# Patient Record
Sex: Female | Born: 1955
Health system: Southern US, Community
[De-identification: ages and names within clinical notes are randomized; demographics above are authoritative.]

## PROBLEM LIST (undated history)

## (undated) DIAGNOSIS — J3089 Other allergic rhinitis: Secondary | ICD-10-CM

## (undated) DIAGNOSIS — I2510 Atherosclerotic heart disease of native coronary artery without angina pectoris: Secondary | ICD-10-CM

## (undated) DIAGNOSIS — D151 Benign neoplasm of heart: Secondary | ICD-10-CM

## (undated) DIAGNOSIS — Z952 Presence of prosthetic heart valve: Secondary | ICD-10-CM

## (undated) DIAGNOSIS — E785 Hyperlipidemia, unspecified: Secondary | ICD-10-CM

## (undated) DIAGNOSIS — K219 Gastro-esophageal reflux disease without esophagitis: Secondary | ICD-10-CM

## (undated) DIAGNOSIS — I10 Essential (primary) hypertension: Secondary | ICD-10-CM

## (undated) DIAGNOSIS — F419 Anxiety disorder, unspecified: Secondary | ICD-10-CM

## (undated) DIAGNOSIS — M1990 Unspecified osteoarthritis, unspecified site: Secondary | ICD-10-CM

## (undated) DIAGNOSIS — M2550 Pain in unspecified joint: Secondary | ICD-10-CM

## (undated) DIAGNOSIS — F32A Depression, unspecified: Secondary | ICD-10-CM

## (undated) DIAGNOSIS — E119 Type 2 diabetes mellitus without complications: Secondary | ICD-10-CM

## (undated) DIAGNOSIS — F329 Major depressive disorder, single episode, unspecified: Secondary | ICD-10-CM

## (undated) DIAGNOSIS — H269 Unspecified cataract: Secondary | ICD-10-CM

## (undated) DIAGNOSIS — R32 Unspecified urinary incontinence: Secondary | ICD-10-CM

## (undated) DIAGNOSIS — Z951 Presence of aortocoronary bypass graft: Secondary | ICD-10-CM

## (undated) HISTORY — DX: Benign neoplasm of heart: D15.1

## (undated) HISTORY — DX: Depression, unspecified: F32.A

## (undated) HISTORY — DX: Atherosclerotic heart disease of native coronary artery without angina pectoris: I25.10

## (undated) HISTORY — PX: EYE SURGERY: SHX253

## (undated) HISTORY — DX: Major depressive disorder, single episode, unspecified: F32.9

## (undated) HISTORY — DX: Unspecified urinary incontinence: R32

## (undated) HISTORY — DX: Anxiety disorder, unspecified: F41.9

## (undated) HISTORY — DX: Pain in unspecified joint: M25.50

## (undated) HISTORY — DX: Presence of prosthetic heart valve: Z95.2

## (undated) HISTORY — DX: Hyperlipidemia, unspecified: E78.5

## (undated) HISTORY — DX: Presence of aortocoronary bypass graft: Z95.1

## (undated) HISTORY — DX: Unspecified cataract: H26.9

## (undated) HISTORY — PX: DIAGNOSTIC LAPAROSCOPY: SUR761

## (undated) HISTORY — DX: Essential (primary) hypertension: I10

---

## 1997-12-14 HISTORY — PX: BREAST EXCISIONAL BIOPSY: SUR124

## 1998-12-11 ENCOUNTER — Encounter: Payer: Self-pay | Admitting: *Deleted

## 1998-12-11 ENCOUNTER — Ambulatory Visit (HOSPITAL_BASED_OUTPATIENT_CLINIC_OR_DEPARTMENT_OTHER): Admission: RE | Admit: 1998-12-11 | Discharge: 1998-12-11 | Payer: Self-pay | Admitting: *Deleted

## 2003-12-15 HISTORY — PX: BREAST LUMPECTOMY: SHX2

## 2007-09-15 ENCOUNTER — Ambulatory Visit: Payer: Self-pay | Admitting: Gastroenterology

## 2010-11-05 ENCOUNTER — Ambulatory Visit: Payer: Self-pay | Admitting: Vascular Surgery

## 2010-11-19 ENCOUNTER — Ambulatory Visit: Payer: Self-pay

## 2011-01-06 ENCOUNTER — Ambulatory Visit: Payer: Self-pay | Admitting: Ophthalmology

## 2011-02-02 ENCOUNTER — Ambulatory Visit: Payer: Self-pay | Admitting: Ophthalmology

## 2011-11-16 ENCOUNTER — Ambulatory Visit: Payer: Self-pay

## 2011-12-15 ENCOUNTER — Ambulatory Visit: Payer: Self-pay

## 2011-12-15 HISTORY — PX: CATARACT EXTRACTION: SUR2

## 2011-12-23 ENCOUNTER — Ambulatory Visit: Payer: Self-pay | Admitting: Family Medicine

## 2012-12-12 ENCOUNTER — Encounter: Payer: Self-pay | Admitting: Physician Assistant

## 2012-12-12 LAB — HM COLONOSCOPY

## 2013-02-16 ENCOUNTER — Ambulatory Visit: Payer: Self-pay | Admitting: Family Medicine

## 2013-07-12 ENCOUNTER — Other Ambulatory Visit: Payer: Self-pay | Admitting: Physician Assistant

## 2013-07-14 LAB — URINE CULTURE

## 2013-09-06 ENCOUNTER — Ambulatory Visit: Payer: Self-pay | Admitting: Family Medicine

## 2013-09-06 LAB — COMPREHENSIVE METABOLIC PANEL
Albumin: 4.1 g/dL (ref 3.4–5.0)
Alkaline Phosphatase: 67 U/L (ref 50–136)
Anion Gap: 6 — ABNORMAL LOW (ref 7–16)
BUN: 20 mg/dL — ABNORMAL HIGH (ref 7–18)
Bilirubin,Total: 0.5 mg/dL (ref 0.2–1.0)
Calcium, Total: 9.2 mg/dL (ref 8.5–10.1)
Chloride: 102 mmol/L (ref 98–107)
Co2: 27 mmol/L (ref 21–32)
Creatinine: 0.8 mg/dL (ref 0.60–1.30)
EGFR (African American): >60
EGFR (Non-African Amer.): >60
Glucose: 123 mg/dL — ABNORMAL HIGH (ref 65–99)
Osmolality: 274 (ref 275–301)
Potassium: 4.4 mmol/L (ref 3.5–5.1)
SGOT(AST): 25 U/L (ref 15–37)
SGPT (ALT): 22 U/L (ref 12–78)
Sodium: 135 mmol/L — ABNORMAL LOW (ref 136–145)
Total Protein: 8.1 g/dL (ref 6.4–8.2)

## 2013-09-06 LAB — LIPID PANEL
Cholesterol: 205 mg/dL — ABNORMAL HIGH (ref 0–200)
HDL Cholesterol: 53 mg/dL (ref 40–60)
Ldl Cholesterol, Calc: 117 mg/dL — ABNORMAL HIGH (ref 0–100)
Triglycerides: 174 mg/dL (ref 0–200)
VLDL Cholesterol, Calc: 35 mg/dL (ref 5–40)

## 2013-09-06 LAB — CK: CK, Total: 120 U/L (ref 21–215)

## 2013-09-06 LAB — HEMOGLOBIN A1C: Hemoglobin A1C: 6.7 % — ABNORMAL HIGH (ref 4.2–6.3)

## 2014-01-18 ENCOUNTER — Ambulatory Visit: Payer: Self-pay | Admitting: Family Medicine

## 2014-02-11 ENCOUNTER — Ambulatory Visit: Payer: Self-pay | Admitting: Family Medicine

## 2014-03-15 ENCOUNTER — Ambulatory Visit: Payer: Self-pay | Admitting: Family Medicine

## 2014-04-13 ENCOUNTER — Ambulatory Visit: Payer: Self-pay | Admitting: Family Medicine

## 2015-03-11 ENCOUNTER — Other Ambulatory Visit: Payer: Self-pay

## 2015-03-11 NOTE — Patient Outreach (Signed)
[facility]) Care Management  [place]  03/11/2015   [name] February 28, 1956 [id number]  Subjective: States blood sugars are generally 118-125mg /dl at 3am when she wakes up.    Objective: Patient did not bring meter.   Current Medications:  Current Outpatient Prescriptions  Medication Sig Dispense Refill  . aspirin 81 MG tablet Take 81 mg by mouth daily.    Marland Kitchen losartan (COZAAR) 25 MG tablet Take 25 mg by mouth daily.    . metFORMIN (GLUCOPHAGE) 1000 MG tablet Take 1,000 mg by mouth 2 (two) times daily with a meal.    . montelukast (SINGULAIR) 10 MG tablet Take 10 mg by mouth daily.    . pravastatin (PRAVACHOL) 40 MG tablet Take 40 mg by mouth daily.    . sertraline (ZOLOFT) 100 MG tablet Take 100 mg by mouth daily.     No current facility-administered medications for this visit.     Fall/Depression Screening: fell when she slipped on a glass of spilled liquid- she was running to get into the bathroom. No injury. Complained of initial pain in right hip when she fell- now it hurts in the right buttock cheek when she sits a certain way.   Assessment: BP 170/90 today and BP 162/80 on 01/23/15 and 178/86 on 11/15/14.  I have asked to to reschedule with [facility] the last time I saw her but she didn't .  Jay is trying to loose weight by eating smaller portions; she's dropped from 215.8 to 210.3lbs since my last visit with her on 01/23/15.   Plan: Instructed patient to follow up with [facility] or the hospital employee PA by March 29, 2015.   Gentry Fitz, RN, BA, [place] Direct Dial:  [phone]  Fax:  [phone] E-mail: [email or web address] [address] [place], [place]  [address]

## 2015-03-11 NOTE — Patient Instructions (Signed)
Hypertension Hypertension, commonly called high blood pressure, is when the force of blood pumping through your arteries is too strong. Your arteries are the blood vessels that carry blood from your heart throughout your body. A blood pressure reading consists of a higher number over a lower number, such as 110/72. The higher number (systolic) is the pressure inside your arteries when your heart pumps. The lower number (diastolic) is the pressure inside your arteries when your heart relaxes. Ideally you want your blood pressure below 120/80. Hypertension forces your heart to work harder to pump blood. Your arteries may become narrow or stiff. Having hypertension puts you at risk for heart disease, stroke, and other problems.  RISK FACTORS Some risk factors for high blood pressure are controllable. Others are not.  Risk factors you cannot control include:   Race. You may be at higher risk if you are African American.  Age. Risk increases with age.  Gender. Men are at higher risk than women before age 45 years. After age 65, women are at higher risk than men. Risk factors you can control include:  Not getting enough exercise or physical activity.  Being overweight.  Getting too much fat, sugar, calories, or salt in your diet.  Drinking too much alcohol. SIGNS AND SYMPTOMS Hypertension does not usually cause signs or symptoms. Extremely high blood pressure (hypertensive crisis) may cause headache, anxiety, shortness of breath, and nosebleed. DIAGNOSIS  To check if you have hypertension, your health care provider will measure your blood pressure while you are seated, with your arm held at the level of your heart. It should be measured at least twice using the same arm. Certain conditions can cause a difference in blood pressure between your right and left arms. A blood pressure reading that is higher than normal on one occasion does not mean that you need treatment. If one blood pressure reading  is high, ask your health care provider about having it checked again. TREATMENT  Treating high blood pressure includes making lifestyle changes and possibly taking medicine. Living a healthy lifestyle can help lower high blood pressure. You may need to change some of your habits. Lifestyle changes may include:  Following the DASH diet. This diet is high in fruits, vegetables, and whole grains. It is low in salt, red meat, and added sugars.  Getting at least 2 hours of brisk physical activity every week.  Losing weight if necessary.  Not smoking.  Limiting alcoholic beverages.  Learning ways to reduce stress. If lifestyle changes are not enough to get your blood pressure under control, your health care provider may prescribe medicine. You may need to take more than one. Work closely with your health care provider to understand the risks and benefits. HOME CARE INSTRUCTIONS  Have your blood pressure rechecked as directed by your health care provider.   Take medicines only as directed by your health care provider. Follow the directions carefully. Blood pressure medicines must be taken as prescribed. The medicine does not work as well when you skip doses. Skipping doses also puts you at risk for problems.   Do not smoke.   Monitor your blood pressure at home as directed by your health care provider. SEEK MEDICAL CARE IF:   You think you are having a reaction to medicines taken.  You have recurrent headaches or feel dizzy.  You have swelling in your ankles.  You have trouble with your vision. SEEK IMMEDIATE MEDICAL CARE IF:  You develop a severe headache or confusion.    You have unusual weakness, numbness, or feel faint.  You have severe chest or abdominal pain.  You vomit repeatedly.  You have trouble breathing. MAKE SURE YOU:   Understand these instructions.  Will watch your condition.  Will get help right away if you are not doing well or get worse. Document  Released: 11/30/2005 Document Revised: 04/16/2014 Document Reviewed: 09/22/2013 [facility] Patient Information 2015 [place], [place]. This information is not intended to replace advice given to you by your health care provider. Make sure you discuss any questions you have with your health care provider. Hypertension Hypertension is another name for high blood pressure. High blood pressure forces your heart to work harder to pump blood. A blood pressure reading has two numbers, which includes a higher number over a lower number (example: 110/72). HOME CARE   Have your blood pressure rechecked by your doctor.  Only take medicine as told by your doctor. Follow the directions carefully. The medicine does not work as well if you skip doses. Skipping doses also puts you at risk for problems.  Do not smoke.  Monitor your blood pressure at home as told by your doctor. GET HELP IF:  You think you are having a reaction to the medicine you are taking.  You have repeat headaches or feel dizzy.  You have puffiness (swelling) in your ankles.  You have trouble with your vision. GET HELP RIGHT AWAY IF:   You get a very bad headache and are confused.  You feel weak, numb, or faint.  You get chest or belly (abdominal) pain.  You throw up (vomit).  You cannot breathe very well. MAKE SURE YOU:   Understand these instructions.  Will watch your condition.  Will get help right away if you are not doing well or get worse. Document Released: 05/18/2008 Document Revised: 12/05/2013 Document Reviewed: 09/22/2013 [facility] Patient Information 2015 [place], [place]. This information is not intended to replace advice given to you by your health care provider. Make sure you discuss any questions you have with your health care provider.

## 2015-04-10 ENCOUNTER — Ambulatory Visit: Payer: Self-pay

## 2015-04-15 ENCOUNTER — Other Ambulatory Visit: Payer: Self-pay

## 2015-04-17 ENCOUNTER — Other Ambulatory Visit: Payer: Self-pay

## 2015-04-17 ENCOUNTER — Emergency Department
Admission: EM | Admit: 2015-04-17 | Discharge: 2015-04-17 | Disposition: A | Discharge status: Home / Self Care | Payer: 59 | Attending: Emergency Medicine | Admitting: Emergency Medicine

## 2015-04-17 DIAGNOSIS — R42 Dizziness and giddiness: Secondary | ICD-10-CM | POA: Diagnosis present

## 2015-04-17 DIAGNOSIS — Z880 Allergy status to penicillin: Secondary | ICD-10-CM | POA: Diagnosis not present

## 2015-04-17 DIAGNOSIS — Z79899 Other long term (current) drug therapy: Secondary | ICD-10-CM | POA: Diagnosis not present

## 2015-04-17 DIAGNOSIS — Z7982 Long term (current) use of aspirin: Secondary | ICD-10-CM | POA: Diagnosis not present

## 2015-04-17 DIAGNOSIS — I10 Essential (primary) hypertension: Secondary | ICD-10-CM | POA: Insufficient documentation

## 2015-04-17 LAB — BASIC METABOLIC PANEL
Anion gap: 7 (ref 5–15)
BUN: 16 mg/dL (ref 6–20)
CO2: 28 mmol/L (ref 22–32)
Calcium: 9.4 mg/dL (ref 8.9–10.3)
Chloride: 102 mmol/L (ref 101–111)
Creatinine, Ser: 0.63 mg/dL (ref 0.44–1.00)
GFR calc Af Amer: >60 mL/min (ref >60)
GFR calc non Af Amer: >60 mL/min (ref >60)
Glucose, Bld: 209 mg/dL — ABNORMAL HIGH (ref 65–99)
Potassium: 4.1 mmol/L (ref 3.5–5.1)
Sodium: 137 mmol/L (ref 135–145)

## 2015-04-17 LAB — GLUCOSE, CAPILLARY: Glucose-Capillary: 187 mg/dL — ABNORMAL HIGH (ref 70–99)

## 2015-04-17 LAB — TROPONIN I: Troponin I: <0.03 ng/mL (ref <0.031)

## 2015-04-17 LAB — CBC
HCT: 41.6 % (ref 35.0–47.0)
Hemoglobin: 14 g/dL (ref 12.0–16.0)
MCH: 28.2 pg (ref 26.0–34.0)
MCHC: 33.7 g/dL (ref 32.0–36.0)
MCV: 83.6 fL (ref 80.0–100.0)
Platelets: 231 10*3/uL (ref 150–440)
RBC: 4.98 MIL/uL (ref 3.80–5.20)
RDW: 12.8 % (ref 11.5–14.5)
WBC: 7.8 10*3/uL (ref 3.6–11.0)

## 2015-04-17 MED ORDER — HYDROCHLOROTHIAZIDE 25 MG PO TABS
25.0000 mg | ORAL_TABLET | Freq: Every day | ORAL | Status: DC
  Administered 2015-04-17: 25 mg via ORAL

## 2015-04-17 MED ORDER — HYDROCHLOROTHIAZIDE 25 MG PO TABS: 25.0000 mg | ORAL_TABLET | Freq: Every day | ORAL | Status: DC

## 2015-04-17 MED ORDER — HYDROCHLOROTHIAZIDE 25 MG PO TABS
ORAL_TABLET | ORAL | Status: AC
  Filled 2015-04-17: qty 1

## 2015-04-17 NOTE — ED Notes (Signed)
Pt states she has had some dizziness with slight headache "for a little while", states today she was at work and the dizziness became worse, states she went to employee health and was advised her BP was elevated, pt denies any cp or sob

## 2015-04-17 NOTE — ED Notes (Signed)
Says she felt dizzy.  Feels like it's from her blood pressure.  Says they changed her bp meds from losartin 25 mg increased to 50 mg.

## 2015-04-17 NOTE — ED Provider Notes (Signed)
[facility] Emergency Department Provider Note    Time seen: 1629  I have reviewed the triage vital signs and the nursing notes.   HISTORY  Chief Complaint Dizziness and Hypertension    HPI [name] is a 59 y.o. female who presents with dizziness on and off for the past week symptoms are mild. Patient states she feels like is from her elevated blood pressure. She recently increased her dose of Lorcet R 10 from 25-50 mg. She has had some dizziness and by dizziness she means a little off balance denies vertigo or lightheadedness. Activity seems to worsen her symptoms, nothing makes it better. Blood pressure has been gradually increasing.     Past Medical History  Diagnosis Date  . Hyperlipidemia   . Hypertension   . Depression   . Joint pain     as reported by patient  . Urinary incontinence     as stated by patient    There are no active problems to display for this patient.   Past Surgical History  Procedure Laterality Date  . Breast lumpectomy Right 2005    as reported by patient  . Cataract extraction  January 2013    Current Outpatient Rx  Name  Route  Sig  Dispense  Refill  . aspirin 81 MG tablet   Oral   Take 81 mg by mouth daily.         Marland Kitchen losartan (COZAAR) 25 MG tablet   Oral   Take 25 mg by mouth daily.         . metFORMIN (GLUCOPHAGE) 1000 MG tablet   Oral   Take 1,000 mg by mouth 2 (two) times daily with a meal.         . montelukast (SINGULAIR) 10 MG tablet   Oral   Take 10 mg by mouth daily.         . pravastatin (PRAVACHOL) 40 MG tablet   Oral   Take 40 mg by mouth daily.         . sertraline (ZOLOFT) 100 MG tablet   Oral   Take 100 mg by mouth daily.           Allergies Penicillins  No family history on file.  Social History History  Substance Use Topics  . Smoking status: Not on file  . Smokeless tobacco: Not on file  . Alcohol Use: Yes    Review of Systems Constitutional:  Negative for fever. Eyes: Negative for visual changes. ENT: Negative for sore throat. Cardiovascular: Negative for chest pain. Respiratory: Negative for shortness of breath. Gastrointestinal: Negative for abdominal pain, vomiting and diarrhea. Genitourinary: Negative for dysuria. Musculoskeletal: Negative for back pain. Skin: Negative for rash. Neurological: Negative for headaches, focal weakness or numbness. Positive for dizziness  10-point ROS otherwise negative.  ____________________________________________   PHYSICAL EXAM:  VITAL SIGNS: ED Triage Vitals  Enc Vitals Group     BP 04/17/15 1206 221/95 mmHg     Pulse Rate 04/17/15 1206 68     Resp 04/17/15 1621 20     Temp 04/17/15 1206 98 F (36.7 C)     Temp Source 04/17/15 1206 Oral     SpO2 04/17/15 1206 100 %     Weight --      Height 04/17/15 1206 5\' 6"  (1.676 m)     Head Cir --      Peak Flow --      Pain Score 04/17/15 1215 0  Pain Loc --      Pain Edu? --      Excl. in [place]? --     Constitutional: Alert and oriented. Well appearing and in no distress. Eyes: Conjunctivae are normal. PERRL. Normal extraocular movements. ENT   Head: Normocephalic and atraumatic.   Nose: No congestion/rhinnorhea.   Mouth/Throat: Mucous membranes are moist.   Neck: No stridor. Hematological/Lymphatic/Immunilogical: No cervical lymphadenopathy. Cardiovascular: Normal rate, regular rhythm. Normal and symmetric distal pulses are present in all extremities. No murmurs, rubs, or gallops. Respiratory: Normal respiratory effort without tachypnea nor retractions. Breath sounds are clear and equal bilaterally. No wheezes/rales/rhonchi. Gastrointestinal: Soft and nontender. No distention. No abdominal bruits. There is no CVA tenderness. Musculoskeletal: Nontender with normal range of motion in all extremities. No joint effusions.  No lower extremity tenderness nor edema. Neurologic:  Normal speech and language. No gross focal  neurologic deficits are appreciated. Speech is normal. No gait instability. Skin:  Skin is warm, dry and intact. No rash noted. Psychiatric: Mood and affect are normal. Speech and behavior are normal. Patient exhibits appropriate insight and judgment.  ____________________________________________    LABS (pertinent positives/negatives)  Blood glucose 187 otherwise labs are normal  ____________________________________________  EKG normal EKG  RADIOLOGY  None  ____________________________________________    ED COURSE  Pertinent labs & imaging results that were available during my care of the patient were reviewed by me and considered in my medical decision making (see chart for details).  Patient hypertensive here, will add HCTZ  FINAL ASSESSMENT AND PLAN  Hypertension and dizziness  Plan: I'll add HCTZ 25 mg to her antihypertensive regimen. She'll continue on losartan 50 mg and follow up with her doctor in the next week for reevaluation.     Earleen Newport, MD   Earleen Newport, MD 04/17/15 [id number]

## 2015-04-17 NOTE — ED Notes (Signed)
Dr Jimmye Norman aware of blood pressure.  Pt can go.   Pt agrees to call pcp in am to inform of situation.

## 2015-04-17 NOTE — Discharge Instructions (Signed)
Dizziness  Dizziness means you feel unsteady or lightheaded. You might feel like you are going to pass out (faint). HOME CARE   Drink enough fluids to keep your pee (urine) clear or pale yellow.  Take your medicines exactly as told by your doctor. If you take blood pressure medicine, always stand up slowly from the lying or sitting position. Hold on to something to steady yourself.  If you need to stand in one place for a long time, move your legs often. Tighten and relax your leg muscles.  Have someone stay with you until you feel okay.  Do not drive or use heavy machinery if you feel dizzy.  Do not drink alcohol. GET HELP RIGHT AWAY IF:   You feel dizzy or lightheaded and it gets worse.  You feel sick to your stomach (nauseous), or you throw up (vomit).  You have trouble talking or walking.  You feel weak or have trouble using your arms, hands, or legs.  You cannot think clearly or have trouble forming sentences.  You have chest pain, belly (abdominal) pain, sweating, or you are short of breath.  Your vision changes.  You are bleeding.  You have problems from your medicine that seem to be getting worse. MAKE SURE YOU:   Understand these instructions.  Will watch your condition.  Will get help right away if you are not doing well or get worse. Document Released: 11/19/2011 Document Revised: 02/22/2012 Document Reviewed: 11/19/2011 [facility] Patient Information 2015 [place], [place]. This information is not intended to replace advice given to you by your health care provider. Make sure you discuss any questions you have with your health care provider.  Hypertension Hypertension is another name for high blood pressure. High blood pressure forces your heart to work harder to pump blood. A blood pressure reading has two numbers, which includes a higher number over a lower number (example: 110/72). HOME CARE   Have your blood pressure rechecked by your doctor.  Only take  medicine as told by your doctor. Follow the directions carefully. The medicine does not work as well if you skip doses. Skipping doses also puts you at risk for problems.  Do not smoke.  Monitor your blood pressure at home as told by your doctor. GET HELP IF:  You think you are having a reaction to the medicine you are taking.  You have repeat headaches or feel dizzy.  You have puffiness (swelling) in your ankles.  You have trouble with your vision. GET HELP RIGHT AWAY IF:   You get a very bad headache and are confused.  You feel weak, numb, or faint.  You get chest or belly (abdominal) pain.  You throw up (vomit).  You cannot breathe very well. MAKE SURE YOU:   Understand these instructions.  Will watch your condition.  Will get help right away if you are not doing well or get worse. Document Released: 05/18/2008 Document Revised: 12/05/2013 Document Reviewed: 09/22/2013 [facility] Patient Information 2015 [place], [place]. This information is not intended to replace advice given to you by your health care provider. Make sure you discuss any questions you have with your health care provider.

## 2015-04-18 ENCOUNTER — Other Ambulatory Visit: Payer: Self-pay

## 2015-04-18 NOTE — Patient Outreach (Signed)
[facility]) Care Management  04/18/2015  [name] 06/25/1956 [id number]   [name] stopped by to see me after going to the pharmacy.  She was put out of work yesterday after she complained of being dizzy and her BP was 200 something over 100+.  She saw her MD and will see her again tomorrow.  I will schedule to follow up with Helene Kelp next month.    Gentry Fitz, RN, BA, [place], [place] Direct Dial:  [phone]  Fax:  [phone] E-mail: [email or web address] [address], [place], [place]  [address]

## 2015-05-15 ENCOUNTER — Other Ambulatory Visit: Payer: Self-pay | Admitting: Family Medicine

## 2015-05-15 ENCOUNTER — Telehealth: Payer: Self-pay

## 2015-05-15 NOTE — Telephone Encounter (Signed)
LMTCB Emily Drozdowski, CMA  

## 2015-05-15 NOTE — Telephone Encounter (Signed)
Pt is returning your call.  ZV#[phone]/SW

## 2015-05-15 NOTE — Telephone Encounter (Signed)
Pt called because [facility] employee pharmacy would "not fill medication because it is too early". Called pharmacy to verify, states that insurance will not pay until 05/27/2015. Pharmacy states this medication will cost $6.21 out of pocket. LMTCB to inform pt. Renaldo Fiddler, CMA

## 2015-05-16 NOTE — Telephone Encounter (Signed)
Informed pt that Metformin is around $6.00 out of pocket. Pt agrees to pay the price. Renaldo Fiddler, CMA

## 2015-05-27 ENCOUNTER — Other Ambulatory Visit: Payer: Self-pay

## 2015-05-27 NOTE — Patient Outreach (Signed)
[facility]) Care Management  [place]  05/27/2015   [name] November 07, 1956 [id number]  Subjective:  Patient tells me of her recent visit to the ED for c/o dizziness.  She was started on HCTZ and Losartan increased to 50mg /day and was told to see her primary care MD- unsure whether this has been done.  She stopped her aspirin for an unknown reason.  Also needs a full physical. Patient continues to complain of still feeling dizzy and states BP are 190/90 but improve through the day, saying they are sometimes 140/60 in the afternoon.  She has a BP cuff at home and has compared it to employee health and it appears to be accurate.  Patient did not bring her meter and reports only checking blood sugars every other day.     Objective:  Filed Vitals:   05/27/15 1143  BP: 140/90   Patient is not keeping up with preventative care appointments. Needs a physical, dilated eye, dental and mammogram. She has broken her partial plate and needs it fixed.  She tells me she has a cataract in her left eye that needs to be removed. She tells me she thinks she can schedule these over the next few months.    Current Medications:  Current Outpatient Prescriptions  Medication Sig Dispense Refill  . ALPRAZolam (XANAX) 0.5 MG tablet Take 0.5 mg by mouth every 8 (eight) hours. 1/2 to 1 tab before work and q8h as needed    . hydrochlorothiazide (HYDRODIURIL) 25 MG tablet Take 1 tablet (25 mg total) by mouth daily. 30 tablet 1  . losartan (COZAAR) 25 MG tablet Take 50 mg by mouth daily.     . metFORMIN (GLUCOPHAGE) 1000 MG tablet Take 1,000 mg by mouth 2 (two) times daily with a meal.    . montelukast (SINGULAIR) 10 MG tablet Take 10 mg by mouth daily.    . naproxen sodium (ANAPROX) 220 MG tablet Take 220 mg by mouth as needed.    . pravastatin (PRAVACHOL) 40 MG tablet Take 40 mg by mouth daily.    . sertraline (ZOLOFT) 100 MG tablet Take 100 mg by mouth daily.    Marland Kitchen aspirin 81 MG  tablet Take 81 mg by mouth daily.     No current facility-administered medications for this visit.    Functional Status:  In your present state of health, do you have any difficulty performing the following activities: 05/27/2015  Hearing? N  Vision? N  Difficulty concentrating or making decisions? N  Walking or climbing stairs? N  Dressing or bathing? N  Doing errands, shopping? N    Fall/Depression Screening: PHQ 2/9 Scores 05/27/2015  PHQ - 2 Score 0    Plan: Schedule preventative care over the next few months.  Schedule visit with MD as soon as possible- needs BP meds assessed.  Some recent labs done in the ED but needs an A1C with her physical.  Recommended she sign up for My Chart to review her labs.   Education - hypertension   -diabetes eye exams   - why get your A1C checked  Follow up scheduled for August, 2016  Gentry Fitz, RN, [place], [place], [place]:  [phone]  Fax:  [phone] E-mail: [email or web address] [address] [place], [place]  [address]

## 2015-05-27 NOTE — Patient Instructions (Addendum)

## 2015-06-18 ENCOUNTER — Other Ambulatory Visit: Payer: Self-pay | Admitting: Psychiatry

## 2015-06-26 ENCOUNTER — Other Ambulatory Visit: Payer: Self-pay | Admitting: Physician Assistant

## 2015-06-26 DIAGNOSIS — I10 Essential (primary) hypertension: Secondary | ICD-10-CM

## 2015-06-26 MED ORDER — HYDROCHLOROTHIAZIDE 25 MG PO TABS: 25.0000 mg | ORAL_TABLET | Freq: Every day | ORAL | Status: DC

## 2015-06-26 NOTE — Telephone Encounter (Signed)
Rx refilled but needs appt before any more refills.

## 2015-06-26 NOTE — Telephone Encounter (Signed)
Pt called.  She was given a RX in the ER  hydrochlorothiazide  25 mg .  She is out and needs a refill.  She uses the pharmacy at [name] .  Her call back is  [phone]  Thanks, TP.

## 2015-06-27 NOTE — Telephone Encounter (Signed)
Patient advised as directed below. Patient verbalized understanding. Patient states she will call back for a follow up appointment.

## 2015-08-01 ENCOUNTER — Encounter: Payer: Self-pay | Admitting: Physician Assistant

## 2015-08-01 ENCOUNTER — Ambulatory Visit (INDEPENDENT_AMBULATORY_CARE_PROVIDER_SITE_OTHER): Payer: 59 | Admitting: Physician Assistant

## 2015-08-01 VITALS — BP 178/78 | HR 80 | Temp 98.2°F | Resp 16 | Wt 217.0 lb

## 2015-08-01 DIAGNOSIS — K5730 Diverticulosis of large intestine without perforation or abscess without bleeding: Secondary | ICD-10-CM | POA: Insufficient documentation

## 2015-08-01 DIAGNOSIS — F32A Depression, unspecified: Secondary | ICD-10-CM | POA: Insufficient documentation

## 2015-08-01 DIAGNOSIS — F329 Major depressive disorder, single episode, unspecified: Secondary | ICD-10-CM | POA: Insufficient documentation

## 2015-08-01 DIAGNOSIS — F430 Acute stress reaction: Secondary | ICD-10-CM | POA: Insufficient documentation

## 2015-08-01 DIAGNOSIS — E119 Type 2 diabetes mellitus without complications: Secondary | ICD-10-CM | POA: Insufficient documentation

## 2015-08-01 DIAGNOSIS — I10 Essential (primary) hypertension: Secondary | ICD-10-CM | POA: Insufficient documentation

## 2015-08-01 DIAGNOSIS — F419 Anxiety disorder, unspecified: Secondary | ICD-10-CM | POA: Diagnosis not present

## 2015-08-01 DIAGNOSIS — E7800 Pure hypercholesterolemia, unspecified: Secondary | ICD-10-CM

## 2015-08-01 DIAGNOSIS — E785 Hyperlipidemia, unspecified: Secondary | ICD-10-CM

## 2015-08-01 DIAGNOSIS — E559 Vitamin D deficiency, unspecified: Secondary | ICD-10-CM | POA: Insufficient documentation

## 2015-08-01 DIAGNOSIS — N393 Stress incontinence (female) (male): Secondary | ICD-10-CM | POA: Insufficient documentation

## 2015-08-01 DIAGNOSIS — E669 Obesity, unspecified: Secondary | ICD-10-CM | POA: Insufficient documentation

## 2015-08-01 DIAGNOSIS — N3281 Overactive bladder: Secondary | ICD-10-CM | POA: Insufficient documentation

## 2015-08-01 DIAGNOSIS — L299 Pruritus, unspecified: Secondary | ICD-10-CM | POA: Insufficient documentation

## 2015-08-01 DIAGNOSIS — G4700 Insomnia, unspecified: Secondary | ICD-10-CM | POA: Insufficient documentation

## 2015-08-01 MED ORDER — LOSARTAN POTASSIUM 25 MG PO TABS: 25.0000 mg | ORAL_TABLET | Freq: Two times a day (BID) | ORAL | Status: DC

## 2015-08-01 MED ORDER — HYDROCHLOROTHIAZIDE 25 MG PO TABS: 25.0000 mg | ORAL_TABLET | Freq: Every day | ORAL | Status: DC

## 2015-08-01 MED ORDER — METFORMIN HCL 1000 MG PO TABS: 1000.0000 mg | ORAL_TABLET | Freq: Two times a day (BID) | ORAL | Status: DC

## 2015-08-01 MED ORDER — ALPRAZOLAM 0.5 MG PO TABS: 0.5000 mg | ORAL_TABLET | Freq: Three times a day (TID) | ORAL | Status: DC

## 2015-08-01 NOTE — Progress Notes (Signed)
Subjective:     Patient ID: [name], female   DOB: [birth year], 59 y.o.   MRN: [id number]  HPI  Hypertension, follow-up:  BP Readings from Last 3 Encounters:  08/01/15 178/78  04/19/15 150/76  05/27/15 140/90    She was last seen for hypertension 2 months ago.  BP at that visit was as above. Management changes since that visit include increased losartan and then HCTZ 25mg  was added at ER visit. She reports good compliance with treatment. She is not having side effects.  She is not exercising. She is not adherent to low salt diet.   Outside blood pressures are not being recorded, but she states it will be high when she wakes up but comes down to normal after she takes her medications. She is experiencing none.  Patient denies chest pain, chest pressure/discomfort, claudication, dyspnea, exertional chest pressure/discomfort, irregular heart beat, lower extremity edema, near-syncope, orthopnea, palpitations, paroxysmal nocturnal dyspnea, syncope and tachypnea.   Cardiovascular risk factors include diabetes mellitus, dyslipidemia, hypertension and sedentary lifestyle.  Use of agents associated with hypertension: none.     Weight trend: stable Wt Readings from Last 3 Encounters:  08/01/15 217 lb (98.431 kg)  05/27/15 208 lb (94.348 kg)    Current diet: in general, an "unhealthy" diet  ------------------------------------------------------------------------     Review of Systems  Constitutional: Positive for fatigue.  HENT: Negative.   Eyes: Negative.   Respiratory: Negative.   Cardiovascular: Negative.   Gastrointestinal: Negative.   Endocrine: Negative.   Genitourinary: Negative.   Musculoskeletal: Negative.   Skin: Negative.   Allergic/Immunologic: Negative.   Neurological: Negative.   Hematological: Negative.   Psychiatric/Behavioral: The patient is nervous/anxious.        Objective:   Physical Exam  Constitutional: She appears well-developed and  well-nourished. No distress.  Cardiovascular: Normal rate, regular rhythm and normal heart sounds.  Exam reveals no gallop and no friction rub.   No murmur heard. Pulmonary/Chest: Effort normal and breath sounds normal. No respiratory distress. She has no wheezes. She has no rales.  Skin: She is not diaphoretic.  Vitals reviewed.      Assessment:     1. Essential hypertension   2. Type 2 diabetes mellitus without complication   3. Hypercholesterolemia without hypertriglyceridemia   4. Anxiety        Plan:     1. Essential hypertension Patient's blood pressure is still elevated today in the office. She does state that she does get good control at home when she takes her medication but does sometimes forget to take it every day. She states that when she wakes up in the morning her blood pressure will be elevated then she will take her medication when she rechecks her blood pressure it will come back to normal ranges. She has not kept a record of any of these numbers and did not bring any with her today. I did advise her to try to keep a log so that I could determine if her blood pressures were stable at home. Due to her blood pressure being elevated when she awakes I will change her losartan to 25 mg twice daily instead of 50 mg once. She is to continue the hydrochlorothiazide as prescribed 25 mg. I will also get labs today. I will see her back in approximately 4 weeks for her physical and we'll see how her blood pressures have been done. If they're still elevated will add another agent. - CBC with Differential - Comprehensive Metabolic Panel (  CMET) - hydrochlorothiazide (HYDRODIURIL) 25 MG tablet; Take 1 tablet (25 mg total) by mouth daily.  Dispense: 30 tablet; Refill: 6 - losartan (COZAAR) 25 MG tablet; Take 1 tablet (25 mg total) by mouth 2 (two) times daily.  Dispense: 60 tablet; Refill: 6  2. Type 2 diabetes mellitus without complication She is [facility]  for her diabetes. I will check her hemoglobin A1c today as a has not been checked in quite a while. She is to return in 4 weeks for her physical. At that time we will check her microalbumin and her diabetic foot exam. - HgB A1c - Comprehensive Metabolic Panel (CMET) - metFORMIN (GLUCOPHAGE) 1000 MG tablet; Take 1 tablet (1,000 mg total) by mouth 2 (two) times daily with a meal.  Dispense: 60 tablet; Refill: 6  3. Hypercholesterolemia without hypertriglyceridemia Will check labs. She will follow up in 4 weeks for her annual physical. Will add cholesterol-lowering medication if necessary. - Lipid panel  4. Anxiety Most related to work stress. She states she only takes half a pill when she needs it prior to work. Only on occasion will she take 1 whole pill. She also has some increased anxiety over her grandson starting kindergarten this year. Overall it is fairly stable. - TSH - ALPRAZolam (XANAX) 0.5 MG tablet; Take 1 tablet (0.5 mg total) by mouth every 8 (eight) hours. 1/2 to 1 tab before work and q8h as needed  Dispense: 30 tablet; Refill: 1

## 2015-08-01 NOTE — Patient Instructions (Signed)
Hypertension °Hypertension, commonly called high blood pressure, is when the force of blood pumping through your arteries is too strong. Your arteries are the blood vessels that carry blood from your heart throughout your body. A blood pressure reading consists of a higher number over a lower number, such as 110/72. The higher number (systolic) is the pressure inside your arteries when your heart pumps. The lower number (diastolic) is the pressure inside your arteries when your heart relaxes. Ideally you want your blood pressure below 120/80. °Hypertension forces your heart to work harder to pump blood. Your arteries may become narrow or stiff. Having hypertension puts you at risk for heart disease, stroke, and other problems.  °RISK FACTORS °Some risk factors for high blood pressure are controllable. Others are not.  °Risk factors you cannot control include:  °· Race. You may be at higher risk if you are African American. °· Age. Risk increases with age. °· Gender. Men are at higher risk than women before age 45 years. After age 65, women are at higher risk than men. °Risk factors you can control include: °· Not getting enough exercise or physical activity. °· Being overweight. °· Getting too much fat, sugar, calories, or salt in your diet. °· Drinking too much alcohol. °SIGNS AND SYMPTOMS °Hypertension does not usually cause signs or symptoms. Extremely high blood pressure (hypertensive crisis) may cause headache, anxiety, shortness of breath, and nosebleed. °DIAGNOSIS  °To check if you have hypertension, your health care provider will measure your blood pressure while you are seated, with your arm held at the level of your heart. It should be measured at least twice using the same arm. Certain conditions can cause a difference in blood pressure between your right and left arms. A blood pressure reading that is higher than normal on one occasion does not mean that you need treatment. If one blood pressure reading  is high, ask your health care provider about having it checked again. °TREATMENT  °Treating high blood pressure includes making lifestyle changes and possibly taking medicine. Living a healthy lifestyle can help lower high blood pressure. You may need to change some of your habits. °Lifestyle changes may include: °· Following the DASH diet. This diet is high in fruits, vegetables, and whole grains. It is low in salt, red meat, and added sugars. °· Getting at least 2½ hours of brisk physical activity every week. °· Losing weight if necessary. °· Not smoking. °· Limiting alcoholic beverages. °· Learning ways to reduce stress. ° If lifestyle changes are not enough to get your blood pressure under control, your health care provider may prescribe medicine. You may need to take more than one. Work closely with your health care provider to understand the risks and benefits. °HOME CARE INSTRUCTIONS °· Have your blood pressure rechecked as directed by your health care provider.   °· Take medicines only as directed by your health care provider. Follow the directions carefully. Blood pressure medicines must be taken as prescribed. The medicine does not work as well when you skip doses. Skipping doses also puts you at risk for problems.   °· Do not smoke.   °· Monitor your blood pressure at home as directed by your health care provider.  °SEEK MEDICAL CARE IF:  °· You think you are having a reaction to medicines taken. °· You have recurrent headaches or feel dizzy. °· You have swelling in your ankles. °· You have trouble with your vision. °SEEK IMMEDIATE MEDICAL CARE IF: °· You develop a severe headache or confusion. °·   You have unusual weakness, numbness, or feel faint. °· You have severe chest or abdominal pain. °· You vomit repeatedly. °· You have trouble breathing. °MAKE SURE YOU:  °· Understand these instructions. °· Will watch your condition. °· Will get help right away if you are not doing well or get worse. °Document  Released: 11/30/2005 Document Revised: 04/16/2014 Document Reviewed: 09/22/2013 °ExitCare® Patient Information ©2015 [employer]. This information is not intended to replace advice given to you by your health care provider. Make sure you discuss any questions you have with your health care provider. ° °Managing Your High Blood Pressure °Blood pressure is a measurement of how forceful your blood is pressing against the walls of the arteries. Arteries are muscular tubes within the circulatory system. Blood pressure does not stay the same. Blood pressure rises when you are active, excited, or nervous; and it lowers during sleep and relaxation. If the numbers measuring your blood pressure stay above normal most of the time, you are at risk for health problems. High blood pressure (hypertension) is a long-term (chronic) condition in which blood pressure is elevated. °A blood pressure reading is recorded as two numbers, such as 120 over 80 (or 120/80). The first, higher number is called the systolic pressure. It is a measure of the pressure in your arteries as the heart beats. The second, lower number is called the diastolic pressure. It is a measure of the pressure in your arteries as the heart relaxes between beats.  °Keeping your blood pressure in a normal range is important to your overall health and prevention of health problems, such as heart disease and stroke. When your blood pressure is uncontrolled, your heart has to work harder than normal. High blood pressure is a very common condition in adults because blood pressure tends to rise with age. Men and women are equally likely to have hypertension but at different times in life. Before age 45, men are more likely to have hypertension. After 61 years of age, women are more likely to have it. Hypertension is especially common in African Americans. This condition often has no signs or symptoms. The cause of the condition is usually not known. Your caregiver can  help you come up with a plan to keep your blood pressure in a normal, healthy range. °BLOOD PRESSURE STAGES °Blood pressure is classified into four stages: normal, prehypertension, stage 1, and stage 2. Your blood pressure reading will be used to determine what type of treatment, if any, is necessary. Appropriate treatment options are tied to these four stages:  °Normal °· Systolic pressure (mm Hg): below 120. °· Diastolic pressure (mm Hg): below 80. °Prehypertension °· Systolic pressure (mm Hg): 120 to 139. °· Diastolic pressure (mm Hg): 80 to 89. °Stage 1 °· Systolic pressure (mm Hg): 140 to 159. °· Diastolic pressure (mm Hg): 90 to 99. °Stage 2 °· Systolic pressure (mm Hg): 160 or above. °· Diastolic pressure (mm Hg): 100 or above. °RISKS RELATED TO HIGH BLOOD PRESSURE °Managing your blood pressure is an important responsibility. Uncontrolled high blood pressure can lead to: °· A heart attack. °· A stroke. °· A weakened blood vessel (aneurysm). °· Heart failure. °· Kidney damage. °· Eye damage. °· Metabolic syndrome. °· Memory and concentration problems. °HOW TO MANAGE YOUR BLOOD PRESSURE °Blood pressure can be managed effectively with lifestyle changes and medicines (if needed). Your caregiver will help you come up with a plan to bring your blood pressure within a normal range. Your plan should include the following: °Education °·   Read all information provided by your caregivers about how to control blood pressure. °· Educate yourself on the latest guidelines and treatment recommendations. New research is always being done to further define the risks and treatments for high blood pressure. °Lifestyle changes °· Control your weight. °· Avoid smoking. °· Stay physically active. °· Reduce the amount of salt in your diet. °· Reduce stress. °· Control any chronic conditions, such as high cholesterol or diabetes. °· Reduce your alcohol intake. °Medicines °· Several medicines (antihypertensive medicines) are available,  if needed, to bring blood pressure within a normal range. °Communication °· Review all the medicines you take with your caregiver because there may be side effects or interactions. °· Talk with your caregiver about your diet, exercise habits, and other lifestyle factors that may be contributing to high blood pressure. °· See your caregiver regularly. Your caregiver can help you create and adjust your plan for managing high blood pressure. °RECOMMENDATIONS FOR TREATMENT AND FOLLOW-UP  °The following recommendations are based on current guidelines for managing high blood pressure in nonpregnant adults. Use these recommendations to identify the proper follow-up period or treatment option based on your blood pressure reading. You can discuss these options with your caregiver. °· Systolic pressure of 120 to 139 or diastolic pressure of 80 to 89: Follow up with your caregiver as directed. °· Systolic pressure of 140 to 160 or diastolic pressure of 90 to 100: Follow up with your caregiver within 2 months. °· Systolic pressure above 160 or diastolic pressure above 100: Follow up with your caregiver within 1 month. °· Systolic pressure above 180 or diastolic pressure above 110: Consider antihypertensive therapy; follow up with your caregiver within 1 week. °· Systolic pressure above 200 or diastolic pressure above 120: Begin antihypertensive therapy; follow up with your caregiver within 1 week. °Document Released: 08/24/2012 Document Reviewed: 08/24/2012 °ExitCare® Patient Information ©2015 [employer]. This information is not intended to replace advice given to you by your health care provider. Make sure you discuss any questions you have with your health care provider. ° °

## 2015-08-05 ENCOUNTER — Ambulatory Visit: Payer: 59

## 2015-08-05 ENCOUNTER — Other Ambulatory Visit: Payer: Self-pay

## 2015-08-05 NOTE — Patient Outreach (Signed)
[facility] [address]) Care Management  08/05/2015  MALALA TRENKAMP 07-07-1956 [id number]     Left message on the patient's phone to call me and schedule her missed appointment.   Gentry Fitz, RN, BA, [place], [place] Direct Dial:  [phone]  Fax:  [phone] E-mail: [email or web address] [address], [place], [place]  [address]

## 2015-08-05 NOTE — Patient Outreach (Signed)
[facility]) Care Management  08/05/2015  [name] 06-06-1956 [id number]   Spoke to Helene Kelp- she went to see her MD last week and has a lab slip for an A1C which she will get tomorrow.  She has a scheduled appointment with her MD on September 1 for a full physical- she will get a referral for a mammogram at that time.   I have scheduled her to see me on 09/11/15 and have notified the patient.  Gentry Fitz, RN, BA, [place], [place] Direct Dial:  [phone]  Fax:  [phone] E-mail: [email or web address] [address], [place], [place]  [address]

## 2015-08-05 NOTE — Patient Outreach (Signed)
[facility]) Care Management  08/05/2015  [name] Dec 29, 1955 [id number]   Patient did not show for her scheduled visit.   Gentry Fitz, RN, BA, [place], [place] Direct Dial:  [phone]  Fax:  [phone] E-mail: [name] [address] [place], [place]  [address]

## 2015-08-06 ENCOUNTER — Other Ambulatory Visit
Admission: RE | Admit: 2015-08-06 | Discharge: 2015-08-06 | Disposition: A | Discharge status: Home / Self Care | Payer: 59 | Source: Ambulatory Visit | Attending: Physician Assistant | Admitting: Physician Assistant

## 2015-08-06 DIAGNOSIS — I10 Essential (primary) hypertension: Secondary | ICD-10-CM | POA: Insufficient documentation

## 2015-08-06 DIAGNOSIS — E119 Type 2 diabetes mellitus without complications: Secondary | ICD-10-CM | POA: Diagnosis present

## 2015-08-06 DIAGNOSIS — E780 Pure hypercholesterolemia: Secondary | ICD-10-CM | POA: Diagnosis not present

## 2015-08-06 DIAGNOSIS — F419 Anxiety disorder, unspecified: Secondary | ICD-10-CM | POA: Diagnosis present

## 2015-08-06 LAB — COMPREHENSIVE METABOLIC PANEL
ALT: 17 U/L (ref 14–54)
AST: 26 U/L (ref 15–41)
Albumin: 4.4 g/dL (ref 3.5–5.0)
Alkaline Phosphatase: 55 U/L (ref 38–126)
Anion gap: 8 (ref 5–15)
BUN: 16 mg/dL (ref 6–20)
CO2: 29 mmol/L (ref 22–32)
Calcium: 9.4 mg/dL (ref 8.9–10.3)
Chloride: 99 mmol/L — ABNORMAL LOW (ref 101–111)
Creatinine, Ser: 0.78 mg/dL (ref 0.44–1.00)
GFR calc Af Amer: >60 mL/min (ref >60)
GFR calc non Af Amer: >60 mL/min (ref >60)
Glucose, Bld: 188 mg/dL — ABNORMAL HIGH (ref 65–99)
Potassium: 4.5 mmol/L (ref 3.5–5.1)
Sodium: 136 mmol/L (ref 135–145)
Total Bilirubin: 0.4 mg/dL (ref 0.3–1.2)
Total Protein: 8.1 g/dL (ref 6.5–8.1)

## 2015-08-06 LAB — LIPID PANEL
Cholesterol: 243 mg/dL — ABNORMAL HIGH (ref 0–200)
HDL: 48 mg/dL (ref >40)
LDL Cholesterol: 152 mg/dL — ABNORMAL HIGH (ref 0–99)
Total CHOL/HDL Ratio: 5.1 RATIO
Triglycerides: 214 mg/dL — ABNORMAL HIGH (ref <150)
VLDL: 43 mg/dL — ABNORMAL HIGH (ref 0–40)

## 2015-08-06 LAB — CBC WITH DIFFERENTIAL/PLATELET
Basophils Absolute: 0 10*3/uL (ref 0–0.1)
Basophils Relative: 1 %
Eosinophils Absolute: 0.2 10*3/uL (ref 0–0.7)
Eosinophils Relative: 4 %
HCT: 41.7 % (ref 35.0–47.0)
Hemoglobin: 14 g/dL (ref 12.0–16.0)
Lymphocytes Relative: 22 %
Lymphs Abs: 1.3 10*3/uL (ref 1.0–3.6)
MCH: 28.1 pg (ref 26.0–34.0)
MCHC: 33.7 g/dL (ref 32.0–36.0)
MCV: 83.4 fL (ref 80.0–100.0)
Monocytes Absolute: 0.5 10*3/uL (ref 0.2–0.9)
Monocytes Relative: 8 %
Neutro Abs: 4.1 10*3/uL (ref 1.4–6.5)
Neutrophils Relative %: 65 %
Platelets: 219 10*3/uL (ref 150–440)
RBC: 5 MIL/uL (ref 3.80–5.20)
RDW: 13.1 % (ref 11.5–14.5)
WBC: 6.2 10*3/uL (ref 3.6–11.0)

## 2015-08-06 LAB — TSH: TSH: 4.229 u[IU]/mL (ref 0.350–4.500)

## 2015-08-06 LAB — HEMOGLOBIN A1C: Hgb A1c MFr Bld: 8.6 % — ABNORMAL HIGH (ref 4.0–6.0)

## 2015-08-07 ENCOUNTER — Telehealth: Payer: Self-pay

## 2015-08-07 NOTE — Telephone Encounter (Signed)
LMTCB

## 2015-08-07 NOTE — Telephone Encounter (Signed)
-----   Message from Mar Daring, [place] sent at 08/07/2015  8:28 AM EDT ----- Can we schedule her for a f/u to discuss her labs.  Her HgBA1c is now increased to 8.6 and I need to add medication to get this level down.  We also need to discuss her cholesterol and possibly adjusting that as well.  Thanks.

## 2015-08-07 NOTE — Telephone Encounter (Signed)
Ok that works.  Thank you.

## 2015-08-07 NOTE — Telephone Encounter (Signed)
Patient advised as directed below. Patient states she has a follow up appointment scheduled for 08/15/15

## 2015-08-15 ENCOUNTER — Encounter: Payer: 59 | Admitting: Physician Assistant

## 2015-08-29 ENCOUNTER — Encounter: Payer: Self-pay | Admitting: Physician Assistant

## 2015-08-29 ENCOUNTER — Ambulatory Visit (INDEPENDENT_AMBULATORY_CARE_PROVIDER_SITE_OTHER): Payer: 59 | Admitting: Physician Assistant

## 2015-08-29 VITALS — BP 120/70 | HR 70 | Temp 98.1°F | Resp 16 | Ht 66.0 in | Wt 214.8 lb

## 2015-08-29 DIAGNOSIS — Z23 Encounter for immunization: Secondary | ICD-10-CM | POA: Diagnosis not present

## 2015-08-29 DIAGNOSIS — F329 Major depressive disorder, single episode, unspecified: Secondary | ICD-10-CM | POA: Diagnosis not present

## 2015-08-29 DIAGNOSIS — Z0000 Encounter for general adult medical examination without abnormal findings: Secondary | ICD-10-CM | POA: Diagnosis not present

## 2015-08-29 DIAGNOSIS — Z1239 Encounter for other screening for malignant neoplasm of breast: Secondary | ICD-10-CM

## 2015-08-29 DIAGNOSIS — F32A Depression, unspecified: Secondary | ICD-10-CM

## 2015-08-29 DIAGNOSIS — E7800 Pure hypercholesterolemia, unspecified: Secondary | ICD-10-CM

## 2015-08-29 MED ORDER — SERTRALINE HCL 100 MG PO TABS: 100.0000 mg | ORAL_TABLET | Freq: Every day | ORAL | Status: DC

## 2015-08-29 MED ORDER — PRAVASTATIN SODIUM 40 MG PO TABS: 40.0000 mg | ORAL_TABLET | Freq: Every day | ORAL | Status: DC

## 2015-08-29 NOTE — Progress Notes (Signed)
Patient: [name], Female    DOB: [birth year], 59 y.o.   MRN: [id number] Visit Date: 08/29/2015  Today's Provider: Mar Daring, PA-C   Chief Complaint  Patient presents with  . Annual Exam   Subjective:    Annual physical exam [name] is a 58 y.o. female who presents today for health maintenance and complete physical. She feels well. She reports exercising, sometimes. She reports she is sleeping poorly. She had a Pap smear last year which was normal. There is no family history of cervical or ovarian cancer. Mammogram last year was also normal there is no family history of breast cancer. Last colonoscopy was in 2008 and was to be repeated in 10 years. There is a positive family history of colon cancer in her father. She denies any bowel changes, melena or hematochezia.   Last PCP: 03/15/2014 Pap Smear: Normal, HPV Negative 03/15/14 Colonoscopy: 09/15/2007 Diverticulosis; Repeat 10 years Mammogram: 03/2014 Normal -----------------------------------------------------------------   Review of Systems  Constitutional: Negative.   HENT: Negative.   Eyes: Negative.   Respiratory: Negative.   Cardiovascular: Negative.   Gastrointestinal: Negative.   Endocrine: Negative.   Musculoskeletal: Positive for back pain and arthralgias.  Skin: Negative.   Allergic/Immunologic: Negative.   Neurological: Positive for dizziness.  Hematological: Negative.   Psychiatric/Behavioral: Positive for sleep disturbance and agitation.    Social History She  reports that she has quit smoking. Her smoking use included Cigarettes. She has a 37.5 pack-year smoking history. She has never used smokeless tobacco. She reports that she drinks alcohol. She reports that she does not use illicit drugs. Social History   Social History  . Marital Status: Widowed    Spouse Name: N/A  . Number of Children: N/A  . Years of Education: N/A   Social History Main Topics  . Smoking  status: Former Smoker -- 1.50 packs/day for 25 years    Types: Cigarettes  . Smokeless tobacco: Never Used  . Alcohol Use: 0.0 oz/week    0 Standard drinks or equivalent per week     Comment: 1- every 3-4 months  . Drug Use: No  . Sexual Activity: Not Asked   Other Topics Concern  . None   Social History Narrative    Patient Active Problem List   Diagnosis Date Noted  . Acute stress disorder 08/01/2015  . Anxiety 08/01/2015  . Clinical depression 08/01/2015  . Diabetes 08/01/2015  . Diverticulosis of colon 08/01/2015  . Generalized pruritus 08/01/2015  . BP (high blood pressure) 08/01/2015  . Cannot sleep 08/01/2015  . Adiposity 08/01/2015  . Detrusor muscle hypertonia 08/01/2015  . Hypercholesterolemia without hypertriglyceridemia 08/01/2015  . Female stress incontinence 08/01/2015  . Avitaminosis D 08/01/2015    Past Surgical History  Procedure Laterality Date  . Breast lumpectomy Right 2005    as reported by patient  . Cataract extraction  January 2013    Family History  Family Status  Relation Status Death Age  . Brother Alive   . Mother Deceased   . Father Deceased   . Maternal Grandmother Deceased   . Maternal Grandfather Deceased   . Paternal Grandmother Deceased   . Paternal Grandfather Deceased    Her family history includes Alzheimer's disease in her paternal grandmother; Aneurysm in her mother; CAD in her father; Colon cancer in her father; Dementia in her paternal grandmother; Diabetes in her father; Healthy in her brother; Heart failure in her father; Mental illness in her  paternal grandfather.    Allergies  Allergen Reactions  . Penicillins   . Tetanus-Diphtheria Toxoids Td     Previous Medications   ALPRAZOLAM (XANAX) 0.5 MG TABLET    Take 1 tablet (0.5 mg total) by mouth every 8 (eight) hours. 1/2 to 1 tab before work and q8h as needed   ASPIRIN 81 MG TABLET    Take 81 mg by mouth daily.   CALCIUM CARBONATE-VIT D-MIN PO    Take 1 tablet by  mouth daily.   CHOLECALCIFEROL (VITAMIN D) 2000 UNITS CAPS    Take 4 capsules by mouth daily.   HYDROCHLOROTHIAZIDE (HYDRODIURIL) 25 MG TABLET    Take 1 tablet (25 mg total) by mouth daily.   HYDROXYZINE (ATARAX/VISTARIL) 25 MG TABLET    Take 1 tablet by mouth 3 (three) times daily as needed.   LORATADINE (CLARITIN) 10 MG TABLET    Take 1 tablet by mouth daily.   LOSARTAN (COZAAR) 25 MG TABLET    Take 1 tablet (25 mg total) by mouth 2 (two) times daily.   METFORMIN (GLUCOPHAGE) 1000 MG TABLET    Take 1 tablet (1,000 mg total) by mouth 2 (two) times daily with a meal.   MONTELUKAST (SINGULAIR) 10 MG TABLET    Take 10 mg by mouth daily.   NAPROXEN SODIUM (ANAPROX) 220 MG TABLET    Take 220 mg by mouth as needed.   PRAVASTATIN (PRAVACHOL) 40 MG TABLET    Take 40 mg by mouth daily.   PSYLLIUM (REGULOID) 0.52 G CAPSULE    Take 1 capsule by mouth as directed.   SERTRALINE (ZOLOFT) 100 MG TABLET    Take 100 mg by mouth daily.   TOLTERODINE (DETROL LA) 2 MG 24 HR CAPSULE    Take 1 capsule by mouth daily.    Patient Care Team: Mar Daring, PA-C as PCP - General (Physician Assistant) Pollyann Glen, RN as Registered Nurse     Objective:   Vitals: BP 120/70 mmHg  Pulse 70  Temp(Src) 98.1 F (36.7 C) (Oral)  Resp 16  Ht 5\' 6"  (1.676 m)  Wt 214 lb 12.8 oz (97.433 kg)  BMI 34.69 kg/m2  LMP  (Approximate)   Physical Exam  Constitutional: She is oriented to person, place, and time. She appears well-developed and well-nourished. No distress.  HENT:  Head: Normocephalic and atraumatic.  Right Ear: External ear normal.  Left Ear: External ear normal.  Nose: Nose normal.  Mouth/Throat: Oropharynx is clear and moist. No oropharyngeal exudate.  Eyes: Conjunctivae and EOM are normal. Pupils are equal, round, and reactive to light. Right eye exhibits no discharge. Left eye exhibits no discharge. No scleral icterus.  Neck: Normal range of motion. Neck supple. No JVD present. No tracheal  deviation present. No thyromegaly present.  Cardiovascular: Normal rate, regular rhythm, normal heart sounds and intact distal pulses.  Exam reveals no gallop and no friction rub.   No murmur heard. Pulmonary/Chest: Effort normal and breath sounds normal. No respiratory distress. She has no wheezes. She has no rales. She exhibits no tenderness. Right breast exhibits no inverted nipple, no mass, no nipple discharge, no skin change and no tenderness. Left breast exhibits no inverted nipple, no mass, no nipple discharge, no skin change and no tenderness. Breasts are symmetrical.  Abdominal: Soft. Bowel sounds are normal. She exhibits no distension and no mass. There is no tenderness. There is no rebound and no guarding.  Genitourinary:  Pt deferred.  Musculoskeletal: Normal range of  motion. She exhibits no edema or tenderness.  Lymphadenopathy:    She has no cervical adenopathy.  Neurological: She is alert and oriented to person, place, and time.  Skin: Skin is warm and dry. No rash noted. She is not diaphoretic.  Psychiatric: She has a normal mood and affect. Her behavior is normal. Judgment and thought content normal.  Vitals reviewed.    Depression Screen PHQ 2/9 Scores 05/27/2015  PHQ - 2 Score 0      Assessment & Plan:     Routine Health Maintenance and Physical Exam  Exercise Activities and Dietary recommendations Goals    None      Immunization History  Administered Date(s) Administered  . Pneumococcal Polysaccharide-23 11/22/2012    Health Maintenance  Topic Date Due  . Hepatitis C Screening  07-Aug-1956  . FOOT EXAM  11/30/1964  . OPHTHALMOLOGY EXAM  11/30/1964  . URINE MICROALBUMIN  11/30/1964  . HIV Screening  11/30/1969  . TETANUS/TDAP  11/30/1973  . PAP SMEAR  08/28/2017  . MAMMOGRAM  08/28/2016  . COLONOSCOPY  09/13/2017  . ZOSTAVAX  11/30/2014  . INFLUENZA VACCINE  07/15/2015  . HEMOGLOBIN A1C  02/06/2016  . PNEUMOCOCCAL POLYSACCHARIDE VACCINE (2)  11/22/2017      Discussed health benefits of physical activity, and encouraged her to engage in regular exercise appropriate for her age and condition.   1. Annual physical exam Flu vaccine given today. Advised to call [facility] breast clinic for mammogram. She declines pelvic and rectal exam today due to no issues and not needing a Pap smear. I will follow-up with her in 3 months to recheck her hypertension and diabetes. - Flu Vaccine QUAD 36+ mos IM - Mammogram Digital Screening; Future  2. Need for influenza vaccination Flu vaccine given.  3. Depression Diagnoses pulled just for refill of Zoloft as below. She is reported stable and doing well on the 100 mg Zoloft. - sertraline (ZOLOFT) 100 MG tablet; Take 1 tablet (100 mg total) by mouth daily.  Dispense: 30 tablet; Refill: 11  4. Hypercholesterolemia Last lab results indicated an increase in her cholesterol levels throughout. She did state that she has been out of her pravastatin for a few months now and forgot to get it refilled at last office visit. I will refill her pravastatin as below. I directed her to make sure to take this once daily. She is also to start trying her best to follow a low-fat, low-sodium, low-cholesterol diet. We will recheck her cholesterol in 6 months.  - pravastatin (PRAVACHOL) 40 MG tablet; Take 1 tablet (40 mg total) by mouth daily.  Dispense: 30 tablet; Refill: 11  5. Breast cancer screening Order for mammogram placed. Previous mammogram was normal. I advised her to call [facility] clinic for scheduling of her mammogram. - Mammogram Digital Screening; Future  --------------------------------------------------------------------

## 2015-08-29 NOTE — Patient Instructions (Signed)
Health Maintenance Adopting a healthy lifestyle and getting preventive care can go a long way to promote health and wellness. Talk with your health care provider about what schedule of regular examinations is right for you. This is a good chance for you to check in with your provider about disease prevention and staying healthy. In between checkups, there are plenty of things you can do on your own. Experts have done a lot of research about which lifestyle changes and preventive measures are most likely to keep you healthy. Ask your health care provider for more information. WEIGHT AND DIET  Eat a healthy diet 1. Be sure to include plenty of vegetables, fruits, low-fat dairy products, and lean protein. 2. Do not eat a lot of foods high in solid fats, added sugars, or salt. 3. Get regular exercise. This is one of the most important things you can do for your health. 1. Most adults should exercise for at least 150 minutes each week. The exercise should increase your heart rate and make you sweat (moderate-intensity exercise). 2. Most adults should also do strengthening exercises at least twice a week. This is in addition to the moderate-intensity exercise.  Maintain a healthy weight 1. Body mass index (BMI) is a measurement that can be used to identify possible weight problems. It estimates body fat based on height and weight. Your health care provider can help determine your BMI and help you achieve or maintain a healthy weight. 2. For females 6 years of age and older:  1. A BMI below 18.5 is considered underweight. 2. A BMI of 18.5 to 24.9 is normal. 3. A BMI of 25 to 29.9 is considered overweight. 4. A BMI of 30 and above is considered obese.  Watch levels of cholesterol and blood lipids 1. You should start having your blood tested for lipids and cholesterol at 61 years of age, then have this test every 5 years. 2. You may need to have your cholesterol levels checked more often if: 1. Your  lipid or cholesterol levels are high. 2. You are older than 61 years of age. 3. You are at high risk for heart disease.  CANCER SCREENING   Lung Cancer 1. Lung cancer screening is recommended for adults 7-87 years old who are at high risk for lung cancer because of a history of smoking. 2. A yearly low-dose CT scan of the lungs is recommended for people who: 1. Currently smoke. 2. Have quit within the past 15 years. 3. Have at least a 30-pack-year history of smoking. A pack year is smoking an average of one pack of cigarettes a day for 1 year. 3. Yearly screening should continue until it has been 15 years since you quit. 4. Yearly screening should stop if you develop a health problem that would prevent you from having lung cancer treatment.  Breast Cancer  Practice breast self-awareness. This means understanding how your breasts normally appear and feel.  It also means doing regular breast self-exams. Let your health care provider know about any changes, no matter how small.  If you are in your 20s or 30s, you should have a clinical breast exam (CBE) by a health care provider every 1-3 years as part of a regular health exam.  If you are 30 or older, have a CBE every year. Also consider having a breast X-Madlock (mammogram) every year.  If you have a family history of breast cancer, talk to your health care provider about genetic screening.  If you are  at high risk for breast cancer, talk to your health care provider about having an MRI and a mammogram every year.  Breast cancer gene (BRCA) assessment is recommended for women who have family members with BRCA-related cancers. BRCA-related cancers include:  Breast.  Ovarian.  Tubal.  Peritoneal cancers.  Results of the assessment will determine the need for genetic counseling and BRCA1 and BRCA2 testing. Cervical Cancer Routine pelvic examinations to screen for cervical cancer are no longer recommended for nonpregnant women who  are considered low risk for cancer of the pelvic organs (ovaries, uterus, and vagina) and who do not have symptoms. A pelvic examination may be necessary if you have symptoms including those associated with pelvic infections. Ask your health care provider if a screening pelvic exam is right for you.   The Pap test is the screening test for cervical cancer for women who are considered at risk.  If you had a hysterectomy for a problem that was not cancer or a condition that could lead to cancer, then you no longer need Pap tests.  If you are older than 65 years, and you have had normal Pap tests for the past 10 years, you no longer need to have Pap tests.  If you have had past treatment for cervical cancer or a condition that could lead to cancer, you need Pap tests and screening for cancer for at least 20 years after your treatment.  If you no longer get a Pap test, assess your risk factors if they change (such as having a new sexual partner). This can affect whether you should start being screened again.  Some women have medical problems that increase their chance of getting cervical cancer. If this is the case for you, your health care provider may recommend more frequent screening and Pap tests.  The human papillomavirus (HPV) test is another test that may be used for cervical cancer screening. The HPV test looks for the virus that can cause cell changes in the cervix. The cells collected during the Pap test can be tested for HPV.  The HPV test can be used to screen women 2 years of age and older. Getting tested for HPV can extend the interval between normal Pap tests from three to five years.  An HPV test also should be used to screen women of any age who have unclear Pap test results.  After 61 years of age, women should have HPV testing as often as Pap tests.  Colorectal Cancer  This type of cancer can be detected and often prevented.  Routine colorectal cancer screening usually  begins at 61 years of age and continues through 61 years of age.  Your health care provider may recommend screening at an earlier age if you have risk factors for colon cancer.  Your health care provider may also recommend using home test kits to check for hidden blood in the stool.  A small camera at the end of a tube can be used to examine your colon directly (sigmoidoscopy or colonoscopy). This is done to check for the earliest forms of colorectal cancer.  Routine screening usually begins at age 57.  Direct examination of the colon should be repeated every 5-10 years through 61 years of age. However, you may need to be screened more often if early forms of precancerous polyps or small growths are found. Skin Cancer  Check your skin from head to toe regularly.  Tell your health care provider about any new moles or changes in  moles, especially if there is a change in a mole's shape or color.  Also tell your health care provider if you have a mole that is larger than the size of a pencil eraser.  Always use sunscreen. Apply sunscreen liberally and repeatedly throughout the day.  Protect yourself by wearing long sleeves, pants, a wide-brimmed hat, and sunglasses whenever you are outside. HEART DISEASE, DIABETES, AND HIGH BLOOD PRESSURE   Have your blood pressure checked at least every 1-2 years. High blood pressure causes heart disease and increases the risk of stroke.  If you are between 32 years and 30 years old, ask your health care provider if you should take aspirin to prevent strokes.  Have regular diabetes screenings. This involves taking a blood sample to check your fasting blood sugar level.  If you are at a normal weight and have a low risk for diabetes, have this test once every three years after 61 years of age.  If you are overweight and have a high risk for diabetes, consider being tested at a younger age or more often. PREVENTING INFECTION  Hepatitis B  If you have a  higher risk for hepatitis B, you should be screened for this virus. You are considered at high risk for hepatitis B if:  You were born in a country where hepatitis B is common. Ask your health care provider which countries are considered high risk.  Your parents were born in a high-risk country, and you have not been immunized against hepatitis B (hepatitis B vaccine).  You have HIV or AIDS.  You use needles to inject street drugs.  You live with someone who has hepatitis B.  You have had sex with someone who has hepatitis B.  You get hemodialysis treatment.  You take certain medicines for conditions, including cancer, organ transplantation, and autoimmune conditions. Hepatitis C  Blood testing is recommended for:  Everyone born from [birth year] through 1967.  Anyone with known risk factors for hepatitis C. Sexually transmitted infections (STIs)  You should be screened for sexually transmitted infections (STIs) including gonorrhea and chlamydia if:  You are sexually active and are younger than 61 years of age.  You are older than 61 years of age and your health care provider tells you that you are at risk for this type of infection.  Your sexual activity has changed since you were last screened and you are at an increased risk for chlamydia or gonorrhea. Ask your health care provider if you are at risk.  If you do not have HIV, but are at risk, it may be recommended that you take a prescription medicine daily to prevent HIV infection. This is called pre-exposure prophylaxis (PrEP). You are considered at risk if:  You are sexually active and do not regularly use condoms or know the HIV status of your partner(s).  You take drugs by injection.  You are sexually active with a partner who has HIV. Talk with your health care provider about whether you are at high risk of being infected with HIV. If you choose to begin PrEP, you should first be tested for HIV. You should then be tested  every 3 months for as long as you are taking PrEP.  PREGNANCY   If you are premenopausal and you may become pregnant, ask your health care provider about preconception counseling.  If you may become pregnant, take 400 to 800 micrograms (mcg) of folic acid every day.  If you want to prevent pregnancy, talk to your  health care provider about birth control (contraception). OSTEOPOROSIS AND MENOPAUSE   Osteoporosis is a disease in which the bones lose minerals and strength with aging. This can result in serious bone fractures. Your risk for osteoporosis can be identified using a bone density scan.  If you are 34 years of age or older, or if you are at risk for osteoporosis and fractures, ask your health care provider if you should be screened.  Ask your health care provider whether you should take a calcium or vitamin D supplement to lower your risk for osteoporosis.  Menopause may have certain physical symptoms and risks.  Hormone replacement therapy may reduce some of these symptoms and risks. Talk to your health care provider about whether hormone replacement therapy is right for you.  HOME CARE INSTRUCTIONS   Schedule regular health, dental, and eye exams.  Stay current with your immunizations.   Do not use any tobacco products including cigarettes, chewing tobacco, or electronic cigarettes.  If you are pregnant, do not drink alcohol.  If you are breastfeeding, limit how much and how often you drink alcohol.  Limit alcohol intake to no more than 1 drink per day for nonpregnant women. One drink equals 12 ounces of beer, 5 ounces of wine, or 1 ounces of hard liquor.  Do not use street drugs.  Do not share needles.  Ask your health care provider for help if you need support or information about quitting drugs.  Tell your health care provider if you often feel depressed.  Tell your health care provider if you have ever been abused or do not feel safe at home. Document  Released: 06/15/2011 Document Revised: 04/16/2014 Document Reviewed: 11/01/2013 [facility] Patient Information 2015 [place], [place]. This information is not intended to replace advice given to you by your health care provider. Make sure you discuss any questions you have with your health care provider.     Why follow it? Research shows. . Those who follow the Mediterranean diet have a reduced risk of heart disease  . The diet is associated with a reduced incidence of Parkinson's and Alzheimer's diseases . People following the diet may have longer life expectancies and lower rates of chronic diseases  . The Dietary Guidelines for Americans recommends the Mediterranean diet as an eating plan to promote health and prevent disease  What Is the Mediterranean Diet?  . Healthy eating plan based on typical foods and recipes of Mediterranean-style cooking . The diet is primarily a plant based diet; these foods should make up a majority of meals   Starches - Plant based foods should make up a majority of meals - They are an important sources of vitamins, minerals, energy, antioxidants, and fiber - Choose whole grains, foods high in fiber and minimally processed items  - Typical grain sources include wheat, oats, barley, corn, brown rice, bulgar, farro, millet, polenta, couscous  - Various types of beans include chickpeas, lentils, fava beans, black beans, white beans   Fruits  Veggies - Large quantities of antioxidant rich fruits & veggies; 6 or more servings  - Vegetables can be eaten raw or lightly drizzled with oil and cooked  - Vegetables common to the traditional Mediterranean Diet include: artichokes, arugula, beets, broccoli, brussel sprouts, cabbage, carrots, celery, collard greens, cucumbers, eggplant, kale, leeks, lemons, lettuce, mushrooms, okra, onions, peas, peppers, potatoes, pumpkin, radishes, rutabaga, shallots, spinach, sweet potatoes, turnips, zucchini - Fruits common to the Mediterranean  Diet include: apples, apricots, avocados, cherries, clementines, dates, figs, grapefruits,  grapes, melons, nectarines, oranges, peaches, pears, pomegranates, strawberries, tangerines  Fats - Replace butter and margarine with healthy oils, such as olive oil, canola oil, and tahini  - Limit nuts to no more than a handful a day  - Nuts include walnuts, almonds, pecans, pistachios, pine nuts  - Limit or avoid candied, honey roasted or heavily salted nuts - Olives are central to the Mediterranean diet - can be eaten whole or used in a variety of dishes   Meats Protein - Limiting red meat: no more than a few times a month - When eating red meat: choose lean cuts and keep the portion to the size of deck of cards - Eggs: approx. 0 to 4 times a week  - Fish and lean poultry: at least 2 a week  - Healthy protein sources include, chicken, [place], lean beef, lamb - Increase intake of seafood such as tuna, salmon, trout, mackerel, shrimp, scallops - Avoid or limit high fat processed meats such as sausage and bacon  Dairy - Include moderate amounts of low fat dairy products  - Focus on healthy dairy such as fat free yogurt, skim milk, low or reduced fat cheese - Limit dairy products higher in fat such as whole or 2% milk, cheese, ice cream  Alcohol - Moderate amounts of red wine is ok  - No more than 5 oz daily for women (all ages) and men older than age 74  - No more than 10 oz of wine daily for men younger than 44  Other - Limit sweets and other desserts  - Use herbs and spices instead of salt to flavor foods  - Herbs and spices common to the traditional Mediterranean Diet include: basil, bay leaves, chives, cloves, cumin, fennel, garlic, lavender, marjoram, mint, oregano, parsley, pepper, rosemary, sage, savory, sumac, tarragon, thyme   It's not just a diet, it's a lifestyle:  . The Mediterranean diet includes lifestyle factors typical of those in the region  . Foods, drinks and meals are best eaten  with others and savored . Daily physical activity is important for overall good health . This could be strenuous exercise like running and aerobics . This could also be more leisurely activities such as walking, housework, yard-work, or taking the stairs . Moderation is the key; a balanced and healthy diet accommodates most foods and drinks . Consider portion sizes and frequency of consumption of certain foods   Meal Ideas & Options:  . Breakfast:  o Whole wheat toast or whole wheat English muffins with peanut butter & hard boiled egg o Steel cut oats topped with apples & cinnamon and skim milk  o Fresh fruit: banana, strawberries, melon, berries, peaches  o Smoothies: strawberries, bananas, greek yogurt, peanut butter o Low fat greek yogurt with blueberries and granola  o Egg white omelet with spinach and mushrooms o Breakfast couscous: whole wheat couscous, apricots, skim milk, cranberries  . Sandwiches:  o Hummus and grilled vegetables (peppers, zucchini, squash) on whole wheat bread   o Grilled chicken on whole wheat pita with lettuce, tomatoes, cucumbers or tzatziki  o Tuna salad on whole wheat bread: tuna salad made with greek yogurt, olives, red peppers, capers, green onions o Garlic rosemary lamb pita: lamb sauted with garlic, rosemary, salt & pepper; add lettuce, cucumber, greek yogurt to pita - flavor with lemon juice and black pepper  . Seafood:  o Mediterranean grilled salmon, seasoned with garlic, basil, parsley, lemon juice and black pepper o Shrimp, lemon, and spinach  whole-grain pasta salad made with low fat greek yogurt  o Seared scallops with lemon orzo  o Seared tuna steaks seasoned salt, pepper, coriander topped with tomato mixture of olives, tomatoes, olive oil, minced garlic, parsley, green onions and cappers  . Meats:  o Herbed greek chicken salad with kalamata olives, cucumber, feta  o Red bell peppers stuffed with spinach, bulgur, lean ground beef (or lentils) &  topped with feta   o Kebabs: skewers of chicken, tomatoes, onions, zucchini, squash  o [place] burgers: made with red onions, mint, dill, lemon juice, feta cheese topped with roasted red peppers . Vegetarian o Cucumber salad: cucumbers, artichoke hearts, celery, red onion, feta cheese, tossed in olive oil & lemon juice  o Hummus and whole grain pita points with a greek salad (lettuce, tomato, feta, olives, cucumbers, red onion) o Lentil soup with celery, carrots made with vegetable broth, garlic, salt and pepper  o Tabouli salad: parsley, bulgur, mint, scallions, cucumbers, tomato, radishes, lemon juice, olive oil, salt and pepper.      American Heart Association (AHA) Exercise Recommendation  Being physically active is important to prevent heart disease and stroke, the nation's No. 1and No. 5killers. To improve overall cardiovascular health, we suggest at least 150 minutes per week of moderate exercise or 75 minutes per week of vigorous exercise (or a combination of moderate and vigorous activity). Thirty minutes a day, five times a week is an easy goal to remember. You will also experience benefits even if you divide your time into two or three segments of 10 to 15 minutes per day.  For people who would benefit from lowering their blood pressure or cholesterol, we recommend 40 minutes of aerobic exercise of moderate to vigorous intensity three to four times a week to lower the risk for heart attack and stroke.  Physical activity is anything that makes you move your body and burn calories.  This includes things like climbing stairs or playing sports. Aerobic exercises benefit your heart, and include walking, jogging, swimming or biking. Strength and stretching exercises are best for overall stamina and flexibility.  The simplest, positive change you can make to effectively improve your heart health is to start walking. It's enjoyable, free, easy, social and great exercise. A walking program is  flexible and boasts high success rates because people can stick with it. It's easy for walking to become a regular and satisfying part of life.   For Overall Cardiovascular Health:  At least 30 minutes of moderate-intensity aerobic activity at least 5 days per week for a total of 150  OR   At least 25 minutes of vigorous aerobic activity at least 3 days per week for a total of 75 minutes; or a combination of moderate- and vigorous-intensity aerobic activity  AND   Moderate- to high-intensity muscle-strengthening activity at least 2 days per week for additional health benefits.  For Lowering Blood Pressure and Cholesterol  An average 40 minutes of moderate- to vigorous-intensity aerobic activity 3 or 4 times per week  What if I can't make it to the time goal? Something is always better than nothing! And everyone has to start somewhere. Even if you've been sedentary for years, today is the day you can begin to make healthy changes in your life. If you don't think you'll make it for 30 or 40 minutes, set a reachable goal for today. You can work up toward your overall goal by increasing your time as you get stronger. Don't let all-or-nothing thinking  rob you of doing what you can every day.  Source:http://www.heart.org

## 2015-09-02 ENCOUNTER — Encounter (INDEPENDENT_AMBULATORY_CARE_PROVIDER_SITE_OTHER): Payer: Self-pay

## 2015-09-02 ENCOUNTER — Ambulatory Visit
Admission: RE | Admit: 2015-09-02 | Discharge: 2015-09-02 | Disposition: A | Discharge status: Home / Self Care | Payer: 59 | Source: Ambulatory Visit | Attending: Physician Assistant | Admitting: Physician Assistant

## 2015-09-02 DIAGNOSIS — Z0000 Encounter for general adult medical examination without abnormal findings: Secondary | ICD-10-CM

## 2015-09-02 DIAGNOSIS — Z1231 Encounter for screening mammogram for malignant neoplasm of breast: Secondary | ICD-10-CM | POA: Diagnosis not present

## 2015-09-02 DIAGNOSIS — Z1239 Encounter for other screening for malignant neoplasm of breast: Secondary | ICD-10-CM

## 2015-09-03 ENCOUNTER — Telehealth: Payer: Self-pay

## 2015-09-03 NOTE — Telephone Encounter (Signed)
-----   Message from Mar Daring, PA-C sent at 09/03/2015  8:57 AM EDT ----- Normal mammogram.

## 2015-09-03 NOTE — Telephone Encounter (Signed)
Patient advised as directed below.  Thanks,  -Heriberto Stmartin 

## 2015-09-09 ENCOUNTER — Encounter: Payer: Self-pay | Admitting: Physician Assistant

## 2015-09-09 ENCOUNTER — Ambulatory Visit (INDEPENDENT_AMBULATORY_CARE_PROVIDER_SITE_OTHER): Payer: 59 | Admitting: Physician Assistant

## 2015-09-09 VITALS — BP 160/82 | HR 82 | Temp 98.8°F | Resp 19 | Wt 211.8 lb

## 2015-09-09 DIAGNOSIS — J0100 Acute maxillary sinusitis, unspecified: Secondary | ICD-10-CM | POA: Diagnosis not present

## 2015-09-09 DIAGNOSIS — R05 Cough: Secondary | ICD-10-CM | POA: Diagnosis not present

## 2015-09-09 DIAGNOSIS — R059 Cough, unspecified: Secondary | ICD-10-CM

## 2015-09-09 MED ORDER — DOXYCYCLINE HYCLATE 100 MG PO TABS: 100.0000 mg | ORAL_TABLET | Freq: Two times a day (BID) | ORAL | Status: DC

## 2015-09-09 MED ORDER — HYDROCODONE-HOMATROPINE 5-1.5 MG/5ML PO SYRP: 5.0000 mL | ORAL_SOLUTION | Freq: Three times a day (TID) | ORAL | Status: DC | PRN

## 2015-09-09 NOTE — Patient Instructions (Signed)
Sinusitis Sinusitis is redness, soreness, and inflammation of the paranasal sinuses. Paranasal sinuses are air pockets within the bones of your face (beneath the eyes, the middle of the forehead, or above the eyes). In healthy paranasal sinuses, mucus is able to drain out, and air is able to circulate through them by way of your nose. However, when your paranasal sinuses are inflamed, mucus and air can become trapped. This can allow bacteria and other germs to grow and cause infection. Sinusitis can develop quickly and last only a short time (acute) or continue over a long period (chronic). Sinusitis that lasts for more than 12 weeks is considered chronic.  CAUSES  Causes of sinusitis include:  Allergies.  Structural abnormalities, such as displacement of the cartilage that separates your nostrils (deviated septum), which can decrease the air flow through your nose and sinuses and affect sinus drainage.  Functional abnormalities, such as when the small hairs (cilia) that line your sinuses and help remove mucus do not work properly or are not present. SIGNS AND SYMPTOMS  Symptoms of acute and chronic sinusitis are the same. The primary symptoms are pain and pressure around the affected sinuses. Other symptoms include:  Upper toothache.  Earache.  Headache.  Bad breath.  Decreased sense of smell and taste.  A cough, which worsens when you are lying flat.  Fatigue.  Fever.  Thick drainage from your nose, which often is green and may contain pus (purulent).  Swelling and warmth over the affected sinuses. DIAGNOSIS  Your health care provider will perform a physical exam. During the exam, your health care provider may:  Look in your nose for signs of abnormal growths in your nostrils (nasal polyps).  Tap over the affected sinus to check for signs of infection.  View the inside of your sinuses (endoscopy) using an imaging device that has a light attached (endoscope). If your health  care provider suspects that you have chronic sinusitis, one or more of the following tests may be recommended:  Allergy tests.  Nasal culture. A sample of mucus is taken from your nose, sent to a lab, and screened for bacteria.  Nasal cytology. A sample of mucus is taken from your nose and examined by your health care provider to determine if your sinusitis is related to an allergy. TREATMENT  Most cases of acute sinusitis are related to a viral infection and will resolve on their own within 10 days. Sometimes medicines are prescribed to help relieve symptoms (pain medicine, decongestants, nasal steroid sprays, or saline sprays).  However, for sinusitis related to a bacterial infection, your health care provider will prescribe antibiotic medicines. These are medicines that will help kill the bacteria causing the infection.  Rarely, sinusitis is caused by a fungal infection. In theses cases, your health care provider will prescribe antifungal medicine. For some cases of chronic sinusitis, surgery is needed. Generally, these are cases in which sinusitis recurs more than 3 times per year, despite other treatments. HOME CARE INSTRUCTIONS   Drink plenty of water. Water helps thin the mucus so your sinuses can drain more easily.  Use a humidifier.  Inhale steam 3 to 4 times a day (for example, sit in the bathroom with the shower running).  Apply a warm, moist washcloth to your face 3 to 4 times a day, or as directed by your health care provider.  Use saline nasal sprays to help moisten and clean your sinuses.  Take medicines only as directed by your health care provider.    If you were prescribed either an antibiotic or antifungal medicine, finish it all even if you start to feel better. SEEK IMMEDIATE MEDICAL CARE IF:  You have increasing pain or severe headaches.  You have nausea, vomiting, or drowsiness.  You have swelling around your face.  You have vision problems.  You have a stiff  neck.  You have difficulty breathing. MAKE SURE YOU:   Understand these instructions.  Will watch your condition.  Will get help right away if you are not doing well or get worse. Document Released: 11/30/2005 Document Revised: 04/16/2014 Document Reviewed: 12/15/2011 [facility] Patient Information 2015 [place], [place]. This information is not intended to replace advice given to you by your health care provider. Make sure you discuss any questions you have with your health care provider.  Homatropine; Hydrocodone oral syrup What is this medicine? HYDROCODONE (hye droe KOE done) is used to help relieve cough. This medicine may be used for other purposes; ask your health care provider or pharmacist if you have questions. COMMON BRAND NAME(S): Hycodan, Hydromet, Hydropane, Mycodone What should I tell my health care provider before I take this medicine? They need to know if you have any of these conditions: -brain tumor -drug abuse or addiction -head injury -heart disease -if you frequently drink alcohol-containing drinks -kidney disease -liver disease -lung disease, asthma, or breathing problems -mental problems -an allergic reaction to hydrocodone, other opioid analgesics, other medicines, foods, dyes, or preservatives -pregnant or trying to get pregnant -breast-feeding How should I use this medicine? Take this medicine by mouth with a full glass of water. Use a specially marked spoon or dropper to measure your dose. Ask your pharmacist if you do not have one. Do not use a household spoon. Follow the directions on the prescription label. Take your medicine at regular intervals. Do not take your medicine more often than directed. Talk to your pediatrician regarding the use of this medicine in children. This medicine is not approved for use in children less than 60 years old. Patients over 47 years old may have a stronger reaction and need a smaller dose. Overdosage: If you think you  have taken too much of this medicine contact a poison control center or emergency room at once. NOTE: This medicine is only for you. Do not share this medicine with others. What if I miss a dose? If you miss a dose, take it as soon as you can. If it is almost time for your next dose, take only that dose. Do not take double or extra doses. What may interact with this medicine? -alcohol -antihistamines -MAOIs like Carbex, Eldepryl, Marplan, Nardil, and Parnate -medicines for depression, anxiety, or psychotic disturbances -medicines for sleep -muscle relaxants -naltrexone -narcotic medicines (opiates) for pain -tramadol -tricyclic antidepressants like amitriptyline, clomipramine, desipramine, doxepin, imipramine, nortriptyline, and protriptyline This list may not describe all possible interactions. Give your health care provider a list of all the medicines, herbs, non-prescription drugs, or dietary supplements you use. Also tell them if you smoke, drink alcohol, or use illegal drugs. Some items may interact with your medicine. What should I watch for while using this medicine? You may develop tolerance to this medicine if you take it for a long time. Tolerance means that you will get less cough relief with time. Tell your doctor or health care professional if you symptoms do not improve or if they get worse. Do not suddenly stop taking your medicine because you may develop a severe reaction. Your body becomes used to  the medicine. This does NOT mean you are addicted. Addiction is a behavior related to getting and using a drug for a non-medical reason. If your doctor wants you to stop the medicine, the dose will be slowly lowered over time to avoid any side effects. You may get drowsy or dizzy. Do not drive, use machinery, or do anything that needs mental alertness until you know how this medicine affects you. Do not stand or sit up quickly, especially if you are an older patient. This reduces the  risk of dizzy or fainting spells. Alcohol may interfere with the effect of this medicine. Avoid alcoholic drinks. This medicine will cause constipation. Try to have a bowel movement at least every 2 to 3 days. If you do not have a bowel movement for 3 days, call your doctor or health care professional. What side effects may I notice from receiving this medicine? Side effects that you should report to your doctor or health care professional as soon as possible: -allergic reactions like skin rash, itching or hives, swelling of the face, lips, or tongue -breathing difficulties, wheezing -confusion -light headedness or fainting spells Side effects that usually do not require medical attention (report to your doctor or health care professional if they continue or are bothersome): -dizziness -drowsiness -itching -nausea -vomiting This list may not describe all possible side effects. Call your doctor for medical advice about side effects. You may report side effects to FDA at 1-800-FDA-1088. Where should I keep my medicine? Keep out of the reach of children. This medicine can be abused. Keep your medicine in a safe place to protect it from theft. Do not share this medicine with anyone. Selling or giving away this medicine is dangerous and against the law. Store at room temperature between 15 and 30 degrees C (59 and 86 degrees F). Protect from light. Throw away any unused medicine after the expiration date. Discard unused medicine and used packaging carefully. Pets and children can be harmed if they find used or lost packages. NOTE: This sheet is a summary. It may not cover all possible information. If you have questions about this medicine, talk to your doctor, pharmacist, or health care provider.  2015, Elsevier/[employer] Standard. (2011-08-10 10:04:07)

## 2015-09-09 NOTE — Progress Notes (Signed)
Patient: [name] Female    DOB: 19-Nov-1956   59 y.o.   MRN: [id number] Visit Date: 09/09/2015  Today's Provider: Mar Daring, PA-C   Chief Complaint  Patient presents with  . Sinusitis  . Sore Throat   Subjective:    Sinusitis This is a new problem. The current episode started in the past 7 days. The problem has been gradually worsening since onset. There has been no fever. She is experiencing no pain. Associated symptoms include congestion, coughing, ear pain, headaches, shortness of breath, sinus pressure, sneezing and a sore throat. Treatments tried: Drinking plenty of fluids.  Sore Throat  This is a new problem. The current episode started in the past 7 days. The problem has been gradually worsening. Neither (Just sore) side of throat is experiencing more pain than the other. There has been no fever. The pain is at a severity of 5/10 (Only when swallowing or coughing). Associated symptoms include congestion, coughing, ear pain, headaches, shortness of breath and vomiting. Associated symptoms comments: Vomit Friday night. She has had no exposure to strep. She has tried nothing for the symptoms.       Allergies  Allergen Reactions  . Penicillins   . Tetanus-Diphtheria Toxoids Td    Previous Medications   ALPRAZOLAM (XANAX) 0.5 MG TABLET    Take 1 tablet (0.5 mg total) by mouth every 8 (eight) hours. 1/2 to 1 tab before work and q8h as needed   ASPIRIN 81 MG TABLET    Take 81 mg by mouth daily.   CALCIUM CARBONATE-VIT D-MIN PO    Take 1 tablet by mouth daily.   CHOLECALCIFEROL (VITAMIN D) 2000 UNITS CAPS    Take 4 capsules by mouth daily.   HYDROCHLOROTHIAZIDE (HYDRODIURIL) 25 MG TABLET    Take 1 tablet (25 mg total) by mouth daily.   HYDROXYZINE (ATARAX/VISTARIL) 25 MG TABLET    Take 1 tablet by mouth 3 (three) times daily as needed.   LORATADINE (CLARITIN) 10 MG TABLET    Take 1 tablet by mouth daily.   LOSARTAN (COZAAR) 25 MG TABLET    Take 1 tablet (25  mg total) by mouth 2 (two) times daily.   METFORMIN (GLUCOPHAGE) 1000 MG TABLET    Take 1 tablet (1,000 mg total) by mouth 2 (two) times daily with a meal.   MONTELUKAST (SINGULAIR) 10 MG TABLET    Take 10 mg by mouth daily.   NAPROXEN SODIUM (ANAPROX) 220 MG TABLET    Take 220 mg by mouth as needed.   PRAVASTATIN (PRAVACHOL) 40 MG TABLET    Take 1 tablet (40 mg total) by mouth daily.   PSYLLIUM (REGULOID) 0.52 G CAPSULE    Take 1 capsule by mouth as directed.   SERTRALINE (ZOLOFT) 100 MG TABLET    Take 1 tablet (100 mg total) by mouth daily.   TOLTERODINE (DETROL LA) 2 MG 24 HR CAPSULE    Take 1 capsule by mouth daily.    Review of Systems  Constitutional: Negative.   HENT: Positive for congestion, ear pain, postnasal drip, rhinorrhea, sinus pressure, sneezing and sore throat.   Eyes: Negative.        Watery eye  Respiratory: Positive for cough, chest tightness, shortness of breath and wheezing (feels like not enough air).   Cardiovascular: Negative.   Gastrointestinal: Positive for vomiting.  Endocrine: Negative.   Genitourinary: Negative.   Musculoskeletal: Negative.   Skin: Negative.   Allergic/Immunologic: Negative.  Neurological: Positive for headaches.  Hematological: Negative.   Psychiatric/Behavioral: Negative.     Social History  Substance Use Topics  . Smoking status: Former Smoker -- 1.50 packs/day for 25 years    Types: Cigarettes  . Smokeless tobacco: Never Used  . Alcohol Use: 0.0 oz/week    0 Standard drinks or equivalent per week     Comment: 1- every 3-4 months   Objective:   BP 160/82 mmHg  Pulse 82  Temp(Src) 98.8 F (37.1 C) (Oral)  Resp 19  Wt 211 lb 12.8 oz (96.072 kg)  SpO2 97%  LMP  (Approximate)  Physical Exam  Constitutional: She appears well-developed and well-nourished. No distress.  HENT:  Head: Normocephalic and atraumatic.  Right Ear: Hearing, external ear and ear canal normal. Tympanic membrane is not erythematous, not retracted and  not bulging. A middle ear effusion is present.  Left Ear: Hearing, external ear and ear canal normal. Tympanic membrane is not erythematous, not retracted and not bulging. A middle ear effusion is present.  Nose: Mucosal edema and rhinorrhea present. Right sinus exhibits maxillary sinus tenderness. Right sinus exhibits no frontal sinus tenderness. Left sinus exhibits maxillary sinus tenderness. Left sinus exhibits no frontal sinus tenderness.  Mouth/Throat: Uvula is midline, oropharynx is clear and moist and mucous membranes are normal. No oropharyngeal exudate.  Eyes: Conjunctivae and EOM are normal. Pupils are equal, round, and reactive to light. Right eye exhibits no discharge. Left eye exhibits no discharge.  Neck: Normal range of motion. Neck supple. No JVD present. No tracheal deviation present. No Brudzinski's sign and no Kernig's sign noted. No thyromegaly present.  Cardiovascular: Normal rate, regular rhythm and normal heart sounds.  Exam reveals no gallop and no friction rub.   No murmur heard. Pulmonary/Chest: Effort normal and breath sounds normal. No stridor. No respiratory distress. She has no wheezes. She has no rales. She exhibits no tenderness.  Lymphadenopathy:    She has no cervical adenopathy.  Skin: Skin is warm and dry.        Assessment & Plan:     1. Acute maxillary sinusitis, recurrence not specified Worsening.  Will treat with antibiotic as below as she quickly decompensates to bronchitis due to smoking history.  She is to call if symptoms fail to improve or worsen. - doxycycline (VIBRA-TABS) 100 MG tablet; Take 1 tablet (100 mg total) by mouth 2 (two) times daily.  Dispense: 20 tablet; Refill: 0  2. Cough Cough syrup given as below for nighttime cough.  Drowsiness precautions advised and told not to drive after she takes it.  She does deliver newspapers as a second job, so I wanted to make sure she did not drive for 6-8 hours after taking the medication.  She is to  call if the cough worsens or if she develops wheezing, chest tightness or chest pain. - HYDROcodone-homatropine (HYCODAN) 5-1.5 MG/5ML syrup; Take 5 mLs by mouth every 8 (eight) hours as needed for cough.  Dispense: 120 mL; Refill: 0       Mar Daring, PA-C  [facility]

## 2015-09-11 ENCOUNTER — Other Ambulatory Visit: Payer: Self-pay

## 2015-09-11 VITALS — BP 130/72 | Ht 67.0 in | Wt 208.8 lb

## 2015-09-11 DIAGNOSIS — E1165 Type 2 diabetes mellitus with hyperglycemia: Secondary | ICD-10-CM

## 2015-09-11 DIAGNOSIS — IMO0002 Reserved for concepts with insufficient information to code with codable children: Secondary | ICD-10-CM

## 2015-09-11 NOTE — Patient Outreach (Addendum)
[facility]) Care Management  [place]  09/11/2015   [name] 1956/10/25 [id number]  Subjective: Patient in for her regularly scheduled [facility] visits.  A1C 8.6% at full physical completed on 9/15- MD did not order additional medications.  Patient believes blood sugars are high because of the amount of milk that she drinks but she's only drinking 2-3 glasses per day.  Checking blood sugars about every other day (did not bring her meter) and states that her blood sugars are 120mg /dl in the am but >200mg /dl after eating.   Patient denies dizziness, chest pain or shortness of breath.  She has completed her mammogram and her full physical and purchased dental floss for her teeth.  She expresses some stress related to her daughter's finances and her boyfriend. [name] has had sinus problems for the last few days and is on antibiotics. Reports that she got the flu shot at her MD visit.   Objective:  Filed Vitals:   09/11/15 1147  BP: 130/72  Height: 1.702 m (5\' 7" )  Weight: 208 lb 12.8 oz (94.711 kg)     Current Medications:  Current Outpatient Prescriptions  Medication Sig Dispense Refill  . ALPRAZolam (XANAX) 0.5 MG tablet Take 1 tablet (0.5 mg total) by mouth every 8 (eight) hours. 1/2 to 1 tab before work and q8h as needed 30 tablet 1  . aspirin 81 MG tablet Take 81 mg by mouth daily.    Marland Kitchen doxycycline (VIBRA-TABS) 100 MG tablet Take 1 tablet (100 mg total) by mouth 2 (two) times daily. 20 tablet 0  . hydrochlorothiazide (HYDRODIURIL) 25 MG tablet Take 1 tablet (25 mg total) by mouth daily. 30 tablet 6  . HYDROcodone-homatropine (HYCODAN) 5-1.5 MG/5ML syrup Take 5 mLs by mouth every 8 (eight) hours as needed for cough. 120 mL 0  . losartan (COZAAR) 25 MG tablet Take 1 tablet (25 mg total) by mouth 2 (two) times daily. 60 tablet 6  . metFORMIN (GLUCOPHAGE) 1000 MG tablet Take 1 tablet (1,000 mg total) by mouth 2 (two) times daily with a meal. 60  tablet 6  . naproxen sodium (ANAPROX) 220 MG tablet Take 220 mg by mouth as needed.    . pravastatin (PRAVACHOL) 40 MG tablet Take 1 tablet (40 mg total) by mouth daily. 30 tablet 11  . sertraline (ZOLOFT) 100 MG tablet Take 1 tablet (100 mg total) by mouth daily. 30 tablet 11  . CALCIUM CARBONATE-VIT D-MIN PO Take 1 tablet by mouth daily.    . Cholecalciferol (VITAMIN D) 2000 UNITS CAPS Take 4 capsules by mouth daily.    . hydrOXYzine (ATARAX/VISTARIL) 25 MG tablet Take 1 tablet by mouth 3 (three) times daily as needed.    . loratadine (CLARITIN) 10 MG tablet Take 1 tablet by mouth daily.    . montelukast (SINGULAIR) 10 MG tablet Take 10 mg by mouth daily.    . psyllium (REGULOID) 0.52 G capsule Take 1 capsule by mouth as directed.    . tolterodine (DETROL LA) 2 MG 24 hr capsule Take 1 capsule by mouth daily.     No current facility-administered medications for this visit.    Functional Status:  In your present state of health, do you have any difficulty performing the following activities: 09/11/2015 05/27/2015  Hearing? N N  Vision? N N  Difficulty concentrating or making decisions? N N  Walking or climbing stairs? N N  Dressing or bathing? N N  Doing errands, shopping? N N  Fall/Depression Screening: PHQ 2/9 Scores 09/11/2015 05/27/2015  PHQ - 2 Score 2 0  PHQ- 9 Score 8 -    Assessment: Poorly controlled diabetes.  She is not checking her blood sugars often and although she has a job that requires her to walk, she is not getting enough physical activity. She has little time to fit it in since she works 2 jobs.   Plan: I have asked [name] to write her blood sugars down so we can evaluate when she is having elevated sugars.  She reports wanting to get her dental and dilated eye before the end of the year.  Limit milk and drink it with a meal to lessen the glycemic rise.  [facility] CM Care Plan Problem One        Most Recent Value   Care Plan Problem One  needs preventative care-  dental, eye eams- will complete by the end of the year   Role Documenting the Problem One  Care Management [place] for Problem One  Active [1. schedule dilated eye exam  2. schedule cataract surgery  3. schedule dental cleaning]   [facility] Long Term Goal (31-90 days)  Schedule your dilated ey    The goal is to complete her preventative tests by the end of 2016.   Patient likely needs more diabetes medications with this A1C of 8.6%- numerous medications are available onTeresa's healthcare plan for 0 cost- consider Invokana or Wilder Glade, both have a coupon which will decrease the cost to 0, it will not cause weight gain and will not likely result in hypoglycemia.   Follow up in one month.    Gentry Fitz, RN, BA, [place], [place] Direct Dial:  [phone]  Fax:  [phone] E-mail: [email or web address] [address], [place], [place]  [address]

## 2015-09-30 ENCOUNTER — Encounter: Payer: Self-pay | Admitting: Physician Assistant

## 2015-09-30 ENCOUNTER — Other Ambulatory Visit: Payer: Self-pay | Admitting: Physician Assistant

## 2015-09-30 ENCOUNTER — Ambulatory Visit: Payer: Self-pay | Admitting: Physician Assistant

## 2015-09-30 VITALS — BP 161/89 | Temp 98.9°F | Wt 215.0 lb

## 2015-09-30 DIAGNOSIS — J209 Acute bronchitis, unspecified: Secondary | ICD-10-CM

## 2015-09-30 MED ORDER — PSEUDOEPH-BROMPHEN-DM 30-2-10 MG/5ML PO SYRP: 5.0000 mL | ORAL_SOLUTION | Freq: Four times a day (QID) | ORAL | Status: DC | PRN

## 2015-09-30 MED ORDER — IBUPROFEN 800 MG PO TABS: 800.0000 mg | ORAL_TABLET | Freq: Three times a day (TID) | ORAL | Status: DC | PRN

## 2015-09-30 MED ORDER — SULFAMETHOXAZOLE-TRIMETHOPRIM 800-160 MG PO TABS: 1.0000 | ORAL_TABLET | Freq: Two times a day (BID) | ORAL | Status: DC

## 2015-09-30 NOTE — Progress Notes (Signed)
  Cough Subjective:    Patient ID: [name], female    DOB: 1956-11-25, 60 y.o.   MRN: [id number]  HPI Patient c/o one week of thick productive cough. States sinus congestion increasing at night.Denies fever, or N/V/D. No relief with OTC medications.    Review of Systems Hypertension, Diabetes, and anxiety.     Objective:   Physical Exam HEENT for maxillary guarding, post nasal drainage, productive greenish sputum.  Neck supple, Lung with Rales. Heart RRR.       Assessment & Plan:  URI. Prescription for Bomfed DM, Motrin, and Bactrim  DS.  Follow up PRN.

## 2015-10-28 ENCOUNTER — Encounter: Payer: Self-pay | Admitting: Family Medicine

## 2015-10-28 ENCOUNTER — Ambulatory Visit
Admission: RE | Admit: 2015-10-28 | Discharge: 2015-10-28 | Disposition: A | Discharge status: Home / Self Care | Payer: 59 | Source: Ambulatory Visit | Attending: Family Medicine | Admitting: Family Medicine

## 2015-10-28 ENCOUNTER — Other Ambulatory Visit
Admission: RE | Admit: 2015-10-28 | Discharge: 2015-10-28 | Disposition: A | Discharge status: Home / Self Care | Payer: 59 | Source: Ambulatory Visit | Attending: Family Medicine | Admitting: Family Medicine

## 2015-10-28 ENCOUNTER — Ambulatory Visit (INDEPENDENT_AMBULATORY_CARE_PROVIDER_SITE_OTHER): Payer: 59 | Admitting: Family Medicine

## 2015-10-28 VITALS — BP 160/90 | HR 87 | Temp 98.2°F | Resp 18 | Wt 212.8 lb

## 2015-10-28 DIAGNOSIS — R05 Cough: Secondary | ICD-10-CM | POA: Insufficient documentation

## 2015-10-28 DIAGNOSIS — J309 Allergic rhinitis, unspecified: Secondary | ICD-10-CM | POA: Diagnosis not present

## 2015-10-28 DIAGNOSIS — R059 Cough, unspecified: Secondary | ICD-10-CM

## 2015-10-28 LAB — CBC WITH DIFFERENTIAL/PLATELET
Basophils Absolute: 0 10*3/uL (ref 0–0.1)
Basophils Relative: 1 %
Eosinophils Absolute: 0.4 10*3/uL (ref 0–0.7)
Eosinophils Relative: 5 %
HCT: 40.2 % (ref 35.0–47.0)
Hemoglobin: 13.4 g/dL (ref 12.0–16.0)
Lymphocytes Relative: 22 %
Lymphs Abs: 2 10*3/uL (ref 1.0–3.6)
MCH: 28.2 pg (ref 26.0–34.0)
MCHC: 33.4 g/dL (ref 32.0–36.0)
MCV: 84.3 fL (ref 80.0–100.0)
Monocytes Absolute: 0.4 10*3/uL (ref 0.2–0.9)
Monocytes Relative: 5 %
Neutro Abs: 6.2 10*3/uL (ref 1.4–6.5)
Neutrophils Relative %: 67 %
Platelets: 253 10*3/uL (ref 150–440)
RBC: 4.77 MIL/uL (ref 3.80–5.20)
RDW: 13.3 % (ref 11.5–14.5)
WBC: 9.1 10*3/uL (ref 3.6–11.0)

## 2015-10-28 MED ORDER — MONTELUKAST SODIUM 10 MG PO TABS: 10.0000 mg | ORAL_TABLET | Freq: Every day | ORAL | Status: DC

## 2015-10-28 MED ORDER — BENZONATATE 200 MG PO CAPS: 200.0000 mg | ORAL_CAPSULE | Freq: Three times a day (TID) | ORAL | Status: DC | PRN

## 2015-10-28 MED ORDER — LORATADINE 10 MG PO TABS: 10.0000 mg | ORAL_TABLET | Freq: Every day | ORAL | Status: DC

## 2015-10-28 NOTE — Progress Notes (Signed)
Patient ID: [name], female   DOB: 1956-11-08, 59 y.o.   MRN: [id number] Name: [name]   MRN: [id number]    DOB: 10-Dec-1956   Date:10/28/2015       Progress Note  Subjective  Chief Complaint  Chief Complaint  Patient presents with  . Cough  . Nasal Congestion   Cough This is a recurrent problem. The current episode started more than 1 month ago (Recent episode started over the past week.). The problem has been waxing and waning (treated with Doxycycline on 09-09-15 for bronchitis andBromfed-DM with Bactrim on 09-30-15 in the ER.). The cough is productive of sputum. Associated symptoms include nasal congestion, postnasal drip and rhinorrhea. Pertinent negatives include no chest pain, chills, fever, shortness of breath or wheezing. She has tried prescription cough suppressant (and antibiotics) for the symptoms. The treatment provided mild relief.   Past Surgical History  Procedure Laterality Date  . Breast lumpectomy Right 2005    as reported by patient  . Cataract extraction  January 2013  . Breast excisional biopsy Right 1999   Past Medical History  Diagnosis Date  . Hyperlipidemia   . Hypertension   . Depression   . Joint pain     as reported by patient  . Urinary incontinence     as stated by patient  . Cataract     left  . Anxiety    Social History  Substance Use Topics  . Smoking status: Former Smoker -- 1.50 packs/day for 25 years    Types: Cigarettes  . Smokeless tobacco: Never Used  . Alcohol Use: 0.0 oz/week    0 Standard drinks or equivalent per week     Comment: 1- every 3-4 months    Current outpatient prescriptions:  .  ALPRAZolam (XANAX) 0.5 MG tablet, Take 1 tablet (0.5 mg total) by mouth every 8 (eight) hours. 1/2 to 1 tab before work and q8h as needed, Disp: 30 tablet, Rfl: 1 .  aspirin 81 MG tablet, Take 81 mg by mouth daily., Disp: , Rfl:  .  CALCIUM CARBONATE-VIT D-MIN PO, Take 1 tablet by mouth daily., Disp: , Rfl:  .   Cholecalciferol (VITAMIN D) 2000 UNITS CAPS, Take 4 capsules by mouth daily., Disp: , Rfl:  .  hydrochlorothiazide (HYDRODIURIL) 25 MG tablet, Take 1 tablet (25 mg total) by mouth daily., Disp: 30 tablet, Rfl: 6 .  hydrOXYzine (ATARAX/VISTARIL) 25 MG tablet, Take 1 tablet by mouth 3 (three) times daily as needed., Disp: , Rfl:  .  ibuprofen (ADVIL,MOTRIN) 800 MG tablet, Take 1 tablet (800 mg total) by mouth every 8 (eight) hours as needed for moderate pain., Disp: 15 tablet, Rfl: 0 .  loratadine (CLARITIN) 10 MG tablet, Take 1 tablet by mouth daily., Disp: , Rfl:  .  losartan (COZAAR) 25 MG tablet, Take 1 tablet (25 mg total) by mouth 2 (two) times daily., Disp: 60 tablet, Rfl: 6 .  metFORMIN (GLUCOPHAGE) 1000 MG tablet, Take 1 tablet (1,000 mg total) by mouth 2 (two) times daily with a meal., Disp: 60 tablet, Rfl: 6 .  montelukast (SINGULAIR) 10 MG tablet, Take 10 mg by mouth daily., Disp: , Rfl:  .  naproxen sodium (ANAPROX) 220 MG tablet, Take 220 mg by mouth as needed., Disp: , Rfl:  .  pravastatin (PRAVACHOL) 40 MG tablet, Take 1 tablet (40 mg total) by mouth daily., Disp: 30 tablet, Rfl: 11 .  psyllium (REGULOID) 0.52 G capsule, Take 1 capsule by mouth as directed.,  Disp: , Rfl:  .  sertraline (ZOLOFT) 100 MG tablet, Take 1 tablet (100 mg total) by mouth daily., Disp: 30 tablet, Rfl: 11 .  tolterodine (DETROL LA) 2 MG 24 hr capsule, Take 1 capsule by mouth daily., Disp: , Rfl:   Allergies  Allergen Reactions  . Penicillins    Review of Systems  Constitutional: Negative.  Negative for fever and chills.  HENT: Positive for congestion, postnasal drip and rhinorrhea.   Eyes: Negative.   Respiratory: Positive for cough and sputum production. Negative for shortness of breath and wheezing.   Cardiovascular: Negative.  Negative for chest pain.  Gastrointestinal: Positive for nausea.  Genitourinary: Negative.   Musculoskeletal: Negative.   Skin: Negative.   Neurological: Negative.    Endo/Heme/Allergies: Negative.   Psychiatric/Behavioral: Negative.    Objective  Filed Vitals:   10/28/15 1334  BP: 160/90  Pulse: 87  Temp: 98.2 F (36.8 C)  TempSrc: Oral  Resp: 18  Weight: 212 lb 12.8 oz (96.525 kg)  SpO2: 98%   Physical Exam  Constitutional: She is oriented to person, place, and time and well-developed, well-nourished, and in no distress. No distress.  HENT:  Head: Normocephalic.  Right Ear: External ear normal.  Left Ear: External ear normal.  Slight boggy turbinates and cobblestone appearance to posterior pharynx. No exudates.  Eyes: Conjunctivae and EOM are normal.  Neck: Normal range of motion. Neck supple.  Cardiovascular: Normal rate and regular rhythm.   Pulmonary/Chest: Effort normal and breath sounds normal. She has no wheezes. She has no rales. She exhibits no tenderness.  Abdominal: Soft. Bowel sounds are normal.  Lymphadenopathy:    She has no cervical adenopathy.  Neurological: She is alert and oriented to person, place, and time.  Skin: No rash noted.  Psychiatric: Memory, affect and judgment normal.    Recent Results (from the past 2160 hour(s))  TSH     Status: None   Collection Time: 08/06/15 12:38 PM  Result Value Ref Range   TSH 4.229 0.350 - 4.500 uIU/mL  Hemoglobin A1c     Status: Abnormal   Collection Time: 08/06/15 12:38 PM  Result Value Ref Range   Hgb A1c MFr Bld 8.6 (H) 4.0 - 6.0 %  CBC with Differential/Platelet     Status: None   Collection Time: 08/06/15 12:38 PM  Result Value Ref Range   WBC 6.2 3.6 - 11.0 K/uL   RBC 5.00 3.80 - 5.20 MIL/uL   Hemoglobin 14.0 12.0 - 16.0 g/dL   HCT 41.7 35.0 - 47.0 %   MCV 83.4 80.0 - 100.0 fL   MCH 28.1 26.0 - 34.0 pg   MCHC 33.7 32.0 - 36.0 g/dL   RDW 13.1 11.5 - 14.5 %   Platelets 219 150 - 440 K/uL   Neutrophils Relative % 65 %   Neutro Abs 4.1 1.4 - 6.5 K/uL   Lymphocytes Relative 22 %   Lymphs Abs 1.3 1.0 - 3.6 K/uL   Monocytes Relative 8 %   Monocytes Absolute 0.5  0.2 - 0.9 K/uL   Eosinophils Relative 4 %   Eosinophils Absolute 0.2 0 - 0.7 K/uL   Basophils Relative 1 %   Basophils Absolute 0.0 0 - 0.1 K/uL  Comprehensive metabolic panel     Status: Abnormal   Collection Time: 08/06/15 12:38 PM  Result Value Ref Range   Sodium 136 135 - 145 mmol/L   Potassium 4.5 3.5 - 5.1 mmol/L   Chloride 99 (L) 101 - 111 mmol/L  CO2 29 22 - 32 mmol/L   Glucose, Bld 188 (H) 65 - 99 mg/dL   BUN 16 6 - 20 mg/dL   Creatinine, Ser 0.78 0.44 - 1.00 mg/dL   Calcium 9.4 8.9 - 10.3 mg/dL   Total Protein 8.1 6.5 - 8.1 g/dL   Albumin 4.4 3.5 - 5.0 g/dL   AST 26 15 - 41 U/L   ALT 17 14 - 54 U/L   Alkaline Phosphatase 55 38 - 126 U/L   Total Bilirubin 0.4 0.3 - 1.2 mg/dL   GFR calc non Af Amer >60 >60 mL/min   GFR calc Af Amer >60 >60 mL/min    Comment: (NOTE) The eGFR has been calculated using the CKD EPI equation. This calculation has not been validated in all clinical situations. eGFR's persistently <60 mL/min signify possible Chronic Kidney Disease.    Anion gap 8 5 - 15  Lipid panel     Status: Abnormal   Collection Time: 08/06/15 12:38 PM  Result Value Ref Range   Cholesterol 243 (H) 0 - 200 mg/dL   Triglycerides 214 (H) <150 mg/dL   HDL 48 >40 mg/dL   Total CHOL/HDL Ratio 5.1 RATIO   VLDL 43 (H) 0 - 40 mg/dL   LDL Cholesterol 152 (H) 0 - 99 mg/dL    Comment:        Total Cholesterol/HDL:CHD Risk Coronary Heart Disease Risk Table                     Men   Women  1/2 Average Risk   3.4   3.3  Average Risk       5.0   4.4  2 X Average Risk   9.6   7.1  3 X Average Risk  23.4   11.0        Use the calculated Patient Ratio above and the CHD Risk Table to determine the patient's CHD Risk.        ATP III CLASSIFICATION (LDL):  <100     mg/dL   Optimal  100-129  mg/dL   Near or Above                    Optimal  130-159  mg/dL   Borderline  160-189  mg/dL   High  >190     mg/dL   Very High    Assessment & Plan  1. Cough Recurrent ticklish  cough and occasional white sputum production. Denies dyspepsia/GERD symptoms. Will give Tessalon Perles and check CBC with CXR to rule out infiltrates or other cardiopulmonary disease. Increase fluid intake and recheck pending reports. - benzonatate (TESSALON) 200 MG capsule; Take 1 capsule (200 mg total) by mouth 3 (three) times daily as needed for cough.  Dispense: 30 capsule; Refill: 1 - CBC with Differential/Platelet - DG Chest 2 View; Future  2. Allergic rhinitis, unspecified allergic rhinitis type Boggy turbinates and history of allergic rhinitis. Will refill Loratadine and add Singulair. Recheck pending lab and x-ray report. May need referral to allergist or pulmonologist if symptoms persist. - loratadine (CLARITIN) 10 MG tablet; Take 1 tablet (10 mg total) by mouth daily.  Dispense: 30 tablet; Refill: 3 - montelukast (SINGULAIR) 10 MG tablet; Take 1 tablet (10 mg total) by mouth daily.  Dispense: 30 tablet; Refill: 3

## 2015-10-29 ENCOUNTER — Telehealth: Payer: Self-pay | Admitting: Physician Assistant

## 2015-10-29 NOTE — Telephone Encounter (Signed)
Patient advised as directed below. Patient verbalized understanding and agrees with plan of care.  

## 2015-10-29 NOTE — Telephone Encounter (Signed)
Pt called wanting test results.  cxr and labs  Call back [phone]  Thanks Con Memos

## 2015-10-29 NOTE — Telephone Encounter (Signed)
-----   Message from Margo Common, [place] sent at 10/28/2015  5:50 PM EST ----- Normal chest x-ray. No sign of pneumonia. CBC blood test was normal, also. Suspect she is having a viral illness since having bronchitis. Use medications as prescribed and recheck if no better in 10 days.

## 2015-11-01 ENCOUNTER — Ambulatory Visit: Payer: 59 | Admitting: Physician Assistant

## 2015-11-08 ENCOUNTER — Ambulatory Visit
Admission: EM | Admit: 2015-11-08 | Discharge: 2015-11-08 | Disposition: A | Discharge status: Home / Self Care | Payer: 59 | Attending: Family Medicine | Admitting: Family Medicine

## 2015-11-08 ENCOUNTER — Telehealth: Payer: Self-pay

## 2015-11-08 ENCOUNTER — Encounter: Payer: Self-pay | Admitting: Emergency Medicine

## 2015-11-08 DIAGNOSIS — N390 Urinary tract infection, site not specified: Secondary | ICD-10-CM

## 2015-11-08 LAB — URINALYSIS COMPLETE WITH MICROSCOPIC (ARMC ONLY)
Bilirubin Urine: NEGATIVE
Glucose, UA: 100 mg/dL — AB
Ketones, ur: NEGATIVE mg/dL
Nitrite: NEGATIVE
Protein, ur: 30 mg/dL — AB
Specific Gravity, Urine: 1.025 (ref 1.005–1.030)
pH: 5.5 (ref 5.0–8.0)

## 2015-11-08 MED ORDER — CEFUROXIME AXETIL 500 MG PO TABS: 500.0000 mg | ORAL_TABLET | Freq: Two times a day (BID) | ORAL | Status: AC

## 2015-11-08 MED ORDER — PHENAZOPYRIDINE HCL 200 MG PO TABS: 200.0000 mg | ORAL_TABLET | Freq: Three times a day (TID) | ORAL | Status: DC

## 2015-11-08 NOTE — Telephone Encounter (Signed)
Patient advised as directed below. Per patient thank you she at the urgent care. But is not happy that we couldn't see her. Thinking of switching clinic, very disappointed. I apologize to patient.  Thanks,  -Joseline

## 2015-11-08 NOTE — ED Notes (Signed)
Patient c/o back pain and burning when urinating and increase in urinary urgency since this morning.

## 2015-11-08 NOTE — Telephone Encounter (Signed)
Patient is calling and states that today she started to have bad symptoms of urgency and frequency to urinate, pain and burning. NO fever, no blood visible, no nausea or stomach pain, she states she has been on antibiotics recently too much and that is why she has this. Can we send something in? She is hurting bad to wait till Monday. Please review-aa

## 2015-11-08 NOTE — ED Provider Notes (Addendum)
CSN: [id number]     Arrival date & time 11/08/15  1201 History   First MD Initiated Contact with Patient 11/08/15 1236     Chief Complaint  Patient presents with  . Dysuria   (Consider location/radiation/quality/duration/timing/severity/associated sxs/prior Treatment) HPI Comments: Widowed caucasian female here for evaluation of left low back pain, urinary frequency/burning, cloudy/smell started this am.  PMHx microscopic hematuria  "I think this has been coming on for the last couple of days.  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 female presenting with dysuria. The history is provided by the patient.  Dysuria Pain quality:  Burning Pain severity:  Moderate Onset quality:  Sudden Duration:  6 hours Timing:  Intermittent Progression:  Waxing and waning Chronicity:  New Recent urinary tract infections: no   Relieved by:  Nothing Ineffective treatments:  None tried Urinary symptoms: discolored urine, foul-smelling urine and frequent urination   Urinary symptoms: no hematuria, no hesitancy and no bladder incontinence   Associated symptoms: flank pain   Associated symptoms: no abdominal pain, no fever, no genital lesions, no nausea, no vaginal discharge and no vomiting   Risk factors: no hx of pyelonephritis, no hx of urolithiasis, no kidney transplant, not pregnant, no recurrent urinary tract infections, no renal cysts, no renal disease, not single kidney, no sexually transmitted infections and no urinary catheter     Past Medical History  Diagnosis Date  . Hyperlipidemia   . Hypertension   . Depression   . Joint pain     as reported by patient    . Urinary incontinence     as stated by patient  . Cataract     left  . Anxiety    Past Surgical History  Procedure Laterality Date  . Breast lumpectomy Right 2005    as reported by patient  . Cataract extraction  January 2013  . Breast excisional biopsy Right 1999   Family History  Problem Relation Age of Onset  . Healthy Brother   . Aneurysm Mother     brain  . Diabetes Father   . CAD Father   . Heart failure Father   . Colon cancer Father   . Alzheimer's disease Paternal Grandmother   . Dementia Paternal Grandmother   . Mental illness Paternal Grandfather    Social History  Substance Use Topics  . Smoking status: Former Smoker -- 1.50 packs/day for 25 years    Types: Cigarettes  . Smokeless tobacco: Never Used  . Alcohol Use: 0.0 oz/week    0 Standard drinks or equivalent per week     Comment: 1- every 3-4 months   OB History    No data available     Review of Systems  Constitutional: Negative for fever, chills, diaphoresis, activity change, appetite change and fatigue.  HENT: Negative for congestion, dental problem, drooling, ear discharge, ear pain, facial swelling, hearing loss, mouth sores, nosebleeds, sore throat, trouble swallowing and voice change.   Eyes: Negative for photophobia, pain, discharge, redness, itching and visual disturbance.  Respiratory: Negative for cough, chest tightness, shortness of breath and wheezing.   Cardiovascular: Negative for chest pain and leg swelling.  Gastrointestinal:  Negative for nausea, vomiting, abdominal pain, diarrhea, constipation and blood in stool.  Endocrine: Negative for cold intolerance and heat intolerance.  Genitourinary: Positive for dysuria, urgency, frequency and flank pain. Negative for hematuria, decreased urine volume, vaginal bleeding, vaginal discharge, difficulty urinating, vaginal pain, menstrual problem and pelvic pain.  Musculoskeletal: Positive for back pain. Negative for myalgias, arthralgias and  gait problem.  Skin: Negative for color change, pallor, rash and wound.  Allergic/Immunologic: Positive for environmental allergies. Negative for food allergies.  Neurological: Negative for dizziness, tremors, seizures, syncope, facial asymmetry, speech difficulty, weakness, light-headedness, numbness and headaches.  Hematological: Negative for adenopathy. Does not bruise/bleed easily.  Psychiatric/Behavioral: Negative for confusion, sleep disturbance and agitation. The patient is not nervous/anxious.     Allergies  Penicillins  Home Medications   Prior to Admission medications   Medication Sig Start Date End Date Taking? Authorizing Provider  ALPRAZolam Duanne Moron) 0.5 MG tablet Take 1 tablet (0.5 mg total) by mouth every 8 (eight) hours. 1/2 to 1 tab before work and q8h as needed 08/01/15   Mar Daring, PA-C  aspirin 81 MG tablet Take 81 mg by mouth daily.    Historical Provider, MD  benzonatate (TESSALON) 200 MG capsule Take 1 capsule (200 mg total) by mouth 3 (three) times daily as needed for cough. 10/28/15   Vickki Muff Chrismon, PA  CALCIUM CARBONATE-VIT D-MIN PO Take 1 tablet by mouth daily.    Historical Provider, MD  cefUROXime (CEFTIN) 500 MG tablet Take 1 tablet (500 mg total) by mouth 2 (two) times daily with a meal. 11/08/15 11/14/15  Olen Cordial, NP  Cholecalciferol (VITAMIN D) 2000 UNITS CAPS Take 4 capsules by mouth daily.    Historical Provider, MD  hydrochlorothiazide (HYDRODIURIL) 25 MG tablet Take 1 tablet (25 mg total) by mouth daily. 08/01/15   Mar Daring, PA-C  hydrOXYzine (ATARAX/VISTARIL) 25 MG tablet Take 1 tablet by mouth 3 (three) times daily as needed. 02/11/12   Historical Provider, MD  ibuprofen (ADVIL,MOTRIN) 800 MG tablet Take 1 tablet (800 mg total) by mouth every 8 (eight) hours as needed for moderate pain. 09/30/15   Sable Feil, PA-C  loratadine (CLARITIN) 10 MG tablet Take 1 tablet (10 mg total) by mouth daily. 10/28/15   Vickki Muff  Chrismon, PA  losartan (COZAAR) 25 MG tablet Take 1 tablet (25 mg total) by mouth 2 (two) times daily. 08/01/15   Mar Daring, PA-C  metFORMIN (GLUCOPHAGE) 1000 MG tablet Take 1 tablet (1,000 mg total) by mouth 2 (two) times daily with a meal. 08/01/15   Mar Daring, PA-C  montelukast (SINGULAIR) 10 MG tablet Take 1 tablet (10 mg total) by mouth daily. 10/28/15   Vickki Muff Chrismon, PA  naproxen sodium (ANAPROX) 220 MG tablet Take 220 mg by mouth as needed.    Historical Provider, MD  phenazopyridine (PYRIDIUM) 200 MG tablet Take 1 tablet (200 mg total) by mouth 3 (three) times daily. 11/08/15   Olen Cordial, NP  pravastatin (PRAVACHOL) 40 MG tablet Take 1 tablet (40 mg total) by mouth daily. 08/29/15   Mar Daring, PA-C  psyllium (REGULOID) 0.52 G capsule Take 1 capsule by mouth as directed. 11/10/11   Historical Provider, MD  sertraline (ZOLOFT) 100 MG tablet Take 1 tablet (100 mg total) by mouth daily. 08/29/15   Mar Daring, PA-C  tolterodine (DETROL LA) 2 MG 24 hr capsule Take 1 capsule by mouth daily. 03/28/15   Historical Provider, MD  Meds Ordered and Administered this Visit  Medications - No data to display  BP 180/94 mmHg  Pulse 70  Temp(Src) 96.9 F (36.1 C) (Tympanic)  Resp 16  Ht 5\' 6"  (1.676 m)  Wt 212 lb (96.163 kg)  BMI 34.23 kg/m2  LMP  (Approximate) No data found.   Physical Exam  Constitutional: She is oriented to person, place, and time. She appears well-developed and well-nourished. She is active and cooperative.  Non-toxic appearance. She does not have a sickly appearance. She does not appear ill. No distress.  HENT:  Head: Normocephalic and atraumatic.  Right Ear: Hearing and external ear normal.  Left Ear: Hearing and external ear normal.  Nose: No mucosal edema, rhinorrhea, nose lacerations, sinus tenderness, nasal deformity, septal deviation or nasal septal hematoma. No epistaxis.  No foreign bodies. Right sinus exhibits no  maxillary sinus tenderness and no frontal sinus tenderness. Left sinus exhibits no maxillary sinus tenderness and no frontal sinus tenderness.  Mouth/Throat: Uvula is midline and mucous membranes are normal. Mucous membranes are not pale, not dry and not cyanotic. She does not have dentures. No oral lesions. No trismus in the jaw. Normal dentition. No dental abscesses, uvula swelling, lacerations or dental caries. No oropharyngeal exudate, posterior oropharyngeal edema, posterior oropharyngeal erythema or tonsillar abscesses.  Eyes: Conjunctivae, EOM and lids are normal. Pupils are equal, round, and reactive to light. Right eye exhibits no chemosis, no discharge, no exudate and no hordeolum. No foreign body present in the right eye. Left eye exhibits no chemosis, no discharge, no exudate and no hordeolum. No foreign body present in the left eye. Right conjunctiva is not injected. Right conjunctiva has no hemorrhage. Left conjunctiva is not injected. Left conjunctiva has no hemorrhage. No scleral icterus. Right eye exhibits normal extraocular motion and no nystagmus. Left eye exhibits normal extraocular motion and no nystagmus. Right pupil is round and reactive. Left pupil is round and reactive. Pupils are equal.  Neck: Trachea normal and normal range of motion. Neck supple. No tracheal tenderness, no spinous process tenderness and no muscular tenderness present. No rigidity. No tracheal deviation, no edema, no erythema and normal range of motion present. No thyroid mass and no thyromegaly present.  Cardiovascular: Normal rate, regular rhythm, S1 normal, S2 normal, normal heart sounds and intact distal pulses.  PMI is not displaced.  Exam reveals no gallop and no friction rub.   No murmur heard. Pulmonary/Chest: Effort normal and breath sounds normal. No accessory muscle usage or stridor. No respiratory distress. She has no decreased breath sounds. She has no wheezes. She has no rhonchi. She has no rales. She  exhibits no tenderness.  Abdominal: Soft. Normal appearance and bowel sounds are normal. She exhibits no shifting dullness, no distension, no pulsatile liver, no fluid wave, no abdominal bruit, no ascites, no pulsatile midline mass and no mass. There is no hepatosplenomegaly. There is no tenderness. There is no rigidity, no rebound, no guarding, no CVA tenderness, no tenderness at McBurney's point and negative Murphy's sign. Hernia confirmed negative in the ventral area.  Dull to percussion x 4 quads  Musculoskeletal: Normal range of motion. She exhibits no edema or tenderness.       Right shoulder: Normal.       Left shoulder: Normal.       Right hip: Normal.       Left hip: Normal.       Right knee: Normal.       Left knee: Normal.  Cervical back: Normal.       Lumbar back: She exhibits pain.       Back:       Right hand: Normal.       Left hand: Normal.  Lymphadenopathy:       Head (right side): No submental, no submandibular, no tonsillar, no preauricular, no posterior auricular and no occipital adenopathy present.       Head (left side): No submental, no submandibular, no tonsillar, no preauricular, no posterior auricular and no occipital adenopathy present.    She has no cervical adenopathy.       Right cervical: No superficial cervical, no deep cervical and no posterior cervical adenopathy present.      Left cervical: No superficial cervical, no deep cervical and no posterior cervical adenopathy present.  Neurological: She is alert and oriented to person, place, and time. She has normal strength. She is not disoriented. She displays no atrophy and no tremor. No cranial nerve deficit or sensory deficit. She exhibits normal muscle tone. She displays no seizure activity. Coordination and gait normal. GCS eye subscore is 4. GCS verbal subscore is 5. GCS motor subscore is 6.  Skin: Skin is warm, dry and intact. No abrasion, no bruising, no burn, no ecchymosis, no laceration, no lesion,  no petechiae and no rash noted. She is not diaphoretic. No cyanosis or erythema. No pallor. Nails show no clubbing.  Psychiatric: She has a normal mood and affect. Her speech is normal and behavior is normal. Judgment and thought content normal. Cognition and memory are normal.  Nursing note and vitals reviewed.   ED Course  Procedures (including critical care time)  Labs Review Labs Reviewed  URINALYSIS COMPLETEWITH MICROSCOPIC ([facility] ONLY) - Abnormal; Notable for the following:    APPearance CLOUDY (*)    Glucose, UA 100 (*)    Hgb urine dipstick 2+ (*)    Protein, ur 30 (*)    Leukocytes, UA 2+ (*)    Bacteria, UA MANY (*)    Squamous Epithelial / LPF 0-5 (*)    All other components within normal limits  URINE CULTURE   Previous urine culture results reviewed mixed bacteria no resistance history Imaging Review No results found.  1310 Discussed urinalysis results with patient UTI urine culture pending typically 48 hours will call with results once available.  Follow up with PCM in 2 weeks for repeat urinalysis.  Patient given copy of urinalysis report.  Hydrate Hydrate hydrate.  Patient verbalized understanding of information/instructions, agreed with plan of care and had no further questions at this time.  Filed Vitals:   11/08/15 1213 11/08/15 1314  BP: 180/94 184/93  Pulse: 70 70  Temp: 96.9 F (36.1 C) 98 F (36.7 C)  Resp: 16 16  patient to go home and take her regular medications for hypertension and eat lunch, hydrate and start ceftin 500mg  po BID and pyridium 200mg  po TID prn as prescribed  Patient verbalized understanding of information/instructions and agreed with plan of care and had no further questions at this time  1325 MDM   1. UTI (lower urinary tract infection)    Penicillin allergy rash 30 years ago.  Bactrim and cipro interact with chronic medications patient does not want to take them.  Ceftin 500mg  po BID x 7 days.  Urine culture pending will call with  results once available.  Discussed with patient 5% chance cross reactivity/allergy penicillin and cephalosporins will monitor for symptoms and stop medications if notes rash, itching,  dyspnea and contact clinic.  Medications as directed.  Patient is also to push fluids and may use Pyridium 200mg  po TID as needed.  Hydrate, avoid dehydration.  Avoid holding urine void on frequent basis every 4 to 6 hours.  If unable to void every 8 hours, tea color urine or blood clots, worsening back pain follow up for re-evaluation with PCM, urgent care or ER.   Call or return to clinic as needed if these symptoms worsen or fail to improve as anticipated.  Exitcare handout on cystitis given to patient Patient verbalized agreement and understanding of treatment plan and had no further questions at this time. P2:  Hydrate and cranberry juice   Olen Cordial, NP 11/08/15 1325  26 Nov 2015 Telephone message left for patient urine culture e.coli sensitive to ceftin.  Wanted to ensure her symptoms resolved this is message #2 for patient.  Will send letter to address on file.  Olen Cordial, NP 11/26/15 1501

## 2015-11-08 NOTE — Telephone Encounter (Signed)
Please review-aa 

## 2015-11-08 NOTE — Discharge Instructions (Signed)

## 2015-11-08 NOTE — Telephone Encounter (Signed)
She prob needs to go to urgent care to make sure it is uti not a yeast infection.

## 2015-11-10 LAB — URINE CULTURE
Culture: >=100000
Special Requests: NORMAL

## 2015-11-11 ENCOUNTER — Telehealth: Payer: Self-pay | Admitting: Family Medicine

## 2015-11-11 NOTE — Telephone Encounter (Signed)
Telephone message left for patient urine culture results e.coli sensitive to ceftin which was prescribed.  Instructed patient to contact clinic and speak to nurse if symptoms not improving/resolved by completion of antibiotic course or if further questions regarding test results.

## 2015-12-03 ENCOUNTER — Other Ambulatory Visit: Payer: Self-pay

## 2015-12-03 NOTE — Patient Outreach (Signed)
[place] [facility]) Care Management  12/03/2015  KATHARINE GALM 02-20-1956 [id number]   Spoke to patient via phone.  Scheduled next visit with me on 12/25/15 @ 12:30pm.   Gentry Fitz, RN, BA, [place], [place]:  [phone]  Fax:  [phone] E-mail: [email or web address] [address] [place], [place]  [address]

## 2015-12-05 NOTE — Patient Outreach (Signed)
[place] [facility]) Care Management  12/05/2015  SHAINDY STAVIG [birth year] [id number]   Scheduled patient for 12/25/15.   Gentry Fitz, RN, BA, [place], [place] Direct Dial:  [phone]  Fax:  [phone] E-mail: [email or web address] [address] [place], [place]  [address]

## 2015-12-18 ENCOUNTER — Other Ambulatory Visit: Payer: Self-pay | Admitting: Physician Assistant

## 2015-12-18 DIAGNOSIS — E1142 Type 2 diabetes mellitus with diabetic polyneuropathy: Secondary | ICD-10-CM

## 2015-12-18 MED ORDER — TRUEPLUS LANCETS 26G MISC: 1.0000 | Freq: Three times a day (TID) | Status: DC

## 2015-12-18 MED ORDER — GLUCOSE BLOOD VI STRP: ORAL_STRIP | Status: DC

## 2015-12-25 ENCOUNTER — Ambulatory Visit: Payer: Self-pay

## 2015-12-30 ENCOUNTER — Other Ambulatory Visit: Payer: Self-pay

## 2015-12-30 VITALS — BP 134/70 | Ht 67.0 in | Wt 207.4 lb

## 2015-12-30 DIAGNOSIS — E1165 Type 2 diabetes mellitus with hyperglycemia: Secondary | ICD-10-CM

## 2015-12-30 LAB — POCT GLYCOSYLATED HEMOGLOBIN (HGB A1C): Hemoglobin A1C: 8.5

## 2015-12-30 NOTE — Patient Outreach (Signed)
[place] [facility]) Care Management  [place]  12/30/2015   [name] 1956/10/28 [id number]  Subjective: Patient comes to me for a follow up visit in the Link to Wellness program.  Her meter has no blood sugar readings for the last 7 days; she tells me she's been getting an error message. She is not exercising.   Objective:  Filed Vitals:   12/30/15 1202  BP: 134/70  Height: 1.702 m (_0 )  Weight: 207 lb 6.4 oz (94.076 kg)   POCT A1C 8.5%  Current Medications:  Current Outpatient Prescriptions  Medication Sig Dispense Refill  . ALPRAZolam (XANAX) 0.5 MG tablet Take 1 tablet (0.5 mg total) by mouth every 8 (eight) hours. 1/2 to 1 tab before work and q8h as needed 30 tablet 1  . aspirin 81 MG tablet Take 81 mg by mouth daily.    Marland Kitchen CALCIUM CARBONATE-VIT D-MIN PO Take 1 tablet by mouth daily. Reported on 12/30/2015    . glucose blood test strip Check blood sugar 4 times daily. 100 each 12  . hydrochlorothiazide (HYDRODIURIL) 25 MG tablet Take 1 tablet (25 mg total) by mouth daily. 30 tablet 6  . ibuprofen (ADVIL,MOTRIN) 800 MG tablet Take 1 tablet (800 mg total) by mouth every 8 (eight) hours as needed for moderate pain. 15 tablet 0  . losartan (COZAAR) 25 MG tablet Take 1 tablet (25 mg total) by mouth 2 (two) times daily. 60 tablet 6  . metFORMIN (GLUCOPHAGE) 1000 MG tablet Take 1 tablet (1,000 mg total) by mouth 2 (two) times daily with a meal. 60 tablet 6  . montelukast (SINGULAIR) 10 MG tablet Take 1 tablet (10 mg total) by mouth daily. 30 tablet 3  . naproxen sodium (ANAPROX) 220 MG tablet Take 220 mg by mouth as needed.    . pravastatin (PRAVACHOL) 40 MG tablet Take 1 tablet (40 mg total) by mouth daily. 30 tablet 11  . sertraline (ZOLOFT) 100 MG tablet Take 1 tablet (100 mg total) by mouth daily. 30 tablet 11  . TRUEPLUS LANCETS 26G MISC 1 Device by Does not apply route 4 (four) times daily -  with meals and at bedtime. 100 each 12  . benzonatate  (TESSALON) 200 MG capsule Take 1 capsule (200 mg total) by mouth 3 (three) times daily as needed for cough. (Patient not taking: Reported on 12/30/2015) 30 capsule 1  . Cholecalciferol (VITAMIN D) 2000 UNITS CAPS Take 4 capsules by mouth daily. Reported on 12/30/2015    . hydrOXYzine (ATARAX/VISTARIL) 25 MG tablet Take 1 tablet by mouth 3 (three) times daily as needed. Reported on 12/30/2015    . loratadine (CLARITIN) 10 MG tablet Take 1 tablet (10 mg total) by mouth daily. (Patient not taking: Reported on 12/30/2015) 30 tablet 3  . phenazopyridine (PYRIDIUM) 200 MG tablet Take 1 tablet (200 mg total) by mouth 3 (three) times daily. (Patient not taking: Reported on 12/30/2015) 6 tablet 0  . psyllium (REGULOID) 0.52 G capsule Take 1 capsule by mouth as directed. Reported on 12/30/2015    . tolterodine (DETROL LA) 2 MG 24 hr capsule Take 1 capsule by mouth daily. Reported on 12/30/2015     No current facility-administered medications for this visit.    Functional Status:  In your present state of health, do you have any difficulty performing the following activities: 12/30/2015 09/11/2015  Hearing? N N  Vision? N N  Difficulty concentrating or making decisions? N N  Walking or climbing stairs? N  N  Dressing or bathing? N N  Doing errands, shopping? N N    Fall/Depression Screening: PHQ 2/9 Scores 12/30/2015 09/11/2015 05/27/2015  PHQ - 2 Score 0 2 0  PHQ- 9 Score - 8 -    Assessment: Observed Deamber checking a blood sugar - she has forgotten how to do and is trying to get her meter to 0 before being able to use it- reviewed technique- blood sugar 158m/dl. TAislinand I discussed the need for her to get preventative tests to stay healthy and she committed to having them done before the end of the year- she has been successful in getting her mammogram and MD visit but still needs her dilated eye and dental cleaning.  We reviewed meal planning- she has a very good understanding of carbs/protein/"free  vegetables".  She drinks diet drinks or water.  Although she knows the information, she doesn't always follow the guidelines.  Often she'll just eat cereal or oatmeal for breakfast; would do better by adding protein or try an egg, cheese of peanut butter sandwich.   Plan:  [facility] Problem One        Most Recent Value   Care Plan Problem One  needs preventative care- dental, eye eams- will complete by the end of the year   Role Documenting the Problem One  Care Management CGreat Meadowsfor Problem One  Not Active [1. schedule dilated eye exam  2. schedule cataract surgery  3. schedule dental cleaning]   THN Long Term Goal (31-90 days)  Complete preventative exams by the end of 2016   [facility] Start Date  05/27/15   [facility] Goal Met Date  12/30/15 [not completed- see notes]   Interventions for Problem One Long Term Goal  Schedule each visit (dental, dilated eye, full physical, mammogram, cataract, partial plate) 1 per week on your days off .  Buy dental picks and  begin flossing    THN CM Care Plan Problem Two        Most Recent Value   Care Plan Problem Two  Elevated A1C   Role Documenting the Problem Two  Care Management Coordinator   Care Plan for Problem Two  Active   Interventions for Problem Two Long Term Goal   1. check sugar 2-3 times per week (aim for everyday) when she wakes or before she goes to work   TCHS IncLong Term Goal (31-90) days  Patient will lower her A1C to less than 8.5% by next visit   TCairoGoal Start Date  12/30/15     Patient has also committed to scheduling her MD visit by April 2017 and getting her dental cleaning by April 25th.  I will follow up with her in 1 month.     JGentry Fitz RN, BA, [place]Direct Dial:  [phone] Fax:  [phone]-mail: [email or web address] [address] [place]  [address]

## 2016-01-23 ENCOUNTER — Other Ambulatory Visit: Payer: Self-pay | Admitting: Physician Assistant

## 2016-01-23 DIAGNOSIS — F419 Anxiety disorder, unspecified: Secondary | ICD-10-CM

## 2016-01-24 NOTE — Telephone Encounter (Signed)
Prescription for Alprazolam was called in to [facility] pharmacy.  Thanks,  -Joseline

## 2016-01-27 ENCOUNTER — Other Ambulatory Visit: Payer: Self-pay

## 2016-01-27 NOTE — Patient Outreach (Signed)
[place] [facility]) Care Management  01/27/2016  JAKYRA OFFNER 27-Aug-1956 [id number]   Called patient to update her Link to Wellness information. She was unavailable- I will call her again later this week.   Gentry Fitz, RN, BA, [place], [place] Direct Dial:  [phone]  Fax:  [phone] E-mail: [email or web address] [address], [place], [place]  [address]

## 2016-01-28 ENCOUNTER — Other Ambulatory Visit: Payer: Self-pay

## 2016-01-28 NOTE — Patient Outreach (Signed)
[place] [facility]) Care Management  01/28/2016  [name] [birth year] [id number]   De Burrs, left a message to update me on her blood sugar readings.    Gentry Fitz, RN, BA, [place], [place] Direct Dial:  [phone]  Fax:  [phone] E-mail: [email or web address] [address] [place], [place]  [address]

## 2016-01-29 ENCOUNTER — Other Ambulatory Visit: Payer: Self-pay

## 2016-01-29 NOTE — Patient Outreach (Signed)
[place] [facility]) Care Management  01/29/2016  [name] [birth year] [id number]   [name] has left me a message; she was sorry she had missed my calls.     Gentry Fitz, RN, BA, [place], [place] Direct Dial:  [phone]  Fax:  [phone] E-mail: [email or web address] [address] [place], [place]  [address]

## 2016-02-03 NOTE — Patient Outreach (Signed)
[place] [facility]) Care Management  02/03/2016  [name] 1955-12-24 [id number]   I spoke with [name] on the phone- we have scheduled her to see me next week.  She has made a doctor's appointment for March 16, 2016.   Gentry Fitz, RN, BA, [place], [place] Direct Dial:  [phone]  Fax:  [phone] E-mail: [name] [address] [place], [place]  [address]

## 2016-02-04 ENCOUNTER — Telehealth: Payer: Self-pay | Admitting: Physician Assistant

## 2016-02-04 ENCOUNTER — Encounter: Payer: Self-pay | Admitting: Physician Assistant

## 2016-02-04 ENCOUNTER — Ambulatory Visit: Payer: Self-pay | Admitting: Physician Assistant

## 2016-02-04 VITALS — BP 150/80 | HR 64 | Temp 97.8°F

## 2016-02-04 DIAGNOSIS — R319 Hematuria, unspecified: Secondary | ICD-10-CM

## 2016-02-04 DIAGNOSIS — R300 Dysuria: Secondary | ICD-10-CM

## 2016-02-04 DIAGNOSIS — N390 Urinary tract infection, site not specified: Secondary | ICD-10-CM

## 2016-02-04 LAB — POCT URINALYSIS DIPSTICK
Bilirubin, UA: NEGATIVE
Glucose, UA: NEGATIVE
Ketones, UA: NEGATIVE
Nitrite, UA: NEGATIVE
Protein, UA: NEGATIVE
Spec Grav, UA: 1.015
Urobilinogen, UA: 0.2
pH, UA: 5.5

## 2016-02-04 MED ORDER — CIPROFLOXACIN HCL 250 MG PO TABS: 250.0000 mg | ORAL_TABLET | Freq: Two times a day (BID) | ORAL | Status: DC

## 2016-02-04 NOTE — Telephone Encounter (Signed)
Advised patient as directed below. Patient wanted to schedule an appointment tomorrow with Tawanna Sat put her on the schedule at 11:30.  Thanks,  -Kaylene Dawn

## 2016-02-04 NOTE — Progress Notes (Signed)
S:  C/o uti sx for 6-7 days, burning, urgency, frequency, denies vaginal discharge, abdominal pain or flank pain:  Remainder ros neg  O:  Vitals wnl, nad, no cva tenderness, back nontender, lungs c t a,cv rrr, abd soft nontender, bs normal, n/v intact  A: uti  P: cipro 250mg  bid x 7d, increase water intake, add cranberry juice, return if not improving in 2 -3 days, return earlier if worsening, discussed pyelonephritis sx

## 2016-02-04 NOTE — Telephone Encounter (Signed)
Pink eye is caused by a virus and eye drops do not help. Using eye drops do not make it any less infectious. If she is having eye pain or sensitivity to light, or if it does not clear up within three days she should be seen in the office.

## 2016-02-04 NOTE — Telephone Encounter (Signed)
Dr. Caryn Section can you please review.This is [place] patient. Patient wants something to be call in to the [facility] employee pharmacy for her pink eye so that she can go to work tomorrow.  Thanks,  -Joseline

## 2016-02-04 NOTE — Telephone Encounter (Signed)
Pt called saying she has pink eye that she has gotten from her grandson.  Her call back is [phone]  She uses [place].  Thanks Con Memos

## 2016-02-04 NOTE — Telephone Encounter (Signed)
Please advise.  Thanks,  -Jovaun Levene 

## 2016-02-05 ENCOUNTER — Encounter: Payer: Self-pay | Admitting: Physician Assistant

## 2016-02-05 ENCOUNTER — Ambulatory Visit (INDEPENDENT_AMBULATORY_CARE_PROVIDER_SITE_OTHER): Payer: 59 | Admitting: Physician Assistant

## 2016-02-05 VITALS — BP 140/82 | HR 62 | Temp 98.1°F | Resp 16 | Wt 209.2 lb

## 2016-02-05 DIAGNOSIS — H109 Unspecified conjunctivitis: Secondary | ICD-10-CM | POA: Diagnosis not present

## 2016-02-05 MED ORDER — CIPROFLOXACIN HCL 0.3 % OP SOLN: 1.0000 [drp] | OPHTHALMIC | Status: DC

## 2016-02-05 NOTE — Progress Notes (Signed)
Patient: [name] Female    DOB: 27-Dec-1955   60 y.o.   MRN: [id number] Visit Date: 02/05/2016  Today's Provider: Mar Daring, PA-C   Chief Complaint  Patient presents with  . Conjunctivitis   Subjective:    Conjunctivitis  The current episode started yesterday. The onset was sudden. The problem occurs rarely. The problem has been unchanged (Patient's grandchildren were diagnosed with conjuctivitis.). Nothing relieves the symptoms. Nothing aggravates the symptoms. Associated symptoms include eye itching, congestion, cough, eye discharge and eye redness. Pertinent negatives include no fever, no decreased vision, no double vision, no abdominal pain, no constipation, no diarrhea, no nausea, no vomiting, no headaches, no muscle aches, no URI, no wheezing and no eye pain. The eye pain is mild. Both eyes are affected.The eye pain is not associated with movement. The rhinorrhea has been occurring rarely. The nasal discharge has a clear appearance.   Patient needs a note to go back to work. [facility] the [facility] was seen by Ashok Cordia yesterday for a UTI and was prescribed Cipro 250 mg.     Allergies  Allergen Reactions  . Penicillins    Previous Medications   ALPRAZOLAM (XANAX) 0.5 MG TABLET    TAKE ONE TABLET BY MOUTH EVERY 8 HOURS (1/2 TO 1 TABLET BEFORE WORK AND EVERY 8 HOURS AS NEEDED)   ASPIRIN 81 MG TABLET    Take 81 mg by mouth daily.   BENZONATATE (TESSALON) 200 MG CAPSULE    Take 1 capsule (200 mg total) by mouth 3 (three) times daily as needed for cough.   CALCIUM CARBONATE-VIT D-MIN PO    Take 1 tablet by mouth daily. Reported on 12/30/2015   CHOLECALCIFEROL (VITAMIN D) 2000 UNITS CAPS    Take 4 capsules by mouth daily. Reported on 02/05/2016   CIPROFLOXACIN (CIPRO) 250 MG TABLET    Take 1 tablet (250 mg total) by mouth 2 (two) times daily.   GLUCOSE BLOOD TEST STRIP    Check blood sugar 4 times daily.   HYDROCHLOROTHIAZIDE (HYDRODIURIL)  25 MG TABLET    Take 1 tablet (25 mg total) by mouth daily.   HYDROXYZINE (ATARAX/VISTARIL) 25 MG TABLET    Take 1 tablet by mouth 3 (three) times daily as needed. Reported on 02/04/2016   IBUPROFEN (ADVIL,MOTRIN) 800 MG TABLET    Take 1 tablet (800 mg total) by mouth every 8 (eight) hours as needed for moderate pain.   LORATADINE (CLARITIN) 10 MG TABLET    Take 1 tablet (10 mg total) by mouth daily.   LOSARTAN (COZAAR) 25 MG TABLET    Take 1 tablet (25 mg total) by mouth 2 (two) times daily.   METFORMIN (GLUCOPHAGE) 1000 MG TABLET    Take 1 tablet (1,000 mg total) by mouth 2 (two) times daily with a meal.   MONTELUKAST (SINGULAIR) 10 MG TABLET    Take 1 tablet (10 mg total) by mouth daily.   NAPROXEN SODIUM (ANAPROX) 220 MG TABLET    Take 220 mg by mouth as needed.   PHENAZOPYRIDINE (PYRIDIUM) 200 MG TABLET    Take 1 tablet (200 mg total) by mouth 3 (three) times daily.   PRAVASTATIN (PRAVACHOL) 40 MG TABLET    Take 1 tablet (40 mg total) by mouth daily.   PSYLLIUM (REGULOID) 0.52 G CAPSULE    Take 1 capsule by mouth as directed. Reported on 12/30/2015   SERTRALINE (ZOLOFT) 100 MG TABLET  Take 1 tablet (100 mg total) by mouth daily.   TOLTERODINE (DETROL LA) 2 MG 24 HR CAPSULE    Take 1 capsule by mouth daily. Reported on 02/05/2016   TRUEPLUS LANCETS 26G MISC    1 Device by Does not apply route 4 (four) times daily -  with meals and at bedtime.    Review of Systems  Constitutional: Negative for fever.  HENT: Positive for congestion. Negative for postnasal drip, sinus pressure and sneezing.   Eyes: Positive for discharge, redness and itching. Negative for double vision and pain.  Respiratory: Positive for cough. Negative for chest tightness, shortness of breath and wheezing.   Cardiovascular: Negative for chest pain.  Gastrointestinal: Negative for nausea, vomiting, abdominal pain, diarrhea and constipation.  Neurological: Negative for dizziness and headaches.    Social History  Substance  Use Topics  . Smoking status: Former Smoker -- 1.50 packs/day for 25 years    Types: Cigarettes  . Smokeless tobacco: Never Used  . Alcohol Use: 0.0 oz/week    0 Standard drinks or equivalent per week     Comment: 1- every 3-4 months   Objective:   BP 140/82 mmHg  Pulse 62  Temp(Src) 98.1 F (36.7 C) (Oral)  Resp 16  Wt 209 lb 3.2 oz (94.892 kg)  LMP  (Approximate)  Physical Exam  Constitutional: She appears well-developed and well-nourished. No distress.  Eyes: EOM are normal. Pupils are equal, round, and reactive to light. Right eye exhibits discharge and exudate. Left eye exhibits no exudate. Right conjunctiva is injected. Left conjunctiva is not injected. No scleral icterus.  Cardiovascular: Normal rate, regular rhythm and normal heart sounds.  Exam reveals no gallop and no friction rub.   No murmur heard. Pulmonary/Chest: Effort normal and breath sounds normal. No respiratory distress. She has no wheezes. She has no rales.  Skin: She is not diaphoretic.  Vitals reviewed.       Assessment & Plan:     1. Conjunctivitis of right eye Worsening symptoms. Will treat with Cipro ophthalmic solution as below. Advised her to use warm compresses. Advised her to use a disposable clean and to white boy drainage and through the Kleenex away following use. Also advised to make sure she is continuing with good hand hygiene and washing her hands after any time she comes in contact with her eye. The office is symptoms failed to improve or worsen. - ciprofloxacin (CILOXAN) 0.3 % ophthalmic solution; Place 1 drop into the right eye every 4 (four) hours while awake. X 7 days  Dispense: 5 mL; Refill: 0       Mar Daring, PA-C  [facility]

## 2016-02-05 NOTE — Patient Instructions (Signed)

## 2016-02-11 ENCOUNTER — Other Ambulatory Visit: Payer: Self-pay

## 2016-02-11 VITALS — BP 156/66 | Ht 67.0 in | Wt 207.5 lb

## 2016-02-11 DIAGNOSIS — E1165 Type 2 diabetes mellitus with hyperglycemia: Secondary | ICD-10-CM

## 2016-02-11 NOTE — Patient Outreach (Signed)
[facility]) Care Management  [place]  02/11/2016   KELVIN BUSTILLOS 1956/08/28 [id number]  Subjective: Alandria in to see me for Link to Wellness visit- she had her meter but has not checked in the last 7 days because she said she did not have strips. 14 day average 176mg /dl and 30 day average was 223mg /dl. Moorea reports that she has been going to the gym every day for 10 minutes of walking.  She sets a goal of wanting to lose 5 lbs by our next visit in 1 month.  Objective:  Filed Vitals:   02/11/16 1323  BP: 156/66  Height: 1.702 m (5\' 7" )  Weight: 207 lb 8 oz (94.121 kg)     Current Medications:  Current Outpatient Prescriptions  Medication Sig Dispense Refill  . ALPRAZolam (XANAX) 0.5 MG tablet TAKE ONE TABLET BY MOUTH EVERY 8 HOURS (1/2 TO 1 TABLET BEFORE WORK AND EVERY 8 HOURS AS NEEDED) 30 tablet 1  . aspirin 81 MG tablet Take 81 mg by mouth daily.    . ciprofloxacin (CILOXAN) 0.3 % ophthalmic solution Place 1 drop into the right eye every 4 (four) hours while awake. X 7 days 5 mL 0  . glucose blood test strip Check blood sugar 4 times daily. 100 each 12  . hydrochlorothiazide (HYDRODIURIL) 25 MG tablet Take 1 tablet (25 mg total) by mouth daily. 30 tablet 6  . hydrOXYzine (ATARAX/VISTARIL) 25 MG tablet Take 1 tablet by mouth 3 (three) times daily as needed. Reported on 02/04/2016    . ibuprofen (ADVIL,MOTRIN) 800 MG tablet Take 1 tablet (800 mg total) by mouth every 8 (eight) hours as needed for moderate pain. 15 tablet 0  . losartan (COZAAR) 25 MG tablet Take 1 tablet (25 mg total) by mouth 2 (two) times daily. 60 tablet 6  . metFORMIN (GLUCOPHAGE) 1000 MG tablet Take 1 tablet (1,000 mg total) by mouth 2 (two) times daily with a meal. 60 tablet 6  . montelukast (SINGULAIR) 10 MG tablet Take 1 tablet (10 mg total) by mouth daily. 30 tablet 3  . naproxen sodium (ANAPROX) 220 MG tablet Take 220 mg by mouth as needed.    . pravastatin (PRAVACHOL) 40 MG  tablet Take 1 tablet (40 mg total) by mouth daily. 30 tablet 11  . sertraline (ZOLOFT) 100 MG tablet Take 1 tablet (100 mg total) by mouth daily. 30 tablet 11  . TRUEPLUS LANCETS 26G MISC 1 Device by Does not apply route 4 (four) times daily -  with meals and at bedtime. 100 each 12  . benzonatate (TESSALON) 200 MG capsule Take 1 capsule (200 mg total) by mouth 3 (three) times daily as needed for cough. (Patient not taking: Reported on 12/30/2015) 30 capsule 1  . CALCIUM CARBONATE-VIT D-MIN PO Take 1 tablet by mouth daily. Reported on 02/11/2016    . Cholecalciferol (VITAMIN D) 2000 UNITS CAPS Take 4 capsules by mouth daily. Reported on 02/11/2016    . ciprofloxacin (CIPRO) 250 MG tablet Take 1 tablet (250 mg total) by mouth 2 (two) times daily. (Patient not taking: Reported on 02/11/2016) 14 tablet 0  . loratadine (CLARITIN) 10 MG tablet Take 1 tablet (10 mg total) by mouth daily. (Patient not taking: Reported on 12/30/2015) 30 tablet 3  . phenazopyridine (PYRIDIUM) 200 MG tablet Take 1 tablet (200 mg total) by mouth 3 (three) times daily. (Patient not taking: Reported on 12/30/2015) 6 tablet 0  . psyllium (REGULOID) 0.52 G capsule Take 1  capsule by mouth as directed. Reported on 02/11/2016    . tolterodine (DETROL LA) 2 MG 24 hr capsule Take 1 capsule by mouth daily. Reported on 02/11/2016     No current facility-administered medications for this visit.    Functional Status:  In your present state of health, do you have any difficulty performing the following activities: 12/30/2015 09/11/2015  Hearing? N N  Vision? N N  Difficulty concentrating or making decisions? N N  Walking or climbing stairs? N N  Dressing or bathing? N N  Doing errands, shopping? N N    Fall/Depression Screening: PHQ 2/9 Scores 02/11/2016 12/30/2015 09/11/2015 05/27/2015  PHQ - 2 Score 0 0 2 0  PHQ- 9 Score - - 8 -    Assessment: Phelicia is not checking blood sugars and has not had her dental visit or dilated eye exam  (although she said going to these appointments were her priority in 2016).  She reports taking her meds but A1C is still 8.5% (taken on 12/30/15).  She is not eating three meals a day (she eats 2 meals and a snack) She asked for support with planning meals- I have shared with her, a 14 day diabetes meal plan with 1500 calorie meal plan- the goal is to get her to eat a varied meal with healthy fats, protein and carbs  Plan: Danyele is going to try to follow the meal plan I have given her, eat three meals per day and stay away from any beverages with carbohydrates. Continue exercise most days of the week, increasing to 150 minutes per week.   I will follow up with her in 1 month. Check sugars 2 x/day.   Gentry Fitz, RN, BA, [place], [place] Direct Dial:  [phone]  Fax:  [phone] E-mail: [email or web address] [address] [place], [place]  [address]

## 2016-02-13 ENCOUNTER — Ambulatory Visit: Payer: Self-pay | Admitting: Registered Nurse

## 2016-02-13 ENCOUNTER — Encounter: Payer: Self-pay | Admitting: Registered Nurse

## 2016-02-13 VITALS — BP 126/80 | Temp 98.4°F

## 2016-02-13 DIAGNOSIS — J309 Allergic rhinitis, unspecified: Secondary | ICD-10-CM

## 2016-02-13 DIAGNOSIS — H109 Unspecified conjunctivitis: Secondary | ICD-10-CM

## 2016-02-13 DIAGNOSIS — J301 Allergic rhinitis due to pollen: Secondary | ICD-10-CM

## 2016-02-13 MED ORDER — MONTELUKAST SODIUM 10 MG PO TABS: 10.0000 mg | ORAL_TABLET | Freq: Every day | ORAL | Status: DC

## 2016-02-13 MED ORDER — CIPROFLOXACIN HCL 0.3 % OP SOLN: 2.0000 [drp] | OPHTHALMIC | Status: AC

## 2016-02-13 MED ORDER — LORATADINE 10 MG PO TABS: 10.0000 mg | ORAL_TABLET | Freq: Every day | ORAL | Status: DC

## 2016-02-13 NOTE — Progress Notes (Signed)
Subjective:    Patient ID: [name], female    DOB: Oct 19, 1956, 60 y.o.   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  Grandchildren with pink eye.  Right eye symptoms worse than left itching, matted in am yellow, discomfort and redness bilaterally.  Patient reported she is unable to put ointment in her eye able to instill eye drops without difficulty.  Changing pillow cases daily and washing in hot water.  Hx seasonal spring allergies currently not taking claritin or singulair as ran out of medication and appt pending with PCM later this month requesting refills as post nasal drip causing cough and sore throat.  Denied fever, headache, photophobia, blurred vision, dizzyness, visual field loss, nausea, vomiting, hemoptysis, sob, dyspnea, wheezing.  Conjunctivitis  The current episode started more than 1 week ago. The onset was sudden. The problem occurs frequently. The problem has been gradually worsening. The problem is mild. Nothing relieves the symptoms. Associated symptoms include eye itching, congestion, rhinorrhea, cough, URI, eye discharge, eye pain and eye redness. Pertinent negatives include no orthopnea, no fever, no decreased vision, no double vision, no photophobia, no abdominal pain, no constipation, no diarrhea, no nausea, no vomiting, no ear discharge, no ear pain, no headaches, no hearing loss, no mouth sores, no sore throat, no stridor, no swollen glands, no muscle aches, no neck pain, no neck stiffness, no wheezing, no rash and no diaper rash. The eye pain is mild. Both eyes are affected.The eye pain is not associated with movement. The eyelid exhibits redness. She has been behaving normally. She has been eating and drinking  normally. There were sick contacts at home. Recently, medical care has been given by the PCP. Services received include medications given.      Review of Systems  Constitutional: Negative for fever, chills, diaphoresis, activity change, appetite change and fatigue.  HENT: Positive for congestion, postnasal drip, rhinorrhea and sneezing. Negative for dental problem, drooling, ear discharge, ear pain, hearing loss, mouth sores, nosebleeds, sinus pressure, sore throat, tinnitus, trouble swallowing and voice change.   Eyes: Positive for pain, discharge, redness and itching. Negative for double vision, photophobia and visual disturbance.  Respiratory: Positive for cough. Negative for wheezing and stridor.   Cardiovascular: Negative for chest pain, palpitations, orthopnea and leg swelling.  Gastrointestinal: Negative for nausea, vomiting, abdominal pain, diarrhea and constipation.  Endocrine: Negative for cold intolerance and heat intolerance.  Genitourinary: Negative for dysuria.  Musculoskeletal: Negative for neck pain.  Skin: Negative for rash.  Allergic/Immunologic: Positive for environmental allergies. Negative for food allergies and immunocompromised state.  Neurological: Negative for dizziness, tremors, seizures, syncope, facial asymmetry, speech difficulty, weakness, light-headedness, numbness and headaches.  Hematological: Negative for adenopathy. Does not bruise/bleed easily.  Psychiatric/Behavioral: Negative for sleep disturbance.       Objective:   Physical Exam  Constitutional: She is oriented to person, place, and time. She appears well-developed and well-nourished. She is active and cooperative.  Non-toxic appearance. She does not have a sickly appearance. She does not appear ill. No distress.  HENT:  Head: Normocephalic and atraumatic.  Right Ear: Hearing, external ear and ear canal normal. A middle ear effusion is present.  Left Ear: Hearing, external ear and ear canal normal.  A middle ear effusion  is present.  Nose: Mucosal edema and rhinorrhea present. No nose lacerations, sinus tenderness, nasal deformity, septal deviation or nasal septal hematoma. No epistaxis.  No foreign bodies. Right sinus exhibits no maxillary sinus tenderness and no frontal sinus tenderness. Left sinus exhibits no maxillary sinus tenderness and no frontal sinus tenderness.  Mouth/Throat: Uvula is midline and mucous membranes are normal. Mucous membranes are not pale, not dry and not cyanotic. She does not have dentures. No oral lesions. No trismus in the jaw. Normal dentition. No dental abscesses, uvula swelling, lacerations or dental caries. Posterior oropharyngeal edema and posterior oropharyngeal erythema present. No oropharyngeal exudate or tonsillar abscesses.  Cobblestoning posterior pharynx; bilateral TMs with air fluid level; bilateral nasal turbinates edema/erythema/excoriation/yellow discharge  Eyes: EOM and lids are normal. Pupils are equal, round, and reactive to light. Right eye exhibits no chemosis, no discharge, no exudate and no hordeolum. No foreign body present in the right eye. Left eye exhibits no chemosis, no discharge, no exudate and no hordeolum. No foreign body present in the left eye. Right conjunctiva is injected. Right conjunctiva has no hemorrhage. Left conjunctiva is injected. Left conjunctiva has no hemorrhage. No scleral icterus. Right eye exhibits normal extraocular motion and no nystagmus. Left eye exhibits normal extraocular motion and no nystagmus. Right pupil is round and reactive. Left pupil is round and reactive. Pupils are equal.    Bilateral eyelid conjunctiva at edges injected 2+; 1+ bulbar and other portions of eyelids upper and lower no discharge noted in clinic  Neck: Trachea normal and normal range of motion. Neck supple. No tracheal tenderness, no spinous process tenderness and no muscular tenderness present. No rigidity. No tracheal deviation, no edema, no  erythema and normal range of motion present. No thyroid mass and no thyromegaly present.  Cardiovascular: Normal rate, regular rhythm, S1 normal, S2 normal, normal heart sounds and intact distal pulses.  PMI is not displaced.  Exam reveals no gallop and no friction rub.   No murmur heard. Pulmonary/Chest: Effort normal and breath sounds normal. No accessory muscle usage or stridor. No respiratory distress. She has no decreased breath sounds. She has no wheezes. She has no rhonchi. She has no rales. She exhibits no tenderness.  Abdominal: Soft. She exhibits no distension.  Musculoskeletal: Normal range of motion. She exhibits no edema or tenderness.       Right shoulder: Normal.       Left shoulder: Normal.       Right hip: Normal.       Left hip: Normal.       Right knee: Normal.       Left knee: Normal.       Cervical back: Normal.       Right hand: Normal.       Left hand: Normal.  Lymphadenopathy:       Head (right side): No submental, no submandibular, no tonsillar, no preauricular, no posterior auricular and no occipital adenopathy present.       Head (left side): No submental, no submandibular, no tonsillar, no preauricular, no posterior auricular and no occipital adenopathy present.    She has no cervical adenopathy.       Right cervical: No superficial cervical, no deep cervical and no posterior cervical adenopathy present.      Left cervical: No superficial cervical, no deep cervical and no posterior cervical adenopathy present.  Neurological: She is alert and oriented to person, place, and time. She has normal strength. She is not disoriented. She displays  no atrophy and no tremor. No cranial nerve deficit or sensory deficit. She exhibits normal muscle tone. She displays no seizure activity. Coordination and gait normal. GCS eye subscore is 4. GCS verbal subscore is 5. GCS motor subscore is 6.  Skin: Skin is warm, dry and intact. No abrasion, no bruising, no burn, no ecchymosis, no  laceration, no lesion, no petechiae and no rash noted. She is not diaphoretic. No cyanosis or erythema. No pallor. Nails show no clubbing.  Psychiatric: She has a normal mood and affect. Her speech is normal and behavior is normal. Judgment and thought content normal. Cognition and memory are normal.  Nursing note and vitals reviewed.         Assessment & Plan:  A-bilateral conjunctivitis, otitis media with effusion, seasonal allergic rhinitis P- restart ciprofloxacin otic 0.3% 2 gtts ou q4h x 7 days Rx 12ml.  Refresh drops 2 ou TID. Work excuse given to patient and follow up appt scheduled 17 Feb 2016 at 1300 for re-evaluation and clearance to return to work.  Reiterated do not touch face.  Clean surfaces in home as grandchildren and she probably touched and can reinfect self--lysol/bleach.  Patient to apply warm packs prn as directed.  Instructed patient to not rub eyes.  May need to wash pillowcases more frequently until infection resolves.  May use over the counter eye drops/tears for pain/symptom relief.  Return to clinic if headache, fever greater than 100.64F, nausea/vomiting, purulent discharge/matting unable to open eye without using fingers after 24 hours of medication use, foreign body sensation, ciliary flush, worsening photophobia or vision.  Call or return to clinic as needed if these symptoms worsen or fail to improve as anticipated. Patient verbalized agreement and understanding of treatment plan.   P2:  Hand washing  Supportive treatment.   No evidence of invasive bacterial infection, non toxic and well hydrated.  This is most likely self limiting viral infection.  I do not see where any further testing or imaging is necessary at this time.   I will suggest supportive care, rest, good hygiene and encourage the patient to take adequate fluids.  The patient is to return to clinic or EMERGENCY ROOM if symptoms worsen or change significantly e.g. ear pain, fever, purulent discharge from ears  or bleeding.  Patient verbalized agreement and understanding of treatment plan.   Refilled singulair 10mg  po daily and claritin 10mg  po daily bridge until seen by PCM later this month.  Patient may use normal saline nasal spray as needed.  Consider nasal steroid use if no relief with singulair and claritin.  Avoid triggers if possible.  Shower prior to bedtime if exposed to triggers.  If allergic dust/dust mites recommend mattress/pillow covers/encasements; washing linens, vacuuming, sweeping, dusting weekly.  Call or return to clinic as needed if these symptoms worsen or fail to improve as anticipated. Patient verbalized understanding of instructions, agreed with plan of care and had no further questions at this time.  P2:  Avoidance and hand washing.

## 2016-02-17 ENCOUNTER — Encounter: Payer: Self-pay | Admitting: Physician Assistant

## 2016-02-17 ENCOUNTER — Ambulatory Visit: Payer: Self-pay | Admitting: Physician Assistant

## 2016-02-17 DIAGNOSIS — J209 Acute bronchitis, unspecified: Secondary | ICD-10-CM

## 2016-02-17 MED ORDER — IPRATROPIUM-ALBUTEROL 0.5-2.5 (3) MG/3ML IN SOLN
3.0000 mL | Freq: Once | RESPIRATORY_TRACT | Status: AC
  Administered 2016-02-17: 3 mL via RESPIRATORY_TRACT

## 2016-02-18 NOTE — Progress Notes (Signed)
See note in paper chart by Heather Ratcliffe, PAC  

## 2016-02-19 ENCOUNTER — Encounter: Payer: Self-pay | Admitting: Physician Assistant

## 2016-02-19 ENCOUNTER — Ambulatory Visit: Payer: Self-pay | Admitting: Physician Assistant

## 2016-02-19 VITALS — BP 140/90 | HR 76 | Temp 97.6°F

## 2016-02-19 DIAGNOSIS — J069 Acute upper respiratory infection, unspecified: Secondary | ICD-10-CM

## 2016-02-20 NOTE — Progress Notes (Signed)
Seen by Stacy Gardner, see scanned notes

## 2016-02-24 ENCOUNTER — Encounter: Payer: Self-pay | Admitting: Physician Assistant

## 2016-02-24 ENCOUNTER — Ambulatory Visit: Payer: Self-pay | Admitting: Physician Assistant

## 2016-02-24 VITALS — BP 179/90 | HR 80 | Temp 98.4°F

## 2016-02-24 DIAGNOSIS — J209 Acute bronchitis, unspecified: Secondary | ICD-10-CM

## 2016-02-24 NOTE — Progress Notes (Signed)
S: here for recheck, states she is feeling much better, is still taking levaquin, cough is still there but better, denies cp/sob  O: vitals wnl, nad, lungs c t a, cv rrr, cough is harsh and congested  A: acute bronchitis  P: since patient is afebrile and improved she may return to work

## 2016-02-25 ENCOUNTER — Emergency Department: Payer: 59

## 2016-02-25 ENCOUNTER — Encounter: Payer: Self-pay | Admitting: Emergency Medicine

## 2016-02-25 ENCOUNTER — Emergency Department
Admission: EM | Admit: 2016-02-25 | Discharge: 2016-02-25 | Disposition: A | Discharge status: Home / Self Care | Payer: 59 | Attending: Emergency Medicine | Admitting: Emergency Medicine

## 2016-02-25 DIAGNOSIS — E860 Dehydration: Secondary | ICD-10-CM | POA: Diagnosis not present

## 2016-02-25 DIAGNOSIS — Z7982 Long term (current) use of aspirin: Secondary | ICD-10-CM | POA: Insufficient documentation

## 2016-02-25 DIAGNOSIS — Z792 Long term (current) use of antibiotics: Secondary | ICD-10-CM | POA: Diagnosis not present

## 2016-02-25 DIAGNOSIS — Z7984 Long term (current) use of oral hypoglycemic drugs: Secondary | ICD-10-CM | POA: Insufficient documentation

## 2016-02-25 DIAGNOSIS — Z79899 Other long term (current) drug therapy: Secondary | ICD-10-CM | POA: Insufficient documentation

## 2016-02-25 DIAGNOSIS — N3090 Cystitis, unspecified without hematuria: Secondary | ICD-10-CM | POA: Insufficient documentation

## 2016-02-25 DIAGNOSIS — Z880 Allergy status to penicillin: Secondary | ICD-10-CM | POA: Insufficient documentation

## 2016-02-25 DIAGNOSIS — R42 Dizziness and giddiness: Secondary | ICD-10-CM | POA: Diagnosis present

## 2016-02-25 DIAGNOSIS — I10 Essential (primary) hypertension: Secondary | ICD-10-CM | POA: Insufficient documentation

## 2016-02-25 DIAGNOSIS — E119 Type 2 diabetes mellitus without complications: Secondary | ICD-10-CM | POA: Insufficient documentation

## 2016-02-25 DIAGNOSIS — Z87891 Personal history of nicotine dependence: Secondary | ICD-10-CM | POA: Diagnosis not present

## 2016-02-25 DIAGNOSIS — R05 Cough: Secondary | ICD-10-CM | POA: Insufficient documentation

## 2016-02-25 DIAGNOSIS — R0602 Shortness of breath: Secondary | ICD-10-CM | POA: Diagnosis not present

## 2016-02-25 LAB — URINALYSIS COMPLETE WITH MICROSCOPIC (ARMC ONLY)
Bacteria, UA: NONE SEEN
Bilirubin Urine: NEGATIVE
Glucose, UA: >500 mg/dL — AB
Ketones, ur: NEGATIVE mg/dL
Nitrite: NEGATIVE
Protein, ur: 100 mg/dL — AB
Specific Gravity, Urine: 1.02 (ref 1.005–1.030)
pH: 6 (ref 5.0–8.0)

## 2016-02-25 LAB — BASIC METABOLIC PANEL
Anion gap: 10 (ref 5–15)
BUN: 19 mg/dL (ref 6–20)
CO2: 29 mmol/L (ref 22–32)
Calcium: 9.7 mg/dL (ref 8.9–10.3)
Chloride: 91 mmol/L — ABNORMAL LOW (ref 101–111)
Creatinine, Ser: 1.12 mg/dL — ABNORMAL HIGH (ref 0.44–1.00)
GFR calc Af Amer: >60 mL/min (ref >60)
GFR calc non Af Amer: 52 mL/min — ABNORMAL LOW (ref >60)
Glucose, Bld: 317 mg/dL — ABNORMAL HIGH (ref 65–99)
Potassium: 4.2 mmol/L (ref 3.5–5.1)
Sodium: 130 mmol/L — ABNORMAL LOW (ref 135–145)

## 2016-02-25 LAB — CBC
HCT: 44.2 % (ref 35.0–47.0)
Hemoglobin: 15 g/dL (ref 12.0–16.0)
MCH: 27.9 pg (ref 26.0–34.0)
MCHC: 34 g/dL (ref 32.0–36.0)
MCV: 82 fL (ref 80.0–100.0)
Platelets: 298 10*3/uL (ref 150–440)
RBC: 5.39 MIL/uL — ABNORMAL HIGH (ref 3.80–5.20)
RDW: 12.9 % (ref 11.5–14.5)
WBC: 11.1 10*3/uL — ABNORMAL HIGH (ref 3.6–11.0)

## 2016-02-25 LAB — TROPONIN I: Troponin I: <0.03 ng/mL (ref <0.031)

## 2016-02-25 MED ORDER — CEFPODOXIME PROXETIL 100 MG PO TABS: 100.0000 mg | ORAL_TABLET | Freq: Two times a day (BID) | ORAL | Status: DC

## 2016-02-25 MED ORDER — SODIUM CHLORIDE 0.9 % IV BOLUS (SEPSIS)
1000.0000 mL | Freq: Once | INTRAVENOUS | Status: AC
  Administered 2016-02-25: 1000 mL via INTRAVENOUS

## 2016-02-25 MED ORDER — ONDANSETRON 4 MG PO TBDP: 4.0000 mg | ORAL_TABLET | Freq: Three times a day (TID) | ORAL | Status: DC | PRN

## 2016-02-25 MED ORDER — DEXTROSE 5 % IV SOLN
1.0000 g | Freq: Once | INTRAVENOUS | Status: AC
  Administered 2016-02-25: 1 g via INTRAVENOUS
  Filled 2016-02-25 (×2): qty 10

## 2016-02-25 NOTE — Discharge Instructions (Signed)
Dehydration, Adult Dehydration is a condition in which you do not have enough fluid or water in your body. It happens when you take in less fluid than you lose. Vital organs such as the kidneys, brain, and heart cannot function without a proper amount of fluids. Any loss of fluids from the body can cause dehydration.  Dehydration can range from mild to severe. This condition should be treated right away to help prevent it from becoming severe. CAUSES  This condition may be caused by:  Vomiting.  Diarrhea.  Excessive sweating, such as when exercising in hot or humid weather.  Not drinking enough fluid during strenuous exercise or during an illness.  Excessive urine output.  Fever.  Certain medicines. RISK FACTORS This condition is more likely to develop in:  People who are taking certain medicines that cause the body to lose excess fluid (diuretics).   People who have a chronic illness, such as diabetes, that may increase urination.  Older adults.   People who live at high altitudes.   People who participate in endurance sports.  SYMPTOMS  Mild Dehydration  Thirst.  Dry lips.  Slightly dry mouth.  Dry, warm skin. Moderate Dehydration  Very dry mouth.   Muscle cramps.   Dark urine and decreased urine production.   Decreased tear production.   Headache.   Light-headedness, especially when you stand up from a sitting position.  Severe Dehydration  Changes in skin.   Cold and clammy skin.   Skin does not spring back quickly when lightly pinched and released.   Changes in body fluids.   Extreme thirst.   No tears.   Not able to sweat when body temperature is high, such as in hot weather.   Minimal urine production.   Changes in vital signs.   Rapid, weak pulse (more than 100 beats per minute when you are sitting still).   Rapid breathing.   Low blood pressure.   Other changes.   Sunken eyes.   Cold hands and feet.    Confusion.  Lethargy and difficulty being awakened.  Fainting (syncope).   Short-term weight loss.   Unconsciousness. DIAGNOSIS  This condition may be diagnosed based on your symptoms. You may also have tests to determine how severe your dehydration is. These tests may include:   Urine tests.   Blood tests.  TREATMENT  Treatment for this condition depends on the severity. Mild or moderate dehydration can often be treated at home. Treatment should be started right away. Do not wait until dehydration becomes severe. Severe dehydration needs to be treated at the hospital. Treatment for Mild Dehydration  Drinking plenty of water to replace the fluid you have lost.   Replacing minerals in your blood (electrolytes) that you may have lost.  Treatment for Moderate Dehydration  Consuming oral rehydration solution (ORS). Treatment for Severe Dehydration  Receiving fluid through an IV tube.   Receiving electrolyte solution through a feeding tube that is passed through your nose and into your stomach (nasogastric tube or NG tube).  Correcting any abnormalities in electrolytes. HOME CARE INSTRUCTIONS   Drink enough fluid to keep your urine clear or pale yellow.   Drink water or fluid slowly by taking small sips. You can also try sucking on ice cubes.  Have food or beverages that contain electrolytes. Examples include bananas and sports drinks.  Take over-the-counter and prescription medicines only as told by your health care provider.   Prepare ORS according to the manufacturer's instructions. Take sips  of ORS every 5 minutes until your urine returns to normal.  If you have vomiting or diarrhea, continue to try to drink water, ORS, or both.   If you have diarrhea, avoid:   Beverages that contain caffeine.   Fruit juice.   Milk.   Carbonated soft drinks.  Do not take salt tablets. This can lead to the condition of having too much sodium in your body  (hypernatremia).  SEEK MEDICAL CARE IF:  You cannot eat or drink without vomiting.  You have had moderate diarrhea during a period of more than 24 hours.  You have a fever. SEEK IMMEDIATE MEDICAL CARE IF:   You have extreme thirst.  You have severe diarrhea.  You have not urinated in 6-8 hours, or you have urinated only a small amount of very dark urine.  You have shriveled skin.  You are dizzy, confused, or both.   This information is not intended to replace advice given to you by your health care provider. Make sure you discuss any questions you have with your health care provider.   Document Released: 11/30/2005 Document Revised: 08/21/2015 Document Reviewed: 04/17/2015 Elsevier Interactive Patient Education 2016 [place].  Hyponatremia Hyponatremia is when the amount of salt (sodium) in your blood is too low. When sodium levels are low, your cells absorb extra water and they swell. The swelling happens throughout the body, but it mostly affects the brain. CAUSES This condition may be caused by:  Heart, kidney, or liver problems.  Thyroid problems.  Adrenal gland problems.  Metabolic conditions, such as syndrome of inappropriate antidiuretic hormone (SIADH).  Severe vomiting and diarrhea.  Certain medicines or illegal drugs.  Dehydration.  Drinking too much water.  Eating a diet that is low in sodium.  Large burns on your body.  Sweating. RISK FACTORS This condition is more likely to develop in people who:  Have long-term (chronic) kidney disease.  Have heart failure.  Have a medical condition that causes frequent or excessive diarrhea.  Have metabolic conditions, such as Addison disease or SIADH.  Take certain medicines that affect the sodium and fluid balance in the blood. Some of these medicine types include:  Diuretics.  NSAIDs.  Some opioid pain medicines.  Some antidepressants.  Some seizure prevention medicines. SYMPTOMS    Symptoms of this condition include:  Nausea and vomiting.  Confusion.  Lethargy.  Agitation.  Headache.  Seizures.  Unconsciousness.  Appetite loss.  Muscle weakness and cramping.  Feeling weak or light-headed.  Having a rapid heart rate.  Fainting, in severe cases. DIAGNOSIS This condition is diagnosed with a medical history and physical exam. You will also have other tests, including:  Blood tests.  Urine tests. TREATMENT Treatment for this condition depends on the cause. Treatment may include:  Fluids given through an IV tube that is inserted into one of your veins.  Medicines to correct the sodium imbalance. If medicines are causing the condition, the medicines will need to be adjusted.  Limiting water or fluid intake to get the correct sodium balance. HOME CARE INSTRUCTIONS  Take medicines only as directed by your health care provider. Many medicines can make this condition worse. Talk with your health care provider about any medicines that you are currently taking.  Carefully follow a recommended diet as directed by your health care provider.  Carefully follow instructions from your health care provider about fluid restrictions.  Keep all follow-up visits as directed by your health care provider. This is important.  Do  not drink alcohol. SEEK MEDICAL CARE IF:  You develop worsening nausea, fatigue, headache, confusion, or weakness.  Your symptoms go away and then return.  You have problems following the recommended diet. SEEK IMMEDIATE MEDICAL CARE IF:  You have a seizure.  You faint.  You have ongoing diarrhea or vomiting.   This information is not intended to replace advice given to you by your health care provider. Make sure you discuss any questions you have with your health care provider.   Document Released: 11/20/2002 Document Revised: 04/16/2015 Document Reviewed: 12/20/2014 Elsevier Interactive Patient Education 2016 [employer]  Urinary Tract Infection Urinary tract infections (UTIs) can develop anywhere along your urinary tract. Your urinary tract is your body's drainage system for removing wastes and extra water. Your urinary tract includes two kidneys, two ureters, a bladder, and a urethra. Your kidneys are a pair of bean-shaped organs. Each kidney is about the size of your fist. They are located below your ribs, one on each side of your spine. CAUSES Infections are caused by microbes, which are microscopic organisms, including fungi, viruses, and bacteria. These organisms are so small that they can only be seen through a microscope. Bacteria are the microbes that most commonly cause UTIs. SYMPTOMS  Symptoms of UTIs may vary by age and gender of the patient and by the location of the infection. Symptoms in young women typically include a frequent and intense urge to urinate and a painful, burning feeling in the bladder or urethra during urination. Older women and men are more likely to be tired, shaky, and weak and have muscle aches and abdominal pain. A fever may mean the infection is in your kidneys. Other symptoms of a kidney infection include pain in your back or sides below the ribs, nausea, and vomiting. DIAGNOSIS To diagnose a UTI, your caregiver will ask you about your symptoms. Your caregiver will also ask you to provide a urine sample. The urine sample will be tested for bacteria and white blood cells. White blood cells are made by your body to help fight infection. TREATMENT  Typically, UTIs can be treated with medication. Because most UTIs are caused by a bacterial infection, they usually can be treated with the use of antibiotics. The choice of antibiotic and length of treatment depend on your symptoms and the type of bacteria causing your infection. HOME CARE INSTRUCTIONS  If you were prescribed antibiotics, take them exactly as your caregiver instructs you. Finish the medication even if you feel better  after you have only taken some of the medication.  Drink enough water and fluids to keep your urine clear or pale yellow.  Avoid caffeine, tea, and carbonated beverages. They tend to irritate your bladder.  Empty your bladder often. Avoid holding urine for long periods of time.  Empty your bladder before and after sexual intercourse.  After a bowel movement, women should cleanse from front to back. Use each tissue only once. SEEK MEDICAL CARE IF:   You have back pain.  You develop a fever.  Your symptoms do not begin to resolve within 3 days. SEEK IMMEDIATE MEDICAL CARE IF:   You have severe back pain or lower abdominal pain.  You develop chills.  You have nausea or vomiting.  You have continued burning or discomfort with urination. MAKE SURE YOU:   Understand these instructions.  Will watch your condition.  Will get help right away if you are not doing well or get worse.   This information is not  intended to replace advice given to you by your health care provider. Make sure you discuss any questions you have with your health care provider.   Document Released: 09/09/2005 Document Revised: 08/21/2015 Document Reviewed: 01/08/2012 Elsevier Interactive Patient Education [employer].

## 2016-02-25 NOTE — ED Provider Notes (Signed)
[facility] Emergency Department Provider Note  ____________________________________________  Time seen: 7:30 PM  I have reviewed the triage vital signs and the nursing notes.   HISTORY  Chief Complaint Dizziness    HPI [name] is a 60 y.o. female who complains of dizziness and lightheadedness today. She's also had generalized weakness and fatigue. She's had multiple illnesses recently including urinary tract infection and bronchitis. Treated with multiple antibiotics including azithromycin and Cipro and levofloxacin. No vomiting or diarrhea. No chest pain. Has some coughing still but not overtly short of breath. Has been feeling better and went to work today. Has had poor oral intake secondary to her ongoing acute illness.     Past Medical History  Diagnosis Date  . Hyperlipidemia   . Hypertension   . Depression   . Joint pain     as reported by patient  . Urinary incontinence     as stated by patient  . Cataract     left  . Anxiety      Patient Active Problem List   Diagnosis Date Noted  . Allergic rhinitis 10/28/2015  . Acute stress disorder 08/01/2015  . Anxiety 08/01/2015  . Clinical depression 08/01/2015  . Diabetes ([place]) 08/01/2015  . Diverticulosis of colon 08/01/2015  . Generalized pruritus 08/01/2015  . BP (high blood pressure) 08/01/2015  . Cannot sleep 08/01/2015  . Adiposity 08/01/2015  . Detrusor muscle hypertonia 08/01/2015  . Hypercholesterolemia without hypertriglyceridemia 08/01/2015  . Female stress incontinence 08/01/2015  . Avitaminosis D 08/01/2015     Past Surgical History  Procedure Laterality Date  . Breast lumpectomy Right 2005    as reported by patient  . Cataract extraction  January 2013  . Breast excisional biopsy Right 1999     Current Outpatient Rx  Name  Route  Sig  Dispense  Refill  . ALPRAZolam (XANAX) 0.5 MG tablet      TAKE ONE TABLET BY MOUTH EVERY 8 HOURS (1/2 TO 1 TABLET BEFORE  WORK AND EVERY 8 HOURS AS NEEDED)   30 tablet   1   . aspirin 81 MG tablet   Oral   Take 81 mg by mouth daily.         . benzonatate (TESSALON) 200 MG capsule   Oral   Take 1 capsule (200 mg total) by mouth 3 (three) times daily as needed for cough.   30 capsule   1   . CALCIUM CARBONATE-VIT D-MIN PO   Oral   Take 1 tablet by mouth daily. Reported on 02/11/2016         . cefpodoxime (VANTIN) 100 MG tablet   Oral   Take 1 tablet (100 mg total) by mouth 2 (two) times daily.   14 tablet   0   . Cholecalciferol (VITAMIN D) 2000 UNITS CAPS   Oral   Take 4 capsules by mouth daily. Reported on 02/11/2016         . ciprofloxacin (CIPRO) 250 MG tablet   Oral   Take 1 tablet (250 mg total) by mouth 2 (two) times daily.   14 tablet   0   . glucose blood test strip      Check blood sugar 4 times daily.   100 each   12   . hydrochlorothiazide (HYDRODIURIL) 25 MG tablet   Oral   Take 1 tablet (25 mg total) by mouth daily.   30 tablet   6     Patient needs to  schedule an appointment for any f ...   . hydrOXYzine (ATARAX/VISTARIL) 25 MG tablet   Oral   Take 1 tablet by mouth 3 (three) times daily as needed. Reported on 02/04/2016         . ibuprofen (ADVIL,MOTRIN) 800 MG tablet   Oral   Take 1 tablet (800 mg total) by mouth every 8 (eight) hours as needed for moderate pain.   15 tablet   0   . loratadine (CLARITIN) 10 MG tablet   Oral   Take 1 tablet (10 mg total) by mouth daily.   30 tablet   0   . losartan (COZAAR) 25 MG tablet   Oral   Take 1 tablet (25 mg total) by mouth 2 (two) times daily.   60 tablet   6   . metFORMIN (GLUCOPHAGE) 1000 MG tablet   Oral   Take 1 tablet (1,000 mg total) by mouth 2 (two) times daily with a meal.   60 tablet   6   . montelukast (SINGULAIR) 10 MG tablet   Oral   Take 1 tablet (10 mg total) by mouth daily.   30 tablet   0   . naproxen sodium (ANAPROX) 220 MG tablet   Oral   Take 220 mg by mouth as needed.          . ondansetron (ZOFRAN ODT) 4 MG disintegrating tablet   Oral   Take 1 tablet (4 mg total) by mouth every 8 (eight) hours as needed for nausea or vomiting.   20 tablet   0   . phenazopyridine (PYRIDIUM) 200 MG tablet   Oral   Take 1 tablet (200 mg total) by mouth 3 (three) times daily.   6 tablet   0   . pravastatin (PRAVACHOL) 40 MG tablet   Oral   Take 1 tablet (40 mg total) by mouth daily.   30 tablet   11   . psyllium (REGULOID) 0.52 G capsule   Oral   Take 1 capsule by mouth as directed. Reported on 02/11/2016         . sertraline (ZOLOFT) 100 MG tablet   Oral   Take 1 tablet (100 mg total) by mouth daily.   30 tablet   11   . tolterodine (DETROL LA) 2 MG 24 hr capsule   Oral   Take 1 capsule by mouth daily. Reported on 02/11/2016         . TRUEPLUS LANCETS 26G MISC   Does not apply   1 Device by Does not apply route 4 (four) times daily -  with meals and at bedtime.   100 each   12      Allergies Penicillins   Family History  Problem Relation Age of Onset  . Healthy Brother   . Aneurysm Mother     brain  . Diabetes Father   . CAD Father   . Heart failure Father   . Colon cancer Father   . Cancer Father   . Alzheimer's disease Paternal Grandmother   . Dementia Paternal Grandmother   . Mental illness Paternal Grandfather     Social History Social History  Substance Use Topics  . Smoking status: Former Smoker -- 1.50 packs/day for 25 years    Types: Cigarettes  . Smokeless tobacco: Never Used  . Alcohol Use: 0.0 oz/week    0 Standard drinks or equivalent per week     Comment: 1- every 3-4 months    Review  of Systems  Constitutional:   No fever or chills. No weight changes Eyes:   No blurry vision or double vision.  ENT:   No sore throat.  Cardiovascular:   No chest pain. Respiratory:   No dyspnea positive cough Gastrointestinal:   Negative for abdominal pain, vomiting and diarrhea.  No BRBPR or melena. Genitourinary:    Negative for dysuria or difficulty urinating. Musculoskeletal:   Negative for back pain. No joint swelling or pain. Skin:   Negative for rash. Neurological:   Negative for headaches, focal weakness or numbness. Psychiatric:  No anxiety or depression.   Endocrine:  Generalized weakness and fatigue.  10-point ROS otherwise negative.  ____________________________________________   PHYSICAL EXAM:  VITAL SIGNS: ED Triage Vitals  Enc Vitals Group     BP 02/25/16 1525 146/90 mmHg     Pulse Rate 02/25/16 1525 89     Resp 02/25/16 1525 18     Temp 02/25/16 1525 98.2 F (36.8 C)     Temp Source 02/25/16 1525 Oral     SpO2 02/25/16 1525 95 %     Weight 02/25/16 1525 202 lb (91.627 kg)     Height 02/25/16 1525 5\' 7"  (1.702 m)     Head Cir --      Peak Flow --      Pain Score 02/25/16 1526 2     Pain Loc --      Pain Edu? --      Excl. in [place]? --     Vital signs reviewed, nursing assessments reviewed.   Constitutional:   Alert and oriented. Well appearing and in no distress. Eyes:   No scleral icterus. No conjunctival pallor. PERRL. EOMI ENT   Head:   Normocephalic and atraumatic.   Nose:   No congestion/rhinnorhea. No septal hematoma   Mouth/Throat:   Dry mucous membranes, no pharyngeal erythema. No peritonsillar mass.    Neck:   No stridor. No SubQ emphysema. No meningismus. Hematological/Lymphatic/Immunilogical:   No cervical lymphadenopathy. Cardiovascular:   RRR. Symmetric bilateral radial and DP pulses.  No murmurs.  Respiratory:   Normal respiratory effort without tachypnea nor retractions. Breath sounds are clear and equal bilaterally. No wheezes/rales/rhonchi. Gastrointestinal:   Soft with suprapubic tenderness. Non distended. There is no CVA tenderness.  No rebound, rigidity, or guarding. Genitourinary:   deferred Musculoskeletal:   Nontender with normal range of motion in all extremities. No joint effusions.  No lower extremity tenderness.  No  edema. Neurologic:   Normal speech and language.  CN 2-10 normal. Motor grossly intact. No gross focal neurologic deficits are appreciated.  Skin:    Skin is warm, dry and intact. No rash noted.  No petechiae, purpura, or bullae. Poor skin turgor Psychiatric:   Mood and affect are normal. ____________________________________________    LABS (pertinent positives/negatives) (all labs ordered are listed, but only abnormal results are displayed) Labs Reviewed  BASIC METABOLIC PANEL - Abnormal; Notable for the following:    Sodium 130 (*)    Chloride 91 (*)    Glucose, Bld 317 (*)    Creatinine, Ser 1.12 (*)    GFR calc non Af Amer 52 (*)    All other components within normal limits  CBC - Abnormal; Notable for the following:    WBC 11.1 (*)    RBC 5.39 (*)    All other components within normal limits  URINALYSIS COMPLETEWITH MICROSCOPIC ([facility] ONLY) - Abnormal; Notable for the following:    Color, Urine AMBER (*)  APPearance CLOUDY (*)    Glucose, UA >500 (*)    Hgb urine dipstick 1+ (*)    Protein, ur 100 (*)    Leukocytes, UA 2+ (*)    Squamous Epithelial / LPF 6-30 (*)    All other components within normal limits  URINE CULTURE  TROPONIN I   ____________________________________________   EKG  Interpreted by me Normal sinus rhythm rate 95, left axis, normal intervals. Poor R-wave progression in anterior precordial leads. Normal ST segments and T waves.  ____________________________________________    RADIOLOGY  Chest x-ray unremarkable  ____________________________________________   PROCEDURES   ____________________________________________   INITIAL IMPRESSION / ASSESSMENT AND PLAN / ED COURSE  Pertinent labs & imaging results that were available during my care of the patient were reviewed by me and considered in my medical decision making (see chart for details).  Patient presents with general weakness and fatigue in the setting of ongoing recovery  from bronchitis and mucoid pneumonia treatment. This may in part be related to her fluoroquinolone use. However, she is found to be hyponatremic with a slightly elevated creatinine of 1.1. She has no CVA tenderness and is afebrile with otherwise unremarkable vital signs I don't think this is pyelonephritis. She does have a urinary tract infection. This appears to be cystitis likely arising from her diarrhea and contamination of her urinary tract and dehydration and mild hyponatremia due to her poor oral intake.  ----------------------------------------- 8:14 PM on 02/25/2016 -----------------------------------------  Patient received IV cephalexin in the ED. Her IV infiltrated after that after only about 500 cc of normal saline infused. Patient does not want another IV and instead wants to be go home immediately. She is eating and drinking well in the emergency department. I did counsel her extensively on the need to eat regularly and drink lots of fluids to maintain adequate rehydration and an electrolyte. We'll give her antiemetics, and continue antibiotics with Cefpodoxime. Follow-up closely with primary care. Highly doubt pyelonephritis or sepsis at this time. Patient is well-appearing and stable.     ____________________________________________   FINAL CLINICAL IMPRESSION(S) / ED DIAGNOSES  Final diagnoses:  Dehydration  Cystitis      Carrie Mew, MD 02/25/16 2015

## 2016-02-25 NOTE — ED Notes (Signed)
Patient to ER for c/o dizziness. Patient states she has seen MD this week d/t bronchitis. Has received three antibiotics in last three weeks d/t bronchitis, pink eye, and UTI. States she has mild shortness of breath, but is better now since diagnosis of bronchitis. Reports +cough. States MD gave her pills and liquid medication for cough. Denies any chest pain other than when coughing.

## 2016-02-25 NOTE — ED Notes (Signed)
Pt c/o dizziness and lightheadedness for today only - dx bronchitis for 2 weeks still on atb's - denies shortness of breath - pt reports productive cough (green and thick) - denies chest pain unless she coughs hard

## 2016-02-27 ENCOUNTER — Encounter: Payer: Self-pay | Admitting: Physician Assistant

## 2016-02-27 ENCOUNTER — Ambulatory Visit (INDEPENDENT_AMBULATORY_CARE_PROVIDER_SITE_OTHER): Payer: 59 | Admitting: Physician Assistant

## 2016-02-27 VITALS — BP 148/82 | HR 80 | Temp 98.1°F | Resp 16 | Wt 209.0 lb

## 2016-02-27 DIAGNOSIS — T3695XA Adverse effect of unspecified systemic antibiotic, initial encounter: Principal | ICD-10-CM

## 2016-02-27 DIAGNOSIS — B379 Candidiasis, unspecified: Secondary | ICD-10-CM

## 2016-02-27 LAB — URINE CULTURE

## 2016-02-27 MED ORDER — FLUCONAZOLE 150 MG PO TABS: ORAL_TABLET | ORAL | Status: DC

## 2016-02-27 NOTE — Progress Notes (Deleted)
Patient ID: [name], female   DOB: October 08, 1956, 60 y.o.   MRN: [id number]       Patient: [name] Female    DOB: 08/22/1956   60 y.o.   MRN: [id number] Visit Date: 02/27/2016  Today's Provider: Mar Daring, PA-C   Chief Complaint  Patient presents with  . Urinary Tract Infection   Subjective:    Urinary Tract Infection  This is a new problem. The quality of the pain is described as aching. There has been no fever. Associated symptoms include a discharge. Pertinent negatives include no hematuria or urgency.       Allergies  Allergen Reactions  . Penicillins    Previous Medications   ALPRAZOLAM (XANAX) 0.5 MG TABLET    TAKE ONE TABLET BY MOUTH EVERY 8 HOURS (1/2 TO 1 TABLET BEFORE WORK AND EVERY 8 HOURS AS NEEDED)   ASPIRIN 81 MG TABLET    Take 81 mg by mouth daily.   BENZONATATE (TESSALON) 200 MG CAPSULE    Take 1 capsule (200 mg total) by mouth 3 (three) times daily as needed for cough.   CALCIUM CARBONATE-VIT D-MIN PO    Take 1 tablet by mouth daily. Reported on 02/11/2016   CEFPODOXIME (VANTIN) 100 MG TABLET    Take 1 tablet (100 mg total) by mouth 2 (two) times daily.   CHOLECALCIFEROL (VITAMIN D) 2000 UNITS CAPS    Take 4 capsules by mouth daily. Reported on 02/11/2016   CIPROFLOXACIN (CIPRO) 250 MG TABLET    Take 1 tablet (250 mg total) by mouth 2 (two) times daily.   GLUCOSE BLOOD TEST STRIP    Check blood sugar 4 times daily.   HYDROCHLOROTHIAZIDE (HYDRODIURIL) 25 MG TABLET    Take 1 tablet (25 mg total) by mouth daily.   HYDROXYZINE (ATARAX/VISTARIL) 25 MG TABLET    Take 1 tablet by mouth 3 (three) times daily as needed. Reported on 02/04/2016   IBUPROFEN (ADVIL,MOTRIN) 800 MG TABLET    Take 1 tablet (800 mg total) by mouth every 8 (eight) hours as needed for moderate pain.   LORATADINE (CLARITIN) 10 MG TABLET    Take 1 tablet (10 mg total) by mouth daily.   LOSARTAN (COZAAR) 25 MG TABLET    Take 1 tablet (25 mg total) by mouth 2 (two) times daily.   METFORMIN (GLUCOPHAGE) 1000 MG TABLET    Take 1 tablet (1,000 mg total) by mouth 2 (two) times daily with a meal.   MONTELUKAST (SINGULAIR) 10 MG TABLET    Take 1 tablet (10 mg total) by mouth daily.   NAPROXEN SODIUM (ANAPROX) 220 MG TABLET    Take 220 mg by mouth as needed.   ONDANSETRON (ZOFRAN ODT) 4 MG DISINTEGRATING TABLET    Take 1 tablet (4 mg total) by mouth every 8 (eight) hours as needed for nausea or vomiting.   PHENAZOPYRIDINE (PYRIDIUM) 200 MG TABLET    Take 1 tablet (200 mg total) by mouth 3 (three) times daily.   PRAVASTATIN (PRAVACHOL) 40 MG TABLET    Take 1 tablet (40 mg total) by mouth daily.   PSYLLIUM (REGULOID) 0.52 G CAPSULE    Take 1 capsule by mouth as directed. Reported on 02/11/2016   SERTRALINE (ZOLOFT) 100 MG TABLET    Take 1 tablet (100 mg total) by mouth daily.   TOLTERODINE (DETROL LA) 2 MG 24 HR CAPSULE    Take 1 capsule by mouth daily. Reported on 02/11/2016   TRUEPLUS LANCETS 26G  MISC    1 Device by Does not apply route 4 (four) times daily -  with meals and at bedtime.    Review of Systems  Genitourinary: Negative for urgency and hematuria.    Social History  Substance Use Topics  . Smoking status: Former Smoker -- 1.50 packs/day for 25 years    Types: Cigarettes  . Smokeless tobacco: Never Used  . Alcohol Use: 0.0 oz/week    0 Standard drinks or equivalent per week     Comment: 1- every 3-4 months   Objective:   Wt 209 lb (94.802 kg)  LMP  (Approximate)  Physical Exam      Assessment & Plan:           Mar Daring, PA-C  [facility]

## 2016-02-27 NOTE — Progress Notes (Signed)
Patient ID: [name], female   DOB: 1956/05/28, 60 y.o.   MRN: [id number]       Patient: [name] Female    DOB: June 29, 1956   59 y.o.   MRN: [id number] Visit Date: 02/27/2016  Today's Provider: Mar Daring, PA-C   Chief Complaint  Patient presents with  . Vaginitis  . Dehydration   Subjective:    Vaginal Itching The patient's primary symptoms include genital itching, a genital rash and vaginal discharge. This is a new problem. The current episode started in the past 7 days. The problem occurs constantly. The problem has been gradually worsening. The pain is severe. The problem affects both sides. She is not pregnant. Pertinent negatives include no abdominal pain, nausea or vomiting. The vaginal discharge was white. There has been no bleeding. The symptoms are aggravated by urinating. She has tried nothing for the symptoms.  She has been on multiple antibiotics recently for UTI and URI symptoms.     Allergies  Allergen Reactions  . Penicillins    Previous Medications   ALPRAZOLAM (XANAX) 0.5 MG TABLET    TAKE ONE TABLET BY MOUTH EVERY 8 HOURS (1/2 TO 1 TABLET BEFORE WORK AND EVERY 8 HOURS AS NEEDED)   ASPIRIN 81 MG TABLET    Take 81 mg by mouth daily.   BENZONATATE (TESSALON) 200 MG CAPSULE    Take 1 capsule (200 mg total) by mouth 3 (three) times daily as needed for cough.   CALCIUM CARBONATE-VIT D-MIN PO    Take 1 tablet by mouth daily. Reported on 02/11/2016   CEFPODOXIME (VANTIN) 100 MG TABLET    Take 1 tablet (100 mg total) by mouth 2 (two) times daily.   CHOLECALCIFEROL (VITAMIN D) 2000 UNITS CAPS    Take 4 capsules by mouth daily. Reported on 02/11/2016   CIPROFLOXACIN (CIPRO) 250 MG TABLET    Take 1 tablet (250 mg total) by mouth 2 (two) times daily.   GLUCOSE BLOOD TEST STRIP    Check blood sugar 4 times daily.   HYDROCHLOROTHIAZIDE (HYDRODIURIL) 25 MG TABLET    Take 1 tablet (25 mg total) by mouth daily.   HYDROXYZINE (ATARAX/VISTARIL) 25 MG TABLET     Take 1 tablet by mouth 3 (three) times daily as needed. Reported on 02/04/2016   IBUPROFEN (ADVIL,MOTRIN) 800 MG TABLET    Take 1 tablet (800 mg total) by mouth every 8 (eight) hours as needed for moderate pain.   LORATADINE (CLARITIN) 10 MG TABLET    Take 1 tablet (10 mg total) by mouth daily.   LOSARTAN (COZAAR) 25 MG TABLET    Take 1 tablet (25 mg total) by mouth 2 (two) times daily.   METFORMIN (GLUCOPHAGE) 1000 MG TABLET    Take 1 tablet (1,000 mg total) by mouth 2 (two) times daily with a meal.   MONTELUKAST (SINGULAIR) 10 MG TABLET    Take 1 tablet (10 mg total) by mouth daily.   NAPROXEN SODIUM (ANAPROX) 220 MG TABLET    Take 220 mg by mouth as needed.   ONDANSETRON (ZOFRAN ODT) 4 MG DISINTEGRATING TABLET    Take 1 tablet (4 mg total) by mouth every 8 (eight) hours as needed for nausea or vomiting.   PHENAZOPYRIDINE (PYRIDIUM) 200 MG TABLET    Take 1 tablet (200 mg total) by mouth 3 (three) times daily.   PRAVASTATIN (PRAVACHOL) 40 MG TABLET    Take 1 tablet (40 mg total) by mouth daily.   PSYLLIUM (REGULOID)  0.52 G CAPSULE    Take 1 capsule by mouth as directed. Reported on 02/11/2016   SERTRALINE (ZOLOFT) 100 MG TABLET    Take 1 tablet (100 mg total) by mouth daily.   TOLTERODINE (DETROL LA) 2 MG 24 HR CAPSULE    Take 1 capsule by mouth daily. Reported on 02/11/2016   TRUEPLUS LANCETS 26G MISC    1 Device by Does not apply route 4 (four) times daily -  with meals and at bedtime.    Review of Systems  Constitutional: Negative.   Respiratory: Negative.   Cardiovascular: Negative.   Gastrointestinal: Negative for nausea, vomiting and abdominal pain.  Genitourinary: Positive for vaginal discharge and vaginal pain (burning and itching not pain).    Social History  Substance Use Topics  . Smoking status: Former Smoker -- 1.50 packs/day for 25 years    Types: Cigarettes  . Smokeless tobacco: Never Used  . Alcohol Use: 0.0 oz/week    0 Standard drinks or equivalent per week     Comment:  1- every 3-4 months   Objective:   BP 148/82 mmHg  Pulse 80  Temp(Src) 98.1 F (36.7 C)  Resp 16  Wt 209 lb (94.802 kg)  LMP  (Approximate)  Physical Exam  Constitutional: She appears well-developed and well-nourished. No distress.  Neck: Normal range of motion. Neck supple. No JVD present. No tracheal deviation present. No thyromegaly present.  Cardiovascular: Normal rate, regular rhythm and normal heart sounds.  Exam reveals no gallop and no friction rub.   No murmur heard. Pulmonary/Chest: Effort normal and breath sounds normal. No respiratory distress. She has no wheezes. She has no rales.  Abdominal: Soft. Normal appearance and bowel sounds are normal. There is no tenderness. There is no rigidity, no rebound, no guarding and no CVA tenderness.  Lymphadenopathy:    She has no cervical adenopathy.  Skin: She is not diaphoretic.  Vitals reviewed.       Assessment & Plan:     1. Antibiotic-induced yeast infection Will treat with diflucan as below. Will discontinue current antibiotic. She is to call the office if symptoms worsen or do not improve. She already has scheduled f/u on 03/16/16 for her DM.  - fluconazole (DIFLUCAN) 150 MG tablet; Take 1 tab PO, then take 2nd tab PO 72 hrs after 1st, then take 3rd 72 hrs after 2nd pill  Dispense: 3 tablet; Refill: 0       Mar Daring, PA-C  [facility]

## 2016-02-27 NOTE — Patient Instructions (Signed)

## 2016-03-04 ENCOUNTER — Other Ambulatory Visit: Payer: Self-pay

## 2016-03-04 ENCOUNTER — Ambulatory Visit: Payer: Self-pay

## 2016-03-04 NOTE — Patient Outreach (Signed)
[facility]) Care Management  03/04/2016  [name] 10-11-1956 [id number]   I called this patient last evening to remind her of our appointment today.  Patient stated she would not be attending because she has been out of work for a few days and cannot miss any more time from work.  She reports that she went to the emergency room last week for complaints of dizziness and then followed up with her MD a few days later.  She has not been checking blood sugars- I have asked her to check blood sugars 2x/day every day until she sees her MD 03/16/16.  She did tell me that her doctor has asked her to check her blood sugars as well.  Patient is to call me and update me after she sees her MD on 03/16/16.   I reminded [name] that her infection will not clear if her blood sugars remain elevated.  She acknowledged she was aware of this.    Gentry Fitz, RN, BA, [place], [place] Direct Dial:  [phone]  Fax:  [phone] E-mail: [name] [address] [place], [place]  [address]

## 2016-03-11 ENCOUNTER — Ambulatory Visit: Payer: Self-pay

## 2016-03-16 ENCOUNTER — Ambulatory Visit: Payer: 59 | Admitting: Physician Assistant

## 2016-03-26 ENCOUNTER — Ambulatory Visit: Payer: 59 | Admitting: Physician Assistant

## 2016-03-31 ENCOUNTER — Ambulatory Visit: Payer: 59 | Admitting: Physician Assistant

## 2016-03-31 ENCOUNTER — Encounter: Payer: Self-pay | Admitting: Physician Assistant

## 2016-03-31 ENCOUNTER — Ambulatory Visit (INDEPENDENT_AMBULATORY_CARE_PROVIDER_SITE_OTHER): Payer: 59 | Admitting: Physician Assistant

## 2016-03-31 VITALS — BP 140/80 | HR 72 | Temp 98.0°F | Resp 16 | Wt 210.0 lb

## 2016-03-31 DIAGNOSIS — N182 Chronic kidney disease, stage 2 (mild): Secondary | ICD-10-CM

## 2016-03-31 DIAGNOSIS — E1122 Type 2 diabetes mellitus with diabetic chronic kidney disease: Secondary | ICD-10-CM

## 2016-03-31 DIAGNOSIS — R739 Hyperglycemia, unspecified: Secondary | ICD-10-CM

## 2016-03-31 DIAGNOSIS — R7309 Other abnormal glucose: Secondary | ICD-10-CM

## 2016-03-31 LAB — POCT GLYCOSYLATED HEMOGLOBIN (HGB A1C)
Est. average glucose Bld gHb Est-mCnc: 240
Hemoglobin A1C: 10.3

## 2016-03-31 MED ORDER — EMPAGLIFLOZIN 10 MG PO TABS: 10.0000 mg | ORAL_TABLET | Freq: Every day | ORAL | Status: DC

## 2016-03-31 MED ORDER — EMPAGLIFLOZIN 25 MG PO TABS: 25.0000 mg | ORAL_TABLET | Freq: Every day | ORAL | Status: DC

## 2016-03-31 NOTE — Patient Instructions (Signed)
Empagliflozin oral tablets What is this medicine? EMPAGLIGLOZIN (EM pa gli FLOE zin) helps to treat type 2 diabetes. It helps to control blood sugar. Treatment is combined with diet and exercise. This medicine may be used for other purposes; ask your health care provider or pharmacist if you have questions. What should I tell my health care provider before I take this medicine? They need to know if you have any of these conditions: -dehydration -diabetic ketoacidosis -diet low in salt -eating less due to illness, surgery, dieting, or any other reason -having surgery -high cholesterol -high levels of potassium in the blood -history of pancreatitis or pancreas problems -history of yeast infection of the penis or vagina -if you often drink alcohol -infections in the bladder, kidneys, or urinary tract -kidney disease -liver disease -low blood pressure -on hemodialysis -problems urinating -type 1 diabetes -uncircumcised female -an unusual or allergic reaction to empagliflozin, other medicines, foods, dyes, or preservatives -pregnant or trying to get pregnant -breast-feeding How should I use this medicine? Take this medicine by mouth with a glass of water. Follow the directions on the prescription label. Take it in the morning, with or without food. Take your dose at the same time each day. Do not take more often than directed. Do not stop taking except on your doctor's advice. Talk to your pediatrician regarding the use of this medicine in children. Special care may be needed. Overdosage: If you think you have taken too much of this medicine contact a poison control center or emergency room at once. NOTE: This medicine is only for you. Do not share this medicine with others. What if I miss a dose? If you miss a dose, take it as soon as you can. If it is almost time for your next dose, take only that dose. Do not take double or extra doses. What may interact with this medicine? Do not take  this medicine with any of the following medications: -gatifloxacin This medicine may also interact with the following medications: -alcohol -certain medicines for blood pressure, heart disease -diuretics This list may not describe all possible interactions. Give your health care provider a list of all the medicines, herbs, non-prescription drugs, or dietary supplements you use. Also tell them if you smoke, drink alcohol, or use illegal drugs. Some items may interact with your medicine. What should I watch for while using this medicine? Visit your doctor or health care professional for regular checks on your progress. This medicine can cause a serious condition in which there is too much acid in the blood. If you develop nausea, vomiting, stomach pain, unusual tiredness, or breathing problems, stop taking this medicine and call your doctor right away. If possible, use a ketone dipstick to check for ketones in your urine. A test called the HbA1C (A1C) will be monitored. This is a simple blood test. It measures your blood sugar control over the last 2 to 3 months. You will receive this test every 3 to 6 months. Learn how to check your blood sugar. Learn the symptoms of low and high blood sugar and how to manage them. Always carry a quick-source of sugar with you in case you have symptoms of low blood sugar. Examples include hard sugar candy or glucose tablets. Make sure others know that you can choke if you eat or drink when you develop serious symptoms of low blood sugar, such as seizures or unconsciousness. They must get medical help at once. Tell your doctor or health care professional if you  have high blood sugar. You might need to change the dose of your medicine. If you are sick or exercising more than usual, you might need to change the dose of your medicine. Do not skip meals. Ask your doctor or health care professional if you should avoid alcohol. Many nonprescription cough and cold products  contain sugar or alcohol. These can affect blood sugar. Wear a medical ID bracelet or chain, and carry a card that describes your disease and details of your medicine and dosage times. What side effects may I notice from receiving this medicine? Side effects that you should report to your doctor or health care professional as soon as possible: -allergic reactions like skin rash, itching or hives, swelling of the face, lips, or tongue -breathing problems -dizziness -fast or irregular heartbeat -feeling faint or lightheaded, falls -muscle weakness -nausea, vomiting, unusual stomach upset or pain -signs and symptoms of low blood sugar such as feeling anxious, confusion, dizziness, increased hunger, unusually weak or tired, sweating, shakiness, cold, irritable, headache, blurred vision, fast heartbeat, loss of consciousness -signs and symptoms of a urinary tract infection, such as fever, chills, a burning feeling when urinating, blood in the urine, back pain -trouble passing urine or change in the amount of urine, including an urgent need to urinate more often, in larger amounts, or at night -penile discharge, itching, or pain in men -unusual tiredness -vaginal discharge, itching, or odor in women Side effects that usually do not require medical attention (Report these to your doctor or health care professional if they continue or are bothersome.): -joint pain -mild increase in urination -thirsty This list may not describe all possible side effects. Call your doctor for medical advice about side effects. You may report side effects to FDA at 1-800-FDA-1088. Where should I keep my medicine? Keep out of the reach of children. Store at room temperature between 20 and 25 degrees C (68 and 77 degrees F). Throw away any unused medicine after the expiration date. NOTE: This sheet is a summary. It may not cover all possible information. If you have questions about this medicine, talk to your doctor,  pharmacist, or health care provider.    2016, Elsevier/Gold Standard. (2014-11-20 11:37:10)

## 2016-03-31 NOTE — Progress Notes (Signed)
Patient: [name] Female    DOB: 1956/02/08   60 y.o.   MRN: [id number] Visit Date: 03/31/2016  Today's Provider: Mar Daring, PA-C   Chief Complaint  Patient presents with  . Follow-up    Elevated blood sugars.   Subjective:    HPI [name] is here for follow-up her T2DM. She reports that her blood sugar levels are better now at home with fasting levels in the low 100's. No frequent urination, chest pain, no leg swelling. She reports feeling dizzy occasionally (improving) and thirsty.  She was previously seen by the diabetic education center at [facility] but of recent has not been seen or would cancel her appointments. She states today that is because she had missed so many days of work from other illnesses, she had either been sick and couldn't make it or she could not miss any more work. She states she does not have a very healthy diet and snacks often. She is trying to cut back on sodas. She states she is going to drink Sprite zero while at work now instead of the orange soda or diet Mt. Dews she had been drinking. Most recent HgBA1c was 8.5 on 12/30/15. She is currently only on Metformin 1000mg  BID. She reports good compliance with medication.     Allergies  Allergen Reactions  . Penicillins    Previous Medications   ALPRAZOLAM (XANAX) 0.5 MG TABLET    TAKE ONE TABLET BY MOUTH EVERY 8 HOURS (1/2 TO 1 TABLET BEFORE WORK AND EVERY 8 HOURS AS NEEDED)   ASPIRIN 81 MG TABLET    Take 81 mg by mouth daily.   BENZONATATE (TESSALON) 200 MG CAPSULE    Take 1 capsule (200 mg total) by mouth 3 (three) times daily as needed for cough.   CEFPODOXIME (VANTIN) 100 MG TABLET    Take 1 tablet (100 mg total) by mouth 2 (two) times daily.   FLUCONAZOLE (DIFLUCAN) 150 MG TABLET    Take 1 tab PO, then take 2nd tab PO 72 hrs after 1st, then take 3rd 72 hrs after 2nd pill   GLUCOSE BLOOD TEST STRIP    Check blood sugar 4 times daily.   HYDROCHLOROTHIAZIDE (HYDRODIURIL) 25 MG TABLET     Take 1 tablet (25 mg total) by mouth daily.   HYDROXYZINE (ATARAX/VISTARIL) 25 MG TABLET    Take 1 tablet by mouth 3 (three) times daily as needed. Reported on 03/31/2016   IBUPROFEN (ADVIL,MOTRIN) 800 MG TABLET    Take 1 tablet (800 mg total) by mouth every 8 (eight) hours as needed for moderate pain.   LORATADINE (CLARITIN) 10 MG TABLET    Take 1 tablet (10 mg total) by mouth daily.   LOSARTAN (COZAAR) 25 MG TABLET    Take 1 tablet (25 mg total) by mouth 2 (two) times daily.   METFORMIN (GLUCOPHAGE) 1000 MG TABLET    Take 1 tablet (1,000 mg total) by mouth 2 (two) times daily with a meal.   MONTELUKAST (SINGULAIR) 10 MG TABLET    Take 1 tablet (10 mg total) by mouth daily.   MULTIPLE VITAMIN (MULTIVITAMIN) TABLET    Take 1 tablet by mouth daily.   NAPROXEN SODIUM (ANAPROX) 220 MG TABLET    Take 220 mg by mouth as needed.   ONDANSETRON (ZOFRAN ODT) 4 MG DISINTEGRATING TABLET    Take 1 tablet (4 mg total) by mouth every 8 (eight) hours as needed for nausea or vomiting.  PHENAZOPYRIDINE (PYRIDIUM) 200 MG TABLET    Take 1 tablet (200 mg total) by mouth 3 (three) times daily.   PRAVASTATIN (PRAVACHOL) 40 MG TABLET    Take 1 tablet (40 mg total) by mouth daily.   PSYLLIUM (REGULOID) 0.52 G CAPSULE    Take 1 capsule by mouth as directed. Reported on 03/31/2016   SERTRALINE (ZOLOFT) 100 MG TABLET    Take 1 tablet (100 mg total) by mouth daily.   TRUEPLUS LANCETS 26G MISC    1 Device by Does not apply route 4 (four) times daily -  with meals and at bedtime.    Review of Systems  Constitutional: Negative.   Respiratory: Negative.   Cardiovascular: Negative for chest pain, palpitations and leg swelling.  Gastrointestinal: Negative.   Endocrine: Positive for polydipsia.  Genitourinary: Negative.   Neurological: Positive for dizziness. Negative for headaches.    Social History  Substance Use Topics  . Smoking status: Former Smoker -- 1.50 packs/day for 25 years    Types: Cigarettes  . Smokeless  tobacco: Never Used  . Alcohol Use: 0.0 oz/week    0 Standard drinks or equivalent per week     Comment: 1- every 3-4 months   Objective:   BP 140/80 mmHg  Pulse 72  Temp(Src) 98 F (36.7 C) (Oral)  Resp 16  Wt 210 lb (95.255 kg)  LMP  (Approximate)  Physical Exam  Constitutional: She appears well-developed and well-nourished. No distress.  Cardiovascular: Normal rate, regular rhythm and normal heart sounds.  Exam reveals no gallop and no friction rub.   No murmur heard. Pulmonary/Chest: Effort normal and breath sounds normal. No respiratory distress. She has no wheezes. She has no rales.  Skin: She is not diaphoretic.  Vitals reviewed.       Assessment & Plan:     1. Type 2 diabetes mellitus with stage 2 chronic kidney disease, without long-term current use of insulin ([place]) HgBA1c today in the office was 10.3, a significant increase from January. I will add Jardiance 10mg  as below x 2 weeks (samples given) and then titrate up to Jardiance 25mg  (2 weeks of smaples given and Rx sent to pharmacy). She is to continue taking her metformin BID. She needs to keep a log of blood sugars at home and check at least once daily, if not twice daily. She needs to continue working on diet changes and trying to add physical activity. She is to call the office if she has any adverse reaction to the medication, acute issue, question or concern. If not I will see her back in 3 months for T2DM recheck.  She will need urine microalbumin done at that visit also. - empagliflozin (JARDIANCE) 10 MG TABS tablet; Take 10 mg by mouth daily.  Dispense: 7 tablet; Refill: 1 - empagliflozin (JARDIANCE) 25 MG TABS tablet; Take 25 mg by mouth daily.  Dispense: 90 tablet; Refill: 3  2. Elevated blood sugar See above medical treatment plan. - POCT glycosylated hemoglobin (Hb A1C)       Mar Daring, PA-C  [facility]

## 2016-04-08 ENCOUNTER — Other Ambulatory Visit: Payer: Self-pay

## 2016-04-08 NOTE — Patient Outreach (Signed)
[facility]) Care Management  04/08/2016  [name] September 22, 1956 [id number]  Spoke to [name] regarding her recent A1C. She reports that she has been taking her Jardience as ordered.  I have scheduled her to see me on May 31st and have asked her to bring her meter.   Gentry Fitz, RN, BA, [place], [place] Direct Dial:  [phone]  Fax:  [phone] E-mail: [email or web address] [address] [place], [place]  [address]

## 2016-04-23 ENCOUNTER — Telehealth: Payer: Self-pay | Admitting: Physician Assistant

## 2016-04-23 ENCOUNTER — Ambulatory Visit (INDEPENDENT_AMBULATORY_CARE_PROVIDER_SITE_OTHER): Payer: 59 | Admitting: Physician Assistant

## 2016-04-23 ENCOUNTER — Encounter: Payer: Self-pay | Admitting: Physician Assistant

## 2016-04-23 VITALS — BP 128/78 | HR 68 | Temp 98.2°F | Resp 14 | Wt 207.8 lb

## 2016-04-23 DIAGNOSIS — L298 Other pruritus: Secondary | ICD-10-CM | POA: Diagnosis not present

## 2016-04-23 DIAGNOSIS — B379 Candidiasis, unspecified: Secondary | ICD-10-CM

## 2016-04-23 DIAGNOSIS — N898 Other specified noninflammatory disorders of vagina: Secondary | ICD-10-CM

## 2016-04-23 LAB — POCT WET PREP (WET MOUNT)

## 2016-04-23 MED ORDER — FLUCONAZOLE 150 MG PO TABS: 150.0000 mg | ORAL_TABLET | Freq: Once | ORAL | Status: DC

## 2016-04-23 NOTE — Telephone Encounter (Signed)
Yes she needs to be seen to make sure not a yeast infection.

## 2016-04-23 NOTE — Telephone Encounter (Signed)
Patient will call back to scheduled appointment.  Thanks,  -Elcie Pelster

## 2016-04-23 NOTE — Progress Notes (Signed)
Patient ID: [name], female   DOB: 1955-12-26, 60 y.o.   MRN: [id number]   Patient: [name] Female    DOB: 1956-01-03   60 y.o.   MRN: [id number] Visit Date: 04/23/2016  Today's Provider: Mar Daring, PA-C   Chief Complaint  Patient presents with  . Vaginal Itching   Subjective:    Vaginal Itching The patient's primary symptoms include genital itching and vaginal discharge. The patient's pertinent negatives include no pelvic pain. This is a new problem. The current episode started 1 to 4 weeks ago. The problem occurs constantly. The problem has been gradually worsening. The patient is experiencing no pain. The problem affects both sides. She is not pregnant. Pertinent negatives include no dysuria, flank pain, frequency or urgency. She has tried nothing for the symptoms.      Previous Medications   ALPRAZOLAM (XANAX) 0.5 MG TABLET    TAKE ONE TABLET BY MOUTH EVERY 8 HOURS (1/2 TO 1 TABLET BEFORE WORK AND EVERY 8 HOURS AS NEEDED)   ASPIRIN 81 MG TABLET    Take 81 mg by mouth daily.   CEFPODOXIME (VANTIN) 100 MG TABLET    Take 1 tablet (100 mg total) by mouth 2 (two) times daily.   EMPAGLIFLOZIN (JARDIANCE) 10 MG TABS TABLET    Take 10 mg by mouth daily.   EMPAGLIFLOZIN (JARDIANCE) 25 MG TABS TABLET    Take 25 mg by mouth daily.   GLUCOSE BLOOD TEST STRIP    Check blood sugar 4 times daily.   HYDROCHLOROTHIAZIDE (HYDRODIURIL) 25 MG TABLET    Take 1 tablet (25 mg total) by mouth daily.   HYDROXYZINE (ATARAX/VISTARIL) 25 MG TABLET    Take 1 tablet by mouth 3 (three) times daily as needed. Reported on 03/31/2016   IBUPROFEN (ADVIL,MOTRIN) 800 MG TABLET    Take 1 tablet (800 mg total) by mouth every 8 (eight) hours as needed for moderate pain.   LORATADINE (CLARITIN) 10 MG TABLET    Take 1 tablet (10 mg total) by mouth daily.   LOSARTAN (COZAAR) 25 MG TABLET    Take 1 tablet (25 mg total) by mouth 2 (two) times daily.   METFORMIN (GLUCOPHAGE) 1000 MG TABLET    Take 1  tablet (1,000 mg total) by mouth 2 (two) times daily with a meal.   MONTELUKAST (SINGULAIR) 10 MG TABLET    Take 1 tablet (10 mg total) by mouth daily.   MULTIPLE VITAMIN (MULTIVITAMIN) TABLET    Take 1 tablet by mouth daily.   NAPROXEN SODIUM (ANAPROX) 220 MG TABLET    Take 220 mg by mouth as needed.   ONDANSETRON (ZOFRAN ODT) 4 MG DISINTEGRATING TABLET    Take 1 tablet (4 mg total) by mouth every 8 (eight) hours as needed for nausea or vomiting.   PHENAZOPYRIDINE (PYRIDIUM) 200 MG TABLET    Take 1 tablet (200 mg total) by mouth 3 (three) times daily.   PRAVASTATIN (PRAVACHOL) 40 MG TABLET    Take 1 tablet (40 mg total) by mouth daily.   PSYLLIUM (REGULOID) 0.52 G CAPSULE    Take 1 capsule by mouth as directed. Reported on 03/31/2016   SERTRALINE (ZOLOFT) 100 MG TABLET    Take 1 tablet (100 mg total) by mouth daily.   TRUEPLUS LANCETS 26G MISC    1 Device by Does not apply route 4 (four) times daily -  with meals and at bedtime.   Allergies  Allergen Reactions  . Penicillins  Review of Systems  Constitutional: Negative.   HENT: Negative.   Eyes: Negative.   Respiratory: Negative.   Cardiovascular: Negative.   Endocrine: Negative.   Genitourinary: Positive for vaginal discharge and vaginal pain (raw feeling and itchy). Negative for dysuria, urgency, frequency, flank pain, vaginal bleeding, genital sores, menstrual problem and pelvic pain.       Vaginal itching   Musculoskeletal: Negative.   Allergic/Immunologic: Negative.   Neurological: Negative.   Hematological: Negative.   Psychiatric/Behavioral: Negative.     Social History  Substance Use Topics  . Smoking status: Former Smoker -- 1.50 packs/day for 25 years    Types: Cigarettes  . Smokeless tobacco: Never Used  . Alcohol Use: 0.0 oz/week    0 Standard drinks or equivalent per week     Comment: 1- every 3-4 months   Objective:   BP 128/78 mmHg  Pulse 68  Temp(Src) 98.2 F (36.8 C) (Oral)  Resp 14  Wt 207 lb 12.8  oz (94.257 kg)  LMP  (Approximate)  Physical Exam  Constitutional: She appears well-developed and well-nourished. No distress.  Cardiovascular: Normal rate, regular rhythm and normal heart sounds.  Exam reveals no gallop and no friction rub.   No murmur heard. Pulmonary/Chest: Effort normal and breath sounds normal. No respiratory distress. She has no wheezes. She has no rales.  Genitourinary:    There is tenderness and lesion on the right labia. There is no rash or injury on the right labia. There is tenderness and lesion on the left labia. There is no rash or injury on the left labia. Cervix exhibits no motion tenderness, no discharge and no friability. There is erythema in the vagina. No tenderness or bleeding in the vagina. No foreign body around the vagina. No signs of injury around the vagina. Vaginal discharge found.  Skin: She is not diaphoretic.  Vitals reviewed.       Assessment & Plan:     1. Yeast infection Will treat with diflucan for yeast infection and advised to use desitin cream in the raw skin. If no improvement she is to call the office to be re-evaluated.  - fluconazole (DIFLUCAN) 150 MG tablet; Take 1 tablet (150 mg total) by mouth once. Take 2nd tab PO 48-72 hrs after 1st if needed.  Dispense: 2 tablet; Refill: 0  2. Vaginal itching Positive for yeast. - POCT [employer])   Follow up: No Follow-up on file.

## 2016-04-23 NOTE — Patient Instructions (Signed)

## 2016-04-23 NOTE — Telephone Encounter (Signed)
Pt was started on empagliflozin (JARDIANCE) 10 MG TABS tablet  She has been itching for a bout a week and thinks it's because of this new medication  Please advise [phone]  Lavonne Chick

## 2016-04-23 NOTE — Telephone Encounter (Signed)
Spoke with [name]. She reports that after starting the Jardiance she started having itching in vaginal area. No other symptom per patient. No discharge, no bad odor. She report that she started the Jardiance 2 weeks ago. Told patient that most like she will need to be seen. She wants to see what you think.  Please advise.  Thanks,  -Joseline

## 2016-05-06 ENCOUNTER — Ambulatory Visit (INDEPENDENT_AMBULATORY_CARE_PROVIDER_SITE_OTHER): Payer: 59 | Admitting: Physician Assistant

## 2016-05-06 ENCOUNTER — Encounter: Payer: Self-pay | Admitting: Physician Assistant

## 2016-05-06 VITALS — BP 120/70 | HR 70 | Temp 98.0°F | Resp 16 | Wt 208.4 lb

## 2016-05-06 DIAGNOSIS — E7800 Pure hypercholesterolemia, unspecified: Secondary | ICD-10-CM | POA: Diagnosis not present

## 2016-05-06 DIAGNOSIS — I10 Essential (primary) hypertension: Secondary | ICD-10-CM | POA: Diagnosis not present

## 2016-05-06 DIAGNOSIS — H6982 Other specified disorders of Eustachian tube, left ear: Secondary | ICD-10-CM | POA: Diagnosis not present

## 2016-05-06 DIAGNOSIS — J309 Allergic rhinitis, unspecified: Secondary | ICD-10-CM | POA: Diagnosis not present

## 2016-05-06 DIAGNOSIS — H6992 Unspecified Eustachian tube disorder, left ear: Secondary | ICD-10-CM

## 2016-05-06 DIAGNOSIS — H8110 Benign paroxysmal vertigo, unspecified ear: Secondary | ICD-10-CM

## 2016-05-06 DIAGNOSIS — B373 Candidiasis of vulva and vagina: Secondary | ICD-10-CM

## 2016-05-06 DIAGNOSIS — B3731 Acute candidiasis of vulva and vagina: Secondary | ICD-10-CM

## 2016-05-06 MED ORDER — PRAVASTATIN SODIUM 40 MG PO TABS: 40.0000 mg | ORAL_TABLET | Freq: Every day | ORAL | Status: DC

## 2016-05-06 MED ORDER — NYSTATIN 100000 UNIT/GM EX CREA: 1.0000 "application " | TOPICAL_CREAM | Freq: Two times a day (BID) | CUTANEOUS | Status: DC | PRN

## 2016-05-06 MED ORDER — FLUTICASONE PROPIONATE 50 MCG/ACT NA SUSP: 2.0000 | Freq: Every day | NASAL | Status: DC

## 2016-05-06 MED ORDER — HYDROCHLOROTHIAZIDE 25 MG PO TABS: 25.0000 mg | ORAL_TABLET | Freq: Every day | ORAL | Status: DC

## 2016-05-06 MED ORDER — LORATADINE 10 MG PO TABS: 10.0000 mg | ORAL_TABLET | Freq: Every day | ORAL | Status: DC

## 2016-05-06 NOTE — Patient Instructions (Signed)
Barotitis Media Barotitis media is inflammation of your middle ear. This occurs when the auditory tube (eustachian tube) leading from the back of your nose (nasopharynx) to your eardrum is blocked. This blockage may result from a cold, environmental allergies, or an upper respiratory infection. Unresolved barotitis media may lead to damage or hearing loss (barotrauma), which may become permanent. HOME CARE INSTRUCTIONS   Use medicines as recommended by your health care provider. Over-the-counter medicines will help unblock the canal and can help during times of air travel.  Do not put anything into your ears to clean or unplug them. Eardrops will not be helpful.  Do not swim, dive, or fly until your health care provider says it is all right to do so. If these activities are necessary, chewing gum with frequent, forceful swallowing may help. It is also helpful to hold your nose and gently blow to pop your ears for equalizing pressure changes. This forces air into the eustachian tube.  Only take over-the-counter or prescription medicines for pain, discomfort, or fever as directed by your health care provider.  A decongestant may be helpful in decongesting the middle ear and make pressure equalization easier. SEEK MEDICAL CARE IF:  You experience a serious form of dizziness in which you feel as if the room is spinning and you feel nauseated (vertigo).  Your symptoms only involve one ear. SEEK IMMEDIATE MEDICAL CARE IF:   You develop a severe headache, dizziness, or severe ear pain.  You have bloody or pus-like drainage from your ears.  You develop a fever.  Your problems do not improve or become worse. MAKE SURE YOU:   Understand these instructions.  Will watch your condition.  Will get help right away if you are not doing well or get worse.   This information is not intended to replace advice given to you by your health care provider. Make sure you discuss any questions you have with  your health care provider.   Document Released: 11/27/2000 Document Revised: 09/20/2013 Document Reviewed: 06/27/2013 Elsevier Interactive Patient Education 2016 Else[employer]. Benign Positional Vertigo Vertigo is the feeling that you or your surroundings are moving when they are not. Benign positional vertigo is the most common form of vertigo. The cause of this condition is not serious (is benign). This condition is triggered by certain movements and positions (is positional). This condition can be dangerous if it occurs while you are doing something that could endanger you or others, such as driving.  CAUSES In many cases, the cause of this condition is not known. It may be caused by a disturbance in an area of the inner ear that helps your brain to sense movement and balance. This disturbance can be caused by a viral infection (labyrinthitis), head injury, or repetitive motion. RISK FACTORS This condition is more likely to develop in:  Women.  People who are 99 years of age or older. SYMPTOMS Symptoms of this condition usually happen when you move your head or your eyes in different directions. Symptoms may start suddenly, and they usually last for less than a minute. Symptoms may include:  Loss of balance and falling.  Feeling like you are spinning or moving.  Feeling like your surroundings are spinning or moving.  Nausea and vomiting.  Blurred vision.  Dizziness.  Involuntary eye movement (nystagmus). Symptoms can be mild and cause only slight annoyance, or they can be severe and interfere with daily life. Episodes of benign positional vertigo may return (recur) over time, and they  may be triggered by certain movements. Symptoms may improve over time. DIAGNOSIS This condition is usually diagnosed by medical history and a physical exam of the head, neck, and ears. You may be referred to a health care provider who specializes in ear, nose, and throat (ENT) problems  (otolaryngologist) or a provider who specializes in disorders of the nervous system (neurologist). You may have additional testing, including:  MRI.  A CT scan.  Eye movement tests. Your health care provider may ask you to change positions quickly while he or she watches you for symptoms of benign positional vertigo, such as nystagmus. Eye movement may be tested with an electronystagmogram (ENG), caloric stimulation, the Dix-Hallpike test, or the roll test.  An electroencephalogram (EEG). This records electrical activity in your brain.  Hearing tests. TREATMENT Usually, your health care provider will treat this by moving your head in specific positions to adjust your inner ear back to normal. Surgery may be needed in severe cases, but this is rare. In some cases, benign positional vertigo may resolve on its own in 2-4 weeks. HOME CARE INSTRUCTIONS Safety  Move slowly.Avoid sudden body or head movements.  Avoid driving.  Avoid operating heavy machinery.  Avoid doing any tasks that would be dangerous to you or others if a vertigo episode would occur.  If you have trouble walking or keeping your balance, try using a cane for stability. If you feel dizzy or unstable, sit down right away.  Return to your normal activities as told by your health care provider. Ask your health care provider what activities are safe for you. General Instructions  Take over-the-counter and prescription medicines only as told by your health care provider.  Avoid certain positions or movements as told by your health care provider.  Drink enough fluid to keep your urine clear or pale yellow.  Keep all follow-up visits as told by your health care provider. This is important. SEEK MEDICAL CARE IF:  You have a fever.  Your condition gets worse or you develop new symptoms.  Your family or friends notice any behavioral changes.  Your nausea or vomiting gets worse.  You have numbness or a "pins and  needles" sensation. SEEK IMMEDIATE MEDICAL CARE IF:  You have difficulty speaking or moving.  You are always dizzy.  You faint.  You develop severe headaches.  You have weakness in your legs or arms.  You have changes in your hearing or vision.  You develop a stiff neck.  You develop sensitivity to light.   This information is not intended to replace advice given to you by your health care provider. Make sure you discuss any questions you have with your health care provider.   Document Released: 09/07/2006 Document Revised: 08/21/2015 Document Reviewed: 03/25/2015 Elsevier Interactive Patient Education [employer].

## 2016-05-06 NOTE — Progress Notes (Signed)
Patient: [name] Female    DOB: 12-Aug-1956   60 y.o.   MRN: [id number] Visit Date: 05/06/2016  Today's Provider: Mar Daring, PA-C   Chief Complaint  Patient presents with  . Dizziness   Subjective:    Dizziness This is a recurrent problem. The current episode started in the past 7 days (She has been having this episode off and on but since Saturday she being feeling dizzy.). The problem has been waxing and waning (It was very bad Saturday 5-6 hours, but not as bad as Jan/Feb). Associated symptoms include neck pain (hurts all the time) and vertigo. Pertinent negatives include no abdominal pain, arthralgias, change in bowel habit, chest pain, chills, congestion, coughing, diaphoresis, fatigue, fever, headaches, joint swelling, myalgias, nausea, numbness, rash, sore throat, swollen glands, urinary symptoms, visual change, vomiting or weakness. Associated symptoms comments: Patient reports in Jan/Feb 2017 she was feeling dizzy, unable to walk with face numbness on the left side and it only lasted for 10 minutes. Went to [facility] employee [facility] and diagnosed with vertigo. The symptoms are aggravated by bending and twisting. She has tried rest and sleep for the symptoms. The treatment provided no relief.      Allergies  Allergen Reactions  . Penicillins    Previous Medications   ALPRAZOLAM (XANAX) 0.5 MG TABLET    TAKE ONE TABLET BY MOUTH EVERY 8 HOURS (1/2 TO 1 TABLET BEFORE WORK AND EVERY 8 HOURS AS NEEDED)   ASPIRIN 81 MG TABLET    Take 81 mg by mouth daily.   CEFPODOXIME (VANTIN) 100 MG TABLET    Take 1 tablet (100 mg total) by mouth 2 (two) times daily.   EMPAGLIFLOZIN (JARDIANCE) 25 MG TABS TABLET    Take 25 mg by mouth daily.   FLUCONAZOLE (DIFLUCAN) 150 MG TABLET    Take 1 tablet (150 mg total) by mouth once. Take 2nd tab PO 48-72 hrs after 1st if needed.   GLUCOSE BLOOD TEST STRIP    Check blood sugar 4 times daily.   HYDROCHLOROTHIAZIDE (HYDRODIURIL) 25 MG  TABLET    Take 1 tablet (25 mg total) by mouth daily.   HYDROXYZINE (ATARAX/VISTARIL) 25 MG TABLET    Take 1 tablet by mouth 3 (three) times daily as needed. Reported on 03/31/2016   IBUPROFEN (ADVIL,MOTRIN) 800 MG TABLET    Take 1 tablet (800 mg total) by mouth every 8 (eight) hours as needed for moderate pain.   LORATADINE (CLARITIN) 10 MG TABLET    Take 1 tablet (10 mg total) by mouth daily.   LOSARTAN (COZAAR) 25 MG TABLET    Take 1 tablet (25 mg total) by mouth 2 (two) times daily.   METFORMIN (GLUCOPHAGE) 1000 MG TABLET    Take 1 tablet (1,000 mg total) by mouth 2 (two) times daily with a meal.   MONTELUKAST (SINGULAIR) 10 MG TABLET    Take 1 tablet (10 mg total) by mouth daily.   MULTIPLE VITAMIN (MULTIVITAMIN) TABLET    Take 1 tablet by mouth daily.   NAPROXEN SODIUM (ANAPROX) 220 MG TABLET    Take 220 mg by mouth as needed. Reported on 05/06/2016   PHENAZOPYRIDINE (PYRIDIUM) 200 MG TABLET    Take 1 tablet (200 mg total) by mouth 3 (three) times daily.   PSYLLIUM (REGULOID) 0.52 G CAPSULE    Take 1 capsule by mouth as directed. Reported on 05/06/2016   SERTRALINE (ZOLOFT) 100 MG TABLET    Take 1  tablet (100 mg total) by mouth daily.   TRUEPLUS LANCETS 26G MISC    1 Device by Does not apply route 4 (four) times daily -  with meals and at bedtime.    Review of Systems  Constitutional: Negative for fever, chills, diaphoresis and fatigue.  HENT: Negative for congestion, ear discharge, ear pain, hearing loss, postnasal drip, sinus pressure, sore throat and tinnitus.   Eyes: Negative for visual disturbance.  Respiratory: Negative for cough.   Cardiovascular: Negative for chest pain.  Gastrointestinal: Negative for nausea, vomiting, abdominal pain and change in bowel habit.  Musculoskeletal: Positive for neck pain (hurts all the time). Negative for myalgias, joint swelling and arthralgias.  Skin: Negative for rash.  Neurological: Positive for dizziness and vertigo. Negative for syncope,  weakness, light-headedness, numbness and headaches.    Social History  Substance Use Topics  . Smoking status: Former Smoker -- 1.50 packs/day for 25 years    Types: Cigarettes  . Smokeless tobacco: Never Used  . Alcohol Use: 0.0 oz/week    0 Standard drinks or equivalent per week     Comment: 1- every 3-4 months   Objective:   BP 120/70 mmHg  Pulse 70  Temp(Src) 98 F (36.7 C) (Oral)  Resp 16  Wt 208 lb 6.4 oz (94.53 kg)  LMP  (Approximate)  Physical Exam  Constitutional: She is oriented to person, place, and time. She appears well-developed and well-nourished. No distress.  HENT:  Head: Normocephalic and atraumatic.  Right Ear: Hearing, tympanic membrane, external ear and ear canal normal.  Left Ear: Hearing, external ear and ear canal normal. Tympanic membrane is bulging. Tympanic membrane is not erythematous. A middle ear effusion is present.  Nose: Nose normal. Right sinus exhibits no maxillary sinus tenderness and no frontal sinus tenderness. Left sinus exhibits no maxillary sinus tenderness and no frontal sinus tenderness.  Mouth/Throat: Uvula is midline, oropharynx is clear and moist and mucous membranes are normal. No oropharyngeal exudate, posterior oropharyngeal edema or posterior oropharyngeal erythema.  Eyes: Conjunctivae and EOM are normal. Pupils are equal, round, and reactive to light. Right eye exhibits no discharge. Left eye exhibits no discharge. No scleral icterus. Right eye exhibits no nystagmus. Left eye exhibits no nystagmus.  Neck: Normal range of motion. Neck supple. No tracheal deviation present. No thyromegaly present.  Cardiovascular: Normal rate, regular rhythm and normal heart sounds.  Exam reveals no gallop and no friction rub.   No murmur heard. Pulmonary/Chest: Effort normal and breath sounds normal. No stridor. No respiratory distress. She has no wheezes. She has no rales.  Lymphadenopathy:    She has no cervical adenopathy.  Neurological: She is  alert and oriented to person, place, and time. She has normal strength. No cranial nerve deficit or sensory deficit. She displays a negative Romberg sign. Coordination and gait normal.  Skin: Skin is warm and dry. She is not diaphoretic.  Vitals reviewed.       Assessment & Plan:     1. BPPV (benign paroxysmal positional vertigo), unspecified laterality Feel most likely secondary to ETD. Will treat ETD and follow up if symptoms fail to improve or worsen. Also instructed patient on Brandt-Daroff exercses for her to do twice daily 3-5 times each side pre day. She is to call the office if no improvement.   2. ETD (eustachian tube dysfunction), left Will treat with claritin and flonase as below. She is to call if symptoms worsen. - fluticasone (FLONASE) 50 MCG/ACT nasal spray; Place 2 sprays  into both nostrils daily.  Dispense: 16 g; Refill: 1  3. Essential hypertension Stable. Diagnosis pulled for medication refill. Continue current medical treatment plan. Will follow up in July for HTN, HLD, and T2DM f/u with labs. - hydrochlorothiazide (HYDRODIURIL) 25 MG tablet; Take 1 tablet (25 mg total) by mouth daily.  Dispense: 90 tablet; Refill: 1  4. Hypercholesterolemia Stable. Diagnosis pulled for medication refill. Continue current medical treatment plan. - pravastatin (PRAVACHOL) 40 MG tablet; Take 1 tablet (40 mg total) by mouth daily.  Dispense: 90 tablet; Refill: 1  5. Allergic rhinitis, unspecified allergic rhinitis type See above medical treatment plan for #2. - loratadine (CLARITIN) 10 MG tablet; Take 1 tablet (10 mg total) by mouth daily.  Dispense: 90 tablet; Refill: 1  6. Vaginal yeast infection Improved but occasional itching. Will give nystatin cream as below for itching prn. - nystatin cream (MYCOSTATIN); Apply 1 application topically 2 (two) times daily as needed for dry skin.  Dispense: 30 g; Refill: 0       Mar Daring, PA-C  [facility]

## 2016-05-13 ENCOUNTER — Other Ambulatory Visit: Payer: Self-pay

## 2016-05-13 VITALS — BP 124/72 | Ht 67.0 in | Wt 206.9 lb

## 2016-05-13 DIAGNOSIS — E1165 Type 2 diabetes mellitus with hyperglycemia: Secondary | ICD-10-CM

## 2016-05-13 NOTE — Patient Outreach (Signed)
[facility]) Care Management  [place]  05/13/2016   LENNIS RADER [birth year] [id number]  Subjective: Patient comes to me with an A1C of 10.3% that she obtained at her MD visit on 03/31/16- she tells me she has started on Jardience 25 mg/day.  She admits to forgetting her medications resulting in the elevated blood sugars but Dr. Barbette Hair note states she was drink orange soda and she told me she drank diet drinks and water. She tells me she has devised a plan to remember to take her medications. She tells me she exercising 5 times per week for 15-20 minutes.  She reports feeling frustrated with work- not really down and reports she thought she didn't need Zoloft and had stopped it for a while but realizes she needs it and is taking it. Alisha tells me she has not had a lancing device to check sugars- I have given her one today.   Objective: Patient has a few blood sugars in her meter, one from today (she reports it was 175m/dl) but the meter said 1931mdl.  The blood sugar before today's was 10 days ago- it was 14146ml.    Encounter Medications:  Outpatient Encounter Prescriptions as of 05/13/2016  Medication Sig Note  . ALPRAZolam (XANAX) 0.5 MG tablet TAKE ONE TABLET BY MOUTH EVERY 8 HOURS (1/2 TO 1 TABLET BEFORE WORK AND EVERY 8 HOURS AS NEEDED)   . aspirin 81 MG tablet Take 81 mg by mouth daily.   . empagliflozin (JARDIANCE) 25 MG TABS tablet Take 25 mg by mouth daily.   . hydrochlorothiazide (HYDRODIURIL) 25 MG tablet Take 1 tablet (25 mg total) by mouth daily.   . lMarland Kitchenratadine (CLARITIN) 10 MG tablet Take 1 tablet (10 mg total) by mouth daily.   . lMarland Kitchensartan (COZAAR) 25 MG tablet Take 1 tablet (25 mg total) by mouth 2 (two) times daily.   . metFORMIN (GLUCOPHAGE) 1000 MG tablet Take 1 tablet (1,000 mg total) by mouth 2 (two) times daily with a meal.   . Multiple Vitamin (MULTIVITAMIN) tablet Take 1 tablet by mouth daily.   . naproxen sodium (ANAPROX) 220 MG  tablet Take 220 mg by mouth as needed. Reported on 05/06/2016   . nystatin cream (MYCOSTATIN) Apply 1 application topically 2 (two) times daily as needed for dry skin.   . pravastatin (PRAVACHOL) 40 MG tablet Take 1 tablet (40 mg total) by mouth daily.   . sertraline (ZOLOFT) 100 MG tablet Take 1 tablet (100 mg total) by mouth daily.   . fluticasone (FLONASE) 50 MCG/ACT nasal spray Place 2 sprays into both nostrils daily. (Patient taking differently: Place 2 sprays into both nostrils daily. When she remembers to take it)   . glucose blood test strip Check blood sugar 4 times daily.   . hydrOXYzine (ATARAX/VISTARIL) 25 MG tablet Take 1 tablet by mouth 3 (three) times daily as needed. Reported on 03/31/2016 08/01/2015: Medication taken as needed.  Received from: [place] by mouth.  . iMarland Kitchenuprofen (ADVIL,MOTRIN) 800 MG tablet Take 1 tablet (800 mg total) by mouth every 8 (eight) hours as needed for moderate pain. (Patient not taking: Reported on 05/06/2016)   . montelukast (SINGULAIR) 10 MG tablet Take 1 tablet (10 mg total) by mouth daily. (Patient not taking: Reported on 05/13/2016)   . psyllium (REGULOID) 0.52 G capsule Take 1 capsule by mouth as directed. Reported on 05/13/2016 08/01/2015: Received from: [place] by mouth.  .Marland Kitchen  TRUEPLUS LANCETS 26G MISC 1 Device by Does not apply route 4 (four) times daily -  with meals and at bedtime.    No facility-administered encounter medications on file as of 05/13/2016.    Functional Status:  In your present state of health, do you have any difficulty performing the following activities: 12/30/2015 09/11/2015  Hearing? N N  Vision? N N  Difficulty concentrating or making decisions? N N  Walking or climbing stairs? N N  Dressing or bathing? N N  Doing errands, shopping? N N    Fall/Depression Screening: PHQ 2/9 Scores 05/13/2016 02/11/2016 12/30/2015 09/11/2015 05/27/2015  PHQ - 2 Score 2 0  0 2 0  PHQ- 9 Score 3 - - 8 -    Assessment:   Plan:  [facility] CM Care Plan Problem Two        Most Recent Value   Care Plan Problem Two  Elevated A1C   Role Documenting the Problem Two  Care Management [place] for Problem Two  Active   Interventions for Problem Two Long Term Goal   1. check sugar 2x/day 2. Schedule dental and dilated eye by December 2017, Walk 20 minutes 3 x/week .  4. make an apppointment with MD for July 2017   [facility] Long Term Goal (31-90) days  Patient will lower her A1C - below 10.3% when it is checked again by MD in 3 months.    [facility] Long Term Goal Start Date  05/13/16   [facility] Long Term Goal Met Date  -- [Did not meet goals- A1C 10.3% - restart goal today]      I will see her in 1 month.  Gentry Fitz, RN, BA, MHA, CDE Diabetes Coordinator Inpatient Diabetes Program  [phone] (Team Pager) [phone] ([place]) 05/13/2016 3:26 PM

## 2016-06-10 ENCOUNTER — Ambulatory Visit: Payer: Self-pay

## 2016-06-15 ENCOUNTER — Encounter: Payer: Self-pay | Admitting: Physician Assistant

## 2016-06-15 ENCOUNTER — Ambulatory Visit: Payer: Self-pay | Admitting: Physician Assistant

## 2016-06-15 VITALS — BP 140/80 | HR 64 | Temp 97.8°F

## 2016-06-15 DIAGNOSIS — M25511 Pain in right shoulder: Secondary | ICD-10-CM

## 2016-06-15 NOTE — Progress Notes (Signed)
S: c/o r shoulder pain, started this morning, hasn't taken any meds for it;  says she had the same sx a few years ago, thinks its from throwing newspapers or picking them up, no numbness or tingling, pain is located at scapula, increased with overhead reach, with cough or sneezing  O: vitals wnl, nad, lungs c t a, cv rrr, cspine nontender, r shoulder neg for bony tenderness, bursa of scapula a little tender, full rom, n/v intact  A: acute shoulder pain  P: take otc naproxen, use ice, will refer to ortho if not better in 1 week, go to ER if worsening

## 2016-06-29 ENCOUNTER — Other Ambulatory Visit: Payer: Self-pay

## 2016-06-29 VITALS — BP 132/74 | Ht 67.0 in | Wt 205.9 lb

## 2016-06-29 DIAGNOSIS — E1165 Type 2 diabetes mellitus with hyperglycemia: Secondary | ICD-10-CM

## 2016-06-29 NOTE — Patient Outreach (Signed)
[facility]) Care Management  [place]  06/29/2016   [name] August 07, 1956 [id number]  Subjective: [name] came to see me for her Link to Wellness visit- she did NOT bring her meter. She tells me am blood sugars are 130-140mg /dl and after supper are 120-160mg /dl.  Her last A1C was 10.3% when it was checked in April 2017 and she had her Jardience 25mg  started at that time.   Objective: No blood sugar records to view Filed Vitals:   06/29/16 1336  BP: 132/74  Height: 1.702 m (5\' 7" )  Weight: 205 lb 14.4 oz (93.396 kg)     Encounter Medications:  Outpatient Encounter Prescriptions as of 06/29/2016  Medication Sig Note  . ALPRAZolam (XANAX) 0.5 MG tablet TAKE ONE TABLET BY MOUTH EVERY 8 HOURS (1/2 TO 1 TABLET BEFORE WORK AND EVERY 8 HOURS AS NEEDED)   . aspirin 81 MG tablet Take 81 mg by mouth daily.   . empagliflozin (JARDIANCE) 25 MG TABS tablet Take 25 mg by mouth daily.   . fluticasone (FLONASE) 50 MCG/ACT nasal spray Place 2 sprays into both nostrils daily. (Patient taking differently: Place 2 sprays into both nostrils daily. When she remembers to take it)   . glucose blood test strip Check blood sugar 4 times daily.   . hydrochlorothiazide (HYDRODIURIL) 25 MG tablet Take 1 tablet (25 mg total) by mouth daily.   Marland Kitchen ibuprofen (ADVIL,MOTRIN) 800 MG tablet Take 1 tablet (800 mg total) by mouth every 8 (eight) hours as needed for moderate pain.   Marland Kitchen loratadine (CLARITIN) 10 MG tablet Take 1 tablet (10 mg total) by mouth daily.   Marland Kitchen losartan (COZAAR) 25 MG tablet Take 1 tablet (25 mg total) by mouth 2 (two) times daily.   . metFORMIN (GLUCOPHAGE) 1000 MG tablet Take 1 tablet (1,000 mg total) by mouth 2 (two) times daily with a meal.   . montelukast (SINGULAIR) 10 MG tablet Take 1 tablet (10 mg total) by mouth daily.   . Multiple Vitamin (MULTIVITAMIN) tablet Take 1 tablet by mouth daily.   . naproxen sodium (ANAPROX) 220 MG tablet Take 220 mg by mouth as  needed. Reported on 05/06/2016   . nystatin cream (MYCOSTATIN) Apply 1 application topically 2 (two) times daily as needed for dry skin.   . pravastatin (PRAVACHOL) 40 MG tablet Take 1 tablet (40 mg total) by mouth daily.   . sertraline (ZOLOFT) 100 MG tablet Take 1 tablet (100 mg total) by mouth daily.   . TRUEPLUS LANCETS 26G MISC 1 Device by Does not apply route 4 (four) times daily -  with meals and at bedtime.   . hydrOXYzine (ATARAX/VISTARIL) 25 MG tablet Take 1 tablet by mouth 3 (three) times daily as needed. Reported on 06/29/2016 08/01/2015: Medication taken as needed.  Received from: [place]: Take by mouth.  . psyllium (REGULOID) 0.52 G capsule Take 1 capsule by mouth as directed. Reported on 06/29/2016 08/01/2015: Received from: [place]: Take by mouth.   No facility-administered encounter medications on file as of 06/29/2016.    Functional Status:  In your present state of health, do you have any difficulty performing the following activities: 06/29/2016 12/30/2015  Hearing? N N  Vision? N N  Difficulty concentrating or making decisions? Y N  Walking or climbing stairs? N N  Dressing or bathing? N N  Doing errands, shopping? N N    Fall/Depression Screening: [facility] 2/9 Scores 06/29/2016 05/13/2016 02/11/2016 12/30/2015  09/11/2015 05/27/2015  PHQ - 2 Score 0 2 0 0 2 0  PHQ- 9 Score - 3 - - 8 -    Assessment: [name] complains of excess itching over her entire body.- she is unsure of the cause. I am unable to see any rash or reddened areas. We reviewed the need for a dilated eye exam and a dental visit- she has scheduled to see her doctor next week.   Plan: Follow up with MD in 1 week- labs will be taken at that visit. She needs to continue to exercise 4 days per week for 15 minutes (she tells me she walks when she gets home from work).  I have asked her to bring her meter to her MD appointment for review.  I will follow up  with her 1 month.   Gentry Fitz, RN, BA, [place], [place] Direct Dial:  [phone]  Fax:  [phone] E-mail: [email or web address] [address] [place], [place]  [address]

## 2016-07-07 ENCOUNTER — Ambulatory Visit (INDEPENDENT_AMBULATORY_CARE_PROVIDER_SITE_OTHER): Payer: 59 | Admitting: Physician Assistant

## 2016-07-07 ENCOUNTER — Encounter: Payer: Self-pay | Admitting: Physician Assistant

## 2016-07-07 VITALS — BP 132/78 | HR 72 | Temp 98.4°F | Resp 16 | Wt 207.0 lb

## 2016-07-07 DIAGNOSIS — F329 Major depressive disorder, single episode, unspecified: Secondary | ICD-10-CM

## 2016-07-07 DIAGNOSIS — J309 Allergic rhinitis, unspecified: Secondary | ICD-10-CM | POA: Diagnosis not present

## 2016-07-07 DIAGNOSIS — E1122 Type 2 diabetes mellitus with diabetic chronic kidney disease: Secondary | ICD-10-CM

## 2016-07-07 DIAGNOSIS — F419 Anxiety disorder, unspecified: Secondary | ICD-10-CM

## 2016-07-07 DIAGNOSIS — E7800 Pure hypercholesterolemia, unspecified: Secondary | ICD-10-CM | POA: Diagnosis not present

## 2016-07-07 DIAGNOSIS — N182 Chronic kidney disease, stage 2 (mild): Secondary | ICD-10-CM | POA: Diagnosis not present

## 2016-07-07 DIAGNOSIS — I10 Essential (primary) hypertension: Secondary | ICD-10-CM

## 2016-07-07 DIAGNOSIS — F32A Depression, unspecified: Secondary | ICD-10-CM

## 2016-07-07 LAB — POCT GLYCOSYLATED HEMOGLOBIN (HGB A1C)
Est. average glucose Bld gHb Est-mCnc: 206
Hemoglobin A1C: 8.8

## 2016-07-07 MED ORDER — METFORMIN HCL 1000 MG PO TABS: 1000.0000 mg | ORAL_TABLET | Freq: Two times a day (BID) | ORAL | 1 refills | Status: DC

## 2016-07-07 MED ORDER — MONTELUKAST SODIUM 10 MG PO TABS: 10.0000 mg | ORAL_TABLET | Freq: Every day | ORAL | 1 refills | Status: DC

## 2016-07-07 MED ORDER — LINAGLIPTIN 5 MG PO TABS: 5.0000 mg | ORAL_TABLET | Freq: Every day | ORAL | 4 refills | Status: DC

## 2016-07-07 MED ORDER — SERTRALINE HCL 100 MG PO TABS: 100.0000 mg | ORAL_TABLET | Freq: Every day | ORAL | 3 refills | Status: DC

## 2016-07-07 MED ORDER — LOSARTAN POTASSIUM 25 MG PO TABS: 25.0000 mg | ORAL_TABLET | Freq: Two times a day (BID) | ORAL | 1 refills | Status: DC

## 2016-07-07 MED ORDER — ALPRAZOLAM 0.5 MG PO TABS: ORAL_TABLET | ORAL | 1 refills | Status: DC

## 2016-07-07 MED ORDER — ALPRAZOLAM 0.5 MG PO TABS
ORAL_TABLET | ORAL | 1 refills | Status: DC
Start: 2016-07-07 — End: 2016-11-10

## 2016-07-07 NOTE — Patient Instructions (Signed)
Linagliptin oral tablets What is this medicine? Linagliptin (lin a GLIP tin) helps to treat type 2 diabetes. It helps to control blood sugar. Treatment is combined with diet and exercise. This medicine may be used for other purposes; ask your health care provider or pharmacist if you have questions. What should I tell my health care provider before I take this medicine? They need to know if you have any of these conditions: -diabetic ketoacidosis -type 1 diabetes -an unusual or allergic reaction to linagliptin, other medicines, foods, dyes, or preservatives -pregnant or trying to get pregnant -breast-feeding How should I use this medicine? Take this medicine by mouth with a glass of water. Follow the directions on the prescription label. You can take it with or without food. Take your dose at the same time each day. Do not take more often than directed. Do not stop taking except on your doctor's advice. A special MedGuide will be given to you by the pharmacist with each prescription and refill. Be sure to read this information carefully each time. Talk to your pediatrician regarding the use of this medicine in children. Special care may be needed. Overdosage: If you think you have taken too much of this medicine contact a poison control center or emergency room at once. NOTE: This medicine is only for you. Do not share this medicine with others. What if I miss a dose? If you miss a dose, take it as soon as you can. If it is almost time for your next dose, take only that dose. Do not take double or extra doses. What may interact with this medicine? Do not take this medicine with any of the following medications: -gatifloxacin This medicine may also interact with the following medications: -alcohol -bosentan -certain medicines for seizures like carbamazepine, phenobarbital, phenytoin -rifabutin -rifampin -St. Johns Wort -sulfonylureas like glimepiride, glipizide, glyburide This list may  not describe all possible interactions. Give your health care provider a list of all the medicines, herbs, non-prescription drugs, or dietary supplements you use. Also tell them if you smoke, drink alcohol, or use illegal drugs. Some items may interact with your medicine. What should I watch for while using this medicine? Visit your doctor or health care professional for regular checks on your progress. A test called the HbA1C (A1C) will be monitored. This is a simple blood test. It measures your blood sugar control over the last 2 to 3 months. You will receive this test every 3 to 6 months. Learn how to check your blood sugar. Learn the symptoms of low and high blood sugar and how to manage them. Always carry a quick-source of sugar with you in case you have symptoms of low blood sugar. Examples include hard sugar candy or glucose tablets. Make sure others know that you can choke if you eat or drink when you develop serious symptoms of low blood sugar, such as seizures or unconsciousness. They must get medical help at once. Tell your doctor or health care professional if you have high blood sugar. You might need to change the dose of your medicine. If you are sick or exercising more than usual, you might need to change the dose of your medicine. Do not skip meals. Ask your doctor or health care professional if you should avoid alcohol. Many nonprescription cough and cold products contain sugar or alcohol. These can affect blood sugar. Wear a medical ID bracelet or chain, and carry a card that describes your disease and details of your medicine and dosage  times. What side effects may I notice from receiving this medicine? Side effects that you should report to your doctor or health care professional as soon as possible: -allergic reactions like skin rash, itching or hives, swelling of the face, lips, or tongue -breathing problems -fever, chills -joint pain -nausea, vomiting -signs and symptoms of low  blood sugar such as feeling anxious, confusion, dizziness, increased hunger, unusually weak or tired, sweating, shakiness, cold, irritable, headache, blurred vision, fast heartbeat, loss of consciousness -unusual stomach pain or discomfort -vomiting Side effects that usually do not require medical attention (Report these to your doctor or health care professional if they continue or are bothersome.): -headache -sore throat -stuffy or runny nose This list may not describe all possible side effects. Call your doctor for medical advice about side effects. You may report side effects to FDA at 1-800-FDA-1088. Where should I keep my medicine? Keep out of the reach of children. Store at room temperature between 15 and 30 degrees C (59 and 86 degrees F). Throw away any unused medicine after the expiration date. NOTE: This sheet is a summary. It may not cover all possible information. If you have questions about this medicine, talk to your doctor, pharmacist, or health care provider.    2016, Elsevier/Gold Standard. (2014-08-10 17:55:49)

## 2016-07-07 NOTE — Progress Notes (Signed)
Patient: [name] Female    DOB: 12-Mar-1956   60 y.o.   MRN: [id number] Visit Date: 07/07/2016  Today's Provider: Mar Daring, PA-C   Chief Complaint  Patient presents with  . Diabetes  . Hypertension  . Hyperlipidemia   Subjective:    HPI  Diabetes Mellitus Type II, Follow-up:   Lab Results  Component Value Date   HGBA1C 8.8 07/07/2016   HGBA1C 10.3 03/31/2016   HGBA1C 8.5 12/30/2015   Last seen for diabetes 3 months ago.  Management since then includes starting Jardiance. Patient reports that she had to stop taking due to severe itching. She has not taken medication since Saturday (3 days).  She reports fair compliance with treatment. She is having side effects of itching all over now.   Episodes of hypoglycemia? no   Current Insulin Regimen: none Most Recent Eye Exam:  Weight trend: stable Prior visit with dietician: no Current diet: well balanced, trying to avoid soda Current exercise: none  ------------------------------------------------------------------------   Hypertension, follow-up:  BP Readings from Last 3 Encounters:  07/07/16 132/78  06/29/16 132/74  06/15/16 140/80    She was last seen for hypertension 3 months ago.  BP at that visit was 140/80. Management since that visit includes no changes.She reports good compliance with treatment. She is not having side effects.  She is not exercising. She is not adherent to low salt diet.   Outside blood pressures are checked occasionally. She is experiencing none.   ------------------------------------------------------------------------    Lipid/Cholesterol, Follow-up:   Last seen for this 3 months ago.  Management since that visit includes no change.  Last Lipid Panel:    Component Value Date/Time   CHOL 243 (H) 08/06/2015 1238   CHOL 205 (H) 09/06/2013 1518   TRIG 214 (H) 08/06/2015 1238   TRIG 174 09/06/2013 1518   HDL 48 08/06/2015 1238   HDL 53 09/06/2013  1518   CHOLHDL 5.1 08/06/2015 1238   VLDL 43 (H) 08/06/2015 1238   VLDL 35 09/06/2013 1518   LDLCALC 152 (H) 08/06/2015 1238   LDLCALC 117 (H) 09/06/2013 1518    She reports good compliance with treatment. ------------------------------------------------------------------------     Allergies  Allergen Reactions  . Penicillins    Current Meds  Medication Sig  . ALPRAZolam (XANAX) 0.5 MG tablet TAKE ONE TABLET BY MOUTH EVERY 8 HOURS (1/2 TO 1 TABLET BEFORE WORK AND EVERY 8 HOURS AS NEEDED)  . aspirin 81 MG tablet Take 81 mg by mouth daily.  . fluticasone (FLONASE) 50 MCG/ACT nasal spray Place 2 sprays into both nostrils daily. (Patient taking differently: Place 2 sprays into both nostrils daily. When she remembers to take it)  . glucose blood test strip Check blood sugar 4 times daily.  . hydrochlorothiazide (HYDRODIURIL) 25 MG tablet Take 1 tablet (25 mg total) by mouth daily.  . hydrOXYzine (ATARAX/VISTARIL) 25 MG tablet Take 1 tablet by mouth 3 (three) times daily as needed. Reported on 06/29/2016  . ibuprofen (ADVIL,MOTRIN) 800 MG tablet Take 1 tablet (800 mg total) by mouth every 8 (eight) hours as needed for moderate pain.  Marland Kitchen loratadine (CLARITIN) 10 MG tablet Take 1 tablet (10 mg total) by mouth daily.  Marland Kitchen losartan (COZAAR) 25 MG tablet Take 1 tablet (25 mg total) by mouth 2 (two) times daily.  . metFORMIN (GLUCOPHAGE) 1000 MG tablet Take 1 tablet (1,000 mg total) by mouth 2 (two) times daily with a meal.  . montelukast (  SINGULAIR) 10 MG tablet Take 1 tablet (10 mg total) by mouth daily.  . Multiple Vitamin (MULTIVITAMIN) tablet Take 1 tablet by mouth daily.  . naproxen sodium (ANAPROX) 220 MG tablet Take 220 mg by mouth as needed. Reported on 05/06/2016  . nystatin cream (MYCOSTATIN) Apply 1 application topically 2 (two) times daily as needed for dry skin.  . pravastatin (PRAVACHOL) 40 MG tablet Take 1 tablet (40 mg total) by mouth daily.  . psyllium (REGULOID) 0.52 G capsule  Take 1 capsule by mouth as directed. Reported on 06/29/2016  . sertraline (ZOLOFT) 100 MG tablet Take 1 tablet (100 mg total) by mouth daily.  . TRUEPLUS LANCETS 26G MISC 1 Device by Does not apply route 4 (four) times daily -  with meals and at bedtime.    Review of Systems  Constitutional: Negative.   Respiratory: Negative.   Cardiovascular: Negative.   Endocrine: Negative.   Musculoskeletal: Negative.   Skin: Negative.     Social History  Substance Use Topics  . Smoking status: Former Smoker    Packs/day: 1.50    Years: 25.00    Types: Cigarettes  . Smokeless tobacco: Never Used  . Alcohol use 0.0 oz/week     Comment: 1- every 3-4 months   Objective:   BP 132/78 (BP Location: Right Arm, Patient Position: Sitting, Cuff Size: Normal)   Pulse 72   Temp 98.4 F (36.9 C)   Resp 16   Wt 207 lb (93.9 kg)   LMP  (Approximate) Comment: over 10 years ago  BMI 32.42 kg/m   Physical Exam  Constitutional: She appears well-developed and well-nourished. No distress.  Neck: Normal range of motion. Neck supple. No tracheal deviation present. No thyromegaly present.  Cardiovascular: Normal rate, regular rhythm and normal heart sounds.  Exam reveals no gallop and no friction rub.   No murmur heard. Pulmonary/Chest: Effort normal and breath sounds normal. No respiratory distress. She has no wheezes. She has no rales.  Musculoskeletal: She exhibits no edema.  Lymphadenopathy:    She has no cervical adenopathy.  Skin: She is not diaphoretic.  Vitals reviewed.     Assessment & Plan:     1. Type 2 diabetes mellitus with stage 2 chronic kidney disease, without long-term current use of insulin (HCC) HgBA1c improved from 10.3 to 8.8. She has been having itching with Jardiance. Will change to Tradjenta as below to see if she tolerates this better. Continue metformin. I will see her back in one month to see how she is tolerating the medication. We will recheck all labs at that time.  - POCT  glycosylated hemoglobin (Hb A1C) - linagliptin (TRADJENTA) 5 MG TABS tablet; Take 1 tablet (5 mg total) by mouth daily.  Dispense: 7 tablet; Refill: 4 - metFORMIN (GLUCOPHAGE) 1000 MG tablet; Take 1 tablet (1,000 mg total) by mouth 2 (two) times daily with a meal.  Dispense: 180 tablet; Refill: 1  2. Essential hypertension Stable. Diagnosis pulled for medication refill. Continue current medical treatment plan.  - losartan (COZAAR) 25 MG tablet; Take 1 tablet (25 mg total) by mouth 2 (two) times daily.  Dispense: 180 tablet; Refill: 1  3. Hypercholesterolemia without hypertriglyceridemia Stable. Continue current medical treatment plan. I will check labs next month at f/u.  4. Allergic rhinitis, unspecified allergic rhinitis type Stable. Diagnosis pulled for medication refill. Continue current medical treatment plan. - montelukast (SINGULAIR) 10 MG tablet; Take 1 tablet (10 mg total) by mouth daily.  Dispense: 90 tablet; Refill:  1  5. Depression Stable. Diagnosis pulled for medication refill. Continue current medical treatment plan. - sertraline (ZOLOFT) 100 MG tablet; Take 1 tablet (100 mg total) by mouth daily.  Dispense: 90 tablet; Refill: 3  6. Acute anxiety Stable. Diagnosis pulled for medication refill. Continue current medical treatment plan. - ALPRAZolam (XANAX) 0.5 MG tablet; TAKE ONE TABLET BY MOUTH EVERY 8 HOURS (1/2 TO 1 TABLET BEFORE WORK AND EVERY 8 HOURS AS NEEDED)  Dispense: 30 tablet; Refill: 1 The entirety of the information documented in the History of Present Illness, Review of Systems and Physical Exam were personally obtained by me. Portions of this information were initially documented by Lyndel Pleasure, CMA and reviewed by me for thoroughness and accuracy.      Mar Daring, PA-C  [facility]

## 2016-08-06 ENCOUNTER — Ambulatory Visit: Payer: 59 | Admitting: Physician Assistant

## 2016-08-11 ENCOUNTER — Ambulatory Visit: Payer: Self-pay

## 2016-08-11 ENCOUNTER — Other Ambulatory Visit: Payer: Self-pay

## 2016-08-11 NOTE — Patient Outreach (Signed)
[place]) Care Management  08/11/2016  [name] [birth year] [id number]   Patient did not show for her scheduled visit today.  I have called her to reschedule the visit.   Gentry Fitz, RN, BA, [place], [place] Direct Dial:  [phone]  Fax:  [phone] E-mail: [email or web address] [address] [place], [place]  [address]

## 2016-08-31 NOTE — Patient Outreach (Signed)
[place] [facility]) Care Management  08/31/2016  [name] [birth year] [id number]   Patient did not show to 08/11/16 despite a phone call reminder and a voice message left on her phone.  Letter sent today to reschedule or be d/c in program by 09/11/16.   Gentry Fitz, RN, BA, [place], [place] Direct Dial:  [phone]  Fax:  [phone] E-mail: [email or web address] [address] [place], [place]  [address]

## 2016-09-17 DIAGNOSIS — H2512 Age-related nuclear cataract, left eye: Secondary | ICD-10-CM | POA: Diagnosis not present

## 2016-09-21 ENCOUNTER — Other Ambulatory Visit: Payer: Self-pay

## 2016-09-21 VITALS — BP 172/88 | Ht 67.0 in | Wt 207.8 lb

## 2016-09-21 NOTE — Patient Outreach (Signed)
[facility]) Care Management  [place]  09/21/2016   [name] 15-Aug-1956 [id number]  Subjective: Patient in for follow up diabetes Link to Wellness visit. She again did not bring her meter with her when she came to see me. She reports that her fasting blood sugars are around 140 mg/dl and 2 hour post prandial blood sugars are 140-160 mg/dl. She has had a dilated eye exam and is scheduled to have a left cataract removed on December 02, 2016.  She ran out of [place] 2 weeks ago (she only had samples)- she has scheduled a follow up appointment with her MD on September 24, 2016.   Objective:  Vitals:   09/21/16 1335  BP: (!) 172/88  Weight: 207 lb 12.8 oz (94.3 kg)  Height: 1.702 m (_0 )     Encounter Medications:  Outpatient Encounter Prescriptions as of 09/21/2016  Medication Sig Note  . ALPRAZolam (XANAX) 0.5 MG tablet TAKE ONE TABLET BY MOUTH EVERY 8 HOURS (1/2 TO 1 TABLET BEFORE WORK AND EVERY 8 HOURS AS NEEDED)   . aspirin 81 MG tablet Take 81 mg by mouth daily.   Marland Kitchen glucose blood test strip Check blood sugar 4 times daily.   . hydrochlorothiazide (HYDRODIURIL) 25 MG tablet Take 1 tablet (25 mg total) by mouth daily.   Marland Kitchen linagliptin (TRADJENTA) 5 MG TABS tablet Take 1 tablet (5 mg total) by mouth daily.   Marland Kitchen losartan (COZAAR) 25 MG tablet Take 1 tablet (25 mg total) by mouth 2 (two) times daily.   . metFORMIN (GLUCOPHAGE) 1000 MG tablet Take 1 tablet (1,000 mg total) by mouth 2 (two) times daily with a meal.   . montelukast (SINGULAIR) 10 MG tablet Take 1 tablet (10 mg total) by mouth daily.   . naproxen sodium (ANAPROX) 220 MG tablet Take 220 mg by mouth as needed. Reported on 05/06/2016   . pravastatin (PRAVACHOL) 40 MG tablet Take 1 tablet (40 mg total) by mouth daily.   . sertraline (ZOLOFT) 100 MG tablet Take 1 tablet (100 mg total) by mouth daily.   . TRUEPLUS LANCETS 26G MISC 1 Device by Does not apply route 4 (four) times daily -  with meals  and at bedtime.   . fluticasone (FLONASE) 50 MCG/ACT nasal spray Place 2 sprays into both nostrils daily. (Patient not taking: Reported on 09/21/2016)   . hydrOXYzine (ATARAX/VISTARIL) 25 MG tablet Take 1 tablet by mouth 3 (three) times daily as needed. Reported on 06/29/2016 08/01/2015: Medication taken as needed.  Received from: [place]: Take by mouth.  . loratadine (CLARITIN) 10 MG tablet Take 1 tablet (10 mg total) by mouth daily. (Patient not taking: Reported on 09/21/2016)   . Multiple Vitamin (MULTIVITAMIN) tablet Take 1 tablet by mouth daily.   Marland Kitchen nystatin cream (MYCOSTATIN) Apply 1 application topically 2 (two) times daily as needed for dry skin. (Patient not taking: Reported on 09/21/2016)   . psyllium (REGULOID) 0.52 G capsule Take 1 capsule by mouth as directed. Reported on 06/29/2016 08/01/2015: Received from: [place]: Take by mouth.   No facility-administered encounter medications on file as of 09/21/2016.     Functional Status:  In your present state of health, do you have any difficulty performing the following activities: 09/21/2016 06/29/2016  Hearing? N N  Vision? N N  Difficulty concentrating or making decisions? N Y  Walking or climbing stairs? N N  Dressing or bathing? N N  Doing  errands, shopping? N N  Some recent data might be hidden    Fall/Depression Screening: PHQ 2/9 Scores 09/21/2016 06/29/2016 05/13/2016 02/11/2016 12/30/2015 09/11/2015 05/27/2015  PHQ - 2 Score 0 0 2 0 0 2 0  PHQ- 9 Score - - 3 - - 8 -    Assessment: I reviewed with Breane the importance of taking her medications as ordered.  She did not take her BP medication today yet (BP 172/88).  She needs to be more proactive in her care and should have seen the doctor before she ran out of medications.  She also missed an appointment with me in August and had to be prompted to schedule today's visit. She has an excuse for forgetting her meter every  time she visits with me that I wonder if she's really checking her sugars.   Plan:  [facility] CM Care Plan Problem One   Flowsheet Row Most Recent Value  Care Plan Problem One  needs preventative care- dental, eye eams- will complete by the end of the year  Role Documenting the Problem One  Care Management [place] for Problem One  Not Active [1. schedule dilated eye exam  2. schedule cataract surgery  3. schedule dental cleaning]  THN Long Term Goal (31-90 days)  Complete preventative exams by the end of 2016  [facility] Long Term Goal Start Date  05/27/15  [facility] Long Term Goal Met Date  12/30/15 [not completed- see notes]  Interventions for Problem One Long Term Goal  Schedule each visit (dental, dilated eye, full physical, mammogram, cataract, partial plate) 1 per week on your days off .  Buy dental picks and  begin flossing    [facility] CM Care Plan Problem Two   Flowsheet Row Most Recent Value  Care Plan Problem Two  Elevated A1C  Role Documenting the Problem Two  Care Management Coordinator  Care Plan for Problem Two  Active  Interventions for Problem Two Long Term Goal   reviewed the need to check BG on a regular basis 2. discussed the importance of exercise  3. discussed the importance of follow up MD, dilated eye and dental cleaning  THN Long Term Goal (31-90) days  Patient will lower her A1C - below 10.3% when it is checked again by MD in 3 months.   THN Long Term Goal Start Date  05/13/16  THN Long Term Goal Met Date  -- [Did not meet goals- A1C 10.3% - restart goal today]     Follow up in 1 month.   Gentry Fitz, RN, BA, [place], [place] Direct Dial:  [phone]  Fax:  [phone] E-mail: [email or web address] [address] [place],   [address]

## 2016-09-24 ENCOUNTER — Encounter: Payer: Self-pay | Admitting: Physician Assistant

## 2016-09-24 ENCOUNTER — Ambulatory Visit (INDEPENDENT_AMBULATORY_CARE_PROVIDER_SITE_OTHER): Payer: 59 | Admitting: Physician Assistant

## 2016-09-24 VITALS — BP 148/90 | HR 60 | Temp 98.1°F | Resp 16 | Wt 207.6 lb

## 2016-09-24 DIAGNOSIS — E1122 Type 2 diabetes mellitus with diabetic chronic kidney disease: Secondary | ICD-10-CM

## 2016-09-24 DIAGNOSIS — N182 Chronic kidney disease, stage 2 (mild): Secondary | ICD-10-CM

## 2016-09-24 DIAGNOSIS — I10 Essential (primary) hypertension: Secondary | ICD-10-CM

## 2016-09-24 DIAGNOSIS — E7800 Pure hypercholesterolemia, unspecified: Secondary | ICD-10-CM | POA: Diagnosis not present

## 2016-09-24 LAB — POCT GLYCOSYLATED HEMOGLOBIN (HGB A1C)
Est. average glucose Bld gHb Est-mCnc: 200
Hemoglobin A1C: 8.6

## 2016-09-24 MED ORDER — EMPAGLIFLOZIN 25 MG PO TABS: 25.0000 mg | ORAL_TABLET | Freq: Every day | ORAL | 1 refills | Status: DC

## 2016-09-24 NOTE — Progress Notes (Signed)
Patient: [name] Female    DOB: 1956-10-31   60 y.o.   MRN: [id number] Visit Date: 09/24/2016  Today's Provider: Mar Daring, PA-C   Chief Complaint  Patient presents with  . Follow-up    Diabetes   Subjective:    HPI  Diabetes Mellitus Type II, Follow-up:   Lab Results  Component Value Date   HGBA1C 8.6 09/24/2016   HGBA1C 8.8 07/07/2016   HGBA1C 10.3 03/31/2016    Last seen for diabetes 3 months ago.  Management since then includes patient was changed to [place] and continue Metformin. She reports excellent compliance with treatment. She is not having side effects.  Current symptoms include none and have been stable.   Current Insulin Regimen:  Most Recent Eye Exam: Per patient she is having eye Surgery in December Weight trend: stable Prior visit with dietician: yes  Current diet: in general, an "unhealthy" diet Current exercise: walking  Pertinent Labs:    Component Value Date/Time   CHOL 243 (H) 08/06/2015 1238   CHOL 205 (H) 09/06/2013 1518   TRIG 214 (H) 08/06/2015 1238   TRIG 174 09/06/2013 1518   HDL 48 08/06/2015 1238   HDL 53 09/06/2013 1518   LDLCALC 152 (H) 08/06/2015 1238   LDLCALC 117 (H) 09/06/2013 1518   CREATININE 1.12 (H) 02/25/2016 1533   CREATININE 0.80 09/06/2013 1518    Wt Readings from Last 3 Encounters:  09/24/16 207 lb 9.6 oz (94.2 kg)  09/21/16 207 lb 12.8 oz (94.3 kg)  07/07/16 207 lb (93.9 kg)    ------------------------------------------------------------------------     Allergies  Allergen Reactions  . Penicillins      Current Outpatient Prescriptions:  .  aspirin 81 MG tablet, Take 81 mg by mouth daily., Disp: , Rfl:  .  glucose blood test strip, Check blood sugar 4 times daily., Disp: 100 each, Rfl: 12 .  hydrochlorothiazide (HYDRODIURIL) 25 MG tablet, Take 1 tablet (25 mg total) by mouth daily., Disp: 90 tablet, Rfl: 1 .  losartan (COZAAR) 25 MG tablet, Take 1 tablet (25 mg total) by  mouth 2 (two) times daily., Disp: 180 tablet, Rfl: 1 .  metFORMIN (GLUCOPHAGE) 1000 MG tablet, Take 1 tablet (1,000 mg total) by mouth 2 (two) times daily with a meal., Disp: 180 tablet, Rfl: 1 .  montelukast (SINGULAIR) 10 MG tablet, Take 1 tablet (10 mg total) by mouth daily., Disp: 90 tablet, Rfl: 1 .  Multiple Vitamin (MULTIVITAMIN) tablet, Take 1 tablet by mouth daily., Disp: , Rfl:  .  pravastatin (PRAVACHOL) 40 MG tablet, Take 1 tablet (40 mg total) by mouth daily., Disp: 90 tablet, Rfl: 1 .  sertraline (ZOLOFT) 100 MG tablet, Take 1 tablet (100 mg total) by mouth daily., Disp: 90 tablet, Rfl: 3 .  TRUEPLUS LANCETS 26G MISC, 1 Device by Does not apply route 4 (four) times daily -  with meals and at bedtime., Disp: 100 each, Rfl: 12 .  ALPRAZolam (XANAX) 0.5 MG tablet, TAKE ONE TABLET BY MOUTH EVERY 8 HOURS (1/2 TO 1 TABLET BEFORE WORK AND EVERY 8 HOURS AS NEEDED) (Patient not taking: Reported on 09/24/2016), Disp: 30 tablet, Rfl: 1 .  empagliflozin (JARDIANCE) 25 MG TABS tablet, Take 25 mg by mouth daily., Disp: 90 tablet, Rfl: 1 .  fluticasone (FLONASE) 50 MCG/ACT nasal spray, Place 2 sprays into both nostrils daily. (Patient not taking: Reported on 09/24/2016), Disp: 16 g, Rfl: 1 .  hydrOXYzine (ATARAX/VISTARIL) 25 MG tablet,  Take 1 tablet by mouth 3 (three) times daily as needed. Reported on 06/29/2016, Disp: , Rfl:  .  loratadine (CLARITIN) 10 MG tablet, Take 1 tablet (10 mg total) by mouth daily. (Patient not taking: Reported on 09/24/2016), Disp: 90 tablet, Rfl: 1 .  naproxen sodium (ANAPROX) 220 MG tablet, Take 220 mg by mouth as needed. Reported on 05/06/2016, Disp: , Rfl:  .  nystatin cream (MYCOSTATIN), Apply 1 application topically 2 (two) times daily as needed for dry skin. (Patient not taking: Reported on 09/24/2016), Disp: 30 g, Rfl: 0 .  psyllium (REGULOID) 0.52 G capsule, Take 1 capsule by mouth as directed. Reported on 06/29/2016, Disp: , Rfl:   Review of Systems    Constitutional: Negative.   Respiratory: Negative.   Cardiovascular: Negative.   Gastrointestinal: Negative.   Neurological: Negative.     Social History  Substance Use Topics  . Smoking status: Former Smoker    Packs/day: 1.50    Years: 25.00    Types: Cigarettes  . Smokeless tobacco: Never Used  . Alcohol use 0.0 oz/week     Comment: 1- every 3-4 months   Objective:   BP (!) 148/90 (BP Location: Right Arm, Patient Position: Sitting, Cuff Size: Normal)   Pulse 60   Temp 98.1 F (36.7 C) (Oral)   Resp 16   Wt 207 lb 9.6 oz (94.2 kg)   LMP  (Approximate) Comment: over 10 years ago  BMI 32.51 kg/m   Physical Exam  Constitutional: She appears well-developed and well-nourished. No distress.  Neck: Normal range of motion. Neck supple. No JVD present. No tracheal deviation present. No thyromegaly present.  Cardiovascular: Normal rate, regular rhythm and normal heart sounds.  Exam reveals no gallop and no friction rub.   No murmur heard. Pulmonary/Chest: Effort normal and breath sounds normal. No respiratory distress. She has no wheezes. She has no rales.  Musculoskeletal: She exhibits no edema.  Lymphadenopathy:    She has no cervical adenopathy.  Skin: She is not diaphoretic.  Vitals reviewed.     Assessment & Plan:     1. Type 2 diabetes mellitus with stage 2 chronic kidney disease, without long-term current use of insulin (HCC) A1c improved to 8.6. Patient would like to switch back to jardiance due to better control. Will prescribe as below. She is to continue metformin as well. Will check other labs as below and will f/u pending results. I will see her back in one month for CPE w pap. - POCT glycosylated hemoglobin (Hb A1C) - empagliflozin (JARDIANCE) 25 MG TABS tablet; Take 25 mg by mouth daily.  Dispense: 90 tablet; Refill: 1 - Comprehensive Metabolic Panel (CMET)  2. Essential hypertension Stable. Continue HCTZ 25mg . Will check labs as below and f/u pending  results. - Comprehensive Metabolic Panel (CMET) - CBC w/Diff/Platelet - TSH  3. Hypercholesterolemia without hypertriglyceridemia Stable. Continue pravastatin 40mg . Will check labs as below and f/u pending results. - Lipid Profile       Mar Daring, PA-C  [facility]

## 2016-09-24 NOTE — Patient Instructions (Signed)
Type 2 Diabetes Mellitus, Adult  Type 2 diabetes mellitus is a long-term (chronic) disease. In type 2 diabetes:  · The pancreas does not make enough of a hormone called insulin.  · The cells in the body do not respond as well to the insulin that is made.  · Both of the above can happen.  Normally, insulin moves sugars from food into tissue cells. This gives you energy. If you have type 2 diabetes, sugars cannot be moved into tissue cells. This causes high blood sugar (hyperglycemia).   Your doctors will set personal treatment goals for you based on your age, your medicines, how long you have had diabetes, and any other medical conditions you have. Generally, the goal of treatment is to maintain the following blood glucose levels:  · Before meals (preprandial): 80-130 mg/dL.  · After meals (postprandial): below 180 mg/dL.  · A1c: less than 6.5-7%.  HOME CARE  · Have your hemoglobin A1c level checked twice a year. The level shows if your diabetes is under control or out of control.  · Test your blood sugar level every day as told by your doctor.  · Check your ketone levels by testing your pee (urine) when you are sick and as told.  · Take your diabetes or insulin medicine as told by your doctor.    Never run out of insulin.    Adjust how much insulin you give yourself based on how many carbs (carbohydrates) you eat. Carbs are in many foods, such as fruits, vegetables, whole grains, and dairy products.  · Have a healthy snack between every healthy meal. Have 3 meals and 3 snacks a day.  · Lose weight if you are overweight.  · Carry a medical alert card or wear your medical alert jewelry.  · Carry a 15-gram carb snack with you at all times. Examples include:    Glucose pills, 3 or 4.    Glucose gel, 15-gram tube.    Raisins, 2 tablespoons (24 grams).    Jelly beans, 6.    Animal crackers, 8.    Regular (not diet) pop, 4 ounces (120 milliliters).    Gummy treats, 9.  · Notice low blood sugar (hypoglycemia) symptoms, such  as:    Shaking (tremors).    Trouble thinking clearly.    Sweating.    Faster heart rate.    Headache.    Dry mouth.    Hunger.    Crabbiness (irritability).    Being worried or tense (anxious).    Restless sleep.    A change in speech or coordination.    Confusion.  · Treat low blood sugar right away. If you are alert and can swallow, follow the 15:15 rule:    Take 15-20 grams of a rapid-acting glucose or carb. This includes glucose gel, glucose pills, or 4 ounces (120 milliliters) of fruit juice, regular pop, or low-fat milk.    Check your blood sugar level 15 minutes after taking the glucose.    Take 15-20 grams more of glucose if the repeat blood sugar level is still 70 mg/dL (milligrams/deciliter) or below.    Eat a meal or snack within 1 hour of the blood sugar levels going back to normal.  · Notice early symptoms of high blood sugar, such as:    Being really thirsty or drinking a lot (polydipsia).    Peeing a lot (polyuria).  · Do at least 150 minutes of physical activity a week or as told.      Split the 150 minutes of activity up during the week. Do not do 150 minutes of activity in one day.    Perform exercises, such as weight lifting, at least 2 times a week or as told.    Spend no more than 90 minutes at one time inactive.  · Adjust your insulin or food intake as needed if you start a new exercise or sport.  · Follow your sick-day plan when you are not able to eat or drink as usual.  · Do not smoke, chew tobacco, or use electronic cigarettes.  · Women who are not pregnant should drink no more than 1 drink a day. Men should drink no more than 2 drinks a day.    Only drink alcohol with food.    Ask your doctor if alcohol is safe for you.    Tell your doctor if you drink alcohol several times during the week.  · See your doctor regularly.  · Schedule an eye exam soon after you are told you have diabetes. Schedule exams once every year.  · Check your skin and feet every day. Check for cuts, bruises, redness,  nail problems, bleeding, blisters, or sores. A doctor should do a foot exam once a year.  · Brush your teeth and gums twice a day. Floss once a day. Visit your dentist regularly.  · Share your diabetes plan with your workplace or school.  · Keep your shots that fight diseases (vaccines) up to date.    Get a flu (influenza) shot every year.    Get a pneumonia shot. If you are 65 years of age or older and you have never gotten a pneumonia shot, you might need to get two shots.    Ask your doctor which other shots you should get.  · Learn how to deal with stress.  · Get diabetes education and support as needed.  · Ask your doctor for special help if:    You need help to maintain or improve how you do things on your own.    You need help to maintain or improve the quality of your life.    You have foot or hand problems.    You have trouble cleaning yourself, dressing, eating, or doing physical activity.  GET HELP IF:  · You are unable to eat or drink for more than 6 hours.  · You feel sick to your stomach (nauseous) or throw up (vomit) for more than 6 hours.  · Your blood sugar level is over 240 mg/dL.  · There is a change in mental status.  · You get another serious illness.  · You have watery poop (diarrhea) for more than 6 hours.  · You have been sick or have had a fever for 2 or more days and are not getting better.  · You have pain when you are active.  GET HELP RIGHT AWAY IF:  · You have trouble breathing.  · Your ketone levels are higher than your doctor says they should be.  MAKE SURE YOU:  · Understand these instructions.  · Will watch your condition.  · Will get help right away if you are not doing well or get worse.     This information is not intended to replace advice given to you by your health care provider. Make sure you discuss any questions you have with your health care provider.     Document Released: 09/08/2008 Document Revised: 04/16/2015 Document Reviewed: 07/01/2012  Elsevier Interactive Patient

## 2016-09-29 ENCOUNTER — Encounter: Payer: Self-pay | Admitting: Physician Assistant

## 2016-10-13 ENCOUNTER — Ambulatory Visit: Payer: Self-pay | Admitting: Physician Assistant

## 2016-10-13 ENCOUNTER — Encounter: Payer: Self-pay | Admitting: Physician Assistant

## 2016-10-13 VITALS — BP 176/100 | HR 76 | Temp 98.1°F

## 2016-10-13 DIAGNOSIS — J069 Acute upper respiratory infection, unspecified: Secondary | ICD-10-CM

## 2016-10-13 MED ORDER — CEFDINIR 300 MG PO CAPS: 300.0000 mg | ORAL_CAPSULE | Freq: Two times a day (BID) | ORAL | 0 refills | Status: DC

## 2016-10-13 MED ORDER — FLUCONAZOLE 150 MG PO TABS: ORAL_TABLET | ORAL | 0 refills | Status: DC

## 2016-10-13 NOTE — Progress Notes (Signed)
S: C/o runny nose and congestion for 3 days, no fever, chills, cp/sob, v/d; mucus was green this am but clear throughout the day, cough is sporadic, forgot to take her bp meds today  Using otc meds: robitussin  O: PE: vitals w elevated bp at 176/100, otherwise wnl; perrl eomi, normocephalic, tms dull, nasal mucosa red and swollen, throat injected, neck supple no lymph, lungs c t a, cv rrr, neuro intact  A:  Acute uri   P: drink fluids, continue regular meds , use otc meds of choice, return if not improving in 5 days, return earlier if worsening , omnicef, diflucan

## 2016-10-26 ENCOUNTER — Other Ambulatory Visit: Payer: Self-pay | Admitting: Physician Assistant

## 2016-10-26 ENCOUNTER — Other Ambulatory Visit: Payer: Self-pay

## 2016-10-26 VITALS — BP 160/80 | Ht 67.0 in | Wt 208.1 lb

## 2016-10-26 DIAGNOSIS — E1165 Type 2 diabetes mellitus with hyperglycemia: Secondary | ICD-10-CM

## 2016-10-26 DIAGNOSIS — Z1231 Encounter for screening mammogram for malignant neoplasm of breast: Secondary | ICD-10-CM

## 2016-10-26 NOTE — Patient Outreach (Signed)
[facility]) Care Management  [place]  10/26/2016   [name] Jul 02, 1956 [id number]  Subjective: Met with patient for the Link to Wellness diabetes programs.  She brought her meter this visit but only checked her blood sugar once since I last saw her- it was 167m/dl.  She denies hypoglycemia, SOB or chest pain.  She does complain of ongoing yeast infection since she started on Jardiance.  She [place] gave her Diflucan but the yeast did not resolve. She is very stressed about her daughter's living condition.   Objective:  Vitals:   10/26/16 1320  BP: (!) 160/80  Weight: 208 lb 1.6 oz (94.4 kg)  Height: 1.702 m (_0 )     Encounter Medications:  Outpatient Encounter Prescriptions as of 10/26/2016  Medication Sig Note  . ALPRAZolam (XANAX) 0.5 MG tablet TAKE ONE TABLET BY MOUTH EVERY 8 HOURS (1/2 TO 1 TABLET BEFORE WORK AND EVERY 8 HOURS AS NEEDED)   . aspirin 81 MG tablet Take 81 mg by mouth daily.   .Marland Kitchenglucose blood test strip Check blood sugar 4 times daily.   . hydrochlorothiazide (HYDRODIURIL) 25 MG tablet Take 1 tablet (25 mg total) by mouth daily.   . hydrOXYzine (ATARAX/VISTARIL) 25 MG tablet Take 1 tablet by mouth 3 (three) times daily as needed. Reported on 06/29/2016 08/01/2015: Medication taken as needed.  Received from: [place] Take by mouth.  . losartan (COZAAR) 25 MG tablet Take 1 tablet (25 mg total) by mouth 2 (two) times daily.   . metFORMIN (GLUCOPHAGE) 1000 MG tablet Take 1 tablet (1,000 mg total) by mouth 2 (two) times daily with a meal.   . montelukast (SINGULAIR) 10 MG tablet Take 1 tablet (10 mg total) by mouth daily.   . Multiple Vitamin (MULTIVITAMIN) tablet Take 1 tablet by mouth daily.   . naproxen sodium (ANAPROX) 220 MG tablet Take 220 mg by mouth as needed. Reported on 05/06/2016   . pravastatin (PRAVACHOL) 40 MG tablet Take 1 tablet (40 mg total) by  mouth daily.   . sertraline (ZOLOFT) 100 MG tablet Take 1 tablet (100 mg total) by mouth daily.   . TRUEPLUS LANCETS 26G MISC 1 Device by Does not apply route 4 (four) times daily -  with meals and at bedtime.   . cefdinir (OMNICEF) 300 MG capsule Take 1 capsule (300 mg total) by mouth 2 (two) times daily. (Patient not taking: Reported on 10/26/2016)   . empagliflozin (JARDIANCE) 25 MG TABS tablet Take 25 mg by mouth daily. (Patient not taking: Reported on 10/26/2016)   . fluconazole (DIFLUCAN) 150 MG tablet Take one now and one in a week (Patient not taking: Reported on 10/26/2016)   . fluticasone (FLONASE) 50 MCG/ACT nasal spray Place 2 sprays into both nostrils daily. (Patient not taking: Reported on 10/26/2016)   . loratadine (CLARITIN) 10 MG tablet Take 1 tablet (10 mg total) by mouth daily. (Patient not taking: Reported on 10/26/2016)   . nystatin cream (MYCOSTATIN) Apply 1 application topically 2 (two) times daily as needed for dry skin. (Patient not taking: Reported on 10/26/2016)   . psyllium (REGULOID) 0.52 G capsule Take 1 capsule by mouth as directed. Reported on 06/29/2016 08/01/2015: Received from: [place] Take by mouth.   No facility-administered encounter medications on file as of 10/26/2016.     Functional Status:  In your present state of health, do you have any  difficulty performing the following activities: 09/21/2016 06/29/2016  Hearing? N N  Vision? N N  Difficulty concentrating or making decisions? N Y  Walking or climbing stairs? N N  Dressing or bathing? N N  Doing errands, shopping? N N  Some recent data might be hidden    Fall/Depression Screening: PHQ 2/9 Scores 10/26/2016 09/21/2016 06/29/2016 05/13/2016 02/11/2016 12/30/2015 09/11/2015  PHQ - 2 Score 6 0 0 2 0 0 2  PHQ- 9 Score 7 - - 3 - - 8    Assessment:   We again discussed the importance of checking blood sugars and a low carb diet.  Eating a lower carb diet will aid in  keeping the yeast from growing. She has completed her dilated eye exam, has her mammogram this week and scheduled cataract surgery at the end of December.  She had her flu vaccine.  She still needs a dental exam.   Plan:  [facility] CM Care Plan Problem Two   Flowsheet Row Most Recent Value  Care Plan Problem Two  Elevated A1C  Role Documenting the Problem Two  Care Management [place] for Problem Two  Active  Interventions for Problem Two Long Term Goal   Discussed the importance of checking BG- 2. reviewed meal planning 3. discussed potential for long term complications  THN Long Term Goal (31-90) days  Patient will lower her A1C - below 8.6% when it is checked again by MD in 3 months.   THN Long Term Goal Start Date  10/26/16     I have taken [name] to the Employee Assistance program where she has scheduled an appointment to speak to a Counsellor.  I have asked her to schedule an appointment with the MD or the PA at [facility] to have her yeast taken care of.   She will register for the [employer] phone app for Link to Wellness in 2018.  Gentry Fitz, RN, BA, MHA, CDE Diabetes Coordinator Inpatient Diabetes Program  [phone] (Team Pager) [phone] ([place]) 10/26/2016 4:38 PM

## 2016-10-29 ENCOUNTER — Ambulatory Visit
Admission: RE | Admit: 2016-10-29 | Discharge: 2016-10-29 | Disposition: A | Discharge status: Home / Self Care | Payer: 59 | Source: Ambulatory Visit | Attending: Physician Assistant | Admitting: Physician Assistant

## 2016-10-29 ENCOUNTER — Telehealth: Payer: Self-pay

## 2016-10-29 DIAGNOSIS — Z1231 Encounter for screening mammogram for malignant neoplasm of breast: Secondary | ICD-10-CM | POA: Diagnosis not present

## 2016-10-29 NOTE — Telephone Encounter (Signed)
Patient was advised. KW 

## 2016-10-29 NOTE — Telephone Encounter (Signed)
-----   Message from Mar Daring, [place] sent at 10/29/2016  2:13 PM EST ----- Normal mammogram. Repeat screening in one year.

## 2016-11-10 ENCOUNTER — Ambulatory Visit (INDEPENDENT_AMBULATORY_CARE_PROVIDER_SITE_OTHER): Payer: 59 | Admitting: Physician Assistant

## 2016-11-10 ENCOUNTER — Encounter: Payer: Self-pay | Admitting: Physician Assistant

## 2016-11-10 VITALS — BP 160/90 | HR 68 | Temp 98.0°F | Resp 16 | Ht 66.0 in | Wt 208.0 lb

## 2016-11-10 DIAGNOSIS — F419 Anxiety disorder, unspecified: Secondary | ICD-10-CM | POA: Diagnosis not present

## 2016-11-10 DIAGNOSIS — Z124 Encounter for screening for malignant neoplasm of cervix: Secondary | ICD-10-CM | POA: Diagnosis not present

## 2016-11-10 DIAGNOSIS — Z1211 Encounter for screening for malignant neoplasm of colon: Secondary | ICD-10-CM | POA: Diagnosis not present

## 2016-11-10 DIAGNOSIS — B373 Candidiasis of vulva and vagina: Secondary | ICD-10-CM | POA: Diagnosis not present

## 2016-11-10 DIAGNOSIS — N182 Chronic kidney disease, stage 2 (mild): Secondary | ICD-10-CM | POA: Diagnosis not present

## 2016-11-10 DIAGNOSIS — E1122 Type 2 diabetes mellitus with diabetic chronic kidney disease: Secondary | ICD-10-CM | POA: Diagnosis not present

## 2016-11-10 DIAGNOSIS — I10 Essential (primary) hypertension: Secondary | ICD-10-CM | POA: Diagnosis not present

## 2016-11-10 DIAGNOSIS — Z0000 Encounter for general adult medical examination without abnormal findings: Secondary | ICD-10-CM

## 2016-11-10 DIAGNOSIS — E7800 Pure hypercholesterolemia, unspecified: Secondary | ICD-10-CM

## 2016-11-10 DIAGNOSIS — B3731 Acute candidiasis of vulva and vagina: Secondary | ICD-10-CM

## 2016-11-10 LAB — IFOBT (OCCULT BLOOD): IFOBT: NEGATIVE

## 2016-11-10 MED ORDER — NYSTATIN-TRIAMCINOLONE 100000-0.1 UNIT/GM-% EX OINT: 1.0000 "application " | TOPICAL_OINTMENT | Freq: Two times a day (BID) | CUTANEOUS | 0 refills | Status: DC

## 2016-11-10 MED ORDER — ALPRAZOLAM 0.5 MG PO TABS: ORAL_TABLET | ORAL | 3 refills | Status: DC

## 2016-11-10 MED ORDER — HYDROCHLOROTHIAZIDE 25 MG PO TABS: 25.0000 mg | ORAL_TABLET | Freq: Every day | ORAL | 1 refills | Status: DC

## 2016-11-10 MED ORDER — GLIPIZIDE ER 10 MG PO TB24: 10.0000 mg | ORAL_TABLET | Freq: Every day | ORAL | 1 refills | Status: DC

## 2016-11-10 NOTE — Progress Notes (Signed)
Patient: [name], Female    DOB: [birth year], 60 y.o.   MRN: [id number] Visit Date: 11/10/2016  Today's Provider: Mar Daring, PA-C   Chief Complaint  Patient presents with  . Annual Exam  . Diabetes   Subjective:    Annual physical exam [name] is a 60 y.o. female who presents today for health maintenance and complete physical. She feels well. She reports exercising active with daily activites. She reports she is sleeping well. 08/29/15 CPE 10/29/16 BI-RADS 1 03/15/14 Pap-neg; HPV-neg -----------------------------------------------------------------   Diabetes Mellitus Type II, Follow-up:   Lab Results  Component Value Date   HGBA1C 8.6 09/24/2016   HGBA1C 8.8 07/07/2016   HGBA1C 10.3 03/31/2016    Last seen for diabetes 1 months ago.  Management since then includes started Jardiance. She reports poor compliance with treatment. Patient reports she D/C Jardiance about a week ago due to possible side effects. She is having side effects. Yeast infection Current symptoms include knee, hip pain and calve pain and have been worsening. Home blood sugar records: fasting range: 130's  Episodes of hypoglycemia? no   Current Insulin Regimen:  Most Recent Eye Exam: UTD Weight trend: stable Prior visit with dietician: no Current diet: in general, a "healthy" diet   Current exercise: housecleaning  Pertinent Labs:    Component Value Date/Time   CHOL 243 (H) 08/06/2015 1238   CHOL 205 (H) 09/06/2013 1518   TRIG 214 (H) 08/06/2015 1238   TRIG 174 09/06/2013 1518   HDL 48 08/06/2015 1238   HDL 53 09/06/2013 1518   LDLCALC 152 (H) 08/06/2015 1238   LDLCALC 117 (H) 09/06/2013 1518   CREATININE 1.12 (H) 02/25/2016 1533   CREATININE 0.80 09/06/2013 1518    Wt Readings from Last 3 Encounters:  11/10/16 208 lb (94.3 kg)  10/26/16 208 lb 1.6 oz (94.4 kg)  09/24/16 207 lb 9.6 oz (94.2 kg)    ------------------------------------------------------------------------    Review of Systems  Constitutional: Positive for fatigue.  HENT: Negative.   Eyes: Positive for pain.  Respiratory: Negative.   Cardiovascular: Negative.   Gastrointestinal: Negative.   Endocrine: Negative.   Genitourinary: Negative.   Musculoskeletal: Positive for arthralgias and myalgias.  Skin: Negative.   Allergic/Immunologic: Negative.   Neurological: Negative.   Hematological: Negative.   Psychiatric/Behavioral: Negative.     Social History      She  reports that she has quit smoking. Her smoking use included Cigarettes. She has a 37.50 pack-year smoking history. She has never used smokeless tobacco. She reports that she drinks alcohol. She reports that she does not use drugs.       Social History   Social History  . Marital status: Widowed    Spouse name: N/A  . Number of children: N/A  . Years of education: N/A   Social History Main Topics  . Smoking status: Former Smoker    Packs/day: 1.50    Years: 25.00    Types: Cigarettes  . Smokeless tobacco: Never Used  . Alcohol use 0.0 oz/week     Comment: 1- every 3-4 months  . Drug use: No  . Sexual activity: Not Asked   Other Topics Concern  . None   Social History Narrative  . None    Past Medical History:  Diagnosis Date  . Anxiety   . Cataract    left  . Depression   . Hyperlipidemia   . Hypertension   . Joint pain  as reported by patient  . Urinary incontinence    as stated by patient     Patient Active Problem List   Diagnosis Date Noted  . Allergic rhinitis 10/28/2015  . Acute stress disorder 08/01/2015  . Anxiety 08/01/2015  . Clinical depression 08/01/2015  . Diabetes ([place]) 08/01/2015  . Diverticulosis of colon 08/01/2015  . Generalized pruritus 08/01/2015  . BP (high blood pressure) 08/01/2015  . Cannot sleep 08/01/2015  . Adiposity 08/01/2015  . Detrusor muscle hypertonia 08/01/2015  .  Hypercholesterolemia without hypertriglyceridemia 08/01/2015  . Female stress incontinence 08/01/2015  . Avitaminosis D 08/01/2015    Past Surgical History:  Procedure Laterality Date  . BREAST EXCISIONAL BIOPSY Right 1999  . BREAST LUMPECTOMY Right 2005   as reported by patient  . CATARACT EXTRACTION  January 2013    Family History        Family Status  Relation Status  . Brother Alive  . Mother Deceased  . Father Deceased  . Paternal Grandmother Deceased  . Paternal Grandfather Deceased  . Maternal Grandmother Deceased  . Maternal Grandfather Deceased  . Daughter Alive  . Son Alive        Her family history includes Alzheimer's disease in her paternal grandmother; Aneurysm in her mother; CAD in her father; Cancer in her father; Colon cancer in her father; Dementia in her paternal grandmother; Diabetes in her father; Healthy in her brother, daughter, and son; Heart failure in her father; Mental illness in her paternal grandfather.     Allergies  Allergen Reactions  . Penicillins      Current Outpatient Prescriptions:  .  ALPRAZolam (XANAX) 0.5 MG tablet, TAKE ONE TABLET BY MOUTH EVERY 8 HOURS (1/2 TO 1 TABLET BEFORE WORK AND EVERY 8 HOURS AS NEEDED), Disp: 30 tablet, Rfl: 1 .  aspirin 81 MG tablet, Take 81 mg by mouth daily., Disp: , Rfl:  .  glucose blood test strip, Check blood sugar 4 times daily., Disp: 100 each, Rfl: 12 .  hydrochlorothiazide (HYDRODIURIL) 25 MG tablet, Take 1 tablet (25 mg total) by mouth daily., Disp: 90 tablet, Rfl: 1 .  loratadine (CLARITIN) 10 MG tablet, Take 1 tablet (10 mg total) by mouth daily., Disp: 90 tablet, Rfl: 1 .  losartan (COZAAR) 25 MG tablet, Take 1 tablet (25 mg total) by mouth 2 (two) times daily., Disp: 180 tablet, Rfl: 1 .  metFORMIN (GLUCOPHAGE) 1000 MG tablet, Take 1 tablet (1,000 mg total) by mouth 2 (two) times daily with a meal., Disp: 180 tablet, Rfl: 1 .  montelukast (SINGULAIR) 10 MG tablet, Take 1 tablet (10 mg total)  by mouth daily., Disp: 90 tablet, Rfl: 1 .  Multiple Vitamin (MULTIVITAMIN) tablet, Take 1 tablet by mouth daily., Disp: , Rfl:  .  naproxen sodium (ANAPROX) 220 MG tablet, Take 220 mg by mouth as needed. Reported on 05/06/2016, Disp: , Rfl:  .  pravastatin (PRAVACHOL) 40 MG tablet, Take 1 tablet (40 mg total) by mouth daily., Disp: 90 tablet, Rfl: 1 .  sertraline (ZOLOFT) 100 MG tablet, Take 1 tablet (100 mg total) by mouth daily., Disp: 90 tablet, Rfl: 3 .  TRUEPLUS LANCETS 26G MISC, 1 Device by Does not apply route 4 (four) times daily -  with meals and at bedtime., Disp: 100 each, Rfl: 12   Patient Care Team: Mar Daring, PA-C as PCP - General (Physician Assistant) Pollyann Glen, RN as [place] Management      Objective:  Vitals: BP (!) 160/90 (BP Location: Left Arm, Patient Position: Sitting, Cuff Size: Large)   Pulse 68   Temp 98 F (36.7 C) (Oral)   Resp 16   Ht 5\' 6"  (1.676 m)   Wt 208 lb (94.3 kg)   LMP  (Approximate) Comment: over 10 years ago  BMI 33.57 kg/m    Physical Exam  Constitutional: She is oriented to person, place, and time. She appears well-developed and well-nourished. No distress.  HENT:  Head: Normocephalic and atraumatic.  Right Ear: Hearing, tympanic membrane, external ear and ear canal normal.  Left Ear: Hearing, tympanic membrane, external ear and ear canal normal.  Nose: Nose normal.  Mouth/Throat: Uvula is midline, oropharynx is clear and moist and mucous membranes are normal. No oropharyngeal exudate.  Eyes: Conjunctivae and EOM are normal. Pupils are equal, round, and reactive to light. Right eye exhibits no discharge. Left eye exhibits no discharge. No scleral icterus.  Neck: Normal range of motion. Neck supple. No JVD present. Carotid bruit is not present. No tracheal deviation present. No thyromegaly present.  Cardiovascular: Normal rate, regular rhythm, normal heart sounds and intact distal pulses.  Exam  reveals no gallop and no friction rub.   No murmur heard. Pulmonary/Chest: Effort normal and breath sounds normal. No respiratory distress. She has no wheezes. She has no rales. She exhibits no tenderness. Right breast exhibits no inverted nipple, no mass, no nipple discharge, no skin change and no tenderness. Left breast exhibits no inverted nipple, no mass, no nipple discharge, no skin change and no tenderness. Breasts are symmetrical.  Abdominal: Soft. Bowel sounds are normal. She exhibits no distension and no mass. There is no tenderness. There is no rebound and no guarding. Hernia confirmed negative in the right inguinal area and confirmed negative in the left inguinal area.  Genitourinary: Rectum normal, vagina normal and uterus normal.    No breast swelling, tenderness, discharge or bleeding. Pelvic exam was performed with patient supine. There is rash on the right labia. There is no tenderness, lesion or injury on the right labia. There is rash on the left labia. There is no tenderness, lesion or injury on the left labia. Cervix exhibits no motion tenderness, no discharge and no friability. Right adnexum displays no mass, no tenderness and no fullness. Left adnexum displays no mass, no tenderness and no fullness. No erythema, tenderness or bleeding in the vagina. No signs of injury around the vagina. No vaginal discharge found.  Musculoskeletal: Normal range of motion. She exhibits no edema or tenderness.  Lymphadenopathy:    She has no cervical adenopathy.       Right: No inguinal adenopathy present.       Left: No inguinal adenopathy present.  Neurological: She is alert and oriented to person, place, and time. She has normal reflexes. No cranial nerve deficit. Coordination normal.  Skin: Skin is warm and dry. No rash noted. She is not diaphoretic.  Psychiatric: She has a normal mood and affect. Her behavior is normal. Judgment and thought content normal.  Vitals reviewed.    Depression  Screen PHQ 2/9 Scores 10/26/2016 09/21/2016 06/29/2016 05/13/2016  PHQ - 2 Score 6 0 0 2  PHQ- 9 Score 7 - - 3      Assessment & Plan:     Routine Health Maintenance and Physical Exam  Exercise Activities and Dietary recommendations Goals    None      Immunization History  Administered Date(s) Administered  . Influenza,inj,Quad PF,36+ Mos 08/29/2015  .  Pneumococcal Polysaccharide-23 11/22/2012    Health Maintenance  Topic Date Due  . FOOT EXAM  11/30/1964  . HIV Screening  11/30/1969  . TETANUS/TDAP  11/30/1973  . COLONOSCOPY  11/30/2004  . ZOSTAVAX  11/30/2014  . INFLUENZA VACCINE  09/13/2017 (Originally 07/14/2016)  . PAP SMEAR  03/15/2017  . HEMOGLOBIN A1C  03/25/2017  . OPHTHALMOLOGY EXAM  09/17/2017  . PNEUMOCOCCAL POLYSACCHARIDE VACCINE (2) 11/22/2017  . MAMMOGRAM  10/29/2018  . Hepatitis C Screening  Completed     Discussed health benefits of physical activity, and encouraged her to engage in regular exercise appropriate for her age and condition.    1. Annual physical exam Physical exam with the exception of the vaginal irritation and lichenification from scratching. I will check labs as below and follow-up pending lab results. - TSH  2. Type 2 diabetes mellitus with stage 2 chronic kidney disease, without long-term current use of insulin ([place]) Patient has had frequent yeast infections secondary to Giardia. I will discontinue chart against and we will switch her to glipizide as below. I will see her back in 3 months to recheck her hemoglobin A1c at that time. - glipiZIDE (GLUCOTROL XL) 10 MG 24 hr tablet; Take 1 tablet (10 mg total) by mouth daily with breakfast.  Dispense: 90 tablet; Refill: 1 - HgB A1c  3. Vaginal yeast infection Internal tissues did not look inflamed and there was no discharge. External tissues however were severely irritated. I will give Mycolog cream as below. Patient is to call if itching does not improve with treatment. -  nystatin-triamcinolone ointment (MYCOLOG); Apply 1 application topically 2 (two) times daily.  Dispense: 60 g; Refill: 0  4. Essential hypertension Stable. Continue hydrochlorothiazide 25 mg daily, Cozaar 25 mg twice daily. I will check labs as below and follow-up pending lab results. - CBC w/Diff/Platelet - Comprehensive Metabolic Panel (CMET) - TSH - hydrochlorothiazide (HYDRODIURIL) 25 MG tablet; Take 1 tablet (25 mg total) by mouth daily.  Dispense: 90 tablet; Refill: 1  5. Hypercholesterolemia without hypertriglyceridemia Stable. Continue pravastatin 40 mg daily. I will check labs as below and follow-up pending lab results. - Comprehensive Metabolic Panel (CMET) - Lipid Profile  6. Cervical cancer screening Pap collected today. Will send as below and f/u pending results. - Pap IG and HPV (high risk) DNA detection  7. Colon cancer screening OC light was negative today in the office. - IFOBT POC (occult bld, rslt in office)  8. Acute anxiety Stable. Diagnosis pulled for medication refill. Continue current medical treatment plan. - ALPRAZolam (XANAX) 0.5 MG tablet; TAKE ONE TABLET BY MOUTH EVERY 8 HOURS (1/2 TO 1 TABLET BEFORE WORK AND EVERY 8 HOURS AS NEEDED)  Dispense: 30 tablet; Refill: 3  --------------------------------------------------------------------    Mar Daring, PA-C  [facility]

## 2016-11-10 NOTE — Patient Instructions (Signed)
Health Maintenance for Postmenopausal Women Introduction Menopause is a normal process in which your reproductive ability comes to an end. This process happens gradually over a span of months to years, usually between the ages of 20 and 50. Menopause is complete when you have missed 12 consecutive menstrual periods. It is important to talk with your health care provider about some of the most common conditions that affect postmenopausal women, such as heart disease, cancer, and bone loss (osteoporosis). Adopting a healthy lifestyle and getting preventive care can help to promote your health and wellness. Those actions can also lower your chances of developing some of these common conditions. What should I know about menopause? During menopause, you may experience a number of symptoms, such as:  Moderate-to-severe hot flashes.  Night sweats.  Decrease in sex drive.  Mood swings.  Headaches.  Tiredness.  Irritability.  Memory problems.  Insomnia. Choosing to treat or not to treat menopausal changes is an individual decision that you make with your health care provider. What should I know about hormone replacement therapy and supplements? Hormone therapy products are effective for treating symptoms that are associated with menopause, such as hot flashes and night sweats. Hormone replacement carries certain risks, especially as you become older. If you are thinking about using estrogen or estrogen with progestin treatments, discuss the benefits and risks with your health care provider. What should I know about heart disease and stroke? Heart disease, heart attack, and stroke become more likely as you age. This may be due, in part, to the hormonal changes that your body experiences during menopause. These can affect how your body processes dietary fats, triglycerides, and cholesterol. Heart attack and stroke are both medical emergencies. There are many things that you can do to help prevent  heart disease and stroke:  Have your blood pressure checked at least every 1-2 years. High blood pressure causes heart disease and increases the risk of stroke.  If you are 49-18 years old, ask your health care provider if you should take aspirin to prevent a heart attack or a stroke.  Do not use any tobacco products, including cigarettes, chewing tobacco, or electronic cigarettes. If you need help quitting, ask your health care provider.  It is important to eat a healthy diet and maintain a healthy weight.  Be sure to include plenty of vegetables, fruits, low-fat dairy products, and lean protein.  Avoid eating foods that are high in solid fats, added sugars, or salt (sodium).  Get regular exercise. This is one of the most important things that you can do for your health.  Try to exercise for at least 150 minutes each week. The type of exercise that you do should increase your heart rate and make you sweat. This is known as moderate-intensity exercise.  Try to do strengthening exercises at least twice each week. Do these in addition to the moderate-intensity exercise.  Know your numbers.Ask your health care provider to check your cholesterol and your blood glucose. Continue to have your blood tested as directed by your health care provider. What should I know about cancer screening? There are several types of cancer. Take the following steps to reduce your risk and to catch any cancer development as early as possible. Breast Cancer  Practice breast self-awareness.  This means understanding how your breasts normally appear and feel.  It also means doing regular breast self-exams. Let your health care provider know about any changes, no matter how small.  If you are 40 or  older, have a clinician do a breast exam (clinical breast exam or CBE) every year. Depending on your age, family history, and medical history, it may be recommended that you also have a yearly breast X-ray  (mammogram).  If you have a family history of breast cancer, talk with your health care provider about genetic screening.  If you are at high risk for breast cancer, talk with your health care provider about having an MRI and a mammogram every year.  Breast cancer (BRCA) gene test is recommended for women who have family members with BRCA-related cancers. Results of the assessment will determine the need for genetic counseling and BRCA1 and for BRCA2 testing. BRCA-related cancers include these types:  Breast. This occurs in males or females.  Ovarian.  Tubal. This may also be called fallopian tube cancer.  Cancer of the abdominal or pelvic lining (peritoneal cancer).  Prostate.  Pancreatic. Cervical, Uterine, and Ovarian Cancer  Your health care provider may recommend that you be screened regularly for cancer of the pelvic organs. These include your ovaries, uterus, and vagina. This screening involves a pelvic exam, which includes checking for microscopic changes to the surface of your cervix (Pap test).  For women ages 21-65, health care providers may recommend a pelvic exam and a Pap test every three years. For women ages 50-65, they may recommend the Pap test and pelvic exam, combined with testing for human papilloma virus (HPV), every five years. Some types of HPV increase your risk of cervical cancer. Testing for HPV may also be done on women of any age who have unclear Pap test results.  Other health care providers may not recommend any screening for nonpregnant women who are considered low risk for pelvic cancer and have no symptoms. Ask your health care provider if a screening pelvic exam is right for you.  If you have had past treatment for cervical cancer or a condition that could lead to cancer, you need Pap tests and screening for cancer for at least 20 years after your treatment. If Pap tests have been discontinued for you, your risk factors (such as having a new sexual  partner) need to be reassessed to determine if you should start having screenings again. Some women have medical problems that increase the chance of getting cervical cancer. In these cases, your health care provider may recommend that you have screening and Pap tests more often.  If you have a family history of uterine cancer or ovarian cancer, talk with your health care provider about genetic screening.  If you have vaginal bleeding after reaching menopause, tell your health care provider.  There are currently no reliable tests available to screen for ovarian cancer. Lung Cancer  Lung cancer screening is recommended for adults 59-37 years old who are at high risk for lung cancer because of a history of smoking. A yearly low-dose CT scan of the lungs is recommended if you:  Currently smoke.  Have a history of at least 30 pack-years of smoking and you currently smoke or have quit within the past 15 years. A pack-year is smoking an average of one pack of cigarettes per day for one year. Yearly screening should:  Continue until it has been 15 years since you quit.  Stop if you develop a health problem that would prevent you from having lung cancer treatment. Colorectal Cancer  This type of cancer can be detected and can often be prevented.  Routine colorectal cancer screening usually begins at age 9 and  continues through age 73.  If you have risk factors for colon cancer, your health care provider may recommend that you be screened at an earlier age.  If you have a family history of colorectal cancer, talk with your health care provider about genetic screening.  Your health care provider may also recommend using home test kits to check for hidden blood in your stool.  A small camera at the end of a tube can be used to examine your colon directly (sigmoidoscopy or colonoscopy). This is done to check for the earliest forms of colorectal cancer.  Direct examination of the colon should be  repeated every 5-10 years until age 39. However, if early forms of precancerous polyps or small growths are found or if you have a family history or genetic risk for colorectal cancer, you may need to be screened more often. Skin Cancer  Check your skin from head to toe regularly.  Monitor any moles. Be sure to tell your health care provider:  About any new moles or changes in moles, especially if there is a change in a mole's shape or color.  If you have a mole that is larger than the size of a pencil eraser.  If any of your family members has a history of skin cancer, especially at a young age, talk with your health care provider about genetic screening.  Always use sunscreen. Apply sunscreen liberally and repeatedly throughout the day.  Whenever you are outside, protect yourself by wearing long sleeves, pants, a wide-brimmed hat, and sunglasses. What should I know about osteoporosis? Osteoporosis is a condition in which bone destruction happens more quickly than new bone creation. After menopause, you may be at an increased risk for osteoporosis. To help prevent osteoporosis or the bone fractures that can happen because of osteoporosis, the following is recommended:  If you are 59-26 years old, get at least 1,000 mg of calcium and at least 600 mg of vitamin D per day.  If you are older than age 1 but younger than age 67, get at least 1,200 mg of calcium and at least 600 mg of vitamin D per day.  If you are older than age 38, get at least 1,200 mg of calcium and at least 800 mg of vitamin D per day. Smoking and excessive alcohol intake increase the risk of osteoporosis. Eat foods that are rich in calcium and vitamin D, and do weight-bearing exercises several times each week as directed by your health care provider. What should I know about how menopause affects my mental health? Depression may occur at any age, but it is more common as you become older. Common symptoms of depression  include:  Low or sad mood.  Changes in sleep patterns.  Changes in appetite or eating patterns.  Feeling an overall lack of motivation or enjoyment of activities that you previously enjoyed.  Frequent crying spells. Talk with your health care provider if you think that you are experiencing depression. What should I know about immunizations? It is important that you get and maintain your immunizations. These include:  Tetanus, diphtheria, and pertussis (Tdap) booster vaccine.  Influenza every year before the flu season begins.  Pneumonia vaccine.  Shingles vaccine. Your health care provider may also recommend other immunizations. This information is not intended to replace advice given to you by your health care provider. Make sure you discuss any questions you have with your health care provider. Document Released: 01/22/2006 Document Revised: 06/19/2016 Document Reviewed: 09/03/2015  2017  Elsevier

## 2016-11-12 LAB — PAP IG AND HPV HIGH-RISK
HPV, high-risk: NEGATIVE
PAP Smear Comment: 0

## 2016-11-13 ENCOUNTER — Other Ambulatory Visit
Admission: RE | Admit: 2016-11-13 | Discharge: 2016-11-13 | Disposition: A | Discharge status: Home / Self Care | Payer: 59 | Source: Ambulatory Visit | Attending: Physician Assistant | Admitting: Physician Assistant

## 2016-11-13 ENCOUNTER — Telehealth: Payer: Self-pay

## 2016-11-13 DIAGNOSIS — N182 Chronic kidney disease, stage 2 (mild): Secondary | ICD-10-CM | POA: Insufficient documentation

## 2016-11-13 DIAGNOSIS — I129 Hypertensive chronic kidney disease with stage 1 through stage 4 chronic kidney disease, or unspecified chronic kidney disease: Secondary | ICD-10-CM | POA: Diagnosis not present

## 2016-11-13 DIAGNOSIS — E1122 Type 2 diabetes mellitus with diabetic chronic kidney disease: Secondary | ICD-10-CM | POA: Insufficient documentation

## 2016-11-13 DIAGNOSIS — E7800 Pure hypercholesterolemia, unspecified: Secondary | ICD-10-CM | POA: Insufficient documentation

## 2016-11-13 DIAGNOSIS — Z0000 Encounter for general adult medical examination without abnormal findings: Secondary | ICD-10-CM | POA: Insufficient documentation

## 2016-11-13 LAB — CBC WITH DIFFERENTIAL/PLATELET
Basophils Absolute: 0.1 10*3/uL (ref 0–0.1)
Basophils Relative: 1 %
Eosinophils Absolute: 0.3 10*3/uL (ref 0–0.7)
Eosinophils Relative: 4 %
HCT: 44.9 % (ref 35.0–47.0)
Hemoglobin: 15.7 g/dL (ref 12.0–16.0)
Lymphocytes Relative: 28 %
Lymphs Abs: 2 10*3/uL (ref 1.0–3.6)
MCH: 28.5 pg (ref 26.0–34.0)
MCHC: 35 g/dL (ref 32.0–36.0)
MCV: 81.4 fL (ref 80.0–100.0)
Monocytes Absolute: 0.6 10*3/uL (ref 0.2–0.9)
Monocytes Relative: 9 %
Neutro Abs: 4.3 10*3/uL (ref 1.4–6.5)
Neutrophils Relative %: 58 %
Platelets: 242 10*3/uL (ref 150–440)
RBC: 5.52 MIL/uL — ABNORMAL HIGH (ref 3.80–5.20)
RDW: 13.7 % (ref 11.5–14.5)
WBC: 7.3 10*3/uL (ref 3.6–11.0)

## 2016-11-13 LAB — COMPREHENSIVE METABOLIC PANEL
ALT: 16 U/L (ref 14–54)
AST: 20 U/L (ref 15–41)
Albumin: 4.7 g/dL (ref 3.5–5.0)
Alkaline Phosphatase: 58 U/L (ref 38–126)
Anion gap: 9 (ref 5–15)
BUN: 16 mg/dL (ref 6–20)
CO2: 31 mmol/L (ref 22–32)
Calcium: 9.4 mg/dL (ref 8.9–10.3)
Chloride: 97 mmol/L — ABNORMAL LOW (ref 101–111)
Creatinine, Ser: 0.81 mg/dL (ref 0.44–1.00)
GFR calc Af Amer: >60 mL/min (ref >60)
GFR calc non Af Amer: >60 mL/min (ref >60)
Glucose, Bld: 151 mg/dL — ABNORMAL HIGH (ref 65–99)
Potassium: 4 mmol/L (ref 3.5–5.1)
Sodium: 137 mmol/L (ref 135–145)
Total Bilirubin: 0.9 mg/dL (ref 0.3–1.2)
Total Protein: 9.1 g/dL — ABNORMAL HIGH (ref 6.5–8.1)

## 2016-11-13 LAB — LIPID PANEL
Cholesterol: 180 mg/dL (ref 0–200)
HDL: 55 mg/dL (ref >40)
LDL Cholesterol: 90 mg/dL (ref 0–99)
Total CHOL/HDL Ratio: 3.3 RATIO
Triglycerides: 173 mg/dL — ABNORMAL HIGH (ref <150)
VLDL: 35 mg/dL (ref 0–40)

## 2016-11-13 LAB — TSH: TSH: 5.825 u[IU]/mL — ABNORMAL HIGH (ref 0.350–4.500)

## 2016-11-13 NOTE — Telephone Encounter (Signed)
Pt advised-aa 

## 2016-11-13 NOTE — Telephone Encounter (Signed)
Left message to call back  

## 2016-11-13 NOTE — Telephone Encounter (Signed)
-----   Message from Mar Daring, [place] sent at 11/12/2016  4:50 PM EST ----- Pap is normal, HPV negative.  Will repeat in 3-5 years.

## 2016-11-14 LAB — HEMOGLOBIN A1C
Hgb A1c MFr Bld: 9.1 % — ABNORMAL HIGH (ref 4.8–5.6)
Mean Plasma Glucose: 214 mg/dL

## 2016-11-16 ENCOUNTER — Telehealth: Payer: Self-pay

## 2016-11-16 NOTE — Telephone Encounter (Signed)
LMTCB  Thanks,  -Joseline 

## 2016-11-16 NOTE — Telephone Encounter (Signed)
Patient advised as directed below.  Thanks,  -Joseline 

## 2016-11-16 NOTE — Telephone Encounter (Signed)
-----   Message from Mar Daring, [place] sent at 11/16/2016  9:09 AM EST ----- Cholesterol levels are stable but triglycerides are still elevated, but improved from last year. Current triglyceride levels are 173. Last year it was 214. This is most closely related with diet. Try to continue to limit fatty foods and carbohydrates. Hemoglobin A1c was up from previous check to 9.1 from 8.6. This is most likely due to use of not taking the chart against due to the yeast infection. Again we will recheck in 3 months as previously discussed. Thyroid level is also borderline elevated. We will recheck this in 3 months when we recheck her A1c. All other labs are stable.

## 2016-11-17 DIAGNOSIS — H2512 Age-related nuclear cataract, left eye: Secondary | ICD-10-CM | POA: Diagnosis not present

## 2016-11-24 ENCOUNTER — Other Ambulatory Visit: Payer: Self-pay

## 2016-11-24 NOTE — Patient Outreach (Signed)
[facility]) Care Management  11/24/2016  [name] 08/17/1956 [id number]   I was reviewing [name]'s chart regarding her recent visit to her MD.  I noted that her A1C has increased to 9.1%.  I have left a message on [name]'s phone to check her blood sugar 2x/day and to come for a follow up appointment with me on Monday, December 18th.  She is to call me with a time that works for her.   Gentry Fitz, RN, BA, [place], [place] Direct Dial:  [phone]  Fax:  [phone] E-mail: [email or web address] [address], [place], [place]  [address]

## 2016-11-25 ENCOUNTER — Encounter: Payer: Self-pay | Admitting: *Deleted

## 2016-12-01 NOTE — H&P (Signed)
See scanned note.

## 2016-12-02 ENCOUNTER — Encounter
Admission: RE | Disposition: A | Discharge status: Home / Self Care | Payer: Self-pay | Source: Ambulatory Visit | Attending: Ophthalmology

## 2016-12-02 ENCOUNTER — Ambulatory Visit: Payer: 59 | Admitting: Certified Registered"

## 2016-12-02 ENCOUNTER — Ambulatory Visit
Admission: RE | Admit: 2016-12-02 | Discharge: 2016-12-02 | Disposition: A | Discharge status: Home / Self Care | Payer: 59 | Source: Ambulatory Visit | Attending: Ophthalmology | Admitting: Ophthalmology

## 2016-12-02 ENCOUNTER — Encounter: Payer: Self-pay | Admitting: *Deleted

## 2016-12-02 DIAGNOSIS — M1990 Unspecified osteoarthritis, unspecified site: Secondary | ICD-10-CM | POA: Insufficient documentation

## 2016-12-02 DIAGNOSIS — F419 Anxiety disorder, unspecified: Secondary | ICD-10-CM | POA: Insufficient documentation

## 2016-12-02 DIAGNOSIS — Z955 Presence of coronary angioplasty implant and graft: Secondary | ICD-10-CM | POA: Insufficient documentation

## 2016-12-02 DIAGNOSIS — E785 Hyperlipidemia, unspecified: Secondary | ICD-10-CM | POA: Diagnosis not present

## 2016-12-02 DIAGNOSIS — F329 Major depressive disorder, single episode, unspecified: Secondary | ICD-10-CM | POA: Insufficient documentation

## 2016-12-02 DIAGNOSIS — I10 Essential (primary) hypertension: Secondary | ICD-10-CM | POA: Diagnosis not present

## 2016-12-02 DIAGNOSIS — H2512 Age-related nuclear cataract, left eye: Secondary | ICD-10-CM | POA: Diagnosis not present

## 2016-12-02 DIAGNOSIS — Z87891 Personal history of nicotine dependence: Secondary | ICD-10-CM | POA: Diagnosis not present

## 2016-12-02 DIAGNOSIS — E1136 Type 2 diabetes mellitus with diabetic cataract: Secondary | ICD-10-CM | POA: Diagnosis not present

## 2016-12-02 DIAGNOSIS — Z7984 Long term (current) use of oral hypoglycemic drugs: Secondary | ICD-10-CM | POA: Diagnosis not present

## 2016-12-02 HISTORY — PX: CATARACT EXTRACTION W/PHACO: SHX586

## 2016-12-02 HISTORY — DX: Type 2 diabetes mellitus without complications: E11.9

## 2016-12-02 HISTORY — DX: Unspecified osteoarthritis, unspecified site: M19.90

## 2016-12-02 LAB — GLUCOSE, CAPILLARY: Glucose-Capillary: 211 mg/dL — ABNORMAL HIGH (ref 65–99)

## 2016-12-02 SURGERY — PHACOEMULSIFICATION, CATARACT, WITH IOL INSERTION
Anesthesia: Monitor Anesthesia Care | Site: Eye | Laterality: Left | Wound class: Clean

## 2016-12-02 MED ORDER — POVIDONE-IODINE 5 % OP SOLN
OPHTHALMIC | Status: DC | PRN
  Administered 2016-12-02: 1 via OPHTHALMIC

## 2016-12-02 MED ORDER — NA CHONDROIT SULF-NA HYALURON 40-17 MG/ML IO SOLN
INTRAOCULAR | Status: DC | PRN
  Administered 2016-12-02: 1 mL via INTRAOCULAR

## 2016-12-02 MED ORDER — CARBACHOL 0.01 % IO SOLN
INTRAOCULAR | Status: DC | PRN
  Administered 2016-12-02: 0.5 mL via INTRAOCULAR

## 2016-12-02 MED ORDER — SODIUM CHLORIDE 0.9 % IV SOLN
INTRAVENOUS | Status: DC
  Administered 2016-12-02: 06:00:00 via INTRAVENOUS

## 2016-12-02 MED ORDER — ALFENTANIL 500 MCG/ML IJ INJ
INJECTION | INTRAMUSCULAR | Status: DC | PRN
  Administered 2016-12-02: 250 ug via INTRAVENOUS
  Administered 2016-12-02 (×2): 500 ug via INTRAVENOUS

## 2016-12-02 MED ORDER — MOXIFLOXACIN HCL 0.5 % OP SOLN
OPHTHALMIC | Status: AC
  Administered 2016-12-02: 06:00:00
  Filled 2016-12-02: qty 3

## 2016-12-02 MED ORDER — LIDOCAINE HCL (PF) 4 % IJ SOLN
INTRAMUSCULAR | Status: AC
  Filled 2016-12-02: qty 5

## 2016-12-02 MED ORDER — ACETYLCHOLINE CHLORIDE 20 MG IO SOLR
INTRAOCULAR | Status: AC
  Filled 2016-12-02: qty 1

## 2016-12-02 MED ORDER — TRYPAN BLUE 0.06 % OP SOLN
OPHTHALMIC | Status: AC
  Filled 2016-12-02: qty 0.5

## 2016-12-02 MED ORDER — MOXIFLOXACIN HCL 0.5 % OP SOLN
OPHTHALMIC | Status: AC
  Filled 2016-12-02: qty 3

## 2016-12-02 MED ORDER — NA CHONDROIT SULF-NA HYALURON 40-17 MG/ML IO SOLN
INTRAOCULAR | Status: AC
  Filled 2016-12-02: qty 1

## 2016-12-02 MED ORDER — HYALURONIDASE HUMAN 150 UNIT/ML IJ SOLN
INTRAMUSCULAR | Status: AC
  Filled 2016-12-02: qty 1

## 2016-12-02 MED ORDER — TETRACAINE HCL 0.5 % OP SOLN
OPHTHALMIC | Status: AC
  Filled 2016-12-02: qty 2

## 2016-12-02 MED ORDER — MOXIFLOXACIN HCL 0.5 % OP SOLN
OPHTHALMIC | Status: DC | PRN
  Administered 2016-12-02 (×2): 1 [drp] via OPHTHALMIC

## 2016-12-02 MED ORDER — TETRACAINE HCL 0.5 % OP SOLN
OPHTHALMIC | Status: DC | PRN
  Administered 2016-12-02: 1 [drp] via OPHTHALMIC

## 2016-12-02 MED ORDER — EPINEPHRINE PF 1 MG/ML IJ SOLN
INTRAMUSCULAR | Status: AC
  Filled 2016-12-02: qty 1

## 2016-12-02 MED ORDER — CEFUROXIME OPHTHALMIC INJECTION 1 MG/0.1 ML
INJECTION | OPHTHALMIC | Status: AC
  Filled 2016-12-02: qty 0.1

## 2016-12-02 MED ORDER — LIDOCAINE HCL (PF) 4 % IJ SOLN
INTRAOCULAR | Status: DC | PRN
  Administered 2016-12-02: 4 mL via OPHTHALMIC

## 2016-12-02 MED ORDER — ACETYLCHOLINE CHLORIDE 20 MG IO SOLR
INTRAOCULAR | Status: DC | PRN
  Administered 2016-12-02: 10 mg via INTRAOCULAR

## 2016-12-02 MED ORDER — EPINEPHRINE PF 1 MG/ML IJ SOLN
INTRAOCULAR | Status: DC | PRN
  Administered 2016-12-02: 08:00:00 via OPHTHALMIC

## 2016-12-02 MED ORDER — BUPIVACAINE HCL (PF) 0.75 % IJ SOLN
INTRAMUSCULAR | Status: AC
  Filled 2016-12-02: qty 10

## 2016-12-02 MED ORDER — CYCLOPENTOLATE HCL 2 % OP SOLN
OPHTHALMIC | Status: AC
  Administered 2016-12-02: 06:00:00
  Filled 2016-12-02: qty 2

## 2016-12-02 MED ORDER — MIDAZOLAM HCL 2 MG/2ML IJ SOLN
INTRAMUSCULAR | Status: AC
  Filled 2016-12-02: qty 2

## 2016-12-02 MED ORDER — PHENYLEPHRINE HCL 10 % OP SOLN
OPHTHALMIC | Status: AC
  Administered 2016-12-02: 06:00:00
  Filled 2016-12-02: qty 5

## 2016-12-02 MED ORDER — MOXIFLOXACIN HCL 0.5 % OP SOLN
1.0000 [drp] | OPHTHALMIC | Status: AC
  Administered 2016-12-02: 1 [drp] via OPHTHALMIC

## 2016-12-02 MED ORDER — LIDOCAINE HCL (PF) 4 % IJ SOLN
INTRAMUSCULAR | Status: DC | PRN
  Administered 2016-12-02: 9 mL via OPHTHALMIC

## 2016-12-02 MED ORDER — MIDAZOLAM HCL 5 MG/5ML IJ SOLN
INTRAMUSCULAR | Status: DC | PRN
  Administered 2016-12-02 (×4): 1 mg via INTRAVENOUS

## 2016-12-02 MED ORDER — POVIDONE-IODINE 5 % OP SOLN
OPHTHALMIC | Status: AC
  Filled 2016-12-02: qty 30

## 2016-12-02 MED ORDER — CYCLOPENTOLATE HCL 2 % OP SOLN
1.0000 [drp] | OPHTHALMIC | Status: AC
Start: 2016-12-02 — End: 2016-12-02
  Administered 2016-12-02: 1 [drp] via OPHTHALMIC

## 2016-12-02 MED ORDER — MIDAZOLAM HCL 2 MG/2ML IJ SOLN
INTRAMUSCULAR | Status: AC
Start: 2016-12-02 — End: 2016-12-02
  Filled 2016-12-02: qty 2

## 2016-12-02 MED ORDER — PHENYLEPHRINE HCL 10 % OP SOLN
1.0000 [drp] | OPHTHALMIC | Status: AC
Start: 2016-12-02 — End: 2016-12-02
  Administered 2016-12-02: 1 [drp] via OPHTHALMIC

## 2016-12-02 SURGICAL SUPPLY — 39 items
CANNULA ANT/CHMB 27G (MISCELLANEOUS) ×1 IMPLANT
CANNULA ANT/CHMB 27GA (MISCELLANEOUS) ×2 IMPLANT
CORD BIP STRL DISP 12FT (MISCELLANEOUS) ×2 IMPLANT
CUP MEDICINE 2OZ PLAST GRAD ST (MISCELLANEOUS) ×2 IMPLANT
DRAPE XRAY CASSETTE 23X24 (DRAPES) ×2 IMPLANT
ERASER HMR WETFIELD 18G (MISCELLANEOUS) ×2 IMPLANT
GLIDE IOL SHEET 35MLX5ML (MISCELLANEOUS) ×1 IMPLANT
GLOVE BIO SURGEON STRL SZ8 (GLOVE) ×2 IMPLANT
GLOVE SURG LX 6.5 MICRO (GLOVE) ×1
GLOVE SURG LX 8.0 MICRO (GLOVE) ×1
GLOVE SURG LX STRL 6.5 MICRO (GLOVE) ×1 IMPLANT
GLOVE SURG LX STRL 8.0 MICRO (GLOVE) ×1 IMPLANT
GOWN STRL REUS W/ TWL LRG LVL3 (GOWN DISPOSABLE) ×1 IMPLANT
GOWN STRL REUS W/ TWL XL LVL3 (GOWN DISPOSABLE) ×1 IMPLANT
GOWN STRL REUS W/TWL LRG LVL3 (GOWN DISPOSABLE) ×2
GOWN STRL REUS W/TWL XL LVL3 (GOWN DISPOSABLE) ×2
LENS IOL +16.5 DIOP 13X5.5X (Intraocular Lens) ×1 IMPLANT
LENS IOL ACRSF IQ ULTRA 19.0 (Intraocular Lens) IMPLANT
LENS IOL ACRYSOF IQ 19.0 (Intraocular Lens) IMPLANT
LENS IOL KELMAN MF 16.5 (Intraocular Lens) IMPLANT
LENS IOL KELMAN MULTIFLEX 16.5 (Intraocular Lens) ×2 IMPLANT
NDL SAFETY ECLIPSE 18X1.5 (NEEDLE) IMPLANT
NEEDLE HYPO 18GX1.5 SHARP (NEEDLE) ×2
PACK CATARACT (MISCELLANEOUS) ×2 IMPLANT
PACK CATARACT DINGLEDEIN LX (MISCELLANEOUS) ×2 IMPLANT
PACK EYE AFTER SURG (MISCELLANEOUS) ×2 IMPLANT
PACK VIT ANT 23G (MISCELLANEOUS) ×1 IMPLANT
SHLD EYE VISITEC  UNIV (MISCELLANEOUS) ×2 IMPLANT
SOL BSS BAG (MISCELLANEOUS) ×2
SOL PREP PVP 2OZ (MISCELLANEOUS) ×2
SOLUTION BSS BAG (MISCELLANEOUS) ×1 IMPLANT
SOLUTION PREP PVP 2OZ (MISCELLANEOUS) ×1 IMPLANT
SUT ETHILON 10 0 CS140 6 (SUTURE) ×3 IMPLANT
SUT SILK 5-0 (SUTURE) ×2 IMPLANT
SYR 3ML LL SCALE MARK (SYRINGE) ×2 IMPLANT
SYR 5ML LL (SYRINGE) ×2 IMPLANT
SYR TB 1ML 27GX1/2 LL (SYRINGE) ×2 IMPLANT
WATER STERILE IRR 250ML POUR (IV SOLUTION) ×2 IMPLANT
WIPE NON LINTING 3.25X3.25 (MISCELLANEOUS) ×2 IMPLANT

## 2016-12-02 NOTE — Transfer of Care (Signed)
Immediate Anesthesia Transfer of Care Note  Patient: [name]  Procedure(s) Performed: Procedure(s) with comments: CATARACT EXTRACTION PHACO AND INTRAOCULAR LENS PLACEMENT (IOC) (Left) - [place] 2:14 AP% 28.4 CDE 59.73 Fluid pack lot # [id number]  Patient Location: PACU  Anesthesia Type:MAC  Level of Consciousness: awake, alert , oriented and patient cooperative  Airway & Oxygen Therapy: Patient Spontanous Breathing  Post-op Assessment: Report given to RN, Post -op Vital signs reviewed and stable and Patient moving all extremities X 4  Post vital signs: Reviewed and stable  Last Vitals:  Vitals:   12/02/16 0854 12/02/16 0856  BP: (!) 177/75 (!) 177/75  Pulse: (!) 56 (!) 59  Resp: 16   Temp: 36.2 C (!) 36 C    Last Pain:  Vitals:   12/02/16 0609  TempSrc: Oral         Complications: No apparent anesthesia complications

## 2016-12-02 NOTE — Anesthesia Postprocedure Evaluation (Signed)
Anesthesia Post Note  Patient: [name]  Procedure(s) Performed: Procedure(s) (LRB): CATARACT EXTRACTION PHACO AND INTRAOCULAR LENS PLACEMENT (IOC) (Left)  Patient location during evaluation: PACU Anesthesia Type: MAC Level of consciousness: awake and alert Pain management: pain level controlled Vital Signs Assessment: post-procedure vital signs reviewed and stable Respiratory status: spontaneous breathing, nonlabored ventilation and respiratory function stable Cardiovascular status: stable and blood pressure returned to baseline Anesthetic complications: no     Last Vitals:  Vitals:   12/02/16 0854 12/02/16 0856  BP: (!) 177/75 (!) 177/75  Pulse: (!) 56 (!) 59  Resp: 16   Temp: 36.2 C (!) 36 C    Last Pain:  Vitals:   12/02/16 0609  TempSrc: Shane Crutch A

## 2016-12-02 NOTE — Op Note (Signed)
Date of Surgery: 12/02/2016 Date of Dictation: 12/02/2016 8:50 AM Pre-operative Diagnosis:  Nuclear Sclerotic Cataract and Posterior Subcapsular Cataract left Eye Post-operative Diagnosis: same Procedure performed: Extra-capsular Cataract Extraction (ECCE) with placement of an anterior chamber intraocular lens (IOL) left Eye IOL:  Implant Name Type Inv. Item Serial No. Manufacturer Lot No. LRB No. Used  LENS IOL ACRYSOF IQ 19.0 - [id number] Intraocular Lens LENS IOL ACRYSOF IQ 19.0 [id number] ALCON  Left 1  LENS IOL DIOP 16.5 - [id number] Intraocular Lens LENS IOL DIOP 16.5 [id number] ALCON   Left 1   Anesthesia: 2% Lidocaine and 4% Marcaine in a 50/50 mixture with 10 unites/ml of Hylenex given as a peribulbar Anesthesiologist: Anesthesiologist: Martha Clan, MD CRNA: Silvana Newness, CRNA; Doreen Salvage, CRNA Complications: none Estimated Blood Loss: less than 1 ml  Description of procedure:  The patient was given anesthesia and sedation via intravenous access. The patient was then prepped and draped in the usual fashion. A 25-gauge needle was bent for initiating the capsulorhexis. A 5-0 silk suture was placed through the conjunctiva superior and inferiorly to serve as bridle sutures. Hemostasis was obtained at the superior limbus using an eraser cautery. A partial thickness groove was made at the anterior surgical limbus with a 64 Beaver blade and this was dissected anteriorly with an Avaya. The anterior chamber was entered at 10 o'clock with a 1.0 mm paracentesis knife and through the lamellar dissection with a 2.6 mm Alcon keratome. Epi-Shugarcaine 0.5 CC [9 cc BSS Plus (Alcon), 3 cc 4% preservative-free lidocaine (Hospira) and 4 cc 1:1000 preservative-free, bisulfite-free epinephrine] was injected into the anterior chamber via the paracentesis tract. Epi-Shugarcaine 0.5 CC [9 cc BSS Plus (Alcon), 3 cc 4% preservative-free lidocaine (Hospira) and 4 cc 1:1000  preservative-free, bisulfite-free epinephrine] was injected into the anterior chamber via the paracentesis tract. DiscoVisc was injected to replace the aqueous and a continuous tear curvilinear capsulorhexis was performed using a bent 25-gauge needle.  Balance salt on a syringe was used to perform hydro-dissection and phacoemulsification was carried out using a divide and conquer technique. Procedure(s) with comments: CATARACT EXTRACTION PHACO AND INTRAOCULAR LENS PLACEMENT (IOC) (Left) - [place] 2:14 AP% 28.4 CDE 59.73 Fluid pack lot # [id number].  During phacoemulsification the zonules broke inferiorly and the lens edge dipped down. Phacoemulsification was completed but a vitrectomy was required. The vitrector was brought to the table and the paracentesis was enlarged with the paracentesis knife to allow introduction of the irrigation cannula. The vitrector was introduced through the 2.6 mm incision and a vitrectromy was carried out using the cut-I/A mode. A Weck Cel was placed at the wound to determine that no vitreous was at the wound. DiscoVisc was then placed in the anterior chamber to fill it.   The 2.6 mm incision was enlarged using the Keratome. A Sheets glide was placed across the chamber after Miostat was placed in the eye. The anterior chamber lens was inserted over the Sheets glide and rotated to make sure that the haptics were in the angle and the iris was not trapped. Miochol was placed in the chamber to bring the pupil down more. The I/A handpiece was used to remove DiscoVisc and the vitrector was used in PI mode to make a peripheral iridectomy at 10 o'clock. Two 10-0 nylon sutures were placed across the small incision and 4 across the larger incision. The knots were rotated and buried. Balance salt was injected into the anterior chamber and the wound was checked for  leaks. None were found. 0.1 CC of Vigamox was injected into the anterior chamber.   The bridal sutures were removed and two drops  of Vigamox were placed on the eye. An eye shield was placed to protect the eye and the patient was discharged to the recovery area in good condition.   Fransheska Willingham MD

## 2016-12-02 NOTE — Discharge Instructions (Signed)
Eye Surgery Discharge Instructions  Expect mild scratchy sensation or mild soreness. DO NOT RUB YOUR EYE!  The day of surgery:  Minimal physical activity, but bed rest is not required  No reading, computer work, or close hand work  No bending, lifting, or straining.  May watch TV  For 24 hours:  No driving, legal decisions, or alcoholic beverages  Safety precautions  Eat anything you prefer: It is better to start with liquids, then soup then solid foods.  _____ Eye patch should be worn until postoperative exam tomorrow.  ____ Solar shield eyeglasses should be worn for comfort in the sunlight/patch while sleeping  Resume all regular medications including aspirin or Coumadin if these were discontinued prior to surgery. You may shower, bathe, shave, or wash your hair. Tylenol may be taken for mild discomfort.  Call your doctor if you experience significant pain, nausea, or vomiting, fever > 101 or other signs of infection. [phone] or [phone] Specific instructions:  Follow-up Information    Khyrin Trevathan, MD Follow up.   Specialty:  Ophthalmology Why:  December 21 at 9:45am Contact information: [address]   [place] [address] [phone]

## 2016-12-02 NOTE — Anesthesia Preprocedure Evaluation (Signed)
Anesthesia Evaluation  Patient identified by MRN, date of birth, ID band Patient awake    Reviewed: Allergy & Precautions, H&P , NPO status , Patient's Chart, lab work & pertinent test results, reviewed documented beta blocker date and time   History of Anesthesia Complications Negative for: history of anesthetic complications  Airway Mallampati: II  TM Distance: >3 FB Neck ROM: full    Dental  (+) Partial Lower, Chipped Permanent bridge on the top:   Pulmonary neg pulmonary ROS, former smoker,    Pulmonary exam normal breath sounds clear to auscultation       Cardiovascular Exercise Tolerance: Good hypertension, On Medications (-) angina(-) CAD, (-) Past MI, (-) Cardiac Stents and (-) CABG Normal cardiovascular exam(-) dysrhythmias (-) Valvular Problems/Murmurs Rhythm:regular Rate:Normal     Neuro/Psych PSYCHIATRIC DISORDERS (Depression) negative neurological ROS     GI/Hepatic negative GI ROS, Neg liver ROS,   Endo/Other  diabetes, Type 2, Oral Hypoglycemic Agents  Renal/GU negative Renal ROS  negative genitourinary   Musculoskeletal   Abdominal   Peds  Hematology negative hematology ROS (+)   Anesthesia Other Findings Past Medical History: No date: Anxiety No date: Arthritis No date: Cataract     Comment: left No date: Depression No date: Diabetes mellitus without complication (HCC) No date: Hyperlipidemia No date: Hypertension No date: Joint pain     Comment: as reported by patient No date: Urinary incontinence     Comment: as stated by patient   Reproductive/Obstetrics negative OB ROS                             Anesthesia Physical Anesthesia Plan  ASA: II  Anesthesia Plan: MAC   Post-op Pain Management:    Induction:   Airway Management Planned:   Additional Equipment:   Intra-op Plan:   Post-operative Plan:   Informed Consent: I have reviewed the patients  History and Physical, chart, labs and discussed the procedure including the risks, benefits and alternatives for the proposed anesthesia with the patient or authorized representative who has indicated his/her understanding and acceptance.   Dental Advisory Given  Plan Discussed with: Anesthesiologist, CRNA and Surgeon  Anesthesia Plan Comments:         Anesthesia Quick Evaluation

## 2016-12-02 NOTE — Interval H&P Note (Signed)
History and Physical Interval Note:  12/02/2016 7:31 AM  [name]  has presented today for surgery, with the diagnosis of NUCLEAR SCLEROTIC CATARACT LEFT EYE  The various methods of treatment have been discussed with the patient and family. After consideration of risks, benefits and other options for treatment, the patient has consented to  Procedure(s) with comments: CATARACT EXTRACTION PHACO AND INTRAOCULAR LENS PLACEMENT (IOC) (Left) - [place] AP% CDE Fluid pack lot # [id number] as a surgical intervention .  The patient's history has been reviewed, patient examined, no change in status, stable for surgery.  I have reviewed the patient's chart and labs.  Questions were answered to the patient's satisfaction.     Jocie Meroney

## 2017-01-12 ENCOUNTER — Ambulatory Visit (INDEPENDENT_AMBULATORY_CARE_PROVIDER_SITE_OTHER): Payer: 59 | Admitting: Physician Assistant

## 2017-01-12 ENCOUNTER — Encounter: Payer: Self-pay | Admitting: Physician Assistant

## 2017-01-12 VITALS — BP 162/80 | HR 76 | Temp 98.2°F | Resp 16 | Wt 213.0 lb

## 2017-01-12 DIAGNOSIS — R059 Cough, unspecified: Secondary | ICD-10-CM

## 2017-01-12 DIAGNOSIS — J0100 Acute maxillary sinusitis, unspecified: Secondary | ICD-10-CM | POA: Diagnosis not present

## 2017-01-12 DIAGNOSIS — R05 Cough: Secondary | ICD-10-CM | POA: Diagnosis not present

## 2017-01-12 MED ORDER — HYDROCODONE-HOMATROPINE 5-1.5 MG/5ML PO SYRP: 5.0000 mL | ORAL_SOLUTION | Freq: Every evening | ORAL | 0 refills | Status: DC | PRN

## 2017-01-12 MED ORDER — DOXYCYCLINE HYCLATE 100 MG PO TABS: 100.0000 mg | ORAL_TABLET | Freq: Two times a day (BID) | ORAL | 0 refills | Status: AC

## 2017-01-12 NOTE — Progress Notes (Signed)
Patient: [name] Female    DOB: 06/29/1956   61 y.o.   MRN: [id number] Visit Date: 01/12/2017  Today's Provider: Trinna Post, PA-C   Chief Complaint  Patient presents with  . URI   Subjective:    URI   This is a new problem. The problem has been gradually worsening. There has been no fever. Associated symptoms include congestion, coughing, rhinorrhea, sinus pain, sneezing, vomiting (Only when coughing so much) and wheezing. Pertinent negatives include no abdominal pain, diarrhea, ear pain, headaches, nausea, neck pain, plugged ear sensation or sore throat. She has tried NSAIDs for the symptoms. The treatment provided mild relief.   Patient is 61 y/o female with 37.5 year smoking history. Gradually feeling ill with above pertinent positives and negatives.    Allergies  Allergen Reactions  . Penicillins Rash    Has patient had a PCN reaction causing immediate rash, facial/tongue/throat swelling, SOB or lightheadedness with hypotension: No Has patient had a PCN reaction causing severe rash involving mucus membranes or skin necrosis: No Has patient had a PCN reaction that required hospitalization No Has patient had a PCN reaction occurring within the last 10 years: No If all of the above answers are "NO", then may proceed with Cephalosporin use.      Current Outpatient Prescriptions:  .  ALPRAZolam (XANAX) 0.5 MG tablet, TAKE ONE TABLET BY MOUTH EVERY 8 HOURS (1/2 TO 1 TABLET BEFORE WORK AND EVERY 8 HOURS AS NEEDED), Disp: 30 tablet, Rfl: 3 .  aspirin 81 MG tablet, Take 81 mg by mouth daily., Disp: , Rfl:  .  glipiZIDE (GLUCOTROL XL) 10 MG 24 hr tablet, Take 1 tablet (10 mg total) by mouth daily with breakfast., Disp: 90 tablet, Rfl: 1 .  hydrochlorothiazide (HYDRODIURIL) 25 MG tablet, Take 1 tablet (25 mg total) by mouth daily., Disp: 90 tablet, Rfl: 1 .  loratadine (CLARITIN) 10 MG tablet, Take 1 tablet (10 mg total) by mouth daily., Disp: 90 tablet, Rfl: 1 .   losartan (COZAAR) 25 MG tablet, Take 1 tablet (25 mg total) by mouth 2 (two) times daily., Disp: 180 tablet, Rfl: 1 .  metFORMIN (GLUCOPHAGE) 1000 MG tablet, Take 1 tablet (1,000 mg total) by mouth 2 (two) times daily with a meal., Disp: 180 tablet, Rfl: 1 .  montelukast (SINGULAIR) 10 MG tablet, Take 1 tablet (10 mg total) by mouth daily. (Patient taking differently: Take 10 mg by mouth every evening. ), Disp: 90 tablet, Rfl: 1 .  Multiple Vitamin (MULTIVITAMIN) tablet, Take 1 tablet by mouth daily., Disp: , Rfl:  .  naproxen sodium (ANAPROX) 220 MG tablet, Take 220 mg by mouth as needed (pain). Reported on 05/06/2016, Disp: , Rfl:  .  nystatin-triamcinolone ointment (MYCOLOG), Apply 1 application topically 2 (two) times daily., Disp: 60 g, Rfl: 0 .  pravastatin (PRAVACHOL) 40 MG tablet, Take 1 tablet (40 mg total) by mouth daily., Disp: 90 tablet, Rfl: 1 .  sertraline (ZOLOFT) 100 MG tablet, Take 1 tablet (100 mg total) by mouth daily., Disp: 90 tablet, Rfl: 3  Review of Systems  Constitutional: Positive for fatigue. Negative for activity change, appetite change, chills, diaphoresis, fever and unexpected weight change.  HENT: Positive for congestion, postnasal drip, rhinorrhea, sinus pain, sinus pressure and sneezing. Negative for ear discharge, ear pain, facial swelling, nosebleeds, sore throat and trouble swallowing.   Eyes: Negative.   Respiratory: Positive for cough, chest tightness, shortness of breath and wheezing. Negative for apnea,  choking and stridor.   Gastrointestinal: Positive for vomiting (Only when coughing so much). Negative for abdominal distention, abdominal pain, anal bleeding, blood in stool, constipation, diarrhea, nausea and rectal pain.  Musculoskeletal: Negative for neck pain and neck stiffness.  Neurological: Positive for light-headedness. Negative for dizziness and headaches.    Social History  Substance Use Topics  . Smoking status: Former Smoker    Packs/day: 1.50      Years: 25.00    Types: Cigarettes  . Smokeless tobacco: Never Used  . Alcohol use 0.0 oz/week     Comment: 1- every 3-4 months   Objective:   BP (!) 162/80 (BP Location: Left Arm, Patient Position: Sitting, Cuff Size: Large)   Pulse 76   Temp 98.2 F (36.8 C) (Oral)   Resp 16   Wt 213 lb (96.6 kg)   SpO2 97%   BMI 34.38 kg/m   Physical Exam  Constitutional: She appears well-developed and well-nourished.  HENT:  Right Ear: External ear normal.  Left Ear: External ear normal.  Mouth/Throat: Oropharynx is clear and moist. No oropharyngeal exudate.  Eyes: Right eye exhibits discharge. Left eye exhibits discharge.  Neck: Neck supple.  Cardiovascular: Normal rate and regular rhythm.   Pulmonary/Chest: Effort normal and breath sounds normal. No respiratory distress. She has no rales.  Lymphadenopathy:    She has no cervical adenopathy.  Skin: Skin is warm and dry.  Psychiatric: She has a normal mood and affect. Her behavior is normal.        Assessment & Plan:     1. Acute non-recurrent maxillary sinusitis  Treat as below.  - doxycycline (VIBRA-TABS) 100 MG tablet; Take 1 tablet (100 mg total) by mouth 2 (two) times daily.  Dispense: 20 tablet; Refill: 0  2. Cough  Treat as below. - HYDROcodone-homatropine (HYCODAN) 5-1.5 MG/5ML syrup; Take 5 mLs by mouth at bedtime as needed for cough.  Dispense: 120 mL; Refill: 0  Return if symptoms worsen or fail to improve.  The entirety of the information documented in the History of Present Illness, Review of Systems and Physical Exam were personally obtained by me. Portions of this information were initially documented by Ashley Royalty, CMA and reviewed by me for thoroughness and accuracy.    Patient Instructions  Sinusitis, Adult Sinusitis is soreness and inflammation of your sinuses. Sinuses are hollow spaces in the bones around your face. They are located:  Around your eyes.  In the middle of your forehead.  Behind  your nose.  In your cheekbones. Your sinuses and nasal passages are lined with a stringy fluid (mucus). Mucus normally drains out of your sinuses. When your nasal tissues get inflamed or swollen, the mucus can get trapped or blocked so air cannot flow through your sinuses. This lets bacteria, viruses, and funguses grow, and that leads to infection. Follow these instructions at home: Medicines  Take, use, or apply over-the-counter and prescription medicines only as told by your doctor. These may include nasal sprays.  If you were prescribed an antibiotic medicine, take it as told by your doctor. Do not stop taking the antibiotic even if you start to feel better. Hydrate and Humidify  Drink enough water to keep your pee (urine) clear or pale yellow.  Use a cool mist humidifier to keep the humidity level in your home above 50%.  Breathe in steam for 10-15 minutes, 3-4 times a day or as told by your doctor. You can do this in the bathroom while a hot  shower is running.  Try not to spend time in cool or dry air. Rest  Rest as much as possible.  Sleep with your head raised (elevated).  Make sure to get enough sleep each night. General instructions  Put a warm, moist washcloth on your face 3-4 times a day or as told by your doctor. This will help with discomfort.  Wash your hands often with soap and water. If there is no soap and water, use hand sanitizer.  Do not smoke. Avoid being around people who are smoking (secondhand smoke).  Keep all follow-up visits as told by your doctor. This is important. Contact a doctor if:  You have a fever.  Your symptoms get worse.  Your symptoms do not get better within 10 days. Get help right away if:  You have a very bad headache.  You cannot stop throwing up (vomiting).  You have pain or swelling around your face or eyes.  You have trouble seeing.  You feel confused.  Your neck is stiff.  You have trouble breathing. This  information is not intended to replace advice given to you by your health care provider. Make sure you discuss any questions you have with your health care provider. Document Released: 05/18/2008 Document Revised: 07/26/2016 Document Reviewed: 09/25/2015 Elsevier Interactive Patient Education  2017 [place]

## 2017-01-12 NOTE — Patient Instructions (Signed)

## 2017-01-28 ENCOUNTER — Ambulatory Visit: Payer: Self-pay | Admitting: Physician Assistant

## 2017-01-28 ENCOUNTER — Encounter: Payer: Self-pay | Admitting: Physician Assistant

## 2017-01-28 VITALS — BP 140/90 | HR 75 | Temp 98.6°F

## 2017-01-28 DIAGNOSIS — J209 Acute bronchitis, unspecified: Secondary | ICD-10-CM

## 2017-01-28 MED ORDER — LEVOFLOXACIN 500 MG PO TABS: 500.0000 mg | ORAL_TABLET | Freq: Every day | ORAL | 0 refills | Status: DC

## 2017-01-28 MED ORDER — BENZONATATE 200 MG PO CAPS: 200.0000 mg | ORAL_CAPSULE | Freq: Three times a day (TID) | ORAL | 0 refills | Status: DC | PRN

## 2017-01-28 MED ORDER — ALBUTEROL SULFATE HFA 108 (90 BASE) MCG/ACT IN AERS: 2.0000 | INHALATION_SPRAY | Freq: Four times a day (QID) | RESPIRATORY_TRACT | 0 refills | Status: DC | PRN

## 2017-01-28 NOTE — Progress Notes (Signed)
S: C/o cough and congestion with wheezing and chest pain, chest is sore from coughing, enies fever, chills, or body aches,  cough is dry and hacking; but has a lot of head congestion that is still green and yellow, took doxy for 10 days without relief; keeping pt awake at night;  denies cardiac type chest pain or sob, v/d, abd pain, has strong cough med but its making her very drowsy Remainder ros neg  O: vitals wnl, nad, tms clear, throat injected, neck supple no lymph, lungs c t a, cv rrr, neuro intact, cough is harsh and deep  A:  Acute bronchitis   P:  rx medication:  levaquin 500mg  qd, albuterol inhaler, tessalon perls,  use otc meds, tylenol or motrin as needed for fever/chills, return if not better in 3 -5 days, return earlier if worsening

## 2017-01-29 ENCOUNTER — Telehealth: Payer: Self-pay

## 2017-01-29 NOTE — Patient Outreach (Signed)
Called [name], There was no answer, left message for her to call back.

## 2017-02-05 ENCOUNTER — Telehealth: Payer: Self-pay

## 2017-02-05 DIAGNOSIS — E1165 Type 2 diabetes mellitus with hyperglycemia: Principal | ICD-10-CM

## 2017-02-05 DIAGNOSIS — IMO0002 Reserved for concepts with insufficient information to code with codable children: Secondary | ICD-10-CM

## 2017-02-05 DIAGNOSIS — E1142 Type 2 diabetes mellitus with diabetic polyneuropathy: Secondary | ICD-10-CM

## 2017-02-05 MED ORDER — GLUCOSE BLOOD VI STRP: ORAL_STRIP | 12 refills | Status: DC

## 2017-02-05 MED ORDER — ACCU-CHEK SOFT TOUCH LANCETS MISC: 12 refills | Status: DC

## 2017-02-05 NOTE — Telephone Encounter (Signed)
Patient called and states through [facility] well program she received a new meter Accu-check Guide and she needs strips and lancets sent in. She has been checking it 2 to 3 times and her sugar has been running high and if it continues she will come in and follow up next week. Please review and cb to patient and let her know when this is done and can leave a message on her voicemail-aa

## 2017-02-05 NOTE — Telephone Encounter (Signed)
Advised patient as below.  

## 2017-02-05 NOTE — Telephone Encounter (Signed)
Sent in strips and lancets to [facility].

## 2017-02-11 ENCOUNTER — Ambulatory Visit: Payer: 59 | Admitting: Physician Assistant

## 2017-02-11 ENCOUNTER — Encounter: Payer: Self-pay | Admitting: Physician Assistant

## 2017-02-11 NOTE — Progress Notes (Deleted)
Patient: [name] Female    DOB: 04/29/1956   61 y.o.   MRN: [id number] Visit Date: 02/11/2017  Today's Provider: Mar Daring, PA-C   No chief complaint on file.  Subjective:    HPI  Diabetes Mellitus Type II, Follow-up:   Lab Results  Component Value Date   HGBA1C 9.1 (H) 11/13/2016   HGBA1C 8.6 09/24/2016   HGBA1C 8.8 07/07/2016    Last seen for diabetes 3 months ago on 11/10/2016. Management since then includes checking A1c and continue current medications. She reports {excellent/good/fair/poor:19665} compliance with treatment. She {ACTION; IS/IS VG:[id number] having side effects. *** Current symptoms include {Symptoms; diabetes:14075} and have been {Desc; course:15616}. Home blood sugar records: {diabetes glucometry results:16657}  Episodes of hypoglycemia? {yes***/no:17258}   Current Insulin Regimen: *** Most Recent Eye Exam: *** Weight trend: {trend:16658} Prior visit with dietician: {yes/no:17258} Current diet: {diet habits:16563} Current exercise: {exercise types:16438}  Pertinent Labs:    Component Value Date/Time   CHOL 180 11/13/2016 1343   CHOL 205 (H) 09/06/2013 1518   TRIG 173 (H) 11/13/2016 1343   TRIG 174 09/06/2013 1518   HDL 55 11/13/2016 1343   HDL 53 09/06/2013 1518   LDLCALC 90 11/13/2016 1343   LDLCALC 117 (H) 09/06/2013 1518   CREATININE 0.81 11/13/2016 1343   CREATININE 0.80 09/06/2013 1518    Wt Readings from Last 3 Encounters:  01/12/17 213 lb (96.6 kg)  11/25/16 206 lb (93.4 kg)  11/10/16 208 lb (94.3 kg)    ------------------------------------------------------------------------   Hypertension, follow-up:  BP Readings from Last 3 Encounters:  01/28/17 140/90  01/12/17 (!) 162/80  12/02/16 (!) 180/71    She was last seen for hypertension 3 months ago on 11/10/2016.  BP at that visit was 180/71. Management changes since that visit include no changes, continue current medications. She reports  {excellent/good/fair/poor:19665} compliance with treatment. She {ACTION; IS/IS VG:[id number] having side effects. *** She {is/is not:9024} exercising. She {is/is not:9024} adherent to low salt diet.   Outside blood pressures are ***. She is experiencing {Symptoms; cardiac:12860}.  Patient denies {Symptoms; cardiac:12860}.   Cardiovascular risk factors include {cv risk factors:510}.  Use of agents associated with hypertension: {bp agents assoc with hypertension:511::"none"}.     Weight trend: {trend:16658} Wt Readings from Last 3 Encounters:  01/12/17 213 lb (96.6 kg)  11/25/16 206 lb (93.4 kg)  11/10/16 208 lb (94.3 kg)    Current diet: {diet habits:16563}  ------------------------------------------------------------------------   Anxiety, Follow-up  She  was last seen for this 3 months ago on 11/10/2016. Changes made at last visit include no changes continue current medications.   She reports {excellent/good/fair/poor:19665} compliance with treatment. She {ACTION; IS/IS VG:[id number] having side effects. ***  She reports {DESC; GOOD/FAIR/POOR:18685} tolerance of treatment. Current symptoms include: {Symptoms; depression:1002} She feels she is {improved/worse/unchanged:3041574} since last visit.  ------------------------------------------------------------------------     Allergies  Allergen Reactions  . Penicillins Rash    Has patient had a PCN reaction causing immediate rash, facial/tongue/throat swelling, SOB or lightheadedness with hypotension: No Has patient had a PCN reaction causing severe rash involving mucus membranes or skin necrosis: No Has patient had a PCN reaction that required hospitalization No Has patient had a PCN reaction occurring within the last 10 years: No If all of the above answers are "NO", then may proceed with Cephalosporin use.      Current Outpatient Prescriptions:  .  albuterol (PROVENTIL HFA;VENTOLIN HFA) 108 (90 Base) MCG/ACT  inhaler, Inhale 2 puffs into  the lungs every 6 (six) hours as needed for wheezing or shortness of breath., Disp: 1 Inhaler, Rfl: 0 .  ALPRAZolam (XANAX) 0.5 MG tablet, TAKE ONE TABLET BY MOUTH EVERY 8 HOURS (1/2 TO 1 TABLET BEFORE WORK AND EVERY 8 HOURS AS NEEDED), Disp: 30 tablet, Rfl: 3 .  aspirin 81 MG tablet, Take 81 mg by mouth daily., Disp: , Rfl:  .  benzonatate (TESSALON) 200 MG capsule, Take 1 capsule (200 mg total) by mouth 3 (three) times daily as needed for cough., Disp: 30 capsule, Rfl: 0 .  glipiZIDE (GLUCOTROL XL) 10 MG 24 hr tablet, Take 1 tablet (10 mg total) by mouth daily with breakfast., Disp: 90 tablet, Rfl: 1 .  glucose blood (ACCU-CHEK GUIDE) test strip, Check blood sugar 2-3 times daily, Disp: 300 each, Rfl: 12 .  hydrochlorothiazide (HYDRODIURIL) 25 MG tablet, Take 1 tablet (25 mg total) by mouth daily., Disp: 90 tablet, Rfl: 1 .  HYDROcodone-homatropine (HYCODAN) 5-1.5 MG/5ML syrup, Take 5 mLs by mouth at bedtime as needed for cough., Disp: 120 mL, Rfl: 0 .  Lancets (ACCU-CHEK SOFT TOUCH) lancets, Check blood sugar 2-3 times daily, Disp: 300 each, Rfl: 12 .  levofloxacin (LEVAQUIN) 500 MG tablet, Take 1 tablet (500 mg total) by mouth daily., Disp: 7 tablet, Rfl: 0 .  loratadine (CLARITIN) 10 MG tablet, Take 1 tablet (10 mg total) by mouth daily., Disp: 90 tablet, Rfl: 1 .  losartan (COZAAR) 25 MG tablet, Take 1 tablet (25 mg total) by mouth 2 (two) times daily., Disp: 180 tablet, Rfl: 1 .  metFORMIN (GLUCOPHAGE) 1000 MG tablet, Take 1 tablet (1,000 mg total) by mouth 2 (two) times daily with a meal., Disp: 180 tablet, Rfl: 1 .  montelukast (SINGULAIR) 10 MG tablet, Take 1 tablet (10 mg total) by mouth daily. (Patient not taking: Reported on 01/28/2017), Disp: 90 tablet, Rfl: 1 .  Multiple Vitamin (MULTIVITAMIN) tablet, Take 1 tablet by mouth daily., Disp: , Rfl:  .  naproxen sodium (ANAPROX) 220 MG tablet, Take 220 mg by mouth as needed (pain). Reported on 05/06/2016, Disp: ,  Rfl:  .  nystatin-triamcinolone ointment (MYCOLOG), Apply 1 application topically 2 (two) times daily. (Patient not taking: Reported on 01/28/2017), Disp: 60 g, Rfl: 0 .  pravastatin (PRAVACHOL) 40 MG tablet, Take 1 tablet (40 mg total) by mouth daily., Disp: 90 tablet, Rfl: 1 .  sertraline (ZOLOFT) 100 MG tablet, Take 1 tablet (100 mg total) by mouth daily., Disp: 90 tablet, Rfl: 3  Review of Systems  Social History  Substance Use Topics  . Smoking status: Former Smoker    Packs/day: 1.50    Years: 25.00    Types: Cigarettes  . Smokeless tobacco: Never Used  . Alcohol use 0.0 oz/week     Comment: 1- every 3-4 months   Objective:   There were no vitals taken for this visit. There were no vitals filed for this visit.   Physical Exam      Assessment & Plan:           Mar Daring, PA-C  [facility]

## 2017-02-24 ENCOUNTER — Encounter: Payer: Self-pay | Admitting: Physician Assistant

## 2017-02-24 ENCOUNTER — Ambulatory Visit: Payer: Self-pay | Admitting: Physician Assistant

## 2017-02-24 VITALS — BP 170/90 | HR 85 | Temp 98.4°F

## 2017-02-24 DIAGNOSIS — W57XXXA Bitten or stung by nonvenomous insect and other nonvenomous arthropods, initial encounter: Secondary | ICD-10-CM

## 2017-02-24 DIAGNOSIS — M1990 Unspecified osteoarthritis, unspecified site: Secondary | ICD-10-CM

## 2017-02-24 MED ORDER — DOXYCYCLINE HYCLATE 100 MG PO TABS: 100.0000 mg | ORAL_TABLET | Freq: Two times a day (BID) | ORAL | 0 refills | Status: DC

## 2017-02-24 MED ORDER — MELOXICAM 15 MG PO TABS: 15.0000 mg | ORAL_TABLET | Freq: Every day | ORAL | 1 refills | Status: DC

## 2017-02-24 NOTE — Progress Notes (Signed)
S: c/o shoulder, hip, and knee pain, chronic on and off for long time, hasn't seen her pcp for sx, ?if could take something other than naproxen for pain, no known injury at this time, just thinks its arthritis, also pulled a tick off the back of her neck, no fever/chills, ?if she got the head of the tick out, no rash  O: vitals wnl, nad, full rom of arms and legs, tender at l shoulder, back of neck has small scab in center, no tick noted, no rash, n/v intact  A: tick bite, multiple joint pain  P: stop naproxen and try mobic, doxy 100mg  bid x 10d

## 2017-02-25 ENCOUNTER — Ambulatory Visit (INDEPENDENT_AMBULATORY_CARE_PROVIDER_SITE_OTHER): Payer: 59 | Admitting: Physician Assistant

## 2017-02-25 ENCOUNTER — Encounter: Payer: Self-pay | Admitting: Physician Assistant

## 2017-02-25 ENCOUNTER — Ambulatory Visit
Admission: RE | Admit: 2017-02-25 | Discharge: 2017-02-25 | Disposition: A | Discharge status: Home / Self Care | Payer: 59 | Source: Ambulatory Visit | Attending: Physician Assistant | Admitting: Physician Assistant

## 2017-02-25 VITALS — BP 150/70 | HR 84 | Temp 98.2°F | Resp 16 | Wt 210.8 lb

## 2017-02-25 DIAGNOSIS — E7800 Pure hypercholesterolemia, unspecified: Secondary | ICD-10-CM

## 2017-02-25 DIAGNOSIS — M25561 Pain in right knee: Secondary | ICD-10-CM | POA: Insufficient documentation

## 2017-02-25 DIAGNOSIS — M25551 Pain in right hip: Secondary | ICD-10-CM | POA: Insufficient documentation

## 2017-02-25 DIAGNOSIS — I739 Peripheral vascular disease, unspecified: Secondary | ICD-10-CM | POA: Insufficient documentation

## 2017-02-25 DIAGNOSIS — J301 Allergic rhinitis due to pollen: Secondary | ICD-10-CM

## 2017-02-25 DIAGNOSIS — G8929 Other chronic pain: Secondary | ICD-10-CM

## 2017-02-25 DIAGNOSIS — F3341 Major depressive disorder, recurrent, in partial remission: Secondary | ICD-10-CM | POA: Diagnosis not present

## 2017-02-25 DIAGNOSIS — M1611 Unilateral primary osteoarthritis, right hip: Secondary | ICD-10-CM | POA: Diagnosis not present

## 2017-02-25 MED ORDER — DICLOFENAC SODIUM 1 % TD GEL: 2.0000 g | Freq: Four times a day (QID) | TRANSDERMAL | 3 refills | Status: DC

## 2017-02-25 MED ORDER — SERTRALINE HCL 100 MG PO TABS: 100.0000 mg | ORAL_TABLET | Freq: Every day | ORAL | 1 refills | Status: DC

## 2017-02-25 MED ORDER — PRAVASTATIN SODIUM 40 MG PO TABS: 40.0000 mg | ORAL_TABLET | Freq: Every day | ORAL | 1 refills | Status: DC

## 2017-02-25 MED ORDER — MONTELUKAST SODIUM 10 MG PO TABS: 10.0000 mg | ORAL_TABLET | Freq: Every day | ORAL | 1 refills | Status: DC
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
## 2017-02-25 NOTE — Patient Instructions (Signed)
Arthritis Arthritis means joint pain. It can also mean joint disease. A joint is a place where bones come together. People who have arthritis may have:  Red joints.  Swollen joints.  Stiff joints.  Warm joints.  A fever.  A feeling of being sick. Follow these instructions at home: Pay attention to any changes in your symptoms. Take these actions to help with your pain and swelling. Medicines   Take over-the-counter and prescription medicines only as told by your doctor.  Do not take aspirin for pain if your doctor says that you may have gout. Activity   Rest your joint if your doctor tells you to.  Avoid activities that make the pain worse.  Exercise your joint regularly as told by your doctor. Try doing exercises like:  Swimming.  Water aerobics.  Biking.  Walking. Joint Care    If your joint is swollen, keep it raised (elevated) if told by your doctor.  If your joint feels stiff in the morning, try taking a warm shower.  If you have diabetes, do not apply heat without asking your doctor.  If told, apply heat to the joint:  Put a towel between the joint and the hot pack or heating pad.  Leave the heat on the area for 20-30 minutes.  If told, apply ice to the joint:  Put ice in a plastic bag.  Place a towel between your skin and the bag.  Leave the ice on for 20 minutes, 2-3 times per day.  Keep all follow-up visits as told by your doctor. Contact a doctor if:  The pain gets worse.  You have a fever. Get help right away if:  You have very bad pain in your joint.  You have swelling in your joint.  Your joint is red.  Many joints become painful and swollen.  You have very bad back pain.  Your leg is very weak.  You cannot control your pee (urine) or poop (stool). This information is not intended to replace advice given to you by your health care provider. Make sure you discuss any questions you have with your health care  provider. Document Released: 02/24/2010 Document Revised: 05/07/2016 Document Reviewed: 02/25/2015 Elsevier Interactive Patient Education  2017 [employer]  

## 2017-03-04 ENCOUNTER — Ambulatory Visit (INDEPENDENT_AMBULATORY_CARE_PROVIDER_SITE_OTHER): Payer: 59 | Admitting: Physician Assistant

## 2017-03-04 ENCOUNTER — Encounter: Payer: Self-pay | Admitting: Physician Assistant

## 2017-03-04 VITALS — BP 132/84 | HR 84 | Temp 98.7°F | Resp 16 | Wt 207.0 lb

## 2017-03-04 DIAGNOSIS — R05 Cough: Secondary | ICD-10-CM | POA: Diagnosis not present

## 2017-03-04 DIAGNOSIS — J069 Acute upper respiratory infection, unspecified: Secondary | ICD-10-CM | POA: Diagnosis not present

## 2017-03-04 DIAGNOSIS — R059 Cough, unspecified: Secondary | ICD-10-CM

## 2017-03-04 DIAGNOSIS — B9789 Other viral agents as the cause of diseases classified elsewhere: Secondary | ICD-10-CM | POA: Diagnosis not present

## 2017-03-04 MED ORDER — HYDROCODONE-HOMATROPINE 5-1.5 MG/5ML PO SYRP: 5.0000 mL | ORAL_SOLUTION | Freq: Every evening | ORAL | 0 refills | Status: DC | PRN

## 2017-03-04 NOTE — Progress Notes (Signed)
[place]  Chief Complaint  Patient presents with  . URI    Started yesterday    Subjective:    Patient ID: [name], female    DOB: February 16, 1956, 61 y.o.   MRN: [id number]  Upper Respiratory Infection: [name] is a 61 y.o. female with a past medical history significant for 37.5 pack year smoking, complaining of symptoms of a URI, possible sinusitis. Symptoms include congestion, cough and swollen glands. Onset of symptoms was 1 days ago, gradually worsening since that time. She also c/o congestion, lightheadedness, low grade fever and nasal congestion for the past 1 day .  She is drinking plenty of fluids. Evaluation to date: none. Treatment to date: antihistamines. The treatment has provided no relief. She is currently on 100 mg doxycycline BID, day 5 of 10 for a tick bite, she was seen at [facility].    Review of Systems  Constitutional: Positive for chills and fatigue. Negative for activity change, appetite change, diaphoresis, fever and unexpected weight change.  HENT: Positive for congestion, postnasal drip, rhinorrhea, sinus pain and sinus pressure. Negative for ear discharge, ear pain, nosebleeds, sore throat, tinnitus and trouble swallowing.   Eyes: Positive for itching.  Respiratory: Positive for cough and wheezing. Negative for apnea, choking, shortness of breath and stridor.   Gastrointestinal: Negative.   Musculoskeletal: Positive for myalgias and neck pain.  Neurological: Positive for light-headedness and headaches. Negative for dizziness.       Objective:   BP 132/84 (BP Location: Left Arm, Patient Position: Sitting, Cuff Size: Large)   Pulse 84   Temp 98.7 F (37.1 C) (Oral)   Resp 16   Wt 207 lb (93.9 kg)   BMI 33.41 kg/m   Patient Active Problem List   Diagnosis Date Noted  . Allergic rhinitis 10/28/2015  . Acute stress disorder 08/01/2015  . Anxiety 08/01/2015  . Clinical depression 08/01/2015  .  Diabetes ([place]) 08/01/2015  . Diverticulosis of colon 08/01/2015  . Generalized pruritus 08/01/2015  . BP (high blood pressure) 08/01/2015  . Cannot sleep 08/01/2015  . Adiposity 08/01/2015  . Detrusor muscle hypertonia 08/01/2015  . Hypercholesterolemia without hypertriglyceridemia 08/01/2015  . Female stress incontinence 08/01/2015  . Avitaminosis D 08/01/2015    Outpatient Encounter Prescriptions as of 03/04/2017  Medication Sig  . ALPRAZolam (XANAX) 0.5 MG tablet TAKE ONE TABLET BY MOUTH EVERY 8 HOURS (1/2 TO 1 TABLET BEFORE WORK AND EVERY 8 HOURS AS NEEDED)  . aspirin 81 MG tablet Take 81 mg by mouth daily.  . diclofenac sodium (VOLTAREN) 1 % GEL Apply 2 g topically 4 (four) times daily.  Marland Kitchen doxycycline (VIBRA-TABS) 100 MG tablet Take 1 tablet (100 mg total) by mouth 2 (two) times daily.  Marland Kitchen glipiZIDE (GLUCOTROL XL) 10 MG 24 hr tablet Take 1 tablet (10 mg total) by mouth daily with breakfast.  . glucose blood (ACCU-CHEK GUIDE) test strip Check blood sugar 2-3 times daily  . hydrochlorothiazide (HYDRODIURIL) 25 MG tablet Take 1 tablet (25 mg total) by mouth daily.  . Lancets (ACCU-CHEK SOFT TOUCH) lancets Check blood sugar 2-3 times daily  . losartan (COZAAR) 25 MG tablet Take 1 tablet (25 mg total) by mouth 2 (two) times daily.  . meloxicam (MOBIC) 15 MG tablet Take 1 tablet (15 mg total) by mouth daily.  . metFORMIN (GLUCOPHAGE) 1000 MG tablet Take 1 tablet (1,000 mg total) by mouth 2 (two) times daily with a meal.  . montelukast (SINGULAIR) 10 MG  tablet Take 1 tablet (10 mg total) by mouth daily.  . Multiple Vitamin (MULTIVITAMIN) tablet Take 1 tablet by mouth daily.  . naproxen sodium (ANAPROX) 220 MG tablet Take 220 mg by mouth as needed (pain). Reported on 05/06/2016  . nystatin-triamcinolone ointment (MYCOLOG) Apply 1 application topically 2 (two) times daily.  . pravastatin (PRAVACHOL) 40 MG tablet Take 1 tablet (40 mg total) by mouth daily.  . sertraline (ZOLOFT) 100 MG tablet  Take 1 tablet (100 mg total) by mouth daily.  Marland Kitchen albuterol (PROVENTIL HFA;VENTOLIN HFA) 108 (90 Base) MCG/ACT inhaler Inhale 2 puffs into the lungs every 6 (six) hours as needed for wheezing or shortness of breath. (Patient not taking: Reported on 02/25/2017)  . benzonatate (TESSALON) 200 MG capsule Take 1 capsule (200 mg total) by mouth 3 (three) times daily as needed for cough. (Patient not taking: Reported on 02/25/2017)  . HYDROcodone-homatropine (HYCODAN) 5-1.5 MG/5ML syrup Take 5 mLs by mouth at bedtime as needed for cough.  . loratadine (CLARITIN) 10 MG tablet Take 1 tablet (10 mg total) by mouth daily. (Patient not taking: Reported on 03/04/2017)  . [DISCONTINUED] HYDROcodone-homatropine (HYCODAN) 5-1.5 MG/5ML syrup Take 5 mLs by mouth at bedtime as needed for cough. (Patient not taking: Reported on 03/04/2017)  . [DISCONTINUED] levofloxacin (LEVAQUIN) 500 MG tablet Take 1 tablet (500 mg total) by mouth daily. (Patient not taking: Reported on 02/24/2017)   No facility-administered encounter medications on file as of 03/04/2017.     Allergies  Allergen Reactions  . Penicillins Rash    Has patient had a PCN reaction causing immediate rash, facial/tongue/throat swelling, SOB or lightheadedness with hypotension: No Has patient had a PCN reaction causing severe rash involving mucus membranes or skin necrosis: No Has patient had a PCN reaction that required hospitalization No Has patient had a PCN reaction occurring within the last 10 years: No If all of the above answers are "NO", then may proceed with Cephalosporin use.        Physical Exam  Constitutional: She is oriented to person, place, and time. She appears well-developed and well-nourished. No distress.  HENT:  Mouth/Throat: Oropharynx is clear and moist. No oropharyngeal exudate.  Eyes: Conjunctivae are normal.  Neck: Neck supple.  Cardiovascular: Normal rate and regular rhythm.   Pulmonary/Chest: Effort normal. No respiratory  distress. She has wheezes. She has no rales.  Scant expiratory wheezing.   Lymphadenopathy:    She has no cervical adenopathy.  Neurological: She is alert and oriented to person, place, and time.  Skin: Skin is warm and dry.  Psychiatric: She has a normal mood and affect. Her behavior is normal.       Assessment & Plan:   1. Viral URI with cough  With length of time, sounds viral. Since patient is already on doxycycline for tick bite, which also covers for respiratory pathogens, will defer further antibiotics and treat symptomatically.   2. Cough  Counseled on sedation.   - HYDROcodone-homatropine (HYCODAN) 5-1.5 MG/5ML syrup; Take 5 mLs by mouth at bedtime as needed for cough.  Dispense: 120 mL; Refill: 0   Recommend rest, fluids, frequent hand washing. Work note provided  Patient Instructions  Upper Respiratory Infection, Adult Most upper respiratory infections (URIs) are caused by a virus. A URI affects the nose, throat, and upper air passages. The most common type of URI is often called "the common cold." Follow these instructions at home:  Take medicines only as told by your doctor.  Gargle warm saltwater or  take cough drops to comfort your throat as told by your doctor.  Use a warm mist humidifier or inhale steam from a shower to increase air moisture. This may make it easier to breathe.  Drink enough fluid to keep your pee (urine) clear or pale yellow.  Eat soups and other clear broths.  Have a healthy diet.  Rest as needed.  Go back to work when your fever is gone or your doctor says it is okay.  You may need to stay home longer to avoid giving your URI to others.  You can also wear a face mask and wash your hands often to prevent spread of the virus.  Use your inhaler more if you have asthma.  Do not use any tobacco products, including cigarettes, chewing tobacco, or electronic cigarettes. If you need help quitting, ask your doctor. Contact a doctor  if:  You are getting worse, not better.  Your symptoms are not helped by medicine.  You have chills.  You are getting more short of breath.  You have brown or red mucus.  You have yellow or brown discharge from your nose.  You have pain in your face, especially when you bend forward.  You have a fever.  You have puffy (swollen) neck glands.  You have pain while swallowing.  You have white areas in the back of your throat. Get help right away if:  You have very bad or constant:  Headache.  Ear pain.  Pain in your forehead, behind your eyes, and over your cheekbones (sinus pain).  Chest pain.  You have long-lasting (chronic) lung disease and any of the following:  Wheezing.  Long-lasting cough.  Coughing up blood.  A change in your usual mucus.  You have a stiff neck.  You have changes in your:  Vision.  Hearing.  Thinking.  Mood. This information is not intended to replace advice given to you by your health care provider. Make sure you discuss any questions you have with your health care provider. Document Released: 05/18/2008 Document Revised: 08/02/2016 Document Reviewed: 03/07/2014 Elsevier Interactive Patient Education  2017 [employer].    Return if symptoms worsen or fail to improve.  The entirety of the information documented in the History of Present Illness, Review of Systems and Physical Exam were personally obtained by me. Portions of this information were initially documented by Ashley Royalty, CMA and reviewed by me for thoroughness and accuracy.

## 2017-03-04 NOTE — Patient Instructions (Signed)
Upper Respiratory Infection, Adult Most upper respiratory infections (URIs) are caused by a virus. A URI affects the nose, throat, and upper air passages. The most common type of URI is often called "the common cold." Follow these instructions at home:  Take medicines only as told by your doctor.  Gargle warm saltwater or take cough drops to comfort your throat as told by your doctor.  Use a warm mist humidifier or inhale steam from a shower to increase air moisture. This may make it easier to breathe.  Drink enough fluid to keep your pee (urine) clear or pale yellow.  Eat soups and other clear broths.  Have a healthy diet.  Rest as needed.  Go back to work when your fever is gone or your doctor says it is okay.  You may need to stay home longer to avoid giving your URI to others.  You can also wear a face mask and wash your hands often to prevent spread of the virus.  Use your inhaler more if you have asthma.  Do not use any tobacco products, including cigarettes, chewing tobacco, or electronic cigarettes. If you need help quitting, ask your doctor. Contact a doctor if:  You are getting worse, not better.  Your symptoms are not helped by medicine.  You have chills.  You are getting more short of breath.  You have brown or red mucus.  You have yellow or brown discharge from your nose.  You have pain in your face, especially when you bend forward.  You have a fever.  You have puffy (swollen) neck glands.  You have pain while swallowing.  You have white areas in the back of your throat. Get help right away if:  You have very bad or constant:  Headache.  Ear pain.  Pain in your forehead, behind your eyes, and over your cheekbones (sinus pain).  Chest pain.  You have long-lasting (chronic) lung disease and any of the following:  Wheezing.  Long-lasting cough.  Coughing up blood.  A change in your usual mucus.  You have a stiff neck.  You have  changes in your:  Vision.  Hearing.  Thinking.  Mood. This information is not intended to replace advice given to you by your health care provider. Make sure you discuss any questions you have with your health care provider. Document Released: 05/18/2008 Document Revised: 08/02/2016 Document Reviewed: 03/07/2014 Elsevier Interactive Patient Education  2017 Elsevier Inc.  

## 2017-03-25 ENCOUNTER — Ambulatory Visit (INDEPENDENT_AMBULATORY_CARE_PROVIDER_SITE_OTHER): Payer: 59 | Admitting: Physician Assistant

## 2017-03-25 ENCOUNTER — Encounter: Payer: Self-pay | Admitting: Physician Assistant

## 2017-03-25 VITALS — BP 130/80 | HR 80 | Temp 97.8°F | Resp 16 | Ht 66.0 in | Wt 203.2 lb

## 2017-03-25 DIAGNOSIS — IMO0002 Reserved for concepts with insufficient information to code with codable children: Secondary | ICD-10-CM

## 2017-03-25 DIAGNOSIS — M2550 Pain in unspecified joint: Secondary | ICD-10-CM | POA: Diagnosis not present

## 2017-03-25 DIAGNOSIS — E1165 Type 2 diabetes mellitus with hyperglycemia: Secondary | ICD-10-CM | POA: Diagnosis not present

## 2017-03-25 DIAGNOSIS — E1142 Type 2 diabetes mellitus with diabetic polyneuropathy: Secondary | ICD-10-CM | POA: Diagnosis not present

## 2017-03-25 LAB — POCT GLYCOSYLATED HEMOGLOBIN (HGB A1C)
Est. average glucose Bld gHb Est-mCnc: 229
Hemoglobin A1C: 9.6

## 2017-03-25 LAB — POCT UA - MICROALBUMIN: Microalbumin Ur, POC: 50 mg/L

## 2017-03-25 MED ORDER — SITAGLIPTIN PHOSPHATE 100 MG PO TABS: 100.0000 mg | ORAL_TABLET | Freq: Every day | ORAL | 0 refills | Status: DC

## 2017-03-25 MED ORDER — MELOXICAM 15 MG PO TABS: 15.0000 mg | ORAL_TABLET | Freq: Every day | ORAL | 1 refills | Status: DC

## 2017-03-25 NOTE — Patient Instructions (Signed)
Sitagliptin oral tablet What is this medicine? SITAGLIPTIN (sit a GLIP tin) helps to treat type 2 diabetes. It helps to control blood sugar. Treatment is combined with diet and exercise. This medicine may be used for other purposes; ask your health care provider or pharmacist if you have questions. COMMON BRAND NAME(S): Januvia What should I tell my health care provider before I take this medicine? They need to know if you have any of these conditions: -diabetic ketoacidosis -kidney disease -pancreatitis -previous swelling of the tongue, face, or lips with difficulty breathing, difficulty swallowing, hoarseness, or tightening of the throat -type 1 diabetes -an unusual or allergic reaction to sitagliptin, other medicines, foods, dyes, or preservatives -pregnant or trying to get pregnant -breast-feeding How should I use this medicine? Take this medicine by mouth with a glass of water. Follow the directions on the prescription label. You can take it with or without food. Do not cut, crush or chew this medicine. Take your dose at the same time each day. Do not take more often than directed. Do not stop taking except on your doctor's advice. A special MedGuide will be given to you by the pharmacist with each prescription and refill. Be sure to read this information carefully each time. Talk to your pediatrician regarding the use of this medicine in children. Special care may be needed. Overdosage: If you think you have taken too much of this medicine contact a poison control center or emergency room at once. NOTE: This medicine is only for you. Do not share this medicine with others. What if I miss a dose? If you miss a dose, take it as soon as you can. If it is almost time for your next dose, take only that dose. Do not take double or extra doses. What may interact with this medicine? Do not take this medicine with any of the following medications: -gatifloxacin This medicine may also interact  with the following medications: -alcohol -digoxin -insulin -sulfonylureas like glimepiride, glipizide, glyburide This list may not describe all possible interactions. Give your health care provider a list of all the medicines, herbs, non-prescription drugs, or dietary supplements you use. Also tell them if you smoke, drink alcohol, or use illegal drugs. Some items may interact with your medicine. What should I watch for while using this medicine? Visit your doctor or health care professional for regular checks on your progress. A test called the HbA1C (A1C) will be monitored. This is a simple blood test. It measures your blood sugar control over the last 2 to 3 months. You will receive this test every 3 to 6 months. Learn how to check your blood sugar. Learn the symptoms of low and high blood sugar and how to manage them. Always carry a quick-source of sugar with you in case you have symptoms of low blood sugar. Examples include hard sugar candy or glucose tablets. Make sure others know that you can choke if you eat or drink when you develop serious symptoms of low blood sugar, such as seizures or unconsciousness. They must get medical help at once. Tell your doctor or health care professional if you have high blood sugar. You might need to change the dose of your medicine. If you are sick or exercising more than usual, you might need to change the dose of your medicine. Do not skip meals. Ask your doctor or health care professional if you should avoid alcohol. Many nonprescription cough and cold products contain sugar or alcohol. These can affect blood sugar.   Wear a medical ID bracelet or chain, and carry a card that describes your disease and details of your medicine and dosage times. What side effects may I notice from receiving this medicine? Side effects that you should report to your doctor or health care professional as soon as possible: -allergic reactions like skin rash, itching or hives,  swelling of the face, lips, or tongue -breathing problems -general ill feeling or flu-like symptoms -joint pain -loss of appetite -redness, blistering, peeling or loosening of the skin, including inside the mouth -signs and symptoms of low blood sugar such as feeling anxious, confusion, dizziness, increased hunger, unusually weak or tired, sweating, shakiness, cold, irritable, headache, blurred vision, fast heartbeat, loss of consciousness -unusual stomach pain or discomfort -vomiting Side effects that usually do not require medical attention (report to your doctor or health care professional if they continue or are bothersome): -diarrhea -headache -sore throat -stomach upset -stuffy or runny nose This list may not describe all possible side effects. Call your doctor for medical advice about side effects. You may report side effects to FDA at 1-800-FDA-1088. Where should I keep my medicine? Keep out of the reach of children. Store at room temperature between 15 and 30 degrees C (59 and 86 degrees F). Throw away any unused medicine after the expiration date. NOTE: This sheet is a summary. It may not cover all possible information. If you have questions about this medicine, talk to your doctor, pharmacist, or health care provider.  2018 Elsevier/Gold Standard (2016-05-29 14:23:55)  

## 2017-03-25 NOTE — Progress Notes (Signed)
Patient: [name] Female    DOB: Jan 30, 1956   61 y.o.   MRN: [id number] Visit Date: 03/25/2017  Today's Provider: Mar Daring, PA-C   Chief Complaint  Patient presents with  . Diabetes  . Follow-up   Subjective:    HPI  Diabetes Mellitus Type II, Follow-up:   Lab Results  Component Value Date   HGBA1C 9.6 03/25/2017   HGBA1C 9.1 (H) 11/13/2016   HGBA1C 8.6 09/24/2016    Last seen for diabetes 4 months ago.  Management since then includes no changes. She reports excellent compliance with treatment. She is not having side effects.  Current symptoms include none and have been stable. Home blood sugar records: fasting range: 160  Episodes of hypoglycemia? yes - 70's   Current Insulin Regimen: none Most Recent Eye Exam: UTD Weight trend: decreasing steadily Prior visit with dietician: no Current diet: in general, a "healthy" diet   Current exercise: housecleaning and walking  Pertinent Labs:    Component Value Date/Time   CHOL 180 11/13/2016 1343   CHOL 205 (H) 09/06/2013 1518   TRIG 173 (H) 11/13/2016 1343   TRIG 174 09/06/2013 1518   HDL 55 11/13/2016 1343   HDL 53 09/06/2013 1518   LDLCALC 90 11/13/2016 1343   LDLCALC 117 (H) 09/06/2013 1518   CREATININE 0.81 11/13/2016 1343   CREATININE 0.80 09/06/2013 1518    Wt Readings from Last 3 Encounters:  03/25/17 203 lb 3.2 oz (92.2 kg)  03/04/17 207 lb (93.9 kg)  02/25/17 210 lb 12.8 oz (95.6 kg)  ------------------------------------------------------------------------   Follow up for   The patient was last seen for this 4 weeks ago. Changes made at last visit include starting Meloxicam and Diclofenac.  She reports excellent compliance with treatment. She feels that condition is Unchanged. She is not having side effects.  ------------------------------------------------------------------------------------     Allergies  Allergen Reactions  . Penicillins Rash    Has patient  had a PCN reaction causing immediate rash, facial/tongue/throat swelling, SOB or lightheadedness with hypotension: No Has patient had a PCN reaction causing severe rash involving mucus membranes or skin necrosis: No Has patient had a PCN reaction that required hospitalization No Has patient had a PCN reaction occurring within the last 10 years: No If all of the above answers are "NO", then may proceed with Cephalosporin use.      Current Outpatient Prescriptions:  .  ALPRAZolam (XANAX) 0.5 MG tablet, TAKE ONE TABLET BY MOUTH EVERY 8 HOURS (1/2 TO 1 TABLET BEFORE WORK AND EVERY 8 HOURS AS NEEDED), Disp: 30 tablet, Rfl: 3 .  aspirin 81 MG tablet, Take 81 mg by mouth daily., Disp: , Rfl:  .  Cholecalciferol (VITAMIN D) 2000 units CAPS, Take 1 capsule by mouth daily., Disp: , Rfl:  .  diclofenac sodium (VOLTAREN) 1 % GEL, Apply 2 g topically 4 (four) times daily., Disp: 100 g, Rfl: 3 .  glipiZIDE (GLUCOTROL XL) 10 MG 24 hr tablet, Take 1 tablet (10 mg total) by mouth daily with breakfast., Disp: 90 tablet, Rfl: 1 .  glucose blood (ACCU-CHEK GUIDE) test strip, Check blood sugar 2-3 times daily, Disp: 300 each, Rfl: 12 .  hydrochlorothiazide (HYDRODIURIL) 25 MG tablet, Take 1 tablet (25 mg total) by mouth daily., Disp: 90 tablet, Rfl: 1 .  Lancets (ACCU-CHEK SOFT TOUCH) lancets, Check blood sugar 2-3 times daily, Disp: 300 each, Rfl: 12 .  losartan (COZAAR) 25 MG tablet, Take 1 tablet (25  mg total) by mouth 2 (two) times daily., Disp: 180 tablet, Rfl: 1 .  meloxicam (MOBIC) 15 MG tablet, Take 1 tablet (15 mg total) by mouth daily., Disp: 30 tablet, Rfl: 1 .  metFORMIN (GLUCOPHAGE) 1000 MG tablet, Take 1 tablet (1,000 mg total) by mouth 2 (two) times daily with a meal., Disp: 180 tablet, Rfl: 1 .  Multiple Vitamin (MULTIVITAMIN) tablet, Take 1 tablet by mouth daily., Disp: , Rfl:  .  naproxen sodium (ANAPROX) 220 MG tablet, Take 220 mg by mouth as needed (pain). Reported on 05/06/2016, Disp: , Rfl:  .   pravastatin (PRAVACHOL) 40 MG tablet, Take 1 tablet (40 mg total) by mouth daily., Disp: 90 tablet, Rfl: 1 .  sertraline (ZOLOFT) 100 MG tablet, Take 1 tablet (100 mg total) by mouth daily., Disp: 90 tablet, Rfl: 1 .  loratadine (CLARITIN) 10 MG tablet, Take 1 tablet (10 mg total) by mouth daily. (Patient not taking: Reported on 03/25/2017), Disp: 90 tablet, Rfl: 1  Review of Systems  Constitutional: Negative.   Cardiovascular: Negative.   Endocrine: Negative.   Musculoskeletal: Positive for arthralgias.    Social History  Substance Use Topics  . Smoking status: Former Smoker    Packs/day: 1.50    Years: 25.00    Types: Cigarettes  . Smokeless tobacco: Never Used  . Alcohol use 0.0 oz/week     Comment: 1- every 3-4 months   Objective:   BP 130/80 (BP Location: Left Arm, Patient Position: Sitting, Cuff Size: Large)   Pulse 80   Temp 97.8 F (36.6 C) (Oral)   Resp 16   Ht 5\' 6"  (1.676 m)   Wt 203 lb 3.2 oz (92.2 kg)   BMI 32.80 kg/m  Vitals:   03/25/17 1618  BP: 130/80  Pulse: 80  Resp: 16  Temp: 97.8 F (36.6 C)  TempSrc: Oral  Weight: 203 lb 3.2 oz (92.2 kg)  Height: 5\' 6"  (1.676 m)     Physical Exam  Constitutional: She appears well-developed and well-nourished. No distress.  Neck: Normal range of motion. Neck supple. No tracheal deviation present. No thyromegaly present.  Cardiovascular: Normal rate, regular rhythm and normal heart sounds.  Exam reveals no gallop and no friction rub.   No murmur heard. Pulmonary/Chest: Effort normal and breath sounds normal. No respiratory distress. She has no wheezes. She has no rales.  Lymphadenopathy:    She has no cervical adenopathy.  Skin: She is not diaphoretic.  Vitals reviewed.     Assessment & Plan:     1. Uncontrolled type 2 diabetes mellitus with diabetic polyneuropathy, without long-term current use of insulin (HCC) A1c increased to 9.6 with stopping Jardiance due to mycotic infections. Will add Januvia as  below. I have given her 1 month supply. She is to call if she tolerates and I will send in Rx. I will see her back in 3 months to recehck A1c.   - POCT glycosylated hemoglobin (Hb A1C) - POCT UA - Microalbumin - sitaGLIPtin (JANUVIA) 100 MG tablet; Take 1 tablet (100 mg total) by mouth daily.  Dispense: 28 tablet; Refill: 0  2. Arthralgia, unspecified joint Unchanged. Right knee continues to bother her the most. Will continue meloxicam for now as I do not want her to have steroids until her sugar is better controlled and she does not want surgery at this time. If knee worsens may consider ortho referral if she agrees.  - meloxicam (MOBIC) 15 MG tablet; Take 1 tablet (15 mg total)  by mouth daily.  Dispense: 30 tablet; Refill: [place], PA-C  [facility]

## 2017-04-21 ENCOUNTER — Ambulatory Visit: Payer: Self-pay | Admitting: Family

## 2017-04-21 VITALS — BP 150/90 | HR 82 | Temp 98.8°F

## 2017-04-21 DIAGNOSIS — J019 Acute sinusitis, unspecified: Secondary | ICD-10-CM

## 2017-04-21 MED ORDER — FLUTICASONE PROPIONATE 50 MCG/ACT NA SUSP: 2.0000 | Freq: Every day | NASAL | 11 refills | Status: DC

## 2017-04-21 MED ORDER — MONTELUKAST SODIUM 10 MG PO TABS: 10.0000 mg | ORAL_TABLET | Freq: Every day | ORAL | 11 refills | Status: DC

## 2017-04-21 MED ORDER — AZITHROMYCIN 250 MG PO TABS: ORAL_TABLET | ORAL | 0 refills | Status: DC

## 2017-04-21 NOTE — Progress Notes (Signed)
S perenial allergies , ran out of meds, , now with acute sxs , chilled , malaise blowing clear and yellow  O/ ENT tms dull, with fluid, nasal mucosa red, turbinated boggy , non patent nare, pharynx injected neck supple heart rsr lungs clear   A/ acute rhinosinusitis  P Zpack, flonase, singulair refilled.  allergy tips reviewed and encouraged  Nasal saline products bid and prn

## 2017-06-23 ENCOUNTER — Other Ambulatory Visit: Payer: Self-pay | Admitting: Physician Assistant

## 2017-06-23 DIAGNOSIS — F419 Anxiety disorder, unspecified: Secondary | ICD-10-CM

## 2017-06-24 ENCOUNTER — Ambulatory Visit: Payer: 59 | Admitting: Physician Assistant

## 2017-07-06 DIAGNOSIS — H35352 Cystoid macular degeneration, left eye: Secondary | ICD-10-CM | POA: Diagnosis not present

## 2017-07-15 ENCOUNTER — Ambulatory Visit (INDEPENDENT_AMBULATORY_CARE_PROVIDER_SITE_OTHER): Payer: 59 | Admitting: Physician Assistant

## 2017-07-15 ENCOUNTER — Encounter: Payer: Self-pay | Admitting: Physician Assistant

## 2017-07-15 VITALS — BP 150/92 | HR 68 | Temp 98.2°F | Resp 16 | Ht 66.0 in | Wt 203.0 lb

## 2017-07-15 DIAGNOSIS — E1165 Type 2 diabetes mellitus with hyperglycemia: Secondary | ICD-10-CM

## 2017-07-15 DIAGNOSIS — IMO0002 Reserved for concepts with insufficient information to code with codable children: Secondary | ICD-10-CM

## 2017-07-15 DIAGNOSIS — H35352 Cystoid macular degeneration, left eye: Secondary | ICD-10-CM | POA: Diagnosis not present

## 2017-07-15 DIAGNOSIS — E1142 Type 2 diabetes mellitus with diabetic polyneuropathy: Secondary | ICD-10-CM

## 2017-07-15 LAB — POCT GLYCOSYLATED HEMOGLOBIN (HGB A1C): Hemoglobin A1C: 8.5

## 2017-07-15 NOTE — Progress Notes (Signed)
Patient: [name] Female    DOB: 1956-02-17   61 y.o.   MRN: [id number] Visit Date: 07/15/2017  Today's Provider: Mar Daring, PA-C   Chief Complaint  Patient presents with  . Diabetes   Subjective:    HPI  Diabetes Mellitus Type II, Follow-up:   Lab Results  Component Value Date   HGBA1C 8.5 07/15/2017   HGBA1C 9.6 03/25/2017   HGBA1C 9.1 (H) 11/13/2016    Last seen for diabetes 3 months ago.  Management since then includes start Januvia. Patient reports that she has not started Januvia due to not being able to check her blood sugars.  She reports fair compliance with treatment. She is not having side effects.  Current symptoms include none and have been stable. Home blood sugar records: not being checked  Episodes of hypoglycemia? no   Current Insulin Regimen: Weight trend: stable Prior visit with dietician: no Current diet: in general, a "healthy" diet   Current exercise: walking  Pertinent Labs:    Component Value Date/Time   CHOL 180 11/13/2016 1343   CHOL 205 (H) 09/06/2013 1518   TRIG 173 (H) 11/13/2016 1343   TRIG 174 09/06/2013 1518   HDL 55 11/13/2016 1343   HDL 53 09/06/2013 1518   LDLCALC 90 11/13/2016 1343   LDLCALC 117 (H) 09/06/2013 1518   CREATININE 0.81 11/13/2016 1343   CREATININE 0.80 09/06/2013 1518    Wt Readings from Last 3 Encounters:  07/15/17 203 lb (92.1 kg)  03/25/17 203 lb 3.2 oz (92.2 kg)  03/04/17 207 lb (93.9 kg)   ------------------------------------------------------------------------    Allergies  Allergen Reactions  . Jardiance [Empagliflozin] Itching    Recurrent mycotic infections  . Penicillins Rash    Has patient had a PCN reaction causing immediate rash, facial/tongue/throat swelling, SOB or lightheadedness with hypotension: No Has patient had a PCN reaction causing severe rash involving mucus membranes or skin necrosis: No Has patient had a PCN reaction that required hospitalization  No Has patient had a PCN reaction occurring within the last 10 years: No If all of the above answers are "NO", then may proceed with Cephalosporin use.      Current Outpatient Prescriptions:  .  ALPRAZolam (XANAX) 0.5 MG tablet, TAKE 1/2 TO 1 TABLET BY MOUTH BEFORE WORK AND 1 TABLET EVERY 8 HOURS AS NEEDED, Disp: 30 tablet, Rfl: 5 .  aspirin 81 MG tablet, Take 81 mg by mouth daily., Disp: , Rfl:  .  azithromycin (ZITHROMAX Z-PAK) 250 MG tablet, As directed, Disp: 6 each, Rfl: 0 .  Cholecalciferol (VITAMIN D) 2000 units CAPS, Take 1 capsule by mouth daily., Disp: , Rfl:  .  diclofenac sodium (VOLTAREN) 1 % GEL, Apply 2 g topically 4 (four) times daily., Disp: 100 g, Rfl: 3 .  fluticasone (FLONASE) 50 MCG/ACT nasal spray, Place 2 sprays into both nostrils daily. For allergies, Disp: 16 g, Rfl: 11 .  glipiZIDE (GLUCOTROL XL) 10 MG 24 hr tablet, Take 1 tablet (10 mg total) by mouth daily with breakfast., Disp: 90 tablet, Rfl: 1 .  glucose blood (ACCU-CHEK GUIDE) test strip, Check blood sugar 2-3 times daily, Disp: 300 each, Rfl: 12 .  hydrochlorothiazide (HYDRODIURIL) 25 MG tablet, Take 1 tablet (25 mg total) by mouth daily., Disp: 90 tablet, Rfl: 1 .  Lancets (ACCU-CHEK SOFT TOUCH) lancets, Check blood sugar 2-3 times daily, Disp: 300 each, Rfl: 12 .  loratadine (CLARITIN) 10 MG tablet, Take 1 tablet (10  mg total) by mouth daily., Disp: 90 tablet, Rfl: 1 .  losartan (COZAAR) 25 MG tablet, Take 1 tablet (25 mg total) by mouth 2 (two) times daily., Disp: 180 tablet, Rfl: 1 .  meloxicam (MOBIC) 15 MG tablet, Take 1 tablet (15 mg total) by mouth daily., Disp: 30 tablet, Rfl: 1 .  metFORMIN (GLUCOPHAGE) 1000 MG tablet, Take 1 tablet (1,000 mg total) by mouth 2 (two) times daily with a meal., Disp: 180 tablet, Rfl: 1 .  montelukast (SINGULAIR) 10 MG tablet, Take 1 tablet (10 mg total) by mouth at bedtime. For allergies, Disp: 30 tablet, Rfl: 11 .  Multiple Vitamin (MULTIVITAMIN) tablet, Take 1 tablet  by mouth daily., Disp: , Rfl:  .  naproxen sodium (ANAPROX) 220 MG tablet, Take 220 mg by mouth as needed (pain). Reported on 05/06/2016, Disp: , Rfl:  .  pravastatin (PRAVACHOL) 40 MG tablet, Take 1 tablet (40 mg total) by mouth daily., Disp: 90 tablet, Rfl: 1 .  sertraline (ZOLOFT) 100 MG tablet, Take 1 tablet (100 mg total) by mouth daily., Disp: 90 tablet, Rfl: 1 .  sitaGLIPtin (JANUVIA) 100 MG tablet, Take 1 tablet (100 mg total) by mouth daily. (Patient not taking: Reported on 07/15/2017), Disp: 28 tablet, Rfl: 0  Review of Systems  Constitutional: Negative.   HENT: Negative.   Respiratory: Negative.   Cardiovascular: Negative.   Endocrine: Negative.     Social History  Substance Use Topics  . Smoking status: Former Smoker    Packs/day: 1.50    Years: 25.00    Types: Cigarettes  . Smokeless tobacco: Never Used  . Alcohol use 0.0 oz/week     Comment: 1- every 3-4 months   Objective:   BP (!) 150/92 (BP Location: Left Arm, Patient Position: Sitting, Cuff Size: Large)   Pulse 68   Temp 98.2 F (36.8 C) (Oral)   Resp 16   Ht 5\' 6"  (1.676 m)   Wt 203 lb (92.1 kg)   SpO2 97%   BMI 32.77 kg/m  Vitals:   07/15/17 1144  BP: (!) 150/92  Pulse: 68  Resp: 16  Temp: 98.2 F (36.8 C)  TempSrc: Oral  SpO2: 97%  Weight: 203 lb (92.1 kg)  Height: 5\' 6"  (1.676 m)     Physical Exam  Constitutional: She appears well-developed and well-nourished. No distress.  Neck: Normal range of motion. Neck supple. No JVD present. No tracheal deviation present. No thyromegaly present.  Cardiovascular: Normal rate, regular rhythm and normal heart sounds.  Exam reveals no gallop and no friction rub.   No murmur heard. Pulmonary/Chest: Effort normal and breath sounds normal. No respiratory distress. She has no wheezes. She has no rales.  Musculoskeletal: She exhibits no edema.  Lymphadenopathy:    She has no cervical adenopathy.  Skin: She is not diaphoretic.  Vitals reviewed.       Assessment & Plan:     1. Uncontrolled type 2 diabetes mellitus with diabetic polyneuropathy, without long-term current use of insulin (HCC) A1c improved to 8.5 with glipizide 10mg  and metformin 1000mg  BID. She had not been taking Januvia. This is only because she had trouble getting a glucose meter. She now has it and is willing to start Januvia for better control. Samples were given to patient today in the office. I will see her back in 3 months to recheck her A1c.  - POCT glycosylated hemoglobin (Hb A1C)       Mar Daring, PA-C  [facility]

## 2017-07-15 NOTE — Patient Instructions (Signed)
Sitagliptin oral tablet What is this medicine? SITAGLIPTIN (sit a GLIP tin) helps to treat type 2 diabetes. It helps to control blood sugar. Treatment is combined with diet and exercise. This medicine may be used for other purposes; ask your health care provider or pharmacist if you have questions. COMMON BRAND NAME(S): Januvia What should I tell my health care provider before I take this medicine? They need to know if you have any of these conditions: -diabetic ketoacidosis -kidney disease -pancreatitis -previous swelling of the tongue, face, or lips with difficulty breathing, difficulty swallowing, hoarseness, or tightening of the throat -type 1 diabetes -an unusual or allergic reaction to sitagliptin, other medicines, foods, dyes, or preservatives -pregnant or trying to get pregnant -breast-feeding How should I use this medicine? Take this medicine by mouth with a glass of water. Follow the directions on the prescription label. You can take it with or without food. Do not cut, crush or chew this medicine. Take your dose at the same time each day. Do not take more often than directed. Do not stop taking except on your doctor's advice. A special MedGuide will be given to you by the pharmacist with each prescription and refill. Be sure to read this information carefully each time. Talk to your pediatrician regarding the use of this medicine in children. Special care may be needed. Overdosage: If you think you have taken too much of this medicine contact a poison control center or emergency room at once. NOTE: This medicine is only for you. Do not share this medicine with others. What if I miss a dose? If you miss a dose, take it as soon as you can. If it is almost time for your next dose, take only that dose. Do not take double or extra doses. What may interact with this medicine? Do not take this medicine with any of the following medications: -gatifloxacin This medicine may also interact  with the following medications: -alcohol -digoxin -insulin -sulfonylureas like glimepiride, glipizide, glyburide This list may not describe all possible interactions. Give your health care provider a list of all the medicines, herbs, non-prescription drugs, or dietary supplements you use. Also tell them if you smoke, drink alcohol, or use illegal drugs. Some items may interact with your medicine. What should I watch for while using this medicine? Visit your doctor or health care professional for regular checks on your progress. A test called the HbA1C (A1C) will be monitored. This is a simple blood test. It measures your blood sugar control over the last 2 to 3 months. You will receive this test every 3 to 6 months. Learn how to check your blood sugar. Learn the symptoms of low and high blood sugar and how to manage them. Always carry a quick-source of sugar with you in case you have symptoms of low blood sugar. Examples include hard sugar candy or glucose tablets. Make sure others know that you can choke if you eat or drink when you develop serious symptoms of low blood sugar, such as seizures or unconsciousness. They must get medical help at once. Tell your doctor or health care professional if you have high blood sugar. You might need to change the dose of your medicine. If you are sick or exercising more than usual, you might need to change the dose of your medicine. Do not skip meals. Ask your doctor or health care professional if you should avoid alcohol. Many nonprescription cough and cold products contain sugar or alcohol. These can affect blood sugar.   Wear a medical ID bracelet or chain, and carry a card that describes your disease and details of your medicine and dosage times. What side effects may I notice from receiving this medicine? Side effects that you should report to your doctor or health care professional as soon as possible: -allergic reactions like skin rash, itching or hives,  swelling of the face, lips, or tongue -breathing problems -general ill feeling or flu-like symptoms -joint pain -loss of appetite -redness, blistering, peeling or loosening of the skin, including inside the mouth -signs and symptoms of low blood sugar such as feeling anxious, confusion, dizziness, increased hunger, unusually weak or tired, sweating, shakiness, cold, irritable, headache, blurred vision, fast heartbeat, loss of consciousness -unusual stomach pain or discomfort -vomiting Side effects that usually do not require medical attention (report to your doctor or health care professional if they continue or are bothersome): -diarrhea -headache -sore throat -stomach upset -stuffy or runny nose This list may not describe all possible side effects. Call your doctor for medical advice about side effects. You may report side effects to FDA at 1-800-FDA-1088. Where should I keep my medicine? Keep out of the reach of children. Store at room temperature between 15 and 30 degrees C (59 and 86 degrees F). Throw away any unused medicine after the expiration date. NOTE: This sheet is a summary. It may not cover all possible information. If you have questions about this medicine, talk to your doctor, pharmacist, or health care provider.  2018 Elsevier/Gold Standard (2016-05-29 14:23:55)  

## 2017-07-29 DIAGNOSIS — Z961 Presence of intraocular lens: Secondary | ICD-10-CM | POA: Diagnosis not present

## 2017-08-06 DIAGNOSIS — H35362 Drusen (degenerative) of macula, left eye: Secondary | ICD-10-CM | POA: Diagnosis not present

## 2017-08-19 ENCOUNTER — Other Ambulatory Visit: Payer: Self-pay | Admitting: Physician Assistant

## 2017-08-19 DIAGNOSIS — E1122 Type 2 diabetes mellitus with diabetic chronic kidney disease: Secondary | ICD-10-CM

## 2017-08-19 DIAGNOSIS — N182 Chronic kidney disease, stage 2 (mild): Secondary | ICD-10-CM

## 2017-08-23 DIAGNOSIS — H35352 Cystoid macular degeneration, left eye: Secondary | ICD-10-CM | POA: Diagnosis not present

## 2017-08-24 ENCOUNTER — Ambulatory Visit: Payer: Self-pay | Admitting: Physician Assistant

## 2017-08-24 ENCOUNTER — Encounter: Payer: Self-pay | Admitting: Physician Assistant

## 2017-08-24 VITALS — BP 158/102 | HR 74 | Temp 98.2°F

## 2017-08-24 DIAGNOSIS — T63421A Toxic effect of venom of ants, accidental (unintentional), initial encounter: Secondary | ICD-10-CM

## 2017-08-24 MED ORDER — METHYLPREDNISOLONE 4 MG PO TBPK: ORAL_TABLET | ORAL | 0 refills | Status: DC

## 2017-08-24 NOTE — Progress Notes (Signed)
S: states she was mowing the lawn and stepped in fire ants, has several bites on her feet and lower legs, feet have been itching so badly she can't wear shoes, no fever/chills, no other injury  O: vitals wnl, nad, skin with several red bumps on lower legs and feet, some scratch marks, no drainage , n/v intact  A: fire ant bites  P: medrol dose pack

## 2017-08-30 DIAGNOSIS — H35352 Cystoid macular degeneration, left eye: Secondary | ICD-10-CM | POA: Diagnosis not present

## 2017-09-14 DIAGNOSIS — H35352 Cystoid macular degeneration, left eye: Secondary | ICD-10-CM | POA: Diagnosis not present

## 2017-09-28 DIAGNOSIS — H35352 Cystoid macular degeneration, left eye: Secondary | ICD-10-CM | POA: Diagnosis not present

## 2017-10-01 ENCOUNTER — Ambulatory Visit
Admission: RE | Admit: 2017-10-01 | Discharge: 2017-10-01 | Disposition: A | Discharge status: Home / Self Care | Payer: PRIVATE HEALTH INSURANCE | Source: Ambulatory Visit | Attending: Family | Admitting: Family

## 2017-10-01 ENCOUNTER — Other Ambulatory Visit: Payer: Self-pay | Admitting: Family

## 2017-10-01 DIAGNOSIS — M4696 Unspecified inflammatory spondylopathy, lumbar region: Secondary | ICD-10-CM | POA: Insufficient documentation

## 2017-10-01 DIAGNOSIS — R52 Pain, unspecified: Secondary | ICD-10-CM

## 2017-10-01 DIAGNOSIS — M1611 Unilateral primary osteoarthritis, right hip: Secondary | ICD-10-CM | POA: Diagnosis not present

## 2017-10-01 DIAGNOSIS — M8588 Other specified disorders of bone density and structure, other site: Secondary | ICD-10-CM | POA: Insufficient documentation

## 2017-10-01 DIAGNOSIS — M25551 Pain in right hip: Secondary | ICD-10-CM | POA: Diagnosis present

## 2017-10-13 ENCOUNTER — Ambulatory Visit: Payer: Self-pay | Admitting: Family

## 2017-10-13 VITALS — BP 150/80 | HR 86 | Temp 98.2°F

## 2017-10-13 DIAGNOSIS — R05 Cough: Secondary | ICD-10-CM

## 2017-10-13 DIAGNOSIS — R059 Cough, unspecified: Secondary | ICD-10-CM

## 2017-10-13 MED ORDER — LEVOFLOXACIN 500 MG PO TABS: 500.0000 mg | ORAL_TABLET | Freq: Every day | ORAL | 0 refills | Status: DC

## 2017-10-13 MED ORDER — FLUCONAZOLE 150 MG PO TABS: ORAL_TABLET | ORAL | 0 refills | Status: DC

## 2017-10-13 NOTE — Progress Notes (Signed)
S/: 3 -4 days of malaise ,fatigue ,ear pressure ,facial pressure, deep  Cough, green mucus; denies chest pain shortness of breath or GI symptoms. Former smoker  O/: Alert pleasant with harsh cough noted at intervals NAD ENT TMs dull retracted, injected, nasal mucosa erythematous with purulent rhinorrhea, pharynx injected, without exudate ,neck is supple nontender, heart RSR, lungs are clear  A/cough  P/: Discussed supportive measures. Rx Levaquin. Rx for Diflucan for when necessary use. Follow-up when necessary not improving

## 2017-11-03 ENCOUNTER — Other Ambulatory Visit: Payer: Self-pay | Admitting: Physician Assistant

## 2017-11-03 DIAGNOSIS — I10 Essential (primary) hypertension: Secondary | ICD-10-CM

## 2017-11-03 DIAGNOSIS — E7800 Pure hypercholesterolemia, unspecified: Secondary | ICD-10-CM

## 2017-11-03 DIAGNOSIS — N182 Chronic kidney disease, stage 2 (mild): Secondary | ICD-10-CM

## 2017-11-03 DIAGNOSIS — E1122 Type 2 diabetes mellitus with diabetic chronic kidney disease: Secondary | ICD-10-CM

## 2017-11-03 NOTE — Telephone Encounter (Signed)
spreadsheet

## 2017-11-24 NOTE — Patient Outreach (Signed)
[facility]) Care Management  11/24/2017  [name] [birth year] [id number]  [name] is involved in the [facility] program, therefore I will discharge her from [facility].  I will continue to guide her in managing her diabetes through the [facility] app.    Gentry Fitz, RN, BA, [place], [place] Direct Dial:  [phone]  Fax:  [phone] E-mail: [email or web address] [address], [place], [place]  [address]

## 2017-12-15 ENCOUNTER — Ambulatory Visit (INDEPENDENT_AMBULATORY_CARE_PROVIDER_SITE_OTHER): Payer: Self-pay | Admitting: Nurse Practitioner

## 2017-12-15 VITALS — BP 164/96 | HR 65 | Wt 210.0 lb

## 2017-12-15 DIAGNOSIS — J069 Acute upper respiratory infection, unspecified: Secondary | ICD-10-CM

## 2017-12-15 MED ORDER — AZITHROMYCIN 250 MG PO TABS: ORAL_TABLET | ORAL | 0 refills | Status: AC

## 2017-12-15 MED ORDER — DEXTROMETHORPHAN-GUAIFENESIN 10-100 MG/5ML PO LIQD: 5.0000 mL | Freq: Four times a day (QID) | ORAL | 0 refills | Status: AC | PRN

## 2017-12-15 NOTE — Progress Notes (Signed)
Subjective:  [name] is a 62 y.o. female who presents for evaluation of URI like symptoms.  Symptoms include achiness, congestion, fever: suspected fevers but not measured at home, headache described as dull and aching, post nasal drip and productive cough with yellow colored sputum.  Onset of symptoms was 10 days ago, and has been gradually worsening since that time.  Treatment to date:  decongestants.  High risk factors for influenza complications:  none.  The following portions of the patient's history were reviewed and updated as appropriate:  allergies, current medications and past medical history.  Constitutional: positive for chills, fatigue and fevers, negative for anorexia and sweats Eyes: negative Ears, nose, mouth, throat, and face: positive for nasal congestion and sore throat, negative for ear drainage and earaches Respiratory: positive for cough Cardiovascular: negative Neurological: positive for dizziness and headaches Allergic/Immunologic: positive for hay fever Objective:  BP (!) 164/96   Pulse 65   Wt 210 lb (95.3 kg)   SpO2 98%   BMI 33.89 kg/m  General appearance: alert, cooperative and fatigued Head: Normocephalic, without obvious abnormality, atraumatic Eyes: conjunctivae/corneas clear. PERRL, EOM's intact. Fundi benign. Ears: normal TM's and external ear canals both ears Nose: yellow discharge, turbinates swollen, inflamed, no sinus tenderness Throat: lips, mucosa, and tongue normal; teeth and gums normal Lungs: clear to auscultation bilaterally Heart: regular rate and rhythm, S1, S2 normal, no murmur, click, rub or gallop Pulses: 2+ and symmetric Skin: Skin color, texture, turgor normal. No rashes or lesions Lymph nodes: cervical nodes normal Neurologic: Grossly normal    Assessment:  Acute Upper Respiratory Infection    Plan:  Discussed diagnosis and treatment of URI. Suggested symptomatic OTC remedies. Supportive care with appropriate  antipyretics and fluids. Nasal saline spray for congestion. Zithromax per orders. Follow up as needed. Call in 5 days if symptoms aren't resolving. Tussin prescribed for cough.

## 2017-12-17 ENCOUNTER — Telehealth: Payer: Self-pay

## 2017-12-20 ENCOUNTER — Other Ambulatory Visit: Payer: Self-pay | Admitting: Physician Assistant

## 2017-12-20 DIAGNOSIS — Z1231 Encounter for screening mammogram for malignant neoplasm of breast: Secondary | ICD-10-CM

## 2017-12-21 DIAGNOSIS — M1611 Unilateral primary osteoarthritis, right hip: Secondary | ICD-10-CM | POA: Diagnosis not present

## 2017-12-29 ENCOUNTER — Encounter: Payer: 59 | Admitting: Physician Assistant

## 2017-12-30 DIAGNOSIS — M1611 Unilateral primary osteoarthritis, right hip: Secondary | ICD-10-CM | POA: Diagnosis not present

## 2018-01-05 ENCOUNTER — Ambulatory Visit
Admission: RE | Admit: 2018-01-05 | Discharge: 2018-01-05 | Disposition: A | Discharge status: Home / Self Care | Payer: 59 | Source: Ambulatory Visit | Attending: Physician Assistant | Admitting: Physician Assistant

## 2018-01-05 DIAGNOSIS — Z1231 Encounter for screening mammogram for malignant neoplasm of breast: Secondary | ICD-10-CM | POA: Insufficient documentation

## 2018-01-06 ENCOUNTER — Telehealth: Payer: Self-pay

## 2018-01-06 NOTE — Telephone Encounter (Signed)
Left message saying that results are available to view on mychart. Advised to call if any questions.

## 2018-01-06 NOTE — Telephone Encounter (Signed)
-----   Message from Mar Daring, PA-C sent at 01/06/2018  9:20 AM EST ----- Normal mammogram. Repeat screening in one year.

## 2018-01-10 ENCOUNTER — Other Ambulatory Visit: Payer: Self-pay

## 2018-01-10 ENCOUNTER — Other Ambulatory Visit: Payer: Self-pay | Admitting: Physician Assistant

## 2018-01-10 DIAGNOSIS — F419 Anxiety disorder, unspecified: Secondary | ICD-10-CM

## 2018-01-10 MED ORDER — MELOXICAM 15 MG PO TABS: 15.0000 mg | ORAL_TABLET | Freq: Every day | ORAL | 5 refills | Status: DC

## 2018-01-10 NOTE — Telephone Encounter (Signed)
Called into [facility] pharmacy.

## 2018-01-10 NOTE — Telephone Encounter (Signed)
Patient is requesting a refill on Meloxicam 15 mg. She states she was getting refills from Ashok Cordia at the [facility].Since she is no longer there patient is unable to get refills. [facility] refills from PCP.  [place]  CB#[phone]

## 2018-02-01 DIAGNOSIS — M1611 Unilateral primary osteoarthritis, right hip: Secondary | ICD-10-CM | POA: Diagnosis not present

## 2018-02-01 DIAGNOSIS — M25551 Pain in right hip: Secondary | ICD-10-CM | POA: Diagnosis not present

## 2018-02-11 DIAGNOSIS — M1611 Unilateral primary osteoarthritis, right hip: Secondary | ICD-10-CM | POA: Diagnosis not present

## 2018-02-20 DIAGNOSIS — M1611 Unilateral primary osteoarthritis, right hip: Secondary | ICD-10-CM | POA: Insufficient documentation

## 2018-02-24 ENCOUNTER — Encounter: Payer: Self-pay | Admitting: Physician Assistant

## 2018-02-24 ENCOUNTER — Ambulatory Visit (INDEPENDENT_AMBULATORY_CARE_PROVIDER_SITE_OTHER): Payer: 59 | Admitting: Physician Assistant

## 2018-02-24 VITALS — BP 160/100 | HR 84 | Temp 98.1°F | Resp 16 | Wt 211.0 lb

## 2018-02-24 DIAGNOSIS — E1142 Type 2 diabetes mellitus with diabetic polyneuropathy: Secondary | ICD-10-CM

## 2018-02-24 DIAGNOSIS — N182 Chronic kidney disease, stage 2 (mild): Secondary | ICD-10-CM

## 2018-02-24 DIAGNOSIS — E1165 Type 2 diabetes mellitus with hyperglycemia: Secondary | ICD-10-CM

## 2018-02-24 DIAGNOSIS — E1122 Type 2 diabetes mellitus with diabetic chronic kidney disease: Secondary | ICD-10-CM | POA: Diagnosis not present

## 2018-02-24 DIAGNOSIS — IMO0002 Reserved for concepts with insufficient information to code with codable children: Secondary | ICD-10-CM

## 2018-02-24 DIAGNOSIS — E7800 Pure hypercholesterolemia, unspecified: Secondary | ICD-10-CM

## 2018-02-24 LAB — POCT GLYCOSYLATED HEMOGLOBIN (HGB A1C)
Est. average glucose Bld gHb Est-mCnc: 206
Hemoglobin A1C: 8.8

## 2018-02-24 MED ORDER — PRAVASTATIN SODIUM 40 MG PO TABS: 40.0000 mg | ORAL_TABLET | Freq: Every day | ORAL | 1 refills | Status: DC

## 2018-02-24 MED ORDER — SITAGLIPTIN PHOSPHATE 100 MG PO TABS: 100.0000 mg | ORAL_TABLET | Freq: Every day | ORAL | 1 refills | Status: DC

## 2018-02-24 NOTE — Progress Notes (Signed)
Patient: [name] Female    DOB: 12/06/1956   62 y.o.   MRN: [id number] Visit Date: 02/24/2018  Today's Provider: Mar Daring, PA-C   Chief Complaint  Patient presents with  . Follow-up    T2DM   Subjective:    HPI  Diabetes Mellitus Type II, Follow-up:   Lab Results  Component Value Date   HGBA1C 8.8 02/24/2018   HGBA1C 8.5 07/15/2017   HGBA1C 9.6 03/25/2017    Last seen for diabetes 6 months ago.  Management since then includes continue current medication. Start Januvia for better control. She reports good compliance with treatment. She is not having side effects.  Current symptoms include none and have been stable. Home blood sugar records: not checking per patient her meter broke.  Episodes of hypoglycemia? no   Current Insulin Regimen:  Most Recent Eye Exam: She report she had surgery 11/2017 and is due to go back. Weight trend: stable Prior visit with dietician: yes. Wellness program Current diet: in general, a "healthy" diet   Current exercise: not able to walk as she was. She is having a hip replacement 04/09/18. She is followed by Ortho at [place].  Pertinent Labs:    Component Value Date/Time   CHOL 180 11/13/2016 1343   CHOL 205 (H) 09/06/2013 1518   TRIG 173 (H) 11/13/2016 1343   TRIG 174 09/06/2013 1518   HDL 55 11/13/2016 1343   HDL 53 09/06/2013 1518   LDLCALC 90 11/13/2016 1343   LDLCALC 117 (H) 09/06/2013 1518   CREATININE 0.81 11/13/2016 1343   CREATININE 0.80 09/06/2013 1518    Wt Readings from Last 3 Encounters:  02/24/18 211 lb (95.7 kg)  12/15/17 210 lb (95.3 kg)  07/15/17 203 lb (92.1 kg)    ------------------------------------------------------------------------ Patient is also requesting refills on the following medication Pravastatin    Allergies  Allergen Reactions  . Jardiance [Empagliflozin] Itching    Recurrent mycotic infections  . Penicillins Rash    Has patient had a PCN reaction causing  immediate rash, facial/tongue/throat swelling, SOB or lightheadedness with hypotension: No Has patient had a PCN reaction causing severe rash involving mucus membranes or skin necrosis: No Has patient had a PCN reaction that required hospitalization No Has patient had a PCN reaction occurring within the last 10 years: No If all of the above answers are "NO", then may proceed with Cephalosporin use.      Current Outpatient Medications:  .  ALPRAZolam (XANAX) 0.5 MG tablet, TAKE 1/2 TO 1 TABLET BY MOUTH BEFORE WORK AND TAKE 1 TABLET EVERY 8 HOURS AS NEEDED, Disp: 30 tablet, Rfl: 5 .  aspirin 81 MG tablet, Take 81 mg by mouth daily., Disp: , Rfl:  .  Cholecalciferol (VITAMIN D) 2000 units CAPS, Take 1 capsule by mouth daily., Disp: , Rfl:  .  glipiZIDE (GLUCOTROL XL) 10 MG 24 hr tablet, TAKE 1 TABLET BY MOUTH DAILY WITH BREAKFAST., Disp: 90 tablet, Rfl: 1 .  hydrochlorothiazide (HYDRODIURIL) 25 MG tablet, Take 1 tablet (25 mg total) by mouth daily., Disp: 90 tablet, Rfl: 1 .  losartan (COZAAR) 25 MG tablet, TAKE 1 TABLET BY MOUTH TWICE DAILY, Disp: 180 tablet, Rfl: 1 .  meloxicam (MOBIC) 15 MG tablet, Take 1 tablet (15 mg total) by mouth daily., Disp: 30 tablet, Rfl: 5 .  metFORMIN (GLUCOPHAGE) 1000 MG tablet, TAKE 1 TABLET BY MOUTH TWICE A DAY WITH A MEAL., Disp: 180 tablet, Rfl: 1 .  Multiple  Vitamin (MULTIVITAMIN) tablet, Take 1 tablet by mouth daily., Disp: , Rfl:  .  naproxen sodium (ANAPROX) 220 MG tablet, Take 220 mg by mouth as needed (pain). Reported on 05/06/2016, Disp: , Rfl:  .  pravastatin (PRAVACHOL) 40 MG tablet, Take 1 tablet (40 mg total) by mouth daily., Disp: 90 tablet, Rfl: 1 .  sertraline (ZOLOFT) 100 MG tablet, Take 1 tablet (100 mg total) by mouth daily., Disp: 90 tablet, Rfl: 1 .  sitaGLIPtin (JANUVIA) 100 MG tablet, Take 1 tablet (100 mg total) by mouth daily., Disp: 28 tablet, Rfl: 0 .  diclofenac sodium (VOLTAREN) 1 % GEL, Apply 2 g topically 4 (four) times daily.  (Patient not taking: Reported on 08/24/2017), Disp: 100 g, Rfl: 3 .  fluconazole (DIFLUCAN) 150 MG tablet, Take one at onset of symptoms. May repeat in 2-3 days prn. (Patient not taking: Reported on 12/15/2017), Disp: 2 tablet, Rfl: 0 .  fluticasone (FLONASE) 50 MCG/ACT nasal spray, Place 2 sprays into both nostrils daily. For allergies (Patient not taking: Reported on 10/13/2017), Disp: 16 g, Rfl: 11 .  glucose blood (ACCU-CHEK GUIDE) test strip, Check blood sugar 2-3 times daily (Patient not taking: Reported on 02/24/2018), Disp: 300 each, Rfl: 12 .  Lancets (ACCU-CHEK SOFT TOUCH) lancets, Check blood sugar 2-3 times daily (Patient not taking: Reported on 02/24/2018), Disp: 300 each, Rfl: 12 .  levofloxacin (LEVAQUIN) 500 MG tablet, Take 1 tablet (500 mg total) by mouth daily. (Patient not taking: Reported on 12/15/2017), Disp: 7 tablet, Rfl: 0 .  loratadine (CLARITIN) 10 MG tablet, Take 1 tablet (10 mg total) by mouth daily. (Patient not taking: Reported on 10/13/2017), Disp: 90 tablet, Rfl: 1 .  methylPREDNISolone (MEDROL DOSEPAK) 4 MG TBPK tablet, Take 6 pills on day one then decrease by 1 pill each day (Patient not taking: Reported on 10/13/2017), Disp: 21 tablet, Rfl: 0 .  montelukast (SINGULAIR) 10 MG tablet, Take 1 tablet (10 mg total) by mouth at bedtime. For allergies (Patient not taking: Reported on 12/15/2017), Disp: 30 tablet, Rfl: 11  Review of Systems  Constitutional: Negative.   Respiratory: Negative.   Cardiovascular: Negative for chest pain, palpitations and leg swelling.  Gastrointestinal: Negative.   Musculoskeletal: Positive for arthralgias and myalgias.  Neurological: Negative.     Social History   Tobacco Use  . Smoking status: Former Smoker    Packs/day: 1.50    Years: 25.00    Pack years: 37.50    Types: Cigarettes  . Smokeless tobacco: Never Used  Substance Use Topics  . Alcohol use: Yes    Alcohol/week: 0.0 oz    Comment: 1- every 3-4 months   Objective:   BP (!)  160/100 (BP Location: Left Arm, Patient Position: Sitting, Cuff Size: Large)   Pulse 84   Temp 98.1 F (36.7 C) (Oral)   Resp 16   Wt 211 lb (95.7 kg)   SpO2 97%   BMI 34.06 kg/m    Physical Exam  Constitutional: She appears well-developed and well-nourished. No distress.  Neck: Normal range of motion. Neck supple.  Cardiovascular: Normal rate, regular rhythm and normal heart sounds. Exam reveals no gallop and no friction rub.  No murmur heard. Pulmonary/Chest: Effort normal and breath sounds normal. No respiratory distress. She has no wheezes. She has no rales.  Skin: She is not diaphoretic.  Vitals reviewed.      Assessment & Plan:     1. Type 2 diabetes mellitus with stage 2 chronic kidney disease, without long-term  current use of insulin (HCC) A1c up to 8.8 today. Patient admits to not taking Januvia. States some days she didn't take any of her medications because her hip hurt too much to get out of bed. She also admits to not being compliant with diet, "I been eating a lot of chinese buffets." Advised patient to continue metformin 1000 mg BID, Glipizide 10mg  XR daily, Januvia 100mg  daily. Added Ozempic today in the office to optimize A1c prior to surgery scheduled in 5 weeks. She is to give SQ injection once weekly. She agrees. First injection started today in the office. I will see her back in 4 weeks, week prior to surgery to recheck A1c to see if optimized.  - POCT glycosylated hemoglobin (Hb A1C)  2. Hypercholesterolemia Stable. Diagnosis pulled for medication refill. Continue current medical treatment plan. - pravastatin (PRAVACHOL) 40 MG tablet; Take 1 tablet (40 mg total) by mouth daily.  Dispense: 90 tablet; Refill: 1  3. Uncontrolled type 2 diabetes mellitus with diabetic polyneuropathy, without long-term current use of insulin (HCC) Stable. Diagnosis pulled for medication refill. Continue current medical treatment plan. - sitaGLIPtin (JANUVIA) 100 MG tablet; Take 1  tablet (100 mg total) by mouth daily.  Dispense: 90 tablet; Refill: [place], PA-C  [facility]

## 2018-02-24 NOTE — Patient Instructions (Signed)
Semaglutide injection solution What is this medicine? SEMAGLUTIDE (Sem a GLOO tide) is used to improve blood sugar control in adults with type 2 diabetes. This medicine may be used with other diabetes medicines. This medicine may be used for other purposes; ask your health care provider or pharmacist if you have questions. COMMON BRAND NAME(S): OZEMPIC What should I tell my health care provider before I take this medicine? They need to know if you have any of these conditions: -endocrine tumors (MEN 2) or if someone in your family had these tumors -eye disease, vision problems -history of pancreatitis -kidney disease -stomach problems -thyroid cancer or if someone in your family had thyroid cancer -an unusual or allergic reaction to semaglutide, other medicines, foods, dyes, or preservatives -pregnant or trying to get pregnant -breast-feeding How should I use this medicine? This medicine is for injection under the skin of your upper leg (thigh), stomach area, or upper arm. It is given once every week (every 7 days). You will be taught how to prepare and give this medicine. Use exactly as directed. Take your medicine at regular intervals. Do not take it more often than directed. If you use this medicine with insulin, you should inject this medicine and the insulin separately. Do not mix them together. Do not give the injections right next to each other. Change (rotate) injection sites with each injection. It is important that you put your used needles and syringes in a special sharps container. Do not put them in a trash can. If you do not have a sharps container, call your pharmacist or healthcare provider to get one. A special MedGuide will be given to you by the pharmacist with each prescription and refill. Be sure to read this information carefully each time. Talk to your pediatrician regarding the use of this medicine in children. Special care may be needed. Overdosage: If you think you  have taken too much of this medicine contact a poison control center or emergency room at once. NOTE: This medicine is only for you. Do not share this medicine with others. What if I miss a dose? If you miss a dose, take it as soon as you can within 5 days after the missed dose. Then take your next dose at your regular weekly time. If it has been longer than 5 days after the missed dose, do not take the missed dose. Take the next dose at your regular time. Do not take double or extra doses. If you have questions about a missed dose, contact your health care provider for advice. What may interact with this medicine? -other medicines for diabetes Many medications may cause changes in blood sugar, these include: -alcohol containing beverages -antiviral medicines for HIV or AIDS -aspirin and aspirin-like drugs -certain medicines for blood pressure, heart disease, irregular heart beat -chromium -diuretics -female hormones, such as estrogens or progestins, birth control pills -fenofibrate -gemfibrozil -isoniazid -lanreotide -female hormones or anabolic steroids -MAOIs like Carbex, Eldepryl, Marplan, Nardil, and Parnate -medicines for weight loss -medicines for allergies, asthma, cold, or cough -medicines for depression, anxiety, or psychotic disturbances -niacin -nicotine -NSAIDs, medicines for pain and inflammation, like ibuprofen or naproxen -octreotide -pasireotide -pentamidine -phenytoin -probenecid -quinolone antibiotics such as ciprofloxacin, levofloxacin, ofloxacin -some herbal dietary supplements -steroid medicines such as prednisone or cortisone -sulfamethoxazole; trimethoprim -thyroid hormones Some medications can hide the warning symptoms of low blood sugar (hypoglycemia). You may need to monitor your blood sugar more closely if you are taking one of these medications. These include: -  beta-blockers, often used for high blood pressure or heart problems (examples include  atenolol, metoprolol, propranolol) -clonidine -guanethidine -reserpine This list may not describe all possible interactions. Give your health care provider a list of all the medicines, herbs, non-prescription drugs, or dietary supplements you use. Also tell them if you smoke, drink alcohol, or use illegal drugs. Some items may interact with your medicine. What should I watch for while using this medicine? Visit your doctor or health care professional for regular checks on your progress. Drink plenty of fluids while taking this medicine. Check with your doctor or health care professional if you get an attack of severe diarrhea, nausea, and vomiting. The loss of too much body fluid can make it dangerous for you to take this medicine. A test called the HbA1C (A1C) will be monitored. This is a simple blood test. It measures your blood sugar control over the last 2 to 3 months. You will receive this test every 3 to 6 months. Learn how to check your blood sugar. Learn the symptoms of low and high blood sugar and how to manage them. Always carry a quick-source of sugar with you in case you have symptoms of low blood sugar. Examples include hard sugar candy or glucose tablets. Make sure others know that you can choke if you eat or drink when you develop serious symptoms of low blood sugar, such as seizures or unconsciousness. They must get medical help at once. Tell your doctor or health care professional if you have high blood sugar. You might need to change the dose of your medicine. If you are sick or exercising more than usual, you might need to change the dose of your medicine. Do not skip meals. Ask your doctor or health care professional if you should avoid alcohol. Many nonprescription cough and cold products contain sugar or alcohol. These can affect blood sugar. Pens should never be shared. Even if the needle is changed, sharing may result in passing of viruses like hepatitis or HIV. Wear a medical  ID bracelet or chain, and carry a card that describes your disease and details of your medicine and dosage times. What side effects may I notice from receiving this medicine? Side effects that you should report to your doctor or health care professional as soon as possible: -allergic reactions like skin rash, itching or hives, swelling of the face, lips, or tongue -breathing problems -changes in vision -diarrhea that continues or is severe -lump or swelling on the neck -severe nausea -signs and symptoms of infection like fever or chills; cough; sore throat; pain or trouble passing urine -signs and symptoms of low blood sugar such as feeling anxious, confusion, dizziness, increased hunger, unusually weak or tired, sweating, shakiness, cold, irritable, headache, blurred vision, fast heartbeat, loss of consciousness -signs and symptoms of kidney injury like trouble passing urine or change in the amount of urine -trouble swallowing -unusual stomach upset or pain -vomiting Side effects that usually do not require medical attention (report to your doctor or health care professional if they continue or are bothersome): -constipation -diarrhea -nausea -pain, redness, or irritation at site where injected -stomach upset This list may not describe all possible side effects. Call your doctor for medical advice about side effects. You may report side effects to FDA at 1-800-FDA-1088. Where should I keep my medicine? Keep out of the reach of children. Store unopened pens in a refrigerator between 2 and 8 degrees C (36 and 46 degrees F). Do not freeze. Protect from  to your doctor or health care professional if they continue or are bothersome):  -constipation  -diarrhea  -nausea  -pain, redness, or irritation at site where injected  -stomach upset  This list may not describe all possible side effects. Call your doctor for medical advice about side effects. You may report side effects to FDA at 1-800-FDA-1088.  Where should I keep my medicine?  Keep out of the reach of children.  Store unopened pens in a refrigerator between 2 and 8 degrees C (36 and 46 degrees F). Do not freeze. Protect from light and heat. After you first use the pen, it can be stored for 56 days at room temperature between 15 and 30 degrees C (59 and 86 degrees F) or in a refrigerator. Throw away  your used pen after 56 days or after the expiration date, whichever comes first.  Do not store your pen with the needle attached. If the needle is left on, medicine may leak from the pen.  NOTE: This sheet is a summary. It may not cover all possible information. If you have questions about this medicine, talk to your doctor, pharmacist, or health care provider.  © 2018 Elsevier/Gold Standard (2016-12-17 14:43:35)

## 2018-03-02 ENCOUNTER — Other Ambulatory Visit: Payer: Self-pay | Admitting: Physician Assistant

## 2018-03-02 DIAGNOSIS — I10 Essential (primary) hypertension: Secondary | ICD-10-CM

## 2018-03-03 ENCOUNTER — Other Ambulatory Visit: Payer: Self-pay | Admitting: Physician Assistant

## 2018-03-03 DIAGNOSIS — E1142 Type 2 diabetes mellitus with diabetic polyneuropathy: Secondary | ICD-10-CM

## 2018-03-03 DIAGNOSIS — IMO0002 Reserved for concepts with insufficient information to code with codable children: Secondary | ICD-10-CM

## 2018-03-03 DIAGNOSIS — E1165 Type 2 diabetes mellitus with hyperglycemia: Principal | ICD-10-CM

## 2018-03-08 ENCOUNTER — Ambulatory Visit (INDEPENDENT_AMBULATORY_CARE_PROVIDER_SITE_OTHER): Payer: 59 | Admitting: Physician Assistant

## 2018-03-08 ENCOUNTER — Encounter: Payer: Self-pay | Admitting: Physician Assistant

## 2018-03-08 VITALS — BP 138/86 | HR 83 | Temp 98.2°F | Resp 16 | Wt 206.0 lb

## 2018-03-08 DIAGNOSIS — R112 Nausea with vomiting, unspecified: Secondary | ICD-10-CM

## 2018-03-08 DIAGNOSIS — N182 Chronic kidney disease, stage 2 (mild): Secondary | ICD-10-CM | POA: Diagnosis not present

## 2018-03-08 DIAGNOSIS — E1122 Type 2 diabetes mellitus with diabetic chronic kidney disease: Secondary | ICD-10-CM

## 2018-03-08 DIAGNOSIS — R197 Diarrhea, unspecified: Secondary | ICD-10-CM | POA: Diagnosis not present

## 2018-03-08 MED ORDER — ONDANSETRON 4 MG PO TBDP: 4.0000 mg | ORAL_TABLET | Freq: Three times a day (TID) | ORAL | 0 refills | Status: DC | PRN

## 2018-03-08 NOTE — Progress Notes (Signed)
Patient: [name] Female    DOB: March 17, 1956   62 y.o.   MRN: [id number] Visit Date: 03/08/2018  Today's Provider: Trinna Post, PA-C   Chief Complaint  Patient presents with  . Emesis  . URI   Subjective:    [name] is a 62 y/o woman with uncontrolled Type II DM presenting today with nausea, vomiting and diarrhea x 1 week. She denies travel outside of the country, recent hospitalizations or antibiotics. She denies fevers or blood in her stool. She denies abdominal pain. She has recently been started on ozempic on 02/24/2018 and had her second injection 03/04/2018. She said she could only eat five bites of a gravy biscuit before experiencing diarrhea. Then she could only eat five bites of chicken pie before the same. She tolerated applesauce and water 30 minutes ago.  Emesis   This is a new problem. The current episode started in the past 7 days. The problem has been unchanged. There has been no fever. Associated symptoms include coughing, diarrhea and URI. Pertinent negatives include no abdominal pain, chills, dizziness, fever or headaches.  URI   This is a new problem. There has been no fever. Associated symptoms include congestion, coughing, diarrhea, ear pain, rhinorrhea, sinus pain and vomiting. Pertinent negatives include no abdominal pain, headaches, nausea, plugged ear sensation, sneezing, sore throat or wheezing.       Allergies  Allergen Reactions  . Jardiance [Empagliflozin] Itching    Recurrent mycotic infections  . Penicillins Rash    Has patient had a PCN reaction causing immediate rash, facial/tongue/throat swelling, SOB or lightheadedness with hypotension: No Has patient had a PCN reaction causing severe rash involving mucus membranes or skin necrosis: No Has patient had a PCN reaction that required hospitalization No Has patient had a PCN reaction occurring within the last 10 years: No If all of the above answers are "NO", then may proceed with  Cephalosporin use.      Current Outpatient Medications:  .  ALPRAZolam (XANAX) 0.5 MG tablet, TAKE 1/2 TO 1 TABLET BY MOUTH BEFORE WORK AND TAKE 1 TABLET EVERY 8 HOURS AS NEEDED, Disp: 30 tablet, Rfl: 5 .  aspirin 81 MG tablet, Take 81 mg by mouth daily., Disp: , Rfl:  .  Cholecalciferol (VITAMIN D) 2000 units CAPS, Take 1 capsule by mouth daily., Disp: , Rfl:  .  diclofenac sodium (VOLTAREN) 1 % GEL, Apply 2 g topically 4 (four) times daily., Disp: 100 g, Rfl: 3 .  fluconazole (DIFLUCAN) 150 MG tablet, Take one at onset of symptoms. May repeat in 2-3 days prn., Disp: 2 tablet, Rfl: 0 .  fluticasone (FLONASE) 50 MCG/ACT nasal spray, Place 2 sprays into both nostrils daily. For allergies, Disp: 16 g, Rfl: 11 .  glipiZIDE (GLUCOTROL XL) 10 MG 24 hr tablet, TAKE 1 TABLET BY MOUTH DAILY WITH BREAKFAST., Disp: 90 tablet, Rfl: 1 .  hydrochlorothiazide (HYDRODIURIL) 25 MG tablet, TAKE 1 TABLET (25 MG TOTAL) BY MOUTH DAILY., Disp: 90 tablet, Rfl: 1 .  loratadine (CLARITIN) 10 MG tablet, Take 1 tablet (10 mg total) by mouth daily., Disp: 90 tablet, Rfl: 1 .  losartan (COZAAR) 25 MG tablet, TAKE 1 TABLET BY MOUTH TWICE DAILY, Disp: 180 tablet, Rfl: 1 .  meloxicam (MOBIC) 15 MG tablet, Take 1 tablet (15 mg total) by mouth daily., Disp: 30 tablet, Rfl: 5 .  metFORMIN (GLUCOPHAGE) 1000 MG tablet, TAKE 1 TABLET BY MOUTH TWICE A DAY WITH A  MEAL., Disp: 180 tablet, Rfl: 1 .  methylPREDNISolone (MEDROL DOSEPAK) 4 MG TBPK tablet, Take 6 pills on day one then decrease by 1 pill each day, Disp: 21 tablet, Rfl: 0 .  montelukast (SINGULAIR) 10 MG tablet, Take 1 tablet (10 mg total) by mouth at bedtime. For allergies, Disp: 30 tablet, Rfl: 11 .  Multiple Vitamin (MULTIVITAMIN) tablet, Take 1 tablet by mouth daily., Disp: , Rfl:  .  naproxen sodium (ANAPROX) 220 MG tablet, Take 220 mg by mouth as needed (pain). Reported on 05/06/2016, Disp: , Rfl:  .  pravastatin (PRAVACHOL) 40 MG tablet, Take 1 tablet (40 mg total) by  mouth daily., Disp: 90 tablet, Rfl: 1 .  sertraline (ZOLOFT) 100 MG tablet, Take 1 tablet (100 mg total) by mouth daily., Disp: 90 tablet, Rfl: 1 .  sitaGLIPtin (JANUVIA) 100 MG tablet, Take 1 tablet (100 mg total) by mouth daily., Disp: 90 tablet, Rfl: 1 .  ACCU-CHEK FASTCLIX LANCETS MISC, CHECK BLOOD SUGAR 2 TO 3 TIMES DAILY, Disp: 300 each, Rfl: 12 .  glucose blood (ACCU-CHEK GUIDE) test strip, Check blood sugar 2-3 times daily (Patient not taking: Reported on 02/24/2018), Disp: 300 each, Rfl: 12 .  levofloxacin (LEVAQUIN) 500 MG tablet, Take 1 tablet (500 mg total) by mouth daily. (Patient not taking: Reported on 12/15/2017), Disp: 7 tablet, Rfl: 0 .  ondansetron (ZOFRAN ODT) 4 MG disintegrating tablet, Take 1 tablet (4 mg total) by mouth every 8 (eight) hours as needed for nausea or vomiting., Disp: 20 tablet, Rfl: 0  Review of Systems  Constitutional: Positive for fatigue. Negative for activity change, appetite change, chills, diaphoresis, fever and unexpected weight change.  HENT: Positive for congestion, ear pain, postnasal drip, rhinorrhea, sinus pressure and sinus pain. Negative for ear discharge, sneezing, sore throat, tinnitus, trouble swallowing and voice change.   Eyes: Negative.   Respiratory: Positive for cough. Negative for apnea, choking, chest tightness, shortness of breath, wheezing and stridor.   Gastrointestinal: Positive for diarrhea and vomiting. Negative for abdominal distention, abdominal pain, anal bleeding, blood in stool, constipation, nausea and rectal pain.  Neurological: Negative for dizziness, light-headedness and headaches.    Social History   Tobacco Use  . Smoking status: Former Smoker    Packs/day: 1.50    Years: 25.00    Pack years: 37.50    Types: Cigarettes  . Smokeless tobacco: Never Used  Substance Use Topics  . Alcohol use: Yes    Alcohol/week: 0.0 oz    Comment: 1- every 3-4 months   Objective:   BP 138/86 (BP Location: Right Arm, Patient  Position: Sitting, Cuff Size: Large)   Pulse 83   Temp 98.2 F (36.8 C) (Oral)   Resp 16   Wt 206 lb (93.4 kg)   SpO2 97%   BMI 33.25 kg/m  Vitals:   03/08/18 1410  BP: 138/86  Pulse: 83  Resp: 16  Temp: 98.2 F (36.8 C)  TempSrc: Oral  SpO2: 97%  Weight: 206 lb (93.4 kg)     Physical Exam  Constitutional: She is oriented to person, place, and time. She appears well-developed and well-nourished.  HENT:  Right Ear: External ear normal.  Left Ear: External ear normal.  Cardiovascular: Normal rate and regular rhythm.  Pulmonary/Chest: Effort normal and breath sounds normal.  Abdominal: Soft. Bowel sounds are normal. She exhibits no distension. There is no tenderness. There is no rebound and no guarding.  Neurological: She is alert and oriented to person, place, and time.  Skin: Skin is warm and dry.  Psychiatric: She has a normal mood and affect. Her behavior is normal.        Assessment & Plan:     1. Nausea vomiting and diarrhea  Do think this is a side effect from Ozempic, could be GI illness. Counseled patient she may do trial of skipping next dose to see if side effects subside. She does not want to do this because she has hip surgery scheduled in April and her A1c needs to go down. Counseled on maintaining appropriate diabetic diet which does not include gravy biscuits or chicken pie. Could be viral GI infection as well. Zofran given for nausea and vomiting.   - ondansetron (ZOFRAN ODT) 4 MG disintegrating tablet; Take 1 tablet (4 mg total) by mouth every 8 (eight) hours as needed for nausea or vomiting.  Dispense: 20 tablet; Refill: 0  2. Type 2 diabetes mellitus with stage 2 chronic kidney disease, without long-term current use of insulin (HCC)  Uncontrolled, just started Ozempic which may be contributing to her symptoms. See #1.  Return if symptoms worsen or fail to improve.  The entirety of the information documented in the History of Present Illness, Review  of Systems and Physical Exam were personally obtained by me. Portions of this information were initially documented by Ashley Royalty, CMA and reviewed by me for thoroughness and accuracy.          Trinna Post, PA-C  [facility]

## 2018-03-08 NOTE — Patient Instructions (Signed)
Vomiting, Adult  Vomiting occurs when stomach contents are thrown up and out of the mouth. Many people notice nausea before vomiting. Vomiting can make you feel weak and dehydrated. Dehydration can make you tired and thirsty, cause you to have a dry mouth, and decrease how often you urinate. Older adults and people who have other diseases or a weak immune system are at higher risk for dehydration.It is important to treat vomiting as told by your health care provider.  Follow these instructions at home:  Follow your health care provider's instructions about how to care for yourself at home.  Eating and drinking  Follow these recommendations as told by your health care provider:   Take an oral rehydration solution (ORS). This is a drink that is sold at pharmacies and retail stores.   Eat bland, easy-to-digest foods in small amounts as you are able. These foods include bananas, applesauce, rice, lean meats, toast, and crackers.   Drink clear fluids in small amounts as you are able. Clear fluids include water, ice chips, low-calorie sports drinks, and fruit juice that has water added (diluted fruit juice).   Avoid fluids that contain a lot of sugar or caffeine.   Avoid alcohol and foods that are spicy or fatty.    General instructions     Wash your hands frequently with soap and water. If soap and water are not available, use hand sanitizer. Make sure that everyone in your household washes their hands frequently.   Take over-the-counter and prescription medicines only as told by your health care provider.   Watch your condition for any changes.   Keep all follow-up visits as told by your health care provider. This is important.  Contact a health care provider if:   You have a fever.   You are not able to keep fluids down.   Your vomiting gets worse.   You have new symptoms.   You feel light-headed or dizzy.   You have a headache.   You have muscle cramps.  Get help right away if:   You have pain in  your chest, neck, arm, or jaw.   You feel extremely weak or you faint.   You have persistent vomiting.   You have vomit that is bright red or looks like black coffee grounds.   You have stools that are bloody or black, or stools that look like tar.   You have severe pain, cramping, or bloating in your abdomen.   You have a severe headache, a stiff neck, or both.   You have a rash.   You have trouble breathing or you are breathing very quickly.   Your heart is beating very quickly.   Your skin feels cold and clammy.   You feel confused.   You have pain while urinating.   You have signs of dehydration, such as:  ? Dark urine, or very little or no urine.  ? Cracked lips.  ? Dry mouth.  ? Sunken eyes.  ? Sleepiness.  ? Weakness.  These symptoms may represent a serious problem that is an emergency. Do not wait to see if the symptoms will go away. Get medical help right away. Call your local emergency services (911 in the [place] Do not drive yourself to the hospital.  This information is not intended to replace advice given to you by your health care provider. Make sure you discuss any questions you have with your health care provider.  Document Released: 12/27/2015 Document Revised: 05/07/2016 Document   Reviewed: 08/06/2015  Elsevier Interactive Patient Education  2018 [employer]

## 2018-03-11 ENCOUNTER — Encounter: Payer: 59 | Admitting: Physician Assistant

## 2018-03-23 ENCOUNTER — Other Ambulatory Visit: Payer: Self-pay

## 2018-03-23 ENCOUNTER — Encounter
Admission: RE | Admit: 2018-03-23 | Discharge: 2018-03-23 | Disposition: A | Discharge status: Home / Self Care | Payer: 59 | Source: Ambulatory Visit | Attending: Orthopedic Surgery | Admitting: Orthopedic Surgery

## 2018-03-23 DIAGNOSIS — R9431 Abnormal electrocardiogram [ECG] [EKG]: Secondary | ICD-10-CM | POA: Diagnosis not present

## 2018-03-23 DIAGNOSIS — Z01818 Encounter for other preprocedural examination: Secondary | ICD-10-CM | POA: Insufficient documentation

## 2018-03-23 DIAGNOSIS — I10 Essential (primary) hypertension: Secondary | ICD-10-CM | POA: Diagnosis not present

## 2018-03-23 DIAGNOSIS — Z01810 Encounter for preprocedural cardiovascular examination: Secondary | ICD-10-CM | POA: Diagnosis not present

## 2018-03-23 DIAGNOSIS — M1611 Unilateral primary osteoarthritis, right hip: Secondary | ICD-10-CM | POA: Insufficient documentation

## 2018-03-23 HISTORY — DX: Other allergic rhinitis: J30.89

## 2018-03-23 HISTORY — DX: Gastro-esophageal reflux disease without esophagitis: K21.9

## 2018-03-23 LAB — COMPREHENSIVE METABOLIC PANEL
ALT: 12 U/L — ABNORMAL LOW (ref 14–54)
AST: 17 U/L (ref 15–41)
Albumin: 4.3 g/dL (ref 3.5–5.0)
Alkaline Phosphatase: 61 U/L (ref 38–126)
Anion gap: 12 (ref 5–15)
BUN: 19 mg/dL (ref 6–20)
CO2: 25 mmol/L (ref 22–32)
Calcium: 9.4 mg/dL (ref 8.9–10.3)
Chloride: 98 mmol/L — ABNORMAL LOW (ref 101–111)
Creatinine, Ser: 0.73 mg/dL (ref 0.44–1.00)
GFR calc Af Amer: >60 mL/min (ref >60)
GFR calc non Af Amer: >60 mL/min (ref >60)
Glucose, Bld: 113 mg/dL — ABNORMAL HIGH (ref 65–99)
Potassium: 3.6 mmol/L (ref 3.5–5.1)
Sodium: 135 mmol/L (ref 135–145)
Total Bilirubin: 0.7 mg/dL (ref 0.3–1.2)
Total Protein: 8.5 g/dL — ABNORMAL HIGH (ref 6.5–8.1)

## 2018-03-23 LAB — TYPE AND SCREEN
ABO/RH(D): O POS
Antibody Screen: NEGATIVE

## 2018-03-23 LAB — URINALYSIS, ROUTINE W REFLEX MICROSCOPIC
Bilirubin Urine: NEGATIVE
Glucose, UA: NEGATIVE mg/dL
Ketones, ur: NEGATIVE mg/dL
Nitrite: NEGATIVE
Protein, ur: NEGATIVE mg/dL
Specific Gravity, Urine: 1.013 (ref 1.005–1.030)
pH: 5 (ref 5.0–8.0)

## 2018-03-23 LAB — CBC
HCT: 41.4 % (ref 35.0–47.0)
Hemoglobin: 14.3 g/dL (ref 12.0–16.0)
MCH: 28.8 pg (ref 26.0–34.0)
MCHC: 34.6 g/dL (ref 32.0–36.0)
MCV: 83.2 fL (ref 80.0–100.0)
Platelets: 277 10*3/uL (ref 150–440)
RBC: 4.97 MIL/uL (ref 3.80–5.20)
RDW: 12.8 % (ref 11.5–14.5)
WBC: 10.9 10*3/uL (ref 3.6–11.0)

## 2018-03-23 LAB — PROTIME-INR
INR: 0.94
Prothrombin Time: 12.5 seconds (ref 11.4–15.2)

## 2018-03-23 LAB — SEDIMENTATION RATE: Sed Rate: 24 mm/hr (ref 0–30)

## 2018-03-23 LAB — C-REACTIVE PROTEIN: CRP: <0.8 mg/dL (ref <1.0)

## 2018-03-23 LAB — APTT: aPTT: 32 seconds (ref 24–36)

## 2018-03-23 NOTE — Pre-Procedure Instructions (Signed)
Instructed regarding incentive spirometry. 

## 2018-03-23 NOTE — Patient Instructions (Signed)
Your procedure is scheduled on: 04/04/18 Mon Report to Same Day Surgery 2nd floor [facility] Entrance-take elevator on left to 2nd floor.  Check in with surgery information desk.) To find out your arrival time please call [phone] between 1PM - 3PM on 4/19/`9 Fri  Remember: Instructions that are not followed completely may result in serious medical risk, up to and including death, or upon the discretion of your surgeon and anesthesiologist your surgery may need to be rescheduled.    _x___ 1. Do not eat food after midnight the night before your procedure. You may drink clear liquids up to 2 hours before you are scheduled to arrive at the hospital for your procedure.  Do not drink clear liquids within 2 hours of your scheduled arrival to the hospital.  Clear liquids include  --Water or Apple juice without pulp  --Clear carbohydrate beverage such as ClearFast or Gatorade  --Black Coffee or Clear Tea (No milk, no creamers, do not add anything to                  the coffee or Tea Type 1 and type 2 diabetics should only drink water.  No gum chewing or hard candies.     __x__ 2. No Alcohol for 24 hours before or after surgery.   __x__3. No Smoking or e-cigarettes for 24 prior to surgery.  Do not use any chewable tobacco products for at least 6 hour prior to surgery   ____  4. Bring all medications with you on the day of surgery if instructed.    __x__ 5. Notify your doctor if there is any change in your medical condition     (cold, fever, infections).    x___6. On the morning of surgery brush your teeth with toothpaste and water.  You may rinse your mouth with mouth wash if you wish.  Do not swallow any toothpaste or mouthwash.   Do not wear jewelry, make-up, hairpins, clips or nail polish.  Do not wear lotions, powders, or perfumes. You may wear deodorant.  Do not shave 48 hours prior to surgery. Men may shave face and neck.  Do not bring valuables to the hospital.     [facility] is not responsible for any belongings or valuables.               Contacts, dentures or bridgework may not be worn into surgery.  Leave your suitcase in the car. After surgery it may be brought to your room.  For patients admitted to the hospital, discharge time is determined by your                       treatment team.  _  Patients discharged the day of surgery will not be allowed to drive home.  You will need someone to drive you home and stay with you the night of your procedure.    Please read over the following fact sheets that you were given:   [facility] Preparing for Surgery and or MRSA Information   _x___ Take anti-hypertensive listed below, cardiac, seizure, asthma,     anti-reflux and psychiatric medicines. These include:  1. pravastatin (PRAVACHOL) 40 MG tablet  2.ALPRAZolam (XANAX) 0.5 MG tablet  3.  4.  5.  6.  ____Fleets enema or Magnesium Citrate as directed.   _x___ Use CHG Soap or sage wipes as directed on instruction sheet   ____ Use inhalers on the day of surgery  and bring to hospital day of surgery  ____ Stop Metformin and Janumet 2 days prior to surgery.    ____ Take 1/2 of usual insulin dose the night before surgery and none on the morning     surgery.   _x___ Follow recommendations from Cardiologist, Pulmonologist or PCP regarding          stopping Aspirin, Coumadin, Plavix ,Eliquis, Effient, or Pradaxa, and Pletal.  Stop Aspirin 1 week before surgery if OK with physician  X____Stop Anti-inflammatories such as Advil, Aleve, Ibuprofen, Motrin, Naproxen, Naprosyn, Goodies powders or aspirin products. OK to take Tylenol and                          Celebrex.   _x___ Stop supplements until after surgery.  But may continue Vitamin D, Vitamin B,       and multivitamin.   ____ Bring C-Pap to the hospital.

## 2018-03-24 ENCOUNTER — Encounter: Payer: Self-pay | Admitting: Physician Assistant

## 2018-03-24 ENCOUNTER — Ambulatory Visit (INDEPENDENT_AMBULATORY_CARE_PROVIDER_SITE_OTHER): Payer: 59 | Admitting: Physician Assistant

## 2018-03-24 VITALS — BP 136/86 | HR 84 | Temp 97.8°F | Resp 16 | Ht 66.0 in | Wt 206.8 lb

## 2018-03-24 DIAGNOSIS — Z0000 Encounter for general adult medical examination without abnormal findings: Secondary | ICD-10-CM

## 2018-03-24 DIAGNOSIS — E7800 Pure hypercholesterolemia, unspecified: Secondary | ICD-10-CM

## 2018-03-24 DIAGNOSIS — E1122 Type 2 diabetes mellitus with diabetic chronic kidney disease: Secondary | ICD-10-CM

## 2018-03-24 DIAGNOSIS — Z114 Encounter for screening for human immunodeficiency virus [HIV]: Secondary | ICD-10-CM | POA: Diagnosis not present

## 2018-03-24 DIAGNOSIS — N182 Chronic kidney disease, stage 2 (mild): Secondary | ICD-10-CM | POA: Diagnosis not present

## 2018-03-24 DIAGNOSIS — I10 Essential (primary) hypertension: Secondary | ICD-10-CM | POA: Diagnosis not present

## 2018-03-24 LAB — SURGICAL PCR SCREEN
MRSA, PCR: POSITIVE — AB
Staphylococcus aureus: POSITIVE — AB

## 2018-03-24 NOTE — Patient Instructions (Signed)
Health Maintenance for Postmenopausal Women Menopause is a normal process in which your reproductive ability comes to an end. This process happens gradually over a span of months to years, usually between the ages of 22 and 9. Menopause is complete when you have missed 12 consecutive menstrual periods. It is important to talk with your health care provider about some of the most common conditions that affect postmenopausal women, such as heart disease, cancer, and bone loss (osteoporosis). Adopting a healthy lifestyle and getting preventive care can help to promote your health and wellness. Those actions can also lower your chances of developing some of these common conditions. What should I know about menopause? During menopause, you may experience a number of symptoms, such as:  Moderate-to-severe hot flashes.  Night sweats.  Decrease in sex drive.  Mood swings.  Headaches.  Tiredness.  Irritability.  Memory problems.  Insomnia.  Choosing to treat or not to treat menopausal changes is an individual decision that you make with your health care provider. What should I know about hormone replacement therapy and supplements? Hormone therapy products are effective for treating symptoms that are associated with menopause, such as hot flashes and night sweats. Hormone replacement carries certain risks, especially as you become older. If you are thinking about using estrogen or estrogen with progestin treatments, discuss the benefits and risks with your health care provider. What should I know about heart disease and stroke? Heart disease, heart attack, and stroke become more likely as you age. This may be due, in part, to the hormonal changes that your body experiences during menopause. These can affect how your body processes dietary fats, triglycerides, and cholesterol. Heart attack and stroke are both medical emergencies. There are many things that you can do to help prevent heart disease  and stroke:  Have your blood pressure checked at least every 1-2 years. High blood pressure causes heart disease and increases the risk of stroke.  If you are 53-22 years old, ask your health care provider if you should take aspirin to prevent a heart attack or a stroke.  Do not use any tobacco products, including cigarettes, chewing tobacco, or electronic cigarettes. If you need help quitting, ask your health care provider.  It is important to eat a healthy diet and maintain a healthy weight. ? Be sure to include plenty of vegetables, fruits, low-fat dairy products, and lean protein. ? Avoid eating foods that are high in solid fats, added sugars, or salt (sodium).  Get regular exercise. This is one of the most important things that you can do for your health. ? Try to exercise for at least 150 minutes each week. The type of exercise that you do should increase your heart rate and make you sweat. This is known as moderate-intensity exercise. ? Try to do strengthening exercises at least twice each week. Do these in addition to the moderate-intensity exercise.  Know your numbers.Ask your health care provider to check your cholesterol and your blood glucose. Continue to have your blood tested as directed by your health care provider.  What should I know about cancer screening? There are several types of cancer. Take the following steps to reduce your risk and to catch any cancer development as early as possible. Breast Cancer  Practice breast self-awareness. ? This means understanding how your breasts normally appear and feel. ? It also means doing regular breast self-exams. Let your health care provider know about any changes, no matter how small.  If you are 40  or older, have a clinician do a breast exam (clinical breast exam or CBE) every year. Depending on your age, family history, and medical history, it may be recommended that you also have a yearly breast X-ray (mammogram).  If you  have a family history of breast cancer, talk with your health care provider about genetic screening.  If you are at high risk for breast cancer, talk with your health care provider about having an MRI and a mammogram every year.  Breast cancer (BRCA) gene test is recommended for women who have family members with BRCA-related cancers. Results of the assessment will determine the need for genetic counseling and BRCA1 and for BRCA2 testing. BRCA-related cancers include these types: ? Breast. This occurs in males or females. ? Ovarian. ? Tubal. This may also be called fallopian tube cancer. ? Cancer of the abdominal or pelvic lining (peritoneal cancer). ? Prostate. ? Pancreatic.  Cervical, Uterine, and Ovarian Cancer Your health care provider may recommend that you be screened regularly for cancer of the pelvic organs. These include your ovaries, uterus, and vagina. This screening involves a pelvic exam, which includes checking for microscopic changes to the surface of your cervix (Pap test).  For women ages 21-65, health care providers may recommend a pelvic exam and a Pap test every three years. For women ages 79-65, they may recommend the Pap test and pelvic exam, combined with testing for human papilloma virus (HPV), every five years. Some types of HPV increase your risk of cervical cancer. Testing for HPV may also be done on women of any age who have unclear Pap test results.  Other health care providers may not recommend any screening for nonpregnant women who are considered low risk for pelvic cancer and have no symptoms. Ask your health care provider if a screening pelvic exam is right for you.  If you have had past treatment for cervical cancer or a condition that could lead to cancer, you need Pap tests and screening for cancer for at least 20 years after your treatment. If Pap tests have been discontinued for you, your risk factors (such as having a new sexual partner) need to be  reassessed to determine if you should start having screenings again. Some women have medical problems that increase the chance of getting cervical cancer. In these cases, your health care provider may recommend that you have screening and Pap tests more often.  If you have a family history of uterine cancer or ovarian cancer, talk with your health care provider about genetic screening.  If you have vaginal bleeding after reaching menopause, tell your health care provider.  There are currently no reliable tests available to screen for ovarian cancer.  Lung Cancer Lung cancer screening is recommended for adults 69-62 years old who are at high risk for lung cancer because of a history of smoking. A yearly low-dose CT scan of the lungs is recommended if you:  Currently smoke.  Have a history of at least 30 pack-years of smoking and you currently smoke or have quit within the past 15 years. A pack-year is smoking an average of one pack of cigarettes per day for one year.  Yearly screening should:  Continue until it has been 15 years since you quit.  Stop if you develop a health problem that would prevent you from having lung cancer treatment.  Colorectal Cancer  This type of cancer can be detected and can often be prevented.  Routine colorectal cancer screening usually begins at  age 42 and continues through age 45.  If you have risk factors for colon cancer, your health care provider may recommend that you be screened at an earlier age.  If you have a family history of colorectal cancer, talk with your health care provider about genetic screening.  Your health care provider may also recommend using home test kits to check for hidden blood in your stool.  A small camera at the end of a tube can be used to examine your colon directly (sigmoidoscopy or colonoscopy). This is done to check for the earliest forms of colorectal cancer.  Direct examination of the colon should be repeated every  5-10 years until age 71. However, if early forms of precancerous polyps or small growths are found or if you have a family history or genetic risk for colorectal cancer, you may need to be screened more often.  Skin Cancer  Check your skin from head to toe regularly.  Monitor any moles. Be sure to tell your health care provider: ? About any new moles or changes in moles, especially if there is a change in a mole's shape or color. ? If you have a mole that is larger than the size of a pencil eraser.  If any of your family members has a history of skin cancer, especially at a young age, talk with your health care provider about genetic screening.  Always use sunscreen. Apply sunscreen liberally and repeatedly throughout the day.  Whenever you are outside, protect yourself by wearing long sleeves, pants, a wide-brimmed hat, and sunglasses.  What should I know about osteoporosis? Osteoporosis is a condition in which bone destruction happens more quickly than new bone creation. After menopause, you may be at an increased risk for osteoporosis. To help prevent osteoporosis or the bone fractures that can happen because of osteoporosis, the following is recommended:  If you are 46-71 years old, get at least 1,000 mg of calcium and at least 600 mg of vitamin D per day.  If you are older than age 55 but younger than age 65, get at least 1,200 mg of calcium and at least 600 mg of vitamin D per day.  If you are older than age 54, get at least 1,200 mg of calcium and at least 800 mg of vitamin D per day.  Smoking and excessive alcohol intake increase the risk of osteoporosis. Eat foods that are rich in calcium and vitamin D, and do weight-bearing exercises several times each week as directed by your health care provider. What should I know about how menopause affects my mental health? Depression may occur at any age, but it is more common as you become older. Common symptoms of depression  include:  Low or sad mood.  Changes in sleep patterns.  Changes in appetite or eating patterns.  Feeling an overall lack of motivation or enjoyment of activities that you previously enjoyed.  Frequent crying spells.  Talk with your health care provider if you think that you are experiencing depression. What should I know about immunizations? It is important that you get and maintain your immunizations. These include:  Tetanus, diphtheria, and pertussis (Tdap) booster vaccine.  Influenza every year before the flu season begins.  Pneumonia vaccine.  Shingles vaccine.  Your health care provider may also recommend other immunizations. This information is not intended to replace advice given to you by your health care provider. Make sure you discuss any questions you have with your health care provider. Document Released: 01/22/2006  Document Revised: 06/19/2016 Document Reviewed: 09/03/2015 Elsevier Interactive Patient Education  2018 Elsevier Inc.  

## 2018-03-24 NOTE — Pre-Procedure Instructions (Addendum)
ekg compared with 2017 UA FAXED TO OFFICE

## 2018-03-24 NOTE — Progress Notes (Signed)
Patient: [name], Female    DOB: 1956-06-10, 62 y.o.   MRN: [id number] Visit Date: 03/24/2018  Today's Provider: Mar Daring, PA-C   Chief Complaint  Patient presents with  . Annual Exam   Subjective:    Annual physical exam [name] is a 62 y.o. female who presents today for health maintenance and complete physical. She feels fairly well. She reports exercising none. She reports she is sleeping fairly well.  11/10/16 CPE 11/10/16 Pap-negative 01/05/18 Mammogram-BI-RADS 1 -----------------------------------------------------------------   Review of Systems  Constitutional: Negative.   HENT: Negative.   Eyes: Negative.   Respiratory: Negative.   Cardiovascular: Negative.   Gastrointestinal: Negative.   Endocrine: Negative.   Genitourinary: Negative.   Musculoskeletal: Positive for arthralgias (right hip pain).  Skin: Negative.   Allergic/Immunologic: Negative.   Neurological: Negative.   Hematological: Negative.   Psychiatric/Behavioral: Negative.     Social History      She  reports that she quit smoking about 11 years ago. Her smoking use included cigarettes. She has a 37.50 pack-year smoking history. She has never used smokeless tobacco. She reports that she drinks alcohol. She reports that she does not use drugs.       Social History   Socioeconomic History  . Marital status: Widowed    Spouse name: Not on file  . Number of children: Not on file  . Years of education: Not on file  . Highest education level: Not on file  Occupational History    Employer: [employer]  . Financial resource strain: Not on file  . Food insecurity:    Worry: Not on file    Inability: Not on file  . Transportation needs:    Medical: Not on file    Non-medical: Not on file  Tobacco Use  . Smoking status: Former Smoker    Packs/day: 1.50    Years: 25.00    Pack years: 37.50    Types: Cigarettes    Last attempt to quit: 03/24/2007     Years since quitting: 11.0  . Smokeless tobacco: Never Used  Substance and Sexual Activity  . Alcohol use: Yes    Alcohol/week: 0.0 oz    Comment: 1- every 3-4 months  . Drug use: No  . Sexual activity: Not on file  Lifestyle  . Physical activity:    Days per week: Not on file    Minutes per session: Not on file  . Stress: Not on file  Relationships  . Social connections:    Talks on phone: Not on file    Gets together: Not on file    Attends religious service: Not on file    Active member of club or organization: Not on file    Attends meetings of clubs or organizations: Not on file    Relationship status: Not on file  Other Topics Concern  . Not on file  Social History Narrative  . Not on file    Past Medical History:  Diagnosis Date  . Anxiety   . Arthritis   . Cataract    left  . Depression   . Diabetes mellitus without complication ([place])   . Environmental and seasonal allergies   . GERD (gastroesophageal reflux disease)   . Hyperlipidemia   . Hypertension   . Joint pain    as reported by patient  . Urinary incontinence    as stated by patient  Patient Active Problem List   Diagnosis Date Noted  . Allergic rhinitis 10/28/2015  . Acute stress disorder 08/01/2015  . Anxiety 08/01/2015  . Clinical depression 08/01/2015  . Diabetes ([place]) 08/01/2015  . Diverticulosis of colon 08/01/2015  . Generalized pruritus 08/01/2015  . BP (high blood pressure) 08/01/2015  . Cannot sleep 08/01/2015  . Adiposity 08/01/2015  . Detrusor muscle hypertonia 08/01/2015  . Hypercholesterolemia without hypertriglyceridemia 08/01/2015  . Female stress incontinence 08/01/2015  . Avitaminosis D 08/01/2015    Past Surgical History:  Procedure Laterality Date  . BREAST EXCISIONAL BIOPSY Right 1999  . BREAST LUMPECTOMY Right 2005   as reported by patient  . CATARACT EXTRACTION  January 2013  . CATARACT EXTRACTION W/PHACO Left 12/02/2016   Procedure: CATARACT  EXTRACTION PHACO AND INTRAOCULAR LENS PLACEMENT (IOC);  Surgeon: Estill Cotta, MD;  Location: [facility] ORS;  Service: Ophthalmology;  Laterality: Left;  [place] 2:14 AP% 28.4 CDE 59.73 Fluid pack lot # [id number]  . DIAGNOSTIC LAPAROSCOPY      Family History        Family Status  Relation Name Status  . Brother  Alive  . Mother  Deceased  . Father  Deceased  . PGM  Deceased  . PGF  Deceased  . MGM  Deceased  . MGF  Deceased  . Daughter  Alive  . Son  Alive        Her family history includes Alzheimer's disease in her paternal grandmother; Aneurysm in her mother; CAD in her father; Cancer in her father; Colon cancer in her father; Dementia in her paternal grandmother; Diabetes in her father; Healthy in her brother, daughter, and son; Heart failure in her father; Mental illness in her paternal grandfather.      Allergies  Allergen Reactions  . Jardiance [Empagliflozin] Itching    Recurrent mycotic infections  . Penicillins Rash and Other (See Comments)    Has patient had a PCN reaction causing immediate rash, facial/tongue/throat swelling, SOB or lightheadedness with hypotension: No Has patient had a PCN reaction causing severe rash involving mucus membranes or skin necrosis: No Has patient had a PCN reaction that required hospitalization No Has patient had a PCN reaction occurring within the last 10 years: No If all of the above answers are "NO", then may proceed with Cephalosporin use.      Current Outpatient Medications:  .  ALPRAZolam (XANAX) 0.5 MG tablet, TAKE 1/2 TO 1 TABLET BY MOUTH BEFORE WORK AND TAKE 1 TABLET EVERY 8 HOURS AS NEEDED (Patient taking differently: TAKE 0.5 MG EVERY 8 HOURS AS NEEDED FOR ANXIETY), Disp: 30 tablet, Rfl: 5 .  aspirin 81 MG tablet, Take 81 mg by mouth daily., Disp: , Rfl:  .  glipiZIDE (GLUCOTROL XL) 10 MG 24 hr tablet, TAKE 1 TABLET BY MOUTH DAILY WITH BREAKFAST. (Patient taking differently: TAKE 10 MG BY MOUTH DAILY WITH BREAKFAST.), Disp: 90  tablet, Rfl: 1 .  hydrochlorothiazide (HYDRODIURIL) 25 MG tablet, TAKE 1 TABLET (25 MG TOTAL) BY MOUTH DAILY., Disp: 90 tablet, Rfl: 1 .  losartan (COZAAR) 25 MG tablet, TAKE 1 TABLET BY MOUTH TWICE DAILY (Patient taking differently: TAKE 25 MG BY MOUTH TWICE DAILY), Disp: 180 tablet, Rfl: 1 .  metFORMIN (GLUCOPHAGE) 1000 MG tablet, TAKE 1 TABLET BY MOUTH TWICE A DAY WITH A MEAL. (Patient taking differently: TAKE 1000 MG BY MOUTH TWICE A DAY WITH A MEAL.), Disp: 180 tablet, Rfl: 1 .  Multiple Vitamin (MULTIVITAMIN) tablet, Take 1 tablet by mouth daily., Disp: ,  Rfl:  .  naproxen sodium (ANAPROX) 220 MG tablet, Take 440 mg by mouth 2 (two) times daily as needed (for pain or headache). , Disp: , Rfl:  .  pravastatin (PRAVACHOL) 40 MG tablet, Take 1 tablet (40 mg total) by mouth daily., Disp: 90 tablet, Rfl: 1 .  sitaGLIPtin (JANUVIA) 100 MG tablet, Take 1 tablet (100 mg total) by mouth daily., Disp: 90 tablet, Rfl: 1   Patient Care Team: Mar Daring, PA-C as PCP - General (Physician Assistant)      Objective:   Vitals: BP 136/86 (BP Location: Left Arm, Patient Position: Sitting, Cuff Size: Normal)   Pulse 84   Temp 97.8 F (36.6 C) (Oral)   Resp 16   Ht 5\' 6"  (1.676 m)   Wt 206 lb 12.8 oz (93.8 kg)   SpO2 96%   BMI 33.38 kg/m    Vitals:   03/24/18 1508  BP: 136/86  Pulse: 84  Resp: 16  Temp: 97.8 F (36.6 C)  TempSrc: Oral  SpO2: 96%  Weight: 206 lb 12.8 oz (93.8 kg)  Height: 5\' 6"  (1.676 m)     Physical Exam  Constitutional: She is oriented to person, place, and time. She appears well-developed and well-nourished. No distress.  HENT:  Head: Normocephalic and atraumatic.  Right Ear: Hearing, tympanic membrane, external ear and ear canal normal.  Left Ear: Hearing, tympanic membrane, external ear and ear canal normal.  Nose: Nose normal.  Mouth/Throat: Uvula is midline, oropharynx is clear and moist and mucous membranes are normal. Abnormal dentition. No  oropharyngeal exudate.  Eyes: Pupils are equal, round, and reactive to light. Conjunctivae and EOM are normal. Right eye exhibits no discharge. Left eye exhibits no discharge. No scleral icterus.  Neck: Normal range of motion. Neck supple. No JVD present. Carotid bruit is not present. No tracheal deviation present. No thyromegaly present.  Cardiovascular: Normal rate, regular rhythm, normal heart sounds and intact distal pulses. Exam reveals no gallop and no friction rub.  No murmur heard. Pulses:      Dorsalis pedis pulses are 2+ on the right side, and 2+ on the left side.       Posterior tibial pulses are 2+ on the right side, and 2+ on the left side.  Pulmonary/Chest: Effort normal and breath sounds normal. No respiratory distress. She has no wheezes. She has no rales. She exhibits no tenderness.  Abdominal: Soft. Bowel sounds are normal. She exhibits no distension and no mass. There is no tenderness. There is no rebound and no guarding.  Musculoskeletal: Normal range of motion. She exhibits no edema or tenderness.       Right foot: There is normal range of motion and no deformity.       Left foot: There is normal range of motion and no deformity.  Feet:  Right Foot:  Protective Sensation: 10 sites tested. 10 sites sensed.  Skin Integrity: Negative for ulcer, blister, skin breakdown, erythema, warmth, callus or dry skin.  Left Foot:  Protective Sensation: 10 sites tested. 10 sites sensed.  Skin Integrity: Negative for ulcer, blister, skin breakdown, erythema, warmth, callus or dry skin.  Lymphadenopathy:    She has no cervical adenopathy.  Neurological: She is alert and oriented to person, place, and time.  Skin: Skin is warm and dry. No rash noted. She is not diaphoretic.  Psychiatric: She has a normal mood and affect. Her behavior is normal. Judgment and thought content normal.  Vitals reviewed.    Depression  Screen PHQ 2/9 Scores 03/24/2018 07/15/2017 10/26/2016 09/21/2016  PHQ - 2  Score 2 2 6  0  PHQ- 9 Score 4 4 7  -      Assessment & Plan:     Routine Health Maintenance and Physical Exam  Exercise Activities and Dietary recommendations Goals    None      Immunization History  Administered Date(s) Administered  . Influenza,inj,Quad PF,6+ Mos 08/29/2015  . Pneumococcal Polysaccharide-23 11/22/2012    Health Maintenance  Topic Date Due  . FOOT EXAM  11/30/1964  . HIV Screening  11/30/1969  . TETANUS/TDAP  11/30/1973  . COLONOSCOPY  11/30/2004  . OPHTHALMOLOGY EXAM  09/17/2017  . PNEUMOCOCCAL POLYSACCHARIDE VACCINE (2) 11/22/2017  . INFLUENZA VACCINE  07/14/2018  . HEMOGLOBIN A1C  08/27/2018  . PAP SMEAR  11/11/2019  . MAMMOGRAM  01/06/2020  . Hepatitis C Screening  Completed     Discussed health benefits of physical activity, and encouraged her to engage in regular exercise appropriate for her age and condition.    1. Annual physical exam Normal physical exam today. Will check labs as below and f/u pending lab results. If labs are stable and WNL she will not need to have these rechecked for one year at her next annual physical exam. She is to call the office in the meantime if she has any acute issue, questions or concerns.  2. Type 2 diabetes mellitus with stage 2 chronic kidney disease, without long-term current use of insulin ([place]) Stop Ozempic due to nausea. Will check labs as below and f/u pending results. I will se her back in 3 months for A1c recheck.  - Hemoglobin A1c - TSH  3. Essential hypertension Stable. Will check labs as below and f/u pending results. - TSH  4. Hypercholesterolemia Stable. Continue pravastatin 40mg . Will check labs as below and f/u pending results. - Lipid Panel With LDL/HDL Ratio - TSH  5. Encounter for screening for HIV  - HIV antibody (with reflex) --------------------------------------------------------------------    Mar Daring, PA-C  [place]

## 2018-03-25 ENCOUNTER — Telehealth: Payer: Self-pay

## 2018-03-25 LAB — LIPID PANEL WITH LDL/HDL RATIO
Cholesterol, Total: 165 mg/dL (ref 100–199)
HDL: 48 mg/dL (ref >39)
LDL Calculated: 79 mg/dL (ref 0–99)
LDl/HDL Ratio: 1.6 ratio (ref 0.0–3.2)
Triglycerides: 190 mg/dL — ABNORMAL HIGH (ref 0–149)
VLDL Cholesterol Cal: 38 mg/dL (ref 5–40)

## 2018-03-25 LAB — HEMOGLOBIN A1C
Est. average glucose Bld gHb Est-mCnc: 180 mg/dL
Hgb A1c MFr Bld: 7.9 % — ABNORMAL HIGH (ref 4.8–5.6)

## 2018-03-25 LAB — URINE CULTURE: Special Requests: NORMAL

## 2018-03-25 LAB — TSH: TSH: 5.81 u[IU]/mL — ABNORMAL HIGH (ref 0.450–4.500)

## 2018-03-25 LAB — HIV ANTIBODY (ROUTINE TESTING W REFLEX): HIV Screen 4th Generation wRfx: NONREACTIVE

## 2018-03-25 NOTE — Telephone Encounter (Signed)
lmtcb

## 2018-03-25 NOTE — Telephone Encounter (Signed)
Patient advised as below.  

## 2018-03-25 NOTE — Telephone Encounter (Signed)
-----   Message from Mar Daring, PA-C sent at 03/25/2018  1:16 PM EDT ----- A1c is down to 7.9 from 8.8. Cholesterol doing well. Thyroid stable. HIV negative.

## 2018-03-28 LAB — IGE: IgE (Immunoglobulin E), Serum: 42 IU/mL (ref 6–495)

## 2018-03-28 NOTE — Pre-Procedure Instructions (Signed)
LABS FAXED TO DR Marry Guan. LAB TRACKING IGE RESULTS

## 2018-04-03 MED ORDER — CLINDAMYCIN PHOSPHATE 900 MG/50ML IV SOLN: 900.0000 mg | INTRAVENOUS | Status: AC

## 2018-04-03 MED ORDER — TRANEXAMIC ACID 1000 MG/10ML IV SOLN
1000.0000 mg | INTRAVENOUS | Status: DC
  Filled 2018-04-03: qty 10

## 2018-04-04 ENCOUNTER — Inpatient Hospital Stay: Payer: 59

## 2018-04-04 ENCOUNTER — Encounter: Payer: Self-pay | Admitting: Orthopedic Surgery

## 2018-04-04 ENCOUNTER — Inpatient Hospital Stay: Payer: 59 | Admitting: Anesthesiology

## 2018-04-04 ENCOUNTER — Inpatient Hospital Stay
Admission: RE | Admit: 2018-04-04 | Discharge: 2018-04-06 | DRG: 470 | Disposition: A | Discharge status: Home Health | Payer: 59 | Source: Ambulatory Visit | Attending: Orthopedic Surgery | Admitting: Orthopedic Surgery

## 2018-04-04 ENCOUNTER — Other Ambulatory Visit: Payer: Self-pay

## 2018-04-04 ENCOUNTER — Encounter
Admission: RE | Disposition: A | Discharge status: Home Health | Payer: Self-pay | Source: Ambulatory Visit | Attending: Orthopedic Surgery

## 2018-04-04 DIAGNOSIS — E785 Hyperlipidemia, unspecified: Secondary | ICD-10-CM | POA: Diagnosis present

## 2018-04-04 DIAGNOSIS — M1611 Unilateral primary osteoarthritis, right hip: Principal | ICD-10-CM | POA: Diagnosis present

## 2018-04-04 DIAGNOSIS — E7800 Pure hypercholesterolemia, unspecified: Secondary | ICD-10-CM | POA: Diagnosis present

## 2018-04-04 DIAGNOSIS — Z888 Allergy status to other drugs, medicaments and biological substances status: Secondary | ICD-10-CM

## 2018-04-04 DIAGNOSIS — F329 Major depressive disorder, single episode, unspecified: Secondary | ICD-10-CM | POA: Diagnosis present

## 2018-04-04 DIAGNOSIS — Z471 Aftercare following joint replacement surgery: Secondary | ICD-10-CM | POA: Diagnosis not present

## 2018-04-04 DIAGNOSIS — I10 Essential (primary) hypertension: Secondary | ICD-10-CM | POA: Diagnosis present

## 2018-04-04 DIAGNOSIS — Z96649 Presence of unspecified artificial hip joint: Secondary | ICD-10-CM | POA: Diagnosis not present

## 2018-04-04 DIAGNOSIS — K219 Gastro-esophageal reflux disease without esophagitis: Secondary | ICD-10-CM | POA: Diagnosis present

## 2018-04-04 DIAGNOSIS — F419 Anxiety disorder, unspecified: Secondary | ICD-10-CM | POA: Diagnosis present

## 2018-04-04 DIAGNOSIS — E119 Type 2 diabetes mellitus without complications: Secondary | ICD-10-CM | POA: Diagnosis not present

## 2018-04-04 DIAGNOSIS — Z880 Allergy status to penicillin: Secondary | ICD-10-CM | POA: Diagnosis not present

## 2018-04-04 DIAGNOSIS — Z96641 Presence of right artificial hip joint: Secondary | ICD-10-CM | POA: Diagnosis not present

## 2018-04-04 HISTORY — PX: TOTAL HIP ARTHROPLASTY: SHX124

## 2018-04-04 LAB — GLUCOSE, CAPILLARY
Glucose-Capillary: 146 mg/dL — ABNORMAL HIGH (ref 65–99)
Glucose-Capillary: 217 mg/dL — ABNORMAL HIGH (ref 65–99)
Glucose-Capillary: 285 mg/dL — ABNORMAL HIGH (ref 65–99)
Glucose-Capillary: 268 mg/dL — ABNORMAL HIGH (ref 65–99)

## 2018-04-04 LAB — ABO/RH: ABO/RH(D): O POS

## 2018-04-04 SURGERY — ARTHROPLASTY, HIP, TOTAL,POSTERIOR APPROACH
Anesthesia: Spinal | Site: Hip | Laterality: Right | Wound class: Clean

## 2018-04-04 MED ORDER — FENTANYL CITRATE (PF) 100 MCG/2ML IJ SOLN
INTRAMUSCULAR | Status: DC | PRN
  Administered 2018-04-04: 50 ug via INTRAVENOUS

## 2018-04-04 MED ORDER — CELECOXIB 200 MG PO CAPS
200.0000 mg | ORAL_CAPSULE | Freq: Two times a day (BID) | ORAL | Status: DC
  Administered 2018-04-04 – 2018-04-06 (×4): 200 mg via ORAL
  Filled 2018-04-04 (×5): qty 1

## 2018-04-04 MED ORDER — FLEET ENEMA 7-19 GM/118ML RE ENEM: 1.0000 | ENEMA | Freq: Once | RECTAL | Status: DC | PRN

## 2018-04-04 MED ORDER — HYDROMORPHONE HCL 1 MG/ML IJ SOLN: 0.5000 mg | INTRAMUSCULAR | Status: DC | PRN

## 2018-04-04 MED ORDER — CELECOXIB 200 MG PO CAPS
ORAL_CAPSULE | ORAL | Status: AC
  Administered 2018-04-04: 400 mg
  Filled 2018-04-04: qty 2

## 2018-04-04 MED ORDER — ONDANSETRON HCL 4 MG/2ML IJ SOLN
INTRAMUSCULAR | Status: AC
  Filled 2018-04-04: qty 2

## 2018-04-04 MED ORDER — INSULIN ASPART 100 UNIT/ML ~~LOC~~ SOLN
0.0000 [IU] | Freq: Three times a day (TID) | SUBCUTANEOUS | Status: DC
  Administered 2018-04-05: 8 [IU] via SUBCUTANEOUS
  Administered 2018-04-05: 3 [IU] via SUBCUTANEOUS
  Administered 2018-04-05: 8 [IU] via SUBCUTANEOUS
  Administered 2018-04-06 (×2): 3 [IU] via SUBCUTANEOUS
  Filled 2018-04-04 (×5): qty 1

## 2018-04-04 MED ORDER — PHENOL 1.4 % MT LIQD
1.0000 | OROMUCOSAL | Status: DC | PRN
  Filled 2018-04-04: qty 177

## 2018-04-04 MED ORDER — VANCOMYCIN HCL 1000 MG IV SOLR
INTRAVENOUS | Status: DC | PRN
  Administered 2018-04-04: 1000 mg via INTRAVENOUS

## 2018-04-04 MED ORDER — MIDAZOLAM HCL 5 MG/5ML IJ SOLN
INTRAMUSCULAR | Status: DC | PRN
  Administered 2018-04-04 (×2): 1 mg via INTRAVENOUS

## 2018-04-04 MED ORDER — ALUM & MAG HYDROXIDE-SIMETH 200-200-20 MG/5ML PO SUSP: 30.0000 mL | ORAL | Status: DC | PRN

## 2018-04-04 MED ORDER — FERROUS SULFATE 325 (65 FE) MG PO TABS
325.0000 mg | ORAL_TABLET | Freq: Two times a day (BID) | ORAL | Status: DC
  Administered 2018-04-05 – 2018-04-06 (×3): 325 mg via ORAL
  Filled 2018-04-04 (×3): qty 1

## 2018-04-04 MED ORDER — DEXAMETHASONE SODIUM PHOSPHATE 10 MG/ML IJ SOLN
8.0000 mg | Freq: Once | INTRAMUSCULAR | Status: AC
  Administered 2018-04-04: 8 mg via INTRAVENOUS

## 2018-04-04 MED ORDER — KETAMINE HCL 50 MG/ML IJ SOLN
INTRAMUSCULAR | Status: AC
  Filled 2018-04-04: qty 10

## 2018-04-04 MED ORDER — DEXAMETHASONE SODIUM PHOSPHATE 10 MG/ML IJ SOLN
INTRAMUSCULAR | Status: AC
  Administered 2018-04-04: 8 mg via INTRAVENOUS
  Filled 2018-04-04: qty 1

## 2018-04-04 MED ORDER — ONDANSETRON HCL 4 MG/2ML IJ SOLN
INTRAMUSCULAR | Status: DC | PRN
  Administered 2018-04-04: 4 mg via INTRAVENOUS

## 2018-04-04 MED ORDER — VANCOMYCIN HCL IN DEXTROSE 1-5 GM/200ML-% IV SOLN
INTRAVENOUS | Status: AC
  Administered 2018-04-04: 1 g
  Filled 2018-04-04: qty 200

## 2018-04-04 MED ORDER — INSULIN ASPART 100 UNIT/ML ~~LOC~~ SOLN
4.0000 [IU] | Freq: Once | SUBCUTANEOUS | Status: AC
  Administered 2018-04-04: 4 [IU] via SUBCUTANEOUS

## 2018-04-04 MED ORDER — ENOXAPARIN SODIUM 30 MG/0.3ML ~~LOC~~ SOLN
30.0000 mg | Freq: Two times a day (BID) | SUBCUTANEOUS | Status: DC
  Administered 2018-04-05 – 2018-04-06 (×3): 30 mg via SUBCUTANEOUS
  Filled 2018-04-04 (×3): qty 0.3

## 2018-04-04 MED ORDER — BUPIVACAINE HCL (PF) 0.5 % IJ SOLN
INTRAMUSCULAR | Status: DC | PRN
  Administered 2018-04-04: 3 mL

## 2018-04-04 MED ORDER — ONDANSETRON HCL 4 MG PO TABS: 4.0000 mg | ORAL_TABLET | Freq: Four times a day (QID) | ORAL | Status: DC | PRN

## 2018-04-04 MED ORDER — METFORMIN HCL 500 MG PO TABS
1000.0000 mg | ORAL_TABLET | Freq: Two times a day (BID) | ORAL | Status: DC
  Administered 2018-04-05 – 2018-04-06 (×3): 1000 mg via ORAL
  Filled 2018-04-04 (×4): qty 2

## 2018-04-04 MED ORDER — LOSARTAN POTASSIUM 25 MG PO TABS
25.0000 mg | ORAL_TABLET | Freq: Two times a day (BID) | ORAL | Status: DC
  Administered 2018-04-04 – 2018-04-06 (×4): 25 mg via ORAL
  Filled 2018-04-04 (×4): qty 1

## 2018-04-04 MED ORDER — SODIUM CHLORIDE 0.9 % IV SOLN
INTRAVENOUS | Status: DC
  Administered 2018-04-04 (×2): via INTRAVENOUS

## 2018-04-04 MED ORDER — PROPOFOL 500 MG/50ML IV EMUL
INTRAVENOUS | Status: AC
  Filled 2018-04-04: qty 50

## 2018-04-04 MED ORDER — TRANEXAMIC ACID 1000 MG/10ML IV SOLN
1000.0000 mg | Freq: Once | INTRAVENOUS | Status: AC
  Administered 2018-04-04: 1000 mg via INTRAVENOUS
  Filled 2018-04-04: qty 10

## 2018-04-04 MED ORDER — ACETAMINOPHEN 325 MG PO TABS: 325.0000 mg | ORAL_TABLET | Freq: Four times a day (QID) | ORAL | Status: DC | PRN

## 2018-04-04 MED ORDER — PRAVASTATIN SODIUM 20 MG PO TABS
40.0000 mg | ORAL_TABLET | Freq: Every day | ORAL | Status: DC
  Administered 2018-04-04 – 2018-04-06 (×3): 40 mg via ORAL
  Filled 2018-04-04 (×3): qty 2

## 2018-04-04 MED ORDER — PANTOPRAZOLE SODIUM 40 MG PO TBEC
40.0000 mg | DELAYED_RELEASE_TABLET | Freq: Two times a day (BID) | ORAL | Status: DC
  Administered 2018-04-04 – 2018-04-06 (×4): 40 mg via ORAL
  Filled 2018-04-04 (×4): qty 1

## 2018-04-04 MED ORDER — OXYCODONE HCL 5 MG PO TABS: 5.0000 mg | ORAL_TABLET | ORAL | Status: DC | PRN

## 2018-04-04 MED ORDER — NEOMYCIN-POLYMYXIN B GU 40-200000 IR SOLN
Status: DC | PRN
  Administered 2018-04-04: 16 mL

## 2018-04-04 MED ORDER — ADULT MULTIVITAMIN W/MINERALS CH
1.0000 | ORAL_TABLET | Freq: Every day | ORAL | Status: DC
  Administered 2018-04-04 – 2018-04-06 (×3): 1 via ORAL
  Filled 2018-04-04 (×3): qty 1

## 2018-04-04 MED ORDER — ALPRAZOLAM 0.5 MG PO TABS
0.5000 mg | ORAL_TABLET | Freq: Three times a day (TID) | ORAL | Status: DC | PRN
  Administered 2018-04-04: 0.5 mg via ORAL
  Filled 2018-04-04: qty 1

## 2018-04-04 MED ORDER — CLINDAMYCIN PHOSPHATE 900 MG/50ML IV SOLN
INTRAVENOUS | Status: DC | PRN
  Administered 2018-04-04: 900 mg via INTRAVENOUS

## 2018-04-04 MED ORDER — CHLORHEXIDINE GLUCONATE 4 % EX LIQD: 60.0000 mL | Freq: Once | CUTANEOUS | Status: DC

## 2018-04-04 MED ORDER — TRANEXAMIC ACID 1000 MG/10ML IV SOLN
INTRAVENOUS | Status: DC | PRN
  Administered 2018-04-04: 1000 mg via INTRAVENOUS

## 2018-04-04 MED ORDER — ACETAMINOPHEN 10 MG/ML IV SOLN
1000.0000 mg | Freq: Four times a day (QID) | INTRAVENOUS | Status: AC
  Administered 2018-04-04 – 2018-04-05 (×3): 1000 mg via INTRAVENOUS
  Filled 2018-04-04 (×4): qty 100

## 2018-04-04 MED ORDER — FENTANYL CITRATE (PF) 100 MCG/2ML IJ SOLN
INTRAMUSCULAR | Status: AC
  Administered 2018-04-04: 25 ug via INTRAVENOUS
  Filled 2018-04-04: qty 2

## 2018-04-04 MED ORDER — PROPOFOL 500 MG/50ML IV EMUL
INTRAVENOUS | Status: DC | PRN
  Administered 2018-04-04: 50 ug/kg/min via INTRAVENOUS

## 2018-04-04 MED ORDER — INSULIN ASPART 100 UNIT/ML ~~LOC~~ SOLN
SUBCUTANEOUS | Status: AC
  Administered 2018-04-04: 4 [IU] via SUBCUTANEOUS
  Filled 2018-04-04: qty 1

## 2018-04-04 MED ORDER — METOCLOPRAMIDE HCL 10 MG PO TABS
5.0000 mg | ORAL_TABLET | Freq: Three times a day (TID) | ORAL | Status: DC | PRN
  Administered 2018-04-04: 10 mg via ORAL

## 2018-04-04 MED ORDER — PHENYLEPHRINE HCL 10 MG/ML IJ SOLN
INTRAMUSCULAR | Status: DC | PRN
  Administered 2018-04-04: 30 ug/min via INTRAVENOUS

## 2018-04-04 MED ORDER — CLINDAMYCIN PHOSPHATE 900 MG/50ML IV SOLN
INTRAVENOUS | Status: AC
  Filled 2018-04-04: qty 50

## 2018-04-04 MED ORDER — METOCLOPRAMIDE HCL 5 MG/ML IJ SOLN: 5.0000 mg | Freq: Three times a day (TID) | INTRAMUSCULAR | Status: DC | PRN

## 2018-04-04 MED ORDER — GABAPENTIN 300 MG PO CAPS
300.0000 mg | ORAL_CAPSULE | Freq: Once | ORAL | Status: AC
  Administered 2018-04-04: 300 mg via ORAL

## 2018-04-04 MED ORDER — TRAMADOL HCL 50 MG PO TABS
50.0000 mg | ORAL_TABLET | ORAL | Status: DC | PRN
  Administered 2018-04-06: 100 mg via ORAL
  Filled 2018-04-04: qty 2

## 2018-04-04 MED ORDER — ONDANSETRON HCL 4 MG/2ML IJ SOLN: 4.0000 mg | Freq: Four times a day (QID) | INTRAMUSCULAR | Status: DC | PRN

## 2018-04-04 MED ORDER — GLIPIZIDE ER 10 MG PO TB24
10.0000 mg | ORAL_TABLET | Freq: Every day | ORAL | Status: DC
  Administered 2018-04-05 – 2018-04-06 (×2): 10 mg via ORAL
  Filled 2018-04-04 (×2): qty 1

## 2018-04-04 MED ORDER — DIPHENHYDRAMINE HCL 12.5 MG/5ML PO ELIX: 12.5000 mg | ORAL_SOLUTION | ORAL | Status: DC | PRN

## 2018-04-04 MED ORDER — OXYCODONE HCL 5 MG PO TABS
10.0000 mg | ORAL_TABLET | ORAL | Status: DC | PRN
  Administered 2018-04-04 – 2018-04-06 (×5): 10 mg via ORAL
  Filled 2018-04-04 (×5): qty 2

## 2018-04-04 MED ORDER — LINAGLIPTIN 5 MG PO TABS
5.0000 mg | ORAL_TABLET | Freq: Every day | ORAL | Status: DC
  Administered 2018-04-04 – 2018-04-06 (×3): 5 mg via ORAL
  Filled 2018-04-04 (×3): qty 1

## 2018-04-04 MED ORDER — MIDAZOLAM HCL 2 MG/2ML IJ SOLN
INTRAMUSCULAR | Status: AC
  Filled 2018-04-04: qty 2

## 2018-04-04 MED ORDER — SENNOSIDES-DOCUSATE SODIUM 8.6-50 MG PO TABS
1.0000 | ORAL_TABLET | Freq: Two times a day (BID) | ORAL | Status: DC
  Administered 2018-04-04 – 2018-04-06 (×4): 1 via ORAL
  Filled 2018-04-04 (×4): qty 1

## 2018-04-04 MED ORDER — FENTANYL CITRATE (PF) 100 MCG/2ML IJ SOLN
25.0000 ug | INTRAMUSCULAR | Status: DC | PRN
  Administered 2018-04-04 (×4): 25 ug via INTRAVENOUS

## 2018-04-04 MED ORDER — ONDANSETRON HCL 4 MG/2ML IJ SOLN
4.0000 mg | Freq: Once | INTRAMUSCULAR | Status: AC | PRN
  Administered 2018-04-04: 4 mg via INTRAVENOUS

## 2018-04-04 MED ORDER — FAMOTIDINE 20 MG PO TABS
ORAL_TABLET | ORAL | Status: AC
  Administered 2018-04-04: 20 mg via ORAL
  Filled 2018-04-04: qty 1

## 2018-04-04 MED ORDER — MAGNESIUM HYDROXIDE 400 MG/5ML PO SUSP
30.0000 mL | Freq: Every day | ORAL | Status: DC | PRN
  Administered 2018-04-05 – 2018-04-06 (×2): 30 mL via ORAL
  Filled 2018-04-04 (×2): qty 30

## 2018-04-04 MED ORDER — ACETAMINOPHEN 10 MG/ML IV SOLN
INTRAVENOUS | Status: DC | PRN
  Administered 2018-04-04: 1000 mg via INTRAVENOUS

## 2018-04-04 MED ORDER — CLINDAMYCIN PHOSPHATE 600 MG/50ML IV SOLN
600.0000 mg | Freq: Four times a day (QID) | INTRAVENOUS | Status: AC
  Administered 2018-04-04 – 2018-04-05 (×4): 600 mg via INTRAVENOUS
  Filled 2018-04-04 (×4): qty 50

## 2018-04-04 MED ORDER — SODIUM CHLORIDE 0.9 % IV SOLN
INTRAVENOUS | Status: DC
  Administered 2018-04-04 – 2018-04-05 (×2): via INTRAVENOUS

## 2018-04-04 MED ORDER — NEOMYCIN-POLYMYXIN B GU 40-200000 IR SOLN
Status: AC
  Filled 2018-04-04: qty 20

## 2018-04-04 MED ORDER — METOCLOPRAMIDE HCL 10 MG PO TABS
10.0000 mg | ORAL_TABLET | Freq: Three times a day (TID) | ORAL | Status: DC
Start: 2018-04-04 — End: 2018-04-06
  Administered 2018-04-04 – 2018-04-06 (×7): 10 mg via ORAL
  Filled 2018-04-04 (×8): qty 1

## 2018-04-04 MED ORDER — PROPOFOL 10 MG/ML IV BOLUS
INTRAVENOUS | Status: AC
  Filled 2018-04-04: qty 20

## 2018-04-04 MED ORDER — BISACODYL 10 MG RE SUPP
10.0000 mg | Freq: Every day | RECTAL | Status: DC | PRN
  Filled 2018-04-04: qty 1

## 2018-04-04 MED ORDER — GABAPENTIN 300 MG PO CAPS
ORAL_CAPSULE | ORAL | Status: AC
  Administered 2018-04-04: 300 mg via ORAL
  Filled 2018-04-04: qty 1

## 2018-04-04 MED ORDER — FAMOTIDINE 20 MG PO TABS
20.0000 mg | ORAL_TABLET | Freq: Once | ORAL | Status: AC
  Administered 2018-04-04: 20 mg via ORAL

## 2018-04-04 MED ORDER — PHENYLEPHRINE HCL 10 MG/ML IJ SOLN
INTRAMUSCULAR | Status: DC | PRN
  Administered 2018-04-04: 100 ug via INTRAVENOUS

## 2018-04-04 MED ORDER — HYDROCHLOROTHIAZIDE 25 MG PO TABS
25.0000 mg | ORAL_TABLET | Freq: Every day | ORAL | Status: DC
  Administered 2018-04-04 – 2018-04-06 (×3): 25 mg via ORAL
  Filled 2018-04-04 (×3): qty 1

## 2018-04-04 MED ORDER — MENTHOL 3 MG MT LOZG
1.0000 | LOZENGE | OROMUCOSAL | Status: DC | PRN
  Filled 2018-04-04: qty 9

## 2018-04-04 MED ORDER — GABAPENTIN 300 MG PO CAPS
300.0000 mg | ORAL_CAPSULE | Freq: Every day | ORAL | Status: DC
Start: 2018-04-04 — End: 2018-04-06
  Administered 2018-04-04 – 2018-04-05 (×2): 300 mg via ORAL
  Filled 2018-04-04 (×2): qty 1

## 2018-04-04 MED ORDER — FENTANYL CITRATE (PF) 100 MCG/2ML IJ SOLN
INTRAMUSCULAR | Status: AC
  Filled 2018-04-04: qty 2

## 2018-04-04 MED ORDER — ACETAMINOPHEN 10 MG/ML IV SOLN
INTRAVENOUS | Status: AC
  Filled 2018-04-04: qty 100

## 2018-04-04 SURGICAL SUPPLY — 56 items
BLADE DRUM FLTD (BLADE) ×2 IMPLANT
BLADE SAW 1 (BLADE) ×2 IMPLANT
CANISTER SUCT 1200ML W/VALVE (MISCELLANEOUS) ×2 IMPLANT
CANISTER SUCT 3000ML PPV (MISCELLANEOUS) ×4 IMPLANT
CAPT HIP TOTAL 2 ×1 IMPLANT
CARTRIDGE OIL MAESTRO DRILL (MISCELLANEOUS) ×1 IMPLANT
DIFFUSER DRILL AIR PNEUMATIC (MISCELLANEOUS) ×2 IMPLANT
DRAPE INCISE IOBAN 66X60 STRL (DRAPES) ×2 IMPLANT
DRAPE SHEET LG 3/4 BI-LAMINATE (DRAPES) ×2 IMPLANT
DRSG DERMACEA 8X12 NADH (GAUZE/BANDAGES/DRESSINGS) ×2 IMPLANT
DRSG OPSITE POSTOP 4X12 (GAUZE/BANDAGES/DRESSINGS) ×1 IMPLANT
DRSG OPSITE POSTOP 4X14 (GAUZE/BANDAGES/DRESSINGS) ×1 IMPLANT
DRSG TEGADERM 4X4.75 (GAUZE/BANDAGES/DRESSINGS) ×2 IMPLANT
DURAPREP 26ML APPLICATOR (WOUND CARE) ×2 IMPLANT
ELECT BLADE 6.5 EXT (BLADE) ×2 IMPLANT
ELECT CAUTERY BLADE 6.4 (BLADE) ×3 IMPLANT
GLOVE BIOGEL M STRL SZ7.5 (GLOVE) ×4 IMPLANT
GLOVE BIOGEL PI IND STRL 7.0 (GLOVE) IMPLANT
GLOVE BIOGEL PI IND STRL 9 (GLOVE) ×1 IMPLANT
GLOVE BIOGEL PI INDICATOR 7.0 (GLOVE) ×5
GLOVE BIOGEL PI INDICATOR 9 (GLOVE) ×1
GLOVE INDICATOR 8.0 STRL GRN (GLOVE) ×2 IMPLANT
GLOVE SURG SYN 9.0  PF PI (GLOVE) ×2
GLOVE SURG SYN 9.0 PF PI (GLOVE) ×1 IMPLANT
GOWN STRL REUS W/ TWL LRG LVL3 (GOWN DISPOSABLE) ×2 IMPLANT
GOWN STRL REUS W/TWL 2XL LVL3 (GOWN DISPOSABLE) ×2 IMPLANT
GOWN STRL REUS W/TWL LRG LVL3 (GOWN DISPOSABLE) ×4
HEMOVAC 400CC 10FR (MISCELLANEOUS) ×2 IMPLANT
HOLDER FOLEY CATH W/STRAP (MISCELLANEOUS) ×2 IMPLANT
HOOD PEEL AWAY FLYTE STAYCOOL (MISCELLANEOUS) ×4 IMPLANT
KIT TURNOVER KIT A (KITS) ×2 IMPLANT
NDL SAFETY ECLIPSE 18X1.5 (NEEDLE) ×1 IMPLANT
NEEDLE HYPO 18GX1.5 SHARP (NEEDLE) ×2
NS IRRIG 500ML POUR BTL (IV SOLUTION) ×2 IMPLANT
OIL CARTRIDGE MAESTRO DRILL (MISCELLANEOUS) ×2
PACK HIP PROSTHESIS (MISCELLANEOUS) ×2 IMPLANT
PIN STEIN THRED 5/32 (Pin) ×2 IMPLANT
PULSAVAC PLUS IRRIG FAN TIP (DISPOSABLE) ×2
SOL .9 NS 3000ML IRR  AL (IV SOLUTION) ×1
SOL .9 NS 3000ML IRR AL (IV SOLUTION) ×1
SOL .9 NS 3000ML IRR UROMATIC (IV SOLUTION) ×1 IMPLANT
SOL PREP PVP 2OZ (MISCELLANEOUS) ×2
SOLUTION PREP PVP 2OZ (MISCELLANEOUS) ×1 IMPLANT
SPONGE DRAIN TRACH 4X4 STRL 2S (GAUZE/BANDAGES/DRESSINGS) ×2 IMPLANT
STAPLER SKIN PROX 35W (STAPLE) ×2 IMPLANT
SUT ETHIBOND #5 BRAIDED 30INL (SUTURE) ×3 IMPLANT
SUT VIC AB 0 CT1 36 (SUTURE) ×2 IMPLANT
SUT VIC AB 1 CT1 36 (SUTURE) ×4 IMPLANT
SUT VIC AB 2-0 CT1 27 (SUTURE) ×2
SUT VIC AB 2-0 CT1 TAPERPNT 27 (SUTURE) ×1 IMPLANT
SYR 20CC LL (SYRINGE) ×2 IMPLANT
TAPE ADH 3 LX (MISCELLANEOUS) ×2 IMPLANT
TAPE TRANSPORE STRL 2 31045 (GAUZE/BANDAGES/DRESSINGS) ×2 IMPLANT
TIP FAN IRRIG PULSAVAC PLUS (DISPOSABLE) ×1 IMPLANT
TOWEL OR 17X26 4PK STRL BLUE (TOWEL DISPOSABLE) ×2 IMPLANT
TRAY FOLEY W/METER SILVER 16FR (SET/KITS/TRAYS/PACK) ×2 IMPLANT

## 2018-04-04 NOTE — H&P (Signed)
The patient has been re-examined, and the chart reviewed, and there have been no interval changes to the documented history and physical.    The risks, benefits, and alternatives have been discussed at length. The patient expressed understanding of the risks benefits and agreed with plans for surgical intervention.  Hitesh Fouche P. Georgene Kopper, Jr. M.D.    

## 2018-04-04 NOTE — Discharge Instructions (Signed)
Instructions after Total Hip Replacement ° ° °  Sama Arauz P. Zamantha Strebel, Jr., M.D.    ° Dept. of Orthopaedics & Sports Medicine ° [facility] ° [address] ° [place], [place]  [address] ° Phone: [phone]   Fax: [phone] ° °  °DIET: °• Drink plenty of non-alcoholic fluids. °• Resume your normal diet. Include foods high in fiber. ° °ACTIVITY:  °• You may use crutches or a walker with weight-bearing as tolerated, unless instructed otherwise. °• You may be weaned off of the walker or crutches by your Physical Therapist.  °• Do NOT reach below the level of your knees or cross your legs until allowed.    °• Continue doing gentle exercises. Exercising will reduce the pain and swelling, increase motion, and prevent muscle weakness.   °• Please continue to use the TED compression stockings for 6 weeks. You may remove the stockings at night, but should reapply them in the morning. °• Do not drive or operate any equipment until instructed. ° °WOUND CARE:  °• Continue to use ice packs periodically to reduce pain and swelling. °• Keep the incision clean and dry. °• You may bathe or shower after the staples are removed at the first office visit following surgery. ° °MEDICATIONS: °• You may resume your regular medications. °• Please take the pain medication as prescribed on the medication. °• Do not take pain medication on an empty stomach. °• You have been given a prescription for a blood thinner to prevent blood clots. Please take the medication as instructed. (NOTE: After completing a 2 week course of Lovenox, take one Enteric-coated aspirin once a day.) °• Pain medications and iron supplements can cause constipation. Use a stool softener (Senokot or Colace) on a daily basis and a laxative (dulcolax or miralax) as needed. °• Do not drive or drink alcoholic beverages when taking pain medications. ° °CALL THE OFFICE FOR: °• Temperature above 101 degrees °• Excessive bleeding or drainage on the dressing. °• Excessive  swelling, coldness, or paleness of the toes. °• Persistent nausea and vomiting. ° °FOLLOW-UP:  °• You should have an appointment to return to the office in 6 weeks after surgery. °• Arrangements have been made for continuation of Physical Therapy (either home therapy or outpatient therapy). °  °

## 2018-04-04 NOTE — Anesthesia Post-op Follow-up Note (Signed)
Anesthesia QCDR form completed.        

## 2018-04-04 NOTE — Op Note (Signed)
OPERATIVE NOTE  DATE OF SURGERY:  04/04/2018  PATIENT NAME:  [name]   DOB: 21-Jul-1956  MRN: [id number]  PRE-OPERATIVE DIAGNOSIS: Degenerative arthrosis of the right hip, primary  POST-OPERATIVE DIAGNOSIS:  Same  PROCEDURE:  Right total hip arthroplasty  SURGEON:  Marciano Sequin. M.D.  ASSISTANT:  Vance Peper, PA (present and scrubbed throughout the case, critical for assistance with exposure, retraction, instrumentation, and closure)  ANESTHESIA: spinal  ESTIMATED BLOOD LOSS: 250 mL  FLUIDS REPLACED: 1500 mL of crystalloid  DRAINS: 2 medium drains to a Hemovac reservoir  IMPLANTS UTILIZED: DePuy 12 mm small stature AML femoral stem, 50 mm OD Pinnacle 100 acetabular component, +4 mm 10 degree Pinnacle Marathon polyethylene insert, and a 32 mm CoCr +9 mm hip ball  INDICATIONS FOR SURGERY: [name] is a 62 y.o. year old female with a long history of progressive hip and groin  pain. X-rays demonstrated severe degenerative changes. The patient had not seen any significant improvement despite conservative nonsurgical intervention. After discussion of the risks and benefits of surgical intervention, the patient expressed understanding of the risks benefits and agree with plans for total hip arthroplasty.   The risks, benefits, and alternatives were discussed at length including but not limited to the risks of infection, bleeding, nerve injury, stiffness, blood clots, the need for revision surgery, limb length inequality, dislocation, cardiopulmonary complications, among others, and they were willing to proceed.  PROCEDURE IN DETAIL: The patient was brought into the operating room and, after adequate spinal anesthesia was achieved, the patient was placed in a left lateral decubitus position. Axillary roll was placed and all bony prominences were well-padded. The patient's right hip was cleaned and prepped with alcohol and DuraPrep and draped in the usual sterile fashion. A  "timeout" was performed as per usual protocol. A lateral curvilinear incision was made gently curving towards the posterior superior iliac spine. The IT band was incised in line with the skin incision and the fibers of the gluteus maximus were split in line. The piriformis tendon was identified, skeletonized, and incised at its insertion to the proximal femur and reflected posteriorly. A T type posterior capsulotomy was performed. Prior to dislocation of the femoral head, a threaded Steinmann pin was inserted through a separate stab incision into the pelvis superior to the acetabulum and bent in the form of a stylus so as to assess limb length and hip offset throughout the procedure. The femoral head was then dislocated posteriorly. Inspection of the femoral head demonstrated severe degenerative changes with full-thickness loss of articular cartilage. The femoral neck cut was performed using an oscillating saw. The anterior capsule was elevated off of the femoral neck using a periosteal elevator. Attention was then directed to the acetabulum. The remnant of the labrum was excised using electrocautery. Inspection of the acetabulum also demonstrated significant degenerative changes. The acetabulum was reamed in sequential fashion up to a 49 mm diameter. Good punctate bleeding bone was encountered. A 50 mm Pinnacle 100 acetabular component was positioned and impacted into place. Good scratch fit was appreciated. A neutral polyethylene trial was inserted.  Attention was then directed to the proximal femur. A hole for reaming of the proximal femoral canal was created using a high-speed burr. The femoral canal was reamed in sequential fashion up to a 11.5 mm diameter. This allowed for approximately 7 cm of scratch fit.  It was thus elected to ream up to a 12 mm diameter to allow for a line to line  fit.  Serial broaches were inserted up to a 12 mm small stature femoral broach. Calcar region was planed and a trial  reduction was performed using a 32 mm hip ball with a +5 mm neck length.  Fair stability was noted in the limb was felt to be somewhat short.  The neutral polyethylene liner was replaced with a +4 mm 10 degree polyethylene trial with the high side at the 8 o'clock position.  The +5 mm neck segment was replaced with a +9 mm neck segment.  Good equalization of limb lengths and hip offset was appreciated and excellent stability was noted both anteriorly and posteriorly. Trial components were removed. The acetabular shell was irrigated with copious amounts of normal saline with antibiotic solution and suctioned dry. A +4 mm 10 degree Pinnacle Marathon polyethylene insert was positioned and impacted into place with the high side at the 8 o'clock position. Next, a 12 mm small stature AML femoral stem was positioned and impacted into place. Excellent scratch fit was appreciated. A trial reduction was again performed with a 32 mm hip ball with a +9 mm neck length. Again, good equalization of limb lengths was appreciated and excellent stability appreciated both anteriorly and posteriorly. The hip was then dislocated and the trial hip ball was removed. The Morse taper was cleaned and dried. A 32 mm cobalt chrome hip ball with a +9 mm neck length was placed on the trunnion and impacted into place. The hip was then reduced and placed through range of motion. Excellent stability was appreciated both anteriorly and posteriorly.  The wound was irrigated with copious amounts of normal saline with antibiotic solution and suctioned dry. Good hemostasis was appreciated. The posterior capsulotomy was repaired using #5 Ethibond. Piriformis tendon was reapproximated to the undersurface of the gluteus medius tendon using #5 Ethibond. Two medium drains were placed in the wound bed and brought out through separate stab incisions to be attached to a Hemovac reservoir. The IT band was reapproximated using interrupted sutures of #1 Vicryl.  Subcutaneous tissue was approximated using first #0 Vicryl followed by #2-0 Vicryl. The skin was closed with skin staples.  The patient tolerated the procedure well and was transported to the recovery room in stable condition.   Marciano Sequin., M.D.

## 2018-04-04 NOTE — Anesthesia Preprocedure Evaluation (Signed)
Anesthesia Evaluation  Patient identified by MRN, date of birth, ID band Patient awake    Reviewed: Allergy & Precautions, NPO status , Patient's Chart, lab work & pertinent test results  History of Anesthesia Complications Negative for: history of anesthetic complications  Airway Mallampati: III       Dental  (+) Partial Lower   Pulmonary neg sleep apnea, neg COPD, former smoker,           Cardiovascular hypertension, Pt. on medications (-) Past MI and (-) CHF (-) dysrhythmias (-) Valvular Problems/Murmurs     Neuro/Psych neg Seizures Anxiety Depression    GI/Hepatic Neg liver ROS, GERD  ,  Endo/Other  diabetes, Type 2, Oral Hypoglycemic Agents  Renal/GU negative Renal ROS     Musculoskeletal   Abdominal   Peds  Hematology   Anesthesia Other Findings   Reproductive/Obstetrics                             Anesthesia Physical Anesthesia Plan  ASA: III  Anesthesia Plan: Spinal   Post-op Pain Management:    Induction:   PONV Risk Score and Plan:   Airway Management Planned: Nasal Cannula  Additional Equipment:   Intra-op Plan:   Post-operative Plan:   Informed Consent: I have reviewed the patients History and Physical, chart, labs and discussed the procedure including the risks, benefits and alternatives for the proposed anesthesia with the patient or authorized representative who has indicated his/her understanding and acceptance.     Plan Discussed with:   Anesthesia Plan Comments:         Anesthesia Quick Evaluation

## 2018-04-04 NOTE — Transfer of Care (Signed)
Immediate Anesthesia Transfer of Care Note  Patient: [name]  Procedure(s) Performed: TOTAL HIP ARTHROPLASTY (Right Hip)  Patient Location: PACU  Anesthesia Type:Spinal  Level of Consciousness: awake  Airway & Oxygen Therapy: Patient Spontanous Breathing and Patient connected to nasal cannula oxygen  Post-op Assessment: Report given to RN and Post -op Vital signs reviewed and stable  Post vital signs: Reviewed and stable  Last Vitals:  Vitals Value Taken Time  BP 149/90 04/04/2018  6:18 PM  Temp 36.3 C 04/04/2018  6:18 PM  Pulse 73 04/04/2018  6:21 PM  Resp 17 04/04/2018  6:21 PM  SpO2 98 % 04/04/2018  6:21 PM  Vitals shown include unvalidated device data.  Last Pain:  Vitals:   04/04/18 1818  TempSrc:   PainSc: (P) Asleep         Complications: No apparent anesthesia complications

## 2018-04-04 NOTE — Anesthesia Procedure Notes (Signed)
Spinal  Start time: 04/04/2018 2:51 PM End time: 04/04/2018 2:59 PM Staffing Anesthesiologist: Gunnar Fusi, MD Resident/CRNA: Allean Found, CRNA Performed: resident/CRNA  Preanesthetic Checklist Completed: patient identified, site marked, surgical consent, pre-op evaluation, timeout performed, IV checked, risks and benefits discussed and monitors and equipment checked Spinal Block Patient position: sitting Prep: ChloraPrep Patient monitoring: heart rate, cardiac monitor, continuous pulse ox and blood pressure Approach: midline Location: L4-5 Injection technique: single-shot Needle Needle type: Pencan and Introducer  Needle gauge: 24 G Needle length: 10 cm Needle insertion depth: 9 cm Catheter type: closed end flexible Assessment Sensory level: T4 Additional Notes 2-3 attempts after back prepped with chloraprep,  All level L4-5.  No parathesias.  Patient tolerated well.

## 2018-04-04 NOTE — Anesthesia Procedure Notes (Signed)
Date/Time: 04/04/2018 2:45 PM Performed by: Allean Found, CRNA Pre-anesthesia Checklist: Patient identified, Emergency Drugs available, Suction available, Patient being monitored and Timeout performed Oxygen Delivery Method: Nasal cannula Placement Confirmation: positive ETCO2

## 2018-04-05 ENCOUNTER — Encounter: Payer: Self-pay | Admitting: Orthopedic Surgery

## 2018-04-05 LAB — GLUCOSE, CAPILLARY
Glucose-Capillary: 263 mg/dL — ABNORMAL HIGH (ref 65–99)
Glucose-Capillary: 281 mg/dL — ABNORMAL HIGH (ref 65–99)
Glucose-Capillary: 166 mg/dL — ABNORMAL HIGH (ref 65–99)
Glucose-Capillary: 100 mg/dL — ABNORMAL HIGH (ref 65–99)

## 2018-04-05 MED ORDER — ENOXAPARIN SODIUM 40 MG/0.4ML ~~LOC~~ SOLN: 40.0000 mg | SUBCUTANEOUS | 0 refills | Status: DC

## 2018-04-05 MED ORDER — TRAMADOL HCL 50 MG PO TABS: 50.0000 mg | ORAL_TABLET | ORAL | 0 refills | Status: DC | PRN

## 2018-04-05 MED ORDER — OXYCODONE HCL 5 MG PO TABS: 5.0000 mg | ORAL_TABLET | ORAL | 0 refills | Status: DC | PRN

## 2018-04-05 NOTE — Anesthesia Postprocedure Evaluation (Signed)
Anesthesia Post Note  Patient: [name]  Procedure(s) Performed: TOTAL HIP ARTHROPLASTY (Right Hip)  Patient location during evaluation: Nursing Unit Anesthesia Type: Spinal Level of consciousness: awake Pain management: pain level controlled Respiratory status: spontaneous breathing Cardiovascular status: blood pressure returned to baseline and stable Postop Assessment: no headache Anesthetic complications: no     Last Vitals:  Vitals:   04/05/18 0020 04/05/18 0355  BP: 124/74 120/80  Pulse: 70 75  Resp: 18 18  Temp: 36.7 C 37 C  SpO2: 95% 100%    Last Pain:  Vitals:   04/05/18 0355  TempSrc: Oral  PainSc:                  [name]

## 2018-04-05 NOTE — Discharge Summary (Signed)
Physician Discharge Summary  Patient ID: [name] MRN: [id number] DOB/AGE: September 27, 1956 62 y.o.  Admit date: 04/04/2018 Discharge date: 04/06/2018  Admission Diagnoses:  primary osteoarthritis of right hip   Discharge Diagnoses: Patient Active Problem List   Diagnosis Date Noted  . Status post total replacement of hip 04/04/2018  . Primary osteoarthritis of right hip 02/20/2018  . Allergic rhinitis 10/28/2015  . Acute stress disorder 08/01/2015  . Anxiety 08/01/2015  . Clinical depression 08/01/2015  . Diabetes ([place]) 08/01/2015  . Diverticulosis of colon 08/01/2015  . Generalized pruritus 08/01/2015  . BP (high blood pressure) 08/01/2015  . Cannot sleep 08/01/2015  . Adiposity 08/01/2015  . Detrusor muscle hypertonia 08/01/2015  . Hypercholesterolemia without hypertriglyceridemia 08/01/2015  . Female stress incontinence 08/01/2015  . Avitaminosis D 08/01/2015    Past Medical History:  Diagnosis Date  . Anxiety   . Arthritis   . Cataract    left  . Depression   . Diabetes mellitus without complication ([place])   . Environmental and seasonal allergies   . GERD (gastroesophageal reflux disease)   . Hyperlipidemia   . Hypertension   . Joint pain    as reported by patient  . Urinary incontinence    as stated by patient     Transfusion: No transfusions on this admission   Consultants (if any):   Discharged Condition: Improved  Hospital Course: [name] is an 62 y.o. female who was admitted 04/04/2018 with a diagnosis of degenerative arthrosis right hip and went to the operating room on 04/04/2018 and underwent the above named procedures.    Surgeries:Procedure(s): TOTAL HIP ARTHROPLASTY on 04/04/2018  PRE-OPERATIVE DIAGNOSIS: Degenerative arthrosis of the right hip, primary  POST-OPERATIVE DIAGNOSIS:  Same  PROCEDURE:  Right total hip arthroplasty  SURGEON:  Marciano Sequin. M.D.  ASSISTANT:  Vance Peper, PA (present and scrubbed throughout the  case, critical for assistance with exposure, retraction, instrumentation, and closure)  ANESTHESIA: spinal  ESTIMATED BLOOD LOSS: 250 mL  FLUIDS REPLACED: 1500 mL of crystalloid  DRAINS: 2 medium drains to a Hemovac reservoir  IMPLANTS UTILIZED: DePuy 12 mm small stature AML femoral stem, 50 mm OD Pinnacle 100 acetabular component, +4 mm 10 degree Pinnacle Marathon polyethylene insert, and a 32 mm CoCr +9 mm hip ball  INDICATIONS FOR SURGERY: [name] is a 62 y.o. year old female with a long history of progressive hip and groin  pain. X-rays demonstrated severe degenerative changes. The patient had not seen any significant improvement despite conservative nonsurgical intervention. After discussion of the risks and benefits of surgical intervention, the patient expressed understanding of the risks benefits and agree with plans for total hip arthroplasty.   The risks, benefits, and alternatives were discussed at length including but not limited to the risks of infection, bleeding, nerve injury, stiffness, blood clots, the need for revision surgery, limb length inequality, dislocation, cardiopulmonary complications, among others, and they were willing to proceed.   Patient tolerated the surgery well. No complications .Patient was taken to PACU where she was stabilized and then transferred to the orthopedic floor.  Patient started on Lovenox 30 mg q 12 hrs. Foot pumps applied bilaterally at 80 mm hgb. Heels elevated off bed with rolled towels. No evidence of DVT. Calves non tender. Negative Homan. Physical therapy started on day #1 for gait training and transfer with OT starting on  day #1 for ADL and assisted devices. Patient has done well with therapy. Ambulated greater than 200 feet  upon being discharged.  Was able to ascend and descend 4 steps safely and independently  Patient's IV And Foley were discontinued on day #1 with Hemovac being discontinued on day #2. Dressing was  changed on day 2 prior to patient being discharged   She was given perioperative antibiotics:  Anti-infectives (From admission, onward)   Start     Dose/Rate Route Frequency Ordered Stop   04/04/18 2100  clindamycin (CLEOCIN) IVPB 600 mg     600 mg 100 mL/hr over 30 Minutes Intravenous Every 6 hours 04/04/18 2004 04/05/18 2059   04/04/18 1301  vancomycin (VANCOCIN) 1-5 GM/200ML-% IVPB    Note to Pharmacy:  Myles Lipps   : cabinet override      04/04/18 1301 04/04/18 1403   04/04/18 1300  clindamycin (CLEOCIN) 900 MG/50ML IVPB    Note to Pharmacy:  Myles Lipps   : cabinet override      04/04/18 1300 04/04/18 1520   04/03/18 0511  clindamycin (CLEOCIN) IVPB 900 mg     900 mg 100 mL/hr over 30 Minutes Intravenous On call to O.R. 04/03/18 0932 04/04/18 0559    .  She was fitted with AV 1 compression foot pump devices, instructed on heel pumps, early ambulation, and fitted with TED stockings bilaterally for DVT prophylaxis.  She benefited maximally from the hospital stay and there were no complications.    Recent vital signs:  Vitals:   04/05/18 0020 04/05/18 0355  BP: 124/74 120/80  Pulse: 70 75  Resp: 18 18  Temp: 98 F (36.7 C) 98.6 F (37 C)  SpO2: 95% 100%    Recent laboratory studies:  Lab Results  Component Value Date   HGB 14.3 03/23/2018   HGB 15.7 11/13/2016   HGB 15.0 02/25/2016   Lab Results  Component Value Date   WBC 10.9 03/23/2018   PLT 277 03/23/2018   Lab Results  Component Value Date   INR 0.94 03/23/2018   Lab Results  Component Value Date   NA 135 03/23/2018   K 3.6 03/23/2018   CL 98 (L) 03/23/2018   CO2 25 03/23/2018   BUN 19 03/23/2018   CREATININE 0.73 03/23/2018   GLUCOSE 113 (H) 03/23/2018    Discharge Medications:   Allergies as of 04/05/2018      Reactions   Jardiance [empagliflozin] Itching   Recurrent mycotic infections   Penicillins Rash, Other (See Comments)   Has patient had a PCN reaction causing immediate  rash, facial/tongue/throat swelling, SOB or lightheadedness with hypotension: No Has patient had a PCN reaction causing severe rash involving mucus membranes or skin necrosis: No Has patient had a PCN reaction that required hospitalization No Has patient had a PCN reaction occurring within the last 10 years: No If all of the above answers are "NO", then may proceed with Cephalosporin use.      Medication List    STOP taking these medications   aspirin 81 MG tablet   naproxen sodium 220 MG tablet Commonly known as:  ALEVE     TAKE these medications   ALPRAZolam 0.5 MG tablet Commonly known as:  XANAX TAKE 1/2 TO 1 TABLET BY MOUTH BEFORE WORK AND TAKE 1 TABLET EVERY 8 HOURS AS NEEDED What changed:  See the new instructions.   enoxaparin 40 MG/0.4ML injection Commonly known as:  LOVENOX Inject 0.4 mLs (40 mg total) into the skin daily for 14 days. Start taking on:  04/07/2018   glipiZIDE 10 MG 24 hr tablet Commonly  known as:  GLUCOTROL XL TAKE 1 TABLET BY MOUTH DAILY WITH BREAKFAST. What changed:    how much to take  how to take this  when to take this   hydrochlorothiazide 25 MG tablet Commonly known as:  HYDRODIURIL TAKE 1 TABLET (25 MG TOTAL) BY MOUTH DAILY.   losartan 25 MG tablet Commonly known as:  COZAAR TAKE 1 TABLET BY MOUTH TWICE DAILY What changed:    how much to take  how to take this  when to take this   metFORMIN 1000 MG tablet Commonly known as:  GLUCOPHAGE TAKE 1 TABLET BY MOUTH TWICE A DAY WITH A MEAL. What changed:  See the new instructions.   multivitamin tablet Take 1 tablet by mouth daily.   oxyCODONE 5 MG immediate release tablet Commonly known as:  Oxy IR/ROXICODONE Take 1 tablet (5 mg total) by mouth every 4 (four) hours as needed for moderate pain (pain score 4-6).   pravastatin 40 MG tablet Commonly known as:  PRAVACHOL Take 1 tablet (40 mg total) by mouth daily.   sitaGLIPtin 100 MG tablet Commonly known as:  JANUVIA Take 1  tablet (100 mg total) by mouth daily.   traMADol 50 MG tablet Commonly known as:  ULTRAM Take 1-2 tablets (50-100 mg total) by mouth every 4 (four) hours as needed for moderate pain.            Durable Medical Equipment  (From admission, onward)        Start     Ordered   04/04/18 2004  DME Walker rolling  Once    Question:  Patient needs a walker to treat with the following condition  Answer:  S/P total hip arthroplasty   04/04/18 2004   04/04/18 2004  DME Bedside commode  Once    Question:  Patient needs a bedside commode to treat with the following condition  Answer:  S/P total hip arthroplasty   04/04/18 2004      Diagnostic Studies: Dg Hip Port Unilat With Pelvis 1v Right  Result Date: 04/04/2018 CLINICAL DATA:  Right hip arthroplasty EXAM: DG HIP (WITH OR WITHOUT PELVIS) 1V PORT RIGHT COMPARISON:  10/01/2017 FINDINGS: New uncemented right total press fit arthroplasty with overlying single surgical drain is identified. No immediate postoperative complications nor evidence of hardware failure. Stable degenerative joint space narrowing of the left hip. No acute fracture. Stable heterotopic ossifications adjacent to the right ischial spine. IMPRESSION: No immediate postoperative complications status post right total hip arthroplasty with uncemented femoral component. Single surgical drain is in place. Electronically Signed   By: Ashley Royalty M.D.   On: 04/04/2018 20:50    Disposition:     Follow-up Information    Hooten, Laurice Record, MD On 05/17/2018.   Specialty:  Orthopedic Surgery Why:  at 10:00am Contact information: [place] [address] [phone]            Signed: Watt Climes 04/05/2018, 7:55 AM

## 2018-04-05 NOTE — Care Management Note (Signed)
Case Management Note  Patient Details  Name: [name] MRN: [id number] Date of Birth: [birth year]  Subjective/Objective:  POD # 1 right total hip arthroplasty. Met with patient at bedside. She lives at home alone but will be going to her daughters house at discharge:  [place], [place] [id number] Offered choice of home health agencies. Referral to [facility] for HHPT.  She will need a walker and bsc. Ordered from [place] with [facility]. Pharmacy: EQFD:[phone] will check price of Lovenox prior to discharge.                  Action/Plan: [facility] for HHPT, Walker and BSC from [facility]. Check price of Lovenox prior to discharge.    Expected Discharge Date:                  Expected Discharge Plan:  [place]  In-House Referral:     Discharge planning Services  CM Consult  Post Acute Care Choice:  Durable Medical Equipment, Home Health Choice offered to:  Patient  DME Arranged:  Bedside commode, Walker rolling DME Agency:  [place]:  [place]:  [facility] ([facility])  Status of Service:  In process, will continue to follow  If discussed at Long Length of Stay Meetings, dates discussed:    Additional Comments:  Jolly Mango, RN 04/05/2018, 4:18 PM

## 2018-04-05 NOTE — Progress Notes (Addendum)
Rested quietly during this shift. Tolerated sitting on side of bed. Dsg dry and intact to right hip. Instructed to use incentive spirometer.  Posterior hip precautions followed.Vital signs stable Staff will continue to follow . Minimal pain.

## 2018-04-05 NOTE — NC FL2 (Signed)
[place] LEVEL OF CARE SCREENING TOOL     IDENTIFICATION  Patient Name: [name] Birthdate: Sep 02, 1956 Sex: female Admission Date (Current Location): 04/04/2018  [place] and [place] Number:  Engineering geologist and Address:  [facility], [address], [place], [place] [address]      Provider Number: [id number]  Attending Physician Name and Address:  Dereck Leep, MD  Relative Name and Phone Number:       Current Level of Care: Hospital Recommended Level of Care: [place] Prior Approval Number:    Date Approved/Denied:   PASRR Number: [id number]  Discharge Plan: SNF    Current Diagnoses: Patient Active Problem List   Diagnosis Date Noted  . Status post total replacement of hip 04/04/2018  . Primary osteoarthritis of right hip 02/20/2018  . Allergic rhinitis 10/28/2015  . Acute stress disorder 08/01/2015  . Anxiety 08/01/2015  . Clinical depression 08/01/2015  . Diabetes ([place]) 08/01/2015  . Diverticulosis of colon 08/01/2015  . Generalized pruritus 08/01/2015  . BP (high blood pressure) 08/01/2015  . Cannot sleep 08/01/2015  . Adiposity 08/01/2015  . Detrusor muscle hypertonia 08/01/2015  . Hypercholesterolemia without hypertriglyceridemia 08/01/2015  . Female stress incontinence 08/01/2015  . Avitaminosis D 08/01/2015    Orientation RESPIRATION BLADDER Height & Weight     Self, Time, Situation, Place  Normal Continent Weight: 206 lb (93.4 kg) Height:  5\' 6"  (167.6 cm)  BEHAVIORAL SYMPTOMS/MOOD NEUROLOGICAL BOWEL NUTRITION STATUS      Continent Diet(Carb modified)  AMBULATORY STATUS COMMUNICATION OF NEEDS Skin   Extensive Assist Verbally Normal                       Personal Care Assistance Level of Assistance  Dressing, Bathing, Feeding Bathing Assistance: Limited assistance Feeding assistance: Independent Dressing Assistance: Limited assistance     Functional Limitations  Info             SPECIAL CARE FACTORS FREQUENCY  PT (By licensed PT), OT (By licensed OT)                    Contractures Contractures Info: Not present    Additional Factors Info  Code Status, Allergies Code Status Info: (Full Code ) Allergies Info: Penicillin, Jardiance           Current Medications (04/05/2018):  This is the current hospital active medication list Current Facility-Administered Medications  Medication Dose Route Frequency Provider Last Rate Last Dose  . 0.9 %  sodium chloride infusion   Intravenous Continuous Hooten, Laurice Record, MD 100 mL/hr at 04/05/18 0717    . acetaminophen (OFIRMEV) IV 1,000 mg  1,000 mg Intravenous Q6H Hooten, Laurice Record, MD   Stopped at 04/05/18 0400  . acetaminophen (TYLENOL) tablet 325-650 mg  325-650 mg Oral Q6H PRN Hooten, Laurice Record, MD      . ALPRAZolam Duanne Moron) tablet 0.5 mg  0.5 mg Oral Q8H PRN Dereck Leep, MD   0.5 mg at 04/04/18 2142  . alum & mag hydroxide-simeth (MAALOX/MYLANTA) 200-200-20 MG/5ML suspension 30 mL  30 mL Oral Q4H PRN Hooten, Laurice Record, MD      . bisacodyl (DULCOLAX) suppository 10 mg  10 mg Rectal Daily PRN Hooten, Laurice Record, MD      . celecoxib (CELEBREX) capsule 200 mg  200 mg Oral BID Dereck Leep, MD   200 mg at 04/04/18 2207  . clindamycin (CLEOCIN) IVPB 600 mg  600 mg Intravenous Q6H Hooten, Laurice Record, MD   Stopped at 04/05/18 [phone]  . diphenhydrAMINE (BENADRYL) 12.5 MG/5ML elixir 12.5-25 mg  12.5-25 mg Oral Q4H PRN Hooten, Laurice Record, MD      . enoxaparin (LOVENOX) injection 30 mg  30 mg Subcutaneous Q12H Hooten, Laurice Record, MD      . ferrous sulfate tablet 325 mg  325 mg Oral BID WC Hooten, Laurice Record, MD      . gabapentin (NEURONTIN) capsule 300 mg  300 mg Oral QHS Hooten, Laurice Record, MD   300 mg at 04/04/18 2142  . glipiZIDE (GLUCOTROL XL) 24 hr tablet 10 mg  10 mg Oral Q breakfast Hooten, Laurice Record, MD      . hydrochlorothiazide (HYDRODIURIL) tablet 25 mg  25 mg Oral Daily Hooten, Laurice Record, MD   25 mg at 04/04/18 2141   . HYDROmorphone (DILAUDID) injection 0.5-1 mg  0.5-1 mg Intravenous Q4H PRN Hooten, Laurice Record, MD      . insulin aspart (novoLOG) injection 0-15 Units  0-15 Units Subcutaneous TID WC Hooten, Laurice Record, MD      . linagliptin (TRADJENTA) tablet 5 mg  5 mg Oral Daily Hooten, Laurice Record, MD   5 mg at 04/04/18 2142  . losartan (COZAAR) tablet 25 mg  25 mg Oral BID Dereck Leep, MD   25 mg at 04/04/18 2142  . magnesium hydroxide (MILK OF MAGNESIA) suspension 30 mL  30 mL Oral Daily PRN Hooten, Laurice Record, MD      . menthol-cetylpyridinium (CEPACOL) lozenge 3 mg  1 lozenge Oral PRN Hooten, Laurice Record, MD       Or  . phenol (CHLORASEPTIC) mouth spray 1 spray  1 spray Mouth/Throat PRN Hooten, Laurice Record, MD      . metFORMIN (GLUCOPHAGE) tablet 1,000 mg  1,000 mg Oral BID WC Hooten, Laurice Record, MD      . metoCLOPramide (REGLAN) tablet 5-10 mg  5-10 mg Oral Q8H PRN Hooten, Laurice Record, MD   10 mg at 04/04/18 2141   Or  . metoCLOPramide (REGLAN) injection 5-10 mg  5-10 mg Intravenous Q8H PRN Hooten, Laurice Record, MD      . metoCLOPramide (REGLAN) tablet 10 mg  10 mg Oral TID AC & HS Hooten, Laurice Record, MD   10 mg at 04/04/18 2208  . multivitamin with minerals tablet 1 tablet  1 tablet Oral Daily Hooten, Laurice Record, MD   1 tablet at 04/04/18 2142  . ondansetron (ZOFRAN) tablet 4 mg  4 mg Oral Q6H PRN Hooten, Laurice Record, MD       Or  . ondansetron (ZOFRAN) injection 4 mg  4 mg Intravenous Q6H PRN Hooten, Laurice Record, MD      . oxyCODONE (Oxy IR/ROXICODONE) immediate release tablet 10 mg  10 mg Oral Q4H PRN Dereck Leep, MD   10 mg at 04/04/18 2143  . oxyCODONE (Oxy IR/ROXICODONE) immediate release tablet 5 mg  5 mg Oral Q4H PRN Hooten, Laurice Record, MD      . pantoprazole (PROTONIX) EC tablet 40 mg  40 mg Oral BID Dereck Leep, MD   40 mg at 04/04/18 2141  . pravastatin (PRAVACHOL) tablet 40 mg  40 mg Oral Daily Hooten, Laurice Record, MD   40 mg at 04/04/18 2142  . senna-docusate (Senokot-S) tablet 1 tablet  1 tablet Oral BID Dereck Leep, MD   1  tablet at 04/04/18 2141  . sodium phosphate (FLEET) 7-19 GM/118ML enema  1 enema  1 enema Rectal Once PRN Hooten, Laurice Record, MD      . traMADol Veatrice Bourbon) tablet 50-100 mg  50-100 mg Oral Q4H PRN Hooten, Laurice Record, MD         Discharge Medications: Please see discharge summary for a list of discharge medications.  Relevant Imaging Results:  Relevant Lab Results:   Additional Information SSN [id number]  Annamaria Boots, [place]

## 2018-04-05 NOTE — Progress Notes (Signed)
Clinical Social Worker (CSW) received SNF consult. PT is recommending home health. RN case manager aware of above. Please reconsult if future social work needs arise. CSW signing off.   Hildy Nicholl, LCSW [phone] 

## 2018-04-05 NOTE — Progress Notes (Signed)
Physical Therapy Treatment Patient Details Name: [name] MRN: [id number] DOB: May 07, 1956 Today's Date: 04/05/2018    History of Present Illness 62 y/o female s/p R THR (posterior approach) 04/04/18.    PT Comments    Pt did well with PT session and generally showed good strength, mobility and ambulation this afternoon.  She reported 0/10 pain at test with max pain of ~5/10 during activity. She showed great effort t/o the session and has surpassed typical POD1 goals.  Plan to go home on d/c is still very appropriate.    Follow Up Recommendations  Home health PT     Equipment Recommendations  Rolling walker with 5" wheels;3in1 (PT)    Recommendations for Other Services       Precautions / Restrictions Precautions Precautions: Posterior Hip;Fall Precaution Booklet Issued: Yes (comment) Restrictions Weight Bearing Restrictions: Yes RLE Weight Bearing: Weight bearing as tolerated    Mobility  Bed Mobility Overal bed mobility: Modified Independent Bed Mobility: Sit to Supine       Sit to supine: Min guard   General bed mobility comments: Pt needed to use UEs to get R LE back into bed, but ultimately did so w/o assist  Transfers Overall transfer level: Modified independent Equipment used: Rolling walker (2 wheeled)             General transfer comment: Pt able to rise with relative ease, did need cuing to use UEs appropriately  Ambulation/Gait Ambulation/Gait assistance: Min guard Ambulation Distance (Feet): 125 Feet Assistive device: Rolling walker (2 wheeled)       General Gait Details: Pt was able to maintain consistent cadence t/o much of the ambulation effort and generally showed very effort with minimal c/o fatigue/pain/etc   Stairs             Wheelchair Mobility    Modified Rankin (Stroke Patients Only)       Balance Overall balance assessment: Modified Independent                                           Cognition Arousal/Alertness: Awake/alert Behavior During Therapy: WFL for tasks assessed/performed Overall Cognitive Status: Within Functional Limits for tasks assessed                                        Exercises Total Joint Exercises Ankle Circles/Pumps: AROM;10 reps Quad Sets: Strengthening;10 reps Gluteal Sets: Strengthening;10 reps Short Arc Quad: Strengthening;15 reps Heel Slides: Strengthening;10 reps Hip ABduction/ADduction: Strengthening;10 reps Other Exercises Other Exercises: pt/family educated in compression stocking mgt, posterior THPs and how to implement during ADL, bed mobility and set up, AE/DME for ADL, and falls prevention strategies.     General Comments        Pertinent Vitals/Pain Pain Assessment: 0-10(no pain at rest) Pain Score: 4  Pain Intervention(s): Limited activity within patient's tolerance;Monitored during session    [place] expects to be discharged to:: Private residence Living Arrangements: Alone Available Help at Discharge: Family(pt will be staying with dtr at dtr's home) Type of Home: House Home Access: Stairs to enter Entrance Stairs-Rails: (step, landing, step) Home Layout: One level Home Equipment: None      Prior Function Level of Independence: Independent      Comments: Pt works in Programme researcher, broadcasting/film/video at [employer], on  feet all day; independent with mobility and ADL tasks, driving. No falls in past 12 months.   PT Goals (current goals can now be found in the care plan section) Acute Rehab PT Goals Patient Stated Goal: Go home and then return to work Progress towards PT goals: Progressing toward goals    Frequency    BID      PT Plan Current plan remains appropriate    Co-evaluation              AM-PAC PT "6 Clicks" Daily Activity  Outcome Measure  Difficulty turning over in bed (including adjusting bedclothes, sheets and blankets)?: None Difficulty moving from lying on back to  sitting on the side of the bed? : A Little Difficulty sitting down on and standing up from a chair with arms (e.g., wheelchair, bedside commode, etc,.)?: A Little Help needed moving to and from a bed to chair (including a wheelchair)?: None Help needed walking in hospital room?: None Help needed climbing 3-5 steps with a railing? : A Little 6 Click Score: 21    End of Session Equipment Utilized During Treatment: Gait belt Activity Tolerance: Patient tolerated treatment well Patient left: with bed alarm set;with call bell/phone within reach Nurse Communication: Mobility status PT Visit Diagnosis: Muscle weakness (generalized) (M62.81);Difficulty in walking, not elsewhere classified (R26.2)     Time: 7902-4097 PT Time Calculation (min) (ACUTE ONLY): 26 min  Charges:  $Gait Training: 8-22 mins $Therapeutic Exercise: 8-22 mins                    G Codes:       Kreg Shropshire, DPT 04/05/2018, 3:44 PM

## 2018-04-05 NOTE — Progress Notes (Addendum)
Inpatient Diabetes Program Recommendations  AACE/ADA: New Consensus Statement on Inpatient Glycemic Control (2015)  Target Ranges:  Prepandial:   less than 140 mg/dL      Peak postprandial:   less than 180 mg/dL (1-2 hours)      Critically ill patients:  140 - 180 mg/dL  Results for SIMRIT, GOHLKE (MRN [id number]) as of 04/05/2018 08:32  Ref. Range 04/04/2018 13:31 04/04/2018 18:26 04/04/2018 19:33 04/04/2018 21:05 04/05/2018 08:25  Glucose-Capillary Latest Ref Range: 65 - 99 mg/dL 146 (H) 217 (H) 285 (H) 268 (H) 263 (H)  Results for EMIL, KLASSEN (MRN [id number]) as of 04/05/2018 08:32  Ref. Range 03/24/2018 16:05  Hemoglobin A1C Latest Ref Range: 4.8 - 5.6 % 7.9 (H)   Review of Glycemic Control  Diabetes history: DM2 Outpatient Diabetes medications: Glipizide XL 10 mg QAM, Metformin 1000 mg BID, Januvia 100 mg daily Current orders for Inpatient glycemic control: Glipizide XL 10 mg QAM, Metformin 1000 mg BID, Tradjenta 5 mg daily, Novolog 0-15 units TID with meals  Inpatient Diabetes Program Recommendations: Hyperglycemia: Patient received Decadron 8mg  at 13:37 on 04/04/18 which is contributing to hyperglycemia. No there steroids ordered or given since then.  Insulin-Correction: Please consider ordering Novolog 0-5 units QHS for bedtime correction scale. HgbA1C: A1C 7.9% on 03/24/18 indicating an average glucose of 180 mg/dl over the past 2-3 months.   Thanks, Barnie Alderman, RN, MSN, CDE Diabetes Coordinator Inpatient Diabetes Program [phone] (Team Pager from 8am to 5pm)

## 2018-04-05 NOTE — Evaluation (Signed)
Occupational Therapy Evaluation Patient Details Name: [name] MRN: [id number] DOB: 01/31/1956 Today's Date: 04/05/2018    History of Present Illness 62 y/o female s/p R THR (posterior approach) 04/04/18.   Clinical Impression   Pt was seen today for an OT evaluation on POD#1 s/p R THR with posterior THPs. Pt lives at home but plans to stay with daughter initially after return home. Pt works in [employer] at [employer] and was independent in all ADL and mobility prior to surgery and is eager to return to PLOF.  Pt is currently limited in functional ADLs due to pain, decreased ROM, and decreased knowledge of posterior THPs. Pt/family educated in compression stocking mgt, posterior THPs and how to implement during ADL, bed mobility and set up, AE/DME for ADL, and falls prevention strategies. Pt requires moderate assist for LB dressing and bathing skills due to pain, decreased AROM of R LE, and hip precautions. Pt would benefit from continued skilled OT services for education in assistive devices, functional mobility, and education in recommendations for home modifications to increase safety and prevent falls. Recommend HHOT services to continue rehabilitation.       Follow Up Recommendations  Home health OT    Equipment Recommendations  3 in 1 bedside commode;Other (comment)(reacher)    Recommendations for Other Services       Precautions / Restrictions Precautions Precautions: Posterior Hip;Fall Restrictions Weight Bearing Restrictions: Yes RLE Weight Bearing: Weight bearing as tolerated      Mobility Bed Mobility              General bed mobility comments: deferred, up in recliner  Transfers Overall transfer level: Modified independent Equipment used: Rolling walker (2 wheeled)             General transfer comment: increased effort with BUE but no physical assist required    Balance Overall balance assessment: Modified Independent                                          ADL either performed or assessed with clinical judgement   ADL Overall ADL's : Needs assistance/impaired Eating/Feeding: Sitting;Independent   Grooming: Sitting;Independent   Upper Body Bathing: Sitting;Independent   Lower Body Bathing: Sit to/from stand;Moderate assistance Lower Body Bathing Details (indicate cue type and reason): pt/family educated in use of AE for LB bathing, use of BSC for seated sponge bath until able to take full shower Upper Body Dressing : Sitting;Independent   Lower Body Dressing: Sit to/from stand;Moderate assistance Lower Body Dressing Details (indicate cue type and reason): pt/family educated in use of AE for LB dressing  Toilet Transfer: RW;Ambulation;BSC;Modified Independent           Functional mobility during ADLs: Min guard;Rolling walker       Vision Patient Visual Report: No change from baseline       Perception     Praxis      Pertinent Vitals/Pain Pain Assessment: No/denies pain(no pain at rest, increases to "about a 5/10" with movement) Pain Score: 3  Pain Intervention(s): Limited activity within patient's tolerance;Monitored during session     Hand Dominance Right   Extremity/Trunk Assessment Upper Extremity Assessment Upper Extremity Assessment: Overall WFL for tasks assessed   Lower Extremity Assessment Lower Extremity Assessment: Defer to PT evaluation;Overall [facility] for tasks assessed(except expected R sided post-op weakness)   Cervical / Trunk Assessment Cervical / Trunk Assessment:  Normal   Communication Communication Communication: No difficulties   Cognition Arousal/Alertness: Awake/alert Behavior During Therapy: WFL for tasks assessed/performed Overall Cognitive Status: Within Functional Limits for tasks assessed                                     General Comments       Exercises Other Exercises Other Exercises: pt/family educated in compression stocking mgt, posterior  THPs and how to implement during ADL, bed mobility and set up, AE/DME for ADL, and falls prevention strategies.    Shoulder Instructions      Home Living Family/patient expects to be discharged to:: Private residence Living Arrangements: Alone Available Help at Discharge: Family(pt will be staying with dtr at dtr's home) Type of Home: House Home Access: Stairs to enter Entrance Stairs-Number of Steps: 2 Entrance Stairs-Rails: (step, landing, step) Home Layout: One level     Bathroom Shower/Tub: Teacher, early years/pre: Standard     Home Equipment: None          Prior Functioning/Environment Level of Independence: Independent        Comments: Pt works in Programme researcher, broadcasting/film/video at [employer], on Retail banker all day; independent with mobility and ADL tasks, driving. No falls in past 12 months.        OT Problem List: Decreased strength;Decreased range of motion;Decreased knowledge of use of DME or AE;Decreased knowledge of precautions      OT Treatment/Interventions: Self-care/ADL training;DME and/or AE instruction;Patient/family education    OT Goals(Current goals can be found in the care plan section) Acute Rehab OT Goals Patient Stated Goal: Go home and then return to work OT Goal Formulation: With patient/family Time For Goal Achievement: 04/19/18 Potential to Achieve Goals: Good  OT Frequency: Min 1X/week   Barriers to D/C:            Co-evaluation              AM-PAC PT "6 Clicks" Daily Activity     Outcome Measure Help from another person eating meals?: None Help from another person taking care of personal grooming?: None Help from another person toileting, which includes using toliet, bedpan, or urinal?: A Little Help from another person bathing (including washing, rinsing, drying)?: A Lot Help from another person to put on and taking off regular upper body clothing?: None Help from another person to put on and taking off regular lower body clothing?: A  Lot 6 Click Score: 19   End of Session    Activity Tolerance: Patient tolerated treatment well Patient left: in chair;with call bell/phone within reach;with chair alarm set  OT Visit Diagnosis: Other abnormalities of gait and mobility (R26.89)                Time: 7893-8101 OT Time Calculation (min): 25 min Charges:  OT General Charges $OT Visit: 1 Visit OT Evaluation $OT Eval Moderate Complexity: 1 Mod OT Treatments $Self Care/Home Management : 8-22 mins  Jeni Salles, MPH, MS, OTR/L ascom [phone] 04/05/18, 12:25 PM

## 2018-04-05 NOTE — Evaluation (Signed)
Physical Therapy Evaluation Patient Details Name: [name] MRN: [id number] DOB: Apr 21, 1956 Today's Date: 04/05/2018   History of Present Illness  62 y/o female s/p R THR (posterior approach) 04/04/18.  Clinical Impression  Pt did well with PT exam and though she had some pain and limitations she was able to do an additional 15 minutes of supine exercises and ~10 minutes of mobility/gait training with good results.  She struggled with recalling precautions explicitly but was able to participate well and did not have excessive pain (though it did increase with exercises and mobility).  Pt should be able to go to daughter's home with HHPT on d/c.    Follow Up Recommendations Home health PT    Equipment Recommendations  Rolling walker with 5" wheels;3in1 (PT)    Recommendations for Other Services       Precautions / Restrictions Precautions Precautions: Posterior Hip;Fall Restrictions Weight Bearing Restrictions: Yes RLE Weight Bearing: Weight bearing as tolerated      Mobility  Bed Mobility Overal bed mobility: Modified Independent             General bed mobility comments: Pt needing some extra time and cuing, but able to get to EOB w/o direct assist  Transfers Overall transfer level: Modified independent Equipment used: Rolling walker (2 wheeled)             General transfer comment: Pt needed definite use of UEs to push up and control descent, but able to do both w/o phyiscal assist.  Ambulation/Gait Ambulation/Gait assistance: Min guard Ambulation Distance (Feet): 35 Feet Assistive device: Rolling walker (2 wheeled)       General Gait Details: Pt did well and after just a few hesitant steps was able to take weight though R well enough to maintain slow but safe walker momentum.  t  Stairs            Wheelchair Mobility    Modified Rankin (Stroke Patients Only)       Balance Overall balance assessment: Modified Independent                                            Pertinent Vitals/Pain Pain Assessment: 0-10 Pain Score: 3     Home Living Family/patient expects to be discharged to:: Private residence Living Arrangements: Alone Available Help at Discharge: Family(pt will be staying at daughter's home) Type of Home: House Home Access: Stairs to enter Entrance Stairs-Rails: (step, landing, step) Entrance Stairs-Number of Steps: 2 Home Layout: One level Home Equipment: None      Prior Function Level of Independence: Independent         Comments: Pt works in Programme researcher, broadcasting/film/video at [employer], on feet all day     Hand Dominance        Extremity/Trunk Assessment   Upper Extremity Assessment Upper Extremity Assessment: Overall WFL for tasks assessed    Lower Extremity Assessment Lower Extremity Assessment: Overall WFL for tasks assessed(except expected R sided post-op weakness, no SLR)       Communication   Communication: No difficulties  Cognition Arousal/Alertness: Awake/alert Behavior During Therapy: WFL for tasks assessed/performed Overall Cognitive Status: Within Functional Limits for tasks assessed  General Comments      Exercises Total Joint Exercises Ankle Circles/Pumps: AROM;10 reps Quad Sets: Strengthening;10 reps Gluteal Sets: Strengthening;10 reps Short Arc Quad: Strengthening;10 reps Heel Slides: Strengthening;10 reps Hip ABduction/ADduction: 10 reps;AROM;Strengthening Straight Leg Raises: 5 reps;AAROM   Assessment/Plan    PT Assessment Patient needs continued PT services  PT Problem List Decreased strength;Decreased range of motion;Decreased balance;Decreased activity tolerance;Decreased mobility;Decreased coordination;Decreased knowledge of use of DME;Decreased safety awareness;Decreased knowledge of precautions       PT Treatment Interventions DME instruction;Gait training;Stair training;Functional mobility  training;Therapeutic activities;Therapeutic exercise;Balance training;Neuromuscular re-education;Patient/family education    PT Goals (Current goals can be found in the Care Plan section)  Acute Rehab PT Goals Patient Stated Goal: Go home and then return to work PT Goal Formulation: With patient Time For Goal Achievement: 04/19/18 Potential to Achieve Goals: Good    Frequency BID   Barriers to discharge        Co-evaluation               AM-PAC PT "6 Clicks" Daily Activity  Outcome Measure Difficulty turning over in bed (including adjusting bedclothes, sheets and blankets)?: None Difficulty moving from lying on back to sitting on the side of the bed? : A Little Difficulty sitting down on and standing up from a chair with arms (e.g., wheelchair, bedside commode, etc,.)?: A Little Help needed moving to and from a bed to chair (including a wheelchair)?: None Help needed walking in hospital room?: None Help needed climbing 3-5 steps with a railing? : A Little 6 Click Score: 21    End of Session Equipment Utilized During Treatment: Gait belt Activity Tolerance: Patient tolerated treatment well   Nurse Communication: Mobility status PT Visit Diagnosis: Muscle weakness (generalized) (M62.81);Difficulty in walking, not elsewhere classified (R26.2)    Time: 9169-4503 PT Time Calculation (min) (ACUTE ONLY): 39 min   Charges:   PT Evaluation $PT Eval Low Complexity: 1 Low PT Treatments $Gait Training: 8-22 mins $Therapeutic Exercise: 8-22 mins   PT G Codes:        Kreg Shropshire, DPT 04/05/2018, 12:03 PM

## 2018-04-05 NOTE — Progress Notes (Signed)
ORTHOPAEDICS PROGRESS NOTE  PATIENT NAME: [name] DOB: 02-16-1956  MRN: [id number]  POD # 1: Right total hip arthroplasty  Subjective: The patient slept well last night.  Pain is been under good control and she denies any significant pain this morning.  No nausea or vomiting.  Objective: Vital signs in last 24 hours: Temp:  [97.4 F (36.3 C)-98.7 F (37.1 C)] 98.6 F (37 C) (04/23 0355) Pulse Rate:  [54-85] 75 (04/23 0355) Resp:  [11-21] 18 (04/23 0355) BP: (120-190)/(68-101) 120/80 (04/23 0355) SpO2:  [92 %-100 %] 100 % (04/23 0355) Weight:  [93.4 kg (206 lb)] 93.4 kg (206 lb) (04/22 1319)  Intake/Output from previous day: 04/22 0701 - 04/23 0700 In: 4185 [I.V.:2825; IV Piggyback:500] Out: 1340 [Urine:1050; Drains:40; Blood:250]  No results for input(s): WBC, HGB, HCT, PLT, K, CL, CO2, BUN, CREATININE, GLUCOSE, CALCIUM, LABPT, INR in the last 72 hours.  EXAM General: Well-developed well-nourished female seen in no apparent discomfort. Lungs: clear to auscultation Cardiac: normal rate and regular rhythm Right lower extremity: Dressing is clean and dry.  No significant swelling or ecchymosis to the hip or thigh.  Homans test is negative. Neurologic: Awake, alert, and oriented.  Sensorimotor function grossly intact.  Assessment: Right total hip arthroplasty  Secondary diagnoses: Positive MRSA nasal swab Hypertension Hyperlipidemia Gastroesophageal reflux disease Diabetes Depression Anxiety  Plan: Today's goals were reviewed with the patient. Begin physical therapy and occupational therapy as per total hip arthroplasty rehab protocol. Plan is to go Home after hospital stay. DVT Prophylaxis - Lovenox, Foot Pumps and TED hose  James P. Holley Bouche M.D.

## 2018-04-06 LAB — GLUCOSE, CAPILLARY
Glucose-Capillary: 155 mg/dL — ABNORMAL HIGH (ref 65–99)
Glucose-Capillary: 191 mg/dL — ABNORMAL HIGH (ref 65–99)
Glucose-Capillary: 171 mg/dL — ABNORMAL HIGH (ref 65–99)

## 2018-04-06 LAB — SURGICAL PATHOLOGY

## 2018-04-06 MED ORDER — LACTULOSE 10 GM/15ML PO SOLN
10.0000 g | Freq: Two times a day (BID) | ORAL | Status: DC | PRN
  Administered 2018-04-06: 10 g via ORAL
  Filled 2018-04-06: qty 30

## 2018-04-06 NOTE — Progress Notes (Signed)
   Subjective: 2 Days Post-Op Procedure(s) (LRB): TOTAL HIP ARTHROPLASTY (Right) Patient reports pain as mild.  Complains of being more sore than anything. Patient is well, and has had no acute complaints or problems Did well with therapy yesterday. Walked 125 ft Plan is to go Home after hospital stay. no nausea and no vomiting Patient denies any chest pains or shortness of breath. Objective: Vital signs in last 24 hours: Temp:  [97.8 F (36.6 C)-97.9 F (36.6 C)] 97.9 F (36.6 C) (04/23 2232) Pulse Rate:  [68-78] 68 (04/23 2232) Resp:  [19] 19 (04/23 2232) BP: (125-148)/(68-78) 138/68 (04/23 2232) SpO2:  [93 %-100 %] 97 % (04/23 2232) well approximated incision Heels are non tender and elevated off the bed using rolled towels Intake/Output from previous day: 04/23 0701 - 04/24 0700 In: 1213.3 [I.V.:913.3; IV Piggyback:300] Out: 180 [Drains:180] Intake/Output this shift: Total I/O In: -  Out: 90 [Drains:90]  No results for input(s): HGB in the last 72 hours. No results for input(s): WBC, RBC, HCT, PLT in the last 72 hours. No results for input(s): NA, K, CL, CO2, BUN, CREATININE, GLUCOSE, CALCIUM in the last 72 hours. No results for input(s): LABPT, INR in the last 72 hours.  EXAM General - Patient is Alert, Appropriate and Oriented Extremity - Neurologically intact Neurovascular intact Sensation intact distally Intact pulses distally Dorsiflexion/Plantar flexion intact No cellulitis present Compartment soft Dressing - scant drainage Motor Function - intact, moving foot and toes well on exam.    Past Medical History:  Diagnosis Date  . Anxiety   . Arthritis   . Cataract    left  . Depression   . Diabetes mellitus without complication ([place])   . Environmental and seasonal allergies   . GERD (gastroesophageal reflux disease)   . Hyperlipidemia   . Hypertension   . Joint pain    as reported by patient  . Urinary incontinence    as stated by patient     Assessment/Plan: 2 Days Post-Op Procedure(s) (LRB): TOTAL HIP ARTHROPLASTY (Right) Active Problems:   Status post total replacement of hip  Estimated body mass index is 33.25 kg/m as calculated from the following:   Height as of this encounter: 5\' 6"  (1.676 m).   Weight as of this encounter: 93.4 kg (206 lb). Up with therapy Discharge home with home health  Labs: none DVT Prophylaxis - Lovenox, Foot Pumps and TED hose Weight-Bearing as tolerated to right leg hemovac discontinued today. End of the drain appeared to be intact. Pt may be discharged to home after she has a bowel movement, does the lap around the station and does steps Please change dressing prior to d/c and give the pt 2 extra honeycomb dressing to take home.  Jillyn Ledger. [place] 04/06/2018, 6:29 AM

## 2018-04-06 NOTE — Progress Notes (Signed)
Occupational Therapy Treatment Patient Details Name: [name] MRN: [id number] DOB: 1956/09/20 Today's Date: 04/06/2018    History of present illness 62 y/o female s/p R THR (posterior approach) 04/04/18.   OT comments  Pt seen for OT treatment session this am, focused on posterior THPs and how to implement during functional mobility and ADL tasks in order to maximize safety and independence. Educated in car transfer techniques as well and reviewed handout on precautions. Pt able to verbalize 1/3 precautions at start of session and unable to verbalize how to implement. By end of session, pt able to recall 3/3 and verbalize to therapist how to implement during functional mobility, ADL, and sleep. Pt continues to benefit from skilled OT services. Continue to recommend [place], notified RNCM.   Follow Up Recommendations  Home health OT    Equipment Recommendations  3 in 1 bedside commode;Other (comment)(reacher)    Recommendations for Other Services      Precautions / Restrictions Precautions Precautions: Posterior Hip;Fall Precaution Booklet Issued: No Restrictions Weight Bearing Restrictions: Yes RLE Weight Bearing: Weight bearing as tolerated       Mobility Bed Mobility Overal bed mobility: Modified Independent Bed Mobility: Supine to Sit     Supine to sit: Modified independent (Device/Increase time)     General bed mobility comments: no physical assist required  Transfers Overall transfer level: Needs assistance Equipment used: Rolling walker (2 wheeled) Transfers: Sit to/from Stand Sit to Stand: Min guard         General transfer comment: initial transfer required CGA, out of bed and min verbal cues for hand and foot placement, with improved technique noted upon further transfers    Balance                                           ADL either performed or assessed with clinical judgement   ADL Overall ADL's : Needs assistance/impaired                        Lower Body Dressing Details (indicate cue type and reason): pt/family educated in use of AE for LB dressing                      Vision Patient Visual Report: No change from baseline     Perception     Praxis      Cognition Arousal/Alertness: Awake/alert Behavior During Therapy: WFL for tasks assessed/performed Overall Cognitive Status: Within Functional Limits for tasks assessed                                          Exercises Other Exercises Other Exercises: Pt instructed in posterior THPs and how to implement during mobility and ADL tasks, pt able to teach back learned techniques   Shoulder Instructions       General Comments      Pertinent Vitals/ Pain       Pain Assessment: No/denies pain  Home Living Family/patient expects to be discharged to:: Private residence                                        Prior Functioning/Environment  Frequency  Min 1X/week        Progress Toward Goals  OT Goals(current goals can now be found in the care plan section)  Progress towards OT goals: Progressing toward goals  Acute Rehab OT Goals Patient Stated Goal: Go home and then return to work OT Goal Formulation: With patient/family Time For Goal Achievement: 04/19/18 Potential to Achieve Goals: Good  Plan Discharge plan remains appropriate;Frequency remains appropriate    Co-evaluation                 AM-PAC PT "6 Clicks" Daily Activity     Outcome Measure   Help from another person eating meals?: None Help from another person taking care of personal grooming?: None Help from another person toileting, which includes using toliet, bedpan, or urinal?: A Little Help from another person bathing (including washing, rinsing, drying)?: A Lot Help from another person to put on and taking off regular upper body clothing?: None Help from another person to put on and taking off  regular lower body clothing?: A Lot 6 Click Score: 19    End of Session    OT Visit Diagnosis: Other abnormalities of gait and mobility (R26.89)   Activity Tolerance Patient tolerated treatment well   Patient Left in chair;with call bell/phone within reach;with chair alarm set   Nurse Communication          Time: 3662-9476 OT Time Calculation (min): 14 min  Charges: OT General Charges $OT Visit: 1 Visit OT Treatments $Self Care/Home Management : 8-22 mins  Jeni Salles, MPH, MS, OTR/L ascom [phone] 04/06/18, 9:32 AM

## 2018-04-06 NOTE — Care Management Note (Signed)
Case Management Note  Patient Details  Name: [name] MRN: [id number] Date of Birth: 1956-05-27  Subjective/Objective:   Discharging today                 Action/Plan: [facility] notified of discharge. DME delivered. Cost of Lovenox is $ 15.00. Patient updated.    Expected Discharge Date:  04/06/18               Expected Discharge Plan:  [place]  In-House Referral:     Discharge planning Services  CM Consult  Post Acute Care Choice:  Durable Medical Equipment, Home Health Choice offered to:  Patient  DME Arranged:  Bedside commode, Walker rolling DME Agency:  [place]:  PT, OT [place] Agency:  [facility] (formerly [facility])  Status of Service:  Completed, signed off  If discussed at [employer], dates discussed:    Additional Comments:  Jolly Mango, RN 04/06/2018, 9:50 AM

## 2018-04-06 NOTE — Progress Notes (Signed)
Physical Therapy Treatment Patient Details Name: [name] MRN: [id number] DOB: 1956-04-21 Today's Date: 04/06/2018    History of Present Illness 62 y/o female s/p R THR (posterior approach) 04/04/18.    PT Comments    Pt sleepy this am.  Stood and was able to ambulate to rehab gym and back with walker and min guard/supervision.  Stair training complete. To discharge to daughters home with 2 steps on sidewalk with no rails.  Backwards technique performed by pt.  Daughter in for education also.  Navigated well.  HEP review.  Pt stated she was too tired to complete now so ex sheets were reviewed.  Pt with no further questions or concerns regarding discharge.   Follow Up Recommendations  Home health PT     Equipment Recommendations  Rolling walker with 5" wheels;3in1 (PT)    Recommendations for Other Services       Precautions / Restrictions Precautions Precautions: Posterior Hip;Fall Precaution Booklet Issued: No Restrictions Weight Bearing Restrictions: Yes RLE Weight Bearing: Weight bearing as tolerated    Mobility  Bed Mobility Overal bed mobility: Modified Independent Bed Mobility: Sit to Supine       Sit to supine: Min guard   General bed mobility comments: no physical assist required  Transfers Overall transfer level: Needs assistance Equipment used: Rolling walker (2 wheeled) Transfers: Sit to/from Stand Sit to Stand: Min guard            Ambulation/Gait Ambulation/Gait assistance: Min guard Ambulation Distance (Feet): 200 Feet Assistive device: Rolling walker (2 wheeled) Gait Pattern/deviations: Step-through pattern;Decreased step length - right;Decreased step length - left     General Gait Details: decreased   Stairs Stairs: Yes Stairs assistance: Min guard Stair Management: Backwards;With walker Number of Stairs: 4 General stair comments: has 2 steps on sidewalk with no rails into daughters home.  navigated well.  daughter in for  instructions.   Wheelchair Mobility    Modified Rankin (Stroke Patients Only)       Balance Overall balance assessment: Modified Independent                                          Cognition Arousal/Alertness: Awake/alert Behavior During Therapy: WFL for tasks assessed/performed Overall Cognitive Status: Within Functional Limits for tasks assessed                                        Exercises Other Exercises Other Exercises: verbal review using exercise sheets for HEP.  Pt reported fatigue and wanted verbal review only.    General Comments        Pertinent Vitals/Pain Pain Assessment: 0-10 Pain Score: 2  Pain Descriptors / Indicators: Operative site guarding;Sore Pain Intervention(s): Limited activity within patient's tolerance;Monitored during session    Home Living                      Prior Function            PT Goals (current goals can now be found in the care plan section) Progress towards PT goals: Progressing toward goals    Frequency    BID      PT Plan Current plan remains appropriate    Co-evaluation  AM-PAC PT "6 Clicks" Daily Activity  Outcome Measure  Difficulty turning over in bed (including adjusting bedclothes, sheets and blankets)?: None Difficulty moving from lying on back to sitting on the side of the bed? : None Difficulty sitting down on and standing up from a chair with arms (e.g., wheelchair, bedside commode, etc,.)?: A Little Help needed moving to and from a bed to chair (including a wheelchair)?: None Help needed walking in hospital room?: None Help needed climbing 3-5 steps with a railing? : A Little 6 Click Score: 22    End of Session Equipment Utilized During Treatment: Gait belt Activity Tolerance: Patient tolerated treatment well Patient left: with bed alarm set;with call bell/phone within reach;in bed         Time: 4492-0100 PT Time Calculation  (min) (ACUTE ONLY): 26 min  Charges:  $Gait Training: 8-22 mins $Therapeutic Exercise: 8-22 mins                    G Codes:       Chesley Noon, PTA 04/06/18, 1:42 PM

## 2018-04-06 NOTE — Progress Notes (Signed)
RN explained discharge instructions to pt and instructed pt on Lovenox injection. Pt verbalized understanding. VSS. Pt Wheeled to visitors entrance and assisted into daughters vehicle.

## 2018-04-07 ENCOUNTER — Other Ambulatory Visit: Payer: Self-pay

## 2018-04-07 NOTE — Patient Outreach (Signed)
[facility]) Care Management  04/07/2018  ZAKHIA SERES [birth year] [id number]   Telephone call for transition of care call.  Member was hospitalized for rt total hip arthoplasty on 04/04/18 and discharged on 04/06/18.  Objective: Member states that she is staying with her daughter Erasmo Downer while she is recovering.  States she has been able to walk to the bathroom using her walker.  States she has not had a call from the home health agency.  States that her daughter has gone to pick up her medications.  States she is taking her pain medication every 4-6 hours and she feels it is being controlled well.  States she has an appt with her surgeon on 05/17/18 and she does not have an appt with her primary care provider.  Objective: S/P rt total hip replacement, Hx of osteoarthritis of rt hip,  Type 2 DM, HTN and hypercholesteremia per medical record.  Assessment: Transition of care call completed.  See transition of care flow sheet. [facility] and verified that member has been referred to them and they plan to call her later this morning.  Instructed member to expect a call from [facility] and gave her number to reach them if needed. Instructed to call her primary care provider to schedule follow up appt and reinforced to keep appt with surgeon on 05/17/18 Reviewed medications and members daughter is picking up medication at [place] Reviewed use of enoxaparin injections and she voices confidence in ability to use Reviewed discharge instructions and s/s to call MD Reviewed benefits and member has filed for [employer] Leave forms, short term disability and hospital indemnity policy Member enrolled in the [facility] program for diabetes management  Plan: Plan to send successful outreach letter Plan to close case.  Member assessed with no further interventions needed  Peter Garter RN, [place] Management Coordinator [facility] Care Management [phone]

## 2018-04-08 DIAGNOSIS — N393 Stress incontinence (female) (male): Secondary | ICD-10-CM | POA: Diagnosis not present

## 2018-04-08 DIAGNOSIS — I10 Essential (primary) hypertension: Secondary | ICD-10-CM | POA: Diagnosis not present

## 2018-04-08 DIAGNOSIS — F419 Anxiety disorder, unspecified: Secondary | ICD-10-CM | POA: Diagnosis not present

## 2018-04-08 DIAGNOSIS — E119 Type 2 diabetes mellitus without complications: Secondary | ICD-10-CM | POA: Diagnosis not present

## 2018-04-08 DIAGNOSIS — E7800 Pure hypercholesterolemia, unspecified: Secondary | ICD-10-CM | POA: Diagnosis not present

## 2018-04-08 DIAGNOSIS — Z471 Aftercare following joint replacement surgery: Secondary | ICD-10-CM | POA: Diagnosis not present

## 2018-04-08 DIAGNOSIS — F430 Acute stress reaction: Secondary | ICD-10-CM | POA: Diagnosis not present

## 2018-04-08 DIAGNOSIS — F329 Major depressive disorder, single episode, unspecified: Secondary | ICD-10-CM | POA: Diagnosis not present

## 2018-04-08 DIAGNOSIS — K5730 Diverticulosis of large intestine without perforation or abscess without bleeding: Secondary | ICD-10-CM | POA: Diagnosis not present

## 2018-04-11 DIAGNOSIS — K5730 Diverticulosis of large intestine without perforation or abscess without bleeding: Secondary | ICD-10-CM | POA: Diagnosis not present

## 2018-04-11 DIAGNOSIS — Z471 Aftercare following joint replacement surgery: Secondary | ICD-10-CM | POA: Diagnosis not present

## 2018-04-11 DIAGNOSIS — F430 Acute stress reaction: Secondary | ICD-10-CM | POA: Diagnosis not present

## 2018-04-11 DIAGNOSIS — E7800 Pure hypercholesterolemia, unspecified: Secondary | ICD-10-CM | POA: Diagnosis not present

## 2018-04-11 DIAGNOSIS — F419 Anxiety disorder, unspecified: Secondary | ICD-10-CM | POA: Diagnosis not present

## 2018-04-11 DIAGNOSIS — I10 Essential (primary) hypertension: Secondary | ICD-10-CM | POA: Diagnosis not present

## 2018-04-11 DIAGNOSIS — F329 Major depressive disorder, single episode, unspecified: Secondary | ICD-10-CM | POA: Diagnosis not present

## 2018-04-11 DIAGNOSIS — E119 Type 2 diabetes mellitus without complications: Secondary | ICD-10-CM | POA: Diagnosis not present

## 2018-04-11 DIAGNOSIS — N393 Stress incontinence (female) (male): Secondary | ICD-10-CM | POA: Diagnosis not present

## 2018-04-13 DIAGNOSIS — E7800 Pure hypercholesterolemia, unspecified: Secondary | ICD-10-CM | POA: Diagnosis not present

## 2018-04-13 DIAGNOSIS — Z471 Aftercare following joint replacement surgery: Secondary | ICD-10-CM | POA: Diagnosis not present

## 2018-04-13 DIAGNOSIS — F329 Major depressive disorder, single episode, unspecified: Secondary | ICD-10-CM | POA: Diagnosis not present

## 2018-04-13 DIAGNOSIS — E119 Type 2 diabetes mellitus without complications: Secondary | ICD-10-CM | POA: Diagnosis not present

## 2018-04-13 DIAGNOSIS — N393 Stress incontinence (female) (male): Secondary | ICD-10-CM | POA: Diagnosis not present

## 2018-04-13 DIAGNOSIS — I10 Essential (primary) hypertension: Secondary | ICD-10-CM | POA: Diagnosis not present

## 2018-04-13 DIAGNOSIS — K5730 Diverticulosis of large intestine without perforation or abscess without bleeding: Secondary | ICD-10-CM | POA: Diagnosis not present

## 2018-04-13 DIAGNOSIS — F430 Acute stress reaction: Secondary | ICD-10-CM | POA: Diagnosis not present

## 2018-04-13 DIAGNOSIS — F419 Anxiety disorder, unspecified: Secondary | ICD-10-CM | POA: Diagnosis not present

## 2018-04-14 ENCOUNTER — Ambulatory Visit (INDEPENDENT_AMBULATORY_CARE_PROVIDER_SITE_OTHER): Payer: 59 | Admitting: Family Medicine

## 2018-04-14 ENCOUNTER — Ambulatory Visit: Payer: 59 | Admitting: Physician Assistant

## 2018-04-14 ENCOUNTER — Encounter: Payer: Self-pay | Admitting: Family Medicine

## 2018-04-14 VITALS — BP 132/80 | HR 84 | Temp 98.5°F | Resp 16 | Wt 201.6 lb

## 2018-04-14 DIAGNOSIS — J301 Allergic rhinitis due to pollen: Secondary | ICD-10-CM

## 2018-04-14 DIAGNOSIS — R05 Cough: Secondary | ICD-10-CM

## 2018-04-14 DIAGNOSIS — R059 Cough, unspecified: Secondary | ICD-10-CM

## 2018-04-14 MED ORDER — HYDROCODONE-HOMATROPINE 5-1.5 MG/5ML PO SYRP: 5.0000 mL | ORAL_SOLUTION | Freq: Four times a day (QID) | ORAL | 0 refills | Status: AC | PRN

## 2018-04-14 NOTE — Progress Notes (Signed)
  Subjective:     Patient ID: [name], female   DOB: 1956-04-11, 62 y.o.   MRN: [id number] Chief Complaint  Patient presents with  . Cough    Patient comes in office today with complaints of productive cough for less than 5 days. Patient states that her cough is productive of white/green mucous and has caused her to have nausea and vomit. Patient reports that she is currently sleeping in a recliner do to recent hip surgery and dosent know if laying down is what is triggering cough.    HPI Reports she does have a hx of seasonal allergies. States she wishes a cough syrup as her coughing affects her physical therapy and exacerbates stress incontinence. She is not using the pain meds frequently. States she is doing well with the rolling walker FWB. Does not have a date for suture removal and suggested she call Dr. Clydell Hakim office to schedule this.  Review of Systems     Objective:   Physical Exam  Constitutional: She appears well-developed and well-nourished. No distress.  Ears: T.M's intact without inflammation Throat: no tonsillar enlargement or exudate Neck: no cervical adenopathy Lungs: clear     Assessment:    1. Cough - HYDROcodone-homatropine (HYCODAN) 5-1.5 MG/5ML syrup; Take 5 mLs by mouth every 6 (six) hours as needed for up to 5 days. 5 ml 4-6 hours as needed for cough  Dispense: 100 mL; Refill: 0  2. Seasonal allergic rhinitis due to pollen    Plan:    Add Claritin. Do not use the cough syrup and pain medication together.

## 2018-04-14 NOTE — Patient Instructions (Signed)
Discussed use of Clartin. Do not use the pain medication with the cough syrup.

## 2018-04-15 DIAGNOSIS — F419 Anxiety disorder, unspecified: Secondary | ICD-10-CM | POA: Diagnosis not present

## 2018-04-15 DIAGNOSIS — N393 Stress incontinence (female) (male): Secondary | ICD-10-CM | POA: Diagnosis not present

## 2018-04-15 DIAGNOSIS — I10 Essential (primary) hypertension: Secondary | ICD-10-CM | POA: Diagnosis not present

## 2018-04-15 DIAGNOSIS — F329 Major depressive disorder, single episode, unspecified: Secondary | ICD-10-CM | POA: Diagnosis not present

## 2018-04-15 DIAGNOSIS — K5730 Diverticulosis of large intestine without perforation or abscess without bleeding: Secondary | ICD-10-CM | POA: Diagnosis not present

## 2018-04-15 DIAGNOSIS — E7800 Pure hypercholesterolemia, unspecified: Secondary | ICD-10-CM | POA: Diagnosis not present

## 2018-04-15 DIAGNOSIS — F430 Acute stress reaction: Secondary | ICD-10-CM | POA: Diagnosis not present

## 2018-04-15 DIAGNOSIS — E119 Type 2 diabetes mellitus without complications: Secondary | ICD-10-CM | POA: Diagnosis not present

## 2018-04-15 DIAGNOSIS — Z471 Aftercare following joint replacement surgery: Secondary | ICD-10-CM | POA: Diagnosis not present

## 2018-04-18 DIAGNOSIS — Z471 Aftercare following joint replacement surgery: Secondary | ICD-10-CM | POA: Diagnosis not present

## 2018-04-18 DIAGNOSIS — N393 Stress incontinence (female) (male): Secondary | ICD-10-CM | POA: Diagnosis not present

## 2018-04-18 DIAGNOSIS — E7800 Pure hypercholesterolemia, unspecified: Secondary | ICD-10-CM | POA: Diagnosis not present

## 2018-04-18 DIAGNOSIS — F430 Acute stress reaction: Secondary | ICD-10-CM | POA: Diagnosis not present

## 2018-04-18 DIAGNOSIS — K5730 Diverticulosis of large intestine without perforation or abscess without bleeding: Secondary | ICD-10-CM | POA: Diagnosis not present

## 2018-04-18 DIAGNOSIS — I10 Essential (primary) hypertension: Secondary | ICD-10-CM | POA: Diagnosis not present

## 2018-04-18 DIAGNOSIS — F329 Major depressive disorder, single episode, unspecified: Secondary | ICD-10-CM | POA: Diagnosis not present

## 2018-04-18 DIAGNOSIS — E119 Type 2 diabetes mellitus without complications: Secondary | ICD-10-CM | POA: Diagnosis not present

## 2018-04-18 DIAGNOSIS — F419 Anxiety disorder, unspecified: Secondary | ICD-10-CM | POA: Diagnosis not present

## 2018-04-20 DIAGNOSIS — F419 Anxiety disorder, unspecified: Secondary | ICD-10-CM | POA: Diagnosis not present

## 2018-04-20 DIAGNOSIS — N393 Stress incontinence (female) (male): Secondary | ICD-10-CM | POA: Diagnosis not present

## 2018-04-20 DIAGNOSIS — E119 Type 2 diabetes mellitus without complications: Secondary | ICD-10-CM | POA: Diagnosis not present

## 2018-04-20 DIAGNOSIS — Z471 Aftercare following joint replacement surgery: Secondary | ICD-10-CM | POA: Diagnosis not present

## 2018-04-20 DIAGNOSIS — K5730 Diverticulosis of large intestine without perforation or abscess without bleeding: Secondary | ICD-10-CM | POA: Diagnosis not present

## 2018-04-20 DIAGNOSIS — F329 Major depressive disorder, single episode, unspecified: Secondary | ICD-10-CM | POA: Diagnosis not present

## 2018-04-20 DIAGNOSIS — I10 Essential (primary) hypertension: Secondary | ICD-10-CM | POA: Diagnosis not present

## 2018-04-20 DIAGNOSIS — E7800 Pure hypercholesterolemia, unspecified: Secondary | ICD-10-CM | POA: Diagnosis not present

## 2018-04-20 DIAGNOSIS — F430 Acute stress reaction: Secondary | ICD-10-CM | POA: Diagnosis not present

## 2018-04-21 DIAGNOSIS — F329 Major depressive disorder, single episode, unspecified: Secondary | ICD-10-CM | POA: Diagnosis not present

## 2018-04-21 DIAGNOSIS — E119 Type 2 diabetes mellitus without complications: Secondary | ICD-10-CM | POA: Diagnosis not present

## 2018-04-21 DIAGNOSIS — K5730 Diverticulosis of large intestine without perforation or abscess without bleeding: Secondary | ICD-10-CM | POA: Diagnosis not present

## 2018-04-21 DIAGNOSIS — N393 Stress incontinence (female) (male): Secondary | ICD-10-CM | POA: Diagnosis not present

## 2018-04-21 DIAGNOSIS — F430 Acute stress reaction: Secondary | ICD-10-CM | POA: Diagnosis not present

## 2018-04-21 DIAGNOSIS — E7800 Pure hypercholesterolemia, unspecified: Secondary | ICD-10-CM | POA: Diagnosis not present

## 2018-04-21 DIAGNOSIS — F419 Anxiety disorder, unspecified: Secondary | ICD-10-CM | POA: Diagnosis not present

## 2018-04-21 DIAGNOSIS — Z471 Aftercare following joint replacement surgery: Secondary | ICD-10-CM | POA: Diagnosis not present

## 2018-04-21 DIAGNOSIS — I10 Essential (primary) hypertension: Secondary | ICD-10-CM | POA: Diagnosis not present

## 2018-04-25 ENCOUNTER — Other Ambulatory Visit: Payer: Self-pay | Admitting: Physician Assistant

## 2018-04-25 DIAGNOSIS — F329 Major depressive disorder, single episode, unspecified: Secondary | ICD-10-CM | POA: Diagnosis not present

## 2018-04-25 DIAGNOSIS — Z471 Aftercare following joint replacement surgery: Secondary | ICD-10-CM | POA: Diagnosis not present

## 2018-04-25 DIAGNOSIS — E1165 Type 2 diabetes mellitus with hyperglycemia: Principal | ICD-10-CM

## 2018-04-25 DIAGNOSIS — K5730 Diverticulosis of large intestine without perforation or abscess without bleeding: Secondary | ICD-10-CM | POA: Diagnosis not present

## 2018-04-25 DIAGNOSIS — IMO0002 Reserved for concepts with insufficient information to code with codable children: Secondary | ICD-10-CM

## 2018-04-25 DIAGNOSIS — I10 Essential (primary) hypertension: Secondary | ICD-10-CM | POA: Diagnosis not present

## 2018-04-25 DIAGNOSIS — N393 Stress incontinence (female) (male): Secondary | ICD-10-CM | POA: Diagnosis not present

## 2018-04-25 DIAGNOSIS — E1142 Type 2 diabetes mellitus with diabetic polyneuropathy: Secondary | ICD-10-CM

## 2018-04-25 DIAGNOSIS — F419 Anxiety disorder, unspecified: Secondary | ICD-10-CM | POA: Diagnosis not present

## 2018-04-25 DIAGNOSIS — F430 Acute stress reaction: Secondary | ICD-10-CM | POA: Diagnosis not present

## 2018-04-25 DIAGNOSIS — E119 Type 2 diabetes mellitus without complications: Secondary | ICD-10-CM | POA: Diagnosis not present

## 2018-04-25 DIAGNOSIS — E7800 Pure hypercholesterolemia, unspecified: Secondary | ICD-10-CM | POA: Diagnosis not present

## 2018-04-26 DIAGNOSIS — I10 Essential (primary) hypertension: Secondary | ICD-10-CM | POA: Diagnosis not present

## 2018-04-26 DIAGNOSIS — F419 Anxiety disorder, unspecified: Secondary | ICD-10-CM | POA: Diagnosis not present

## 2018-04-26 DIAGNOSIS — E7800 Pure hypercholesterolemia, unspecified: Secondary | ICD-10-CM | POA: Diagnosis not present

## 2018-04-26 DIAGNOSIS — E119 Type 2 diabetes mellitus without complications: Secondary | ICD-10-CM | POA: Diagnosis not present

## 2018-04-26 DIAGNOSIS — F430 Acute stress reaction: Secondary | ICD-10-CM | POA: Diagnosis not present

## 2018-04-26 DIAGNOSIS — Z471 Aftercare following joint replacement surgery: Secondary | ICD-10-CM | POA: Diagnosis not present

## 2018-04-26 DIAGNOSIS — F329 Major depressive disorder, single episode, unspecified: Secondary | ICD-10-CM | POA: Diagnosis not present

## 2018-04-26 DIAGNOSIS — K5730 Diverticulosis of large intestine without perforation or abscess without bleeding: Secondary | ICD-10-CM | POA: Diagnosis not present

## 2018-04-26 DIAGNOSIS — N393 Stress incontinence (female) (male): Secondary | ICD-10-CM | POA: Diagnosis not present

## 2018-04-29 DIAGNOSIS — Z471 Aftercare following joint replacement surgery: Secondary | ICD-10-CM | POA: Diagnosis not present

## 2018-04-29 DIAGNOSIS — N393 Stress incontinence (female) (male): Secondary | ICD-10-CM | POA: Diagnosis not present

## 2018-04-29 DIAGNOSIS — E119 Type 2 diabetes mellitus without complications: Secondary | ICD-10-CM | POA: Diagnosis not present

## 2018-04-29 DIAGNOSIS — F419 Anxiety disorder, unspecified: Secondary | ICD-10-CM | POA: Diagnosis not present

## 2018-04-29 DIAGNOSIS — I10 Essential (primary) hypertension: Secondary | ICD-10-CM | POA: Diagnosis not present

## 2018-04-29 DIAGNOSIS — F430 Acute stress reaction: Secondary | ICD-10-CM | POA: Diagnosis not present

## 2018-04-29 DIAGNOSIS — K5730 Diverticulosis of large intestine without perforation or abscess without bleeding: Secondary | ICD-10-CM | POA: Diagnosis not present

## 2018-04-29 DIAGNOSIS — F329 Major depressive disorder, single episode, unspecified: Secondary | ICD-10-CM | POA: Diagnosis not present

## 2018-04-29 DIAGNOSIS — E7800 Pure hypercholesterolemia, unspecified: Secondary | ICD-10-CM | POA: Diagnosis not present

## 2018-05-02 DIAGNOSIS — F419 Anxiety disorder, unspecified: Secondary | ICD-10-CM | POA: Diagnosis not present

## 2018-05-02 DIAGNOSIS — E7800 Pure hypercholesterolemia, unspecified: Secondary | ICD-10-CM | POA: Diagnosis not present

## 2018-05-02 DIAGNOSIS — I10 Essential (primary) hypertension: Secondary | ICD-10-CM | POA: Diagnosis not present

## 2018-05-02 DIAGNOSIS — F430 Acute stress reaction: Secondary | ICD-10-CM | POA: Diagnosis not present

## 2018-05-02 DIAGNOSIS — K5730 Diverticulosis of large intestine without perforation or abscess without bleeding: Secondary | ICD-10-CM | POA: Diagnosis not present

## 2018-05-02 DIAGNOSIS — F329 Major depressive disorder, single episode, unspecified: Secondary | ICD-10-CM | POA: Diagnosis not present

## 2018-05-02 DIAGNOSIS — Z471 Aftercare following joint replacement surgery: Secondary | ICD-10-CM | POA: Diagnosis not present

## 2018-05-02 DIAGNOSIS — N393 Stress incontinence (female) (male): Secondary | ICD-10-CM | POA: Diagnosis not present

## 2018-05-02 DIAGNOSIS — E119 Type 2 diabetes mellitus without complications: Secondary | ICD-10-CM | POA: Diagnosis not present

## 2018-05-04 DIAGNOSIS — F419 Anxiety disorder, unspecified: Secondary | ICD-10-CM | POA: Diagnosis not present

## 2018-05-04 DIAGNOSIS — Z471 Aftercare following joint replacement surgery: Secondary | ICD-10-CM | POA: Diagnosis not present

## 2018-05-04 DIAGNOSIS — F430 Acute stress reaction: Secondary | ICD-10-CM | POA: Diagnosis not present

## 2018-05-04 DIAGNOSIS — E119 Type 2 diabetes mellitus without complications: Secondary | ICD-10-CM | POA: Diagnosis not present

## 2018-05-04 DIAGNOSIS — K5730 Diverticulosis of large intestine without perforation or abscess without bleeding: Secondary | ICD-10-CM | POA: Diagnosis not present

## 2018-05-04 DIAGNOSIS — I10 Essential (primary) hypertension: Secondary | ICD-10-CM | POA: Diagnosis not present

## 2018-05-04 DIAGNOSIS — F329 Major depressive disorder, single episode, unspecified: Secondary | ICD-10-CM | POA: Diagnosis not present

## 2018-05-04 DIAGNOSIS — E7800 Pure hypercholesterolemia, unspecified: Secondary | ICD-10-CM | POA: Diagnosis not present

## 2018-05-04 DIAGNOSIS — N393 Stress incontinence (female) (male): Secondary | ICD-10-CM | POA: Diagnosis not present

## 2018-05-06 DIAGNOSIS — E7800 Pure hypercholesterolemia, unspecified: Secondary | ICD-10-CM | POA: Diagnosis not present

## 2018-05-06 DIAGNOSIS — I10 Essential (primary) hypertension: Secondary | ICD-10-CM | POA: Diagnosis not present

## 2018-05-06 DIAGNOSIS — F329 Major depressive disorder, single episode, unspecified: Secondary | ICD-10-CM | POA: Diagnosis not present

## 2018-05-06 DIAGNOSIS — E119 Type 2 diabetes mellitus without complications: Secondary | ICD-10-CM | POA: Diagnosis not present

## 2018-05-06 DIAGNOSIS — F430 Acute stress reaction: Secondary | ICD-10-CM | POA: Diagnosis not present

## 2018-05-06 DIAGNOSIS — Z471 Aftercare following joint replacement surgery: Secondary | ICD-10-CM | POA: Diagnosis not present

## 2018-05-06 DIAGNOSIS — K5730 Diverticulosis of large intestine without perforation or abscess without bleeding: Secondary | ICD-10-CM | POA: Diagnosis not present

## 2018-05-06 DIAGNOSIS — N393 Stress incontinence (female) (male): Secondary | ICD-10-CM | POA: Diagnosis not present

## 2018-05-06 DIAGNOSIS — F419 Anxiety disorder, unspecified: Secondary | ICD-10-CM | POA: Diagnosis not present

## 2018-05-17 DIAGNOSIS — Z96641 Presence of right artificial hip joint: Secondary | ICD-10-CM | POA: Diagnosis not present

## 2018-05-23 DIAGNOSIS — Z471 Aftercare following joint replacement surgery: Secondary | ICD-10-CM | POA: Diagnosis not present

## 2018-05-31 ENCOUNTER — Other Ambulatory Visit: Payer: Self-pay | Admitting: Physician Assistant

## 2018-05-31 DIAGNOSIS — E1122 Type 2 diabetes mellitus with diabetic chronic kidney disease: Secondary | ICD-10-CM

## 2018-05-31 DIAGNOSIS — N182 Chronic kidney disease, stage 2 (mild): Secondary | ICD-10-CM

## 2018-06-13 ENCOUNTER — Ambulatory Visit: Payer: 59 | Admitting: Family Medicine

## 2018-06-13 ENCOUNTER — Encounter: Payer: Self-pay | Admitting: Family Medicine

## 2018-06-13 VITALS — BP 144/100 | HR 89 | Temp 98.5°F | Resp 16 | Wt 204.8 lb

## 2018-06-13 DIAGNOSIS — N3090 Cystitis, unspecified without hematuria: Secondary | ICD-10-CM | POA: Diagnosis not present

## 2018-06-13 LAB — POCT URINALYSIS DIPSTICK
Glucose, UA: NEGATIVE
Ketones, UA: NEGATIVE
Nitrite, UA: NEGATIVE
Protein, UA: POSITIVE — AB
Spec Grav, UA: >=1.03 — AB (ref 1.010–1.025)
Urobilinogen, UA: 0.2 E.U./dL
pH, UA: 7 (ref 5.0–8.0)

## 2018-06-13 MED ORDER — CEPHALEXIN 500 MG PO CAPS: 500.0000 mg | ORAL_CAPSULE | Freq: Two times a day (BID) | ORAL | 0 refills | Status: DC

## 2018-06-13 NOTE — Progress Notes (Signed)
  Subjective:     Patient ID: [name], female   DOB: January 09, 1956, 61 y.o.   MRN: [id number] Chief Complaint  Patient presents with  . Dysuria    Patient comes in office today with complaints of pain and pressure when urinating for the past 2 days.    HPI States it has been 2-3 years since she has had a UTI: "I haven't been drinking enough water." Reports a one time episode of rash with PCN as an adult but believes she has tolerated cephalosporins.  Review of Systems     Objective:   Physical Exam  Constitutional: She appears well-developed and well-nourished. No distress.  Genitourinary:  Genitourinary Comments: No cva tenderness       Assessment:    1. Cystitis - POCT urinalysis dipstick - Urine Culture    Plan:    Further f/u pending urine culture. Call if not tolerating the abx.

## 2018-06-13 NOTE — Patient Instructions (Signed)
We will call you with the urine culture results. Let me know if you can't tolerate the antibiotiic

## 2018-06-16 LAB — URINE CULTURE

## 2018-06-17 ENCOUNTER — Telehealth: Payer: Self-pay

## 2018-06-17 NOTE — Telephone Encounter (Signed)
Pt advised and agrees with plan. 

## 2018-06-17 NOTE — Telephone Encounter (Signed)
-----   Message from Carmon Ginsberg, [place] sent at 06/17/2018  7:44 AM EDT ----- Continue cephalexin for a Klebsiella infection.

## 2018-06-17 NOTE — Telephone Encounter (Signed)
[id number] 

## 2018-08-16 ENCOUNTER — Other Ambulatory Visit: Payer: Self-pay | Admitting: Physician Assistant

## 2018-08-16 DIAGNOSIS — F419 Anxiety disorder, unspecified: Secondary | ICD-10-CM

## 2018-09-19 ENCOUNTER — Ambulatory Visit (INDEPENDENT_AMBULATORY_CARE_PROVIDER_SITE_OTHER): Payer: Self-pay | Admitting: Physician Assistant

## 2018-09-19 ENCOUNTER — Encounter: Payer: Self-pay | Admitting: Physician Assistant

## 2018-09-19 VITALS — BP 180/100 | HR 72 | Temp 98.1°F | Wt 209.0 lb

## 2018-09-19 DIAGNOSIS — J302 Other seasonal allergic rhinitis: Secondary | ICD-10-CM

## 2018-09-19 DIAGNOSIS — H6983 Other specified disorders of Eustachian tube, bilateral: Secondary | ICD-10-CM

## 2018-09-19 MED ORDER — FLUTICASONE PROPIONATE 50 MCG/ACT NA SUSP: 2.0000 | Freq: Every day | NASAL | 0 refills | Status: DC

## 2018-09-19 MED ORDER — MECLIZINE HCL 25 MG PO TABS: 25.0000 mg | ORAL_TABLET | Freq: Three times a day (TID) | ORAL | 0 refills | Status: DC | PRN

## 2018-09-19 MED ORDER — MONTELUKAST SODIUM 10 MG PO TABS: 10.0000 mg | ORAL_TABLET | Freq: Every day | ORAL | 0 refills | Status: DC

## 2018-09-19 MED ORDER — LORATADINE 10 MG PO TABS: 10.0000 mg | ORAL_TABLET | Freq: Every day | ORAL | 0 refills | Status: DC

## 2018-09-19 NOTE — Progress Notes (Signed)
Patient ID: SHERAY GRIST DOB: 1956-06-07 AGE: 62 y.o. MRN: [id number]   PCP: Mar Daring, PA-C   Chief Complaint: Sneezing & itchy ears   Subjective:    HPI:  [name] is a 62 y.o. female presents for evaluation of a few days of sneezing. States occurred with the weather change; recent decrease in temperature. Patient this morning developed bilateral ear pruritis, right worse than left. Associated dull frontal headache; feels like sinus pressure/congestion. Associated dizziness. Intermittent. Triggered with turning head and bending over, such as when putting shoes on. Associated rhinorrhea. Has not taken any OTC medication for symptom relief. Patient denies fever, chills, severe headache, change in vision, presyncope/syncope, ear pain, ear pressure/fullness/popping/crackling, sore throat, cough, chest pain, palpitations, SOB, wheezing. Patient reports mild soft stool. Denies abdominal pain or nausea/vomiting.  Patient with previous history of vertigo secondary to bilateral serous effusion/sinusitis. Current symptoms feel similar.  A complete, at least 10 system review of symptoms was performed, pertinent positives and negatives as mentioned in HPI, otherwise negative.  The following portions of the patient's history were reviewed and updated as appropriate: allergies, current medications and past medical history.  Patient Active Problem List   Diagnosis Date Noted  . Status post total replacement of hip 04/04/2018  . Primary osteoarthritis of right hip 02/20/2018  . Allergic rhinitis 10/28/2015  . Acute stress disorder 08/01/2015  . Anxiety 08/01/2015  . Clinical depression 08/01/2015  . Diabetes ([place]) 08/01/2015  . Diverticulosis of colon 08/01/2015  . Generalized pruritus 08/01/2015  . BP (high blood pressure) 08/01/2015  . Cannot sleep 08/01/2015  . Adiposity 08/01/2015  . Detrusor muscle hypertonia 08/01/2015  . Hypercholesterolemia without  hypertriglyceridemia 08/01/2015  . Female stress incontinence 08/01/2015  . Avitaminosis D 08/01/2015    Allergies  Allergen Reactions  . Jardiance [Empagliflozin] Itching    Recurrent mycotic infections  . Penicillins Rash and Other (See Comments)    Has patient had a PCN reaction causing immediate rash, facial/tongue/throat swelling, SOB or lightheadedness with hypotension: No Has patient had a PCN reaction causing severe rash involving mucus membranes or skin necrosis: No Has patient had a PCN reaction that required hospitalization No Has patient had a PCN reaction occurring within the last 10 years: No If all of the above answers are "NO", then may proceed with Cephalosporin use.     Current Outpatient Medications on File Prior to Visit  Medication Sig Dispense Refill  . ACCU-CHEK GUIDE test strip CHECK BLOOD SUGAR 2 TO 3 TIMES DAILY 300 each 12  . ALPRAZolam (XANAX) 0.5 MG tablet TAKE 1/2 TO 1 TABLET BY MOUTH BEFORE WORK AND 1 TABLET BY MOUTH EVERY 8 HOURS AS NEEDED 30 tablet 5  . glipiZIDE (GLUCOTROL XL) 10 MG 24 hr tablet TAKE 1 TABLET BY MOUTH DAILY WITH BREAKFAST. 90 tablet 1  . hydrochlorothiazide (HYDRODIURIL) 25 MG tablet TAKE 1 TABLET (25 MG TOTAL) BY MOUTH DAILY. 90 tablet 1  . losartan (COZAAR) 25 MG tablet TAKE 1 TABLET BY MOUTH TWICE DAILY (Patient taking differently: TAKE 25 MG BY MOUTH TWICE DAILY) 180 tablet 1  . metFORMIN (GLUCOPHAGE) 1000 MG tablet TAKE 1 TABLET BY MOUTH TWICE A DAY WITH A MEAL. (Patient taking differently: TAKE 1000 MG BY MOUTH TWICE A DAY WITH A MEAL.) 180 tablet 1  . Multiple Vitamin (MULTIVITAMIN) tablet Take 1 tablet by mouth daily.    . sitaGLIPtin (JANUVIA) 100 MG tablet Take 1 tablet (100 mg total) by mouth daily. 90 tablet 1  No current facility-administered medications on file prior to visit.        Objective:    Vitals:   09/19/18 1412  BP: (!) 180/100  Pulse: 72  Temp: 98.1 F (36.7 C)  SpO2: 97%     Wt Readings from  Last 3 Encounters:  09/19/18 209 lb (94.8 kg)  06/13/18 204 lb 12.8 oz (92.9 kg)  04/14/18 201 lb 9.6 oz (91.4 kg)    Physical Exam:   General Appearance:  Alert, cooperative, no distress, appears stated age. Afebrile.  Head:  Normocephalic, without obvious abnormality, atraumatic  Eyes:  PERRL, conjunctiva/corneas clear, EOM's intact, fundi benign, both eyes. No nystagmus.  Ears:  Normal TM's and external ear canals, both ears. No erythema. No injection. No bulging. No visible serous effusion.  Nose: Nares normal, septum midline, mucosa normal, no drainage or sinus tenderness  Throat: Lips, mucosa, and tongue normal; teeth and gums normal  Neck: Supple, symmetrical, trachea midline, no adenopathy;  thyroid: not enlarged, symmetric, no tenderness/mass/nodules; no carotid bruit or JVD  Back:   Symmetric, no curvature, ROM normal, no CVA tenderness  Lungs:   Clear to auscultation bilaterally, respirations unlabored  Heart:  Regular rate and rhythm, S1 and S2 normal, no murmur, rub, or gallop  Abdomen:   Soft, non-tender, bowel sounds active all four quadrants,  no masses, no organomegaly  Extremities: Extremities normal, atraumatic, no cyanosis or edema  Pulses: 2+ and symmetric  Skin: Skin color, texture, turgor normal, no rashes or lesions  Lymph nodes: Cervical, supraclavicular, and axillary nodes normal  Neurologic: Normal. Finger to nose WNL. Heel to shin WNL. No pronator drift. RAMs WNL. Negative Romberg.   Dizziness elicited during physical examination with quickly turning head. No gait abnormality.  Assessment & Plan:    Exam findings, diagnosis etiology and medication use and indications reviewed with patient. Follow-Up and discharge instructions provided. No emergent/urgent issues found on exam.  Patient education was provided.   Patient verbalized understanding of information provided and agrees with plan of care (POC), all questions answered. The patient is advised to call  or return to clinic if condition does not see an improvement in symptoms, or to seek the care of the closest emergency department if condition worsens with the above plan.    1. Seasonal allergic rhinitis, unspecified trigger  - montelukast (SINGULAIR) 10 MG tablet; Take 1 tablet (10 mg total) by mouth at bedtime for 14 days.  Dispense: 14 tablet; Refill: 0 - loratadine (CLARITIN) 10 MG tablet; Take 1 tablet (10 mg total) by mouth daily for 14 days.  Dispense: 14 tablet; Refill: 0 - fluticasone (FLONASE) 50 MCG/ACT nasal spray; Place 2 sprays into both nostrils daily for 14 days.  Dispense: 16 g; Refill: 0  2. Eustachian tube dysfunction, bilateral  - meclizine (ANTIVERT) 25 MG tablet; Take 1 tablet (25 mg total) by mouth 3 (three) times daily as needed for dizziness.  Dispense: 30 tablet; Refill: 0  Patient with several days of sneezing. Then developed bilateral ear pruritis and dizziness. Suspect seasonal allergies/allergic rhinitis with eustachian tube dysfunction/BPPV.  Will treat with Claritin, Singulair, and Flonase. Given Meclizine for dizziness. Patient given off from work and discussed dizziness precautions.  Patient's BP elevated. Being monitored by PCP. On 07/14/2018 at post-operative appt for right hip replacement was 160/96. On 06/13/2018 at PCP's office [facility]) for cystitis was 144/100. Patient states she did not take her HCTZ today.  Advised f/u with PCP if symptoms not improving in  a few days.  Montey Hora, MHS, PA-C Advanced Practice Provider [facility]  [address], [place], [place], [place] [address] (p):  [phone] [email or web address]

## 2018-09-19 NOTE — Patient Instructions (Signed)
Thank you for choosing [facility] for your health care needs today.  You have been diagnosed with allergic rhinitis (seasonal allergies causing your runny noses and itchy ears). You also have vertigo (your dizziness).  Take prescription medication as prescribed.  1. Seasonal allergic rhinitis, unspecified trigger - montelukast (SINGULAIR) 10 MG tablet; Take 1 tablet (10 mg total) by mouth at bedtime for 14 days.  Dispense: 14 tablet; Refill: 0 - loratadine (CLARITIN) 10 MG tablet; Take 1 tablet (10 mg total) by mouth daily for 14 days.  Dispense: 14 tablet; Refill: 0 - fluticasone (FLONASE) 50 MCG/ACT nasal spray; Place 2 sprays into both nostrils daily for 14 days.  Dispense: 16 g; Refill: 0  2. Eustachian tube dysfunction, bilateral - meclizine (ANTIVERT) 25 MG tablet; Take 1 tablet (25 mg total) by mouth 3 (three) times daily as needed for dizziness.  Dispense: 30 tablet; Refill: 0  Be careful in regards to your dizziness. Change position slowly and carefully. Limit driving.  Follow-up with your family physician, urgent care, or [facility], if your symptoms do not improve within the next few days.  Allergic Rhinitis, Adult  Allergic rhinitis is an allergic reaction that affects the mucous membrane inside the nose. It causes sneezing, a runny or stuffy nose, and the feeling of mucus going down the back of the throat (postnasal drip). Allergic rhinitis can be mild to severe. There are two types of allergic rhinitis:  Seasonal. This type is also called hay fever. It happens only during certain seasons.  Perennial. This type can happen at any time of the year.  What are the causes? This condition happens when the body's defense system (immune system) responds to certain harmless substances called allergens as though they were germs.  Seasonal allergic rhinitis is triggered by pollen, which can come from grasses, trees, and weeds. Perennial allergic rhinitis may be caused by:  House dust  mites.  Pet dander.  Mold spores.  What are the signs or symptoms? Symptoms of this condition include:  Sneezing.  Runny or stuffy nose (nasal congestion).  Postnasal drip.  Itchy nose.  Tearing of the eyes.  Trouble sleeping.  Daytime sleepiness.  How is this diagnosed? This condition may be diagnosed based on:  Your medical history.  A physical exam.  Tests to check for related conditions, such as: ? Asthma. ? Pink eye. ? Ear infection. ? Upper respiratory infection.  Tests to find out which allergens trigger your symptoms. These may include skin or blood tests.  How is this treated? There is no cure for this condition, but treatment can help control symptoms. Treatment may include:  Taking medicines that block allergy symptoms, such as antihistamines. Medicine may be given as a shot, nasal spray, or pill.  Avoiding the allergen.  Desensitization. This treatment involves getting ongoing shots until your body becomes less sensitive to the allergen. This treatment may be done if other treatments do not help.  If taking medicine and avoiding the allergen does not work, new, stronger medicines may be prescribed.  Follow these instructions at home:  Find out what you are allergic to. Common allergens include smoke, dust, and pollen.  Avoid the things you are allergic to. These are some things you can do to help avoid allergens: ? Replace carpet with wood, tile, or vinyl flooring. Carpet can trap dander and dust. ? Do not smoke. Do not allow smoking in your home. ? Change your heating and air conditioning filter at least once a month. ? During  allergy season:  Keep windows closed as much as possible.  Plan outdoor activities when pollen counts are lowest. This is usually during the evening hours.  When coming indoors, change clothing and shower before sitting on furniture or bedding.  Take over-the-counter and prescription medicines only as told by your  health care provider.  Keep all follow-up visits as told by your health care provider. This is important. Contact a health care provider if:  You have a fever.  You develop a persistent cough.  You make whistling sounds when you breathe (you wheeze).  Your symptoms interfere with your normal daily activities. Get help right away if:  You have shortness of breath. Summary  This condition can be managed by taking medicines as directed and avoiding allergens.  Contact your health care provider if you develop a persistent cough or fever.  During allergy season, keep windows closed as much as possible. This information is not intended to replace advice given to you by your health care provider. Make sure you discuss any questions you have with your health care provider. Document Released: 08/25/2001 Document Revised: 01/07/2017 Document Reviewed: 01/07/2017 Elsevier Interactive Patient Education  [employer].

## 2018-09-22 ENCOUNTER — Telehealth: Payer: Self-pay | Admitting: Emergency Medicine

## 2018-09-22 NOTE — Telephone Encounter (Signed)
Spoke with patient following up on visit with [facility]. Per patient doing much better.

## 2018-10-17 ENCOUNTER — Other Ambulatory Visit: Payer: Self-pay | Admitting: Physician Assistant

## 2018-10-17 DIAGNOSIS — E1122 Type 2 diabetes mellitus with diabetic chronic kidney disease: Secondary | ICD-10-CM

## 2018-10-17 DIAGNOSIS — N182 Chronic kidney disease, stage 2 (mild): Secondary | ICD-10-CM

## 2018-10-17 DIAGNOSIS — IMO0002 Reserved for concepts with insufficient information to code with codable children: Secondary | ICD-10-CM

## 2018-10-17 DIAGNOSIS — E1142 Type 2 diabetes mellitus with diabetic polyneuropathy: Secondary | ICD-10-CM

## 2018-10-17 DIAGNOSIS — E1165 Type 2 diabetes mellitus with hyperglycemia: Principal | ICD-10-CM

## 2018-10-19 ENCOUNTER — Other Ambulatory Visit: Payer: Self-pay | Admitting: Physician Assistant

## 2018-10-19 DIAGNOSIS — I10 Essential (primary) hypertension: Secondary | ICD-10-CM

## 2018-10-21 ENCOUNTER — Encounter: Payer: Self-pay | Admitting: Physician Assistant

## 2018-10-21 ENCOUNTER — Ambulatory Visit: Payer: 59 | Admitting: Physician Assistant

## 2018-10-21 VITALS — BP 130/90 | HR 65 | Temp 98.9°F | Resp 16 | Wt 208.0 lb

## 2018-10-21 DIAGNOSIS — R300 Dysuria: Secondary | ICD-10-CM

## 2018-10-21 DIAGNOSIS — N3001 Acute cystitis with hematuria: Secondary | ICD-10-CM

## 2018-10-21 DIAGNOSIS — N182 Chronic kidney disease, stage 2 (mild): Secondary | ICD-10-CM | POA: Diagnosis not present

## 2018-10-21 DIAGNOSIS — E1122 Type 2 diabetes mellitus with diabetic chronic kidney disease: Secondary | ICD-10-CM

## 2018-10-21 LAB — POCT URINALYSIS DIPSTICK
Bilirubin, UA: NEGATIVE
Glucose, UA: POSITIVE — AB
Ketones, UA: NEGATIVE
Nitrite, UA: POSITIVE
Protein, UA: NEGATIVE
Spec Grav, UA: 1.025 (ref 1.010–1.025)
Urobilinogen, UA: 0.2 E.U./dL
pH, UA: 5 (ref 5.0–8.0)

## 2018-10-21 MED ORDER — SULFAMETHOXAZOLE-TRIMETHOPRIM 800-160 MG PO TABS: 1.0000 | ORAL_TABLET | Freq: Two times a day (BID) | ORAL | 0 refills | Status: DC

## 2018-10-21 NOTE — Patient Instructions (Signed)

## 2018-10-21 NOTE — Progress Notes (Signed)
Patient: [name] Female    DOB: [birth year]   62 y.o.   MRN: [id number] Visit Date: 10/21/2018  Today's Provider: Trinna Post, PA-C   Chief Complaint  Patient presents with  . Urinary Tract Infection   Subjective:    HPI Urinary Tract Infection: Patient complains of suprapubic pressure She has had symptoms for 2 days. Patient also complains of urgency. Patient denies fever. Patient does not have a history of recurrent UTI.  Patient does not have a history of pyelonephritis.      Allergies  Allergen Reactions  . Jardiance [Empagliflozin] Itching    Recurrent mycotic infections  . Penicillins Rash and Other (See Comments)    Has patient had a PCN reaction causing immediate rash, facial/tongue/throat swelling, SOB or lightheadedness with hypotension: No Has patient had a PCN reaction causing severe rash involving mucus membranes or skin necrosis: No Has patient had a PCN reaction that required hospitalization No Has patient had a PCN reaction occurring within the last 10 years: No If all of the above answers are "NO", then may proceed with Cephalosporin use.      Current Outpatient Medications:  .  ACCU-CHEK GUIDE test strip, CHECK BLOOD SUGAR 2 TO 3 TIMES DAILY, Disp: 300 each, Rfl: 12 .  ALPRAZolam (XANAX) 0.5 MG tablet, TAKE 1/2 TO 1 TABLET BY MOUTH BEFORE WORK AND 1 TABLET BY MOUTH EVERY 8 HOURS AS NEEDED, Disp: 30 tablet, Rfl: 5 .  glipiZIDE (GLUCOTROL XL) 10 MG 24 hr tablet, TAKE 1 TABLET BY MOUTH DAILY WITH BREAKFAST., Disp: 90 tablet, Rfl: 1 .  hydrochlorothiazide (HYDRODIURIL) 25 MG tablet, TAKE 1 TABLET (25 MG TOTAL) BY MOUTH DAILY., Disp: 90 tablet, Rfl: 1 .  JANUVIA 100 MG tablet, TAKE 1 TABLET BY MOUTH DAILY., Disp: 90 tablet, Rfl: 1 .  losartan (COZAAR) 25 MG tablet, TAKE 1 TABLET BY MOUTH TWICE DAILY, Disp: 180 tablet, Rfl: 1 .  meclizine (ANTIVERT) 25 MG tablet, Take 1 tablet (25 mg total) by mouth 3 (three) times daily as needed for dizziness.,  Disp: 30 tablet, Rfl: 0 .  metFORMIN (GLUCOPHAGE) 1000 MG tablet, TAKE 1 TABLET BY MOUTH TWICE A DAY WITH A MEAL., Disp: 180 tablet, Rfl: 1 .  Multiple Vitamin (MULTIVITAMIN) tablet, Take 1 tablet by mouth daily., Disp: , Rfl:  .  sulfamethoxazole-trimethoprim (BACTRIM DS,SEPTRA DS) 800-160 MG tablet, Take 1 tablet by mouth 2 (two) times daily., Disp: 6 tablet, Rfl: 0  Review of Systems  Constitutional: Negative.   Respiratory: Negative.   Genitourinary: Positive for difficulty urinating, pelvic pain and urgency.    Social History   Tobacco Use  . Smoking status: Former Smoker    Packs/day: 1.50    Years: 25.00    Pack years: 37.50    Types: Cigarettes    Last attempt to quit: 03/24/2007    Years since quitting: 11.5  . Smokeless tobacco: Never Used  Substance Use Topics  . Alcohol use: Yes    Alcohol/week: 0.0 standard drinks    Comment: 1- every 3-4 months   Objective:   BP 130/90 (BP Location: Left Arm, Patient Position: Sitting, Cuff Size: Large)   Pulse 65   Temp 98.9 F (37.2 C) (Oral)   Resp 16   Wt 208 lb (94.3 kg)   SpO2 99%   BMI 33.57 kg/m  Vitals:   10/21/18 0942  BP: 130/90  Pulse: 65  Resp: 16  Temp: 98.9 F (37.2 C)  TempSrc:  Oral  SpO2: 99%  Weight: 208 lb (94.3 kg)     Physical Exam  Constitutional: She is oriented to person, place, and time. She appears well-developed and well-nourished.  HENT:  Right Ear: External ear normal.  Left Ear: External ear normal.  Mouth/Throat: Oropharynx is clear and moist. No oropharyngeal exudate.  Eyes: Right eye exhibits discharge. Left eye exhibits discharge.  Neck: Neck supple.  Cardiovascular: Normal rate and regular rhythm.  Pulmonary/Chest: Effort normal and breath sounds normal. No respiratory distress. She has no rales.  Lymphadenopathy:    She has no cervical adenopathy.  Neurological: She is alert and oriented to person, place, and time.  Skin: Skin is warm and dry.  Psychiatric: She has a  normal mood and affect. Her behavior is normal.        Assessment & Plan:     1. Dysuria  - POCT urinalysis dipstick - Urine Culture - sulfamethoxazole-trimethoprim (BACTRIM DS,SEPTRA DS) 800-160 MG tablet; Take 1 tablet by mouth 2 (two) times daily.  Dispense: 6 tablet; Refill: 0  2. Acute cystitis with hematuria  - sulfamethoxazole-trimethoprim (BACTRIM DS,SEPTRA DS) 800-160 MG tablet; Take 1 tablet by mouth 2 (two) times daily.  Dispense: 6 tablet; Refill: 0  3. Type 2 diabetes mellitus with stage 2 chronic kidney disease, without long-term current use of insulin (HCC)  Uncontrolled diabetes, following with PCP.   Return if symptoms worsen or fail to improve.  The entirety of the information documented in the History of Present Illness, Review of Systems and Physical Exam were personally obtained by me. Portions of this information were initially documented by Lynford Humphrey, CMA and reviewed by me for thoroughness and accuracy.          Trinna Post, PA-C  [facility]

## 2018-10-23 LAB — URINE CULTURE

## 2018-10-24 ENCOUNTER — Telehealth: Payer: Self-pay

## 2018-10-24 DIAGNOSIS — N3090 Cystitis, unspecified without hematuria: Secondary | ICD-10-CM

## 2018-10-24 MED ORDER — CEPHALEXIN 500 MG PO CAPS: 500.0000 mg | ORAL_CAPSULE | Freq: Two times a day (BID) | ORAL | 0 refills | Status: AC

## 2018-10-24 NOTE — Telephone Encounter (Signed)
Patient would like to proceed with Keflex. Would you like me to send in?

## 2018-10-24 NOTE — Telephone Encounter (Signed)
-----   Message from Trinna Post, [place] sent at 10/24/2018  1:21 PM EST ----- Urine culture E. Coli bacteria that was resistant to bactrim prescribed. Allergy to PCN is rash, low severity. Recommend keflex 500 mg BID x 5 days. OK to send in?

## 2018-10-24 NOTE — Telephone Encounter (Signed)
Sent in. PCN allergy listed though patient has tolerated cefdinir previously and PCN allergy does not contraindicate cephalosporin.

## 2018-10-24 NOTE — Telephone Encounter (Signed)
Left message to call back  

## 2018-12-03 ENCOUNTER — Encounter: Payer: Self-pay | Admitting: Family Medicine

## 2018-12-03 ENCOUNTER — Ambulatory Visit: Payer: 59 | Admitting: Family Medicine

## 2018-12-03 VITALS — BP 142/86 | HR 83 | Temp 98.6°F | Resp 16 | Wt 205.8 lb

## 2018-12-03 DIAGNOSIS — H6692 Otitis media, unspecified, left ear: Secondary | ICD-10-CM | POA: Diagnosis not present

## 2018-12-03 DIAGNOSIS — R6889 Other general symptoms and signs: Secondary | ICD-10-CM

## 2018-12-03 LAB — POCT INFLUENZA A/B
Influenza A, POC: NEGATIVE
Influenza B, POC: NEGATIVE

## 2018-12-03 MED ORDER — AZITHROMYCIN 250 MG PO TABS: ORAL_TABLET | ORAL | 0 refills | Status: AC

## 2018-12-03 NOTE — Patient Instructions (Signed)
.   PLEASE BRING ALL OF YOUR MEDICATIONS TO EVERY APPOINTMENT TO MAKE SURE OUR MEDICATION LIST IS THE SAME AS YOURS   

## 2018-12-03 NOTE — Progress Notes (Signed)
Patient: [name] Female    DOB: 1956-09-11   62 y.o.   MRN: [id number] Visit Date: 12/03/2018  Today's Provider: Lelon Huh, MD   Chief Complaint  Patient presents with  . URI   Subjective:     URI   This is a new problem. The current episode started yesterday. The problem has been gradually worsening. The maximum temperature recorded prior to her arrival was 101 - 101.9 F. Associated symptoms include congestion, coughing, ear pain, headaches, a plugged ear sensation, sinus pain and a sore throat ("yesterday"). Pertinent negatives include no chest pain, rhinorrhea, sneezing, vomiting or wheezing. Associated symptoms comments: "body ache". She has tried increased fluids (Hot tea) for the symptoms. The treatment provided no relief.  States hurts all over, like bones hurt, face hurts.   Allergies  Allergen Reactions  . Jardiance [Empagliflozin] Itching    Recurrent mycotic infections  . Penicillins Rash and Other (See Comments)    Has patient had a PCN reaction causing immediate rash, facial/tongue/throat swelling, SOB or lightheadedness with hypotension: No Has patient had a PCN reaction causing severe rash involving mucus membranes or skin necrosis: No Has patient had a PCN reaction that required hospitalization No Has patient had a PCN reaction occurring within the last 10 years: No If all of the above answers are "NO", then may proceed with Cephalosporin use.      Current Outpatient Medications:  .  ACCU-CHEK GUIDE test strip, CHECK BLOOD SUGAR 2 TO 3 TIMES DAILY, Disp: 300 each, Rfl: 12 .  ALPRAZolam (XANAX) 0.5 MG tablet, TAKE 1/2 TO 1 TABLET BY MOUTH BEFORE WORK AND 1 TABLET BY MOUTH EVERY 8 HOURS AS NEEDED, Disp: 30 tablet, Rfl: 5 .  glipiZIDE (GLUCOTROL XL) 10 MG 24 hr tablet, TAKE 1 TABLET BY MOUTH DAILY WITH BREAKFAST., Disp: 90 tablet, Rfl: 1 .  hydrochlorothiazide (HYDRODIURIL) 25 MG tablet, TAKE 1 TABLET (25 MG TOTAL) BY MOUTH DAILY., Disp: 90  tablet, Rfl: 1 .  JANUVIA 100 MG tablet, TAKE 1 TABLET BY MOUTH DAILY., Disp: 90 tablet, Rfl: 1 .  losartan (COZAAR) 25 MG tablet, TAKE 1 TABLET BY MOUTH TWICE DAILY, Disp: 180 tablet, Rfl: 1 .  metFORMIN (GLUCOPHAGE) 1000 MG tablet, TAKE 1 TABLET BY MOUTH TWICE A DAY WITH A MEAL., Disp: 180 tablet, Rfl: 1 .  Multiple Vitamin (MULTIVITAMIN) tablet, Take 1 tablet by mouth daily., Disp: , Rfl:  .  meclizine (ANTIVERT) 25 MG tablet, Take 1 tablet (25 mg total) by mouth 3 (three) times daily as needed for dizziness. (Patient not taking: Reported on 12/03/2018), Disp: 30 tablet, Rfl: 0  Review of Systems  Constitutional: Positive for fever.  HENT: Positive for congestion, ear pain, postnasal drip, sinus pressure, sinus pain and sore throat ("yesterday"). Negative for rhinorrhea, sneezing and trouble swallowing.   Respiratory: Positive for cough. Negative for chest tightness, shortness of breath and wheezing.   Cardiovascular: Negative for chest pain, palpitations and leg swelling.  Gastrointestinal: Negative for vomiting.  Neurological: Positive for headaches.    Social History   Tobacco Use  . Smoking status: Former Smoker    Packs/day: 1.50    Years: 25.00    Pack years: 37.50    Types: Cigarettes    Last attempt to quit: 03/24/2007    Years since quitting: 11.7  . Smokeless tobacco: Never Used  Substance Use Topics  . Alcohol use: Yes    Alcohol/week: 0.0 standard drinks    Comment: 1-  every 3-4 months      Objective:   BP (!) 142/86 (BP Location: Right Arm, Patient Position: Sitting, Cuff Size: Large)   Pulse 83   Temp 98.6 F (37 C) (Oral)   Resp 16   Wt 205 lb 12.8 oz (93.4 kg)   SpO2 96%   BMI 33.22 kg/m  Vitals:   12/03/18 0935  BP: (!) 142/86  Pulse: 83  Resp: 16  Temp: 98.6 F (37 C)  TempSrc: Oral  SpO2: 96%  Weight: 205 lb 12.8 oz (93.4 kg)     Physical Exam  General Appearance:    Alert, cooperative, no distress  HENT:   left TM red, dull, bulging,  left TM fluid noted, neck without nodes,  sinus tender and nasal mucosa congested  Eyes:    PERRL, conjunctiva/corneas clear, EOM's intact       Lungs:     Clear to auscultation bilaterally, respirations unlabored  Heart:    Regular rate and rhythm  Neurologic:   Awake, alert, oriented x 3. No apparent focal neurological           defect.    Results for orders placed or performed in visit on 12/03/18  POCT Influenza A/B  Result Value Ref Range   Influenza A, POC Negative Negative   Influenza B, POC Negative Negative       Assessment & Plan    1. Flu-like symptoms  - POCT Influenza A/B  2. Left otitis media, unspecified otitis media type  - azithromycin (ZITHROMAX) 250 MG tablet; 2 by mouth today, then 1 daily for 4 days  Dispense: 6 tablet; Refill: 0  Work excuse today through 12-05-2018  Call if symptoms change or if not rapidly improving.      Lelon Huh, MD  [facility]

## 2018-12-05 ENCOUNTER — Telehealth: Payer: Self-pay | Admitting: Physician Assistant

## 2018-12-05 MED ORDER — FLUCONAZOLE 150 MG PO TABS: 150.0000 mg | ORAL_TABLET | Freq: Once | ORAL | 0 refills | Status: AC

## 2018-12-05 NOTE — Telephone Encounter (Signed)
Have sent prescription diflucan [facility] pharmacy.

## 2018-12-05 NOTE — Telephone Encounter (Signed)
Patient is requesting a medication for a yeast infection.  She is on antibiotics for ear infection.  She uses [facility]

## 2018-12-05 NOTE — Telephone Encounter (Signed)
Patient advised.

## 2019-01-27 ENCOUNTER — Other Ambulatory Visit: Payer: Self-pay | Admitting: Physician Assistant

## 2019-01-27 DIAGNOSIS — E1122 Type 2 diabetes mellitus with diabetic chronic kidney disease: Secondary | ICD-10-CM

## 2019-01-27 DIAGNOSIS — N182 Chronic kidney disease, stage 2 (mild): Secondary | ICD-10-CM

## 2019-01-27 DIAGNOSIS — I10 Essential (primary) hypertension: Secondary | ICD-10-CM

## 2019-02-24 ENCOUNTER — Telehealth: Payer: Self-pay | Admitting: Physician Assistant

## 2019-02-24 DIAGNOSIS — E1122 Type 2 diabetes mellitus with diabetic chronic kidney disease: Secondary | ICD-10-CM

## 2019-02-24 DIAGNOSIS — N182 Chronic kidney disease, stage 2 (mild): Secondary | ICD-10-CM

## 2019-02-24 DIAGNOSIS — F419 Anxiety disorder, unspecified: Secondary | ICD-10-CM

## 2019-02-24 MED ORDER — ACCU-CHEK GUIDE W/DEVICE KIT: 1.0000 | PACK | Freq: Two times a day (BID) | 0 refills | Status: DC

## 2019-02-24 MED ORDER — ALPRAZOLAM 0.5 MG PO TABS: ORAL_TABLET | ORAL | 5 refills | Status: DC

## 2019-02-24 MED ORDER — GLUCOSE BLOOD VI STRP: ORAL_STRIP | 12 refills | Status: DC

## 2019-02-24 MED ORDER — ACCU-CHEK SOFT TOUCH LANCETS MISC: 12 refills | Status: DC

## 2019-02-24 NOTE — Telephone Encounter (Signed)
refilled 

## 2019-02-24 NOTE — Telephone Encounter (Signed)
Pt called needing a new meter, lancets, strips complete set.  She said to use a generic   She also needs a refill on her Xanax .[place]  Thanks, [employer]

## 2019-03-06 ENCOUNTER — Telehealth: Payer: Self-pay

## 2019-03-06 NOTE — Telephone Encounter (Signed)
Patient called office stating that she believes that she has a UTI and is requesting that antibiotic be called into pharmacy. Patient complains of frequency and urgency for the past two days, she states that when she urinates she has little output. Patient denies hematuria or dysuria, patient states at this time she is unable to come in office do to her work schedule and would like Rx sent to [place], please advise per patient okay to leave a detailed message on her answering machine. KW

## 2019-03-06 NOTE — Telephone Encounter (Signed)
She needs to come in to have urine checked. I am here until 7p tonight

## 2019-03-08 ENCOUNTER — Ambulatory Visit: Payer: 59 | Admitting: Physician Assistant

## 2019-03-08 ENCOUNTER — Encounter: Payer: Self-pay | Admitting: Physician Assistant

## 2019-03-08 ENCOUNTER — Other Ambulatory Visit: Payer: Self-pay

## 2019-03-08 VITALS — BP 168/93 | HR 69 | Temp 98.2°F | Resp 16 | Wt 208.6 lb

## 2019-03-08 DIAGNOSIS — R300 Dysuria: Secondary | ICD-10-CM | POA: Diagnosis not present

## 2019-03-08 DIAGNOSIS — F418 Other specified anxiety disorders: Secondary | ICD-10-CM

## 2019-03-08 DIAGNOSIS — N3000 Acute cystitis without hematuria: Secondary | ICD-10-CM

## 2019-03-08 DIAGNOSIS — B379 Candidiasis, unspecified: Secondary | ICD-10-CM | POA: Diagnosis not present

## 2019-03-08 LAB — POCT URINALYSIS DIPSTICK
Bilirubin, UA: NEGATIVE
Glucose, UA: POSITIVE — AB
Ketones, UA: NEGATIVE
Nitrite, UA: POSITIVE
Protein, UA: NEGATIVE
Spec Grav, UA: 1.015 (ref 1.010–1.025)
Urobilinogen, UA: 0.2 E.U./dL
pH, UA: 5 (ref 5.0–8.0)

## 2019-03-08 MED ORDER — FLUCONAZOLE 150 MG PO TABS: 150.0000 mg | ORAL_TABLET | Freq: Once | ORAL | 0 refills | Status: AC

## 2019-03-08 MED ORDER — SULFAMETHOXAZOLE-TRIMETHOPRIM 800-160 MG PO TABS: 1.0000 | ORAL_TABLET | Freq: Two times a day (BID) | ORAL | 0 refills | Status: DC

## 2019-03-08 MED ORDER — SERTRALINE HCL 100 MG PO TABS: 100.0000 mg | ORAL_TABLET | Freq: Every day | ORAL | 1 refills | Status: DC

## 2019-03-08 NOTE — Progress Notes (Signed)
Patient: [name] Female    DOB: 06-23-1956   63 y.o.   MRN: [id number] Visit Date: 03/08/2019  Today's Provider: Mar Daring, PA-C   Chief Complaint  Patient presents with  . Dysuria   Subjective:     Dysuria   This is a new problem. The current episode started in the past 7 days (2 days ago). The problem occurs intermittently. The problem has been gradually improving. The quality of the pain is described as aching (at the end of the stream). There has been no fever. She is not sexually active. There is no history of pyelonephritis. Associated symptoms include chills, flank pain (left side off and on 2 days ago) and frequency. Pertinent negatives include no discharge, hematuria, hesitancy or urgency. She has tried increased fluids for the symptoms. The treatment provided no relief.   Patient also complains of vaginal itching. Does have history of yeast infections from [place].   Also reports increased stress. 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 Wanting to restart sertraline.   Allergies  Allergen Reactions  . Jardiance [Empagliflozin] Itching    Recurrent mycotic infections  . Penicillins Rash and Other (See Comments)    Has patient had a PCN reaction causing immediate rash, facial/tongue/throat swelling, SOB or lightheadedness with hypotension: No Has patient had a PCN reaction causing severe rash involving mucus membranes or skin necrosis: No Has patient had a PCN reaction that required hospitalization No Has patient had a PCN reaction occurring within the last 10 years: No If all of the above answers are "NO", then may proceed with Cephalosporin use.      Current Outpatient Medications:  .  ALPRAZolam (XANAX) 0.5 MG tablet, TAKE 1/2 TO 1 TABLET BY MOUTH BEFORE WORK AND 1 TABLET BY MOUTH EVERY 8 HOURS AS NEEDED, Disp: 30 tablet, Rfl: 5 .  Blood Glucose Monitoring Suppl (ACCU-CHEK GUIDE) w/Device KIT, 1 kit  by Does not apply route 2 (two) times daily. To check blood sugar twice daily, Disp: 1 kit, Rfl: 0 .  glipiZIDE (GLUCOTROL XL) 10 MG 24 hr tablet, TAKE 1 TABLET BY MOUTH DAILY WITH BREAKFAST., Disp: 90 tablet, Rfl: 1 .  glucose blood (ACCU-CHEK GUIDE) test strip, To check blood sugar twice daily, Disp: 100 each, Rfl: 12 .  hydrochlorothiazide (HYDRODIURIL) 25 MG tablet, TAKE 1 TABLET BY MOUTH DAILY., Disp: 90 tablet, Rfl: 1 .  JANUVIA 100 MG tablet, TAKE 1 TABLET BY MOUTH DAILY., Disp: 90 tablet, Rfl: 1 .  Lancets (ACCU-CHEK SOFT TOUCH) lancets, To check blood sugar twice daily, Disp: 100 each, Rfl: 12 .  losartan (COZAAR) 25 MG tablet, TAKE 1 TABLET BY MOUTH TWICE DAILY, Disp: 180 tablet, Rfl: 1 .  metFORMIN (GLUCOPHAGE) 1000 MG tablet, TAKE 1 TABLET BY MOUTH TWICE A DAY WITH A MEAL., Disp: 180 tablet, Rfl: 1 .  meclizine (ANTIVERT) 25 MG tablet, Take 1 tablet (25 mg total) by mouth 3 (three) times daily as needed for dizziness. (Patient not taking: Reported on 12/03/2018), Disp: 30 tablet, Rfl: 0 .  Multiple Vitamin (MULTIVITAMIN) tablet, Take 1 tablet by mouth daily., Disp: , Rfl:   Review of Systems  Constitutional: Positive for chills.  Respiratory: Negative.   Cardiovascular: Negative.   Gastrointestinal: Negative.   Genitourinary: Positive for dysuria, flank pain (left side off and on 2 days ago) and frequency. Negative for hematuria, hesitancy and urgency.       Vaginal itching  Psychiatric/Behavioral: The patient is nervous/anxious.     Social History   Tobacco Use  . Smoking status: Former Smoker    Packs/day: 1.50    Years: 25.00    Pack years: 37.50    Types: Cigarettes    Last attempt to quit: 03/24/2007    Years since quitting: 11.9  . Smokeless tobacco: Never Used  Substance Use Topics  . Alcohol use: Yes    Alcohol/week: 0.0 standard drinks    Comment: 1- every 3-4 months      Objective:   BP (!) 168/93 (BP Location: Left Arm, Patient Position: Sitting, Cuff  Size: Large)   Pulse 69   Temp 98.2 F (36.8 C) (Oral)   Resp 16   Wt 208 lb 9.6 oz (94.6 kg)   BMI 33.67 kg/m  Vitals:   03/08/19 0943  BP: (!) 168/93  Pulse: 69  Resp: 16  Temp: 98.2 F (36.8 C)  TempSrc: Oral  Weight: 208 lb 9.6 oz (94.6 kg)     Physical Exam Vitals signs reviewed.  Constitutional:      General: She is not in acute distress.    Appearance: Normal appearance. She is well-developed. She is obese. She is not diaphoretic.  Cardiovascular:     Rate and Rhythm: Normal rate and regular rhythm.     Heart sounds: Normal heart sounds. No murmur. No friction rub. No gallop.   Pulmonary:     Effort: Pulmonary effort is normal. No respiratory distress.     Breath sounds: Normal breath sounds. No wheezing or rales.  Abdominal:     General: Bowel sounds are normal. There is no distension.     Palpations: Abdomen is soft. There is no mass.     Tenderness: There is no abdominal tenderness. There is no guarding or rebound.  Skin:    General: Skin is warm and dry.  Neurological:     Mental Status: She is alert and oriented to person, place, and time.         Assessment & Plan    1. Acute cystitis without hematuria Worsening symptoms. UA positive. Will treat empirically with Bactrim as below. Continue to push fluids. Urine sent for culture. Will follow up pending C&S results. She is to call if symptoms do not improve or if they worsen.  - sulfamethoxazole-trimethoprim (BACTRIM DS,SEPTRA DS) 800-160 MG tablet; Take 1 tablet by mouth 2 (two) times daily.  Dispense: 14 tablet; Refill: 0  2. Dysuria See above medical treatment plan. - POCT urinalysis dipstick - Urine Culture  3. Situational anxiety Restart sertraline as below. Call if worsening or if not as effective.  - sertraline (ZOLOFT) 100 MG tablet; Take 1 tablet (100 mg total) by mouth daily. Start with 1/2 tab (4m) x 1 week, then increase to 1 tab  Dispense: 90 tablet; Refill: 1  4. Yeast infection  Gets yeast infections from [place] Diflucan given as below. - fluconazole (DIFLUCAN) 150 MG tablet; Take 1 tablet (150 mg total) by mouth once for 1 dose. May repeat in 48-72 hrs if needed  Dispense: 2 tablet; Refill: 0     JMar Daring PA-C  [facility]

## 2019-03-10 ENCOUNTER — Telehealth: Payer: Self-pay

## 2019-03-10 LAB — URINE CULTURE

## 2019-03-10 NOTE — Telephone Encounter (Signed)
Na, lmtcb

## 2019-03-10 NOTE — Telephone Encounter (Signed)
Patient was advised.  

## 2019-03-10 NOTE — Telephone Encounter (Signed)
-----   Message from Mar Daring, [place] sent at 03/10/2019 12:06 PM EDT ----- Urine culture was positive for klebsiella. It is sensitive to bactrim as you were placed on. Continue until completed.

## 2019-04-06 DIAGNOSIS — Z96641 Presence of right artificial hip joint: Secondary | ICD-10-CM | POA: Diagnosis not present

## 2019-04-06 DIAGNOSIS — M1612 Unilateral primary osteoarthritis, left hip: Secondary | ICD-10-CM | POA: Diagnosis not present

## 2019-04-13 ENCOUNTER — Telehealth: Payer: Self-pay

## 2019-04-13 DIAGNOSIS — B379 Candidiasis, unspecified: Secondary | ICD-10-CM

## 2019-04-13 DIAGNOSIS — N3000 Acute cystitis without hematuria: Secondary | ICD-10-CM

## 2019-04-13 NOTE — Telephone Encounter (Signed)
Patient called and states she has recurring symptoms of her yeast infection from visit 03/08/2019. Patient states that she only took 7 days worth of her Bactrim due to grandchild knocking the rest of the medication over. Patient would like for more bactrim to be send in if possible. Patient is aware that Tawanna Sat will not be back in office until tomorrow.Please advise.

## 2019-04-14 MED ORDER — SULFAMETHOXAZOLE-TRIMETHOPRIM 800-160 MG PO TABS: 1.0000 | ORAL_TABLET | Freq: Two times a day (BID) | ORAL | 0 refills | Status: DC

## 2019-04-14 MED ORDER — FLUCONAZOLE 150 MG PO TABS: 150.0000 mg | ORAL_TABLET | Freq: Once | ORAL | 0 refills | Status: AC

## 2019-04-14 NOTE — Telephone Encounter (Signed)
Is she also having UTI symptoms also? If only yeast symptoms I will send in Diflucan, she will not need Bactrim. If she is having UTI symptoms I can send both.

## 2019-04-14 NOTE — Telephone Encounter (Signed)
Patient states she is having flank pain like last time along with itching. [facility].

## 2019-04-14 NOTE — Addendum Note (Signed)
Addended by: Mar Daring on: 04/14/2019 09:19 AM   Modules accepted: Orders

## 2019-05-18 ENCOUNTER — Other Ambulatory Visit: Payer: Self-pay | Admitting: Physician Assistant

## 2019-05-18 DIAGNOSIS — E1122 Type 2 diabetes mellitus with diabetic chronic kidney disease: Secondary | ICD-10-CM

## 2019-05-18 DIAGNOSIS — N182 Chronic kidney disease, stage 2 (mild): Secondary | ICD-10-CM

## 2019-05-18 DIAGNOSIS — E1142 Type 2 diabetes mellitus with diabetic polyneuropathy: Secondary | ICD-10-CM

## 2019-05-18 DIAGNOSIS — I10 Essential (primary) hypertension: Secondary | ICD-10-CM

## 2019-05-18 DIAGNOSIS — IMO0002 Reserved for concepts with insufficient information to code with codable children: Secondary | ICD-10-CM

## 2019-07-14 NOTE — Progress Notes (Signed)
Patient: [name] Female    DOB: 08-Mar-1956   63 y.o.   MRN: [id number] Visit Date: 07/17/2019  Today's Provider: Mar Daring, PA-C   Chief Complaint  Patient presents with  . Pruritis   Subjective:     HPI   Patient states she has itching on her back and her arms. No rash. Patient states she has had these symptoms before several years ago. Patient has had itching this time for 2 weeks. She feels it is secondary to her anxiety. Has been very stressed at work.    Allergies  Allergen Reactions  . Jardiance [Empagliflozin] Itching    Recurrent mycotic infections  . Penicillins Rash and Other (See Comments)    Has patient had a PCN reaction causing immediate rash, facial/tongue/throat swelling, SOB or lightheadedness with hypotension: No Has patient had a PCN reaction causing severe rash involving mucus membranes or skin necrosis: No Has patient had a PCN reaction that required hospitalization No Has patient had a PCN reaction occurring within the last 10 years: No If all of the above answers are "NO", then may proceed with Cephalosporin use.      Current Outpatient Medications:  .  ALPRAZolam (XANAX) 0.5 MG tablet, TAKE 1/2 TO 1 TABLET BY MOUTH BEFORE WORK AND 1 TABLET BY MOUTH EVERY 8 HOURS AS NEEDED, Disp: 30 tablet, Rfl: 5 .  Blood Glucose Monitoring Suppl (ACCU-CHEK GUIDE) w/Device KIT, 1 kit by Does not apply route 2 (two) times daily. To check blood sugar twice daily, Disp: 1 kit, Rfl: 0 .  glipiZIDE (GLUCOTROL XL) 10 MG 24 hr tablet, TAKE 1 TABLET BY MOUTH DAILY WITH BREAKFAST., Disp: 90 tablet, Rfl: 1 .  glucose blood (ACCU-CHEK GUIDE) test strip, To check blood sugar twice daily, Disp: 100 each, Rfl: 12 .  hydrochlorothiazide (HYDRODIURIL) 25 MG tablet, TAKE 1 TABLET BY MOUTH DAILY., Disp: 90 tablet, Rfl: 1 .  JANUVIA 100 MG tablet, TAKE 1 TABLET BY MOUTH DAILY., Disp: 90 tablet, Rfl: 1 .  Lancets (ACCU-CHEK SOFT TOUCH) lancets, To check blood sugar  twice daily, Disp: 100 each, Rfl: 12 .  losartan (COZAAR) 25 MG tablet, TAKE 1 TABLET BY MOUTH TWICE DAILY, Disp: 180 tablet, Rfl: 1 .  metFORMIN (GLUCOPHAGE) 1000 MG tablet, TAKE 1 TABLET BY MOUTH TWICE A DAY WITH A MEAL., Disp: 180 tablet, Rfl: 1 .  Multiple Vitamin (MULTIVITAMIN) tablet, Take 1 tablet by mouth daily., Disp: , Rfl:  .  sertraline (ZOLOFT) 100 MG tablet, Take 1 tablet (100 mg total) by mouth daily. Start with 1/2 tab (73m) x 1 week, then increase to 1 tab, Disp: 90 tablet, Rfl: 1 .  meclizine (ANTIVERT) 25 MG tablet, Take 1 tablet (25 mg total) by mouth 3 (three) times daily as needed for dizziness. (Patient not taking: Reported on 12/03/2018), Disp: 30 tablet, Rfl: 0 .  sulfamethoxazole-trimethoprim (BACTRIM DS) 800-160 MG tablet, Take 1 tablet by mouth 2 (two) times daily. (Patient not taking: Reported on 07/17/2019), Disp: 14 tablet, Rfl: 0  Review of Systems  Constitutional: Negative for appetite change, chills, fatigue and fever.  Respiratory: Negative for chest tightness and shortness of breath.   Cardiovascular: Negative for chest pain and palpitations.  Gastrointestinal: Negative for abdominal pain, nausea and vomiting.  Neurological: Negative for dizziness and weakness.    Social History   Tobacco Use  . Smoking status: Former Smoker    Packs/day: 1.50    Years: 25.00    Pack  years: 37.50    Types: Cigarettes    Quit date: 03/24/2007    Years since quitting: 12.3  . Smokeless tobacco: Never Used  Substance Use Topics  . Alcohol use: Yes    Alcohol/week: 0.0 standard drinks    Comment: 1- every 3-4 months      Objective:   BP 134/87 (BP Location: Left Arm, Patient Position: Sitting, Cuff Size: Large)   Pulse 66   Temp 98.1 F (36.7 C) (Oral)   Resp 16   Ht 5' 6"  (1.676 m)   Wt 199 lb (90.3 kg)   SpO2 95%   BMI 32.12 kg/m  Vitals:   07/17/19 0826  BP: 134/87  Pulse: 66  Resp: 16  Temp: 98.1 F (36.7 C)  TempSrc: Oral  SpO2: 95%  Weight: 199  lb (90.3 kg)  Height: 5' 6"  (1.676 m)     Physical Exam Vitals signs reviewed.  Constitutional:      General: She is not in acute distress.    Appearance: Normal appearance. She is well-developed. She is not ill-appearing or diaphoretic.  Neck:     Musculoskeletal: Normal range of motion and neck supple.     Thyroid: No thyromegaly.     Vascular: No JVD.     Trachea: No tracheal deviation.  Cardiovascular:     Rate and Rhythm: Normal rate and regular rhythm.     Heart sounds: Normal heart sounds. No murmur. No friction rub. No gallop.   Pulmonary:     Effort: Pulmonary effort is normal. No respiratory distress.     Breath sounds: Normal breath sounds. No wheezing or rales.  Lymphadenopathy:     Cervical: No cervical adenopathy.  Skin:    General: Skin is warm and dry.     Findings: No rash.  Neurological:     Mental Status: She is alert.      No results found for any visits on 07/17/19.     Assessment & Plan    1. Uncontrolled type 2 diabetes mellitus with diabetic polyneuropathy, without long-term current use of insulin (HCC) Stable. Diagnosis pulled for medication refill. Continue current medical treatment plan. - sitaGLIPtin (JANUVIA) 100 MG tablet; Take 1 tablet (100 mg total) by mouth daily.  Dispense: 90 tablet; Refill: 1  2. Itching Will try hydroxyzine to stop itch-scratch cycle. Call if not improving.  - hydrOXYzine (ATARAX/VISTARIL) 10 MG tablet; Take 1 tablet (10 mg total) by mouth 3 (three) times daily as needed.  Dispense: 30 tablet; Refill: 0  3. Acute anxiety Stable. Diagnosis pulled for medication refill. Continue current medical treatment plan. - ALPRAZolam (XANAX) 0.5 MG tablet; TAKE 1/2 TO 1 TABLET BY MOUTH BEFORE WORK AND 1 TABLET BY MOUTH EVERY 8 HOURS AS NEEDED  Dispense: 30 tablet; Refill: [place], PA-C  [facility]

## 2019-07-17 ENCOUNTER — Ambulatory Visit: Payer: 59 | Admitting: Physician Assistant

## 2019-07-17 ENCOUNTER — Other Ambulatory Visit: Payer: Self-pay

## 2019-07-17 ENCOUNTER — Encounter: Payer: Self-pay | Admitting: Physician Assistant

## 2019-07-17 VITALS — BP 134/87 | HR 66 | Temp 98.1°F | Resp 16 | Ht 66.0 in | Wt 199.0 lb

## 2019-07-17 DIAGNOSIS — L299 Pruritus, unspecified: Secondary | ICD-10-CM

## 2019-07-17 DIAGNOSIS — F419 Anxiety disorder, unspecified: Secondary | ICD-10-CM

## 2019-07-17 DIAGNOSIS — E1142 Type 2 diabetes mellitus with diabetic polyneuropathy: Secondary | ICD-10-CM | POA: Diagnosis not present

## 2019-07-17 DIAGNOSIS — E1165 Type 2 diabetes mellitus with hyperglycemia: Secondary | ICD-10-CM

## 2019-07-17 DIAGNOSIS — IMO0002 Reserved for concepts with insufficient information to code with codable children: Secondary | ICD-10-CM

## 2019-07-17 MED ORDER — SITAGLIPTIN PHOSPHATE 100 MG PO TABS: 100.0000 mg | ORAL_TABLET | Freq: Every day | ORAL | 1 refills | Status: DC

## 2019-07-17 MED ORDER — ALPRAZOLAM 0.5 MG PO TABS: ORAL_TABLET | ORAL | 5 refills | Status: DC

## 2019-07-17 MED ORDER — HYDROXYZINE HCL 10 MG PO TABS: 10.0000 mg | ORAL_TABLET | Freq: Three times a day (TID) | ORAL | 0 refills | Status: DC | PRN

## 2019-07-17 NOTE — Patient Instructions (Signed)
Pruritus Pruritus is an itchy feeling on the skin. One of the most common causes is dry skin, but many different things can cause itching. Most cases of itching do not require medical attention. Sometimes itchy skin can turn into a rash. Follow these instructions at home: Skin care   Apply moisturizing lotion to your skin as needed. Lotion that contains petroleum jelly is best.  Take medicines or apply medicated creams only as told by your health care provider. This may include: ? Corticosteroid cream. ? Anti-itch lotions. ? Oral antihistamines.  Apply a cool, wet cloth (cool compress) to the affected areas.  Take baths with one of the following: ? Epsom salts. You can get these at your local pharmacy or grocery store. Follow the instructions on the packaging. ? Baking soda. Pour a small amount into the bath as told by your health care provider. ? Colloidal oatmeal. You can get this at your local pharmacy or grocery store. Follow the instructions on the packaging.  Apply baking soda paste to your skin. To make the paste, stir water into a small amount of baking soda until it reaches a paste-like consistency.  Do not scratch your skin.  Do not take hot showers or baths, which can make itching worse. A cool shower may help with itching as long as you apply moisturizing lotion after the shower.  Do not use scented soaps, detergents, perfumes, and cosmetic products. Instead, use gentle, unscented versions of these items. General instructions  Avoid wearing tight clothes.  Keep a journal to help find out what is causing your itching. Write down: ? What you eat and drink. ? What cosmetic products you use. ? What soaps or detergents you use. ? What you wear, including jewelry.  Use a humidifier. This keeps the air moist, which helps to prevent dry skin.  Be aware of any changes in your itchiness. Contact a health care provider if:  The itching does not go away after several days.   You are unusually thirsty or urinating more than normal.  Your skin tingles or feels numb.  Your skin or the white parts of your eyes turn yellow (jaundice).  You feel weak.  You have any of the following: ? Night sweats. ? Tiredness (fatigue). ? Weight loss. ? Abdominal pain. Summary  Pruritus is an itchy feeling on the skin. One of the most common causes is dry skin, but many different conditions and factors can cause itching.  Apply moisturizing lotion to your skin as needed. Lotion that contains petroleum jelly is best.  Take medicines or apply medicated creams only as told by your health care provider.  Do not take hot showers or baths. Do not use scented soaps, detergents, perfumes, or cosmetic products. This information is not intended to replace advice given to you by your health care provider. Make sure you discuss any questions you have with your health care provider. Document Released: 08/12/2011 Document Revised: 12/14/2017 Document Reviewed: 12/14/2017 Elsevier Patient Education  2020 [employer].

## 2019-08-14 ENCOUNTER — Other Ambulatory Visit: Payer: Self-pay | Admitting: Physician Assistant

## 2019-08-14 DIAGNOSIS — Z1231 Encounter for screening mammogram for malignant neoplasm of breast: Secondary | ICD-10-CM

## 2019-08-23 NOTE — Progress Notes (Addendum)
Patient: [name], Female    DOB: 1956-10-25, 63 y.o.   MRN: [id number] Visit Date: 08/24/2019  Today's Provider: Mar Daring, PA-C   Chief Complaint  Patient presents with  . Annual Exam   Subjective:     Annual physical exam [name] is a 63 y.o. female who presents today for health maintenance and complete physical. She feels well. She reports exercising, walking . She reports she is sleeping fairly well. ----------------------------------------------------------------- Mammogram scheduled 09/26/2019 Pap:11/12/2016-Normal, HPV-Neg-Repeat 3-5 yrs   Review of Systems  Constitutional: Negative.   HENT: Negative.   Eyes: Negative.   Respiratory: Positive for chest tightness.   Cardiovascular: Negative.   Gastrointestinal: Negative.   Endocrine: Negative.   Genitourinary: Negative.   Musculoskeletal: Positive for arthralgias.  Skin: Negative.        Itching  Allergic/Immunologic: Negative.   Neurological: Negative.   Hematological: Negative.   Psychiatric/Behavioral: Positive for dysphoric mood.    Social History      She  reports that she quit smoking about 12 years ago. Her smoking use included cigarettes. She has a 37.50 pack-year smoking history. She has never used smokeless tobacco. She reports current alcohol use. She reports that she does not use drugs.       Social History   Socioeconomic History  . Marital status: Widowed    Spouse name: Not on file  . Number of children: Not on file  . Years of education: Not on file  . Highest education level: Not on file  Occupational History    Employer: [employer]  . Financial resource strain: Not on file  . Food insecurity    Worry: Not on file    Inability: Not on file  . Transportation needs    Medical: Not on file    Non-medical: Not on file  Tobacco Use  . Smoking status: Former Smoker    Packs/day: 1.50    Years: 25.00    Pack years: 37.50    Types:  Cigarettes    Quit date: 03/24/2007    Years since quitting: 12.4  . Smokeless tobacco: Never Used  Substance and Sexual Activity  . Alcohol use: Yes    Alcohol/week: 0.0 standard drinks    Comment: 1- every 3-4 months  . Drug use: No  . Sexual activity: Not on file  Lifestyle  . Physical activity    Days per week: Not on file    Minutes per session: Not on file  . Stress: Not on file  Relationships  . Social Herbalist on phone: Not on file    Gets together: Not on file    Attends religious service: Not on file    Active member of club or organization: Not on file    Attends meetings of clubs or organizations: Not on file    Relationship status: Not on file  Other Topics Concern  . Not on file  Social History Narrative  . Not on file    Past Medical History:  Diagnosis Date  . Anxiety   . Arthritis   . Cataract    left  . Depression   . Diabetes mellitus without complication ([place])   . Environmental and seasonal allergies   . GERD (gastroesophageal reflux disease)   . Hyperlipidemia   . Hypertension   . Joint pain    as reported by patient  . Urinary incontinence    as  stated by patient     Patient Active Problem List   Diagnosis Date Noted  . Status post total replacement of hip 04/04/2018  . Primary osteoarthritis of right hip 02/20/2018  . Allergic rhinitis 10/28/2015  . Acute stress disorder 08/01/2015  . Anxiety 08/01/2015  . Clinical depression 08/01/2015  . Diabetes ([place]) 08/01/2015  . Diverticulosis of colon 08/01/2015  . Generalized pruritus 08/01/2015  . BP (high blood pressure) 08/01/2015  . Cannot sleep 08/01/2015  . Adiposity 08/01/2015  . Detrusor muscle hypertonia 08/01/2015  . Hypercholesterolemia without hypertriglyceridemia 08/01/2015  . Female stress incontinence 08/01/2015  . Avitaminosis D 08/01/2015    Past Surgical History:  Procedure Laterality Date  . BREAST EXCISIONAL BIOPSY Right 1999  . BREAST LUMPECTOMY Right  2005   as reported by patient  . CATARACT EXTRACTION  January 2013  . CATARACT EXTRACTION W/PHACO Left 12/02/2016   Procedure: CATARACT EXTRACTION PHACO AND INTRAOCULAR LENS PLACEMENT (IOC);  Surgeon: Estill Cotta, MD;  Location: [facility] ORS;  Service: Ophthalmology;  Laterality: Left;  [place] 2:14 AP% 28.4 CDE 59.73 Fluid pack lot # [id number]  . DIAGNOSTIC LAPAROSCOPY    . TOTAL HIP ARTHROPLASTY Right 04/04/2018   Procedure: TOTAL HIP ARTHROPLASTY;  Surgeon: Dereck Leep, MD;  Location: [facility] ORS;  Service: Orthopedics;  Laterality: Right;    Family History        Family Status  Relation Name Status  . Brother  Alive  . Mother  Deceased  . Father  Deceased  . PGM  Deceased  . PGF  Deceased  . MGM  Deceased  . MGF  Deceased  . Daughter  Alive  . Son  Alive        Her family history includes Alzheimer's disease in her paternal grandmother; Aneurysm in her mother; CAD in her father; Cancer in her father; Colon cancer in her father; Dementia in her paternal grandmother; Diabetes in her father; Healthy in her brother, daughter, and son; Heart failure in her father; Mental illness in her paternal grandfather.      Allergies  Allergen Reactions  . Jardiance [Empagliflozin] Itching    Recurrent mycotic infections  . Penicillins Rash and Other (See Comments)    Has patient had a PCN reaction causing immediate rash, facial/tongue/throat swelling, SOB or lightheadedness with hypotension: No Has patient had a PCN reaction causing severe rash involving mucus membranes or skin necrosis: No Has patient had a PCN reaction that required hospitalization No Has patient had a PCN reaction occurring within the last 10 years: No If all of the above answers are "NO", then may proceed with Cephalosporin use.      Current Outpatient Medications:  .  ALPRAZolam (XANAX) 0.5 MG tablet, TAKE 1/2 TO 1 TABLET BY MOUTH BEFORE WORK AND 1 TABLET BY MOUTH EVERY 8 HOURS AS NEEDED, Disp: 30 tablet, Rfl: 5 .   Blood Glucose Monitoring Suppl (ACCU-CHEK GUIDE) w/Device KIT, 1 kit by Does not apply route 2 (two) times daily. To check blood sugar twice daily, Disp: 1 kit, Rfl: 0 .  glipiZIDE (GLUCOTROL XL) 10 MG 24 hr tablet, TAKE 1 TABLET BY MOUTH DAILY WITH BREAKFAST., Disp: 90 tablet, Rfl: 1 .  glucose blood (ACCU-CHEK GUIDE) test strip, To check blood sugar twice daily, Disp: 100 each, Rfl: 12 .  hydrochlorothiazide (HYDRODIURIL) 25 MG tablet, TAKE 1 TABLET BY MOUTH DAILY., Disp: 90 tablet, Rfl: 1 .  hydrOXYzine (ATARAX/VISTARIL) 10 MG tablet, Take 1 tablet (10 mg total) by mouth 3 (three)  times daily as needed., Disp: 30 tablet, Rfl: 0 .  Lancets (ACCU-CHEK SOFT TOUCH) lancets, To check blood sugar twice daily, Disp: 100 each, Rfl: 12 .  losartan (COZAAR) 25 MG tablet, TAKE 1 TABLET BY MOUTH TWICE DAILY, Disp: 180 tablet, Rfl: 1 .  metFORMIN (GLUCOPHAGE) 1000 MG tablet, TAKE 1 TABLET BY MOUTH TWICE A DAY WITH A MEAL., Disp: 180 tablet, Rfl: 1 .  Multiple Vitamin (MULTIVITAMIN) tablet, Take 1 tablet by mouth daily., Disp: , Rfl:  .  sertraline (ZOLOFT) 100 MG tablet, Take 1 tablet (100 mg total) by mouth daily. Start with 1/2 tab (17m) x 1 week, then increase to 1 tab, Disp: 90 tablet, Rfl: 1 .  meclizine (ANTIVERT) 25 MG tablet, Take 1 tablet (25 mg total) by mouth 3 (three) times daily as needed for dizziness. (Patient not taking: Reported on 12/03/2018), Disp: 30 tablet, Rfl: 0 .  sitaGLIPtin (JANUVIA) 100 MG tablet, Take 1 tablet (100 mg total) by mouth daily. (Patient not taking: Reported on 08/24/2019), Disp: 90 tablet, Rfl: 1   Patient Care Team: BRubye Beachas PCP - General (Physician Assistant)    Objective:    Vitals: BP 127/72 (BP Location: Left Arm, Patient Position: Sitting, Cuff Size: Large)   Pulse 83   Temp (!) 96.8 F (36 C) (Other (Comment)) Comment (Src): forehead  Resp 16   Ht 5' 7"  (1.702 m)   Wt 196 lb 3.2 oz (89 kg)   BMI 30.73 kg/m    Vitals:   08/24/19  1407  BP: 127/72  Pulse: 83  Resp: 16  Temp: (!) 96.8 F (36 C)  TempSrc: Other (Comment)  Weight: 196 lb 3.2 oz (89 kg)  Height: 5' 7"  (1.702 m)     Physical Exam Vitals signs reviewed.  Constitutional:      General: She is not in acute distress.    Appearance: Normal appearance. She is well-developed. She is obese. She is not ill-appearing or diaphoretic.  HENT:     Head: Normocephalic and atraumatic.     Right Ear: Tympanic membrane, ear canal and external ear normal.     Left Ear: Tympanic membrane, ear canal and external ear normal.     Nose: Nose normal.     Mouth/Throat:     Mouth: Mucous membranes are moist.     Pharynx: Oropharynx is clear. No oropharyngeal exudate.  Eyes:     General: No scleral icterus.       Right eye: No discharge.        Left eye: No discharge.     Extraocular Movements: Extraocular movements intact.     Conjunctiva/sclera: Conjunctivae normal.     Pupils: Pupils are equal, round, and reactive to light.  Neck:     Musculoskeletal: Normal range of motion and neck supple.     Thyroid: No thyromegaly.     Vascular: No carotid bruit or JVD.     Trachea: No tracheal deviation.  Cardiovascular:     Rate and Rhythm: Normal rate and regular rhythm.     Pulses: Normal pulses.     Heart sounds: Normal heart sounds. No murmur. No friction rub. No gallop.   Pulmonary:     Effort: Pulmonary effort is normal. No respiratory distress.     Breath sounds: Normal breath sounds. No wheezing or rales.  Chest:     Chest wall: No tenderness.  Abdominal:     General: Abdomen is flat. Bowel sounds are normal. There is  no distension.     Palpations: Abdomen is soft. There is no mass.     Tenderness: There is no abdominal tenderness. There is no guarding or rebound.  Musculoskeletal: Normal range of motion.        General: No tenderness.     Right lower leg: No edema.     Left lower leg: No edema.  Lymphadenopathy:     Cervical: No cervical adenopathy.   Skin:    General: Skin is warm and dry.     Capillary Refill: Capillary refill takes less than 2 seconds.     Findings: No rash.       Neurological:     General: No focal deficit present.     Mental Status: She is alert and oriented to person, place, and time. Mental status is at baseline.  Psychiatric:        Mood and Affect: Mood normal.        Behavior: Behavior normal.        Thought Content: Thought content normal.        Judgment: Judgment normal.    Diabetic Foot Exam - Simple   Simple Foot Form Diabetic Foot exam was performed with the following findings: Yes 08/24/2019  3:13 PM  Visual Inspection No deformities, no ulcerations, no other skin breakdown bilaterally: Yes Sensation Testing Intact to touch and monofilament testing bilaterally: Yes Pulse Check Posterior Tibialis and Dorsalis pulse intact bilaterally: Yes Comments      Depression Screen PHQ 2/9 Scores 08/24/2019 03/24/2018 07/15/2017 10/26/2016  PHQ - 2 Score 1 2 2 6   PHQ- 9 Score 3 4 4 7        Assessment & Plan:     Routine Health Maintenance and Physical Exam  Exercise Activities and Dietary recommendations Goals   None     Immunization History  Administered Date(s) Administered  . Influenza,inj,Quad PF,6+ Mos 08/29/2015  . Pneumococcal Polysaccharide-23 11/22/2012    Health Maintenance  Topic Date Due  . FOOT EXAM  11/30/1964  . TETANUS/TDAP  11/30/1973  . COLONOSCOPY  11/30/2004  . OPHTHALMOLOGY EXAM  09/17/2017  . HEMOGLOBIN A1C  09/23/2018  . INFLUENZA VACCINE  07/15/2019  . PAP SMEAR-Modifier  11/11/2019  . MAMMOGRAM  01/06/2020  . Hepatitis C Screening  Completed  . HIV Screening  Completed     Discussed health benefits of physical activity, and encouraged her to engage in regular exercise appropriate for her age and condition.    1. Annual physical exam Normal physical exam today. Will check labs as below and f/u pending lab results. If labs are stable and WNL she will  not need to have these rechecked for one year at her next annual physical exam. She is to call the office in the meantime if she has any acute issue, questions or concerns.  2. Breast cancer screening Breast exam today was normal. There is no family history of breast cancer. She does perform regular self breast exams. Mammogram was ordered as below. Information for [facility] was given to patient so she may schedule her mammogram at her convenience.  3. Situational anxiety Stable. Diagnosis pulled for medication refill. Continue current medical treatment plan. - sertraline (ZOLOFT) 100 MG tablet; Take 1 tablet (100 mg total) by mouth daily.  Dispense: 90 tablet; Refill: 1  4. Type 2 diabetes mellitus with stage 2 chronic kidney disease, without long-term current use of insulin (HCC) Stable. Diagnosis pulled for medication refill. Continue current medical treatment plan. Intolerant  to SGLT2 due to recurrent yeast infections.  - CBC with Differential/Platelet - Comprehensive metabolic panel - Hemoglobin A1c - Lipid panel - TSH - sitaGLIPtin (JANUVIA) 100 MG tablet; Take 1 tablet (100 mg total) by mouth daily.  Dispense: 90 tablet; Refill: 1 - metFORMIN (GLUCOPHAGE) 1000 MG tablet; Take 1 tablet (1,000 mg total) by mouth 2 (two) times daily with a meal.  Dispense: 180 tablet; Refill: 1 - glipiZIDE (GLUCOTROL XL) 10 MG 24 hr tablet; Take 1 tablet (10 mg total) by mouth daily with breakfast.  Dispense: 90 tablet; Refill: 1  5. Eustachian tube dysfunction, bilateral Stable. Diagnosis pulled for medication refill. Continue current medical treatment plan. - meclizine (ANTIVERT) 25 MG tablet; Take 1 tablet (25 mg total) by mouth 3 (three) times daily as needed for dizziness.  Dispense: 30 tablet; Refill: 0  6. Essential hypertension Stable. Diagnosis pulled for medication refill. Continue current medical treatment plan. - Comprehensive metabolic panel - Hemoglobin A1c - Lipid panel -  TSH - losartan (COZAAR) 25 MG tablet; Take 1 tablet (25 mg total) by mouth 2 (two) times daily.  Dispense: 180 tablet; Refill: 1 - hydrochlorothiazide (HYDRODIURIL) 25 MG tablet; Take 1 tablet (25 mg total) by mouth daily.  Dispense: 90 tablet; Refill: 1  7. Itching Stable. Diagnosis pulled for medication refill. Continue current medical treatment plan. - hydrOXYzine (ATARAX/VISTARIL) 10 MG tablet; Take 1 tablet (10 mg total) by mouth 3 (three) times daily as needed.  Dispense: 30 tablet; Refill: 0  8. Environmental asthma Reports chest tightness in heat only, not activity. Will send in albuterol inhaler as below. Does have smoking history so most likely COPD component as well.  - albuterol (VENTOLIN HFA) 108 (90 Base) MCG/ACT inhaler; Inhale 2 puffs into the lungs every 6 (six) hours as needed for wheezing or shortness of breath.  Dispense: 18 g; Refill: 0  --------------------------------------------------------------------    Mar Daring, PA-C  [facility]

## 2019-08-24 ENCOUNTER — Other Ambulatory Visit: Payer: Self-pay

## 2019-08-24 ENCOUNTER — Encounter: Payer: Self-pay | Admitting: Physician Assistant

## 2019-08-24 ENCOUNTER — Ambulatory Visit (INDEPENDENT_AMBULATORY_CARE_PROVIDER_SITE_OTHER): Payer: 59 | Admitting: Physician Assistant

## 2019-08-24 VITALS — BP 127/72 | HR 83 | Temp 96.8°F | Resp 16 | Ht 67.0 in | Wt 196.2 lb

## 2019-08-24 DIAGNOSIS — H6983 Other specified disorders of Eustachian tube, bilateral: Secondary | ICD-10-CM | POA: Diagnosis not present

## 2019-08-24 DIAGNOSIS — J45909 Unspecified asthma, uncomplicated: Secondary | ICD-10-CM | POA: Diagnosis not present

## 2019-08-24 DIAGNOSIS — Z0000 Encounter for general adult medical examination without abnormal findings: Secondary | ICD-10-CM

## 2019-08-24 DIAGNOSIS — Z1239 Encounter for other screening for malignant neoplasm of breast: Secondary | ICD-10-CM

## 2019-08-24 DIAGNOSIS — E1122 Type 2 diabetes mellitus with diabetic chronic kidney disease: Secondary | ICD-10-CM | POA: Diagnosis not present

## 2019-08-24 DIAGNOSIS — F418 Other specified anxiety disorders: Secondary | ICD-10-CM

## 2019-08-24 DIAGNOSIS — N182 Chronic kidney disease, stage 2 (mild): Secondary | ICD-10-CM | POA: Diagnosis not present

## 2019-08-24 DIAGNOSIS — L299 Pruritus, unspecified: Secondary | ICD-10-CM

## 2019-08-24 DIAGNOSIS — I10 Essential (primary) hypertension: Secondary | ICD-10-CM | POA: Diagnosis not present

## 2019-08-24 DIAGNOSIS — E782 Mixed hyperlipidemia: Secondary | ICD-10-CM

## 2019-08-24 MED ORDER — SERTRALINE HCL 100 MG PO TABS: 100.0000 mg | ORAL_TABLET | Freq: Every day | ORAL | 1 refills | Status: DC

## 2019-08-24 MED ORDER — GLIPIZIDE ER 10 MG PO TB24: 10.0000 mg | ORAL_TABLET | Freq: Every day | ORAL | 1 refills | Status: DC

## 2019-08-24 MED ORDER — HYDROCHLOROTHIAZIDE 25 MG PO TABS: 25.0000 mg | ORAL_TABLET | Freq: Every day | ORAL | 1 refills | Status: DC

## 2019-08-24 MED ORDER — MECLIZINE HCL 25 MG PO TABS: 25.0000 mg | ORAL_TABLET | Freq: Three times a day (TID) | ORAL | 0 refills | Status: DC | PRN

## 2019-08-24 MED ORDER — SITAGLIPTIN PHOSPHATE 100 MG PO TABS: 100.0000 mg | ORAL_TABLET | Freq: Every day | ORAL | 1 refills | Status: DC

## 2019-08-24 MED ORDER — LOSARTAN POTASSIUM 25 MG PO TABS: 25.0000 mg | ORAL_TABLET | Freq: Two times a day (BID) | ORAL | 1 refills | Status: DC

## 2019-08-24 MED ORDER — ALBUTEROL SULFATE HFA 108 (90 BASE) MCG/ACT IN AERS: 2.0000 | INHALATION_SPRAY | Freq: Four times a day (QID) | RESPIRATORY_TRACT | 0 refills | Status: DC | PRN

## 2019-08-24 MED ORDER — METFORMIN HCL 1000 MG PO TABS: 1000.0000 mg | ORAL_TABLET | Freq: Two times a day (BID) | ORAL | 1 refills | Status: DC

## 2019-08-24 MED ORDER — HYDROXYZINE HCL 10 MG PO TABS: 10.0000 mg | ORAL_TABLET | Freq: Three times a day (TID) | ORAL | 0 refills | Status: DC | PRN

## 2019-08-24 NOTE — Patient Instructions (Signed)

## 2019-08-25 LAB — CBC WITH DIFFERENTIAL/PLATELET
Basophils Absolute: 0.1 10*3/uL (ref 0.0–0.2)
Basos: 1 %
EOS (ABSOLUTE): 0.2 10*3/uL (ref 0.0–0.4)
Eos: 3 %
Hematocrit: 45.4 % (ref 34.0–46.6)
Hemoglobin: 14.5 g/dL (ref 11.1–15.9)
Immature Grans (Abs): 0 10*3/uL (ref 0.0–0.1)
Immature Granulocytes: 0 %
Lymphocytes Absolute: 1.7 10*3/uL (ref 0.7–3.1)
Lymphs: 23 %
MCH: 28 pg (ref 26.6–33.0)
MCHC: 31.9 g/dL (ref 31.5–35.7)
MCV: 88 fL (ref 79–97)
Monocytes Absolute: 0.5 10*3/uL (ref 0.1–0.9)
Monocytes: 7 %
Neutrophils Absolute: 5 10*3/uL (ref 1.4–7.0)
Neutrophils: 66 %
Platelets: 286 10*3/uL (ref 150–450)
RBC: 5.18 x10E6/uL (ref 3.77–5.28)
RDW: 12 % (ref 11.7–15.4)
WBC: 7.5 10*3/uL (ref 3.4–10.8)

## 2019-08-25 LAB — COMPREHENSIVE METABOLIC PANEL
ALT: 32 IU/L (ref 0–32)
AST: 29 IU/L (ref 0–40)
Albumin/Globulin Ratio: 1.2 (ref 1.2–2.2)
Albumin: 4.3 g/dL (ref 3.8–4.8)
Alkaline Phosphatase: 69 IU/L (ref 39–117)
BUN/Creatinine Ratio: 17 (ref 12–28)
BUN: 23 mg/dL (ref 8–27)
Bilirubin Total: 0.5 mg/dL (ref 0.0–1.2)
CO2: 25 mmol/L (ref 20–29)
Calcium: 9.5 mg/dL (ref 8.7–10.3)
Chloride: 85 mmol/L — ABNORMAL LOW (ref 96–106)
Creatinine, Ser: 1.33 mg/dL — ABNORMAL HIGH (ref 0.57–1.00)
GFR calc Af Amer: 49 mL/min/{1.73_m2} — ABNORMAL LOW (ref >59)
GFR calc non Af Amer: 42 mL/min/{1.73_m2} — ABNORMAL LOW (ref >59)
Globulin, Total: 3.7 g/dL (ref 1.5–4.5)
Glucose: 617 mg/dL (ref 65–99)
Potassium: 4.6 mmol/L (ref 3.5–5.2)
Sodium: 128 mmol/L — ABNORMAL LOW (ref 134–144)
Total Protein: 8 g/dL (ref 6.0–8.5)

## 2019-08-25 LAB — LIPID PANEL
Chol/HDL Ratio: 6.1 ratio — ABNORMAL HIGH (ref 0.0–4.4)
Cholesterol, Total: 232 mg/dL — ABNORMAL HIGH (ref 100–199)
HDL: 38 mg/dL — ABNORMAL LOW (ref >39)
LDL Chol Calc (NIH): 139 mg/dL — ABNORMAL HIGH (ref 0–99)
Triglycerides: 302 mg/dL — ABNORMAL HIGH (ref 0–149)
VLDL Cholesterol Cal: 55 mg/dL — ABNORMAL HIGH (ref 5–40)

## 2019-08-25 LAB — HEMOGLOBIN A1C
Est. average glucose Bld gHb Est-mCnc: 315 mg/dL
Hgb A1c MFr Bld: 12.6 % — ABNORMAL HIGH (ref 4.8–5.6)

## 2019-08-25 LAB — TSH: TSH: 2.99 u[IU]/mL (ref 0.450–4.500)

## 2019-08-25 MED ORDER — SIMVASTATIN 20 MG PO TABS: 20.0000 mg | ORAL_TABLET | Freq: Every day | ORAL | 3 refills | Status: DC

## 2019-08-25 MED ORDER — PIOGLITAZONE HCL 45 MG PO TABS: 45.0000 mg | ORAL_TABLET | Freq: Every day | ORAL | 1 refills | Status: DC

## 2019-08-25 NOTE — Addendum Note (Signed)
Addended by: Mar Daring on: 08/25/2019 12:36 PM   Modules accepted: Orders

## 2019-09-19 ENCOUNTER — Encounter: Payer: Self-pay | Admitting: Physician Assistant

## 2019-09-26 ENCOUNTER — Ambulatory Visit
Admission: RE | Admit: 2019-09-26 | Discharge: 2019-09-26 | Disposition: A | Discharge status: Home / Self Care | Payer: 59 | Source: Ambulatory Visit | Attending: Physician Assistant | Admitting: Physician Assistant

## 2019-09-26 DIAGNOSIS — Z1231 Encounter for screening mammogram for malignant neoplasm of breast: Secondary | ICD-10-CM

## 2019-09-27 ENCOUNTER — Telehealth: Payer: Self-pay

## 2019-09-27 NOTE — Telephone Encounter (Signed)
-----   Message from Mar Daring, PA-C sent at 09/27/2019  9:29 AM EDT ----- Normal mammogram. Repeat screening in one year.

## 2019-09-27 NOTE — Telephone Encounter (Signed)
Patient advised as directed below. 

## 2019-10-04 ENCOUNTER — Encounter: Payer: Self-pay | Admitting: Physician Assistant

## 2019-10-04 ENCOUNTER — Other Ambulatory Visit: Payer: Self-pay

## 2019-10-04 ENCOUNTER — Ambulatory Visit (INDEPENDENT_AMBULATORY_CARE_PROVIDER_SITE_OTHER): Payer: 59 | Admitting: Physician Assistant

## 2019-10-04 VITALS — BP 173/78 | HR 70 | Temp 97.6°F | Resp 16 | Wt 194.0 lb

## 2019-10-04 DIAGNOSIS — R300 Dysuria: Secondary | ICD-10-CM | POA: Diagnosis not present

## 2019-10-04 DIAGNOSIS — E1122 Type 2 diabetes mellitus with diabetic chronic kidney disease: Secondary | ICD-10-CM

## 2019-10-04 DIAGNOSIS — N182 Chronic kidney disease, stage 2 (mild): Secondary | ICD-10-CM

## 2019-10-04 LAB — POCT URINALYSIS DIPSTICK
Bilirubin, UA: NEGATIVE
Glucose, UA: NEGATIVE
Ketones, UA: NEGATIVE
Nitrite, UA: NEGATIVE
Protein, UA: NEGATIVE
Spec Grav, UA: 1.01 (ref 1.010–1.025)
Urobilinogen, UA: 0.2 E.U./dL
pH, UA: 5 (ref 5.0–8.0)

## 2019-10-04 MED ORDER — SULFAMETHOXAZOLE-TRIMETHOPRIM 800-160 MG PO TABS: 1.0000 | ORAL_TABLET | Freq: Two times a day (BID) | ORAL | 0 refills | Status: AC

## 2019-10-04 NOTE — Progress Notes (Signed)
Patient: [name] Female    DOB: Aug 31, 1956   63 y.o.   MRN: [id number] Visit Date: 10/04/2019  Today's Provider: Trinna Post, PA-C   Chief Complaint  Patient presents with  . Urinary Tract Infection   Subjective:     HPI Urinary Tract Infection: Patient with history of Type II DM complains of burning with urination, frequency and suprapubic pressure She has had symptoms for 2 days. Patient also complains of stomach ache. Patient denies fever. Patient does not have a history of recurrent UTI.  Patient does not have a history of pyelonephritis. Patient reports a pelvic pressure sensation.  Allergies  Allergen Reactions  . Jardiance [Empagliflozin] Itching    Recurrent mycotic infections  . Penicillins Rash and Other (See Comments)    Has patient had a PCN reaction causing immediate rash, facial/tongue/throat swelling, SOB or lightheadedness with hypotension: No Has patient had a PCN reaction causing severe rash involving mucus membranes or skin necrosis: No Has patient had a PCN reaction that required hospitalization No Has patient had a PCN reaction occurring within the last 10 years: No If all of the above answers are "NO", then may proceed with Cephalosporin use.      Current Outpatient Medications:  .  albuterol (VENTOLIN HFA) 108 (90 Base) MCG/ACT inhaler, Inhale 2 puffs into the lungs every 6 (six) hours as needed for wheezing or shortness of breath., Disp: 18 g, Rfl: 0 .  ALPRAZolam (XANAX) 0.5 MG tablet, TAKE 1/2 TO 1 TABLET BY MOUTH BEFORE WORK AND 1 TABLET BY MOUTH EVERY 8 HOURS AS NEEDED, Disp: 30 tablet, Rfl: 5 .  Blood Glucose Monitoring Suppl (ACCU-CHEK GUIDE) w/Device KIT, 1 kit by Does not apply route 2 (two) times daily. To check blood sugar twice daily, Disp: 1 kit, Rfl: 0 .  glipiZIDE (GLUCOTROL XL) 10 MG 24 hr tablet, Take 1 tablet (10 mg total) by mouth daily with breakfast., Disp: 90 tablet, Rfl: 1 .  glucose blood (ACCU-CHEK GUIDE) test  strip, To check blood sugar twice daily, Disp: 100 each, Rfl: 12 .  hydrochlorothiazide (HYDRODIURIL) 25 MG tablet, Take 1 tablet (25 mg total) by mouth daily., Disp: 90 tablet, Rfl: 1 .  hydrOXYzine (ATARAX/VISTARIL) 10 MG tablet, Take 1 tablet (10 mg total) by mouth 3 (three) times daily as needed., Disp: 30 tablet, Rfl: 0 .  Lancets (ACCU-CHEK SOFT TOUCH) lancets, To check blood sugar twice daily, Disp: 100 each, Rfl: 12 .  losartan (COZAAR) 25 MG tablet, Take 1 tablet (25 mg total) by mouth 2 (two) times daily., Disp: 180 tablet, Rfl: 1 .  meclizine (ANTIVERT) 25 MG tablet, Take 1 tablet (25 mg total) by mouth 3 (three) times daily as needed for dizziness., Disp: 30 tablet, Rfl: 0 .  metFORMIN (GLUCOPHAGE) 1000 MG tablet, Take 1 tablet (1,000 mg total) by mouth 2 (two) times daily with a meal., Disp: 180 tablet, Rfl: 1 .  Multiple Vitamin (MULTIVITAMIN) tablet, Take 1 tablet by mouth daily., Disp: , Rfl:  .  sertraline (ZOLOFT) 100 MG tablet, Take 1 tablet (100 mg total) by mouth daily., Disp: 90 tablet, Rfl: 1 .  simvastatin (ZOCOR) 20 MG tablet, Take 1 tablet (20 mg total) by mouth at bedtime., Disp: 90 tablet, Rfl: 3 .  sitaGLIPtin (JANUVIA) 100 MG tablet, Take 1 tablet (100 mg total) by mouth daily., Disp: 90 tablet, Rfl: 1 .  pioglitazone (ACTOS) 45 MG tablet, Take 1 tablet (45 mg total) by mouth  daily. (Patient not taking: Reported on 10/04/2019), Disp: 90 tablet, Rfl: 1  Review of Systems  Constitutional: Negative.   Genitourinary: Positive for dysuria, frequency and pelvic pain.    Social History   Tobacco Use  . Smoking status: Former Smoker    Packs/day: 1.50    Years: 25.00    Pack years: 37.50    Types: Cigarettes    Quit date: 03/24/2007    Years since quitting: 12.5  . Smokeless tobacco: Never Used  Substance Use Topics  . Alcohol use: Yes    Alcohol/week: 0.0 standard drinks    Comment: 1- every 3-4 months      Objective:   BP (!) 173/78 (BP Location: Left Arm,  Patient Position: Sitting, Cuff Size: Large)   Pulse 70   Temp 97.6 F (36.4 C) (Temporal)   Resp 16   Wt 194 lb (88 kg)   BMI 30.38 kg/m  Vitals:   10/04/19 1542 10/04/19 1543  BP: (!) 184/71 (!) 173/78  Pulse: 67 70  Resp: 16   Temp: 97.6 F (36.4 C)   TempSrc: Temporal   Weight: 194 lb (88 kg)   Body mass index is 30.38 kg/m.   Physical Exam Constitutional:      General: She is not in acute distress.    Appearance: She is well-developed. She is not diaphoretic.  Cardiovascular:     Rate and Rhythm: Normal rate and regular rhythm.  Pulmonary:     Effort: Pulmonary effort is normal.     Breath sounds: Normal breath sounds.  Abdominal:     General: Bowel sounds are normal. There is no distension.     Palpations: Abdomen is soft.     Tenderness: There is abdominal tenderness in the suprapubic area. There is no guarding or rebound.  Skin:    General: Skin is warm and dry.  Neurological:     Mental Status: She is alert and oriented to person, place, and time.  Psychiatric:        Behavior: Behavior normal.      Results for orders placed or performed in visit on 10/04/19  POCT urinalysis dipstick  Result Value Ref Range   Color, UA yellow    Clarity, UA clear    Glucose, UA Negative Negative   Bilirubin, UA Negative    Ketones, UA Negative    Spec Grav, UA 1.010 1.010 - 1.025   Blood, UA small    pH, UA 5.0 5.0 - 8.0   Protein, UA Negative Negative   Urobilinogen, UA 0.2 0.2 or 1.0 E.U./dL   Nitrite, UA Negative    Leukocytes, UA Moderate (2+) (A) Negative       Assessment & Plan    1. Dysuria  - POCT urinalysis dipstick - Urine Culture - sulfamethoxazole-trimethoprim (BACTRIM DS) 800-160 MG tablet; Take 1 tablet by mouth 2 (two) times daily for 3 days.  Dispense: 6 tablet; Refill: 0  2. Type 2 diabetes mellitus with stage 2 chronic kidney disease, without long-term current use of insulin ([place])  The entirety of the information documented in the  History of Present Illness, Review of Systems and Physical Exam were personally obtained by me. Portions of this information were initially documented by Lynford Humphrey, CMA and reviewed by me for thoroughness and accuracy.   F/u PRN       Trinna Post, PA-C  [facility]

## 2019-10-04 NOTE — Patient Instructions (Signed)

## 2019-10-06 ENCOUNTER — Ambulatory Visit
Admission: EM | Admit: 2019-10-06 | Discharge: 2019-10-06 | Disposition: A | Discharge status: Home / Self Care | Payer: 59 | Attending: Urgent Care | Admitting: Urgent Care

## 2019-10-06 ENCOUNTER — Telehealth (INDEPENDENT_AMBULATORY_CARE_PROVIDER_SITE_OTHER): Payer: 59 | Admitting: Physician Assistant

## 2019-10-06 ENCOUNTER — Encounter: Payer: Self-pay | Admitting: Emergency Medicine

## 2019-10-06 ENCOUNTER — Other Ambulatory Visit: Payer: Self-pay

## 2019-10-06 DIAGNOSIS — S0501XA Injury of conjunctiva and corneal abrasion without foreign body, right eye, initial encounter: Secondary | ICD-10-CM | POA: Diagnosis not present

## 2019-10-06 DIAGNOSIS — W458XXA Other foreign body or object entering through skin, initial encounter: Secondary | ICD-10-CM | POA: Diagnosis not present

## 2019-10-06 DIAGNOSIS — N3090 Cystitis, unspecified without hematuria: Secondary | ICD-10-CM | POA: Diagnosis not present

## 2019-10-06 DIAGNOSIS — B373 Candidiasis of vulva and vagina: Secondary | ICD-10-CM | POA: Diagnosis not present

## 2019-10-06 DIAGNOSIS — I10 Essential (primary) hypertension: Secondary | ICD-10-CM

## 2019-10-06 DIAGNOSIS — B3731 Acute candidiasis of vulva and vagina: Secondary | ICD-10-CM

## 2019-10-06 LAB — URINE CULTURE

## 2019-10-06 MED ORDER — TETRACAINE HCL 0.5 % OP SOLN
1.0000 [drp] | Freq: Once | OPHTHALMIC | Status: AC
  Administered 2019-10-06: 1 [drp] via OPHTHALMIC

## 2019-10-06 MED ORDER — FLUORESCEIN SODIUM 1 MG OP STRP
1.0000 | ORAL_STRIP | Freq: Once | OPHTHALMIC | Status: AC
  Administered 2019-10-06: 1 via OPHTHALMIC

## 2019-10-06 MED ORDER — ERYTHROMYCIN 5 MG/GM OP OINT: TOPICAL_OINTMENT | OPHTHALMIC | 0 refills | Status: DC

## 2019-10-06 MED ORDER — CEPHALEXIN 500 MG PO CAPS: 500.0000 mg | ORAL_CAPSULE | Freq: Two times a day (BID) | ORAL | 0 refills | Status: AC

## 2019-10-06 NOTE — ED Triage Notes (Signed)
Patient c/o right eye redness and pain that started 2 hours ago.  Patient denies injury to her eye. Patient denies fevers.

## 2019-10-06 NOTE — Telephone Encounter (Signed)
Left detailed message on vm.

## 2019-10-06 NOTE — Telephone Encounter (Signed)
Sent in keflex 500 mg BID x 5 days since UTI resistant to bactrim. She has PCN allergy listed but has tolerated keflex before.

## 2019-10-06 NOTE — Telephone Encounter (Signed)
Pt advised.   Thanks,   -Laura  

## 2019-10-06 NOTE — Discharge Instructions (Addendum)
It was very nice seeing you today in clinic. Thank you for entrusting me with your care.   Use antibiotic ointment as directed. May use saline drops/artificial tears to help lubricate the eye. If your vision changes, or you are not improving overall, follow up with your regular doctor for further evaluation.   If your symptoms/condition worsens, please seek follow up care either here or in the ER. Please remember, our [place] providers are "right here with you" when you need [place].   Again, it was my pleasure to take care of you today. Thank you for choosing our clinic. I hope that you start to feel better quickly.   Honor Loh, MSN, APRN, FNP-C, CEN Advanced Practice Provider [facility]

## 2019-10-07 NOTE — ED Provider Notes (Signed)
[place], [place]   Name: [name] DOB: 1956/05/16 MRN: [id number] CSN: [id number] PCP: Mar Daring, PA-C  Arrival date and time:  10/06/19 1404  Chief Complaint:  Eye Problem   NOTE: Prior to seeing the patient today, I have reviewed the triage nursing documentation and vital signs. Clinical staff has updated patient's PMH/PSHx, current medication list, and drug allergies/intolerances to ensure comprehensive history available to assist in medical decision making.   History:   HPI: [name] is a 63 y.o. female who presents today with complaints of pain and redness in her RIGHT eye that began earlier today. She thinks that she got coming in her eye when her grandchildren were "beating a newspaper on the seats in the car". Patient things that dust/durt flew into her eye. She has been rubbing her eye a lot, which has made it red and irritated. Patient reports a scratchy feeling inside of her eye. She notes difficulty keeping her eye open secondary to the pain. Patient with excessive tearing. Vision is not blurred. Patient notes that eye irritation is exacerbated by light (photophobia). She does not wear contact lenses. Due to the acute nature of her complaint, patient has not tried any over the counter eye drops or pain medications to help reduce/relieve her current symptoms.   Additionally, patient presents with an elevated blood pressure of 204/107. Patient states, "I forgot to take my medication. I have been running around all day and I have just not gotten to it". She reports that her current antihypertensive manage her blood pressure well. She states, "my eye hurts too. I know that pain really make my pressure go up sometimes". Patient denies any associated chest pain, shortness of breath, palpitations, or headaches.   Past Medical History:  Diagnosis Date  . Anxiety   . Arthritis   . Cataract    left  . Depression   . Diabetes mellitus without complication ([place])    . Environmental and seasonal allergies   . GERD (gastroesophageal reflux disease)   . Hyperlipidemia   . Hypertension   . Joint pain    as reported by patient  . Urinary incontinence    as stated by patient    Past Surgical History:  Procedure Laterality Date  . BREAST EXCISIONAL BIOPSY Right 1999   neg  . BREAST LUMPECTOMY Right 2005   as reported by patient  . CATARACT EXTRACTION  January 2013  . CATARACT EXTRACTION W/PHACO Left 12/02/2016   Procedure: CATARACT EXTRACTION PHACO AND INTRAOCULAR LENS PLACEMENT (IOC);  Surgeon: Estill Cotta, MD;  Location: [facility] ORS;  Service: Ophthalmology;  Laterality: Left;  [place] 2:14 AP% 28.4 CDE 59.73 Fluid pack lot # [id number]  . DIAGNOSTIC LAPAROSCOPY    . TOTAL HIP ARTHROPLASTY Right 04/04/2018   Procedure: TOTAL HIP ARTHROPLASTY;  Surgeon: Dereck Leep, MD;  Location: [facility] ORS;  Service: Orthopedics;  Laterality: Right;    Family History  Problem Relation Age of Onset  . Healthy Brother   . Aneurysm Mother        brain  . Diabetes Father   . CAD Father   . Heart failure Father   . Colon cancer Father   . Cancer Father   . Alzheimer's disease Paternal Grandmother   . Dementia Paternal Grandmother   . Mental illness Paternal Grandfather   . Healthy Daughter   . Healthy Son   . Breast cancer Neg Hx     Social History   Tobacco Use  .  Smoking status: Former Smoker    Packs/day: 1.50    Years: 25.00    Pack years: 37.50    Types: Cigarettes    Quit date: 03/24/2007    Years since quitting: 12.5  . Smokeless tobacco: Never Used  Substance Use Topics  . Alcohol use: Yes    Alcohol/week: 0.0 standard drinks    Comment: 1- every 3-4 months  . Drug use: No    Patient Active Problem List   Diagnosis Date Noted  . Status post total replacement of hip 04/04/2018  . Primary osteoarthritis of right hip 02/20/2018  . Allergic rhinitis 10/28/2015  . Acute stress disorder 08/01/2015  . Anxiety 08/01/2015  . Clinical  depression 08/01/2015  . Diabetes ([place]) 08/01/2015  . Diverticulosis of colon 08/01/2015  . Generalized pruritus 08/01/2015  . BP (high blood pressure) 08/01/2015  . Cannot sleep 08/01/2015  . Adiposity 08/01/2015  . Detrusor muscle hypertonia 08/01/2015  . Hypercholesterolemia without hypertriglyceridemia 08/01/2015  . Female stress incontinence 08/01/2015  . Avitaminosis D 08/01/2015    Home Medications:    Current Meds  Medication Sig  . glipiZIDE (GLUCOTROL XL) 10 MG 24 hr tablet Take 1 tablet (10 mg total) by mouth daily with breakfast.  . hydrochlorothiazide (HYDRODIURIL) 25 MG tablet Take 1 tablet (25 mg total) by mouth daily.  Marland Kitchen losartan (COZAAR) 25 MG tablet Take 1 tablet (25 mg total) by mouth 2 (two) times daily.  . metFORMIN (GLUCOPHAGE) 1000 MG tablet Take 1 tablet (1,000 mg total) by mouth 2 (two) times daily with a meal.  . Multiple Vitamin (MULTIVITAMIN) tablet Take 1 tablet by mouth daily.  . pioglitazone (ACTOS) 45 MG tablet Take 1 tablet (45 mg total) by mouth daily.  . sertraline (ZOLOFT) 100 MG tablet Take 1 tablet (100 mg total) by mouth daily.  . simvastatin (ZOCOR) 20 MG tablet Take 1 tablet (20 mg total) by mouth at bedtime.  . sitaGLIPtin (JANUVIA) 100 MG tablet Take 1 tablet (100 mg total) by mouth daily.  Marland Kitchen sulfamethoxazole-trimethoprim (BACTRIM DS) 800-160 MG tablet Take 1 tablet by mouth 2 (two) times daily for 3 days.    Allergies:   Jardiance [empagliflozin] and Penicillins  Review of Systems (ROS): Review of Systems  Constitutional: Negative for chills and fever.  Eyes: Positive for photophobia, pain, discharge and redness. Negative for visual disturbance.  Respiratory: Negative for cough and shortness of breath.   Cardiovascular: Negative for chest pain and palpitations.  Gastrointestinal: Negative for nausea and vomiting.  Musculoskeletal: Negative for back pain, neck pain and neck stiffness.  Skin: Negative for color change, pallor and rash.   Neurological: Negative for dizziness, syncope, weakness, numbness and headaches.  Psychiatric/Behavioral: The patient is nervous/anxious.   All other systems reviewed and are negative.    Vital Signs: Today's Vitals   10/06/19 1414 10/06/19 1417 10/06/19 1451  BP:  (!) 204/107   Pulse:  72   Resp:  16   Temp:  98.3 F (36.8 C)   TempSrc:  Oral   SpO2:  95%   Weight: 190 lb (86.2 kg)    Height: 5\' 7"  (1.702 m)    PainSc: 5   5     Physical Exam: Physical Exam  Constitutional: She is oriented to person, place, and time and well-developed, well-nourished, and in no distress.  HENT:  Head: Normocephalic and atraumatic.  Mouth/Throat: Mucous membranes are normal.  Eyes:  RIGHT EYE: Excessive tearing from RIGHT eye. (+) PERRLA. EOM normal as compared  contralaterally. Difficulty passively keeping eye open.  Cardiovascular: Normal rate, regular rhythm, normal heart sounds and intact distal pulses.  Pulmonary/Chest: Effort normal and breath sounds normal.  Neurological: She is alert and oriented to person, place, and time. Gait normal.  Skin: Skin is warm and dry. No rash noted.  Psychiatric: Memory, affect and judgment normal. Her mood appears anxious.  Nursing note and vitals reviewed.  Visual Acuity: Right Eye Distance: 20/20 uncorrected Left Eye Distance: 20/50 uncorrected Bilateral Distance: 20/20 uncorrected   Wood's lamp examination of the RIGHT eye . Consent: Procedure explained to patient prior to performing. Drug allergies reviewed. Verbal consent obtained.  . Procedural course: Topical anesthetic (tetracaine) gtts instilled into the affected eye. Adequate time allowed for medication to be effective. Fluorescein stain applied. Eye thoroughly examined under black light.  . Findings: Examination revealed increased fluorescein uptake in both the lower inner and outer aspects of the cornea, which is consistent with abrasions. . Tolerance: Patient tolerated exam/procedure  well  Urgent Care Treatments / Results:   LABS: PLEASE NOTE: all labs that were ordered this encounter are listed, however only abnormal results are displayed. Labs Reviewed - No data to display  EKG: -None  RADIOLOGY: No results found.  PROCEDURES: Procedures  MEDICATIONS RECEIVED THIS VISIT: Medications  tetracaine (PONTOCAINE) 0.5 % ophthalmic solution 1 drop (1 drop Right Eye Given by Other 10/06/19 1449)  fluorescein ophthalmic strip 1 strip (1 strip Right Eye Given by Other 10/06/19 1450)    PERTINENT CLINICAL COURSE NOTES/UPDATES:   Initial Impression / Assessment and Plan / Urgent Care Course:  Pertinent labs & imaging results that were available during my care of the patient were personally reviewed by me and considered in my medical decision making (see lab/imaging section of note for values and interpretations).  YODIT SMALE is a 63 y.o. female who presents to [facility] today with complaints of Eye Problem   Patient is well appearing overall in clinic today. She does not appear to be in any acute distress. Presenting symptoms (see HPI) and exam as documented above. Fluorescein staining and exam under black light revealed (+) corneal abrasions to the RIGHT eye. There are two distinct areas of dye uptake; see procedure note. Pain improved with eye wash and tetracaine gtts. Will cover with a 5 day course of ERY ophthalmic ointment. Patient to follow up with ophthalmology if not improving or if she develops any changes to her vision. May use Tylenol and/or Ibuprofen as needed for discomfort. Discussed that artificial tears and cool compresses can be use to soothe the eye. She was advised to avoid rubbing her eye.   Regarding her blood pressure. She presents to clinic today with a markedly elevated reading. Following procedure (instillation of tetracaine), pain reduced and patient was less anxious. BP was retaken by me and found to be 188/92. Again, patient has no  symptoms. She has not taken her prescribed antihypertensives today. She was encouraged to take medications as soon as she gets home. Discussed that any chest pain, shortness of breath, palpitations, weakness, or severe headache should be evaluated in the emergency department. Patient to follow up with PCP regarding ongoing management of her HTN.   Current clinical condition warrants patient being out of work in order to recover from her current injury/illness. She was provided with the appropriate documentation to provide to her place of employment that will allow for her to RTW on 10/08/2019 with no restrictions.   Discussed follow up with primary  care physician in 1 week for re-evaluation. I have reviewed the follow up and strict return precautions for any new or worsening symptoms. Patient is aware of symptoms that would be deemed urgent/emergent, and would thus require further evaluation either here or in the emergency department. At the time of discharge, she verbalized understanding and consent with the discharge plan as it was reviewed with her. All questions were fielded by provider and/or clinic staff prior to patient discharge.    Final Clinical Impressions / Urgent Care Diagnoses:   Final diagnoses:  Abrasion of right cornea, initial encounter  Hypertension, unspecified type    New Prescriptions:  [place] Controlled Substance Registry consulted? Not Applicable  Meds ordered this encounter  Medications  . tetracaine (PONTOCAINE) 0.5 % ophthalmic solution 1 drop  . fluorescein ophthalmic strip 1 strip  . erythromycin ophthalmic ointment    Sig: Place a 1/2 inch ribbon of ointment into the lower eyelid QID x 5 days.    Dispense:  1 g    Refill:  0    Recommended Follow up Care:  Patient encouraged to follow up with the following provider within the specified time frame, or sooner as dictated by the severity of her symptoms. As always, she was instructed that for any urgent/emergent care  needs, she should seek care either here or in the emergency department for more immediate evaluation.  Follow-up Information    Mar Daring, PA-C In 1 week.   Specialty: Family Medicine Why: General reassessment of symptoms if not improving Contact information: [address] [place] [id number] [phone]         NOTE: This note was prepared using Dragon dictation software along with smaller phrase technology. Despite my best ability to proofread, there is the potential that transcriptional errors may still occur from this process, and are completely unintentional.    Karen Kitchens, NP 10/07/19 2235

## 2019-11-03 MED ORDER — FLUCONAZOLE 150 MG PO TABS: ORAL_TABLET | ORAL | 0 refills | Status: DC

## 2019-11-03 NOTE — Addendum Note (Signed)
Addended by: Trinna Post on: 11/03/2019 01:37 PM   Modules accepted: Orders, Level of Service

## 2019-11-03 NOTE — Telephone Encounter (Signed)
   Subjective:   Patient ID: [name], female    DOB: Nov 01, 1956, 64 y.o.   MRN: [id number]  [name] is a 63 y.o. female presenting on 10/06/2019 for vaginal discharge and vaginal itching.   Virtual Visit via Telephone Note  I connected with [name] on 11/03/2019 by telephone and verified that I am speaking with the correct person using two identifiers.   I discussed the limitations, risks, security and privacy concerns of performing an evaluation and management service by telephone and the availability of in person appointments. I also discussed with the patient that there may be a patient responsible charge related to this service. The patient expressed understanding and agreed to proceed.   HPI  Patient presenting with several days vaginal discharge and itching after taking keflex 500 mg BID x 5 days for UTI last month.    Social History   Tobacco Use  . Smoking status: Former Smoker    Packs/day: 1.50    Years: 25.00    Pack years: 37.50    Types: Cigarettes    Quit date: 03/24/2007    Years since quitting: 12.6  . Smokeless tobacco: Never Used  Substance Use Topics  . Alcohol use: Yes    Alcohol/week: 0.0 standard drinks    Comment: 1- every 3-4 months  . Drug use: No    @ROSBYAGE @ Per HPI unless specifically indicated above  @SUBJECTIVEEND @  Objective:  @OBJNOHEADERBEGIN @ @VS @  Wt Readings from Last 3 Encounters:  10/06/19 190 lb (86.2 kg)  10/04/19 194 lb (88 kg)  08/24/19 196 lb 3.2 oz (89 kg)    @PHYSEXAM @ Results for orders placed or performed in visit on 10/04/19  Urine Culture   Specimen: Urine   UR  Result Value Ref Range   Urine Culture, Routine Final report (A)    Organism ID, Bacteria Escherichia coli (A)    Antimicrobial Susceptibility Comment   POCT urinalysis dipstick  Result Value Ref Range   Color, UA yellow    Clarity, UA clear    Glucose, UA Negative Negative   Bilirubin, UA Negative    Ketones, UA Negative     Spec Grav, UA 1.010 1.010 - 1.025   Blood, UA small    pH, UA 5.0 5.0 - 8.0   Protein, UA Negative Negative   Urobilinogen, UA 0.2 0.2 or 1.0 E.U./dL   Nitrite, UA Negative    Leukocytes, UA Moderate (2+) (A) Negative    Assessment & Plan:  1. Vaginal candida  Treat as below.   - fluconazole (DIFLUCAN) 150 MG tablet; Take 1 pill on day 1, then second pill three days later if still symptomatic.  Dispense: 2 tablet; Refill: 0  2. Cystitis  - cephALEXin (KEFLEX) 500 MG capsule; Take 1 capsule (500 mg total) by mouth 2 (two) times daily for 5 days.  Dispense: 10 capsule; Refill: 0   Follow up plan: No follow-ups on file.  Carles Collet, PA-C [facility] 11/03/2019, 1:31 PM

## 2019-11-03 NOTE — Telephone Encounter (Signed)
From [facility] 

## 2019-11-03 NOTE — Telephone Encounter (Signed)
Patient states she feels this medication has caused her to have a yeast infection. She inquired if a medication can be called into pharmacy. She states she is experiencing vaginal irritation and discomfort.

## 2019-11-03 NOTE — Telephone Encounter (Signed)
Sent diflucan 150 mg #2. Doesn't need more keflex.

## 2019-11-03 NOTE — Telephone Encounter (Signed)
LVMTRC 

## 2019-11-06 NOTE — Telephone Encounter (Signed)
Patient was advised and states picked up the medication on Friday.

## 2019-11-16 ENCOUNTER — Encounter: Payer: Self-pay | Admitting: Adult Health

## 2019-11-16 ENCOUNTER — Ambulatory Visit: Payer: 59 | Admitting: Adult Health

## 2019-11-16 ENCOUNTER — Other Ambulatory Visit: Payer: Self-pay

## 2019-11-16 VITALS — BP 136/80 | HR 72 | Temp 96.6°F | Resp 16 | Wt 198.8 lb

## 2019-11-16 DIAGNOSIS — N390 Urinary tract infection, site not specified: Secondary | ICD-10-CM

## 2019-11-16 DIAGNOSIS — L299 Pruritus, unspecified: Secondary | ICD-10-CM | POA: Diagnosis not present

## 2019-11-16 DIAGNOSIS — R319 Hematuria, unspecified: Secondary | ICD-10-CM | POA: Diagnosis not present

## 2019-11-16 DIAGNOSIS — R399 Unspecified symptoms and signs involving the genitourinary system: Secondary | ICD-10-CM | POA: Diagnosis not present

## 2019-11-16 LAB — POCT URINALYSIS DIPSTICK
Glucose, UA: NEGATIVE
Nitrite, UA: NEGATIVE
Protein, UA: POSITIVE — AB
Spec Grav, UA: 1.025 (ref 1.010–1.025)
Urobilinogen, UA: 1 E.U./dL
pH, UA: 5 (ref 5.0–8.0)

## 2019-11-16 MED ORDER — NYSTATIN 100000 UNIT/GM EX CREA: 1.0000 "application " | TOPICAL_CREAM | Freq: Two times a day (BID) | CUTANEOUS | 0 refills | Status: DC

## 2019-11-16 MED ORDER — HYDROXYZINE HCL 10 MG PO TABS: 10.0000 mg | ORAL_TABLET | Freq: Three times a day (TID) | ORAL | 0 refills | Status: DC | PRN

## 2019-11-16 MED ORDER — LEVOFLOXACIN 500 MG PO TABS: 500.0000 mg | ORAL_TABLET | Freq: Every day | ORAL | 0 refills | Status: DC

## 2019-11-16 NOTE — Progress Notes (Signed)
Patient: [name] Female    DOB: Jul 21, 1956   63 y.o.   MRN: [id number] Visit Date: 11/16/2019  Today's Provider: Marcille Buffy, FNP   No chief complaint on file.  Subjective:     Urinary Tract Infection  This is a recurrent problem. The current episode started more than 1 month ago. The problem occurs every urination. The problem has been waxing and waning. There has been no fever. Associated symptoms include frequency and hematuria. Pertinent negatives include no chills, discharge, flank pain, hesitancy, nausea, possible pregnancy, sweats, urgency or vomiting. Associated symptoms comments: Pelvic pressure . She has tried antibiotics for the symptoms. The treatment provided moderate relief. Her past medical history is significant for recurrent UTIs.   Pelvic pressure. She reports her last UTI responded to the antibiotic she was switched from Bactrim to Keflex with Ecoli 10/04/2019 per  culture.   She states " I am not sure if it ever went away all together".  Blood sugars have been elevated 150's this week.  No reported symptoms of pyelonephritis.   Vaginal irritation/ itching , started yesterday. No recent antibiotics. She is diabetic. She has no discharge. Denies any rash or sores.  She reports she has itching vaginally and Sonia Baller  PA gives her Hydroxyzine - she reports only taking at bedtime and only occasionally. She request refill.  She denies any abnormal vaginal discharge or dryness.   Patient  denies any fever, body aches,chills, rash, chest pain, shortness of breath, nausea, vomiting, or diarrhea.   Allergies  Allergen Reactions  . Jardiance [Empagliflozin] Itching    Recurrent mycotic infections  . Penicillins Rash and Other (See Comments)    Has patient had a PCN reaction causing immediate rash, facial/tongue/throat swelling, SOB or lightheadedness with hypotension: No Has patient had a PCN reaction causing severe rash involving mucus membranes or  skin necrosis: No Has patient had a PCN reaction that required hospitalization No Has patient had a PCN reaction occurring within the last 10 years: No If all of the above answers are "NO", then may proceed with Cephalosporin use.      Current Outpatient Medications:  .  albuterol (VENTOLIN HFA) 108 (90 Base) MCG/ACT inhaler, Inhale 2 puffs into the lungs every 6 (six) hours as needed for wheezing or shortness of breath., Disp: 18 g, Rfl: 0 .  ALPRAZolam (XANAX) 0.5 MG tablet, TAKE 1/2 TO 1 TABLET BY MOUTH BEFORE WORK AND 1 TABLET BY MOUTH EVERY 8 HOURS AS NEEDED, Disp: 30 tablet, Rfl: 5 .  Blood Glucose Monitoring Suppl (ACCU-CHEK GUIDE) w/Device KIT, 1 kit by Does not apply route 2 (two) times daily. To check blood sugar twice daily, Disp: 1 kit, Rfl: 0 .  glipiZIDE (GLUCOTROL XL) 10 MG 24 hr tablet, Take 1 tablet (10 mg total) by mouth daily with breakfast., Disp: 90 tablet, Rfl: 1 .  glucose blood (ACCU-CHEK GUIDE) test strip, To check blood sugar twice daily, Disp: 100 each, Rfl: 12 .  hydrochlorothiazide (HYDRODIURIL) 25 MG tablet, Take 1 tablet (25 mg total) by mouth daily., Disp: 90 tablet, Rfl: 1 .  Lancets (ACCU-CHEK SOFT TOUCH) lancets, To check blood sugar twice daily, Disp: 100 each, Rfl: 12 .  losartan (COZAAR) 25 MG tablet, Take 1 tablet (25 mg total) by mouth 2 (two) times daily., Disp: 180 tablet, Rfl: 1 .  metFORMIN (GLUCOPHAGE) 1000 MG tablet, Take 1 tablet (1,000 mg total) by mouth 2 (two) times daily with a meal., Disp:  180 tablet, Rfl: 1 .  Multiple Vitamin (MULTIVITAMIN) tablet, Take 1 tablet by mouth daily., Disp: , Rfl:  .  pioglitazone (ACTOS) 45 MG tablet, Take 1 tablet (45 mg total) by mouth daily., Disp: 90 tablet, Rfl: 1 .  sertraline (ZOLOFT) 100 MG tablet, Take 1 tablet (100 mg total) by mouth daily., Disp: 90 tablet, Rfl: 1 .  simvastatin (ZOCOR) 20 MG tablet, Take 1 tablet (20 mg total) by mouth at bedtime., Disp: 90 tablet, Rfl: 3 .  sitaGLIPtin (JANUVIA) 100  MG tablet, Take 1 tablet (100 mg total) by mouth daily., Disp: 90 tablet, Rfl: 1 .  erythromycin ophthalmic ointment, Place a 1/2 inch ribbon of ointment into the lower eyelid QID x 5 days. (Patient not taking: Reported on 11/16/2019), Disp: 1 g, Rfl: 0 .  fluconazole (DIFLUCAN) 150 MG tablet, Take 1 pill on day 1, then second pill three days later if still symptomatic. (Patient not taking: Reported on 11/16/2019), Disp: 2 tablet, Rfl: 0 .  hydrOXYzine (ATARAX/VISTARIL) 10 MG tablet, Take 1 tablet (10 mg total) by mouth 3 (three) times daily as needed. (Patient not taking: Reported on 11/16/2019), Disp: 30 tablet, Rfl: 0 .  meclizine (ANTIVERT) 25 MG tablet, Take 1 tablet (25 mg total) by mouth 3 (three) times daily as needed for dizziness. (Patient not taking: Reported on 11/16/2019), Disp: 30 tablet, Rfl: 0  Review of Systems  Constitutional: Negative for activity change, appetite change, chills, diaphoresis, fatigue, fever and unexpected weight change.  Gastrointestinal: Negative.  Negative for nausea and vomiting.  Genitourinary: Positive for dysuria, frequency and hematuria. Negative for decreased urine volume, difficulty urinating, dyspareunia, enuresis, flank pain, genital sores, hesitancy, menstrual problem, pelvic pain (pressure- denies any pain. ), urgency, vaginal bleeding, vaginal discharge and vaginal pain.       Vaginal itching. Seh reopirts she usually gets with her UTI's.   Neurological: Negative for dizziness, speech difficulty, weakness, light-headedness, numbness and headaches.  Hematological: Negative.   Psychiatric/Behavioral: Negative.     Social History   Tobacco Use  . Smoking status: Former Smoker    Packs/day: 1.50    Years: 25.00    Pack years: 37.50    Types: Cigarettes    Quit date: 03/24/2007    Years since quitting: 12.6  . Smokeless tobacco: Never Used  Substance Use Topics  . Alcohol use: Yes    Alcohol/week: 0.0 standard drinks    Comment: 1- every 3-4  months      Objective:    Physical Exam Vitals signs reviewed.  Constitutional:      Appearance: Normal appearance. She is well-groomed.  HENT:     Head: Normocephalic and atraumatic.     Nose: Nose normal.     Mouth/Throat:     Mouth: Mucous membranes are moist.  Neck:     Musculoskeletal: Normal range of motion.  Cardiovascular:     Rate and Rhythm: Normal rate and regular rhythm.     Heart sounds: No murmur. No friction rub. No gallop.   Pulmonary:     Effort: Pulmonary effort is normal.     Breath sounds: Normal breath sounds.  Abdominal:     General: Bowel sounds are normal. There is no distension or abdominal bruit. There are no signs of injury.     Palpations: Abdomen is soft.     Tenderness: There is abdominal tenderness (mild tenderness ) in the suprapubic area. There is no right CVA tenderness, left CVA tenderness, guarding or rebound.  Genitourinary:    Comments: Provider recommended vaginal examination externa -with offered chaperon.  patient declined.  Skin:    General: Skin is warm.     Capillary Refill: Capillary refill takes less than 2 seconds.  Neurological:     Mental Status: She is alert and oriented to person, place, and time.  Psychiatric:        Mood and Affect: Mood normal.        Behavior: Behavior normal. Behavior is cooperative.        Thought Content: Thought content normal.        Judgment: Judgment normal.      Results for orders placed or performed in visit on 11/16/19  POCT urinalysis dipstick  Result Value Ref Range   Color, UA ORANGE    Clarity, UA clear    Glucose, UA Negative Negative   Bilirubin, UA small    Ketones, UA trace    Spec Grav, UA 1.025 1.010 - 1.025   Blood, UA large    pH, UA 5.0 5.0 - 8.0   Protein, UA Positive (A) Negative   Urobilinogen, UA 1.0 0.2 or 1.0 E.U./dL   Nitrite, UA negative    Leukocytes, UA Large (3+) (A) Negative   Appearance     Odor         Assessment & Plan    1. Urinary  symptom or sign/ urinary tract infection  Antibiotic as below. Advised to monitor blood glucose. Increase hydration clear fluids.  - Urine Culture - POCT urinalysis dipstick  2. Itching Discussed vaginal examination recommended, patient declines. She will use Nystatin cream and return to the clinic if she has symptoms worsening or not resolved.  Patient requested the Atarax, she has been using sparingly, she is advised of side effects and risks and to only use as needed preferably once daily 9 she is advised to try half the tablet instead of whole to see if itching resolves with lower dose) and not to drive or do things that require attention. Can cause drowsiness.  Meds ordered this encounter  Medications  . hydrOXYzine (ATARAX/VISTARIL) 10 MG tablet    Sig: Take 1 tablet (10 mg total) by mouth every 8 (eight) hours as needed for itching. Will cause drowsiness.    Dispense:  30 tablet    Refill:  0  . levofloxacin (LEVAQUIN) 500 MG tablet    Sig: Take 1 tablet (500 mg total) by mouth daily.    Dispense:  7 tablet    Refill:  0  . nystatin cream (MYCOSTATIN)    Sig: Apply 1 application topically 2 (two) times daily.    Dispense:  30 g    Refill:  0   Discussed black box warning on Levaquin and when to call if muscle pain.  Advised patient call the office or your primary care doctor for an appointment if no improvement within 72 hours or if any symptoms change or worsen at any time  Advised ER or urgent Care if after hours or on weekend. Call 911 for emergency symptoms at any time.Patinet verbalized understanding of all instructions given/reviewed and treatment plan and has no further questions or concerns at this time.    The entirety of the information documented in the History of Present Illness, Review of Systems and Physical Exam were personally obtained by me. Portions of this information were initially documented by the  Certified Medical Assistant whose name is documented in [place]  and reviewed by me for thoroughness  and accuracy.  I have personally performed the exam and reviewed the chart and it is accurate to the best of my knowledge.  Haematologist has been used and any errors in dictation or transcription are unintentional.  Kelby Aline. [place], [facility]

## 2019-11-16 NOTE — Patient Instructions (Signed)
Skin Yeast Infection  A skin yeast infection is a condition in which there is an overgrowth of yeast (candida) that normally lives on the skin. This condition usually occurs in areas of the skin that are constantly warm and moist, such as the armpits or the groin. What are the causes? This condition is caused by a change in the normal balance of the yeast and bacteria that live on the skin. What increases the risk? You are more likely to develop this condition if you:  Are obese.  Are pregnant.  Take birth control pills.  Have diabetes.  Take antibiotic medicines.  Take steroid medicines.  Are malnourished.  Have a weak body defense system (immune system).  Are 30 years of age or older.  Wear tight clothing. What are the signs or symptoms? The most common symptom of this condition is itchiness in the affected area. Other symptoms include:  Red, swollen area of the skin.  Bumps on the skin. How is this diagnosed?  This condition is diagnosed with a medical history and physical exam.  Your health care provider may check for yeast by taking light scrapings of the skin to be viewed under a microscope. How is this treated? This condition is treated with medicine. Medicines may be prescribed or be available over the counter. The medicines may be:  Taken by mouth (orally).  Applied as a cream or powder to your skin. Follow these instructions at home:   Take or apply over-the-counter and prescription medicines only as told by your health care provider.  Maintain a healthy weight. If you need help losing weight, talk with your health care provider.  Keep your skin clean and dry.  If you have diabetes, keep your blood sugar under control.  Keep all follow-up visits as told by your health care provider. This is important. Contact a health care provider if:  Your symptoms go away and then return.  Your symptoms do not get better with treatment.  Your symptoms get  worse.  Your rash spreads.  You have a fever or chills.  You have new symptoms.  You have new warmth or redness of your skin. Summary  A skin yeast infection is a condition in which there is an overgrowth of yeast (candida) that normally lives on the skin. This condition is caused by a change in the normal balance of the yeast and bacteria that live on the skin.  Take or apply over-the-counter and prescription medicines only as told by your health care provider.  Keep your skin clean and dry.  Contact a health care provider if your symptoms do not get better with treatment. This information is not intended to replace advice given to you by your health care provider. Make sure you discuss any questions you have with your health care provider. Document Released: 08/18/2011 Document Revised: 04/19/2018 Document Reviewed: 04/19/2018 Elsevier Patient Education  [place]. Nystatin skin cream or ointment What is this medicine? NYSTATIN (nye STAT in) is an antifungal medicine. It is used to treat certain kinds of fungal or yeast infections of the skin. This medicine may be used for other purposes; ask your health care provider or pharmacist if you have questions. COMMON BRAND NAME(S): Mycostatin, Nystex, Pediaderm AF What should I tell my health care provider before I take this medicine? They need to know if you have any of these conditions:  an unusual or allergic reaction to nystatin, other foods, dyes or preservatives  pregnant or trying to get  pregnant  breast-feeding How should I use this medicine? This medicine is for external use on the skin only. Follow the directions on the prescription label. Wash hands before and after use. If treating a hand or nail infection, wash hands before use only. Apply a thin layer of this medicine to cover the affected skin and surrounding area. You can cover the area with a sterile gauze dressing (bandage). Do not use an airtight bandage  (such as a plastic-covered bandage). Do not get the medicine in your eyes. If you do, rinse out with plenty of cool tap water. Use the full course of treatment prescribed, even if you think the infection is getting better. Use at regular intervals. Do not use your medicine more often than directed. Do not use this medicine for any condition other than the one for which it was prescribed. Talk to your pediatrician regarding the use of this medicine in children. Special care may be needed. Overdosage: If you think you have taken too much of this medicine contact a poison control center or emergency room at once. NOTE: This medicine is only for you. Do not share this medicine with others. What if I miss a dose? If you miss a dose, use it as soon as you can. If it is almost time for your next dose, use only that dose. Do not use double or extra doses. What may interact with this medicine? Interactions are not expected. Do not use any other skin products on the affected area without telling your doctor or health care professional. This list may not describe all possible interactions. Give your health care provider a list of all the medicines, herbs, non-prescription drugs, or dietary supplements you use. Also tell them if you smoke, drink alcohol, or use illegal drugs. Some items may interact with your medicine. What should I watch for while using this medicine? Tell your doctor or health care professional if your symptoms do not improve after 3 days. After bathing make sure that your skin is very dry. Fungal infections like moist conditions. Do not walk around barefoot. To help prevent reinfection, wear freshly washed cotton, not synthetic, clothing. What side effects may I notice from receiving this medicine? Side effects that usually do not require medical attention (report to your doctor or health care professional if they continue or are bothersome):  skin irritation This list may not describe all  possible side effects. Call your doctor for medical advice about side effects. You may report side effects to FDA at 1-800-FDA-1088. Where should I keep my medicine? Keep out of the reach of children. Store at room temperature 15 to 30 degrees C (59 to 86 degrees F). Throw away any unused medicine after the expiration date. NOTE: This sheet is a summary. It may not cover all possible information. If you have questions about this medicine, talk to your doctor, pharmacist, or health care provider.  2020 Elsevier/Gold Standard (2016-01-01 16:28:30) Levofloxacin tablets What is this medicine? LEVOFLOXACIN (lee voe FLOX a sin) is a quinolone antibiotic. It is used to treat certain kinds of bacterial infections. It will not work for colds, flu, or other viral infections. This medicine may be used for other purposes; ask your health care provider or pharmacist if you have questions. COMMON BRAND NAME(S): Levaquin, Levaquin Leva-Pak What should I tell my health care provider before I take this medicine? They need to know if you have any of these conditions:  bone problems  diabetes  heart disease  high  blood pressure  history of irregular heartbeat  history of low levels of potassium in the blood  joint problems  kidney disease  liver disease  mental illness  myasthenia gravis  seizures  tendon problems  tingling of the fingers or toes, or other nerve disorder  an unusual or allergic reaction to levofloxacin, other quinolone antibiotics, foods, dyes, or preservatives  pregnant or trying to get pregnant  breast-feeding How should I use this medicine? Take this medicine by mouth with a full glass of water. Follow the directions on the prescription label. You can take it with or without food. If it upsets your stomach, take it with food. Take your medicine at regular intervals. Do not take your medicine more often than directed. Take all of your medicine as directed even if you  think you are better. Do not skip doses or stop your medicine early. Avoid antacids, calcium, iron, and zinc products for 2 hours before and 2 hours after taking a dose of this medicine. A special MedGuide will be given to you by the pharmacist with each prescription and refill. Be sure to read this information carefully each time. Talk to your pediatrician regarding the use of this medicine in children. While this drug may be prescribed for children as young as 6 months for selected conditions, precautions do apply. Overdosage: If you think you have taken too much of this medicine contact a poison control center or emergency room at once. NOTE: This medicine is only for you. Do not share this medicine with others. What if I miss a dose? If you miss a dose, take it as soon as you remember. If it is almost time for your next dose, take only that dose. Do not take double or extra doses. What may interact with this medicine? Do not take this medicine with any of the following medications:  cisapride  dronedarone  pimozide  thioridazine This medicine may also interact with the following medications:  antacids  birth control pills  certain medicines for diabetes, like glipizide, glyburide, or insulin  certain medicines that treat or prevent blood clots like warfarin  didanosine buffered tablets or powder  multivitamins  NSAIDS, medicines for pain and inflammation, like ibuprofen or naproxen  other medicines that prolong the QT interval (cause an abnormal heart rhythm) like dofetilide, ziprasidone  steroid medicines like prednisone or cortisone  sucralfate  theophylline This list may not describe all possible interactions. Give your health care provider a list of all the medicines, herbs, non-prescription drugs, or dietary supplements you use. Also tell them if you smoke, drink alcohol, or use illegal drugs. Some items may interact with your medicine. What should I watch for while  using this medicine? Tell your doctor or healthcare professional if your symptoms do not start to get better or if they get worse. Do not treat diarrhea with over the counter products. Contact your doctor if you have diarrhea that lasts more than 2 days or if it is severe and watery. Check with your doctor or health care professional if you get an attack of severe diarrhea, nausea and vomiting, or if you sweat a lot. The loss of too much body fluid can make it dangerous for you to take this medicine. This medicine may increase blood sugar. Ask your healthcare provider if changes in diet or medicines are needed if you have diabetes. You may get drowsy or dizzy. Do not drive, use machinery, or do anything that needs mental alertness until you know  how this medicine affects you. Do not sit or stand up quickly, especially if you are an older patient. This reduces the risk of dizzy or fainting spells. This medicine can make you more sensitive to the sun. Keep out of the sun. If you cannot avoid being in the sun, wear protective clothing and use sunscreen. Do not use sun lamps or tanning beds/booths. What side effects may I notice from receiving this medicine? Side effects that you should report to your doctor or health care professional as soon as possible:  allergic reactions like skin rash or hives, swelling of the face, lips, or tongue  anxious  bloody or watery diarrhea  breathing problems  confusion  depressed mood  fast, irregular heartbeat  fever  hallucination, loss of contact with reality  joint, muscle, or tendon pain or swelling  loss of memory  muscle weakness  pain, tingling, numbness in the hands or feet  seizures  signs and symptoms of aortic dissection such as sudden chest, stomach, or back pain  signs and symptoms of high blood sugar such as being more thirsty or hungry or having to urinate more than normal. You may also feel very tired or have blurry vision.   signs and symptoms of liver injury like dark yellow or brown urine; general ill feeling or flu-like symptoms; light-colored stools; loss of appetite; nausea; right upper belly pain; unusually weak or tired; yellowing of the eyes or skin  signs and symptoms of low blood sugar such as feeling anxious; confusion; dizziness; increased hunger; unusually weak or tired; sweating; shakiness; cold; irritable; headache; blurred vision; fast heartbeat; loss of consciousness; pale skin  suicidal thoughts or other mood changes  sunburn  unusually weak Side effects that usually do not require medical attention (report to your doctor or health care professional if they continue or are bothersome):  constipation  dry mouth  headache  nausea, vomiting  trouble sleeping This list may not describe all possible side effects. Call your doctor for medical advice about side effects. You may report side effects to FDA at 1-800-FDA-1088. Where should I keep my medicine? Keep out of the reach of children. Store at room temperature between 15 and 30 degrees C (59 and 86 degrees F). Keep in a tightly closed container. Throw away any unused medicine after the expiration date. NOTE: This sheet is a summary. It may not cover all possible information. If you have questions about this medicine, talk to your doctor, pharmacist, or health care provider.  2020 Elsevier/Gold Standard (2018-11-21 14:03:14) Urinary Tract Infection, Adult A urinary tract infection (UTI) is an infection of any part of the urinary tract. The urinary tract includes:  The kidneys.  The ureters.  The bladder.  The urethra. These organs make, store, and get rid of pee (urine) in the body. What are the causes? This is caused by germs (bacteria) in your genital area. These germs grow and cause swelling (inflammation) of your urinary tract. What increases the risk? You are more likely to develop this condition if:  You have a small, thin  tube (catheter) to drain pee.  You cannot control when you pee or poop (incontinence).  You are female, and: ? You use these methods to prevent pregnancy: ? A medicine that kills sperm (spermicide). ? A device that blocks sperm (diaphragm). ? You have low levels of a female hormone (estrogen). ? You are pregnant.  You have genes that add to your risk.  You are sexually active.  You  take antibiotic medicines.  You have trouble peeing because of: ? A prostate that is bigger than normal, if you are female. ? A blockage in the part of your body that drains pee from the bladder (urethra). ? A kidney stone. ? A nerve condition that affects your bladder (neurogenic bladder). ? Not getting enough to drink. ? Not peeing often enough.  You have other conditions, such as: ? Diabetes. ? A weak disease-fighting system (immune system). ? Sickle cell disease. ? Gout. ? Injury of the spine. What are the signs or symptoms? Symptoms of this condition include:  Needing to pee right away (urgently).  Peeing often.  Peeing small amounts often.  Pain or burning when peeing.  Blood in the pee.  Pee that smells bad or not like normal.  Trouble peeing.  Pee that is cloudy.  Fluid coming from the vagina, if you are female.  Pain in the belly or lower back. Other symptoms include:  Throwing up (vomiting).  No urge to eat.  Feeling mixed up (confused).  Being tired and grouchy (irritable).  A fever.  Watery poop (diarrhea). How is this treated? This condition may be treated with:  Antibiotic medicine.  Other medicines.  Drinking enough water. Follow these instructions at home:  Medicines  Take over-the-counter and prescription medicines only as told by your doctor.  If you were prescribed an antibiotic medicine, take it as told by your doctor. Do not stop taking it even if you start to feel better. General instructions  Make sure you: ? Pee until your bladder is  empty. ? Do not hold pee for a long time. ? Empty your bladder after sex. ? Wipe from front to back after pooping if you are a female. Use each tissue one time when you wipe.  Drink enough fluid to keep your pee pale yellow.  Keep all follow-up visits as told by your doctor. This is important. Contact a doctor if:  You do not get better after 1-2 days.  Your symptoms go away and then come back. Get help right away if:  You have very bad back pain.  You have very bad pain in your lower belly.  You have a fever.  You are sick to your stomach (nauseous).  You are throwing up. Summary  A urinary tract infection (UTI) is an infection of any part of the urinary tract.  This condition is caused by germs in your genital area.  There are many risk factors for a UTI. These include having a small, thin tube to drain pee and not being able to control when you pee or poop.  Treatment includes antibiotic medicines for germs.  Drink enough fluid to keep your pee pale yellow. This information is not intended to replace advice given to you by your health care provider. Make sure you discuss any questions you have with your health care provider. Document Released: 05/18/2008 Document Revised: 11/17/2018 Document Reviewed: 06/09/2018 Elsevier Patient Education  2020 [employer].

## 2019-11-18 LAB — URINE CULTURE

## 2019-11-20 NOTE — Progress Notes (Signed)
Complete Levaquin since you are allergic to penicillins.. You have a follow up appointment with Mar Daring, PA-C/ keep this appointment 12/21  - she can recheck your urinalysis at that time. If you continue to have persistent symptoms, I still recommend a vaginal exam as well and follow up

## 2019-11-23 ENCOUNTER — Ambulatory Visit: Payer: Self-pay | Admitting: Physician Assistant

## 2019-11-24 ENCOUNTER — Telehealth: Payer: Self-pay | Admitting: Adult Health

## 2019-11-24 ENCOUNTER — Telehealth: Payer: Self-pay | Admitting: Physician Assistant

## 2019-11-24 DIAGNOSIS — B373 Candidiasis of vulva and vagina: Secondary | ICD-10-CM

## 2019-11-24 DIAGNOSIS — B3731 Acute candidiasis of vulva and vagina: Secondary | ICD-10-CM

## 2019-11-24 MED ORDER — FLUCONAZOLE 150 MG PO TABS: ORAL_TABLET | ORAL | 0 refills | Status: DC

## 2019-11-24 NOTE — Telephone Encounter (Signed)
Pt had an appt with michelle on 11-16-2019 for uti and per pt was told if she needed diflucan callback and michelle will sent in [place] , [employer] employee pharm

## 2019-11-24 NOTE — Telephone Encounter (Signed)
Sharyn Lull, can patient have Diflucan.  Has been treated recently for UTI

## 2019-11-24 NOTE — Telephone Encounter (Signed)
Left message for patient letting her know that prescription was sent to pharmacy. KW

## 2019-11-24 NOTE — Telephone Encounter (Signed)
Diflucan per patient request sent.

## 2019-11-24 NOTE — Telephone Encounter (Signed)
Sent to pharmacy 

## 2019-11-24 NOTE — Telephone Encounter (Signed)
From [facility] 

## 2019-12-04 ENCOUNTER — Ambulatory Visit: Payer: Self-pay | Admitting: Physician Assistant

## 2020-01-19 ENCOUNTER — Other Ambulatory Visit: Payer: Self-pay | Admitting: Physician Assistant

## 2020-01-19 DIAGNOSIS — F419 Anxiety disorder, unspecified: Secondary | ICD-10-CM

## 2020-01-19 NOTE — Telephone Encounter (Signed)
Requested medication (s) are due for refill today -yes  Requested medication (s) are on the active medication list -yes  Future visit scheduled -yes  Last refill: 12/22/19  Notes to clinic: Patient is requesting refill of non delegated Rx.  Requested Prescriptions  Pending Prescriptions Disp Refills   ALPRAZolam (XANAX) 0.5 MG tablet [Pharmacy Med Name: ALPRAZolam 0.5 MG TABS 0.5 Tablet] 30 tablet 5    Sig: TAKE 1/2 TO 1 TABLET BY MOUTH BEFORE WORK AND 1 TABLET BY MOUTH EVERY 8 HOURS AS NEEDED      Not Delegated - Psychiatry:  Anxiolytics/Hypnotics Failed - 01/19/2020  3:12 PM      Failed - This refill cannot be delegated      Failed - Urine Drug Screen completed in last 360 days.      Passed - Valid encounter within last 6 months    Recent Outpatient Visits           2 months ago Urinary tract infection with hematuria, site unspecified   [place], Kelby Aline, FNP   3 months ago [place] M, PA-C   4 months ago Annual physical exam   [facility] Fenton Malling M, [place]   6 months ago [place], [place]   10 months ago Acute cystitis without hematuria   [facility] Fenton Malling M, [place]                  Requested Prescriptions  Pending Prescriptions Disp Refills   ALPRAZolam (XANAX) 0.5 MG tablet [Pharmacy Med Name: ALPRAZolam 0.5 MG TABS 0.5 Tablet] 30 tablet 5    Sig: TAKE 1/2 TO 1 TABLET BY MOUTH BEFORE WORK AND 1 TABLET BY MOUTH EVERY 8 HOURS AS NEEDED      Not Delegated - Psychiatry:  Anxiolytics/Hypnotics Failed - 01/19/2020  3:12 PM      Failed - This refill cannot be delegated      Failed - Urine Drug Screen completed in last 360 days.      Passed - Valid encounter within last 6 months    Recent Outpatient Visits           2 months ago Urinary tract infection with hematuria, site unspecified   [facility] [place], Kelby Aline, FNP   3 months ago [place], Wendee Beavers, PA-C   4 months ago Annual physical exam   [place], [place]   6 months ago [place], [place]   10 months ago Acute cystitis without hematuria   [facility], [place], [place]

## 2020-01-23 NOTE — Telephone Encounter (Signed)
error 

## 2020-04-09 DIAGNOSIS — M1611 Unilateral primary osteoarthritis, right hip: Secondary | ICD-10-CM | POA: Diagnosis not present

## 2020-04-09 DIAGNOSIS — Z96641 Presence of right artificial hip joint: Secondary | ICD-10-CM | POA: Diagnosis not present

## 2020-04-17 ENCOUNTER — Other Ambulatory Visit: Payer: Self-pay | Admitting: Physician Assistant

## 2020-04-17 DIAGNOSIS — E1122 Type 2 diabetes mellitus with diabetic chronic kidney disease: Secondary | ICD-10-CM

## 2020-04-17 DIAGNOSIS — N182 Chronic kidney disease, stage 2 (mild): Secondary | ICD-10-CM

## 2020-04-22 ENCOUNTER — Other Ambulatory Visit: Payer: Self-pay | Admitting: Physician Assistant

## 2020-04-22 DIAGNOSIS — N182 Chronic kidney disease, stage 2 (mild): Secondary | ICD-10-CM

## 2020-04-22 DIAGNOSIS — I10 Essential (primary) hypertension: Secondary | ICD-10-CM

## 2020-04-22 DIAGNOSIS — E1122 Type 2 diabetes mellitus with diabetic chronic kidney disease: Secondary | ICD-10-CM

## 2020-04-22 NOTE — Telephone Encounter (Signed)
Requested Prescriptions  Pending Prescriptions Disp Refills  . hydrochlorothiazide (HYDRODIURIL) 25 MG tablet [Pharmacy Med Name: HYDROCHLOROTHIAZIDE 25 MG T 25 Tablet] 90 tablet 1    Sig: TAKE 1 TABLET BY MOUTH DAILY.     Cardiovascular: Diuretics - Thiazide Failed - 04/22/2020 10:38 AM      Failed - Cr in normal range and within 360 days    Creatinine  Date Value Ref Range Status  09/06/2013 0.80 0.60 - 1.30 mg/dL Final   Creatinine, Ser  Date Value Ref Range Status  08/24/2019 1.33 (H) 0.57 - 1.00 mg/dL Final         Failed - Na in normal range and within 360 days    Sodium  Date Value Ref Range Status  08/24/2019 128 (L) 134 - 144 mmol/L Final  09/06/2013 135 (L) 136 - 145 mmol/L Final         Passed - Ca in normal range and within 360 days    Calcium  Date Value Ref Range Status  08/24/2019 9.5 8.7 - 10.3 mg/dL Final   Calcium, Total  Date Value Ref Range Status  09/06/2013 9.2 8.5 - 10.1 mg/dL Final         Passed - K in normal range and within 360 days    Potassium  Date Value Ref Range Status  08/24/2019 4.6 3.5 - 5.2 mmol/L Final  09/06/2013 4.4 3.5 - 5.1 mmol/L Final         Passed - Last BP in normal range    BP Readings from Last 1 Encounters:  11/16/19 136/80         Passed - Valid encounter within last 6 months    Recent Outpatient Visits          5 months ago Urinary tract infection with hematuria, site unspecified   [place], Kelby Aline, FNP   6 months ago [place], Wendee Beavers, [place]   8 months ago Annual physical exam   [facility] Fenton Malling M, [place]   9 months ago [place], [place], [place]   1 year ago Acute cystitis without hematuria   [facility], Jennifer M, [place]             . pioglitazone (ACTOS) 45 MG tablet [Pharmacy Med Name: PIOGLITAZONE HCL 45 MG TAB 45 Tablet] 90 tablet 1    Sig:  TAKE 1 TABLET BY MOUTH DAILY.     Endocrinology:  Diabetes - Glitazones - pioglitazone Failed - 04/22/2020 10:38 AM      Failed - HBA1C is between 0 and 7.9 and within 180 days    Hemoglobin A1C  Date Value Ref Range Status  09/06/2013 6.7 (H) 4.2 - 6.3 % Final    Comment:    The American Diabetes Association recommends that a primary goal of therapy should be <7% and that physicians should reevaluate the treatment regimen in patients with HbA1c values consistently >8%.    Hgb A1c MFr Bld  Date Value Ref Range Status  08/24/2019 12.6 (H) 4.8 - 5.6 % Final    Comment:             Prediabetes: 5.7 - 6.4          Diabetes: >6.4          Glycemic control for adults with diabetes: <7.0          Passed - Valid encounter  within last 6 months    Recent Outpatient Visits          5 months ago Urinary tract infection with hematuria, site unspecified   [facility] [place], Kelby Aline, FNP   6 months ago [place], Wendee Beavers, [place]   8 months ago Annual physical exam   [facility] Fenton Malling M, [place]   9 months ago [place], [place], [place]   1 year ago Acute cystitis without hematuria   [facility], [place], [place]

## 2020-06-13 ENCOUNTER — Other Ambulatory Visit: Payer: Self-pay | Admitting: Physician Assistant

## 2020-06-13 DIAGNOSIS — N182 Chronic kidney disease, stage 2 (mild): Secondary | ICD-10-CM

## 2020-06-13 DIAGNOSIS — E1122 Type 2 diabetes mellitus with diabetic chronic kidney disease: Secondary | ICD-10-CM

## 2020-06-13 NOTE — Telephone Encounter (Signed)
Requested medication (s) are due for refill today: Yes  Requested medication (s) are on the active medication list: Yes - Freestyle Lite  Last refill:  02/24/19  Future visit scheduled: No  Notes to clinic:  Prescription has expired.    Requested Prescriptions  Pending Prescriptions Disp Refills   FREESTYLE LITE test strip [Pharmacy Med Name: FREESTYLE LITE TEST STRIP Strip] 100 strip 12    Sig: [place] DAILY      Endocrinology: Diabetes - Testing Supplies Passed - 06/13/2020 10:29 AM      Passed - Valid encounter within last 12 months    Recent Outpatient Visits           7 months ago Urinary tract infection with hematuria, site unspecified   [facility] [place], Kelby Aline, FNP   8 months ago [place] Carles Collet M, [place]   9 months ago Annual physical exam   [facility] Fenton Malling M, [place]   11 months ago [place], [place], [place]   1 year ago Acute cystitis without hematuria   [facility], Anderson Malta M, PA-C                Lancets (FREESTYLE) lancets [employer] Med Name: FREESTYLE LANCETS Miscellaneous] 100 each 12    Sig: [place]      Endocrinology: Diabetes - Testing Supplies Passed - 06/13/2020 10:29 AM      Passed - Valid encounter within last 12 months    Recent Outpatient Visits           7 months ago Urinary tract infection with hematuria, site unspecified   [facility] Flinch[place], Kelby Aline, FNP   8 months ago [place], Fabio Bering M, [place]   9 months ago Annual physical exam   [facility] Fenton Malling M, [place]   11 months ago [place], [place], [place]   1 year ago Acute cystitis without hematuria   [facility], [place], [place]

## 2020-07-17 ENCOUNTER — Other Ambulatory Visit: Payer: Self-pay | Admitting: Physician Assistant

## 2020-07-17 DIAGNOSIS — F419 Anxiety disorder, unspecified: Secondary | ICD-10-CM

## 2020-07-17 NOTE — Telephone Encounter (Signed)
Patient needs appt to recheck A1c and f/u anxiety prior to more refills

## 2020-07-17 NOTE — Telephone Encounter (Signed)
Requested medication (s) are due for refill today: yes  Requested medication (s) are on the active medication list: yes  Last refill: 01/19/20  #30  5 refills  Future visit scheduled: No Called and left message to return call to office for appointment needed for medication refills.  Last OV 11/16/19.  Notes to clinic:  Med not delegated     Requested Prescriptions  Pending Prescriptions Disp Refills   ALPRAZolam (XANAX) 0.5 MG tablet [Pharmacy Med Name: ALPRAZolam 0.5 MG TABS 0.5 Tablet] 30 tablet 5    Sig: TAKE 1/2 TO 1 TABLET BY MOUTH BEFORE WORK AND 1 TABLET BY MOUTH EVERY 8 HOURS AS NEEDED      Not Delegated - Psychiatry:  Anxiolytics/Hypnotics Failed - 07/17/2020 11:38 AM      Failed - This refill cannot be delegated      Failed - Urine Drug Screen completed in last 360 days.      Failed - Valid encounter within last 6 months    Recent Outpatient Visits           8 months ago Urinary tract infection with hematuria, site unspecified   [facility] Flinchum, Kelby Aline, FNP   9 months ago [place] Carles Collet M, [place]   10 months ago Annual physical exam   [place], Clearnce Sorrel, [place]   1 year ago [place], [place]   1 year ago Acute cystitis without hematuria   [facility], [place], [place]

## 2020-07-19 ENCOUNTER — Other Ambulatory Visit: Payer: Self-pay | Admitting: Physician Assistant

## 2020-07-19 ENCOUNTER — Ambulatory Visit: Payer: 59 | Admitting: Physician Assistant

## 2020-07-19 ENCOUNTER — Encounter: Payer: Self-pay | Admitting: Physician Assistant

## 2020-07-19 ENCOUNTER — Other Ambulatory Visit: Payer: Self-pay

## 2020-07-19 VITALS — BP 150/88 | HR 78 | Temp 98.6°F | Resp 16 | Wt 235.4 lb

## 2020-07-19 DIAGNOSIS — E782 Mixed hyperlipidemia: Secondary | ICD-10-CM | POA: Diagnosis not present

## 2020-07-19 DIAGNOSIS — F418 Other specified anxiety disorders: Secondary | ICD-10-CM | POA: Diagnosis not present

## 2020-07-19 DIAGNOSIS — E1122 Type 2 diabetes mellitus with diabetic chronic kidney disease: Secondary | ICD-10-CM

## 2020-07-19 DIAGNOSIS — N182 Chronic kidney disease, stage 2 (mild): Secondary | ICD-10-CM | POA: Diagnosis not present

## 2020-07-19 DIAGNOSIS — F419 Anxiety disorder, unspecified: Secondary | ICD-10-CM | POA: Diagnosis not present

## 2020-07-19 DIAGNOSIS — Z23 Encounter for immunization: Secondary | ICD-10-CM | POA: Diagnosis not present

## 2020-07-19 DIAGNOSIS — L299 Pruritus, unspecified: Secondary | ICD-10-CM | POA: Diagnosis not present

## 2020-07-19 DIAGNOSIS — I10 Essential (primary) hypertension: Secondary | ICD-10-CM | POA: Diagnosis not present

## 2020-07-19 MED ORDER — HYDROXYZINE HCL 10 MG PO TABS: 10.0000 mg | ORAL_TABLET | Freq: Three times a day (TID) | ORAL | 1 refills | Status: DC | PRN

## 2020-07-19 MED ORDER — SERTRALINE HCL 100 MG PO TABS: 100.0000 mg | ORAL_TABLET | Freq: Every day | ORAL | 1 refills | Status: DC

## 2020-07-19 MED ORDER — GLIPIZIDE ER 10 MG PO TB24: 10.0000 mg | ORAL_TABLET | Freq: Every day | ORAL | 1 refills | Status: DC

## 2020-07-19 MED ORDER — HYDROCHLOROTHIAZIDE 25 MG PO TABS: 25.0000 mg | ORAL_TABLET | Freq: Every day | ORAL | 1 refills | Status: DC

## 2020-07-19 MED ORDER — METFORMIN HCL 1000 MG PO TABS: 1000.0000 mg | ORAL_TABLET | Freq: Two times a day (BID) | ORAL | 1 refills | Status: DC

## 2020-07-19 MED ORDER — SITAGLIPTIN PHOSPHATE 100 MG PO TABS: 100.0000 mg | ORAL_TABLET | Freq: Every day | ORAL | 1 refills | Status: DC

## 2020-07-19 MED ORDER — PIOGLITAZONE HCL 45 MG PO TABS: 45.0000 mg | ORAL_TABLET | Freq: Every day | ORAL | 1 refills | Status: DC

## 2020-07-19 MED ORDER — ALPRAZOLAM 0.5 MG PO TABS: 0.5000 mg | ORAL_TABLET | Freq: Two times a day (BID) | ORAL | 0 refills | Status: DC | PRN

## 2020-07-19 MED ORDER — SIMVASTATIN 20 MG PO TABS: 20.0000 mg | ORAL_TABLET | Freq: Every day | ORAL | 1 refills | Status: DC

## 2020-07-19 NOTE — Patient Instructions (Signed)

## 2020-07-19 NOTE — Progress Notes (Signed)
Established patient visit   Patient: [name]   DOB: July 06, 1956   64 y.o. Female  MRN: [id number] Visit Date: 07/19/2020  Today's healthcare provider: Mar Daring, PA-C   Chief Complaint  Patient presents with  . Follow-up   Subjective    HPI Reports that she is just here to have her medications refills.  Diabetes Mellitus Type II, Follow-up  Lab Results  Component Value Date   HGBA1C 12.6 (H) 08/24/2019   HGBA1C 7.9 (H) 03/24/2018   HGBA1C 8.8 02/24/2018   Wt Readings from Last 3 Encounters:  07/19/20 235 lb 6.4 oz (106.8 kg)  11/16/19 198 lb 12.8 oz (90.2 kg)  10/06/19 190 lb (86.2 kg)   Last seen for diabetes 11 months ago.  Management since then includes continue current medication plan. Januvia 156m and Metformin 10046mBID and Glipizide 1086mShe reports excellent compliance with treatment. She is not having side effects.  Symptoms: No fatigue No foot ulcerations  No appetite changes No nausea  No paresthesia of the feet  No polydipsia  No polyuria No visual disturbances   No vomiting     Home blood sugar records: not being checked "because her meter only works half way"   Current insulin regiment: none Most Recent Eye Exam: couple of years ago Current diet habits: in general, an "unhealthy" diet  Pertinent Labs: Lab Results  Component Value Date   CHOL 232 (H) 08/24/2019   HDL 38 (L) 08/24/2019   LDLCALC 139 (H) 08/24/2019   TRIG 302 (H) 08/24/2019   CHOLHDL 6.1 (H) 08/24/2019   Lab Results  Component Value Date   NA 128 (L) 08/24/2019   K 4.6 08/24/2019   CREATININE 1.33 (H) 08/24/2019   GFRNONAA 42 (L) 08/24/2019   GFRAA 49 (L) 08/24/2019   GLUCOSE 617 (HH)[place]9/09/2019     --------------------------------------------------------------------------------------------------- Hypertension, follow-up  BP Readings from Last 3 Encounters:  07/19/20 (!) 150/88  11/16/19 136/80  10/06/19 (!) 204/107   Wt Readings from Last 3  Encounters:  07/19/20 235 lb 6.4 oz (106.8 kg)  11/16/19 198 lb 12.8 oz (90.2 kg)  10/06/19 190 lb (86.2 kg)     She was last seen for hypertension 11 months ago.  BP at that visit was 127/72. Management since that visit includes Continue current medical treatment plan.  She reports excellent compliance with treatment. She is not having side effects.  She is following a Regular diet. She does not smoke.  Outside blood pressures are not being checked. Symptoms: No chest pain No chest pressure  No palpitations No syncope  No dyspnea No orthopnea  No paroxysmal nocturnal dyspnea No lower extremity edema   Pertinent labs: Lab Results  Component Value Date   CHOL 232 (H) 08/24/2019   HDL 38 (L) 08/24/2019   LDLCALC 139 (H) 08/24/2019   TRIG 302 (H) 08/24/2019   CHOLHDL 6.1 (H) 08/24/2019   Lab Results  Component Value Date   NA 128 (L) 08/24/2019   K 4.6 08/24/2019   CREATININE 1.33 (H) 08/24/2019   GFRNONAA 42 (L) 08/24/2019   GFRAA 49 (L) 08/24/2019   GLUCOSE 617 (HH)[place]9/09/2019     The 10-year ASCVD risk score (GoMikey Bussing Jr., et al., 2013) is: 23.1%   --------------------------------------------------------------------------------------------------- Patient Active Problem List   Diagnosis Date Noted  . Status post total replacement of hip 04/04/2018  . Primary osteoarthritis of right hip 02/20/2018  . Allergic rhinitis 10/28/2015  . Acute stress disorder  08/01/2015  . Anxiety 08/01/2015  . Clinical depression 08/01/2015  . Diabetes (HCC) 08/01/2015  . Diverticulosis of colon 08/01/2015  . Generalized pruritus 08/01/2015  . BP (high blood pressure) 08/01/2015  . Cannot sleep 08/01/2015  . Adiposity 08/01/2015  . Detrusor muscle hypertonia 08/01/2015  . Hypercholesterolemia without hypertriglyceridemia 08/01/2015  . Female stress incontinence 08/01/2015  . Avitaminosis D 08/01/2015   Past Medical History:  Diagnosis Date  . Anxiety   . Arthritis   . Cataract     left  . Depression   . Diabetes mellitus without complication (HCC)   . Environmental and seasonal allergies   . GERD (gastroesophageal reflux disease)   . Hyperlipidemia   . Hypertension   . Joint pain    as reported by patient  . Urinary incontinence    as stated by patient       Medications: Outpatient Medications Prior to Visit  Medication Sig  . ALPRAZolam (XANAX) 0.5 MG tablet TAKE 1/2 TO 1 TABLET BY MOUTH BEFORE WORK AND 1 TABLET BY MOUTH EVERY 8 HOURS AS NEEDED  . Blood Glucose Monitoring Suppl (ACCU-CHEK GUIDE) w/Device KIT 1 kit by Does not apply route 2 (two) times daily. To check blood sugar twice daily  . glipiZIDE (GLUCOTROL XL) 10 MG 24 hr tablet TAKE 1 TABLET BY MOUTH DAILY WITH BREAKFAST.  . glucose blood (FREESTYLE LITE) test strip CHECK BLOOD SUGAR TWICE DAILY  . hydrochlorothiazide (HYDRODIURIL) 25 MG tablet TAKE 1 TABLET BY MOUTH DAILY.  . hydrOXYzine (ATARAX/VISTARIL) 10 MG tablet Take 1 tablet (10 mg total) by mouth every 8 (eight) hours as needed for itching. Will cause drowsiness.  . Lancets (FREESTYLE) lancets CHECK BLOOD SUGAR TWICE DAILY  . losartan (COZAAR) 25 MG tablet Take 1 tablet (25 mg total) by mouth 2 (two) times daily.  . metFORMIN (GLUCOPHAGE) 1000 MG tablet Take 1 tablet (1,000 mg total) by mouth 2 (two) times daily with a meal.  . Multiple Vitamin (MULTIVITAMIN) tablet Take 1 tablet by mouth daily.  . pioglitazone (ACTOS) 45 MG tablet TAKE 1 TABLET BY MOUTH DAILY.  . sertraline (ZOLOFT) 100 MG tablet Take 1 tablet (100 mg total) by mouth daily.  . simvastatin (ZOCOR) 20 MG tablet Take 1 tablet (20 mg total) by mouth at bedtime.  . sitaGLIPtin (JANUVIA) 100 MG tablet Take 1 tablet (100 mg total) by mouth daily.  . albuterol (VENTOLIN HFA) 108 (90 Base) MCG/ACT inhaler Inhale 2 puffs into the lungs every 6 (six) hours as needed for wheezing or shortness of breath.  . erythromycin ophthalmic ointment Place a 1/2 inch ribbon of ointment into the  lower eyelid QID x 5 days. (Patient not taking: Reported on 11/16/2019)  . fluconazole (DIFLUCAN) 150 MG tablet Take 1 pill on day 1, then second pill three days later if still symptomatic.  . levofloxacin (LEVAQUIN) 500 MG tablet Take 1 tablet (500 mg total) by mouth daily.  . meclizine (ANTIVERT) 25 MG tablet Take 1 tablet (25 mg total) by mouth 3 (three) times daily as needed for dizziness. (Patient not taking: Reported on 11/16/2019)  . nystatin cream (MYCOSTATIN) Apply 1 application topically 2 (two) times daily.   No facility-administered medications prior to visit.    Review of Systems  Constitutional: Negative for appetite change, chills, fatigue and fever.  Respiratory: Negative for chest tightness and shortness of breath.   Cardiovascular: Negative for chest pain and palpitations.  Gastrointestinal: Negative for abdominal pain, nausea and vomiting.  Endocrine: Negative for polydipsia,   polyphagia and polyuria.  Neurological: Negative for dizziness and weakness.    Last CBC Lab Results  Component Value Date   WBC 7.5 08/24/2019   HGB 14.5 08/24/2019   HCT 45.4 08/24/2019   MCV 88 08/24/2019   MCH 28.0 08/24/2019   RDW 12.0 08/24/2019   PLT 286 08/24/2019   Last metabolic panel Lab Results  Component Value Date   GLUCOSE 617 (HH) 08/24/2019   NA 128 (L) 08/24/2019   K 4.6 08/24/2019   CL 85 (L) 08/24/2019   CO2 25 08/24/2019   BUN 23 08/24/2019   CREATININE 1.33 (H) 08/24/2019   GFRNONAA 42 (L) 08/24/2019   GFRAA 49 (L) 08/24/2019   CALCIUM 9.5 08/24/2019   PROT 8.0 08/24/2019   ALBUMIN 4.3 08/24/2019   LABGLOB 3.7 08/24/2019   AGRATIO 1.2 08/24/2019   BILITOT 0.5 08/24/2019   ALKPHOS 69 08/24/2019   AST 29 08/24/2019   ALT 32 08/24/2019   ANIONGAP 12 03/23/2018   Last lipids Lab Results  Component Value Date   CHOL 232 (H) 08/24/2019   HDL 38 (L) 08/24/2019   LDLCALC 139 (H) 08/24/2019   TRIG 302 (H) 08/24/2019   CHOLHDL 6.1 (H) 08/24/2019   Last  hemoglobin A1c Lab Results  Component Value Date   HGBA1C 12.6 (H) 08/24/2019      Objective    BP (!) 150/88 (BP Location: Left Arm, Patient Position: Sitting, Cuff Size: Large)   Pulse 78   Temp 98.6 F (37 C) (Oral)   Resp 16   Wt 235 lb 6.4 oz (106.8 kg)   BMI 36.87 kg/m  BP Readings from Last 3 Encounters:  07/19/20 (!) 150/88  11/16/19 136/80  10/06/19 (!) 204/107   Wt Readings from Last 3 Encounters:  07/19/20 235 lb 6.4 oz (106.8 kg)  11/16/19 198 lb 12.8 oz (90.2 kg)  10/06/19 190 lb (86.2 kg)      Physical Exam Vitals reviewed.  Constitutional:      General: She is not in acute distress.    Appearance: Normal appearance. She is well-developed. She is obese. She is not ill-appearing or diaphoretic.  Cardiovascular:     Rate and Rhythm: Normal rate and regular rhythm.     Heart sounds: Normal heart sounds. No murmur heard.  No friction rub. No gallop.   Pulmonary:     Effort: Pulmonary effort is normal. No respiratory distress.     Breath sounds: Normal breath sounds. No wheezing or rales.  Musculoskeletal:     Cervical back: Normal range of motion and neck supple.  Neurological:     Mental Status: She is alert.       No results found for any visits on 07/19/20.  Assessment & Plan     1. Type 2 diabetes mellitus with stage 2 chronic kidney disease, without long-term current use of insulin (HCC) Patient declined a1c today as she states she has her CPE in one month and will get her labs then. Suspect A1c to be very uncontrolled as she is reporting fasting sugars in the 200s. Medications refilled as below. Will see her next month for CPE and labs.  - sitaGLIPtin (JANUVIA) 100 MG tablet; Take 1 tablet (100 mg total) by mouth daily.  Dispense: 90 tablet; Refill: 1 - simvastatin (ZOCOR) 20 MG tablet; Take 1 tablet (20 mg total) by mouth at bedtime.  Dispense: 90 tablet; Refill: 1 - pioglitazone (ACTOS) 45 MG tablet; Take 1 tablet (45 mg total) by mouth daily.     Dispense: 90 tablet; Refill: 1 - metFORMIN (GLUCOPHAGE) 1000 MG tablet; Take 1 tablet (1,000 mg total) by mouth 2 (two) times daily with a meal.  Dispense: 180 tablet; Refill: 1 - glipiZIDE (GLUCOTROL XL) 10 MG 24 hr tablet; Take 1 tablet (10 mg total) by mouth daily with breakfast.  Dispense: 90 tablet; Refill: 1  2. Mixed hyperlipidemia Stable. Diagnosis pulled for medication refill. Continue current medical treatment plan. - simvastatin (ZOCOR) 20 MG tablet; Take 1 tablet (20 mg total) by mouth at bedtime.  Dispense: 90 tablet; Refill: 1  3. Situational anxiety Stable. Diagnosis pulled for medication refill. Continue current medical treatment plan. - sertraline (ZOLOFT) 100 MG tablet; Take 1 tablet (100 mg total) by mouth daily.  Dispense: 90 tablet; Refill: 1  4. Itching Stable. Diagnosis pulled for medication refill. Continue current medical treatment plan. - hydrOXYzine (ATARAX/VISTARIL) 10 MG tablet; Take 1 tablet (10 mg total) by mouth every 8 (eight) hours as needed for itching. Will cause drowsiness.  Dispense: 30 tablet; Refill: 1  5. Essential hypertension Elevated today. Diagnosis pulled for medication refill. Continue current medical treatment plan. - hydrochlorothiazide (HYDRODIURIL) 25 MG tablet; Take 1 tablet (25 mg total) by mouth daily.  Dispense: 90 tablet; Refill: 1  6. Acute anxiety Stable. Diagnosis pulled for medication refill. Continue current medical treatment plan. - ALPRAZolam (XANAX) 0.5 MG tablet; Take 1 tablet (0.5 mg total) by mouth 2 (two) times daily as needed for anxiety.  Dispense: 30 tablet; Refill: 0  7. Need for shingles vaccine Shingrix Vaccine #1 given to patient without complications. Patient sat for 15 minutes after administration and was tolerated well without adverse effects. Will get #2 in 2-6 months.  - Varicella-zoster vaccine IM (Shingrix)   No follow-ups on file.      Reynolds Bowl, PA-C, have reviewed all documentation for  this visit. The documentation on 07/21/20 for the exam, diagnosis, procedures, and orders are all accurate and complete.   Rubye Beach  [facility] [phone] (phone) [phone] (fax)  [place]

## 2020-08-09 ENCOUNTER — Ambulatory Visit: Payer: Self-pay

## 2020-08-09 ENCOUNTER — Other Ambulatory Visit: Payer: Self-pay | Admitting: Physician Assistant

## 2020-08-09 MED ORDER — GLIPIZIDE ER 5 MG PO TB24: 5.0000 mg | ORAL_TABLET | Freq: Every day | ORAL | 1 refills | Status: DC

## 2020-08-09 NOTE — Telephone Encounter (Signed)
Please advise 

## 2020-08-09 NOTE — Telephone Encounter (Signed)
Patient advised as directed below. 

## 2020-08-09 NOTE — Telephone Encounter (Signed)
Patient called stating that she has been having hypoglycemic issues Wednesday her BS dropped to 54 and she was symptomatic sweaty with shakes. She took glucose tabs and ate a cracker.  She states that yesterday her BS dropped to 78 and a second check was 78.  She states she did not take her metformin last night and this am her BS was 150.  She called requesting advice and adjustment of medication.  She states she has been bad with diet in the past and has now been strict with what she eats.  Care advice was reviewed with patient. Call per protocol ws placed to office. Per office I will route note for evaluation and advice for patient.  Reason for Disposition . [1] Evening (after bedtime snack) blood glucose < 100 mg/dL (5.6 mmol/L) AND [2] more than once in past week  Answer Assessment - Initial Assessment Questions 1. SYMPTOMS: "What symptoms are you concerned about?"     hypoglycemia 2. ONSET:  "When did the symptoms start?"     Last month 3. BLOOD GLUCOSE: "What is your blood glucose level?"      150 today but skipped night time medication was 78 before bed 4. USUAL RANGE: "What is your blood glucose level usually?" (e.g., usual fasting morning value, usual evening value)     90 5. TYPE 1 or 2:  "Do you know what type of diabetes you have?"  (e.g., Type 1, Type 2, Gestational; doesn't know)      Type 2 6. INSULIN: "Do you take insulin?" "What type of insulin(s) do you use? What is the mode of delivery? (syringe, pen; injection or pump) "When did you last give yourself an insulin dose?" (i.e., time or hours/minutes ago) "How much did you give?" (i.e., how many units)    None 7. DIABETES PILLS: "Do you take any pills for your diabetes?"    Metformin januvia glipizide, actos 8. OTHER SYMPTOMS: "Do you have any symptoms?" (e.g., fever, frequent urination, difficulty breathing, vomiting)    Sweaty and jumpy urine smell 9. LOW BLOOD GLUCOSE TREATMENT: "What have you done so far to treat the low  blood glucose level?"    Sugar pills or cookie or OJ 10. FOOD: "When did you last eat or drink?"       breakfast 11. ALONE: "Are you alone right now or is someone with you?"        At work 12. PREGNANCY: "Is there any chance you are pregnant?" "When was your last menstrual period?"       N/A  Protocols used: DIABETES - LOW BLOOD SUGAR-A-AH

## 2020-08-09 NOTE — Addendum Note (Signed)
Addended by: Mar Daring on: 08/09/2020 12:22 PM   Modules accepted: Orders

## 2020-08-09 NOTE — Telephone Encounter (Signed)
Her last A1c had been very uncontrolled at 12.6.   We can decrease her glipizide to 5mg .

## 2020-08-23 ENCOUNTER — Telehealth: Payer: Self-pay

## 2020-08-23 NOTE — Telephone Encounter (Signed)
Copied from [place] [phone]. Topic: General - Other >> Aug 23, 2020 11:16 AM Celene Kras wrote: Reason for CRM: Pt called and is requesting to know when she should receive her next shingles vaccine. Please leave a message if she cant be reached. Please advise.

## 2020-08-23 NOTE — Telephone Encounter (Signed)
Patient scheduled.

## 2020-08-26 ENCOUNTER — Telehealth: Payer: Self-pay | Admitting: Physician Assistant

## 2020-08-26 ENCOUNTER — Ambulatory Visit (INDEPENDENT_AMBULATORY_CARE_PROVIDER_SITE_OTHER): Payer: 59 | Admitting: Family Medicine

## 2020-08-26 ENCOUNTER — Encounter: Payer: Self-pay | Admitting: Family Medicine

## 2020-08-26 ENCOUNTER — Telehealth: Payer: Self-pay

## 2020-08-26 DIAGNOSIS — Z20822 Contact with and (suspected) exposure to covid-19: Secondary | ICD-10-CM | POA: Diagnosis not present

## 2020-08-26 DIAGNOSIS — N182 Chronic kidney disease, stage 2 (mild): Secondary | ICD-10-CM

## 2020-08-26 DIAGNOSIS — E1122 Type 2 diabetes mellitus with diabetic chronic kidney disease: Secondary | ICD-10-CM

## 2020-08-26 NOTE — Progress Notes (Signed)
Established patient visit   Patient: [name]   DOB: [birth year]   64 y.o. Female  MRN: [id number] Visit Date: 08/26/2020  Today's healthcare provider: Vernie Murders, PA   No chief complaint on file.  Subjective    HPI   Covid symptoms that started for a couple days with last night getting worse. Cough with sinus pain, some sneezing and chills. Unable to check temperature at home. 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 She has been tested for Covid but won't have the results for a few days.  Past Medical History:  Diagnosis Date  . Anxiety   . Arthritis   . Cataract    left  . Depression   . Diabetes mellitus without complication ([place])   . Environmental and seasonal allergies   . GERD (gastroesophageal reflux disease)   . Hyperlipidemia   . Hypertension   . Joint pain    as reported by patient  . Urinary incontinence    as stated by patient   Past Surgical History:  Procedure Laterality Date  . BREAST EXCISIONAL BIOPSY Right 1999   neg  . BREAST LUMPECTOMY Right 2005   as reported by patient  . CATARACT EXTRACTION  January 2013  . CATARACT EXTRACTION W/PHACO Left 12/02/2016   Procedure: CATARACT EXTRACTION PHACO AND INTRAOCULAR LENS PLACEMENT (IOC);  Surgeon: Estill Cotta, MD;  Location: [facility] ORS;  Service: Ophthalmology;  Laterality: Left;  [place] 2:14 AP% 28.4 CDE 59.73 Fluid pack lot # [id number]  . DIAGNOSTIC LAPAROSCOPY    . TOTAL HIP ARTHROPLASTY Right 04/04/2018   Procedure: TOTAL HIP ARTHROPLASTY;  Surgeon: Dereck Leep, MD;  Location: [facility] ORS;  Service: Orthopedics;  Laterality: Right;   Family History  Problem Relation Age of Onset  . Healthy Brother   . Aneurysm Mother        brain  . Diabetes Father   . CAD Father   . Heart failure Father   . Colon cancer Father   . Cancer  Father   . Alzheimer's disease Paternal Grandmother   . Dementia Paternal Grandmother   . Mental illness Paternal Grandfather   . Healthy Daughter   . Healthy Son   . Breast cancer Neg Hx    Allergies  Allergen Reactions  . Jardiance [Empagliflozin] Itching    Recurrent mycotic infections  . Penicillins Rash and Other (See Comments)    Has patient had a PCN reaction causing immediate rash, facial/tongue/throat swelling, SOB or lightheadedness with hypotension: No Has patient had a PCN reaction causing severe rash involving mucus membranes or skin necrosis: No Has patient had a PCN reaction that required hospitalization No Has patient had a PCN reaction occurring within the last 10 years: No If all of the above answers are "NO", then may proceed with Cephalosporin use.      Medications: Outpatient Medications Prior to Visit  Medication Sig  . ALPRAZolam (XANAX) 0.5 MG tablet Take 1 tablet (0.5 mg total) by mouth 2 (two) times daily as needed for anxiety.  . Blood Glucose Monitoring Suppl (ACCU-CHEK GUIDE) w/Device KIT 1 kit by Does not apply route 2 (two) times daily. To check blood sugar twice daily  . glipiZIDE (GLUCOTROL XL) 5 MG 24 hr tablet Take 1 tablet (5 mg total) by mouth daily with breakfast.  . glucose blood (  FREESTYLE LITE) test strip CHECK BLOOD SUGAR TWICE DAILY  . hydrochlorothiazide (HYDRODIURIL) 25 MG tablet Take 1 tablet (25 mg total) by mouth daily.  . hydrOXYzine (ATARAX/VISTARIL) 10 MG tablet Take 1 tablet (10 mg total) by mouth every 8 (eight) hours as needed for itching. Will cause drowsiness.  . Lancets (FREESTYLE) lancets CHECK BLOOD SUGAR TWICE DAILY  . losartan (COZAAR) 25 MG tablet Take 1 tablet (25 mg total) by mouth 2 (two) times daily.  . metFORMIN (GLUCOPHAGE) 1000 MG tablet Take 1 tablet (1,000 mg total) by mouth 2 (two) times daily with a meal.  . Multiple Vitamin (MULTIVITAMIN) tablet Take 1 tablet by mouth daily.  . pioglitazone (ACTOS) 45 MG  tablet Take 1 tablet (45 mg total) by mouth daily.  . sertraline (ZOLOFT) 100 MG tablet Take 1 tablet (100 mg total) by mouth daily.  . simvastatin (ZOCOR) 20 MG tablet Take 1 tablet (20 mg total) by mouth at bedtime.  . sitaGLIPtin (JANUVIA) 100 MG tablet Take 1 tablet (100 mg total) by mouth daily.  . [DISCONTINUED] meclizine (ANTIVERT) 25 MG tablet Take 1 tablet (25 mg total) by mouth 3 (three) times daily as needed for dizziness. (Patient not taking: Reported on 11/16/2019)   No facility-administered medications prior to visit.    Review of Systems  Constitutional: Positive for chills, diaphoresis and fatigue. Negative for fever (Unknown).  HENT: Positive for congestion, postnasal drip, rhinorrhea, sinus pressure, sinus pain, sneezing, sore throat and trouble swallowing. Negative for ear pain and facial swelling.   Eyes: Negative for pain, discharge, redness, itching and visual disturbance.  Respiratory: Positive for cough and shortness of breath (with exertion). Negative for chest tightness and wheezing.   Gastrointestinal: Positive for diarrhea and nausea. Negative for abdominal pain and vomiting.  Musculoskeletal: Negative for myalgias.      Objective    There were no vitals taken for this visit.   Physical Exam: No apparent acute respiratory distress during telephonic interview.  No results found for any visits on 08/26/20.  Assessment & Plan     1. Suspected COVID-19 virus infection Developed cough, chills, shortness of breath, rhinorrhea, sore throat and fatigue over the past couple days. States she could not take her temperature since did not have a thermometer and has been very concerned because an unvaccinated co-worker In her area died recently from [place]. {This patient has had her vaccination more than 3-4 months ago.) Recommend evaluation for infusion therapy and she got a COVID test started today. She will remain quarantined at home.  2. Type 2 diabetes mellitus with  stage 2 chronic kidney disease, without long-term current use of insulin (HCC) Questionable control on Januvia 100 mg qd, Zocor 20 mg hs, Actos 45 mg qd, Metformin 1000 mg BID and Glipizide 10 mg with breakfast. Denies polyuria, polydipsia or polyphagia. Last Hgb A1C on 08-24-19 was 12.6% with Creatinine 1.33 and GFR 42.   No follow-ups on file.      Andres Shad, PA, have reviewed all documentation for this visit. The documentation on 08/26/20 for the exam, diagnosis, procedures, and orders are all accurate and complete.    Vernie Murders, [place] [phone] (phone) [phone] (fax)  [place]

## 2020-08-26 NOTE — Telephone Encounter (Signed)
Copied from [place] [id number]. Topic: Quick Communication - See Telephone Encounter >> Aug 26, 2020 10:55 AM Loma [place] wrote: CRM for notification. 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   [phone]

## 2020-08-26 NOTE — Telephone Encounter (Signed)
FYI

## 2020-09-16 ENCOUNTER — Other Ambulatory Visit: Payer: Self-pay | Admitting: Physician Assistant

## 2020-09-16 DIAGNOSIS — F419 Anxiety disorder, unspecified: Secondary | ICD-10-CM

## 2020-09-16 NOTE — Telephone Encounter (Signed)
Requested medication (s) are due for refill today: yes   Requested medication (s) are on the active medication list: yes   Last refill:  07/19/20 #30 0 refills   Future visit scheduled: yes in 4 days   Notes to clinic:  not delegated per protocol     Requested Prescriptions  Pending Prescriptions Disp Refills   ALPRAZolam (XANAX) 0.5 MG tablet [Pharmacy Med Name: ALPRAZolam 0.5 MG TABS 0.5 Tablet] 30 tablet 0    Sig: TAKE 1 TABLET BY MOUTH 2 TIMES DAILY AS NEEDED FOR ANXIETY.      Not Delegated - Psychiatry:  Anxiolytics/Hypnotics Failed - 09/16/2020 11:27 AM      Failed - This refill cannot be delegated      Failed - Urine Drug Screen completed in last 360 days.      Passed - Valid encounter within last 6 months    Recent Outpatient Visits           3 weeks ago Suspected COVID-19 virus infection   [place], [place], [place]   1 month ago Type 2 diabetes mellitus with stage 2 chronic kidney disease, without long-term current use of insulin [facility])   [place], [place]   10 months ago Urinary tract infection with hematuria, site unspecified   [facility], Kelby Aline, FNP   11 months ago [place], Wendee Beavers, PA-C   1 year ago Annual physical exam   [place], Clearnce Sorrel, PA-C       Future Appointments             In 4 days Marlyn Corporal, Clearnce Sorrel, PA-C [employer], [place]

## 2020-09-17 ENCOUNTER — Other Ambulatory Visit: Payer: Self-pay | Admitting: Physician Assistant

## 2020-09-17 DIAGNOSIS — Z1231 Encounter for screening mammogram for malignant neoplasm of breast: Secondary | ICD-10-CM

## 2020-09-20 ENCOUNTER — Other Ambulatory Visit: Payer: Self-pay

## 2020-09-20 ENCOUNTER — Encounter: Payer: Self-pay | Admitting: Physician Assistant

## 2020-09-20 ENCOUNTER — Ambulatory Visit (INDEPENDENT_AMBULATORY_CARE_PROVIDER_SITE_OTHER): Payer: 59 | Admitting: Physician Assistant

## 2020-09-20 VITALS — BP 148/74 | HR 65 | Temp 98.0°F | Ht 67.0 in | Wt 221.0 lb

## 2020-09-20 DIAGNOSIS — Z6834 Body mass index (BMI) 34.0-34.9, adult: Secondary | ICD-10-CM

## 2020-09-20 DIAGNOSIS — E1122 Type 2 diabetes mellitus with diabetic chronic kidney disease: Secondary | ICD-10-CM

## 2020-09-20 DIAGNOSIS — E559 Vitamin D deficiency, unspecified: Secondary | ICD-10-CM | POA: Diagnosis not present

## 2020-09-20 DIAGNOSIS — Z0000 Encounter for general adult medical examination without abnormal findings: Secondary | ICD-10-CM

## 2020-09-20 DIAGNOSIS — N182 Chronic kidney disease, stage 2 (mild): Secondary | ICD-10-CM | POA: Diagnosis not present

## 2020-09-20 DIAGNOSIS — G2581 Restless legs syndrome: Secondary | ICD-10-CM

## 2020-09-20 DIAGNOSIS — F3342 Major depressive disorder, recurrent, in full remission: Secondary | ICD-10-CM

## 2020-09-20 DIAGNOSIS — E7800 Pure hypercholesterolemia, unspecified: Secondary | ICD-10-CM | POA: Diagnosis not present

## 2020-09-20 DIAGNOSIS — I10 Essential (primary) hypertension: Secondary | ICD-10-CM | POA: Diagnosis not present

## 2020-09-20 DIAGNOSIS — E6609 Other obesity due to excess calories: Secondary | ICD-10-CM | POA: Diagnosis not present

## 2020-09-20 DIAGNOSIS — F3341 Major depressive disorder, recurrent, in partial remission: Secondary | ICD-10-CM | POA: Insufficient documentation

## 2020-09-20 MED ORDER — ROPINIROLE HCL 1 MG PO TABS: 1.0000 mg | ORAL_TABLET | Freq: Every day | ORAL | 1 refills | Status: DC

## 2020-09-20 NOTE — Patient Instructions (Addendum)
Preventive Care 40-66 Years Old, Female Preventive care refers to visits with your health care provider and lifestyle choices that can promote health and wellness. This includes:  A yearly physical exam. This may also be called an annual well check.  Regular dental visits and eye exams.  Immunizations.  Screening for certain conditions.  Healthy lifestyle choices, such as eating a healthy diet, getting regular exercise, not using drugs or products that contain nicotine and tobacco, and limiting alcohol use. What can I expect for my preventive care visit? Physical exam Your health care provider will check your:  Height and weight. This may be used to calculate body mass index (BMI), which tells if you are at a healthy weight.  Heart rate and blood pressure.  Skin for abnormal spots. Counseling Your health care provider may ask you questions about your:  Alcohol, tobacco, and drug use.  Emotional well-being.  Home and relationship well-being.  Sexual activity.  Eating habits.  Work and work environment.  Method of birth control.  Menstrual cycle.  Pregnancy history. What immunizations do I need?  Influenza (flu) vaccine  This is recommended every year. Tetanus, diphtheria, and pertussis (Tdap) vaccine  You may need a Td booster every 10 years. Varicella (chickenpox) vaccine  You may need this if you have not been vaccinated. Zoster (shingles) vaccine  You may need this after age 60. Measles, mumps, and rubella (MMR) vaccine  You may need at least one dose of MMR if you were born in 1957 or later. You may also need a second dose. Pneumococcal conjugate (PCV13) vaccine  You may need this if you have certain conditions and were not previously vaccinated. Pneumococcal polysaccharide (PPSV23) vaccine  You may need one or two doses if you smoke cigarettes or if you have certain conditions. Meningococcal conjugate (MenACWY) vaccine  You may need this if you  have certain conditions. Hepatitis A vaccine  You may need this if you have certain conditions or if you travel or work in places where you may be exposed to hepatitis A. Hepatitis B vaccine  You may need this if you have certain conditions or if you travel or work in places where you may be exposed to hepatitis B. Haemophilus influenzae type b (Hib) vaccine  You may need this if you have certain conditions. Human papillomavirus (HPV) vaccine  If recommended by your health care provider, you may need three doses over 6 months. You may receive vaccines as individual doses or as more than one vaccine together in one shot (combination vaccines). Talk with your health care provider about the risks and benefits of combination vaccines. What tests do I need? Blood tests  Lipid and cholesterol levels. These may be checked every 5 years, or more frequently if you are over 50 years old.  Hepatitis C test.  Hepatitis B test. Screening  Lung cancer screening. You may have this screening every year starting at age 55 if you have a 30-pack-year history of smoking and currently smoke or have quit within the past 15 years.  Colorectal cancer screening. All adults should have this screening starting at age 50 and continuing until age 75. Your health care provider may recommend screening at age 45 if you are at increased risk. You will have tests every 1-10 years, depending on your results and the type of screening test.  Diabetes screening. This is done by checking your blood sugar (glucose) after you have not eaten for a while (fasting). You may have this   done every 1-3 years.  Mammogram. This may be done every 1-2 years. Talk with your health care provider about when you should start having regular mammograms. This may depend on whether you have a family history of breast cancer.  BRCA-related cancer screening. This may be done if you have a family history of breast, ovarian, tubal, or peritoneal  cancers.  Pelvic exam and Pap test. This may be done every 3 years starting at age 22. Starting at age 57, this may be done every 5 years if you have a Pap test in combination with an HPV test. Other tests  Sexually transmitted disease (STD) testing.  Bone density scan. This is done to screen for osteoporosis. You may have this scan if you are at high risk for osteoporosis. Follow these instructions at home: Eating and drinking  Eat a diet that includes fresh fruits and vegetables, whole grains, lean protein, and low-fat dairy.  Take vitamin and mineral supplements as recommended by your health care provider.  Do not drink alcohol if: ? Your health care provider tells you not to drink. ? You are pregnant, may be pregnant, or are planning to become pregnant.  If you drink alcohol: ? Limit how much you have to 0-1 drink a day. ? Be aware of how much alcohol is in your drink. In the [place] one drink equals one 12 oz bottle of beer (355 mL), one 5 oz glass of wine (148 mL), or one 1 oz glass of hard liquor (44 mL). Lifestyle  Take daily care of your teeth and gums.  Stay active. Exercise for at least 30 minutes on 5 or more days each week.  Do not use any products that contain nicotine or tobacco, such as cigarettes, e-cigarettes, and chewing tobacco. If you need help quitting, ask your health care provider.  If you are sexually active, practice safe sex. Use a condom or other form of birth control (contraception) in order to prevent pregnancy and STIs (sexually transmitted infections).  If told by your health care provider, take low-dose aspirin daily starting at age 65. What's next?  Visit your health care provider once a year for a well check visit.  Ask your health care provider how often you should have your eyes and teeth checked.  Stay up to date on all vaccines. This information is not intended to replace advice given to you by your health care provider. Make sure you  discuss any questions you have with your health care provider. Document Revised: 08/11/2018 Document Reviewed: 08/11/2018 Elsevier Patient Education  [place].   Ropinirole tablets What is this medicine? ROPINIROLE (roe PIN i role) is used to treat the symptoms of Parkinson's disease. It helps to improve muscle control and movement difficulties. It is also used for the treatment of Restless Legs Syndrome. This medicine may be used for other purposes; ask your health care provider or pharmacist if you have questions. COMMON BRAND NAME(S): Requip What should I tell my health care provider before I take this medicine? They need to know if you have any of these conditions:  heart disease  high blood pressure  kidney disease  liver disease  low blood pressure  narcolepsy  sleep apnea  an unusual or allergic reaction to ropinirole, other medicines, foods, dyes, or preservatives  pregnant or trying to get pregnant  breast-feeding How should I use this medicine? Take this medicine by mouth with a glass of water. Follow the directions on the prescription label. You  can take it with or without food. If it upsets your stomach, take it with food. Take your doses at regular intervals. Do not take your medicine more often than directed. Do not stop taking this medicine except on your doctor's advice. Stopping this medicine too quickly may cause serious side effects. Talk to your pediatrician regarding the use of this medicine in children. Special care may be needed. Overdosage: If you think you have taken too much of this medicine contact a poison control center or emergency room at once. NOTE: This medicine is only for you. Do not share this medicine with others. What if I miss a dose? If you miss a dose, take it as soon as you can. If it is almost time for your next dose, take only that dose. Do not take double or extra doses. What may interact with this medicine?  certain  medicines for depression, mood, or psychotic disorders  ciprofloxacin  female hormones, like estrogens and birth control pills  fluvoxamine  metoclopramide  mexiletine  norfloxacin  omeprazole  rifampin This list may not describe all possible interactions. Give your health care provider a list of all the medicines, herbs, non-prescription drugs, or dietary supplements you use. Also tell them if you smoke, drink alcohol, or use illegal drugs. Some items may interact with your medicine. What should I watch for while using this medicine? Visit your health care professional for regular checks on your progress. Tell your health care professional if your symptoms do not start to get better or if they get worse. Do not stop taking except on your health care professional's advice. You may develop a severe reaction. Your health care professional will tell you how much medicine to take. You may get drowsy or dizzy. Do not drive, use machinery, or do anything that needs mental alertness until you know how this drug affects you. Do not stand or sit up quickly, especially if you are an older patient. This reduces the risk of dizzy or fainting spells. Alcohol may interfere with the effect of this medicine. Avoid alcoholic drinks. When taking this medicine, you may fall asleep without notice. You may be doing activities like driving a car, talking, or eating. You may not feel drowsy before it happens. Contact your health care provider right away if this happens to you. There have been reports of increased sexual urges or other strong urges such as gambling while taking this medicine. If you experience any of these while taking this medicine, you should report this to your health care provider as soon as possible. Your mouth may get dry. Chewing sugarless gum or sucking hard candy and drinking plenty of water may help. Contact your health care professional if the problem does not go away or is severe. You  should check your skin often for changes to moles and new growths while taking this medicine. Call your doctor if you notice any of these changes. What side effects may I notice from receiving this medicine? Side effects that you should report to your doctor or health care professional as soon as possible:  allergic reactions like skin rash, itching or hives, swelling of the face, lips, or tongue  breathing problems  changes in emotions or moods  changes in vision  chest pain  confusion  falling asleep during normal activities like driving  fast, irregular heartbeat  hallucinations  joint or muscle pain  loss of bladder control  loss of memory  new or increased gambling urges, sexual urges,  uncontrolled spending, binge or compulsive eating, or other urges  pain, tingling, numbness in the hands or feet  signs and symptoms of low blood pressure like dizziness; feeling faint or lightheaded, falls; unusually weak or tired  swelling of the ankles, feet, hands  uncontrollable movements of the arms, face, head, mouth, neck, or upper body  vomiting Side effects that usually do not require medical attention (report to your doctor or health care professional if they continue or are bothersome):  dizziness  drowsiness  headache  increased sweating  nausea This list may not describe all possible side effects. Call your doctor for medical advice about side effects. You may report side effects to FDA at 1-800-FDA-1088. Where should I keep my medicine? Keep out of the reach of children. Store at room temperature between 20 and 25 degrees C (68 and 77 degrees F). Protect from light and moisture. Keep container tightly closed. Throw away any unused medicine after the expiration date. NOTE: This sheet is a summary. It may not cover all possible information. If you have questions about this medicine, talk to your doctor, pharmacist, or health care provider.  2020 Elsevier/Gold  Standard (2019-08-03 16:52:05)

## 2020-09-20 NOTE — Progress Notes (Signed)
Complete physical exam   Patient: [name]   DOB: 1956/06/28   64 y.o. Female  MRN: [id number] Visit Date: 09/20/2020  Today's healthcare provider: Mar Daring, PA-C   Chief Complaint  Patient presents with   Annual Exam   Subjective    [name] is a 64 y.o. female who presents today for a complete physical exam.  She reports consuming a general diet. The patient has a physically strenuous job, but has no regular exercise apart from work.  She generally feels well. She reports sleeping poorly. She does have additional problems to discuss today.  HPI    Past Medical History:  Diagnosis Date   Anxiety    Arthritis    Cataract    left   Depression    Diabetes mellitus without complication ([place])    Environmental and seasonal allergies    GERD (gastroesophageal reflux disease)    Hyperlipidemia    Hypertension    Joint pain    as reported by patient   Urinary incontinence    as stated by patient   Past Surgical History:  Procedure Laterality Date   BREAST EXCISIONAL BIOPSY Right 1999   neg   BREAST LUMPECTOMY Right 2005   as reported by patient   CATARACT EXTRACTION  January 2013   CATARACT EXTRACTION W/PHACO Left 12/02/2016   Procedure: CATARACT EXTRACTION PHACO AND INTRAOCULAR LENS PLACEMENT ([place]);  Surgeon: Estill Cotta, MD;  Location: [facility] ORS;  Service: Ophthalmology;  Laterality: Left;  [place] 2:14 AP% 28.4 CDE 59.73 Fluid pack lot # [id number]   DIAGNOSTIC LAPAROSCOPY     TOTAL HIP ARTHROPLASTY Right 04/04/2018   Procedure: TOTAL HIP ARTHROPLASTY;  Surgeon: Dereck Leep, MD;  Location: [facility] OR[facility];  Service: Orthopedics;  Laterality: Right;   Social History   Socioeconomic History   Marital status: Widowed    Spouse name: Not on file   Number of children: Not on file   Years of education: Not on file   Highest education level: Not on file  Occupational History    Employer: [employer]  Tobacco Use    Smoking status: Former Smoker    Packs/day: 1.50    Years: 25.00    Pack years: 37.50    Types: Cigarettes    Quit date: 03/24/2007    Years since quitting: 13.5   Smokeless tobacco: Never Used  Vaping Use   Vaping Use: Never used  Substance and Sexual Activity   Alcohol use: Yes    Alcohol/week: 0.0 standard drinks    Comment: 1- every 3-4 months   Drug use: No   Sexual activity: Not on file  Other Topics Concern   Not on file  Social History Narrative   Not on file   Social Determinants of Health   Financial Resource Strain:    Difficulty of Paying Living Expenses: Not on file  Food Insecurity:    Worried About [place] in the Last Year: Not on file   Ran Out of Food in the Last Year: Not on file  Transportation Needs:    Lack of Transportation (Medical): Not on file   Lack of Transportation (Non-Medical): Not on file  Physical Activity:    Days of Exercise per Week: Not on file   Minutes of Exercise per Session: Not on file  Stress:    Feeling of Stress : Not on file  Social Connections:    Frequency of Communication with Friends and  Family: Not on file   Frequency of Social Gatherings with Friends and Family: Not on file   Attends Religious Services: Not on file   Active Member of Clubs or Organizations: Not on file   Attends Archivist Meetings: Not on file   Marital Status: Not on file  Intimate Partner Violence:    Fear of Current or Ex-Partner: Not on file   Emotionally Abused: Not on file   Physically Abused: Not on file   Sexually Abused: Not on file   Family Status  Relation Name Status   Brother  Alive   Mother  Deceased   Father  Deceased   PGM  Deceased   PGF  Deceased   MGM  Deceased   MGF  Deceased   Daughter  Alive   Son  Alive   Neg Hx  (Not Specified)   Family History  Problem Relation Age of Onset   Bone cancer Brother    Aneurysm Mother        brain   Diabetes Father     CAD Father    Heart failure Father    Colon cancer Father    Cancer Father    Alzheimer's disease Paternal Grandmother    Dementia Paternal Grandmother    Mental illness Paternal Grandfather    Healthy Daughter    Healthy Son    Breast cancer Neg Hx    Allergies  Allergen Reactions   Jardiance [Empagliflozin] Itching    Recurrent mycotic infections   Penicillins Rash and Other (See Comments)    Has patient had a PCN reaction causing immediate rash, facial/tongue/throat swelling, SOB or lightheadedness with hypotension: No Has patient had a PCN reaction causing severe rash involving mucus membranes or skin necrosis: No Has patient had a PCN reaction that required hospitalization No Has patient had a PCN reaction occurring within the last 10 years: No If all of the above answers are "NO", then may proceed with Cephalosporin use.     Patient Care Team: Rubye Beach as PCP - General (Physician Assistant)   Medications: Outpatient Medications Prior to Visit  Medication Sig   ALPRAZolam (XANAX) 0.5 MG tablet TAKE 1 TABLET BY MOUTH 2 TIMES DAILY AS NEEDED FOR ANXIETY.   Blood Glucose Monitoring Suppl (ACCU-CHEK GUIDE) w/Device KIT 1 kit by Does not apply route 2 (two) times daily. To check blood sugar twice daily   glipiZIDE (GLUCOTROL XL) 5 MG 24 hr tablet Take 1 tablet (5 mg total) by mouth daily with breakfast.   glucose blood (FREESTYLE LITE) test strip CHECK BLOOD SUGAR TWICE DAILY   hydrochlorothiazide (HYDRODIURIL) 25 MG tablet Take 1 tablet (25 mg total) by mouth daily.   hydrOXYzine (ATARAX/VISTARIL) 10 MG tablet Take 1 tablet (10 mg total) by mouth every 8 (eight) hours as needed for itching. Will cause drowsiness.   Lancets (FREESTYLE) lancets CHECK BLOOD SUGAR TWICE DAILY   losartan (COZAAR) 25 MG tablet Take 1 tablet (25 mg total) by mouth 2 (two) times daily.   metFORMIN (GLUCOPHAGE) 1000 MG tablet Take 1 tablet (1,000 mg total) by mouth 2  (two) times daily with a meal.   Multiple Vitamin (MULTIVITAMIN) tablet Take 1 tablet by mouth daily.   pioglitazone (ACTOS) 45 MG tablet Take 1 tablet (45 mg total) by mouth daily.   sertraline (ZOLOFT) 100 MG tablet Take 1 tablet (100 mg total) by mouth daily.   simvastatin (ZOCOR) 20 MG tablet Take 1 tablet (20 mg total) by  mouth at bedtime.   sitaGLIPtin (JANUVIA) 100 MG tablet Take 1 tablet (100 mg total) by mouth daily.   No facility-administered medications prior to visit.    Review of Systems  Constitutional: Negative.   HENT: Negative.   Eyes: Negative.   Respiratory: Negative.   Cardiovascular: Negative.   Gastrointestinal: Positive for nausea and vomiting. Negative for abdominal distention, abdominal pain, anal bleeding, blood in stool, constipation, diarrhea and rectal pain.  Endocrine: Negative.   Genitourinary: Negative.   Musculoskeletal: Negative.   Skin: Negative.   Allergic/Immunologic: Negative.   Neurological: Negative.   Hematological: Negative.   Psychiatric/Behavioral: Negative.     Last CBC Lab Results  Component Value Date   WBC 7.5 08/24/2019   HGB 14.5 08/24/2019   HCT 45.4 08/24/2019   MCV 88 08/24/2019   MCH 28.0 08/24/2019   RDW 12.0 08/24/2019   PLT 286 10/16/1593   Last metabolic panel Lab Results  Component Value Date   GLUCOSE 617 (HH) 08/24/2019   NA 128 (L) 08/24/2019   K 4.6 08/24/2019   CL 85 (L) 08/24/2019   CO2 25 08/24/2019   BUN 23 08/24/2019   CREATININE 1.33 (H) 08/24/2019   GFRNONAA 42 (L) 08/24/2019   GFRAA 49 (L) 08/24/2019   CALCIUM 9.5 08/24/2019   PROT 8.0 08/24/2019   ALBUMIN 4.3 08/24/2019   LABGLOB 3.7 08/24/2019   AGRATIO 1.2 08/24/2019   BILITOT 0.5 08/24/2019   ALKPHOS 69 08/24/2019   AST 29 08/24/2019   ALT 32 08/24/2019   ANIONGAP 12 03/23/2018   Last lipids Lab Results  Component Value Date   CHOL 232 (H) 08/24/2019   HDL 38 (L) 08/24/2019   LDLCALC 139 (H) 08/24/2019   TRIG 302 (H)  08/24/2019   CHOLHDL 6.1 (H) 08/24/2019   Last hemoglobin A1c Lab Results  Component Value Date   HGBA1C 12.6 (H) 08/24/2019   Last thyroid functions Lab Results  Component Value Date   TSH 2.990 08/24/2019      Objective    BP (!) 148/74 (BP Location: Left Arm, Patient Position: Sitting, Cuff Size: Large)    Pulse 65    Temp 98 F (36.7 C) (Oral)    Ht 5' 7"  (1.702 m)    Wt 221 lb (100.2 kg)    BMI 34.61 kg/m    Physical Exam Vitals reviewed.  Constitutional:      General: She is not in acute distress.    Appearance: Normal appearance. She is well-developed. She is obese. She is not ill-appearing or diaphoretic.  HENT:     Head: Normocephalic and atraumatic.     Right Ear: Tympanic membrane, ear canal and external ear normal.     Left Ear: Tympanic membrane, ear canal and external ear normal.     Nose: Nose normal.     Mouth/Throat:     Mouth: Mucous membranes are moist.     Pharynx: Oropharynx is clear. No oropharyngeal exudate.  Eyes:     General: No scleral icterus.       Right eye: No discharge.        Left eye: No discharge.     Extraocular Movements: Extraocular movements intact.     Conjunctiva/sclera: Conjunctivae normal.     Pupils: Pupils are equal, round, and reactive to light.  Neck:     Thyroid: No thyromegaly.     Vascular: No carotid bruit or JVD.     Trachea: No tracheal deviation.  Cardiovascular:  Rate and Rhythm: Normal rate and regular rhythm.     Pulses: Normal pulses.     Heart sounds: Normal heart sounds. No murmur heard.  No friction rub. No gallop.   Pulmonary:     Effort: Pulmonary effort is normal. No respiratory distress.     Breath sounds: Normal breath sounds. No wheezing or rales.  Chest:     Chest wall: No tenderness.  Abdominal:     General: Abdomen is flat. Bowel sounds are normal. There is no distension.     Palpations: Abdomen is soft. There is no mass.     Tenderness: There is no abdominal tenderness. There is no  guarding or rebound.  Musculoskeletal:        General: No tenderness. Normal range of motion.     Cervical back: Normal range of motion and neck supple. No tenderness.     Right lower leg: No edema.     Left lower leg: No edema.  Lymphadenopathy:     Cervical: No cervical adenopathy.  Skin:    General: Skin is warm and dry.     Capillary Refill: Capillary refill takes less than 2 seconds.     Findings: No rash.  Neurological:     General: No focal deficit present.     Mental Status: She is alert and oriented to person, place, and time. Mental status is at baseline.  Psychiatric:        Mood and Affect: Mood normal.        Behavior: Behavior normal.        Thought Content: Thought content normal.        Judgment: Judgment normal.      Last depression screening scores PHQ 2/9 Scores 09/20/2020 08/24/2019 03/24/2018  PHQ - 2 Score 1 1 2   PHQ- 9 Score 4 3 4    Last fall risk screening Fall Risk  09/20/2020  Falls in the past year? 0  Number falls in past yr: 0  Injury with Fall? 0  Risk for fall due to : No Fall Risks  Follow up Follow up appointment  Comment -   Last Audit-C alcohol use screening Alcohol Use Disorder Test (AUDIT) 09/20/2020  1. How often do you have a drink containing alcohol? 0  2. How many drinks containing alcohol do you have on a typical day when you are drinking? 0  3. How often do you have six or more drinks on one occasion? 0  AUDIT-C Score 0  Alcohol Brief Interventions/Follow-up AUDIT Score <7 follow-up not indicated   A score of 3 or more in women, and 4 or more in men indicates increased risk for alcohol abuse, EXCEPT if all of the points are from question 1   No results found for any visits on 09/20/20.  Assessment & Plan    Routine Health Maintenance and Physical Exam  Exercise Activities and Dietary recommendations Goals   None     Immunization History  Administered Date(s) Administered   Influenza,inj,Quad PF,6+ Mos 08/29/2015    Pneumococcal Polysaccharide-23 11/22/2012   Zoster Recombinat (Shingrix) 07/19/2020    Health Maintenance  Topic Date Due   COVID-19 Vaccine (1) Never done   TETANUS/TDAP  Never done   OPHTHALMOLOGY EXAM  09/17/2017   COLONOSCOPY  12/12/2017   PAP SMEAR-Modifier  11/11/2019   DEXA SCAN  Never done   PNA vac Low Risk Adult (1 of 2 - PCV13) 12/01/2019   HEMOGLOBIN A1C  02/21/2020   INFLUENZA VACCINE  07/14/2020   FOOT EXAM  08/23/2020   MAMMOGRAM  09/25/2021   Hepatitis C Screening  Completed   HIV Screening  Completed    Discussed health benefits of physical activity, and encouraged her to engage in regular exercise appropriate for her age and condition.  1. Annual physical exam Normal physical exam today. Will check labs as below and f/u pending lab results. If labs are stable and WNL she will not need to have these rechecked for one year at her next annual physical exam. She is to call the office in the meantime if she has any acute issue, questions or concerns.  2. RLS (restless legs syndrome) New. Will start requip as below and see if she tolerates well and that it helps her RLS. Will check labs as below and f/u pending results. F/U in 4-6 weeks.  - rOPINIRole (REQUIP) 1 MG tablet; Take 1 tablet (1 mg total) by mouth at bedtime.  Dispense: 30 tablet; Refill: 1 - CBC w/Diff/Platelet - Comprehensive Metabolic Panel (CMET) - TSH  3. Primary hypertension Stable. Continue Losartan 89m BID and HCTZ 248m Will check labs as below and f/u pending results. - CBC w/Diff/Platelet - Comprehensive Metabolic Panel (CMET) - HgB A1c - Lipid Panel With LDL/HDL Ratio - TSH  4. Type 2 diabetes mellitus with stage 2 chronic kidney disease, without long-term current use of insulin ([place]ing well. Continue Glipizide XR 50m8mmetformin 1000m39mD, pioglitazone 450mg42md Januvia 100mg.29ml check labs as below and f/u pending results. - CBC w/Diff/Platelet - Comprehensive  Metabolic Panel (CMET) - HgB A1c - Lipid Panel With LDL/HDL Ratio - TSH  5. Avitaminosis D H/O this and postmenopausal. Will check labs as below and f/u pending results. - CBC w/Diff/Platelet - Comprehensive Metabolic Panel (CMET) - Vitamin D (25 hydroxy)  6. Hypercholesterolemia without hypertriglyceridemia Stable. Continue Simvastatin 20mg. 650m check labs as below and f/u pending results. - CBC w/Diff/Platelet - Comprehensive Metabolic Panel (CMET) - HgB A1c - Lipid Panel With LDL/HDL Ratio - TSH  7. Class 1 obesity due to excess calories with serious comorbidity and body mass index (BMI) of 34.0 to 34.9 in adult Counseled patient on healthy lifestyle modifications including dieting and exercise. Will check labs as below and f/u pending results. - CBC w/Diff/Platelet - Comprehensive Metabolic Panel (CMET) - HgB A1c - Lipid Panel With LDL/HDL Ratio - TSH  8. Recurrent major depressive disorder, in full remission (HCC) Do[place] well. Continue Sertraline 100mg da67mand Alprazolam 0.50mg BID 850m anxiety. Will check labs as below and f/u pending results. - TSH   No follow-ups on file.     I, JennifReynolds Bowlave reviewed all documentation for this visit. The documentation on 09/24/20 for the exam, diagnosis, procedures, and orders are all accurate and complete.   Amunique Neyra Rubye Be[facility]oCommunity [phone] [facility]

## 2020-09-21 ENCOUNTER — Encounter: Payer: Self-pay | Admitting: Physician Assistant

## 2020-09-21 DIAGNOSIS — R42 Dizziness and giddiness: Secondary | ICD-10-CM

## 2020-09-21 LAB — HEMOGLOBIN A1C
Est. average glucose Bld gHb Est-mCnc: 180 mg/dL
Hgb A1c MFr Bld: 7.9 % — ABNORMAL HIGH (ref 4.8–5.6)

## 2020-09-21 LAB — COMPREHENSIVE METABOLIC PANEL
ALT: 15 IU/L (ref 0–32)
AST: 18 IU/L (ref 0–40)
Albumin/Globulin Ratio: 1.3 (ref 1.2–2.2)
Albumin: 4.4 g/dL (ref 3.8–4.8)
Alkaline Phosphatase: 55 IU/L (ref 44–121)
BUN/Creatinine Ratio: 16 (ref 12–28)
BUN: 14 mg/dL (ref 8–27)
Bilirubin Total: 0.4 mg/dL (ref 0.0–1.2)
CO2: 25 mmol/L (ref 20–29)
Calcium: 9.8 mg/dL (ref 8.7–10.3)
Chloride: 95 mmol/L — ABNORMAL LOW (ref 96–106)
Creatinine, Ser: 0.87 mg/dL (ref 0.57–1.00)
GFR calc Af Amer: 81 mL/min/{1.73_m2} (ref >59)
GFR calc non Af Amer: 70 mL/min/{1.73_m2} (ref >59)
Globulin, Total: 3.3 g/dL (ref 1.5–4.5)
Glucose: 146 mg/dL — ABNORMAL HIGH (ref 65–99)
Potassium: 4 mmol/L (ref 3.5–5.2)
Sodium: 135 mmol/L (ref 134–144)
Total Protein: 7.7 g/dL (ref 6.0–8.5)

## 2020-09-21 LAB — CBC WITH DIFFERENTIAL/PLATELET
Basophils Absolute: 0 10*3/uL (ref 0.0–0.2)
Basos: 1 %
EOS (ABSOLUTE): 0.2 10*3/uL (ref 0.0–0.4)
Eos: 3 %
Hematocrit: 39.4 % (ref 34.0–46.6)
Hemoglobin: 13.4 g/dL (ref 11.1–15.9)
Immature Grans (Abs): 0 10*3/uL (ref 0.0–0.1)
Immature Granulocytes: 0 %
Lymphocytes Absolute: 1.5 10*3/uL (ref 0.7–3.1)
Lymphs: 22 %
MCH: 29.3 pg (ref 26.6–33.0)
MCHC: 34 g/dL (ref 31.5–35.7)
MCV: 86 fL (ref 79–97)
Monocytes Absolute: 0.6 10*3/uL (ref 0.1–0.9)
Monocytes: 9 %
Neutrophils Absolute: 4.7 10*3/uL (ref 1.4–7.0)
Neutrophils: 65 %
Platelets: 266 10*3/uL (ref 150–450)
RBC: 4.57 x10E6/uL (ref 3.77–5.28)
RDW: 12.8 % (ref 11.7–15.4)
WBC: 7.1 10*3/uL (ref 3.4–10.8)

## 2020-09-21 LAB — LIPID PANEL WITH LDL/HDL RATIO
Cholesterol, Total: 233 mg/dL — ABNORMAL HIGH (ref 100–199)
HDL: 47 mg/dL (ref >39)
LDL Chol Calc (NIH): 152 mg/dL — ABNORMAL HIGH (ref 0–99)
LDL/HDL Ratio: 3.2 ratio (ref 0.0–3.2)
Triglycerides: 187 mg/dL — ABNORMAL HIGH (ref 0–149)
VLDL Cholesterol Cal: 34 mg/dL (ref 5–40)

## 2020-09-21 LAB — TSH: TSH: 3.5 u[IU]/mL (ref 0.450–4.500)

## 2020-09-21 LAB — VITAMIN D 25 HYDROXY (VIT D DEFICIENCY, FRACTURES): Vit D, 25-Hydroxy: 25 ng/mL — ABNORMAL LOW (ref 30.0–100.0)

## 2020-09-27 ENCOUNTER — Other Ambulatory Visit: Payer: Self-pay | Admitting: Physician Assistant

## 2020-09-27 MED ORDER — MECLIZINE HCL 25 MG PO TABS: 25.0000 mg | ORAL_TABLET | Freq: Three times a day (TID) | ORAL | 0 refills | Status: DC | PRN

## 2020-09-27 NOTE — Addendum Note (Signed)
Addended by: Doristine Devoid on: 09/27/2020 03:29 PM   Modules accepted: Orders

## 2020-09-27 NOTE — Addendum Note (Signed)
Addended by: Doristine Devoid on: 09/27/2020 05:12 PM   Modules accepted: Orders

## 2020-10-02 ENCOUNTER — Encounter: Payer: Self-pay | Admitting: Physician Assistant

## 2020-10-15 ENCOUNTER — Other Ambulatory Visit: Payer: Self-pay

## 2020-10-15 ENCOUNTER — Ambulatory Visit
Admission: RE | Admit: 2020-10-15 | Discharge: 2020-10-15 | Disposition: A | Discharge status: Home / Self Care | Payer: 59 | Source: Ambulatory Visit | Attending: Physician Assistant | Admitting: Physician Assistant

## 2020-10-15 DIAGNOSIS — Z1231 Encounter for screening mammogram for malignant neoplasm of breast: Secondary | ICD-10-CM | POA: Diagnosis not present

## 2020-10-17 ENCOUNTER — Telehealth: Payer: Self-pay

## 2020-10-17 NOTE — Telephone Encounter (Signed)
Written by Mar Daring, PA-C on 10/17/2020 10:11 AM EDT Seen by patient [name] on 10/17/2020 10:22 AM

## 2020-10-17 NOTE — Telephone Encounter (Signed)
-----   Message from Mar Daring, PA-C sent at 10/17/2020 10:11 AM EDT ----- Normal mammogram. Repeat screening in one year.

## 2020-10-25 ENCOUNTER — Other Ambulatory Visit: Payer: Self-pay

## 2020-10-25 ENCOUNTER — Ambulatory Visit (INDEPENDENT_AMBULATORY_CARE_PROVIDER_SITE_OTHER): Payer: 59 | Admitting: Physician Assistant

## 2020-10-25 DIAGNOSIS — Z23 Encounter for immunization: Secondary | ICD-10-CM

## 2020-10-25 NOTE — Progress Notes (Signed)
Patient here for second shingles vaccine. Tolerated well.

## 2020-11-15 ENCOUNTER — Other Ambulatory Visit: Payer: Self-pay | Admitting: Physician Assistant

## 2020-11-15 DIAGNOSIS — G2581 Restless legs syndrome: Secondary | ICD-10-CM

## 2021-01-14 ENCOUNTER — Other Ambulatory Visit: Payer: Self-pay | Admitting: Physician Assistant

## 2021-01-14 DIAGNOSIS — G2581 Restless legs syndrome: Secondary | ICD-10-CM

## 2021-01-14 DIAGNOSIS — I10 Essential (primary) hypertension: Secondary | ICD-10-CM

## 2021-01-14 NOTE — Telephone Encounter (Signed)
Requested medication (s) are due for refill today: Yes  Requested medication (s) are on the active medication list: Yes  Last refill:  08/24/19  Future visit scheduled: Yes  Notes to clinic:  Unable to refill per protocol, Rx expired.      Requested Prescriptions  Pending Prescriptions Disp Refills   losartan (COZAAR) 25 MG tablet [Pharmacy Med Name: LOSARTAN POTASSIUM 25 MG TA 25 Tablet] 180 tablet 1    Sig: TAKE 1 TABLET BY MOUTH TWICE DAILY      Cardiovascular:  Angiotensin Receptor Blockers Failed - 01/14/2021 10:24 AM      Failed - Last BP in normal range    BP Readings from Last 1 Encounters:  09/20/20 (!) 148/74          Passed - Cr in normal range and within 180 days    Creatinine  Date Value Ref Range Status  09/06/2013 0.80 0.60 - 1.30 mg/dL Final   Creatinine, Ser  Date Value Ref Range Status  09/20/2020 0.87 0.57 - 1.00 mg/dL Final          Passed - K in normal range and within 180 days    Potassium  Date Value Ref Range Status  09/20/2020 4.0 3.5 - 5.2 mmol/L Final  09/06/2013 4.4 3.5 - 5.1 mmol/L Final          Passed - Patient is not pregnant      Passed - Valid encounter within last 6 months    Recent Outpatient Visits           3 months ago Annual physical exam   [facility] [place] M[place] M, PA-C   4 months ago Suspected COVID-19 virus infection   [facility], Vickki Muff, PA-C   5 months ago Type 2 diabetes mellitus with stage 2 chronic kidney disease, without long-term current use of insulin ([place])   [place], [place], PA-C   1 year ago Urinary tract infection with hematuria, site unspecified   [facility], Kelby Aline, FNP   1 year ago [place], Wendee Beavers, PA-C       Future Appointments             In 2 months [place], Clearnce Sorrel, PA-C [facility], [facility]              Signed  Prescriptions Disp Refills   rOPINIRole (REQUIP) 1 MG tablet 90 tablet 2    Sig: TAKE 1 TABLET BY MOUTH AT BEDTIME.      Neurology:  Parkinsonian Agents Failed - 01/14/2021 10:24 AM      Failed - Last BP in normal range    BP Readings from Last 1 Encounters:  09/20/20 (!) 148/74          Passed - Valid encounter within last 12 months    Recent Outpatient Visits           3 months ago Annual physical exam   [place], [place], [place]   4 months ago Suspected COVID-19 virus infection   [place], PA-C   5 months ago Type 2 diabetes mellitus with stage 2 chronic kidney disease, without long-term current use of insulin [facility])   [place], [place], [place]   1 year ago Urinary tract infection with hematuria, site unspecified   [facility] [place], Kelby Aline, FNP   1 year  ago [place], [place], [place]       Future Appointments             In 2 months Burnette, Clearnce Sorrel, PA-C [employer] [employer], [place]

## 2021-02-25 ENCOUNTER — Other Ambulatory Visit: Payer: Self-pay | Admitting: Physician Assistant

## 2021-02-25 ENCOUNTER — Telehealth (INDEPENDENT_AMBULATORY_CARE_PROVIDER_SITE_OTHER): Payer: 59 | Admitting: Physician Assistant

## 2021-02-25 DIAGNOSIS — R059 Cough, unspecified: Secondary | ICD-10-CM | POA: Diagnosis not present

## 2021-02-25 DIAGNOSIS — R0981 Nasal congestion: Secondary | ICD-10-CM

## 2021-02-25 MED ORDER — PREDNISONE 20 MG PO TABS: 20.0000 mg | ORAL_TABLET | Freq: Every day | ORAL | 0 refills | Status: DC

## 2021-02-25 MED ORDER — PROMETHAZINE-DM 6.25-15 MG/5ML PO SYRP: 5.0000 mL | ORAL_SOLUTION | Freq: Every evening | ORAL | 0 refills | Status: DC | PRN

## 2021-02-25 NOTE — Progress Notes (Signed)
Phone Visit    Virtual Visit via Video Note   This visit type was conducted due to national recommendations for restrictions regarding the COVID-19 Pandemic (e.g. social distancing) in an effort to limit this patient's exposure and mitigate transmission in our community. This patient is at least at moderate risk for complications without adequate follow up. This format is felt to be most appropriate for this patient at this time. Physical exam was limited by quality of the video and audio technology used for the visit.   Patient location: home Provider location: office  I discussed the limitations of evaluation and management by telemedicine and the availability of in person appointments. The patient expressed understanding and agreed to proceed.  Patient: [name]   DOB: 1956/04/17   65 y.o. Female  MRN: [id number] Visit Date: 02/25/2021  Today's healthcare provider: Trinna Post, PA-C   Chief Complaint  Patient presents with  . Sore Throat   Subjective    HPI  Patient states she began having sinus symptoms 5 nights ago. She reports sinus congestion, sore throat, fevers, chills. She reports taking some cold and sinus relief. She reports a persistent cough. She reports she feels somewhat better than when her started. She was sent home from work on Friday and instructed to have a covid test.  She was tested and states it came back negative.  When she reported to work that she still had symptoms yesterday they instructed her to be tested again.  She was tested again yesterday but does not have those results back yet.  She is starting to feel better.     Medications: Outpatient Medications Prior to Visit  Medication Sig  . ALPRAZolam (XANAX) 0.5 MG tablet TAKE 1 TABLET BY MOUTH 2 TIMES DAILY AS NEEDED FOR ANXIETY.  . Blood Glucose Monitoring Suppl (ACCU-CHEK GUIDE) w/Device KIT 1 kit by Does not apply route 2 (two) times daily. To check blood sugar twice daily  .  glipiZIDE (GLUCOTROL XL) 5 MG 24 hr tablet Take 1 tablet (5 mg total) by mouth daily with breakfast.  . glucose blood (FREESTYLE LITE) test strip CHECK BLOOD SUGAR TWICE DAILY  . hydrochlorothiazide (HYDRODIURIL) 25 MG tablet Take 1 tablet (25 mg total) by mouth daily.  . hydrOXYzine (ATARAX/VISTARIL) 10 MG tablet Take 1 tablet (10 mg total) by mouth every 8 (eight) hours as needed for itching. Will cause drowsiness.  . Lancets (FREESTYLE) lancets CHECK BLOOD SUGAR TWICE DAILY  . losartan (COZAAR) 25 MG tablet TAKE 1 TABLET BY MOUTH TWICE DAILY  . meclizine (ANTIVERT) 25 MG tablet Take 1 tablet (25 mg total) by mouth 3 (three) times daily as needed for dizziness.  . metFORMIN (GLUCOPHAGE) 1000 MG tablet Take 1 tablet (1,000 mg total) by mouth 2 (two) times daily with a meal.  . Multiple Vitamin (MULTIVITAMIN) tablet Take 1 tablet by mouth daily.  . pioglitazone (ACTOS) 45 MG tablet Take 1 tablet (45 mg total) by mouth daily.  Marland Kitchen rOPINIRole (REQUIP) 1 MG tablet TAKE 1 TABLET BY MOUTH AT BEDTIME.  Marland Kitchen sertraline (ZOLOFT) 100 MG tablet Take 1 tablet (100 mg total) by mouth daily.  . simvastatin (ZOCOR) 20 MG tablet Take 1 tablet (20 mg total) by mouth at bedtime.  . sitaGLIPtin (JANUVIA) 100 MG tablet Take 1 tablet (100 mg total) by mouth daily.   No facility-administered medications prior to visit.    Review of Systems  Constitutional: Positive for fatigue and fever (low grade temp of 100  at highest). Negative for chills and diaphoresis.  HENT: Positive for congestion, ear pain, postnasal drip, rhinorrhea, sinus pressure, sinus pain, sneezing, sore throat and tinnitus. Negative for ear discharge, facial swelling and trouble swallowing (hurts).   Respiratory: Positive for cough, shortness of breath and wheezing. Negative for chest tightness.   Cardiovascular: Negative for chest pain.  Gastrointestinal: Negative for abdominal pain, diarrhea, nausea and vomiting.  Musculoskeletal: Negative for  myalgias.  Neurological: Positive for dizziness and headaches.       Objective    There were no vitals taken for this visit.    Physical Exam Constitutional:      Appearance: Normal appearance.  Pulmonary:     Effort: Pulmonary effort is normal. No respiratory distress.  Neurological:     Mental Status: She is alert.  Psychiatric:        Mood and Affect: Mood normal.        Behavior: Behavior normal.        Assessment & Plan    1. Sinus congestion  Suspect this is a viral syndrome. Can try course of prednisone. Advised if not improving after 5 additional days, can consider abx for sinusitis.   - promethazine-dextromethorphan (PROMETHAZINE-DM) 6.25-15 MG/5ML syrup; Take 5 mLs by mouth at bedtime as needed.  Dispense: 118 mL; Refill: 0 - predniSONE (DELTASONE) 20 MG tablet; Take 1 tablet (20 mg total) by mouth daily with breakfast.  Dispense: 5 tablet; Refill: 0  2. Cough  - promethazine-dextromethorphan (PROMETHAZINE-DM) 6.25-15 MG/5ML syrup; Take 5 mLs by mouth at bedtime as needed.  Dispense: 118 mL; Refill: 0   Return if symptoms worsen or fail to improve.     I discussed the assessment and treatment plan with the patient. The patient was provided an opportunity to ask questions and all were answered. The patient agreed with the plan and demonstrated an understanding of the instructions.   The patient was advised to call back or seek an in-person evaluation if the symptoms worsen or if the condition fails to improve as anticipated.   ITrinna Post, PA-C, have reviewed all documentation for this visit. The documentation on 02/27/21 for the exam, diagnosis, procedures, and orders are all accurate and complete.  The entirety of the information documented in the History of Present Illness, Review of Systems and Physical Exam were personally obtained by me. Portions of this information were initially documented by [facility] and reviewed by me for thoroughness  and accuracy.    Paulene Floor [facility] [phone] (phone) [phone] (fax)  [place]

## 2021-03-10 ENCOUNTER — Other Ambulatory Visit: Payer: Self-pay | Admitting: Physician Assistant

## 2021-03-10 DIAGNOSIS — F419 Anxiety disorder, unspecified: Secondary | ICD-10-CM

## 2021-03-10 NOTE — Telephone Encounter (Signed)
Requested medication (s) are due for refill today: no  Requested medication (s) are on the active medication list: yes  Last refill: 02/14/2021  Future visit scheduled: yes   Notes to clinic:  this refill cannot be delegated    Requested Prescriptions  Pending Prescriptions Disp Refills   ALPRAZolam (XANAX) 0.5 MG tablet [Pharmacy Med Name: ALPRAZolam 0.5 MG TABS 0.5 Tablet] 30 tablet 5    Sig: TAKE 1 TABLET BY MOUTH 2 TIMES DAILY AS NEEDED FOR ANXIETY.      Not Delegated - Psychiatry:  Anxiolytics/Hypnotics Failed - 03/10/2021 11:20 AM      Failed - This refill cannot be delegated      Failed - Urine Drug Screen completed in last 360 days      Passed - Valid encounter within last 6 months    Recent Outpatient Visits           1 week ago Sinus congestion   [place], [place], PA-C   5 months ago Annual physical exam   [facility] Fenton Malling M, [place]   6 months ago Suspected COVID-19 virus infection   [place], PA-C   7 months ago Type 2 diabetes mellitus with stage 2 chronic kidney disease, without long-term current use of insulin [facility])   [place], [place], [place]   1 year ago Urinary tract infection with hematuria, site unspecified   [facility], Kelby Aline, FNP       Future Appointments             In 1 week Marlyn Corporal, Clearnce Sorrel, PA-C [employer], [facility]

## 2021-03-17 ENCOUNTER — Other Ambulatory Visit: Payer: Self-pay

## 2021-03-17 ENCOUNTER — Telehealth: Payer: Self-pay

## 2021-03-17 ENCOUNTER — Other Ambulatory Visit: Payer: Self-pay | Admitting: Physician Assistant

## 2021-03-17 DIAGNOSIS — R42 Dizziness and giddiness: Secondary | ICD-10-CM

## 2021-03-17 MED ORDER — MECLIZINE HCL 25 MG PO TABS
ORAL_TABLET | Freq: Three times a day (TID) | ORAL | 0 refills | Status: DC | PRN
  Filled 2021-03-17: qty 30, 10d supply, fill #0

## 2021-03-17 NOTE — Telephone Encounter (Signed)
Requested medication (s) are due for refill today: yes  Requested medication (s) are on the active medication list: yes  Last refill:  09/27/20  Future visit scheduled: yes  Notes to clinic:  med not delegated to NT to RF    Requested Prescriptions  Pending Prescriptions Disp Refills   meclizine (ANTIVERT) 25 MG tablet 30 tablet 0    Sig: TAKE 1 TABLET (25 MG TOTAL) BY MOUTH 3 (THREE) TIMES DAILY AS NEEDED FOR DIZZINESS.      Not Delegated - Gastroenterology: Antiemetics Failed - 03/17/2021 12:27 PM      Failed - This refill cannot be delegated      Passed - Valid encounter within last 6 months    Recent Outpatient Visits           2 weeks ago Sinus congestion   [facility] [place], Wendee [place], [place]   5 months ago Annual physical exam   [facility] Fenton Malling M, [place]   6 months ago Suspected COVID-19 virus infection   [place] C[place], PA-C   8 months ago Type 2 diabetes mellitus with stage 2 chronic kidney disease, without long-term current use of insulin [facility])   [place], [place], [place]   1 year ago Urinary tract infection with hematuria, site unspecified   [facility], Kelby Aline, FNP       Future Appointments             In 3 days [place], Clearnce Sorrel, PA-C [employer], [place]

## 2021-03-17 NOTE — Telephone Encounter (Signed)
FMLA paperwork receive via fax and put in Jenni's box

## 2021-03-19 NOTE — Progress Notes (Signed)
Established patient visit   Patient: [name]   DOB: Nov 26, 1956   65 y.o. Female  MRN: [id number] Visit Date: 03/20/2021  Today's healthcare provider: Mar Daring, PA-C   Chief Complaint  Patient presents with  . Diabetes  . Hypertension  . Hyperlipidemia  . Depression  . Anxiety   Subjective    HPI  Diabetes Mellitus Type II, follow-up  Lab Results  Component Value Date   HGBA1C 9.9 (A) 03/20/2021   HGBA1C 7.9 (H) 09/20/2020   HGBA1C 12.6 (H) 08/24/2019   Last seen for diabetes 6 months ago.  Management since then includes continue Glipizide XR 3m, metformin 10032mBID, pioglitazone 4560mand Januvia 100m55mhe reports excellent compliance with treatment. She is not having side effects.  Home blood sugar records: fasting range: 400  Episodes of hypoglycemia? No    Current insulin regiment: none Most Recent Eye Exam: none  --------------------------------------------------------------------------------------------------- Hypertension, follow-up  BP Readings from Last 3 Encounters:  03/20/21 (!) 178/99  09/20/20 (!) 148/74  07/19/20 (!) 150/88   Wt Readings from Last 3 Encounters:  03/20/21 219 lb (99.3 kg)  09/20/20 221 lb (100.2 kg)  07/19/20 235 lb 6.4 oz (106.8 kg)     She was last seen for hypertension 6 months ago.  BP at that visit was 148/74. Management since that visit includes continue Losartan 25mg49m and HCTZ 25mg 28mreports good compliance with treatment. She is not having side effects.  She is not exercising. She is not adherent to low salt diet.   Outside blood pressures are.  She does not smoke.  Use of agents associated with hypertension: decongestants.   --------------------------------------------------------------------------------------------------- Lipid/Cholesterol, follow-up  Last Lipid Panel: Lab Results  Component Value Date   CHOL 233 (H) 09/20/2020   LDLCALC 152 (H) 09/20/2020   HDL 47  09/20/2020   TRIG 187 (H) 09/20/2020    She was last seen for this 6 months ago.  Management since that visit includes due to labs wanted to increase simvastatin 20mg t66mmg, i21mtient was taking 20 mg as prescribed.  She reports excellent compliance with treatment. She is not having side effects.   Symptoms: No appetite changes No foot ulcerations  No chest pain No chest pressure/discomfort  No dyspnea No orthopnea  No fatigue No lower extremity edema  No palpitations No paroxysmal nocturnal dyspnea  Yes nausea No numbness or tingling of extremity  No polydipsia No polyuria  No speech difficulty No syncope   She is following a Regular diet. Current exercise: none  Last metabolic panel Lab Results  Component Value Date   GLUCOSE 146 (H) 09/20/2020   NA 135 09/20/2020   K 4.0 09/20/2020   BUN 14 09/20/2020   CREATININE 0.87 09/20/2020   GFRNONAA 70 09/20/2020   GFRAA 81 09/20/2020   CALCIUM 9.8 09/20/2020   AST 18 09/20/2020   ALT 15 09/20/2020   The 10-year ASCVD risk score (Goff DCMikey Bussing et al., 2013) is: 31.1%  ---------------------------------------------------------------------------------------------------   Patient Active Problem List   Diagnosis Date Noted  . Recurrent major depressive disorder, in partial remission (HCC) 10/[place]2021  . Status post total replacement of hip 04/04/2018  . Primary osteoarthritis of right hip 02/20/2018  . Allergic rhinitis 10/28/2015  . Acute stress disorder 08/01/2015  . Anxiety 08/01/2015  . Clinical depression 08/01/2015  . Diabetes (HCC) 08/[place]2016  . Diverticulosis of colon 08/01/2015  . Generalized pruritus 08/01/2015  . BP (high blood pressure)  08/01/2015  . Cannot sleep 08/01/2015  . Adiposity 08/01/2015  . Detrusor muscle hypertonia 08/01/2015  . Hypercholesterolemia without hypertriglyceridemia 08/01/2015  . Female stress incontinence 08/01/2015  . Avitaminosis D 08/01/2015   Social History   Tobacco Use   . Smoking status: Former Smoker    Packs/day: 1.50    Years: 25.00    Pack years: 37.50    Types: Cigarettes    Quit date: 03/24/2007    Years since quitting: 14.0  . Smokeless tobacco: Never Used  Vaping Use  . Vaping Use: Never used  Substance Use Topics  . Alcohol use: Yes    Alcohol/week: 0.0 standard drinks    Comment: 1- every 3-4 months  . Drug use: No   Allergies  Allergen Reactions  . Jardiance [Empagliflozin] Itching    Recurrent mycotic infections  . Penicillins Rash and Other (See Comments)    Has patient had a PCN reaction causing immediate rash, facial/tongue/throat swelling, SOB or lightheadedness with hypotension: No Has patient had a PCN reaction causing severe rash involving mucus membranes or skin necrosis: No Has patient had a PCN reaction that required hospitalization No Has patient had a PCN reaction occurring within the last 10 years: No If all of the above answers are "NO", then may proceed with Cephalosporin use.        Medications: Outpatient Medications Prior to Visit  Medication Sig  . ALPRAZolam (XANAX) 0.5 MG tablet TAKE 1 TABLET BY MOUTH 2 TIMES DAILY AS NEEDED FOR ANXIETY.  . Lancets (FREESTYLE) lancets CHECK BLOOD SUGAR TWICE DAILY  . Multiple Vitamin (MULTIVITAMIN) tablet Take 1 tablet by mouth daily.  . [DISCONTINUED] Blood Glucose Monitoring Suppl (ACCU-CHEK GUIDE) w/Device KIT 1 kit by Does not apply route 2 (two) times daily. To check blood sugar twice daily  . [DISCONTINUED] glipiZIDE (GLUCOTROL XL) 5 MG 24 hr tablet TAKE 1 TABLET BY MOUTH DAILY WITH BREAKFAST.  . [DISCONTINUED] glucose blood test strip CHECK BLOOD SUGAR TWICE DAILY (Patient taking differently: CHECK BLOOD SUGAR TWICE DAILY)  . [DISCONTINUED] hydrochlorothiazide (HYDRODIURIL) 25 MG tablet TAKE 1 TABLET BY MOUTH DAILY.  . [DISCONTINUED] hydrOXYzine (ATARAX/VISTARIL) 10 MG tablet TAKE 1 TABLET BY MOUTH EVERY 8 HOURS AS NEEDED FOR ITCHING. WILL CAUSE DROWSINESS.  .  [DISCONTINUED] losartan (COZAAR) 25 MG tablet TAKE 1 TABLET BY MOUTH TWICE DAILY  . [DISCONTINUED] meclizine (ANTIVERT) 25 MG tablet TAKE 1 TABLET (25 MG TOTAL) BY MOUTH 3 (THREE) TIMES DAILY AS NEEDED FOR DIZZINESS.  . [DISCONTINUED] metFORMIN (GLUCOPHAGE) 1000 MG tablet TAKE 1 TABLET BY MOUTH 2 TIMES DAILY WITH A MEAL.  . [DISCONTINUED] pioglitazone (ACTOS) 45 MG tablet TAKE 1 TABLET BY MOUTH DAILY.  . [DISCONTINUED] promethazine-dextromethorphan (PROMETHAZINE-DM) 6.25-15 MG/5ML syrup TAKE 5 MLS BY MOUTH AT BEDTIME AS NEEDED.  . [DISCONTINUED] rOPINIRole (REQUIP) 1 MG tablet TAKE 1 TABLET BY MOUTH AT BEDTIME.  . [DISCONTINUED] sertraline (ZOLOFT) 100 MG tablet TAKE 1 TABLET BY MOUTH DAILY.  . [DISCONTINUED] simvastatin (ZOCOR) 20 MG tablet Take 1 tablet (20 mg total) by mouth at bedtime.  . [DISCONTINUED] sitaGLIPtin (JANUVIA) 100 MG tablet TAKE 1 TABLET BY MOUTH DAILY.  . [DISCONTINUED] predniSONE (DELTASONE) 20 MG tablet TAKE 1 TABLET (20 MG TOTAL) BY MOUTH DAILY WITH BREAKFAST. (Patient not taking: Reported on 03/20/2021)   No facility-administered medications prior to visit.    Review of Systems  Constitutional: Positive for activity change, appetite change and fatigue.  HENT: Positive for congestion, ear pain, sinus pressure and sinus pain.   Cardiovascular:  Negative for chest pain and palpitations.  Gastrointestinal: Positive for nausea and vomiting.    Last CBC Lab Results  Component Value Date   WBC 7.1 09/20/2020   HGB 13.4 09/20/2020   HCT 39.4 09/20/2020   MCV 86 09/20/2020   MCH 29.3 09/20/2020   RDW 12.8 09/20/2020   PLT 266 09/20/2020       Objective    BP (!) 178/99 (BP Location: Right Arm, Patient Position: Sitting, Cuff Size: Large)   Pulse 77   Temp 98.7 F (37.1 C) (Oral)   Resp 16   Wt 219 lb (99.3 kg)   SpO2 97%   BMI 34.30 kg/m  BP Readings from Last 3 Encounters:  03/20/21 (!) 178/99  09/20/20 (!) 148/74  07/19/20 (!) 150/88   Wt Readings from  Last 3 Encounters:  03/20/21 219 lb (99.3 kg)  09/20/20 221 lb (100.2 kg)  07/19/20 235 lb 6.4 oz (106.8 kg)       Physical Exam Vitals reviewed.  Constitutional:      General: She is not in acute distress.    Appearance: Normal appearance. She is well-developed. She is not ill-appearing.  HENT:     Head: Normocephalic and atraumatic.     Right Ear: Hearing, tympanic membrane and external ear normal.     Left Ear: Hearing, tympanic membrane and external ear normal.     Nose: Nose normal.     Mouth/Throat:     Pharynx: No oropharyngeal exudate.  Eyes:     General:        Right eye: No discharge.        Left eye: No discharge.     Conjunctiva/sclera: Conjunctivae normal.     Pupils: Pupils are equal, round, and reactive to light.  Neck:     Thyroid: No thyromegaly.     Vascular: No JVD.     Trachea: No tracheal deviation.     Meningeal: Brudzinski's sign and Kernig's sign absent.  Cardiovascular:     Rate and Rhythm: Normal rate and regular rhythm.     Heart sounds: Normal heart sounds. No murmur heard. No friction rub. No gallop.   Pulmonary:     Effort: Pulmonary effort is normal. No respiratory distress.     Breath sounds: Normal breath sounds. No stridor. No wheezing or rales.  Chest:     Chest wall: No tenderness.  Musculoskeletal:     Cervical back: Normal range of motion and neck supple.  Lymphadenopathy:     Cervical: No cervical adenopathy.  Skin:    General: Skin is warm and dry.  Neurological:     Mental Status: She is alert.     Results for orders placed or performed in visit on 03/20/21  POCT glycosylated hemoglobin (Hb A1C)  Result Value Ref Range   Hemoglobin A1C 9.9 (A) 4.0 - 5.6 %   Est. average glucose Bld gHb Est-mCnc 237     Assessment & Plan     1. Type 2 diabetes mellitus with stage 2 chronic kidney disease, without long-term current use of insulin (HCC) A1c up to 9.9 today, however, patient has not taken meds since Monday due to  dizziness and also recently had a steroid and cough medication that acutely elevated blood sugar as well. Medications refilled as below. Continue current medication regiment. F/U in 3 months with Dr. Caryn Section.  - POCT glycosylated hemoglobin (Hb A1C) - glipiZIDE (GLUCOTROL XL) 5 MG 24 hr tablet; TAKE 1 TABLET BY MOUTH DAILY  WITH BREAKFAST.  Dispense: 90 tablet; Refill: 3 - metFORMIN (GLUCOPHAGE) 1000 MG tablet; TAKE 1 TABLET BY MOUTH 2 TIMES DAILY WITH A MEAL.  Dispense: 180 tablet; Refill: 3 - pioglitazone (ACTOS) 45 MG tablet; TAKE 1 TABLET BY MOUTH DAILY.  Dispense: 90 tablet; Refill: 3 - simvastatin (ZOCOR) 20 MG tablet; Take 1 tablet (20 mg total) by mouth at bedtime.  Dispense: 90 tablet; Refill: 3 - sitaGLIPtin (JANUVIA) 100 MG tablet; TAKE 1 TABLET BY MOUTH DAILY.  Dispense: 90 tablet; Refill: 3 - Blood Glucose Monitoring Suppl (ACCU-CHEK GUIDE) w/Device KIT; Use to check blood sugar twice daily  Dispense: 1 kit; Refill: 0 - glucose blood test strip; CHECK BLOOD SUGAR TWICE DAILY  Dispense: 200 strip; Refill: 12  2. Dizziness Worsening since Monday. Patient does have signs of otitis media, right >left. Will treat with doxycycline as below and meclizine for dizziness. She is to remain out of work until Monday 03/24/21. If not improving she will most likely need ENT referral.  - meclizine (ANTIVERT) 25 MG tablet; TAKE 1 TABLET (25 MG TOTAL) BY MOUTH 3 (THREE) TIMES DAILY AS NEEDED FOR DIZZINESS.  Dispense: 30 tablet; Refill: 5  3. Vertigo See above medical treatment plan. - meclizine (ANTIVERT) 25 MG tablet; TAKE 1 TABLET (25 MG TOTAL) BY MOUTH 3 (THREE) TIMES DAILY AS NEEDED FOR DIZZINESS.  Dispense: 30 tablet; Refill: 5  4. Non-recurrent acute suppurative otitis media of both ears without spontaneous rupture of tympanic membranes See above medical treatment plan. - doxycycline (VIBRA-TABS) 100 MG tablet; Take 1 tablet (100 mg total) by mouth 2 (two) times daily.  Dispense: 20 tablet; Refill:  0  5. Primary hypertension Elevated today but patient has not taken meds since Monday. Diagnosis pulled for medication refill. Continue current medical treatment plan. - losartan (COZAAR) 25 MG tablet; TAKE 1 TABLET BY MOUTH TWICE DAILY  Dispense: 180 tablet; Refill: 3  6. Hypercholesterolemia without hypertriglyceridemia Stable. Diagnosis pulled for medication refill. Continue current medical treatment plan. - simvastatin (ZOCOR) 20 MG tablet; Take 1 tablet (20 mg total) by mouth at bedtime.  Dispense: 90 tablet; Refill: 3  7. Recurrent major depressive disorder, in full remission ([place]) Stable. Diagnosis pulled for medication refill. Continue current medical treatment plan. - sertraline (ZOLOFT) 100 MG tablet; TAKE 1 TABLET BY MOUTH DAILY.  Dispense: 90 tablet; Refill: 3  8. Situational anxiety Stable. Diagnosis pulled for medication refill. Continue current medical treatment plan. - sertraline (ZOLOFT) 100 MG tablet; TAKE 1 TABLET BY MOUTH DAILY.  Dispense: 90 tablet; Refill: 3  9. RLS (restless legs syndrome) Stable. Diagnosis pulled for medication refill. Continue current medical treatment plan. - rOPINIRole (REQUIP) 1 MG tablet; TAKE 1 TABLET BY MOUTH AT BEDTIME.  Dispense: 90 tablet; Refill: 3   Return in about 3 months (around 06/19/2021), or if symptoms worsen or fail to improve, for T2DM.      Reynolds Bowl, PA-C, have reviewed all documentation for this visit. The documentation on 03/20/21 for the exam, diagnosis, procedures, and orders are all accurate and complete.   Rubye Beach  [facility] [phone] (phone) [phone] (fax)  [place]

## 2021-03-20 ENCOUNTER — Ambulatory Visit (INDEPENDENT_AMBULATORY_CARE_PROVIDER_SITE_OTHER): Payer: 59 | Admitting: Physician Assistant

## 2021-03-20 ENCOUNTER — Encounter: Payer: Self-pay | Admitting: Physician Assistant

## 2021-03-20 ENCOUNTER — Other Ambulatory Visit: Payer: Self-pay

## 2021-03-20 VITALS — BP 178/99 | HR 77 | Temp 98.7°F | Resp 16 | Wt 219.0 lb

## 2021-03-20 DIAGNOSIS — E7800 Pure hypercholesterolemia, unspecified: Secondary | ICD-10-CM | POA: Diagnosis not present

## 2021-03-20 DIAGNOSIS — N182 Chronic kidney disease, stage 2 (mild): Secondary | ICD-10-CM | POA: Diagnosis not present

## 2021-03-20 DIAGNOSIS — F418 Other specified anxiety disorders: Secondary | ICD-10-CM

## 2021-03-20 DIAGNOSIS — E1122 Type 2 diabetes mellitus with diabetic chronic kidney disease: Secondary | ICD-10-CM | POA: Diagnosis not present

## 2021-03-20 DIAGNOSIS — R42 Dizziness and giddiness: Secondary | ICD-10-CM | POA: Diagnosis not present

## 2021-03-20 DIAGNOSIS — H66003 Acute suppurative otitis media without spontaneous rupture of ear drum, bilateral: Secondary | ICD-10-CM

## 2021-03-20 DIAGNOSIS — G2581 Restless legs syndrome: Secondary | ICD-10-CM

## 2021-03-20 DIAGNOSIS — I10 Essential (primary) hypertension: Secondary | ICD-10-CM

## 2021-03-20 DIAGNOSIS — F3342 Major depressive disorder, recurrent, in full remission: Secondary | ICD-10-CM

## 2021-03-20 LAB — POCT GLYCOSYLATED HEMOGLOBIN (HGB A1C)
Est. average glucose Bld gHb Est-mCnc: 237
Hemoglobin A1C: 9.9 % — AB (ref 4.0–5.6)

## 2021-03-20 MED ORDER — MECLIZINE HCL 25 MG PO TABS
ORAL_TABLET | Freq: Three times a day (TID) | ORAL | 5 refills | Status: DC | PRN
  Filled 2021-03-20: qty 30, 10d supply, fill #0

## 2021-03-20 MED ORDER — SITAGLIPTIN PHOSPHATE 100 MG PO TABS
ORAL_TABLET | Freq: Every day | ORAL | 3 refills | Status: DC
  Filled 2021-03-20: qty 30, 30d supply, fill #0

## 2021-03-20 MED ORDER — LOSARTAN POTASSIUM 25 MG PO TABS
ORAL_TABLET | Freq: Two times a day (BID) | ORAL | 3 refills | Status: DC
  Filled 2021-03-20: qty 180, 90d supply, fill #0

## 2021-03-20 MED ORDER — SIMVASTATIN 20 MG PO TABS
20.0000 mg | ORAL_TABLET | Freq: Every day | ORAL | 3 refills | Status: DC
  Filled 2021-03-20: qty 90, 90d supply, fill #0

## 2021-03-20 MED ORDER — GLIPIZIDE ER 5 MG PO TB24
ORAL_TABLET | Freq: Every day | ORAL | 3 refills | Status: DC
  Filled 2021-03-20: qty 90, 90d supply, fill #0

## 2021-03-20 MED ORDER — ROPINIROLE HCL 1 MG PO TABS
ORAL_TABLET | Freq: Every day | ORAL | 3 refills | Status: DC
  Filled 2021-03-20: qty 90, 90d supply, fill #0

## 2021-03-20 MED ORDER — SERTRALINE HCL 100 MG PO TABS
ORAL_TABLET | Freq: Every day | ORAL | 3 refills | Status: DC
  Filled 2021-03-20: qty 90, 90d supply, fill #0

## 2021-03-20 MED ORDER — DOXYCYCLINE HYCLATE 100 MG PO TABS
100.0000 mg | ORAL_TABLET | Freq: Two times a day (BID) | ORAL | 0 refills | Status: DC
Start: 2021-03-20 — End: 2021-04-19
  Filled 2021-03-20: qty 20, 10d supply, fill #0

## 2021-03-20 MED ORDER — PIOGLITAZONE HCL 45 MG PO TABS
ORAL_TABLET | Freq: Every day | ORAL | 3 refills | Status: DC
Start: 2021-03-20 — End: 2021-04-19
  Filled 2021-03-20: qty 90, 90d supply, fill #0

## 2021-03-20 MED ORDER — HYDROCHLOROTHIAZIDE 25 MG PO TABS
ORAL_TABLET | Freq: Every day | ORAL | 3 refills | Status: DC
  Filled 2021-03-20: qty 90, 90d supply, fill #0

## 2021-03-20 MED ORDER — GLUCOSE BLOOD VI STRP
ORAL_STRIP | 12 refills | Status: DC
  Filled 2021-03-20: qty 200, 90d supply, fill #0

## 2021-03-20 MED ORDER — ACCU-CHEK GUIDE W/DEVICE KIT
1.0000 | PACK | Freq: Two times a day (BID) | 0 refills | Status: DC
  Filled 2021-03-20: qty 1, 30d supply, fill #0

## 2021-03-20 MED ORDER — METFORMIN HCL 1000 MG PO TABS
ORAL_TABLET | Freq: Two times a day (BID) | ORAL | 3 refills | Status: DC
  Filled 2021-03-20: qty 180, 90d supply, fill #0

## 2021-03-20 NOTE — Patient Instructions (Signed)
Diabetes Mellitus and Nutrition, Adult When you have diabetes, or diabetes mellitus, it is very important to have healthy eating habits because your blood sugar (glucose) levels are greatly affected by what you eat and drink. Eating healthy foods in the right amounts, at about the same times every day, can help you:  Control your blood glucose.  Lower your risk of heart disease.  Improve your blood pressure.  Reach or maintain a healthy weight. What can affect my meal plan? Every person with diabetes is different, and each person has different needs for a meal plan. Your health care provider may recommend that you work with a dietitian to make a meal plan that is best for you. Your meal plan may vary depending on factors such as:  The calories you need.  The medicines you take.  Your weight.  Your blood glucose, blood pressure, and cholesterol levels.  Your activity level.  Other health conditions you have, such as heart or kidney disease. How do carbohydrates affect me? Carbohydrates, also called carbs, affect your blood glucose level more than any other type of food. Eating carbs naturally raises the amount of glucose in your blood. Carb counting is a method for keeping track of how many carbs you eat. Counting carbs is important to keep your blood glucose at a healthy level, especially if you use insulin or take certain oral diabetes medicines. It is important to know how many carbs you can safely have in each meal. This is different for every person. Your dietitian can help you calculate how many carbs you should have at each meal and for each snack. How does alcohol affect me? Alcohol can cause a sudden decrease in blood glucose (hypoglycemia), especially if you use insulin or take certain oral diabetes medicines. Hypoglycemia can be a life-threatening condition. Symptoms of hypoglycemia, such as sleepiness, dizziness, and confusion, are similar to symptoms of having too much  alcohol.  Do not drink alcohol if: ? Your health care provider tells you not to drink. ? You are pregnant, may be pregnant, or are planning to become pregnant.  If you drink alcohol: ? Do not drink on an empty stomach. ? Limit how much you use to:  0-1 drink a day for women.  0-2 drinks a day for men. ? Be aware of how much alcohol is in your drink. In the [place] one drink equals one 12 oz bottle of beer (355 mL), one 5 oz glass of wine (148 mL), or one 1 oz glass of hard liquor (44 mL). ? Keep yourself hydrated with water, diet soda, or unsweetened iced tea.  Keep in mind that regular soda, juice, and other mixers may contain a lot of sugar and must be counted as carbs. What are tips for following this plan? Reading food labels  Start by checking the serving size on the "Nutrition Facts" label of packaged foods and drinks. The amount of calories, carbs, fats, and other nutrients listed on the label is based on one serving of the item. Many items contain more than one serving per package.  Check the total grams (g) of carbs in one serving. You can calculate the number of servings of carbs in one serving by dividing the total carbs by 15. For example, if a food has 30 g of total carbs per serving, it would be equal to 2 servings of carbs.  Check the number of grams (g) of saturated fats and trans fats in one serving. Choose foods that have   a low amount or none of these fats.  Check the number of milligrams (mg) of salt (sodium) in one serving. Most people should limit total sodium intake to less than 2,300 mg per day.  Always check the nutrition information of foods labeled as "low-fat" or "nonfat." These foods may be higher in added sugar or refined carbs and should be avoided.  Talk to your dietitian to identify your daily goals for nutrients listed on the label. Shopping  Avoid buying canned, pre-made, or processed foods. These foods tend to be high in fat, sodium, and added  sugar.  Shop around the outside edge of the grocery store. This is where you will most often find fresh fruits and vegetables, bulk grains, fresh meats, and fresh dairy. Cooking  Use low-heat cooking methods, such as baking, instead of high-heat cooking methods like deep frying.  Cook using healthy oils, such as olive, canola, or sunflower oil.  Avoid cooking with butter, cream, or high-fat meats. Meal planning  Eat meals and snacks regularly, preferably at the same times every day. Avoid going long periods of time without eating.  Eat foods that are high in fiber, such as fresh fruits, vegetables, beans, and whole grains. Talk with your dietitian about how many servings of carbs you can eat at each meal.  Eat 4-6 oz (112-168 g) of lean protein each day, such as lean meat, chicken, fish, eggs, or tofu. One ounce (oz) of lean protein is equal to: ? 1 oz (28 g) of meat, chicken, or fish. ? 1 egg. ?  cup (62 g) of tofu.  Eat some foods each day that contain healthy fats, such as avocado, nuts, seeds, and fish.   What foods should I eat? Fruits Berries. Apples. Oranges. Peaches. Apricots. Plums. Grapes. Mango. Papaya. Pomegranate. Kiwi. Cherries. Vegetables Lettuce. Spinach. Leafy greens, including kale, chard, collard greens, and mustard greens. Beets. Cauliflower. Cabbage. Broccoli. Carrots. Green beans. Tomatoes. Peppers. Onions. Cucumbers. Brussels sprouts. Grains Whole grains, such as whole-wheat or whole-grain bread, crackers, tortillas, cereal, and pasta. Unsweetened oatmeal. Quinoa. Brown or wild rice. Meats and other proteins Seafood. Poultry without skin. Lean cuts of poultry and beef. Tofu. Nuts. Seeds. Dairy Low-fat or fat-free dairy products such as milk, yogurt, and cheese. The items listed above may not be a complete list of foods and beverages you can eat. Contact a dietitian for more information. What foods should I avoid? Fruits Fruits canned with  syrup. Vegetables Canned vegetables. Frozen vegetables with butter or cream sauce. Grains Refined white flour and flour products such as bread, pasta, snack foods, and cereals. Avoid all processed foods. Meats and other proteins Fatty cuts of meat. Poultry with skin. Breaded or fried meats. Processed meat. Avoid saturated fats. Dairy Full-fat yogurt, cheese, or milk. Beverages Sweetened drinks, such as soda or iced tea. The items listed above may not be a complete list of foods and beverages you should avoid. Contact a dietitian for more information. Questions to ask a health care provider  Do I need to meet with a diabetes educator?  Do I need to meet with a dietitian?  What number can I call if I have questions?  When are the best times to check my blood glucose? Where to find more information:  American Diabetes Association: diabetes.org  Academy of Nutrition and Dietetics: www.eatright.org  National Institute of Diabetes and Digestive and Kidney Diseases: www.niddk.nih.gov  Association of Diabetes Care and Education Specialists: www.diabeteseducator.org Summary  It is important to have healthy eating   habits because your blood sugar (glucose) levels are greatly affected by what you eat and drink.  A healthy meal plan will help you control your blood glucose and maintain a healthy lifestyle.  Your health care provider may recommend that you work with a dietitian to make a meal plan that is best for you.  Keep in mind that carbohydrates (carbs) and alcohol have immediate effects on your blood glucose levels. It is important to count carbs and to use alcohol carefully. This information is not intended to replace advice given to you by your health care provider. Make sure you discuss any questions you have with your health care provider. Document Revised: 11/07/2019 Document Reviewed: 11/07/2019 Elsevier Patient Education  2021 Elsevier Inc.  

## 2021-03-20 NOTE — Telephone Encounter (Signed)
Completed forms. Will fax today

## 2021-03-21 ENCOUNTER — Ambulatory Visit: Payer: 59 | Admitting: Physician Assistant

## 2021-03-24 ENCOUNTER — Other Ambulatory Visit: Payer: Self-pay

## 2021-03-24 NOTE — Telephone Encounter (Signed)
Forms faxed 03/20/21. TNP

## 2021-03-25 ENCOUNTER — Other Ambulatory Visit: Payer: Self-pay

## 2021-03-27 ENCOUNTER — Other Ambulatory Visit: Payer: Self-pay

## 2021-04-13 DIAGNOSIS — I639 Cerebral infarction, unspecified: Secondary | ICD-10-CM

## 2021-04-13 HISTORY — DX: Cerebral infarction, unspecified: I63.9

## 2021-04-14 ENCOUNTER — Other Ambulatory Visit: Payer: Self-pay

## 2021-04-14 MED FILL — Alprazolam Tab 0.5 MG: ORAL | 15 days supply | Qty: 30 | Fill #0 | Status: AC

## 2021-04-15 ENCOUNTER — Other Ambulatory Visit: Payer: Self-pay

## 2021-04-18 ENCOUNTER — Inpatient Hospital Stay: Payer: 59

## 2021-04-18 ENCOUNTER — Emergency Department: Payer: 59

## 2021-04-18 ENCOUNTER — Encounter: Payer: Self-pay | Admitting: Emergency Medicine

## 2021-04-18 ENCOUNTER — Inpatient Hospital Stay
Admission: EM | Admit: 2021-04-18 | Discharge: 2021-04-19 | DRG: 062 | Disposition: A | Discharge status: Other Acute Inpatient Hospital | Payer: 59 | Attending: Pulmonary Disease | Admitting: Pulmonary Disease

## 2021-04-18 ENCOUNTER — Other Ambulatory Visit: Payer: Self-pay

## 2021-04-18 DIAGNOSIS — Z820 Family history of epilepsy and other diseases of the nervous system: Secondary | ICD-10-CM

## 2021-04-18 DIAGNOSIS — R29706 NIHSS score 6: Secondary | ICD-10-CM | POA: Diagnosis present

## 2021-04-18 DIAGNOSIS — G8194 Hemiplegia, unspecified affecting left nondominant side: Secondary | ICD-10-CM | POA: Diagnosis present

## 2021-04-18 DIAGNOSIS — Z79899 Other long term (current) drug therapy: Secondary | ICD-10-CM

## 2021-04-18 DIAGNOSIS — M1990 Unspecified osteoarthritis, unspecified site: Secondary | ICD-10-CM | POA: Diagnosis present

## 2021-04-18 DIAGNOSIS — I6623 Occlusion and stenosis of bilateral posterior cerebral arteries: Secondary | ICD-10-CM | POA: Diagnosis not present

## 2021-04-18 DIAGNOSIS — F32A Depression, unspecified: Secondary | ICD-10-CM | POA: Diagnosis present

## 2021-04-18 DIAGNOSIS — R4781 Slurred speech: Secondary | ICD-10-CM | POA: Diagnosis present

## 2021-04-18 DIAGNOSIS — N179 Acute kidney failure, unspecified: Secondary | ICD-10-CM | POA: Diagnosis present

## 2021-04-18 DIAGNOSIS — R200 Anesthesia of skin: Secondary | ICD-10-CM | POA: Diagnosis not present

## 2021-04-18 DIAGNOSIS — Z888 Allergy status to other drugs, medicaments and biological substances status: Secondary | ICD-10-CM

## 2021-04-18 DIAGNOSIS — K219 Gastro-esophageal reflux disease without esophagitis: Secondary | ICD-10-CM | POA: Diagnosis present

## 2021-04-18 DIAGNOSIS — Z8249 Family history of ischemic heart disease and other diseases of the circulatory system: Secondary | ICD-10-CM

## 2021-04-18 DIAGNOSIS — E669 Obesity, unspecified: Secondary | ICD-10-CM | POA: Diagnosis present

## 2021-04-18 DIAGNOSIS — Z96641 Presence of right artificial hip joint: Secondary | ICD-10-CM | POA: Diagnosis not present

## 2021-04-18 DIAGNOSIS — F419 Anxiety disorder, unspecified: Secondary | ICD-10-CM | POA: Diagnosis present

## 2021-04-18 DIAGNOSIS — I160 Hypertensive urgency: Secondary | ICD-10-CM | POA: Diagnosis not present

## 2021-04-18 DIAGNOSIS — I358 Other nonrheumatic aortic valve disorders: Secondary | ICD-10-CM | POA: Diagnosis not present

## 2021-04-18 DIAGNOSIS — R278 Other lack of coordination: Secondary | ICD-10-CM | POA: Diagnosis present

## 2021-04-18 DIAGNOSIS — Z87891 Personal history of nicotine dependence: Secondary | ICD-10-CM

## 2021-04-18 DIAGNOSIS — I6621 Occlusion and stenosis of right posterior cerebral artery: Secondary | ICD-10-CM | POA: Diagnosis not present

## 2021-04-18 DIAGNOSIS — Z800 Family history of malignant neoplasm of digestive organs: Secondary | ICD-10-CM

## 2021-04-18 DIAGNOSIS — I63419 Cerebral infarction due to embolism of unspecified middle cerebral artery: Secondary | ICD-10-CM | POA: Diagnosis not present

## 2021-04-18 DIAGNOSIS — I10 Essential (primary) hypertension: Secondary | ICD-10-CM | POA: Diagnosis present

## 2021-04-18 DIAGNOSIS — E7800 Pure hypercholesterolemia, unspecified: Secondary | ICD-10-CM | POA: Diagnosis present

## 2021-04-18 DIAGNOSIS — E785 Hyperlipidemia, unspecified: Secondary | ICD-10-CM | POA: Diagnosis present

## 2021-04-18 DIAGNOSIS — I6381 Other cerebral infarction due to occlusion or stenosis of small artery: Principal | ICD-10-CM | POA: Diagnosis present

## 2021-04-18 DIAGNOSIS — Z833 Family history of diabetes mellitus: Secondary | ICD-10-CM

## 2021-04-18 DIAGNOSIS — Z20822 Contact with and (suspected) exposure to covid-19: Secondary | ICD-10-CM | POA: Diagnosis present

## 2021-04-18 DIAGNOSIS — Z7984 Long term (current) use of oral hypoglycemic drugs: Secondary | ICD-10-CM

## 2021-04-18 DIAGNOSIS — R531 Weakness: Secondary | ICD-10-CM | POA: Diagnosis not present

## 2021-04-18 DIAGNOSIS — E1165 Type 2 diabetes mellitus with hyperglycemia: Secondary | ICD-10-CM | POA: Diagnosis present

## 2021-04-18 DIAGNOSIS — I63233 Cerebral infarction due to unspecified occlusion or stenosis of bilateral carotid arteries: Secondary | ICD-10-CM | POA: Diagnosis not present

## 2021-04-18 DIAGNOSIS — I639 Cerebral infarction, unspecified: Secondary | ICD-10-CM

## 2021-04-18 DIAGNOSIS — R42 Dizziness and giddiness: Secondary | ICD-10-CM | POA: Diagnosis not present

## 2021-04-18 DIAGNOSIS — Z6833 Body mass index (BMI) 33.0-33.9, adult: Secondary | ICD-10-CM

## 2021-04-18 DIAGNOSIS — I6389 Other cerebral infarction: Secondary | ICD-10-CM | POA: Diagnosis not present

## 2021-04-18 DIAGNOSIS — I161 Hypertensive emergency: Secondary | ICD-10-CM | POA: Diagnosis not present

## 2021-04-18 DIAGNOSIS — R9431 Abnormal electrocardiogram [ECG] [EKG]: Secondary | ICD-10-CM | POA: Diagnosis not present

## 2021-04-18 DIAGNOSIS — Z880 Allergy status to penicillin: Secondary | ICD-10-CM | POA: Diagnosis not present

## 2021-04-18 DIAGNOSIS — R29898 Other symptoms and signs involving the musculoskeletal system: Secondary | ICD-10-CM | POA: Diagnosis not present

## 2021-04-18 DIAGNOSIS — R059 Cough, unspecified: Secondary | ICD-10-CM | POA: Diagnosis not present

## 2021-04-18 HISTORY — DX: Hypertensive urgency: I16.0

## 2021-04-18 LAB — DIFFERENTIAL
Abs Immature Granulocytes: 0.03 10*3/uL (ref 0.00–0.07)
Basophils Absolute: 0.1 10*3/uL (ref 0.0–0.1)
Basophils Relative: 1 %
Eosinophils Absolute: 0.2 10*3/uL (ref 0.0–0.5)
Eosinophils Relative: 3 %
Immature Granulocytes: 0 %
Lymphocytes Relative: 25 %
Lymphs Abs: 2.3 10*3/uL (ref 0.7–4.0)
Monocytes Absolute: 0.7 10*3/uL (ref 0.1–1.0)
Monocytes Relative: 8 %
Neutro Abs: 5.7 10*3/uL (ref 1.7–7.7)
Neutrophils Relative %: 63 %

## 2021-04-18 LAB — CBC
HCT: 38 % (ref 36.0–46.0)
Hemoglobin: 12.9 g/dL (ref 12.0–15.0)
MCH: 28.5 pg (ref 26.0–34.0)
MCHC: 33.9 g/dL (ref 30.0–36.0)
MCV: 84.1 fL (ref 80.0–100.0)
Platelets: 259 10*3/uL (ref 150–400)
RBC: 4.52 MIL/uL (ref 3.87–5.11)
RDW: 12.9 % (ref 11.5–15.5)
WBC: 9 10*3/uL (ref 4.0–10.5)
nRBC: 0 % (ref 0.0–0.2)

## 2021-04-18 LAB — COMPREHENSIVE METABOLIC PANEL
ALT: 16 U/L (ref 0–44)
AST: 23 U/L (ref 15–41)
Albumin: 4 g/dL (ref 3.5–5.0)
Alkaline Phosphatase: 45 U/L (ref 38–126)
Anion gap: 10 (ref 5–15)
BUN: 25 mg/dL — ABNORMAL HIGH (ref 8–23)
CO2: 27 mmol/L (ref 22–32)
Calcium: 9 mg/dL (ref 8.9–10.3)
Chloride: 96 mmol/L — ABNORMAL LOW (ref 98–111)
Creatinine, Ser: 1.09 mg/dL — ABNORMAL HIGH (ref 0.44–1.00)
GFR, Estimated: 56 mL/min — ABNORMAL LOW (ref >60)
Glucose, Bld: 272 mg/dL — ABNORMAL HIGH (ref 70–99)
Potassium: 4.6 mmol/L (ref 3.5–5.1)
Sodium: 133 mmol/L — ABNORMAL LOW (ref 135–145)
Total Bilirubin: 0.6 mg/dL (ref 0.3–1.2)
Total Protein: 8 g/dL (ref 6.5–8.1)

## 2021-04-18 LAB — RESP PANEL BY RT-PCR (FLU A&B, COVID) ARPGX2
Influenza A by PCR: NEGATIVE
Influenza B by PCR: NEGATIVE
SARS Coronavirus 2 by RT PCR: NEGATIVE

## 2021-04-18 LAB — PROTIME-INR
INR: 1 (ref 0.8–1.2)
Prothrombin Time: 13.1 seconds (ref 11.4–15.2)

## 2021-04-18 LAB — CBG MONITORING, ED
Glucose-Capillary: 260 mg/dL — ABNORMAL HIGH (ref 70–99)
Glucose-Capillary: 277 mg/dL — ABNORMAL HIGH (ref 70–99)

## 2021-04-18 LAB — APTT: aPTT: 28 seconds (ref 24–36)

## 2021-04-18 MED ORDER — LABETALOL HCL 5 MG/ML IV SOLN
INTRAVENOUS | Status: AC
  Administered 2021-04-18: 10 mg via INTRAVENOUS
  Filled 2021-04-18: qty 4

## 2021-04-18 MED ORDER — PANTOPRAZOLE SODIUM 40 MG IV SOLR
40.0000 mg | Freq: Every day | INTRAVENOUS | Status: DC
  Administered 2021-04-18: 40 mg via INTRAVENOUS
  Filled 2021-04-18: qty 40

## 2021-04-18 MED ORDER — STROKE: EARLY STAGES OF RECOVERY BOOK: Freq: Once | Status: DC

## 2021-04-18 MED ORDER — INSULIN ASPART 100 UNIT/ML IJ SOLN
0.0000 [IU] | INTRAMUSCULAR | Status: DC
  Administered 2021-04-18: 11 [IU] via SUBCUTANEOUS
  Filled 2021-04-18: qty 1

## 2021-04-18 MED ORDER — ALTEPLASE (STROKE) FULL DOSE INFUSION
90.0000 mg | Freq: Once | INTRAVENOUS | Status: AC
  Administered 2021-04-18: 90 mg via INTRAVENOUS
  Filled 2021-04-18: qty 100

## 2021-04-18 MED ORDER — ALTEPLASE 100 MG IV SOLR
INTRAVENOUS | Status: AC
  Filled 2021-04-18: qty 100

## 2021-04-18 MED ORDER — ACETAMINOPHEN 650 MG RE SUPP: 650.0000 mg | RECTAL | Status: DC | PRN

## 2021-04-18 MED ORDER — SODIUM CHLORIDE 0.9 % IV SOLN: 50.0000 mL | Freq: Once | INTRAVENOUS | Status: DC

## 2021-04-18 MED ORDER — IOHEXOL 350 MG/ML SOLN
125.0000 mL | Freq: Once | INTRAVENOUS | Status: AC | PRN
  Administered 2021-04-18: 125 mL via INTRAVENOUS

## 2021-04-18 MED ORDER — ACETAMINOPHEN 160 MG/5ML PO SOLN
650.0000 mg | ORAL | Status: DC | PRN
  Filled 2021-04-18: qty 20.3

## 2021-04-18 MED ORDER — SODIUM CHLORIDE 0.9 % IV SOLN: INTRAVENOUS | Status: DC

## 2021-04-18 MED ORDER — CLEVIDIPINE BUTYRATE 0.5 MG/ML IV EMUL
0.0000 mg/h | INTRAVENOUS | Status: DC
  Administered 2021-04-18: 1 mg/h via INTRAVENOUS
  Filled 2021-04-18 (×2): qty 50

## 2021-04-18 MED ORDER — SODIUM CHLORIDE 0.9% FLUSH
3.0000 mL | Freq: Once | INTRAVENOUS | Status: AC
Start: 2021-04-18 — End: 2021-04-18
  Administered 2021-04-18: 3 mL via INTRAVENOUS

## 2021-04-18 MED ORDER — LABETALOL HCL 5 MG/ML IV SOLN
10.0000 mg | INTRAVENOUS | Status: DC | PRN
  Administered 2021-04-18: 10 mg via INTRAVENOUS

## 2021-04-18 MED ORDER — ACETAMINOPHEN 325 MG PO TABS: 650.0000 mg | ORAL_TABLET | ORAL | Status: DC | PRN

## 2021-04-18 MED ORDER — POLYETHYLENE GLYCOL 3350 17 G PO PACK: 17.0000 g | PACK | Freq: Every day | ORAL | Status: DC | PRN

## 2021-04-18 MED ORDER — DOCUSATE SODIUM 100 MG PO CAPS: 100.0000 mg | ORAL_CAPSULE | Freq: Two times a day (BID) | ORAL | Status: DC | PRN

## 2021-04-18 NOTE — H&P (Signed)
NAME:  [name], MRN:  [id number], DOB:  Feb 16, 1956, LOS: 0 ADMISSION DATE:  04/18/2021, CONSULTATION DATE:  04/18/2021 REFERRING MD:  Dr. Charna Archer, CHIEF COMPLAINT: Left sided weakness, off balance  History of Present Illness:  65 yo F presented to [facility] ED due to sudden onset of dizziness and difficulty using her left side. She was working at 18:00 in [facility] hospital on 2 C and was urgently brought to the ED due to concerns for stroke. ED course: Per ED documentation the patient reported her left arm as weak and numb and was having difficulty controlling it. Due to the prominent left upper extremity hemiataxia as well as slurred speech initially that has since resolved, CODE STROKE was initiated. Initial head CT negative for any acute intracranial abnormality. Dr. Curly Shores with neurology evaluated the patient and the decision was made for TPA. LKW at 18:00, patient hypertensive with SBP > 200 requiring labetalol and ultimately a clevidipine drip. TPA administered at 19:09.  CTa was not done since the exam was not consistent with an LVO.  Initial vitals: afebrile at 98.1, RR 21, NSR at 74, hypertensive urgency at 215/170 & SpO2 98% on room air. Significant labs: mild AKI with BUN/Cr. 25/ 1.09, Na: 133, Chloride: 96, hyperglycemic at 272, COVID-19 & Influenza negative.  PCCM consulted for admission. Pertinent  Medical History  T2DM HTN HLD Obesity Anxiety/depression  Significant Hospital Events: Including procedures, antibiotic start and stop dates in addition to other pertinent events   . 04/18/21 Admit to ICU as Acute Ischemic Stroke s/p TPA administration.  Interim History / Subjective:  Patient resting in bed, alert & responsive. Does admit to experiencing palpitations previously, but not recently. Still having obvious uncoordinated movement in LUE. She describes improved numbness/tingling in LUE to only fingertips at this time. No c/o pain, nausea, blurred vision. All questions answered at  this time.  NIHSS: 1A: Level of Consciousness - 0 1B: Ask Month and Age - 0 1C: 'Blink Eyes' & 'Squeeze Hands' - 0 2: Test Horizontal Extraocular Movements - 0 3: Test Visual Fields - 0 4: Test Facial Palsy - 0 5A: Test Left Arm Motor Drift - 1 5B: Test Right Arm Motor Drift - 0 6A: Test Left Leg Motor Drift - 1 6B: Test Right Leg Motor Drift - 0 7: Test Limb Ataxia - 2 8: Test Sensation - 1 9: Test Language/Aphasia- 0 10: Test Dysarthria - 0 11: Test Extinction/Inattention - 1 (LLE) NIHSS score: 6  Objective   Blood pressure (!) 154/66, pulse 76, temperature 98.1 F (36.7 C), resp. rate 20, height 5' 8"  (1.727 m), weight 99.8 kg, SpO2 95 %.        Intake/Output Summary (Last 24 hours) at 04/18/2021 2014 Last data filed at 04/18/2021 2010 Gross per 24 hour  Intake 81 ml  Output --  Net 81 ml   Filed Weights   04/18/21 1821  Weight: 99.8 kg    Examination: General: Adult female, critically ill, lying in bed, alert & responsive, NAD HEENT: MM pink/moist, anicteric, atraumatic, neck supple Neuro: A&O x 4, able to follow commands, Pupils: L-oval & unresponsive, @ baseline patient has less vision in this eye (from cataracts surgery per pt) R-round & brisk, MAE: uncoordinated mvmt with LUE CV: s1s2 RRR, NSR on monitor, no r/m/g Pulm: Regular, non labored on RA , breath sounds clear-BUL & diminished-BLL GI: soft, rounded, non tender, bs x 4 Skin: no rashes/lesions noted Extremities: warm/dry, pulses + 2 R/P, no edema noted,  LUE: 4/5, RUE: 5/5, LLE: 4/5, RLE: 5/5.  Labs/imaging that I have personally reviewed  (right click and "Reselect all SmartList Selections" daily)  EKG Interpretation Date: 04/18/21 EKG Time: 18:47 Rate: 82 Rhythm: NSR QRS Axis:  possible RAD Intervals: borderline prolonged Qtc ST/T Wave abnormalities: non specific T wave inversions in leads 1 & avL Narrative Interpretation: NSR, with T wave inversion in 1 & avL and borderline prolonged Qtc  Na+/ K+:  133/ 4.6 BUN/Cr.: 25/ 1.09 Serum CO2/ AG: 27/ 10  Hgb: 12.9 WBC/ TMAX: 9/ afebrile Resolved Hospital Problem list     Assessment & Plan:  Acute Ischemic Stroke s/p TPA administration PMHx: HTN, T2DM, HLD LKW: 1800, Tpa given 19:09, Modified Rankin Scale: 0. Per neurology exam not consistent with LVO, Initial NIHSS: 5 (LUE drift, LLE drift, limb ataxia, sensation) ASPECTS: 10. MRI & MRA of the brain revealed small right thalamic infarct & right PCA mid P 1 segment occlusion. - STAT CTa & Perfusion > discussed with Dr. Curly Shores & Dr. Malen Gauze at [facility], depending on results may transfer to [facility]. NIHSS now 6 due to additional neglect of LLE > this finding also discussed with Dr. Curly Shores. - Frequent Neuro checks per post TPA protocol - f/u lipid panel & Hemoglobin A1C - echocardiogram ordered - continuous cardiac monitoring - PT/OT/SLP consults ordered - Neurology following, appreciate input  Hypertensive Urgency PMHx: HTN - continue clevidipine drip to maintain BP < 180/105 - labetalol PRN - restart home regimen as patient stabilizes if kidney function allows  Acute Kidney Injury Baseline Cr: 0.87, Cr on admission:1.09 - Strict I/O's: alert provider if UOP < 0.5 mL/kg/hr - gentle IVF hydration  - Daily BMP, replace electrolytes PRN - Avoid nephrotoxic agents as able, ensure adequate renal perfusion - consider renal US  Type 2 Diabetes Mellitus Poorly Controlled Previous A1C 9.9 in 03/2021 - Q 4 CBG monitoring with moderate SSI - f/u A1c - consult to diabetes coordinator - follow ICU hypo/hyperglycemia protocol  Hyperlipidemia - f/u lipid panel - restart statin therapy once stabilized and passed swallow study  Best practice (right click and "Reselect all SmartList Selections" daily)  Diet:  NPO Pain/Anxiety/Delirium protocol (if indicated): No VAP protocol (if indicated): Not indicated DVT prophylaxis: Contraindicated GI prophylaxis: PPI Glucose control:  SSI Yes Central  venous access:  N/A Arterial line:  N/A Foley:  N/A Mobility:  bed rest  PT consulted: Yes Last date of multidisciplinary goals of care discussion 04/18/21 Code Status:  full code Disposition: ICU  Labs   CBC: Recent Labs  Lab 04/18/21 1821  WBC 9.0  NEUTROABS 5.7  HGB 12.9  HCT 38.0  MCV 84.1  PLT 409    Basic Metabolic Panel: Recent Labs  Lab 04/18/21 1821  NA 133*  K 4.6  CL 96*  CO2 27  GLUCOSE 272*  BUN 25*  CREATININE 1.09*  CALCIUM 9.0   GFR: Estimated Creatinine Clearance: 62.8 mL/min (A) (by C-G formula based on SCr of 1.09 mg/dL (H)). Recent Labs  Lab 04/18/21 1821  WBC 9.0    Liver Function Tests: Recent Labs  Lab 04/18/21 1821  AST 23  ALT 16  ALKPHOS 45  BILITOT 0.6  PROT 8.0  ALBUMIN 4.0   No results for input(s): LIPASE, AMYLASE in the last 168 hours. No results for input(s): AMMONIA in the last 168 hours.  ABG No results found for: PHART, PCO2ART, PO2ART, HCO3, TCO2, ACIDBASEDEF, O2SAT   Coagulation Profile: Recent Labs  Lab 04/18/21 1821  INR  1.0    Cardiac Enzymes: No results for input(s): CKTOTAL, CKMB, CKMBINDEX, TROPONINI in the last 168 hours.  HbA1C: Hemoglobin A1C  Date/Time Value Ref Range Status  03/20/2021 10:38 AM 9.9 (A) 4.0 - 5.6 % Final  09/06/2013 03:18 PM 6.7 (H) 4.2 - 6.3 % Final    Comment:    The American Diabetes Association recommends that a primary goal of therapy should be <7% and that physicians should reevaluate the treatment regimen in patients with HbA1c values consistently >8%.    Hgb A1c MFr Bld  Date/Time Value Ref Range Status  09/20/2020 11:32 AM 7.9 (H) 4.8 - 5.6 % Final    Comment:             Prediabetes: 5.7 - 6.4          Diabetes: >6.4          Glycemic control for adults with diabetes: <7.0   08/24/2019 02:52 PM 12.6 (H) 4.8 - 5.6 % Final    Comment:             Prediabetes: 5.7 - 6.4          Diabetes: >6.4          Glycemic control for adults with diabetes: <7.0      CBG: Recent Labs  Lab 04/18/21 1843 04/18/21 2005  GLUCAP 260* 277*    Review of Systems: Positives in BOLD  Gen: Denies fever, chills, weight change, fatigue, night sweats HEENT: Denies blurred vision, double vision, hearing loss, tinnitus, sinus congestion, rhinorrhea, sore throat, neck stiffness, dysphagia PULM: Denies shortness of breath, cough, sputum production, hemoptysis, wheezing CV: Denies chest pain, edema, orthopnea, paroxysmal nocturnal dyspnea, palpitations GI: Denies abdominal pain, nausea, vomiting, diarrhea, hematochezia, melena, constipation, change in bowel habits GU: Denies dysuria, hematuria, polyuria, oliguria, urethral discharge Endocrine: Denies hot or cold intolerance, polyuria, polyphagia or appetite change Derm: Denies rash, dry skin, scaling or peeling skin change Heme: Denies easy bruising, bleeding, bleeding gums Neuro: Denies headache, numbness, dizziness, difficulty controlling LUE, weakness, slurred speech, loss of memory or consciousness  Past Medical History:  She,  has a past medical history of Anxiety, Arthritis, Cataract, Depression, Diabetes mellitus without complication ([place]), Environmental and seasonal allergies, GERD (gastroesophageal reflux disease), Hyperlipidemia, Hypertension, Joint pain, and Urinary incontinence.   Surgical History:   Past Surgical History:  Procedure Laterality Date  . BREAST EXCISIONAL BIOPSY Right 1999   neg  . BREAST LUMPECTOMY Right 2005   as reported by patient  . CATARACT EXTRACTION  January 2013  . CATARACT EXTRACTION W/PHACO Left 12/02/2016   Procedure: CATARACT EXTRACTION PHACO AND INTRAOCULAR LENS PLACEMENT (IOC);  Surgeon: Estill Cotta, MD;  Location: [facility] ORS;  Service: Ophthalmology;  Laterality: Left;  [place] 2:14 AP% 28.4 CDE 59.73 Fluid pack lot # [id number]  . DIAGNOSTIC LAPAROSCOPY    . TOTAL HIP ARTHROPLASTY Right 04/04/2018   Procedure: TOTAL HIP ARTHROPLASTY;  Surgeon: Dereck Leep, MD;   Location: [facility] ORS;  Service: Orthopedics;  Laterality: Right;     Social History:   reports that she quit smoking about 14 years ago. Her smoking use included cigarettes. She has a 37.50 pack-year smoking history. She has never used smokeless tobacco. She reports current alcohol use. She reports that she does not use drugs.   Family History:  Her family history includes Alzheimer's disease in her paternal grandmother; Aneurysm in her mother; Bone cancer in her brother; CAD in her father; Cancer in her father; Colon cancer in  her father; Dementia in her paternal grandmother; Diabetes in her father; Healthy in her daughter and son; Heart failure in her father; Mental illness in her paternal grandfather. There is no history of Breast cancer.   Allergies Allergies  Allergen Reactions  . Jardiance [Empagliflozin] Itching    Recurrent mycotic infections  . Penicillins Rash and Other (See Comments)    Has patient had a PCN reaction causing immediate rash, facial/tongue/throat swelling, SOB or lightheadedness with hypotension: No Has patient had a PCN reaction causing severe rash involving mucus membranes or skin necrosis: No Has patient had a PCN reaction that required hospitalization No Has patient had a PCN reaction occurring within the last 10 years: No If all of the above answers are "NO", then may proceed with Cephalosporin use.      Home Medications  Prior to Admission medications   Medication Sig Start Date End Date Taking? Authorizing Provider  ALPRAZolam (XANAX) 0.5 MG tablet TAKE 1 TABLET BY MOUTH 2 TIMES DAILY AS NEEDED FOR ANXIETY. 03/10/21 09/06/21  Mar Daring, PA-C  Blood Glucose Monitoring Suppl (ACCU-CHEK GUIDE) w/Device KIT Use to check blood sugar twice daily 03/20/21   Mar Daring, PA-C  doxycycline (VIBRA-TABS) 100 MG tablet Take 1 tablet (100 mg total) by mouth 2 (two) times daily. 03/20/21   Mar Daring, PA-C  glipiZIDE (GLUCOTROL XL) 5 MG 24 hr  tablet TAKE 1 TABLET BY MOUTH DAILY WITH BREAKFAST. 03/20/21 03/20/22  Mar Daring, PA-C  glucose blood test strip CHECK BLOOD SUGAR TWICE DAILY 03/20/21   Mar Daring, PA-C  hydrochlorothiazide (HYDRODIURIL) 25 MG tablet TAKE 1 TABLET BY MOUTH DAILY. 03/20/21 03/20/22  Mar Daring, PA-C  Lancets (FREESTYLE) lancets CHECK BLOOD SUGAR TWICE DAILY 06/13/20   Mar Daring, PA-C  losartan (COZAAR) 25 MG tablet TAKE 1 TABLET BY MOUTH TWICE DAILY 03/20/21 03/20/22  Mar Daring, PA-C  meclizine (ANTIVERT) 25 MG tablet TAKE 1 TABLET (25 MG TOTAL) BY MOUTH 3 (THREE) TIMES DAILY AS NEEDED FOR DIZZINESS. 03/20/21 03/20/22  Mar Daring, PA-C  metFORMIN (GLUCOPHAGE) 1000 MG tablet TAKE 1 TABLET BY MOUTH 2 TIMES DAILY WITH A MEAL. 03/20/21 03/20/22  Mar Daring, PA-C  Multiple Vitamin (MULTIVITAMIN) tablet Take 1 tablet by mouth daily.    [provider]  pioglitazone (ACTOS) 45 MG tablet TAKE 1 TABLET BY MOUTH DAILY. 03/20/21 03/20/22  Mar Daring, PA-C  rOPINIRole (REQUIP) 1 MG tablet TAKE 1 TABLET BY MOUTH AT BEDTIME. 03/20/21 03/20/22  Mar Daring, PA-C  sertraline (ZOLOFT) 100 MG tablet TAKE 1 TABLET BY MOUTH DAILY. 03/20/21 03/20/22  Mar Daring, PA-C  simvastatin (ZOCOR) 20 MG tablet Take 1 tablet (20 mg total) by mouth at bedtime. 03/20/21   Mar Daring, PA-C  sitaGLIPtin (JANUVIA) 100 MG tablet TAKE 1 TABLET BY MOUTH DAILY. 03/20/21 03/20/22  Mar Daring, PA-C     Critical care time: 47 minutes       Venetia Night, AGACNP-BC Acute Care Nurse Practitioner [facility]   [phone] / [phone] Please see Amion for pager details.

## 2021-04-18 NOTE — ED Notes (Signed)
Pt assisted to MRI suite, placed on MRI safe equipment and IV pump, VSS at this time, unable to perform full neuro check d/t MRI in process, pt a/o x 4 GCS 15 on exam, no new neuro changes noted at this time

## 2021-04-18 NOTE — ED Notes (Signed)
Report called to Romilda Joy, RN, pending transport to MRI at this time

## 2021-04-18 NOTE — ED Triage Notes (Signed)
Pt comes into the ED via rapid response in the hospital for possible stroke like symptoms including sudden dizziness and left side weakness.  Pt has drift and uncontrolled movement in the left arm.  LKW at 18:00 tonight.

## 2021-04-18 NOTE — Progress Notes (Signed)
PHARMACY CONSULT NOTE - FOLLOW UP  Pharmacy Consult for Electrolyte Monitoring and Replacement   Recent Labs: Potassium (mmol/L)  Date Value  04/18/2021 4.6  09/06/2013 4.4   Calcium (mg/dL)  Date Value  04/18/2021 9.0   Calcium, Total (mg/dL)  Date Value  09/06/2013 9.2   Albumin (g/dL)  Date Value  04/18/2021 4.0  09/20/2020 4.4  09/06/2013 4.1   Sodium (mmol/L)  Date Value  04/18/2021 133 (L)  09/20/2020 135  09/06/2013 135 (L)     Assessment: 65 yo female admitted for code stroke. S/P TPA [id number]. Pharmacy has been consulted for electrolyte monitoring.   Goal of Therapy:  Electrolytes WNL  Plan:  No replacement warranted at this time. Will order Mg level with AM labs.  Pharmacy will continue to monitor and replace electrolytes as needed.   Pernell Dupre, PharmD, BCPS Clinical Pharmacist 04/18/2021 8:53 PM

## 2021-04-18 NOTE — Consult Note (Addendum)
[facility] Telemedicine Consult  Requesting Provider: Blake Divine Consult Participants: Patient, atrium nurse, bedside nursing, Dr. Charna Archer Location of the provider: [place], [place] Location of the patient: [facility]   This consult was provided via telemedicine with 2-way video and audio communication. The patient/family was informed that care would be provided in this way and agreed to receive care in this manner.   Chief Complaint: Left sided weakness, off balance  HPI:   [name] is a 65 year old woman, right-hand-dominant, with a past medical history significant for poorly controlled diabetes, hypertension, hyperlipidemia, obesity, right hip replacement, anxiety/depression, presenting with acute onset dizziness, left-sided weakness, left-sided numbness.  She was working at 6 PM in the [facility] hospital when she suddenly began to feel dizzy and had trouble with her left side.  She was taken to the ED and code stroke was activated for prominent left hemiataxia.  She additionally had some slurred speech initially but this was resolving by the time of my evaluation.  She confirmed that she had no contraindications to tPA on review of the checklist, risks and benefits of administration were explained to her, and she agreed to proceed with this medication.  LKW: 18:00 PM tpa given?: Yes 19:09 start, bolus completed at 19:10, infusion completed subsequently. Slight delay for BP control IR Thrombectomy? No, exam inconsistent with LVO  Modified Rankin Scale: 0-Completely asymptomatic and back to baseline post- stroke Time of teleneurologist evaluation: 18:40   Past Medical History:  Diagnosis Date  . Anxiety   . Arthritis   . Cataract    left  . Depression   . Diabetes mellitus without complication ([place])   . Environmental and seasonal allergies   . GERD (gastroesophageal reflux disease)   . Hyperlipidemia   . Hypertension   . Joint pain    as reported by patient  . Urinary  incontinence    as stated by patient   Past Surgical History:  Procedure Laterality Date  . BREAST EXCISIONAL BIOPSY Right 1999   neg  . BREAST LUMPECTOMY Right 2005   as reported by patient  . CATARACT EXTRACTION  January 2013  . CATARACT EXTRACTION W/PHACO Left 12/02/2016   Procedure: CATARACT EXTRACTION PHACO AND INTRAOCULAR LENS PLACEMENT (IOC);  Surgeon: Estill Cotta, MD;  Location: [facility] ORS;  Service: Ophthalmology;  Laterality: Left;  [place] 2:14 AP% 28.4 CDE 59.73 Fluid pack lot # [id number]  . DIAGNOSTIC LAPAROSCOPY    . TOTAL HIP ARTHROPLASTY Right 04/04/2018   Procedure: TOTAL HIP ARTHROPLASTY;  Surgeon: Dereck Leep, MD;  Location: [facility] ORS;  Service: Orthopedics;  Laterality: Right;    Current Outpatient Medications  Medication Instructions  . ALPRAZolam (XANAX) 0.5 MG tablet TAKE 1 TABLET BY MOUTH 2 TIMES DAILY AS NEEDED FOR ANXIETY.  . Blood Glucose Monitoring Suppl (ACCU-CHEK GUIDE) w/Device KIT Use to check blood sugar twice daily  . doxycycline (VIBRA-TABS) 100 mg, Oral, 2 times daily  . glipiZIDE (GLUCOTROL XL) 5 MG 24 hr tablet TAKE 1 TABLET BY MOUTH DAILY WITH BREAKFAST.  Marland Kitchen glucose blood test strip CHECK BLOOD SUGAR TWICE DAILY  . hydrochlorothiazide (HYDRODIURIL) 25 MG tablet TAKE 1 TABLET BY MOUTH DAILY.  Marland Kitchen Lancets (FREESTYLE) lancets CHECK BLOOD SUGAR TWICE DAILY  . losartan (COZAAR) 25 MG tablet TAKE 1 TABLET BY MOUTH TWICE DAILY  . meclizine (ANTIVERT) 25 MG tablet TAKE 1 TABLET (25 MG TOTAL) BY MOUTH 3 (THREE) TIMES DAILY AS NEEDED FOR DIZZINESS.  . metFORMIN (GLUCOPHAGE) 1000 MG tablet TAKE 1 TABLET BY MOUTH  2 TIMES DAILY WITH A MEAL.  . Multiple Vitamin (MULTIVITAMIN) tablet 1 tablet, Oral, Daily  . pioglitazone (ACTOS) 45 MG tablet TAKE 1 TABLET BY MOUTH DAILY.  Marland Kitchen rOPINIRole (REQUIP) 1 MG tablet TAKE 1 TABLET BY MOUTH AT BEDTIME.  Marland Kitchen sertraline (ZOLOFT) 100 MG tablet TAKE 1 TABLET BY MOUTH DAILY.  . simvastatin (ZOCOR) 20 mg, Oral, Daily at bedtime   . sitaGLIPtin (JANUVIA) 100 MG tablet TAKE 1 TABLET BY MOUTH DAILY.     Exam: Vitals:   04/18/21 1846  BP: (!) 203/98  Pulse: 84  Temp: 98.1 F (36.7 C)  SpO2: 97%    General: No acute distress  Pulmonary: breathing comfortably Cardiac: regular rate and rhythm on monitor   NIH Stroke scale 1A: Level of Consciousness - 0 1B: Ask Month and Age - 0 1C: 'Blink Eyes' & 'Squeeze Hands' - 0 2: Test Horizontal Extraocular Movements - 0 3: Test Visual Fields - 0 4: Test Facial Palsy - 0 5A: Test Left Arm Motor Drift - 1 5B: Test Right Arm Motor Drift - 0 6A: Test Left Leg Motor Drift - 1 6B: Test Right Leg Motor Drift - 0 7: Test Limb Ataxia - 2 8: Test Sensation - 1 9: Test Language/Aphasia- 0 10: Test Dysarthria - 0 11: Test Extinction/Inattention - 0 NIHSS score: 5   Imaging Reviewed:  Head CT no acute process, ASPECTS 10   Labs reviewed in epic and pertinent values follow: Creatinine 1.09 Glucose 272   Assessment: Likely lacunar stroke, suspect clumsy hand dysarthria syndrome, in the setting of uncontrolled small vessel risk factors.  Plan as below  Recommendations:   # Lacunar stroke based on clinical assessment - Stroke labs HgbA1c, fasting lipid panel - MRI brain (ordered) - MRA of the brain without contrast and MRA neck w/wo or CTA  - Frequent neuro checks - Echocardiogram - No antiplatelets until 24 hours post tPA if patient remains stable and imaging stable  - Risk factor modification, diet exercise and weight loss, consider OSA eval - Telemetry monitoring - Blood pressure goal -- clevidipine and labetalol ordered  - Post tPA for 24  hours < 180/105 - PT consult, OT consult, Speech consult - Neurology team to follow  CRITICAL CARE Performed by: [name]  Total critical care time: 45 minutes  Critical care time was exclusive of separately billable procedures and treating other patients.  Critical care was necessary to treat or prevent  imminent or life-threatening deterioration. This patient is receiving care for acute neurological changes, clinically determined to have a stroke and given tPA.   Critical care was time spent personally by me on the following activities: development of treatment plan with patient and/or surrogate as well as nursing, discussions with consultants, evaluation of patient's response to treatment, examination of patient, obtaining history from patient or surrogate, ordering and performing treatments and interventions, ordering and review of laboratory studies, ordering and review of radiographic studies, pulse oximetry and re-evaluation of patient's condition.  Lesleigh Noe MD-PhD [facility] [phone] If 8pm-8am, please page neurology on call as listed in [place].

## 2021-04-18 NOTE — ED Notes (Signed)
Pt started on TPA at [place], bolus finished 1910; pt currently on cleviprex @ 3 mg/hr and TPA infusion of 81 mg currently running, pt a/o x 4, GCS 15, no neuro changes per offgoing nurse

## 2021-04-18 NOTE — ED Provider Notes (Signed)
[facility] Emergency Department Provider Note   ____________________________________________   Event Date/Time   First MD Initiated Contact with Patient 04/18/21 1822     (approximate)  I have reviewed the triage vital signs and the nursing notes.   HISTORY  Chief Complaint Code Stroke    HPI [name] is a 65 y.o. female with possible history of hypertension, hyperlipidemia, diabetes who presents to the ED for possible stroke.  Patient is an employee at the hospital and states that shortly after she got to work she went to sit at her computer around 6 PM and started to notice difficulty moving her left arm.  She states it feels weak and numb in the arm and she is having trouble controlling it.  She has similar symptoms in her left leg but not as severe.  She denies any vision changes, speech changes, numbness, or weakness.  She has never had similar symptoms in the past and denies any neck pain.  She is otherwise felt well with no fevers, cough, chest pain, or shortness of breath.        Past Medical History:  Diagnosis Date  . Anxiety   . Arthritis   . Cataract    left  . Depression   . Diabetes mellitus without complication ([place])   . Environmental and seasonal allergies   . GERD (gastroesophageal reflux disease)   . Hyperlipidemia   . Hypertension   . Joint pain    as reported by patient  . Urinary incontinence    as stated by patient    Patient Active Problem List   Diagnosis Date Noted  . Ischemic stroke ([place]) 04/18/2021  . Recurrent major depressive disorder, in partial remission ([place]) 09/20/2020  . Status post total replacement of hip 04/04/2018  . Primary osteoarthritis of right hip 02/20/2018  . Allergic rhinitis 10/28/2015  . Acute stress disorder 08/01/2015  . Anxiety 08/01/2015  . Clinical depression 08/01/2015  . Diabetes ([place]) 08/01/2015  . Diverticulosis of colon 08/01/2015  . Generalized pruritus 08/01/2015  . BP  (high blood pressure) 08/01/2015  . Cannot sleep 08/01/2015  . Adiposity 08/01/2015  . Detrusor muscle hypertonia 08/01/2015  . Hypercholesterolemia without hypertriglyceridemia 08/01/2015  . Female stress incontinence 08/01/2015  . Avitaminosis D 08/01/2015    Past Surgical History:  Procedure Laterality Date  . BREAST EXCISIONAL BIOPSY Right 1999   neg  . BREAST LUMPECTOMY Right 2005   as reported by patient  . CATARACT EXTRACTION  January 2013  . CATARACT EXTRACTION W/PHACO Left 12/02/2016   Procedure: CATARACT EXTRACTION PHACO AND INTRAOCULAR LENS PLACEMENT (IOC);  Surgeon: Estill Cotta, MD;  Location: [facility] ORS;  Service: Ophthalmology;  Laterality: Left;  [place] 2:14 AP% 28.4 CDE 59.73 Fluid pack lot # [id number]  . DIAGNOSTIC LAPAROSCOPY    . TOTAL HIP ARTHROPLASTY Right 04/04/2018   Procedure: TOTAL HIP ARTHROPLASTY;  Surgeon: Dereck Leep, MD;  Location: [facility] ORS;  Service: Orthopedics;  Laterality: Right;    Prior to Admission medications   Medication Sig Start Date End Date Taking? Authorizing Provider  ALPRAZolam (XANAX) 0.5 MG tablet TAKE 1 TABLET BY MOUTH 2 TIMES DAILY AS NEEDED FOR ANXIETY. 03/10/21 09/06/21  Mar Daring, PA-C  Blood Glucose Monitoring Suppl (ACCU-CHEK GUIDE) w/Device KIT Use to check blood sugar twice daily 03/20/21   Mar Daring, PA-C  doxycycline (VIBRA-TABS) 100 MG tablet Take 1 tablet (100 mg total) by mouth 2 (two) times daily. 03/20/21   [place],  Clearnce Sorrel, PA-C  glipiZIDE (GLUCOTROL XL) 5 MG 24 hr tablet TAKE 1 TABLET BY MOUTH DAILY WITH BREAKFAST. 03/20/21 03/20/22  Mar Daring, PA-C  glucose blood test strip CHECK BLOOD SUGAR TWICE DAILY 03/20/21   Mar Daring, PA-C  hydrochlorothiazide (HYDRODIURIL) 25 MG tablet TAKE 1 TABLET BY MOUTH DAILY. 03/20/21 03/20/22  Mar Daring, PA-C  Lancets (FREESTYLE) lancets CHECK BLOOD SUGAR TWICE DAILY 06/13/20   Mar Daring, PA-C  losartan (COZAAR) 25 MG tablet TAKE 1  TABLET BY MOUTH TWICE DAILY 03/20/21 03/20/22  Mar Daring, PA-C  meclizine (ANTIVERT) 25 MG tablet TAKE 1 TABLET (25 MG TOTAL) BY MOUTH 3 (THREE) TIMES DAILY AS NEEDED FOR DIZZINESS. 03/20/21 03/20/22  Mar Daring, PA-C  metFORMIN (GLUCOPHAGE) 1000 MG tablet TAKE 1 TABLET BY MOUTH 2 TIMES DAILY WITH A MEAL. 03/20/21 03/20/22  Mar Daring, PA-C  Multiple Vitamin (MULTIVITAMIN) tablet Take 1 tablet by mouth daily.    [provider]  pioglitazone (ACTOS) 45 MG tablet TAKE 1 TABLET BY MOUTH DAILY. 03/20/21 03/20/22  Mar Daring, PA-C  rOPINIRole (REQUIP) 1 MG tablet TAKE 1 TABLET BY MOUTH AT BEDTIME. 03/20/21 03/20/22  Mar Daring, PA-C  sertraline (ZOLOFT) 100 MG tablet TAKE 1 TABLET BY MOUTH DAILY. 03/20/21 03/20/22  Mar Daring, PA-C  simvastatin (ZOCOR) 20 MG tablet Take 1 tablet (20 mg total) by mouth at bedtime. 03/20/21   Mar Daring, PA-C  sitaGLIPtin (JANUVIA) 100 MG tablet TAKE 1 TABLET BY MOUTH DAILY. 03/20/21 03/20/22  Mar Daring, PA-C    Allergies Jardiance [empagliflozin] and Penicillins  Family History  Problem Relation Age of Onset  . Bone cancer Brother   . Aneurysm Mother        brain  . Diabetes Father   . CAD Father   . Heart failure Father   . Colon cancer Father   . Cancer Father   . Alzheimer's disease Paternal Grandmother   . Dementia Paternal Grandmother   . Mental illness Paternal Grandfather   . Healthy Daughter   . Healthy Son   . Breast cancer Neg Hx     Social History Social History   Tobacco Use  . Smoking status: Former Smoker    Packs/day: 1.50    Years: 25.00    Pack years: 37.50    Types: Cigarettes    Quit date: 03/24/2007    Years since quitting: 14.0  . Smokeless tobacco: Never Used  Vaping Use  . Vaping Use: Never used  Substance Use Topics  . Alcohol use: Yes    Alcohol/week: 0.0 standard drinks    Comment: 1- every 3-4 months  . Drug use: No    Review of  Systems  Constitutional: No fever/chills Eyes: No visual changes. ENT: No sore throat. Cardiovascular: Denies chest pain. Respiratory: Denies shortness of breath. Gastrointestinal: No abdominal pain.  No nausea, no vomiting.  No diarrhea.  No constipation. Genitourinary: Negative for dysuria. Musculoskeletal: Negative for back pain. Skin: Negative for rash. Neurological: Negative for headaches, positive for left arm weakness.  ____________________________________________   PHYSICAL EXAM:  VITAL SIGNS: ED Triage Vitals  Enc Vitals Group     BP --      Pulse --      Resp --      Temp --      Temp src --      SpO2 --      Weight 04/18/21 1821 220 lb (99.8 kg)  Height 04/18/21 1821 5' 8"  (1.727 m)     Head Circumference --      Peak Flow --      Pain Score 04/18/21 1819 0     Pain Loc --      Pain Edu? --      Excl. in [place]? --     Constitutional: Alert and oriented. Eyes: Conjunctivae are normal. Head: Atraumatic. Nose: No congestion/rhinnorhea. Mouth/Throat: Mucous membranes are moist. Neck: Normal ROM Cardiovascular: Normal rate, regular rhythm. Grossly normal heart sounds. Respiratory: Normal respiratory effort.  No retractions. Lungs CTAB. Gastrointestinal: Soft and nontender. No distention. Genitourinary: deferred Musculoskeletal: No lower extremity tenderness nor edema. Neurologic:  Normal speech and language.  4 out of 5 strength in left upper extremity, 5 out of 5 strength in right upper extremity and bilateral lower extremities.  Dysmetria noted with finger-nose testing on left upper extremity and heel-to-shin testing in left lower extremity.  Coordination intact in right upper and lower extremities. Skin:  Skin is warm, dry and intact. No rash noted. Psychiatric: Mood and affect are normal. Speech and behavior are normal.  ____________________________________________   LABS (all labs ordered are listed, but only abnormal results are displayed)  Labs  Reviewed  COMPREHENSIVE METABOLIC PANEL - Abnormal; Notable for the following components:      Result Value   Sodium 133 (*)    Chloride 96 (*)    Glucose, Bld 272 (*)    BUN 25 (*)    Creatinine, Ser 1.09 (*)    GFR, Estimated 56 (*)    All other components within normal limits  CBG MONITORING, ED - Abnormal; Notable for the following components:   Glucose-Capillary 260 (*)    All other components within normal limits  CBG MONITORING, ED - Abnormal; Notable for the following components:   Glucose-Capillary 277 (*)    All other components within normal limits  RESP PANEL BY RT-PCR (FLU A&B, COVID) ARPGX2  PROTIME-INR  APTT  CBC  DIFFERENTIAL  HIV ANTIBODY (ROUTINE TESTING W REFLEX)  HEMOGLOBIN A1C  LIPID PANEL  CBC  BASIC METABOLIC PANEL  MAGNESIUM  PHOSPHORUS  I-STAT CREATININE, ED   ____________________________________________  EKG  ED ECG REPORT I, Blake Divine, the attending physician, personally viewed and interpreted this ECG.   Date: 04/18/2021  EKG Time: 18:47  Rate: 82  Rhythm: normal sinus rhythm  Axis: Normal  Intervals:none  ST&T Change: None   PROCEDURES  Procedure(s) performed (including Critical Care):  .Critical Care Performed by: Blake Divine, MD Authorized by: Blake Divine, MD   Critical care provider statement:    Critical care time (minutes):  45   Critical care time was exclusive of:  Separately billable procedures and treating other patients and teaching time   Critical care was necessary to treat or prevent imminent or life-threatening deterioration of the following conditions:  CNS failure or compromise   Critical care was time spent personally by me on the following activities:  Discussions with consultants, evaluation of patient's response to treatment, examination of patient, ordering and performing treatments and interventions, ordering and review of laboratory studies, ordering and review of radiographic studies, pulse  oximetry, re-evaluation of patient's condition, obtaining history from patient or surrogate and review of old charts   I assumed direction of critical care for this patient from another provider in my specialty: no     Care discussed with: admitting provider       ____________________________________________   [place] / [place] /  ED COURSE       65 year old female with past medical history of hypertension, hyperlipidemia, and diabetes who presents to the ED for cute onset difficulty moving her left arm and difficulty with coordination since 6 PM this evening.  Code stroke was called and patient taken to CT, which is negative for acute process.  Patient was evaluated by neurology and given her acute symptoms decision was made to proceed with tPA following discussion of risks and benefits with patient.  Symptoms do not appear consistent with large vessel occlusion and we will hold off on CTA.  Patient did require blood pressure control given BP elevated greater than 073 systolic.  She was given a push dose of labetalol and started on clevidipine drip per neurology.  We will maintain BP less than 543 systolic and 014 diastolic but otherwise allow for permissive hypertension.  Case discussed with ICU provider for admission.      ____________________________________________   FINAL CLINICAL IMPRESSION(S) / ED DIAGNOSES  Final diagnoses:  Cerebrovascular accident (CVA), unspecified mechanism ([place])  Cerebellar dysmetria     ED Discharge Orders    None       Note:  This document was prepared using Dragon voice recognition software and may include unintentional dictation errors.   Blake Divine, MD 04/18/21 2027

## 2021-04-18 NOTE — ED Notes (Signed)
Pt taken to MRI at this time with RN monitoring, ED charge RN aware of pt off unit

## 2021-04-18 NOTE — ED Notes (Signed)
Per neurology, target BP <353 systolic, < 614 diastolic; medication order updated

## 2021-04-18 NOTE — Progress Notes (Signed)
   04/18/21 1810  Clinical Encounter Type  Visited With Patient  Visit Type Initial;Spiritual support;Social support  Referral From Nurse  Consult/Referral To Chaplain   Chaplain responded to a rapid response page. The PT was actively in pain and was taken by nurses from [facility] hallway to the ED. The Chaplain checked on PT and ministered with presence from the hallway, because the PT was actively being assessed in the ED. The Chaplain will try to follow up later.

## 2021-04-19 ENCOUNTER — Inpatient Hospital Stay (HOSPITAL_COMMUNITY)
Admission: EM | Admit: 2021-04-19 | Discharge: 2021-04-19 | Disposition: A | Discharge status: Home / Self Care | Payer: 59 | Source: Other Acute Inpatient Hospital | Attending: Student in an Organized Health Care Education/Training Program | Admitting: Student in an Organized Health Care Education/Training Program

## 2021-04-19 ENCOUNTER — Inpatient Hospital Stay (HOSPITAL_COMMUNITY)
Admission: EM | Admit: 2021-04-19 | Discharge: 2021-04-29 | DRG: 023 | Disposition: A | Discharge status: Rehabilitation Facility | Payer: 59 | Source: Other Acute Inpatient Hospital | Attending: Neurology | Admitting: Neurology

## 2021-04-19 ENCOUNTER — Inpatient Hospital Stay (HOSPITAL_COMMUNITY): Payer: 59 | Admitting: Anesthesiology

## 2021-04-19 ENCOUNTER — Inpatient Hospital Stay (HOSPITAL_COMMUNITY): Payer: 59

## 2021-04-19 ENCOUNTER — Encounter (HOSPITAL_COMMUNITY)
Admission: EM | Disposition: A | Discharge status: Rehabilitation Facility | Payer: Self-pay | Source: Other Acute Inpatient Hospital | Attending: Neurology

## 2021-04-19 ENCOUNTER — Encounter (HOSPITAL_COMMUNITY): Payer: Self-pay | Admitting: Interventional Radiology

## 2021-04-19 ENCOUNTER — Encounter (HOSPITAL_COMMUNITY): Payer: Self-pay

## 2021-04-19 ENCOUNTER — Other Ambulatory Visit (HOSPITAL_COMMUNITY): Payer: Self-pay | Admitting: Student in an Organized Health Care Education/Training Program

## 2021-04-19 DIAGNOSIS — M1990 Unspecified osteoarthritis, unspecified site: Secondary | ICD-10-CM | POA: Diagnosis present

## 2021-04-19 DIAGNOSIS — R29810 Facial weakness: Secondary | ICD-10-CM | POA: Diagnosis present

## 2021-04-19 DIAGNOSIS — I700 Atherosclerosis of aorta: Secondary | ICD-10-CM | POA: Diagnosis present

## 2021-04-19 DIAGNOSIS — R278 Other lack of coordination: Secondary | ICD-10-CM | POA: Diagnosis present

## 2021-04-19 DIAGNOSIS — N189 Chronic kidney disease, unspecified: Secondary | ICD-10-CM | POA: Diagnosis not present

## 2021-04-19 DIAGNOSIS — I2510 Atherosclerotic heart disease of native coronary artery without angina pectoris: Secondary | ICD-10-CM | POA: Diagnosis not present

## 2021-04-19 DIAGNOSIS — I161 Hypertensive emergency: Secondary | ICD-10-CM | POA: Diagnosis present

## 2021-04-19 DIAGNOSIS — E875 Hyperkalemia: Secondary | ICD-10-CM | POA: Diagnosis not present

## 2021-04-19 DIAGNOSIS — K219 Gastro-esophageal reflux disease without esophagitis: Secondary | ICD-10-CM | POA: Diagnosis present

## 2021-04-19 DIAGNOSIS — R29709 NIHSS score 9: Secondary | ICD-10-CM | POA: Diagnosis present

## 2021-04-19 DIAGNOSIS — Z20822 Contact with and (suspected) exposure to covid-19: Secondary | ICD-10-CM | POA: Diagnosis not present

## 2021-04-19 DIAGNOSIS — F419 Anxiety disorder, unspecified: Secondary | ICD-10-CM | POA: Diagnosis not present

## 2021-04-19 DIAGNOSIS — I63531 Cerebral infarction due to unspecified occlusion or stenosis of right posterior cerebral artery: Principal | ICD-10-CM | POA: Diagnosis present

## 2021-04-19 DIAGNOSIS — I6389 Other cerebral infarction: Secondary | ICD-10-CM

## 2021-04-19 DIAGNOSIS — D689 Coagulation defect, unspecified: Secondary | ICD-10-CM | POA: Diagnosis not present

## 2021-04-19 DIAGNOSIS — T45525A Adverse effect of antithrombotic drugs, initial encounter: Secondary | ICD-10-CM | POA: Diagnosis present

## 2021-04-19 DIAGNOSIS — K5901 Slow transit constipation: Secondary | ICD-10-CM | POA: Diagnosis not present

## 2021-04-19 DIAGNOSIS — E1159 Type 2 diabetes mellitus with other circulatory complications: Secondary | ICD-10-CM | POA: Diagnosis not present

## 2021-04-19 DIAGNOSIS — E1122 Type 2 diabetes mellitus with diabetic chronic kidney disease: Secondary | ICD-10-CM | POA: Diagnosis present

## 2021-04-19 DIAGNOSIS — D6832 Hemorrhagic disorder due to extrinsic circulating anticoagulants: Secondary | ICD-10-CM | POA: Diagnosis not present

## 2021-04-19 DIAGNOSIS — Z820 Family history of epilepsy and other diseases of the nervous system: Secondary | ICD-10-CM

## 2021-04-19 DIAGNOSIS — G8194 Hemiplegia, unspecified affecting left nondominant side: Secondary | ICD-10-CM | POA: Diagnosis present

## 2021-04-19 DIAGNOSIS — N182 Chronic kidney disease, stage 2 (mild): Secondary | ICD-10-CM | POA: Diagnosis not present

## 2021-04-19 DIAGNOSIS — F32A Depression, unspecified: Secondary | ICD-10-CM | POA: Diagnosis not present

## 2021-04-19 DIAGNOSIS — R7309 Other abnormal glucose: Secondary | ICD-10-CM | POA: Diagnosis not present

## 2021-04-19 DIAGNOSIS — I2699 Other pulmonary embolism without acute cor pulmonale: Secondary | ICD-10-CM | POA: Diagnosis not present

## 2021-04-19 DIAGNOSIS — E785 Hyperlipidemia, unspecified: Secondary | ICD-10-CM | POA: Diagnosis present

## 2021-04-19 DIAGNOSIS — E1169 Type 2 diabetes mellitus with other specified complication: Secondary | ICD-10-CM | POA: Diagnosis not present

## 2021-04-19 DIAGNOSIS — Z833 Family history of diabetes mellitus: Secondary | ICD-10-CM | POA: Diagnosis not present

## 2021-04-19 DIAGNOSIS — J9621 Acute and chronic respiratory failure with hypoxia: Secondary | ICD-10-CM | POA: Diagnosis present

## 2021-04-19 DIAGNOSIS — K5900 Constipation, unspecified: Secondary | ICD-10-CM | POA: Diagnosis not present

## 2021-04-19 DIAGNOSIS — R931 Abnormal findings on diagnostic imaging of heart and coronary circulation: Secondary | ICD-10-CM | POA: Diagnosis not present

## 2021-04-19 DIAGNOSIS — I129 Hypertensive chronic kidney disease with stage 1 through stage 4 chronic kidney disease, or unspecified chronic kidney disease: Secondary | ICD-10-CM | POA: Diagnosis not present

## 2021-04-19 DIAGNOSIS — E871 Hypo-osmolality and hyponatremia: Secondary | ICD-10-CM | POA: Diagnosis not present

## 2021-04-19 DIAGNOSIS — N179 Acute kidney failure, unspecified: Secondary | ICD-10-CM | POA: Diagnosis present

## 2021-04-19 DIAGNOSIS — I6622 Occlusion and stenosis of left posterior cerebral artery: Secondary | ICD-10-CM | POA: Diagnosis not present

## 2021-04-19 DIAGNOSIS — Z9282 Status post administration of tPA (rtPA) in a different facility within the last 24 hours prior to admission to current facility: Secondary | ICD-10-CM

## 2021-04-19 DIAGNOSIS — E669 Obesity, unspecified: Secondary | ICD-10-CM | POA: Diagnosis present

## 2021-04-19 DIAGNOSIS — R4781 Slurred speech: Secondary | ICD-10-CM | POA: Diagnosis present

## 2021-04-19 DIAGNOSIS — Z794 Long term (current) use of insulin: Secondary | ICD-10-CM | POA: Diagnosis not present

## 2021-04-19 DIAGNOSIS — Z87891 Personal history of nicotine dependence: Secondary | ICD-10-CM

## 2021-04-19 DIAGNOSIS — Z7984 Long term (current) use of oral hypoglycemic drugs: Secondary | ICD-10-CM

## 2021-04-19 DIAGNOSIS — I69354 Hemiplegia and hemiparesis following cerebral infarction affecting left non-dominant side: Secondary | ICD-10-CM | POA: Diagnosis not present

## 2021-04-19 DIAGNOSIS — R32 Unspecified urinary incontinence: Secondary | ICD-10-CM | POA: Diagnosis present

## 2021-04-19 DIAGNOSIS — I639 Cerebral infarction, unspecified: Secondary | ICD-10-CM

## 2021-04-19 DIAGNOSIS — Z7289 Other problems related to lifestyle: Secondary | ICD-10-CM

## 2021-04-19 DIAGNOSIS — I152 Hypertension secondary to endocrine disorders: Secondary | ICD-10-CM | POA: Diagnosis not present

## 2021-04-19 DIAGNOSIS — M545 Low back pain, unspecified: Secondary | ICD-10-CM | POA: Diagnosis not present

## 2021-04-19 DIAGNOSIS — N1831 Chronic kidney disease, stage 3a: Secondary | ICD-10-CM | POA: Diagnosis present

## 2021-04-19 DIAGNOSIS — Z6833 Body mass index (BMI) 33.0-33.9, adult: Secondary | ICD-10-CM

## 2021-04-19 DIAGNOSIS — R471 Dysarthria and anarthria: Secondary | ICD-10-CM | POA: Diagnosis present

## 2021-04-19 DIAGNOSIS — D447 Neoplasm of uncertain behavior of aortic body and other paraganglia: Secondary | ICD-10-CM | POA: Diagnosis present

## 2021-04-19 DIAGNOSIS — D72829 Elevated white blood cell count, unspecified: Secondary | ICD-10-CM | POA: Diagnosis not present

## 2021-04-19 DIAGNOSIS — Z96641 Presence of right artificial hip joint: Secondary | ICD-10-CM | POA: Diagnosis present

## 2021-04-19 DIAGNOSIS — Z888 Allergy status to other drugs, medicaments and biological substances status: Secondary | ICD-10-CM

## 2021-04-19 DIAGNOSIS — Z961 Presence of intraocular lens: Secondary | ICD-10-CM | POA: Diagnosis present

## 2021-04-19 DIAGNOSIS — G8929 Other chronic pain: Secondary | ICD-10-CM | POA: Diagnosis not present

## 2021-04-19 DIAGNOSIS — M79605 Pain in left leg: Secondary | ICD-10-CM | POA: Diagnosis not present

## 2021-04-19 DIAGNOSIS — Z79899 Other long term (current) drug therapy: Secondary | ICD-10-CM | POA: Diagnosis not present

## 2021-04-19 DIAGNOSIS — R059 Cough, unspecified: Secondary | ICD-10-CM | POA: Diagnosis not present

## 2021-04-19 DIAGNOSIS — I517 Cardiomegaly: Secondary | ICD-10-CM | POA: Diagnosis not present

## 2021-04-19 DIAGNOSIS — I63419 Cerebral infarction due to embolism of unspecified middle cerebral artery: Secondary | ICD-10-CM | POA: Diagnosis not present

## 2021-04-19 DIAGNOSIS — Z800 Family history of malignant neoplasm of digestive organs: Secondary | ICD-10-CM

## 2021-04-19 DIAGNOSIS — I358 Other nonrheumatic aortic valve disorders: Secondary | ICD-10-CM | POA: Diagnosis present

## 2021-04-19 DIAGNOSIS — Z880 Allergy status to penicillin: Secondary | ICD-10-CM

## 2021-04-19 DIAGNOSIS — E1165 Type 2 diabetes mellitus with hyperglycemia: Secondary | ICD-10-CM | POA: Diagnosis present

## 2021-04-19 DIAGNOSIS — Z9842 Cataract extraction status, left eye: Secondary | ICD-10-CM

## 2021-04-19 DIAGNOSIS — E119 Type 2 diabetes mellitus without complications: Secondary | ICD-10-CM | POA: Diagnosis not present

## 2021-04-19 DIAGNOSIS — I6930 Unspecified sequelae of cerebral infarction: Secondary | ICD-10-CM | POA: Diagnosis present

## 2021-04-19 DIAGNOSIS — Z9981 Dependence on supplemental oxygen: Secondary | ICD-10-CM

## 2021-04-19 DIAGNOSIS — Z8249 Family history of ischemic heart disease and other diseases of the circulatory system: Secondary | ICD-10-CM

## 2021-04-19 DIAGNOSIS — I10 Essential (primary) hypertension: Secondary | ICD-10-CM | POA: Diagnosis not present

## 2021-04-19 DIAGNOSIS — I6521 Occlusion and stenosis of right carotid artery: Secondary | ICD-10-CM | POA: Diagnosis present

## 2021-04-19 DIAGNOSIS — R1310 Dysphagia, unspecified: Secondary | ICD-10-CM | POA: Diagnosis present

## 2021-04-19 DIAGNOSIS — G2581 Restless legs syndrome: Secondary | ICD-10-CM | POA: Diagnosis not present

## 2021-04-19 DIAGNOSIS — Z01818 Encounter for other preprocedural examination: Secondary | ICD-10-CM | POA: Diagnosis not present

## 2021-04-19 DIAGNOSIS — Z9889 Other specified postprocedural states: Secondary | ICD-10-CM | POA: Diagnosis not present

## 2021-04-19 DIAGNOSIS — I672 Cerebral atherosclerosis: Secondary | ICD-10-CM | POA: Diagnosis present

## 2021-04-19 DIAGNOSIS — Y92239 Unspecified place in hospital as the place of occurrence of the external cause: Secondary | ICD-10-CM | POA: Diagnosis present

## 2021-04-19 HISTORY — PX: IR US GUIDE VASC ACCESS LEFT: IMG2389

## 2021-04-19 HISTORY — PX: RADIOLOGY WITH ANESTHESIA: SHX6223

## 2021-04-19 HISTORY — PX: IR PERCUTANEOUS ART THROMBECTOMY/INFUSION INTRACRANIAL INC DIAG ANGIO: IMG6087

## 2021-04-19 HISTORY — PX: IR CT HEAD LTD: IMG2386

## 2021-04-19 LAB — HEMOGLOBIN A1C
Hgb A1c MFr Bld: 9.3 % — ABNORMAL HIGH (ref 4.8–5.6)
Mean Plasma Glucose: 220.21 mg/dL

## 2021-04-19 LAB — LIPID PANEL
Cholesterol: 195 mg/dL (ref 0–200)
HDL: 46 mg/dL (ref >40)
LDL Cholesterol: 105 mg/dL — ABNORMAL HIGH (ref 0–99)
Total CHOL/HDL Ratio: 4.2 RATIO
Triglycerides: 221 mg/dL — ABNORMAL HIGH (ref <150)
VLDL: 44 mg/dL — ABNORMAL HIGH (ref 0–40)

## 2021-04-19 LAB — ECHOCARDIOGRAM COMPLETE
AV Mean grad: 7 mmHg
AV Peak grad: 12.3 mmHg
Ao pk vel: 1.75 m/s
Area-P 1/2: 3.72 cm2
Calc EF: 84.4 %
S' Lateral: 3.2 cm
Single Plane A2C EF: 87.5 %
Single Plane A4C EF: 77.9 %

## 2021-04-19 LAB — MRSA PCR SCREENING
MRSA by PCR: NEGATIVE
MRSA by PCR: NEGATIVE

## 2021-04-19 LAB — GLUCOSE, CAPILLARY
Glucose-Capillary: 132 mg/dL — ABNORMAL HIGH (ref 70–99)
Glucose-Capillary: 157 mg/dL — ABNORMAL HIGH (ref 70–99)
Glucose-Capillary: 222 mg/dL — ABNORMAL HIGH (ref 70–99)
Glucose-Capillary: 255 mg/dL — ABNORMAL HIGH (ref 70–99)
Glucose-Capillary: 262 mg/dL — ABNORMAL HIGH (ref 70–99)

## 2021-04-19 SURGERY — IR WITH ANESTHESIA
Anesthesia: General

## 2021-04-19 MED ORDER — LIDOCAINE HCL (CARDIAC) PF 100 MG/5ML IV SOSY
PREFILLED_SYRINGE | INTRAVENOUS | Status: DC | PRN
  Administered 2021-04-19: 60 mg via INTRATRACHEAL

## 2021-04-19 MED ORDER — ROCURONIUM BROMIDE 100 MG/10ML IV SOLN
INTRAVENOUS | Status: DC | PRN
  Administered 2021-04-19: 80 mg via INTRAVENOUS

## 2021-04-19 MED ORDER — LIDOCAINE HCL 1 % IJ SOLN
INTRAMUSCULAR | Status: AC
  Filled 2021-04-19: qty 20

## 2021-04-19 MED ORDER — CLEVIDIPINE BUTYRATE 0.5 MG/ML IV EMUL
0.0000 mg/h | INTRAVENOUS | Status: DC
  Administered 2021-04-19: 4 mg/h via INTRAVENOUS

## 2021-04-19 MED ORDER — PHENYLEPHRINE HCL-NACL 10-0.9 MG/250ML-% IV SOLN
INTRAVENOUS | Status: DC | PRN
  Administered 2021-04-19: 40 ug/min via INTRAVENOUS

## 2021-04-19 MED ORDER — HEPARIN SODIUM (PORCINE) 1000 UNIT/ML IJ SOLN
INTRAMUSCULAR | Status: AC
  Filled 2021-04-19: qty 1

## 2021-04-19 MED ORDER — VERAPAMIL HCL 2.5 MG/ML IV SOLN
INTRAVENOUS | Status: AC
  Filled 2021-04-19: qty 2

## 2021-04-19 MED ORDER — ACETAMINOPHEN 325 MG PO TABS
650.0000 mg | ORAL_TABLET | ORAL | Status: DC | PRN
  Administered 2021-04-21 – 2021-04-29 (×17): 650 mg via ORAL
  Filled 2021-04-19 (×17): qty 2

## 2021-04-19 MED ORDER — ACETAMINOPHEN 160 MG/5ML PO SOLN: 650.0000 mg | ORAL | Status: DC | PRN

## 2021-04-19 MED ORDER — INSULIN ASPART 100 UNIT/ML IJ SOLN
0.0000 [IU] | Freq: Three times a day (TID) | INTRAMUSCULAR | Status: DC
  Administered 2021-04-19: 2 [IU] via SUBCUTANEOUS
  Administered 2021-04-19 (×2): 8 [IU] via SUBCUTANEOUS

## 2021-04-19 MED ORDER — MUPIROCIN 2 % EX OINT
1.0000 "application " | TOPICAL_OINTMENT | Freq: Two times a day (BID) | CUTANEOUS | Status: AC
  Administered 2021-04-19 – 2021-04-23 (×10): 1 via NASAL
  Filled 2021-04-19 (×2): qty 22

## 2021-04-19 MED ORDER — SENNOSIDES-DOCUSATE SODIUM 8.6-50 MG PO TABS
1.0000 | ORAL_TABLET | Freq: Every evening | ORAL | Status: DC | PRN
  Administered 2021-04-23 – 2021-04-26 (×2): 1 via ORAL
  Filled 2021-04-19 (×2): qty 1

## 2021-04-19 MED ORDER — INSULIN ASPART 100 UNIT/ML IJ SOLN
0.0000 [IU] | Freq: Every day | INTRAMUSCULAR | Status: DC
  Administered 2021-04-19: 2 [IU] via SUBCUTANEOUS
  Administered 2021-04-20: 4 [IU] via SUBCUTANEOUS

## 2021-04-19 MED ORDER — ASPIRIN 81 MG PO CHEW
81.0000 mg | CHEWABLE_TABLET | Freq: Every day | ORAL | Status: DC
  Administered 2021-04-19 – 2021-04-29 (×11): 81 mg via ORAL
  Filled 2021-04-19 (×11): qty 1

## 2021-04-19 MED ORDER — SUGAMMADEX SODIUM 200 MG/2ML IV SOLN
INTRAVENOUS | Status: DC | PRN
  Administered 2021-04-19: 400 mg via INTRAVENOUS

## 2021-04-19 MED ORDER — PHENYLEPHRINE HCL (PRESSORS) 10 MG/ML IV SOLN
INTRAVENOUS | Status: DC | PRN
  Administered 2021-04-19: 120 ug via INTRAVENOUS

## 2021-04-19 MED ORDER — ONDANSETRON HCL 4 MG/2ML IJ SOLN
INTRAMUSCULAR | Status: DC | PRN
  Administered 2021-04-19: 4 mg via INTRAVENOUS

## 2021-04-19 MED ORDER — ACETAMINOPHEN 650 MG RE SUPP: 650.0000 mg | RECTAL | Status: DC | PRN

## 2021-04-19 MED ORDER — INSULIN ASPART 100 UNIT/ML IJ SOLN
0.0000 [IU] | Freq: Three times a day (TID) | INTRAMUSCULAR | Status: DC
  Administered 2021-04-20: 7 [IU] via SUBCUTANEOUS
  Administered 2021-04-20: 11 [IU] via SUBCUTANEOUS
  Administered 2021-04-20: 4 [IU] via SUBCUTANEOUS
  Administered 2021-04-21: 11 [IU] via SUBCUTANEOUS
  Administered 2021-04-21: 7 [IU] via SUBCUTANEOUS

## 2021-04-19 MED ORDER — IOHEXOL 240 MG/ML SOLN
INTRAMUSCULAR | Status: AC
  Administered 2021-04-19: 40 mL
  Filled 2021-04-19: qty 200

## 2021-04-19 MED ORDER — EPHEDRINE SULFATE 50 MG/ML IJ SOLN
INTRAMUSCULAR | Status: DC | PRN
  Administered 2021-04-19: 5 mg via INTRAVENOUS

## 2021-04-19 MED ORDER — PROPOFOL 10 MG/ML IV BOLUS
INTRAVENOUS | Status: DC | PRN
  Administered 2021-04-19: 160 mg via INTRAVENOUS
  Administered 2021-04-19: 40 mg via INTRAVENOUS

## 2021-04-19 MED ORDER — CHLORHEXIDINE GLUCONATE CLOTH 2 % EX PADS
6.0000 | MEDICATED_PAD | Freq: Every day | CUTANEOUS | Status: DC
  Administered 2021-04-19: 6 via TOPICAL

## 2021-04-19 MED ORDER — STROKE: EARLY STAGES OF RECOVERY BOOK: Freq: Once | Status: AC

## 2021-04-19 MED ORDER — LABETALOL HCL 5 MG/ML IV SOLN
20.0000 mg | Freq: Once | INTRAVENOUS | Status: DC
  Filled 2021-04-19: qty 4

## 2021-04-19 MED ORDER — FENTANYL CITRATE (PF) 100 MCG/2ML IJ SOLN
INTRAMUSCULAR | Status: DC | PRN
  Administered 2021-04-19: 100 ug via INTRAVENOUS

## 2021-04-19 MED ORDER — NITROGLYCERIN 1 MG/10 ML FOR IR/CATH LAB
INTRA_ARTERIAL | Status: AC
  Administered 2021-04-19: 300 ug via INTRA_ARTERIAL
  Filled 2021-04-19: qty 10

## 2021-04-19 MED ORDER — SODIUM CHLORIDE 0.9 % IV SOLN: INTRAVENOUS | Status: DC

## 2021-04-19 MED ORDER — PANTOPRAZOLE SODIUM 40 MG IV SOLR
40.0000 mg | Freq: Every day | INTRAVENOUS | Status: DC
  Administered 2021-04-19: 40 mg via INTRAVENOUS
  Filled 2021-04-19: qty 40

## 2021-04-19 MED ORDER — CLEVIDIPINE BUTYRATE 0.5 MG/ML IV EMUL
0.0000 mg/h | INTRAVENOUS | Status: DC
  Administered 2021-04-19 (×2): 14 mg/h via INTRAVENOUS
  Administered 2021-04-19: 20 mg/h via INTRAVENOUS
  Administered 2021-04-19: 12 mg/h via INTRAVENOUS
  Administered 2021-04-19: 14 mg/h via INTRAVENOUS
  Administered 2021-04-19: 11 mg/h via INTRAVENOUS
  Administered 2021-04-20: 15 mg/h via INTRAVENOUS
  Administered 2021-04-20: 18 mg/h via INTRAVENOUS
  Administered 2021-04-20: 20 mg/h via INTRAVENOUS
  Filled 2021-04-19 (×9): qty 100

## 2021-04-19 MED ORDER — CHLORHEXIDINE GLUCONATE CLOTH 2 % EX PADS
6.0000 | MEDICATED_PAD | Freq: Every day | CUTANEOUS | Status: DC
  Administered 2021-04-19 – 2021-04-25 (×7): 6 via TOPICAL

## 2021-04-19 NOTE — Procedures (Signed)
INTERVENTIONAL NEURORADIOLOGY BRIEF POSTPROCEDURE NOTE  DIAGNOSTIC CEREBRAL ANGIOGRAM AND MECHANICAL THROMBECTOMY  Attending: [name]  Assistant: None.  Diagnosis: Right PCA occlusion.  Access site: Distal left radial artery  Access closure: Inflatable band  Anesthesia: General  Medication used: refer to anesthesia documentation.  Complications: None.  Estimated blood loss: 30 cc  Specimen: None  Findings: Occlusion of the right P2/PCA. MT performed with combined stentriever and aspiration. Total of 1 pass with complete recanalization (TICI3). No hemorrhagic complication on flat panel head CT.  The patient tolerated the procedure well without incident or complication and is in stable condition.

## 2021-04-19 NOTE — Progress Notes (Signed)
CT angio head and neck with perfusion results confirmed right posterior cerebral artery P 1 segment with 13 mL area of ischemic penumbra in the right occipital lobe. Dr. Rory Percy called and spoke in conference with the patient & son [name] with myself and care RN Romilda Joy present for verbal consent to initiate transfer for LVO thrombectomy.  Care link activated by Dr. Rory Percy, initiating transfer.   [name], AGACNP-BC Acute Care Nurse Practitioner [facility]   [phone] / [phone] Please see Amion for pager details.

## 2021-04-19 NOTE — Progress Notes (Signed)
Patient in the process of leaving facility with [facility] enroute to [facility] in [place]. Patient alert & oriented x 4 with no signs of distress noted at this time. Patient denies pain.

## 2021-04-19 NOTE — Progress Notes (Signed)
PT Cancellation Note  Patient Details Name: [name] MRN: [id number] DOB: 16-Aug-1956   Cancelled Treatment:    Reason Eval/Treat Not Completed: Active bedrest order remains this morning. PT will continue to follow and evaluate as appropriate.   Karma Ganja, PT, DPT   Acute Rehabilitation Department Pager #: [phone]   Otho Bellows 04/19/2021, 7:42 AM

## 2021-04-19 NOTE — Plan of Care (Signed)
  Problem: Education: Goal: Knowledge of disease or condition will improve Outcome: Progressing Goal: Knowledge of patient specific risk factors addressed and post discharge goals established will improve Outcome: Progressing   Problem: Education: Goal: Knowledge of patient specific risk factors addressed and post discharge goals established will improve Outcome: Progressing

## 2021-04-19 NOTE — Anesthesia Procedure Notes (Signed)
Procedure Name: Intubation Date/Time: 04/19/2021 1:53 AM Performed by: Yuriko Portales T, CRNA Pre-anesthesia Checklist: Patient identified, Emergency Drugs available, Suction available and Patient being monitored Patient Re-evaluated:Patient Re-evaluated prior to induction Oxygen Delivery Method: Circle system utilized Preoxygenation: Pre-oxygenation with 100% oxygen Induction Type: IV induction Ventilation: Mask ventilation without difficulty Laryngoscope Size: Miller and 2 Grade View: Grade II Tube type: Oral Tube size: 7.0 mm Number of attempts: 1 Airway Equipment and Method: Stylet and Oral airway Placement Confirmation: ETT inserted through vocal cords under direct vision,  positive ETCO2 and breath sounds checked- equal and bilateral Secured at: 21 cm Tube secured with: Tape Dental Injury: Teeth and Oropharynx as per pre-operative assessment

## 2021-04-19 NOTE — Evaluation (Signed)
Occupational Therapy Evaluation Patient Details Name: [name] MRN: [id number] DOB: 1956/10/29 Today's Date: 04/19/2021    History of Present Illness [name] is a 65 y.o. female presenting 5/6 due to L-sided weakness and numbness, who has a thalamic stroke on the right along with the MRA showing a possible right P1 occlusion. Pt now s/p thrombectomy on 5/7 with complete recanalization. PMHx; of poorly controlled diabetes and hypertension, hyperlipidemia, obesity, anxiety, depression.   Clinical Impression   Pt PTA: Pt independent, working. Pt currently limited by decreased strength, decreased ability to care for self and decreased coordination on L side. Pt set-upA to maxA for ADL and modA to maxA +2 for mobility and transfers. L inattention noted in LUE along with dysmetria and further visual testing required. Pt very unsafe at this time to go home and very motivated. Pt would greatly benefit from continued OT skilled services for ADL, mobility and coordination of L side. OT following acutely. Pt very motivated to return to PLOF and would be an excellent candidate for CIR.      Follow Up Recommendations  CIR    Equipment Recommendations  3 in 1 bedside commode;Wheelchair (measurements OT);Wheelchair cushion (measurements OT)    Recommendations for Other Services Rehab consult     Precautions / Restrictions Precautions Precautions: Fall Restrictions Weight Bearing Restrictions: No      Mobility Bed Mobility Overal bed mobility: Needs Assistance Bed Mobility: Supine to Sit     Supine to sit: Min assist;HOB elevated     General bed mobility comments: requires cues to come up on L side and use of RUE for pulling self to EOB    Transfers Overall transfer level: Needs assistance Equipment used: 2 person hand held assist Transfers: Sit to/from [facility] Sit to Stand: Mod assist;+2 physical assistance Stand pivot transfers: Max assist;+2  physical assistance       General transfer comment: modA to power up, pt with good power through RLE, but needing significant assist on L due to leaning. pt incontinent of urine upon standing, significant assist to steady with stepping, decreased coordination of placement and positioning of LLE    Balance Overall balance assessment: Needs assistance Sitting-balance support: Single extremity supported Sitting balance-Leahy Scale: Poor Sitting balance - Comments: minG to maxA due to strong L lean, minimal initiation to correct without cues or assist Postural control: Left lateral lean Standing balance support: Bilateral upper extremity supported Standing balance-Leahy Scale: Zero Standing balance comment: maxA of 2 to maintain static stance; support provided for LUE                           ADL either performed or assessed with clinical judgement   ADL Overall ADL's : Needs assistance/impaired Eating/Feeding: Moderate assistance   Grooming: Maximal assistance;Sitting Grooming Details (indicate cue type and reason): with LUE Upper Body Bathing: Moderate assistance;Sitting   Lower Body Bathing: Maximal assistance;Sitting/lateral leans;Sit to/from stand;Bed level   Upper Body Dressing : Moderate assistance;Sitting   Lower Body Dressing: Maximal assistance;Sitting/lateral leans;Sit to/from stand;Cueing for safety Lower Body Dressing Details (indicate cue type and reason): assist to keep BLEs with figure 4 technique Toilet Transfer: Maximal assistance;+2 for physical assistance;+2 for safety/equipment;Stand-pivot;Cueing for safety;Cueing for sequencing Toilet Transfer Details (indicate cue type and reason): simulated to recliner Toileting- Clothing Manipulation and Hygiene: Maximal assistance;Sitting/lateral lean;Sit to/from stand;+2 for physical assistance;+2 for safety/equipment       Functional mobility during ADLs: Maximal assistance;+2  for physical assistance;+2 for  safety/equipment;Cueing for sequencing;Cueing for safety General ADL Comments: pt limited by decreased strength, decreased ability to care for self and decreased coordination on L side.     Vision Baseline Vision/History: No visual deficits Patient Visual Report: No change from baseline Vision Assessment?: Yes Eye Alignment: Within Functional Limits Ocular Range of Motion: Within Functional Limits Additional Comments: to be further assessed; L inattention noted     Perception     Praxis      Pertinent Vitals/Pain Pain Assessment: No/denies pain     Hand Dominance Right   Extremity/Trunk Assessment Upper Extremity Assessment Upper Extremity Assessment: Generalized weakness;RUE deficits/detail;LUE deficits/detail RUE Deficits / Details: 5/5 strength LUE Deficits / Details: 4/5 strength, dysmetria LUE Coordination: decreased fine motor;decreased gross motor   Lower Extremity Assessment Lower Extremity Assessment: LLE deficits/detail LLE Sensation:  (may need further assessment, pt reports intact) LLE Coordination: decreased gross motor   Cervical / Trunk Assessment Cervical / Trunk Assessment: Normal   Communication Communication Communication: No difficulties   Cognition Arousal/Alertness: Awake/alert Behavior During Therapy: WFL for tasks assessed/performed;Impulsive Overall Cognitive Status: Impaired/Different from baseline Area of Impairment: Attention;Memory;Following commands;Awareness;Safety/judgement;Problem solving                   Current Attention Level: Focused Memory: Decreased recall of precautions;Decreased short-term memory Following Commands: Follows one step commands consistently Safety/Judgement: Decreased awareness of safety;Decreased awareness of deficits Awareness: Intellectual Problem Solving: Decreased initiation;Slow processing;Difficulty sequencing;Requires verbal cues General Comments: Pt with decreased awareness of deficits on L  side and near inattention to LUE. Pt required cues for sequencing and for safety. Pt unaware of bladder incontinence x2 times when in standing.   General Comments  VSS on RA.Incontinent x2 times with initial standing    Exercises     Shoulder Instructions      Home Living Family/patient expects to be discharged to:: Private residence Living Arrangements: Alone Available Help at Discharge: Family;Available 24 hours/day Type of Home: House Home Access: Stairs to enter [employer] of Steps: 2-3 Entrance Stairs-Rails: Right Home Layout: One level     Bathroom Shower/Tub: Teacher, early years/pre: Standard     Home Equipment: None          Prior Functioning/Environment Level of Independence: Independent        Comments: still wokring, driving, wears readers        OT Problem List: Decreased strength;Decreased activity tolerance;Impaired balance (sitting and/or standing);Decreased coordination;Decreased cognition;Decreased safety awareness;Increased edema;Impaired UE functional use      OT Treatment/Interventions: Self-care/ADL training;Therapeutic exercise;Energy conservation;DME and/or AE instruction;Therapeutic activities;Cognitive remediation/compensation;Patient/family education;Balance training    OT Goals(Current goals can be found in the care plan section) Acute Rehab OT Goals Patient Stated Goal: return to independence OT Goal Formulation: With patient Time For Goal Achievement: 05/03/21 Potential to Achieve Goals: Good ADL Goals Pt Will Perform Eating: with min guard assist;with adaptive utensils;sitting Pt Will Perform Grooming: with min assist;sitting Pt Will Perform Lower Body Dressing: with mod assist;sitting/lateral leans;sit to/from stand Pt/caregiver will Perform Home Exercise Program: Increased strength;Increased ROM;Left upper extremity;With minimal assist;With written HEP provided Additional ADL Goal #1: Pt will increase to MinA  for functional transfers with minimal cues to keep attention to LUE. Additional ADL Goal #2: Pt will attend to L UE with minimal cues x5 mins during functional task.  OT Frequency: Min 2X/week   Barriers to D/C:            Co-evaluation PT/OT/SLP Co-Evaluation/Treatment: Yes  Reason for Co-Treatment: Complexity of the patient's impairments (multi-system involvement);For patient/therapist safety;To address functional/ADL transfers   OT goals addressed during session: ADL's and self-care      AM-PAC OT "6 Clicks" Daily Activity     Outcome Measure Help from another person eating meals?: A Lot Help from another person taking care of personal grooming?: A Lot Help from another person toileting, which includes using toliet, bedpan, or urinal?: A Lot Help from another person bathing (including washing, rinsing, drying)?: A Lot Help from another person to put on and taking off regular upper body clothing?: A Lot Help from another person to put on and taking off regular lower body clothing?: A Lot 6 Click Score: 12   End of Session Equipment Utilized During Treatment: Gait belt Nurse Communication: Mobility status  Activity Tolerance: Patient tolerated treatment well Patient left: in chair;with call bell/phone within reach;with chair alarm set  OT Visit Diagnosis: Unsteadiness on feet (R26.81);Muscle weakness (generalized) (M62.81)                Time: 2633-3545 OT Time Calculation (min): 40 min Charges:  OT General Charges $OT Visit: 1 Visit OT Evaluation $OT Eval Moderate Complexity: 1 Mod  Jefferey Pica, OTR/L Acute Rehabilitation Services Pager: [phone] Office: [place] 04/19/2021, 5:07 PM

## 2021-04-19 NOTE — Progress Notes (Signed)
*  PRELIMINARY RESULTS* Echocardiogram 2D Echocardiogram has been performed.  Luisa Hart RDCS 04/19/2021, 2:30 PM
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
## 2021-04-19 NOTE — Progress Notes (Signed)
Report given to [facility] 

## 2021-04-19 NOTE — Transfer of Care (Signed)
Immediate Anesthesia Transfer of Care Note  Patient: [name]  Procedure(s) Performed: IR WITH ANESTHESIA (N/A )  Patient Location: PACU and ICU  Anesthesia Type:General  Level of Consciousness: awake and drowsy  Airway & Oxygen Therapy: Patient Spontanous Breathing and Patient connected to face mask oxygen  Post-op Assessment: Report given to RN and Post -op Vital signs reviewed and stable  Post vital signs: Reviewed and stable  Last Vitals:  Vitals Value Taken Time  BP 141/70 04/19/21 0325  Temp    Pulse 91 04/19/21 0329  Resp 20 04/19/21 0329  SpO2 92 % 04/19/21 0329  Vitals shown include unvalidated device data.  Last Pain: There were no vitals filed for this visit.       Complications: No complications documented.

## 2021-04-19 NOTE — Progress Notes (Signed)
MRI completed. Dr. Merlene Laughter made aware.

## 2021-04-19 NOTE — Progress Notes (Signed)
Inpatient Rehab Admissions Coordinator Note:   Per PT recommendations, pt was screened for CIR candidacy by Gayland Curry, MS, CCC-SLP.  At this time we are recommending an inpatient rehab consult.  Please place IP rehab consult order If pt would like to be considered.  Please contact me with questions.    Gayland Curry, [place], [place] Admissions Coordinator [phone] 04/19/21 4:51 PM

## 2021-04-19 NOTE — Progress Notes (Signed)
Status post tPA 7 PM last night.  She is doing well at this time.  She is awake and alert and follows commands.  Left side is 4/5.  There is mild dysarthria and flattening of the nasolabial fold on the left.  There is extinction on double simultaneous stimulation involving the left lower extremity but not the left upper extremity.  Repeat imaging 24 hours after tPA likely an MRI.

## 2021-04-19 NOTE — Anesthesia Preprocedure Evaluation (Addendum)
Anesthesia Evaluation  Patient identified by MRN, date of birth, ID band Patient awake    Reviewed: Allergy & Precautions, NPO status , Patient's Chart, lab work & pertinent test results, Unable to perform ROS - Chart review onlyPreop documentation limited or incomplete due to emergent nature of procedure.  History of Anesthesia Complications Negative for: history of anesthetic complications  Airway Mallampati: II  TM Distance: >3 FB Neck ROM: Full    Dental  (+) Partial Upper, Dental Advisory Given   Pulmonary former smoker,  Covid-19 Nucleic Acid Test Results Lab Results      Component                Value               Date                      SARSCOV2NAA              NEGATIVE            04/18/2021              breath sounds clear to auscultation       Cardiovascular hypertension, Pt. on medications  Rhythm:Regular     Neuro/Psych PSYCHIATRIC DISORDERS Anxiety Depression CVA    GI/Hepatic Neg liver ROS, GERD  ,  Endo/Other  diabetes  Renal/GU CRFRenal diseaseLab Results      Component                Value               Date                      CREATININE               1.09 (H)            04/18/2021                Musculoskeletal  (+) Arthritis ,   Abdominal   Peds  Hematology Lab Results      Component                Value               Date                      WBC                      9.0                 04/18/2021                HGB                      12.9                04/18/2021                HCT                      38.0                04/18/2021                MCV  84.1                04/18/2021                PLT                      259                 04/18/2021              Anesthesia Other Findings   Reproductive/Obstetrics                            Anesthesia Physical Anesthesia Plan  ASA: III and emergent  Anesthesia Plan: General    Post-op Pain Management:    Induction: Intravenous, Rapid sequence and Cricoid pressure planned  PONV Risk Score and Plan: 3 and Ondansetron and Dexamethasone  Airway Management Planned: Oral ETT  Additional Equipment: Arterial line  Intra-op Plan:   Post-operative Plan: Possible Post-op intubation/ventilation  Informed Consent:     History available from chart only and Only emergency history available  Plan Discussed with: CRNA and Anesthesiologist  Anesthesia Plan Comments:         Anesthesia Quick Evaluation

## 2021-04-19 NOTE — Evaluation (Signed)
Physical Therapy Evaluation Patient Details Name: [name] MRN: [id number] DOB: 1956/07/27 Today's Date: 04/19/2021   History of Present Illness  [name] is a 65 y.o. female presenting 5/6 due to L-sided weakness and numbness, who has a thalamic stroke on the right along with the MRA showing a possible right P1 occlusion. Pt now s/p thrombectomy on 5/7 with complete recanalization. PMHx; of poorly controlled diabetes and hypertension, hyperlipidemia, obesity, anxiety, depression.    Clinical Impression  Pt in bed upon arrival of PT, agreeable to evaluation at this time. Prior to admission the pt was completely independent with mobility, working as EVS at [employer]. The pt now presents with limitations in functional mobility, dynamic stability, motor planning, and coordinaiton due to above dx, and will continue to benefit from skilled PT to address these deficits. The pt is highly motivated to mobilize, and was able to demo good initiation of movement for bed mobility, but required minA to complete movements due to difficulty with control of RUE to elevate trunk from [facility]. The pt then required minA to maxA to maintain seated balance due to L lateral lean. The pt demos significant ataxia and dysmetria of LLE and LUE despite good strength and force production. The pt was able to stand and complete initial stand-pivot transfer with modA of 2 due to poor stability and increased time and effort to control placement and positioning of LLE. The pt's mobility is further limited by poor awareness of deficits and safety, need for continual cues to attend to placement and use of LUE, and to slow movements to increase safety. The pt is optimistic to return to her prior level of independence and mobility, recommend CIR level therapies at d/c.      Follow Up Recommendations CIR    Equipment Recommendations  None recommended by PT    Recommendations for Other Services Rehab consult      Precautions / Restrictions Precautions Precautions: Fall Restrictions Weight Bearing Restrictions: No      Mobility  Bed Mobility Overal bed mobility: Needs Assistance Bed Mobility: Supine to Sit     Supine to sit: Min assist;HOB elevated     General bed mobility comments: requires cues to come up on L side and use of RUE for pulling self to EOB    Transfers Overall transfer level: Needs assistance Equipment used: 2 person hand held assist Transfers: Sit to/from [facility] Sit to Stand: Mod assist;+2 physical assistance Stand pivot transfers: Max assist;+2 physical assistance       General transfer comment: modA to power up, pt with good power through RLE, but needing significant assist on L due to leaning. pt incontinent of urine upon standing, significant assist to steady with stepping, decreased coordination of placement and positioning of LLE  Ambulation/Gait Ambulation/Gait assistance: Max assist;+2 physical assistance Gait Distance (Feet): 3 Feet Assistive device: 2 person hand held assist Gait Pattern/deviations: Step-to pattern;Decreased stride length;Decreased weight shift to left;Ataxic Gait velocity: decreaseed Gait velocity interpretation: <1.31 ft/sec, indicative of household ambulator General Gait Details: pt with difficulty coordination of placement of LLE, increased assist to steady due to strong L lean. increased verbal cues for directioning and movement of LLE   Modified Rankin (Stroke Patients Only) Modified Rankin (Stroke Patients Only) Pre-Morbid Rankin Score: No symptoms Modified Rankin: Severe disability     Balance Overall balance assessment: Needs assistance Sitting-balance support: Single extremity supported Sitting balance-Leahy Scale: Poor Sitting balance - Comments: minG to maxA due to strong  L lean, minimal initiation to correct without cues or assist Postural control: Left lateral lean Standing balance support:  Bilateral upper extremity supported Standing balance-Leahy Scale: Zero Standing balance comment: maxA of 2 to maintain static stance                             Pertinent Vitals/Pain Pain Assessment: No/denies pain    Home Living Family/patient expects to be discharged to:: Private residence Living Arrangements: Alone Available Help at Discharge: Family;Available 24 hours/day Type of Home: House Home Access: Stairs to enter Entrance Stairs-Rails: Right Entrance Stairs-Number of Steps: 2-3 Home Layout: One level Home Equipment: None      Prior Function Level of Independence: Independent         Comments: still wokring, driving, wears readers     Hand Dominance   Dominant Hand: Right    Extremity/Trunk Assessment   Upper Extremity Assessment Upper Extremity Assessment: Generalized weakness;RUE deficits/detail;LUE deficits/detail RUE Deficits / Details: 5/5 strength LUE Deficits / Details: 4/5 strength, dysmetria LUE Coordination: decreased fine motor;decreased gross motor    Lower Extremity Assessment Lower Extremity Assessment: LLE deficits/detail LLE Deficits / Details: 5/5 strength, ataxic LLE Sensation:  (may need further assessment, pt reports intact) LLE Coordination: decreased gross motor    Cervical / Trunk Assessment Cervical / Trunk Assessment: Normal  Communication   Communication: No difficulties  Cognition Arousal/Alertness: Awake/alert Behavior During Therapy: WFL for tasks assessed/performed;Impulsive Overall Cognitive Status: Impaired/Different from baseline Area of Impairment: Attention;Memory;Following commands;Awareness;Safety/judgement;Problem solving                   Current Attention Level: Focused Memory: Decreased recall of precautions;Decreased short-term memory Following Commands: Follows one step commands consistently Safety/Judgement: Decreased awareness of safety;Decreased awareness of deficits Awareness:  Intellectual Problem Solving: Decreased initiation;Slow processing;Difficulty sequencing;Requires verbal cues General Comments: Pt with decreased awareness of deficits on L side and near inattention to LUE. Pt required cues for sequencing and for safety. Pt unaware of bladder incontinence x2 times when in standing.      General Comments General comments (skin integrity, edema, etc.): VSS on RA.Incontinent x2 times    Exercises     Assessment/Plan    PT Assessment Patient needs continued PT services  PT Problem List Decreased strength;Decreased range of motion;Decreased activity tolerance;Decreased balance;Decreased mobility;Decreased coordination;Decreased cognition;Decreased safety awareness       PT Treatment Interventions DME instruction;Gait training;Stair training;Functional mobility training;Therapeutic activities;Therapeutic exercise;Balance training;Neuromuscular re-education;Patient/family education    PT Goals (Current goals can be found in the Care Plan section)  Acute Rehab PT Goals Patient Stated Goal: return to independence PT Goal Formulation: With patient Time For Goal Achievement: 05/03/21 Potential to Achieve Goals: Good    Frequency Min 4X/week   Barriers to discharge        Co-evaluation PT/OT/SLP Co-Evaluation/Treatment: Yes Reason for Co-Treatment: Complexity of the patient's impairments (multi-system involvement);For patient/therapist safety;To address functional/ADL transfers PT goals addressed during session: Mobility/safety with mobility;Balance;Strengthening/ROM OT goals addressed during session: ADL's and self-care       AM-PAC PT "6 Clicks" Mobility  Outcome Measure Help needed turning from your back to your side while in a flat bed without using bedrails?: A Little Help needed moving from lying on your back to sitting on the side of a flat bed without using bedrails?: A Little Help needed moving to and from a bed to a chair (including a  wheelchair)?: Total Help needed standing up from a  chair using your arms (e.g., wheelchair or bedside chair)?: A Lot Help needed to walk in hospital room?: Total Help needed climbing 3-5 steps with a railing? : Total 6 Click Score: 11    End of Session Equipment Utilized During Treatment: Gait belt Activity Tolerance: Patient tolerated treatment well Patient left: in chair;with call bell/phone within reach;with chair alarm set Nurse Communication: Mobility status PT Visit Diagnosis: Other abnormalities of gait and mobility (R26.89);Ataxic gait (R26.0)    Time: 2595-6387 PT Time Calculation (min) (ACUTE ONLY): 40 min   Charges:   PT Evaluation $PT Eval Moderate Complexity: 1 Mod PT Treatments $Therapeutic Activity: 8-22 mins        Karma Ganja, PT, DPT   Acute Rehabilitation Department Pager #: [phone]  Otho Bellows 04/19/2021, 3:52 PM

## 2021-04-19 NOTE — Progress Notes (Signed)
Referring Physician(s): Dr Rory Percy  Supervising Physician: Pedro Earls  Patient Status:  [facility] - In-pt  Chief Complaint:  Right PCA occlusion.  Subjective:  Procedure 5/7 early am: Occlusion of the right P2/PCA. MT performed with combined stentriever and aspiration. Total of 1 pass with complete recanalization (TICI3).  Pt is up in chair Left face droop apparent Speaking clearly and in sentences Denies headache or pain Denies vision or speech changes  Dtr at bedside   Allergies: Jardiance [empagliflozin] and Penicillins  Medications: Prior to Admission medications   Not on File     Vital Signs: BP 135/61   Pulse 83   Temp 98.6 F (37 C) (Oral)   Resp 16   SpO2 95%   Alert Smile = +facial left droop Opens eyes easily communicates - speech is wnl Tongue midline  left wrist site is clean and dry No bleeding; no hematoma  Moving all 4s Right moving well and to command Left is moving well and to command-- but slower   Imaging: MR ANGIO HEAD WO CONTRAST  Result Date: 04/18/2021 CLINICAL DATA:  Possible stroke.  Left arm weakness and numbness EXAM: MRI HEAD WITHOUT CONTRAST MRA HEAD WITHOUT CONTRAST TECHNIQUE: Multiplanar, multiecho pulse sequences of the brain and surrounding structures were obtained without intravenous contrast. Angiographic images of the head were obtained using MRA technique without contrast. COMPARISON:  None. FINDINGS: MRI HEAD FINDINGS Brain: Small acute infarct of the anterolateral right thalamus adjacent to the posterior limb of the right internal capsule. No other acute ischemia. No acute or chronic hemorrhage. Normal white matter signal, parenchymal volume and CSF spaces. The midline structures are normal. Vascular: Major flow voids are preserved. Skull and upper cervical spine: Normal calvarium and skull base. Visualized upper cervical spine and soft tissues are normal. Sinuses/Orbits:No paranasal sinus fluid levels  or advanced mucosal thickening. No mastoid or middle ear effusion. Normal orbits. MRA HEAD FINDINGS POSTERIOR CIRCULATION: --Vertebral arteries: Loss of normal flow related enhancement within the V4 segment of the right vertebral artery. The area of enhancement at the most distal aspect of the V4 segment is likely due to retrograde flow across the vertebrobasilar junction. Normal left V4 segment. --Inferior cerebellar arteries: Normal. --Basilar artery: Normal. --Superior cerebellar arteries: Normal. --Posterior cerebral arteries: Right PCA mid P1 segment occlusion. Left PCA is normal. ANTERIOR CIRCULATION: --Intracranial internal carotid arteries: Normal. --Anterior cerebral arteries (ACA): Normal. --Middle cerebral arteries (MCA): Normal. ANATOMIC VARIANTS: None IMPRESSION: 1. Small acute infarct of the anterolateral right thalamus adjacent to the posterior limb of the right internal capsule. 2. Right PCA mid P1 segment occlusion. Loss of normal flow related enhancement in the right vertebral artery V4 segment. These results were called by telephone at the time of interpretation on 04/18/2021 at 10:26 pm to provider [facility] , who verbally acknowledged these results. Electronically Signed   By: Ulyses Jarred M.D.   On: 04/18/2021 22:26   MR BRAIN WO CONTRAST  Result Date: 04/18/2021 CLINICAL DATA:  Possible stroke.  Left arm weakness and numbness EXAM: MRI HEAD WITHOUT CONTRAST MRA HEAD WITHOUT CONTRAST TECHNIQUE: Multiplanar, multiecho pulse sequences of the brain and surrounding structures were obtained without intravenous contrast. Angiographic images of the head were obtained using MRA technique without contrast. COMPARISON:  None. FINDINGS: MRI HEAD FINDINGS Brain: Small acute infarct of the anterolateral right thalamus adjacent to the posterior limb of the right internal capsule. No other acute ischemia. No acute or chronic hemorrhage. Normal white matter signal, parenchymal  volume and CSF spaces. The  midline structures are normal. Vascular: Major flow voids are preserved. Skull and upper cervical spine: Normal calvarium and skull base. Visualized upper cervical spine and soft tissues are normal. Sinuses/Orbits:No paranasal sinus fluid levels or advanced mucosal thickening. No mastoid or middle ear effusion. Normal orbits. MRA HEAD FINDINGS POSTERIOR CIRCULATION: --Vertebral arteries: Loss of normal flow related enhancement within the V4 segment of the right vertebral artery. The area of enhancement at the most distal aspect of the V4 segment is likely due to retrograde flow across the vertebrobasilar junction. Normal left V4 segment. --Inferior cerebellar arteries: Normal. --Basilar artery: Normal. --Superior cerebellar arteries: Normal. --Posterior cerebral arteries: Right PCA mid P1 segment occlusion. Left PCA is normal. ANTERIOR CIRCULATION: --Intracranial internal carotid arteries: Normal. --Anterior cerebral arteries (ACA): Normal. --Middle cerebral arteries (MCA): Normal. ANATOMIC VARIANTS: None IMPRESSION: 1. Small acute infarct of the anterolateral right thalamus adjacent to the posterior limb of the right internal capsule. 2. Right PCA mid P1 segment occlusion. Loss of normal flow related enhancement in the right vertebral artery V4 segment. These results were called by telephone at the time of interpretation on 04/18/2021 at 10:26 pm to provider [facility] , who verbally acknowledged these results. Electronically Signed   By: Ulyses Jarred M.D.   On: 04/18/2021 22:26   CT HEAD CODE STROKE WO CONTRAST  Result Date: 04/18/2021 CLINICAL DATA:  Code stroke. New onset of dizziness and left-sided weakness. Last known well 45 minutes ago. EXAM: CT HEAD WITHOUT CONTRAST TECHNIQUE: Contiguous axial images were obtained from the base of the skull through the vertex without intravenous contrast. COMPARISON:  CT head without contrast 11/05/2010 FINDINGS: Brain: No acute infarct, hemorrhage, or mass lesion is  present. No significant white matter lesions are present. The ventricles are of normal size. Basal ganglia are intact. Insular ribbon is normal. No acute or focal cortical abnormality is present. No significant extraaxial fluid collection is present. Vascular: Atherosclerotic calcifications are present within the cavernous internal carotid arteries. No hyperdense vessel is present. Skull: Calvarium is intact. No focal lytic or blastic lesions are present. Sinuses/Orbits: The paranasal sinuses and mastoid air cells are clear. Bilateral lens replacements are noted. Globes and orbits are otherwise unremarkable. ASPECTS [facility] Stroke Program Early CT Score) - Ganglionic level infarction (caudate, lentiform nuclei, internal capsule, insula, M1-M3 cortex): 7/7 - Supraganglionic infarction (M4-M6 cortex): 3/3 Total score (0-10 with 10 being normal): 10/10 IMPRESSION: 1. Normal CT appearance of the brain. 2. ASPECTS is 10/10. These results were called by telephone at the time of interpretation on 04/18/2021 at 6:50 pm to provider [facility] , who verbally acknowledged these results. Electronically Signed   By: San Morelle M.D.   On: 04/18/2021 18:50   CT ANGIO HEAD NECK W WO CM W PERF (CODE STROKE)  Result Date: 04/18/2021 CLINICAL DATA:  Stroke follow-up.  Left-sided weakness EXAM: CT ANGIOGRAPHY HEAD AND NECK CT PERFUSION BRAIN TECHNIQUE: Multidetector CT imaging of the head and neck was performed using the standard protocol during bolus administration of intravenous contrast. Multiplanar CT image reconstructions and MIPs were obtained to evaluate the vascular anatomy. Carotid stenosis measurements (when applicable) are obtained utilizing NASCET criteria, using the distal internal carotid diameter as the denominator. Multiphase CT imaging of the brain was performed following IV bolus contrast injection. Subsequent parametric perfusion maps were calculated using RAPID software. CONTRAST:  176mL OMNIPAQUE  IOHEXOL 350 MG/ML SOLN COMPARISON:  MRA head 04/18/2021 FINDINGS: CTA NECK FINDINGS SKELETON: There is no bony  spinal canal stenosis. No lytic or blastic lesion. OTHER NECK: Normal pharynx, larynx and major salivary glands. No cervical lymphadenopathy. Unremarkable thyroid gland. UPPER CHEST: No pneumothorax or pleural effusion. No nodules or masses. AORTIC ARCH: There CS calcific atherosclerosis of the aortic arch. There is no aneurysm, dissection or hemodynamically significant stenosis of the visualized portion of the aorta. Conventional 3 vessel aortic branching pattern. Mild narrowing of the proximal left subclavian artery. RIGHT CAROTID SYSTEM: No dissection, occlusion or aneurysm. There is mixed density atherosclerosis extending into the proximal ICA, resulting in 50% stenosis. LEFT CAROTID SYSTEM: No dissection, occlusion or aneurysm. Mild atherosclerotic calcification at the carotid bifurcation without hemodynamically significant stenosis. VERTEBRAL ARTERIES: Left dominant configuration. Both origins are clearly patent. There is no dissection, occlusion or flow-limiting stenosis to the skull base (V1-V3 segments). CTA HEAD FINDINGS POSTERIOR CIRCULATION: --Vertebral arteries: The right vertebral artery terminates in PICA. The vessel originating from the proximal right basilar is the anterior inferior cerebellar artery. --Inferior cerebellar arteries: Normal. --Basilar artery: Normal. --Superior cerebellar arteries: Normal. --Posterior cerebral arteries (PCA): Right P1 segment is occluded. Normal left PCA. ANTERIOR CIRCULATION: --Intracranial internal carotid arteries: Normal. --Anterior cerebral arteries (ACA): Normal. Both A1 segments are present. Patent anterior communicating artery (a-comm). --Middle cerebral arteries (MCA): Normal. VENOUS SINUSES: As permitted by contrast timing, patent. ANATOMIC VARIANTS: None Review of the MIP images confirms the above findings. CT Brain Perfusion Findings: CBF (<30%)  Volume: 33mL Perfusion (Tmax>6.0s) volume: 38mL Mismatch Volume: 46mL Infarction Location:No completed infarct visible via CT perfusion. On the earlier MRI, there is a right thalamic infarct. The penumbra is located in the right occipital lobe. IMPRESSION: 1. Occlusion of the right posterior cerebral artery P1 segment with 13 mL area of ischemic penumbra in the right occipital lobe. 2. 50 % stenosis of the proximal right internal carotid artery secondary to mixed density atherosclerosis. Aortic Atherosclerosis (ICD10-I70.0). These results were communicated to Dr. Amie Portland at 11:47 pm on 04/18/2021 by text page via the [facility] messaging system. Electronically Signed   By: Ulyses Jarred M.D.   On: 04/18/2021 23:48    Labs:  CBC: Recent Labs    09/20/20 1132 04/18/21 1821  WBC 7.1 9.0  HGB 13.4 12.9  HCT 39.4 38.0  PLT 266 259    COAGS: Recent Labs    04/18/21 1821  INR 1.0  APTT 28    BMP: Recent Labs    09/20/20 1132 04/18/21 1821  NA 135 133*  K 4.0 4.6  CL 95* 96*  CO2 25 27  GLUCOSE 146* 272*  BUN 14 25*  CALCIUM 9.8 9.0  CREATININE 0.87 1.09*  GFRNONAA 70 56*  GFRAA 81  --     LIVER FUNCTION TESTS: Recent Labs    09/20/20 1132 04/18/21 1821  BILITOT 0.4 0.6  AST 18 23  ALT 15 16  ALKPHOS 55 45  PROT 7.7 8.0  ALBUMIN 4.4 4.0    Assessment and Plan:  CVA Rt PCA occlusion Thrombectomy in NIR early this am-- TICI 3 recanalization Pt doing well Will follow  Electronically Signed: Lavonia Drafts, PA-C 04/19/2021, 2:15 PM   I spent a total of 15 Minutes at the the patient's bedside AND on the patient's hospital floor or unit, greater than 50% of which was counseling/coordinating care for Rt PCA revascularization

## 2021-04-19 NOTE — Discharge Summary (Signed)
Physician Discharge Summary  Patient ID: [name] MRN: [id number] DOB/AGE: 67-Jan-1957 67 y.o.  Admit date: 04/18/2021 Discharge date: 04/19/2021  Admission Diagnoses:  Discharge Diagnoses:  Principal Problem:   Ischemic stroke [facility]) Active Problems:   Poorly controlled type 2 diabetes mellitus ([place])   Hyperlipidemia   AKI (acute kidney injury) ([place])   Hypertensive urgency   Discharged Condition: stable  Hospital Course: 65 yo F presented to [facility] ED due to sudden onset of dizziness and difficulty using her left side. She was working at 18:00 in [facility] hospital on 2 C and was urgently brought to the ED due to concerns for stroke. ED course: Per ED documentation the patient reported her left arm as weak and numb and was having difficulty controlling it. Due to the prominent left upper extremity hemiataxia as well as slurred speech initially that has since resolved, CODE STROKE was initiated. Initial head CT negative for any acute intracranial abnormality. Dr. Curly Shores with neurology evaluated the patient and the decision was made for TPA. LKW at 18:00, patient hypertensive with SBP > 200 requiring labetalol and ultimately a clevidipine drip. Initial NIHSS: 5, TPA administered at 19:09.  CTa was not done since the exam was not consistent with an LVO.  Initial vitals: afebrile at 98.1, RR 21, NSR at 74, hypertensive urgency at 215/170 & SpO2 98% on room air. Significant labs: mild AKI with BUN/Cr. 25/ 1.09, Na: 133, Chloride: 96, hyperglycemic at 272, COVID-19 & Influenza negative.  On arrival to ICU, NIHSS: 6 ( with the addition of neglect in LLE) this finding also discussed with Dr. Curly Shores. MRI/MRA completed on the way to ICU revealed small right thalamic infarct & right PCA mid P 1 segment occlusion. Discussed with Dr. Curly Shores & Dr. Malen Gauze at [facility], STAT CT angio with perfusion study ordered and depending on results may transfer to [facility].  CT angio head and neck with perfusion results confirmed  right posterior cerebral artery P 1 segment with 13 mL area of ischemic penumbra in the right occipital lobe. Dr. Rory Percy called and spoke in conference with the patient & son [name] with myself and care RN Romilda Joy present for verbal consent to initiate transfer for LVO thrombectomy. Care link activated by Dr. Rory Percy, initiating transfer.    Consults: neurology  Significant Diagnostic Studies: discussed above  Discharge Exam: Blood pressure 140/79, pulse 80, temperature 98.3 F (36.8 C), temperature source Oral, resp. rate (!) 21, height 5' 8"  (1.727 m), weight 99.8 kg, SpO2 98 %. General: Adult female, critically ill, lying in bed, alert & responsive, NAD HEENT: MM pink/moist, anicteric, atraumatic, neck supple Neuro: A&O x 4, able to follow commands, Pupils: L-oval & unresponsive, @ baseline patient has less vision in this eye (from cataracts surgery per pt) R-round & brisk, MAE: uncoordinated mvmt with LUE CV: s1s2 RRR, NSR on monitor, no r/m/g Pulm: Regular, non labored on RA , breath sounds clear-BUL & diminished-BLL GI: soft, rounded, non tender, bs x 4 Skin: no rashes/lesions noted Extremities: warm/dry, pulses + 2 R/P, no edema noted, LUE: 4/5, RUE: 5/5, LLE: 4/5, RLE: 5/5.  Disposition: Discharge disposition: 02-Transferred to [facility]        Allergies as of 04/19/2021      Reactions   Jardiance [empagliflozin] Itching   Recurrent mycotic infections   Penicillins Rash, Other (See Comments)   Has patient had a PCN reaction causing immediate rash, facial/tongue/throat swelling, SOB or lightheadedness with hypotension: No Has patient had a PCN reaction causing  severe rash involving mucus membranes or skin necrosis: No Has patient had a PCN reaction that required hospitalization No Has patient had a PCN reaction occurring within the last 10 years: No If all of the above answers are "NO", then may proceed with Cephalosporin use.      Medication List    STOP  taking these medications   ALPRAZolam 0.5 MG tablet Commonly known as: XANAX   doxycycline 100 MG tablet Commonly known as: VIBRA-TABS   FreeStyle Freedom Lite w/Device Kit   freestyle lancets   FREESTYLE LITE test strip Generic drug: glucose blood   glipiZIDE 5 MG 24 hr tablet Commonly known as: GLUCOTROL XL   hydrochlorothiazide 25 MG tablet Commonly known as: HYDRODIURIL   Januvia 100 MG tablet Generic drug: sitaGLIPtin   losartan 25 MG tablet Commonly known as: COZAAR   meclizine 25 MG tablet Commonly known as: ANTIVERT   metFORMIN 1000 MG tablet Commonly known as: GLUCOPHAGE   multivitamin tablet   pioglitazone 45 MG tablet Commonly known as: ACTOS   rOPINIRole 1 MG tablet Commonly known as: REQUIP   sertraline 100 MG tablet Commonly known as: ZOLOFT   simvastatin 20 MG tablet Commonly known as: ZOCOR        Signed:  Domingo Pulse Rust-[place], AGACNP-BC Acute Care Nurse Practitioner [facility] [place]   [phone] / [phone] Please see Amion for pager details.   Isma Tietje L Rust-Chester 04/19/2021, 12:45 AM

## 2021-04-20 LAB — BASIC METABOLIC PANEL
Anion gap: 9 (ref 5–15)
BUN: 10 mg/dL (ref 8–23)
CO2: 24 mmol/L (ref 22–32)
Calcium: 8.8 mg/dL — ABNORMAL LOW (ref 8.9–10.3)
Chloride: 99 mmol/L (ref 98–111)
Creatinine, Ser: 0.89 mg/dL (ref 0.44–1.00)
GFR, Estimated: >60 mL/min (ref >60)
Glucose, Bld: 256 mg/dL — ABNORMAL HIGH (ref 70–99)
Potassium: 3.9 mmol/L (ref 3.5–5.1)
Sodium: 132 mmol/L — ABNORMAL LOW (ref 135–145)

## 2021-04-20 MED ORDER — ATORVASTATIN CALCIUM 40 MG PO TABS
40.0000 mg | ORAL_TABLET | Freq: Every day | ORAL | Status: DC
  Administered 2021-04-20 – 2021-04-29 (×10): 40 mg via ORAL
  Filled 2021-04-20 (×10): qty 1

## 2021-04-20 MED ORDER — LABETALOL HCL 5 MG/ML IV SOLN
10.0000 mg | INTRAVENOUS | Status: DC | PRN
  Administered 2021-04-23: 10 mg via INTRAVENOUS
  Filled 2021-04-20: qty 4

## 2021-04-20 MED ORDER — CLOPIDOGREL BISULFATE 75 MG PO TABS
75.0000 mg | ORAL_TABLET | Freq: Every day | ORAL | Status: DC
  Administered 2021-04-20 – 2021-04-29 (×10): 75 mg via ORAL
  Filled 2021-04-20 (×10): qty 1

## 2021-04-20 MED ORDER — CLOPIDOGREL BISULFATE 75 MG PO TABS: 75.0000 mg | ORAL_TABLET | Freq: Every day | ORAL | Status: DC

## 2021-04-20 MED ORDER — PANTOPRAZOLE SODIUM 40 MG PO TBEC
40.0000 mg | DELAYED_RELEASE_TABLET | Freq: Every day | ORAL | Status: DC
  Administered 2021-04-20 – 2021-04-28 (×9): 40 mg via ORAL
  Filled 2021-04-20 (×9): qty 1

## 2021-04-20 NOTE — Plan of Care (Signed)
  Problem: Education: Goal: Knowledge of General Education information will improve Description: Including pain rating scale, medication(s)/side effects and non-pharmacologic comfort measures Outcome: Progressing   Problem: Health Behavior/Discharge Planning: Goal: Ability to manage health-related needs will improve Outcome: Progressing   Problem: Clinical Measurements: Goal: Ability to maintain clinical measurements within normal limits will improve Outcome: Progressing Goal: Will remain free from infection Outcome: Progressing Goal: Diagnostic test results will improve Outcome: Progressing Goal: Respiratory complications will improve Outcome: Progressing Goal: Cardiovascular complication will be avoided Outcome: Progressing   Problem: Activity: Goal: Risk for activity intolerance will decrease Outcome: Progressing   Problem: Nutrition: Goal: Adequate nutrition will be maintained Outcome: Progressing   Problem: Coping: Goal: Level of anxiety will decrease Outcome: Progressing   Problem: Elimination: Goal: Will not experience complications related to bowel motility Outcome: Progressing Goal: Will not experience complications related to urinary retention Outcome: Progressing   Problem: Pain Managment: Goal: General experience of comfort will improve Outcome: Progressing   Problem: Safety: Goal: Ability to remain free from injury will improve Outcome: Progressing   Problem: Skin Integrity: Goal: Risk for impaired skin integrity will decrease Outcome: Progressing   Problem: Education: Goal: Knowledge of disease or condition will improve Outcome: Progressing Goal: Knowledge of secondary prevention will improve Outcome: Progressing Goal: Knowledge of patient specific risk factors addressed and post discharge goals established will improve Outcome: Progressing Goal: Individualized Educational Video(s) Outcome: Progressing

## 2021-04-20 NOTE — Progress Notes (Signed)
Referring Physician(s): DR Wilford Corner  Supervising Physician: Baldemar Lenis  Patient Status:  [facility] - In-pt  Chief Complaint:  CVA Occlusion of the right P2/PCA. MT performed with combined stentriever and aspiration. Total of 1 pass with complete recanalization (TICI3).  Subjective:  Procedure in IR 5/7 Up in bed Smiling and doing well Left facial droop sl less today Using left side better  Allergies: Jardiance [empagliflozin] and Penicillins  Medications: Prior to Admission medications   Not on File     Vital Signs: BP (!) 116/59   Pulse 96   Temp 98.6 F (37 C) (Axillary)   Resp 20   SpO2 94%   Physical Exam Musculoskeletal:        General: Normal range of motion.     Comments: Moving al 4s Left slower but better today  Left wrist site clean and ry- no bleeding  Left arm and hand still slow but better grip and movement     Imaging: MR ANGIO HEAD WO CONTRAST  Result Date: 04/18/2021 CLINICAL DATA:  Possible stroke.  Left arm weakness and numbness EXAM: MRI HEAD WITHOUT CONTRAST MRA HEAD WITHOUT CONTRAST TECHNIQUE: Multiplanar, multiecho pulse sequences of the brain and surrounding structures were obtained without intravenous contrast. Angiographic images of the head were obtained using MRA technique without contrast. COMPARISON:  None. FINDINGS: MRI HEAD FINDINGS Brain: Small acute infarct of the anterolateral right thalamus adjacent to the posterior limb of the right internal capsule. No other acute ischemia. No acute or chronic hemorrhage. Normal white matter signal, parenchymal volume and CSF spaces. The midline structures are normal. Vascular: Major flow voids are preserved. Skull and upper cervical spine: Normal calvarium and skull base. Visualized upper cervical spine and soft tissues are normal. Sinuses/Orbits:No paranasal sinus fluid levels or advanced mucosal thickening. No mastoid or middle ear effusion. Normal orbits. MRA HEAD FINDINGS  POSTERIOR CIRCULATION: --Vertebral arteries: Loss of normal flow related enhancement within the V4 segment of the right vertebral artery. The area of enhancement at the most distal aspect of the V4 segment is likely due to retrograde flow across the vertebrobasilar junction. Normal left V4 segment. --Inferior cerebellar arteries: Normal. --Basilar artery: Normal. --Superior cerebellar arteries: Normal. --Posterior cerebral arteries: Right PCA mid P1 segment occlusion. Left PCA is normal. ANTERIOR CIRCULATION: --Intracranial internal carotid arteries: Normal. --Anterior cerebral arteries (ACA): Normal. --Middle cerebral arteries (MCA): Normal. ANATOMIC VARIANTS: None IMPRESSION: 1. Small acute infarct of the anterolateral right thalamus adjacent to the posterior limb of the right internal capsule. 2. Right PCA mid P1 segment occlusion. Loss of normal flow related enhancement in the right vertebral artery V4 segment. These results were called by telephone at the time of interpretation on 04/18/2021 at 10:26 pm to provider [facility] , who verbally acknowledged these results. Electronically Signed   By: Deatra Robinson M.D.   On: 04/18/2021 22:26   MR BRAIN WO CONTRAST  Result Date: 04/19/2021 CLINICAL DATA:  Stroke, follow-up. Status post recanalization of right P2 occlusion. EXAM: MRI HEAD WITHOUT CONTRAST TECHNIQUE: Multiplanar, multiecho pulse sequences of the brain and surrounding structures were obtained without intravenous contrast. COMPARISON:  MR head 04/18/2021 FINDINGS: Brain: The diffusion-weighted images again demonstrate an acute nonhemorrhagic infarct within the lateral aspect of the right thalamus. Additional acute nonhemorrhagic infarct is now present within the more superomedial right thalamus. A punctate nonhemorrhagic infarct is present in the left thalamus. Punctate infarct noted in the right cerebral peduncle. Scattered areas of restricted diffusion are present in the  medial right occipital  lobe. Acute punctate nonhemorrhagic infarct is present in the left side of the splenium of the corpus callosum. A punctate acute infarct is suspected in the right cerebellum. T2 signal changes are associated with these areas. No acute hemorrhage is present. No significant mass effect is present. Periventricular white matter changes are otherwise stable. No significant extraaxial fluid collection is present. The brainstem and cerebellum are otherwise normal. Vascular: Flow is present in the major intracranial arteries. Flow void better demonstrated in the proximal right PCA. Skull and upper cervical spine: The craniocervical junction is normal. Upper cervical spine is within normal limits. Marrow signal is unremarkable. Sinuses/Orbits: The paranasal sinuses and mastoid air cells are clear. Bilateral lens replacements are noted. Globes and orbits are otherwise unremarkable. IMPRESSION: 1. Stable acute nonhemorrhagic infarct involving the lateral aspect of the right thalamus. 2. Additional acute nonhemorrhagic infarcts involving the more superior and medial right thalamus, the right occipital lobe, the left side of the splenium of the corpus callosum, and the right cerebellum. 3. Stable white matter disease. This likely reflects the sequela of chronic microvascular ischemia. Electronically Signed   By: San Morelle M.D.   On: 04/19/2021 17:32   MR BRAIN WO CONTRAST  Result Date: 04/18/2021 CLINICAL DATA:  Possible stroke.  Left arm weakness and numbness EXAM: MRI HEAD WITHOUT CONTRAST MRA HEAD WITHOUT CONTRAST TECHNIQUE: Multiplanar, multiecho pulse sequences of the brain and surrounding structures were obtained without intravenous contrast. Angiographic images of the head were obtained using MRA technique without contrast. COMPARISON:  None. FINDINGS: MRI HEAD FINDINGS Brain: Small acute infarct of the anterolateral right thalamus adjacent to the posterior limb of the right internal capsule. No other acute  ischemia. No acute or chronic hemorrhage. Normal white matter signal, parenchymal volume and CSF spaces. The midline structures are normal. Vascular: Major flow voids are preserved. Skull and upper cervical spine: Normal calvarium and skull base. Visualized upper cervical spine and soft tissues are normal. Sinuses/Orbits:No paranasal sinus fluid levels or advanced mucosal thickening. No mastoid or middle ear effusion. Normal orbits. MRA HEAD FINDINGS POSTERIOR CIRCULATION: --Vertebral arteries: Loss of normal flow related enhancement within the V4 segment of the right vertebral artery. The area of enhancement at the most distal aspect of the V4 segment is likely due to retrograde flow across the vertebrobasilar junction. Normal left V4 segment. --Inferior cerebellar arteries: Normal. --Basilar artery: Normal. --Superior cerebellar arteries: Normal. --Posterior cerebral arteries: Right PCA mid P1 segment occlusion. Left PCA is normal. ANTERIOR CIRCULATION: --Intracranial internal carotid arteries: Normal. --Anterior cerebral arteries (ACA): Normal. --Middle cerebral arteries (MCA): Normal. ANATOMIC VARIANTS: None IMPRESSION: 1. Small acute infarct of the anterolateral right thalamus adjacent to the posterior limb of the right internal capsule. 2. Right PCA mid P1 segment occlusion. Loss of normal flow related enhancement in the right vertebral artery V4 segment. These results were called by telephone at the time of interpretation on 04/18/2021 at 10:26 pm to provider [facility] , who verbally acknowledged these results. Electronically Signed   By: Ulyses Jarred M.D.   On: 04/18/2021 22:26   ECHOCARDIOGRAM COMPLETE  Result Date: 04/19/2021    ECHOCARDIOGRAM REPORT   Patient Name:   [name] Date of Exam: 04/19/2021 Medical Rec #:  [id number]       Height:       68.0 in Accession #:    [id number]      Weight:       220.0 lb Date of Birth:  [birth year]  BSA:          2.128 m Patient Age:    89 years         BP:           132/69 mmHg Patient Gender: F               HR:           87 bpm. Exam Location:  Inpatient Procedure: 2D Echo, Cardiac Doppler and Color Doppler Indications:    CVA  History:        Patient has no prior history of Echocardiogram examinations.                 Risk Factors:Dyslipidemia, Diabetes and Hypertension.  Sonographer:    Luisa Hart RDCS Referring Phys: [id number] ASHISH ARORA IMPRESSIONS  1. Left ventricular ejection fraction, by estimation, is 70 to 75%. The left ventricle has hyperdynamic function. The left ventricle has no regional wall motion abnormalities. Left ventricular diastolic parameters are consistent with Grade I diastolic dysfunction (impaired relaxation). Elevated left atrial pressure.  2. Right ventricular systolic function is normal. The right ventricular size is normal. Tricuspid regurgitation signal is inadequate for assessing PA pressure.  3. Left atrial size was mildly dilated.  4. The mitral valve is normal in structure. Mild mitral valve regurgitation. No evidence of mitral stenosis.  5. The aortic valve is normal in structure. Aortic valve regurgitation is not visualized. No aortic stenosis is present.  6. The inferior vena cava is normal in size with greater than 50% respiratory variability, suggesting right atrial pressure of 3 mmHg. FINDINGS  Left Ventricle: Left ventricular ejection fraction, by estimation, is 70 to 75%. The left ventricle has hyperdynamic function. The left ventricle has no regional wall motion abnormalities. The left ventricular internal cavity size was normal in size. There is no left ventricular hypertrophy. Left ventricular diastolic parameters are consistent with Grade I diastolic dysfunction (impaired relaxation). Elevated left atrial pressure. Right Ventricle: The right ventricular size is normal. No increase in right ventricular wall thickness. Right ventricular systolic function is normal. Tricuspid regurgitation signal is inadequate for  assessing PA pressure. Left Atrium: Left atrial size was mildly dilated. Right Atrium: Right atrial size was normal in size. Pericardium: There is no evidence of pericardial effusion. Mitral Valve: The mitral valve is normal in structure. Mild mitral annular calcification. Mild mitral valve regurgitation. No evidence of mitral valve stenosis. Tricuspid Valve: The tricuspid valve is normal in structure. Tricuspid valve regurgitation is not demonstrated. No evidence of tricuspid stenosis. Aortic Valve: The aortic valve is normal in structure. Aortic valve regurgitation is not visualized. No aortic stenosis is present. Aortic valve mean gradient measures 7.0 mmHg. Aortic valve peak gradient measures 12.2 mmHg. Pulmonic Valve: The pulmonic valve was normal in structure. Pulmonic valve regurgitation is not visualized. No evidence of pulmonic stenosis. Aorta: The aortic root is normal in size and structure. Venous: The inferior vena cava is normal in size with greater than 50% respiratory variability, suggesting right atrial pressure of 3 mmHg. IAS/Shunts: No atrial level shunt detected by color flow Doppler.  LEFT VENTRICLE PLAX 2D LVIDd:         5.50 cm     Diastology LVIDs:         3.20 cm     LV e' medial:    5.77 cm/s LV PW:         1.00 cm     LV E/e' medial:  16.9 LV IVS:  1.00 cm     LV e' lateral:   5.33 cm/s                            LV E/e' lateral: 18.3  LV Volumes (MOD) LV vol d, MOD A2C: 57.3 ml LV vol d, MOD A4C: 83.9 ml LV vol s, MOD A2C: 7.2 ml LV vol s, MOD A4C: 18.5 ml LV SV MOD A2C:     50.1 ml LV SV MOD A4C:     83.9 ml LV SV MOD BP:      60.2 ml RIGHT VENTRICLE RV S prime:     17.10 cm/s  PULMONARY VEINS TAPSE (M-mode): 2.7 cm      A Reversal Duration: 85.00 msec                             A Reversal Velocity: 17.80 cm/s                             Diastolic Velocity:  123XX123 cm/s                             S/D Velocity:        1.50                             Systolic Velocity:   0000000 cm/s  LEFT ATRIUM           Index       RIGHT ATRIUM           Index LA diam:      4.10 cm 1.93 cm/m  RA Area:     11.90 cm LA Vol (A2C): 59.4 ml 27.91 ml/m RA Volume:   25.00 ml  11.75 ml/m  AORTIC VALVE                    PULMONIC VALVE AV Vmax:           175.00 cm/s  PV Vmax:       1.27 m/s AV Vmean:          124.000 cm/s PV Vmean:      98.400 cm/s AV VTI:            0.360 m      PV VTI:        0.249 m AV Peak Grad:      12.2 mmHg    PV Peak grad:  6.5 mmHg AV Mean Grad:      7.0 mmHg     PV Mean grad:  4.0 mmHg LVOT Vmax:         98.40 cm/s LVOT Vmean:        73.100 cm/s LVOT VTI:          0.225 m LVOT/AV VTI ratio: 0.62  AORTA Ao Root diam: 2.80 cm Ao Asc diam:  3.00 cm MITRAL VALVE MV Area (PHT): 3.72 cm     SHUNTS MV Decel Time: 204 msec     Systemic VTI: 0.22 m MV E velocity: 97.30 cm/s MV A velocity: 150.00 cm/s MV E/A ratio:  0.65 Mihai Croitoru MD Electronically signed by Sanda Klein MD Signature Date/Time: 04/19/2021/2:58:20 PM    Final    CT HEAD CODE STROKE  WO CONTRAST  Result Date: 04/18/2021 CLINICAL DATA:  Code stroke. New onset of dizziness and left-sided weakness. Last known well 45 minutes ago. EXAM: CT HEAD WITHOUT CONTRAST TECHNIQUE: Contiguous axial images were obtained from the base of the skull through the vertex without intravenous contrast. COMPARISON:  CT head without contrast 11/05/2010 FINDINGS: Brain: No acute infarct, hemorrhage, or mass lesion is present. No significant white matter lesions are present. The ventricles are of normal size. Basal ganglia are intact. Insular ribbon is normal. No acute or focal cortical abnormality is present. No significant extraaxial fluid collection is present. Vascular: Atherosclerotic calcifications are present within the cavernous internal carotid arteries. No hyperdense vessel is present. Skull: Calvarium is intact. No focal lytic or blastic lesions are present. Sinuses/Orbits: The paranasal sinuses and mastoid air cells are clear. Bilateral  lens replacements are noted. Globes and orbits are otherwise unremarkable. ASPECTS [facility] Stroke Program Early CT Score) - Ganglionic level infarction (caudate, lentiform nuclei, internal capsule, insula, M1-M3 cortex): 7/7 - Supraganglionic infarction (M4-M6 cortex): 3/3 Total score (0-10 with 10 being normal): 10/10 IMPRESSION: 1. Normal CT appearance of the brain. 2. ASPECTS is 10/10. These results were called by telephone at the time of interpretation on 04/18/2021 at 6:50 pm to provider [facility] , who verbally acknowledged these results. Electronically Signed   By: San Morelle M.D.   On: 04/18/2021 18:50   CT ANGIO HEAD NECK W WO CM W PERF (CODE STROKE)  Result Date: 04/18/2021 CLINICAL DATA:  Stroke follow-up.  Left-sided weakness EXAM: CT ANGIOGRAPHY HEAD AND NECK CT PERFUSION BRAIN TECHNIQUE: Multidetector CT imaging of the head and neck was performed using the standard protocol during bolus administration of intravenous contrast. Multiplanar CT image reconstructions and MIPs were obtained to evaluate the vascular anatomy. Carotid stenosis measurements (when applicable) are obtained utilizing NASCET criteria, using the distal internal carotid diameter as the denominator. Multiphase CT imaging of the brain was performed following IV bolus contrast injection. Subsequent parametric perfusion maps were calculated using RAPID software. CONTRAST:  134mL OMNIPAQUE IOHEXOL 350 MG/ML SOLN COMPARISON:  MRA head 04/18/2021 FINDINGS: CTA NECK FINDINGS SKELETON: There is no bony spinal canal stenosis. No lytic or blastic lesion. OTHER NECK: Normal pharynx, larynx and major salivary glands. No cervical lymphadenopathy. Unremarkable thyroid gland. UPPER CHEST: No pneumothorax or pleural effusion. No nodules or masses. AORTIC ARCH: There CS calcific atherosclerosis of the aortic arch. There is no aneurysm, dissection or hemodynamically significant stenosis of the visualized portion of the aorta.  Conventional 3 vessel aortic branching pattern. Mild narrowing of the proximal left subclavian artery. RIGHT CAROTID SYSTEM: No dissection, occlusion or aneurysm. There is mixed density atherosclerosis extending into the proximal ICA, resulting in 50% stenosis. LEFT CAROTID SYSTEM: No dissection, occlusion or aneurysm. Mild atherosclerotic calcification at the carotid bifurcation without hemodynamically significant stenosis. VERTEBRAL ARTERIES: Left dominant configuration. Both origins are clearly patent. There is no dissection, occlusion or flow-limiting stenosis to the skull base (V1-V3 segments). CTA HEAD FINDINGS POSTERIOR CIRCULATION: --Vertebral arteries: The right vertebral artery terminates in PICA. The vessel originating from the proximal right basilar is the anterior inferior cerebellar artery. --Inferior cerebellar arteries: Normal. --Basilar artery: Normal. --Superior cerebellar arteries: Normal. --Posterior cerebral arteries (PCA): Right P1 segment is occluded. Normal left PCA. ANTERIOR CIRCULATION: --Intracranial internal carotid arteries: Normal. --Anterior cerebral arteries (ACA): Normal. Both A1 segments are present. Patent anterior communicating artery (a-comm). --Middle cerebral arteries (MCA): Normal. VENOUS SINUSES: As permitted by contrast timing, patent. ANATOMIC VARIANTS: None Review of the  MIP images confirms the above findings. CT Brain Perfusion Findings: CBF (<30%) Volume: 45mL Perfusion (Tmax>6.0s) volume: 54mL Mismatch Volume: 43mL Infarction Location:No completed infarct visible via CT perfusion. On the earlier MRI, there is a right thalamic infarct. The penumbra is located in the right occipital lobe. IMPRESSION: 1. Occlusion of the right posterior cerebral artery P1 segment with 13 mL area of ischemic penumbra in the right occipital lobe. 2. 50 % stenosis of the proximal right internal carotid artery secondary to mixed density atherosclerosis. Aortic Atherosclerosis (ICD10-I70.0).  These results were communicated to Dr. Amie Portland at 11:47 pm on 04/18/2021 by text page via the [facility] messaging system. Electronically Signed   By: Ulyses Jarred M.D.   On: 04/18/2021 23:48    Labs:  CBC: Recent Labs    09/20/20 1132 04/18/21 1821  WBC 7.1 9.0  HGB 13.4 12.9  HCT 39.4 38.0  PLT 266 259    COAGS: Recent Labs    04/18/21 1821  INR 1.0  APTT 28    BMP: Recent Labs    09/20/20 1132 04/18/21 1821  NA 135 133*  K 4.0 4.6  CL 95* 96*  CO2 25 27  GLUCOSE 146* 272*  BUN 14 25*  CALCIUM 9.8 9.0  CREATININE 0.87 1.09*  GFRNONAA 70 56*  GFRAA 81  --     LIVER FUNCTION TESTS: Recent Labs    09/20/20 1132 04/18/21 1821  BILITOT 0.4 0.6  AST 18 23  ALT 15 16  ALKPHOS 55 45  PROT 7.7 8.0  ALBUMIN 4.4 4.0    Assessment and Plan:  CVA Rt PCA occlusion Thrombectomy in NIR early 5/7-- TICI 3 recanalization Pt doing well Will follow  Electronically Signed: Lavonia Drafts, PA-C 04/20/2021, 9:24 AM   I spent a total of 15 Minutes at the the patient's bedside AND on the patient's hospital floor or unit, greater than 50% of which was counseling/coordinating care for Rt PCA revascularization
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
## 2021-04-20 NOTE — Consult Note (Signed)
Physical Medicine and Rehabilitation Consult Reason for Consult: Left side weakness Referring Physician: Dr.[place]   HPI: [name] is a 65 y.o. right-handed female with history of uncontrolled diabetes mellitus, hypertension, hyperlipidemia, remote tobacco abuse, obesity with BMI 33.45, anxiety/depression.  Per chart review patient lives alone.  Independent prior to admission and still working.  1 level home 3 steps to entry.  Presented 04/19/2021 with acute onset of left-sided weakness and poor balance as well as dizziness.  CT/MRI as well as MRA showed small acute infarct of the anterior lateral right thalamus adjacent to the posterior limb of the right internal capsule.  Right PCA mid P1 segment occlusion.  Patient did receive tPA.  Admission chemistries unremarkable except sodium 133 chloride 96 glucose 272 BUN 25 creatinine 1.09, hemoglobin A1c 9.3.  Patient underwent thrombectomy 04/19/2021 with complete recanalization per interventional radiology.  Echocardiogram with ejection fraction of 70 to 75% no wall motion abnormalities grade 1 diastolic dysfunction.  Currently maintained on aspirin 81 mg daily and Plavix 75 mg daily for CVA prophylaxis.  Tolerating a regular consistency diet.  Due to patient's left-sided weakness and decreased functional mobility recommendations of physical medicine rehab consult.   Review of Systems  Constitutional: Negative for chills and fever.  HENT: Negative for hearing loss.   Eyes: Negative for blurred vision and double vision.  Respiratory: Negative for cough and shortness of breath.   Cardiovascular: Negative for chest pain, palpitations and leg swelling.  Gastrointestinal: Positive for constipation. Negative for heartburn, nausea and vomiting.       GERD  Genitourinary: Negative for dysuria and hematuria.       Stress incontinence  Musculoskeletal: Positive for myalgias.  Skin: Negative for rash.  Neurological: Positive for sensory change,  speech change and weakness.  Psychiatric/Behavioral: Positive for depression.       Anxiety  All other systems reviewed and are negative.  Past Medical History:  Diagnosis Date  . Anxiety   . Arthritis   . Cataract    left  . Depression   . Diabetes mellitus without complication ([facility])   . Environmental and seasonal allergies   . GERD (gastroesophageal reflux disease)   . Hyperlipidemia   . Hypertension   . Joint pain    as reported by patient  . Urinary incontinence    as stated by patient   Past Surgical History:  Procedure Laterality Date  . BREAST EXCISIONAL BIOPSY Right 1999   neg  . BREAST LUMPECTOMY Right 2005   as reported by patient  . CATARACT EXTRACTION  January 2013  . CATARACT EXTRACTION W/PHACO Left 12/02/2016   Procedure: CATARACT EXTRACTION PHACO AND INTRAOCULAR LENS PLACEMENT (IOC);  Surgeon: Estill Cotta, MD;  Location: [facility] ORS;  Service: Ophthalmology;  Laterality: Left;  [place] 2:14 AP% 28.4 CDE 59.73 Fluid pack lot # [id number]  . DIAGNOSTIC LAPAROSCOPY    . IR PERCUTANEOUS ART THROMBECTOMY/INFUSION INTRACRANIAL INC DIAG ANGIO  04/19/2021      . TOTAL HIP ARTHROPLASTY Right 04/04/2018   Procedure: TOTAL HIP ARTHROPLASTY;  Surgeon: Dereck Leep, MD;  Location: [facility] ORS;  Service: Orthopedics;  Laterality: Right;   Family History  Problem Relation Age of Onset  . Bone cancer Brother   . Aneurysm Mother        brain  . Diabetes Father   . CAD Father   . Heart failure Father   . Colon cancer Father   . Cancer Father   . Alzheimer's disease  Paternal Grandmother   . Dementia Paternal Grandmother   . Mental illness Paternal Grandfather   . Healthy Daughter   . Healthy Son   . Breast cancer Neg Hx    Social History:  reports that she quit smoking about 14 years ago. Her smoking use included cigarettes. She has a 37.50 pack-year smoking history. She has never used smokeless tobacco. She reports current alcohol use. She reports that she does not use  drugs. Allergies:  Allergies  Allergen Reactions  . Jardiance [Empagliflozin] Itching    Recurrent mycotic infections  . Penicillins Rash and Other (See Comments)    Has patient had a PCN reaction causing immediate rash, facial/tongue/throat swelling, SOB or lightheadedness with hypotension: No Has patient had a PCN reaction causing severe rash involving mucus membranes or skin necrosis: No Has patient had a PCN reaction that required hospitalization No Has patient had a PCN reaction occurring within the last 10 years: No If all of the above answers are "NO", then may proceed with Cephalosporin use.    Medications Prior to Admission  Medication Sig Dispense Refill  . glipiZIDE (GLUCOTROL XL) 5 MG 24 hr tablet Take 5 mg by mouth daily with breakfast.    . hydrochlorothiazide (HYDRODIURIL) 25 MG tablet Take 25 mg by mouth daily.    Marland Kitchen losartan (COZAAR) 25 MG tablet Take 25 mg by mouth 2 (two) times daily.    . metFORMIN (GLUCOPHAGE) 1000 MG tablet Take 1,000 mg by mouth 2 (two) times daily with a meal.    . Multiple Vitamin (MULTIVITAMIN) tablet Take 1 tablet by mouth daily.    . pioglitazone (ACTOS) 45 MG tablet Take 45 mg by mouth daily.    Marland Kitchen rOPINIRole (REQUIP) 1 MG tablet Take 1 mg by mouth at bedtime.    . sertraline (ZOLOFT) 100 MG tablet Take 100 mg by mouth daily.    . simvastatin (ZOCOR) 20 MG tablet Take 20 mg by mouth at bedtime.    . sitaGLIPtin (JANUVIA) 100 MG tablet Take 100 mg by mouth daily.      Home: Home Living Family/patient expects to be discharged to:: Private residence Living Arrangements: Alone Available Help at Discharge: Family,Available 24 hours/day Type of Home: House Home Access: Stairs to enter [employer] of Steps: 2-3 Entrance Stairs-Rails: Right Home Layout: One level Bathroom Shower/Tub: Chiropodist: Standard Home Equipment: None  Functional History: Prior Function Level of Independence: Independent Comments:  still wokring, driving, wears readers Functional Status:  Mobility: Bed Mobility Overal bed mobility: Needs Assistance Bed Mobility: Supine to Sit Supine to sit: Min assist,HOB elevated General bed mobility comments: requires cues to come up on L side and use of RUE for pulling self to EOB Transfers Overall transfer level: Needs assistance Equipment used: 2 person hand held assist Transfers: Sit to/from [employer] Sit to Stand: Mod assist,+2 physical assistance Stand pivot transfers: Max assist,+2 physical assistance General transfer comment: modA to power up, pt with good power through RLE, but needing significant assist on L due to leaning. pt incontinent of urine upon standing, significant assist to steady with stepping, decreased coordination of placement and positioning of LLE Ambulation/Gait Ambulation/Gait assistance: Max assist,+2 physical assistance Gait Distance (Feet): 3 Feet Assistive device: 2 person hand held assist Gait Pattern/deviations: Step-to pattern,Decreased stride length,Decreased weight shift to left,Ataxic General Gait Details: pt with difficulty coordination of placement of LLE, increased assist to steady due to strong L lean. increased verbal cues for directioning and movement of  LLE Gait velocity: decreaseed Gait velocity interpretation: <1.31 ft/sec, indicative of household ambulator    ADL: ADL Overall ADL's : Needs assistance/impaired Eating/Feeding: Moderate assistance Grooming: Maximal assistance,Sitting Grooming Details (indicate cue type and reason): with LUE Upper Body Bathing: Moderate assistance,Sitting Lower Body Bathing: Maximal assistance,Sitting/lateral leans,Sit to/from stand,Bed level Upper Body Dressing : Moderate assistance,Sitting Lower Body Dressing: Maximal assistance,Sitting/lateral leans,Sit to/from stand,Cueing for safety Lower Body Dressing Details (indicate cue type and reason): assist to keep BLEs with figure 4  technique Toilet Transfer: Maximal assistance,+2 for physical assistance,+2 for safety/equipment,Stand-pivot,Cueing for safety,Cueing for sequencing Toilet Transfer Details (indicate cue type and reason): simulated to recliner Toileting- Clothing Manipulation and Hygiene: Maximal assistance,Sitting/lateral lean,Sit to/from stand,+2 for physical assistance,+2 for safety/equipment Functional mobility during ADLs: Maximal assistance,+2 for physical assistance,+2 for safety/equipment,Cueing for sequencing,Cueing for safety General ADL Comments: pt limited by decreased strength, decreased ability to care for self and decreased coordination on L side.  Cognition: Cognition Overall Cognitive Status: Impaired/Different from baseline Orientation Level: Oriented X4 Cognition Arousal/Alertness: Awake/alert Behavior During Therapy: WFL for tasks assessed/performed,Impulsive Overall Cognitive Status: Impaired/Different from baseline Area of Impairment: Attention,Memory,Following commands,Awareness,Safety/judgement,Problem solving Current Attention Level: Focused Memory: Decreased recall of precautions,Decreased short-term memory Following Commands: Follows one step commands consistently Safety/Judgement: Decreased awareness of safety,Decreased awareness of deficits Awareness: Intellectual Problem Solving: Decreased initiation,Slow processing,Difficulty sequencing,Requires verbal cues General Comments: Pt with decreased awareness of deficits on L side and near inattention to LUE. Pt required cues for sequencing and for safety. Pt unaware of bladder incontinence x2 times when in standing.  Blood pressure (!) 161/71, pulse 86, temperature 98.7 F (37.1 C), temperature source Oral, resp. rate 18, height 5\' 8"  (1.727 m), weight 99 kg, SpO2 93 %. Physical Exam Vitals and nursing note reviewed.  Constitutional:      Appearance: She is obese.  HENT:     Head: Normocephalic and atraumatic.  Eyes:      Extraocular Movements: Extraocular movements intact.     Conjunctiva/sclera: Conjunctivae normal.     Pupils: Pupils are equal, round, and reactive to light.  Cardiovascular:     Rate and Rhythm: Normal rate and regular rhythm.     Heart sounds: Normal heart sounds.  Pulmonary:     Effort: Pulmonary effort is normal. No respiratory distress.     Breath sounds: Normal breath sounds.  Abdominal:     General: Abdomen is flat. Bowel sounds are normal. There is no distension.     Palpations: Abdomen is soft.     Tenderness: There is no abdominal tenderness.  Musculoskeletal:        General: No tenderness.     Comments: No pain with upper limb or lower limb range of motion No evidence of knee effusion bilaterally  Skin:    General: Skin is warm and dry.  Neurological:     Mental Status: She is alert and oriented to person, place, and time.     Comments: Patient is alert no acute distress.  Makes eye contact with examiner.  Speech is a bit dysarthric but intelligible.  Provides name and age.  Follows simple commands.  Fair insight and awareness. Visual fields are intact confrontation testing Motor strength is 5/5 in the right deltoid, bicep, tricep, grip, hip flexor, knee extensor, ankle dorsiflexor and plantar flexor 3 - left deltoid bicep tricep 2 - finger flexors 0 finger extensors 0 wrist extensors 3 - left hip flexor 4 knee extensor and 4 - ankle dorsiflexor. Sensation is reduced to light touch as  well as deep pressure in the left upper and left lower extremity. Sensation is intact on the right side  Psychiatric:        Mood and Affect: Mood normal.        Behavior: Behavior normal.     Results for orders placed or performed during the hospital encounter of 04/19/21 (from the past 24 hour(s))  Basic metabolic panel     Status: Abnormal   Collection Time: 04/20/21  9:43 AM  Result Value Ref Range   Sodium 132 (L) 135 - 145 mmol/L   Potassium 3.9 3.5 - 5.1 mmol/L   Chloride 99 98  - 111 mmol/L   CO2 24 22 - 32 mmol/L   Glucose, Bld 256 (H) 70 - 99 mg/dL   BUN 10 8 - 23 mg/dL   Creatinine, Ser 0.89 0.44 - 1.00 mg/dL   Calcium 8.8 (L) 8.9 - 10.3 mg/dL   GFR, Estimated >60 >60 mL/min   Anion gap 9 5 - 15   MR BRAIN WO CONTRAST  Result Date: 04/19/2021 CLINICAL DATA:  Stroke, follow-up. Status post recanalization of right P2 occlusion. EXAM: MRI HEAD WITHOUT CONTRAST TECHNIQUE: Multiplanar, multiecho pulse sequences of the brain and surrounding structures were obtained without intravenous contrast. COMPARISON:  MR head 04/18/2021 FINDINGS: Brain: The diffusion-weighted images again demonstrate an acute nonhemorrhagic infarct within the lateral aspect of the right thalamus. Additional acute nonhemorrhagic infarct is now present within the more superomedial right thalamus. A punctate nonhemorrhagic infarct is present in the left thalamus. Punctate infarct noted in the right cerebral peduncle. Scattered areas of restricted diffusion are present in the medial right occipital lobe. Acute punctate nonhemorrhagic infarct is present in the left side of the splenium of the corpus callosum. A punctate acute infarct is suspected in the right cerebellum. T2 signal changes are associated with these areas. No acute hemorrhage is present. No significant mass effect is present. Periventricular white matter changes are otherwise stable. No significant extraaxial fluid collection is present. The brainstem and cerebellum are otherwise normal. Vascular: Flow is present in the major intracranial arteries. Flow void better demonstrated in the proximal right PCA. Skull and upper cervical spine: The craniocervical junction is normal. Upper cervical spine is within normal limits. Marrow signal is unremarkable. Sinuses/Orbits: The paranasal sinuses and mastoid air cells are clear. Bilateral lens replacements are noted. Globes and orbits are otherwise unremarkable. IMPRESSION: 1. Stable acute nonhemorrhagic  infarct involving the lateral aspect of the right thalamus. 2. Additional acute nonhemorrhagic infarcts involving the more superior and medial right thalamus, the right occipital lobe, the left side of the splenium of the corpus callosum, and the right cerebellum. 3. Stable white matter disease. This likely reflects the sequela of chronic microvascular ischemia. Electronically Signed   By: San Morelle M.D.   On: 04/19/2021 17:32   ECHOCARDIOGRAM COMPLETE  Result Date: 04/19/2021    ECHOCARDIOGRAM REPORT   Patient Name:   [name] Date of Exam: 04/19/2021 Medical Rec #:  [id number]       Height:       68.0 in Accession #:    [id number]      Weight:       220.0 lb Date of Birth:  01/30/1956      BSA:          2.128 m Patient Age:    38 years        BP:           132/69 mmHg Patient  Gender: F               HR:           87 bpm. Exam Location:  Inpatient Procedure: 2D Echo, Cardiac Doppler and Color Doppler Indications:    CVA  History:        Patient has no prior history of Echocardiogram examinations.                 Risk Factors:Dyslipidemia, Diabetes and Hypertension.  Sonographer:    Luisa Hart RDCS Referring Phys: [id number] ASHISH ARORA IMPRESSIONS  1. Left ventricular ejection fraction, by estimation, is 70 to 75%. The left ventricle has hyperdynamic function. The left ventricle has no regional wall motion abnormalities. Left ventricular diastolic parameters are consistent with Grade I diastolic dysfunction (impaired relaxation). Elevated left atrial pressure.  2. Right ventricular systolic function is normal. The right ventricular size is normal. Tricuspid regurgitation signal is inadequate for assessing PA pressure.  3. Left atrial size was mildly dilated.  4. The mitral valve is normal in structure. Mild mitral valve regurgitation. No evidence of mitral stenosis.  5. The aortic valve is normal in structure. Aortic valve regurgitation is not visualized. No aortic stenosis is present.  6. The  inferior vena cava is normal in size with greater than 50% respiratory variability, suggesting right atrial pressure of 3 mmHg. FINDINGS  Left Ventricle: Left ventricular ejection fraction, by estimation, is 70 to 75%. The left ventricle has hyperdynamic function. The left ventricle has no regional wall motion abnormalities. The left ventricular internal cavity size was normal in size. There is no left ventricular hypertrophy. Left ventricular diastolic parameters are consistent with Grade I diastolic dysfunction (impaired relaxation). Elevated left atrial pressure. Right Ventricle: The right ventricular size is normal. No increase in right ventricular wall thickness. Right ventricular systolic function is normal. Tricuspid regurgitation signal is inadequate for assessing PA pressure. Left Atrium: Left atrial size was mildly dilated. Right Atrium: Right atrial size was normal in size. Pericardium: There is no evidence of pericardial effusion. Mitral Valve: The mitral valve is normal in structure. Mild mitral annular calcification. Mild mitral valve regurgitation. No evidence of mitral valve stenosis. Tricuspid Valve: The tricuspid valve is normal in structure. Tricuspid valve regurgitation is not demonstrated. No evidence of tricuspid stenosis. Aortic Valve: The aortic valve is normal in structure. Aortic valve regurgitation is not visualized. No aortic stenosis is present. Aortic valve mean gradient measures 7.0 mmHg. Aortic valve peak gradient measures 12.2 mmHg. Pulmonic Valve: The pulmonic valve was normal in structure. Pulmonic valve regurgitation is not visualized. No evidence of pulmonic stenosis. Aorta: The aortic root is normal in size and structure. Venous: The inferior vena cava is normal in size with greater than 50% respiratory variability, suggesting right atrial pressure of 3 mmHg. IAS/Shunts: No atrial level shunt detected by color flow Doppler.  LEFT VENTRICLE PLAX 2D LVIDd:         5.50 cm      Diastology LVIDs:         3.20 cm     LV e' medial:    5.77 cm/s LV PW:         1.00 cm     LV E/e' medial:  16.9 LV IVS:        1.00 cm     LV e' lateral:   5.33 cm/s  LV E/e' lateral: 18.3  LV Volumes (MOD) LV vol d, MOD A2C: 57.3 ml LV vol d, MOD A4C: 83.9 ml LV vol s, MOD A2C: 7.2 ml LV vol s, MOD A4C: 18.5 ml LV SV MOD A2C:     50.1 ml LV SV MOD A4C:     83.9 ml LV SV MOD BP:      60.2 ml RIGHT VENTRICLE RV S prime:     17.10 cm/s  PULMONARY VEINS TAPSE (M-mode): 2.7 cm      A Reversal Duration: 85.00 msec                             A Reversal Velocity: 17.80 cm/s                             Diastolic Velocity:  88.41 cm/s                             S/D Velocity:        1.50                             Systolic Velocity:   66.06 cm/s LEFT ATRIUM           Index       RIGHT ATRIUM           Index LA diam:      4.10 cm 1.93 cm/m  RA Area:     11.90 cm LA Vol (A2C): 59.4 ml 27.91 ml/m RA Volume:   25.00 ml  11.75 ml/m  AORTIC VALVE                    PULMONIC VALVE AV Vmax:           175.00 cm/s  PV Vmax:       1.27 m/s AV Vmean:          124.000 cm/s PV Vmean:      98.400 cm/s AV VTI:            0.360 m      PV VTI:        0.249 m AV Peak Grad:      12.2 mmHg    PV Peak grad:  6.5 mmHg AV Mean Grad:      7.0 mmHg     PV Mean grad:  4.0 mmHg LVOT Vmax:         98.40 cm/s LVOT Vmean:        73.100 cm/s LVOT VTI:          0.225 m LVOT/AV VTI ratio: 0.62  AORTA Ao Root diam: 2.80 cm Ao Asc diam:  3.00 cm MITRAL VALVE MV Area (PHT): 3.72 cm     SHUNTS MV Decel Time: 204 msec     Systemic VTI: 0.22 m MV E velocity: 97.30 cm/s MV A velocity: 150.00 cm/s MV E/A ratio:  0.65 Mihai Croitoru MD Electronically signed by Sanda Klein MD Signature Date/Time: 04/19/2021/2:58:20 PM    Final      Assessment/Plan: Diagnosis: Right thalamic stroke 1. Does the need for close, 24 hr/day medical supervision in concert with the patient's rehab needs make it unreasonable for this patient to be  served in a less intensive setting? Yes 2. Co-Morbidities requiring supervision/potential complications:  Diabetes mellitus, hypertension, hyperlipidemia, left hemiparesis, left hemisensory deficits 3. Due to bladder management, bowel management, safety, skin/wound care, disease management, medication administration, pain management and patient education, does the patient require 24 hr/day rehab nursing? Yes 4. Does the patient require coordinated care of a physician, rehab nurse, therapy disciplines of PT, OT, speech to address physical and functional deficits in the context of the above medical diagnosis(es)? Yes Addressing deficits in the following areas: balance, endurance, locomotion, strength, transferring, bowel/bladder control, bathing, dressing, feeding, grooming, toileting, cognition and psychosocial support 5. Can the patient actively participate in an intensive therapy program of at least 3 hrs of therapy per day at least 5 days per week? Yes 6. The potential for patient to make measurable gains while on inpatient rehab is excellent 7. Anticipated functional outcomes upon discharge from inpatient rehab are supervision  with PT, supervision with OT, modified independent and supervision with SLP. 8. Estimated rehab length of stay to reach the above functional goals is: 18 to 21 days 9. Anticipated discharge destination: Home 10. Overall Rehab/Functional Prognosis: good  RECOMMENDATIONS: This patient's condition is appropriate for continued rehabilitative care in the following setting: CIR Patient has agreed to participate in recommended program. Yes Note that insurance prior authorization may be required for reimbursement for recommended care.  Comment:  We will need to see which family members would be able to assist the patient postdischarge  Cathlyn Parsons, PA-C 04/21/2021

## 2021-04-20 NOTE — Anesthesia Postprocedure Evaluation (Signed)
Anesthesia Post Note  Patient: [name]  Procedure(s) Performed: IR WITH ANESTHESIA (N/A )     Patient location during evaluation: SICU Anesthesia Type: General Level of consciousness: awake and patient cooperative Pain management: pain level controlled Vital Signs Assessment: post-procedure vital signs reviewed and stable Respiratory status: spontaneous breathing, respiratory function stable and patient connected to face mask oxygen Cardiovascular status: stable Postop Assessment: no apparent nausea or vomiting Anesthetic complications: no   No complications documented.  Last Vitals:  Vitals:   04/20/21 1500 04/20/21 1600  BP: (!) 141/75 (!) 161/80  Pulse: 75 72  Resp: 17 (!) 22  Temp:    SpO2: 97% 98%    Last Pain:  Vitals:   04/20/21 1600  TempSrc:   PainSc: 0-No pain    LLE Motor Response: Purposeful movement (04/20/21 1600) LLE Sensation: Decreased (04/20/21 1600) RLE Motor Response: Purposeful movement (04/20/21 1600) RLE Sensation: Full sensation (04/20/21 1600)      Laronn Devonshire

## 2021-04-21 ENCOUNTER — Encounter (HOSPITAL_COMMUNITY): Payer: Self-pay | Admitting: Interventional Radiology

## 2021-04-21 DIAGNOSIS — I639 Cerebral infarction, unspecified: Secondary | ICD-10-CM | POA: Diagnosis not present

## 2021-04-21 LAB — BASIC METABOLIC PANEL
Anion gap: 8 (ref 5–15)
BUN: 13 mg/dL (ref 8–23)
CO2: 27 mmol/L (ref 22–32)
Calcium: 9.2 mg/dL (ref 8.9–10.3)
Chloride: 101 mmol/L (ref 98–111)
Creatinine, Ser: 0.88 mg/dL (ref 0.44–1.00)
GFR, Estimated: >60 mL/min (ref >60)
Glucose, Bld: 197 mg/dL — ABNORMAL HIGH (ref 70–99)
Potassium: 4 mmol/L (ref 3.5–5.1)
Sodium: 136 mmol/L (ref 135–145)

## 2021-04-21 LAB — GLUCOSE, CAPILLARY
Glucose-Capillary: 307 mg/dL — ABNORMAL HIGH (ref 70–99)
Glucose-Capillary: 280 mg/dL — ABNORMAL HIGH (ref 70–99)
Glucose-Capillary: 198 mg/dL — ABNORMAL HIGH (ref 70–99)
Glucose-Capillary: 206 mg/dL — ABNORMAL HIGH (ref 70–99)
Glucose-Capillary: 301 mg/dL — ABNORMAL HIGH (ref 70–99)
Glucose-Capillary: 248 mg/dL — ABNORMAL HIGH (ref 70–99)
Glucose-Capillary: 262 mg/dL — ABNORMAL HIGH (ref 70–99)
Glucose-Capillary: 240 mg/dL — ABNORMAL HIGH (ref 70–99)
Glucose-Capillary: 237 mg/dL — ABNORMAL HIGH (ref 70–99)

## 2021-04-21 MED ORDER — INSULIN ASPART 100 UNIT/ML IJ SOLN: 4.0000 [IU] | Freq: Three times a day (TID) | INTRAMUSCULAR | Status: DC

## 2021-04-21 MED ORDER — INSULIN ASPART 100 UNIT/ML IJ SOLN
3.0000 [IU] | Freq: Three times a day (TID) | INTRAMUSCULAR | Status: DC
  Administered 2021-04-21 – 2021-04-22 (×3): 3 [IU] via SUBCUTANEOUS

## 2021-04-21 MED ORDER — INSULIN DETEMIR 100 UNIT/ML ~~LOC~~ SOLN
10.0000 [IU] | Freq: Two times a day (BID) | SUBCUTANEOUS | Status: DC
  Administered 2021-04-21 – 2021-04-22 (×2): 10 [IU] via SUBCUTANEOUS
  Filled 2021-04-21 (×3): qty 0.1

## 2021-04-21 MED ORDER — INSULIN ASPART 100 UNIT/ML IJ SOLN
0.0000 [IU] | Freq: Three times a day (TID) | INTRAMUSCULAR | Status: DC
  Administered 2021-04-21: 5 [IU] via SUBCUTANEOUS
  Administered 2021-04-22: 8 [IU] via SUBCUTANEOUS
  Administered 2021-04-22 – 2021-04-23 (×2): 5 [IU] via SUBCUTANEOUS
  Administered 2021-04-23: 11 [IU] via SUBCUTANEOUS
  Administered 2021-04-23 – 2021-04-24 (×3): 5 [IU] via SUBCUTANEOUS
  Administered 2021-04-25: 3 [IU] via SUBCUTANEOUS
  Administered 2021-04-25: 5 [IU] via SUBCUTANEOUS
  Administered 2021-04-25 – 2021-04-26 (×2): 3 [IU] via SUBCUTANEOUS
  Administered 2021-04-26 (×2): 5 [IU] via SUBCUTANEOUS
  Administered 2021-04-27 (×3): 3 [IU] via SUBCUTANEOUS
  Administered 2021-04-28: 5 [IU] via SUBCUTANEOUS
  Administered 2021-04-28: 2 [IU] via SUBCUTANEOUS
  Administered 2021-04-29: 3 [IU] via SUBCUTANEOUS
  Administered 2021-04-29: 5 [IU] via SUBCUTANEOUS

## 2021-04-21 MED ORDER — INSULIN ASPART 100 UNIT/ML IJ SOLN
0.0000 [IU] | Freq: Every day | INTRAMUSCULAR | Status: DC
  Administered 2021-04-21 – 2021-04-22 (×2): 2 [IU] via SUBCUTANEOUS
  Administered 2021-04-23 – 2021-04-25 (×2): 3 [IU] via SUBCUTANEOUS
  Administered 2021-04-28: 2 [IU] via SUBCUTANEOUS

## 2021-04-21 NOTE — Discharge Instructions (Signed)
Radial Site Care This sheet gives you information about how to care for yourself after your procedure. Your health care provider may also give you more specific instructions. If you have problems or questions, contact your health care provider. What can I expect after the procedure? After the procedure, it is common to have:  Bruising that usually fades within 1-2 weeks.  Tenderness at the site. Follow these instructions at home: Wound care 1. Follow instructions from your health care provider about how to take care of your insertion site. Make sure you: ? Wash your hands with soap and water before you change your bandage (dressing). If soap and water are not available, use hand sanitizer. ? Change your dressing as directed- pressure dressing removed 24 hours post-procedure (and switch for bandaid), bandaid removed 72 hours post-procedure 2. Do not take baths, swim, or use a hot tub for 7 days post-procedure. 3. You may shower 48 hours after the procedure or as told by your health care provider. ? Gently wash the site with plain soap and water. ? Pat the area dry with a clean towel. ? Do not rub the site. This may cause bleeding. 4. Check your site every day for signs of infection. Check for: ? Redness, swelling, or pain. ? Fluid or blood. ? Warmth. ? Pus or a bad smell. Activity  Do not stoop, bend, or lift anything that is heavier than 10 lb (4.5 kg) for 2 weeks post-procedure.  Do not drive self for 2 weeks post-procedure. Contact a health care provider if you have:  A fever or chills.  You have redness, swelling, or pain around your insertion site. Get help right away if:  The catheter insertion area swells very fast.  You pass out.  You suddenly start to sweat or your skin gets clammy.  The catheter insertion area is bleeding, and the bleeding does not stop when you hold steady pressure on the area.  The area near or just beyond the catheter insertion site becomes  pale, cool, tingly, or numb. These symptoms may represent a serious problem that is an emergency. Do not wait to see if the symptoms will go away. Get medical help right away. Call your local emergency services (911 in the [place] Do not drive yourself to the hospital.  This information is not intended to replace advice given to you by your health care provider. Make sure you discuss any questions you have with your health care provider. Document Revised: 12/13/2017 Document Reviewed: 12/13/2017 Elsevier Patient Education  2020 [employer].

## 2021-04-21 NOTE — Progress Notes (Signed)
Inpatient Rehab Admissions Coordinator:   Consult received.  I spoke to pt's son over the phone, and he asked to call me back when he was able to break from working.  I await his return call.    Shann Medal, PT, DPT Admissions Coordinator [phone] 04/21/21  1:20 PM

## 2021-04-21 NOTE — Progress Notes (Signed)
Inpatient Diabetes Program Recommendations  AACE/ADA: New Consensus Statement on Inpatient Glycemic Control (2015)  Target Ranges:  Prepandial:   less than 140 mg/dL      Peak postprandial:   less than 180 mg/dL (1-2 hours)      Critically ill patients:  140 - 180 mg/dL   Lab Results  Component Value Date   GLUCAP 262 (H) 04/21/2021   HGBA1C 9.3 (H) 04/19/2021    Review of Glycemic Control Results for ATHA, MURADYAN (MRN [id number]) as of 04/21/2021 13:10  Ref. Range 04/19/2021 15:43 04/19/2021 19:27 04/20/2021 07:42 04/20/2021 12:01 04/20/2021 15:48 04/20/2021 22:05 04/21/2021 06:05 04/21/2021 11:11  Glucose-Capillary Latest Ref Range: 70 - 99 mg/dL 262 (H) 222 (H) 280 (H) 198 (H) 206 (H) 301 (H) 248 (H) 262 (H)   Diabetes history: DM2 Outpatient Diabetes medications: Actos 45 + Januvia 100 + Glucotrol 5 mg + Metformin 1 gm bid Current orders for Inpatient glycemic control: Novolog 0-20 units tid correction   Inpatient Diabetes Program Recommendations:   Consider while oral meds held: -Levermir 10 units bid (0.2 units/kg x 99 kg = 20 units) -Novolog 3 units tid meal coverage if eats 50% -Decrease Novolog correction to moderate 0-15 units tid + hs  Thank you, Bethena Roys E. Keegen Heffern, RN, MSN, CDE  Diabetes Coordinator Inpatient Glycemic Control Team Team Pager [phone] (8am-5pm) 04/21/2021 1:23 PM

## 2021-04-21 NOTE — Progress Notes (Signed)
Physical Therapy Treatment Patient Details Name: [name] MRN: [id number] DOB: 04-02-1956 Today's Date: 04/21/2021    History of Present Illness [name] is a 65 y.o. female presenting 5/6 due to L-sided weakness and numbness, who has a thalamic stroke on the right along with the MRA showing a possible right P1 occlusion. Pt now s/p thrombectomy on 5/7 with complete recanalization. PMHx; of poorly controlled diabetes and hypertension, hyperlipidemia, obesity, anxiety, depression.    PT Comments    Today's skilled session continued to focus on mobility progression. The pt needed less assistance with bed mobility and sitting balance today. The pt is making steady progress. The pt remains appropriate for CIR stay prior to discharge home with family assistance .   Follow Up Recommendations  CIR     Equipment Recommendations  None recommended by PT    Recommendations for Other Services Rehab consult     Precautions / Restrictions Precautions Precautions: Fall Restrictions Weight Bearing Restrictions: No    Mobility  Bed Mobility Overal bed mobility: Needs Assistance Bed Mobility: Supine to Sit;Sit to Supine     Supine to sit: Min assist;HOB elevated Sit to supine: Min assist   General bed mobility comments: coming into sitting on left side of bed with HOB 30 degrees/rail used- cues on technique and sequencing needed. with return to supine- HOB flat with assist to clear bed surface with left LE. while EOB at end of session worked on lateral scooting toward [facility] prior to lying down with max assist, cues on technique and pads under pt used to facilitate movements toward [place].    Transfers                 General transfer comment: deferred due to lack of 2cd person assist for safety   Modified Rankin (Stroke Patients Only) Modified Rankin (Stroke Patients Only) Pre-Morbid Rankin Score: No symptoms Modified Rankin: Severe disability     Balance Overall  balance assessment: Needs assistance Sitting-balance support: No upper extremity supported;Single extremity supported;Feet supported Sitting balance-Leahy Scale: Fair Sitting balance - Comments: min guard to supervision at EOB- had pt workon reaching out of her base of support with right UE random heights on both right and left side of body. cues needed to find PTA hand on left, then reach. pt with occasional lean posterior and/or left lateral, able to self correct with cues. Postural control: Posterior lean;Left lateral lean               Cognition Arousal/Alertness: Awake/alert Behavior During Therapy: WFL for tasks assessed/performed;Impulsive Overall Cognitive Status: Within Functional Limits for tasks assessed Area of Impairment: Attention          Following Commands: Follows one step commands consistently     Problem Solving: Difficulty sequencing;Requires verbal cues;Requires tactile cues General Comments: continues with decreased attention to left side with cues needed to attend to it during session. Followed all commands and carried on appropriate conversation with CIR Md, including asking pertinent questions about FMLA and functional progress expectations.       Pertinent Vitals/Pain Pain Assessment: No/denies pain     PT Goals (current goals can now be found in the care plan section) Acute Rehab PT Goals Patient Stated Goal: return to independence PT Goal Formulation: With patient Time For Goal Achievement: 05/03/21 Potential to Achieve Goals: Good Progress towards PT goals: Progressing toward goals    Frequency    Min 4X/week      PT Plan Current plan remains  appropriate    Co-evaluation              AM-PAC PT "6 Clicks" Mobility   Outcome Measure  Help needed turning from your back to your side while in a flat bed without using bedrails?: A Little Help needed moving from lying on your back to sitting on the side of a flat bed without using  bedrails?: A Little Help needed moving to and from a bed to a chair (including a wheelchair)?: Total Help needed standing up from a chair using your arms (e.g., wheelchair or bedside chair)?: A Lot Help needed to walk in hospital room?: Total Help needed climbing 3-5 steps with a railing? : Total 6 Click Score: 11    End of Session Equipment Utilized During Treatment: Gait belt Activity Tolerance: Patient tolerated treatment well Patient left: in bed;with call bell/phone within reach;with bed alarm set;with family/visitor present Nurse Communication: Mobility status PT Visit Diagnosis: Other abnormalities of gait and mobility (R26.89);Ataxic gait (R26.0)     Time: 0109-3235 PT Time Calculation (min) (ACUTE ONLY): 26 min  Charges:  $Therapeutic Activity: 8-22 mins $Neuromuscular Re-education: 8-22 mins                    Willow Ora, PTA, [facility] Acute Rehab Services Office- [phone] 04/21/21, 12:46 PM   Willow Ora 04/21/2021, 12:45 PM

## 2021-04-21 NOTE — Progress Notes (Signed)
STROKE TEAM PROGRESS NOTE    INTERVAL HISTORY Patient neurological exam at appears unchanged.  Continues to have significant left arm paralysis but is able to move the left leg off the bed.  Vital signs are stable. Hyperglycemic off home regimen.   OBJECTIVE Vitals:   04/20/21 2200 04/20/21 2339 04/21/21 0329 04/21/21 0729  BP: (!) 168/74 (!) 151/70 (!) 161/71 (!) 155/74  Pulse: 93 84 86 85  Resp: (!) 22 18 18 16   Temp:  98.2 F (36.8 C) 98.7 F (37.1 C) 98.9 F (37.2 C)  TempSrc:  Oral Oral Oral  SpO2: 93% 99% 93% 93%  Weight:  99 kg    Height:  5\' 8"  (1.727 m)      CBC:  Recent Labs  Lab 04/18/21 1821  WBC 9.0  NEUTROABS 5.7  HGB 12.9  HCT 38.0  MCV 84.1  PLT 539    Basic Metabolic Panel:  Recent Labs  Lab 04/20/21 0943 04/21/21 0834  NA 132* 136  K 3.9 4.0  CL 99 101  CO2 24 27  GLUCOSE 256* 197*  BUN 10 13  CREATININE 0.89 0.88  CALCIUM 8.8* 9.2    Lipid Panel:     Component Value Date/Time   CHOL 195 04/19/2021 0427   CHOL 233 (H) 09/20/2020 1132   CHOL 205 (H) 09/06/2013 1518   TRIG 221 (H) 04/19/2021 0427   TRIG 174 09/06/2013 1518   HDL 46 04/19/2021 0427   HDL 47 09/20/2020 1132   HDL 53 09/06/2013 1518   CHOLHDL 4.2 04/19/2021 0427   VLDL 44 (H) 04/19/2021 0427   VLDL 35 09/06/2013 1518   LDLCALC 105 (H) 04/19/2021 0427   LDLCALC 152 (H) 09/20/2020 1132   LDLCALC 117 (H) 09/06/2013 1518   HgbA1c:  Lab Results  Component Value Date   HGBA1C 9.3 (H) 04/19/2021   Urine Drug Screen: No results found for: LABOPIA, COCAINSCRNUR, LABBENZ, AMPHETMU, THCU, LABBARB  Alcohol Level No results found for: ETH  IMAGING  MR BRAIN WO CONTRAST MR ANGIO HEAD WO CONTRAST 04/18/2021 IMPRESSION:  1. Small acute infarct of the anterolateral right thalamus adjacent to the posterior limb of the right internal capsule.  2. Right PCA mid P1 segment occlusion. Loss of normal flow related enhancement in the right vertebral artery V4 segment.  MR BRAIN WO  CONTRAST 04/19/2021 IMPRESSION:  1. Stable acute nonhemorrhagic infarct involving the lateral aspect of the right thalamus.  2. Additional acute nonhemorrhagic infarcts involving the more superior and medial right thalamus, the right occipital lobe, the left side of the splenium of the corpus callosum, and the right cerebellum.  3. Stable white matter disease. This likely reflects the sequela of chronic microvascular ischemia.    ECHOCARDIOGRAM COMPLETE 04/19/2021 IMPRESSIONS   1. Left ventricular ejection fraction, by estimation, is 70 to 75%. The left ventricle has hyperdynamic function. The left ventricle has no regional wall motion abnormalities. Left ventricular diastolic parameters are consistent with Grade I diastolic dysfunction (impaired relaxation). Elevated left atrial pressure.   2. Right ventricular systolic function is normal. The right ventricular size is normal. Tricuspid regurgitation signal is inadequate for assessing PA pressure.   3. Left atrial size was mildly dilated.   4. The mitral valve is normal in structure. Mild mitral valve regurgitation. No evidence of mitral stenosis.   5. The aortic valve is normal in structure. Aortic valve regurgitation is not visualized. No aortic stenosis is present.   6. The inferior vena cava is normal in  size with greater than 50% respiratory variability, suggesting right atrial pressure of 3 mmHg.   CT HEAD CODE STROKE WO CONTRAST 04/18/2021 IMPRESSION:  1. Normal CT appearance of the brain.  2. ASPECTS is 10/10.   CT ANGIO HEAD NECK W WO CM W PERF (CODE STROKE) 04/18/2021 IMPRESSION:  1. Occlusion of the right posterior cerebral artery P1 segment with 13 mL area of ischemic penumbra in the right occipital lobe.  2. 50 % stenosis of the proximal right internal carotid artery secondary to mixed density atherosclerosis.  Aortic Atherosclerosis (ICD10-I70.0).   ECG - SR rate 82 BPM. Possible old MI. (See cardiology reading for complete  details)  PHYSICAL EXAM Blood pressure (!) 155/74, pulse 85, temperature 98.9 F (37.2 C), temperature source Oral, resp. rate 16, height 5\' 8"  (1.727 m), weight 99 kg, SpO2 93 %.  GENERAL: Pleasant middle-aged Caucasian lady she is awake and alert and responsive.  HEENT: This is normal.  ABDOMEN: soft  EXTREMITIES: No edema   BACK: Normal  SKIN: Normal by inspection.    MENTAL STATUS: Alert and oriented. Speech, language and cognition are generally intact. Judgment and insight normal.   CRANIAL NERVES: Pupils are equal, round and reactive to light and accomodation; extra ocular movements are full, there is no significant nystagmus; visual fields are full; there is flattening of the nasolabial fold on left side; tongue is midline  MOTOR: Direct strength is 5/5 but there is left hemiparesis with left upper extremity 2/5 and left lower extremity 3/5 strength with drift on both..  Right side is normal.  COORDINATION: There is significant dysmetria of the left upper extremity and left lower extremity.  SENSATION: There is reduced sensation to temperature light touch along the left upper extremity and left leg.  ASSESSMENT/PLAN [name] is a 65 y.o. female with history of poorly controlled diabetes and hypertension, hyperlipidemia, obesity, anxiety, depression, presenting to [facility] with acute dizziness left-sided weakness, left-sided numbness, and possible visual deficits. She received IV tPA  via telemedicine at [place] [facility] 04/18/2021 and was found to have occlusion of the right P2/PCA and was taken to IR for thrombectomy. MT performed with combined stentriever and aspiration. Total of 1 pass with complete recanalization (TICI3). No hemorrhagic complication on flat panel head CT. [name].  Stroke: acute nonhemorrhagic lateral aspect of the right thalamus. Acute nonhemorrhagic infarcts involving the more superior and medial right thalamus,  the right occipital lobe, the left side of the splenium of the corpus callosum, and the right cerebellum - embolic - etiology unknown.  Resultant dysmetria involving the left side and left-sided numbness.  Code Stroke CT Head - Normal CT appearance of the brain. ASPECTS is 10/10.   CT head - not ordered  MRI head - 04/18/21 - Small acute infarct of the anterolateral right thalamus adjacent to the posterior limb of the right internal capsule.   MRI head - 04/19/21 - Stable acute nonhemorrhagic infarct involving the lateral aspect of the right thalamus. Additional acute nonhemorrhagic infarcts involving the more superior and medial right thalamus, the right occipital lobe, the left side of the splenium of the corpus callosum, and the right cerebellum. Stable white matter disease. This likely reflects the sequela of chronic microvascular ischemia.    MRA head - Right PCA mid P1 segment occlusion. Loss of normal flow related enhancement in the right vertebral artery V4 segment.  CTA H&N - 50 % stenosis of the proximal right internal carotid artery  secondary to mixed density atherosclerosis.   CT Perfusion - Occlusion of the right posterior cerebral artery P1 segment with 13 mL area of ischemic penumbra in the right occipital lobe.   Carotid Doppler - CTA neck ordered - carotid dopplers not indicated.  2D Echo - EF 70 to 75%. No cardiac source of emboli identified.   TEE and LOOP requested and pending  [employer] Virus 2 - negative  LDL - 105  HgbA1c - 9.3  UDS - not ordered  VTE prophylaxis - SCDs Diet: Carb restricted  No antithrombotic or antiplatelet prior to admission, now on  aspirin 81 mg daily and Plavix 75 mg daily x 3 weeks   Patient will be counseled to be compliant with her antithrombotic medications  Ongoing aggressive stroke risk factor management  Therapy recommendations:  CIR recommended -  accepted with insurance pending  Disposition:  CIR  pending  Hypertension  Home BP meds: Cozaar and HCTZ  Current BP meds: Restart home cozaar, normodyne prn  Stable, hypertensive  . Long-term BP goal normotensive  Hyperlipidemia  Home Lipid lowering medication: none  LDL 105, goal < 70  Current lipid lowering medication: will add Lipitor 40 mg daily  Continue statin at discharge  Diabetes  Home diabetic meds: Actos 45, Januvia 100, Glucotrol 5 mg,  Metformin 1000mg   bid  Current diabetic meds: insulin  HgbA1c 9.3, goal < 7.0 Recent Labs    04/20/21 0742 04/20/21 2205 04/21/21 0605  GLUCAP 280* 301* 248*  Diabetes coordinator consult appreciated, will add Levimir 10units BID, Novolog meal time coverage of 3 units, SSI to be changed to  moderate scale  Other Stroke Risk Factors  Advanced age  Former cigarette smoker - quit  ETOH use, advised to drink no more than 1 alcoholic beverage per day.  Obesity, recommend weight loss, diet and exercise as appropriate   Other Active Problems, Findings, Recommendations and/or Plan  Code status - Full Code  Aortic Atherosclerosis (ICD10-I70.0).  CKD - stage  3a - creatinine - 1.09  Hospital day # 2  I have personally obtained history,examined this patient, reviewed notes, independently viewed imaging studies, participated in medical decision making and plan of care.ROS completed by me personally and pertinent positives fully documented  I have made any additions or clarifications directly to the above note. Agree with note above.  Patient has presented with embolic strokes likely from cryptogenic stroke source.  Recommend further evaluation by checking TEE and loop recorder.  Continue ongoing therapies.  Continue aspirin Plavix for 3 weeks followed by aspirin alone.  Transfer to rehab when bed available.  Greater than 50% time during this 35-minute visit was spent in counseling and coordination of care and discussion with care team.  Antony Contras, MD Medical Director [place] Pager: [phone] 04/21/2021 12:53 PM  To contact Stroke Continuity provider, please refer to [email or web address] After hours, contact General Neurology

## 2021-04-21 NOTE — Progress Notes (Signed)
Inpatient Rehab Admissions Coordinator:   Spoke to pt's daughter, Erasmo Downer, on the phone regarding CIR expectations and goals. We reviewed Dr. Dianna Limbo consult with expected goals of supervision level with estimated stay to be about 2.5-3 weeks.  Daughter plans on having pt come stay with her at discharge and reports that she does work, but her significant other can provide supervision for the patient while she is at work.  I will start insurance authorization process and touch base with pt's son once he is able to return my call.    Shann Medal, PT, DPT Admissions Coordinator [phone] 04/21/21  1:50 PM

## 2021-04-21 NOTE — Progress Notes (Signed)
Referring Physician(s): Code stroke- Amie Portland (neurology)  Supervising Physician: Pedro Earls  Patient Status:  [facility] - In-pt  Chief Complaint:  History of acute CVA s/p cerebral arteriogram with emergent mechanical thrombectomy of right PCA P2 occlusion achieving a TICI 3 revascularization via left radial approach 04/19/2021 by Dr. Karenann Cai.  Subjective:  Patient awake and alert sitting in bed eating breakfast with no complaints at this time. Moving all extremities with weakness of left side. Left radial puncture site c/d/i.  MR brain 04/19/2021: 1. Stable acute nonhemorrhagic infarct involving the lateral aspect of the right thalamus. 2. Additional acute nonhemorrhagic infarcts involving the more superior and medial right thalamus, the right occipital lobe, the left side of the splenium of the corpus callosum, and the right cerebellum. 3. Stable white matter disease. This likely reflects the sequela of chronic microvascular ischemia.   Allergies: Jardiance [empagliflozin] and Penicillins  Medications: Prior to Admission medications   Medication Sig Start Date End Date Taking? Authorizing Provider  glipiZIDE (GLUCOTROL XL) 5 MG 24 hr tablet Take 5 mg by mouth daily with breakfast.   Yes [provider]  hydrochlorothiazide (HYDRODIURIL) 25 MG tablet Take 25 mg by mouth daily.   Yes [provider]  losartan (COZAAR) 25 MG tablet Take 25 mg by mouth 2 (two) times daily.   Yes [provider]  metFORMIN (GLUCOPHAGE) 1000 MG tablet Take 1,000 mg by mouth 2 (two) times daily with a meal.   Yes [provider]  Multiple Vitamin (MULTIVITAMIN) tablet Take 1 tablet by mouth daily.   Yes [provider]  pioglitazone (ACTOS) 45 MG tablet Take 45 mg by mouth daily.   Yes [provider]  rOPINIRole (REQUIP) 1 MG tablet Take 1 mg by mouth at bedtime.   Yes [provider]  sertraline (ZOLOFT)  100 MG tablet Take 100 mg by mouth daily.   Yes [provider]  simvastatin (ZOCOR) 20 MG tablet Take 20 mg by mouth at bedtime.   Yes [provider]  sitaGLIPtin (JANUVIA) 100 MG tablet Take 100 mg by mouth daily.   Yes [provider]     Vital Signs: BP (!) 155/74 (BP Location: Left Arm)   Pulse 85   Temp 98.9 F (37.2 C) (Oral)   Resp 16   Ht 5\' 8"  (1.727 m)   Wt 218 lb 4.1 oz (99 kg)   SpO2 93%   BMI 33.19 kg/m   Physical Exam Vitals and nursing note reviewed.  Constitutional:      General: She is not in acute distress.    Appearance: She is obese.  Pulmonary:     Effort: Pulmonary effort is normal. No respiratory distress.  Skin:    General: Skin is warm and dry.     Comments: Left radial puncture site soft without active bleeding or hematoma.  Neurological:     Mental Status: She is alert.     Comments: Alert, awake, and oriented x3. Speech and comprehension intact. PERRL bilaterally. No facial asymmetry. Can spontaneously move all extremities with weakness of left side. Decreased sensation of LUE. Mild LUE pronator drift. Distal pulses (radial) palpable bilaterally.     Imaging: MR ANGIO HEAD WO CONTRAST  Result Date: 04/18/2021 CLINICAL DATA:  Possible stroke.  Left arm weakness and numbness EXAM: MRI HEAD WITHOUT CONTRAST MRA HEAD WITHOUT CONTRAST TECHNIQUE: Multiplanar, multiecho pulse sequences of the brain and surrounding structures were obtained without intravenous contrast. Angiographic images  of the head were obtained using MRA technique without contrast. COMPARISON:  None. FINDINGS: MRI HEAD FINDINGS Brain: Small acute infarct of the anterolateral right thalamus adjacent to the posterior limb of the right internal capsule. No other acute ischemia. No acute or chronic hemorrhage. Normal white matter signal, parenchymal volume and CSF spaces. The midline structures are normal. Vascular: Major flow voids are preserved. Skull and  upper cervical spine: Normal calvarium and skull base. Visualized upper cervical spine and soft tissues are normal. Sinuses/Orbits:No paranasal sinus fluid levels or advanced mucosal thickening. No mastoid or middle ear effusion. Normal orbits. MRA HEAD FINDINGS POSTERIOR CIRCULATION: --Vertebral arteries: Loss of normal flow related enhancement within the V4 segment of the right vertebral artery. The area of enhancement at the most distal aspect of the V4 segment is likely due to retrograde flow across the vertebrobasilar junction. Normal left V4 segment. --Inferior cerebellar arteries: Normal. --Basilar artery: Normal. --Superior cerebellar arteries: Normal. --Posterior cerebral arteries: Right PCA mid P1 segment occlusion. Left PCA is normal. ANTERIOR CIRCULATION: --Intracranial internal carotid arteries: Normal. --Anterior cerebral arteries (ACA): Normal. --Middle cerebral arteries (MCA): Normal. ANATOMIC VARIANTS: None IMPRESSION: 1. Small acute infarct of the anterolateral right thalamus adjacent to the posterior limb of the right internal capsule. 2. Right PCA mid P1 segment occlusion. Loss of normal flow related enhancement in the right vertebral artery V4 segment. These results were called by telephone at the time of interpretation on 04/18/2021 at 10:26 pm to provider [facility] , who verbally acknowledged these results. Electronically Signed   By: Ulyses Jarred M.D.   On: 04/18/2021 22:26   MR BRAIN WO CONTRAST  Result Date: 04/19/2021 CLINICAL DATA:  Stroke, follow-up. Status post recanalization of right P2 occlusion. EXAM: MRI HEAD WITHOUT CONTRAST TECHNIQUE: Multiplanar, multiecho pulse sequences of the brain and surrounding structures were obtained without intravenous contrast. COMPARISON:  MR head 04/18/2021 FINDINGS: Brain: The diffusion-weighted images again demonstrate an acute nonhemorrhagic infarct within the lateral aspect of the right thalamus. Additional acute nonhemorrhagic infarct is  now present within the more superomedial right thalamus. A punctate nonhemorrhagic infarct is present in the left thalamus. Punctate infarct noted in the right cerebral peduncle. Scattered areas of restricted diffusion are present in the medial right occipital lobe. Acute punctate nonhemorrhagic infarct is present in the left side of the splenium of the corpus callosum. A punctate acute infarct is suspected in the right cerebellum. T2 signal changes are associated with these areas. No acute hemorrhage is present. No significant mass effect is present. Periventricular white matter changes are otherwise stable. No significant extraaxial fluid collection is present. The brainstem and cerebellum are otherwise normal. Vascular: Flow is present in the major intracranial arteries. Flow void better demonstrated in the proximal right PCA. Skull and upper cervical spine: The craniocervical junction is normal. Upper cervical spine is within normal limits. Marrow signal is unremarkable. Sinuses/Orbits: The paranasal sinuses and mastoid air cells are clear. Bilateral lens replacements are noted. Globes and orbits are otherwise unremarkable. IMPRESSION: 1. Stable acute nonhemorrhagic infarct involving the lateral aspect of the right thalamus. 2. Additional acute nonhemorrhagic infarcts involving the more superior and medial right thalamus, the right occipital lobe, the left side of the splenium of the corpus callosum, and the right cerebellum. 3. Stable white matter disease. This likely reflects the sequela of chronic microvascular ischemia. Electronically Signed   By: San Morelle M.D.   On: 04/19/2021 17:32   MR BRAIN WO CONTRAST  Result Date: 04/18/2021 CLINICAL DATA:  Possible stroke.  Left arm weakness and numbness EXAM: MRI HEAD WITHOUT CONTRAST MRA HEAD WITHOUT CONTRAST TECHNIQUE: Multiplanar, multiecho pulse sequences of the brain and surrounding structures were obtained without intravenous contrast.  Angiographic images of the head were obtained using MRA technique without contrast. COMPARISON:  None. FINDINGS: MRI HEAD FINDINGS Brain: Small acute infarct of the anterolateral right thalamus adjacent to the posterior limb of the right internal capsule. No other acute ischemia. No acute or chronic hemorrhage. Normal white matter signal, parenchymal volume and CSF spaces. The midline structures are normal. Vascular: Major flow voids are preserved. Skull and upper cervical spine: Normal calvarium and skull base. Visualized upper cervical spine and soft tissues are normal. Sinuses/Orbits:No paranasal sinus fluid levels or advanced mucosal thickening. No mastoid or middle ear effusion. Normal orbits. MRA HEAD FINDINGS POSTERIOR CIRCULATION: --Vertebral arteries: Loss of normal flow related enhancement within the V4 segment of the right vertebral artery. The area of enhancement at the most distal aspect of the V4 segment is likely due to retrograde flow across the vertebrobasilar junction. Normal left V4 segment. --Inferior cerebellar arteries: Normal. --Basilar artery: Normal. --Superior cerebellar arteries: Normal. --Posterior cerebral arteries: Right PCA mid P1 segment occlusion. Left PCA is normal. ANTERIOR CIRCULATION: --Intracranial internal carotid arteries: Normal. --Anterior cerebral arteries (ACA): Normal. --Middle cerebral arteries (MCA): Normal. ANATOMIC VARIANTS: None IMPRESSION: 1. Small acute infarct of the anterolateral right thalamus adjacent to the posterior limb of the right internal capsule. 2. Right PCA mid P1 segment occlusion. Loss of normal flow related enhancement in the right vertebral artery V4 segment. These results were called by telephone at the time of interpretation on 04/18/2021 at 10:26 pm to provider [facility] , who verbally acknowledged these results. Electronically Signed   By: Ulyses Jarred M.D.   On: 04/18/2021 22:26   ECHOCARDIOGRAM COMPLETE  Result Date: 04/19/2021     ECHOCARDIOGRAM REPORT   Patient Name:   [name] Date of Exam: 04/19/2021 Medical Rec #:  [id number]       Height:       68.0 in Accession #:    [id number]      Weight:       220.0 lb Date of Birth:  Mar 04, 1956      BSA:          2.128 m Patient Age:    89 years        BP:           132/69 mmHg Patient Gender: F               HR:           87 bpm. Exam Location:  Inpatient Procedure: 2D Echo, Cardiac Doppler and Color Doppler Indications:    CVA  History:        Patient has no prior history of Echocardiogram examinations.                 Risk Factors:Dyslipidemia, Diabetes and Hypertension.  Sonographer:    Luisa Hart RDCS Referring Phys: [id number] ASHISH ARORA IMPRESSIONS  1. Left ventricular ejection fraction, by estimation, is 70 to 75%. The left ventricle has hyperdynamic function. The left ventricle has no regional wall motion abnormalities. Left ventricular diastolic parameters are consistent with Grade I diastolic dysfunction (impaired relaxation). Elevated left atrial pressure.  2. Right ventricular systolic function is normal. The right ventricular size is normal. Tricuspid regurgitation signal is inadequate for assessing PA pressure.  3. Left atrial size was mildly dilated.  4. The mitral valve is normal in structure. Mild mitral valve regurgitation. No evidence of mitral stenosis.  5. The aortic valve is normal in structure. Aortic valve regurgitation is not visualized. No aortic stenosis is present.  6. The inferior vena cava is normal in size with greater than 50% respiratory variability, suggesting right atrial pressure of 3 mmHg. FINDINGS  Left Ventricle: Left ventricular ejection fraction, by estimation, is 70 to 75%. The left ventricle has hyperdynamic function. The left ventricle has no regional wall motion abnormalities. The left ventricular internal cavity size was normal in size. There is no left ventricular hypertrophy. Left ventricular diastolic parameters are consistent with Grade I  diastolic dysfunction (impaired relaxation). Elevated left atrial pressure. Right Ventricle: The right ventricular size is normal. No increase in right ventricular wall thickness. Right ventricular systolic function is normal. Tricuspid regurgitation signal is inadequate for assessing PA pressure. Left Atrium: Left atrial size was mildly dilated. Right Atrium: Right atrial size was normal in size. Pericardium: There is no evidence of pericardial effusion. Mitral Valve: The mitral valve is normal in structure. Mild mitral annular calcification. Mild mitral valve regurgitation. No evidence of mitral valve stenosis. Tricuspid Valve: The tricuspid valve is normal in structure. Tricuspid valve regurgitation is not demonstrated. No evidence of tricuspid stenosis. Aortic Valve: The aortic valve is normal in structure. Aortic valve regurgitation is not visualized. No aortic stenosis is present. Aortic valve mean gradient measures 7.0 mmHg. Aortic valve peak gradient measures 12.2 mmHg. Pulmonic Valve: The pulmonic valve was normal in structure. Pulmonic valve regurgitation is not visualized. No evidence of pulmonic stenosis. Aorta: The aortic root is normal in size and structure. Venous: The inferior vena cava is normal in size with greater than 50% respiratory variability, suggesting right atrial pressure of 3 mmHg. IAS/Shunts: No atrial level shunt detected by color flow Doppler.  LEFT VENTRICLE PLAX 2D LVIDd:         5.50 cm     Diastology LVIDs:         3.20 cm     LV e' medial:    5.77 cm/s LV PW:         1.00 cm     LV E/e' medial:  16.9 LV IVS:        1.00 cm     LV e' lateral:   5.33 cm/s                            LV E/e' lateral: 18.3  LV Volumes (MOD) LV vol d, MOD A2C: 57.3 ml LV vol d, MOD A4C: 83.9 ml LV vol s, MOD A2C: 7.2 ml LV vol s, MOD A4C: 18.5 ml LV SV MOD A2C:     50.1 ml LV SV MOD A4C:     83.9 ml LV SV MOD BP:      60.2 ml RIGHT VENTRICLE RV S prime:     17.10 cm/s  PULMONARY VEINS TAPSE (M-mode):  2.7 cm      A Reversal Duration: 85.00 msec                             A Reversal Velocity: 17.80 cm/s                             Diastolic Velocity:  81.01 cm/s  S/D Velocity:        1.50                             Systolic Velocity:   0000000 cm/s LEFT ATRIUM           Index       RIGHT ATRIUM           Index LA diam:      4.10 cm 1.93 cm/m  RA Area:     11.90 cm LA Vol (A2C): 59.4 ml 27.91 ml/m RA Volume:   25.00 ml  11.75 ml/m  AORTIC VALVE                    PULMONIC VALVE AV Vmax:           175.00 cm/s  PV Vmax:       1.27 m/s AV Vmean:          124.000 cm/s PV Vmean:      98.400 cm/s AV VTI:            0.360 m      PV VTI:        0.249 m AV Peak Grad:      12.2 mmHg    PV Peak grad:  6.5 mmHg AV Mean Grad:      7.0 mmHg     PV Mean grad:  4.0 mmHg LVOT Vmax:         98.40 cm/s LVOT Vmean:        73.100 cm/s LVOT VTI:          0.225 m LVOT/AV VTI ratio: 0.62  AORTA Ao Root diam: 2.80 cm Ao Asc diam:  3.00 cm MITRAL VALVE MV Area (PHT): 3.72 cm     SHUNTS MV Decel Time: 204 msec     Systemic VTI: 0.22 m MV E velocity: 97.30 cm/s MV A velocity: 150.00 cm/s MV E/A ratio:  0.65 Mihai Croitoru MD Electronically signed by Sanda Klein MD Signature Date/Time: 04/19/2021/2:58:20 PM    Final    CT HEAD CODE STROKE WO CONTRAST  Result Date: 04/18/2021 CLINICAL DATA:  Code stroke. New onset of dizziness and left-sided weakness. Last known well 45 minutes ago. EXAM: CT HEAD WITHOUT CONTRAST TECHNIQUE: Contiguous axial images were obtained from the base of the skull through the vertex without intravenous contrast. COMPARISON:  CT head without contrast 11/05/2010 FINDINGS: Brain: No acute infarct, hemorrhage, or mass lesion is present. No significant white matter lesions are present. The ventricles are of normal size. Basal ganglia are intact. Insular ribbon is normal. No acute or focal cortical abnormality is present. No significant extraaxial fluid collection is present. Vascular:  Atherosclerotic calcifications are present within the cavernous internal carotid arteries. No hyperdense vessel is present. Skull: Calvarium is intact. No focal lytic or blastic lesions are present. Sinuses/Orbits: The paranasal sinuses and mastoid air cells are clear. Bilateral lens replacements are noted. Globes and orbits are otherwise unremarkable. ASPECTS [facility] Stroke Program Early CT Score) - Ganglionic level infarction (caudate, lentiform nuclei, internal capsule, insula, M1-M3 cortex): 7/7 - Supraganglionic infarction (M4-M6 cortex): 3/3 Total score (0-10 with 10 being normal): 10/10 IMPRESSION: 1. Normal CT appearance of the brain. 2. ASPECTS is 10/10. These results were called by telephone at the time of interpretation on 04/18/2021 at 6:50 pm to provider [facility] , who verbally acknowledged these results. Electronically Signed   By: Harrell Gave  Mattern M.D.   On: 04/18/2021 18:50   CT ANGIO HEAD NECK W WO CM W PERF (CODE STROKE)  Result Date: 04/18/2021 CLINICAL DATA:  Stroke follow-up.  Left-sided weakness EXAM: CT ANGIOGRAPHY HEAD AND NECK CT PERFUSION BRAIN TECHNIQUE: Multidetector CT imaging of the head and neck was performed using the standard protocol during bolus administration of intravenous contrast. Multiplanar CT image reconstructions and MIPs were obtained to evaluate the vascular anatomy. Carotid stenosis measurements (when applicable) are obtained utilizing NASCET criteria, using the distal internal carotid diameter as the denominator. Multiphase CT imaging of the brain was performed following IV bolus contrast injection. Subsequent parametric perfusion maps were calculated using RAPID software. CONTRAST:  172mL OMNIPAQUE IOHEXOL 350 MG/ML SOLN COMPARISON:  MRA head 04/18/2021 FINDINGS: CTA NECK FINDINGS SKELETON: There is no bony spinal canal stenosis. No lytic or blastic lesion. OTHER NECK: Normal pharynx, larynx and major salivary glands. No cervical lymphadenopathy.  Unremarkable thyroid gland. UPPER CHEST: No pneumothorax or pleural effusion. No nodules or masses. AORTIC ARCH: There CS calcific atherosclerosis of the aortic arch. There is no aneurysm, dissection or hemodynamically significant stenosis of the visualized portion of the aorta. Conventional 3 vessel aortic branching pattern. Mild narrowing of the proximal left subclavian artery. RIGHT CAROTID SYSTEM: No dissection, occlusion or aneurysm. There is mixed density atherosclerosis extending into the proximal ICA, resulting in 50% stenosis. LEFT CAROTID SYSTEM: No dissection, occlusion or aneurysm. Mild atherosclerotic calcification at the carotid bifurcation without hemodynamically significant stenosis. VERTEBRAL ARTERIES: Left dominant configuration. Both origins are clearly patent. There is no dissection, occlusion or flow-limiting stenosis to the skull base (V1-V3 segments). CTA HEAD FINDINGS POSTERIOR CIRCULATION: --Vertebral arteries: The right vertebral artery terminates in PICA. The vessel originating from the proximal right basilar is the anterior inferior cerebellar artery. --Inferior cerebellar arteries: Normal. --Basilar artery: Normal. --Superior cerebellar arteries: Normal. --Posterior cerebral arteries (PCA): Right P1 segment is occluded. Normal left PCA. ANTERIOR CIRCULATION: --Intracranial internal carotid arteries: Normal. --Anterior cerebral arteries (ACA): Normal. Both A1 segments are present. Patent anterior communicating artery (a-comm). --Middle cerebral arteries (MCA): Normal. VENOUS SINUSES: As permitted by contrast timing, patent. ANATOMIC VARIANTS: None Review of the MIP images confirms the above findings. CT Brain Perfusion Findings: CBF (<30%) Volume: 53mL Perfusion (Tmax>6.0s) volume: 30mL Mismatch Volume: 4mL Infarction Location:No completed infarct visible via CT perfusion. On the earlier MRI, there is a right thalamic infarct. The penumbra is located in the right occipital lobe.  IMPRESSION: 1. Occlusion of the right posterior cerebral artery P1 segment with 13 mL area of ischemic penumbra in the right occipital lobe. 2. 50 % stenosis of the proximal right internal carotid artery secondary to mixed density atherosclerosis. Aortic Atherosclerosis (ICD10-I70.0). These results were communicated to Dr. Amie Portland at 11:47 pm on 04/18/2021 by text page via the [facility] messaging system. Electronically Signed   By: Ulyses Jarred M.D.   On: 04/18/2021 23:48    Labs:  CBC: Recent Labs    09/20/20 1132 04/18/21 1821  WBC 7.1 9.0  HGB 13.4 12.9  HCT 39.4 38.0  PLT 266 259    COAGS: Recent Labs    04/18/21 1821  INR 1.0  APTT 28    BMP: Recent Labs    09/20/20 1132 04/18/21 1821 04/20/21 0943  NA 135 133* 132*  K 4.0 4.6 3.9  CL 95* 96* 99  CO2 25 27 24   GLUCOSE 146* 272* 256*  BUN 14 25* 10  CALCIUM 9.8 9.0 8.8*  CREATININE 0.87 1.09* 0.89  GFRNONAA 70 56* >60  GFRAA 81  --   --     LIVER FUNCTION TESTS: Recent Labs    09/20/20 1132 04/18/21 1821  BILITOT 0.4 0.6  AST 18 23  ALT 15 16  ALKPHOS 38 45  PROT 7.7 8.0  ALBUMIN 4.4 4.0    Assessment and Plan:  History of acute CVA s/p cerebral arteriogram with emergent mechanical thrombectomy of right PCA P2 occlusion achieving a TICI 3 revascularization via left radial approach 04/19/2021 by Dr. Karenann Cai. Awake/alert and following simple commands, moving all extremities with weakness of left side, mild LUE drift. Left radial puncture site stable, distal pulses (radial) palpable bilaterally. Further plans per neurology- appreciate and agree with management. Please call NIR with questions/concerns.   Electronically Signed: Earley Abide, PA-C 04/21/2021, 9:08 AM   I spent a total of 15 Minutes at the the patient's bedside AND on the patient's hospital floor or unit, greater than 50% of which was counseling/coordinating care for CVA s/p revascularization.

## 2021-04-22 LAB — GLUCOSE, CAPILLARY
Glucose-Capillary: 253 mg/dL — ABNORMAL HIGH (ref 70–99)
Glucose-Capillary: 272 mg/dL — ABNORMAL HIGH (ref 70–99)
Glucose-Capillary: 230 mg/dL — ABNORMAL HIGH (ref 70–99)
Glucose-Capillary: 239 mg/dL — ABNORMAL HIGH (ref 70–99)

## 2021-04-22 MED ORDER — LOSARTAN POTASSIUM 25 MG PO TABS
25.0000 mg | ORAL_TABLET | Freq: Two times a day (BID) | ORAL | Status: DC
  Administered 2021-04-22 – 2021-04-29 (×14): 25 mg via ORAL
  Filled 2021-04-22 (×14): qty 1

## 2021-04-22 MED ORDER — INSULIN ASPART 100 UNIT/ML IJ SOLN
5.0000 [IU] | Freq: Three times a day (TID) | INTRAMUSCULAR | Status: DC
  Administered 2021-04-22 – 2021-04-29 (×17): 5 [IU] via SUBCUTANEOUS

## 2021-04-22 MED ORDER — INSULIN DETEMIR 100 UNIT/ML ~~LOC~~ SOLN
12.0000 [IU] | Freq: Two times a day (BID) | SUBCUTANEOUS | Status: DC
  Administered 2021-04-22 – 2021-04-29 (×14): 12 [IU] via SUBCUTANEOUS
  Filled 2021-04-22 (×16): qty 0.12

## 2021-04-22 MED ORDER — FLEET ENEMA 7-19 GM/118ML RE ENEM
1.0000 | ENEMA | Freq: Once | RECTAL | Status: AC
  Administered 2021-04-22: 1 via RECTAL
  Filled 2021-04-22: qty 1

## 2021-04-22 NOTE — Progress Notes (Signed)
STROKE TEAM PROGRESS NOTE    INTERVAL HISTORY No acute events No new complaints or concerns today. VS overall stable with some hypertension. Neuro exam stable. Has been recommended for and accepted to CIR. Insurance auth underway.   OBJECTIVE Vitals:   04/21/21 2314 04/22/21 0305 04/22/21 0721 04/22/21 1119  BP: (!) 172/75 (!) 167/78 (!) 180/80 (!) 182/76  Pulse: 70 74    Resp: 17 16 18 16   Temp: 98.7 F (37.1 C) 98.2 F (36.8 C) 98.2 F (36.8 C) 98.7 F (37.1 C)  TempSrc: Oral Oral Axillary Oral  SpO2: 96% 97% 96% 95%  Weight:      Height:        CBC:  Recent Labs  Lab 04/18/21 1821  WBC 9.0  NEUTROABS 5.7  HGB 12.9  HCT 38.0  MCV 84.1  PLT 269    Basic Metabolic Panel:  Recent Labs  Lab 04/20/21 0943 04/21/21 0834  NA 132* 136  K 3.9 4.0  CL 99 101  CO2 24 27  GLUCOSE 256* 197*  BUN 10 13  CREATININE 0.89 0.88  CALCIUM 8.8* 9.2    Lipid Panel:     Component Value Date/Time   CHOL 195 04/19/2021 0427   CHOL 233 (H) 09/20/2020 1132   CHOL 205 (H) 09/06/2013 1518   TRIG 221 (H) 04/19/2021 0427   TRIG 174 09/06/2013 1518   HDL 46 04/19/2021 0427   HDL 47 09/20/2020 1132   HDL 53 09/06/2013 1518   CHOLHDL 4.2 04/19/2021 0427   VLDL 44 (H) 04/19/2021 0427   VLDL 35 09/06/2013 1518   LDLCALC 105 (H) 04/19/2021 0427   LDLCALC 152 (H) 09/20/2020 1132   LDLCALC 117 (H) 09/06/2013 1518   HgbA1c:  Lab Results  Component Value Date   HGBA1C 9.3 (H) 04/19/2021   Urine Drug Screen: No results found for: LABOPIA, COCAINSCRNUR, LABBENZ, AMPHETMU, THCU, LABBARB  Alcohol Level No results found for: ETH  IMAGING  MR BRAIN WO CONTRAST MR ANGIO HEAD WO CONTRAST 04/18/2021 IMPRESSION:  1. Small acute infarct of the anterolateral right thalamus adjacent to the posterior limb of the right internal capsule.  2. Right PCA mid P1 segment occlusion. Loss of normal flow related enhancement in the right vertebral artery V4 segment.  MR BRAIN WO  CONTRAST 04/19/2021 IMPRESSION:  1. Stable acute nonhemorrhagic infarct involving the lateral aspect of the right thalamus.  2. Additional acute nonhemorrhagic infarcts involving the more superior and medial right thalamus, the right occipital lobe, the left side of the splenium of the corpus callosum, and the right cerebellum.  3. Stable white matter disease. This likely reflects the sequela of chronic microvascular ischemia.    ECHOCARDIOGRAM COMPLETE 04/19/2021 IMPRESSIONS   1. Left ventricular ejection fraction, by estimation, is 70 to 75%. The left ventricle has hyperdynamic function. The left ventricle has no regional wall motion abnormalities. Left ventricular diastolic parameters are consistent with Grade I diastolic dysfunction (impaired relaxation). Elevated left atrial pressure.   2. Right ventricular systolic function is normal. The right ventricular size is normal. Tricuspid regurgitation signal is inadequate for assessing PA pressure.   3. Left atrial size was mildly dilated.   4. The mitral valve is normal in structure. Mild mitral valve regurgitation. No evidence of mitral stenosis.   5. The aortic valve is normal in structure. Aortic valve regurgitation is not visualized. No aortic stenosis is present.   6. The inferior vena cava is normal in size with greater than 50% respiratory variability,  suggesting right atrial pressure of 3 mmHg.   CT HEAD CODE STROKE WO CONTRAST 04/18/2021 IMPRESSION:  1. Normal CT appearance of the brain.  2. ASPECTS is 10/10.   CT ANGIO HEAD NECK W WO CM W PERF (CODE STROKE) 04/18/2021 IMPRESSION:  1. Occlusion of the right posterior cerebral artery P1 segment with 13 mL area of ischemic penumbra in the right occipital lobe.  2. 50 % stenosis of the proximal right internal carotid artery secondary to mixed density atherosclerosis.  Aortic Atherosclerosis (ICD10-I70.0).   ECG - SR rate 82 BPM. Possible old MI. (See cardiology reading for complete  details)  PHYSICAL EXAM Blood pressure (!) 182/76, pulse 74, temperature 98.7 F (37.1 C), temperature source Oral, resp. rate 16, height 5\' 8"  (1.727 m), weight 99 kg, SpO2 95 %.  GENERAL: Pleasant middle-aged Caucasian lady she is awake and alert and responsive.  HEENT: This is normal.  ABDOMEN: soft  EXTREMITIES: No edema   BACK: Normal  SKIN: Normal by inspection.    MENTAL STATUS: Alert and oriented. Speech, language and cognition are generally intact. Judgment and insight normal.   CRANIAL NERVES: Pupils are equal, round and reactive to light and accomodation; extra ocular movements are full, there is no significant nystagmus; visual fields are full; there is flattening of the nasolabial fold on left side; tongue is midline  MOTOR: Direct strength is 5/5 but there is left hemiparesis with left upper extremity 2/5 and left lower extremity 3/5 strength with drift on both..  Right side is normal.  COORDINATION: There is significant dysmetria of the left upper extremity and left lower extremity.  SENSATION: There is reduced sensation to temperature light touch along the left upper extremity and left leg.  ASSESSMENT/PLAN [name] is a 65 y.o. female with history of poorly controlled diabetes and hypertension, hyperlipidemia, obesity, anxiety, depression, presenting to [facility] with acute dizziness left-sided weakness, left-sided numbness, and possible visual deficits. She received IV tPA  via telemedicine at [place] hrs. 04/18/2021 and was found to have occlusion of the right P2/PCA and was taken to IR for thrombectomy. MT performed with combined stentriever and aspiration. Total of 1 pass with complete recanalization (TICI3). No hemorrhagic complication on flat panel head CT. [name].  Stroke: acute nonhemorrhagic lateral aspect of the right thalamus. Acute nonhemorrhagic infarcts involving the more superior and medial right thalamus,  the right occipital lobe, the left side of the splenium of the corpus callosum, and the right cerebellum - embolic - etiology unknown.  Resultant dysmetria involving the left side and left-sided numbness.  Code Stroke CT Head - Normal CT appearance of the brain. ASPECTS is 10/10.   CT head - not ordered  MRI head - 04/18/21 - Small acute infarct of the anterolateral right thalamus adjacent to the posterior limb of the right internal capsule.   MRI head - 04/19/21 - Stable acute nonhemorrhagic infarct involving the lateral aspect of the right thalamus. Additional acute nonhemorrhagic infarcts involving the more superior and medial right thalamus, the right occipital lobe, the left side of the splenium of the corpus callosum, and the right cerebellum. Stable white matter disease. This likely reflects the sequela of chronic microvascular ischemia.    MRA head - Right PCA mid P1 segment occlusion. Loss of normal flow related enhancement in the right vertebral artery V4 segment.  CTA H&N - 50 % stenosis of the proximal right internal carotid artery secondary to mixed density atherosclerosis.  CT Perfusion - Occlusion of the right posterior cerebral artery P1 segment with 13 mL area of ischemic penumbra in the right occipital lobe.   Carotid Doppler - CTA neck ordered - carotid dopplers not indicated.  2D Echo - EF 70 to 75%. No cardiac source of emboli identified.   TEE and LOOP requested and pending  [employer] Virus 2 - negative  LDL - 105  HgbA1c - 9.3  UDS - not ordered  VTE prophylaxis - SCDs Diet: Carb restricted  No antithrombotic or antiplatelet prior to admission, now on  aspirin 81 mg daily and Plavix 75 mg daily x 3 weeks   Patient will be counseled to be compliant with her antithrombotic medications  Ongoing aggressive stroke risk factor management  Therapy recommendations:  CIR recommended -  accepted with insurance pending  Disposition:  CIR  pending  Hypertension  Home BP meds: Cozaar and HCTZ  Current BP meds: On home cozaar, normodyne prn  Stable, hypertensive   Permissive hypertension bp less than 180 . Long-term BP goal normotensive  Hyperlipidemia  Home Lipid lowering medication: none  LDL 105, goal < 70  Current lipid lowering medication: will add Lipitor 40 mg daily  Continue statin at discharge  Diabetes  Home diabetic meds: Actos 45, Januvia 100, Glucotrol 5 mg,  Metformin 1000mg   bid  Current diabetic meds: insulin  HgbA1c 9.3, goal < 7.0 Recent Labs    04/21/21 2117 04/22/21 0608 04/22/21 1225  GLUCAP 237* 253* 272*  Diabetes coordinator consult appreciated, will add Levimir 10units BID, Novolog meal time coverage of 3 units, SSI to be changed to  moderate scale  Other Stroke Risk Factors  Advanced age  Former cigarette smoker - quit  ETOH use, advised to drink no more than 1 alcoholic beverage per day.  Obesity, recommend weight loss, diet and exercise as appropriate   Other Active Problems, Findings, Recommendations and/or Plan  Code status - Full Code  Aortic Atherosclerosis (ICD10-I70.0).  CKD - stage  3a - creatinine - 1.09  Hospital day # 3  Continue ongoing therapies.  Await TEE and loop recorder.     Continue aspirin Plavix for 3 weeks followed by aspirin alone.  Transfer to rehab when bed available.    Antony Contras, MD Medical Director [place] Pager: [phone] 04/22/2021 3:08 PM  To contact Stroke Continuity provider, please refer to [email or web address] After hours, contact General Neurology

## 2021-04-22 NOTE — Consult Note (Signed)
   [facility] Inpatient Consult   04/22/2021  [name] 03/05/1956 [id number]    [place] Organization [ACO] Patient:  [name] plan  Patient is currently assigned as pending follow up for a post hospital [name] Coordinator follow up for community support and care/disease management needs for the [name].  Electronic chart review reveals patient is being recommended for an inpatient rehabilitation level of care for disposition.  Plan: Continue to follow for progress and transition for needs and plan and follow with inpatient [facility] team.   For additional questions or referrals please contact:   Natividad Brood, RN BSN [facility] Liaison  [phone] business mobile phone Toll free office [phone]  Fax number: [phone] [email or web address]

## 2021-04-22 NOTE — Evaluation (Signed)
Speech Language Pathology Evaluation Patient Details Name: [name] MRN: [id number] DOB: Aug 23, 1956 Today's Date: 04/22/2021 Time: 5176-1607 SLP Time Calculation (min) (ACUTE ONLY): 45 min  Problem List:  Patient Active Problem List   Diagnosis Date Noted  . Acute ischemic stroke ([place]) 04/19/2021  . Ischemic stroke ([place]) 04/18/2021  . Poorly controlled type 2 diabetes mellitus ([place]) 04/18/2021  . Hyperlipidemia 04/18/2021  . AKI (acute kidney injury) ([place]) 04/18/2021  . Hypertensive urgency 04/18/2021  . Recurrent major depressive disorder, in partial remission ([place]) 09/20/2020  . Status post total replacement of hip 04/04/2018  . Primary osteoarthritis of right hip 02/20/2018  . Allergic rhinitis 10/28/2015  . Acute stress disorder 08/01/2015  . Anxiety 08/01/2015  . Clinical depression 08/01/2015  . Diabetes ([place]) 08/01/2015  . Diverticulosis of colon 08/01/2015  . Generalized pruritus 08/01/2015  . BP (high blood pressure) 08/01/2015  . Cannot sleep 08/01/2015  . Adiposity 08/01/2015  . Detrusor muscle hypertonia 08/01/2015  . Hypercholesterolemia without hypertriglyceridemia 08/01/2015  . Female stress incontinence 08/01/2015  . Avitaminosis D 08/01/2015   Past Medical History:  Past Medical History:  Diagnosis Date  . Anxiety   . Arthritis   . Cataract    left  . Depression   . Diabetes mellitus without complication ([place])   . Environmental and seasonal allergies   . GERD (gastroesophageal reflux disease)   . Hyperlipidemia   . Hypertension   . Joint pain    as reported by patient  . Urinary incontinence    as stated by patient   Past Surgical History:  Past Surgical History:  Procedure Laterality Date  . BREAST EXCISIONAL BIOPSY Right 1999   neg  . BREAST LUMPECTOMY Right 2005   as reported by patient  . CATARACT EXTRACTION  January 2013  . CATARACT EXTRACTION W/PHACO Left 12/02/2016   Procedure: CATARACT EXTRACTION PHACO AND INTRAOCULAR LENS  PLACEMENT (IOC);  Surgeon: Estill Cotta, MD;  Location: [facility] ORS;  Service: Ophthalmology;  Laterality: Left;  [place] 2:14 AP% 28.4 CDE 59.73 Fluid pack lot # [id number]  . DIAGNOSTIC LAPAROSCOPY    . IR CT HEAD LTD  04/19/2021  . IR PERCUTANEOUS ART THROMBECTOMY/INFUSION INTRACRANIAL INC DIAG ANGIO  04/19/2021      . IR PERCUTANEOUS ART THROMBECTOMY/INFUSION INTRACRANIAL INC DIAG ANGIO  04/19/2021  . IR US GUIDE VASC ACCESS LEFT  04/19/2021  . RADIOLOGY WITH ANESTHESIA N/A 04/19/2021   Procedure: IR WITH ANESTHESIA;  Surgeon: Luanne Bras, MD;  Location: [place];  Service: Radiology;  Laterality: N/A;  . TOTAL HIP ARTHROPLASTY Right 04/04/2018   Procedure: TOTAL HIP ARTHROPLASTY;  Surgeon: Dereck Leep, MD;  Location: [facility] ORS;  Service: Orthopedics;  Laterality: Right;   HPI:  65 yo female adm to [facility] with left visual field deficits, dizziness, and left sided weakness - Pt diagnosed with right thalamic and right cerebellum CVA as well as left splenium of corpus collasum.  Pt with h/o GERD, HTN, HLD, pain, depression, urinary incontenence and cataract.  Pt works at [employer] in [employer] [place], lives alone and daughter/boyfriend/grandchildren live behind her.  Speech evaluation ordered.   Assessment / Plan / Recommendation Clinical Impression  SLP administered SLUMS with pt scoring being 27/30 - WFL (25/30 normal for high school education).  She demonstrates minimal left facial asymmetry (decreased left nasolabial fold) otherwise not significant CN deficits.  Pt's articulation is baseline per her report and no significant dysarthria noted. She was sleepy during testing and required frequent cues to remain alert -  and still performed well on the SLUMS.   Difficulty in recalling five words noted with pt needing cues for 2 of them.  Strengths in word fluency, orientation, recall of detail, etc.  Pt did demonstrate minimal decreased sustained atteniton during testing - and thus will recommend follow  up at next venue of care for further cognitive linguistic diagnosist/treatment to assure she is able to manage her own activities including bills paying, medication, appointments, etc.  No acute follow up needed with this pt.  She was educated to findings and agreeable to diagnostic testing of higher level cognition at next venue.  Thanks for this consult.    SLP Assessment  SLP Recommendation/Assessment: All further Speech Lanaguage Pathology  needs can be addressed in the next venue of care SLP Visit Diagnosis: Cognitive communication deficit (R41.841)    Follow Up Recommendations  Inpatient Rehab    Frequency and Duration      TBD     SLP Evaluation Cognition  Overall Cognitive Status: Within Functional Limits for tasks assessed Arousal/Alertness: Awake/alert Orientation Level: Oriented X4 Memory: Impaired (3/5 items recalled without cues, 2 with cues) Memory Impairment: Retrieval deficit Awareness: Appears intact Problem Solving: Appears intact Safety/Judgment: Appears intact Comments: Pt demonstrates good problem solving skills as she asked SLP for assist with meal orders and offered for SLP to write order to help SLP recall       Comprehension  Auditory Comprehension Overall Auditory Comprehension: Appears within functional limits for tasks assessed Yes/No Questions: Not tested Commands: Within Functional Limits Conversation: Complex Interfering Components: Attention (pt sleepy) Visual Recognition/Discrimination Discrimination: Within Function Limits Reading Comprehension Reading Status: Within funtional limits (for list of food items)    Expression Expression Primary Mode of Expression: Verbal Verbal Expression Overall Verbal Expression: Appears within functional limits for tasks assessed Initiation: No impairment Repetition: No impairment Naming: Not tested Interfering Components: Attention Written Expression Dominant Hand: Right Written Expression: Not tested    Oral / Motor  Oral Motor/Sensory Function Overall Oral Motor/Sensory Function: Within functional limits Motor Speech Overall Motor Speech: Appears within functional limits for tasks assessed Respiration: Within functional limits Phonation: Normal Resonance: Within functional limits Articulation: Within functional limitis Intelligibility: Intelligible Motor Planning: Witnin functional limits Motor Speech Errors: Not applicable   GO                    Macario Golds 04/22/2021, 10:47 AM  Kathleen Lime, MS [facility] SLP Acute Rehab Services Office [phone] Pager [phone]

## 2021-04-22 NOTE — PMR Pre-admission (Addendum)
PMR Admission Coordinator Pre-Admission Assessment  Patient: [name] is an 65 y.o., female MRN: [id number] DOB: Aug 28, 1956 Height: 5' 8"  (172.7 cm) Weight: 99 kg              Insurance Information HMO:     PPO: yes     PCP:      IPA:      80/20:      OTHER:  PRIMARY[employer]val [employer]      Policy#: [id number]      Subscriber: pt CM Name: [name]      Phone#: [phone]     Fax#: [phone] Pre-Cert#: [id number] auth for CIR via fax approval with updates due to fax listed above on 5/20      Employer: [employer] Benefits:  Phone #:      Name:  Eff. Date: 12/14/16     Deduct: $300 ($0 met)      Out of Pocket Max: $7900 ($487.06)      Life Max: n/a  CIR: 60%      SNF:  60% Outpatient: 60%     Co-Ins: 40% Home Health: 60%      Co-Ins: 40% DME: 60%     Co-Ins: 40% Providers: preferred network SECONDARY: Medicare Part A     Policy#: [id number]      Phone#:   Development worker, community:       Phone#:   The "Data Collection Information Summary" for patients in Inpatient Rehabilitation Facilities with attached "Privacy Act [place] Records" was provided and verbally reviewed with: N/A  Emergency Contact Information Contact Information    Name Relation Home Work Mobile   Weir L Daughter [phone]     Tressia Danas [phone]       Current Medical History  Patient Admitting Diagnosis: R thalamic infarct History of Present Illness: [name] is a 65 year old right-handed female with history of uncontrolled diabetes mellitus, hypertension, hyperlipidemia, remote tobacco abuse, obesity with BMI 33.45, anxiety/depression.   Presented 04/19/2021 with acute onset of left-sided weakness and poor balance as well as dizziness.  CT/MRI as well as MRA showed small acute infarct of the anterior lateral right thalamus adjacent to the posterior limb of the right internal capsule.  Right PCA and P1 segment occlusion.  Patient did receive tPA.  Admission chemistries  unremarkable except sodium 133 chloride 96 glucose 272 BUN 25 creatinine 1.09 hemoglobin A1c 9.3.  Patient did undergo thrombectomy 04/19/2021 with complete recanalization per interventional radiology.  Echocardiogram with ejection fraction of 70 to 75% no wall motion abnormalities grade 1 diastolic dysfunction.  Currently maintained on aspirin 81 mg daily and Plavix 75 mg daily for CVA prophylaxis.  Tolerating a regular diet.  Due to patient's left-sided weakness and decreased functional mobility she was recommended for a comprehensive rehab program.  Complete NIHSS TOTAL: 7 Glasgow Coma Scale Score: 15  Past Medical History  Past Medical History:  Diagnosis Date  . Anxiety   . Arthritis   . Cataract    left  . Depression   . Diabetes mellitus without complication ([place])   . Environmental and seasonal allergies   . GERD (gastroesophageal reflux disease)   . Hyperlipidemia   . Hypertension   . Joint pain    as reported by patient  . Urinary incontinence    as stated by patient    Family History  family history includes Alzheimer's disease in her paternal grandmother; Aneurysm in her mother; Bone cancer in her brother; CAD in her father; Cancer  in her father; Colon cancer in her father; Dementia in her paternal grandmother; Diabetes in her father; Healthy in her daughter and son; Heart failure in her father; Mental illness in her paternal grandfather.  Prior Rehab/Hospitalizations:  Has the patient had prior rehab or hospitalizations prior to admission? No  Has the patient had major surgery during 100 days prior to admission? No  Current Medications   Current Facility-Administered Medications:  .  0.9 %  sodium chloride infusion, , Intravenous, Continuous, Rosalin Hawking, MD, Stopped at 04/24/21 2341 .  acetaminophen (TYLENOL) tablet 650 mg, 650 mg, Oral, Q4H PRN, 650 mg at 04/27/21 2119 **OR** acetaminophen (TYLENOL) 160 MG/5ML solution 650 mg, 650 mg, Per Tube, Q4H PRN **OR**  acetaminophen (TYLENOL) suppository 650 mg, 650 mg, Rectal, Q4H PRN, Amie Portland, MD .  aspirin chewable tablet 81 mg, 81 mg, Oral, Daily, [place], Kofi, MD, 81 mg at 04/28/21 0802 .  atorvastatin (LIPITOR) tablet 40 mg, 40 mg, Oral, Daily, Rinehuls, David L, PA-C, 40 mg at 04/28/21 0800 .  clopidogrel (PLAVIX) tablet 75 mg, 75 mg, Oral, Q breakfast, [place], Kofi, MD, 75 mg at 04/28/21 0759 .  guaiFENesin (ROBITUSSIN) 100 MG/5ML solution 100 mg, 5 mL, Oral, Q4H PRN, Bailey-Modzik, Delila A, NP, 100 mg at 04/28/21 0420 .  insulin aspart (novoLOG) injection 0-15 Units, 0-15 Units, Subcutaneous, TID WC, Bailey-Modzik, Delila A, NP, 2 Units at 04/28/21 0650 .  insulin aspart (novoLOG) injection 0-5 Units, 0-5 Units, Subcutaneous, QHS, Bailey-Modzik, Delila A, NP, 3 Units at 04/25/21 2300 .  insulin aspart (novoLOG) injection 5 Units, 5 Units, Subcutaneous, TID WC, Bailey-Modzik, Delila A, NP, 5 Units at 04/28/21 0800 .  insulin detemir (LEVEMIR) injection 12 Units, 12 Units, Subcutaneous, BID, Bailey-Modzik, Delila A, NP, 12 Units at 04/28/21 0800 .  labetalol (NORMODYNE) injection 10 mg, 10 mg, Intravenous, Q2H PRN, Merlene Laughter, Kofi, MD, 10 mg at 04/23/21 2344 .  labetalol (NORMODYNE) injection 20 mg, 20 mg, Intravenous, Once **AND** [DISCONTINUED] clevidipine (CLEVIPREX) infusion 0.5 mg/mL, 0-21 mg/hr, Intravenous, Continuous, Amie Portland, MD, Last Rate: 16 mL/hr at 04/19/21 0255, 8 mg/hr at 04/19/21 0255 .  losartan (COZAAR) tablet 25 mg, 25 mg, Oral, BID, Bailey-Modzik, Delila A, NP, 25 mg at 04/28/21 0801 .  melatonin tablet 3 mg, 3 mg, Oral, QHS PRN, Greta Doom, MD, 3 mg at 04/27/21 2119 .  pantoprazole (PROTONIX) EC tablet 40 mg, 40 mg, Oral, QHS, [place], Kofi, MD, 40 mg at 04/27/21 2119 .  senna-docusate (Senokot-S) tablet 1 tablet, 1 tablet, Oral, QHS PRN, Amie Portland, MD, 1 tablet at 04/26/21 1057 .  sodium phosphate (FLEET) 7-19 GM/118ML enema 1 enema, 1 enema, Rectal,  Daily PRN, Rosalin Hawking, MD  Patients Current Diet:  Diet Order            Diet NPO time specified  Diet effective now                 Precautions / Restrictions Precautions Precautions: Fall Restrictions Weight Bearing Restrictions: No   Has the patient had 2 or more falls or a fall with injury in the past year?No  Prior Activity Level Community (5-7x/wk): working at [employer] in [employer], driving, no DME  Prior Functional Level Prior Function Level of Independence: Independent Comments: still wokring, driving, wears readers  Self Care: Did the patient need help bathing, dressing, using the toilet or eating?  Independent  Indoor Mobility: Did the patient need assistance with walking from room to room (with or without device)? Independent  Stairs: Did the patient need assistance with internal or external stairs (with or without device)? Independent  Functional Cognition: Did the patient need help planning regular tasks such as shopping or remembering to take medications? Independent  Home Assistive Devices / Equipment Home Assistive Devices/Equipment: Dentures (specify type) (partial) Home Equipment: None  Prior Device Use: Indicate devices/aids used by the patient prior to current illness, exacerbation or injury? None of the above  Current Functional Level Cognition  Arousal/Alertness: Awake/alert Overall Cognitive Status: Impaired/Different from baseline Current Attention Level: Sustained Orientation Level: Oriented X4 Following Commands: Follows one step commands consistently Safety/Judgement: Decreased awareness of safety,Decreased awareness of deficits General Comments: patient impulsive this session and demos decreased awareness of deficits with transfers. Cues for attending to task as patient is easily distracted Memory: Impaired (3/5 items recalled without cues, 2 with cues) Memory Impairment: Retrieval deficit Awareness: Appears intact Problem  Solving: Appears intact Safety/Judgment: Appears intact Comments: Pt demonstrates good problem solving skills as she asked SLP for assist with meal orders and offered for SLP to write order to help SLP recall    Extremity Assessment (includes Sensation/Coordination)  Upper Extremity Assessment: Generalized weakness,RUE deficits/detail,LUE deficits/detail RUE Deficits / Details: 5/5 strength LUE Deficits / Details: 4/5 strength, dysmetria LUE Coordination: decreased fine motor,decreased gross motor  Lower Extremity Assessment: LLE deficits/detail LLE Deficits / Details: 5/5 strength, ataxic LLE Sensation:  (may need further assessment, pt reports intact) LLE Coordination: decreased gross motor    ADLs  Overall ADL's : Needs assistance/impaired Eating/Feeding: Supervision/ safety,Sitting Grooming: Wash/dry hands,Moderate assistance,Sitting Grooming Details (indicate cue type and reason): with LUE Upper Body Bathing: Moderate assistance,Sitting Lower Body Bathing: Maximal assistance,Sitting/lateral leans,Sit to/from stand,Bed level Upper Body Dressing : Moderate assistance,Sitting Lower Body Dressing: Maximal assistance,Sitting/lateral leans,Sit to/from stand,Cueing for safety Lower Body Dressing Details (indicate cue type and reason): assist to keep BLEs with figure 4 technique Toilet Transfer: Maximal assistance,+2 for physical assistance,+2 for safety/equipment,Stand-pivot,Cueing for safety,Cueing for sequencing Toilet Transfer Details (indicate cue type and reason): simulated to recliner Toileting- Clothing Manipulation and Hygiene: Maximal assistance,Sitting/lateral lean,Sit to/from stand,+2 for physical assistance,+2 for safety/equipment Functional mobility during ADLs: Maximal assistance,+2 for physical assistance,+2 for safety/equipment,Cueing for sequencing,Cueing for safety General ADL Comments: pt limited by decreased strength, decreased ability to care for self and decreased  coordination on L side.    Mobility  Overal bed mobility: Needs Assistance Bed Mobility: Supine to Sit Supine to sit: Mod assist Sit to supine: Mod assist General bed mobility comments: modA for trunk elevation, LE advancement, and repositioning hips at EOB. Patient sitting in soiled bed linens and pads    Transfers  Overall transfer level: Needs assistance Equipment used: 1 person hand held assist Transfers: Squat Pivot Transfers,Sit to/from Stand Sit to Stand: Mod assist Stand pivot transfers: Max assist,+2 physical assistance Squat pivot transfers: Max assist General transfer comment: modA for sit to stand x 2 for boost up and steady. Patient unable to advance R foot due to decreased weight shift to L. MaxA for squat pivot to recliner to the R. +2 would be beneficial for safety    Ambulation / Gait / Stairs / Wheelchair Mobility  Ambulation/Gait Ambulation/Gait assistance: Max assist,+2 physical assistance Gait Distance (Feet): 3 Feet Assistive device: 2 person hand held assist Gait Pattern/deviations: Step-to pattern,Decreased stride length,Decreased weight shift to left,Ataxic General Gait Details: pt with difficulty coordination of placement of LLE, increased assist to steady due to strong L lean. increased verbal cues for directioning and movement of LLE Gait velocity:  decreaseed Gait velocity interpretation: <1.31 ft/sec, indicative of household ambulator    Posture / Balance Dynamic Sitting Balance Sitting balance - Comments: posterior LOB 2x in sitting when distracted and performed LE motion, vc's to correct. Balance Overall balance assessment: Needs assistance Sitting-balance support: Feet supported,Single extremity supported Sitting balance-Leahy Scale: Fair Sitting balance - Comments: posterior LOB 2x in sitting when distracted and performed LE motion, vc's to correct. Postural control: Left lateral lean Standing balance support: Single extremity supported Standing  balance-Leahy Scale: Poor Standing balance comment: modA to maintain standing    Special needs/care consideration Diabetic management yes     Previous Home Environment (from acute therapy documentation) Living Arrangements: Alone  Lives With: Other (Comment) Available Help at Discharge: Family,Available 24 hours/day Type of Home: House Home Layout: One level Home Access: Stairs to enter Entrance Stairs-Rails: Right Entrance Stairs-Number of Steps: 2-3 Bathroom Shower/Tub: Chiropodist: [place]: No  Discharge Living Setting Plans for Discharge Living Setting: Lives with (comment),Patient's home (daughters) Type of Home at Discharge: House Discharge Home Layout: One level Discharge Home Access: Stairs to enter Entrance Stairs-Rails: Right Entrance Stairs-Number of Steps: 2-3 Discharge Bathroom Shower/Tub: Tub/shower unit Discharge Bathroom Toilet: Standard Discharge Bathroom Accessibility: Yes How Accessible: Accessible via walker Does the patient have any problems obtaining your medications?: No  Social/Family/Support Systems Anticipated Caregiver: daughter, Erasmo Downer Anticipated Caregiver's Contact Information: [phone] Ability/Limitations of Caregiver: works PT, significant other can cover when she's not available Caregiver Availability: 24/7 Discharge Plan Discussed with Primary Caregiver: Yes Is Caregiver In Agreement with Plan?: Yes Does Caregiver/Family have Issues with Lodging/Transportation while Pt is in Rehab?: No   Goals Patient/Family Goal for Rehab: PT/OT/SLP supervision Expected length of stay: 18-21 days Pt/Family Agrees to Admission and willing to participate: Yes Program Orientation Provided & Reviewed with Pt/Caregiver Including Roles  & Responsibilities: Yes  Barriers to Discharge: Insurance for SNF coverage   Decrease burden of Care through IP rehab admission: n/a  Possible need for SNF placement upon  discharge: not anticipated   Patient Condition: This patient's condition remains as documented in the consult dated 04/21/21, in which the Rehabilitation Physician determined and documented that the patient's condition is appropriate for intensive rehabilitative care in an inpatient rehabilitation facility. Will admit to inpatient rehab today.  Preadmission Screen Completed By:  Michel Santee, PT, 04/28/2021 10:31 AM ______________________________________________________________________   Discussed status with Dr. Ranell Patrick on 04/28/21  at 10:31 AM  and received approval for admission today.  Admission Coordinator:  Michel Santee, PT, DPT time 10:31 AM Sudie Grumbling 04/28/21

## 2021-04-22 NOTE — H&P (Signed)
Physical Medicine and Rehabilitation Admission H&P     HPI: [name] is a 65 year old right-handed female with history of uncontrolled diabetes mellitus, hypertension, hyperlipidemia, remote tobacco abuse, obesity with BMI 33.45, anxiety/depression.  Per chart review patient lives alone.  Independent prior to admission and still working at [employer].  1 level home 3 steps to entry.  She plans to stay with her daughter and son-in-law on discharge.  Presented 04/19/2021 with acute onset of left-sided weakness and poor balance as well as dizziness.  CT/MRI as well as MRA showed small acute infarct of the anterior lateral right thalamus adjacent to the posterior limb of the right internal capsule.  Right PCA and P1 segment occlusion.  Patient did receive tPA.  Admission chemistries unremarkable except sodium 133 chloride 96 glucose 272 BUN 25 creatinine 1.09 hemoglobin A1c 9.3.  Patient did undergo thrombectomy 04/19/2021 with complete recanalization per interventional radiology.  Echocardiogram with ejection fraction of 70 to 75% no wall motion abnormalities grade 1 diastolic dysfunction.  Currently maintained on aspirin 81 mg daily and Plavix 75 mg daily for CVA prophylaxis.  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  No clot./CT coronary morphology and loop recorder placement 04/24/2021.  Patient did develop a bit of a nonproductive cough chest x-ray showed no active disease and SARS coronavirus negative.  Follow-up swallow study completed and maintained on a regular diet.   Due to patient's left-sided weakness and decreased functional mobility she was admitted for a comprehensive rehab program.  Review of Systems  Constitutional: Negative for chills and fever.  HENT: Negative for hearing loss.   Eyes: Negative for blurred vision and double vision.  Respiratory: Negative  for cough and shortness of breath.   Cardiovascular: Negative for chest pain, palpitations and leg swelling.  Gastrointestinal: Positive for constipation. Negative for heartburn, nausea and vomiting.       GERD  Genitourinary: Negative for dysuria, flank pain and hematuria.       Stress incontinence  Musculoskeletal: Positive for myalgias.  Skin: Negative for rash.  Neurological: Positive for sensory change, speech change and weakness.  Psychiatric/Behavioral: Positive for depression.       Anxiety  All other systems reviewed and are negative.  Past Medical History:  Diagnosis Date  . Anxiety   . Arthritis   . Cataract    left  . Depression   . Diabetes mellitus without complication ([place])   . Environmental and seasonal allergies   . GERD (gastroesophageal reflux disease)   . Hyperlipidemia   . Hypertension   . Joint pain    as reported by patient  . Urinary incontinence    as stated by patient   Past Surgical History:  Procedure Laterality Date  . BREAST EXCISIONAL BIOPSY Right 1999   neg  . BREAST LUMPECTOMY Right 2005   as reported by patient  . BUBBLE STUDY  04/24/2021   Procedure: BUBBLE STUDY;  Surgeon: Sueanne Margarita, MD;  Location: [facility];  Service: Cardiovascular;;  . CATARACT EXTRACTION  January 2013  . CATARACT EXTRACTION W/PHACO Left 12/02/2016   Procedure: CATARACT EXTRACTION PHACO AND INTRAOCULAR LENS PLACEMENT (IOC);  Surgeon: Estill Cotta, MD;  Location: [facility];  Service: Ophthalmology;  Laterality: Left;  [place] 2:14 AP% 28.4 CDE 59.73 Fluid pack lot # [id number]  . DIAGNOSTIC LAPAROSCOPY    . IR CT HEAD  LTD  04/19/2021  . IR PERCUTANEOUS ART THROMBECTOMY/INFUSION INTRACRANIAL INC DIAG ANGIO  04/19/2021      . IR PERCUTANEOUS ART THROMBECTOMY/INFUSION INTRACRANIAL INC DIAG ANGIO  04/19/2021  . IR US GUIDE VASC ACCESS LEFT  04/19/2021  . RADIOLOGY WITH ANESTHESIA N/A 04/19/2021   Procedure: IR WITH ANESTHESIA;  Surgeon: Luanne Bras, MD;  Location:  [facility];  Service: Radiology;  Laterality: N/A;  . TEE WITHOUT CARDIOVERSION N/A 04/24/2021   Procedure: TRANSESOPHAGEAL ECHOCARDIOGRAM (TEE);  Surgeon: Sueanne Margarita, MD;  Location: [facility];  Service: Cardiovascular;  Laterality: N/A;  . TOTAL HIP ARTHROPLASTY Right 04/04/2018   Procedure: TOTAL HIP ARTHROPLASTY;  Surgeon: Dereck Leep, MD;  Location: [facility];  Service: Orthopedics;  Laterality: Right;   Family History  Problem Relation Age of Onset  . Bone cancer Brother   . Aneurysm Mother        brain  . Diabetes Father   . CAD Father   . Heart failure Father   . Colon cancer Father   . Cancer Father   . Alzheimer's disease Paternal Grandmother   . Dementia Paternal Grandmother   . Mental illness Paternal Grandfather   . Healthy Daughter   . Healthy Son   . Breast cancer Neg Hx    Social History:  reports that she quit smoking about 14 years ago. Her smoking use included cigarettes. She has a 37.50 pack-year smoking history. She has never used smokeless tobacco. She reports current alcohol use. She reports that she does not use drugs. Allergies:  Allergies  Allergen Reactions  . Jardiance [Empagliflozin] Itching    Recurrent mycotic infections  . Penicillins Rash and Other (See Comments)    Has patient had a PCN reaction causing immediate rash, facial/tongue/throat swelling, SOB or lightheadedness with hypotension: No Has patient had a PCN reaction causing severe rash involving mucus membranes or skin necrosis: No Has patient had a PCN reaction that required hospitalization No Has patient had a PCN reaction occurring within the last 10 years: No If all of the above answers are "NO", then may proceed with Cephalosporin use.    Medications Prior to Admission  Medication Sig Dispense Refill  . glipiZIDE (GLUCOTROL XL) 5 MG 24 hr tablet Take 5 mg by mouth daily with breakfast.    . hydrochlorothiazide (HYDRODIURIL) 25 MG tablet Take 25 mg by mouth daily.    Marland Kitchen losartan  (COZAAR) 25 MG tablet Take 25 mg by mouth 2 (two) times daily.    . metFORMIN (GLUCOPHAGE) 1000 MG tablet Take 1,000 mg by mouth 2 (two) times daily with a meal.    . Multiple Vitamin (MULTIVITAMIN) tablet Take 1 tablet by mouth daily.    . pioglitazone (ACTOS) 45 MG tablet Take 45 mg by mouth daily.    Marland Kitchen rOPINIRole (REQUIP) 1 MG tablet Take 1 mg by mouth at bedtime.    . sertraline (ZOLOFT) 100 MG tablet Take 100 mg by mouth daily.    . simvastatin (ZOCOR) 20 MG tablet Take 20 mg by mouth at bedtime.    . sitaGLIPtin (JANUVIA) 100 MG tablet Take 100 mg by mouth daily.      Drug Regimen Review Drug regimen was reviewed and remains appropriate with no significant issues identified  Home: Home Living Family/patient expects to be discharged to:: Private residence Living Arrangements: Alone Available Help at Discharge: Family,Available 24 hours/day Type of Home: House Home Access: Stairs to enter [employer] of Steps: 2-3 Entrance Stairs-Rails: Right Home Layout:  One level Bathroom Shower/Tub: Chiropodist: Standard Home Equipment: None  Lives With: Other (Comment)   Functional History: Prior Function Level of Independence: Independent Comments: still wokring, driving, wears readers  Functional Status:  Mobility: Bed Mobility Overal bed mobility: Needs Assistance Bed Mobility: Supine to Sit Supine to sit: Mod assist Sit to supine: Mod assist General bed mobility comments: modA for trunk elevation, LE advancement, and repositioning hips at EOB. Patient sitting in soiled bed linens and pads Transfers Overall transfer level: Needs assistance Equipment used: 1 person hand held assist Transfers: Squat Pivot Transfers,Sit to/from Stand Sit to Stand: Mod assist Stand pivot transfers: Max assist,+2 physical assistance Squat pivot transfers: Max assist General transfer comment: modA for sit to stand x 2 for boost up and steady. Patient unable to  advance R foot due to decreased weight shift to L. MaxA for squat pivot to recliner to the R. +2 would be beneficial for safety Ambulation/Gait Ambulation/Gait assistance: Max assist,+2 physical assistance Gait Distance (Feet): 3 Feet Assistive device: 2 person hand held assist Gait Pattern/deviations: Step-to pattern,Decreased stride length,Decreased weight shift to left,Ataxic General Gait Details: pt with difficulty coordination of placement of LLE, increased assist to steady due to strong L lean. increased verbal cues for directioning and movement of LLE Gait velocity: decreaseed Gait velocity interpretation: <1.31 ft/sec, indicative of household ambulator    ADL: ADL Overall ADL's : Needs assistance/impaired Eating/Feeding: Supervision/ safety,Sitting Grooming: Wash/dry hands,Moderate assistance,Sitting Grooming Details (indicate cue type and reason): with LUE Upper Body Bathing: Moderate assistance,Sitting Lower Body Bathing: Maximal assistance,Sitting/lateral leans,Sit to/from stand,Bed level Upper Body Dressing : Moderate assistance,Sitting Lower Body Dressing: Maximal assistance,Sitting/lateral leans,Sit to/from stand,Cueing for safety Lower Body Dressing Details (indicate cue type and reason): assist to keep BLEs with figure 4 technique Toilet Transfer: Maximal assistance,+2 for physical assistance,+2 for safety/equipment,Stand-pivot,Cueing for safety,Cueing for sequencing Toilet Transfer Details (indicate cue type and reason): simulated to recliner Toileting- Clothing Manipulation and Hygiene: Maximal assistance,Sitting/lateral lean,Sit to/from stand,+2 for physical assistance,+2 for safety/equipment Functional mobility during ADLs: Maximal assistance,+2 for physical assistance,+2 for safety/equipment,Cueing for sequencing,Cueing for safety General ADL Comments: pt limited by decreased strength, decreased ability to care for self and decreased coordination on L  side.  Cognition: Cognition Overall Cognitive Status: Impaired/Different from baseline Arousal/Alertness: Awake/alert Orientation Level: Oriented X4 Memory: Impaired (3/5 items recalled without cues, 2 with cues) Memory Impairment: Retrieval deficit Awareness: Appears intact Problem Solving: Appears intact Safety/Judgment: Appears intact Comments: Pt demonstrates good problem solving skills as she asked SLP for assist with meal orders and offered for SLP to write order to help SLP recall Cognition Arousal/Alertness: Awake/alert Behavior During Therapy: WFL for tasks assessed/performed,Impulsive Overall Cognitive Status: Impaired/Different from baseline Area of Impairment: Attention,Following commands,Safety/judgement,Awareness,Problem solving Current Attention Level: Sustained Memory: Decreased short-term memory Following Commands: Follows one step commands consistently Safety/Judgement: Decreased awareness of safety,Decreased awareness of deficits Awareness: Emergent Problem Solving: Difficulty sequencing,Requires verbal cues,Requires tactile cues General Comments: patient impulsive this session and demos decreased awareness of deficits with transfers. Cues for attending to task as patient is easily distracted  Physical Exam: Blood pressure (!) 158/75, pulse 73, temperature 98.4 F (36.9 C), temperature source Oral, resp. rate 18, height 5\' 8"  (1.727 m), weight 99 kg, SpO2 91 %. Physical Exam Constitutional:      General: She is not in acute distress.    Appearance: She is obese.  HENT:     Head: Normocephalic and atraumatic.     Nose: Nose normal.  Mouth/Throat:     Mouth: Mucous membranes are moist.  Eyes:     Extraocular Movements: Extraocular movements intact.     Pupils: Pupils are equal, round, and reactive to light.  Cardiovascular:     Rate and Rhythm: Normal rate and regular rhythm.     Heart sounds: No murmur heard. No gallop.   Pulmonary:     Effort:  Pulmonary effort is normal. No respiratory distress.     Breath sounds: No wheezing.     Comments: Intermittent cough Abdominal:     General: Bowel sounds are normal. There is no distension.     Palpations: Abdomen is soft.  Neurological:     Mental Status: She is alert.     Comments: Patient is alert.  She is a bit dysarthric.  Provides name age.  Follows commands.  Fair insight and awareness. Left central 7 without significant tongue deviation.  LUE 3-/5 prox to distal. LLE 3+/5 prox to 3+ to 4/5 distal. RUE and RLE 4/5 at least. No sensory changes. Toes down. No resting tone.   Psychiatric:        Mood and Affect: Mood normal.        Behavior: Behavior normal.     Results for orders placed or performed during the hospital encounter of 04/19/21 (from the past 48 hour(s))  Glucose, capillary     Status: Abnormal   Collection Time: 04/26/21 11:13 AM  Result Value Ref Range   Glucose-Capillary 204 (H) 70 - 99 mg/dL    Comment: Glucose reference range applies only to samples taken after fasting for at least 8 hours.  Glucose, capillary     Status: Abnormal   Collection Time: 04/26/21  4:05 PM  Result Value Ref Range   Glucose-Capillary 245 (H) 70 - 99 mg/dL    Comment: Glucose reference range applies only to samples taken after fasting for at least 8 hours.  Glucose, capillary     Status: Abnormal   Collection Time: 04/26/21  9:17 PM  Result Value Ref Range   Glucose-Capillary 160 (H) 70 - 99 mg/dL    Comment: Glucose reference range applies only to samples taken after fasting for at least 8 hours.  CBC     Status: None   Collection Time: 04/27/21  4:11 AM  Result Value Ref Range   WBC 9.7 4.0 - 10.5 K/uL   RBC 4.31 3.87 - 5.11 MIL/uL   Hemoglobin 12.4 12.0 - 15.0 g/dL   HCT 37.1 36.0 - 46.0 %   MCV 86.1 80.0 - 100.0 fL   MCH 28.8 26.0 - 34.0 pg   MCHC 33.4 30.0 - 36.0 g/dL   RDW 13.2 11.5 - 15.5 %   Platelets 263 150 - 400 K/uL   nRBC 0.0 0.0 - 0.2 %    Comment: Performed  at [facility], [place] [address] [place], [place] [address]  Basic metabolic panel     Status: Abnormal   Collection Time: 04/27/21  4:11 AM  Result Value Ref Range   Sodium 135 135 - 145 mmol/L   Potassium 3.9 3.5 - 5.1 mmol/L   Chloride 99 98 - 111 mmol/L   CO2 26 22 - 32 mmol/L   Glucose, Bld 189 (H) 70 - 99 mg/dL    Comment: Glucose reference range applies only to samples taken after fasting for at least 8 hours.   BUN 26 (H) 8 - 23 mg/dL   Creatinine, Ser 1.02 (H) 0.44 - 1.00  mg/dL   Calcium 8.6 (L) 8.9 - 10.3 mg/dL   GFR, Estimated >60 >60 mL/min    Comment: (NOTE) Calculated using the CKD-EPI Creatinine Equation (2021)    Anion gap 10 5 - 15    Comment: Performed at [facility], [place] [address] [place], [place] [address]  Glucose, capillary     Status: Abnormal   Collection Time: 04/27/21  6:19 AM  Result Value Ref Range   Glucose-Capillary 190 (H) 70 - 99 mg/dL    Comment: Glucose reference range applies only to samples taken after fasting for at least 8 hours.  Glucose, capillary     Status: Abnormal   Collection Time: 04/27/21 12:36 PM  Result Value Ref Range   Glucose-Capillary 170 (H) 70 - 99 mg/dL    Comment: Glucose reference range applies only to samples taken after fasting for at least 8 hours.  Glucose, capillary     Status: Abnormal   Collection Time: 04/27/21  4:59 PM  Result Value Ref Range   Glucose-Capillary 156 (H) 70 - 99 mg/dL    Comment: Glucose reference range applies only to samples taken after fasting for at least 8 hours.  Glucose, capillary     Status: None   Collection Time: 04/27/21  9:18 PM  Result Value Ref Range   Glucose-Capillary 88 70 - 99 mg/dL    Comment: Glucose reference range applies only to samples taken after fasting for at least 8 hours.  CBC     Status: Abnormal   Collection Time: 04/28/21  4:17 AM  Result Value Ref Range   WBC 10.8 (H) 4.0 - 10.5 K/uL   RBC 4.50 3.87 - 5.11 MIL/uL   Hemoglobin 12.8 12.0 - 15.0  g/dL   HCT 39.3 36.0 - 46.0 %   MCV 87.3 80.0 - 100.0 fL   MCH 28.4 26.0 - 34.0 pg   MCHC 32.6 30.0 - 36.0 g/dL   RDW 13.3 11.5 - 15.5 %   Platelets 278 150 - 400 K/uL   nRBC 0.0 0.0 - 0.2 %    Comment: Performed at [facility], [place] [address] [place], [place] [id number]  Basic metabolic panel     Status: Abnormal   Collection Time: 04/28/21  4:17 AM  Result Value Ref Range   Sodium 137 135 - 145 mmol/L   Potassium 4.1 3.5 - 5.1 mmol/L   Chloride 101 98 - 111 mmol/L   CO2 27 22 - 32 mmol/L   Glucose, Bld 124 (H) 70 - 99 mg/dL    Comment: Glucose reference range applies only to samples taken after fasting for at least 8 hours.   BUN 16 8 - 23 mg/dL   Creatinine, Ser 0.94 0.44 - 1.00 mg/dL   Calcium 9.0 8.9 - 10.3 mg/dL   GFR, Estimated >60 >60 mL/min    Comment: (NOTE) Calculated using the CKD-EPI Creatinine Equation (2021)    Anion gap 9 5 - 15    Comment: Performed at [place] [address], [place], [place] [address]  Glucose, capillary     Status: Abnormal   Collection Time: 04/28/21  6:39 AM  Result Value Ref Range   Glucose-Capillary 123 (H) 70 - 99 mg/dL    Comment: Glucose reference range applies only to samples taken after fasting for at least 8 hours.   No results found.     Medical Problem List and Plan: 1.  Left side weakness secondary to right thalamic  infarction.  Status post mechanical thrombectomy of right PCA P2 occlusion 04/19/2021.  Status post loop recorder  -patient may shower  -ELOS/Goals: 18-21 days, supervision with PT, OT, SLP 2.  Antithrombotics: -DVT/anticoagulation: SCDs  -antiplatelet therapy: Aspirin 81 mg daily, Plavix 75 mg daily x3 weeks then aspirin alone 3. Pain Management: Tylenol as needed 4. Mood: Zoloft 100 mg daily, melatonin as needed.  Provide emotional support  -antipsychotic agents: N/A 5. Neuropsych: This patient is capable of making decisions on her own behalf. 6. Skin/Wound Care: Routine skin checks 7.  Fluids/Electrolytes/Nutrition: Routine in and outs with follow-up chemistries 8.  Diabetes mellitus.  Hemoglobin A1c 9.3.  Currently on NovoLog 5 units 3 times daily with meals, Levemir 12 units twice daily.  Patient on Actos, Januvia 100 mg, Glucotrol XL 5 mg daily, metformin 1000 mg twice daily prior to admission.    -cbg's currently elevated.   -Check blood sugars before meals and at bedtime.    -resume glucophage at 500mg  bid  -Diabetic teaching 9.  Permissive hypertension.  Cozaar 25 mg twice daily 10.  Hyperlipidemia.  Lipitor 11.  Obesity.  BMI 33.19.  Dietary follow-up 12.  GERD.  Protonix 13. Constipation: scheduled senna-s, encourage liquids, fruits, vegetables  -dulcolax suppository/fleet enema prn 14. Cough: COVID negative  -appears to be mostly pharyngeal, upper airway  -will begin mucinex DM  Cathlyn Parsons, PA-C 04/28/2021

## 2021-04-22 NOTE — Progress Notes (Signed)
Inpatient Diabetes Program Recommendations  AACE/ADA: New Consensus Statement on Inpatient Glycemic Control (2015)  Target Ranges:  Prepandial:   less than 140 mg/dL      Peak postprandial:   less than 180 mg/dL (1-2 hours)      Critically ill patients:  140 - 180 mg/dL   Lab Results  Component Value Date   GLUCAP 272 (H) 04/22/2021   HGBA1C 9.3 (H) 04/19/2021    Review of Glycemic Control Results for [name], [name] (MRN [id number]) as of 04/22/2021 13:18  Ref. Range 04/21/2021 11:11 04/21/2021 16:10 04/21/2021 21:17 04/22/2021 06:08 04/22/2021 12:25  Glucose-Capillary Latest Ref Range: 70 - 99 mg/dL 262 (H) 240 (H) 237 (H) 253 (H) 272 (H)   Diabetes history: DM2 Outpatient Diabetes medications: Actos 45 + Januvia 100 + Glucotrol 5 mg + Metformin 1 gm bid Current orders for Inpatient glycemic control: Levemir 10 units bid + Novolog 3 units tid meal coverage + Novolog 0-15 units tid correction   Inpatient Diabetes Program Recommendations:   -Increase Levemir to 12 units bid -Increase Novolog meal coverage to 5 units tid if eats 50%  Thank you, Bethena Roys E. Michaeljames Milnes, RN, MSN, CDE  Diabetes Coordinator Inpatient Glycemic Control Team Team Pager [phone] (8am-5pm) 04/22/2021 1:19 PM

## 2021-04-22 NOTE — Progress Notes (Signed)
Physical Therapy Treatment Patient Details Name: [name] MRN: [id number] DOB: October 18, 1956 Today's Date: 04/22/2021    History of Present Illness [name] is a 65 y.o. female presenting 5/6 due to L-sided weakness and numbness, who has a thalamic stroke on the right along with the MRA showing a possible right P1 occlusion. Pt now s/p thrombectomy on 5/7 with complete recanalization. PMHx; of poorly controlled diabetes and hypertension, hyperlipidemia, obesity, anxiety, depression.    PT Comments    Pt motivated to progress mobility and get out of bed. Worked on sitting balance and WB'ing LUE as well as increasing attention to that side with functional activities in sitting. Pt with posterior LOB in sitting when distracted. Pt transferred bed to recliner with squat pivot to R to simulate w/c transfer, mod A +2 needed for support and safety. Continue to rec CIR at d/c. PT will continue to follow.    Follow Up Recommendations  CIR     Equipment Recommendations  None recommended by PT    Recommendations for Other Services Rehab consult     Precautions / Restrictions Precautions Precautions: Fall Restrictions Weight Bearing Restrictions: No    Mobility  Bed Mobility Overal bed mobility: Needs Assistance Bed Mobility: Supine to Sit     Supine to sit: Mod assist     General bed mobility comments: going to R side of bed pt needed mod A for sequencing and elevation of trunk to full upright sitting. Pt had difficulty coordinating mvmt. vc's needed to scoot R hip back once sitting as pt sitting too close to EOB and unaware    Transfers Overall transfer level: Needs assistance Equipment used: None Transfers: Squat Pivot Transfers     Squat pivot transfers: Mod assist;+2 safety/equipment     General transfer comment: squat pivot transfer to R to simulate w/c transfer, mod A to stand with +2 on left side for safety  Ambulation/Gait                  Stairs             Wheelchair Mobility    Modified Rankin (Stroke Patients Only) Modified Rankin (Stroke Patients Only) Pre-Morbid Rankin Score: No symptoms Modified Rankin: Severe disability     Balance Overall balance assessment: Needs assistance Sitting-balance support: No upper extremity supported;Single extremity supported;Feet supported Sitting balance-Leahy Scale: Fair Sitting balance - Comments: posterior LOB 2x in sitting when distracted and performed LE motion, vc's to correct. Postural control: Posterior lean;Left lateral lean Standing balance support: Bilateral upper extremity supported Standing balance-Leahy Scale: Poor Standing balance comment: mod A +2 to stand                            Cognition Arousal/Alertness: Awake/alert Behavior During Therapy: WFL for tasks assessed/performed;Impulsive Overall Cognitive Status: Impaired/Different from baseline Area of Impairment: Attention                   Current Attention Level: Sustained Memory: Decreased short-term memory Following Commands: Follows one step commands consistently Safety/Judgement: Decreased awareness of safety;Decreased awareness of deficits Awareness: Intellectual Problem Solving: Difficulty sequencing;Requires verbal cues;Requires tactile cues General Comments: needs frequent cues to care for LUE. Can verbalize that she needs to do that but then does not do functionally. Impulsive mvmts and lack of safety awareness noted with transfers      Exercises      General Comments General comments (skin integrity, edema,  etc.): VSS. Pt able to verbalize with cues that pillow needed to be placed under L UE.      Pertinent Vitals/Pain Pain Assessment: No/denies pain Faces Pain Scale: No hurt    Home Living       Type of Home: House              Prior Function            PT Goals (current goals can now be found in the care plan section) Acute Rehab PT  Goals Patient Stated Goal: return to independence PT Goal Formulation: With patient Time For Goal Achievement: 05/03/21 Potential to Achieve Goals: Good Progress towards PT goals: Progressing toward goals    Frequency    Min 4X/week      PT Plan Current plan remains appropriate    Co-evaluation              AM-PAC PT "6 Clicks" Mobility   Outcome Measure  Help needed turning from your back to your side while in a flat bed without using bedrails?: A Little Help needed moving from lying on your back to sitting on the side of a flat bed without using bedrails?: A Lot Help needed moving to and from a bed to a chair (including a wheelchair)?: A Lot Help needed standing up from a chair using your arms (e.g., wheelchair or bedside chair)?: A Lot Help needed to walk in hospital room?: Total Help needed climbing 3-5 steps with a railing? : Total 6 Click Score: 11    End of Session Equipment Utilized During Treatment: Gait belt Activity Tolerance: Patient tolerated treatment well Patient left: with call bell/phone within reach;in chair;with chair alarm set Nurse Communication: Mobility status;Need for lift equipment (stedy) PT Visit Diagnosis: Other abnormalities of gait and mobility (R26.89);Ataxic gait (R26.0)     Time: 2951-8841 PT Time Calculation (min) (ACUTE ONLY): 19 min  Charges:  $Therapeutic Activity: 8-22 mins                     Leighton Roach, PT  Acute Rehab Services  Pager [phone] Office [place] 04/22/2021, 1:27 PM

## 2021-04-22 NOTE — Progress Notes (Signed)
Inpatient Rehab Admissions Coordinator:   Awaiting determination from insurance regarding authorization for CIR.  Will continue to follow.   Shann Medal, PT, DPT Admissions Coordinator [phone] 04/22/21  12:26 PM

## 2021-04-22 NOTE — Plan of Care (Signed)
  Problem: Education: Goal: Knowledge of disease or condition will improve Outcome: Progressing Goal: Knowledge of secondary prevention will improve Outcome: Progressing Goal: Knowledge of patient specific risk factors addressed and post discharge goals established will improve Outcome: Progressing Goal: Individualized Educational Video(s) Outcome: Progressing   Problem: Education: Goal: Knowledge of disease or condition will improve Outcome: Progressing Goal: Knowledge of secondary prevention will improve Outcome: Progressing Goal: Knowledge of patient specific risk factors addressed and post discharge goals established will improve Outcome: Progressing Goal: Individualized Educational Video(s) Outcome: Progressing   Problem: Coping: Goal: Will verbalize positive feelings about self Outcome: Progressing Goal: Will identify appropriate support needs Outcome: Progressing   Problem: Health Behavior/Discharge Planning: Goal: Ability to manage health-related needs will improve Outcome: Progressing   Problem: Self-Care: Goal: Ability to participate in self-care as condition permits will improve Outcome: Progressing Goal: Verbalization of feelings and concerns over difficulty with self-care will improve Outcome: Progressing Goal: Ability to communicate needs accurately will improve Outcome: Progressing   Problem: Nutrition: Goal: Risk of aspiration will decrease Outcome: Progressing Goal: Dietary intake will improve Outcome: Progressing   Problem: Ischemic Stroke/TIA Tissue Perfusion: Goal: Complications of ischemic stroke/TIA will be minimized Outcome: Progressing

## 2021-04-23 LAB — GLUCOSE, CAPILLARY
Glucose-Capillary: 228 mg/dL — ABNORMAL HIGH (ref 70–99)
Glucose-Capillary: 316 mg/dL — ABNORMAL HIGH (ref 70–99)
Glucose-Capillary: 215 mg/dL — ABNORMAL HIGH (ref 70–99)
Glucose-Capillary: 296 mg/dL — ABNORMAL HIGH (ref 70–99)

## 2021-04-23 MED ORDER — GABAPENTIN 300 MG PO CAPS
300.0000 mg | ORAL_CAPSULE | Freq: Once | ORAL | Status: AC
  Administered 2021-04-23: 300 mg via ORAL
  Filled 2021-04-23: qty 1

## 2021-04-23 MED ORDER — SODIUM CHLORIDE 0.9 % IV SOLN: INTRAVENOUS | Status: DC

## 2021-04-23 NOTE — Progress Notes (Signed)
Inpatient Diabetes Program Recommendations  AACE/ADA: New Consensus Statement on Inpatient Glycemic Control (2015)  Target Ranges:  Prepandial:   less than 140 mg/dL      Peak postprandial:   less than 180 mg/dL (1-2 hours)      Critically ill patients:  140 - 180 mg/dL   Lab Results  Component Value Date   GLUCAP 316 (H) 04/23/2021   HGBA1C 9.3 (H) 04/19/2021    Review of Glycemic Control  Diabetes history:DM2 Outpatient Diabetes medications:Actos 45 + Januvia 100 + Glucotrol 5 mg + Metformin 1 gm bid Current orders for Inpatient glycemic control: Levemir 12 units bid + Novolog 5 units tid meal coverage +Novolog 0-15 units tid correction  Inpatient Diabetes Program Recommendations:   -Increase Levemir to 15 units bid -Increase Novolog meal coverage to 8 units tid if eats 50%  Thank you, Bethena Roys E. Caspian Deleonardis, RN, MSN, CDE  Diabetes Coordinator Inpatient Glycemic Control Team Team Pager [phone] (8am-5pm) 04/23/2021 1:28 PM

## 2021-04-23 NOTE — Progress Notes (Signed)
    [facility] has been requested to perform a transesophageal echocardiogram on 04/24/2021 for CVA.  After careful review of history and examination, the risks and benefits of transesophageal echocardiogram have been explained including risks of esophageal damage, perforation (1:10,000 risk), bleeding, pharyngeal hematoma as well as other potential complications associated with conscious sedation including aspiration, arrhythmia, respiratory failure and death. Alternatives to treatment were discussed, questions were answered. Patient is willing to proceed.   65 yo female presented with acute CVA. TTE was normal. Neurology recommended TEE +/- loop recorder. Vital sign stable. No anemia or thrombocytopenia.   Almyra Deforest, [place] 04/23/2021 3:51 PM

## 2021-04-23 NOTE — Progress Notes (Signed)
Inpatient Rehab Admissions Coordinator:   Awaiting determination from insurance regarding authorization for CIR.  Will continue to follow.   Shann Medal, PT, DPT Admissions Coordinator [phone] 04/23/21  11:05 AM

## 2021-04-23 NOTE — Progress Notes (Signed)
Physical Therapy Treatment Patient Details Name: [name] MRN: [id number] DOB: 1956/07/29 Today's Date: 04/23/2021    History of Present Illness [name] is a 65 y.o. female presenting 5/6 due to L-sided weakness and numbness, who has a thalamic stroke on the right along with the MRA showing a possible right P1 occlusion. Pt now s/p thrombectomy on 5/7 with complete recanalization. PMHx; of poorly controlled diabetes and hypertension, hyperlipidemia, obesity, anxiety, depression.    PT Comments    Pt up in recliner upon PT arrival, requests return to bed. Pt tolerating repeated sit to stands well today, with standing tolerance ~30 seconds each attempt. Pt continues to demonstrate impaired coordination and weakness of LLE, and is fearful of loss of balance due to this in standing even with PT guarding. PT guided pt in exercises with focus on motor grading and controlling stop/start of motion. PT continuing to recommend CIR post-acutely, remains motivated to return to work and independence.    Follow Up Recommendations  CIR     Equipment Recommendations  None recommended by PT    Recommendations for Other Services Rehab consult     Precautions / Restrictions Precautions Precautions: Fall Restrictions Weight Bearing Restrictions: No    Mobility  Bed Mobility Overal bed mobility: Needs Assistance Bed Mobility: Sit to Supine       Sit to supine: Mod assist   General bed mobility comments: up in chair upon PT arrival to room. mod assist for trunk lowering and LE placement in bed, LLE lagging behind R.    Transfers Overall transfer level: Needs assistance   Transfers: Squat Pivot Transfers;Sit to/from Stand Sit to Stand: Mod assist   Squat pivot transfers: Mod assist     General transfer comment: Mod assist for repeated STS x3 for power up, rise, steadying once standing, placement of LLE on ground, and LLE blocking. Pt with swaying in standing, standing  tolerance x30 seconds at a time. Squat pivot to bed towards L with mod assist for truncal translation, LLE translation.  Ambulation/Gait                 Stairs             Wheelchair Mobility    Modified Rankin (Stroke Patients Only) Modified Rankin (Stroke Patients Only) Pre-Morbid Rankin Score: No symptoms Modified Rankin: Severe disability     Balance Overall balance assessment: Needs assistance Sitting-balance support: Feet supported;Single extremity supported Sitting balance-Leahy Scale: Fair   Postural control: Left lateral lean   Standing balance-Leahy Scale: Poor                              Cognition Arousal/Alertness: Awake/alert Behavior During Therapy: WFL for tasks assessed/performed;Impulsive Overall Cognitive Status: Impaired/Different from baseline Area of Impairment: Attention;Following commands;Safety/judgement;Awareness;Problem solving                   Current Attention Level: Sustained   Following Commands: Follows one step commands consistently Safety/Judgement: Decreased awareness of safety;Decreased awareness of deficits Awareness: Emergent Problem Solving: Difficulty sequencing;Requires verbal cues;Requires tactile cues General Comments: Pt moves quickly and impulsively, requires cues to wait for PT assist. Pt demonstarting improved insight into deficits, expressing she was scared to step forward with RLE due to LLE weakness. Intermittent attentional cues throughout mobility.      Exercises General Exercises - Lower Extremity Hip ABduction/ADduction: AAROM;Left;10 reps;Supine (tapping PT hand as target with lower leg, cues for  graded response and controlled stop once reaches PT hand) Straight Leg Raises: AAROM;Left;10 reps;Supine    General Comments        Pertinent Vitals/Pain Pain Assessment: No/denies pain Faces Pain Scale: No hurt Pain Intervention(s): Monitored during session    Home Living                       Prior Function            PT Goals (current goals can now be found in the care plan section) Acute Rehab PT Goals Patient Stated Goal: return to independence PT Goal Formulation: With patient Time For Goal Achievement: 05/03/21 Potential to Achieve Goals: Good Progress towards PT goals: Progressing toward goals    Frequency    Min 4X/week      PT Plan Current plan remains appropriate    Co-evaluation              AM-PAC PT "6 Clicks" Mobility   Outcome Measure  Help needed turning from your back to your side while in a flat bed without using bedrails?: A Little Help needed moving from lying on your back to sitting on the side of a flat bed without using bedrails?: A Lot Help needed moving to and from a bed to a chair (including a wheelchair)?: A Lot Help needed standing up from a chair using your arms (e.g., wheelchair or bedside chair)?: A Lot Help needed to walk in hospital room?: A Lot Help needed climbing 3-5 steps with a railing? : Total 6 Click Score: 12    End of Session Equipment Utilized During Treatment: Gait belt Activity Tolerance: Patient tolerated treatment well Patient left: with call bell/phone within reach;in bed;with bed alarm set Nurse Communication: Mobility status PT Visit Diagnosis: Other abnormalities of gait and mobility (R26.89);Ataxic gait (R26.0)     Time: 1610-9604 PT Time Calculation (min) (ACUTE ONLY): 20 min  Charges:  $Therapeutic Activity: 8-22 mins                     Stacie Glaze, PT DPT Acute Rehabilitation Services Pager [phone]  Office [phone]    [name] 04/23/2021, 5:03 PM

## 2021-04-23 NOTE — Progress Notes (Signed)
Occupational Therapy Treatment Patient Details Name: [name] MRN: [id number] DOB: May 19, 1956 Today's Date: 04/23/2021    History of present illness [name] is a 65 y.o. female presenting 5/6 due to L-sided weakness and numbness, who has a thalamic stroke on the right along with the MRA showing a possible right P1 occlusion. Pt now s/p thrombectomy on 5/7 with complete recanalization. PMHx; of poorly controlled diabetes and hypertension, hyperlipidemia, obesity, anxiety, depression.   OT comments  Patient making good progress toward patient focused OT goals.  She demonstrates improved edge of bed balance and midline sitting.  Able to practice weight shifting with OT placing and holding L hand on solid surface.  Patient demonstrates less left lateral lean, and able to push through L UE with side to side weight shifting.  Practiced forward leaning and pushing off to attempt lifting bottom off solid surface for improved independence with toileting and transfers.  Mod A for squat pivot to recliner.  Patient setup for eating lunch.  Barriers are listed below, OT to continue to see the patient in the acute setting to maximize functional status.  CIR is recommended for post acute.  Given her prior level of function, and motivation, she should do well.    Follow Up Recommendations  CIR    Equipment Recommendations  3 in 1 bedside commode;Wheelchair (measurements OT);Wheelchair cushion (measurements OT)    Recommendations for Other Services      Precautions / Restrictions Precautions Precautions: Fall Restrictions Weight Bearing Restrictions: No       Mobility Bed Mobility Overal bed mobility: Needs Assistance Bed Mobility: Supine to Sit     Supine to sit: Mod assist       Patient Response: Cooperative  Transfers Overall transfer level: Needs assistance   Transfers: Squat Pivot Transfers Sit to Stand: Mod assist         General transfer comment: Mod A to R side.   Patient able to reach for arm rest and utilize R LE for squat pivot.    Balance Overall balance assessment: Needs assistance Sitting-balance support: Feet supported;Single extremity supported Sitting balance-Leahy Scale: Fair   Postural control: Left lateral lean   Standing balance-Leahy Scale: Poor                             ADL either performed or assessed with clinical judgement   ADL   Eating/Feeding: Supervision/ safety;Sitting   Grooming: Wash/dry hands;Moderate assistance;Sitting                                       Vision Patient Visual Report: No change from baseline                Cognition                               Following Commands: Follows one step commands consistently     Problem Solving: Requires verbal cues          Exercises     Shoulder Instructions       General Comments      Pertinent Vitals/ Pain       Faces Pain Scale: No hurt Pain Intervention(s): Monitored during session  Frequency  Min 2X/week        Progress Toward Goals  OT Goals(current goals can now be found in the care plan section)  Progress towards OT goals: Progressing toward goals  Acute Rehab OT Goals Patient Stated Goal: do more for myself OT Goal Formulation: With patient Time For Goal Achievement: 05/03/21 Potential to Achieve Goals: Good  Plan      Co-evaluation                 AM-PAC OT "6 Clicks" Daily Activity     Outcome Measure   Help from another person eating meals?: A Little Help from another person taking care of personal grooming?: A Lot Help from another person toileting, which includes using toliet, bedpan, or urinal?: A Lot Help from another person bathing (including washing, rinsing, drying)?: A Lot Help from another person to put on and taking off regular upper body clothing?: A Lot Help from  another person to put on and taking off regular lower body clothing?: A Lot 6 Click Score: 13    End of Session Equipment Utilized During Treatment: Gait belt  OT Visit Diagnosis: Unsteadiness on feet (R26.81);Muscle weakness (generalized) (M62.81)   Activity Tolerance Patient tolerated treatment well   Patient Left in chair;with call bell/phone within reach;with family/visitor present   Nurse Communication          Time: 1131-1150 OT Time Calculation (min): 19 min  Charges: OT General Charges $OT Visit: 1 Visit OT Treatments $Self Care/Home Management : 8-22 mins  04/23/2021  Rich, OTR/L  Acute Rehabilitation Services  Office:  [place] 04/23/2021, 11:56 AM

## 2021-04-23 NOTE — Progress Notes (Signed)
STROKE TEAM PROGRESS NOTE    INTERVAL HISTORY No acute events No new complaints or concerns tod. Neuro exam is unchanged.  Vital signs stable. Yet await insurance approval for transfer to inpatient rehab OBJECTIVE Vitals:   04/22/21 2310 04/23/21 0323 04/23/21 0721 04/23/21 1109  BP: (!) 147/64 (!) 153/74 (!) 174/75 (!) 149/76  Pulse: 75 77 81 73  Resp: 17 17 17 17   Temp: 98.2 F (36.8 C) 98 F (36.7 C) 98 F (36.7 C) 98.1 F (36.7 C)  TempSrc: Oral Oral Oral Oral  SpO2: 100% 98% 96% 98%  Weight:      Height:        CBC:  Recent Labs  Lab 04/18/21 1821  WBC 9.0  NEUTROABS 5.7  HGB 12.9  HCT 38.0  MCV 84.1  PLT 295    Basic Metabolic Panel:  Recent Labs  Lab 04/20/21 0943 04/21/21 0834  NA 132* 136  K 3.9 4.0  CL 99 101  CO2 24 27  GLUCOSE 256* 197*  BUN 10 13  CREATININE 0.89 0.88  CALCIUM 8.8* 9.2    Lipid Panel:     Component Value Date/Time   CHOL 195 04/19/2021 0427   CHOL 233 (H) 09/20/2020 1132   CHOL 205 (H) 09/06/2013 1518   TRIG 221 (H) 04/19/2021 0427   TRIG 174 09/06/2013 1518   HDL 46 04/19/2021 0427   HDL 47 09/20/2020 1132   HDL 53 09/06/2013 1518   CHOLHDL 4.2 04/19/2021 0427   VLDL 44 (H) 04/19/2021 0427   VLDL 35 09/06/2013 1518   LDLCALC 105 (H) 04/19/2021 0427   LDLCALC 152 (H) 09/20/2020 1132   LDLCALC 117 (H) 09/06/2013 1518   HgbA1c:  Lab Results  Component Value Date   HGBA1C 9.3 (H) 04/19/2021   Urine Drug Screen: No results found for: LABOPIA, COCAINSCRNUR, LABBENZ, AMPHETMU, THCU, LABBARB  Alcohol Level No results found for: ETH  IMAGING  MR BRAIN WO CONTRAST MR ANGIO HEAD WO CONTRAST 04/18/2021 IMPRESSION:  1. Small acute infarct of the anterolateral right thalamus adjacent to the posterior limb of the right internal capsule.  2. Right PCA mid P1 segment occlusion. Loss of normal flow related enhancement in the right vertebral artery V4 segment.  MR BRAIN WO CONTRAST 04/19/2021 IMPRESSION:  1. Stable acute  nonhemorrhagic infarct involving the lateral aspect of the right thalamus.  2. Additional acute nonhemorrhagic infarcts involving the more superior and medial right thalamus, the right occipital lobe, the left side of the splenium of the corpus callosum, and the right cerebellum.  3. Stable white matter disease. This likely reflects the sequela of chronic microvascular ischemia.    ECHOCARDIOGRAM COMPLETE 04/19/2021 IMPRESSIONS   1. Left ventricular ejection fraction, by estimation, is 70 to 75%. The left ventricle has hyperdynamic function. The left ventricle has no regional wall motion abnormalities. Left ventricular diastolic parameters are consistent with Grade I diastolic dysfunction (impaired relaxation). Elevated left atrial pressure.   2. Right ventricular systolic function is normal. The right ventricular size is normal. Tricuspid regurgitation signal is inadequate for assessing PA pressure.   3. Left atrial size was mildly dilated.   4. The mitral valve is normal in structure. Mild mitral valve regurgitation. No evidence of mitral stenosis.   5. The aortic valve is normal in structure. Aortic valve regurgitation is not visualized. No aortic stenosis is present.   6. The inferior vena cava is normal in size with greater than 50% respiratory variability, suggesting right atrial pressure of  3 mmHg.   CT HEAD CODE STROKE WO CONTRAST 04/18/2021 IMPRESSION:  1. Normal CT appearance of the brain.  2. ASPECTS is 10/10.   CT ANGIO HEAD NECK W WO CM W PERF (CODE STROKE) 04/18/2021 IMPRESSION:  1. Occlusion of the right posterior cerebral artery P1 segment with 13 mL area of ischemic penumbra in the right occipital lobe.  2. 50 % stenosis of the proximal right internal carotid artery secondary to mixed density atherosclerosis.  Aortic Atherosclerosis (ICD10-I70.0).   ECG - SR rate 82 BPM. Possible old MI. (See cardiology reading for complete details)  PHYSICAL EXAM Blood pressure (!) 149/76,  pulse 73, temperature 98.1 F (36.7 C), temperature source Oral, resp. rate 17, height 5\' 8"  (1.727 m), weight 99 kg, SpO2 98 %.  GENERAL: Pleasant middle-aged Caucasian lady she is awake and alert and responsive.  HEENT: This is normal.  ABDOMEN: soft  EXTREMITIES: No edema   BACK: Normal  SKIN: Normal by inspection.    MENTAL STATUS: Alert and oriented. Speech, language and cognition are generally intact. Judgment and insight normal.   CRANIAL NERVES: Pupils are equal, round and reactive to light and accomodation; extra ocular movements are full, there is no significant nystagmus; visual fields are full; there is flattening of the nasolabial fold on left side; tongue is midline  MOTOR: Direct strength is 5/5 but there is left hemiparesis with left upper extremity 2/5 and left lower extremity 3/5 strength with drift on both..  Right side is normal.  COORDINATION: There is significant dysmetria of the left upper extremity and left lower extremity.  SENSATION: There is reduced sensation to temperature light touch along the left upper extremity and left leg.  ASSESSMENT/PLAN [name] is a 65 y.o. female with history of poorly controlled diabetes and hypertension, hyperlipidemia, obesity, anxiety, depression, presenting to [facility] with acute dizziness left-sided weakness, left-sided numbness, and possible visual deficits. She received IV tPA  via telemedicine at [facility] [facility] 04/18/2021 and was found to have occlusion of the right P2/PCA and was taken to IR for thrombectomy. MT performed with combined stentriever and aspiration. Total of 1 pass with complete recanalization (TICI3). No hemorrhagic complication on flat panel head CT. [name].  Stroke: acute nonhemorrhagic lateral aspect of the right thalamus. Acute nonhemorrhagic infarcts involving the more superior and medial right thalamus, the right occipital lobe, the left side of the  splenium of the corpus callosum, and the right cerebellum - embolic - etiology unknown.  Resultant dysmetria involving the left side and left-sided numbness.  Code Stroke CT Head - Normal CT appearance of the brain. ASPECTS is 10/10.   CT head - not ordered  MRI head - 04/18/21 - Small acute infarct of the anterolateral right thalamus adjacent to the posterior limb of the right internal capsule.   MRI head - 04/19/21 - Stable acute nonhemorrhagic infarct involving the lateral aspect of the right thalamus. Additional acute nonhemorrhagic infarcts involving the more superior and medial right thalamus, the right occipital lobe, the left side of the splenium of the corpus callosum, and the right cerebellum. Stable white matter disease. This likely reflects the sequela of chronic microvascular ischemia.    MRA head - Right PCA mid P1 segment occlusion. Loss of normal flow related enhancement in the right vertebral artery V4 segment.  CTA H&N - 50 % stenosis of the proximal right internal carotid artery secondary to mixed density atherosclerosis.   CT Perfusion - Occlusion of  the right posterior cerebral artery P1 segment with 13 mL area of ischemic penumbra in the right occipital lobe.   Carotid Doppler - CTA neck ordered - carotid dopplers not indicated.  2D Echo - EF 70 to 75%. No cardiac source of emboli identified.   TEE and LOOP requested and pending  [employer] Virus 2 - negative  LDL - 105  HgbA1c - 9.3  UDS - not ordered  VTE prophylaxis - SCDs Diet: Carb restricted  No antithrombotic or antiplatelet prior to admission, now on  aspirin 81 mg daily and Plavix 75 mg daily x 3 weeks   Patient will be counseled to be compliant with her antithrombotic medications  Ongoing aggressive stroke risk factor management  Therapy recommendations:  CIR recommended -  accepted with insurance pending  Disposition:  CIR pending  Hypertension  Home BP meds: Cozaar and HCTZ  Current BP  meds: On home cozaar, normodyne prn  Stable, hypertensive   Permissive hypertension bp less than 180 . Long-term BP goal normotensive  Hyperlipidemia  Home Lipid lowering medication: none  LDL 105, goal < 70  Current lipid lowering medication: will add Lipitor 40 mg daily  Continue statin at discharge  Diabetes  Home diabetic meds: Actos 45, Januvia 100, Glucotrol 5 mg,  Metformin 1000mg   bid  Current diabetic meds: insulin  HgbA1c 9.3, goal < 7.0 Recent Labs    04/22/21 2112 04/23/21 0617 04/23/21 1114  GLUCAP 239* 228* 316*  Diabetes coordinator consult appreciated, will add Levimir 10units BID, Novolog meal time coverage of 3 units, SSI to be changed to  moderate scale  Other Stroke Risk Factors  Advanced age  Former cigarette smoker - quit  ETOH use, advised to drink no more than 1 alcoholic beverage per day.  Obesity, recommend weight loss, diet and exercise as appropriate   Other Active Problems, Findings, Recommendations and/or Plan  Code status - Full Code  Aortic Atherosclerosis (ICD10-I70.0).  CKD - stage  3a - creatinine - 1.09  Hospital day # 4  Continue ongoing therapies.       Continue aspirin Plavix for 3 weeks followed by aspirin alone.  Transfer to rehab when bed available.    Antony Contras, MD Medical Director [facility] Pager: [phone] 04/23/2021 12:48 PM  To contact Stroke Continuity provider, please refer to [email or web address] After hours, contact General Neurology

## 2021-04-24 ENCOUNTER — Encounter (HOSPITAL_COMMUNITY)
Admission: EM | Disposition: A | Discharge status: Rehabilitation Facility | Payer: Self-pay | Source: Other Acute Inpatient Hospital | Attending: Neurology

## 2021-04-24 ENCOUNTER — Inpatient Hospital Stay (HOSPITAL_COMMUNITY): Payer: 59 | Admitting: Registered Nurse

## 2021-04-24 ENCOUNTER — Encounter (HOSPITAL_COMMUNITY): Payer: Self-pay | Admitting: Student in an Organized Health Care Education/Training Program

## 2021-04-24 ENCOUNTER — Inpatient Hospital Stay (HOSPITAL_COMMUNITY): Payer: 59

## 2021-04-24 DIAGNOSIS — I639 Cerebral infarction, unspecified: Secondary | ICD-10-CM

## 2021-04-24 DIAGNOSIS — I152 Hypertension secondary to endocrine disorders: Secondary | ICD-10-CM

## 2021-04-24 DIAGNOSIS — E1159 Type 2 diabetes mellitus with other circulatory complications: Secondary | ICD-10-CM

## 2021-04-24 DIAGNOSIS — I358 Other nonrheumatic aortic valve disorders: Secondary | ICD-10-CM | POA: Diagnosis present

## 2021-04-24 HISTORY — PX: TEE WITHOUT CARDIOVERSION: SHX5443

## 2021-04-24 HISTORY — PX: BUBBLE STUDY: SHX6837

## 2021-04-24 LAB — GLUCOSE, CAPILLARY
Glucose-Capillary: 249 mg/dL — ABNORMAL HIGH (ref 70–99)
Glucose-Capillary: 187 mg/dL — ABNORMAL HIGH (ref 70–99)
Glucose-Capillary: 230 mg/dL — ABNORMAL HIGH (ref 70–99)
Glucose-Capillary: 159 mg/dL — ABNORMAL HIGH (ref 70–99)

## 2021-04-24 LAB — CBC WITH DIFFERENTIAL/PLATELET
Abs Immature Granulocytes: 0.04 10*3/uL (ref 0.00–0.07)
Basophils Absolute: 0 10*3/uL (ref 0.0–0.1)
Basophils Relative: 0 %
Eosinophils Absolute: 0.3 10*3/uL (ref 0.0–0.5)
Eosinophils Relative: 4 %
HCT: 37.4 % (ref 36.0–46.0)
Hemoglobin: 12.6 g/dL (ref 12.0–15.0)
Immature Granulocytes: 1 %
Lymphocytes Relative: 11 %
Lymphs Abs: 0.9 10*3/uL (ref 0.7–4.0)
MCH: 29 pg (ref 26.0–34.0)
MCHC: 33.7 g/dL (ref 30.0–36.0)
MCV: 86 fL (ref 80.0–100.0)
Monocytes Absolute: 0.6 10*3/uL (ref 0.1–1.0)
Monocytes Relative: 8 %
Neutro Abs: 6.1 10*3/uL (ref 1.7–7.7)
Neutrophils Relative %: 76 %
Platelets: 248 10*3/uL (ref 150–400)
RBC: 4.35 MIL/uL (ref 3.87–5.11)
RDW: 13.2 % (ref 11.5–15.5)
WBC: 8 10*3/uL (ref 4.0–10.5)
nRBC: 0 % (ref 0.0–0.2)

## 2021-04-24 SURGERY — ECHOCARDIOGRAM, TRANSESOPHAGEAL
Anesthesia: Monitor Anesthesia Care

## 2021-04-24 SURGERY — LOOP RECORDER INSERTION

## 2021-04-24 MED ORDER — PROPOFOL 10 MG/ML IV BOLUS
INTRAVENOUS | Status: DC | PRN
  Administered 2021-04-24: 20 mg via INTRAVENOUS
  Administered 2021-04-24: 10 mg via INTRAVENOUS
  Administered 2021-04-24: 200 ug/kg/min via INTRAVENOUS
  Administered 2021-04-24: 125 ug/kg/min via INTRAVENOUS

## 2021-04-24 MED ORDER — GUAIFENESIN 100 MG/5ML PO SOLN
5.0000 mL | ORAL | Status: DC | PRN
  Administered 2021-04-24 – 2021-04-29 (×9): 100 mg via ORAL
  Filled 2021-04-24 (×8): qty 5

## 2021-04-24 MED ORDER — HYDROCODONE-ACETAMINOPHEN 5-325 MG PO TABS
1.0000 | ORAL_TABLET | Freq: Once | ORAL | Status: AC
  Administered 2021-04-24: 1 via ORAL
  Filled 2021-04-24: qty 1

## 2021-04-24 MED ORDER — METOPROLOL TARTRATE 50 MG PO TABS
100.0000 mg | ORAL_TABLET | Freq: Once | ORAL | Status: AC | PRN
  Administered 2021-04-25: 100 mg via ORAL
  Filled 2021-04-24: qty 2

## 2021-04-24 MED ORDER — PHENYLEPHRINE 40 MCG/ML (10ML) SYRINGE FOR IV PUSH (FOR BLOOD PRESSURE SUPPORT)
PREFILLED_SYRINGE | INTRAVENOUS | Status: DC | PRN
  Administered 2021-04-24 (×2): 80 ug via INTRAVENOUS

## 2021-04-24 NOTE — Progress Notes (Signed)
Pt BP went up to the 180's during shift and prn ordered given effective. Pt BP remained stable and within reach, med medication administered effective and pt sleeping comfortably in bed with call light within reach. Reported off to oncoming RN. Delia Heady RN

## 2021-04-24 NOTE — Consult Note (Addendum)
ELECTROPHYSIOLOGY CONSULT NOTE  Patient ID: [name] MRN: [id number], DOB/AGE: [birth year]   Admit date: 04/19/2021 Date of Consult: 04/24/2021  Primary Physician: Mar Daring, PA-C Primary Cardiologist: none Reason for Consultation: Cryptogenic stroke; recommendations regarding Implantable Loop Recorder, requested by Dr. Leonie Man  History of Present Illness [name] was admitted on 04/19/2021 , initially presented to [facility] with acute dizziness, L sided weakness and visual changes, She received IV tPA  via telemedicine at [facility] 04/18/2021 and was found to have occlusion of the right P2/PCA and was taken to IR for thrombectomy. MT performed with combined stentriever and aspiration. Total of 1 pass with complete recanalization (TICI3 .    PMHx includes: DM (poorlly controlled), HTN, HLD, obesity, anxiety/depression Neurology notes: acute nonhemorrhagic lateral aspect of the right thalamus. Acute nonhemorrhagic infarcts involving the more superior and medial right thalamus, the right occipital lobe, the left side of the splenium of the corpus callosum, and the right cerebellum - embolic - etiology unknown..  she has undergone workup for stroke including echocardiogram and carotid angio.  The patient has been monitored on telemetry which has demonstrated sinus rhythm with no arrhythmias.  Inpatient stroke work-up is to be completed with a TEE today   Echocardiogram this admission demonstrated  IMPRESSIONS  1. Left ventricular ejection fraction, by estimation, is 70 to 75%. The  left ventricle has hyperdynamic function. The left ventricle has no  regional wall motion abnormalities. Left ventricular diastolic parameters  are consistent with Grade I diastolic  dysfunction (impaired relaxation). Elevated left atrial pressure.  2. Right ventricular systolic function is normal. The right ventricular  size is normal. Tricuspid regurgitation signal is inadequate for assessing   PA pressure.  3. Left atrial size was mildly dilated.  4. The mitral valve is normal in structure. Mild mitral valve  regurgitation. No evidence of mitral stenosis.  5. The aortic valve is normal in structure. Aortic valve regurgitation is  not visualized. No aortic stenosis is present.  6. The inferior vena cava is normal in size with greater than 50%  respiratory variability, suggesting right atrial pressure of 3 mmHg.    Lab work is reviewed.   Prior to admission, the patient denies chest pain, shortness of breath, dizziness, palpitations, or syncope.  They are recovering from their stroke with plans to CIR, at discharge. She has been accepted to CIR pending insurance      Past Medical History:  Diagnosis Date  . Anxiety   . Arthritis   . Cataract    left  . Depression   . Diabetes mellitus without complication ([place])   . Environmental and seasonal allergies   . GERD (gastroesophageal reflux disease)   . Hyperlipidemia   . Hypertension   . Joint pain    as reported by patient  . Urinary incontinence    as stated by patient     Surgical History:  Past Surgical History:  Procedure Laterality Date  . BREAST EXCISIONAL BIOPSY Right 1999   neg  . BREAST LUMPECTOMY Right 2005   as reported by patient  . CATARACT EXTRACTION  January 2013  . CATARACT EXTRACTION W/PHACO Left 12/02/2016   Procedure: CATARACT EXTRACTION PHACO AND INTRAOCULAR LENS PLACEMENT (IOC);  Surgeon: Estill Cotta, MD;  Location: [facility] ORS;  Service: Ophthalmology;  Laterality: Left;  [place] 2:14 AP% 28.4 CDE 59.73 Fluid pack lot # [id number]  . DIAGNOSTIC LAPAROSCOPY    . IR CT HEAD LTD  04/19/2021  . IR  PERCUTANEOUS ART THROMBECTOMY/INFUSION INTRACRANIAL INC DIAG ANGIO  04/19/2021      . IR PERCUTANEOUS ART THROMBECTOMY/INFUSION INTRACRANIAL INC DIAG ANGIO  04/19/2021  . IR US GUIDE VASC ACCESS LEFT  04/19/2021  . RADIOLOGY WITH ANESTHESIA N/A 04/19/2021   Procedure: IR WITH ANESTHESIA;  Surgeon:  Luanne Bras, MD;  Location: [place];  Service: Radiology;  Laterality: N/A;  . TOTAL HIP ARTHROPLASTY Right 04/04/2018   Procedure: TOTAL HIP ARTHROPLASTY;  Surgeon: Dereck Leep, MD;  Location: [facility] ORS;  Service: Orthopedics;  Laterality: Right;     Medications Prior to Admission  Medication Sig Dispense Refill Last Dose  . glipiZIDE (GLUCOTROL XL) 5 MG 24 hr tablet Take 5 mg by mouth daily with breakfast.   04/18/2021  . hydrochlorothiazide (HYDRODIURIL) 25 MG tablet Take 25 mg by mouth daily.   04/18/2021  . losartan (COZAAR) 25 MG tablet Take 25 mg by mouth 2 (two) times daily.   04/18/2021  . metFORMIN (GLUCOPHAGE) 1000 MG tablet Take 1,000 mg by mouth 2 (two) times daily with a meal.   04/18/2021  . Multiple Vitamin (MULTIVITAMIN) tablet Take 1 tablet by mouth daily.   04/18/2021  . pioglitazone (ACTOS) 45 MG tablet Take 45 mg by mouth daily.   04/18/2021  . rOPINIRole (REQUIP) 1 MG tablet Take 1 mg by mouth at bedtime.   04/17/2021  . sertraline (ZOLOFT) 100 MG tablet Take 100 mg by mouth daily.   04/18/2021  . simvastatin (ZOCOR) 20 MG tablet Take 20 mg by mouth at bedtime.   04/17/2021  . sitaGLIPtin (JANUVIA) 100 MG tablet Take 100 mg by mouth daily.   04/18/2021    Inpatient Medications:  . aspirin  81 mg Oral Daily  . atorvastatin  40 mg Oral Daily  . Chlorhexidine Gluconate Cloth  6 each Topical Daily  . clopidogrel  75 mg Oral Q breakfast  . insulin aspart  0-15 Units Subcutaneous TID WC  . insulin aspart  0-5 Units Subcutaneous QHS  . insulin aspart  5 Units Subcutaneous TID WC  . insulin detemir  12 Units Subcutaneous BID  . labetalol  20 mg Intravenous Once  . losartan  25 mg Oral BID  . pantoprazole  40 mg Oral QHS    Allergies:  Allergies  Allergen Reactions  . Jardiance [Empagliflozin] Itching    Recurrent mycotic infections  . Penicillins Rash and Other (See Comments)    Has patient had a PCN reaction causing immediate rash, facial/tongue/throat swelling, SOB or  lightheadedness with hypotension: No Has patient had a PCN reaction causing severe rash involving mucus membranes or skin necrosis: No Has patient had a PCN reaction that required hospitalization No Has patient had a PCN reaction occurring within the last 10 years: No If all of the above answers are "NO", then may proceed with Cephalosporin use.     Social History   Socioeconomic History  . Marital status: Widowed    Spouse name: Not on file  . Number of children: Not on file  . Years of education: Not on file  . Highest education level: Not on file  Occupational History    Employer: [employer]  Tobacco Use  . Smoking status: Former Smoker    Packs/day: 1.50    Years: 25.00    Pack years: 37.50    Types: Cigarettes    Quit date: 03/24/2007    Years since quitting: 14.0  . Smokeless tobacco: Never Used  Vaping Use  . Vaping Use: Never  used  Substance and Sexual Activity  . Alcohol use: Yes    Alcohol/week: 0.0 standard drinks    Comment: 1- every 3-4 months  . Drug use: No  . Sexual activity: Not on file  Other Topics Concern  . Not on file  Social History Narrative  . Not on file   Social Determinants of Health   Financial Resource Strain: Not on file  Food Insecurity: Not on file  Transportation Needs: Not on file  Physical Activity: Not on file  Stress: Not on file  Social Connections: Not on file  Intimate Partner Violence: Not on file     Family History  Problem Relation Age of Onset  . Bone cancer Brother   . Aneurysm Mother        brain  . Diabetes Father   . CAD Father   . Heart failure Father   . Colon cancer Father   . Cancer Father   . Alzheimer's disease Paternal Grandmother   . Dementia Paternal Grandmother   . Mental illness Paternal Grandfather   . Healthy Daughter   . Healthy Son   . Breast cancer Neg Hx       Review of Systems: All other systems reviewed and are otherwise negative except as noted above.  Physical Exam: Vitals:    04/23/21 1943 04/23/21 2332 04/24/21 0013 04/24/21 0316  BP: (!) 184/77 (!) 183/81 (!) 162/77 (!) 146/82  Pulse: 70 73 73 77  Resp: 17 17  17   Temp: 97.9 F (36.6 C) 98.2 F (36.8 C)  98.2 F (36.8 C)  TempSrc: Oral Oral    SpO2: 97% 93% 93% 95%  Weight:      Height:        GEN- The patient is well appearing, alert and oriented x 3 today.   Head- normocephalic, atraumatic Eyes-  Sclera clear, conjunctiva pink Ears- hearing intact Oropharynx- clear Neck- supple Lungs- CTA b/l, normal work of breathing Heart- RRR, no murmurs, rubs or gallops  GI- soft, NT, ND Extremities- no clubbing, cyanosis, or edema MS- no significant deformity or atrophy Skin- no rash or lesion Psych- euthymic mood, full affect   Labs:   Lab Results  Component Value Date   WBC 9.0 04/18/2021   HGB 12.9 04/18/2021   HCT 38.0 04/18/2021   MCV 84.1 04/18/2021   PLT 259 04/18/2021    Recent Labs  Lab 04/18/21 1821 04/20/21 0943 04/21/21 0834  NA 133*   < > 136  K 4.6   < > 4.0  CL 96*   < > 101  CO2 27   < > 27  BUN 25*   < > 13  CREATININE 1.09*   < > 0.88  CALCIUM 9.0   < > 9.2  PROT 8.0  --   --   BILITOT 0.6  --   --   ALKPHOS 45  --   --   ALT 16  --   --   AST 23  --   --   GLUCOSE 272*   < > 197*   < > = values in this interval not displayed.   Lab Results  Component Value Date   CKTOTAL 120 09/06/2013   TROPONINI <0.03 02/25/2016   Lab Results  Component Value Date   CHOL 195 04/19/2021   CHOL 233 (H) 09/20/2020   CHOL 232 (H) 08/24/2019   Lab Results  Component Value Date   HDL 46 04/19/2021   HDL 47 09/20/2020  HDL 38 (L) 08/24/2019   Lab Results  Component Value Date   LDLCALC 105 (H) 04/19/2021   LDLCALC 152 (H) 09/20/2020   LDLCALC 139 (H) 08/24/2019   Lab Results  Component Value Date   TRIG 221 (H) 04/19/2021   TRIG 187 (H) 09/20/2020   TRIG 302 (H) 08/24/2019   Lab Results  Component Value Date   CHOLHDL 4.2 04/19/2021   CHOLHDL 6.1 (H)  08/24/2019   CHOLHDL 3.3 11/13/2016   No results found for: LDLDIRECT  No results found for: DDIMER   Radiology/Studies:   MR ANGIO HEAD WO CONTRAST Result Date: 04/18/2021 CLINICAL DATA:  Possible stroke.  Left arm weakness and numbness EXAM: MRI HEAD WITHOUT CONTRAST MRA HEAD WITHOUT CONTRAST TECHNIQUE: Multiplanar, multiecho pulse sequences of the brain and surrounding structures were obtained without intravenous contrast. Angiographic images of the head were obtained using MRA technique without contrast. COMPARISON:  None. FINDINGS: MRI HEAD FINDINGS Brain: Small acute infarct of the anterolateral right thalamus adjacent to the posterior limb of the right internal capsule. No other acute ischemia. No acute or chronic hemorrhage. Normal white matter signal, parenchymal volume and CSF spaces. The midline structures are normal. Vascular: Major flow voids are preserved. Skull and upper cervical spine: Normal calvarium and skull base. Visualized upper cervical spine and soft tissues are normal. Sinuses/Orbits:No paranasal sinus fluid levels or advanced mucosal thickening. No mastoid or middle ear effusion. Normal orbits. MRA HEAD FINDINGS POSTERIOR CIRCULATION: --Vertebral arteries: Loss of normal flow related enhancement within the V4 segment of the right vertebral artery. The area of enhancement at the most distal aspect of the V4 segment is likely due to retrograde flow across the vertebrobasilar junction. Normal left V4 segment. --Inferior cerebellar arteries: Normal. --Basilar artery: Normal. --Superior cerebellar arteries: Normal. --Posterior cerebral arteries: Right PCA mid P1 segment occlusion. Left PCA is normal. ANTERIOR CIRCULATION: --Intracranial internal carotid arteries: Normal. --Anterior cerebral arteries (ACA): Normal. --Middle cerebral arteries (MCA): Normal. ANATOMIC VARIANTS: None IMPRESSION: 1. Small acute infarct of the anterolateral right thalamus adjacent to the posterior limb of the  right internal capsule. 2. Right PCA mid P1 segment occlusion. Loss of normal flow related enhancement in the right vertebral artery V4 segment. These results were called by telephone at the time of interpretation on 04/18/2021 at 10:26 pm to provider [facility] , who verbally acknowledged these results. Electronically Signed   By: Ulyses Jarred M.D.   On: 04/18/2021 22:26    MR BRAIN WO CONTRAST Result Date: 04/19/2021 CLINICAL DATA:  Stroke, follow-up. Status post recanalization of right P2 occlusion. EXAM: MRI HEAD WITHOUT CONTRAST TECHNIQUE: Multiplanar, multiecho pulse sequences of the brain and surrounding structures were obtained without intravenous contrast. COMPARISON:  MR head 04/18/2021 FINDINGS: Brain: The diffusion-weighted images again demonstrate an acute nonhemorrhagic infarct within the lateral aspect of the right thalamus. Additional acute nonhemorrhagic infarct is now present within the more superomedial right thalamus. A punctate nonhemorrhagic infarct is present in the left thalamus. Punctate infarct noted in the right cerebral peduncle. Scattered areas of restricted diffusion are present in the medial right occipital lobe. Acute punctate nonhemorrhagic infarct is present in the left side of the splenium of the corpus callosum. A punctate acute infarct is suspected in the right cerebellum. T2 signal changes are associated with these areas. No acute hemorrhage is present. No significant mass effect is present. Periventricular white matter changes are otherwise stable. No significant extraaxial fluid collection is present. The brainstem and cerebellum are otherwise normal. Vascular: Flow  is present in the major intracranial arteries. Flow void better demonstrated in the proximal right PCA. Skull and upper cervical spine: The craniocervical junction is normal. Upper cervical spine is within normal limits. Marrow signal is unremarkable. Sinuses/Orbits: The paranasal sinuses and mastoid air  cells are clear. Bilateral lens replacements are noted. Globes and orbits are otherwise unremarkable. IMPRESSION: 1. Stable acute nonhemorrhagic infarct involving the lateral aspect of the right thalamus. 2. Additional acute nonhemorrhagic infarcts involving the more superior and medial right thalamus, the right occipital lobe, the left side of the splenium of the corpus callosum, and the right cerebellum. 3. Stable white matter disease. This likely reflects the sequela of chronic microvascular ischemia. Electronically Signed   By: San Morelle M.D.   On: 04/19/2021 17:32      IR CT Head Ltd Result Date: 04/21/2021 INDICATION: 65 year old woman, right-hand-dominant, with a past medical history significant for poorly controlled diabetes, hypertension, hyperlipidemia, obesity, right hip replacement, anxiety/depression, presenting with acute onset dizziness, left-sided weakness, left-sided numbness, NIHSS 5. Her last known well was at 6 p.m. on 04/18/2021. She received IV tPA at 7:10 p.m. on 04/18/2021. Initially, she was thought to have a lacunar infarct. However, subsequent MRI/MRA of the head showed a right P1/PCA occlusion. Subsequently, patient patient was found to have worsening neglect on the left side with possible visual field deficit. CT/CT angiogram of the head and confirmed a right PCA 1/PCA occlusion and CT perfusion showed a 13 mL penumbra in the right PCA territory. After long discussion held by Dr. Amie Portland, MD, patient and her son, decision 1 made to proceed with a diagnostic cerebral angiogram and mechanical thrombectomy. EXAM: FLUOROSCOPY GUIDED VASCULAR ACCESS DIAGNOSTIC CEREBRAL ANGIOGRAM MECHANICAL THROMBECTOMY FLAT PANEL HEAD CT COMPARISON:  CT/CT angiogram of the head and neck Apr 18, 2021 MEDICATIONS: Refer to anesthesia documentation. ANESTHESIA/SEDATION: The procedure was performed in the general anesthesia. CONTRAST:  40 mL of Omnipaque 240 mg/mL. FLUOROSCOPY TIME:   Fluoroscopy Time: 14 minutes 42 seconds (543 mGy). COMPLICATIONS: None immediate. TECHNIQUE: Informed written consent was obtained from the patient and her son after a thorough discussion of the procedural risks, benefits and alternatives. All questions were addressed. Maximal Sterile Barrier Technique was utilized including caps, mask, sterile gowns, sterile gloves, sterile drape, hand hygiene and skin antiseptic. A timeout was performed prior to the initiation of the procedure. Using the modified Seldinger technique and a micropuncture kit, access was gained to the distal left radial artery at the anatomical snuffbox and a 6 French sheath was placed. Slow intra arterial infusion of 478 mcg nitroglicerin diluted in patient's own blood was performed. No significant fluctuation in patient's blood pressure seen. Then, a left radial artery roadmap was obtained via sheath side port. Next, a benchmark guide catheter was navigated over a Berenstein 2 catheter and a 0.035 " Terumo Glidewire into the left subclavian artery under fluoroscopic guidance. Frontal and lateral angiograms of the neck were obtained. The catheter was then navigated over the wire into the distal V2 segment of the left vertebral artery. Frontal and lateral views of the head were obtained. FINDINGS: 1. Normal brachial artery branching pattern seen. No significant anatomical variation. The left radial artery caliber is adequate for vascular access. 2. Atherosclerotic changes of the left subclavian artery with approximately 60% stenosis proximal to the origin of the left vertebral artery. 3. Occlusion of the proximal right P2/PCA. PROCEDURE: Under biplane roadmap, a Knippa aspiration catheter was navigated over a phenom 21 microcatheter and a  synchro support microguidewire into the basilar artery. The microcatheter was then navigated over the wire into the right P3/PCA segment. Then, a 5 x 37 mm embotrap stent retriever was deployed spanning the  right P1 and P2 segments. The device was allowed to intercalated with the clot for 4 minutes. The microcatheter was removed. The aspiration catheter was advanced to the level of occlusion and connected to a penumbra aspiration pump. The thrombectomy device and aspiration catheter were removed under constant aspiration. Right vertebral artery angiogram showed recanalization of the right PCA with slow flow at the P4 segment (TICI 2C). Flat panel CT of the head was obtained and post processed in a separate workstation with concurrent attending physician supervision. Selected images were sent to PACS. No evidence of hemorrhagic complication noted. The catheter was subsequently withdrawn. An inflatable band was placed and inflated over the left hand access site. The vascular sheath was withdrawn and the band was slowly deflated until brisk flow was noted through the arteriotomy site. At this point, the band was reinflated with additional 2 cc of air to obtain patent hemostasis. IMPRESSION: Successful mechanical thrombectomy with combined stent retriever and aspiration for treatment of a right P2/PCA occlusion with complete recanalization (TICI2C). PLAN: 1. Deflation of radial pain per protocol. 2. Transfer to ICU for further care. 3. Goal SBP 120-140 mg/Hg. Electronically Signed   By: Pedro Earls M.D.   On: 04/21/2021 16:02     CT HEAD CODE STROKE WO CONTRAST Result Date: 04/18/2021 CLINICAL DATA:  Code stroke. New onset of dizziness and left-sided weakness. Last known well 45 minutes ago. EXAM: CT HEAD WITHOUT CONTRAST TECHNIQUE: Contiguous axial images were obtained from the base of the skull through the vertex without intravenous contrast. COMPARISON:  CT head without contrast 11/05/2010 FINDINGS: Brain: No acute infarct, hemorrhage, or mass lesion is present. No significant white matter lesions are present. The ventricles are of normal size. Basal ganglia are intact. Insular ribbon is normal. No  acute or focal cortical abnormality is present. No significant extraaxial fluid collection is present. Vascular: Atherosclerotic calcifications are present within the cavernous internal carotid arteries. No hyperdense vessel is present. Skull: Calvarium is intact. No focal lytic or blastic lesions are present. Sinuses/Orbits: The paranasal sinuses and mastoid air cells are clear. Bilateral lens replacements are noted. Globes and orbits are otherwise unremarkable. ASPECTS [facility] Stroke Program Early CT Score) - Ganglionic level infarction (caudate, lentiform nuclei, internal capsule, insula, M1-M3 cortex): 7/7 - Supraganglionic infarction (M4-M6 cortex): 3/3 Total score (0-10 with 10 being normal): 10/10 IMPRESSION: 1. Normal CT appearance of the brain. 2. ASPECTS is 10/10. These results were called by telephone at the time of interpretation on 04/18/2021 at 6:50 pm to provider [facility] , who verbally acknowledged these results. Electronically Signed   By: San Morelle M.D.   On: 04/18/2021 18:50     CT ANGIO HEAD NECK W WO CM W PERF (CODE STROKE) Result Date: 04/18/2021 CLINICAL DATA:  Stroke follow-up.  Left-sided weakness EXAM: CT ANGIOGRAPHY HEAD AND NECK CT PERFUSION BRAIN TECHNIQUE: Multidetector CT imaging of the head and neck was performed using the standard protocol during bolus administration of intravenous contrast. Multiplanar CT image reconstructions and MIPs were obtained to evaluate the vascular anatomy. Carotid stenosis measurements (when applicable) are obtained utilizing NASCET criteria, using the distal internal carotid diameter as the denominator. Multiphase CT imaging of the brain was performed following IV bolus contrast injection. Subsequent parametric perfusion maps were calculated using RAPID software.  CONTRAST:  112m OMNIPAQUE IOHEXOL 350 MG/ML SOLN COMPARISON:  MRA head 04/18/2021 FINDINGS: CTA NECK FINDINGS SKELETON: There is no bony spinal canal stenosis. No lytic or  blastic lesion. OTHER NECK: Normal pharynx, larynx and major salivary glands. No cervical lymphadenopathy. Unremarkable thyroid gland. UPPER CHEST: No pneumothorax or pleural effusion. No nodules or masses. AORTIC ARCH: There CS calcific atherosclerosis of the aortic arch. There is no aneurysm, dissection or hemodynamically significant stenosis of the visualized portion of the aorta. Conventional 3 vessel aortic branching pattern. Mild narrowing of the proximal left subclavian artery. RIGHT CAROTID SYSTEM: No dissection, occlusion or aneurysm. There is mixed density atherosclerosis extending into the proximal ICA, resulting in 50% stenosis. LEFT CAROTID SYSTEM: No dissection, occlusion or aneurysm. Mild atherosclerotic calcification at the carotid bifurcation without hemodynamically significant stenosis. VERTEBRAL ARTERIES: Left dominant configuration. Both origins are clearly patent. There is no dissection, occlusion or flow-limiting stenosis to the skull base (V1-V3 segments). CTA HEAD FINDINGS POSTERIOR CIRCULATION: --Vertebral arteries: The right vertebral artery terminates in PICA. The vessel originating from the proximal right basilar is the anterior inferior cerebellar artery. --Inferior cerebellar arteries: Normal. --Basilar artery: Normal. --Superior cerebellar arteries: Normal. --Posterior cerebral arteries (PCA): Right P1 segment is occluded. Normal left PCA. ANTERIOR CIRCULATION: --Intracranial internal carotid arteries: Normal. --Anterior cerebral arteries (ACA): Normal. Both A1 segments are present. Patent anterior communicating artery (a-comm). --Middle cerebral arteries (MCA): Normal. VENOUS SINUSES: As permitted by contrast timing, patent. ANATOMIC VARIANTS: None Review of the MIP images confirms the above findings. CT Brain Perfusion Findings: CBF (<30%) Volume: 052mPerfusion (Tmax>6.0s) volume: 1389mismatch Volume: 54m16mfarction Location:No completed infarct visible via CT perfusion. On the  earlier MRI, there is a right thalamic infarct. The penumbra is located in the right occipital lobe. IMPRESSION: 1. Occlusion of the right posterior cerebral artery P1 segment with 13 mL area of ischemic penumbra in the right occipital lobe. 2. 50 % stenosis of the proximal right internal carotid artery secondary to mixed density atherosclerosis. Aortic Atherosclerosis (ICD10-I70.0). These results were communicated to Dr. AshiAmie Portland11:47 pm on 04/18/2021 by text page via the [facility] system. Electronically Signed   By: KeviUlyses Jarred.   On: 04/18/2021 23:48    12-lead ECG SR All prior EKG's in EPIC reviewed with no documented atrial fibrillation  Telemetry SR  Assessment and Plan:  1. Cryptogenic stroke The patient presents with cryptogenic stroke.  The patient has a TEE planned for today.  I spoke at length with the patient (and he son MichLegrand Comoa telephone) about monitoring for afib with either a 30 day event monitor or an implantable loop recorder.  Risks, benefits, and alteratives to implantable loop recorder were discussed with the patient today.   At this time, the patient and her sone  very clear in their decision to proceed with implantable loop recorder.   Wound care was reviewed with the patient (keep incision clean and dry for 3 days).  Wound check follow up will be scheduled for the patient.  Please call with questions.   ReneBaldwin [place]-C 04/24/2021   I have seen, examined the patient, and reviewed the above assessment and plan.  Changes to above are made where necessary.  On exam, RRR.  She presents with stroke.  I agree with Dr SethLenell Antucerns for cardioembolic source.  TEE is pending.  If TEE is unrevealing, we should proceed with ILR.  Risks and benefits to ILR were discussed in detail with the patient who  wishes to proceed.   Co Sign: Thompson Grayer, MD 04/24/2021 10:39 AM    Addendum: Per Dr Radford Pax, the patient has a mass on the aortic valve which is  the likely source for stroke.  She will review with Dr Leonie Man.  We will cancel ILR implantation.    Electrophysiology team to see as needed while here. Please call with questions.   Thompson Grayer MD, [facility] 04/24/2021 1:46 PM

## 2021-04-24 NOTE — Progress Notes (Signed)
Reports non productive cough today   STROKE TEAM PROGRESS NOTE    INTERVAL HISTORY No acute events Reports non productive cough today. No fever or chills. Neuro exam is unchanged.  Vital signs stable. Yet await insurance approval for transfer to inpatient rehab TEE and loop recorder scheduled for later today OBJECTIVE Vitals:   04/23/21 2332 04/24/21 0013 04/24/21 0316 04/24/21 0844  BP: (!) 183/81 (!) 162/77 (!) 146/82 (!) 130/94  Pulse: 73 73 77 77  Resp: 17  17 18   Temp: 98.2 F (36.8 C)  98.2 F (36.8 C) 97.8 F (36.6 C)  TempSrc: Oral   Oral  SpO2: 93% 93% 95% 100%  Weight:      Height:        CBC:  Recent Labs  Lab 04/18/21 1821  WBC 9.0  NEUTROABS 5.7  HGB 12.9  HCT 38.0  MCV 84.1  PLT 762    Basic Metabolic Panel:  Recent Labs  Lab 04/20/21 0943 04/21/21 0834  NA 132* 136  K 3.9 4.0  CL 99 101  CO2 24 27  GLUCOSE 256* 197*  BUN 10 13  CREATININE 0.89 0.88  CALCIUM 8.8* 9.2    Lipid Panel:     Component Value Date/Time   CHOL 195 04/19/2021 0427   CHOL 233 (H) 09/20/2020 1132   CHOL 205 (H) 09/06/2013 1518   TRIG 221 (H) 04/19/2021 0427   TRIG 174 09/06/2013 1518   HDL 46 04/19/2021 0427   HDL 47 09/20/2020 1132   HDL 53 09/06/2013 1518   CHOLHDL 4.2 04/19/2021 0427   VLDL 44 (H) 04/19/2021 0427   VLDL 35 09/06/2013 1518   LDLCALC 105 (H) 04/19/2021 0427   LDLCALC 152 (H) 09/20/2020 1132   LDLCALC 117 (H) 09/06/2013 1518   HgbA1c:  Lab Results  Component Value Date   HGBA1C 9.3 (H) 04/19/2021   Urine Drug Screen: No results found for: LABOPIA, COCAINSCRNUR, LABBENZ, AMPHETMU, THCU, LABBARB  Alcohol Level No results found for: ETH  IMAGING  MR BRAIN WO CONTRAST MR ANGIO HEAD WO CONTRAST 04/18/2021 IMPRESSION:  1. Small acute infarct of the anterolateral right thalamus adjacent to the posterior limb of the right internal capsule.  2. Right PCA mid P1 segment occlusion. Loss of normal flow related enhancement in the right  vertebral artery V4 segment.  MR BRAIN WO CONTRAST 04/19/2021 IMPRESSION:  1. Stable acute nonhemorrhagic infarct involving the lateral aspect of the right thalamus.  2. Additional acute nonhemorrhagic infarcts involving the more superior and medial right thalamus, the right occipital lobe, the left side of the splenium of the corpus callosum, and the right cerebellum.  3. Stable white matter disease. This likely reflects the sequela of chronic microvascular ischemia.    ECHOCARDIOGRAM COMPLETE 04/19/2021 IMPRESSIONS   1. Left ventricular ejection fraction, by estimation, is 70 to 75%. The left ventricle has hyperdynamic function. The left ventricle has no regional wall motion abnormalities. Left ventricular diastolic parameters are consistent with Grade I diastolic dysfunction (impaired relaxation). Elevated left atrial pressure.   2. Right ventricular systolic function is normal. The right ventricular size is normal. Tricuspid regurgitation signal is inadequate for assessing PA pressure.   3. Left atrial size was mildly dilated.   4. The mitral valve is normal in structure. Mild mitral valve regurgitation. No evidence of mitral stenosis.   5. The aortic valve is normal in structure. Aortic valve regurgitation is not visualized. No aortic stenosis is present.   6. The inferior vena cava  is normal in size with greater than 50% respiratory variability, suggesting right atrial pressure of 3 mmHg.   CT HEAD CODE STROKE WO CONTRAST 04/18/2021 IMPRESSION:  1. Normal CT appearance of the brain.  2. ASPECTS is 10/10.   CT ANGIO HEAD NECK W WO CM W PERF (CODE STROKE) 04/18/2021 IMPRESSION:  1. Occlusion of the right posterior cerebral artery P1 segment with 13 mL area of ischemic penumbra in the right occipital lobe.  2. 50 % stenosis of the proximal right internal carotid artery secondary to mixed density atherosclerosis.  Aortic Atherosclerosis (ICD10-I70.0).   ECG - SR rate 82 BPM. Possible old  MI. (See cardiology reading for complete details)  PHYSICAL EXAM Blood pressure (!) 130/94, pulse 77, temperature 97.8 F (36.6 C), temperature source Oral, resp. rate 18, height 5\' 8"  (1.727 m), weight 99 kg, SpO2 100 %.  GENERAL: Pleasant middle-aged Caucasian lady she is awake and alert and responsive.  HEENT: This is normal.  ABDOMEN: soft  EXTREMITIES: No edema   BACK: Normal  SKIN: Normal by inspection.    MENTAL STATUS: Alert and oriented. Speech, language and cognition are generally intact. Judgment and insight normal.   CRANIAL NERVES: Pupils are equal, round and reactive to light and accomodation; extra ocular movements are full, there is no significant nystagmus; visual fields are full; there is flattening of the nasolabial fold on left side; tongue is midline  MOTOR: Direct strength is 5/5 but there is left hemiparesis with left upper extremity 3/5 and left lower extremity 4/5 strength with drift on both..  Significant weakness of left grip and intrinsic hand muscles.  Right side is normal.  COORDINATION: There is significant dysmetria of the left upper extremity and left lower extremity.  SENSATION: There is reduced sensation to temperature light touch along the left upper extremity and left leg.  ASSESSMENT/PLAN [name] is a 65 y.o. female with history of poorly controlled diabetes and hypertension, hyperlipidemia, obesity, anxiety, depression, presenting to [facility] with acute dizziness left-sided weakness, left-sided numbness, and possible visual deficits. She received IV tPA  via telemedicine at [place] [facility] 04/18/2021 and was found to have occlusion of the right P2/PCA and was taken to IR for thrombectomy. MT performed with combined stentriever and aspiration. Total of 1 pass with complete recanalization (TICI3). No hemorrhagic complication on flat panel head CT. [name].  Stroke: acute nonhemorrhagic lateral aspect of  the right thalamus. Acute nonhemorrhagic infarcts involving the more superior and medial right thalamus, the right occipital lobe, the left side of the splenium of the corpus callosum, and the right cerebellum - embolic - etiology unknown.  Resultant dysmetria involving the left side and left-sided numbness.  Code Stroke CT Head - Normal CT appearance of the brain. ASPECTS is 10/10.   CT head - not ordered  MRI head - 04/18/21 - Small acute infarct of the anterolateral right thalamus adjacent to the posterior limb of the right internal capsule.   MRI head - 04/19/21 - Stable acute nonhemorrhagic infarct involving the lateral aspect of the right thalamus. Additional acute nonhemorrhagic infarcts involving the more superior and medial right thalamus, the right occipital lobe, the left side of the splenium of the corpus callosum, and the right cerebellum. Stable white matter disease. This likely reflects the sequela of chronic microvascular ischemia.    MRA head - Right PCA mid P1 segment occlusion. Loss of normal flow related enhancement in the right vertebral artery V4 segment.  CTA H&N - 50 % stenosis of the proximal right internal carotid artery secondary to mixed density atherosclerosis.   CT Perfusion - Occlusion of the right posterior cerebral artery P1 segment with 13 mL area of ischemic penumbra in the right occipital lobe.   Carotid Doppler - CTA neck ordered - carotid dopplers not indicated.  2D Echo - EF 70 to 75%. No cardiac source of emboli identified.   TEE and LOOP   pending  [employer] Virus 2 - negative  LDL - 105  HgbA1c - 9.3  UDS - not ordered  VTE prophylaxis - SCDs Diet: Carb restricted  No antithrombotic or antiplatelet prior to admission, now on  aspirin 81 mg daily and Plavix 75 mg daily x 3 weeks   Patient will be counseled to be compliant with her antithrombotic medications  Ongoing aggressive stroke risk factor management  Therapy recommendations:  CIR  recommended -  accepted with insurance pending  Disposition:  CIR pending  Hypertension  Home BP meds: Cozaar and HCTZ  Current BP meds: On home cozaar, normodyne prn  Stable, hypertensive   Permissive hypertension bp less than 180 . Long-term BP goal normotensive  Hyperlipidemia  Home Lipid lowering medication: none  LDL 105, goal < 70  Current lipid lowering medication: will add Lipitor 40 mg daily  Continue statin at discharge  Diabetes  Home diabetic meds: Actos 45, Januvia 100, Glucotrol 5 mg,  Metformin 1000mg   bid  Current diabetic meds: insulin  HgbA1c 9.3, goal < 7.0 Recent Labs    04/23/21 1613 04/23/21 2107 04/24/21 0617  GLUCAP 215* 296* 249*  Diabetes coordinator consult appreciated, will add Levimir 10units BID, Novolog meal time coverage of 3 units, SSI to be changed to  moderate scale  Other Stroke Risk Factors  Advanced age  Former cigarette smoker - quit  ETOH use, advised to drink no more than 1 alcoholic beverage per day.  Obesity, recommend weight loss, diet and exercise as appropriate   Other Active Problems, Findings, Recommendations and/or Plan  Code status - Full Code  Aortic Atherosclerosis (ICD10-I70.0).  CKD - stage  3a - creatinine - 1.09 Cough: robitussin, CBC sent, monitor    Hospital day # 5  Continue ongoing therapies.  TEE and loop recorder pending today.     Continue aspirin and Plavix for 3 weeks followed by aspirin alone.  Transfer to rehab when bed available and after insurance approval.  Greater than 50% time during this 25-minute visit was spent on counseling and coordination of care and discussion with care team..    Antony Contras, [place] Pager: [phone] 04/24/2021 10:25 AM  To contact Stroke Continuity provider, please refer to [email or web address] After hours, contact General Neurology

## 2021-04-24 NOTE — Consult Note (Signed)
Cardiology Consultation:   Patient ID: [name] MRN: [id number]; DOB: [birth year]  Admit date: 04/19/2021 Date of Consult: 04/24/2021  PCP:  Mar Daring, PA-C   [facility] Providers Cardiologist:  Werner Lean, MD     New Patient Click here to update MD or APP on Care Team, Refresh:1}     Patient Profile:   [name] is a 65 y.o. female with a hx of HTN & DM, HLD and former smoking who is being seen 04/24/2021 for the evaluation of pre-surgical risk assessment at the request of Dr. Leonie Man.  History of Present Illness:   [name] had been feeling fine prior to her 04/18/21 stroke.  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 Initial evaluation 04/18/21 with sudden onset weakness dizziness, and numbness consistent with stroke. Wound to have embolic stroke pattern rPCA and mP1 occlusion s/p IR intervention 04/19/21.  As part of work up was planned to have TEE and ILR.  Aortic Mass found on TEE.  Cardiology consults in conjunction with CT surgery for ischemic work up prior to intervention.   Has had no chest pain, chest pressure, chest tightness, chest stinging.  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  No shortness of breath, DOE.  No PND or orthopnea.  No bendopnea, weight gain, leg swelling , or abdominal swelling.  No syncope or near syncope . Notes  no palpitations or funny heart beats.    Notes distant smoking history.     Past Medical History:  Diagnosis Date  . Anxiety   . Arthritis   . Cataract    left  . Depression   . Diabetes mellitus without complication ([place])   . Environmental and seasonal allergies   . GERD (gastroesophageal reflux disease)   . Hyperlipidemia   . Hypertension   . Joint pain    as reported by patient  . Urinary incontinence    as stated by patient    Past Surgical History:  Procedure Laterality  Date  . BREAST EXCISIONAL BIOPSY Right 1999   neg  . BREAST LUMPECTOMY Right 2005   as reported by patient  . CATARACT EXTRACTION  January 2013  . CATARACT EXTRACTION W/PHACO Left 12/02/2016   Procedure: CATARACT EXTRACTION PHACO AND INTRAOCULAR LENS PLACEMENT (IOC);  Surgeon: Estill Cotta, MD;  Location: [facility] ORS;  Service: Ophthalmology;  Laterality: Left;  [place] 2:14 AP% 28.4 CDE 59.73 Fluid pack lot # [id number]  . DIAGNOSTIC LAPAROSCOPY    . IR CT HEAD LTD  04/19/2021  . IR PERCUTANEOUS ART THROMBECTOMY/INFUSION INTRACRANIAL INC DIAG ANGIO  04/19/2021      . IR PERCUTANEOUS ART THROMBECTOMY/INFUSION INTRACRANIAL INC DIAG ANGIO  04/19/2021  . IR US GUIDE VASC ACCESS LEFT  04/19/2021  . RADIOLOGY WITH ANESTHESIA N/A 04/19/2021   Procedure: IR WITH ANESTHESIA;  Surgeon: Luanne Bras, MD;  Location: [place];  Service: Radiology;  Laterality: N/A;  . TOTAL HIP ARTHROPLASTY Right 04/04/2018   Procedure: TOTAL HIP ARTHROPLASTY;  Surgeon: Dereck Leep, MD;  Location: [facility] ORS;  Service: Orthopedics;  Laterality: Right;     Home Medications:  Prior to Admission medications   Medication Sig Start Date End Date Taking? Authorizing Provider  glipiZIDE (GLUCOTROL XL) 5 MG 24 hr tablet Take 5 mg by mouth daily with breakfast.   Yes [provider]  hydrochlorothiazide (HYDRODIURIL) 25 MG tablet Take  25 mg by mouth daily.   Yes [provider]  losartan (COZAAR) 25 MG tablet Take 25 mg by mouth 2 (two) times daily.   Yes [provider]  metFORMIN (GLUCOPHAGE) 1000 MG tablet Take 1,000 mg by mouth 2 (two) times daily with a meal.   Yes [provider]  Multiple Vitamin (MULTIVITAMIN) tablet Take 1 tablet by mouth daily.   Yes [provider]  pioglitazone (ACTOS) 45 MG tablet Take 45 mg by mouth daily.   Yes [provider]  rOPINIRole (REQUIP) 1 MG tablet Take 1 mg by mouth at bedtime.   Yes [provider]  sertraline (ZOLOFT) 100 MG  tablet Take 100 mg by mouth daily.   Yes [provider]  simvastatin (ZOCOR) 20 MG tablet Take 20 mg by mouth at bedtime.   Yes [provider]  sitaGLIPtin (JANUVIA) 100 MG tablet Take 100 mg by mouth daily.   Yes [provider]    Inpatient Medications: Scheduled Meds: . aspirin  81 mg Oral Daily  . atorvastatin  40 mg Oral Daily  . Chlorhexidine Gluconate Cloth  6 each Topical Daily  . clopidogrel  75 mg Oral Q breakfast  . insulin aspart  0-15 Units Subcutaneous TID WC  . insulin aspart  0-5 Units Subcutaneous QHS  . insulin aspart  5 Units Subcutaneous TID WC  . insulin detemir  12 Units Subcutaneous BID  . labetalol  20 mg Intravenous Once  . losartan  25 mg Oral BID  . pantoprazole  40 mg Oral QHS   Continuous Infusions: . sodium chloride 20 mL/hr at 04/24/21 0641   PRN Meds: acetaminophen **OR** acetaminophen (TYLENOL) oral liquid 160 mg/5 mL **OR** acetaminophen, guaiFENesin, labetalol, metoprolol tartrate, senna-docusate  Allergies:    Allergies  Allergen Reactions  . Jardiance [Empagliflozin] Itching    Recurrent mycotic infections  . Penicillins Rash and Other (See Comments)    Has patient had a PCN reaction causing immediate rash, facial/tongue/throat swelling, SOB or lightheadedness with hypotension: No Has patient had a PCN reaction causing severe rash involving mucus membranes or skin necrosis: No Has patient had a PCN reaction that required hospitalization No Has patient had a PCN reaction occurring within the last 10 years: No If all of the above answers are "NO", then may proceed with Cephalosporin use.     Social History:   Social History   Socioeconomic History  . Marital status: Widowed    Spouse name: Not on file  . Number of children: Not on file  . Years of education: Not on file  . Highest education level: Not on file  Occupational History    Employer: [employer]  Tobacco Use  . Smoking status: Former Smoker     Packs/day: 1.50    Years: 25.00    Pack years: 37.50    Types: Cigarettes    Quit date: 03/24/2007    Years since quitting: 14.0  . Smokeless tobacco: Never Used  Vaping Use  . Vaping Use: Never used  Substance and Sexual Activity  . Alcohol use: Yes    Alcohol/week: 0.0 standard drinks    Comment: 1- every 3-4 months  . Drug use: No  . Sexual activity: Not on file  Other Topics Concern  . Not on file  Social History Narrative  . Not on file   Social Determinants of Health   Financial Resource Strain: Not on file  Food Insecurity: Not on file  Transportation Needs: Not  on file  Physical Activity: Not on file  Stress: Not on file  Social Connections: Not on file  Intimate Partner Violence: Not on file    Family History:    Family History  Problem Relation Age of Onset  . Bone cancer Brother   . Aneurysm Mother        brain  . Diabetes Father   . CAD Father   . Heart failure Father   . Colon cancer Father   . Cancer Father   . Alzheimer's disease Paternal Grandmother   . Dementia Paternal Grandmother   . Mental illness Paternal Grandfather   . Healthy Daughter   . Healthy Son   . Breast cancer Neg Hx     Unclear history of MI in father. Grandson has congenital aortic stenosis.  ROS:  Please see the history of present illness.  Notes outpatient hypoglycemia and hyperglycemia All other ROS reviewed and negative.     Physical Exam/Data:   Vitals:   04/24/21 1220 04/24/21 1335 04/24/21 1345 04/24/21 1355  BP: (!) 150/56 (!) 98/46 (!) 118/51 (!) 137/56  Pulse: 75 74 77 76  Resp: 19 (!) 22 20 19   Temp: (!) 97.5 F (36.4 C) 98 F (36.7 C)    TempSrc: Temporal Temporal    SpO2: 95% 94% 97% 94%  Weight: 99 kg     Height: 5\' 8"  (1.727 m)       Intake/Output Summary (Last 24 hours) at 04/24/2021 1644 Last data filed at 04/24/2021 1340 Gross per 24 hour  Intake 563.13 ml  Output 3470 ml  Net -2906.87 ml   Last 3 Weights 04/24/2021 04/20/2021 04/18/2021   Weight (lbs) 218 lb 4.1 oz 218 lb 4.1 oz 220 lb  Weight (kg) 99 kg 99 kg 99.791 kg     Body mass index is 33.19 kg/m.  General:  Well nourished, well developed, in no acute distress HEENT: normal Lymph: no adenopathy Neck: no JVD Endocrine:  No thryomegaly Vascular: No carotid bruits; FA pulses 2+ bilaterally without bruits  Cardiac:  normal S1, S2; RRR; no murmur Lungs:  clear to auscultation bilaterally, no wheezing, rhonchi or rales  Abd: soft, nontender, no hepatomegaly  Ext: no edema LUE III/V, LLI IV/V Musculoskeletal:  No deformities, normal strength RUE and RLE Skin: warm and dry  Neuro:  Decreased sensation LUE and LLE Psych:  Appropriate affect   EKG:  The EKG was personally reviewed and demonstrates:  SR 1st HB anterior infarct pattern rate 82 from 04/18/21 Telemetry:  Telemetry was personally reviewed and demonstrates:  SR rate 60s-80s  Relevant CV Studies:  Transthoracic Echocardiogram: Date: 04/19/21 Results:  Left ventricular ejection fraction, by estimation, is 70 to 75%. The  left ventricle has hyperdynamic function. The left ventricle has no  regional wall motion abnormalities. Left ventricular diastolic parameters  are consistent with Grade I diastolic  dysfunction (impaired relaxation). Elevated left atrial pressure.  2. Right ventricular systolic function is normal. The right ventricular  size is normal. Tricuspid regurgitation signal is inadequate for assessing  PA pressure.  3. Left atrial size was mildly dilated.  4. The mitral valve is normal in structure. Mild mitral valve  regurgitation. No evidence of mitral stenosis.  5. The aortic valve is normal in structure. Aortic valve regurgitation is  not visualized. No aortic stenosis is present.  6. The inferior vena cava is normal in size with greater than 50%  respiratory variability, suggesting right atrial pressure of 3 mmHg.   Transesophageal  Echocardiogram: Date: 04/24/21 Results: No LAA  Thrombus Lipomatous interatrial septum There is a 12 X 8 mm well circumscribed mass on the RCC that appears to have its attachment on the aortic side without concomitant AI  Laboratory Data:  High Sensitivity Troponin:  No results for input(s): TROPONINIHS in the last 720 hours.   Chemistry Recent Labs  Lab 04/18/21 1821 04/20/21 0943 04/21/21 0834  NA 133* 132* 136  K 4.6 3.9 4.0  CL 96* 99 101  CO2 27 24 27   GLUCOSE 272* 256* 197*  BUN 25* 10 13  CREATININE 1.09* 0.89 0.88  CALCIUM 9.0 8.8* 9.2  GFRNONAA 56* >60 >60  ANIONGAP 10 9 8     Recent Labs  Lab 04/18/21 1821  PROT 8.0  ALBUMIN 4.0  AST 23  ALT 16  ALKPHOS 45  BILITOT 0.6   Hematology Recent Labs  Lab 04/18/21 1821 04/24/21 1059  WBC 9.0 8.0  RBC 4.52 4.35  HGB 12.9 12.6  HCT 38.0 37.4  MCV 84.1 86.0  MCH 28.5 29.0  MCHC 33.9 33.7  RDW 12.9 13.2  PLT 259 248   BNPNo results for input(s): BNP, PROBNP in the last 168 hours.  DDimer No results for input(s): DDIMER in the last 168 hours.   Radiology/Studies:  No results found.   Assessment and Plan:   PFE with associated embolic stroke pending surgery HTN and DM, with HLD - continue ASA & plavix per stroke team, statin, and losartan - Despite an international contrast crisis, CCTA would be the best peri-operative procedure to assess for concomitant coronary disease and spare patient multiple surgeries - Ordering CCTA and metoprolol tartrate 100 mg PO PNR 90 minutes prior to CCTA.  If CCTA cannot be done; given no present plan for time sensitive scan can be delayed as long as it is done prior to surgical planning.  Discussed with patient, daughter, and nursing; discussed with CT surgery PA and imaging navigator team   Risk Assessment/Risk Scores:  {      For questions or updates, please contact [place] Please consult [email or web address] for contact info under    Signed, Werner Lean, MD  04/24/2021 4:44 PM

## 2021-04-24 NOTE — Interval H&P Note (Signed)
History and Physical Interval Note:  04/24/2021 10:41 AM  [name]  has presented today for surgery, with the diagnosis of stroke.  The various methods of treatment have been discussed with the patient and family. After consideration of risks, benefits and other options for treatment, the patient has consented to  Procedure(s): LOOP RECORDER INSERTION (N/A) as a surgical intervention.  The patient's history has been reviewed, patient examined, no change in status, stable for surgery.  I have reviewed the patient's chart and labs.  Questions were answered to the patient's satisfaction.     Thompson Grayer

## 2021-04-24 NOTE — Anesthesia Preprocedure Evaluation (Signed)
Anesthesia Evaluation  Patient identified by MRN, date of birth, ID band Patient awake    Reviewed: Allergy & Precautions, NPO status , Patient's Chart, lab work & pertinent test results  Airway Mallampati: II  TM Distance: >3 FB Neck ROM: Full    Dental  (+) Teeth Intact, Dental Advisory Given   Pulmonary former smoker,    Pulmonary exam normal breath sounds clear to auscultation       Cardiovascular hypertension, Pt. on medications Normal cardiovascular exam Rhythm:Regular Rate:Normal     Neuro/Psych PSYCHIATRIC DISORDERS Anxiety Depression CVA, Residual Symptoms    GI/Hepatic Neg liver ROS, GERD  ,  Endo/Other  diabetes, Type 2, Oral Hypoglycemic AgentsObesity   Renal/GU negative Renal ROS     Musculoskeletal  (+) Arthritis ,   Abdominal   Peds  Hematology negative hematology ROS (+)   Anesthesia Other Findings Day of surgery medications reviewed with the patient.  Reproductive/Obstetrics                             Anesthesia Physical Anesthesia Plan  ASA: II  Anesthesia Plan: MAC   Post-op Pain Management:    Induction: Intravenous  PONV Risk Score and Plan: 2 and Propofol infusion and Treatment may vary due to age or medical condition  Airway Management Planned: Natural Airway and Nasal Cannula  Additional Equipment:   Intra-op Plan:   Post-operative Plan:   Informed Consent: I have reviewed the patients History and Physical, chart, labs and discussed the procedure including the risks, benefits and alternatives for the proposed anesthesia with the patient or authorized representative who has indicated his/her understanding and acceptance.     Dental advisory given  Plan Discussed with: CRNA  Anesthesia Plan Comments:         Anesthesia Quick Evaluation

## 2021-04-24 NOTE — H&P (View-Only) (Signed)
ELECTROPHYSIOLOGY CONSULT NOTE  Patient ID: [name] MRN: [id number], DOB/AGE: [birth year]   Admit date: 04/19/2021 Date of Consult: 04/24/2021  Primary Physician: Mar Daring, PA-C Primary Cardiologist: none Reason for Consultation: Cryptogenic stroke; recommendations regarding Implantable Loop Recorder, requested by Dr. Leonie Man  History of Present Illness [name] was admitted on 04/19/2021 , initially presented to [facility] with acute dizziness, L sided weakness and visual changes, She received IV tPA  via telemedicine at [place] [facility]. 04/18/2021 and was found to have occlusion of the right P2/PCA and was taken to IR for thrombectomy. MT performed with combined stentriever and aspiration. Total of 1 pass with complete recanalization (TICI3 .    PMHx includes: DM (poorlly controlled), HTN, HLD, obesity, anxiety/depression Neurology notes: acute nonhemorrhagic lateral aspect of the right thalamus. Acute nonhemorrhagic infarcts involving the more superior and medial right thalamus, the right occipital lobe, the left side of the splenium of the corpus callosum, and the right cerebellum - embolic - etiology unknown..  she has undergone workup for stroke including echocardiogram and carotid angio.  The patient has been monitored on telemetry which has demonstrated sinus rhythm with no arrhythmias.  Inpatient stroke work-up is to be completed with a TEE today   Echocardiogram this admission demonstrated  IMPRESSIONS  1. Left ventricular ejection fraction, by estimation, is 70 to 75%. The  left ventricle has hyperdynamic function. The left ventricle has no  regional wall motion abnormalities. Left ventricular diastolic parameters  are consistent with Grade I diastolic  dysfunction (impaired relaxation). Elevated left atrial pressure.  2. Right ventricular systolic function is normal. The right ventricular  size is normal. Tricuspid regurgitation signal is inadequate for assessing   PA pressure.  3. Left atrial size was mildly dilated.  4. The mitral valve is normal in structure. Mild mitral valve  regurgitation. No evidence of mitral stenosis.  5. The aortic valve is normal in structure. Aortic valve regurgitation is  not visualized. No aortic stenosis is present.  6. The inferior vena cava is normal in size with greater than 50%  respiratory variability, suggesting right atrial pressure of 3 mmHg.    Lab work is reviewed.   Prior to admission, the patient denies chest pain, shortness of breath, dizziness, palpitations, or syncope.  They are recovering from their stroke with plans to CIR, at discharge. She has been accepted to CIR pending insurance      Past Medical History:  Diagnosis Date  . Anxiety   . Arthritis   . Cataract    left  . Depression   . Diabetes mellitus without complication ([place])   . Environmental and seasonal allergies   . GERD (gastroesophageal reflux disease)   . Hyperlipidemia   . Hypertension   . Joint pain    as reported by patient  . Urinary incontinence    as stated by patient     Surgical History:  Past Surgical History:  Procedure Laterality Date  . BREAST EXCISIONAL BIOPSY Right 1999   neg  . BREAST LUMPECTOMY Right 2005   as reported by patient  . CATARACT EXTRACTION  January 2013  . CATARACT EXTRACTION W/PHACO Left 12/02/2016   Procedure: CATARACT EXTRACTION PHACO AND INTRAOCULAR LENS PLACEMENT (IOC);  Surgeon: Estill Cotta, MD;  Location: [facility] ORS;  Service: Ophthalmology;  Laterality: Left;  [place] 2:14 AP% 28.4 CDE 59.73 Fluid pack lot # [id number]  . DIAGNOSTIC LAPAROSCOPY    . IR CT HEAD LTD  04/19/2021  . IR  PERCUTANEOUS ART THROMBECTOMY/INFUSION INTRACRANIAL INC DIAG ANGIO  04/19/2021      . IR PERCUTANEOUS ART THROMBECTOMY/INFUSION INTRACRANIAL INC DIAG ANGIO  04/19/2021  . IR US GUIDE VASC ACCESS LEFT  04/19/2021  . RADIOLOGY WITH ANESTHESIA N/A 04/19/2021   Procedure: IR WITH ANESTHESIA;  Surgeon:  Luanne Bras, MD;  Location: [place];  Service: Radiology;  Laterality: N/A;  . TOTAL HIP ARTHROPLASTY Right 04/04/2018   Procedure: TOTAL HIP ARTHROPLASTY;  Surgeon: Dereck Leep, MD;  Location: [facility] ORS;  Service: Orthopedics;  Laterality: Right;     Medications Prior to Admission  Medication Sig Dispense Refill Last Dose  . glipiZIDE (GLUCOTROL XL) 5 MG 24 hr tablet Take 5 mg by mouth daily with breakfast.   04/18/2021  . hydrochlorothiazide (HYDRODIURIL) 25 MG tablet Take 25 mg by mouth daily.   04/18/2021  . losartan (COZAAR) 25 MG tablet Take 25 mg by mouth 2 (two) times daily.   04/18/2021  . metFORMIN (GLUCOPHAGE) 1000 MG tablet Take 1,000 mg by mouth 2 (two) times daily with a meal.   04/18/2021  . Multiple Vitamin (MULTIVITAMIN) tablet Take 1 tablet by mouth daily.   04/18/2021  . pioglitazone (ACTOS) 45 MG tablet Take 45 mg by mouth daily.   04/18/2021  . rOPINIRole (REQUIP) 1 MG tablet Take 1 mg by mouth at bedtime.   04/17/2021  . sertraline (ZOLOFT) 100 MG tablet Take 100 mg by mouth daily.   04/18/2021  . simvastatin (ZOCOR) 20 MG tablet Take 20 mg by mouth at bedtime.   04/17/2021  . sitaGLIPtin (JANUVIA) 100 MG tablet Take 100 mg by mouth daily.   04/18/2021    Inpatient Medications:  . aspirin  81 mg Oral Daily  . atorvastatin  40 mg Oral Daily  . Chlorhexidine Gluconate Cloth  6 each Topical Daily  . clopidogrel  75 mg Oral Q breakfast  . insulin aspart  0-15 Units Subcutaneous TID WC  . insulin aspart  0-5 Units Subcutaneous QHS  . insulin aspart  5 Units Subcutaneous TID WC  . insulin detemir  12 Units Subcutaneous BID  . labetalol  20 mg Intravenous Once  . losartan  25 mg Oral BID  . pantoprazole  40 mg Oral QHS    Allergies:  Allergies  Allergen Reactions  . Jardiance [Empagliflozin] Itching    Recurrent mycotic infections  . Penicillins Rash and Other (See Comments)    Has patient had a PCN reaction causing immediate rash, facial/tongue/throat swelling, SOB or  lightheadedness with hypotension: No Has patient had a PCN reaction causing severe rash involving mucus membranes or skin necrosis: No Has patient had a PCN reaction that required hospitalization No Has patient had a PCN reaction occurring within the last 10 years: No If all of the above answers are "NO", then may proceed with Cephalosporin use.     Social History   Socioeconomic History  . Marital status: Widowed    Spouse name: Not on file  . Number of children: Not on file  . Years of education: Not on file  . Highest education level: Not on file  Occupational History    Employer: [employer]  Tobacco Use  . Smoking status: Former Smoker    Packs/day: 1.50    Years: 25.00    Pack years: 37.50    Types: Cigarettes    Quit date: 03/24/2007    Years since quitting: 14.0  . Smokeless tobacco: Never Used  Vaping Use  . Vaping Use: Never  used  Substance and Sexual Activity  . Alcohol use: Yes    Alcohol/week: 0.0 standard drinks    Comment: 1- every 3-4 months  . Drug use: No  . Sexual activity: Not on file  Other Topics Concern  . Not on file  Social History Narrative  . Not on file   Social Determinants of Health   Financial Resource Strain: Not on file  Food Insecurity: Not on file  Transportation Needs: Not on file  Physical Activity: Not on file  Stress: Not on file  Social Connections: Not on file  Intimate Partner Violence: Not on file     Family History  Problem Relation Age of Onset  . Bone cancer Brother   . Aneurysm Mother        brain  . Diabetes Father   . CAD Father   . Heart failure Father   . Colon cancer Father   . Cancer Father   . Alzheimer's disease Paternal Grandmother   . Dementia Paternal Grandmother   . Mental illness Paternal Grandfather   . Healthy Daughter   . Healthy Son   . Breast cancer Neg Hx       Review of Systems: All other systems reviewed and are otherwise negative except as noted above.  Physical Exam: Vitals:    04/23/21 1943 04/23/21 2332 04/24/21 0013 04/24/21 0316  BP: (!) 184/77 (!) 183/81 (!) 162/77 (!) 146/82  Pulse: 70 73 73 77  Resp: 17 17  17   Temp: 97.9 F (36.6 C) 98.2 F (36.8 C)  98.2 F (36.8 C)  TempSrc: Oral Oral    SpO2: 97% 93% 93% 95%  Weight:      Height:        GEN- The patient is well appearing, alert and oriented x 3 today.   Head- normocephalic, atraumatic Eyes-  Sclera clear, conjunctiva pink Ears- hearing intact Oropharynx- clear Neck- supple Lungs- CTA b/l, normal work of breathing Heart- RRR, no murmurs, rubs or gallops  GI- soft, NT, ND Extremities- no clubbing, cyanosis, or edema MS- no significant deformity or atrophy Skin- no rash or lesion Psych- euthymic mood, full affect   Labs:   Lab Results  Component Value Date   WBC 9.0 04/18/2021   HGB 12.9 04/18/2021   HCT 38.0 04/18/2021   MCV 84.1 04/18/2021   PLT 259 04/18/2021    Recent Labs  Lab 04/18/21 1821 04/20/21 0943 04/21/21 0834  NA 133*   < > 136  K 4.6   < > 4.0  CL 96*   < > 101  CO2 27   < > 27  BUN 25*   < > 13  CREATININE 1.09*   < > 0.88  CALCIUM 9.0   < > 9.2  PROT 8.0  --   --   BILITOT 0.6  --   --   ALKPHOS 45  --   --   ALT 16  --   --   AST 23  --   --   GLUCOSE 272*   < > 197*   < > = values in this interval not displayed.   Lab Results  Component Value Date   CKTOTAL 120 09/06/2013   TROPONINI <0.03 02/25/2016   Lab Results  Component Value Date   CHOL 195 04/19/2021   CHOL 233 (H) 09/20/2020   CHOL 232 (H) 08/24/2019   Lab Results  Component Value Date   HDL 46 04/19/2021   HDL 47 09/20/2020  HDL 38 (L) 08/24/2019   Lab Results  Component Value Date   LDLCALC 105 (H) 04/19/2021   LDLCALC 152 (H) 09/20/2020   LDLCALC 139 (H) 08/24/2019   Lab Results  Component Value Date   TRIG 221 (H) 04/19/2021   TRIG 187 (H) 09/20/2020   TRIG 302 (H) 08/24/2019   Lab Results  Component Value Date   CHOLHDL 4.2 04/19/2021   CHOLHDL 6.1 (H)  08/24/2019   CHOLHDL 3.3 11/13/2016   No results found for: LDLDIRECT  No results found for: DDIMER   Radiology/Studies:   MR ANGIO HEAD WO CONTRAST Result Date: 04/18/2021 CLINICAL DATA:  Possible stroke.  Left arm weakness and numbness EXAM: MRI HEAD WITHOUT CONTRAST MRA HEAD WITHOUT CONTRAST TECHNIQUE: Multiplanar, multiecho pulse sequences of the brain and surrounding structures were obtained without intravenous contrast. Angiographic images of the head were obtained using MRA technique without contrast. COMPARISON:  None. FINDINGS: MRI HEAD FINDINGS Brain: Small acute infarct of the anterolateral right thalamus adjacent to the posterior limb of the right internal capsule. No other acute ischemia. No acute or chronic hemorrhage. Normal white matter signal, parenchymal volume and CSF spaces. The midline structures are normal. Vascular: Major flow voids are preserved. Skull and upper cervical spine: Normal calvarium and skull base. Visualized upper cervical spine and soft tissues are normal. Sinuses/Orbits:No paranasal sinus fluid levels or advanced mucosal thickening. No mastoid or middle ear effusion. Normal orbits. MRA HEAD FINDINGS POSTERIOR CIRCULATION: --Vertebral arteries: Loss of normal flow related enhancement within the V4 segment of the right vertebral artery. The area of enhancement at the most distal aspect of the V4 segment is likely due to retrograde flow across the vertebrobasilar junction. Normal left V4 segment. --Inferior cerebellar arteries: Normal. --Basilar artery: Normal. --Superior cerebellar arteries: Normal. --Posterior cerebral arteries: Right PCA mid P1 segment occlusion. Left PCA is normal. ANTERIOR CIRCULATION: --Intracranial internal carotid arteries: Normal. --Anterior cerebral arteries (ACA): Normal. --Middle cerebral arteries (MCA): Normal. ANATOMIC VARIANTS: None IMPRESSION: 1. Small acute infarct of the anterolateral right thalamus adjacent to the posterior limb of the  right internal capsule. 2. Right PCA mid P1 segment occlusion. Loss of normal flow related enhancement in the right vertebral artery V4 segment. These results were called by telephone at the time of interpretation on 04/18/2021 at 10:26 pm to provider [facility] , who verbally acknowledged these results. Electronically Signed   By: Ulyses Jarred M.D.   On: 04/18/2021 22:26    MR BRAIN WO CONTRAST Result Date: 04/19/2021 CLINICAL DATA:  Stroke, follow-up. Status post recanalization of right P2 occlusion. EXAM: MRI HEAD WITHOUT CONTRAST TECHNIQUE: Multiplanar, multiecho pulse sequences of the brain and surrounding structures were obtained without intravenous contrast. COMPARISON:  MR head 04/18/2021 FINDINGS: Brain: The diffusion-weighted images again demonstrate an acute nonhemorrhagic infarct within the lateral aspect of the right thalamus. Additional acute nonhemorrhagic infarct is now present within the more superomedial right thalamus. A punctate nonhemorrhagic infarct is present in the left thalamus. Punctate infarct noted in the right cerebral peduncle. Scattered areas of restricted diffusion are present in the medial right occipital lobe. Acute punctate nonhemorrhagic infarct is present in the left side of the splenium of the corpus callosum. A punctate acute infarct is suspected in the right cerebellum. T2 signal changes are associated with these areas. No acute hemorrhage is present. No significant mass effect is present. Periventricular white matter changes are otherwise stable. No significant extraaxial fluid collection is present. The brainstem and cerebellum are otherwise normal. Vascular: Flow  is present in the major intracranial arteries. Flow void better demonstrated in the proximal right PCA. Skull and upper cervical spine: The craniocervical junction is normal. Upper cervical spine is within normal limits. Marrow signal is unremarkable. Sinuses/Orbits: The paranasal sinuses and mastoid air  cells are clear. Bilateral lens replacements are noted. Globes and orbits are otherwise unremarkable. IMPRESSION: 1. Stable acute nonhemorrhagic infarct involving the lateral aspect of the right thalamus. 2. Additional acute nonhemorrhagic infarcts involving the more superior and medial right thalamus, the right occipital lobe, the left side of the splenium of the corpus callosum, and the right cerebellum. 3. Stable white matter disease. This likely reflects the sequela of chronic microvascular ischemia. Electronically Signed   By: San Morelle M.D.   On: 04/19/2021 17:32      IR CT Head Ltd Result Date: 04/21/2021 INDICATION: 65 year old woman, right-hand-dominant, with a past medical history significant for poorly controlled diabetes, hypertension, hyperlipidemia, obesity, right hip replacement, anxiety/depression, presenting with acute onset dizziness, left-sided weakness, left-sided numbness, NIHSS 5. Her last known well was at 6 p.m. on 04/18/2021. She received IV tPA at 7:10 p.m. on 04/18/2021. Initially, she was thought to have a lacunar infarct. However, subsequent MRI/MRA of the head showed a right P1/PCA occlusion. Subsequently, patient patient was found to have worsening neglect on the left side with possible visual field deficit. CT/CT angiogram of the head and confirmed a right PCA 1/PCA occlusion and CT perfusion showed a 13 mL penumbra in the right PCA territory. After long discussion held by Dr. Amie Portland, MD, patient and her son, decision 1 made to proceed with a diagnostic cerebral angiogram and mechanical thrombectomy. EXAM: FLUOROSCOPY GUIDED VASCULAR ACCESS DIAGNOSTIC CEREBRAL ANGIOGRAM MECHANICAL THROMBECTOMY FLAT PANEL HEAD CT COMPARISON:  CT/CT angiogram of the head and neck Apr 18, 2021 MEDICATIONS: Refer to anesthesia documentation. ANESTHESIA/SEDATION: The procedure was performed in the general anesthesia. CONTRAST:  40 mL of Omnipaque 240 mg/mL. FLUOROSCOPY TIME:   Fluoroscopy Time: 14 minutes 42 seconds (543 mGy). COMPLICATIONS: None immediate. TECHNIQUE: Informed written consent was obtained from the patient and her son after a thorough discussion of the procedural risks, benefits and alternatives. All questions were addressed. Maximal Sterile Barrier Technique was utilized including caps, mask, sterile gowns, sterile gloves, sterile drape, hand hygiene and skin antiseptic. A timeout was performed prior to the initiation of the procedure. Using the modified Seldinger technique and a micropuncture kit, access was gained to the distal left radial artery at the anatomical snuffbox and a 6 French sheath was placed. Slow intra arterial infusion of 735 mcg nitroglicerin diluted in patient's own blood was performed. No significant fluctuation in patient's blood pressure seen. Then, a left radial artery roadmap was obtained via sheath side port. Next, a benchmark guide catheter was navigated over a Berenstein 2 catheter and a 0.035 " Terumo Glidewire into the left subclavian artery under fluoroscopic guidance. Frontal and lateral angiograms of the neck were obtained. The catheter was then navigated over the wire into the distal V2 segment of the left vertebral artery. Frontal and lateral views of the head were obtained. FINDINGS: 1. Normal brachial artery branching pattern seen. No significant anatomical variation. The left radial artery caliber is adequate for vascular access. 2. Atherosclerotic changes of the left subclavian artery with approximately 60% stenosis proximal to the origin of the left vertebral artery. 3. Occlusion of the proximal right P2/PCA. PROCEDURE: Under biplane roadmap, a [place] aspiration catheter was navigated over a phenom 21 microcatheter and a  synchro support microguidewire into the basilar artery. The microcatheter was then navigated over the wire into the right P3/PCA segment. Then, a 5 x 37 mm embotrap stent retriever was deployed spanning the  right P1 and P2 segments. The device was allowed to intercalated with the clot for 4 minutes. The microcatheter was removed. The aspiration catheter was advanced to the level of occlusion and connected to a penumbra aspiration pump. The thrombectomy device and aspiration catheter were removed under constant aspiration. Right vertebral artery angiogram showed recanalization of the right PCA with slow flow at the P4 segment (TICI 2C). Flat panel CT of the head was obtained and post processed in a separate workstation with concurrent attending physician supervision. Selected images were sent to PACS. No evidence of hemorrhagic complication noted. The catheter was subsequently withdrawn. An inflatable band was placed and inflated over the left hand access site. The vascular sheath was withdrawn and the band was slowly deflated until brisk flow was noted through the arteriotomy site. At this point, the band was reinflated with additional 2 cc of air to obtain patent hemostasis. IMPRESSION: Successful mechanical thrombectomy with combined stent retriever and aspiration for treatment of a right P2/PCA occlusion with complete recanalization (TICI2C). PLAN: 1. Deflation of radial pain per protocol. 2. Transfer to ICU for further care. 3. Goal SBP 120-140 mg/Hg. Electronically Signed   By: Pedro Earls M.D.   On: 04/21/2021 16:02     CT HEAD CODE STROKE WO CONTRAST Result Date: 04/18/2021 CLINICAL DATA:  Code stroke. New onset of dizziness and left-sided weakness. Last known well 45 minutes ago. EXAM: CT HEAD WITHOUT CONTRAST TECHNIQUE: Contiguous axial images were obtained from the base of the skull through the vertex without intravenous contrast. COMPARISON:  CT head without contrast 11/05/2010 FINDINGS: Brain: No acute infarct, hemorrhage, or mass lesion is present. No significant white matter lesions are present. The ventricles are of normal size. Basal ganglia are intact. Insular ribbon is normal. No  acute or focal cortical abnormality is present. No significant extraaxial fluid collection is present. Vascular: Atherosclerotic calcifications are present within the cavernous internal carotid arteries. No hyperdense vessel is present. Skull: Calvarium is intact. No focal lytic or blastic lesions are present. Sinuses/Orbits: The paranasal sinuses and mastoid air cells are clear. Bilateral lens replacements are noted. Globes and orbits are otherwise unremarkable. ASPECTS [facility] St[facility] Program Early CT Score) - Ganglionic level infarction (caudate, lentiform nuclei, internal capsule, insula, M1-M3 cortex): 7/7 - Supraganglionic infarction (M4-M6 cortex): 3/3 Total score (0-10 with 10 being normal): 10/10 IMPRESSION: 1. Normal CT appearance of the brain. 2. ASPECTS is 10/10. These results were called by telephone at the time of interpretation on 04/18/2021 at 6:50 pm to provider [facility] , who verbally acknowledged these results. Electronically Signed   By: San Morelle M.D.   On: 04/18/2021 18:50     CT ANGIO HEAD NECK W WO CM W PERF (CODE STROKE) Result Date: 04/18/2021 CLINICAL DATA:  Stroke follow-up.  Left-sided weakness EXAM: CT ANGIOGRAPHY HEAD AND NECK CT PERFUSION BRAIN TECHNIQUE: Multidetector CT imaging of the head and neck was performed using the standard protocol during bolus administration of intravenous contrast. Multiplanar CT image reconstructions and MIPs were obtained to evaluate the vascular anatomy. Carotid stenosis measurements (when applicable) are obtained utilizing NASCET criteria, using the distal internal carotid diameter as the denominator. Multiphase CT imaging of the brain was performed following IV bolus contrast injection. Subsequent parametric perfusion maps were calculated using RAPID software.  CONTRAST:  160m OMNIPAQUE IOHEXOL 350 MG/ML SOLN COMPARISON:  MRA head 04/18/2021 FINDINGS: CTA NECK FINDINGS SKELETON: There is no bony spinal canal stenosis. No lytic or  blastic lesion. OTHER NECK: Normal pharynx, larynx and major salivary glands. No cervical lymphadenopathy. Unremarkable thyroid gland. UPPER CHEST: No pneumothorax or pleural effusion. No nodules or masses. AORTIC ARCH: There CS calcific atherosclerosis of the aortic arch. There is no aneurysm, dissection or hemodynamically significant stenosis of the visualized portion of the aorta. Conventional 3 vessel aortic branching pattern. Mild narrowing of the proximal left subclavian artery. RIGHT CAROTID SYSTEM: No dissection, occlusion or aneurysm. There is mixed density atherosclerosis extending into the proximal ICA, resulting in 50% stenosis. LEFT CAROTID SYSTEM: No dissection, occlusion or aneurysm. Mild atherosclerotic calcification at the carotid bifurcation without hemodynamically significant stenosis. VERTEBRAL ARTERIES: Left dominant configuration. Both origins are clearly patent. There is no dissection, occlusion or flow-limiting stenosis to the skull base (V1-V3 segments). CTA HEAD FINDINGS POSTERIOR CIRCULATION: --Vertebral arteries: The right vertebral artery terminates in PICA. The vessel originating from the proximal right basilar is the anterior inferior cerebellar artery. --Inferior cerebellar arteries: Normal. --Basilar artery: Normal. --Superior cerebellar arteries: Normal. --Posterior cerebral arteries (PCA): Right P1 segment is occluded. Normal left PCA. ANTERIOR CIRCULATION: --Intracranial internal carotid arteries: Normal. --Anterior cerebral arteries (ACA): Normal. Both A1 segments are present. Patent anterior communicating artery (a-comm). --Middle cerebral arteries (MCA): Normal. VENOUS SINUSES: As permitted by contrast timing, patent. ANATOMIC VARIANTS: None Review of the MIP images confirms the above findings. CT Brain Perfusion Findings: CBF (<30%) Volume: 056mPerfusion (Tmax>6.0s) volume: 1364mismatch Volume: 9m43mfarction Location:No completed infarct visible via CT perfusion. On the  earlier MRI, there is a right thalamic infarct. The penumbra is located in the right occipital lobe. IMPRESSION: 1. Occlusion of the right posterior cerebral artery P1 segment with 13 mL area of ischemic penumbra in the right occipital lobe. 2. 50 % stenosis of the proximal right internal carotid artery secondary to mixed density atherosclerosis. Aortic Atherosclerosis (ICD10-I70.0). These results were communicated to Dr. AshiAmie Portland11:47 pm on 04/18/2021 by text page via the [facility] system. Electronically Signed   By: KeviUlyses Jarred.   On: 04/18/2021 23:48    12-lead ECG SR All prior EKG's in EPIC reviewed with no documented atrial fibrillation  Telemetry SR  Assessment and Plan:  1. Cryptogenic stroke The patient presents with cryptogenic stroke.  The patient has a TEE planned for today.  I spoke at length with the patient (and he son MichLegrand Comoa telephone) about monitoring for afib with either a 30 day event monitor or an implantable loop recorder.  Risks, benefits, and alteratives to implantable loop recorder were discussed with the patient today.   At this time, the patient and her sone  very clear in their decision to proceed with implantable loop recorder.   Wound care was reviewed with the patient (keep incision clean and dry for 3 days).  Wound check follow up will be scheduled for the patient.  Please call with questions.   ReneBaldwin Jamaica-C 04/24/2021   I have seen, examined the patient, and reviewed the above assessment and plan.  Changes to above are made where necessary.  On exam, RRR.  She presents with stroke.  I agree with Dr SethLenell Antucerns for cardioembolic source.  TEE is pending.  If TEE is unrevealing, we should proceed with ILR.  Risks and benefits to ILR were discussed in detail with the patient who  wishes to proceed.   Co Sign: Thompson Grayer, MD 04/24/2021 10:39 AM

## 2021-04-24 NOTE — Progress Notes (Signed)
Inpatient Rehab Admissions Coordinator:   [facility] for another update.  CIR case is still awaiting review, pending review of hospital case.    Shann Medal, PT, DPT Admissions Coordinator [phone] 04/24/21  12:12 PM

## 2021-04-24 NOTE — Transfer of Care (Signed)
Immediate Anesthesia Transfer of Care Note  Patient: [name]  Procedure(s) Performed: TRANSESOPHAGEAL ECHOCARDIOGRAM (TEE) (N/A ) BUBBLE STUDY  Patient Location: Endoscopy Unit  Anesthesia Type:MAC  Level of Consciousness: drowsy and patient cooperative  Airway & Oxygen Therapy: Patient Spontanous Breathing  Post-op Assessment: Report given to RN and Post -op Vital signs reviewed and stable  Post vital signs: Reviewed and stable  Last Vitals:  Vitals Value Taken Time  BP 98/46   Temp    Pulse 78   Resp 16   SpO2 97     Last Pain:  Vitals:   04/24/21 1220  TempSrc: Temporal  PainSc: 0-No pain      Patients Stated Pain Goal: 0 (81/27/51 7001)  Complications: No complications documented.

## 2021-04-24 NOTE — Progress Notes (Signed)
Pt continuous to have a cough which pt report contributing to her abd to hurt. Pt requested for pain medication but no yet time for prn, MD on-call notified and new order received for one time Vicodin. Will continue to closely monitor pt. Delia Heady RN

## 2021-04-24 NOTE — Anesthesia Postprocedure Evaluation (Signed)
Anesthesia Post Note  Patient: [name]  Procedure(s) Performed: TRANSESOPHAGEAL ECHOCARDIOGRAM (TEE) (N/A ) BUBBLE STUDY     Patient location during evaluation: Endoscopy Anesthesia Type: MAC Level of consciousness: awake and alert Pain management: pain level controlled Vital Signs Assessment: post-procedure vital signs reviewed and stable Respiratory status: spontaneous breathing, nonlabored ventilation and respiratory function stable Cardiovascular status: blood pressure returned to baseline and stable Postop Assessment: no apparent nausea or vomiting Anesthetic complications: no   No complications documented.  Last Vitals:  Vitals:   04/24/21 1345 04/24/21 1355  BP: (!) 118/51 (!) 137/56  Pulse: 77 76  Resp: 20 19  Temp:    SpO2: 97% 94%    Last Pain:  Vitals:   04/24/21 1355  TempSrc:   PainSc: 0-No pain                 Lynda Rainwater

## 2021-04-24 NOTE — Interval H&P Note (Signed)
History and Physical Interval Note:  04/24/2021 12:23 PM  [name]  has presented today for surgery, with the diagnosis of STROKE.  The various methods of treatment have been discussed with the patient and family. After consideration of risks, benefits and other options for treatment, the patient has consented to  Procedure(s): TRANSESOPHAGEAL ECHOCARDIOGRAM (TEE) (N/A) as a surgical intervention.  The patient's history has been reviewed, patient examined, no change in status, stable for surgery.  I have reviewed the patient's chart and labs.  Questions were answered to the patient's satisfaction.     [name]

## 2021-04-24 NOTE — CV Procedure (Signed)
    PROCEDURE NOTE:  Procedure:  Transesophageal echocardiogram Operator:  [name], MD Indications:  CVA Complications: None  During this procedure the patient is administered a total of Propofol 780 mg to achieve and maintain moderate conscious sedation.  The patient's heart rate, blood pressure, and oxygen saturation are monitored continuously during the procedure by anesthesia.   Results: Normal LV size and function Normal RV size and function with prominent RV moderator band Normal RA Mildly dilated LA and normal LAA with no thrombus.  Normal TV Normal PV Normal MV Trileaflet aortic valve with large well defined shimmering highly mobile mass on the aortic side of the valve.  3D images obtained to try to delineate the origin of the mass as difficult to discern whether this is originating from the valve leaflet or the aortic wall. 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 There is no significant AI.  Severe lipomatous hypertrophy of the interventricular septum with no evidence of shunt by colorflow dopper or agitated saline contrast. Normal thoracic and ascending aorta. Recommend CVTS consult.   The patient tolerated the procedure well and was transferred back to their room in stable condition.  Signed: [name], MD [facility]

## 2021-04-24 NOTE — Anesthesia Procedure Notes (Signed)
Procedure Name: MAC Date/Time: 04/24/2021 12:34 PM Performed by: Thelma Comp, CRNA Pre-anesthesia Checklist: Emergency Drugs available, Patient identified, Suction available, Patient being monitored and Timeout performed Patient Re-evaluated:Patient Re-evaluated prior to induction Oxygen Delivery Method: Nasal cannula Placement Confirmation: positive ETCO2 and breath sounds checked- equal and bilateral Dental Injury: Teeth and Oropharynx as per pre-operative assessment

## 2021-04-24 NOTE — Progress Notes (Signed)
Physical Therapy Treatment Patient Details Name: [name] MRN: [id number] DOB: [birth year] Today's Date: 04/24/2021    History of Present Illness [name] is a 65 y.o. female presenting 5/6 due to L-sided weakness and numbness, who has a thalamic stroke on the right along with the MRA showing a possible right P1 occlusion. Pt now s/p thrombectomy on 5/7 with complete recanalization. PMHx; of poorly controlled diabetes and hypertension, hyperlipidemia, obesity, anxiety, depression.    PT Comments    Patient continues to require heavy assistance for OOB mobility. +2 would be beneficial for safety and physical assist to progress beyond transfers. Patient required maxA for squat pivot transfer to recliner on R. Patient with active movement of L side but demos L neglect with leaving L UE in various positions and unaware. Continue to recommend comprehensive inpatient rehab (CIR) for post-acute therapy needs.     Follow Up Recommendations  CIR     Equipment Recommendations  None recommended by PT    Recommendations for Other Services       Precautions / Restrictions Precautions Precautions: Fall Restrictions Weight Bearing Restrictions: No    Mobility  Bed Mobility Overal bed mobility: Needs Assistance Bed Mobility: Supine to Sit     Supine to sit: Mod assist     General bed mobility comments: modA for trunk elevation, LE advancement, and repositioning hips at EOB. Patient sitting in soiled bed linens and pads    Transfers Overall transfer level: Needs assistance Equipment used: 1 person hand held assist Transfers: Squat Pivot Transfers;Sit to/from Stand Sit to Stand: Mod assist   Squat pivot transfers: Max assist     General transfer comment: modA for sit to stand x 2 for boost up and steady. Patient unable to advance R foot due to decreased weight shift to L. MaxA for squat pivot to recliner to the R. +2 would be beneficial for safety  Ambulation/Gait                  Stairs             Wheelchair Mobility    Modified Rankin (Stroke Patients Only) Modified Rankin (Stroke Patients Only) Pre-Morbid Rankin Score: No symptoms Modified Rankin: Severe disability     Balance Overall balance assessment: Needs assistance Sitting-balance support: Feet supported;Single extremity supported Sitting balance-Leahy Scale: Fair     Standing balance support: Single extremity supported Standing balance-Leahy Scale: Poor Standing balance comment: modA to maintain standing                            Cognition Arousal/Alertness: Awake/alert Behavior During Therapy: WFL for tasks assessed/performed;Impulsive Overall Cognitive Status: Impaired/Different from baseline Area of Impairment: Attention;Following commands;Safety/judgement;Awareness;Problem solving                   Current Attention Level: Sustained Memory: Decreased short-term memory Following Commands: Follows one step commands consistently Safety/Judgement: Decreased awareness of safety;Decreased awareness of deficits Awareness: Emergent Problem Solving: Difficulty sequencing;Requires verbal cues;Requires tactile cues General Comments: patient impulsive this session and demos decreased awareness of deficits with transfers. Cues for attending to task as patient is easily distracted      Exercises      General Comments        Pertinent Vitals/Pain Pain Assessment: No/denies pain    Home Living  Prior Function            PT Goals (current goals can now be found in the care plan section) Acute Rehab PT Goals Patient Stated Goal: return to independence PT Goal Formulation: With patient Time For Goal Achievement: 05/03/21 Potential to Achieve Goals: Good Progress towards PT goals: Progressing toward goals    Frequency    Min 4X/week      PT Plan Current plan remains appropriate    Co-evaluation               AM-PAC PT "6 Clicks" Mobility   Outcome Measure  Help needed turning from your back to your side while in a flat bed without using bedrails?: A Little Help needed moving from lying on your back to sitting on the side of a flat bed without using bedrails?: A Lot Help needed moving to and from a bed to a chair (including a wheelchair)?: A Lot Help needed standing up from a chair using your arms (e.g., wheelchair or bedside chair)?: A Lot Help needed to walk in hospital room?: A Lot Help needed climbing 3-5 steps with a railing? : Total 6 Click Score: 12    End of Session Equipment Utilized During Treatment: Gait belt Activity Tolerance: Patient tolerated treatment well Patient left: in chair;with call bell/phone within reach;with chair alarm set;with family/visitor present Nurse Communication: Mobility status;Other (comment) (Need for purewick and bath) PT Visit Diagnosis: Other abnormalities of gait and mobility (R26.89);Ataxic gait (R26.0)     Time: 8841-6606 PT Time Calculation (min) (ACUTE ONLY): 20 min  Charges:  $Therapeutic Activity: 8-22 mins                     Isais Klipfel A. Gilford Rile PT, DPT Acute Rehabilitation Services Pager [phone] Office [phone]    Linna Hoff 04/24/2021, 5:37 PM

## 2021-04-24 NOTE — Progress Notes (Signed)
Pt c/o LLE pain of 5 out of 10 scale and also requested for something to help her sleep. MD on-call paged and notified, new order received. Delia Heady RN

## 2021-04-24 NOTE — Progress Notes (Signed)
  Echocardiogram Echocardiogram Transesophageal has been performed.  Merrie Roof F 04/24/2021, 2:02 PM

## 2021-04-24 NOTE — Consult Note (Addendum)
[address]       [place] [address]             [phone]        [name] On[place] Medical Record #[id number] Date of Birth: [birth year]  Referring:Turner, Stroke MD Primary Care: Mar Daring, PA-C Primary Cardiologist:None  Chief Complaint: Stroke, Fibroelastoma Aortic Valve  History of Present Illness:      [name] is a 65 yo white female with history of HTN, DM poorly controlled, Hyperlipidemia, obesity, right hip replacement, anxiety/depression.  The patient works in [employer] at [employer].  On 5/6 while at work the patient developed acute onset dizziness, left side weakness, left sided numbness, and slurred speech.  Code stroke was initiated and she was taken to the ED at [facility].  At time of evaluation slurred speech had improved.  She continued to have prominent left sided hemiataxia.  CT scan was negative for acute process.  She was hypertensive with BP > 200. Neurology consult was obtained and she was treated with tPA.  Further workup with MRI was performed.  This showed small right thalamic infarct and right PCA mid P1 segment occlusion.  CTA of the head and neck was also obtained and showed a right posterior cerebral artery occlusion with ischemia to the right occipital lobe.  Due to this transfer to [facility] was initiated.  She underwent IR intervention on 5/7 with stentriever and aspiration.  She tolerated the procedure without difficulty.  MRI of brain remained stable.  TTE was obtained and showed an EF of 61-44% but no embolic source was identified.  It was recommended the patient have a TEE and a loop recorder placed.  TEE was performed on 5/12 and showed a large well defined mobile mass on the aortic side of the valve.  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  There was no significant aortic insufficiency.  EP consult was also obtained and the patient is agreeable to placement of a loop recorder.   Cardiothoracic surgery has been consulted for surgical removal of aortic valve mass.         Current Activity/ Functional Status: Patient was independent with mobility/ambulation, transfers, ADL's, IADL's.   Zubrod Score: At the time of surgery this patient's most appropriate activity status/level should be described as: []     0    Normal activity, no symptoms [x]     1    Restricted in physical strenuous activity but ambulatory, able to do out light work []     2    Ambulatory and capable of self care, unable to do work activities, up and about                 more than 50%  Of the time                            []     3    Only limited self care, in bed greater than 50% of waking hours []     4    Completely disabled, no self care, confined to bed or chair []     5    Moribund  Past Medical History:  Diagnosis Date  . Anxiety   . Arthritis   . Cataract    left  . Depression   . Diabetes mellitus without complication ([place])   . Environmental and seasonal allergies   .  GERD (gastroesophageal reflux disease)   . Hyperlipidemia   . Hypertension   . Joint pain    as reported by patient  . Urinary incontinence    as stated by patient    Past Surgical History:  Procedure Laterality Date  . BREAST EXCISIONAL BIOPSY Right 1999   neg  . BREAST LUMPECTOMY Right 2005   as reported by patient  . CATARACT EXTRACTION  January 2013  . CATARACT EXTRACTION W/PHACO Left 12/02/2016   Procedure: CATARACT EXTRACTION PHACO AND INTRAOCULAR LENS PLACEMENT (IOC);  Surgeon: Estill Cotta, MD;  Location: [facility] ORS;  Service: Ophthalmology;  Laterality: Left;  [place] 2:14 AP% 28.4 CDE 59.73 Fluid pack lot # [id number]  . DIAGNOSTIC LAPAROSCOPY    . IR CT HEAD LTD  04/19/2021  . IR PERCUTANEOUS ART THROMBECTOMY/INFUSION INTRACRANIAL INC DIAG ANGIO  04/19/2021      . IR PERCUTANEOUS ART THROMBECTOMY/INFUSION INTRACRANIAL INC DIAG ANGIO  04/19/2021  . IR US GUIDE VASC ACCESS LEFT  04/19/2021  . RADIOLOGY WITH  ANESTHESIA N/A 04/19/2021   Procedure: IR WITH ANESTHESIA;  Surgeon: Luanne Bras, MD;  Location: [place];  Service: Radiology;  Laterality: N/A;  . TOTAL HIP ARTHROPLASTY Right 04/04/2018   Procedure: TOTAL HIP ARTHROPLASTY;  Surgeon: Dereck Leep, MD;  Location: [facility] ORS;  Service: Orthopedics;  Laterality: Right;    Social History   Tobacco Use  Smoking Status Former Smoker  . Packs/day: 1.50  . Years: 25.00  . Pack years: 37.50  . Types: Cigarettes  . Quit date: 03/24/2007  . Years since quitting: 14.0  Smokeless Tobacco Never Used    Social History   Substance and Sexual Activity  Alcohol Use Yes  . Alcohol/week: 0.0 standard drinks   Comment: 1- every 3-4 months     Allergies  Allergen Reactions  . Jardiance [Empagliflozin] Itching    Recurrent mycotic infections  . Penicillins Rash and Other (See Comments)    Has patient had a PCN reaction causing immediate rash, facial/tongue/throat swelling, SOB or lightheadedness with hypotension: No Has patient had a PCN reaction causing severe rash involving mucus membranes or skin necrosis: No Has patient had a PCN reaction that required hospitalization No Has patient had a PCN reaction occurring within the last 10 years: No If all of the above answers are "NO", then may proceed with Cephalosporin use.     Current Facility-Administered Medications  Medication Dose Route Frequency Provider Last Rate Last Admin  . 0.9 %  sodium chloride infusion   Intravenous Continuous Almyra Deforest, [place] 20 mL/hr at 04/24/21 0641 Restarted at 04/24/21 1234  . acetaminophen (TYLENOL) tablet 650 mg  650 mg Oral Q4H PRN Amie Portland, MD   650 mg at 04/24/21 1035   Or  . acetaminophen (TYLENOL) 160 MG/5ML solution 650 mg  650 mg Per Tube Q4H PRN Amie Portland, MD       Or  . acetaminophen (TYLENOL) suppository 650 mg  650 mg Rectal Q4H PRN Amie Portland, MD      . aspirin chewable tablet 81 mg  81 mg Oral Daily Phillips Odor, MD   81 mg at  04/24/21 1036  . atorvastatin (LIPITOR) tablet 40 mg  40 mg Oral Daily Rinehuls, David L, PA-C   40 mg at 04/24/21 1035  . Chlorhexidine Gluconate Cloth 2 % PADS 6 each  6 each Topical Daily Amie Portland, MD   6 each at 04/24/21 1037  . clopidogrel (PLAVIX) tablet 75 mg  75 mg Oral Q  breakfast Phillips Odor, MD   75 mg at 04/24/21 1035  . guaiFENesin (ROBITUSSIN) 100 MG/5ML solution 100 mg  5 mL Oral Q4H PRN Bailey-Modzik, Delila A, NP   100 mg at 04/24/21 1034  . insulin aspart (novoLOG) injection 0-15 Units  0-15 Units Subcutaneous TID WC Bailey-Modzik, Delila A, NP   5 Units at 04/24/21 0640  . insulin aspart (novoLOG) injection 0-5 Units  0-5 Units Subcutaneous QHS Bailey-Modzik, Delila A, NP   3 Units at 04/23/21 2124  . insulin aspart (novoLOG) injection 5 Units  5 Units Subcutaneous TID WC Bailey-Modzik, Delila A, NP   5 Units at 04/23/21 1717  . insulin detemir (LEVEMIR) injection 12 Units  12 Units Subcutaneous BID Bailey-Modzik, Delila A, NP   12 Units at 04/24/21 1036  . labetalol (NORMODYNE) injection 10 mg  10 mg Intravenous Q2H PRN Phillips Odor, MD   10 mg at 04/23/21 2344  . labetalol (NORMODYNE) injection 20 mg  20 mg Intravenous Once Amie Portland, MD      . losartan (COZAAR) tablet 25 mg  25 mg Oral BID Bailey-Modzik, Delila A, NP   25 mg at 04/24/21 1036  . pantoprazole (PROTONIX) EC tablet 40 mg  40 mg Oral QHS Phillips Odor, MD   40 mg at 04/23/21 2123  . senna-docusate (Senokot-S) tablet 1 tablet  1 tablet Oral QHS PRN Amie Portland, MD   1 tablet at 04/23/21 2123    Medications Prior to Admission  Medication Sig Dispense Refill Last Dose  . glipiZIDE (GLUCOTROL XL) 5 MG 24 hr tablet Take 5 mg by mouth daily with breakfast.   04/18/2021  . hydrochlorothiazide (HYDRODIURIL) 25 MG tablet Take 25 mg by mouth daily.   04/18/2021  . losartan (COZAAR) 25 MG tablet Take 25 mg by mouth 2 (two) times daily.   04/18/2021  . metFORMIN (GLUCOPHAGE) 1000 MG tablet Take 1,000 mg by mouth  2 (two) times daily with a meal.   04/18/2021  . Multiple Vitamin (MULTIVITAMIN) tablet Take 1 tablet by mouth daily.   04/18/2021  . pioglitazone (ACTOS) 45 MG tablet Take 45 mg by mouth daily.   04/18/2021  . rOPINIRole (REQUIP) 1 MG tablet Take 1 mg by mouth at bedtime.   04/17/2021  . sertraline (ZOLOFT) 100 MG tablet Take 100 mg by mouth daily.   04/18/2021  . simvastatin (ZOCOR) 20 MG tablet Take 20 mg by mouth at bedtime.   04/17/2021  . sitaGLIPtin (JANUVIA) 100 MG tablet Take 100 mg by mouth daily.   04/18/2021    Family History  Problem Relation Age of Onset  . Bone cancer Brother   . Aneurysm Mother        brain  . Diabetes Father   . CAD Father   . Heart failure Father   . Colon cancer Father   . Cancer Father   . Alzheimer's disease Paternal Grandmother   . Dementia Paternal Grandmother   . Mental illness Paternal Grandfather   . Healthy Daughter   . Healthy Son   . Breast cancer Neg Hx    Review of Systems:     Cardiac Review of Systems: Y or  [    ]= no  Chest Pain [    ]  Resting SOB [   ] Exertional SOB  [  ]  Orthopnea [  ]   Pedal Edema [   ]    Palpitations [  ] Syncope  [  ]   Presyncope [   ]  General Review of Systems: [Y] = yes [  ]=no Constitional: recent weight change [  ]; anorexia [  ]; fatigue [  ]; nausea [  ]; night sweats [  ]; fever [  ]; or chills [  ]                                                               Dental: Last Dentist visit:   Eye : blurred vision [  ]; diplopia [   ]; vision changes [  ];  Amaurosis fugax[  ]; Resp: cough [  ];  wheezing[  ];  hemoptysis[  ]; shortness of breath[  ]; paroxysmal nocturnal dyspnea[  ]; dyspnea on exertion[  ]; or orthopnea[  ];  GI:  gallstones[  ], vomiting[  ];  dysphagia[ Y ]; melena[  ];  hematochezia [  ]; heartburn[  ];   Hx of  Colonoscopy[  ]; GU: kidney stones [  ]; hematuria[  ];   dysuria [  ];  nocturia[  ];  history of     obstruction [  ]; urinary frequency [  ]             Skin: rash, swelling[   ];, hair loss[  ];  peripheral edema[  ];  or itching[  ]; Musculosketetal: myalgias[  ];  joint swelling[  ];  joint erythema[  ];  joint pain[  ];  back pain[  ];  Heme/Lymph: bruising[  ];  bleeding[  ];  anemia[  ];  Neuro: TIA[  ];  headaches[  ];  stroke[Y  ];  vertigo[  ];  seizures[  ];   paresthesias[  ];  difficulty walking[Y  ]; dizziness, slurred speech  Psych:depression[  ]; anxiety[  ];  Endocrine: diabetes[Y];  thyroid dysfunction[  ];    Physical Exam: BP (!) 137/56   Pulse 76   Temp 98 F (36.7 C) (Temporal)   Resp 19   Ht 5\' 8"  (1.727 m)   Wt 99 kg   SpO2 94%   BMI 33.19 kg/m   General appearance: alert, cooperative and no distress Head: Normocephalic, without obvious abnormality, atraumatic Resp: clear to auscultation bilaterally Cardio: regular rate and rhythm GI: soft, non-tender; bowel sounds normal; no masses,  no organomegaly Extremities: LLE weakness Neurologic: lower extremity weakness on left, left UE weakness can not lift and   Diagnostic Studies & Laboratory data:     Recent Radiology Findings:    Results: Normal LV size and function Normal RV size and function with prominent RV moderator band Normal RA Mildly dilated LA and normal LAA with no thrombus.  Normal TV Normal PV Normal MV Trileaflet aortic valve with large well defined shimmering highly mobile mass on the aortic side of the valve.  3D images obtained to try to delineate the origin of the mass as difficult to discern whether this is originating from the valve leaflet or the aortic wall. 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 There is no significant AI.  Severe lipomatous hypertrophy of the interventricular septum with no evidence of shunt by colorflow dopper or agitated saline contrast. Normal thoracic and ascending aorta. Recommend CVTS consult.    I  have independently reviewed the above radiologic studies  and discussed with the patient   Recent Lab Findings: Lab Results  Component Value Date   WBC 8.0 04/24/2021   HGB 12.6 04/24/2021   HCT 37.4 04/24/2021   PLT 248 04/24/2021   GLUCOSE 197 (H) 04/21/2021   CHOL 195 04/19/2021   TRIG 221 (H) 04/19/2021   HDL 46 04/19/2021   LDLCALC 105 (H) 04/19/2021   ALT 16 04/18/2021   AST 23 04/18/2021   NA 136 04/21/2021   K 4.0 04/21/2021   CL 101 04/21/2021   CREATININE 0.88 04/21/2021   BUN 13 04/21/2021   CO2 27 04/21/2021   TSH 3.500 09/20/2020   INR 1.0 04/18/2021   HGBA1C 9.3 (H) 04/19/2021    Assessment / Plan:      1. Presented/Confirmed stroke 5/6, underwent IR procedure on 5/7 due to occlusion of posterior PCA 2. 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 Dysphagia, left sided weakness-- working with PT/OT, awaiting CIR bed 4. Dispo- patient with recent stroke, suspected due to mass on AV... the patient will require surgery in the future.. however she will need to complete rehabilitation from her stroke prior to proceeding.  Dr. Kipp Brood will follow up with further recommendations, okay to d/c to CIR from our standpoint, and she can follow up in our office once medically recovered to further talk about surgical intervention  I  spent 55 minutes counseling the patient face to face.   Kimberly Handler, PA-C  04/24/2021 3:11 PM    Agree with above.  This is a 65 year old female that suffered a significant stroke, likely secondary to the aortic valve fibroblastoma.  She currently is quite debilitated and has not regained much function secondary to the stroke.  She is also undergone a coronary CT likely has significant coronary artery disease.  Much work-up is required before we can definitively say that the patient is optimized for surgery.  She likely will need to go to inpatient rehab to regain some function.  For now continue antiplatelet therapy.  She likely will require a left  heart cath for further evaluation of her coronary arteries.  Likely will see her as an outpatient which is completed her inpatient rehab.   Tzipporah Nagorski Bary Leriche

## 2021-04-25 ENCOUNTER — Inpatient Hospital Stay (HOSPITAL_COMMUNITY): Payer: 59

## 2021-04-25 DIAGNOSIS — I700 Atherosclerosis of aorta: Secondary | ICD-10-CM

## 2021-04-25 LAB — BASIC METABOLIC PANEL
Anion gap: 10 (ref 5–15)
BUN: 21 mg/dL (ref 8–23)
CO2: 26 mmol/L (ref 22–32)
Calcium: 9 mg/dL (ref 8.9–10.3)
Chloride: 96 mmol/L — ABNORMAL LOW (ref 98–111)
Creatinine, Ser: 0.92 mg/dL (ref 0.44–1.00)
GFR, Estimated: >60 mL/min (ref >60)
Glucose, Bld: 196 mg/dL — ABNORMAL HIGH (ref 70–99)
Potassium: 4.1 mmol/L (ref 3.5–5.1)
Sodium: 132 mmol/L — ABNORMAL LOW (ref 135–145)

## 2021-04-25 LAB — GLUCOSE, CAPILLARY
Glucose-Capillary: 207 mg/dL — ABNORMAL HIGH (ref 70–99)
Glucose-Capillary: 156 mg/dL — ABNORMAL HIGH (ref 70–99)
Glucose-Capillary: 179 mg/dL — ABNORMAL HIGH (ref 70–99)
Glucose-Capillary: 272 mg/dL — ABNORMAL HIGH (ref 70–99)

## 2021-04-25 LAB — MAGNESIUM: Magnesium: 1.5 mg/dL — ABNORMAL LOW (ref 1.7–2.4)

## 2021-04-25 MED ORDER — NITROGLYCERIN 0.4 MG SL SUBL
SUBLINGUAL_TABLET | SUBLINGUAL | Status: AC
  Administered 2021-04-25: 0.8 mg via SUBLINGUAL
  Filled 2021-04-25: qty 2

## 2021-04-25 MED ORDER — IOHEXOL 350 MG/ML SOLN
95.0000 mL | Freq: Once | INTRAVENOUS | Status: AC | PRN
  Administered 2021-04-25: 95 mL via INTRAVENOUS

## 2021-04-25 MED ORDER — NITROGLYCERIN 0.4 MG SL SUBL: 0.4000 mg | SUBLINGUAL_TABLET | Freq: Once | SUBLINGUAL | Status: DC | PRN

## 2021-04-25 MED ORDER — MELATONIN 3 MG PO TABS
3.0000 mg | ORAL_TABLET | Freq: Every evening | ORAL | Status: DC | PRN
  Administered 2021-04-25 – 2021-04-28 (×4): 3 mg via ORAL
  Filled 2021-04-25 (×4): qty 1

## 2021-04-25 NOTE — Progress Notes (Signed)
Progress Note  Patient Name: [name] Date of Encounter: 04/25/2021  Primary Cardiologist: Werner Lean, MD   Subjective   No events overnight.  Patient notes that she is doing OK; sleepy but rouses with light tough.  No chest pain or pressure .  No SOB.  No change in arm sensation from prior.  Inpatient Medications    Scheduled Meds: . aspirin  81 mg Oral Daily  . atorvastatin  40 mg Oral Daily  . Chlorhexidine Gluconate Cloth  6 each Topical Daily  . clopidogrel  75 mg Oral Q breakfast  . insulin aspart  0-15 Units Subcutaneous TID WC  . insulin aspart  0-5 Units Subcutaneous QHS  . insulin aspart  5 Units Subcutaneous TID WC  . insulin detemir  12 Units Subcutaneous BID  . labetalol  20 mg Intravenous Once  . losartan  25 mg Oral BID  . pantoprazole  40 mg Oral QHS   Continuous Infusions: . sodium chloride Stopped (04/24/21 2341)   PRN Meds: acetaminophen **OR** acetaminophen (TYLENOL) oral liquid 160 mg/5 mL **OR** acetaminophen, guaiFENesin, labetalol, senna-docusate   Vital Signs    Vitals:   04/24/21 1948 04/25/21 0010 04/25/21 0414 04/25/21 0721  BP: (!) 168/78 (!) 173/81 (!) 155/83 (!) 148/63  Pulse: 95 97 88 79  Resp: 19 18 19 18   Temp: 99.5 F (37.5 C) 99.2 F (37.3 C) 98.9 F (37.2 C) 99.2 F (37.3 C)  TempSrc: Oral   Oral  SpO2: 96% 94% 93% 93%  Weight:      Height:        Intake/Output Summary (Last 24 hours) at 04/25/2021 1008 Last data filed at 04/25/2021 0448 Gross per 24 hour  Intake 400 ml  Output 1320 ml  Net -920 ml   Filed Weights   04/20/21 2339 04/24/21 1220  Weight: 99 kg 99 kg    Telemetry    SR -> Off telemetry - Personally Reviewed  ECG    No new - Personally Reviewed  Physical Exam   GEN: No acute distress.   Neck: No JVD Cardiac: RRR, no murmurs, rubs, or gallops.  Respiratory: Clear to auscultation bilaterally. GI: Soft, nontender, non-distended  MS: No edema; No deformity. Neuro:  left  sided weakness arm and left with decrease sensation Psych: Normal affect   Labs    Chemistry Recent Labs  Lab 04/18/21 1821 04/20/21 0943 04/21/21 0834 04/25/21 0600  NA 133* 132* 136 132*  K 4.6 3.9 4.0 4.1  CL 96* 99 101 96*  CO2 27 24 27 26   GLUCOSE 272* 256* 197* 196*  BUN 25* 10 13 21   CREATININE 1.09* 0.89 0.88 0.92  CALCIUM 9.0 8.8* 9.2 9.0  PROT 8.0  --   --   --   ALBUMIN 4.0  --   --   --   AST 23  --   --   --   ALT 16  --   --   --   ALKPHOS 45  --   --   --   BILITOT 0.6  --   --   --   GFRNONAA 56* >60 >60 >60  ANIONGAP 10 9 8 10      Hematology Recent Labs  Lab 04/18/21 1821 04/24/21 1059  WBC 9.0 8.0  RBC 4.52 4.35  HGB 12.9 12.6  HCT 38.0 37.4  MCV 84.1 86.0  MCH 28.5 29.0  MCHC 33.9 33.7  RDW 12.9 13.2  PLT 259 248  Cardiac EnzymesNo results for input(s): TROPONINI in the last 168 hours. No results for input(s): TROPIPOC in the last 168 hours.   BNPNo results for input(s): BNP, PROBNP in the last 168 hours.   DDimer No results for input(s): DDIMER in the last 168 hours.   Radiology    No results found.  Cardiac Studies   Transthoracic Echocardiogram: Date: 04/19/21 Results:  Left ventricular ejection fraction, by estimation, is 70 to 75%. The  left ventricle has hyperdynamic function. The left ventricle has no  regional wall motion abnormalities. Left ventricular diastolic parameters  are consistent with Grade I diastolic  dysfunction (impaired relaxation). Elevated left atrial pressure.  2. Right ventricular systolic function is normal. The right ventricular  size is normal. Tricuspid regurgitation signal is inadequate for assessing  PA pressure.  3. Left atrial size was mildly dilated.  4. The mitral valve is normal in structure. Mild mitral valve  regurgitation. No evidence of mitral stenosis.  5. The aortic valve is normal in structure. Aortic valve regurgitation is  not visualized. No aortic stenosis is present.  6.  The inferior vena cava is normal in size with greater than 50%  respiratory variability, suggesting right atrial pressure of 3 mmHg.   Transesophageal Echocardiogram: Date: 04/24/21 Results: No LAA Thrombus Lipomatous interatrial septum There is a 12 X 8 mm well circumscribed mass on the RCC that appears to have its attachment on the aortic side without concomitant AI  Patient Profile     65 y.o. female stroke found to have PFE.  Also has risk factors for obstructive CAD; evaluation for surgery  Assessment & Plan    PFE with associated embolic stroke pending surgery (TCTS MD aware, pending final eval) HTN and DM, with HLD - continue ASA & plavix per stroke team, statin, and losartan - planned for CCTA today; based on results will discuss with CT surgery about bypass in addition to surgical excision  [facility] will sign off.   Medication Recommendations:  DAPT and statin, metorprolol is for image optimization; nitro is prior to CCTA Other recommendations (labs, testing, etc):  Getting CCTA today Follow up as an outpatient:  We are working to arrange post rehab follow up  For questions or updates, please contact [place] Please consult [email or web address] for contact info under Cardiology/STEMI.      Signed, Werner Lean, MD  04/25/2021, 10:08 AM

## 2021-04-25 NOTE — Progress Notes (Signed)
Inpatient Rehab Admissions Coordinator:   Spoke to [facility] at [facility] Case Management to discuss progress towards determination for CIR.  Refaxed clinicals per her requested and asked for possible admission today.  She said they would review and let me know if she was approved.   Shann Medal, PT, DPT Admissions Coordinator [phone] 04/25/21  10:04 AM

## 2021-04-25 NOTE — Progress Notes (Signed)
OT Cancellation Note  Patient Details Name: [name] MRN: [id number] DOB: 1956/09/29   Cancelled Treatment:    Reason Eval/Treat Not Completed: Patient at procedure or test/ unavailable.  Patient off the floor for the majority of the day, OT to continue efforts as appropriate.    [name] [name] 04/25/2021, 4:38 PM

## 2021-04-25 NOTE — Progress Notes (Signed)
Inpatient Rehab Admissions Coordinator:   Still no determination from insurance on rehab request.  At 3pm case was with the nurse for review, still needing MD review.  Will f/u with patient over the weekend if we get an authorization, otherwise I will check with her on Monday.    Shann Medal, PT, DPT Admissions Coordinator [phone] 04/25/21  4:03 PM

## 2021-04-25 NOTE — Progress Notes (Signed)
Reports non productive cough today   STROKE TEAM PROGRESS NOTE    INTERVAL HISTORY No acute events.  Patient had TEE done yesterday which showed 12 x 8 mm mass over the aortic valve ?  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  Cardiology has been consulted for preop cardiac clearance.  CT coronary has been ordered but not yet done.  Plans to do loop recorder have been canceled  . Neuro exam is unchanged.  Vital signs stable. Yet await insurance approval for transfer to inpatient rehab   OBJECTIVE Vitals:   04/24/21 1948 04/25/21 0010 04/25/21 0414 04/25/21 0721  BP: (!) 168/78 (!) 173/81 (!) 155/83 (!) 148/63  Pulse: 95 97 88 79  Resp: 19 18 19 18   Temp: 99.5 F (37.5 C) 99.2 F (37.3 C) 98.9 F (37.2 C) 99.2 F (37.3 C)  TempSrc: Oral   Oral  SpO2: 96% 94% 93% 93%  Weight:      Height:        CBC:  Recent Labs  Lab 04/18/21 1821 04/24/21 1059  WBC 9.0 8.0  NEUTROABS 5.7 6.1  HGB 12.9 12.6  HCT 38.0 37.4  MCV 84.1 86.0  PLT 259 573    Basic Metabolic Panel:  Recent Labs  Lab 04/21/21 0834 04/25/21 0600  NA 136 132*  K 4.0 4.1  CL 101 96*  CO2 27 26  GLUCOSE 197* 196*  BUN 13 21  CREATININE 0.88 0.92  CALCIUM 9.2 9.0  MG  --  1.5*    Lipid Panel:     Component Value Date/Time   CHOL 195 04/19/2021 0427   CHOL 233 (H) 09/20/2020 1132   CHOL 205 (H) 09/06/2013 1518   TRIG 221 (H) 04/19/2021 0427   TRIG 174 09/06/2013 1518   HDL 46 04/19/2021 0427   HDL 47 09/20/2020 1132   HDL 53 09/06/2013 1518   CHOLHDL 4.2 04/19/2021 0427   VLDL 44 (H) 04/19/2021 0427   VLDL 35 09/06/2013 1518   LDLCALC 105 (H) 04/19/2021 0427   LDLCALC 152 (H) 09/20/2020 1132   LDLCALC 117 (H) 09/06/2013 1518   HgbA1c:  Lab Results  Component Value Date   HGBA1C 9.3 (H) 04/19/2021   Urine Drug Screen: No results found for: LABOPIA, COCAINSCRNUR, LABBENZ, AMPHETMU, THCU, LABBARB  Alcohol  Level No results found for: ETH  IMAGING  MR BRAIN WO CONTRAST MR ANGIO HEAD WO CONTRAST 04/18/2021 IMPRESSION:  1. Small acute infarct of the anterolateral right thalamus adjacent to the posterior limb of the right internal capsule.  2. Right PCA mid P1 segment occlusion. Loss of normal flow related enhancement in the right vertebral artery V4 segment.  MR BRAIN WO CONTRAST 04/19/2021 IMPRESSION:  1. Stable acute nonhemorrhagic infarct involving the lateral aspect of the right thalamus.  2. Additional acute nonhemorrhagic infarcts involving the more superior and medial right thalamus, the right occipital lobe, the left side of the splenium of the corpus callosum, and the right cerebellum.  3. Stable white matter disease. This likely reflects the sequela of chronic microvascular ischemia.    ECHOCARDIOGRAM COMPLETE 04/19/2021 IMPRESSIONS   1. Left ventricular ejection fraction, by estimation, is 70 to 75%. The left ventricle has hyperdynamic function. The left ventricle has no regional wall motion abnormalities. Left ventricular diastolic parameters are consistent with Grade I diastolic dysfunction (impaired relaxation). Elevated left atrial pressure.   2. Right ventricular systolic function is normal. The right  ventricular size is normal. Tricuspid regurgitation signal is inadequate for assessing PA pressure.   3. Left atrial size was mildly dilated.   4. The mitral valve is normal in structure. Mild mitral valve regurgitation. No evidence of mitral stenosis.   5. The aortic valve is normal in structure. Aortic valve regurgitation is not visualized. No aortic stenosis is present.   6. The inferior vena cava is normal in size with greater than 50% respiratory variability, suggesting right atrial pressure of 3 mmHg.   CT HEAD CODE STROKE WO CONTRAST 04/18/2021 IMPRESSION:  1. Normal CT appearance of the brain.  2. ASPECTS is 10/10.   CT ANGIO HEAD NECK W WO CM W PERF (CODE  STROKE) 04/18/2021 IMPRESSION:  1. Occlusion of the right posterior cerebral artery P1 segment with 13 mL area of ischemic penumbra in the right occipital lobe.  2. 50 % stenosis of the proximal right internal carotid artery secondary to mixed density atherosclerosis.  Aortic Atherosclerosis (ICD10-I70.0).   ECG - SR rate 82 BPM. Possible old MI. (See cardiology reading for complete details)  PHYSICAL EXAM Blood pressure (!) 148/63, pulse 79, temperature 99.2 F (37.3 C), temperature source Oral, resp. rate 18, height 5\' 8"  (1.727 m), weight 99 kg, SpO2 93 %.  GENERAL: Pleasant middle-aged Caucasian lady she is awake and alert and responsive.  HEENT: This is normal.  ABDOMEN: soft  EXTREMITIES: No edema   BACK: Normal  SKIN: Normal by inspection.    MENTAL STATUS: Alert and oriented. Speech, language and cognition are generally intact. Judgment and insight normal.   CRANIAL NERVES: Pupils are equal, round and reactive to light and accomodation; extra ocular movements are full, there is no significant nystagmus; visual fields are full; there is flattening of the nasolabial fold on left side; tongue is midline  MOTOR: Direct strength is 5/5 but there is left hemiparesis with left upper extremity 3/5 and left lower extremity 4/5 strength with drift on both..  Significant weakness of left grip and intrinsic hand muscles.  Right side is normal.  COORDINATION: There is significant dysmetria of the left upper extremity and left lower extremity.  SENSATION: There is reduced sensation to temperature light touch along the left upper extremity and left leg.  ASSESSMENT/PLAN [name] is a 65 y.o. female with history of poorly controlled diabetes and hypertension, hyperlipidemia, obesity, anxiety, depression, presenting to [facility] with acute dizziness left-sided weakness, left-sided numbness, and possible visual deficits. She received IV tPA  via telemedicine at  [place] hrs. 04/18/2021 and was found to have occlusion of the right P2/PCA and was taken to IR for thrombectomy. MT performed with combined stentriever and aspiration. Total of 1 pass with complete recanalization (TICI3). No hemorrhagic complication on flat panel head CT. [name].  Stroke: acute nonhemorrhagic lateral aspect of the right thalamus. Acute nonhemorrhagic infarcts involving the more superior and medial right thalamus, the right occipital lobe, the left side of the splenium of the corpus callosum, and the right cerebellum - embolic - etiology unknown.  Resultant dysmetria involving the left side and left-sided numbness.  Code Stroke CT Head - Normal CT appearance of the brain. ASPECTS is 10/10.   CT head - not ordered  MRI head - 04/18/21 - Small acute infarct of the anterolateral right thalamus adjacent to the posterior limb of the right internal capsule.   MRI head - 04/19/21 - Stable acute nonhemorrhagic infarct involving the lateral aspect of the right thalamus.  Additional acute nonhemorrhagic infarcts involving the more superior and medial right thalamus, the right occipital lobe, the left side of the splenium of the corpus callosum, and the right cerebellum. Stable white matter disease. This likely reflects the sequela of chronic microvascular ischemia.    MRA head - Right PCA mid P1 segment occlusion. Loss of normal flow related enhancement in the right vertebral artery V4 segment.  CTA H&N - 50 % stenosis of the proximal right internal carotid artery secondary to mixed density atherosclerosis.   CT Perfusion - Occlusion of the right posterior cerebral artery P1 segment with 13 mL area of ischemic penumbra in the right occipital lobe.   Carotid Doppler - CTA neck ordered - carotid dopplers not indicated.  2D Echo - EF 70 to 75%. No cardiac source of emboli identified.   TEE  12 X 8 mm well circumscribed mass on the RCC that appears to have its attachment on  the aortic side without concomitant AI. No clot  [employer] Virus 2 - negative  LDL - 105  HgbA1c - 9.3  UDS - not ordered  VTE prophylaxis - SCDs Diet: Carb restricted  No antithrombotic or antiplatelet prior to admission, now on  aspirin 81 mg daily and Plavix 75 mg daily x 3 weeks   Patient will be counseled to be compliant with her antithrombotic medications  Ongoing aggressive stroke risk factor management  Therapy recommendations:  CIR recommended -  accepted with insurance pending  Disposition:  CIR pending  Hypertension  Home BP meds: Cozaar and HCTZ  Current BP meds: On home cozaar, normodyne prn  Stable, hypertensive   Permissive hypertension bp less than 180 . Long-term BP goal normotensive  Hyperlipidemia  Home Lipid lowering medication: none  LDL 105, goal < 70  Current lipid lowering medication: will add Lipitor 40 mg daily  Continue statin at discharge  Diabetes  Home diabetic meds: Actos 45, Januvia 100, Glucotrol 5 mg,  Metformin 1000mg   bid  Current diabetic meds: insulin  HgbA1c 9.3, goal < 7.0 Recent Labs    04/24/21 1708 04/24/21 2221 04/25/21 0718  GLUCAP 230* 159* 207*  Diabetes coordinator consult appreciated, will add Levimir 10units BID, Novolog meal time coverage of 3 units, SSI to be changed to  moderate scale  Other Stroke Risk Factors  Advanced age  Former cigarette smoker - quit  ETOH use, advised to drink no more than 1 alcoholic beverage per day.  Obesity, recommend weight loss, diet and exercise as appropriate   Other Active Problems, Findings, Recommendations and/or Plan  Code status - Full Code  Aortic Atherosclerosis (ICD10-I70.0).  CKD - stage  3a - creatinine - 1.09 Cough: robitussin, CBC sent, monitor    Hospital day # 6  Continue ongoing therapies.  Coronary CTA as per cardiology for cardiac risk ratification.  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  Transfer to rehab when bed available and after insurance approval.  Greater than 50% time during this 25-minute visit was spent on counseling and coordination of care and discussion with care team..  Discussed with Dr. Kipp Brood cardiothoracic surgery Antony Contras, MD Medical Director [place] Pager: [phone] 04/25/2021 11:12 AM  To contact Stroke Continuity provider, please refer to [email or web address] After hours, contact General Neurology

## 2021-04-25 NOTE — Progress Notes (Signed)
Inpatient Diabetes Program Recommendations  AACE/ADA: New Consensus Statement on Inpatient Glycemic Control (2015)  Target Ranges:  Prepandial:   less than 140 mg/dL      Peak postprandial:   less than 180 mg/dL (1-2 hours)      Critically ill patients:  140 - 180 mg/dL   Lab Results  Component Value Date   GLUCAP 207 (H) 04/25/2021   HGBA1C 9.3 (H) 04/19/2021    Review of Glycemic Control  Diabetes history: DM 2 Outpatient Diabetes medications: Metformin 1000 mg bid, Actos 45 mg Daily, Januvia 100 mg Daily Current orders for Inpatient glycemic control:  Levemir 12 units bid Novolog 0-15 units tid + hs Novolog 5 units tid meal coverage  Inpatient Diabetes Program Recommendations:    -  Increase Levemir to 14 units bid -  Add Tradjenta 5 mg Daily (pts home dose of Januvia 100 mg Daily substitute)  May need insulin training at time of d/c based on glucose trends here inpatient.  Attempted to call in room x2 no answer. May need to follow if pt goes to rehab and do teaching closer to d/c. Inpt rehab will need to consult [place] as pt falls off of our list.  Spoke with daughter over the phone regarding possible insulin use after discharge. Discussed A1c and lifestyle modifications. Daughter reports pt did not take medications consistently. Daughter said whenever she did take everything at once the pt would bottom out and have hypoglycemia. Daughter had questions about diet. She reports pt drinks water but unsure of diet.   Thanks,  Tama Headings RN, MSN, BC-ADM Inpatient Diabetes Coordinator Team Pager [phone] (8a-5p)

## 2021-04-26 DIAGNOSIS — I358 Other nonrheumatic aortic valve disorders: Secondary | ICD-10-CM

## 2021-04-26 DIAGNOSIS — I63419 Cerebral infarction due to embolism of unspecified middle cerebral artery: Secondary | ICD-10-CM

## 2021-04-26 DIAGNOSIS — E1165 Type 2 diabetes mellitus with hyperglycemia: Secondary | ICD-10-CM

## 2021-04-26 DIAGNOSIS — E785 Hyperlipidemia, unspecified: Secondary | ICD-10-CM

## 2021-04-26 LAB — CBC
HCT: 36.5 % (ref 36.0–46.0)
Hemoglobin: 12.4 g/dL (ref 12.0–15.0)
MCH: 29.2 pg (ref 26.0–34.0)
MCHC: 34 g/dL (ref 30.0–36.0)
MCV: 86.1 fL (ref 80.0–100.0)
Platelets: 254 10*3/uL (ref 150–400)
RBC: 4.24 MIL/uL (ref 3.87–5.11)
RDW: 13.5 % (ref 11.5–15.5)
WBC: 8.1 10*3/uL (ref 4.0–10.5)
nRBC: 0 % (ref 0.0–0.2)

## 2021-04-26 LAB — BASIC METABOLIC PANEL
Anion gap: 8 (ref 5–15)
BUN: 28 mg/dL — ABNORMAL HIGH (ref 8–23)
CO2: 28 mmol/L (ref 22–32)
Calcium: 8.7 mg/dL — ABNORMAL LOW (ref 8.9–10.3)
Chloride: 96 mmol/L — ABNORMAL LOW (ref 98–111)
Creatinine, Ser: 1.13 mg/dL — ABNORMAL HIGH (ref 0.44–1.00)
GFR, Estimated: 54 mL/min — ABNORMAL LOW (ref >60)
Glucose, Bld: 149 mg/dL — ABNORMAL HIGH (ref 70–99)
Potassium: 4 mmol/L (ref 3.5–5.1)
Sodium: 132 mmol/L — ABNORMAL LOW (ref 135–145)

## 2021-04-26 LAB — GLUCOSE, CAPILLARY
Glucose-Capillary: 177 mg/dL — ABNORMAL HIGH (ref 70–99)
Glucose-Capillary: 204 mg/dL — ABNORMAL HIGH (ref 70–99)
Glucose-Capillary: 245 mg/dL — ABNORMAL HIGH (ref 70–99)
Glucose-Capillary: 160 mg/dL — ABNORMAL HIGH (ref 70–99)

## 2021-04-26 MED ORDER — FLEET ENEMA 7-19 GM/118ML RE ENEM
1.0000 | ENEMA | Freq: Every day | RECTAL | Status: DC | PRN
  Filled 2021-04-26: qty 1

## 2021-04-26 NOTE — Progress Notes (Addendum)
Reports non productive cough today   STROKE TEAM PROGRESS NOTE    INTERVAL HISTORY No acute events.  Patient had TEE done  5/12 which showed 12 x 8 mm mass over the aortic valve ?  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  Cardiology has been consulted for preop cardiac clearance.  CT coronary done and listed below.    Neuro exam is unchanged.  Vital signs stable.  await insurance approval for transfer to inpatient rehab   OBJECTIVE Vitals:   04/25/21 1936 04/25/21 2348 04/26/21 0400 04/26/21 0729  BP: (!) 130/56 106/60 (!) 142/61 122/69  Pulse: 62 60 62 69  Resp: 19 18 18 18   Temp: 98 F (36.7 C) 98.1 F (36.7 C) 98.1 F (36.7 C) 98.3 F (36.8 C)  TempSrc: Oral Oral Oral Oral  SpO2: 93% 93% 95% 93%  Weight:      Height:        CBC:  Recent Labs  Lab 04/24/21 1059 04/26/21 0246  WBC 8.0 8.1  NEUTROABS 6.1  --   HGB 12.6 12.4  HCT 37.4 36.5  MCV 86.0 86.1  PLT 248 812    Basic Metabolic Panel:  Recent Labs  Lab 04/25/21 0600 04/26/21 0246  NA 132* 132*  K 4.1 4.0  CL 96* 96*  CO2 26 28  GLUCOSE 196* 149*  BUN 21 28*  CREATININE 0.92 1.13*  CALCIUM 9.0 8.7*  MG 1.5*  --     Lipid Panel:     Component Value Date/Time   CHOL 195 04/19/2021 0427   CHOL 233 (H) 09/20/2020 1132   CHOL 205 (H) 09/06/2013 1518   TRIG 221 (H) 04/19/2021 0427   TRIG 174 09/06/2013 1518   HDL 46 04/19/2021 0427   HDL 47 09/20/2020 1132   HDL 53 09/06/2013 1518   CHOLHDL 4.2 04/19/2021 0427   VLDL 44 (H) 04/19/2021 0427   VLDL 35 09/06/2013 1518   LDLCALC 105 (H) 04/19/2021 0427   LDLCALC 152 (H) 09/20/2020 1132   LDLCALC 117 (H) 09/06/2013 1518   HgbA1c:  Lab Results  Component Value Date   HGBA1C 9.3 (H) 04/19/2021     IMAGING  MR BRAIN WO CONTRAST MR ANGIO HEAD WO CONTRAST 04/18/2021 IMPRESSION:  1. Small acute infarct of the anterolateral right thalamus adjacent to the  posterior limb of the right internal capsule.  2. Right PCA mid P1 segment occlusion. Loss of normal flow related enhancement in the right vertebral artery V4 segment.  MR BRAIN WO CONTRAST 04/19/2021 IMPRESSION:  1. Stable acute nonhemorrhagic infarct involving the lateral aspect of the right thalamus.  2. Additional acute nonhemorrhagic infarcts involving the more superior and medial right thalamus, the right occipital lobe, the left side of the splenium of the corpus callosum, and the right cerebellum.  3. Stable white matter disease. This likely reflects the sequela of chronic microvascular ischemia.    ECHOCARDIOGRAM COMPLETE 04/19/2021 IMPRESSIONS   1. Left ventricular ejection fraction, by estimation, is 70 to 75%. The left ventricle has hyperdynamic function. The left ventricle has no regional wall motion abnormalities. Left ventricular diastolic parameters are consistent with Grade I diastolic dysfunction (impaired relaxation). Elevated left atrial pressure.   2. Right ventricular systolic function is normal. The right ventricular size is normal. Tricuspid regurgitation signal is inadequate for assessing PA pressure.   3. Left atrial size was mildly dilated.   4. The mitral valve is normal  in structure. Mild mitral valve regurgitation. No evidence of mitral stenosis.   5. The aortic valve is normal in structure. Aortic valve regurgitation is not visualized. No aortic stenosis is present.   6. The inferior vena cava is normal in size with greater than 50% respiratory variability, suggesting right atrial pressure of 3 mmHg.   CT HEAD CODE STROKE WO CONTRAST 04/18/2021 IMPRESSION:  1. Normal CT appearance of the brain.  2. ASPECTS is 10/10.   CT ANGIO HEAD NECK W WO CM W PERF (CODE STROKE) 04/18/2021 IMPRESSION:  1. Occlusion of the right posterior cerebral artery P1 segment with 13 mL area of ischemic penumbra in the right occipital lobe.  2. 50 % stenosis of the proximal right internal  carotid artery secondary to mixed density atherosclerosis.  Aortic Atherosclerosis (ICD10-I70.0).   CT CORONARY 04/25/21  1. Coronary calcium score of 1904. This was 99th percentile for age, sex, and race matched control.  2. Normal coronary origin with right dominance.  3. CAD-RADS 4 Severe stenosis. (70-99% or > 50% left main). CT FFR is recommended and will be sent. Consider symptom-guided anti-ischemic pharmacotherapy as well as risk factor modification per guideline directed care.  4.  Aortic atherosclerosis noted.  5. 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  6. Mild to moderate mitral annular calcification noted.  ECG - SR rate 82 BPM. Possible old MI. (See cardiology reading for complete details)  PHYSICAL EXAM Blood pressure 122/69, pulse 69, temperature 98.3 F (36.8 C), temperature source Oral, resp. rate 18, height 5\' 8"  (1.727 m), weight 99 kg, SpO2 93 %.  GENERAL: Pleasant middle-aged Caucasian lady she is awake and alert and responsive.  HEENT: This is normal.  ABDOMEN: soft  EXTREMITIES: No edema   BACK: Normal  SKIN: Normal by inspection.    MENTAL STATUS: Alert and oriented. Speech, language and cognition are generally intact. Judgment and insight normal.   CRANIAL NERVES: Pupils are equal, round and reactive to light and accomodation; extra ocular movements are full, there is no significant nystagmus; visual fields are full; there is flattening of the nasolabial fold on left side; tongue is midline  MOTOR: Direct strength is 5/5 but there is left hemiparesis with left upper extremity 3/5 and left lower extremity 4/5 strength with drift on both..  Significant weakness of left grip and intrinsic hand muscles.  Right side is normal.  COORDINATION: There is significant dysmetria of the left upper extremity and left lower extremity.  SENSATION: There is reduced  sensation to temperature light touch along the left upper extremity and left leg.  ASSESSMENT/PLAN [name] is a 65 y.o. female with history of poorly controlled diabetes and hypertension, hyperlipidemia, obesity, anxiety, depression, presenting to [facility] with acute dizziness left-sided weakness, left-sided numbness, and possible visual deficits. She received IV tPA  via telemedicine at [place] hrs. 04/18/2021 and was found to have occlusion of the right P2/PCA and was taken to IR for thrombectomy. MT performed with combined stentriever and aspiration. Total of 1 pass with complete recanalization (TICI3). No hemorrhagic complication on flat panel head CT. [name].  Stroke: acute nonhemorrhagic lateral aspect of the right thalamus. Acute nonhemorrhagic infarcts involving the more superior and medial right thalamus, the right occipital lobe, the left side of the splenium of the corpus callosum, and the right cerebellum - embolic - etiology unknown.  Resultant dysmetria involving the left  side and left-sided numbness.  Code Stroke CT Head - Normal CT appearance of the brain. ASPECTS is 10/10.   CT head - not ordered  MRI head - 04/18/21 - Small acute infarct of the anterolateral right thalamus adjacent to the posterior limb of the right internal capsule.   MRI head - 04/19/21 - Stable acute nonhemorrhagic infarct involving the lateral aspect of the right thalamus. Additional acute nonhemorrhagic infarcts involving the more superior and medial right thalamus, the right occipital lobe, the left side of the splenium of the corpus callosum, and the right cerebellum. Stable white matter disease. This likely reflects the sequela of chronic microvascular ischemia.    MRA head - Right PCA mid P1 segment occlusion. Loss of normal flow related enhancement in the right vertebral artery V4 segment.  CTA H&N - 50 % stenosis of the proximal right internal carotid  artery secondary to mixed density atherosclerosis.   CT Perfusion - Occlusion of the right posterior cerebral artery P1 segment with 13 mL area of ischemic penumbra in the right occipital lobe.   Carotid Doppler - CTA neck ordered - carotid dopplers not indicated.  2D Echo - EF 70 to 75%. No cardiac source of emboli identified.   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 No clot  [employer] Virus 2 - negative  LDL - 105  HgbA1c - 9.3  UDS - not ordered  VTE prophylaxis - SCDs Diet: Carb restricted  No antithrombotic or antiplatelet prior to admission, now on  aspirin 81 mg daily and Plavix 75 mg daily x 3 weeks   Patient will be counseled to be compliant with her antithrombotic medications  Ongoing aggressive stroke risk factor management  Therapy recommendations:  CIR recommended -  accepted with insurance pending  Disposition:  CIR pending  Hypertension  Home BP meds: Cozaar and HCTZ  Current BP meds: On home cozaar, normodyne prn  Stable, hypertensive   Permissive hypertension bp less than 180 . Long-term BP goal normotensive  Hyperlipidemia  Home Lipid lowering medication: none  LDL 105, goal < 70  Current lipid lowering medication: will add Lipitor 40 mg daily  Continue statin at discharge  Diabetes  Home diabetic meds: Actos 45, Januvia 100, Glucotrol 5 mg,  Metformin 1000mg   bid  Current diabetic meds: insulin  HgbA1c 9.3, goal < 7.0 Recent Labs    04/25/21 1634 04/25/21 2259 04/26/21 0619  GLUCAP 179* 272* 177*  Diabetes coordinator consult appreciated, will add Levimir 10units BID, Novolog meal time coverage of 3 units, SSI to be changed to  moderate scale  Other Stroke Risk Factors  Advanced age  Former cigarette smoker - quit  ETOH use, advised to drink no more than 1 alcoholic beverage per day.  Obesity, recommend weight loss, diet and exercise  as appropriate   Other Active Problems, Findings, Recommendations and/or Plan  Code status - Full Code  Aortic Atherosclerosis (ICD10-I70.0).  CKD - stage  3a - creatinine - 1.09 Cough: robitussin, CBC sent, monitor    Hospital day # 7  ATTENDING NOTE: I reviewed above note and agree with the assessment and plan. Pt was seen and examined.   65 year old female with history of diabetes, hypertension, hyperlipidemia, obesity admitted for dizziness, left-sided weakness numbness and vision changes.  CT no acute abnormality status post tPA.  MRI showed right thalamic infarct with right P1 occlusion CTA head  and neck showed right P1 occlusion right ICA 50% stenosis.  Status post thrombectomy with TICI3 reperfusion.  MRI repeat showed right thalamic, right occipital, left splenium and right cerebellum scattered small/punctate infarcts.  EF 70 to 75%.  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  Cardiology and cardiovascular surgery on board, planning for further surgery after rehab.  Currently undergoing cardiac work-up in provision for further surgery.  LDL 105, A1c 9.3, creatinine 0.92.  On exam, patient awake, alert, eyes open, orientated to age, place, time. No aphasia, fluent language, following all simple commands. Able to name and repeat. No gaze palsy, tracking bilaterally, visual field full, PERRL. Slight left facial droop. Tongue midline. RUE 5/5, LUE proximal and bicep and tricep 4/5, finger grip 3/5. RLE at least 4/5, LLE 3-/5, but b/l LE distal DF/PF 5/5. Sensation symmetrical bilaterally, right FTN intact, left FTN ataxic due to weakness, gait not tested.   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  Currently on aspirin 81 and Plavix 75 DAPT for 3 weeks and then aspirin alone.  Continue Lipitor 40.  Pending CIR  if cardiac work-up finished per cardiology.  Will discuss with cardiology team regarding anticoagulation in the setting of AV mass before surgery.  Kimberly Hawking, MD PhD Stroke Neurology 04/26/2021 1:22 PM

## 2021-04-27 ENCOUNTER — Encounter (HOSPITAL_COMMUNITY): Payer: Self-pay | Admitting: Cardiology

## 2021-04-27 LAB — GLUCOSE, CAPILLARY
Glucose-Capillary: 190 mg/dL — ABNORMAL HIGH (ref 70–99)
Glucose-Capillary: 170 mg/dL — ABNORMAL HIGH (ref 70–99)
Glucose-Capillary: 156 mg/dL — ABNORMAL HIGH (ref 70–99)
Glucose-Capillary: 88 mg/dL (ref 70–99)

## 2021-04-27 LAB — CBC
HCT: 37.1 % (ref 36.0–46.0)
Hemoglobin: 12.4 g/dL (ref 12.0–15.0)
MCH: 28.8 pg (ref 26.0–34.0)
MCHC: 33.4 g/dL (ref 30.0–36.0)
MCV: 86.1 fL (ref 80.0–100.0)
Platelets: 263 10*3/uL (ref 150–400)
RBC: 4.31 MIL/uL (ref 3.87–5.11)
RDW: 13.2 % (ref 11.5–15.5)
WBC: 9.7 10*3/uL (ref 4.0–10.5)
nRBC: 0 % (ref 0.0–0.2)

## 2021-04-27 LAB — BASIC METABOLIC PANEL
Anion gap: 10 (ref 5–15)
BUN: 26 mg/dL — ABNORMAL HIGH (ref 8–23)
CO2: 26 mmol/L (ref 22–32)
Calcium: 8.6 mg/dL — ABNORMAL LOW (ref 8.9–10.3)
Chloride: 99 mmol/L (ref 98–111)
Creatinine, Ser: 1.02 mg/dL — ABNORMAL HIGH (ref 0.44–1.00)
GFR, Estimated: >60 mL/min (ref >60)
Glucose, Bld: 189 mg/dL — ABNORMAL HIGH (ref 70–99)
Potassium: 3.9 mmol/L (ref 3.5–5.1)
Sodium: 135 mmol/L (ref 135–145)

## 2021-04-27 NOTE — Plan of Care (Signed)
  Problem: Education: Goal: Knowledge of General Education information will improve Description: Including pain rating scale, medication(s)/side effects and non-pharmacologic comfort measures Outcome: Progressing   Problem: Health Behavior/Discharge Planning: Goal: Ability to manage health-related needs will improve Outcome: Progressing   Problem: Clinical Measurements: Goal: Ability to maintain clinical measurements within normal limits will improve Outcome: Progressing Goal: Will remain free from infection Outcome: Progressing Goal: Diagnostic test results will improve Outcome: Progressing Goal: Respiratory complications will improve Outcome: Progressing Goal: Cardiovascular complication will be avoided Outcome: Progressing   Problem: Activity: Goal: Risk for activity intolerance will decrease Outcome: Progressing   Problem: Nutrition: Goal: Adequate nutrition will be maintained Outcome: Progressing   Problem: Coping: Goal: Level of anxiety will decrease Outcome: Progressing   Problem: Elimination: Goal: Will not experience complications related to bowel motility Outcome: Progressing Goal: Will not experience complications related to urinary retention Outcome: Progressing   Problem: Pain Managment: Goal: General experience of comfort will improve Outcome: Progressing   Problem: Safety: Goal: Ability to remain free from injury will improve Outcome: Progressing   Problem: Skin Integrity: Goal: Risk for impaired skin integrity will decrease Outcome: Progressing   Problem: Education: Goal: Knowledge of disease or condition will improve Outcome: Progressing Goal: Knowledge of secondary prevention will improve Outcome: Progressing Goal: Knowledge of patient specific risk factors addressed and post discharge goals established will improve Outcome: Progressing Goal: Individualized Educational Video(s) Outcome: Progressing   Problem: Education: Goal: Knowledge  of disease or condition will improve Outcome: Progressing Goal: Knowledge of secondary prevention will improve Outcome: Progressing Goal: Knowledge of patient specific risk factors addressed and post discharge goals established will improve Outcome: Progressing Goal: Individualized Educational Video(s) Outcome: Progressing   Problem: Coping: Goal: Will verbalize positive feelings about self Outcome: Progressing Goal: Will identify appropriate support needs Outcome: Progressing   Problem: Health Behavior/Discharge Planning: Goal: Ability to manage health-related needs will improve Outcome: Progressing   Problem: Self-Care: Goal: Ability to participate in self-care as condition permits will improve Outcome: Progressing Goal: Verbalization of feelings and concerns over difficulty with self-care will improve Outcome: Progressing Goal: Ability to communicate needs accurately will improve Outcome: Progressing   Problem: Nutrition: Goal: Risk of aspiration will decrease Outcome: Progressing Goal: Dietary intake will improve Outcome: Progressing   Problem: Ischemic Stroke/TIA Tissue Perfusion: Goal: Complications of ischemic stroke/TIA will be minimized Outcome: Progressing

## 2021-04-27 NOTE — Progress Notes (Addendum)
STROKE TEAM PROGRESS NOTE    INTERVAL HISTORY No acute events. 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  Cardiology has been consulted for preop cardiac clearance.  CT coronary done and listed below.    Neuro exam is unchanged.  Vital signs stable.  await insurance approval for transfer to inpatient rehab Patient has questions regarding cardiothoracic plan of care    OBJECTIVE Vitals:   04/26/21 1935 04/27/21 0002 04/27/21 0338 04/27/21 0814  BP: 138/71 (!) 142/69 (!) 154/81 (!) 146/55  Pulse: 84 81 80 81  Resp: 18 18 18 16   Temp: 98.4 F (36.9 C) 98 F (36.7 C) 98.3 F (36.8 C) 98.1 F (36.7 C)  TempSrc: Oral Oral Oral Oral  SpO2: 92% 94% 96% 96%  Weight:      Height:        CBC:  Recent Labs  Lab 04/24/21 1059 04/26/21 0246 04/27/21 0411  WBC 8.0 8.1 9.7  NEUTROABS 6.1  --   --   HGB 12.6 12.4 12.4  HCT 37.4 36.5 37.1  MCV 86.0 86.1 86.1  PLT 248 254 144    Basic Metabolic Panel:  Recent Labs  Lab 04/25/21 0600 04/26/21 0246 04/27/21 0411  NA 132* 132* 135  K 4.1 4.0 3.9  CL 96* 96* 99  CO2 26 28 26   GLUCOSE 196* 149* 189*  BUN 21 28* 26*  CREATININE 0.92 1.13* 1.02*  CALCIUM 9.0 8.7* 8.6*  MG 1.5*  --   --     Lipid Panel:     Component Value Date/Time   CHOL 195 04/19/2021 0427   CHOL 233 (H) 09/20/2020 1132   CHOL 205 (H) 09/06/2013 1518   TRIG 221 (H) 04/19/2021 0427   TRIG 174 09/06/2013 1518   HDL 46 04/19/2021 0427   HDL 47 09/20/2020 1132   HDL 53 09/06/2013 1518   CHOLHDL 4.2 04/19/2021 0427   VLDL 44 (H) 04/19/2021 0427   VLDL 35 09/06/2013 1518   LDLCALC 105 (H) 04/19/2021 0427   LDLCALC 152 (H) 09/20/2020 1132   LDLCALC 117 (H) 09/06/2013 1518   HgbA1c:  Lab Results  Component Value Date   HGBA1C 9.3 (H) 04/19/2021      IMAGING  MR BRAIN WO CONTRAST MR ANGIO HEAD WO CONTRAST 04/18/2021 IMPRESSION:  1. Small acute infarct of the anterolateral right thalamus adjacent to the posterior limb of the right internal capsule.  2. Right PCA mid P1 segment occlusion. Loss of normal flow related enhancement in the right vertebral artery V4 segment.  MR BRAIN WO CONTRAST 04/19/2021 IMPRESSION:  1. Stable acute nonhemorrhagic infarct involving the lateral aspect of the right thalamus.  2. Additional acute nonhemorrhagic infarcts involving the more superior and medial right thalamus, the right occipital lobe, the left side of the splenium of the corpus callosum, and the right cerebellum.  3. Stable white matter disease. This likely reflects the sequela of chronic microvascular ischemia.    ECHOCARDIOGRAM COMPLETE 04/19/2021 IMPRESSIONS   1. Left ventricular ejection fraction, by estimation, is 70 to 75%. The left ventricle has hyperdynamic function. The left ventricle has no regional wall motion abnormalities. Left ventricular diastolic parameters are consistent with Grade I diastolic dysfunction (impaired relaxation). Elevated left atrial pressure.   2. Right ventricular systolic function is  normal. The right ventricular size is normal. Tricuspid regurgitation signal is inadequate for assessing PA pressure.   3. Left atrial size was mildly dilated.   4. The mitral valve is normal in structure. Mild mitral valve regurgitation. No evidence of mitral stenosis.   5. The aortic valve is normal in structure. Aortic valve regurgitation is not visualized. No aortic stenosis is present.   6. The inferior vena cava is normal in size with greater than 50% respiratory variability, suggesting right atrial pressure of 3 mmHg.   CT HEAD CODE STROKE WO CONTRAST 04/18/2021 IMPRESSION:  1. Normal CT appearance of the brain.  2. ASPECTS is 10/10.   CT ANGIO HEAD NECK W WO CM W PERF (CODE STROKE) 04/18/2021 IMPRESSION:  1. Occlusion of  the right posterior cerebral artery P1 segment with 13 mL area of ischemic penumbra in the right occipital lobe.  2. 50 % stenosis of the proximal right internal carotid artery secondary to mixed density atherosclerosis.  Aortic Atherosclerosis (ICD10-I70.0).   CT CORONARY 04/25/21  1. Coronary calcium score of 1904. This was 99th percentile for age, sex, and race matched control.  2. Normal coronary origin with right dominance.  3. CAD-RADS 4 Severe stenosis. (70-99% or > 50% left main). CT FFR is recommended and will be sent. Consider symptom-guided anti-ischemic pharmacotherapy as well as risk factor modification per guideline directed care.  4.  Aortic atherosclerosis noted.  5. 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  6. Mild to moderate mitral annular calcification noted.  ECG - SR rate 82 BPM. Possible old MI. (See cardiology reading for complete details)  PHYSICAL EXAM Blood pressure (!) 146/55, pulse 81, temperature 98.1 F (36.7 C), temperature source Oral, resp. rate 16, height 5\' 8"  (1.727 m), weight 99 kg, SpO2 96 %.  GENERAL: Pleasant middle-aged Caucasian lady she is awake and alert and responsive.  HEENT: This is normal.  ABDOMEN: soft  EXTREMITIES: No edema   BACK: Normal  SKIN: Normal by inspection.    MENTAL STATUS: Alert and oriented. Speech, language and cognition are generally intact. Judgment and insight normal.   CRANIAL NERVES: Pupils are equal, round and reactive to light and accomodation; extra ocular movements are full, there is no significant nystagmus; visual fields are full; there is flattening of the nasolabial fold on left side; tongue is midline  MOTOR: Direct strength is 5/5 but there is left hemiparesis with left upper extremity 3/5 and left lower extremity 4/5 strength with drift on both..  Significant weakness of left grip and intrinsic hand  muscles.  Right side is normal.  COORDINATION: There is significant dysmetria of the left upper extremity and left lower extremity.  SENSATION: There is reduced sensation to temperature light touch along the left upper extremity and left leg.  ASSESSMENT/PLAN Ms. RAJA VERHOEVEN is a 65 y.o. female with history of poorly controlled diabetes and hypertension, hyperlipidemia, obesity, anxiety, depression, presenting to [facility] with acute dizziness left-sided weakness, left-sided numbness, and possible visual deficits. She received IV tPA  via telemedicine at [place] [facility] 04/18/2021 and was found to have occlusion of the right P2/PCA and was taken to IR for successful thrombectomy. No awaiting CT surgery regarding bypass and surgical excision of mass.    Stroke: acute nonhemorrhagic lateral aspect of the right thalamus. Acute nonhemorrhagic infarcts involving the more superior and medial right thalamus, the right occipital lobe, the left side of the  splenium of the corpus callosum, and the right cerebellum - embolic - etiology unknown.  Resultant dysmetria involving the left side and left-sided numbness.  Code Stroke CT Head - Normal CT appearance of the brain. ASPECTS is 10/10.   CT head - not ordered  MRI head - 04/18/21 - Small acute infarct of the anterolateral right thalamus adjacent to the posterior limb of the right internal capsule.   MRI head - 04/19/21 - Stable acute nonhemorrhagic infarct involving the lateral aspect of the right thalamus. Additional acute nonhemorrhagic infarcts involving the more superior and medial right thalamus, the right occipital lobe, the left side of the splenium of the corpus callosum, and the right cerebellum. Stable white matter disease. This likely reflects the sequela of chronic microvascular ischemia.    MRA head - Right PCA mid P1 segment occlusion. Loss of normal flow related enhancement in the right vertebral artery V4 segment.  CTA H&N - 50  % stenosis of the proximal right internal carotid artery secondary to mixed density atherosclerosis.   CT Perfusion - Occlusion of the right posterior cerebral artery P1 segment with 13 mL area of ischemic penumbra in the right occipital lobe.   Carotid Doppler - CTA neck ordered - carotid dopplers not indicated.  2D Echo - EF 70 to 75%. No cardiac source of emboli identified.   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 No clot  [employer] Virus 2 - negative  LDL - 105  HgbA1c - 9.3  UDS - not ordered  VTE prophylaxis - SCDs Diet: Carb restricted  No antithrombotic or antiplatelet prior to admission, now on  aspirin 81 mg daily and Plavix 75 mg daily x 3 weeks   Patient will be counseled to be compliant with her antithrombotic medications  Ongoing aggressive stroke risk factor management  Therapy recommendations:  CIR recommended -  accepted with insurance pending  Disposition:  CIR pending  Hypertension  Home BP meds: Cozaar and HCTZ  Current BP meds: On home cozaar, normodyne prn  Stable, hypertensive   Permissive hypertension bp less than 180 . Long-term BP goal normotensive  Hyperlipidemia  Home Lipid lowering medication: none  LDL 105, goal < 70  Current lipid lowering medication: on Lipitor 40 mg  Continue statin at discharge  Diabetes  Home diabetic meds: Actos 45, Januvia 100, Glucotrol 5 mg,  Metformin 1000mg   bid  Current diabetic meds: insulin HgbA1c 9.3, goal < 7.0 Diabetes coordinator consult appreciated, will add Levimir 10units BID, Novolog meal time coverage of 3 units, SSI to be changed to  moderate scale  Other Stroke Risk Factors  Advanced age  Former cigarette smoker - quit  ETOH use, advised to drink no more than 1 alcoholic beverage per day.  Obesity, recommend weight loss, diet and exercise as appropriate   Other Active Problems,  Findings, Recommendations and/or Plan  Code status - Full Code  Aortic Atherosclerosis (ICD10-I70.0).  CKD - stage  3a - creatinine - 1.09 Cough: robitussin, CBC sent, monitor   Hospital day # 8  ATTENDING NOTE: I reviewed above note and agree with the assessment and plan. Pt was seen and examined.   Pt no acute event overnight. No complains. Neuro unchanged. Pending CIR. On ASA and plavix as well as lipitor 40. Pending CIR, will check with cardiology in am before CIR discharge. Also need check with cardiology about [facility] in  the setting of AV mass.   For detailed assessment and plan, please refer to above as I have made changes wherever appropriate.   Rosalin Hawking, MD PhD Stroke Neurology 04/27/2021 5:56 PM

## 2021-04-28 ENCOUNTER — Inpatient Hospital Stay (HOSPITAL_COMMUNITY): Payer: 59

## 2021-04-28 DIAGNOSIS — R059 Cough, unspecified: Secondary | ICD-10-CM

## 2021-04-28 DIAGNOSIS — I2510 Atherosclerotic heart disease of native coronary artery without angina pectoris: Secondary | ICD-10-CM

## 2021-04-28 DIAGNOSIS — R931 Abnormal findings on diagnostic imaging of heart and coronary circulation: Secondary | ICD-10-CM

## 2021-04-28 LAB — GLUCOSE, CAPILLARY
Glucose-Capillary: 123 mg/dL — ABNORMAL HIGH (ref 70–99)
Glucose-Capillary: 318 mg/dL — ABNORMAL HIGH (ref 70–99)
Glucose-Capillary: 233 mg/dL — ABNORMAL HIGH (ref 70–99)
Glucose-Capillary: 236 mg/dL — ABNORMAL HIGH (ref 70–99)

## 2021-04-28 LAB — SARS CORONAVIRUS 2 (TAT 6-24 HRS): SARS Coronavirus 2: NEGATIVE

## 2021-04-28 LAB — CBC
HCT: 39.3 % (ref 36.0–46.0)
Hemoglobin: 12.8 g/dL (ref 12.0–15.0)
MCH: 28.4 pg (ref 26.0–34.0)
MCHC: 32.6 g/dL (ref 30.0–36.0)
MCV: 87.3 fL (ref 80.0–100.0)
Platelets: 278 10*3/uL (ref 150–400)
RBC: 4.5 MIL/uL (ref 3.87–5.11)
RDW: 13.3 % (ref 11.5–15.5)
WBC: 10.8 10*3/uL — ABNORMAL HIGH (ref 4.0–10.5)
nRBC: 0 % (ref 0.0–0.2)

## 2021-04-28 LAB — BASIC METABOLIC PANEL
Anion gap: 9 (ref 5–15)
BUN: 16 mg/dL (ref 8–23)
CO2: 27 mmol/L (ref 22–32)
Calcium: 9 mg/dL (ref 8.9–10.3)
Chloride: 101 mmol/L (ref 98–111)
Creatinine, Ser: 0.94 mg/dL (ref 0.44–1.00)
GFR, Estimated: >60 mL/min (ref >60)
Glucose, Bld: 124 mg/dL — ABNORMAL HIGH (ref 70–99)
Potassium: 4.1 mmol/L (ref 3.5–5.1)
Sodium: 137 mmol/L (ref 135–145)

## 2021-04-28 IMAGING — DX DG CHEST 1V PORT
1 series · 1 of 1 positions shown · non-contrast
Comparison: [DATE]

CLINICAL DATA: Cough.

EXAM:
PORTABLE CHEST 1 VIEW

[chest ap]
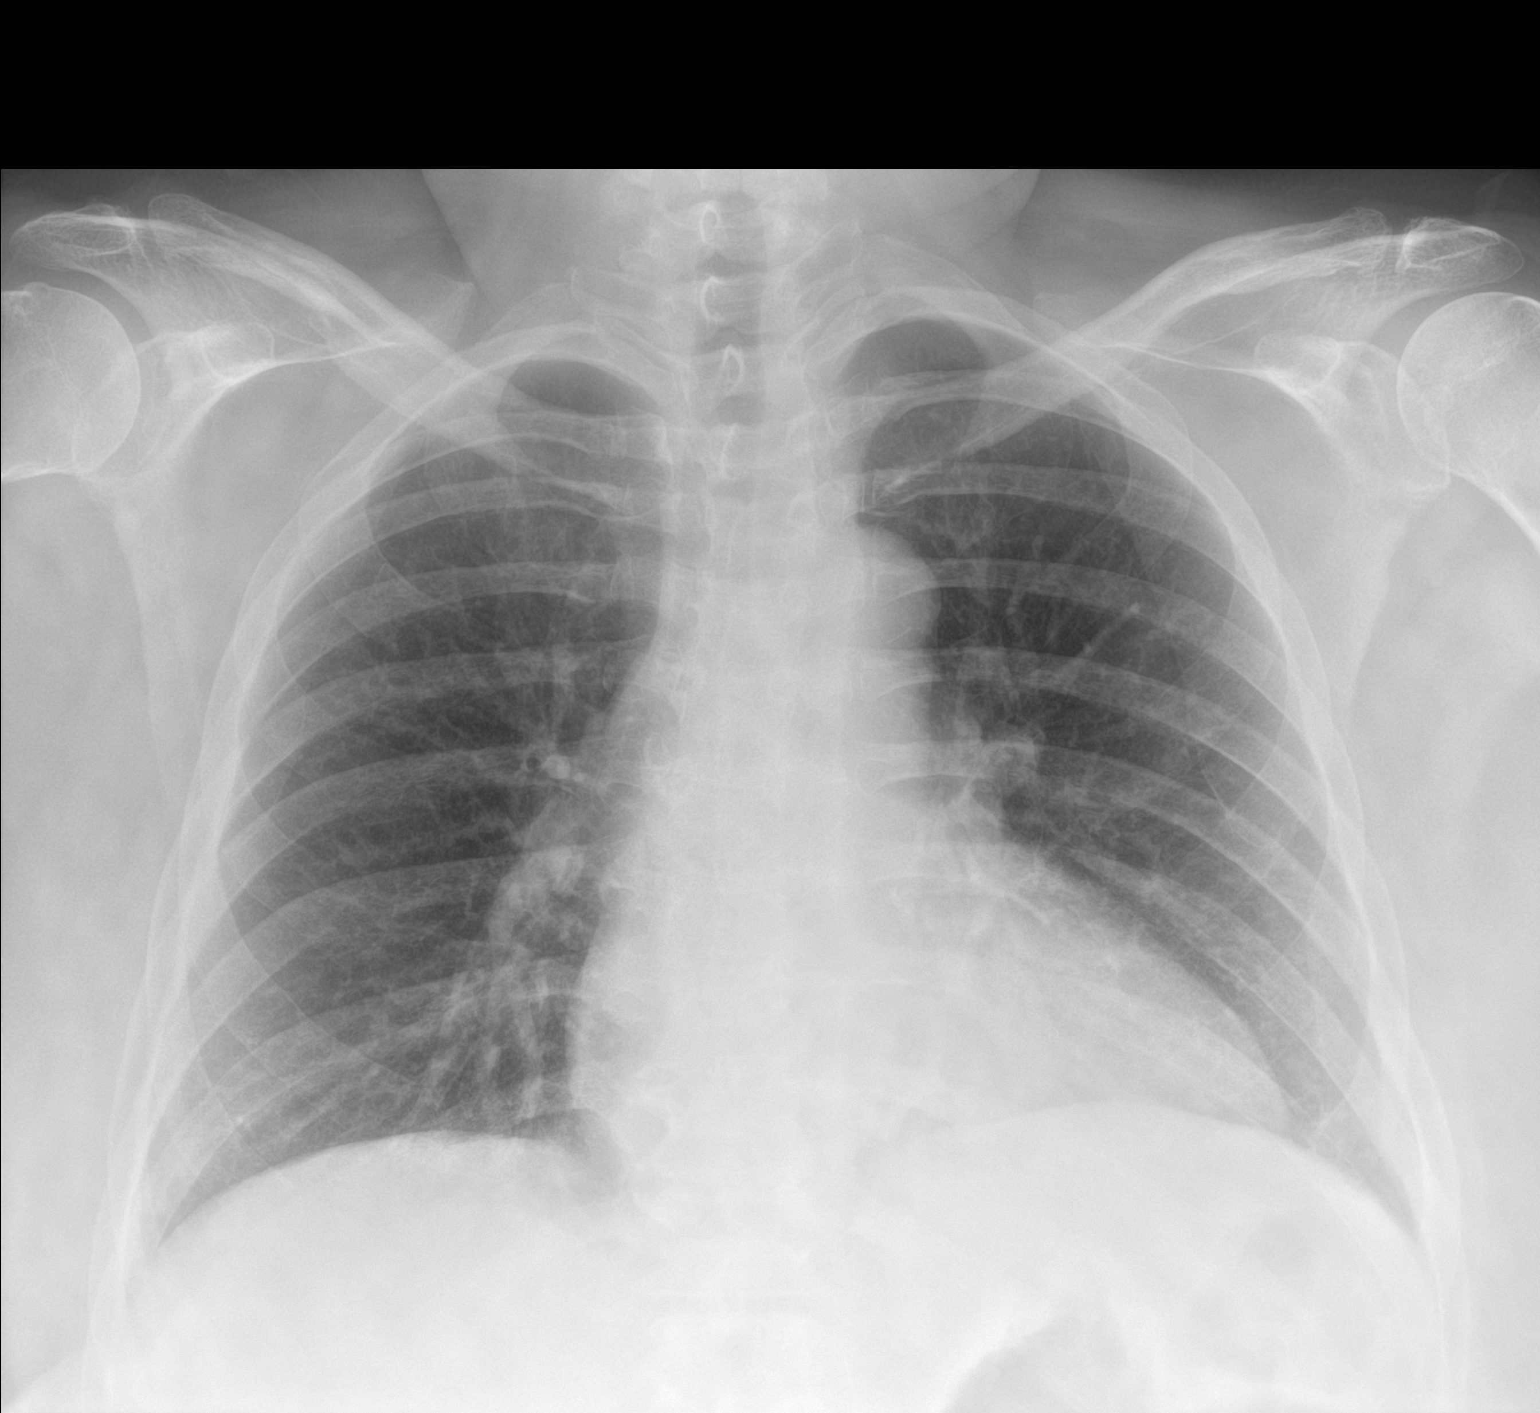

[1 of 1 positions shown; findings below may reference images not displayed]

FINDINGS: The lungs are clear without focal pneumonia, edema, pneumothorax or
pleural effusion. The cardiopericardial silhouette is within normal
limits for size. The visualized bony structures of the thorax show
no acute abnormality.
IMPRESSION: No active disease.

## 2021-04-28 NOTE — Progress Notes (Signed)
Inpatient Rehab Admissions Coordinator:    I have insurance approval and a bed available for pt to admit to CIR today. Dr. Erlinda Hong in agreement.  Will let pt/family and TOC team know.   Shann Medal, PT, DPT Admissions Coordinator [phone] 04/28/21  10:27 AM

## 2021-04-28 NOTE — Progress Notes (Signed)
Modified Barium Swallow Progress Note  Patient Details  Name: [name] MRN: [id number] Date of Birth: [birth year]  Today's Date: 04/28/2021  Modified Barium Swallow completed.  Full report located under Chart Review in the Imaging Section.  Brief recommendations include the following:  Clinical Impression  Pt demonstrates no aspiration or dysphagia though she did cough somewhat often during assessment, particularly at the end. When questioned about reflux, pt both denied it and told a story about having bad heartburn with certain foods. Esophageal sweep unrevealing. Reported findings to NP. Pt may continue a regular diet and thin liquids.   Swallow Evaluation Recommendations       SLP Diet Recommendations: Regular solids;Thin liquid   Liquid Administration via: Cup;Straw   Medication Administration: Whole meds with liquid   Supervision: Patient able to self feed   Compensations: Slow rate;Small sips/bites   Postural Changes: Remain semi-upright after after feeds/meals (Comment);Seated upright at 90 degrees   Oral Care Recommendations: Oral care BID        [name], Katherene Ponto 04/28/2021,2:41 PM

## 2021-04-28 NOTE — Progress Notes (Signed)
Physical Therapy Treatment Patient Details Name: [name] MRN: [id number] DOB: [birth year] Today's Date: 04/28/2021    History of Present Illness [name] is a 65 y.o. female presenting 5/6 due to L-sided weakness and numbness, who has a thalamic stroke on the right along with the MRA showing a possible right P1 occlusion. Pt now s/p thrombectomy on 5/7 with complete recanalization. PMHx; of poorly controlled diabetes and hypertension, hyperlipidemia, obesity, anxiety, depression.    PT Comments    Pt resting upon PT arrival to room, wakes easily and is motivated to participate in PT. Pt demonstrating improved tolerance for therapy today, performing multiple sit<>stands with pregait tasks. Pt very unsteady and fearful of falling in standing activity, requires frequent encouragement and at least SL support to feel supported. Pt continues to demonstrate LUE/LE incoordination and weakness, accompanied by L inattention requiring PT cues to keep L extremities safe during and post-transfer. CIR remains appropriate dispo, pt eager to get to CIR.    Follow Up Recommendations  CIR     Equipment Recommendations  None recommended by PT    Recommendations for Other Services       Precautions / Restrictions Precautions Precautions: Fall Restrictions Weight Bearing Restrictions: No    Mobility  Bed Mobility Overal bed mobility: Needs Assistance Bed Mobility: Rolling;Sidelying to Sit Rolling: Min assist Sidelying to sit: Mod assist       General bed mobility comments: min assist for roll to R for truncal translation initiated with head turn towards R, mod assist for sidelying to sit for trunk elevation and LE lowering over EOB. Very increased time and effort to scoot to EOB once sitting.    Transfers Overall transfer level: Needs assistance Equipment used: 1 person hand held assist Transfers: Squat Pivot Transfers;Sit to/from Stand Sit to Stand: Mod assist;From elevated  surface   Squat pivot transfers: Mod assist     General transfer comment: Mod assist for power up, rise, and steady. PT facilitation via posterior pelvis and LLE guarded closely to prevent buckling if needed. STS x4 from EOB, with focus on reaching midline standing, weight shifting L/R, stepping bilaterally. Pt with standing tolerance x45 seconds before needing to sit due to fatigue. Mod assist for squat pivot to R for lift over gap between bed/chair, hand and feet placement to facilitate pivot, positioning in recliner.  Ambulation/Gait                 Stairs             Wheelchair Mobility    Modified Rankin (Stroke Patients Only) Modified Rankin (Stroke Patients Only) Pre-Morbid Rankin Score: No symptoms Modified Rankin: Severe disability     Balance Overall balance assessment: Needs assistance Sitting-balance support: Feet supported;Single extremity supported Sitting balance-Leahy Scale: Fair     Standing balance support: Bilateral upper extremity supported;During functional activity Standing balance-Leahy Scale: Poor Standing balance comment: reliant on external assist                            Cognition Arousal/Alertness: Awake/alert Behavior During Therapy: WFL for tasks assessed/performed;Impulsive Overall Cognitive Status: Impaired/Different from baseline Area of Impairment: Attention;Following commands;Safety/judgement;Awareness;Problem solving;Memory                   Current Attention Level: Sustained Memory: Decreased short-term memory Following Commands: Follows one step commands consistently Safety/Judgement: Decreased awareness of safety;Decreased awareness of deficits Awareness: Emergent Problem Solving: Difficulty sequencing;Requires verbal cues;Requires  tactile cues General Comments: Pt resting upon arrival to room, wakes easily. Pt states "I've only been out of bed two times since I've been here" which is untrue as pt has  mobilized with PT/OT multiples times during stay. Pt requires step-by-step cuing for mobility tasks at this time.      Exercises      General Comments        Pertinent Vitals/Pain Pain Assessment: Faces Faces Pain Scale: No hurt Pain Intervention(s): Monitored during session    Home Living                      Prior Function            PT Goals (current goals can now be found in the care plan section) Acute Rehab PT Goals Patient Stated Goal: return to independence PT Goal Formulation: With patient Time For Goal Achievement: 05/03/21 Potential to Achieve Goals: Good Progress towards PT goals: Progressing toward goals    Frequency    Min 4X/week      PT Plan Current plan remains appropriate    Co-evaluation              AM-PAC PT "6 Clicks" Mobility   Outcome Measure  Help needed turning from your back to your side while in a flat bed without using bedrails?: A Little Help needed moving from lying on your back to sitting on the side of a flat bed without using bedrails?: A Lot Help needed moving to and from a bed to a chair (including a wheelchair)?: A Lot Help needed standing up from a chair using your arms (e.g., wheelchair or bedside chair)?: A Lot Help needed to walk in hospital room?: A Lot Help needed climbing 3-5 steps with a railing? : Total 6 Click Score: 12    End of Session Equipment Utilized During Treatment: Gait belt Activity Tolerance: Patient tolerated treatment well Patient left: in chair;with call bell/phone within reach;with chair alarm set Nurse Communication: Mobility status PT Visit Diagnosis: Other abnormalities of gait and mobility (R26.89);Ataxic gait (R26.0)     Time: 6314-9702 PT Time Calculation (min) (ACUTE ONLY): 26 min  Charges:  $Therapeutic Activity: 8-22 mins $Neuromuscular Re-education: 8-22 mins                     Stacie Glaze, PT DPT Acute Rehabilitation Services Pager [phone]  Office  [phone]    Roxine Caddy E Ruffin Pyo 04/28/2021, 3:14 PM

## 2021-04-28 NOTE — Progress Notes (Signed)
Inpatient Rehab Admissions Coordinator:   Admission on hold due to still pending covid test.  Will stop by pt's bedside to let her know and will continue to follow for possible admission tomorrow pending test results.   Shann Medal, PT, DPT Admissions Coordinator [phone] 04/28/21  3:43 PM

## 2021-04-28 NOTE — Progress Notes (Addendum)
Reports non productive cough today   STROKE TEAM PROGRESS NOTE    INTERVAL HISTORY No acute events. Tmax 99. Mild leukocytosis 9.7->10.8 Reports coughing quite a bit over the weekend. She does not associate it with eating. She denies, itchy or watery eyes, runny nose, postnasal drip, sinus congestion, shortness of breath, wheezing or dyspnea. Otherwise no other changes or symptoms of concern. She is anxious to get to rehab. Neurologically stable.  Brother at bedside.  RN reports patient coughing this am with food intake and without.  We discussed plan of care, coughing work up and plans for transfer to CIR. Questions answered.    OBJECTIVE Vitals:   04/27/21 1924 04/27/21 2335 04/28/21 0322 04/28/21 0752  BP: (!) 148/80 (!) 135/57 (!) 143/67 (!) 158/75  Pulse: 80 62 63 73  Resp: 16 20 16 18   Temp: 98.9 F (37.2 C) 98.2 F (36.8 C) 97.8 F (36.6 C) 98.4 F (36.9 C)  TempSrc: Oral Oral Oral Oral  SpO2: 94% 95% 93% 91%  Weight:      Height:        CBC:  Recent Labs  Lab 04/24/21 1059 04/26/21 0246 04/27/21 0411 04/28/21 0417  WBC 8.0   < > 9.7 10.8*  NEUTROABS 6.1  --   --   --   HGB 12.6   < > 12.4 12.8  HCT 37.4   < > 37.1 39.3  MCV 86.0   < > 86.1 87.3  PLT 248   < > 263 278   < > = values in this interval not displayed.    Basic Metabolic Panel:  Recent Labs  Lab 04/25/21 0600 04/26/21 0246 04/27/21 0411 04/28/21 0417  NA 132*   < > 135 137  K 4.1   < > 3.9 4.1  CL 96*   < > 99 101  CO2 26   < > 26 27  GLUCOSE 196*   < > 189* 124*  BUN 21   < > 26* 16  CREATININE 0.92   < > 1.02* 0.94  CALCIUM 9.0   < > 8.6* 9.0  MG 1.5*  --   --   --    < > = values in this interval not displayed.    Lipid Panel:     Component Value Date/Time   CHOL 195 04/19/2021 0427   CHOL 233 (H) 09/20/2020 1132   CHOL 205 (H) 09/06/2013 1518   TRIG 221 (H) 04/19/2021 0427   TRIG 174 09/06/2013 1518   HDL 46 04/19/2021 0427   HDL 47 09/20/2020 1132   HDL 53 09/06/2013  1518   CHOLHDL 4.2 04/19/2021 0427   VLDL 44 (H) 04/19/2021 0427   VLDL 35 09/06/2013 1518   LDLCALC 105 (H) 04/19/2021 0427   LDLCALC 152 (H) 09/20/2020 1132   LDLCALC 117 (H) 09/06/2013 1518   HgbA1c:  Lab Results  Component Value Date   HGBA1C 9.3 (H) 04/19/2021     IMAGING  MR BRAIN WO CONTRAST MR ANGIO HEAD WO CONTRAST 04/18/2021 IMPRESSION:  1. Small acute infarct of the anterolateral right thalamus adjacent to the posterior limb of the right internal capsule.  2. Right PCA mid P1 segment occlusion. Loss of normal flow related enhancement in the right vertebral artery V4 segment.  MR BRAIN WO CONTRAST 04/19/2021 IMPRESSION:  1. Stable acute nonhemorrhagic infarct involving the lateral aspect of the right thalamus.  2. Additional acute nonhemorrhagic infarcts involving the more superior and medial right thalamus, the right  occipital lobe, the left side of the splenium of the corpus callosum, and the right cerebellum.  3. Stable white matter disease. This likely reflects the sequela of chronic microvascular ischemia.    ECHOCARDIOGRAM COMPLETE 04/19/2021 IMPRESSIONS   1. Left ventricular ejection fraction, by estimation, is 70 to 75%. The left ventricle has hyperdynamic function. The left ventricle has no regional wall motion abnormalities. Left ventricular diastolic parameters are consistent with Grade I diastolic dysfunction (impaired relaxation). Elevated left atrial pressure.   2. Right ventricular systolic function is normal. The right ventricular size is normal. Tricuspid regurgitation signal is inadequate for assessing PA pressure.   3. Left atrial size was mildly dilated.   4. The mitral valve is normal in structure. Mild mitral valve regurgitation. No evidence of mitral stenosis.   5. The aortic valve is normal in structure. Aortic valve regurgitation is not visualized. No aortic stenosis is present.   6. The inferior vena cava is normal in size with greater than 50%  respiratory variability, suggesting right atrial pressure of 3 mmHg.   CT HEAD CODE STROKE WO CONTRAST 04/18/2021 IMPRESSION:  1. Normal CT appearance of the brain.  2. ASPECTS is 10/10.   CT ANGIO HEAD NECK W WO CM W PERF (CODE STROKE) 04/18/2021 IMPRESSION:  1. Occlusion of the right posterior cerebral artery P1 segment with 13 mL area of ischemic penumbra in the right occipital lobe.  2. 50 % stenosis of the proximal right internal carotid artery secondary to mixed density atherosclerosis.  Aortic Atherosclerosis (ICD10-I70.0).   CT CORONARY 04/25/21  1. Coronary calcium score of 1904. This was 99th percentile for age, sex, and race matched control.  2. Normal coronary origin with right dominance.  3. CAD-RADS 4 Severe stenosis. (70-99% or > 50% left main). CT FFR is recommended and will be sent. Consider symptom-guided anti-ischemic pharmacotherapy as well as risk factor modification per guideline directed care.  4.  Aortic atherosclerosis noted.  5. 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  6. Mild to moderate mitral annular calcification noted.  ECG - SR rate 82 BPM. Possible old MI. (See cardiology reading for complete details)  PHYSICAL EXAM Blood pressure (!) 158/75, pulse 73, temperature 98.4 F (36.9 C), temperature source Oral, resp. rate 18, height 5\' 8"  (1.727 m), weight 99 kg, SpO2 91 %.  GENERAL: 65 year old female sitting up in bed no apparent distress.   HEENT: This is normal.  RESP: No extra work of breathing  CV: RRR  ABDOMEN: soft, NTTP  EXTREMITIES: No edema   BACK: Normal  SKIN: Normal by inspection.    MENTAL STATUS: Alert and oriented x4. Speech, language and cognition are generally intact. Judgment and insight normal.   CRANIAL NERVES: Pupils are equal, round and reactive to light and accomodation; extra ocular movements are full, there is no  significant nystagmus; visual fields are full; there is flattening of the nasolabial fold on left side; tongue is midline  MOTOR: Direct strength is 5/5 but there is left hemiparesis with left upper extremity 3/5 and left lower extremity 4/5 strength with drift on both..  Significant weakness of left grip and intrinsic hand muscles.  Right side is normal.  COORDINATION: There is significant dysmetria of the left upper extremity and left lower extremity.  SENSATION: There is reduced sensation to temperature light touch along the left upper extremity and left leg.  ASSESSMENT/PLAN [name] is  a 65 y.o. female with history of poorly controlled diabetes and hypertension, hyperlipidemia, obesity, anxiety, depression, presenting to [facility] with acute dizziness left-sided weakness, left-sided numbness, and possible visual deficits. She received IV tPA  via telemedicine at [facility] 04/18/2021 and was found to have occlusion of the right P2/PCA and was taken to IR for thrombectomy. MT performed with combined stentriever and aspiration. Total of 1 pass with complete recanalization (TICI3). No hemorrhagic complication on flat panel head CT. [name].  Stroke: acute nonhemorrhagic lateral aspect of the right thalamus. Acute nonhemorrhagic infarcts involving the more superior and medial right thalamus, the right occipital lobe, the left side of the splenium of the corpus callosum, and the right cerebellum - embolic - etiology unknown.  Resultant dysmetria involving the left side and left-sided numbness.  Code Stroke CT Head - Normal CT appearance of the brain. ASPECTS is 10/10.   CT head - not ordered  MRI head - 04/18/21 - Small acute infarct of the anterolateral right thalamus adjacent to the posterior limb of the right internal capsule.   MRI head - 04/19/21 - Stable acute nonhemorrhagic infarct involving the lateral aspect of the right thalamus. Additional  acute nonhemorrhagic infarcts involving the more superior and medial right thalamus, the right occipital lobe, the left side of the splenium of the corpus callosum, and the right cerebellum. Stable white matter disease. This likely reflects the sequela of chronic microvascular ischemia.    MRA head - Right PCA mid P1 segment occlusion. Loss of normal flow related enhancement in the right vertebral artery V4 segment.  CTA H&N - 50 % stenosis of the proximal right internal carotid artery secondary to mixed density atherosclerosis.   CT Perfusion - Occlusion of the right posterior cerebral artery P1 segment with 13 mL area of ischemic penumbra in the right occipital lobe.   Carotid Doppler - CTA neck ordered - carotid dopplers not indicated.  2D Echo - EF 70 to 75%. No cardiac source of emboli identified.   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 No clot  [employer] Virus 2 - negative  LDL - 105  HgbA1c - 9.3  UDS - not ordered  VTE prophylaxis - SCDs Diet: Carb restricted  No antithrombotic or antiplatelet prior to admission, now on  aspirin 81 mg daily and Plavix 75 mg daily x 3 weeks. Per cardiology, Dr. Gasper Sells, continue DAPT until cardiac surgery. Plavix will be held in the future pending further planning closer to surgery.    Patient will be counseled to be compliant with her antithrombotic medications  Ongoing aggressive stroke risk factor management  Therapy recommendations:  CIR recommended -  accepted with insurance pending  Disposition:  CIR pending  Hypertension  Home BP meds: Cozaar and HCTZ  Current BP meds: On home cozaar, normodyne prn  Stable, hypertensive   Permissive hypertension bp less than 180 . Long-term BP goal normotensive  Hyperlipidemia  Home Lipid lowering medication: none  LDL 105, goal < 70  Current lipid lowering medication: will add Lipitor  40 mg daily  Continue statin at discharge  Diabetes  Home diabetic meds: Actos 45, Januvia 100, Glucotrol 5 mg,  Metformin 1000mg   bid  Current diabetic meds: insulin  HgbA1c 9.3, goal < 7.0 Recent Labs    04/27/21 2118 04/28/21 0639 04/28/21 1125  GLUCAP 88 123* 318*  Diabetes  coordinator consult appreciated, will add Levimir 10units BID, Novolog meal time coverage of 3 units, SSI to be changed to  moderate scale  Cough  Coughing for a few days, low grade temperature elevation 99 x1 yesterday. One point elevation in WBC 9.7->10.8.  Feels ok otherwise.   Resp panel/Covid test Pending  CXR: no acute process  SLP re-eval today: no signs of aspiration  On protonix   Other Stroke Risk Factors  Advanced age  Former cigarette smoker - quit  ETOH use, advised to drink no more than 1 alcoholic beverage per day.  Obesity, recommend weight loss, diet and exercise as appropriate   Other Active Problems, Findings, Recommendations and/or Plan  Code status - Full Code  Aortic Atherosclerosis (ICD10-I70.0).  CKD - stage  3a - creatinine - 1.09 Cough: robitussin, CBC sent, monitor    Hospital day # 9 Delila A Bailey-Modzik, NP-C  ATTENDING NOTE: I reviewed above note and agree with the assessment and plan. Pt was seen and examined.   Patient no acute event overnight, neurological stable.  However, per RN, patient still has frequent coughing not sure whether related to swallowing.  Therefore, n.p.o. started, had speech therapist reevaluation, put patient back on diet with thin liquid.  Given coughing, repeat COVID test before discharge to CIR.  Currently COVID testing still pending, plan to discharge to CIR tomorrow if COVID test negative.  Cardiology work-up completed, no further testing planned.  Discussed with cardiology, no plan for anticoagulation, recommend DAPT on discharge.  For detailed assessment and plan, please refer to above as I have made changes wherever  appropriate.   Rosalin Hawking, MD PhD Stroke Neurology 04/28/2021 4:00 PM

## 2021-04-29 ENCOUNTER — Other Ambulatory Visit: Payer: Self-pay

## 2021-04-29 ENCOUNTER — Inpatient Hospital Stay (HOSPITAL_COMMUNITY)
Admission: RE | Admit: 2021-04-29 | Discharge: 2021-05-27 | DRG: 057 | Disposition: A | Discharge status: Home Health | Payer: 59 | Source: Intra-hospital | Attending: Physical Medicine & Rehabilitation | Admitting: Physical Medicine & Rehabilitation

## 2021-04-29 ENCOUNTER — Encounter (HOSPITAL_COMMUNITY): Payer: Self-pay | Admitting: Physical Medicine & Rehabilitation

## 2021-04-29 DIAGNOSIS — F3341 Major depressive disorder, recurrent, in partial remission: Secondary | ICD-10-CM | POA: Diagnosis present

## 2021-04-29 DIAGNOSIS — E871 Hypo-osmolality and hyponatremia: Secondary | ICD-10-CM | POA: Diagnosis not present

## 2021-04-29 DIAGNOSIS — I639 Cerebral infarction, unspecified: Secondary | ICD-10-CM | POA: Diagnosis not present

## 2021-04-29 DIAGNOSIS — E669 Obesity, unspecified: Secondary | ICD-10-CM | POA: Diagnosis present

## 2021-04-29 DIAGNOSIS — M545 Low back pain, unspecified: Secondary | ICD-10-CM | POA: Diagnosis not present

## 2021-04-29 DIAGNOSIS — N179 Acute kidney failure, unspecified: Secondary | ICD-10-CM | POA: Diagnosis not present

## 2021-04-29 DIAGNOSIS — E1169 Type 2 diabetes mellitus with other specified complication: Secondary | ICD-10-CM | POA: Diagnosis not present

## 2021-04-29 DIAGNOSIS — Z833 Family history of diabetes mellitus: Secondary | ICD-10-CM

## 2021-04-29 DIAGNOSIS — Z8249 Family history of ischemic heart disease and other diseases of the circulatory system: Secondary | ICD-10-CM

## 2021-04-29 DIAGNOSIS — Z79899 Other long term (current) drug therapy: Secondary | ICD-10-CM

## 2021-04-29 DIAGNOSIS — R059 Cough, unspecified: Secondary | ICD-10-CM | POA: Diagnosis not present

## 2021-04-29 DIAGNOSIS — K5901 Slow transit constipation: Secondary | ICD-10-CM

## 2021-04-29 DIAGNOSIS — F419 Anxiety disorder, unspecified: Secondary | ICD-10-CM | POA: Diagnosis present

## 2021-04-29 DIAGNOSIS — E785 Hyperlipidemia, unspecified: Secondary | ICD-10-CM | POA: Diagnosis not present

## 2021-04-29 DIAGNOSIS — Z7984 Long term (current) use of oral hypoglycemic drugs: Secondary | ICD-10-CM

## 2021-04-29 DIAGNOSIS — K219 Gastro-esophageal reflux disease without esophagitis: Secondary | ICD-10-CM | POA: Diagnosis present

## 2021-04-29 DIAGNOSIS — Z87891 Personal history of nicotine dependence: Secondary | ICD-10-CM | POA: Diagnosis not present

## 2021-04-29 DIAGNOSIS — E875 Hyperkalemia: Secondary | ICD-10-CM | POA: Diagnosis not present

## 2021-04-29 DIAGNOSIS — D72829 Elevated white blood cell count, unspecified: Secondary | ICD-10-CM | POA: Diagnosis not present

## 2021-04-29 DIAGNOSIS — I10 Essential (primary) hypertension: Secondary | ICD-10-CM

## 2021-04-29 DIAGNOSIS — Z6833 Body mass index (BMI) 33.0-33.9, adult: Secondary | ICD-10-CM | POA: Diagnosis not present

## 2021-04-29 DIAGNOSIS — I6381 Other cerebral infarction due to occlusion or stenosis of small artery: Secondary | ICD-10-CM | POA: Diagnosis present

## 2021-04-29 DIAGNOSIS — B964 Proteus (mirabilis) (morganii) as the cause of diseases classified elsewhere: Secondary | ICD-10-CM | POA: Diagnosis not present

## 2021-04-29 DIAGNOSIS — G8929 Other chronic pain: Secondary | ICD-10-CM | POA: Diagnosis present

## 2021-04-29 DIAGNOSIS — M1990 Unspecified osteoarthritis, unspecified site: Secondary | ICD-10-CM | POA: Diagnosis present

## 2021-04-29 DIAGNOSIS — K5900 Constipation, unspecified: Secondary | ICD-10-CM | POA: Diagnosis present

## 2021-04-29 DIAGNOSIS — I69354 Hemiplegia and hemiparesis following cerebral infarction affecting left non-dominant side: Secondary | ICD-10-CM | POA: Diagnosis not present

## 2021-04-29 DIAGNOSIS — N182 Chronic kidney disease, stage 2 (mild): Secondary | ICD-10-CM

## 2021-04-29 DIAGNOSIS — I358 Other nonrheumatic aortic valve disorders: Secondary | ICD-10-CM | POA: Diagnosis not present

## 2021-04-29 DIAGNOSIS — G2581 Restless legs syndrome: Secondary | ICD-10-CM | POA: Diagnosis not present

## 2021-04-29 DIAGNOSIS — M79605 Pain in left leg: Secondary | ICD-10-CM | POA: Diagnosis not present

## 2021-04-29 DIAGNOSIS — Z820 Family history of epilepsy and other diseases of the nervous system: Secondary | ICD-10-CM | POA: Diagnosis not present

## 2021-04-29 DIAGNOSIS — Z880 Allergy status to penicillin: Secondary | ICD-10-CM | POA: Diagnosis not present

## 2021-04-29 DIAGNOSIS — Z888 Allergy status to other drugs, medicaments and biological substances status: Secondary | ICD-10-CM | POA: Diagnosis not present

## 2021-04-29 DIAGNOSIS — Z800 Family history of malignant neoplasm of digestive organs: Secondary | ICD-10-CM | POA: Diagnosis not present

## 2021-04-29 DIAGNOSIS — M79662 Pain in left lower leg: Secondary | ICD-10-CM | POA: Diagnosis not present

## 2021-04-29 DIAGNOSIS — I69322 Dysarthria following cerebral infarction: Secondary | ICD-10-CM | POA: Diagnosis not present

## 2021-04-29 DIAGNOSIS — E1122 Type 2 diabetes mellitus with diabetic chronic kidney disease: Secondary | ICD-10-CM

## 2021-04-29 DIAGNOSIS — Z96641 Presence of right artificial hip joint: Secondary | ICD-10-CM | POA: Diagnosis present

## 2021-04-29 DIAGNOSIS — N390 Urinary tract infection, site not specified: Secondary | ICD-10-CM | POA: Diagnosis not present

## 2021-04-29 DIAGNOSIS — E1165 Type 2 diabetes mellitus with hyperglycemia: Secondary | ICD-10-CM

## 2021-04-29 DIAGNOSIS — R7309 Other abnormal glucose: Secondary | ICD-10-CM

## 2021-04-29 HISTORY — DX: Cerebral infarction, unspecified: I63.9

## 2021-04-29 HISTORY — DX: Other cerebral infarction due to occlusion or stenosis of small artery: I63.81

## 2021-04-29 LAB — CBC
HCT: 36.1 % (ref 36.0–46.0)
Hemoglobin: 12.1 g/dL (ref 12.0–15.0)
MCH: 29.2 pg (ref 26.0–34.0)
MCHC: 33.5 g/dL (ref 30.0–36.0)
MCV: 87 fL (ref 80.0–100.0)
Platelets: 308 10*3/uL (ref 150–400)
RBC: 4.15 MIL/uL (ref 3.87–5.11)
RDW: 13.2 % (ref 11.5–15.5)
WBC: 11.9 10*3/uL — ABNORMAL HIGH (ref 4.0–10.5)
nRBC: 0 % (ref 0.0–0.2)

## 2021-04-29 LAB — BASIC METABOLIC PANEL
Anion gap: 10 (ref 5–15)
BUN: 13 mg/dL (ref 8–23)
CO2: 27 mmol/L (ref 22–32)
Calcium: 8.8 mg/dL — ABNORMAL LOW (ref 8.9–10.3)
Chloride: 97 mmol/L — ABNORMAL LOW (ref 98–111)
Creatinine, Ser: 0.9 mg/dL (ref 0.44–1.00)
GFR, Estimated: >60 mL/min (ref >60)
Glucose, Bld: 174 mg/dL — ABNORMAL HIGH (ref 70–99)
Potassium: 4 mmol/L (ref 3.5–5.1)
Sodium: 134 mmol/L — ABNORMAL LOW (ref 135–145)

## 2021-04-29 LAB — GLUCOSE, CAPILLARY
Glucose-Capillary: 190 mg/dL — ABNORMAL HIGH (ref 70–99)
Glucose-Capillary: 238 mg/dL — ABNORMAL HIGH (ref 70–99)
Glucose-Capillary: 309 mg/dL — ABNORMAL HIGH (ref 70–99)
Glucose-Capillary: 162 mg/dL — ABNORMAL HIGH (ref 70–99)

## 2021-04-29 MED ORDER — SERTRALINE HCL 100 MG PO TABS
100.0000 mg | ORAL_TABLET | Freq: Every day | ORAL | Status: DC
  Administered 2021-04-30 – 2021-05-27 (×28): 100 mg via ORAL
  Filled 2021-04-29 (×28): qty 1

## 2021-04-29 MED ORDER — ASPIRIN 81 MG PO CHEW: 81.0000 mg | CHEWABLE_TABLET | Freq: Every day | ORAL | Status: DC

## 2021-04-29 MED ORDER — INSULIN ASPART 100 UNIT/ML IJ SOLN
0.0000 [IU] | Freq: Three times a day (TID) | INTRAMUSCULAR | Status: DC
  Administered 2021-04-29: 11 [IU] via SUBCUTANEOUS
  Administered 2021-04-30: 2 [IU] via SUBCUTANEOUS
  Administered 2021-04-30: 3 [IU] via SUBCUTANEOUS
  Administered 2021-04-30 – 2021-05-01 (×2): 2 [IU] via SUBCUTANEOUS
  Administered 2021-05-01 (×2): 3 [IU] via SUBCUTANEOUS
  Administered 2021-05-02: 8 [IU] via SUBCUTANEOUS
  Administered 2021-05-02: 5 [IU] via SUBCUTANEOUS
  Administered 2021-05-03: 11 [IU] via SUBCUTANEOUS
  Administered 2021-05-03 – 2021-05-05 (×3): 2 [IU] via SUBCUTANEOUS
  Administered 2021-05-06: 3 [IU] via SUBCUTANEOUS
  Administered 2021-05-06 – 2021-05-08 (×4): 2 [IU] via SUBCUTANEOUS
  Administered 2021-05-09: 8 [IU] via SUBCUTANEOUS
  Administered 2021-05-10 – 2021-05-12 (×5): 2 [IU] via SUBCUTANEOUS
  Administered 2021-05-13: 3 [IU] via SUBCUTANEOUS
  Administered 2021-05-13 – 2021-05-14 (×3): 2 [IU] via SUBCUTANEOUS
  Administered 2021-05-14: 3 [IU] via SUBCUTANEOUS
  Administered 2021-05-15 – 2021-05-17 (×8): 2 [IU] via SUBCUTANEOUS
  Administered 2021-05-18: 3 [IU] via SUBCUTANEOUS
  Administered 2021-05-19 – 2021-05-21 (×4): 2 [IU] via SUBCUTANEOUS
  Administered 2021-05-21: 3 [IU] via SUBCUTANEOUS
  Administered 2021-05-22 – 2021-05-27 (×6): 2 [IU] via SUBCUTANEOUS

## 2021-04-29 MED ORDER — LOSARTAN POTASSIUM 50 MG PO TABS
25.0000 mg | ORAL_TABLET | Freq: Two times a day (BID) | ORAL | Status: DC
  Administered 2021-04-29 – 2021-05-06 (×14): 25 mg via ORAL
  Filled 2021-04-29 (×14): qty 1

## 2021-04-29 MED ORDER — INSULIN ASPART 100 UNIT/ML IJ SOLN: 5.0000 [IU] | Freq: Three times a day (TID) | INTRAMUSCULAR | 11 refills | Status: DC

## 2021-04-29 MED ORDER — CLOPIDOGREL BISULFATE 75 MG PO TABS
75.0000 mg | ORAL_TABLET | Freq: Every day | ORAL | Status: DC
  Administered 2021-04-30 – 2021-05-27 (×28): 75 mg via ORAL
  Filled 2021-04-29 (×28): qty 1

## 2021-04-29 MED ORDER — PANTOPRAZOLE SODIUM 40 MG PO TBEC
40.0000 mg | DELAYED_RELEASE_TABLET | Freq: Every day | ORAL | Status: DC
  Administered 2021-04-29 – 2021-05-26 (×28): 40 mg via ORAL
  Filled 2021-04-29 (×28): qty 1

## 2021-04-29 MED ORDER — BISACODYL 10 MG RE SUPP: 10.0000 mg | Freq: Every day | RECTAL | Status: DC | PRN

## 2021-04-29 MED ORDER — MELATONIN 3 MG PO TABS: 3.0000 mg | ORAL_TABLET | Freq: Every evening | ORAL | 0 refills | Status: DC | PRN

## 2021-04-29 MED ORDER — BENZONATATE 100 MG PO CAPS
100.0000 mg | ORAL_CAPSULE | Freq: Three times a day (TID) | ORAL | Status: DC | PRN
  Administered 2021-05-04: 100 mg via ORAL
  Filled 2021-04-29: qty 1

## 2021-04-29 MED ORDER — DM-GUAIFENESIN ER 30-600 MG PO TB12
1.0000 | ORAL_TABLET | Freq: Two times a day (BID) | ORAL | Status: DC
  Administered 2021-04-29 – 2021-05-05 (×12): 1 via ORAL
  Filled 2021-04-29 (×12): qty 1

## 2021-04-29 MED ORDER — EXERCISE FOR HEART AND HEALTH BOOK
Freq: Once | Status: AC
  Filled 2021-04-29: qty 1

## 2021-04-29 MED ORDER — FLEET ENEMA 7-19 GM/118ML RE ENEM: 1.0000 | ENEMA | Freq: Every day | RECTAL | 0 refills | Status: DC | PRN

## 2021-04-29 MED ORDER — ACETAMINOPHEN 650 MG RE SUPP: 650.0000 mg | RECTAL | Status: DC | PRN

## 2021-04-29 MED ORDER — ATORVASTATIN CALCIUM 40 MG PO TABS: 40.0000 mg | ORAL_TABLET | Freq: Every day | ORAL | Status: DC

## 2021-04-29 MED ORDER — LIVING WELL WITH DIABETES BOOK
Freq: Once | Status: AC
  Filled 2021-04-29: qty 1

## 2021-04-29 MED ORDER — ATORVASTATIN CALCIUM 40 MG PO TABS
40.0000 mg | ORAL_TABLET | Freq: Every day | ORAL | Status: DC
  Administered 2021-04-30 – 2021-05-27 (×28): 40 mg via ORAL
  Filled 2021-04-29 (×28): qty 1

## 2021-04-29 MED ORDER — ACETAMINOPHEN 325 MG PO TABS: 650.0000 mg | ORAL_TABLET | ORAL | Status: DC | PRN

## 2021-04-29 MED ORDER — BLOOD PRESSURE CONTROL BOOK
Freq: Once | Status: AC
  Filled 2021-04-29: qty 1

## 2021-04-29 MED ORDER — SENNOSIDES-DOCUSATE SODIUM 8.6-50 MG PO TABS
2.0000 | ORAL_TABLET | Freq: Every day | ORAL | Status: DC
  Administered 2021-04-29 – 2021-05-26 (×28): 2 via ORAL
  Filled 2021-04-29 (×28): qty 2

## 2021-04-29 MED ORDER — INSULIN DETEMIR 100 UNIT/ML ~~LOC~~ SOLN
12.0000 [IU] | Freq: Two times a day (BID) | SUBCUTANEOUS | Status: DC
  Administered 2021-04-29 – 2021-05-12 (×26): 12 [IU] via SUBCUTANEOUS
  Filled 2021-04-29 (×27): qty 0.12

## 2021-04-29 MED ORDER — SENNOSIDES-DOCUSATE SODIUM 8.6-50 MG PO TABS: 1.0000 | ORAL_TABLET | Freq: Every evening | ORAL | Status: DC | PRN

## 2021-04-29 MED ORDER — INSULIN ASPART 100 UNIT/ML IJ SOLN: 0.0000 [IU] | Freq: Every day | INTRAMUSCULAR | 11 refills | Status: DC

## 2021-04-29 MED ORDER — METFORMIN HCL 500 MG PO TABS
500.0000 mg | ORAL_TABLET | Freq: Two times a day (BID) | ORAL | Status: DC
  Administered 2021-04-29 – 2021-05-02 (×6): 500 mg via ORAL
  Filled 2021-04-29 (×6): qty 1

## 2021-04-29 MED ORDER — GUAIFENESIN 100 MG/5ML PO SOLN
5.0000 mL | ORAL | 0 refills | Status: DC | PRN
  Filled 2021-04-29: qty 236, 8d supply, fill #0

## 2021-04-29 MED ORDER — CLOPIDOGREL BISULFATE 75 MG PO TABS: 75.0000 mg | ORAL_TABLET | Freq: Every day | ORAL | Status: DC

## 2021-04-29 MED ORDER — INSULIN ASPART 100 UNIT/ML IJ SOLN
5.0000 [IU] | Freq: Three times a day (TID) | INTRAMUSCULAR | Status: DC
  Administered 2021-04-29 – 2021-05-12 (×30): 5 [IU] via SUBCUTANEOUS

## 2021-04-29 MED ORDER — PANTOPRAZOLE SODIUM 40 MG PO TBEC: 40.0000 mg | DELAYED_RELEASE_TABLET | Freq: Every day | ORAL | Status: DC

## 2021-04-29 MED ORDER — ASPIRIN 81 MG PO CHEW
81.0000 mg | CHEWABLE_TABLET | Freq: Every day | ORAL | Status: DC
  Administered 2021-04-30 – 2021-05-27 (×28): 81 mg via ORAL
  Filled 2021-04-29 (×28): qty 1

## 2021-04-29 MED ORDER — MELATONIN 3 MG PO TABS
3.0000 mg | ORAL_TABLET | Freq: Every evening | ORAL | Status: DC | PRN
  Administered 2021-04-29 – 2021-05-25 (×24): 3 mg via ORAL
  Filled 2021-04-29 (×24): qty 1

## 2021-04-29 MED ORDER — SENNOSIDES-DOCUSATE SODIUM 8.6-50 MG PO TABS
1.0000 | ORAL_TABLET | Freq: Every evening | ORAL | Status: DC | PRN
Start: 2021-04-29 — End: 2021-05-27

## 2021-04-29 MED ORDER — INSULIN DETEMIR 100 UNIT/ML ~~LOC~~ SOLN: 12.0000 [IU] | Freq: Two times a day (BID) | SUBCUTANEOUS | 11 refills | Status: DC

## 2021-04-29 MED ORDER — ACETAMINOPHEN 160 MG/5ML PO SOLN: 650.0000 mg | ORAL | Status: DC | PRN

## 2021-04-29 MED ORDER — INSULIN ASPART 100 UNIT/ML IJ SOLN: 0.0000 [IU] | Freq: Three times a day (TID) | INTRAMUSCULAR | 11 refills | Status: DC

## 2021-04-29 MED ORDER — ACETAMINOPHEN 325 MG PO TABS
650.0000 mg | ORAL_TABLET | ORAL | Status: DC | PRN
  Administered 2021-04-29 – 2021-05-27 (×55): 650 mg via ORAL
  Filled 2021-04-29 (×60): qty 2

## 2021-04-29 NOTE — Progress Notes (Signed)
Patient arrived from 3W via bed. Patient appears alert and denies pain.

## 2021-04-29 NOTE — Plan of Care (Signed)
  Problem: Education: Goal: Knowledge of General Education information will improve Description: Including pain rating scale, medication(s)/side effects and non-pharmacologic comfort measures Outcome: Progressing   Problem: Health Behavior/Discharge Planning: Goal: Ability to manage health-related needs will improve Outcome: Progressing   Problem: Clinical Measurements: Goal: Ability to maintain clinical measurements within normal limits will improve Outcome: Progressing Goal: Will remain free from infection Outcome: Progressing Goal: Diagnostic test results will improve Outcome: Progressing Goal: Respiratory complications will improve Outcome: Progressing Goal: Cardiovascular complication will be avoided Outcome: Progressing   Problem: Activity: Goal: Risk for activity intolerance will decrease Outcome: Progressing   Problem: Nutrition: Goal: Adequate nutrition will be maintained Outcome: Progressing   Problem: Coping: Goal: Level of anxiety will decrease Outcome: Progressing   Problem: Elimination: Goal: Will not experience complications related to bowel motility Outcome: Progressing Goal: Will not experience complications related to urinary retention Outcome: Progressing   Problem: Pain Managment: Goal: General experience of comfort will improve Outcome: Progressing   Problem: Safety: Goal: Ability to remain free from injury will improve Outcome: Progressing   Problem: Skin Integrity: Goal: Risk for impaired skin integrity will decrease Outcome: Progressing   Problem: Education: Goal: Knowledge of disease or condition will improve Outcome: Progressing Goal: Knowledge of secondary prevention will improve Outcome: Progressing Goal: Knowledge of patient specific risk factors addressed and post discharge goals established will improve Outcome: Progressing Goal: Individualized Educational Video(s) Outcome: Progressing   Problem: Education: Goal: Knowledge  of disease or condition will improve Outcome: Progressing Goal: Knowledge of secondary prevention will improve Outcome: Progressing Goal: Knowledge of patient specific risk factors addressed and post discharge goals established will improve Outcome: Progressing Goal: Individualized Educational Video(s) Outcome: Progressing   Problem: Coping: Goal: Will verbalize positive feelings about self Outcome: Progressing Goal: Will identify appropriate support needs Outcome: Progressing   Problem: Health Behavior/Discharge Planning: Goal: Ability to manage health-related needs will improve Outcome: Progressing   Problem: Self-Care: Goal: Ability to participate in self-care as condition permits will improve Outcome: Progressing Goal: Verbalization of feelings and concerns over difficulty with self-care will improve Outcome: Progressing Goal: Ability to communicate needs accurately will improve Outcome: Progressing   Problem: Nutrition: Goal: Risk of aspiration will decrease Outcome: Progressing Goal: Dietary intake will improve Outcome: Progressing   Problem: Ischemic Stroke/TIA Tissue Perfusion: Goal: Complications of ischemic stroke/TIA will be minimized Outcome: Progressing   

## 2021-04-29 NOTE — Progress Notes (Signed)
Inpatient Diabetes Program Recommendations  AACE/ADA: New Consensus Statement on Inpatient Glycemic Control (2015)  Target Ranges:  Prepandial:   less than 140 mg/dL      Peak postprandial:   less than 180 mg/dL (1-2 hours)      Critically ill patients:  140 - 180 mg/dL   Lab Results  Component Value Date   GLUCAP 190 (H) 04/29/2021   HGBA1C 9.3 (H) 04/19/2021    Review of Glycemic Control Results for JAMIELEE, MCHALE (MRN [id number]) as of 04/29/2021 11:03  Ref. Range 04/28/2021 06:39 04/28/2021 11:25 04/28/2021 16:17 04/28/2021 21:41 04/29/2021 06:07  Glucose-Capillary Latest Ref Range: 70 - 99 mg/dL 123 (H) 318 (H) 233 (H) 236 (H) 190 (H)    Inpatient Diabetes Program Recommendations:    -Increase Novolog meal coverage to 7 units tid if eats 50%  Thank you, Nani Gasser. Delfino Friesen, RN, MSN, CDE  Diabetes Coordinator Inpatient Glycemic Control Team Team Pager [phone] (8am-5pm) 04/29/2021 11:07 AM

## 2021-04-29 NOTE — H&P (Signed)
Expand All Collapse All         Physical Medicine and Rehabilitation Admission H&P       HPI: [name] is a 65 year old right-handed female with history of uncontrolled diabetes mellitus, hypertension, hyperlipidemia, remote tobacco abuse, obesity with BMI 33.45, anxiety/depression.  Per chart review patient lives alone.  Independent prior to admission and still working at [employer].  1 level home 3 steps to entry.  She plans to stay with her daughter and son-in-law on discharge.  Presented 04/19/2021 with acute onset of left-sided weakness and poor balance as well as dizziness.  CT/MRI as well as MRA showed small acute infarct of the anterior lateral right thalamus adjacent to the posterior limb of the right internal capsule.  Right PCA and P1 segment occlusion.  Patient did receive tPA.  Admission chemistries unremarkable except sodium 133 chloride 96 glucose 272 BUN 25 creatinine 1.09 hemoglobin A1c 9.3.  Patient did undergo thrombectomy 04/19/2021 with complete recanalization per interventional radiology.  Echocardiogram with ejection fraction of 70 to 75% no wall motion abnormalities grade 1 diastolic dysfunction.  Currently maintained on aspirin 81 mg daily and Plavix 75 mg daily for CVA prophylaxis.  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  No clot./CT coronary morphology and loop recorder placement 04/24/2021.  Patient did develop a bit of a nonproductive cough chest x-ray showed no active disease and SARS coronavirus negative.  Follow-up swallow study completed and maintained on a regular diet.   Due to patient's left-sided weakness and decreased functional mobility she was admitted for a comprehensive rehab program.   Review of Systems  Constitutional: Negative for chills and fever.  HENT: Negative for hearing loss.   Eyes: Negative for blurred vision and  double vision.  Respiratory: Negative for cough and shortness of breath.   Cardiovascular: Negative for chest pain, palpitations and leg swelling.  Gastrointestinal: Positive for constipation. Negative for heartburn, nausea and vomiting.       GERD  Genitourinary: Negative for dysuria, flank pain and hematuria.       Stress incontinence  Musculoskeletal: Positive for myalgias.  Skin: Negative for rash.  Neurological: Positive for sensory change, speech change and weakness.  Psychiatric/Behavioral: Positive for depression.       Anxiety  All other systems reviewed and are negative.   Past Medical History:  Diagnosis Date  . Anxiety    . Arthritis    . Cataract      left  . Depression    . Diabetes mellitus without complication ([place])    . Environmental and seasonal allergies    . GERD (gastroesophageal reflux disease)    . Hyperlipidemia    . Hypertension    . Joint pain      as reported by patient  . Urinary incontinence      as stated by patient         Past Surgical History:  Procedure Laterality Date  . BREAST EXCISIONAL BIOPSY Right 1999    neg  . BREAST LUMPECTOMY Right 2005    as reported by patient  . BUBBLE STUDY   04/24/2021    Procedure: BUBBLE STUDY;  Surgeon: Sueanne Margarita, MD;  Location: [place];  Service: Cardiovascular;;  . CATARACT EXTRACTION   January 2013  . CATARACT EXTRACTION W/PHACO Left 12/02/2016    Procedure: CATARACT EXTRACTION PHACO AND INTRAOCULAR LENS  PLACEMENT (IOC);  Surgeon: Estill Cotta, MD;  Location: [facility];  Service: Ophthalmology;  Laterality: Left;  [place] 2:14 AP% 28.4 CDE 59.73 Fluid pack lot # [id number]  . DIAGNOSTIC LAPAROSCOPY      . IR CT HEAD LTD   04/19/2021  . IR PERCUTANEOUS ART THROMBECTOMY/INFUSION INTRACRANIAL INC DIAG ANGIO   04/19/2021       . IR PERCUTANEOUS ART THROMBECTOMY/INFUSION INTRACRANIAL INC DIAG ANGIO   04/19/2021  . IR US GUIDE VASC ACCESS LEFT   04/19/2021  . RADIOLOGY WITH ANESTHESIA N/A 04/19/2021     Procedure: IR WITH ANESTHESIA;  Surgeon: Luanne Bras, MD;  Location: [place];  Service: Radiology;  Laterality: N/A;  . TEE WITHOUT CARDIOVERSION N/A 04/24/2021    Procedure: TRANSESOPHAGEAL ECHOCARDIOGRAM (TEE);  Surgeon: Sueanne Margarita, MD;  Location: [facility];  Service: Cardiovascular;  Laterality: N/A;  . TOTAL HIP ARTHROPLASTY Right 04/04/2018    Procedure: TOTAL HIP ARTHROPLASTY;  Surgeon: Dereck Leep, MD;  Location: [facility];  Service: Orthopedics;  Laterality: Right;         Family History  Problem Relation Age of Onset  . Bone cancer Brother    . Aneurysm Mother          brain  . Diabetes Father    . CAD Father    . Heart failure Father    . Colon cancer Father    . Cancer Father    . Alzheimer's disease Paternal Grandmother    . Dementia Paternal Grandmother    . Mental illness Paternal Grandfather    . Healthy Daughter    . Healthy Son    . Breast cancer Neg Hx      Social History:  reports that she quit smoking about 14 years ago. Her smoking use included cigarettes. She has a 37.50 pack-year smoking history. She has never used smokeless tobacco. She reports current alcohol use. She reports that she does not use drugs. Allergies:       Allergies  Allergen Reactions  . Jardiance [Empagliflozin] Itching      Recurrent mycotic infections  . Penicillins Rash and Other (See Comments)      Has patient had a PCN reaction causing immediate rash, facial/tongue/throat swelling, SOB or lightheadedness with hypotension: No Has patient had a PCN reaction causing severe rash involving mucus membranes or skin necrosis: No Has patient had a PCN reaction that required hospitalization No Has patient had a PCN reaction occurring within the last 10 years: No If all of the above answers are "NO", then may proceed with Cephalosporin use.            Medications Prior to Admission  Medication Sig Dispense Refill  . glipiZIDE (GLUCOTROL XL) 5 MG 24 hr tablet Take 5 mg by  mouth daily with breakfast.      . hydrochlorothiazide (HYDRODIURIL) 25 MG tablet Take 25 mg by mouth daily.      Marland Kitchen losartan (COZAAR) 25 MG tablet Take 25 mg by mouth 2 (two) times daily.      . metFORMIN (GLUCOPHAGE) 1000 MG tablet Take 1,000 mg by mouth 2 (two) times daily with a meal.      . Multiple Vitamin (MULTIVITAMIN) tablet Take 1 tablet by mouth daily.      . pioglitazone (ACTOS) 45 MG tablet Take 45 mg by mouth daily.      Marland Kitchen rOPINIRole (REQUIP) 1 MG tablet Take 1 mg by mouth at bedtime.      . sertraline (  ZOLOFT) 100 MG tablet Take 100 mg by mouth daily.      . simvastatin (ZOCOR) 20 MG tablet Take 20 mg by mouth at bedtime.      . sitaGLIPtin (JANUVIA) 100 MG tablet Take 100 mg by mouth daily.          Drug Regimen Review Drug regimen was reviewed and remains appropriate with no significant issues identified   Home: Home Living Family/patient expects to be discharged to:: Private residence Living Arrangements: Alone Available Help at Discharge: Family,Available 24 hours/day Type of Home: House Home Access: Stairs to enter [employer] of Steps: 2-3 Entrance Stairs-Rails: Right Home Layout: One level Bathroom Shower/Tub: Chiropodist: Standard Home Equipment: None  Lives With: Other (Comment)   Functional History: Prior Function Level of Independence: Independent Comments: still wokring, driving, wears readers   Functional Status:  Mobility: Bed Mobility Overal bed mobility: Needs Assistance Bed Mobility: Supine to Sit Supine to sit: Mod assist Sit to supine: Mod assist General bed mobility comments: modA for trunk elevation, LE advancement, and repositioning hips at EOB. Patient sitting in soiled bed linens and pads Transfers Overall transfer level: Needs assistance Equipment used: 1 person hand held assist Transfers: Squat Pivot Transfers,Sit to/from Stand Sit to Stand: Mod assist Stand pivot transfers: Max assist,+2 physical  assistance Squat pivot transfers: Max assist General transfer comment: modA for sit to stand x 2 for boost up and steady. Patient unable to advance R foot due to decreased weight shift to L. MaxA for squat pivot to recliner to the R. +2 would be beneficial for safety Ambulation/Gait Ambulation/Gait assistance: Max assist,+2 physical assistance Gait Distance (Feet): 3 Feet Assistive device: 2 person hand held assist Gait Pattern/deviations: Step-to pattern,Decreased stride length,Decreased weight shift to left,Ataxic General Gait Details: pt with difficulty coordination of placement of LLE, increased assist to steady due to strong L lean. increased verbal cues for directioning and movement of LLE Gait velocity: decreaseed Gait velocity interpretation: <1.31 ft/sec, indicative of household ambulator   ADL: ADL Overall ADL's : Needs assistance/impaired Eating/Feeding: Supervision/ safety,Sitting Grooming: Wash/dry hands,Moderate assistance,Sitting Grooming Details (indicate cue type and reason): with LUE Upper Body Bathing: Moderate assistance,Sitting Lower Body Bathing: Maximal assistance,Sitting/lateral leans,Sit to/from stand,Bed level Upper Body Dressing : Moderate assistance,Sitting Lower Body Dressing: Maximal assistance,Sitting/lateral leans,Sit to/from stand,Cueing for safety Lower Body Dressing Details (indicate cue type and reason): assist to keep BLEs with figure 4 technique Toilet Transfer: Maximal assistance,+2 for physical assistance,+2 for safety/equipment,Stand-pivot,Cueing for safety,Cueing for sequencing Toilet Transfer Details (indicate cue type and reason): simulated to recliner Toileting- Clothing Manipulation and Hygiene: Maximal assistance,Sitting/lateral lean,Sit to/from stand,+2 for physical assistance,+2 for safety/equipment Functional mobility during ADLs: Maximal assistance,+2 for physical assistance,+2 for safety/equipment,Cueing for sequencing,Cueing for  safety General ADL Comments: pt limited by decreased strength, decreased ability to care for self and decreased coordination on L side.   Cognition: Cognition Overall Cognitive Status: Impaired/Different from baseline Arousal/Alertness: Awake/alert Orientation Level: Oriented X4 Memory: Impaired (3/5 items recalled without cues, 2 with cues) Memory Impairment: Retrieval deficit Awareness: Appears intact Problem Solving: Appears intact Safety/Judgment: Appears intact Comments: Pt demonstrates good problem solving skills as she asked SLP for assist with meal orders and offered for SLP to write order to help SLP recall Cognition Arousal/Alertness: Awake/alert Behavior During Therapy: WFL for tasks assessed/performed,Impulsive Overall Cognitive Status: Impaired/Different from baseline Area of Impairment: Attention,Following commands,Safety/judgement,Awareness,Problem solving Current Attention Level: Sustained Memory: Decreased short-term memory Following Commands: Follows one step commands consistently Safety/Judgement: Decreased awareness of  safety,Decreased awareness of deficits Awareness: Emergent Problem Solving: Difficulty sequencing,Requires verbal cues,Requires tactile cues General Comments: patient impulsive this session and demos decreased awareness of deficits with transfers. Cues for attending to task as patient is easily distracted   Physical Exam: Blood pressure (!) 158/75, pulse 73, temperature 98.4 F (36.9 C), temperature source Oral, resp. rate 18, height 5\' 8"  (1.727 m), weight 99 kg, SpO2 91 %. Physical Exam Constitutional:      General: She is not in acute distress.    Appearance: She is obese.  HENT:     Head: Normocephalic and atraumatic.     Nose: Nose normal.     Mouth/Throat:     Mouth: Mucous membranes are moist.  Eyes:     Extraocular Movements: Extraocular movements intact.     Pupils: Pupils are equal, round, and reactive to light.  Cardiovascular:      Rate and Rhythm: Normal rate and regular rhythm.     Heart sounds: No murmur heard. No gallop.   Pulmonary:     Effort: Pulmonary effort is normal. No respiratory distress.     Breath sounds: No wheezing.     Comments: Intermittent cough Abdominal:     General: Bowel sounds are normal. There is no distension.     Palpations: Abdomen is soft.  Neurological:     Mental Status: She is alert.     Comments: Patient is alert.  She is a bit dysarthric.  Provides name age.  Follows commands.  Fair insight and awareness. Left central 7 without significant tongue deviation.  LUE 3-/5 prox to distal. LLE 3+/5 prox to 3+ to 4/5 distal. RUE and RLE 4/5 at least. No sensory changes. Toes down. No resting tone.   Psychiatric:        Mood and Affect: Mood normal.        Behavior: Behavior normal.        Lab Results Last 48 Hours        Results for orders placed or performed during the hospital encounter of 04/19/21 (from the past 48 hour(s))  Glucose, capillary     Status: Abnormal    Collection Time: 04/26/21 11:13 AM  Result Value Ref Range    Glucose-Capillary 204 (H) 70 - 99 mg/dL      Comment: Glucose reference range applies only to samples taken after fasting for at least 8 hours.  Glucose, capillary     Status: Abnormal    Collection Time: 04/26/21  4:05 PM  Result Value Ref Range    Glucose-Capillary 245 (H) 70 - 99 mg/dL      Comment: Glucose reference range applies only to samples taken after fasting for at least 8 hours.  Glucose, capillary     Status: Abnormal    Collection Time: 04/26/21  9:17 PM  Result Value Ref Range    Glucose-Capillary 160 (H) 70 - 99 mg/dL      Comment: Glucose reference range applies only to samples taken after fasting for at least 8 hours.  CBC     Status: None    Collection Time: 04/27/21  4:11 AM  Result Value Ref Range    WBC 9.7 4.0 - 10.5 K/uL    RBC 4.31 3.87 - 5.11 MIL/uL    Hemoglobin 12.4 12.0 - 15.0 g/dL    HCT 37.1 36.0 - 46.0 %    MCV  86.1 80.0 - 100.0 fL    MCH 28.8 26.0 - 34.0 pg    MCHC  33.4 30.0 - 36.0 g/dL    RDW 13.2 11.5 - 15.5 %    Platelets 263 150 - 400 K/uL    nRBC 0.0 0.0 - 0.2 %      Comment: Performed at [facility], [place] [address], [place], [place] [address]  Basic metabolic panel     Status: Abnormal    Collection Time: 04/27/21  4:11 AM  Result Value Ref Range    Sodium 135 135 - 145 mmol/L    Potassium 3.9 3.5 - 5.1 mmol/L    Chloride 99 98 - 111 mmol/L    CO2 26 22 - 32 mmol/L    Glucose, Bld 189 (H) 70 - 99 mg/dL      Comment: Glucose reference range applies only to samples taken after fasting for at least 8 hours.    BUN 26 (H) 8 - 23 mg/dL    Creatinine, Ser 1.02 (H) 0.44 - 1.00 mg/dL    Calcium 8.6 (L) 8.9 - 10.3 mg/dL    GFR, Estimated >60 >60 mL/min      Comment: (NOTE) Calculated using the CKD-EPI Creatinine Equation (2021)      Anion gap 10 5 - 15      Comment: Performed at [facility] [address], [place], [place] [address]  Glucose, capillary     Status: Abnormal    Collection Time: 04/27/21  6:19 AM  Result Value Ref Range    Glucose-Capillary 190 (H) 70 - 99 mg/dL      Comment: Glucose reference range applies only to samples taken after fasting for at least 8 hours.  Glucose, capillary     Status: Abnormal    Collection Time: 04/27/21 12:36 PM  Result Value Ref Range    Glucose-Capillary 170 (H) 70 - 99 mg/dL      Comment: Glucose reference range applies only to samples taken after fasting for at least 8 hours.  Glucose, capillary     Status: Abnormal    Collection Time: 04/27/21  4:59 PM  Result Value Ref Range    Glucose-Capillary 156 (H) 70 - 99 mg/dL      Comment: Glucose reference range applies only to samples taken after fasting for at least 8 hours.  Glucose, capillary     Status: None    Collection Time: 04/27/21  9:18 PM  Result Value Ref Range    Glucose-Capillary 88 70 - 99 mg/dL      Comment: Glucose reference range applies only to samples  taken after fasting for at least 8 hours.  CBC     Status: Abnormal    Collection Time: 04/28/21  4:17 AM  Result Value Ref Range    WBC 10.8 (H) 4.0 - 10.5 K/uL    RBC 4.50 3.87 - 5.11 MIL/uL    Hemoglobin 12.8 12.0 - 15.0 g/dL    HCT 39.3 36.0 - 46.0 %    MCV 87.3 80.0 - 100.0 fL    MCH 28.4 26.0 - 34.0 pg    MCHC 32.6 30.0 - 36.0 g/dL    RDW 13.3 11.5 - 15.5 %    Platelets 278 150 - 400 K/uL    nRBC 0.0 0.0 - 0.2 %      Comment: Performed at [facility], [place] [address] [place], [place] [address]  Basic metabolic panel     Status: Abnormal    Collection Time: 04/28/21  4:17 AM  Result Value Ref Range  Sodium 137 135 - 145 mmol/L    Potassium 4.1 3.5 - 5.1 mmol/L    Chloride 101 98 - 111 mmol/L    CO2 27 22 - 32 mmol/L    Glucose, Bld 124 (H) 70 - 99 mg/dL      Comment: Glucose reference range applies only to samples taken after fasting for at least 8 hours.    BUN 16 8 - 23 mg/dL    Creatinine, Ser 0.94 0.44 - 1.00 mg/dL    Calcium 9.0 8.9 - 10.3 mg/dL    GFR, Estimated >60 >60 mL/min      Comment: (NOTE) Calculated using the CKD-EPI Creatinine Equation (2021)      Anion gap 9 5 - 15      Comment: Performed at [facility] [address], [place], [place] [address]  Glucose, capillary     Status: Abnormal    Collection Time: 04/28/21  6:39 AM  Result Value Ref Range    Glucose-Capillary 123 (H) 70 - 99 mg/dL      Comment: Glucose reference range applies only to samples taken after fasting for at least 8 hours.      Imaging Results (Last 48 hours)  No results found.           Medical Problem List and Plan: 1.  Left side weakness secondary to right thalamic infarction.  Status post mechanical thrombectomy of right PCA P2 occlusion 04/19/2021.  Status post loop recorder             -patient may shower             -ELOS/Goals: 18-21 days, supervision with PT, OT, SLP 2.  Antithrombotics: -DVT/anticoagulation: SCDs             -antiplatelet therapy:  Aspirin 81 mg daily, Plavix 75 mg daily x3 weeks then aspirin alone 3. Pain Management: Tylenol as needed 4. Mood: Zoloft 100 mg daily, melatonin as needed.  Provide emotional support             -antipsychotic agents: N/A 5. Neuropsych: This patient is capable of making decisions on her own behalf. 6. Skin/Wound Care: Routine skin checks 7. Fluids/Electrolytes/Nutrition: Routine in and outs with follow-up chemistries 8.  Diabetes mellitus.  Hemoglobin A1c 9.3.  Currently on NovoLog 5 units 3 times daily with meals, Levemir 12 units twice daily.  Patient on Actos, Januvia 100 mg, Glucotrol XL 5 mg daily, metformin 1000 mg twice daily prior to admission.               -cbg's currently elevated.              -Check blood sugars before meals and at bedtime.               -resume glucophage at 500mg  bid             -Diabetic teaching 9.  Permissive hypertension.  Cozaar 25 mg twice daily 10.  Hyperlipidemia.  Lipitor 11.  Obesity.  BMI 33.19.  Dietary follow-up 12.  GERD.  Protonix 13. Constipation: scheduled senna-s, encourage liquids, fruits, vegetables             -dulcolax suppository/fleet enema prn 14. Cough: COVID negative             -appears to be mostly pharyngeal, upper airway             -will begin mucinex DM   Cathlyn Parsons, PA-C 04/28/2021  I  have personally performed a face to face diagnostic evaluation of this patient and formulated the key components of the plan.  Additionally, I have personally reviewed laboratory data, imaging studies, as well as relevant notes and concur with the physician assistant's documentation above.  The patient's status has not changed from the original H&P.  Any changes in documentation from the acute care chart have been noted above.  Meredith Staggers, MD, [facility]

## 2021-04-29 NOTE — Progress Notes (Signed)
Physical Therapy Treatment Patient Details Name: [name] MRN: [id number] DOB: 01/22/1956 Today's Date: 04/29/2021    History of Present Illness [name] is a 65 y.o. female presenting 5/6 due to L-sided weakness and numbness, who has a thalamic stroke on the right along with the MRA showing a possible right P1 occlusion. Pt now s/p thrombectomy on 5/7 with complete recanalization. PMHx; of poorly controlled diabetes and hypertension, hyperlipidemia, obesity, anxiety, depression.    PT Comments    Pt napping upon PT/OT entry but easily arousable. Pt very agreeable to therapy and states she wants to get better. vc's needed for safely sequencing bed mobility and pt with limited carryover session to session with this. Once sequencing properly pt able to come to EOB with min A. Pt performed multiple sit<>stand with bed at different heights and min A +2 with cues for fwd wt shifting and maintaining midline, as pt tends to lean L. Pregait activities performed in standing before stepping to chair with RW and mod A +2. Continue to rec CIR at d/c. PT will continue to follow.    Follow Up Recommendations  CIR     Equipment Recommendations  None recommended by PT    Recommendations for Other Services Rehab consult     Precautions / Restrictions Precautions Precautions: Fall Restrictions Weight Bearing Restrictions: No    Mobility  Bed Mobility Overal bed mobility: Needs Assistance Bed Mobility: Rolling;Sidelying to Sit Rolling: Min assist Sidelying to sit: Min assist       General bed mobility comments: little carryover from previous session for coming to EOB, pt continues to prefer to slide LE's to EOB but is unable to sit up from that position. vc's to reposition and then pt able to roll R with min A. Min A for LE's off EOB and facilitation of wt shifting from R to L with min A    Transfers Overall transfer level: Needs assistance Equipment used: Rolling walker (2  wheeled) Transfers: Sit to/from Stand Sit to Stand: Min assist;+2 safety/equipment         General transfer comment: pt using momentum to stand and her count of 3. This seemed to really help her sequence. Performed multiple times from elevated bed and std height with cues for foot placement and fwd wt shift. Assist needed to keep L hand on RW  Ambulation/Gait Ambulation/Gait assistance: +2 physical assistance;Mod assist Gait Distance (Feet): 3 Feet Assistive device: Rolling walker (2 wheeled) Gait Pattern/deviations: Step-to pattern;Decreased stride length;Decreased weight shift to left Gait velocity: decreaseed Gait velocity interpretation: <1.31 ft/sec, indicative of household ambulator General Gait Details: pt with L lean and instability of L knee and therefore, resistance to wt shift L to be able to pick up R foot. Worked on bkwd walking and pt able to slide BLE's bkwd but unable to pick up.   Stairs             Wheelchair Mobility    Modified Rankin (Stroke Patients Only) Modified Rankin (Stroke Patients Only) Pre-Morbid Rankin Score: No symptoms Modified Rankin: Severe disability     Balance Overall balance assessment: Needs assistance Sitting-balance support: Feet supported;Single extremity supported Sitting balance-Leahy Scale: Fair   Postural control: Left lateral lean Standing balance support: Bilateral upper extremity supported;During functional activity Standing balance-Leahy Scale: Poor Standing balance comment: reliant on external assist, strong L lean with trunk  but does not shift wt to L at hips  High Level Balance Comments: worked on pregait activities in standing: fwd and bkwd tapping with RLE then LLE            Cognition Arousal/Alertness: Awake/alert Behavior During Therapy: WFL for tasks assessed/performed;Impulsive Overall Cognitive Status: Impaired/Different from baseline Area of Impairment: Attention;Following  commands;Safety/judgement;Awareness;Problem solving;Memory                   Current Attention Level: Sustained Memory: Decreased short-term memory Following Commands: Follows one step commands consistently Safety/Judgement: Decreased awareness of safety;Decreased awareness of deficits Awareness: Emergent Problem Solving: Difficulty sequencing;Requires verbal cues;Requires tactile cues General Comments: pt needs encouragement to continue on through mobility tasks when they are hard, she tends to want to take a break as soon as she feels fatigued.      Exercises General Exercises - Lower Extremity Ankle Circles/Pumps: AROM;Both;10 reps;Supine Heel Slides: AROM;Left;5 reps;Supine Straight Leg Raises: Left;Supine;AROM;5 reps    General Comments General comments (skin integrity, edema, etc.): VSS      Pertinent Vitals/Pain Pain Assessment: Faces Faces Pain Scale: Hurts a little bit Pain Location: abdominal discomfort Pain Descriptors / Indicators: Discomfort Pain Intervention(s): Other (comment) (relayed to Rn pt's request for enema)    Home Living                      Prior Function            PT Goals (current goals can now be found in the care plan section) Acute Rehab PT Goals Patient Stated Goal: return to independence PT Goal Formulation: With patient Time For Goal Achievement: 05/03/21 Potential to Achieve Goals: Good Progress towards PT goals: Progressing toward goals    Frequency    Min 4X/week      PT Plan Current plan remains appropriate    Co-evaluation PT/OT/SLP Co-Evaluation/Treatment: Yes Reason for Co-Treatment: Complexity of the patient's impairments (multi-system involvement);Necessary to address cognition/behavior during functional activity;For patient/therapist safety PT goals addressed during session: Mobility/safety with mobility;Balance;Proper use of DME;Strengthening/ROM        AM-PAC PT "6 Clicks" Mobility   Outcome  Measure  Help needed turning from your back to your side while in a flat bed without using bedrails?: A Little Help needed moving from lying on your back to sitting on the side of a flat bed without using bedrails?: A Lot Help needed moving to and from a bed to a chair (including a wheelchair)?: A Lot Help needed standing up from a chair using your arms (e.g., wheelchair or bedside chair)?: A Lot Help needed to walk in hospital room?: A Lot Help needed climbing 3-5 steps with a railing? : Total 6 Click Score: 12    End of Session Equipment Utilized During Treatment: Gait belt Activity Tolerance: Patient tolerated treatment well Patient left: in chair;with call bell/phone within reach;with chair alarm set Nurse Communication: Mobility status PT Visit Diagnosis: Other abnormalities of gait and mobility (R26.89);Ataxic gait (R26.0)     Time: 6734-1937 PT Time Calculation (min) (ACUTE ONLY): 30 min  Charges:  $Gait Training: 8-22 mins                     Leighton Roach, [place]  Pager [phone] Office [place] 04/29/2021, 10:52 AM

## 2021-04-29 NOTE — Progress Notes (Signed)
Physical Medicine and Rehabilitation Consult Reason for Consult: Left side weakness Referring Physician: Dr.[place]     HPI: [name] is a 65 y.o. right-handed female with history of uncontrolled diabetes mellitus, hypertension, hyperlipidemia, remote tobacco abuse, obesity with BMI 33.45, anxiety/depression.  Per chart review patient lives alone.  Independent prior to admission and still working.  1 level home 3 steps to entry.  Presented 04/19/2021 with acute onset of left-sided weakness and poor balance as well as dizziness.  CT/MRI as well as MRA showed small acute infarct of the anterior lateral right thalamus adjacent to the posterior limb of the right internal capsule.  Right PCA mid P1 segment occlusion.  Patient did receive tPA.  Admission chemistries unremarkable except sodium 133 chloride 96 glucose 272 BUN 25 creatinine 1.09, hemoglobin A1c 9.3.  Patient underwent thrombectomy 04/19/2021 with complete recanalization per interventional radiology.  Echocardiogram with ejection fraction of 70 to 75% no wall motion abnormalities grade 1 diastolic dysfunction.  Currently maintained on aspirin 81 mg daily and Plavix 75 mg daily for CVA prophylaxis.  Tolerating a regular consistency diet.  Due to patient's left-sided weakness and decreased functional mobility recommendations of physical medicine rehab consult.     Review of Systems  Constitutional: Negative for chills and fever.  HENT: Negative for hearing loss.   Eyes: Negative for blurred vision and double vision.  Respiratory: Negative for cough and shortness of breath.   Cardiovascular: Negative for chest pain, palpitations and leg swelling.  Gastrointestinal: Positive for constipation. Negative for heartburn, nausea and vomiting.       GERD  Genitourinary: Negative for dysuria and hematuria.       Stress incontinence  Musculoskeletal: Positive for myalgias.  Skin: Negative for rash.  Neurological: Positive for sensory  change, speech change and weakness.  Psychiatric/Behavioral: Positive for depression.       Anxiety  All other systems reviewed and are negative.       Past Medical History:  Diagnosis Date  . Anxiety    . Arthritis    . Cataract      left  . Depression    . Diabetes mellitus without complication ([place])    . Environmental and seasonal allergies    . GERD (gastroesophageal reflux disease)    . Hyperlipidemia    . Hypertension    . Joint pain      as reported by patient  . Urinary incontinence      as stated by patient         Past Surgical History:  Procedure Laterality Date  . BREAST EXCISIONAL BIOPSY Right 1999    neg  . BREAST LUMPECTOMY Right 2005    as reported by patient  . CATARACT EXTRACTION   January 2013  . CATARACT EXTRACTION W/PHACO Left 12/02/2016    Procedure: CATARACT EXTRACTION PHACO AND INTRAOCULAR LENS PLACEMENT (IOC);  Surgeon: Estill Cotta, MD;  Location: [facility] ORS;  Service: Ophthalmology;  Laterality: Left;  [place] 2:14 AP% 28.4 CDE 59.73 Fluid pack lot # [id number]  . DIAGNOSTIC LAPAROSCOPY      . IR PERCUTANEOUS ART THROMBECTOMY/INFUSION INTRACRANIAL INC DIAG ANGIO   04/19/2021       . TOTAL HIP ARTHROPLASTY Right 04/04/2018    Procedure: TOTAL HIP ARTHROPLASTY;  Surgeon: Dereck Leep, MD;  Location: [facility] ORS;  Service: Orthopedics;  Laterality: Right;         Family History  Problem Relation Age of Onset  .  Bone cancer Brother    . Aneurysm Mother          brain  . Diabetes Father    . CAD Father    . Heart failure Father    . Colon cancer Father    . Cancer Father    . Alzheimer's disease Paternal Grandmother    . Dementia Paternal Grandmother    . Mental illness Paternal Grandfather    . Healthy Daughter    . Healthy Son    . Breast cancer Neg Hx      Social History:  reports that she quit smoking about 14 years ago. Her smoking use included cigarettes. She has a 37.50 pack-year smoking history. She has never used smokeless tobacco.  She reports current alcohol use. She reports that she does not use drugs. Allergies:       Allergies  Allergen Reactions  . Jardiance [Empagliflozin] Itching      Recurrent mycotic infections  . Penicillins Rash and Other (See Comments)      Has patient had a PCN reaction causing immediate rash, facial/tongue/throat swelling, SOB or lightheadedness with hypotension: No Has patient had a PCN reaction causing severe rash involving mucus membranes or skin necrosis: No Has patient had a PCN reaction that required hospitalization No Has patient had a PCN reaction occurring within the last 10 years: No If all of the above answers are "NO", then may proceed with Cephalosporin use.            Medications Prior to Admission  Medication Sig Dispense Refill  . glipiZIDE (GLUCOTROL XL) 5 MG 24 hr tablet Take 5 mg by mouth daily with breakfast.      . hydrochlorothiazide (HYDRODIURIL) 25 MG tablet Take 25 mg by mouth daily.      Marland Kitchen losartan (COZAAR) 25 MG tablet Take 25 mg by mouth 2 (two) times daily.      . metFORMIN (GLUCOPHAGE) 1000 MG tablet Take 1,000 mg by mouth 2 (two) times daily with a meal.      . Multiple Vitamin (MULTIVITAMIN) tablet Take 1 tablet by mouth daily.      . pioglitazone (ACTOS) 45 MG tablet Take 45 mg by mouth daily.      Marland Kitchen rOPINIRole (REQUIP) 1 MG tablet Take 1 mg by mouth at bedtime.      . sertraline (ZOLOFT) 100 MG tablet Take 100 mg by mouth daily.      . simvastatin (ZOCOR) 20 MG tablet Take 20 mg by mouth at bedtime.      . sitaGLIPtin (JANUVIA) 100 MG tablet Take 100 mg by mouth daily.          Home: Home Living Family/patient expects to be discharged to:: Private residence Living Arrangements: Alone Available Help at Discharge: Family,Available 24 hours/day Type of Home: House Home Access: Stairs to enter [employer] of Steps: 2-3 Entrance Stairs-Rails: Right Home Layout: One level Bathroom Shower/Tub: Chiropodist:  Standard Home Equipment: None  Functional History: Prior Function Level of Independence: Independent Comments: still wokring, driving, wears readers Functional Status:  Mobility: Bed Mobility Overal bed mobility: Needs Assistance Bed Mobility: Supine to Sit Supine to sit: Min assist,HOB elevated General bed mobility comments: requires cues to come up on L side and use of RUE for pulling self to EOB Transfers Overall transfer level: Needs assistance Equipment used: 2 person hand held assist Transfers: Sit to/from [employer] Sit to Stand: Mod assist,+2 physical assistance Stand pivot transfers: Max assist,+2  physical assistance General transfer comment: modA to power up, pt with good power through RLE, but needing significant assist on L due to leaning. pt incontinent of urine upon standing, significant assist to steady with stepping, decreased coordination of placement and positioning of LLE Ambulation/Gait Ambulation/Gait assistance: Max assist,+2 physical assistance Gait Distance (Feet): 3 Feet Assistive device: 2 person hand held assist Gait Pattern/deviations: Step-to pattern,Decreased stride length,Decreased weight shift to left,Ataxic General Gait Details: pt with difficulty coordination of placement of LLE, increased assist to steady due to strong L lean. increased verbal cues for directioning and movement of LLE Gait velocity: decreaseed Gait velocity interpretation: <1.31 ft/sec, indicative of household ambulator   ADL: ADL Overall ADL's : Needs assistance/impaired Eating/Feeding: Moderate assistance Grooming: Maximal assistance,Sitting Grooming Details (indicate cue type and reason): with LUE Upper Body Bathing: Moderate assistance,Sitting Lower Body Bathing: Maximal assistance,Sitting/lateral leans,Sit to/from stand,Bed level Upper Body Dressing : Moderate assistance,Sitting Lower Body Dressing: Maximal assistance,Sitting/lateral leans,Sit to/from  stand,Cueing for safety Lower Body Dressing Details (indicate cue type and reason): assist to keep BLEs with figure 4 technique Toilet Transfer: Maximal assistance,+2 for physical assistance,+2 for safety/equipment,Stand-pivot,Cueing for safety,Cueing for sequencing Toilet Transfer Details (indicate cue type and reason): simulated to recliner Toileting- Clothing Manipulation and Hygiene: Maximal assistance,Sitting/lateral lean,Sit to/from stand,+2 for physical assistance,+2 for safety/equipment Functional mobility during ADLs: Maximal assistance,+2 for physical assistance,+2 for safety/equipment,Cueing for sequencing,Cueing for safety General ADL Comments: pt limited by decreased strength, decreased ability to care for self and decreased coordination on L side.   Cognition: Cognition Overall Cognitive Status: Impaired/Different from baseline Orientation Level: Oriented X4 Cognition Arousal/Alertness: Awake/alert Behavior During Therapy: WFL for tasks assessed/performed,Impulsive Overall Cognitive Status: Impaired/Different from baseline Area of Impairment: Attention,Memory,Following commands,Awareness,Safety/judgement,Problem solving Current Attention Level: Focused Memory: Decreased recall of precautions,Decreased short-term memory Following Commands: Follows one step commands consistently Safety/Judgement: Decreased awareness of safety,Decreased awareness of deficits Awareness: Intellectual Problem Solving: Decreased initiation,Slow processing,Difficulty sequencing,Requires verbal cues General Comments: Pt with decreased awareness of deficits on L side and near inattention to LUE. Pt required cues for sequencing and for safety. Pt unaware of bladder incontinence x2 times when in standing.   Blood pressure (!) 161/71, pulse 86, temperature 98.7 F (37.1 C), temperature source Oral, resp. rate 18, height 5\' 8"  (1.727 m), weight 99 kg, SpO2 93 %. Physical Exam Vitals and nursing note  reviewed.  Constitutional:      Appearance: She is obese.  HENT:     Head: Normocephalic and atraumatic.  Eyes:     Extraocular Movements: Extraocular movements intact.     Conjunctiva/sclera: Conjunctivae normal.     Pupils: Pupils are equal, round, and reactive to light.  Cardiovascular:     Rate and Rhythm: Normal rate and regular rhythm.     Heart sounds: Normal heart sounds.  Pulmonary:     Effort: Pulmonary effort is normal. No respiratory distress.     Breath sounds: Normal breath sounds.  Abdominal:     General: Abdomen is flat. Bowel sounds are normal. There is no distension.     Palpations: Abdomen is soft.     Tenderness: There is no abdominal tenderness.  Musculoskeletal:        General: No tenderness.     Comments: No pain with upper limb or lower limb range of motion No evidence of knee effusion bilaterally  Skin:    General: Skin is warm and dry.  Neurological:     Mental Status: She is alert and oriented to person, place, and  time.     Comments: Patient is alert no acute distress.  Makes eye contact with examiner.  Speech is a bit dysarthric but intelligible.  Provides name and age.  Follows simple commands.  Fair insight and awareness. Visual fields are intact confrontation testing Motor strength is 5/5 in the right deltoid, bicep, tricep, grip, hip flexor, knee extensor, ankle dorsiflexor and plantar flexor 3 - left deltoid bicep tricep 2 - finger flexors 0 finger extensors 0 wrist extensors 3 - left hip flexor 4 knee extensor and 4 - ankle dorsiflexor. Sensation is reduced to light touch as well as deep pressure in the left upper and left lower extremity. Sensation is intact on the right side  Psychiatric:        Mood and Affect: Mood normal.        Behavior: Behavior normal.        Lab Results Last 24 Hours       Results for orders placed or performed during the hospital encounter of 04/19/21 (from the past 24 hour(s))  Basic metabolic panel     Status:  Abnormal    Collection Time: 04/20/21  9:43 AM  Result Value Ref Range    Sodium 132 (L) 135 - 145 mmol/L    Potassium 3.9 3.5 - 5.1 mmol/L    Chloride 99 98 - 111 mmol/L    CO2 24 22 - 32 mmol/L    Glucose, Bld 256 (H) 70 - 99 mg/dL    BUN 10 8 - 23 mg/dL    Creatinine, Ser 0.89 0.44 - 1.00 mg/dL    Calcium 8.8 (L) 8.9 - 10.3 mg/dL    GFR, Estimated >60 >60 mL/min    Anion gap 9 5 - 15       Imaging Results (Last 48 hours)  MR BRAIN WO CONTRAST   Result Date: 04/19/2021 CLINICAL DATA:  Stroke, follow-up. Status post recanalization of right P2 occlusion. EXAM: MRI HEAD WITHOUT CONTRAST TECHNIQUE: Multiplanar, multiecho pulse sequences of the brain and surrounding structures were obtained without intravenous contrast. COMPARISON:  MR head 04/18/2021 FINDINGS: Brain: The diffusion-weighted images again demonstrate an acute nonhemorrhagic infarct within the lateral aspect of the right thalamus. Additional acute nonhemorrhagic infarct is now present within the more superomedial right thalamus. A punctate nonhemorrhagic infarct is present in the left thalamus. Punctate infarct noted in the right cerebral peduncle. Scattered areas of restricted diffusion are present in the medial right occipital lobe. Acute punctate nonhemorrhagic infarct is present in the left side of the splenium of the corpus callosum. A punctate acute infarct is suspected in the right cerebellum. T2 signal changes are associated with these areas. No acute hemorrhage is present. No significant mass effect is present. Periventricular white matter changes are otherwise stable. No significant extraaxial fluid collection is present. The brainstem and cerebellum are otherwise normal. Vascular: Flow is present in the major intracranial arteries. Flow void better demonstrated in the proximal right PCA. Skull and upper cervical spine: The craniocervical junction is normal. Upper cervical spine is within normal limits. Marrow signal is  unremarkable. Sinuses/Orbits: The paranasal sinuses and mastoid air cells are clear. Bilateral lens replacements are noted. Globes and orbits are otherwise unremarkable. IMPRESSION: 1. Stable acute nonhemorrhagic infarct involving the lateral aspect of the right thalamus. 2. Additional acute nonhemorrhagic infarcts involving the more superior and medial right thalamus, the right occipital lobe, the left side of the splenium of the corpus callosum, and the right cerebellum. 3. Stable white matter disease. This  likely reflects the sequela of chronic microvascular ischemia. Electronically Signed   By: San Morelle M.D.   On: 04/19/2021 17:32    ECHOCARDIOGRAM COMPLETE   Result Date: 04/19/2021    ECHOCARDIOGRAM REPORT   Patient Name:   [name] Date of Exam: 04/19/2021 Medical Rec #:  [id number]       Height:       68.0 in Accession #:    [id number]      Weight:       220.0 lb Date of Birth:  [birth year]      BSA:          2.128 m Patient Age:    89 years        BP:           132/69 mmHg Patient Gender: F               HR:           87 bpm. Exam Location:  Inpatient Procedure: 2D Echo, Cardiac Doppler and Color Doppler Indications:    CVA  History:        Patient has no prior history of Echocardiogram examinations.                 Risk Factors:Dyslipidemia, Diabetes and Hypertension.  Sonographer:    Luisa Hart RDCS Referring Phys: [id number] ASHISH ARORA IMPRESSIONS  1. Left ventricular ejection fraction, by estimation, is 70 to 75%. The left ventricle has hyperdynamic function. The left ventricle has no regional wall motion abnormalities. Left ventricular diastolic parameters are consistent with Grade I diastolic dysfunction (impaired relaxation). Elevated left atrial pressure.  2. Right ventricular systolic function is normal. The right ventricular size is normal. Tricuspid regurgitation signal is inadequate for assessing PA pressure.  3. Left atrial size was mildly dilated.  4. The mitral valve is  normal in structure. Mild mitral valve regurgitation. No evidence of mitral stenosis.  5. The aortic valve is normal in structure. Aortic valve regurgitation is not visualized. No aortic stenosis is present.  6. The inferior vena cava is normal in size with greater than 50% respiratory variability, suggesting right atrial pressure of 3 mmHg. FINDINGS  Left Ventricle: Left ventricular ejection fraction, by estimation, is 70 to 75%. The left ventricle has hyperdynamic function. The left ventricle has no regional wall motion abnormalities. The left ventricular internal cavity size was normal in size. There is no left ventricular hypertrophy. Left ventricular diastolic parameters are consistent with Grade I diastolic dysfunction (impaired relaxation). Elevated left atrial pressure. Right Ventricle: The right ventricular size is normal. No increase in right ventricular wall thickness. Right ventricular systolic function is normal. Tricuspid regurgitation signal is inadequate for assessing PA pressure. Left Atrium: Left atrial size was mildly dilated. Right Atrium: Right atrial size was normal in size. Pericardium: There is no evidence of pericardial effusion. Mitral Valve: The mitral valve is normal in structure. Mild mitral annular calcification. Mild mitral valve regurgitation. No evidence of mitral valve stenosis. Tricuspid Valve: The tricuspid valve is normal in structure. Tricuspid valve regurgitation is not demonstrated. No evidence of tricuspid stenosis. Aortic Valve: The aortic valve is normal in structure. Aortic valve regurgitation is not visualized. No aortic stenosis is present. Aortic valve mean gradient measures 7.0 mmHg. Aortic valve peak gradient measures 12.2 mmHg. Pulmonic Valve: The pulmonic valve was normal in structure. Pulmonic valve regurgitation is not visualized. No evidence of pulmonic stenosis. Aorta: The aortic root is normal in size and structure. Venous:  The inferior vena cava is normal in  size with greater than 50% respiratory variability, suggesting right atrial pressure of 3 mmHg. IAS/Shunts: No atrial level shunt detected by color flow Doppler.  LEFT VENTRICLE PLAX 2D LVIDd:         5.50 cm     Diastology LVIDs:         3.20 cm     LV e' medial:    5.77 cm/s LV PW:         1.00 cm     LV E/e' medial:  16.9 LV IVS:        1.00 cm     LV e' lateral:   5.33 cm/s                            LV E/e' lateral: 18.3  LV Volumes (MOD) LV vol d, MOD A2C: 57.3 ml LV vol d, MOD A4C: 83.9 ml LV vol s, MOD A2C: 7.2 ml LV vol s, MOD A4C: 18.5 ml LV SV MOD A2C:     50.1 ml LV SV MOD A4C:     83.9 ml LV SV MOD BP:      60.2 ml RIGHT VENTRICLE RV S prime:     17.10 cm/s  PULMONARY VEINS TAPSE (M-mode): 2.7 cm      A Reversal Duration: 85.00 msec                             A Reversal Velocity: 17.80 cm/s                             Diastolic Velocity:  02.58 cm/s                             S/D Velocity:        1.50                             Systolic Velocity:   52.77 cm/s LEFT ATRIUM           Index       RIGHT ATRIUM           Index LA diam:      4.10 cm 1.93 cm/m  RA Area:     11.90 cm LA Vol (A2C): 59.4 ml 27.91 ml/m RA Volume:   25.00 ml  11.75 ml/m  AORTIC VALVE                    PULMONIC VALVE AV Vmax:           175.00 cm/s  PV Vmax:       1.27 m/s AV Vmean:          124.000 cm/s PV Vmean:      98.400 cm/s AV VTI:            0.360 m      PV VTI:        0.249 m AV Peak Grad:      12.2 mmHg    PV Peak grad:  6.5 mmHg AV Mean Grad:      7.0 mmHg     PV Mean grad:  4.0 mmHg LVOT Vmax:         98.40 cm/s  LVOT Vmean:        73.100 cm/s LVOT VTI:          0.225 m LVOT/AV VTI ratio: 0.62  AORTA Ao Root diam: 2.80 cm Ao Asc diam:  3.00 cm MITRAL VALVE MV Area (PHT): 3.72 cm     SHUNTS MV Decel Time: 204 msec     Systemic VTI: 0.22 m MV E velocity: 97.30 cm/s MV A velocity: 150.00 cm/s MV E/A ratio:  0.65 Mihai Croitoru MD Electronically signed by Sanda Klein MD Signature Date/Time: 04/19/2021/2:58:20 PM     Final          Assessment/Plan: Diagnosis: Right thalamic stroke 1. Does the need for close, 24 hr/day medical supervision in concert with the patient's rehab needs make it unreasonable for this patient to be served in a less intensive setting? Yes 2. Co-Morbidities requiring supervision/potential complications: Diabetes mellitus, hypertension, hyperlipidemia, left hemiparesis, left hemisensory deficits 3. Due to bladder management, bowel management, safety, skin/wound care, disease management, medication administration, pain management and patient education, does the patient require 24 hr/day rehab nursing? Yes 4. Does the patient require coordinated care of a physician, rehab nurse, therapy disciplines of PT, OT, speech to address physical and functional deficits in the context of the above medical diagnosis(es)? Yes Addressing deficits in the following areas: balance, endurance, locomotion, strength, transferring, bowel/bladder control, bathing, dressing, feeding, grooming, toileting, cognition and psychosocial support 5. Can the patient actively participate in an intensive therapy program of at least 3 hrs of therapy per day at least 5 days per week? Yes 6. The potential for patient to make measurable gains while on inpatient rehab is excellent 7. Anticipated functional outcomes upon discharge from inpatient rehab are supervision  with PT, supervision with OT, modified independent and supervision with SLP. 8. Estimated rehab length of stay to reach the above functional goals is: 18 to 21 days 9. Anticipated discharge destination: Home 10. Overall Rehab/Functional Prognosis: good   RECOMMENDATIONS: This patient's condition is appropriate for continued rehabilitative care in the following setting: CIR Patient has agreed to participate in recommended program. Yes Note that insurance prior authorization may be required for reimbursement for recommended care.   Comment:  We will need to see  which family members would be able to assist the patient postdischarge   Cathlyn Parsons, PA-C 04/21/2021

## 2021-04-29 NOTE — Progress Notes (Signed)
Inpatient Rehab Admissions Coordinator:   I have a bed available for this patient to admit to CIR today.  Covid test yesterday negative.  Stroke team in agreement.  Will let pt/family and TOC team know.   Shann Medal, PT, DPT Admissions Coordinator [phone] 04/29/21  10:06 AM

## 2021-04-29 NOTE — Discharge Summary (Addendum)
Stroke Discharge Summary  Patient ID: [name]   MRN: [id number]      DOB: February 04, 1956  Date of Admission: 04/19/2021 Date of Discharge: 04/29/2021  Attending Physician:  Stroke, Md, MD, Stroke MD Consultant(s):  Treatment Team:  Lajuana Matte, MD, Cardiothoracic surgery, for management of aortic valve mass, Electrophysiology Dr. Rayann Heman for TEE;  Dr. Gasper Sells, Cardiology, for pre-surgical risk assessment  Patient's PCP:  Mar Daring, PA-C  Discharge Diagnoses: Active Problems:   Ischemic stroke [facility])   Cerebrovascular accident (CVA) ([place])   Aortic valve mass   Diabetes (HCC)   BP (high blood pressure)   Dyslipidemia, goal LDL below 70   Medications to be continued on Rehab Allergies as of 04/29/2021      Reactions   Jardiance [empagliflozin] Itching   Recurrent mycotic infections   Penicillins Rash, Other (See Comments)   Has patient had a PCN reaction causing immediate rash, facial/tongue/throat swelling, SOB or lightheadedness with hypotension: No Has patient had a PCN reaction causing severe rash involving mucus membranes or skin necrosis: No Has patient had a PCN reaction that required hospitalization No Has patient had a PCN reaction occurring within the last 10 years: No If all of the above answers are "NO", then may proceed with Cephalosporin use.      Medication List    STOP taking these medications   glipiZIDE 5 MG 24 hr tablet Commonly known as: GLUCOTROL XL   hydrochlorothiazide 25 MG tablet Commonly known as: HYDRODIURIL   metFORMIN 1000 MG tablet Commonly known as: GLUCOPHAGE   pioglitazone 45 MG tablet Commonly known as: ACTOS   simvastatin 20 MG tablet Commonly known as: ZOCOR   sitaGLIPtin 100 MG tablet Commonly known as: JANUVIA     TAKE these medications   acetaminophen 325 MG tablet Commonly known as: TYLENOL Take 2 tablets (650 mg total) by mouth every 4 (four) hours as needed for mild pain (or temp > 37.5  C (99.5 F)).   aspirin 81 MG chewable tablet Chew 1 tablet (81 mg total) by mouth daily.   atorvastatin 40 MG tablet Commonly known as: LIPITOR Take 1 tablet (40 mg total) by mouth daily.   clopidogrel 75 MG tablet Commonly known as: PLAVIX Take 1 tablet (75 mg total) by mouth daily with breakfast.   guaiFENesin 100 MG/5ML Soln Commonly known as: ROBITUSSIN Take 5 mLs (100 mg total) by mouth every 4 (four) hours as needed for cough or to loosen phlegm.   insulin aspart 100 UNIT/ML injection Commonly known as: novoLOG Inject 5 Units into the skin 3 (three) times daily with meals.   insulin aspart 100 UNIT/ML injection Commonly known as: novoLOG Inject 0-5 Units into the skin at bedtime.   insulin aspart 100 UNIT/ML injection Commonly known as: novoLOG Inject 0-15 Units into the skin 3 (three) times daily with meals.   insulin detemir 100 UNIT/ML injection Commonly known as: LEVEMIR Inject 0.12 mLs (12 Units total) into the skin 2 (two) times daily.   losartan 25 MG tablet Commonly known as: COZAAR Take 25 mg by mouth 2 (two) times daily.   melatonin 3 MG Tabs tablet Take 1 tablet (3 mg total) by mouth at bedtime as needed.   multivitamin tablet Take 1 tablet by mouth daily.   pantoprazole 40 MG tablet Commonly known as: PROTONIX Take 1 tablet (40 mg total) by mouth at bedtime.   rOPINIRole 1 MG tablet Commonly known as: REQUIP Take 1 mg  by mouth at bedtime.   senna-docusate 8.6-50 MG tablet Commonly known as: Senokot-S Take 1 tablet by mouth at bedtime as needed for mild constipation.   sertraline 100 MG tablet Commonly known as: ZOLOFT Take 100 mg by mouth daily.   sodium phosphate 7-19 GM/118ML Enem Place 133 mLs (1 enema total) rectally daily as needed for severe constipation.       LABORATORY STUDIES CBC    Component Value Date/Time   WBC 11.9 (H) 04/29/2021 0415   RBC 4.15 04/29/2021 0415   HGB 12.1 04/29/2021 0415   HGB 13.4 09/20/2020 1132    HCT 36.1 04/29/2021 0415   HCT 39.4 09/20/2020 1132   PLT 308 04/29/2021 0415   PLT 266 09/20/2020 1132   MCV 87.0 04/29/2021 0415   MCV 86 09/20/2020 1132   MCH 29.2 04/29/2021 0415   MCHC 33.5 04/29/2021 0415   RDW 13.2 04/29/2021 0415   RDW 12.8 09/20/2020 1132   LYMPHSABS 0.9 04/24/2021 1059   LYMPHSABS 1.5 09/20/2020 1132   MONOABS 0.6 04/24/2021 1059   EOSABS 0.3 04/24/2021 1059   EOSABS 0.2 09/20/2020 1132   BASOSABS 0.0 04/24/2021 1059   BASOSABS 0.0 09/20/2020 1132   CMP    Component Value Date/Time   NA 134 (L) 04/29/2021 0415   NA 135 09/20/2020 1132   NA 135 (L) 09/06/2013 1518   K 4.0 04/29/2021 0415   K 4.4 09/06/2013 1518   CL 97 (L) 04/29/2021 0415   CL 102 09/06/2013 1518   CO2 27 04/29/2021 0415   CO2 27 09/06/2013 1518   GLUCOSE 174 (H) 04/29/2021 0415   GLUCOSE 123 (H) 09/06/2013 1518   BUN 13 04/29/2021 0415   BUN 14 09/20/2020 1132   BUN 20 (H) 09/06/2013 1518   CREATININE 0.90 04/29/2021 0415   CREATININE 0.80 09/06/2013 1518   CALCIUM 8.8 (L) 04/29/2021 0415   CALCIUM 9.2 09/06/2013 1518   PROT 8.0 04/18/2021 1821   PROT 7.7 09/20/2020 1132   PROT 8.1 09/06/2013 1518   ALBUMIN 4.0 04/18/2021 1821   ALBUMIN 4.4 09/20/2020 1132   ALBUMIN 4.1 09/06/2013 1518   AST 23 04/18/2021 1821   AST 25 09/06/2013 1518   ALT 16 04/18/2021 1821   ALT 22 09/06/2013 1518   ALKPHOS 45 04/18/2021 1821   ALKPHOS 67 09/06/2013 1518   BILITOT 0.6 04/18/2021 1821   BILITOT 0.4 09/20/2020 1132   BILITOT 0.5 09/06/2013 1518   GFRNONAA >60 04/29/2021 0415   GFRNONAA >60 09/06/2013 1518   GFRAA 81 09/20/2020 1132   GFRAA >60 09/06/2013 1518   COAGS Lab Results  Component Value Date   INR 1.0 04/18/2021   INR 0.94 03/23/2018   Lipid Panel    Component Value Date/Time   CHOL 195 04/19/2021 0427   CHOL 233 (H) 09/20/2020 1132   CHOL 205 (H) 09/06/2013 1518   TRIG 221 (H) 04/19/2021 0427   TRIG 174 09/06/2013 1518   HDL 46 04/19/2021 0427   HDL 47  09/20/2020 1132   HDL 53 09/06/2013 1518   CHOLHDL 4.2 04/19/2021 0427   VLDL 44 (H) 04/19/2021 0427   VLDL 35 09/06/2013 1518   LDLCALC 105 (H) 04/19/2021 0427   LDLCALC 152 (H) 09/20/2020 1132   LDLCALC 117 (H) 09/06/2013 1518   HgbA1C  Lab Results  Component Value Date   HGBA1C 9.3 (H) 04/19/2021   Urinalysis    Component Value Date/Time   COLORURINE YELLOW (A) 03/23/2018 1422   APPEARANCEUR HAZY (  A) 03/23/2018 1422   LABSPEC 1.013 03/23/2018 1422   PHURINE 5.0 03/23/2018 1422   GLUCOSEU NEGATIVE 03/23/2018 1422   HGBUR SMALL (A) 03/23/2018 1422   BILIRUBINUR small 11/16/2019 1516   KETONESUR NEGATIVE 03/23/2018 1422   PROTEINUR Positive (A) 11/16/2019 1516   PROTEINUR NEGATIVE 03/23/2018 1422   UROBILINOGEN 1.0 11/16/2019 1516   NITRITE negative 11/16/2019 1516   NITRITE NEGATIVE 03/23/2018 1422   LEUKOCYTESUR Large (3+) (A) 11/16/2019 1516   Urine Drug Screen No results found for: LABOPIA, COCAINSCRNUR, LABBENZ, AMPHETMU, THCU, LABBARB  Alcohol Level No results found for: [facility]   SIGNIFICANT DIAGNOSTIC STUDIES MR ANGIO HEAD WO CONTRAST  Result Date: 04/18/2021 CLINICAL DATA:  Possible stroke.  Left arm weakness and numbness EXAM: MRI HEAD WITHOUT CONTRAST MRA HEAD WITHOUT CONTRAST TECHNIQUE: Multiplanar, multiecho pulse sequences of the brain and surrounding structures were obtained without intravenous contrast. Angiographic images of the head were obtained using MRA technique without contrast. COMPARISON:  None. FINDINGS: MRI HEAD FINDINGS Brain: Small acute infarct of the anterolateral right thalamus adjacent to the posterior limb of the right internal capsule. No other acute ischemia. No acute or chronic hemorrhage. Normal white matter signal, parenchymal volume and CSF spaces. The midline structures are normal. Vascular: Major flow voids are preserved. Skull and upper cervical spine: Normal calvarium and skull base. Visualized upper cervical spine and soft tissues are  normal. Sinuses/Orbits:No paranasal sinus fluid levels or advanced mucosal thickening. No mastoid or middle ear effusion. Normal orbits. MRA HEAD FINDINGS POSTERIOR CIRCULATION: --Vertebral arteries: Loss of normal flow related enhancement within the V4 segment of the right vertebral artery. The area of enhancement at the most distal aspect of the V4 segment is likely due to retrograde flow across the vertebrobasilar junction. Normal left V4 segment. --Inferior cerebellar arteries: Normal. --Basilar artery: Normal. --Superior cerebellar arteries: Normal. --Posterior cerebral arteries: Right PCA mid P1 segment occlusion. Left PCA is normal. ANTERIOR CIRCULATION: --Intracranial internal carotid arteries: Normal. --Anterior cerebral arteries (ACA): Normal. --Middle cerebral arteries (MCA): Normal. ANATOMIC VARIANTS: None IMPRESSION: 1. Small acute infarct of the anterolateral right thalamus adjacent to the posterior limb of the right internal capsule. 2. Right PCA mid P1 segment occlusion. Loss of normal flow related enhancement in the right vertebral artery V4 segment. These results were called by telephone at the time of interpretation on 04/18/2021 at 10:26 pm to provider [facility] , who verbally acknowledged these results. Electronically Signed   By: Ulyses Jarred M.D.   On: 04/18/2021 22:26   MR BRAIN WO CONTRAST  Result Date: 04/19/2021 CLINICAL DATA:  Stroke, follow-up. Status post recanalization of right P2 occlusion. EXAM: MRI HEAD WITHOUT CONTRAST TECHNIQUE: Multiplanar, multiecho pulse sequences of the brain and surrounding structures were obtained without intravenous contrast. COMPARISON:  MR head 04/18/2021 FINDINGS: Brain: The diffusion-weighted images again demonstrate an acute nonhemorrhagic infarct within the lateral aspect of the right thalamus. Additional acute nonhemorrhagic infarct is now present within the more superomedial right thalamus. A punctate nonhemorrhagic infarct is present in the  left thalamus. Punctate infarct noted in the right cerebral peduncle. Scattered areas of restricted diffusion are present in the medial right occipital lobe. Acute punctate nonhemorrhagic infarct is present in the left side of the splenium of the corpus callosum. A punctate acute infarct is suspected in the right cerebellum. T2 signal changes are associated with these areas. No acute hemorrhage is present. No significant mass effect is present. Periventricular white matter changes are otherwise stable. No significant extraaxial fluid collection is  present. The brainstem and cerebellum are otherwise normal. Vascular: Flow is present in the major intracranial arteries. Flow void better demonstrated in the proximal right PCA. Skull and upper cervical spine: The craniocervical junction is normal. Upper cervical spine is within normal limits. Marrow signal is unremarkable. Sinuses/Orbits: The paranasal sinuses and mastoid air cells are clear. Bilateral lens replacements are noted. Globes and orbits are otherwise unremarkable. IMPRESSION: 1. Stable acute nonhemorrhagic infarct involving the lateral aspect of the right thalamus. 2. Additional acute nonhemorrhagic infarcts involving the more superior and medial right thalamus, the right occipital lobe, the left side of the splenium of the corpus callosum, and the right cerebellum. 3. Stable white matter disease. This likely reflects the sequela of chronic microvascular ischemia. Electronically Signed   By: San Morelle M.D.   On: 04/19/2021 17:32   MR BRAIN WO CONTRAST  Result Date: 04/18/2021 CLINICAL DATA:  Possible stroke.  Left arm weakness and numbness EXAM: MRI HEAD WITHOUT CONTRAST MRA HEAD WITHOUT CONTRAST TECHNIQUE: Multiplanar, multiecho pulse sequences of the brain and surrounding structures were obtained without intravenous contrast. Angiographic images of the head were obtained using MRA technique without contrast. COMPARISON:  None. FINDINGS: MRI  HEAD FINDINGS Brain: Small acute infarct of the anterolateral right thalamus adjacent to the posterior limb of the right internal capsule. No other acute ischemia. No acute or chronic hemorrhage. Normal white matter signal, parenchymal volume and CSF spaces. The midline structures are normal. Vascular: Major flow voids are preserved. Skull and upper cervical spine: Normal calvarium and skull base. Visualized upper cervical spine and soft tissues are normal. Sinuses/Orbits:No paranasal sinus fluid levels or advanced mucosal thickening. No mastoid or middle ear effusion. Normal orbits. MRA HEAD FINDINGS POSTERIOR CIRCULATION: --Vertebral arteries: Loss of normal flow related enhancement within the V4 segment of the right vertebral artery. The area of enhancement at the most distal aspect of the V4 segment is likely due to retrograde flow across the vertebrobasilar junction. Normal left V4 segment. --Inferior cerebellar arteries: Normal. --Basilar artery: Normal. --Superior cerebellar arteries: Normal. --Posterior cerebral arteries: Right PCA mid P1 segment occlusion. Left PCA is normal. ANTERIOR CIRCULATION: --Intracranial internal carotid arteries: Normal. --Anterior cerebral arteries (ACA): Normal. --Middle cerebral arteries (MCA): Normal. ANATOMIC VARIANTS: None IMPRESSION: 1. Small acute infarct of the anterolateral right thalamus adjacent to the posterior limb of the right internal capsule. 2. Right PCA mid P1 segment occlusion. Loss of normal flow related enhancement in the right vertebral artery V4 segment. These results were called by telephone at the time of interpretation on 04/18/2021 at 10:26 pm to provider [facility] , who verbally acknowledged these results. Electronically Signed   By: Ulyses Jarred M.D.   On: 04/18/2021 22:26   IR CT Head Ltd  Result Date: 04/21/2021 INDICATION: 65 year old woman, right-hand-dominant, with a past medical history significant for poorly controlled diabetes,  hypertension, hyperlipidemia, obesity, right hip replacement, anxiety/depression, presenting with acute onset dizziness, left-sided weakness, left-sided numbness, NIHSS 5. Her last known well was at 6 p.m. on 04/18/2021. She received IV tPA at 7:10 p.m. on 04/18/2021. Initially, she was thought to have a lacunar infarct. However, subsequent MRI/MRA of the head showed a right P1/PCA occlusion. Subsequently, patient patient was found to have worsening neglect on the left side with possible visual field deficit. CT/CT angiogram of the head and confirmed a right PCA 1/PCA occlusion and CT perfusion showed a 13 mL penumbra in the right PCA territory. After long discussion held by Dr. Amie Portland, MD, patient and  her son, decision 1 made to proceed with a diagnostic cerebral angiogram and mechanical thrombectomy. EXAM: FLUOROSCOPY GUIDED VASCULAR ACCESS DIAGNOSTIC CEREBRAL ANGIOGRAM MECHANICAL THROMBECTOMY FLAT PANEL HEAD CT COMPARISON:  CT/CT angiogram of the head and neck Apr 18, 2021 MEDICATIONS: Refer to anesthesia documentation. ANESTHESIA/SEDATION: The procedure was performed in the general anesthesia. CONTRAST:  40 mL of Omnipaque 240 mg/mL. FLUOROSCOPY TIME:  Fluoroscopy Time: 14 minutes 42 seconds (543 mGy). COMPLICATIONS: None immediate. TECHNIQUE: Informed written consent was obtained from the patient and her son after a thorough discussion of the procedural risks, benefits and alternatives. All questions were addressed. Maximal Sterile Barrier Technique was utilized including caps, mask, sterile gowns, sterile gloves, sterile drape, hand hygiene and skin antiseptic. A timeout was performed prior to the initiation of the procedure. Using the modified Seldinger technique and a micropuncture kit, access was gained to the distal left radial artery at the anatomical snuffbox and a 6 French sheath was placed. Slow intra arterial infusion of 979 mcg nitroglicerin diluted in patient's own blood was performed. No  significant fluctuation in patient's blood pressure seen. Then, a left radial artery roadmap was obtained via sheath side port. Next, a benchmark guide catheter was navigated over a Berenstein 2 catheter and a 0.035 " Terumo Glidewire into the left subclavian artery under fluoroscopic guidance. Frontal and lateral angiograms of the neck were obtained. The catheter was then navigated over the wire into the distal V2 segment of the left vertebral artery. Frontal and lateral views of the head were obtained. FINDINGS: 1. Normal brachial artery branching pattern seen. No significant anatomical variation. The left radial artery caliber is adequate for vascular access. 2. Atherosclerotic changes of the left subclavian artery with approximately 60% stenosis proximal to the origin of the left vertebral artery. 3. Occlusion of the proximal right P2/PCA. PROCEDURE: Under biplane roadmap, a 5 [place] Sof[place] aspiration catheter was navigated over a phenom 21 microcatheter and a synchro support microguidewire into the basilar artery. The microcatheter was then navigated over the wire into the right P3/PCA segment. Then, a 5 x 37 mm embotrap stent retriever was deployed spanning the right P1 and P2 segments. The device was allowed to intercalated with the clot for 4 minutes. The microcatheter was removed. The aspiration catheter was advanced to the level of occlusion and connected to a penumbra aspiration pump. The thrombectomy device and aspiration catheter were removed under constant aspiration. Right vertebral artery angiogram showed recanalization of the right PCA with slow flow at the P4 segment (TICI 2C). Flat panel CT of the head was obtained and post processed in a separate workstation with concurrent attending physician supervision. Selected images were sent to PACS. No evidence of hemorrhagic complication noted. The catheter was subsequently withdrawn. An inflatable band was placed and inflated over the left hand access  site. The vascular sheath was withdrawn and the band was slowly deflated until brisk flow was noted through the arteriotomy site. At this point, the band was reinflated with additional 2 cc of air to obtain patent hemostasis. IMPRESSION: Successful mechanical thrombectomy with combined stent retriever and aspiration for treatment of a right P2/PCA occlusion with complete recanalization (TICI2C). PLAN: 1. Deflation of radial pain per protocol. 2. Transfer to ICU for further care. 3. Goal SBP 120-140 mg/Hg. Electronically Signed   By: Pedro Earls M.D.   On: 04/21/2021 16:02   IR US Guide Vasc Access Left  Result Date: 04/21/2021 INDICATION: 65 year old woman, right-hand-dominant, with a past medical history significant for  poorly controlled diabetes, hypertension, hyperlipidemia, obesity, right hip replacement, anxiety/depression, presenting with acute onset dizziness, left-sided weakness, left-sided numbness, NIHSS 5. Her last known well was at 6 p.m. on 04/18/2021. She received IV tPA at 7:10 p.m. on 04/18/2021. Initially, she was thought to have a lacunar infarct. However, subsequent MRI/MRA of the head showed a right P1/PCA occlusion. Subsequently, patient patient was found to have worsening neglect on the left side with possible visual field deficit. CT/CT angiogram of the head and confirmed a right PCA 1/PCA occlusion and CT perfusion showed a 13 mL penumbra in the right PCA territory. After long discussion held by Dr. Amie Portland, MD, patient and her son, decision 1 made to proceed with a diagnostic cerebral angiogram and mechanical thrombectomy. EXAM: FLUOROSCOPY GUIDED VASCULAR ACCESS DIAGNOSTIC CEREBRAL ANGIOGRAM MECHANICAL THROMBECTOMY FLAT PANEL HEAD CT COMPARISON:  CT/CT angiogram of the head and neck Apr 18, 2021 MEDICATIONS: Refer to anesthesia documentation. ANESTHESIA/SEDATION: The procedure was performed in the general anesthesia. CONTRAST:  40 mL of Omnipaque 240 mg/mL.  FLUOROSCOPY TIME:  Fluoroscopy Time: 14 minutes 42 seconds (543 mGy). COMPLICATIONS: None immediate. TECHNIQUE: Informed written consent was obtained from the patient and her son after a thorough discussion of the procedural risks, benefits and alternatives. All questions were addressed. Maximal Sterile Barrier Technique was utilized including caps, mask, sterile gowns, sterile gloves, sterile drape, hand hygiene and skin antiseptic. A timeout was performed prior to the initiation of the procedure. Using the modified Seldinger technique and a micropuncture kit, access was gained to the distal left radial artery at the anatomical snuffbox and a 6 French sheath was placed. Slow intra arterial infusion of 270 mcg nitroglicerin diluted in patient's own blood was performed. No significant fluctuation in patient's blood pressure seen. Then, a left radial artery roadmap was obtained via sheath side port. Next, a benchmark guide catheter was navigated over a Berenstein 2 catheter and a 0.035 " Terumo Glidewire into the left subclavian artery under fluoroscopic guidance. Frontal and lateral angiograms of the neck were obtained. The catheter was then navigated over the wire into the distal V2 segment of the left vertebral artery. Frontal and lateral views of the head were obtained. FINDINGS: 1. Normal brachial artery branching pattern seen. No significant anatomical variation. The left radial artery caliber is adequate for vascular access. 2. Atherosclerotic changes of the left subclavian artery with approximately 60% stenosis proximal to the origin of the left vertebral artery. 3. Occlusion of the proximal right P2/PCA. PROCEDURE: Under biplane roadmap, a 5 [place] Sof[place] aspiration catheter was navigated over a phenom 21 microcatheter and a synchro support microguidewire into the basilar artery. The microcatheter was then navigated over the wire into the right P3/PCA segment. Then, a 5 x 37 mm embotrap stent retriever was  deployed spanning the right P1 and P2 segments. The device was allowed to intercalated with the clot for 4 minutes. The microcatheter was removed. The aspiration catheter was advanced to the level of occlusion and connected to a penumbra aspiration pump. The thrombectomy device and aspiration catheter were removed under constant aspiration. Right vertebral artery angiogram showed recanalization of the right PCA with slow flow at the P4 segment (TICI 2C). Flat panel CT of the head was obtained and post processed in a separate workstation with concurrent attending physician supervision. Selected images were sent to PACS. No evidence of hemorrhagic complication noted. The catheter was subsequently withdrawn. An inflatable band was placed and inflated over the left hand access site. The vascular sheath  was withdrawn and the band was slowly deflated until brisk flow was noted through the arteriotomy site. At this point, the band was reinflated with additional 2 cc of air to obtain patent hemostasis. IMPRESSION: Successful mechanical thrombectomy with combined stent retriever and aspiration for treatment of a right P2/PCA occlusion with complete recanalization (TICI2C). PLAN: 1. Deflation of radial pain per protocol. 2. Transfer to ICU for further care. 3. Goal SBP 120-140 mg/Hg. Electronically Signed   By: Pedro Earls M.D.   On: 04/21/2021 16:02   CT CORONARY MORPH W/CTA COR W/SCORE W/CA W/CM &/OR WO/CM  Result Date: 04/25/2021 CLINICAL DATA:  65 Year-old Caucasian female with known aortic mass EXAM: Cardiac/Coronary  CTA TECHNIQUE: The patient was scanned on a [employer]. FINDINGS: A 100 kV prospective scan was triggered in the descending thoracic aorta at 111 HU's. Axial non-contrast 3 mm slices were carried out through the heart. The data set was analyzed on a dedicated work station and scored using the [place]. Gantry rotation speed was 250 msecs and collimation was .6 mm. No  beta blockade and 0.8 mg of sl NTG was given. The 3D data set was reconstructed in 5% intervals of the 67-82 % of the R-R cycle. Diastolic phases were analyzed on a dedicated work station using MPR, MIP and VRT modes. The patient received 95 cc of contrast. Aorta: Normal size. Minimal ascending aortic calcification noted. No dissection. Main Pulmonary Artery: Normal size of the pulmonary artery. Aortic Valve: Tri-leaflet. There is a well defined, pedunculated, mobile, clover-shaped density (Hounsfield units 60-100; 14 mm X 8 mm) that appears to attach to the leaflet of the right aortic valve leaflet. Coronary Arteries:  Normal coronary origin.  Right dominance. Coronary Calcium Score: Left main: 0 Left anterior descending artery: 851 Left circumflex artery: 749 Right coronary artery: 304 Total: 1904 Percentile: 99th for age, sex, and race matched control. RCA is a large dominant artery that gives rise to PDA and PLA. There is a mild non-obstructive (25-49%) calcified lesion in the proximal RCA. There is a moderate stenosis mixed plaque with a napkin ring sign in the mRCA (50-70%). There are many minimal non-obstructive calcific lesions throughout the vessel. There appears to be a severe stenosis (70-99%) calcified lesion in the distal RCA. Left main is a large artery that gives rise to LAD and LCX arteries. There is no plaque. LAD is a large vessel that gives rise to two large diagonal branches. There is a 22 mm long calcific lesion that is mild non-obstructive (25-49%) from the LAD ostium into the mLAD. In the mLAD, this becomes a moderate stenosis (50-70%). There is a moderate stenosis (50-70%) calcified lesion in the proximal D1. There is a mild non-obstructive (25-49%) calcified lesion in the proximal D2. LCX is a non-dominant artery. There is a 11 mm long calcific lesion that is a moderate stenosis (50-70%) from the LCx ostium into the mLCx. In the [facility], this becomes a moderate stenosis (50-70% soft plaque).  There is a minimal non-obstructive (1-24%) calcified lesions in the mid and distal vessel. Other findings: Normal pulmonary vein drainage into the left atrium. Normal left atrial appendage without a thrombus. Mild to moderate mitral annular calcification noted. Extra-cardiac findings: See attached radiology report for non-cardiac structures. IMPRESSION: 1. Coronary calcium score of 1904. This was 99th percentile for age, sex, and race matched control. 2. Normal coronary origin with right dominance. 3. CAD-RADS 4 Severe stenosis. (70-99% or > 50% left main). CT FFR  is recommended and will be sent. Consider symptom-guided anti-ischemic pharmacotherapy as well as risk factor modification per guideline directed care. 4.  Aortic atherosclerosis noted. 5. 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 6. Mild to moderate mitral annular calcification noted. RECOMMENDATIONS: Coronary artery calcium (CAC) score is a strong predictor of incident coronary heart disease (CHD) and provides predictive information beyond traditional risk factors. CAC scoring is reasonable to use in the decision to withhold, postpone, or initiate statin therapy in intermediate-risk or selected borderline-risk asymptomatic adults (age 75-75 years and LDL-C >=70 to <190 mg/dL) who do not have diabetes or established atherosclerotic cardiovascular disease (ASCVD).* In intermediate-risk (10-year ASCVD risk >=7.5% to <20%) adults or selected borderline-risk (10-year ASCVD risk >=5% to <7.5%) adults in whom a CAC score is measured for the purpose of making a treatment decision the following recommendations have been made: If CAC = 0, it is reasonable to withhold statin therapy and reassess in 5 to 10 years, as long as higher risk conditions are absent (diabetes mellitus, family history of premature CHD in first degree relatives (males <55 years; females <65 years), cigarette  smoking, LDL >=190 mg/dL or other independent risk factors). If CAC is 1 to 99, it is reasonable to initiate statin therapy for patients >= 6 years of age. If CAC is >=100 or >=75th percentile, it is reasonable to initiate statin therapy at any age. Cardiology referral should be considered for patients with CAC scores =400 or >=75th percentile. *2018 AHA/ACC/AACVPR/AAPA/ABC/ACPM/ADA/AGS/APhA/ASPC/NLA/PCNA Guideline on the Management of Blood Cholesterol: A Report of the American College of Cardiology/American Heart Association Task Force on Clinical Practice Guidelines. J Am Coll Cardiol. 2019;73(24):3168-3209. Rudean Haskell, MD Electronically Signed   By: Rudean Haskell MD   On: 04/25/2021 16:00   DG CHEST PORT 1 VIEW  Result Date: 04/28/2021 CLINICAL DATA:  Cough. EXAM: PORTABLE CHEST 1 VIEW COMPARISON:  02/25/2016 FINDINGS: The lungs are clear without focal pneumonia, edema, pneumothorax or pleural effusion. The cardiopericardial silhouette is within normal limits for size. The visualized bony structures of the thorax show no acute abnormality. IMPRESSION: No active disease. Electronically Signed   By: Misty Stanley M.D.   On: 04/28/2021 12:08   DG Swallowing Func-Speech Pathology  Result Date: 04/28/2021 Objective Swallowing Evaluation: Type of Study: MBS-Modified Barium Swallow Study  Patient Details Name: SHEZA STRICKLAND MRN: [id number] Date of Birth: [birth year] Today's Date: 04/28/2021 Time: SLP Start Time (ACUTE ONLY): 1415 -SLP Stop Time (ACUTE ONLY): 1428 SLP Time Calculation (min) (ACUTE ONLY): 13 min Past Medical History: Past Medical History: Diagnosis Date . Anxiety  . Arthritis  . Cataract   left . Depression  . Diabetes mellitus without complication ([place])  . Environmental and seasonal allergies  . GERD (gastroesophageal reflux disease)  . Hyperlipidemia  . Hypertension  . Joint pain   as reported by patient . Urinary incontinence   as stated by patient Past Surgical History:  Past Surgical History: Procedure Laterality Date . BREAST EXCISIONAL BIOPSY Right 1999  neg . BREAST LUMPECTOMY Right 2005  as reported by patient . BUBBLE STUDY  04/24/2021  Procedure: BUBBLE STUDY;  Surgeon: Sueanne Margarita, MD;  Location: [place];  Service: Cardiovascular;; . CATARACT EXTRACTION  January 2013 . CATARACT EXTRACTION W/PHACO Left 12/02/2016  Procedure: CATARACT EXTRACTION PHACO AND INTRAOCULAR LENS PLACEMENT (IOC);  Surgeon: Estill Cotta, MD;  Location: [facility];  Service: Ophthalmology;  Laterality: Left;  [place] 2:14 AP%  28.4 CDE 59.73 Fluid pack lot # [id number] . DIAGNOSTIC LAPAROSCOPY   . IR CT HEAD LTD  04/19/2021 . IR PERCUTANEOUS ART THROMBECTOMY/INFUSION INTRACRANIAL INC DIAG ANGIO  04/19/2021    . IR PERCUTANEOUS ART THROMBECTOMY/INFUSION INTRACRANIAL INC DIAG ANGIO  04/19/2021 . IR US GUIDE VASC ACCESS LEFT  04/19/2021 . RADIOLOGY WITH ANESTHESIA N/A 04/19/2021  Procedure: IR WITH ANESTHESIA;  Surgeon: Luanne Bras, MD;  Location: [place];  Service: Radiology;  Laterality: N/A; . TEE WITHOUT CARDIOVERSION N/A 04/24/2021  Procedure: TRANSESOPHAGEAL ECHOCARDIOGRAM (TEE);  Surgeon: Sueanne Margarita, MD;  Location: [facility] END[facility];  Service: Cardiovascular;  Laterality: N/A; . TOTAL HIP ARTHROPLASTY Right 04/04/2018  Procedure: TOTAL HIP ARTHROPLASTY;  Surgeon: Dereck Leep, MD;  Location: [facility];  Service: Orthopedics;  Laterality: Right; HPI: [name] is a 65 y.o. female with history of poorly controlled diabetes and hypertension, hyperlipidemia, obesity, anxiety, depression, presenting with acute dizziness left-sided weakness, left-sided numbness, and possible visual deficits. She received IV tPA  via telemedicine at [place] [facility] 04/18/2021 and was found to acute nonhemorrhagic lateral aspect of the right thalamus. Acute nonhemorrhagic infarcts involving the more superior and medial right thalamus, the right occipital lobe, the left side of the splenium of the corpus callosum, and the  right cerebellum - embolic - etiology unknown. Initially passed Yale on 5/7, but has been coughing often, and repeat Yale administed on 5/16 which she failed due to coughing.  Subjective: pt awake in bed Assessment / Plan / Recommendation CHL IP CLINICAL IMPRESSIONS 04/28/2021 Clinical Impression  Pt demonstrates no aspiration or dysphagia though she did cough somewhat often during assessment, particularly at the end. When questioned about reflux, pt both denied it and told a story about having bad heartburn with certain foods. Esophageal sweep unrevealing. Reported findings to NP. Pt may continue a regular diet and thin liquids. SLP Visit Diagnosis Dysphagia, unspecified (R13.10) Attention and concentration deficit following -- Frontal lobe and executive function deficit following -- Impact on safety and function Mild aspiration risk   CHL IP TREATMENT RECOMMENDATION 04/28/2021 Treatment Recommendations No treatment recommended at this time   No flowsheet data found. CHL IP DIET RECOMMENDATION 04/28/2021 SLP Diet Recommendations Regular solids;Thin liquid Liquid Administration via Cup;Straw Medication Administration Whole meds with liquid Compensations Slow rate;Small sips/bites Postural Changes Remain semi-upright after after feeds/meals (Comment);Seated upright at 90 degrees   CHL IP OTHER RECOMMENDATIONS 04/28/2021 Recommended Consults -- Oral Care Recommendations Oral care BID Other Recommendations --   CHL IP FOLLOW UP RECOMMENDATIONS 04/28/2021 Follow up Recommendations Inpatient Rehab   No flowsheet data found.     CHL IP ORAL PHASE 04/28/2021 Oral Phase WFL Oral - Pudding Teaspoon -- Oral - Pudding Cup -- Oral - Honey Teaspoon -- Oral - Honey Cup -- Oral - Nectar Teaspoon -- Oral - Nectar Cup -- Oral - Nectar Straw -- Oral - Thin Teaspoon -- Oral - Thin Cup -- Oral - Thin Straw -- Oral - Puree -- Oral - Mech Soft -- Oral - Regular -- Oral - Multi-Consistency -- Oral - Pill -- Oral Phase - Comment --  CHL IP  PHARYNGEAL PHASE 04/28/2021 Pharyngeal Phase WFL Pharyngeal- Pudding Teaspoon -- Pharyngeal -- Pharyngeal- Pudding Cup -- Pharyngeal -- Pharyngeal- Honey Teaspoon -- Pharyngeal -- Pharyngeal- Honey Cup -- Pharyngeal -- Pharyngeal- Nectar Teaspoon -- Pharyngeal -- Pharyngeal- Nectar Cup -- Pharyngeal -- Pharyngeal- Nectar Straw -- Pharyngeal -- Pharyngeal- Thin Teaspoon -- Pharyngeal -- Pharyngeal- Thin Cup -- Pharyngeal -- Pharyngeal- Thin Straw --  Pharyngeal -- Pharyngeal- Puree -- Pharyngeal -- Pharyngeal- Mechanical Soft -- Pharyngeal -- Pharyngeal- Regular -- Pharyngeal -- Pharyngeal- Multi-consistency -- Pharyngeal -- Pharyngeal- Pill -- Pharyngeal -- Pharyngeal Comment --  No flowsheet data found. DeBlois, Katherene Ponto 04/28/2021, 2:42 PM              CT CORONARY FRACTIONAL FLOW RESERVE DATA PREP  Result Date: 04/28/2021 EXAM: CT-FFR ANALYSIS CLINICAL DATA:  Possibly obstructive coronary lesion (mLAD, dRCA, mD1 mLcx). FINDINGS: CT-FFR analysis was performed on the original cardiac CT angiogram dataset. Diagrammatic representation of the CT-FFR analysis is provided in a separate PDF document in PACS. This dictation was created using the PDF document and an interactive 3D model of the results. 3D model is not available in the EMR/PACS. Normal FFR range is >0.80. 1. Left Main: No significant functional stenosis, CT-FFR 0.98. 2. LAD: Significant functional stenosis, CT-FFR both in the mLAD (CT-FFR 0.71), and mD1 (CT-FFR 0.72). 3. LCX: Possible significant functional stenosis, mLcx (CT-FFR 0.84) and significant functional stenosis in the distal vessel (CT-FFR 0.79). 4. RCA: Unable to model due to artifact; even with scan optimization. IMPRESSION: 1. CT FFR analysis shows significant functional stenosis in the mLAD, mD1, with only possible significant functional stenosis of the mLCx. Unable to further characterize the mRCA. Rudean Haskell MD Electronically Signed   By: Rudean Haskell MD   On:  04/28/2021 11:34   CT ANGIO CHEST AORTA W/CM & OR WO/CM  Result Date: 04/25/2021 CLINICAL DATA:  Thoracic aortic disease.  Preop planning. EXAM: CT ANGIOGRAPHY CHEST WITH CONTRAST TECHNIQUE: Multidetector CT imaging of the chest was performed using the standard protocol during bolus administration of intravenous contrast. Multiplanar CT image reconstructions and MIPs were obtained to evaluate the vascular anatomy. CONTRAST:  45m OMNIPAQUE IOHEXOL 350 MG/ML SOLN COMPARISON:  02/25/2016 chest radiograph. FINDINGS: Cardiovascular: Aortic and branch vessel atherosclerosis. Normal caliber of the great vessels. No evidence of thoracic aortic aneurysm or dissection. Mild aortic atherosclerosis throughout. Poor evaluation for pulmonary embolism secondary to bolus timing. Mild cardiomegaly, without pericardial effusion. Three vessel coronary artery calcification. Mediastinum/Nodes: No mediastinal or hilar adenopathy. Lungs/Pleura: No pleural fluid. Minimal exclusion of the lung apices and costophrenic sulci. Mild centrilobular emphysema. Upper Abdomen: Mild hepatic steatosis. Normal imaged portions of the spleen, stomach. Musculoskeletal: No acute osseous abnormality. Review of the MIP images confirms the above findings. IMPRESSION: 1. No evidence of thoracic aortic aneurysm or dissection. 2.  No acute process in the chest. 3. Coronary artery atherosclerosis. Aortic Atherosclerosis (ICD10-I70.0). 4. Mild hepatic steatosis. Electronically Signed   By: KAbigail MiyamotoM.D.   On: 04/25/2021 16:09   ECHOCARDIOGRAM COMPLETE  Result Date: 04/19/2021    ECHOCARDIOGRAM REPORT   Patient Name:   TDoreatha MartinDate of Exam: 04/19/2021 Medical Rec #:  [id number]      Height:       68.0 in Accession #:    [id number]     Weight:       220.0 lb Date of Birth:  [birth year]     BSA:          2.128 m Patient Age:    89years        BP:           132/69 mmHg Patient Gender: F               HR:           87 bpm. Exam Location:  Inpatient  Procedure: 2D  Echo, Cardiac Doppler and Color Doppler Indications:    CVA  History:        Patient has no prior history of Echocardiogram examinations.                 Risk Factors:Dyslipidemia, Diabetes and Hypertension.  Sonographer:    Luisa Hart RDCS Referring Phys: [id number] ASHISH ARORA IMPRESSIONS  1. Left ventricular ejection fraction, by estimation, is 70 to 75%. The left ventricle has hyperdynamic function. The left ventricle has no regional wall motion abnormalities. Left ventricular diastolic parameters are consistent with Grade I diastolic dysfunction (impaired relaxation). Elevated left atrial pressure.  2. Right ventricular systolic function is normal. The right ventricular size is normal. Tricuspid regurgitation signal is inadequate for assessing PA pressure.  3. Left atrial size was mildly dilated.  4. The mitral valve is normal in structure. Mild mitral valve regurgitation. No evidence of mitral stenosis.  5. The aortic valve is normal in structure. Aortic valve regurgitation is not visualized. No aortic stenosis is present.  6. The inferior vena cava is normal in size with greater than 50% respiratory variability, suggesting right atrial pressure of 3 mmHg. FINDINGS  Left Ventricle: Left ventricular ejection fraction, by estimation, is 70 to 75%. The left ventricle has hyperdynamic function. The left ventricle has no regional wall motion abnormalities. The left ventricular internal cavity size was normal in size. There is no left ventricular hypertrophy. Left ventricular diastolic parameters are consistent with Grade I diastolic dysfunction (impaired relaxation). Elevated left atrial pressure. Right Ventricle: The right ventricular size is normal. No increase in right ventricular wall thickness. Right ventricular systolic function is normal. Tricuspid regurgitation signal is inadequate for assessing PA pressure. Left Atrium: Left atrial size was mildly dilated. Right Atrium: Right atrial size was  normal in size. Pericardium: There is no evidence of pericardial effusion. Mitral Valve: The mitral valve is normal in structure. Mild mitral annular calcification. Mild mitral valve regurgitation. No evidence of mitral valve stenosis. Tricuspid Valve: The tricuspid valve is normal in structure. Tricuspid valve regurgitation is not demonstrated. No evidence of tricuspid stenosis. Aortic Valve: The aortic valve is normal in structure. Aortic valve regurgitation is not visualized. No aortic stenosis is present. Aortic valve mean gradient measures 7.0 mmHg. Aortic valve peak gradient measures 12.2 mmHg. Pulmonic Valve: The pulmonic valve was normal in structure. Pulmonic valve regurgitation is not visualized. No evidence of pulmonic stenosis. Aorta: The aortic root is normal in size and structure. Venous: The inferior vena cava is normal in size with greater than 50% respiratory variability, suggesting right atrial pressure of 3 mmHg. IAS/Shunts: No atrial level shunt detected by color flow Doppler.  LEFT VENTRICLE PLAX 2D LVIDd:         5.50 cm     Diastology LVIDs:         3.20 cm     LV e' medial:    5.77 cm/s LV PW:         1.00 cm     LV E/e' medial:  16.9 LV IVS:        1.00 cm     LV e' lateral:   5.33 cm/s                            LV E/e' lateral: 18.3  LV Volumes (MOD) LV vol d, MOD A2C: 57.3 ml LV vol d, MOD A4C: 83.9 ml LV vol s, MOD A2C: 7.2 ml LV  vol s, MOD A4C: 18.5 ml LV SV MOD A2C:     50.1 ml LV SV MOD A4C:     83.9 ml LV SV MOD BP:      60.2 ml RIGHT VENTRICLE RV S prime:     17.10 cm/s  PULMONARY VEINS TAPSE (M-mode): 2.7 cm      A Reversal Duration: 85.00 msec                             A Reversal Velocity: 17.80 cm/s                             Diastolic Velocity:  07.37 cm/s                             S/D Velocity:        1.50                             Systolic Velocity:   10.62 cm/s LEFT ATRIUM           Index       RIGHT ATRIUM           Index LA diam:      4.10 cm 1.93 cm/m  RA Area:      11.90 cm LA Vol (A2C): 59.4 ml 27.91 ml/m RA Volume:   25.00 ml  11.75 ml/m  AORTIC VALVE                    PULMONIC VALVE AV Vmax:           175.00 cm/s  PV Vmax:       1.27 m/s AV Vmean:          124.000 cm/s PV Vmean:      98.400 cm/s AV VTI:            0.360 m      PV VTI:        0.249 m AV Peak Grad:      12.2 mmHg    PV Peak grad:  6.5 mmHg AV Mean Grad:      7.0 mmHg     PV Mean grad:  4.0 mmHg LVOT Vmax:         98.40 cm/s LVOT Vmean:        73.100 cm/s LVOT VTI:          0.225 m LVOT/AV VTI ratio: 0.62  AORTA Ao Root diam: 2.80 cm Ao Asc diam:  3.00 cm MITRAL VALVE MV Area (PHT): 3.72 cm     SHUNTS MV Decel Time: 204 msec     Systemic VTI: 0.22 m MV E velocity: 97.30 cm/s MV A velocity: 150.00 cm/s MV E/A ratio:  0.65 Mihai Croitoru MD Electronically signed by Sanda Klein MD Signature Date/Time: 04/19/2021/2:58:20 PM    Final    IR PERCUTANEOUS ART THROMBECTOMY/INFUSION INTRACRANIAL INC DIAG ANGIO  Result Date: 04/21/2021 INDICATION: 65 year old woman, right-hand-dominant, with a past medical history significant for poorly controlled diabetes, hypertension, hyperlipidemia, obesity, right hip replacement, anxiety/depression, presenting with acute onset dizziness, left-sided weakness, left-sided numbness, NIHSS 5. Her last known well was at 6 p.m. on 04/18/2021. She received IV tPA at 7:10 p.m. on 04/18/2021. Initially, she was thought to have a lacunar infarct. However, subsequent  MRI/MRA of the head showed a right P1/PCA occlusion. Subsequently, patient patient was found to have worsening neglect on the left side with possible visual field deficit. CT/CT angiogram of the head and confirmed a right PCA 1/PCA occlusion and CT perfusion showed a 13 mL penumbra in the right PCA territory. After long discussion held by Dr. Amie Portland, MD, patient and her son, decision 1 made to proceed with a diagnostic cerebral angiogram and mechanical thrombectomy. EXAM: FLUOROSCOPY GUIDED VASCULAR ACCESS DIAGNOSTIC  CEREBRAL ANGIOGRAM MECHANICAL THROMBECTOMY FLAT PANEL HEAD CT COMPARISON:  CT/CT angiogram of the head and neck Apr 18, 2021 MEDICATIONS: Refer to anesthesia documentation. ANESTHESIA/SEDATION: The procedure was performed in the general anesthesia. CONTRAST:  40 mL of Omnipaque 240 mg/mL. FLUOROSCOPY TIME:  Fluoroscopy Time: 14 minutes 42 seconds (543 mGy). COMPLICATIONS: None immediate. TECHNIQUE: Informed written consent was obtained from the patient and her son after a thorough discussion of the procedural risks, benefits and alternatives. All questions were addressed. Maximal Sterile Barrier Technique was utilized including caps, mask, sterile gowns, sterile gloves, sterile drape, hand hygiene and skin antiseptic. A timeout was performed prior to the initiation of the procedure. Using the modified Seldinger technique and a micropuncture kit, access was gained to the distal left radial artery at the anatomical snuffbox and a 6 French sheath was placed. Slow intra arterial infusion of 619 mcg nitroglicerin diluted in patient's own blood was performed. No significant fluctuation in patient's blood pressure seen. Then, a left radial artery roadmap was obtained via sheath side port. Next, a benchmark guide catheter was navigated over a Berenstein 2 catheter and a 0.035 " Terumo Glidewire into the left subclavian artery under fluoroscopic guidance. Frontal and lateral angiograms of the neck were obtained. The catheter was then navigated over the wire into the distal V2 segment of the left vertebral artery. Frontal and lateral views of the head were obtained. FINDINGS: 1. Normal brachial artery branching pattern seen. No significant anatomical variation. The left radial artery caliber is adequate for vascular access. 2. Atherosclerotic changes of the left subclavian artery with approximately 60% stenosis proximal to the origin of the left vertebral artery. 3. Occlusion of the proximal right P2/PCA. PROCEDURE: Under  biplane roadmap, a 5 [place] aspiration catheter was navigated over a phenom 21 microcatheter and a synchro support microguidewire into the basilar artery. The microcatheter was then navigated over the wire into the right P3/PCA segment. Then, a 5 x 37 mm embotrap stent retriever was deployed spanning the right P1 and P2 segments. The device was allowed to intercalated with the clot for 4 minutes. The microcatheter was removed. The aspiration catheter was advanced to the level of occlusion and connected to a penumbra aspiration pump. The thrombectomy device and aspiration catheter were removed under constant aspiration. Right vertebral artery angiogram showed recanalization of the right PCA with slow flow at the P4 segment (TICI 2C). Flat panel CT of the head was obtained and post processed in a separate workstation with concurrent attending physician supervision. Selected images were sent to PACS. No evidence of hemorrhagic complication noted. The catheter was subsequently withdrawn. An inflatable band was placed and inflated over the left hand access site. The vascular sheath was withdrawn and the band was slowly deflated until brisk flow was noted through the arteriotomy site. At this point, the band was reinflated with additional 2 cc of air to obtain patent hemostasis. IMPRESSION: Successful mechanical thrombectomy with combined stent retriever and aspiration for treatment of a right P2/PCA occlusion  with complete recanalization (TICI2C). PLAN: 1. Deflation of radial pain per protocol. 2. Transfer to ICU for further care. 3. Goal SBP 120-140 mg/Hg. Electronically Signed   By: Pedro Earls M.D.   On: 04/21/2021 16:02   CT HEAD CODE STROKE WO CONTRAST  Result Date: 04/18/2021 CLINICAL DATA:  Code stroke. New onset of dizziness and left-sided weakness. Last known well 45 minutes ago. EXAM: CT HEAD WITHOUT CONTRAST TECHNIQUE: Contiguous axial images were obtained from the base of the skull  through the vertex without intravenous contrast. COMPARISON:  CT head without contrast 11/05/2010 FINDINGS: Brain: No acute infarct, hemorrhage, or mass lesion is present. No significant white matter lesions are present. The ventricles are of normal size. Basal ganglia are intact. Insular ribbon is normal. No acute or focal cortical abnormality is present. No significant extraaxial fluid collection is present. Vascular: Atherosclerotic calcifications are present within the cavernous internal carotid arteries. No hyperdense vessel is present. Skull: Calvarium is intact. No focal lytic or blastic lesions are present. Sinuses/Orbits: The paranasal sinuses and mastoid air cells are clear. Bilateral lens replacements are noted. Globes and orbits are otherwise unremarkable. ASPECTS [facility] Stroke Program Early CT Score) - Ganglionic level infarction (caudate, lentiform nuclei, internal capsule, insula, M1-M3 cortex): 7/7 - Supraganglionic infarction (M4-M6 cortex): 3/3 Total score (0-10 with 10 being normal): 10/10 IMPRESSION: 1. Normal CT appearance of the brain. 2. ASPECTS is 10/10. These results were called by telephone at the time of interpretation on 04/18/2021 at 6:50 pm to provider [facility] , who verbally acknowledged these results. Electronically Signed   By: San Morelle M.D.   On: 04/18/2021 18:50   CT ANGIO HEAD NECK W WO CM W PERF (CODE STROKE)  Result Date: 04/18/2021 CLINICAL DATA:  Stroke follow-up.  Left-sided weakness EXAM: CT ANGIOGRAPHY HEAD AND NECK CT PERFUSION BRAIN TECHNIQUE: Multidetector CT imaging of the head and neck was performed using the standard protocol during bolus administration of intravenous contrast. Multiplanar CT image reconstructions and MIPs were obtained to evaluate the vascular anatomy. Carotid stenosis measurements (when applicable) are obtained utilizing NASCET criteria, using the distal internal carotid diameter as the denominator. Multiphase CT imaging of the  brain was performed following IV bolus contrast injection. Subsequent parametric perfusion maps were calculated using RAPID software. CONTRAST:  120m OMNIPAQUE IOHEXOL 350 MG/ML SOLN COMPARISON:  MRA head 04/18/2021 FINDINGS: CTA NECK FINDINGS SKELETON: There is no bony spinal canal stenosis. No lytic or blastic lesion. OTHER NECK: Normal pharynx, larynx and major salivary glands. No cervical lymphadenopathy. Unremarkable thyroid gland. UPPER CHEST: No pneumothorax or pleural effusion. No nodules or masses. AORTIC ARCH: There CS calcific atherosclerosis of the aortic arch. There is no aneurysm, dissection or hemodynamically significant stenosis of the visualized portion of the aorta. Conventional 3 vessel aortic branching pattern. Mild narrowing of the proximal left subclavian artery. RIGHT CAROTID SYSTEM: No dissection, occlusion or aneurysm. There is mixed density atherosclerosis extending into the proximal ICA, resulting in 50% stenosis. LEFT CAROTID SYSTEM: No dissection, occlusion or aneurysm. Mild atherosclerotic calcification at the carotid bifurcation without hemodynamically significant stenosis. VERTEBRAL ARTERIES: Left dominant configuration. Both origins are clearly patent. There is no dissection, occlusion or flow-limiting stenosis to the skull base (V1-V3 segments). CTA HEAD FINDINGS POSTERIOR CIRCULATION: --Vertebral arteries: The right vertebral artery terminates in PICA. The vessel originating from the proximal right basilar is the anterior inferior cerebellar artery. --Inferior cerebellar arteries: Normal. --Basilar artery: Normal. --Superior cerebellar arteries: Normal. --Posterior cerebral arteries (PCA): Right P1 segment  is occluded. Normal left PCA. ANTERIOR CIRCULATION: --Intracranial internal carotid arteries: Normal. --Anterior cerebral arteries (ACA): Normal. Both A1 segments are present. Patent anterior communicating artery (a-comm). --Middle cerebral arteries (MCA): Normal. VENOUS  SINUSES: As permitted by contrast timing, patent. ANATOMIC VARIANTS: None Review of the MIP images confirms the above findings. CT Brain Perfusion Findings: CBF (<30%) Volume: 59m Perfusion (Tmax>6.0s) volume: 127mMismatch Volume: 1377mnfarction Location:No completed infarct visible via CT perfusion. On the earlier MRI, there is a right thalamic infarct. The penumbra is located in the right occipital lobe. IMPRESSION: 1. Occlusion of the right posterior cerebral artery P1 segment with 13 mL area of ischemic penumbra in the right occipital lobe. 2. 50 % stenosis of the proximal right internal carotid artery secondary to mixed density atherosclerosis. Aortic Atherosclerosis (ICD10-I70.0). These results were communicated to Dr. AshAmie Portland 11:47 pm on 04/18/2021 by text page via the [facility] system. Electronically Signed   By: KevUlyses JarredD.   On: 04/18/2021 23:48       HISTORY OF PRESENT ILLNESS [age]o.[email or web address] has a past medical history of poorly controlled diabetes and hypertension, hyperlipidemia, obesity, anxiety, depression, presenting with acute dizziness left-sided weakness and left-sided numbness  [facility] with a last known well of 6 PM on 04/18/2021.  Patient reported some worsening-neglect on the left side on further examinations.  MRI of the brain and MRA of the head was ordered-shows a thalamic stroke on the right along with the MRA showing a possible right P1 occlusion.  CT perfusion study shows a penumbra without any core in the right PCA territory.  She received IV tPA  via telemedicine at [address]. 04/18/2021 and was found to have occlusion of the right P2/PCA and was taken to IR for thrombectomy. MT performed with combined stentriever and aspiration. Total of 1 pass with complete recanalization (TICI3). No hemorrhagic complication on flat panel head CT. Dr. KatErven Colla MacSindy MessingHOSPITAL COURSE  Stroke: acute nonhemorrhagic lateral aspect of the right  thalamus. Acute nonhemorrhagic infarcts involving the more superior and medial right thalamus, the right occipital lobe, the left side of the splenium of the corpus callosum, and the right cerebellum - embolic - etiology unknown.  Resultant dysmetria involving the left side and left-sided numbness.  Code Stroke CT Head - Normal CT appearance of the brain. ASPECTS is 10/10.   MRI head - 04/18/21 - Small acute infarct of the anterolateral right thalamus adjacent to the posterior limb of the right internal capsule.   MRI head - 04/19/21 - Stable acute nonhemorrhagic infarct involving the lateral aspect of the right thalamus. Additional acute nonhemorrhagic infarcts involving the more superior and medial right thalamus, the right occipital lobe, the left side of the splenium of the corpus callosum, and the right cerebellum. Stable white matter disease. This likely reflects the sequela of chronic microvascular ischemia.    MRA head - Right PCA mid P1 segment occlusion. Loss of normal flow related enhancement in the right vertebral artery V4 segment.  CTA H&N - 50 % stenosis of the proximal right internal carotid artery secondary to mixed density atherosclerosis.   CT Perfusion - Occlusion of the right posterior cerebral artery P1 segment with 13 mL area of ischemic penumbra in the right occipital lobe.   2D Echo - EF 70 to 75%. No cardiac source of emboli identified.   TEE  12 X 8 mm well circumscribed mass on the RCC that appears to have its attachment on the  aortic side without concomitant AI. No clot  [employer] Virus 2 - negative  LDL - 105  HgbA1c - 9.3  VTE prophylaxis - SCDs  No antithrombotic or antiplatelet prior to admission, now on  aspirin 81 mg daily and Plavix 75 mg daily x 3 weeks. Per cardiology, Dr. Gasper Sells, continue DAPT until cardiac surgery. Plavix will be held in the future pending further planning closer to surgery.    Ongoing aggressive stroke risk factor  management  Therapy recommendations:  CIR   Hypertension  Home BP meds: Cozaar and HCTZ  Current BP meds: On home cozaar, normodyne prn  Stable, hypertensive   Permissive hypertension bp less than 180  Long-term BP goal normotensive  Hyperlipidemia  Home Lipid lowering medication: none  LDL 105, goal < 70  Current lipid lowering medication: will add Lipitor 40 mg daily  Continue statin at discharge  Diabetes  Home diabetic meds: Actos 45, Januvia 100, Glucotrol 5 mg,  Metformin 109m  bid  Current diabetic meds: insulin  HgbA1c 9.3, goal < 7.0 Recent Labs (last 2 labs)         Recent Labs      04/27/21  2118  04/28/21  0639  04/28/21  1125   GLUCAP  88  123*  318*     Diabetes coordinator consult appreciated, will add Levimir 10units BID, Novolog meal time coverage of 3 units, SSI to be changed to  moderate scale  Cough  Coughing for a few days, low grade temperature elevation 99 x1 yesterday. One point elevation in WBC 9.7->10.8.  Feels ok otherwise.   Resp panel/Covid test negative  CXR: no acute process  SLP re-eval today: no signs of aspiration   DISCHARGE EXAM Blood pressure (!) 175/72, pulse 66, temperature 98.1 F (36.7 C), temperature source Oral, resp. rate 18, height 5' 8"  (1.727 m), weight 99 kg, SpO2 98 %. GENERAL: 65year old female sitting up in bed no apparent distress.  HEENT: This is normal. RESP: No extra work of breathing CV: RRR ABDOMEN: soft, NTTP EXTREMITIES: No edema  SKIN: Normal by inspection.    MENTAL STATUS: Alert and oriented x4. Speech, language and cognition are generally intact. Judgment and insight normal.  CRANIAL NERVES: Pupils are equal, round and reactive to light and accomodation; extra ocular movements are full, there is no significant nystagmus; visual fields are full; there is flattening of the nasolabial fold on left side; tongue is midline MOTOR: Direct strength is 5/5 but there is left  hemiparesis with left upper extremity 3/5 and left lower extremity 4/5 strength with drift on both..  Significant weakness of left grip and intrinsic hand muscles.  Right side is normal. COORDINATION: There is significant dysmetria of the left upper extremity and left lower extremity. SENSATION: There is reduced sensation to temperature light touch along the left upper extremity and left leg.  Discharge Diet   Carb modified fluid consistency   DISCHARGE PLAN  [facility]:  [facility]  aspirin 81 mg daily and Plavix 75 mg daily.  Per cardiology, Dr. CGasper Sells continue DAPT until cardiac surgery. Plavix will be held in the future pending further planning closer to cardiac surgery.   Recommend ongoing stroke risk factor control by Primary Care Physician at time of discharge from inpatient rehabilitation.  Follow-up PCP BMar Daring PA-C in 2 weeks following discharge from rehab.  [facility] weeks following  discharge from rehab, office to schedule an appointment.   56 minutes were spent preparing discharge.  Lissy Olivencia-Simmons, ACNP-BC  ATTENDING NOTE: I reviewed above note and agree with the assessment and plan. Pt was seen and examined.   Patient neuro stable, no acute event overnight.  COVID test negative.  Stable for CIR discharge.  Continue DAPT until cardiac surgery per cardiology.  Continue statin.  Follow-up with GI in 4 weeks after discharge.  For detailed assessment and plan, please refer to above as I have made changes wherever appropriate.   Rosalin Hawking, MD PhD Stroke Neurology 04/29/2021 5:18 PM

## 2021-04-29 NOTE — Progress Notes (Signed)
Inpatient Rehabilitation Medication Review by a Pharmacist  A complete drug regimen review was completed for this patient to identify any potential clinically significant medication issues.  Clinically significant medication issues were identified:  no  Check AMION for pharmacist assigned to patient if future medication questions/issues arise during this admission.  Pharmacist comments:   Time spent performing this drug regimen review (minutes):  15   Ashawnti Tangen 04/29/2021 5:18 PM

## 2021-04-29 NOTE — Progress Notes (Signed)
Occupational Therapy Treatment Patient Details Name: [name] MRN: [id number] DOB: June 27, 1956 Today's Date: 04/29/2021    History of present illness [name] is a 65 y.o. female presenting 5/6 due to L-sided weakness and numbness, who has a thalamic stroke on the right along with the MRA showing a possible right P1 occlusion. Pt now s/p thrombectomy on 5/7 with complete recanalization. PMHx; of poorly controlled diabetes and hypertension, hyperlipidemia, obesity, anxiety, depression.   OT comments  Delayed entry: Pt progressing well with mobility and ability to care for self. Pt limited by decreased strength, decreased ability to care for self and decreased coordination on L side. Pt currently minA +2 for sit to stand and heavy modA+2 for stand pivot transfer. Pt limited by L incoordination, but showing increased function today with hand over hand grooming. Pt would benefit from continued OT skilled services. OT following acutely.    Follow Up Recommendations  CIR    Equipment Recommendations  3 in 1 bedside commode;Wheelchair (measurements OT);Wheelchair cushion (measurements OT)    Recommendations for Other Services Rehab consult    Precautions / Restrictions Precautions Precautions: Fall Restrictions Weight Bearing Restrictions: No       Mobility Bed Mobility Overal bed mobility: Needs Assistance Bed Mobility: Rolling;Sidelying to Sit Rolling: Min assist Sidelying to sit: Min assist       General bed mobility comments: little carryover from previous session for coming to EOB, pt continues to prefer to slide LE's to EOB but is unable to sit up from that position. vc's to reposition and then pt able to roll R with min A. Min A for LE's off EOB and facilitation of wt shifting from R to L with min A    Transfers Overall transfer level: Needs assistance Equipment used: Rolling walker (2 wheeled) Transfers: Sit to/from Stand Sit to Stand: Min assist;+2  safety/equipment         General transfer comment: [employer]; LUE on RW with hand over hand assist; minA+2 for sit to stand with elevated bed    Balance Overall balance assessment: Needs assistance Sitting-balance support: Feet supported;Single extremity supported Sitting balance-Leahy Scale: Fair   Postural control: Left lateral lean Standing balance support: Bilateral upper extremity supported;During functional activity Standing balance-Leahy Scale: Poor Standing balance comment: reliant on external assist, strong L lean with trunk  but does not shift wt to L at hips               High Level Balance Comments: worked on pregait activities in standing: fwd and bkwd tapping with RLE then LLE           ADL either performed or assessed with clinical judgement   ADL Overall ADL's : Needs assistance/impaired     Grooming: Maximal assistance;Sitting Grooming Details (indicate cue type and reason): hand over hand technique                             Functional mobility during ADLs: Moderate assistance;+2 for physical assistance;+2 for safety/equipment;Rolling walker;Cueing for safety General ADL Comments: pt limited by decreased strength, decreased ability to care for self and decreased coordination on L side.     Vision   Vision Assessment?: No apparent visual deficits   Perception     Praxis      Cognition Arousal/Alertness: Awake/alert Behavior During Therapy: WFL for tasks assessed/performed;Impulsive Overall Cognitive Status: Impaired/Different from baseline Area of Impairment: Attention;Following commands;Safety/judgement;Awareness;Problem solving;Memory  Current Attention Level: Sustained Memory: Decreased short-term memory Following Commands: Follows one step commands consistently Safety/Judgement: Decreased awareness of safety;Decreased awareness of deficits Awareness: Emergent Problem Solving: Difficulty  sequencing;Requires verbal cues;Requires tactile cues General Comments: Pt requires step by step for tasks        Exercises     Shoulder Instructions       General Comments      Pertinent Vitals/ Pain       Pain Assessment: Faces Faces Pain Scale: Hurts a little bit Pain Location: abdominal discomfort Pain Descriptors / Indicators: Discomfort  Home Living                                          Prior Functioning/Environment              Frequency  Min 2X/week        Progress Toward Goals  OT Goals(current goals can now be found in the care plan section)  Progress towards OT goals: Progressing toward goals  Acute Rehab OT Goals Patient Stated Goal: return to independence OT Goal Formulation: With patient Time For Goal Achievement: 05/03/21 Potential to Achieve Goals: Good ADL Goals Pt Will Perform Eating: with min guard assist;with adaptive utensils;sitting Pt Will Perform Grooming: with min assist;sitting Pt Will Perform Lower Body Dressing: with mod assist;sitting/lateral leans;sit to/from stand Pt/caregiver will Perform Home Exercise Program: Increased strength;Increased ROM;Left upper extremity;With minimal assist;With written HEP provided Additional ADL Goal #1: Pt will increase to MinA for functional transfers with minimal cues to keep attention to LUE. Additional ADL Goal #2: Pt will attend to L UE with minimal cues x5 mins during functional task.  Plan Discharge plan remains appropriate    Co-evaluation    PT/OT/SLP Co-Evaluation/Treatment: Yes Reason for Co-Treatment: Complexity of the patient's impairments (multi-system involvement);To address functional/ADL transfers   OT goals addressed during session: ADL's and self-care      AM-PAC OT "6 Clicks" Daily Activity     Outcome Measure   Help from another person eating meals?: A Little Help from another person taking care of personal grooming?: A Lot Help from another  person toileting, which includes using toliet, bedpan, or urinal?: A Lot Help from another person bathing (including washing, rinsing, drying)?: A Lot Help from another person to put on and taking off regular upper body clothing?: A Little Help from another person to put on and taking off regular lower body clothing?: A Lot 6 Click Score: 14    End of Session Equipment Utilized During Treatment: Gait belt  OT Visit Diagnosis: Unsteadiness on feet (R26.81);Muscle weakness (generalized) (M62.81)   Activity Tolerance Patient tolerated treatment well   Patient Left in chair;with call bell/phone within reach;with chair alarm set   Nurse Communication Mobility status        Time: 3086-5784 OT Time Calculation (min): 34 min  Charges: OT General Charges $OT Visit: 1 Visit OT Treatments $Neuromuscular Re-education: 8-22 mins  Jefferey Pica, OTR/L [place] Pager: [phone] Office: [phone]    [name] C 04/29/2021, 5:50 PM

## 2021-04-29 NOTE — Progress Notes (Addendum)
PMR Admission Coordinator Pre-Admission Assessment   Patient: [name] is an 65 y.o., female MRN: [id number] DOB: [birth year] Height: 5' 8"  (172.7 cm) Weight: 99 kg                                                                                                                                                  Insurance Information HMO:     PPO: yes     PCP:      IPA:      80/20:      OTHER:  PRIMARYIval Bible      Policy#: [id number]      Subscriber: pt CM Name: [name]      Phone#: [phone]     Fax#: [phone] Pre-Cert#: [id number] auth for CIR via fax approval with updates due to fax listed above on 5/20. F/U case manager is Clinton Quant [phone] ext [phone])   Employer: [employer] Benefits:  Phone #:      Name:  Eff. Date: 12/14/16     Deduct: $300 ($0 met)      Out of Pocket Max: $7900 ($487.06)      Life Max: n/a  CIR: 60%      SNF:      60% Outpatient: 60%     Co-Ins: 40% Home Health: 60%      Co-Ins: 40% DME: 60%     Co-Ins: 40% Providers: preferred network SECONDARY: Medicare Part A     Policy#: [id number]      Phone#:    Development worker, community:       Phone#:    The "Data Collection Information Summary" for patients in Inpatient Rehabilitation Facilities with attached "Privacy Act [place] Records" was provided and verbally reviewed with: N/A   Emergency Contact Information         Contact Information     Name Relation Home Work Mobile    [place] L Daughter [phone]        Tressia Danas [phone]           Current Medical History  Patient Admitting Diagnosis: R thalamic infarct History of Present Illness: [name] is a 65 year old right-handed female with history of uncontrolled diabetes mellitus, hypertension, hyperlipidemia, remote tobacco abuse, obesity with BMI 33.45, anxiety/depression.   Presented 04/19/2021 with acute onset of left-sided weakness and poor balance as well as dizziness.  CT/MRI as well as MRA  showed small acute infarct of the anterior lateral right thalamus adjacent to the posterior limb of the right internal capsule.  Right PCA and P1 segment occlusion.  Patient did receive tPA.  Admission chemistries unremarkable except sodium 133 chloride 96 glucose 272 BUN 25 creatinine 1.09 hemoglobin A1c 9.3.  Patient did undergo thrombectomy 04/19/2021 with complete recanalization per interventional radiology.  Echocardiogram with ejection fraction of 70 to 75% no wall motion abnormalities grade  1 diastolic dysfunction.  Currently maintained on aspirin 81 mg daily and Plavix 75 mg daily for CVA prophylaxis.  Tolerating a regular diet.  Due to patient's left-sided weakness and decreased functional mobility she was recommended for a comprehensive rehab program.   Complete NIHSS TOTAL: 7 Glasgow Coma Scale Score: 15   Past Medical History      Past Medical History:  Diagnosis Date  . Anxiety    . Arthritis    . Cataract      left  . Depression    . Diabetes mellitus without complication ([place])    . Environmental and seasonal allergies    . GERD (gastroesophageal reflux disease)    . Hyperlipidemia    . Hypertension    . Joint pain      as reported by patient  . Urinary incontinence      as stated by patient      Family History  family history includes Alzheimer's disease in her paternal grandmother; Aneurysm in her mother; Bone cancer in her brother; CAD in her father; Cancer in her father; Colon cancer in her father; Dementia in her paternal grandmother; Diabetes in her father; Healthy in her daughter and son; Heart failure in her father; Mental illness in her paternal grandfather.   Prior Rehab/Hospitalizations:  Has the patient had prior rehab or hospitalizations prior to admission? No   Has the patient had major surgery during 100 days prior to admission? No   Current Medications    Current Facility-Administered Medications:  .  0.9 %  sodium chloride infusion, , Intravenous,  Continuous, Rosalin Hawking, MD, Stopped at 04/24/21 2341 .  acetaminophen (TYLENOL) tablet 650 mg, 650 mg, Oral, Q4H PRN, 650 mg at 04/27/21 2119 **OR** acetaminophen (TYLENOL) 160 MG/5ML solution 650 mg, 650 mg, Per Tube, Q4H PRN **OR** acetaminophen (TYLENOL) suppository 650 mg, 650 mg, Rectal, Q4H PRN, Amie Portland, MD .  aspirin chewable tablet 81 mg, 81 mg, Oral, Daily, [place], Kofi, MD, 81 mg at 04/28/21 0802 .  atorvastatin (LIPITOR) tablet 40 mg, 40 mg, Oral, Daily, Rinehuls, David L, PA-C, 40 mg at 04/28/21 0800 .  clopidogrel (PLAVIX) tablet 75 mg, 75 mg, Oral, Q breakfast, [place], Kofi, MD, 75 mg at 04/28/21 0759 .  guaiFENesin (ROBITUSSIN) 100 MG/5ML solution 100 mg, 5 mL, Oral, Q4H PRN, Bailey-Modzik, Delila A, NP, 100 mg at 04/28/21 0420 .  insulin aspart (novoLOG) injection 0-15 Units, 0-15 Units, Subcutaneous, TID WC, Bailey-Modzik, Delila A, NP, 2 Units at 04/28/21 0650 .  insulin aspart (novoLOG) injection 0-5 Units, 0-5 Units, Subcutaneous, QHS, Bailey-Modzik, Delila A, NP, 3 Units at 04/25/21 2300 .  insulin aspart (novoLOG) injection 5 Units, 5 Units, Subcutaneous, TID WC, Bailey-Modzik, Delila A, NP, 5 Units at 04/28/21 0800 .  insulin detemir (LEVEMIR) injection 12 Units, 12 Units, Subcutaneous, BID, Bailey-Modzik, Delila A, NP, 12 Units at 04/28/21 0800 .  labetalol (NORMODYNE) injection 10 mg, 10 mg, Intravenous, Q2H PRN, Merlene Laughter, Kofi, MD, 10 mg at 04/23/21 2344 .  labetalol (NORMODYNE) injection 20 mg, 20 mg, Intravenous, Once **AND** [DISCONTINUED] clevidipine (CLEVIPREX) infusion 0.5 mg/mL, 0-21 mg/hr, Intravenous, Continuous, Amie Portland, MD, Last Rate: 16 mL/hr at 04/19/21 0255, 8 mg/hr at 04/19/21 0255 .  losartan (COZAAR) tablet 25 mg, 25 mg, Oral, BID, Bailey-Modzik, Delila A, NP, 25 mg at 04/28/21 0801 .  melatonin tablet 3 mg, 3 mg, Oral, QHS PRN, Greta Doom, MD, 3 mg at 04/27/21 2119 .  pantoprazole (PROTONIX) EC tablet 40 mg, 40  mg, Oral, QHS,  [place], Kofi, MD, 40 mg at 04/27/21 2119 .  senna-docusate (Senokot-S) tablet 1 tablet, 1 tablet, Oral, QHS PRN, Amie Portland, MD, 1 tablet at 04/26/21 1057 .  sodium phosphate (FLEET) 7-19 GM/118ML enema 1 enema, 1 enema, Rectal, Daily PRN, Rosalin Hawking, MD   Patients Current Diet:     Diet Order                      Diet NPO time specified  Diet effective now                      Precautions / Restrictions Precautions Precautions: Fall Restrictions Weight Bearing Restrictions: No    Has the patient had 2 or more falls or a fall with injury in the past year?No   Prior Activity Level Community (5-7x/wk): working at [employer] in [employer], driving, no DME   Prior Functional Level Prior Function Level of Independence: Independent Comments: still wokring, driving, wears readers   Self Care: Did the patient need help bathing, dressing, using the toilet or eating?  Independent   Indoor Mobility: Did the patient need assistance with walking from room to room (with or without device)? Independent   Stairs: Did the patient need assistance with internal or external stairs (with or without device)? Independent   Functional Cognition: Did the patient need help planning regular tasks such as shopping or remembering to take medications? Independent   Home Assistive Devices / Equipment Home Assistive Devices/Equipment: Dentures (specify type) (partial) Home Equipment: None   Prior Device Use: Indicate devices/aids used by the patient prior to current illness, exacerbation or injury? None of the above   Current Functional Level Cognition   Arousal/Alertness: Awake/alert Overall Cognitive Status: Impaired/Different from baseline Current Attention Level: Sustained Orientation Level: Oriented X4 Following Commands: Follows one step commands consistently Safety/Judgement: Decreased awareness of safety,Decreased awareness of deficits General Comments: patient impulsive  this session and demos decreased awareness of deficits with transfers. Cues for attending to task as patient is easily distracted Memory: Impaired (3/5 items recalled without cues, 2 with cues) Memory Impairment: Retrieval deficit Awareness: Appears intact Problem Solving: Appears intact Safety/Judgment: Appears intact Comments: Pt demonstrates good problem solving skills as she asked SLP for assist with meal orders and offered for SLP to write order to help SLP recall    Extremity Assessment (includes Sensation/Coordination)   Upper Extremity Assessment: Generalized weakness,RUE deficits/detail,LUE deficits/detail RUE Deficits / Details: 5/5 strength LUE Deficits / Details: 4/5 strength, dysmetria LUE Coordination: decreased fine motor,decreased gross motor  Lower Extremity Assessment: LLE deficits/detail LLE Deficits / Details: 5/5 strength, ataxic LLE Sensation:  (may need further assessment, pt reports intact) LLE Coordination: decreased gross motor     ADLs   Overall ADL's : Needs assistance/impaired Eating/Feeding: Supervision/ safety,Sitting Grooming: Wash/dry hands,Moderate assistance,Sitting Grooming Details (indicate cue type and reason): with LUE Upper Body Bathing: Moderate assistance,Sitting Lower Body Bathing: Maximal assistance,Sitting/lateral leans,Sit to/from stand,Bed level Upper Body Dressing : Moderate assistance,Sitting Lower Body Dressing: Maximal assistance,Sitting/lateral leans,Sit to/from stand,Cueing for safety Lower Body Dressing Details (indicate cue type and reason): assist to keep BLEs with figure 4 technique Toilet Transfer: Maximal assistance,+2 for physical assistance,+2 for safety/equipment,Stand-pivot,Cueing for safety,Cueing for sequencing Toilet Transfer Details (indicate cue type and reason): simulated to recliner Toileting- Clothing Manipulation and Hygiene: Maximal assistance,Sitting/lateral lean,Sit to/from stand,+2 for physical assistance,+2  for safety/equipment Functional mobility during ADLs: Maximal assistance,+2 for physical assistance,+2 for safety/equipment,Cueing for sequencing,Cueing for safety  General ADL Comments: pt limited by decreased strength, decreased ability to care for self and decreased coordination on L side.     Mobility   Overal bed mobility: Needs Assistance Bed Mobility: Supine to Sit Supine to sit: Mod assist Sit to supine: Mod assist General bed mobility comments: modA for trunk elevation, LE advancement, and repositioning hips at EOB. Patient sitting in soiled bed linens and pads     Transfers   Overall transfer level: Needs assistance Equipment used: 1 person hand held assist Transfers: Squat Pivot Transfers,Sit to/from Stand Sit to Stand: Mod assist Stand pivot transfers: Max assist,+2 physical assistance Squat pivot transfers: Max assist General transfer comment: modA for sit to stand x 2 for boost up and steady. Patient unable to advance R foot due to decreased weight shift to L. MaxA for squat pivot to recliner to the R. +2 would be beneficial for safety     Ambulation / Gait / Stairs / Wheelchair Mobility   Ambulation/Gait Ambulation/Gait assistance: Max assist,+2 physical assistance Gait Distance (Feet): 3 Feet Assistive device: 2 person hand held assist Gait Pattern/deviations: Step-to pattern,Decreased stride length,Decreased weight shift to left,Ataxic General Gait Details: pt with difficulty coordination of placement of LLE, increased assist to steady due to strong L lean. increased verbal cues for directioning and movement of LLE Gait velocity: decreaseed Gait velocity interpretation: <1.31 ft/sec, indicative of household ambulator     Posture / Balance Dynamic Sitting Balance Sitting balance - Comments: posterior LOB 2x in sitting when distracted and performed LE motion, vc's to correct. Balance Overall balance assessment: Needs assistance Sitting-balance support: Feet  supported,Single extremity supported Sitting balance-Leahy Scale: Fair Sitting balance - Comments: posterior LOB 2x in sitting when distracted and performed LE motion, vc's to correct. Postural control: Left lateral lean Standing balance support: Single extremity supported Standing balance-Leahy Scale: Poor Standing balance comment: modA to maintain standing     Special needs/care consideration Diabetic management yes        Previous Home Environment (from acute therapy documentation) Living Arrangements: Alone  Lives With: Other (Comment) Available Help at Discharge: Family,Available 24 hours/day Type of Home: House Home Layout: One level Home Access: Stairs to enter Entrance Stairs-Rails: Right Entrance Stairs-Number of Steps: 2-3 Bathroom Shower/Tub: Chiropodist: [place]: No   Discharge Living Setting Plans for Discharge Living Setting: Lives with (comment),Patient's home (daughters) Type of Home at Discharge: House Discharge Home Layout: One level Discharge Home Access: Stairs to enter Entrance Stairs-Rails: Right Entrance Stairs-Number of Steps: 2-3 Discharge Bathroom Shower/Tub: Tub/shower unit Discharge Bathroom Toilet: Standard Discharge Bathroom Accessibility: Yes How Accessible: Accessible via walker Does the patient have any problems obtaining your medications?: No   Social/Family/Support Systems Anticipated Caregiver: daughter, Erasmo Downer Anticipated Caregiver's Contact Information: [phone] Ability/Limitations of Caregiver: works PT, significant other can cover when she's not available Caregiver Availability: 24/7 Discharge Plan Discussed with Primary Caregiver: Yes Is Caregiver In Agreement with Plan?: Yes Does Caregiver/Family have Issues with Lodging/Transportation while Pt is in Rehab?: No     Goals Patient/Family Goal for Rehab: PT/OT/SLP supervision Expected length of stay: 18-21 days Pt/Family Agrees to  Admission and willing to participate: Yes Program Orientation Provided & Reviewed with Pt/Caregiver Including Roles  & Responsibilities: Yes  Barriers to Discharge: Insurance for SNF coverage     Decrease burden of Care through IP rehab admission: n/a   Possible need for SNF placement upon discharge: not anticipated     Patient Condition: This patient's condition remains  as documented in the consult dated 04/21/21, in which the Rehabilitation Physician determined and documented that the patient's condition is appropriate for intensive rehabilitative care in an inpatient rehabilitation facility. Will admit to inpatient rehab today.   Preadmission Screen Completed By:  Michel Santee, PT, 04/28/2021 10:31 AM ______________________________________________________________________   Discussed status with Dr. Ranell Patrick on 04/28/21  at 10:31 AM  and received approval for admission today.   Admission Coordinator:  Michel Santee, PT, DPT time 10:31 AM Sudie Grumbling 04/28/21

## 2021-04-29 NOTE — Progress Notes (Signed)
Inpatient Rehabilitation Care Coordinator Assessment and Plan Patient Details  Name: [name] MRN: [id number] Date of Birth: 27-Oct-1956  Today's Date: 04/29/2021  Hospital Problems: Active Problems:   Right thalamic infarction [facility])  Past Medical History:  Past Medical History:  Diagnosis Date  . Anxiety   . Arthritis   . Cataract    left  . Depression   . Diabetes mellitus without complication ([place])   . Environmental and seasonal allergies   . GERD (gastroesophageal reflux disease)   . Hyperlipidemia   . Hypertension   . Joint pain    as reported by patient  . Urinary incontinence    as stated by patient   Past Surgical History:  Past Surgical History:  Procedure Laterality Date  . BREAST EXCISIONAL BIOPSY Right 1999   neg  . BREAST LUMPECTOMY Right 2005   as reported by patient  . BUBBLE STUDY  04/24/2021   Procedure: BUBBLE STUDY;  Surgeon: Sueanne Margarita, MD;  Location: [place];  Service: Cardiovascular;;  . CATARACT EXTRACTION  January 2013  . CATARACT EXTRACTION W/PHACO Left 12/02/2016   Procedure: CATARACT EXTRACTION PHACO AND INTRAOCULAR LENS PLACEMENT (IOC);  Surgeon: Estill Cotta, MD;  Location: [facility] ORS;  Service: Ophthalmology;  Laterality: Left;  [place] 2:14 AP% 28.4 CDE 59.73 Fluid pack lot # [id number]  . DIAGNOSTIC LAPAROSCOPY    . IR CT HEAD LTD  04/19/2021  . IR PERCUTANEOUS ART THROMBECTOMY/INFUSION INTRACRANIAL INC DIAG ANGIO  04/19/2021      . IR PERCUTANEOUS ART THROMBECTOMY/INFUSION INTRACRANIAL INC DIAG ANGIO  04/19/2021  . IR US GUIDE VASC ACCESS LEFT  04/19/2021  . RADIOLOGY WITH ANESTHESIA N/A 04/19/2021   Procedure: IR WITH ANESTHESIA;  Surgeon: Luanne Bras, MD;  Location: [place];  Service: Radiology;  Laterality: N/A;  . TEE WITHOUT CARDIOVERSION N/A 04/24/2021   Procedure: TRANSESOPHAGEAL ECHOCARDIOGRAM (TEE);  Surgeon: Sueanne Margarita, MD;  Location[facility] ENDOSCOPY;  Service: Cardiovascular;  Laterality: N/A;  . TOTAL HIP  ARTHROPLASTY Right 04/04/2018   Procedure: TOTAL HIP ARTHROPLASTY;  Surgeon: Dereck Leep, MD;  Location: [facility] ORS;  Service: Orthopedics;  Laterality: Right;   Social History:  reports that she quit smoking about 14 years ago. Her smoking use included cigarettes. She has a 37.50 pack-year smoking history. She has never used smokeless tobacco. She reports current alcohol use. She reports that she does not use drugs.  Family / Support Systems Marital Status: Widow/Widower How Long?: 8 years ago Patient Roles: Parent,Other (Comment) (employee) Children: Lorain Childes [phone]  Michael-son [phone] Other Supports: Kristen's boyfriend Anticipated Caregiver: Kristin-daughter Ability/Limitations of Caregiver: Daughter works and they have four children who range in age from 65-8 yo Caregiver Availability: 24/7 Family Dynamics: Close with two children-stayed at daughter's home when had THR and feels will do well again. She has friends and co-workers who are supportive and will check on her  Social History Preferred language: English Religion: Non-Denominational Cultural Background: No issues Education: HS Read: Yes Write: Yes Employment Status: Employed Name of Employer: [employer] Return to Work Plans: Unsure depends upon progress Public relations account executive Issues: No issues Guardian/Conservator: none-according to MD pt is capable of making her own decisions while here. Does want to have a HCPOA completed while here-will ask for chaplain referral   Abuse/Neglect Abuse/Neglect Assessment Can Be Completed: Yes Physical Abuse: Denies Verbal Abuse: Denies Sexual Abuse: Denies Exploitation of patient/patient's resources: Denies Self-Neglect: Denies  Emotional Status Pt's affect, behavior and adjustment status: Pt is motivated to do  well and recover from her stroke, she has always been independent and wants to get back to this level beofre leaving here. She is  very stubborn and a Scientist, research (physical sciences) which she feels will benefit her while here Recent Psychosocial Issues: other health issues Psychiatric History: History of depression/anxiety does take medications for this, but will benefit from seeing neuro-psych while here for coping Substance Abuse History: Remote tobacco no other issues  Patient / Family Perceptions, Expectations & Goals Pt/Family understanding of illness & functional limitations: Pt is able to explain her stroke and deficits. She has been working on her own and talks with the MD regarding her treatment plan and questions she has. Feels her concerns are being addressed. Premorbid pt/family roles/activities: Mom, grandmother, employee, friend, co-worker, etc Anticipated changes in roles/activities/participation: resume Pt/family expectations/goals: Pt states: " I want to get back to doing for myself I am not one to ask for help, I gues I'm stubborn that way.'  [employer]: None Premorbid Home Care/DME Agencies: Other (Comment) (has rw and bsc from past THR) Transportation available at discharge: Pt did drive will rely upon her children until she is able too Resource referrals recommended: Neuropsychology  Discharge Planning Living Arrangements: Alone Support Systems: Children,Other relatives,Friends/neighbors Type of Residence: Private residence Insurance Resources: [employer] (specify) Pharmacologist) Financial Resources: Employment Financial Screen Referred: No Living Expenses: Own Money Management: Patient Does the patient have any problems obtaining your medications?: No Home Management: Self Patient/Family Preliminary Plans: Plans to go to daughter's home a block over from her's and recover until she can go back to her home. Her daughter and daughter's boyfreind will assist pt. They will try to work on 24/7 care. Care Coordinator Barriers to Discharge: Insurance for SNF coverage Care Coordinator  Anticipated Follow Up Needs: HH/OP  Clinical Impression Pleasant female who is willing to work hard and recover from this stroke. She is not one to ask for help from others and hopes she will not have too. Would benefit from seeing neuro-psych while here for coping since depression may become worse due to her CVA. Will await therapy evaluations.  [name] 04/29/2021, 3:11 PM

## 2021-04-29 NOTE — Progress Notes (Signed)
[place] Individual Statement of Services  Patient Name:  [name]  Date:  04/29/2021  Welcome to the [facility].  Our goal is to provide you with an individualized program based on your diagnosis and situation, designed to meet your specific needs.  With this comprehensive rehabilitation program, you will be expected to participate in at least 3 hours of rehabilitation therapies Monday-Friday, with modified therapy programming on the weekends.  Your rehabilitation program will include the following services:  Physical Therapy (PT), Occupational Therapy (OT), 24 hour per day rehabilitation nursing, Therapeutic Recreaction (TR), Neuropsychology, Care Coordinator, Rehabilitation Medicine, Nutrition Services and Pharmacy Services  Weekly team conferences will be held on Wednesday to discuss your progress.  Your Inpatient Rehabilitation Care Coordinator will talk with you frequently to get your input and to update you on team discussions.  Team conferences with you and your family in attendance may also be held.  Expected length of stay: 20-21 DAYS  Overall anticipated outcome:CGA-MIN ASSIST LEVEL   Depending on your progress and recovery, your program may change. Your Inpatient Rehabilitation Care Coordinator will coordinate services and will keep you informed of any changes. Your Inpatient Rehabilitation Care Coordinator's name and contact numbers are listed  below.  The following services may also be recommended but are not provided by the [facility] will be made to provide these services after discharge if needed.  Arrangements include referral to agencies that provide these services.  Your insurance has been verified to be:  UMR & Medicare Part A Your primary doctor is:  Fenton Malling  Pertinent information will be shared with your doctor and your insurance company.  Inpatient Rehabilitation Care Coordinator:  Ovidio Kin, [place] or Emilia Beck  Information discussed with and copy given to patient by: [name], 04/29/2021, 3:13 PM

## 2021-04-29 NOTE — Progress Notes (Signed)
Inpatient Rehabilitation  Patient information reviewed and entered into eRehab system by Axie Hayne M. Jezreel Sisk, M.A., CCC/SLP, PPS Coordinator.  Information including medical coding, functional ability and quality indicators will be reviewed and updated through discharge.    

## 2021-04-29 NOTE — Discharge Instructions (Addendum)
Inpatient Rehab Discharge Instructions  [name] Discharge date and time: No discharge date for patient encounter.   Activities/Precautions/ Functional Status: Activity: activity as tolerated Diet: diabetic diet Wound Care: Routine skin checks Functional status:  ___ No restrictions     ___ Walk up steps independently ___ 24/7 supervision/assistance   ___ Walk up steps with assistance ___ Intermittent supervision/assistance  ___ Bathe/dress independently ___ Walk with walker     _x__ Bathe/dress with assistance ___ Walk Independently    ___ Shower independently ___ Walk with assistance    ___ Shower with assistance ___ No alcohol     ___ Return to work/school ________  Special Instructions:  No driving smoking or alcohol  Continue aspirin 81 mg daily and Plavix 75 mg daily x3 weeks total then aspirin alone     COMMUNITY REFERRALS UPON DISCHARGE:    Home Health:   [facility] Phone: [phone]   Medical Equipment/Items Ordered:ROLLING WALKER & 3 IN 1, [facility]                                                 Agency/Supplier:[facility]  [phone]    STROKE/TIA DISCHARGE INSTRUCTIONS SMOKING Cigarette smoking nearly doubles your risk of having a stroke & is the single most alterable risk factor  If you smoke or have smoked in the last 12 months, you are advised to quit smoking for your health. Most of the excess cardiovascular risk related to smoking disappears within a year of stopping. Ask you doctor about anti-smoking medications [place] Quit Line: 1-800-QUIT NOW Free Smoking Cessation Classes [phone]  CHOLESTEROL Know your levels; limit fat & cholesterol in your diet  Lipid Panel     Component Value Date/Time   CHOL 195 04/19/2021 0427   CHOL 233 (H) 09/20/2020 1132   CHOL 205 (H) 09/06/2013 1518   TRIG 221 (H) 04/19/2021 0427   TRIG 174 09/06/2013 1518   HDL 46 04/19/2021 0427   HDL 47 09/20/2020 1132    HDL 53 09/06/2013 1518   CHOLHDL 4.2 04/19/2021 0427   VLDL 44 (H) 04/19/2021 0427   VLDL 35 09/06/2013 1518   LDLCALC 105 (H) 04/19/2021 0427   LDLCALC 152 (H) 09/20/2020 1132   LDLCALC 117 (H) 09/06/2013 1518     Many patients benefit from treatment even if their cholesterol is at goal. Goal: Total Cholesterol (CHOL) less than 160 Goal:  Triglycerides (TRIG) less than 150 Goal:  HDL greater than 40 Goal:  LDL (LDLCALC) less than 100   BLOOD PRESSURE American Stroke Association blood pressure target is less that 120/80 mm/Hg  Your discharge blood pressure is:  BP: 120/64 Monitor your blood pressure Limit your salt and alcohol intake Many individuals will require more than one medication for high blood pressure  DIABETES (A1c is a blood sugar average for last 3 months) Goal HGBA1c is under 7% (HBGA1c is blood sugar average for last 3 months)  Diabetes:     Lab Results  Component Value Date   HGBA1C 9.3 (H) 04/19/2021    Your HGBA1c can be lowered with medications, healthy diet, and exercise. Check your blood sugar as directed by your physician Call your physician if you experience unexplained or low  blood sugars.  PHYSICAL ACTIVITY/REHABILITATION Goal is 30 minutes at least 4 days per week  Activity: Increase activity slowly, Therapies: Physical Therapy: Home Health Return to work:  Activity decreases your risk of heart attack and stroke and makes your heart stronger.  It helps control your weight and blood pressure; helps you relax and can improve your mood. Participate in a regular exercise program. Talk with your doctor about the best form of exercise for you (dancing, walking, swimming, cycling).  DIET/WEIGHT Goal is to maintain a healthy weight  Your discharge diet is:  Diet Order     None       liquids Your height is:    Your current weight is:   Your Body Mass Index (BMI) is:    Following the type of diet specifically designed for you will help prevent another  stroke. Your goal weight range is:   Your goal Body Mass Index (BMI) is 19-24. Healthy food habits can help reduce 3 risk factors for stroke:  High cholesterol, hypertension, and excess weight.  RESOURCES Stroke/Support Group:  Call [phone]   STROKE EDUCATION PROVIDED/REVIEWED AND GIVEN TO PATIENT Stroke warning signs and symptoms How to activate emergency medical system (call 911). Medications prescribed at discharge. Need for follow-up after discharge. Personal risk factors for stroke. Pneumonia vaccine given:  Flu vaccine given:  My questions have been answered, the writing is legible, and I understand these instructions.  I will adhere to these goals & educational materials that have been provided to me after my discharge from the hospital.     My questions have been answered and I understand these instructions. I will adhere to these goals and the provided educational materials after my discharge from the hospital.  Patient/Caregiver Signature _______________________________ Date __________  Clinician Signature _______________________________________ Date __________  Please bring this form and your medication list with you to all your follow-up doctor's appointments.

## 2021-04-29 NOTE — TOC Transition Note (Signed)
Transition of Care [facility]) - CM/SW Discharge Note   Patient Details  Name: [name] MRN: [id number] Date of Birth: 1956-05-30  Transition of Care [facility] CM/SW Contact:  Pollie Friar, RN Phone Number: 04/29/2021, 10:27 AM   Clinical Narrative:    Patient is discharging to CIR today. No further needs per CM.   Final next level of care: IP Rehab Facility Barriers to Discharge: No Barriers Identified   Patient Goals and CMS Choice        Discharge Placement                       Discharge Plan and Services                                     Social Determinants of Health (SDOH) Interventions     Readmission Risk Interventions No flowsheet data found.

## 2021-04-30 ENCOUNTER — Other Ambulatory Visit: Payer: Self-pay

## 2021-04-30 DIAGNOSIS — I10 Essential (primary) hypertension: Secondary | ICD-10-CM

## 2021-04-30 DIAGNOSIS — K5901 Slow transit constipation: Secondary | ICD-10-CM

## 2021-04-30 DIAGNOSIS — I69354 Hemiplegia and hemiparesis following cerebral infarction affecting left non-dominant side: Principal | ICD-10-CM

## 2021-04-30 DIAGNOSIS — E871 Hypo-osmolality and hyponatremia: Secondary | ICD-10-CM

## 2021-04-30 DIAGNOSIS — D72829 Elevated white blood cell count, unspecified: Secondary | ICD-10-CM

## 2021-04-30 DIAGNOSIS — E785 Hyperlipidemia, unspecified: Secondary | ICD-10-CM

## 2021-04-30 LAB — CBC WITH DIFFERENTIAL/PLATELET
Abs Immature Granulocytes: 0.14 10*3/uL — ABNORMAL HIGH (ref 0.00–0.07)
Basophils Absolute: 0.1 10*3/uL (ref 0.0–0.1)
Basophils Relative: 1 %
Eosinophils Absolute: 0.4 10*3/uL (ref 0.0–0.5)
Eosinophils Relative: 4 %
HCT: 38.7 % (ref 36.0–46.0)
Hemoglobin: 12.7 g/dL (ref 12.0–15.0)
Immature Granulocytes: 1 %
Lymphocytes Relative: 18 %
Lymphs Abs: 2.1 10*3/uL (ref 0.7–4.0)
MCH: 28.9 pg (ref 26.0–34.0)
MCHC: 32.8 g/dL (ref 30.0–36.0)
MCV: 88 fL (ref 80.0–100.0)
Monocytes Absolute: 0.8 10*3/uL (ref 0.1–1.0)
Monocytes Relative: 7 %
Neutro Abs: 8 10*3/uL — ABNORMAL HIGH (ref 1.7–7.7)
Neutrophils Relative %: 69 %
Platelets: 310 10*3/uL (ref 150–400)
RBC: 4.4 MIL/uL (ref 3.87–5.11)
RDW: 12.9 % (ref 11.5–15.5)
WBC: 11.5 10*3/uL — ABNORMAL HIGH (ref 4.0–10.5)
nRBC: 0 % (ref 0.0–0.2)

## 2021-04-30 LAB — COMPREHENSIVE METABOLIC PANEL
ALT: 20 U/L (ref 0–44)
AST: 21 U/L (ref 15–41)
Albumin: 3.1 g/dL — ABNORMAL LOW (ref 3.5–5.0)
Alkaline Phosphatase: 56 U/L (ref 38–126)
Anion gap: 7 (ref 5–15)
BUN: 13 mg/dL (ref 8–23)
CO2: 27 mmol/L (ref 22–32)
Calcium: 8.8 mg/dL — ABNORMAL LOW (ref 8.9–10.3)
Chloride: 98 mmol/L (ref 98–111)
Creatinine, Ser: 0.95 mg/dL (ref 0.44–1.00)
GFR, Estimated: >60 mL/min (ref >60)
Glucose, Bld: 187 mg/dL — ABNORMAL HIGH (ref 70–99)
Potassium: 4.3 mmol/L (ref 3.5–5.1)
Sodium: 132 mmol/L — ABNORMAL LOW (ref 135–145)
Total Bilirubin: 0.5 mg/dL (ref 0.3–1.2)
Total Protein: 7.1 g/dL (ref 6.5–8.1)

## 2021-04-30 LAB — GLUCOSE, CAPILLARY
Glucose-Capillary: 165 mg/dL — ABNORMAL HIGH (ref 70–99)
Glucose-Capillary: 143 mg/dL — ABNORMAL HIGH (ref 70–99)
Glucose-Capillary: 245 mg/dL — ABNORMAL HIGH (ref 70–99)

## 2021-04-30 NOTE — Evaluation (Signed)
Physical Therapy Assessment and Plan  Patient Details  Name: [name] MRN: [id number] Date of Birth: Sep 22, 1956  PT Diagnosis: Abnormality of gait, Cognitive deficits, Difficulty walking, Edema, Hemiparesis non-dominant, Impaired cognition, Impaired sensation, Low back pain and Muscle weakness Rehab Potential: Good ELOS: 3 weeks   Today's Date: 04/30/2021 PT Individual Time: 8242-3536 PT Individual Time Calculation (min): 60 min    Hospital Problem: Principal Problem:   Right thalamic infarction [facility]) Active Problems:   Leukocytosis   Hyponatremia   Essential hypertension   Slow transit constipation   Hemiparesis affecting left side as late effect of stroke [facility])   Past Medical History:  Past Medical History:  Diagnosis Date  . Anxiety   . Arthritis   . Cataract    left  . Depression   . Diabetes mellitus without complication ([place])   . Environmental and seasonal allergies   . GERD (gastroesophageal reflux disease)   . Hyperlipidemia   . Hypertension   . Joint pain    as reported by patient  . Urinary incontinence    as stated by patient   Past Surgical History:  Past Surgical History:  Procedure Laterality Date  . BREAST EXCISIONAL BIOPSY Right 1999   neg  . BREAST LUMPECTOMY Right 2005   as reported by patient  . BUBBLE STUDY  04/24/2021   Procedure: BUBBLE STUDY;  Surgeon: Sueanne Margarita, MD;  Location: [place];  Service: Cardiovascular;;  . CATARACT EXTRACTION  January 2013  . CATARACT EXTRACTION W/PHACO Left 12/02/2016   Procedure: CATARACT EXTRACTION PHACO AND INTRAOCULAR LENS PLACEMENT (IOC);  Surgeon: Estill Cotta, MD;  Location: [facility] ORS;  Service: Ophthalmology;  Laterality: Left;  [place] 2:14 AP% 28.4 CDE 59.73 Fluid pack lot # [id number]  . DIAGNOSTIC LAPAROSCOPY    . IR CT HEAD LTD  04/19/2021  . IR PERCUTANEOUS ART THROMBECTOMY/INFUSION INTRACRANIAL INC DIAG ANGIO  04/19/2021      . IR PERCUTANEOUS ART THROMBECTOMY/INFUSION INTRACRANIAL  INC DIAG ANGIO  04/19/2021  . IR US GUIDE VASC ACCESS LEFT  04/19/2021  . RADIOLOGY WITH ANESTHESIA N/A 04/19/2021   Procedure: IR WITH ANESTHESIA;  Surgeon: Luanne Bras, MD;  Location: [place];  Service: Radiology;  Laterality: N/A;  . TEE WITHOUT CARDIOVERSION N/A 04/24/2021   Procedure: TRANSESOPHAGEAL ECHOCARDIOGRAM (TEE);  Surgeon: Sueanne Margarita, MD;  Location: [facility] ENDOSCOPY;  Service: Cardiovascular;  Laterality: N/A;  . TOTAL HIP ARTHROPLASTY Right 04/04/2018   Procedure: TOTAL HIP ARTHROPLASTY;  Surgeon: Dereck Leep, MD;  Location: [facility] ORS;  Service: Orthopedics;  Laterality: Right;    Assessment & Plan Clinical Impression: Patient is a 65 year old right-handed female with history of uncontrolled diabetes mellitus, hypertension, hyperlipidemia, remote tobacco abuse, obesity with BMI 33.45, anxiety/depression. Per chart review patient lives alone. Independent prior to admission and still working at [employer]. 1 level home 3 steps to entry. She plans to stay with her daughter and son-in-law on discharge. Presented 04/19/2021 with acute onset of left-sided weakness and poor balance as well as dizziness. CT/MRI as well as MRA showed small acute infarct of the anterior lateral right thalamus adjacent to the posterior limb of the right internal capsule. Right PCA and P1 segment occlusion. Patient did receive tPA. Admission chemistries unremarkable except sodium 133 chloride 96 glucose 272 BUN 25 creatinine 1.09 hemoglobin A1c 9.3. Patient did undergo thrombectomy 04/19/2021 with complete recanalization per interventional radiology. Echocardiogram with ejection fraction of 70 to 75% no wall motion abnormalities grade 1 diastolic dysfunction. Currently maintained  on aspirin 81 mg daily and Plavix 75 mg daily for CVA prophylaxis. 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 No clot./CT coronary morphologyand loop recorder placement 04/24/2021. Patient did develop a bit of a nonproductive cough chest x-ray showed no active diseaseand SARS coronavirus negative. Follow-up swallow study completed and maintained on a regular diet.Due to patient's left-sided weakness and decreased functional mobility she was admitted for a comprehensive rehab program. Patient transferred to CIR on 04/29/2021 .   Patient currently requires mod with mobility secondary to muscle weakness, decreased cardiorespiratoy endurance, impaired timing and sequencing, unbalanced muscle activation, motor apraxia, decreased coordination and decreased motor planning, decreased attention to left and decreased motor planning, decreased attention, decreased awareness, decreased problem solving and decreased safety awareness and decreased sitting balance, decreased standing balance, decreased postural control, hemiplegia and decreased balance strategies.  Prior to hospitalization, patient was independent  with mobility and lived with Alone,Other (Comment) (discharging to daughters home at [place]) in a House home.  Home access is 2-3Stairs to enter.  Patient will benefit from skilled PT intervention to maximize safe functional mobility, minimize fall risk and decrease caregiver burden for planned discharge home with 24 hour supervision and assist.  Anticipate patient will benefit from follow up [facility] at discharge.  PT - End of Session Activity Tolerance: Tolerates 30+ min activity with multiple rests Endurance Deficit: Yes Endurance Deficit Description: Pt needing several sitting rest breaks b/w functional mobility tasks PT Assessment Rehab Potential (ACUTE/IP ONLY): Good PT Barriers to Discharge: Decreased caregiver support;Home environment access/layout;Lack of/limited family support;Insurance for SNF coverage PT Patient demonstrates impairments in the following area(s):  Balance;Edema;Behavior;Endurance;Motor;Safety;Perception;Sensory;Skin Integrity;Pain PT Transfers Functional Problem(s): Bed Mobility;Car;Bed to Chair PT Locomotion Functional Problem(s): Ambulation;Stairs PT Plan PT Intensity: Minimum of 1-2 x/day ,45 to 90 minutes PT Frequency: 5 out of 7 days PT Duration Estimated Length of Stay: 3 weeks PT Treatment/Interventions: Ambulation/gait training;Balance/vestibular training;Cognitive remediation/compensation;Community reintegration;Discharge planning;Disease management/prevention;DME/adaptive equipment instruction;Functional mobility training;Neuromuscular re-education;Pain management;Patient/family education;Psychosocial support;Skin care/wound management;Splinting/orthotics;Stair training;Therapeutic Activities;Therapeutic Exercise;UE/LE Strength taining/ROM;UE/LE Coordination activities;Visual/perceptual remediation/compensation;Wheelchair propulsion/positioning PT Transfers Anticipated Outcome(s): minA with LRAD PT Locomotion Anticipated Outcome(s): minA with LRAD PT Recommendation Recommendations for Other Services: Neuropsych consult Follow Up Recommendations: 24 hour supervision/assistance;Home health PT Patient destination: Home Equipment Recommended: To be determined   PT Evaluation Precautions/Restrictions Precautions Precautions: Fall Precaution Comments: poor L side sensory awareness Restrictions Weight Bearing Restrictions: No General Chart Reviewed: Yes Additional Pertinent History: history of uncontrolled diabetes mellitus, hypertension, hyperlipidemia, remote tobacco abuse, obesity with BMI 33.45, anxiety/depression PT Amount of Missed Time (min): 15 Minutes PT Missed Treatment Reason: Patient fatigue Family/Caregiver Present: Yes (Brother)  Home Living/Prior Functioning Home Living Available Help at Discharge: Family;Available 24 hours/day Type of Home: House Home Access: Stairs to enter [employer] of  Steps: 2-3 Entrance Stairs-Rails: Right Home Layout: One level Bathroom Shower/Tub: Chiropodist: Standard  Lives With: Alone;Other (Comment) (discharging to daughters home at [place]) Prior Function Level of Independence: Independent with basic ADLs;Independent with homemaking with ambulation;Independent with gait;Independent with transfers  Able to Take Stairs?: Yes Driving: Yes Vocation: Full time employment Vocation Requirements: environmental services at [employer] Comments: still wokring, driving, wears readers Vision/Perception  Vision - Assessment Eye Alignment: Within Functional Limits Ocular Range of Motion: Within Functional Limits Alignment/Gaze Preference: Gaze right Tracking/Visual Pursuits: Able to track stimulus in all quads without difficulty Convergence: Within functional limits Additional Comments: pt has no  L visual field loss, but holds a R gaze preference Perception Perception: Impaired Inattention/Neglect: Does not attend to left side of body Praxis Praxis: Impaired Praxis Impairment Details: Motor planning  Cognition Overall Cognitive Status: Impaired/Different from baseline Arousal/Alertness: Awake/alert (fatigued) Orientation Level: Oriented X4 Memory: Appears intact Immediate Memory Recall: Sock;Blue;Bed Memory Recall Sock: Without Cue Memory Recall Blue: Without Cue Memory Recall Bed: Without Cue Awareness: Impaired Awareness Impairment: Intellectual impairment;Emergent impairment Problem Solving: Impaired Problem Solving Impairment: Verbal basic;Functional basic Behaviors: Impulsive (mild) Safety/Judgment: Impaired Comments: decreased awareness and safety with mobility Sensation Sensation Light Touch: Appears Intact Hot/Cold: Appears Intact Proprioception: Impaired by gross assessment Stereognosis: Impaired by gross assessment Additional Comments: severely impaired kinesthesia and proprioception Coordination Gross Motor Movements  are Fluid and Coordinated: No Fine Motor Movements are Fluid and Coordinated: No Coordination and Movement Description: due to sensory loss, unable to use LUE in a functional manner Finger Nose Finger Test: unable to coordinate LUE Heel Shin Test: Limited ability due to hip flexor weakness Motor  Motor Motor: Hemiplegia   Trunk/Postural Assessment  Cervical Assessment Cervical Assessment: Within Functional Limits Thoracic Assessment Thoracic Assessment: Within Functional Limits Lumbar Assessment Lumbar Assessment: Within Functional Limits Postural Control Postural Control: Deficits on evaluation Trunk Control: limited due to weakness and decreased awareness  Balance Balance Balance Assessed: Yes Static Sitting Balance Static Sitting - Balance Support: Feet supported;No upper extremity supported Static Sitting - Level of Assistance: 5: Stand by assistance Dynamic Sitting Balance Dynamic Sitting - Balance Support: During functional activity;Feet supported Dynamic Sitting - Level of Assistance: 3: Mod assist Static Standing Balance Static Standing - Balance Support: No upper extremity supported Static Standing - Level of Assistance: 3: Mod assist Dynamic Standing Balance Dynamic Standing - Balance Support: Bilateral upper extremity supported;During functional activity Dynamic Standing - Level of Assistance: 1: +2 Total assist Extremity Assessment  RUE Assessment RUE Assessment: Within Functional Limits LUE Assessment Passive Range of Motion (PROM) Comments: WFL Active Range of Motion (AROM) Comments: sh flex 150 General Strength Comments: sh flex 3+/5, elbow flex 3+/5, grasp 4/5 RLE Assessment RLE Assessment: Exceptions to [facility] General Strength Comments: Grossly 4+/5 LLE Assessment LLE Assessment: Exceptions to [facility] General Strength Comments: Ankle DF 4+/5, knee ext 4-/5, knee flex 3+/5, hip flex 2+/5  Care Tool Care Tool Bed Mobility Roll left and right activity   Roll  left and right assist level: Minimal Assistance - Patient > 75%    Sit to lying activity   Sit to lying assist level: Moderate Assistance - Patient 50 - 74%    Lying to sitting edge of bed activity   Lying to sitting edge of bed assist level: Minimal Assistance - Patient > 75%     Care Tool Transfers Sit to stand transfer   Sit to stand assist level: Moderate Assistance - Patient 50 - 74%    Chair/bed transfer   Chair/bed transfer assist level: Moderate Assistance - Patient 50 - 74%     Toilet transfer   Assist Level: Dependent - Patient 0%    Car transfer   Car transfer assist level: Moderate Assistance - Patient 50 - 74%      Care Tool Locomotion Ambulation   Assist level: 2 helpers (modA with +2 assist for w/c follow) Assistive device: Parallel bars Max distance: 10  Walk 10 feet activity   Assist level: 2 helpers Assistive device: Parallel bars   Walk 50 feet with 2 turns activity Walk 50 feet with 2 turns activity did not occur:  Safety/medical concerns      Walk 150 feet activity Walk 150 feet activity did not occur: Safety/medical concerns      Walk 10 feet on uneven surfaces activity Walk 10 feet on uneven surfaces activity did not occur: Safety/medical concerns      Stairs Stair activity did not occur: Safety/medical concerns        Walk up/down 1 step activity Walk up/down 1 step or curb (drop down) activity did not occur: Safety/medical concerns     Walk up/down 4 steps activity did not occuR: Safety/medical concerns  Walk up/down 4 steps activity      Walk up/down 12 steps activity Walk up/down 12 steps activity did not occur: Safety/medical concerns      Pick up small objects from floor Pick up small object from the floor (from standing position) activity did not occur: Safety/medical concerns      Wheelchair Will patient use wheelchair at discharge?: No   Wheelchair activity did not occur: N/A      Wheel 50 feet with 2 turns activity  Wheelchair 50 feet with 2 turns activity did not occur: N/A    Wheel 150 feet activity Wheelchair 150 feet activity did not occur: N/A      Refer to Care Plan for Long Term Goals  SHORT TERM GOAL WEEK 1 PT Short Term Goal 1 (Week 1): Pt will complete bed mobility with minA PT Short Term Goal 2 (Week 1): Pt will complete bed<>chair transfers with minA PT Short Term Goal 3 (Week 1): Pt will ambulate 67f with modA and LRAD  Recommendations for other services: Neuropsych  Skilled Therapeutic Intervention Mobility Bed Mobility Bed Mobility: Rolling Right;Rolling Left;Supine to Sit;Sit to Supine Rolling Right: Minimal Assistance - Patient > 75% Rolling Left: Contact Guard/Touching assist Supine to Sit: Moderate Assistance - Patient 50-74% Sit to Supine: Minimal Assistance - Patient > 75% Transfers Transfers: Sit to Stand;Stand to Sit;Squat Pivot Transfers;Stand Pivot Transfers Sit to Stand: Moderate Assistance - Patient 50-74% Stand to Sit: Moderate Assistance - Patient 50-74% Stand Pivot Transfers: Moderate Assistance - Patient 50 - 74% Stand Pivot Transfer Details: Manual facilitation for weight bearing;Manual facilitation for placement;Manual facilitation for weight shifting;Verbal cues for gait pattern;Verbal cues for precautions/safety;Verbal cues for technique;Verbal cues for sequencing;Visual cues/gestures for sequencing;Tactile cues for placement;Tactile cues for weight shifting;Tactile cues for sequencing;Tactile cues for initiation;Tactile cues for posture Stand Pivot Transfer Details (indicate cue type and reason): L knee block Squat Pivot Transfers: Moderate Assistance - Patient 50-74% (Towards the paretic L side) Transfer (Assistive device): 1 person hand held assist Locomotion  Gait Ambulation: Yes Gait Assistance: 2 Helpers (modA overall. +2 for w/c follow for safety) Gait Distance (Feet): 10 Feet Assistive device: Parallel bars Gait Assistance Details: Tactile cues  for placement;Tactile cues for posture;Tactile cues for weight shifting;Tactile cues for sequencing;Tactile cues for initiation;Tactile cues for weight beaing;Verbal cues for technique;Verbal cues for sequencing;Visual cues/gestures for sequencing;Visual cues/gestures for precautions/safety;Verbal cues for precautions/safety;Verbal cues for gait pattern;Verbal cues for safe use of DME/AE;Manual facilitation for weight shifting;Manual facilitation for placement;Manual facilitation for weight bearing Gait Assistance Details: Hand-over-hand assist for LUE grip to // bar Gait Gait: Yes Gait Pattern: Impaired Gait Pattern: Step-to pattern;Decreased step length - right;Decreased step length - left;Decreased stance time - left;Decreased hip/knee flexion - left;Decreased dorsiflexion - left;Decreased weight shift to left;Left genu recurvatum;Trunk flexed;Lateral hip instability;Narrow base of support;Poor foot clearance - left Gait velocity: decreaseed Stairs / Additional Locomotion Stairs: No Wheelchair Mobility Wheelchair Mobility: No  Skilled Intervention: Pt greeted supine in bed, agreeable to PT evaluation. Brother at bedside. Pt A&Ox4, pleasant and cooperative throughout session. Memory appears appropriate, describes events of stroke at [facility]. She reports chronic back pain that has gotten worse since being hospitalized. Rest breaks and mobility provided for pain management. Pt presents with L hemi body weakness and impaired proprioception/sensation that impacts functional mobility. She also presents with cognitive deficits such as  L inattention, decreased safety awareness, poor insight, decreased ability to sustain attention to task, and poor problem solving. She requires minA for rolling in bed, modA for supine<>sit, modA for sit<>stand, and modA for stand<>pivot transfers. She requires modA for gait in // bars with +2 assist for w/c follow for safety. Gait required L knee block, facilitation for  lateral weight shifting, hand-over-hand assist for LUE to maintain grasp to bar. Pt reporting fatigue and missed the last 15 minutes of therapy schedule. She remained supine in bed at end of session with needs in reach and bed alarm activated.  Instructed pt in results of PT evaluation as detailed above, PT POC, rehab potential, rehab goals, and discharge recommendations. Additionally discussed CIR's policies regarding fall safety and use of chair alarm and/or quick release belt. Pt verbalized understanding and in agreement. Will update pt's family members as they become available.   Discharge Criteria: Patient will be discharged from PT if patient refuses treatment 3 consecutive times without medical reason, if treatment goals not met, if there is a change in medical status, if patient makes no progress towards goals or if patient is discharged from hospital.  The above assessment, treatment plan, treatment alternatives and goals were discussed and mutually agreed upon: by patient  [name] PT, DPT 04/30/2021, 3:42 PM

## 2021-04-30 NOTE — Evaluation (Signed)
Speech Language Pathology Assessment and Plan  Patient Details  Name: [name] MRN: [id number] Date of Birth: [birth year]  SLP Diagnosis: Cognitive Impairments  Rehab Potential: Good ELOS: 18-21 days    Today's Date: 04/30/2021 SLP Individual Time: 5462-7035 SLP Individual Time Calculation (min): 60 min   Hospital Problem: Principal Problem:   Right thalamic infarction [facility]) Active Problems:   Leukocytosis   Hyponatremia   Essential hypertension   Slow transit constipation   Hemiparesis affecting left side as late effect of stroke [facility])  Past Medical History:  Past Medical History:  Diagnosis Date  . Anxiety   . Arthritis   . Cataract    left  . Depression   . Diabetes mellitus without complication ([place])   . Environmental and seasonal allergies   . GERD (gastroesophageal reflux disease)   . Hyperlipidemia   . Hypertension   . Joint pain    as reported by patient  . Urinary incontinence    as stated by patient   Past Surgical History:  Past Surgical History:  Procedure Laterality Date  . BREAST EXCISIONAL BIOPSY Right 1999   neg  . BREAST LUMPECTOMY Right 2005   as reported by patient  . BUBBLE STUDY  04/24/2021   Procedure: BUBBLE STUDY;  Surgeon: Sueanne Margarita, MD;  Location: [place];  Service: Cardiovascular;;  . CATARACT EXTRACTION  January 2013  . CATARACT EXTRACTION W/PHACO Left 12/02/2016   Procedure: CATARACT EXTRACTION PHACO AND INTRAOCULAR LENS PLACEMENT (IOC);  Surgeon: Estill Cotta, MD;  Location: [facility] ORS;  Service: Ophthalmology;  Laterality: Left;  [place] 2:14 AP% 28.4 CDE 59.73 Fluid pack lot # [id number]  . DIAGNOSTIC LAPAROSCOPY    . IR CT HEAD LTD  04/19/2021  . IR PERCUTANEOUS ART THROMBECTOMY/INFUSION INTRACRANIAL INC DIAG ANGIO  04/19/2021      . IR PERCUTANEOUS ART THROMBECTOMY/INFUSION INTRACRANIAL INC DIAG ANGIO  04/19/2021  . IR US GUIDE VASC ACCESS LEFT  04/19/2021  . RADIOLOGY WITH ANESTHESIA N/A 04/19/2021   Procedure: IR  WITH ANESTHESIA;  Surgeon: Luanne Bras, MD;  Location: [place];  Service: Radiology;  Laterality: N/A;  . TEE WITHOUT CARDIOVERSION N/A 04/24/2021   Procedure: TRANSESOPHAGEAL ECHOCARDIOGRAM (TEE);  Surgeon: Sueanne Margarita, MD;  Location: [facility] ENDOSCOPY;  Service: Cardiovascular;  Laterality: N/A;  . TOTAL HIP ARTHROPLASTY Right 04/04/2018   Procedure: TOTAL HIP ARTHROPLASTY;  Surgeon: Dereck Leep, MD;  Location: [facility]S;  Service: Orthopedics;  Laterality: Right;    Assessment / Plan / Recommendation Clinical Impression [name] is a 65 year old right-handed female with history of uncontrolled diabetes mellitus, hypertension, hyperlipidemia, remote tobacco abuse, obesity with BMI 33.45, anxiety/depression. Per chart review patient lives alone. Independent prior to admission and still working at [employer]. 1 level home 3 steps to entry. She plans to stay with her daughter and son-in-law on discharge. Presented 04/19/2021 with acute onset of left-sided weakness and poor balance as well as dizziness. CT/MRI as well as MRA showed small acute infarct of the anterior lateral right thalamus adjacent to the posterior limb of the right internal capsule. Right PCA and P1 segment occlusion. Patient did receive tPA. Admission chemistries unremarkable except sodium 133 chloride 96 glucose 272 BUN 25 creatinine 1.09 hemoglobin A1c 9.3. Patient did undergo thrombectomy 04/19/2021 with complete recanalization per interventional radiology. Echocardiogram with ejection fraction of 70 to 75% no wall motion abnormalities grade 1 diastolic dysfunction. Currently maintained on aspirin 81 mg daily and Plavix 75 mg daily for CVA prophylaxis.  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 No clot./CT coronary morphologyand loop recorder placement 04/24/2021. Patient did develop a bit  of a nonproductive cough chest x-ray showed no active diseaseand SARS coronavirus negative. Follow-up swallow study completed and maintained on a regular diet.Due to patient's left-sided weakness and decreased functional mobility she was admitted for a comprehensive rehab program.  Pt presents with mild cognitive impairments, typical of right CVA. Pt's deficits include reduced error awareness, reduced mildly complex problem solving and reduced sustained attention with mild impulsivity. Pt demonstrated mild deficits in attention, executive function and visuospatial awareness and WFL on memory on formal cognitive linguistic assessment CLQT. Pt reported no changes in speech nor swallow function. Pt demonstrates continuous coughing during the assessment, however recent chest x-ray and MBS on 5/16 indicate no abnormalities. Pt would benefit from skilled ST services in order to maximize functional independence and reduce burden of care in cognitive skills, with supervision at discharge with continued skilled ST services.   Skilled Therapeutic Interventions          Skilled ST services focused on cognitive skills. SLP facilitated administration of cognitive linguistic formal assessment and provided education of results. SLP and pt collaborated to set goals for cognitive linguistic needs during length of stay. All questions answered to satisfaction.  Pt was left in room with call bell within reach and bed alarm set. SLP recommends to continue skilled services.   SLP Assessment  Patient will need skilled [facility] during CIR admission    Recommendations  Patient destination: Home Follow up Recommendations: Home Health SLP;Outpatient SLP;24 hour supervision/assistance Equipment Recommended: None recommended by SLP    SLP Frequency 3 to 5 out of 7 days   SLP Duration  SLP Intensity  SLP Treatment/Interventions 18-21 days  Minumum of 1-2 x/day, 30 to 90 minutes  Cognitive  remediation/compensation;Cueing hierarchy;Functional tasks;Medication managment;Internal/external aids;Environmental controls;Patient/family education    Pain Pain Assessment Pain Scale: 0-10 Pain Score: 0-No pain Pain Type: Chronic pain Pain Location: Back Pain Orientation: Lower Pain Descriptors / Indicators: Aching;Discomfort Pain Frequency: Intermittent Pain Onset: With Activity Pain Intervention(s): Medication (See eMAR)  Prior Functioning Cognitive/Linguistic Baseline: Information not available Type of Home: House  Lives With: Alone;Other (Comment) Available Help at Discharge: Family;Available 24 hours/day Education: GED Vocation: Full time employment  SLP Evaluation Cognition Overall Cognitive Status: Impaired/Different from baseline Arousal/Alertness: Awake/alert (began becoming lethgaric after 45 minutes) Orientation Level: Oriented X4 Attention: Sustained Sustained Attention: Impaired Sustained Attention Impairment: Verbal complex;Functional complex Memory: Appears intact Awareness: Impaired Awareness Impairment: Intellectual impairment;Emergent impairment Problem Solving: Impaired Problem Solving Impairment: Verbal complex;Functional complex Behaviors: Impulsive Safety/Judgment: Impaired Comments: decreased awareness and safety with mobility  Comprehension Auditory Comprehension Overall Auditory Comprehension: Appears within functional limits for tasks assessed Expression Expression Primary Mode of Expression: Verbal Verbal Expression Overall Verbal Expression: Appears within functional limits for tasks assessed Oral Motor Oral Motor/Sensory Function Overall Oral Motor/Sensory Function: Within functional limits Motor Speech Overall Motor Speech: Appears within functional limits for tasks assessed  Care Tool Care Tool Cognition Expression of Ideas and Wants Expression of Ideas and Wants: Without difficulty (complex and basic) - expresses complex  messages without difficulty and with speech that is clear and easy to understand   Understanding Verbal and Non-Verbal Content Understanding Verbal and Non-Verbal Content: Usually understands - understands most conversations, but misses some part/intent of message. Requires cues at times to understand  Memory/Recall Ability *first 3 days only         Short Term Goals: Week 1: SLP Short Term Goal 1 (Week 1): Pt will demonstrate sustained attention to functional task for 45 minutes with supervision A verbal cues. SLP Short Term Goal 2 (Week 1): Pt will demonstrate mildly complex problem solving skills with supervision A verbal cues. SLP Short Term Goal 3 (Week 1): Pt will self-monitor and self-correct functional errors with min A verbal cues in problem solving tasks.  Refer to Care Plan for Long Term Goals  Recommendations for other services: None   Discharge Criteria: Patient will be discharged from SLP if patient refuses treatment 3 consecutive times without medical reason, if treatment goals not met, if there is a change in medical status, if patient makes no progress towards goals or if patient is discharged from hospital.  The above assessment, treatment plan, treatment alternatives and goals were discussed and mutually agreed upon: by patient  Mattalynn Crandle  [facility] 04/30/2021, 4:33 PM
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
## 2021-04-30 NOTE — Progress Notes (Signed)
[place] PHYSICAL MEDICINE & REHABILITATION PROGRESS NOTE  Subjective/Complaints: Patient seen laying in bed this morning.  Patient slept well overnight.  She states she is ready for therapy today.  ROS: Denies CP, SOB, N/V/D  Objective: Vital Signs: Blood pressure (!) 150/75, pulse 77, temperature 98 F (36.7 C), temperature source Oral, resp. rate 18, SpO2 100 %. DG CHEST PORT 1 VIEW  Result Date: 04/28/2021 CLINICAL DATA:  Cough. EXAM: PORTABLE CHEST 1 VIEW COMPARISON:  02/25/2016 FINDINGS: The lungs are clear without focal pneumonia, edema, pneumothorax or pleural effusion. The cardiopericardial silhouette is within normal limits for size. The visualized bony structures of the thorax show no acute abnormality. IMPRESSION: No active disease. Electronically Signed   By: Misty Stanley M.D.   On: 04/28/2021 12:08   DG Swallowing Func-Speech Pathology  Result Date: 04/28/2021 Objective Swallowing Evaluation: Type of Study: MBS-Modified Barium Swallow Study  Patient Details Name: [name] MRN: [id number] Date of Birth: December 01, 1956 Today's Date: 04/28/2021 Time: SLP Start Time (ACUTE ONLY): 1415 -SLP Stop Time (ACUTE ONLY): 1428 SLP Time Calculation (min) (ACUTE ONLY): 13 min Past Medical History: Past Medical History: Diagnosis Date . Anxiety  . Arthritis  . Cataract   left . Depression  . Diabetes mellitus without complication ([place])  . Environmental and seasonal allergies  . GERD (gastroesophageal reflux disease)  . Hyperlipidemia  . Hypertension  . Joint pain   as reported by patient . Urinary incontinence   as stated by patient Past Surgical History: Past Surgical History: Procedure Laterality Date . BREAST EXCISIONAL BIOPSY Right 1999  neg . BREAST LUMPECTOMY Right 2005  as reported by patient . BUBBLE STUDY  04/24/2021  Procedure: BUBBLE STUDY;  Surgeon: Sueanne Margarita, MD;  Location: [place];  Service: Cardiovascular;; . CATARACT EXTRACTION  January 2013 . CATARACT EXTRACTION W/PHACO  Left 12/02/2016  Procedure: CATARACT EXTRACTION PHACO AND INTRAOCULAR LENS PLACEMENT (IOC);  Surgeon: Estill Cotta, MD;  Location: [facility];  Service: Ophthalmology;  Laterality: Left;  [place] 2:14 AP% 28.4 CDE 59.73 Fluid pack lot # [id number] . DIAGNOSTIC LAPAROSCOPY   . IR CT HEAD LTD  04/19/2021 . IR PERCUTANEOUS ART THROMBECTOMY/INFUSION INTRACRANIAL INC DIAG ANGIO  04/19/2021    . IR PERCUTANEOUS ART THROMBECTOMY/INFUSION INTRACRANIAL INC DIAG ANGIO  04/19/2021 . IR US GUIDE VASC ACCESS LEFT  04/19/2021 . RADIOLOGY WITH ANESTHESIA N/A 04/19/2021  Procedure: IR WITH ANESTHESIA;  Surgeon: Luanne Bras, MD;  Location: [place];  Service: Radiology;  Laterality: N/A; . TEE WITHOUT CARDIOVERSION N/A 04/24/2021  Procedure: TRANSESOPHAGEAL ECHOCARDIOGRAM (TEE);  Surgeon: Sueanne Margarita, MD;  Location: [facility] ENDOSCOPY;  Service: Cardiovascular;  Laterality: N/A; . TOTAL HIP ARTHROPLASTY Right 04/04/2018  Procedure: TOTAL HIP ARTHROPLASTY;  Surgeon: Dereck Leep, MD;  Location: [facility];  Service: Orthopedics;  Laterality: Right; HPI: [name] is a 65 y.o. female with history of poorly controlled diabetes and hypertension, hyperlipidemia, obesity, anxiety, depression, presenting with acute dizziness left-sided weakness, left-sided numbness, and possible visual deficits. She received IV tPA  via telemedicine at [place] hrs. 04/18/2021 and was found to acute nonhemorrhagic lateral aspect of the right thalamus. Acute nonhemorrhagic infarcts involving the more superior and medial right thalamus, the right occipital lobe, the left side of the splenium of the corpus callosum, and the right cerebellum - embolic - etiology unknown. Initially passed Yale on 5/7, but has been coughing often, and repeat Yale administed on 5/16 which she failed due to coughing.  Subjective: pt awake in bed Assessment /  Plan / Recommendation CHL IP CLINICAL IMPRESSIONS 04/28/2021 Clinical Impression  Pt demonstrates no aspiration or dysphagia though  she did cough somewhat often during assessment, particularly at the end. When questioned about reflux, pt both denied it and told a story about having bad heartburn with certain foods. Esophageal sweep unrevealing. Reported findings to NP. Pt may continue a regular diet and thin liquids. SLP Visit Diagnosis Dysphagia, unspecified (R13.10) Attention and concentration deficit following -- Frontal lobe and executive function deficit following -- Impact on safety and function Mild aspiration risk   CHL IP TREATMENT RECOMMENDATION 04/28/2021 Treatment Recommendations No treatment recommended at this time   No flowsheet data found. CHL IP DIET RECOMMENDATION 04/28/2021 SLP Diet Recommendations Regular solids;Thin liquid Liquid Administration via Cup;Straw Medication Administration Whole meds with liquid Compensations Slow rate;Small sips/bites Postural Changes Remain semi-upright after after feeds/meals (Comment);Seated upright at 90 degrees   CHL IP OTHER RECOMMENDATIONS 04/28/2021 Recommended Consults -- Oral Care Recommendations Oral care BID Other Recommendations --   CHL IP FOLLOW UP RECOMMENDATIONS 04/28/2021 Follow up Recommendations Inpatient Rehab   No flowsheet data found.     CHL IP ORAL PHASE 04/28/2021 Oral Phase WFL Oral - Pudding Teaspoon -- Oral - Pudding Cup -- Oral - Honey Teaspoon -- Oral - Honey Cup -- Oral - Nectar Teaspoon -- Oral - Nectar Cup -- Oral - Nectar Straw -- Oral - Thin Teaspoon -- Oral - Thin Cup -- Oral - Thin Straw -- Oral - Puree -- Oral - Mech Soft -- Oral - Regular -- Oral - Multi-Consistency -- Oral - Pill -- Oral Phase - Comment --  CHL IP PHARYNGEAL PHASE 04/28/2021 Pharyngeal Phase WFL Pharyngeal- Pudding Teaspoon -- Pharyngeal -- Pharyngeal- Pudding Cup -- Pharyngeal -- Pharyngeal- Honey Teaspoon -- Pharyngeal -- Pharyngeal- Honey Cup -- Pharyngeal -- Pharyngeal- Nectar Teaspoon -- Pharyngeal -- Pharyngeal- Nectar Cup -- Pharyngeal -- Pharyngeal- Nectar Straw -- Pharyngeal --  Pharyngeal- Thin Teaspoon -- Pharyngeal -- Pharyngeal- Thin Cup -- Pharyngeal -- Pharyngeal- Thin Straw -- Pharyngeal -- Pharyngeal- Puree -- Pharyngeal -- Pharyngeal- Mechanical Soft -- Pharyngeal -- Pharyngeal- Regular -- Pharyngeal -- Pharyngeal- Multi-consistency -- Pharyngeal -- Pharyngeal- Pill -- Pharyngeal -- Pharyngeal Comment --  No flowsheet data found. DeBlois, Katherene Ponto 04/28/2021, 2:42 PM              Recent Labs    04/29/21 0415 04/30/21 0537  WBC 11.9* 11.5*  HGB 12.1 12.7  HCT 36.1 38.7  PLT 308 310   Recent Labs    04/29/21 0415 04/30/21 0537  NA 134* 132*  K 4.0 4.3  CL 97* 98  CO2 27 27  GLUCOSE 174* 187*  BUN 13 13  CREATININE 0.90 0.95  CALCIUM 8.8* 8.8*    Intake/Output Summary (Last 24 hours) at 04/30/2021 1022 Last data filed at 04/30/2021 0440 Gross per 24 hour  Intake 1080 ml  Output 2300 ml  Net -1220 ml        Physical Exam: BP (!) 150/75 (BP Location: Left Arm)   Pulse 77   Temp 98 F (36.7 C) (Oral)   Resp 18   SpO2 100%  Constitutional: No distress . Vital signs reviewed.  Obese. HENT: Normocephalic.  Atraumatic. Eyes: EOMI. No discharge. Cardiovascular: No JVD.  RRR. Respiratory: Normal effort.  No stridor.  Bilateral clear to auscultation. GI: Non-distended.  BS +. Skin: Warm and dry.  Intact. Psych: Normal mood.  Normal behavior. Musc: No edema in extremities.  No tenderness in extremities. Neuro: Alert  Follows commands.  Fair insight and awareness.  Motor:LUE 3+/5 prox to distal.  LLE: 4-/5 prox to distal.   Assessment/Plan: 1. Functional deficits which require 3+ hours per day of interdisciplinary therapy in a comprehensive inpatient rehab setting.  Physiatrist is providing close team supervision and 24 hour management of active medical problems listed below.  Physiatrist and rehab team continue to assess barriers to discharge/monitor patient progress toward functional and medical goals   Care Tool:  Bathing               Bathing assist       Upper Body Dressing/Undressing Upper body dressing        Upper body assist Assist Level: Moderate Assistance - Patient 50 - 74%    Lower Body Dressing/Undressing Lower body dressing            Lower body assist Assist for lower body dressing: Maximal Assistance - Patient 25 - 49%     Toileting Toileting    Toileting assist Assist for toileting: Maximal Assistance - Patient 25 - 49% (purewick)     Transfers Chair/bed transfer  Transfers assist           Locomotion Ambulation   Ambulation assist              Walk 10 feet activity   Assist           Walk 50 feet activity   Assist           Walk 150 feet activity   Assist           Walk 10 feet on uneven surface  activity   Assist           Wheelchair     Assist               Wheelchair 50 feet with 2 turns activity    Assist            Wheelchair 150 feet activity     Assist           Medical Problem List and Plan: 1.Left side hemiparesis to right thalamic infarction. Status post mechanical thrombectomy of right PCA P2 occlusion 04/19/2021. Status post loop recorder  Begin CIR evaluations  2. Antithrombotics: -DVT/anticoagulation:SCDs -antiplatelet therapy: Aspirin 81 mg daily, Plavix 75 mg dailyx3 weeks then aspirin alone 3. Pain Management:Tylenol as needed 4. Mood:Zoloft 100 mg daily, melatonin as needed. Provide emotional support -antipsychotic agents: N/A 5. Neuropsych: This patientiscapable of making decisions on herown behalf. 6. Skin/Wound Care:Routine skin checks 7. Fluids/Electrolytes/Nutrition:Routine in and outs 8. Diabetes mellitus. Hemoglobin A1c 9.3.  Patient on Actos, Januvia 100 mg, Glucotrol XL5 mg daily,metformin 1000 mg twice daily prior to admission.   Currently onNovoLog 5units 3 times daily with meals, Levemir 12units twice  daily. Resume glucophage at 500mg  bid -Diabetic teaching  Monitor with increased mobility 9. Hypertension. Cozaar 25 mg twice daily  Monitor with increased mobility 10. Hyperlipidemia. Lipitor 11. Obesity. BMI 33.19. Dietary follow-up 12. GERD. Protonix 13. Constipation: scheduled senna-s, encourage liquids, fruits, vegetables -dulcolax suppository/fleet enema prn  Adjust bowel meds as necessary 14. Cough: COVID negative -appears to be mostly pharyngeal, upper airway -Mucinex DM 15.  Hyponatremia  Sodium 132 on 5/18  Continue to monitor 16.  Leukocytosis  WBCs 11.5 on 5/18  Afebrile  Continue to monitor  LOS: 1 days A FACE TO FACE EVALUATION WAS PERFORMED  [place] Cruzan Lorie Phenix 04/30/2021, 10:22 AM

## 2021-04-30 NOTE — Evaluation (Signed)
Occupational Therapy Assessment and Plan  Patient Details  Name: [name] MRN: [id number] Date of Birth: 07/04/1956  OT Diagnosis: apraxia, cognitive deficits, hemiplegia affecting non-dominant side and muscle weakness (generalized) Rehab Potential: Rehab Potential (ACUTE ONLY): Good ELOS: 20-21 days   Today's Date: 04/30/2021 OT Individual Time: 1000-1100 OT Individual Time Calculation (min): 60 min     Hospital Problem: Principal Problem:   Right thalamic infarction [facility]) Active Problems:   Leukocytosis   Hyponatremia   Essential hypertension   Slow transit constipation   Hemiparesis affecting left side as late effect of stroke [facility])   Past Medical History:  Past Medical History:  Diagnosis Date  . Anxiety   . Arthritis   . Cataract    left  . Depression   . Diabetes mellitus without complication ([place])   . Environmental and seasonal allergies   . GERD (gastroesophageal reflux disease)   . Hyperlipidemia   . Hypertension   . Joint pain    as reported by patient  . Urinary incontinence    as stated by patient   Past Surgical History:  Past Surgical History:  Procedure Laterality Date  . BREAST EXCISIONAL BIOPSY Right 1999   neg  . BREAST LUMPECTOMY Right 2005   as reported by patient  . BUBBLE STUDY  04/24/2021   Procedure: BUBBLE STUDY;  Surgeon: Sueanne Margarita, MD;  Location: [employer];  Service: Cardiovascular;;  . CATARACT EXTRACTION  January 2013  . CATARACT EXTRACTION W/PHACO Left 12/02/2016   Procedure: CATARACT EXTRACTION PHACO AND INTRAOCULAR LENS PLACEMENT (IOC);  Surgeon: Estill Cotta, MD;  Location: [facility] ORS;  Service: Ophthalmology;  Laterality: Left;  [place] 2:14 AP% 28.4 CDE 59.73 Fluid pack lot # [id number]  . DIAGNOSTIC LAPAROSCOPY    . IR CT HEAD LTD  04/19/2021  . IR PERCUTANEOUS ART THROMBECTOMY/INFUSION INTRACRANIAL INC DIAG ANGIO  04/19/2021      . IR PERCUTANEOUS ART THROMBECTOMY/INFUSION INTRACRANIAL INC DIAG ANGIO  04/19/2021  .  IR US GUIDE VASC ACCESS LEFT  04/19/2021  . RADIOLOGY WITH ANESTHESIA N/A 04/19/2021   Procedure: IR WITH ANESTHESIA;  Surgeon: Luanne Bras, MD;  Location: [place];  Service: Radiology;  Laterality: N/A;  . TEE WITHOUT CARDIOVERSION N/A 04/24/2021   Procedure: TRANSESOPHAGEAL ECHOCARDIOGRAM (TEE);  Surgeon: Sueanne Margarita, MD;  Location: [facility];  Service: Cardiovascular;  Laterality: N/A;  . TOTAL HIP ARTHROPLASTY Right 04/04/2018   Procedure: TOTAL HIP ARTHROPLASTY;  Surgeon: Dereck Leep, MD;  Location: [facility] ORS;  Service: Orthopedics;  Laterality: Right;    Assessment & Plan Clinical Impression:[name] is a 65 year old right-handed female with history of uncontrolled diabetes mellitus, hypertension, hyperlipidemia, remote tobacco abuse, obesity with BMI 33.45, anxiety/depression.  Per chart review patient lives alone.  Independent prior to admission and still working at [employer].  1 level home 3 steps to entry.  She plans to stay with her daughter and son-in-law on discharge.  Presented 04/19/2021 with acute onset of left-sided weakness and poor balance as well as dizziness.  CT/MRI as well as MRA showed small acute infarct of the anterior lateral right thalamus adjacent to the posterior limb of the right internal capsule.  Right PCA and P1 segment occlusion.  Patient did receive tPA.  Admission chemistries unremarkable except sodium 133 chloride 96 glucose 272 BUN 25 creatinine 1.09 hemoglobin A1c 9.3.  Patient did undergo thrombectomy 04/19/2021 with complete recanalization per interventional radiology.  Echocardiogram with ejection fraction of 70 to 75% no wall  motion abnormalities grade 1 diastolic dysfunction.  Currently maintained on aspirin 81 mg daily and Plavix 75 mg daily for CVA prophylaxis.  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  No clot./CT  coronary morphology and loop recorder placement 04/24/2021.  Patient did develop a bit of a nonproductive cough chest x-ray showed no active disease and SARS coronavirus negative.  Follow-up swallow study completed and maintained on a regular diet.   Due to patient's left-sided weakness and decreased functional mobility she was admitted for a comprehensive rehab program.   Patient transferred to CIR on 04/29/2021 .    Patient currently requires max with basic self-care skills secondary to muscle weakness, decreased cardiorespiratoy endurance, unbalanced muscle activation, motor apraxia and decreased coordination, decreased attention to left, decreased awareness, decreased problem solving, decreased safety awareness and decreased memory and decreased sitting balance, decreased standing balance, decreased postural control, hemiplegia and decreased balance strategies.  Prior to hospitalization, patient was fully independent and working.  Patient will benefit from skilled intervention to increase independence with basic self-care skills prior to discharge home with care partner.  Anticipate patient will require 24 hour supervision and minimal physical assistance and follow up home health.  OT - End of Session Activity Tolerance: Tolerates 10 - 20 min activity with multiple rests Endurance Deficit: Yes OT Assessment Rehab Potential (ACUTE ONLY): Good OT Patient demonstrates impairments in the following area(s): Balance;Endurance;Motor;Perception;Safety;Sensory;Cognition OT Basic ADL's Functional Problem(s): Bathing;Dressing;Toileting;Grooming OT Transfers Functional Problem(s): Toilet;Tub/Shower OT Additional Impairment(s): Fuctional Use of Upper Extremity OT Plan OT Intensity: Minimum of 1-2 x/day, 45 to 90 minutes OT Frequency: 5 out of 7 days OT Duration/Estimated Length of Stay: 20-21 days OT Treatment/Interventions: Balance/vestibular training;Cognitive remediation/compensation;Discharge  planning;DME/adaptive equipment instruction;Functional mobility training;Psychosocial support;Patient/family education;Neuromuscular re-education;Self Care/advanced ADL retraining;Therapeutic Activities;Visual/perceptual remediation/compensation;UE/LE Coordination activities;UE/LE Strength taining/ROM;Therapeutic Exercise OT Self Feeding Anticipated Outcome(s): independent OT Basic Self-Care Anticipated Outcome(s): min A OT Toileting Anticipated Outcome(s): min A OT Bathroom Transfers Anticipated Outcome(s): min A OT Recommendation Patient destination: Home Follow Up Recommendations: Home health OT Equipment Recommended: Tub/shower bench   OT Evaluation Precautions/Restrictions  Precautions Precaution Comments: poor L side sensory awareness  Pain Pain Assessment Pain Scale: 0-10 Pain Score: 0-No pain Pain Type: Acute pain Pain Location: Back Pain Orientation: Lower Pain Descriptors / Indicators: Aching Pain Frequency: Constant Pain Onset: Gradual Pain Intervention(s): Medication (See eMAR) Home Living/Prior Functioning Home Living Family/patient expects to be discharged to:: Private residence Living Arrangements: Alone Available Help at Discharge: Family,Available 24 hours/day Type of Home: House Home Access: Stairs to enter Technical brewer of Steps: 2-3 Entrance Stairs-Rails: Right Home Layout: One level Bathroom Shower/Tub: Chiropodist: Standard  Lives With: Other (Comment) Prior Function Level of Independence: Independent with basic ADLs,Independent with homemaking with ambulation,Independent with gait,Independent with transfers  Able to Take Stairs?: Yes Driving: Yes Vocation: Full time employment Vocation Requirements: environmental services Vision Baseline Vision/History: No visual deficits Patient Visual Report: No change from baseline Vision Assessment?: Yes Eye Alignment: Within Functional Limits Ocular Range of Motion: Within  Functional Limits Alignment/Gaze Preference: Gaze right Tracking/Visual Pursuits: Able to track stimulus in all quads without difficulty Convergence: Within functional limits Visual Fields: No apparent deficits Additional Comments: pt has no L visual field loss, but holds a R gaze preference Perception  Perception: Impaired Inattention/Neglect: Does not attend to left side of body Praxis Praxis: Impaired Praxis Impairment Details: Motor planning Cognition  Arousal/Alertness: Awake/alert (pt awake but c/o fatigue) Orientation Level: Person;Place;Situation Person: Oriented Place: Oriented Situation: Oriented Year: 2022 Month: May Day of Week: Incorrect (Tuesday) Memory: Appears intact Immediate Memory Recall: Sock;Blue;Bed Memory Recall Sock: Without Cue Memory Recall Blue: Without Cue Memory Recall Bed: Without Cue Awareness: Impaired Awareness Impairment: Intellectual impairment;Emergent impairment Problem Solving: Impaired Problem Solving Impairment: Verbal basic;Functional basic Behaviors: Impulsive (slightly impulsive) Safety/Judgment: Impaired Comments: decreased awareness and safety with mobility Sensation Sensation Light Touch: Appears Intact Hot/Cold: Appears Intact Proprioception: Impaired by gross assessment Stereognosis: Impaired by gross assessment Additional Comments: severely impaired kinesthesia and proprioception Coordination Gross Motor Movements are Fluid and Coordinated: No Fine Motor Movements are Fluid and Coordinated: No Coordination and Movement Description: due to sensory loss, unable to use LUE in a functional manner Finger Nose Finger Test: unable to coordinate LUE Motor  Motor Motor: Hemiplegia  Trunk/Postural Assessment  Postural Control Postural Control: Deficits on evaluation Trunk Control: limited due to weakness and decreased awareness  Balance Static Sitting Balance Static Sitting - Level of Assistance: 5: Stand by  assistance Dynamic Sitting Balance Dynamic Sitting - Level of Assistance: 3: Mod assist Static Standing Balance Static Standing - Level of Assistance: 3: Mod assist Dynamic Standing Balance Dynamic Standing - Level of Assistance: 1: +2 Total assist Extremity/Trunk Assessment RUE Assessment RUE Assessment: Within Functional Limits LUE Assessment Passive Range of Motion (PROM) Comments: WFL Active Range of Motion (AROM) Comments: sh flex 150 General Strength Comments: sh flex 3+/5, elbow flex 3+/5, grasp 4/5  Care Tool Care Tool Self Care Eating    set up    Oral Care    Oral Care Assist Level: Set up assist    Bathing   Body parts bathed by patient: Chest;Abdomen;Right upper leg;Left upper leg;Face;Left arm Body parts bathed by helper: Front perineal area;Buttocks;Right lower leg;Left lower leg;Right arm   Assist Level: Maximal Assistance - Patient 24 - 49%    Upper Body Dressing(including orthotics)   What is the patient wearing?: Pull over shirt;Bra   Assist Level: Moderate Assistance - Patient 50 - 74%    Lower Body Dressing (excluding footwear)   What is the patient wearing?: Incontinence brief;Pants Assist for lower body dressing: Maximal Assistance - Patient 25 - 49%    Putting on/Taking off footwear   What is the patient wearing?: Non-skid slipper socks Assist for footwear: Total Assistance - Patient < 25%       Care Tool Toileting Toileting activity   Assist for toileting: Maximal Assistance - Patient 25 - 49%     Care Tool Bed Mobility Roll left and right activity   Roll left and right assist level: Minimal Assistance - Patient > 75%    Sit to lying activity   Sit to lying assist level: Moderate Assistance - Patient 50 - 74%    Lying to sitting edge of bed activity   Lying to sitting edge of bed assist level: Minimal Assistance - Patient > 75%     Care Tool Transfers Sit to stand transfer   Sit to stand assist level: Moderate Assistance - Patient  50 - 74%    Chair/bed transfer   Chair/bed transfer assist level: Dependent - mechanical lift (stand pivot to L not safe due to impaired sensation)     Toilet transfer   Assist Level: Dependent - Patient 0%     Care Tool Cognition Expression of Ideas and Wants Expression of Ideas and Wants: Without difficulty (complex and basic) - expresses complex messages without  difficulty and with speech that is clear and easy to understand   Understanding Verbal and Non-Verbal Content Understanding Verbal and Non-Verbal Content: Usually understands - understands most conversations, but misses some part/intent of message. Requires cues at times to understand   Memory/Recall Ability *first 3 days only Memory/Recall Ability *first 3 days only: Location of own room;That he or she is in a hospital/hospital unit;Current season    Refer to Care Plan for Long Term Goals  SHORT TERM GOAL WEEK 1 OT Short Term Goal 1 (Week 1): pt will complete squat pivot transfers with mod A. OT Short Term Goal 2 (Week 1): Pt will be able to hold static stand with min A. OT Short Term Goal 3 (Week 1): Pt will don shirt with min A OT Short Term Goal 4 (Week 1): Pt will be able to don pants over feet with min A. OT Short Term Goal 5 (Week 1): Pt will be able to pull pants over hips with mod A  Recommendations for other services: None    Skilled Therapeutic Intervention ADL ADL Eating: Set up Grooming: Setup Upper Body Bathing: Minimal assistance Where Assessed-Upper Body Bathing: Sitting at sink Lower Body Bathing: Maximal assistance Where Assessed-Lower Body Bathing: Sitting at sink Upper Body Dressing: Moderate assistance Where Assessed-Upper Body Dressing: Sitting at sink Lower Body Dressing: Maximal assistance Where Assessed-Lower Body Dressing:  (used stedy to stand) Toileting: Maximal assistance (with A from stedy) Toilet Transfer: Maximal assistance Toilet Transfer Method: Other (comment) (stedy) Toilet  Transfer Equipment: Bedside commode (BSC over toilet) Mobility  Transfers Sit to Stand: Moderate Assistance - Patient 50-74% Stand to Sit: Moderate Assistance - Patient 50-74%   Pt seen for initial evaluation and ADL training with a focus on left side awareness and coordination. At this time stand pivot transfers are not safe and due to body habitus, squat pivots are difficult with her limited L side awareness.  Pt  Is safest with a stedy to transfers.  Discussed role of OT, pt's goals and ELOS.  Pt participated well and is motivated to progress. Resting in bed with all needs met and bed alarm on.    Discharge Criteria: Patient will be discharged from OT if patient refuses treatment 3 consecutive times without medical reason, if treatment goals not met, if there is a change in medical status, if patient makes no progress towards goals or if patient is discharged from hospital.  The above assessment, treatment plan, treatment alternatives and goals were discussed and mutually agreed upon: by patient  [facility] 04/30/2021, 12:32 PM

## 2021-04-30 NOTE — IPOC Note (Signed)
Individualized overall Plan of Care (IPOC) Patient Details Name: [name] MRN: [id number] DOB: [birth year]  Admitting Diagnosis: Right thalamic infarction [facility])  Hospital Problems: Principal Problem:   Right thalamic infarction [facility]) Active Problems:   Leukocytosis   Hyponatremia   Essential hypertension   Slow transit constipation   Hemiparesis affecting left side as late effect of stroke ([place])     Functional Problem List: Nursing Bowel,Bladder,Endurance,Medication Management,Pain,Safety,Skin Integrity  PT Balance,Edema,Behavior,Endurance,Motor,Safety,Perception,Sensory,Skin Integrity,Pain  OT Balance,Endurance,Motor,Perception,Safety,Sensory,Cognition  SLP Cognition  TR         Basic ADL's: OT Bathing,Dressing,Toileting,Grooming     Advanced  ADL's: OT       Transfers: PT Bed Mobility,Car,Bed to Chair  OT Toilet,Tub/Shower     Locomotion: PT Ambulation,Stairs     Additional Impairments: OT Fuctional Use of Upper Extremity  SLP Social Cognition   Problem Solving,Awareness,Attention  TR      Anticipated Outcomes Item Anticipated Outcome  Self Feeding independent  Swallowing      Basic self-care  min A  Toileting  min A   Bathroom Transfers min A  Bowel/Bladder  patient will be continent of bowel and bladder with min assist  Transfers  minA with LRAD  Locomotion  minA with LRAD  Communication     Cognition  Supervision A  Pain  pain will be equal to or less than 4/10 with min assist  Safety/Judgment  patient will be free from falls/injury and making appropriate safety decisions   Therapy Plan: PT Intensity: Minimum of 1-2 x/day ,45 to 90 minutes PT Frequency: 5 out of 7 days PT Duration Estimated Length of Stay: 3 weeks OT Intensity: Minimum of 1-2 x/day, 45 to 90 minutes OT Frequency: 5 out of 7 days OT Duration/Estimated Length of Stay: 20-21 days SLP Intensity: Minumum of 1-2 x/day, 30 to 90 minutes SLP Frequency: 3 to 5 out of  7 days SLP Duration/Estimated Length of Stay: 18-21 days    Team Interventions: Nursing Interventions Patient/Family Education,Bladder Management,Bowel Management,Disease Management/Prevention,Pain Management,Medication Management,Skin Care/Wound Higher education careers adviser Support  PT interventions Ambulation/gait training,Balance/vestibular training,Cognitive remediation/compensation,Community reintegration,Discharge planning,Disease Printmaker instruction,Functional mobility training,Neuromuscular re-education,Pain management,Patient/family education,Psychosocial support,Skin care/wound management,Splinting/orthotics,Stair training,Therapeutic Activities,Therapeutic Exercise,UE/LE Strength taining/ROM,UE/LE Coordination activities,Visual/perceptual remediation/compensation,Wheelchair propulsion/positioning  OT Interventions Balance/vestibular training,Cognitive remediation/compensation,Discharge planning,DME/adaptive equipment instruction,Functional mobility training,Psychosocial support,Patient/family education,Neuromuscular re-education,Self Care/advanced ADL retraining,Therapeutic Activities,Visual/perceptual remediation/compensation,UE/LE Coordination activities,UE/LE Strength taining/ROM,Therapeutic Exercise  SLP Interventions Cognitive remediation/compensation,Cueing hierarchy,Functional tasks,Medication managment,Internal/external aids,Environmental controls,Patient/family education  TR Interventions    SW/CM Interventions Discharge Planning,Psychosocial Support,Patient/Family Education   Barriers to Discharge MD  Medical stability and Weight  Nursing Decreased caregiver support,Home environment access/layout 1 level 3 ste with dtr and son - in - law  PT Decreased caregiver support,Home environment access/layout,Lack of/limited family support,Insurance for SNF coverage    OT      SLP      SW Insurance for SNF coverage     Team  Discharge Planning: Destination: PT-Home ,OT- Home , SLP-Home Projected Follow-up: PT-24 hour supervision/assistance,Home health PT, OT-  Home health OT, SLP-Home Health SLP,Outpatient SLP,24 hour supervision/assistance Projected Equipment Needs: PT-To be determined, OT- Tub/shower bench, SLP-None recommended by SLP Equipment Details: PT- , OT-  Patient/family involved in discharge planning: PT- Patient,  OT-Patient, SLP-Patient  MD ELOS: 17-20 days. Medical Rehab Prognosis:  Good Assessment: 65 year old right-handed female with history of uncontrolled diabetes mellitus, hypertension, hyperlipidemia, remote tobacco abuse, obesity with BMI 33.45, anxiety/depression. Per chart review patient lives alone.  Independent prior to admission and still working at [employer].  1  level home 3 steps to entry.  She plans to stay with her daughter and son-in-law on discharge.  Presented 04/19/2021 with acute onset of left-sided weakness and poor balance as well as dizziness.  CT/MRI as well as MRA showed small acute infarct of the anterior lateral right thalamus adjacent to the posterior limb of the right internal capsule. Right PCA and P1 segment occlusion.  Patient did receive tPA.  Admission chemistries unremarkable except sodium 133 chloride 96 glucose 272 BUN 25 creatinine 1.09 hemoglobin A1c 9.3.  Patient did undergo thrombectomy 04/19/2021 with complete recanalization per interventional radiology.  Echocardiogram with ejection fraction of 70 to 75% no wall motion abnormalities grade 1 diastolic dysfunction.  Currently maintained on aspirin 81 mg daily and Plavix 75 mg daily for CVA prophylaxis.  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  No clot. CT coronary morphology and loop recorder placement 04/24/2021.  Patient did develop a bit of a nonproductive cough chest x-ray showed no active  disease and SARS coronavirus negative.  Follow-up swallow study completed and maintained on a regular diet.   Patient with resulting functional deficits with mobility, transfers and self-care.  We will set goals for min A/supervision with PT/OT   Due to the current state of emergency, patients may not be receiving their 3-hours of Medicare-mandated therapy.  See Team Conference Notes for weekly updates to the plan of care

## 2021-05-01 DIAGNOSIS — R7309 Other abnormal glucose: Secondary | ICD-10-CM

## 2021-05-01 DIAGNOSIS — E1165 Type 2 diabetes mellitus with hyperglycemia: Secondary | ICD-10-CM

## 2021-05-01 LAB — GLUCOSE, CAPILLARY
Glucose-Capillary: 285 mg/dL — ABNORMAL HIGH (ref 70–99)
Glucose-Capillary: 157 mg/dL — ABNORMAL HIGH (ref 70–99)
Glucose-Capillary: 195 mg/dL — ABNORMAL HIGH (ref 70–99)
Glucose-Capillary: 144 mg/dL — ABNORMAL HIGH (ref 70–99)
Glucose-Capillary: 189 mg/dL — ABNORMAL HIGH (ref 70–99)

## 2021-05-01 NOTE — Progress Notes (Signed)
Orthopedic Tech Progress Note Patient Details:  [name] 05-05-1956 [id number] Ordered outside vendor brace Patient ID: [name], female   DOB: 1956/10/26, 65 y.o.   MRN: [id number]   [name] 05/01/2021, 1:42 PM

## 2021-05-01 NOTE — Progress Notes (Signed)
Physical Therapy Session Note  Patient Details  Name: [name] MRN: [id number] Date of Birth: Oct 03, 1956  Today's Date: 05/01/2021 PT Individual Time: 1100-1200 and 1330-1410 PT Individual Time Calculation (min): 60 min and 40 mins  Short Term Goals: Week 1:  PT Short Term Goal 1 (Week 1): Pt will complete bed mobility with minA PT Short Term Goal 2 (Week 1): Pt will complete bed<>chair transfers with minA PT Short Term Goal 3 (Week 1): Pt will ambulate 8ft with modA and LRAD Week 2:     Skilled Therapeutic Interventions/Progress Updates:    session 1: pt received in bed and agreeable to therapy. Pt very lethargic and fell asleep multiple times during session but easily awoken to name. Pt denied to transfer OOB reporting she had just gotten back to bed after sitting in [facility] for "a long time". Pt directed in bed mobility with rolling to R and Lx5 each with emphasis on improved technique and coordination to promote increased I with supine<>sit, rails used min A. Pt directed in 2x10 bridges with emphasis on control of LLE and hip, tactile cues for positioning, 2x10 heel slides Bil, 2x10 self assisted bicep curls and front raises to shoulder height with VC for increased activation on Lt side for improved coordination and strengthening. Pt directed in upward scooting in bed with VC for increased activation of LLE and LUE at min A. Pt left in bed, All needs in reach and in good condition. Call light in hand.  And alarm set.   Session 2: pt received in bed and agreeable to therapy. Pt directed in supine>sit min A and sat EOB CGA for dynamic functional tasks at min A. Pt directed in x5 Sit to stand to stedy from bed height min A for LUE grip and L knee tactile cues for increased quad and glute activation. Pt directed in supine>sit CGA with tactile cues for LLE abduction to bring to bed and improve positioning. Pt reported increased fatigue and benefited from rest breaks throughout session. Pt left in  supine, All needs in reach and in good condition. Call light in hand.  And alarm set. NCT present.  Therapy Documentation Precautions:  Precautions Precautions: Fall Precaution Comments: poor L side sensory awareness Restrictions Weight Bearing Restrictions: No General:   Vital Signs: Therapy Vitals Temp: 98.1 F (36.7 C) Temp Source: Oral Pulse Rate: 77 Resp: 20 BP: (!) 173/70 Patient Position (if appropriate): Lying Oxygen Therapy SpO2: 97 % O2 Device: Room Air Pain:   Mobility:   Locomotion :    Trunk/Postural Assessment :    Balance:   Exercises:   Other Treatments:      Therapy/Group: Individual Therapy  Junie Panning 05/01/2021, 3:15 PM

## 2021-05-01 NOTE — Progress Notes (Signed)
Patient ID: ABUK SELLECK, female   DOB: 1956/01/23, 65 y.o.   MRN: [id number] Met with the patient to introduce self and the role of the nurse CM. Reviewed secondary stroke risks including HTN, HLD (Trig 225/LDDL 105; goal of <70, and DM (A1c 9.3) . Reviewed usual dining and patient noted she would eat light carb for breakfast and did not get a break until 1p when she would get a small snack and would eat her meal later in the day. Quick recovery of low sugars typically treated with OJ or PB and crackers. Discussed adding a protein to the carb for breakfast and her snacks. Also reviewed 2 carb snack before bed with insulin BID. Patient given handouts on DASH diet, cooking without salt tips and an eating plan for high triglycerides. Continue to follow along to discharge to address questions and educational needs. Collaborate with the SW to facilitate preparation for discharge. Margarito Liner

## 2021-05-01 NOTE — Progress Notes (Signed)
Physical Therapy Session Note  Patient Details  Name: [name] MRN: [id number] Date of Birth: 08/13/1956  Today's Date: 05/01/2021 PT Individual Time: 0900-1000 PT Individual Time Calculation (min): 60 min   Short Term Goals: Week 1:  PT Short Term Goal 1 (Week 1): Pt will complete bed mobility with minA PT Short Term Goal 2 (Week 1): Pt will complete bed<>chair transfers with minA PT Short Term Goal 3 (Week 1): Pt will ambulate 83ft with modA and LRAD  Skilled Therapeutic Interventions/Progress Updates:    Pt greeted supine in bed at start of session. Agreeable to PT session. Reports no pain but reports need to use bathroom. Supine<>sit with modA for BLE and trunk elevation via log rolling technique. Needs CGA for sitting balance at EOB while unsupported. Completed squat<>pivot transfer with modA and L knee block from EOB to drop-arm BSC. Pt [place] of [place]. Provided Stedy due to need for posterior pericare. Requires min/modA to stand in [place] from [facility] with cues needed for awareness of LUE placement to grab bar on Stedy. Able to stand in [place] with minA for balance. Needing totalA for posterior pericare in standing. Pt needing a few seated rest breaks while seated on perched position which she was able to sit with CGA. Donned new clean briefs and disposable pants with maxA and needed modA for donning disposable shirt. Performed several bouts of stands in [place] from EOB height requiring minA while dressing. Stedy transfer to w/c and transported to [employer] in w/c for time management. Squat<>pivot with modA to [place] and completed 4 minutes with BLE only at workload 5. She needed frequent cues for L attention and awareness of L foot placement and L knee alignment on [place]. Squat<>pivot with modA back to w/c and returned to room. Pt agreeable to remain seated upright in w/c until next PT session in 1hr. Safety belt alarm on and needs in reach at end of session.  Therapy  Documentation Precautions:  Precautions Precautions: Fall Precaution Comments: poor L side sensory awareness Restrictions Weight Bearing Restrictions: No General:     Therapy/Group: Individual Therapy  [name] 05/01/2021, 7:42 AM

## 2021-05-01 NOTE — Progress Notes (Signed)
Occupational Therapy Session Note  Patient Details  Name: TAMANI DURNEY MRN: [id number] Date of Birth: June 15, 1956  Today's Date: 05/01/2021 OT Individual Time: 1445-1535 OT Individual Time Calculation (min): 50 min    Short Term Goals: Week 1:  OT Short Term Goal 1 (Week 1): pt will complete squat pivot transfers with mod A. OT Short Term Goal 2 (Week 1): Pt will be able to hold static stand with min A. OT Short Term Goal 3 (Week 1): Pt will don shirt with min A OT Short Term Goal 4 (Week 1): Pt will be able to don pants over feet with min A. OT Short Term Goal 5 (Week 1): Pt will be able to pull pants over hips with mod A  Skilled Therapeutic Interventions/Progress Updates:    1:1. Pt received in bed agreeable to OT after increased time for arousal. Pt completes stedy transfer with MIN A for sit to stand and VC for midline orientation. Pt agreeable ot work on L arm/L attention at tabletop reaching for small foam cubes to place into cup with grip slips 50% of time or pt unaware did not have the cube grasped. Pt able ot remove 5 push pins from cork board and place into bin using key pinch. Pt logs into email to check for FMLA paper work with min facilitation of LUE ot control shift key for capitalization and VC for L visual scanning during task. Exited session with pt seated in bed, exit alarm on and call light in reach   Therapy Documentation Precautions:  Precautions Precautions: Fall Precaution Comments: poor L side sensory awareness Restrictions Weight Bearing Restrictions: No General:   Vital Signs: Therapy Vitals Temp: 98.3 F (36.8 C) Temp Source: Oral Pulse Rate: 77 Resp: 18 BP: (!) 155/70 Patient Position (if appropriate): Lying Oxygen Therapy SpO2: 93 % O2 Device: Room Air Pain: Pain Assessment Pain Scale: 0-10 Pain Score: Asleep Pain Type: Chronic pain Pain Location: Back Pain Orientation: Lower Pain Intervention(s): Medication (See  eMAR);Repositioned ADL: ADL Eating: Set up Grooming: Setup Upper Body Bathing: Minimal assistance Where Assessed-Upper Body Bathing: Sitting at sink Lower Body Bathing: Maximal assistance Where Assessed-Lower Body Bathing: Sitting at sink Upper Body Dressing: Moderate assistance Where Assessed-Upper Body Dressing: Sitting at sink Lower Body Dressing: Maximal assistance Where Assessed-Lower Body Dressing:  (used stedy to stand) Toileting: Maximal assistance (with A from stedy) Toilet Transfer: Maximal assistance Toilet Transfer Method: Other (comment) (stedy) Toilet Transfer Equipment: Bedside commode (BSC over toilet) Vision   Perception    Praxis   Exercises:   Other Treatments:     Therapy/Group: Individual Therapy  [name] 05/01/2021, 6:54 AM

## 2021-05-01 NOTE — Progress Notes (Signed)
[facility] PHYSICAL MEDICINE & REHABILITATION PROGRESS NOTE  Subjective/Complaints: Patient seen laying in bed this morning eating breakfast.  She states she slept well overnight.  She states she thinks she had very good first day of therapies yesterday.  Encourage patient to make conscious effort to use left upper extremity more.  ROS: Denies CP, SOB, N/V/D  Objective: Vital Signs: Blood pressure (!) 155/70, pulse 77, temperature 98.3 F (36.8 C), temperature source Oral, resp. rate 18, SpO2 93 %. No results found. Recent Labs    04/29/21 0415 04/30/21 0537  WBC 11.9* 11.5*  HGB 12.1 12.7  HCT 36.1 38.7  PLT 308 310   Recent Labs    04/29/21 0415 04/30/21 0537  NA 134* 132*  K 4.0 4.3  CL 97* 98  CO2 27 27  GLUCOSE 174* 187*  BUN 13 13  CREATININE 0.90 0.95  CALCIUM 8.8* 8.8*    Intake/Output Summary (Last 24 hours) at 05/01/2021 1310 Last data filed at 05/01/2021 0845 Gross per 24 hour  Intake 720 ml  Output --  Net 720 ml        Physical Exam: BP (!) 155/70 (BP Location: Right Arm)   Pulse 77   Temp 98.3 F (36.8 C) (Oral)   Resp 18   SpO2 93%  Constitutional: No distress . Vital signs reviewed.  Obese. HENT: Normocephalic.  Atraumatic. Eyes: EOMI. No discharge. Cardiovascular: No JVD.  RRR. Respiratory: Normal effort.  No stridor.  Bilateral clear to auscultation. GI: Non-distended.  BS +. Skin: Warm and dry.  Intact. Psych: Normal mood.  Normal behavior. Musc: No edema in extremities.  No tenderness in extremities. Neuro: Alert Follows commands.  Fair insight and awareness.  Motor:LUE 4-/5 prox to distal with apraxia.  LLE: 4-/5 prox to distal.   Assessment/Plan: 1. Functional deficits which require 3+ hours per day of interdisciplinary therapy in a comprehensive inpatient rehab setting.  Physiatrist is providing close team supervision and 24 hour management of active medical problems listed below.  Physiatrist and rehab team continue to  assess barriers to discharge/monitor patient progress toward functional and medical goals   Care Tool:  Bathing    Body parts bathed by patient: Chest,Abdomen,Right upper leg,Left upper leg,Face,Left arm   Body parts bathed by helper: Front perineal area,Buttocks,Right lower leg,Left lower leg,Right arm     Bathing assist Assist Level: Maximal Assistance - Patient 24 - 49%     Upper Body Dressing/Undressing Upper body dressing   What is the patient wearing?: Pull over shirt    Upper body assist Assist Level: Moderate Assistance - Patient 50 - 74%    Lower Body Dressing/Undressing Lower body dressing      What is the patient wearing?: Incontinence brief     Lower body assist Assist for lower body dressing: Maximal Assistance - Patient 25 - 49%     Toileting Toileting    Toileting assist Assist for toileting: Maximal Assistance - Patient 25 - 49%     Transfers Chair/bed transfer  Transfers assist     Chair/bed transfer assist level: Dependent - mechanical lift     Locomotion Ambulation   Ambulation assist      Assist level: 2 helpers (modA with +2 assist for w/c follow) Assistive device: Parallel bars Max distance: 10   Walk 10 feet activity   Assist     Assist level: 2 helpers Assistive device: Parallel bars   Walk 50 feet activity   Assist Walk 50 feet with 2 turns  activity did not occur: Safety/medical concerns         Walk 150 feet activity   Assist Walk 150 feet activity did not occur: Safety/medical concerns         Walk 10 feet on uneven surface  activity   Assist Walk 10 feet on uneven surfaces activity did not occur: Safety/medical concerns         Wheelchair     Assist Will patient use wheelchair at discharge?: No   Wheelchair activity did not occur: N/A         Wheelchair 50 feet with 2 turns activity    Assist    Wheelchair 50 feet with 2 turns activity did not occur: N/A       Wheelchair  150 feet activity     Assist  Wheelchair 150 feet activity did not occur: N/A        Medical Problem List and Plan: 1.Left side hemiparesis to right thalamic infarction. Status post mechanical thrombectomy of right PCA P2 occlusion 04/19/2021. Status post loop recorder  Continue CIR  WHO ordered 2. Antithrombotics: -DVT/anticoagulation:SCDs -antiplatelet therapy: Aspirin 81 mg daily, Plavix 75 mg dailyx3 weeks then aspirin alone 3. Pain Management:Tylenol as needed 4. Mood:Zoloft 100 mg daily, melatonin as needed. Provide emotional support -antipsychotic agents: N/A 5. Neuropsych: This patientiscapable of making decisions on herown behalf. 6. Skin/Wound Care:Routine skin checks 7. Fluids/Electrolytes/Nutrition:Routine in and outs 8. Diabetes mellitus with hyperglycemia. Hemoglobin A1c 9.3.  Patient on Actos, Januvia 100 mg, Glucotrol XL5 mg daily,metformin 1000 mg twice daily prior to admission.   Currently onNovoLog 5units 3 times daily with meals, Levemir 12units twice daily. Resume glucophage at 500mg  bid -Diabetic teaching  Labile on 5/19  Monitor with increased mobility 9. Hypertension. Cozaar 25 mg twice daily  Elevated on 5/19, continue to monitor, will consider medication adjustments tomorrow if persistent  Monitor with increased mobility 10. Hyperlipidemia. Lipitor 11. Obesity. BMI 33.19. Dietary follow-up 12. GERD. Protonix 13. Constipation: scheduled senna-s, encourage liquids, fruits, vegetables -dulcolax suppository/fleet enema prn  Adjust bowel meds as necessary 14. Cough: COVID negative -appears to be mostly pharyngeal, upper airway -Mucinex DM 15.  Hyponatremia  Sodium 132 on 5/18  Continue to monitor 16.  Leukocytosis  WBCs 11.5 on 5/18, labs ordered for tomorrow  Afebrile  Continue to monitor  LOS: 2 days A FACE TO FACE EVALUATION  WAS PERFORMED  [name] [name] 05/01/2021, 1:10 PM

## 2021-05-02 ENCOUNTER — Other Ambulatory Visit (HOSPITAL_COMMUNITY): Payer: Self-pay

## 2021-05-02 DIAGNOSIS — M545 Low back pain, unspecified: Secondary | ICD-10-CM

## 2021-05-02 DIAGNOSIS — G8929 Other chronic pain: Secondary | ICD-10-CM

## 2021-05-02 LAB — GLUCOSE, CAPILLARY
Glucose-Capillary: 227 mg/dL — ABNORMAL HIGH (ref 70–99)
Glucose-Capillary: 268 mg/dL — ABNORMAL HIGH (ref 70–99)
Glucose-Capillary: 98 mg/dL (ref 70–99)
Glucose-Capillary: 158 mg/dL — ABNORMAL HIGH (ref 70–99)

## 2021-05-02 LAB — CBC WITH DIFFERENTIAL/PLATELET
Abs Immature Granulocytes: 0.15 10*3/uL — ABNORMAL HIGH (ref 0.00–0.07)
Basophils Absolute: 0.1 10*3/uL (ref 0.0–0.1)
Basophils Relative: 1 %
Eosinophils Absolute: 0.4 10*3/uL (ref 0.0–0.5)
Eosinophils Relative: 3 %
HCT: 36.9 % (ref 36.0–46.0)
Hemoglobin: 12.4 g/dL (ref 12.0–15.0)
Immature Granulocytes: 1 %
Lymphocytes Relative: 13 %
Lymphs Abs: 1.5 10*3/uL (ref 0.7–4.0)
MCH: 28.9 pg (ref 26.0–34.0)
MCHC: 33.6 g/dL (ref 30.0–36.0)
MCV: 86 fL (ref 80.0–100.0)
Monocytes Absolute: 0.6 10*3/uL (ref 0.1–1.0)
Monocytes Relative: 6 %
Neutro Abs: 8.6 10*3/uL — ABNORMAL HIGH (ref 1.7–7.7)
Neutrophils Relative %: 76 %
Platelets: 339 10*3/uL (ref 150–400)
RBC: 4.29 MIL/uL (ref 3.87–5.11)
RDW: 13.1 % (ref 11.5–15.5)
WBC: 11.3 10*3/uL — ABNORMAL HIGH (ref 4.0–10.5)
nRBC: 0 % (ref 0.0–0.2)

## 2021-05-02 MED ORDER — METFORMIN HCL 500 MG PO TABS
1000.0000 mg | ORAL_TABLET | Freq: Two times a day (BID) | ORAL | Status: DC
  Administered 2021-05-02 – 2021-05-27 (×46): 1000 mg via ORAL
  Filled 2021-05-02 (×48): qty 2

## 2021-05-02 NOTE — Progress Notes (Signed)
[name] PHYSICAL MEDICINE & REHABILITATION PROGRESS NOTE  Subjective/Complaints: Patient seen sitting up in a chair this morning.  He states he slept well overnight.  He notes she had some left eye pain this morning, but that self resolved.  She states that she had some back pain after a long day of therapy yesterday.  She states she is using her left upper extremity more.  ROS: Denies CP, SOB, N/V/D  Objective: Vital Signs: Blood pressure (!) 144/58, pulse 69, temperature 98 F (36.7 C), resp. rate 16, SpO2 96 %. No results found. Recent Labs    04/30/21 0537 05/02/21 0636  WBC 11.5* 11.3*  HGB 12.7 12.4  HCT 38.7 36.9  PLT 310 339   Recent Labs    04/30/21 0537  NA 132*  K 4.3  CL 98  CO2 27  GLUCOSE 187*  BUN 13  CREATININE 0.95  CALCIUM 8.8*    Intake/Output Summary (Last 24 hours) at 05/02/2021 1043 Last data filed at 05/02/2021 0700 Gross per 24 hour  Intake 960 ml  Output --  Net 960 ml        Physical Exam: BP (!) 144/58 (BP Location: Right Arm)   Pulse 69   Temp 98 F (36.7 C)   Resp 16   SpO2 96%  Constitutional: No distress . Vital signs reviewed.  Obese. HENT: Normocephalic.  Atraumatic. Eyes: EOMI. No discharge. Cardiovascular: No JVD.  RRR. Respiratory: Normal effort.  No stridor.  Bilateral clear to auscultation. GI: Non-distended.  BS +. Skin: Warm and dry.  Intact. Psych: Normal mood.  Normal behavior. Musc: No edema in extremities.  No tenderness in extremities. Neuro: Alert Follows commands.  Fair insight and awareness.  Motor:LUE 4/5 prox to distal with apraxia.  LLE: 4-/5 prox to distal.   Assessment/Plan: 1. Functional deficits which require 3+ hours per day of interdisciplinary therapy in a comprehensive inpatient rehab setting.  Physiatrist is providing close team supervision and 24 hour management of active medical problems listed below.  Physiatrist and rehab team continue to assess barriers to discharge/monitor  patient progress toward functional and medical goals   Care Tool:  Bathing    Body parts bathed by patient: Chest,Abdomen,Right upper leg,Left upper leg,Face,Left arm   Body parts bathed by helper: Front perineal area,Buttocks,Right lower leg,Left lower leg,Right arm     Bathing assist Assist Level: Maximal Assistance - Patient 24 - 49%     Upper Body Dressing/Undressing Upper body dressing   What is the patient wearing?: Pull over shirt    Upper body assist Assist Level: Moderate Assistance - Patient 50 - 74%    Lower Body Dressing/Undressing Lower body dressing      What is the patient wearing?: Incontinence brief,Pants     Lower body assist Assist for lower body dressing: Moderate Assistance - Patient 50 - 74%     Toileting Toileting    Toileting assist Assist for toileting: Moderate Assistance - Patient 50 - 74%     Transfers Chair/bed transfer  Transfers assist     Chair/bed transfer assist level: 2 Helpers     Locomotion Ambulation   Ambulation assist      Assist level: 2 helpers ([place] with +2 assist for w/c follow) Assistive device: Parallel bars Max distance: 10   Walk 10 feet activity   Assist     Assist level: 2 helpers Assistive device: Parallel bars   Walk 50 feet activity   Assist Walk 50 feet with 2 turns activity  did not occur: Safety/medical concerns         Walk 150 feet activity   Assist Walk 150 feet activity did not occur: Safety/medical concerns         Walk 10 feet on uneven surface  activity   Assist Walk 10 feet on uneven surfaces activity did not occur: Safety/medical concerns         Wheelchair     Assist Will patient use wheelchair at discharge?: No   Wheelchair activity did not occur: N/A         Wheelchair 50 feet with 2 turns activity    Assist    Wheelchair 50 feet with 2 turns activity did not occur: N/A       Wheelchair 150 feet activity     Assist  Wheelchair  150 feet activity did not occur: N/A        Medical Problem List and Plan: 1.Left side hemiparesis to right thalamic infarction. Status post mechanical thrombectomy of right PCA P2 occlusion 04/19/2021. Status post loop recorder  Continue CIR  WHO nightly 2. Antithrombotics: -DVT/anticoagulation:SCDs -antiplatelet therapy: Aspirin 81 mg daily, Plavix 75 mg dailyx3 weeks then aspirin alone 3. Pain Management:Tylenol as needed  Robaxin as needed started on 5/20 4. Mood:Zoloft 100 mg daily, melatonin as needed. Provide emotional support -antipsychotic agents: N/A 5. Neuropsych: This patientiscapable of making decisions on herown behalf.  Discussed with Neuropsych - appreciate eval 6. Skin/Wound Care:Routine skin checks 7. Fluids/Electrolytes/Nutrition:Routine in and outs 8. Diabetes mellitus with hyperglycemia. Hemoglobin A1c 9.3.  Patient on Actos, Januvia 100 mg, Glucotrol XL5 mg daily,metformin 1000 mg twice daily prior to admission.   Currently onNovoLog 5units 3 times daily with meals, Levemir 12units twice daily.  Glucophage at 500mg  bid, increased to 1000 BID on 5/20  Diabetic teaching  Monitor with increased mobility 9. Hypertension. Cozaar 25 mg twice daily, increased to 50 on 5/20  Monitor with increased mobility 10. Hyperlipidemia. Lipitor 11. Obesity. BMI 33.19. Dietary follow-up 12. GERD. Protonix 13. Constipation: scheduled senna-s, encourage liquids, fruits, vegetables -dulcolax suppository/fleet enema prn  Adjust bowel meds as necessary 14. Cough: COVID negative -appears to be mostly pharyngeal, upper airway -Mucinex DM 15.  Hyponatremia  Sodium 132 on 5/18  Continue to monitor 16.  Leukocytosis  WBCs 11.3 on 5/20  Afebrile  Continue to monitor  LOS: 3 days A FACE TO FACE EVALUATION WAS PERFORMED  [name] [name] 05/02/2021, 10:43 AM

## 2021-05-02 NOTE — Progress Notes (Signed)
Occupational Therapy Session Note  Patient Details  Name: [name] MRN: [id number] Date of Birth: 12/25/1955  Today's Date: 05/02/2021 OT Individual Time: 1030-1115 OT Individual Time Calculation (min): 45 min    Short Term Goals: Week 1:  OT Short Term Goal 1 (Week 1): pt will complete squat pivot transfers with mod A. OT Short Term Goal 2 (Week 1): Pt will be able to hold static stand with min A. OT Short Term Goal 3 (Week 1): Pt will don shirt with min A OT Short Term Goal 4 (Week 1): Pt will be able to don pants over feet with min A. OT Short Term Goal 5 (Week 1): Pt will be able to pull pants over hips with mod A  Skilled Therapeutic Interventions/Progress Updates:    Pt received in bed requesting a shower. Used stedy to transport pt to tub bench in shower. It was a very easy transfer as pt was able to pull to stand and sit with close S and held her posture in stedy well. On bench, she bathed with min A even using L arm to wash R arm, good balance on bench.  Moved to wc to dress.  Improved LUE coordination with dressing.  To get pants over hips, pt held onto bar on her R side and held static balance while therapist pulled clothing over hips.  Pt resting in wc requesting to get into bed.  Session time over, so NT arrived to assist this pt.    Therapy Documentation Precautions:  Precautions Precautions: Fall Precaution Comments: poor L side sensory awareness Restrictions Weight Bearing Restrictions: No  Pain: Pain Assessment Pain Scale: 0-10 Pain Score: 0-No pain ADL: ADL Eating: Set up Grooming: Setup Upper Body Bathing: Supervision/safety Where Assessed-Upper Body Bathing: Shower Lower Body Bathing: Minimal assistance Where Assessed-Lower Body Bathing: Shower Upper Body Dressing: Minimal assistance Where Assessed-Upper Body Dressing: Sitting at sink Lower Body Dressing: Maximal assistance Where Assessed-Lower Body Dressing: Sitting at sink,Standing at  sink Toileting: Maximal assistance (with A from stedy) Toilet Transfer: Maximal assistance Toilet Transfer Method: Other (comment) (stedy) Toilet Transfer Equipment: Bedside commode (BSC over toilet)   Therapy/Group: Individual Therapy  [place] 05/02/2021, 12:50 PM

## 2021-05-02 NOTE — Progress Notes (Signed)
Speech Language Pathology Daily Session Note  Patient Details  Name: [name] MRN: [id number] Date of Birth: [birth year]  Today's Date: 05/02/2021 SLP Individual Time: 1200-1230 SLP Individual Time Calculation (min): 30 min  Short Term Goals: Week 1: SLP Short Term Goal 1 (Week 1): Pt will demonstrate sustained attention to functional task for 45 minutes with supervision A verbal cues. SLP Short Term Goal 2 (Week 1): Pt will demonstrate mildly complex problem solving skills with supervision A verbal cues. SLP Short Term Goal 3 (Week 1): Pt will self-monitor and self-correct functional errors with min A verbal cues in problem solving tasks.  Skilled Therapeutic Interventions: Skilled ST services focused on cognitive skills. SLP facilitated mild complex problem solving, error awareness and recall skills within task utilizing kitchen organization worksheet. Pt required min A fade to supervision A verbal cues, primarily due to attention and recall. Pt was left with call bell within reach and bed alarm set. Recommend to continue skilled ST services.     Pain Pain Assessment Pain Score: 0-No pain  Therapy/Group: Individual Therapy  Jahdiel Krol  [facility] 05/02/2021, 4:02 PM

## 2021-05-02 NOTE — Care Management (Signed)
[facility] Individual Statement of Services  Patient Name:  [name]  Date:  05/02/2021  Welcome to the [facility].  Our goal is to provide you with an individualized program based on your diagnosis and situation, designed to meet your specific needs.  With this comprehensive rehabilitation program, you will be expected to participate in at least 3 hours of rehabilitation therapies Monday-Friday, with modified therapy programming on the weekends.  Your rehabilitation program will include the following services:  Physical Therapy (PT), Occupational Therapy (OT), Speech Therapy (ST), 24 hour per day rehabilitation nursing, Therapeutic Recreaction (TR), Psychology, Neuropsychology, Care Coordinator, Rehabilitation Medicine, Nutrition Services, Pharmacy Services and Other  Weekly team conferences will be held on Wednesdays to discuss your progress.  Your Inpatient Rehabilitation Care Coordinator will talk with you frequently to get your input and to update you on team discussions.  Team conferences with you and your family in attendance may also be held.  Expected length of stay: 18-21 days  Overall anticipated outcome: Supervision to Contact guard  Depending on your progress and recovery, your program may change. Your Inpatient Rehabilitation Care Coordinator will coordinate services and will keep you informed of any changes. Your Inpatient Rehabilitation Care Coordinator's name and contact numbers are listed  below.  The following services may also be recommended but are not provided by the [facility] will be made to provide these services after discharge if needed.  Arrangements include referral to agencies that provide these services.  Your insurance has been verified to be:  [employer]  Your primary doctor is:  Fenton Malling  Pertinent information will be shared with your doctor and your insurance company.  Inpatient Rehabilitation Care Coordinator:  Ovidio Kin, [place] or Emilia Beck  Information discussed with and copy given to patient by: Rana Snare, 05/02/2021, 12:33 PM

## 2021-05-02 NOTE — Consult Note (Signed)
Neuropsychological Consultation   Patient:   [name]   DOB:   [birth year]  MR Number:  [id number]  Location:  [facility] [id number] [place] [address] Dept: [place]: [phone]           Date of Service:   05/02/2021  Start Time:   9 AM End Time:   10 AM  Provider/Observer:  Ilean Skill, Psy.D.       Clinical Neuropsychologist       Billing Code/Service: [phone]  Chief Complaint:    [name] is a 65 year old female with a history of uncontrolled diabetes, hypertension, hyperlipidemia, remote tobacco abuse, obesity, anxiety/depression.  Patient presented on 04/19/2020 while she was at work at [employer] [facility] [employer].  The patient had acute onset of left-sided weakness and poor balance as well as dizziness.  CT/MRI as well as MRA showed small acute infarct of the anterior lateral right thalamus adjacent to the posterior limb of the right internal capsule.  Right PCA and M1 segment occlusion.  Patient did receive tPA.  Patient also underwent thrombectomy 04/19/2021 with complete recanalization per IR.  Patient with ongoing left-sided weakness, impaired proprioceptive experience for left side and decreased functional mobility with admission to CIR for rehabilitative efforts.  Reason for Service:  Was referred for neuropsychological consultation due to concerns around adjustment and coping with the setting of prior depression and anxiety.  Below is the HPI for the current admission.  [name] is a 65 year old right-handed female with history of uncontrolled diabetes mellitus, hypertension, hyperlipidemia, remote tobacco abuse, obesity with BMI 33.45, anxiety/depression. Per chart review patient lives alone. Independent prior to admission and still working at [employer]. 1 level home 3 steps to entry. She plans to stay with her daughter and  son-in-law on discharge. Presented 04/19/2021 with acute onset of left-sided weakness and poor balance as well as dizziness. CT/MRI as well as MRA showed small acute infarct of the anterior lateral right thalamus adjacent to the posterior limb of the right internal capsule. Right PCA and P1 segment occlusion. Patient did receive tPA. Admission chemistries unremarkable except sodium 133 chloride 96 glucose 272 BUN 25 creatinine 1.09 hemoglobin A1c 9.3. Patient did undergo thrombectomy 04/19/2021 with complete recanalization per interventional radiology. Echocardiogram with ejection fraction of 70 to 75% no wall motion abnormalities grade 1 diastolic dysfunction. Currently maintained on aspirin 81 mg daily and Plavix 75 mg daily for CVA prophylaxis. 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 No clot./CT coronary morphologyand loop recorder placement 04/24/2021. Patient did develop a bit of a nonproductive cough chest x-ray showed no active diseaseand SARS coronavirus negative. Follow-up swallow study completed and maintained on a regular diet.Due to patient's left-sided weakness and decreased functional mobility she was admitted for a comprehensive rehab program.  Current Status:  Patient was alert and oriented sitting in a wheelchair with clear motor deficits for left arm.  Was not able to observe her walking but she has been standing and pivoting and walking with the parallel bars in therapies already.  Patient was able to move her left arm with spasticity but showed poor proprioceptive capacity when moving it or not paying attention to it.  Patient denied any exacerbation of her depression and anxiety but did appear to be anxious to get home as soon  as possible.  Patient was agreeable to fully participate in therapies and has been active participant throughout.  Patient complained of times  of drowsiness and was anxious to get into her bed after our meeting so she could get a rest before her 1030 PT session.  Behavioral Observation: [name]  presents as a 65 y.o.-year-old Right Caucasian Female who appeared her stated age. her dress was Appropriate and she was Well Groomed and her manners were Appropriate to the situation.  her participation was indicative of Appropriate and Redirectable behaviors.  There were physical disabilities noted.  she displayed an appropriate level of cooperation and motivation.     Interactions:    Active Appropriate  Attention:   abnormal and attention span appeared shorter than expected for age  Memory:   within normal limits; recent and remote memory intact  Visuo-spatial:  not examined but there were also indications of proprioceptive deficits.  Speech (Volume):  normal  Speech:   normal; normal  Thought Process:  Coherent and Relevant  Though Content:  WNL; not suicidal and not homicidal  Orientation:   person, place, time/date and situation  Judgment:   Good  Planning:   Fair  Affect:    Anxious  Mood:    Anxious  Insight:   Good  Intelligence:   normal  Medical History:   Past Medical History:  Diagnosis Date  . Anxiety   . Arthritis   . Cataract    left  . Depression   . Diabetes mellitus without complication ([place])   . Environmental and seasonal allergies   . GERD (gastroesophageal reflux disease)   . Hyperlipidemia   . Hypertension   . Joint pain    as reported by patient  . Urinary incontinence    as stated by patient         Patient Active Problem List   Diagnosis Date Noted  . Uncontrolled type 2 diabetes mellitus with hyperglycemia ([place])   . Labile blood glucose   . Leukocytosis   . Hyponatremia   . Essential hypertension   . Slow transit constipation   . Hemiparesis affecting left side as late effect of stroke ([place])   . Right thalamic infarction ([place]) 04/29/2021  . Aortic valve mass   .  Cerebrovascular accident (CVA) ([place]) 04/19/2021  . Ischemic stroke ([place]) 04/18/2021  . Poorly controlled type 2 diabetes mellitus ([place]) 04/18/2021  . Dyslipidemia, goal LDL below 70 04/18/2021  . AKI (acute kidney injury) ([place]) 04/18/2021  . Hypertensive urgency 04/18/2021  . Recurrent major depressive disorder, in partial remission ([place]) 09/20/2020  . Status post total replacement of hip 04/04/2018  . Primary osteoarthritis of right hip 02/20/2018  . Allergic rhinitis 10/28/2015  . Acute stress disorder 08/01/2015  . Anxiety 08/01/2015  . Clinical depression 08/01/2015  . Diabetes ([place]) 08/01/2015  . Diverticulosis of colon 08/01/2015  . Generalized pruritus 08/01/2015  . BP (high blood pressure) 08/01/2015  . Cannot sleep 08/01/2015  . Adiposity 08/01/2015  . Detrusor muscle hypertonia 08/01/2015  . Hypercholesterolemia without hypertriglyceridemia 08/01/2015  . Female stress incontinence 08/01/2015  . Avitaminosis D 08/01/2015    Psychiatric History:  Patient with past medical history of anxiety depression with documentation dating back to at least 2016.  Patient has continued on her outside depression medication (Zoloft).  Family Med/Psych History:  Family History  Problem Relation Age of Onset  . Bone cancer Brother   . Aneurysm Mother        brain  .  Diabetes Father   . CAD Father   . Heart failure Father   . Colon cancer Father   . Cancer Father   . Alzheimer's disease Paternal Grandmother   . Dementia Paternal Grandmother   . Mental illness Paternal Grandfather   . Healthy Daughter   . Healthy Son   . Breast cancer Neg Hx     Impression/DX:  [name] is a 65 year old female with a history of uncontrolled diabetes, hypertension, hyperlipidemia, remote tobacco abuse, obesity, anxiety/depression.  Patient presented on 04/19/2020 while she was at work at [employer].  The patient had acute onset of left-sided weakness and poor balance as  well as dizziness.  CT/MRI as well as MRA showed small acute infarct of the anterior lateral right thalamus adjacent to the posterior limb of the right internal capsule.  Right PCA and M1 segment occlusion.  Patient did receive tPA.  Patient also underwent thrombectomy 04/19/2021 with complete recanalization per IR.  Patient with ongoing left-sided weakness, impaired proprioceptive experience for left side and decreased functional mobility with admission to CIR for rehabilitative efforts.  Patient was alert and oriented sitting in a wheelchair with clear motor deficits for left arm.  Was not able to observe her walking but she has been standing and pivoting and walking with the parallel bars in therapies already.  Patient was able to move her left arm with spasticity but showed poor proprioceptive capacity when moving it or not paying attention to it.  Patient denied any exacerbation of her depression and anxiety but did appear to be anxious to get home as soon as possible.  Patient was agreeable to fully participate in therapies and has been active participant throughout.  Patient complained of times of drowsiness and was anxious to get into her bed after our meeting so she could get a rest before her 1030 PT session.  Disposition/Plan:  Today we worked on coping and adjustment issues dealing with recent thalamic/internal capsule right hemisphere stroke.  Diagnosis:    Right thalamic infarction [facility]) - Plan: Ambulatory referral to Neurology  Dyslipidemia, goal LDL below 70 - Plan: atorvastatin (LIPITOR) tablet 40 mg         Electronically Signed   _______________________ Ilean Skill, Psy.D. Clinical Neuropsychologist

## 2021-05-02 NOTE — Progress Notes (Signed)
Physical Therapy Session Note  Patient Details  Name: [name] MRN: [id number] Date of Birth: 09/27/1956  Today's Date: 05/02/2021 PT Individual Time: 0800-0830  + 1300-1410 PT Individual Time Calculation (min): 30 min  + 70 min  Short Term Goals: Week 1:  PT Short Term Goal 1 (Week 1): Pt will complete bed mobility with minA PT Short Term Goal 2 (Week 1): Pt will complete bed<>chair transfers with minA PT Short Term Goal 3 (Week 1): Pt will ambulate 50ft with modA and LRAD  Skilled Therapeutic Interventions/Progress Updates:     1st session: Pt greeted supine in bed, sleeping soundly but awakens to voice and is agreeable to PT session. No reports of pain. Supine<>sit completed with minA with use of bed features. Performed squat<>pivot transfer with modA towards stronger R side with L knee block and cues for hand placement. Wheeled to main rehab gym for time management into // bars. Sit<>stand in // bar with minA and L knee block and hand-over-hand assist for LUE grip to // bar. Worked on lateral weight shifts L<>R in // bar with minA and L knee block for LLE weight bearing and quad facilitation. Ambulated length of // bar with +2 assist for w/c follow and modA for gait facilitation. Maintained L knee block during stance and pt able to initiate swing phase of LLE. Returned to room in w/c for time management and she remained seated in w/c at end of session with safety belt alarm on and needs within reach.   2nd session: Pt greeted supine in bed, sleeping soundly, agreeable to PT session. She reports generalized fatigue and needs some convincing to participate in OOB mobility. Supine<>sit completed with minA and use of bedrail. Donned slip-on shoes with totalA for time management. Completed squat<>pivot transfer with modA and L knee block towards weaker L side and transported to main rehab gym for time management in w/c. Squat<>pivot with modA to mat table with cues for forward weight shift  and R hand placement. Focused on blocked practice squat<>pivot transfers w/c to/from mat table. She was able to progress to minA squat<>pivot transfers (!) and was consistent with this throughout remainder of session. Introduced RW orthotic for LUE grip to [employer] - with L knee block and RW, she was able to stand with very light minA (for knee block only) 1x5 repetitions. From there, worked on Recruitment consultant with forward/backward stepping with RW and L knee block and min/modA - stepping to target working on LLE stance phase and lateral weight shifts. Wheeled out to hallway to perform gait with hand rail on R. She ambulated 28ft with heavy modA and R hand rail with 3-muskateer technique with +2 assist for w/c follow for safety. With gait, she's able to initiate LLE swing but needs L knee block for stability - she also needs assist for trunk extension and maintaining upright posture. Pt returned to room in w/c and completed squat<>pivot transfer with minA back to bed. Able to perform sit>supine with minA with bed features. Remained supine with needs in reach and bed alarm activated. Multiple rest breaks needed throughout session 2/2 fatigue.   Therapy Documentation Precautions:  Precautions Precautions: Fall Precaution Comments: poor L side sensory awareness Restrictions Weight Bearing Restrictions: No General:    Therapy/Group: Individual Therapy  [name] 05/02/2021, 7:43 AM

## 2021-05-03 LAB — GLUCOSE, CAPILLARY
Glucose-Capillary: 305 mg/dL — ABNORMAL HIGH (ref 70–99)
Glucose-Capillary: 75 mg/dL (ref 70–99)
Glucose-Capillary: 132 mg/dL — ABNORMAL HIGH (ref 70–99)
Glucose-Capillary: 147 mg/dL — ABNORMAL HIGH (ref 70–99)

## 2021-05-03 NOTE — Progress Notes (Signed)
Physical Therapy Session Note  Patient Details  Name: [name] MRN: [id number] Date of Birth: 25-Mar-1956  Today's Date: 05/03/2021 PT Individual Time: 4742-5956 + 3875-6433 PT Individual Time Calculation (min): 54 min  + 58 min  Short Term Goals: Week 1:  PT Short Term Goal 1 (Week 1): Pt will complete bed mobility with minA PT Short Term Goal 2 (Week 1): Pt will complete bed<>chair transfers with minA PT Short Term Goal 3 (Week 1): Pt will ambulate 55ft with modA and LRAD  Skilled Therapeutic Interventions/Progress Updates:     1st session: Pt greeted supine in bed, sleeping on arrival, awakens easily to voice. Agreeable to PT tx. But reports fatigue from eariler OT session. She requires convincing to participate in PT and mobilize OOB. Supine<>sit completed with supervision (!) with use of bed rail. Sit's EOB with SBA and completed squat<>pivot transfer with minA and L knee block towards R side to w/c. Wheeled to main rehab gym with [place] for energy conservation. Completed additional squat<>pivot transfer with minA and L knee bock to mat table. Performed repeated sit<>stands with L knee block and minA with therapist on stool and pt placing hand on therapist shoulder to promote forward weight shift. Used mirror for visual feedback to promote equal weight shift as she favors RLE > LLE in standing transfers. With each stand, also worked on standing tolerance and LLE weight shifts for LLE weight bearing and quad facilitation. Pt continues to report fatigue and feeling "weak". Sit>supine on mat table with minA for LLE and LUE management. BP taken while supine, sitting, standing to rule out orthostatics:  BP supine: 152/78 BP sitting: 141/71 BP standing: 135/63  Able to complete supine<>sit with minA on mat table. Completed squat<>pivot transfer with modA and L knee block towards paretic L side from mat table to w/c and wheeled back to room. Completed additional squat<>pivot with minA  towards stronger R side and L knee block. Poor safety awareness with bed mobility as she "throws" her back into the bed with BLE hanging off. Educated on safety technique and improved awareness of L hemibody. She remained supine in bed with needs in reach and bed alarm on.   Pt c/o L arm pain (distally from elbow) which moist heat pack was provided during rest breaks. MD made aware and to trial Kpad for room b/w therapy sessions  - RN made aware of order at end of session.  2nd session: Pt greeted supine in bed at start of session, agreeable to PT tx. Reports mild low back pain - pt with Kpad on in bed. Rest breaks and mobility provided for pain management. Supine<>sit with supervision with use of bed rail and cues for L hand awareness. Sit's unsupported at EOB with SBA. Completes squat<>pivot transfer with modA towards paretic L side to w/c, L knee block provided for stability. W/c transport for time management to main rehab gym. Completed squat<>pivot transfer with minA and L knee block to stronger R side from w/c to mat table. Completed 2x5 sit<>stand transfers with minA (for L knee block) and RW from lowered mat table height - cues for L quad facilitation and sequencing of hand placement in/out of orthotic on RW. Pt then completed pre-gait training with forward/backward stepping with L foot onto 3inch platform with visual target - demo's appropriate L hip/knee flexion with good activation of posterior chain for swing. Gait training 2x64ft (seated rest) requiring modA and RW with assist primarily for L knee block in stance and  truncal steadying - pt able to initiate LLE swing appropriately but difficulty weight shifting to offload. Pt reporting fatigue at this point during the session and requested to return to room to help get back to bed to get comfortable. Returned to room in w/c and completed squat<>pivot transfer with minA and L knee block from w/c to EOB with use of bedrail. Requires minA for LLE  management for sit>Supine. Pt made comfortable with Kpad on lower back. Bed alarm activated and needs within reach at end of session.  Therapy Documentation Precautions:  Precautions Precautions: Fall Precaution Comments: poor L side sensory awareness Restrictions Weight Bearing Restrictions: No General:    Therapy/Group: Individual Therapy  [name] 05/03/2021, 7:37 AM

## 2021-05-03 NOTE — Progress Notes (Signed)
Occupational Therapy Session Note  Patient Details  Name: [name] MRN: [id number] Date of Birth: 28-Jul-1956  Today's Date: 05/03/2021 OT Individual Time: 2641-5830 OT Individual Time Calculation (min): 55 min    Short Term Goals: Week 1:  OT Short Term Goal 1 (Week 1): pt will complete squat pivot transfers with mod A. OT Short Term Goal 2 (Week 1): Pt will be able to hold static stand with min A. OT Short Term Goal 3 (Week 1): Pt will don shirt with min A OT Short Term Goal 4 (Week 1): Pt will be able to don pants over feet with min A. OT Short Term Goal 5 (Week 1): Pt will be able to pull pants over hips with mod A  Skilled Therapeutic Interventions/Progress Updates:     Pt received in bed with no pain reported. Pt with poor awareness of LUE and occasionally knocking hand on objects sausing temporary pain. Heat provided at end of session for RUE pain and next PT aware  ADL:  Pt completes bathing with MIN HOH A of LUE to wash RUE and A to wash buttocks in stand. LHSS provided for B feet/back washing Pt completes UB dressing with MIN A to doff bra and S to doff shirt. MIN A to don shirt only Pt completes LB dressing with MOD A overall at sit to stand level at sink and VC for 1UE compensatory strategy to pull pants past body habitus and use of mirror for midline orentation during functional task. Pt completes footwear with MOD A for LLE Pt completes toileting with stedy and MIN A for standing balance and MAX A for 3/3 components Pt completes toileting transfer with S sit to stand in stedy. A to maintain LUE in place on stedy bar    Pt left at end of session in bed with exit alarm on, call light in reach and all needs met   Therapy Documentation Precautions:  Precautions Precautions: Fall Precaution Comments: poor L side sensory awareness Restrictions Weight Bearing Restrictions: No General:   Vital Signs: Therapy Vitals Temp: 97.9 F (36.6 C) Temp Source:  Oral Pulse Rate: 89 Resp: 20 BP: (!) 178/76 Patient Position (if appropriate): Lying Oxygen Therapy SpO2: 93 % O2 Device: Room Air Pain:   ADL: ADL Eating: Set up Grooming: Setup Upper Body Bathing: Supervision/safety Where Assessed-Upper Body Bathing: Shower Lower Body Bathing: Minimal assistance Where Assessed-Lower Body Bathing: Shower Upper Body Dressing: Minimal assistance Where Assessed-Upper Body Dressing: Sitting at sink Lower Body Dressing: Maximal assistance Where Assessed-Lower Body Dressing: Sitting at sink,Standing at sink Toileting: Maximal assistance (with A from stedy) Toilet Transfer: Maximal assistance Toilet Transfer Method: Other (comment) (stedy) Toilet Transfer Equipment: Bedside commode (BSC over toilet) Vision   Perception    Praxis   Exercises:   Other Treatments:     Therapy/Group: Individual Therapy  [name] 05/03/2021, 6:47 AM

## 2021-05-04 LAB — GLUCOSE, CAPILLARY
Glucose-Capillary: 145 mg/dL — ABNORMAL HIGH (ref 70–99)
Glucose-Capillary: 99 mg/dL (ref 70–99)
Glucose-Capillary: 92 mg/dL (ref 70–99)
Glucose-Capillary: 87 mg/dL (ref 70–99)

## 2021-05-04 NOTE — Progress Notes (Signed)
[place] PHYSICAL MEDICINE & REHABILITATION PROGRESS NOTE  Subjective/Complaints: Patient seen laying in bed this morning.  She states she slept well overnight.  She notes improvement in mobility with therapies.  She also notes improvement in back pain.  ROS: Denies CP, SOB, N/V/D  Objective: Vital Signs: Blood pressure (!) 136/96, pulse 75, temperature 98.3 F (36.8 C), resp. rate 19, SpO2 95 %. No results found. Recent Labs    05/02/21 0636  WBC 11.3*  HGB 12.4  HCT 36.9  PLT 339   No results for input(s): NA, K, CL, CO2, GLUCOSE, BUN, CREATININE, CALCIUM in the last 72 hours.  Intake/Output Summary (Last 24 hours) at 05/04/2021 0951 Last data filed at 05/04/2021 0746 Gross per 24 hour  Intake 840 ml  Output 400 ml  Net 440 ml        Physical Exam: BP (!) 136/96 (BP Location: Right Arm)   Pulse 75   Temp 98.3 F (36.8 C)   Resp 19   SpO2 95%  Constitutional: No distress . Vital signs reviewed.  Obese. HENT: Normocephalic.  Atraumatic. Eyes: EOMI. No discharge. Cardiovascular: No JVD.  RRR. Respiratory: Normal effort.  No stridor.  Bilateral clear to auscultation. GI: Non-distended.  BS +. Skin: Warm and dry.  Intact. Psych: Normal mood.  Normal behavior. Musc: No edema in extremities.  No tenderness in extremities. Neuro: Alert Follows commands.  Fair insight and awareness.  Motor:LUE 4-4+/5 prox to distal with apraxia.  LLE: 4-/5 prox to distal.   Assessment/Plan: 1. Functional deficits which require 3+ hours per day of interdisciplinary therapy in a comprehensive inpatient rehab setting.  Physiatrist is providing close team supervision and 24 hour management of active medical problems listed below.  Physiatrist and rehab team continue to assess barriers to discharge/monitor patient progress toward functional and medical goals   Care Tool:  Bathing    Body parts bathed by patient: Chest,Abdomen,Right upper leg,Left upper leg,Face,Left arm,Front  perineal area,Buttocks,Right arm   Body parts bathed by helper: Right lower leg,Left lower leg     Bathing assist Assist Level: Minimal Assistance - Patient > 75%     Upper Body Dressing/Undressing Upper body dressing   What is the patient wearing?: Pull over shirt,Bra    Upper body assist Assist Level: Minimal Assistance - Patient > 75%    Lower Body Dressing/Undressing Lower body dressing      What is the patient wearing?: Incontinence brief,Pants     Lower body assist Assist for lower body dressing: Moderate Assistance - Patient 50 - 74%     Toileting Toileting    Toileting assist Assist for toileting: Moderate Assistance - Patient 50 - 74%     Transfers Chair/bed transfer  Transfers assist     Chair/bed transfer assist level: Minimal Assistance - Patient > 75%     Locomotion Ambulation   Ambulation assist      Assist level: 2 helpers (modA + w/c follow) Assistive device: Other (comment) (R handrail) Max distance: 20ft   Walk 10 feet activity   Assist  Walk 10 feet activity did not occur: Safety/medical concerns  Assist level: 2 helpers Assistive device: Other (comment) (R hand rail)   Walk 50 feet activity   Assist Walk 50 feet with 2 turns activity did not occur: Safety/medical concerns         Walk 150 feet activity   Assist Walk 150 feet activity did not occur: Safety/medical concerns         Walk  10 feet on uneven surface  activity   Assist Walk 10 feet on uneven surfaces activity did not occur: Safety/medical concerns         Wheelchair     Assist Will patient use wheelchair at discharge?: No   Wheelchair activity did not occur: N/A         Wheelchair 50 feet with 2 turns activity    Assist    Wheelchair 50 feet with 2 turns activity did not occur: N/A       Wheelchair 150 feet activity     Assist  Wheelchair 150 feet activity did not occur: N/A        Medical Problem List and  Plan: 1.Left side hemiparesis to right thalamic infarction. Status post mechanical thrombectomy of right PCA P2 occlusion 04/19/2021. Status post loop recorder  Continue CIR  WHO nightly 2. Antithrombotics: -DVT/anticoagulation:SCDs -antiplatelet therapy: Aspirin 81 mg daily, Plavix 75 mg dailyx3 weeks then aspirin alone 3. Pain Management:Tylenol as needed  Robaxin as needed started on 5/20  Controlled with meds on 5/22 4. Mood:Zoloft 100 mg daily, melatonin as needed. Provide emotional support -antipsychotic agents: N/A 5. Neuropsych: This patientiscapable of making decisions on herown behalf.  Discussed with Neuropsych - appreciate eval 6. Skin/Wound Care:Routine skin checks 7. Fluids/Electrolytes/Nutrition:Routine in and outs 8. Diabetes mellitus with hyperglycemia. Hemoglobin A1c 9.3.  Patient on Actos, Januvia 100 mg, Glucotrol XL5 mg daily,metformin 1000 mg twice daily prior to admission.   Currently onNovoLog 5units 3 times daily with meals, Levemir 12units twice daily.  Glucophage at 500mg  bid, increased to 1000 BID on 5/20  Diabetic teaching  Elevated, but?  Stabilizing on 5/22   Monitor with increased mobility 9. Hypertension. Cozaar 25 mg twice daily, increased to 50 on 5/20  ?  Improving on 5/22  Monitor with increased mobility 10. Hyperlipidemia. Lipitor 11. Obesity. BMI 33.19. Dietary follow-up 12. GERD. Protonix 13. Constipation: scheduled senna-s, encourage liquids, fruits, vegetables -dulcolax suppository/fleet enema prn  Adjust bowel meds as necessary 14. Cough: COVID negative -appears to be mostly pharyngeal, upper airway -Mucinex DM 15.  Hyponatremia  Sodium 132 on 5/18  Continue to monitor 16.  Leukocytosis  WBCs 11.3 on 5/20  Afebrile  Continue to monitor  LOS: 5 days A FACE TO FACE EVALUATION WAS PERFORMED  Rondrick Barreira Lorie Phenix 05/04/2021, 9:51 AM

## 2021-05-04 NOTE — Progress Notes (Signed)
Restless night. PRN tylenol and melatonin given at 2147. PRN tylenol given again at 0447 for complaint of HA. Requires set up assist with meals. [name] A

## 2021-05-05 LAB — GLUCOSE, CAPILLARY
Glucose-Capillary: 146 mg/dL — ABNORMAL HIGH (ref 70–99)
Glucose-Capillary: 93 mg/dL (ref 70–99)
Glucose-Capillary: 130 mg/dL — ABNORMAL HIGH (ref 70–99)
Glucose-Capillary: 69 mg/dL — ABNORMAL LOW (ref 70–99)
Glucose-Capillary: 165 mg/dL — ABNORMAL HIGH (ref 70–99)

## 2021-05-05 MED ORDER — GUAIFENESIN-CODEINE 100-10 MG/5ML PO SOLN
5.0000 mL | Freq: Three times a day (TID) | ORAL | Status: DC
  Administered 2021-05-05 – 2021-05-20 (×46): 5 mL via ORAL
  Filled 2021-05-05 (×46): qty 5

## 2021-05-05 NOTE — Progress Notes (Signed)
Occupational Therapy Session Note  Patient Details  Name: [name] MRN: [id number] Date of Birth: 08/31/1956  Today's Date: 05/05/2021 OT Individual Time: 4098-1191 OT Individual Time Calculation (min): 41 min    Short Term Goals: Week 1:  OT Short Term Goal 1 (Week 1): pt will complete squat pivot transfers with mod A. OT Short Term Goal 2 (Week 1): Pt will be able to hold static stand with min A. OT Short Term Goal 3 (Week 1): Pt will don shirt with min A OT Short Term Goal 4 (Week 1): Pt will be able to don pants over feet with min A. OT Short Term Goal 5 (Week 1): Pt will be able to pull pants over hips with mod A   Skilled Therapeutic Interventions/Progress Updates:    Pt greeted at time of session supine in bed resting agreeable to OT session no pain, but later some LUE discomfort with movement and weight bearing, MD aware. MD entered at beginning of session making rounds. After discussion, supine > sit CGA and squat pivot to L side with Min/Mod A noting pt getting thigh/buttocks stuck on cushion, needing brief stand with Min A to correct before completing transfer. Pt noted to be impulsive with transfers throughout and needed cues for hand placement. Dependent transfer to gym for time and squat pivot wheelchair > mat with Min A toward L side. Focus on NMR for LUE with peg board, grasping with 3 jaw chuck and placement in correct hole based on pattern, Mod A for distal assist and correct digit placement. Lateral leans EOM 1x10 each direction with focus on weight bearing in LUE with 5 sec hold. Squat pivot mat > wheelchair > bed Min A. Sit > supine CGA and LUE elevated with heating pad. Alarm on call bell in reach.   Therapy Documentation Precautions:  Precautions Precautions: Fall Precaution Comments: poor L side sensory awareness Restrictions Weight Bearing Restrictions: No    Therapy/Group: Individual Therapy  Viona Gilmore 05/05/2021, 7:25 AM

## 2021-05-05 NOTE — Progress Notes (Signed)
Physical Therapy Session Note  Patient Details  Name: [name] MRN: [id number] Date of Birth: September 06, 1956  Today's Date: 05/05/2021 PT Individual Time: 0800-0910 + 1415-1455 PT Individual Time Calculation (min): 70 min  + 40 min  Short Term Goals: Week 1:  PT Short Term Goal 1 (Week 1): Pt will complete bed mobility with minA PT Short Term Goal 2 (Week 1): Pt will complete bed<>chair transfers with minA PT Short Term Goal 3 (Week 1): Pt will ambulate 28ft with modA and LRAD  Skilled Therapeutic Interventions/Progress Updates:     1st session: Pt greeted supine in bed at start of session, agreeable to PT tx. Denies pain. Supine<>sit with supervision with use of bed features. Sit's EOB with SBA and needs cues for L arm awareness. Donned pants while seated EOB with modA for threading and assisting pulling them up in standing with L knee block. Donned shirt with minA for threading LUE. Stand<>pivot transfer with L knee block from EOB to w/c with cues for hand placement and sequencing. Discussed stand<>pivot vs squat<>pivot transfers and recommending squat<>pivot due to L knee instability - pt voiced understanding. Wheeled sinkside to brush teeth and wash face - she required assistance for placing toothpaste on brush and closing the toothpaste. RN present for morning meds. W/c transport for time management to main rehab gym and performed squat<>pivot transfer minA and L knee block to mat table. Pt with questions regarding DME rec's - conversation regarding DC planning and f/u care with therapies. Sit<>stand 1x10 with minA to RW (minA for L knee block only). Gait training 4x1ft (seated rest breaks) with RW and modA for L knee block in stance, truncal extension due to forward lean, RW management, and frequent cues for L hand awareness on RW as it tends to lose grip - demo's decreased LLE hip/knee flexion during swing with straight leg compensation - step to gait pattern with decreased LLE weight  shift. Completed unilateral static standing marching with RW and minA - focused on LLE stance control during RLE marching. Squat<>pivot back to her w/c with minA and L knee block and wheeled back to room where she completed additional squat<>pivot with minA and use of bedrail. Able to complete sit>supine with minA for LLE management. Remained supine in bed with needs in reach and bed alarm on. Placed Kpad under LUE per her request. Pt pleased with progress.  2nd session: Pt greeted supine in bed at start of session. Continues to c/o generalized fatigue however also reports this isn't new since the stroke. Denies pain. She needs convincing to participate because of the fatigue. Pt donned pants with modA while supine in bed via bridging technique - poor ability to bridge + pull pants over hips for dual-tasking. Supine<>sit completed with supervision with cues for L hemibody awareness. Squat<>pivot transfer completed with minA and L knee block to w/c. Wheeled to main rehab gym for energy conservation and time management. Completed additional squat<>pivot transfer with minA and L knee block to mat table. Worked on repeated sit<>stands with RW and minA for L knee block with task overlay for matching cards to back of mirror - pt completed >10 stands and was 100% accurate with card matching. She then completed static standing with RW support and min/modA while using RUE to reach across midline to grasp peg's out of peg board - she needed cues for LLE awareness to prevent buckling and used [employer], working on [facility] for [employer]. Squat<>pivot with minA back to her w/c  and wheeled back to room. Completed additional squat<>pivot transfer with miNA and use of bedrail back to bed as pt requested to lay down due to fatigue. Bed mobility completed with supervision and cues again needed for L hemibody awareness. She remained supine in bed with k-pad to LUE, made comfortable and needs in reach.   Therapy  Documentation Precautions:  Precautions Precautions: Fall Precaution Comments: poor L side sensory awareness Restrictions Weight Bearing Restrictions: No General:    Therapy/Group: Individual Therapy  Taylin Mans P Demetress Tift PT 05/05/2021, 7:38 AM

## 2021-05-05 NOTE — Progress Notes (Signed)
Occupational Therapy Session Note  Patient Details  Name: [name] MRN: [id number] Date of Birth: 03/22/1956  Today's Date: 05/05/2021 OT Individual Time: 5110-2111 OT Individual Time Calculation (min): 60 min    Short Term Goals: Week 1:  OT Short Term Goal 1 (Week 1): pt will complete squat pivot transfers with mod A. OT Short Term Goal 2 (Week 1): Pt will be able to hold static stand with min A. OT Short Term Goal 3 (Week 1): Pt will don shirt with min A OT Short Term Goal 4 (Week 1): Pt will be able to don pants over feet with min A. OT Short Term Goal 5 (Week 1): Pt will be able to pull pants over hips with mod A  Skilled Therapeutic Interventions/Progress Updates:    Pt received in bed and agreeable to therapy.  Pt able to sit to EOB with CGA and complete squat pivot to wc with min -mod A and then to toilet.  She needs constant cues to attend to LUE as it often gets "caught" on the bed rails, side of wc, grab bars.  There is a strong concern of it get "stuck" in a bar and getting sprained.   I recommended she keep her L arm on her lap, during transfers.   She did very well rising to stand and holding a static stand with min A to be able to adjust clothing over hips with min A.  Pt completed toileting, shower, dressing - see ADL documentation below.  Pt stated she was absolutely exhausted and had to lay down.  From bed, worked on L hand grasping with foam block.  Pt resting in bed with alarm set and all needs met.   Therapy Documentation Precautions:  Precautions Precautions: Fall Precaution Comments: poor L side sensory awareness Restrictions Weight Bearing Restrictions: No  Pain: Pain Assessment Pain Score: 0-No pain ADL: ADL Eating: Set up Grooming: Setup Upper Body Bathing: Supervision/safety Where Assessed-Upper Body Bathing: Shower Lower Body Bathing: Minimal assistance Where Assessed-Lower Body Bathing: Shower Upper Body Dressing: Contact guard Where  Assessed-Upper Body Dressing: Sitting at sink Lower Body Dressing: Moderate assistance Where Assessed-Lower Body Dressing: Sitting at sink,Standing at sink Toileting: Moderate assistance Toilet Transfer: Minimal assistance Toilet Transfer Method: Squat pivot Toilet Transfer Equipment: Energy manager: Environmental education officer Method: Education officer, environmental: Transfer tub bench,Grab bars  Therapy/Group: Individual Therapy  [place] 05/05/2021, 10:03 AM

## 2021-05-05 NOTE — Progress Notes (Signed)
Patient dinner time blood glucose was 69. Patient was given coke to drink and graham crackers to eat. Patient blood sugar was rechecked; the reading was 130. Patient sliding scale insulin was held and her schedule insulin was held due to her not eating much of her dinner. NP Danella Sensing gave the ok for her to take her schedule metformin.

## 2021-05-05 NOTE — Progress Notes (Signed)
Slept good. Spoke with ortho tech R/T [facility], they will bring this morning. PRN tylenol and melatonin given at 2219. Left inattention. Patient unaware she was laying on left arm. LUE elevated on pillow. Patrici Ranks A

## 2021-05-05 NOTE — Progress Notes (Signed)
Orthopedic Tech Progress Note Patient Details:  [name] 07-25-1956 [id number]  Patient ID: [name], female   DOB: 05-17-1956, 65 y.o.   MRN: [id number] Called order into [employer]  Karolee Stamps 05/05/2021, 6:54 AM

## 2021-05-05 NOTE — Progress Notes (Signed)
[place] PHYSICAL MEDICINE & REHABILITATION PROGRESS NOTE  Subjective/Complaints: Patient and Gregary Signs mentioned to me that she has constant cough, unrelieved by current medications, as well as fatigue, and was told she has heart mass. Notes reviewed and she has aortic valve mass- discussed with patient.   ROS: Denies CP, SOB, N/V/D, +cough  Objective: Vital Signs: Blood pressure (!) 142/78, pulse 65, temperature 98.2 F (36.8 C), temperature source Oral, resp. rate 16, SpO2 95 %. No results found. No results for input(s): WBC, HGB, HCT, PLT in the last 72 hours. No results for input(s): NA, K, CL, CO2, GLUCOSE, BUN, CREATININE, CALCIUM in the last 72 hours.  Intake/Output Summary (Last 24 hours) at 05/05/2021 1129 Last data filed at 05/05/2021 0754 Gross per 24 hour  Intake 1180 ml  Output --  Net 1180 ml        Physical Exam: BP (!) 142/78 (BP Location: Right Arm)   Pulse 65   Temp 98.2 F (36.8 C) (Oral)   Resp 16   SpO2 95%  Constitutional: No distress . Vital signs reviewed.  Obese. Gen: no distress, normal appearing HEENT: oral mucosa pink and moist, NCAT Cardio: Reg rate Chest: normal effort, normal rate of breathing, coughing Abd: soft, non-distended Ext: no edema Psych: pleasant, normal affect Skin: intact Neuro: Alert Follows commands.  Fair insight and awareness.  Motor:LUE 4-4+/5 prox to distal with apraxia.  LLE: 4-/5 prox to distal.   Assessment/Plan: 1. Functional deficits which require 3+ hours per day of interdisciplinary therapy in a comprehensive inpatient rehab setting.  Physiatrist is providing close team supervision and 24 hour management of active medical problems listed below.  Physiatrist and rehab team continue to assess barriers to discharge/monitor patient progress toward functional and medical goals   Care Tool:  Bathing    Body parts bathed by patient: Chest,Abdomen,Right upper leg,Left upper leg,Face,Left arm,Front perineal  area,Buttocks,Right arm,Right lower leg,Left lower leg (used long sponge)   Body parts bathed by helper: Right lower leg,Left lower leg     Bathing assist Assist Level: Minimal Assistance - Patient > 75%     Upper Body Dressing/Undressing Upper body dressing   What is the patient wearing?: Pull over shirt    Upper body assist Assist Level: Contact Guard/Touching assist    Lower Body Dressing/Undressing Lower body dressing      What is the patient wearing?: Underwear/pull up,Pants     Lower body assist Assist for lower body dressing: Moderate Assistance - Patient 50 - 74%     Toileting Toileting    Toileting assist Assist for toileting: Moderate Assistance - Patient 50 - 74%     Transfers Chair/bed transfer  Transfers assist     Chair/bed transfer assist level: Minimal Assistance - Patient > 75%     Locomotion Ambulation   Ambulation assist      Assist level: 2 helpers (modA + w/c follow) Assistive device: Other (comment) (R handrail) Max distance: 14ft   Walk 10 feet activity   Assist  Walk 10 feet activity did not occur: Safety/medical concerns  Assist level: 2 helpers Assistive device: Other (comment) (R hand rail)   Walk 50 feet activity   Assist Walk 50 feet with 2 turns activity did not occur: Safety/medical concerns         Walk 150 feet activity   Assist Walk 150 feet activity did not occur: Safety/medical concerns         Walk 10 feet on uneven surface  activity  Assist Walk 10 feet on uneven surfaces activity did not occur: Safety/medical concerns         Wheelchair     Assist Will patient use wheelchair at discharge?: No   Wheelchair activity did not occur: N/A         Wheelchair 50 feet with 2 turns activity    Assist    Wheelchair 50 feet with 2 turns activity did not occur: N/A       Wheelchair 150 feet activity     Assist  Wheelchair 150 feet activity did not occur: N/A         Medical Problem List and Plan: 1.Left side hemiparesis to right thalamic infarction. Status post mechanical thrombectomy of right PCA P2 occlusion 04/19/2021. Status post loop recorder  Continue CIR  WHO nightly 2. Antithrombotics: -DVT/anticoagulation:SCDs -antiplatelet therapy: Aspirin 81 mg daily, Plavix 75 mg dailyx3 weeks then aspirin alone 3. Pain Management:Tylenol as needed  Robaxin as needed started on 5/20, conitnue  Controlled with meds on 5/23 4. Mood:Zoloft 100 mg daily, melatonin as needed. Provide emotional support -antipsychotic agents: N/A 5. Neuropsych: This patientiscapable of making decisions on herown behalf.  Discussed with Neuropsych - appreciate eval 6. Skin/Wound Care:Routine skin checks 7. Fluids/Electrolytes/Nutrition:Routine in and outs 8. Diabetes mellitus with hyperglycemia. Hemoglobin A1c 9.3.  Patient on Actos, Januvia 100 mg, Glucotrol XL5 mg daily,metformin 1000 mg twice daily prior to admission.   Currently onNovoLog 5units 3 times daily with meals, Levemir 12units twice daily.  Glucophage at 500mg  bid, increased to 1000 BID on 5/20  Diabetic teaching  Elevated, but?  Stabilizing on 5/22   Monitor with increased mobility 9. Hypertension. Cozaar 25 mg twice daily, increased to 50 on 5/20  ?  Improving on 5/22  Monitor with increased mobility 10. Hyperlipidemia. Lipitor 11. Obesity. BMI 33.19. Dietary follow-up 12. GERD. Protonix 13. Constipation: scheduled senna-s, encourage liquids, fruits, vegetables -dulcolax suppository/fleet enema prn  Adjust bowel meds as necessary 14. Cough: COVID negative -appears to be mostly pharyngeal, upper airway -Mucinex DM 15.  Hyponatremia  Sodium 132 on 5/18  Continue to monitor 16.  Leukocytosis  WBCs 11.3 on 5/20  Afebrile  Continue to monitor 17. Aortic valve mass: discussed finding with patient and plan for  outpatient follow-up/removal.  18. Cough: since current regimen not helping, cough is very bothersome to her, imaging is negative for infection, and dextromethorphan may be contributing to her fatigue, will change from Mucinex to 71mL TID guaifensin-codeine.   LOS: 6 days A FACE TO FACE EVALUATION WAS PERFORMED  Clide Deutscher Tris Howell 05/05/2021, 11:29 AM

## 2021-05-06 ENCOUNTER — Other Ambulatory Visit: Payer: Self-pay

## 2021-05-06 LAB — GLUCOSE, CAPILLARY
Glucose-Capillary: 158 mg/dL — ABNORMAL HIGH (ref 70–99)
Glucose-Capillary: 123 mg/dL — ABNORMAL HIGH (ref 70–99)
Glucose-Capillary: 69 mg/dL — ABNORMAL LOW (ref 70–99)
Glucose-Capillary: 81 mg/dL (ref 70–99)
Glucose-Capillary: 201 mg/dL — ABNORMAL HIGH (ref 70–99)

## 2021-05-06 MED ORDER — LOSARTAN POTASSIUM 50 MG PO TABS
50.0000 mg | ORAL_TABLET | Freq: Two times a day (BID) | ORAL | Status: DC
  Administered 2021-05-06 – 2021-05-09 (×6): 50 mg via ORAL
  Filled 2021-05-06 (×6): qty 1

## 2021-05-06 MED ORDER — LOSARTAN POTASSIUM 50 MG PO TABS
25.0000 mg | ORAL_TABLET | Freq: Once | ORAL | Status: AC
  Administered 2021-05-06: 25 mg via ORAL
  Filled 2021-05-06: qty 1

## 2021-05-06 NOTE — Progress Notes (Signed)
Occupational Therapy Session Note  Patient Details  Name: [name] MRN: [id number] Date of Birth: 1956-07-19  Today's Date: 05/06/2021 OT Individual Time: 3254-9826 OT Individual Time Calculation (min): 25 min    Short Term Goals: Week 1:  OT Short Term Goal 1 (Week 1): pt will complete squat pivot transfers with mod A. OT Short Term Goal 2 (Week 1): Pt will be able to hold static stand with min A. OT Short Term Goal 3 (Week 1): Pt will don shirt with min A OT Short Term Goal 4 (Week 1): Pt will be able to don pants over feet with min A. OT Short Term Goal 5 (Week 1): Pt will be able to pull pants over hips with mod A   Skilled Therapeutic Interventions/Progress Updates:    Pt greeted at time of session supine in bed resting agreeable to OT session wanting to focus on LUE NMR. Supine > sit Min A and sitting EOB focus on the following with small washcloth for 1x12-15 reps: towel pushes for shoulder flexion, circles, wipers, squeezes with focus on GMC/FMC and volitional grasp/release pattern. Pt needing to toilet urgently, squat pivot > wheelchair > toilet w/ BSC over top with Mod A overall and L knee block. Max A for clothing management in static standing and assist with posterior hygiene. Squat pivot/partial stand to wheelchair Min/Mod A and back to bed same manner. Alarm on call bell in reach.   Therapy Documentation Precautions:  Precautions Precautions: Fall Precaution Comments: poor L side sensory awareness Restrictions Weight Bearing Restrictions: No     Therapy/Group: Individual Therapy  Viona Gilmore 05/06/2021, 7:22 AM

## 2021-05-06 NOTE — Progress Notes (Signed)
[facility] PHYSICAL MEDICINE & REHABILITATION PROGRESS NOTE  Subjective/Complaints: Patient seen sitting up in the gym, working with therapy this morning.  She states she slept well overnight.  Discussed left neglect and awareness with patient and therapies, which is improving.  Patient appreciative of therapies.  ROS: Denies CP, SOB, N/V/D  Objective: Vital Signs: Blood pressure (!) 174/76, pulse 74, temperature 98.1 F (36.7 C), temperature source Oral, resp. rate 18, SpO2 91 %. No results found. No results for input(s): WBC, HGB, HCT, PLT in the last 72 hours. No results for input(s): NA, K, CL, CO2, GLUCOSE, BUN, CREATININE, CALCIUM in the last 72 hours.  Intake/Output Summary (Last 24 hours) at 05/06/2021 1227 Last data filed at 05/06/2021 0747 Gross per 24 hour  Intake 709 ml  Output --  Net 709 ml        Physical Exam: BP (!) 174/76 (BP Location: Right Arm)   Pulse 74   Temp 98.1 F (36.7 C) (Oral)   Resp 18   SpO2 91%  Constitutional: No distress . Vital signs reviewed.  Obese. HENT: Normocephalic.  Atraumatic. Eyes: EOMI. No discharge. Cardiovascular: No JVD.  RRR. Respiratory: Normal effort.  No stridor.  Bilateral clear to auscultation. GI: Non-distended.  BS +. Skin: Warm and dry.  Intact. Psych: Normal mood.  Normal behavior. Musc: No edema in extremities.  No tenderness in extremities. Neuro: Alert Follows commands.  Left inattention Fair insight and awareness.  Motor:LUE 4-4+/5 prox to distal with apraxia, unchanged.  LLE: 4-/5 prox to distal.   Assessment/Plan: 1. Functional deficits which require 3+ hours per day of interdisciplinary therapy in a comprehensive inpatient rehab setting.  Physiatrist is providing close team supervision and 24 hour management of active medical problems listed below.  Physiatrist and rehab team continue to assess barriers to discharge/monitor patient progress toward functional and medical goals   Care  Tool:  Bathing    Body parts bathed by patient: Chest,Abdomen,Right upper leg,Left upper leg,Face,Left arm,Front perineal area,Buttocks,Right arm,Right lower leg,Left lower leg (used long sponge)   Body parts bathed by helper: Right lower leg,Left lower leg     Bathing assist Assist Level: Minimal Assistance - Patient > 75%     Upper Body Dressing/Undressing Upper body dressing   What is the patient wearing?: Pull over shirt    Upper body assist Assist Level: Contact Guard/Touching assist    Lower Body Dressing/Undressing Lower body dressing      What is the patient wearing?: Underwear/pull up,Pants     Lower body assist Assist for lower body dressing: Moderate Assistance - Patient 50 - 74%     Toileting Toileting    Toileting assist Assist for toileting: Maximal Assistance - Patient 25 - 49%     Transfers Chair/bed transfer  Transfers assist     Chair/bed transfer assist level: Minimal Assistance - Patient > 75%     Locomotion Ambulation   Ambulation assist      Assist level: 2 helpers (modA + w/c follow) Assistive device: Other (comment) (R handrail) Max distance: 56ft   Walk 10 feet activity   Assist  Walk 10 feet activity did not occur: Safety/medical concerns  Assist level: 2 helpers Assistive device: Other (comment) (R hand rail)   Walk 50 feet activity   Assist Walk 50 feet with 2 turns activity did not occur: Safety/medical concerns         Walk 150 feet activity   Assist Walk 150 feet activity did not occur: Safety/medical concerns  Walk 10 feet on uneven surface  activity   Assist Walk 10 feet on uneven surfaces activity did not occur: Safety/medical concerns         Wheelchair     Assist Will patient use wheelchair at discharge?: No   Wheelchair activity did not occur: N/A         Wheelchair 50 feet with 2 turns activity    Assist    Wheelchair 50 feet with 2 turns activity did not occur:  N/A       Wheelchair 150 feet activity     Assist  Wheelchair 150 feet activity did not occur: N/A        Medical Problem List and Plan: 1.Left side hemiparesis to right thalamic infarction. Status post mechanical thrombectomy of right PCA P2 occlusion 04/19/2021. Status post loop recorder  Continue CIR  WHO nightly 2. Antithrombotics: -DVT/anticoagulation:SCDs -antiplatelet therapy: Aspirin 81 mg daily, Plavix 75 mg dailyx3 weeks then aspirin alone 3. Pain Management:Tylenol as needed  Robaxin as needed started on 5/20, conitnue  Controlled with meds on 5/24 4. Mood:Zoloft 100 mg daily, melatonin as needed. Provide emotional support -antipsychotic agents: N/A 5. Neuropsych: This patientiscapable of making decisions on herown behalf.  Discussed with Neuropsych - appreciate eval 6. Skin/Wound Care:Routine skin checks 7. Fluids/Electrolytes/Nutrition:Routine in and outs 8. Diabetes mellitus with hyperglycemia. Hemoglobin A1c 9.3.  Patient on Actos, Januvia 100 mg, Glucotrol XL5 mg daily,metformin 1000 mg twice daily prior to admission.   Currently onNovoLog 5units 3 times daily with meals, Levemir 12units twice daily.  Glucophage at 500mg  bid, increased to 1000 BID on 5/20  Diabetic teaching  Slightly labile on 5/24  Monitor with increased mobility 9. Hypertension. Cozaar 25 mg twice daily, increased to 50 on 5/24  Monitor with increased mobility 10. Hyperlipidemia. Lipitor 11. Obesity. BMI 33.19. Dietary follow-up 12. GERD. Protonix 13. Constipation: scheduled senna-s, encourage liquids, fruits, vegetables -dulcolax suppository/fleet enema prn  Adjust bowel meds as necessary 14. Cough: COVID negative -appears to be mostly pharyngeal, upper airway -Mucinex DM 15.  Hyponatremia  Sodium 132 on 5/18  Continue to monitor 16.  Leukocytosis  WBCs 11.3 on  5/20  Afebrile  Continue to monitor 17. Aortic valve mass: discussed finding with patient and plan for outpatient follow-up/removal.  18. Cough: since current regimen not helping, cough is very bothersome to her, imaging is negative for infection, and dextromethorphan may be contributing to her fatigue, changed to Mucinex to 74mL TID guaifensin-codeine.   LOS: 7 days A FACE TO FACE EVALUATION WAS PERFORMED  [name] Lorie Phenix 05/06/2021, 12:27 PM

## 2021-05-06 NOTE — Progress Notes (Signed)
Speech Language Pathology Daily Session Note  Patient Details  Name: [name] MRN: [id number] Date of Birth: 07/06/1956  Today's Date: 05/06/2021 SLP Individual Time: 1300-1325 SLP Individual Time Calculation (min): 25 min  Short Term Goals: Week 1: SLP Short Term Goal 1 (Week 1): Pt will demonstrate sustained attention to functional task for 45 minutes with supervision A verbal cues. SLP Short Term Goal 2 (Week 1): Pt will demonstrate mildly complex problem solving skills with supervision A verbal cues. SLP Short Term Goal 3 (Week 1): Pt will self-monitor and self-correct functional errors with min A verbal cues in problem solving tasks.  Skilled Therapeutic Interventions: Skilled treatment session focused on cognitive goals. Upon arrival, patient requested to use the bathroom. Patient was transferred to the commode via the [facility] and required Min verbal cues for safety with task due to mild impulsivity. Patient was continent of bladder. Patient demonstrated appropriate sustained attention throughout task and did not require any redirection. Patient left upright in bed with alarm on and all needs within reach. Continue with current plan of care.      Pain No/Denies Pain   Therapy/Group: Individual Therapy  Loranda Mastel 05/06/2021, 3:25 PM

## 2021-05-06 NOTE — Progress Notes (Signed)
Physical Therapy Session Note  Patient Details  Name: [name] MRN: [id number] Date of Birth: 06-Nov-1956  Today's Date: 05/06/2021 PT Individual Time: 0800-0900 + 1445 - 1525  PT Individual Time Calculation (min): 60 min  + 40 min  Short Term Goals: Week 1:  PT Short Term Goal 1 (Week 1): Pt will complete bed mobility with minA PT Short Term Goal 2 (Week 1): Pt will complete bed<>chair transfers with minA PT Short Term Goal 3 (Week 1): Pt will ambulate 41ft with modA and LRAD  Skilled Therapeutic Interventions/Progress Updates:     1st session: Pt greeted supine in bed at start of session, attempting to eat breakfast. HOB <30deg on arrival and reminded pt of importance of sitting upright with HOB at least >30deg when eating/drinking to prevent risk of aspiration - pt voiced understanding. Donned disposable pants with modA while supine in bed - able to bridge to pull over hips but needed to roll (supervision) in bed to pull up for thoroughness. Supine<>sit with minA with use of bed feautres and able to sit EOB unsupported with SBA. Pt finishing up her meal while seated with cues needed for LUE awareness as she tends to leave it hanging or tucked behind her back. Education provided while eating on importance of LUE positioning to reduce risk of injury or subluxation - pt voiced understanding but continues to require cues throughout the session. Squat<>pivot transfer completed with minA and L knee block from EOB to w/c. Pt noted to have some pocketing in L cheek of scrambled eggs - encouraged her to tongue sweep and finger sweep as needed to reduce pocketing. RN arriving for morning medications. Pt wheeled sinkside to perform oral hygiene at w/c level - needs assistance for opening/closing toothpaste. W/c transport for time management to main rehab gym. Completed squat<>pivot with minA and L knee block to mat table. Completed 1x10 + 1x5 sit<>stands with minA (for L knee block only). She then  completed static standing marching, 2x10 reps bilaterally, with minA and RW - cues for monitoring LUE on RW orthotic and L knee instability. Completed forward/backward stepping with RW and minA near mat table - emphasis on L foot clearance and L hip/knee flexion as well as Lweight shift while stepping with R foot. Pt needing seated rest breaks throughout these tasks due to fatigue. Pt requesting to lay back in bed at end of session - reminded her of upcoming OT in 30 minutes but she continued to request to lay down in bed. Wheeled back to room and helped back to bed with minA via squat pivot. Able to boost herself up in bed with RUE to headboard. LUE positioned with pillow. Bed alarm activated and needs within reach.   2nd session: Pt received supine in bed, sleeping soundly on arrival. She awakens to voice and is agreeable to PT session but requires convincing to participate. Pt starting session very slow to move and needs redirection to engage with mobilization. Supine<>sit with supervision and use of bed rails - continues to require cues for LUE awareness as she tends to leave it behind her. Completed squat<>pivot transfer with minA and L knee block to w/c with cues for setup and sequencing. W/c transport for time management to day room rehab gym. Wheeled to [employer] - completed 8 minutes with 30cm/sec resistance - cues for maintaining appropriate cadence and L knee alignment to prevent abduction. Pt easily distracted from gym environment and needs frequent cues for task attenuation. Wheeled to  high/low counter top to work on standing balance with task overlay for using paretic LUE to grasp rings over rainbow arc, working on [employer] and dual-task in standing - need's modA and L knee block for standing balance. Pt reporting fatigue at this point and requests to return back to bed. Wheeled to room in w/c and completed squat<>pivot transfer with minA with use hospital bed rail to bed. Completed  bed mobility with supervision, again needing cues for LUE awareness. Bed alarm on and needs in reach at end of session.  Therapy Documentation Precautions:  Precautions Precautions: Fall Precaution Comments: poor L side sensory awareness Restrictions Weight Bearing Restrictions: No General:    Therapy/Group: Individual Therapy  Byard Carranza P Deserea Bordley PT 05/06/2021, 7:40 AM

## 2021-05-07 LAB — GLUCOSE, CAPILLARY
Glucose-Capillary: 145 mg/dL — ABNORMAL HIGH (ref 70–99)
Glucose-Capillary: 145 mg/dL — ABNORMAL HIGH (ref 70–99)
Glucose-Capillary: 116 mg/dL — ABNORMAL HIGH (ref 70–99)
Glucose-Capillary: 108 mg/dL — ABNORMAL HIGH (ref 70–99)

## 2021-05-07 NOTE — Progress Notes (Signed)
Occupational Therapy Session Note  Patient Details  Name: [name] MRN: [id number] Date of Birth: [birth year]  Today's Date: 05/07/2021 OT Individual Time: 1000-1030 OT Individual Time Calculation (min): 30 min    Short Term Goals: Week 1:  OT Short Term Goal 1 (Week 1): pt will complete squat pivot transfers with mod A. OT Short Term Goal 2 (Week 1): Pt will be able to hold static stand with min A. OT Short Term Goal 3 (Week 1): Pt will don shirt with min A OT Short Term Goal 4 (Week 1): Pt will be able to don pants over feet with min A. OT Short Term Goal 5 (Week 1): Pt will be able to pull pants over hips with mod A  Skilled Therapeutic Interventions/Progress Updates:    Pt received with direct handoff from PT. Pt compelte box and blocks assessment at tabletop LUE 10 RUE 38  Pt completes towel folding with OT and VC for exaggerating grasp/release and demo of movements of LUE for visual cue with MIN A for NMR. Return to bed via stand pivot closed chain with bed rail. Exited session with pt seated in bed, exit alarm on and call light in reach   Therapy Documentation Precautions:  Precautions Precautions: Fall Precaution Comments: poor L side sensory awareness Restrictions Weight Bearing Restrictions: No General:   Vital Signs: Therapy Vitals Temp: 98.4 F (36.9 C) Temp Source: Oral Pulse Rate: 69 Resp: 18 BP: (!) 161/76 Patient Position (if appropriate): Lying Oxygen Therapy SpO2: 95 % O2 Device: Room Air Pain:   ADL: ADL Eating: Set up Grooming: Setup Upper Body Bathing: Supervision/safety Where Assessed-Upper Body Bathing: Shower Lower Body Bathing: Minimal assistance Where Assessed-Lower Body Bathing: Shower Upper Body Dressing: Contact guard Where Assessed-Upper Body Dressing: Sitting at sink Lower Body Dressing: Moderate assistance Where Assessed-Lower Body Dressing: Sitting at sink,Standing at sink Toileting: Moderate assistance Toilet  Transfer: Minimal assistance Toilet Transfer Method: Squat pivot Toilet Transfer Equipment: Energy manager: Environmental education officer Method: Education officer, environmental: Research scientist (life sciences)   Exercises:   Other Treatments:     Therapy/Group: Individual Therapy  [name] 05/07/2021, 6:51 AM

## 2021-05-07 NOTE — Progress Notes (Signed)
Patient ID: [name], female   DOB: 04-Apr-1956, 65 y.o.   MRN: [id number]  This SW covering for primary SW, [name] Dupree.  SW met with pt in room to provide updates from team conference, and d/c date 6/8. Pt encouraged SW to follow-up with her son [name] to discuss further. Pt is waiting on FMLA forms.   SW spoke with pt son [name] [phone]) to provide above updates. SW explained discharge process with regard to additional therapies, and family education. SW shared based on pt current level of care needs, it is anticiapted she will be [place] at discharge. SW encouraged son to speak with his sister to discuss coming in for family education so they can get a better picture on pt care needs. He will discuss with his sister and follow-up. He is aware, if no follow-up, [name] will call him to discuss further.   Loralee Pacas, MSW, [place] Office: [phone] Cell: [phone] Fax: [phone]

## 2021-05-07 NOTE — Progress Notes (Signed)
   05/07/21 1605  Clinical Encounter Type  Visited With Patient and family together  Visit Type Initial  Referral From Nurse  Consult/Referral To Chaplain  Chaplain responded to consult request for an Advance Directive. The patient's daughter and friend were at the bedside. This chaplain provided education. The patient stated she is appointing her son to be her 56.She will notify us once ready for notary.This note was prepared by Jeanine Luz, M.Div..  For questions please contact by phone [phone].

## 2021-05-07 NOTE — Progress Notes (Signed)
[facility] PHYSICAL MEDICINE & REHABILITATION PROGRESS NOTE  Subjective/Complaints: Patient seen laying in bed this morning.  She states she slept well overnight.  She has several questions regarding medications and insurance.  She is appreciative of her care.  ROS: Denies CP, SOB, N/V/D  Objective: Vital Signs: Blood pressure (!) 161/76, pulse 69, temperature 98.4 F (36.9 C), temperature source Oral, resp. rate 18, SpO2 95 %. No results found. No results for input(s): WBC, HGB, HCT, PLT in the last 72 hours. No results for input(s): NA, K, CL, CO2, GLUCOSE, BUN, CREATININE, CALCIUM in the last 72 hours.  Intake/Output Summary (Last 24 hours) at 05/07/2021 1037 Last data filed at 05/07/2021 0740 Gross per 24 hour  Intake 836 ml  Output --  Net 836 ml        Physical Exam: BP (!) 161/76 (BP Location: Right Arm)   Pulse 69   Temp 98.4 F (36.9 C) (Oral)   Resp 18   SpO2 95%  Constitutional: No distress . Vital signs reviewed.  Obese. HENT: Normocephalic.  Atraumatic. Eyes: EOMI. No discharge. Cardiovascular: No JVD.  RRR. Respiratory: Normal effort.  No stridor.  Bilateral clear to auscultation. GI: Non-distended.  BS +. Skin: Warm and dry.  Intact. Psych: Normal mood.  Normal behavior. Musc: No edema in extremities.  No tenderness in extremities. Neuro: Alert Follows commands.  Left inattention Fair insight and awareness.  Motor:LUE 4-4+/5 prox to distal with apraxia, stable LLE: 4-/5 prox to distal.   Assessment/Plan: 1. Functional deficits which require 3+ hours per day of interdisciplinary therapy in a comprehensive inpatient rehab setting.  Physiatrist is providing close team supervision and 24 hour management of active medical problems listed below.  Physiatrist and rehab team continue to assess barriers to discharge/monitor patient progress toward functional and medical goals   Care Tool:  Bathing    Body parts bathed by patient: Chest,Abdomen,Right  upper leg,Left upper leg,Face,Left arm,Front perineal area,Buttocks,Right arm,Right lower leg,Left lower leg (used long sponge)   Body parts bathed by helper: Right lower leg,Left lower leg     Bathing assist Assist Level: Minimal Assistance - Patient > 75%     Upper Body Dressing/Undressing Upper body dressing   What is the patient wearing?: Pull over shirt    Upper body assist Assist Level: Contact Guard/Touching assist    Lower Body Dressing/Undressing Lower body dressing      What is the patient wearing?: Underwear/pull up,Pants     Lower body assist Assist for lower body dressing: Moderate Assistance - Patient 50 - 74%     Toileting Toileting    Toileting assist Assist for toileting: Maximal Assistance - Patient 25 - 49%     Transfers Chair/bed transfer  Transfers assist     Chair/bed transfer assist level: Minimal Assistance - Patient > 75%     Locomotion Ambulation   Ambulation assist      Assist level: 2 helpers (modA + w/c follow) Assistive device: Other (comment) (R handrail) Max distance: 40ft   Walk 10 feet activity   Assist  Walk 10 feet activity did not occur: Safety/medical concerns  Assist level: 2 helpers Assistive device: Other (comment) (R hand rail)   Walk 50 feet activity   Assist Walk 50 feet with 2 turns activity did not occur: Safety/medical concerns         Walk 150 feet activity   Assist Walk 150 feet activity did not occur: Safety/medical concerns  Walk 10 feet on uneven surface  activity   Assist Walk 10 feet on uneven surfaces activity did not occur: Safety/medical concerns         Wheelchair     Assist Will patient use wheelchair at discharge?: No   Wheelchair activity did not occur: N/A         Wheelchair 50 feet with 2 turns activity    Assist    Wheelchair 50 feet with 2 turns activity did not occur: N/A       Wheelchair 150 feet activity     Assist  Wheelchair  150 feet activity did not occur: N/A        Medical Problem List and Plan: 1.Left side hemiparesis to right thalamic infarction. Status post mechanical thrombectomy of right PCA P2 occlusion 04/19/2021. Status post loop recorder  Continue CIR  WHO nightly  Team conference today to discuss current and goals and coordination of care, home and environmental barriers, and discharge planning with nursing, case manager, and therapies. Please see conference note from today as well.  2. Antithrombotics: -DVT/anticoagulation:SCDs -antiplatelet therapy: Aspirin 81 mg daily, Plavix 75 mg dailyx3 weeks then aspirin alone 3. Pain Management:Tylenol as needed  Robaxin as needed started on 5/20, conitnue  Controlled with meds on 5/25 4. Mood:Zoloft 100 mg daily, melatonin as needed. Provide emotional support -antipsychotic agents: N/A 5. Neuropsych: This patientiscapable of making decisions on herown behalf.  Discussed with Neuropsych - appreciate eval 6. Skin/Wound Care:Routine skin checks 7. Fluids/Electrolytes/Nutrition:Routine in and outs 8. Diabetes mellitus with hyperglycemia. Hemoglobin A1c 9.3.  Patient on Actos, Januvia 100 mg, Glucotrol XL5 mg daily,metformin 1000 mg twice daily prior to admission.   Currently onNovoLog 5units 3 times daily with meals, Levemir 12units twice daily.  Glucophage at 500mg  bid, increased to 1000 BID on 5/20  Diabetic teaching  Labile,?  Improving on 5/25  Monitor with increased mobility 9. Hypertension. Cozaar 25 mg twice daily, increased to 50 on 5/24  Remains elevated on 5/25, but will not make further changes today due to recent changes  Monitor with increased mobility 10. Hyperlipidemia. Lipitor 11. Obesity. BMI 33.19. Dietary follow-up 12. GERD. Protonix 13. Constipation: scheduled senna-s, encourage liquids, fruits, vegetables -dulcolax suppository/fleet enema prn  Adjust bowel  meds as necessary 14. Cough: COVID negative -appears to be mostly pharyngeal, upper airway -Mucinex DM 15.  Hyponatremia  Sodium 132 on 5/18.,  Labs ordered for tomorrow  Continue to monitor 16.  Leukocytosis  WBCs 11.3 on 5/20  Afebrile  Continue to monitor 17. Aortic valve mass: discussed finding with patient and plan for outpatient follow-up/removal.  18. Cough: since current regimen not helping, cough is very bothersome to her, imaging is negative for infection, and dextromethorphan may be contributing to her fatigue, changed to Mucinex to 71mL TID guaifensin-codeine.   LOS: 8 days A FACE TO FACE EVALUATION WAS PERFORMED  Cuinn Westerhold Lorie Phenix 05/07/2021, 10:37 AM

## 2021-05-07 NOTE — Progress Notes (Signed)
Occupational Therapy Session Note  Patient Details  Name: [name] MRN: [id number] Date of Birth: 1956-07-18  Today's Date: 05/07/2021 OT Individual Time: 1129-1200 OT Individual Time Calculation (min): 31 min    Short Term Goals: Week 1:  OT Short Term Goal 1 (Week 1): pt will complete squat pivot transfers with mod A. OT Short Term Goal 2 (Week 1): Pt will be able to hold static stand with min A. OT Short Term Goal 3 (Week 1): Pt will don shirt with min A OT Short Term Goal 4 (Week 1): Pt will be able to don pants over feet with min A. OT Short Term Goal 5 (Week 1): Pt will be able to pull pants over hips with mod A  Skilled Therapeutic Interventions/Progress Updates:    Pt received in room and consented to OT tx. Pt requested to use restroom, when OT brought out RW, pt requested to use [place]. Pt completed STS in [place] with CGA and taken to bathroom for toilet transfer and hygiene. Pt req min A for clothing mgmt, able to perform peri care with setup. Pt then taken back to room and sat down in w/c. Pt requested to readjust her pants, instructed in STS with min a with RW and assist to place L hand on walker. Pt demo'd 1 LOB due to R hand slipping off walker with therapist assist to correct and sit back down. Pt then instructed in use of sock aide to increase independence with footwear mgmt. Pt demo'd good carryover of technique. Instructed in yellow theraputty exercises to engage L hand in activities and to increase strength for ADLs. After tx, pt left up in w/c with all needs met.      Therapy Documentation Precautions:  Precautions Precautions: Fall Precaution Comments: poor L side sensory awareness Restrictions Weight Bearing Restrictions: No  Pain: Pain Assessment Pain Scale: 0-10 Pain Score: 7  Pain Location: Arm Pain Orientation: Left Pain Intervention(s): Medication (See eMAR)   Therapy/Group: Individual Therapy  [name] 05/07/2021, 12:20 PM

## 2021-05-07 NOTE — Progress Notes (Signed)
Physical Therapy Weekly Progress Note  Patient Details  Name: [name] MRN: [id number] Date of Birth: 02-14-1956  Beginning of progress report period: Apr 30, 2021 End of progress report period: May 07, 2021   Today's Date: 05/07/2021 PT Individual Time: 0900-1000 PT Individual Time Calculation (min): 60 min   Patient has met 2 of 3 short term goals. Pt is making appropriate progress towards goals. She is now able to complete bed mobility with minA, squat<>pivot transfers with minA and L knee block, and has ambulated up to 34f with modA and RW. She continues to be primarily limited by L inattention, L hemibody weakness (LUE>LLE), decreased standing>sitting balance, and global deconditioning. Cognitive deficits also impacting her functional mobility, such as mild impulsivity, poor problem solving, decreased safety awareness, and L inattention.  Patient continues to demonstrate the following deficits muscle weakness, decreased cardiorespiratoy endurance, impaired timing and sequencing, unbalanced muscle activation, decreased coordination and decreased motor planning, decreased attention to left and decreased motor planning, decreased initiation, decreased attention, decreased awareness, decreased problem solving and decreased safety awareness and decreased sitting balance, decreased standing balance, decreased postural control, hemiplegia and decreased balance strategies and therefore will continue to benefit from skilled PT intervention to increase functional independence with mobility.  Patient progressing toward long term goals.. Continue plan of care.  PT Short Term Goals Week 1:  PT Short Term Goal 1 (Week 1): Pt will complete bed mobility with minA PT Short Term Goal 1 - Progress (Week 1): Met PT Short Term Goal 2 (Week 1): Pt will complete bed<>chair transfers with minA PT Short Term Goal 2 - Progress (Week 1): Met PT Short Term Goal 3 (Week 1): Pt will ambulate 531fwith modA and  LRAD PT Short Term Goal 3 - Progress (Week 1): Not met Week 2:  PT Short Term Goal 1 (Week 2): Pt will complete bed mobility with CGA PT Short Term Goal 2 (Week 2): Pt will complete bed<>chair transfers with CGA and LRAD PT Short Term Goal 3 (Week 2): Pt will ambulate 2566fith modA and LRAD  Skilled Therapeutic Interventions/Progress Updates:    Pt greeted supine in bed, sleeping soundly and awakens to voice. Agreeable to PT session but needs convincing to participate. Also requires ++ time to "wake up" and adjust to her new morning routine. She completes supine<>sit with CGA with bed features - needs cues for LUE awareness as she tends to leave it behind her back while completing transition. Squat<>pivot transfer with minA towards stronger R side to w/c, cues for reaching towards armrests and L knee block provided for knee stability. W/c transport to main rehab gym for time management and completed additional squat<>pivot transfer with similar technique and minA to mat table. Completed 1x15 sit<>stands with minA (for L knee block only) to RW - needs continuous cues for L hand awareness as it will slide off of RW. Gait training 2x17f3fth mod and RW - step-to gait pattern with poor L foot clearance with decreased hip/knee flexor facilitation, provided knee block during stance for LLE and again needing continuous cues for LUE awareness to prevent sliding off of RW. Next, completed repeated sit<>stands with task overlay for placing red resistive clothespins to basketball net - she needed hand-over-hand assist for guiding LUE to net - multiple errors of dropping the clothespin due to poor LUE grip strength and decreased awareness - frustration from pt noted. Completed 10 minutes of Kinetron with 30cm/sec resistance, working on LLE>RLE hip strengthening for posterior  chain to improve stance control of LLE during gait. Pt needing several rest breaks during this to complete due to fatigue. Handoff of care at end  of session to OT in rehab gym with pt sitting in w/c.  Therapy Documentation Precautions:  Precautions Precautions: Fall Precaution Comments: poor L side sensory awareness Restrictions Weight Bearing Restrictions: No General:    Therapy/Group: Individual Therapy  [name] 05/07/2021, 7:38 AM

## 2021-05-07 NOTE — Patient Care Conference (Signed)
Inpatient RehabilitationTeam Conference and Plan of Care Update Date: 05/07/2021   Time: 11:19 AM    Patient Name: [name]      Medical Record Number: [id number]  Date of Birth: October 07, 1956 Sex: Female         Room/Bed: 4W21C/4W21C-01 Payor Info: Payor: [employer] EM[employer]OYEE / Plan: [employer] / Product Type: *No Product type* /    Admit Date/Time:  04/29/2021  2:23 PM  Primary Diagnosis:  Right thalamic infarction [facility])  Hospital Problems: Principal Problem:   Right thalamic infarction [facility]) Active Problems:   Leukocytosis   Hyponatremia   Essential hypertension   Slow transit constipation   Hemiparesis affecting left side as late effect of stroke ([place])   Uncontrolled type 2 diabetes mellitus with hyperglycemia ([place])   Labile blood glucose   Chronic bilateral low back pain without sciatica    Expected Discharge Date: Expected Discharge Date: 05/21/21  Team Members Present: Physician leading conference: Dr. Delice Lesch Care Coodinator Present: Dorien Chihuahua, RN, BSN, CRRN;Loralee Pacas, [place] Nurse Present: Dorien Chihuahua, RN PT Present: Ginnie Smart, PT OT Present: Other (comment) Advertising account planner) SLP Present: Charolett Bumpers, SLP PPS Coordinator present : Gunnar Fusi, SLP     Current Status/Progress Goal Weekly Team Focus  Bowel/Bladder             Swallow/Nutrition/ Hydration             ADL's   min to mod A overall due to balance, L side awareness and sev imp L side proprioception  min A overall  ADL training, functional mobility and transfers, LUE NMR, sensory training, safety awareness   Mobility   minA bed mobility, minA squat<>pivot transfers, modA gait 16ft with RW  supervision/CGA  Activity tolerance (pt c/o generalized fatigue), functional transfers, L inattention, gait training, L hemibody NMR   Communication             Safety/Cognition/ Behavioral Observations  Min A  Supervision A  mildly complex problem solving, awareness error/left,  recall within task and sustained attention   Pain             Skin               Discharge Planning:  Going to daughter's home where between daughter and daughter's boyfriend can provide 24/7 care. New evaluation today   Team Discussion: Post right thalamic infarct with left inattention, poor safety awareness, poor proprioception on left and cognitive deficits and fatigue,  Patient on target to meet rehab goals: Currently min assist for standing and stand pivot transfers with left knee instability. Ambulates 89' with a rolling walker and mod assist. Min - mod assist overall.  *See Care Plan and progress notes for long and short-term goals.   Revisions to Treatment Plan:  Working on Herbalist, error awareness and recall and left inattention  Teaching Needs: Transfers, toileting, safety, medications, secondary stroke risk management, etc.   Current Barriers to Discharge: Decreased caregiver support and Home enviroment access/layout  Possible Resolutions to Barriers: Family education     Medical Summary Current Status: Left side hemiparesis to right thalamic infarction.  Status post mechanical thrombectomy of right PCA P2 occlusion 04/19/2021  Barriers to Discharge: Medical stability;Weight;Incontinence   Possible Resolutions to [employer] Focus: Therapies, follow labs - Na+, WBCs, optimize DM/BP meds   Continued Need for Acute Rehabilitation Level of Care: The patient requires daily medical management by a physician with specialized training in physical medicine and rehabilitation for the  following reasons: Direction of a multidisciplinary physical rehabilitation program to maximize functional independence : Yes Medical management of patient stability for increased activity during participation in an intensive rehabilitation regime.: Yes Analysis of laboratory values and/or radiology reports with any subsequent need for medication adjustment and/or medical  intervention. : Yes   I attest that I was present, lead the team conference, and concur with the assessment and plan of the team.   Margarito Liner 05/07/2021, 2:33 PM

## 2021-05-07 NOTE — Progress Notes (Signed)
Physical Therapy Session Note  Patient Details  Name: [name] MRN: [id number] Date of Birth: 31-Mar-1956  Today's Date: 05/07/2021 PT Individual Time: 1300-1342 PT Individual Time Calculation (min): 42 min   Short Term Goals: Week 2:  PT Short Term Goal 1 (Week 2): Pt will complete bed mobility with CGA PT Short Term Goal 2 (Week 2): Pt will complete bed<>chair transfers with CGA and LRAD PT Short Term Goal 3 (Week 2): Pt will ambulate 8ft with modA and LRAD  Skilled Therapeutic Interventions/Progress Updates:   Patient received sitting up in wc, agreeable to PT. She reports 7/10 pain in L elbow (denies hitting it, no evidence of injury) and L hamstring. PT assessing for tone in hamstring and none found. Patient premedicated, PT providing rest breaks, distractions and repositioning to assist with pain management. Throughout session, PT needing to orient patient to location of elbow as she would typically have it wedged improperly between herself and the armrest of the wc. PT transporting patient in wc to therapy gym for time management and energy conservation. Standing card matching task with board biased to L to encourage full L visual scan and reaching L within and outside BOS. MinA sit <> stand and CGA standing with use of RW. Patient with intermittent L knee buckling and softly flexing with patient having very little awareness of L knee position indicating poor proprioception. Patient with uncontrolled sitting into wc pulling walker back onto herself. She was not receptive to education and cuing on sitting in a controlled manner stating "well you told me to sit, so I did." PT encouraging patient to reach low to allow for controlled squat and then return to standing to encourage L knee flex then extend, but patient unable to fully extend knee to return to stance. Patient returning to room in wc, transferring to bed via stand pivot with ModA. Bed alarm on, call light within reach.    Therapy Documentation Precautions:  Precautions Precautions: Fall Precaution Comments: poor L side sensory awareness Restrictions Weight Bearing Restrictions: No    Therapy/Group: Individual Therapy  Karoline Caldwell, PT, DPT, CBIS  05/07/2021, 7:49 AM

## 2021-05-07 NOTE — Progress Notes (Addendum)
Speech Language Pathology Weekly Progress and Session Note  Patient Details  Name: [name] MRN: [id number] Date of Birth: 03/05/1956  Beginning of progress report period: 04/30/2021 End of progress report period: 5/252022 Today's Date: 05/07/2021 SLP Individual Time: 1445-1530 SLP Individual Time Calculation (min): 45 min  Short Term Goals: Week 1: SLP Short Term Goal 1 (Week 1): Pt will demonstrate sustained attention to functional task for 45 minutes with supervision A verbal cues. SLP Short Term Goal 1 - Progress (Week 1): Met SLP Short Term Goal 2 (Week 1): Pt will demonstrate mildly complex problem solving skills with supervision A verbal cues. SLP Short Term Goal 2 - Progress (Week 1): Met SLP Short Term Goal 3 (Week 1): Pt will self-monitor and self-correct functional errors with min A verbal cues in problem solving tasks. SLP Short Term Goal 3 - Progress (Week 1): Met     New Short Term Goals: Week 2: SLP Short Term Goal 1 (Week 2): Patient will complete simulated medication management task with supervision A. SLP Short Term Goal 2 (Week 2): Patient will perform alternating attention tasks with minA cues to redirect. SLP Short Term Goal 3 (Week 2): Patient will complete financial/money management task with minA cues for accuracy.  Weekly Progress Updates:  Patient has made good progress and met all 3 STG's. She continues to require cues for attention, awareness and for reducing impact from distractions.    Intensity: Minumum of 1-2 x/day, 30 to 90 minutes Frequency: 3 to 5 out of 7 days Duration/Length of Stay: 6/8 Treatment/Interventions: Cognitive remediation/compensation;Cueing hierarchy;Functional tasks;Medication managment;Internal/external aids;Environmental controls;Patient/family education   Daily Session  Skilled Therapeutic Interventions: Patient seen for skilled ST session to focus on cognitive function goals. Patient able to demonstrate good attention  to left side visual field and able to scan when reading paragraph level with supervision A cues. Patient answered questions related to medication management safety and was 90% accurate. She answered questions during medication label interpretation task and was 100% accurate with supervision to minA cues. She was fidgety with hands throughout session and did frequently require cues to redirect to topic, as she would repeat things she had said without exhibiting awareness. Patient continues to benefit from skilled SLP intervention to maximize cognitive function prior to discharge.    General    Pain Pain Assessment Pain Scale: 0-10 Pain Score: 0-No pain  Therapy/Group: Individual Therapy  Sonia Baller, MA, CCC-SLP Speech Therapy

## 2021-05-08 LAB — GLUCOSE, CAPILLARY
Glucose-Capillary: 126 mg/dL — ABNORMAL HIGH (ref 70–99)
Glucose-Capillary: 98 mg/dL (ref 70–99)
Glucose-Capillary: 64 mg/dL — ABNORMAL LOW (ref 70–99)
Glucose-Capillary: 93 mg/dL (ref 70–99)
Glucose-Capillary: 96 mg/dL (ref 70–99)

## 2021-05-08 LAB — BASIC METABOLIC PANEL
Anion gap: 10 (ref 5–15)
BUN: 12 mg/dL (ref 8–23)
CO2: 26 mmol/L (ref 22–32)
Calcium: 9 mg/dL (ref 8.9–10.3)
Chloride: 96 mmol/L — ABNORMAL LOW (ref 98–111)
Creatinine, Ser: 0.83 mg/dL (ref 0.44–1.00)
GFR, Estimated: >60 mL/min (ref >60)
Glucose, Bld: 128 mg/dL — ABNORMAL HIGH (ref 70–99)
Potassium: 4.3 mmol/L (ref 3.5–5.1)
Sodium: 132 mmol/L — ABNORMAL LOW (ref 135–145)

## 2021-05-08 NOTE — Progress Notes (Signed)
[place] PHYSICAL MEDICINE & REHABILITATION PROGRESS NOTE  Subjective/Complaints: Patient seen laying in bed this AM.  She states she slept well oven right.  She has questions regarding discharge meds.   ROS: Denies CP, SOB, N/V/D  Objective: Vital Signs: Blood pressure (!) 174/83, pulse 74, temperature 97.9 F (36.6 C), temperature source Oral, resp. rate 18, SpO2 95 %. No results found. No results for input(s): WBC, HGB, HCT, PLT in the last 72 hours. Recent Labs    05/08/21 0452  NA 132*  K 4.3  CL 96*  CO2 26  GLUCOSE 128*  BUN 12  CREATININE 0.83  CALCIUM 9.0    Intake/Output Summary (Last 24 hours) at 05/08/2021 1109 Last data filed at 05/08/2021 0733 Gross per 24 hour  Intake 597 ml  Output --  Net 597 ml        Physical Exam: BP (!) 174/83   Pulse 74   Temp 97.9 F (36.6 C) (Oral)   Resp 18   SpO2 95%   Constitutional: No distress . Vital signs reviewed. Obese.  HENT: Normocephalic.  Atraumatic. Eyes: EOMI. No discharge. Cardiovascular: No JVD.  RRR. Respiratory: Normal effort.  No stridor.  Bilateral clear to auscultation. GI: Non-distended.  BS +. Skin: Warm and dry.  Intact. Psych: Normal mood.  Normal behavior. Musc: No edema in extremities.  No tenderness in extremities. Neuro: Alert Follows commands.  Left inattention Fair insight and awareness.  Motor:LUE 4-4+/5 prox to distal with apraxia, unchanged LLE: 4-/5 prox to distal.   Assessment/Plan: 1. Functional deficits which require 3+ hours per day of interdisciplinary therapy in a comprehensive inpatient rehab setting.  Physiatrist is providing close team supervision and 24 hour management of active medical problems listed below.  Physiatrist and rehab team continue to assess barriers to discharge/monitor patient progress toward functional and medical goals   Care Tool:  Bathing    Body parts bathed by patient: Chest,Abdomen,Right upper leg,Left upper leg,Face,Left arm,Front  perineal area,Buttocks,Right arm,Right lower leg,Left lower leg (used long sponge)   Body parts bathed by helper: Right lower leg,Left lower leg     Bathing assist Assist Level: Minimal Assistance - Patient > 75%     Upper Body Dressing/Undressing Upper body dressing   What is the patient wearing?: Pull over shirt    Upper body assist Assist Level: Contact Guard/Touching assist    Lower Body Dressing/Undressing Lower body dressing      What is the patient wearing?: Underwear/pull up,Pants     Lower body assist Assist for lower body dressing: Moderate Assistance - Patient 50 - 74%     Toileting Toileting    Toileting assist Assist for toileting: Moderate Assistance - Patient 50 - 74%     Transfers Chair/bed transfer  Transfers assist     Chair/bed transfer assist level: Moderate Assistance - Patient 50 - 74%     Locomotion Ambulation   Ambulation assist      Assist level: 2 helpers (modA + w/c follow) Assistive device: Other (comment) (R handrail) Max distance: 61ft   Walk 10 feet activity   Assist  Walk 10 feet activity did not occur: Safety/medical concerns  Assist level: 2 helpers Assistive device: Other (comment) (R hand rail)   Walk 50 feet activity   Assist Walk 50 feet with 2 turns activity did not occur: Safety/medical concerns         Walk 150 feet activity   Assist Walk 150 feet activity did not occur: Safety/medical concerns  Walk 10 feet on uneven surface  activity   Assist Walk 10 feet on uneven surfaces activity did not occur: Safety/medical concerns         Wheelchair     Assist Will patient use wheelchair at discharge?: No   Wheelchair activity did not occur: N/A         Wheelchair 50 feet with 2 turns activity    Assist    Wheelchair 50 feet with 2 turns activity did not occur: N/A       Wheelchair 150 feet activity     Assist  Wheelchair 150 feet activity did not occur:  N/A        Medical Problem List and Plan: 1.Left side hemiparesis to right thalamic infarction. Status post mechanical thrombectomy of right PCA P2 occlusion 04/19/2021. Status post loop recorder  Continue CIR  WHO nightly  Discussed with condition with son. 2. Antithrombotics: -DVT/anticoagulation:SCDs -antiplatelet therapy: Aspirin 81 mg daily, Plavix 75 mg dailyx3 weeks then aspirin alone 3. Pain Management:Tylenol as needed  Robaxin as needed started on 5/20, conitnue  Controlled with meds on 5/26 4. Mood:Zoloft 100 mg daily, melatonin as needed. Provide emotional support -antipsychotic agents: N/A 5. Neuropsych: This patientiscapable of making decisions on herown behalf.  Discussed with Neuropsych - appreciate eval 6. Skin/Wound Care:Routine skin checks 7. Fluids/Electrolytes/Nutrition:Routine in and outs 8. Diabetes mellitus with hyperglycemia. Hemoglobin A1c 9.3.  Patient on Actos, Januvia 100 mg, Glucotrol XL5 mg daily,metformin 1000 mg twice daily prior to admission.   Currently onNovoLog 5units 3 times daily with meals, Levemir 12units twice daily.  Glucophage at 500mg  bid, increased to 1000 BID on 5/20  Diabetic teaching  Stabilizing on 5/26  Monitor with increased mobility 9. Hypertension. Cozaar 25 mg twice daily, increased to 50 on 5/24  Extremely labile on 5/26, monitor for trend  Monitor with increased mobility 10. Hyperlipidemia. Lipitor 11. Obesity. BMI 33.19. Dietary follow-up 12. GERD. Protonix 13. Constipation: scheduled senna-s, encourage liquids, fruits, vegetables -dulcolax suppository/fleet enema prn  Adjust bowel meds as necessary 14. Cough: COVID negative -appears to be mostly pharyngeal, upper airway -Mucinex DM 15.  Hyponatremia  Sodium 132 on 5/26  Continue to monitor 16.  Leukocytosis  WBCs 11.3 on 5/20, continue to monitor  Afebrile  Continue to  monitor 17. Aortic valve mass: discussed finding with patient and plan for outpatient follow-up/removal.  18. Cough: since current regimen not helping, cough is very bothersome to her, imaging is negative for infection, and dextromethorphan may be contributing to her fatigue, changed to Mucinex to 61mL TID guaifensin-codeine.   LOS: 9 days A FACE TO FACE EVALUATION WAS PERFORMED  Zoua Caporaso Lorie Phenix 05/08/2021, 11:09 AM

## 2021-05-08 NOTE — Progress Notes (Signed)
Occupational Therapy Session Note  Patient Details  Name: [name] MRN: [id number] Date of Birth: Nov 14, 1956  Today's Date: 05/08/2021 OT Individual Time: 1430-1530 OT Individual Time Calculation (min): 60 min    Short Term Goals: Week 1:  OT Short Term Goal 1 (Week 1): pt will complete squat pivot transfers with mod A. OT Short Term Goal 2 (Week 1): Pt will be able to hold static stand with min A. OT Short Term Goal 3 (Week 1): Pt will don shirt with min A OT Short Term Goal 4 (Week 1): Pt will be able to don pants over feet with min A. OT Short Term Goal 5 (Week 1): Pt will be able to pull pants over hips with mod A  Skilled Therapeutic Interventions/Progress Updates:    Pt received in bed in room and consented to OT tx. Session focused on ADL routine including bathing, dressing, transfer training, and LUE strengthening. Per pt request, Charlaine Dalton used to get pt in and out of shower. Pt req mod A to stand from lower surface, CGA to stand in [place] from raised height surface. Pt bathed with close SUP, demo's 2 LOB during session during dynamic sitting activities with therapist CGA to recover. Pt reports "oh I'm not going to fall" each time. Demo's some safety awareness deficits 2/2 L side inattention. Dressed EOB with setup for UBD, mod A to stand and hike pants with RW. Instructed in BUE strengthening HEP to increase strength for ADLs and functional transfers. Pt instructed to squeeze green hand gripper 3x10 manipulating between each hand, then instructed to chest press, shoulder horizontal ad/abduction and  and shoulder flexion while semifowler in bed and holding hand gripper for 3x10. After tx, pt left in bed with all needs met.   Therapy Documentation Precautions:  Precautions Precautions: Fall Precaution Comments: poor L side sensory awareness Restrictions Weight Bearing Restrictions: No Vital Signs: Therapy Vitals Temp: 98.2 F (36.8 C) Temp Source: Oral Pulse Rate: 79 Resp:  15 BP: (!) 143/63 Patient Position (if appropriate): Lying Oxygen Therapy SpO2: 97 % O2 Device: Room Air Pain: Pain Assessment Pain Scale: 0-10 Pain Score: 4  Pain Location: Arm Pain Orientation: Left Pain Intervention(s): Medication (See eMAR)   Therapy/Group: Individual Therapy  Kerri Kovacik 05/08/2021, 3:14 PM

## 2021-05-08 NOTE — Progress Notes (Signed)
Hypoglycemic Event    CBG: 64  Treatment: 8 oz juice/soda  Symptoms: None  Follow-up CBG: Time:1754 CBG Result: 94  Possible Reasons for Event: Medication regimen: insulin  Comments/MD notified:    Sheela Stack

## 2021-05-08 NOTE — Progress Notes (Signed)
Physical Therapy Session Note  Patient Details  Name: [name] MRN: [id number] Date of Birth: Oct 24, 1956  Today's Date: 05/08/2021 PT Individual Time: 8676-1950 PT Individual Time Calculation (min): 40 min   Short Term Goals: Week 2:  PT Short Term Goal 1 (Week 2): Pt will complete bed mobility with CGA PT Short Term Goal 2 (Week 2): Pt will complete bed<>chair transfers with CGA and LRAD PT Short Term Goal 3 (Week 2): Pt will ambulate 64ft with modA and LRAD  Skilled Therapeutic Interventions/Progress Updates:    Patient in supine and daughter in the room.  Relates events of prior therapy today.  Performed supine to sit with S.  Transfer to w/c squat pivot with min A.  Patient assisted in w/c to therapy gym with daughter following to watch.  Patient sit to stand to RW with min A and cues.  Patient ambulated 20' with min to mod A and cues for L foot management on turns.  Patient performed NMR with forced use on L standing with R foot on 6" step reaching for and placing horse shoes on rim of basketball goal on L side with mod A/facilitation for L hip & knee stabilization and cues for posture.  Patient performed x 3 reps with 6 horse shoes.  Patient ambulated 44' with RW w/ L hand splint and min to mod A.  In parallel bars patient negotiated 1 step with mod A x 2 with cues for technique.  Patient assisted in w/c to room.  In bathroom in w/c stand pivot to toilet with 3:1 over top with mod A and max cues for stand step to toilet using grabbar and for clothing management.  Patient sit to stand and completed hygiene in standing with S and mod A for clothing management.  Patient assisted to w/c using RW and min to mod A.  Assisted to bed and stand step with min A.  Left in supine with call bell and needs in reach and bed alarm active.  Therapy Documentation Precautions:  Precautions Precautions: Fall Precaution Comments: poor L side sensory awareness Restrictions Weight Bearing Restrictions:  No  Pain: Pain Assessment Pain Score: 3  Faces Pain Scale: Hurts little more Pain Type: Acute pain Pain Location: Arm Pain Descriptors / Indicators: Aching Pain Onset: With Activity Pain Intervention(s): Heat applied;Repositioned    Therapy/Group: Individual Therapy  [place], [place] 05/08/2021, 6:12 PM

## 2021-05-08 NOTE — Plan of Care (Signed)
  Problem: RH PAIN MANAGEMENT Goal: RH STG PAIN MANAGED AT OR BELOW PT'S PAIN GOAL Description: At or below level 4 05/08/2021 1652 by Sheela Stack, LPN Outcome: Not Progressing 05/08/2021 1652 by Sheela Stack, LPN Outcome: Not Progressing

## 2021-05-08 NOTE — Progress Notes (Signed)
Speech Language Pathology Daily Session Note  Patient Details  Name: [name] MRN: [id number] Date of Birth: [birth year]  Today's Date: 05/08/2021 SLP Individual Time:  -     Short Term Goals: Week 2: SLP Short Term Goal 1 (Week 2): Patient will complete simulated medication management task with supervision A. SLP Short Term Goal 2 (Week 2): Patient will perform alternating attention tasks with minA cues to redirect. SLP Short Term Goal 3 (Week 2): Patient will complete financial/money management task with minA cues for accuracy.  Skilled Therapeutic Interventions: Skilled SLP intervention focused on cognition.  Pt demonstrated good attention to left side of medical document during reading task and with scanning left to right on table to locate cards requested by SLP. Pt alternated attention with min A during moderately complex problem solving task and needed visual cue x2 for awareness of errors. Pt given written list of medications for stroke prevention and answered problem solving questions related to times taken and where pills would be inserted in pill organizer with min A verbal cues. Cont with therapy per plan of care.      Pain Pain Assessment Pain Scale: Faces Pain Score: 0-No pain Faces Pain Scale: No hurt Pain Location: Hand Pain Orientation: Left Pain Intervention(s): Medication (See eMAR)  Therapy/Group: Individual Therapy  Darrol Poke Stefhanie Kachmar 05/08/2021, 10:42 AM

## 2021-05-08 NOTE — Progress Notes (Signed)
Physical Therapy Session Note  Patient Details  Name: [name] MRN: [id number] Date of Birth: [birth year]  Today's Date: 05/08/2021 PT Individual Time: 4356-8616 PT Individual Time Calculation (min): 70 min   Short Term Goals: Week 2:  PT Short Term Goal 1 (Week 2): Pt will complete bed mobility with CGA PT Short Term Goal 2 (Week 2): Pt will complete bed<>chair transfers with CGA and LRAD PT Short Term Goal 3 (Week 2): Pt will ambulate 53f with modA and LRAD  Skilled Therapeutic Interventions/Progress Updates:     NT assisting pt on toilet with PT arrival. Pt standing in SArdentownwith NT assisting with posterior pericare - completed with totalA. Stedy transfer to her w/c as pt agreeable to PT session. Wheeled to main rehab gym for time management. Completed squat<>pivot transfer with minA and L knee block to mat table. Instructed on 1x15 sit<>stands to RW with minA for L knee block but mod cues needed for safety awareness and controlled descent. Next, focused on static standing marching with RW support and minA for balance - emphasis on L foot and hand awareness while completing this - poor ability to self correct L knee buckling during stance. Completed gait training, ambulated 334f+ 3070fith modA and RW - assist needed for RW management, L knee block in stance due to intermittent knee buckling, and general steadying for gait mechanics. Next, focused on lateral stepping along mat table with min/modA for balance and RW support - continued to require intermittent assist for L knee instability. Next, focused on standing tolerance/balance with dual-task overlay for removing pegs from peg board with her paretic LUE - she required minA for balance (fading to modA with fatigue) but demonstrated improved ability to correct L knee instability as well as functionality of her LUE compared to previous sessions. Squat<>pivot completed with minA and L knee block to the w/c. Wheeled back to her room and  completed additional squat<>pivot in similar fashion with use of bedrial. Able to complete sit>supine with supervision with cues needed for LUE awareness/positioning. All needs met at end of session.  Therapy Documentation Precautions:  Precautions Precautions: Fall Precaution Comments: poor L side sensory awareness Restrictions Weight Bearing Restrictions: No General:    Therapy/Group: Individual Therapy  Kimberly Simons26/2022, 7:39 AM

## 2021-05-09 LAB — GLUCOSE, CAPILLARY
Glucose-Capillary: 106 mg/dL — ABNORMAL HIGH (ref 70–99)
Glucose-Capillary: 277 mg/dL — ABNORMAL HIGH (ref 70–99)
Glucose-Capillary: 111 mg/dL — ABNORMAL HIGH (ref 70–99)
Glucose-Capillary: 122 mg/dL — ABNORMAL HIGH (ref 70–99)

## 2021-05-09 MED ORDER — LOSARTAN POTASSIUM 50 MG PO TABS
75.0000 mg | ORAL_TABLET | Freq: Two times a day (BID) | ORAL | Status: DC
  Administered 2021-05-09 – 2021-05-13 (×8): 75 mg via ORAL
  Filled 2021-05-09 (×8): qty 2

## 2021-05-09 NOTE — Progress Notes (Signed)
[facility] PHYSICAL MEDICINE & REHABILITATION PROGRESS NOTE  Subjective/Complaints: Patient seen laying in bed this AM.  She states she slept well overnight.  She has questions again regarding meds for RLS.    ROS: Denies CP, SOB, N/V/D  Objective: Vital Signs: Blood pressure (!) 179/80, pulse (!) 59, temperature 97.9 F (36.6 C), temperature source Oral, resp. rate 17, SpO2 96 %. No results found. No results for input(s): WBC, HGB, HCT, PLT in the last 72 hours. Recent Labs    05/08/21 0452  NA 132*  K 4.3  CL 96*  CO2 26  GLUCOSE 128*  BUN 12  CREATININE 0.83  CALCIUM 9.0    Intake/Output Summary (Last 24 hours) at 05/09/2021 0848 Last data filed at 05/08/2021 2259 Gross per 24 hour  Intake 1120 ml  Output --  Net 1120 ml        Physical Exam: BP (!) 179/80 (BP Location: Right Arm)   Pulse (!) 59   Temp 97.9 F (36.6 C) (Oral)   Resp 17   SpO2 96%   Constitutional: No distress . Vital signs reviewed. Obese.  HENT: Normocephalic.  Atraumatic. Eyes: EOMI. No discharge. Cardiovascular: No JVD.  RRR. Respiratory: Normal effort.  No stridor.  Bilateral clear to auscultation. GI: Non-distended.  BS +. Skin: Warm and dry.  Intact. Psych: Normal mood.  Normal behavior. Musc: No edema in extremities.  No tenderness in extremities. Neuro: Alert Follows commands.  Left inattention Fair insight and awareness.  Motor:LUE 4-4+/5 prox to distal with apraxia, stable LLE: 4-/5 prox to distal, improving  Assessment/Plan: 1. Functional deficits which require 3+ hours per day of interdisciplinary therapy in a comprehensive inpatient rehab setting.  Physiatrist is providing close team supervision and 24 hour management of active medical problems listed below.  Physiatrist and rehab team continue to assess barriers to discharge/monitor patient progress toward functional and medical goals   Care Tool:  Bathing    Body parts bathed by patient: Chest,Abdomen,Right  upper leg,Left upper leg,Face,Left arm,Front perineal area,Buttocks,Right arm,Right lower leg,Left lower leg   Body parts bathed by helper: Right lower leg,Left lower leg     Bathing assist Assist Level: Contact Guard/Touching assist     Upper Body Dressing/Undressing Upper body dressing   What is the patient wearing?: Pull over shirt    Upper body assist Assist Level: Supervision/Verbal cueing    Lower Body Dressing/Undressing Lower body dressing      What is the patient wearing?: Underwear/pull up,Pants     Lower body assist Assist for lower body dressing: Moderate Assistance - Patient 50 - 74%     Toileting Toileting    Toileting assist Assist for toileting: Moderate Assistance - Patient 50 - 74%     Transfers Chair/bed transfer  Transfers assist     Chair/bed transfer assist level: Minimal Assistance - Patient > 75%     Locomotion Ambulation   Ambulation assist      Assist level: Moderate Assistance - Patient 50 - 74% Assistive device: Walker-rolling (hand splint) Max distance: 25'   Walk 10 feet activity   Assist  Walk 10 feet activity did not occur: Safety/medical concerns  Assist level: Moderate Assistance - Patient - 50 - 74% Assistive device: Walker-rolling,Orthosis (hand splint)   Walk 50 feet activity   Assist Walk 50 feet with 2 turns activity did not occur: Safety/medical concerns         Walk 150 feet activity   Assist Walk 150 feet activity did not  occur: Safety/medical concerns         Walk 10 feet on uneven surface  activity   Assist Walk 10 feet on uneven surfaces activity did not occur: Safety/medical concerns         Wheelchair     Assist Will patient use wheelchair at discharge?: No   Wheelchair activity did not occur: N/A         Wheelchair 50 feet with 2 turns activity    Assist    Wheelchair 50 feet with 2 turns activity did not occur: N/A       Wheelchair 150 feet activity      Assist  Wheelchair 150 feet activity did not occur: N/A        Medical Problem List and Plan: 1.Left side hemiparesis to right thalamic infarction. Status post mechanical thrombectomy of right PCA P2 occlusion 04/19/2021. Status post loop recorder  Continue CIR  WHO nightly  Updated son on 5/26. 2. Antithrombotics: -DVT/anticoagulation:SCDs -antiplatelet therapy: Aspirin 81 mg daily, Plavix 75 mg dailyx3 weeks then aspirin alone 3. Pain Management:Tylenol as needed  Robaxin as needed started on 5/20, conitnue  Controlled with meds on 5/27 4. Mood:Zoloft 100 mg daily, melatonin as needed. Provide emotional support -antipsychotic agents: N/A 5. Neuropsych: This patientiscapable of making decisions on herown behalf.  Discussed with Neuropsych - appreciate eval 6. Skin/Wound Care:Routine skin checks 7. Fluids/Electrolytes/Nutrition:Routine in and outs 8. Diabetes mellitus with hyperglycemia. Hemoglobin A1c 9.3.  Patient on Actos, Januvia 100 mg, Glucotrol XL5 mg daily,metformin 1000 mg twice daily prior to admission.   Currently onNovoLog 5units 3 times daily with meals, Levemir 12units twice daily.  Glucophage at 500mg  bid, increased to 1000 BID on 5/20  Diabetic teaching  Relatively controlled on 5/27  Monitor with increased mobility 9. Hypertension. Cozaar 25 mg twice daily, increased to 50 on 5/24, changed to 75 BID on 5/27  Monitor with increased mobility 10. Hyperlipidemia. Lipitor 11. Obesity. BMI 33.19. Dietary follow-up 12. GERD. Protonix 13. Constipation: scheduled senna-s, encourage liquids, fruits, vegetables -dulcolax suppository/fleet enema prn  Adjust bowel meds as necessary 14. Cough: COVID negative -appears to be mostly pharyngeal, upper airway -Mucinex DM 15.  Hyponatremia  Sodium 132 on 5/26, labs ordered for Monday  Continue to monitor 16.   Leukocytosis  WBCs 11.3 on 5/20, labs ordered for Monday  Continue to monitor  Afebrile  Continue to monitor 17. Aortic valve mass: discussed finding with patient and plan for outpatient follow-up/removal.  18. Cough: since current regimen not helping, cough is very bothersome to her, imaging is negative for infection, and dextromethorphan may be contributing to her fatigue, changed to Mucinex to 40mL TID guaifensin-codeine.   LOS: 10 days A FACE TO FACE EVALUATION WAS PERFORMED  [name] 05/09/2021, 8:48 AM

## 2021-05-09 NOTE — Progress Notes (Signed)
Occupational Therapy Session Note  Patient Details  Name: [name] MRN: [id number] Date of Birth: 04-07-1956  Today's Date: 05/09/2021 OT Individual Time: 5041-3643 OT Individual Time Calculation (min): 60 min  and Today's Date: 05/09/2021 OT Missed Time: 15 Minutes Missed Time Reason: Other (comment) (tornado warning)   Short Term Goals: Week 1:  OT Short Term Goal 1 (Week 1): pt will complete squat pivot transfers with mod A. OT Short Term Goal 2 (Week 1): Pt will be able to hold static stand with min A. OT Short Term Goal 3 (Week 1): Pt will don shirt with min A OT Short Term Goal 4 (Week 1): Pt will be able to don pants over feet with min A. OT Short Term Goal 5 (Week 1): Pt will be able to pull pants over hips with mod A  Skilled Therapeutic Interventions/Progress Updates:    Pt received in bee and consented to OT tx. Pt requested to use restroom, encouraged to not use Stedy during therapy sessions to increase functional transfer independence. Pt completed SPT's with mod A and therapist blocking L knee from bed>w/c><commode. Pt continues to demo decreased proprioception and awareness on L side. Pt completed hand hygiene and oral care sink side with mod I. Pt taken down to gym and instructed in fine motor control and coordination tasks. Pt instructed in large peg board tasks and to match her board with reference card placed on L side to increase attention to L side. Pt able to compete activity picking up and placing pegs with increased time, min A from RUE as needed for B manipulation. After tx, pt helped back to room and left up in w/c with all needs met.   Therapy Documentation Precautions:  Precautions Precautions: Fall Precaution Comments: poor L side sensory awareness Restrictions Weight Bearing Restrictions: No   Pain: none     Therapy/Group: Individual Therapy  Jeniah Kishi 05/09/2021, 9:34 AM

## 2021-05-09 NOTE — Progress Notes (Signed)
Occupational Therapy Session Note  Patient Details  Name: [name] MRN: [id number] Date of Birth: 18-Dec-1955  Today's Date: 05/09/2021 OT Individual Time: 7517-0017 OT Individual Time Calculation (min): 60 min    Short Term Goals: Week 1:  OT Short Term Goal 1 (Week 1): pt will complete squat pivot transfers with mod A. OT Short Term Goal 2 (Week 1): Pt will be able to hold static stand with min A. OT Short Term Goal 3 (Week 1): Pt will don shirt with min A OT Short Term Goal 4 (Week 1): Pt will be able to don pants over feet with min A. OT Short Term Goal 5 (Week 1): Pt will be able to pull pants over hips with mod A  Skilled Therapeutic Interventions/Progress Updates:    Pt participated in rhythmic drumming group. Pain in R elbow after acticity and heat applied Focus of group on BUE coordination, strengthening, endurance, timing/control, activity tolerance, and social participation and engagement. Pt performs session from seated position for energy conservation. Skilled interventions included slower movement sot improve timing and control, cuing for AAROM for certain movements of RUE helping LUE, and foam grip/coban wrap around hand to facilitate L grasp. Warm up performed prior to exercises and UB stretching completed at end of group with demo from OT. Pt able to select preferred song to share with group. Returned pt to room at end of session. Exited session with pt seated in bed, exit alarm on and call light in reach  Therapy Documentation Precautions:  Precautions Precautions: Fall Precaution Comments: poor L side sensory awareness Restrictions Weight Bearing Restrictions: No General:   Vital Signs: Therapy Vitals Temp: 97.9 F (36.6 C) Temp Source: Oral Pulse Rate: (!) 59 Resp: 17 BP: (!) 179/80 Patient Position (if appropriate): Lying Oxygen Therapy SpO2: 96 % O2 Device: Room Air Pain:   ADL: ADL Eating: Set up Grooming: Setup Upper Body Bathing:  Supervision/safety Where Assessed-Upper Body Bathing: Shower Lower Body Bathing: Minimal assistance Where Assessed-Lower Body Bathing: Shower Upper Body Dressing: Contact guard Where Assessed-Upper Body Dressing: Sitting at sink Lower Body Dressing: Moderate assistance Where Assessed-Lower Body Dressing: Sitting at sink,Standing at sink Toileting: Moderate assistance Toilet Transfer: Minimal assistance Toilet Transfer Method: Squat pivot Toilet Transfer Equipment: Energy manager: Environmental education officer Method: Education officer, environmental: Engineer, maintenance (IT)    Praxis   Exercises:   Other Treatments:     Therapy/Group: Group Therapy  [name] 05/09/2021, 6:51 AM

## 2021-05-09 NOTE — Progress Notes (Signed)
Physical Therapy Session Note  Patient Details  Name: [name] MRN: [id number] Date of Birth: May 14, 1956  Today's Date: 05/09/2021 PT Individual Time: 1450-1540 PT Individual Time Calculation (min): 50 min   Short Term Goals: Week 1:  PT Short Term Goal 1 (Week 1): Pt will complete bed mobility with minA PT Short Term Goal 1 - Progress (Week 1): Met PT Short Term Goal 2 (Week 1): Pt will complete bed<>chair transfers with minA PT Short Term Goal 2 - Progress (Week 1): Met PT Short Term Goal 3 (Week 1): Pt will ambulate 65f with modA and LRAD PT Short Term Goal 3 - Progress (Week 1): Not met Week 2:  PT Short Term Goal 1 (Week 2): Pt will complete bed mobility with CGA PT Short Term Goal 2 (Week 2): Pt will complete bed<>chair transfers with CGA and LRAD PT Short Term Goal 3 (Week 2): Pt will ambulate 236fwith modA and LRAD Week 3:     Skilled Therapeutic Interventions/Progress Updates:    Pain:  Pt reports  LUE ulnar region pain.  Treatment to tolerance.  Rest breaks and repositioning as needed. UE supported and warmpad applied at end of session.  Pt initially supine and agreeable to treatment session w/focus on functional mobility. Pt supine to sit w/cues to attend to L hemibody, cues for sequencing.   Squat pivot to L to wc w/min assist, cues for setup and positioning of L limbs.  Standing at wall reaching crossbody to L side to locate/tap numbers on wall offset to L.  Pt easily distracted by activity to R resulting in decreased control/extension of L knee in standing, max cues to recover, occrus mult times during activity.  cga to mod assist and max cues for balance.  Gait 3588f 1 w/RW and min assist, pt self cues "left, right, left etc", decreased clearance LLE, somewhat ataxic advancement at times.   Gait approaching bowling pins and navigating past via veering to R/clearing obstacle to L, when approaching obstacles, pt consistently fails to clear LLE thru swing due to  difficulty multitasking/L inattention more than weakness x 55f62fRepeated x 2 w/improved attention w/second pass.  With each transition sit to stand or stand to sit, pt tends to fail to fully turn/back w/L hemibody and requires max cues for safety.  Repeated demonstration mult times of safe technique during session.  Pt w/improved performance w/direct cues to focus on this during activity but limited carryover.  Will need repeated practice/cueing due to inattention.  wc to bed w/cga and RW, cues for set up of L foot prior to sit to stand, turns and backs fully but fails to remove hand from orthosis and does not recognize this even once sitting.   Sit to supine w/cga, cues for attention to L exts.  Pt left supine w/rails up x 3, alarm set, bed in lowest position, and needs in reach.   Therapy Documentation Precautions:  Precautions Precautions: Fall Precaution Comments: poor L side sensory awareness Restrictions Weight Bearing Restrictions: No    Therapy/Group: Individual Therapy  BarbCallie Fielding  [place]7/2022, 4:00 PM

## 2021-05-10 LAB — GLUCOSE, CAPILLARY
Glucose-Capillary: 130 mg/dL — ABNORMAL HIGH (ref 70–99)
Glucose-Capillary: 171 mg/dL — ABNORMAL HIGH (ref 70–99)
Glucose-Capillary: 120 mg/dL — ABNORMAL HIGH (ref 70–99)
Glucose-Capillary: 183 mg/dL — ABNORMAL HIGH (ref 70–99)

## 2021-05-10 MED ORDER — GABAPENTIN 100 MG PO CAPS
100.0000 mg | ORAL_CAPSULE | Freq: Every day | ORAL | Status: DC
  Administered 2021-05-10 – 2021-05-26 (×17): 100 mg via ORAL
  Filled 2021-05-10 (×17): qty 1

## 2021-05-10 NOTE — Progress Notes (Signed)
PRN tylenol given at 2027, for complaint of LUE discomfort. Splint applied, patient removed after 30 minutes. Requesting stronger med to manage pain. PRN melatonin given at 2216, slept from 0100-0600. [name] A

## 2021-05-10 NOTE — Progress Notes (Signed)
Physical Therapy Session Note  Patient Details  Name: [name] MRN: [id number] Date of Birth: 04-Jul-1956  Today's Date: 05/10/2021 PT Individual Time: 5462-7035 PT Individual Time Calculation (min): 43 min   Short Term Goals: Week 2:  PT Short Term Goal 1 (Week 2): Pt will complete bed mobility with CGA PT Short Term Goal 2 (Week 2): Pt will complete bed<>chair transfers with CGA and LRAD PT Short Term Goal 3 (Week 2): Pt will ambulate 11ft with modA and LRAD  Skilled Therapeutic Interventions/Progress Updates:    Patient received supine in bed, agreeable to PT. She reports intermittent pain in L elbow, low back, R hip, L LE at variable times- premedicated. PT providing rest breaks, distractions and repositioning to assist with pain management. She also reports needing to use the restroom. She was able to come sit edge of bed with supervision and verbal cues to attend to L hemi body. Patient ambulating to bathroom with RW and MinA with verbal cues for increased L step length and foot clearance. Patient continent of bowel and required ModA for clothing management and perihygiene in standing. Patient requiring consistent verbal cues to maintain appropriate L knee extension in stance throughout all peri care. Patient ambulating to wc with MinA and RW. PT transporting patient in wc to therapy gym for time management and energy conservation. NuStep completed x8 mins with B LE only with emphasis on large amplitude, coordinated steps involving L LE. Patient with poor attention to L foot placement on pedal often requiring PT verbal cues or intervention to replace L LE on pedal. Patient returning to room in wc. Ambulating to bed with RW and MinA, verbal cues to attend to L to ensure adequate step length and foot clearance. Patient with uncontrolled sit onto bed and no awareness that L UE was still strapped to modified hand grip on RW when trying to return supine. Once PT removed hand from walker,  patient ultimately able to lay down with supervision. Bed alarm on, call light within reach.   Therapy Documentation Precautions:  Precautions Precautions: Fall Precaution Comments: poor L side sensory awareness Restrictions Weight Bearing Restrictions: No    Therapy/Group: Individual Therapy  Karoline Caldwell, PT, DPT, CBIS  05/10/2021, 7:42 AM

## 2021-05-10 NOTE — Progress Notes (Signed)
[place] PHYSICAL MEDICINE & REHABILITATION PROGRESS NOTE  Subjective/Complaints: Still having some pain in her left arm and to a lesser extent her left leg. Throbbing type of pain. Worse at night  ROS: Patient denies fever, rash, sore throat, blurred vision, nausea, vomiting, diarrhea, cough, shortness of breath or chest pain,  headache, or mood change.   Objective: Vital Signs: Blood pressure (!) 166/71, pulse 71, temperature 98.3 F (36.8 C), resp. rate 18, SpO2 92 %. No results found. No results for input(s): WBC, HGB, HCT, PLT in the last 72 hours. Recent Labs    05/08/21 0452  NA 132*  K 4.3  CL 96*  CO2 26  GLUCOSE 128*  BUN 12  CREATININE 0.83  CALCIUM 9.0    Intake/Output Summary (Last 24 hours) at 05/10/2021 1213 Last data filed at 05/10/2021 0741 Gross per 24 hour  Intake 480 ml  Output --  Net 480 ml        Physical Exam: BP (!) 166/71   Pulse 71   Temp 98.3 F (36.8 C)   Resp 18   SpO2 92%   Constitutional: No distress . Vital signs reviewed. HEENT: EOMI, oral membranes moist Neck: supple Cardiovascular: RRR without murmur. No JVD    Respiratory/Chest: CTA Bilaterally without wheezes or rales. Normal effort    GI/Abdomen: BS +, non-tender, non-distended Ext: no clubbing, cyanosis, or edema Psych: pleasant and cooperative. Musc: LUE mildly tender with palpation and PROM, no jt swelling, discoloration Neuro: Alert Follows commands.  Left inattention Fair insight and awareness.  Motor:LUE 4-4+/5 prox to distal with mild apraxia LLE: 4-/5 prox to distal, improving  Assessment/Plan: 1. Functional deficits which require 3+ hours per day of interdisciplinary therapy in a comprehensive inpatient rehab setting.  Physiatrist is providing close team supervision and 24 hour management of active medical problems listed below.  Physiatrist and rehab team continue to assess barriers to discharge/monitor patient progress toward functional and medical  goals   Care Tool:  Bathing    Body parts bathed by patient: Chest,Abdomen,Right upper leg,Left upper leg,Face,Left arm,Front perineal area,Buttocks,Right arm,Right lower leg,Left lower leg   Body parts bathed by helper: Right lower leg,Left lower leg     Bathing assist Assist Level: Contact Guard/Touching assist     Upper Body Dressing/Undressing Upper body dressing   What is the patient wearing?: Pull over shirt    Upper body assist Assist Level: Supervision/Verbal cueing    Lower Body Dressing/Undressing Lower body dressing      What is the patient wearing?: Underwear/pull up,Pants     Lower body assist Assist for lower body dressing: Moderate Assistance - Patient 50 - 74%     Toileting Toileting    Toileting assist Assist for toileting: Moderate Assistance - Patient 50 - 74%     Transfers Chair/bed transfer  Transfers assist     Chair/bed transfer assist level: Contact Guard/Touching assist     Locomotion Ambulation   Ambulation assist      Assist level: Minimal Assistance - Patient > 75% Assistive device: Walker-rolling Max distance: 35   Walk 10 feet activity   Assist  Walk 10 feet activity did not occur: Safety/medical concerns  Assist level: Minimal Assistance - Patient > 75% Assistive device: Walker-rolling,Orthosis   Walk 50 feet activity   Assist Walk 50 feet with 2 turns activity did not occur: Safety/medical concerns  Assist level: Minimal Assistance - Patient > 75% Assistive device: Walker-rolling    Walk 150 feet activity  Assist Walk 150 feet activity did not occur: Safety/medical concerns         Walk 10 feet on uneven surface  activity   Assist Walk 10 feet on uneven surfaces activity did not occur: Safety/medical concerns         Wheelchair     Assist Will patient use wheelchair at discharge?: No   Wheelchair activity did not occur: N/A         Wheelchair 50 feet with 2 turns  activity    Assist    Wheelchair 50 feet with 2 turns activity did not occur: N/A       Wheelchair 150 feet activity     Assist  Wheelchair 150 feet activity did not occur: N/A        Medical Problem List and Plan: 1.Left side hemiparesis to right thalamic infarction. Status post mechanical thrombectomy of right PCA P2 occlusion 04/19/2021. Status post loop recorder  Continue CIR  WHO nightly  Updated son on 5/26. 2. Antithrombotics: -DVT/anticoagulation:SCDs -antiplatelet therapy: Aspirin 81 mg daily, Plavix 75 mg dailyx3 weeks then aspirin alone 3. Pain Management:Tylenol as needed  Robaxin as needed started on 5/20, conitnue  5/28 ?neuropathic pain LUE/LLE---begin trial of low dose gabapentin 100mg  qhs 4. Mood:Zoloft 100 mg daily, melatonin as needed. Provide emotional support -antipsychotic agents: N/A 5. Neuropsych: This patientiscapable of making decisions on herown behalf.  Discussed with Neuropsych - appreciate eval 6. Skin/Wound Care:Routine skin checks 7. Fluids/Electrolytes/Nutrition:Routine in and outs 8. Diabetes mellitus with hyperglycemia. Hemoglobin A1c 9.3.  Patient on Actos, Januvia 100 mg, Glucotrol XL5 mg daily,metformin 1000 mg twice daily prior to admission.   Currently onNovoLog 5units 3 times daily with meals, Levemir 12units twice daily.  Glucophage at 500mg  bid, increased to 1000 BID on 5/20  Diabetic teaching  Fairly controlled on 5/28    9. Hypertension. Cozaar 25 mg twice daily, increased to 50 on 5/24, changed to 75 BID on 5/27  Borderline 5/28 10. Hyperlipidemia. Lipitor 11. Obesity. BMI 33.19. Dietary follow-up 12. GERD. Protonix 13. Constipation: scheduled senna-s, encourage liquids, fruits, vegetables -dulcolax suppository/fleet enema prn  Adjust bowel meds as necessary 14. Cough: COVID negative -appears to be mostly pharyngeal, upper  airway -Mucinex DM 15.  Hyponatremia  Sodium 132 on 5/26, labs ordered for Monday  Continue to monitor 16.  Leukocytosis  WBCs 11.3 on 5/20, labs ordered for Monday  Continue to monitor  Afebrile  Continue to monitor 17. Aortic valve mass: discussed finding with patient and plan for outpatient follow-up/removal.  18. Cough: since current regimen not helping, cough is very bothersome to her, imaging is negative for infection, and dextromethorphan may be contributing to her fatigue, changed to Mucinex to 51mL TID guaifensin-codeine.   5/28-some improvement?  LOS: 11 days A FACE TO [place] 05/10/2021, 12:13 PM

## 2021-05-11 LAB — GLUCOSE, CAPILLARY
Glucose-Capillary: 128 mg/dL — ABNORMAL HIGH (ref 70–99)
Glucose-Capillary: 128 mg/dL — ABNORMAL HIGH (ref 70–99)
Glucose-Capillary: 57 mg/dL — ABNORMAL LOW (ref 70–99)
Glucose-Capillary: 115 mg/dL — ABNORMAL HIGH (ref 70–99)
Glucose-Capillary: 184 mg/dL — ABNORMAL HIGH (ref 70–99)

## 2021-05-11 NOTE — Progress Notes (Signed)
Hypoglycemic Event  CBG: 57   Treatment: patient ate cookie and 8oz soda  Symptoms: None  Follow-up CBG: Time: 1752 CBG Result:115  Possible Reasons for Event: Medication regimen: insulin scheduled + sliding scale  Comments/MD notified:Yes    Cathey Endow

## 2021-05-11 NOTE — Progress Notes (Signed)
[place] PHYSICAL MEDICINE & REHABILITATION PROGRESS NOTE  Subjective/Complaints: Pt slept better last night. Left arm didn't bother her as much  ROS: Patient denies fever, rash, sore throat, blurred vision, nausea, vomiting, diarrhea, cough, shortness of breath or chest pain, joint or back pain, headache, or mood change.    Objective: Vital Signs: Blood pressure (!) 166/67, pulse 61, temperature 97.9 F (36.6 C), resp. rate 18, SpO2 94 %. No results found. No results for input(s): WBC, HGB, HCT, PLT in the last 72 hours. No results for input(s): NA, K, CL, CO2, GLUCOSE, BUN, CREATININE, CALCIUM in the last 72 hours.  Intake/Output Summary (Last 24 hours) at 05/11/2021 0920 Last data filed at 05/11/2021 0725 Gross per 24 hour  Intake 1440 ml  Output --  Net 1440 ml        Physical Exam: BP (!) 166/67   Pulse 61   Temp 97.9 F (36.6 C)   Resp 18   SpO2 94%   Constitutional: No distress . Vital signs reviewed. HEENT: EOMI, oral membranes moist Neck: supple Cardiovascular: RRR without murmur. No JVD    Respiratory/Chest: CTA Bilaterally without wheezes or rales. Normal effort    GI/Abdomen: BS +, non-tender, non-distended Ext: no clubbing, cyanosis, or edema Psych: pleasant and cooperative. Musc: LUE less tender with palpation and PROM, no jt swelling, discoloration Neuro: Alert Follows commands.  Left inattention Fair insight and awareness.  Motor:LUE 4-4+/5 prox to distal with mild apraxia LLE: 4-/5 prox to distal, improving  Assessment/Plan: 1. Functional deficits which require 3+ hours per day of interdisciplinary therapy in a comprehensive inpatient rehab setting.  Physiatrist is providing close team supervision and 24 hour management of active medical problems listed below.  Physiatrist and rehab team continue to assess barriers to discharge/monitor patient progress toward functional and medical goals   Care Tool:  Bathing    Body parts bathed by  patient: Chest,Abdomen,Right upper leg,Left upper leg,Face,Left arm,Front perineal area,Buttocks,Right arm,Right lower leg,Left lower leg   Body parts bathed by helper: Right lower leg,Left lower leg     Bathing assist Assist Level: Contact Guard/Touching assist     Upper Body Dressing/Undressing Upper body dressing   What is the patient wearing?: Pull over shirt    Upper body assist Assist Level: Supervision/Verbal cueing    Lower Body Dressing/Undressing Lower body dressing      What is the patient wearing?: Underwear/pull up,Pants     Lower body assist Assist for lower body dressing: Moderate Assistance - Patient 50 - 74%     Toileting Toileting    Toileting assist Assist for toileting: Moderate Assistance - Patient 50 - 74%     Transfers Chair/bed transfer  Transfers assist     Chair/bed transfer assist level: Minimal Assistance - Patient > 75%     Locomotion Ambulation   Ambulation assist      Assist level: Minimal Assistance - Patient > 75% Assistive device: Walker-rolling Max distance: 15   Walk 10 feet activity   Assist  Walk 10 feet activity did not occur: Safety/medical concerns  Assist level: Minimal Assistance - Patient > 75% Assistive device: Walker-rolling   Walk 50 feet activity   Assist Walk 50 feet with 2 turns activity did not occur: Safety/medical concerns  Assist level: Minimal Assistance - Patient > 75% Assistive device: Walker-rolling    Walk 150 feet activity   Assist Walk 150 feet activity did not occur: Safety/medical concerns         Walk 10  feet on uneven surface  activity   Assist Walk 10 feet on uneven surfaces activity did not occur: Safety/medical concerns         Wheelchair     Assist Will patient use wheelchair at discharge?: No   Wheelchair activity did not occur: N/A         Wheelchair 50 feet with 2 turns activity    Assist    Wheelchair 50 feet with 2 turns activity did not  occur: N/A       Wheelchair 150 feet activity     Assist  Wheelchair 150 feet activity did not occur: N/A        Medical Problem List and Plan: 1.Left side hemiparesis to right thalamic infarction. Status post mechanical thrombectomy of right PCA P2 occlusion 04/19/2021. Status post loop recorder  Continue CIR  WHO nightly  Updated son on 5/26. 2. Antithrombotics: -DVT/anticoagulation:SCDs -antiplatelet therapy: Aspirin 81 mg daily, Plavix 75 mg dailyx3 weeks then aspirin alone 3. Pain Management:Tylenol as needed  Robaxin as needed started on 5/20, conitnue  5/28 ?neuropathic pain LUE/LLE---began trial of low dose gabapentin 100mg  qhs  5/29 pt reported improvement today--titrate up if needed 4. Mood:Zoloft 100 mg daily, melatonin as needed. Provide emotional support -antipsychotic agents: N/A 5. Neuropsych: This patientiscapable of making decisions on herown behalf.  Discussed with Neuropsych - appreciate eval 6. Skin/Wound Care:Routine skin checks 7. Fluids/Electrolytes/Nutrition:Routine in and outs 8. Diabetes mellitus with hyperglycemia. Hemoglobin A1c 9.3.  Patient on Actos, Januvia 100 mg, Glucotrol XL5 mg daily,metformin 1000 mg twice daily prior to admission.   Currently onNovoLog 5units 3 times daily with meals, Levemir 12units twice daily.  Glucophage at 500mg  bid, increased to 1000 BID on 5/20  Diabetic teaching  Fairly controlled on 5/29    9. Hypertension. Cozaar 25 mg twice daily, increased to 50 on 5/24, changed to 75 BID on 5/27  Borderline 5/28 10. Hyperlipidemia. Lipitor 11. Obesity. BMI 33.19. Dietary follow-up 12. GERD. Protonix 13. Constipation: scheduled senna-s, encourage liquids, fruits, vegetables -dulcolax suppository/fleet enema prn  Adjust bowel meds as necessary 14. Cough: COVID negative -appears to be mostly pharyngeal, upper airway -Mucinex  DM 15.  Hyponatremia  Sodium 132 on 5/26, labs ordered for Monday  Continue to monitor 16.  Leukocytosis  WBCs 11.3 on 5/20, labs ordered for Monday  Continue to monitor  Afebrile  Continue to monitor 17. Aortic valve mass: discussed finding with patient and plan for outpatient follow-up/removal.  18. Cough: since current regimen not helping, cough is very bothersome to her, imaging is negative for infection, and dextromethorphan may be contributing to her fatigue, changed to Mucinex to 40mL TID guaifensin-codeine.   5/28-29 appears improved  LOS: 12 days A FACE TO [place] 05/11/2021, 9:20 AM

## 2021-05-11 NOTE — Progress Notes (Signed)
Occupational Therapy Session Note  Patient Details  Name: [name] MRN: [id number] Date of Birth: [birth year]  Today's Date: 05/11/2021 OT Individual Time: 6147-0929 OT Individual Time Calculation (min): 40 min    Short Term Goals: Week 1:  OT Short Term Goal 1 (Week 1): pt will complete squat pivot transfers with mod A. OT Short Term Goal 2 (Week 1): Pt will be able to hold static stand with min A. OT Short Term Goal 3 (Week 1): Pt will don shirt with min A OT Short Term Goal 4 (Week 1): Pt will be able to don pants over feet with min A. OT Short Term Goal 5 (Week 1): Pt will be able to pull pants over hips with mod A  Skilled Therapeutic Interventions/Progress Updates:   Pt received supine, calling out to use the bathroom. No c/o pain. Bed mobility to EOB with supervision, pt with LUE inattention, requiring cueing to locate LUE flexed behind her and under hip. Pt completed stand pivot transfer to the w/c with mod A toward the L. She was unable to maintain L grasp on w/c with visual attention distraction. Pt completed another stand pivot transfer to the toilet toward the L with mod A and premature descent, resulting in unsafe landing and OT intervention required. Pt voided urine and then stood for peri hygiene with min A. Pt completed transfer back to w/c and was wheeled to the sink. She completed hand hygiene with no cueing required for integration of LUE, and supervision overall. Pt was taken to the therapy gym. Pt completed stand pivot transfer using RW with min A to the mat. She completed functional reaching task in standing with R and LUE reaching toward mirror for visual cueing. She required min-mod facilitation for L hand prehension and intrinsic hand strength when grasping for resistive clothespins. She required several seated rest breaks between activities. Pt returned to her room and was left supine with all needs met. Bed alarm set.   Therapy Documentation Precautions:   Precautions Precautions: Fall Precaution Comments: poor L side sensory awareness Restrictions Weight Bearing Restrictions: Yes   Therapy/Group: Individual Therapy  Curtis Sites 05/11/2021, 6:42 AM

## 2021-05-12 LAB — CBC WITH DIFFERENTIAL/PLATELET
Abs Immature Granulocytes: 0.04 10*3/uL (ref 0.00–0.07)
Basophils Absolute: 0.1 10*3/uL (ref 0.0–0.1)
Basophils Relative: 1 %
Eosinophils Absolute: 0.4 10*3/uL (ref 0.0–0.5)
Eosinophils Relative: 5 %
HCT: 37 % (ref 36.0–46.0)
Hemoglobin: 12.4 g/dL (ref 12.0–15.0)
Immature Granulocytes: 1 %
Lymphocytes Relative: 17 %
Lymphs Abs: 1.3 10*3/uL (ref 0.7–4.0)
MCH: 28.8 pg (ref 26.0–34.0)
MCHC: 33.5 g/dL (ref 30.0–36.0)
MCV: 85.8 fL (ref 80.0–100.0)
Monocytes Absolute: 0.5 10*3/uL (ref 0.1–1.0)
Monocytes Relative: 7 %
Neutro Abs: 5.2 10*3/uL (ref 1.7–7.7)
Neutrophils Relative %: 69 %
Platelets: 284 10*3/uL (ref 150–400)
RBC: 4.31 MIL/uL (ref 3.87–5.11)
RDW: 13.2 % (ref 11.5–15.5)
WBC: 7.5 10*3/uL (ref 4.0–10.5)
nRBC: 0 % (ref 0.0–0.2)

## 2021-05-12 LAB — BASIC METABOLIC PANEL
Anion gap: 8 (ref 5–15)
BUN: 12 mg/dL (ref 8–23)
CO2: 28 mmol/L (ref 22–32)
Calcium: 8.9 mg/dL (ref 8.9–10.3)
Chloride: 94 mmol/L — ABNORMAL LOW (ref 98–111)
Creatinine, Ser: 1.07 mg/dL — ABNORMAL HIGH (ref 0.44–1.00)
GFR, Estimated: 57 mL/min — ABNORMAL LOW (ref >60)
Glucose, Bld: 153 mg/dL — ABNORMAL HIGH (ref 70–99)
Potassium: 4.2 mmol/L (ref 3.5–5.1)
Sodium: 130 mmol/L — ABNORMAL LOW (ref 135–145)

## 2021-05-12 LAB — GLUCOSE, CAPILLARY
Glucose-Capillary: 148 mg/dL — ABNORMAL HIGH (ref 70–99)
Glucose-Capillary: 67 mg/dL — ABNORMAL LOW (ref 70–99)
Glucose-Capillary: 138 mg/dL — ABNORMAL HIGH (ref 70–99)
Glucose-Capillary: 125 mg/dL — ABNORMAL HIGH (ref 70–99)
Glucose-Capillary: 124 mg/dL — ABNORMAL HIGH (ref 70–99)

## 2021-05-12 MED ORDER — INSULIN ASPART 100 UNIT/ML IJ SOLN
3.0000 [IU] | Freq: Three times a day (TID) | INTRAMUSCULAR | Status: DC
  Administered 2021-05-12 – 2021-05-26 (×34): 3 [IU] via SUBCUTANEOUS

## 2021-05-12 MED ORDER — INSULIN DETEMIR 100 UNIT/ML ~~LOC~~ SOLN
11.0000 [IU] | Freq: Two times a day (BID) | SUBCUTANEOUS | Status: DC
  Administered 2021-05-12 – 2021-05-15 (×6): 11 [IU] via SUBCUTANEOUS
  Filled 2021-05-12 (×7): qty 0.11

## 2021-05-12 NOTE — Progress Notes (Signed)
Physical Therapy Session Note  Patient Details  Name: [name] MRN: [id number] Date of Birth: 12-Jan-1956  Today's Date: 05/12/2021 PT Individual Time: [phone] and 1705-1730 PT Individual Time Calculation (min): 75 min and 25 min   Short Term Goals: Week 2:  PT Short Term Goal 1 (Week 2): Pt will complete bed mobility with CGA PT Short Term Goal 2 (Week 2): Pt will complete bed<>chair transfers with CGA and LRAD PT Short Term Goal 3 (Week 2): Pt will ambulate 89ft with modA and LRAD  Skilled Therapeutic Interventions/Progress Updates:    Session 1: Pt received supine in bed reporting she is "exhausted" from having to go to/from bathroom 2x this AM for continent voiding. Despite this, pt agreeable to therapy session. Supine vitals: BP 162/78 (MAP 102), HR 77bpm Supine>sitting R EOB, HOB flat and not using bedrail, with supervision and cuing for L UE management as pt often rolling her wrist into excessive flexion in WBing position. L stand pivot EOB>w/c, no AD, with mod assist for balance due to strong L lean - pt demos good ability to step L LE during this transfer. Sitting in w/c at sink completed oral care with cuing for L UE involvement into the task for NMR.  Transported to/from gym in w/c for time management and energy conservation. Gait training 36ft 2x using RW with min/mod assist for balance and AD management - facilitation for increased hip extension for upright posture and cuing for increased L LE hip/knee flexion for foot clearance during swing phase resulting in improved step length. Vitals after 1st walk: BP 155/76 (MAP 101), HR 64bpm Gait training 40ft 3x, L UE support around thearpist's shoulders and holding R hand but no AD, with min/mod assist for balance - pt verbalizes "left" "right" to focus on her stepping - demos improved upright posture and increased L LE step length compared to with RW but requires increased assist for balance. Repeated sit<>stands to/from EOM x10reps  with heavy min assist for balance - pt demos posterior lean, L LE intermittently kicking forwards as pt activates quads prior to putting weight down through L LE , and cuing for pushing up with L UE for NMR. Patient participated in [facility] and demonstrates significantly increased fall risk as noted by score of  6/56.  (<36= high risk for falls, close to 100%; 37-45 significant >80%; 46-51 moderate >50%; 52-55 lower >25%). Pt unable to maintain static standing balance without assist until mirror feedback provided.  MD in/out for morning assessment. Stand pivot EOM>w/c with min/mod assist for balance. Transported back to room and pt requesting to return to bed to rest until next session. R stand pivot w/c>EOB, no AD, with min assist for balance. Sit>supine supervision with cuing for L hemibody attention. Pt left supie in bed with needs in reach, bed alarm on, and L UE therapeutically positioned.   Session 2: Pt received supine in bed and agreeable to therapy session. Reports need to use bathroom. Supine>sitting R EOB, HOB partially elevated and using bedrail, with CGA for safety. Upon sitting EOB, pt reports low back with L posterior hip/sacral/PSIS shooting/radicular pain - pt repositioned in bed to off-weight that side with some improvement in symptoms. Sit>stand EOB>RW with min assist for balance and cuing for L hand placement on/off RW orthotic. Gait training ~56ft into bathroom using RW with mod assist for balance and AD management due to L lean and pt's L hand sliding off RW orthotic 3x during gait requiring assist to maintain  balance and cuing to attend to that side and safely reposition hand onto RW - continues to demo decreased L LE hip/knee flexion for foot clearance during swing resulting in decreased step length. Standing with mod assist for balance due to L lean, performed LB clothing management with min assist. Continent of bladder - standing peri-care with mod assist for balance due to L  lean and pt's L foot sliding too far forward while coming to stand - requires cuing to reposition L foot backwards and laterally for improved BOS to improve balance. Gait training ~73ft to sink using RW with mod assist for balance and AD management - therapist manually facilitating L hand grasp on AD orthotic at this time to prevent it from slipping off. Standing with mod assist for balance due to L lean and poor sustained L knee extension while performing hand hygiene with max cuing for increased integration of L UE into task for NMR. Gait training ~67ft using RW out of room and back to EOB with min/mod assist for balance - continued to manually facilitate L grasp on RW orthotic, cued for increased L LE foot clearance during swing, and increased hip extension during stance for improved upright posture. Sit>supine supervision and increased time for L hemibody management - due to L inattention pt sat on her L hand requiring cuing to correct. Pt left supine in bed with HOB maximally elevated, needs in reach, meal tray set-up, and RN present.   Therapy Documentation Precautions:  Precautions Precautions: Fall Precaution Comments: poor L side sensory awareness Restrictions Weight Bearing Restrictions: No  Pain:  Session 1: Reports only L arm "soreness" from "working it" in the room on her own but no complaints of pain. Applied K-pad heat at end of session for pain management.  Session 2: Upon sitting EOB reported back pain with radicular pain in L posterior hip - repositioned for pain management and no further reports of pain during session.  Balance: Standardized Balance Assessment Standardized Balance Assessment: Berg Balance Test Berg Balance Test Sit to Stand: Needs minimal aid to stand or to stabilize (also uses backs of legs against mat with min asssist) Standing Unsupported: Unable to stand 30 seconds unassisted (repeated posterior LOB requiring assist to maintian upright) Sitting with Back  Unsupported but Feet Supported on Floor or Stool: Able to sit 2 minutes under supervision Stand to Sit: Sits independently, has uncontrolled descent Transfers: Needs one person to assist Standing Unsupported with Eyes Closed: Needs help to keep from falling Standing Ubsupported with Feet Together: Needs help to attain position and unable to hold for 15 seconds From Standing, Reach Forward with Outstretched Arm: Loses balance while trying/requires external support From Standing Position, Pick up Object from Floor: Unable to try/needs assist to keep balance From Standing Position, Turn to Look Behind Over each Shoulder: Needs assist to keep from losing balance and falling Turn 360 Degrees: Needs assistance while turning Standing Unsupported, Alternately Place Feet on Step/Stool: Needs assistance to keep from falling or unable to try Standing Unsupported, One Foot in Front: Loses balance while stepping or standing Standing on One Leg: Unable to try or needs assist to prevent fall Total Score: 6  Therapy/Group: Individual Therapy  Tawana Scale , PT, DPT, CSRS  05/12/2021, 7:53 AM

## 2021-05-12 NOTE — Progress Notes (Signed)
PHYSICAL MEDICINE & REHABILITATION PROGRESS NOTE  Subjective/Complaints:  Pt reports pain still in L forearm and L hamstrings-  Esp after therapy, tight/painful.   LBM this AM Slept well o/n.  BP 180s/78 this AM- but was before got BP meds- BP down to 155/76 this AM with therapy.     ROS:  Pt denies SOB, abd pain, CP, N/V/C/D, and vision changes     Objective: Vital Signs: Blood pressure (!) 180/78, pulse 68, temperature 97.7 F (36.5 C), resp. rate 20, SpO2 98 %. No results found. Recent Labs    05/12/21 0939  WBC 7.5  HGB 12.4  HCT 37.0  PLT 284   Recent Labs    05/12/21 0939  NA 130*  K 4.2  CL 94*  CO2 28  GLUCOSE 153*  BUN 12  CREATININE 1.07*  CALCIUM 8.9    Intake/Output Summary (Last 24 hours) at 05/12/2021 1103 Last data filed at 05/12/2021 0725 Gross per 24 hour  Intake 1320 ml  Output --  Net 1320 ml        Physical Exam: BP (!) 180/78 (BP Location: Right Arm)   Pulse 68   Temp 97.7 F (36.5 C)   Resp 20   SpO2 98%      General: awake, alert, appropriate, sitting on mat in gym with PT;  NAD HENT: conjugate gaze; oropharynx moist CV: regular rate; no JVD Pulmonary: CTA B/L; no W/R/R- good air movement GI: soft, NT, ND, (+)BS Psychiatric: appropriate; interactive Neurological: alert Musc: LUE less tender with palpation and PROM, no jt swelling, discoloration- no change- no tightness of the muscle; L hamstring a little tight/like hasn't stretched Neuro: Alert Follows commands.  Left inattention Fair insight and awareness.  Motor:LUE 4-4+/5 prox to distal with mild apraxia LLE: 4-/5 prox to distal, improving  Assessment/Plan: 1. Functional deficits which require 3+ hours per day of interdisciplinary therapy in a comprehensive inpatient rehab setting.  Physiatrist is providing close team supervision and 24 hour management of active medical problems listed below.  Physiatrist and rehab team continue to assess barriers  to discharge/monitor patient progress toward functional and medical goals   Care Tool:  Bathing    Body parts bathed by patient: Chest,Abdomen,Right upper leg,Left upper leg,Face,Left arm,Front perineal area,Buttocks,Right arm,Right lower leg,Left lower leg   Body parts bathed by helper: Right lower leg,Left lower leg     Bathing assist Assist Level: Contact Guard/Touching assist     Upper Body Dressing/Undressing Upper body dressing   What is the patient wearing?: Pull over shirt    Upper body assist Assist Level: Supervision/Verbal cueing    Lower Body Dressing/Undressing Lower body dressing      What is the patient wearing?: Underwear/pull up,Pants     Lower body assist Assist for lower body dressing: Moderate Assistance - Patient 50 - 74%     Toileting Toileting    Toileting assist Assist for toileting: Moderate Assistance - Patient 50 - 74%     Transfers Chair/bed transfer  Transfers assist     Chair/bed transfer assist level: Minimal Assistance - Patient > 75%     Locomotion Ambulation   Ambulation assist      Assist level: Minimal Assistance - Patient > 75% Assistive device: Walker-rolling Max distance: 15   Walk 10 feet activity   Assist  Walk 10 feet activity did not occur: Safety/medical concerns  Assist level: Minimal Assistance - Patient > 75% Assistive device: Walker-rolling   Walk 50 feet activity  Assist Walk 50 feet with 2 turns activity did not occur: Safety/medical concerns  Assist level: Minimal Assistance - Patient > 75% Assistive device: Walker-rolling    Walk 150 feet activity   Assist Walk 150 feet activity did not occur: Safety/medical concerns         Walk 10 feet on uneven surface  activity   Assist Walk 10 feet on uneven surfaces activity did not occur: Safety/medical concerns         Wheelchair     Assist Will patient use wheelchair at discharge?: No   Wheelchair activity did not occur:  N/A         Wheelchair 50 feet with 2 turns activity    Assist    Wheelchair 50 feet with 2 turns activity did not occur: N/A       Wheelchair 150 feet activity     Assist  Wheelchair 150 feet activity did not occur: N/A        Medical Problem List and Plan: 1.Left side hemiparesis to right thalamic infarction. Status post mechanical thrombectomy of right PCA P2 occlusion 04/19/2021. Status post loop recorder  Continue CIR  WHO nightly  Updated son on 5/26.  5/30- con't PT, and OT 2. Antithrombotics: -DVT/anticoagulation:SCDs -antiplatelet therapy: Aspirin 81 mg daily, Plavix 75 mg dailyx3 weeks then aspirin alone 3. Pain Management:Tylenol as needed  Robaxin as needed started on 5/20, conitnue  5/28 ?neuropathic pain LUE/LLE---began trial of low dose gabapentin 100mg  qhs  5/29 pt reported improvement today--titrate up if needed  5/30- pain slightly better- con't regimen- also suggested stretch of L hamstring.  4. Mood:Zoloft 100 mg daily, melatonin as needed. Provide emotional support -antipsychotic agents: N/A 5. Neuropsych: This patientiscapable of making decisions on herown behalf.  Discussed with Neuropsych - appreciate eval 6. Skin/Wound Care:Routine skin checks 7. Fluids/Electrolytes/Nutrition:Routine in and outs 8. Diabetes mellitus with hyperglycemia. Hemoglobin A1c 9.3.  Patient on Actos, Januvia 100 mg, Glucotrol XL5 mg daily,metformin 1000 mg twice daily prior to admission.   Currently onNovoLog 5units 3 times daily with meals, Levemir 12units twice daily.  Glucophage at 500mg  bid, increased to 1000 BID on 5/20  Diabetic teaching  Fairly controlled on 5/29   5/30- BGs 57-184- only 1 BG >150- will decrease Levemir to 11 units BID since had a BG of 57 9. Hypertension. Cozaar 25 mg twice daily, increased to 50 on 5/24, changed to 75 BID on 5/27  Borderline 5/28  5/30- BP this AM 180/78- before AM  meds- is 150s/70s after meds- BP usually 259D/638V systolic- pulse/HR borderline low, so no Bblockers- will monitor 10. Hyperlipidemia. Lipitor 11. Obesity. BMI 33.19. Dietary follow-up 12. GERD. Protonix 13. Constipation: scheduled senna-s, encourage liquids, fruits, vegetables -dulcolax suppository/fleet enema prn  Adjust bowel meds as necessary 14. Cough: COVID negative -appears to be mostly pharyngeal, upper airway -Mucinex DM 15.  Hyponatremia  Sodium 132 on 5/26, labs ordered for Monday  5/30- Na 130- running 130-135- will monitor  Continue to monitor 16.  Leukocytosis  WBCs 11.3 on 5/20, labs ordered for Monday  5/30- WBC 7.5- con't to monitor  Continue to monitor  Afebrile  Continue to monitor 17. Aortic valve mass: discussed finding with patient and plan for outpatient follow-up/removal.  18. Cough: since current regimen not helping, cough is very bothersome to her, imaging is negative for infection, and dextromethorphan may be contributing to her fatigue, changed to Mucinex to 54mL TID guaifensin-codeine.   5/28-29 appears improved  LOS: 13 days A FACE  TO FACE EVALUATION WAS PERFORMED  Maziyah Vessel 05/12/2021, 11:03 AM

## 2021-05-12 NOTE — Progress Notes (Signed)
Hypoglycemic Event  CBG: 67  Treatment: 8 oz juice/soda  Symptoms: None  Follow-up CBG: Time:1307 CBG Result:138  Possible Reasons for Event: Medication regimen: SS insulin + meal coverage  Comments/MD notified:MD notified. Orders for meal coverage scheduled insulin modified/decreased as well as long acting insulin decreased.     Cathey Endow

## 2021-05-12 NOTE — Progress Notes (Signed)
Slept good. PRN tylenol and melatonin given at 2139. Bipap at HS. [name] A

## 2021-05-12 NOTE — Progress Notes (Signed)
Occupational Therapy Session Note  Patient Details  Name: [name] MRN: [id number] Date of Birth: [birth year]  Today's Date: 05/12/2021 OT Individual Time: 1300-1349 OT Individual Time Calculation (min): 49 min    Short Term Goals: Week 1:  OT Short Term Goal 1 (Week 1): pt will complete squat pivot transfers with mod A. OT Short Term Goal 2 (Week 1): Pt will be able to hold static stand with min A. OT Short Term Goal 3 (Week 1): Pt will don shirt with min A OT Short Term Goal 4 (Week 1): Pt will be able to don pants over feet with min A. OT Short Term Goal 5 (Week 1): Pt will be able to pull pants over hips with mod A  Skilled Therapeutic Interventions/Progress Updates:    Pt in bed finishing up with nursing, no c/o pain, requesting to shower.  Pt completed supine to sit with CGA.  Stand pivot to w/c using RW with left hand walker-splint needing min assist to prevent premature descent and safe LLE placement due to impaired proprioception.  Pt completed stand pivot w/c to 3 in 1 commode using grab bar with min assist.    Pt ambulated 3 steps with pivot from toilet to shower bench requiring min assist using grab bar.  UB bathing and dressing completed with supervision needing frequent VCs to increase use of LUE functionally.  Pt bathed and dressed LB using long handled sponge with min assist to thread pants and underwear over LLE and during standing portion to wash periarea.  Stand pivot with min assist using RW shower bench to w/c and then stand pivot w/c to EOB also with min assist.Frequent VCs needed throughout session to promote increased attention to LUE and LLE to improve placement and safety during functional and mobility tasks.  Sit to supine with supervision, call bell in reach, bed alarm on.  Therapy Documentation Precautions:  Precautions Precautions: Fall Precaution Comments: poor L side sensory awareness Restrictions Weight Bearing Restrictions: No   Therapy/Group:  Individual Therapy  Ezekiel Slocumb 05/12/2021, 2:44 PM

## 2021-05-12 NOTE — Progress Notes (Signed)
Occupational Therapy Session Note  Patient Details  Name: [name] MRN: [id number] Date of Birth: Mar 07, 1956  Today's Date: 05/12/2021 OT Individual Time: 1410-1440 OT Individual Time Calculation (min): 30 min  Short Term Goals: Week 1:  OT Short Term Goal 1 (Week 1): pt will complete squat pivot transfers with mod A. OT Short Term Goal 2 (Week 1): Pt will be able to hold static stand with min A. OT Short Term Goal 3 (Week 1): Pt will don shirt with min A OT Short Term Goal 4 (Week 1): Pt will be able to don pants over feet with min A. OT Short Term Goal 5 (Week 1): Pt will be able to pull pants over hips with mod A  Skilled Therapeutic Interventions/Progress Updates:    Pt greeted in bed, ADL needs met, asking to return to bed at end of the session. Therefore tx was completed EOB, focus placed on bilateral UE/LE coordination and Lt NMR by seated "dancing" activities while listening to meaningful music. Guided her through bilateral involved exercises such as forward/backward rolls, reciprocal hand pumps, forward/overhead clapping, and also toe taps/heel raises. Pt required cues for Lt attention, engaged in Lt LE exercises per her pain tolerance. She was able to sing along to every song she selected. At end of session pt returned to bed, k-pad placed under Lt calf due to ongoing pain. She asked for the heat to be increased and OT did this. Notified NT and RN to check on pt in the next 15 minutes to assess skin integrity. Pt reported that she would just "kick it off" if k-pad became too hot. Left her with all needs within reach and bed alarm set.   Therapy Documentation Precautions:  Precautions Precautions: Fall Precaution Comments: poor L side sensory awareness Restrictions Weight Bearing Restrictions: No Vital Signs: Therapy Vitals Temp: 98.5 F (36.9 C) Pulse Rate: 64 Resp: 17 BP: (!) 143/67 Patient Position (if appropriate): Lying Oxygen Therapy SpO2: 97 % O2 Device:  Room Air ADL: ADL Eating: Set up Grooming: Setup Upper Body Bathing: Supervision/safety Where Assessed-Upper Body Bathing: Shower Lower Body Bathing: Minimal assistance Where Assessed-Lower Body Bathing: Shower Upper Body Dressing: Contact guard Where Assessed-Upper Body Dressing: Sitting at sink Lower Body Dressing: Moderate assistance Where Assessed-Lower Body Dressing: Sitting at sink,Standing at sink Toileting: Moderate assistance Toilet Transfer: Minimal assistance Toilet Transfer Method: Squat pivot Toilet Transfer Equipment: Energy manager: Environmental education officer Method: Education officer, environmental: Transfer tub bench,Grab bars      Therapy/Group: Individual Therapy  Hrithik Boschee A Zettie Gootee 05/12/2021, 3:44 PM

## 2021-05-13 ENCOUNTER — Inpatient Hospital Stay (HOSPITAL_COMMUNITY): Payer: 59

## 2021-05-13 DIAGNOSIS — N179 Acute kidney failure, unspecified: Secondary | ICD-10-CM

## 2021-05-13 DIAGNOSIS — M79605 Pain in left leg: Secondary | ICD-10-CM

## 2021-05-13 LAB — GLUCOSE, CAPILLARY
Glucose-Capillary: 147 mg/dL — ABNORMAL HIGH (ref 70–99)
Glucose-Capillary: 173 mg/dL — ABNORMAL HIGH (ref 70–99)
Glucose-Capillary: 90 mg/dL (ref 70–99)
Glucose-Capillary: 140 mg/dL — ABNORMAL HIGH (ref 70–99)
Glucose-Capillary: 109 mg/dL — ABNORMAL HIGH (ref 70–99)

## 2021-05-13 MED ORDER — HYDRALAZINE HCL 10 MG PO TABS
10.0000 mg | ORAL_TABLET | Freq: Every day | ORAL | Status: DC
  Administered 2021-05-13 – 2021-05-26 (×14): 10 mg via ORAL
  Filled 2021-05-13 (×14): qty 1

## 2021-05-13 MED ORDER — LOSARTAN POTASSIUM 50 MG PO TABS
50.0000 mg | ORAL_TABLET | Freq: Two times a day (BID) | ORAL | Status: DC
  Administered 2021-05-13 – 2021-05-27 (×28): 50 mg via ORAL
  Filled 2021-05-13 (×28): qty 1

## 2021-05-13 MED ORDER — METHOCARBAMOL 500 MG PO TABS
500.0000 mg | ORAL_TABLET | Freq: Two times a day (BID) | ORAL | Status: DC | PRN
  Administered 2021-05-13 – 2021-05-27 (×18): 500 mg via ORAL
  Filled 2021-05-13 (×21): qty 1

## 2021-05-13 NOTE — Progress Notes (Signed)
Physical Therapy Session Note  Patient Details  Name: [name] MRN: [id number] Date of Birth: 1956-07-05  Today's Date: 05/13/2021 PT Individual Time: 6606-3016 PT Individual Time Calculation (min): 33 min   Today's Date: 05/13/2021 PT Missed Time: 27 Minutes Missed Time Reason: MD hold (Comment) (concern for LLE DVT)  Short Term Goals: Week 1:  PT Short Term Goal 1 (Week 1): Pt will complete bed mobility with minA PT Short Term Goal 1 - Progress (Week 1): Met PT Short Term Goal 2 (Week 1): Pt will complete bed<>chair transfers with minA PT Short Term Goal 2 - Progress (Week 1): Met PT Short Term Goal 3 (Week 1): Pt will ambulate 67f with modA and LRAD PT Short Term Goal 3 - Progress (Week 1): Not met Week 2:  PT Short Term Goal 1 (Week 2): Pt will complete bed mobility with CGA PT Short Term Goal 2 (Week 2): Pt will complete bed<>chair transfers with CGA and LRAD PT Short Term Goal 3 (Week 2): Pt will ambulate 273fwith modA and LRAD  Skilled Therapeutic Interventions/Progress Updates:   Received pt supine in bed. Per NT, MD with concerns of new onset LLE DVT. Consulted with RN who stated that per Dr. PaPosey Prontopt to receive doppler today to rule out DVT; therefore no ROM or weight bearing to LLE. Pt c/o shooting pain in LLE with DF/PF and with questions regarding what a DVT is. Educated pt on signs/symptoms of DVT and reasoning behind avoiding ROM or WB; pt verbalized understanding. Pt reported urge to urinate; due to ROM and WB restrictions retrieved bedpan. Doffed underwear/pants with mod A on L side due to hemiparesis and rolled to get onto bedpan with supervision. Pt able to urinate and perform peri-care with supervision. Pt then performed the following exercises supine in bed with supervision and verbal cues for technique: -RLE marches 2x20 -RLE SLR 2x12 -RLE hip abduction 2x20 Concluded session with pt supine in bed, needs within reach, and bed alarm on. 27 minutes missed of  skilled physical therapy due to MD concern for LLE DVT.  Therapy Documentation Precautions:  Precautions Precautions: Fall Precaution Comments: poor L side sensory awareness Restrictions Weight Bearing Restrictions: No  Therapy/Group: Individual Therapy AnAlfonse AlpersT, DPT   05/13/2021, 7:14 AM

## 2021-05-13 NOTE — Progress Notes (Signed)
Occupational Therapy Weekly Progress Note  Patient Details  Name: [name] MRN: [id number] Date of Birth: [birth year]  Beginning of progress report period: Apr 30, 2021 End of progress report period: May 13, 2021  Today's Date: 05/13/2021 OT Individual Time: 1100-1200 OT Individual Time Calculation (min): 60 min    Patient has met 5 of 5 short term goals.  Pt has been making excellent progress but continues to have severely impaired L side proprioception which impedes her mobility. Pt is highly motivated and puts in excellent effort.   Patient continues to demonstrate the following deficits: abnormal tone, unbalanced muscle activation and decreased coordination, decreased attention to left and decreased standing balance, decreased postural control, hemiplegia and decreased balance strategies and therefore will continue to benefit from skilled OT intervention to enhance overall performance with BADL.  Patient progressing toward long term goals..  Continue plan of care.  OT Short Term Goals Week 1:  OT Short Term Goal 1 (Week 1): pt will complete squat pivot transfers with mod A. OT Short Term Goal 1 - Progress (Week 1): Met OT Short Term Goal 2 (Week 1): Pt will be able to hold static stand with min A. OT Short Term Goal 2 - Progress (Week 1): Met OT Short Term Goal 3 (Week 1): Pt will don shirt with min A OT Short Term Goal 3 - Progress (Week 1): Met OT Short Term Goal 4 (Week 1): Pt will be able to don pants over feet with min A. OT Short Term Goal 4 - Progress (Week 1): Met OT Short Term Goal 5 (Week 1): Pt will be able to pull pants over hips with mod A OT Short Term Goal 5 - Progress (Week 1): Met Week 2:  OT Short Term Goal 1 (Week 2): STGs = LTGs  Skilled Therapeutic Interventions/Progress Updates:    Pt received in bed. Therapy limited to bed level due to concerns about a DVT in LLE.  Pt did need to toilet so provided her with bed pan but was then unable to void. Worked  on LUE NMR with resisted exercises for sensory feedback.  Used theraband for resistance along with 1.5 lb wrist cuff for tricep extensions, pushing hand up to ceiling and cross body reaches.  Needed to use splint to avoid wrist flexion as pt unable to hold wrist in neutral.  Pt needed assistance to guide arm.  She continues to have great difficulty with control of arm. Resting in bed with all needs met.   Therapy Documentation Precautions:  Precautions Precautions: Fall Precaution Comments: poor L side sensory awareness Restrictions Weight Bearing Restrictions: No General: General PT Missed Treatment Reason: MD hold (Comment) (concern for LLE DVT) Pain:   c/o LLE calf pain (MD aware)  ADL: ADL Eating: Set up Grooming: Setup Upper Body Bathing: Supervision/safety Where Assessed-Upper Body Bathing: Shower Lower Body Bathing: Minimal assistance Where Assessed-Lower Body Bathing: Shower Upper Body Dressing: Contact guard Where Assessed-Upper Body Dressing: Sitting at sink Lower Body Dressing: Moderate assistance Where Assessed-Lower Body Dressing: Sitting at sink,Standing at sink Toileting: Moderate assistance Toilet Transfer: Minimal assistance Toilet Transfer Method: Squat pivot Toilet Transfer Equipment: Energy manager: Environmental education officer Method: Education officer, environmental: Transfer tub bench,Grab bars   Therapy/Group: Individual Therapy  Coates 05/13/2021, 12:56 PM

## 2021-05-13 NOTE — Progress Notes (Signed)
Lower extremity venous has been completed.   Preliminary results in CV Proc.   Abram Sander 05/13/2021 2:21 PM

## 2021-05-13 NOTE — Progress Notes (Signed)
[place] PHYSICAL MEDICINE & REHABILITATION PROGRESS NOTE  Subjective/Complaints: Patient seen laying in bed this AM.  She states she slept well overnight.  She complains of left calf pain.   ROS: Denies CP, SOB, N/V/D  Objective: Vital Signs: Blood pressure (!) 174/83, pulse 69, temperature 98 F (36.7 C), temperature source Oral, resp. rate 16, SpO2 95 %. No results found. Recent Labs    05/12/21 0939  WBC 7.5  HGB 12.4  HCT 37.0  PLT 284   Recent Labs    05/12/21 0939  NA 130*  K 4.2  CL 94*  CO2 28  GLUCOSE 153*  BUN 12  CREATININE 1.07*  CALCIUM 8.9    Intake/Output Summary (Last 24 hours) at 05/13/2021 0959 Last data filed at 05/13/2021 0718 Gross per 24 hour  Intake 1080 ml  Output --  Net 1080 ml        Physical Exam: BP (!) 174/83 (BP Location: Left Arm)   Pulse 69   Temp 98 F (36.7 C) (Oral)   Resp 16   SpO2 95%   Constitutional: No distress . Vital signs reviewed. HENT: Normocephalic.  Atraumatic. Eyes: EOMI. No discharge. Cardiovascular: No JVD.  RRR. Respiratory: Normal effort.  No stridor.  Bilateral clear to auscultation. GI: Non-distended.  BS +. Skin: Warm and dry.  Intact. Psych: Normal mood.  Normal behavior. Musc: No edema in extremities.  No tenderness in extremities. Neurological: Alert Musc: Left calf with tenderness, no edema + Homman's Neuro: Alert Follows commands.  Left inattention, some improvement Fair insight and awareness.  Motor:LUE 4-4+/5 prox to distal with mild apraxia LLE: 4-/5 prox to distal, stable  Assessment/Plan: 1. Functional deficits which require 3+ hours per day of interdisciplinary therapy in a comprehensive inpatient rehab setting.  Physiatrist is providing close team supervision and 24 hour management of active medical problems listed below.  Physiatrist and rehab team continue to assess barriers to discharge/monitor patient progress toward functional and medical goals   Care  Tool:  Bathing    Body parts bathed by patient: Chest,Abdomen,Right upper leg,Left upper leg,Face,Left arm,Front perineal area,Buttocks,Right arm,Right lower leg,Left lower leg   Body parts bathed by helper: Right lower leg,Left lower leg     Bathing assist Assist Level: Contact Guard/Touching assist     Upper Body Dressing/Undressing Upper body dressing   What is the patient wearing?: Pull over shirt    Upper body assist Assist Level: Supervision/Verbal cueing    Lower Body Dressing/Undressing Lower body dressing      What is the patient wearing?: Underwear/pull up,Pants     Lower body assist Assist for lower body dressing: Moderate Assistance - Patient 50 - 74%     Toileting Toileting    Toileting assist Assist for toileting: Moderate Assistance - Patient 50 - 74%     Transfers Chair/bed transfer  Transfers assist     Chair/bed transfer assist level: Moderate Assistance - Patient 50 - 74%     Locomotion Ambulation   Ambulation assist      Assist level: Moderate Assistance - Patient 50 - 74% Assistive device: Walker-rolling Max distance: 48ft   Walk 10 feet activity   Assist  Walk 10 feet activity did not occur: Safety/medical concerns  Assist level: Moderate Assistance - Patient - 50 - 74% Assistive device: Walker-rolling   Walk 50 feet activity   Assist Walk 50 feet with 2 turns activity did not occur: Safety/medical concerns  Assist level: Minimal Assistance - Patient > 75% Assistive  device: Walker-rolling    Walk 150 feet activity   Assist Walk 150 feet activity did not occur: Safety/medical concerns         Walk 10 feet on uneven surface  activity   Assist Walk 10 feet on uneven surfaces activity did not occur: Safety/medical concerns         Wheelchair     Assist Will patient use wheelchair at discharge?: No   Wheelchair activity did not occur: N/A         Wheelchair 50 feet with 2 turns  activity    Assist    Wheelchair 50 feet with 2 turns activity did not occur: N/A       Wheelchair 150 feet activity     Assist  Wheelchair 150 feet activity did not occur: N/A        Medical Problem List and Plan: 1.Left side hemiparesis to right thalamic infarction. Status post mechanical thrombectomy of right PCA P2 occlusion 04/19/2021. Status post loop recorder  Limit therapies to LLE today  WHO nightly 2. Antithrombotics: -DVT/anticoagulation:SCDs  LLE doppler ordered -antiplatelet therapy: Aspirin 81 mg daily, Plavix 75 mg dailyx3 weeks then aspirin alone 3. Pain Management:Tylenol as needed  Robaxin as needed started on 5/20, conitnue  Trial of low dose gabapentin 100mg  qhs  Improving control on 5/31 4. Mood:Zoloft 100 mg daily, melatonin as needed. Provide emotional support -antipsychotic agents: N/A 5. Neuropsych: This patientiscapable of making decisions on herown behalf.  Discussed with Neuropsych - appreciate eval 6. Skin/Wound Care:Routine skin checks 7. Fluids/Electrolytes/Nutrition:Routine in and outs 8. Diabetes mellitus with hyperglycemia. Hemoglobin A1c 9.3.  Patient on Actos, Januvia 100 mg, Glucotrol XL5 mg daily,metformin 1000 mg twice daily prior to admission.   Currently onNovoLog 5units 3 times daily with meals, Levemir 12units twice daily.  Glucophage at 500mg  bid, increased to 1000 BID on 5/20  Diabetic teaching  Levemir to 11 units BID on 5/30  Elevated on 5/31, monitor for trend 9. Hypertension. Cozaar 25 mg twice daily, increased to 50 on 5/24, changed to 75 BID on 5/27, decreased to 50 BID on 5/31  Hydralizine 10 qhs started on 5/31 10. Hyperlipidemia. Lipitor 11. Obesity. BMI 33.19. Dietary follow-up 12. GERD. Protonix 13. Constipation: scheduled senna-s, encourage liquids, fruits, vegetables -dulcolax suppository/fleet enema prn  Adjust bowel meds as  necessary 14. Cough: COVID negative -appears to be mostly pharyngeal, upper airway -Mucinex DM 15.  Hyponatremia  Sodium 130 on 5/30  Continue to monitor 16.  Leukocytosis: Resolved  Continue to monitor  Afebrile  Continue to monitor 17. Aortic valve mass: discussed finding with patient and plan for outpatient follow-up/removal.  18. Cough: since current regimen not helping, cough is very bothersome to her, imaging is negative for infection, and dextromethorphan may be contributing to her fatigue, changed to Mucinex to 70mL TID guaifensin-codeine.  19. AKI  Cr 1.07 on 5/30  Encourage fluids  LOS: 14 days A FACE TO FACE EVALUATION WAS PERFORMED  Lile Mccurley Lorie Phenix 05/13/2021, 9:59 AM

## 2021-05-13 NOTE — Progress Notes (Signed)
Speech Language Pathology Daily Session Note  Patient Details  Name: [name] MRN: [id number] Date of Birth: Sep 25, 1956  Today's Date: 05/13/2021 SLP Individual Time: 0815-0900 SLP Individual Time Calculation (min): 45 min  Short Term Goals: Week 2: SLP Short Term Goal 1 (Week 2): Patient will complete simulated medication management task with supervision A. SLP Short Term Goal 2 (Week 2): Patient will perform alternating attention tasks with minA cues to redirect. SLP Short Term Goal 3 (Week 2): Patient will complete financial/money management task with minA cues for accuracy.  Skilled Therapeutic Interventions: Skilled SLP intervention focused on cognition. She was able to recall conversation with MD prior to session on NWB with LLE. Pt answered moderately complex problem solving questions related to medications /medication list with supervision to min A cues. Alternating attention card sorting task of increased complexity completed with min A then cues faded to supervision. She increased processing speed once familiar with task. Pt left seated upright in bed with bed alarm set and call button within reach. Cont with therapy per plan of care.      Pain Pain Assessment Pain Scale: Faces Faces Pain Scale: No hurt  Therapy/Group: Individual Therapy  [name] [name] 05/13/2021, 8:57 AM

## 2021-05-13 NOTE — Plan of Care (Signed)
  Problem: RH Dressing Goal: LTG Patient will perform lower body dressing w/assist (OT) Description: LTG: Patient will perform lower body dressing with assist, with/without cues in positioning using equipment (OT) Flowsheets (Taken 05/13/2021 1251) LTG: Pt will perform lower body dressing with assistance level of: (LTG upgraded due to progress) Contact Guard/Touching assist Note: LTG upgraded due to progress   Problem: RH Toileting Goal: LTG Patient will perform toileting task (3/3 steps) with assistance level (OT) Description: LTG: Patient will perform toileting task (3/3 steps) with assistance level (OT)  Flowsheets (Taken 05/13/2021 1251) LTG: Pt will perform toileting task (3/3 steps) with assistance level: (LTG upgraded due to progress) Contact Guard/Touching assist Note: LTG upgraded due to progress

## 2021-05-13 NOTE — Progress Notes (Signed)
Occupational Therapy Session Note  Patient Details  Name: [name] MRN: [id number] Date of Birth: 26-Aug-1956  Today's Date: 05/13/2021 OT Individual Time: 1345-1415 OT Individual Time Calculation (min): 30 min    Short Term Goals: Week 2:  OT Short Term Goal 1 (Week 2): STGs = LTGs  Skilled Therapeutic Interventions/Progress Updates:    Pt supine in bed, reports left calf pain is mild currently.  Per MD orders, limit therapies to LLE today, pending doppler to rule out DVT.  Focus of session on left forearm, wrist, and hand resistive therex to promote increased proprioceptive input, left sided attention, and strength for improved independence with grasping RW and all self care tasks.  Pt initially attempted to grasp 3 lb free weight however consistently dropping, therefore OT applied coban wrap to facilitate successful grip.  Pt completed 3 x 15 reps wrist extension and flexion and forearm sup/pro (holding base of weight).  Pt required max VCs to increase pts attention to left arm during exercises for improved quality of movement due to pt frequently losing focus resulting in poor control of LUE.  Assessed proprioception of left digits, noting impairment and educated pt on results as well as importance of attending to left side.  Call bell in reach, bed alarm on at end of session.  Therapy Documentation Precautions:  Precautions Precautions: Fall Precaution Comments: poor L side sensory awareness Restrictions Weight Bearing Restrictions: No   Therapy/Group: Individual Therapy  Ezekiel Slocumb 05/13/2021, 3:13 PM

## 2021-05-14 LAB — GLUCOSE, CAPILLARY
Glucose-Capillary: 145 mg/dL — ABNORMAL HIGH (ref 70–99)
Glucose-Capillary: 158 mg/dL — ABNORMAL HIGH (ref 70–99)
Glucose-Capillary: 131 mg/dL — ABNORMAL HIGH (ref 70–99)
Glucose-Capillary: 116 mg/dL — ABNORMAL HIGH (ref 70–99)

## 2021-05-14 MED ORDER — AMLODIPINE BESYLATE 2.5 MG PO TABS
2.5000 mg | ORAL_TABLET | Freq: Every day | ORAL | Status: DC
  Administered 2021-05-14 – 2021-05-15 (×2): 2.5 mg via ORAL
  Filled 2021-05-14 (×2): qty 1

## 2021-05-14 NOTE — Progress Notes (Signed)
[place] PHYSICAL MEDICINE & REHABILITATION PROGRESS NOTE  Subjective/Complaints: Patient seen laying in bed this morning.  She states she slept well overnight.  She notes improvement in Pain and states she is feeling better overall.  ROS: Denies CP, SOB, N/V/D  Objective: Vital Signs: Blood pressure (!) 171/83, pulse 80, temperature (!) 97.5 F (36.4 C), temperature source Oral, resp. rate 18, SpO2 93 %. VAS [place] LOWER EXTREMITY VENOUS (DVT)  Result Date: 05/13/2021  Lower Venous DVT Study Patient Name:  LOYE REININGER  Date of Exam:   05/13/2021 Medical Rec #: [id number]        Accession #:    [id number] Date of Birth: Nov 20, 1956       Patient Gender: F Patient Age:   64Y Exam Location:  [facility] Procedure:      VAS [place] LOWER EXTREMITY VENOUS (DVT) Referring Phys: [id number] Cadence Haslam ANIL Mcdonald Reiling --------------------------------------------------------------------------------  Indications: Pain.  Comparison Study: no prior Performing Technologist: Abram Sander RVS  Examination Guidelines: A complete evaluation includes B-mode imaging, spectral Doppler, color Doppler, and power Doppler as needed of all accessible portions of each vessel. Bilateral testing is considered an integral part of a complete examination. Limited examinations for reoccurring indications may be performed as noted. The reflux portion of the exam is performed with the patient in reverse Trendelenburg.  +-----+---------------+---------+-----------+----------+--------------+ RIGHTCompressibilityPhasicitySpontaneityPropertiesThrombus Aging +-----+---------------+---------+-----------+----------+--------------+ CFV  Full           Yes      Yes                                 +-----+---------------+---------+-----------+----------+--------------+   +---------+---------------+---------+-----------+----------+--------------+ LEFT     CompressibilityPhasicitySpontaneityPropertiesThrombus Aging  +---------+---------------+---------+-----------+----------+--------------+ CFV      Full           Yes      Yes                                 +---------+---------------+---------+-----------+----------+--------------+ SFJ      Full                                                        +---------+---------------+---------+-----------+----------+--------------+ FV Prox  Full                                                        +---------+---------------+---------+-----------+----------+--------------+ FV Mid   Full                                                        +---------+---------------+---------+-----------+----------+--------------+ FV DistalFull                                                        +---------+---------------+---------+-----------+----------+--------------+ PFV  Full                                                        +---------+---------------+---------+-----------+----------+--------------+ POP      Full           Yes      Yes                                 +---------+---------------+---------+-----------+----------+--------------+ PTV      Full                                                        +---------+---------------+---------+-----------+----------+--------------+ PERO     Full                                                        +---------+---------------+---------+-----------+----------+--------------+     Summary: RIGHT: - No evidence of common femoral vein obstruction.  LEFT: - There is no evidence of deep vein thrombosis in the lower extremity.  - No cystic structure found in the popliteal fossa.  *See table(s) above for measurements and observations. Electronically signed by Deitra Mayo MD on 05/13/2021 at 4:14:33 PM.    Final    Recent Labs    05/12/21 0939  WBC 7.5  HGB 12.4  HCT 37.0  PLT 284   Recent Labs    05/12/21 0939  NA 130*  K 4.2  CL 94*  CO2 28  GLUCOSE 153*   BUN 12  CREATININE 1.07*  CALCIUM 8.9    Intake/Output Summary (Last 24 hours) at 05/14/2021 1108 Last data filed at 05/14/2021 0700 Gross per 24 hour  Intake 680 ml  Output --  Net 680 ml        Physical Exam: BP (!) 171/83 (BP Location: Right Arm)   Pulse 80   Temp (!) 97.5 F (36.4 C) (Oral)   Resp 18   SpO2 93%   Constitutional: No distress . Vital signs reviewed. HENT: Normocephalic.  Atraumatic. Eyes: EOMI. No discharge. Cardiovascular: No JVD.  RRR. Respiratory: Normal effort.  No stridor.  Bilateral clear to auscultation. GI: Non-distended.  BS +. Skin: Warm and dry.  Intact. Psych: Normal mood.  Normal behavior. Musc: No edema in extremities.  No tenderness in extremities. Neuro: Alert Follows commands.  Left inattention, some improvement Fair insight and awareness.  Motor:LUE 4-4+/5 prox to distal with mild apraxia LLE: 4-/5 prox to distal, improving  Assessment/Plan: 1. Functional deficits which require 3+ hours per day of interdisciplinary therapy in a comprehensive inpatient rehab setting.  Physiatrist is providing close team supervision and 24 hour management of active medical problems listed below.  Physiatrist and rehab team continue to assess barriers to discharge/monitor patient progress toward functional and medical goals   Care Tool:  Bathing    Body parts bathed by patient: Chest,Abdomen,Right upper leg,Left upper leg,Face,Left arm,Front perineal area,Buttocks,Right arm,Right lower leg,Left lower leg   Body parts  bathed by helper: Right lower leg,Left lower leg     Bathing assist Assist Level: Contact Guard/Touching assist     Upper Body Dressing/Undressing Upper body dressing   What is the patient wearing?: Pull over shirt    Upper body assist Assist Level: Supervision/Verbal cueing    Lower Body Dressing/Undressing Lower body dressing      What is the patient wearing?: Underwear/pull up,Pants     Lower body assist Assist  for lower body dressing: Moderate Assistance - Patient 50 - 74%     Toileting Toileting    Toileting assist Assist for toileting: Moderate Assistance - Patient 50 - 74%     Transfers Chair/bed transfer  Transfers assist     Chair/bed transfer assist level: Moderate Assistance - Patient 50 - 74%     Locomotion Ambulation   Ambulation assist      Assist level: Moderate Assistance - Patient 50 - 74% Assistive device: Walker-rolling Max distance: 18ft   Walk 10 feet activity   Assist  Walk 10 feet activity did not occur: Safety/medical concerns  Assist level: Moderate Assistance - Patient - 50 - 74% Assistive device: Walker-rolling   Walk 50 feet activity   Assist Walk 50 feet with 2 turns activity did not occur: Safety/medical concerns  Assist level: Minimal Assistance - Patient > 75% Assistive device: Walker-rolling    Walk 150 feet activity   Assist Walk 150 feet activity did not occur: Safety/medical concerns         Walk 10 feet on uneven surface  activity   Assist Walk 10 feet on uneven surfaces activity did not occur: Safety/medical concerns         Wheelchair     Assist Will patient use wheelchair at discharge?: No   Wheelchair activity did not occur: N/A         Wheelchair 50 feet with 2 turns activity    Assist    Wheelchair 50 feet with 2 turns activity did not occur: N/A       Wheelchair 150 feet activity     Assist  Wheelchair 150 feet activity did not occur: N/A        Medical Problem List and Plan: 1.Left side hemiparesis to right thalamic infarction. Status post mechanical thrombectomy of right PCA P2 occlusion 04/19/2021. Status post loop recorder  Resume therapies  Team conference today to discuss current and goals and coordination of care, home and environmental barriers, and discharge planning with nursing, case manager, and therapies. Please see conference note from today as well.   WHO  nightly 2. Antithrombotics: -DVT/anticoagulation:SCDs  LLE doppler negative for DVT -antiplatelet therapy: Aspirin 81 mg daily, Plavix 75 mg dailyx3 weeks then aspirin alone 3. Pain Management:Tylenol as needed  Robaxin as needed started on 5/20, conitnue  Continue low dose gabapentin 100mg  qhs  Controlled on 6/1 4. Mood:Zoloft 100 mg daily, melatonin as needed. Provide emotional support -antipsychotic agents: N/A 5. Neuropsych: This patientiscapable of making decisions on herown behalf.  Discussed with Neuropsych - appreciate eval 6. Skin/Wound Care:Routine skin checks 7. Fluids/Electrolytes/Nutrition:Routine in and outs 8. Diabetes mellitus with hyperglycemia. Hemoglobin A1c 9.3.  Patient on Actos, Januvia 100 mg, Glucotrol XL5 mg daily,metformin 1000 mg twice daily prior to admission.   Currently onNovoLog 5units 3 times daily with meals  Glucophage at 500mg  bid, increased to 1000 BID on 5/20  Diabetic teaching  Levemir to 11 units BID on 5/30  Slightly elevated on 6/1 9. Hypertension. Cozaar 25 mg twice  daily, increased to 50 on 5/24, changed to 75 BID on 5/27, decreased to 50 BID on 5/31  Norvasc 2.5 started on 6/1  Hydralizine 10 qhs started on 5/31, increased on 6/1 10. Hyperlipidemia. Lipitor 11. Obesity. BMI 33.19. Dietary follow-up 12. GERD. Protonix 13. Constipation: scheduled senna-s, encourage liquids, fruits, vegetables -dulcolax suppository/fleet enema prn  Adjust bowel meds as necessary 14. Cough: COVID negative -appears to be mostly pharyngeal, upper airway -Mucinex DM 15.  Hyponatremia  Sodium 130 on 5/30, will order labs within the week  Continue to monitor 16.  Leukocytosis: Resolved  Continue to monitor  Afebrile  Continue to monitor 17. Aortic valve mass: discussed finding with patient and plan for outpatient follow-up/removal.  18. Cough: since current regimen not  helping, cough is very bothersome to her, imaging is negative for infection, and dextromethorphan may be contributing to her fatigue, changed to Mucinex to 20mL TID guaifensin-codeine.  19. AKI  Cr 1.07 on 5/30, will order labs at the end of the week  Encourage fluids  LOS: 15 days A FACE TO FACE EVALUATION WAS PERFORMED  Rossanna Spitzley Lorie Phenix 05/14/2021, 11:08 AM

## 2021-05-14 NOTE — Progress Notes (Signed)
Occupational Therapy Session Note  Patient Details  Name: [name] MRN: [id number] Date of Birth: Aug 02, 1956  Today's Date: 05/14/2021 OT Individual Time: 0900-1000 OT Individual Time Calculation (min): 60 min    Short Term Goals: Week 2:  OT Short Term Goal 1 (Week 2): STGs = LTGs  Skilled Therapeutic Interventions/Progress Updates:    Pt in bed, reports soreness in left hand today, agreeable to taking a shower during OT session.  Pt completed supine to sit using bed rail with min assist and VC/TCs needed to bring LUE in safe functional position.  Pt ambulated using RW with min assist +2 requiring intermittent manual positioning of LLE and max VCs to facilitate increased left sided attention.  Pt completed toilet transfer and toileting completed with min assist.  Pt ambulated to shower bench requiring mod assist due to prematurely leaving RW and premature descent.  Re-educated pt with visual demonstration on safe transfer technique and RW management during stand pivot.  Pt doffed shirt with supervision.  Bathed UB seated with supervision and VCs to increase attention to LUE and functional use of LUE.  Pt bathed LB using long handled sponge with lateral leans to complete peri washing (questionnable thoroughness) with CGA.  After drying off, pt completed sit<>stand at stedy with min assist +2 to w/c.  Pt donned shirt with supervision. Demonstration and step by step problem solving cues provided in use of reacher for LB dressing.  Pt donned underwear/pants with min assist (to pull over left hip in standing).  OT provided pt with heelbow sleeve to left arm to protect skin due to noting pt with various bruises on arm likely secondary to ataxic movement resulting in bumping of extremity.  Call bell in reach at end of session.    Therapy Documentation Precautions:  Precautions Precautions: Fall Precaution Comments: poor L side sensory awareness Restrictions Weight Bearing Restrictions:  No   Therapy/Group: Individual Therapy  Ezekiel Slocumb 05/14/2021, 2:48 PM

## 2021-05-14 NOTE — Progress Notes (Signed)
Physical Therapy Session Note  Patient Details  Name: [name] MRN: [id number] Date of Birth: 1956-04-15  Today's Date: 05/14/2021 PT Individual Time: 1135-1205 and 8676-1950 PT Individual Time Calculation (min): 30 min and 54 min  Short Term Goals: Week 2:  PT Short Term Goal 1 (Week 2): Pt will complete bed mobility with CGA PT Short Term Goal 2 (Week 2): Pt will complete bed<>chair transfers with CGA and LRAD PT Short Term Goal 3 (Week 2): Pt will ambulate 12ft with modA and LRAD  Skilled Therapeutic Interventions/Progress Updates:   Session 1: MD cleared pt for all therapy as "L LE doppler negative for DVT."  Pt received supine in bed and agreeable to therapy session. Supine>sitting R EOB, HOB flat and not using bedrail, with supervision. L stand pivot EOB>w/c, no AD, with min assist for balance coming to stand and turning - cuing for R weight shift as pt starts to lean L.  Transported to/from gym in w/c for time management and energy conservation. Gait training ~103ft 3x with L UE support around therapist's shoulders and R HHA progressed to no R UE support - requires mod assist for balance and intermittently when not stepping forward fully with L LE would have minor anterior LOB - pt continues to demo poor L LE foot clearance lacking sufficient hip/knee flexion during swing with varying step lengths, continued cuing for increased hip/knee extension during stance and upright posture. Transported back to room in w/c. [name], SW present - discussed with pt recommendation for extended LOS as pt is demonstrating significant improvement in functional mobility this week and would benefit from additional time to focus on safety awareness and dynamic standing balance to increase independence with functional mobility and decrease risk for falls - pt in agreement with this plan. Pt left seated in w/c with needs in reach and seat belt alarm on.  Session 2: Pt received supine in bed with her daughter  present and pt agreeable to therapy session. Pt and daughter in agreement with updated D/C date of 6/14. Therapist educated pt's daughter on pt's L hemiparesis, L inattention, L hemibody impaired sensation, and impaired spatial orientation/awareness impacting her safety with functional mobility tasks although these are improving. Supine>sitting R EOB, HOB flat and not using bedrail, with supervision - pt demos improving attention to L UE positioning during this task. Sit<>stands with CGA/min assist for steadying/balance due to posterior lean upon coming to stand - cuing to scoot hips forward prior to coming to stand to improve balance. L stand pivot EOB>w/c with min assist for balance - cuing for upright posture and increased R/L weight shift to complete stepping.  Transported to/from gym in w/c for time management and energy conservation. Pt's daughter present to observe therapy session for the first half. Gait training ~43ft w/ L UE support around therapist's shoulders with min/mod assist for balance - continued cuing for increased L LE foot clearance and step length during swing along with increased L hip/knee extension during stance. Donned maxi-sky harness for safety while performing the following dynamic gait training tasks without UE support:  - 36ft forward ambulation x4 laps total - pt initially has L lateral trunk flexion/tilt during L stance phase that improves when therapist facilitating L hip stability during stance (anticipate due to impaired L hip abductor strength/activation), cuing for carryover - otherwise continues to demo decreased L LE hip/knee flexion for foot clearance during swing with decreased hip/knee extension during stance  *with fatigue pt will start to "sag" down  into crouched posutre with progressively worsening L LE knee flexion (this is exaggerated when L foot positioned slightly posterior to her BOS) requiring mod assist for safety and cuing to reposition L LE and return to  upright stance - R/L lateral side stepping with +2 HHA progressed to no UE support x2 laps total - cuing and facilitation for R/L weight shifting - cuing for increased L LE lateral side step distance and to sustain L hip/knee extension when stepping laterally with R LE  Pt requires seated rest break after every lap down/back in the maxi-sky harness (~43ft) - reports she recently took cough medicine and believes it is making her feel groggy and tired. Therapist educated pt on safety awareness of not taking medications that cause drowsiness prior to performing mobility tasks at home as it may increase her risk for falls (as is appropriate based on MD prescription). Doffed maxi-sky harness. Gait training ~18ft L UE support around therapist's shoulders with min assist for balance - pt demos significant improvement in L LE step length and sustained knee extension in stance after performing dynamic gait training in the maxi-sky harness. Transported back to room. Short distance ~63ft ambulatory transfer w/c>EOB, L UE support around therapist's shoulders, with min assist for balance - noted continued carryover of improved gait mecahnics. Sit>supine supervision. Pt left supine in bed with needs in reach, L LE therapeutically positioned on pillow with K-pad positioned under her calf for pain management, L UE therapeutically positioned on pillow, and bed alarm on.  Therapy Documentation Precautions:  Precautions Precautions: Fall Precaution Comments: poor L side sensory awareness Restrictions Weight Bearing Restrictions: No  Pain: Session 1: No reports of pain throughout session. States it feels good to walk.  Session 2: No reports of pain throughout session.  Therapy/Group: Individual Therapy  Tawana Scale , PT, DPT, CSRS  05/14/2021, 11:24 AM

## 2021-05-14 NOTE — Progress Notes (Signed)
Patient ID: [name], female   DOB: [birth year], 65 y.o.   MRN: [id number]  Team feels pt would benefit from staying longer to meet her goals. MD is on board with and Dc date changed to 6/14. Pt and daughter aware of team conference progress and new date and both pleased with. Pt wants to be as good as she can be so does not have to ask for assistance from daughter's boyfriend who will be there when daughter is working. Daughter aware will need to come in for education prior to Mom's discharge.

## 2021-05-14 NOTE — Patient Care Conference (Signed)
Inpatient RehabilitationTeam Conference and Plan of Care Update Date: 05/14/2021   Time: 11:17 AM    Patient Name: [name]      Medical Record Number: [id number]  Date of Birth: 1956/08/12 Sex: Female         Room/Bed: 4W21C/4W21C-01 Payor Info: Payor: [employer] / Plan: [place] [employer] / Product Type: *No Product type* /    Admit Date/Time:  04/29/2021  2:23 PM  Primary Diagnosis:  Right thalamic infarction [facility])  Hospital Problems: Principal Problem:   Right thalamic infarction [facility]) Active Problems:   Leukocytosis   Hyponatremia   Essential hypertension   Slow transit constipation   Hemiparesis affecting left side as late effect of stroke ([place])   Uncontrolled type 2 diabetes mellitus with hyperglycemia ([place])   Labile blood glucose   Chronic bilateral low back pain without sciatica    Expected Discharge Date: Expected Discharge Date: 05/27/21  Team Members Present: Physician leading conference: Dr. Delice Lesch Care Coodinator Present: Dorien Chihuahua, RN, BSN, CRRN;[name] Dupree, LCSW Nurse Present: Dorien Chihuahua, RN PT Present: Page Spiro, PT OT Present: Meriel Pica, OT SLP Present: Charolett Bumpers, SLP PPS Coordinator present : Gunnar Fusi, SLP     Current Status/Progress Goal Weekly Team Focus  Bowel/Bladder             Swallow/Nutrition/ Hydration             ADL's   min A overall with self care; mod with transfers. very impaired L side proprioception  LB dressing and toileting upgraded to CGA;  min A transfers  ADL training, functional mobility and transfers, LUE NMR, sensory training, safety awareness   Mobility   CGA bed mobility, min assist sit<>stand, mod assist stand pivot transfers, gait up to 45ft using RW with mod assist - continues to demo L hemibody and hemienvironment inattention, impaired safety awareness, decreased awareness of LOB/poor midline awareness, and impaired activity tolerance  supervision/CGA overall with min assist for  gait  activity tolerance, L UE and LE NMR, bed mobility training, transfer training, standing balance, gait training, L hemibody/hemi-environment attention, pt education   Communication             Safety/Cognition/ Behavioral Observations  min-supervision  Supervision A  alternating attention, complex problem solving, medication management   Pain             Skin               Discharge Planning:  HOme with daughter who works during the day, but her boyfriend will be there, does not want him to do personal care though   Team Discussion: Patient with AKI; encourage po fluids. BP elevated, MD tweaking medications. Good progress noted overall limited by left side impairment; nonfunctional, poor safety, and poor awareness and no proprioception.  MD also monitoring meds for DM with late afternoon hypoglycemic episodes noted.  Patient on target to meet rehab goals: Currently min - mod assist transfers. Ambulates 22' with RW and mod assist. Discharge goals set for CGA - Min assist.  *See Care Plan and progress notes for long and short-term goals.   Revisions to Treatment Plan:   Working on mildly complex problem solving and medication management and safety  Teaching Needs: Transfers, toileting, medication management, secondary stroke risk management, dietary modifications, etc.   Current Barriers to Discharge: Decreased caregiver support and Home enviroment access/layout  Possible Resolutions to Barriers:  Family education    Medical Summary Current Status: Left  side hemiparesis to right thalamic infarction.  Status post mechanical thrombectomy of right PCA P2 occlusion 04/19/2021  Barriers to Discharge: Medical stability;Weight   Possible Resolutions to [employer] Focus: Therapies, follow labs - Na, Cr, optimize DM/BP meds   Continued Need for Acute Rehabilitation Level of Care: The patient requires daily medical management by a physician with specialized training in  physical medicine and rehabilitation for the following reasons: Direction of a multidisciplinary physical rehabilitation program to maximize functional independence : Yes Medical management of patient stability for increased activity during participation in an intensive rehabilitation regime.: Yes Analysis of laboratory values and/or radiology reports with any subsequent need for medication adjustment and/or medical intervention. : Yes   I attest that I was present, lead the team conference, and concur with the assessment and plan of the team.   Dorien Chihuahua B 05/14/2021, 3:36 PM

## 2021-05-14 NOTE — Progress Notes (Deleted)
[facility] PHYSICAL MEDICINE & REHABILITATION PROGRESS NOTE  Subjective/Complaints: Patient seen laying in bed this morning.  She states she slept well overnight.  She notes improvement in Pain and states she is feeling better overall.  ROS: Denies CP, SOB, N/V/D  Objective: Vital Signs: Blood pressure (!) 171/83, pulse 80, temperature (!) 97.5 F (36.4 C), temperature source Oral, resp. rate 18, SpO2 93 %. VAS [place] LOWER EXTREMITY VENOUS (DVT)  Result Date: 05/13/2021  Lower Venous DVT Study Patient Name:  [name]  Date of Exam:   05/13/2021 Medical Rec #: [id number]        Accession #:    [id number] Date of Birth: [birth year]       Patient Gender: F Patient Age:   64Y Exam Location:  [facility] Procedure:      VAS [place] LOWER EXTREMITY VENOUS (DVT) Referring Phys: [id number] Firman Petrow ANIL Kamy Poinsett --------------------------------------------------------------------------------  Indications: Pain.  Comparison Study: no prior Performing Technologist: Abram Sander RVS  Examination Guidelines: A complete evaluation includes B-mode imaging, spectral Doppler, color Doppler, and power Doppler as needed of all accessible portions of each vessel. Bilateral testing is considered an integral part of a complete examination. Limited examinations for reoccurring indications may be performed as noted. The reflux portion of the exam is performed with the patient in reverse Trendelenburg.  +-----+---------------+---------+-----------+----------+--------------+ RIGHTCompressibilityPhasicitySpontaneityPropertiesThrombus Aging +-----+---------------+---------+-----------+----------+--------------+ CFV  Full           Yes      Yes                                 +-----+---------------+---------+-----------+----------+--------------+   +---------+---------------+---------+-----------+----------+--------------+ LEFT     CompressibilityPhasicitySpontaneityPropertiesThrombus Aging  +---------+---------------+---------+-----------+----------+--------------+ CFV      Full           Yes      Yes                                 +---------+---------------+---------+-----------+----------+--------------+ SFJ      Full                                                        +---------+---------------+---------+-----------+----------+--------------+ FV Prox  Full                                                        +---------+---------------+---------+-----------+----------+--------------+ FV Mid   Full                                                        +---------+---------------+---------+-----------+----------+--------------+ FV DistalFull                                                        +---------+---------------+---------+-----------+----------+--------------+ PFV  Full                                                        +---------+---------------+---------+-----------+----------+--------------+ POP      Full           Yes      Yes                                 +---------+---------------+---------+-----------+----------+--------------+ PTV      Full                                                        +---------+---------------+---------+-----------+----------+--------------+ PERO     Full                                                        +---------+---------------+---------+-----------+----------+--------------+     Summary: RIGHT: - No evidence of common femoral vein obstruction.  LEFT: - There is no evidence of deep vein thrombosis in the lower extremity.  - No cystic structure found in the popliteal fossa.  *See table(s) above for measurements and observations. Electronically signed by Deitra Mayo MD on 05/13/2021 at 4:14:33 PM.    Final    Recent Labs    05/12/21 0939  WBC 7.5  HGB 12.4  HCT 37.0  PLT 284   Recent Labs    05/12/21 0939  NA 130*  K 4.2  CL 94*  CO2 28  GLUCOSE 153*   BUN 12  CREATININE 1.07*  CALCIUM 8.9    Intake/Output Summary (Last 24 hours) at 05/14/2021 1040 Last data filed at 05/14/2021 0700 Gross per 24 hour  Intake 680 ml  Output --  Net 680 ml        Physical Exam: BP (!) 171/83 (BP Location: Right Arm)   Pulse 80   Temp (!) 97.5 F (36.4 C) (Oral)   Resp 18   SpO2 93%   Constitutional: No distress . Vital signs reviewed. HENT: Normocephalic.  Atraumatic. Eyes: EOMI. No discharge. Cardiovascular: No JVD.  RRR. Respiratory: Normal effort.  No stridor.  Bilateral clear to auscultation. GI: Non-distended.  BS +. Skin: Warm and dry.  Intact. Psych: Normal mood.  Normal behavior. Musc: No edema in extremities.  No tenderness in extremities. Neuro: Alert Follows commands.  Left inattention, some improvement Fair insight and awareness.  Motor:LUE 4-4+/5 prox to distal with mild apraxia LLE: 4-/5 prox to distal, improving  Assessment/Plan: 1. Functional deficits which require 3+ hours per day of interdisciplinary therapy in a comprehensive inpatient rehab setting.  Physiatrist is providing close team supervision and 24 hour management of active medical problems listed below.  Physiatrist and rehab team continue to assess barriers to discharge/monitor patient progress toward functional and medical goals   Care Tool:  Bathing    Body parts bathed by patient: Chest,Abdomen,Right upper leg,Left upper leg,Face,Left arm,Front perineal area,Buttocks,Right arm,Right lower leg,Left lower leg   Body parts  bathed by helper: Right lower leg,Left lower leg     Bathing assist Assist Level: Contact Guard/Touching assist     Upper Body Dressing/Undressing Upper body dressing   What is the patient wearing?: Pull over shirt    Upper body assist Assist Level: Supervision/Verbal cueing    Lower Body Dressing/Undressing Lower body dressing      What is the patient wearing?: Underwear/pull up,Pants     Lower body assist Assist  for lower body dressing: Moderate Assistance - Patient 50 - 74%     Toileting Toileting    Toileting assist Assist for toileting: Moderate Assistance - Patient 50 - 74%     Transfers Chair/bed transfer  Transfers assist     Chair/bed transfer assist level: Moderate Assistance - Patient 50 - 74%     Locomotion Ambulation   Ambulation assist      Assist level: Moderate Assistance - Patient 50 - 74% Assistive device: Walker-rolling Max distance: 65ft   Walk 10 feet activity   Assist  Walk 10 feet activity did not occur: Safety/medical concerns  Assist level: Moderate Assistance - Patient - 50 - 74% Assistive device: Walker-rolling   Walk 50 feet activity   Assist Walk 50 feet with 2 turns activity did not occur: Safety/medical concerns  Assist level: Minimal Assistance - Patient > 75% Assistive device: Walker-rolling    Walk 150 feet activity   Assist Walk 150 feet activity did not occur: Safety/medical concerns         Walk 10 feet on uneven surface  activity   Assist Walk 10 feet on uneven surfaces activity did not occur: Safety/medical concerns         Wheelchair     Assist Will patient use wheelchair at discharge?: No   Wheelchair activity did not occur: N/A         Wheelchair 50 feet with 2 turns activity    Assist    Wheelchair 50 feet with 2 turns activity did not occur: N/A       Wheelchair 150 feet activity     Assist  Wheelchair 150 feet activity did not occur: N/A        Medical Problem List and Plan: 1.Left side hemiparesis to right thalamic infarction. Status post mechanical thrombectomy of right PCA P2 occlusion 04/19/2021. Status post loop recorder  Resume therapies  Team conference today to discuss current and goals and coordination of care, home and environmental barriers, and discharge planning with nursing, case manager, and therapies. Please see conference note from today as well.   WHO  nightly 2. Antithrombotics: -DVT/anticoagulation:SCDs  LLE doppler negative for DVT -antiplatelet therapy: Aspirin 81 mg daily, Plavix 75 mg dailyx3 weeks then aspirin alone 3. Pain Management:Tylenol as needed  Robaxin as needed started on 5/20, conitnue  Continue low dose gabapentin 100mg  qhs  Controlled on 6/1 4. Mood:Zoloft 100 mg daily, melatonin as needed. Provide emotional support -antipsychotic agents: N/A 5. Neuropsych: This patientiscapable of making decisions on herown behalf.  Discussed with Neuropsych - appreciate eval 6. Skin/Wound Care:Routine skin checks 7. Fluids/Electrolytes/Nutrition:Routine in and outs 8. Diabetes mellitus with hyperglycemia. Hemoglobin A1c 9.3.  Patient on Actos, Januvia 100 mg, Glucotrol XL5 mg daily,metformin 1000 mg twice daily prior to admission.   Currently onNovoLog 5units 3 times daily with meals, Levemir 12units twice daily.  Glucophage at 500mg  bid, increased to 1000 BID on 5/20  Diabetic teaching  Levemir to 11 units BID on 5/30  Slightly elevated on 6/1 9. Hypertension.  Cozaar 25 mg twice daily, increased to 50 on 5/24, changed to 75 BID on 5/27, decreased to 50 BID on 5/31  Norvasc 2.5 started on 6/1  Hydralizine 10 qhs started on 5/31, increased on 6/1 10. Hyperlipidemia. Lipitor 11. Obesity. BMI 33.19. Dietary follow-up 12. GERD. Protonix 13. Constipation: scheduled senna-s, encourage liquids, fruits, vegetables -dulcolax suppository/fleet enema prn  Adjust bowel meds as necessary 14. Cough: COVID negative -appears to be mostly pharyngeal, upper airway -Mucinex DM 15.  Hyponatremia  Sodium 130 on 5/30, will order labs within the week  Continue to monitor 16.  Leukocytosis: Resolved  Continue to monitor  Afebrile  Continue to monitor 17. Aortic valve mass: discussed finding with patient and plan for outpatient follow-up/removal.  18.  Cough: since current regimen not helping, cough is very bothersome to her, imaging is negative for infection, and dextromethorphan may be contributing to her fatigue, changed to Mucinex to 64mL TID guaifensin-codeine.  19. AKI  Cr 1.07 on 5/30, will order labs at the end of the week  Encourage fluids  LOS: 15 days A FACE TO [place] 05/14/2021, 10:40 AM

## 2021-05-14 NOTE — Progress Notes (Signed)
Speech Language Pathology Weekly Progress and Session Note  Patient Details  Name: HAMNA ASA MRN: [id number] Date of Birth: 03-19-1956  Beginning of progress report period: May 08, 2021 End of progress report period: May 14, 2021  Today's Date: 05/14/2021 SLP Individual Time: 1400-1445 SLP Individual Time Calculation (min): 45 min  Short Term Goals: Week 2: SLP Short Term Goal 1 (Week 2): Patient will complete simulated medication management task with supervision A. SLP Short Term Goal 1 - Progress (Week 2): Met SLP Short Term Goal 2 (Week 2): Patient will perform alternating attention tasks with minA cues to redirect. SLP Short Term Goal 2 - Progress (Week 2): Met SLP Short Term Goal 3 (Week 2): Patient will complete financial/money management task with minA cues for accuracy. SLP Short Term Goal 3 - Progress (Week 2): Met    New Short Term Goals: Week 3: SLP Short Term Goal 1 (Week 3): Pt will complete complex problem solving tasks with supervision A verbal cues. SLP Short Term Goal 2 (Week 3): Pt will self-monitoring and self-correct functional errors in complex tasks with supervision A verbal cues, SLP Short Term Goal 3 (Week 3): Pt will demonstrate anticipatory awareness skills in listing 3 activites that are safe verus unsafe due to acute deficits with supervision A verbal cues. SLP Short Term Goal 4 (Week 3): Pt will demonstrate left attention (of left arm) during functional transfers (ex: turning) with min A verbal cues. SLP Short Term Goal 5 (Week 3): Pt will demonstrate alternating attention in functionals tasks with supervision A verbal cues.  Weekly Progress Updates: Pt made good progress meeting 4 out 4 goals this reporting period. Pt requires min-supervision A verbal cues for alternating attention, complex problem solving, and error awareness. Pt demonstrates left attention in table top task, however increased impairment is noted during mobility. Pt continues to  demonstrate overall awareness of impact of deficits, emergent and anticipatory awareness. Pt would continue to benefit from skilled ST services in order to maximize functional independence and reduce burden of care, requiring 24 hour supervision at discharge with continued skilled ST services.      Intensity: Minumum of 1-2 x/day, 30 to 90 minutes Frequency: 3 to 5 out of 7 days Duration/Length of Stay: 6/14 Treatment/Interventions: Cognitive remediation/compensation;Cueing hierarchy;Functional tasks;Medication managment;Internal/external aids;Environmental controls;Patient/family education   Daily Session  Skilled Therapeutic Interventions: Skilled ST services focused on cognitive skills. SLP facilitated mildly complex problem solving and error awareness skills in BID pill organizer, pt demonstrated x2 errors and only able to correct errors with direct cuing. Pt demonstrated mildly complex and complex problem solving skills In account balancing tasks with supervision A verbal cues during the complex task due to recall deficits in task. Pt was left in room with call bell within reach and bed alarm set. SLP recommends to continue skilled services.    General    Pain Pain Assessment Pain Score: 0-No pain  Therapy/Group: Individual Therapy  [place] Manera  [facility] 05/14/2021, 3:48 PM

## 2021-05-15 LAB — GLUCOSE, CAPILLARY
Glucose-Capillary: 140 mg/dL — ABNORMAL HIGH (ref 70–99)
Glucose-Capillary: 131 mg/dL — ABNORMAL HIGH (ref 70–99)
Glucose-Capillary: 122 mg/dL — ABNORMAL HIGH (ref 70–99)
Glucose-Capillary: 141 mg/dL — ABNORMAL HIGH (ref 70–99)

## 2021-05-15 MED ORDER — AMLODIPINE BESYLATE 2.5 MG PO TABS
2.5000 mg | ORAL_TABLET | Freq: Every day | ORAL | Status: DC
  Administered 2021-05-16 – 2021-05-20 (×5): 2.5 mg via ORAL
  Filled 2021-05-15 (×5): qty 1

## 2021-05-15 MED ORDER — INSULIN DETEMIR 100 UNIT/ML ~~LOC~~ SOLN
12.0000 [IU] | Freq: Two times a day (BID) | SUBCUTANEOUS | Status: DC
  Administered 2021-05-15 – 2021-05-24 (×18): 12 [IU] via SUBCUTANEOUS
  Filled 2021-05-15 (×20): qty 0.12

## 2021-05-15 NOTE — Progress Notes (Signed)
[name] PHYSICAL MEDICINE & REHABILITATION PROGRESS NOTE  Subjective/Complaints: Patient seen laying in bed this AM.  She states she slept well overnight.  He is happy that her discharge date was extended.   ROS: Denies CP, SOB, N/V/D  Objective: Vital Signs: Blood pressure (!) 159/69, pulse 68, temperature 98.3 F (36.8 C), resp. rate 19, SpO2 95 %. VAS [place] LOWER EXTREMITY VENOUS (DVT)  Result Date: 05/13/2021  Lower Venous DVT Study Patient Name:  [name]  Date of Exam:   05/13/2021 Medical Rec #: [id number]        Accession #:    [id number] Date of Birth: 14-Dec-1956       Patient Gender: F Patient Age:   64Y Exam Location:  [facility] Procedure:      VAS [place] LOWER EXTREMITY VENOUS (DVT) Referring Phys: [id number] Rueben Kassim ANIL Deb Loudin --------------------------------------------------------------------------------  Indications: Pain.  Comparison Study: no prior Performing Technologist: Abram Sander RVS  Examination Guidelines: A complete evaluation includes B-mode imaging, spectral Doppler, color Doppler, and power Doppler as needed of all accessible portions of each vessel. Bilateral testing is considered an integral part of a complete examination. Limited examinations for reoccurring indications may be performed as noted. The reflux portion of the exam is performed with the patient in reverse Trendelenburg.  +-----+---------------+---------+-----------+----------+--------------+ RIGHTCompressibilityPhasicitySpontaneityPropertiesThrombus Aging +-----+---------------+---------+-----------+----------+--------------+ CFV  Full           Yes      Yes                                 +-----+---------------+---------+-----------+----------+--------------+   +---------+---------------+---------+-----------+----------+--------------+ LEFT     CompressibilityPhasicitySpontaneityPropertiesThrombus Aging +---------+---------------+---------+-----------+----------+--------------+  CFV      Full           Yes      Yes                                 +---------+---------------+---------+-----------+----------+--------------+ SFJ      Full                                                        +---------+---------------+---------+-----------+----------+--------------+ FV Prox  Full                                                        +---------+---------------+---------+-----------+----------+--------------+ FV Mid   Full                                                        +---------+---------------+---------+-----------+----------+--------------+ FV DistalFull                                                        +---------+---------------+---------+-----------+----------+--------------+ PFV      Full                                                        +---------+---------------+---------+-----------+----------+--------------+  POP      Full           Yes      Yes                                 +---------+---------------+---------+-----------+----------+--------------+ PTV      Full                                                        +---------+---------------+---------+-----------+----------+--------------+ PERO     Full                                                        +---------+---------------+---------+-----------+----------+--------------+     Summary: RIGHT: - No evidence of common femoral vein obstruction.  LEFT: - There is no evidence of deep vein thrombosis in the lower extremity.  - No cystic structure found in the popliteal fossa.  *See table(s) above for measurements and observations. Electronically signed by Deitra Mayo MD on 05/13/2021 at 4:14:33 PM.    Final    No results for input(s): WBC, HGB, HCT, PLT in the last 72 hours. No results for input(s): NA, K, CL, CO2, GLUCOSE, BUN, CREATININE, CALCIUM in the last 72 hours.  Intake/Output Summary (Last 24 hours) at 05/15/2021 1013 Last data  filed at 05/15/2021 0701 Gross per 24 hour  Intake 900 ml  Output --  Net 900 ml        Physical Exam: BP (!) 159/69 (BP Location: Left Arm)   Pulse 68   Temp 98.3 F (36.8 C)   Resp 19   SpO2 95%   Constitutional: No distress . Vital signs reviewed. HENT: Normocephalic.  Atraumatic. Eyes: EOMI. No discharge. Cardiovascular: No JVD.  RRR. Respiratory: Normal effort.  No stridor.  Bilateral clear to auscultation. GI: Non-distended.  BS +. Skin: Warm and dry.  Intact. Psych: Normal mood.  Normal behavior. Musc: No edema in extremities.  No tenderness in extremities. Neuro: Alert Follows commands.  Left inattention, some improvement Fair insight and awareness.  Motor:LUE 4-4+/5 prox to distal with mild apraxia, improving LLE: 4--4/5 prox to distal   Assessment/Plan: 1. Functional deficits which require 3+ hours per day of interdisciplinary therapy in a comprehensive inpatient rehab setting.  Physiatrist is providing close team supervision and 24 hour management of active medical problems listed below.  Physiatrist and rehab team continue to assess barriers to discharge/monitor patient progress toward functional and medical goals   Care Tool:  Bathing    Body parts bathed by patient: Chest,Abdomen,Right upper leg,Left upper leg,Face,Left arm,Front perineal area,Buttocks,Right arm,Right lower leg,Left lower leg   Body parts bathed by helper: Right lower leg,Left lower leg     Bathing assist Assist Level: Contact Guard/Touching assist     Upper Body Dressing/Undressing Upper body dressing   What is the patient wearing?: Pull over shirt    Upper body assist Assist Level: Supervision/Verbal cueing    Lower Body Dressing/Undressing Lower body dressing      What is the patient wearing?: Underwear/pull up,Pants     Lower body assist Assist for lower body dressing:  Moderate Assistance - Patient 50 - 74%     Toileting Toileting    Toileting assist Assist for  toileting: Moderate Assistance - Patient 50 - 74%     Transfers Chair/bed transfer  Transfers assist     Chair/bed transfer assist level: Minimal Assistance - Patient > 75%     Locomotion Ambulation   Ambulation assist      Assist level: Minimal Assistance - Patient > 75% Assistive device: No Device Max distance: 34ft   Walk 10 feet activity   Assist  Walk 10 feet activity did not occur: Safety/medical concerns  Assist level: Moderate Assistance - Patient - 50 - 74% Assistive device: No Device   Walk 50 feet activity   Assist Walk 50 feet with 2 turns activity did not occur: Safety/medical concerns  Assist level: Moderate Assistance - Patient - 50 - 74% Assistive device: No Device    Walk 150 feet activity   Assist Walk 150 feet activity did not occur: Safety/medical concerns         Walk 10 feet on uneven surface  activity   Assist Walk 10 feet on uneven surfaces activity did not occur: Safety/medical concerns         Wheelchair     Assist Will patient use wheelchair at discharge?: No   Wheelchair activity did not occur: N/A         Wheelchair 50 feet with 2 turns activity    Assist    Wheelchair 50 feet with 2 turns activity did not occur: N/A       Wheelchair 150 feet activity     Assist  Wheelchair 150 feet activity did not occur: N/A        Medical Problem List and Plan: 1.Left side hemiparesis to right thalamic infarction. Status post mechanical thrombectomy of right PCA P2 occlusion 04/19/2021. Status post loop recorder  Continue CIR  WHO nightly 2. Antithrombotics: -DVT/anticoagulation:SCDs  LLE doppler negative for DVT -antiplatelet therapy: Aspirin 81 mg daily, Plavix 75 mg dailyx3 weeks then aspirin alone 3. Pain Management:Tylenol as needed  Robaxin as needed started on 5/20, conitnue  Continue low dose gabapentin 100mg  qhs  Controlled on 6/2 4. Mood:Zoloft 100 mg daily,  melatonin as needed. Provide emotional support -antipsychotic agents: N/A 5. Neuropsych: This patientiscapable of making decisions on herown behalf.  Discussed with Neuropsych - appreciate eval 6. Skin/Wound Care:Routine skin checks 7. Fluids/Electrolytes/Nutrition:Routine in and outs 8. Diabetes mellitus with hyperglycemia. Hemoglobin A1c 9.3.  Patient on Actos, Januvia 100 mg, Glucotrol XL5 mg daily,metformin 1000 mg twice daily prior to admission.   Currently onNovoLog 3units 3 times daily with meals  Glucophage at 500mg  bid, increased to 1000 BID on 5/20  Diabetic teaching  Levemir to 11 units BID on 5/30, increased to 12 BID on 6/2 9. Hypertension. Cozaar 25 mg twice daily, increased to 50 on 5/24, changed to 75 BID on 5/27, decreased to 50 BID on 5/31  Norvasc 2.5 started on 6/1, changed to qhs on 6/3  Hydralizine 10 qhs started on 5/31, increased on 6/1 10. Hyperlipidemia. Lipitor 11. Obesity. BMI 33.19. Dietary follow-up 12. GERD. Protonix 13. Constipation: scheduled senna-s, encourage liquids, fruits, vegetables -dulcolax suppository/fleet enema prn  Adjust bowel meds as necessary 14. Cough: COVID negative -appears to be mostly pharyngeal, upper airway -Mucinex DM 15.  Hyponatremia  Sodium 130 on 5/30, labs ordered for tomorrow  Continue to monitor 16.  Leukocytosis: Resolved  Continue to monitor  Afebrile  Continue to monitor 17.  Aortic valve mass: discussed finding with patient and plan for outpatient follow-up/removal.  18. Cough: since current regimen not helping, cough is very bothersome to her, imaging is negative for infection, and dextromethorphan may be contributing to her fatigue, changed to Mucinex to 34mL TID guaifensin-codeine.  19. AKI  Cr 1.07 on 5/30, labs ordered for tomorrow  Encourage fluids  LOS: 16 days A FACE TO FACE EVALUATION WAS PERFORMED  Natalyah Cummiskey Lorie Phenix 05/15/2021, 10:13  AM

## 2021-05-15 NOTE — Progress Notes (Signed)
Physical Therapy Session Note  Patient Details  Name: [name] MRN: [id number] Date of Birth: Aug 03, 1956  Today's Date: 05/15/2021 PT Individual Time: 8270-7867 PT Individual Time Calculation (min): 30 min   Short Term Goals: Week 1:  PT Short Term Goal 1 (Week 1): Pt will complete bed mobility with minA PT Short Term Goal 1 - Progress (Week 1): Met PT Short Term Goal 2 (Week 1): Pt will complete bed<>chair transfers with minA PT Short Term Goal 2 - Progress (Week 1): Met PT Short Term Goal 3 (Week 1): Pt will ambulate 54f with modA and LRAD PT Short Term Goal 3 - Progress (Week 1): Not met Week 2:  PT Short Term Goal 1 (Week 2): Pt will complete bed mobility with CGA PT Short Term Goal 1 - Progress (Week 2): Met PT Short Term Goal 2 (Week 2): Pt will complete bed<>chair transfers with CGA and LRAD PT Short Term Goal 2 - Progress (Week 2): Not met (More consistently requires minA) PT Short Term Goal 3 (Week 2): Pt will ambulate 289fwith modA and LRAD PT Short Term Goal 3 - Progress (Week 2): Met Week 3:  PT Short Term Goal 1 (Week 3): Pt will complete bed<>chair transfers consistently with CGA and LRAD PT Short Term Goal 2 (Week 3): Pt will ambulate 10044fith minA and LRAD PT Short Term Goal 3 (Week 3): Pt will demonstrate improved L attention into functional mobility tasks  Skilled Therapeutic Interventions/Progress Updates:    pt received in bed and agreeable to therapy. Pt directed in supine>sit CGA and Stand pivot transfer min A to WC. Pt then taken to gym total A for time, directed in gait training with Rolling walker min A-CGA for 4x25' tactile VC for L knee extension. Pt CGA for transfers throughout. Pt returned to room, requested to return to supine, CGA to complete. Pt left in bed, All needs in reach and in good condition. Call light in hand.  And alarm set.   Therapy Documentation Precautions:  Precautions Precautions: Fall Precaution Comments: poor L side sensory  awareness Restrictions Weight Bearing Restrictions: No General:   Vital Signs: Therapy Vitals Temp: 97.8 F (36.6 C) Temp Source: Oral Pulse Rate: 66 Resp: 20 BP: (!) 175/83 Patient Position (if appropriate): Lying Oxygen Therapy SpO2: 97 % O2 Device: Room Air Pain: Pain Assessment Pain Scale: 0-10 Pain Score: 4  Faces Pain Scale: Hurts a little bit Pain Type: Acute pain Pain Location: Arm Pain Orientation: Left Pain Descriptors / Indicators: Aching;Sore Pain Onset: With Activity Pain Intervention(s): Repositioned Mobility:   Locomotion :    Trunk/Postural Assessment :    Balance:   Exercises:   Other Treatments:      Therapy/Group: Individual Therapy  HalJunie Panning2/2022, 4:13 PM

## 2021-05-15 NOTE — Progress Notes (Signed)
Speech Language Pathology Daily Session Note  Patient Details  Name: [name] MRN: [id number] Date of Birth: 1956-06-19  Today's Date: 05/15/2021 SLP Individual Time: 1030-1100 SLP Individual Time Calculation (min): 30 min  Short Term Goals: Week 3: SLP Short Term Goal 1 (Week 3): Pt will complete complex problem solving tasks with supervision A verbal cues. SLP Short Term Goal 2 (Week 3): Pt will self-monitoring and self-correct functional errors in complex tasks with supervision A verbal cues, SLP Short Term Goal 3 (Week 3): Pt will demonstrate anticipatory awareness skills in listing 3 activites that are safe verus unsafe due to acute deficits with supervision A verbal cues. SLP Short Term Goal 4 (Week 3): Pt will demonstrate left attention (of left arm) during functional transfers (ex: turning) with min A verbal cues. SLP Short Term Goal 5 (Week 3): Pt will demonstrate alternating attention in functionals tasks with supervision A verbal cues.  Skilled Therapeutic Interventions:Skilled SLP intervention focused on cognition. Pt completed calendar organization task with supervision A verbal cues once familiar with task. She demonstrated adequate attention between 2 pages to trasnfer and copy information onto calendar. She included all relevant information for each schedule event and was able to refer to information on second portion of task to determine her availability. Pt left seated at EOB with OT for next session. Cont with therapy per plan of care.       Pain Pain Assessment Pain Scale: Faces Pain Score: 2  Faces Pain Scale: No hurt Pain Type: Acute pain Pain Location: Arm Pain Orientation: Left Pain Radiating Towards: leg Pain Descriptors / Indicators: Aching Pain Frequency: Intermittent Pain Onset: On-going Pain Intervention(s): Medication (See eMAR)  Therapy/Group: Individual Therapy  Gregary Signs A Erie Sica 05/15/2021, 11:04 AM

## 2021-05-15 NOTE — Progress Notes (Signed)
Physical Therapy Weekly Progress Note  Patient Details  Name: [name] MRN: [id number] Date of Birth: 04-Oct-1956  Beginning of progress report period: May 07, 2021 End of progress report period: May 15, 2021  Today's Date: 05/15/2021 PT Individual Time: 0900-1010 PT Individual Time Calculation (min): 70 min   Patient has met 2 of 3 short term goals. Pt continues to make strong progress towards LTG. She is able to complete bed mobility with supervision (due to L inattention), bed<>chair transfers with minA via squat pivot, is able to ambulate up to 39f with RW and min/modA for balance and AD management. Functional mobility has also been impacted by cognitive deficits such as poor sustained attention, decreased safety awareness, L inattention, and decreased problem solving.   Patient continues to demonstrate the following deficits muscle weakness, decreased cardiorespiratoy endurance, unbalanced muscle activation and decreased coordination, decreased attention to left, decreased motor planning and ideational apraxia, decreased attention, decreased awareness, decreased problem solving and decreased safety awareness and decreased sitting balance, decreased standing balance, decreased postural control, hemiplegia and decreased balance strategies and therefore will continue to benefit from skilled PT intervention to increase functional independence with mobility.  Patient progressing toward long term goals..Continue plan of care.  PT Short Term Goals Week 2:  PT Short Term Goal 1 (Week 2): Pt will complete bed mobility with CGA PT Short Term Goal 1 - Progress (Week 2): Met PT Short Term Goal 2 (Week 2): Pt will complete bed<>chair transfers with CGA and LRAD PT Short Term Goal 2 - Progress (Week 2): Not met (More consistently requires minA) PT Short Term Goal 3 (Week 2): Pt will ambulate 210fwith modA and LRAD PT Short Term Goal 3 - Progress (Week 2): Met Week 3:  PT Short Term Goal 1  (Week 3): Pt will complete bed<>chair transfers consistently with CGA and LRAD PT Short Term Goal 2 (Week 3): Pt will ambulate 10090fith minA and LRAD PT Short Term Goal 3 (Week 3): Pt will demonstrate improved L attention into functional mobility tasks  Skilled Therapeutic Interventions/Progress Updates:    Pt greeted supine in bed with friend, LisLattie Hawt bedside. Pt agreeable to PT tx. Reports mild LLE pain at hip flexor region - rest breaks and mobility provided for pain management however pt without c/o the rest of session. Pt reporting urgent need to use the bathroom. Bed mobility completed with supervision with cues needed for L arm awareness. Completed stand<>pivot transfer towards paretic L side with modA for balance. Wheeled in w/c to bathroom due to urgency and completed stand<>pivot with modA and use of grab bars in bathroom with similar fashion. Pt continent of B & B - required totalA for posterior pericare while needing modA for standing balance. Wheeled sinkside to perform hand hygiene. Pt with questions regarding visitor policy and also believed that today was a family education/training day - nothing documented in notes regarding this however pt's son took off work - pt needing to call him to update and he will arrive later this afternoon for therapies. Wheeled to day room gym for time management and focused remainder of session on functional gait training and standing balance. Ambulated 4x25f29fth minA and RW - assist for RW management and intermittent L knee block during stance to prevent buckling - cues for increasing L heel strike and L step length. She then performed standing task at high/low table with stacking cones L<>R with minA for standing balance while working on LUE grasp/release and [facility]  for cones. Returned to room in w/c and completed squat<>pivot with minA towards stronger R side to bed with use of bed rail. Needing CGA for sit>supine with use of bed features. LUE supported with  pillow, k-pad to LLE, and all needs within reach. Bed alarm set.   Therapy Documentation Precautions:  Precautions Precautions: Fall Precaution Comments: poor L side sensory awareness Restrictions Weight Bearing Restrictions: No General:    Therapy/Group: Individual Therapy  Cathrine Krizan P Yola Paradiso PT 05/15/2021, 7:38 AM

## 2021-05-15 NOTE — Progress Notes (Signed)
Occupational Therapy Session Note  Patient Details  Name: [name] MRN: [id number] Date of Birth: 1956/05/06  Today's Date: 05/15/2021 OT Individual Time: 1110-1205 OT Individual Time Calculation (min): 55 min    Short Term Goals: Week 1:  OT Short Term Goal 1 (Week 1): pt will complete squat pivot transfers with mod A. OT Short Term Goal 1 - Progress (Week 1): Met OT Short Term Goal 2 (Week 1): Pt will be able to hold static stand with min A. OT Short Term Goal 2 - Progress (Week 1): Met OT Short Term Goal 3 (Week 1): Pt will don shirt with min A OT Short Term Goal 3 - Progress (Week 1): Met OT Short Term Goal 4 (Week 1): Pt will be able to don pants over feet with min A. OT Short Term Goal 4 - Progress (Week 1): Met OT Short Term Goal 5 (Week 1): Pt will be able to pull pants over hips with mod A OT Short Term Goal 5 - Progress (Week 1): Met Week 2:  OT Short Term Goal 1 (Week 2): STGs = LTGs  Skilled Therapeutic Interventions/Progress Updates:    Pt supine in bed, requesting to bathe so she can wash her hair.  Reports she has been moving her left wrist "back and forth a lot" and feels sore.  Educated pt on reducing frequency of wrist exercises to avoid overuse.  Pt completed supine to sit with HOB elevated and using grab bar with close supervision.  Stand pivot EOB to w/c with min assist using RW with mod VCs and TCs to ensure left hand maintained on walker splint throughout transfer.  Pt transported to bathroom via w/c and completed stand pivot w/c to shower bench using RW with min assist as well.  Pt doffed shirt, underwear, pants, and socks with CGA at sit<>stand level.  Pt bathed UB with close supervision and demonstrating increased use of LUE without needing cues for functional use.  Pt bathed LB using long handled sponge with CGA needing frequent VCs to attend to LUE and to slow down for improved control and safety due to pt impulsively/quickly leaning forward/to the side and  losing her balance.  Pt completed sit<>Stand at stedy with CGA and transported to EOB. Donned shirt with increased time and supervision.  Donned underwear and pants with min assist using reacher.  Donned left heelbow sleeve to protect LUE skin integrity.  Supervision sit to supine.  Call bell in reach, bed alarm on.  Therapy Documentation Precautions:  Precautions Precautions: Fall Precaution Comments: poor L side sensory awareness Restrictions Weight Bearing Restrictions: No   Therapy/Group: Individual Therapy  Ezekiel Slocumb 05/15/2021, 3:04 PM

## 2021-05-15 NOTE — Progress Notes (Signed)
Physical Therapy Session Note  Patient Details  Name: [name] MRN: [id number] Date of Birth: 1956-04-24  Today's Date: 05/15/2021 PT Individual Time: 1534-1600 PT Individual Time Calculation (min): 26 min   Short Term Goals: Week 3:  PT Short Term Goal 1 (Week 3): Pt will complete bed<>chair transfers consistently with CGA and LRAD PT Short Term Goal 2 (Week 3): Pt will ambulate 172ft with minA and LRAD PT Short Term Goal 3 (Week 3): Pt will demonstrate improved L attention into functional mobility tasks  Skilled Therapeutic Interventions/Progress Updates:    Patient in supine and agreeable to work with PT for missed time.  Patient supine to sit with S.  Patient sit to stand with CGA to RW with L hand splint.  Stand step to w/c with min A.  Patient in w/c assisted to therapy gym.  Patient ambulated 2 x 42' with RW and min to mod A for L hip extension and hemipelvic protraction, for walker safety and progression and cues for forward gaze and L hand management.  Patient standing at hi-lo table to play checkers with R foot on 4" step for L forced use with min to mod A and cues for L knee and hip extension throughout.  As leg fatigued encouraged to continue standing with both feet on floor.  As pt fatigued ambulated to w/c x 12' with min to mod A, lowering into chair with uncontrolled descent.  PAtient assisted to room in w/c and stand step to bed with RW and min A.  Sit to supine with S and positioned with pillow and heat pad under L LE and call bell and needs in reach with bed alarm active.   Therapy Documentation Precautions:  Precautions Precautions: Fall Precaution Comments: poor L side sensory awareness Restrictions Weight Bearing Restrictions: No Pain: Pain Assessment Pain Scale: 0-10 Pain Score: 4  Faces Pain Scale: Hurts a little bit Pain Type: Acute pain Pain Location: Arm Pain Orientation: Left Pain Descriptors / Indicators: Aching;Sore Pain Onset: With Activity Pain  Intervention(s): Repositioned   Therapy/Group: Individual Therapy  Reginia Naas  Magda Kiel, PT 05/15/2021, 4:10 PM

## 2021-05-16 DIAGNOSIS — E875 Hyperkalemia: Secondary | ICD-10-CM

## 2021-05-16 LAB — BASIC METABOLIC PANEL
Anion gap: 8 (ref 5–15)
BUN: 8 mg/dL (ref 8–23)
CO2: 26 mmol/L (ref 22–32)
Calcium: 8.9 mg/dL (ref 8.9–10.3)
Chloride: 97 mmol/L — ABNORMAL LOW (ref 98–111)
Creatinine, Ser: 0.8 mg/dL (ref 0.44–1.00)
GFR, Estimated: >60 mL/min (ref >60)
Glucose, Bld: 116 mg/dL — ABNORMAL HIGH (ref 70–99)
Potassium: 5.6 mmol/L — ABNORMAL HIGH (ref 3.5–5.1)
Sodium: 131 mmol/L — ABNORMAL LOW (ref 135–145)

## 2021-05-16 LAB — GLUCOSE, CAPILLARY
Glucose-Capillary: 140 mg/dL — ABNORMAL HIGH (ref 70–99)
Glucose-Capillary: 134 mg/dL — ABNORMAL HIGH (ref 70–99)
Glucose-Capillary: 61 mg/dL — ABNORMAL LOW (ref 70–99)
Glucose-Capillary: 109 mg/dL — ABNORMAL HIGH (ref 70–99)
Glucose-Capillary: 156 mg/dL — ABNORMAL HIGH (ref 70–99)

## 2021-05-16 MED ORDER — SODIUM ZIRCONIUM CYCLOSILICATE 5 G PO PACK
5.0000 g | PACK | Freq: Once | ORAL | Status: AC
  Administered 2021-05-16: 5 g via ORAL
  Filled 2021-05-16: qty 1

## 2021-05-16 NOTE — Progress Notes (Signed)
Physical Therapy Session Note  Patient Details  Name: [name] MRN: [id number] Date of Birth: 12/08/1956  Today's Date: 05/16/2021 PT Individual Time: 0800-0856 PT Individual Time Calculation (min): 56 min   Short Term Goals: Week 3:  PT Short Term Goal 1 (Week 3): Pt will complete bed<>chair transfers consistently with CGA and LRAD PT Short Term Goal 2 (Week 3): Pt will ambulate 191ft with minA and LRAD PT Short Term Goal 3 (Week 3): Pt will demonstrate improved L attention into functional mobility tasks  Skilled Therapeutic Interventions/Progress Updates:    Pt greeted supine in bed sleeping, awakens easily to voice. Pt agreeable to PT tx but reports she didn't receive a daily therapy schedule - this was retrieved and written down for her to allow improved participation for remainder sessions today - pt also appreciative. Supine<>sit with supervision with HOB flat but use of bed rail - cues needed for L hemibody awareness. Once sitting EOB, pt reports need to use bathroom. Stand<>pivot with minA towards paretic L side to w/c and wheeled into bathroom. Completed additional stand<>pivot with similar fashion to [facility] over toilet. Pt continent of bladder and able to perform frontal pericare without assist. Returned to w/c via stand-pivot with minA but needed modA for standing balance while she pulled pants/underwear over hips. Wheeled sinkside where she performed hand hygiene and oral care with setupA. Wheeled to main rehab gym for time management. Gait training 157ft (!!) with minA and RW on level ground - improved ability to weight shift to LLE and facilitate quad activation to reduce knee buckling. Focused remainder of session on stair training as she has 2 STE home. Educated on proper stepping sequencing (up with R foot, down with L foot) and provided demonstration prior to improve carryover. Pt navigated up/down the smaller (3 inch) 8 steps (!!) with modA and 1 hand rail on the R side. Pt  able to recall stepping strategies but had intermittent L knee buckling during ascent and needed cues for L foot placement to ensure full foot was on step. Pt very pleased with performance this morning due to achieving new milestones. Wheeled back to room with totalA for energy conservation and assisted back to bed via stand pivot with minA. Bed mobility completed with supervision and pt made comfortable. All needs within reach and bed alarm on. NT present for linen change and MD present for morning rounds.   Therapy Documentation Precautions:  Precautions Precautions: Fall Precaution Comments: poor L side sensory awareness Restrictions Weight Bearing Restrictions: No General:    Therapy/Group: Individual Therapy  Dhyan Noah P Paola Flynt PT 05/16/2021, 7:33 AM

## 2021-05-16 NOTE — Progress Notes (Signed)
Occupational Therapy Session Note  Patient Details  Name: [name] MRN: [id number] Date of Birth: 06/12/1956  Today's Date: 05/16/2021 OT Group Time: 1100-1145 OT Group Time Calculation (min): 45 min    Short Term Goals: Week 2:  OT Short Term Goal 1 (Week 2): STGs = LTGs  Skilled Therapeutic Interventions/Progress Updates:    Pt participated in rhythmic drumming group. Pain not reported during session. Focus of group on BUE coordination, strengthening, endurance, timing/control, activity tolerance, and social participation and engagement. Pt performs session from seated position for energy conservation. Skilled interventions included improved grasp with decreased grip slips with coban wrapping, Improved timing.control of becep this date compared to last week.. Warm up performed prior to exercises and UB stretching completed at end of group with demo from OT. Pt able to select preferred song to share with group. Returned pt to room at end of session. Exited session with pt seated in w/c, exit alarm on and call light in reach   Therapy Documentation Precautions:  Precautions Precautions: Fall Precaution Comments: poor L side sensory awareness Restrictions Weight Bearing Restrictions: No General:   Vital Signs: Therapy Vitals Temp: 98 F (36.7 C) Temp Source: Oral Pulse Rate: 71 Resp: 14 BP: (!) 148/100 Patient Position (if appropriate): Lying Oxygen Therapy SpO2: 95 % Pain:   ADL: ADL Eating: Set up Grooming: Setup Upper Body Bathing: Supervision/safety Where Assessed-Upper Body Bathing: Shower Lower Body Bathing: Minimal assistance Where Assessed-Lower Body Bathing: Shower Upper Body Dressing: Contact guard Where Assessed-Upper Body Dressing: Sitting at sink Lower Body Dressing: Moderate assistance Where Assessed-Lower Body Dressing: Sitting at sink,Standing at sink Toileting: Moderate assistance Toilet Transfer: Minimal assistance Toilet Transfer Method:  Squat pivot Toilet Transfer Equipment: Energy manager: Environmental education officer Method: Education officer, environmental: Engineer, maintenance (IT)    Praxis   Exercises:   Other Treatments:     Therapy/Group: Group Therapy  [name] 05/16/2021, 6:50 AM

## 2021-05-16 NOTE — Progress Notes (Signed)
Speech Language Pathology Daily Session Note  Patient Details  Name: NATSUMI WHITSITT MRN: [id number] Date of Birth: 03-30-1956  Today's Date: 05/16/2021 SLP Individual Time: 1500-1510 SLP Individual Time Calculation (min): 10 min  Short Term Goals: Week 3: SLP Short Term Goal 1 (Week 3): Pt will complete complex problem solving tasks with supervision A verbal cues. SLP Short Term Goal 2 (Week 3): Pt will self-monitoring and self-correct functional errors in complex tasks with supervision A verbal cues, SLP Short Term Goal 3 (Week 3): Pt will demonstrate anticipatory awareness skills in listing 3 activites that are safe verus unsafe due to acute deficits with supervision A verbal cues. SLP Short Term Goal 4 (Week 3): Pt will demonstrate left attention (of left arm) during functional transfers (ex: turning) with min A verbal cues. SLP Short Term Goal 5 (Week 3): Pt will demonstrate alternating attention in functionals tasks with supervision A verbal cues.  Skilled Therapeutic Interventions:   Patient seen for ST session however 20 minutes missed due to patient needing toileting assistance from nursing. SLP provided verbal cues and assistance with transfer from bed via [place]. Patient able to verbalize steps required for transfer and performed all without unsafe behaviors observed. Patient did not appear aware that transfer method had been recently updated from [place] to [facility] stand pivot transfer. Patient continues to benefit from skilled SLP intervention to maximize cognitive function prior to discharge.  Pain Pain Assessment Pain Scale: Faces Faces Pain Scale: Hurts a little bit Pain Type: Acute pain Pain Location: Head Pain Descriptors / Indicators: Aching Pain Intervention(s): RN made aware  Therapy/Group: Individual Therapy  Sonia Baller, MA, CCC-SLP Speech Therapy

## 2021-05-16 NOTE — Progress Notes (Addendum)
Occupational Therapy Session Note  Patient Details  Name: [name] MRN: [id number] Date of Birth: 1956/05/06  Today's Date: 05/16/2021 OT Individual Time: 1305-1403 OT Individual Time Calculation (min): 58 min    Short Term Goals: Week 2:  OT Short Term Goal 1 (Week 2): STGs = LTGs  Skilled Therapeutic Interventions/Progress Updates:    Pt supine in bed, reporting left hand is sore when she grips.  Re-educated pt on benefits of resting hand due to pt report she was "working it all last night".  Pt requesting to shower and change clothes with OT.  Supine to sit with supervision.  Sit to stand at [facility] with CGA and ambulated to bathroom with min assist for RW mgt.  Pt completed stand to sit at shower bench with CGA and VCs for safe hand placement.  Pt doffed shirt, underwear, pants, and socks with supervision.  Pt bathed UB and LB with CGA using long handled sponge.Pt needing frequent VCs to protect LUE and ensure left sided attention maintained due to pt initiating grasp on grab bar however looking away prior to fully grasping and then attempting to lean on for support and slightly losing balance.  Pt donned shirt in seated position with supervision.  Sit<>stand at [facility] with CGA needing VCs to safely grasp walker splint and apply strap prior to standing.    Pt ambulated bathroom to EOB needing min assist and max multimodal cues to avoid sink on left side and prevent premature descent and to bring RW fully in front of pt prior to sitting.  Pt donned underwear and pants using reacher needing mod assist to thread over BLE and pull over left hip.  Stand pivot using RW to w/c with CGA and improved carryover of safety awareness, left attention, and RW mgt. Pt easily distracted today, discussing various topics needing intermittent VCs to stay on task and for improved left sided attention and motor control.   Transported pt to ortho gym to participate in seated LUE neuro re-ed facing EOM. Pt utilized 2#  wrist weight on LUE and pushed wash cloth on mat to tap various target in multiple directions for increased proprioceptive input and facilitate left sided attention.  Pt transported back to room via w/c.  Max assist needed to donn left heelbow sleeve to protect LUE skin integrity.  Sit to supine with supervision. Call bell in reach, bed alarm on.  Therapy Documentation Precautions:  Precautions Precautions: Fall Precaution Comments: poor L side sensory awareness Restrictions Weight Bearing Restrictions: No   Therapy/Group: Individual Therapy  Ezekiel Slocumb 05/16/2021, 2:32 PM

## 2021-05-16 NOTE — Progress Notes (Signed)
PHYSICAL MEDICINE & REHABILITATION PROGRESS NOTE  Subjective/Complaints: Patient seen laying in bed this morning.  She states she slept well overnight.  She states she is pleased with her progress in ambulation with therapies.  ROS: Denies CP, SOB, N/V/D  Objective: Vital Signs: Blood pressure (!) 148/100, pulse 71, temperature 98 F (36.7 C), temperature source Oral, resp. rate 14, SpO2 95 %. No results found. No results for input(s): WBC, HGB, HCT, PLT in the last 72 hours. Recent Labs    05/16/21 0615  NA 131*  K 5.6*  CL 97*  CO2 26  GLUCOSE 116*  BUN 8  CREATININE 0.80  CALCIUM 8.9    Intake/Output Summary (Last 24 hours) at 05/16/2021 1035 Last data filed at 05/15/2021 1700 Gross per 24 hour  Intake 500 ml  Output --  Net 500 ml        Physical Exam: BP (!) 148/100 (BP Location: Right Arm)   Pulse 71   Temp 98 F (36.7 C) (Oral)   Resp 14   SpO2 95%   Constitutional: No distress . Vital signs reviewed. HENT: Normocephalic.  Atraumatic. Eyes: EOMI. No discharge. Cardiovascular: No JVD.  RRR. Respiratory: Normal effort.  No stridor.  Bilateral clear to auscultation. GI: Non-distended.  BS +. Skin: Warm and dry.  Intact. Psych: Normal mood.  Normal behavior. Musc: No edema in extremities.  No tenderness in extremities. Neuro: Alert Follows commands.  Left inattention, some improvement Fair insight and awareness.  Motor:LUE 4-4+/5 prox to distal with mild apraxia, improving LLE: 4--4/5 prox to distal, improving  Assessment/Plan: 1. Functional deficits which require 3+ hours per day of interdisciplinary therapy in a comprehensive inpatient rehab setting.  Physiatrist is providing close team supervision and 24 hour management of active medical problems listed below.  Physiatrist and rehab team continue to assess barriers to discharge/monitor patient progress toward functional and medical goals   Care Tool:  Bathing    Body parts bathed  by patient: Chest,Abdomen,Right upper leg,Left upper leg,Face,Left arm,Front perineal area,Buttocks,Right arm,Right lower leg,Left lower leg   Body parts bathed by helper: Right lower leg,Left lower leg     Bathing assist Assist Level: Contact Guard/Touching assist     Upper Body Dressing/Undressing Upper body dressing   What is the patient wearing?: Pull over shirt    Upper body assist Assist Level: Supervision/Verbal cueing    Lower Body Dressing/Undressing Lower body dressing      What is the patient wearing?: Underwear/pull up,Pants     Lower body assist Assist for lower body dressing: Moderate Assistance - Patient 50 - 74%     Toileting Toileting    Toileting assist Assist for toileting: Moderate Assistance - Patient 50 - 74%     Transfers Chair/bed transfer  Transfers assist     Chair/bed transfer assist level: Minimal Assistance - Patient > 75%     Locomotion Ambulation   Ambulation assist      Assist level: Minimal Assistance - Patient > 75% Assistive device: No Device Max distance: 18ft   Walk 10 feet activity   Assist  Walk 10 feet activity did not occur: Safety/medical concerns  Assist level: Moderate Assistance - Patient - 50 - 74% Assistive device: No Device   Walk 50 feet activity   Assist Walk 50 feet with 2 turns activity did not occur: Safety/medical concerns  Assist level: Moderate Assistance - Patient - 50 - 74% Assistive device: No Device    Walk 150 feet activity  Assist Walk 150 feet activity did not occur: Safety/medical concerns         Walk 10 feet on uneven surface  activity   Assist Walk 10 feet on uneven surfaces activity did not occur: Safety/medical concerns         Wheelchair     Assist Will patient use wheelchair at discharge?: No   Wheelchair activity did not occur: N/A         Wheelchair 50 feet with 2 turns activity    Assist    Wheelchair 50 feet with 2 turns activity did  not occur: N/A       Wheelchair 150 feet activity     Assist  Wheelchair 150 feet activity did not occur: N/A        Medical Problem List and Plan: 1.Left side hemiparesis to right thalamic infarction. Status post mechanical thrombectomy of right PCA P2 occlusion 04/19/2021. Status post loop recorder  Continue CIR  WHO nightly 2. Antithrombotics: -DVT/anticoagulation:SCDs  LLE doppler negative for DVT -antiplatelet therapy: Aspirin 81 mg daily, Plavix 75 mg dailyx3 weeks then aspirin alone 3. Pain Management:Tylenol as needed  Robaxin as needed started on 5/20, conitnue  Continue low dose gabapentin 100mg  qhs  Controlled on 6/3 4. Mood:Zoloft 100 mg daily, melatonin as needed. Provide emotional support -antipsychotic agents: N/A 5. Neuropsych: This patientiscapable of making decisions on herown behalf.  Discussed with Neuropsych - appreciate eval 6. Skin/Wound Care:Routine skin checks 7. Fluids/Electrolytes/Nutrition:Routine in and outs 8. Diabetes mellitus with hyperglycemia. Hemoglobin A1c 9.3.  Patient on Actos, Januvia 100 mg, Glucotrol XL5 mg daily,metformin 1000 mg twice daily prior to admission.   Currently onNovoLog 3units 3 times daily with meals  Glucophage at 500mg  bid, increased to 1000 BID on 5/20  Diabetic teaching  Levemir to 11 units BID on 5/30, increased to 12 BID on 6/2  Slightly elevated on 6/3, would not make further changes today given recent increase 9. Hypertension. Cozaar 25 mg twice daily, increased to 50 on 5/24, changed to 75 BID on 5/27, decreased to 50 BID on 5/31  Norvasc 2.5 started on 6/1, changed to qhs on 6/3  Hydralizine 10 qhs started on 5/31, increased on 6/1 10. Hyperlipidemia. Lipitor 11. Obesity. BMI 33.19. Dietary follow-up 12. GERD. Protonix 13. Constipation: scheduled senna-s, encourage liquids, fruits, vegetables -dulcolax suppository/fleet enema prn  Adjust  bowel meds as necessary 14. Cough: COVID negative -appears to be mostly pharyngeal, upper airway -Mucinex DM 15.  Hyponatremia  Sodium 131 on 6/3  Continue to monitor 16.  Leukocytosis: Resolved  Continue to monitor  Afebrile  Continue to monitor 17. Aortic valve mass: discussed finding with patient and plan for outpatient follow-up/removal.  18. Cough: since current regimen not helping, cough is very bothersome to her, imaging is negative for infection, and dextromethorphan may be contributing to her fatigue, changed to Mucinex to 19mL TID guaifensin-codeine.  19. AKI: Resolved  Cr 0.80 on 6/3  Encourage fluids 20.  Hyperkalemia  Lokelma ordered x1, labs ordered for tomorrow  LOS: 17 days A FACE TO FACE EVALUATION WAS PERFORMED  [name] [name] 05/16/2021, 10:35 AM

## 2021-05-17 LAB — URINALYSIS, ROUTINE W REFLEX MICROSCOPIC
Bilirubin Urine: NEGATIVE
Glucose, UA: NEGATIVE mg/dL
Ketones, ur: NEGATIVE mg/dL
Nitrite: POSITIVE — AB
Protein, ur: NEGATIVE mg/dL
Specific Gravity, Urine: 1.008 (ref 1.005–1.030)
WBC, UA: >50 WBC/hpf — ABNORMAL HIGH (ref 0–5)
pH: 8 (ref 5.0–8.0)

## 2021-05-17 LAB — BASIC METABOLIC PANEL
Anion gap: 10 (ref 5–15)
BUN: 9 mg/dL (ref 8–23)
CO2: 27 mmol/L (ref 22–32)
Calcium: 9.1 mg/dL (ref 8.9–10.3)
Chloride: 96 mmol/L — ABNORMAL LOW (ref 98–111)
Creatinine, Ser: 0.81 mg/dL (ref 0.44–1.00)
GFR, Estimated: >60 mL/min (ref >60)
Glucose, Bld: 132 mg/dL — ABNORMAL HIGH (ref 70–99)
Potassium: 4 mmol/L (ref 3.5–5.1)
Sodium: 133 mmol/L — ABNORMAL LOW (ref 135–145)

## 2021-05-17 LAB — GLUCOSE, CAPILLARY
Glucose-Capillary: 139 mg/dL — ABNORMAL HIGH (ref 70–99)
Glucose-Capillary: 134 mg/dL — ABNORMAL HIGH (ref 70–99)
Glucose-Capillary: 136 mg/dL — ABNORMAL HIGH (ref 70–99)
Glucose-Capillary: 90 mg/dL (ref 70–99)

## 2021-05-17 NOTE — Progress Notes (Signed)
[place] PHYSICAL MEDICINE & REHABILITATION PROGRESS NOTE  Subjective/Complaints: C/o pain in extending from left volar forearm into palm when exercising that hand, asks what may be causing this Cough much improved Asks with whom she will follow regarding her "heart mass"  ROS: Denies CP, SOB, N/V/D, +left arm pain  Objective: Vital Signs: Blood pressure (!) 145/64, pulse 69, temperature 98.4 F (36.9 C), temperature source Oral, resp. rate 17, SpO2 95 %. No results found. No results for input(s): WBC, HGB, HCT, PLT in the last 72 hours. Recent Labs    05/16/21 0615 05/17/21 0515  NA 131* 133*  K 5.6* 4.0  CL 97* 96*  CO2 26 27  GLUCOSE 116* 132*  BUN 8 9  CREATININE 0.80 0.81  CALCIUM 8.9 9.1    Intake/Output Summary (Last 24 hours) at 05/17/2021 1432 Last data filed at 05/17/2021 1331 Gross per 24 hour  Intake 720 ml  Output --  Net 720 ml        Physical Exam: BP (!) 145/64 (BP Location: Right Arm)   Pulse 69   Temp 98.4 F (36.9 C) (Oral)   Resp 17   SpO2 95%   Gen: no distress, normal appearing HEENT: oral mucosa pink and moist, NCAT Cardio: Reg rate Chest: normal effort, normal rate of breathing Abd: soft, non-distended Ext: no edema Psych: pleasant, normal affect Skin: intact Musc: No edema in extremities.  No tenderness in extremities. Neuro: Alert Follows commands.  Left inattention, some improvement Fair insight and awareness.  Motor:LUE 4-4+/5 prox to distal with mild apraxia, improving LLE: 4--4/5 prox to distal, improving  Assessment/Plan: 1. Functional deficits which require 3+ hours per day of interdisciplinary therapy in a comprehensive inpatient rehab setting.  Physiatrist is providing close team supervision and 24 hour management of active medical problems listed below.  Physiatrist and rehab team continue to assess barriers to discharge/monitor patient progress toward functional and medical goals   Care Tool:  Bathing     Body parts bathed by patient: Chest,Abdomen,Right upper leg,Left upper leg,Face,Left arm,Front perineal area,Buttocks,Right arm,Right lower leg,Left lower leg   Body parts bathed by helper: Right lower leg,Left lower leg     Bathing assist Assist Level: Contact Guard/Touching assist     Upper Body Dressing/Undressing Upper body dressing   What is the patient wearing?: Pull over shirt    Upper body assist Assist Level: Supervision/Verbal cueing    Lower Body Dressing/Undressing Lower body dressing      What is the patient wearing?: Underwear/pull up,Pants     Lower body assist Assist for lower body dressing: Minimal Assistance - Patient > 75%     Toileting Toileting    Toileting assist Assist for toileting: Moderate Assistance - Patient 50 - 74%     Transfers Chair/bed transfer  Transfers assist     Chair/bed transfer assist level: Minimal Assistance - Patient > 75%     Locomotion Ambulation   Ambulation assist      Assist level: Minimal Assistance - Patient > 75% Assistive device: No Device Max distance: 57ft   Walk 10 feet activity   Assist  Walk 10 feet activity did not occur: Safety/medical concerns  Assist level: Moderate Assistance - Patient - 50 - 74% Assistive device: No Device   Walk 50 feet activity   Assist Walk 50 feet with 2 turns activity did not occur: Safety/medical concerns  Assist level: Moderate Assistance - Patient - 50 - 74% Assistive device: No Device    Walk  150 feet activity   Assist Walk 150 feet activity did not occur: Safety/medical concerns         Walk 10 feet on uneven surface  activity   Assist Walk 10 feet on uneven surfaces activity did not occur: Safety/medical concerns         Wheelchair     Assist Will patient use wheelchair at discharge?: No   Wheelchair activity did not occur: N/A         Wheelchair 50 feet with 2 turns activity    Assist    Wheelchair 50 feet with 2  turns activity did not occur: N/A       Wheelchair 150 feet activity     Assist  Wheelchair 150 feet activity did not occur: N/A        Medical Problem List and Plan: 1.Left side hemiparesis to right thalamic infarction. Status post mechanical thrombectomy of right PCA P2 occlusion 04/19/2021. Status post loop recorder  Continue CIR  WHO nightly 2. Antithrombotics: -DVT/anticoagulation:SCDs  LLE doppler negative for DVT. Discussed with patient and she is reassured by this -antiplatelet therapy: Aspirin 81 mg daily, Plavix 75 mg dailyx3 weeks then aspirin alone 3. Left arm/hand pain: Discussed could be from tendonitis-overuse of flexors in this arm/hand. Recommended to start with icing 15mg  TID and will proceed to voltaren gel if needed. She is agreeable.  Tylenol as needed  Robaxin as needed started on 5/20, conitnue  Continue low dose gabapentin 100mg  qhs  Controlled on 6/4 4. Mood:Zoloft 100 mg daily, melatonin as needed. Provide emotional support -antipsychotic agents: N/A 5. Neuropsych: This patientiscapable of making decisions on herown behalf.  Discussed with Neuropsych - appreciate eval 6. Skin/Wound Care:Routine skin checks 7. Fluids/Electrolytes/Nutrition:Routine in and outs 8. Diabetes mellitus with hyperglycemia. Hemoglobin A1c 9.3.  Patient on Actos, Januvia 100 mg, Glucotrol XL5 mg daily,metformin 1000 mg twice daily prior to admission.   Currently onNovoLog 3units 3 times daily with meals  Glucophage at 500mg  bid, increased to 1000 BID on 5/20  Diabetic teaching  Levemir to 11 units BID on 5/30, increased to 12 BID on 6/2  Slightly elevated on 6/3, would not make further changes today given recent increase 9. Hypertension. Cozaar 25 mg twice daily, increased to 50 on 5/24, changed to 75 BID on 5/27, decreased to 50 BID on 5/31  Norvasc 2.5 started on 6/1, changed to qhs on 6/3  Hydralizine 10 qhs started on 5/31,  increased on 6/1 10. Hyperlipidemia. Lipitor 11. Obesity. BMI 33.19. Dietary follow-up 12. GERD. Protonix 13. Constipation: scheduled senna-s, encourage liquids, fruits, vegetables -dulcolax suppository/fleet enema prn  Adjust bowel meds as necessary 14. Cough: COVID negative -appears to be mostly pharyngeal, upper airway -Mucinex DM 15.  Hyponatremia  Sodium 131 on 6/3  Continue to monitor 16.  Leukocytosis: Resolved  Continue to monitor  Afebrile  Continue to monitor 17. Aortic valve mass: discussed finding with patient and plan for outpatient follow-up/removal.  18. Cough: since current regimen not helping, cough is very bothersome to her, imaging is negative for infection, and dextromethorphan may be contributing to her fatigue, changed to Mucinex to 39mL TID guaifensin-codeine. Improved, discuss changing to prn this week.  19. AKI: Resolved  Cr 0.80 on 6/3  Encourage fluids 20.  Hyperkalemia  Lokelma ordered x1. Discussed that K+ has normalized 6/4  LOS: 18 days A FACE TO FACE EVALUATION WAS PERFORMED  [name] 05/17/2021, 2:32 PM

## 2021-05-17 NOTE — Progress Notes (Signed)
Speech Language Pathology Daily Session Note  Patient Details  Name: [name] MRN: [id number] Date of Birth: 1956/11/11  Today's Date: 05/17/2021 SLP Individual Time: 1430-1515 SLP Individual Time Calculation (min): 45 min  Short Term Goals: Week 3: SLP Short Term Goal 1 (Week 3): Pt will complete complex problem solving tasks with supervision A verbal cues. SLP Short Term Goal 2 (Week 3): Pt will self-monitoring and self-correct functional errors in complex tasks with supervision A verbal cues, SLP Short Term Goal 3 (Week 3): Pt will demonstrate anticipatory awareness skills in listing 3 activites that are safe verus unsafe due to acute deficits with supervision A verbal cues. SLP Short Term Goal 4 (Week 3): Pt will demonstrate left attention (of left arm) during functional transfers (ex: turning) with min A verbal cues. SLP Short Term Goal 5 (Week 3): Pt will demonstrate alternating attention in functionals tasks with supervision A verbal cues.  Skilled Therapeutic Interventions: Pt seen for skilled ST with focus on cognitive goals. Pt completing moderately complex alternating attention task benefiting from min A verbal cues. Discussed safe vs unsafe behaviors at d/c with emphasis on how acute deficits will impact daily tasks. Pt demonstrates generally functional awareness of deficits in current environment, will benefit from ongoing education and training with focus on daughters home (d/c location). Pt left in bed with alarm set and all needs within reach. Cont ST POC.   Pain Pain Assessment Pain Scale: 0-10 Pain Score: 0-No pain  Therapy/Group: Individual Therapy  Dewaine Conger 05/17/2021, 2:58 PM

## 2021-05-18 LAB — GLUCOSE, CAPILLARY
Glucose-Capillary: 167 mg/dL — ABNORMAL HIGH (ref 70–99)
Glucose-Capillary: 101 mg/dL — ABNORMAL HIGH (ref 70–99)
Glucose-Capillary: 61 mg/dL — ABNORMAL LOW (ref 70–99)
Glucose-Capillary: 109 mg/dL — ABNORMAL HIGH (ref 70–99)
Glucose-Capillary: 234 mg/dL — ABNORMAL HIGH (ref 70–99)

## 2021-05-18 NOTE — Progress Notes (Signed)
Occupational Therapy Session Note  Patient Details  Name: [name] MRN: [id number] Date of Birth: Jun 15, 1956  Today's Date: 05/18/2021 OT Group Time: 1120-1200 OT Group Time Calculation (min): 40 min  Skilled Therapeutic Interventions/Progress Updates:    Pt engaged in therapeutic w/c level dance group focusing on patient choice, UE/LE strengthening, salience, activity tolerance, and social participation. Pt was guided through various dance-based exercises involving UEs/LEs and trunk. All music was selected by group members. Emphasis placed on Lt NMR and activity tolerance. Pt exhibited high levels of participation while seated, actively working on ROM/coordination of the Lt side which was visibly challenging. Min cues for Lt attention. She socially interacted with others and assisted with music selection. At end of session she was returned to the room by OT.    Therapy Documentation Precautions:  Precautions Precautions: Fall Precaution Comments: poor L side sensory awareness Restrictions Weight Bearing Restrictions: No Vital Signs: Therapy Vitals Temp: 97.9 F (36.6 C) Pulse Rate: 79 Resp: 18 BP: 127/73 Patient Position (if appropriate): Sitting Oxygen Therapy SpO2: 96 % O2 Device: Room Air Pain: no s/s pain during tx   ADL: ADL Eating: Set up Grooming: Setup Upper Body Bathing: Supervision/safety Where Assessed-Upper Body Bathing: Shower Lower Body Bathing: Minimal assistance Where Assessed-Lower Body Bathing: Shower Upper Body Dressing: Contact guard Where Assessed-Upper Body Dressing: Sitting at sink Lower Body Dressing: Moderate assistance Where Assessed-Lower Body Dressing: Sitting at sink,Standing at sink Toileting: Moderate assistance Toilet Transfer: Minimal assistance Toilet Transfer Method: Squat pivot Toilet Transfer Equipment: Energy manager: Environmental education officer Method: Neurosurgeon: Transfer tub bench,Grab bars     Therapy/Group: Group Therapy  [employer] 05/18/2021, 3:55 PM

## 2021-05-18 NOTE — Progress Notes (Signed)
Speech Language Pathology Daily Session Note  Patient Details  Name: [name] MRN: [id number] Date of Birth: 10-05-1956  Today's Date: 05/18/2021 SLP Individual Time: 0840-0900 SLP Individual Time Calculation (min): 20 min and Today's Date: 05/18/2021 SLP Missed Time: 25 Minutes Missed Time Reason: Patient fatigue  Short Term Goals: Week 3: SLP Short Term Goal 1 (Week 3): Pt will complete complex problem solving tasks with supervision A verbal cues. SLP Short Term Goal 2 (Week 3): Pt will self-monitoring and self-correct functional errors in complex tasks with supervision A verbal cues, SLP Short Term Goal 3 (Week 3): Pt will demonstrate anticipatory awareness skills in listing 3 activites that are safe verus unsafe due to acute deficits with supervision A verbal cues. SLP Short Term Goal 4 (Week 3): Pt will demonstrate left attention (of left arm) during functional transfers (ex: turning) with min A verbal cues. SLP Short Term Goal 5 (Week 3): Pt will demonstrate alternating attention in functionals tasks with supervision A verbal cues.  Skilled Therapeutic Interventions: Pt seen for skilled ST with focus on cognitive goals. She is very tired this AM, cues to maintain alertness and participation required. When SLP informed her yesterday of session time today pt stated "that's too early! I won't be awake". Pt and SLP discussing events of previous evening, scheduled therapy today and plans for the day. Continued focus on anticipatory awareness skills for current and discharge environment. Pt frequently falling into deep sleep and snoring during conversation. Session ended due to fatigue. Will talk to scheduling to plan for late morning/afternoon tx times.  Pt left in bed sleeping with alarm set and all needs within reach. Cont ST POC.   Pain Pain Assessment Pain Scale: 0-10 Pain Score: 0-No pain  Therapy/Group: Individual Therapy  Dewaine Conger 05/18/2021, 8:49 AM

## 2021-05-18 NOTE — Progress Notes (Signed)
Occupational Therapy Session Note  Patient Details  Name: [name] MRN: [id number] Date of Birth: [birth year]  Today's Date: 05/18/2021 OT Individual Time: 1253-1355 OT Individual Time Calculation (min): 62 min   Skilled Therapeutic Interventions/Progress Updates:    Pt greeted in bed, ADL needs met, motivated to go outside. She ambulated a short distance to w/c parked beside door using RW with CGA. Once seated, pt changed gripper socks, able to meet task demands on her own using figure 4 position with Min A only to make sure she had the most traction on the bottom of foot! Per pt, "I can't wait to tell Darrick Meigs that I put my socks on myself." Escorted pt to the outdoor patio and guided her through exercises using an unweighted bar, co-band applied to Lt side to increase ease of gripping. Pt did well with maintaining grip on the bar given min cues, engaging in exercises until reaching the point of fatigue for NMR/strengthening purposes. At times when pt lost her grip on the bar she was able to recognize this and correct with min cues. Sit<stand x3 completed using RW with CGA, pt performing mini squats, standing marches, and Rt>Lt weightshifting with Min balance assist. At end of session pt was returned to the room via w/c and completed another short distance ambulatory transfer back to the bed using device. She returned to bed and was left in care of RN for receiving scheduled medication. OT provided pt with an ice pack for pain relief in the Lt hand per pt request. She reports she has been "overworking" the left hand when not in therapy due to motivation to improve its function. Bed alarm set prior to departure.   Therapy Documentation Precautions:  Precautions Precautions: Fall Precaution Comments: poor L side sensory awareness Restrictions Weight Bearing Restrictions: No Vital Signs: Therapy Vitals Temp: 97.9 F (36.6 C) Pulse Rate: 79 Resp: 18 BP: 127/73 Patient Position (if  appropriate): Sitting Oxygen Therapy SpO2: 96 % O2 Device: Room Air   ADL: ADL Eating: Set up Grooming: Setup Upper Body Bathing: Supervision/safety Where Assessed-Upper Body Bathing: Shower Lower Body Bathing: Minimal assistance Where Assessed-Lower Body Bathing: Shower Upper Body Dressing: Contact guard Where Assessed-Upper Body Dressing: Sitting at sink Lower Body Dressing: Moderate assistance Where Assessed-Lower Body Dressing: Sitting at sink,Standing at sink Toileting: Moderate assistance Toilet Transfer: Minimal assistance Toilet Transfer Method: Squat pivot Toilet Transfer Equipment: Energy manager: Environmental education officer Method: Education officer, environmental: Transfer tub bench,Grab bars   Therapy/Group: Individual Therapy  Alicia Ackert A Jamauri Kruzel 05/18/2021, 3:47 PM

## 2021-05-19 LAB — BASIC METABOLIC PANEL
Anion gap: 8 (ref 5–15)
BUN: 10 mg/dL (ref 8–23)
CO2: 30 mmol/L (ref 22–32)
Calcium: 9.3 mg/dL (ref 8.9–10.3)
Chloride: 97 mmol/L — ABNORMAL LOW (ref 98–111)
Creatinine, Ser: 0.96 mg/dL (ref 0.44–1.00)
GFR, Estimated: >60 mL/min (ref >60)
Glucose, Bld: 182 mg/dL — ABNORMAL HIGH (ref 70–99)
Potassium: 4.3 mmol/L (ref 3.5–5.1)
Sodium: 135 mmol/L (ref 135–145)

## 2021-05-19 LAB — GLUCOSE, CAPILLARY
Glucose-Capillary: 148 mg/dL — ABNORMAL HIGH (ref 70–99)
Glucose-Capillary: 112 mg/dL — ABNORMAL HIGH (ref 70–99)
Glucose-Capillary: 147 mg/dL — ABNORMAL HIGH (ref 70–99)
Glucose-Capillary: 116 mg/dL — ABNORMAL HIGH (ref 70–99)

## 2021-05-19 NOTE — Progress Notes (Signed)
Occupational Therapy Session Note  Patient Details  Name: [name] MRN: [id number] Date of Birth: November 07, 1956  Today's Date: 05/19/2021 OT Individual Time: 1102-1204 OT Individual Time Calculation (min): 62 min    Short Term Goals: Week 2:  OT Short Term Goal 1 (Week 2): STGs = LTGs  Skilled Therapeutic Interventions/Progress Updates:    Pt in bed to start session.  She was able to complete supine to sit with supervision and then completed stand pivot transfer to the wheelchair with min assist using the RW and mod instructional cueing for left hand placement and sequencing.  She was unable to maintain hand position on the left splint as it would flex off of the left side.  Therapist placed dycem on the hand splint and it seemed to assist with maintaining position.  Therapist also placed therapy band on the left wheel in hopes of increasing grip for wheelchair mobility.  She still needed mod assist overall to propel the wheelchair on the left side secondary to decreased control and decreased sensation.  Once in the gym she transferred stand pivot with min assist to the therapy mat with use of the RW.  She then worked on Research scientist (life sciences) with use of a therapy board and having to maintain her hand on the board as therapist held the board vertically.  Noted motor impersistence with pt demonstrating decreased sustained visual attention when attempting use.  She was able to maintain but fatigued quickly.  Had her complete Box and Blocks test as well where she completed 11 and 14 blocks in 1 minute using the LUE.  Decreased coordination noted with pt demonstrating decreased ability to grade movements based on the given task.  Finished session with transition back to the room and transfer to the bed at min assist.  Call button and phone in reach with safety belt in place.    Therapy Documentation Precautions:  Precautions Precautions: Fall Precaution Comments: poor L side sensory  awareness Restrictions Weight Bearing Restrictions: No   Pain: Pain Assessment Pain Scale: Faces Pain Score: 0-No pain Pain Location: Arm ADL: See Care Tool Section for some details of mobility and selfcare  Therapy/Group: Individual Therapy  Jarelle Ates OTR/L 05/19/2021, 12:49 PM

## 2021-05-19 NOTE — Progress Notes (Signed)
Speech Language Pathology Daily Session Note  Patient Details  Name: [name] MRN: [id number] Date of Birth: 1956/01/20  Today's Date: 05/19/2021 SLP Individual Time: 0932-1030 SLP Individual Time Calculation (min): 58 min  Short Term Goals: Week 3: SLP Short Term Goal 1 (Week 3): Pt will complete complex problem solving tasks with supervision A verbal cues. SLP Short Term Goal 2 (Week 3): Pt will self-monitoring and self-correct functional errors in complex tasks with supervision A verbal cues, SLP Short Term Goal 3 (Week 3): Pt will demonstrate anticipatory awareness skills in listing 3 activites that are safe verus unsafe due to acute deficits with supervision A verbal cues. SLP Short Term Goal 4 (Week 3): Pt will demonstrate left attention (of left arm) during functional transfers (ex: turning) with min A verbal cues. SLP Short Term Goal 5 (Week 3): Pt will demonstrate alternating attention in functionals tasks with supervision A verbal cues.  Skilled Therapeutic Interventions: Pt seen for skilled ST with focus on cognitive goals. Pt continues to c/o feeling tired despite sleeping well at night, agreeable to sit EOB to complete therapeutic activities. Pt initiating moderately complex problem solving task with language activity stimulus requiring max A fading to mod A verbal cues before pt stating "this is too hard. I wouldn't have been able to do this before my stroke". SLP facilitating alternating attention task by providing mod fading to min A verbal cues for 100% accuracy. Pt left in bed with alarm set and all needs within reach. Cont ST POC.    Pain Pain Assessment Pain Scale: 0-10 Pain Score: 3  Pain Location: Arm  Therapy/Group: Individual Therapy  Dewaine Conger 05/19/2021, 10:05 AM

## 2021-05-19 NOTE — Progress Notes (Signed)
Physical Therapy Session Note  Patient Details  Name: [name] MRN: [id number] Date of Birth: [birth year]  Today's Date: 05/19/2021 PT Individual Time: 1300-1356 PT Individual Time Calculation (min): 56 min   Short Term Goals: Week 3:  PT Short Term Goal 1 (Week 3): Pt will complete bed<>chair transfers consistently with CGA and LRAD PT Short Term Goal 2 (Week 3): Pt will ambulate 160f with minA and LRAD PT Short Term Goal 3 (Week 3): Pt will demonstrate improved L attention into functional mobility tasks  Skilled Therapeutic Interventions/Progress Updates:    Pt greeted supine in bed to start session, denies pain and is agreeable to PT tx. Lengthy discussion at start of session regarding DC planning, home safety, and DME rec's - pt appreciative of feedback and education. Supine<>sit with supervision with NO bed features but cues needed for LUE awareness. Performed stand<>pivot towards paretic L side with minA to w/c and wheeled to main rehab gym for time management. Gait training completed - ambulated 1034f+ 12592fith CGA (fading to minA after >30f98fnd RW - improved ability to initiate hip/knee flexors during swing and ability to monitor LUE awareness on RW (Dycem grip to RW). Next, completed stair training where she navigated up/down 4 + 8 (6 inch) steps (seated rest) with R hand rail and minA - needing frequent cues for stepping pattern with stepping up with R foot and stepping down with L foot 1st. Returned to room and completed bed mobility with supervision for L hemibody awareness. She remained supine in bed at end of session with all needs met and bed alarm active.  Therapy Documentation Precautions:  Precautions Precautions: Fall Precaution Comments: poor L side sensory awareness Restrictions Weight Bearing Restrictions: No General:    Therapy/Group: Individual Therapy  ChriAlger Simons/2022, 7:47 AM

## 2021-05-19 NOTE — Progress Notes (Signed)
[place] PHYSICAL MEDICINE & REHABILITATION PROGRESS NOTE  Subjective/Complaints:  No issues overnight , working with SLP   ROS: Denies CP, SOB, N/V/D, +left arm pain  Objective: Vital Signs: Blood pressure (!) 147/68, pulse 70, temperature 98.4 F (36.9 C), temperature source Oral, resp. rate 18, SpO2 96 %. No results found. No results for input(s): WBC, HGB, HCT, PLT in the last 72 hours. Recent Labs    05/17/21 0515  NA 133*  K 4.0  CL 96*  CO2 27  GLUCOSE 132*  BUN 9  CREATININE 0.81  CALCIUM 9.1    Intake/Output Summary (Last 24 hours) at 05/19/2021 0957 Last data filed at 05/18/2021 2300 Gross per 24 hour  Intake 980 ml  Output --  Net 980 ml        Physical Exam: BP (!) 147/68 (BP Location: Right Arm)   Pulse 70   Temp 98.4 F (36.9 C) (Oral)   Resp 18   SpO2 96%    General: No acute distress Mood and affect are appropriate Heart: Regular rate and rhythm no rubs murmurs or extra sounds Lungs: Clear to auscultation, breathing unlabored, no rales or wheezes Abdomen: Positive bowel sounds, soft nontender to palpation, nondistended Extremities: No clubbing, cyanosis, or edema Skin: No evidence of breakdown, no evidence of rash Sensation reduce but is able to identify LT and proprioception   Follows commands.  Left inattention, some improvement Fair insight and awareness.  Motor:LUE 4-4+/5 prox to distal with mild apraxia, improving LLE: 4--4/5 prox to distal, improving  Assessment/Plan: 1. Functional deficits which require 3+ hours per day of interdisciplinary therapy in a comprehensive inpatient rehab setting.  Physiatrist is providing close team supervision and 24 hour management of active medical problems listed below.  Physiatrist and rehab team continue to assess barriers to discharge/monitor patient progress toward functional and medical goals   Care Tool:  Bathing    Body parts bathed by patient: Chest,Abdomen,Right upper leg,Left  upper leg,Face,Left arm,Front perineal area,Buttocks,Right arm,Right lower leg,Left lower leg   Body parts bathed by helper: Right lower leg,Left lower leg     Bathing assist Assist Level: Contact Guard/Touching assist     Upper Body Dressing/Undressing Upper body dressing   What is the patient wearing?: Pull over shirt    Upper body assist Assist Level: Supervision/Verbal cueing    Lower Body Dressing/Undressing Lower body dressing      What is the patient wearing?: Underwear/pull up,Pants     Lower body assist Assist for lower body dressing: Minimal Assistance - Patient > 75%     Toileting Toileting    Toileting assist Assist for toileting: Moderate Assistance - Patient 50 - 74%     Transfers Chair/bed transfer  Transfers assist     Chair/bed transfer assist level: Minimal Assistance - Patient > 75%     Locomotion Ambulation   Ambulation assist      Assist level: Minimal Assistance - Patient > 75% Assistive device: No Device Max distance: 28ft   Walk 10 feet activity   Assist  Walk 10 feet activity did not occur: Safety/medical concerns  Assist level: Moderate Assistance - Patient - 50 - 74% Assistive device: No Device   Walk 50 feet activity   Assist Walk 50 feet with 2 turns activity did not occur: Safety/medical concerns  Assist level: Moderate Assistance - Patient - 50 - 74% Assistive device: No Device    Walk 150 feet activity   Assist Walk 150 feet activity did not occur:  Safety/medical concerns         Walk 10 feet on uneven surface  activity   Assist Walk 10 feet on uneven surfaces activity did not occur: Safety/medical concerns         Wheelchair     Assist Will patient use wheelchair at discharge?: No   Wheelchair activity did not occur: N/A         Wheelchair 50 feet with 2 turns activity    Assist    Wheelchair 50 feet with 2 turns activity did not occur: N/A       Wheelchair 150 feet  activity     Assist  Wheelchair 150 feet activity did not occur: N/A        Medical Problem List and Plan: 1.Left side hemiparesis to right thalamic infarction. Status post mechanical thrombectomy of right PCA P2 occlusion 04/19/2021. Status post loop recorder  Continue CIR  WHO nightly 2. Antithrombotics: -DVT/anticoagulation:SCDs  LLE doppler negative for DVT. Discussed with patient and she is reassured by this -antiplatelet therapy: Aspirin 81 mg daily, Plavix 75 mg daily until cardiac surgery  3. Left arm/hand pain: Discussed could be from tendonitis-overuse of flexors in this arm/hand. Recommended to start with icing 15mg  TID and will proceed to voltaren gel if needed. She is agreeable.  Tylenol as needed  Robaxin as needed started on 5/20, conitnue  Continue low dose gabapentin 100mg  qhs  Controlled on 6/4 4. Mood:Zoloft 100 mg daily, melatonin as needed. Provide emotional support -antipsychotic agents: N/A 5. Neuropsych: This patientiscapable of making decisions on herown behalf.  Discussed with Neuropsych - appreciate eval 6. Skin/Wound Care:Routine skin checks 7. Fluids/Electrolytes/Nutrition:Routine in and outs 8. Diabetes mellitus with hyperglycemia. Hemoglobin A1c 9.3.  Patient on Actos, Januvia 100 mg, Glucotrol XL5 mg daily,metformin 1000 mg twice daily prior to admission.   Currently onNovoLog 3units 3 times daily with meals  Glucophage at 500mg  bid, increased to 1000 BID on 5/20  Diabetic teaching  Levemir to 11 units BID on 5/30, increased to 12 BID on 6/2 CBG (last 3)  Recent Labs    05/18/21 1723 05/18/21 2114 05/19/21 0637  GLUCAP 109* 234* 148*  some lability cont current dose   9. Hypertension. Cozaar 25 mg twice daily, increased to 50 on 5/24, changed to 75 BID on 5/27, decreased to 50 BID on 5/31  Norvasc 2.5 started on 6/1, changed to qhs on 6/3  Hydralizine 10 qhs started on 5/31, increased on  6/1 Vitals:   05/18/21 2021 05/19/21 0304  BP: (!) 154/84 (!) 147/68  Pulse: 72 70  Resp: 16 18  Temp: 98 F (36.7 C) 98.4 F (36.9 C)  SpO2: 95% 96%  will increase norvasc to 5mg   10. Hyperlipidemia. Lipitor 11. Obesity. BMI 33.19. Dietary follow-up 12. GERD. Protonix 13. Constipation: scheduled senna-s, encourage liquids, fruits, vegetables -dulcolax suppository/fleet enema prn  Adjust bowel meds as necessary 14. Cough: COVID negative -appears to be mostly pharyngeal, upper airway -Mucinex DM 15.  Hyponatremia  Sodium 131 on 6/3  Continue to monitor 16.  Leukocytosis: Resolved  Continue to monitor  Afebrile  Continue to monitor 17. Aortic valve mass: discussed finding with patient and plan for outpatient follow-up/removal.  18. Cough: since current regimen not helping, cough is very bothersome to her, imaging is negative for infection, and dextromethorphan may be contributing to her fatigue, changed to Mucinex to 31mL TID guaifensin-codeine. Improved, discuss changing to prn this week.  19. AKI: Resolved  Cr 0.80 on 6/3  Encourage fluids 20.  Hyperkalemia  Lokelma ordered x1. Discussed that K+ has normalized 6/4  LOS: 20 days A FACE TO [place] E [name] 05/19/2021, 9:57 AM

## 2021-05-20 LAB — URINE CULTURE: Culture: >=100000 — AB

## 2021-05-20 LAB — GLUCOSE, CAPILLARY
Glucose-Capillary: 114 mg/dL — ABNORMAL HIGH (ref 70–99)
Glucose-Capillary: 89 mg/dL (ref 70–99)
Glucose-Capillary: 125 mg/dL — ABNORMAL HIGH (ref 70–99)
Glucose-Capillary: 96 mg/dL (ref 70–99)

## 2021-05-20 MED ORDER — GUAIFENESIN-CODEINE 100-10 MG/5ML PO SOLN: 5.0000 mL | Freq: Three times a day (TID) | ORAL | Status: DC | PRN

## 2021-05-20 MED ORDER — SACCHAROMYCES BOULARDII 250 MG PO CAPS
250.0000 mg | ORAL_CAPSULE | Freq: Two times a day (BID) | ORAL | Status: DC
  Administered 2021-05-20 – 2021-05-27 (×14): 250 mg via ORAL
  Filled 2021-05-20 (×14): qty 1

## 2021-05-20 MED ORDER — SULFAMETHOXAZOLE-TRIMETHOPRIM 800-160 MG PO TABS
1.0000 | ORAL_TABLET | Freq: Two times a day (BID) | ORAL | Status: DC
  Administered 2021-05-20 – 2021-05-27 (×14): 1 via ORAL
  Filled 2021-05-20 (×14): qty 1

## 2021-05-20 NOTE — Progress Notes (Addendum)
Occupational Therapy Session Note  Patient Details  Name: [name] MRN: [id number] Date of Birth: 05/28/1956  Today's Date: 05/20/2021 OT Individual Time: 1292-9090 OT Individual Time Calculation (min): 54 min    Short Term Goals: Week 1:  OT Short Term Goal 1 (Week 1): pt will complete squat pivot transfers with mod A. OT Short Term Goal 1 - Progress (Week 1): Met OT Short Term Goal 2 (Week 1): Pt will be able to hold static stand with min A. OT Short Term Goal 2 - Progress (Week 1): Met OT Short Term Goal 3 (Week 1): Pt will don shirt with min A OT Short Term Goal 3 - Progress (Week 1): Met OT Short Term Goal 4 (Week 1): Pt will be able to don pants over feet with min A. OT Short Term Goal 4 - Progress (Week 1): Met OT Short Term Goal 5 (Week 1): Pt will be able to pull pants over hips with mod A OT Short Term Goal 5 - Progress (Week 1): Met Week 2:  OT Short Term Goal 1 (Week 2): STGs = LTGs  Skilled Therapeutic Interventions/Progress Updates:    Pt supine and asleep in bed, easily aroused and pt requesting to shower and get into clean clothes.  Also complaining of left hand pain with resistive gripping.  No triggering noted in palm of hand when palpated during grip.  Nursing and MD notified of pts complaints.  Discussed dc planning including expected social support.  Pt reports her dtr will be there intermittently but does work and dtrs boyfriend will be home but she does not feel comfortable with him assisting her with ADLs/functional mobility.    Pt completed supine to sit with supervision.  CGA with use of RW to ambulate to bathroom and complete toilet transfer.  Toileting with min assist due to LOB posteriorly when pt reaching back with RUE for pericare.  Pt ambulated a few steps and pivoted to shower bench in walk-in shower needing min assist and max multimodal cues to ensure safe RW management and to step back with RLE.  Pt doffed all clothing with supervision.  Bathed UB  and LB with CGA and VCs to increase pts attention to LUE for safety.  Pt donned shirt with CGA to prevent bumping LUE on hard surfaces in shower.  Pt required mod assist to donn brief and pants at sit<>stand level.  Pt ambulated back to EOB with CGA using RW and VCs needed for safe stand to sit technique.  Sit to supine with supervision. Call bell in reach, bed alarm on.  Therapy Documentation Precautions:  Precautions Precautions: Fall Precaution Comments: poor L side sensory awareness Restrictions Weight Bearing Restrictions: No   Therapy/Group: Individual Therapy  Ezekiel Slocumb 05/20/2021, 3:07 PM

## 2021-05-20 NOTE — Progress Notes (Addendum)
Patient ID: [name], female   DOB: 1956-12-10, 65 y.o.   MRN: [id number]  Spoke with daughter regarding setting up a time for OT education. 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 She and brother are coming in Sunday for PT education. Preparing for Dc 6/14.  3:15 PM Message left for daughter of therapy schedule tomorrow since pt said she could be here tomorrow. See if she comes in for therapies tomorrow.

## 2021-05-20 NOTE — Progress Notes (Signed)
Physical Therapy Session Note  Patient Details  Name: [name] MRN: [id number]  Date of Birth: 1956/07/29  Today's Date: 05/20/2021 PT Individual Time: 1540-0867 + 1415-1455 PT Individual Time Calculation (min): 56 min  + 40 min  Short Term Goals: Week 3:  PT Short Term Goal 1 (Week 3): Pt will complete bed<>chair transfers consistently with CGA and LRAD PT Short Term Goal 2 (Week 3): Pt will ambulate 175ft with minA and LRAD PT Short Term Goal 3 (Week 3): Pt will demonstrate improved L attention into functional mobility tasks   Skilled Therapeutic Interventions/Progress Updates:     1st session: Pt received supine in bed to start PT tx - reports LUE hand pain and request's ice after session. Supine<>sit completed with supervision with use of bed rail. Donned regular socks and her new tennis shoes with totalA for time management. Pt requesting to use bathroom - ambulatory transfer from her bed to 3-1 BSC over toilet with RW and CGA - cues for increasing L step length and safety with RW while navigating tight spaces in her room. Pt able to manage pants in standing with minA for balance. Pt continent of B & B, charted in flowsheets. Required mod assist for posterior pericare for thoroughness - discussed options for posterior pericare AD at home and will defer to OT. Pt requesting to brush teeth and wash up at the sink - while seated in w/c she required setupA for toothbrush and washcloth to wash face. Pt then wheeled to main rehab gym with [place] for time management. Gait training 148ft + 23ft with CGA and RW (fading to minA with fatigue) - similar cues as above with focus on "heel-toe" progressions for LLE and RW management. Completed stair training where she navigated up/down x4 steps with R hand rail support and minA for balance - pt able to recall proper sequencing with stepping up with R foot during ascent and down with L foot during descent - no knee buckling noted but unsteadiness  present. Pt wheeled back to room for time management and completed stand<>pivot transfer with minA back to bed. Removed tennis shoes with totalA.  Able to complete bed mobility with supervision and supported LUE with pillow. All needs in reach and bed alarm on.   2nd session: Pt received supine in bed, sleeping on arrival but awakens easily to voice. She's agreeable to PT tx but requires a few extra minutes to "wake up." Performed supine<>sit with supervision with use of bed rail. Once sitting EOB, she was able to don both socks and slip-on shoes with setupA via figure-4 techniques (!) - pt very pleased. Stand<>pivot transfer with minA and no AD from EOB to w/c. At this point, MD arrived for rounding and answered pt's questions/concerns. CSW also arriving and PT collaborated to discuss DC planning, DME rec's, and family ed/training. Pt wheeled dependently to main rehab gym for time management. With time remaining, completed gait training where she ambulated 264ft with CGA (fading to minA after 138ft) and RW on level ground. She benefits from cues for improving L step length, L heel-toe progressions, and keeping body within walker frame. She was wheeled back to the room and completed ambulatory transfer with minA and no AD from w/c to EOB. Bed mobility completed with supervision and she remained supine in bed with needs in reach and bed alarm on.   Therapy Documentation Precautions:  Precautions Precautions: Fall Precaution Comments: poor L side sensory awareness Restrictions Weight Bearing Restrictions: No General:  Therapy/Group: Individual Therapy  [name] 05/20/2021, 7:37 AM

## 2021-05-20 NOTE — Patient Care Conference (Signed)
Inpatient RehabilitationTeam Conference and Plan of Care Update Date: 05/20/2021   Time: 13:43 PM    Patient Name: [name]      Medical Record Number: [id number]  Date of Birth: 02-Oct-1956 Sex: Female         Room/Bed: 4W21C/4W21C-01 Payor Info: Payor: [employer] EMPLOYEE / Plan: [employer] / Product Type: *No Product type* /    Admit Date/Time:  04/29/2021  2:23 PM  Primary Diagnosis:  Right thalamic infarction [facility])  Hospital Problems: Principal Problem:   Right thalamic infarction [facility]) Active Problems:   Leukocytosis   Hyponatremia   Essential hypertension   Slow transit constipation   Hemiparesis affecting left side as late effect of stroke ([place])   Uncontrolled type 2 diabetes mellitus with hyperglycemia ([place])   Labile blood glucose   Chronic bilateral low back pain without sciatica   Hyperkalemia    Expected Discharge Date: Expected Discharge Date: 05/27/21  Team Members Present: Physician leading conference: Dr. Leeroy Cha Care Coodinator Present: Dorien Chihuahua, RN, BSN, CRRN;[name] Dupree, LCSW Nurse Present: Dorien Chihuahua, RN PT Present: Leavy Cella, PT;Christian Manhard, PT OT Present: Willeen Cass, OT SLP Present: Charolett Bumpers, SLP PPS Coordinator present : Ileana Ladd, PT     Current Status/Progress Goal Weekly Team Focus  Bowel/Bladder   Patient is continent of bowel and bladder  Patient will remain continent of bowel and bladder  Will assess qshift and PRN   Swallow/Nutrition/ Hydration             ADL's   CGA-supervision UB ADLs; min A LB ADLs; min A- CGA functional mobility using RW with walker splint on left side, with frequent cue's needed to attend to left side and for safety awareness. Pt also exhibits moderate distractibility.  CGA LB dressing and toileting; min assist tranfers  ADL training, neuro re-ed LUE/LLE, safety awareness training, functional mobility using RW ;Issued left heelbow sleeve to protect LUE; Also applied dycem  to left walker splint to prevent hand from slipping during mobility.   Mobility   Supevision bed mobility, CGA sit<.stand transfers wtih RW (using hand splint with dycem to improve LUE grip), minA gait with RW up to 11ft. Able to perform 8 steps with minA and 1 hand rail. Continues to show poor LUE awareness/inattention  supervision/CGA overall with min assist for gait.  Family training (set for Sunday 6/12), L UE and LE NMR, L inattention, functional transfers, stair and gait training.   Communication             Safety/Cognition/ Behavioral Observations  min-supervision A  Supervision A  complex problem solving, left attention/safety awareness   Pain   Patient reports no pain at this time  Patient will remain pain free  Will assess qshift and PRN   Skin   Patient's skin is intact  Patient's skin will remain intact  Will assess qshift and PRN     Discharge Planning:  Working on setting up family education in prepartion for discharge home. Daughter and son to come in and learn her care prior to discharge   Team Discussion: Hypokalemia normalized. CBGs better controlled.  Patient on target to meet rehab goals: yes, currently Min assist with RW 150', manage steps with a handrail. Goals set for CGA - Min assist. Requires help with toileting and cues to monitor the left side; poor left attention. SLP goals set for min assist due to left inattention.   *See Care Plan and progress notes for long and  short-term goals.   Revisions to Treatment Plan:   Teaching Needs: Transfers, toileting, medications, secondary stroke risk management, etc.   Current Barriers to Discharge: Decreased caregiver support and Home enviroment access/layout  Possible Resolutions to Barriers: OP follow up Family education    Medical Summary Current Status: hypokalemia, AKI, aortic valve mass, cough  Barriers to Discharge: Medical stability  Barriers to Discharge Comments: hypokalemia, AKI, aortic valve  mass, cough Possible Resolutions to [employer] Focus: hypokalemia resolved with lokelma, AKI resolved, discussed outpatient f/u for aortic valve pass, discusse weaning guafenisin-codeine this week.   Continued Need for Acute Rehabilitation Level of Care: The patient requires daily medical management by a physician with specialized training in physical medicine and rehabilitation for the following reasons: Direction of a multidisciplinary physical rehabilitation program to maximize functional independence : Yes Medical management of patient stability for increased activity during participation in an intensive rehabilitation regime.: Yes Analysis of laboratory values and/or radiology reports with any subsequent need for medication adjustment and/or medical intervention. : Yes   I attest that I was present, lead the team conference, and concur with the assessment and plan of the team.   Dorien Chihuahua B 05/20/2021, 3:34 PM

## 2021-05-20 NOTE — Progress Notes (Signed)
[facility] PHYSICAL MEDICINE & REHABILITATION PROGRESS NOTE  Subjective/Complaints: Discussed UTI; starting Bactrim and probiotic She is progressing well with therapy here.  BP elevated to 166/72 Willing to try cough medicine PRN  ROS: Denies CP, SOB, N/V/D, +left arm pain, cough improved  Objective: Vital Signs: Blood pressure (!) 166/72, pulse 83, temperature (!) 97.5 F (36.4 C), temperature source Oral, resp. rate 18, SpO2 97 %. No results found. No results for input(s): WBC, HGB, HCT, PLT in the last 72 hours. Recent Labs    05/19/21 1033  NA 135  K 4.3  CL 97*  CO2 30  GLUCOSE 182*  BUN 10  CREATININE 0.96  CALCIUM 9.3    Intake/Output Summary (Last 24 hours) at 05/20/2021 1232 Last data filed at 05/19/2021 1838 Gross per 24 hour  Intake 537 ml  Output --  Net 537 ml        Physical Exam: BP (!) 166/72 (BP Location: Right Arm)   Pulse 83   Temp (!) 97.5 F (36.4 C) (Oral)   Resp 18   SpO2 97%   Gen: no distress, normal appearing HEENT: oral mucosa pink and moist, NCAT Cardio: Reg rate Chest: normal effort, normal rate of breathing Abd: soft, non-distended Ext: no edema Psych: pleasant, normal affect Skin: intact Neuro: Sensation reduce but is able to identify LT and proprioception   Follows commands.  Left inattention, some improvement Fair insight and awareness.  Motor:LUE 4-4+/5 prox to distal with mild apraxia, improving LLE: 4--4/5 prox to distal, improving  Assessment/Plan: 1. Functional deficits which require 3+ hours per day of interdisciplinary therapy in a comprehensive inpatient rehab setting.  Physiatrist is providing close team supervision and 24 hour management of active medical problems listed below.  Physiatrist and rehab team continue to assess barriers to discharge/monitor patient progress toward functional and medical goals   Care Tool:  Bathing    Body parts bathed by patient: Chest,Abdomen,Right upper leg,Left upper  leg,Face,Left arm,Front perineal area,Buttocks,Right arm,Right lower leg,Left lower leg   Body parts bathed by helper: Right lower leg,Left lower leg     Bathing assist Assist Level: Contact Guard/Touching assist     Upper Body Dressing/Undressing Upper body dressing   What is the patient wearing?: Pull over shirt    Upper body assist Assist Level: Supervision/Verbal cueing    Lower Body Dressing/Undressing Lower body dressing      What is the patient wearing?: Underwear/pull up,Pants     Lower body assist Assist for lower body dressing: Minimal Assistance - Patient > 75%     Toileting Toileting    Toileting assist Assist for toileting: Moderate Assistance - Patient 50 - 74%     Transfers Chair/bed transfer  Transfers assist     Chair/bed transfer assist level: Minimal Assistance - Patient > 75%     Locomotion Ambulation   Ambulation assist      Assist level: Minimal Assistance - Patient > 75% Assistive device: Walker-rolling Max distance: 144ft   Walk 10 feet activity   Assist  Walk 10 feet activity did not occur: Safety/medical concerns  Assist level: Minimal Assistance - Patient > 75% Assistive device: Walker-rolling   Walk 50 feet activity   Assist Walk 50 feet with 2 turns activity did not occur: Safety/medical concerns  Assist level: Minimal Assistance - Patient > 75% Assistive device: Walker-rolling    Walk 150 feet activity   Assist Walk 150 feet activity did not occur: Safety/medical concerns  Assist level: Minimal Assistance -  Patient > 75% Assistive device: Walker-rolling    Walk 10 feet on uneven surface  activity   Assist Walk 10 feet on uneven surfaces activity did not occur: Safety/medical concerns         Wheelchair     Assist Will patient use wheelchair at discharge?: No   Wheelchair activity did not occur: N/A         Wheelchair 50 feet with 2 turns activity    Assist    Wheelchair 50 feet  with 2 turns activity did not occur: N/A       Wheelchair 150 feet activity     Assist  Wheelchair 150 feet activity did not occur: N/A        Medical Problem List and Plan: 1.Left side hemiparesis to right thalamic infarction. Status post mechanical thrombectomy of right PCA P2 occlusion 04/19/2021. Status post loop recorder  Continue CIR  WHO nightly 2. Antithrombotics: -DVT/anticoagulation:SCDs  LLE doppler negative for DVT. Discussed with patient and she is reassured by this -antiplatelet therapy: Aspirin 81 mg daily, Plavix 75 mg daily until cardiac surgery  3. Left arm/hand pain: Discussed could be from tendonitis-overuse of flexors in this arm/hand. Recommended to start with icing 15mg  TID and will proceed to voltaren gel if needed. She is agreeable.  Tylenol as needed  Robaxin as needed started on 5/20, conitnue  Continue low dose gabapentin 100mg  qhs  Controlled on 6/7 4. Mood:Zoloft 100 mg daily, melatonin as needed. Provide emotional support -antipsychotic agents: N/A 5. Neuropsych: This patientiscapable of making decisions on herown behalf.  Discussed with Neuropsych - appreciate eval 6. Skin/Wound Care:Routine skin checks 7. Fluids/Electrolytes/Nutrition:Routine in and outs 8. Diabetes mellitus with hyperglycemia. Hemoglobin A1c 9.3.  Patient on Actos, Januvia 100 mg, Glucotrol XL5 mg daily,metformin 1000 mg twice daily prior to admission.   Currently onNovoLog 3units 3 times daily with meals  Glucophage at 500mg  bid, increased to 1000 BID on 5/20  Diabetic teaching  Levemir to 11 units BID on 5/30, increased to 12 BID on 6/2 CBG (last 3)  Recent Labs    05/19/21 2113 05/20/21 0611 05/20/21 1210  GLUCAP 116* 114* 89  some lability cont current dose   9. Hypertension. Cozaar 25 mg twice daily, increased to 50 on 5/24, changed to 75 BID on 5/27, decreased to 50 BID on 5/31  Norvasc 2.5 started on 6/1, changed  to qhs on 6/3  Hydralizine 10 qhs started on 5/31, increased on 6/1 Vitals:   05/19/21 1930 05/20/21 0442  BP: (!) 163/71 (!) 166/72  Pulse: 71 83  Resp: 18 18  Temp: 99.8 F (37.7 C) (!) 97.5 F (36.4 C)  SpO2: 97% 97%  will increase norvasc to 5mg , continue to monitor for effects of this change 10. Hyperlipidemia. Lipitor 11. Obesity. BMI 33.19. Dietary follow-up 12. GERD. Protonix 13. Constipation: scheduled senna-s, encourage liquids, fruits, vegetables -dulcolax suppository/fleet enema prn  Adjust bowel meds as necessary 14. Cough: COVID negative -appears to be mostly pharyngeal, upper airway -Mucinex DM 15.  Hyponatremia  Sodium 131 on 6/3  Continue to monitor 16.  Leukocytosis: Resolved  Continue to monitor  Afebrile  Continue to monitor 17. Aortic valve mass: discussed finding with patient and plan for outpatient follow-up/removal.  18. Cough: since current regimen not helping, cough is very bothersome to her, imaging is negative for infection, and dextromethorphan may be contributing to her fatigue, changed to Mucinex to 31mL TID guaifensin-codeine. Change to PRN 6/7 19. AKI: Resolved  Cr 0.80  on 6/3  Encourage fluids 20.  Hyperkalemia  Lokelma ordered x1. Discussed that K+ has normalized 6/4 21. Proteus UTI: bactrim and probiotic started 6/7  LOS: 21 days A FACE TO FACE EVALUATION WAS PERFORMED  [name] 05/20/2021, 12:32 PM

## 2021-05-21 LAB — GLUCOSE, CAPILLARY
Glucose-Capillary: 134 mg/dL — ABNORMAL HIGH (ref 70–99)
Glucose-Capillary: 118 mg/dL — ABNORMAL HIGH (ref 70–99)
Glucose-Capillary: 152 mg/dL — ABNORMAL HIGH (ref 70–99)
Glucose-Capillary: 87 mg/dL (ref 70–99)

## 2021-05-21 MED ORDER — AMLODIPINE BESYLATE 5 MG PO TABS
5.0000 mg | ORAL_TABLET | Freq: Every day | ORAL | Status: DC
  Administered 2021-05-21 – 2021-05-23 (×3): 5 mg via ORAL
  Filled 2021-05-21 (×3): qty 1

## 2021-05-21 NOTE — Progress Notes (Signed)
Speech Language Pathology Weekly Progress and Session Note  Patient Details  Name: [name] MRN: [id number] Date of Birth: September 23, 1956  Beginning of progress report period: May 14, 2021 End of progress report period: May 21, 2021  Today's Date: 05/21/2021 SLP Individual Time: 1400-1445 SLP Individual Time Calculation (min): 45 min  Short Term Goals: Week 3: SLP Short Term Goal 1 (Week 3): Pt will complete complex problem solving tasks with supervision A verbal cues. SLP Short Term Goal 1 - Progress (Week 3): Met SLP Short Term Goal 2 (Week 3): Pt will self-monitoring and self-correct functional errors in complex tasks with supervision A verbal cues, SLP Short Term Goal 2 - Progress (Week 3): Met SLP Short Term Goal 3 (Week 3): Pt will demonstrate anticipatory awareness skills in listing 3 activites that are safe verus unsafe due to acute deficits with supervision A verbal cues. SLP Short Term Goal 3 - Progress (Week 3): Met SLP Short Term Goal 4 (Week 3): Pt will demonstrate left attention (of left arm) during functional transfers (ex: turning) with min A verbal cues. SLP Short Term Goal 4 - Progress (Week 3): Progressing toward goal SLP Short Term Goal 5 (Week 3): Pt will demonstrate alternating attention in functionals tasks with supervision A verbal cues. SLP Short Term Goal 5 - Progress (Week 3): Met    New Short Term Goals: Week 4: SLP Short Term Goal 1 (Week 4): STG=LTG due to ELOS  Weekly Progress Updates: Pt has met 4/5 STG's this reporting period due to improvement in problem solving , alternating attention, anticipatory awareness, and awareness of errors. She continues to require min A for left attention during functional transfers and visual and verbal cues. Pt educated on cognitive tasks she can complete once home and given written handout for daughter to review. Pt will benefit from cont slp intervention during rehab stay to maximize independence.     Intensity:  Minumum of 1-2 x/day, 30 to 90 minutes Frequency: 3 to 5 out of 7 days Duration/Length of Stay: 6/14 Treatment/Interventions: Cognitive remediation/compensation;Cueing hierarchy;Functional tasks;Medication managment;Internal/external aids;Environmental controls;Patient/family education   Daily Session  Skilled Therapeutic Interventions: Skilled slp intervention focused on cognition. Pt very lethargic this session but participated well for most of session. Pt completed written complex problem solving task with supervision A for error awareness. Family not present for family education today. Pt given written handout with cognitive exercises she can complete once home and placed in binder. Alternating attention card sorting task completed with Mod I-supervision for attention to details and attention to cards in far left that she could place in discard pile. Pt very tired at end of session and requested to lay down. Pt missed 15 min due to lethergy. cont with therapy per plan of care.      General    Pain Pain Assessment Pain Scale: Faces Faces Pain Scale: No hurt  Therapy/Group: Individual Therapy  Darrol Poke Jewett Mcgann 05/21/2021, 2:50 PM

## 2021-05-21 NOTE — Progress Notes (Signed)
Occupational Therapy Session Note  Patient Details  Name: [name] MRN: [id number] Date of Birth: [birth year]  Today's Date: 05/21/2021 OT Individual Time: 4801-6553 OT Individual Time Calculation (min): 75 min    Short Term Goals: Week 2:  OT Short Term Goal 1 (Week 2): STGs = LTGs  Skilled Therapeutic Interventions/Progress Updates:    Pt received in bathroom using toilet with A from her Nurse techs. Pt transferred to wc via stedy that nursing was using.  Had pt practice sit to stands not using the walker with cues to place L hand on lap and ensure L foot in good placement. Pt able to do so with light CGA to close S.  Then had pt use RW to ambulate to arm chair to doff and don clothing.  Pt opted not to bathe today.  See ADL documentation below.   Her daughter arrived at 915am for the last 30 minutes. Pt taken to ADL apt to set up bathroom to simulate home environment. Demonstrated and then had pt practice 2 options of tub transfers using the tub bench. Her daughter believes she has a tub bench in the attic, if not they know where they can purchase one.  Discussed use of suction grab bar and where to buy one.  Pt will need an elevated toilet seat but a regular BSC will prevent her from lifting legs over seat to get into tub. Pt will need a drop arm BSC or a clamp on toilet seat.   Reviewed safe sit to stand and transfer techniques with dtr.  Had dtr practice ambulating with her mom in the hall way. Pt wanted to sit after 20 ft. Pt taken back to room and opted to lay down for a few minutes before PT session.    Daughter did not have any concerns or questions.      Therapy Documentation Precautions:  Precautions Precautions: Fall Precaution Comments: poor L side sensory awareness Restrictions Weight Bearing Restrictions: No    Pain: Pain Assessment Pain Scale: 0-10 Pain Score: 0-No pain ADL: ADL Eating: Independent Grooming: Modified independent Where Assessed-Grooming:  Sitting at sink Upper Body Bathing: Supervision/safety Where Assessed-Upper Body Bathing: Shower Lower Body Bathing: Contact guard Where Assessed-Lower Body Bathing: Shower Upper Body Dressing: Setup Where Assessed-Upper Body Dressing: Chair Lower Body Dressing: Contact guard Where Assessed-Lower Body Dressing: Chair Toileting: Minimal assistance Where Assessed-Toileting: Glass blower/designer: Therapist, music Method: Counselling psychologist: Energy manager: Metallurgist Method: Optometrist: Engineer, water: Curator Method: Heritage manager: Transfer tub bench,Grab bars   Therapy/Group: Individual Therapy  [place] 05/21/2021, 10:32 AM

## 2021-05-21 NOTE — Progress Notes (Signed)
[facility] PHYSICAL MEDICINE & REHABILITATION PROGRESS NOTE  Subjective/Complaints:  Asking about cardiac mass size , no other c/o  ROS: Denies CP, SOB, N/V/D, +left arm pain, cough improved  Objective: Vital Signs: Blood pressure (!) 163/73, pulse 80, temperature 97.9 F (36.6 C), resp. rate 18, SpO2 92 %. No results found. No results for input(s): WBC, HGB, HCT, PLT in the last 72 hours. Recent Labs    05/19/21 1033  NA 135  K 4.3  CL 97*  CO2 30  GLUCOSE 182*  BUN 10  CREATININE 0.96  CALCIUM 9.3    Intake/Output Summary (Last 24 hours) at 05/21/2021 1235 Last data filed at 05/21/2021 0841 Gross per 24 hour  Intake 800 ml  Output --  Net 800 ml        Physical Exam: BP (!) 163/73 (BP Location: Right Arm)   Pulse 80   Temp 97.9 F (36.6 C)   Resp 18   SpO2 92%   Gen: no distress, normal appearing HEENT: oral mucosa pink and moist, NCAT Cardio: Reg rate Chest: normal effort, normal rate of breathing Abd: soft, non-distended Ext: no edema Psych: pleasant, normal affect Skin: intact Neuro: Sensation reduce but is able to identify LT and proprioception   Follows commands.  Left inattention, some improvement Fair insight and awareness.  Motor:LUE 4-4+/5 prox to distal with mild apraxia, improving LLE: 4--4/5 prox to distal, improving  Assessment/Plan: 1. Functional deficits which require 3+ hours per day of interdisciplinary therapy in a comprehensive inpatient rehab setting.  Physiatrist is providing close team supervision and 24 hour management of active medical problems listed below.  Physiatrist and rehab team continue to assess barriers to discharge/monitor patient progress toward functional and medical goals   Care Tool:  Bathing    Body parts bathed by patient: Chest,Abdomen,Right upper leg,Left upper leg,Face,Left arm,Front perineal area,Buttocks,Right arm,Right lower leg,Left lower leg   Body parts bathed by helper: Right lower leg,Left  lower leg     Bathing assist Assist Level: Contact Guard/Touching assist     Upper Body Dressing/Undressing Upper body dressing   What is the patient wearing?: Pull over shirt,Bra    Upper body assist Assist Level: Set up assist    Lower Body Dressing/Undressing Lower body dressing      What is the patient wearing?: Pants     Lower body assist Assist for lower body dressing: Contact Guard/Touching assist     Toileting Toileting    Toileting assist Assist for toileting: Minimal Assistance - Patient > 75%     Transfers Chair/bed transfer  Transfers assist     Chair/bed transfer assist level: Contact Guard/Touching assist     Locomotion Ambulation   Ambulation assist      Assist level: Minimal Assistance - Patient > 75% Assistive device: Walker-rolling Max distance: 129ft   Walk 10 feet activity   Assist  Walk 10 feet activity did not occur: Safety/medical concerns  Assist level: Minimal Assistance - Patient > 75% Assistive device: Walker-rolling   Walk 50 feet activity   Assist Walk 50 feet with 2 turns activity did not occur: Safety/medical concerns  Assist level: Minimal Assistance - Patient > 75% Assistive device: Walker-rolling    Walk 150 feet activity   Assist Walk 150 feet activity did not occur: Safety/medical concerns  Assist level: Minimal Assistance - Patient > 75% Assistive device: Walker-rolling    Walk 10 feet on uneven surface  activity   Assist Walk 10 feet on uneven surfaces activity  did not occur: Safety/medical concerns         Wheelchair     Assist Will patient use wheelchair at discharge?: No   Wheelchair activity did not occur: N/A         Wheelchair 50 feet with 2 turns activity    Assist    Wheelchair 50 feet with 2 turns activity did not occur: N/A       Wheelchair 150 feet activity     Assist  Wheelchair 150 feet activity did not occur: N/A        Medical Problem List and  Plan: 1.Left side hemiparesis to right thalamic infarction. Status post mechanical thrombectomy of right PCA P2 occlusion 04/19/2021. Status post loop recorder  Continue CIR  WHO nightly 2. Antithrombotics: -DVT/anticoagulation:SCDs  LLE doppler negative for DVT. Discussed with patient and she is reassured by this -antiplatelet therapy: Aspirin 81 mg daily, Plavix 75 mg daily until cardiac surgery  3. Left arm/hand pain: Discussed could be from tendonitis-overuse of flexors in this arm/hand. Recommended to start with icing 15mg  TID and will proceed to voltaren gel if needed. She is agreeable.  Tylenol as needed  Robaxin as needed started on 5/20, conitnue  Continue low dose gabapentin 100mg  qhs  Controlled on 6/7 4. Mood:Zoloft 100 mg daily, melatonin as needed. Provide emotional support -antipsychotic agents: N/A 5. Neuropsych: This patientiscapable of making decisions on herown behalf.  Discussed with Neuropsych - appreciate eval 6. Skin/Wound Care:Routine skin checks 7. Fluids/Electrolytes/Nutrition:Routine in and outs 8. Diabetes mellitus with hyperglycemia. Hemoglobin A1c 9.3.  Patient on Actos, Januvia 100 mg, Glucotrol XL5 mg daily,metformin 1000 mg twice daily prior to admission.   Currently onNovoLog 3units 3 times daily with meals  Glucophage at 500mg  bid, increased to 1000 BID on 5/20  Diabetic teaching  Levemir to 11 units BID on 5/30, increased to 12 BID on 6/2 CBG (last 3)  Recent Labs    05/20/21 2124 05/21/21 0609 05/21/21 1145  GLUCAP 96 134* 118*  well controlled 6/8  9. Hypertension. Cozaar 25 mg twice daily, increased to 50 on 5/24, changed to 75 BID on 5/27, decreased to 50 BID on 5/31  Norvasc 2.5 started on 6/1, changed to qhs on 6/3  Hydralizine 10 qhs started on 5/31, increased on 6/1 Vitals:   05/20/21 2057 05/21/21 0611  BP: (!) 167/71 (!) 163/73  Pulse: 86 80  Resp: 18 18  Temp: 98.4 F (36.9 C) 97.9  F (36.6 C)  SpO2: 96% 92%  will increase norvasc to 5mg , on 6/8 10. Hyperlipidemia. Lipitor 11. Obesity. BMI 33.19. Dietary follow-up 12. GERD. Protonix 13. Constipation: scheduled senna-s, encourage liquids, fruits, vegetables -dulcolax suppository/fleet enema prn  Adjust bowel meds as necessary 14. Cough: COVID negative -appears to be mostly pharyngeal, upper airway -Mucinex DM 15.  Hyponatremia  Sodium 131 on 6/3  Continue to monitor 16.  Leukocytosis: Resolved  Continue to monitor  Afebrile  Continue to monitor 17. Aortic valve mass: discussed finding with patient and plan for outpatient follow-up/removal.  18. Cough: since current regimen not helping, cough is very bothersome to her, imaging is negative for infection, and dextromethorphan may be contributing to her fatigue, changed to Mucinex to 23mL TID guaifensin-codeine. Change to PRN 6/7 19. AKI: Resolved  Cr 0.80 on 6/3  Encourage fluids 20.  Hyperkalemia  Lokelma ordered x1. Discussed that K+ has normalized 6/4 21. Proteus UTI: bactrim and probiotic started 6/7- afeb 6/8  LOS: 22 days A FACE TO FACE EVALUATION  WAS PERFORMED  Charlett Blake 05/21/2021, 12:35 PM

## 2021-05-21 NOTE — Progress Notes (Signed)
Occupational Therapy Weekly Progress Note  Patient Details  Name: [name] MRN: [id number] Date of Birth: 1956-01-20  Beginning of progress report period: May 13, 2021 End of progress report period: May 21, 2021     Patient Is continuing to work towards her upgraded LTGs.  She will be leaving next week.   Patient continues to demonstrate the following deficits: unbalanced muscle activation, decreased coordination and decreased sensation/ proprioception, decreased attention to left and decreased standing balance, decreased postural control and hemiplegia and therefore will continue to benefit from skilled OT intervention to enhance overall performance with BADL and iADL.  Patient progressing toward long term goals..  Continue plan of care.  OT Short Term Goals Week 1:  OT Short Term Goal 1 (Week 1): pt will complete squat pivot transfers with mod A. OT Short Term Goal 1 - Progress (Week 1): Met OT Short Term Goal 2 (Week 1): Pt will be able to hold static stand with min A. OT Short Term Goal 2 - Progress (Week 1): Met OT Short Term Goal 3 (Week 1): Pt will don shirt with min A OT Short Term Goal 3 - Progress (Week 1): Met OT Short Term Goal 4 (Week 1): Pt will be able to don pants over feet with min A. OT Short Term Goal 4 - Progress (Week 1): Met OT Short Term Goal 5 (Week 1): Pt will be able to pull pants over hips with mod A OT Short Term Goal 5 - Progress (Week 1): Met Week 2:  OT Short Term Goal 1 (Week 2): STGs = LTGs OT Short Term Goal 1 - Progress (Week 2): Progressing toward goal Week 3:  OT Short Term Goal 1 (Week 3): continuation of LTGs as pt's LOS extended.   Therapy Documentation Precautions:  Precautions Precautions: Fall Precaution Comments: poor L side sensory awareness Restrictions Weight Bearing Restrictions: No  Pain: Pain Assessment Pain Scale: 0-10 Pain Score: 0-No pain ADL: ADL Eating: Independent Grooming: Modified independent Where  Assessed-Grooming: Sitting at sink Upper Body Bathing: Supervision/safety Where Assessed-Upper Body Bathing: Shower Lower Body Bathing: Contact guard Where Assessed-Lower Body Bathing: Shower Upper Body Dressing: Setup Where Assessed-Upper Body Dressing: Chair Lower Body Dressing: Contact guard Where Assessed-Lower Body Dressing: Chair Toileting: Minimal assistance Where Assessed-Toileting: Glass blower/designer: Therapist, music Method: Counselling psychologist: Energy manager: Metallurgist Method: Optometrist: Engineer, water: Curator Method: Heritage manager: Transfer tub bench,Grab bars  Galliano 05/21/2021, 10:33 AM

## 2021-05-21 NOTE — Progress Notes (Signed)
Physical Therapy Session Note  Patient Details  Name: [name] MRN: [id number] Date of Birth: [birth year]  Today's Date: 05/21/2021 PT Individual Time: 1000-1056 PT Individual Time Calculation (min): 56 min   Short Term Goals: Week 3:  PT Short Term Goal 1 (Week 3): Pt will complete bed<>chair transfers consistently with CGA and LRAD PT Short Term Goal 2 (Week 3): Pt will ambulate 142ft with minA and LRAD PT Short Term Goal 3 (Week 3): Pt will demonstrate improved L attention into functional mobility tasks  Skilled Therapeutic Interventions/Progress Updates:    Pt received supine in bed with daughter at bedside - focus of session to complete scheduled family ed/training. CSW arriving to discuss DME needs - informed pt of financial costs for w/c and pt ultimately deciding to defer w/c and a transport chair. Educated her on the benefits of having a w/c to improve safety and mobilty in the home but she continued to defer. Suggested family to look in thrift stores or [employer] to obtain a w/c if possible. Also discussed with daughter home safety training, role of f/u therapies, stroke recovery, PT goals and pt's mobility level, and use of emergency services in the event of a fall. Bed mobility completed with supervision without bed features. Squat<>pivot transfer towards stronger R side with CGA from EOB although cues needed for setup and sequencing. Pt wheeled in w/c for time management for remainder of session. Completed car transfer via ambulatory transfer with minA and RW - car height set to simulate that of her Marja Kays - educated on safety awareness and technique to reduce falls risk. Next, worked on IT trainer where she negotiated up/down x4 steps with minA and `1 hand rail with therapist assist and then additional 4 steps with daughter providing minA and PT providing CGA for safety. Next, worked on short distance gait with RW and educated daughter on guarding technique and LLE weakness  impacting her gait. All questions and concerns addressed from pt and daughter - reinforced importance of 24/7 care from an ADULT post DC. Pt ended session supine in bed at end of session with needs in reach and bed alarm on. Discussed in private with daughter regarding expectations for mobility moving forward, prognosis in terms of stroke recovery and level of indep, etc. Daughter appreciative of education/feedback. A 2nd family ed/training is scheduled over the weekend with her son.   Therapy Documentation Precautions:  Precautions Precautions: Fall Precaution Comments: poor L side sensory awareness Restrictions Weight Bearing Restrictions: No General:    Therapy/Group: Individual Therapy  [name] 05/21/2021, 7:43 AM

## 2021-05-21 NOTE — Progress Notes (Signed)
Patient ID: [name], female   DOB: 07-24-1956, 65 y.o.   MRN: [id number]  Daughter is here for therapy sessions. Discussed cost of wheelchair and both have decided to get one on their own or to purchase a transport chair. Continue to work on discharge needs.

## 2021-05-22 LAB — GLUCOSE, CAPILLARY
Glucose-Capillary: 150 mg/dL — ABNORMAL HIGH (ref 70–99)
Glucose-Capillary: 103 mg/dL — ABNORMAL HIGH (ref 70–99)
Glucose-Capillary: 143 mg/dL — ABNORMAL HIGH (ref 70–99)
Glucose-Capillary: 129 mg/dL — ABNORMAL HIGH (ref 70–99)

## 2021-05-22 MED ORDER — DICLOFENAC SODIUM 1 % EX GEL
2.0000 g | Freq: Four times a day (QID) | CUTANEOUS | Status: DC | PRN
  Administered 2021-05-22 – 2021-05-27 (×12): 2 g via TOPICAL
  Filled 2021-05-22: qty 100

## 2021-05-22 NOTE — Progress Notes (Signed)
Speech Language Pathology Daily Session Note  Patient Details  Name: [name] MRN: [id number] Date of Birth: 27-Jun-1956  Today's Date: 05/22/2021 SLP Individual Time: 1430-1500 SLP Individual Time Calculation (min): 30 min  Short Term Goals: Week 4: SLP Short Term Goal 1 (Week 4): STG=LTG due to ELOS  Skilled Therapeutic Interventions:Skilled SLP intervention focused on cognition. Pt completed higher level deductive reasoning puzzle with mod I once familiar with task. She recalled medications and purpose with supervision A and required min A for time taken. Pt reported she continues to require occasional cues for PT/OT for attention to left arm with transfers but reported she verbalizes aloud steps with transfers to increase attention to left arm. Pts cognition appears to be at or very close to baseline. Cont with therapy per plan of care.      Pain Pain Assessment Pain Scale: Faces Faces Pain Scale: No hurt  Therapy/Group: Individual Therapy  [name] [name] 05/22/2021, 2:56 PM

## 2021-05-22 NOTE — Progress Notes (Signed)
Occupational Therapy Session Note  Patient Details  Name: [name] MRN: [id number] Date of Birth: Apr 25, 1956  Today's Date: 05/22/2021 OT Individual Time: 0900-1000 OT Individual Time Calculation (min): 60 min    Short Term Goals:  Week 3:  OT Short Term Goal 1 (Week 3): continuation of LTGs as pt's LOS extended.  Skilled Therapeutic Interventions/Progress Updates:    Pt supine in bed, asleep, easily aroused and agreeable to OT session. Pt requesting to toilet, shower, and change clothes. Educated pt on safe functional transfer technique of squat pivot rather than stand pivot for toilet transfer using bedside commode due to she will not have assist during the day with toileting because her daughter works.  Pt return demonstrated EOB<>BSC needing CGA and Vcs to refrain from using LUE to steady for balance due to poor motor control and proprioception.  Pt had continent episode of bowel and bladder.  Clothing mgt and pericare completed seated with forward and lateral leans however needing min assist to donn/doff shorts and underwear and CGA for balance due to pt still trying to use LUE unsafely for balance.  Pt ambulated using RW EOB to shower bench at walk in shower with close supervision.  Pt dressed and bathed UB with supervision seated.  Bathed LB with CGA using long handled sponge.  Dressed LB including underwear and pants with min assist.  Pt ambulated shower bench to EOB with close supervision using RW.  Sit to supine with supervision.  Call bell in reach, bed alarm on.   Therapy Documentation Precautions:  Precautions Precautions: Fall Precaution Comments: poor L side sensory awareness Restrictions Weight Bearing Restrictions: No   Therapy/Group: Individual Therapy  Ezekiel Slocumb 05/22/2021, 11:38 AM

## 2021-05-22 NOTE — Progress Notes (Signed)
[facility] PHYSICAL MEDICINE & REHABILITATION PROGRESS NOTE  Subjective/Complaints: Continues to complain of pain in left palm- discussed likely flexor tendinosis- rest hand when she can and avoid squeezing. Discussed with patient, therapy and RN icing three times per day 15 minutes, voltaren gel ordered PRN Urinary symptoms improved  ROS: Denies CP, SOB, N/V/D, +left arm and hand pain, cough improved  Objective: Vital Signs: Blood pressure (!) 169/73, pulse 81, temperature 97.7 F (36.5 C), resp. rate 18, SpO2 94 %. No results found. No results for input(s): WBC, HGB, HCT, PLT in the last 72 hours. Recent Labs    05/19/21 1033  NA 135  K 4.3  CL 97*  CO2 30  GLUCOSE 182*  BUN 10  CREATININE 0.96  CALCIUM 9.3    Intake/Output Summary (Last 24 hours) at 05/22/2021 0940 Last data filed at 05/22/2021 5993 Gross per 24 hour  Intake 720 ml  Output --  Net 720 ml        Physical Exam: BP (!) 169/73 (BP Location: Right Arm)   Pulse 81   Temp 97.7 F (36.5 C)   Resp 18   SpO2 94%   Gen: no distress, normal appearing HEENT: oral mucosa pink and moist, NCAT Cardio: Reg rate Chest: normal effort, normal rate of breathing Abd: soft, non-distended Ext: no edema Psych: pleasant, normal affect Skin: intact Neuro: Sensation reduce but is able to identify LT and proprioception   Follows commands.  Left inattention, some improvement Fair insight and awareness.  Motor: LUE 4-4+/5 prox to distal with mild apraxia, improving LLE: 4--4/5 prox to distal, improving  Assessment/Plan: 1. Functional deficits which require 3+ hours per day of interdisciplinary therapy in a comprehensive inpatient rehab setting. Physiatrist is providing close team supervision and 24 hour management of active medical problems listed below. Physiatrist and rehab team continue to assess barriers to discharge/monitor patient progress toward functional and medical goals   Care Tool:  Bathing    Body  parts bathed by patient: Chest, Abdomen, Right upper leg, Left upper leg, Face, Left arm, Front perineal area, Buttocks, Right arm, Right lower leg, Left lower leg   Body parts bathed by helper: Right lower leg, Left lower leg     Bathing assist Assist Level: Contact Guard/Touching assist     Upper Body Dressing/Undressing Upper body dressing   What is the patient wearing?: Pull over shirt, Bra    Upper body assist Assist Level: Set up assist    Lower Body Dressing/Undressing Lower body dressing      What is the patient wearing?: Pants     Lower body assist Assist for lower body dressing: Contact Guard/Touching assist     Toileting Toileting    Toileting assist Assist for toileting: Minimal Assistance - Patient > 75%     Transfers Chair/bed transfer  Transfers assist     Chair/bed transfer assist level: Contact Guard/Touching assist     Locomotion Ambulation   Ambulation assist      Assist level: Minimal Assistance - Patient > 75% Assistive device: Walker-rolling Max distance: 178ft   Walk 10 feet activity   Assist  Walk 10 feet activity did not occur: Safety/medical concerns  Assist level: Minimal Assistance - Patient > 75% Assistive device: Walker-rolling   Walk 50 feet activity   Assist Walk 50 feet with 2 turns activity did not occur: Safety/medical concerns  Assist level: Minimal Assistance - Patient > 75% Assistive device: Walker-rolling    Walk 150 feet activity  Assist Walk 150 feet activity did not occur: Safety/medical concerns  Assist level: Minimal Assistance - Patient > 75% Assistive device: Walker-rolling    Walk 10 feet on uneven surface  activity   Assist Walk 10 feet on uneven surfaces activity did not occur: Safety/medical concerns         Wheelchair     Assist Will patient use wheelchair at discharge?: No   Wheelchair activity did not occur: N/A         Wheelchair 50 feet with 2 turns  activity    Assist    Wheelchair 50 feet with 2 turns activity did not occur: N/A       Wheelchair 150 feet activity     Assist  Wheelchair 150 feet activity did not occur: N/A        Medical Problem List and Plan: 1.  Left side hemiparesis to right thalamic infarction.  Status post mechanical thrombectomy of right PCA P2 occlusion 04/19/2021.  Status post loop recorder  Continue CIR  WHO nightly 2.  Antithrombotics: -DVT/anticoagulation: SCDs  LLE doppler negative for DVT. Discussed with patient and she is reassured by this             -antiplatelet therapy: Aspirin 81 mg daily, Plavix 75 mg daily until cardiac surgery  3. Left arm/hand pain: Discussed could be from tendinosis-overuse of flexors in this arm/hand. Recommended to start with icing 15mg  TID and will proceed to voltaren gel if needed. She is agreeable. Discussed with therapy and nursing. Rest hand and arm between therapy sessions.   Tylenol as needed  Robaxin as needed started on 5/20, conitnue  Continue low dose gabapentin 100mg  qhs 4. Mood: Zoloft 100 mg daily, melatonin as needed.  Provide emotional support             -antipsychotic agents: N/A 5. Neuropsych: This patient is capable of making decisions on her own behalf.  Discussed with Neuropsych - appreciate eval 6. Skin/Wound Care: Routine skin checks 7. Fluids/Electrolytes/Nutrition: Routine in and outs 8.  Diabetes mellitus with hyperglycemia.  Hemoglobin A1c 9.3.    Patient on Actos, Januvia 100 mg, Glucotrol XL 5 mg daily, metformin 1000 mg twice daily prior to admission.    Currently on NovoLog 3 units 3 times daily with meals  Glucophage at 500mg  bid, increased to 1000 BID on 5/20  Diabetic teaching  Levemir to 11 units BID on 5/30, increased to 12 BID on 6/2 CBG (last 3)  Recent Labs    05/21/21 1702 05/21/21 2102 05/22/21 0618  GLUCAP 152* 87 150*  well controlled 6/9, continue current regimen.   9. Hypertension.  Cozaar 25 mg twice  daily, increased to 50 on 5/24, changed to 75 BID on 5/27, decreased to 50 BID on 5/31  Norvasc 2.5 started on 6/1, changed to qhs on 6/3  Hydralizine 10 qhs started on 5/31, increased on 6/1 Vitals:   05/21/21 2008 05/22/21 0617  BP: (!) 159/76 (!) 169/73  Pulse: 83 81  Resp: 18 18  Temp: 98.6 F (37 C) 97.7 F (36.5 C)  SpO2: 98% 94%  will increase norvasc to 5mg , on 6/8, continue to monitor for effects of yesterday's change. 10.  Hyperlipidemia.  Lipitor 11.  Obesity.  BMI 33.19.  Dietary follow-up 12.  GERD.  Protonix 13. Constipation: scheduled senna-s, encourage liquids, fruits, vegetables             -dulcolax suppository/fleet enema prn  Adjust bowel meds as necessary 14. Cough: COVID  negative             -appears to be mostly pharyngeal, upper airway             -Mucinex DM 15.  Hyponatremia  Sodium 131 on 6/3  Continue to monitor 16.  Leukocytosis: Resolved  Continue to monitor  Afebrile  Continue to monitor 17. Aortic valve mass: discussed finding with patient and plan for outpatient follow-up/removal.  18. Cough: since current regimen not helping, cough is very bothersome to her, imaging is negative for infection, and dextromethorphan may be contributing to her fatigue, changed to Mucinex to 73mL TID guaifensin-codeine. Change to PRN 6/7 19. AKI: Resolved  Cr 0.80 on 6/3  Encourage fluids 20.  Hyperkalemia  Lokelma ordered x1. Discussed that K+ has normalized 6/4 21. Proteus UTI: bactrim and probiotic started 6/7- afeb 6/8  LOS: 23 days A FACE TO FACE EVALUATION WAS PERFORMED  Blakelyn Dinges P Dowell Hoon 05/22/2021, 9:40 AM

## 2021-05-22 NOTE — Progress Notes (Signed)
Occupational Therapy Session Note  Patient Details  Name: [name] MRN: [id number] Date of Birth: [birth year]  Today's Date: 05/22/2021 OT Individual Time: 9872-1587 OT Individual Time Calculation (min): 47 min    Short Term Goals: Week 3:  OT Short Term Goal 1 (Week 3): continuation of LTGs as pt's LOS extended.  Skilled Therapeutic Interventions/Progress Updates:    Pt received in bed and consented to OT tx. Pt SPT from EOB to w/c with CGA, pt requested to perform grooming tasks and oral hygiene sink side. Pt req min A to put hair into ponytail, but was able to take out old pony tail and brush her hair with SUP. Oral care completed with setup. Pt requested to focus therapy session on LUE strengthening and coordination. Box and blacks test performed, pt scored 43 blocks in 1 minute on R hand, 16 blocks on L hand in 1 minute. Pt then instructed in 1# db HEP (with ace wrap to hold dumbbell in place) utilized mirror for visual feedback and trained in elbow flexion, shoulder press, shoulder flexion and chest press all for 2x15. Pt req cuing to slow down movements for improved form during exercises with fair carryover. After session, pt helped back to bed and left with alarm on and all needs met.   Therapy Documentation Precautions:  Precautions Precautions: Fall Precaution Comments: poor L side sensory awareness Restrictions Weight Bearing Restrictions: No   Pain: Pain Assessment Pain Scale: Faces Faces Pain Scale: No hurt    Therapy/Group: Individual Therapy  Charmika Macdonnell 05/22/2021, 5:29 PM

## 2021-05-22 NOTE — Progress Notes (Signed)
Physical Therapy Weekly Progress Note  Patient Details  Name: [name] MRN: [id number] Date of Birth: 27-Sep-1956  Beginning of progress report period: May 15, 2021 End of progress report period: May 22, 2021  Today's Date: 05/22/2021 PT Individual Time: 1300-1411 PT Individual Time Calculation (min): 71 min   Patient has met 3 of 3 short term goals.  Pt making appropriate progress towards goals. She is able to complete bed mobility with supervision (cues needed for LUE awareness), sit<>stand transfers with CGA and RW, and can ambulate ~172f with minA and RW. She is able to navigate up/down 8 steps with minA and 1 hand rail support as well. Continues to be primarily limited by L inattention, L hemibody weakness, dynamic standing balance, gait impairments, and decreased safety awareness.   Patient continues to demonstrate the following deficits muscle weakness, decreased cardiorespiratoy endurance, impaired timing and sequencing, unbalanced muscle activation, and decreased motor planning, decreased attention, decreased awareness, decreased problem solving, and decreased safety awareness, and decreased standing balance, decreased postural control, hemiplegia, and decreased balance strategies and therefore will continue to benefit from skilled PT intervention to increase functional independence with mobility.  Patient progressing toward long term goals..  Continue plan of care.  PT Short Term Goals Week 3:  PT Short Term Goal 1 (Week 3): Pt will complete bed<>chair transfers consistently with CGA and LRAD PT Short Term Goal 1 - Progress (Week 3): Met PT Short Term Goal 2 (Week 3): Pt will ambulate 1031fwith minA and LRAD PT Short Term Goal 2 - Progress (Week 3): Met PT Short Term Goal 3 (Week 3): Pt will demonstrate improved L attention into functional mobility tasks PT Short Term Goal 3 - Progress (Week 3): Met Week 4:  PT Short Term Goal 1 (Week 4): STG = LTG due to ELOS  Skilled  Therapeutic Interventions/Progress Updates:     Pt received supine in bed and agreeable to PT tx. No reports of pain but reports urgency to use toilet. Bed mobility completed supervision with bed features. Sit<>Stand with CGA to RW and ambulated from EOB to 3-1 BSC over toilet, ~1048fwith CGA/minA and RW. Cues needed during gait for safety awareness and increasing L foot clearance as she was tending to drag it. Pt required minA for standing balance as she lowered pants. She was continent of bladder only (reported she felt urge to have BM), charted in flowsheets. Ambulated back to her w/c with minA and RW within her room, again needing cues for safety awareness with RW management and safety approach to sitting surfaces. Wheeled with totalA to day room rehab gym. Completed ambulatory transfer with minA and RW where she sat on the Kinetron. Completed 2x5 minutes of Kinetron with workload of ~25cm/sec resistance - working on reciprocal stepping patterns to carryover into functional gait. Ambulatory transfer with minA and RW off of the Kinetron back to her w/c. Next, worked on staIT trainerere she negotiated up/down 2x4 steps with modA (typically requires minA) and R hand rail support - she continues to require cues for stepping pattern and significant cueing for PACED activity due to some impulsivity with functional mobility this session. She then completed functional gait training where she ambulated 2x125f50fth minA and RW - cues for paced activity, increasing L step length, keeping body within walker frame, and safety awareness. Completed furniture transfer from low sitting sofa couch where she required minA for powering to rise to her RW. She was returned to her room in w/c  with totalA and assisted back to bed via stand<>pivot with CGA and use of bedrail. Bed mobility completed with supervision and bed alarm on. All needs in reach at end of session.  Therapy Documentation Precautions:   Precautions Precautions: Fall Precaution Comments: poor L side sensory awareness Restrictions Weight Bearing Restrictions: No General:    Therapy/Group: Individual Therapy  Calum Cormier P Alin Chavira 05/22/2021, 7:59 AM

## 2021-05-23 LAB — GLUCOSE, CAPILLARY
Glucose-Capillary: 122 mg/dL — ABNORMAL HIGH (ref 70–99)
Glucose-Capillary: 87 mg/dL (ref 70–99)
Glucose-Capillary: 105 mg/dL — ABNORMAL HIGH (ref 70–99)
Glucose-Capillary: 98 mg/dL (ref 70–99)

## 2021-05-23 NOTE — Progress Notes (Signed)
Occupational Therapy Session Note  Patient Details  Name: [name] MRN: [id number] Date of Birth: 1956/12/01  Today's Date: 05/23/2021 OT Individual Time: 1300-1400 OT Individual Time Calculation (min): 60 min    Short Term Goals:  Week 3:  OT Short Term Goal 1 (Week 3): continuation of LTGs as pt's LOS extended.  Skilled Therapeutic Interventions/Progress Updates:    Pt in bed, dtr present, reports pain in left hand and requesting voltaren gel; nurse made aware.  Dtr agreeable to participate in assisting pt during session with bathing, toileting, and dressing, with encouragement by therapist.  Intermittently pt's dtr needing Vcs and visual cues to provide appropriate level of assist.  Pt ambulated EOB to toilet and completed toilet transfer and toileting with CGA at RW.  Pt ambulated a few feet and pivoted to shower bench with CGA as well.  Pt completed UB dressing with setup and LB dressing with min assist to pull underwear and pants over LLE and hips.  Pt bathed UB with setup and LB with CGA with one posterior LOB noted when standing to wash buttocks. Dtr did not guard pt and noted pt returning to seated position shower bench without control.  Emphasized to dtr the importance of providing close CGA during standing in shower tasks.  Pt also had a LOB posteriorly when standing at shower bench to pull pants over hips however pts dtr provided necessary CGA to achieve recovery in standing.  Pt ambulated to EOB and stand to sit with CGA.  Pts dtr left room and further blocked practicing of stand pivot EOB<>BSC completed with therapist.  Pt required CGA x 3 trials, then close supervision for another 3 trials using RW.  Pt returned to supine in bed, Call bell in reach, bed alarm on at end of session.  Therapy Documentation Precautions:  Precautions Precautions: Fall Precaution Comments: poor L side sensory awareness Restrictions Weight Bearing Restrictions: No   Therapy/Group: Individual  Therapy  Ezekiel Slocumb 05/23/2021, 4:14 PM

## 2021-05-23 NOTE — Progress Notes (Signed)
[facility] PHYSICAL MEDICINE & REHABILITATION PROGRESS NOTE  Subjective/Complaints:  Urinary symptoms improved  ROS: Denies CP, SOB, N/V/D, +left arm and hand pain, cough improved  Objective: Vital Signs: Blood pressure (!) 161/89, pulse 86, temperature 97.7 F (36.5 C), temperature source Oral, resp. rate 18, SpO2 98 %. No results found. No results for input(s): WBC, HGB, HCT, PLT in the last 72 hours. No results for input(s): NA, K, CL, CO2, GLUCOSE, BUN, CREATININE, CALCIUM in the last 72 hours.   Intake/Output Summary (Last 24 hours) at 05/23/2021 0731 Last data filed at 05/22/2021 1840 Gross per 24 hour  Intake 960 ml  Output --  Net 960 ml         Physical Exam: BP (!) 161/89 (BP Location: Right Arm)   Pulse 86   Temp 97.7 F (36.5 C) (Oral)   Resp 18   SpO2 98%    General: No acute distress Mood and affect are appropriate Heart: Regular rate and rhythm no rubs murmurs or extra sounds Lungs: Clear to auscultation, breathing unlabored, no rales or wheezes Abdomen: Positive bowel sounds, soft nontender to palpation, nondistended Extremities: No clubbing, cyanosis, or edema Skin: No evidence of breakdown, no evidence of rash Follows commands.  Left inattention, some improvement Fair insight and awareness.  Motor: LUE 4-4+/5 prox to distal with mild apraxia, improving LLE: 4--4/5 prox to distal, improving  Assessment/Plan: 1. Functional deficits which require 3+ hours per day of interdisciplinary therapy in a comprehensive inpatient rehab setting. Physiatrist is providing close team supervision and 24 hour management of active medical problems listed below. Physiatrist and rehab team continue to assess barriers to discharge/monitor patient progress toward functional and medical goals   Care Tool:  Bathing    Body parts bathed by patient: Chest, Abdomen, Right upper leg, Left upper leg, Face, Left arm, Front perineal area, Buttocks, Right arm, Right lower  leg, Left lower leg   Body parts bathed by helper: Right lower leg, Left lower leg     Bathing assist Assist Level: Contact Guard/Touching assist     Upper Body Dressing/Undressing Upper body dressing   What is the patient wearing?: Pull over shirt, Bra    Upper body assist Assist Level: Set up assist    Lower Body Dressing/Undressing Lower body dressing      What is the patient wearing?: Pants     Lower body assist Assist for lower body dressing: Contact Guard/Touching assist     Toileting Toileting    Toileting assist Assist for toileting: Minimal Assistance - Patient > 75%     Transfers Chair/bed transfer  Transfers assist     Chair/bed transfer assist level: Contact Guard/Touching assist     Locomotion Ambulation   Ambulation assist      Assist level: Minimal Assistance - Patient > 75% Assistive device: Walker-rolling Max distance: 161ft   Walk 10 feet activity   Assist  Walk 10 feet activity did not occur: Safety/medical concerns  Assist level: Minimal Assistance - Patient > 75% Assistive device: Walker-rolling   Walk 50 feet activity   Assist Walk 50 feet with 2 turns activity did not occur: Safety/medical concerns  Assist level: Minimal Assistance - Patient > 75% Assistive device: Walker-rolling    Walk 150 feet activity   Assist Walk 150 feet activity did not occur: Safety/medical concerns  Assist level: Minimal Assistance - Patient > 75% Assistive device: Walker-rolling    Walk 10 feet on uneven surface  activity   Assist Walk 10  feet on uneven surfaces activity did not occur: Safety/medical concerns         Wheelchair     Assist Will patient use wheelchair at discharge?: No   Wheelchair activity did not occur: N/A         Wheelchair 50 feet with 2 turns activity    Assist    Wheelchair 50 feet with 2 turns activity did not occur: N/A       Wheelchair 150 feet activity     Assist  Wheelchair  150 feet activity did not occur: N/A        Medical Problem List and Plan: 1.  Left side hemiparesis to right thalamic infarction.  Status post mechanical thrombectomy of right PCA P2 occlusion 04/19/2021.  Status post loop recorder  Continue CIR PT, OT,   WHO nightly 2.  Antithrombotics: -DVT/anticoagulation: SCDs  LLE doppler negative for DVT. Discussed with patient and she is reassured by this             -antiplatelet therapy: Aspirin 81 mg daily, Plavix 75 mg daily until cardiac surgery  3. Left arm/hand pain: Discussed could be from tendinosis-overuse of flexors in this arm/hand. Recommended to start with icing 15mg  TID and will proceed to voltaren gel if needed. She is agreeable. Discussed with therapy and nursing. Rest hand and arm between therapy sessions.   Tylenol as needed  Robaxin as needed started on 5/20, conitnue  Continue low dose gabapentin 100mg  qhs 4. Mood: Zoloft 100 mg daily, melatonin as needed.  Provide emotional support             -antipsychotic agents: N/A 5. Neuropsych: This patient is capable of making decisions on her own behalf.  Discussed with Neuropsych - appreciate eval 6. Skin/Wound Care: Routine skin checks 7. Fluids/Electrolytes/Nutrition: Routine in and outs 8.  Diabetes mellitus with hyperglycemia.  Hemoglobin A1c 9.3.    Patient on Actos, Januvia 100 mg, Glucotrol XL 5 mg daily, metformin 1000 mg twice daily prior to admission.    Currently on NovoLog 3 units 3 times daily with meals  Glucophage at 500mg  bid, increased to 1000 BID on 5/20  Diabetic teaching  Levemir to 11 units BID on 5/30, increased to 12 BID on 6/2 CBG (last 3)  Recent Labs    05/22/21 1712 05/22/21 2106 05/23/21 0601  GLUCAP 143* 129* 122*   well controlled 6/10  9. Hypertension.  Cozaar 25 mg twice daily, increased to 50 on 5/24, changed to 75 BID on 5/27, decreased to 50 BID on 5/31  Norvasc 2.5 started on 6/1, changed to qhs on 6/3  Hydralizine 10 qhs started on  5/31, increased on 6/1 Vitals:   05/22/21 1952 05/23/21 0604  BP: (!) 154/73 (!) 161/89  Pulse: 76 86  Resp: 18 18  Temp: 98.3 F (36.8 C) 97.7 F (36.5 C)  SpO2: 98% 98%  will increase norvasc to 5mg , on 6/8, continue to monitor for effects , may need further adjustment in 1-2 days if no improvement 10.  Hyperlipidemia.  Lipitor 11.  Obesity.  BMI 33.19.  Dietary follow-up 12.  GERD.  Protonix 13. Constipation: scheduled senna-s, encourage liquids, fruits, vegetables             -dulcolax suppository/fleet enema prn  Adjust bowel meds as necessary 14. Cough: COVID negative             -appears to be mostly pharyngeal, upper airway             -  Mucinex DM 15.  Hyponatremia  Sodium 131 on 6/3  Continue to monitor 16.  Leukocytosis: Resolved  Continue to monitor  Afebrile  Continue to monitor 17. Aortic valve mass: discussed finding with patient and plan for outpatient follow-up/removal.  18. Cough: since current regimen not helping, cough is very bothersome to her, imaging is negative for infection, and dextromethorphan may be contributing to her fatigue, changed to Mucinex to 25mL TID guaifensin-codeine. Change to PRN 6/7 19. AKI: Resolved  Cr 0.80 on 6/3  Encourage fluids 20.  Hyperkalemia  Lokelma ordered x1. Discussed that K+ has normalized 6/4 21. Proteus UTI: bactrim and probiotic started 6/7- afeb 6/8  LOS: 24 days A FACE TO [place] E Sheri Gatchel 05/23/2021, 7:31 AM

## 2021-05-23 NOTE — Progress Notes (Signed)
Physical Therapy Session Note  Patient Details  Name: [name] MRN: [id number] Date of Birth: [birth year]  Today's Date: 05/23/2021 PT Individual Time: 1000-1059 + 8299-3716 PT Individual Time Calculation (min): 59 min  + 27 min  Short Term Goals: Week 4:  PT Short Term Goal 1 (Week 4): STG = LTG due to ELOS   Skilled Therapeutic Interventions/Progress Updates:     1st session: Pt greeted supine in bed to start PT tx. No reports of pain. Supine<>sit completed with supervision and use of bed features. Donned slip-on shoes with totalA for time management. Pt with questions regarding w/c size for a transport chair as her son is looking to buy one online for her - informed her she would need at least a 20inch wide chair to allow adequate hip width. Completed stand<>pivot transfer with minA towards paretic L side - cues needed for L stepping pattern and general safety awareness - educated pt to try to bias transfers towards her R side at home to reduce falls risk and she voiced understanding. Wheeled with totalA in w/c to ortho rehab gym where she completed stand<>pivot with minA to Nustep. Instructed on 10 min of reciprocal stepping with BLE's only, workload set to 5. Pt needing intermittent rest breaks during this 10 min span. CGA required for stand<>pivot transfer off of Nustep to w/c where she was then wheeled to Naomi system to work on LUE coordination Floyd while saeted in w/c. Completed rotator CCW/CW with 3 speed and 5 size targets - instructed to use LUE to complete sequence colored targets (red/white/blue) - 25% accuracy with 3.84 second reaction time. She also completed user paced targets with LUE with bias towards L side to promote L attention as well. Provided her L elbow guard due to allow protection from bumping into objects during bed mobility or functional mobility tasks. With time remaining, worked on functional gait training where she ambulated 127ft with CGA (fading to [place]) and  RW - cues for safety awareness and L foot/knee awareness during stance phase to reduce risk of buckling. Pt returned to room in w/c and completed ambulatory transfer with minA and no AD back to bed. She was able to complete bed mobility with supervision but needs cues for L hemibody awareness. LUE supported with pillow and all needs in reach at end of session. Handoff of care to RN who was present as bedside  2nd session: Pt received supine in bed, agreeable to therapy. No reports of pain but did get voltaren gel on the palmer side of her L hand. Assisted pt in donning medical glove to allow gel to remain on hand while completing functional gait training. Supine<>sit with supervision. Stand<>pivot transfer with CGA towards stronger R side and no AD to w/c. Wheeled to main rehab gym for time management. Gait training completed where she ambulated 219ft + 1109ft with mostly CGA (intermittent minA) and RW on level ground - gait included laps around the therapy gym with wide turns. Continues to require cues for safety awareness, LLE awareness, and RW management. However, pt very pleased with progress. We also dicussed biopsychosocial factors related to stroke recovery, return to work, driving, etc - pt appreciative. Returned to room and completed stand<>pivot transfer back to bed with CGA and no AD. She remained supine in bed with needs in reach and bed alarm on.   Therapy Documentation Precautions:  Precautions Precautions: Fall Precaution Comments: poor L side sensory awareness Restrictions Weight Bearing Restrictions: No General:  Therapy/Group: Individual Therapy  [name] 05/23/2021, 7:44 AM

## 2021-05-23 NOTE — Progress Notes (Signed)
Occupational Therapy Session Note  Patient Details  Name: [name] MRN: [id number] Date of Birth: 14-Feb-1956  Today's Date: 05/23/2021 OT Individual Time: 4081-4481 OT Individual Time Calculation (min): 60 min    Short Term Goals: Week 3:  OT Short Term Goal 1 (Week 3): continuation of LTGs as pt's LOS extended.  Skilled Therapeutic Interventions/Progress Updates:    Pt received in bed and requested to use the bathroom. Pt sat up to EOB with S and stood with close S to RW.  Using RW pt ambulated with CGA to the bathroom and cuing herself outloud saying "Left foot, right foot..." In bathroom, pt demonstrated increased balance with clothing management and cleansing. Pt able to have a BM.  Ambulated to sink and pt requested to sit to complete oral care.  Pt taken to gym to work on arm bike for L arm but she was having too much pain trying to grip handle.  Switched to having pt hold a hula hoop and practice "driving" the hoop to work on controlled coordination of arm.  Continued with various arm AROM exercises.  Pt returned to room and her NT was making her bed. NT stated she would help pt transfer to bed.    Therapy Documentation Precautions:  Precautions Precautions: Fall Precaution Comments: poor L side sensory awareness Restrictions Weight Bearing Restrictions: No      Pain:  c/o L hand pain with heavy gripping, advised pt to not overwork her hand with self strengthening exercises   ADL: ADL Eating: Independent Grooming: Modified independent Where Assessed-Grooming: Sitting at sink Upper Body Bathing: Supervision/safety Where Assessed-Upper Body Bathing: Shower Lower Body Bathing: Contact guard Where Assessed-Lower Body Bathing: Shower Upper Body Dressing: Setup Where Assessed-Upper Body Dressing: Chair Lower Body Dressing: Contact guard Where Assessed-Lower Body Dressing: Chair Toileting: Minimal assistance Where Assessed-Toileting: Glass blower/designer:  Therapist, music Method: Counselling psychologist: Energy manager: Metallurgist Method: Optometrist: Radio broadcast assistant, Energy manager: Curator Method: Heritage manager: Radio broadcast assistant, Grab bars   Therapy/Group: Individual Therapy  [place] 05/23/2021, 12:34 PM

## 2021-05-23 NOTE — Progress Notes (Addendum)
Inpatient Rehabilitation Care Coordinator Discharge Note  The overall goal for the admission was met for:   Discharge location: [place]  Length of Stay: [id number]  Discharge activity level: Yes-SUPERVISION-MIN ASSIST LEVEL  Home/community participation: Yes  Services provided included: MD, RD, PT, OT, SLP, RN, CM, Pharmacy, Neuropsych, and SW  Financial Services: Private Insurance: [employer]  Choices offered to/list presented to: PT AND DAUGHTER  Follow-up services arranged: Home Health: [facility]-PT,OT,SP, DME: ROLLING WALKER AND 3 IN 1, and Patient/Family request agency HH: HAD BEFORE, DME: NO PREF  wheelchair also  Comments (or additional information):DAUGHTER AND SON WERE IN FOR EDUCATION AND FEEL PREPARED TO PROVIDE HER CARE AT [facility]  Patient/Family verbalized understanding of follow-up arrangements: Yes  Individual responsible for coordination of the follow-up plan: KRISTEN [phone]  Confirmed correct DME delivered: [name] 05/23/2021    [name]

## 2021-05-24 DIAGNOSIS — G2581 Restless legs syndrome: Secondary | ICD-10-CM

## 2021-05-24 LAB — GLUCOSE, CAPILLARY
Glucose-Capillary: 119 mg/dL — ABNORMAL HIGH (ref 70–99)
Glucose-Capillary: 79 mg/dL (ref 70–99)
Glucose-Capillary: 130 mg/dL — ABNORMAL HIGH (ref 70–99)
Glucose-Capillary: 110 mg/dL — ABNORMAL HIGH (ref 70–99)

## 2021-05-24 MED ORDER — ROPINIROLE HCL 1 MG PO TABS
0.5000 mg | ORAL_TABLET | Freq: Every day | ORAL | Status: DC
  Administered 2021-05-24 – 2021-05-26 (×3): 0.5 mg via ORAL
  Filled 2021-05-24 (×3): qty 1

## 2021-05-24 MED ORDER — GLYBURIDE 5 MG PO TABS
5.0000 mg | ORAL_TABLET | Freq: Every day | ORAL | Status: DC
  Administered 2021-05-25 – 2021-05-27 (×3): 5 mg via ORAL
  Filled 2021-05-24 (×3): qty 1

## 2021-05-24 MED ORDER — AMLODIPINE BESYLATE 10 MG PO TABS
10.0000 mg | ORAL_TABLET | Freq: Every day | ORAL | Status: DC
  Administered 2021-05-24 – 2021-05-26 (×3): 10 mg via ORAL
  Filled 2021-05-24 (×3): qty 1

## 2021-05-24 MED ORDER — INSULIN DETEMIR 100 UNIT/ML ~~LOC~~ SOLN
10.0000 [IU] | Freq: Two times a day (BID) | SUBCUTANEOUS | Status: DC
  Administered 2021-05-24 – 2021-05-27 (×6): 10 [IU] via SUBCUTANEOUS
  Filled 2021-05-24 (×8): qty 0.1

## 2021-05-24 NOTE — Progress Notes (Signed)
[place] PHYSICAL MEDICINE & REHABILITATION PROGRESS NOTE  Subjective/Complaints:  Pt reports concern for restless leg syndrome- not on Requip and was on at home- it's "awful" right now.  Also was never on insulin- was on Jardiance vs Januvia, Diabetia, metformin, and Actos.   Also notes 'her meds at pharmacy have been cancelled"- told her we would send meds when she left.    ROS:  Pt denies SOB, abd pain, CP, N/V/C/D, and vision changes   Objective: Vital Signs: Blood pressure (!) 147/76, pulse 76, temperature 97.8 F (36.6 C), temperature source Oral, resp. rate 19, SpO2 98 %. No results found. No results for input(s): WBC, HGB, HCT, PLT in the last 72 hours. No results for input(s): NA, K, CL, CO2, GLUCOSE, BUN, CREATININE, CALCIUM in the last 72 hours.   Intake/Output Summary (Last 24 hours) at 05/24/2021 1027 Last data filed at 05/24/2021 0855 Gross per 24 hour  Intake 720 ml  Output --  Net 720 ml        Physical Exam: BP (!) 147/76 (BP Location: Right Arm)   Pulse 76   Temp 97.8 F (36.6 C) (Oral)   Resp 19   SpO2 98%     General: awake, alert, appropriate, sitting up in bed; c/o RLS Sx's the most;  NAD HENT: conjugate gaze; oropharynx moist CV: regular rate; no JVD Pulmonary: CTA B/L; no W/R/R- good air movement GI: soft, NT, ND, (+)BS; normoactive Psychiatric: appropriate; discussing medical issues much more than I've seen in past.  Neurological: alert- very interactive; appropriate Extremities: No clubbing, cyanosis, or edema Skin: No evidence of breakdown, no evidence of rash Follows commands.  Left inattention, some improvement Fair insight and awareness.  Motor: LUE 4-4+/5 prox to distal with mild apraxia, improving LLE: 4--4/5 prox to distal, improving  Assessment/Plan: 1. Functional deficits which require 3+ hours per day of interdisciplinary therapy in a comprehensive inpatient rehab setting. Physiatrist is providing close team supervision  and 24 hour management of active medical problems listed below. Physiatrist and rehab team continue to assess barriers to discharge/monitor patient progress toward functional and medical goals   Care Tool:  Bathing    Body parts bathed by patient: Chest, Abdomen, Right upper leg, Left upper leg, Face, Left arm, Front perineal area, Buttocks, Right arm, Right lower leg, Left lower leg   Body parts bathed by helper: Right lower leg, Left lower leg     Bathing assist Assist Level: Contact Guard/Touching assist     Upper Body Dressing/Undressing Upper body dressing   What is the patient wearing?: Pull over shirt, Bra    Upper body assist Assist Level: Set up assist    Lower Body Dressing/Undressing Lower body dressing      What is the patient wearing?: Pants, Underwear/pull up     Lower body assist Assist for lower body dressing: Minimal Assistance - Patient > 75%     Toileting Toileting    Toileting assist Assist for toileting: Contact Guard/Touching assist     Transfers Chair/bed transfer  Transfers assist     Chair/bed transfer assist level: Contact Guard/Touching assist     Locomotion Ambulation   Ambulation assist      Assist level: Minimal Assistance - Patient > 75% Assistive device: Walker-rolling Max distance: 149ft   Walk 10 feet activity   Assist  Walk 10 feet activity did not occur: Safety/medical concerns  Assist level: Minimal Assistance - Patient > 75% Assistive device: Walker-rolling   Walk 50 feet activity  Assist Walk 50 feet with 2 turns activity did not occur: Safety/medical concerns  Assist level: Minimal Assistance - Patient > 75% Assistive device: Walker-rolling    Walk 150 feet activity   Assist Walk 150 feet activity did not occur: Safety/medical concerns  Assist level: Minimal Assistance - Patient > 75% Assistive device: Walker-rolling    Walk 10 feet on uneven surface  activity   Assist Walk 10 feet on  uneven surfaces activity did not occur: Safety/medical concerns         Wheelchair     Assist Will patient use wheelchair at discharge?: No   Wheelchair activity did not occur: N/A         Wheelchair 50 feet with 2 turns activity    Assist    Wheelchair 50 feet with 2 turns activity did not occur: N/A       Wheelchair 150 feet activity     Assist  Wheelchair 150 feet activity did not occur: N/A        Medical Problem List and Plan: 1.  Left side hemiparesis to right thalamic infarction.  Status post mechanical thrombectomy of right PCA P2 occlusion 04/19/2021.  Status post loop recorder  Con't PT and OT   WHO nightly 2.  Antithrombotics: -DVT/anticoagulation: SCDs  LLE doppler negative for DVT. Discussed with patient and she is reassured by this             -antiplatelet therapy: Aspirin 81 mg daily, Plavix 75 mg daily until cardiac surgery  3. Left arm/hand pain: Discussed could be from tendinosis-overuse of flexors in this arm/hand. Recommended to start with icing 15mg  TID and will proceed to voltaren gel if needed. She is agreeable. Discussed with therapy and nursing. Rest hand and arm between therapy sessions.   Tylenol as needed  Robaxin as needed started on 5/20, conitnue  Continue low dose gabapentin 100mg  qhs 4. Mood: Zoloft 100 mg daily, melatonin as needed.  Provide emotional support             -antipsychotic agents: N/A 5. Neuropsych: This patient is capable of making decisions on her own behalf.  Discussed with Neuropsych - appreciate eval 6. Skin/Wound Care: Routine skin checks 7. Fluids/Electrolytes/Nutrition: Routine in and outs 8.  Diabetes mellitus with hyperglycemia.  Hemoglobin A1c 9.3.    Patient on Actos, Januvia 100 mg, Glucotrol XL 5 mg daily, metformin 1000 mg twice daily prior to admission.    Currently on NovoLog 3 units 3 times daily with meals  Glucophage at 500mg  bid, increased to 1000 BID on 5/20  Diabetic teaching  Levemir  to 11 units BID on 5/30, increased to 12 BID on 6/2 CBG (last 3)  Recent Labs    05/23/21 1641 05/23/21 2109 05/24/21 0619  GLUCAP 105* 98 119*   6/11- wasn't on insulin at home- asked to restart home meds- will restart Glyburide and see if need to restart Actos and/or Jardiance/Januvia- said will not use insulin at home, unless has no choice. Decreased Levemir to 10 units BID.  9. Hypertension.  Cozaar 25 mg twice daily, increased to 50 on 5/24, changed to 75 BID on 5/27, decreased to 50 BID on 5/31  Norvasc 2.5 started on 6/1, changed to qhs on 6/3  Hydralizine 10 qhs started on 5/31, increased on 6/1 Vitals:   05/23/21 1934 05/24/21 0515  BP: (!) 157/90 (!) 147/76  Pulse: 73 76  Resp: 18 19  Temp: 98.2 F (36.8 C) 97.8 F (36.6 C)  SpO2: 98% 98%  will increase norvasc to 5mg , on 6/8, continue to monitor for effects , may need further adjustment in 1-2 days if no improvement  6/11- BP still a little elevated- will increase Norvasc to 10 mg daily.  10.  Hyperlipidemia.  Lipitor 11.  Obesity.  BMI 33.19.  Dietary follow-up 12.  GERD.  Protonix 13. Constipation: scheduled senna-s, encourage liquids, fruits, vegetables             -dulcolax suppository/fleet enema prn  Adjust bowel meds as necessary 14. Cough: COVID negative             -appears to be mostly pharyngeal, upper airway             -Mucinex DM 15.  Hyponatremia  Sodium 131 on 6/3  Continue to monitor 16.  Leukocytosis: Resolved  Continue to monitor  Afebrile  Continue to monitor 17. Aortic valve mass: discussed finding with patient and plan for outpatient follow-up/removal.  18. Cough: since current regimen not helping, cough is very bothersome to her, imaging is negative for infection, and dextromethorphan may be contributing to her fatigue, changed to Mucinex to 102mL TID guaifensin-codeine. Change to PRN 6/7 19. AKI: Resolved  Cr 0.80 on 6/3  Encourage fluids 20.  Hyperkalemia  Lokelma ordered x1. Discussed  that K+ has normalized 6/4 21. Proteus UTI: bactrim and probiotic started 6/7- afeb 6/8 22. Restless leg syndrome  6/11- restart Requip- don't see a contraindication for this.    LOS: 25 days A FACE TO FACE EVALUATION WAS PERFORMED  [name] 05/24/2021, 10:27 AM

## 2021-05-24 NOTE — Progress Notes (Signed)
Occupational Therapy Session Note  Patient Details  Name: [name] MRN: [id number] Date of Birth: 1956-06-08  Today's Date: 05/24/2021 OT Individual Time: 1100-1200 OT Individual Time Calculation (min): 60 min   Today's Date: 05/24/2021 OT Individual Time: 5916-3846 OT Individual Time Calculation (min): 40 min   Short Term Goals: Week 3:  OT Short Term Goal 1 (Week 3): continuation of LTGs as pt's LOS extended.  Skilled Therapeutic Interventions/Progress Updates:     Pt received in  bed with no pain reported. Agreeable to shower level BADL  ADL:  Pt completes bathing with S level bathing with lateral leans. Good fucntionla use of LUE Pt completes UB dressing with S seated in w/c Pt completes LB dressing with MIN A at sit to stand level with A to pull pants past L hip Pt completes footwear with S seated figure 4 Pt completes shower/Tub transfer with CGA overall to ambulate into bathroom with RW  Therapeutic activity Pt works on [employer] with LUE to pick up pegs from table and from OT hand and place into small bucket at midline. Pt wit grip slips 75% of time as pt grasping too hard and pegs sliding out. VC for decreased force/calibration  Pt left at end of session in bed with exit alarm on, call light in reach and all needs met   Pt received in bed no pain reported   Session 2: ADL: reporting pain in L calf- no redness/swelling/heat noted. Pt reporting muscle cramping increasing. OT alerted RN who provided muscle relaxer at end of session. Heal applied at end of session  Pt completes toileting with MIN A to pull pants up past L hip in standing. S level for balance instanding while completing clothing management Pt completes toileting transfer with very close supervision-CGA during functional mobility into and out of bathroom with RW and hand splint.   Therapeutic activity Engaged in tx at high low table playing game of connect for and manging different  nuts/bolts/fasteners for LUE NMR/FMC with VC for decreased shoulder hike and abduction when reaching. Exited session with pt seated in bed, exit alarm on and call light in reach    Therapy Documentation Precautions:  Precautions Precautions: Fall Precaution Comments: poor L side sensory awareness Restrictions Weight Bearing Restrictions: No General:   Vital Signs: Therapy Vitals Temp: 97.8 F (36.6 C) Temp Source: Oral Pulse Rate: 76 Resp: 19 BP: (!) 147/76 Patient Position (if appropriate): Lying Oxygen Therapy SpO2: 98 % O2 Device: Room Air Pain:   ADL: ADL Eating: Independent Grooming: Modified independent Where Assessed-Grooming: Sitting at sink Upper Body Bathing: Supervision/safety Where Assessed-Upper Body Bathing: Shower Lower Body Bathing: Contact guard Where Assessed-Lower Body Bathing: Shower Upper Body Dressing: Setup Where Assessed-Upper Body Dressing: Chair Lower Body Dressing: Contact guard Where Assessed-Lower Body Dressing: Chair Toileting: Minimal assistance Where Assessed-Toileting: Glass blower/designer: Therapist, music Method: Counselling psychologist: Energy manager: Metallurgist Method: Optometrist: Radio broadcast assistant, Energy manager: Curator Method: Heritage manager: Radio broadcast assistant, Systems analyst    Praxis   Exercises:   Other Treatments:     Therapy/Group: Individual Therapy  [name] 05/24/2021, 6:48 AM

## 2021-05-25 LAB — GLUCOSE, CAPILLARY
Glucose-Capillary: 134 mg/dL — ABNORMAL HIGH (ref 70–99)
Glucose-Capillary: 118 mg/dL — ABNORMAL HIGH (ref 70–99)
Glucose-Capillary: 71 mg/dL (ref 70–99)
Glucose-Capillary: 103 mg/dL — ABNORMAL HIGH (ref 70–99)

## 2021-05-25 NOTE — Progress Notes (Addendum)
Physical Therapy Session Note  Patient Details  Name: [name] MRN: [id number] Date of Birth: [birth year]  Today's Date: 05/25/2021 PT Individual Time: 0905-1000 PT Individual Time Calculation (min): 55 min   Short Term Goals: Week 2:  PT Short Term Goal 1 (Week 2): Pt will complete bed mobility with CGA PT Short Term Goal 1 - Progress (Week 2): Met PT Short Term Goal 2 (Week 2): Pt will complete bed<>chair transfers with CGA and LRAD PT Short Term Goal 2 - Progress (Week 2): Not met (More consistently requires minA) PT Short Term Goal 3 (Week 2): Pt will ambulate 76f with modA and LRAD PT Short Term Goal 3 - Progress (Week 2): Met  Skilled Therapeutic Interventions/Progress Updates:     Patient in bed with her son in the room upon PT arrival. Patient alert and agreeable to PT session. Patient reported 0-8/10 L calf pain with DF stretch or standing during session, RN made aware. PT provided repositioning, rest breaks, and distraction as pain interventions throughout session. Noted trigger point with positive jump response on medial gastroc muscle belly, performed trigger point release and soft tissue mobilization during rest breaks for pain relief.   Patient's daughter arrived shortly after start of session. Patient's son and daughter participated in family education and hands-on training throughout session. Patient's daughter to be primary caregiver. Demonstrated safe guarding technique following demonstration throughout session. Educated family about patient's impulsivity and L inattention, management strategies, and safety concerns due to these deficits throughout session.   Therapeutic Activity: Bed Mobility: Patient performed supine to/from sit with supervision for safety due to impulsivity.  Transfers: Patient performed sit to/from stand from the bed using the RW and from the w/c using the stair rail x2 with CGA-min A. Provided verbal cues for placing L hand into the splint on  the RW, removing L hand from the splint, pushing up and reaching back with her R hand for safety, and completing turns prior to sitting. Patient required cues each trial, encouraged family to use cues consistently at this time for patient safety, patient and family in agreement.  Patient performed a simulated sedan height car transfer with CGA using RW. Provided cues for safe technique.  Gait Training:  Patient ambulated household distances, 25-50 feet, x3 using RW with L hand splint with CGA-min A. Ambulated with intermittent step-to gait pattern due to L calf pain, decreased gait speed, step height and step length L>R, intermittent L extensor thrust, forward trunk lean with mild downward head gaze, and decreased AD control with L hand intermittently. Provided verbal looking ahead, hamstring activation in L stance to control extensor thrust, and leading with her heel to improved foot clearance and reciprocal gait pattern. Patient ascended/descended 4x6" steps using B or R rail with min-mod A +2 on first trial due to patient attempting to lead with her L while ascending and R while descending despite therapist's cues, educated patient on purpose of leading with her uninvolved limb while ascending and involved limb while descending to improved safety on steps and demonstrated for patient and her family. Also, discouraged use of L hand on the L rail at this time due to decreased attention and patient with poor safety awareness with her L hand on the rail during first trial. Performed second trial using R rail only with CGA and following cues for sequencing provided by her daughter.   Wheelchair Mobility:  Patient was transported in the w/c with total A throughout session for energy conservation and time  management.  Educated patient and her family on fall risk/prevention, home modifications to prevent falls, and activation of emergency services in the event of a fall during session.   Patient in bed with  her family at bedside at end of session with breaks locked, bed alarm set, and all needs within reach.   Therapy Documentation Precautions:  Precautions Precautions: Fall Precaution Comments: poor L side sensory awareness Restrictions Weight Bearing Restrictions: No    Therapy/Group: Individual Therapy  Krysti Hickling L Nasira Janusz PT, DPT  05/25/2021, 4:36 PM

## 2021-05-25 NOTE — Progress Notes (Signed)
Seemed to rest better with scheduled requip. At 2149, PRN melotonin given. PRN tylenol given and voltaren applied to left hand at 0112. C/O discomfort to posterior knee and "cramps" to left calf. Talked with patient again R/T getting BP machine to use at home and recording info. Patrici Ranks A

## 2021-05-25 NOTE — Discharge Summary (Signed)
Physician Discharge Summary  Patient ID: [name] MRN: [id number] DOB/AGE: 1956-07-02 65 y.o.  Admit date: 04/29/2021 Discharge date: 05/27/2021  Discharge Diagnoses:  Principal Problem:   Right thalamic infarction [facility]) Active Problems:   Leukocytosis   Hyponatremia   Essential hypertension   Slow transit constipation   Hemiparesis affecting left side as late effect of stroke ([place])   Uncontrolled type 2 diabetes mellitus with hyperglycemia (HCC)   Labile blood glucose   Chronic bilateral low back pain without sciatica   Hyperkalemia Mood stabilization GERD Obesity Aortic valve mass Proteus UTI  Discharged Condition: Stable  Significant Diagnostic Studies: DG CHEST PORT 1 VIEW  Result Date: 04/28/2021 CLINICAL DATA:  Cough. EXAM: PORTABLE CHEST 1 VIEW COMPARISON:  02/25/2016 FINDINGS: The lungs are clear without focal pneumonia, edema, pneumothorax or pleural effusion. The cardiopericardial silhouette is within normal limits for size. The visualized bony structures of the thorax show no acute abnormality. IMPRESSION: No active disease. Electronically Signed   By: Misty Stanley M.D.   On: 04/28/2021 12:08   DG Swallowing Func-Speech Pathology  Result Date: 04/28/2021 Objective Swallowing Evaluation: Type of Study: MBS-Modified Barium Swallow Study  Patient Details Name: [name] MRN: [id number] Date of Birth: 1956/08/03 Today's Date: 04/28/2021 Time: SLP Start Time (ACUTE ONLY): 20 -SLP Stop Time (ACUTE ONLY): 6629 SLP Time Calculation (min) (ACUTE ONLY): 13 min Past Medical History: Past Medical History: Diagnosis Date  Anxiety   Arthritis   Cataract   left  Depression   Diabetes mellitus without complication ([place])   Environmental and seasonal allergies   GERD (gastroesophageal reflux disease)   Hyperlipidemia   Hypertension   Joint pain   as reported by patient  Urinary incontinence   as stated by patient Past Surgical History: Past Surgical History: Procedure  Laterality Date  BREAST EXCISIONAL BIOPSY Right 1999  neg  BREAST LUMPECTOMY Right 2005  as reported by patient  BUBBLE STUDY  04/24/2021  Procedure: BUBBLE STUDY;  Surgeon: Sueanne Margarita, MD;  Location: [place];  Service: Cardiovascular;;  CATARACT EXTRACTION  January 2013  CATARACT EXTRACTION W/PHACO Left 12/02/2016  Procedure: CATARACT EXTRACTION PHACO AND INTRAOCULAR LENS PLACEMENT ([place]);  Surgeon: Estill Cotta, MD;  Location: [facility];  Service: Ophthalmology;  Laterality: Left;  [place] 2:14 AP% 28.4 CDE 59.73 Fluid pack lot # [id number]  DIAGNOSTIC LAPAROSCOPY    IR CT HEAD LTD  04/19/2021  IR PERCUTANEOUS ART THROMBECTOMY/INFUSION INTRACRANIAL INC DIAG ANGIO  04/19/2021     IR PERCUTANEOUS ART THROMBECTOMY/INFUSION INTRACRANIAL INC DIAG ANGIO  04/19/2021  IR US GUIDE VASC ACCESS LEFT  04/19/2021  RADIOLOGY WITH ANESTHESIA N/A 04/19/2021  Procedure: IR WITH ANESTHESIA;  Surgeon: Luanne Bras, MD;  Location: [place];  Service: Radiology;  Laterality: N/A;  TEE WITHOUT CARDIOVERSION N/A 04/24/2021  Procedure: TRANSESOPHAGEAL ECHOCARDIOGRAM (TEE);  Surgeon: Sueanne Margarita, MD;  [facility];  Service: Cardiovascular;  Laterality: N/A;  TOTAL HIP ARTHROPLASTY Right 04/04/2018  Procedure: TOTAL HIP ARTHROPLASTY;  Surgeon: Dereck Leep, MD;  Location: [facility];  Service: Orthopedics;  Laterality: Right; HPI: [name] is a 65 y.o. female with history of poorly controlled diabetes and hypertension, hyperlipidemia, obesity, anxiety, depression, presenting with acute dizziness left-sided weakness, left-sided numbness, and possible visual deficits. She received IV tPA  via telemedicine at [facility] 04/18/2021 and was found to acute nonhemorrhagic lateral aspect of the right thalamus. Acute nonhemorrhagic infarcts involving the more superior and medial right thalamus, the right occipital lobe, the left  side of the splenium of the corpus callosum, and the right cerebellum - embolic - etiology unknown.  Initially passed [facility] on 5/7, but has been coughing often, and repeat [facility] administed on 5/16 which she failed due to coughing.  Subjective: pt awake in bed Assessment / Plan / Recommendation CHL IP CLINICAL IMPRESSIONS 04/28/2021 Clinical Impression  Pt demonstrates no aspiration or dysphagia though she did cough somewhat often during assessment, particularly at the end. When questioned about reflux, pt both denied it and told a story about having bad heartburn with certain foods. Esophageal sweep unrevealing. Reported findings to NP. Pt may continue a regular diet and thin liquids. SLP Visit Diagnosis Dysphagia, unspecified (R13.10) Attention and concentration deficit following -- Frontal lobe and executive function deficit following -- Impact on safety and function Mild aspiration risk   CHL IP TREATMENT RECOMMENDATION 04/28/2021 Treatment Recommendations No treatment recommended at this time   No flowsheet data found. CHL IP DIET RECOMMENDATION 04/28/2021 SLP Diet Recommendations Regular solids;Thin liquid Liquid Administration via Cup;Straw Medication Administration Whole meds with liquid Compensations Slow rate;Small sips/bites Postural Changes Remain semi-upright after after feeds/meals (Comment);Seated upright at 90 degrees   CHL IP OTHER RECOMMENDATIONS 04/28/2021 Recommended Consults -- Oral Care Recommendations Oral care BID Other Recommendations --   CHL IP FOLLOW UP RECOMMENDATIONS 04/28/2021 Follow up Recommendations Inpatient Rehab   No flowsheet data found.     CHL IP ORAL PHASE 04/28/2021 Oral Phase WFL Oral - Pudding Teaspoon -- Oral - Pudding Cup -- Oral - Honey Teaspoon -- Oral - Honey Cup -- Oral - Nectar Teaspoon -- Oral - Nectar Cup -- Oral - Nectar Straw -- Oral - Thin Teaspoon -- Oral - Thin Cup -- Oral - Thin Straw -- Oral - Puree -- Oral - Mech Soft -- Oral - Regular -- Oral - Multi-Consistency -- Oral - Pill -- Oral Phase - Comment --  CHL IP PHARYNGEAL PHASE 04/28/2021 Pharyngeal Phase WFL  Pharyngeal- Pudding Teaspoon -- Pharyngeal -- Pharyngeal- Pudding Cup -- Pharyngeal -- Pharyngeal- Honey Teaspoon -- Pharyngeal -- Pharyngeal- Honey Cup -- Pharyngeal -- Pharyngeal- Nectar Teaspoon -- Pharyngeal -- Pharyngeal- Nectar Cup -- Pharyngeal -- Pharyngeal- Nectar Straw -- Pharyngeal -- Pharyngeal- Thin Teaspoon -- Pharyngeal -- Pharyngeal- Thin Cup -- Pharyngeal -- Pharyngeal- Thin Straw -- Pharyngeal -- Pharyngeal- Puree -- Pharyngeal -- Pharyngeal- Mechanical Soft -- Pharyngeal -- Pharyngeal- Regular -- Pharyngeal -- Pharyngeal- Multi-consistency -- Pharyngeal -- Pharyngeal- Pill -- Pharyngeal -- Pharyngeal Comment --  No flowsheet data found. DeBlois, Katherene Ponto 04/28/2021, 2:42 PM              VAS [place] LOWER EXTREMITY VENOUS (DVT)  Result Date: 05/13/2021  Lower Venous DVT Study Patient Name:  [name]  Date of Exam:   05/13/2021 Medical Rec #: [id number]        Accession #:    [id number] Date of Birth: 05/14/1956       Patient Gender: F Patient Age:   64Y Exam Location:  [facility] Procedure:      VAS [place] LOWER EXTREMITY VENOUS (DVT) Referring Phys: [id number] ANKIT ANIL PATEL --------------------------------------------------------------------------------  Indications: Pain.  Comparison Study: no prior Performing Technologist: Abram Sander RVS  Examination Guidelines: A complete evaluation includes B-mode imaging, spectral Doppler, color Doppler, and power Doppler as needed of all accessible portions of each vessel. Bilateral testing is considered an integral part of a complete examination. Limited examinations for reoccurring indications may be performed as noted. The reflux portion of the exam  is performed with the patient in reverse Trendelenburg.  +-----+---------------+---------+-----------+----------+--------------+ RIGHTCompressibilityPhasicitySpontaneityPropertiesThrombus Aging +-----+---------------+---------+-----------+----------+--------------+ CFV  Full            Yes      Yes                                 +-----+---------------+---------+-----------+----------+--------------+   +---------+---------------+---------+-----------+----------+--------------+ LEFT     CompressibilityPhasicitySpontaneityPropertiesThrombus Aging +---------+---------------+---------+-----------+----------+--------------+ CFV      Full           Yes      Yes                                 +---------+---------------+---------+-----------+----------+--------------+ SFJ      Full                                                        +---------+---------------+---------+-----------+----------+--------------+ FV Prox  Full                                                        +---------+---------------+---------+-----------+----------+--------------+ FV Mid   Full                                                        +---------+---------------+---------+-----------+----------+--------------+ FV DistalFull                                                        +---------+---------------+---------+-----------+----------+--------------+ PFV      Full                                                        +---------+---------------+---------+-----------+----------+--------------+ POP      Full           Yes      Yes                                 +---------+---------------+---------+-----------+----------+--------------+ PTV      Full                                                        +---------+---------------+---------+-----------+----------+--------------+ PERO     Full                                                        +---------+---------------+---------+-----------+----------+--------------+  Summary: RIGHT: - No evidence of common femoral vein obstruction.  LEFT: - There is no evidence of deep vein thrombosis in the lower extremity.  - No cystic structure found in the popliteal fossa.  *See table(s) above for  measurements and observations. Electronically signed by Deitra Mayo MD on 05/13/2021 at 4:14:33 PM.    Final     Labs:  Basic Metabolic Panel: No results for input(s): NA, K, CL, CO2, GLUCOSE, BUN, CREATININE, CALCIUM, MG, PHOS in the last 168 hours.   CBC: No results for input(s): WBC, NEUTROABS, HGB, HCT, MCV, PLT in the last 168 hours.  CBG: Recent Labs  Lab 05/26/21 0610 05/26/21 1212 05/26/21 1629 05/26/21 1717 05/26/21 2105  GLUCAP 104* 73 50* 83 122*    Brief HPI:   [name] is a 65 y.o. right-handed female with history of uncontrolled diabetes mellitus hypertension hyperlipidemia remote tobacco abuse obesity with BMI 33.45 anxiety/depression.  Patient lives alone independent prior to admission and still working.  1 level home 3 steps to entry.  She plans to stay with her daughter and son-in-law on discharge.  Presented 04/19/2021 with acute onset of left-sided weakness and poor balance as well as dizziness.  CT/MRI as well as MRA showed small acute infarct of the anterior lateral right thalamus adjacent to the posterior limb of the right internal capsule.  Right PCA and P1 segment occlusion.  Patient did receive tPA.  Admission chemistries unremarkable except sodium 133 chloride 96 glucose 272 BUN 25 creatinine 1.09 hemoglobin A1c 9.3.  Patient did undergo thrombectomy 04/19/2021 with complete recanalization per interventional radiology.  Echocardiogram with ejection fraction of 70 to 75% no wall motion abnormalities grade 1 diastolic dysfunction.  Maintained on aspirin 81 mg daily and Plavix 75 mg daily for CVA prophylaxis.  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  No clot/CT coronary morphology and loop recorder placement 04/24/2021.  Patient did develop a bit of a nonproductive cough chest x-ray showed no active disease and SARS coronavirus negative.  Follow-up  swallow study maintain on a regular diet.  Due to patient's left-sided weakness decreased functional mobility was admitted for a comprehensive rehab program.   Hospital Course: [name] was admitted to rehab 04/29/2021 for inpatient therapies to consist of PT, ST and OT at least three hours five days a week. Past admission physiatrist, therapy team and rehab RN have worked together to provide customized collaborative inpatient rehab.  Pertaining to patient's right thalamic infarction status post mechanical thrombectomy status post loop recorder.  Patient remained on aspirin Plavix therapy and would follow-up with neurology services.  Mood stabilization with Zoloft emotional support provided as well as follow-up per neuropsychology.  Blood sugars monitored hemoglobin A1c 9.3 currently on NovoLog as well as Levemir with Glucophage initiated as well as low-dose glyburide.  Full diabetic teaching completed patient would need outpatient follow-up.  Blood pressure controlled and monitored on Norvasc as well as hydralazine and Cozaar 50 mg twice daily.  Lipitor ongoing for hyperlipidemia.  Patient with left arm and hand pain discussed tendinosis overuse of flexors and arm and hand maintained on Robaxin as well as low-dose gabapentin with Voltaren gel.  Patient obese BMI 33.19 dietary follow-up.  In regards to incidental finding of aortic valve mass discussed findings with patient plan outpatient follow-up possible surgical intervention with cardiology services.  Proteus UTI completing course of Bactrim.  No dysuria or hematuria.   Blood pressures were monitored on TID basis and controlled  Diabetes has been monitored with ac/hs CBG checks and SSI was use prn for tighter BS control.    Rehab course: During patient's stay in rehab weekly team conferences were held to monitor patient's progress, set goals and discuss barriers to discharge. At admission, patient required max assist stand pivot transfers max  assist squat pivot transfers moderate assist supine to sit and sit to supine.  Max assist ambulate 3 feet to person hand-held assist.  Moderate assist upper body bathing max assist lower body bathing moderate assist upper body dressing max assist lower body dressing  Physical exam.  Blood pressure 158/75 pulse 73 temperature 98.4 respirations 18 oxygen saturations 91% room air Constitutional.  No acute distress HEENT Head.  Normocephalic and atraumatic Eyes.  Pupils round and reactive to light no discharge without nystagmus Neck.  Supple nontender no JVD without thyromegaly Cardiac regular rate rhythm without extra sounds or murmur heard Abdomen.  Soft nontender positive bowel sounds without rebound Respiratory effort normal no respiratory distress without wheeze Neurologic.  Alert she is a bit dysarthric.  Provides her name follows commands with fair insight and awareness.  Left central 7 without significant tongue deviation.  Left upper extremity 3 -/5 proximal to distal.  Left lower extremity 3+/5 proximal to 3+ to 4/5 distal.  Right upper right lower extremity 4/5.  No sensory changes.  He/She  has had improvement in activity tolerance, balance, postural control as well as ability to compensate for deficits. He/She has had improvement in functional use RUE/LUE  and RLE/LLE as well as improvement in awareness patient completes bathing with supervision level with lateral leans.  Patient completes upper body dressing supervision seated in wheelchair.  Completes lower body dressing minimal assistance sit to stand level with assistance to pull pants past left hip.  Completes footwear with supervision seated.  Completed shower tub transfer with contact-guard assist overall to ambulate to the bathroom with rolling walker.  Patient can ambulate up to 200 feet with contact-guard assist.  Full family teaching completed plan discharged home       Disposition: Discharged to home    Diet: Carb  modified  Special Instructions: No driving smoking or alcohol  Medications at discharge 1.  Tylenol as needed 2.  Norvasc 10 mg p.o. nightly 3.  Aspirin 81 mg p.o. daily 4.  Lipitor 40 mg p.o. daily 5.  Plavix 75 mg p.o. daily 6.  Voltaren gel 2 g 4 times daily to affected area as needed 7.  Neurontin 100 mg p.o. nightly 8.  DiaBeta 5 mg p.o. daily 9.  Hydralazine 10 mg p.o. nightly 10.  Semglee 10 units subcutaneously twice daily 11.  Cozaar 50 mg p.o. twice daily 12.  Glucophage 1000 mg p.o. twice daily 13.  Robaxin 500 mg p.o. twice daily as needed muscle spasms 14  Protonix 40 mg p.o. nightly 15  Requip 0.5 mg p.o. nightly 16  Florastor 250 mg p.o. twice daily 187  Zoloft 100 mg p.o. daily  30-35 minutes were spent completing discharge summary and discharge planning   Discharge Instructions     Ambulatory referral to Neurology   Complete by: As directed    An appointment is requested in approximately 4 weeks right thalamic infarction   Ambulatory referral to Physical Medicine Rehab   Complete by: As directed    Complexity follow-up 1 to 2 weeks right thalamic infarction  Follow-up Information     Thompson Grayer, MD Follow up.   Specialty: Cardiology Why: Call for appointment Contact information: [place] [address] [phone]         Izora Ribas, MD Follow up.   Specialty: Physical Medicine and Rehabilitation Why: office to call for appointment Contact information: [address] [place] [address] [phone]                 Signed: Cathlyn Parsons 05/27/2021, 5:21 AM

## 2021-05-25 NOTE — Progress Notes (Signed)
Occupational Therapy Session Note  Patient Details  Name: [name] MRN: [id number] Date of Birth: [birth year]  Today's Date: 05/25/2021 OT Group Time: 1100-1200 OT Group Time Calculation (min): 60 min  Skilled Therapeutic Interventions/Progress Updates:    Pt engaged in therapeutic w/c level dance group focusing on patient choice, UE/LE strengthening, salience, activity tolerance, and social participation. Pt was guided through various dance-based exercises involving UEs/LEs and trunk. All music was selected by group members. Emphasis placed on Lt NMR and activity tolerance. Pt exhibited high levels of participation while seated, very motivated during bilaterally involved exercises such as arm rolling, clapping, and arm pumps. Improved Lt coordination noted from group session last week, pt having much greater ease meeting motor demands of forward/overhead hand clapping. She was very pleased by this! Declined standing today though was provided with the opportunity. At end of session, pts son escorted her back to the room via w/c.    Therapy Documentation Precautions:  Precautions Precautions: Fall Precaution Comments: poor L side sensory awareness Restrictions Weight Bearing Restrictions: No  Pain: no s/s pain during tx   ADL: ADL Eating: Independent Grooming: Modified independent Where Assessed-Grooming: Sitting at sink Upper Body Bathing: Supervision/safety Where Assessed-Upper Body Bathing: Shower Lower Body Bathing: Contact guard Where Assessed-Lower Body Bathing: Shower Upper Body Dressing: Setup Where Assessed-Upper Body Dressing: Chair Lower Body Dressing: Contact guard Where Assessed-Lower Body Dressing: Chair Toileting: Minimal assistance Where Assessed-Toileting: Glass blower/designer: Therapist, music Method: Counselling psychologist: Energy manager: Metallurgist Method: Engineer, technical sales: Radio broadcast assistant, Energy manager: Curator Method: Heritage manager: Radio broadcast assistant, Grab bars     Therapy/Group: Group Therapy  Skeet Simmer 05/25/2021, 12:50 PM

## 2021-05-25 NOTE — Progress Notes (Signed)
[facility] PHYSICAL MEDICINE & REHABILITATION PROGRESS NOTE  Subjective/Complaints:  Pt reports slept better with Requip- much better.  Main issue is L calf pain- has "knot" in it- that's so tight- doesn't really like needles.     ROS:    Pt denies SOB, abd pain, CP, N/V/C/D, and vision changes   Objective: Vital Signs: Blood pressure (!) 158/75, pulse 79, temperature 97.6 F (36.4 C), resp. rate 20, SpO2 99 %. No results found. No results for input(s): WBC, HGB, HCT, PLT in the last 72 hours. No results for input(s): NA, K, CL, CO2, GLUCOSE, BUN, CREATININE, CALCIUM in the last 72 hours.   Intake/Output Summary (Last 24 hours) at 05/25/2021 1250 Last data filed at 05/25/2021 0900 Gross per 24 hour  Intake 960 ml  Output --  Net 960 ml        Physical Exam: BP (!) 158/75 (BP Location: Right Arm)   Pulse 79   Temp 97.6 F (36.4 C)   Resp 20   SpO2 99%      General: awake, alert, appropriate, NAD HENT: conjugate gaze; oropharynx moist CV: regular rate; no JVD Pulmonary: CTA B/L; no W/R/R- good air movement GI: soft, NT, ND, (+)BS Psychiatric: appropriate Neurological: alert- son in room;  Extremities: No clubbing, cyanosis, or edema- trigger point palpated in L gastroc/soleus- a palpable knot.  Skin: No evidence of breakdown, no evidence of rash Follows commands.  Left inattention, some improvement Fair insight and awareness.  Motor: LUE 4-4+/5 prox to distal with mild apraxia, improving LLE: 4--4/5 prox to distal, improving  Assessment/Plan: 1. Functional deficits which require 3+ hours per day of interdisciplinary therapy in a comprehensive inpatient rehab setting. Physiatrist is providing close team supervision and 24 hour management of active medical problems listed below. Physiatrist and rehab team continue to assess barriers to discharge/monitor patient progress toward functional and medical goals   Care Tool:  Bathing    Body parts bathed by  patient: Chest, Abdomen, Right upper leg, Left upper leg, Face, Left arm, Front perineal area, Buttocks, Right arm, Right lower leg, Left lower leg   Body parts bathed by helper: Right lower leg, Left lower leg     Bathing assist Assist Level: Contact Guard/Touching assist     Upper Body Dressing/Undressing Upper body dressing   What is the patient wearing?: Pull over shirt, Bra    Upper body assist Assist Level: Set up assist    Lower Body Dressing/Undressing Lower body dressing      What is the patient wearing?: Pants, Underwear/pull up     Lower body assist Assist for lower body dressing: Minimal Assistance - Patient > 75%     Toileting Toileting    Toileting assist Assist for toileting: Contact Guard/Touching assist     Transfers Chair/bed transfer  Transfers assist     Chair/bed transfer assist level: Contact Guard/Touching assist     Locomotion Ambulation   Ambulation assist      Assist level: Minimal Assistance - Patient > 75% Assistive device: Walker-rolling Max distance: 167ft   Walk 10 feet activity   Assist  Walk 10 feet activity did not occur: Safety/medical concerns  Assist level: Minimal Assistance - Patient > 75% Assistive device: Walker-rolling   Walk 50 feet activity   Assist Walk 50 feet with 2 turns activity did not occur: Safety/medical concerns  Assist level: Minimal Assistance - Patient > 75% Assistive device: Walker-rolling    Walk 150 feet activity   Assist Walk 150  feet activity did not occur: Safety/medical concerns  Assist level: Minimal Assistance - Patient > 75% Assistive device: Walker-rolling    Walk 10 feet on uneven surface  activity   Assist Walk 10 feet on uneven surfaces activity did not occur: Safety/medical concerns         Wheelchair     Assist Will patient use wheelchair at discharge?: No   Wheelchair activity did not occur: N/A         Wheelchair 50 feet with 2 turns  activity    Assist    Wheelchair 50 feet with 2 turns activity did not occur: N/A       Wheelchair 150 feet activity     Assist  Wheelchair 150 feet activity did not occur: N/A        Medical Problem List and Plan: 1.  Left side hemiparesis to right thalamic infarction.  Status post mechanical thrombectomy of right PCA P2 occlusion 04/19/2021.  Status post loop recorder  Con't PT, OT   WHO nightly 2.  Antithrombotics: -DVT/anticoagulation: SCDs  LLE doppler negative for DVT. Discussed with patient and she is reassured by this             -antiplatelet therapy: Aspirin 81 mg daily, Plavix 75 mg daily until cardiac surgery  3. Left arm/hand pain: Discussed could be from tendinosis-overuse of flexors in this arm/hand. Recommended to start with icing 15mg  TID and will proceed to voltaren gel if needed. She is agreeable. Discussed with therapy and nursing. Rest hand and arm between therapy sessions.   Tylenol as needed  Robaxin as needed started on 5/20, conitnue  Continue low dose gabapentin 100mg  qhs 4. Mood: Zoloft 100 mg daily, melatonin as needed.  Provide emotional support             -antipsychotic agents: N/A 5. Neuropsych: This patient is capable of making decisions on her own behalf.  Discussed with Neuropsych - appreciate eval 6. Skin/Wound Care: Routine skin checks 7. Fluids/Electrolytes/Nutrition: Routine in and outs 8.  Diabetes mellitus with hyperglycemia.  Hemoglobin A1c 9.3.    Patient on Actos, Januvia 100 mg, Glucotrol XL 5 mg daily, metformin 1000 mg twice daily prior to admission.    Currently on NovoLog 3 units 3 times daily with meals  Glucophage at 500mg  bid, increased to 1000 BID on 5/20  Diabetic teaching  Levemir to 11 units BID on 5/30, increased to 12 BID on 6/2 CBG (last 3)  Recent Labs    05/24/21 2058 05/25/21 0618 05/25/21 1205  GLUCAP 110* 134* 118*   6/11- wasn't on insulin at home- asked to restart home meds- will restart Glyburide  and see if need to restart Actos and/or Jardiance/Januvia- said will not use insulin at home, unless has no choice. Decreased Levemir to 10 units BID.   6/12- BG's better today- can restart Actos,e tc to get her off insulin- hopefully before d/c.  9. Hypertension.  Cozaar 25 mg twice daily, increased to 50 on 5/24, changed to 75 BID on 5/27, decreased to 50 BID on 5/31  Norvasc 2.5 started on 6/1, changed to qhs on 6/3  Hydralizine 10 qhs started on 5/31, increased on 6/1 Vitals:   05/24/21 1923 05/25/21 0440  BP: (!) 147/82 (!) 158/75  Pulse: 79 79  Resp: 20 20  Temp: 98 F (36.7 C) 97.6 F (36.4 C)  SpO2: 100% 99%  will increase norvasc to 5mg , on 6/8, continue to monitor for effects , may need further  adjustment in 1-2 days if no improvement  6/11- BP still a little elevated- will increase Norvasc to 10 mg daily.   6/12- just got increased norvasc this AM- will wait a few days before changing.  10.  Hyperlipidemia.  Lipitor 11.  Obesity.  BMI 33.19.  Dietary follow-up 12.  GERD.  Protonix 13. Constipation: scheduled senna-s, encourage liquids, fruits, vegetables             -dulcolax suppository/fleet enema prn  Adjust bowel meds as necessary 14. Cough: COVID negative             -appears to be mostly pharyngeal, upper airway             -Mucinex DM 15.  Hyponatremia  Sodium 131 on 6/3  Continue to monitor 16.  Leukocytosis: Resolved  Continue to monitor  Afebrile  Continue to monitor 17. Aortic valve mass: discussed finding with patient and plan for outpatient follow-up/removal.  18. Cough: since current regimen not helping, cough is very bothersome to her, imaging is negative for infection, and dextromethorphan may be contributing to her fatigue, changed to Mucinex to 76mL TID guaifensin-codeine. Change to PRN 6/7 19. AKI: Resolved  Cr 0.80 on 6/3  Encourage fluids 20.  Hyperkalemia  Lokelma ordered x1. Discussed that K+ has normalized 6/4 21. Proteus UTI: bactrim and  probiotic started 6/7- afeb 6/8 22. Restless leg syndrome  6/11- restart Requip- don't see a contraindication for this.  23. L calf pain  6/12- needs to have dry needling or trigger point injection- d/w pt and PT- to ask Suezanne Jacquet if can do dry needling.   LOS: 26 days A FACE TO FACE EVALUATION WAS PERFORMED  [name] 05/25/2021, 12:50 PM

## 2021-05-26 ENCOUNTER — Other Ambulatory Visit: Payer: Self-pay

## 2021-05-26 LAB — GLUCOSE, CAPILLARY
Glucose-Capillary: 104 mg/dL — ABNORMAL HIGH (ref 70–99)
Glucose-Capillary: 73 mg/dL (ref 70–99)
Glucose-Capillary: 50 mg/dL — ABNORMAL LOW (ref 70–99)
Glucose-Capillary: 83 mg/dL (ref 70–99)
Glucose-Capillary: 122 mg/dL — ABNORMAL HIGH (ref 70–99)

## 2021-05-26 MED ORDER — ROPINIROLE HCL 0.5 MG PO TABS
0.5000 mg | ORAL_TABLET | Freq: Every day | ORAL | 0 refills | Status: DC
  Filled 2021-05-26: qty 30, 30d supply, fill #0

## 2021-05-26 MED ORDER — GLYBURIDE 5 MG PO TABS
5.0000 mg | ORAL_TABLET | Freq: Every day | ORAL | 0 refills | Status: DC
  Filled 2021-05-26: qty 30, 30d supply, fill #0

## 2021-05-26 MED ORDER — ATORVASTATIN CALCIUM 40 MG PO TABS
40.0000 mg | ORAL_TABLET | Freq: Every day | ORAL | 0 refills | Status: DC
  Filled 2021-05-26: qty 30, 30d supply, fill #0

## 2021-05-26 MED ORDER — PANTOPRAZOLE SODIUM 40 MG PO TBEC
40.0000 mg | DELAYED_RELEASE_TABLET | Freq: Every day | ORAL | 0 refills | Status: DC
  Filled 2021-05-26: qty 30, 30d supply, fill #0

## 2021-05-26 MED ORDER — METFORMIN HCL 500 MG PO TABS
1000.0000 mg | ORAL_TABLET | Freq: Two times a day (BID) | ORAL | 0 refills | Status: DC
  Filled 2021-05-26: qty 120, 30d supply, fill #0

## 2021-05-26 MED ORDER — SERTRALINE HCL 100 MG PO TABS
100.0000 mg | ORAL_TABLET | Freq: Every day | ORAL | 0 refills | Status: DC
  Filled 2021-05-26 – 2021-07-03 (×2): qty 30, 30d supply, fill #0

## 2021-05-26 MED ORDER — LOSARTAN POTASSIUM 50 MG PO TABS
50.0000 mg | ORAL_TABLET | Freq: Two times a day (BID) | ORAL | 0 refills | Status: DC
  Filled 2021-05-26: qty 60, 30d supply, fill #0

## 2021-05-26 MED ORDER — CLOPIDOGREL BISULFATE 75 MG PO TABS
75.0000 mg | ORAL_TABLET | Freq: Every day | ORAL | 0 refills | Status: DC
  Filled 2021-05-26: qty 30, 30d supply, fill #0

## 2021-05-26 MED ORDER — SENNOSIDES-DOCUSATE SODIUM 8.6-50 MG PO TABS: 2.0000 | ORAL_TABLET | Freq: Every day | ORAL | Status: DC

## 2021-05-26 MED ORDER — AMLODIPINE BESYLATE 10 MG PO TABS
10.0000 mg | ORAL_TABLET | Freq: Every day | ORAL | 0 refills | Status: DC
  Filled 2021-05-26: qty 30, 30d supply, fill #0

## 2021-05-26 MED ORDER — INSULIN ASPART 100 UNIT/ML IJ SOLN
2.0000 [IU] | Freq: Three times a day (TID) | INTRAMUSCULAR | Status: DC
  Administered 2021-05-27: 2 [IU] via SUBCUTANEOUS

## 2021-05-26 MED ORDER — ONDANSETRON HCL 4 MG PO TABS
4.0000 mg | ORAL_TABLET | Freq: Three times a day (TID) | ORAL | Status: DC | PRN
  Administered 2021-05-26: 4 mg via ORAL
  Filled 2021-05-26 (×2): qty 1

## 2021-05-26 MED ORDER — SACCHAROMYCES BOULARDII 250 MG PO CAPS
250.0000 mg | ORAL_CAPSULE | Freq: Two times a day (BID) | ORAL | 8 refills | Status: DC
  Filled 2021-05-26: qty 28, 14d supply, fill #0

## 2021-05-26 MED ORDER — METHOCARBAMOL 500 MG PO TABS
500.0000 mg | ORAL_TABLET | Freq: Two times a day (BID) | ORAL | 0 refills | Status: DC | PRN
  Filled 2021-05-26: qty 60, 30d supply, fill #0

## 2021-05-26 MED ORDER — DICLOFENAC SODIUM 1 % EX GEL
2.0000 g | Freq: Four times a day (QID) | CUTANEOUS | 0 refills | Status: DC | PRN
  Filled 2021-05-26: qty 100, 12d supply, fill #0

## 2021-05-26 MED ORDER — INSULIN GLARGINE-YFGN 100 UNIT/ML ~~LOC~~ SOPN
10.0000 [IU] | PEN_INJECTOR | Freq: Two times a day (BID) | SUBCUTANEOUS | 1 refills | Status: DC
  Filled 2021-05-26: qty 15, 75d supply, fill #0
  Filled 2021-06-30 – 2021-07-03 (×2): qty 15, 75d supply, fill #1

## 2021-05-26 MED ORDER — GABAPENTIN 100 MG PO CAPS
100.0000 mg | ORAL_CAPSULE | Freq: Every day | ORAL | 0 refills | Status: DC
  Filled 2021-05-26: qty 30, 30d supply, fill #0

## 2021-05-26 MED ORDER — HYDRALAZINE HCL 10 MG PO TABS
10.0000 mg | ORAL_TABLET | Freq: Every day | ORAL | 0 refills | Status: DC
  Filled 2021-05-26: qty 30, 30d supply, fill #0

## 2021-05-26 NOTE — Progress Notes (Signed)
Patient ID: [name], female   DOB: January 24, 1956, 65 y.o.   MRN: [id number] Met with the patient regarding pending discharge and review readiness for discharge. Patient to be discharged on insulin and not familiar with insulin administration. Review insulin administration using a pen with the patient and hypoglycemia symptoms and treatment. Patient's usual reaction is to drink OJ; discussed other options that would allow better correction of the blood sugar and sustain the elevation longer than juice. Patient also given information on insulin injection and medications for home to use as a reference. Stated an understanding of the use of the pen. Needs review of the process with the daughter as well before discharge. Margarito Liner, RN

## 2021-05-26 NOTE — Progress Notes (Signed)
Occupational Therapy Session Note  Patient Details  Name: TRICA USERY MRN: [id number] Date of Birth: 1956/06/24  Today's Date: 05/26/2021 OT Individual Time: 1432-1500 OT Individual Time Calculation (min): 28 min   Short Term Goals: Week 3:  OT Short Term Goal 1 (Week 3): continuation of LTGs as pt's LOS extended.  Skilled Therapeutic Interventions/Progress Updates:    Pt greeted in bed, ADL needs met, not expecting therapist but agreeable to participate in short tx. While pt sat EOB, worked on bilateral coordination/Lt NMR via Radiographer, therapeutic. Pt folded kitchen towels, wash cloths, and pillowcases x5 reps each. Vcs for increasing visual attendance to the Lt hand and also for not overcompensating with the Rt. Pt visibly challenged by folding tasks, required increased time to meet task demands, especially with the pillowcases. She returned to bed at close of session, k-pad applied to the Rt calf for pain mgt. All needs within reach and bed alarm set.  Therapy Documentation Precautions:   Vital Signs: Therapy Vitals Temp: 98 F (36.7 C) Temp Source: Oral Pulse Rate: 90 Resp: 17 BP: 119/73 Patient Position (if appropriate): Lying Oxygen Therapy SpO2: 97 % O2 Device: Room Air Pain: Pain Assessment Pain Scale: Faces Faces Pain Scale: No hurt ADL: ADL Eating: Independent Grooming: Modified independent Where Assessed-Grooming: Sitting at sink Upper Body Bathing: Supervision/safety Where Assessed-Upper Body Bathing: Shower Lower Body Bathing: Contact guard Where Assessed-Lower Body Bathing: Shower Upper Body Dressing: Setup Where Assessed-Upper Body Dressing: Chair Lower Body Dressing: Contact guard Where Assessed-Lower Body Dressing: Chair Toileting: Minimal assistance Where Assessed-Toileting: Glass blower/designer: Therapist, music Method: Counselling psychologist: Energy manager: Metallurgist Method:  Optometrist: Radio broadcast assistant, Energy manager: Curator Method: Heritage manager: Radio broadcast assistant, Grab bars     Therapy/Group: Individual Therapy  Elfrida Pixley A Miriya Cloer 05/26/2021, 3:48 PM

## 2021-05-26 NOTE — Progress Notes (Signed)
At 2056, prn voltaren gel applied. Continues to complain of pain to left calf. PRN melatonin and robaxin given at 2201. Assisted to BR using WC, reports LLE feeling to weak to ambulate. Does better when not using left hand on grab bar or wheelchair-tries to push using left hand and hand slides off of surface. Patrici Ranks A

## 2021-05-26 NOTE — Progress Notes (Signed)
Occupational Therapy Discharge Summary  Patient Details  Name: [name] MRN: [id number] Date of Birth: September 09, 1956  Patient has met 9 of 9 long term goals due to improved activity tolerance, improved balance, postural control, ability to compensate for deficits, functional use of  LEFT upper and LEFT lower extremity, improved attention, improved awareness, and improved coordination.  Patient to discharge at [facility] Assist level.  Patient's care partner is independent to provide the necessary physical and cognitive assistance at discharge.    Reasons goals not met: n/a  Recommendation:  Patient will benefit from ongoing skilled OT services in home health setting to continue to advance functional skills in the area of BADL and iADL.  Equipment: BSC  Reasons for discharge: treatment goals met  Patient/family agrees with progress made and goals achieved: Yes  OT Discharge Precautions/Restrictions  Precautions Precautions: Fall Precaution Comments: L hemi, L inattention    Vital Signs Therapy Vitals Temp: 98 F (36.7 C) Temp Source: Oral Pulse Rate: 90 Resp: 17 BP: 119/73 Patient Position (if appropriate): Lying Oxygen Therapy SpO2: 97 % O2 Device: Room Air ADL ADL Eating: Independent Grooming: Modified independent Where Assessed-Grooming: Sitting at sink Upper Body Bathing: Supervision/safety Where Assessed-Upper Body Bathing: Shower Lower Body Bathing: Contact guard Where Assessed-Lower Body Bathing: Shower Upper Body Dressing: Setup Where Assessed-Upper Body Dressing: Chair Lower Body Dressing: Contact guard Where Assessed-Lower Body Dressing: Chair Toileting: Minimal assistance Where Assessed-Toileting: Glass blower/designer: Therapist, music Method: Counselling psychologist: Energy manager: Metallurgist Method: Optometrist: Radio broadcast assistant, Physiological scientist: Curator Method: Heritage manager: Radio broadcast assistant, Grab bars Vision Baseline Vision/History: No visual deficits Patient Visual Report: No change from baseline Vision Assessment?: No apparent visual deficits Eye Alignment: Within Functional Limits Ocular Range of Motion: Within Functional Limits Tracking/Visual Pursuits: Able to track stimulus in all quads without difficulty Convergence: Within functional limits Visual Fields: No apparent deficits Perception  Perception: Impaired Inattention/Neglect: Does not attend to left side of body Praxis Praxis: Intact Cognition Overall Cognitive Status: Impaired/Different from baseline Arousal/Alertness: Awake/alert Orientation Level: Oriented X4 Attention: Focused;Sustained Focused Attention: Appears intact Sustained Attention: Impaired Sustained Attention Impairment: Verbal complex;Functional complex Memory: Appears intact Problem Solving: Appears intact Behaviors: Impulsive (mild) Safety/Judgment: Impaired Comments: decreased awareness and safety with mobility, contributed by L inattention Sensation Sensation Light Touch: Impaired by gross assessment Hot/Cold: Appears Intact Proprioception: Impaired by gross assessment Stereognosis: Impaired by gross assessment Additional Comments: Impaired kinesthesia and proprioception on L hemibody Coordination Gross Motor Movements are Fluid and Coordinated: No Fine Motor Movements are Fluid and Coordinated: No Coordination and Movement Description: Impacted by L inattention and L proprioception deficits Finger Nose Finger Test: Impacted by LUE weakness - able to complete with poor [facility] Heel Shin Test: Impacted by L hip weakness although no dyscoordination noted Motor  Motor Motor: Hemiplegia Motor - Discharge Observations: L hemi - significant improvement since date of evaluation Mobility  Bed Mobility Bed Mobility:  Rolling Right;Rolling Left;Supine to Sit;Sit to Supine Rolling Right: Supervision/verbal cueing Rolling Left: Supervision/Verbal cueing Supine to Sit: Supervision/Verbal cueing Sit to Supine: Supervision/Verbal cueing Transfers Sit to Stand: Contact Guard/Touching assist Stand to Sit: Contact Guard/Touching assist  Trunk/Postural Assessment  Cervical Assessment Cervical Assessment: Exceptions to [facility] (forward head) Thoracic Assessment Thoracic Assessment: Exceptions to [facility] (thoracic kyphosis) Lumbar Assessment Lumbar Assessment: Exceptions to [facility] (post pelvic tilt) Postural Control Postural Control: Deficits on evaluation Trunk  Control: limited due to weakness and decreased awareness  Balance Balance Balance Assessed: Yes Static Sitting Balance Static Sitting - Balance Support: Feet supported Static Sitting - Level of Assistance: 6: Modified independent (Device/Increase time) Dynamic Sitting Balance Dynamic Sitting - Balance Support: During functional activity;No upper extremity supported Dynamic Sitting - Level of Assistance: 5: Stand by assistance Static Standing Balance Static Standing - Balance Support: Bilateral upper extremity supported Static Standing - Level of Assistance: 5: Stand by assistance Dynamic Standing Balance Dynamic Standing - Balance Support: During functional activity;Bilateral upper extremity supported Dynamic Standing - Level of Assistance: 4: Min assist Extremity/Trunk Assessment RUE Assessment RUE Assessment: Within Functional Limits LUE Assessment Passive Range of Motion (PROM) Comments: WFL Active Range of Motion (AROM) Comments: sh flex 150 General Strength Comments: sh flex 3+/5, elbow flex 3+/5, grasp 4/5 (very limited functional use of arm due to limited sensation/proprioception)   [place] 05/26/2021, 1:22 PM

## 2021-05-26 NOTE — Discharge Summary (Signed)
Physical Therapy Discharge Summary  Patient Details  Name: [name] MRN: [id number] Date of Birth: May 24, 1956  Today's Date: 05/26/2021 PT Individual Time: 5284-1324 + 1015-1100 PT Individual Time Calculation (min): 72 min + 45 min   Patient has met 9 of 9 long term goals due to improved activity tolerance, improved balance, improved postural control, increased strength, ability to compensate for deficits, functional use of  left upper extremity and left lower extremity, improved attention, and improved awareness.  Patient to discharge at an ambulatory level [place].   Patient's care partner is independent to provide the necessary physical and cognitive assistance at discharge.  Reasons goals not met: n/a  Recommendation:  Patient will benefit from ongoing skilled PT services in home health setting to continue to advance safe functional mobility, address ongoing impairments in L hemibody weakness, L inattention, functional mobility such as bed mobility, transfers, and gait training, caregiver training in order to minimize fall risk.  Equipment: RW. Pt's family is obtaining a transport chair  on their own  Reasons for discharge: treatment goals met and discharge from hospital  Patient/family agrees with progress made and goals achieved: Yes  PT Discharge Precautions/Restrictions Precautions Precautions: Fall Precaution Comments: L hemi, L inattention Restrictions Weight Bearing Restrictions: No Vital Signs Therapy Vitals Temp: 98.2 F (36.8 C) Temp Source: Oral Pulse Rate: 84 Resp: 14 BP: (!) 141/85 Patient Position (if appropriate): Lying Oxygen Therapy SpO2: 95 % O2 Device: Room Air Pain Pain Assessment Pain Scale: 0-10 Pain Score: 5  Pain Type: Acute pain Pain Location: Leg (back of calf) Pain Orientation: Left Pain Descriptors / Indicators: Sore;Spasm Pain Onset: With Activity Pain Intervention(s): Repositioned;Ambulation/increased activity;Emotional  support;Other (Comment) (stretching) Vision/Perception  Vision - Assessment Eye Alignment: Within Functional Limits Ocular Range of Motion: Within Functional Limits Tracking/Visual Pursuits: Able to track stimulus in all quads without difficulty Convergence: Within functional limits Perception Perception: Impaired Inattention/Neglect: Does not attend to left side of body Praxis Praxis: Intact  Cognition Overall Cognitive Status: Impaired/Different from baseline Arousal/Alertness: Awake/alert Orientation Level: Oriented X4 Attention: Focused;Sustained Focused Attention: Appears intact Sustained Attention: Impaired Sustained Attention Impairment: Verbal complex;Functional complex Memory: Appears intact Problem Solving: Appears intact Behaviors: Impulsive (mild) Safety/Judgment: Impaired Comments: decreased awareness and safety with mobility, contributed by L inattention Sensation Sensation Light Touch: Impaired by gross assessment Hot/Cold: Appears Intact Proprioception: Impaired by gross assessment Stereognosis: Impaired by gross assessment Additional Comments: Impaired kinesthesia and proprioception on L hemibody Coordination Gross Motor Movements are Fluid and Coordinated: No Fine Motor Movements are Fluid and Coordinated: No Coordination and Movement Description: Impacted by L inattention and L proprioception deficits Finger Nose Finger Test: Impacted by LUE weakness - able to complete with poor [facility] Heel Shin Test: Impacted by L hip weakness although no dyscoordination noted Motor  Motor Motor: Hemiplegia Motor - Discharge Observations: L hemi - significant improvement since date of evaluation  Mobility Bed Mobility Bed Mobility: Rolling Right;Rolling Left;Supine to Sit;Sit to Supine Rolling Right: Supervision/verbal cueing Rolling Left: Supervision/Verbal cueing Supine to Sit: Supervision/Verbal cueing Sit to Supine: Supervision/Verbal cueing Transfers Transfers:  Sit to Stand;Stand to Sit;Squat Pivot Transfers;Stand Pivot Transfers Sit to Stand: Contact Guard/Touching assist Stand to Sit: Contact Guard/Touching assist Stand Pivot Transfers: Contact Guard/Touching assist Stand Pivot Transfer Details: Verbal cues for technique;Verbal cues for sequencing;Verbal cues for precautions/safety;Verbal cues for gait pattern;Verbal cues for safe use of DME/AE;Tactile cues for weight shifting;Tactile cues for sequencing;Tactile cues for initiation Squat Pivot Transfers: Contact Guard/Touching assist;Minimal Assistance - Patient > 75% (  inconsistent) Transfer (Assistive device): 1 person hand held assist Locomotion  Gait Ambulation: Yes Gait Assistance: Contact Guard/Touching assist;Minimal Assistance - Patient > 75% Gait Distance (Feet): 200 Feet Assistive device: Rolling walker (with LUE RW orthotic/splint) Gait Assistance Details: Tactile cues for weight shifting;Verbal cues for sequencing;Verbal cues for technique;Verbal cues for precautions/safety;Verbal cues for gait pattern;Verbal cues for safe use of DME/AE;Manual facilitation for weight shifting Gait Gait: Yes Gait Pattern: Impaired Gait Pattern: Step-to pattern;Decreased step length - left;Decreased stance time - left;Decreased hip/knee flexion - left;Decreased dorsiflexion - left;Decreased weight shift to left;Trunk flexed;Poor foot clearance - left Gait velocity: decreaseed Stairs / Additional Locomotion Stairs: Yes Stairs Assistance: Minimal Assistance - Patient > 75% Stair Management Technique: One rail Right (Deferred using 2 rails due to L inattention) Number of Stairs: 8 Height of Stairs: 6 Wheelchair Mobility Wheelchair Mobility: No (Unable to efficiently and safely propel herself in w/c due to LUE inattention/proprioception deficits)  Trunk/Postural Assessment  Cervical Assessment Cervical Assessment: Exceptions to [facility] (forward head) Thoracic Assessment Thoracic Assessment: Exceptions to  [facility] (thoracic kyphosis) Lumbar Assessment Lumbar Assessment: Exceptions to [facility] (post pelvic tilt) Postural Control Postural Control: Deficits on evaluation Trunk Control: limited due to weakness and decreased awareness  Balance Balance Balance Assessed: Yes Static Sitting Balance Static Sitting - Balance Support: Feet supported Static Sitting - Level of Assistance: 6: Modified independent (Device/Increase time) Dynamic Sitting Balance Dynamic Sitting - Balance Support: During functional activity;No upper extremity supported Dynamic Sitting - Level of Assistance: 5: Stand by assistance Static Standing Balance Static Standing - Balance Support: Bilateral upper extremity supported Static Standing - Level of Assistance: 5: Stand by assistance (CGA) Dynamic Standing Balance Dynamic Standing - Balance Support: During functional activity;Bilateral upper extremity supported Dynamic Standing - Level of Assistance: 4: Min assist Extremity Assessment   RLE Assessment RLE Assessment: Within Functional Limits LLE Assessment LLE Assessment: Exceptions to [facility] General Strength Comments: Ankle DF 4+/5, knee ext 4/5, knee flex 4-/5, hip flex 3-/5  Skilled Intervention:  1st session: Pt greeted supine in bed to start session and is agreeable to PT tx. Reports mild L hand and L calf pain - reports 0/10 resting pain, evolves to 5/10 with mobiltiy. Rest breaks and L calf stretching provided during rest breaks. Bed mobility completed with supervision - cues for LUE awareness. Able to don socks and tennis shoes with supervision while seated EOB although requires ++ time to complete. Stand<>pivot with CGA and no AD towards stronger R side from EOB to w/c. She was transported in w/c to main rehab gym where she completed additional stand<>pivot with minA and no AD towards paretic L side to the mat table. Completed 1x10 sit<>stand transfers with supervision and RW from lowered mat table height - continues to  require cues for L hand awareness. Gait training ~13f + ~1360f+ 17541fith CGA and RW. Cues for L heel-toe progressions, safety awareness, keeping body within walker frame, and paced activity - pt's balance during gait is impacted by distractibility and provided education on importance of task attenuation to reduce falls risk. Next, completed car transfer with CGA and RW - demonstrated good understanding of safety technique and ability to get BLE's in/out of the car. Completed session with Nustep where she tolerated 8 minutes at workload of 2 - needed instruction to use her RUE to control L knee alignment in neutral position. Pt returned to her room and end of session and completed stand<>pivot with CGA and use of bed rail to EOB. Bed  mobility completed with supervision but again she requires cues for L hemibody placement/awareness (she was laying on top of her L hand). Bed alarm activated and needs in reach at end of session.   2nd session: Pt received supine in bed and is agreeable to physical therapy. Pt reports w/c has been delivered to her room - spoke with CSW as it was planned for this to be provided by her son as she didn't want to pay extra for insurance to get w/c. CSW advised that she may be charged for this DME and this information was relayed to pt. She decided to leave the w/c and to not take it home with her to avoid costs. Pt also requesting to go downstairs to cafeteria to get food as she doesn't like hospital food that is delivered to her. Reached out to RN who approved this. Bed mobility completed with supervision and completed stand<>pivot transfer with CGA to w/c. Wheeled to main rehab gym to practice stair training. She required minA to navigate up/down 2x4 steps (seated rest) with 1 hand rail on R (avoid placing L hand on hand rail due to poor awareness). She continues to require continuous cueing for "up with good, down with bad" and frequently confuses b/w the correct foot despite  frequent education on this. Family ed has been performed and family is aware - reminded pt she will need to assist in directing care for safety at home. Wheeled patient downstairs to cafeteria however unfortunately cafeteria was closed (opens at 11am). Returned upstairs back to her room as she reports need to use the toilet. Ambulatory transfer with CGA and RW to 3-1 BSC over toilet where pt was continent of B & B, able to perform pericare without assist. She required minA for standing balance as she pulled pants over hips and then ambulated back to bed with CGA and RW. Bed mobiltiy with supervision and bed alarm on with needs in reach.    Amirrah Quigley P Yvett Rossel PT, DPT 05/26/2021, 7:58 AM

## 2021-05-26 NOTE — Progress Notes (Signed)
Speech Language Pathology Discharge Summary  Patient Details  Name: SAMUEL RITTENHOUSE MRN: [id number] Date of Birth: Apr 22, 1956  Today's Date: 05/26/2021 SLP Individual Time: 1345-1415 SLP Individual Time Calculation (min): 30 min   Skilled Therapeutic Interventions:  Discharge education completed with pt this session. Pt informed slp that medications have changed and was able to recall 3 medications that were changed. Pt demonstrated good awareness of remaining physical deficits and stated she has noticed a lot of improvement in additional week being here. Pt discharging home tomorrow.     Patient has met 3 of 3 long term goals.  Patient to discharge at overall Modified Independent level.  Reasons goals not met:   n/a   Clinical Impression/Discharge Summary:   Pt has met 3/3 LTGs this admission due to improved problem solving, memory, and awareness. She continues to require Supervision A for attention to left for transfers but is Mod I for complex cognitive tasks. Pt education completed. She will discharge to daughters home with supervision from daughter. No further SLP tx recommended at this time.   Care Partner:  Caregiver Able to Provide Assistance: Yes  Type of Caregiver Assistance: Physical  Recommendation:  None      Equipment: none needed   Reasons for discharge: Discharged from hospital   Patient/Family Agrees with Progress Made and Goals Achieved: Yes    Darrol Poke Jiya Kissinger 05/26/2021, 2:07 PM

## 2021-05-26 NOTE — Progress Notes (Signed)
Occupational Therapy Session Note  Patient Details  Name: [name] MRN: [id number] Date of Birth: 1956-07-29  Today's Date: 05/26/2021 OT Individual Time: 1100-1200 OT Individual Time Calculation (min): 60 min    Short Term Goals: Week 1:  OT Short Term Goal 1 (Week 1): pt will complete squat pivot transfers with mod A. OT Short Term Goal 1 - Progress (Week 1): Met OT Short Term Goal 2 (Week 1): Pt will be able to hold static stand with min A. OT Short Term Goal 2 - Progress (Week 1): Met OT Short Term Goal 3 (Week 1): Pt will don shirt with min A OT Short Term Goal 3 - Progress (Week 1): Met OT Short Term Goal 4 (Week 1): Pt will be able to don pants over feet with min A. OT Short Term Goal 4 - Progress (Week 1): Met OT Short Term Goal 5 (Week 1): Pt will be able to pull pants over hips with mod A OT Short Term Goal 5 - Progress (Week 1): Met Week 2:  OT Short Term Goal 1 (Week 2): STGs = LTGs OT Short Term Goal 1 - Progress (Week 2): Progressing toward goal Week 3:  OT Short Term Goal 1 (Week 3): continuation of LTGs as pt's LOS extended.     Skilled Therapeutic Interventions/Progress Updates:    Pt received in bed dressed and ready for the day. Pt had requested to get her lunch from the cafeteria today.  Pt had approval from MD to do so.  Pt taken to cafeteria via wc.  Pt looked at all of her options and made a few choices.  She was able to hold the food box on her lap and pay for her meal and even recalled to ask for an employee discount as she works for [employer].   Pt did need A to cut her hotdog into smaller pieces. She tends to eat fast and needed cues to take small bites and completely chew her food prior to next bite.  Pt tended to leave food on her L side of face and needed cues to cleanse face frequently. Pt a bit anxious today and needed cues to relax, slow down.  Pt stated she enjoyed her lunch.  Pt returned to her room and transferred back to bed. Bed alarm set  and all needs met.   Therapy Documentation Precautions:  Precautions Precautions: Fall Precaution Comments: L hemi, L inattention Restrictions Weight Bearing Restrictions: No   Vital Signs: Therapy Vitals Temp: 98.2 F (36.8 C) Temp Source: Oral Pulse Rate: 84 Resp: 14 BP: (!) 141/85 Patient Position (if appropriate): Lying Oxygen Therapy SpO2: 95 % O2 Device: Room Air Pain: Pain Assessment Pain Scale: 0-10 Pain Score: 5  Pain Type: Acute pain Pain Location: Leg (back of calf) Pain Orientation: Left Pain Descriptors / Indicators: Sore;Spasm Pain Onset: With Activity Pain Intervention(s): Repositioned;Ambulation/increased activity;Emotional support;Other (Comment) (stretching) ADL: ADL Eating: Independent Grooming: Modified independent Where Assessed-Grooming: Sitting at sink Upper Body Bathing: Supervision/safety Where Assessed-Upper Body Bathing: Shower Lower Body Bathing: Contact guard Where Assessed-Lower Body Bathing: Shower Upper Body Dressing: Setup Where Assessed-Upper Body Dressing: Chair Lower Body Dressing: Contact guard Where Assessed-Lower Body Dressing: Chair Toileting: Minimal assistance Where Assessed-Toileting: Glass blower/designer: Therapist, music Method: Counselling psychologist: Energy manager: Metallurgist Method: Optometrist: Radio broadcast assistant, Energy manager: Curator Method: Heritage manager: Radio broadcast assistant, Museum/gallery curator  bars  Therapy/Group: Individual Therapy  [place] 05/26/2021, 8:45 AM

## 2021-05-26 NOTE — Progress Notes (Signed)
[facility] PHYSICAL MEDICINE & REHABILITATION PROGRESS NOTE  Subjective/Complaints: Discussed getting ultrasound of LLE given pain and she is agreeable. Voltaren gel does help with her pain here and in her hand.  C/o nausea- agreeable to trying zofran.  Discussed that she feels nauseous if CBG<100- decrease Novolog to 2U   ROS:    Pt denies SOB, abd pain, CP, N/V/C/D, and vision changes. +left calf pain   Objective: Vital Signs: Blood pressure 119/73, pulse 90, temperature 98 F (36.7 C), temperature source Oral, resp. rate 17, SpO2 97 %. No results found. No results for input(s): WBC, HGB, HCT, PLT in the last 72 hours. No results for input(s): NA, K, CL, CO2, GLUCOSE, BUN, CREATININE, CALCIUM in the last 72 hours.   Intake/Output Summary (Last 24 hours) at 05/26/2021 1525 Last data filed at 05/26/2021 1500 Gross per 24 hour  Intake 1000 ml  Output --  Net 1000 ml        Physical Exam: BP 119/73 (BP Location: Right Arm)   Pulse 90   Temp 98 F (36.7 C) (Oral)   Resp 17   SpO2 97%   Gen: no distress, normal appearing HEENT: oral mucosa pink and moist, NCAT Cardio: Reg rate Chest: normal effort, normal rate of breathing Abd: soft, non-distended Ext: no edema Psych: pleasant, normal affect Neurological: alert- son in room;  Extremities: No clubbing, cyanosis, or edema- trigger point palpated in L gastroc/soleus- a palpable knot.  Skin: No evidence of breakdown, no evidence of rash Follows commands.  Left inattention, some improvement Fair insight and awareness.  Motor: LUE 4-4+/5 prox to distal with mild apraxia, improving LLE: 4--4/5 prox to distal, improving  Assessment/Plan: 1. Functional deficits which require 3+ hours per day of interdisciplinary therapy in a comprehensive inpatient rehab setting. Physiatrist is providing close team supervision and 24 hour management of active medical problems listed below. Physiatrist and rehab team continue to assess  barriers to discharge/monitor patient progress toward functional and medical goals   Care Tool:  Bathing    Body parts bathed by patient: Chest, Abdomen, Right upper leg, Left upper leg, Face, Left arm, Front perineal area, Buttocks, Right arm, Right lower leg, Left lower leg   Body parts bathed by helper: Right lower leg, Left lower leg     Bathing assist Assist Level: Contact Guard/Touching assist     Upper Body Dressing/Undressing Upper body dressing   What is the patient wearing?: Pull over shirt, Bra    Upper body assist Assist Level: Set up assist    Lower Body Dressing/Undressing Lower body dressing      What is the patient wearing?: Pants, Underwear/pull up     Lower body assist Assist for lower body dressing: Contact Guard/Touching assist     Toileting Toileting    Toileting assist Assist for toileting: Contact Guard/Touching assist     Transfers Chair/bed transfer  Transfers assist     Chair/bed transfer assist level: Contact Guard/Touching assist     Locomotion Ambulation   Ambulation assist      Assist level: Minimal Assistance - Patient > 75% Assistive device: Walker-rolling Max distance: 149ft   Walk 10 feet activity   Assist  Walk 10 feet activity did not occur: Safety/medical concerns  Assist level: Minimal Assistance - Patient > 75% Assistive device: Walker-rolling   Walk 50 feet activity   Assist Walk 50 feet with 2 turns activity did not occur: Safety/medical concerns  Assist level: Minimal Assistance - Patient > 75% Assistive  device: Walker-rolling    Walk 150 feet activity   Assist Walk 150 feet activity did not occur: Safety/medical concerns  Assist level: Minimal Assistance - Patient > 75% Assistive device: Walker-rolling    Walk 10 feet on uneven surface  activity   Assist Walk 10 feet on uneven surfaces activity did not occur: Safety/medical concerns   Assist level: Minimal Assistance - Patient >  75% Assistive device: Aeronautical engineer Will patient use wheelchair at discharge?: No   Wheelchair activity did not occur: N/A         Wheelchair 50 feet with 2 turns activity    Assist    Wheelchair 50 feet with 2 turns activity did not occur: N/A       Wheelchair 150 feet activity     Assist  Wheelchair 150 feet activity did not occur: N/A        Medical Problem List and Plan: 1.  Left side hemiparesis to right thalamic infarction.  Status post mechanical thrombectomy of right PCA P2 occlusion 04/19/2021.  Status post loop recorder  Continue PT, OT   WHO nightly 2.  Antithrombotics: -DVT/anticoagulation: SCDs  LLE doppler negative for DVT. Discussed with patient and she is reassured by this. Will repeat given pain has worsened since last [place] on 5/31.              -antiplatelet therapy: Aspirin 81 mg daily, Plavix 75 mg daily until cardiac surgery  3. Left arm/hand pain: Discussed could be from tendinosis-overuse of flexors in this arm/hand. Recommended to start with icing 15mg  TID and will proceed to voltaren gel if needed. She is agreeable. Discussed with therapy and nursing. Rest hand and arm between therapy sessions.   Tylenol as needed  Robaxin as needed started on 5/20, conitnue  Continue low dose gabapentin 100mg  qhs and voltaren gel 4. Mood: Zoloft 100 mg daily, melatonin as needed.  Provide emotional support             -antipsychotic agents: N/A 5. Neuropsych: This patient is capable of making decisions on her own behalf.  Discussed with Neuropsych - appreciate eval 6. Skin/Wound Care: Routine skin checks 7. Fluids/Electrolytes/Nutrition: Routine in and outs 8.  Diabetes mellitus with hyperglycemia.  Hemoglobin A1c 9.3.    Patient on Actos, Januvia 100 mg, Glucotrol XL 5 mg daily, metformin 1000 mg twice daily prior to admission.    Currently on NovoLog 3 units 3 times daily with meals  Glucophage at 500mg  bid, increased to 1000  BID on 5/20  Diabetic teaching  Levemir to 11 units BID on 5/30, increased to 12 BID on 6/2 CBG (last 3)  Recent Labs    05/25/21 2019 05/26/21 0610 05/26/21 1212  GLUCAP 103* 104* 73   6/11- wasn't on insulin at home- asked to restart home meds- will restart Glyburide and see if need to restart Actos and/or Jardiance/Januvia- said will not use insulin at home, unless has no choice. Decreased Levemir to 10 units BID.   6/12- BG's better today- can restart Actos,e tc to get her off insulin- hopefully before d/c.   6/13: Decrease Novolog to 2U.  9. Hypertension.  Cozaar 25 mg twice daily, increased to 50 on 5/24, changed to 75 BID on 5/27, decreased to 50 BID on 5/31  Norvasc 2.5 started on 6/1, changed to qhs on 6/3  Hydralizine 10 qhs started on 5/31, increased on 6/1 Vitals:   05/26/21 0538 05/26/21 1259  BP: Marland Kitchen)  141/85 119/73  Pulse: 84 90  Resp: 14 17  Temp: 98.2 F (36.8 C) 98 F (36.7 C)  SpO2: 95% 97%  will increase norvasc to 5mg , on 6/8, continue to monitor for effects , may need further adjustment in 1-2 days if no improvement  6/11- BP still a little elevated- will increase Norvasc to 10 mg daily.   6/12- just got increased norvasc this AM- will wait a few days before changing.  10.  Hyperlipidemia.  Lipitor 11.  Obesity.  BMI 33.19.  Dietary follow-up 12.  GERD.  Protonix 13. Constipation: scheduled senna-s, encourage liquids, fruits, vegetables             -dulcolax suppository/fleet enema prn  Adjust bowel meds as necessary 14. Cough: COVID negative             -appears to be mostly pharyngeal, upper airway             -Mucinex DM 15.  Hyponatremia  Sodium 131 on 6/3  Continue to monitor 16.  Leukocytosis: Resolved  Continue to monitor  Afebrile  Continue to monitor 17. Aortic valve mass: discussed finding with patient and plan for outpatient follow-up/removal.  18. Cough: since current regimen not helping, cough is very bothersome to her, imaging is negative  for infection, and dextromethorphan may be contributing to her fatigue, changed to Mucinex to 95mL TID guaifensin-codeine. Change to PRN 6/7 19. AKI: Resolved  Cr 0.80 on 6/3  Encourage fluids 20.  Hyperkalemia  Lokelma ordered x1. Discussed that K+ has normalized 6/4 21. Proteus UTI: bactrim and probiotic started 6/7- afeb 6/8 22. Restless leg syndrome  6/11- restart Requip- don't see a contraindication for this.  23. L calf pain  6/12- needs to have dry needling or trigger point injection- d/w pt and PT- to ask Suezanne Jacquet if can do dry needling.   LOS: 27 days A FACE TO FACE EVALUATION WAS PERFORMED  Clide Deutscher Elyanna Wallick 05/26/2021, 3:25 PM

## 2021-05-27 ENCOUNTER — Ambulatory Visit (HOSPITAL_COMMUNITY): Payer: 59

## 2021-05-27 LAB — GLUCOSE, CAPILLARY: Glucose-Capillary: 132 mg/dL — ABNORMAL HIGH (ref 70–99)

## 2021-05-27 NOTE — Progress Notes (Signed)
Pt needs reminding to call for help and cues to use walker. Pt complaint of right palm pain and right posterior shin pain. Voltaren applied pt resting at this time.

## 2021-05-27 NOTE — Progress Notes (Signed)
Pt denies pain this morning excited to be going home.

## 2021-05-28 ENCOUNTER — Other Ambulatory Visit: Payer: Self-pay | Admitting: *Deleted

## 2021-05-28 ENCOUNTER — Other Ambulatory Visit: Payer: Self-pay

## 2021-05-28 ENCOUNTER — Telehealth: Payer: Self-pay | Admitting: Physician Assistant

## 2021-05-28 ENCOUNTER — Encounter: Payer: Self-pay | Admitting: Physical Medicine and Rehabilitation

## 2021-05-28 ENCOUNTER — Telehealth: Payer: Self-pay

## 2021-05-28 DIAGNOSIS — I69354 Hemiplegia and hemiparesis following cerebral infarction affecting left non-dominant side: Secondary | ICD-10-CM | POA: Diagnosis not present

## 2021-05-28 DIAGNOSIS — K5901 Slow transit constipation: Secondary | ICD-10-CM | POA: Diagnosis not present

## 2021-05-28 DIAGNOSIS — E7800 Pure hypercholesterolemia, unspecified: Secondary | ICD-10-CM | POA: Diagnosis not present

## 2021-05-28 DIAGNOSIS — K219 Gastro-esophageal reflux disease without esophagitis: Secondary | ICD-10-CM | POA: Diagnosis not present

## 2021-05-28 DIAGNOSIS — F3341 Major depressive disorder, recurrent, in partial remission: Secondary | ICD-10-CM | POA: Diagnosis not present

## 2021-05-28 DIAGNOSIS — E1165 Type 2 diabetes mellitus with hyperglycemia: Secondary | ICD-10-CM | POA: Diagnosis not present

## 2021-05-28 DIAGNOSIS — F419 Anxiety disorder, unspecified: Secondary | ICD-10-CM | POA: Diagnosis not present

## 2021-05-28 DIAGNOSIS — E669 Obesity, unspecified: Secondary | ICD-10-CM | POA: Diagnosis not present

## 2021-05-28 DIAGNOSIS — I10 Essential (primary) hypertension: Secondary | ICD-10-CM | POA: Diagnosis not present

## 2021-05-28 MED ORDER — UNIFINE PENTIPS 31G X 6 MM MISC
0 refills | Status: DC
  Filled 2021-05-28: qty 100, 50d supply, fill #0

## 2021-05-28 NOTE — Telephone Encounter (Signed)
Copied from [place] [id number] [id number]. Topic: Quick Communication - Home Health Verbal Orders >> May 28, 2021  3:07 PM Yvette Rack wrote: Caller/Agency: Tanvi with [facility] Callback Number: [phone] with secure voicemail Requesting OT/PT/Skilled Nursing/Social Work/Speech Therapy: PT Frequency: 1 time a week for 9 weeks

## 2021-05-28 NOTE — Patient Outreach (Signed)
[facility]) Care Management  05/28/2021  [name] June 04, 1956 [id number]   Transition of care call  Referral received:05/27/21 Discharge from inpatient rehab Initial outreach:05/28/21 Insurance: [place]    Subjective: Initial successful telephone call to patient's daugther Shealyn Sean identifed caregiver, DPR,preferred number in order to complete transition of care assessment; 2 HIPAA identifiers verified. Explained purpose of call . Erasmo Downer states that she is currently at working until closing on tonight , patient is home with another family/friend member . She states home heatlh has visited on today,Kristin states that she is unable to talk at this time but will let patient know  that I will call so that she will answer the phone.  Placed call to patient, no answer able to leave a HIPAA compliant voicemail message for return call.     Objective:  Quinisha Mould  hospitalized at [facility] 5/7-5/17/22 for Right Thalamic Stroke,left side weakness  s/p thrombectomy and loop recorder and new findind of Aortic valve mass. She was admitted to Inpatient rehab 5/17-6/14/22. Comorbidities include: Hypertension, Diabetes type 2, A1c 9.3% on 04/19/21, Hyperlipidemia.  She  was discharged to home with her daughter on 05/27/21 with [facility] home health , PT/OT/ST with DME equipment of Rolling walker, 3 in 1.    Plan:  Will route Unsuccessful outreach letter with [place] Management pamphlet and 24 Hour Nurse Line Magnet to [place] Management clinical pool to be mailed to patient's home address.  Will plan return call on next business day as daughter agreeable.    Joylene Draft, RN, BSN  [place] Management Coordinator  [phone]- Mobile [phone]- Toll Free Main Office

## 2021-05-28 NOTE — Telephone Encounter (Signed)
Copied from [place] [id number]. Topic: Quick Communication - Home Health Verbal Orders >> May 28, 2021  3:12 PM Yvette Rack wrote: Caller/Agency: Tanvi with [facility] Callback Number: [phone] with secure voicemail Requesting OT/PT/Skilled Nursing/Social Work/Speech Therapy: PT Frequency: 1 time a week for 9 weeks

## 2021-05-29 ENCOUNTER — Other Ambulatory Visit: Payer: Self-pay | Admitting: *Deleted

## 2021-05-29 ENCOUNTER — Telehealth: Payer: Self-pay

## 2021-05-29 NOTE — Telephone Encounter (Signed)
Advised [name] as below.

## 2021-05-29 NOTE — Telephone Encounter (Signed)
Transitional Care call--Kristen-daughter of patient    Are you/is patient experiencing any problems since coming home? No Are there any questions regarding any aspect of care? No Are there any questions regarding medications administration/dosing? No Are meds being taken as prescribed? Yes Patient should review meds with caller to confirm Have there been any falls? No Has Home Health been to the house and/or have they contacted you? Yes If not, have you tried to contact them? Can we help you contact them? Are bowels and bladder emptying properly? Yes Are there any unexpected incontinence issues? No If applicable, is patient following bowel/bladder programs? Any fevers, problems with breathing, unexpected pain? No Are there any skin problems or new areas of breakdown? No Has the patient/family member arranged specialty MD follow up (ie cardiology/neurology/renal/surgical/etc)? Yes  Can we help arrange? Does the patient need any other services or support that we can help arrange? No Are caregivers following through as expected in assisting the patient? Yes Has the patient quit smoking, drinking alcohol, or using drugs as recommended? No  Appointment time 11:20 am, arrive time 10:50 am with Dr. Ranell Patrick on 08/05/21 [place] [address]

## 2021-05-29 NOTE — Telephone Encounter (Signed)
Ok for orders? 

## 2021-05-29 NOTE — Patient Outreach (Signed)
[facility]) Care Management  05/29/2021  TROY HARTZOG 1956/02/19 [id number]  Transition of care call Referral received: 05/27/21 Initial outreach attempt:05/28/21 Insurance: [place]    2nd unsuccessful telephone call to patient's daughter Hamsini Verrilli at agreed time in order to complete post hospital discharge transition of care assessment , no answer mailbox is full unable leave a message.     Objective: Maybree Riling  hospitalized at [facility] 5/7-5/17/22 for Right Thalamic Stroke,left side weakness  s/p thrombectomy and loop recorder and new findind of Aortic valve mass. She was admitted to Inpatient rehab 5/17-6/14/22. Comorbidities include: Hypertension, Diabetes type 2, A1c 9.3% on 04/19/21, Hyperlipidemia. She  was discharged to home with her daughter on 05/27/21 with [facility] home health , PT/OT/ST with DME equipment of Rolling walker, 3 in 1.   Plan If no return call from patient will attempt return call in the 3 to 4 business days.    Joylene Draft, RN, BSN  [place] Management Coordinator  [phone]- Mobile [phone]- Toll Free Main Office

## 2021-05-29 NOTE — Telephone Encounter (Signed)
There was another message sent to provider regarding this.

## 2021-06-03 ENCOUNTER — Other Ambulatory Visit: Payer: Self-pay | Admitting: *Deleted

## 2021-06-03 DIAGNOSIS — F3341 Major depressive disorder, recurrent, in partial remission: Secondary | ICD-10-CM

## 2021-06-03 DIAGNOSIS — I69354 Hemiplegia and hemiparesis following cerebral infarction affecting left non-dominant side: Secondary | ICD-10-CM | POA: Diagnosis not present

## 2021-06-03 DIAGNOSIS — E669 Obesity, unspecified: Secondary | ICD-10-CM

## 2021-06-03 DIAGNOSIS — K5901 Slow transit constipation: Secondary | ICD-10-CM | POA: Diagnosis not present

## 2021-06-03 DIAGNOSIS — I10 Essential (primary) hypertension: Secondary | ICD-10-CM | POA: Diagnosis not present

## 2021-06-03 DIAGNOSIS — K219 Gastro-esophageal reflux disease without esophagitis: Secondary | ICD-10-CM | POA: Diagnosis not present

## 2021-06-03 DIAGNOSIS — F419 Anxiety disorder, unspecified: Secondary | ICD-10-CM | POA: Diagnosis not present

## 2021-06-03 DIAGNOSIS — E7800 Pure hypercholesterolemia, unspecified: Secondary | ICD-10-CM | POA: Diagnosis not present

## 2021-06-03 DIAGNOSIS — E1165 Type 2 diabetes mellitus with hyperglycemia: Secondary | ICD-10-CM | POA: Diagnosis not present

## 2021-06-03 DIAGNOSIS — M545 Low back pain, unspecified: Secondary | ICD-10-CM

## 2021-06-03 NOTE — Patient Outreach (Signed)
[facility]) Care Management  06/03/2021  BRIASIA FLINDERS [birth year] [id number]   Transition of care call Referral received: 05/27/21 Initial outreach attempt:05/28/21 Insurance: [place]      Transition of care call Referral received: 05/27/21 Initial outreach attempt: 05/28/21 Insurance: [place] unsuccessful telephone call to patient's preferred contact , designated party Wanita Derenzo in order to complete post hospital discharge transition of care assessment; Erasmo Downer states that she is at work and request return call on tomorrow   Objective: Zayra Devito  hospitalized at [facility] 5/7-5/17/22 for Right Thalamic Stroke,left side weakness  s/p thrombectomy and loop recorder and new findind of Aortic valve mass. She was admitted to Inpatient rehab 5/17-6/14/22. Comorbidities include: Hypertension, Diabetes type 2, A1c 9.3% on 04/19/21, Hyperlipidemia. She  was discharged to home with her daughter on 05/27/21 with [facility] home health , PT/OT/ST with DME equipment of Rolling walker, 3 in 1.  Plan: Will reschedule call to patient daughter , Erasmo Downer as recommend on tomorrow am specific time frame.    Joylene Draft, RN, BSN  [place] Management Coordinator  [phone]- Mobile [phone]- Toll Free Main Office

## 2021-06-04 ENCOUNTER — Other Ambulatory Visit: Payer: Self-pay | Admitting: *Deleted

## 2021-06-04 NOTE — Patient Outreach (Signed)
[facility]) Care Management  06/04/2021  [name] [birth year] [id number]   Transition of care call Referral received: 05/27/21 Initial outreach attempt:05/28/21 Insurance: [place]   Call rescheduled from outreach attempt 6/22 per daughter request.  Third unsuccessful telephone call to patient's  daughter [name] requested call time preferred contact number in order to complete post hospital discharge transition of care assessment; no answer, unable to leave a message mailbox is full . Placed call to patient preferred mobile number no answer able to leave a HIPAA compliant voicemail message.  Objective: Per electronic medical record , [name]  hospitalized at [facility] 5/7-5/17/22 for Right Thalamic Stroke,left side weakness  s/p thrombectomy and loop recorder and new findind of Aortic valve mass. She was admitted to Inpatient rehab 5/17-6/14/22. Comorbidities include: Hypertension, Diabetes type 2, A1c 9.3% on 04/19/21, Hyperlipidemia. She  was discharged to home with her daughter on 05/27/21 with [facility] home health , PT/OT/ST with DME equipment of Rolling walker, 3 in 1.   Plan: If no return call from patient, will plan return call in the next 3 weeks.   Joylene Draft, RN, BSN  [place] Management Coordinator  [phone]- Mobile [phone]- Toll Free Main Office

## 2021-06-05 ENCOUNTER — Telehealth: Payer: Self-pay | Admitting: Family Medicine

## 2021-06-05 NOTE — Telephone Encounter (Signed)
Proceed with rescheduling evaluation by Home Health.

## 2021-06-05 NOTE — Telephone Encounter (Signed)
Home Health Verbal Orders - Caller/Agency: Hershal Coria, [place] Number: [phone] Requesting OT/PT/Skilled Nursing/Social Work/Speech Therapy: ST Frequency:   Supposed to Evaluate today, but the patient called and cancelled. [name] Genre needs order for eval moved to next Tuesday June 28th

## 2021-06-06 NOTE — Telephone Encounter (Signed)
Please give order to proceed with rescheduling evaluation.

## 2021-06-09 ENCOUNTER — Other Ambulatory Visit: Payer: Self-pay

## 2021-06-09 MED ORDER — CARESTART COVID-19 HOME TEST VI KIT
PACK | 0 refills | Status: DC
  Filled 2021-06-09: qty 2, 4d supply, fill #0

## 2021-06-09 NOTE — Telephone Encounter (Signed)
Left detailed message on vm, giving verbal okay.

## 2021-06-13 ENCOUNTER — Telehealth: Payer: Self-pay | Admitting: Family Medicine

## 2021-06-13 DIAGNOSIS — E7800 Pure hypercholesterolemia, unspecified: Secondary | ICD-10-CM | POA: Diagnosis not present

## 2021-06-13 DIAGNOSIS — F3341 Major depressive disorder, recurrent, in partial remission: Secondary | ICD-10-CM | POA: Diagnosis not present

## 2021-06-13 DIAGNOSIS — F419 Anxiety disorder, unspecified: Secondary | ICD-10-CM | POA: Diagnosis not present

## 2021-06-13 DIAGNOSIS — I69354 Hemiplegia and hemiparesis following cerebral infarction affecting left non-dominant side: Secondary | ICD-10-CM | POA: Diagnosis not present

## 2021-06-13 DIAGNOSIS — E669 Obesity, unspecified: Secondary | ICD-10-CM | POA: Diagnosis not present

## 2021-06-13 DIAGNOSIS — K219 Gastro-esophageal reflux disease without esophagitis: Secondary | ICD-10-CM | POA: Diagnosis not present

## 2021-06-13 DIAGNOSIS — K5901 Slow transit constipation: Secondary | ICD-10-CM | POA: Diagnosis not present

## 2021-06-13 DIAGNOSIS — I10 Essential (primary) hypertension: Secondary | ICD-10-CM | POA: Diagnosis not present

## 2021-06-13 DIAGNOSIS — E1165 Type 2 diabetes mellitus with hyperglycemia: Secondary | ICD-10-CM | POA: Diagnosis not present

## 2021-06-13 NOTE — Telephone Encounter (Signed)
Copied from [place] [id number]. Topic: Quick Communication - Home Health Verbal Orders >> Jun 13, 2021  1:52 PM Jodie Echevaria wrote: Caller/Agency: [place] Number: [phone] [facility] to [facility]  Requesting OT/PT/Skilled Nursing/Social Work/Speech Therapy: OT  Frequency: 1 w 2, 2 w 4.

## 2021-06-13 NOTE — Telephone Encounter (Signed)
That's fine

## 2021-06-17 DIAGNOSIS — F419 Anxiety disorder, unspecified: Secondary | ICD-10-CM | POA: Diagnosis not present

## 2021-06-17 DIAGNOSIS — F3341 Major depressive disorder, recurrent, in partial remission: Secondary | ICD-10-CM | POA: Diagnosis not present

## 2021-06-17 DIAGNOSIS — E7800 Pure hypercholesterolemia, unspecified: Secondary | ICD-10-CM | POA: Diagnosis not present

## 2021-06-17 DIAGNOSIS — K5901 Slow transit constipation: Secondary | ICD-10-CM | POA: Diagnosis not present

## 2021-06-17 DIAGNOSIS — E1165 Type 2 diabetes mellitus with hyperglycemia: Secondary | ICD-10-CM | POA: Diagnosis not present

## 2021-06-17 DIAGNOSIS — I69354 Hemiplegia and hemiparesis following cerebral infarction affecting left non-dominant side: Secondary | ICD-10-CM | POA: Diagnosis not present

## 2021-06-17 DIAGNOSIS — K219 Gastro-esophageal reflux disease without esophagitis: Secondary | ICD-10-CM | POA: Diagnosis not present

## 2021-06-17 DIAGNOSIS — E669 Obesity, unspecified: Secondary | ICD-10-CM | POA: Diagnosis not present

## 2021-06-17 DIAGNOSIS — I10 Essential (primary) hypertension: Secondary | ICD-10-CM | POA: Diagnosis not present

## 2021-06-17 NOTE — Telephone Encounter (Signed)
I tried returning Connie's call. Left message on voice message system giving "ok" for verbal order.

## 2021-06-18 DIAGNOSIS — I69354 Hemiplegia and hemiparesis following cerebral infarction affecting left non-dominant side: Secondary | ICD-10-CM | POA: Diagnosis not present

## 2021-06-18 DIAGNOSIS — E7800 Pure hypercholesterolemia, unspecified: Secondary | ICD-10-CM | POA: Diagnosis not present

## 2021-06-18 DIAGNOSIS — E1165 Type 2 diabetes mellitus with hyperglycemia: Secondary | ICD-10-CM | POA: Diagnosis not present

## 2021-06-18 DIAGNOSIS — F3341 Major depressive disorder, recurrent, in partial remission: Secondary | ICD-10-CM | POA: Diagnosis not present

## 2021-06-18 DIAGNOSIS — K5901 Slow transit constipation: Secondary | ICD-10-CM | POA: Diagnosis not present

## 2021-06-18 DIAGNOSIS — I10 Essential (primary) hypertension: Secondary | ICD-10-CM | POA: Diagnosis not present

## 2021-06-18 DIAGNOSIS — F419 Anxiety disorder, unspecified: Secondary | ICD-10-CM | POA: Diagnosis not present

## 2021-06-18 DIAGNOSIS — K219 Gastro-esophageal reflux disease without esophagitis: Secondary | ICD-10-CM | POA: Diagnosis not present

## 2021-06-18 DIAGNOSIS — E669 Obesity, unspecified: Secondary | ICD-10-CM | POA: Diagnosis not present

## 2021-06-20 ENCOUNTER — Other Ambulatory Visit (HOSPITAL_COMMUNITY): Payer: Self-pay

## 2021-06-20 ENCOUNTER — Other Ambulatory Visit: Payer: Self-pay

## 2021-06-20 ENCOUNTER — Encounter: Payer: Self-pay | Admitting: Family Medicine

## 2021-06-20 ENCOUNTER — Ambulatory Visit (INDEPENDENT_AMBULATORY_CARE_PROVIDER_SITE_OTHER): Payer: 59 | Admitting: Family Medicine

## 2021-06-20 VITALS — BP 111/78 | HR 99 | Wt 210.0 lb

## 2021-06-20 DIAGNOSIS — F419 Anxiety disorder, unspecified: Secondary | ICD-10-CM | POA: Diagnosis not present

## 2021-06-20 DIAGNOSIS — N182 Chronic kidney disease, stage 2 (mild): Secondary | ICD-10-CM | POA: Diagnosis not present

## 2021-06-20 DIAGNOSIS — E1122 Type 2 diabetes mellitus with diabetic chronic kidney disease: Secondary | ICD-10-CM | POA: Diagnosis not present

## 2021-06-20 DIAGNOSIS — I10 Essential (primary) hypertension: Secondary | ICD-10-CM | POA: Diagnosis not present

## 2021-06-20 DIAGNOSIS — I6930 Unspecified sequelae of cerebral infarction: Secondary | ICD-10-CM | POA: Diagnosis not present

## 2021-06-20 LAB — POCT GLYCOSYLATED HEMOGLOBIN (HGB A1C): Hemoglobin A1C: 7.1 % — AB (ref 4.0–5.6)

## 2021-06-20 MED ORDER — GABAPENTIN 300 MG PO CAPS
300.0000 mg | ORAL_CAPSULE | Freq: Every day | ORAL | 2 refills | Status: DC
  Filled 2021-06-20: qty 30, 30d supply, fill #0

## 2021-06-20 MED ORDER — INSULIN GLARGINE-YFGN 100 UNIT/ML ~~LOC~~ SOPN: 20.0000 [IU] | PEN_INJECTOR | Freq: Every evening | SUBCUTANEOUS | Status: DC

## 2021-06-20 MED ORDER — DICLOFENAC SODIUM 1 % EX GEL
2.0000 g | Freq: Four times a day (QID) | CUTANEOUS | 0 refills | Status: DC | PRN
  Filled 2021-06-20: qty 100, 12d supply, fill #0

## 2021-06-20 MED ORDER — FREESTYLE LITE TEST VI STRP
ORAL_STRIP | 12 refills | Status: DC
  Filled 2021-06-20: qty 100, 25d supply, fill #0
  Filled 2021-11-21: qty 100, 25d supply, fill #1

## 2021-06-20 MED ORDER — ALPRAZOLAM 0.5 MG PO TABS
ORAL_TABLET | Freq: Two times a day (BID) | ORAL | 5 refills | Status: DC | PRN
  Filled 2021-06-20: qty 30, 15d supply, fill #0
  Filled 2021-07-11: qty 30, 15d supply, fill #1
  Filled 2021-08-11: qty 30, 15d supply, fill #2
  Filled 2021-09-05: qty 30, 15d supply, fill #3

## 2021-06-20 NOTE — Progress Notes (Signed)
Established patient visit   Patient: [name]   DOB: 14-Jun-1956   65 y.o. Female  MRN: [id number] Visit Date: 06/20/2021  Today's healthcare provider: Lelon Huh, MD   No chief complaint on file.  Subjective    HPI  Transition into care: Patient was recently hospitalized [facility] with left sided weakness for right thalamic CVA. She was admitted on 04/19/2021 and discharged to PT on 04/29/2021. She was discharged home on 05/27/2021.She is doing relatively well. Still weak and having a lot of pain in left arm and leg when she applies pressure to them.   She was changed from simvastatin to atorvastatin. Discharged on clopidogrel and aspirin. For diabetes was changed from glipizide to glyburide, discontinued sitagliptan and pioglitazone and started on glargine insulin. Was also prescribed amlodipine and hydralazine for better blood pressure control.    Diabetes Mellitus Type II, Follow-up  Lab Results  Component Value Date   HGBA1C 9.3 (H) 04/19/2021   HGBA1C 9.9 (A) 03/20/2021   HGBA1C 7.9 (H) 09/20/2020   Wt Readings from Last 3 Encounters:  04/24/21 218 lb 4.1 oz (99 kg)  04/18/21 220 lb (99.8 kg)  03/20/21 219 lb (99.3 kg)   Last seen for diabetes 3 months ago (seen by Fenton Malling, PA-C).  Management since then includes continuing same medication. She reports excellent compliance with treatment. She is not having side effects.  Symptoms: Yes fatigue No foot ulcerations  No appetite changes No nausea  No paresthesia of the feet  No polydipsia  No polyuria No visual disturbances   Yes vomiting     Home blood sugar records: fasting range: low 100's  Episodes of hypoglycemia? No    Current insulin regiment: Semglee 33m twice a day Most Recent Eye Exam: Pt is due for an eye exam Current exercise: PT regularly Current diet habits: in general, a "healthy" diet    Pertinent Labs: Lab Results  Component Value Date   CHOL 195 04/19/2021   HDL 46  04/19/2021   LDLCALC 105 (H) 04/19/2021   TRIG 221 (H) 04/19/2021   CHOLHDL 4.2 04/19/2021   Lab Results  Component Value Date   NA 135 05/19/2021   K 4.3 05/19/2021   CREATININE 0.96 05/19/2021   GFRNONAA >60 05/19/2021   GFRAA 81 09/20/2020   GLUCOSE 182 (H) 05/19/2021     ---------------------------------------------------------------------------------------------------     Medications: Outpatient Medications Prior to Visit  Medication Sig   acetaminophen (TYLENOL) 325 MG tablet Take 2 tablets (650 mg total) by mouth every 4 (four) hours as needed for mild pain (or temp > 37.5 C (99.5 F)).   amLODipine (NORVASC) 10 MG tablet Take 1 tablet (10 mg total) by mouth at bedtime.   aspirin 81 MG chewable tablet Chew 1 tablet (81 mg total) by mouth daily.   atorvastatin (LIPITOR) 40 MG tablet Take 1 tablet (40 mg total) by mouth daily.   clopidogrel (PLAVIX) 75 MG tablet Take 1 tablet (75 mg total) by mouth daily with breakfast.   COVID-19 At Home Antigen Test (CARESTART COVID-19 HOME TEST) KIT use as directed   diclofenac Sodium (VOLTAREN) 1 % GEL Apply 2 g topically 4 (four) times daily as needed (pain).   gabapentin (NEURONTIN) 100 MG capsule Take 1 capsule (100 mg total) by mouth at bedtime.   glyBURIDE (DIABETA) 5 MG tablet Take 1 tablet (5 mg total) by mouth daily with breakfast.   hydrALAZINE (APRESOLINE) 10 MG tablet Take 1 tablet (10 mg  total) by mouth at bedtime.   Insulin Glargine-yfgn (SEMGLEE, YFGN,) 100 UNIT/ML SOPN Inject 10 Units into the skin 2 (two) times daily.   Insulin Pen Needle (UNIFINE PENTIPS) 31G X 6 MM MISC use with saxenda twice daily   losartan (COZAAR) 50 MG tablet Take 1 tablet (50 mg total) by mouth 2 (two) times daily.   metFORMIN (GLUCOPHAGE) 500 MG tablet Take 2 tablets (1,000 mg total) by mouth 2 (two) times daily with a meal.   methocarbamol (ROBAXIN) 500 MG tablet Take 1 tablet (500 mg total) by mouth 2 (two) times daily as needed for muscle spasms.    Multiple Vitamin (MULTIVITAMIN) tablet Take 1 tablet by mouth daily.   pantoprazole (PROTONIX) 40 MG tablet Take 1 tablet (40 mg total) by mouth at bedtime.   rOPINIRole (REQUIP) 0.5 MG tablet Take 1 tablet (0.5 mg total) by mouth at bedtime.   saccharomyces boulardii (FLORASTOR) 250 MG capsule Take 1 capsule (250 mg total) by mouth 2 (two) times daily.   senna-docusate (SENOKOT-S) 8.6-50 MG tablet Take 2 tablets by mouth at bedtime.   sertraline (ZOLOFT) 100 MG tablet Take 1 tablet (100 mg total) by mouth daily.   No facility-administered medications prior to visit.    Review of Systems  Constitutional: Negative.   Respiratory: Negative.    Cardiovascular: Negative.   Gastrointestinal:  Positive for vomiting. Negative for abdominal distention, abdominal pain, anal bleeding, blood in stool, constipation, diarrhea, nausea and rectal pain.  Endocrine: Positive for polydipsia. Negative for cold intolerance, heat intolerance, polyphagia and polyuria.  Musculoskeletal:  Positive for gait problem.  Skin:  Negative for wound.  Neurological:  Negative for dizziness, light-headedness and headaches.      Objective    BP 111/78 (BP Location: Right Arm, Patient Position: Sitting, Cuff Size: Large)   Pulse 99   Wt 210 lb (95.3 kg)   SpO2 98%   BMI 31.93 kg/m     Physical Exam   General: Appearance:    Mildly obese female in no acute distress  Eyes:    PERRL, conjunctiva/corneas clear, EOM's intact       Lungs:     Clear to auscultation bilaterally, respirations unlabored  Heart:    Normal heart rate. Normal rhythm. No murmurs, rubs, or gallops.   MS:   All extremities are intact.   Neurologic:   Awake, alert, oriented x 3. +3 muscle strength left arm and leg.        Results for orders placed or performed in visit on 06/20/21  POCT glycosylated hemoglobin (Hb A1C)  Result Value Ref Range   Hemoglobin A1C 7.1 (A) 4.0 - 5.6 %    Assessment & Plan     1. History of CVA with  residual deficit Steadily improving with physical therapy although still with significant weakness of left UE and LE. Continue high intensity therapy. Was on ECASA prior to CVA, will continue DUAT for now. BP much better controlled on amlodipine and hs hydralazine. Has upcoming appt with cardiology.   She is having some neuropathic pain, will increase from 144m to gabapentin (NEURONTIN) 300 MG capsule; Take 1 capsule (300 mg total) by mouth at bedtime. For neuropathic pain  Dispense: 30 capsule; Refill: 2  2. Type 2 diabetes mellitus with stage 2 chronic kidney disease, without long-term current use of insulin ([place] Greatly improved since starting insuline at hospitalization. Now off of Januvia and pioglitazone. She wants to get off of insulin, but this is the best her  A1c has been in year. She can change from 10 Bid to 20 QD.    3. Essential hypertension Much better controlled as above. Continue current medications.    4. Acute anxiety Has long history of anxiety and panic attacks, but has been out of alprazolam which she reports she had only taken occasionally. Refilled today.  - ALPRAZolam (XANAX) 0.5 MG tablet; TAKE 1 TABLET BY MOUTH 2 TIMES DAILY AS NEEDED FOR ANXIETY.  Dispense: 30 tablet; Refill: 5  Refill.  - diclofenac Sodium (VOLTAREN) 1 % GEL; Apply 2 g topically 4 (four) times daily as needed (pain).  Dispense: 100 g; Refill: 0  The entirety of the information documented in the History of Present Illness, Review of Systems and Physical Exam were personally obtained by me. Portions of this information were initially documented by the CMA and reviewed by me for thoroughness and accuracy.     Lelon Huh, MD  [facility] [phone] (phone) [phone] (fax)  [place]

## 2021-06-20 NOTE — Patient Instructions (Signed)
Please review the attached list of medications and notify my office if there are any errors.   Change insulin from 10 units twice a day to 20 units once a day (in the evening)

## 2021-06-23 ENCOUNTER — Other Ambulatory Visit: Payer: Self-pay

## 2021-06-25 ENCOUNTER — Telehealth: Payer: Self-pay

## 2021-06-25 ENCOUNTER — Other Ambulatory Visit: Payer: Self-pay

## 2021-06-25 DIAGNOSIS — K219 Gastro-esophageal reflux disease without esophagitis: Secondary | ICD-10-CM | POA: Diagnosis not present

## 2021-06-25 DIAGNOSIS — K5901 Slow transit constipation: Secondary | ICD-10-CM | POA: Diagnosis not present

## 2021-06-25 DIAGNOSIS — F419 Anxiety disorder, unspecified: Secondary | ICD-10-CM | POA: Diagnosis not present

## 2021-06-25 DIAGNOSIS — F3341 Major depressive disorder, recurrent, in partial remission: Secondary | ICD-10-CM | POA: Diagnosis not present

## 2021-06-25 DIAGNOSIS — I69354 Hemiplegia and hemiparesis following cerebral infarction affecting left non-dominant side: Secondary | ICD-10-CM | POA: Diagnosis not present

## 2021-06-25 DIAGNOSIS — E7800 Pure hypercholesterolemia, unspecified: Secondary | ICD-10-CM | POA: Diagnosis not present

## 2021-06-25 DIAGNOSIS — I10 Essential (primary) hypertension: Secondary | ICD-10-CM | POA: Diagnosis not present

## 2021-06-25 DIAGNOSIS — E1165 Type 2 diabetes mellitus with hyperglycemia: Secondary | ICD-10-CM | POA: Diagnosis not present

## 2021-06-25 DIAGNOSIS — I639 Cerebral infarction, unspecified: Secondary | ICD-10-CM | POA: Diagnosis not present

## 2021-06-25 DIAGNOSIS — E669 Obesity, unspecified: Secondary | ICD-10-CM | POA: Diagnosis not present

## 2021-06-25 NOTE — Telephone Encounter (Signed)
Haven't seen it 

## 2021-06-25 NOTE — Telephone Encounter (Signed)
Have you seen paperwork on Ms. Coca?  Thanks,   -Mickel Baas

## 2021-06-25 NOTE — Telephone Encounter (Signed)
Copied from [place] [id number]. Topic: General - Other >> Jun 25, 2021  9:54 AM Tessa Lerner A wrote: Reason for CRM: Colletta Maryland with [facility] has called for an update on plan of care paperwork dated for 05/28/21  The paperwork was resubmitted to the practice via fax on 06/03/21 and 06/18/21  Please contact further

## 2021-06-25 NOTE — Telephone Encounter (Signed)
Hi [name],   Have you seen any paperwork for [name]?  Thanks,   -Mickel Baas

## 2021-06-26 ENCOUNTER — Other Ambulatory Visit: Payer: Self-pay | Admitting: *Deleted

## 2021-06-26 ENCOUNTER — Encounter: Payer: Self-pay | Admitting: *Deleted

## 2021-06-26 NOTE — Telephone Encounter (Signed)
We received POC on 06/03/21. It was faxed back to [facility] on 06/04/21. I faxed again this morning. Both times fax was successful on our end. -Nichole.

## 2021-06-26 NOTE — Patient Outreach (Addendum)
[facility]) Care Management  06/26/2021  [name] 05-11-1956 [id number]   Transition of care call/case closure   Referral received:05/27/21 Initial outreach:05/28/21 Insurance: [facility]    4th call attempt  Subjective: successful telephone call to patient's preferred number in order to complete transition of care assessment; 2 HIPAA identifiers verified. Explained purpose of call and completed transition of care assessment.  [name] states that she is doing so much better. She discussed her walking is good with use of walker. She discussed still having weakness in left arm/hand, difficutly even making a fist. She reports continued home health visit visit with [place] home health PT/OT. She reports  tolerating diet, denies bowel or bladder problems. She reports living at home with her daughter and family that are assisting with her  recovery. Patient reviews signs symptoms of stroke to seek medical attention for  Patient discussed upcoming appointment with cardiology to follow up on aortic mass and what the plan is for that.   Reviewed accessing the following [place] Benefits : She discussed any ongoing health issues, diabetes, hypertension and she is enrolled in Active health management with telephone health coach. She discussed being down sometimes at having stroke, discussed benefit of employee assistance counseling program and provided contact number . Provided patient contact number to benefits office for further questions on current benefits.  She says she does have the hospital indemnity plan provided contact [employer] to file a claim.    Objective: [name]  hospitalized at [facility] 5/7-5/17/22 for Right Thalamic Stroke,left side weakness  s/p thrombectomy and loop recorder and new finding of Aortic valve mass. She was admitted to Inpatient rehab 5/17-6/14/22. Comorbidities include: Hypertension, Diabetes type 2, A1c 9.3% on 04/19/21,( A1c  7.1% on 06/20/21) Hyperlipidemia. She  was discharged to home with her daughter on 05/27/21 with [facility] home health , PT/OT/ST with DME equipment of Rolling walker, 3 in 1.     Assessment:  Patient voices good understanding of all discharge instructions.  See transition of care flowsheet for assessment details.   Plan:  Reviewed hospital discharge diagnosis of Right Thalamic stroke, finding of Aortic valve mass   and discharge treatment plan using hospital discharge instructions, assessing medication adherence, reviewing problems requiring provider notification, and discussing the importance of follow up with surgeon, primary care provider and/or specialists as directed.  Reviewed [place] healthy lifestyle program information to receive discounted premium for  2023   Step 1: Get  your annual physical  Step 2: Complete your health assessment  Step 3:Identify your current health status and complete the corresponding action step between December 14, 2020 and August 14, 2021.    Using [place] website, verified that patient is an active participate in [place] Active Health Management chronic disease management program.    No ongoing care management needs identified so will close case to [place] Management services . Thanked patient for their services to [facility].  Joylene Draft, RN, BSN  [place] Management Coordinator  [phone]- Mobile [phone]- Toll Free Main Office

## 2021-06-27 DIAGNOSIS — K219 Gastro-esophageal reflux disease without esophagitis: Secondary | ICD-10-CM | POA: Diagnosis not present

## 2021-06-27 DIAGNOSIS — F3341 Major depressive disorder, recurrent, in partial remission: Secondary | ICD-10-CM | POA: Diagnosis not present

## 2021-06-27 DIAGNOSIS — I10 Essential (primary) hypertension: Secondary | ICD-10-CM | POA: Diagnosis not present

## 2021-06-27 DIAGNOSIS — E669 Obesity, unspecified: Secondary | ICD-10-CM | POA: Diagnosis not present

## 2021-06-27 DIAGNOSIS — K5901 Slow transit constipation: Secondary | ICD-10-CM | POA: Diagnosis not present

## 2021-06-27 DIAGNOSIS — E1165 Type 2 diabetes mellitus with hyperglycemia: Secondary | ICD-10-CM | POA: Diagnosis not present

## 2021-06-27 DIAGNOSIS — E7800 Pure hypercholesterolemia, unspecified: Secondary | ICD-10-CM | POA: Diagnosis not present

## 2021-06-27 DIAGNOSIS — F419 Anxiety disorder, unspecified: Secondary | ICD-10-CM | POA: Diagnosis not present

## 2021-06-27 DIAGNOSIS — I69354 Hemiplegia and hemiparesis following cerebral infarction affecting left non-dominant side: Secondary | ICD-10-CM | POA: Diagnosis not present

## 2021-06-29 ENCOUNTER — Telehealth: Payer: Self-pay

## 2021-06-29 NOTE — Progress Notes (Signed)
Cardiology Office Note:    Date:  06/29/2021  ID:  [name], DOB 24-Jan-1956, MRN [id number]  PCP:  Gwyneth Sprout, [facility] Providers Cardiologist:  Werner Lean, MD     I connected with  [name] on 06/30/21 by a video enabled telemedicine application and verified that I am speaking with the correct person using two identifiers.   I discussed the limitations of evaluation and management by telemedicine. The patient expressed understanding and agreed to proceed. Patient location Home Provider Location Home Telephone, 13 minutes   History of Present Illness:    [name] is a 65 y.o. female with a hx of recent stroke due to a embolization of a PFE, obstructive CAD but CT-FFR who presents after recovering from the stroke.  Seen virtually 06/30/21.  Patient notes that she is doing much better.  Has completed speech/PT/OT and denies residual deficits.  Feels occasional chest pressure but only when she things about the thing in her heart. No SOB/DOE and no PND/Orthopnea.  No weight gain or leg swelling.  No palpitations or syncope.  No chest pain.   Past Medical History:  Diagnosis Date   Anxiety    Arthritis    Cataract    left   Depression    Diabetes mellitus without complication ([place])    Environmental and seasonal allergies    GERD (gastroesophageal reflux disease)    Hyperlipidemia    Hypertension    Joint pain    as reported by patient   Right thalamic infarction ([place]) 04/29/2021   Urinary incontinence    as stated by patient    Past Surgical History:  Procedure Laterality Date   BREAST EXCISIONAL BIOPSY Right 1999   neg   BREAST LUMPECTOMY Right 2005   as reported by patient   BUBBLE STUDY  04/24/2021   Procedure: BUBBLE STUDY;  Surgeon: Sueanne Margarita, MD;  Location: [facility];  Service: Cardiovascular;;   CATARACT EXTRACTION  January 2013   CATARACT EXTRACTION W/PHACO Left 12/02/2016   Procedure: CATARACT EXTRACTION  PHACO AND INTRAOCULAR LENS PLACEMENT ([place]);  Surgeon: Estill Cotta, MD;  Location: [facility];  Service: Ophthalmology;  Laterality: Left;  [place] 2:14 AP% 28.4 CDE 59.73 Fluid pack lot # [id number]   DIAGNOSTIC LAPAROSCOPY     IR CT HEAD LTD  04/19/2021   IR PERCUTANEOUS ART THROMBECTOMY/INFUSION INTRACRANIAL INC DIAG ANGIO  04/19/2021       IR PERCUTANEOUS ART THROMBECTOMY/INFUSION INTRACRANIAL INC DIAG ANGIO  04/19/2021   IR US GUIDE VASC ACCESS LEFT  04/19/2021   RADIOLOGY WITH ANESTHESIA N/A 04/19/2021   Procedure: IR WITH ANESTHESIA;  Surgeon: Luanne Bras, MD;  Location: [facility];  Service: Radiology;  Laterality: N/A;   TEE WITHOUT CARDIOVERSION N/A 04/24/2021   Procedure: TRANSESOPHAGEAL ECHOCARDIOGRAM (TEE);  Surgeon: Sueanne Margarita, MD;  Location: [facility];  Service: Cardiovascular;  Laterality: N/A;   TOTAL HIP ARTHROPLASTY Right 04/04/2018   Procedure: TOTAL HIP ARTHROPLASTY;  Surgeon: Dereck Leep, MD;  Location: [facility];  Service: Orthopedics;  Laterality: Right;    Current Medications: No outpatient medications have been marked as taking for the 06/30/21 encounter (Appointment) with Werner Lean, MD.     Allergies:   Jardiance [empagliflozin] and Penicillins   Social History   Socioeconomic History   Marital status: Widowed    Spouse name: Not on file   Number of children: Not on file   Years of education: Not on  file   Highest education level: Not on file  Occupational History    Employer: [employer]  Tobacco Use   Smoking status: Former    Packs/day: 1.50    Years: 25.00    Pack years: 37.50    Types: Cigarettes    Quit date: 03/24/2007    Years since quitting: 14.2   Smokeless tobacco: Never  Vaping Use   Vaping Use: Never used  Substance and Sexual Activity   Alcohol use: Yes    Alcohol/week: 0.0 standard drinks    Comment: 1- every 3-4 months   Drug use: No   Sexual activity: Not on file  Other Topics Concern   Not on file  Social  History Narrative   Not on file   Social Determinants of Health   Financial Resource Strain: Not on file  Food Insecurity: Not on file  Transportation Needs: Not on file  Physical Activity: Not on file  Stress: Not on file  Social Connections: Not on file     Family History: The patient's family history includes Alzheimer's disease in her paternal grandmother; Aneurysm in her mother; Bone cancer in her brother; CAD in her father; Cancer in her father; Colon cancer in her father; Dementia in her paternal grandmother; Diabetes in her father; Healthy in her daughter and son; Heart failure in her father; Mental illness in her paternal grandfather. There is no history of Breast cancer.  ROS:   Please see the history of present illness.     EKGs/Labs/Other Studies Reviewed:     Transthoracic Echocardiogram: Date:04/19/21 Results:  1. Left ventricular ejection fraction, by estimation, is 70 to 75%. The  left ventricle has hyperdynamic function. The left ventricle has no  regional wall motion abnormalities. Left ventricular diastolic parameters  are consistent with Grade I diastolic  dysfunction (impaired relaxation). Elevated left atrial pressure.   2. Right ventricular systolic function is normal. The right ventricular  size is normal. Tricuspid regurgitation signal is inadequate for assessing  PA pressure.   3. Left atrial size was mildly dilated.   4. The mitral valve is normal in structure. Mild mitral valve  regurgitation. No evidence of mitral stenosis.   5. The aortic valve is normal in structure. Aortic valve regurgitation is  not visualized. No aortic stenosis is present.   6. The inferior vena cava is normal in size with greater than 50%  respiratory variability, suggesting right atrial pressure of 3 mmHg.   Transesophageal Echocardiogram: Date:04/24/21 Results:  1. Left ventricular ejection fraction, by estimation, is 60 to 65%. The  left ventricle has normal function.  The left ventricle has no regional  wall motion abnormalities.   2. Prominent papillary muscle is noted. Right ventricular systolic  function is normal. The right ventricular size is normal.   3. Left atrial size was mildly dilated. No left atrial/left atrial  appendage thrombus was detected.   4. The mitral valve is normal in structure. No evidence of mitral valve  regurgitation. No evidence of mitral stenosis.   5. The aortic valve is abnormal. Trileaflet aortic valve with large well  defined shimmering highly mobile mass on the aortic side of the valve. 3D  images obtained to try to delineate the origin of the mass as difficult to  discern whether this is  originating from the valve leaflet or the aortic wall. 3D images  suggestive of origin of mass off the aortic valve cusp and most consistent  with papillary fibroelastoma. There is no significant  AI. Aortic valve  regurgitation is not visualized. No aortic  stenosis is present.   6. The inferior vena cava is normal in size with greater than 50%  respiratory variability, suggesting right atrial pressure of 3 mmHg.   7. Agitated saline contrast bubble study was negative, with no evidence  of any interatrial shunt.   Cardiac CT : Date: 04/25/21 Results:   IMPRESSION: 1. Coronary calcium score of 1904. This was 99th percentile for age, sex, and race matched control.   2. Normal coronary origin with right dominance.   3. CAD-RADS 4 Severe stenosis. (70-99% or > 50% left main). CT FFR is recommended and will be sent. Consider symptom-guided anti-ischemic pharmacotherapy as well as risk factor modification per guideline directed care.   4.  Aortic atherosclerosis noted.   5. 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   6. Mild to moderate mitral annular calcification noted.    Recent Labs: 09/20/2020: TSH 3.500 04/25/2021: Magnesium  1.5 04/30/2021: ALT 20 05/12/2021: Hemoglobin 12.4; Platelets 284 05/19/2021: BUN 10; Creatinine, Ser 0.96; Potassium 4.3; Sodium 135  Recent Lipid Panel    Component Value Date/Time   CHOL 195 04/19/2021 0427   CHOL 233 (H) 09/20/2020 1132   CHOL 205 (H) 09/06/2013 1518   TRIG 221 (H) 04/19/2021 0427   TRIG 174 09/06/2013 1518   HDL 46 04/19/2021 0427   HDL 47 09/20/2020 1132   HDL 53 09/06/2013 1518   CHOLHDL 4.2 04/19/2021 0427   VLDL 44 (H) 04/19/2021 0427   VLDL 35 09/06/2013 1518   LDLCALC 105 (H) 04/19/2021 0427   LDLCALC 152 (H) 09/20/2020 1132   LDLCALC 117 (H) 09/06/2013 1518      Physical Exam:    VS:  There were no vitals taken for this visit.    Wt Readings from Last 3 Encounters:  06/20/21 95.3 kg  04/24/21 99 kg  04/18/21 99.8 kg    Telephone visit; patient unable to check vitals at home  ASSESSMENT:    No diagnosis found. PLAN:    PFE CAD Recent embolic Surgery - Will send to CT surgery; patient is unclear of her medications; will schedule visit in person in one week.  Well get ECG at that time; reaching out CT surgery; may potentially need high risk LHC - would DC plavix (in discussion with patient she is not taking this medication)  Has f/u for next week  Medication Adjustments/Labs and Tests Ordered: Current medicines are reviewed at length with the patient today.  Concerns regarding medicines are outlined above.  No orders of the defined types were placed in this encounter.  No orders of the defined types were placed in this encounter.   There are no Patient Instructions on file for this visit.   Signed, Werner Lean, MD  06/29/2021 3:52 PM    [facility]

## 2021-06-29 NOTE — Telephone Encounter (Signed)
  Patient Consent for Virtual Visit        [name] has provided verbal consent on 06/29/2021 for a virtual visit (video or telephone).   CONSENT FOR VIRTUAL VISIT FOR:  [name]  By participating in this virtual visit I agree to the following:  I hereby voluntarily request, consent and authorize [facility] and its employed or contracted physicians, physician assistants, nurse practitioners or other licensed health care professionals (the Practitioner), to provide me with telemedicine health care services (the "Services") as deemed necessary by the treating Practitioner. I acknowledge and consent to receive the Services by the Practitioner via telemedicine. I understand that the telemedicine visit will involve communicating with the Practitioner through live audiovisual communication technology and the disclosure of certain medical information by electronic transmission. I acknowledge that I have been given the opportunity to request an in-person assessment or other available alternative prior to the telemedicine visit and am voluntarily participating in the telemedicine visit.  I understand that I have the right to withhold or withdraw my consent to the use of telemedicine in the course of my care at any time, without affecting my right to future care or treatment, and that the Practitioner or I may terminate the telemedicine visit at any time. I understand that I have the right to inspect all information obtained and/or recorded in the course of the telemedicine visit and may receive copies of available information for a reasonable fee.  I understand that some of the potential risks of receiving the Services via telemedicine include:  Delay or interruption in medical evaluation due to technological equipment failure or disruption; Information transmitted may not be sufficient (e.g. poor resolution of images) to allow for appropriate medical decision making by the Practitioner;  and/or  In rare instances, security protocols could fail, causing a breach of personal health information.  Furthermore, I acknowledge that it is my responsibility to provide information about my medical history, conditions and care that is complete and accurate to the best of my ability. I acknowledge that Practitioner's advice, recommendations, and/or decision may be based on factors not within their control, such as incomplete or inaccurate data provided by me or distortions of diagnostic images or specimens that may result from electronic transmissions. I understand that the practice of medicine is not an exact science and that Practitioner makes no warranties or guarantees regarding treatment outcomes. I acknowledge that a copy of this consent can be made available to me via my patient portal ([facility]), or I can request a printed copy by calling the office of [facility].    I understand that my insurance will be billed for this visit.   I have read or had this consent read to me. I understand the contents of this consent, which adequately explains the benefits and risks of the Services being provided via telemedicine.  I have been provided ample opportunity to ask questions regarding this consent and the Services and have had my questions answered to my satisfaction. I give my informed consent for the services to be provided through the use of telemedicine in my medical care

## 2021-06-30 ENCOUNTER — Other Ambulatory Visit: Payer: Self-pay

## 2021-06-30 ENCOUNTER — Telehealth (INDEPENDENT_AMBULATORY_CARE_PROVIDER_SITE_OTHER): Payer: 59 | Admitting: Internal Medicine

## 2021-06-30 DIAGNOSIS — D151 Benign neoplasm of heart: Secondary | ICD-10-CM | POA: Diagnosis not present

## 2021-06-30 NOTE — Patient Instructions (Signed)
Medication Instructions:  Your physician has recommended you make the following change in your medication:  STOP: clopidogrel (Plavix)   *If you need a refill on your cardiac medications before your next appointment, please call your pharmacy*   Lab Work: NONE If you have labs (blood work) drawn today and your tests are completely normal, you will receive your results only by: [facility] (if you have MyChart) OR A paper copy in the mail If you have any lab test that is abnormal or we need to change your treatment, we will call you to review the results.   Testing/Procedures: Your physician has referred you to see a Cardiothoracic Surgeon.    Follow-Up: At [facility], you and your health needs are our priority.  As part of our continuing mission to provide you with exceptional heart care, we have created designated Provider Care Teams.  These Care Teams include your primary Cardiologist (physician) and Advanced Practice Providers (APPs -  Physician Assistants and Nurse Practitioners) who all work together to provide you with the care you need, when you need it.  Your next appointment:   Wednesday July 09, 2021 at 10 am    The format for your next appointment:   In Person  Provider:   You may see Werner Lean, MD

## 2021-07-02 ENCOUNTER — Ambulatory Visit: Payer: Self-pay | Admitting: *Deleted

## 2021-07-02 ENCOUNTER — Emergency Department
Admission: EM | Admit: 2021-07-02 | Discharge: 2021-07-02 | Disposition: A | Discharge status: Home / Self Care | Payer: 59 | Attending: Emergency Medicine | Admitting: Emergency Medicine

## 2021-07-02 ENCOUNTER — Other Ambulatory Visit: Payer: Self-pay

## 2021-07-02 DIAGNOSIS — R42 Dizziness and giddiness: Secondary | ICD-10-CM | POA: Diagnosis not present

## 2021-07-02 DIAGNOSIS — Z794 Long term (current) use of insulin: Secondary | ICD-10-CM | POA: Diagnosis not present

## 2021-07-02 DIAGNOSIS — I10 Essential (primary) hypertension: Secondary | ICD-10-CM | POA: Insufficient documentation

## 2021-07-02 DIAGNOSIS — E1165 Type 2 diabetes mellitus with hyperglycemia: Secondary | ICD-10-CM | POA: Diagnosis not present

## 2021-07-02 DIAGNOSIS — K2970 Gastritis, unspecified, without bleeding: Secondary | ICD-10-CM | POA: Insufficient documentation

## 2021-07-02 DIAGNOSIS — Z7984 Long term (current) use of oral hypoglycemic drugs: Secondary | ICD-10-CM | POA: Insufficient documentation

## 2021-07-02 DIAGNOSIS — R739 Hyperglycemia, unspecified: Secondary | ICD-10-CM

## 2021-07-02 DIAGNOSIS — Z96641 Presence of right artificial hip joint: Secondary | ICD-10-CM | POA: Insufficient documentation

## 2021-07-02 DIAGNOSIS — Z79899 Other long term (current) drug therapy: Secondary | ICD-10-CM | POA: Insufficient documentation

## 2021-07-02 DIAGNOSIS — E860 Dehydration: Secondary | ICD-10-CM | POA: Diagnosis not present

## 2021-07-02 DIAGNOSIS — K2900 Acute gastritis without bleeding: Secondary | ICD-10-CM | POA: Diagnosis not present

## 2021-07-02 DIAGNOSIS — Z87891 Personal history of nicotine dependence: Secondary | ICD-10-CM | POA: Diagnosis not present

## 2021-07-02 DIAGNOSIS — Z7982 Long term (current) use of aspirin: Secondary | ICD-10-CM | POA: Insufficient documentation

## 2021-07-02 DIAGNOSIS — R1111 Vomiting without nausea: Secondary | ICD-10-CM | POA: Diagnosis not present

## 2021-07-02 DIAGNOSIS — R110 Nausea: Secondary | ICD-10-CM | POA: Diagnosis not present

## 2021-07-02 DIAGNOSIS — R112 Nausea with vomiting, unspecified: Secondary | ICD-10-CM | POA: Diagnosis not present

## 2021-07-02 LAB — CBG MONITORING, ED: Glucose-Capillary: 310 mg/dL — ABNORMAL HIGH (ref 70–99)

## 2021-07-02 LAB — BASIC METABOLIC PANEL
Anion gap: 16 — ABNORMAL HIGH (ref 5–15)
BUN: 11 mg/dL (ref 8–23)
CO2: 21 mmol/L — ABNORMAL LOW (ref 22–32)
Calcium: 9.6 mg/dL (ref 8.9–10.3)
Chloride: 97 mmol/L — ABNORMAL LOW (ref 98–111)
Creatinine, Ser: 0.95 mg/dL (ref 0.44–1.00)
GFR, Estimated: >60 mL/min (ref >60)
Glucose, Bld: 320 mg/dL — ABNORMAL HIGH (ref 70–99)
Potassium: 4 mmol/L (ref 3.5–5.1)
Sodium: 134 mmol/L — ABNORMAL LOW (ref 135–145)

## 2021-07-02 LAB — CBC
HCT: 40.7 % (ref 36.0–46.0)
Hemoglobin: 13.9 g/dL (ref 12.0–15.0)
MCH: 29 pg (ref 26.0–34.0)
MCHC: 34.2 g/dL (ref 30.0–36.0)
MCV: 84.8 fL (ref 80.0–100.0)
Platelets: 324 10*3/uL (ref 150–400)
RBC: 4.8 MIL/uL (ref 3.87–5.11)
RDW: 12.7 % (ref 11.5–15.5)
WBC: 10.2 10*3/uL (ref 4.0–10.5)
nRBC: 0 % (ref 0.0–0.2)

## 2021-07-02 LAB — HEPATIC FUNCTION PANEL
ALT: 12 U/L (ref 0–44)
AST: 22 U/L (ref 15–41)
Albumin: 4.1 g/dL (ref 3.5–5.0)
Alkaline Phosphatase: 53 U/L (ref 38–126)
Bilirubin, Direct: 0.2 mg/dL (ref 0.0–0.2)
Indirect Bilirubin: 0.6 mg/dL (ref 0.3–0.9)
Total Bilirubin: 0.8 mg/dL (ref 0.3–1.2)
Total Protein: 8.2 g/dL — ABNORMAL HIGH (ref 6.5–8.1)

## 2021-07-02 LAB — TROPONIN I (HIGH SENSITIVITY): Troponin I (High Sensitivity): 11 ng/L (ref <18)

## 2021-07-02 LAB — LIPASE, BLOOD: Lipase: 22 U/L (ref 11–51)

## 2021-07-02 MED ORDER — INSULIN ASPART 100 UNIT/ML IJ SOLN
0.0000 [IU] | INTRAMUSCULAR | Status: DC
  Administered 2021-07-02: 11 [IU] via SUBCUTANEOUS
  Filled 2021-07-02: qty 1

## 2021-07-02 MED ORDER — ONDANSETRON 4 MG PO TBDP
4.0000 mg | ORAL_TABLET | Freq: Once | ORAL | Status: AC
  Administered 2021-07-02: 4 mg via ORAL
  Filled 2021-07-02: qty 1

## 2021-07-02 MED ORDER — LACTATED RINGERS IV BOLUS
500.0000 mL | Freq: Once | INTRAVENOUS | Status: AC
  Administered 2021-07-02: 500 mL via INTRAVENOUS

## 2021-07-02 NOTE — ED Provider Notes (Signed)
[facility] Emergency Department Provider Note  ____________________________________________   Event Date/Time   First MD Initiated Contact with Patient 07/02/21 1714     (approximate)  I have reviewed the triage vital signs and the nursing notes.   HISTORY  Chief Complaint Hyperglycemia   HPI [name] is a 65 y.o. female with a hx of CVA with some residual uncoordination and paresthesias in LUE and LLE, CAD, anxiety, arthritis, depression, DM, GERD, HTN, HDL and chronic urinary incontinence who presents for assessment of an episode of dizziness described as vertigo experienced earlier this morning when she woke up associate with some nonbloody emesis vomiting and nausea.  Patient states she thinks was little dehydrated and as her sugars were quite high she had a donut and a bunch of carbs and other fried food last night.  States she currently cannot feel nauseous and denies any associated headache, earache, sore throat, chest pain, cough, shortness of breath, abdominal pain, diarrhea, dysuria, constipation, back pain or new weakness numbness or tingling in her extremities.  She denies any other acute concerns at this time.  No significant EtOH or illicit drug use.         Past Medical History:  Diagnosis Date   Anxiety    Arthritis    Cataract    left   Depression    Diabetes mellitus without complication ([place])    Environmental and seasonal allergies    GERD (gastroesophageal reflux disease)    Hyperlipidemia    Hypertension    Joint pain    as reported by patient   Right thalamic infarction ([place]) 04/29/2021   Urinary incontinence    as stated by patient    Patient Active Problem List   Diagnosis Date Noted   Hyperkalemia    Chronic bilateral low back pain without sciatica    Leukocytosis    Hyponatremia    Essential hypertension    Slow transit constipation    Hemiparesis affecting left side as late effect of stroke ([place])    Aortic  valve mass    History of CVA with residual deficit 04/19/2021   Ischemic stroke ([place]) 04/18/2021   Dyslipidemia, goal LDL below 70 04/18/2021   AKI (acute kidney injury) ([place]) 04/18/2021   Hypertensive urgency 04/18/2021   Recurrent major depressive disorder, in partial remission ([place]) 09/20/2020   Status post total replacement of hip 04/04/2018   Primary osteoarthritis of right hip 02/20/2018   Allergic rhinitis 10/28/2015   Acute stress disorder 08/01/2015   Anxiety 08/01/2015   Clinical depression 08/01/2015   Diabetes ([place]) 08/01/2015   Diverticulosis of colon 08/01/2015   Generalized pruritus 08/01/2015   BP (high blood pressure) 08/01/2015   Cannot sleep 08/01/2015   Adiposity 08/01/2015   Detrusor muscle hypertonia 08/01/2015   Hypercholesterolemia without hypertriglyceridemia 08/01/2015   Female stress incontinence 08/01/2015   Avitaminosis D 08/01/2015    Past Surgical History:  Procedure Laterality Date   BREAST EXCISIONAL BIOPSY Right 1999   neg   BREAST LUMPECTOMY Right 2005   as reported by patient   BUBBLE STUDY  04/24/2021   Procedure: BUBBLE STUDY;  Surgeon: Sueanne Margarita, MD;  Location: [facility];  Service: Cardiovascular;;   CATARACT EXTRACTION  January 2013   CATARACT EXTRACTION W/PHACO Left 12/02/2016   Procedure: CATARACT EXTRACTION PHACO AND INTRAOCULAR LENS PLACEMENT ([place]);  Surgeon: Estill Cotta, MD;  Location: [facility];  Service: Ophthalmology;  Laterality: Left;  [place] 2:14 AP% 28.4 CDE 59.73 Fluid  pack lot # [id number]   DIAGNOSTIC LAPAROSCOPY     IR CT HEAD LTD  04/19/2021   IR PERCUTANEOUS ART THROMBECTOMY/INFUSION INTRACRANIAL INC DIAG ANGIO  04/19/2021       IR PERCUTANEOUS ART THROMBECTOMY/INFUSION INTRACRANIAL INC DIAG ANGIO  04/19/2021   IR US GUIDE VASC ACCESS LEFT  04/19/2021   RADIOLOGY WITH ANESTHESIA N/A 04/19/2021   Procedure: IR WITH ANESTHESIA;  Surgeon: Luanne Bras, MD;  Location: [facility];  Service: Radiology;  Laterality: N/A;    TEE WITHOUT CARDIOVERSION N/A 04/24/2021   Procedure: TRANSESOPHAGEAL ECHOCARDIOGRAM (TEE);  Surgeon: Sueanne Margarita, MD;  Location: [facility];  Service: Cardiovascular;  Laterality: N/A;   TOTAL HIP ARTHROPLASTY Right 04/04/2018   Procedure: TOTAL HIP ARTHROPLASTY;  Surgeon: Dereck Leep, MD;  Location: [facility];  Service: Orthopedics;  Laterality: Right;    Prior to Admission medications   Medication Sig Start Date End Date Taking? Authorizing Provider  acetaminophen (TYLENOL) 325 MG tablet Take 2 tablets (650 mg total) by mouth every 4 (four) hours as needed for mild pain (or temp > 37.5 C (99.5 F)). 04/29/21   Olivencia-Simmons, Prince Rome, NP  ALPRAZolam (XANAX) 0.5 MG tablet TAKE 1 TABLET BY MOUTH 2 TIMES DAILY AS NEEDED FOR ANXIETY. 06/20/21   Birdie Sons, MD  amLODipine (NORVASC) 10 MG tablet Take 1 tablet (10 mg total) by mouth at bedtime. 05/26/21   Angiulli, Lavon Paganini, PA-C  aspirin 81 MG chewable tablet Chew 1 tablet (81 mg total) by mouth daily. 04/29/21   Olivencia-Simmons, Prince Rome, NP  atorvastatin (LIPITOR) 40 MG tablet Take 1 tablet (40 mg total) by mouth daily. 05/26/21   Angiulli, Lavon Paganini, PA-C  COVID-19 At Home Antigen Test [facility] COVID-19 HOME TEST) KIT use as directed 06/09/21   Letta Median, RPH  diclofenac Sodium (VOLTAREN) 1 % GEL Apply 2 g topically 4 (four) times daily as needed (pain). 06/20/21   Birdie Sons, MD  gabapentin (NEURONTIN) 300 MG capsule Take 1 capsule (300 mg total) by mouth at bedtime. For neuropathic pain 06/20/21   Birdie Sons, MD  glucose blood (FREESTYLE LITE) test strip Also needs Lancets. Use to check blood sugar up to four times a day for insulin dependant diabetes 06/20/21   Birdie Sons, MD  glyBURIDE (DIABETA) 5 MG tablet Take 1 tablet (5 mg total) by mouth daily with breakfast. 05/27/21   Angiulli, Lavon Paganini, PA-C  hydrALAZINE (APRESOLINE) 10 MG tablet Take 1 tablet (10 mg total) by mouth at bedtime. 05/26/21   Angiulli, Lavon Paganini, PA-C  Insulin Glargine-yfgn (SEMGLEE, YFGN,) 100 UNIT/ML SOPN Inject 10 Units into the skin 2 (two) times daily. 05/26/21   Angiulli, Lavon Paganini, PA-C  Insulin Glargine-yfgn (SEMGLEE, YFGN,) 100 UNIT/ML SOPN Inject 20 Units into the skin every evening. 06/20/21   Birdie Sons, MD  Insulin Pen Needle (UNIFINE PENTIPS) 31G X 6 MM MISC use with saxenda twice daily Patient not taking: Reported on 06/20/2021 05/28/21   Angiulli, Lavon Paganini, PA-C  losartan (COZAAR) 50 MG tablet Take 1 tablet (50 mg total) by mouth 2 (two) times daily. 05/26/21   Angiulli, Lavon Paganini, PA-C  metFORMIN (GLUCOPHAGE) 500 MG tablet Take 2 tablets (1,000 mg total) by mouth 2 (two) times daily with a meal. 05/26/21   Angiulli, Lavon Paganini, PA-C  methocarbamol (ROBAXIN) 500 MG tablet Take 1 tablet (500 mg total) by mouth 2 (two) times daily as needed for muscle spasms. 05/26/21   Angiulli, Lavon Paganini, PA-C  Multiple Vitamin (MULTIVITAMIN) tablet Take 1 tablet by mouth daily.    [provider]  pantoprazole (PROTONIX) 40 MG tablet Take 1 tablet (40 mg total) by mouth at bedtime. 05/26/21   Angiulli, Lavon Paganini, PA-C  rOPINIRole (REQUIP) 0.5 MG tablet Take 1 tablet (0.5 mg total) by mouth at bedtime. 05/26/21   Angiulli, Lavon Paganini, PA-C  saccharomyces boulardii (FLORASTOR) 250 MG capsule Take 1 capsule (250 mg total) by mouth 2 (two) times daily. 05/26/21   Angiulli, Lavon Paganini, PA-C  senna-docusate (SENOKOT-S) 8.6-50 MG tablet Take 2 tablets by mouth at bedtime. 05/26/21   Angiulli, Lavon Paganini, PA-C  sertraline (ZOLOFT) 100 MG tablet Take 1 tablet (100 mg total) by mouth daily. 05/26/21   Angiulli, Lavon Paganini, PA-C    Allergies Jardiance [empagliflozin] and Penicillins  Family History  Problem Relation Age of Onset   Bone cancer Brother    Aneurysm Mother        brain   Diabetes Father    CAD Father    Heart failure Father    Colon cancer Father    Cancer Father    Alzheimer's disease Paternal Grandmother    Dementia Paternal  Grandmother    Mental illness Paternal Grandfather    Healthy Daughter    Healthy Son    Breast cancer Neg Hx     Social History Social History   Tobacco Use   Smoking status: Former    Packs/day: 1.50    Years: 25.00    Pack years: 37.50    Types: Cigarettes    Quit date: 03/24/2007    Years since quitting: 14.2   Smokeless tobacco: Never  Vaping Use   Vaping Use: Never used  Substance Use Topics   Alcohol use: Yes    Alcohol/week: 0.0 standard drinks    Comment: 1- every 3-4 months   Drug use: No    Review of Systems  Review of Systems  Constitutional:  Negative for chills and fever.  HENT:  Negative for sore throat.   Eyes:  Negative for pain.  Respiratory:  Negative for cough and stridor.   Cardiovascular:  Negative for chest pain.  Gastrointestinal:  Positive for nausea and vomiting.  Genitourinary:  Negative for dysuria.  Musculoskeletal:  Negative for myalgias.  Skin:  Negative for rash.  Neurological:  Positive for dizziness. Negative for seizures, loss of consciousness and headaches.  Psychiatric/Behavioral:  Negative for suicidal ideas.   All other systems reviewed and are negative.    ____________________________________________   PHYSICAL EXAM:  VITAL SIGNS: ED Triage Vitals  Enc Vitals Group     BP 07/02/21 1458 (!) 126/92     Pulse Rate 07/02/21 1458 88     Resp 07/02/21 1458 18     Temp 07/02/21 1458 98 F (36.7 C)     Temp Source 07/02/21 1458 Oral     SpO2 07/02/21 1458 100 %     Weight 07/02/21 1459 200 lb (90.7 kg)     Height 07/02/21 1459 5' 8"  (1.727 m)     Head Circumference --      Peak Flow --      Pain Score 07/02/21 1458 0     Pain Loc --      Pain Edu? --      Excl. in [place]? --    Vitals:   07/02/21 1458 07/02/21 1809  BP: (!) 126/92 (!) 140/91  Pulse: 88 95  Resp: 18 18  Temp: 98 F (36.7  C)   SpO2: 100% 98%   Physical Exam Vitals and nursing note reviewed.  Constitutional:      General: She is not in acute  distress.    Appearance: She is well-developed. She is obese.  HENT:     Head: Normocephalic and atraumatic.     Right Ear: External ear normal.     Left Ear: External ear normal.     Nose: Nose normal.     Mouth/Throat:     Mouth: Mucous membranes are dry.  Eyes:     Conjunctiva/sclera: Conjunctivae normal.  Cardiovascular:     Rate and Rhythm: Normal rate and regular rhythm.     Heart sounds: No murmur heard. Pulmonary:     Effort: Pulmonary effort is normal. No respiratory distress.     Breath sounds: Normal breath sounds.  Abdominal:     Palpations: Abdomen is soft.     Tenderness: There is no abdominal tenderness.  Musculoskeletal:     Cervical back: Neck supple.  Skin:    General: Skin is warm and dry.     Capillary Refill: Capillary refill takes 2 to 3 seconds.  Neurological:     Mental Status: She is alert and oriented to person, place, and time.  Psychiatric:        Mood and Affect: Mood normal.    Cranial nerves II through XII grossly intact.  No pronator drift.  Patient has some unsteadiness in her left upper extremity on testing of finger dysmetria which she states is chronic from recent stroke.  No dysmetria noted on the right.  Patient seems to have decree strength in the left leg compared to the right which he states is chronic from her recent stroke.  Sensation is intact to light touch all extremities although patient states he feels little different in her left arm which is also chronic.  No nystagmus. ____________________________________________   LABS (all labs ordered are listed, but only abnormal results are displayed)  Labs Reviewed  BASIC METABOLIC PANEL - Abnormal; Notable for the following components:      Result Value   Sodium 134 (*)    Chloride 97 (*)    CO2 21 (*)    Glucose, Bld 320 (*)    Anion gap 16 (*)    All other components within normal limits  HEPATIC FUNCTION PANEL - Abnormal; Notable for the following components:   Total Protein  8.2 (*)    All other components within normal limits  CBG MONITORING, ED - Abnormal; Notable for the following components:   Glucose-Capillary 310 (*)    All other components within normal limits  CBC  LIPASE, BLOOD  URINALYSIS, COMPLETE (UACMP) WITH MICROSCOPIC  TROPONIN I (HIGH SENSITIVITY)  TROPONIN I (HIGH SENSITIVITY)   ____________________________________________  EKG  Sinus rhythm with a ventricle rate of 93, normal axis, unremarkable intervals and nonspecific ST change in lead III without other clear evidence of acute ischemia or significant arrhythmia. ____________________________________________  RADIOLOGY  ED MD interpretation:    Official radiology report(s): No results found.  ____________________________________________   PROCEDURES  Procedure(s) performed (including Critical Care):  .1-3 Lead EKG Interpretation  Date/Time: 07/02/2021 7:22 PM Performed by: Lucrezia Starch, MD Authorized by: Lucrezia Starch, MD     Interpretation: normal     ECG rate assessment: normal     Rhythm: sinus rhythm     Ectopy: none     Conduction: normal     ____________________________________________   INITIAL IMPRESSION /  ASSESSMENT AND PLAN / ED COURSE      Patient presents with above-stated history exam for assessment of an episode of dizziness described as vertiginous that she experienced for a few minutes this morning when she was moving around in bed.  She states she had some nonbloody emesis vomiting but that the symptoms have since resolved.  She think she may be a little dehydrated as her sugars were up from eating 20 carbs last night.  On arrival she is afebrile and hemodynamically stable.  She has no evidence of any acute neurological deficits.  Low suspicion for acute CVA seems patient may experience some peripheral vertigo.  Unclear if her nausea and vomiting was related to an episode of peripheral vertigo.  Additional differential includes metabolic  derangements, atypical presentation for ACS, acute gastritis possible food poisoning.  Low suspicion for toxic ingestion and there is no history of recent trauma.  ECG has nonspecific findings without evidence of arrhythmia.  Given nonelevated troponin I have a very low suspicion for atypical presentation for ACS.   CBC has no leukocytosis or acute anemia.  BMP remarkable for glucose of 320, bicarb of 21 and anion gap of 16 suggestive of some mild dehydration from hyperglycemia but not clearly suggestive of DKA or and there is no other significant lecture to metabolic derangements.  Hepatic function panel and lipase are unremarkable.  On several reassessments patient states she feels much better and has no vertigo or nausea and given absence of any focal deficits new today or other concerning features on history exam Evalose patient for me life-threatening process and think she is stable for close outpatient follow-up.  Discharged in stable condition.  Strict return precautions advised and discussed.        ____________________________________________   FINAL CLINICAL IMPRESSION(S) / ED DIAGNOSES  Final diagnoses:  Hyperglycemia  Gastritis without bleeding, unspecified chronicity, unspecified gastritis type  Mild dehydration    Medications  insulin aspart (novoLOG) injection 0-15 Units (11 Units Subcutaneous Given 07/02/21 1820)  ondansetron (ZOFRAN-ODT) disintegrating tablet 4 mg (4 mg Oral Given 07/02/21 1806)  lactated ringers bolus 500 mL (500 mLs Intravenous New Bag/Given 07/02/21 1819)     ED Discharge Orders     None        Note:  This document was prepared using Dragon voice recognition software and may include unintentional dictation errors.    Lucrezia Starch, MD 07/02/21 [place]

## 2021-07-02 NOTE — Telephone Encounter (Signed)
FYI

## 2021-07-02 NOTE — ED Triage Notes (Signed)
First nurse note: comes ems from home with dizziness, n/v, hyperglycemia. Took 10u insulin two hours ago. CBG 352 with EMS. AOx4. Hx of stroke 3 months ago.

## 2021-07-02 NOTE — ED Provider Notes (Signed)
Emergency Medicine Provider Triage Evaluation Note  [name] , a 65 y.o. female  was evaluated in triage.  Pt complains of elevated glucose, nausea, dizziness.  Patient is concerned as her glucose has been elevated.  However patient has been eating biscuits and carb heavy foods while at her daughter's house.  She recently had a stroke.  She thinks she is dehydrated..  Review of Systems  Positive: Nausea/vomiting, dizziness Negative: No chest pain or shortness of breath, no strokelike symptoms  Physical Exam  BP (!) 126/92   Pulse 88   Temp 98 F (36.7 C) (Oral)   Resp 18   Ht 5\' 8"  (1.727 m)   Wt 90.7 kg   SpO2 100%   BMI 30.41 kg/m  Gen:   Awake, no distress   Resp:  Normal effort  MSK:   Moves extremities without difficulty  Other:  Heart sounds are normal, normal rate and rhythm  Medical Decision Making  Medically screening exam initiated at 5:05 PM.  Appropriate orders placed.  [name] was informed that the remainder of the evaluation will be completed by another provider, this initial triage assessment does not replace that evaluation, and the importance of remaining in the ED until their evaluation is complete.  The patient was evaluated in the lobby.  She appears to be stable at this time.  She was instructed to wait as she should be brought back shortly.  I did tell her that her glucose was elevated but not severely.  Other labs appear to be normal.  She states she will stay.   Kimberly Starks, PA-C 07/02/21 1708    Kimberly Drafts, MD 07/02/21 [phone]

## 2021-07-02 NOTE — Telephone Encounter (Signed)
Reason for Disposition  Sounds like a life-threatening emergency to the triager    Pt just out of the hospital from having a stroke.   She is very dizzy now and "feels like I'm having another stroke".  Answer Assessment - Initial Assessment Questions 1. DESCRIPTION: "Describe your dizziness."     I'm dizzy and vomiting.   I'm recovering from a stroke.   My glucose is over 300.   I've been in [facility] for 2 months.   I woke up real dizzy this morning.   I have a mass in my brain that had to be removed.   A piece of it broke off and went to my brain causing the stroke. I've been vomiting since this morning.   EMS did come out.   They did a EKG and it did not show anything.   I tried to eat but vomited.   8:30 the EMS came out.   BP was fine.  Pulse ox was 96%.   Everything was good.  2. LIGHTHEADED: "Do you feel lightheaded?" (e.g., somewhat faint, woozy, weak upon standing)     No 3. VERTIGO: "Do you feel like either you or the room is spinning or tilting?" (i.e. vertigo)     The room is spinning around.   I didn't know I was having a stroke when it happened.   I work at [employer].   They can't call rapid response for an employee emergency.   I had the stroke at work.    4. SEVERITY: "How bad is it?"  "Do you feel like you are going to faint?" "Can you stand and walk?"   - MILD: Feels slightly dizzy, but walking normally.   - MODERATE: Feels unsteady when walking, but not falling; interferes with normal activities (e.g., school, work).   - SEVERE: Unable to walk without falling, or requires assistance to walk without falling; feels like passing out now.      I walk with a walker   5. ONSET:  "When did the dizziness begin?"     8:00 this morning is when I woke up 6. AGGRAVATING FACTORS: "Does anything make it worse?" (e.g., standing, change in head position)     When I turn my head I get more dizzy.   When I'm laying down the dizziness is better but worse when I get.    I thought I was having another  stroke this morning.   7. HEART RATE: "Can you tell me your heart rate?" "How many beats in 15 seconds?"  (Note: not all patients can do this)       Not asked 8. CAUSE: "What do you think is causing the dizziness?"     I'm think I'm having another stroke Triage stopped at this point and I instructed her to call 911. 9. RECURRENT SYMPTOM: "Have you had dizziness before?" If Yes, ask: "When was the last time?" "What happened that time?"     *No Answer* 10. OTHER SYMPTOMS: "Do you have any other symptoms?" (e.g., fever, chest pain, vomiting, diarrhea, bleeding)       *No Answer* 11. PREGNANCY: "Is there any chance you are pregnant?" "When was your last menstrual period?"       *No Answer*  Protocols used: Dizziness - Lightheadedness-A-AH

## 2021-07-02 NOTE — Telephone Encounter (Signed)
Pt called in c/o being very dizzy.   She just got out of the hospital [facility] where she has been for 2 months after having a stroke in May 2022.   I had a clot in my heart that broke off and went into my brain.   They did some kind of surgery and was able to remove the clot.   I have been dizzy since waking up at 8:00 this morning.  See triage notes.   EMS did come out to the house this morning and said everything looked good even the EKG.   My glucose is over 300 right now  and I don't feel good.    "I think I might be having another stroke".  I instructed her to call 911 immediately.   Did not finish the triage protocol.   She her daughter was with her and they were only 8 minutes away from [facility] so she was going to have her daughter take her to the ED now.     I sent my notes to [facility] for Dr. Caryn Section.

## 2021-07-02 NOTE — ED Notes (Signed)
Pt. Discharge, MD notified this RN that no further meds or CBG monitoring needed prior to discharge.

## 2021-07-03 ENCOUNTER — Telehealth: Payer: Self-pay

## 2021-07-03 ENCOUNTER — Other Ambulatory Visit: Payer: Self-pay

## 2021-07-03 ENCOUNTER — Telehealth: Payer: Self-pay | Admitting: Family Medicine

## 2021-07-03 ENCOUNTER — Other Ambulatory Visit: Payer: Self-pay | Admitting: Family Medicine

## 2021-07-03 DIAGNOSIS — E669 Obesity, unspecified: Secondary | ICD-10-CM | POA: Diagnosis not present

## 2021-07-03 DIAGNOSIS — K219 Gastro-esophageal reflux disease without esophagitis: Secondary | ICD-10-CM | POA: Diagnosis not present

## 2021-07-03 DIAGNOSIS — E785 Hyperlipidemia, unspecified: Secondary | ICD-10-CM

## 2021-07-03 DIAGNOSIS — E1165 Type 2 diabetes mellitus with hyperglycemia: Secondary | ICD-10-CM | POA: Diagnosis not present

## 2021-07-03 DIAGNOSIS — E7800 Pure hypercholesterolemia, unspecified: Secondary | ICD-10-CM | POA: Diagnosis not present

## 2021-07-03 DIAGNOSIS — K5901 Slow transit constipation: Secondary | ICD-10-CM | POA: Diagnosis not present

## 2021-07-03 DIAGNOSIS — F419 Anxiety disorder, unspecified: Secondary | ICD-10-CM | POA: Diagnosis not present

## 2021-07-03 DIAGNOSIS — I10 Essential (primary) hypertension: Secondary | ICD-10-CM | POA: Diagnosis not present

## 2021-07-03 DIAGNOSIS — I69354 Hemiplegia and hemiparesis following cerebral infarction affecting left non-dominant side: Secondary | ICD-10-CM | POA: Diagnosis not present

## 2021-07-03 DIAGNOSIS — F3341 Major depressive disorder, recurrent, in partial remission: Secondary | ICD-10-CM | POA: Diagnosis not present

## 2021-07-03 DIAGNOSIS — R110 Nausea: Secondary | ICD-10-CM

## 2021-07-03 MED ORDER — HYDRALAZINE HCL 10 MG PO TABS
10.0000 mg | ORAL_TABLET | Freq: Every day | ORAL | 1 refills | Status: DC
  Filled 2021-07-03: qty 90, 90d supply, fill #0

## 2021-07-03 MED ORDER — PANTOPRAZOLE SODIUM 40 MG PO TBEC
40.0000 mg | DELAYED_RELEASE_TABLET | Freq: Every day | ORAL | 1 refills | Status: DC
  Filled 2021-07-03: qty 90, 90d supply, fill #0

## 2021-07-03 MED ORDER — LOSARTAN POTASSIUM 50 MG PO TABS
50.0000 mg | ORAL_TABLET | Freq: Two times a day (BID) | ORAL | 1 refills | Status: DC
  Filled 2021-07-03: qty 180, 90d supply, fill #0

## 2021-07-03 MED ORDER — GLYBURIDE 5 MG PO TABS
5.0000 mg | ORAL_TABLET | Freq: Every day | ORAL | 1 refills | Status: DC
  Filled 2021-07-03: qty 90, 90d supply, fill #0

## 2021-07-03 MED ORDER — METFORMIN HCL 500 MG PO TABS
1000.0000 mg | ORAL_TABLET | Freq: Two times a day (BID) | ORAL | 1 refills | Status: DC
  Filled 2021-07-03: qty 360, 90d supply, fill #0

## 2021-07-03 MED ORDER — ATORVASTATIN CALCIUM 40 MG PO TABS
40.0000 mg | ORAL_TABLET | Freq: Every day | ORAL | 1 refills | Status: DC
  Filled 2021-07-03: qty 90, 90d supply, fill #0

## 2021-07-03 MED ORDER — ROPINIROLE HCL 0.5 MG PO TABS
0.5000 mg | ORAL_TABLET | Freq: Every day | ORAL | 1 refills | Status: DC
  Filled 2021-07-03: qty 90, 90d supply, fill #0

## 2021-07-03 MED ORDER — AMLODIPINE BESYLATE 10 MG PO TABS
10.0000 mg | ORAL_TABLET | Freq: Every day | ORAL | 1 refills | Status: DC
  Filled 2021-07-03: qty 90, 90d supply, fill #0

## 2021-07-03 NOTE — Telephone Encounter (Signed)
Patient would like to speak with nurse about medication that was taken away when she wen to the ER. She wants to discuss if this could affect her blood sugar. Please call back

## 2021-07-03 NOTE — Telephone Encounter (Signed)
Patient was seen in the ER yesterday and was found to be hyperglycemic. Her home blood sugar reading today was 175. She is currently injecting 20 units of insulin Glargine in the evenings. She believes that the previous diabetes medication regiment she was on before she had her stroke in May (04/2021)  worked better to control her diabetes. Patient wants to know if Dr. Caryn Section thinks she needs to go back to what she was taking before she had her stroke in May.   Patient also request a prescription to help with nausea. She says the ED didn't give her a prescription for nausea medication.   She has an appointment for a follow up scheduled 07/16/2021, but wants to know if you could see her sooner.   Please advise.

## 2021-07-03 NOTE — Telephone Encounter (Signed)
Requested medications are due for refill today yes  Requested medications are on the active medication list yes  Last refill 05/27/21  Last visit 06/20/21  Future visit scheduled 07/2021  Notes to clinic Not Delegated.

## 2021-07-03 NOTE — Telephone Encounter (Signed)
Copied from [place] [phone]. Topic: Appointment Scheduling - Scheduling Inquiry for Clinic >> Jul 03, 2021  2:01 PM Bayard Beaver wrote: Reason for CRM: patient would like to be called if earlier appt becomes available

## 2021-07-03 NOTE — Telephone Encounter (Signed)
Copied from [place] [phone]. Topic: General - Other >> Jul 03, 2021  1:47 PM Yvette Rack wrote: Reason for CRM: Missy Hylton with [facility] reports pt missed visit for OT. Per Missy pt went to ED and was treated and sent home. Cb# [phone]

## 2021-07-04 ENCOUNTER — Other Ambulatory Visit: Payer: Self-pay

## 2021-07-04 MED ORDER — ONDANSETRON 4 MG PO TBDP
4.0000 mg | ORAL_TABLET | Freq: Three times a day (TID) | ORAL | 2 refills | Status: DC | PRN
  Filled 2021-07-04: qty 20, 7d supply, fill #0

## 2021-07-04 MED ORDER — METHOCARBAMOL 500 MG PO TABS
500.0000 mg | ORAL_TABLET | Freq: Two times a day (BID) | ORAL | 0 refills | Status: DC | PRN
  Filled 2021-07-04: qty 60, 30d supply, fill #0

## 2021-07-04 NOTE — Telephone Encounter (Signed)
Have sent prescription zofran to [facility] pharmacy.  Her a1c was 7.1 so she should continue the same insulin she is on now.

## 2021-07-04 NOTE — Telephone Encounter (Signed)
Pt advised.   Thanks,   -Tashan Kreitzer  

## 2021-07-07 ENCOUNTER — Other Ambulatory Visit: Payer: Self-pay

## 2021-07-07 ENCOUNTER — Other Ambulatory Visit: Payer: Self-pay | Admitting: Family Medicine

## 2021-07-07 DIAGNOSIS — E1122 Type 2 diabetes mellitus with diabetic chronic kidney disease: Secondary | ICD-10-CM

## 2021-07-07 DIAGNOSIS — N182 Chronic kidney disease, stage 2 (mild): Secondary | ICD-10-CM

## 2021-07-07 MED ORDER — FREESTYLE LANCETS MISC
3 refills | Status: DC
  Filled 2021-07-07: qty 100, fill #0

## 2021-07-07 MED ORDER — FREESTYLE FREEDOM LITE W/DEVICE KIT
PACK | 0 refills | Status: DC
  Filled 2021-07-07: qty 1, 30d supply, fill #0

## 2021-07-07 MED ORDER — FREESTYLE LANCETS MISC
0 refills | Status: DC
  Filled 2021-07-07: qty 100, 25d supply, fill #0

## 2021-07-08 DIAGNOSIS — E669 Obesity, unspecified: Secondary | ICD-10-CM | POA: Diagnosis not present

## 2021-07-08 DIAGNOSIS — K5901 Slow transit constipation: Secondary | ICD-10-CM | POA: Diagnosis not present

## 2021-07-08 DIAGNOSIS — F3341 Major depressive disorder, recurrent, in partial remission: Secondary | ICD-10-CM | POA: Diagnosis not present

## 2021-07-08 DIAGNOSIS — E7800 Pure hypercholesterolemia, unspecified: Secondary | ICD-10-CM | POA: Diagnosis not present

## 2021-07-08 DIAGNOSIS — K219 Gastro-esophageal reflux disease without esophagitis: Secondary | ICD-10-CM | POA: Diagnosis not present

## 2021-07-08 DIAGNOSIS — I69354 Hemiplegia and hemiparesis following cerebral infarction affecting left non-dominant side: Secondary | ICD-10-CM | POA: Diagnosis not present

## 2021-07-08 DIAGNOSIS — F419 Anxiety disorder, unspecified: Secondary | ICD-10-CM | POA: Diagnosis not present

## 2021-07-08 DIAGNOSIS — E1165 Type 2 diabetes mellitus with hyperglycemia: Secondary | ICD-10-CM | POA: Diagnosis not present

## 2021-07-08 DIAGNOSIS — I10 Essential (primary) hypertension: Secondary | ICD-10-CM | POA: Diagnosis not present

## 2021-07-08 NOTE — Progress Notes (Signed)
Cardiology Office Note:    Date:  07/09/2021  ID:  [name], DOB 1956-06-13, MRN [id number]  PCP:  Birdie Sons, MD   [facility] Providers Cardiologist:  Werner Lean, MD     CC: PFE follow up  History of Present Illness:    [name] is a 64 y.o. female with a hx of recent stroke due to a embolization of a PFE, obstructive CAD but CT-FFR who presents after recovering from the stroke.  Seen virtually 06/30/21.  Stopped plavix at that time.  Patient notes that she is doing well.  Since virtual visit is still out on FMLA but notes significant improvement in all but her left hand.  No chest pain or pressure .  No SOB/DOE and no PND/Orthopnea.  No weight gain or leg swelling.  No palpitations or syncope .  Notes anxiety about the lesion in her heart.   Past Medical History:  Diagnosis Date   Anxiety    Arthritis    Cataract    left   Depression    Diabetes mellitus without complication ([place])    Environmental and seasonal allergies    GERD (gastroesophageal reflux disease)    Hyperlipidemia    Hypertension    Joint pain    as reported by patient   Right thalamic infarction ([place]) 04/29/2021   Urinary incontinence    as stated by patient    Past Surgical History:  Procedure Laterality Date   BREAST EXCISIONAL BIOPSY Right 1999   neg   BREAST LUMPECTOMY Right 2005   as reported by patient   BUBBLE STUDY  04/24/2021   Procedure: BUBBLE STUDY;  Surgeon: Sueanne Margarita, MD;  Location: [facility];  Service: Cardiovascular;;   CATARACT EXTRACTION  January 2013   CATARACT EXTRACTION W/PHACO Left 12/02/2016   Procedure: CATARACT EXTRACTION PHACO AND INTRAOCULAR LENS PLACEMENT ([place]);  Surgeon: Estill Cotta, MD;  Location: [facility];  Service: Ophthalmology;  Laterality: Left;  [place] 2:14 AP% 28.4 CDE 59.73 Fluid pack lot # [id number]   DIAGNOSTIC LAPAROSCOPY     IR CT HEAD LTD  04/19/2021   IR PERCUTANEOUS ART THROMBECTOMY/INFUSION INTRACRANIAL INC  DIAG ANGIO  04/19/2021       IR PERCUTANEOUS ART THROMBECTOMY/INFUSION INTRACRANIAL INC DIAG ANGIO  04/19/2021   IR US GUIDE VASC ACCESS LEFT  04/19/2021   RADIOLOGY WITH ANESTHESIA N/A 04/19/2021   Procedure: IR WITH ANESTHESIA;  Surgeon: Luanne Bras, MD;  Location: [place] [facility];  Service: Radiology;  Laterality: N/A;   TEE WITHOUT CARDIOVERSION N/A 04/24/2021   Procedure: TRANSESOPHAGEAL ECHOCARDIOGRAM (TEE);  Surgeon: Sueanne Margarita, MD;  Location: [facility];  Service: Cardiovascular;  Laterality: N/A;   TOTAL HIP ARTHROPLASTY Right 04/04/2018   Procedure: TOTAL HIP ARTHROPLASTY;  Surgeon: Dereck Leep, MD;  Location: [facility];  Service: Orthopedics;  Laterality: Right;    Current Medications: Current Meds  Medication Sig   acetaminophen (TYLENOL) 325 MG tablet Take 2 tablets (650 mg total) by mouth every 4 (four) hours as needed for mild pain (or temp > 37.5 C (99.5 F)).   ALPRAZolam (XANAX) 0.5 MG tablet TAKE 1 TABLET BY MOUTH 2 TIMES DAILY AS NEEDED FOR ANXIETY.   amLODipine (NORVASC) 10 MG tablet Take 1 tablet (10 mg total) by mouth at bedtime.   aspirin 81 MG chewable tablet Chew 1 tablet (81 mg total) by mouth daily.   atorvastatin (LIPITOR) 40 MG tablet Take 1 tablet (40 mg total) by mouth daily.  Blood Glucose Monitoring Suppl (FREESTYLE FREEDOM LITE) w/Device KIT USE AS DIRECTED   diclofenac Sodium (VOLTAREN) 1 % GEL Apply 2 g topically 4 (four) times daily as needed (pain).   gabapentin (NEURONTIN) 300 MG capsule Take 1 capsule (300 mg total) by mouth at bedtime. For neuropathic pain   glucose blood (FREESTYLE LITE) test strip Also needs Lancets. Use to check blood sugar up to four times a day for insulin dependant diabetes   glyBURIDE (DIABETA) 5 MG tablet Take 1 tablet (5 mg total) by mouth daily with breakfast.   hydrALAZINE (APRESOLINE) 10 MG tablet Take 1 tablet (10 mg total) by mouth at bedtime.   insulin glargine-yfgn (SEMGLEE, YFGN,) 100 UNIT/ML SOPN Inject 10 Units into  the skin 2 (two) times daily.   Insulin Glargine-yfgn (SEMGLEE, YFGN,) 100 UNIT/ML SOPN Inject 20 Units into the skin every evening.   Insulin Pen Needle (UNIFINE PENTIPS) 31G X 6 MM MISC use with saxenda twice daily   Lancets (FREESTYLE) lancets USE TO CHECK BLOOD SUGAR UP TO 4 TIMES A DAY   losartan (COZAAR) 50 MG tablet Take 1 tablet (50 mg total) by mouth 2 (two) times daily.   metFORMIN (GLUCOPHAGE) 500 MG tablet Take 2 tablets (1,000 mg total) by mouth 2 (two) times daily with a meal.   methocarbamol (ROBAXIN) 500 MG tablet Take 1 tablet (500 mg total) by mouth 2 (two) times daily as needed for muscle spasms.   Multiple Vitamin (MULTIVITAMIN) tablet Take 1 tablet by mouth daily.   ondansetron (ZOFRAN ODT) 4 MG disintegrating tablet Take 1 tablet (4 mg total) by mouth every 8 (eight) hours as needed for nausea or vomiting.   pantoprazole (PROTONIX) 40 MG tablet Take 1 tablet (40 mg total) by mouth at bedtime.   rOPINIRole (REQUIP) 0.5 MG tablet Take 1 tablet (0.5 mg total) by mouth at bedtime.   saccharomyces boulardii (FLORASTOR) 250 MG capsule Take 1 capsule (250 mg total) by mouth 2 (two) times daily.   senna-docusate (SENOKOT-S) 8.6-50 MG tablet Take 2 tablets by mouth at bedtime.   sertraline (ZOLOFT) 100 MG tablet Take 1 tablet (100 mg total) by mouth daily.   [DISCONTINUED] COVID-19 At Home Antigen Test [facility] COVID-19 HOME TEST) KIT use as directed     Allergies:   Jardiance [empagliflozin] and Penicillins   Social History   Socioeconomic History   Marital status: Widowed    Spouse name: Not on file   Number of children: Not on file   Years of education: Not on file   Highest education level: Not on file  Occupational History    Employer: [employer]  Tobacco Use   Smoking status: Former    Packs/day: 1.50    Years: 25.00    Pack years: 37.50    Types: Cigarettes    Quit date: 03/24/2007    Years since quitting: 14.3   Smokeless tobacco: Never  Vaping Use    Vaping Use: Never used  Substance and Sexual Activity   Alcohol use: Yes    Alcohol/week: 0.0 standard drinks    Comment: 1- every 3-4 months   Drug use: No   Sexual activity: Not on file  Other Topics Concern   Not on file  Social History Narrative   Not on file   Social Determinants of Health   Financial Resource Strain: Not on file  Food Insecurity: Not on file  Transportation Needs: Not on file  Physical Activity: Not on file  Stress: Not on file  Social Connections: Not on file    Social: has son and daughter; daugther is her ride to visits  Family History: The patient's family history includes Alzheimer's disease in her paternal grandmother; Aneurysm in her mother; Bone cancer in her brother; CAD in her father; Cancer in her father; Colon cancer in her father; Dementia in her paternal grandmother; Diabetes in her father; Healthy in her daughter and son; Heart failure in her father; Mental illness in her paternal grandfather. There is no history of Breast cancer.  ROS:   Please see the history of present illness.     EKGs/Labs/Other Studies Reviewed:    EKG 07/09/21: SR rate 86 Baseline Artifact  Transthoracic Echocardiogram: Date:04/19/21 Results:  1. Left ventricular ejection fraction, by estimation, is 70 to 75%. The  left ventricle has hyperdynamic function. The left ventricle has no  regional wall motion abnormalities. Left ventricular diastolic parameters  are consistent with Grade I diastolic  dysfunction (impaired relaxation). Elevated left atrial pressure.   2. Right ventricular systolic function is normal. The right ventricular  size is normal. Tricuspid regurgitation signal is inadequate for assessing  PA pressure.   3. Left atrial size was mildly dilated.   4. The mitral valve is normal in structure. Mild mitral valve  regurgitation. No evidence of mitral stenosis.   5. The aortic valve is normal in structure. Aortic valve regurgitation is  not  visualized. No aortic stenosis is present.   6. The inferior vena cava is normal in size with greater than 50%  respiratory variability, suggesting right atrial pressure of 3 mmHg.   Transesophageal Echocardiogram: Date:04/24/21 Results:  1. Left ventricular ejection fraction, by estimation, is 60 to 65%. The  left ventricle has normal function. The left ventricle has no regional  wall motion abnormalities.   2. Prominent papillary muscle is noted. Right ventricular systolic  function is normal. The right ventricular size is normal.   3. Left atrial size was mildly dilated. No left atrial/left atrial  appendage thrombus was detected.   4. The mitral valve is normal in structure. No evidence of mitral valve  regurgitation. No evidence of mitral stenosis.   5. The aortic valve is abnormal. Trileaflet aortic valve with large well  defined shimmering highly mobile mass on the aortic side of the valve. 3D  images obtained to try to delineate the origin of the mass as difficult to  discern whether this is  originating from the valve leaflet or the aortic wall. 3D images  suggestive of origin of mass off the aortic valve cusp and most consistent  with papillary fibroelastoma. There is no significant AI. Aortic valve  regurgitation is not visualized. No aortic  stenosis is present.   6. The inferior vena cava is normal in size with greater than 50%  respiratory variability, suggesting right atrial pressure of 3 mmHg.   7. Agitated saline contrast bubble study was negative, with no evidence  of any interatrial shunt.   Cardiac CT : Date: 04/25/21 Results:   IMPRESSION: 1. Coronary calcium score of 1904. This was 99th percentile for age, sex, and race matched control.   2. Normal coronary origin with right dominance.   3. CAD-RADS 4 Severe stenosis. (70-99% or > 50% left main). CT FFR is recommended and will be sent. Consider symptom-guided anti-ischemic pharmacotherapy as well as risk  factor modification per guideline directed care.   4.  Aortic atherosclerosis noted.   5. There is a well defined, pedunculated, mobile density that may  be consistent with a papillary fibroelastoma as described above.   6. Mild to moderate mitral annular calcification noted.    Recent Labs: 09/20/2020: TSH 3.500 04/25/2021: Magnesium 1.5 07/02/2021: ALT 12; BUN 11; Creatinine, Ser 0.95; Hemoglobin 13.9; Platelets 324; Potassium 4.0; Sodium 134  Recent Lipid Panel    Component Value Date/Time   CHOL 195 04/19/2021 0427   CHOL 233 (H) 09/20/2020 1132   CHOL 205 (H) 09/06/2013 1518   TRIG 221 (H) 04/19/2021 0427   TRIG 174 09/06/2013 1518   HDL 46 04/19/2021 0427   HDL 47 09/20/2020 1132   HDL 53 09/06/2013 1518   CHOLHDL 4.2 04/19/2021 0427   VLDL 44 (H) 04/19/2021 0427   VLDL 35 09/06/2013 1518   LDLCALC 105 (H) 04/19/2021 0427   LDLCALC 152 (H) 09/20/2020 1132   LDLCALC 117 (H) 09/06/2013 1518    Physical Exam:    VS:  BP 112/72   Pulse 88   Ht _0  (1.702 m)   Wt 200 lb 3.2 oz (90.8 kg)   SpO2 96%   BMI 31.36 kg/m     Wt Readings from Last 3 Encounters:  07/09/21 200 lb 3.2 oz (90.8 kg)  07/02/21 200 lb (90.7 kg)  06/20/21 210 lb (95.3 kg)     GEN:  Well nourished, well developed in no acute distress HEENT: Normal NECK: No JVD LYMPHATICS: No lymphadenopathy CARDIAC: RRR, no murmurs, rubs, gallops RESPIRATORY:  Clear to auscultation without rales, wheezing or rhonchi  ABDOMEN: Soft, non-tender, non-distended MUSCULOSKELETAL:  No edema; No deformity  SKIN: Warm and dry NEUROLOGIC:  Alert and oriented x 3 PSYCHIATRIC:  Normal affect    ASSESSMENT:    1. Papillary fibroelastoma of heart   2. Essential hypertension   3. Dyslipidemia, goal LDL below 70   4. History of CVA with residual deficit   5. Coronary artery disease involving native coronary artery of native heart without angina pectoris    PLAN:    PFE CAD Aortic atherosclerosis DM Recent  embolic Surgery - continue ASA 81, atorvastatin 40 mg - continue norvasc 10 - will defer hydralazine 10 at bedtime to PCP; atypical dosing for cardiac indications - continue losartan 50 mg PO daily - asymptomatic from CAD  -If CT surgery colleagues need additional information outside of CCTA with FFR for surgical decision making, we can pursue a high risk LHC; we have discussed these risks with patient and family and they are amenable  Post surgery follow up unless new symptoms or abnormal test results warranting change in plan  Would be reasonable for  Video Visit Follow up Would be reasonable for  APP Follow up     Medication Adjustments/Labs and Tests Ordered: Current medicines are reviewed at length with the patient today.  Concerns regarding medicines are outlined above.  Orders Placed This Encounter  Procedures   EKG 12-Lead    No orders of the defined types were placed in this encounter.   Patient Instructions  Medication Instructions:  Your physician recommends that you continue on your current medications as directed. Please refer to the Current Medication list given to you today.  *If you need a refill on your cardiac medications before your next appointment, please call your pharmacy*   Lab Work: NONE If you have labs (blood work) drawn today and your tests are completely normal, you will receive your results only by: [place] (if you have MyChart) OR A paper copy in the mail If you have any lab  test that is abnormal or we need to change your treatment, we will call you to review the results.   Testing/Procedures: NONE   Follow-Up: At [facility], you and your health needs are our priority.  As part of our continuing mission to provide you with exceptional heart care, we have created designated Provider Care Teams.  These Care Teams include your primary Cardiologist (physician) and Advanced Practice Providers (APPs -  Physician Assistants and Nurse  Practitioners) who all work together to provide you with the care you need, when you need it.    Your next appointment:   6 -7 month(s)  The format for your next appointment:   In Person  Provider:   You may see Werner Lean, MD or one of the following Advanced Practice Providers on your designated Care Team:   Melina Copa, PA-C Ermalinda Barrios, PA-C     Signed, Werner Lean, MD  07/09/2021 10:56 AM    [place]

## 2021-07-09 ENCOUNTER — Encounter: Payer: Self-pay | Admitting: Internal Medicine

## 2021-07-09 ENCOUNTER — Ambulatory Visit (INDEPENDENT_AMBULATORY_CARE_PROVIDER_SITE_OTHER): Payer: 59 | Admitting: Internal Medicine

## 2021-07-09 ENCOUNTER — Other Ambulatory Visit: Payer: Self-pay

## 2021-07-09 VITALS — BP 112/72 | HR 88 | Ht 67.0 in | Wt 200.2 lb

## 2021-07-09 DIAGNOSIS — I2510 Atherosclerotic heart disease of native coronary artery without angina pectoris: Secondary | ICD-10-CM

## 2021-07-09 DIAGNOSIS — E785 Hyperlipidemia, unspecified: Secondary | ICD-10-CM | POA: Diagnosis not present

## 2021-07-09 DIAGNOSIS — I6930 Unspecified sequelae of cerebral infarction: Secondary | ICD-10-CM | POA: Diagnosis not present

## 2021-07-09 DIAGNOSIS — D151 Benign neoplasm of heart: Secondary | ICD-10-CM | POA: Diagnosis not present

## 2021-07-09 DIAGNOSIS — I10 Essential (primary) hypertension: Secondary | ICD-10-CM

## 2021-07-09 NOTE — Patient Instructions (Signed)
Medication Instructions:  Your physician recommends that you continue on your current medications as directed. Please refer to the Current Medication list given to you today.  *If you need a refill on your cardiac medications before your next appointment, please call your pharmacy*   Lab Work: NONE If you have labs (blood work) drawn today and your tests are completely normal, you will receive your results only by: [place] (if you have MyChart) OR A paper copy in the mail If you have any lab test that is abnormal or we need to change your treatment, we will call you to review the results.   Testing/Procedures: NONE   Follow-Up: At [facility], you and your health needs are our priority.  As part of our continuing mission to provide you with exceptional heart care, we have created designated Provider Care Teams.  These Care Teams include your primary Cardiologist (physician) and Advanced Practice Providers (APPs -  Physician Assistants and Nurse Practitioners) who all work together to provide you with the care you need, when you need it.    Your next appointment:   6 -7 month(s)  The format for your next appointment:   In Person  Provider:   You may see Werner Lean, MD or one of the following Advanced Practice Providers on your designated Care Team:   Melina Copa, PA-C Ermalinda Barrios, PA-C

## 2021-07-10 ENCOUNTER — Institutional Professional Consult (permissible substitution) (INDEPENDENT_AMBULATORY_CARE_PROVIDER_SITE_OTHER): Payer: 59 | Admitting: Thoracic Surgery (Cardiothoracic Vascular Surgery)

## 2021-07-10 VITALS — BP 140/80 | HR 83 | Resp 20 | Ht 67.0 in | Wt 200.0 lb

## 2021-07-10 DIAGNOSIS — D151 Benign neoplasm of heart: Secondary | ICD-10-CM

## 2021-07-10 NOTE — Progress Notes (Signed)
PCP is Fisher, Kirstie Peri, MD Referring Provider is Gwyneth Sprout, FNP  Chief Complaint  Patient presents with   papillary fibroelastoma    Surgical consult, Cardiac CT and CTA Chest 04/25/21, TEE 04/24/21, ECHO 04/19/21    HPI: [name] is sent for consultation regarding a papillary fibroelastoma of the aortic valve and coronary artery disease.  [name] is a 65 year old woman with a medical history of coronary artery disease, fibroblastoma of the aortic valve, embolic right thalamic infarction, hypertension, hyperlipidemia, type 2 diabetes, anxiety, arthritis, reflux and depression.  She was in her usual state of health until May when she developed acute onset of dizziness, left-sided weakness, left-sided numbness, and slurred speech.  She was taken to the emergency room at [facility] as a code stroke.  Her speech improved rapidly.  Initial CT was negative.  She was hypertensive.  Neurology was consulted and she was treated with tPA.  An MRI showed a small right thalamic infarct and occlusion of the segmental branch of the right posterior cerebral artery.  She was transferred to [facility].  She underwent retrieval of the embolus by IR.  She had a TEE which showed normal left ventricular function with a large mobile mass on the aortic valve consistent with a papillary fibroblastoma.  There was no aortic stenosis or insufficiency.  She then went through inpatient rehab.  She currently is staying with her daughter.  She still have some numbness in her left arm greater than leg and also some weakness in her left hand.  Speech is normal and she is able to walk with a cane or walker.  She denied having any chest pain, pressure, tightness, or shortness of breath.   Past Medical History:  Diagnosis Date   Anxiety    Arthritis    Cataract    left   Depression    Diabetes mellitus without complication ([place])    Environmental and seasonal allergies    GERD (gastroesophageal reflux disease)     Hyperlipidemia    Hypertension    Joint pain    as reported by patient   Right thalamic infarction ([place]) 04/29/2021   Urinary incontinence    as stated by patient    Past Surgical History:  Procedure Laterality Date   BREAST EXCISIONAL BIOPSY Right 1999   neg   BREAST LUMPECTOMY Right 2005   as reported by patient   BUBBLE STUDY  04/24/2021   Procedure: BUBBLE STUDY;  Surgeon: Sueanne Margarita, MD;  Location: [place];  Service: Cardiovascular;;   CATARACT EXTRACTION  January 2013   CATARACT EXTRACTION W/PHACO Left 12/02/2016   Procedure: CATARACT EXTRACTION PHACO AND INTRAOCULAR LENS PLACEMENT ([place]);  Surgeon: Estill Cotta, MD;  Location: [facility];  Service: Ophthalmology;  Laterality: Left;  [place] 2:14 AP% 28.4 CDE 59.73 Fluid pack lot # [id number]   DIAGNOSTIC LAPAROSCOPY     IR CT HEAD LTD  04/19/2021   IR PERCUTANEOUS ART THROMBECTOMY/INFUSION INTRACRANIAL INC DIAG ANGIO  04/19/2021       IR PERCUTANEOUS ART THROMBECTOMY/INFUSION INTRACRANIAL INC DIAG ANGIO  04/19/2021   IR US GUIDE VASC ACCESS LEFT  04/19/2021   RADIOLOGY WITH ANESTHESIA N/A 04/19/2021   Procedure: IR WITH ANESTHESIA;  Surgeon: Luanne Bras, MD;  Location: [place];  Service: Radiology;  Laterality: N/A;   TEE WITHOUT CARDIOVERSION N/A 04/24/2021   Procedure: TRANSESOPHAGEAL ECHOCARDIOGRAM (TEE);  Surgeon: Sueanne Margarita, MD;  Location: [place];  Service: Cardiovascular;  Laterality: N/A;   TOTAL HIP  ARTHROPLASTY Right 04/04/2018   Procedure: TOTAL HIP ARTHROPLASTY;  Surgeon: Dereck Leep, MD;  Location: [facility];  Service: Orthopedics;  Laterality: Right;    Family History  Problem Relation Age of Onset   Bone cancer Brother    Aneurysm Mother        brain   Diabetes Father    CAD Father    Heart failure Father    Colon cancer Father    Cancer Father    Alzheimer's disease Paternal Grandmother    Dementia Paternal Grandmother    Mental illness Paternal Grandfather    Healthy Daughter     Healthy Son    Breast cancer Neg Hx     Social History Social History   Tobacco Use   Smoking status: Former    Packs/day: 1.50    Years: 25.00    Pack years: 37.50    Types: Cigarettes    Quit date: 03/24/2007    Years since quitting: 14.3   Smokeless tobacco: Never  Vaping Use   Vaping Use: Never used  Substance Use Topics   Alcohol use: Yes    Alcohol/week: 0.0 standard drinks    Comment: 1- every 3-4 months   Drug use: No    Current Outpatient Medications  Medication Sig Dispense Refill   acetaminophen (TYLENOL) 325 MG tablet Take 2 tablets (650 mg total) by mouth every 4 (four) hours as needed for mild pain (or temp > 37.5 C (99.5 F)).     ALPRAZolam (XANAX) 0.5 MG tablet TAKE 1 TABLET BY MOUTH 2 TIMES DAILY AS NEEDED FOR ANXIETY. 30 tablet 5   amLODipine (NORVASC) 10 MG tablet Take 1 tablet (10 mg total) by mouth at bedtime. 90 tablet 1   aspirin 81 MG chewable tablet Chew 1 tablet (81 mg total) by mouth daily.     atorvastatin (LIPITOR) 40 MG tablet Take 1 tablet (40 mg total) by mouth daily. 90 tablet 1   Blood Glucose Monitoring Suppl (FREESTYLE FREEDOM LITE) w/Device KIT USE AS DIRECTED 1 kit 0   diclofenac Sodium (VOLTAREN) 1 % GEL Apply 2 g topically 4 (four) times daily as needed (pain). 100 g 0   gabapentin (NEURONTIN) 300 MG capsule Take 1 capsule (300 mg total) by mouth at bedtime. For neuropathic pain 30 capsule 2   glucose blood (FREESTYLE LITE) test strip Also needs Lancets. Use to check blood sugar up to four times a day for insulin dependant diabetes 100 each 12   glyBURIDE (DIABETA) 5 MG tablet Take 1 tablet (5 mg total) by mouth daily with breakfast. 90 tablet 1   hydrALAZINE (APRESOLINE) 10 MG tablet Take 1 tablet (10 mg total) by mouth at bedtime. 90 tablet 1   insulin glargine-yfgn (SEMGLEE, YFGN,) 100 UNIT/ML SOPN Inject 10 Units into the skin 2 (two) times daily. 15 mL 1   Insulin Glargine-yfgn (SEMGLEE, YFGN,) 100 UNIT/ML SOPN Inject 20 Units into  the skin every evening.     Insulin Pen Needle (UNIFINE PENTIPS) 31G X 6 MM MISC use with saxenda twice daily 100 each 0   Lancets (FREESTYLE) lancets USE TO CHECK BLOOD SUGAR UP TO 4 TIMES A DAY 100 each 3   losartan (COZAAR) 50 MG tablet Take 1 tablet (50 mg total) by mouth 2 (two) times daily. 180 tablet 1   metFORMIN (GLUCOPHAGE) 500 MG tablet Take 2 tablets (1,000 mg total) by mouth 2 (two) times daily with a meal. 360 tablet 1   methocarbamol (ROBAXIN) 500  MG tablet Take 1 tablet (500 mg total) by mouth 2 (two) times daily as needed for muscle spasms. 60 tablet 0   Multiple Vitamin (MULTIVITAMIN) tablet Take 1 tablet by mouth daily.     ondansetron (ZOFRAN ODT) 4 MG disintegrating tablet Take 1 tablet (4 mg total) by mouth every 8 (eight) hours as needed for nausea or vomiting. 20 tablet 2   pantoprazole (PROTONIX) 40 MG tablet Take 1 tablet (40 mg total) by mouth at bedtime. 90 tablet 1   rOPINIRole (REQUIP) 0.5 MG tablet Take 1 tablet (0.5 mg total) by mouth at bedtime. 90 tablet 1   saccharomyces boulardii (FLORASTOR) 250 MG capsule Take 1 capsule (250 mg total) by mouth 2 (two) times daily. 28 capsule 8   senna-docusate (SENOKOT-S) 8.6-50 MG tablet Take 2 tablets by mouth at bedtime.     sertraline (ZOLOFT) 100 MG tablet Take 1 tablet (100 mg total) by mouth daily. 30 tablet 0   No current facility-administered medications for this visit.    Allergies  Allergen Reactions   Jardiance [Empagliflozin] Itching    Recurrent mycotic infections   Penicillins Rash and Other (See Comments)    Has patient had a PCN reaction causing immediate rash, facial/tongue/throat swelling, SOB or lightheadedness with hypotension: No Has patient had a PCN reaction causing severe rash involving mucus membranes or skin necrosis: No Has patient had a PCN reaction that required hospitalization No Has patient had a PCN reaction occurring within the last 10 years: No If all of the above answers are "NO", then  may proceed with Cephalosporin use.     Review of Systems  Constitutional:  Negative for activity change and unexpected weight change.  Respiratory:  Negative for chest tightness and shortness of breath.   Cardiovascular:  Negative for chest pain and leg swelling.  Musculoskeletal:  Positive for arthralgias and gait problem.  Neurological:  Positive for dizziness and weakness (See HPI).  Hematological:  Negative for adenopathy. Does not bruise/bleed easily.  Psychiatric/Behavioral:  The patient is nervous/anxious.    BP 140/80   Pulse 83   Resp 20   Ht 5' 7"  (1.702 m)   Wt 200 lb (90.7 kg)   SpO2 96% Comment: RA  BMI 31.32 kg/m  Physical Exam Vitals reviewed.  Constitutional:      General: She is not in acute distress.    Appearance: Normal appearance.  HENT:     Head: Normocephalic and atraumatic.  Eyes:     General: No scleral icterus.    Extraocular Movements: Extraocular movements intact.  Neck:     Vascular: No carotid bruit.  Cardiovascular:     Rate and Rhythm: Normal rate and regular rhythm.     Heart sounds: Normal heart sounds. No murmur heard. Pulmonary:     Effort: Pulmonary effort is normal. No respiratory distress.     Breath sounds: Normal breath sounds. No wheezing.  Abdominal:     General: There is no distension.     Palpations: Abdomen is soft.  Musculoskeletal:        General: No swelling.  Skin:    General: Skin is warm and dry.  Neurological:     General: No focal deficit present.     Mental Status: She is alert and oriented to person, place, and time.     Motor: Weakness (Left grip 4 out of 5) present.     Diagnostic Tests: Transesophageal echocardiogram 04/24/2021 IMPRESSIONS     1. Left ventricular ejection fraction,  by estimation, is 60 to 65%. The  left ventricle has normal function. The left ventricle has no regional  wall motion abnormalities.   2. Prominent papillary muscle is noted. Right ventricular systolic  function is  normal. The right ventricular size is normal.   3. Left atrial size was mildly dilated. No left atrial/left atrial  appendage thrombus was detected.   4. The mitral valve is normal in structure. No evidence of mitral valve  regurgitation. No evidence of mitral stenosis.   5. The aortic valve is abnormal. Trileaflet aortic valve with large well  defined shimmering highly mobile mass on the aortic side of the valve. 3D  images obtained to try to delineate the origin of the mass as difficult to  discern whether this is  originating from the valve leaflet or the aortic wall. 3D images  suggestive of origin of mass off the aortic valve cusp and most consistent  with papillary fibroelastoma. There is no significant AI. Aortic valve  regurgitation is not visualized. No aortic  stenosis is present.   6. The inferior vena cava is normal in size with greater than 50%  respiratory variability, suggesting right atrial pressure of 3 mmHg.   7. Agitated saline contrast bubble study was negative, with no evidence  of any interatrial shunt.   Coronary CT 04/25/2021 Coronary Arteries:  Normal coronary origin.  Right dominance.   Coronary Calcium Score:   Left main: 0   Left anterior descending artery: 851   Left circumflex artery: 749   Right coronary artery: 304   Total: 1904   Percentile: 99th for age, sex, and race matched control.   RCA is a large dominant artery that gives rise to PDA and PLA. There is a mild non-obstructive (25-49%) calcified lesion in the proximal RCA. There is a moderate stenosis mixed plaque with a napkin ring sign in the mRCA (50-70%). There are many minimal non-obstructive calcific lesions throughout the vessel. There appears to be a severe stenosis (70-99%) calcified lesion in the distal RCA.   Left main is a large artery that gives rise to LAD and LCX arteries. There is no plaque.   LAD is a large vessel that gives rise to two large diagonal branches. There  is a 22 mm long calcific lesion that is mild non-obstructive (25-49%) from the LAD ostium into the mLAD. In the mLAD, this becomes a moderate stenosis (50-70%). There is a moderate stenosis (50-70%) calcified lesion in the proximal D1. There is a mild non-obstructive (25-49%) calcified lesion in the proximal D2.   LCX is a non-dominant artery. There is a 11 mm long calcific lesion that is a moderate stenosis (50-70%) from the LCx ostium into the mLCx. In the [facility], this becomes a moderate stenosis (50-70% soft plaque). There is a minimal non-obstructive (1-24%) calcified lesions in the mid and distal vessel.   Other findings:   Normal pulmonary vein drainage into the left atrium.   Normal left atrial appendage without a thrombus.   Mild to moderate mitral annular calcification noted.   Extra-cardiac findings: See attached radiology report for non-cardiac structures.   IMPRESSION: 1. Coronary calcium score of 1904. This was 99th percentile for age, sex, and race matched control.   2. Normal coronary origin with right dominance.   3. CAD-RADS 4 Severe stenosis. (70-99% or > 50% left main). CT FFR is recommended and will be sent. Consider symptom-guided anti-ischemic pharmacotherapy as well as risk factor modification per guideline directed care.  4.  Aortic atherosclerosis noted.   5. 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   6. Mild to moderate mitral annular calcification noted.  I personally reviewed the echo and coronary CT images.  Findings consistent with an aortic valve papillary fibroelastoma and three-vessel coronary disease.  Impression: [name] is a 65 year old woman with a medical history of coronary artery disease, fibroblastoma of the aortic valve, embolic right thalamic infarction, hypertension, hyperlipidemia, type 2 diabetes, anxiety, arthritis, reflux and  depression.  She was in her usual state of health until May when she suffered a stroke due to embolism of the right posterior cerebral artery.  She had a thalamic infarct with left-sided weakness and numbness.  She had some aphasia initially but that resolved rapidly.  During her work-up she was found to have a mobile mass arising from the aortic valve consistent with a fibroelastoma.  Surgical resection is indicated due to the risk of recurrent embolization.  She had a coronary CT which showed evidence of three-vessel coronary disease.  She should also undergo coronary bypass grafting at the time of resection of the fibroblastoma.  I do not think any additional information gleaned from cardiac catheterization would warrant the risk associated with potential for embolization from the tumor and would proceed based on the coronary CT findings.  I discussed the proposed operation with Mrs. Calarco and her son.  I informed them of the general nature of the procedure including the need for general anesthesia, the incisions to be used, the use of cardiopulmonary bypass, use of drains to postoperatively, the expected hospital stay, and the overall recovery.  She understands that we typically can remove these without having to replace the valve but there is always a possibility that she would need an aortic valve replacement.  Given her age a tissue valve would be appropriate should aortic valve replacement be necessary.  I informed them of the indications, risks, benefits, and alternatives.  They understand the risks include, but are not limited to death, MI, stroke, DVT, PE, bleeding, possible need for transfusion, infection, cardiac arrhythmias, as well as possibility of other organ system dysfunction such as respiratory, renal, or gastrointestinal.  She understands accepts the risks and agrees to proceed.  Plan: Resection of aortic valve fibroblastoma, possible aortic valve replacement, coronary artery bypass  grafting on Thursday, 07/17/2021  [name], MD [facility] [phone]

## 2021-07-10 NOTE — H&P (View-Only) (Signed)
PCP is Fisher, Kirstie Peri, MD Referring Provider is Gwyneth Sprout, FNP  Chief Complaint  Patient presents with   papillary fibroelastoma    Surgical consult, Cardiac CT and CTA Chest 04/25/21, TEE 04/24/21, ECHO 04/19/21    HPI: [name] is sent for consultation regarding a papillary fibroelastoma of the aortic valve and coronary artery disease.  [name] is a 65 year old woman with a medical history of coronary artery disease, fibroblastoma of the aortic valve, embolic right thalamic infarction, hypertension, hyperlipidemia, type 2 diabetes, anxiety, arthritis, reflux and depression.  [name] was in her usual state of health until May when [name] developed acute onset of dizziness, left-sided weakness, left-sided numbness, and slurred speech.  [name] was taken to the emergency room at [facility] as a code stroke.  Her speech improved rapidly.  Initial CT was negative.  [name] was hypertensive.  Neurology was consulted and [name] was treated with tPA.  An MRI showed a small right thalamic infarct and occlusion of the segmental branch of the right posterior cerebral artery.  [name] was transferred to [facility].  [name] underwent retrieval of the embolus by IR.  [name] had a TEE which showed normal left ventricular function with a large mobile mass on the aortic valve consistent with a papillary fibroblastoma.  There was no aortic stenosis or insufficiency.  [name] then went through inpatient rehab.  [name] currently is staying with her daughter.  [name] still have some numbness in her left arm greater than leg and also some weakness in her left hand.  Speech is normal and [name] is able to walk with a cane or walker.  [name] denied having any chest pain, pressure, tightness, or shortness of breath.   Past Medical History:  Diagnosis Date   Anxiety    Arthritis    Cataract    left   Depression    Diabetes mellitus without complication ([place])    Environmental and seasonal allergies    GERD (gastroesophageal reflux disease)     Hyperlipidemia    Hypertension    Joint pain    as reported by patient   Right thalamic infarction ([place]) 04/29/2021   Urinary incontinence    as stated by patient    Past Surgical History:  Procedure Laterality Date   BREAST EXCISIONAL BIOPSY Right 1999   neg   BREAST LUMPECTOMY Right 2005   as reported by patient   BUBBLE STUDY  04/24/2021   Procedure: BUBBLE STUDY;  Surgeon: Sueanne Margarita, MD;  Location: [place];  Service: Cardiovascular;;   CATARACT EXTRACTION  January 2013   CATARACT EXTRACTION W/PHACO Left 12/02/2016   Procedure: CATARACT EXTRACTION PHACO AND INTRAOCULAR LENS PLACEMENT ([place]);  Surgeon: Estill Cotta, MD;  Location: [facility];  Service: Ophthalmology;  Laterality: Left;  [place] 2:14 AP% 28.4 CDE 59.73 Fluid pack lot # [id number]   DIAGNOSTIC LAPAROSCOPY     IR CT HEAD LTD  04/19/2021   IR PERCUTANEOUS ART THROMBECTOMY/INFUSION INTRACRANIAL INC DIAG ANGIO  04/19/2021       IR PERCUTANEOUS ART THROMBECTOMY/INFUSION INTRACRANIAL INC DIAG ANGIO  04/19/2021   IR US GUIDE VASC ACCESS LEFT  04/19/2021   RADIOLOGY WITH ANESTHESIA N/A 04/19/2021   Procedure: IR WITH ANESTHESIA;  Surgeon: Luanne Bras, MD;  Location: [place];  Service: Radiology;  Laterality: N/A;   TEE WITHOUT CARDIOVERSION N/A 04/24/2021   Procedure: TRANSESOPHAGEAL ECHOCARDIOGRAM (TEE);  Surgeon: Sueanne Margarita, MD;  Location: [place];  Service: Cardiovascular;  Laterality: N/A;   TOTAL HIP  ARTHROPLASTY Right 04/04/2018   Procedure: TOTAL HIP ARTHROPLASTY;  Surgeon: Dereck Leep, MD;  Location: [facility];  Service: Orthopedics;  Laterality: Right;    Family History  Problem Relation Age of Onset   Bone cancer Brother    Aneurysm Mother        brain   Diabetes Father    CAD Father    Heart failure Father    Colon cancer Father    Cancer Father    Alzheimer's disease Paternal Grandmother    Dementia Paternal Grandmother    Mental illness Paternal Grandfather    Healthy Daughter     Healthy Son    Breast cancer Neg Hx     Social History Social History   Tobacco Use   Smoking status: Former    Packs/day: 1.50    Years: 25.00    Pack years: 37.50    Types: Cigarettes    Quit date: 03/24/2007    Years since quitting: 14.3   Smokeless tobacco: Never  Vaping Use   Vaping Use: Never used  Substance Use Topics   Alcohol use: Yes    Alcohol/week: 0.0 standard drinks    Comment: 1- every 3-4 months   Drug use: No    Current Outpatient Medications  Medication Sig Dispense Refill   acetaminophen (TYLENOL) 325 MG tablet Take 2 tablets (650 mg total) by mouth every 4 (four) hours as needed for mild pain (or temp > 37.5 C (99.5 F)).     ALPRAZolam (XANAX) 0.5 MG tablet TAKE 1 TABLET BY MOUTH 2 TIMES DAILY AS NEEDED FOR ANXIETY. 30 tablet 5   amLODipine (NORVASC) 10 MG tablet Take 1 tablet (10 mg total) by mouth at bedtime. 90 tablet 1   aspirin 81 MG chewable tablet Chew 1 tablet (81 mg total) by mouth daily.     atorvastatin (LIPITOR) 40 MG tablet Take 1 tablet (40 mg total) by mouth daily. 90 tablet 1   Blood Glucose Monitoring Suppl (FREESTYLE FREEDOM LITE) w/Device KIT USE AS DIRECTED 1 kit 0   diclofenac Sodium (VOLTAREN) 1 % GEL Apply 2 g topically 4 (four) times daily as needed (pain). 100 g 0   gabapentin (NEURONTIN) 300 MG capsule Take 1 capsule (300 mg total) by mouth at bedtime. For neuropathic pain 30 capsule 2   glucose blood (FREESTYLE LITE) test strip Also needs Lancets. Use to check blood sugar up to four times a day for insulin dependant diabetes 100 each 12   glyBURIDE (DIABETA) 5 MG tablet Take 1 tablet (5 mg total) by mouth daily with breakfast. 90 tablet 1   hydrALAZINE (APRESOLINE) 10 MG tablet Take 1 tablet (10 mg total) by mouth at bedtime. 90 tablet 1   insulin glargine-yfgn (SEMGLEE, YFGN,) 100 UNIT/ML SOPN Inject 10 Units into the skin 2 (two) times daily. 15 mL 1   Insulin Glargine-yfgn (SEMGLEE, YFGN,) 100 UNIT/ML SOPN Inject 20 Units into  the skin every evening.     Insulin Pen Needle (UNIFINE PENTIPS) 31G X 6 MM MISC use with saxenda twice daily 100 each 0   Lancets (FREESTYLE) lancets USE TO CHECK BLOOD SUGAR UP TO 4 TIMES A DAY 100 each 3   losartan (COZAAR) 50 MG tablet Take 1 tablet (50 mg total) by mouth 2 (two) times daily. 180 tablet 1   metFORMIN (GLUCOPHAGE) 500 MG tablet Take 2 tablets (1,000 mg total) by mouth 2 (two) times daily with a meal. 360 tablet 1   methocarbamol (ROBAXIN) 500  MG tablet Take 1 tablet (500 mg total) by mouth 2 (two) times daily as needed for muscle spasms. 60 tablet 0   Multiple Vitamin (MULTIVITAMIN) tablet Take 1 tablet by mouth daily.     ondansetron (ZOFRAN ODT) 4 MG disintegrating tablet Take 1 tablet (4 mg total) by mouth every 8 (eight) hours as needed for nausea or vomiting. 20 tablet 2   pantoprazole (PROTONIX) 40 MG tablet Take 1 tablet (40 mg total) by mouth at bedtime. 90 tablet 1   rOPINIRole (REQUIP) 0.5 MG tablet Take 1 tablet (0.5 mg total) by mouth at bedtime. 90 tablet 1   saccharomyces boulardii (FLORASTOR) 250 MG capsule Take 1 capsule (250 mg total) by mouth 2 (two) times daily. 28 capsule 8   senna-docusate (SENOKOT-S) 8.6-50 MG tablet Take 2 tablets by mouth at bedtime.     sertraline (ZOLOFT) 100 MG tablet Take 1 tablet (100 mg total) by mouth daily. 30 tablet 0   No current facility-administered medications for this visit.    Allergies  Allergen Reactions   Jardiance [Empagliflozin] Itching    Recurrent mycotic infections   Penicillins Rash and Other (See Comments)    Has patient had a PCN reaction causing immediate rash, facial/tongue/throat swelling, SOB or lightheadedness with hypotension: No Has patient had a PCN reaction causing severe rash involving mucus membranes or skin necrosis: No Has patient had a PCN reaction that required hospitalization No Has patient had a PCN reaction occurring within the last 10 years: No If all of the above answers are "NO", then  may proceed with Cephalosporin use.     Review of Systems  Constitutional:  Negative for activity change and unexpected weight change.  Respiratory:  Negative for chest tightness and shortness of breath.   Cardiovascular:  Negative for chest pain and leg swelling.  Musculoskeletal:  Positive for arthralgias and gait problem.  Neurological:  Positive for dizziness and weakness (See HPI).  Hematological:  Negative for adenopathy. Does not bruise/bleed easily.  Psychiatric/Behavioral:  The patient is nervous/anxious.    BP 140/80   Pulse 83   Resp 20   Ht 5' 7"  (1.702 m)   Wt 200 lb (90.7 kg)   SpO2 96% Comment: RA  BMI 31.32 kg/m  Physical Exam Vitals reviewed.  Constitutional:      General: [name] is not in acute distress.    Appearance: Normal appearance.  HENT:     Head: Normocephalic and atraumatic.  Eyes:     General: No scleral icterus.    Extraocular Movements: Extraocular movements intact.  Neck:     Vascular: No carotid bruit.  Cardiovascular:     Rate and Rhythm: Normal rate and regular rhythm.     Heart sounds: Normal heart sounds. No murmur heard. Pulmonary:     Effort: Pulmonary effort is normal. No respiratory distress.     Breath sounds: Normal breath sounds. No wheezing.  Abdominal:     General: There is no distension.     Palpations: Abdomen is soft.  Musculoskeletal:        General: No swelling.  Skin:    General: Skin is warm and dry.  Neurological:     General: No focal deficit present.     Mental Status: [name] is alert and oriented to person, place, and time.     Motor: Weakness (Left grip 4 out of 5) present.     Diagnostic Tests: Transesophageal echocardiogram 04/24/2021 IMPRESSIONS     1. Left ventricular ejection fraction,  by estimation, is 60 to 65%. The  left ventricle has normal function. The left ventricle has no regional  wall motion abnormalities.   2. Prominent papillary muscle is noted. Right ventricular systolic  function is  normal. The right ventricular size is normal.   3. Left atrial size was mildly dilated. No left atrial/left atrial  appendage thrombus was detected.   4. The mitral valve is normal in structure. No evidence of mitral valve  regurgitation. No evidence of mitral stenosis.   5. The aortic valve is abnormal. Trileaflet aortic valve with large well  defined shimmering highly mobile mass on the aortic side of the valve. 3D  images obtained to try to delineate the origin of the mass as difficult to  discern whether this is  originating from the valve leaflet or the aortic wall. 3D images  suggestive of origin of mass off the aortic valve cusp and most consistent  with papillary fibroelastoma. There is no significant AI. Aortic valve  regurgitation is not visualized. No aortic  stenosis is present.   6. The inferior vena cava is normal in size with greater than 50%  respiratory variability, suggesting right atrial pressure of 3 mmHg.   7. Agitated saline contrast bubble study was negative, with no evidence  of any interatrial shunt.   Coronary CT 04/25/2021 Coronary Arteries:  Normal coronary origin.  Right dominance.   Coronary Calcium Score:   Left main: 0   Left anterior descending artery: 851   Left circumflex artery: 749   Right coronary artery: 304   Total: 1904   Percentile: 99th for age, sex, and race matched control.   RCA is a large dominant artery that gives rise to PDA and PLA. There is a mild non-obstructive (25-49%) calcified lesion in the proximal RCA. There is a moderate stenosis mixed plaque with a napkin ring sign in the mRCA (50-70%). There are many minimal non-obstructive calcific lesions throughout the vessel. There appears to be a severe stenosis (70-99%) calcified lesion in the distal RCA.   Left main is a large artery that gives rise to LAD and LCX arteries. There is no plaque.   LAD is a large vessel that gives rise to two large diagonal branches. There  is a 22 mm long calcific lesion that is mild non-obstructive (25-49%) from the LAD ostium into the mLAD. In the mLAD, this becomes a moderate stenosis (50-70%). There is a moderate stenosis (50-70%) calcified lesion in the proximal D1. There is a mild non-obstructive (25-49%) calcified lesion in the proximal D2.   LCX is a non-dominant artery. There is a 11 mm long calcific lesion that is a moderate stenosis (50-70%) from the LCx ostium into the mLCx. In the [facility], this becomes a moderate stenosis (50-70% soft plaque). There is a minimal non-obstructive (1-24%) calcified lesions in the mid and distal vessel.   Other findings:   Normal pulmonary vein drainage into the left atrium.   Normal left atrial appendage without a thrombus.   Mild to moderate mitral annular calcification noted.   Extra-cardiac findings: See attached radiology report for non-cardiac structures.   IMPRESSION: 1. Coronary calcium score of 1904. This was 99th percentile for age, sex, and race matched control.   2. Normal coronary origin with right dominance.   3. CAD-RADS 4 Severe stenosis. (70-99% or > 50% left main). CT FFR is recommended and will be sent. Consider symptom-guided anti-ischemic pharmacotherapy as well as risk factor modification per guideline directed care.  4.  Aortic atherosclerosis noted.   5. 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   6. Mild to moderate mitral annular calcification noted.  I personally reviewed the echo and coronary CT images.  Findings consistent with an aortic valve papillary fibroelastoma and three-vessel coronary disease.  Impression: [name] is a 65 year old woman with a medical history of coronary artery disease, fibroblastoma of the aortic valve, embolic right thalamic infarction, hypertension, hyperlipidemia, type 2 diabetes, anxiety, arthritis, reflux and  depression.  [name] was in her usual state of health until May when [name] suffered a stroke due to embolism of the right posterior cerebral artery.  [name] had a thalamic infarct with left-sided weakness and numbness.  [name] had some aphasia initially but that resolved rapidly.  During her work-up [name] was found to have a mobile mass arising from the aortic valve consistent with a fibroelastoma.  Surgical resection is indicated due to the risk of recurrent embolization.  [name] had a coronary CT which showed evidence of three-vessel coronary disease.  [name] should also undergo coronary bypass grafting at the time of resection of the fibroblastoma.  I do not think any additional information gleaned from cardiac catheterization would warrant the risk associated with potential for embolization from the tumor and would proceed based on the coronary CT findings.  I discussed the proposed operation with [name] and her son.  I informed them of the general nature of the procedure including the need for general anesthesia, the incisions to be used, the use of cardiopulmonary bypass, use of drains to postoperatively, the expected hospital stay, and the overall recovery.  [name] understands that we typically can remove these without having to replace the valve but there is always a possibility that [name] would need an aortic valve replacement.  Given her age a tissue valve would be appropriate should aortic valve replacement be necessary.  I informed them of the indications, risks, benefits, and alternatives.  They understand the risks include, but are not limited to death, MI, stroke, DVT, PE, bleeding, possible need for transfusion, infection, cardiac arrhythmias, as well as possibility of other organ system dysfunction such as respiratory, renal, or gastrointestinal.  [name] understands accepts the risks and agrees to proceed.  Plan: Resection of aortic valve fibroblastoma, possible aortic valve replacement, coronary artery bypass  grafting on Thursday, 07/17/2021  [name], MD [facility] [phone]

## 2021-07-11 ENCOUNTER — Other Ambulatory Visit: Payer: Self-pay | Admitting: *Deleted

## 2021-07-11 ENCOUNTER — Other Ambulatory Visit: Payer: Self-pay

## 2021-07-11 DIAGNOSIS — I2510 Atherosclerotic heart disease of native coronary artery without angina pectoris: Secondary | ICD-10-CM

## 2021-07-11 DIAGNOSIS — D151 Benign neoplasm of heart: Secondary | ICD-10-CM

## 2021-07-11 MED ORDER — CARESTART COVID-19 HOME TEST VI KIT
PACK | 0 refills | Status: DC
  Filled 2021-07-11: qty 2, 4d supply, fill #0

## 2021-07-14 ENCOUNTER — Telehealth: Payer: Self-pay

## 2021-07-14 NOTE — Telephone Encounter (Signed)
FYI....   Copied from [place] [id number]. Topic: General - Other >> Jul 11, 2021  3:30 PM Bayard Beaver wrote: Reason for CRM: tangi from [place] called in to inform patient missed her phys therapy appt with her. [phone]

## 2021-07-14 NOTE — Pre-Procedure Instructions (Addendum)
Surgical Instructions   Your procedure is scheduled on Thursday, August 4th, 2022 at 08:00 AM. Report to [facility] Main Entrance "A" at 06:00 A.M., then check in with the Admitting office. Call this number if you have problems the morning of surgery: [phone]   If you have any questions prior to your surgery date call [phone]: Open Monday-Friday 8am-4pm   Remember: Do not eat or drink after midnight the night before your surgery   Take these medicines the morning of surgery with A SIP OF WATER  atorvastatin (LIPITOR) sertraline (ZOLOFT)   If needed: acetaminophen (TYLENOL) ALPRAZolam (XANAX)  methocarbamol (ROBAXIN) ondansetron (ZOFRAN ODT)  WHAT DO I DO ABOUT MY DIABETES MEDICATION?   Wednesday 07/16/21:  Take usual dose of metFORMIN (GLUCOPHAGE) Take usual dose of glyBURIDE (DIABETA) Take usual morning dose of insulin glargine-yfgn (SEMGLEE, YFGN) Take 5 units (50%) of your evening dose of insulin glargine-yfgn (SEMGLEE, YFGN).  Thursday 07/17/21 (DAY OF SURGERY):  Do not take glyBURIDE (DIABETA) or metFORMIN (GLUCOPHAGE) the morning of surgery Take 5 units (50%) of insulin glargine-yfgn (SEMGLEE, YFGN) the morning of surgery     HOW TO MANAGE YOUR DIABETES BEFORE AND AFTER SURGERY  Why is it important to control my blood sugar before and after surgery? Improving blood sugar levels before and after surgery helps healing and can limit problems. A way of improving blood sugar control is eating a healthy diet by:  Eating less sugar and carbohydrates  Increasing activity/exercise  Talking with your doctor about reaching your blood sugar goals High blood sugars (greater than 180 mg/dL) can raise your risk of infections and slow your recovery, so you will need to focus on controlling your diabetes during the weeks before surgery. Make sure that the doctor who takes care of your diabetes knows about your planned surgery including the date and location.  How do I  manage my blood sugar before surgery? Check your blood sugar at least 4 times a day, starting 2 days before surgery, to make sure that the level is not too high or low.  Check your blood sugar the morning of your surgery when you wake up and every 2 hours until you get to the Short Stay unit.  If your blood sugar is less than 70 mg/dL, you will need to treat for low blood sugar: Do not take insulin. Treat a low blood sugar (less than 70 mg/dL) with  cup of clear juice (cranberry or apple), 4 glucose tablets, OR glucose gel. Recheck blood sugar in 15 minutes after treatment (to make sure it is greater than 70 mg/dL). If your blood sugar is not greater than 70 mg/dL on recheck, call [phone] for further instructions. Report your blood sugar to the short stay nurse when you get to Short Stay.  If you are admitted to the hospital after surgery: Your blood sugar will be checked by the staff and you will probably be given insulin after surgery (instead of oral diabetes medicines) to make sure you have good blood sugar levels. The goal for blood sugar control after surgery is 80-180 mg/dL.   As of today, STOP taking any Aspirin (unless otherwise instructed by your surgeon) Aleve, Naproxen, Ibuprofen, Motrin, Advil, Goody's, BC's, all herbal medications, fish oil, diclofenac Sodium (VOLTAREN)and all vitamins.          Do not wear jewelry or makeup Do not wear lotions, powders, perfumes, or deodorant. Do not shave 48 hours prior to surgery.   Do not bring valuables to  the hospital. [facility] is not responsible for any belongings or valuables. DO Not wear nail polish, gel polish, artificial nails, or any other type of covering on natural nails including finger and toenails. If patients have artificial nails, gel coating, etc. that need to be removed by a nail salon please have this removed prior to surgery or surgery may need to be canceled/delayed if the surgeon/ anesthesia feels like the patient  is unable to be adequately monitored.              Do NOT Smoke (Tobacco/Vaping) or drink Alcohol 24 hours prior to your procedure If you use a CPAP at night, you may bring all equipment for your overnight stay.   Contacts, glasses, dentures or bridgework may not be worn into surgery, please bring cases for these belongings   For patients admitted to the hospital, discharge time will be determined by your treatment team.   Patients discharged the day of surgery will not be allowed to drive home, and someone needs to stay with them for 24 hours.  ONLY 1 SUPPORT PERSON MAY BE PRESENT WHILE YOU ARE IN SURGERY. IF YOU ARE TO BE ADMITTED ONCE YOU ARE IN YOUR ROOM YOU WILL BE ALLOWED TWO (2) VISITORS.  Minor children may have two parents present. Special consideration for safety and communication needs will be reviewed on a case by case basis.  Special instructions:    Oral Hygiene is also important to reduce your risk of infection.  Remember - BRUSH YOUR TEETH THE MORNING OF SURGERY WITH YOUR REGULAR TOOTHPASTE   [place]- Preparing For Surgery  Before surgery, you can play an important role. Because skin is not sterile, your skin needs to be as free of germs as possible. You can reduce the number of germs on your skin by washing with CHG (chlorahexidine gluconate) Soap before surgery.  CHG is an antiseptic cleaner which kills germs and bonds with the skin to continue killing germs even after washing.     Please do not use if you have an allergy to CHG or antibacterial soaps. If your skin becomes reddened/irritated stop using the CHG.  Do not shave (including legs and underarms) for at least 48 hours prior to first CHG shower. It is OK to shave your face.  Please follow these instructions carefully.     Shower the NIGHT BEFORE SURGERY and the MORNING OF SURGERY with CHG Soap.   If you chose to wash your hair, wash your hair first as usual with your normal shampoo. After you shampoo,  rinse your hair and body thoroughly to remove the shampoo.  Then [employer] and genitals (private parts) with your normal soap and rinse thoroughly to remove soap.  After that Use CHG Soap as you would any other liquid soap. You can apply CHG directly to the skin and wash gently with a scrungie or a clean washcloth.   Apply the CHG Soap to your body ONLY FROM THE NECK DOWN.  Do not use on open wounds or open sores. Avoid contact with your eyes, ears, mouth and genitals (private parts). Wash Face and genitals (private parts)  with your normal soap.   Wash thoroughly, paying special attention to the area where your surgery will be performed.  Thoroughly rinse your body with warm water from the neck down.  DO NOT shower/wash with your normal soap after using and rinsing off the CHG Soap.  Pat yourself dry with a CLEAN TOWEL.  Wear CLEAN  PAJAMAS to bed the night before surgery  Place CLEAN SHEETS on your bed the night before your surgery  DO NOT SLEEP WITH PETS.   Day of Surgery: Take a shower with CHG soap. Wear Clean/Comfortable clothing the morning of surgery Do not apply any deodorants/lotions.   Remember to brush your teeth WITH YOUR REGULAR TOOTHPASTE.   Please read over the following fact sheets that you were given.

## 2021-07-15 ENCOUNTER — Encounter (HOSPITAL_COMMUNITY): Payer: Self-pay

## 2021-07-15 ENCOUNTER — Inpatient Hospital Stay: Payer: 59 | Admitting: Adult Health

## 2021-07-15 ENCOUNTER — Ambulatory Visit (HOSPITAL_COMMUNITY)
Admission: RE | Admit: 2021-07-15 | Discharge: 2021-07-15 | Disposition: A | Discharge status: Home / Self Care | Payer: 59 | Source: Ambulatory Visit | Attending: Thoracic Surgery (Cardiothoracic Vascular Surgery) | Admitting: Thoracic Surgery (Cardiothoracic Vascular Surgery)

## 2021-07-15 ENCOUNTER — Other Ambulatory Visit: Payer: Self-pay

## 2021-07-15 ENCOUNTER — Other Ambulatory Visit (HOSPITAL_COMMUNITY): Payer: 59

## 2021-07-15 ENCOUNTER — Encounter (HOSPITAL_COMMUNITY)
Admission: RE | Admit: 2021-07-15 | Discharge: 2021-07-15 | Disposition: A | Discharge status: Home / Self Care | Payer: 59 | Source: Ambulatory Visit | Attending: Thoracic Surgery (Cardiothoracic Vascular Surgery) | Admitting: Thoracic Surgery (Cardiothoracic Vascular Surgery)

## 2021-07-15 DIAGNOSIS — Z01818 Encounter for other preprocedural examination: Secondary | ICD-10-CM

## 2021-07-15 DIAGNOSIS — U071 COVID-19: Secondary | ICD-10-CM | POA: Diagnosis not present

## 2021-07-15 DIAGNOSIS — I10 Essential (primary) hypertension: Secondary | ICD-10-CM | POA: Insufficient documentation

## 2021-07-15 DIAGNOSIS — Z951 Presence of aortocoronary bypass graft: Secondary | ICD-10-CM | POA: Diagnosis not present

## 2021-07-15 DIAGNOSIS — I2510 Atherosclerotic heart disease of native coronary artery without angina pectoris: Secondary | ICD-10-CM | POA: Insufficient documentation

## 2021-07-15 DIAGNOSIS — E669 Obesity, unspecified: Secondary | ICD-10-CM | POA: Diagnosis not present

## 2021-07-15 DIAGNOSIS — Z87891 Personal history of nicotine dependence: Secondary | ICD-10-CM | POA: Diagnosis not present

## 2021-07-15 DIAGNOSIS — E785 Hyperlipidemia, unspecified: Secondary | ICD-10-CM | POA: Insufficient documentation

## 2021-07-15 DIAGNOSIS — D151 Benign neoplasm of heart: Secondary | ICD-10-CM | POA: Insufficient documentation

## 2021-07-15 DIAGNOSIS — Z8673 Personal history of transient ischemic attack (TIA), and cerebral infarction without residual deficits: Secondary | ICD-10-CM | POA: Insufficient documentation

## 2021-07-15 DIAGNOSIS — E1165 Type 2 diabetes mellitus with hyperglycemia: Secondary | ICD-10-CM | POA: Diagnosis not present

## 2021-07-15 DIAGNOSIS — E119 Type 2 diabetes mellitus without complications: Secondary | ICD-10-CM | POA: Diagnosis not present

## 2021-07-15 DIAGNOSIS — E7800 Pure hypercholesterolemia, unspecified: Secondary | ICD-10-CM | POA: Diagnosis not present

## 2021-07-15 DIAGNOSIS — K5901 Slow transit constipation: Secondary | ICD-10-CM | POA: Diagnosis not present

## 2021-07-15 DIAGNOSIS — F419 Anxiety disorder, unspecified: Secondary | ICD-10-CM | POA: Diagnosis not present

## 2021-07-15 DIAGNOSIS — I69354 Hemiplegia and hemiparesis following cerebral infarction affecting left non-dominant side: Secondary | ICD-10-CM | POA: Diagnosis not present

## 2021-07-15 DIAGNOSIS — F3341 Major depressive disorder, recurrent, in partial remission: Secondary | ICD-10-CM | POA: Diagnosis not present

## 2021-07-15 DIAGNOSIS — K219 Gastro-esophageal reflux disease without esophagitis: Secondary | ICD-10-CM | POA: Diagnosis not present

## 2021-07-15 LAB — APTT: aPTT: 29 seconds (ref 24–36)

## 2021-07-15 LAB — CBC
HCT: 42.3 % (ref 36.0–46.0)
Hemoglobin: 14.4 g/dL (ref 12.0–15.0)
MCH: 28.5 pg (ref 26.0–34.0)
MCHC: 34 g/dL (ref 30.0–36.0)
MCV: 83.8 fL (ref 80.0–100.0)
Platelets: 295 10*3/uL (ref 150–400)
RBC: 5.05 MIL/uL (ref 3.87–5.11)
RDW: 12.4 % (ref 11.5–15.5)
WBC: 5.6 10*3/uL (ref 4.0–10.5)
nRBC: 0 % (ref 0.0–0.2)

## 2021-07-15 LAB — TYPE AND SCREEN
ABO/RH(D): O POS
Antibody Screen: NEGATIVE

## 2021-07-15 LAB — HEMOGLOBIN A1C
Hgb A1c MFr Bld: 7.8 % — ABNORMAL HIGH (ref 4.8–5.6)
Mean Plasma Glucose: 177.16 mg/dL

## 2021-07-15 LAB — COMPREHENSIVE METABOLIC PANEL
ALT: 21 U/L (ref 0–44)
AST: 28 U/L (ref 15–41)
Albumin: 4.1 g/dL (ref 3.5–5.0)
Alkaline Phosphatase: 47 U/L (ref 38–126)
Anion gap: 14 (ref 5–15)
BUN: 8 mg/dL (ref 8–23)
CO2: 20 mmol/L — ABNORMAL LOW (ref 22–32)
Calcium: 9.1 mg/dL (ref 8.9–10.3)
Chloride: 98 mmol/L (ref 98–111)
Creatinine, Ser: 0.93 mg/dL (ref 0.44–1.00)
GFR, Estimated: >60 mL/min (ref >60)
Glucose, Bld: 162 mg/dL — ABNORMAL HIGH (ref 70–99)
Potassium: 4.2 mmol/L (ref 3.5–5.1)
Sodium: 132 mmol/L — ABNORMAL LOW (ref 135–145)
Total Bilirubin: 0.8 mg/dL (ref 0.3–1.2)
Total Protein: 7.9 g/dL (ref 6.5–8.1)

## 2021-07-15 LAB — BLOOD GAS, ARTERIAL
Acid-Base Excess: 0.2 mmol/L (ref 0.0–2.0)
Bicarbonate: 22.8 mmol/L (ref 20.0–28.0)
Drawn by: 58793
FIO2: 21
O2 Saturation: 99.1 %
Patient temperature: 37
pCO2 arterial: 27.6 mmHg — ABNORMAL LOW (ref 32.0–48.0)
pH, Arterial: 7.526 — ABNORMAL HIGH (ref 7.350–7.450)
pO2, Arterial: 127 mmHg — ABNORMAL HIGH (ref 83.0–108.0)

## 2021-07-15 LAB — URINALYSIS, ROUTINE W REFLEX MICROSCOPIC
Bilirubin Urine: NEGATIVE
Glucose, UA: NEGATIVE mg/dL
Ketones, ur: NEGATIVE mg/dL
Nitrite: NEGATIVE
Protein, ur: 30 mg/dL — AB
Specific Gravity, Urine: 1.016 (ref 1.005–1.030)
WBC, UA: >50 WBC/hpf — ABNORMAL HIGH (ref 0–5)
pH: 5 (ref 5.0–8.0)

## 2021-07-15 LAB — SARS CORONAVIRUS 2 (TAT 6-24 HRS): SARS Coronavirus 2: POSITIVE — AB

## 2021-07-15 LAB — PROTIME-INR
INR: 1 (ref 0.8–1.2)
Prothrombin Time: 13.3 seconds (ref 11.4–15.2)

## 2021-07-15 LAB — SURGICAL PCR SCREEN
MRSA, PCR: NEGATIVE
Staphylococcus aureus: POSITIVE — AB

## 2021-07-15 LAB — GLUCOSE, CAPILLARY: Glucose-Capillary: 177 mg/dL — ABNORMAL HIGH (ref 70–99)

## 2021-07-15 NOTE — Progress Notes (Signed)
PCP - Lelon Huh MD Cardiologist - Rudean Haskell, MD  PPM/ICD - denies  Chest x-ray - 07/15/21 EKG - 07/15/21 Stress Test - denies ECHO - 04/24/21 Cardiac Cath - denies  Sleep Study - denies  Fasting Blood Sugar -  100 Checks Blood Sugar approximately 3 times a day  Blood Thinner/Aspirin Instructions: Hold for 7 days   ERAS Protcol - No   COVID TEST- swabbed at PAT appt 07/15/21. Results pending   Anesthesia review: yes, cardiac history  Patient denies shortness of breath, fever, cough and chest pain at PAT appointment   All instructions explained to the patient, with a verbal understanding of the material. Patient agrees to go over the instructions while at home for a better understanding. The opportunity to ask questions was provided.

## 2021-07-15 NOTE — Progress Notes (Signed)
Left a message for Levonne Spiller re: UA results and A1c result '@[id number]'$ .

## 2021-07-16 ENCOUNTER — Other Ambulatory Visit: Payer: Self-pay | Admitting: *Deleted

## 2021-07-16 ENCOUNTER — Other Ambulatory Visit: Payer: Self-pay

## 2021-07-16 ENCOUNTER — Telehealth: Payer: 59 | Admitting: Family Medicine

## 2021-07-16 ENCOUNTER — Encounter: Payer: Self-pay | Admitting: *Deleted

## 2021-07-16 MED ORDER — INSULIN REGULAR(HUMAN) IN NACL 100-0.9 UT/100ML-% IV SOLN
INTRAVENOUS | Status: AC
  Filled 2021-07-16: qty 100

## 2021-07-16 MED ORDER — EPINEPHRINE HCL 5 MG/250ML IV SOLN IN NS
0.0000 ug/min | INTRAVENOUS | Status: AC
  Filled 2021-07-16: qty 250

## 2021-07-16 MED ORDER — DEXMEDETOMIDINE HCL IN NACL 400 MCG/100ML IV SOLN
0.1000 ug/kg/h | INTRAVENOUS | Status: AC
  Filled 2021-07-16: qty 100

## 2021-07-16 MED ORDER — VANCOMYCIN HCL 1500 MG/300ML IV SOLN
1500.0000 mg | INTRAVENOUS | Status: AC
  Filled 2021-07-16: qty 300

## 2021-07-16 MED ORDER — MAGNESIUM SULFATE 50 % IJ SOLN
40.0000 meq | INTRAMUSCULAR | Status: AC
  Filled 2021-07-16: qty 9.85

## 2021-07-16 MED ORDER — PLASMA-LYTE A IV SOLN
INTRAVENOUS | Status: AC
  Filled 2021-07-16: qty 5

## 2021-07-16 MED ORDER — CIPROFLOXACIN HCL 500 MG PO TABS
500.0000 mg | ORAL_TABLET | Freq: Two times a day (BID) | ORAL | 0 refills | Status: AC
  Filled 2021-07-16: qty 14, 7d supply, fill #0

## 2021-07-16 MED ORDER — MILRINONE LACTATE IN DEXTROSE 20-5 MG/100ML-% IV SOLN
0.3000 ug/kg/min | INTRAVENOUS | Status: AC
  Filled 2021-07-16: qty 100

## 2021-07-16 MED ORDER — CEFAZOLIN SODIUM-DEXTROSE 2-4 GM/100ML-% IV SOLN
2.0000 g | INTRAVENOUS | Status: AC
  Filled 2021-07-16: qty 100

## 2021-07-16 MED ORDER — POTASSIUM CHLORIDE 2 MEQ/ML IV SOLN
80.0000 meq | INTRAVENOUS | Status: AC
  Filled 2021-07-16: qty 40

## 2021-07-16 MED ORDER — TRANEXAMIC ACID (OHS) BOLUS VIA INFUSION
15.0000 mg/kg | INTRAVENOUS | Status: AC
  Filled 2021-07-16: qty 1314

## 2021-07-16 MED ORDER — SODIUM CHLORIDE 0.9 % IV SOLN
INTRAVENOUS | Status: AC
  Filled 2021-07-16: qty 30

## 2021-07-16 MED ORDER — PHENYLEPHRINE HCL-NACL 20-0.9 MG/250ML-% IV SOLN
30.0000 ug/min | INTRAVENOUS | Status: AC
  Filled 2021-07-16: qty 250

## 2021-07-16 MED ORDER — TRANEXAMIC ACID (OHS) PUMP PRIME SOLUTION
2.0000 mg/kg | INTRAVENOUS | Status: AC
  Filled 2021-07-16: qty 1.75

## 2021-07-16 MED ORDER — TRANEXAMIC ACID 1000 MG/10ML IV SOLN
1.5000 mg/kg/h | INTRAVENOUS | Status: AC
  Filled 2021-07-16: qty 25

## 2021-07-16 MED ORDER — NITROGLYCERIN IN D5W 200-5 MCG/ML-% IV SOLN
2.0000 ug/min | INTRAVENOUS | Status: AC
  Filled 2021-07-16: qty 250

## 2021-07-16 MED ORDER — NOREPINEPHRINE 4 MG/250ML-% IV SOLN
0.0000 ug/min | INTRAVENOUS | Status: AC
  Filled 2021-07-16: qty 250

## 2021-07-17 ENCOUNTER — Other Ambulatory Visit: Payer: Self-pay | Admitting: *Deleted

## 2021-07-17 DIAGNOSIS — D151 Benign neoplasm of heart: Secondary | ICD-10-CM

## 2021-07-17 DIAGNOSIS — I2510 Atherosclerotic heart disease of native coronary artery without angina pectoris: Secondary | ICD-10-CM

## 2021-07-22 ENCOUNTER — Telehealth: Payer: Self-pay

## 2021-07-22 NOTE — Telephone Encounter (Signed)
Copied from Christine [id number]. Topic: General - Other >> Jul 22, 2021 12:09 PM Leward Quan A wrote: Reason for CRM: Cornelius Moras with [facility] called in to report a missed OT visit with patient.  Ph# [phone]

## 2021-07-24 DIAGNOSIS — K5901 Slow transit constipation: Secondary | ICD-10-CM | POA: Diagnosis not present

## 2021-07-24 DIAGNOSIS — I69354 Hemiplegia and hemiparesis following cerebral infarction affecting left non-dominant side: Secondary | ICD-10-CM | POA: Diagnosis not present

## 2021-07-24 DIAGNOSIS — E7800 Pure hypercholesterolemia, unspecified: Secondary | ICD-10-CM | POA: Diagnosis not present

## 2021-07-24 DIAGNOSIS — E669 Obesity, unspecified: Secondary | ICD-10-CM | POA: Diagnosis not present

## 2021-07-24 DIAGNOSIS — K219 Gastro-esophageal reflux disease without esophagitis: Secondary | ICD-10-CM | POA: Diagnosis not present

## 2021-07-24 DIAGNOSIS — E1165 Type 2 diabetes mellitus with hyperglycemia: Secondary | ICD-10-CM | POA: Diagnosis not present

## 2021-07-24 DIAGNOSIS — F3341 Major depressive disorder, recurrent, in partial remission: Secondary | ICD-10-CM | POA: Diagnosis not present

## 2021-07-24 DIAGNOSIS — F419 Anxiety disorder, unspecified: Secondary | ICD-10-CM | POA: Diagnosis not present

## 2021-07-24 DIAGNOSIS — I10 Essential (primary) hypertension: Secondary | ICD-10-CM | POA: Diagnosis not present

## 2021-07-25 ENCOUNTER — Telehealth: Payer: Self-pay | Admitting: Family Medicine

## 2021-07-25 ENCOUNTER — Other Ambulatory Visit: Payer: Self-pay

## 2021-07-25 ENCOUNTER — Telehealth: Payer: Self-pay

## 2021-07-25 NOTE — Telephone Encounter (Signed)
Copied from [place] [id number]. Topic: Quick Communication - Home Health Verbal Orders >> Jul 25, 2021  1:40 PM Jodie Echevaria wrote: Caller/Agency: [facility] Number: [phone] ok to LM  Requesting OT/PT/Skilled Nursing/Social Work/Speech Therapy: OT  Frequency: 1 w 6

## 2021-07-25 NOTE — Telephone Encounter (Signed)
That's fine

## 2021-07-25 NOTE — Telephone Encounter (Signed)
Ok to order 

## 2021-07-25 NOTE — Telephone Encounter (Signed)
Melissa OT with [facility] is calling to report patient had missed OT visit today due to patient  is running errands

## 2021-07-25 NOTE — Telephone Encounter (Signed)
Please advise. Thanks.  

## 2021-07-25 NOTE — Telephone Encounter (Signed)
Copied from [place] [id number]. Topic: General - Inquiry >> Jul 25, 2021  9:47 AM Oneta Rack wrote: Caller name: Tan[place]  Relation to pt: PT from [facility]  Call back number: [phone]    Reason for call:  Requesting verbal orders 1x 2, once  every other  week for 6 weeks

## 2021-07-26 DIAGNOSIS — I69354 Hemiplegia and hemiparesis following cerebral infarction affecting left non-dominant side: Secondary | ICD-10-CM | POA: Diagnosis not present

## 2021-07-26 DIAGNOSIS — I639 Cerebral infarction, unspecified: Secondary | ICD-10-CM | POA: Diagnosis not present

## 2021-07-26 DIAGNOSIS — E1165 Type 2 diabetes mellitus with hyperglycemia: Secondary | ICD-10-CM | POA: Diagnosis not present

## 2021-07-29 ENCOUNTER — Telehealth: Payer: Self-pay

## 2021-07-29 NOTE — Telephone Encounter (Signed)
STD/FMLA forms competed and faxed to [employer] @ [phone]. Beginning leave 08/04/2021 through approx RTW date of 11/03/2021

## 2021-07-29 NOTE — Telephone Encounter (Signed)
I called [name] and advised her ok for Verbal orders.

## 2021-07-30 NOTE — Pre-Procedure Instructions (Signed)
Surgical Instructions   Your procedure is scheduled on 08/04/21  Report to [facility] Main Entrance "A" at 05:30AM, then check in with the Admitting office. Call this number if you have problems the morning of surgery: [phone]   If you have any questions prior to your surgery date call [phone]: Open Monday-Friday 8am-4pm   Remember: Do not eat or drink after midnight the night before your surgery   Take these medicines the morning of surgery with A SIP OF WATER  atorvastatin (LIPITOR) sertraline (ZOLOFT)   If needed: acetaminophen (TYLENOL) ALPRAZolam (XANAX)  methocarbamol (ROBAXIN) ondansetron (ZOFRAN ODT)  WHAT DO I DO ABOUT MY DIABETES MEDICATION?   08/03/21:  Take usual dose of metFORMIN (GLUCOPHAGE) Take usual dose of glyBURIDE (DIABETA) Take usual morning dose of insulin glargine-yfgn (SEMGLEE, YFGN) Take 5 units (50%) of your evening dose of insulin glargine-yfgn (SEMGLEE, YFGN).  08/04/21 (DAY OF SURGERY):  Do not take glyBURIDE (DIABETA) or metFORMIN (GLUCOPHAGE) the morning of surgery Take 5 units (50%) of insulin glargine-yfgn (SEMGLEE, YFGN) the morning of surgery     HOW TO MANAGE YOUR DIABETES BEFORE AND AFTER SURGERY  Why is it important to control my blood sugar before and after surgery? Improving blood sugar levels before and after surgery helps healing and can limit problems. A way of improving blood sugar control is eating a healthy diet by:  Eating less sugar and carbohydrates  Increasing activity/exercise  Talking with your doctor about reaching your blood sugar goals High blood sugars (greater than 180 mg/dL) can raise your risk of infections and slow your recovery, so you will need to focus on controlling your diabetes during the weeks before surgery. Make sure that the doctor who takes care of your diabetes knows about your planned surgery including the date and location.  How do I manage my blood sugar before surgery? Check your  blood sugar at least 4 times a day, starting 2 days before surgery, to make sure that the level is not too high or low.  Check your blood sugar the morning of your surgery when you wake up and every 2 hours until you get to the Short Stay unit.  If your blood sugar is less than 70 mg/dL, you will need to treat for low blood sugar: Do not take insulin. Treat a low blood sugar (less than 70 mg/dL) with  cup of clear juice (cranberry or apple), 4 glucose tablets, OR glucose gel. Recheck blood sugar in 15 minutes after treatment (to make sure it is greater than 70 mg/dL). If your blood sugar is not greater than 70 mg/dL on recheck, call [phone] for further instructions. Report your blood sugar to the short stay nurse when you get to Short Stay.  If you are admitted to the hospital after surgery: Your blood sugar will be checked by the staff and you will probably be given insulin after surgery (instead of oral diabetes medicines) to make sure you have good blood sugar levels. The goal for blood sugar control after surgery is 80-180 mg/dL.   As of today, STOP taking any Aspirin (unless otherwise instructed by your surgeon) Aleve, Naproxen, Ibuprofen, Motrin, Advil, Goody's, BC's, all herbal medications, fish oil, diclofenac Sodium (VOLTAREN)and all vitamins.          Do not wear jewelry or makeup Do not wear lotions, powders, perfumes, or deodorant. Do not shave 48 hours prior to surgery.   Do not bring valuables to the hospital. [facility] is not responsible for  any belongings or valuables.  DO Not wear nail polish, gel polish, artificial nails, or any other type of covering on natural nails including finger and toenails. If patients have artificial nails, gel coating, etc. that need to be removed by a nail salon please have this removed prior to surgery or surgery may need to be canceled/delayed if the surgeon/ anesthesia feels like the patient is unable to be adequately monitored.               Do NOT Smoke (Tobacco/Vaping) or drink Alcohol 24 hours prior to your procedure If you use a CPAP at night, you may bring all equipment for your overnight stay.   Contacts, glasses, dentures or bridgework may not be worn into surgery, please bring cases for these belongings   For patients admitted to the hospital, discharge time will be determined by your treatment team.   Patients discharged the day of surgery will not be allowed to drive home, and someone needs to stay with them for 24 hours.  ONLY 1 SUPPORT PERSON MAY BE PRESENT WHILE YOU ARE IN SURGERY. IF YOU ARE TO BE ADMITTED ONCE YOU ARE IN YOUR ROOM YOU WILL BE ALLOWED TWO (2) VISITORS.  Minor children may have two parents present. Special consideration for safety and communication needs will be reviewed on a case by case basis.  Special instructions:    Oral Hygiene is also important to reduce your risk of infection.  Remember - BRUSH YOUR TEETH THE MORNING OF SURGERY WITH YOUR REGULAR TOOTHPASTE   [facility]- Preparing For Surgery  Before surgery, you can play an important role. Because skin is not sterile, your skin needs to be as free of germs as possible. You can reduce the number of germs on your skin by washing with CHG (chlorahexidine gluconate) Soap before surgery.  CHG is an antiseptic cleaner which kills germs and bonds with the skin to continue killing germs even after washing.     Please do not use if you have an allergy to CHG or antibacterial soaps. If your skin becomes reddened/irritated stop using the CHG.  Do not shave (including legs and underarms) for at least 48 hours prior to first CHG shower. It is OK to shave your face.  Please follow these instructions carefully.     Shower the NIGHT BEFORE SURGERY and the MORNING OF SURGERY with CHG Soap.   If you chose to wash your hair, wash your hair first as usual with your normal shampoo. After you shampoo, rinse your hair and body thoroughly to remove the  shampoo.  Then [employer] and genitals (private parts) with your normal soap and rinse thoroughly to remove soap.  After that Use CHG Soap as you would any other liquid soap. You can apply CHG directly to the skin and wash gently with a scrungie or a clean washcloth.   Apply the CHG Soap to your body ONLY FROM THE NECK DOWN.  Do not use on open wounds or open sores. Avoid contact with your eyes, ears, mouth and genitals (private parts). Wash Face and genitals (private parts)  with your normal soap.   Wash thoroughly, paying special attention to the area where your surgery will be performed.  Thoroughly rinse your body with warm water from the neck down.  DO NOT shower/wash with your normal soap after using and rinsing off the CHG Soap.  Pat yourself dry with a CLEAN TOWEL.  Wear CLEAN PAJAMAS to bed the night before surgery  Place CLEAN SHEETS on your bed the night before your surgery  DO NOT SLEEP WITH PETS.   Day of Surgery: Take a shower with CHG soap. Wear Clean/Comfortable clothing the morning of surgery Do not apply any deodorants/lotions.   Remember to brush your teeth WITH YOUR REGULAR TOOTHPASTE.   Please read over the following fact sheets that you were given.

## 2021-07-31 ENCOUNTER — Encounter (HOSPITAL_COMMUNITY)
Admission: RE | Admit: 2021-07-31 | Discharge: 2021-07-31 | Disposition: A | Discharge status: Home / Self Care | Payer: 59 | Source: Ambulatory Visit | Attending: Thoracic Surgery (Cardiothoracic Vascular Surgery) | Admitting: Thoracic Surgery (Cardiothoracic Vascular Surgery)

## 2021-07-31 ENCOUNTER — Encounter (HOSPITAL_COMMUNITY): Payer: Self-pay

## 2021-07-31 ENCOUNTER — Other Ambulatory Visit: Payer: Self-pay

## 2021-07-31 DIAGNOSIS — D151 Benign neoplasm of heart: Secondary | ICD-10-CM | POA: Diagnosis not present

## 2021-07-31 DIAGNOSIS — Z01818 Encounter for other preprocedural examination: Secondary | ICD-10-CM | POA: Diagnosis not present

## 2021-07-31 DIAGNOSIS — I2510 Atherosclerotic heart disease of native coronary artery without angina pectoris: Secondary | ICD-10-CM | POA: Diagnosis not present

## 2021-07-31 LAB — PROTIME-INR
INR: 0.9 (ref 0.8–1.2)
Prothrombin Time: 12.2 seconds (ref 11.4–15.2)

## 2021-07-31 LAB — COMPREHENSIVE METABOLIC PANEL
ALT: 17 U/L (ref 0–44)
AST: 22 U/L (ref 15–41)
Albumin: 3.9 g/dL (ref 3.5–5.0)
Alkaline Phosphatase: 54 U/L (ref 38–126)
Anion gap: 12 (ref 5–15)
BUN: 5 mg/dL — ABNORMAL LOW (ref 8–23)
CO2: 21 mmol/L — ABNORMAL LOW (ref 22–32)
Calcium: 8.9 mg/dL (ref 8.9–10.3)
Chloride: 101 mmol/L (ref 98–111)
Creatinine, Ser: 0.77 mg/dL (ref 0.44–1.00)
GFR, Estimated: >60 mL/min (ref >60)
Glucose, Bld: 164 mg/dL — ABNORMAL HIGH (ref 70–99)
Potassium: 3.9 mmol/L (ref 3.5–5.1)
Sodium: 134 mmol/L — ABNORMAL LOW (ref 135–145)
Total Bilirubin: 0.7 mg/dL (ref 0.3–1.2)
Total Protein: 7.5 g/dL (ref 6.5–8.1)

## 2021-07-31 LAB — CBC
HCT: 40.9 % (ref 36.0–46.0)
Hemoglobin: 13.9 g/dL (ref 12.0–15.0)
MCH: 28.3 pg (ref 26.0–34.0)
MCHC: 34 g/dL (ref 30.0–36.0)
MCV: 83.1 fL (ref 80.0–100.0)
Platelets: 290 10*3/uL (ref 150–400)
RBC: 4.92 MIL/uL (ref 3.87–5.11)
RDW: 12.5 % (ref 11.5–15.5)
WBC: 8.8 10*3/uL (ref 4.0–10.5)
nRBC: 0 % (ref 0.0–0.2)

## 2021-07-31 LAB — URINALYSIS, ROUTINE W REFLEX MICROSCOPIC
Bilirubin Urine: NEGATIVE
Glucose, UA: NEGATIVE mg/dL
Hgb urine dipstick: NEGATIVE
Ketones, ur: NEGATIVE mg/dL
Nitrite: NEGATIVE
Protein, ur: NEGATIVE mg/dL
Specific Gravity, Urine: 1.01 (ref 1.005–1.030)
WBC, UA: >50 WBC/hpf — ABNORMAL HIGH (ref 0–5)
pH: 5 (ref 5.0–8.0)

## 2021-07-31 LAB — BLOOD GAS, ARTERIAL
Acid-base deficit: 0.4 mmol/L (ref 0.0–2.0)
Bicarbonate: 23.1 mmol/L (ref 20.0–28.0)
FIO2: 21
O2 Saturation: 98.5 %
Patient temperature: 37
pCO2 arterial: 33.6 mmHg (ref 32.0–48.0)
pH, Arterial: 7.452 — ABNORMAL HIGH (ref 7.350–7.450)
pO2, Arterial: 113 mmHg — ABNORMAL HIGH (ref 83.0–108.0)

## 2021-07-31 LAB — GLUCOSE, CAPILLARY: Glucose-Capillary: 168 mg/dL — ABNORMAL HIGH (ref 70–99)

## 2021-07-31 LAB — TYPE AND SCREEN
ABO/RH(D): O POS
Antibody Screen: NEGATIVE

## 2021-07-31 LAB — HEMOGLOBIN A1C
Hgb A1c MFr Bld: 7.8 % — ABNORMAL HIGH (ref 4.8–5.6)
Mean Plasma Glucose: 177.16 mg/dL

## 2021-07-31 LAB — APTT: aPTT: 29 seconds (ref 24–36)

## 2021-07-31 NOTE — Progress Notes (Addendum)
PCP: Lelon Huh, MD  Cardiologist: Rudean Haskell, MD  EKG: 07/15/21 CXR: 07/31/21 ECHO: 04/24/21 Stress Test: denies Cardiac Cath: denies  Fasting Blood Sugar- 100-170 Checks Blood Sugar__2_ times a day  OSA/CPAP: No  ASA/Blood Thinner: Hold ASA 7 days.  PCR staph positive 07/15/21.  Profend DOS.  Covid positive 07/15/21.  No retest needed  Anesthesia Review: yes, cardiac history  Patient denies shortness of breath, fever, cough, and chest pain at PAT appointment.  Patient verbalized understanding of instructions provided today at the PAT appointment.  Patient asked to review instructions at home and day of surgery.

## 2021-08-01 ENCOUNTER — Other Ambulatory Visit: Payer: Self-pay

## 2021-08-01 ENCOUNTER — Telehealth: Payer: Self-pay | Admitting: Family Medicine

## 2021-08-01 DIAGNOSIS — N182 Chronic kidney disease, stage 2 (mild): Secondary | ICD-10-CM

## 2021-08-01 DIAGNOSIS — E1122 Type 2 diabetes mellitus with diabetic chronic kidney disease: Secondary | ICD-10-CM

## 2021-08-01 MED ORDER — POTASSIUM CHLORIDE 2 MEQ/ML IV SOLN
80.0000 meq | INTRAVENOUS | Status: DC
  Filled 2021-08-01: qty 40

## 2021-08-01 MED ORDER — VANCOMYCIN HCL 1250 MG/250ML IV SOLN
1250.0000 mg | INTRAVENOUS | Status: AC
  Administered 2021-08-04: 1250 mg via INTRAVENOUS
  Filled 2021-08-01: qty 250

## 2021-08-01 MED ORDER — SODIUM CHLORIDE 0.9 % IV SOLN
INTRAVENOUS | Status: DC
  Filled 2021-08-01: qty 30

## 2021-08-01 MED ORDER — INSULIN GLARGINE-YFGN 100 UNIT/ML ~~LOC~~ SOPN
20.0000 [IU] | PEN_INJECTOR | Freq: Every evening | SUBCUTANEOUS | 1 refills | Status: DC
  Filled 2021-08-01: qty 15, 75d supply, fill #0

## 2021-08-01 MED ORDER — EPINEPHRINE HCL 5 MG/250ML IV SOLN IN NS
0.0000 ug/min | INTRAVENOUS | Status: DC
  Filled 2021-08-01: qty 250

## 2021-08-01 MED ORDER — PHENYLEPHRINE HCL-NACL 20-0.9 MG/250ML-% IV SOLN
30.0000 ug/min | INTRAVENOUS | Status: AC
  Administered 2021-08-04: 25 ug/min via INTRAVENOUS
  Filled 2021-08-01: qty 250

## 2021-08-01 MED ORDER — MILRINONE LACTATE IN DEXTROSE 20-5 MG/100ML-% IV SOLN
0.3000 ug/kg/min | INTRAVENOUS | Status: DC
  Filled 2021-08-01: qty 100

## 2021-08-01 MED ORDER — NITROGLYCERIN IN D5W 200-5 MCG/ML-% IV SOLN
2.0000 ug/min | INTRAVENOUS | Status: AC
Start: 2021-08-04 — End: 2021-08-04
  Administered 2021-08-04: 10 ug/min via INTRAVENOUS
  Filled 2021-08-01: qty 250

## 2021-08-01 MED ORDER — PLASMA-LYTE A IV SOLN
INTRAVENOUS | Status: DC
  Filled 2021-08-01: qty 5

## 2021-08-01 MED ORDER — DEXMEDETOMIDINE HCL IN NACL 400 MCG/100ML IV SOLN
0.1000 ug/kg/h | INTRAVENOUS | Status: AC
  Administered 2021-08-04: .7 ug/kg/h via INTRAVENOUS
  Filled 2021-08-01: qty 100

## 2021-08-01 MED ORDER — MAGNESIUM SULFATE 50 % IJ SOLN
40.0000 meq | INTRAMUSCULAR | Status: DC
  Filled 2021-08-01: qty 9.85

## 2021-08-01 MED ORDER — TRANEXAMIC ACID (OHS) BOLUS VIA INFUSION
15.0000 mg/kg | INTRAVENOUS | Status: AC
  Administered 2021-08-04: 1086 mg via INTRAVENOUS
  Filled 2021-08-01: qty 1086

## 2021-08-01 MED ORDER — CEFAZOLIN SODIUM-DEXTROSE 2-4 GM/100ML-% IV SOLN
2.0000 g | INTRAVENOUS | Status: AC
  Administered 2021-08-04 (×2): 2 g via INTRAVENOUS
  Filled 2021-08-01: qty 100

## 2021-08-01 MED ORDER — CEFAZOLIN SODIUM-DEXTROSE 2-4 GM/100ML-% IV SOLN
2.0000 g | INTRAVENOUS | Status: DC
  Filled 2021-08-01: qty 100

## 2021-08-01 MED ORDER — INSULIN REGULAR(HUMAN) IN NACL 100-0.9 UT/100ML-% IV SOLN
INTRAVENOUS | Status: AC
  Administered 2021-08-04: 6 [IU]/h via INTRAVENOUS
  Filled 2021-08-01: qty 100

## 2021-08-01 MED ORDER — NOREPINEPHRINE 4 MG/250ML-% IV SOLN
0.0000 ug/min | INTRAVENOUS | Status: DC
  Filled 2021-08-01: qty 250

## 2021-08-01 MED ORDER — TRANEXAMIC ACID (OHS) PUMP PRIME SOLUTION
2.0000 mg/kg | INTRAVENOUS | Status: DC
  Filled 2021-08-01: qty 1.77

## 2021-08-01 MED ORDER — TRANEXAMIC ACID 1000 MG/10ML IV SOLN
1.5000 mg/kg/h | INTRAVENOUS | Status: AC
  Administered 2021-08-04: 1.5 mg/kg/h via INTRAVENOUS
  Filled 2021-08-01: qty 25

## 2021-08-01 NOTE — Telephone Encounter (Signed)
Refill sent in to pharmacy 

## 2021-08-01 NOTE — Telephone Encounter (Signed)
Pt is having open heart surgery on Monday and needs a refill of her insulin / she had one that wasn't working and she threw it away instead of returning it to the pharmacy Insulin glargine-yfgn (SEMGLEE, YFGN,) 100 UNIT/ML SOPN  and now the pharmacy has advised to to have her PCP call in a script for one due to her not being able to get refill for another two weeks/ pt needs to take a certain amount before her surgery/ please send asap to  [facility] Phone:  [phone]  Fax:  [phone]

## 2021-08-03 NOTE — Anesthesia Preprocedure Evaluation (Addendum)
Anesthesia Evaluation  Patient identified by MRN, date of birth, ID band Patient awake    Reviewed: Allergy & Precautions, NPO status , Patient's Chart, lab work & pertinent test results  History of Anesthesia Complications Negative for: history of anesthetic complications  Airway Mallampati: II  TM Distance: >3 FB Neck ROM: Full    Dental  (+) Dental Advisory Given, Partial Lower   Pulmonary former smoker,    Pulmonary exam normal        Cardiovascular hypertension, Pt. on medications + CAD  Normal cardiovascular exam+ Valvular Problems/Murmurs    '22 Carotid US - 1-39% b/l ICAS  '22 TEE - EF 60 to 65%. Prominent papillary muscle is noted in RV. LA size was mildly dilated. Trileaflet aortic valve with large well defined shimmering highly mobile mass on the aortic side of the valve. 3D images obtained to try to delineate the origin of the mass as difficult to discern whether this is originating from the valve leaflet or the aortic wall. 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 No AS or AI.  '22 Coronary CT - Disease/stenosis noted mainly in RCA, LAD, and LCx    Neuro/Psych PSYCHIATRIC DISORDERS Anxiety Depression CVA (left sided weakness), Residual Symptoms    GI/Hepatic Neg liver ROS, GERD  Medicated and Controlled,  Endo/Other  diabetes, Type 2, Oral Hypoglycemic Agents, Insulin Dependent Obesity   Renal/GU negative Renal ROS  Female GU complaint     Musculoskeletal  (+) Arthritis ,   Abdominal   Peds  Hematology negative hematology ROS (+)   Anesthesia Other Findings   Reproductive/Obstetrics                            Anesthesia Physical Anesthesia Plan  ASA: 4  Anesthesia Plan: General   Post-op Pain Management:    Induction: Intravenous  PONV Risk Score and Plan: 3 and Treatment may vary due to age  or medical condition  Airway Management Planned: Oral ETT  Additional Equipment: Arterial line, CVP, PA Cath, TEE and Ultrasound Guidance Line Placement  Intra-op Plan:   Post-operative Plan: Post-operative intubation/ventilation  Informed Consent: I have reviewed the patients History and Physical, chart, labs and discussed the procedure including the risks, benefits and alternatives for the proposed anesthesia with the patient or authorized representative who has indicated his/her understanding and acceptance.     Dental advisory given  Plan Discussed with: CRNA and Anesthesiologist  Anesthesia Plan Comments:        Anesthesia Quick Evaluation

## 2021-08-04 ENCOUNTER — Inpatient Hospital Stay (HOSPITAL_COMMUNITY): Payer: 59 | Admitting: Certified Registered Nurse Anesthetist

## 2021-08-04 ENCOUNTER — Inpatient Hospital Stay (HOSPITAL_COMMUNITY): Payer: 59 | Admitting: Physician Assistant

## 2021-08-04 ENCOUNTER — Encounter (HOSPITAL_COMMUNITY): Payer: Self-pay | Admitting: Thoracic Surgery (Cardiothoracic Vascular Surgery)

## 2021-08-04 ENCOUNTER — Inpatient Hospital Stay (HOSPITAL_COMMUNITY): Payer: 59

## 2021-08-04 ENCOUNTER — Encounter (HOSPITAL_COMMUNITY)
Admission: RE | Disposition: A | Discharge status: Rehabilitation Facility | Payer: Self-pay | Source: Home / Self Care | Attending: Thoracic Surgery (Cardiothoracic Vascular Surgery)

## 2021-08-04 ENCOUNTER — Inpatient Hospital Stay (HOSPITAL_COMMUNITY)
Admission: RE | Admit: 2021-08-04 | Discharge: 2021-08-11 | DRG: 236 | Disposition: A | Discharge status: Rehabilitation Facility | Payer: 59 | Attending: Thoracic Surgery (Cardiothoracic Vascular Surgery) | Admitting: Thoracic Surgery (Cardiothoracic Vascular Surgery)

## 2021-08-04 DIAGNOSIS — E875 Hyperkalemia: Secondary | ICD-10-CM | POA: Diagnosis not present

## 2021-08-04 DIAGNOSIS — I10 Essential (primary) hypertension: Secondary | ICD-10-CM | POA: Diagnosis present

## 2021-08-04 DIAGNOSIS — G2581 Restless legs syndrome: Secondary | ICD-10-CM | POA: Diagnosis not present

## 2021-08-04 DIAGNOSIS — I340 Nonrheumatic mitral (valve) insufficiency: Secondary | ICD-10-CM | POA: Diagnosis not present

## 2021-08-04 DIAGNOSIS — E669 Obesity, unspecified: Secondary | ICD-10-CM | POA: Diagnosis present

## 2021-08-04 DIAGNOSIS — F419 Anxiety disorder, unspecified: Secondary | ICD-10-CM | POA: Diagnosis present

## 2021-08-04 DIAGNOSIS — Z7982 Long term (current) use of aspirin: Secondary | ICD-10-CM | POA: Diagnosis not present

## 2021-08-04 DIAGNOSIS — Z87891 Personal history of nicotine dependence: Secondary | ICD-10-CM | POA: Diagnosis not present

## 2021-08-04 DIAGNOSIS — E785 Hyperlipidemia, unspecified: Secondary | ICD-10-CM | POA: Diagnosis present

## 2021-08-04 DIAGNOSIS — E559 Vitamin D deficiency, unspecified: Secondary | ICD-10-CM | POA: Diagnosis present

## 2021-08-04 DIAGNOSIS — I4891 Unspecified atrial fibrillation: Secondary | ICD-10-CM | POA: Diagnosis not present

## 2021-08-04 DIAGNOSIS — I517 Cardiomegaly: Secondary | ICD-10-CM | POA: Diagnosis not present

## 2021-08-04 DIAGNOSIS — Z833 Family history of diabetes mellitus: Secondary | ICD-10-CM

## 2021-08-04 DIAGNOSIS — K219 Gastro-esophageal reflux disease without esophagitis: Secondary | ICD-10-CM | POA: Diagnosis present

## 2021-08-04 DIAGNOSIS — J90 Pleural effusion, not elsewhere classified: Secondary | ICD-10-CM | POA: Diagnosis not present

## 2021-08-04 DIAGNOSIS — E871 Hypo-osmolality and hyponatremia: Secondary | ICD-10-CM | POA: Diagnosis present

## 2021-08-04 DIAGNOSIS — Z7984 Long term (current) use of oral hypoglycemic drugs: Secondary | ICD-10-CM | POA: Diagnosis not present

## 2021-08-04 DIAGNOSIS — M1990 Unspecified osteoarthritis, unspecified site: Secondary | ICD-10-CM | POA: Diagnosis present

## 2021-08-04 DIAGNOSIS — I4892 Unspecified atrial flutter: Secondary | ICD-10-CM | POA: Diagnosis not present

## 2021-08-04 DIAGNOSIS — Z6832 Body mass index (BMI) 32.0-32.9, adult: Secondary | ICD-10-CM | POA: Diagnosis not present

## 2021-08-04 DIAGNOSIS — Z6830 Body mass index (BMI) 30.0-30.9, adult: Secondary | ICD-10-CM | POA: Diagnosis not present

## 2021-08-04 DIAGNOSIS — I69334 Monoplegia of upper limb following cerebral infarction affecting left non-dominant side: Secondary | ICD-10-CM

## 2021-08-04 DIAGNOSIS — R229 Localized swelling, mass and lump, unspecified: Secondary | ICD-10-CM | POA: Diagnosis not present

## 2021-08-04 DIAGNOSIS — I2510 Atherosclerotic heart disease of native coronary artery without angina pectoris: Secondary | ICD-10-CM | POA: Diagnosis present

## 2021-08-04 DIAGNOSIS — E8770 Fluid overload, unspecified: Secondary | ICD-10-CM | POA: Diagnosis not present

## 2021-08-04 DIAGNOSIS — M21332 Wrist drop, left wrist: Secondary | ICD-10-CM | POA: Diagnosis present

## 2021-08-04 DIAGNOSIS — D4989 Neoplasm of unspecified behavior of other specified sites: Secondary | ICD-10-CM | POA: Diagnosis not present

## 2021-08-04 DIAGNOSIS — I959 Hypotension, unspecified: Secondary | ICD-10-CM | POA: Diagnosis not present

## 2021-08-04 DIAGNOSIS — Y832 Surgical operation with anastomosis, bypass or graft as the cause of abnormal reaction of the patient, or of later complication, without mention of misadventure at the time of the procedure: Secondary | ICD-10-CM | POA: Diagnosis not present

## 2021-08-04 DIAGNOSIS — K5901 Slow transit constipation: Secondary | ICD-10-CM | POA: Diagnosis not present

## 2021-08-04 DIAGNOSIS — N179 Acute kidney failure, unspecified: Secondary | ICD-10-CM | POA: Diagnosis not present

## 2021-08-04 DIAGNOSIS — Z20822 Contact with and (suspected) exposure to covid-19: Secondary | ICD-10-CM | POA: Diagnosis present

## 2021-08-04 DIAGNOSIS — J9811 Atelectasis: Secondary | ICD-10-CM | POA: Diagnosis not present

## 2021-08-04 DIAGNOSIS — I700 Atherosclerosis of aorta: Secondary | ICD-10-CM | POA: Diagnosis present

## 2021-08-04 DIAGNOSIS — R5381 Other malaise: Secondary | ICD-10-CM | POA: Diagnosis present

## 2021-08-04 DIAGNOSIS — Z888 Allergy status to other drugs, medicaments and biological substances status: Secondary | ICD-10-CM

## 2021-08-04 DIAGNOSIS — Z800 Family history of malignant neoplasm of digestive organs: Secondary | ICD-10-CM

## 2021-08-04 DIAGNOSIS — Z96641 Presence of right artificial hip joint: Secondary | ICD-10-CM | POA: Diagnosis present

## 2021-08-04 DIAGNOSIS — R531 Weakness: Secondary | ICD-10-CM | POA: Diagnosis not present

## 2021-08-04 DIAGNOSIS — D151 Benign neoplasm of heart: Secondary | ICD-10-CM

## 2021-08-04 DIAGNOSIS — Z951 Presence of aortocoronary bypass graft: Secondary | ICD-10-CM | POA: Diagnosis not present

## 2021-08-04 DIAGNOSIS — Z79899 Other long term (current) drug therapy: Secondary | ICD-10-CM | POA: Diagnosis not present

## 2021-08-04 DIAGNOSIS — I9789 Other postprocedural complications and disorders of the circulatory system, not elsewhere classified: Secondary | ICD-10-CM | POA: Diagnosis not present

## 2021-08-04 DIAGNOSIS — F32A Depression, unspecified: Secondary | ICD-10-CM | POA: Diagnosis present

## 2021-08-04 DIAGNOSIS — Z880 Allergy status to penicillin: Secondary | ICD-10-CM

## 2021-08-04 DIAGNOSIS — Z820 Family history of epilepsy and other diseases of the nervous system: Secondary | ICD-10-CM

## 2021-08-04 DIAGNOSIS — I440 Atrioventricular block, first degree: Secondary | ICD-10-CM | POA: Diagnosis not present

## 2021-08-04 DIAGNOSIS — R509 Fever, unspecified: Secondary | ICD-10-CM | POA: Diagnosis not present

## 2021-08-04 DIAGNOSIS — Z794 Long term (current) use of insulin: Secondary | ICD-10-CM

## 2021-08-04 DIAGNOSIS — D62 Acute posthemorrhagic anemia: Secondary | ICD-10-CM | POA: Diagnosis not present

## 2021-08-04 DIAGNOSIS — B9561 Methicillin susceptible Staphylococcus aureus infection as the cause of diseases classified elsewhere: Secondary | ICD-10-CM | POA: Diagnosis not present

## 2021-08-04 DIAGNOSIS — Z8249 Family history of ischemic heart disease and other diseases of the circulatory system: Secondary | ICD-10-CM

## 2021-08-04 DIAGNOSIS — E11649 Type 2 diabetes mellitus with hypoglycemia without coma: Secondary | ICD-10-CM | POA: Diagnosis not present

## 2021-08-04 DIAGNOSIS — E1142 Type 2 diabetes mellitus with diabetic polyneuropathy: Secondary | ICD-10-CM | POA: Diagnosis present

## 2021-08-04 DIAGNOSIS — T8141XA Infection following a procedure, superficial incisional surgical site, initial encounter: Secondary | ICD-10-CM | POA: Diagnosis not present

## 2021-08-04 DIAGNOSIS — I69354 Hemiplegia and hemiparesis following cerebral infarction affecting left non-dominant side: Secondary | ICD-10-CM | POA: Diagnosis not present

## 2021-08-04 DIAGNOSIS — M79642 Pain in left hand: Secondary | ICD-10-CM | POA: Diagnosis present

## 2021-08-04 HISTORY — PX: AORTIC VALVE REPLACEMENT: SHX41

## 2021-08-04 HISTORY — PX: TEE WITHOUT CARDIOVERSION: SHX5443

## 2021-08-04 HISTORY — PX: CORONARY ARTERY BYPASS GRAFT: SHX141

## 2021-08-04 LAB — POCT I-STAT, CHEM 8
BUN: 5 mg/dL — ABNORMAL LOW (ref 8–23)
BUN: 5 mg/dL — ABNORMAL LOW (ref 8–23)
BUN: 4 mg/dL — ABNORMAL LOW (ref 8–23)
BUN: 5 mg/dL — ABNORMAL LOW (ref 8–23)
BUN: 5 mg/dL — ABNORMAL LOW (ref 8–23)
BUN: 4 mg/dL — ABNORMAL LOW (ref 8–23)
Calcium, Ion: 1.21 mmol/L (ref 1.15–1.40)
Calcium, Ion: 1.17 mmol/L (ref 1.15–1.40)
Calcium, Ion: 0.99 mmol/L — ABNORMAL LOW (ref 1.15–1.40)
Calcium, Ion: 0.98 mmol/L — ABNORMAL LOW (ref 1.15–1.40)
Calcium, Ion: 0.96 mmol/L — ABNORMAL LOW (ref 1.15–1.40)
Calcium, Ion: 0.98 mmol/L — ABNORMAL LOW (ref 1.15–1.40)
Chloride: 101 mmol/L (ref 98–111)
Chloride: 101 mmol/L (ref 98–111)
Chloride: 99 mmol/L (ref 98–111)
Chloride: 98 mmol/L (ref 98–111)
Chloride: 98 mmol/L (ref 98–111)
Chloride: 100 mmol/L (ref 98–111)
Creatinine, Ser: 0.6 mg/dL (ref 0.44–1.00)
Creatinine, Ser: 0.7 mg/dL (ref 0.44–1.00)
Creatinine, Ser: 0.5 mg/dL (ref 0.44–1.00)
Creatinine, Ser: 0.5 mg/dL (ref 0.44–1.00)
Creatinine, Ser: 0.5 mg/dL (ref 0.44–1.00)
Creatinine, Ser: 0.5 mg/dL (ref 0.44–1.00)
Glucose, Bld: 174 mg/dL — ABNORMAL HIGH (ref 70–99)
Glucose, Bld: 116 mg/dL — ABNORMAL HIGH (ref 70–99)
Glucose, Bld: 77 mg/dL (ref 70–99)
Glucose, Bld: 110 mg/dL — ABNORMAL HIGH (ref 70–99)
Glucose, Bld: 138 mg/dL — ABNORMAL HIGH (ref 70–99)
Glucose, Bld: 126 mg/dL — ABNORMAL HIGH (ref 70–99)
HCT: 35 % — ABNORMAL LOW (ref 36.0–46.0)
HCT: 33 % — ABNORMAL LOW (ref 36.0–46.0)
HCT: 26 % — ABNORMAL LOW (ref 36.0–46.0)
HCT: 27 % — ABNORMAL LOW (ref 36.0–46.0)
HCT: 27 % — ABNORMAL LOW (ref 36.0–46.0)
HCT: 29 % — ABNORMAL LOW (ref 36.0–46.0)
Hemoglobin: 11.9 g/dL — ABNORMAL LOW (ref 12.0–15.0)
Hemoglobin: 11.2 g/dL — ABNORMAL LOW (ref 12.0–15.0)
Hemoglobin: 8.8 g/dL — ABNORMAL LOW (ref 12.0–15.0)
Hemoglobin: 9.2 g/dL — ABNORMAL LOW (ref 12.0–15.0)
Hemoglobin: 9.2 g/dL — ABNORMAL LOW (ref 12.0–15.0)
Hemoglobin: 9.9 g/dL — ABNORMAL LOW (ref 12.0–15.0)
Potassium: 3.8 mmol/L (ref 3.5–5.1)
Potassium: 3.3 mmol/L — ABNORMAL LOW (ref 3.5–5.1)
Potassium: 4.7 mmol/L (ref 3.5–5.1)
Potassium: 3.9 mmol/L (ref 3.5–5.1)
Potassium: 3.3 mmol/L — ABNORMAL LOW (ref 3.5–5.1)
Potassium: 3.8 mmol/L (ref 3.5–5.1)
Sodium: 140 mmol/L (ref 135–145)
Sodium: 140 mmol/L (ref 135–145)
Sodium: 137 mmol/L (ref 135–145)
Sodium: 139 mmol/L (ref 135–145)
Sodium: 139 mmol/L (ref 135–145)
Sodium: 140 mmol/L (ref 135–145)
TCO2: 27 mmol/L (ref 22–32)
TCO2: 26 mmol/L (ref 22–32)
TCO2: 26 mmol/L (ref 22–32)
TCO2: 26 mmol/L (ref 22–32)
TCO2: 26 mmol/L (ref 22–32)
TCO2: 24 mmol/L (ref 22–32)

## 2021-08-04 LAB — POCT I-STAT 7, (LYTES, BLD GAS, ICA,H+H)
Acid-Base Excess: 1 mmol/L (ref 0.0–2.0)
Acid-Base Excess: 1 mmol/L (ref 0.0–2.0)
Acid-Base Excess: 1 mmol/L (ref 0.0–2.0)
Acid-Base Excess: 1 mmol/L (ref 0.0–2.0)
Acid-base deficit: 1 mmol/L (ref 0.0–2.0)
Acid-base deficit: 11 mmol/L — ABNORMAL HIGH (ref 0.0–2.0)
Acid-base deficit: 1 mmol/L (ref 0.0–2.0)
Bicarbonate: 25.7 mmol/L (ref 20.0–28.0)
Bicarbonate: 25.3 mmol/L (ref 20.0–28.0)
Bicarbonate: 25.4 mmol/L (ref 20.0–28.0)
Bicarbonate: 24 mmol/L (ref 20.0–28.0)
Bicarbonate: 16.1 mmol/L — ABNORMAL LOW (ref 20.0–28.0)
Bicarbonate: 25.5 mmol/L (ref 20.0–28.0)
Bicarbonate: 24.2 mmol/L (ref 20.0–28.0)
Calcium, Ion: 0.94 mmol/L — ABNORMAL LOW (ref 1.15–1.40)
Calcium, Ion: 1 mmol/L — ABNORMAL LOW (ref 1.15–1.40)
Calcium, Ion: 0.96 mmol/L — ABNORMAL LOW (ref 1.15–1.40)
Calcium, Ion: 1 mmol/L — ABNORMAL LOW (ref 1.15–1.40)
Calcium, Ion: 1.08 mmol/L — ABNORMAL LOW (ref 1.15–1.40)
Calcium, Ion: 1.26 mmol/L (ref 1.15–1.40)
Calcium, Ion: 1.14 mmol/L — ABNORMAL LOW (ref 1.15–1.40)
HCT: 27 % — ABNORMAL LOW (ref 36.0–46.0)
HCT: 30 % — ABNORMAL LOW (ref 36.0–46.0)
HCT: 26 % — ABNORMAL LOW (ref 36.0–46.0)
HCT: 27 % — ABNORMAL LOW (ref 36.0–46.0)
HCT: 29 % — ABNORMAL LOW (ref 36.0–46.0)
HCT: 26 % — ABNORMAL LOW (ref 36.0–46.0)
HCT: 25 % — ABNORMAL LOW (ref 36.0–46.0)
Hemoglobin: 9.2 g/dL — ABNORMAL LOW (ref 12.0–15.0)
Hemoglobin: 10.2 g/dL — ABNORMAL LOW (ref 12.0–15.0)
Hemoglobin: 8.8 g/dL — ABNORMAL LOW (ref 12.0–15.0)
Hemoglobin: 9.2 g/dL — ABNORMAL LOW (ref 12.0–15.0)
Hemoglobin: 9.9 g/dL — ABNORMAL LOW (ref 12.0–15.0)
Hemoglobin: 8.8 g/dL — ABNORMAL LOW (ref 12.0–15.0)
Hemoglobin: 8.5 g/dL — ABNORMAL LOW (ref 12.0–15.0)
O2 Saturation: 100 %
O2 Saturation: 100 %
O2 Saturation: 100 %
O2 Saturation: 100 %
O2 Saturation: 99 %
O2 Saturation: 99 %
O2 Saturation: 98 %
Patient temperature: 35.5
Patient temperature: 35.2
Patient temperature: 38.2
Potassium: 3.8 mmol/L (ref 3.5–5.1)
Potassium: 3.5 mmol/L (ref 3.5–5.1)
Potassium: 4 mmol/L (ref 3.5–5.1)
Potassium: 3.8 mmol/L (ref 3.5–5.1)
Potassium: 3.9 mmol/L (ref 3.5–5.1)
Potassium: 4 mmol/L (ref 3.5–5.1)
Potassium: 3.6 mmol/L (ref 3.5–5.1)
Sodium: 139 mmol/L (ref 135–145)
Sodium: 138 mmol/L (ref 135–145)
Sodium: 139 mmol/L (ref 135–145)
Sodium: 140 mmol/L (ref 135–145)
Sodium: 148 mmol/L — ABNORMAL HIGH (ref 135–145)
Sodium: 142 mmol/L (ref 135–145)
Sodium: 141 mmol/L (ref 135–145)
TCO2: 27 mmol/L (ref 22–32)
TCO2: 26 mmol/L (ref 22–32)
TCO2: 27 mmol/L (ref 22–32)
TCO2: 25 mmol/L (ref 22–32)
TCO2: 17 mmol/L — ABNORMAL LOW (ref 22–32)
TCO2: 27 mmol/L (ref 22–32)
TCO2: 25 mmol/L (ref 22–32)
pCO2 arterial: 40.9 mmHg (ref 32.0–48.0)
pCO2 arterial: 37.3 mmHg (ref 32.0–48.0)
pCO2 arterial: 40.6 mmHg (ref 32.0–48.0)
pCO2 arterial: 39.7 mmHg (ref 32.0–48.0)
pCO2 arterial: 36.6 mmHg (ref 32.0–48.0)
pCO2 arterial: 35.6 mmHg (ref 32.0–48.0)
pCO2 arterial: 45.8 mmHg (ref 32.0–48.0)
pH, Arterial: 7.406 (ref 7.350–7.450)
pH, Arterial: 7.439 (ref 7.350–7.450)
pH, Arterial: 7.405 (ref 7.350–7.450)
pH, Arterial: 7.389 (ref 7.350–7.450)
pH, Arterial: 7.243 — ABNORMAL LOW (ref 7.350–7.450)
pH, Arterial: 7.456 — ABNORMAL HIGH (ref 7.350–7.450)
pH, Arterial: 7.336 — ABNORMAL LOW (ref 7.350–7.450)
pO2, Arterial: 317 mmHg — ABNORMAL HIGH (ref 83.0–108.0)
pO2, Arterial: 323 mmHg — ABNORMAL HIGH (ref 83.0–108.0)
pO2, Arterial: 293 mmHg — ABNORMAL HIGH (ref 83.0–108.0)
pO2, Arterial: 200 mmHg — ABNORMAL HIGH (ref 83.0–108.0)
pO2, Arterial: 159 mmHg — ABNORMAL HIGH (ref 83.0–108.0)
pO2, Arterial: 109 mmHg — ABNORMAL HIGH (ref 83.0–108.0)
pO2, Arterial: 123 mmHg — ABNORMAL HIGH (ref 83.0–108.0)

## 2021-08-04 LAB — POCT I-STAT EG7
Acid-base deficit: 1 mmol/L (ref 0.0–2.0)
Bicarbonate: 24.7 mmol/L (ref 20.0–28.0)
Calcium, Ion: 1.03 mmol/L — ABNORMAL LOW (ref 1.15–1.40)
HCT: 27 % — ABNORMAL LOW (ref 36.0–46.0)
Hemoglobin: 9.2 g/dL — ABNORMAL LOW (ref 12.0–15.0)
O2 Saturation: 77 %
Potassium: 3.7 mmol/L (ref 3.5–5.1)
Sodium: 140 mmol/L (ref 135–145)
TCO2: 26 mmol/L (ref 22–32)
pCO2, Ven: 42.6 mmHg — ABNORMAL LOW (ref 44.0–60.0)
pH, Ven: 7.371 (ref 7.250–7.430)
pO2, Ven: 43 mmHg (ref 32.0–45.0)

## 2021-08-04 LAB — HEMOGLOBIN AND HEMATOCRIT, BLOOD
HCT: 27.6 % — ABNORMAL LOW (ref 36.0–46.0)
Hemoglobin: 9.4 g/dL — ABNORMAL LOW (ref 12.0–15.0)

## 2021-08-04 LAB — CBC
HCT: 28.1 % — ABNORMAL LOW (ref 36.0–46.0)
HCT: 26.6 % — ABNORMAL LOW (ref 36.0–46.0)
Hemoglobin: 9.5 g/dL — ABNORMAL LOW (ref 12.0–15.0)
Hemoglobin: 9 g/dL — ABNORMAL LOW (ref 12.0–15.0)
MCH: 29.1 pg (ref 26.0–34.0)
MCH: 28.9 pg (ref 26.0–34.0)
MCHC: 33.8 g/dL (ref 30.0–36.0)
MCHC: 33.8 g/dL (ref 30.0–36.0)
MCV: 85.9 fL (ref 80.0–100.0)
MCV: 85.5 fL (ref 80.0–100.0)
Platelets: 139 10*3/uL — ABNORMAL LOW (ref 150–400)
Platelets: 174 10*3/uL (ref 150–400)
RBC: 3.27 MIL/uL — ABNORMAL LOW (ref 3.87–5.11)
RBC: 3.11 MIL/uL — ABNORMAL LOW (ref 3.87–5.11)
RDW: 12.9 % (ref 11.5–15.5)
RDW: 13.1 % (ref 11.5–15.5)
WBC: 8 10*3/uL (ref 4.0–10.5)
WBC: 10.7 10*3/uL — ABNORMAL HIGH (ref 4.0–10.5)
nRBC: 0 % (ref 0.0–0.2)
nRBC: 0 % (ref 0.0–0.2)

## 2021-08-04 LAB — PLATELET COUNT: Platelets: 183 10*3/uL (ref 150–400)

## 2021-08-04 LAB — GLUCOSE, CAPILLARY
Glucose-Capillary: 193 mg/dL — ABNORMAL HIGH (ref 70–99)
Glucose-Capillary: 170 mg/dL — ABNORMAL HIGH (ref 70–99)
Glucose-Capillary: 124 mg/dL — ABNORMAL HIGH (ref 70–99)
Glucose-Capillary: 134 mg/dL — ABNORMAL HIGH (ref 70–99)
Glucose-Capillary: 125 mg/dL — ABNORMAL HIGH (ref 70–99)
Glucose-Capillary: 147 mg/dL — ABNORMAL HIGH (ref 70–99)
Glucose-Capillary: 134 mg/dL — ABNORMAL HIGH (ref 70–99)
Glucose-Capillary: 140 mg/dL — ABNORMAL HIGH (ref 70–99)
Glucose-Capillary: 145 mg/dL — ABNORMAL HIGH (ref 70–99)
Glucose-Capillary: 159 mg/dL — ABNORMAL HIGH (ref 70–99)

## 2021-08-04 LAB — BASIC METABOLIC PANEL
Anion gap: 8 (ref 5–15)
BUN: <5 mg/dL — ABNORMAL LOW (ref 8–23)
CO2: 24 mmol/L (ref 22–32)
Calcium: 8.1 mg/dL — ABNORMAL LOW (ref 8.9–10.3)
Chloride: 106 mmol/L (ref 98–111)
Creatinine, Ser: 0.8 mg/dL (ref 0.44–1.00)
GFR, Estimated: >60 mL/min (ref >60)
Glucose, Bld: 153 mg/dL — ABNORMAL HIGH (ref 70–99)
Potassium: 3.6 mmol/L (ref 3.5–5.1)
Sodium: 138 mmol/L (ref 135–145)

## 2021-08-04 LAB — ECHO INTRAOPERATIVE TEE
Height: 67 in
Weight: 3088 oz

## 2021-08-04 LAB — PROTIME-INR
INR: 1.5 — ABNORMAL HIGH (ref 0.8–1.2)
Prothrombin Time: 17.7 seconds — ABNORMAL HIGH (ref 11.4–15.2)

## 2021-08-04 LAB — APTT: aPTT: 33 seconds (ref 24–36)

## 2021-08-04 LAB — MAGNESIUM: Magnesium: 2.1 mg/dL (ref 1.7–2.4)

## 2021-08-04 SURGERY — REPLACEMENT, AORTIC VALVE, OPEN
Anesthesia: General | Site: Chest

## 2021-08-04 MED ORDER — ONDANSETRON HCL 4 MG/2ML IJ SOLN
4.0000 mg | Freq: Four times a day (QID) | INTRAMUSCULAR | Status: DC | PRN
Start: 2021-08-04 — End: 2021-08-11
  Administered 2021-08-04 – 2021-08-10 (×2): 4 mg via INTRAVENOUS
  Filled 2021-08-04 (×2): qty 2

## 2021-08-04 MED ORDER — ALPRAZOLAM 0.5 MG PO TABS: 0.5000 mg | ORAL_TABLET | Freq: Two times a day (BID) | ORAL | Status: DC | PRN

## 2021-08-04 MED ORDER — METOPROLOL TARTRATE 25 MG/10 ML ORAL SUSPENSION: 12.5000 mg | Freq: Two times a day (BID) | ORAL | Status: DC

## 2021-08-04 MED ORDER — ALBUMIN HUMAN 5 % IV SOLN: INTRAVENOUS | Status: DC | PRN

## 2021-08-04 MED ORDER — SODIUM CHLORIDE 0.9 % IV SOLN: INTRAVENOUS | Status: DC | PRN

## 2021-08-04 MED ORDER — ROCURONIUM BROMIDE 10 MG/ML (PF) SYRINGE
PREFILLED_SYRINGE | INTRAVENOUS | Status: AC
  Filled 2021-08-04: qty 20

## 2021-08-04 MED ORDER — LACTATED RINGERS IV SOLN: INTRAVENOUS | Status: DC | PRN

## 2021-08-04 MED ORDER — LACTATED RINGERS IV SOLN: INTRAVENOUS | Status: DC

## 2021-08-04 MED ORDER — HEMOSTATIC AGENTS (NO CHARGE) OPTIME
TOPICAL | Status: DC | PRN
  Administered 2021-08-04: 1 via TOPICAL

## 2021-08-04 MED ORDER — PROPOFOL 10 MG/ML IV BOLUS
INTRAVENOUS | Status: DC | PRN
  Administered 2021-08-04 (×2): 50 mg via INTRAVENOUS
  Administered 2021-08-04: 70 mg via INTRAVENOUS

## 2021-08-04 MED ORDER — NITROGLYCERIN IN D5W 200-5 MCG/ML-% IV SOLN
0.0000 ug/min | INTRAVENOUS | Status: DC
  Administered 2021-08-04: 10 ug/min via INTRAVENOUS

## 2021-08-04 MED ORDER — CHLORHEXIDINE GLUCONATE 0.12% ORAL RINSE (MEDLINE KIT)
15.0000 mL | Freq: Two times a day (BID) | OROMUCOSAL | Status: DC
  Administered 2021-08-04 – 2021-08-05 (×2): 15 mL via OROMUCOSAL

## 2021-08-04 MED ORDER — ROCURONIUM BROMIDE 10 MG/ML (PF) SYRINGE
PREFILLED_SYRINGE | INTRAVENOUS | Status: AC
  Filled 2021-08-04: qty 10

## 2021-08-04 MED ORDER — THROMBIN 5000 UNITS EX SOLR
OROMUCOSAL | Status: DC | PRN
  Administered 2021-08-04 (×2): 4 mL via TOPICAL

## 2021-08-04 MED ORDER — PHENYLEPHRINE 40 MCG/ML (10ML) SYRINGE FOR IV PUSH (FOR BLOOD PRESSURE SUPPORT)
PREFILLED_SYRINGE | INTRAVENOUS | Status: DC | PRN
  Administered 2021-08-04: 80 ug via INTRAVENOUS

## 2021-08-04 MED ORDER — HEPARIN SODIUM (PORCINE) 1000 UNIT/ML IJ SOLN
INTRAMUSCULAR | Status: DC | PRN
  Administered 2021-08-04: 26000 [IU] via INTRAVENOUS
  Administered 2021-08-04: 2000 [IU] via INTRAVENOUS

## 2021-08-04 MED ORDER — ORAL CARE MOUTH RINSE
15.0000 mL | OROMUCOSAL | Status: DC
  Administered 2021-08-04: 15 mL via OROMUCOSAL

## 2021-08-04 MED ORDER — AMIODARONE HCL IN DEXTROSE 360-4.14 MG/200ML-% IV SOLN
30.0000 mg/h | INTRAVENOUS | Status: DC
  Filled 2021-08-04: qty 200

## 2021-08-04 MED ORDER — FAMOTIDINE IN NACL 20-0.9 MG/50ML-% IV SOLN
20.0000 mg | Freq: Two times a day (BID) | INTRAVENOUS | Status: AC
  Administered 2021-08-04 – 2021-08-05 (×2): 20 mg via INTRAVENOUS
  Filled 2021-08-04 (×2): qty 50

## 2021-08-04 MED ORDER — ASPIRIN EC 325 MG PO TBEC
325.0000 mg | DELAYED_RELEASE_TABLET | Freq: Every day | ORAL | Status: DC
  Administered 2021-08-05 – 2021-08-10 (×6): 325 mg via ORAL
  Filled 2021-08-04 (×7): qty 1

## 2021-08-04 MED ORDER — ACETAMINOPHEN 160 MG/5ML PO SOLN: 1000.0000 mg | Freq: Four times a day (QID) | ORAL | Status: AC

## 2021-08-04 MED ORDER — SODIUM CHLORIDE 0.9% FLUSH
3.0000 mL | Freq: Two times a day (BID) | INTRAVENOUS | Status: DC
  Administered 2021-08-05 – 2021-08-06 (×3): 3 mL via INTRAVENOUS

## 2021-08-04 MED ORDER — FENTANYL CITRATE (PF) 250 MCG/5ML IJ SOLN
INTRAMUSCULAR | Status: DC | PRN
  Administered 2021-08-04 (×2): 100 ug via INTRAVENOUS
  Administered 2021-08-04 (×2): 50 ug via INTRAVENOUS
  Administered 2021-08-04: 150 ug via INTRAVENOUS
  Administered 2021-08-04 (×2): 250 ug via INTRAVENOUS
  Administered 2021-08-04 (×2): 50 ug via INTRAVENOUS

## 2021-08-04 MED ORDER — CHLORHEXIDINE GLUCONATE CLOTH 2 % EX PADS: 6.0000 | MEDICATED_PAD | Freq: Every day | CUTANEOUS | Status: DC

## 2021-08-04 MED ORDER — METHOCARBAMOL 500 MG PO TABS: 500.0000 mg | ORAL_TABLET | Freq: Two times a day (BID) | ORAL | Status: DC | PRN

## 2021-08-04 MED ORDER — ROPINIROLE HCL 0.5 MG PO TABS
0.5000 mg | ORAL_TABLET | Freq: Every day | ORAL | Status: DC
  Administered 2021-08-04 – 2021-08-10 (×7): 0.5 mg via ORAL
  Filled 2021-08-04 (×9): qty 1

## 2021-08-04 MED ORDER — AMIODARONE HCL IN DEXTROSE 360-4.14 MG/200ML-% IV SOLN
60.0000 mg/h | INTRAVENOUS | Status: AC
  Administered 2021-08-04: 60 mg/h via INTRAVENOUS
  Filled 2021-08-04: qty 200

## 2021-08-04 MED ORDER — NITROGLYCERIN 0.2 MG/ML ON CALL CATH LAB
INTRAVENOUS | Status: DC | PRN
  Administered 2021-08-04 (×2): 20 ug via INTRAVENOUS

## 2021-08-04 MED ORDER — METOPROLOL TARTRATE 5 MG/5ML IV SOLN
2.5000 mg | INTRAVENOUS | Status: DC | PRN
  Administered 2021-08-04 – 2021-08-06 (×2): 5 mg via INTRAVENOUS
  Filled 2021-08-04 (×2): qty 5

## 2021-08-04 MED ORDER — GABAPENTIN 300 MG PO CAPS
300.0000 mg | ORAL_CAPSULE | Freq: Every day | ORAL | Status: DC
  Administered 2021-08-04 – 2021-08-10 (×7): 300 mg via ORAL
  Filled 2021-08-04 (×7): qty 1

## 2021-08-04 MED ORDER — ~~LOC~~ CARDIAC SURGERY, PATIENT & FAMILY EDUCATION
Freq: Once | Status: DC
  Filled 2021-08-04: qty 1

## 2021-08-04 MED ORDER — ESMOLOL HCL 100 MG/10ML IV SOLN
INTRAVENOUS | Status: DC | PRN
  Administered 2021-08-04: 40 mg via INTRAVENOUS
  Administered 2021-08-04 (×2): 20 mg via INTRAVENOUS

## 2021-08-04 MED ORDER — MIDAZOLAM HCL 2 MG/2ML IJ SOLN
2.0000 mg | INTRAMUSCULAR | Status: DC | PRN
Start: 2021-08-04 — End: 2021-08-07

## 2021-08-04 MED ORDER — LACTATED RINGERS IV SOLN: 500.0000 mL | Freq: Once | INTRAVENOUS | Status: DC | PRN

## 2021-08-04 MED ORDER — PHENYLEPHRINE 40 MCG/ML (10ML) SYRINGE FOR IV PUSH (FOR BLOOD PRESSURE SUPPORT)
PREFILLED_SYRINGE | INTRAVENOUS | Status: AC
  Filled 2021-08-04: qty 10

## 2021-08-04 MED ORDER — PROTAMINE SULFATE 10 MG/ML IV SOLN
INTRAVENOUS | Status: DC | PRN
  Administered 2021-08-04: 280 mg via INTRAVENOUS

## 2021-08-04 MED ORDER — METOPROLOL TARTRATE 12.5 MG HALF TABLET
12.5000 mg | ORAL_TABLET | Freq: Once | ORAL | Status: AC
  Administered 2021-08-04: 12.5 mg via ORAL
  Filled 2021-08-04: qty 1

## 2021-08-04 MED ORDER — DEXTROSE 50 % IV SOLN: 0.0000 mL | INTRAVENOUS | Status: DC | PRN

## 2021-08-04 MED ORDER — AMIODARONE LOAD VIA INFUSION
150.0000 mg | INTRAVENOUS | Status: AC
  Administered 2021-08-04: 150 mg via INTRAVENOUS
  Filled 2021-08-04: qty 83.34

## 2021-08-04 MED ORDER — PLASMA-LYTE A IV SOLN
INTRAVENOUS | Status: DC | PRN
  Administered 2021-08-04: 1000 mL

## 2021-08-04 MED ORDER — CHLORHEXIDINE GLUCONATE CLOTH 2 % EX PADS
6.0000 | MEDICATED_PAD | Freq: Every day | CUTANEOUS | Status: DC
  Administered 2021-08-05 – 2021-08-06 (×2): 6 via TOPICAL

## 2021-08-04 MED ORDER — FENTANYL CITRATE (PF) 250 MCG/5ML IJ SOLN
INTRAMUSCULAR | Status: AC
  Filled 2021-08-04: qty 25

## 2021-08-04 MED ORDER — CHLORHEXIDINE GLUCONATE 4 % EX LIQD: 30.0000 mL | CUTANEOUS | Status: DC

## 2021-08-04 MED ORDER — SODIUM CHLORIDE 0.45 % IV SOLN: INTRAVENOUS | Status: DC | PRN

## 2021-08-04 MED ORDER — ACETAMINOPHEN 160 MG/5ML PO SOLN
650.0000 mg | Freq: Once | ORAL | Status: AC
  Administered 2021-08-04: 650 mg
  Filled 2021-08-04: qty 20.3

## 2021-08-04 MED ORDER — BISACODYL 10 MG RE SUPP: 10.0000 mg | Freq: Every day | RECTAL | Status: DC

## 2021-08-04 MED ORDER — PROTAMINE SULFATE 10 MG/ML IV SOLN
INTRAVENOUS | Status: AC
  Filled 2021-08-04: qty 5

## 2021-08-04 MED ORDER — ESMOLOL HCL 100 MG/10ML IV SOLN
INTRAVENOUS | Status: AC
  Filled 2021-08-04: qty 10

## 2021-08-04 MED ORDER — ASPIRIN 81 MG PO CHEW
324.0000 mg | CHEWABLE_TABLET | Freq: Every day | ORAL | Status: DC
  Administered 2021-08-11: 324 mg
  Filled 2021-08-04: qty 4

## 2021-08-04 MED ORDER — 0.9 % SODIUM CHLORIDE (POUR BTL) OPTIME
TOPICAL | Status: DC | PRN
  Administered 2021-08-04: 5000 mL

## 2021-08-04 MED ORDER — ROCURONIUM BROMIDE 10 MG/ML (PF) SYRINGE
PREFILLED_SYRINGE | INTRAVENOUS | Status: DC | PRN
  Administered 2021-08-04: 100 mg via INTRAVENOUS
  Administered 2021-08-04 (×2): 50 mg via INTRAVENOUS

## 2021-08-04 MED ORDER — LABETALOL HCL 5 MG/ML IV SOLN
INTRAVENOUS | Status: AC
  Filled 2021-08-04: qty 4

## 2021-08-04 MED ORDER — HEPARIN SODIUM (PORCINE) 1000 UNIT/ML IJ SOLN
INTRAMUSCULAR | Status: AC
  Filled 2021-08-04: qty 1

## 2021-08-04 MED ORDER — MIDAZOLAM HCL (PF) 10 MG/2ML IJ SOLN
INTRAMUSCULAR | Status: AC
  Filled 2021-08-04: qty 2

## 2021-08-04 MED ORDER — SODIUM CHLORIDE 0.9 % IV SOLN
INTRAVENOUS | Status: DC
  Administered 2021-08-04: 10 mL/h via INTRAVENOUS

## 2021-08-04 MED ORDER — MIDAZOLAM HCL 5 MG/5ML IJ SOLN
INTRAMUSCULAR | Status: DC | PRN
  Administered 2021-08-04 (×2): 1 mg via INTRAVENOUS
  Administered 2021-08-04: 3 mg via INTRAVENOUS
  Administered 2021-08-04 (×2): 1 mg via INTRAVENOUS

## 2021-08-04 MED ORDER — SACCHAROMYCES BOULARDII 250 MG PO CAPS: 250.0000 mg | ORAL_CAPSULE | Freq: Two times a day (BID) | ORAL | Status: DC

## 2021-08-04 MED ORDER — POTASSIUM CHLORIDE 10 MEQ/50ML IV SOLN
10.0000 meq | INTRAVENOUS | Status: AC
Start: 2021-08-04 — End: 2021-08-04
  Administered 2021-08-04 (×3): 10 meq via INTRAVENOUS

## 2021-08-04 MED ORDER — PANTOPRAZOLE SODIUM 40 MG PO TBEC: 40.0000 mg | DELAYED_RELEASE_TABLET | Freq: Every day | ORAL | Status: DC

## 2021-08-04 MED ORDER — CALCIUM CHLORIDE 10 % IV SOLN
1.0000 g | Freq: Once | INTRAVENOUS | Status: AC
  Administered 2021-08-04: 1 g via INTRAVENOUS

## 2021-08-04 MED ORDER — SODIUM BICARBONATE 8.4 % IV SOLN
100.0000 meq | Freq: Once | INTRAVENOUS | Status: AC
  Administered 2021-08-04: 100 meq via INTRAVENOUS

## 2021-08-04 MED ORDER — VANCOMYCIN HCL IN DEXTROSE 1-5 GM/200ML-% IV SOLN
1000.0000 mg | Freq: Once | INTRAVENOUS | Status: AC
  Administered 2021-08-04: 1000 mg via INTRAVENOUS
  Filled 2021-08-04: qty 200

## 2021-08-04 MED ORDER — CHLORHEXIDINE GLUCONATE 0.12 % MT SOLN
15.0000 mL | Freq: Once | OROMUCOSAL | Status: AC
  Administered 2021-08-04: 15 mL via OROMUCOSAL
  Filled 2021-08-04: qty 15

## 2021-08-04 MED ORDER — ORAL CARE MOUTH RINSE: 15.0000 mL | Freq: Once | OROMUCOSAL | Status: AC

## 2021-08-04 MED ORDER — PANTOPRAZOLE SODIUM 40 MG PO TBEC
40.0000 mg | DELAYED_RELEASE_TABLET | Freq: Every day | ORAL | Status: DC
  Administered 2021-08-05 – 2021-08-10 (×6): 40 mg via ORAL
  Filled 2021-08-04 (×6): qty 1

## 2021-08-04 MED ORDER — PHENYLEPHRINE HCL-NACL 20-0.9 MG/250ML-% IV SOLN: 0.0000 ug/min | INTRAVENOUS | Status: DC

## 2021-08-04 MED ORDER — ACETAMINOPHEN 500 MG PO TABS
1000.0000 mg | ORAL_TABLET | Freq: Four times a day (QID) | ORAL | Status: AC
  Administered 2021-08-05 – 2021-08-09 (×18): 1000 mg via ORAL
  Filled 2021-08-04 (×19): qty 2

## 2021-08-04 MED ORDER — METOCLOPRAMIDE HCL 5 MG/ML IJ SOLN
10.0000 mg | Freq: Four times a day (QID) | INTRAMUSCULAR | Status: AC
  Administered 2021-08-04 – 2021-08-05 (×4): 10 mg via INTRAVENOUS
  Filled 2021-08-04 (×3): qty 2

## 2021-08-04 MED ORDER — PROTAMINE SULFATE 10 MG/ML IV SOLN
INTRAVENOUS | Status: AC
  Filled 2021-08-04: qty 25

## 2021-08-04 MED ORDER — ATORVASTATIN CALCIUM 40 MG PO TABS
40.0000 mg | ORAL_TABLET | Freq: Every day | ORAL | Status: DC
  Administered 2021-08-05 – 2021-08-11 (×7): 40 mg via ORAL
  Filled 2021-08-04 (×6): qty 1

## 2021-08-04 MED ORDER — TRAMADOL HCL 50 MG PO TABS: 50.0000 mg | ORAL_TABLET | ORAL | Status: DC | PRN

## 2021-08-04 MED ORDER — OXYCODONE HCL 5 MG PO TABS
5.0000 mg | ORAL_TABLET | ORAL | Status: DC | PRN
  Administered 2021-08-05: 5 mg via ORAL
  Filled 2021-08-04: qty 1

## 2021-08-04 MED ORDER — PROPOFOL 10 MG/ML IV BOLUS
INTRAVENOUS | Status: AC
  Filled 2021-08-04: qty 40

## 2021-08-04 MED ORDER — DEXMEDETOMIDINE HCL IN NACL 400 MCG/100ML IV SOLN
0.0000 ug/kg/h | INTRAVENOUS | Status: DC
  Administered 2021-08-04: 0.4 ug/kg/h via INTRAVENOUS

## 2021-08-04 MED ORDER — SERTRALINE HCL 100 MG PO TABS
100.0000 mg | ORAL_TABLET | Freq: Every day | ORAL | Status: DC
  Administered 2021-08-05 – 2021-08-11 (×7): 100 mg via ORAL
  Filled 2021-08-04 (×7): qty 1

## 2021-08-04 MED ORDER — BISACODYL 5 MG PO TBEC
10.0000 mg | DELAYED_RELEASE_TABLET | Freq: Every day | ORAL | Status: DC
  Administered 2021-08-05 – 2021-08-11 (×7): 10 mg via ORAL
  Filled 2021-08-04 (×7): qty 2

## 2021-08-04 MED ORDER — MORPHINE SULFATE (PF) 2 MG/ML IV SOLN
1.0000 mg | INTRAVENOUS | Status: DC | PRN
  Administered 2021-08-04: 2 mg via INTRAVENOUS
  Filled 2021-08-04: qty 1

## 2021-08-04 MED ORDER — CHLORHEXIDINE GLUCONATE 0.12 % MT SOLN
15.0000 mL | OROMUCOSAL | Status: AC
  Administered 2021-08-04: 15 mL via OROMUCOSAL

## 2021-08-04 MED ORDER — SODIUM CHLORIDE 0.9% FLUSH: 3.0000 mL | INTRAVENOUS | Status: DC | PRN

## 2021-08-04 MED ORDER — ACETAMINOPHEN 650 MG RE SUPP: 650.0000 mg | Freq: Once | RECTAL | Status: AC

## 2021-08-04 MED ORDER — MAGNESIUM SULFATE 4 GM/100ML IV SOLN
4.0000 g | Freq: Once | INTRAVENOUS | Status: AC
  Administered 2021-08-04: 4 g via INTRAVENOUS
  Filled 2021-08-04: qty 100

## 2021-08-04 MED ORDER — SODIUM CHLORIDE (PF) 0.9 % IJ SOLN
INTRAMUSCULAR | Status: AC
  Filled 2021-08-04: qty 10

## 2021-08-04 MED ORDER — SODIUM CHLORIDE 0.9 % IV SOLN: 250.0000 mL | INTRAVENOUS | Status: DC

## 2021-08-04 MED ORDER — METOPROLOL TARTRATE 12.5 MG HALF TABLET
12.5000 mg | ORAL_TABLET | Freq: Two times a day (BID) | ORAL | Status: DC
  Administered 2021-08-05 – 2021-08-11 (×12): 12.5 mg via ORAL
  Filled 2021-08-04 (×13): qty 1

## 2021-08-04 MED ORDER — ALBUMIN HUMAN 5 % IV SOLN
250.0000 mL | INTRAVENOUS | Status: AC | PRN
  Administered 2021-08-04 – 2021-08-05 (×4): 12.5 g via INTRAVENOUS
  Filled 2021-08-04 (×2): qty 250

## 2021-08-04 MED ORDER — CHLORHEXIDINE GLUCONATE 0.12 % MT SOLN: 15.0000 mL | Freq: Once | OROMUCOSAL | Status: DC

## 2021-08-04 MED ORDER — INSULIN REGULAR(HUMAN) IN NACL 100-0.9 UT/100ML-% IV SOLN: INTRAVENOUS | Status: DC

## 2021-08-04 MED ORDER — DOCUSATE SODIUM 100 MG PO CAPS
200.0000 mg | ORAL_CAPSULE | Freq: Every day | ORAL | Status: DC
  Administered 2021-08-05 – 2021-08-10 (×6): 200 mg via ORAL
  Filled 2021-08-04 (×6): qty 2

## 2021-08-04 SURGICAL SUPPLY — 117 items
ADAPTER CARDIO PERF ANTE/RETRO (ADAPTER) ×4 IMPLANT
ADH SKN CLS APL DERMABOND .7 (GAUZE/BANDAGES/DRESSINGS) ×3
ADH SRG 12 PREFL SYR 3 SPRDR (MISCELLANEOUS)
ADPR PRFSN 84XANTGRD RTRGD (ADAPTER) ×3
APL SWBSTK 6 STRL LF DISP (MISCELLANEOUS)
APPLICATOR COTTON TIP 6 STRL (MISCELLANEOUS) IMPLANT
APPLICATOR COTTON TIP 6IN STRL (MISCELLANEOUS)
BAG DECANTER FOR FLEXI CONT (MISCELLANEOUS) ×4 IMPLANT
BLADE CLIPPER SURG (BLADE) ×4 IMPLANT
BLADE STERNUM SYSTEM 6 (BLADE) ×4 IMPLANT
BLADE SURG 11 STRL SS (BLADE) ×1 IMPLANT
BLADE SURG 15 STRL LF DISP TIS (BLADE) ×3 IMPLANT
BLADE SURG 15 STRL SS (BLADE) ×4
BNDG ELASTIC 4X5.8 VLCR STR LF (GAUZE/BANDAGES/DRESSINGS) ×4 IMPLANT
BNDG ELASTIC 6X5.8 VLCR STR LF (GAUZE/BANDAGES/DRESSINGS) ×4 IMPLANT
BNDG GAUZE ELAST 4 BULKY (GAUZE/BANDAGES/DRESSINGS) ×4 IMPLANT
CANISTER SUCT 3000ML PPV (MISCELLANEOUS) ×4 IMPLANT
CANNULA EZ GLIDE AORTIC 21FR (CANNULA) ×4 IMPLANT
CANNULA GUNDRY RCSP 15FR (MISCELLANEOUS) ×4 IMPLANT
CATH CPB KIT HENDRICKSON (MISCELLANEOUS) ×4 IMPLANT
CATH HEART VENT LEFT (CATHETERS) ×3 IMPLANT
CATH ROBINSON RED A/P 18FR (CATHETERS) ×8 IMPLANT
CATH THORACIC 36FR (CATHETERS) ×4 IMPLANT
CATH THORACIC 36FR RT ANG (CATHETERS) ×7 IMPLANT
CLIP FOGARTY SPRING 6M (CLIP) IMPLANT
CLIP TI WIDE RED SMALL 24 (CLIP) ×1 IMPLANT
CLIP VESOCCLUDE MED 24/CT (CLIP) IMPLANT
CLIP VESOCCLUDE SM WIDE 24/CT (CLIP) IMPLANT
CNTNR URN SCR LID CUP LEK RST (MISCELLANEOUS) IMPLANT
CONT SPEC 4OZ STRL OR WHT (MISCELLANEOUS) ×8
CONTAINER PROTECT SURGISLUSH (MISCELLANEOUS) ×5 IMPLANT
COVER BACK TABLE 24X17X13 BIG (DRAPES) ×1 IMPLANT
DEFOGGER ANTIFOG KIT (MISCELLANEOUS) ×1 IMPLANT
DERMABOND ADVANCED (GAUZE/BANDAGES/DRESSINGS) ×1
DERMABOND ADVANCED .7 DNX12 (GAUZE/BANDAGES/DRESSINGS) IMPLANT
DRAPE CARDIOVASCULAR INCISE (DRAPES) ×4
DRAPE SRG 135X102X78XABS (DRAPES) ×3 IMPLANT
DRAPE WARM FLUID 44X44 (DRAPES) ×4 IMPLANT
DRSG COVADERM 4X14 (GAUZE/BANDAGES/DRESSINGS) ×4 IMPLANT
ELECT REM PT RETURN 9FT ADLT (ELECTROSURGICAL) ×8
ELECTRODE REM PT RTRN 9FT ADLT (ELECTROSURGICAL) ×6 IMPLANT
FELT TEFLON 1X6 (MISCELLANEOUS) ×8 IMPLANT
GAUZE SPONGE 4X4 12PLY STRL (GAUZE/BANDAGES/DRESSINGS) ×8 IMPLANT
GAUZE SPONGE 4X4 12PLY STRL LF (GAUZE/BANDAGES/DRESSINGS) ×2 IMPLANT
GLOVE SURG MICRO LTX SZ6 (GLOVE) ×3 IMPLANT
GLOVE SURG SIGNA 7.5 PF LTX (GLOVE) ×12 IMPLANT
GLOVE SURG UNDER POLY LF SZ6 (GLOVE) ×1 IMPLANT
GLOVE SURG UNDER POLY LF SZ6.5 (GLOVE) ×2 IMPLANT
GOWN STRL REUS W/ TWL LRG LVL3 (GOWN DISPOSABLE) ×12 IMPLANT
GOWN STRL REUS W/ TWL XL LVL3 (GOWN DISPOSABLE) ×6 IMPLANT
GOWN STRL REUS W/TWL LRG LVL3 (GOWN DISPOSABLE) ×32
GOWN STRL REUS W/TWL XL LVL3 (GOWN DISPOSABLE) ×8
HEMOSTAT POWDER SURGIFOAM 1G (HEMOSTASIS) ×12 IMPLANT
HEMOSTAT SURGICEL 2X14 (HEMOSTASIS) ×4 IMPLANT
INSERT FOGARTY XLG (MISCELLANEOUS) IMPLANT
IV CATH 16GX1.16 SAFE RETR GRY (IV SOLUTION) ×1 IMPLANT
KIT BASIN OR (CUSTOM PROCEDURE TRAY) ×4 IMPLANT
KIT SUCTION CATH 14FR (SUCTIONS) ×8 IMPLANT
KIT TURNOVER KIT B (KITS) ×4 IMPLANT
KIT VASOVIEW HEMOPRO 2 VH 4000 (KITS) ×4 IMPLANT
LINE VENT (MISCELLANEOUS) ×1 IMPLANT
MARKER GRAFT CORONARY BYPASS (MISCELLANEOUS) ×12 IMPLANT
NS IRRIG 1000ML POUR BTL (IV SOLUTION) ×20 IMPLANT
PACK E OPEN HEART (SUTURE) ×4 IMPLANT
PACK OPEN HEART (CUSTOM PROCEDURE TRAY) ×4 IMPLANT
PAD ARMBOARD 7.5X6 YLW CONV (MISCELLANEOUS) ×8 IMPLANT
PAD ELECT DEFIB RADIOL ZOLL (MISCELLANEOUS) ×4 IMPLANT
PENCIL BUTTON HOLSTER BLD 10FT (ELECTRODE) ×5 IMPLANT
POSITIONER HEAD DONUT 9IN (MISCELLANEOUS) ×4 IMPLANT
PUNCH AORTIC ROTATE 4.0MM (MISCELLANEOUS) ×1 IMPLANT
PUNCH AORTIC ROTATE 4.5MM 8IN (MISCELLANEOUS) IMPLANT
PUNCH AORTIC ROTATE 5MM 8IN (MISCELLANEOUS) IMPLANT
SET MPS 3-ND DEL (MISCELLANEOUS) ×1 IMPLANT
SUPPORT HEART JANKE-BARRON (MISCELLANEOUS) ×4 IMPLANT
SUT BONE WAX W31G (SUTURE) ×4 IMPLANT
SUT EB EXC GRN/WHT 2-0 V-5 (SUTURE) ×8 IMPLANT
SUT ETHIBON EXCEL 2-0 V-5 (SUTURE) IMPLANT
SUT ETHIBOND 2 0 SH (SUTURE) ×4
SUT ETHIBOND 2 0 SH 36X2 (SUTURE) ×3 IMPLANT
SUT ETHIBOND 2 0 V4 (SUTURE) IMPLANT
SUT ETHIBOND 2 0V4 GREEN (SUTURE) IMPLANT
SUT ETHIBOND 4 0 RB 1 (SUTURE) IMPLANT
SUT ETHIBOND V-5 VALVE (SUTURE) IMPLANT
SUT MNCRL AB 4-0 PS2 18 (SUTURE) ×1 IMPLANT
SUT PROLENE 3 0 SH 1 (SUTURE) IMPLANT
SUT PROLENE 3 0 SH DA (SUTURE) ×4 IMPLANT
SUT PROLENE 4 0 RB 1 (SUTURE) ×24
SUT PROLENE 4 0 SH DA (SUTURE) IMPLANT
SUT PROLENE 4-0 RB1 .5 CRCL 36 (SUTURE) ×6 IMPLANT
SUT PROLENE 6 0 C 1 30 (SUTURE) ×8 IMPLANT
SUT PROLENE 7 0 BV1 MDA (SUTURE) ×5 IMPLANT
SUT PROLENE 8 0 BV175 6 (SUTURE) IMPLANT
SUT SILK  1 MH (SUTURE) ×4
SUT SILK 1 MH (SUTURE) ×3 IMPLANT
SUT STEEL 6MS V (SUTURE) ×4 IMPLANT
SUT STEEL STERNAL CCS#1 18IN (SUTURE) ×1 IMPLANT
SUT STEEL SZ 6 DBL 3X14 BALL (SUTURE) ×4 IMPLANT
SUT VIC AB 1 CTX 36 (SUTURE) ×8
SUT VIC AB 1 CTX36XBRD ANBCTR (SUTURE) ×6 IMPLANT
SUT VIC AB 2-0 CT1 27 (SUTURE) ×4
SUT VIC AB 2-0 CT1 TAPERPNT 27 (SUTURE) IMPLANT
SUT VIC AB 2-0 CTX 27 (SUTURE) IMPLANT
SUT VIC AB 3-0 SH 27 (SUTURE)
SUT VIC AB 3-0 SH 27X BRD (SUTURE) IMPLANT
SUT VIC AB 3-0 X1 27 (SUTURE) IMPLANT
SUT VICRYL 4-0 PS2 18IN ABS (SUTURE) IMPLANT
SYR 10ML KIT SKIN ADHESIVE (MISCELLANEOUS) IMPLANT
SYSTEM SAHARA CHEST DRAIN ATS (WOUND CARE) ×4 IMPLANT
TAPE CLOTH SURG 4X10 WHT LF (GAUZE/BANDAGES/DRESSINGS) ×2 IMPLANT
TAPE PAPER 2X10 WHT MICROPORE (GAUZE/BANDAGES/DRESSINGS) ×1 IMPLANT
TOWEL GREEN STERILE (TOWEL DISPOSABLE) ×5 IMPLANT
TOWEL GREEN STERILE FF (TOWEL DISPOSABLE) ×4 IMPLANT
TRAY FOLEY SLVR 16FR TEMP STAT (SET/KITS/TRAYS/PACK) ×4 IMPLANT
TUBING LAP HI FLOW INSUFFLATIO (TUBING) ×4 IMPLANT
UNDERPAD 30X36 HEAVY ABSORB (UNDERPADS AND DIAPERS) ×5 IMPLANT
VENT LEFT HEART 12002 (CATHETERS) ×4
WATER STERILE IRR 1000ML POUR (IV SOLUTION) ×8 IMPLANT

## 2021-08-04 NOTE — Op Note (Signed)
NAME: [name], AGUALLO MEDICAL RECORD NO: [id number] ACCOUNT NO: [id number] DATE OF BIRTH: 25-Oct-1956 FACILITY: [facility] LOCATION: [facility] PHYSICIAN: Revonda Standard. Roxan Hockey, MD  Operative Report   DATE OF PROCEDURE: 08/04/2021  PREOPERATIVE DIAGNOSES:  Fibroelastoma of aortic valve and 3-vessel coronary artery disease.  POSTOPERATIVE DIAGNOSES:  Fibroelastoma of aortic valve and 3-vessel coronary artery disease.  PROCEDURES PERFORMED:   Median sternotomy, extracorporeal circulation,  Resection of fibroelastoma from aortic valve,  Coronary artery bypass grafting x4  Left internal mammary artery to LAD,  Sequential saphenous vein graft to diagonal 1 and obtuse marginal and s Saphenous vein graft to posterior descending Endoscopic vein harvest right leg.  SURGEON:  Modesto Charon, MD  ASSISTANT:  Ellwood Handler, PA.  ANESTHESIA:  General.  FINDINGS:  Transesophageal echocardiography showed mobile tumor arising from right coronary cusp of aortic valve, preserved left ventricular function.   INTRAOPERATIVE FINDINGS: Diagonal small, fair quality target. Remaining targets good quality.  Good quality conduits. Approximately 1 cm tumor arising from a thin stalk from the free edge of the right coronary leaflet, fibrous stalk of tissue on the  underside of the left coronary leaflet with no tumor present.  Post-bypass transesophageal echocardiography showed good function of the aortic valve with no aortic insufficiency and preserved left ventricular function.  CLINICAL NOTE:  [name] is a 65 year old woman who presented this past spring with a stroke.  She was found to have a papillary fibroelastoma on the aortic valve by transesophageal echocardiography. She completed inpatient rehabilitation and then was  sent for consideration for surgical resection.  The patient was advised to undergo surgical resection.  She had a coronary CT, which showed significant stenoses in all the coronaries with  definite impairment to flow in the LAD and diagonal  distribution.  She was advised to undergo coronary bypass grafting at the same setting.  The indications, risks, benefits, and alternatives were discussed in detail with the patient.  She understood and accepted the risks and agreed to proceed.  OPERATIVE NOTE:  [name] was brought to the preoperative holding area on 08/04/2021.  Anesthesia placed a Swan-Ganz catheter and an arterial blood pressure monitoring line.  She was taken to the operating room and anesthetized and intubated.  A Foley  catheter was placed.  Intravenous antibiotics were administered.  Transesophageal echocardiography was performed by Dr. Renold Don of anesthesia.  Please see his separately dictated note for full details.  The fibroelastoma was present and noted to  arise from the free edge of the right coronary cusp.  The chest, abdomen and legs were prepped and draped in the usual sterile fashion.  A timeout was performed.  A median sternotomy was performed and the left internal mammary artery was harvested using standard technique.  There was sternal osteoporosis.  The left mammary artery was a good quality vessel. Simultaneously with the mammary  harvest, an incision was made in the medial aspect of the right leg at the level of the knee.  The greater saphenous vein was harvested from the groin to the mid calf endoscopically. It was relatively small caliber but good quality vein.  After  harvesting the conduits, the remainder of the full heparin dose was given.  The sternal retractor was placed and gently opened over time.  The pericardium was opened.  The ascending aorta was inspected.  There was no evidence of atherosclerotic disease.  The aorta was cannulated via concentric 2-0 Ethibond pledgeted pursestring  sutures.  Adequate anticoagulation was confirmed with ACT measurement  prior to cannulating the aorta.  A dual stage venous cannula was placed via pursestring  suture in the right atrial appendage.  Cardiopulmonary bypass was initiated.  Flows were  maintained per protocol.  The patient was cooled to 32 degrees Celsius.  The coronary arteries were inspected and anastomotic sites were chosen.  The conduits were inspected and prepared.  A left ventricular vent was placed via pursestring suture in the  right superior pulmonary vein.  A retrograde cardioplegia cannula was placed via pursestring suture in the right atrium and directed into the coronary sinus and an antegrade cardioplegia cannula was placed in the ascending aorta.  The aorta was cross clamped.  The left ventricle was emptied via the aortic root vent. Cardiac arrest then was achieved with a combination of cold antegrade and retrograde blood cardioplegia and topical iced saline.  Initial 500 mL of cardioplegia was  administered antegrade.  There was a rapid diastolic arrest.  An additional 500 mL of cardioplegia was administered retrograde and there was subsequent cooling to 10 degrees Celsius.  A reversed saphenous vein graft was placed end-to-side to the posterior descending branch of the right coronary.  This was a 1.5 mm good quality target.  There was significant calcification in the distal right coronary that was easily palpable.  The vein  was anastomosed end-to-side with a running 7-0 Prolene suture.  All anastomoses were probed proximally and distally at their completion to ensure patency.  Cardioplegia was administered down the graft and there was good flow and good hemostasis.  Next, a reversed saphenous vein graft was placed sequentially to the first diagonal branch of the LAD and the large obtuse marginal.  There was a single large lateral OM vessel.  The diagonal was only a fair quality target.  The OM was a good quality  target.  A side-to-side anastomosis was performed to the diagonal and end-to-side to the OM.  Both were done with running 7-0 Prolene sutures.  Cardioplegia was  administered down the graft and there was good flow and good hemostasis.  Additional cardioplegia was administered via the retrograde cannula.  The left internal mammary artery was brought through a window in the pericardium.  The distal end was bevelled.  It was anastomosed end-to-side to the distal LAD.  The LAD was a 1.5  mm good quality target and the mammary was a 1.5 mm good quality conduit.  The anastomosis was performed end-to-side with a running 8-0 Prolene suture.  At the completion of the anastomosis, the bulldog clamp was briefly removed.  There was good  hemostasis.  There was rapid septal rewarming.  The bulldog clamp was replaced and additional cardioplegia was administered via the retrograde cannula.  An aortotomy was performed.  The aortic valve was inspected.  The fibroelastoma was easily noted arising from the free edge of the right coronary leaflet, it was approximately a centimeter in size and papillary in nature.  Care was taken not to disturb  the tumor to prevent any debris from coming off. The stalk of the tumor was gently grasped and was excised from the aortic valve. Inspection of the aortic valve also revealed a fibrous stalk of tissue arising from the undersurface of the left coronary  cusp.  There was no corresponding papillary tumor associated, this area was trimmed off as well.  Inspection of the leaflet showed good coaptation and was felt the valve could be preserved.  The aortotomy was closed in two layers with running 4-0  Prolene  sutures.  The first was a running horizontal mattress suture followed by a running simple suture.  Additional cardioplegia was administered at 20-minute intervals during this portion of the procedure.  The cardioplegia cannula was removed from the ascending aorta.  The proximal vein graft anastomoses were performed with 4.0 mm punch aortotomies with running 6-0 Prolene sutures.  At the completion of the second proximal anastomosis, before tying  the  suture, the patient was placed in Trendelenburg position.  A warm dose of retrograde cardioplegia was administered.  The bulldog clamp then was removed from the left mammary artery.  De-airing was performed and the aortic crossclamp was removed.  The  total crossclamp time was 93 minutes.  The patient required two defibrillation with 10 joules and then was in sinus rhythm. While rewarming was completed,  all proximal and distal anastomoses and the aortotomy were inspected for hemostasis.  Epicardial  pacing wires were placed on the right ventricle and right atrium.  The patient was at this point tachycardic, it was not clear exactly what the rhythm was, although it was regular. When she rewarmed to 37 degrees Celsius, she was weaned from  cardiopulmonary bypass on the first attempt.  She did not require inotropic support.  Total bypass time was 133 minutes.  The initial cardiac index was greater than 2 liters per minute per meter squared.  Transesophageal echocardiography showed the  aortic valve was competent with no insufficiency and good movement of all leaflets.  There was preserved left ventricular function. A test dose of protamine was administered and was well tolerated.  The atrial and aortic cannula were removed.  The  remainder of the protamine was administered without incident.  The chest was irrigated with warm saline.  The pericardium was reapproximated over the aortic root and base of the heart with interrupted 3-0 silk sutures.  It came together easily without  tension.  A dose of esmolol was tried as the patient was tachycardic into the 130s with the rhythm still being regular.  It was felt that this might be atrial flutter, so the patient was given amiodarone. After administration of amiodarone, the patient  converted to sinus rhythm in the 60s and then was atrially paced at 80 beats per minute. Left pleural and mediastinal chest tubes were placed through separate subcostal incisions.   The sternum was closed with a combination of single and double heavy  gauge stainless steel wires.  The pectoralis fascia, subcutaneous tissue and skin were closed in standard fashion.  All sponge, needle and instrument counts were correct at the end of the procedure. The patient was taken from the operating room to the  surgical intensive care unit, intubated and in good condition.   SHW D: 08/04/2021 4:01:34 pm T: 08/04/2021 9:11:00 pm  JOB: [id number]

## 2021-08-04 NOTE — Progress Notes (Signed)
EVENING ROUNDS NOTE :     [address] [place]       [place] [address]             [phone]                 Day of Surgery Procedure(s) (LRB): RESECTION AORTIC VALVE TUMOR (N/A) CORONARY ARTERY BYPASS GRAFTING (CABG) X  4  USING LEFT INTERNAL MAMMARY ARTERY AND RIGHT GREATER SAPHENOUS VEIN ENDOSCOPIC CONDUITS (N/A) TRANSESOPHAGEAL ECHOCARDIOGRAM (TEE) (N/A) APPLICATION OF CELL SAVER   Total Length of Stay:  LOS: 0 days  Events:   Minmal CT output Awake On nitro Wean to ext    BP 121/77   Pulse 80   Temp 99 F (37.2 C)   Resp 12   Ht '5\' 7"'$  (1.702 m)   Wt 87.5 kg   SpO2 100%   BMI 30.23 kg/m   PAP: (19-32)/(11-19) 23/13 CO:  [2.9 L/min-3.9 L/min] 3.9 L/min CI:  [1.5 L/min/m2-1.9 L/min/m2] 1.9 L/min/m2  Vent Mode: SIMV;PRVC FiO2 (%):  [40 %-50 %] 40 % Set Rate:  [4 bmp-12 bmp] 12 bmp Vt Set:  [600 mL] 600 mL PEEP:  [5 cmH20] 5 cmH20 Pressure Support:  [10 cmH20] 10 cmH20   sodium chloride     [START ON 08/05/2021] sodium chloride     sodium chloride 10 mL/hr (08/04/21 1541)   albumin human 12.5 g (08/04/21 1605)   dexmedetomidine (PRECEDEX) IV infusion Stopped (08/04/21 1715)   famotidine (PEPCID) IV 20 mg (08/04/21 1457)   insulin 1.4 Units/hr (08/04/21 1900)   lactated ringers     lactated ringers     lactated ringers 10 mL/hr (08/04/21 1454)   nitroGLYCERIN Stopped (08/04/21 1858)   phenylephrine (NEO-SYNEPHRINE) Adult infusion 0 mcg/min (08/04/21 1443)   vancomycin      I/O last 3 completed shifts: In: 4620.6 [I.V.:2638.4; Blood:450; IV Piggyback:1532.2] Out: [id number] [Urine:3460; Blood:800; Chest Tube:50]   CBC Latest Ref Rng & Units 08/04/2021 08/04/2021 08/04/2021  WBC 4.0 - 10.5 K/uL - - 8.0  Hemoglobin 12.0 - 15.0 g/dL 8.8(L) 9.9(L) 9.5(L)  Hematocrit 36.0 - 46.0 % 26.0(L) 29.0(L) 28.1(L)  Platelets 150 - 400 K/uL - - 139(L)    BMP Latest Ref Rng & Units 08/04/2021 08/04/2021 08/04/2021  Glucose 70 - 99 mg/dL - - 126(H)  BUN 8 - 23  mg/dL - - 4(L)  Creatinine 0.44 - 1.00 mg/dL - - 0.50  BUN/Creat Ratio 12 - 28 - - -  Sodium 135 - 145 mmol/L 142 148(H) 140  Potassium 3.5 - 5.1 mmol/L 4.0 3.9 3.8  Chloride 98 - 111 mmol/L - - 100  CO2 22 - 32 mmol/L - - -  Calcium 8.9 - 10.3 mg/dL - - -    ABG    Component Value Date/Time   PHART 7.456 (H) 08/04/2021 1505   PCO2ART 35.6 08/04/2021 1505   PO2ART 109 (H) 08/04/2021 1505   HCO3 25.5 08/04/2021 1505   TCO2 27 08/04/2021 1505   ACIDBASEDEF 11.0 (H) 08/04/2021 1353   O2SAT 99.0 08/04/2021 1505       Kimberly Bouillon, MD 08/04/2021 7:22 PM

## 2021-08-04 NOTE — Brief Op Note (Signed)
08/04/2021  3:49 PM  PATIENT:  [name]  65 y.o. female  PRE-OPERATIVE DIAGNOSIS:  Aortic Valve Fibroelastoma, CAD  POST-OPERATIVE DIAGNOSIS:  Aortic Valve Fibroelastoma, CAD  PROCEDURE:  Procedure(s):  RESECTION AORTIC VALVE TUMOR   CORONARY ARTERY BYPASS GRAFTING X 4 -LIMA to LAD -SEQ SVG to OM and DIAGONAL -SVG to PDA  ENDOSCOPIC HARVEST GREATER SAPHENOUS VEIN -Right Leg  TRANSESOPHAGEAL ECHOCARDIOGRAM (TEE) (N/A)  APPLICATION OF CELL SAVER  SURGEON:  Surgeon(s) and Role:    * [name], MD - Primary  PHYSICIAN ASSISTANT: Ellwood Handler PA-C  ASSISTANTS: Despina Arias RNFA   ANESTHESIA:   general  EBL:  800 mL   BLOOD ADMINISTERED:   CC CELLSAVER  DRAINS:  Left Pleural Chest Tubes, Mediastinal Chest Drains    LOCAL MEDICATIONS USED:  MARCAINE     SPECIMEN:  Source of Specimen:  Papillary Fibroelastoma  DISPOSITION OF SPECIMEN:  PATHOLOGY  COUNTS:  YES  TOURNIQUET:  * No tourniquets in log *  DICTATION: .Dragon Dictation  PLAN OF CARE: Admit to inpatient   PATIENT DISPOSITION:  ICU - intubated and hemodynamically stable.   Delay start of Pharmacological VTE agent (>24hrs) due to surgical blood loss or risk of bleeding: yes

## 2021-08-04 NOTE — Anesthesia Procedure Notes (Signed)
Central Venous Catheter Insertion Performed by: [name], MD, anesthesiologist Start/End8/22/2022 7:03 AM, 08/04/2021 7:14 AM Patient location: Pre-op. Preanesthetic checklist: patient identified, IV checked, risks and benefits discussed, surgical consent, monitors and equipment checked, pre-op evaluation, timeout performed and anesthesia consent Position: Trendelenburg Lidocaine 1% used for infiltration and patient sedated Hand hygiene performed , maximum sterile barriers used  and Seldinger technique used Catheter size: 8.5 Fr Central line was placed.MAC introducer Procedure performed using ultrasound guided technique. Ultrasound Notes:anatomy identified, needle tip was noted to be adjacent to the nerve/plexus identified, no ultrasound evidence of intravascular and/or intraneural injection and image(s) printed for medical record Attempts: 1 Following insertion, line sutured, dressing applied and Biopatch. Post procedure assessment: blood return through all ports, no air and free fluid flow  Patient tolerated the procedure well with no immediate complications.

## 2021-08-04 NOTE — Interval H&P Note (Signed)
History and Physical Interval Note:  08/04/2021 7:26 AM  [name]  has presented today for surgery, with the diagnosis of Aortic Valve Fibroelastoma, CAD.  The various methods of treatment have been discussed with the patient and family. After consideration of risks, benefits and other options for treatment, the patient has consented to  Procedure(s): RESECTION AORTIC VALVE TUMOR, POSSIBLE AORTIC VALVE REPLACEMENT (N/A) CORONARY ARTERY BYPASS GRAFTING (CABG) (N/A) TRANSESOPHAGEAL ECHOCARDIOGRAM (TEE) (N/A) as a surgical intervention.  The patient's history has been reviewed, patient examined, no change in status, stable for surgery.  I have reviewed the patient's chart and labs.  Questions were answered to the patient's satisfaction.     [name]

## 2021-08-04 NOTE — Progress Notes (Signed)
RT note- patient has been re assessed for weaning, remains very sleepy. Continue to monitor.

## 2021-08-04 NOTE — Anesthesia Procedure Notes (Signed)
Central Venous Catheter Insertion Performed by: [name], MD, anesthesiologist Start/End8/22/2022 7:12 AM, 08/04/2021 7:14 AM Patient location: Pre-op. Preanesthetic checklist: patient identified, IV checked, risks and benefits discussed, surgical consent, monitors and equipment checked, pre-op evaluation, timeout performed and anesthesia consent Position: Trendelenburg Hand hygiene performed  and maximum sterile barriers used  Total catheter length 10. PA cath was placed.Swan type:thermodilution PA Cath depth:45 Procedure performed without using ultrasound guided technique. Attempts: 1 Patient tolerated the procedure well with no immediate complications.

## 2021-08-04 NOTE — Progress Notes (Signed)
  NIF X3 best effort -27 VC pre extubation 961m best effort

## 2021-08-04 NOTE — Anesthesia Procedure Notes (Signed)
Procedure Name: Intubation Date/Time: 08/04/2021 8:09 AM Performed by: Reece Agar, CRNA Pre-anesthesia Checklist: Patient identified, Emergency Drugs available, Suction available and Patient being monitored Patient Re-evaluated:Patient Re-evaluated prior to induction Oxygen Delivery Method: Circle system utilized Preoxygenation: Pre-oxygenation with 100% oxygen Induction Type: IV induction Ventilation: Mask ventilation without difficulty and Oral airway inserted - appropriate to patient size Laryngoscope Size: Mac and 4 Grade View: Grade I Tube type: Oral Tube size: 8.0 mm Number of attempts: 1 Airway Equipment and Method: Stylet and Oral airway Placement Confirmation: ETT inserted through vocal cords under direct vision, positive ETCO2 and breath sounds checked- equal and bilateral Secured at: 23 cm Tube secured with: Tape Dental Injury: Teeth and Oropharynx as per pre-operative assessment  Comments: Intubated by Everlene Other SRNA

## 2021-08-04 NOTE — Hospital Course (Addendum)
History of Present Illness:  [name] is sent for consultation regarding a papillary fibroelastoma of the aortic valve and coronary artery disease.  [name] is a 65 year old woman with a medical history of coronary artery disease, fibroblastoma of the aortic valve, embolic right thalamic infarction, hypertension, hyperlipidemia, type 2 diabetes, anxiety, arthritis, reflux and depression.  She was in her usual state of health until May when she developed acute onset of dizziness, left-sided weakness, left-sided numbness, and slurred speech.  She was taken to the emergency room at [facility] as a code stroke.  Her speech improved rapidly.  Initial CT was negative.  She was hypertensive.  Neurology was consulted and she was treated with tPA.  An MRI showed a small right thalamic infarct and occlusion of the segmental branch of the right posterior cerebral artery.  She was transferred to [facility].  She underwent retrieval of the embolus by IR.  She had a TEE which showed normal left ventricular function with a large mobile mass on the aortic valve consistent with a papillary fibroblastoma.  There was no aortic stenosis or insufficiency.   She then went through inpatient rehab.  She currently is staying with her daughter.  She still have some numbness in her left arm greater than leg and also some weakness in her left hand.  Speech is normal and she is able to walk with a cane or walker.  She denied having any chest pain, pressure, tightness, or shortness of breath.  She was evaluated by Dr. Roxan Hockey who felt patient would benefit from coronary artery bypass grafting procedure, resection of papillary fibroelastoma, and possible Aortic Valve Replacement.  The risks and benefits of the procedure were explained to the patient and she was agreeable to proceed.  After her consultation she contracted COVID resulting in her surgery being rescheduled.  She recovered from this without difficulty.  Hospital  Course:  [name] presented to [facility] on 08/04/2021.  She was taken to the operating room and underwent CABG x 4 utilizing LIMA to LAD, SVG to PDA, and Sequential SVG to OM and Diagonal.  She also underwent resection of an aortic valve mass, most likely to be a papillary fibroelastoma.  Finally she underwent endoscopic harvest of greater saphenous vein from her right leg.  She tolerated the procedure without difficulty and was taken to the SICU in stable condition.  She was extubated the evening of surgery.  During her stay in the SICU her post operative rhythm was NSR with 1st degree AV block.  She underwent debridement of chest tubes, and arterial lines.  She required IV Amiodarone in the operating room due to Atrial Flutter.  This was converted to an oral regimen.  She was started on diuretics for volume overloaded state.  The patient remained somnolent which is her baseline.  PT evaluation was requested for possible CIR placement.  Due to her recent stroke PT/OT evaluations were performed and recommended CIR placement.  She developed hypoglycemia and her insulin was discontinued.  She remains in NSR and was felt stable for transfer to the progressive care unit on 08/07/2021.  The patient continued to maintain NSR.  Her pacing wires were removed without difficulty.  The patient remained hypertensive and her home Cozaar was restarted and titrated as needed.  She continued to participate with PT/OT and CIR placement has been authorized by her insurance.  She remains clinically stable.  Her surgical incisions are healing without evidence of infection.  She does have  some ecchymosis along her RLE.  As of 08/29, she is on Amlodipine 10 mg daily, Hydralazine 10 mg at hs, Losartan 100 mg daily, and Lopressor 12.5 mg bid with good BP control. She is still above pre op weight so she will be continued on daily Lasix and potassium supplement for the next 5 days. Chest tube sutures will be removed the day of  discharge to CIR. She is felt medically stable for discharge to CIR today.

## 2021-08-04 NOTE — Progress Notes (Signed)
RT note-Continues to have a low VE, placed back to full rate 12.

## 2021-08-04 NOTE — Anesthesia Postprocedure Evaluation (Signed)
Anesthesia Post Note  Patient: [name]  Procedure(s) Performed: RESECTION AORTIC VALVE TUMOR (Chest) CORONARY ARTERY BYPASS GRAFTING (CABG) X  4  USING LEFT INTERNAL MAMMARY ARTERY AND RIGHT GREATER SAPHENOUS VEIN ENDOSCOPIC CONDUITS (Chest) TRANSESOPHAGEAL ECHOCARDIOGRAM (TEE) APPLICATION OF CELL SAVER     Patient location during evaluation: ICU Anesthesia Type: General Level of consciousness: sedated and patient remains intubated per anesthesia plan Pain management: pain level controlled Vital Signs Assessment: post-procedure vital signs reviewed and stable Respiratory status: patient remains intubated per anesthesia plan Cardiovascular status: stable Postop Assessment: no apparent nausea or vomiting Anesthetic complications: no   No notable events documented.  Last Vitals:  Vitals:   08/04/21 1355 08/04/21 1510  BP: 114/84   Pulse: 80 80  Resp: 12 12  Temp: (!) 35.4 C (!) 35.3 C  SpO2: 100% 100%    Last Pain:  Vitals:   08/04/21 1400  TempSrc: Core  PainSc:                  [name]

## 2021-08-04 NOTE — Anesthesia Procedure Notes (Signed)
Arterial Line Insertion Start/End8/22/2022 6:55 AM, 08/04/2021 7:02 AM Performed by: [name], MD, Reece Agar, CRNA, CRNA  Patient location: Pre-op. Preanesthetic checklist: patient identified, IV checked, site marked, risks and benefits discussed, surgical consent, monitors and equipment checked, pre-op evaluation, timeout performed and anesthesia consent Lidocaine 1% used for infiltration Right, radial was placed Catheter size: 20 G Hand hygiene performed  and maximum sterile barriers used   Attempts: 1 Procedure performed without using ultrasound guided technique. Following insertion, dressing applied and Biopatch. Post procedure assessment: normal and unchanged  Patient tolerated the procedure well with no immediate complications. Additional procedure comments: Inserted by Everlene Other SRNA.

## 2021-08-04 NOTE — Transfer of Care (Signed)
Immediate Anesthesia Transfer of Care Note  Patient: [name]  Procedure(s) Performed: RESECTION AORTIC VALVE TUMOR (Chest) CORONARY ARTERY BYPASS GRAFTING (CABG) X  4  USING LEFT INTERNAL MAMMARY ARTERY AND RIGHT GREATER SAPHENOUS VEIN ENDOSCOPIC CONDUITS (Chest) TRANSESOPHAGEAL ECHOCARDIOGRAM (TEE) APPLICATION OF CELL SAVER  Patient Location: ICU  Anesthesia Type:General  Level of Consciousness: Patient remains intubated per anesthesia plan  Airway & Oxygen Therapy: Patient remains intubated per anesthesia plan and Patient placed on Ventilator (see vital sign flow sheet for setting)  Post-op Assessment: Report given to RN and Post -op Vital signs reviewed and stable  Post vital signs: Reviewed and stable  Last Vitals:  Vitals Value Taken Time  BP 114/84 08/04/21 1352  Temp 35.5 C 08/04/21 1353  Pulse 80 08/04/21 1353  Resp 12 08/04/21 1353  SpO2 100 % 08/04/21 1353  Vitals shown include unvalidated device data.  Last Pain:  Vitals:   08/04/21 0603  TempSrc:   PainSc: 0-No pain         Complications: No notable events documented.

## 2021-08-04 NOTE — Procedures (Signed)
Extubation Procedure Note  Patient Details:   Name: [name] DOB: January 05, 1956 MRN: [id number]   Airway Documentation:    Vent end date: (not recorded) Vent end time: (not recorded)   Evaluation  O2 sats: stable throughout Complications: No apparent complications Patient did tolerate procedure well. Bilateral Breath Sounds: Clear, Diminished   Yes  Iris Tatsch Arrie Senate 08/04/2021, 10:02 PM

## 2021-08-04 NOTE — Progress Notes (Signed)
  Echocardiogram Echocardiogram Transesophageal has been performed.  Fidel Levy 08/04/2021, 2:53 PM

## 2021-08-05 ENCOUNTER — Encounter (HOSPITAL_COMMUNITY): Payer: Self-pay | Admitting: Thoracic Surgery (Cardiothoracic Vascular Surgery)

## 2021-08-05 ENCOUNTER — Inpatient Hospital Stay (HOSPITAL_COMMUNITY): Payer: 59

## 2021-08-05 ENCOUNTER — Other Ambulatory Visit: Payer: Self-pay

## 2021-08-05 ENCOUNTER — Encounter: Payer: 59 | Admitting: Physical Medicine and Rehabilitation

## 2021-08-05 LAB — GLUCOSE, CAPILLARY
Glucose-Capillary: 110 mg/dL — ABNORMAL HIGH (ref 70–99)
Glucose-Capillary: 116 mg/dL — ABNORMAL HIGH (ref 70–99)
Glucose-Capillary: 118 mg/dL — ABNORMAL HIGH (ref 70–99)
Glucose-Capillary: 132 mg/dL — ABNORMAL HIGH (ref 70–99)
Glucose-Capillary: 135 mg/dL — ABNORMAL HIGH (ref 70–99)
Glucose-Capillary: 135 mg/dL — ABNORMAL HIGH (ref 70–99)
Glucose-Capillary: 135 mg/dL — ABNORMAL HIGH (ref 70–99)
Glucose-Capillary: 135 mg/dL — ABNORMAL HIGH (ref 70–99)
Glucose-Capillary: 140 mg/dL — ABNORMAL HIGH (ref 70–99)
Glucose-Capillary: 144 mg/dL — ABNORMAL HIGH (ref 70–99)
Glucose-Capillary: 149 mg/dL — ABNORMAL HIGH (ref 70–99)
Glucose-Capillary: 149 mg/dL — ABNORMAL HIGH (ref 70–99)
Glucose-Capillary: 169 mg/dL — ABNORMAL HIGH (ref 70–99)
Glucose-Capillary: 173 mg/dL — ABNORMAL HIGH (ref 70–99)

## 2021-08-05 LAB — POCT I-STAT 7, (LYTES, BLD GAS, ICA,H+H)
Acid-Base Excess: 0 mmol/L (ref 0.0–2.0)
Bicarbonate: 26.2 mmol/L (ref 20.0–28.0)
Calcium, Ion: 1.14 mmol/L — ABNORMAL LOW (ref 1.15–1.40)
HCT: 25 % — ABNORMAL LOW (ref 36.0–46.0)
Hemoglobin: 8.5 g/dL — ABNORMAL LOW (ref 12.0–15.0)
O2 Saturation: 98 %
Patient temperature: 37.6
Potassium: 4.1 mmol/L (ref 3.5–5.1)
Sodium: 139 mmol/L (ref 135–145)
TCO2: 28 mmol/L (ref 22–32)
pCO2 arterial: 48.3 mmHg — ABNORMAL HIGH (ref 32.0–48.0)
pH, Arterial: 7.346 — ABNORMAL LOW (ref 7.350–7.450)
pO2, Arterial: 112 mmHg — ABNORMAL HIGH (ref 83.0–108.0)

## 2021-08-05 LAB — BASIC METABOLIC PANEL
Anion gap: 7 (ref 5–15)
Anion gap: 8 (ref 5–15)
BUN: 5 mg/dL — ABNORMAL LOW (ref 8–23)
BUN: 7 mg/dL — ABNORMAL LOW (ref 8–23)
CO2: 24 mmol/L (ref 22–32)
CO2: 25 mmol/L (ref 22–32)
Calcium: 7.7 mg/dL — ABNORMAL LOW (ref 8.9–10.3)
Calcium: 8.3 mg/dL — ABNORMAL LOW (ref 8.9–10.3)
Chloride: 106 mmol/L (ref 98–111)
Chloride: 104 mmol/L (ref 98–111)
Creatinine, Ser: 0.54 mg/dL (ref 0.44–1.00)
Creatinine, Ser: 0.93 mg/dL (ref 0.44–1.00)
GFR, Estimated: >60 mL/min (ref >60)
GFR, Estimated: >60 mL/min (ref >60)
Glucose, Bld: 142 mg/dL — ABNORMAL HIGH (ref 70–99)
Glucose, Bld: 189 mg/dL — ABNORMAL HIGH (ref 70–99)
Potassium: 4.2 mmol/L (ref 3.5–5.1)
Potassium: 4.4 mmol/L (ref 3.5–5.1)
Sodium: 137 mmol/L (ref 135–145)
Sodium: 137 mmol/L (ref 135–145)

## 2021-08-05 LAB — CBC
HCT: 23.3 % — ABNORMAL LOW (ref 36.0–46.0)
HCT: 25.9 % — ABNORMAL LOW (ref 36.0–46.0)
Hemoglobin: 7.8 g/dL — ABNORMAL LOW (ref 12.0–15.0)
Hemoglobin: 8.4 g/dL — ABNORMAL LOW (ref 12.0–15.0)
MCH: 29 pg (ref 26.0–34.0)
MCH: 28.9 pg (ref 26.0–34.0)
MCHC: 33.5 g/dL (ref 30.0–36.0)
MCHC: 32.4 g/dL (ref 30.0–36.0)
MCV: 86.6 fL (ref 80.0–100.0)
MCV: 89 fL (ref 80.0–100.0)
Platelets: 144 10*3/uL — ABNORMAL LOW (ref 150–400)
Platelets: 156 10*3/uL (ref 150–400)
RBC: 2.69 MIL/uL — ABNORMAL LOW (ref 3.87–5.11)
RBC: 2.91 MIL/uL — ABNORMAL LOW (ref 3.87–5.11)
RDW: 13.3 % (ref 11.5–15.5)
RDW: 13.4 % (ref 11.5–15.5)
WBC: 9.8 10*3/uL (ref 4.0–10.5)
WBC: 11.2 10*3/uL — ABNORMAL HIGH (ref 4.0–10.5)
nRBC: 0 % (ref 0.0–0.2)
nRBC: 0 % (ref 0.0–0.2)

## 2021-08-05 LAB — MAGNESIUM
Magnesium: 1.9 mg/dL (ref 1.7–2.4)
Magnesium: 1.8 mg/dL (ref 1.7–2.4)

## 2021-08-05 LAB — SURGICAL PATHOLOGY

## 2021-08-05 MED ORDER — POTASSIUM CHLORIDE 10 MEQ/50ML IV SOLN
10.0000 meq | INTRAVENOUS | Status: AC
  Administered 2021-08-05 (×3): 10 meq via INTRAVENOUS
  Filled 2021-08-05 (×3): qty 50

## 2021-08-05 MED ORDER — INSULIN DETEMIR 100 UNIT/ML ~~LOC~~ SOLN
20.0000 [IU] | Freq: Two times a day (BID) | SUBCUTANEOUS | Status: DC
  Administered 2021-08-05 (×2): 20 [IU] via SUBCUTANEOUS
  Filled 2021-08-05 (×4): qty 0.2

## 2021-08-05 MED ORDER — INSULIN ASPART 100 UNIT/ML IJ SOLN
0.0000 [IU] | INTRAMUSCULAR | Status: DC
  Administered 2021-08-05: 4 [IU] via SUBCUTANEOUS
  Administered 2021-08-05: 2 [IU] via SUBCUTANEOUS
  Administered 2021-08-05: 4 [IU] via SUBCUTANEOUS

## 2021-08-05 MED ORDER — HYDRALAZINE HCL 10 MG PO TABS
10.0000 mg | ORAL_TABLET | Freq: Every day | ORAL | Status: DC
  Administered 2021-08-07 – 2021-08-10 (×4): 10 mg via ORAL
  Filled 2021-08-05 (×5): qty 1

## 2021-08-05 MED ORDER — FUROSEMIDE 10 MG/ML IJ SOLN
40.0000 mg | Freq: Once | INTRAMUSCULAR | Status: AC
  Administered 2021-08-05: 40 mg via INTRAVENOUS
  Filled 2021-08-05: qty 4

## 2021-08-05 MED ORDER — AMLODIPINE BESYLATE 10 MG PO TABS
10.0000 mg | ORAL_TABLET | Freq: Every day | ORAL | Status: DC
  Administered 2021-08-07 – 2021-08-10 (×4): 10 mg via ORAL
  Filled 2021-08-05 (×5): qty 1

## 2021-08-05 MED ORDER — ENOXAPARIN SODIUM 40 MG/0.4ML IJ SOSY
40.0000 mg | PREFILLED_SYRINGE | Freq: Every day | INTRAMUSCULAR | Status: DC
  Administered 2021-08-05 – 2021-08-10 (×6): 40 mg via SUBCUTANEOUS
  Filled 2021-08-05 (×6): qty 0.4

## 2021-08-05 MED FILL — Sodium Bicarbonate IV Soln 8.4%: INTRAVENOUS | Qty: 50 | Status: AC

## 2021-08-05 MED FILL — Heparin Sodium (Porcine) Inj 1000 Unit/ML: INTRAMUSCULAR | Qty: 20 | Status: AC

## 2021-08-05 MED FILL — Mannitol IV Soln 20%: INTRAVENOUS | Qty: 500 | Status: AC

## 2021-08-05 MED FILL — Heparin Sodium (Porcine) Inj 1000 Unit/ML: INTRAMUSCULAR | Qty: 30 | Status: AC

## 2021-08-05 MED FILL — Sodium Chloride IV Soln 0.9%: INTRAVENOUS | Qty: 2000 | Status: AC

## 2021-08-05 MED FILL — Magnesium Sulfate Inj 50%: INTRAMUSCULAR | Qty: 10 | Status: AC

## 2021-08-05 MED FILL — Potassium Chloride Inj 2 mEq/ML: INTRAVENOUS | Qty: 40 | Status: AC

## 2021-08-05 MED FILL — Lidocaine HCl Local Preservative Free (PF) Inj 2%: INTRAMUSCULAR | Qty: 10 | Status: AC

## 2021-08-05 MED FILL — Electrolyte-R (PH 7.4) Solution: INTRAVENOUS | Qty: 5000 | Status: AC

## 2021-08-05 NOTE — Progress Notes (Signed)
      [address]       [place] [address]             [phone]      POD # 1  BP (!) 89/49   Pulse (!) 59   Temp 97.6 F (36.4 C)   Resp 13   Ht '5\' 7"'$  (1.702 m)   Wt 97.1 kg   SpO2 98%   BMI 33.53 kg/m  2L [place] 98 % sat  K= 4.4, creatinine 0.93 Hct 26   Intake/Output Summary (Last 24 hours) at 08/05/2021 1847 Last data filed at 08/05/2021 1700 Gross per 24 hour  Intake 1763.48 ml  Output 2070 ml  Net -306.52 ml   CBG mildly elevated- on levemir + SSI   No complaints Worked with PT earlier today  Keyaria Lawson C. Roxan Hockey, MD [facility] [phone]

## 2021-08-05 NOTE — Progress Notes (Signed)
Inpatient Rehab Admissions Coordinator:   Per therapy recommendations  patient was screened for CIR candidacy by Clemens Catholic, MS, CCC-SLP. At this time, Pt. Appears to demonstrate medical necessity, functional decline, and ability to tolerate intensity of CIR. Pt. is a potential candidate for CIR. I will request  order for rehab consult per protocol for full assessment. Please contact me any with questions.Clemens Catholic, [place], [place] Admissions Coordinator  [phone] ([place]) [phone] (office)

## 2021-08-05 NOTE — Evaluation (Addendum)
Physical Therapy Evaluation Patient Details Name: [name] MRN: [id number] DOB: 1956-06-29 Today's Date: 08/05/2021   History of Present Illness  65 y.o. female admitted 8/22 for CABG x4 and aortic valve tumor resection. 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  Clinical Impression  Pt limited by fatigue during session. VSS throughout (see below). Pt ambulated in room with min A and RW. Pt educated on adherence to precautions and provided precaution sheet. Pt's deficits include decreased balance, mobility, activity tolerance, and strength. Pt would benefit from continued PT to improve listed deficits and function.Recommend CIR upon d/c. Pt agreeable to d/c recommendations.   Pre-vitals - BP 117/63(80), HR 63, spO2 91% on 2L Post-vitals - BP 120/70(85), spO2 95% on 2L  Follow Up Recommendations CIR    Equipment Recommendations  None recommended by PT (pt has equipment at home)    Recommendations for Other Services       Precautions / Restrictions Precautions Precautions: Fall;Sternal Precaution Booklet Issued: Yes (comment) Precaution Comments: Pt educated on adherence to precautions and provided handout Restrictions Weight Bearing Restrictions: No      Mobility  Bed Mobility               General bed mobility comments: In chair upon entry    Transfers Overall transfer level: Needs assistance Equipment used: Rolling walker (2 wheeled) Transfers: Sit to/from Stand Sit to Stand: +2 physical assistance;Min assist         General transfer comment: Min A +2 to initially power up into standing. After initiating sit to stand, pt quickly rose to standing due to pain.  Ambulation/Gait Ambulation/Gait assistance: Min assist Gait Distance (Feet): 10 Feet Assistive device: Rolling walker (2 wheeled) Gait Pattern/deviations: Step-to pattern Gait  velocity: decreased Gait velocity interpretation: <1.31 ft/sec, indicative of household ambulator General Gait Details: Pt ambulated 10 ft min A+2 for maintainence of RW (keeping it close to pt) and management of lines. Pt demonstrated L foot drag. Verbal cues provided to maintain hips in RW.  Stairs            Wheelchair Mobility    Modified Rankin (Stroke Patients Only)       Balance Overall balance assessment: Needs assistance     Sitting balance - Comments: Unable to assess as pt in recliner upon entry   Standing balance support: Bilateral upper extremity supported;During functional activity Standing balance-Leahy Scale: Poor Standing balance comment: Requires BUE support on RW                             Pertinent Vitals/Pain Pain Assessment: Faces Faces Pain Scale: Hurts little more Pain Location: chest Pain Descriptors / Indicators: Discomfort Pain Intervention(s): Monitored during session    Home Living Family/patient expects to be discharged to:: Private residence Living Arrangements: Children Available Help at Discharge: Family;Available 24 hours/day Type of Home: House Home Access: Stairs to enter   [employer] of Steps: 3 Home Layout: One level Home Equipment: Walker - 2 wheels;Cane - single point;Shower seat;Bedside commode;Wheelchair - manual      Prior Function Level of Independence: Independent with assistive device(s)               Hand Dominance        Extremity/Trunk Assessment   Upper Extremity Assessment Upper Extremity Assessment: Defer to OT evaluation    Lower Extremity  Assessment Lower Extremity Assessment: Generalized weakness    Cervical / Trunk Assessment Cervical / Trunk Assessment: Normal  Communication   Communication: No difficulties  Cognition Arousal/Alertness: Awake/alert Behavior During Therapy: WFL for tasks assessed/performed Overall Cognitive Status: Within Functional Limits for  tasks assessed                                 General Comments: pt followed commands accurately      General Comments General comments (skin integrity, edema, etc.): VSS on 2L    Exercises General Exercises - Lower Extremity Ankle Circles/Pumps: AROM;Both;15 reps;Seated Long Arc Quad: AROM;Both;20 reps;Seated Hip Flexion/Marching: AROM;Both;15 reps;Seated   Assessment/Plan    PT Assessment Patient needs continued PT services  PT Problem List Decreased strength;Decreased mobility;Decreased balance;Decreased activity tolerance       PT Treatment Interventions Gait training;Stair training;Functional mobility training;Therapeutic activities;Therapeutic exercise;Balance training    PT Goals (Current goals can be found in the Care Plan section)       Frequency Min 3X/week   Barriers to discharge        Co-evaluation               AM-PAC PT "6 Clicks" Mobility  Outcome Measure Help needed turning from your back to your side while in a flat bed without using bedrails?: A Little Help needed moving from lying on your back to sitting on the side of a flat bed without using bedrails?: A Little Help needed moving to and from a bed to a chair (including a wheelchair)?: Total Help needed standing up from a chair using your arms (e.g., wheelchair or bedside chair)?: Total Help needed to walk in hospital room?: Total Help needed climbing 3-5 steps with a railing? : Total 6 Click Score: 10    End of Session Equipment Utilized During Treatment: Gait belt;Oxygen Activity Tolerance: Patient limited by fatigue Patient left: in chair;with call bell/phone within reach;with chair alarm set Nurse Communication: Mobility status PT Visit Diagnosis: Unsteadiness on feet (R26.81);Other abnormalities of gait and mobility (R26.89);Muscle weakness (generalized) (M62.81)    Time: 1300-1330 PT Time Calculation (min) (ACUTE ONLY): 30 min   Charges:   PT Evaluation $PT Eval  Moderate Complexity: 1 Mod PT Treatments $Therapeutic Activity: 8-22 mins        Louie Casa, SPT Acute Rehab: [phone] [id number]     Domingo Dimes 08/05/2021, 1:54 PM

## 2021-08-05 NOTE — Patient Outreach (Signed)
[facility]) Care Management  08/05/2021  DERICKA WORTHAM 01-Sep-1956 [id number]   First telephone outreach attempt to obtain mRS. Answered but is in hospital. Will call back later.  Philmore Pali [facility] Management Assistant [phone]

## 2021-08-05 NOTE — Progress Notes (Signed)
1 Day Post-Op Procedure(s) (LRB): RESECTION AORTIC VALVE TUMOR (N/A) CORONARY ARTERY BYPASS GRAFTING (CABG) X  4  USING LEFT INTERNAL MAMMARY ARTERY AND RIGHT GREATER SAPHENOUS VEIN ENDOSCOPIC CONDUITS (N/A) TRANSESOPHAGEAL ECHOCARDIOGRAM (TEE) (N/A) APPLICATION OF CELL SAVER Subjective: "Tired", denies pain  Objective: Vital signs in last 24 hours: Temp:  [95.2 F (35.1 C)-100.8 F (38.2 C)] 98.2 F (36.8 C) (08/23 0700) Pulse Rate:  [65-89] 82 (08/23 0700) Cardiac Rhythm: Atrial paced (08/23 0400) Resp:  [11-30] 13 (08/23 0700) BP: (94-140)/(56-89) 99/63 (08/23 0700) SpO2:  [97 %-100 %] 100 % (08/23 0700) Arterial Line BP: (103-184)/(37-79) 137/45 (08/23 0700) FiO2 (%):  [40 %-50 %] 40 % (08/22 2120) Weight:  [97.1 kg] 97.1 kg (08/23 0500)  Hemodynamic parameters for last 24 hours: PAP: (19-41)/(11-28) 25/14 CVP:  [7 mmHg-21 mmHg] 8 mmHg CO:  [2.9 L/min-6.3 L/min] 3.6 L/min CI:  [1.5 L/min/m2-3.1 L/min/m2] 1.8 L/min/m2  Intake/Output from previous day: 08/22 0701 - 08/23 0700 In: 5613.4 [I.V.:2748.3; Blood:450; NG/GT:30; IV Piggyback:2385] Out: [id number]; Emesis/NG output:100; Blood:800; Chest Tube:340] Intake/Output this shift: No intake/output data recorded.  General appearance: cooperative and no distress Neurologic: moving all 4 ext with good strength Heart: regular rate and rhythm Lungs: diminished breath sounds bibasilar Abdomen: normal findings: soft, non-tender  Lab Results: Recent Labs    08/04/21 2132 08/04/21 2140 08/05/21 0105 08/05/21 0542  WBC 10.7*  --   --  9.8  HGB 9.0*   < > 8.5* 7.8*  HCT 26.6*   < > 25.0* 23.3*  PLT 174  --   --  144*   < > = values in this interval not displayed.   BMET:  Recent Labs    08/04/21 2132 08/04/21 2140 08/05/21 0105 08/05/21 0542  NA 138   < > 139 137  K 3.6   < > 4.1 4.2  CL 106  --   --  106  CO2 24  --   --  24  GLUCOSE 153*  --   --  142*  BUN <5*  --   --  5*  CREATININE 0.80  --   --   0.54  CALCIUM 8.1*  --   --  7.7*   < > = values in this interval not displayed.    PT/INR:  Recent Labs    08/04/21 1350  LABPROT 17.7*  INR 1.5*   ABG    Component Value Date/Time   PHART 7.346 (L) 08/05/2021 0105   HCO3 26.2 08/05/2021 0105   TCO2 28 08/05/2021 0105   ACIDBASEDEF 1.0 08/04/2021 2140   O2SAT 98.0 08/05/2021 0105   CBG (last 3)  Recent Labs    08/05/21 0358 08/05/21 0505 08/05/21 0637  GLUCAP 135* 116* 149*    Assessment/Plan: S/P Procedure(s) (LRB): RESECTION AORTIC VALVE TUMOR (N/A) CORONARY ARTERY BYPASS GRAFTING (CABG) X  4  USING LEFT INTERNAL MAMMARY ARTERY AND RIGHT GREATER SAPHENOUS VEIN ENDOSCOPIC CONDUITS (N/A) TRANSESOPHAGEAL ECHOCARDIOGRAM (TEE) (N/A) APPLICATION OF CELL SAVER NEURO- no new focal deficit CV- in SR with 1st degree block, hemodynamics stable- dc Swan and A line  ASA, beta blocker, statin- monitor rhythm with beta blocker  On Amiodarone drip for A fib/ flutter in OR  Hypertensive, restart amlodipine and hydralazine RESP- IS for basilar atelectasis RENAL- creatinine and lytes ok  Volume overload expected due to 3rd spacing- diurese ENDO- CBG controlled with insulin drip  Transition to levemir + SSI GI- advance diet as tolerated Anemia secondary to ABL- follow SCD +  enoxaparin for DVT prophylaxis Dc chest tubes OOB, ambulate with cardiac rehab, will consult PT to assist   LOS: 1 day    [name] 08/05/2021

## 2021-08-05 NOTE — Plan of Care (Signed)

## 2021-08-05 NOTE — TOC Initial Note (Signed)
6Transition of Care [facility]) - Initial/Assessment Note    Patient Details  Name: [name] MRN: [id number] Date of Birth: 08-16-1956  Transition of Care [facility]) CM/SW Contact:    Verdell Carmine, RN Phone Number: 08/05/2021, 5:23 PM  Clinical Narrative:                   65 YO female S/P CABG x 4 and  tumor excision. CIR has already evaluated post op day 1 for inpatient rehab, and will follow patient through hospitalization.   Expected Discharge Plan: IP Rehab Facility Barriers to Discharge: Continued Medical Work up   Patient Goals and CMS Choice        Expected Discharge Plan and Services Expected Discharge Plan: [place]       Living arrangements for the past 2 months: [place]                                      Prior Living Arrangements/Services Living arrangements for the past 2 months: Single Family Home   Patient language and need for interpreter reviewed:: Yes        Need for Family Participation in Patient Care: Yes (Comment) Care giver support system in place?: Yes (comment)   Criminal Activity/Legal Involvement Pertinent to Current Situation/Hospitalization: No - Comment as needed  Activities of Daily Living      Permission Sought/Granted                  Emotional Assessment Appearance:: Appears older than stated age     Orientation: : Oriented to Self, Oriented to Situation Alcohol / Substance Use: Not Applicable Psych Involvement: No (comment)  Admission diagnosis:  S/P CABG x 4 [Z95.1] Patient Active Problem List   Diagnosis Date Noted   S/P CABG x 4 08/04/2021   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   Hyperkalemia    Chronic bilateral low back pain without sciatica    Leukocytosis    Hyponatremia    Essential hypertension    Slow transit constipation    Hemiparesis affecting left side as late effect of stroke ([place])    Aortic valve mass    History of CVA with residual deficit 04/19/2021    Dyslipidemia, goal LDL below 70 04/18/2021   Hypertensive urgency 04/18/2021   Recurrent major depressive disorder, in partial remission ([place]) 09/20/2020   Status post total replacement of hip 04/04/2018   Primary osteoarthritis of right hip 02/20/2018   Allergic rhinitis 10/28/2015   Acute stress disorder 08/01/2015   Anxiety 08/01/2015   Clinical depression 08/01/2015   Diabetes ([place]) 08/01/2015   Diverticulosis of colon 08/01/2015   Generalized pruritus 08/01/2015   BP (high blood pressure) 08/01/2015   Cannot sleep 08/01/2015   Adiposity 08/01/2015   Detrusor muscle hypertonia 08/01/2015   Hypercholesterolemia without hypertriglyceridemia 08/01/2015   Female stress incontinence 08/01/2015   Avitaminosis D 08/01/2015   PCP:  Birdie Sons, MD Pharmacy:   [address] [place] [place] [address] Phone: [phone] Fax: [place] [phone] - [address] [place] [address] Phone: [phone] Fax: [phone]     Social Determinants of Health (SDOH) Interventions    Readmission Risk Interventions No flowsheet data found.

## 2021-08-06 ENCOUNTER — Inpatient Hospital Stay (HOSPITAL_COMMUNITY): Payer: 59

## 2021-08-06 LAB — CBC
HCT: 24.2 % — ABNORMAL LOW (ref 36.0–46.0)
Hemoglobin: 7.7 g/dL — ABNORMAL LOW (ref 12.0–15.0)
MCH: 28.5 pg (ref 26.0–34.0)
MCHC: 31.8 g/dL (ref 30.0–36.0)
MCV: 89.6 fL (ref 80.0–100.0)
Platelets: 147 10*3/uL — ABNORMAL LOW (ref 150–400)
RBC: 2.7 MIL/uL — ABNORMAL LOW (ref 3.87–5.11)
RDW: 13.6 % (ref 11.5–15.5)
WBC: 9.4 10*3/uL (ref 4.0–10.5)
nRBC: 0 % (ref 0.0–0.2)

## 2021-08-06 LAB — BASIC METABOLIC PANEL
Anion gap: 7 (ref 5–15)
BUN: 11 mg/dL (ref 8–23)
CO2: 29 mmol/L (ref 22–32)
Calcium: 8.5 mg/dL — ABNORMAL LOW (ref 8.9–10.3)
Chloride: 103 mmol/L (ref 98–111)
Creatinine, Ser: 1 mg/dL (ref 0.44–1.00)
GFR, Estimated: >60 mL/min (ref >60)
Glucose, Bld: 80 mg/dL (ref 70–99)
Potassium: 4 mmol/L (ref 3.5–5.1)
Sodium: 139 mmol/L (ref 135–145)

## 2021-08-06 LAB — GLUCOSE, CAPILLARY
Glucose-Capillary: 101 mg/dL — ABNORMAL HIGH (ref 70–99)
Glucose-Capillary: 133 mg/dL — ABNORMAL HIGH (ref 70–99)
Glucose-Capillary: 148 mg/dL — ABNORMAL HIGH (ref 70–99)
Glucose-Capillary: 74 mg/dL (ref 70–99)
Glucose-Capillary: 82 mg/dL (ref 70–99)
Glucose-Capillary: 96 mg/dL (ref 70–99)

## 2021-08-06 MED ORDER — TRAMADOL HCL 50 MG PO TABS
50.0000 mg | ORAL_TABLET | Freq: Four times a day (QID) | ORAL | Status: DC | PRN
  Administered 2021-08-06: 50 mg via ORAL
  Filled 2021-08-06: qty 1

## 2021-08-06 MED ORDER — INSULIN ASPART 100 UNIT/ML IJ SOLN
0.0000 [IU] | Freq: Three times a day (TID) | INTRAMUSCULAR | Status: DC
  Administered 2021-08-06 – 2021-08-08 (×2): 2 [IU] via SUBCUTANEOUS
  Administered 2021-08-08 – 2021-08-09 (×2): 3 [IU] via SUBCUTANEOUS
  Administered 2021-08-09 – 2021-08-10 (×2): 2 [IU] via SUBCUTANEOUS

## 2021-08-06 MED ORDER — POTASSIUM CHLORIDE CRYS ER 10 MEQ PO TBCR
20.0000 meq | EXTENDED_RELEASE_TABLET | Freq: Every day | ORAL | Status: DC
  Administered 2021-08-06 – 2021-08-10 (×5): 20 meq via ORAL
  Filled 2021-08-06 (×5): qty 2

## 2021-08-06 MED ORDER — INSULIN DETEMIR 100 UNIT/ML ~~LOC~~ SOLN
20.0000 [IU] | Freq: Every day | SUBCUTANEOUS | Status: DC
  Filled 2021-08-06 (×2): qty 0.2

## 2021-08-06 MED ORDER — FUROSEMIDE 40 MG PO TABS
40.0000 mg | ORAL_TABLET | Freq: Every day | ORAL | Status: DC
  Administered 2021-08-06 – 2021-08-10 (×5): 40 mg via ORAL
  Filled 2021-08-06 (×5): qty 1

## 2021-08-06 NOTE — Consult Note (Signed)
   [facility] Inpatient Consult   08/06/2021  ELSE MCGAUGHEY [birth year] [id number]   [employer] [ACO] Patient: [place] plan  Patient is currently assigned for [facility] for chronic disease management follow up.  Patient is currently in ICU.  Plan:  Patient is being recommended for rehab. Follow for disposition and transitional needs.  Continue to follow for transition needs.   For additional questions or referrals please contact:   Natividad Brood, RN BSN [facility] Liaison  [phone] business mobile phone Toll free office [phone]  Fax number: [phone] [email or web address]

## 2021-08-06 NOTE — Progress Notes (Signed)
Inpatient Rehab Admissions Coordinator:   Consult received and met with pt at the bedside. She is open to returning to CIR.  Said her home situation has not changed (home with her daughter and daughter' significant other, who provide 24/7 supervision).  I will follow for timing to open insurance.    Shann Medal, PT, DPT Admissions Coordinator [phone] 08/06/21  1:23 PM

## 2021-08-06 NOTE — Progress Notes (Signed)
Patient ID: [name], female   DOB: 18-Nov-1956, 65 y.o.   MRN: [id number] TCTS Evening Rounds:  Hemodynamically stable in sinus rhythm.  Sats 93% on RA.  Urine output ok  Seen by CIR today.

## 2021-08-06 NOTE — Evaluation (Signed)
Occupational Therapy Evaluation Patient Details Name: [name] MRN: [id number] DOB: [birth year] Today's Date: 08/06/2021    History of Present Illness 65 y.o. female admitted 8/22 for CABG x4 and aortic valve tumor resection. 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   Clinical Impression   PTA patient independent with ADLs, mobility and driving. Admitted for above and limited by problem list below, including sternal precautions and pain, L UE weakness and decreased coordination, impaired balance, and decreased activity tolerance.  Pt educated on sternal precautions and ADL compensatory techniques.  She requires min assist +2 for transfers using RW and up to total assist for ADLs.  She will benefit from further OT services while admitted and after dc at CIR level to optimize independence, safety with ADLs and mobility.     Follow Up Recommendations  CIR;Supervision/Assistance - 24 hour    Equipment Recommendations  Other (comment) (TBD)    Recommendations for Other Services Rehab consult     Precautions / Restrictions Precautions Precautions: Fall;Sternal Precaution Booklet Issued: Yes (comment) (pt gave last session) Precaution Comments: reviewed precautions, able to recall no push/pull but requires further education and cueing for adherence Restrictions Weight Bearing Restrictions: Yes Other Position/Activity Restrictions: sternal precautions      Mobility Bed Mobility               General bed mobility comments: In chair upon entry    Transfers Overall transfer level: Needs assistance Equipment used: Rolling walker (2 wheeled) Transfers: Sit to/from Stand Sit to Stand: +2 physical assistance;Min assist         General transfer comment: min assist +2 to power up and steady with cueing for hand placement and sequencing    Balance Overall  balance assessment: Needs assistance Sitting-balance support: No upper extremity supported;Feet supported Sitting balance-Leahy Scale: Fair     Standing balance support: Bilateral upper extremity supported;During functional activity Standing balance-Leahy Scale: Poor Standing balance comment: relies on BUE and external support                           ADL either performed or assessed with clinical judgement   ADL Overall ADL's : Needs assistance/impaired     Grooming: Minimal assistance;Sitting           Upper Body Dressing : Moderate assistance;Sitting   Lower Body Dressing: Total assistance;Sit to/from stand;+2 for safety/equipment;+2 for physical assistance     Toilet Transfer Details (indicate cue type and reason): sit to stand with min assist +2         Functional mobility during ADLs: Minimal assistance;+2 for physical assistance;+2 for safety/equipment General ADL Comments: pt limited by sternal precautions, L sided weakness from Hx of CVA and decreased activity tolerance     Vision Baseline Vision/History: 1 Wears glasses (reading) Patient Visual Report: No change from baseline Vision Assessment?: No apparent visual deficits     Perception     Praxis      Pertinent Vitals/Pain Pain Assessment: Faces Faces Pain Scale: Hurts little more Pain Location: incisional-chest, buttocks Pain Descriptors / Indicators: Discomfort Pain Intervention(s): Limited activity within patient's tolerance;Monitored during session;Repositioned     Hand Dominance Right   Extremity/Trunk Assessment Upper Extremity Assessment Upper Extremity Assessment: LUE deficits/detail LUE Deficits / Details: hx of weakness dued to CVA, grossly 3-/5 MMT with decreased coordination LUE Sensation: decreased light touch LUE  Coordination: decreased fine motor;decreased gross motor   Lower Extremity Assessment Lower Extremity Assessment: Defer to PT evaluation        Communication Communication Communication: No difficulties   Cognition Arousal/Alertness: Awake/alert Behavior During Therapy: WFL for tasks assessed/performed Overall Cognitive Status: Impaired/Different from baseline Area of Impairment: Memory;Awareness;Problem solving                     Memory: Decreased recall of precautions;Decreased short-term memory     Awareness: Emergent Problem Solving: Slow processing;Difficulty sequencing;Requires verbal cues General Comments: pt following commands, decreased recall of sternal precautions and requires cueing for probelm solving   General Comments  VSS on 2L     Exercises     Shoulder Instructions      Home Living Family/patient expects to be discharged to:: Private residence Living Arrangements: Children Available Help at Discharge: Family;Available 24 hours/day Type of Home: House Home Access: Stairs to enter [employer] of Steps: 3   Home Layout: One level     Bathroom Shower/Tub: Teacher, early years/pre: Standard     Home Equipment: Environmental consultant - 2 wheels;Cane - single point;Shower seat;Bedside commode;Wheelchair - manual          Prior Functioning/Environment Level of Independence: Independent with assistive device(s)        Comments: driving and independent with ADLs, daughter assist with meds and cooking/cleaning        OT Problem List: Decreased strength;Decreased range of motion;Decreased activity tolerance;Impaired balance (sitting and/or standing);Decreased cognition;Decreased coordination;Decreased safety awareness;Decreased knowledge of use of DME or AE;Decreased knowledge of precautions;Impaired UE functional use;Pain;Cardiopulmonary status limiting activity      OT Treatment/Interventions: Self-care/ADL training;Therapeutic exercise;DME and/or AE instruction;Therapeutic activities;Cognitive remediation/compensation;Patient/family education;Balance training;Neuromuscular  education;Energy conservation    OT Goals(Current goals can be found in the care plan section) Acute Rehab OT Goals Patient Stated Goal: to get better and be able to use my left arm OT Goal Formulation: With patient Time For Goal Achievement: 08/20/21 Potential to Achieve Goals: Good  OT Frequency: Min 3X/week   Barriers to D/C:            Co-evaluation              AM-PAC OT "6 Clicks" Daily Activity     Outcome Measure Help from another person eating meals?: A Little Help from another person taking care of personal grooming?: A Little Help from another person toileting, which includes using toliet, bedpan, or urinal?: Total Help from another person bathing (including washing, rinsing, drying)?: A Lot Help from another person to put on and taking off regular upper body clothing?: A Lot Help from another person to put on and taking off regular lower body clothing?: Total 6 Click Score: 12   End of Session Equipment Utilized During Treatment: Rolling walker;Oxygen Nurse Communication: Mobility status;Precautions  Activity Tolerance: Patient tolerated treatment well Patient left: in chair;with call bell/phone within reach  OT Visit Diagnosis: Other abnormalities of gait and mobility (R26.89);Muscle weakness (generalized) (M62.81);Pain;Hemiplegia and hemiparesis Hemiplegia - Right/Left: Left Hemiplegia - dominant/non-dominant: Non-Dominant Hemiplegia - caused by: Cerebral infarction Pain - part of body:  (sternal-incisonal and buttocks)                Time: [id number] OT Time Calculation (min): 22 min Charges:  OT General Charges $OT Visit: 1 Visit OT Evaluation $OT Eval Moderate Complexity: 1 Mod  Jolaine Artist, OT Acute Rehabilitation Services Pager [phone] Office [phone]   Delight Stare 08/06/2021, 12:09  PM

## 2021-08-06 NOTE — Progress Notes (Signed)
2 Days Post-Op Procedure(s) (LRB): RESECTION AORTIC VALVE TUMOR (N/A) CORONARY ARTERY BYPASS GRAFTING (CABG) X  4  USING LEFT INTERNAL MAMMARY ARTERY AND RIGHT GREATER SAPHENOUS VEIN ENDOSCOPIC CONDUITS (N/A) TRANSESOPHAGEAL ECHOCARDIOGRAM (TEE) (N/A) APPLICATION OF CELL SAVER Subjective: Sleepy this AM, oriented when aroused Denies pain, nausea. Has only taken clears, "don't like the food here"  Objective: Vital signs in last 24 hours: Temp:  [97.1 F (36.2 C)-98.4 F (36.9 C)] 97.6 F (36.4 C) (08/24 0400) Pulse Rate:  [59-80] 62 (08/24 0615) Cardiac Rhythm: Normal sinus rhythm (08/24 0400) Resp:  [11-21] 15 (08/24 0615) BP: (89-134)/(49-72) 120/66 (08/24 0615) SpO2:  [86 %-100 %] 98 % (08/24 0615) Arterial Line BP: (118-150)/(39-49) 118/43 (08/23 1500) Weight:  [91.5 kg] 91.5 kg (08/24 0500)  Hemodynamic parameters for last 24 hours: PAP: (26)/(13) 26/13 CVP:  [3 mmHg-9 mmHg] 3 mmHg  Intake/Output from previous day: 08/23 0701 - 08/24 0700 In: 972.7 [P.O.:290; I.V.:682.7] Out: 1105 [Urine:1105] Intake/Output this shift: No intake/output data recorded.  General appearance: alert, cooperative, and no distress Neurologic: left sided weakness and unchanged from baseline Heart: regular rate and rhythm Lungs: diminished breath sounds bibasilar Abdomen: normal findings: soft, non-tender  Lab Results: Recent Labs    08/05/21 1529 08/06/21 0406  WBC 11.2* 9.4  HGB 8.4* 7.7*  HCT 25.9* 24.2*  PLT 156 147*   BMET:  Recent Labs    08/05/21 1529 08/06/21 0406  NA 137 139  K 4.4 4.0  CL 104 103  CO2 25 29  GLUCOSE 189* 80  BUN 7* 11  CREATININE 0.93 1.00  CALCIUM 8.3* 8.5*    PT/INR:  Recent Labs    08/04/21 1350  LABPROT 17.7*  INR 1.5*   ABG    Component Value Date/Time   PHART 7.346 (L) 08/05/2021 0105   HCO3 26.2 08/05/2021 0105   TCO2 28 08/05/2021 0105   ACIDBASEDEF 1.0 08/04/2021 2140   O2SAT 98.0 08/05/2021 0105   CBG (last 3)  Recent Labs     08/05/21 2031 08/06/21 0032 08/06/21 0409  GLUCAP 135* 82 74    Assessment/Plan: S/P Procedure(s) (LRB): RESECTION AORTIC VALVE TUMOR (N/A) CORONARY ARTERY BYPASS GRAFTING (CABG) X  4  USING LEFT INTERNAL MAMMARY ARTERY AND RIGHT GREATER SAPHENOUS VEIN ENDOSCOPIC CONDUITS (N/A) TRANSESOPHAGEAL ECHOCARDIOGRAM (TEE) (N/A) APPLICATION OF CELL SAVER POD # 2 NEURO- motor exam at baseline, very drowsy this AM CV- in SR, BP oK  ASA, beta blocker, statin RESP- CXR shows atelectasis- needs to do better with IS RENAL- creatinine and lytes OK, will continue diuresis with PO Lasix GI- advance diet ENDO- CBG low will change levemir to once daily and SSI to [facility] and HS Anemia secondary to ABL- mild, follow Continue to mobilize, PT Probable CIR at dc   LOS: 2 days    [name] 08/06/2021

## 2021-08-07 ENCOUNTER — Inpatient Hospital Stay (HOSPITAL_COMMUNITY): Payer: 59

## 2021-08-07 LAB — GLUCOSE, CAPILLARY
Glucose-Capillary: 64 mg/dL — ABNORMAL LOW (ref 70–99)
Glucose-Capillary: 91 mg/dL (ref 70–99)
Glucose-Capillary: 107 mg/dL — ABNORMAL HIGH (ref 70–99)
Glucose-Capillary: 103 mg/dL — ABNORMAL HIGH (ref 70–99)
Glucose-Capillary: 115 mg/dL — ABNORMAL HIGH (ref 70–99)

## 2021-08-07 LAB — CBC
HCT: 24.5 % — ABNORMAL LOW (ref 36.0–46.0)
Hemoglobin: 7.9 g/dL — ABNORMAL LOW (ref 12.0–15.0)
MCH: 28.3 pg (ref 26.0–34.0)
MCHC: 32.2 g/dL (ref 30.0–36.0)
MCV: 87.8 fL (ref 80.0–100.0)
Platelets: 156 10*3/uL (ref 150–400)
RBC: 2.79 MIL/uL — ABNORMAL LOW (ref 3.87–5.11)
RDW: 13.4 % (ref 11.5–15.5)
WBC: 8.6 10*3/uL (ref 4.0–10.5)
nRBC: 0 % (ref 0.0–0.2)

## 2021-08-07 LAB — BASIC METABOLIC PANEL
Anion gap: 8 (ref 5–15)
BUN: 14 mg/dL (ref 8–23)
CO2: 26 mmol/L (ref 22–32)
Calcium: 8.3 mg/dL — ABNORMAL LOW (ref 8.9–10.3)
Chloride: 102 mmol/L (ref 98–111)
Creatinine, Ser: 0.88 mg/dL (ref 0.44–1.00)
GFR, Estimated: >60 mL/min (ref >60)
Glucose, Bld: 73 mg/dL (ref 70–99)
Potassium: 4 mmol/L (ref 3.5–5.1)
Sodium: 136 mmol/L (ref 135–145)

## 2021-08-07 MED ORDER — SODIUM CHLORIDE 0.9% FLUSH: 3.0000 mL | INTRAVENOUS | Status: DC | PRN

## 2021-08-07 MED ORDER — MAGNESIUM HYDROXIDE 400 MG/5ML PO SUSP: 30.0000 mL | Freq: Every day | ORAL | Status: DC | PRN

## 2021-08-07 MED ORDER — SODIUM CHLORIDE 0.9 % IV SOLN: 250.0000 mL | INTRAVENOUS | Status: DC | PRN

## 2021-08-07 MED ORDER — SODIUM CHLORIDE 0.9% FLUSH
3.0000 mL | Freq: Two times a day (BID) | INTRAVENOUS | Status: DC
  Administered 2021-08-07 – 2021-08-11 (×9): 3 mL via INTRAVENOUS

## 2021-08-07 MED ORDER — ~~LOC~~ CARDIAC SURGERY, PATIENT & FAMILY EDUCATION: Freq: Once | Status: AC

## 2021-08-07 NOTE — Progress Notes (Signed)
Inpatient Rehab Admissions Coordinator:   Per Dr. Roxan Hockey, pt probably ready for transition to next level of care early next week.  Will follow and start insurance auth today.    Shann Medal, PT, DPT Admissions Coordinator [phone] 08/07/21  11:00 AM

## 2021-08-07 NOTE — Discharge Summary (Signed)
[address]       [place] [address]             [phone]    Physician Discharge Summary  Patient ID: [name] MRN: [id number] DOB/AGE: 1956/02/08 65 y.o.  Admit date: 08/04/2021 Discharge date: 08/11/2021  Admission Diagnoses:  Patient Active Problem List   Diagnosis Date Noted   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   Hyperkalemia    Chronic bilateral low back pain without sciatica    Leukocytosis    Hyponatremia    Essential hypertension    Slow transit constipation    Hemiparesis affecting left side as late effect of stroke ([place])    Aortic valve mass    History of CVA with residual deficit 04/19/2021   Dyslipidemia, goal LDL below 70 04/18/2021   Hypertensive urgency 04/18/2021   Recurrent major depressive disorder, in partial remission ([place]) 09/20/2020   Status post total replacement of hip 04/04/2018   Primary osteoarthritis of right hip 02/20/2018   Allergic rhinitis 10/28/2015   Acute stress disorder 08/01/2015   Anxiety 08/01/2015   Clinical depression 08/01/2015   Diabetes ([place]) 08/01/2015   Diverticulosis of colon 08/01/2015   Generalized pruritus 08/01/2015   BP (high blood pressure) 08/01/2015   Cannot sleep 08/01/2015   Adiposity 08/01/2015   Detrusor muscle hypertonia 08/01/2015   Hypercholesterolemia without hypertriglyceridemia 08/01/2015   Female stress incontinence 08/01/2015   Avitaminosis D 08/01/2015   Discharge Diagnoses:  Patient Active Problem List   Diagnosis Date Noted   S/P CABG x 4 08/04/2021   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   Hyperkalemia    Chronic bilateral low back pain without sciatica    Leukocytosis    Hyponatremia    Essential hypertension    Slow transit constipation    Hemiparesis affecting left side as late effect of stroke ([place])    Aortic valve mass    History of CVA with residual deficit 04/19/2021   Dyslipidemia, goal LDL below 70 04/18/2021   Hypertensive  urgency 04/18/2021   Recurrent major depressive disorder, in partial remission ([place]) 09/20/2020   Status post total replacement of hip 04/04/2018   Primary osteoarthritis of right hip 02/20/2018   Allergic rhinitis 10/28/2015   Acute stress disorder 08/01/2015   Anxiety 08/01/2015   Clinical depression 08/01/2015   Diabetes ([place]) 08/01/2015   Diverticulosis of colon 08/01/2015   Generalized pruritus 08/01/2015   BP (high blood pressure) 08/01/2015   Cannot sleep 08/01/2015   Adiposity 08/01/2015   Detrusor muscle hypertonia 08/01/2015   Hypercholesterolemia without hypertriglyceridemia 08/01/2015   Female stress incontinence 08/01/2015   Avitaminosis D 08/01/2015   Discharged Condition: good  History of Present Illness:  [name] is sent for consultation regarding a papillary fibroelastoma of the aortic valve and coronary artery disease.  [name] is a 65 year old woman with a medical history of coronary artery disease, fibroblastoma of the aortic valve, embolic right thalamic infarction, hypertension, hyperlipidemia, type 2 diabetes, anxiety, arthritis, reflux and depression.  She was in her usual state of health until May when she developed acute onset of dizziness, left-sided weakness, left-sided numbness, and slurred speech.  She was taken to the emergency room at [facility] as a code stroke.  Her speech improved rapidly.  Initial CT was negative.  She was hypertensive.  Neurology was consulted and she was treated with tPA.  An MRI showed a small right thalamic infarct and occlusion of the segmental branch of  the right posterior cerebral artery.  She was transferred to [facility].  She underwent retrieval of the embolus by IR.  She had a TEE which showed normal left ventricular function with a large mobile mass on the aortic valve consistent with a papillary fibroblastoma.  There was no aortic stenosis or insufficiency.   She then went through inpatient rehab.  She currently is  staying with her daughter.  She still have some numbness in her left arm greater than leg and also some weakness in her left hand.  Speech is normal and she is able to walk with a cane or walker.  She denied having any chest pain, pressure, tightness, or shortness of breath.  She was evaluated by Dr. Roxan Hockey who felt patient would benefit from coronary artery bypass grafting procedure, resection of papillary fibroelastoma, and possible Aortic Valve Replacement.  The risks and benefits of the procedure were explained to the patient and she was agreeable to proceed.  After her consultation she contracted COVID resulting in her surgery being rescheduled.  She recovered from this without difficulty.  Hospital Course:  [name] presented to [facility] on 08/04/2021.  She was taken to the operating room and underwent CABG x 4 utilizing LIMA to LAD, SVG to PDA, and Sequential SVG to OM and Diagonal.  She also underwent resection of an aortic valve mass, most likely to be a papillary fibroelastoma.  Finally she underwent endoscopic harvest of greater saphenous vein from her right leg.  She tolerated the procedure without difficulty and was taken to the SICU in stable condition.  She was extubated the evening of surgery.  During her stay in the SICU her post operative rhythm was NSR with 1st degree AV block.  She underwent debridement of chest tubes, and arterial lines.  She required IV Amiodarone in the operating room due to Atrial Flutter.  This was converted to an oral regimen.  She was started on diuretics for volume overloaded state.  The patient remained somnolent which is her baseline.  PT evaluation was requested for possible CIR placement.  Due to her recent stroke PT/OT evaluations were performed and recommended CIR placement.  She developed hypoglycemia and her insulin was discontinued.  She remains in NSR and was felt stable for transfer to the progressive care unit on 08/07/2021.  The patient  continued to maintain NSR.  Her pacing wires were removed without difficulty.  The patient remained hypertensive and her home Cozaar was restarted and titrated as needed.  She continued to participate with PT/OT and CIR placement has been authorized by her insurance.  She remains clinically stable.  Her surgical incisions are healing without evidence of infection.  She does have some ecchymosis along her RLE.  As of 08/29, she is on Amlodipine 10 mg daily, Hydralazine 10 mg at hs, Losartan 100 mg daily, and Lopressor 12.5 mg bid with good BP control. She is still above pre op weight so she will be continued on daily Lasix and potassium supplement for the next 5 days. Chest tube sutures will be removed the day of discharge to CIR.She is felt medically stable for discharge to CIR today.  Consults: rehabilitation medicine  Significant Diagnostic Studies:   CT Coronary Morphology:  Coronary artery calcium (CAC) score is a strong predictor of incident coronary heart disease (CHD) and provides predictive information beyond traditional risk factors. CAC scoring is reasonable to use in the decision to withhold, postpone, or initiate statin therapy in intermediate-risk or selected  borderline-risk asymptomatic adults (age 88-75 years and LDL-C >=70 to <190 mg/dL) who do not have diabetes or established atherosclerotic cardiovascular disease (ASCVD).* In intermediate-risk (10-year ASCVD risk >=7.5% to <20%) adults or selected borderline-risk (10-year ASCVD risk >=5% to <7.5%) adults in whom a CAC score is measured for the purpose of making a treatment decision the following recommendations have been made:   If CAC = 0, it is reasonable to withhold statin therapy and reassess in 5 to 10 years, as long as higher risk conditions are absent (diabetes mellitus, family history of premature CHD in first degree relatives (males <55 years; females <65 years), cigarette smoking, LDL >=190 mg/dL or other  independent risk factors).   If CAC is 1 to 99, it is reasonable to initiate statin therapy for patients >= 84 years of age.   If CAC is >=100 or >=75th percentile, it is reasonable to initiate statin therapy at any age.   Cardiology referral should be considered for patients with CAC scores =400 or >=75th percentile.   *2018 AHA/ACC/AACVPR/AAPA/ABC/ACPM/ADA/AGS/APhA/ASPC/NLA/PCNA Guideline on the Management of Blood Cholesterol: A Report of the American College of Cardiology/American Heart Association Task Force on Clinical Practice Guidelines. J Am Coll Cardiol. 2019;73(24):3168-3209.   Rudean Haskell, MD  Echocardiography:  IMPRESSIONS     1. Left ventricular ejection fraction, by estimation, is 60 to 65%. The  left ventricle has normal function. The left ventricle has no regional  wall motion abnormalities.   2. Prominent papillary muscle is noted. Right ventricular systolic  function is normal. The right ventricular size is normal.   3. Left atrial size was mildly dilated. No left atrial/left atrial  appendage thrombus was detected.   4. The mitral valve is normal in structure. No evidence of mitral valve  regurgitation. No evidence of mitral stenosis.   5. The aortic valve is abnormal. Trileaflet aortic valve with large well  defined shimmering highly mobile mass on the aortic side of the valve. 3D  images obtained to try to delineate the origin of the mass as difficult to  discern whether this is  originating from the valve leaflet or the aortic wall. 3D images  suggestive of origin of mass off the aortic valve cusp and most consistent  with papillary fibroelastoma. There is no significant AI. Aortic valve  regurgitation is not visualized. No aortic  stenosis is present.   6. The inferior vena cava is normal in size with greater than 50%  respiratory variability, suggesting right atrial pressure of 3 mmHg.   7. Agitated saline contrast bubble study was  negative, with no evidence  of any interatrial shunt.    PATHOLOGY:  SURGICAL PATHOLOGY  CASE: [id number]  PATIENT: [name]  Surgical Pathology Report   Clinical History: aortic valve fibroelastoma, CAD ([place])   FINAL MICROSCOPIC DIAGNOSIS:   A. HEART VALVE TUMOR, AORTIC:  - Papillary fibroelastoma.   B. HEART VALVE LEAFLET, LEFT CORONARY:  - Papillary fibroelastoma.   Treatments: surgery:   Operative Report    DATE OF PROCEDURE: 08/04/2021   PREOPERATIVE DIAGNOSES:  Fibroelastoma of aortic valve and 3-vessel coronary artery disease.   POSTOPERATIVE DIAGNOSES:  Fibroelastoma of aortic valve and 3-vessel coronary artery disease.   PROCEDURES PERFORMED:  Median sternotomy, extracorporeal circulation, resection of fibroelastoma from aortic valve,  coronary artery bypass grafting x4 (left internal mammary artery to LAD, sequential saphenous vein graft to diagonal 1 and obtuse marginal and saphenous vein graft to posterior descending), endoscopic vein harvest right leg.   SURGEON:  Modesto Charon, MD   ASSISTANT:  Ellwood Handler PA-C  Discharge Exam: Blood pressure (!) 108/52, pulse 86, temperature 97.9 F (36.6 C), temperature source Oral, resp. rate 18, height 5' 7"  (1.702 m), weight 89.3 kg, SpO2 90 %.  Cardiovascular: RRR Pulmonary: Slightly diminished left basilar breath sound Abdomen: Soft, non tender, bowel sounds present. Extremities: Trace bilateral lower extremity edema. Mild right thigh ecchymosis/bruising Wounds: Clean and dry.  No erythema or signs of infection.  Discharge Medications:  The patient has been discharged on:   1.Beta Blocker:  Yes [  X ]                              No   [   ]                              If No, reason:  2.Ace Inhibitor/ARB: Yes [ X  ]                                     No  [    ]                                     If No, reason:  3.Statin:   Yes [  X ]                  No  [   ]                  If No,  reason:  4.Ecasa:  Yes  [ X  ]                  No   [   ]                  If No, reason:  Patient had ACS upon admission:NO  Plavix/P2Y12 inhibitor: Yes [   ]                                      No  [  X ]      Allergies as of 08/11/2021       Reactions   Jardiance [empagliflozin] Itching   Recurrent mycotic infections   Penicillins Rash, Other (See Comments)   Has patient had a PCN reaction causing immediate rash, facial/tongue/throat swelling, SOB or lightheadedness with hypotension: No Has patient had a PCN reaction causing severe rash involving mucus membranes or skin necrosis: No Has patient had a PCN reaction that required hospitalization No Has patient had a PCN reaction occurring within the last 10 years: No If all of the above answers are "NO", then may proceed with Cephalosporin use.        Medication List     STOP taking these medications    aspirin 81 MG chewable tablet Replaced by: aspirin 325 MG EC tablet   saccharomyces boulardii 250 MG capsule Commonly known as: FLORASTOR   senna-docusate 8.6-50 MG tablet Commonly known as: Senokot-S       TAKE these medications    acetaminophen 325 MG tablet Commonly known as: TYLENOL Take 2  tablets (650 mg total) by mouth every 4 (four) hours as needed for mild pain (or temp > 37.5 C (99.5 F)).   ALPRAZolam 0.5 MG tablet Commonly known as: XANAX TAKE 1 TABLET BY MOUTH 2 TIMES DAILY AS NEEDED FOR ANXIETY. What changed: how much to take   amLODipine 10 MG tablet Commonly known as: NORVASC Take 1 tablet (10 mg total) by mouth at bedtime.   aspirin 325 MG EC tablet Take 1 tablet (325 mg total) by mouth daily. Replaces: aspirin 81 MG chewable tablet   atorvastatin 40 MG tablet Commonly known as: LIPITOR Take 1 tablet (40 mg total) by mouth daily.   diclofenac Sodium 1 % Gel Commonly known as: VOLTAREN Apply 2 g topically 4 (four) times daily as needed (pain). Start taking on: September 01, 2021 What changed: These instructions start on September 01, 2021. If you are unsure what to do until then, ask your doctor or other care provider.   FreeStyle Freedom Lite w/Device Kit USE AS DIRECTED   freestyle lancets USE TO CHECK BLOOD SUGAR UP TO 4 TIMES A DAY   FREESTYLE LITE test strip Generic drug: glucose blood Also needs Lancets. Use to check blood sugar up to four times a day for insulin dependant diabetes   furosemide 40 MG tablet Commonly known as: LASIX Take 1 tablet (40 mg total) by mouth daily.   gabapentin 300 MG capsule Commonly known as: NEURONTIN Take 1 capsule (300 mg total) by mouth at bedtime. For neuropathic pain   glyBURIDE 5 MG tablet Commonly known as: DIABETA Take 1 tablet (5 mg total) by mouth daily with breakfast.   hydrALAZINE 10 MG tablet Commonly known as: APRESOLINE Take 1 tablet (10 mg total) by mouth at bedtime.   losartan 50 MG tablet Commonly known as: COZAAR Take 1 tablet (50 mg total) by mouth 2 (two) times daily.   metFORMIN 500 MG tablet Commonly known as: GLUCOPHAGE Take 2 tablets (1,000 mg total) by mouth 2 (two) times daily with a meal.   methocarbamol 500 MG tablet Commonly known as: ROBAXIN Take 1 tablet (500 mg total) by mouth 2 (two) times daily as needed for muscle spasms.   metoprolol tartrate 25 MG tablet Commonly known as: LOPRESSOR Take 0.5 tablets (12.5 mg total) by mouth 2 (two) times daily.   multivitamin tablet Take 1 tablet by mouth daily.   ondansetron 4 MG disintegrating tablet Commonly known as: Zofran ODT Take 1 tablet (4 mg total) by mouth every 8 (eight) hours as needed for nausea or vomiting.   pantoprazole 40 MG tablet Commonly known as: PROTONIX Take 1 tablet (40 mg total) by mouth at bedtime.   potassium chloride SA 20 MEQ tablet Commonly known as: KLOR-CON Take 1 tablet (20 mEq total) by mouth daily.   rOPINIRole 0.5 MG tablet Commonly known as: REQUIP Take 1 tablet (0.5 mg total) by  mouth at bedtime.   Semglee (yfgn) 100 UNIT/ML Pen Generic drug: insulin glargine-yfgn Inject 10 Units into the skin 2 (two) times daily.   Semglee (yfgn) 100 UNIT/ML Pen Generic drug: insulin glargine-yfgn Inject 20 Units into the skin every evening.   sertraline 100 MG tablet Commonly known as: ZOLOFT Take 1 tablet (100 mg total) by mouth daily.   traMADol 50 MG tablet Commonly known as: ULTRAM Take 1 tablet (50 mg total) by mouth every 6 (six) hours as needed for moderate pain or severe pain.   Unifine Pentips 31G X 6 MM Misc Generic drug:  Insulin Pen Needle use with saxenda twice daily        Follow-up [facility] Follow up on 08/22/2021.   Specialty: Cardiology Why: Appointment is at 2:00 Contact information: [address] [place] [address] [phone]        [name], MD Follow up on 09/02/2021.   Specialty: Cardiothoracic Surgery Why: Appointment is at 4:00, please get CXR at 3:30 at [facility] located on first floor of our office building Contact information: [place] [address] [phone]         Birdie Sons, MD. Call.   Specialty: Family Medicine Why: for a follow up appointment regarding further diabetes management and surveillance of HGA1C 7.8 Contact information: [address] [place] [address] [phone]         Werner Lean, MD .   Specialty: Cardiology Contact information: [place] [address] [phone]                 Signed: Lars Pinks PA-C 08/11/2021, 8:21 AM

## 2021-08-07 NOTE — Progress Notes (Signed)
EVENING ROUNDS NOTE :     [address]       [place], [address]             [phone]                 3 Days Post-Op Procedure(s) (LRB): RESECTION AORTIC VALVE TUMOR (N/A) CORONARY ARTERY BYPASS GRAFTING (CABG) X  4  USING LEFT INTERNAL MAMMARY ARTERY AND RIGHT GREATER SAPHENOUS VEIN ENDOSCOPIC CONDUITS (N/A) TRANSESOPHAGEAL ECHOCARDIOGRAM (TEE) (N/A) APPLICATION OF CELL SAVER   Total Length of Stay:  LOS: 3 days  Events:   No events Awaiting transfer    BP 126/72   Pulse 64   Temp 98.1 F (36.7 C) (Oral)   Resp 16   Ht '5\' 7"'$  (1.702 m)   Wt 95.3 kg   SpO2 97%   BMI 32.91 kg/m          sodium chloride      I/O last 3 completed shifts: In: 23 [P.O.:600; I.V.:3] Out: 650 [Urine:650]   CBC Latest Ref Rng & Units 08/07/2021 08/06/2021 08/05/2021  WBC 4.0 - 10.5 K/uL 8.6 9.4 11.2(H)  Hemoglobin 12.0 - 15.0 g/dL 7.9(L) 7.7(L) 8.4(L)  Hematocrit 36.0 - 46.0 % 24.5(L) 24.2(L) 25.9(L)  Platelets 150 - 400 K/uL 156 147(L) 156    BMP Latest Ref Rng & Units 08/07/2021 08/06/2021 08/05/2021  Glucose 70 - 99 mg/dL 73 80 189(H)  BUN 8 - 23 mg/dL 14 11 7(L)  Creatinine 0.44 - 1.00 mg/dL 0.88 1.00 0.93  BUN/Creat Ratio 12 - 28 - - -  Sodium 135 - 145 mmol/L 136 139 137  Potassium 3.5 - 5.1 mmol/L 4.0 4.0 4.4  Chloride 98 - 111 mmol/L 102 103 104  CO2 22 - 32 mmol/L '26 29 25  '$ Calcium 8.9 - 10.3 mg/dL 8.3(L) 8.5(L) 8.3(L)    ABG    Component Value Date/Time   PHART 7.346 (L) 08/05/2021 0105   PCO2ART 48.3 (H) 08/05/2021 0105   PO2ART 112 (H) 08/05/2021 0105   HCO3 26.2 08/05/2021 0105   TCO2 28 08/05/2021 0105   ACIDBASEDEF 1.0 08/04/2021 2140   O2SAT 98.0 08/05/2021 0105       Melodie Bouillon, MD 08/07/2021 5:51 PM

## 2021-08-07 NOTE — Progress Notes (Signed)
Physical Therapy Treatment Patient Details Name: [name] MRN: [id number] DOB: 23-Apr-1956 Today's Date: 08/07/2021    History of Present Illness 65 y.o. female admitted 8/22 for CABG x4 and aortic valve tumor resection. 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    PT Comments    Pt in chair on arrival and willing to participate. Pt continues to struggle with impaired balance, increased weakness and difficulty with gait. Pt performed 3 limited gait trials with mod +2 assist with progressive fatigue with transfers. Pt with precautions reinforced and D/C plan remains appropriate.   HR 70-76 Pre session 155/71 (94) Post 156/65 (92) SpO2 91-94% RA    Follow Up Recommendations  CIR     Equipment Recommendations  None recommended by PT    Recommendations for Other Services       Precautions / Restrictions Precautions Precautions: Fall;Sternal Precaution Comments: reviewed precautions pt not able to recall, pacer    Mobility  Bed Mobility Overal bed mobility: Needs Assistance Bed Mobility: Sit to Supine       Sit to supine: Mod assist   General bed mobility comments: mod cues for sequence and physical assist to lift legs and position trunk in midline    Transfers Overall transfer level: Needs assistance   Transfers: Sit to/from Stand Sit to Stand: Mod assist;Min assist         General transfer comment: initial stand min +2, 2nd 2 trials mod +2 with max cues for hand placement, sequence and assist to rise  Ambulation/Gait Ambulation/Gait assistance: Mod assist;+2 safety/equipment Gait Distance (Feet): 10 Feet Assistive device: Rolling walker (2 wheeled) Gait Pattern/deviations: Step-to pattern;Trunk flexed;Drifts right/left   Gait velocity interpretation: <1.8 ft/sec, indicate of risk for recurrent falls General Gait Details: pt walked 3', 10',  10' with RW left lean, left foot drag and decreased grasp on LUE. Physical assist for balance and RW control with close chair follow. Pt fatigues quickly   Stairs             Wheelchair Mobility    Modified Rankin (Stroke Patients Only)       Balance Overall balance assessment: Needs assistance Sitting-balance support: No upper extremity supported;Feet supported Sitting balance-Leahy Scale: Fair Sitting balance - Comments: EOB with pt with tendency for right posterior lean seated and needs cues for midline   Standing balance support: Bilateral upper extremity supported;During functional activity   Standing balance comment: relies on BUE and external support                            Cognition Arousal/Alertness: Awake/alert Behavior During Therapy: WFL for tasks assessed/performed Overall Cognitive Status: Impaired/Different from baseline Area of Impairment: Memory;Awareness;Problem solving                     Memory: Decreased recall of precautions;Decreased short-term memory       Problem Solving: Slow processing;Difficulty sequencing;Requires verbal cues General Comments: pt following commands, decreased recall of sternal precautions and requires cueing for problem solving      Exercises General Exercises - Lower Extremity Long Arc Quad: AROM;Both;Seated;15 reps Hip Flexion/Marching: AROM;Both;15 reps;Seated    General Comments        Pertinent Vitals/Pain Pain Score: 4  Pain Location: incisional-chest with movement, none at rest Pain Descriptors / Indicators: Discomfort Pain Intervention(s): Limited activity within patient's tolerance;Repositioned;Monitored during  session    Home Living                      Prior Function            PT Goals (current goals can now be found in the care plan section) Progress towards PT goals: Progressing toward goals    Frequency    Min 3X/week      PT Plan Current plan  remains appropriate    Co-evaluation              AM-PAC PT "6 Clicks" Mobility   Outcome Measure  Help needed turning from your back to your side while in a flat bed without using bedrails?: A Little Help needed moving from lying on your back to sitting on the side of a flat bed without using bedrails?: A Lot Help needed moving to and from a bed to a chair (including a wheelchair)?: A Lot Help needed standing up from a chair using your arms (e.g., wheelchair or bedside chair)?: Total Help needed to walk in hospital room?: Total Help needed climbing 3-5 steps with a railing? : Total 6 Click Score: 10    End of Session Equipment Utilized During Treatment: Gait belt Activity Tolerance: Patient tolerated treatment well Patient left: in bed;with call bell/phone within reach;with bed alarm set Nurse Communication: Mobility status;Precautions PT Visit Diagnosis: Unsteadiness on feet (R26.81);Other abnormalities of gait and mobility (R26.89);Muscle weakness (generalized) (M62.81)     Time: [id number] PT Time Calculation (min) (ACUTE ONLY): 41 min  Charges:  $Gait Training: 8-22 mins $Therapeutic Exercise: 8-22 mins $Therapeutic Activity: 8-22 mins                     Royal Vandevoort P, PT Acute Rehabilitation Services Pager: [phone] Office: [place] 08/07/2021, 10:19 AM

## 2021-08-07 NOTE — Progress Notes (Signed)
Patient transferred from the ICU. Pacing wires wrapped by ICU nurse during handoff. VSS, 2L , assessment completed. Incisions CDI. Patient oriented to room and unit. Bed locked and low. Report to be given to oncoming RN.

## 2021-08-07 NOTE — Progress Notes (Signed)
3 Days Post-Op Procedure(s) (LRB): RESECTION AORTIC VALVE TUMOR (N/A) CORONARY ARTERY BYPASS GRAFTING (CABG) X  4  USING LEFT INTERNAL MAMMARY ARTERY AND RIGHT GREATER SAPHENOUS VEIN ENDOSCOPIC CONDUITS (N/A) TRANSESOPHAGEAL ECHOCARDIOGRAM (TEE) (N/A) APPLICATION OF CELL SAVER Subjective: Feels a little better today, appetite remains poor  Objective: Vital signs in last 24 hours: Temp:  [97.6 F (36.4 C)-98.2 F (36.8 C)] 98.1 F (36.7 C) (08/25 0300) Pulse Rate:  [60-84] 71 (08/25 0700) Cardiac Rhythm: Normal sinus rhythm (08/25 0730) Resp:  [13-17] 15 (08/25 0700) BP: (100-169)/(51-109) 134/55 (08/25 0700) SpO2:  [93 %-100 %] 98 % (08/25 0700) Weight:  [95.3 kg] 95.3 kg (08/25 0500)  Hemodynamic parameters for last 24 hours:    Intake/Output from previous day: 08/24 0701 - 08/25 0700 In: 363 [P.O.:360; I.V.:3] Out: 350 [Urine:350] Intake/Output this shift: No intake/output data recorded.  General appearance: alert, cooperative, and no distress Neurologic: unchanged Heart: regular rate and rhythm Lungs: diminished breath sounds bibasilar Wound: dressing clean and dry  Lab Results: Recent Labs    08/06/21 0406 08/07/21 0036  WBC 9.4 8.6  HGB 7.7* 7.9*  HCT 24.2* 24.5*  PLT 147* 156   BMET:  Recent Labs    08/06/21 0406 08/07/21 0036  NA 139 136  K 4.0 4.0  CL 103 102  CO2 29 26  GLUCOSE 80 73  BUN 11 14  CREATININE 1.00 0.88  CALCIUM 8.5* 8.3*    PT/INR:  Recent Labs    08/04/21 1350  LABPROT 17.7*  INR 1.5*   ABG    Component Value Date/Time   PHART 7.346 (L) 08/05/2021 0105   HCO3 26.2 08/05/2021 0105   TCO2 28 08/05/2021 0105   ACIDBASEDEF 1.0 08/04/2021 2140   O2SAT 98.0 08/05/2021 0105   CBG (last 3)  Recent Labs    08/06/21 2131 08/07/21 0611 08/07/21 0647  GLUCAP 133* 64* 91    Assessment/Plan: S/P Procedure(s) (LRB): RESECTION AORTIC VALVE TUMOR (N/A) CORONARY ARTERY BYPASS GRAFTING (CABG) X  4  USING LEFT INTERNAL  MAMMARY ARTERY AND RIGHT GREATER SAPHENOUS VEIN ENDOSCOPIC CONDUITS (N/A) TRANSESOPHAGEAL ECHOCARDIOGRAM (TEE) (N/A) APPLICATION OF CELL SAVER Plan for transfer to step-down: see transfer orders NEURO- recent stroke, exam unchanged, more alert this AM CV- in Sr, BP OK RESP- continue IS for atelectasis RENAL- creatinine and lytes OK, continue PO Lasix ENDO- CBG low this AM, will stop levemir GI- tolerating Po but appetite poor HEME- anemia secondary to ABL- mild, follow Deconditioning- continue PT/OT, for CIR at dc   LOS: 3 days    [name] 08/07/2021

## 2021-08-07 NOTE — Discharge Instructions (Signed)

## 2021-08-08 ENCOUNTER — Other Ambulatory Visit: Payer: Self-pay

## 2021-08-08 LAB — CBC
HCT: 25.6 % — ABNORMAL LOW (ref 36.0–46.0)
Hemoglobin: 8.6 g/dL — ABNORMAL LOW (ref 12.0–15.0)
MCH: 29.3 pg (ref 26.0–34.0)
MCHC: 33.6 g/dL (ref 30.0–36.0)
MCV: 87.1 fL (ref 80.0–100.0)
Platelets: 193 10*3/uL (ref 150–400)
RBC: 2.94 MIL/uL — ABNORMAL LOW (ref 3.87–5.11)
RDW: 13.1 % (ref 11.5–15.5)
WBC: 9.2 10*3/uL (ref 4.0–10.5)
nRBC: 0 % (ref 0.0–0.2)

## 2021-08-08 LAB — GLUCOSE, CAPILLARY
Glucose-Capillary: 109 mg/dL — ABNORMAL HIGH (ref 70–99)
Glucose-Capillary: 160 mg/dL — ABNORMAL HIGH (ref 70–99)
Glucose-Capillary: 130 mg/dL — ABNORMAL HIGH (ref 70–99)
Glucose-Capillary: 102 mg/dL — ABNORMAL HIGH (ref 70–99)

## 2021-08-08 LAB — BASIC METABOLIC PANEL
Anion gap: 8 (ref 5–15)
BUN: 15 mg/dL (ref 8–23)
CO2: 27 mmol/L (ref 22–32)
Calcium: 8.4 mg/dL — ABNORMAL LOW (ref 8.9–10.3)
Chloride: 101 mmol/L (ref 98–111)
Creatinine, Ser: 0.76 mg/dL (ref 0.44–1.00)
GFR, Estimated: >60 mL/min (ref >60)
Glucose, Bld: 132 mg/dL — ABNORMAL HIGH (ref 70–99)
Potassium: 4.2 mmol/L (ref 3.5–5.1)
Sodium: 136 mmol/L (ref 135–145)

## 2021-08-08 MED ORDER — TRAMADOL HCL 50 MG PO TABS
50.0000 mg | ORAL_TABLET | Freq: Four times a day (QID) | ORAL | Status: DC | PRN
  Administered 2021-08-08 – 2021-08-10 (×3): 50 mg via ORAL
  Filled 2021-08-08 (×3): qty 1

## 2021-08-08 MED ORDER — LOSARTAN POTASSIUM 50 MG PO TABS
50.0000 mg | ORAL_TABLET | Freq: Every day | ORAL | Status: DC
  Administered 2021-08-08 – 2021-08-09 (×2): 50 mg via ORAL
  Filled 2021-08-08 (×2): qty 1

## 2021-08-08 NOTE — Plan of Care (Signed)
  Problem: Activity: Goal: Risk for activity intolerance will decrease Outcome: Progressing   Problem: Clinical Measurements: Goal: Postoperative complications will be avoided or minimized Outcome: Progressing   Problem: Skin Integrity: Goal: Wound healing without signs and symptoms of infection Outcome: Progressing Goal: Risk for impaired skin integrity will decrease Outcome: Progressing   Problem: Education: Goal: Knowledge of General Education information will improve Description: Including pain rating scale, medication(s)/side effects and non-pharmacologic comfort measures Outcome: Progressing   Problem: Health Behavior/Discharge Planning: Goal: Ability to manage health-related needs will improve Outcome: Progressing   Problem: Clinical Measurements: Goal: Ability to maintain clinical measurements within normal limits will improve Outcome: Progressing Goal: Will remain free from infection Outcome: Progressing   Problem: Nutrition: Goal: Adequate nutrition will be maintained Outcome: Progressing

## 2021-08-08 NOTE — Progress Notes (Signed)
Occupational Therapy Treatment Patient Details Name: [name] MRN: [id number] DOB: [birth year] Today's Date: 08/08/2021    History of present illness 65 y.o. female admitted 8/22 for CABG x4 and aortic valve tumor resection. 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   OT comments  Patient supine in bed and agreeable to OT.  Requires mod assist for bed mobility, mod assist for sit to stand from EOB and total assist for LB dressing. Pt engaged in self feeding from EOB (eating pears) with cueing to use L UE as stabilizer (and encouragement for her to hold her drink) and reports no appetite/not eating the hospital food.  Discussed options for possibly family to bring in food, RN to talk to pt more about this. Pt continues to require cueing for sternal precaution adherence during session, improved recall initially (only forgetting reaching behind back). Will follow acutely.      Follow Up Recommendations  CIR;Supervision/Assistance - 24 hour    Equipment Recommendations  Other (comment) (TBD)    Recommendations for Other Services Rehab consult    Precautions / Restrictions Precautions Precautions: Fall;Sternal Precaution Comments: reviewed precautions, requires min cueing to recall and adhere Restrictions Weight Bearing Restrictions: No Other Position/Activity Restrictions: sternal precautions       Mobility Bed Mobility Overal bed mobility: Needs Assistance Bed Mobility: Sidelying to Sit;Rolling;Sit to Sidelying Rolling: Min assist Sidelying to sit: Mod assist    Sit to sidelying: Mod assist General bed mobility comments: min assist to roll and mod assist to fully ascend to EOB for trunk and LB support    Transfers Overall transfer level: Needs assistance Equipment used: Rolling walker (2 wheeled) Transfers: Sit to/from [facility] Sit to  Stand: Mod assist         General transfer comment: sit to stand from EOB with mod assist to fully power up and steady using RW, cueing for hand placement and safety    Balance Overall balance assessment: Needs assistance Sitting-balance support: Feet supported;No upper extremity supported Sitting balance-Leahy Scale: Fair Sitting balance - Comments: limited dynamically   Standing balance support: Bilateral upper extremity supported;During functional activity Standing balance-Leahy Scale: Poor Standing balance comment: relies on BUE and external support                           ADL either performed or assessed with clinical judgement   ADL Overall ADL's : Needs assistance/impaired Eating/Feeding: Set up;Sitting Eating/Feeding Details (indicate cue type and reason): cueing to use L UE as stabilizer             Upper Body Dressing : Moderate assistance;Sitting   Lower Body Dressing: Total assistance;Sit to/from stand Lower Body Dressing Details (indicate cue type and reason): requires assist for socks, min assist sit to stand Toilet Transfer: Moderate assistance;RW Toilet Transfer Details (indicate cue type and reason): mod assist to side step towards [facility] with RW         Functional mobility during ADLs: Minimal assistance;Rolling walker;Cueing for safety;Cueing for sequencing General ADL Comments: pt limited by sternal precautions, L sided weakness from Hx of CVA and decreased activity tolerance     Vision       Perception     Praxis      Cognition Arousal/Alertness: Awake/alert Behavior During Therapy: Flat affect Overall Cognitive Status: Impaired/Different from baseline Area of Impairment: Memory;Awareness;Problem solving  Memory: Decreased recall of precautions;Decreased short-term memory     Awareness: Emergent Problem Solving: Slow processing;Difficulty sequencing;Requires verbal cues General Comments: pt  following commands, decreased recall of sternal precautions and requires cueing for problem solving        Exercises General Exercises - Lower Extremity Long Arc Quad: AROM;Both;Seated;20 reps Hip Flexion/Marching: AROM;Both;Seated;15 reps   Shoulder Instructions       General Comments VSS on RA; pt reports not eating and appears very fatgiued.  Assisted with pears but unable to eat further.  Agreeable to trying rice krispies and RN notified, spoke to RN about talking to pt about different food her family can bring in for her.    Pertinent Vitals/ Pain       Pain Assessment: Faces Faces Pain Scale: Hurts a little bit Pain Location: chest incisonal Pain Descriptors / Indicators: Discomfort Pain Intervention(s): Monitored during session;Repositioned  Home Living                                          Prior Functioning/Environment              Frequency  Min 3X/week        Progress Toward Goals  OT Goals(current goals can now be found in the care plan section)  Progress towards OT goals: Progressing toward goals  Acute Rehab OT Goals Patient Stated Goal: to get better and be able to use my left arm OT Goal Formulation: With patient  Plan Discharge plan remains appropriate;Frequency remains appropriate    Co-evaluation                 AM-PAC OT "6 Clicks" Daily Activity     Outcome Measure   Help from another person eating meals?: A Little Help from another person taking care of personal grooming?: A Little Help from another person toileting, which includes using toliet, bedpan, or urinal?: Total Help from another person bathing (including washing, rinsing, drying)?: A Lot Help from another person to put on and taking off regular upper body clothing?: A Lot Help from another person to put on and taking off regular lower body clothing?: Total 6 Click Score: 12    End of Session Equipment Utilized During Treatment: Rolling  walker  OT Visit Diagnosis: Other abnormalities of gait and mobility (R26.89);Muscle weakness (generalized) (M62.81);Pain;Hemiplegia and hemiparesis Hemiplegia - Right/Left: Left Hemiplegia - dominant/non-dominant: Non-Dominant Hemiplegia - caused by: Cerebral infarction Pain - part of body:  (sternal incisonal)   Activity Tolerance Patient limited by fatigue   Patient Left with call bell/phone within reach;in bed;with bed alarm set   Nurse Communication Mobility status;Precautions;Other (comment) (encouragement of eating and options for family to bring in food)        Time: [id number] OT Time Calculation (min): 28 min  Charges: OT General Charges $OT Visit: 1 Visit OT Treatments $Self Care/Home Management : 23-37 mins  Jolaine Artist, OT [place] Pager [phone] Office [place] 08/08/2021, 1:30 PM

## 2021-08-08 NOTE — Progress Notes (Signed)
Physical Therapy Treatment Patient Details Name: LENELL PAOLELLA MRN: [id number] DOB: 1956-09-18 Today's Date: 08/08/2021    History of Present Illness 65 y.o. female admitted 8/22 for CABG x4 and aortic valve tumor resection. 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    PT Comments    Pt tolerated treatment well with VSS on RA. Pt progressed to ambulation in hall, demonstrating increased distance compared to previous session. Pt demonstrated improved recall of precautions during session. Continue to recommend CIR, as pt's deficits remain.  Pre-vitals - HR 75, spO2 94%, BP 153/64(90) Post-vitals - HR 76, spO2 91, BP 169/73(97)   Follow Up Recommendations  CIR     Equipment Recommendations  None recommended by PT    Recommendations for Other Services       Precautions / Restrictions Precautions Precautions: Fall;Sternal Restrictions Weight Bearing Restrictions: No    Mobility  Bed Mobility Overal bed mobility: Needs Assistance Bed Mobility: Supine to Sit     Supine to sit: Mod assist;+2 for physical assistance     General bed mobility comments: Mod assist to roll and elevate trunk. Multimodal cues provided to bring LEs off EOB.    Transfers Overall transfer level: Needs assistance Equipment used: Rolling walker (2 wheeled) Transfers: Sit to/from [facility] Sit to Stand: Min assist;+2 safety/equipment;Mod assist         General transfer comment: Sit to stand x3 +2 for safety: 1 bed, 1 from [facility], and 1 from recliner. 1st trial min A to power up to standing with L knee block and without RW. 2nd trial also min A to power up with RW and 3rd trial with RW and mod A to power up to stand.  Ambulation/Gait Ambulation/Gait assistance: Min assist;+2 safety/equipment Gait Distance (Feet): 18 Feet Assistive device: Rolling walker (2 wheeled)    Gait velocity: Decreased Gait velocity interpretation: <1.8 ft/sec, indicate of risk for recurrent falls General Gait Details: +2 for chair follow. Ambulated 76' then 81' with RW and min A for keeping hips in and guiding RW. Verbal cues provided for maintainence of upright posture. Pt demonstrated improved gait when saying which foot she is stepping with.   Stairs             Wheelchair Mobility    Modified Rankin (Stroke Patients Only)       Balance Overall balance assessment: Needs assistance Sitting-balance support: Feet supported;No upper extremity supported Sitting balance-Leahy Scale: Fair     Standing balance support: Bilateral upper extremity supported;During functional activity Standing balance-Leahy Scale: Poor Standing balance comment: relies on BUE and external support                            Cognition                                              Exercises General Exercises - Lower Extremity Long Arc Quad: AROM;Both;Seated;20 reps Hip Flexion/Marching: AROM;Both;Seated;15 reps    General Comments General comments (skin integrity, edema, etc.): VSS on RA      Pertinent Vitals/Pain Faces Pain Scale: Hurts a little bit Pain Location: Chest Pain Intervention(s): Monitored during session;Repositioned    Home Living  Prior Function            PT Goals (current goals can now be found in the care plan section) Progress towards PT goals: Progressing toward goals    Frequency    Min 3X/week      PT Plan Current plan remains appropriate    Co-evaluation              AM-PAC PT "6 Clicks" Mobility   Outcome Measure  Help needed turning from your back to your side while in a flat bed without using bedrails?: A Lot Help needed moving from lying on your back to sitting on the side of a flat bed without using bedrails?: A Lot Help needed moving to and from a bed to a chair  (including a wheelchair)?: A Lot Help needed standing up from a chair using your arms (e.g., wheelchair or bedside chair)?: A Lot Help needed to walk in hospital room?: A Lot Help needed climbing 3-5 steps with a railing? : Total 6 Click Score: 11    End of Session Equipment Utilized During Treatment: Gait belt Activity Tolerance: Patient tolerated treatment well Patient left: in chair;with call bell/phone within reach;with chair alarm set;with nursing/sitter in room Nurse Communication: Mobility status;Precautions PT Visit Diagnosis: Unsteadiness on feet (R26.81);Other abnormalities of gait and mobility (R26.89);Muscle weakness (generalized) (M62.81)     Time: [id number] PT Time Calculation (min) (ACUTE ONLY): 40 min  Charges:  $Gait Training: 23-37 mins $Therapeutic Activity: 8-22 mins                     Louie Casa, SPT Acute Rehab: [phone] [id number]    Domingo Dimes 08/08/2021, 10:05 AM

## 2021-08-08 NOTE — Progress Notes (Addendum)
      [address]       [place] [address]             [phone]      4 Days Post-Op Procedure(s) (LRB): RESECTION AORTIC VALVE TUMOR (N/A) CORONARY ARTERY BYPASS GRAFTING (CABG) X  4  USING LEFT INTERNAL MAMMARY ARTERY AND RIGHT GREATER SAPHENOUS VEIN ENDOSCOPIC CONDUITS (N/A) TRANSESOPHAGEAL ECHOCARDIOGRAM (TEE) (N/A) APPLICATION OF CELL SAVER  Subjective: Patient sleepy this morning, denied pain.  + BM  Objective: Vital signs in last 24 hours: Temp:  [98.1 F (36.7 C)-98.9 F (37.2 C)] 98.3 F (36.8 C) (08/26 0342) Pulse Rate:  [60-78] 73 (08/26 0342) Cardiac Rhythm: Normal sinus rhythm (08/26 0726) Resp:  [12-24] 15 (08/26 0342) BP: (121-166)/(54-105) 154/64 (08/26 0342) SpO2:  [92 %-100 %] 94 % (08/26 0342) Weight:  [91.6 kg] 91.6 kg (08/26 0542)  Intake/Output from previous day: 08/25 0701 - 08/26 0700 In: 603 [P.O.:600; I.V.:3] Out: 1300 [Urine:1300]  General appearance: alert, cooperative, and no distress Heart: regular rate and rhythm Lungs: clear to auscultation bilaterally Abdomen: soft, non-tender; bowel sounds normal; no masses,  no organomegaly Extremities: edema trace Wound: clean and dry, ecchymosis RLE  Lab Results: Recent Labs    08/07/21 0036 08/08/21 0118  WBC 8.6 9.2  HGB 7.9* 8.6*  HCT 24.5* 25.6*  PLT 156 193   BMET:  Recent Labs    08/07/21 0036 08/08/21 0118  NA 136 136  K 4.0 4.2  CL 102 101  CO2 26 27  GLUCOSE 73 132*  BUN 14 15  CREATININE 0.88 0.76  CALCIUM 8.3* 8.4*    PT/INR: No results for input(s): LABPROT, INR in the last 72 hours. ABG    Component Value Date/Time   PHART 7.346 (L) 08/05/2021 0105   HCO3 26.2 08/05/2021 0105   TCO2 28 08/05/2021 0105   ACIDBASEDEF 1.0 08/04/2021 2140   O2SAT 98.0 08/05/2021 0105   CBG (last 3)  Recent Labs    08/07/21 1624 08/07/21 2121 08/08/21 0643  GLUCAP 103* 115* 109*    Assessment/Plan: S/P Procedure(s) (LRB): RESECTION AORTIC VALVE TUMOR  (N/A) CORONARY ARTERY BYPASS GRAFTING (CABG) X  4  USING LEFT INTERNAL MAMMARY ARTERY AND RIGHT GREATER SAPHENOUS VEIN ENDOSCOPIC CONDUITS (N/A) TRANSESOPHAGEAL ECHOCARDIOGRAM (TEE) (N/A) APPLICATION OF CELL SAVER  CV- NSR, remains hypertensive- continue Lopressor, Hydralazine, Norvasc, will resume home dose of Cozaar... d/c EPW today Pulm- wean oxygen as tolerated, continue IS Renal- creatinine is normal 0.76, K is at 4.2, continue Lasix, potassium Neuro- recent stroke, remains stable, sleepy this morning Expected post operative blood loss anemia, Hgb at 8.6 DM-sugars controlled, monitor closely Deconditioning- CIR to start insurance authorization, for d/c early next week   LOS: 4 days    Ellwood Handler, PA-C 08/08/2021  Patient seen and examined, agree with above Progressing slowly as anticipated Should be ready for CIR soon  Remo Lipps C. Roxan Hockey, MD [facility] [phone]

## 2021-08-08 NOTE — Progress Notes (Signed)
At 1504, a complete set of vitals was taken. Gauze placed over pacing wires was carefully removed, exposing two sets of wires, one of the right and one on the left, mid abdominal area.  One stitch was in place at each site. One stitch was removed at each site. Patient instructed to take deep breath and pacer wires were pulled down and out at the same time. Wires were intact. No bleeding or oozing. Gauze was placed over each site. Patient tolerated procedure well.  Cont to monitor.

## 2021-08-08 NOTE — Progress Notes (Signed)
Inpatient Rehab Admissions Coordinator:   I did receive insurance authorization for CIR. Will follow for medical readiness.   Shann Medal, PT, DPT Admissions Coordinator [phone] 08/08/21  12:07 PM

## 2021-08-09 LAB — GLUCOSE, CAPILLARY
Glucose-Capillary: 116 mg/dL — ABNORMAL HIGH (ref 70–99)
Glucose-Capillary: 153 mg/dL — ABNORMAL HIGH (ref 70–99)
Glucose-Capillary: 134 mg/dL — ABNORMAL HIGH (ref 70–99)
Glucose-Capillary: 154 mg/dL — ABNORMAL HIGH (ref 70–99)

## 2021-08-09 MED ORDER — LOSARTAN POTASSIUM 50 MG PO TABS
100.0000 mg | ORAL_TABLET | Freq: Every day | ORAL | Status: DC
  Administered 2021-08-10 – 2021-08-11 (×2): 100 mg via ORAL
  Filled 2021-08-09 (×2): qty 2

## 2021-08-09 MED ORDER — MAGNESIUM OXIDE -MG SUPPLEMENT 400 (240 MG) MG PO TABS
400.0000 mg | ORAL_TABLET | Freq: Two times a day (BID) | ORAL | Status: DC
  Administered 2021-08-09 – 2021-08-11 (×5): 400 mg via ORAL
  Filled 2021-08-09 (×5): qty 1

## 2021-08-09 NOTE — Progress Notes (Addendum)
[address]       [place] [address]             [phone]      5 Days Post-Op Procedure(s) (LRB): RESECTION AORTIC VALVE TUMOR (N/A) CORONARY ARTERY BYPASS GRAFTING (CABG) X  4  USING LEFT INTERNAL MAMMARY ARTERY AND RIGHT GREATER SAPHENOUS VEIN ENDOSCOPIC CONDUITS (N/A) TRANSESOPHAGEAL ECHOCARDIOGRAM (TEE) (N/A) APPLICATION OF CELL SAVER Subjective: Slowly feeling better.stronger, poor appetite  Objective: Vital signs in last 24 hours: Temp:  [97.5 F (36.4 C)-98.3 F (36.8 C)] 97.5 F (36.4 C) (08/27 0407) Pulse Rate:  [69-74] 74 (08/27 0807) Cardiac Rhythm: Normal sinus rhythm (08/27 0700) Resp:  [14-19] 14 (08/27 0407) BP: (132-161)/(58-75) 141/58 (08/27 0807) SpO2:  [90 %-95 %] 90 % (08/27 0807) Weight:  [90.9 kg] 90.9 kg (08/27 0527)  Hemodynamic parameters for last 24 hours:    Intake/Output from previous day: 08/26 0701 - 08/27 0700 In: 60 [P.O.:50; I.V.:10] Out: -  Intake/Output this shift: No intake/output data recorded.  General appearance: alert, cooperative, and no distress Heart: regular rate and rhythm Lungs: dim left base Abdomen: benign Extremities: no edema Wound: incis healing well  Lab Results: Recent Labs    08/07/21 0036 08/08/21 0118  WBC 8.6 9.2  HGB 7.9* 8.6*  HCT 24.5* 25.6*  PLT 156 193   BMET:  Recent Labs    08/07/21 0036 08/08/21 0118  NA 136 136  K 4.0 4.2  CL 102 101  CO2 26 27  GLUCOSE 73 132*  BUN 14 15  CREATININE 0.88 0.76  CALCIUM 8.3* 8.4*    PT/INR: No results for input(s): LABPROT, INR in the last 72 hours. ABG    Component Value Date/Time   PHART 7.346 (L) 08/05/2021 0105   HCO3 26.2 08/05/2021 0105   TCO2 28 08/05/2021 0105   ACIDBASEDEF 1.0 08/04/2021 2140   O2SAT 98.0 08/05/2021 0105   CBG (last 3)  Recent Labs    08/08/21 1643 08/08/21 2053 08/09/21 0604  GLUCAP 130* 102* 116*    Meds Scheduled Meds:  acetaminophen  1,000 mg Oral Q6H   Or   acetaminophen  (TYLENOL) oral liquid 160 mg/5 mL  1,000 mg Per Tube Q6H   amLODipine  10 mg Oral QHS   aspirin EC  325 mg Oral Daily   Or   aspirin  324 mg Per Tube Daily   atorvastatin  40 mg Oral Daily   bisacodyl  10 mg Oral Daily   Or   bisacodyl  10 mg Rectal Daily   docusate sodium  200 mg Oral Daily   enoxaparin (LOVENOX) injection  40 mg Subcutaneous QHS   furosemide  40 mg Oral Daily   gabapentin  300 mg Oral QHS   hydrALAZINE  10 mg Oral QHS   insulin aspart  0-15 Units Subcutaneous TID WC   losartan  50 mg Oral Daily   metoprolol tartrate  12.5 mg Oral BID   Or   metoprolol tartrate  12.5 mg Per Tube BID   pantoprazole  40 mg Oral QHS   potassium chloride  20 mEq Oral Daily   rOPINIRole  0.5 mg Oral QHS   sertraline  100 mg Oral Daily   sodium chloride flush  3 mL Intravenous Q12H   Continuous Infusions:  sodium chloride     PRN Meds:.sodium chloride, ALPRAZolam, magnesium hydroxide, methocarbamol, metoprolol tartrate, ondansetron (ZOFRAN) IV, sodium chloride flush, traMADol  Xrays No results found.  Assessment/Plan: S/P Procedure(s) (LRB): RESECTION AORTIC VALVE TUMOR (N/A) CORONARY ARTERY BYPASS GRAFTING (CABG) X  4  USING LEFT INTERNAL MAMMARY ARTERY AND RIGHT GREATER SAPHENOUS VEIN ENDOSCOPIC CONDUITS (N/A) TRANSESOPHAGEAL ECHOCARDIOGRAM (TEE) (N/A) APPLICATION OF CELL SAVER  1 afeb, VSS sBP 130's-160's, on mult HTN meds, sinus rhythm with PAC's- will uptitrate SRB 2 sats ok on RA 3 appears to have adequate  UOP, weight conts to trend towards baseline- on lasix, totals not recorded 4 no new labs, BS adeq control, will eventually need to transition to po meds- she may not need as much since she has lost 30#since stroke, has poor appetite currently 5 accepted for CIR when medicaly ready, prob early next week 6 repeat labs in am, last MG++ low, will add supplement    LOS: 5 days    John Giovanni PA-C Pager [phone] 08/09/2021    Chart reviewed, patient examined,  agree with above. She continues to feels better and recovering from her preop stroke. She should be ready for CIR early next week.

## 2021-08-10 LAB — BASIC METABOLIC PANEL
Anion gap: 15 (ref 5–15)
BUN: 9 mg/dL (ref 8–23)
CO2: 31 mmol/L (ref 22–32)
Calcium: 8.8 mg/dL — ABNORMAL LOW (ref 8.9–10.3)
Chloride: 89 mmol/L — ABNORMAL LOW (ref 98–111)
Creatinine, Ser: 0.72 mg/dL (ref 0.44–1.00)
GFR, Estimated: >60 mL/min (ref >60)
Glucose, Bld: 156 mg/dL — ABNORMAL HIGH (ref 70–99)
Potassium: 3.7 mmol/L (ref 3.5–5.1)
Sodium: 135 mmol/L (ref 135–145)

## 2021-08-10 LAB — CBC
HCT: 31.6 % — ABNORMAL LOW (ref 36.0–46.0)
Hemoglobin: 10.4 g/dL — ABNORMAL LOW (ref 12.0–15.0)
MCH: 27.9 pg (ref 26.0–34.0)
MCHC: 32.9 g/dL (ref 30.0–36.0)
MCV: 84.7 fL (ref 80.0–100.0)
Platelets: 322 10*3/uL (ref 150–400)
RBC: 3.73 MIL/uL — ABNORMAL LOW (ref 3.87–5.11)
RDW: 12.8 % (ref 11.5–15.5)
WBC: 10.7 10*3/uL — ABNORMAL HIGH (ref 4.0–10.5)
nRBC: 0 % (ref 0.0–0.2)

## 2021-08-10 LAB — GLUCOSE, CAPILLARY
Glucose-Capillary: 150 mg/dL — ABNORMAL HIGH (ref 70–99)
Glucose-Capillary: 125 mg/dL — ABNORMAL HIGH (ref 70–99)
Glucose-Capillary: 134 mg/dL — ABNORMAL HIGH (ref 70–99)
Glucose-Capillary: 152 mg/dL — ABNORMAL HIGH (ref 70–99)
Glucose-Capillary: 162 mg/dL — ABNORMAL HIGH (ref 70–99)

## 2021-08-10 LAB — MAGNESIUM: Magnesium: 1.3 mg/dL — ABNORMAL LOW (ref 1.7–2.4)

## 2021-08-10 NOTE — Progress Notes (Signed)
Physical Therapy Treatment Patient Details Name: [name] MRN: [id number] DOB: Mar 01, 1956 Today's Date: 08/10/2021    History of Present Illness 65 y.o. female admitted 8/22 for CABG x4 and aortic valve tumor resection. 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    PT Comments    Pt limited by reports of nausea this session. Pt is able to ambulate and continues to require physical assistance to manage all transfers due to LE power deficits. Pt also continues to require verbal cues to maintain sternal precautions, often reaching laterally into shoulder horizontal abduction. Pt will continue to benefit from aggressive mobilization to improve mobility quality and reduce falls risk. PT continues to recommend CIR placement.   Follow Up Recommendations  CIR     Equipment Recommendations  None recommended by PT    Recommendations for Other Services       Precautions / Restrictions Precautions Precautions: Fall;Sternal Precaution Booklet Issued: Yes (comment) Precaution Comments: pt requires cues to avoid shoulder horizontal abduction Restrictions Weight Bearing Restrictions: No Other Position/Activity Restrictions: sternal precautions    Mobility  Bed Mobility Overal bed mobility: Needs Assistance Bed Mobility: Rolling;Sidelying to Sit;Sit to Supine Rolling: Min guard Sidelying to sit: Mod assist   Sit to supine: Min assist   General bed mobility comments: pt requires assistance to elevate trunk into sitting to maintain sternal precautions. Pt able to roll but unable to maintain rolled position, falls back    Transfers Overall transfer level: Needs assistance Equipment used: Rolling walker (2 wheeled) Transfers: Sit to/from Stand Sit to Stand: Mod assist         General transfer comment: assist to power into standing due to LE  weakness  Ambulation/Gait Ambulation/Gait assistance: Min assist Gait Distance (Feet): 15 Feet Assistive device: Rolling walker (2 wheeled) Gait Pattern/deviations: Step-to pattern Gait velocity: reduced Gait velocity interpretation: <1.8 ft/sec, indicate of risk for recurrent falls General Gait Details: pt with slowed step-to gait, increased time for swing phase of LLE   Stairs             Wheelchair Mobility    Modified Rankin (Stroke Patients Only)       Balance Overall balance assessment: Needs assistance Sitting-balance support: No upper extremity supported;Feet supported Sitting balance-Leahy Scale: Fair     Standing balance support: Bilateral upper extremity supported Standing balance-Leahy Scale: Poor Standing balance comment: relies on BUE and external support                            Cognition Arousal/Alertness: Awake/alert Behavior During Therapy: WFL for tasks assessed/performed Overall Cognitive Status: Impaired/Different from baseline Area of Impairment: Memory                     Memory: Decreased recall of precautions                Exercises      General Comments General comments (skin integrity, edema, etc.): VSS on RA, pt reports nausea during ambulation, limiting activity tolerance      Pertinent Vitals/Pain Pain Assessment: No/denies pain    Home Living                      Prior Function            PT Goals (current goals can now be found in the  care plan section) Acute Rehab PT Goals Patient Stated Goal: to get better and be able to use my left arm Progress towards PT goals: Progressing toward goals    Frequency    Min 3X/week      PT Plan Current plan remains appropriate    Co-evaluation              AM-PAC PT "6 Clicks" Mobility   Outcome Measure  Help needed turning from your back to your side while in a flat bed without using bedrails?: A Little Help needed moving  from lying on your back to sitting on the side of a flat bed without using bedrails?: A Lot Help needed moving to and from a bed to a chair (including a wheelchair)?: A Lot Help needed standing up from a chair using your arms (e.g., wheelchair or bedside chair)?: A Lot Help needed to walk in hospital room?: A Little Help needed climbing 3-5 steps with a railing? : Total 6 Click Score: 13    End of Session   Activity Tolerance: Treatment limited secondary to medical complications (Comment) (nausea) Patient left: in bed;with bed alarm set;with call bell/phone within reach Nurse Communication: Mobility status;Precautions PT Visit Diagnosis: Unsteadiness on feet (R26.81);Other abnormalities of gait and mobility (R26.89);Muscle weakness (generalized) (M62.81)     Time: [id number] PT Time Calculation (min) (ACUTE ONLY): 21 min  Charges:  $Therapeutic Activity: 8-22 mins                     Zenaida Niece, PT, DPT Acute Rehabilitation Pager: [phone]    Zenaida Niece 08/10/2021, 4:19 PM

## 2021-08-10 NOTE — Progress Notes (Signed)
6 Days Post-Op Procedure(s) (LRB): RESECTION AORTIC VALVE TUMOR (N/A) CORONARY ARTERY BYPASS GRAFTING (CABG) X  4  USING LEFT INTERNAL MAMMARY ARTERY AND RIGHT GREATER SAPHENOUS VEIN ENDOSCOPIC CONDUITS (N/A) TRANSESOPHAGEAL ECHOCARDIOGRAM (TEE) (N/A) APPLICATION OF CELL SAVER Subjective: Feels well. Says she is ready to get to rehab.  Objective: Vital signs in last 24 hours: Temp:  [98 F (36.7 C)-98.4 F (36.9 C)] 98.4 F (36.9 C) (08/28 0737) Pulse Rate:  [72-80] 80 (08/28 0737) Cardiac Rhythm: Normal sinus rhythm;Heart block (08/28 0700) Resp:  [13-19] 15 (08/28 0737) BP: (122-155)/(67-81) 145/80 (08/28 0737) SpO2:  [90 %-94 %] 92 % (08/28 0737) Weight:  [86.4 kg] 86.4 kg (08/28 0318)  Hemodynamic parameters for last 24 hours:    Intake/Output from previous day: 08/27 0701 - 08/28 0700 In: 830 [P.O.:820; I.V.:10] Out: 1200 [Urine:1200] Intake/Output this shift: No intake/output data recorded.  General appearance: alert and cooperative Neurologic: left upper extremity weakness Heart: regular rate and rhythm Lungs: clear to auscultation bilaterally Extremities: no edema Wound: incisions healing well.  Lab Results: Recent Labs    08/08/21 0118 08/10/21 0035  WBC 9.2 10.7*  HGB 8.6* 10.4*  HCT 25.6* 31.6*  PLT 193 322   BMET:  Recent Labs    08/08/21 0118 08/10/21 0035  NA 136 135  K 4.2 3.7  CL 101 89*  CO2 27 31  GLUCOSE 132* 156*  BUN 15 9  CREATININE 0.76 0.72  CALCIUM 8.4* 8.8*    PT/INR: No results for input(s): LABPROT, INR in the last 72 hours. ABG    Component Value Date/Time   PHART 7.346 (L) 08/05/2021 0105   HCO3 26.2 08/05/2021 0105   TCO2 28 08/05/2021 0105   ACIDBASEDEF 1.0 08/04/2021 2140   O2SAT 98.0 08/05/2021 0105   CBG (last 3)  Recent Labs    08/09/21 2103 08/10/21 0607 08/10/21 0725  GLUCAP 154* 150* 125*    Assessment/Plan: S/P Procedure(s) (LRB): RESECTION AORTIC VALVE TUMOR (N/A) CORONARY ARTERY BYPASS  GRAFTING (CABG) X  4  USING LEFT INTERNAL MAMMARY ARTERY AND RIGHT GREATER SAPHENOUS VEIN ENDOSCOPIC CONDUITS (N/A) TRANSESOPHAGEAL ECHOCARDIOGRAM (TEE) (N/A) APPLICATION OF CELL SAVER  POD 6 Hemodynamically stable in sinus rhythm. Weight is below preop. Labs look fine. I think she can go to CIR when bed available.   LOS: 6 days    [name] 08/10/2021

## 2021-08-11 ENCOUNTER — Encounter (HOSPITAL_COMMUNITY): Payer: Self-pay | Admitting: Physical Medicine and Rehabilitation

## 2021-08-11 ENCOUNTER — Other Ambulatory Visit: Payer: Self-pay

## 2021-08-11 ENCOUNTER — Inpatient Hospital Stay (HOSPITAL_COMMUNITY)
Admission: RE | Admit: 2021-08-11 | Discharge: 2021-09-12 | DRG: 940 | Disposition: A | Discharge status: Home Health | Payer: 59 | Source: Intra-hospital | Attending: Physical Medicine and Rehabilitation | Admitting: Physical Medicine and Rehabilitation

## 2021-08-11 DIAGNOSIS — I2510 Atherosclerotic heart disease of native coronary artery without angina pectoris: Secondary | ICD-10-CM | POA: Diagnosis not present

## 2021-08-11 DIAGNOSIS — M47817 Spondylosis without myelopathy or radiculopathy, lumbosacral region: Secondary | ICD-10-CM | POA: Diagnosis not present

## 2021-08-11 DIAGNOSIS — K219 Gastro-esophageal reflux disease without esophagitis: Secondary | ICD-10-CM | POA: Diagnosis not present

## 2021-08-11 DIAGNOSIS — B9561 Methicillin susceptible Staphylococcus aureus infection as the cause of diseases classified elsewhere: Secondary | ICD-10-CM | POA: Diagnosis not present

## 2021-08-11 DIAGNOSIS — I517 Cardiomegaly: Secondary | ICD-10-CM | POA: Diagnosis not present

## 2021-08-11 DIAGNOSIS — E1142 Type 2 diabetes mellitus with diabetic polyneuropathy: Secondary | ICD-10-CM | POA: Diagnosis not present

## 2021-08-11 DIAGNOSIS — E669 Obesity, unspecified: Secondary | ICD-10-CM | POA: Diagnosis present

## 2021-08-11 DIAGNOSIS — E871 Hypo-osmolality and hyponatremia: Secondary | ICD-10-CM | POA: Diagnosis not present

## 2021-08-11 DIAGNOSIS — F32A Depression, unspecified: Secondary | ICD-10-CM | POA: Diagnosis present

## 2021-08-11 DIAGNOSIS — F419 Anxiety disorder, unspecified: Secondary | ICD-10-CM | POA: Diagnosis present

## 2021-08-11 DIAGNOSIS — D62 Acute posthemorrhagic anemia: Secondary | ICD-10-CM

## 2021-08-11 DIAGNOSIS — Z794 Long term (current) use of insulin: Secondary | ICD-10-CM

## 2021-08-11 DIAGNOSIS — R5381 Other malaise: Principal | ICD-10-CM | POA: Diagnosis present

## 2021-08-11 DIAGNOSIS — Z20822 Contact with and (suspected) exposure to covid-19: Secondary | ICD-10-CM | POA: Diagnosis not present

## 2021-08-11 DIAGNOSIS — Z87891 Personal history of nicotine dependence: Secondary | ICD-10-CM

## 2021-08-11 DIAGNOSIS — M79642 Pain in left hand: Secondary | ICD-10-CM | POA: Diagnosis present

## 2021-08-11 DIAGNOSIS — Z820 Family history of epilepsy and other diseases of the nervous system: Secondary | ICD-10-CM

## 2021-08-11 DIAGNOSIS — N179 Acute kidney failure, unspecified: Secondary | ICD-10-CM | POA: Diagnosis not present

## 2021-08-11 DIAGNOSIS — Z6830 Body mass index (BMI) 30.0-30.9, adult: Secondary | ICD-10-CM

## 2021-08-11 DIAGNOSIS — K5901 Slow transit constipation: Secondary | ICD-10-CM | POA: Diagnosis not present

## 2021-08-11 DIAGNOSIS — R509 Fever, unspecified: Secondary | ICD-10-CM

## 2021-08-11 DIAGNOSIS — J302 Other seasonal allergic rhinitis: Secondary | ICD-10-CM | POA: Diagnosis not present

## 2021-08-11 DIAGNOSIS — I700 Atherosclerosis of aorta: Secondary | ICD-10-CM | POA: Diagnosis not present

## 2021-08-11 DIAGNOSIS — Z7982 Long term (current) use of aspirin: Secondary | ICD-10-CM

## 2021-08-11 DIAGNOSIS — K5730 Diverticulosis of large intestine without perforation or abscess without bleeding: Secondary | ICD-10-CM | POA: Diagnosis not present

## 2021-08-11 DIAGNOSIS — Y832 Surgical operation with anastomosis, bypass or graft as the cause of abnormal reaction of the patient, or of later complication, without mention of misadventure at the time of the procedure: Secondary | ICD-10-CM | POA: Diagnosis not present

## 2021-08-11 DIAGNOSIS — E7800 Pure hypercholesterolemia, unspecified: Secondary | ICD-10-CM | POA: Diagnosis not present

## 2021-08-11 DIAGNOSIS — I313 Pericardial effusion (noninflammatory): Secondary | ICD-10-CM | POA: Diagnosis not present

## 2021-08-11 DIAGNOSIS — G2581 Restless legs syndrome: Secondary | ICD-10-CM | POA: Diagnosis present

## 2021-08-11 DIAGNOSIS — Z96641 Presence of right artificial hip joint: Secondary | ICD-10-CM | POA: Diagnosis present

## 2021-08-11 DIAGNOSIS — I959 Hypotension, unspecified: Secondary | ICD-10-CM | POA: Diagnosis not present

## 2021-08-11 DIAGNOSIS — Z833 Family history of diabetes mellitus: Secondary | ICD-10-CM

## 2021-08-11 DIAGNOSIS — T8141XA Infection following a procedure, superficial incisional surgical site, initial encounter: Secondary | ICD-10-CM | POA: Diagnosis not present

## 2021-08-11 DIAGNOSIS — I10 Essential (primary) hypertension: Secondary | ICD-10-CM | POA: Diagnosis present

## 2021-08-11 DIAGNOSIS — M21332 Wrist drop, left wrist: Secondary | ICD-10-CM | POA: Diagnosis present

## 2021-08-11 DIAGNOSIS — R4182 Altered mental status, unspecified: Secondary | ICD-10-CM | POA: Diagnosis not present

## 2021-08-11 DIAGNOSIS — R079 Chest pain, unspecified: Secondary | ICD-10-CM | POA: Diagnosis not present

## 2021-08-11 DIAGNOSIS — Z79899 Other long term (current) drug therapy: Secondary | ICD-10-CM | POA: Diagnosis not present

## 2021-08-11 DIAGNOSIS — R2241 Localized swelling, mass and lump, right lower limb: Secondary | ICD-10-CM | POA: Diagnosis present

## 2021-08-11 DIAGNOSIS — I69354 Hemiplegia and hemiparesis following cerebral infarction affecting left non-dominant side: Secondary | ICD-10-CM

## 2021-08-11 DIAGNOSIS — Z8249 Family history of ischemic heart disease and other diseases of the circulatory system: Secondary | ICD-10-CM

## 2021-08-11 DIAGNOSIS — I9789 Other postprocedural complications and disorders of the circulatory system, not elsewhere classified: Secondary | ICD-10-CM | POA: Diagnosis not present

## 2021-08-11 DIAGNOSIS — R229 Localized swelling, mass and lump, unspecified: Secondary | ICD-10-CM

## 2021-08-11 DIAGNOSIS — R682 Dry mouth, unspecified: Secondary | ICD-10-CM | POA: Diagnosis not present

## 2021-08-11 DIAGNOSIS — Z7984 Long term (current) use of oral hypoglycemic drugs: Secondary | ICD-10-CM

## 2021-08-11 DIAGNOSIS — T8149XA Infection following a procedure, other surgical site, initial encounter: Secondary | ICD-10-CM | POA: Diagnosis not present

## 2021-08-11 DIAGNOSIS — F418 Other specified anxiety disorders: Secondary | ICD-10-CM | POA: Diagnosis not present

## 2021-08-11 DIAGNOSIS — E1165 Type 2 diabetes mellitus with hyperglycemia: Secondary | ICD-10-CM | POA: Diagnosis not present

## 2021-08-11 DIAGNOSIS — Z951 Presence of aortocoronary bypass graft: Secondary | ICD-10-CM

## 2021-08-11 DIAGNOSIS — E785 Hyperlipidemia, unspecified: Secondary | ICD-10-CM | POA: Diagnosis present

## 2021-08-11 DIAGNOSIS — E559 Vitamin D deficiency, unspecified: Secondary | ICD-10-CM | POA: Diagnosis present

## 2021-08-11 DIAGNOSIS — I639 Cerebral infarction, unspecified: Secondary | ICD-10-CM | POA: Diagnosis not present

## 2021-08-11 DIAGNOSIS — R339 Retention of urine, unspecified: Secondary | ICD-10-CM | POA: Diagnosis present

## 2021-08-11 DIAGNOSIS — Z800 Family history of malignant neoplasm of digestive organs: Secondary | ICD-10-CM

## 2021-08-11 DIAGNOSIS — R269 Unspecified abnormalities of gait and mobility: Secondary | ICD-10-CM | POA: Diagnosis present

## 2021-08-11 DIAGNOSIS — E875 Hyperkalemia: Secondary | ICD-10-CM | POA: Diagnosis not present

## 2021-08-11 LAB — CBC
HCT: 33 % — ABNORMAL LOW (ref 36.0–46.0)
Hemoglobin: 10.8 g/dL — ABNORMAL LOW (ref 12.0–15.0)
MCH: 28.2 pg (ref 26.0–34.0)
MCHC: 32.7 g/dL (ref 30.0–36.0)
MCV: 86.2 fL (ref 80.0–100.0)
Platelets: 412 10*3/uL — ABNORMAL HIGH (ref 150–400)
RBC: 3.83 MIL/uL — ABNORMAL LOW (ref 3.87–5.11)
RDW: 13.1 % (ref 11.5–15.5)
WBC: 11.2 10*3/uL — ABNORMAL HIGH (ref 4.0–10.5)
nRBC: 0 % (ref 0.0–0.2)

## 2021-08-11 LAB — GLUCOSE, CAPILLARY
Glucose-Capillary: 136 mg/dL — ABNORMAL HIGH (ref 70–99)
Glucose-Capillary: 147 mg/dL — ABNORMAL HIGH (ref 70–99)
Glucose-Capillary: 177 mg/dL — ABNORMAL HIGH (ref 70–99)
Glucose-Capillary: 128 mg/dL — ABNORMAL HIGH (ref 70–99)

## 2021-08-11 LAB — CREATININE, SERUM
Creatinine, Ser: 0.95 mg/dL (ref 0.44–1.00)
GFR, Estimated: >60 mL/min (ref >60)

## 2021-08-11 MED ORDER — HYDRALAZINE HCL 10 MG PO TABS
10.0000 mg | ORAL_TABLET | Freq: Every day | ORAL | Status: DC
  Administered 2021-08-12 – 2021-08-22 (×11): 10 mg via ORAL
  Filled 2021-08-11 (×12): qty 1

## 2021-08-11 MED ORDER — BISACODYL 5 MG PO TBEC
10.0000 mg | DELAYED_RELEASE_TABLET | Freq: Every day | ORAL | Status: DC
  Administered 2021-08-12 – 2021-09-11 (×30): 10 mg via ORAL
  Filled 2021-08-11 (×32): qty 2

## 2021-08-11 MED ORDER — PANTOPRAZOLE SODIUM 40 MG PO TBEC
40.0000 mg | DELAYED_RELEASE_TABLET | Freq: Every day | ORAL | Status: DC
  Administered 2021-08-11 – 2021-09-11 (×32): 40 mg via ORAL
  Filled 2021-08-11 (×31): qty 1

## 2021-08-11 MED ORDER — ENOXAPARIN SODIUM 40 MG/0.4ML IJ SOSY
40.0000 mg | PREFILLED_SYRINGE | Freq: Every day | INTRAMUSCULAR | Status: DC
  Administered 2021-08-11 – 2021-09-11 (×31): 40 mg via SUBCUTANEOUS
  Filled 2021-08-11 (×32): qty 0.4

## 2021-08-11 MED ORDER — ASPIRIN 325 MG PO TBEC: 325.0000 mg | DELAYED_RELEASE_TABLET | Freq: Every day | ORAL | 0 refills | Status: DC

## 2021-08-11 MED ORDER — ASPIRIN EC 325 MG PO TBEC
325.0000 mg | DELAYED_RELEASE_TABLET | Freq: Every day | ORAL | Status: DC
  Administered 2021-08-13 – 2021-09-12 (×31): 325 mg via ORAL
  Filled 2021-08-11 (×32): qty 1

## 2021-08-11 MED ORDER — BISACODYL 10 MG RE SUPP
10.0000 mg | Freq: Every day | RECTAL | Status: DC
  Filled 2021-08-11 (×12): qty 1

## 2021-08-11 MED ORDER — MAGNESIUM OXIDE -MG SUPPLEMENT 400 (240 MG) MG PO TABS
400.0000 mg | ORAL_TABLET | Freq: Two times a day (BID) | ORAL | Status: DC
  Administered 2021-08-11 – 2021-09-12 (×64): 400 mg via ORAL
  Filled 2021-08-11 (×64): qty 1

## 2021-08-11 MED ORDER — FUROSEMIDE 40 MG PO TABS: 40.0000 mg | ORAL_TABLET | Freq: Every day | ORAL | 0 refills | Status: DC

## 2021-08-11 MED ORDER — ALPRAZOLAM 0.5 MG PO TABS: 0.5000 mg | ORAL_TABLET | Freq: Two times a day (BID) | ORAL | Status: DC | PRN

## 2021-08-11 MED ORDER — POTASSIUM CHLORIDE CRYS ER 20 MEQ PO TBCR
20.0000 meq | EXTENDED_RELEASE_TABLET | Freq: Every day | ORAL | Status: DC
  Administered 2021-08-11: 20 meq via ORAL
  Filled 2021-08-11: qty 1

## 2021-08-11 MED ORDER — ADULT MULTIVITAMIN W/MINERALS CH
1.0000 | ORAL_TABLET | Freq: Every day | ORAL | Status: DC
  Administered 2021-08-12 – 2021-09-12 (×32): 1 via ORAL
  Filled 2021-08-11 (×32): qty 1

## 2021-08-11 MED ORDER — ROPINIROLE HCL 1 MG PO TABS
0.5000 mg | ORAL_TABLET | Freq: Every day | ORAL | Status: DC
  Administered 2021-08-11 – 2021-08-22 (×12): 0.5 mg via ORAL
  Filled 2021-08-11 (×11): qty 1

## 2021-08-11 MED ORDER — METFORMIN HCL 500 MG PO TABS
1000.0000 mg | ORAL_TABLET | Freq: Two times a day (BID) | ORAL | Status: DC
  Administered 2021-08-11 – 2021-08-23 (×22): 1000 mg via ORAL
  Filled 2021-08-11 (×23): qty 2

## 2021-08-11 MED ORDER — METOPROLOL TARTRATE 25 MG/10 ML ORAL SUSPENSION
12.5000 mg | Freq: Two times a day (BID) | ORAL | Status: DC
  Filled 2021-08-11 (×3): qty 5

## 2021-08-11 MED ORDER — METOPROLOL TARTRATE 12.5 MG HALF TABLET
12.5000 mg | ORAL_TABLET | Freq: Two times a day (BID) | ORAL | Status: DC
  Administered 2021-08-11 – 2021-08-23 (×22): 12.5 mg via ORAL
  Filled 2021-08-11 (×24): qty 1

## 2021-08-11 MED ORDER — FUROSEMIDE 40 MG PO TABS
40.0000 mg | ORAL_TABLET | Freq: Every day | ORAL | Status: DC
  Administered 2021-08-11: 40 mg via ORAL
  Filled 2021-08-11: qty 1

## 2021-08-11 MED ORDER — SERTRALINE HCL 100 MG PO TABS
100.0000 mg | ORAL_TABLET | Freq: Every day | ORAL | Status: DC
  Administered 2021-08-12 – 2021-09-12 (×32): 100 mg via ORAL
  Filled 2021-08-11 (×32): qty 1

## 2021-08-11 MED ORDER — TRAMADOL HCL 50 MG PO TABS: 50.0000 mg | ORAL_TABLET | Freq: Four times a day (QID) | ORAL | Status: DC | PRN

## 2021-08-11 MED ORDER — INSULIN ASPART 100 UNIT/ML IJ SOLN
0.0000 [IU] | Freq: Three times a day (TID) | INTRAMUSCULAR | Status: DC
Start: 2021-08-11 — End: 2021-09-12
  Administered 2021-08-11 – 2021-08-12 (×3): 3 [IU] via SUBCUTANEOUS
  Administered 2021-08-13: 2 [IU] via SUBCUTANEOUS
  Administered 2021-08-13: 3 [IU] via SUBCUTANEOUS
  Administered 2021-08-13: 2 [IU] via SUBCUTANEOUS
  Administered 2021-08-16 (×2): 3 [IU] via SUBCUTANEOUS
  Administered 2021-08-17 – 2021-08-19 (×6): 2 [IU] via SUBCUTANEOUS
  Administered 2021-08-19: 3 [IU] via SUBCUTANEOUS
  Administered 2021-08-20 – 2021-08-21 (×5): 2 [IU] via SUBCUTANEOUS
  Administered 2021-08-21 – 2021-08-25 (×7): 3 [IU] via SUBCUTANEOUS
  Administered 2021-08-25: 5 [IU] via SUBCUTANEOUS
  Administered 2021-08-25 – 2021-08-26 (×3): 3 [IU] via SUBCUTANEOUS
  Administered 2021-08-26: 8 [IU] via SUBCUTANEOUS
  Administered 2021-08-27: 3 [IU] via SUBCUTANEOUS
  Administered 2021-08-27: 2 [IU] via SUBCUTANEOUS
  Administered 2021-08-27: 3 [IU] via SUBCUTANEOUS
  Administered 2021-08-28: 2 [IU] via SUBCUTANEOUS
  Administered 2021-08-29: 3 [IU] via SUBCUTANEOUS
  Administered 2021-08-29: 2 [IU] via SUBCUTANEOUS
  Administered 2021-08-29 – 2021-08-30 (×2): 3 [IU] via SUBCUTANEOUS
  Administered 2021-08-30 (×2): 2 [IU] via SUBCUTANEOUS
  Administered 2021-08-31 (×3): 3 [IU] via SUBCUTANEOUS
  Administered 2021-09-01: 2 [IU] via SUBCUTANEOUS
  Administered 2021-09-01 (×2): 3 [IU] via SUBCUTANEOUS
  Administered 2021-09-02 – 2021-09-03 (×5): 2 [IU] via SUBCUTANEOUS
  Administered 2021-09-03: 3 [IU] via SUBCUTANEOUS
  Administered 2021-09-04 (×2): 2 [IU] via SUBCUTANEOUS
  Administered 2021-09-05: 3 [IU] via SUBCUTANEOUS
  Administered 2021-09-06: 2 [IU] via SUBCUTANEOUS
  Administered 2021-09-06: 3 [IU] via SUBCUTANEOUS
  Administered 2021-09-07 (×2): 2 [IU] via SUBCUTANEOUS
  Administered 2021-09-07: 3 [IU] via SUBCUTANEOUS
  Administered 2021-09-08 – 2021-09-10 (×6): 2 [IU] via SUBCUTANEOUS
  Administered 2021-09-10: 3 [IU] via SUBCUTANEOUS
  Administered 2021-09-10 – 2021-09-12 (×5): 2 [IU] via SUBCUTANEOUS

## 2021-08-11 MED ORDER — GLYBURIDE 5 MG PO TABS
5.0000 mg | ORAL_TABLET | Freq: Every day | ORAL | Status: DC
  Administered 2021-08-12 – 2021-08-21 (×10): 5 mg via ORAL
  Filled 2021-08-11 (×13): qty 1

## 2021-08-11 MED ORDER — AMLODIPINE BESYLATE 10 MG PO TABS
10.0000 mg | ORAL_TABLET | Freq: Every day | ORAL | Status: DC
  Administered 2021-08-11: 5 mg via ORAL
  Administered 2021-08-12: 10 mg via ORAL
  Filled 2021-08-11 (×2): qty 1

## 2021-08-11 MED ORDER — FUROSEMIDE 40 MG PO TABS
40.0000 mg | ORAL_TABLET | Freq: Every day | ORAL | Status: DC
  Administered 2021-08-12 – 2021-08-22 (×10): 40 mg via ORAL
  Filled 2021-08-11 (×11): qty 1

## 2021-08-11 MED ORDER — POTASSIUM CHLORIDE CRYS ER 20 MEQ PO TBCR
20.0000 meq | EXTENDED_RELEASE_TABLET | Freq: Every day | ORAL | Status: DC
  Administered 2021-08-12 – 2021-08-22 (×11): 20 meq via ORAL
  Filled 2021-08-11 (×12): qty 1

## 2021-08-11 MED ORDER — DICLOFENAC SODIUM 1 % EX GEL: 2.0000 g | Freq: Four times a day (QID) | CUTANEOUS | 0 refills | Status: DC | PRN

## 2021-08-11 MED ORDER — METOPROLOL TARTRATE 25 MG PO TABS: 12.5000 mg | ORAL_TABLET | Freq: Two times a day (BID) | ORAL | Status: DC

## 2021-08-11 MED ORDER — POTASSIUM CHLORIDE CRYS ER 20 MEQ PO TBCR: 20.0000 meq | EXTENDED_RELEASE_TABLET | Freq: Every day | ORAL | Status: DC

## 2021-08-11 MED ORDER — GABAPENTIN 300 MG PO CAPS
300.0000 mg | ORAL_CAPSULE | Freq: Every day | ORAL | Status: DC
  Administered 2021-08-11 – 2021-08-12 (×2): 300 mg via ORAL
  Filled 2021-08-11 (×2): qty 1

## 2021-08-11 MED ORDER — ATORVASTATIN CALCIUM 40 MG PO TABS
40.0000 mg | ORAL_TABLET | Freq: Every day | ORAL | Status: DC
  Administered 2021-08-12 – 2021-09-12 (×32): 40 mg via ORAL
  Filled 2021-08-11 (×32): qty 1

## 2021-08-11 MED ORDER — LOSARTAN POTASSIUM 50 MG PO TABS
100.0000 mg | ORAL_TABLET | Freq: Every day | ORAL | Status: DC
  Administered 2021-08-12 – 2021-08-22 (×10): 100 mg via ORAL
  Filled 2021-08-11 (×12): qty 2

## 2021-08-11 MED ORDER — METHOCARBAMOL 500 MG PO TABS
500.0000 mg | ORAL_TABLET | Freq: Two times a day (BID) | ORAL | Status: DC | PRN
  Administered 2021-08-16 – 2021-09-08 (×9): 500 mg via ORAL
  Filled 2021-08-11 (×10): qty 1

## 2021-08-11 MED ORDER — MAGNESIUM HYDROXIDE 400 MG/5ML PO SUSP
30.0000 mL | Freq: Every day | ORAL | Status: DC | PRN
  Administered 2021-08-22 – 2021-08-31 (×4): 30 mL via ORAL
  Filled 2021-08-11 (×4): qty 30

## 2021-08-11 MED ORDER — ASPIRIN 81 MG PO CHEW
324.0000 mg | CHEWABLE_TABLET | Freq: Every day | ORAL | Status: DC
  Administered 2021-08-12: 324 mg
  Filled 2021-08-11: qty 4

## 2021-08-11 MED ORDER — TRAMADOL HCL 50 MG PO TABS
50.0000 mg | ORAL_TABLET | Freq: Four times a day (QID) | ORAL | Status: DC | PRN
  Administered 2021-08-12 – 2021-09-07 (×18): 50 mg via ORAL
  Filled 2021-08-11 (×19): qty 1

## 2021-08-11 MED ORDER — DULOXETINE HCL 20 MG PO CPEP
20.0000 mg | ORAL_CAPSULE | Freq: Every day | ORAL | Status: DC
  Filled 2021-08-11: qty 1

## 2021-08-11 NOTE — H&P (Signed)
Physical Medicine and Rehabilitation Admission H&P     HPI: [name] is a 65 year old right-handed female with history of diabetes mellitus hypertension hyperlipidemia remote tobacco use obesity with BMI 30.83, anxiety/depression as well as right thalamic infarction receiving CIR 04/29/2021 to 05/27/2021.  She was discharged home ambulating 200 feet contact-guard assist.  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  Per chart review patient lives with her daughter.  1 level home 3 steps to entry.  Independent with assistive device driving short distances.  Presented 08/04/2021 with noted history findings of papillary fibroelastoma identified during work-up of CVA and plan was for patient to complete inpatient rehab services of which she did.  She had a coronary CT which showed significant stenosis in all the coronaries with definite impairment of flow in the LAD and diagonal distribution and due to these findings patient underwent median sternotomy resection of fibroelastoma from aortic valve/CABG x4 08/04/2021 per Dr. Roxan Hockey.  Sternal precautions as indicated.  Hospital course acute blood loss anemia 7.7 transfused latest hemoglobin 10.4.  She currently remains on aspirin for history of CVA as well as CABG.  Subcutaneous Lovenox initiated for DVT prophylaxis.  Tolerating a regular diet.  Therapy evaluations completed due to patient decreased functional mobility was admitted for a comprehensive rehab program. She complains of severe left hand pain.   Review of Systems  Constitutional:  Negative for chills and fever.  HENT:  Negative for hearing loss.   Eyes:  Negative for blurred vision and double vision.  Respiratory:  Negative for cough.        Occasional shortness of breath on exertion  Cardiovascular:  Negative for chest pain and palpitations.  Gastrointestinal:  Positive for constipation. Negative for diarrhea,  heartburn and nausea.       GERD  Genitourinary:  Negative for dysuria, flank pain and hematuria.  Musculoskeletal:  Positive for joint pain and myalgias.  Skin:  Negative for rash.  Neurological:        Occasional headaches  Psychiatric/Behavioral:  Positive for depression. The patient has insomnia.        Anxiety  All other systems reviewed and are negative. Past Medical History:  Diagnosis Date   Anxiety    Arthritis    Cataract    left   Depression    Diabetes mellitus without complication ([place])    Environmental and seasonal allergies    GERD (gastroesophageal reflux disease)    Hyperlipidemia    Hypertension    Joint pain    as reported by patient   Right thalamic infarction ([place]) 04/29/2021   Stroke ([place]) 04/2021   admitted at [facility]   Urinary incontinence    as stated by patient   Past Surgical History:  Procedure Laterality Date   AORTIC VALVE REPLACEMENT N/A 08/04/2021   Procedure: RESECTION AORTIC VALVE TUMOR;  Surgeon: [name], MD;  Location: [place];  Service: Open Heart Surgery;  Laterality: N/A;   BREAST EXCISIONAL BIOPSY Right 1999   neg   BREAST LUMPECTOMY Right 2005   as reported by patient   BUBBLE STUDY  04/24/2021   Procedure: BUBBLE STUDY;  Surgeon: Sueanne Margarita, MD;  Location: [place];  Service: Cardiovascular;;   CATARACT EXTRACTION  12/2011   CATARACT EXTRACTION W/PHACO Left 12/02/2016   Procedure: CATARACT EXTRACTION PHACO AND INTRAOCULAR LENS PLACEMENT ([place]);  Surgeon: Estill Cotta, MD;  Location: [facility];  Service: Ophthalmology;  Laterality: Left;  [place] 2:14 AP% 28.4 CDE 59.73 Fluid pack lot # [id number]   CORONARY ARTERY BYPASS GRAFT N/A 08/04/2021   Procedure: CORONARY ARTERY BYPASS GRAFTING (CABG) X  4  USING LEFT INTERNAL MAMMARY ARTERY AND RIGHT GREATER SAPHENOUS VEIN ENDOSCOPIC CONDUITS;  Surgeon: [name], MD;  Location: [place];  Service: Open Heart Surgery;  Laterality: N/A;   DIAGNOSTIC  LAPAROSCOPY     EYE SURGERY     IR CT HEAD LTD  04/19/2021   IR PERCUTANEOUS ART THROMBECTOMY/INFUSION INTRACRANIAL INC DIAG ANGIO  04/19/2021       IR PERCUTANEOUS ART THROMBECTOMY/INFUSION INTRACRANIAL INC DIAG ANGIO  04/19/2021   IR US GUIDE VASC ACCESS LEFT  04/19/2021   RADIOLOGY WITH ANESTHESIA N/A 04/19/2021   Procedure: IR WITH ANESTHESIA;  Surgeon: Luanne Bras, MD;  Location: [place];  Service: Radiology;  Laterality: N/A;   TEE WITHOUT CARDIOVERSION N/A 04/24/2021   Procedure: TRANSESOPHAGEAL ECHOCARDIOGRAM (TEE);  Surgeon: Sueanne Margarita, MD;  Location: [facility];  Service: Cardiovascular;  Laterality: N/A;   TEE WITHOUT CARDIOVERSION N/A 08/04/2021   Procedure: TRANSESOPHAGEAL ECHOCARDIOGRAM (TEE);  Surgeon: [name], MD;  Location: [place];  Service: Open Heart Surgery;  Laterality: N/A;   TOTAL HIP ARTHROPLASTY Right 04/04/2018   Procedure: TOTAL HIP ARTHROPLASTY;  Surgeon: Dereck Leep, MD;  Location: [facility];  Service: Orthopedics;  Laterality: Right;   Family History  Problem Relation Age of Onset   Bone cancer Brother    Aneurysm Mother        brain   Diabetes Father    CAD Father    Heart failure Father    Colon cancer Father    Cancer Father    Alzheimer's disease Paternal Grandmother    Dementia Paternal Grandmother    Mental illness Paternal Grandfather    Healthy Daughter    Healthy Son    Breast cancer Neg Hx    Social History:  reports that she quit smoking about 14 years ago. Her smoking use included cigarettes. She has a 37.50 pack-year smoking history. She has never used smokeless tobacco. She reports that she does not currently use alcohol. She reports that she does not use drugs. Allergies:  Allergies  Allergen Reactions   Jardiance [Empagliflozin] Itching    Recurrent mycotic infections   Penicillins Rash and Other (See Comments)    Has patient had a PCN reaction causing immediate rash, facial/tongue/throat swelling, SOB or  lightheadedness with hypotension: No Has patient had a PCN reaction causing severe rash involving mucus membranes or skin necrosis: No Has patient had a PCN reaction that required hospitalization No Has patient had a PCN reaction occurring within the last 10 years: No If all of the above answers are "NO", then may proceed with Cephalosporin use.    Medications Prior to Admission  Medication Sig Dispense Refill   amLODipine (NORVASC) 10 MG tablet Take 1 tablet (10 mg total) by mouth at bedtime. 90 tablet 1   aspirin 81 MG chewable tablet Chew 1 tablet (81 mg total) by mouth daily.     atorvastatin (LIPITOR) 40 MG tablet Take 1 tablet (40 mg total) by mouth daily. 90 tablet 1   gabapentin (NEURONTIN) 300 MG capsule Take 1 capsule (300 mg total) by mouth at bedtime. For neuropathic pain 30 capsule 2   glyBURIDE (DIABETA) 5 MG tablet Take 1 tablet (5 mg total) by mouth daily with breakfast. 90 tablet 1   hydrALAZINE (APRESOLINE) 10 MG tablet  Take 1 tablet (10 mg total) by mouth at bedtime. 90 tablet 1   insulin glargine-yfgn (SEMGLEE, YFGN,) 100 UNIT/ML Pen Inject 20 Units into the skin every evening. 15 mL 1   insulin glargine-yfgn (SEMGLEE, YFGN,) 100 UNIT/ML SOPN Inject 10 Units into the skin 2 (two) times daily. 15 mL 1   losartan (COZAAR) 50 MG tablet Take 1 tablet (50 mg total) by mouth 2 (two) times daily. 180 tablet 1   metFORMIN (GLUCOPHAGE) 500 MG tablet Take 2 tablets (1,000 mg total) by mouth 2 (two) times daily with a meal. 360 tablet 1   methocarbamol (ROBAXIN) 500 MG tablet Take 1 tablet (500 mg total) by mouth 2 (two) times daily as needed for muscle spasms. 60 tablet 0   Multiple Vitamin (MULTIVITAMIN) tablet Take 1 tablet by mouth daily.     ondansetron (ZOFRAN ODT) 4 MG disintegrating tablet Take 1 tablet (4 mg total) by mouth every 8 (eight) hours as needed for nausea or vomiting. 20 tablet 2   pantoprazole (PROTONIX) 40 MG tablet Take 1 tablet (40 mg total) by mouth at bedtime.  90 tablet 1   rOPINIRole (REQUIP) 0.5 MG tablet Take 1 tablet (0.5 mg total) by mouth at bedtime. 90 tablet 1   sertraline (ZOLOFT) 100 MG tablet Take 1 tablet (100 mg total) by mouth daily. 30 tablet 0   [DISCONTINUED] diclofenac Sodium (VOLTAREN) 1 % GEL Apply 2 g topically 4 (four) times daily as needed (pain). 100 g 0   acetaminophen (TYLENOL) 325 MG tablet Take 2 tablets (650 mg total) by mouth every 4 (four) hours as needed for mild pain (or temp > 37.5 C (99.5 F)). (Patient not taking: Reported on 07/14/2021)     ALPRAZolam (XANAX) 0.5 MG tablet TAKE 1 TABLET BY MOUTH 2 TIMES DAILY AS NEEDED FOR ANXIETY. (Patient taking differently: Take 0.5 mg by mouth 2 (two) times daily as needed for anxiety.) 30 tablet 5   Blood Glucose Monitoring Suppl (FREESTYLE FREEDOM LITE) w/Device KIT USE AS DIRECTED 1 kit 0   glucose blood (FREESTYLE LITE) test strip Also needs Lancets. Use to check blood sugar up to four times a day for insulin dependant diabetes 100 each 12   Insulin Pen Needle (UNIFINE PENTIPS) 31G X 6 MM MISC use with saxenda twice daily 100 each 0   Lancets (FREESTYLE) lancets USE TO CHECK BLOOD SUGAR UP TO 4 TIMES A DAY 100 each 3   saccharomyces boulardii (FLORASTOR) 250 MG capsule Take 1 capsule (250 mg total) by mouth 2 (two) times daily. (Patient not taking: Reported on 07/14/2021) 28 capsule 8   senna-docusate (SENOKOT-S) 8.6-50 MG tablet Take 2 tablets by mouth at bedtime. (Patient not taking: Reported on 07/14/2021)      Drug Regimen Review Drug regimen was reviewed and remains appropriate with no significant issues identified  Home: Home Living Family/patient expects to be discharged to:: Private residence Living Arrangements: Children Available Help at Discharge: Family, Available 24 hours/day Type of Home: House Home Access: Stairs to enter [employer] of Steps: 3 Home Layout: One level Bathroom Shower/Tub: Chiropodist: Standard Home Equipment:  Environmental consultant - 2 wheels, [employer] - single point, Civil engineer, contracting, Bedside commode, Wheelchair - manual   Functional History: Prior Function Level of Independence: Independent with assistive device(s) Comments: driving and independent with ADLs, daughter assist with meds and cooking/cleaning  Functional Status:  Mobility: Bed Mobility Overal bed mobility: Needs Assistance Bed Mobility: Rolling, Sidelying to Sit, Sit to Supine Rolling:  Min guard Sidelying to sit: Mod assist Supine to sit: Mod assist, +2 for physical assistance Sit to supine: Min assist Sit to sidelying: Mod assist General bed mobility comments: pt requires assistance to elevate trunk into sitting to maintain sternal precautions. Pt able to roll but unable to maintain rolled position, falls back Transfers Overall transfer level: Needs assistance Equipment used: Rolling walker (2 wheeled) Transfers: Sit to/from Stand Sit to Stand: Mod assist General transfer comment: assist to power into standing due to LE weakness Ambulation/Gait Ambulation/Gait assistance: Min assist Gait Distance (Feet): 15 Feet Assistive device: Rolling walker (2 wheeled) Gait Pattern/deviations: Step-to pattern General Gait Details: pt with slowed step-to gait, increased time for swing phase of LLE Gait velocity: reduced Gait velocity interpretation: <1.8 ft/sec, indicate of risk for recurrent falls    ADL: ADL Overall ADL's : Needs assistance/impaired Eating/Feeding: Set up, Sitting Eating/Feeding Details (indicate cue type and reason): cueing to use L UE as stabilizer Grooming: Minimal assistance, Sitting Upper Body Dressing : Moderate assistance, Sitting Lower Body Dressing: Total assistance, Sit to/from stand Lower Body Dressing Details (indicate cue type and reason): requires assist for socks, min assist sit to stand Toilet Transfer: Moderate assistance, RW Toilet Transfer Details (indicate cue type and reason): mod assist to side step towards  [facility] with RW Functional mobility during ADLs: Minimal assistance, Rolling walker, Cueing for safety, Cueing for sequencing General ADL Comments: pt limited by sternal precautions, L sided weakness from Hx of CVA and decreased activity tolerance  Cognition: Cognition Overall Cognitive Status: Impaired/Different from baseline Orientation Level: Oriented X4 Cognition Arousal/Alertness: Awake/alert Behavior During Therapy: WFL for tasks assessed/performed Overall Cognitive Status: Impaired/Different from baseline Area of Impairment: Memory Memory: Decreased recall of precautions Awareness: Emergent Problem Solving: Slow processing, Difficulty sequencing, Requires verbal cues General Comments: pt following commands, decreased recall of sternal precautions and requires cueing for problem solving  Physical Exam: Blood pressure (!) 108/52, pulse 86, temperature 97.9 F (36.6 C), temperature source Oral, resp. rate 18, height 5' 7"  (1.702 m), weight 89.3 kg, SpO2 90 %. Physical Exam Gen: no distress, normal appearing HEENT: oral mucosa pink and moist, NCAT Cardio: Reg rate Chest: normal effort, normal rate of breathing Abd: soft, non-distended Ext: trace bilateral lower extremity edema Psych: pleasant, normal affect Skin:    Comments: Midline chest incision clean and dry. Mild right thigh bruising Neurological:     Comments: Patient is alert.  No acute distress.  Follows commands and oriented x3.  She recalls her latest inpatient rehab admission. Residual left sided weakness from prior stroke, wrist drop.  Results for orders placed or performed during the hospital encounter of 08/04/21 (from the past 48 hour(s))  Glucose, capillary     Status: Abnormal   Collection Time: 08/09/21 11:22 AM  Result Value Ref Range   Glucose-Capillary 153 (H) 70 - 99 mg/dL    Comment: Glucose reference range applies only to samples taken after fasting for at least 8 hours.   Comment 1 Notify RN    Comment  2 Document in Chart   Glucose, capillary     Status: Abnormal   Collection Time: 08/09/21  4:06 PM  Result Value Ref Range   Glucose-Capillary 134 (H) 70 - 99 mg/dL    Comment: Glucose reference range applies only to samples taken after fasting for at least 8 hours.   Comment 1 Notify RN    Comment 2 Document in Chart   Glucose, capillary     Status: Abnormal  Collection Time: 08/09/21  9:03 PM  Result Value Ref Range   Glucose-Capillary 154 (H) 70 - 99 mg/dL    Comment: Glucose reference range applies only to samples taken after fasting for at least 8 hours.  CBC     Status: Abnormal   Collection Time: 08/10/21 12:35 AM  Result Value Ref Range   WBC 10.7 (H) 4.0 - 10.5 K/uL   RBC 3.73 (L) 3.87 - 5.11 MIL/uL   Hemoglobin 10.4 (L) 12.0 - 15.0 g/dL   HCT 31.6 (L) 36.0 - 46.0 %   MCV 84.7 80.0 - 100.0 fL   MCH 27.9 26.0 - 34.0 pg   MCHC 32.9 30.0 - 36.0 g/dL   RDW 12.8 11.5 - 15.5 %   Platelets 322 150 - 400 K/uL   nRBC 0.0 0.0 - 0.2 %    Comment: Performed at [facility], [place] [address], [place], [place] [address]  Basic metabolic panel     Status: Abnormal   Collection Time: 08/10/21 12:35 AM  Result Value Ref Range   Sodium 135 135 - 145 mmol/L   Potassium 3.7 3.5 - 5.1 mmol/L   Chloride 89 (L) 98 - 111 mmol/L   CO2 31 22 - 32 mmol/L   Glucose, Bld 156 (H) 70 - 99 mg/dL    Comment: Glucose reference range applies only to samples taken after fasting for at least 8 hours.   BUN 9 8 - 23 mg/dL   Creatinine, Ser 0.72 0.44 - 1.00 mg/dL   Calcium 8.8 (L) 8.9 - 10.3 mg/dL   GFR, Estimated >60 >60 mL/min    Comment: (NOTE) Calculated using the CKD-EPI Creatinine Equation (2021)    Anion gap 15 5 - 15    Comment: Performed at [facility] [address] [place], [place] [address]  Magnesium     Status: Abnormal   Collection Time: 08/10/21 12:35 AM  Result Value Ref Range   Magnesium 1.3 (L) 1.7 - 2.4 mg/dL    Comment: Performed at [facility] [address] [place], [place] [address]  Glucose, capillary     Status: Abnormal   Collection Time: 08/10/21  6:07 AM  Result Value Ref Range   Glucose-Capillary 150 (H) 70 - 99 mg/dL    Comment: Glucose reference range applies only to samples taken after fasting for at least 8 hours.  Glucose, capillary     Status: Abnormal   Collection Time: 08/10/21  7:25 AM  Result Value Ref Range   Glucose-Capillary 125 (H) 70 - 99 mg/dL    Comment: Glucose reference range applies only to samples taken after fasting for at least 8 hours.  Glucose, capillary     Status: Abnormal   Collection Time: 08/10/21 11:21 AM  Result Value Ref Range   Glucose-Capillary 134 (H) 70 - 99 mg/dL    Comment: Glucose reference range applies only to samples taken after fasting for at least 8 hours.   Comment 1 Notify RN    Comment 2 Document in Chart   Glucose, capillary     Status: Abnormal   Collection Time: 08/10/21  4:21 PM  Result Value Ref Range   Glucose-Capillary 152 (H) 70 - 99 mg/dL    Comment: Glucose reference range applies only to samples taken after fasting for at least 8 hours.   Comment 1 Notify RN    Comment 2 Document in Chart   Glucose, capillary     Status: Abnormal  Collection Time: 08/10/21  9:54 PM  Result Value Ref Range   Glucose-Capillary 162 (H) 70 - 99 mg/dL    Comment: Glucose reference range applies only to samples taken after fasting for at least 8 hours.  Glucose, capillary     Status: Abnormal   Collection Time: 08/11/21  6:25 AM  Result Value Ref Range   Glucose-Capillary 136 (H) 70 - 99 mg/dL    Comment: Glucose reference range applies only to samples taken after fasting for at least 8 hours.   No results found.     Medical Problem List and Plan: 1.  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  Status post median sternotomy resection of fibroelastoma from aortic valve/CABG x4 08/04/2021.  Sternal precautions  -patient may shower but incision must be  covered  -ELOS/Goals: 7-10 days 2.  Antithrombotics: -DVT/anticoagulation:  Pharmaceutical: Lovenox  -antiplatelet therapy: Aspirin 325 mg daily 3. Post-stroke left hand pain: Neurontin 300 mg nightly, Robaxin and tramadol as needed. Add Cymbalta 34m daily.  4. Depression: Zoloft 100 mg daily, Xanax as needed. Check Vitamin D and magnesium levels tomorrow  -antipsychotic agents: N/A 5. Neuropsych: This patient is capable of making decisions on her own behalf. 6. Skin/Wound Care: Routine skin checks 7. Fluids/Electrolytes/Nutrition: Routine in and outs with follow-up chemistries 8.ABLA.Follow up CBC 9.  Diabetes mellitus with peripheral neuropathy.  Hemoglobin A1c 7.8.  DiaBeta 5 mg daily, Glucophage 1000 mg twice daily 10  Hypertension.  Norvasc 10 mg daily, Lasix 40 mg daily, hydralazine 10 mg nightly, Cozaar 100 mg daily, Lopressor 12.5 mg twice daily.  Monitor with increased mobility 11.  GERD.  Protonix 12.  Restless leg syndrome.  Requip nightly 13.  Hyperlipidemia.  Lipitor 14.  Obesity.  BMI 30.83.  Dietary follow-up 15.  History of right thalamic infarction 5/22.  Received CIR.  Continue aspirin. 16. Left wrist drop: splint ordered  I have personally performed a face to face diagnostic evaluation, including, but not limited to relevant history and physical exam findings, of this patient and developed relevant assessment and plan.  Additionally, I have reviewed and concur with the physician assistant's documentation above.  KLeeroy Cha MD   DLavon PaganiniAStannards PA-C 08/11/2021

## 2021-08-11 NOTE — H&P (Signed)
Physical Medicine and Rehabilitation Admission H&P   CC: Debility  HPI: [name] is a 65 year old right-handed female with history of diabetes mellitus hypertension hyperlipidemia remote tobacco use obesity with BMI 30.83, anxiety/depression as well as right thalamic infarction receiving CIR 04/29/2021 to 05/27/2021.  She was discharged home ambulating 200 feet contact-guard assist.  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  Per chart review patient lives with her daughter.  1 level home 3 steps to entry.  Independent with assistive device driving short distances.  Presented 08/04/2021 with noted history findings of papillary fibroelastoma identified during work-up of CVA and plan was for patient to complete inpatient rehab services of which she did.  She had a coronary CT which showed significant stenosis in all the coronaries with definite impairment of flow in the LAD and diagonal distribution and due to these findings patient underwent median sternotomy resection of fibroelastoma from aortic valve/CABG x4 08/04/2021 per Dr. Roxan Hockey.  Sternal precautions as indicated.  Hospital course acute blood loss anemia 7.7 transfused latest hemoglobin 10.4.  She currently remains on aspirin for history of CVA as well as CABG.  Subcutaneous Lovenox initiated for DVT prophylaxis.  Tolerating a regular diet.  Therapy evaluations completed due to patient decreased functional mobility was admitted for a comprehensive rehab program. She complains of severe left hand pain.   Review of Systems  Constitutional:  Negative for chills and fever.  HENT:  Negative for hearing loss.   Eyes:  Negative for blurred vision and double vision.  Respiratory:  Negative for cough.        Occasional shortness of breath on exertion  Cardiovascular:  Negative for chest pain and palpitations.  Gastrointestinal:  Positive for constipation. Negative for  diarrhea, heartburn and nausea.       GERD  Genitourinary:  Negative for dysuria, flank pain and hematuria.  Musculoskeletal:  Positive for joint pain and myalgias.  Skin:  Negative for rash.  Neurological:        Occasional headaches  Psychiatric/Behavioral:  Positive for depression. The patient has insomnia.        Anxiety  All other systems reviewed and are negative. Past Medical History:  Diagnosis Date   Anxiety    Arthritis    Cataract    left   Depression    Diabetes mellitus without complication ([place])    Environmental and seasonal allergies    GERD (gastroesophageal reflux disease)    Hyperlipidemia    Hypertension    Joint pain    as reported by patient   Right thalamic infarction ([place]) 04/29/2021   Stroke ([place]) 04/2021   admitted at [facility]   Urinary incontinence    as stated by patient   Past Surgical History:  Procedure Laterality Date   AORTIC VALVE REPLACEMENT N/A 08/04/2021   Procedure: RESECTION AORTIC VALVE TUMOR;  Surgeon: [name], MD;  Location: [place];  Service: Open Heart Surgery;  Laterality: N/A;   BREAST EXCISIONAL BIOPSY Right 1999   neg   BREAST LUMPECTOMY Right 2005   as reported by patient   BUBBLE STUDY  04/24/2021   Procedure: BUBBLE STUDY;  Surgeon: Sueanne Margarita, MD;  Location: [place];  Service: Cardiovascular;;   CATARACT EXTRACTION  12/2011   CATARACT EXTRACTION W/PHACO Left 12/02/2016   Procedure: CATARACT EXTRACTION PHACO AND INTRAOCULAR LENS PLACEMENT ([place]);  Surgeon: Estill Cotta, MD;  Location: [facility];  Service:  Ophthalmology;  Laterality: Left;  [place] 2:14 AP% 28.4 CDE 59.73 Fluid pack lot # [id number]   CORONARY ARTERY BYPASS GRAFT N/A 08/04/2021   Procedure: CORONARY ARTERY BYPASS GRAFTING (CABG) X  4  USING LEFT INTERNAL MAMMARY ARTERY AND RIGHT GREATER SAPHENOUS VEIN ENDOSCOPIC CONDUITS;  Surgeon: [name], MD;  Location: [place];  Service: Open Heart Surgery;  Laterality: N/A;   DIAGNOSTIC  LAPAROSCOPY     EYE SURGERY     IR CT HEAD LTD  04/19/2021   IR PERCUTANEOUS ART THROMBECTOMY/INFUSION INTRACRANIAL INC DIAG ANGIO  04/19/2021       IR PERCUTANEOUS ART THROMBECTOMY/INFUSION INTRACRANIAL INC DIAG ANGIO  04/19/2021   IR US GUIDE VASC ACCESS LEFT  04/19/2021   RADIOLOGY WITH ANESTHESIA N/A 04/19/2021   Procedure: IR WITH ANESTHESIA;  Surgeon: Luanne Bras, MD;  Location: [place];  Service: Radiology;  Laterality: N/A;   TEE WITHOUT CARDIOVERSION N/A 04/24/2021   Procedure: TRANSESOPHAGEAL ECHOCARDIOGRAM (TEE);  Surgeon: Sueanne Margarita, MD;  Location: [facility];  Service: Cardiovascular;  Laterality: N/A;   TEE WITHOUT CARDIOVERSION N/A 08/04/2021   Procedure: TRANSESOPHAGEAL ECHOCARDIOGRAM (TEE);  Surgeon: [name], MD;  Location: [place];  Service: Open Heart Surgery;  Laterality: N/A;   TOTAL HIP ARTHROPLASTY Right 04/04/2018   Procedure: TOTAL HIP ARTHROPLASTY;  Surgeon: Dereck Leep, MD;  Location: [facility];  Service: Orthopedics;  Laterality: Right;   Family History  Problem Relation Age of Onset   Bone cancer Brother    Aneurysm Mother        brain   Diabetes Father    CAD Father    Heart failure Father    Colon cancer Father    Cancer Father    Alzheimer's disease Paternal Grandmother    Dementia Paternal Grandmother    Mental illness Paternal Grandfather    Healthy Daughter    Healthy Son    Breast cancer Neg Hx    Social History:  reports that she quit smoking about 14 years ago. Her smoking use included cigarettes. She has a 37.50 pack-year smoking history. She has never used smokeless tobacco. She reports that she does not currently use alcohol. She reports that she does not use drugs. Allergies:  Allergies  Allergen Reactions   Jardiance [Empagliflozin] Itching    Recurrent mycotic infections   Penicillins Rash and Other (See Comments)    Has patient had a PCN reaction causing immediate rash, facial/tongue/throat swelling, SOB or  lightheadedness with hypotension: No Has patient had a PCN reaction causing severe rash involving mucus membranes or skin necrosis: No Has patient had a PCN reaction that required hospitalization No Has patient had a PCN reaction occurring within the last 10 years: No If all of the above answers are "NO", then may proceed with Cephalosporin use.    Medications Prior to Admission  Medication Sig Dispense Refill   acetaminophen (TYLENOL) 325 MG tablet Take 2 tablets (650 mg total) by mouth every 4 (four) hours as needed for mild pain (or temp > 37.5 C (99.5 F)). (Patient not taking: Reported on 07/14/2021)     ALPRAZolam (XANAX) 0.5 MG tablet TAKE 1 TABLET BY MOUTH 2 TIMES DAILY AS NEEDED FOR ANXIETY. (Patient taking differently: Take 0.5 mg by mouth 2 (two) times daily as needed for anxiety.) 30 tablet 5   amLODipine (NORVASC) 10 MG tablet Take 1 tablet (10 mg total) by mouth at bedtime. 90 tablet 1   aspirin EC 325 MG EC tablet Take  1 tablet (325 mg total) by mouth daily. 30 tablet 0   atorvastatin (LIPITOR) 40 MG tablet Take 1 tablet (40 mg total) by mouth daily. 90 tablet 1   Blood Glucose Monitoring Suppl (FREESTYLE FREEDOM LITE) w/Device KIT USE AS DIRECTED 1 kit 0   [START ON 09/01/2021] diclofenac Sodium (VOLTAREN) 1 % GEL Apply 2 g topically 4 (four) times daily as needed (pain). 100 g 0   furosemide (LASIX) 40 MG tablet Take 1 tablet (40 mg total) by mouth daily. 5 tablet 0   gabapentin (NEURONTIN) 300 MG capsule Take 1 capsule (300 mg total) by mouth at bedtime. For neuropathic pain 30 capsule 2   glucose blood (FREESTYLE LITE) test strip Also needs Lancets. Use to check blood sugar up to four times a day for insulin dependant diabetes 100 each 12   glyBURIDE (DIABETA) 5 MG tablet Take 1 tablet (5 mg total) by mouth daily with breakfast. 90 tablet 1   hydrALAZINE (APRESOLINE) 10 MG tablet Take 1 tablet (10 mg total) by mouth at bedtime. 90 tablet 1   insulin glargine-yfgn (SEMGLEE, YFGN,)  100 UNIT/ML Pen Inject 20 Units into the skin every evening. 15 mL 1   insulin glargine-yfgn (SEMGLEE, YFGN,) 100 UNIT/ML SOPN Inject 10 Units into the skin 2 (two) times daily. 15 mL 1   Insulin Pen Needle (UNIFINE PENTIPS) 31G X 6 MM MISC use with saxenda twice daily 100 each 0   Lancets (FREESTYLE) lancets USE TO CHECK BLOOD SUGAR UP TO 4 TIMES A DAY 100 each 3   losartan (COZAAR) 50 MG tablet Take 1 tablet (50 mg total) by mouth 2 (two) times daily. 180 tablet 1   metFORMIN (GLUCOPHAGE) 500 MG tablet Take 2 tablets (1,000 mg total) by mouth 2 (two) times daily with a meal. 360 tablet 1   methocarbamol (ROBAXIN) 500 MG tablet Take 1 tablet (500 mg total) by mouth 2 (two) times daily as needed for muscle spasms. 60 tablet 0   metoprolol tartrate (LOPRESSOR) 25 MG tablet Take 0.5 tablets (12.5 mg total) by mouth 2 (two) times daily.     Multiple Vitamin (MULTIVITAMIN) tablet Take 1 tablet by mouth daily.     ondansetron (ZOFRAN ODT) 4 MG disintegrating tablet Take 1 tablet (4 mg total) by mouth every 8 (eight) hours as needed for nausea or vomiting. 20 tablet 2   pantoprazole (PROTONIX) 40 MG tablet Take 1 tablet (40 mg total) by mouth at bedtime. 90 tablet 1   potassium chloride SA (KLOR-CON) 20 MEQ tablet Take 1 tablet (20 mEq total) by mouth daily. 5 tablet    rOPINIRole (REQUIP) 0.5 MG tablet Take 1 tablet (0.5 mg total) by mouth at bedtime. 90 tablet 1   sertraline (ZOLOFT) 100 MG tablet Take 1 tablet (100 mg total) by mouth daily. 30 tablet 0   traMADol (ULTRAM) 50 MG tablet Take 1 tablet (50 mg total) by mouth every 6 (six) hours as needed for moderate pain or severe pain. 30 tablet     Drug Regimen Review Drug regimen was reviewed and remains appropriate with no significant issues identified   Home: Home Living Family/patient expects to be discharged to:: Private residence Living Arrangements: Children Available Help at Discharge: Family, Available 24 hours/day Type of Home:  House Home Access: Stairs to enter [employer] of Steps: 3 Home Layout: One level Bathroom Shower/Tub: Chiropodist: Standard Home Equipment: Environmental consultant - 2 wheels, [employer] - single point, [employer], Engineer, civil (consulting), Wheelchair -  manual   Functional History: Prior Function Level of Independence: Independent with assistive device(s) Comments: driving and independent with ADLs, daughter assist with meds and cooking/cleaning   Functional Status:  Mobility: Bed Mobility Overal bed mobility: Needs Assistance Bed Mobility: Rolling, Sidelying to Sit, Sit to Supine Rolling: Min guard Sidelying to sit: Mod assist Supine to sit: Mod assist, +2 for physical assistance Sit to supine: Min assist Sit to sidelying: Mod assist General bed mobility comments: pt requires assistance to elevate trunk into sitting to maintain sternal precautions. Pt able to roll but unable to maintain rolled position, falls back Transfers Overall transfer level: Needs assistance Equipment used: Rolling walker (2 wheeled) Transfers: Sit to/from Stand Sit to Stand: Mod assist General transfer comment: assist to power into standing due to LE weakness Ambulation/Gait Ambulation/Gait assistance: Min assist Gait Distance (Feet): 15 Feet Assistive device: Rolling walker (2 wheeled) Gait Pattern/deviations: Step-to pattern General Gait Details: pt with slowed step-to gait, increased time for swing phase of LLE Gait velocity: reduced Gait velocity interpretation: <1.8 ft/sec, indicate of risk for recurrent falls   ADL: ADL Overall ADL's : Needs assistance/impaired Eating/Feeding: Set up, Sitting Eating/Feeding Details (indicate cue type and reason): cueing to use L UE as stabilizer Grooming: Minimal assistance, Sitting Upper Body Dressing : Moderate assistance, Sitting Lower Body Dressing: Total assistance, Sit to/from stand Lower Body Dressing Details (indicate cue type and reason):  requires assist for socks, min assist sit to stand Toilet Transfer: Moderate assistance, RW Toilet Transfer Details (indicate cue type and reason): mod assist to side step towards [facility] with RW Functional mobility during ADLs: Minimal assistance, Rolling walker, Cueing for safety, Cueing for sequencing General ADL Comments: pt limited by sternal precautions, L sided weakness from Hx of CVA and decreased activity tolerance   Cognition: Cognition Overall Cognitive Status: Impaired/Different from baseline Orientation Level: Oriented X4 Cognition Arousal/Alertness: Awake/alert Behavior During Therapy: WFL for tasks assessed/performed Overall Cognitive Status: Impaired/Different from baseline Area of Impairment: Memory Memory: Decreased recall of precautions Awareness: Emergent Problem Solving: Slow processing, Difficulty sequencing, Requires verbal cues General Comments: pt following commands, decreased recall of sternal precautions and requires cueing for problem solving  Physical Exam: Blood pressure 109/62, pulse 82, temperature 99.5 F (37.5 C), temperature source Oral, resp. rate 17, height 5' 7"  (1.702 m), weight 89.4 kg, SpO2 94 %. Physical Exam Gen: no distress, normal appearing HEENT: oral mucosa pink and moist, NCAT Cardio: Reg rate Chest: normal effort, normal rate of breathing Abd: soft, non-distended Ext: trace bilateral lower extremity edema Psych: pleasant, normal affect Skin:    Comments: Midline chest incision clean and dry. Mild right thigh bruising Neurological:     Comments: Patient is alert.  No acute distress.  Follows commands and oriented x3.  She recalls her latest inpatient rehab admission. Residual left sided weakness from prior stroke, wrist drop.  Results for orders placed or performed during the hospital encounter of 08/11/21 (from the past 48 hour(s))  CBC     Status: Abnormal   Collection Time: 08/11/21  3:49 PM  Result Value Ref Range   WBC 11.2 (H)  4.0 - 10.5 K/uL   RBC 3.83 (L) 3.87 - 5.11 MIL/uL   Hemoglobin 10.8 (L) 12.0 - 15.0 g/dL   HCT 33.0 (L) 36.0 - 46.0 %   MCV 86.2 80.0 - 100.0 fL   MCH 28.2 26.0 - 34.0 pg   MCHC 32.7 30.0 - 36.0 g/dL   RDW 13.1 11.5 - 15.5 %   Platelets  412 (H) 150 - 400 K/uL   nRBC 0.0 0.0 - 0.2 %    Comment: Performed at [facility], [place] [address], [place], [place] [address]  Creatinine, serum     Status: None   Collection Time: 08/11/21  3:49 PM  Result Value Ref Range   Creatinine, Ser 0.95 0.44 - 1.00 mg/dL   GFR, Estimated >60 >60 mL/min    Comment: (NOTE) Calculated using the CKD-EPI Creatinine Equation (2021) Performed at [place] [address] [place], [place] [address]   Glucose, capillary     Status: Abnormal   Collection Time: 08/11/21  4:32 PM  Result Value Ref Range   Glucose-Capillary 177 (H) 70 - 99 mg/dL    Comment: Glucose reference range applies only to samples taken after fasting for at least 8 hours.  Glucose, capillary     Status: Abnormal   Collection Time: 08/11/21  8:59 PM  Result Value Ref Range   Glucose-Capillary 128 (H) 70 - 99 mg/dL    Comment: Glucose reference range applies only to samples taken after fasting for at least 8 hours.   No results found.     Medical Problem List and Plan: 1.  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  Status post median sternotomy resection of fibroelastoma from aortic valve/CABG x4 08/04/2021.  Sternal precautions  -patient may shower but incision must be covered  -ELOS/Goals: 7-10 days  Admit to CIR 2.  Antithrombotics: -DVT/anticoagulation:  Pharmaceutical: Lovenox  -antiplatelet therapy: Aspirin 325 mg daily 3. Post-stroke left hand pain: Neurontin 300 mg nightly, Robaxin and tramadol as needed. Add Cymbalta 20m daily.  4. Depression: Zoloft 100 mg daily, Xanax as needed. Check Vitamin D and magnesium levels tomorrow  -antipsychotic agents: N/A 5. Neuropsych: This  patient is capable of making decisions on her own behalf. 6. Skin/Wound Care: Routine skin checks 7. Fluids/Electrolytes/Nutrition: Routine in and outs with follow-up chemistries 8.ABLA.Follow up CBC 9.  Diabetes mellitus with peripheral neuropathy.  Hemoglobin A1c 7.8.  DiaBeta 5 mg daily, Glucophage 1000 mg twice daily 10  Hypertension.  Norvasc 10 mg daily, Lasix 40 mg daily, hydralazine 10 mg nightly, Cozaar 100 mg daily, Lopressor 12.5 mg twice daily.  Monitor with increased mobility 11.  GERD.  Protonix 12.  Restless leg syndrome.  Requip nightly 13.  Hyperlipidemia.  Lipitor 14.  Obesity.  BMI 30.83.  Dietary follow-up 15.  History of right thalamic infarction 5/22.  Received CIR.  Continue aspirin. 16. Left wrist drop: splint ordered  I have personally performed a face to face diagnostic evaluation, including, but not limited to relevant history and physical exam findings, of this patient and developed relevant assessment and plan.  Additionally, I have reviewed and concur with the physician assistant's documentation above.  KLeeroy Cha MD  DLavon PaganiniAWhitewater PA-C 08/11/2021

## 2021-08-11 NOTE — Progress Notes (Addendum)
Inpatient Rehabilitation Medication Review by a Pharmacist  A complete drug regimen review was completed for this patient to identify any potential clinically significant medication issues.  High Risk Drug Classes Is patient taking? Indication by Medication  Antipsychotic No   Anticoagulant Yes LMWH for VTE prophx.  Antibiotic No   Opioid Yes Tramadol for Mod-severe pain  Antiplatelet Yes ASA for CAD s/p CABG  Hypoglycemics/insulin Yes Glyburide, Semglee (ordered for discharge), metformin, SSI, for DM  Vasoactive Medication Yes Norvasc, metoprolol, Lasix, hydralazine, losartan, for HTN with CAD  Chemotherapy No   Other Yes Kdur and Magox for suppl while on Lasix,      Type of Medication Issue Identified Description of Issue Recommendation(s)  Drug Interaction(s) (clinically significant)     Duplicate Therapy     Allergy     No Medication Administration End Date     Incorrect Dose     Additional Drug Therapy Needed  Home Semglee ordered to be resumed at discharge. (Levemir used as inpatient) Resume Semglee if CBGs dictate. No inpatient insulin since 8/25.  Significant med changes from prior encounter (inform family/care partners about these prior to discharge).    Other       Clinically significant medication issues were identified that warrant physician communication and completion of prescribed/recommended actions by midnight of the next day:  Yes  Name of provider notified for urgent issues identified: Raulkar,  Provider Method of Notification: chat  Pharmacist comments:   Time spent performing this drug regimen review (minutes):  10-6mn  Karl Knarr S. RAlford Highland PharmD, BCPS Clinical Staff Pharmacist [email or web address] RWayland Salinas8/29/2022 3:36 PM

## 2021-08-11 NOTE — Progress Notes (Signed)
Patient arrived from [place], [facility]. Assigned to room 5C07, [facility].Patient appears alert and denies pain atn this time

## 2021-08-11 NOTE — Progress Notes (Signed)
PMR Admission Coordinator Pre-Admission Assessment   Patient: [name] is an 65 y.o., female MRN: [id number] DOB: 10-29-1956 Height: 5' 7"  (170.2 cm) Weight: 89.3 kg   Insurance Information HMO:     PPO:      PCP:      IPA:      80/20: yes     OTHER:  PRIMARYIval Bible      Policy#: [id number]      Subscriber: pt CM Name: Marzetta Board      Phone#: [phone]     Fax#: [phone] Pre-Cert#: [id number] Josem Kaufmann for CIR provided by Marzetta Board with [employer].  Updates due to fax listed above on 9/5.       Employer: [employer] Benefits:  Phone #: [phone]     Name:  Eff. Date: 12/14/16     Deduct: $300 (met)      Out of Pocket Max: $7900 (met)      Life Max: n/a CIR: 80%      SNF: 80% Outpatient: 80%     Co-Ins: 20% Home Health: 80%      Co-Ins: 20% DME: 80%     Co-Ins: 20% Providers:  SECONDARY: Medicare Part A      Policy#: [id number]     Phone#:    Financial Counselor:       Phone#:    The Therapist, art Information Summary" for patients in Inpatient Rehabilitation Facilities with attached "Privacy Act [place] Records" was provided and verbally reviewed with: Patient and Family   Emergency Contact Information Contact Information       Name Relation Home Work Mobile    [name] Daughter [phone]        [name] [phone]               Current Medical History  Patient Admitting Diagnosis: cardiac debility   History of Present Illness: [name] is a 65 year old right-handed female with history of diabetes mellitus hypertension hyperlipidemia remote tobacco use obesity with BMI 30.83, anxiety/depression, as well as right thalamic infarction receiving CIR 04/29/2021 to 05/27/2021.  She was discharged home ambulating 200 feet contact-guard assist.  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  Presented 08/04/2021 with plan to complete workup.  She had a coronary CT which showed  significant stenosis in all the coronaries with definite impairment of flow in the LAD and diagonal distribution and due to these findings patient underwent median sternotomy resection of fibroelastoma from aortic valve and CABG x4 08/04/2021 per Dr. Roxan Hockey.  Sternal precautions as indicated.  Hospital course acute blood loss anemia 7.7, transfused, and latest hemoglobin 10.4.  She currently remains on aspirin for history of CVA as well as CABG.  Subcutaneous Lovenox initiated for DVT prophylaxis.  Tolerating a regular diet.  Therapy evaluations completed due to patient decreased functional mobility was recommended for a comprehensive rehab program.     [facility] record [facility] has been reviewed by the rehabilitation admission coordinator and physician.   Past Medical History      Past Medical History:  Diagnosis Date   Anxiety     Arthritis     Cataract      left   Depression     Diabetes mellitus without complication ([place])     Environmental and seasonal allergies     GERD (gastroesophageal reflux disease)     Hyperlipidemia     Hypertension     Joint pain  as reported by patient   Right thalamic infarction [name]) 04/29/2021   Stroke ([place]) 04/2021    admitted at [facility]   Urinary incontinence      as stated by patient      Has the patient had major surgery during 100 days prior to admission? Yes   Family History   family history includes Alzheimer's disease in her paternal grandmother; Aneurysm in her mother; Bone cancer in her brother; CAD in her father; Cancer in her father; Colon cancer in her father; Dementia in her paternal grandmother; Diabetes in her father; Healthy in her daughter and son; Heart failure in her father; Mental illness in her paternal grandfather.   Current Medications   Current Facility-Administered Medications:    0.9 %  sodium chloride infusion, 250 mL, Intravenous, PRN, [name], MD   ALPRAZolam Duanne Moron) tablet 0.5  mg, 0.5 mg, Oral, BID PRN, [name], MD   amLODipine (NORVASC) tablet 10 mg, 10 mg, Oral, QHS, [name], MD, 10 mg at 08/10/21 2204   aspirin EC tablet 325 mg, 325 mg, Oral, Daily, 325 mg at 08/10/21 0954 **OR** aspirin chewable tablet 324 mg, 324 mg, Per Tube, Daily, [name], MD, 324 mg at 08/11/21 0905   atorvastatin (LIPITOR) tablet 40 mg, 40 mg, Oral, Daily, [name], MD, 40 mg at 08/11/21 2563   bisacodyl (DULCOLAX) EC tablet 10 mg, 10 mg, Oral, Daily, 10 mg at 08/11/21 0907 **OR** bisacodyl (DULCOLAX) suppository 10 mg, 10 mg, Rectal, Daily, [name], MD   enoxaparin (LOVENOX) injection 40 mg, 40 mg, Subcutaneous, QHS, [name], MD, 40 mg at 08/10/21 2204   furosemide (LASIX) tablet 40 mg, 40 mg, Oral, Daily, Lars Pinks M, PA-C, 40 mg at 08/11/21 8937   gabapentin (NEURONTIN) capsule 300 mg, 300 mg, Oral, QHS, [name], MD, 300 mg at 08/10/21 2204   hydrALAZINE (APRESOLINE) tablet 10 mg, 10 mg, Oral, QHS, [name], MD, 10 mg at 08/10/21 2204   insulin aspart (novoLOG) injection 0-15 Units, 0-15 Units, Subcutaneous, TID WC, [name], MD, 2 Units at 08/10/21 3428   losartan (COZAAR) tablet 100 mg, 100 mg, Oral, Daily, Gold, Wayne E, PA-C, 100 mg at 08/11/21 7681   magnesium hydroxide (MILK OF MAGNESIA) suspension 30 mL, 30 mL, Oral, Daily PRN, [name], MD   magnesium oxide (MAG-OX) tablet 400 mg, 400 mg, Oral, BID, Gold, Wayne E, PA-C, 400 mg at 08/11/21 1572   methocarbamol (ROBAXIN) tablet 500 mg, 500 mg, Oral, BID PRN, [name], MD   metoprolol tartrate (LOPRESSOR) tablet 12.5 mg, 12.5 mg, Oral, BID, 12.5 mg at 08/11/21 0906 **OR** metoprolol tartrate (LOPRESSOR) 25 mg/10 mL oral suspension 12.5 mg, 12.5 mg, Per Tube, BID, [name], MD   ondansetron [facility]) injection 4 mg, 4 mg, Intravenous, Q6H PRN, [name], MD, 4  mg at 08/10/21 1210   pantoprazole (PROTONIX) EC tablet 40 mg, 40 mg, Oral, QHS, [name], MD, 40 mg at 08/10/21 2204   potassium chloride SA (KLOR-CON) CR tablet 20 mEq, 20 mEq, Oral, Daily, Lars Pinks M, PA-C, 20 mEq at 08/11/21 6203   rOPINIRole (REQUIP) tablet 0.5 mg, 0.5 mg, Oral, QHS, [name], MD, 0.5 mg at 08/10/21 2204   sertraline (ZOLOFT) tablet 100 mg, 100 mg, Oral, Daily, [name], MD, 100 mg at 08/11/21 0907   sodium chloride flush (NS) 0.9 % injection 3 mL, 3  mL, Intravenous, Q12H, [name], MD, 3 mL at 08/11/21 0908   sodium chloride flush (NS) 0.9 % injection 3 mL, 3 mL, Intravenous, PRN, [name], MD   traMADol Veatrice Bourbon) tablet 50 mg, 50 mg, Oral, Q6H PRN, [name], MD, 50 mg at 08/10/21 2204   Patients Current Diet:  Diet Order                  Diet heart healthy/carb modified Room service appropriate? Yes with Assist; Fluid consistency: Thin  Diet effective now                         Precautions / Restrictions Precautions Precautions: Fall, Sternal Precaution Booklet Issued: Yes (comment) Precaution Comments: pt requires cues to avoid shoulder horizontal abduction Restrictions Weight Bearing Restrictions: Yes (sternal precautions) Other Position/Activity Restrictions: sternal precautions    Has the patient had 2 or more falls or a fall with injury in the past year? No   Prior Activity Level Limited Community (1-2x/wk): driving and independent PTA, was on CIR in May 22 for a CVA, has returned to independence since, daughter assists with meds/cooking   Prior Functional Level Self Care: Did the patient need help bathing, dressing, using the toilet or eating? Independent   Indoor Mobility: Did the patient need assistance with walking from room to room (with or without device)? Independent   Stairs: Did the patient need assistance with internal or external stairs (with or  without device)? Independent   Functional Cognition: Did the patient need help planning regular tasks such as shopping or remembering to take medications? Independent   Patient Information Are you of Hispanic, Latino/a,or Spanish origin?: A. No, not of Hispanic, Latino/a, or Spanish origin What is your race?: A. White Do you need or want an interpreter to communicate with a doctor or health care staff?: 0. No   Patient's Response To:  Health Literacy and Transportation Is the patient able to respond to health literacy and transportation needs?: Yes Health Literacy - How often do you need to have someone help you when you read instructions, pamphlets, or other written material from your doctor or pharmacy?: Never In the past 12 months, has lack of transportation kept you from medical appointments or from getting medications?: Yes In the past 12 months, has lack of transportation kept you from meetings, work, or from getting things needed for daily living?: Yes   [place] Devices/Equipment: Eyeglasses, Radio producer (specify quad or straight), Gilford Rile (specify type) Home Equipment: Environmental consultant - 2 wheels, Cane - single point, Shower seat, Bedside commode, Wheelchair - manual   Prior Device Use: Indicate devices/aids used by the patient prior to current illness, exacerbation or injury? Walker   Current Functional Level Cognition   Overall Cognitive Status: Impaired/Different from baseline Orientation Level: Oriented X4 General Comments: pt following commands, decreased recall of sternal precautions and requires cueing for problem solving    Extremity Assessment (includes Sensation/Coordination)   Upper Extremity Assessment: LUE deficits/detail LUE Deficits / Details: hx of weakness dued to CVA, grossly 3-/5 MMT with decreased coordination LUE Sensation: decreased light touch LUE Coordination: decreased fine motor, decreased gross motor  Lower Extremity  Assessment: Defer to PT evaluation     ADLs   Overall ADL's : Needs assistance/impaired Eating/Feeding: Set up, Sitting Eating/Feeding Details (indicate cue type and reason): cueing to use [name] UE as stabilizer Grooming: Minimal assistance, Sitting Upper Body Dressing : Moderate  assistance, Sitting Lower Body Dressing: Total assistance, Sit to/from stand Lower Body Dressing Details (indicate cue type and reason): requires assist for socks, min assist sit to stand Toilet Transfer: Moderate assistance, RW Toilet Transfer Details (indicate cue type and reason): mod assist to side step towards [facility] with RW Functional mobility during ADLs: Minimal assistance, Rolling walker, Cueing for safety, Cueing for sequencing General ADL Comments: pt limited by sternal precautions, [name] sided weakness from Hx of CVA and decreased activity tolerance     Mobility   Overal bed mobility: Needs Assistance Bed Mobility: Rolling, Sidelying to Sit, Sit to Supine Rolling: Min guard Sidelying to sit: Mod assist Supine to sit: Mod assist, +2 for physical assistance Sit to supine: Min assist Sit to sidelying: Mod assist General bed mobility comments: pt requires assistance to elevate trunk into sitting to maintain sternal precautions. Pt able to roll but unable to maintain rolled position, falls back     Transfers   Overall transfer level: Needs assistance Equipment used: Rolling walker (2 wheeled) Transfers: Sit to/from Stand Sit to Stand: Mod assist General transfer comment: assist to power into standing due to LE weakness     Ambulation / Gait / Stairs / Wheelchair Mobility   Ambulation/Gait Ambulation/Gait assistance: Herbalist (Feet): 15 Feet Assistive device: Rolling walker (2 wheeled) Gait Pattern/deviations: Step-to pattern General Gait Details: pt with slowed step-to gait, increased time for swing phase of LLE Gait velocity: reduced Gait velocity interpretation: <1.8 ft/sec, indicate of  risk for recurrent falls     Posture / Balance Dynamic Sitting Balance Sitting balance - Comments: limited dynamically Balance Overall balance assessment: Needs assistance Sitting-balance support: No upper extremity supported, Feet supported Sitting balance-Leahy Scale: Fair Sitting balance - Comments: limited dynamically Standing balance support: Bilateral upper extremity supported Standing balance-Leahy Scale: Poor Standing balance comment: relies on BUE and external support     Special needs/care consideration Skin sternotomy and Diabetic management yes    Previous Home Environment (from acute therapy documentation) Living Arrangements: Children Available Help at Discharge: Family, Available 24 hours/day Type of Home: House Home Layout: One level Home Access: Stairs to enter Technical brewer of Steps: 3 Bathroom Shower/Tub: Chiropodist: Parachute: Yes Type of Home Care Services: Home PT   Discharge Living Setting Plans for Discharge Living Setting: Lives with (comment) (daughter [name]) Type of Home at Discharge: House Discharge Home Layout: One level Discharge Home Access: Stairs to enter Entrance Stairs-Number of Steps: 3 Discharge Bathroom Shower/Tub: Tub/shower unit Discharge Bathroom Toilet: Standard Discharge Bathroom Accessibility: Yes How Accessible: Accessible via walker Does the patient have any problems obtaining your medications?: No   Social/Family/Support Systems Anticipated Caregiver: daughter, [name] Anticipated Caregiver's Contact Information: [phone] Ability/Limitations of Caregiver: works, but when she's gone her BF stays with pt Caregiver Availability: 24/7 Discharge Plan Discussed with Primary Caregiver: Yes Is Caregiver In Agreement with Plan?: Yes Does Caregiver/Family have Issues with Lodging/Transportation while Pt is in Rehab?: No   Goals Patient/Family Goal for Rehab: PT/OT  supervision to mod I, SLP n/a Expected length of stay: 9-12 days Additional Information: on CIR in May 2022 Pt/Family Agrees to Admission and willing to participate: Yes Program Orientation Provided & Reviewed with Pt/Caregiver Including Roles  & Responsibilities: Yes  Barriers to Discharge: Insurance for SNF coverage   Decrease burden of Care through IP rehab admission: n/a   Possible need for SNF placement upon discharge: No   Patient Condition: I have reviewed  medical records from [facility], spoken with CM, and patient. I met with patient at the bedside for inpatient rehabilitation assessment.  Patient will benefit from ongoing PT and OT, can actively participate in 3 hours of therapy a day 5 days of the week, and can make measurable gains during the admission.  Patient will also benefit from the coordinated team approach during an Inpatient Acute Rehabilitation admission.  The patient will receive intensive therapy as well as Rehabilitation physician, nursing, social worker, and care management interventions.  Due to safety, skin/wound care, disease management, medication administration, pain management, and patient education the patient requires 24 hour a day rehabilitation nursing.  The patient is currently min asisst with mobility and basic ADLs.  Discharge setting and therapy post discharge at home with home health is anticipated.  Patient has agreed to participate in the Acute Inpatient Rehabilitation Program and will admit today.   Preadmission Screen Completed By:  Michel Santee, PT, DPT 08/11/2021 11:33 AM ______________________________________________________________________   Discussed status with Dr. Ranell Patrick on 08/11/21  at .now  and received approval for admission today.   Admission Coordinator:  Michel Santee, PT, DPT time 11:33 AM Sudie Grumbling 08/11/21     Assessment/Plan: Diagnosis: Cardiac debility Does the need for close, 24 hr/day Medical supervision in concert with the  patient's rehab needs make it unreasonable for this patient to be served in a less intensive setting? Yes Co-Morbidities requiring supervision/potential complications: acute stress disorder, obesity (BMI 30.83), clinica depression, diabetes, colon diverticulosis Due to bladder management, bowel management, safety, skin/wound care, disease management, medication administration, pain management, and patient education, does the patient require 24 hr/day rehab nursing? Yes Does the patient require coordinated care of a physician, rehab nurse, PT, OT to address physical and functional deficits in the context of the above medical diagnosis(es)? Yes Addressing deficits in the following areas: balance, endurance, locomotion, strength, transferring, bowel/bladder control, bathing, dressing, feeding, grooming, toileting, and psychosocial support Can the patient actively participate in an intensive therapy program of at least 3 hrs of therapy 5 days a week? Yes The potential for patient to make measurable gains while on inpatient rehab is excellent Anticipated functional outcomes upon discharge from inpatient rehab: modified independent PT, modified independent OT, independent SLP Estimated rehab length of stay to reach the above functional goals is: 7-10 days Anticipated discharge destination: Home 10. Overall Rehab/Functional Prognosis: excellent     MD Signature: Leeroy Cha, MD

## 2021-08-11 NOTE — Plan of Care (Signed)

## 2021-08-11 NOTE — TOC Initial Note (Signed)
Transition of Care [facility]) - Initial/Assessment Note    Patient Details  Name: [name] MRN: [id number] Date of Birth: [birth year]  Transition of Care [facility]) CM/SW Contact:    Zenon Mayo, RN Phone Number: 08/11/2021, 1:32 PM  Clinical Narrative:                 DC to CIR.  Expected Discharge Plan: IP Rehab Facility Barriers to Discharge: No Barriers Identified   Patient Goals and CMS Choice Patient states their goals for this hospitalization and ongoing recovery are:: cIR      Expected Discharge Plan and Services Expected Discharge Plan: [place]   Discharge Planning Services: CM Consult   Living arrangements for the past 2 months: Single Family Home Expected Discharge Date: 08/11/21                                    Prior Living Arrangements/Services Living arrangements for the past 2 months: Single Family Home   Patient language and need for interpreter reviewed:: Yes Do you feel safe going back to the place where you live?: Yes      Need for Family Participation in Patient Care: Yes (Comment) Care giver support system in place?: Yes (comment)   Criminal Activity/Legal Involvement Pertinent to Current Situation/Hospitalization: No - Comment as needed  Activities of Daily Living Home Assistive Devices/Equipment: Eyeglasses, Cane (specify quad or straight), Walker (specify type) ADL Screening (condition at time of admission) Patient's cognitive ability adequate to safely complete daily activities?: Yes Is the patient deaf or have difficulty hearing?: No Does the patient have difficulty seeing, even when wearing glasses/contacts?: Yes Does the patient have difficulty concentrating, remembering, or making decisions?: No Patient able to express need for assistance with ADLs?: Yes Does the patient have difficulty dressing or bathing?: No Independently performs ADLs?: Yes (appropriate for developmental age) Does the patient have  difficulty walking or climbing stairs?: Yes Weakness of Legs: Left Weakness of Arms/Hands: Left  Permission Sought/Granted                  Emotional Assessment Appearance:: Appears older than stated age     Orientation: : Oriented to Self, Oriented to Situation, Oriented to  Time, Oriented to Place Alcohol / Substance Use: Not Applicable Psych Involvement: No (comment)  Admission diagnosis:  S/P CABG x 4 [Z95.1] Patient Active Problem List   Diagnosis Date Noted   S/P CABG x 4 08/04/2021   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   Hyperkalemia    Chronic bilateral low back pain without sciatica    Leukocytosis    Hyponatremia    Essential hypertension    Slow transit constipation    Hemiparesis affecting left side as late effect of stroke ([place])    Aortic valve mass    History of CVA with residual deficit 04/19/2021   Dyslipidemia, goal LDL below 70 04/18/2021   Hypertensive urgency 04/18/2021   Recurrent major depressive disorder, in partial remission ([place]) 09/20/2020   Status post total replacement of hip 04/04/2018   Primary osteoarthritis of right hip 02/20/2018   Allergic rhinitis 10/28/2015   Acute stress disorder 08/01/2015   Anxiety 08/01/2015   Clinical depression 08/01/2015   Diabetes ([place]) 08/01/2015   Diverticulosis of colon 08/01/2015   Generalized pruritus 08/01/2015   BP (high blood pressure) 08/01/2015   Cannot sleep 08/01/2015   Adiposity 08/01/2015   Detrusor muscle  hypertonia 08/01/2015   Hypercholesterolemia without hypertriglyceridemia 08/01/2015   Female stress incontinence 08/01/2015   Avitaminosis D 08/01/2015   PCP:  Birdie Sons, MD Pharmacy:   [facility] [address] [place] [address] Phone: [phone] Fax: [place] [phone] - [address] [place] [address] Phone: [phone] Fax:  [phone]     Social Determinants of Health (SDOH) Interventions    Readmission Risk Interventions Readmission Risk Prevention Plan 08/11/2021  Transportation Screening Complete  HRI or Home Care Consult Complete  Social Work Consult for [place] Planning/Counseling Complete  Palliative Care Screening Not Applicable  Medication Review Press photographer) Complete  Some recent data might be hidden

## 2021-08-11 NOTE — Progress Notes (Signed)
Inpatient Rehabilitation  Patient information reviewed and entered into eRehab system by [place] Deleo M. Caston Coopersmith, M.A., CCC/SLP, PPS Coordinator.  Information including medical coding, functional ability and quality indicators will be reviewed and updated through discharge.    

## 2021-08-11 NOTE — Progress Notes (Addendum)
      [address]       [place] [address]             [phone]        7 Days Post-Op Procedure(s) (LRB): RESECTION AORTIC VALVE TUMOR (N/A) CORONARY ARTERY BYPASS GRAFTING (CABG) X  4  USING LEFT INTERNAL MAMMARY ARTERY AND RIGHT GREATER SAPHENOUS VEIN ENDOSCOPIC CONDUITS (N/A) TRANSESOPHAGEAL ECHOCARDIOGRAM (TEE) (N/A) APPLICATION OF CELL SAVER  Subjective: Patient slept well. She walked 2 times yesterday. She has no complaint this am.  Objective: Vital signs in last 24 hours: Temp:  [97.2 F (36.2 C)-98.6 F (37 C)] 97.2 F (36.2 C) (08/29 0428) Pulse Rate:  [74-84] 74 (08/29 0428) Cardiac Rhythm: Normal sinus rhythm (08/28 2319) Resp:  [15-19] 15 (08/29 0428) BP: (111-151)/(55-84) 126/55 (08/29 0428) SpO2:  [92 %-95 %] 95 % (08/29 0428) Weight:  [89.3 kg] 89.3 kg (08/29 0428)  Pre op weight  87.5 kg Current Weight  08/11/21 89.3 kg      Intake/Output from previous day: 08/28 0701 - 08/29 0700 In: 610 [P.O.:600; I.V.:10] Out: 275 [Urine:275]   Physical Exam:  Cardiovascular: RRR Pulmonary: Slightly diminished left basilar breath sound Abdomen: Soft, non tender, bowel sounds present. Extremities: Trace bilateral lower extremity edema. Mild right thigh ecchymosis/bruising Wounds: Clean and dry.  No erythema or signs of infection.  Lab Results: CBC: Recent Labs    08/10/21 0035  WBC 10.7*  HGB 10.4*  HCT 31.6*  PLT 322   BMET:  Recent Labs    08/10/21 0035  NA 135  K 3.7  CL 89*  CO2 31  GLUCOSE 156*  BUN 9  CREATININE 0.72  CALCIUM 8.8*    PT/INR:  Lab Results  Component Value Date   INR 1.5 (H) 08/04/2021   INR 0.9 07/31/2021   INR 1.0 07/15/2021   ABG:  INR: Will add last result for INR, ABG once components are confirmed Will add last 4 CBG results once components are confirmed  Assessment/Plan:  1. CV - SR, first degree heart block. On Amlodipine 10 mg day, Hydralazine 10 mg at hs, Losartan 100 mg daily,  Lopressor 12.5 mg bid 2.  Pulmonary - On room air. Encourage incentive spirometer 3. Volume Overload - Will give Lasix today  4.  Expected post op acute blood loss anemia - Last H and H stable at 10.4 and 31.6 5. DM-CBGs 152/162/136. Pre op HGA1C 7.8. On Insulin. Will restart Metformin and Glyburide at discharge. 6. To CIR when bed available  Ivis Henneman M ZimmermanPA-C 08/11/2021,7:20 AM

## 2021-08-11 NOTE — Progress Notes (Signed)
Patient BP running soft tonight, asymptomatic. Referred to On-call. [name] ordered to hold Hydralazine, decrease Amlodipine dose to '5mg'$  and give full dose of Metoprolol tonight.  Orders carried out.

## 2021-08-11 NOTE — TOC Transition Note (Signed)
Transition of Care [facility]) - CM/SW Discharge Note   Patient Details  Name: RHENDA MCNELIS MRN: [id number] Date of Birth: 1956/02/04  Transition of Care [facility]) CM/SW Contact:  Zenon Mayo, RN Phone Number: 08/11/2021, 1:32 PM   Clinical Narrative:    DC to CIR.   Final next level of care: IP Rehab Facility Barriers to Discharge: No Barriers Identified   Patient Goals and CMS Choice Patient states their goals for this hospitalization and ongoing recovery are:: cIR      Discharge Placement                       Discharge Plan and Services   Discharge Planning Services: CM Consult                                 Social Determinants of Health (SDOH) Interventions     Readmission Risk Interventions Readmission Risk Prevention Plan 08/11/2021  Transportation Screening Complete  HRI or [place] Complete  Social Work Consult for [place] Planning/Counseling Complete  Palliative Care Screening Not Applicable  Medication Review Press photographer) Complete  Some recent data might be hidden

## 2021-08-11 NOTE — Progress Notes (Signed)
Orthopedic Tech Progress Note Patient Details:  [name] Oct 16, 1956 [id number]  Called in order to [employer] for a RESTING HAND SPLINT   Patient ID: DOLOROS IGOE, female   DOB: December 21, 1955, 65 y.o.   MRN: [id number]  Janit Pagan 08/11/2021, 3:10 PM

## 2021-08-11 NOTE — Progress Notes (Signed)
2 PIV's removed without complication, 2 sutures removed from chest tube sites. Patient tolerated well.

## 2021-08-11 NOTE — PMR Pre-admission (Signed)
PMR Admission Coordinator Pre-Admission Assessment  Patient: [name] is an 65 y.o., female MRN: [id number] DOB: Aug 11, 1956 Height: 5' 7"  (170.2 cm) Weight: 89.3 kg  Insurance Information HMO:     PPO:      PCP:      IPA:      80/20: yes     OTHER:  PRIMARYIval Bible      Policy#: [id number]      Subscriber: pt CM Name: Marzetta Board      Phone#: [phone]     Fax#: [phone] Pre-Cert#: [id number] Josem Kaufmann for CIR provided by Marzetta Board with [employer].  Updates due to fax listed above on 9/5.       Employer: [employer] Benefits:  Phone #: [phone]     Name:  Eff. Date: 12/14/16     Deduct: $300 (met)      Out of Pocket Max: $7900 (met)      Life Max: n/a CIR: 80%      SNF: 80% Outpatient: 80%     Co-Ins: 20% Home Health: 80%      Co-Ins: 20% DME: 80%     Co-Ins: 20% Providers:  SECONDARY: Medicare Part A      Policy#: [id number]     Phone#:   Financial Counselor:       Phone#:   The Therapist, art Information Summary" for patients in Inpatient Rehabilitation Facilities with attached "Privacy Act [place] Records" was provided and verbally reviewed with: Patient and Family  Emergency Contact Information Contact Information     Name Relation Home Work Mobile   [place] L Daughter [phone]     Tressia Danas [phone]         Current Medical History  Patient Admitting Diagnosis: cardiac debility  History of Present Illness: [name] is a 65 year old right-handed female with history of diabetes mellitus hypertension hyperlipidemia remote tobacco use obesity with BMI 30.83, anxiety/depression, as well as right thalamic infarction receiving CIR 04/29/2021 to 05/27/2021.  She was discharged home ambulating 200 feet contact-guard assist.  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  Presented 08/04/2021 with plan to complete workup.  She had a coronary CT which showed significant stenosis in  all the coronaries with definite impairment of flow in the LAD and diagonal distribution and due to these findings patient underwent median sternotomy resection of fibroelastoma from aortic valve and CABG x4 08/04/2021 per Dr. Roxan Hockey.  Sternal precautions as indicated.  Hospital course acute blood loss anemia 7.7, transfused, and latest hemoglobin 10.4.  She currently remains on aspirin for history of CVA as well as CABG.  Subcutaneous Lovenox initiated for DVT prophylaxis.  Tolerating a regular diet.  Therapy evaluations completed due to patient decreased functional mobility was recommended for a comprehensive rehab program.      Patient's medical record from [facility] has been reviewed by the rehabilitation admission coordinator and physician.  Past Medical History  Past Medical History:  Diagnosis Date   Anxiety    Arthritis    Cataract    left   Depression    Diabetes mellitus without complication (HCC)    Environmental and seasonal allergies    GERD (gastroesophageal reflux disease)    Hyperlipidemia    Hypertension    Joint pain    as reported by patient   Right thalamic infarction ([place]) 04/29/2021   Stroke ([place]) 04/2021   admitted at [facility]   Urinary incontinence  as stated by patient    Has the patient had major surgery during 100 days prior to admission? Yes  Family History   family history includes Alzheimer's disease in her paternal grandmother; Aneurysm in her mother; Bone cancer in her brother; CAD in her father; Cancer in her father; Colon cancer in her father; Dementia in her paternal grandmother; Diabetes in her father; Healthy in her daughter and son; Heart failure in her father; Mental illness in her paternal grandfather.  Current Medications  Current Facility-Administered Medications:    0.9 %  sodium chloride infusion, 250 mL, Intravenous, PRN, [name], MD   ALPRAZolam Duanne Moron) tablet 0.5 mg, 0.5 mg, Oral, BID PRN, [name], MD   amLODipine (NORVASC) tablet 10 mg, 10 mg, Oral, QHS, [name], MD, 10 mg at 08/10/21 2204   aspirin EC tablet 325 mg, 325 mg, Oral, Daily, 325 mg at 08/10/21 0954 **OR** aspirin chewable tablet 324 mg, 324 mg, Per Tube, Daily, [name], MD, 324 mg at 08/11/21 0905   atorvastatin (LIPITOR) tablet 40 mg, 40 mg, Oral, Daily, [name], MD, 40 mg at 08/11/21 6144   bisacodyl (DULCOLAX) EC tablet 10 mg, 10 mg, Oral, Daily, 10 mg at 08/11/21 0907 **OR** bisacodyl (DULCOLAX) suppository 10 mg, 10 mg, Rectal, Daily, [name], MD   enoxaparin (LOVENOX) injection 40 mg, 40 mg, Subcutaneous, QHS, [name], MD, 40 mg at 08/10/21 2204   furosemide (LASIX) tablet 40 mg, 40 mg, Oral, Daily, Lars Pinks M, PA-C, 40 mg at 08/11/21 3154   gabapentin (NEURONTIN) capsule 300 mg, 300 mg, Oral, QHS, [name], MD, 300 mg at 08/10/21 2204   hydrALAZINE (APRESOLINE) tablet 10 mg, 10 mg, Oral, QHS, [name], MD, 10 mg at 08/10/21 2204   insulin aspart (novoLOG) injection 0-15 Units, 0-15 Units, Subcutaneous, TID WC, [name], MD, 2 Units at 08/10/21 0086   losartan (COZAAR) tablet 100 mg, 100 mg, Oral, Daily, Gold, Wayne E, PA-C, 100 mg at 08/11/21 7619   magnesium hydroxide (MILK OF MAGNESIA) suspension 30 mL, 30 mL, Oral, Daily PRN, [name], MD   magnesium oxide (MAG-OX) tablet 400 mg, 400 mg, Oral, BID, Gold, Wayne E, PA-C, 400 mg at 08/11/21 5093   methocarbamol (ROBAXIN) tablet 500 mg, 500 mg, Oral, BID PRN, [name], MD   metoprolol tartrate (LOPRESSOR) tablet 12.5 mg, 12.5 mg, Oral, BID, 12.5 mg at 08/11/21 0906 **OR** metoprolol tartrate (LOPRESSOR) 25 mg/10 mL oral suspension 12.5 mg, 12.5 mg, Per Tube, BID, [name], MD   ondansetron [facility]) injection 4 mg, 4 mg, Intravenous, Q6H PRN, [name], MD, 4 mg at 08/10/21 1210   pantoprazole (PROTONIX)  EC tablet 40 mg, 40 mg, Oral, QHS, [name], MD, 40 mg at 08/10/21 2204   potassium chloride SA (KLOR-CON) CR tablet 20 mEq, 20 mEq, Oral, Daily, Lars Pinks M, PA-C, 20 mEq at 08/11/21 2671   rOPINIRole (REQUIP) tablet 0.5 mg, 0.5 mg, Oral, QHS, [name], MD, 0.5 mg at 08/10/21 2204   sertraline (ZOLOFT) tablet 100 mg, 100 mg, Oral, Daily, [name], MD, 100 mg at 08/11/21 2458   sodium chloride flush (NS) 0.9 % injection 3 mL, 3 mL, Intravenous, Q12H, [name], MD, 3 mL at 08/11/21 0908   sodium chloride flush (NS) 0.9 % injection 3 mL, 3 mL, Intravenous, PRN, [name], MD   traMADol Veatrice Bourbon) tablet 50  mg, 50 mg, Oral, Q6H PRN, [name], MD, 50 mg at 08/10/21 2204  Patients Current Diet:  Diet Order             Diet heart healthy/carb modified Room service appropriate? Yes with Assist; Fluid consistency: Thin  Diet effective now                   Precautions / Restrictions Precautions Precautions: Fall, Sternal Precaution Booklet Issued: Yes (comment) Precaution Comments: pt requires cues to avoid shoulder horizontal abduction Restrictions Weight Bearing Restrictions: Yes (sternal precautions) Other Position/Activity Restrictions: sternal precautions   Has the patient had 2 or more falls or a fall with injury in the past year? No  Prior Activity Level Limited Community (1-2x/wk): driving and independent PTA, was on CIR in May 22 for a CVA, has returned to independence since, daughter assists with meds/cooking  Prior Functional Level Self Care: Did the patient need help bathing, dressing, using the toilet or eating? Independent  Indoor Mobility: Did the patient need assistance with walking from room to room (with or without device)? Independent  Stairs: Did the patient need assistance with internal or external stairs (with or without device)? Independent  Functional Cognition: Did the  patient need help planning regular tasks such as shopping or remembering to take medications? Independent  Patient Information Are you of Hispanic, Latino/a,or Spanish origin?: A. No, not of Hispanic, Latino/a, or Spanish origin What is your race?: A. White Do you need or want an interpreter to communicate with a doctor or health care staff?: 0. No  Patient's Response To:  Health Literacy and Transportation Is the patient able to respond to health literacy and transportation needs?: Yes Health Literacy - How often do you need to have someone help you when you read instructions, pamphlets, or other written material from your doctor or pharmacy?: Never In the past 12 months, has lack of transportation kept you from medical appointments or from getting medications?: Yes In the past 12 months, has lack of transportation kept you from meetings, work, or from getting things needed for daily living?: Yes  [place] Devices/Equipment: Eyeglasses, Radio producer (specify quad or straight), Gilford Rile (specify type) Home Equipment: Environmental consultant - 2 wheels, Cane - single point, Shower seat, Bedside commode, Wheelchair - manual  Prior Device Use: Indicate devices/aids used by the patient prior to current illness, exacerbation or injury? Walker  Current Functional Level Cognition  Overall Cognitive Status: Impaired/Different from baseline Orientation Level: Oriented X4 General Comments: pt following commands, decreased recall of sternal precautions and requires cueing for problem solving    Extremity Assessment (includes Sensation/Coordination)  Upper Extremity Assessment: LUE deficits/detail LUE Deficits / Details: hx of weakness dued to CVA, grossly 3-/5 MMT with decreased coordination LUE Sensation: decreased light touch LUE Coordination: decreased fine motor, decreased gross motor  Lower Extremity Assessment: Defer to PT evaluation    ADLs  Overall ADL's : Needs  assistance/impaired Eating/Feeding: Set up, Sitting Eating/Feeding Details (indicate cue type and reason): cueing to use L UE as stabilizer Grooming: Minimal assistance, Sitting Upper Body Dressing : Moderate assistance, Sitting Lower Body Dressing: Total assistance, Sit to/from stand Lower Body Dressing Details (indicate cue type and reason): requires assist for socks, min assist sit to stand Toilet Transfer: Moderate assistance, RW Toilet Transfer Details (indicate cue type and reason): mod assist to side step towards [facility] with RW Functional mobility during ADLs: Minimal assistance, Rolling walker, Cueing for safety, Cueing for sequencing  General ADL Comments: pt limited by sternal precautions, L sided weakness from Hx of CVA and decreased activity tolerance    Mobility  Overal bed mobility: Needs Assistance Bed Mobility: Rolling, Sidelying to Sit, Sit to Supine Rolling: Min guard Sidelying to sit: Mod assist Supine to sit: Mod assist, +2 for physical assistance Sit to supine: Min assist Sit to sidelying: Mod assist General bed mobility comments: pt requires assistance to elevate trunk into sitting to maintain sternal precautions. Pt able to roll but unable to maintain rolled position, falls back    Transfers  Overall transfer level: Needs assistance Equipment used: Rolling walker (2 wheeled) Transfers: Sit to/from Stand Sit to Stand: Mod assist General transfer comment: assist to power into standing due to LE weakness    Ambulation / Gait / Stairs / Wheelchair Mobility  Ambulation/Gait Ambulation/Gait assistance: Herbalist (Feet): 15 Feet Assistive device: Rolling walker (2 wheeled) Gait Pattern/deviations: Step-to pattern General Gait Details: pt with slowed step-to gait, increased time for swing phase of LLE Gait velocity: reduced Gait velocity interpretation: <1.8 ft/sec, indicate of risk for recurrent falls    Posture / Balance Dynamic Sitting  Balance Sitting balance - Comments: limited dynamically Balance Overall balance assessment: Needs assistance Sitting-balance support: No upper extremity supported, Feet supported Sitting balance-Leahy Scale: Fair Sitting balance - Comments: limited dynamically Standing balance support: Bilateral upper extremity supported Standing balance-Leahy Scale: Poor Standing balance comment: relies on BUE and external support    Special needs/care consideration Skin sternotomy and Diabetic management yes   Previous Home Environment (from acute therapy documentation) Living Arrangements: Children Available Help at Discharge: Family, Available 24 hours/day Type of Home: House Home Layout: One level Home Access: Stairs to enter Technical brewer of Steps: 3 Bathroom Shower/Tub: Chiropodist: [place]: Yes Type of Home Care Services: Home PT  Discharge Living Setting Plans for Discharge Living Setting: Lives with (comment) (daughter Caylee Vlachos) Type of Home at Discharge: House Discharge Home Layout: One level Discharge Home Access: Stairs to enter Entrance Stairs-Number of Steps: 3 Discharge Bathroom Shower/Tub: Tub/shower unit Discharge Bathroom Toilet: Standard Discharge Bathroom Accessibility: Yes How Accessible: Accessible via walker Does the patient have any problems obtaining your medications?: No  Social/Family/Support Systems Anticipated Caregiver: daughter, Azarria Balint Anticipated Caregiver's Contact Information: [phone] Ability/Limitations of Caregiver: works, but when she's gone her BF stays with pt Caregiver Availability: 24/7 Discharge Plan Discussed with Primary Caregiver: Yes Is Caregiver In Agreement with Plan?: Yes Does Caregiver/Family have Issues with Lodging/Transportation while Pt is in Rehab?: No  Goals Patient/Family Goal for Rehab: PT/OT supervision to mod I, SLP n/a Expected length of stay: 9-12  days Additional Information: on CIR in May 2022 Pt/Family Agrees to Admission and willing to participate: Yes Program Orientation Provided & Reviewed with Pt/Caregiver Including Roles  & Responsibilities: Yes  Barriers to Discharge: Insurance for SNF coverage  Decrease burden of Care through IP rehab admission: n/a  Possible need for SNF placement upon discharge: No  Patient Condition: I have reviewed medical records from [facility], spoken with CM, and patient. I met with patient at the bedside for inpatient rehabilitation assessment.  Patient will benefit from ongoing PT and OT, can actively participate in 3 hours of therapy a day 5 days of the week, and can make measurable gains during the admission.  Patient will also benefit from the coordinated team approach during an Inpatient Acute Rehabilitation admission.  The patient will receive intensive therapy as well as  Rehabilitation physician, nursing, social worker, and care management interventions.  Due to safety, skin/wound care, disease management, medication administration, pain management, and patient education the patient requires 24 hour a day rehabilitation nursing.  The patient is currently min asisst with mobility and basic ADLs.  Discharge setting and therapy post discharge at home with home health is anticipated.  Patient has agreed to participate in the Acute Inpatient Rehabilitation Program and will admit today.  Preadmission Screen Completed By:  Michel Santee, PT, DPT 08/11/2021 11:33 AM ______________________________________________________________________   Discussed status with Dr. Ranell Patrick on 08/11/21  at .now  and received approval for admission today.  Admission Coordinator:  Michel Santee, PT, DPT time 11:33 AM Sudie Grumbling 08/11/21    Assessment/Plan: Diagnosis: Cardiac debility Does the need for close, 24 hr/day Medical supervision in concert with the patient's rehab needs make it unreasonable for this patient to be  served in a less intensive setting? Yes Co-Morbidities requiring supervision/potential complications: acute stress disorder, obesity (BMI 30.83), clinica depression, diabetes, colon diverticulosis Due to bladder management, bowel management, safety, skin/wound care, disease management, medication administration, pain management, and patient education, does the patient require 24 hr/day rehab nursing? Yes Does the patient require coordinated care of a physician, rehab nurse, PT, OT to address physical and functional deficits in the context of the above medical diagnosis(es)? Yes Addressing deficits in the following areas: balance, endurance, locomotion, strength, transferring, bowel/bladder control, bathing, dressing, feeding, grooming, toileting, and psychosocial support Can the patient actively participate in an intensive therapy program of at least 3 hrs of therapy 5 days a week? Yes The potential for patient to make measurable gains while on inpatient rehab is excellent Anticipated functional outcomes upon discharge from inpatient rehab: modified independent PT, modified independent OT, independent SLP Estimated rehab length of stay to reach the above functional goals is: 7-10 days Anticipated discharge destination: Home 10. Overall Rehab/Functional Prognosis: excellent   MD Signature: Leeroy Cha, MD

## 2021-08-11 NOTE — Progress Notes (Signed)
[id number] Gave pt diabetic and heart healthy diets for reference. Encouraged IS. Reviewed sternal precautions and staying in the tube. Did not give ex ed as going to CIR and PT can address when closer to d/c. Discussed CRP 2 Bourbon for long term goal. Pt interested as she works at [employer]. Will send letter of interest. Graylon Good RN BSN 08/11/2021 11:44 AM

## 2021-08-11 NOTE — Progress Notes (Signed)
Inpatient Rehab Admissions Coordinator:   I have a bed available for pt to admit today and per primary team she is ready.  I will let pt/family and TOC team know.   Shann Medal, PT, DPT Admissions Coordinator [phone] 08/11/21  10:53 AM

## 2021-08-12 DIAGNOSIS — R5381 Other malaise: Secondary | ICD-10-CM | POA: Diagnosis not present

## 2021-08-12 LAB — GLUCOSE, CAPILLARY
Glucose-Capillary: 163 mg/dL — ABNORMAL HIGH (ref 70–99)
Glucose-Capillary: 151 mg/dL — ABNORMAL HIGH (ref 70–99)
Glucose-Capillary: 97 mg/dL (ref 70–99)
Glucose-Capillary: 114 mg/dL — ABNORMAL HIGH (ref 70–99)

## 2021-08-12 LAB — COMPREHENSIVE METABOLIC PANEL
ALT: 17 U/L (ref 0–44)
AST: 27 U/L (ref 15–41)
Albumin: 3.2 g/dL — ABNORMAL LOW (ref 3.5–5.0)
Alkaline Phosphatase: 56 U/L (ref 38–126)
Anion gap: 10 (ref 5–15)
BUN: 12 mg/dL (ref 8–23)
CO2: 31 mmol/L (ref 22–32)
Calcium: 8.6 mg/dL — ABNORMAL LOW (ref 8.9–10.3)
Chloride: 90 mmol/L — ABNORMAL LOW (ref 98–111)
Creatinine, Ser: 1.02 mg/dL — ABNORMAL HIGH (ref 0.44–1.00)
GFR, Estimated: >60 mL/min (ref >60)
Glucose, Bld: 200 mg/dL — ABNORMAL HIGH (ref 70–99)
Potassium: 4.2 mmol/L (ref 3.5–5.1)
Sodium: 131 mmol/L — ABNORMAL LOW (ref 135–145)
Total Bilirubin: 1 mg/dL (ref 0.3–1.2)
Total Protein: 6.7 g/dL (ref 6.5–8.1)

## 2021-08-12 LAB — CBC WITH DIFFERENTIAL/PLATELET
Abs Immature Granulocytes: 0.11 10*3/uL — ABNORMAL HIGH (ref 0.00–0.07)
Basophils Absolute: 0.1 10*3/uL (ref 0.0–0.1)
Basophils Relative: 1 %
Eosinophils Absolute: 0.5 10*3/uL (ref 0.0–0.5)
Eosinophils Relative: 4 %
HCT: 31.4 % — ABNORMAL LOW (ref 36.0–46.0)
Hemoglobin: 10.4 g/dL — ABNORMAL LOW (ref 12.0–15.0)
Immature Granulocytes: 1 %
Lymphocytes Relative: 17 %
Lymphs Abs: 1.9 10*3/uL (ref 0.7–4.0)
MCH: 28.4 pg (ref 26.0–34.0)
MCHC: 33.1 g/dL (ref 30.0–36.0)
MCV: 85.8 fL (ref 80.0–100.0)
Monocytes Absolute: 0.7 10*3/uL (ref 0.1–1.0)
Monocytes Relative: 6 %
Neutro Abs: 7.8 10*3/uL — ABNORMAL HIGH (ref 1.7–7.7)
Neutrophils Relative %: 71 %
Platelets: 380 10*3/uL (ref 150–400)
RBC: 3.66 MIL/uL — ABNORMAL LOW (ref 3.87–5.11)
RDW: 13.1 % (ref 11.5–15.5)
WBC: 11.1 10*3/uL — ABNORMAL HIGH (ref 4.0–10.5)
nRBC: 0 % (ref 0.0–0.2)

## 2021-08-12 LAB — VITAMIN D 25 HYDROXY (VIT D DEFICIENCY, FRACTURES): Vit D, 25-Hydroxy: 26.76 ng/mL — ABNORMAL LOW (ref 30–100)

## 2021-08-12 LAB — MAGNESIUM: Magnesium: 1.7 mg/dL (ref 1.7–2.4)

## 2021-08-12 MED ORDER — MAGNESIUM GLUCONATE 500 MG PO TABS
250.0000 mg | ORAL_TABLET | Freq: Every day | ORAL | Status: DC
  Administered 2021-08-12 – 2021-08-22 (×11): 250 mg via ORAL
  Filled 2021-08-12 (×12): qty 1

## 2021-08-12 MED ORDER — VITAMIN D (ERGOCALCIFEROL) 1.25 MG (50000 UNIT) PO CAPS
50000.0000 [IU] | ORAL_CAPSULE | ORAL | Status: DC
  Administered 2021-08-12 – 2021-09-09 (×5): 50000 [IU] via ORAL
  Filled 2021-08-12 (×6): qty 1

## 2021-08-12 NOTE — Progress Notes (Signed)
Occupational Therapy Session Note  Patient Details  Name: [name] MRN: [id number] Date of Birth: [birth year]  Today's Date: 08/12/2021 OT Individual Time: [id number] OT Individual Time Calculation (min): 20 min (missed 10 mins)    Short Term Goals: Week 1:  OT Short Term Goal 1 (Week 1): Pt will complete sit > stand with CGA while maintaining sternal precautions OT Short Term Goal 2 (Week 1): Pt will complete LB dressing with min assist while adhering ot sternal precautions OT Short Term Goal 3 (Week 1): Pt will complete toileting (clothing management and hygiene) with CGA while adhering to sternal precautions  Skilled Therapeutic Interventions/Progress Updates:    Treatment session limited by fatigue.  Pt received supine in bed reporting having not eaten and wanting to attempt to eat while sitting EOB.  Therapist assisted pt in sidelying to sitting with mod assist to ensure that pt maintained sternal precautions when completing bed mobility.  Pt eating a small portion of her lunch while discussing pt/therapy goals.  Pt reports fatigue and then requesting to return to supine.  Pt able to return to supine with CGA this session.  Pt remained semi-reclined and left with all needs in reach.  Therapy Documentation Precautions:  Precautions Precautions: Fall, Sternal Restrictions Weight Bearing Restrictions: Yes (sternal precautions) Other Position/Activity Restrictions: sternal precautions General: General OT Amount of Missed Time: 10 Minutes PT Missed Treatment Reason: Patient fatigue Vital Signs: Therapy Vitals Temp: 98 F (36.7 C) Temp Source: Oral Pulse Rate: 76 Resp: 17 BP: 101/64 Patient Position (if appropriate): Lying Oxygen Therapy SpO2: 93 % O2 Device: Room Air Pain: Pt with no c/o pain   Therapy/Group: Individual Therapy  Simonne Come 08/12/2021, 3:04 PM

## 2021-08-12 NOTE — Discharge Instructions (Addendum)
Inpatient Rehab Discharge Instructions  [name] Discharge date and time: No discharge date for patient encounter.   Activities/Precautions/ Functional Status: Activity:  Sternal precautions Diet: diabetic diet Wound Care: Routine skin checks Functional status:  ___ No restrictions     ___ Walk up steps independently ___ 24/7 supervision/assistance   ___ Walk up steps with assistance ___ Intermittent supervision/assistance  ___ Bathe/dress independently ___ Walk with walker     _x__ Bathe/dress with assistance ___ Walk Independently    ___ Shower independently ___ Walk with assistance    ___ Shower with assistance ___ No alcohol     ___ Return to work/school ________  Special Instructions: No driving smoking or alcohol   Moistened saline gauze covered with dry gauze and secured with ABD pad and Tegaderm to allow patient to shower change twice daily   COMMUNITY REFERRALS UPON DISCHARGE:    Home Health:   PT, OT, RN                  Agency:[facility] [phone]   Medical Equipment/Items Ordered: HAS EQUIPMENT FROM LAST ADMIT                                                 Agency/Supplier:NA    My questions have been answered and I understand these instructions. I will adhere to these goals and the provided educational materials after my discharge from the hospital.  Patient/Caregiver Signature _______________________________ Date __________  Clinician Signature _______________________________________ Date __________  Please bring this form and your medication list with you to all your follow-up doctor's appointments.

## 2021-08-12 NOTE — Progress Notes (Signed)
Patient has not passed urine since 1900H, she said that is her normal. Encouraged to sit on bedpan but no output, she refused to be catheterized and said she will try bedpan again later. Excoriation of skin evident on bilateral groin and labia, cleansed and barrier cream applied.

## 2021-08-12 NOTE — Patient Care Conference (Signed)
Inpatient RehabilitationTeam Conference and Plan of Care Update Date: 08/12/2021   Time: 13:38 PM    Patient Name: [name]      Medical Record Number: [id number]  Date of Birth: 05/07/1956 Sex: Female         Room/Bed: 5C07C/5C07C-01 Payor Info: Payor: [employer] / Plan: [employer] / Product Type: *No Product type* /    Admit Date/Time:  08/11/2021  3:14 PM  Primary Diagnosis:  [facility]: Principal Problem:   Debility    Expected Discharge Date: Expected Discharge Date: 08/26/21  Team Members Present: Physician leading conference: Dr. Leeroy Cha Social Worker Present: Ovidio Kin, LCSW Nurse Present: Dorien Chihuahua, RN PT Present: [name] Sax, PT OT Present: Simonne Come, OT     Current Status/Progress Goal Weekly Team Focus  Bowel/Bladder             Swallow/Nutrition/ Hydration             ADL's   CGA UB bathing/dressing with cues for sternal precautions during UB dressing, Mod-Max A LB dressing due to sternal precautions with decreased ability to complete sit > stand, Mod A stand pivot transfer/toilet transfers  Supervision  ADL retraining, sternal precautions during self-care/functional tasks, transfers, d/c planning   Mobility   bed mobility min/mod, transfers with RW mod A, gait 28f with RW mod A  supervision  functional mobility/transfers, generalized strengthening, dynamic standing balance/coordination, ambulation, stair navigation, adherance to precautions, D/C planning.   Communication             Safety/Cognition/ Behavioral Observations            Pain             Skin               Discharge Planning:  Reently here in 04/2021, will return to daughter's home at discharge, daughter's boyfriend is also there. Will have 24/7 supervision at DC   Team Discussion: Left hand pain worse than CABG site. Incision with skin glue; healing. MD adjusted medications and added supplements for magnesium and Vitamin D.  Monitoring AKI; encouraging increased fluids. Also note urinary retention but patient reluctant to intermittent cath due to discomfort. Continent of bowel. Patient is anxious to go home but declines assistance as she will not have help with toileting at discharge.  Patient on target to meet rehab goals: yes, currently max assist to stand with left knee buckling and leaning. Min - mod assist for bed mobility due to sternal precautions. Patient able to quote precautions however cannot safely follow precautions. Noted dizziness with toileting. Requires mod assist for sit - stand and stand pivot transfers. Required max assist for lower body dressing with precautions. Goals for discharge set at supervision overall.  *See Care Plan and progress notes for long and short-term goals.   Revisions to Treatment Plan:     Teaching Needs: Skin care, medications, safety - WB precautions, etc,  Current Barriers to Discharge: Decreased caregiver support, Home enviroment access/layout, and Weight bearing restrictions  Possible Resolutions to Barriers: Family education with daughter     Medical Summary Current Status: exhausted from insomnia, severe left hand pain, obesity (BMI 30.87), knee buckling, vitamin D and magnesium deficiency, AKI, urinary retention  Barriers to Discharge: Medical stability;Wound care  Barriers to Discharge Comments: exhausted from insomnia, severe left hand pain, obesity (BMI 30.87), knee buckling, vitamin D and magnesium deficiency, AKI, urinary retention Possible Resolutions to [employer]Focus: increase gabapentin  HS to '300mg'$ , add cymbalta '20mg'$  daily, continue dietary education, supplement vitamin D and magnesium, encourage 6-8 glasses of water per day   Continued Need for Acute Rehabilitation Level of Care: The patient requires daily medical management by a physician with specialized training in physical medicine and rehabilitation for the following reasons: Direction of a  multidisciplinary physical rehabilitation program to maximize functional independence : Yes Medical management of patient stability for increased activity during participation in an intensive rehabilitation regime.: Yes Analysis of laboratory values and/or radiology reports with any subsequent need for medication adjustment and/or medical intervention. : Yes   I attest that I was present, lead the team conference, and concur with the assessment and plan of the team.   Dorien Chihuahua B 08/12/2021, 1:59 PM

## 2021-08-12 NOTE — Evaluation (Signed)
Occupational Therapy Assessment and Plan  Patient Details  Name: [name] MRN: [id number] Date of Birth: 03-Aug-1956  OT Diagnosis: acute pain, hemiplegia affecting non-dominant side, and muscle weakness (generalized) Rehab Potential: Rehab Potential (ACUTE ONLY): Good ELOS: 7-10 days   Today's Date: 08/12/2021 OT Individual Time: 0630-1601 OT Individual Time Calculation (min): 60 min     Hospital Problem: Principal Problem:   Debility   Past Medical History:  Past Medical History:  Diagnosis Date   Anxiety    Arthritis    Cataract    left   Depression    Diabetes mellitus without complication ([place])    Environmental and seasonal allergies    GERD (gastroesophageal reflux disease)    Hyperlipidemia    Hypertension    Joint pain    as reported by patient   Right thalamic infarction ([place]) 04/29/2021   Stroke ([place]) 04/2021   admitted at [facility]   Urinary incontinence    as stated by patient   Past Surgical History:  Past Surgical History:  Procedure Laterality Date   AORTIC VALVE REPLACEMENT N/A 08/04/2021   Procedure: RESECTION AORTIC VALVE TUMOR;  Surgeon: [name], MD;  Location: [place];  Service: Open Heart Surgery;  Laterality: N/A;   BREAST EXCISIONAL BIOPSY Right 1999   neg   BREAST LUMPECTOMY Right 2005   as reported by patient   BUBBLE STUDY  04/24/2021   Procedure: BUBBLE STUDY;  Surgeon: Sueanne Margarita, MD;  Location: [place];  Service: Cardiovascular;;   CATARACT EXTRACTION  12/2011   CATARACT EXTRACTION W/PHACO Left 12/02/2016   Procedure: CATARACT EXTRACTION PHACO AND INTRAOCULAR LENS PLACEMENT ([place]);  Surgeon: Estill Cotta, MD;  Location: [facility];  Service: Ophthalmology;  Laterality: Left;  [place] 2:14 AP% 28.4 CDE 59.73 Fluid pack lot # [id number]   CORONARY ARTERY BYPASS GRAFT N/A 08/04/2021   Procedure: CORONARY ARTERY BYPASS GRAFTING (CABG) X  4  USING LEFT INTERNAL MAMMARY ARTERY AND RIGHT GREATER SAPHENOUS VEIN  ENDOSCOPIC CONDUITS;  Surgeon: [name], MD;  Location: [place];  Service: Open Heart Surgery;  Laterality: N/A;   DIAGNOSTIC LAPAROSCOPY     EYE SURGERY     IR CT HEAD LTD  04/19/2021   IR PERCUTANEOUS ART THROMBECTOMY/INFUSION INTRACRANIAL INC DIAG ANGIO  04/19/2021       IR PERCUTANEOUS ART THROMBECTOMY/INFUSION INTRACRANIAL INC DIAG ANGIO  04/19/2021   IR US GUIDE VASC ACCESS LEFT  04/19/2021   RADIOLOGY WITH ANESTHESIA N/A 04/19/2021   Procedure: IR WITH ANESTHESIA;  Surgeon: Luanne Bras, MD;  Location: [place];  Service: Radiology;  Laterality: N/A;   TEE WITHOUT CARDIOVERSION N/A 04/24/2021   Procedure: TRANSESOPHAGEAL ECHOCARDIOGRAM (TEE);  Surgeon: Sueanne Margarita, MD;  Location: [facility];  Service: Cardiovascular;  Laterality: N/A;   TEE WITHOUT CARDIOVERSION N/A 08/04/2021   Procedure: TRANSESOPHAGEAL ECHOCARDIOGRAM (TEE);  Surgeon: [name], MD;  Location: [place];  Service: Open Heart Surgery;  Laterality: N/A;   TOTAL HIP ARTHROPLASTY Right 04/04/2018   Procedure: TOTAL HIP ARTHROPLASTY;  Surgeon: Dereck Leep, MD;  Location: [facility];  Service: Orthopedics;  Laterality: Right;    Assessment & Plan Clinical Impression: Patient is a 65 y.o. year old female with right-handed female with history of diabetes mellitus hypertension hyperlipidemia remote tobacco use obesity with BMI 30.83, anxiety/depression as well as right thalamic infarction receiving CIR 04/29/2021 to 05/27/2021.  She was discharged home ambulating 200 feet contact-guard assist.  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  Per chart review patient lives with her daughter.  1 level home 3 steps to entry.  Independent with assistive device driving short distances.  Presented 08/04/2021 with noted history findings of papillary fibroelastoma identified during work-up of CVA and plan was for patient to complete inpatient rehab  services of which she did.  She had a coronary CT which showed significant stenosis in all the coronaries with definite impairment of flow in the LAD and diagonal distribution and due to these findings patient underwent median sternotomy resection of fibroelastoma from aortic valve/CABG x4 08/04/2021 per Dr. Roxan Hockey.  Sternal precautions as indicated.  Hospital course acute blood loss anemia 7.7 transfused latest hemoglobin 10.4.  She currently remains on aspirin for history of CVA as well as CABG.  Subcutaneous Lovenox initiated for DVT prophylaxis.  Tolerating a regular diet.  Therapy evaluations completed due to patient decreased functional mobility was admitted for a comprehensive rehab program.   Patient transferred to CIR on 08/11/2021 .    Patient currently requires mod with basic self-care skills secondary to muscle weakness, decreased cardiorespiratoy endurance, and decreased standing balance, decreased balance strategies, and difficulty maintaining precautions.  Prior to hospitalization, patient could complete ADLs with modified independent .  Patient will benefit from skilled intervention to decrease level of assist with basic self-care skills and increase independence with basic self-care skills prior to discharge home with care partner.  Anticipate patient will require intermittent supervision and follow up home health.  OT - End of Session Activity Tolerance: Tolerates 30+ min activity with multiple rests Endurance Deficit: Yes Endurance Deficit Description: required frequent rest breaks, reports poor sleep overnight OT Assessment Rehab Potential (ACUTE ONLY): Good OT Barriers to Discharge: Weight bearing restrictions OT Barriers to Discharge Comments: sternal precautions OT Patient demonstrates impairments in the following area(s): Balance;Endurance;Motor;Pain;Safety OT Basic ADL's Functional Problem(s): Grooming;Bathing;Dressing;Toileting OT Transfers Functional Problem(s):  Toilet;Tub/Shower OT Additional Impairment(s): Fuctional Use of Upper Extremity OT Plan OT Intensity: Minimum of 1-2 x/day, 45 to 90 minutes OT Frequency: 5 out of 7 days OT Duration/Estimated Length of Stay: 10-14 days OT Treatment/Interventions: Balance/vestibular training;Discharge planning;Disease Lawyer;Functional mobility training;Neuromuscular re-education;Pain management;Patient/family education;Psychosocial support;Self Care/advanced ADL retraining;Skin care/wound managment;Therapeutic Activities;Therapeutic Exercise;UE/LE Strength taining/ROM;UE/LE Coordination activities OT Basic Self-Care Anticipated Outcome(s): Supervision OT Toileting Anticipated Outcome(s): Supervision OT Bathroom Transfers Anticipated Outcome(s): Supervision OT Recommendation Patient destination: Home Follow Up Recommendations: Home health OT;24 hour supervision/assistance Equipment Recommended: None recommended by OT Equipment Details: pt has tub bench and 3 in1 from previous admission   OT Evaluation Precautions/Restrictions  Precautions Precautions: Fall;Sternal Restrictions Weight Bearing Restrictions: Yes (sternal precautions) Other Position/Activity Restrictions: sternal precautions  Pain Pain Assessment Pain Scale: 0-10 Pain Score: 0-No pain Home Living/Prior Functioning Home Living Available Help at Discharge: Family, Available 24 hours/day Type of Home: House Home Access: Stairs to enter [employer] of Steps: 2-3 Entrance Stairs-Rails: Right, Left, Can reach both Home Layout: One level Bathroom Shower/Tub: Chiropodist: Standard Additional Comments: has tub transfer bench and 3 in1 from previous admission  Lives With: Alone, Other (Comment) (prior to CVA 04/2021 pt lived alone, now is staying with daughter) IADL History Homemaking Responsibilities: No Current License: Yes Prior Function Level of  Independence: Independent with basic ADLs, Requires assistive device for independence  Able to Take Stairs?: Yes Driving: Yes Comments: driving and independent with ADLs, daughter assist with meds and cooking/cleaning Vision Baseline Vision/History: 1 Wears glasses (reading) Ability to  See in Adequate Light: 0 Adequate Patient Visual Report: No change from baseline Vision Assessment?: No apparent visual deficits Perception  Perception: Within Functional Limits Praxis Praxis: Intact Cognition Overall Cognitive Status: Within Functional Limits for tasks assessed Arousal/Alertness: Awake/alert Orientation Level: Person;Place;Situation Person: Oriented Place: Oriented Situation: Oriented Year: 2022 Month: August Day of Week: Correct Immediate Memory Recall: Sock;Blue;Bed Memory Recall Sock: Without Cue Memory Recall Blue: Without Cue Memory Recall Bed: Without Cue Attention: Selective;Alternating Selective Attention: Appears intact Awareness: Appears intact Problem Solving: Appears intact Safety/Judgment: Appears intact Sensation Sensation Light Touch: Appears Intact (heightened to touch in L hand) Proprioception: Impaired by gross assessment Coordination Gross Motor Movements are Fluid and Coordinated: No Fine Motor Movements are Fluid and Coordinated: No Finger Nose Finger Test: decreased in LUE due to hemiplegia Motor  Motor Motor: Hemiplegia;Abnormal postural alignment and control Motor - Skilled Clinical Observations: grossly uncoordinated due to previous CVA, difficulty maintaining sternal precautions, fatigue, generalized weakness/deconditioning, decreased balance/postural control.  Trunk/Postural Assessment  Cervical Assessment Cervical Assessment: Exceptions to [facility] (forward head) Thoracic Assessment Thoracic Assessment: Exceptions to [facility] (kyphosis) Lumbar Assessment Lumbar Assessment: Exceptions to [facility] (posterior pelvic tilt in sitting) Postural  Control Postural Control: Deficits on evaluation  Balance Balance Balance Assessed: Yes Dynamic Sitting Balance Dynamic Sitting - Balance Support: During functional activity Dynamic Sitting - Level of Assistance: 4: Min assist Dynamic Sitting - Balance Activities: Lateral lean/weight shifting;Forward lean/weight shifting Sitting balance - Comments: during LB bathing/dressing Static Standing Balance Static Standing - Balance Support: Bilateral upper extremity supported;During functional activity Static Standing - Level of Assistance: 4: Min assist Extremity/Trunk Assessment RUE Assessment RUE Assessment: Within Functional Limits LUE Assessment LUE Assessment: Exceptions to [facility] Active Range of Motion (AROM) Comments: WNL, requires increased time due to L hemiplegia but able to access full ROM General Strength Comments: grossly 3-4/5, limited assessment due to sternal precautions  Care Tool Care Tool Self Care Eating        Oral Care         Bathing   Body parts bathed by patient: Right arm;Left arm;Chest;Abdomen;Right upper leg;Left upper leg;Face Body parts bathed by helper: Front perineal area;Buttocks   Assist Level: Moderate Assistance - Patient 50 - 74%    Upper Body Dressing(including orthotics)   What is the patient wearing?: Pull over shirt   Assist Level: Minimal Assistance - Patient > 75%    Lower Body Dressing (excluding footwear)   What is the patient wearing?: Pants Assist for lower body dressing: Maximal Assistance - Patient 25 - 49%    Putting on/Taking off footwear     Assist for footwear: Total Assistance - Patient < 25%       Care Tool Toileting Toileting activity   Assist for toileting: Maximal Assistance - Patient 25 - 49%     Care Tool Bed Mobility Roll left and right activity   Roll left and right assist level: Contact Guard/Touching assist    Sit to lying activity   Sit to lying assist level: Minimal Assistance - Patient > 75%    Lying  to sitting on side of bed activity   Lying to sitting on side of bed assist level: the ability to move from lying on the back to sitting on the side of the bed with no back support.: Moderate Assistance - Patient 50 - 74%     Care Tool Transfers Sit to stand transfer   Sit to stand assist level: Moderate Assistance - Patient 50 - 74%  Chair/bed transfer         Toilet transfer   Assist Level: Moderate Assistance - Patient 50 - 74%     Care Tool Cognition  Expression of Ideas and Wants Expression of Ideas and Wants: 3. Some difficulty - exhibits some difficulty with expressing needs and ideas (e.g, some words or finishing thoughts) or speech is not clear  Understanding Verbal and Non-Verbal Content Understanding Verbal and Non-Verbal Content: 3. Usually understands - understands most conversations, but misses some part/intent of message. Requires cues at times to understand   Memory/Recall Ability Memory/Recall Ability : Current season;Location of own room;That he or she is in a hospital/hospital unit   Refer to Care Plan for [place] 1 OT Short Term Goal 1 (Week 1): Pt will complete sit > stand with CGA while maintaining sternal precautions OT Short Term Goal 2 (Week 1): Pt will complete LB dressing with min assist while adhering ot sternal precautions OT Short Term Goal 3 (Week 1): Pt will complete toileting (clothing management and hygiene) with CGA while adhering to sternal precautions  Recommendations for other services: None    Skilled Therapeutic Intervention OT eval completed with discussion of rehab process, OT purpose, POC, ELOS, and goals.  ADL assessment completed with focus on bathing and dressing from EOB.  Pt reports being fatigued and not sleeping well overnight, therefore requesting to complete bathing from EOB.  Pt required mod assist for bed mobility due to inability to motor plan getting OOB without pushing up and breaking her sternal  precautions.  Pt completed bathing and dressing with CGA for sitting balance and Min assist when pulling shirt overhead due to sternal precautions.  Pt required mod-max assist for LB dressing with mod assist to stand and requiring assistance to thread RLE. Pt reports need to toilet.  Completed stand pivot transfer mod assist to [facility]  Pt reports dizziness after sitting on BSC ~3 mins.  Completed stand pivot transfer mod assist and assessed BP seated EOB 106/71 HR 71.  Pt returned to semi-reclined and reports feeling exhausted from minimal sleep overnight.  MD arrived during session, discussed change in meds overnight with possible side effects impacting sleep.  Therapist applied WHO to LUE per pt request.  ADL ADL Upper Body Bathing: Supervision/safety;Contact guard Where Assessed-Upper Body Bathing: Edge of bed Lower Body Bathing: Moderate assistance Where Assessed-Lower Body Bathing: Edge of bed Upper Body Dressing: Minimal assistance Where Assessed-Upper Body Dressing: Edge of bed Lower Body Dressing: Moderate assistance Where Assessed-Lower Body Dressing: Edge of bed Toileting: Maximal assistance Where Assessed-Toileting: Bedside Commode Toilet Transfer: Moderate assistance Toilet Transfer Method: Stand pivot Toilet Transfer Equipment: Bedside commode Mobility  Bed Mobility Bed Mobility: Rolling Right;Right Sidelying to Sit;Sit to Sidelying Right Rolling Right: Minimal Assistance - Patient > 75% Right Sidelying to Sit: Moderate Assistance - Patient 50-74% Sit to Sidelying Right: Minimal Assistance - Patient > 75% Transfers Sit to Stand: Moderate Assistance - Patient 50-74% Stand to Sit: Minimal Assistance - Patient > 75%   Discharge Criteria: Patient will be discharged from OT if patient refuses treatment 3 consecutive times without medical reason, if treatment goals not met, if there is a change in medical status, if patient makes no progress towards goals or if patient is discharged  from hospital.  The above assessment, treatment plan, treatment alternatives and goals were discussed and mutually agreed upon: by patient  Anastasia Tompson, [facility] 08/12/2021, 11:08 AM

## 2021-08-12 NOTE — Progress Notes (Signed)
Inpatient Rehabilitation Care Coordinator Assessment and Plan Patient Details  Name: [name] MRN: [id number] Date of Birth: 1956/10/18  Today's Date: 08/12/2021  Hospital Problems: Principal Problem:   Debility  Past Medical History:  Past Medical History:  Diagnosis Date   Anxiety    Arthritis    Cataract    left   Depression    Diabetes mellitus without complication ([place])    Environmental and seasonal allergies    GERD (gastroesophageal reflux disease)    Hyperlipidemia    Hypertension    Joint pain    as reported by patient   Right thalamic infarction ([place]) 04/29/2021   Stroke ([place]) 04/2021   admitted at [facility]   Urinary incontinence    as stated by patient   Past Surgical History:  Past Surgical History:  Procedure Laterality Date   AORTIC VALVE REPLACEMENT N/A 08/04/2021   Procedure: RESECTION AORTIC VALVE TUMOR;  Surgeon: [name], MD;  Location: [place];  Service: Open Heart Surgery;  Laterality: N/A;   BREAST EXCISIONAL BIOPSY Right 1999   neg   BREAST LUMPECTOMY Right 2005   as reported by patient   BUBBLE STUDY  04/24/2021   Procedure: BUBBLE STUDY;  Surgeon: Sueanne Margarita, MD;  Location: [place];  Service: Cardiovascular;;   CATARACT EXTRACTION  12/2011   CATARACT EXTRACTION W/PHACO Left 12/02/2016   Procedure: CATARACT EXTRACTION PHACO AND INTRAOCULAR LENS PLACEMENT ([place]);  Surgeon: Estill Cotta, MD;  Location: [facility];  Service: Ophthalmology;  Laterality: Left;  [place] 2:14 AP% 28.4 CDE 59.73 Fluid pack lot # [id number]   CORONARY ARTERY BYPASS GRAFT N/A 08/04/2021   Procedure: CORONARY ARTERY BYPASS GRAFTING (CABG) X  4  USING LEFT INTERNAL MAMMARY ARTERY AND RIGHT GREATER SAPHENOUS VEIN ENDOSCOPIC CONDUITS;  Surgeon: [name], MD;  Location: [place];  Service: Open Heart Surgery;  Laterality: N/A;   DIAGNOSTIC LAPAROSCOPY     EYE SURGERY     IR CT HEAD LTD  04/19/2021   IR PERCUTANEOUS ART  THROMBECTOMY/INFUSION INTRACRANIAL INC DIAG ANGIO  04/19/2021       IR PERCUTANEOUS ART THROMBECTOMY/INFUSION INTRACRANIAL INC DIAG ANGIO  04/19/2021   IR US GUIDE VASC ACCESS LEFT  04/19/2021   RADIOLOGY WITH ANESTHESIA N/A 04/19/2021   Procedure: IR WITH ANESTHESIA;  Surgeon: Luanne Bras, MD;  Location: [place];  Service: Radiology;  Laterality: N/A;   TEE WITHOUT CARDIOVERSION N/A 04/24/2021   Procedure: TRANSESOPHAGEAL ECHOCARDIOGRAM (TEE);  Surgeon: Sueanne Margarita, MD;  Location: [facility];  Service: Cardiovascular;  Laterality: N/A;   TEE WITHOUT CARDIOVERSION N/A 08/04/2021   Procedure: TRANSESOPHAGEAL ECHOCARDIOGRAM (TEE);  Surgeon: [name], MD;  Location: [place];  Service: Open Heart Surgery;  Laterality: N/A;   TOTAL HIP ARTHROPLASTY Right 04/04/2018   Procedure: TOTAL HIP ARTHROPLASTY;  Surgeon: Dereck Leep, MD;  Location: [facility];  Service: Orthopedics;  Laterality: Right;   Social History:  reports that she quit smoking about 14 years ago. Her smoking use included cigarettes. She has a 37.50 pack-year smoking history. She has never used smokeless tobacco. She reports that she does not currently use alcohol. She reports that she does not use drugs.  Family / Support Systems Marital Status: Widow/Widower Patient Roles: Parent, Other (Comment) (employee) Children: Kristen-daughter [phone] Other Supports: Azzie Glatter [phone] Anticipated Caregiver: Erasmo Downer and her boyfriend Ability/Limitations of Caregiver: Daughter works but boyfriend is with ,may be times she is alone Caregiver Availability: 24/7 Family Dynamics: Close knit with both  children and has many friends and colleagues at [employer], where she worked before her CVA.  Social History Preferred language: English Religion: Non-Denominational Cultural Background: No issues Education: HS Health Literacy - How often do you need to have someone help you when you read instructions,  pamphlets, or other written material from your doctor or pharmacy?: Never Writes: Yes Employment Status: Employed Name of Employer: [employer] prior to CVA Return to Work Plans: Would like to but must be independent Public relations account executive Issues: No issues Guardian/Conservator: None-according to MD pt is capable of making her own decisions while here   Abuse/Neglect Abuse/Neglect Assessment Can Be Completed: Yes Physical Abuse: Denies Verbal Abuse: Denies Sexual Abuse: Denies Exploitation of patient/patient's resources: Denies Self-Neglect: Denies  Patient response to: Social Isolation - How often do you feel lonely or isolated from those around you?: Never  Emotional Status Pt's affect, behavior and adjustment status: Pt is motivated to do well and recover form her heart surgery, she feels this is what htey found after her stroke. She is glad the surgery is over and now she can focus on her recovery. She reports she was doing well after her CVA and was recovering and did well between 04/2021 and now. She feels she needs to recover again but is ready for it Recent Psychosocial Issues: other health issues-recent CVA 04/2021 Psychiatric History: History of depression but takes medications and feels it helps her cope. She also talks a lot and feels this helps too Substance Abuse History: Quit tobacco and feels bettrer since she did, she realizes it is bad for your health and does not want to have another stroke  Patient / Family Perceptions, Expectations & Goals Pt/Family understanding of illness & functional limitations: Pt and daughter can explain her surgery and precautions as a result. Both talk with the MD and feel have a good understanding of her treatment plan going forward. She is not one to not ask questions Premorbid pt/family roles/activities: Mom, employee, grandmother, friend, etc Anticipated changes in roles/activities/participation: resume Pt/family  expectations/goals: Pt states: " I want to get where I was before, I was doing well before this and aftr my stroke."  Daughter states: " I hope she can do what she did before, she was doing well."  [employer]: Other (Comment) Premorbid Home Care/DME Agencies: Other (Comment) (Center well followed PTA  rw, 3 in 1 wheelchair) Transportation available at discharge: Children Is the patient able to respond to transportation needs?: Yes In the past 12 months, has lack of transportation kept you from medical appointments or from getting medications?: No In the past 12 months, has lack of transportation kept you from meetings, work, or from getting things needed for daily living?: No  Discharge Planning Living Arrangements: Children Support Systems: Children, Other relatives, Friends/neighbors Type of Residence: Private residence Insurance Resources: [employer], Multimedia programmer (specify) Pharmacologist) Financial Resources: Other (Comment) (STD) Financial Screen Referred: No Living Expenses: Lives with family Money Management: Patient Does the patient have any problems obtaining your medications?: No Home Management: Daughter helps her since CVA Patient/Family Preliminary Plans: Return home with daughter and daughter's boyfriend who together almost 24/7. Daughter and boyfriend work but different shifts. At times pt will be alone but feels can manage. Has been here before and knows the routine and process. Care Coordinator Anticipated Follow Up Needs: HH/OP  Clinical Impression Pleasant female well known to this worker since she was here in May of this year. She did well recovery from  her stroke. She hopes to do well as well again. Aware of tentative plan for discharge 9/13. Will work toward this.  [name] 08/12/2021, 3:34 PM

## 2021-08-12 NOTE — Progress Notes (Signed)
Inpatient Rehabilitation Medication Review by a Pharmacist   Type of Medication Issue Identified Description of Issue Recommendation(s)  Drug Interaction(s) (clinically significant)     Duplicate Therapy     Allergy     No Medication Administration End Date     Incorrect Dose     Additional Drug Therapy Needed  Home Semglee ordered to be resumed at discharge. (Levemir used as inpatient) Resume Semglee if CBGs dictate. No inpatient insulin since 8/25.  Significant med changes from prior encounter (inform family/care partners about these prior to discharge).    Other       Clinically significant medication issues were identified that warrant physician communication and completion of prescribed/recommended actions by midnight of the next day:  Yes  Name of provider notified for urgent issues identified: [place],  Provider Method of Notification: Chat discussion  Pharmacist comments: Dr. Ranell Patrick acknowledged glucose not elevated currently and will resume Semglee as glucose dictates.  Time spent performing this drug regimen review (minutes):  10  [name], PharmD, BCPS Clinical Staff Pharmacist [email or web address] [place] 08/12/2021 12:52 PM

## 2021-08-12 NOTE — Evaluation (Signed)
Physical Therapy Assessment and Plan  Patient Details  Name: [name] MRN: [id number] Date of Birth: 1956-11-10  PT Diagnosis: Abnormal posture, Abnormality of gait, Coordination disorder, Difficulty walking, Hemiparesis non-dominant, Impaired sensation, and Muscle weakness Rehab Potential: Good ELOS: 14-16 days   Today's Date: 08/12/2021 PT Individual Time: 3846-6599 PT Individual Time Calculation (min): 40 min   Today's Date: 08/12/2021 PT Missed Time: 20 Minutes Missed Time Reason: Patient fatigue   Hospital Problem: Principal Problem:   Debility   Past Medical History:  Past Medical History:  Diagnosis Date   Anxiety    Arthritis    Cataract    left   Depression    Diabetes mellitus without complication ([place])    Environmental and seasonal allergies    GERD (gastroesophageal reflux disease)    Hyperlipidemia    Hypertension    Joint pain    as reported by patient   Right thalamic infarction ([place]) 04/29/2021   Stroke ([place]) 04/2021   admitted at [facility]   Urinary incontinence    as stated by patient   Past Surgical History:  Past Surgical History:  Procedure Laterality Date   AORTIC VALVE REPLACEMENT N/A 08/04/2021   Procedure: RESECTION AORTIC VALVE TUMOR;  Surgeon: [name], MD;  Location: [place];  Service: Open Heart Surgery;  Laterality: N/A;   BREAST EXCISIONAL BIOPSY Right 1999   neg   BREAST LUMPECTOMY Right 2005   as reported by patient   BUBBLE STUDY  04/24/2021   Procedure: BUBBLE STUDY;  Surgeon: Sueanne Margarita, MD;  Location: [place];  Service: Cardiovascular;;   CATARACT EXTRACTION  12/2011   CATARACT EXTRACTION W/PHACO Left 12/02/2016   Procedure: CATARACT EXTRACTION PHACO AND INTRAOCULAR LENS PLACEMENT ([place]);  Surgeon: Estill Cotta, MD;  Location: [facility];  Service: Ophthalmology;  Laterality: Left;  [place] 2:14 AP% 28.4 CDE 59.73 Fluid pack lot # [id number]   CORONARY ARTERY BYPASS GRAFT N/A 08/04/2021   Procedure:  CORONARY ARTERY BYPASS GRAFTING (CABG) X  4  USING LEFT INTERNAL MAMMARY ARTERY AND RIGHT GREATER SAPHENOUS VEIN ENDOSCOPIC CONDUITS;  Surgeon: [name], MD;  Location: [place];  Service: Open Heart Surgery;  Laterality: N/A;   DIAGNOSTIC LAPAROSCOPY     EYE SURGERY     IR CT HEAD LTD  04/19/2021   IR PERCUTANEOUS ART THROMBECTOMY/INFUSION INTRACRANIAL INC DIAG ANGIO  04/19/2021       IR PERCUTANEOUS ART THROMBECTOMY/INFUSION INTRACRANIAL INC DIAG ANGIO  04/19/2021   IR US GUIDE VASC ACCESS LEFT  04/19/2021   RADIOLOGY WITH ANESTHESIA N/A 04/19/2021   Procedure: IR WITH ANESTHESIA;  Surgeon: Luanne Bras, MD;  Location: [place];  Service: Radiology;  Laterality: N/A;   TEE WITHOUT CARDIOVERSION N/A 04/24/2021   Procedure: TRANSESOPHAGEAL ECHOCARDIOGRAM (TEE);  Surgeon: Sueanne Margarita, MD;  Location: [facility] ENDOSCOPY;  Service: Cardiovascular;  Laterality: N/A;   TEE WITHOUT CARDIOVERSION N/A 08/04/2021   Procedure: TRANSESOPHAGEAL ECHOCARDIOGRAM (TEE);  Surgeon: [name], MD;  Location: [place];  Service: Open Heart Surgery;  Laterality: N/A;   TOTAL HIP ARTHROPLASTY Right 04/04/2018   Procedure: TOTAL HIP ARTHROPLASTY;  Surgeon: Dereck Leep, MD;  Location: [facility];  Service: Orthopedics;  Laterality: Right;    Assessment & Plan Clinical Impression: Patient is a 65 y.o. year old female with history of diabetes mellitus hypertension hyperlipidemia remote tobacco use obesity with BMI 30.83, anxiety/depression as well as right thalamic infarction receiving CIR 04/29/2021 to 05/27/2021.  She was discharged home  ambulating 200 feet contact-guard assist.  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  Per chart review patient lives with her daughter.  1 level home 3 steps to entry.  Independent with assistive device driving short distances.  Presented 08/04/2021 with noted history findings of papillary  fibroelastoma identified during work-up of CVA and plan was for patient to complete inpatient rehab services of which she did.  She had a coronary CT which showed significant stenosis in all the coronaries with definite impairment of flow in the LAD and diagonal distribution and due to these findings patient underwent median sternotomy resection of fibroelastoma from aortic valve/CABG x4 08/04/2021 per Dr. Roxan Hockey.  Sternal precautions as indicated.  Hospital course acute blood loss anemia 7.7 transfused latest hemoglobin 10.4.  She currently remains on aspirin for history of CVA as well as CABG.  Subcutaneous Lovenox initiated for DVT prophylaxis.  Tolerating a regular diet.  Therapy evaluations completed due to patient decreased functional mobility was admitted for a comprehensive rehab program. She complains of severe left hand pain.   Patient currently requires mod with mobility secondary to muscle weakness, decreased cardiorespiratoy endurance, unbalanced muscle activation and decreased coordination, decreased motor planning, and decreased standing balance, decreased postural control, hemiplegia, decreased balance strategies, and difficulty maintaining precautions.  Prior to hospitalization, patient was modified independent  with mobility and lived with Daughter (will live with her daughter temporarily if she needs to but wants to return home alone.) in a House home.  Home access is 2Stairs to enter.  Patient will benefit from skilled PT intervention to maximize safe functional mobility, minimize fall risk, and decrease caregiver burden for planned discharge home with intermittent assist.  Anticipate patient will benefit from follow up [facility] at discharge.  PT - End of Session Activity Tolerance: Tolerates 30+ min activity with multiple rests Endurance Deficit: Yes Endurance Deficit Description: required frequent rest breaks, reports poor sleep overnight PT Assessment Rehab Potential (ACUTE/IP ONLY):  Good PT Barriers to Discharge: Inaccessible home environment;Decreased caregiver support;Home environment access/layout;Weight bearing restrictions PT Barriers to Discharge Comments: 3 STE, daughter can nly provide intermittent assist, difficulty maintaining sternal precautions PT Patient demonstrates impairments in the following area(s): Balance;Endurance;Motor;Nutrition;Pain;Perception;Sensory;Skin Integrity PT Transfers Functional Problem(s): Bed Mobility;Bed to Chair;Car;Furniture PT Locomotion Functional Problem(s): Ambulation;Wheelchair Mobility;Stairs PT Plan PT Intensity: Minimum of 1-2 x/day ,45 to 90 minutes PT Frequency: 5 out of 7 days PT Duration Estimated Length of Stay: 14-16 days PT Treatment/Interventions: Ambulation/gait training;Discharge planning;Functional mobility training;Psychosocial support;Therapeutic Activities;Balance/vestibular training;Disease management/prevention;Neuromuscular re-education;Skin care/wound management;Therapeutic Exercise;Wheelchair propulsion/positioning;Cognitive remediation/compensation;DME/adaptive equipment instruction;Pain management;Splinting/orthotics;UE/LE Strength taining/ROM;Community reintegration;Functional electrical stimulation;Patient/family education;Stair training;UE/LE Coordination activities PT Transfers Anticipated Outcome(s): supervision with LRAD PT Locomotion Anticipated Outcome(s): supervision with LRAD PT Recommendation Follow Up Recommendations: Home health PT Patient destination: Home Equipment Recommended: To be determined Equipment Details: has RW, SPC, and WC   PT Evaluation Precautions/Restrictions Precautions Precautions: Fall;Sternal Restrictions Weight Bearing Restrictions: Yes (sternal precautions) Other Position/Activity Restrictions: sternal precautions Pain Pain Assessment Pain Scale: 0-10 Pain Score: 0-No pain Pain Interference Pain Interference Pain Effect on Sleep: 0. Does not apply - I have not  had any pain or hurting in the past 5 days (pt falling in/out of sleep while therapist asked questions) Pain Interference with Therapy Activities: 0. Does not apply - I have not received rehabilitationtherapy in the past 5 days Pain Interference with Day-to-Day Activities: 1. Rarely or not at all Home Living/Prior [place] Available Help at Discharge: Family;Available  24 hours/day;Available PRN/intermittently (daughter works but 46 y/o grandchild will be home) Type of Home: House Home Access: Stairs to enter Technical brewer of Steps: 2 Entrance Stairs-Rails: Right;Left;Can reach both Home Layout: One level Bathroom Shower/Tub: Optometrist: Yes Additional Comments: has tub transfer bench and 3 in1 from previous admission  Lives With: Daughter (will live with her daughter temporarily if she needs to but wants to return home alone.) Prior Function Level of Independence: Requires assistive device for independence  Able to Take Stairs?: Yes Driving: Yes Vocation: On disability 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 Comments: driving and independent with ADLs, daughter assist with meds and cooking/cleaning Vision/Perception  Vision - History Ability to See in Adequate Light: 0 Adequate  Cognition Overall Cognitive Status: Within Functional Limits for tasks assessed Arousal/Alertness: Lethargic Orientation Level: Oriented X4 Selective Attention: Appears intact Memory: Appears intact Awareness: Appears intact Problem Solving: Appears intact Safety/Judgment: Appears intact Comments: requires cues to maintain sternal precautions Sensation Sensation Light Touch: Impaired by gross assessment Proprioception: Impaired by gross assessment Additional Comments: decreased sensation on LLE Coordination Gross Motor Movements are Fluid and Coordinated: No Fine Motor Movements are Fluid and  Coordinated: No Coordination and Movement Description: grossly uncoordinated due to previous CVA, difficulty maintaining sternal precautions, fatigue, generalized weakness/deconditioning, decreased balance/postural control. Finger Nose Finger Test: decreased in LUE due to hemiplegia Heel Shin Test: [facility] bilaterally Motor  Motor Motor: Hemiplegia;Abnormal postural alignment and control Motor - Skilled Clinical Observations: grossly uncoordinated due to previous CVA, difficulty maintaining sternal precautions, fatigue, generalized weakness/deconditioning, decreased balance/postural control.  Trunk/Postural Assessment  Cervical Assessment Cervical Assessment: Exceptions to [facility] (forward head) Thoracic Assessment Thoracic Assessment: Exceptions to [facility] (kyphosis) Lumbar Assessment Lumbar Assessment: Exceptions to [facility] (posterior pelvic tilt in sitting) Postural Control Postural Control: Deficits on evaluation  Balance Balance Balance Assessed: Yes Static Sitting Balance Static Sitting - Balance Support: Feet supported;Bilateral upper extremity supported Static Sitting - Level of Assistance: 5: Stand by assistance (supervision) Dynamic Sitting Balance Dynamic Sitting - Balance Support: Feet supported;No upper extremity supported Dynamic Sitting - Level of Assistance: 5: Stand by assistance (supervision) Dynamic Sitting - Balance Activities: Lateral lean/weight shifting;Forward lean/weight shifting Sitting balance - Comments: during LB bathing/dressing Static Standing Balance Static Standing - Balance Support: Bilateral upper extremity supported (RW) Static Standing - Level of Assistance: 5: Stand by assistance (CGA) Dynamic Standing Balance Dynamic Standing - Balance Support: Bilateral upper extremity supported (RW) Dynamic Standing - Level of Assistance: 3: Mod assist Dynamic Standing - Comments: with transfers and gait Extremity Assessment  RLE Assessment RLE Assessment: Exceptions to  [facility] General Strength Comments: grossly generalized to 4/5 LLE Assessment LLE Assessment: Exceptions to [facility] General Strength Comments: grossly generalized to 4-/5  Care Tool Care Tool Bed Mobility Roll left and right activity   Roll left and right assist level: Contact Guard/Touching assist    Sit to lying activity   Sit to lying assist level: Minimal Assistance - Patient > 75%    Lying to sitting on side of bed activity   Lying to sitting on side of bed assist level: the ability to move from lying on the back to sitting on the side of the bed with no back support.: Moderate Assistance - Patient 50 - 74%     Care Tool Transfers Sit to stand transfer   Sit to stand assist level: Maximal Assistance - Patient 25 - 49% (without AD)    Chair/bed transfer   Chair/bed transfer  assist level: Moderate Assistance - Patient 50 - 74% (with RW)     Toilet transfer   Assist Level: Moderate Assistance - Patient 50 - 74%    Car transfer Car transfer activity did not occur: Environmental limitations        Care Tool Locomotion Ambulation   Assist level: Moderate Assistance - Patient 50 - 74% Assistive device: Walker-rolling Max distance: 21f  Walk 10 feet activity   Assist level: Moderate Assistance - Patient - 50 - 74% Assistive device: Walker-rolling   Walk 50 feet with 2 turns activity Walk 50 feet with 2 turns activity did not occur: Safety/medical concerns (fatigue, LE weakness, decreased balance/postural control, deconditioning)      Walk 150 feet activity Walk 150 feet activity did not occur: Safety/medical concerns (fatigue, LE weakness, decreased balance/postural control, deconditioning)      Walk 10 feet on uneven surfaces activity Walk 10 feet on uneven surfaces activity did not occur: Safety/medical concerns (fatigue, LE weakness, decreased balance/postural control, deconditioning)      Stairs Stair activity did not occur: Safety/medical concerns (fatigue, LE weakness,  decreased balance/postural control, deconditioning)        Walk up/down 1 step activity Walk up/down 1 step or curb (drop down) activity did not occur: Safety/medical concerns (fatigue, LE weakness, decreased balance/postural control, deconditioning)     Walk up/down 4 steps activity did not occuR: Safety/medical concerns (fatigue, LE weakness, decreased balance/postural control, deconditioning)  Walk up/down 4 steps activity      Walk up/down 12 steps activity Walk up/down 12 steps activity did not occur: Safety/medical concerns (fatigue, LE weakness, decreased balance/postural control, deconditioning)      Pick up small objects from floor Pick up small object from the floor (from standing position) activity did not occur: Safety/medical concerns (LE weakness, decreased balance/postural control, deconditioning)      Wheelchair Is the patient using a wheelchair?: Yes Type of Wheelchair: Manual Wheelchair activity did not occur: Safety/medical concerns (fatigue, LE weakness, decreased balance/postural control, deconditioning)      Wheel 50 feet with 2 turns activity Wheelchair 50 feet with 2 turns activity did not occur: Safety/medical concerns (fatigue, LE weakness, decreased balance/postural control, deconditioning)    Wheel 150 feet activity Wheelchair 150 feet activity did not occur: Safety/medical concerns (fatigue, LE weakness, decreased balance/postural control, deconditioning)      Refer to Care Plan for Long Term Goals  SHORT TERM GOAL WEEK 1 PT Short Term Goal 1 (Week 1): pt will perform bed mobility with min A consistantly PT Short Term Goal 2 (Week 1): pt will transfer bed<>chair with LRAD and min A PT Short Term Goal 3 (Week 1): pt will ambulate 250fwith LRAD and min A  Recommendations for other services: None   Skilled Therapeutic Intervention Evaluation completed (see details above and below) with education on PT POC and goals and individual treatment initiated  with focus on functional mobility/transfers, generalized strengthening, dynamic standing balance/coordination, ambulation, and improved activity tolerance. Received pt supine in bed asleep, pt required max verbal cues to arouse but lethargic frequently falling in/out of sleep throughout session due to not sleeping well last night. Pt educated on PT evaluation, CIR policies, and therapy schedule and agreeable. Pt denied any pain during session. Pt able to recall sternal precautions but unable to functionally demonstrate understanding without max cues. NT present to take blood glucose. Pt performed bed mobility with min/mod A (see CareTool for details) and attempted to stand x 3 trials without RW. Pt  unable to come into standing while maintaining sternal precautions despite raising bed. On 3rd attempt and with max A pt demonstrated significant posterior lean and L knee buckling; returned to sitting. Pt reported mild dizziness requiring seated rest break for symptoms to resolve. Sit<>stand with RW and mod A and ambulated 69f with RW and min/mod A with cues for RW safety and turning technique. Pt insisted on returning to bed and requested to rest. Concluded session with pt supine in bed, needs within reach, and bed alarm on. Donned L wrist splint and updated safety plan.    Mobility Bed Mobility Bed Mobility: Rolling Right;Rolling Left;Supine to Sit;Sit to Supine Rolling Right: Contact Guard/Touching assist Rolling Left: Contact Guard/Touching assist Supine to Sit: Moderate Assistance - Patient 50-74% Sit to Supine: Minimal Assistance - Patient > 75% Transfers Transfers: Sit to Stand;Stand to Sit;Stand Pivot Transfers Sit to Stand: Maximal Assistance - Patient 25-49% Stand to Sit: Minimal Assistance - Patient > 75% Stand Pivot Transfers: Moderate Assistance - Patient 50 - 74% Stand Pivot Transfer Details: Verbal cues for technique;Verbal cues for precautions/safety Transfer (Assistive device): Rolling  walker Locomotion  Gait Ambulation: Yes Gait Assistance: Moderate Assistance - Patient 50-74% Gait Distance (Feet): 10 Feet Assistive device: Rolling walker Gait Assistance Details: Verbal cues for precautions/safety;Verbal cues for technique;Verbal cues for safe use of DME/AE Gait Assistance Details: verbal cues for RW safety when turning and for attention to LUE on RW handgrip Gait Gait: Yes Gait Pattern: Impaired Gait Pattern: Step-to pattern;Decreased stance time - left;Decreased stride length;Decreased step length - right;Decreased step length - left;Decreased weight shift to left;Trunk flexed;Poor foot clearance - left;Poor foot clearance - right Gait velocity: decreased   Discharge Criteria: Patient will be discharged from PT if patient refuses treatment 3 consecutive times without medical reason, if treatment goals not met, if there is a change in medical status, if patient makes no progress towards goals or if patient is discharged from hospital.  The above assessment, treatment plan, treatment alternatives and goals were discussed and mutually agreed upon: by patient  AAlfonse AlpersPT, DPT  08/12/2021, 12:14 PM

## 2021-08-12 NOTE — Progress Notes (Signed)
Physical Therapy Session Note  Patient Details  Name: [name] MRN: [id number] Date of Birth: [birth year]  Today's Date: 08/12/2021 PT Individual Time: [id number] PT Individual Time Calculation (min): 24 min   Today's Date: 08/12/2021 PT Missed Time: 36 Minutes Missed Time Reason: Patient fatigue  Short Term Goals: Week 1:  PT Short Term Goal 1 (Week 1): pt will perform bed mobility with min A consistantly PT Short Term Goal 2 (Week 1): pt will transfer bed<>chair with LRAD and min A PT Short Term Goal 3 (Week 1): pt will ambulate 55f with LRAD and min A  Skilled Therapeutic Interventions/Progress Updates:   Received pt supine in bed, pt reported not getting to take her nap and still feeling exhausted but prepared to "work hard" tomorrow in therapy and looking forward to taking a shower with OT. With convincing, pt agreed to work on short distance ambulation in room. Pt transferred supine<>sitting EOB from flat bed with mod A to maintain sternal precautions. Sit<>stand with RW and mod A from elevated bed with cues to "keep her hands in her lap" to avoid urge to push up from bed. Pt ambulated 135fwith RW and min/mod A with min cues for L attention as pt's LUE slipping off RW and getting RW stuck on L sock. Pt requested to return to bed reporting feeling "so weak that she might fall over" and plopping down onto bed upon returning due to fatigue. Pt transferred sit<>supine with min A and donned LUE wrist splint with total A. Provided pt with 18x18 manual WC and cushion to use when going to/from gym tomorrow. Concluded session with pt supine in bed, needs within reach, and bed alarm on. Provided pt with fresh drink. 36 minutes missed of skilled physical therapy due to fatigue.   Therapy Documentation Precautions:  Restrictions Weight Bearing Restrictions: Yes (sternal precautions)  Therapy/Group: Individual Therapy AnAlfonse AlpersT, DPT   08/12/2021, 7:23 AM

## 2021-08-12 NOTE — Progress Notes (Signed)
PROGRESS NOTE   Subjective/Complaints: +urinary retention- longstanding, does not like to be cathed -reviewed labs this morning -left hand pain can be excruciating. She did receive the brace  ROS: +insomnia   Objective:   No results found. Recent Labs    08/11/21 1549 08/12/21 0713  WBC 11.2* 11.1*  HGB 10.8* 10.4*  HCT 33.0* 31.4*  PLT 412* 380   Recent Labs    08/10/21 0035 08/11/21 1549 08/12/21 0713  NA 135  --  131*  K 3.7  --  4.2  CL 89*  --  90*  CO2 31  --  31  GLUCOSE 156*  --  200*  BUN 9  --  12  CREATININE 0.72 0.95 1.02*  CALCIUM 8.8*  --  8.6*    Intake/Output Summary (Last 24 hours) at 08/12/2021 1158 Last data filed at 08/12/2021 0700 Gross per 24 hour  Intake 60 ml  Output --  Net 60 ml        Physical Exam: Vital Signs Blood pressure 127/62, pulse 70, temperature 98.1 F (36.7 C), resp. rate 17, height '5\' 7"'$  (1.702 m), weight 89.4 kg, SpO2 90 %.  Gen: no distress, normal appearing HEENT: oral mucosa pink and moist, NCAT Cardio: Reg rate Chest: normal effort, normal rate of breathing Abd: soft, non-distended Ext: trace bilateral lower extremity edema Psych: pleasant, normal affect Skin:    Comments: Midline chest incision clean and dry. Mild right thigh bruising Neurological:     Comments: Patient is alert.  No acute distress.  Follows commands and oriented x3.  She recalls her latest inpatient rehab admission. Residual left sided weakness from prior stroke, wrist drop.  Assessment/Plan: 1. Functional deficits which require 3+ hours per day of interdisciplinary therapy in a comprehensive inpatient rehab setting. Physiatrist is providing close team supervision and 24 hour management of active medical problems listed below. Physiatrist and rehab team continue to assess barriers to discharge/monitor patient progress toward functional and medical goals  Care Tool:  Bathing     Body parts bathed by patient: Right arm, Left arm, Chest, Abdomen, Right upper leg, Left upper leg, Face   Body parts bathed by helper: Front perineal area, Buttocks     Bathing assist Assist Level: Moderate Assistance - Patient 50 - 74%     Upper Body Dressing/Undressing Upper body dressing   What is the patient wearing?: Pull over shirt    Upper body assist Assist Level: Minimal Assistance - Patient > 75%    Lower Body Dressing/Undressing Lower body dressing      What is the patient wearing?: Pants     Lower body assist Assist for lower body dressing: Maximal Assistance - Patient 25 - 49%     Toileting Toileting    Toileting assist Assist for toileting: Maximal Assistance - Patient 25 - 49%     Transfers Chair/bed transfer  Transfers assist           Locomotion Ambulation   Ambulation assist              Walk 10 feet activity   Assist           Walk 50 feet  activity   Assist           Walk 150 feet activity   Assist           Walk 10 feet on uneven surface  activity   Assist           Wheelchair     Assist               Wheelchair 50 feet with 2 turns activity    Assist            Wheelchair 150 feet activity     Assist          Blood pressure 127/62, pulse 70, temperature 98.1 F (36.7 C), resp. rate 17, height '5\' 7"'$  (1.702 m), weight 89.4 kg, SpO2 90 %.  Medical Problem List and Plan: 1.  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  Status post median sternotomy resection of fibroelastoma from aortic valve/CABG x4 08/04/2021.  Sternal precautions             -patient may shower but incision must be covered             -ELOS/Goals: 7-10 days            Initial CIR evaluations today 2.  Antithrombotics: -DVT/anticoagulation:  Pharmaceutical: Lovenox             -antiplatelet therapy: Aspirin 325 mg daily 3. Post-stroke left hand pain: Neurontin 300 mg  nightly, Robaxin and tramadol as needed. Continue  Cymbalta '20mg'$  daily.  4. Depression: Zoloft 100 mg daily, Xanax as needed.              -antipsychotic agents: N/A 5. Neuropsych: This patient is capable of making decisions on her own behalf. 6. Skin/Wound Care: Routine skin checks 7. Fluids/Electrolytes/Nutrition: Routine in and outs with follow-up chemistries 8.ABLA.Follow up CBC 9.  Diabetes mellitus with peripheral neuropathy.  Hemoglobin A1c 7.8.  DiaBeta 5 mg daily, Glucophage 1000 mg twice daily 10  Hypertension.  Norvasc 10 mg daily, Lasix 40 mg daily, hydralazine 10 mg nightly, Cozaar 100 mg daily, Lopressor 12.5 mg twice daily.  Monitor with increased mobility 11.  GERD.  Protonix 12.  Restless leg syndrome.  Requip nightly 13.  Hyperlipidemia.  Lipitor 14.  Obesity.  BMI 30.83.  Dietary follow-up 15.  History of right thalamic infarction 5/22.  Received CIR.  Continue aspirin. 16. Left wrist drop: splint ordered 17. Vitamin D deficiency: start ergocalciferol 50,000U once per week for 7 weeks 18. Suboptimal magnesium: start '250mg'$  magnesium gluconate HS. Continue magnesium oxide '400mg'$  BID    LOS: 1 days A FACE TO FACE EVALUATION WAS PERFORMED  [name] 08/12/2021, 11:58 AM

## 2021-08-12 NOTE — Progress Notes (Signed)
[facility] Individual Statement of Services  Patient Name:  KURTIS ELBAUM  Date:  08/12/2021  Welcome to [facility].  Our goal is to provide you with an individualized program based on your diagnosis and situation, designed to meet your specific needs.  With this comprehensive rehabilitation program, you will be expected to participate in at least 3 hours of rehabilitation therapies Monday-Friday, with modified therapy programming on the weekends.  Your rehabilitation program will include the following services:  Physical Therapy (PT), Occupational Therapy (OT), 24 hour per day rehabilitation nursing, Therapeutic Recreaction (TR), Care Coordinator, Rehabilitation Medicine, Nutrition Services, and Pharmacy Services  Weekly team conferences will be held on Tuesday to discuss your progress.  Your Inpatient Rehabilitation Care Coordinator will talk with you frequently to get your input and to update you on team discussions.  Team conferences with you and your family in attendance may also be held.  Expected length of stay: 14-16 days  Overall anticipated outcome: supervision with cueing  Depending on your progress and recovery, your program may change. Your Inpatient Rehabilitation Care Coordinator will coordinate services and will keep you informed of any changes. Your Inpatient Rehabilitation Care Coordinator's name and contact numbers are listed  below.  The following services may also be recommended but are not provided by the [facility]:   [facility] will be made to provide these services after discharge if needed.  Arrangements include referral to agencies that provide these services.  Your insurance has been verified to be:  [employer] and Medicare part A Your primary doctor is:  Lelon Huh  Pertinent information will be shared with your doctor and  your insurance company.  Inpatient Rehabilitation Care Coordinator:  Ovidio Kin, [place] or Emilia Beck  Information discussed with and copy given to patient by: [name], 08/12/2021, 3:18 PM

## 2021-08-13 DIAGNOSIS — R5381 Other malaise: Secondary | ICD-10-CM | POA: Diagnosis not present

## 2021-08-13 LAB — GLUCOSE, CAPILLARY
Glucose-Capillary: 137 mg/dL — ABNORMAL HIGH (ref 70–99)
Glucose-Capillary: 131 mg/dL — ABNORMAL HIGH (ref 70–99)
Glucose-Capillary: 164 mg/dL — ABNORMAL HIGH (ref 70–99)
Glucose-Capillary: 85 mg/dL (ref 70–99)

## 2021-08-13 MED ORDER — AMLODIPINE BESYLATE 5 MG PO TABS
5.0000 mg | ORAL_TABLET | Freq: Every day | ORAL | Status: DC
  Administered 2021-08-13: 5 mg via ORAL
  Filled 2021-08-13: qty 1

## 2021-08-13 MED ORDER — VITAMIN B-6 50 MG PO TABS
50.0000 mg | ORAL_TABLET | Freq: Every day | ORAL | Status: DC
  Administered 2021-08-13 – 2021-09-12 (×30): 50 mg via ORAL
  Filled 2021-08-13 (×32): qty 1

## 2021-08-13 MED ORDER — GABAPENTIN 400 MG PO CAPS
400.0000 mg | ORAL_CAPSULE | Freq: Every day | ORAL | Status: DC
  Administered 2021-08-13 – 2021-08-17 (×5): 400 mg via ORAL
  Filled 2021-08-13 (×5): qty 1

## 2021-08-13 NOTE — Progress Notes (Signed)
PROGRESS NOTE   Subjective/Complaints: Continues to experience severe left hand pain Did not sleep well again last night Had a BM yesterday Urinated this AM  ROS: +insomnia, +left hand pain   Objective:   No results found. Recent Labs    08/11/21 1549 08/12/21 0713  WBC 11.2* 11.1*  HGB 10.8* 10.4*  HCT 33.0* 31.4*  PLT 412* 380   Recent Labs    08/11/21 1549 08/12/21 0713  NA  --  131*  K  --  4.2  CL  --  90*  CO2  --  31  GLUCOSE  --  200*  BUN  --  12  CREATININE 0.95 1.02*  CALCIUM  --  8.6*    Intake/Output Summary (Last 24 hours) at 08/13/2021 1053 Last data filed at 08/12/2021 1200 Gross per 24 hour  Intake 30 ml  Output --  Net 30 ml        Physical Exam: Vital Signs Blood pressure 108/72, pulse 75, temperature 98.1 F (36.7 C), temperature source Oral, resp. rate 17, height '5\' 7"'$  (1.702 m), weight 91.2 kg, SpO2 93 %. Gen: no distress, normal appearing HEENT: oral mucosa pink and moist, NCAT Cardio: Reg rate Chest: normal effort, normal rate of breathing Abd: soft, non-distended Ext: trace bilateral lower extremity edema Psych: pleasant, normal affect Skin:    Comments: Midline chest incision clean and dry. Mild right thigh bruising Neurological:     Comments: Patient is alert.  No acute distress.  Follows commands and oriented x3.  She recalls her latest inpatient rehab admission. Residual left sided weakness from prior stroke, wrist drop.  Assessment/Plan: 1. Functional deficits which require 3+ hours per day of interdisciplinary therapy in a comprehensive inpatient rehab setting. Physiatrist is providing close team supervision and 24 hour management of active medical problems listed below. Physiatrist and rehab team continue to assess barriers to discharge/monitor patient progress toward functional and medical goals  Care Tool:  Bathing    Body parts bathed by patient: Right  arm, Left arm, Chest, Abdomen, Right upper leg, Left upper leg, Face   Body parts bathed by helper: Front perineal area, Buttocks     Bathing assist Assist Level: Moderate Assistance - Patient 50 - 74%     Upper Body Dressing/Undressing Upper body dressing   What is the patient wearing?: Button up shirt    Upper body assist Assist Level: Minimal Assistance - Patient > 75%    Lower Body Dressing/Undressing Lower body dressing      What is the patient wearing?: Underwear/pull up     Lower body assist Assist for lower body dressing: Maximal Assistance - Patient 25 - 49%     Toileting Toileting    Toileting assist Assist for toileting: Maximal Assistance - Patient 25 - 49%     Transfers Chair/bed transfer  Transfers assist     Chair/bed transfer assist level: Moderate Assistance - Patient 50 - 74%     Locomotion Ambulation   Ambulation assist      Assist level: Moderate Assistance - Patient 50 - 74% Assistive device: Walker-rolling Max distance: 32f   Walk 10 feet activity   Assist  Assist level: Moderate Assistance - Patient - 50 - 74% Assistive device: Walker-rolling   Walk 50 feet activity   Assist Walk 50 feet with 2 turns activity did not occur: Safety/medical concerns (fatigue, LE weakness, decreased balance/postural control, deconditioning)         Walk 150 feet activity   Assist Walk 150 feet activity did not occur: Safety/medical concerns (fatigue, LE weakness, decreased balance/postural control, deconditioning)         Walk 10 feet on uneven surface  activity   Assist Walk 10 feet on uneven surfaces activity did not occur: Safety/medical concerns (fatigue, LE weakness, decreased balance/postural control, deconditioning)         Wheelchair     Assist Is the patient using a wheelchair?: Yes Type of Wheelchair: Manual Wheelchair activity did not occur: Safety/medical concerns (fatigue, LE weakness, decreased  balance/postural control, deconditioning)         Wheelchair 50 feet with 2 turns activity    Assist    Wheelchair 50 feet with 2 turns activity did not occur: Safety/medical concerns (fatigue, LE weakness, decreased balance/postural control, deconditioning)       Wheelchair 150 feet activity     Assist  Wheelchair 150 feet activity did not occur: Safety/medical concerns (fatigue, LE weakness, decreased balance/postural control, deconditioning)       Blood pressure 108/72, pulse 75, temperature 98.1 F (36.7 C), temperature source Oral, resp. rate 17, height '5\' 7"'$  (1.702 m), weight 91.2 kg, SpO2 93 %.  Medical Problem List and Plan: 1.  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  Status post median sternotomy resection of fibroelastoma from aortic valve/CABG x4 08/04/2021.  Sternal precautions             -patient may shower but incision must be covered             -ELOS/Goals: 15 days, d/c 9/13            Continue CIR 2.  Antithrombotics: -DVT/anticoagulation:  Pharmaceutical: Lovenox             -antiplatelet therapy: Aspirin 325 mg daily 3. Post-stroke left hand pain: Neurontin 300 mg nightly, Robaxin and tramadol as needed. Continue  Cymbalta '20mg'$  daily. Add B6 '50mg'$  daily to improve sensitivity to pain.  4. Depression: Continue Zoloft 100 mg daily, Xanax as needed.              -antipsychotic agents: N/A 5. Neuropsych: This patient is capable of making decisions on her own behalf. 6. Skin/Wound Care: Routine skin checks 7. Fluids/Electrolytes/Nutrition: Routine in and outs with follow-up chemistries 8.ABLA. Hgb reviewed and stable, monitor weekly.  9.  Diabetes mellitus with peripheral neuropathy.  Hemoglobin A1c 7.8.  DiaBeta 5 mg daily, Glucophage 1000 mg twice daily. Discuss Qutenza 10  Hypertension.  Norvasc 10 mg daily, Lasix 40 mg daily, hydralazine 10 mg nightly, Cozaar 100 mg daily, Lopressor 12.5 mg twice daily.  Monitor with  increased mobility 11.  GERD.  Protonix 12.  Restless leg syndrome.  Requip nightly 13.  Hyperlipidemia.  Lipitor 14.  Obesity.  BMI 30.83.  Dietary follow-up 15.  History of right thalamic infarction 5/22.  Received CIR.  Continue aspirin. 16. Left wrist drop: splint ordered 17. Vitamin D deficiency: start ergocalciferol 50,000U once per week for 7 weeks 18. Suboptimal magnesium: start '250mg'$  magnesium gluconate HS. Continue magnesium oxide '400mg'$  BID    LOS: 2 days A FACE TO FACE EVALUATION WAS PERFORMED  Clide Deutscher [name] 08/13/2021, 10:53  AM

## 2021-08-13 NOTE — Progress Notes (Signed)
Physical Therapy Session Note  Patient Details  Name: [name] MRN: [id number] Date of Birth: [birth year]  Today's Date: 08/13/2021 PT Individual Time: 0900-0953 PT Individual Time Calculation (min): 53 min   Short Term Goals: Week 1:  PT Short Term Goal 1 (Week 1): pt will perform bed mobility with min A consistantly PT Short Term Goal 2 (Week 1): pt will transfer bed<>chair with LRAD and min A PT Short Term Goal 3 (Week 1): pt will ambulate 66f with LRAD and min A  Skilled Therapeutic Interventions/Progress Updates:   Received pt supine in bed with MD present performing morning rounds, pt agreeable to PT treatment, and reported pain in L hand. RN notified and present to administer pain medication. Pt reported not sleeping well last night due to having bad dreams and still feeling tired today. Session with emphasis on functional mobility/transfers, generalized strengthening, dynamic standing balance/coordination, ambulation, simulated car transfers, and improved activity tolerance. Pt requested drink prior to getting up due to complaints of "dry mouth" but agreed to go down to gym with encouragement. Pt transferred supine<>sitting EOB with CGA with HOB elevated with cues to maintain sternal precautions. Stand<>pivot bed<>WC with RW and mod A from slightly elevated bed. [facility] management and energy conservation purposes. Provided pt with R legrest and L hand orthosis for RW and pt requested to go through NM[employer] Technologyto look out windows. Pt transported through NBristol[employer] Squ[employer]bto uplift mood/spirits and pt appreciative. In ortho gym, pt performed simulated car transfer with RW and min/mod A to enter car but max A to stand from low car seat surface to get out. Pt unable to stand with hands in lap resulting in posterior LOB therefore allowed pt to place LUE on RW orthosis for balance but cued pt to avoid pushing/pulling on RW. Pt  ambulated 144fwith RW and min A to mat - cues to decrease speed and for L attention to maintain LUE on RW hand orthosis. Pt "plopping" upon returning to mat stating "I'm exhausted" and requesting to return to bed. Stand<>pivot mat<>WC with RW and min A x 2 additional trials and sit<>supine with CGA. Concluded session with pt supine in bed, needs within reach, and bed alarm on. L wrist splint donned.  Of note, pt continues to require max cues to maintain sternal precautions and demonstrates decreased safety awareness and mild L inattention.   Therapy Documentation Precautions:  Precautions Precautions: Fall, Sternal Restrictions Weight Bearing Restrictions: Yes Other Position/Activity Restrictions: sternal precautions  Therapy/Group: Individual Therapy AnAlfonse AlpersT, DPT   08/13/2021, 7:26 AM

## 2021-08-13 NOTE — Progress Notes (Addendum)
Occupational Therapy Session Note  Patient Details  Name: [name] MRN: [id number] Date of Birth: [birth year]  Today's Date: 08/13/2021 OT Individual Time: 1100-1145 OT Individual Time Calculation (min): 45 min    Short Term Goals: Week 1:  OT Short Term Goal 1 (Week 1): Pt will complete sit > stand with CGA while maintaining sternal precautions OT Short Term Goal 2 (Week 1): Pt will complete LB dressing with min assist while adhering ot sternal precautions OT Short Term Goal 3 (Week 1): Pt will complete toileting (clothing management and hygiene) with CGA while adhering to sternal precautions  Skilled Therapeutic Interventions/Progress Updates:    Pt supine in bed, reporting she just had trouble with her BP dropping in physical therapy.  Confirmed with assigned PT of vitals and she reports TED hose were donned at end of PT session.  Orthostatic vitals assessed at beginning of OT session:  BP: supine: 121/60; seated EOB: 107/83; standing at RW 87/52 (symptomatic with light headedness and drowsiness).  Pt returned to supine where vitals returned to [facility].  Pt agreeable to washing face and brushing teeth and hair sitting EOB.  Supine to sit needing Vcs to follow sternal precautions and min assist for trunk support.  Pt completed all grooming and oral hygiene with setup.  Pt declining all other ADLs and therapeutic measures due to fatigue and requesting back to bed.  Sit to supine with mod assist.  Repositioned using bridge technique BLE with supervision.  Call bell in reach, bed alaarm on at end of session.  Nurse made aware of vitals and symptoms in standing. Pt missed 15 minutes of therapy.  Therapy Documentation Precautions:  Precautions Precautions: Fall, Sternal Restrictions Weight Bearing Restrictions: Yes Other Position/Activity Restrictions: sternal precautions    Therapy/Group: Individual Therapy  Ezekiel Slocumb 08/13/2021, 12:28 PM

## 2021-08-13 NOTE — Plan of Care (Signed)
  Problem: Consults Goal: RH GENERAL PATIENT EDUCATION Description: See Patient Education module for education specifics. Outcome: Progressing   Problem: RH BOWEL ELIMINATION Goal: RH STG MANAGE BOWEL WITH ASSISTANCE Description: STG Manage Bowel with mod I Assistance. Outcome: Progressing Goal: RH STG MANAGE BOWEL W/MEDICATION W/ASSISTANCE Description: STG Manage Bowel with Medication with mod I Assistance. Outcome: Progressing   Problem: RH SKIN INTEGRITY Goal: RH STG SKIN FREE OF INFECTION/BREAKDOWN Description: W min assist Outcome: Progressing Goal: RH STG ABLE TO PERFORM INCISION/WOUND CARE W/ASSISTANCE Description: STG Able To Perform Incision/Wound Care With min Assistance. Outcome: Progressing   Problem: RH SAFETY Goal: RH STG ADHERE TO SAFETY PRECAUTIONS W/ASSISTANCE/DEVICE Description: STG Adhere to Safety Precautions With cues/Assistance/Device. Outcome: Progressing   Problem: RH PAIN MANAGEMENT Goal: RH STG PAIN MANAGED AT OR BELOW PT'S PAIN GOAL Description: At or below level 4 Outcome: Progressing   Problem: RH KNOWLEDGE DEFICIT GENERAL Goal: RH STG INCREASE KNOWLEDGE OF SELF CARE AFTER HOSPITALIZATION Description: Patient will be able to manage care at discharge using handouts and educational resources with cues/reminders Outcome: Progressing

## 2021-08-13 NOTE — Progress Notes (Signed)
Occupational Therapy Session Note  Patient Details  Name: [name] MRN: [id number] Date of Birth: 03/07/1956  Today's Date: 08/13/2021 OT Individual Time: 1407-1500 OT Individual Time Calculation (min): 53 min    Short Term Goals: Week 1:  OT Short Term Goal 1 (Week 1): Pt will complete sit > stand with CGA while maintaining sternal precautions OT Short Term Goal 2 (Week 1): Pt will complete LB dressing with min assist while adhering ot sternal precautions OT Short Term Goal 3 (Week 1): Pt will complete toileting (clothing management and hygiene) with CGA while adhering to sternal precautions  Skilled Therapeutic Interventions/Progress Updates:  Pt greeted supine in bed agreeable to OT intervention. Session focus on  BADL reeducation and functional transfer training. Pt currently requires CGA for bed mobility, pt able to state sternal precautions but requires cues to incorporate precautions functionally into ADLs. Pt completed EOB bathing with set- up assist with pt preferring to only wash UB. MINA needed for UB dressing using compensatory methods to maintain sternal precautions.  Education provided on compensatory methods for UB bathing d/t sternal precautions with pt verbalizing understanding. Remainder of session to focus on functional sit<>stands from EOB, pt required MOD A to stand from heavily elevated EOB with strong posterior lean in standing. Pt required max cues for hand placement each standing trial. Pt completed stand pivot transfer from EOB>w/c with Rw and MIN A once in standing. Pt c/o dizziness in standing however VSS, pt reports she has not eaten today. Pt agreeable to eat jello and a boost shake during session with pt reporting "feeling a little better."pt left supine in bed with bed alarm activated and all needs within reach.               Therapy Documentation Precautions:  Precautions Precautions: Fall, Sternal Restrictions Weight Bearing Restrictions: Yes Other  Position/Activity Restrictions: sternal precautions  Pain: pt reports no pain during session.     Therapy/Group: Individual Therapy  Precious Haws 08/13/2021, 3:54 PM

## 2021-08-13 NOTE — Progress Notes (Signed)
Physical Therapy Session Note  Patient Details  Name: [name] MRN: [id number] Date of Birth: [birth year]  Today's Date: 08/13/2021 PT Individual Time: 1036-1102 PT Individual Time Calculation (min): 26 min   Short Term Goals: Week 1:  PT Short Term Goal 1 (Week 1): pt will perform bed mobility with min A consistantly PT Short Term Goal 2 (Week 1): pt will transfer bed<>chair with LRAD and min A PT Short Term Goal 3 (Week 1): pt will ambulate 72f with LRAD and min A  Skilled Therapeutic Interventions/Progress Updates:    Patient in supine and reports resting and still fatigued from earlier session.  Noted pt with pallor and checked BP as noted below with RN made aware.  Patient up to sit for BP and stood to RW for BP with min A.  Patient returned to supine with S and donned thigh high TEDs.  Performed bridging in supine x 10 reps with 3 sec hold.  C/o light headed so checked SpO2 87% dropping to 85%, RN made aware then back to 93% after cues for deep breaths.  Patient left with RN in the room and needs in reach.   Therapy Documentation Precautions:  Precautions Precautions: Fall, Sternal Restrictions Weight Bearing Restrictions: Yes Other Position/Activity Restrictions: sternal precautions General:   Vital Signs:  Orthostatic VS for the past 24 hrs (Last 3 readings):  BP- Lying BP- Sitting BP- Standing at 0 minutes  08/13/21 1000 115/58 90/61 (!) 78/58    Pain: Pain Assessment Pain Scale: 0-10 Pain Score: 0-No pain    Therapy/Group: Individual Therapy  CReginia NaasCSoudan PT 08/13/2021, 11:00 AM

## 2021-08-14 ENCOUNTER — Other Ambulatory Visit: Payer: Self-pay

## 2021-08-14 LAB — GLUCOSE, CAPILLARY
Glucose-Capillary: 95 mg/dL (ref 70–99)
Glucose-Capillary: 126 mg/dL — ABNORMAL HIGH (ref 70–99)
Glucose-Capillary: 55 mg/dL — ABNORMAL LOW (ref 70–99)
Glucose-Capillary: 96 mg/dL (ref 70–99)
Glucose-Capillary: 62 mg/dL — ABNORMAL LOW (ref 70–99)

## 2021-08-14 NOTE — Progress Notes (Signed)
PROGRESS NOTE   Subjective/Complaints: Sleeping this morning- did not wake her since insomnia has been an issue for her recently, with daytime fatigue affecting her therapy  ROS: +insomnia, +left hand pain   Objective:   No results found. Recent Labs    08/11/21 1549 08/12/21 0713  WBC 11.2* 11.1*  HGB 10.8* 10.4*  HCT 33.0* 31.4*  PLT 412* 380   Recent Labs    08/11/21 1549 08/12/21 0713  NA  --  131*  K  --  4.2  CL  --  90*  CO2  --  31  GLUCOSE  --  200*  BUN  --  12  CREATININE 0.95 1.02*  CALCIUM  --  8.6*    Intake/Output Summary (Last 24 hours) at 08/14/2021 1244 Last data filed at 08/14/2021 0807 Gross per 24 hour  Intake 236 ml  Output 1 ml  Net 235 ml        Physical Exam: Vital Signs Blood pressure (!) 92/46, pulse 75, temperature 98.5 F (36.9 C), resp. rate 16, height '5\' 7"'$  (1.702 m), weight 88.8 kg, SpO2 (!) 89 %. Gen: no distress, normal appearing HEENT: oral mucosa pink and moist, NCAT Cardio: Reg rate Chest: normal effort, normal rate of breathing Abd: soft, non-distended Ext: trace bilateral lower extremity edema Psych: pleasant, normal affect Skin:    Comments: Midline chest incision clean and dry. Mild right thigh bruising Neurological:     Comments: Patient is alert.  No acute distress.  Follows commands and oriented x3.  She recalls her latest inpatient rehab admission. Residual left sided weakness from prior stroke, wrist drop.  Assessment/Plan: 1. Functional deficits which require 3+ hours per day of interdisciplinary therapy in a comprehensive inpatient rehab setting. Physiatrist is providing close team supervision and 24 hour management of active medical problems listed below. Physiatrist and rehab team continue to assess barriers to discharge/monitor patient progress toward functional and medical goals  Care Tool:  Bathing    Body parts bathed by patient: Right arm,  Left arm, Chest, Abdomen, Face   Body parts bathed by helper: Front perineal area, Buttocks     Bathing assist Assist Level: Set up assist     Upper Body Dressing/Undressing Upper body dressing   What is the patient wearing?: Pull over shirt    Upper body assist Assist Level: Minimal Assistance - Patient > 75%    Lower Body Dressing/Undressing Lower body dressing      What is the patient wearing?: Pants, Incontinence brief     Lower body assist Assist for lower body dressing: Maximal Assistance - Patient 25 - 49%     Toileting Toileting    Toileting assist Assist for toileting: Maximal Assistance - Patient 25 - 49%     Transfers Chair/bed transfer  Transfers assist     Chair/bed transfer assist level: Minimal Assistance - Patient > 75%     Locomotion Ambulation   Ambulation assist      Assist level: Moderate Assistance - Patient 50 - 74% Assistive device: Walker-rolling Max distance: 27f   Walk 10 feet activity   Assist     Assist level: Moderate Assistance - Patient -  50 - 74% Assistive device: Walker-rolling   Walk 50 feet activity   Assist Walk 50 feet with 2 turns activity did not occur: Safety/medical concerns (fatigue, LE weakness, decreased balance/postural control, deconditioning)         Walk 150 feet activity   Assist Walk 150 feet activity did not occur: Safety/medical concerns (fatigue, LE weakness, decreased balance/postural control, deconditioning)         Walk 10 feet on uneven surface  activity   Assist Walk 10 feet on uneven surfaces activity did not occur: Safety/medical concerns (fatigue, LE weakness, decreased balance/postural control, deconditioning)         Wheelchair     Assist Is the patient using a wheelchair?: Yes Type of Wheelchair: Manual Wheelchair activity did not occur: Safety/medical concerns (fatigue, LE weakness, decreased balance/postural control, deconditioning)         Wheelchair  50 feet with 2 turns activity    Assist    Wheelchair 50 feet with 2 turns activity did not occur: Safety/medical concerns (fatigue, LE weakness, decreased balance/postural control, deconditioning)       Wheelchair 150 feet activity     Assist  Wheelchair 150 feet activity did not occur: Safety/medical concerns (fatigue, LE weakness, decreased balance/postural control, deconditioning)       Blood pressure (!) 92/46, pulse 75, temperature 98.5 F (36.9 C), resp. rate 16, height '5\' 7"'$  (1.702 m), weight 88.8 kg, SpO2 (!) 89 %.  Medical Problem List and Plan: 1.  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  Status post median sternotomy resection of fibroelastoma from aortic valve/CABG x4 08/04/2021.  Sternal precautions             -patient may shower but incision must be covered             -ELOS/Goals: 15 days, d/c 9/13            Continue CIR 2.  Impaired mobility: -DVT/anticoagulation:  Pharmaceutical: Continue Lovenox             -antiplatelet therapy: Aspirin 325 mg daily 3. Post-stroke left hand pain: Neurontin 300 mg nightly, Robaxin and tramadol as needed. Continue Cymbalta '20mg'$  daily. Add B6 '50mg'$  daily to improve sensitivity to pain.  4. Depression: Continue Zoloft 100 mg daily, Xanax as needed.              -antipsychotic agents: N/A 5. Neuropsych: This patient is capable of making decisions on her own behalf. 6. Skin/Wound Care: Routine skin checks 7. Fluids/Electrolytes/Nutrition: Routine in and outs with follow-up chemistries 8.ABLA. Hgb reviewed and stable, monitor weekly.  9.  Diabetes mellitus with peripheral neuropathy.  Hemoglobin A1c 7.8.  DiaBeta 5 mg daily, Glucophage 1000 mg twice daily. Discuss Qutenza 10  Hypertension.  discontinue Norvasc, continue Lasix 40 mg daily, hydralazine 10 mg nightly, Cozaar 100 mg daily, Lopressor 12.5 mg twice daily.  Monitor with increased mobility 11.  GERD.  Protonix 12.  Restless leg  syndrome.  Requip nightly 13.  Hyperlipidemia.  Lipitor 14.  Obesity.  BMI 30.83.  Dietary follow-up 15.  History of right thalamic infarction 5/22.  Received CIR.  Continue aspirin. 16. Left wrist drop: splint ordered 17. Vitamin D deficiency: start ergocalciferol 50,000U once per week for 7 weeks 18. Suboptimal magnesium: start '250mg'$  magnesium gluconate HS. Continue magnesium oxide '400mg'$  BID    LOS: 3 days A FACE TO FACE EVALUATION WAS PERFORMED  Clide Deutscher Mamie Diiorio 08/14/2021, 12:44 PM

## 2021-08-14 NOTE — IPOC Note (Signed)
Overall Plan of Care [facility]) Patient Details Name: [name] MRN: [id number] DOB: 06-Aug-1956  Admitting Diagnosis: Debility  Hospital Problems: Principal Problem:   Debility     Functional Problem List: Nursing Bladder, Bowel, Edema, Endurance, Medication Management, Motor, Pain, Perception, Safety, Skin Integrity  PT Balance, Endurance, Motor, Nutrition, Pain, Perception, Sensory, Skin Integrity  OT Balance, Endurance, Motor, Pain, Safety  SLP    TR         Basic ADL's: OT Grooming, Bathing, Dressing, Toileting     Advanced  ADL's: OT       Transfers: PT Bed Mobility, Bed to Chair, Car, Manufacturing systems engineer, Metallurgist: PT Ambulation, Emergency planning/management officer, Stairs     Additional Impairments: OT Fuctional Use of Upper Extremity  SLP        TR      Anticipated Outcomes Item Anticipated Outcome  Self Feeding    Swallowing      Basic self-care  Supervision  Toileting  Supervision   Bathroom Transfers Supervision  Bowel/Bladder  Patient will remain continent of bowel and bladder with min assist  Transfers  supervision with LRAD  Locomotion  supervision with LRAD  Communication     Cognition     Pain  pain less than or equal to 4/10 with min assist  Safety/Judgment  patient will be free from falls/injury and displaying apropriate safety judgement   Therapy Plan: PT Intensity: Minimum of 1-2 x/day ,45 to 90 minutes PT Frequency: 5 out of 7 days PT Duration Estimated Length of Stay: 14-16 days OT Intensity: Minimum of 1-2 x/day, 45 to 90 minutes OT Frequency: 5 out of 7 days OT Duration/Estimated Length of Stay: 10-14 days     Due to the current state of emergency, patients may not be receiving their 3-hours of Medicare-mandated therapy.   Team Interventions: Nursing Interventions Patient/Family Education, Bladder Management, Bowel Management, Disease Management/Prevention, Pain Management, Medication Management, Skin  Care/Wound Management, Discharge Planning  PT interventions Ambulation/gait training, Discharge planning, Functional mobility training, Psychosocial support, Therapeutic Activities, Balance/vestibular training, Disease management/prevention, Neuromuscular re-education, Skin care/wound management, Therapeutic Exercise, Wheelchair propulsion/positioning, Cognitive remediation/compensation, DME/adaptive equipment instruction, Pain management, Splinting/orthotics, UE/LE Strength taining/ROM, Community reintegration, Technical sales engineer stimulation, Patient/family education, IT trainer, UE/LE Coordination activities  OT Interventions Training and development officer, Discharge planning, Disease mangement/prevention, DME/adaptive equipment instruction, Functional mobility training, Neuromuscular re-education, Pain management, Patient/family education, Psychosocial support, Self Care/advanced ADL retraining, Skin care/wound managment, Therapeutic Activities, Therapeutic Exercise, UE/LE Strength taining/ROM, UE/LE Coordination activities  SLP Interventions    TR Interventions    SW/CM Interventions Discharge Planning, Psychosocial Support, Patient/Family Education   Barriers to Discharge MD  Medical stability  Nursing Decreased caregiver support, Home environment access/layout, Wound Care, Weight bearing restrictions 1 level 3ste w dtr; has DME  PT Inaccessible home environment, Decreased caregiver support, Home environment access/layout, Weight bearing restrictions 3 STE, daughter can nly provide intermittent assist, difficulty maintaining sternal precautions  OT Weight bearing restrictions sternal precautions  SLP      SW       Team Discharge Planning: Destination: PT-Home ,OT- Home , SLP-  Projected Follow-up: PT-Home health PT, OT-  Home health OT, 24 hour supervision/assistance, SLP-  Projected Equipment Needs: PT-To be determined, OT- None recommended by OT, SLP-  Equipment Details: PT-has RW,  SPC, and WC, OT-pt has tub bench and 3 in1 from previous admission Patient/family involved in discharge planning: PT- Patient,  OT-Patient, SLP-   MD ELOS: 15 days Medical Rehab  Prognosis:  Excellent Assessment: [name] is a 65 year old woman admitted to CIR with cardiac debility. Course complicated by post-stroke left hand pain and weakness. Medications are being managed, and labs and vitals are being monitored regularly.      See Team Conference Notes for weekly updates to the plan of care

## 2021-08-14 NOTE — Progress Notes (Signed)
Late entry to score modified Rankin.  Score based on review of medical record and clinical judgement based on that review.  90 day MRS needed for stroke team.     08/10/21 1529  Modified Rankin (Stroke Patients Only)  Modified Rankin [place], [place]   Acute Rehabilitation Services  Pager [phone] Office [phone] 08/14/2021

## 2021-08-14 NOTE — Patient Outreach (Signed)
[facility]) Care Management  08/14/2021  [name] [birth year] [id number]   3 outreach attempts were completed to obtain mRs. 1st attempt patient was on way to hospital and was not able to contact again once admitted. mRs could not be obtained because patient never returned my calls. mRs=7    [place] Management Assistant [phone]

## 2021-08-14 NOTE — Progress Notes (Signed)
Occupational Therapy Session Note  Patient Details  Name: [name] MRN: [id number] Date of Birth: [birth year]  Today's Date: 08/14/2021 OT Individual Time: [id number] OT Individual Time Calculation (min): 60 min    Short Term Goals: Week 1:  OT Short Term Goal 1 (Week 1): Pt will complete sit > stand with CGA while maintaining sternal precautions OT Short Term Goal 2 (Week 1): Pt will complete LB dressing with min assist while adhering ot sternal precautions OT Short Term Goal 3 (Week 1): Pt will complete toileting (clothing management and hygiene) with CGA while adhering to sternal precautions  Skilled Therapeutic Interventions/Progress Updates:    Pt greeted semi-reclined in bed and agreeable to OT treatment session. OT covered incision with waterproof dressing then wanted to shower. Ambulation with RW with min A, but mod A when turning to sit on commode. Pt voided bladder and was able to complete peri-care with set-up A. Stand-pivot into shower with mod A. Bathing completed with min A overall for LB bathing. Pt reported dizziness and fatigue after shower. PT unable to assist with LB dressing 2/2 fatigue. OT donned TED hose, socks, and pants, then pt stood with min A to pull them up. Verbal cues throughout session for adherence to sternal precautions. Stand-pivot back to bed with min A and BP pressure cuff donned. PT entered room and pt handoff to PT for next therapy session.   Therapy Documentation Precautions:  Precautions Precautions: Fall, Sternal Restrictions Weight Bearing Restrictions: Yes Other Position/Activity Restrictions: sternal precautions Pain:  Denies pain   Therapy/Group: Individual Therapy  Valma Cava 08/14/2021, 3:31 PM

## 2021-08-14 NOTE — Plan of Care (Signed)
  Problem: Consults Goal: RH GENERAL PATIENT EDUCATION Description: See Patient Education module for education specifics. Outcome: Progressing   Problem: RH BOWEL ELIMINATION Goal: RH STG MANAGE BOWEL WITH ASSISTANCE Description: STG Manage Bowel with mod I Assistance. Outcome: Progressing Goal: RH STG MANAGE BOWEL W/MEDICATION W/ASSISTANCE Description: STG Manage Bowel with Medication with mod I Assistance. Outcome: Progressing   Problem: RH SKIN INTEGRITY Goal: RH STG SKIN FREE OF INFECTION/BREAKDOWN Description: W min assist Outcome: Progressing Goal: RH STG ABLE TO PERFORM INCISION/WOUND CARE W/ASSISTANCE Description: STG Able To Perform Incision/Wound Care With min Assistance. Outcome: Progressing   Problem: RH SAFETY Goal: RH STG ADHERE TO SAFETY PRECAUTIONS W/ASSISTANCE/DEVICE Description: STG Adhere to Safety Precautions With cues/Assistance/Device. Outcome: Progressing   Problem: RH PAIN MANAGEMENT Goal: RH STG PAIN MANAGED AT OR BELOW PT'S PAIN GOAL Description: At or below level 4 Outcome: Progressing   Problem: RH KNOWLEDGE DEFICIT GENERAL Goal: RH STG INCREASE KNOWLEDGE OF SELF CARE AFTER HOSPITALIZATION Description: Patient will be able to manage care at discharge using handouts and educational resources with cues/reminders Outcome: Progressing

## 2021-08-14 NOTE — Progress Notes (Signed)
Physical Therapy Session Note  Patient Details  Name: [name] MRN: [id number] Date of Birth: November 11, 1956  Today's Date: 08/14/2021 PT Individual Time: 1045-1130 PT Individual Time Calculation (min): 45 min    Today's Date: 08/14/2021 PT Missed Time: 30 Minutes Missed Time Reason: Patient fatigue  Short Term Goals: Week 1:  PT Short Term Goal 1 (Week 1): pt will perform bed mobility with min A consistantly PT Short Term Goal 2 (Week 1): pt will transfer bed<>chair with LRAD and min A PT Short Term Goal 3 (Week 1): pt will ambulate 34f with LRAD and min A  Skilled Therapeutic Interventions/Progress Updates:   Received pt sitting EOB finishing taking shower with OT and reporting fatigue and dizziness, pt agreeable to PT treatment, and denied any pain during session. Session with emphasis on functional mobility/transfers, generalized strengthening, dynamic standing balance/coordination, ambulation, and improved activity tolerance. BP sitting EOB: 146/105 O2 sat 99% and HR 65bpm. Pt transferred sit<>supine with min A and BP in supine: 112/62 HR 74bpm. Returned to sitting EOB with min A to maintain sternal precautions and took BP again per RN request: 104/58. Sit<>stand with RW and min/mod A. BP standing: 107/92. Pt reporting intermittent dizziness but mainly just "exhaustion". Stand<>pivot bed<>WC with RW and min A. Encouraged going to gym however pt politely declined and requesting to go to giftshop to get hard candy to help with her dry mouth. Pt could not find money and requested to go to [facility] [facility] [facility] [place] and able to withdraw money without assist. [place]. [facility] and with encouragement, ambulated 512fwith RW and CGA to bed and transferred sit<>supine with CGA. Concluded session with pt supine in bed, needs within reach, and bed  alarm on. L wrist splint donned. 30 minutes missed of skilled physical therapy due to fatigue.    Therapy Documentation Precautions:  Precautions Precautions: Fall, Sternal Restrictions Weight Bearing Restrictions: Yes Other Position/Activity Restrictions: sternal precautions  Therapy/Group: Individual Therapy AnAlfonse AlpersT, DPT   08/14/2021, 7:20 AM

## 2021-08-14 NOTE — Progress Notes (Signed)
Physical Therapy Session Note  Patient Details  Name: [name] MRN: [id number] Date of Birth: Mar 14, 1956  Today's Date: 08/14/2021 PT Individual Time: [id number] PT Individual Time Calculation (min): 71 min   Short Term Goals: Week 1:  PT Short Term Goal 1 (Week 1): pt will perform bed mobility with min A consistantly PT Short Term Goal 2 (Week 1): pt will transfer bed<>chair with LRAD and min A PT Short Term Goal 3 (Week 1): pt will ambulate 14f with LRAD and min A  Skilled Therapeutic Interventions/Progress Updates:  Pt received supine in bed, denied pain and was agreeable to PT. Pt performed bed mobility w/supervision and donned shoes at EOB w/set-up assist. Sit <>stand from elevated bed w/CGA and pt ambulated 10' w/RW and supervision to bathroom. Pt voided continently and performed peri care independently. Sit <>[facility] w/CGA and pt ambulated 10' to sink and washed hands w/supervision. Had pt teach back her sternal precautions to ensure understanding, as pt has tendency to pull herself up to stand. Provided education on hand placement for transfers and pt verbalized understanding. Pt transported from room to 1st floor outdoor patio w/total A for time management for holistic therapy and improved mood. Sit <>stands throughout session w/CGA-min A for hand placement and retropulsion correction and pt ambulated 45' x5 w/RW and CGA-min A for steadying assist, verbal cues to maintain safe distance to RW. Noted L knee buckling and kyphotic posture, multimodal cues for increased quad activation during FF and improved scapular retraction. Pt required several minute seated rest break between trials. Transported pt back to room w/total A for time management, sit <>stand w/CGA and stand pivot to EOB w/RW and CGA. Pt performed bed mobility w/min A for LLE management and pt was left supine in bed w/all needs in reach.   Therapy Documentation Precautions:  Precautions Precautions: Fall,  Sternal Restrictions Weight Bearing Restrictions: Yes Other Position/Activity Restrictions: sternal precautions   Therapy/Group: Individual Therapy JCruzita LedererPlaster, PT, DPT  08/14/2021, 7:55 AM

## 2021-08-15 LAB — GLUCOSE, CAPILLARY
Glucose-Capillary: 93 mg/dL (ref 70–99)
Glucose-Capillary: 122 mg/dL — ABNORMAL HIGH (ref 70–99)
Glucose-Capillary: 116 mg/dL — ABNORMAL HIGH (ref 70–99)
Glucose-Capillary: 158 mg/dL — ABNORMAL HIGH (ref 70–99)

## 2021-08-15 MED ORDER — DULOXETINE HCL 20 MG PO CPEP
20.0000 mg | ORAL_CAPSULE | Freq: Every day | ORAL | Status: DC
  Administered 2021-08-15 – 2021-08-16 (×2): 20 mg via ORAL
  Filled 2021-08-15 (×2): qty 1

## 2021-08-15 NOTE — Progress Notes (Signed)
Physical Therapy Session Note  Patient Details  Name: [name] MRN: [id number] Date of Birth: [birth year]  Today's Date: 08/15/2021 PT Individual Time: 0800-0854 PT Individual Time Calculation (min): 54 min   Short Term Goals: Week 1:  PT Short Term Goal 1 (Week 1): pt will perform bed mobility with min A consistantly PT Short Term Goal 2 (Week 1): pt will transfer bed<>chair with LRAD and min A PT Short Term Goal 3 (Week 1): pt will ambulate 18f with LRAD and min A  Skilled Therapeutic Interventions/Progress Updates:   Received pt supine in bed asleep. Therapist provided verbal/tactile/environmental stimuli (turning on lights) to arouse. Pt ultimately agreeable to PT treatment and reported nerve pain in L palm during session; MD notified. Session with emphasis on functional mobility/transfers, generalized strengthening, dynamic standing balance/coordination, gait training, and improved activity tolerance. Donned ted hose and socks in supine with total A and pt transferred supine<>sitting EOB from flat bed with min/mod A for trunk control to maintain sternal precautions. RN present to administer medication and take vitals; BP 112/83 and pt asymptomatic. Sit<>stand from elevated bed with hands on lap and mod A to maintain sternal precautions - L lateral and posterior LOB upon standing. Stand<>pivot bed<>WC with RW and min A with cues to turn with RW completley prior to sitting. Pt transported [facility]C [facility] for time management and energy conservation purposes. Pt transferred on/off Nustep with RW and min A and performed BLE only strengthening on Nustep at workload 3 for 8 minutes for a total of 224 steps with emphasis on cardiovascular endurance. Pt required 4 rest breaks due to fatigue and reports of nausea with 1 mild emesis episode. Pt continues to require max cues to avoid pulling up on RW with UEs when standing and to secure L hand on RW orthosis. Pt ambulated 126fwith RW  and CGA to mat, took seated rest break, then ambulated additional 4068fith RW and CGA/min A. With increased fatigue pt demonstrates L knee bouncing/buckling and requires cues to recognize onset of fatigue and sit to rest rather than "pushing through it" for safety. Pt reported urge to void and ambulated [id number] trials to/from bathroom with RW and min A. Pt able to manage clothing standing with CGA and void and perform peri-care seated with supervision. Pt requested to return to bed and transferred sit<>supine with min A for LLE management. Concluded session with pt supine in bed, needs within reach, and bed alarm on. L wrist splint donned and MD present at bedside for morning rounds.   Therapy Documentation Precautions:  Precautions Precautions: Fall, Sternal Restrictions Weight Bearing Restrictions: Yes Other Position/Activity Restrictions: sternal precautions  Therapy/Group: Individual Therapy AnnaAlfonse Alpers DPT   08/15/2021, 7:24 AM

## 2021-08-15 NOTE — Progress Notes (Signed)
PROGRESS NOTE   Subjective/Complaints: C/o left hand pain, appreciate Vicente Males applying splint She stood and walked 40 feet today!! Commended on progress! She has some chest soreness, wound healing well  ROS: +insomnia, +left hand pain, +chest soreness   Objective:   No results found. No results for input(s): WBC, HGB, HCT, PLT in the last 72 hours.  No results for input(s): NA, K, CL, CO2, GLUCOSE, BUN, CREATININE, CALCIUM in the last 72 hours.   Intake/Output Summary (Last 24 hours) at 08/15/2021 0945 Last data filed at 08/15/2021 0837 Gross per 24 hour  Intake 710 ml  Output --  Net 710 ml        Physical Exam: Vital Signs Blood pressure 112/83, pulse 81, temperature 98.3 F (36.8 C), temperature source Oral, resp. rate 18, height '5\' 7"'$  (1.702 m), weight 86.2 kg, SpO2 97 %. Gen: no distress, normal appearing HEENT: oral mucosa pink and moist, NCAT Cardio: Reg rate Chest: normal effort, normal rate of breathing Abd: soft, non-distended Ext: trace bilateral lower extremity edema Psych: pleasant, normal affect Skin:    Comments: Midline chest incision clean and dry. Mild right thigh bruising Neurological:     Comments: Patient is alert.  No acute distress.  Follows commands and oriented x3.  She recalls her latest inpatient rehab admission. Residual left sided weakness from prior stroke, wrist drop.  Assessment/Plan: 1. Functional deficits which require 3+ hours per day of interdisciplinary therapy in a comprehensive inpatient rehab setting. Physiatrist is providing close team supervision and 24 hour management of active medical problems listed below. Physiatrist and rehab team continue to assess barriers to discharge/monitor patient progress toward functional and medical goals  Care Tool:  Bathing    Body parts bathed by patient: Right arm, Left arm, Chest, Abdomen, Face   Body parts bathed by helper: Front  perineal area, Buttocks     Bathing assist Assist Level: Set up assist     Upper Body Dressing/Undressing Upper body dressing   What is the patient wearing?: Pull over shirt    Upper body assist Assist Level: Minimal Assistance - Patient > 75%    Lower Body Dressing/Undressing Lower body dressing      What is the patient wearing?: Pants, Incontinence brief     Lower body assist Assist for lower body dressing: Maximal Assistance - Patient 25 - 49%     Toileting Toileting    Toileting assist Assist for toileting: Maximal Assistance - Patient 25 - 49%     Transfers Chair/bed transfer  Transfers assist     Chair/bed transfer assist level: Minimal Assistance - Patient > 75%     Locomotion Ambulation   Ambulation assist      Assist level: Minimal Assistance - Patient > 75% Assistive device: Walker-rolling Max distance: 42f   Walk 10 feet activity   Assist     Assist level: Minimal Assistance - Patient > 75% Assistive device: Walker-rolling   Walk 50 feet activity   Assist Walk 50 feet with 2 turns activity did not occur: Safety/medical concerns (fatigue, LE weakness, decreased balance/postural control, deconditioning)         Walk 150 feet activity   Assist  Walk 150 feet activity did not occur: Safety/medical concerns (fatigue, LE weakness, decreased balance/postural control, deconditioning)         Walk 10 feet on uneven surface  activity   Assist Walk 10 feet on uneven surfaces activity did not occur: Safety/medical concerns (fatigue, LE weakness, decreased balance/postural control, deconditioning)         Wheelchair     Assist Is the patient using a wheelchair?: Yes Type of Wheelchair: Manual Wheelchair activity did not occur: Safety/medical concerns (fatigue, LE weakness, decreased balance/postural control, deconditioning)         Wheelchair 50 feet with 2 turns activity    Assist    Wheelchair 50 feet with 2  turns activity did not occur: Safety/medical concerns (fatigue, LE weakness, decreased balance/postural control, deconditioning)       Wheelchair 150 feet activity     Assist  Wheelchair 150 feet activity did not occur: Safety/medical concerns (fatigue, LE weakness, decreased balance/postural control, deconditioning)       Blood pressure 112/83, pulse 81, temperature 98.3 F (36.8 C), temperature source Oral, resp. rate 18, height '5\' 7"'$  (1.702 m), weight 86.2 kg, SpO2 97 %.  Medical Problem List and Plan: 1.  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  Status post median sternotomy resection of fibroelastoma from aortic valve/CABG x4 08/04/2021.  Sternal precautions             -patient may shower but incision must be covered             -ELOS/Goals: 15 days, d/c 9/13            Continue CIR 2.  Impaired mobility, ambulating 40 feet -DVT/anticoagulation:  Pharmaceutical: Continue Lovenox             -antiplatelet therapy: Aspirin 325 mg daily 3. Post-stroke left hand pain: Neurontin 300 mg nightly, Robaxin and tramadol as needed. Continue Cymbalta '20mg'$  daily. Add B6 '50mg'$  daily to improve sensitivity to pain.  4. Depression: Continue Zoloft 100 mg daily, Xanax as needed.              -antipsychotic agents: N/A 5. Neuropsych: This patient is capable of making decisions on her own behalf. 6. Skin/Wound Care: Routine skin checks 7. Fluids/Electrolytes/Nutrition: Routine in and outs with follow-up chemistries 8.ABLA. Hgb reviewed and stable, monitor weekly.  9.  Diabetes mellitus with peripheral neuropathy.  Hemoglobin A1c 7.8.  DiaBeta 5 mg daily, Glucophage 1000 mg twice daily. Discuss Qutenza 10  Hypertension.  discontinue Norvasc, continue Lasix 40 mg daily, hydralazine 10 mg nightly, Cozaar 100 mg daily, Lopressor 12.5 mg twice daily.  Monitor with increased mobility 11.  GERD.  Protonix 12.  Restless leg syndrome.  Requip nightly 13.  Hyperlipidemia.   Lipitor 14.  Obesity.  BMI 30.83.  Dietary follow-up 15.  History of right thalamic infarction 5/22.  Received CIR.  Continue aspirin. 16. Left wrist drop: splint ordered 17. Vitamin D deficiency: start ergocalciferol 50,000U once per week for 7 weeks 18. Suboptimal magnesium: start '250mg'$  magnesium gluconate HS. Continue magnesium oxide '400mg'$  BID    LOS: 4 days A FACE TO FACE EVALUATION WAS PERFORMED  [name] P Janeya Deyo 08/15/2021, 9:45 AM

## 2021-08-15 NOTE — Progress Notes (Signed)
Occupational Therapy Session Note  Patient Details  Name: [name] MRN: [id number] Date of Birth: [birth year]  Today's Date: 08/16/2021 OT Individual Time: 4098-1191 and 4782-9562 OT Individual Time Calculation (min): 58 min and 28 min    Skilled Therapeutic Interventions/Progress Updates:    Pt greeted in bed with NT at bedside. Pt reported she almost passed out in the bathroom with staff because she got dizzy. Was not wearing Teds, no c/o pain. She wanted to take a shower. Agreeable to engage in bathing/dressing tasks at EOB level for safety today. Mod A and vcs for sternal precaution adherence while transitioning to EOB. She needed min cues for precautions during UB self care. Pt opting to return to bed to complete LB self care due to fatigue/dizziness. Min A for rolling Rt>Lt and for washing feet. Total A for Teds and Lt gripper sock. Pt able to utilize reclined figure 4 position bilaterally. She transitioned EOB x2 more times to complete oral care and then hair brushing. Pt limited with meeting FM demands during several tasks due to residual Lt hemiparesis. Note that pt took several supine rest breaks to manage fatigue/dizziness today. No c/o pain. She was able to boost herself up in bed while OT stabilized B LEs. Pt remained comfortably in bed at close of session, all needs within reach and bed alarm set. Tx focus placed on ADL retraining, activity tolerance, and precaution adherence during functional activity.  2nd Session 1:1 tx (28 min) Pt greeted in bed with no c/o pain. ADL needs met, agreeable to work on her Lt hand during session to improve bimanual coordination during self care tasks. Supine<sit completed with Min A and vcs, OT assisting with trunk elevation while standing behind pt. While sitting EOB, pt was set up with a pegboard task using chalk-shaped pegs. Pt with multiple episodes of dropping and utilized truncal compensatory strategies at times. Pt with weak grasp and poor  ability to manipulate pegs in her affected Lt hand. At end of session pt returned to bed. Left her with all needs within reach and bed alarm set.   Therapy Documentation Precautions:  Precautions Precautions: Fall, Sternal Restrictions Weight Bearing Restrictions: No Other Position/Activity Restrictions: sternal precautions General:    ADL: ADL Upper Body Bathing: Supervision/safety, Contact guard Where Assessed-Upper Body Bathing: Edge of bed Lower Body Bathing: Moderate assistance Where Assessed-Lower Body Bathing: Edge of bed Upper Body Dressing: Minimal assistance Where Assessed-Upper Body Dressing: Edge of bed Lower Body Dressing: Moderate assistance Where Assessed-Lower Body Dressing: Edge of bed Toileting: Maximal assistance Where Assessed-Toileting: Bedside Commode Toilet Transfer: Moderate assistance Toilet Transfer Method: Stand pivot Toilet Transfer Equipment: Bedside commode     Therapy/Group: Individual Therapy  [name] A Roselani Grajeda 08/16/2021, 12:19 PM

## 2021-08-15 NOTE — Progress Notes (Signed)
Physical Therapy Session Note  Patient Details  Name: [name] MRN: [id number] Date of Birth: [birth year]  Today's Date: 08/15/2021 PT Individual Time: 1110-1200 PT Individual Time Calculation (min): 50 min   Short Term Goals: Week 1:  PT Short Term Goal 1 (Week 1): pt will perform bed mobility with min A consistantly PT Short Term Goal 2 (Week 1): pt will transfer bed<>chair with LRAD and min A PT Short Term Goal 3 (Week 1): pt will ambulate 26f with LRAD and min A  Skilled Therapeutic Interventions/Progress Updates:   PAIN c/o L hand pain, reports OT taped hand this am and hopefull this might help, care taken during session to handle limb w/care.  Repositioning as needed.   Pt initially supine and agreeable to session. Therex w/focus on LLE strengthening/NMRE: Bridging w/cues for pacing/focus on stabilization on LLE w/task 2/12 Sidelying L clamshells 2 x15 SLRs 2x13 Hip and knee flexion/extension 2x15    Supine to sit w/cues for sternal precautions/sequencing. Pt initially c/o dizzyness, BP 107/68 Seated LAQs and ankle pumps Sit to stand w/RW w/cga. Gait 652fw/RW, cues to attend to L, decreased celarance LLE thru swing.  Repeated Sit to stand x5 w/cues for foot placement/attention to [place].    Pt sit to long sit to supine, sit to/from supine mult times during session, cues for sternal precautions.  Pt somewhat impulsive.  BP stable but pt c/o mild dizzyness w/supine to sit/initially in sitting but resolves rapidly.   Therapy Documentation Precautions:  Precautions Precautions: Fall, Sternal Restrictions Weight Bearing Restrictions: Yes Other Position/Activity Restrictions: sternal precautions    Therapy/Group: Individual Therapy BaCallie FieldingPTW[place]/01/2021, 12:20 PM

## 2021-08-15 NOTE — Plan of Care (Signed)
  Problem: Consults Goal: RH GENERAL PATIENT EDUCATION Description: See Patient Education module for education specifics. Outcome: Progressing   Problem: RH BOWEL ELIMINATION Goal: RH STG MANAGE BOWEL WITH ASSISTANCE Description: STG Manage Bowel with mod I Assistance. Outcome: Progressing Goal: RH STG MANAGE BOWEL W/MEDICATION W/ASSISTANCE Description: STG Manage Bowel with Medication with mod I Assistance. Outcome: Progressing   Problem: RH SKIN INTEGRITY Goal: RH STG SKIN FREE OF INFECTION/BREAKDOWN Description: W min assist Outcome: Progressing Goal: RH STG ABLE TO PERFORM INCISION/WOUND CARE W/ASSISTANCE Description: STG Able To Perform Incision/Wound Care With min Assistance. Outcome: Progressing   Problem: RH SAFETY Goal: RH STG ADHERE TO SAFETY PRECAUTIONS W/ASSISTANCE/DEVICE Description: STG Adhere to Safety Precautions With cues/Assistance/Device. Outcome: Progressing   Problem: RH PAIN MANAGEMENT Goal: RH STG PAIN MANAGED AT OR BELOW PT'S PAIN GOAL Description: At or below level 4 Outcome: Progressing   Problem: RH KNOWLEDGE DEFICIT GENERAL Goal: RH STG INCREASE KNOWLEDGE OF SELF CARE AFTER HOSPITALIZATION Description: Patient will be able to manage care at discharge using handouts and educational resources with cues/reminders Outcome: Progressing

## 2021-08-15 NOTE — Progress Notes (Signed)
Occupational Therapy Session Note  Patient Details  Name: [name] MRN: [id number] Date of Birth: [birth year]  Today's Date: 08/15/2021 OT Individual Time: 1435-1445 OT Individual Time Calculation (min): 10 min  and Today's Date: 08/15/2021 OT Missed Time: 20 Minutes Missed Time Reason: Patient fatigue   Short Term Goals: Week 1:  OT Short Term Goal 1 (Week 1): Pt will complete sit > stand with CGA while maintaining sternal precautions OT Short Term Goal 2 (Week 1): Pt will complete LB dressing with min assist while adhering ot sternal precautions OT Short Term Goal 3 (Week 1): Pt will complete toileting (clothing management and hygiene) with CGA while adhering to sternal precautions  Skilled Therapeutic Interventions/Progress Updates:    Pt received dozing in bed, easily awakened initially but dozing off during session.  Therapist assessed fit of kinesiotape, noted peeling around wrist anchor.  Therapist secured kinesiotape and discussed importance of elevating LUE when at rest.  Pt dozing off throughout session and when therapist exited to gather items pt asleep with decreased arousal, therefore pt missed remaining 20 mins of session.  Therapy Documentation Precautions:  Precautions Precautions: Fall, Sternal Restrictions Weight Bearing Restrictions: Yes Other Position/Activity Restrictions: sternal precautions General: General OT Amount of Missed Time: 20 Minutes Vital Signs: Therapy Vitals Temp: 97.7 F (36.5 C) Pulse Rate: 68 Resp: 17 BP: (!) 108/56 Patient Position (if appropriate): Lying Oxygen Therapy SpO2: 94 % O2 Device: Room Air Pain: Pt with c/o pain in L hand, not rated.   Therapy/Group: Individual Therapy  Simonne Come 08/15/2021, 3:23 PM

## 2021-08-15 NOTE — Progress Notes (Signed)
Occupational Therapy Session Note  Patient Details  Name: [name] MRN: [id number] Date of Birth: [birth year]  Today's Date: 08/15/2021 OT Individual Time: [id number] OT Individual Time Calculation (min): 54 min    Short Term Goals: Week 1:  OT Short Term Goal 1 (Week 1): Pt will complete sit > stand with CGA while maintaining sternal precautions OT Short Term Goal 2 (Week 1): Pt will complete LB dressing with min assist while adhering ot sternal precautions OT Short Term Goal 3 (Week 1): Pt will complete toileting (clothing management and hygiene) with CGA while adhering to sternal precautions  Skilled Therapeutic Interventions/Progress Updates:    Treatment session with focus on LUE pain management and functional use.  Pt received supine in bed reporting fatigue from previous session and reports some nausea earlier.  Pt declined leaving room but asking to address LUE pain.  Therapist directed pt in thumb opposition and squeeze on foam blocks with pt reporting decreased flexion in 2nd and 3rd digits with increased pain with attempts at flexion.  Therapist provided retrograde massage to ditis and applied kinesiotape to 2-4 digits to facilitate edema reduction.  Pt returned to supine due to fatigue and left semi-reclined with all needs in reach and LUE elevated on pillow for edema management.  Therapy Documentation Precautions:  Precautions Precautions: Fall, Sternal Restrictions Weight Bearing Restrictions: Yes Other Position/Activity Restrictions: sternal precautions  Pain:  Pt with c/o pain in L hand, not rated.  Premedicated.   Therapy/Group: Individual Therapy  Simonne Come 08/15/2021, 12:17 PM

## 2021-08-16 LAB — GLUCOSE, CAPILLARY
Glucose-Capillary: 179 mg/dL — ABNORMAL HIGH (ref 70–99)
Glucose-Capillary: 95 mg/dL (ref 70–99)
Glucose-Capillary: 174 mg/dL — ABNORMAL HIGH (ref 70–99)
Glucose-Capillary: 119 mg/dL — ABNORMAL HIGH (ref 70–99)

## 2021-08-16 MED ORDER — AMLODIPINE BESYLATE 2.5 MG PO TABS
2.5000 mg | ORAL_TABLET | Freq: Every day | ORAL | Status: DC
  Administered 2021-08-16 – 2021-08-22 (×7): 2.5 mg via ORAL
  Filled 2021-08-16 (×7): qty 1

## 2021-08-16 MED ORDER — GABAPENTIN 100 MG PO CAPS
100.0000 mg | ORAL_CAPSULE | Freq: Two times a day (BID) | ORAL | Status: DC
  Administered 2021-08-16 – 2021-08-18 (×4): 100 mg via ORAL
  Filled 2021-08-16 (×4): qty 1

## 2021-08-16 NOTE — Plan of Care (Signed)
  Problem: Consults Goal: RH GENERAL PATIENT EDUCATION Description: See Patient Education module for education specifics. Outcome: Progressing   Problem: RH BOWEL ELIMINATION Goal: RH STG MANAGE BOWEL WITH ASSISTANCE Description: STG Manage Bowel with mod I Assistance. Outcome: Progressing Goal: RH STG MANAGE BOWEL W/MEDICATION W/ASSISTANCE Description: STG Manage Bowel with Medication with mod I Assistance. Outcome: Progressing   Problem: RH SKIN INTEGRITY Goal: RH STG SKIN FREE OF INFECTION/BREAKDOWN Description: W min assist Outcome: Progressing Goal: RH STG ABLE TO PERFORM INCISION/WOUND CARE W/ASSISTANCE Description: STG Able To Perform Incision/Wound Care With min Assistance. Outcome: Progressing   Problem: RH SAFETY Goal: RH STG ADHERE TO SAFETY PRECAUTIONS W/ASSISTANCE/DEVICE Description: STG Adhere to Safety Precautions With cues/Assistance/Device. Outcome: Progressing   Problem: RH PAIN MANAGEMENT Goal: RH STG PAIN MANAGED AT OR BELOW PT'S PAIN GOAL Description: At or below level 4 Outcome: Progressing   Problem: RH KNOWLEDGE DEFICIT GENERAL Goal: RH STG INCREASE KNOWLEDGE OF SELF CARE AFTER HOSPITALIZATION Description: Patient will be able to manage care at discharge using handouts and educational resources with cues/reminders Outcome: Progressing

## 2021-08-16 NOTE — Progress Notes (Signed)
PROGRESS NOTE   Subjective/Complaints: Continues to complain of left hand pain- asks about purchasing paraffin wax at home and I think this is a great idea. Experiencing dry mouth- discussed that could be side effect of Cymbalta  ROS: +insomnia, +left hand pain, +chest soreness, +dry mouth   Objective:   No results found. No results for input(s): WBC, HGB, HCT, PLT in the last 72 hours.  No results for input(s): NA, K, CL, CO2, GLUCOSE, BUN, CREATININE, CALCIUM in the last 72 hours.   Intake/Output Summary (Last 24 hours) at 08/16/2021 1602 Last data filed at 08/16/2021 0934 Gross per 24 hour  Intake 240 ml  Output --  Net 240 ml        Physical Exam: Vital Signs Blood pressure (!) 103/92, pulse 86, temperature 98 F (36.7 C), temperature source Oral, resp. rate 17, height '5\' 7"'$  (1.702 m), weight 86.2 kg, SpO2 97 %. Gen: no distress, normal appearing HEENT: oral mucosa pink and moist, NCAT Cardio: Reg rate Chest: normal effort, normal rate of breathing Abd: soft, non-distended Musculoskeletal:  Ext: trace bilateral lower extremity edema Psych: pleasant, normal affect Skin:    Comments: Midline chest incision clean and dry. Mild right thigh bruising Neurological:     Comments: Patient is alert.  No acute distress.  Follows commands and oriented x3.  She recalls her latest inpatient rehab admission. Residual left sided weakness from prior stroke, wrist drop.  Assessment/Plan: 1. Functional deficits which require 3+ hours per day of interdisciplinary therapy in a comprehensive inpatient rehab setting. Physiatrist is providing close team supervision and 24 hour management of active medical problems listed below. Physiatrist and rehab team continue to assess barriers to discharge/monitor patient progress toward functional and medical goals  Care Tool:  Bathing    Body parts bathed by patient: Right arm, Left arm,  Chest, Abdomen, Face, Front perineal area, Buttocks, Right upper leg, Left upper leg   Body parts bathed by helper: Right lower leg, Left lower leg     Bathing assist Assist Level: Minimal Assistance - Patient > 75%     Upper Body Dressing/Undressing Upper body dressing   What is the patient wearing?: Pull over shirt    Upper body assist Assist Level: Supervision/Verbal cueing    Lower Body Dressing/Undressing Lower body dressing      What is the patient wearing?: Pants     Lower body assist Assist for lower body dressing: Supervision/Verbal cueing (bedlevel)     Toileting Toileting    Toileting assist Assist for toileting: Maximal Assistance - Patient 25 - 49%     Transfers Chair/bed transfer  Transfers assist     Chair/bed transfer assist level: Moderate Assistance - Patient 50 - 74%     Locomotion Ambulation   Ambulation assist      Assist level: Moderate Assistance - Patient 50 - 74% Assistive device: Walker-rolling (also L hand splint) Max distance: 60   Walk 10 feet activity   Assist     Assist level: Moderate Assistance - Patient - 50 - 74% Assistive device: Walker-rolling   Walk 50 feet activity   Assist Walk 50 feet with 2 turns activity did not  occur: Safety/medical concerns (fatigue, LE weakness, decreased balance/postural control, deconditioning)  Assist level: Moderate Assistance - Patient - 50 - 74%      Walk 150 feet activity   Assist Walk 150 feet activity did not occur: Safety/medical concerns (fatigue, LE weakness, decreased balance/postural control, deconditioning)         Walk 10 feet on uneven surface  activity   Assist Walk 10 feet on uneven surfaces activity did not occur: Safety/medical concerns (fatigue, LE weakness, decreased balance/postural control, deconditioning)         Wheelchair     Assist Is the patient using a wheelchair?: Yes Type of Wheelchair: Manual Wheelchair activity did not occur:  Safety/medical concerns (fatigue, LE weakness, decreased balance/postural control, deconditioning)         Wheelchair 50 feet with 2 turns activity    Assist    Wheelchair 50 feet with 2 turns activity did not occur: Safety/medical concerns (fatigue, LE weakness, decreased balance/postural control, deconditioning)       Wheelchair 150 feet activity     Assist  Wheelchair 150 feet activity did not occur: Safety/medical concerns (fatigue, LE weakness, decreased balance/postural control, deconditioning)       Blood pressure (!) 103/92, pulse 86, temperature 98 F (36.7 C), temperature source Oral, resp. rate 17, height '5\' 7"'$  (1.702 m), weight 86.2 kg, SpO2 97 %.  Medical Problem List and Plan: 1.  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  Status post median sternotomy resection of fibroelastoma from aortic valve/CABG x4 08/04/2021.  Sternal precautions             -patient may shower but incision must be covered             -ELOS/Goals: 15 days, d/c 9/13         Continue CIR 2.  Impaired mobility, ambulating 40 feet -DVT/anticoagulation:  Pharmaceutical: Continue Lovenox             -antiplatelet therapy: Aspirin 325 mg daily 3. Post-stroke left hand pain: Add Gabapentin '100mg'$  BID and continue '400mg'$  HS. Robaxin and tramadol as needed. Add B6 '50mg'$  daily to improve sensitivity to pain.  4. Depression: Continue Zoloft 100 mg daily, Xanax as needed.              -antipsychotic agents: N/A 5. Neuropsych: This patient is capable of making decisions on her own behalf. 6. Skin/Wound Care: Routine skin checks 7. Fluids/Electrolytes/Nutrition: Routine in and outs with follow-up chemistries 8.ABLA. Hgb reviewed and stable, monitor weekly.  9.  Diabetes mellitus with peripheral neuropathy.  Hemoglobin A1c 7.8.  DiaBeta 5 mg daily, Glucophage 1000 mg twice daily. Discuss Qutenza 10  Hypertension.  restart norvasc 2.'5mg'$ , continue Lasix 40 mg daily,  hydralazine 10 mg nightly, Cozaar 100 mg daily, Lopressor 12.5 mg twice daily.  Monitor with increased mobility 11.  GERD.  Protonix 12.  Restless leg syndrome.  Requip nightly 13.  Hyperlipidemia.  Lipitor 14.  Obesity.  BMI 30.83.  Dietary follow-up 15.  History of right thalamic infarction 5/22.  Received CIR.  Continue aspirin. 16. Left wrist drop: splint ordered 17. Vitamin D deficiency: start ergocalciferol 50,000U once per week for 7 weeks 18. Suboptimal magnesium: start '250mg'$  magnesium gluconate HS. Continue magnesium oxide '400mg'$  BID 19. Dry mouth: stop Cymbalta.     LOS: 5 days A FACE TO FACE EVALUATION WAS PERFORMED  [name] 08/16/2021, 4:02 PM

## 2021-08-16 NOTE — Progress Notes (Addendum)
Physical Therapy Session Note  Patient Details  Name: [name] MRN: [id number] Date of Birth: July 29, 1956  Today's Date: 08/16/2021 PT Individual Time: 0800-0900; 1302-1330 PT Individual Time Calculation (min): 60 min , 28 min  Short Term Goals: Week 1:  PT Short Term Goal 1 (Week 1): pt will perform bed mobility with min A consistantly PT Short Term Goal 2 (Week 1): pt will transfer bed<>chair with LRAD and min A PT Short Term Goal 3 (Week 1): pt will ambulate 29f with LRAD and min A  Skilled Therapeutic Interventions/Progress Updates:  Tx 1:  Pt asleep in bed, but easily awakened.  She denied pain.  Using bed features, supine> sit with min assist and cues for sternal precautions.  She denied dizziness.  Sit> stand from raised bed to RW, CgA and cues for technique. PT donned bil non slip socks.  Gait training in congested room with RW , min assist and mod cues for LLE positioning.  Pt tends to place L foot far to L side within RW.  Toilet transfer with CGA until turn with mod assist for balance.  Pt continent of bladder; peri care with supervision.  Hand washing at sink in standing, CGA. With set-up, pt brushed her hair; PT put in ponytail holder.   Seated in wc- neuromuscular re-education via demo and multimodal cues for bil heel raises, R/L long arc quad knee extension with ankle pumps at end range. Seated in wc- use of Kinetron for alternating reciprocal movement bil LEs at resistance 50 cm/sec x 25 cycles, using LLE only with resistance provided by PT on other footplate, x 25 cycles x 2.  Pt mildly SOB after q bout of 25 cycles.  Sit> stand with mod assist, pt's hands on knees.  Stand> sit with min assist and poor eccentric control.  She reported LLE weakness after nmr above.  Gait training on level tile x 60' including 2 turns, min> mod assist during turn to sit, RW.  L knee buckled near end.  Distance limited by fatigue. Mod cues for hand placmeent sit>< stand for sternal  precautions.  Sit> stand and turn to sit on bed with mod assist, bil hands on knees initially. At end of session, pt resting in bed with needs at hand and alarm set.  Tx 2:  Pt resting in bed.  She denied pain.   neuromuscular re-education via demo and multimodal cues for supine- R/L 2 x 10 straight leg raises focusing on eccentric control, bil bridging, bil bridging with adductor squeeze against towel roll focusing on smoothness of movement, alternating R/L ankle pumps.  Supine> sit in flat bed, no rails with min assist to observe sternal precautions.  Sit> stand required 2 attempts, with mod assist.  Gait training on level tile x 75' with RW, CGA.  Min assist for turn L.  Distance limited by fatigue.  Stand> sit with CGa, max cues for safe hand placement. Sit> supine with supervision. At end of session, pt resting in bed with needs at hand and alarm set.     Therapy Documentation Precautions:  Precautions Precautions: Fall, Sternal Restrictions Weight Bearing Restrictions: Yes (sternal precautions) Other Position/Activity Restrictions: sternal precautions      Therapy/Group: Individual Therapy  Nikoli Nasser 08/16/2021, 8:57 AM

## 2021-08-17 LAB — GLUCOSE, CAPILLARY
Glucose-Capillary: 136 mg/dL — ABNORMAL HIGH (ref 70–99)
Glucose-Capillary: 131 mg/dL — ABNORMAL HIGH (ref 70–99)
Glucose-Capillary: 95 mg/dL (ref 70–99)
Glucose-Capillary: 105 mg/dL — ABNORMAL HIGH (ref 70–99)

## 2021-08-17 NOTE — Progress Notes (Signed)
Occupational Therapy Session Note  Patient Details  Name: [name] MRN: [id number] Date of Birth: 12-09-1956  Today's Date: 08/18/2021 OT Individual Time: 0900-0959 OT Individual Time Calculation (min): 59 min   Skilled Therapeutic Interventions/Progress Updates:    Pt greeted in bed with no c/o pain, throat a bit sore from taking her medicine but manageable. She was motivated to shower this morning. Vcs and CGA for supine<sit for sternal precaution adherence (flat bed without bedrail). CGA for ambulatory transfer first to toilet using RW (+bladder void). Pt doffed her clothing at sit<stand level and then transitioned to the shower, Min A for stepping over wide threshold. OT covered her chest incision and then pt bathed at seated level with supervision. Note that she dropped the wash cloth several times with her Lt hand, able to use her Lt hand to wash Rt underarm. Dressing was then completed EOB at sit<stand level using device. Pt needed Mod-Max A for LB dressing due to discomfort with bending forward, pt attributed this to recently taking her medicine. She returned to bed at close of session, all needs within reach and bed alarm set. Tx focus placed on ADL retraining, activity tolerance (pt reporting she was "wore out" post ADL), dynamic balance, and sternal precaution adherence during functional activity.   Therapy Documentation Precautions:  Precautions Precautions: Fall, Sternal Restrictions Weight Bearing Restrictions: No Other Position/Activity Restrictions: sternal precautions  Pain Assessment Pain Scale: 0-10 Pain Score: 0-No pain ADL: ADL Upper Body Bathing: Supervision/safety, Contact guard Where Assessed-Upper Body Bathing: Edge of bed Lower Body Bathing: Moderate assistance Where Assessed-Lower Body Bathing: Edge of bed Upper Body Dressing: Minimal assistance Where Assessed-Upper Body Dressing: Edge of bed Lower Body Dressing: Moderate assistance Where  Assessed-Lower Body Dressing: Edge of bed Toileting: Maximal assistance Where Assessed-Toileting: Bedside Commode Toilet Transfer: Moderate assistance Toilet Transfer Method: Stand pivot Toilet Transfer Equipment: Bedside commode     Therapy/Group: Individual Therapy  Zykeria Laguardia A Jina Olenick 08/18/2021, 12:22 PM

## 2021-08-17 NOTE — Plan of Care (Signed)
  Problem: Consults Goal: RH GENERAL PATIENT EDUCATION Description: See Patient Education module for education specifics. Outcome: Progressing   Problem: RH BOWEL ELIMINATION Goal: RH STG MANAGE BOWEL WITH ASSISTANCE Description: STG Manage Bowel with mod I Assistance. Outcome: Progressing Goal: RH STG MANAGE BOWEL W/MEDICATION W/ASSISTANCE Description: STG Manage Bowel with Medication with mod I Assistance. Outcome: Progressing   Problem: RH SKIN INTEGRITY Goal: RH STG SKIN FREE OF INFECTION/BREAKDOWN Description: W min assist Outcome: Progressing Goal: RH STG ABLE TO PERFORM INCISION/WOUND CARE W/ASSISTANCE Description: STG Able To Perform Incision/Wound Care With min Assistance. Outcome: Progressing   Problem: RH SAFETY Goal: RH STG ADHERE TO SAFETY PRECAUTIONS W/ASSISTANCE/DEVICE Description: STG Adhere to Safety Precautions With cues/Assistance/Device. Outcome: Progressing   Problem: RH PAIN MANAGEMENT Goal: RH STG PAIN MANAGED AT OR BELOW PT'S PAIN GOAL Description: At or below level 4 Outcome: Progressing   Problem: RH KNOWLEDGE DEFICIT GENERAL Goal: RH STG INCREASE KNOWLEDGE OF SELF CARE AFTER HOSPITALIZATION Description: Patient will be able to manage care at discharge using handouts and educational resources with cues/reminders Outcome: Progressing

## 2021-08-18 LAB — GLUCOSE, CAPILLARY
Glucose-Capillary: 133 mg/dL — ABNORMAL HIGH (ref 70–99)
Glucose-Capillary: 108 mg/dL — ABNORMAL HIGH (ref 70–99)
Glucose-Capillary: 127 mg/dL — ABNORMAL HIGH (ref 70–99)
Glucose-Capillary: 77 mg/dL (ref 70–99)

## 2021-08-18 MED ORDER — GABAPENTIN 300 MG PO CAPS
600.0000 mg | ORAL_CAPSULE | Freq: Every day | ORAL | Status: DC
  Administered 2021-08-18: 600 mg via ORAL
  Filled 2021-08-18: qty 2

## 2021-08-18 MED ORDER — GABAPENTIN 100 MG PO CAPS
200.0000 mg | ORAL_CAPSULE | Freq: Two times a day (BID) | ORAL | Status: DC
  Administered 2021-08-19 – 2021-08-21 (×5): 200 mg via ORAL
  Filled 2021-08-18 (×5): qty 2

## 2021-08-18 NOTE — Progress Notes (Addendum)
Physical Therapy Session Note  Patient Details  Name: [name] MRN: [id number] Date of Birth: May 04, 1956  Today's Date: 08/18/2021 PT Individual Time: 4680-3212 PT Individual Time Calculation (min): 38 min   Short Term Goals: Week 1:  PT Short Term Goal 1 (Week 1): pt will perform bed mobility with min A consistantly PT Short Term Goal 2 (Week 1): pt will transfer bed<>chair with LRAD and min A PT Short Term Goal 3 (Week 1): pt will ambulate 66f with LRAD and min A  Skilled Therapeutic Interventions/Progress Updates:   Pt received supine in bed and agreeable to PT at bed level. PT obtained bolster, tband and 4# ankle weights. Bed level therex: SAQ with 4# ankle weight 2 x 12. SLR 1 x 12 with 4# ankle weight. Clam shell with level 2 tband 2 x 12. Hip flexion with 4# ankle weight and level 1 tband x 12. Hip abduction with level 1 tband x 12. Ankle PF x 15 with level 1 tband ankle DF x 15 with level 1 tband. Pt left supine in bed with call bell in reach and all needs met.        Therapy Documentation Precautions:  Precautions Precautions: Fall, Sternal Restrictions Weight Bearing Restrictions: No Other Position/Activity Restrictions: sternal precautions  Vital Signs: Therapy Vitals Temp: 98.1 F (36.7 C) Temp Source: Oral Pulse Rate: 66 Resp: 17 BP: 101/60 Patient Position (if appropriate): Lying Oxygen Therapy SpO2: 98 % O2 Device: Room Air Pain: denies    Therapy/Group: Individual Therapy  ALorie Phenix9/04/2021, 4:47 PM

## 2021-08-18 NOTE — Progress Notes (Signed)
PROGRESS NOTE   Subjective/Complaints:  Pt reports L hand pain is "deep" doesn't think that lidocaine topical would be helpful-  Due to stroke.  Mainly painful tingling.  Bowels- need to reduce bowel meds to prn.   ROS:  Pt denies SOB, abd pain, CP, N/V/C/D, and vision changes   Objective:   No results found. No results for input(s): WBC, HGB, HCT, PLT in the last 72 hours.  No results for input(s): NA, K, CL, CO2, GLUCOSE, BUN, CREATININE, CALCIUM in the last 72 hours.   Intake/Output Summary (Last 24 hours) at 08/18/2021 1522 Last data filed at 08/18/2021 0715 Gross per 24 hour  Intake 237 ml  Output --  Net 237 ml        Physical Exam: Vital Signs Blood pressure 101/60, pulse 66, temperature 98.1 F (36.7 C), temperature source Oral, resp. rate 17, height '5\' 7"'$  (1.702 m), weight 85.8 kg, SpO2 98 %.   General: awake, alert, appropriate, laying in side in bed- woke easily; NAD HENT: conjugate gaze; oropharynx moist CV: regular rate; no JVD Pulmonary: CTA B/L; no W/R/R- good air movement GI: soft, NT, ND, (+)BS Psychiatric: appropriate Neurological: Ox3 TTP over L hand- no signs of erythema or atrophy Musculoskeletal:  Ext: trace bilateral lower extremity edema Skin:    Comments: Midline chest incision clean and dry. Mild right thigh bruising Neurological:     Comments: Patient is alert.  No acute distress.  Follows commands and oriented x3.  She recalls her latest inpatient rehab admission. Residual left sided weakness from prior stroke, wrist drop.  Assessment/Plan: 1. Functional deficits which require 3+ hours per day of interdisciplinary therapy in a comprehensive inpatient rehab setting. Physiatrist is providing close team supervision and 24 hour management of active medical problems listed below. Physiatrist and rehab team continue to assess barriers to discharge/monitor patient progress toward  functional and medical goals  Care Tool:  Bathing    Body parts bathed by patient: Right arm, Left arm, Chest, Abdomen, Face, Front perineal area, Buttocks, Right upper leg, Left upper leg, Right lower leg, Left lower leg   Body parts bathed by helper: Right lower leg, Left lower leg     Bathing assist Assist Level: Supervision/Verbal cueing     Upper Body Dressing/Undressing Upper body dressing   What is the patient wearing?: Pull over shirt    Upper body assist Assist Level: Set up assist    Lower Body Dressing/Undressing Lower body dressing      What is the patient wearing?: Pants     Lower body assist Assist for lower body dressing: Supervision/Verbal cueing (bedlevel)     Toileting Toileting    Toileting assist Assist for toileting: Maximal Assistance - Patient 25 - 49%     Transfers Chair/bed transfer  Transfers assist     Chair/bed transfer assist level: Moderate Assistance - Patient 50 - 74%     Locomotion Ambulation   Ambulation assist      Assist level: Moderate Assistance - Patient 50 - 74% Assistive device: Walker-rolling (also L hand splint) Max distance: 60   Walk 10 feet activity   Assist     Assist level: Moderate  Assistance - Patient - 50 - 74% Assistive device: Walker-rolling   Walk 50 feet activity   Assist Walk 50 feet with 2 turns activity did not occur: Safety/medical concerns (fatigue, LE weakness, decreased balance/postural control, deconditioning)  Assist level: Moderate Assistance - Patient - 50 - 74%      Walk 150 feet activity   Assist Walk 150 feet activity did not occur: Safety/medical concerns (fatigue, LE weakness, decreased balance/postural control, deconditioning)         Walk 10 feet on uneven surface  activity   Assist Walk 10 feet on uneven surfaces activity did not occur: Safety/medical concerns (fatigue, LE weakness, decreased balance/postural control, deconditioning)          Wheelchair     Assist Is the patient using a wheelchair?: Yes Type of Wheelchair: Manual Wheelchair activity did not occur: Safety/medical concerns (fatigue, LE weakness, decreased balance/postural control, deconditioning)         Wheelchair 50 feet with 2 turns activity    Assist    Wheelchair 50 feet with 2 turns activity did not occur: Safety/medical concerns (fatigue, LE weakness, decreased balance/postural control, deconditioning)       Wheelchair 150 feet activity     Assist  Wheelchair 150 feet activity did not occur: Safety/medical concerns (fatigue, LE weakness, decreased balance/postural control, deconditioning)       Blood pressure 101/60, pulse 66, temperature 98.1 F (36.7 C), temperature source Oral, resp. rate 17, height '5\' 7"'$  (1.702 m), weight 85.8 kg, SpO2 98 %.  Medical Problem List and Plan: 1.  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  Status post median sternotomy resection of fibroelastoma from aortic valve/CABG x4 08/04/2021.  Sternal precautions             -patient may shower but incision must be covered             -ELOS/Goals: 15 days, d/c 9/13         Continue CIR- PT, OT  2.  Impaired mobility, ambulating 40 feet -DVT/anticoagulation:  Pharmaceutical: Continue Lovenox             -antiplatelet therapy: Aspirin 325 mg daily 3. Post-stroke left hand pain: Add Gabapentin '100mg'$  BID and continue '400mg'$  HS. Robaxin and tramadol as needed. Add B6 '50mg'$  daily to improve sensitivity to pain.   9/5- will increase gabapentin to 200 mg BID and 600 mg QHS- see if that helps hand pain.  4. Depression: Continue Zoloft 100 mg daily, Xanax as needed.              -antipsychotic agents: N/A 5. Neuropsych: This patient is capable of making decisions on her own behalf. 6. Skin/Wound Care: Routine skin checks 7. Fluids/Electrolytes/Nutrition: Routine in and outs with follow-up chemistries 8.ABLA. Hgb reviewed and stable,  monitor weekly.  9.  Diabetes mellitus with peripheral neuropathy.  Hemoglobin A1c 7.8.  DiaBeta 5 mg daily, Glucophage 1000 mg twice daily. Discuss Qutenza 10  Hypertension.  restart norvasc 2.'5mg'$ , continue Lasix 40 mg daily, hydralazine 10 mg nightly, Cozaar 100 mg daily, Lopressor 12.5 mg twice daily.  Monitor with increased mobility 9/5- BP controlled- con't regimen 11.  GERD.  Protonix 12.  Restless leg syndrome.  Requip nightly 13.  Hyperlipidemia.  Lipitor 14.  Obesity.  BMI 30.83.  Dietary follow-up 15.  History of right thalamic infarction 5/22.  Received CIR.  Continue aspirin. 16. Left wrist drop: splint ordered 17. Vitamin D deficiency: start ergocalciferol 50,000U once per week for  7 weeks 18. Suboptimal magnesium: start '250mg'$  magnesium gluconate HS. Continue  magnesium oxide '400mg'$  BID  9/5- likely why having loose stools- not on any bowel meds scheduled. Mg 1.7- con't regimen for now 19. Dry mouth: stop Cymbalta.     LOS: 7 days A FACE TO FACE EVALUATION WAS PERFORMED  Arnice Vanepps 08/18/2021, 3:22 PM

## 2021-08-18 NOTE — Progress Notes (Signed)
Physical Therapy Session Note  Patient Details  Name: [name] MRN: [id number] Date of Birth: February 07, 1956  Today's Date: 08/18/2021 PT Individual Time: 1000-1009 and 1430-1508 PT Individual Time Calculation (min): 9 min and 38 min  Today's Date: 08/18/2021 PT Missed Time: 64 Minutes Missed Time Reason: Patient fatigue  Short Term Goals: Week 1:  PT Short Term Goal 1 (Week 1): pt will perform bed mobility with min A consistantly PT Short Term Goal 2 (Week 1): pt will transfer bed<>chair with LRAD and min A PT Short Term Goal 3 (Week 1): pt will ambulate 45f with LRAD and min A  Skilled Therapeutic Interventions/Progress Updates:   Treatment Session 1 Received pt supine in bed asleep, upon wakening pt upset about being woken up stating "i'm about to punch the next person that walks in my door" but calmed down after speaking with therapist. Pt reported not getting any sleep last night due to constant interruptions and fatigued from taking shower from OT. Therapist encouraged OOB mobility however pt politely refused but stated she will participate this afternoon. Provided pt with fresh water and elevated HOB for optimal positioning to drink due to complaints of "dry mouth". Concluded session with pt supine in bed, needs within reach, and bed alarm on. 51 minutes missed of skilled physical therapy due to fatigue.   Treatment Session 2 Received pt supine in bed, pt agreeable to PT treatment, and denied any pain during session. Session with emphasis on functional mobility/transfers, generalized strengthening, dynamic standing balance/coordination, stair navigation, and improved activity tolerance. Pt transferred semi-reclined<>sitting EOB with HOB elevated and supervision. RN present to administer medication and pt transferred stand<>pivot bed<>WC with RW and min A with hands in lap to maintain sternal precautions. Pt [facility] for time management purposes. Pt  navigated 8 steps with 2 rails and min A alternating ascending and descending with a step to and step through pattern with cues to avoid pushing/pulling on railings and use for balance only. Pt requested to be weighed and transferred WC<>mat stand<>pivot with RW and min A and stepped onto scale with min A -again, cues to avoid pulling up on grab bar; weight 187.7lbs. Pt then requested to return to room to see if her son was there. Pt's son present and pt requested to return to bed to eat and spend time with her son. Stand<>pivot WC<>bed with RW and min A and sit<>semi-reclined with supervision and cues for sternal precautions. Concluded session with pt semi-reclined in bed, needs within reach, and bed alarm on.   Therapy Documentation Precautions:  Precautions Precautions: Fall, Sternal Restrictions Weight Bearing Restrictions: No Other Position/Activity Restrictions: sternal precautions  Therapy/Group: Individual Therapy AAlfonse AlpersPT, DPT   08/18/2021, 7:23 AM

## 2021-08-19 LAB — GLUCOSE, CAPILLARY
Glucose-Capillary: 144 mg/dL — ABNORMAL HIGH (ref 70–99)
Glucose-Capillary: 133 mg/dL — ABNORMAL HIGH (ref 70–99)
Glucose-Capillary: 177 mg/dL — ABNORMAL HIGH (ref 70–99)
Glucose-Capillary: 91 mg/dL (ref 70–99)

## 2021-08-19 MED ORDER — ONDANSETRON HCL 4 MG PO TABS
4.0000 mg | ORAL_TABLET | Freq: Four times a day (QID) | ORAL | Status: DC | PRN
  Administered 2021-08-19 – 2021-09-03 (×3): 4 mg via ORAL
  Filled 2021-08-19 (×4): qty 1

## 2021-08-19 MED ORDER — GABAPENTIN 400 MG PO CAPS
800.0000 mg | ORAL_CAPSULE | Freq: Every day | ORAL | Status: DC
  Administered 2021-08-19 – 2021-08-20 (×2): 800 mg via ORAL
  Filled 2021-08-19 (×2): qty 2
  Filled 2021-08-19: qty 8

## 2021-08-19 NOTE — Patient Care Conference (Signed)
Inpatient RehabilitationTeam Conference and Plan of Care Update Date: 08/19/2021   Time: 13:36 PM    Patient Name: [name]      Medical Record Number: [id number]  Date of Birth: [birth year] Sex: Female         Room/Bed: 5C07C/5C07C-01 Payor Info: Payor: [employer] / Plan: [place] [employer] / Product Type: *No Product type* /    Admit Date/Time:  08/11/2021  3:14 PM  Primary Diagnosis:  [facility] Problems: Principal Problem:   Debility    Expected Discharge Date: Expected Discharge Date: 08/26/21  Team Members Present: Physician leading conference: Dr. Leeroy Cha Social Worker Present: Ovidio Kin, LCSW Nurse Present: Dorien Chihuahua, RN PT Present: [name] Sax, PT OT Present: Simonne Come, OT PPS Coordinator present : Gunnar Fusi, SLP     Current Status/Progress Goal Weekly Team Focus  Bowel/Bladder             Swallow/Nutrition/ Hydration             ADL's   Supervision/setup UB bathing/dressing, CGA for LB dressing and toileting, requires assistance to don socks.  Min A sit > stand from lower surfaces, Supervision ambulatory transfers with RW.  Pt still requires intermittent cues for sternal precautions during functional tasks.  Supervision  ADL retraining, sternal precautions during self-care/functional tasks, transfers, activity tolerance/endurance, d/c planning   Mobility   bed mobility min A, transfers with RW min A, gait 44f with RW min A. slightly impulsive and requires mod cues to maintain sternal precautions and for L attention.  supervision, CGA for steps  functional mobility/transfers, generalized strengthening, dynamic standing balance/coordination, ambulation, stair navigation, adherance to precautions, D/C planning.   Communication             Safety/Cognition/ Behavioral Observations            Pain             Skin               Discharge Planning:  Back to daughters home-between she and her boyfriend can provide care.  May be times she is alone   Team Discussion: Left hand persists and addressed with medication along with insomnia.  Dry mouth relieved with discontinuation of cymbalta.  Patient on target to meet rehab goals: yes, currently min assist for bed mobility and transfers min - CGA. Left inattention and requires cues for adherence to sternal precautions. Needs CGA to EOB and able to ambulate 629 with a walker and min assist that goes to mod assist with fatigue.Patient unable to recognize need for rest breaks. Goals for discharge set for supervision level overall.   *See Care Plan and progress notes for long and short-term goals.   Revisions to Treatment Plan:    Teaching Needs: Medications, safety, transfers, left inattention, etc  Current Barriers to Discharge: Decreased caregiver support, Home enviroment access/layout, and Weight bearing restrictions  Possible Resolutions to Barriers: Family education with daughter and boyfriend     Medical Summary Current Status: left hand pain, overweight (BMI 29.63), dry mouth improved, insomnia, soreness at chest incision  Barriers to Discharge: Medical stability;Wound care  Barriers to Discharge Comments: left hand pain, overweight (BMI 29.63), dry mouth improved, insomnia, soreness at chest incision Possible Resolutions to [employer]Focus: increase gabapentin HS to '800mg'$ , provide dietary counseling, discontinue cymbalta, continue to monitor incision site   Continued Need for Acute Rehabilitation Level of Care: The patient requires daily medical management by a physician with  specialized training in physical medicine and rehabilitation for the following reasons: Direction of a multidisciplinary physical rehabilitation program to maximize functional independence : Yes Medical management of patient stability for increased activity during participation in an intensive rehabilitation regime.: Yes Analysis of laboratory values and/or radiology reports  with any subsequent need for medication adjustment and/or medical intervention. : Yes   I attest that I was present, lead the team conference, and concur with the assessment and plan of the team.   Dorien Chihuahua B 08/19/2021, 3:10 PM

## 2021-08-19 NOTE — Progress Notes (Signed)
Physical Therapy Weekly Progress Note  Patient Details  Name: [name] MRN: [id number] Date of Birth: 08-05-1956  Beginning of progress report period: August 12, 2021 End of progress report period: August 19, 2021  Patient has met 3 of 3 short term goals. Pt demonstrates gradual progress towards long term goals. Pt currently requires CGA/min A for bed mobility, min A for transfers with RW, min/mod A to ambulate 20f with RW, and min A to navigate 8 steps with 2 rails. Pt continues to require maximal cues to adhere to sternal precautions and is limited by occasional pain in L palm, generalized weakness/deconditioning, decreased balance/postural control, and mild L inattention and impulsivity.   Patient continues to demonstrate the following deficits muscle weakness, decreased cardiorespiratoy endurance, abnormal tone, unbalanced muscle activation, and decreased coordination, and decreased standing balance, decreased postural control, hemiplegia, decreased balance strategies, and difficulty maintaining precautions and therefore will continue to benefit from skilled PT intervention to increase functional independence with mobility.  Patient progressing toward long term goals..  Continue plan of care.  PT Short Term Goals Week 1:  PT Short Term Goal 1 (Week 1): pt will perform bed mobility with min A consistantly PT Short Term Goal 1 - Progress (Week 1): Met PT Short Term Goal 2 (Week 1): pt will transfer bed<>chair with LRAD and min A PT Short Term Goal 2 - Progress (Week 1): Met PT Short Term Goal 3 (Week 1): pt will ambulate 248fwith LRAD and min A PT Short Term Goal 3 - Progress (Week 1): Met Week 2:  PT Short Term Goal 1 (Week 2): STG=LTG due to LOS  Skilled Therapeutic Interventions/Progress Updates:  Ambulation/gait training;Discharge planning;Functional mobility training;Psychosocial support;Therapeutic Activities;Balance/vestibular training;Disease  management/prevention;Neuromuscular re-education;Skin care/wound management;Therapeutic Exercise;Wheelchair propulsion/positioning;Cognitive remediation/compensation;DME/adaptive equipment instruction;Pain management;Splinting/orthotics;UE/LE Strength taining/ROM;Community reintegration;Functional electrical stimulation;Patient/family education;Stair training;UE/LE Coordination activities   Therapy Documentation Precautions:  Precautions Precautions: Fall, Sternal Restrictions Weight Bearing Restrictions: No Other Position/Activity Restrictions: sternal precautions  Therapy/Group: Individual Therapy AnAlfonse AlpersT, DPT  08/19/2021, 7:36 AM

## 2021-08-19 NOTE — Progress Notes (Signed)
Occupational Therapy Weekly Progress Note  Patient Details  Name: [name] MRN: [id number] Date of Birth: [birth year]  Beginning of progress report period: August 12, 2021 End of progress report period: August 19, 2021  Today's Date: 08/19/2021 OT Individual Time: 9924-2683 and 1000-1054 OT Individual Time Calculation (min): 48 min and 54 min   Patient has met 3 of 3 short term goals.  Pt is making slow, but steady progress towards goals.  Pt continues to require physical assistance for bed mobility due to sternal precautions, but then is able to stand from various height surfaces with Min A - CGA.  Pt is completing ambulatory transfers with RW with close supervision.  Pt is able to verbalize understanding of sternal precautions, however continues to require intermittent cues to adhere to during functional tasks.  Pt continues to c/o pain in L hand, therapist applied kinesiotape and noted decrease in edema in digits.    Patient continues to demonstrate the following deficits: muscle weakness, decreased cardiorespiratoy endurance, and decreased standing balance, decreased balance strategies, and difficulty maintaining precautions and therefore will continue to benefit from skilled OT intervention to enhance overall performance with BADL and Reduce care partner burden.  Patient progressing toward long term goals..  Continue plan of care.  OT Short Term Goals Week 1:  OT Short Term Goal 1 (Week 1): Pt will complete sit > stand with CGA while maintaining sternal precautions OT Short Term Goal 1 - Progress (Week 1): Met OT Short Term Goal 2 (Week 1): Pt will complete LB dressing with min assist while adhering ot sternal precautions OT Short Term Goal 2 - Progress (Week 1): Met OT Short Term Goal 3 (Week 1): Pt will complete toileting (clothing management and hygiene) with CGA while adhering to sternal precautions OT Short Term Goal 3 - Progress (Week 1): Met Week 2:  OT Short Term Goal 1  (Week 2): STG = LTGs due to remaining LOS  Skilled Therapeutic Interventions/Progress Updates:    1)  Treatment session with focus on self-care retraining and functional mobility.  Pt received supine in bed agreeable to therapy session.  Pt expressing fatigue but desire to shower this morning.  Pt completed bed mobility with use of hospital bed features and min assist due to sternal precautions.  Pt completed sit > stand from EOB CGA and ambulated to shower with close supervision with RW. Pt completed toileting with CGA and ambulated to shower with RW and min assist when stepping over threshold.  Pt completed bathing with intermittent supervision.  Pt returned to EOB to complete dressing. Pt able to don pants and shoes with CGA when standing to pull pants over hips.  Pt returned to supine stating "I just don't feel good today" and "I don't think it's going to be a good day".  Pt remained semi-reclined in bed with all needs in reach.  2) Treatment session with focus on functional mobility, functional use of LUE, and activity tolerance.  Pt received supine in bed asleep but easily aroused.  Pt reports still not feeling well, but agreeable to working on her L hand in the room. Therapist encouraged pt to utilize theraputty in LUE to focus on mobility and decreased edema.  Pt rolled out putty with L hand and engaged in pinch and squeeze.  Therapist educated on purpose of use of putty.  Pt reports need to toilet.  Pt completed bed mobility with min assist to come to sitting EOB.  Pt ambulated to toilet with  RW with supervision.  Pt began dry heaving and spitting up yellow phlegm.  RN notified.  Pt returned to bed CGA with RW and returned to semi-reclined.  RN provided nausea medicine and ginger ale.  Pt agreeable to continue working on LUE after a few minute rest.  Therapeutic use of self and discussion of goals and progress towards goals during rest.  Therapist directed pt in picking up checker pieces and stacking.   Pt dropping 40% of pieces and unable to stack due to LUE weakness s/p CVA.  Pt returned to semi-reclined and left with all needs in reach.  Pt reports still feeling nauseous and tired.  Therapy Documentation Precautions:  Precautions Precautions: Fall, Sternal Restrictions Weight Bearing Restrictions: No Other Position/Activity Restrictions: sternal precautions Vital Signs: Therapy Vitals Temp: 98.3 F (36.8 C) Pulse Rate: 65 Resp: 17 BP: 114/68 Patient Position (if appropriate): Lying Oxygen Therapy SpO2: 96 % Pain:  Pt c/o "soreness" along incision line.  MD notified.   Therapy/Group: Individual Therapy  Simonne Come 08/19/2021, 8:10 AM

## 2021-08-19 NOTE — Progress Notes (Signed)
PROGRESS NOTE   Subjective/Complaints: C/o chest soreness, left hand pain- latter is still her most severe pain Dry mouth improved today Still sleeping poorly, agreeable to increasing Gabapentin to '600mg'$  HS   ROS:  Pt denies SOB, abd pain, CP, N/V/C/D, and vision changes, + left hand pain   Objective:   No results found. No results for input(s): WBC, HGB, HCT, PLT in the last 72 hours.  No results for input(s): NA, K, CL, CO2, GLUCOSE, BUN, CREATININE, CALCIUM in the last 72 hours.  No intake or output data in the 24 hours ending 08/19/21 1202       Physical Exam: Vital Signs Blood pressure 114/68, pulse 65, temperature 98.3 F (36.8 C), resp. rate 17, height '5\' 7"'$  (1.702 m), weight 85.8 kg, SpO2 96 %. Gen: no distress, normal appearing HEENT: oral mucosa pink and moist, NCAT Cardio: Reg rate Chest: normal effort, normal rate of breathing Abd: soft, non-distended Ext: no edema Psych: pleasant, normal affect Skin: intact Neuro: TTP over L hand- no signs of erythema or atrophy Musculoskeletal:  Ext: trace bilateral lower extremity edema Skin:    Comments: Midline chest incision clean and dry. Mild right thigh bruising Neurological:     Comments: Patient is alert.  No acute distress.  Follows commands and oriented x3.  She recalls her latest inpatient rehab admission. Residual left sided weakness from prior stroke, wrist drop.  Assessment/Plan: 1. Functional deficits which require 3+ hours per day of interdisciplinary therapy in a comprehensive inpatient rehab setting. Physiatrist is providing close team supervision and 24 hour management of active medical problems listed below. Physiatrist and rehab team continue to assess barriers to discharge/monitor patient progress toward functional and medical goals  Care Tool:  Bathing    Body parts bathed by patient: Right arm, Left arm, Chest, Abdomen, Face, Front  perineal area, Buttocks, Right upper leg, Left upper leg, Right lower leg, Left lower leg   Body parts bathed by helper: Right lower leg, Left lower leg     Bathing assist Assist Level: Supervision/Verbal cueing     Upper Body Dressing/Undressing Upper body dressing   What is the patient wearing?: Pull over shirt    Upper body assist Assist Level: Set up assist    Lower Body Dressing/Undressing Lower body dressing      What is the patient wearing?: Pants     Lower body assist Assist for lower body dressing: Set up assist     Toileting Toileting    Toileting assist Assist for toileting: Contact Guard/Touching assist     Transfers Chair/bed transfer  Transfers assist     Chair/bed transfer assist level: Minimal Assistance - Patient > 75%     Locomotion Ambulation   Ambulation assist      Assist level: Moderate Assistance - Patient 50 - 74% Assistive device: Walker-rolling (also L hand splint) Max distance: 60   Walk 10 feet activity   Assist     Assist level: Moderate Assistance - Patient - 50 - 74% Assistive device: Walker-rolling   Walk 50 feet activity   Assist Walk 50 feet with 2 turns activity did not occur: Safety/medical concerns (fatigue, LE weakness, decreased  balance/postural control, deconditioning)  Assist level: Moderate Assistance - Patient - 50 - 74%      Walk 150 feet activity   Assist Walk 150 feet activity did not occur: Safety/medical concerns (fatigue, LE weakness, decreased balance/postural control, deconditioning)         Walk 10 feet on uneven surface  activity   Assist Walk 10 feet on uneven surfaces activity did not occur: Safety/medical concerns (fatigue, LE weakness, decreased balance/postural control, deconditioning)         Wheelchair     Assist Is the patient using a wheelchair?: Yes Type of Wheelchair: Manual Wheelchair activity did not occur: Safety/medical concerns (fatigue, LE weakness,  decreased balance/postural control, deconditioning)         Wheelchair 50 feet with 2 turns activity    Assist    Wheelchair 50 feet with 2 turns activity did not occur: Safety/medical concerns (fatigue, LE weakness, decreased balance/postural control, deconditioning)       Wheelchair 150 feet activity     Assist  Wheelchair 150 feet activity did not occur: Safety/medical concerns (fatigue, LE weakness, decreased balance/postural control, deconditioning)       Blood pressure 114/68, pulse 65, temperature 98.3 F (36.8 C), resp. rate 17, height '5\' 7"'$  (1.702 m), weight 85.8 kg, SpO2 96 %.  Medical Problem List and Plan: 1.  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  Status post median sternotomy resection of fibroelastoma from aortic valve/CABG x4 08/04/2021.  Sternal precautions             -patient may shower but incision must be covered             -ELOS/Goals: 15 days, d/c 9/13         Continue CIR- PT, OT  2.  Impaired mobility, ambulating 40 feet -DVT/anticoagulation:  Pharmaceutical: Continue Lovenox             -antiplatelet therapy: Aspirin 325 mg daily 3. Post-stroke left hand pain: Add Gabapentin '100mg'$  BID and continue '400mg'$  HS. Robaxin and tramadol as needed. Add B6 '50mg'$  daily to improve sensitivity to pain. Increase gabapentin HS to '800mg'$  4. Depression: Continue Zoloft 100 mg daily, Xanax as needed.              -antipsychotic agents: N/A 5. Neuropsych: This patient is capable of making decisions on her own behalf. 6. Chest incision: Healing well, continue Routine skin checks 7. Fluids/Electrolytes/Nutrition: Routine in and outs with follow-up chemistries 8.ABLA. Hgb reviewed and stable, monitor weekly.  9.  Diabetes mellitus with peripheral neuropathy.  Hemoglobin A1c 7.8.  DiaBeta 5 mg daily, Glucophage 1000 mg twice daily. Discuss Qutenza 10  Hypertension.  restart norvasc 2.'5mg'$ , continue Lasix 40 mg daily, hydralazine 10 mg  nightly, Cozaar 100 mg daily, Lopressor 12.5 mg twice daily.  Monitor with increased mobility 9/6- BP controlled- continue regimen 11.  GERD.  Protonix 12.  Restless leg syndrome.  Requip nightly 13.  Hyperlipidemia.  Lipitor 14.  Obesity.  BMI 30.83.  Dietary follow-up 15.  History of right thalamic infarction 5/22.  Received CIR.  Continue aspirin. 16. Left wrist drop: splint ordered 17. Vitamin D deficiency: start ergocalciferol 50,000U once per week for 7 weeks 18. Suboptimal magnesium: start '250mg'$  magnesium gluconate HS. Continue  magnesium oxide '400mg'$  BID  9/5- likely why having loose stools- not on any bowel meds scheduled. Mg 1.7- con't regimen for now 19. Dry mouth: stop Cymbalta. Improved    LOS: 8 days A FACE TO FACE  EVALUATION WAS PERFORMED  Clide Deutscher [place] 08/19/2021, 12:02 PM

## 2021-08-19 NOTE — Progress Notes (Signed)
Physical Therapy Session Note  Patient Details  Name: [name] MRN: [id number] Date of Birth: 11/02/1956  Today's Date: 08/19/2021 PT Individual Time: 9407-6808 and 1432-1510  PT Individual Time Calculation (min): 25 min and 38 min PT Missed Time: 22 minutes  PT Missed Time Reason: Fatigue   Short Term Goals: Week 1:  PT Short Term Goal 1 (Week 1): pt will perform bed mobility with min A consistantly PT Short Term Goal 1 - Progress (Week 1): Met PT Short Term Goal 2 (Week 1): pt will transfer bed<>chair with LRAD and min A PT Short Term Goal 2 - Progress (Week 1): Met PT Short Term Goal 3 (Week 1): pt will ambulate 55f with LRAD and min A PT Short Term Goal 3 - Progress (Week 1): Met Week 2:  PT Short Term Goal 1 (Week 2): STG=LTG due to LOS  Skilled Therapeutic Interventions/Progress Updates:  Treatment Session 1 Received pt supine in bed asleep, upon wakening pt reported "getting sick" this morning from taking medications on an empty stomach and hurting/feeling sore along sternal incision site but declined any pain medication and reported already taking anti-nausea medication. Checked incision site and no redness/irritation noted. Encouraged getting OOB and going on a short walk but pt declined stating "I still don't feel good". Encouraged pt to try to eat as pt reported not eating all day and positioned bed to eat however pt only took one bite of chicken and a few bites of rice and broccoli. Educated pt on importance of proper nutritional intake and finally agreed to try eating peaches - ate entire cup. Encouraged functional use of LUE but pt ignored recommendation ultimately using RUE to eat. Worked on LUE grip strength with putty for 1 minute intervals targeting wrist flexion as well as individual digit finger flexion x10 reps -increased weakness at 4th digit. Concluded session with pt semi-reclined in bed, needs within reach, and bed alarm on.   Treatment Session 2 Received pt  supine in bed asleep, pt easily aroused and denied any pain during session and reported feeling better this afternoon but just tired. Session with emphasis on functional mobility/transfers, generalized strengthening, dynamic standing balance/coordination, community navigation, and improved activity tolerance. Pt declined going to therapy gym despite encouragement but requested to go to gift shop. Pt transferred supine<>sitting EOB from flat bed with min A and cues for logroll technique to maintain sternal precautions. Donned crocs sitting EOB with setup assist and transferred bed<>WC stand<>pivot with RW and CGA but min A to stand from lower bed with arms in lap. [place] but politely refused to ambulate, [facility]. Worked on dynamic sitting balance, reaching outside BOS, and functional use of LUE selecting items from various height shelves to purchase. Also discussed nutrition and encouraged foods high in protein and less in fats, sugars, and salt. Pt able to manage money and [facility] then requested to return to room. Stand<>pivot WC<>bed with RW and min A and sit<>supine with supervision. Concluded session with pt supine in bed, needs within reach, and bed alarm on. 20 minutes missed of skilled physical therapy due to fatigue.   Therapy Documentation Precautions:  Precautions Precautions: Fall, Sternal Restrictions Weight Bearing Restrictions: No Other Position/Activity Restrictions: sternal precautions   Therapy/Group: Individual Therapy AAlfonse AlpersPT, DPT   08/19/2021, 7:38 AM

## 2021-08-20 ENCOUNTER — Inpatient Hospital Stay: Payer: 59 | Admitting: Adult Health

## 2021-08-20 LAB — GLUCOSE, CAPILLARY
Glucose-Capillary: 149 mg/dL — ABNORMAL HIGH (ref 70–99)
Glucose-Capillary: 145 mg/dL — ABNORMAL HIGH (ref 70–99)
Glucose-Capillary: 127 mg/dL — ABNORMAL HIGH (ref 70–99)
Glucose-Capillary: 129 mg/dL — ABNORMAL HIGH (ref 70–99)

## 2021-08-20 NOTE — Progress Notes (Signed)
Patient ID: [name], female   DOB: 08-01-1956, 65 y.o.   MRN: [id number]  met with pt and her brother was present she gave worker permission to speak in front of him. Discussed team conference and progress toward her goals and discharge still 9/13. She reports she is tired and can not eat this food. Suggested she get sandwiches and salads or have family bring food in from home. She knows this. Discussed OP recommendation and she wants to think about whether OP versus HH would be better for her. Will get back with this worker. Continue to work toward discharge.

## 2021-08-20 NOTE — Progress Notes (Signed)
Physical Therapy Session Note  Patient Details  Name: [name] MRN: [id number] Date of Birth: 1956-11-17  Today's Date: 08/20/2021 PT Individual Time: 3646-8032 PT Individual Time Calculation (min): 39 min   Today's Date: 08/20/2021 PT Missed Time: 21 Minutes Missed Time Reason: Patient fatigue  Short Term Goals: Week 1:  PT Short Term Goal 1 (Week 1): pt will perform bed mobility with min A consistantly PT Short Term Goal 1 - Progress (Week 1): Met PT Short Term Goal 2 (Week 1): pt will transfer bed<>chair with LRAD and min A PT Short Term Goal 2 - Progress (Week 1): Met PT Short Term Goal 3 (Week 1): pt will ambulate 40f with LRAD and min A PT Short Term Goal 3 - Progress (Week 1): Met Week 2:  PT Short Term Goal 1 (Week 2): STG=LTG due to LOS  Skilled Therapeutic Interventions/Progress Updates:   Received pt supine in bed asleep, upon wakening pt extremely lethargic and falling in/out of sleep while speaking with therapist. Pt ultimately agreeable to PT treatment and denied any pain during session. Session with emphasis on functional mobility/transfers and improved activity tolerance. Pt transferred supine<>sitting EOB from flat bed with supervision and use of bedrails with cues for logroll technique to maintain sternal precautions. Pt requested therapist brush hair and donned crocs with min A. Stand<>pivot bed<>WC with RW and min A and pt reported feeling weak and lightheaded. BP sitting in WC with ted hose on: 81/55 on RUE - retook and BP 65/50 - RN notified. Transferred back to bed with min A but increased unsteadiness when turning to sit with LUE slipping off handgrip and increased weakness in LLE. Sit<>supine with supervision and positioned HOB in Trendelenburg position. BP in supine: 112/55 on LUE and 88/59 on RUE. Noted BP much lower when taken on RUE compared to LUE - RN aware. Encouraged pt to eat as she reported only having a few bites of salad and spaghetti due to "not liking  the food"; pt agreed to eat fruit up. Positioned HOB at 35 degrees and pt able to eat entire cup. Educated pt on need for proper nutritional intake to maintain strength and energy. Concluded session with pt supine in bed, needs within reach, and bed alarm on. 21 minutes missed of skilled physical therapy due to fatigue.   Therapy Documentation Precautions:  Precautions Precautions: Fall, Sternal Restrictions Weight Bearing Restrictions: No Other Position/Activity Restrictions: sternal precautions  Therapy/Group: Individual Therapy AAlfonse AlpersPT, DPT   08/20/2021, 7:49 AM

## 2021-08-20 NOTE — Progress Notes (Signed)
Physical Therapy Session Note  Patient Details  Name: [name] MRN: [id number] Date of Birth: 05/10/1956  Today's Date: 08/20/2021 PT Individual Time: 0845-0955 PT Individual Time Calculation (min): 70 min   Short Term Goals: Week 2:  PT Short Term Goal 1 (Week 2): STG=LTG due to LOS  Skilled Therapeutic Interventions/Progress Updates: Pt presented in bed sleeping but easily aroused and agreeable to therapy. Pt denies pain at rest but c/o sternal pain with movement. PTA donned TED hose total A. Performed supine to sit with CGA and increased time with verbal cues to maintain sternal precautions. In sitting pt donned shoes with set up and performed STS with minA and bed elevated. PTA provided continued education on changes in mobility due to CABG as pt states "I can do this at home with no problem". Explained that prior to this admission did not have sternal precautions. Pt then ambulated to bathroom with with RW and CGA, performed LB clothing management with CGA (+urinary void). Pt was able to stand from elevated toilet with CGA. Pt then ambulated to w/c and performed oral hygiene at w/c level with set up. Pt transported to rehab gym and attempted to participated in standing activity incorporating L hand per pt request. Pt performed STS from w/c with minA with pt maintaining standing < 41mn then impulsively returning to sitting. Pt required max multimodal cues during that time to come to full erect posture and use L hand. On second bout pt was able to tolerate standing for longer period and incorporated using L hand to move x 5 checker pieces on magnetic board. Pt required minA to release pieces consistently. During seated rest pt also participated in shoulder shrugs, backwards/forward rolls, scapular squeezes and dorsal extension x 15 ea. Educated pt on how she's able to do these on her own as well to which pt seemed appreciative. Pt transported back to room and performed ambulatory transfer to  bed. Pt required minA sit to supine for LLE management and was able to reposition via segmental bridging. Pt left in bed at end of session with bed alarm on, call bell within reach and needs met.      Therapy Documentation Precautions:  Precautions Precautions: Fall, Sternal Restrictions Weight Bearing Restrictions: No Other Position/Activity Restrictions: sternal precautions General:   Vital Signs:   Pain: Pain Assessment Pain Score: 5  Mobility:   Locomotion :    Trunk/Postural Assessment :    Balance:   Exercises:   Other Treatments:      Therapy/Group: Individual Therapy  Makita Blow 08/20/2021, 12:35 PM

## 2021-08-20 NOTE — Progress Notes (Signed)
Occupational Therapy Session Note  Patient Details  Name: [name] MRN: [id number] Date of Birth: 08-25-1956  Today's Date: 08/20/2021 OT Individual Time: 1036-1130 OT Individual Time Calculation (min): 54 min    Short Term Goals: Week 2:  OT Short Term Goal 1 (Week 2): STG = LTGs due to remaining LOS  Skilled Therapeutic Interventions/Progress Updates:    Treatment session with focus on functional mobility, dynamic standing balance, functional use of LUE, and safety with mobility.  Pt received supine in bed asleep, requiring increased time to fully arouse.  Pt remained lethargic throughout session, but improved when sitting EOB.  Pt completed bed mobility with supervision/cues to adhere to sternal precautions during mobility.  Engaged in [place] reaching and grasping activity with foam blocks to address functional reach and grasp as needed for self-care tasks.  Pt dropping items ~30% of time.  Pt reports need to toilet.  Pt stood with RW with Min assist and then began ambulating to toilet.  Pt demonstrating decreased ability to maintain grasp on RW with L hand and decreased gait, pt reports feeling extremely fatigued. Pt required assistance from therapist with clothing management due to decreased standing balance.  Therapist encouraged pt to complete stand pivot transfer toilet > w/c > bed for increased safety. Pt returned to semi-reclined and left with all needs in reach.    Therapy Documentation Precautions:  Precautions Precautions: Fall, Sternal Restrictions Weight Bearing Restrictions: No Other Position/Activity Restrictions: sternal precautions  Pain: Pain Assessment Pain Scale: 0-10 Pain Score: 5  Pain Type: Acute pain Pain Location: Rib cage Pain Descriptors / Indicators: Sore Pain Frequency: Intermittent Pain Onset: Gradual Pain Intervention(s): Medication (See eMAR)    Therapy/Group: Individual Therapy  Simonne Come 08/20/2021, 12:20 PM

## 2021-08-21 LAB — URINALYSIS, ROUTINE W REFLEX MICROSCOPIC
Bilirubin Urine: NEGATIVE
Glucose, UA: NEGATIVE mg/dL
Hgb urine dipstick: NEGATIVE
Ketones, ur: NEGATIVE mg/dL
Leukocytes,Ua: NEGATIVE
Nitrite: NEGATIVE
Protein, ur: NEGATIVE mg/dL
Specific Gravity, Urine: 1.01 (ref 1.005–1.030)
pH: 5.5 (ref 5.0–8.0)

## 2021-08-21 LAB — GLUCOSE, CAPILLARY
Glucose-Capillary: 144 mg/dL — ABNORMAL HIGH (ref 70–99)
Glucose-Capillary: 182 mg/dL — ABNORMAL HIGH (ref 70–99)
Glucose-Capillary: 150 mg/dL — ABNORMAL HIGH (ref 70–99)
Glucose-Capillary: 166 mg/dL — ABNORMAL HIGH (ref 70–99)
Glucose-Capillary: 64 mg/dL — ABNORMAL LOW (ref 70–99)
Glucose-Capillary: 93 mg/dL (ref 70–99)

## 2021-08-21 MED ORDER — GABAPENTIN 300 MG PO CAPS
600.0000 mg | ORAL_CAPSULE | Freq: Every day | ORAL | Status: DC
  Administered 2021-08-21 – 2021-08-22 (×2): 600 mg via ORAL
  Filled 2021-08-21 (×2): qty 2

## 2021-08-21 MED ORDER — GABAPENTIN 100 MG PO CAPS
100.0000 mg | ORAL_CAPSULE | Freq: Two times a day (BID) | ORAL | Status: DC
  Administered 2021-08-21 – 2021-08-22 (×3): 100 mg via ORAL
  Filled 2021-08-21 (×3): qty 1

## 2021-08-21 MED ORDER — ALPRAZOLAM 0.5 MG PO TABS
0.5000 mg | ORAL_TABLET | Freq: Every day | ORAL | Status: DC | PRN
  Administered 2021-08-22 – 2021-08-26 (×2): 0.5 mg via ORAL
  Filled 2021-08-21 (×3): qty 1

## 2021-08-21 NOTE — Progress Notes (Signed)
Therapist Vicente Males reports patient is very weak and presented signs and symptoms of orthostatic hypotension when repositioned while in physical therapy.  Current blood sugar 182 Blood pressure 109/52.  Patient alert and oriented x4 and able to follow commands. Compliant with medication administration. Ate 25% of breakfast.   Patient tends to lay flat while in bed; she finds that is more comfortable.  Encouraged fluids and educated on the importance of keeping HOB elevated at least 30 degrees. Notified Dr. Ranell Patrick of findings. New orders received.

## 2021-08-21 NOTE — Progress Notes (Signed)
Physical Therapy Session Note  Patient Details  Name: [name] MRN: [id number] Date of Birth: 1956-05-16   Today's Date: 08/21/2021 PT Individual Time: 1000-1056 and 1301-1358 PT Individual Time Calculation (min): 56 min and 57 min PT Missed Time: 18 minutes PT Missed Time Reason: Fatigue  Short Term Goals: Week 1:  PT Short Term Goal 1 (Week 1): pt will perform bed mobility with min A consistantly PT Short Term Goal 1 - Progress (Week 1): Met PT Short Term Goal 2 (Week 1): pt will transfer bed<>chair with LRAD and min A PT Short Term Goal 2 - Progress (Week 1): Met PT Short Term Goal 3 (Week 1): pt will ambulate 45f with LRAD and min A PT Short Term Goal 3 - Progress (Week 1): Met Week 2:  PT Short Term Goal 1 (Week 2): STG=LTG due to LOS  Skilled Therapeutic Interventions/Progress Updates:   Treatment Session 1 Received pt supine in bed asleep, pt agreeable to PT treatment, and reported pain 5/10 along sternal incision from dropping her phone on it last night (premedicated). Encouraged pt to sit bed upright and hold phone to her ear in the future. Session with emphasis on functional mobility/transfers, generalized strengthening, dynamic standing balance/coordination, gait training, and improved activity tolerance. Donned ted hose with total A and transferred supine<>sitting EOB from flat bed with mod A and use of bedrails with max cues to adhere to sternal precautions. Upon sitting EOB, pt unsteady requiring use of bedrails to maintain balance. Pt reported feeling "woozy" and with difficulty sustaining arousal. BP: 94/70 - RN notified. Discussed importance of remaining upright and OOB throughout the day to maintain arousal, regulate BP, and to sleep batter at night. Pt required 2 attempts and mod A to stand from elevated bed due to L lateral/posterior LOB. Stand<>pivot bed<>WC with RW and min A with cues to remain balance prior to moving and for eccentric descent when sitting. [facility] and energy [facility] purposes. Sit<>stand with RW and mod A with therapist stabilizing LUE. Pt ambulated 272fwith RW and heavy mod A with total A to control RW and pt pushing it too far forward. Pt had difficulty advancing and controlling LLE with bilateral knees flexed in stance and extremely flexed trunk; pt unable to follow cues to correct. Upon turning to sit, pt pushing RW too far forward and attempting to sit mid-air requiring assist from therapist to pivot hips around for safe landing. Stand<>pivot WC<>bed with RW and mod A and sit<>supine with supervision. BP: 109/59. Concluded session with pt semi-reclined in bed, needs within reach, and bed alarm on. MD notified of pt's clinical presentation today.   Treatment Session 2 Received pt supine in bed asleep, pt agreeable to PT treatment, and denied any pain during session but continues to be lethargic. Session with emphasis on functional mobility/transfers, generalized strengthening, dynamic standing balance/coordination, and improved activity tolerance. Supine<>sitting EOB with HOB slightly elevated and min A with cues for technique to maintain sternal precautions. Pt with posterior lean upon sitting up but able to self-correct with cues. Donned crocs with min A and reported urge to void. For safety reasons due to increased weakness, transferred stand<>pivot to/from bedside commode with RW and mod A with total A for clothing management. Pt able to void and perform peri-care with supervision. Pt then transferred into WC in same manner. [facility] and energy  conservation purposes. Transferred on/off Nustep with RW and min/mod A with cues to get LLE underneath her prior to standing and for hand placement on lap/on RW to maintain sternal precautions. Required assist to get LLE onto footplate due to fatigue and worked on BLE  strengthening for 2 minute intervals for a total of 6 minutes with emphasis on cardiovascular endurance for a total of 109 steps. Stand<>pivot WC<>bed with RW and min A and sit<>supine with supervision. Concluded session with pt semi-reclined in bed, needs within reach, and bed alarm on. 18 minutes missed of skilled physical therapy due to fatigue.   Of note, pt continues to be lethargic and shows decreased motivation to participate during therapy sessions. Pt also requires maximal cues for adherence to sternal precautions with very poor carry over.  Therapy Documentation Precautions:  Precautions Precautions: Fall, Sternal Restrictions Weight Bearing Restrictions: No Other Position/Activity Restrictions: sternal precautions  Therapy/Group: Individual Therapy Alfonse Alpers PT, DPT   08/21/2021, 7:21 AM

## 2021-08-21 NOTE — Progress Notes (Signed)
PROGRESS NOTE   Subjective/Complaints: Sleepy Weak during therapy today- discussed with therapy and nursing may be due to nerve pain medication increases- will decrease gabapentin and benzodiazepine, obtain UA/UC   ROS:  Pt denies SOB, abd pain, CP, N/V/C/D, and vision changes, + left hand pain   Objective:   No results found. No results for input(s): WBC, HGB, HCT, PLT in the last 72 hours.  No results for input(s): NA, K, CL, CO2, GLUCOSE, BUN, CREATININE, CALCIUM in the last 72 hours.   Intake/Output Summary (Last 24 hours) at 08/21/2021 1305 Last data filed at 08/21/2021 1100 Gross per 24 hour  Intake 667 ml  Output --  Net 667 ml         Physical Exam: Vital Signs Blood pressure 105/60, pulse 82, temperature 99.5 F (37.5 C), temperature source Oral, resp. rate 18, height '5\' 7"'$  (1.702 m), weight 88.7 kg, SpO2 92 %. Gen: no distress, normal appearing HEENT: oral mucosa pink and moist, NCAT Cardio: Reg rate Chest: normal effort, normal rate of breathing Abd: soft, non-distended Ext: no edema Psych: pleasant, normal affect Skin: intact Neuro: TTP over L hand- no signs of erythema or atrophy Musculoskeletal:  Ext: trace bilateral lower extremity edema Skin:    Comments: Midline chest incision clean and dry. Mild right thigh bruising Neurological:     Comments: Patient is alert.  No acute distress.  Follows commands and oriented x3.  She recalls her latest inpatient rehab admission. Residual left sided weakness from prior stroke, wrist drop.  Assessment/Plan: 1. Functional deficits which require 3+ hours per day of interdisciplinary therapy in a comprehensive inpatient rehab setting. Physiatrist is providing close team supervision and 24 hour management of active medical problems listed below. Physiatrist and rehab team continue to assess barriers to discharge/monitor patient progress toward functional and  medical goals  Care Tool:  Bathing    Body parts bathed by patient: Right arm, Left arm, Chest, Abdomen, Face, Front perineal area, Buttocks, Right upper leg, Left upper leg, Right lower leg, Left lower leg   Body parts bathed by helper: Right lower leg, Left lower leg     Bathing assist Assist Level: Supervision/Verbal cueing     Upper Body Dressing/Undressing Upper body dressing   What is the patient wearing?: Pull over shirt    Upper body assist Assist Level: Set up assist    Lower Body Dressing/Undressing Lower body dressing      What is the patient wearing?: Pants     Lower body assist Assist for lower body dressing: Set up assist     Toileting Toileting    Toileting assist Assist for toileting: Maximal Assistance - Patient 25 - 49%     Transfers Chair/bed transfer  Transfers assist     Chair/bed transfer assist level: Minimal Assistance - Patient > 75%     Locomotion Ambulation   Ambulation assist      Assist level: Moderate Assistance - Patient 50 - 74% Assistive device: Walker-rolling Max distance: 57f   Walk 10 feet activity   Assist     Assist level: Moderate Assistance - Patient - 50 - 74% Assistive device: Walker-rolling   Walk 50  feet activity   Assist Walk 50 feet with 2 turns activity did not occur: Safety/medical concerns (fatigue, LE weakness, decreased balance/postural control, deconditioning)  Assist level: Moderate Assistance - Patient - 50 - 74%      Walk 150 feet activity   Assist Walk 150 feet activity did not occur: Safety/medical concerns (fatigue, LE weakness, decreased balance/postural control, deconditioning)         Walk 10 feet on uneven surface  activity   Assist Walk 10 feet on uneven surfaces activity did not occur: Safety/medical concerns (fatigue, LE weakness, decreased balance/postural control, deconditioning)         Wheelchair     Assist Is the patient using a wheelchair?:  Yes Type of Wheelchair: Manual Wheelchair activity did not occur: Safety/medical concerns (fatigue, LE weakness, decreased balance/postural control, deconditioning)         Wheelchair 50 feet with 2 turns activity    Assist    Wheelchair 50 feet with 2 turns activity did not occur: Safety/medical concerns (fatigue, LE weakness, decreased balance/postural control, deconditioning)       Wheelchair 150 feet activity     Assist  Wheelchair 150 feet activity did not occur: Safety/medical concerns (fatigue, LE weakness, decreased balance/postural control, deconditioning)       Blood pressure 105/60, pulse 82, temperature 99.5 F (37.5 C), temperature source Oral, resp. rate 18, height '5\' 7"'$  (1.702 m), weight 88.7 kg, SpO2 92 %.  Medical Problem List and Plan: 1.  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  Status post median sternotomy resection of fibroelastoma from aortic valve/CABG x4 08/04/2021.  Sternal precautions             -patient may shower but incision must be covered             -ELOS/Goals: 15 days, d/c 9/13         Continue CIR- PT, OT  2.  Impaired mobility, ambulating 40 feet -DVT/anticoagulation:  Pharmaceutical: Continue Lovenox             -antiplatelet therapy: Aspirin 325 mg daily 3. Post-stroke left hand pain: Robaxin and tramadol as needed. Add B6 '50mg'$  daily to improve sensitivity to pain. Decrease Gabapentin to '100mg'$  BID and '600mg'$  HS.  4. Depression/Anxiety Continue Zoloft 100 mg daily, decrease xanax to daily PRN             -antipsychotic agents: N/A 5. Neuropsych: This patient is capable of making decisions on her own behalf. 6. Chest incision: Healing well, continue Routine skin checks 7. Fluids/Electrolytes/Nutrition: Routine in and outs with follow-up chemistries 8.ABLA. Hgb reviewed and stable, monitor weekly.  9.  Diabetes mellitus with peripheral neuropathy.  Hemoglobin A1c 7.8.  DiaBeta 5 mg daily, Glucophage  1000 mg twice daily. Discuss Qutenza 10  Hypertension.  restart norvasc 2.'5mg'$ , continue Lasix 40 mg daily, hydralazine 10 mg nightly, Cozaar 100 mg daily, Lopressor 12.5 mg twice daily.  Monitor with increased mobility 9/6- BP controlled- continue regimen 11.  GERD.  Protonix 12.  Restless leg syndrome.  Requip nightly 13.  Hyperlipidemia.  Lipitor 14.  Obesity.  BMI 30.83.  Dietary follow-up 15.  History of right thalamic infarction 5/22.  Received CIR.  Continue aspirin. 16. Left wrist drop: splint ordered 17. Vitamin D deficiency: start ergocalciferol 50,000U once per week for 7 weeks 18. Suboptimal magnesium: start '250mg'$  magnesium gluconate HS. Continue  magnesium oxide '400mg'$  BID  9/5- likely why having loose stools- not on any bowel meds scheduled. Mg  1.7- con't regimen for now 19. Dry mouth: stop Cymbalta. Improved 20. Dizziness/weakness: check UA/UC    LOS: 10 days A FACE TO FACE EVALUATION WAS PERFORMED  [name] [name] 08/21/2021, 1:05 PM

## 2021-08-21 NOTE — Progress Notes (Signed)
Occupational Therapy Session Note  Patient Details  Name: [name] MRN: [id number] Date of Birth: 03/28/1956  Today's Date: 08/21/2021 OT Individual Time: 1432-1530 OT Individual Time Calculation (min): 58 min    Short Term Goals: Week 1:  OT Short Term Goal 1 (Week 1): Pt will complete sit > stand with CGA while maintaining sternal precautions OT Short Term Goal 1 - Progress (Week 1): Met OT Short Term Goal 2 (Week 1): Pt will complete LB dressing with min assist while adhering ot sternal precautions OT Short Term Goal 2 - Progress (Week 1): Met OT Short Term Goal 3 (Week 1): Pt will complete toileting (clothing management and hygiene) with CGA while adhering to sternal precautions OT Short Term Goal 3 - Progress (Week 1): Met  Skilled Therapeutic Interventions/Progress Updates:  Pt asleep upon OTA arrival, easily able to arouse and agreeable to OT intervention with encouragement. Session focus on functional mobility via sit<>stands and functional stand pivot/ ambulatory ADL transfers.  Pt currently requires MOD A for bed mobility (supine>sitting). BP checked EOB 120/86. Pt completed stand pivot transfer from EOB>w/c with Rw and MIN A, MOD A needed to stand from EOB with MAX cues for hand placement as pt wanting to pull on RW. Pt transported to sink for seated grooming tasks with overall set- up assist for oral care, pt noted to be more shaky in hands dropping items multiple times during task. Pt requesting to transition outside to get some fresh air, transported pt outside with total A.  Once outside, pt completed multiple sit<>stand with Rw with assist ranging from MIN - MOD A, pt required MAX cues to carryover sternal precautions during mobility with pt presenting with heavy posterior lean in standing and increased tendency to push up with BUEs. Pt transported back to room with total A. Pt completed ambulatory transfer back to bed with RW with pt needing MOD A to ambulate ~ 90f d/t  strong posterior lean.  pt left supine in bed with with bed alarm activated and all needs within reach.                  Therapy Documentation Precautions:  Precautions Precautions: Fall, Sternal Restrictions Weight Bearing Restrictions: No Other Position/Activity Restrictions: sternal precautions  Pain: Pt reports 4/10 pain in chest, offered rest breaks repositioning and relaxation techniques as pain mgmt strategies.   Therapy/Group: Individual Therapy  MCorinne PortsKFour [facility] LLC9/07/2021, 4:00 PM

## 2021-08-22 ENCOUNTER — Inpatient Hospital Stay (HOSPITAL_COMMUNITY): Payer: 59

## 2021-08-22 ENCOUNTER — Ambulatory Visit (HOSPITAL_BASED_OUTPATIENT_CLINIC_OR_DEPARTMENT_OTHER): Payer: Self-pay | Admitting: Family

## 2021-08-22 LAB — CBC WITH DIFFERENTIAL/PLATELET
Abs Immature Granulocytes: 0.12 10*3/uL — ABNORMAL HIGH (ref 0.00–0.07)
Basophils Absolute: 0.1 10*3/uL (ref 0.0–0.1)
Basophils Relative: 0 %
Eosinophils Absolute: 0.2 10*3/uL (ref 0.0–0.5)
Eosinophils Relative: 1 %
HCT: 31.1 % — ABNORMAL LOW (ref 36.0–46.0)
Hemoglobin: 10.3 g/dL — ABNORMAL LOW (ref 12.0–15.0)
Immature Granulocytes: 1 %
Lymphocytes Relative: 8 %
Lymphs Abs: 1.4 10*3/uL (ref 0.7–4.0)
MCH: 28.5 pg (ref 26.0–34.0)
MCHC: 33.1 g/dL (ref 30.0–36.0)
MCV: 85.9 fL (ref 80.0–100.0)
Monocytes Absolute: 1.4 10*3/uL — ABNORMAL HIGH (ref 0.1–1.0)
Monocytes Relative: 8 %
Neutro Abs: 13.9 10*3/uL — ABNORMAL HIGH (ref 1.7–7.7)
Neutrophils Relative %: 82 %
Platelets: 456 10*3/uL — ABNORMAL HIGH (ref 150–400)
RBC: 3.62 MIL/uL — ABNORMAL LOW (ref 3.87–5.11)
RDW: 14.1 % (ref 11.5–15.5)
WBC: 17.1 10*3/uL — ABNORMAL HIGH (ref 4.0–10.5)
nRBC: 0 % (ref 0.0–0.2)

## 2021-08-22 LAB — RESP PANEL BY RT-PCR (FLU A&B, COVID) ARPGX2
Influenza A by PCR: NEGATIVE
Influenza B by PCR: NEGATIVE
SARS Coronavirus 2 by RT PCR: NEGATIVE

## 2021-08-22 LAB — BASIC METABOLIC PANEL
Anion gap: 8 (ref 5–15)
BUN: 21 mg/dL (ref 8–23)
CO2: 25 mmol/L (ref 22–32)
Calcium: 9 mg/dL (ref 8.9–10.3)
Chloride: 91 mmol/L — ABNORMAL LOW (ref 98–111)
Creatinine, Ser: 1.09 mg/dL — ABNORMAL HIGH (ref 0.44–1.00)
GFR, Estimated: 56 mL/min — ABNORMAL LOW (ref >60)
Glucose, Bld: 115 mg/dL — ABNORMAL HIGH (ref 70–99)
Potassium: 4.7 mmol/L (ref 3.5–5.1)
Sodium: 124 mmol/L — ABNORMAL LOW (ref 135–145)

## 2021-08-22 LAB — GLUCOSE, CAPILLARY
Glucose-Capillary: 88 mg/dL (ref 70–99)
Glucose-Capillary: 153 mg/dL — ABNORMAL HIGH (ref 70–99)
Glucose-Capillary: 117 mg/dL — ABNORMAL HIGH (ref 70–99)
Glucose-Capillary: 146 mg/dL — ABNORMAL HIGH (ref 70–99)

## 2021-08-22 LAB — PROCALCITONIN: Procalcitonin: 0.13 ng/mL

## 2021-08-22 MED ORDER — ACETAMINOPHEN 500 MG PO TABS
500.0000 mg | ORAL_TABLET | Freq: Four times a day (QID) | ORAL | Status: DC | PRN
  Administered 2021-08-22 – 2021-09-11 (×6): 500 mg via ORAL
  Filled 2021-08-22 (×6): qty 1

## 2021-08-22 MED ORDER — PIPERACILLIN-TAZOBACTAM 3.375 G IVPB
3.3750 g | Freq: Three times a day (TID) | INTRAVENOUS | Status: DC
  Administered 2021-08-22 – 2021-08-23 (×3): 3.375 g via INTRAVENOUS
  Filled 2021-08-22 (×4): qty 50

## 2021-08-22 MED ORDER — VANCOMYCIN HCL 1500 MG/300ML IV SOLN
1500.0000 mg | Freq: Once | INTRAVENOUS | Status: AC
  Administered 2021-08-22: 1500 mg via INTRAVENOUS
  Filled 2021-08-22: qty 300

## 2021-08-22 MED ORDER — VANCOMYCIN HCL IN DEXTROSE 1-5 GM/200ML-% IV SOLN
1000.0000 mg | INTRAVENOUS | Status: DC
  Filled 2021-08-22: qty 200

## 2021-08-22 MED ORDER — SODIUM CHLORIDE 0.9 % IV SOLN: INTRAVENOUS | Status: DC

## 2021-08-22 MED ORDER — LIDOCAINE 5 % EX PTCH
1.0000 | MEDICATED_PATCH | CUTANEOUS | Status: DC
  Administered 2021-08-22 – 2021-09-11 (×20): 1 via TRANSDERMAL
  Filled 2021-08-22 (×20): qty 1

## 2021-08-22 NOTE — Progress Notes (Signed)
Pharmacy Antibiotic Note  [name] is a 65 y.o. female admitted to [facility] on 08/11/2021 with Debility secondary to papillary fibroelastoma with significant stenosis.  Status post median sternotomy resection of fibroelastoma from aortic valve/CABG x4 08/04/2021.   On 9/90/22 Pharmacy has been consulted for Vancomycin and Zosyn dosing for pneumonia/CAP.  Plan: Zosyn 3.375 g IV q8h , EI Vancomycin 1500 mg x1 then1000 mg IV Q 24 hrs. Goal AUC 400-550. Expected AUC: 506  (used Vd 0.5 d/t BMI =30 SCr used: 1.09 Monitor clinical progress, renal function. Check steady state vancomycin levels per protocol if needed.    Height: '5\' 7"'$  (170.2 cm) Weight: 87 kg (191 lb 12.8 oz) IBW/kg (Calculated) : 61.6  Temp (24hrs), Avg:99.1 F (37.3 C), Min:98.5 F (36.9 C), Max:100.3 F (37.9 C)  Recent Labs  Lab 08/22/21 0827  WBC 17.1*  CREATININE 1.09*    Estimated Creatinine Clearance: 57.5 mL/min (A) (by C-G formula based on SCr of 1.09 mg/dL (H)).    Allergies  Allergen Reactions   Jardiance [Empagliflozin] Itching    Recurrent mycotic infections   Penicillins Rash and Other (See Comments)    Has patient had a PCN reaction causing immediate rash, facial/tongue/throat swelling, SOB or lightheadedness with hypotension: No Has patient had a PCN reaction causing severe rash involving mucus membranes or skin necrosis: No Has patient had a PCN reaction that required hospitalization No Has patient had a PCN reaction occurring within the last 10 years: No If all of the above answers are "NO", then may proceed with Cephalosporin use.     Antimicrobials this admission: Zosyn 9/9>> Vanc 9/9>>   Dose adjustments this admission:   Microbiology results: 9/8 UCx: sent   Thank you for allowing pharmacy to be a part of this patient's care.  [name] Cella, RPh Clinical Pharmacist [phone] 08/22/2021 12:20 PM  Please check AMION for all [facility] phone numbers After 10:00 PM,  call [facility] [phone]

## 2021-08-22 NOTE — Progress Notes (Signed)
Physical Therapy Session Note  Patient Details  Name: [name] MRN: [id number] Date of Birth: 20-Jan-1956  Today's Date: 08/22/2021 PT Individual Time: 0745-0815 PT Individual Time Calculation (min): 30 min  Short Term Goals: Week 2:  PT Short Term Goal 1 (Week 2): STG=LTG due to LOS  Skilled Therapeutic Interventions/Progress Updates:     Patient in bed asleep upon PT arrival. Patient slow to arouse and agreeable to PT session. Patient reported 5/10 sternal pain during session, RN made aware. PT provided repositioning, rest breaks, and distraction as pain interventions throughout session.   Orthostatic Vitals: Supine: BP 130/60, HR 78  Sitting: BP 117/78, HR 86  Therapeutic Activity: Bed Mobility: Donned B TED hose Patient performed rolling R/L with min A and cues for use of lower extremities to push to don pants with total A. Performed supine to sit with min-mod A for trunk support in a flat bed holding a pillow to her chest to maintain sternal precautions and pain management. Provided verbal cues for log roll technique with pillow held to her chest. Transfers: Patient performed sit to/from stand x1 and stand pivot x1 with min A and increased time to initiate and step to pivot. Provided verbal cues for initiation and sequencing.  Performed set-up assist for breakfast.   Patient seated in w/c in the room eating breakfast at end of session with breaks locked, seat belt alarm set, and all needs within reach.   Therapy Documentation Precautions:  Precautions Precautions: Fall, Sternal Restrictions Weight Bearing Restrictions: No Other Position/Activity Restrictions: sternal precautions    Therapy/Group: Individual Therapy  Eisa Conaway L Normand Damron PT, DPT  08/22/2021, 4:15 PM

## 2021-08-22 NOTE — Progress Notes (Addendum)
PROGRESS NOTE   Subjective/Complaints: Fatigued and not tolerating therapy as she did earlier this week. Labs ordered and WBC jumped to 17- abx started, UA normal, UC pending, Na down to 124: stopped Lasix and started on NS. Glucose down to 64: stopped glyburide and started fluids as above, CT ordered to assess for stroke given history but neurological exam stable.  Patient complains of feeling weak, sore in chest, pain in left hand- no other complains    ROS:  Pt denies SOB, abd pain, CP, N/V/C/D, and vision changes, + left hand pain   Objective:   No results found. Recent Labs    08/22/21 0827  WBC 17.1*  HGB 10.3*  HCT 31.1*  PLT 456*    Recent Labs    08/22/21 0827  NA 124*  K 4.7  CL 91*  CO2 25  GLUCOSE 115*  BUN 21  CREATININE 1.09*  CALCIUM 9.0     Intake/Output Summary (Last 24 hours) at 08/22/2021 1146 Last data filed at 08/22/2021 0800 Gross per 24 hour  Intake 600 ml  Output 550 ml  Net 50 ml         Physical Exam: Vital Signs Blood pressure 104/60, pulse 73, temperature 98.5 F (36.9 C), temperature source Oral, resp. rate 18, height '5\' 7"'$  (1.702 m), weight 87 kg, SpO2 93 %. Gen: no distress, normal appearing HEENT: oral mucosa pink and moist, NCAT Cardio: Reg rate Chest: normal effort, normal rate of breathing Abd: soft, non-distended Ext: no edema Psych: pleasant, normal affect Skin: intact Neuro: TTP over L hand- no signs of erythema or atrophy Musculoskeletal:  Ext: trace bilateral lower extremity edema Skin:    Comments: Midline chest incision clean and dry. Mild right thigh bruising Neurological:     Comments: Patient is alert.  No acute distress.  Follows commands and oriented x3 (month, not date).  She recalls her latest inpatient rehab admission. Residual left sided weakness from prior stroke, wrist drop. 5/5 throughout but 0/5 left wrist extension, 3/5 left wrist hand grip.    Assessment/Plan: 1. Functional deficits which require 3+ hours per day of interdisciplinary therapy in a comprehensive inpatient rehab setting. Physiatrist is providing close team supervision and 24 hour management of active medical problems listed below. Physiatrist and rehab team continue to assess barriers to discharge/monitor patient progress toward functional and medical goals  Care Tool:  Bathing    Body parts bathed by patient: Right arm, Left arm, Chest, Abdomen, Front perineal area, Face   Body parts bathed by helper: Right lower leg, Left lower leg     Bathing assist Assist Level: Contact Guard/Touching assist     Upper Body Dressing/Undressing Upper body dressing   What is the patient wearing?: Pull over shirt    Upper body assist Assist Level: Moderate Assistance - Patient 50 - 74%    Lower Body Dressing/Undressing Lower body dressing      What is the patient wearing?: Pants     Lower body assist Assist for lower body dressing: Total Assistance - Patient < 25%     Toileting Toileting    Toileting assist Assist for toileting: Maximal Assistance - Patient 25 -  49%     Transfers Chair/bed transfer  Transfers assist     Chair/bed transfer assist level: Minimal Assistance - Patient > 75%     Locomotion Ambulation   Ambulation assist      Assist level: Moderate Assistance - Patient 50 - 74% Assistive device: Walker-rolling Max distance: 60f   Walk 10 feet activity   Assist     Assist level: Moderate Assistance - Patient - 50 - 74% Assistive device: Walker-rolling   Walk 50 feet activity   Assist Walk 50 feet with 2 turns activity did not occur: Safety/medical concerns (fatigue, LE weakness, decreased balance/postural control, deconditioning)  Assist level: Moderate Assistance - Patient - 50 - 74%      Walk 150 feet activity   Assist Walk 150 feet activity did not occur: Safety/medical concerns (fatigue, LE weakness, decreased  balance/postural control, deconditioning)         Walk 10 feet on uneven surface  activity   Assist Walk 10 feet on uneven surfaces activity did not occur: Safety/medical concerns (fatigue, LE weakness, decreased balance/postural control, deconditioning)         Wheelchair     Assist Is the patient using a wheelchair?: Yes Type of Wheelchair: Manual Wheelchair activity did not occur: Safety/medical concerns (fatigue, LE weakness, decreased balance/postural control, deconditioning)         Wheelchair 50 feet with 2 turns activity    Assist    Wheelchair 50 feet with 2 turns activity did not occur: Safety/medical concerns (fatigue, LE weakness, decreased balance/postural control, deconditioning)       Wheelchair 150 feet activity     Assist  Wheelchair 150 feet activity did not occur: Safety/medical concerns (fatigue, LE weakness, decreased balance/postural control, deconditioning)       Blood pressure 104/60, pulse 73, temperature 98.5 F (36.9 C), temperature source Oral, resp. rate 18, height '5\' 7"'$  (1.702 m), weight 87 kg, SpO2 93 %.  Medical Problem List and Plan: 1.  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  Status post median sternotomy resection of fibroelastoma from aortic valve/CABG x4 08/04/2021.  Sternal precautions             -patient may shower but incision must be covered             -ELOS/Goals: 15 days, d/c 9/13         Continue CIR- PT, OT  2.  Impaired mobility, ambulation decreased from 40 to 3 feet -DVT/anticoagulation:  Pharmaceutical: Continue Lovenox             -antiplatelet therapy: Aspirin 325 mg daily 3. Post-stroke left hand pain: Robaxin and tramadol as needed. Add B6 '50mg'$  daily to improve sensitivity to pain. Decrease Gabapentin to '100mg'$  BID and '600mg'$  HS.  4. Depression/Anxiety Continue Zoloft 100 mg daily, decrease xanax to daily PRN             -antipsychotic agents: N/A 5. Neuropsych: This  patient is capable of making decisions on her own behalf. 6. Chest incision: Healing well, continue Routine skin checks 7. Fluids/Electrolytes/Nutrition: Routine in and outs with follow-up chemistries 8.ABLA. Hgb reviewed and stable, monitor weekly.  9.  Diabetes mellitus with peripheral neuropathy.  Hemoglobin A1c 7.8.  DiaBeta 5 mg daily, Glucophage 1000 mg twice daily. Discuss Qutenza 10  Hypertension.  restart norvasc 2.'5mg'$ , continue Lasix 40 mg daily, hydralazine 10 mg nightly, Cozaar 100 mg daily, Lopressor 12.5 mg twice daily.  Monitor with increased mobility 9/6- BP controlled- continue  regimen 11.  GERD.  Protonix 12.  Restless leg syndrome.  Requip nightly 13.  Hyperlipidemia.  Lipitor 14.  Obesity.  BMI 30.83.  Dietary follow-up 15.  History of right thalamic infarction 5/22.  Received CIR.  Continue aspirin. 16. Left wrist drop: splint ordered 17. Vitamin D deficiency: start ergocalciferol 50,000U once per week for 7 weeks 18. Suboptimal magnesium: start '250mg'$  magnesium gluconate HS. Continue  magnesium oxide '400mg'$  BID  9/5- likely why having loose stools- not on any bowel meds scheduled. Mg 1.7- con't regimen for now 19. Dry mouth: stop Cymbalta. Improved 20. Dizziness/weakness: UA normal, UC pending. CT ordered given significant decline and risk factors for stroke- negative.  21. Leukocytosis: IV vanc and zosyn started given severe decline in function, unknown source, WBC up to 17, CXR shows atelectasis, procal 0.13. Repeat procal and CBC tomorrow.  22. Hyponatremia to 124: NS started- lasix stopped. Repeat Na tomorrow 23. AKI: lasix stopped, fluids started. Will not fluid restrict given worsening creatinine. Repeat BMP tomorrow.     LOS: 11 days A FACE TO FACE EVALUATION WAS PERFORMED  Martha Clan P Kewana Sanon 08/22/2021, 11:46 AM

## 2021-08-22 NOTE — Progress Notes (Signed)
Occupational Therapy Session Note  Patient Details  Name: [name] MRN: [id number] Date of Birth: [birth year]  Today's Date: 08/23/2021 OT Individual Time: [id number] Individual Time Calculation (min): 14 min  31 minutes missed  Skilled Therapeutic Interventions/Progress Updates:    RN seen prior to tx in regards to pts CT scan. No new orders given, no CT results back yet so no therapy restrictions. Pt greeted, asleep, opened her eyes and told me "I'm falling apart." Tried conversing with her and also played a favorite song on the room computer but ultimately unable to increase alertness so that she could participate in tx. Informed MD when he arrived for assessment. Tx time missed.  Therapy Documentation Precautions:  Precautions Precautions: Fall, Sternal Restrictions Weight Bearing Restrictions: No Other Position/Activity Restrictions: sternal precautions General: General OT Amount of Missed Time: 31 Minutes Pain: pt denied pain   ADL: ADL Upper Body Bathing: Supervision/safety, Contact guard Where Assessed-Upper Body Bathing: Edge of bed Lower Body Bathing: Moderate assistance Where Assessed-Lower Body Bathing: Edge of bed Upper Body Dressing: Minimal assistance Where Assessed-Upper Body Dressing: Edge of bed Lower Body Dressing: Moderate assistance Where Assessed-Lower Body Dressing: Edge of bed Toileting: Maximal assistance Where Assessed-Toileting: Bedside Commode Toilet Transfer: Moderate assistance Toilet Transfer Method: Stand pivot Toilet Transfer Equipment: Bedside commode    Therapy/Group: Individual Therapy  Darrelyn Morro A Ysmael Hires 08/23/2021, 3:42 PM

## 2021-08-22 NOTE — Progress Notes (Signed)
Arrived for IV consult for IV placement, patient is currently up in wheelchair and going to the bathroom per the bedside RN.  Aware that IV team will return later as this process will take patient an extended period of time due to mobility assistance requirement for tolieting.

## 2021-08-22 NOTE — Progress Notes (Signed)
Patients rectal temperature was 102.9 F. On call MD Naaman Plummer notified. Prn tylenol was ordered, along with culture of midline incision that presented purulent drainage. Patient started on IV ABX (zozyn and vancomycin) during day shift. Patient last temperature was 25F. Patient resting in bed with call bell within reach, will continue with plan of care.

## 2021-08-22 NOTE — Progress Notes (Signed)
Occupational Therapy Session Note  Patient Details  Name: [name] MRN: [id number] Date of Birth: 1956/11/19  Today's Date: 08/22/2021 OT Individual Time: 0935-1000 OT Individual Time Calculation (min): 25 min    Short Term Goals: Week 2:  OT Short Term Goal 1 (Week 2): STG = LTGs due to remaining LOS  Skilled Therapeutic Interventions/Progress Updates:  Pt greeted supine in bed  agreeable to OT intervention. Pt reports wanting to work on her "hand."  Session focus on various therapeutic activities focused on functional grasp and release of ADL items and manipulation of items with L hand. Pt with increased difficulty reaching for lotions etc with her LUE, noted more ataxic movements in LUE this session. Of note pt unable to manipulate theraputty this session. Pt left supine in bed with all needs within reach and bed alarm activated.                        Therapy Documentation Precautions:  Precautions Precautions: Fall, Sternal Restrictions Weight Bearing Restrictions: No Other Position/Activity Restrictions: sternal precautions  Pain: Pt reports pain everywhere provided rest breaks and relaxation techniques as pain mgmt strategies.     Therapy/Group: Individual Therapy  Precious Haws 08/22/2021, 12:49 PM

## 2021-08-22 NOTE — Progress Notes (Signed)
Physical Therapy Session Note  Patient Details  Name: [name] MRN: [id number] Date of Birth: 1956/10/31  Today's Date: 08/22/2021 PT Individual Time: [id number] and [id number]  PT Individual Time Calculation (min): 25 min and 12 min PT Missed Time: 18 minutes PT Missed Time Reason: Fatigue and did not have access to proper equipment for airborne precautions  Short Term Goals: Week 1:  PT Short Term Goal 1 (Week 1): pt will perform bed mobility with min A consistantly PT Short Term Goal 1 - Progress (Week 1): Met PT Short Term Goal 2 (Week 1): pt will transfer bed<>chair with LRAD and min A PT Short Term Goal 2 - Progress (Week 1): Met PT Short Term Goal 3 (Week 1): pt will ambulate 10f with LRAD and min A PT Short Term Goal 3 - Progress (Week 1): Met Week 2:  PT Short Term Goal 1 (Week 2): STG=LTG due to LOS  Skilled Therapeutic Interventions/Progress Updates:   Treatment Session 1 Received pt supine in bed asleep with lights out, upon wakening pt reported feeling "worse" today than yesterday but when therapist asked pt to explain she stated "I can't do it" and fell back asleep. Pt extremely lethargic and declined OOB mobility but ultimately agreed to bed level exercises with maximal encouragement. Pt performed the following exercises in bed with decreased participation and fluctuating arousal/attention/focus: -SLR x10 bilaterally -AAROM on LLE -heel slides x10 bilaterally -AAROM on LLE  -hip abduction x10 bilaterally -hip adduction pillow squeezes x10  Pt seems is aware of increased lethargy and expressed concern to therapist but is aware that MD has changed medications. BP semi-reclined with ted hose donned: 113/58. Concluded session with pt semi-reclined in bed, needs within reach, and bed alarm on.   Treatment Session 2  Received pt semi-reclined in bed reporting pain along sternum near incision. Inspected incision and noted increased redness at distal aspect. RN notified of  pt's request for pain medication. Per RN, took 3 people to assist pt back to bed earlier. Noted pt's lunch tray untouched and encouraged pt to attempt to eat. With encouragement, pt ate 2 bites of meat but unable to sequence/motor plan picking up food with fork using RUE, requiring total A from therapist. Therapist assisted pt in calling in her daughter due to increased weakness in RUE. RN then informed therapist that pt will now be placed on airborne precautions to await COVID testing. Concluded session with pt semi-reclined in bed, needs within reach, and bed alarm on. 18 minutes missed of skilled physical therapy due to fatigue and no access to proper equipment for airborne precautions.   Therapy Documentation Precautions:  Precautions Precautions: Fall, Sternal Restrictions Weight Bearing Restrictions: No Other Position/Activity Restrictions: sternal precautions  Therapy/Group: Individual Therapy AAlfonse AlpersPT, DPT   08/22/2021, 7:24 AM

## 2021-08-22 NOTE — Progress Notes (Signed)
Occupational Therapy Session Note  Patient Details  Name: [name] MRN: [id number] Date of Birth: [birth year]  Today's Date: 08/22/2021 OT Individual Time: [phone] and [id number] OT Individual Time Calculation (min): 53 min and 8 mins Missed 22 mins due to fatigue and difficulty maintaining arousal   Short Term Goals: Week 2:  OT Short Term Goal 1 (Week 2): STG = LTGs due to remaining LOS  Skilled Therapeutic Interventions/Progress Updates:    1) Treatment session with focus on self-care retraining, functional transfers, and endurance.  Pt received upright in recliner expressing desire to shower.  Discussed current arousal level and increased assistance required for mobility but pt still wanting to attempt.  Therapist positioned w/c next to shower and had pt completed stand pivot transfer to shower chair, requiring mod assist for sit > stand and cues for safety as pt attempting to sit prematurely.  Therapist providing constant CGA during bathing and frequent checks due to fluctuating arousal with pt stating "I'm fine" each time.  Mod assist sit > stand for peri hygiene.  Pt completed stand pivot transfer out of shower with mod assist.  Pt reports fatigue and requesting to get dressed in bed.  Pt completed stand pivot transfer w/c > bed min assist.  Pt donned shirt with mod assist due to decreased attention to task d/t arousal and decreased coordination of LUE.  Pt returned to supine and was total assist for donning pants due to fatigue and weakness.  Pt states "I like to sleep, but this is a bit much".  Pt remained semi-reclined in bed with all needs in reach.  Spoke with MD about concerns with decreased mobility, shakiness and decreased grasp in LUE, increased fatigue.  MD to decrease some meds and check CT if indicated.  2) Limited treatment session due to fatigue and decreased arousal.  Pt in bed asleep, easily aroused but unable to maintain arousal.  Pt did state, "I don't like this, I  just can't stay awake".  Pt declined any activity due to not feeling well and multiple AM therapy sessions.  MD notified of therapist observations and concerns. Stat CT ordered due to medical changes.    Therapy Documentation Precautions:  Precautions Precautions: Fall, Sternal Restrictions Weight Bearing Restrictions: No Other Position/Activity Restrictions: sternal precautions  Pain: Pain Assessment Pain Scale: 0-10 Pain Score: 5  Faces Pain Scale: No hurt Pain Type: Acute pain Pain Location: Rib cage Pain Orientation: Left Pain Descriptors / Indicators: Sore Pain Frequency: Intermittent Pain Onset: On-going Patients Stated Pain Goal: 0 Pain Intervention(s): Medication (See eMAR);Repositioned   Therapy/Group: Individual Therapy  Simonne Come 08/22/2021, 10:15 AM

## 2021-08-23 ENCOUNTER — Inpatient Hospital Stay (HOSPITAL_COMMUNITY): Payer: 59

## 2021-08-23 DIAGNOSIS — E871 Hypo-osmolality and hyponatremia: Secondary | ICD-10-CM

## 2021-08-23 DIAGNOSIS — R509 Fever, unspecified: Secondary | ICD-10-CM

## 2021-08-23 DIAGNOSIS — N179 Acute kidney failure, unspecified: Secondary | ICD-10-CM

## 2021-08-23 LAB — BASIC METABOLIC PANEL
Anion gap: 12 (ref 5–15)
Anion gap: 10 (ref 5–15)
BUN: 30 mg/dL — ABNORMAL HIGH (ref 8–23)
BUN: 35 mg/dL — ABNORMAL HIGH (ref 8–23)
CO2: 23 mmol/L (ref 22–32)
CO2: 22 mmol/L (ref 22–32)
Calcium: 8.5 mg/dL — ABNORMAL LOW (ref 8.9–10.3)
Calcium: 8.2 mg/dL — ABNORMAL LOW (ref 8.9–10.3)
Chloride: 89 mmol/L — ABNORMAL LOW (ref 98–111)
Chloride: 94 mmol/L — ABNORMAL LOW (ref 98–111)
Creatinine, Ser: 1.76 mg/dL — ABNORMAL HIGH (ref 0.44–1.00)
Creatinine, Ser: 1.98 mg/dL — ABNORMAL HIGH (ref 0.44–1.00)
GFR, Estimated: 32 mL/min — ABNORMAL LOW (ref >60)
GFR, Estimated: 27 mL/min — ABNORMAL LOW (ref >60)
Glucose, Bld: 203 mg/dL — ABNORMAL HIGH (ref 70–99)
Glucose, Bld: 111 mg/dL — ABNORMAL HIGH (ref 70–99)
Potassium: 5.6 mmol/L — ABNORMAL HIGH (ref 3.5–5.1)
Potassium: 5.3 mmol/L — ABNORMAL HIGH (ref 3.5–5.1)
Sodium: 124 mmol/L — ABNORMAL LOW (ref 135–145)
Sodium: 126 mmol/L — ABNORMAL LOW (ref 135–145)

## 2021-08-23 LAB — GLUCOSE, CAPILLARY
Glucose-Capillary: 193 mg/dL — ABNORMAL HIGH (ref 70–99)
Glucose-Capillary: 154 mg/dL — ABNORMAL HIGH (ref 70–99)

## 2021-08-23 LAB — CBC WITH DIFFERENTIAL/PLATELET
Abs Immature Granulocytes: 0.21 10*3/uL — ABNORMAL HIGH (ref 0.00–0.07)
Basophils Absolute: 0.1 10*3/uL (ref 0.0–0.1)
Basophils Relative: 1 %
Eosinophils Absolute: 0.1 10*3/uL (ref 0.0–0.5)
Eosinophils Relative: 0 %
HCT: 28.5 % — ABNORMAL LOW (ref 36.0–46.0)
Hemoglobin: 9.3 g/dL — ABNORMAL LOW (ref 12.0–15.0)
Immature Granulocytes: 1 %
Lymphocytes Relative: 4 %
Lymphs Abs: 0.8 10*3/uL (ref 0.7–4.0)
MCH: 28.1 pg (ref 26.0–34.0)
MCHC: 32.6 g/dL (ref 30.0–36.0)
MCV: 86.1 fL (ref 80.0–100.0)
Monocytes Absolute: 1.6 10*3/uL — ABNORMAL HIGH (ref 0.1–1.0)
Monocytes Relative: 8 %
Neutro Abs: 17.8 10*3/uL — ABNORMAL HIGH (ref 1.7–7.7)
Neutrophils Relative %: 86 %
Platelets: 409 10*3/uL — ABNORMAL HIGH (ref 150–400)
RBC: 3.31 MIL/uL — ABNORMAL LOW (ref 3.87–5.11)
RDW: 14.1 % (ref 11.5–15.5)
WBC: 20.6 10*3/uL — ABNORMAL HIGH (ref 4.0–10.5)
nRBC: 0 % (ref 0.0–0.2)

## 2021-08-23 LAB — URINE CULTURE: Culture: NO GROWTH

## 2021-08-23 LAB — PROCALCITONIN: Procalcitonin: 0.64 ng/mL

## 2021-08-23 MED ORDER — SODIUM ZIRCONIUM CYCLOSILICATE 5 G PO PACK
5.0000 g | PACK | Freq: Once | ORAL | Status: AC
  Administered 2021-08-23: 5 g via ORAL
  Filled 2021-08-23: qty 1

## 2021-08-23 MED ORDER — SODIUM CHLORIDE 0.9 % IV SOLN
2.0000 g | INTRAVENOUS | Status: DC
  Administered 2021-08-23: 2 g via INTRAVENOUS
  Filled 2021-08-23: qty 2

## 2021-08-23 MED ORDER — VANCOMYCIN HCL IN DEXTROSE 1-5 GM/200ML-% IV SOLN
1000.0000 mg | INTRAVENOUS | Status: DC
  Filled 2021-08-23: qty 200

## 2021-08-23 MED ORDER — GABAPENTIN 300 MG PO CAPS
600.0000 mg | ORAL_CAPSULE | Freq: Every day | ORAL | Status: DC
  Administered 2021-08-24 – 2021-09-11 (×19): 600 mg via ORAL
  Filled 2021-08-23 (×19): qty 2

## 2021-08-23 MED ORDER — VANCOMYCIN HCL IN DEXTROSE 1-5 GM/200ML-% IV SOLN
1000.0000 mg | INTRAVENOUS | Status: DC
  Administered 2021-08-23: 1000 mg via INTRAVENOUS
  Filled 2021-08-23: qty 200

## 2021-08-23 MED ORDER — GABAPENTIN 100 MG PO CAPS
100.0000 mg | ORAL_CAPSULE | Freq: Two times a day (BID) | ORAL | Status: DC
  Administered 2021-08-25 – 2021-09-12 (×37): 100 mg via ORAL
  Filled 2021-08-23 (×37): qty 1

## 2021-08-23 NOTE — Progress Notes (Signed)
Occupational Therapy Session Note  Patient Details  Name: [name] MRN: [id number] Date of Birth: Apr 29, 1956  60 minutes missed  Skilled Therapeutic Interventions/Progress Updates:    Pt unable to participate in group today due to sx this AM. 60 minutes missed.  Therapy Documentation Precautions:  Precautions Precautions: Fall, Sternal Restrictions Weight Bearing Restrictions: No Other Position/Activity Restrictions: sternal precautions General: General OT Amount of Missed Time: 60 Minutes    Therapy/Group: Group Therapy  Tawanda Schall A Vernel Donlan 08/23/2021, 6:07 PM

## 2021-08-23 NOTE — Progress Notes (Addendum)
      [address]       [place] [address]             [phone]       Subjective:  Patient awoken from sleep, states she didn't know she has a fever  Objective: Vital signs in last 24 hours: Temp:  [98 F (36.7 C)-102.9 F (39.4 C)] 98 F (36.7 C) (09/10 0519) Pulse Rate:  [79-99] 79 (09/10 0519) Resp:  [18-20] 18 (09/10 0519) BP: (90-123)/(55-67) 109/66 (09/10 0519) SpO2:  [88 %-98 %] 96 % (09/10 0519)  Intake/Output from previous day: 09/09 0701 - 09/10 0700 In: 569.2 [P.O.:360; I.V.:209.2] Out: 425 [Urine:425]   Sternal Wound: Inferior Portion with erythema... no dehiscence is present, however when palpated brown purulent material was expressed  Lab Results: Recent Labs    08/22/21 0827 08/23/21 0607  WBC 17.1* 20.6*  HGB 10.3* 9.3*  HCT 31.1* 28.5*  PLT 456* 409*   BMET:  Recent Labs    08/22/21 0827 08/23/21 0607  NA 124* 124*  K 4.7 5.6*  CL 91* 89*  CO2 25 23  GLUCOSE 115* 203*  BUN 21 30*  CREATININE 1.09* 1.76*  CALCIUM 9.0 8.5*    PT/INR: No results for input(s): LABPROT, INR in the last 72 hours. ABG    Component Value Date/Time   PHART 7.346 (L) 08/05/2021 0105   HCO3 26.2 08/05/2021 0105   TCO2 28 08/05/2021 0105   ACIDBASEDEF 1.0 08/04/2021 2140   O2SAT 98.0 08/05/2021 0105   CBG (last 3)  Recent Labs    08/22/21 1653 08/22/21 2054 08/23/21 0613  GLUCAP 117* 146* 193*    Assessment/Plan:  Patient with new onset fever, with erythema and brown purulence from inferior portion of sternal wound.  Agree with IV ABX, workup has been negative so far in regards to other sources of infection.  Unfortunately, I think the patients wound will ultimately benefit from being opened up with wound vac placement.   However will review CT scan once completed to determine treatment course moving forward.  LOS: 12 days   Ellwood Handler, PA-C  08/23/2021   Chart reviewed, patient examined, agree with above. She has had  recent mental status change with lethargy, spiked fever to 103 last night, had increase in WBC to 20.6 from 17 yesterday 11.1 a weeks ago, noted to have drainage from the lower end of sternal incision yesterday that is growing staph aureus. Chest CT this am shows that sternum is intact. There is some subcutaneous stranding and air suggesting infection. Procal was 0.13 yesterday and 0.64 today. Creat up to 1.76 from 1.09 yesterday and she is hyponatremic to 124 with K+ 5.6. Diuretic stopped and NS started. I think this will require drainage in the OR tomorrow and placement of a wound VAC. She has been started on empiric vanc and Maxepime.Urine is negative and I don't see much in the lungs except some linear atelectasis.

## 2021-08-23 NOTE — Progress Notes (Signed)
PROGRESS NOTE   Subjective/Complaints: Temp 102.9 last  night. Pt slept comfortably though. Slow to arouse when I saw her at 0630.    ROS: Limited due to cognitive/behavioral    Objective:   DG Chest 2 View  Result Date: 08/22/2021 CLINICAL DATA:  Mid chest pain after PT. History of aortic valve tumor resection in a 65 year old female. EXAM: CHEST - 2 VIEW COMPARISON:  August 07, 2021. FINDINGS: Post median sternotomy for CABG. Heart size moderately enlarged and accentuated by AP technique. Linear airspace disease in the LEFT chest with improved aeration since previous imaging. No sign of lobar consolidation. No evidence of pleural effusion. No pneumothorax. On limited assessment there is no acute skeletal process. IMPRESSION: Moderate cardiomegaly. Linear airspace disease in the LEFT chest with improved aeration since previous imaging. Findings likely reflect atelectasis. Electronically Signed   By: Zetta Bills M.D.   On: 08/22/2021 15:56   CT HEAD WO CONTRAST (5MM)  Result Date: 08/22/2021 CLINICAL DATA:  Mental status changes. EXAM: CT HEAD WITHOUT CONTRAST TECHNIQUE: Contiguous axial images were obtained from the base of the skull through the vertex without intravenous contrast. COMPARISON:  04/18/2021 FINDINGS: Brain: There is no evidence for acute hemorrhage, hydrocephalus, mass lesion, or abnormal extra-axial fluid collection. No definite CT evidence for acute infarction. Vascular: No hyperdense vessel or unexpected calcification. Skull: No evidence for fracture. No worrisome lytic or sclerotic lesion. Sinuses/Orbits: The visualized paranasal sinuses and mastoid air cells are clear. Visualized portions of the globes and intraorbital fat are unremarkable. Other: None. IMPRESSION: 1. Unremarkable study.  No acute intracranial abnormality. Electronically Signed   By: Misty Stanley M.D.   On: 08/22/2021 14:16   Recent Labs     08/22/21 0827 08/23/21 0607  WBC 17.1* 20.6*  HGB 10.3* 9.3*  HCT 31.1* 28.5*  PLT 456* 409*    Recent Labs    08/22/21 0827 08/23/21 0607  NA 124* 124*  K 4.7 5.6*  CL 91* 89*  CO2 25 23  GLUCOSE 115* 203*  BUN 21 30*  CREATININE 1.09* 1.76*  CALCIUM 9.0 8.5*     Intake/Output Summary (Last 24 hours) at 08/23/2021 0811 Last data filed at 08/23/2021 0530 Gross per 24 hour  Intake 409.24 ml  Output 425 ml  Net -15.76 ml         Physical Exam: Vital Signs Blood pressure 109/66, pulse 79, temperature 98 F (36.7 C), temperature source Oral, resp. rate 18, height '5\' 7"'$  (1.702 m), weight 87 kg, SpO2 96 %. Constitutional: No distress . Vital signs reviewed. HEENT: NCAT, EOMI, oral membranes moist Neck: supple Cardiovascular: RRR without murmur. No JVD    Respiratory/Chest: CTA Bilaterally without wheezes or rales. Normal effort    GI/Abdomen: BS +, non-tender, non-distended Ext: no clubbing, cyanosis, tr edema Psych: slow to arouse Skin:    Comments: Midline chest incision:   Neurological:     Comments:slow to arouse. Does respond to tactile and verbal cues. Residual left sided weakness from prior stroke, wrist drop. 5/5 throughout but 0/5 left wrist extension, 3/5 left wrist hand grip.   Assessment/Plan: 1. Functional deficits which require 3+ hours per day of interdisciplinary therapy in  a comprehensive inpatient rehab setting. Physiatrist is providing close team supervision and 24 hour management of active medical problems listed below. Physiatrist and rehab team continue to assess barriers to discharge/monitor patient progress toward functional and medical goals  Care Tool:  Bathing    Body parts bathed by patient: Right arm, Left arm, Chest, Abdomen, Front perineal area, Face   Body parts bathed by helper: Right lower leg, Left lower leg     Bathing assist Assist Level: Contact Guard/Touching assist     Upper Body Dressing/Undressing Upper body  dressing   What is the patient wearing?: Pull over shirt    Upper body assist Assist Level: Moderate Assistance - Patient 50 - 74%    Lower Body Dressing/Undressing Lower body dressing      What is the patient wearing?: Pants     Lower body assist Assist for lower body dressing: Total Assistance - Patient < 25%     Toileting Toileting    Toileting assist Assist for toileting: Maximal Assistance - Patient 25 - 49%     Transfers Chair/bed transfer  Transfers assist     Chair/bed transfer assist level: Minimal Assistance - Patient > 75%     Locomotion Ambulation   Ambulation assist      Assist level: Moderate Assistance - Patient 50 - 74% Assistive device: Walker-rolling Max distance: 71f   Walk 10 feet activity   Assist     Assist level: Moderate Assistance - Patient - 50 - 74% Assistive device: Walker-rolling   Walk 50 feet activity   Assist Walk 50 feet with 2 turns activity did not occur: Safety/medical concerns (fatigue, LE weakness, decreased balance/postural control, deconditioning)  Assist level: Moderate Assistance - Patient - 50 - 74%      Walk 150 feet activity   Assist Walk 150 feet activity did not occur: Safety/medical concerns (fatigue, LE weakness, decreased balance/postural control, deconditioning)         Walk 10 feet on uneven surface  activity   Assist Walk 10 feet on uneven surfaces activity did not occur: Safety/medical concerns (fatigue, LE weakness, decreased balance/postural control, deconditioning)         Wheelchair     Assist Is the patient using a wheelchair?: Yes Type of Wheelchair: Manual Wheelchair activity did not occur: Safety/medical concerns (fatigue, LE weakness, decreased balance/postural control, deconditioning)         Wheelchair 50 feet with 2 turns activity    Assist    Wheelchair 50 feet with 2 turns activity did not occur: Safety/medical concerns (fatigue, LE weakness,  decreased balance/postural control, deconditioning)       Wheelchair 150 feet activity     Assist  Wheelchair 150 feet activity did not occur: Safety/medical concerns (fatigue, LE weakness, decreased balance/postural control, deconditioning)       Blood pressure 109/66, pulse 79, temperature 98 F (36.7 C), temperature source Oral, resp. rate 18, height '5\' 7"'$  (1.702 m), weight 87 kg, SpO2 96 %.  Medical Problem List and Plan: 1.  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  Status post median sternotomy resection of fibroelastoma from aortic valve/CABG x4 08/04/2021.  Sternal precautions             -patient may shower but incision must be covered             -ELOS/Goals: 15 days, d/c 9/13         Continue CIR- PT, OT  2.  Impaired mobility, ambulation decreased from  40 to 3 feet -DVT/anticoagulation:  Pharmaceutical: Continue Lovenox             -antiplatelet therapy: Aspirin 325 mg daily 3. Post-stroke left hand pain: Robaxin and tramadol as needed. Add B6 '50mg'$  daily to improve sensitivity to pain. Decrease Gabapentin to '100mg'$  BID and '600mg'$  HS.   -hold gabapentin today 9/10 4. Depression/Anxiety Continue Zoloft 100 mg daily, decrease xanax to daily PRN             -antipsychotic agents: N/A 5. Neuropsych: This patient is capable of making decisions on her own behalf. 6. Chest incision: Healing well, continue Routine skin checks 7. Fluids/Electrolytes/Nutrition: Routine in and outs with follow-up chemistries 8.ABLA. Hgb reviewed and stable, monitor weekly.  9.  Diabetes mellitus with peripheral neuropathy.  Hemoglobin A1c 7.8.  DiaBeta 5 mg daily, Glucophage 1000 mg twice daily. Discuss Qutenza 10  Hypertension.  restart norvasc 2.'5mg'$ , continue Lasix 40 mg daily, hydralazine 10 mg nightly, Cozaar 100 mg daily, Lopressor 12.5 mg twice daily.  Monitor with increased mobility 9/10- BP controlled- continue regimen 11.  GERD.  Protonix 12.  Restless leg  syndrome.  Requip nightly 13.  Hyperlipidemia.  Lipitor 14.  Obesity.  BMI 30.83.  Dietary follow-up 15.  History of right thalamic infarction 5/22.  Received CIR.  Continue aspirin. 16. Left wrist drop: splint ordered 17. Vitamin D deficiency: start ergocalciferol 50,000U once per week for 7 weeks 18. Suboptimal magnesium: start '250mg'$  magnesium gluconate HS. Continue  magnesium oxide '400mg'$  BID    19. Dry mouth: stop Cymbalta. Improved 20. Dizziness/weakness: UA normal, UC pending. CT ordered given significant decline and risk factors for stroke- negative.  21. Leukocytosis: IV vanc and zosyn started given severe decline in function, unknown source, WBC up to 20k from 17k yesterday -CXR shows atelectasis, procal 0.13.    -ua neg, ucx pending, no diarrhea -given wound appearance will check ct chest and abd, no iv contrast given rising Cr -contact CT surgeons 22. Hyponatremia to 124: NS started- lasix stopped 9/9  -BUN/Cr still rising, continue IVF at 75cc/hr, recheck at 1500 23. AKI: lasix stopped, fluids started. See above    LOS: 12 days A FACE TO [place] 08/23/2021, 8:11 AM

## 2021-08-23 NOTE — Progress Notes (Addendum)
Pharmacy Antibiotic Note  [name] is a 65 y.o. female admitted to [name] on 08/11/2021 with Debility secondary to papillary fibroelastoma with significant stenosis.  Status post median sternotomy resection of fibroelastoma from aortic valve/CABG x4 on 8/22.  On 9/9, pharmacy was consulted for Vancomycin and Zosyn dosing for pneumonia.    Patient's Scr elevated this morning at 1.76, WBC trend up to 20.6, Tm 100.7 > afebrile.  Chest wound culture pending, moderate GPC.  Per discussion with MD, will discontinue Zosyn, start Cefepime, and adjust Vancomycin dose due to acute change in renal function.   Plan: Stop Zosyn Start Cefepime 2g IV q24h Change Vancomycin to 1000 mg IV q36h Goal AUC 400-550 Expected AUC 514.7 (used Vd 0.5 d/t BMI 30) Scr used: 1.76 Monitor clinical progress, renal function, LOT Check steady state vancomycin levels per protocol as needed  Height: '5\' 7"'$  (170.2 cm) Weight: 87 kg (191 lb 12.8 oz) IBW/kg (Calculated) : 61.6  Temp (24hrs), Avg:99.9 F (37.7 C), Min:98 F (36.7 C), Max:102.9 F (39.4 C)  Recent Labs  Lab 08/22/21 0827 08/23/21 0607  WBC 17.1* 20.6*  CREATININE 1.09* 1.76*     Estimated Creatinine Clearance: 35.6 mL/min (A) (by C-G formula based on SCr of 1.76 mg/dL (H)).    Allergies  Allergen Reactions   Jardiance [Empagliflozin] Itching    Recurrent mycotic infections   Penicillins Rash and Other (See Comments)    Has patient had a PCN reaction causing immediate rash, facial/tongue/throat swelling, SOB or lightheadedness with hypotension: No Has patient had a PCN reaction causing severe rash involving mucus membranes or skin necrosis: No Has patient had a PCN reaction that required hospitalization No Has patient had a PCN reaction occurring within the last 10 years: No If all of the above answers are "NO", then may proceed with Cephalosporin use.     Antimicrobials this admission: Cefepime 9/10>> Vanc 9/9>> Zosyn  9/9>>9/10  Dose adjustments this admission: 9/10 Vanc changed to 1000 mg IV q36h (d/t elev Scr)  Microbiology results: 9/9 Chest wound cx: pending, moderate GPC 9/9 Resp panel: neg 9/8 UCx: ng   Thank you for allowing pharmacy to be a part of this patient's care.  Vance Peper, PharmD PGY1 Pharmacy Resident Phone [phone] 08/23/2021 10:03 AM   Please check AMION for all [place] phone numbers After 10:00 PM, call [place] [phone]

## 2021-08-23 NOTE — H&P (View-Only) (Signed)
      [address]       [place] [address]             [phone]       Subjective:  Patient awoken from sleep, states she didn't know she has a fever  Objective: Vital signs in last 24 hours: Temp:  [98 F (36.7 C)-102.9 F (39.4 C)] 98 F (36.7 C) (09/10 0519) Pulse Rate:  [79-99] 79 (09/10 0519) Resp:  [18-20] 18 (09/10 0519) BP: (90-123)/(55-67) 109/66 (09/10 0519) SpO2:  [88 %-98 %] 96 % (09/10 0519)  Intake/Output from previous day: 09/09 0701 - 09/10 0700 In: 569.2 [P.O.:360; I.V.:209.2] Out: 425 [Urine:425]   Sternal Wound: Inferior Portion with erythema... no dehiscence is present, however when palpated brown purulent material was expressed  Lab Results: Recent Labs    08/22/21 0827 08/23/21 0607  WBC 17.1* 20.6*  HGB 10.3* 9.3*  HCT 31.1* 28.5*  PLT 456* 409*   BMET:  Recent Labs    08/22/21 0827 08/23/21 0607  NA 124* 124*  K 4.7 5.6*  CL 91* 89*  CO2 25 23  GLUCOSE 115* 203*  BUN 21 30*  CREATININE 1.09* 1.76*  CALCIUM 9.0 8.5*    PT/INR: No results for input(s): LABPROT, INR in the last 72 hours. ABG    Component Value Date/Time   PHART 7.346 (L) 08/05/2021 0105   HCO3 26.2 08/05/2021 0105   TCO2 28 08/05/2021 0105   ACIDBASEDEF 1.0 08/04/2021 2140   O2SAT 98.0 08/05/2021 0105   CBG (last 3)  Recent Labs    08/22/21 1653 08/22/21 2054 08/23/21 0613  GLUCAP 117* 146* 193*    Assessment/Plan:  Patient with new onset fever, with erythema and brown purulence from inferior portion of sternal wound.  Agree with IV ABX, workup has been negative so far in regards to other sources of infection.  Unfortunately, I think the patients wound will ultimately benefit from being opened up with wound vac placement.   However will review CT scan once completed to determine treatment course moving forward.  LOS: 12 days   Ellwood Handler, PA-C  08/23/2021   Chart reviewed, patient examined, agree with above. She has had  recent mental status change with lethargy, spiked fever to 103 last night, had increase in WBC to 20.6 from 17 yesterday 11.1 a weeks ago, noted to have drainage from the lower end of sternal incision yesterday that is growing staph aureus. Chest CT this am shows that sternum is intact. There is some subcutaneous stranding and air suggesting infection. Procal was 0.13 yesterday and 0.64 today. Creat up to 1.76 from 1.09 yesterday and she is hyponatremic to 124 with K+ 5.6. Diuretic stopped and NS started. I think this will require drainage in the OR tomorrow and placement of a wound VAC. She has been started on empiric vanc and Maxepime.Urine is negative and I don't see much in the lungs except some linear atelectasis.

## 2021-08-23 NOTE — Progress Notes (Signed)
Physical Therapy Session Note  Patient Details  Name: [name] MRN: [id number] Date of Birth: May 26, 1956  Today's Date: 08/23/2021 PT Individual Time: 6808-8110  PT Individual Time Calculation (min): 25 min    Short Term Goals: Week 1:  PT Short Term Goal 1 (Week 1): pt will perform bed mobility with min A consistantly PT Short Term Goal 1 - Progress (Week 1): Met PT Short Term Goal 2 (Week 1): pt will transfer bed<>chair with LRAD and min A PT Short Term Goal 2 - Progress (Week 1): Met PT Short Term Goal 3 (Week 1): pt will ambulate 51f with LRAD and min A PT Short Term Goal 3 - Progress (Week 1): Met Week 2:  PT Short Term Goal 1 (Week 2): STG=LTG due to LOS  Skilled Therapeutic Interventions/Progress Updates:  Chart review reveals that pt has been placed on contact/ airborne precautions via nursing order as prophylactic measure for COVID testing. Appropraite PPE unavailable at room and located on 4W.  Patient supine in bed asleep on entrance to room. Pt requires time and considerable effort to arouse. On waking, pt not alert and asking for phone. On waking again, pt turns to side and relates need to toilet. W/c and RW arranged for transfer to toilet, however pt as fallen asleep again and snoring. Unable to fully rouse for safe transfer to toilet.   Patient with no pain complaint throughout session however is not fully aware of self and unaware that she has a fever.   Patient left supine  in bed at end of session with brakes locked, bed alarm set, and all needs within reach.  On later return to room, pt is being fed breakfast by RN as she is apparently unable to feed self.     Therapy Documentation Precautions:  Precautions Precautions: Fall, Sternal Restrictions Weight Bearing Restrictions: No Other Position/Activity Restrictions: sternal precautions General: PT Missed Time: 35 min PT Missed Treatment Reason: Patient ill (fever; unable to fully rouse); Patient fatigue     Pain: No complaint of pain from pt throughout session.   Therapy/Group: Individual Therapy  JAlger SimonsPT, DPT 08/23/2021, 8:29 AM

## 2021-08-24 ENCOUNTER — Inpatient Hospital Stay (HOSPITAL_COMMUNITY): Payer: 59 | Admitting: Anesthesiology

## 2021-08-24 ENCOUNTER — Encounter (HOSPITAL_COMMUNITY)
Admission: RE | Disposition: A | Discharge status: Home Health | Payer: Self-pay | Source: Intra-hospital | Attending: Physical Medicine and Rehabilitation

## 2021-08-24 ENCOUNTER — Encounter (HOSPITAL_COMMUNITY): Payer: Self-pay | Admitting: Physical Medicine and Rehabilitation

## 2021-08-24 DIAGNOSIS — I9789 Other postprocedural complications and disorders of the circulatory system, not elsewhere classified: Secondary | ICD-10-CM

## 2021-08-24 HISTORY — PX: APPLICATION OF WOUND VAC: SHX5189

## 2021-08-24 HISTORY — PX: STERNAL WOUND DEBRIDEMENT: SHX1058

## 2021-08-24 LAB — CBC
HCT: 27.3 % — ABNORMAL LOW (ref 36.0–46.0)
Hemoglobin: 9 g/dL — ABNORMAL LOW (ref 12.0–15.0)
MCH: 28.2 pg (ref 26.0–34.0)
MCHC: 33 g/dL (ref 30.0–36.0)
MCV: 85.6 fL (ref 80.0–100.0)
Platelets: 360 10*3/uL (ref 150–400)
RBC: 3.19 MIL/uL — ABNORMAL LOW (ref 3.87–5.11)
RDW: 14.1 % (ref 11.5–15.5)
WBC: 13.5 10*3/uL — ABNORMAL HIGH (ref 4.0–10.5)
nRBC: 0 % (ref 0.0–0.2)

## 2021-08-24 LAB — BASIC METABOLIC PANEL
Anion gap: 11 (ref 5–15)
BUN: 31 mg/dL — ABNORMAL HIGH (ref 8–23)
CO2: 23 mmol/L (ref 22–32)
Calcium: 8.2 mg/dL — ABNORMAL LOW (ref 8.9–10.3)
Chloride: 94 mmol/L — ABNORMAL LOW (ref 98–111)
Creatinine, Ser: 1.5 mg/dL — ABNORMAL HIGH (ref 0.44–1.00)
GFR, Estimated: 38 mL/min — ABNORMAL LOW (ref >60)
Glucose, Bld: 164 mg/dL — ABNORMAL HIGH (ref 70–99)
Potassium: 4.5 mmol/L (ref 3.5–5.1)
Sodium: 128 mmol/L — ABNORMAL LOW (ref 135–145)

## 2021-08-24 LAB — GLUCOSE, CAPILLARY
Glucose-Capillary: 104 mg/dL — ABNORMAL HIGH (ref 70–99)
Glucose-Capillary: 142 mg/dL — ABNORMAL HIGH (ref 70–99)
Glucose-Capillary: 151 mg/dL — ABNORMAL HIGH (ref 70–99)
Glucose-Capillary: 162 mg/dL — ABNORMAL HIGH (ref 70–99)
Glucose-Capillary: 133 mg/dL — ABNORMAL HIGH (ref 70–99)
Glucose-Capillary: 128 mg/dL — ABNORMAL HIGH (ref 70–99)
Glucose-Capillary: 159 mg/dL — ABNORMAL HIGH (ref 70–99)
Glucose-Capillary: 217 mg/dL — ABNORMAL HIGH (ref 70–99)

## 2021-08-24 LAB — SURGICAL PCR SCREEN
MRSA, PCR: NEGATIVE
Staphylococcus aureus: POSITIVE — AB

## 2021-08-24 LAB — PROCALCITONIN: Procalcitonin: 0.64 ng/mL

## 2021-08-24 SURGERY — DEBRIDEMENT, WOUND, STERNUM
Anesthesia: General

## 2021-08-24 MED ORDER — LACTATED RINGERS IV SOLN: INTRAVENOUS | Status: DC

## 2021-08-24 MED ORDER — EPHEDRINE SULFATE 50 MG/ML IJ SOLN
INTRAMUSCULAR | Status: DC | PRN
  Administered 2021-08-24: 10 mg via INTRAVENOUS

## 2021-08-24 MED ORDER — CHLORHEXIDINE GLUCONATE CLOTH 2 % EX PADS
6.0000 | MEDICATED_PAD | Freq: Every day | CUTANEOUS | Status: AC
  Administered 2021-08-24 – 2021-08-28 (×5): 6 via TOPICAL

## 2021-08-24 MED ORDER — ORAL CARE MOUTH RINSE: 15.0000 mL | Freq: Once | OROMUCOSAL | Status: AC

## 2021-08-24 MED ORDER — SODIUM CHLORIDE 0.9 % IV SOLN
2.0000 g | Freq: Two times a day (BID) | INTRAVENOUS | Status: DC
  Administered 2021-08-24 – 2021-08-25 (×3): 2 g via INTRAVENOUS
  Filled 2021-08-24 (×3): qty 2

## 2021-08-24 MED ORDER — 0.9 % SODIUM CHLORIDE (POUR BTL) OPTIME
TOPICAL | Status: DC | PRN
  Administered 2021-08-24: 1000 mL

## 2021-08-24 MED ORDER — DEXAMETHASONE SODIUM PHOSPHATE 10 MG/ML IJ SOLN
INTRAMUSCULAR | Status: DC | PRN
  Administered 2021-08-24: 10 mg via INTRAVENOUS

## 2021-08-24 MED ORDER — ONDANSETRON HCL 4 MG/2ML IJ SOLN
INTRAMUSCULAR | Status: AC
  Filled 2021-08-24: qty 4

## 2021-08-24 MED ORDER — MUPIROCIN 2 % EX OINT
1.0000 "application " | TOPICAL_OINTMENT | Freq: Two times a day (BID) | CUTANEOUS | Status: AC
  Administered 2021-08-24 – 2021-08-27 (×8): 1 via NASAL
  Filled 2021-08-24 (×2): qty 22

## 2021-08-24 MED ORDER — LIDOCAINE 2% (20 MG/ML) 5 ML SYRINGE
INTRAMUSCULAR | Status: AC
  Filled 2021-08-24: qty 10

## 2021-08-24 MED ORDER — CHLORHEXIDINE GLUCONATE 0.12 % MT SOLN
OROMUCOSAL | Status: AC
  Administered 2021-08-24: 15 mL via OROMUCOSAL
  Filled 2021-08-24: qty 15

## 2021-08-24 MED ORDER — VANCOMYCIN HCL 750 MG/150ML IV SOLN
750.0000 mg | INTRAVENOUS | Status: DC
  Administered 2021-08-24: 750 mg via INTRAVENOUS
  Filled 2021-08-24 (×2): qty 150

## 2021-08-24 MED ORDER — PHENYLEPHRINE HCL (PRESSORS) 10 MG/ML IV SOLN
INTRAVENOUS | Status: DC | PRN
  Administered 2021-08-24: 120 ug via INTRAVENOUS
  Administered 2021-08-24: 160 ug via INTRAVENOUS

## 2021-08-24 MED ORDER — FENTANYL CITRATE (PF) 250 MCG/5ML IJ SOLN
INTRAMUSCULAR | Status: DC | PRN
  Administered 2021-08-24 (×2): 50 ug via INTRAVENOUS

## 2021-08-24 MED ORDER — ROCURONIUM BROMIDE 10 MG/ML (PF) SYRINGE
PREFILLED_SYRINGE | INTRAVENOUS | Status: AC
  Filled 2021-08-24: qty 20

## 2021-08-24 MED ORDER — ONDANSETRON HCL 4 MG/2ML IJ SOLN: 4.0000 mg | Freq: Once | INTRAMUSCULAR | Status: DC | PRN

## 2021-08-24 MED ORDER — DEXAMETHASONE SODIUM PHOSPHATE 10 MG/ML IJ SOLN
INTRAMUSCULAR | Status: AC
  Filled 2021-08-24: qty 2

## 2021-08-24 MED ORDER — OXYCODONE HCL 5 MG PO TABS: 5.0000 mg | ORAL_TABLET | Freq: Once | ORAL | Status: DC | PRN

## 2021-08-24 MED ORDER — PHENYLEPHRINE 40 MCG/ML (10ML) SYRINGE FOR IV PUSH (FOR BLOOD PRESSURE SUPPORT)
PREFILLED_SYRINGE | INTRAVENOUS | Status: AC
  Filled 2021-08-24: qty 20

## 2021-08-24 MED ORDER — PROPOFOL 10 MG/ML IV BOLUS
INTRAVENOUS | Status: DC | PRN
  Administered 2021-08-24: 100 mg via INTRAVENOUS

## 2021-08-24 MED ORDER — VANCOMYCIN HCL 1000 MG IV SOLR
INTRAVENOUS | Status: AC
  Filled 2021-08-24: qty 20

## 2021-08-24 MED ORDER — OXYCODONE HCL 5 MG/5ML PO SOLN
5.0000 mg | Freq: Once | ORAL | Status: DC | PRN
Start: 2021-08-24 — End: 2021-08-24

## 2021-08-24 MED ORDER — FENTANYL CITRATE (PF) 100 MCG/2ML IJ SOLN: 25.0000 ug | INTRAMUSCULAR | Status: DC | PRN

## 2021-08-24 MED ORDER — LIDOCAINE 2% (20 MG/ML) 5 ML SYRINGE
INTRAMUSCULAR | Status: DC | PRN
  Administered 2021-08-24: 60 mg via INTRAVENOUS

## 2021-08-24 MED ORDER — ONDANSETRON HCL 4 MG/2ML IJ SOLN
INTRAMUSCULAR | Status: DC | PRN
  Administered 2021-08-24: 4 mg via INTRAVENOUS

## 2021-08-24 MED ORDER — FENTANYL CITRATE (PF) 250 MCG/5ML IJ SOLN
INTRAMUSCULAR | Status: AC
  Filled 2021-08-24: qty 5

## 2021-08-24 MED ORDER — MIDAZOLAM HCL 2 MG/2ML IJ SOLN
INTRAMUSCULAR | Status: AC
  Filled 2021-08-24: qty 2

## 2021-08-24 MED ORDER — CHLORHEXIDINE GLUCONATE 0.12 % MT SOLN: 15.0000 mL | Freq: Once | OROMUCOSAL | Status: AC

## 2021-08-24 MED ORDER — PHENYLEPHRINE HCL-NACL 20-0.9 MG/250ML-% IV SOLN
INTRAVENOUS | Status: DC | PRN
  Administered 2021-08-24: 25 ug/min via INTRAVENOUS

## 2021-08-24 MED ORDER — ALBUMIN HUMAN 5 % IV SOLN: INTRAVENOUS | Status: DC | PRN

## 2021-08-24 MED ORDER — EPHEDRINE 5 MG/ML INJ
INTRAVENOUS | Status: AC
  Filled 2021-08-24: qty 10

## 2021-08-24 SURGICAL SUPPLY — 30 items
ATTRACTOMAT 16X20 MAGNETIC DRP (DRAPES) ×2 IMPLANT
BAG DECANTER FOR FLEXI CONT (MISCELLANEOUS) ×2 IMPLANT
BLADE SURG 10 STRL SS (BLADE) ×4 IMPLANT
BNDG GAUZE ELAST 4 BULKY (GAUZE/BANDAGES/DRESSINGS) IMPLANT
CANISTER SUCT 3000ML PPV (MISCELLANEOUS) ×2 IMPLANT
CANISTER WOUND CARE 500ML ATS (WOUND CARE) ×1 IMPLANT
CLIP VESOCCLUDE SM WIDE 24/CT (CLIP) IMPLANT
COVER SURGICAL LIGHT HANDLE (MISCELLANEOUS) ×3 IMPLANT
DRAPE LAPAROSCOPIC ABDOMINAL (DRAPES) ×2 IMPLANT
DRSG PAD ABDOMINAL 8X10 ST (GAUZE/BANDAGES/DRESSINGS) ×6 IMPLANT
DRSG VAC ATS SM SENSATRAC (GAUZE/BANDAGES/DRESSINGS) ×1 IMPLANT
ELECT REM PT RETURN 9FT ADLT (ELECTROSURGICAL) ×2
ELECTRODE REM PT RTRN 9FT ADLT (ELECTROSURGICAL) ×1 IMPLANT
GAUZE SPONGE 4X4 12PLY STRL (GAUZE/BANDAGES/DRESSINGS) ×2 IMPLANT
GLOVE SURG MICRO LTX SZ7 (GLOVE) ×4 IMPLANT
GOWN STRL REUS W/ TWL LRG LVL3 (GOWN DISPOSABLE) ×4 IMPLANT
GOWN STRL REUS W/ TWL XL LVL3 (GOWN DISPOSABLE) ×1 IMPLANT
GOWN STRL REUS W/TWL LRG LVL3 (GOWN DISPOSABLE) ×6
GOWN STRL REUS W/TWL XL LVL3 (GOWN DISPOSABLE) ×2
KIT BASIN OR (CUSTOM PROCEDURE TRAY) ×2 IMPLANT
KIT TURNOVER KIT B (KITS) ×2 IMPLANT
NS IRRIG 1000ML POUR BTL (IV SOLUTION) ×2 IMPLANT
PACK GENERAL/GYN (CUSTOM PROCEDURE TRAY) ×1 IMPLANT
PAD ARMBOARD 7.5X6 YLW CONV (MISCELLANEOUS) ×4 IMPLANT
SPONGE T-LAP 18X18 ~~LOC~~+RFID (SPONGE) ×2 IMPLANT
SWAB CULTURE ESWAB REG 1ML (MISCELLANEOUS) ×1 IMPLANT
SWAB CULTURE LIQ STUART DBL (MISCELLANEOUS) ×1 IMPLANT
TOWEL GREEN STERILE (TOWEL DISPOSABLE) ×2 IMPLANT
TOWEL GREEN STERILE FF (TOWEL DISPOSABLE) ×2 IMPLANT
WATER STERILE IRR 1000ML POUR (IV SOLUTION) ×2 IMPLANT

## 2021-08-24 NOTE — Progress Notes (Signed)
Patient arrived from PACU back to room. Awake and partially sleepy due to anesthesia. Oriented to self, place, and situation.  Denies pain. Wound vac intact to mid sternum at 125.  Serosanguinous drainage noted in wound vac container.

## 2021-08-24 NOTE — Op Note (Signed)
CARDIOVASCULAR SURGERY OPERATIVE NOTE  08/24/2021 [name] [id number]  Surgeon:  Gaye Pollack, MD  First Assistant: RNFA    Preoperative Diagnosis:  Superficial sternal wound infection   Postoperative Diagnosis:  Same   Procedure:  Incision and debridement of sternal wound 2.   Application of wound VAC therapy   Anesthesia:  General Endotracheal   Clinical History/Surgical Indication:  She has had recent mental status change with lethargy, spiked fever to 103, had increase in WBC to 20.6 from 17 two days prior and  11.1 a week ago, noted to have drainage from the lower end of sternal incision that is growing staph aureus. Chest CT shows that the sternum is intact. There is some subcutaneous stranding and air suggesting infection. Procal was 0.13  and 0.64. Creat up to 1.76 from 1.09  and she is hyponatremic to 124 with K+ 5.6. Diuretic stopped and NS started.  I think the sternal wound needs to be opened and debrided inferiorly.  I discussed the operative procedure with the patient including alternatives, benefits, and risks and she understands and agrees to proceed.     Preparation:  The patient was seen in the preoperative holding area and the correct patient, correct operation were confirmed with the patient. The consent was signed by me. Preoperative antibiotics were given. The patient was taken back to the operating room and positioned supine on the operating room table. After being placed under general endotracheal anesthesia by the anesthesia team the neck, chest, and abdomen were prepped with betadine soap and solution and draped in the usual sterile manner. A surgical time-out was taken and the correct patient and operative procedure were confirmed with the nursing and anesthesia staff.  Sternal wound incision and debridement:  The lower one third of the sternal incision was opened immediately releasing a large amount of creamy white pus.  This was cultured.  It  tracked superiorly along the subcutaneous plane for short distance but it was only the lower one third of the wound involved.  The fascia below the xiphoid process had separated and the sutures were removed. The sternum itself appeared intact and was not exposed.  There was some fibrinous exudate on the subcutaneous tissue and this was excised.  In general the tissues appeared quite viable.  The wound was irrigated with saline solution.  A small wound VAC sponge was placed and the plastic dressing applied.  There was a good seal.  The patient was awakened, extubated, and transferred to the postanesthesia care unit in satisfactory condition.

## 2021-08-24 NOTE — Anesthesia Procedure Notes (Signed)
Procedure Name: Intubation Date/Time: 08/24/2021 1:54 PM Performed by: Clearnce Sorrel, CRNA Pre-anesthesia Checklist: Patient identified, Emergency Drugs available, Suction available and Patient being monitored Patient Re-evaluated:Patient Re-evaluated prior to induction Oxygen Delivery Method: Circle System Utilized Preoxygenation: Pre-oxygenation with 100% oxygen Induction Type: IV induction Ventilation: Mask ventilation without difficulty LMA Size: 4.0 Tube type: Oral Number of attempts: 1 Airway Equipment and Method: Stylet and Oral airway Placement Confirmation: ETT inserted through vocal cords under direct vision, positive ETCO2 and breath sounds checked- equal and bilateral Tube secured with: Tape Dental Injury: Teeth and Oropharynx as per pre-operative assessment

## 2021-08-24 NOTE — Progress Notes (Signed)
Pharmacy Antibiotic Note  [name] is a 65 y.o. female admitted to [facility] on 08/11/2021 with Debility secondary to papillary fibroelastoma with significant stenosis.  Status post median sternotomy resection of fibroelastoma from aortic valve/CABG x4 on 8/22.  Pharmacy has been consulted for Vancomycin and Cefepime dosing for pneumonia.    Patient's Scr this morning down to 1.50, WBC trend down to 13.5, remains afebrile.  Chest wound culture pending, with moderate GPC, moderate staphylococcus aureus.  Will adjust Vancomycin and Cefepime dosing per improving renal function.   Plan: Cefepime 2g IV q12h Vancomycin 750 mg IV q24h Goal AUC 400-550 Expected AUC 486 (used Vd 0.5 d/t BMI 30) Scr used: 1.50 Monitor clinical progress, renal function, LOT Check steady state vancomycin levels per protocol as needed  Height: '5\' 7"'$  (170.2 cm) Weight: 90.3 kg (199 lb 1.2 oz) IBW/kg (Calculated) : 61.6  Temp (24hrs), Avg:98.9 F (37.2 C), Min:98.3 F (36.8 C), Max:99.4 F (37.4 C)  Recent Labs  Lab 08/22/21 0827 08/23/21 0607 08/23/21 1451 08/24/21 0440  WBC 17.1* 20.6*  --  13.5*  CREATININE 1.09* 1.76* 1.98* 1.50*     Estimated Creatinine Clearance: 42.6 mL/min (A) (by C-G formula based on SCr of 1.5 mg/dL (H)).    Allergies  Allergen Reactions   Jardiance [Empagliflozin] Itching    Recurrent mycotic infections   Penicillins Rash and Other (See Comments)    Has patient had a PCN reaction causing immediate rash, facial/tongue/throat swelling, SOB or lightheadedness with hypotension: No Has patient had a PCN reaction causing severe rash involving mucus membranes or skin necrosis: No Has patient had a PCN reaction that required hospitalization No Has patient had a PCN reaction occurring within the last 10 years: No If all of the above answers are "NO", then may proceed with Cephalosporin use.     Antimicrobials this admission: Cefepime 9/10>> Vanc 9/9>> Zosyn  9/9>>9/10  Dose adjustments this admission: 9/11 Cefepime changed to 2g IV q12h; Vanc changed to 750 mg IV q24h (d/t improving renal fx) 9/10 Vanc changed to 1000 mg IV q36h (d/t elev Scr)  Microbiology results: 9/9 Chest wound cx: pending, moderate GPC, moderate staphylococcus aureus 9/9 Resp panel: negative 9/8 UCx: ng   Thank you for allowing pharmacy to be a part of this patient's care.  Vance Peper, PharmD PGY1 Pharmacy Resident Phone [phone] 08/24/2021 10:56 AM   Please check AMION for all [facility] phone numbers After 10:00 PM, call [place] [phone]

## 2021-08-24 NOTE — Transfer of Care (Signed)
Immediate Anesthesia Transfer of Care Note  Patient: SHANEKWA MOLT  Procedure(s) Performed: STERNAL WOUND DRAINAGE AND DEBRIDEMENT APPLICATION OF WOUND VAC  Patient Location: PACU  Anesthesia Type:General  Level of Consciousness: drowsy  Airway & Oxygen Therapy: Patient Spontanous Breathing and Patient connected to nasal cannula oxygen  Post-op Assessment: Report given to RN and Post -op Vital signs reviewed and stable  Post vital signs: Reviewed and stable  Last Vitals:  Vitals Value Taken Time  BP 121/55 08/24/21 1447  Temp 36.3 C 08/24/21 1447  Pulse 89 08/24/21 1454  Resp 12 08/24/21 1454  SpO2 93 % 08/24/21 1454  Vitals shown include unvalidated device data.  Last Pain:  Vitals:   08/24/21 1447  TempSrc:   PainSc: 0-No pain      Patients Stated Pain Goal: 3 ([id number])  Complications: No notable events documented.

## 2021-08-24 NOTE — Interval H&P Note (Signed)
History and Physical Interval Note:  08/24/2021 12:58 PM  [name]  has presented today for surgery, with the diagnosis of sternal wound infection.  The various methods of treatment have been discussed with the patient and family. After consideration of risks, benefits and other options for treatment, the patient has consented to  Procedure(s): STERNAL WOUND DRAINAGE AND DEBRIDEMENT (N/A) APPLICATION OF WOUND VAC (N/A) as a surgical intervention.  The patient's history has been reviewed, patient examined, no change in status, stable for surgery.  I have reviewed the patient's chart and labs.  Questions were answered to the patient's satisfaction.     Gaye Pollack

## 2021-08-24 NOTE — Anesthesia Postprocedure Evaluation (Signed)
Anesthesia Post Note  Patient: [name]  Procedure(s) Performed: STERNAL WOUND DRAINAGE AND DEBRIDEMENT APPLICATION OF WOUND VAC     Patient location during evaluation: PACU Anesthesia Type: General Level of consciousness: awake and alert Pain management: pain level controlled Vital Signs Assessment: post-procedure vital signs reviewed and stable Respiratory status: spontaneous breathing, nonlabored ventilation, respiratory function stable and patient connected to nasal cannula oxygen Cardiovascular status: stable and blood pressure returned to baseline Anesthetic complications: no   No notable events documented.  Last Vitals:  Vitals:   08/24/21 1502 08/24/21 1517  BP: 122/61 (!) 115/58  Pulse: 93 89  Resp: 12 12  Temp:  36.6 C  SpO2: 94% 97%    Last Pain:  Vitals:   08/24/21 1517  TempSrc:   PainSc: 0-No pain                 [name]

## 2021-08-24 NOTE — Progress Notes (Signed)
Patient picked up by OR staff for planned procedure. Remains alert and oriented x 3 with confusion to time.  Preop checklist updated.  Patient bladder scanned with 662m of urine measured.  In and out cath. Performed and obtained 7049m of clear yellow urine.

## 2021-08-24 NOTE — Anesthesia Preprocedure Evaluation (Addendum)
Anesthesia Evaluation  Patient identified by MRN, date of birth, ID bandGeneral Assessment Comment: Drowsy but arousable   Reviewed: Allergy & Precautions, NPO status , Patient's Chart, lab work & pertinent test results, reviewed documented beta blocker date and time   History of Anesthesia Complications Negative for: history of anesthetic complications  Airway Mallampati: II  TM Distance: >3 FB Neck ROM: Full    Dental  (+) Partial Lower, Dental Advisory Given   Pulmonary former smoker,    Pulmonary exam normal        Cardiovascular hypertension, Pt. on medications and Pt. on home beta blockers Normal cardiovascular exam   '22 Carotid US - 1-39% b/l ICAS  '22 TEE - EF 60 to 65%. Prominent papillary muscle is noted in RV. LA size was mildly dilated. Trileaflet aortic valve with large well defined shimmering highly mobile mass on the aortic side of the valve. 3D images obtained to try to delineate the origin of the mass as difficult to discern whether this is originating from the valve leaflet or the aortic wall. 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 No AS or AI.  '22 Coronary CT - Disease/stenosis noted mainly in RCA, LAD, and LCx   Neuro/Psych PSYCHIATRIC DISORDERS Anxiety Depression CVA (left sided weakness), Residual Symptoms    GI/Hepatic Neg liver ROS, GERD  Medicated and Controlled,  Endo/Other  diabetes, Type 2, Oral Hypoglycemic Agents, Insulin Dependent Obesity   Renal/GU negative Renal ROS  Female GU complaint     Musculoskeletal  (+) Arthritis ,   Abdominal   Peds  Hematology negative hematology ROS (+)   Anesthesia Other Findings   Reproductive/Obstetrics                            Anesthesia Physical Anesthesia Plan  ASA: 3  Anesthesia Plan: General   Post-op Pain Management:    Induction:  Intravenous  PONV Risk Score and Plan: 3 and Treatment may vary due to age or medical condition, Ondansetron and Dexamethasone  Airway Management Planned: LMA  Additional Equipment: None  Intra-op Plan:   Post-operative Plan: Extubation in OR  Informed Consent: I have reviewed the patients History and Physical, chart, labs and discussed the procedure including the risks, benefits and alternatives for the proposed anesthesia with the patient or authorized representative who has indicated his/her understanding and acceptance.     Dental advisory given  Plan Discussed with: CRNA and Anesthesiologist  Anesthesia Plan Comments:        Anesthesia Quick Evaluation

## 2021-08-24 NOTE — Progress Notes (Signed)
Patient alert and able to comprehend information about surgical procedure.

## 2021-08-24 NOTE — Progress Notes (Signed)
PROGRESS NOTE   Subjective/Complaints: Had an uneventful night. Low grade temp. Awake when I entered this morning.   ROS: Limited due to cognitive/behavioral    Objective:   DG Chest 2 View  Result Date: 08/22/2021 CLINICAL DATA:  Mid chest pain after PT. History of aortic valve tumor resection in a 65 year old female. EXAM: CHEST - 2 VIEW COMPARISON:  August 07, 2021. FINDINGS: Post median sternotomy for CABG. Heart size moderately enlarged and accentuated by AP technique. Linear airspace disease in the LEFT chest with improved aeration since previous imaging. No sign of lobar consolidation. No evidence of pleural effusion. No pneumothorax. On limited assessment there is no acute skeletal process. IMPRESSION: Moderate cardiomegaly. Linear airspace disease in the LEFT chest with improved aeration since previous imaging. Findings likely reflect atelectasis. Electronically Signed   By: Zetta Bills M.D.   On: 08/22/2021 15:56   CT HEAD WO CONTRAST (5MM)  Result Date: 08/22/2021 CLINICAL DATA:  Mental status changes. EXAM: CT HEAD WITHOUT CONTRAST TECHNIQUE: Contiguous axial images were obtained from the base of the skull through the vertex without intravenous contrast. COMPARISON:  04/18/2021 FINDINGS: Brain: There is no evidence for acute hemorrhage, hydrocephalus, mass lesion, or abnormal extra-axial fluid collection. No definite CT evidence for acute infarction. Vascular: No hyperdense vessel or unexpected calcification. Skull: No evidence for fracture. No worrisome lytic or sclerotic lesion. Sinuses/Orbits: The visualized paranasal sinuses and mastoid air cells are clear. Visualized portions of the globes and intraorbital fat are unremarkable. Other: None. IMPRESSION: 1. Unremarkable study.  No acute intracranial abnormality. Electronically Signed   By: Misty Stanley M.D.   On: 08/22/2021 14:16   CT CHEST ABDOMEN PELVIS WO  CONTRAST  Result Date: 08/23/2021 CLINICAL DATA:  Fever of unknown origin. Recent chest surgery, would beginning to open. EXAM: CT CHEST, ABDOMEN AND PELVIS WITHOUT CONTRAST TECHNIQUE: Multidetector CT imaging of the chest, abdomen and pelvis was performed following the standard protocol without IV contrast. COMPARISON:  None. FINDINGS: CT CHEST FINDINGS Cardiovascular: Changes from recent right breast surgery. Mildly enlarged heart. Small pericardial effusion measuring up to 7 mm in thickness. Calcific atherosclerotic disease of the coronary arteries. Mediastinum/Nodes: No enlarged mediastinal, hilar, or axillary lymph nodes. Thyroid gland, trachea, and esophagus demonstrate no significant findings. Lungs/Pleura: Hypoventilatory changes in bilateral lung bases. Musculoskeletal: Sequela of recent median sternotomy. No significant callus formation at the sternotomy line. Additionally, there is a soft tissue stranding anterior and posterior to the sternotomy with a small less than 2 cm phlegmon and gas collection in the anterior mediastinum. Small foci of gas are also seen anterior to the sternum within the areas of soft tissue stranding. CT ABDOMEN PELVIS FINDINGS Hepatobiliary: No focal liver abnormality is seen. No gallstones, gallbladder wall thickening, or biliary dilatation. Pancreas: Unremarkable. No pancreatic ductal dilatation or surrounding inflammatory changes. Spleen: Normal in size without focal abnormality. Adrenals/Urinary Tract: Adrenal glands are unremarkable. Kidneys are normal, without renal calculi, focal lesion, or hydronephrosis. Bladder is unremarkable. Stomach/Bowel: Stomach is within normal limits. Appendix appears normal. No evidence of bowel wall thickening, distention, or inflammatory changes. Scattered colonic diverticulosis. Vascular/Lymphatic: Aortic atherosclerosis. No enlarged abdominal or pelvic lymph nodes. Reproductive: Uterus and bilateral  adnexa are unremarkable. Other: No  abdominal wall hernia or abnormality. No abdominopelvic ascites. Musculoskeletal: Prior right hip arthroplasty. Spondylosis of the lumbosacral spine. IMPRESSION: 1. Sequela of recent median sternotomy. No significant callus formation at the sternotomy line. Additionally, there is a soft tissue stranding anterior and posterior to the sternotomy with a small less than 2 cm phlegmon and gas collection in the anterior mediastinum. Additional small foci of gas present anterior to the sternum within the areas of subcutaneous soft tissue stranding. These findings may represent postsurgical changes, however infection is also entirely possible. 2. Small pericardial effusion measuring up to 7 mm in thickness. 3. Calcific atherosclerotic disease of the coronary arteries. 4. No evidence of acute abnormalities within the abdomen or pelvis. 5. Scattered colonic diverticulosis without evidence of diverticulitis. 6. Aortic atherosclerosis. Aortic Atherosclerosis (ICD10-I70.0). Electronically Signed   By: Fidela Salisbury M.D.   On: 08/23/2021 14:57   Recent Labs    08/23/21 0607 08/24/21 0440  WBC 20.6* 13.5*  HGB 9.3* 9.0*  HCT 28.5* 27.3*  PLT 409* 360    Recent Labs    08/23/21 1451 08/24/21 0440  NA 126* 128*  K 5.3* 4.5  CL 94* 94*  CO2 22 23  GLUCOSE 111* 164*  BUN 35* 31*  CREATININE 1.98* 1.50*  CALCIUM 8.2* 8.2*     Intake/Output Summary (Last 24 hours) at 08/24/2021 0659 Last data filed at 08/24/2021 0521 Gross per 24 hour  Intake 2459.18 ml  Output 500 ml  Net 1959.18 ml         Physical Exam: Vital Signs Blood pressure (!) 108/56, pulse 93, temperature 99.4 F (37.4 C), temperature source Oral, resp. rate 18, height '5\' 7"'$  (1.702 m), weight 90.3 kg, SpO2 97 %. Constitutional: No distress . Vital signs reviewed. HEENT: NCAT, EOMI, oral membranes moist Neck: supple Cardiovascular: RRR without murmur. No JVD    Respiratory/Chest: CTA Bilaterally without wheezes or rales. Normal  effort    GI/Abdomen: BS +, non-tender, non-distended Ext: no clubbing, cyanosis, or edema Psych: still a little lethargic but more appropriate and alert Skin:    Comments: Midline chest incision:appears improved today with less drainage  Neurological:     Comments:makes eye contact. Follows simple commands. Still a little slow to process but demonstrates awareness of what's going on.  Residual left sided weakness from prior stroke, wrist drop. 5/5 throughout but 0/5 left wrist extension, 3/5 left wrist hand grip.   Assessment/Plan: 1. Functional deficits which require 3+ hours per day of interdisciplinary therapy in a comprehensive inpatient rehab setting. Physiatrist is providing close team supervision and 24 hour management of active medical problems listed below. Physiatrist and rehab team continue to assess barriers to discharge/monitor patient progress toward functional and medical goals  Care Tool:  Bathing    Body parts bathed by patient: Right arm, Left arm, Chest, Abdomen, Front perineal area, Face   Body parts bathed by helper: Right lower leg, Left lower leg     Bathing assist Assist Level: Contact Guard/Touching assist     Upper Body Dressing/Undressing Upper body dressing   What is the patient wearing?: Pull over shirt    Upper body assist Assist Level: Moderate Assistance - Patient 50 - 74%    Lower Body Dressing/Undressing Lower body dressing      What is the patient wearing?: Pants     Lower body assist Assist for lower body dressing: Total Assistance - Patient < 25%     Toileting Toileting  Toileting assist Assist for toileting: Maximal Assistance - Patient 25 - 49%     Transfers Chair/bed transfer  Transfers assist     Chair/bed transfer assist level: Minimal Assistance - Patient > 75%     Locomotion Ambulation   Ambulation assist      Assist level: Moderate Assistance - Patient 50 - 74% Assistive device: Walker-rolling Max  distance: 7f   Walk 10 feet activity   Assist     Assist level: Moderate Assistance - Patient - 50 - 74% Assistive device: Walker-rolling   Walk 50 feet activity   Assist Walk 50 feet with 2 turns activity did not occur: Safety/medical concerns (fatigue, LE weakness, decreased balance/postural control, deconditioning)  Assist level: Moderate Assistance - Patient - 50 - 74%      Walk 150 feet activity   Assist Walk 150 feet activity did not occur: Safety/medical concerns (fatigue, LE weakness, decreased balance/postural control, deconditioning)         Walk 10 feet on uneven surface  activity   Assist Walk 10 feet on uneven surfaces activity did not occur: Safety/medical concerns (fatigue, LE weakness, decreased balance/postural control, deconditioning)         Wheelchair     Assist Is the patient using a wheelchair?: Yes Type of Wheelchair: Manual Wheelchair activity did not occur: Safety/medical concerns (fatigue, LE weakness, decreased balance/postural control, deconditioning)         Wheelchair 50 feet with 2 turns activity    Assist    Wheelchair 50 feet with 2 turns activity did not occur: Safety/medical concerns (fatigue, LE weakness, decreased balance/postural control, deconditioning)       Wheelchair 150 feet activity     Assist  Wheelchair 150 feet activity did not occur: Safety/medical concerns (fatigue, LE weakness, decreased balance/postural control, deconditioning)       Blood pressure (!) 108/56, pulse 93, temperature 99.4 F (37.4 C), temperature source Oral, resp. rate 18, height '5\' 7"'$  (1.702 m), weight 90.3 kg, SpO2 97 %.  Medical Problem List and Plan: 1.  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  Status post median sternotomy resection of fibroelastoma from aortic valve/CABG x4 08/04/2021.  Sternal precautions             -ELOS/Goals: 15 days, d/c 9/13 likely on hold given medical  -to  OR today for wound exploration, clean out and vac placement         Continue CIR- PT, OT  2.  Impaired mobility, ambulation decreased from 40 to 3 feet -DVT/anticoagulation:  Pharmaceutical: Continue Lovenox             -antiplatelet therapy: Aspirin 325 mg daily 3. Post-stroke left hand pain: Robaxin and tramadol as needed. Add B6 '50mg'$  daily to improve sensitivity to pain. Decrease Gabapentin to '100mg'$  BID and '600mg'$  HS.   -continue hold gabapentin d/t lethargy 4. Depression/Anxiety Continue Zoloft 100 mg daily, decrease xanax to daily PRN             -antipsychotic agents: N/A 5. Neuropsych: This patient is capable of making decisions on her own behalf. 6. Chest incision: Healing well, continue Routine skin checks 7. Fluids/Electrolytes/Nutrition: Routine in and outs with follow-up chemistries 8.ABLA. Hgb reviewed and stable, monitor weekly.  9.  Diabetes mellitus with peripheral neuropathy.  Hemoglobin A1c 7.8.  DiaBeta 5 mg daily, Glucophage 1000 mg twice daily. Discuss Qutenza 10  Hypertension.  restart norvasc 2.'5mg'$ , continue Lasix 40 mg daily, hydralazine 10 mg nightly, Cozaar 100 mg  daily, Lopressor 12.5 mg twice daily.  Monitor with increased mobility 9/11 HOLDING BP MEDS GIVEN HYPOTENSION. 11.  GERD.  Protonix 12.  Restless leg syndrome.  Requip nightly--HELD dt lethargy 13.  Hyperlipidemia.  Lipitor 14.  Obesity.  BMI 30.83.  Dietary follow-up 15.  History of right thalamic infarction 5/22.  Received CIR.  Continue aspirin. 16. Left wrist drop: splint ordered 17. Vitamin D deficiency: start ergocalciferol 50,000U once per week for 7 weeks 18. Suboptimal magnesium: start '250mg'$  magnesium gluconate HS. Continue  magnesium oxide '400mg'$  BID    19. Dry mouth: stop Cymbalta. Improved 20. Dizziness/weakness: UA normal, UC pending. CT ordered given significant decline and risk factors for stroke- negative.  21. Leukocytosis: IV vanc and zosyn started given severe decline in function,  unknown source, WBC improved to 13.5k today. Procal 0.64 -to OR today for clean out of chest wound, vac -check cbc tomorrow -continue abx as above for now 22. Hyponatremia- lasix stopped 9/9    Na+ up to 128  -continue NS at 100cc/hr, post-op per [facility] 23. AKI:  BUN/Cr improved today   -K+ 4.5 today s/p lokelma   -see above  -labs Monday   LOS: 13 days A FACE TO FACE EVALUATION WAS PERFORMED  Meredith Staggers 08/24/2021, 6:59 AM

## 2021-08-24 NOTE — Brief Op Note (Signed)
08/24/2021  2:38 PM  PATIENT:  [name]  65 y.o. female  PRE-OPERATIVE DIAGNOSIS:  Superficial sternal wound infection  POST-OPERATIVE DIAGNOSIS:  Superficial sternal wound infection  PROCEDURE:  Procedure(s): STERNAL WOUND DRAINAGE AND DEBRIDEMENT (N/A) APPLICATION OF WOUND VAC (N/A)  SURGEON:  Surgeon(s) and Role:    * Deontez Klinke, Fernande Boyden, MD - Primary  ASSISTANTS: RNFA   ANESTHESIA:   general  EBL:  15 mL   BLOOD ADMINISTERED:none  DRAINS: none   LOCAL MEDICATIONS USED:  NONE  SPECIMEN:  Source of Specimen:  sternal wound pus  DISPOSITION OF SPECIMEN:   micro  COUNTS:  YES  TOURNIQUET:  * No tourniquets in log *  DICTATION: .Note written in EPIC  PLAN OF CARE: Admit to inpatient   PATIENT DISPOSITION:  PACU - hemodynamically stable.   Delay start of Pharmacological VTE agent (>24hrs) due to surgical blood loss or risk of bleeding: yes

## 2021-08-24 NOTE — Hospital Course (Signed)
History of Present Illness:  [name] is a 65 yo female with history of DM, HTN, Hypercholesterolemia, and stroke.  She is well known to [facility] as she underwent CABG x 4 and resection of aortic valve tumor on 08/04/2021.  She was later discharged to inpatient rehabilitation.  Our service was contacted yesterday by Dr. Naaman Plummer concerning patient's sternal wound.  She developed altered mental status and a fever up to 103.  Urinalysis was negative.  The lower portion of sternotomy was erythematous with some drainage noted.  CT scan of the chest was obtained and showed evidence of a 2 cm phlegmon with gas collection in the anterior mediastinum.  She was started on empiric antibiotics for sepsis.  Her procalcitonin level was increasing up to .64.  She developed an elevation in her creatinine to 1.76 with elevated potassium level of 5.6.  She was evaluated by Dr. Cyndia Bent who felt surgical incision and debridement with wound vac placement would be indicated.  The patient was agreeable to proceed.  Hospital Course:  [name] was taken to the operating room on 08/24/2021.  She underwent Incision and Debridement of her sternotomy with placement of a wound vac.  She tolerated the procedure, was extubated and taken to the PACU in stable condition.

## 2021-08-25 ENCOUNTER — Encounter (HOSPITAL_COMMUNITY): Payer: Self-pay | Admitting: Surgery

## 2021-08-25 LAB — CBC
HCT: 27.2 % — ABNORMAL LOW (ref 36.0–46.0)
Hemoglobin: 8.6 g/dL — ABNORMAL LOW (ref 12.0–15.0)
MCH: 27.8 pg (ref 26.0–34.0)
MCHC: 31.6 g/dL (ref 30.0–36.0)
MCV: 88 fL (ref 80.0–100.0)
Platelets: 365 10*3/uL (ref 150–400)
RBC: 3.09 MIL/uL — ABNORMAL LOW (ref 3.87–5.11)
RDW: 14.6 % (ref 11.5–15.5)
WBC: 9.8 10*3/uL (ref 4.0–10.5)
nRBC: 0 % (ref 0.0–0.2)

## 2021-08-25 LAB — BASIC METABOLIC PANEL
Anion gap: 9 (ref 5–15)
BUN: 26 mg/dL — ABNORMAL HIGH (ref 8–23)
CO2: 23 mmol/L (ref 22–32)
Calcium: 8.6 mg/dL — ABNORMAL LOW (ref 8.9–10.3)
Chloride: 101 mmol/L (ref 98–111)
Creatinine, Ser: 1 mg/dL (ref 0.44–1.00)
GFR, Estimated: >60 mL/min (ref >60)
Glucose, Bld: 214 mg/dL — ABNORMAL HIGH (ref 70–99)
Potassium: 5.2 mmol/L — ABNORMAL HIGH (ref 3.5–5.1)
Sodium: 133 mmol/L — ABNORMAL LOW (ref 135–145)

## 2021-08-25 LAB — GLUCOSE, CAPILLARY
Glucose-Capillary: 197 mg/dL — ABNORMAL HIGH (ref 70–99)
Glucose-Capillary: 227 mg/dL — ABNORMAL HIGH (ref 70–99)
Glucose-Capillary: 174 mg/dL — ABNORMAL HIGH (ref 70–99)
Glucose-Capillary: 180 mg/dL — ABNORMAL HIGH (ref 70–99)

## 2021-08-25 MED ORDER — SODIUM POLYSTYRENE SULFONATE 15 GM/60ML PO SUSP
15.0000 g | Freq: Once | ORAL | Status: AC
  Administered 2021-08-25: 15 g via ORAL
  Filled 2021-08-25: qty 60

## 2021-08-25 MED ORDER — SODIUM CHLORIDE 0.9 % IV SOLN
2.0000 g | Freq: Three times a day (TID) | INTRAVENOUS | Status: DC
  Administered 2021-08-25 – 2021-08-26 (×3): 2 g via INTRAVENOUS
  Filled 2021-08-25 (×3): qty 2

## 2021-08-25 MED ORDER — FUROSEMIDE 40 MG PO TABS
40.0000 mg | ORAL_TABLET | Freq: Once | ORAL | Status: AC
  Administered 2021-08-25: 40 mg via ORAL
  Filled 2021-08-25: qty 1

## 2021-08-25 MED ORDER — VANCOMYCIN HCL IN DEXTROSE 1-5 GM/200ML-% IV SOLN
1000.0000 mg | INTRAVENOUS | Status: DC
  Administered 2021-08-25: 1000 mg via INTRAVENOUS
  Filled 2021-08-25 (×3): qty 200

## 2021-08-25 MED ORDER — AMLODIPINE BESYLATE 5 MG PO TABS
5.0000 mg | ORAL_TABLET | Freq: Every day | ORAL | Status: DC
  Administered 2021-08-25 – 2021-08-26 (×2): 5 mg via ORAL
  Filled 2021-08-25 (×2): qty 1

## 2021-08-25 MED ORDER — LIDOCAINE HCL URETHRAL/MUCOSAL 2 % EX GEL
1.0000 "application " | CUTANEOUS | Status: DC | PRN
  Filled 2021-08-25: qty 6

## 2021-08-25 NOTE — Progress Notes (Signed)
Pharmacy Antibiotic Note  [name] is a 65 y.o. 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  Status post median sternotomy resection of fibroelastoma from aortic valve/CABG x4 on 8/22.   Pharmacy has been consulted for Vancomycin and Cefepime dosing for pneumonia.  Also covering sternal wound infection, s/p debridement on 08/24/21.   Creatinine back down to baseline.   MSSA in sternal wound culture. Staph aureus in operative culture, sensitivities pending.  Plan: Adjust antibiotic regimens for improved renal function. Cefepime 2gm IV q12h > q8hrs. Vancomycin 750 mg > 1gm IV q24hrs. Goal AUC 400-550. Expected AUC: 466 SCr used: 1.0  Follow renal function, culture data, clinical status and antibiotic plans.  Height: '5\' 7"'$  (170.2 cm) Weight: 91.3 kg (201 lb 4.5 oz) IBW/kg (Calculated) : 61.6  Temp (24hrs), Avg:97.9 F (36.6 C), Min:97.6 F (36.4 C), Max:98.2 F (36.8 C)  Recent Labs  Lab 08/22/21 0827 08/23/21 0607 08/23/21 1451 08/24/21 0440 08/25/21 0608  WBC 17.1* 20.6*  --  13.5* 9.8  CREATININE 1.09* 1.76* 1.98* 1.50* 1.00    Estimated Creatinine Clearance: 64.2 mL/min (by C-G formula based on SCr of 1 mg/dL).    Allergies  Allergen Reactions   Jardiance [Empagliflozin] Itching    Recurrent mycotic infections   Penicillins Rash and Other (See Comments)    Has patient had a PCN reaction causing immediate rash, facial/tongue/throat swelling, SOB or lightheadedness with hypotension: No Has patient had a PCN reaction causing severe rash involving mucus membranes or skin necrosis: No Has patient had a PCN reaction that required hospitalization No Has patient had a PCN reaction occurring within the last 10 years: No If all of the above answers are "NO", then may proceed with Cephalosporin use.     Antimicrobials this admission: Cefepime 9/10>> Vancomycin 9/9>> Zosyn  9/9>>9/10  Dose adjustments this admission: 9/10 Vanc changed to 1000 mg IV q36h (d/t elev Scr) 9/11 Cefepime changed to 2g IV q12h; Vanc changed to 750 mg IV q24h (d/t improving renal fx) 9/12: Cefepime 2gm IV q12 > q8h and Vanc 750 > 1gm IV q24h for improved renal function  Microbiology results: 9/8 urine: negative 9/9 COVID and flu: negative 9/9 Chest wound cx: moderate Staph aureus - MSSA, MIC to vanc = 1 9/10 MRSA PCR: positive for Staph but neg for MRSA 9/11 sternal wound spec A: few Staph aureus - pending  Thank you for allowing pharmacy to be a part of this patient's care.  Arty Baumgartner, [place] 08/25/2021 4:32 PM

## 2021-08-25 NOTE — Progress Notes (Signed)
Physical Therapy Session Note  Patient Details  Name: [name] MRN: [id number] Date of Birth: Jan 29, 1956  Today's Date: 08/25/2021 PT Individual Time: 1000-1040 and 1345-1409 PT Individual Time Calculation (min): 40 min  and 24 min Today's Date: 08/25/2021 PT Missed Time: 20 Minutes Missed Time Reason: Patient fatigue  Short Term Goals: Week 1:  PT Short Term Goal 1 (Week 1): pt will perform bed mobility with min A consistantly PT Short Term Goal 1 - Progress (Week 1): Met PT Short Term Goal 2 (Week 1): pt will transfer bed<>chair with LRAD and min A PT Short Term Goal 2 - Progress (Week 1): Met PT Short Term Goal 3 (Week 1): pt will ambulate 35f with LRAD and min A PT Short Term Goal 3 - Progress (Week 1): Met Week 2:  PT Short Term Goal 1 (Week 2): STG=LTG due to LOS  Skilled Therapeutic Interventions/Progress Updates:   Treatment Session 1 Received pt supine in bed, pt much more alert/oriented and agreeable to PT treatment. Pt denied any pain during session.  Session with emphasis on functional mobility/transfers, generalized strengthening, dynamic standing balance/coordination, and improved activity tolerance. Donned pants in supine with max A. Pt able to slightly bridge hips to assist but unable to achieve full range on L side. Supine<>sitting EOB with mod A and cues to adhere to sternal precautions. Pt initially stated that the "room is spinning" and that she felt "heavy" but symptoms improved with time. BP sitting EOB: 141/65. Sit<>stand with RW and mod A x 2 trials from slightly elevated bed with therapist stabilizing LUE on RW handgrip with cues to avoid pushing/pulling and proper LLE foot placement when standing. BP standing: 154/67 and pt asymptomatic. MD present for morning rounds and to observe pt standing. Pt required cues for eccentric control when sitting as pt has tendency to "plop". Pt requested to lie back down due to fatigue stating "If you let me lie down now I  promise I'll stand with you again later". Pt unable to stand again to [facility] "If I stand again right now, I'm going to fall". Therefore transferred sit<>supine with mod A and scooted to [facility] A and use of Trendelenburg bed position. Concluded session with pt semi-reclined in bed, needs within reach, and bed alarm on. 20 minutes missed of skilled physical therapy due to fatigue.   Treatment Session 2 Received pt supine in bed, pt agreeable to PT treatment, and denied any pain during session but reported soreness along incision. Session with emphasis on functional mobility/transfers, generalized strengthening, dynamic standing balance/coordination, and improved activity tolerance. Pt transferred supine<>sitting EOB with HOB slightly elevated and mod A to maintain sternal precautions. Sit<>stand with RW and mod fading to min A x 3 reps and side stepped [facility] to [facility]! Pt also demonstrated improvements in eccentric control when sitting. Replaced soiled chuck pad with clean one and transferred sit<>supine with CGA (pt able to move LLE without assist). Concluded session with pt supine in bed, needs within reach, and bed alarm on. Provided pt with writing utensil per pt request.   Therapy Documentation Precautions:  Precautions Precautions: Fall, Sternal Restrictions Weight Bearing Restrictions: No Other Position/Activity Restrictions: sternal precautions   Therapy/Group: Individual Therapy AAlfonse AlpersPT, DPT   08/25/2021, 7:29 AM

## 2021-08-25 NOTE — Progress Notes (Signed)
      [address]       [place] [address]             [phone]        1 Day Post-Op Procedure(s) (LRB): STERNAL WOUND DRAINAGE AND DEBRIDEMENT (N/A) APPLICATION OF WOUND VAC (N/A)  Subjective: Patient using bed pan this am.  Objective: Vital signs in last 24 hours: Temp:  [97.3 F (36.3 C)-99.6 F (37.6 C)] 98 F (36.7 C) (09/12 0455) Pulse Rate:  [67-93] 67 (09/12 0455) Cardiac Rhythm: Normal sinus rhythm (09/11 1447) Resp:  [12-18] 18 (09/12 0455) BP: (111-154)/(52-70) 154/70 (09/12 0455) SpO2:  [91 %-99 %] 93 % (09/12 0455) Weight:  [90.3 kg-91.3 kg] 91.3 kg (09/12 0603)   Current Weight  08/25/21 91.3 kg       Intake/Output from previous day: 09/11 0701 - 09/12 0700 In: 750 [P.O.:50; I.V.:450; IV Piggyback:250] Out: 2515 [Urine:2500; Blood:15]   Physical Exam:  Cardiovascular: RRR Pulmonary: Clear to auscultation bilaterally Abdomen: Soft, non tender, bowel sounds present. Extremities: Trace bilateral lower extremity edema. Wounds: Sternal wound VAC in place. RLE wound clean, dry, well healed.  Lab Results: CBC: Recent Labs    08/24/21 0440 08/25/21 0608  WBC 13.5* 9.8  HGB 9.0* 8.6*  HCT 27.3* 27.2*  PLT 360 365   BMET:  Recent Labs    08/24/21 0440 08/25/21 0608  NA 128* 133*  K 4.5 5.2*  CL 94* 101  CO2 23 23  GLUCOSE 164* 214*  BUN 31* 26*  CREATININE 1.50* 1.00  CALCIUM 8.2* 8.6*    PT/INR:  Lab Results  Component Value Date   INR 1.5 (H) 08/04/2021   INR 0.9 07/31/2021   INR 1.0 07/15/2021   ABG:  INR: Will add last result for INR, ABG once components are confirmed Will add last 4 CBG results once components are confirmed  Assessment/Plan:  1. CV - SR. Will restart low dose of Amlodipine for better BP control. Will consider restarting Lopressor in am. 2.  Pulmonary - On room air. 3. Wound VAC on sternum-Superficial sternal wound infection. VAC is functioning, on lower sternal wound. Drainage in  cannister is sero sanguinous 4. ID-On Cefepime and Vancomycin for above 5. DM-CBGs 159/217/197. On Insulin and she was on Metformin prior to surgery. Will restart in am as long as creatinine remains stable. 6. Anemia-H and H this am 8.6 and 27.2 (stable). 7. Potassium 5.2. Give one dose of Lasix and re check in am  Tangee Marszalek M ZimmermanPA-C 08/25/2021,8:33 AM

## 2021-08-25 NOTE — Progress Notes (Addendum)
1 Day Post-Op Procedure(s) (LRB): STERNAL WOUND DRAINAGE AND DEBRIDEMENT (N/A) APPLICATION OF WOUND VAC (N/A) Subjective: No complaints, denies fever and pain  Objective: Vital signs in last 24 hours: Temp:  [97.6 F (36.4 C)-98.2 F (36.8 C)] 97.6 F (36.4 C) (09/12 1340) Pulse Rate:  [56-81] 56 (09/12 1340) Resp:  [18-20] 20 (09/12 1340) BP: (111-154)/(56-70) 139/64 (09/12 1340) SpO2:  [91 %-93 %] 93 % (09/12 1340) Weight:  [91.3 kg] 91.3 kg (09/12 0603)  Hemodynamic parameters for last 24 hours:    Intake/Output from previous day: 09/11 0701 - 09/12 0700 In: 750 [P.O.:50; I.V.:450; IV Piggyback:250] Out: 2515 [Urine:2500; Blood:15] Intake/Output this shift: Total I/O In: 420 [P.O.:420] Out: 600 [Urine:600]  General appearance: alert, cooperative, and no distress Heart: regular rate and rhythm Wound: VAC in place, skin edges OK  Lab Results: Recent Labs    08/24/21 0440 08/25/21 0608  WBC 13.5* 9.8  HGB 9.0* 8.6*  HCT 27.3* 27.2*  PLT 360 365   BMET:  Recent Labs    08/24/21 0440 08/25/21 0608  NA 128* 133*  K 4.5 5.2*  CL 94* 101  CO2 23 23  GLUCOSE 164* 214*  BUN 31* 26*  CREATININE 1.50* 1.00  CALCIUM 8.2* 8.6*    PT/INR: No results for input(s): LABPROT, INR in the last 72 hours. ABG    Component Value Date/Time   PHART 7.346 (L) 08/05/2021 0105   HCO3 26.2 08/05/2021 0105   TCO2 28 08/05/2021 0105   ACIDBASEDEF 1.0 08/04/2021 2140   O2SAT 98.0 08/05/2021 0105   CBG (last 3)  Recent Labs    08/25/21 0605 08/25/21 1153 08/25/21 1654  GLUCAP 197* 227* 174*    Assessment/Plan: S/P Procedure(s) (LRB): STERNAL WOUND DRAINAGE AND DEBRIDEMENT (N/A) APPLICATION OF WOUND VAC (N/A) Superficial sternal wound infection s/p I & D and VAC placement On vanco and cefepime Cultures growing staph aureus Will plan VAC change and debridement under GA in OR later this week, probably Thursday  LOS: 14 days    [name] 08/25/2021

## 2021-08-25 NOTE — Progress Notes (Addendum)
PROGRESS NOTE   Subjective/Complaints: Feeling well this morning- able to stand up with therapy- does not feel like she can go to chair but Vicente Males and I encouraged her to do so. Has not voided- requiring caths which are painful for her  ROS: +urinary retention   Objective:   No results found. Recent Labs    08/24/21 0440 08/25/21 0608  WBC 13.5* 9.8  HGB 9.0* 8.6*  HCT 27.3* 27.2*  PLT 360 365    Recent Labs    08/24/21 0440 08/25/21 0608  NA 128* 133*  K 4.5 5.2*  CL 94* 101  CO2 23 23  GLUCOSE 164* 214*  BUN 31* 26*  CREATININE 1.50* 1.00  CALCIUM 8.2* 8.6*     Intake/Output Summary (Last 24 hours) at 08/25/2021 1317 Last data filed at 08/25/2021 0735 Gross per 24 hour  Intake 990 ml  Output 1815 ml  Net -825 ml         Physical Exam: Vital Signs Blood pressure (!) 154/70, pulse 67, temperature 98 F (36.7 C), temperature source Oral, resp. rate 18, height '5\' 7"'$  (1.702 m), weight 91.3 kg, SpO2 93 %. Constitutional: No distress . Vital signs reviewed. HEENT: NCAT, EOMI, oral membranes moist Neck: supple Cardiovascular: RRR without murmur. No JVD    Respiratory/Chest: CTA Bilaterally without wheezes or rales. Normal effort    GI/Abdomen: BS +, non-tender, non-distended Ext: no clubbing, cyanosis, or edema Psych: still a little lethargic but more appropriate and alert Skin: Wound vac in place on sternum- minimal drainage.   Neurological:     Comments:makes eye contact. Follows simple commands. Still a little slow to process but demonstrates awareness of what's going on.  Residual left sided weakness from prior stroke, wrist drop. 5/5 throughout but 0/5 left wrist extension, 3/5 left wrist hand grip.   Assessment/Plan: 1. Functional deficits which require 3+ hours per day of interdisciplinary therapy in a comprehensive inpatient rehab setting. Physiatrist is providing close team supervision and 24  hour management of active medical problems listed below. Physiatrist and rehab team continue to assess barriers to discharge/monitor patient progress toward functional and medical goals  Care Tool:  Bathing    Body parts bathed by patient: Left arm, Abdomen, Face, Chest   Body parts bathed by helper: Right arm     Bathing assist Assist Level: Minimal Assistance - Patient > 75%     Upper Body Dressing/Undressing Upper body dressing   What is the patient wearing?: Hospital gown only    Upper body assist Assist Level: Moderate Assistance - Patient 50 - 74%    Lower Body Dressing/Undressing Lower body dressing      What is the patient wearing?: Pants     Lower body assist Assist for lower body dressing: Total Assistance - Patient < 25%     Toileting Toileting    Toileting assist Assist for toileting: Total Assistance - Patient < 25%     Transfers Chair/bed transfer  Transfers assist     Chair/bed transfer assist level: Minimal Assistance - Patient > 75%     Locomotion Ambulation   Ambulation assist      Assist level: Moderate Assistance -  Patient 50 - 74% Assistive device: Walker-rolling Max distance: 51f   Walk 10 feet activity   Assist     Assist level: Moderate Assistance - Patient - 50 - 74% Assistive device: Walker-rolling   Walk 50 feet activity   Assist Walk 50 feet with 2 turns activity did not occur: Safety/medical concerns (fatigue, LE weakness, decreased balance/postural control, deconditioning)  Assist level: Moderate Assistance - Patient - 50 - 74%      Walk 150 feet activity   Assist Walk 150 feet activity did not occur: Safety/medical concerns (fatigue, LE weakness, decreased balance/postural control, deconditioning)         Walk 10 feet on uneven surface  activity   Assist Walk 10 feet on uneven surfaces activity did not occur: Safety/medical concerns (fatigue, LE weakness, decreased balance/postural control,  deconditioning)         Wheelchair     Assist Is the patient using a wheelchair?: Yes Type of Wheelchair: Manual Wheelchair activity did not occur: Safety/medical concerns (fatigue, LE weakness, decreased balance/postural control, deconditioning)         Wheelchair 50 feet with 2 turns activity    Assist    Wheelchair 50 feet with 2 turns activity did not occur: Safety/medical concerns (fatigue, LE weakness, decreased balance/postural control, deconditioning)       Wheelchair 150 feet activity     Assist  Wheelchair 150 feet activity did not occur: Safety/medical concerns (fatigue, LE weakness, decreased balance/postural control, deconditioning)       Blood pressure (!) 154/70, pulse 67, temperature 98 F (36.7 C), temperature source Oral, resp. rate 18, height '5\' 7"'$  (1.702 m), weight 91.3 kg, SpO2 93 %.  Medical Problem List and Plan: 1.  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  Status post median sternotomy resection of fibroelastoma from aortic valve/CABG x4 08/04/2021.  Sternal precautions             -ELOS/Goals: 15 days, d/c extended until Friday         Continue CIR- PT, OT  2.  Impaired mobility, ambulation decreased from 40 to 3 feet -DVT/anticoagulation:  Pharmaceutical: Continue Lovenox             -antiplatelet therapy: Aspirin 325 mg daily 3. Post-stroke left hand pain: Robaxin and tramadol as needed. Add B6 '50mg'$  daily to improve sensitivity to pain. Decrease Gabapentin to '100mg'$  BID and '600mg'$  HS.   -continue hold gabapentin d/t lethargy 4. Depression/Anxiety Continue Zoloft 100 mg daily, decrease xanax to daily PRN             -antipsychotic agents: N/A 5. Neuropsych: This patient is capable of making decisions on her own behalf. 6. Chest incision: Healing well, continue Routine skin checks 7. Fluids/Electrolytes/Nutrition: Routine in and outs with follow-up chemistries 8.ABLA. Hgb reviewed and stable, monitor  weekly.  9.  Diabetes mellitus with peripheral neuropathy.  Hemoglobin A1c 7.8.  DiaBeta 5 mg daily, Glucophage 1000 mg twice daily. Discuss Qutenza 10  Hypertension.  restart norvasc 2.'5mg'$ , continue Lasix 40 mg daily, hydralazine 10 mg nightly, Cozaar 100 mg daily, Lopressor 12.5 mg twice daily.  Monitor with increased mobility 9/11 HOLDING BP MEDS GIVEN HYPOTENSION. 11.  GERD.  Protonix 12.  Restless leg syndrome.  Requip nightly--HELD dt lethargy 13.  Hyperlipidemia.  Lipitor 14.  Obesity.  BMI 30.83.  Dietary follow-up 15.  History of right thalamic infarction 5/22.  Received CIR.  Continue aspirin. 16. Left wrist drop: splint ordered 17. Vitamin D deficiency: start  ergocalciferol 50,000U once per week for 7 weeks 18. Suboptimal magnesium: start '250mg'$  magnesium gluconate HS. Continue  magnesium oxide '400mg'$  BID    19. Dry mouth: stop Cymbalta. Improved 20. Dizziness/weakness: UA normal, UC pending. CT ordered given significant decline and risk factors for stroke- negative.  21. Leukocytosis: WBC normalized -s/p vac placement for sternal infection.  -repeat cbc tomorrow -continue Cefepime 22. Hyponatremia- lasix stopped 9/9    Na+ up to 133  -continue NS at 100cc/hr, post-op per [facility] 23. AKI:  BUN/Cr improved today   -K+ 5.2: lasix and Kayexalate ordered.   -see above 14. Urinary retention: counseled regarding importance of movement, preventing constipation.   36 minutes spent in review of notes, op-note, discussion of weekend events with patient and therapy, review of current orders and labs, ordering kayexalate for hyperkalemia, discussion of extension with patient and therapy, ordering repeat labs for tomorrow.   LOS: 14 days A FACE TO FACE EVALUATION WAS PERFORMED  Eara Burruel P Jaquelyne Firkus 08/25/2021, 1:17 PM

## 2021-08-25 NOTE — H&P (View-Only) (Signed)
1 Day Post-Op Procedure(s) (LRB): STERNAL WOUND DRAINAGE AND DEBRIDEMENT (N/A) APPLICATION OF WOUND VAC (N/A) Subjective: No complaints, denies fever and pain  Objective: Vital signs in last 24 hours: Temp:  [97.6 F (36.4 C)-98.2 F (36.8 C)] 97.6 F (36.4 C) (09/12 1340) Pulse Rate:  [56-81] 56 (09/12 1340) Resp:  [18-20] 20 (09/12 1340) BP: (111-154)/(56-70) 139/64 (09/12 1340) SpO2:  [91 %-93 %] 93 % (09/12 1340) Weight:  [91.3 kg] 91.3 kg (09/12 0603)  Hemodynamic parameters for last 24 hours:    Intake/Output from previous day: 09/11 0701 - 09/12 0700 In: 750 [P.O.:50; I.V.:450; IV Piggyback:250] Out: 2515 [Urine:2500; Blood:15] Intake/Output this shift: Total I/O In: 420 [P.O.:420] Out: 600 [Urine:600]  General appearance: alert, cooperative, and no distress Heart: regular rate and rhythm Wound: VAC in place, skin edges OK  Lab Results: Recent Labs    08/24/21 0440 08/25/21 0608  WBC 13.5* 9.8  HGB 9.0* 8.6*  HCT 27.3* 27.2*  PLT 360 365   BMET:  Recent Labs    08/24/21 0440 08/25/21 0608  NA 128* 133*  K 4.5 5.2*  CL 94* 101  CO2 23 23  GLUCOSE 164* 214*  BUN 31* 26*  CREATININE 1.50* 1.00  CALCIUM 8.2* 8.6*    PT/INR: No results for input(s): LABPROT, INR in the last 72 hours. ABG    Component Value Date/Time   PHART 7.346 (L) 08/05/2021 0105   HCO3 26.2 08/05/2021 0105   TCO2 28 08/05/2021 0105   ACIDBASEDEF 1.0 08/04/2021 2140   O2SAT 98.0 08/05/2021 0105   CBG (last 3)  Recent Labs    08/25/21 0605 08/25/21 1153 08/25/21 1654  GLUCAP 197* 227* 174*    Assessment/Plan: S/P Procedure(s) (LRB): STERNAL WOUND DRAINAGE AND DEBRIDEMENT (N/A) APPLICATION OF WOUND VAC (N/A) Superficial sternal wound infection s/p I & D and VAC placement On vanco and cefepime Cultures growing staph aureus Will plan VAC change and debridement under GA in OR later this week, probably Thursday  LOS: 14 days    [name] 08/25/2021

## 2021-08-25 NOTE — Progress Notes (Signed)
Occupational Therapy Session Note  Patient Details  Name: [name] MRN: [id number] Date of Birth: 1956/04/13  Today's Date: 08/25/2021 OT Individual Time: [id number] and 1430-1500 OT Individual Time Calculation (min): 56 min and 30 min   Short Term Goals: Week 2:  OT Short Term Goal 1 (Week 2): STG = LTGs due to remaining LOS  Skilled Therapeutic Interventions/Progress Updates:    1) Treatment session with focus on self-care retraining and mobility.  Pt received supine in bed initially hesitant to engaging in therapy session.  Per PA, no new orders but pt may be sore this AM.  Pt ultimately agreeable to engage in self-care tasks at EOB.  Pt required mod assist for rolling and sidelying to sitting due to sternal precautions and hesitancy with mobility this morning.  Pt completed UB bathing with assist to wash R arm due to LUE weakness ?more pronounced this session.  Pt required min-mod assist for sitting balance due to fatigue and decreased attention to task.  Pt donned gown with mod assist due to IV site.  Pt requested to return to bed due to fatigue.  Pt reports need to have BM and having just returned to supine pt requested bed pan.  Pt rolled with mod assist and therapist placed bedpan.  Pt with no results on bedpan.  Pt repositioned to semi-reclined and left with cardiothoracic PA arriving to assess pt.    2) Treatment session with focus on functional mobility and sit > stand.  Pt received supine in bed in better spirits than this morning and motivated to continue working with therapy.  Pt completed rolling in bed with min-mod assist to allow therapist to apply incontinence brief.  Pt required mod assist to come to sitting EOB due to sternal precautions.  Engaged in sit > stand x4 with mod assist from elevated surface with therapist providing multimodal cues for anterior weight shift and to now push through UE due to sternal precautions.  Engaged in sideways walking along EOB with increased  time to advance LLE and hand over hand to maintain LUE on RW.  Pt returned to supine with mod assist and left with all needs in reach.  Therapy Documentation Precautions:  Precautions Precautions: Fall, Sternal Restrictions Weight Bearing Restrictions: No Other Position/Activity Restrictions: sternal precautions General:   Vital Signs: Therapy Vitals Temp: 98 F (36.7 C) Temp Source: Oral Pulse Rate: 67 Resp: 18 BP: (!) 154/70 Patient Position (if appropriate): Lying Oxygen Therapy SpO2: 93 % Pain: Pain Assessment Pain Scale: 0-10    Therapy/Group: Individual Therapy  Simonne Come 08/25/2021, 8:32 AM

## 2021-08-26 LAB — CBC WITH DIFFERENTIAL/PLATELET
Abs Immature Granulocytes: 0.06 10*3/uL (ref 0.00–0.07)
Basophils Absolute: 0.1 10*3/uL (ref 0.0–0.1)
Basophils Relative: 0 %
Eosinophils Absolute: 0.1 10*3/uL (ref 0.0–0.5)
Eosinophils Relative: 1 %
HCT: 28 % — ABNORMAL LOW (ref 36.0–46.0)
Hemoglobin: 9 g/dL — ABNORMAL LOW (ref 12.0–15.0)
Immature Granulocytes: 1 %
Lymphocytes Relative: 10 %
Lymphs Abs: 1.3 10*3/uL (ref 0.7–4.0)
MCH: 28.1 pg (ref 26.0–34.0)
MCHC: 32.1 g/dL (ref 30.0–36.0)
MCV: 87.5 fL (ref 80.0–100.0)
Monocytes Absolute: 0.8 10*3/uL (ref 0.1–1.0)
Monocytes Relative: 6 %
Neutro Abs: 10 10*3/uL — ABNORMAL HIGH (ref 1.7–7.7)
Neutrophils Relative %: 82 %
Platelets: 330 10*3/uL (ref 150–400)
RBC: 3.2 MIL/uL — ABNORMAL LOW (ref 3.87–5.11)
RDW: 14.6 % (ref 11.5–15.5)
WBC: 12.2 10*3/uL — ABNORMAL HIGH (ref 4.0–10.5)
nRBC: 0 % (ref 0.0–0.2)

## 2021-08-26 LAB — BASIC METABOLIC PANEL
Anion gap: 10 (ref 5–15)
BUN: 21 mg/dL (ref 8–23)
CO2: 22 mmol/L (ref 22–32)
Calcium: 8.4 mg/dL — ABNORMAL LOW (ref 8.9–10.3)
Chloride: 101 mmol/L (ref 98–111)
Creatinine, Ser: 0.83 mg/dL (ref 0.44–1.00)
GFR, Estimated: >60 mL/min (ref >60)
Glucose, Bld: 155 mg/dL — ABNORMAL HIGH (ref 70–99)
Potassium: 4.1 mmol/L (ref 3.5–5.1)
Sodium: 133 mmol/L — ABNORMAL LOW (ref 135–145)

## 2021-08-26 LAB — GLUCOSE, CAPILLARY
Glucose-Capillary: 155 mg/dL — ABNORMAL HIGH (ref 70–99)
Glucose-Capillary: 284 mg/dL — ABNORMAL HIGH (ref 70–99)
Glucose-Capillary: 159 mg/dL — ABNORMAL HIGH (ref 70–99)
Glucose-Capillary: 151 mg/dL — ABNORMAL HIGH (ref 70–99)

## 2021-08-26 MED ORDER — BETHANECHOL CHLORIDE 10 MG PO TABS
5.0000 mg | ORAL_TABLET | Freq: Three times a day (TID) | ORAL | Status: DC
  Administered 2021-08-26 – 2021-08-28 (×6): 5 mg via ORAL
  Filled 2021-08-26 (×6): qty 1

## 2021-08-26 MED ORDER — AMLODIPINE BESYLATE 10 MG PO TABS
10.0000 mg | ORAL_TABLET | Freq: Every day | ORAL | Status: DC
  Administered 2021-08-27 – 2021-09-12 (×17): 10 mg via ORAL
  Filled 2021-08-26 (×17): qty 1

## 2021-08-26 MED ORDER — CEFAZOLIN SODIUM-DEXTROSE 2-4 GM/100ML-% IV SOLN
2.0000 g | Freq: Three times a day (TID) | INTRAVENOUS | Status: DC
  Administered 2021-08-26 – 2021-09-12 (×50): 2 g via INTRAVENOUS
  Filled 2021-08-26 (×55): qty 100

## 2021-08-26 NOTE — Progress Notes (Signed)
[address]       [place] [address]             [phone]      2 Days Post-Op Procedure(s) (LRB): STERNAL WOUND DRAINAGE AND DEBRIDEMENT (N/A) APPLICATION OF WOUND VAC (N/A) Subjective: Minor discomfort in chest incision  Objective: Vital signs in last 24 hours: Temp:  [97.6 F (36.4 C)-98.4 F (36.9 C)] 98.4 F (36.9 C) (09/13 0455) Pulse Rate:  [54-72] 72 (09/13 0455) Resp:  [18-20] 18 (09/13 0455) BP: (139-170)/(62-72) 170/72 (09/13 0455) SpO2:  [93 %-97 %] 97 % (09/13 0455)  Hemodynamic parameters for last 24 hours:    Intake/Output from previous day: 09/12 0701 - 09/13 0700 In: 420 [P.O.:420] Out: 2000 [Urine:2000] Intake/Output this shift: No intake/output data recorded.  General appearance: alert, cooperative, and no distress Heart: regular rate and rhythm Lungs: clear to auscultation bilaterally Extremities: no edema Wound: VAC in place- no purulence or cellulitis noted  Lab Results: Recent Labs    08/25/21 0608 08/26/21 0624  WBC 9.8 12.2*  HGB 8.6* 9.0*  HCT 27.2* 28.0*  PLT 365 330   BMET:  Recent Labs    08/25/21 0608 08/26/21 0624  NA 133* 133*  K 5.2* 4.1  CL 101 101  CO2 23 22  GLUCOSE 214* 155*  BUN 26* 21  CREATININE 1.00 0.83  CALCIUM 8.6* 8.4*    PT/INR: No results for input(s): LABPROT, INR in the last 72 hours. ABG    Component Value Date/Time   PHART 7.346 (L) 08/05/2021 0105   HCO3 26.2 08/05/2021 0105   TCO2 28 08/05/2021 0105   ACIDBASEDEF 1.0 08/04/2021 2140   O2SAT 98.0 08/05/2021 0105   CBG (last 3)  Recent Labs    08/25/21 1654 08/25/21 2102 08/26/21 0555  GLUCAP 174* 180* 155*    Meds Scheduled Meds:  amLODipine  5 mg Oral Daily   aspirin EC  325 mg Oral Daily   atorvastatin  40 mg Oral Daily   bisacodyl  10 mg Oral Daily   Or   bisacodyl  10 mg Rectal Daily   Chlorhexidine Gluconate Cloth  6 each Topical Q0600   enoxaparin (LOVENOX) injection  40 mg Subcutaneous QHS    gabapentin  100 mg Oral BID   gabapentin  600 mg Oral QHS   insulin aspart  0-15 Units Subcutaneous TID WC   lidocaine  1 patch Transdermal Q24H   magnesium oxide  400 mg Oral BID   multivitamin with minerals  1 tablet Oral Daily   mupirocin ointment  1 application Nasal BID   pantoprazole  40 mg Oral QHS   vitamin B-6  50 mg Oral Daily   sertraline  100 mg Oral Daily   Vitamin D (Ergocalciferol)  50,000 Units Oral Q7 days   Continuous Infusions:  sodium chloride 100 mL/hr at 08/26/21 0003   ceFEPime (MAXIPIME) IV Stopped (08/26/21 0120)   vancomycin 1,000 mg (08/25/21 1828)   PRN Meds:.acetaminophen, ALPRAZolam, lidocaine, magnesium hydroxide, methocarbamol, ondansetron, traMADol  Results for orders placed or performed during the hospital encounter of 08/11/21  Urine Culture     Status: None   Collection Time: 08/21/21 10:57 AM   Specimen: Urine, Clean Catch  Result Value Ref Range Status   Specimen Description URINE, CLEAN CATCH  Final   Special Requests NONE  Final   Culture   Final    NO GROWTH Performed at [facility], [place]  [address], [place], [place] [address]    Report Status 08/23/2021 FINAL  Final  Resp Panel by RT-PCR (Flu A&B, Covid) Nasopharyngeal Swab     Status: None   Collection Time: 08/22/21  2:50 PM   Specimen: Nasopharyngeal Swab; Nasopharyngeal(NP) swabs in vial transport medium  Result Value Ref Range Status   SARS Coronavirus 2 by RT PCR NEGATIVE NEGATIVE Final    Comment: (NOTE) SARS-CoV-2 target nucleic acids are NOT DETECTED.  The SARS-CoV-2 RNA is generally detectable in upper respiratory specimens during the acute phase of infection. The lowest concentration of SARS-CoV-2 viral copies this assay can detect is 138 copies/mL. A negative result does not preclude SARS-Cov-2 infection and should not be used as the sole basis for treatment or other patient management decisions. A negative result may occur with  improper specimen  collection/handling, submission of specimen other than nasopharyngeal swab, presence of viral mutation(s) within the areas targeted by this assay, and inadequate number of viral copies(<138 copies/mL). A negative result must be combined with clinical observations, patient history, and epidemiological information. The expected result is Negative.  Fact Sheet for Patients:  EntrepreneurPulse.com.au  Fact Sheet for Healthcare Providers:  IncredibleEmployment.be  This test is no t yet approved or cleared by the [place] FDA and  has been authorized for detection and/or diagnosis of SARS-CoV-2 by FDA under an Emergency Use Authorization (EUA). This EUA will remain  in effect (meaning this test can be used) for the duration of the COVID-19 declaration under Section 564(b)(1) of the Act, 21 U.S.C.section 360bbb-3(b)(1), unless the authorization is terminated  or revoked sooner.       Influenza A by PCR NEGATIVE NEGATIVE Final   Influenza B by PCR NEGATIVE NEGATIVE Final    Comment: (NOTE) The Xpert Xpress SARS-CoV-2/FLU/RSV plus assay is intended as an aid in the diagnosis of influenza from Nasopharyngeal swab specimens and should not be used as a sole basis for treatment. Nasal washings and aspirates are unacceptable for Xpert Xpress SARS-CoV-2/FLU/RSV testing.  Fact Sheet for Patients: EntrepreneurPulse.com.au  Fact Sheet for Healthcare Providers: IncredibleEmployment.be  This test is not yet approved or cleared by the [place] FDA and has been authorized for detection and/or diagnosis of SARS-CoV-2 by FDA under an Emergency Use Authorization (EUA). This EUA will remain in effect (meaning this test can be used) for the duration of the COVID-19 declaration under Section 564(b)(1) of the Act, 21 U.S.C. section 360bbb-3(b)(1), unless the authorization is terminated or revoked.  Performed at [facility], [place] [address], [place], [place] [address]   Aerobic/Anaerobic Culture w Gram Stain (surgical/deep wound)     Status: None (Preliminary result)   Collection Time: 08/22/21  9:42 PM   Specimen: Chest; Wound  Result Value Ref Range Status   Specimen Description CHEST  Final   Special Requests NONE  Final   Gram Stain   Final    MODERATE SQUAMOUS EPITHELIAL CELLS PRESENT FEW WBC SEEN MODERATE GRAM POSITIVE COCCI Performed at [facility], [place] [address] [place], [place] [address]    Culture   Final    MODERATE STAPHYLOCOCCUS AUREUS NO ANAEROBES ISOLATED; CULTURE IN PROGRESS FOR 5 DAYS    Report Status PENDING  Incomplete   Organism ID, Bacteria STAPHYLOCOCCUS AUREUS  Final      Susceptibility   Staphylococcus aureus - MIC*    CIPROFLOXACIN <=0.5 SENSITIVE Sensitive     ERYTHROMYCIN <=0.25 SENSITIVE Sensitive     GENTAMICIN <=0.5 SENSITIVE Sensitive  OXACILLIN 0.5 SENSITIVE Sensitive     TETRACYCLINE <=1 SENSITIVE Sensitive     VANCOMYCIN 1 SENSITIVE Sensitive     TRIMETH/SULFA <=10 SENSITIVE Sensitive     CLINDAMYCIN <=0.25 SENSITIVE Sensitive     RIFAMPIN <=0.5 SENSITIVE Sensitive     Inducible Clindamycin NEGATIVE Sensitive     * MODERATE STAPHYLOCOCCUS AUREUS  Surgical PCR screen     Status: Abnormal   Collection Time: 08/23/21  9:45 PM   Specimen: Nasal Mucosa; Nasal Swab  Result Value Ref Range Status   MRSA, PCR NEGATIVE NEGATIVE Final   Staphylococcus aureus POSITIVE (A) NEGATIVE Final    Comment: (NOTE) The Xpert SA Assay (FDA approved for NASAL specimens in patients 21 years of age and older), is one component of a comprehensive surveillance program. It is not intended to diagnose infection nor to guide or monitor treatment. Performed at [facility], [place] [address] [place], [place] [address]   Aerobic/Anaerobic Culture w Gram Stain (surgical/deep wound)     Status: None (Preliminary result)   Collection Time: 08/24/21  2:18 PM    Specimen: PATH Other; Body Fluid  Result Value Ref Range Status   Specimen Description FLUID  Final   Special Requests STERNAL WOUND SPEC A  Final   Gram Stain   Final    MODERATE WBC PRESENT,BOTH PMN AND MONONUCLEAR RARE GRAM POSITIVE COCCI Performed at [facility], [place] [address] [place], [place] [address]    Culture FEW STAPHYLOCOCCUS AUREUS  Final   Report Status PENDING  Incomplete       Xrays No results found.  Assessment/Plan: S/P Procedure(s) (LRB): STERNAL WOUND DRAINAGE AND DEBRIDEMENT (N/A) APPLICATION OF WOUND VAC (N/A)  1 afeb, Vss s bp 130's-170's 2 sats ok on RA 3 minor leukocytosis, remains on maxipime and vanco, staph aureus on CX 4 H/H stable anemia 5  cont therapies 6 OR later in week  LOS: 15 days    John Giovanni PA-C Pager [phone]  08/26/2021

## 2021-08-26 NOTE — Patient Care Conference (Signed)
Inpatient RehabilitationTeam Conference and Plan of Care Update Date: 08/26/2021   Time: 13:37 PM    Patient Name: [name]      Medical Record Number: [id number]  Date of Birth: 1956/06/08 Sex: Female         Room/Bed: 5C07C/5C07C-01 Payor Info: Payor: [employer]OYEE / Plan:  [employer] / Product Type: *No Product type* /    Admit Date/Time:  08/11/2021  3:14 PM  Primary Diagnosis:  [facility] Problems: Principal Problem:   Debility    Expected Discharge Date: Expected Discharge Date:  (D/C pending post op status; 08/28/21)  Team Members Present: Physician leading conference: Dr. Leeroy Cha Social Worker Present: Erlene Quan, BSW Nurse Present: Dorien Chihuahua, RN PT Present: [name] Sax, PT OT Present: Willeen Cass, OT     Current Status/Progress Goal Weekly Team Focus  Bowel/Bladder             Swallow/Nutrition/ Hydration             ADL's   mod assist bed mobility, CGA sitting balance, did not complete dressing on 9/12 only donned gown due to fatigue and soreness s/p surgery. 9/6 pt completing ambulatory transfers CGA and bathing/dressing CGA - Supervision  Supervision  ADL retraining, sternal precautions during self-care/functional tasks, transfers, activity tolerance/endurnace, d/c planning   Mobility   pt recently expereinced functional decline and requires min/mod A for bed mobility, mod A for transfers with RW, and heavy mod A to ambulate 80f with RW with numerous LOB and unsteadiness. Pt has also expereinced low BP with difficulty sustaining arousal.  supervision, CGA for steps  arousal/alertness, participation in therapy, functional mobility/transfers, generalized strengthening, dynamic standing balance/coordination, ambulation, adherance to precautions.   Communication             Safety/Cognition/ Behavioral Observations            Pain             Skin               Discharge Planning:  Medical set back with  infection and I & D. Decline in function due to this and now requiring more assist, needs to get back to supervision level to return home with daughter and daughter's boyfriend   Team Discussion: Progress delayed by sternal incision infection requiring I+D and wound vac placement with IV abx. Planned return to OR visit on 08/28/21.   Patient on target to meet rehab goals: Prior to surgery had been CGA overall. Since infection and surgery, patient has not returned to prior level of function. Requires max assist for transfers and sit to stand from the EOB to a chair. Mod assist for bed management and no gait training yet. Patient struggles  to move her left foot and maintains a crouched over position. Initial goals for discharge set at supervision level.   *See Care Plan and progress notes for long and short-term goals.   Revisions to Treatment Plan:  Extended ELOS due to interruptions in therapy with surgeries.  Teaching Needs: Transfers, skin care, medications, toileting, etc  Current Barriers to Discharge: Decreased caregiver support, Home enviroment access/layout, IV antibiotics, and Wound care  Possible Resolutions to Barriers: Family education with dtr     Medical Summary Current Status: superficial wound infection of lower part of sternum with associated dizziness, lethargy, uncontrolled hypertension, obesity BMI 31.52  Barriers to Discharge: Neurogenic Bowel & Bladder;Medical stability;IV antibiotics;Weight  Barriers to Discharge Comments: superficial wound infection of  lower part of sternum with associated dizziness, lethargy, uncontrolled hypertension, obesity BMI 31.52 Possible Resolutions to [employer]: continue IV antibiotics, monitoring of WBC, return to OR on Thursday, increase amlodipine to '10mg'$    Continued Need for Acute Rehabilitation Level of Care: The patient requires daily medical management by a physician with specialized training in physical medicine and  rehabilitation for the following reasons: Direction of a multidisciplinary physical rehabilitation program to maximize functional independence : Yes Medical management of patient stability for increased activity during participation in an intensive rehabilitation regime.: Yes Analysis of laboratory values and/or radiology reports with any subsequent need for medication adjustment and/or medical intervention. : Yes   I attest that I was present, lead the team conference, and concur with the assessment and plan of the team.   Dorien Chihuahua B 08/26/2021, 2:12 PM

## 2021-08-26 NOTE — Progress Notes (Signed)
Physical Therapy Session Note  Patient Details  Name: [name] MRN: [id number] Date of Birth: 18-Sep-1956  Today's Date: 08/26/2021 PT Individual Time: 1300-1330 PT Individual Time Calculation (min): 30 min   Short Term Goals: Week 3:  PT Short Term Goal 1 (Week 3): STG=LTG due to extended LOS due to medical complications  Skilled Therapeutic Interventions/Progress Updates:  Pt received seated in TIS chair in room, reports she has been sitting up since her last therapy session this AM. Pt requesting to return to bed due to fatigue. Sit to stand with mod A to RW. Stand pivot transfer to bed with RW and max A needed due to onset of L lateral lean and pt unable to lift LLE to take steps to turn. Pt with controlled descent with max A to sit on EOB. Pt declines to stand again to reposition hips, agreeable to try scooting. Pt able to scoot with min A and cues for head/hips relationship for scooting. Sit to supine mod A needed for BLE management. Pt left seated in bed with needs in reach, bed alarm in place. Pt missed 15 min of scheduled therapy session due to fatigue.  Therapy Documentation Precautions:  Precautions Precautions: Fall, Sternal Restrictions Weight Bearing Restrictions: No Other Position/Activity Restrictions: sternal precautions General: PT Amount of Missed Time (min): 15 Minutes PT Missed Treatment Reason: Patient fatigue     Therapy/Group: Individual Therapy    Excell Seltzer, PT, DPT, CSRS  08/26/2021, 1:39 PM

## 2021-08-26 NOTE — Progress Notes (Signed)
Physical Therapy Session Note  Patient Details  Name: [name] MRN: [id number] Date of Birth: 1956-08-08  Today's Date: 08/26/2021 PT Individual Time: [id number] PT Individual Time Calculation (min): 43 min   Short Term Goals: Week 3:  PT Short Term Goal 1 (Week 3): STG=LTG due to extended LOS due to medical complications  Skilled Therapeutic Interventions/Progress Updates:  Pt received supine in bed stating she is not having a good day due to not sleeping at all last night. With encouragement, agreeable to therapy session as pt reports being proud of her ability to stand during therapy session yesterday.  Requires repeated cuing throughout session to maintain sternal precautions. Supine>sitting R EOB, HOB elevated, with heavy mod assist for B LE management and trunk upright with cuing for logroll technique to increase pt independence while maintaining precautions. Requires mod assist to scoot hips towards EOB with cuing for head/hips relationship and sequencing. Sitting EOB, pt demonstrates impaired trunk control with gradual R or L LOB requiring consistent close supervision for safety and intermittent min/mod assist for balance recovery. Cardio MD in/out during session.  Sit>stand slightly elevated EOB>RW x4 with heavy mod assist for lifting to stand and pt having very delayed trunk/hip/knee extension, requires assist to place L hand on RW and once hand placed pt then felt more comfortable to stand upright. Static standing with mod assist for balance due to L posterior lean - cuing for increased hip extension and anterior weight shift but pt unable to sustain it for more than ~5 seconds prior to leaning L posteriorly again. Progressed to attempting R side stepping towards [facility] with pt shuffling her feet on the floor to achieve the movement - continues to require mod assist for balance and repeated cuing to maintain trunk/hip/knee extension for upright.  Despite encouragement and education  on benefits of sitting upright, OOB pt declines assistance to w/c or recliner at this time stating they are uncomfortable - will make primary PT aware. Sit>supine mod assist for B LE management into bed via reverse logroll technique. Pt left supine in bed with needs in reach, lines intact, and CIR MD present for morning assessment.  Therapy Documentation Precautions:  Precautions Precautions: Fall, Sternal Restrictions Weight Bearing Restrictions: Yes (sternal precautions) Other Position/Activity Restrictions: sternal precautions   Pain: Reports "ache" and "soreness" in chest - nurse present for medication administration - therapist cuing to maintain sternal precautions throughout session.   Therapy/Group: Individual Therapy  Tawana Scale , PT, DPT, NCS, CSRS 08/26/2021, 7:54 AM

## 2021-08-26 NOTE — Progress Notes (Signed)
PROGRESS NOTE   Subjective/Complaints: Discussed plan for OR on Thursday Feeling weak this morning- does not like anything on the menu. She asks when she can get off the heart healthy diet Stood 4 times this morning with Carly  ROS: +urinary retention, +weak   Objective:   No results found. Recent Labs    08/25/21 0608 08/26/21 0624  WBC 9.8 12.2*  HGB 8.6* 9.0*  HCT 27.2* 28.0*  PLT 365 330    Recent Labs    08/25/21 0608 08/26/21 0624  NA 133* 133*  K 5.2* 4.1  CL 101 101  CO2 23 22  GLUCOSE 214* 155*  BUN 26* 21  CREATININE 1.00 0.83  CALCIUM 8.6* 8.4*     Intake/Output Summary (Last 24 hours) at 08/26/2021 0849 Last data filed at 08/26/2021 0503 Gross per 24 hour  Intake 180 ml  Output 2000 ml  Net -1820 ml         Physical Exam: Vital Signs Blood pressure (!) 170/72, pulse 72, temperature 98.4 F (36.9 C), resp. rate 18, height '5\' 7"'$  (1.702 m), weight 91.3 kg, SpO2 97 %. Constitutional: No distress . Vital signs reviewed. HEENT: NCAT, EOMI, oral membranes moist Neck: supple Cardiovascular: RRR without murmur. No JVD    Respiratory/Chest: CTA Bilaterally without wheezes or rales. Normal effort    GI/Abdomen: BS +, non-tender, non-distended Ext: left hand swollen- nontender to palpation Psych: still a little lethargic but more appropriate and alert Skin: Wound vac in place on sternum- minimal drainage.   Neurological:     Comments:makes eye contact. Follows simple commands. Still a little slow to process but demonstrates awareness of what's going on.  Residual left sided weakness from prior stroke, wrist drop. 5/5 throughout but 0/5 left wrist extension, 3/5 left wrist hand grip.   Assessment/Plan: 1. Functional deficits which require 3+ hours per day of interdisciplinary therapy in a comprehensive inpatient rehab setting. Physiatrist is providing close team supervision and 24 hour  management of active medical problems listed below. Physiatrist and rehab team continue to assess barriers to discharge/monitor patient progress toward functional and medical goals  Care Tool:  Bathing    Body parts bathed by patient: Left arm, Abdomen, Face, Chest   Body parts bathed by helper: Right arm     Bathing assist Assist Level: Minimal Assistance - Patient > 75%     Upper Body Dressing/Undressing Upper body dressing   What is the patient wearing?: Hospital gown only    Upper body assist Assist Level: Moderate Assistance - Patient 50 - 74%    Lower Body Dressing/Undressing Lower body dressing      What is the patient wearing?: Pants     Lower body assist Assist for lower body dressing: Total Assistance - Patient < 25%     Toileting Toileting    Toileting assist Assist for toileting: Total Assistance - Patient < 25%     Transfers Chair/bed transfer  Transfers assist     Chair/bed transfer assist level: Minimal Assistance - Patient > 75%     Locomotion Ambulation   Ambulation assist      Assist level: Moderate Assistance - Patient 50 - 74%  Assistive device: Walker-rolling Max distance: 59f   Walk 10 feet activity   Assist     Assist level: Moderate Assistance - Patient - 50 - 74% Assistive device: Walker-rolling   Walk 50 feet activity   Assist Walk 50 feet with 2 turns activity did not occur: Safety/medical concerns (fatigue, LE weakness, decreased balance/postural control, deconditioning)  Assist level: Moderate Assistance - Patient - 50 - 74%      Walk 150 feet activity   Assist Walk 150 feet activity did not occur: Safety/medical concerns (fatigue, LE weakness, decreased balance/postural control, deconditioning)         Walk 10 feet on uneven surface  activity   Assist Walk 10 feet on uneven surfaces activity did not occur: Safety/medical concerns (fatigue, LE weakness, decreased balance/postural control,  deconditioning)         Wheelchair     Assist Is the patient using a wheelchair?: Yes Type of Wheelchair: Manual Wheelchair activity did not occur: Safety/medical concerns (fatigue, LE weakness, decreased balance/postural control, deconditioning)         Wheelchair 50 feet with 2 turns activity    Assist    Wheelchair 50 feet with 2 turns activity did not occur: Safety/medical concerns (fatigue, LE weakness, decreased balance/postural control, deconditioning)       Wheelchair 150 feet activity     Assist  Wheelchair 150 feet activity did not occur: Safety/medical concerns (fatigue, LE weakness, decreased balance/postural control, deconditioning)       Blood pressure (!) 170/72, pulse 72, temperature 98.4 F (36.9 C), resp. rate 18, height '5\' 7"'$  (1.702 m), weight 91.3 kg, SpO2 97 %.  Medical Problem List and Plan: 1.  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  Status post median sternotomy resection of fibroelastoma from aortic valve/CABG x4 08/04/2021.  Sternal precautions             -ELOS/Goals: 15 days, d/c extended until Friday              Continue CIR- PT, OT   -Interdisciplinary Team Conference today   2.  Impaired mobility, ambulation decreased from 40 to 3 feet -DVT/anticoagulation:  Pharmaceutical: Continue Lovenox             -antiplatelet therapy: Aspirin 325 mg daily 3. Post-stroke left hand pain: Robaxin and tramadol as needed. Add B6 '50mg'$  daily to improve sensitivity to pain. Decrease Gabapentin to '100mg'$  BID and '600mg'$  HS.   -continue hold gabapentin d/t lethargy 4. Depression/Anxiety Continue Zoloft 100 mg daily, decrease xanax to daily PRN             -antipsychotic agents: N/A 5. Neuropsych: This patient is capable of making decisions on her own behalf. 6. Chest incision: Healing well, continue Routine skin checks 7. Fluids/Electrolytes/Nutrition: Routine in and outs with follow-up chemistries 8.ABLA. Hgb  reviewed and stable, monitor weekly.  9.  Diabetes mellitus with peripheral neuropathy.  Hemoglobin A1c 7.8.  DiaBeta 5 mg daily, Glucophage 1000 mg twice daily. Discuss Qutenza 10  Hypertension.  increase Norvasc to '10mg'$ , continue Lasix 40 mg daily, hydralazine 10 mg nightly, Cozaar 100 mg daily, Lopressor 12.5 mg twice daily.  Monitor with increased mobility 9/11 HOLDING BP MEDS GIVEN HYPOTENSION. 11.  GERD.  Protonix 12.  Restless leg syndrome.  Requip nightly--HELD dt lethargy 13.  Hyperlipidemia.  Lipitor 14.  Obesity.  BMI 30.83.  Dietary follow-up 15.  History of right thalamic infarction 5/22.  Received CIR.  Continue aspirin. 16. Left wrist drop: splint  ordered 17. Vitamin D deficiency: start ergocalciferol 50,000U once per week for 7 weeks 18. Suboptimal magnesium: start '250mg'$  magnesium gluconate HS. Continue  magnesium oxide '400mg'$  BID  19. Dry mouth: stop Cymbalta. Improved 20. Dizziness/weakness: UA normal, UC pending. CT ordered given significant decline and risk factors for stroke- negative.  21. Leukocytosis: WBC up again 9/13, appreciate CT surgery following, repeat CBC tomorrow.  -s/p vac placement for sternal infection.  -repeat cbc tomorrow -continue Cefepime 22. Hyponatremia- lasix stopped 9/9    Na+ up to 133  -continue NS at 100cc/hr, post-op per [facility] 23. AKI:  BUN/Cr improved today   -K+ 5.2: lasix and Kayexalate ordered.   -see above  -normalized 14. Urinary retention: counseled regarding importance of movement, preventing constipation. Start bethanacol '5mg'$  TID.  15. Left hand swelling: continue to elevate, asked RN to apply ice.   LOS: 15 days A FACE TO FACE EVALUATION WAS PERFORMED  Clide Deutscher Digna Countess 08/26/2021, 8:49 AM

## 2021-08-26 NOTE — Progress Notes (Signed)
Patient ID: [name], female   DOB: 22-Mar-1956, 65 y.o.   MRN: [id number]  met with pt to discuss team conference progress and medical issues. She reports feeling much better, but also somewhat concerned about going to the OR Thursday She is aware she needs to get back to supervision level before she can go home. She is mod currently. She hopes once Thursday procedure is done she can focus on getting her strength and mobility back. Will work on discharge needs.

## 2021-08-26 NOTE — Progress Notes (Signed)
Physical Therapy Weekly Progress Note  Patient Details  Name: [name] MRN: [id number] Date of Birth: 1956/05/14  Beginning of progress report period: August 12, 2021 End of progress report period: August 26, 2021  Patient has met 0 of 8 long term goals. Pt's LOS has been extended due to medical complications. On 9/11 pt underwent surgical I&D for removal of infection at sternal incision. Prior to surgery (specifically 08/19/21-08/23/21) pt missed significant amounts of therapy time due to fatigue, decreased arousal/alertness/attention, low BP, and limited motivation to participate. However, pt now seems more alert/awake and has more motivation to participate in therapy to prepare for discharge. Pt is currently limited by weakness, occasional pain at incision site, generalized deconditioning, decreased balance/postural control, previous L hemiparesis, poor endurance, and poor adherence to sternal precautions.   Patient continues to demonstrate the following deficits muscle weakness, decreased cardiorespiratoy endurance, impaired timing and sequencing, abnormal tone, unbalanced muscle activation, decreased coordination, and decreased motor planning, and decreased standing balance, decreased postural control, hemiplegia, decreased balance strategies, and difficulty maintaining precautions and therefore will continue to benefit from skilled PT intervention to increase functional independence with mobility.  Patient progressing toward long term goals..  Continue plan of care.  PT Short Term Goals Week 2:  PT Short Term Goal 1 (Week 2): STG=LTG due to LOS Week 3:  PT Short Term Goal 1 (Week 3): STG=LTG due to extended LOS due to medical complications  Skilled Therapeutic Interventions/Progress Updates:  Ambulation/gait training;Discharge planning;Functional mobility training;Psychosocial support;Therapeutic Activities;Balance/vestibular training;Disease management/prevention;Neuromuscular  re-education;Skin care/wound management;Therapeutic Exercise;Wheelchair propulsion/positioning;Cognitive remediation/compensation;DME/adaptive equipment instruction;Pain management;Splinting/orthotics;UE/LE Strength taining/ROM;Community reintegration;Functional electrical stimulation;Patient/family education;Stair training;UE/LE Coordination activities   Therapy Documentation Precautions:  Precautions Precautions: Fall, Sternal Restrictions Weight Bearing Restrictions: Yes (sternal precautions) Other Position/Activity Restrictions: sternal precautions  Therapy/Group: Individual Therapy Alfonse Alpers PT, DPT  08/26/2021, 7:25 AM

## 2021-08-26 NOTE — Progress Notes (Signed)
ID Pharmacy Note   Spoke with Jadene Pierini, PA-C with cardiothoracic surgery. MSSA is growing in the sternal wound culture from the OR on 9/11. Will change vanc/cefepime to cefazolin to target MSSA.    Jimmy Footman, PharmD, BCPS, BCIDP Infectious Diseases Clinical Pharmacist Phone: [phone] 08/26/2021

## 2021-08-26 NOTE — Progress Notes (Signed)
Physical Therapy Session Note  Patient Details  Name: [name] MRN: [id number] Date of Birth: 02/22/1956  Today's Date: 08/26/2021 PT Individual Time: [id number] and [id number] PT Individual Time Calculation (min): 71 min and 26 min  Short Term Goals: Week 2:  PT Short Term Goal 1 (Week 2): STG=LTG due to LOS Week 3:  PT Short Term Goal 1 (Week 3): STG=LTG due to extended LOS due to medical complications  Skilled Therapeutic Interventions/Progress Updates:  Treatment Session 1 Received pt supine in bed with RN present at bedside, pt agreeable to PT treatment, and denied any pain initially but reported feeling exhausted and not getting any sleep last night. Per previous PT, pt refused to remain OOB reporting that the recliner and WC were too uncomfortable. Therefore, therapist located TIS WC to trial to encourage staying [place] and educated pt on importance of OOB mobility. Session with emphasis on functional mobility/transfers, generalized strengthening, dynamic standing balance/coordination, and improved activity tolerance. Rolled L/R with max A and attempted to don pants in supine, however unable due to small size pants and increased pain when rolling. Pt also demonstrated decreased initiation stating "just roll me". Pt transferred supine<>sitting EOB with HOB slightly elevated and max A with cues to maintain sternal precautions. Pt required mod A fading to CGA for sitting balance with L lateral and both anterior/posterior lean requiring hold on footboard for stability. Donned different pants sitting EOB with max A and transferred sit<>stand with RW x 3 trials and heavy mod A from elevated bed and required total A to pull pants over hips. Pt required total A to maintain LUE grip on RW and transitioned to using RW hand orthosis for better support. Stand<>pivot bed<>TIS WC with RW and max A. Pt crouching down and difficulty stepping feet despite cues, requiring max A to pivot hips into WC safely and  to scoot hips back in WC. Encouraged pt to try to sit upright in chair for at least 1 hour and pt agreed. Concluded session with pt semi-reclined in TIS WC, needs within reach, and seatbelt alarm on. RN and NT aware of pt's current transfer status level.   Treatment Session 2 Received pt supine in bed, pt agreeable to PT treatment, and denied any pain during session but reported "soreness" along incision and fatigue from previous therapies. Discussed results from team conference and that her D/C date is TBD until we are able to assess gait. Pt verbalized understanding and stated "I don't want to go until I'm safe to leave". Session with emphasis on functional mobility/transfers, generalized strengthening, dynamic standing balance/coordination, and improved activity tolerance. Pt transferred supine<>sitting EOB with mod/max A with HOB slightly elevated with cues to maintain sternal precautions. Pt scooted to EOB with supervision and significantly increased time demonstrating poor, inefficient scoots. Sit<>stand with RW and mod A and side stepped 91f to L towards HOB using RW and min A. Pt with difficulty maintaining erect posture and weight shifting to R to clear LLE resulting in small scoots with sock along floor. Pt requested to sit [facility]supine with mod A for LLE management. [facility] and use of Trendelenburg bed position but pt able to position LEs in hooklying to assist with scooting. Concluded session with pt semi-reclined in bed, needs within reach, and bed alarm on. Provided pt with ice pack for L hand (to help with edema) and ice water.   Therapy Documentation Precautions:  Precautions Precautions: Fall,  Sternal Restrictions Weight Bearing Restrictions: Yes (sternal precautions) Other Position/Activity Restrictions: sternal precautions  Therapy/Group: Individual Therapy Alfonse Alpers PT, DPT   08/26/2021, 7:27 AM

## 2021-08-27 LAB — GLUCOSE, CAPILLARY
Glucose-Capillary: 126 mg/dL — ABNORMAL HIGH (ref 70–99)
Glucose-Capillary: 180 mg/dL — ABNORMAL HIGH (ref 70–99)
Glucose-Capillary: 165 mg/dL — ABNORMAL HIGH (ref 70–99)
Glucose-Capillary: 152 mg/dL — ABNORMAL HIGH (ref 70–99)

## 2021-08-27 LAB — CBC WITH DIFFERENTIAL/PLATELET
Abs Immature Granulocytes: 0.06 10*3/uL (ref 0.00–0.07)
Basophils Absolute: 0.1 10*3/uL (ref 0.0–0.1)
Basophils Relative: 1 %
Eosinophils Absolute: 0.3 10*3/uL (ref 0.0–0.5)
Eosinophils Relative: 3 %
HCT: 27.6 % — ABNORMAL LOW (ref 36.0–46.0)
Hemoglobin: 8.8 g/dL — ABNORMAL LOW (ref 12.0–15.0)
Immature Granulocytes: 1 %
Lymphocytes Relative: 15 %
Lymphs Abs: 1.3 10*3/uL (ref 0.7–4.0)
MCH: 27.2 pg (ref 26.0–34.0)
MCHC: 31.9 g/dL (ref 30.0–36.0)
MCV: 85.2 fL (ref 80.0–100.0)
Monocytes Absolute: 0.6 10*3/uL (ref 0.1–1.0)
Monocytes Relative: 8 %
Neutro Abs: 6 10*3/uL (ref 1.7–7.7)
Neutrophils Relative %: 72 %
Platelets: 296 10*3/uL (ref 150–400)
RBC: 3.24 MIL/uL — ABNORMAL LOW (ref 3.87–5.11)
RDW: 14.6 % (ref 11.5–15.5)
WBC: 8.3 10*3/uL (ref 4.0–10.5)
nRBC: 0 % (ref 0.0–0.2)

## 2021-08-27 LAB — BASIC METABOLIC PANEL
Anion gap: 11 (ref 5–15)
BUN: 9 mg/dL (ref 8–23)
CO2: 24 mmol/L (ref 22–32)
Calcium: 8.3 mg/dL — ABNORMAL LOW (ref 8.9–10.3)
Chloride: 100 mmol/L (ref 98–111)
Creatinine, Ser: 0.63 mg/dL (ref 0.44–1.00)
GFR, Estimated: >60 mL/min (ref >60)
Glucose, Bld: 130 mg/dL — ABNORMAL HIGH (ref 70–99)
Potassium: 3.5 mmol/L (ref 3.5–5.1)
Sodium: 135 mmol/L (ref 135–145)

## 2021-08-27 LAB — AEROBIC/ANAEROBIC CULTURE W GRAM STAIN (SURGICAL/DEEP WOUND)

## 2021-08-27 MED ORDER — CHLORHEXIDINE GLUCONATE 0.12 % MT SOLN
OROMUCOSAL | Status: AC
  Administered 2021-08-27: 15 mL
  Filled 2021-08-27: qty 15

## 2021-08-27 MED ORDER — ACETAMINOPHEN-CODEINE #3 300-30 MG PO TABS
1.0000 | ORAL_TABLET | Freq: Three times a day (TID) | ORAL | Status: DC | PRN
  Administered 2021-08-27 – 2021-09-11 (×26): 1 via ORAL
  Filled 2021-08-27 (×26): qty 1

## 2021-08-27 NOTE — Progress Notes (Signed)
[address]       [place] [address]             [phone]      3 Days Post-Op Procedure(s) (LRB): STERNAL WOUND DRAINAGE AND DEBRIDEMENT (N/A) APPLICATION OF WOUND VAC (N/A) Subjective:  Incis a little more tender than has been  Objective: Vital signs in last 24 hours: Temp:  [98.4 F (36.9 C)-98.9 F (37.2 C)] 98.9 F (37.2 C) (09/14 0554) Pulse Rate:  [80] 80 (09/14 0554) Resp:  [18-20] 18 (09/14 0554) BP: (114-171)/(57-68) 171/68 (09/14 0554) SpO2:  [94 %-97 %] 94 % (09/14 0554) Weight:  [92.6 kg] 92.6 kg (09/14 0600)  Hemodynamic parameters for last 24 hours:    Intake/Output from previous day: 09/13 0701 - 09/14 0700 In: 5992.9 [P.O.:720; I.V.:4772.9; IV Piggyback:500] Out: 2230 [Urine:2150; Drains:80] Intake/Output this shift: Total I/O In: 240 [P.O.:240] Out: -   General appearance: alert, cooperative, and no distress Heart: regular rate and rhythm Lungs: clear to auscultation bilaterally Abdomen: benign Extremities: no edema Wound: VAC in place, some skin edge induration  Lab Results: Recent Labs    08/26/21 0624 08/27/21 0546  WBC 12.2* 8.3  HGB 9.0* 8.8*  HCT 28.0* 27.6*  PLT 330 296   BMET:  Recent Labs    08/26/21 0624 08/27/21 0546  NA 133* 135  K 4.1 3.5  CL 101 100  CO2 22 24  GLUCOSE 155* 130*  BUN 21 9  CREATININE 0.83 0.63  CALCIUM 8.4* 8.3*    PT/INR: No results for input(s): LABPROT, INR in the last 72 hours. ABG    Component Value Date/Time   PHART 7.346 (L) 08/05/2021 0105   HCO3 26.2 08/05/2021 0105   TCO2 28 08/05/2021 0105   ACIDBASEDEF 1.0 08/04/2021 2140   O2SAT 98.0 08/05/2021 0105   CBG (last 3)  Recent Labs    08/26/21 1715 08/26/21 2053 08/27/21 0551  GLUCAP 159* 151* 126*    Meds Scheduled Meds:  amLODipine  10 mg Oral Daily   aspirin EC  325 mg Oral Daily   atorvastatin  40 mg Oral Daily   bethanechol  5 mg Oral TID   bisacodyl  10 mg Oral Daily   Or   bisacodyl   10 mg Rectal Daily   Chlorhexidine Gluconate Cloth  6 each Topical Q0600   enoxaparin (LOVENOX) injection  40 mg Subcutaneous QHS   gabapentin  100 mg Oral BID   gabapentin  600 mg Oral QHS   insulin aspart  0-15 Units Subcutaneous TID WC   lidocaine  1 patch Transdermal Q24H   magnesium oxide  400 mg Oral BID   multivitamin with minerals  1 tablet Oral Daily   mupirocin ointment  1 application Nasal BID   pantoprazole  40 mg Oral QHS   vitamin B-6  50 mg Oral Daily   sertraline  100 mg Oral Daily   Vitamin D (Ergocalciferol)  50,000 Units Oral Q7 days   Continuous Infusions:  sodium chloride 100 mL/hr at 08/27/21 0623    ceFAZolin (ANCEF) IV 2 g (08/27/21 0554)   PRN Meds:.acetaminophen, ALPRAZolam, lidocaine, magnesium hydroxide, methocarbamol, ondansetron, traMADol  Xrays No results found.  Assessment/Plan: S/P Procedure(s) (LRB): STERNAL WOUND DRAINAGE AND DEBRIDEMENT (N/A) APPLICATION OF WOUND VAC (N/A)  1 afeb, VSS s BP quite variable 110's-170's 2 abx narrowed to ancef for MSSA 3 no leukocytosis 4 anemia is stable 5 normal renal fxn 6 vac change in OR  this week   LOS: 16 days    John Giovanni PA-C Pager [phone] 08/27/2021

## 2021-08-27 NOTE — Progress Notes (Signed)
Occupational Therapy Session Note  Patient Details  Name: [name] MRN: [id number] Date of Birth: 1956-12-06  Today's Date: 08/27/2021 OT Individual Time: [id number] OT Individual Time Calculation (min): 10 min  and Today's Date: 08/27/2021 OT Missed Time: 50 Minutes Missed Time Reason: Patient fatigue   Short Term Goals: Week 3:  OT Short Term Goal 1 (Week 3): STG = LTGs due to ELOS  Skilled Therapeutic Interventions/Progress Updates:    Limited treatment session secondary to fatigue.  Pt received supine in bed asleep, arousing with increased time.  Pt reports being "worn out" from previous therapy sessions and being worried about tomorrow's surgery.  Pt again voicing frustration and concern about overnight tech handling of pt and new onset of pain.  Pt politely declining any therapy at this time due to fatigue and preoccupied with pain and upcoming surgery.   Therapy Documentation Precautions:  Precautions Precautions: Fall, Sternal Restrictions Weight Bearing Restrictions: Yes (sternal precautions) Other Position/Activity Restrictions: sternal precautions General: General OT Amount of Missed Time: 50 Minutes Vital Signs: Therapy Vitals Temp: 98.1 F (36.7 C) Temp Source: Oral Pulse Rate: 82 Resp: 20 BP: 138/67 Patient Position (if appropriate): Lying Oxygen Therapy SpO2: 96 % O2 Device: Room Air Pain:  Pt with c/o pain in incision.   Therapy/Group: Individual Therapy  Simonne Come 08/27/2021, 1:28 PM

## 2021-08-27 NOTE — Progress Notes (Signed)
Occupational Therapy Weekly Progress Note  Patient Details  Name: [name] MRN: [id number] Date of Birth: [birth year]  Beginning of progress report period: August 19, 2021 End of progress report period: August 27, 2021  Today's Date: 08/27/2021 OT Individual Time: [id number] OT Individual Time Calculation (min): 70 min    No long term goals set due to estimated LOS.  Due to infection in sternal incision pt with change of mental status, decreased arousal, and impaired mobility impacting ability to engage in therapy session.  Pt underwent surgery [id number] with application of wound vac for infection.  Pt demonstrating increased arousal and participation in therapy session, however requiring increased physical assistance with mobility.  Pt with new posterior LOB in sitting and requiring mod assist sit > stand and mod-max assist stand pivot transfers.  Pt requiring increased physical assistance with self-care tasks due to deconditioning and sternal precautions.    Patient continues to demonstrate the following deficits: muscle weakness, decreased cardiorespiratoy endurance, and decreased standing balance, decreased balance strategies, and difficulty maintaining precautions and therefore will continue to benefit from skilled OT intervention to enhance overall performance with BADL and Reduce care partner burden.  Patient progressing toward long term goals..  Continue plan of care.  OT Short Term Goals Week 2:  OT Short Term Goal 1 (Week 2): STG = LTGs due to remaining LOS OT Short Term Goal 1 - Progress (Week 2): Other (comment) (pt with medical status change requiring extended LOS) Week 3:  OT Short Term Goal 1 (Week 3): STG = LTGs due to ELOS  Skilled Therapeutic Interventions/Progress Updates:   Treatment session with focus on self-care retraining, functional mobility, and sit > stand.  Pt received semi-reclined in bed with RN in room.  Pt initially hesitant to engage in therapy session  but willing to engage in bathing and dressing from EOB.  Completed bed mobility with max assist for rolling due to LUE/LLE weakness impacting ability to maintain sternal precautions with rolling.  Engaged in [place] bathing/dressing seated EOB with improved sitting balance, however still intermittent posterior lean requiring cues to correct and pt to correct without physical assistance.  Pt required total assist for UB and LB dressing this session.  Engaged in sit > stand for LB dressing with mod assist and facilitation for anterior weight shift.  Engaged in stand pivot transfer bed > w/c with RW with mod-max assist due to difficulty maintaining LUE on RW despite hand orthosis on RW and requiring physical assistance for weight shifting to advance LLE.  Pt engaged in oral care with setup for items.  Pt remained upright in TIS to allow for trunk support and passed of to PT for next therapy session.  Therapy Documentation Precautions:  Precautions Precautions: Fall, Sternal Restrictions Weight Bearing Restrictions: Yes (sternal precautions) Other Position/Activity Restrictions: sternal precautions General:   Vital Signs: Therapy Vitals Temp: 98.9 F (37.2 C) Temp Source: Oral Pulse Rate: 80 Resp: 18 BP: (!) 171/68 Patient Position (if appropriate): Lying Oxygen Therapy SpO2: 94 % O2 Device: Room Air Pain: Pain Assessment Pain Scale: 0-10 Pain Score: 3  Pain Type: Surgical pain Pain Location: Chest Pain Orientation: Mid;Right Pain Descriptors / Indicators: Aching;Discomfort Pain Frequency: Intermittent Pain Onset: Gradual Patients Stated Pain Goal: 2 Pain Intervention(s): Medication (See eMAR)   Therapy/Group: Individual Therapy  Eoghan Belcher, [place] 08/27/2021, 8:00 AM

## 2021-08-27 NOTE — Progress Notes (Signed)
Physical Therapy Session Note  Patient Details  Name: [name] MRN: [id number] Date of Birth: Oct 25, 1956  Today's Date: 08/27/2021 PT Individual Time: [id number] PT Individual Time Calculation (min): 69 min   Short Term Goals: Week 2:  PT Short Term Goal 1 (Week 2): STG=LTG due to LOS Week 3:  PT Short Term Goal 1 (Week 3): STG=LTG due to extended LOS due to medical complications  Skilled Therapeutic Interventions/Progress Updates:   Received pt sitting in TIS WC, pt agreeable to PT treatment, and reported 5/10 pain along sternal incision. RN notified but reported pt not due for more pain medication yet. Repsorioning, rest breaks, and distraction done to reduce pain levels. RN disconnected IV for therapy and pt reported not sleeping well last night due to interruptions. Session with emphasis on functional mobility/transfers, generalized strengthening, dynamic standing balance/coordination, standing tolerance, toileting, and improved endurance with activity. Pt transported to/from room on 5C in TIS WC total A for time management purposes and MD present for morning rounds. Sit<>stand at railing in hallway with RW and mod A x 2 trials with total A to manage LUE and position in RW hand orhosis. Attempted to take steps, however pt unable to advance LLE and when weight shifting to L to advance RLE, LLE buckled, resulting in controlled descent into WC. Pt very anxious about upcoming surgery, stating multiple times throughout session "I'll feel better once surgery is over tomorrow". In dayroom, pt performed seated BLE strengthening on Kinetron at 20 cm/sec for 1 minute x 4 trials with emphasis on quad/glute strengthening with cues for anterior weight shifting to activate core. Pt reported urge to void and requested to try bedside commode. Stand<>pivot TIS WC<>bedside commode with RW and mod A and pt required total A for clothing management -difficulty lifting LLE and sliding it backwards along floor. Pt  able to void and perform hygiene management seated with supervision. Stand<>pivot bedside commode<>bed with RW and mod A in same manner and sit<>supine with mod A for LLE management. Pt scooted to [facility], In with max A and use of Trendelenburg bed position with cues to push using LE's only. Located pt's notebook for her to pay bills and provided fresh drink. Concluded session with pt semi-reclined in bed, needs within reach, and bed alarm on.   Therapy Documentation Precautions:  Precautions Precautions: Fall, Sternal Restrictions Weight Bearing Restrictions: Yes (sternal precautions) Other Position/Activity Restrictions: sternal precautions  Therapy/Group: Individual Therapy Alfonse Alpers PT, DPT   08/27/2021, 7:29 AM

## 2021-08-27 NOTE — Progress Notes (Signed)
New order for consent on sternum debridement scheduled for tomorrow obtained and placed in patient's chart.
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
## 2021-08-28 ENCOUNTER — Inpatient Hospital Stay (HOSPITAL_COMMUNITY): Payer: 59 | Admitting: Anesthesiology

## 2021-08-28 ENCOUNTER — Encounter (HOSPITAL_COMMUNITY)
Admission: RE | Disposition: A | Discharge status: Home Health | Payer: Self-pay | Source: Intra-hospital | Attending: Physical Medicine and Rehabilitation

## 2021-08-28 ENCOUNTER — Encounter (HOSPITAL_COMMUNITY): Payer: Self-pay | Admitting: Physical Medicine and Rehabilitation

## 2021-08-28 DIAGNOSIS — I9789 Other postprocedural complications and disorders of the circulatory system, not elsewhere classified: Secondary | ICD-10-CM

## 2021-08-28 HISTORY — PX: STERNAL WOUND DEBRIDEMENT: SHX1058

## 2021-08-28 HISTORY — PX: APPLICATION OF WOUND VAC: SHX5189

## 2021-08-28 LAB — GLUCOSE, CAPILLARY
Glucose-Capillary: 146 mg/dL — ABNORMAL HIGH (ref 70–99)
Glucose-Capillary: 145 mg/dL — ABNORMAL HIGH (ref 70–99)
Glucose-Capillary: 148 mg/dL — ABNORMAL HIGH (ref 70–99)
Glucose-Capillary: 120 mg/dL — ABNORMAL HIGH (ref 70–99)
Glucose-Capillary: 176 mg/dL — ABNORMAL HIGH (ref 70–99)

## 2021-08-28 SURGERY — DEBRIDEMENT, WOUND, STERNUM
Anesthesia: General

## 2021-08-28 MED ORDER — PROPOFOL 10 MG/ML IV BOLUS
INTRAVENOUS | Status: DC | PRN
  Administered 2021-08-28: 70 mg via INTRAVENOUS

## 2021-08-28 MED ORDER — MIDAZOLAM HCL 2 MG/2ML IJ SOLN
INTRAMUSCULAR | Status: DC | PRN
  Administered 2021-08-28: 1 mg via INTRAVENOUS

## 2021-08-28 MED ORDER — CHLORHEXIDINE GLUCONATE 0.12 % MT SOLN
OROMUCOSAL | Status: AC
  Administered 2021-08-28: 15 mL
  Filled 2021-08-28: qty 15

## 2021-08-28 MED ORDER — PHENYLEPHRINE HCL-NACL 20-0.9 MG/250ML-% IV SOLN
INTRAVENOUS | Status: DC | PRN
  Administered 2021-08-28: 25 ug/min via INTRAVENOUS

## 2021-08-28 MED ORDER — LACTATED RINGERS IV SOLN: INTRAVENOUS | Status: DC

## 2021-08-28 MED ORDER — PROPOFOL 10 MG/ML IV BOLUS
INTRAVENOUS | Status: AC
  Filled 2021-08-28: qty 20

## 2021-08-28 MED ORDER — MIDAZOLAM HCL 2 MG/2ML IJ SOLN
INTRAMUSCULAR | Status: AC
  Filled 2021-08-28: qty 2

## 2021-08-28 MED ORDER — ORAL CARE MOUTH RINSE
15.0000 mL | Freq: Once | OROMUCOSAL | Status: AC
Start: 2021-08-28 — End: 2021-08-27

## 2021-08-28 MED ORDER — ONDANSETRON HCL 4 MG/2ML IJ SOLN
INTRAMUSCULAR | Status: DC | PRN
  Administered 2021-08-28: 4 mg via INTRAVENOUS

## 2021-08-28 MED ORDER — FENTANYL CITRATE (PF) 250 MCG/5ML IJ SOLN
INTRAMUSCULAR | Status: DC | PRN
  Administered 2021-08-28 (×2): 50 ug via INTRAVENOUS

## 2021-08-28 MED ORDER — CHLORHEXIDINE GLUCONATE 0.12 % MT SOLN: 15.0000 mL | Freq: Once | OROMUCOSAL | Status: AC

## 2021-08-28 MED ORDER — FENTANYL CITRATE (PF) 250 MCG/5ML IJ SOLN
INTRAMUSCULAR | Status: AC
  Filled 2021-08-28: qty 5

## 2021-08-28 MED ORDER — BETHANECHOL CHLORIDE 10 MG PO TABS
5.0000 mg | ORAL_TABLET | Freq: Two times a day (BID) | ORAL | Status: DC
  Administered 2021-08-28 – 2021-09-03 (×12): 5 mg via ORAL
  Filled 2021-08-28 (×12): qty 1

## 2021-08-28 MED ORDER — 0.9 % SODIUM CHLORIDE (POUR BTL) OPTIME
TOPICAL | Status: DC | PRN
  Administered 2021-08-28: 2000 mL

## 2021-08-28 MED ORDER — LIDOCAINE 2% (20 MG/ML) 5 ML SYRINGE
INTRAMUSCULAR | Status: DC | PRN
  Administered 2021-08-28: 60 mg via INTRAVENOUS

## 2021-08-28 SURGICAL SUPPLY — 29 items
BLADE CLIPPER SURG (BLADE) ×2 IMPLANT
BLADE SURG 10 STRL SS (BLADE) ×1 IMPLANT
BRUSH SCRUB EZ PLAIN DRY (MISCELLANEOUS) ×2 IMPLANT
CANISTER SUCT 3000ML PPV (MISCELLANEOUS) ×2 IMPLANT
CANISTER WOUND CARE 500ML ATS (WOUND CARE) ×1 IMPLANT
COVER SURGICAL LIGHT HANDLE (MISCELLANEOUS) ×1 IMPLANT
DRAPE LAPAROSCOPIC ABDOMINAL (DRAPES) ×2 IMPLANT
DRAPE WARM FLUID 44X44 (DRAPES) ×1 IMPLANT
DRSG VAC ATS SM SENSATRAC (GAUZE/BANDAGES/DRESSINGS) ×1 IMPLANT
ELECT REM PT RETURN 9FT ADLT (ELECTROSURGICAL) ×2
ELECTRODE REM PT RTRN 9FT ADLT (ELECTROSURGICAL) ×1 IMPLANT
GLOVE SURG SIGNA 7.5 PF LTX (GLOVE) ×3 IMPLANT
GLOVE SURG UNDER POLY LF SZ6.5 (GLOVE) ×1 IMPLANT
GOWN STRL REUS W/ TWL LRG LVL3 (GOWN DISPOSABLE) ×2 IMPLANT
GOWN STRL REUS W/ TWL XL LVL3 (GOWN DISPOSABLE) ×1 IMPLANT
GOWN STRL REUS W/TWL LRG LVL3 (GOWN DISPOSABLE) ×4
GOWN STRL REUS W/TWL XL LVL3 (GOWN DISPOSABLE) ×2
KIT BASIN OR (CUSTOM PROCEDURE TRAY) ×2 IMPLANT
KIT TURNOVER KIT B (KITS) ×2 IMPLANT
NS IRRIG 1000ML POUR BTL (IV SOLUTION) ×4 IMPLANT
PACK SURGICAL SETUP 50X90 (CUSTOM PROCEDURE TRAY) ×1 IMPLANT
PENCIL BUTTON HOLSTER BLD 10FT (ELECTRODE) ×1 IMPLANT
SPONGE T-LAP 18X18 ~~LOC~~+RFID (SPONGE) ×3 IMPLANT
SYR BULB IRRIG 60ML STRL (SYRINGE) ×1 IMPLANT
TOWEL GREEN STERILE (TOWEL DISPOSABLE) ×2 IMPLANT
TOWEL GREEN STERILE FF (TOWEL DISPOSABLE) ×2 IMPLANT
TUBE CONNECTING 12X1/4 (SUCTIONS) ×1 IMPLANT
WATER STERILE IRR 1000ML POUR (IV SOLUTION) ×2 IMPLANT
YANKAUER SUCT BULB TIP NO VENT (SUCTIONS) ×1 IMPLANT

## 2021-08-28 NOTE — Op Note (Signed)
NAME: [name], [name] MEDICAL RECORD NO: [id number] ACCOUNT NO: [id number] DATE OF BIRTH: 1956/10/14 FACILITY: [facility] LOCATION: [facility] PHYSICIAN: Revonda Standard. Roxan Hockey, MD  Operative Report   DATE OF PROCEDURE: 08/28/2021  PREOPERATIVE DIAGNOSIS:  Sternal wound infection, status post VAC placement.  POSTOPERATIVE DIAGNOSIS:  Sternal wound infection, status post VAC placement.  PROCEDURE:  Excisional debridement of sternal wound, VAC sponge change.  SURGEON:  Revonda Standard. Roxan Hockey, MD  ASSISTANT:  None.  ANESTHESIA:  General.  FINDINGS:  Wound clean and granulating over approximately 90% with some minimal necrotic subcutaneous tissue, no purulence.  CLINICAL NOTE:  The patient is a 65 year old woman who had recently undergone coronary artery bypass grafting and excision of a fibroelastoma of the aortic valve.  She developed a sternal wound infection while in rehab and underwent incision and drainage  by Dr. Cyndia Bent. He placed a VAC at that time.  She now returns for a wound VAC change and additional debridement as necessary.  The indications, risks, benefits, and alternatives were discussed in detail with the patient.  She accepted the risks and  agreed to proceed.  DESCRIPTION OF PROCEDURE:  The patient was brought to the operating room on 08/28/2021.  She had induction of general anesthesia.  The VAC sponge was removed, and the chest was prepped and draped in the usual sterile fashion.  A timeout was performed.   The wound was inspected.  There was granulation over approximately 90% of the wound surface area.  There was a small amount of necrotic subcutaneous tissue.  Excisional debridement was performed on this area back to viable bleeding tissue.  There was no  purulence.  There was no connection with the mediastinum or the sternum.  A VAC sponge was cut to fit the wound defect, which measured 7 cm in length, 3 cm in width, and 2 cm in depth at its deepest portion and about 1 cm in  depth over the majority of  the wound. Dressing was applied.  The VAC was attached.  The patient then was extubated in the operating room and taken to the postoperative care unit in good condition.   [facility] D: 08/28/2021 8:51:42 am T: 08/28/2021 9:58:00 am  JOB: [id number]

## 2021-08-28 NOTE — Interval H&P Note (Signed)
History and Physical Interval Note:  08/28/2021 7:42 AM  [name]  has presented today for surgery, with the diagnosis of STERNAL WOUND.  The various methods of treatment have been discussed with the patient and family. After consideration of risks, benefits and other options for treatment, the patient has consented to  Procedure(s): STERNAL WOUND DEBRIDEMENT (N/A) WOUND VAC CHANGE (N/A) as a surgical intervention.  The patient's history has been reviewed, patient examined, no change in status, stable for surgery.  I have reviewed the patient's chart and labs.  Questions were answered to the patient's satisfaction.     [name]

## 2021-08-28 NOTE — Plan of Care (Signed)
  Problem: Consults Goal: RH GENERAL PATIENT EDUCATION Description: See Patient Education module for education specifics. Outcome: Progressing   Problem: RH BOWEL ELIMINATION Goal: RH STG MANAGE BOWEL WITH ASSISTANCE Description: STG Manage Bowel with mod I Assistance. Outcome: Progressing   Problem: RH BOWEL ELIMINATION Goal: RH STG MANAGE BOWEL W/MEDICATION W/ASSISTANCE Description: STG Manage Bowel with Medication with mod I Assistance. Outcome: Progressing   Problem: RH SKIN INTEGRITY Goal: RH STG SKIN FREE OF INFECTION/BREAKDOWN Description: W min assist Outcome: Progressing   Problem: RH SAFETY Goal: RH STG ADHERE TO SAFETY PRECAUTIONS W/ASSISTANCE/DEVICE Description: STG Adhere to Safety Precautions With cues/Assistance/Device. Outcome: Progressing   Problem: RH PAIN MANAGEMENT Goal: RH STG PAIN MANAGED AT OR BELOW PT'S PAIN GOAL Description: At or below level 4 Outcome: Progressing

## 2021-08-28 NOTE — Anesthesia Preprocedure Evaluation (Signed)
Anesthesia Evaluation  Patient identified by MRN, date of birth, ID bandGeneral Assessment Comment: Drowsy but arousable   Reviewed: Allergy & Precautions, NPO status , Patient's Chart, lab work & pertinent test results, reviewed documented beta blocker date and time   History of Anesthesia Complications Negative for: history of anesthetic complications  Airway Mallampati: II  TM Distance: >3 FB Neck ROM: Full    Dental  (+) Partial Lower, Dental Advisory Given   Pulmonary former smoker,    Pulmonary exam normal        Cardiovascular hypertension, Pt. on medications and Pt. on home beta blockers Normal cardiovascular exam   '22 Carotid US - 1-39% b/l ICAS  '22 TEE - EF 60 to 65%. Prominent papillary muscle is noted in RV. LA size was mildly dilated. Trileaflet aortic valve with large well defined shimmering highly mobile mass on the aortic side of the valve. 3D images obtained to try to delineate the origin of the mass as difficult to discern whether this is originating from the valve leaflet or the aortic wall. 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 No AS or AI.  '22 Coronary CT - Disease/stenosis noted mainly in RCA, LAD, and LCx   Neuro/Psych PSYCHIATRIC DISORDERS Anxiety Depression CVA (left sided weakness), Residual Symptoms    GI/Hepatic Neg liver ROS, GERD  Medicated and Controlled,  Endo/Other  diabetes, Type 2, Oral Hypoglycemic Agents, Insulin Dependent Obesity   Renal/GU negative Renal ROS  Female GU complaint     Musculoskeletal  (+) Arthritis ,   Abdominal   Peds  Hematology  (+) anemia ,   Anesthesia Other Findings   Reproductive/Obstetrics                             Anesthesia Physical  Anesthesia Plan  ASA: 3  Anesthesia Plan: General   Post-op Pain Management:    Induction:  Intravenous  PONV Risk Score and Plan: 3 and Treatment may vary due to age or medical condition, Ondansetron and Dexamethasone  Airway Management Planned: LMA  Additional Equipment: None  Intra-op Plan:   Post-operative Plan: Extubation in OR  Informed Consent: I have reviewed the patients History and Physical, chart, labs and discussed the procedure including the risks, benefits and alternatives for the proposed anesthesia with the patient or authorized representative who has indicated his/her understanding and acceptance.     Dental advisory given  Plan Discussed with: CRNA and Anesthesiologist  Anesthesia Plan Comments:         Anesthesia Quick Evaluation

## 2021-08-28 NOTE — Transfer of Care (Signed)
Immediate Anesthesia Transfer of Care Note  Patient: [name]  Procedure(s) Performed: STERNAL WOUND DEBRIDEMENT WOUND VAC CHANGE  Patient Location: PACU   Anesthesia Type:General  Level of Consciousness: awake, alert  and oriented  Airway & Oxygen Therapy: Patient Spontanous Breathing and Patient connected to face mask oxygen  Post-op Assessment: Report given to RN and Post -op Vital signs reviewed and stable  Post vital signs: Reviewed and stable  Last Vitals:  Vitals Value Taken Time  BP 145/77 08/28/21 0900  Temp 36.3 C 08/28/21 0900  Pulse 92 08/28/21 0907  Resp 10 08/28/21 0907  SpO2 94 % 08/28/21 0907  Vitals shown include unvalidated device data.  Last Pain:  Vitals:   08/28/21 0900  TempSrc:   PainSc: 0-No pain      Patients Stated Pain Goal: 3 ([id number])  Complications: No notable events documented.

## 2021-08-28 NOTE — Anesthesia Postprocedure Evaluation (Signed)
Anesthesia Post Note  Patient: [name]  Procedure(s) Performed: [place] CHANGE     Patient location during evaluation: PACU Anesthesia Type: General Level of consciousness: awake and alert Pain management: pain level controlled Vital Signs Assessment: post-procedure vital signs reviewed and stable Respiratory status: spontaneous breathing, nonlabored ventilation, respiratory function stable and patient connected to nasal cannula oxygen Cardiovascular status: stable and blood pressure returned to baseline Anesthetic complications: no   No notable events documented.  Last Vitals:  Vitals:   08/28/21 0930 08/28/21 0947  BP: 110/65 114/71  Pulse: 84 83  Resp: 12 16  Temp: 36.8 C 36.6 C  SpO2: 93% 94%    Last Pain:  Vitals:   08/28/21 0947  TempSrc: Oral  PainSc:                  [name]

## 2021-08-28 NOTE — Progress Notes (Signed)
Gave a report to [place] from [place]. Pt taken to OR.

## 2021-08-28 NOTE — Brief Op Note (Signed)
08/28/2021  8:45 AM  PATIENT:  [name]  65 y.o. female  PRE-OPERATIVE DIAGNOSIS:  STERNAL WOUND INFECTION  POST-OPERATIVE DIAGNOSIS:  STERNAL WOUND INFECTION  PROCEDURE:  Procedure(s): STERNAL WOUND DEBRIDEMENT (N/A) WOUND VAC CHANGE (N/A)  SURGEON:  Surgeon(s) and Role:    * [name], MD - Primary  PHYSICIAN ASSISTANT:   ASSISTANTS: none   ANESTHESIA:   general  EBL:  minimal   BLOOD ADMINISTERED:none  DRAINS:  VAC    LOCAL MEDICATIONS USED:  NONE  SPECIMEN:  No Specimen  DISPOSITION OF SPECIMEN:  N/A  COUNTS:  YES  TOURNIQUET:  * No tourniquets in log *  DICTATION: .Other Dictation: Dictation Number -  PLAN OF CARE:  return to rehab  PATIENT DISPOSITION:  PACU - hemodynamically stable.   Delay start of Pharmacological VTE agent (>24hrs) due to surgical blood loss or risk of bleeding: no

## 2021-08-28 NOTE — Progress Notes (Signed)
PROGRESS NOTE   Subjective/Complaints: No complaints this morning Returned form OR with no complications- wound vac still in place- she is asking when this will be removed and I discussed that this will be determined by surgery.   ROS: urinary retention resolved, +weak, chest pain improved   Objective:   No results found. Recent Labs    08/26/21 0624 08/27/21 0546  WBC 12.2* 8.3  HGB 9.0* 8.8*  HCT 28.0* 27.6*  PLT 330 296    Recent Labs    08/26/21 0624 08/27/21 0546  NA 133* 135  K 4.1 3.5  CL 101 100  CO2 22 24  GLUCOSE 155* 130*  BUN 21 9  CREATININE 0.83 0.63  CALCIUM 8.4* 8.3*     Intake/Output Summary (Last 24 hours) at 08/28/2021 1051 Last data filed at 08/28/2021 1007 Gross per 24 hour  Intake 2468 ml  Output 1420 ml  Net 1048 ml   Physical Exam: Vital Signs Blood pressure 114/71, pulse 83, temperature 97.8 F (36.6 C), temperature source Oral, resp. rate 16, height '5\' 7"'$  (1.702 m), weight 95.8 kg, SpO2 94 %. Constitutional: No distress . Vital signs reviewed. HEENT: NCAT, EOMI, oral membranes moist Neck: supple Cardiovascular: RRR without murmur. No JVD    Respiratory/Chest: CTA Bilaterally without wheezes or rales. Normal effort    GI/Abdomen: BS +, non-tender, non-distended Ext: left hand swollen- nontender to palpation Psych: still a little lethargic but more appropriate and alert Skin: Wound vac in place on sternum- minimal drainage. C/D/I  Neurological:     Comments:makes eye contact. Follows simple commands. Still a little slow to process but demonstrates awareness of what's going on.  Residual left sided weakness from prior stroke, wrist drop. 5/5 throughout but 0/5 left wrist extension, 3/5 left wrist hand grip.  MSK: Able to tale 1 step  Assessment/Plan: 1. Functional deficits which require 3+ hours per day of interdisciplinary therapy in a comprehensive inpatient rehab  setting. Physiatrist is providing close team supervision and 24 hour management of active medical problems listed below. Physiatrist and rehab team continue to assess barriers to discharge/monitor patient progress toward functional and medical goals  Care Tool:  Bathing    Body parts bathed by patient: Left arm, Abdomen, Face, Chest   Body parts bathed by helper: Right arm, Right upper leg, Left upper leg, Right lower leg, Left lower leg     Bathing assist Assist Level: Maximal Assistance - Patient 24 - 49%     Upper Body Dressing/Undressing Upper body dressing   What is the patient wearing?: Button up shirt    Upper body assist Assist Level: Total Assistance - Patient < 25%    Lower Body Dressing/Undressing Lower body dressing      What is the patient wearing?: Pants     Lower body assist Assist for lower body dressing: Total Assistance - Patient < 25%     Toileting Toileting    Toileting assist Assist for toileting: Total Assistance - Patient < 25%     Transfers Chair/bed transfer  Transfers assist     Chair/bed transfer assist level: Moderate Assistance - Patient 50 - 74%     Locomotion Ambulation  Ambulation assist      Assist level: Moderate Assistance - Patient 50 - 74% Assistive device: Walker-rolling Max distance: 73f   Walk 10 feet activity   Assist     Assist level: Moderate Assistance - Patient - 50 - 74% Assistive device: Walker-rolling   Walk 50 feet activity   Assist Walk 50 feet with 2 turns activity did not occur: Safety/medical concerns (fatigue, LE weakness, decreased balance/postural control, deconditioning)  Assist level: Moderate Assistance - Patient - 50 - 74%      Walk 150 feet activity   Assist Walk 150 feet activity did not occur: Safety/medical concerns (fatigue, LE weakness, decreased balance/postural control, deconditioning)         Walk 10 feet on uneven surface  activity   Assist Walk 10 feet on  uneven surfaces activity did not occur: Safety/medical concerns (fatigue, LE weakness, decreased balance/postural control, deconditioning)         Wheelchair     Assist Is the patient using a wheelchair?: Yes Type of Wheelchair: Manual Wheelchair activity did not occur: Safety/medical concerns (fatigue, LE weakness, decreased balance/postural control, deconditioning)         Wheelchair 50 feet with 2 turns activity    Assist    Wheelchair 50 feet with 2 turns activity did not occur: Safety/medical concerns (fatigue, LE weakness, decreased balance/postural control, deconditioning)       Wheelchair 150 feet activity     Assist  Wheelchair 150 feet activity did not occur: Safety/medical concerns (fatigue, LE weakness, decreased balance/postural control, deconditioning)       Blood pressure 114/71, pulse 83, temperature 97.8 F (36.6 C), temperature source Oral, resp. rate 16, height '5\' 7"'$  (1.702 m), weight 95.8 kg, SpO2 94 %.  Medical Problem List and Plan: 1.  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  Status post median sternotomy resection of fibroelastoma from aortic valve/CABG x4 08/04/2021.  Sternal precautions             -ELOS/Goals: 15 days, d/c extended until Friday            Continue CIR- PT, OT   2.  Impaired mobility, ambulation decreased from 40 to 1 feet following sternal infection/associated fatigue, encouraged to resume ambulation tomorrow with therapy -DVT/anticoagulation:  Pharmaceutical: continue Lovenox             -antiplatelet therapy: Continue Aspirin 325 mg daily 3. Post-stroke left hand pain: Robaxin and tramadol as needed. Add B6 '50mg'$  daily to improve sensitivity to pain. Decrease Gabapentin to '100mg'$  BID and '600mg'$  HS. Continue current regimen which appears to be effective for her.  4. Depression/Anxiety Continue Zoloft 100 mg daily, decrease xanax to daily PRN             -antipsychotic agents: N/A 5.  Neuropsych: This patient is capable of making decisions on her own behalf. 6. Chest incision: Healing well, continue Routine skin checks 7. Fluids/Electrolytes/Nutrition: Routine in and outs with follow-up chemistries 8.ABLA. Hgb reviewed and stable, monitor weekly.  9.  Diabetes mellitus with peripheral neuropathy.  Hemoglobin A1c 7.8.  DiaBeta 5 mg daily, Glucophage 1000 mg twice daily. Discuss Qutenza 10  Hypertension.  increase Norvasc to '10mg'$ , continue Lasix 40 mg daily, hydralazine 10 mg nightly, Cozaar 100 mg daily, Lopressor 12.5 mg twice daily.  Monitor with increased mobility 9/11 HOLDING BP MEDS GIVEN HYPOTENSION. 11.  GERD.  Protonix 12.  Restless leg syndrome.  Requip nightly--HELD dt lethargy 13.  Hyperlipidemia.  Lipitor 14.  Obesity.  BMI 30.83.  Dietary follow-up 15.  History of right thalamic infarction 5/22.  Received CIR.  Continue aspirin. 16. Left wrist drop: splint ordered 17. Vitamin D deficiency: start ergocalciferol 50,000U once per week for 7 weeks 18. Suboptimal magnesium: start '250mg'$  magnesium gluconate HS. Conitnue magnesium oxide '400mg'$  BID  19. Dry mouth: stop Cymbalta. Improved 20. Dizziness/weakness: UA normal, UC pending. CT ordered given significant decline and risk factors for stroke- negative.  21. Leukocytosis: WBC up again 9/13, appreciate CT surgery following, repeat CBC tomorrow.  -s/p vac placement for sternal infection.  -repeat cbc tomorrow -continue Cefepime 22. Hyponatremia- lasix stopped 9/9    Na+ up to 133  -continue NS at 100cc/hr, post-op per [facility]VS 23. AKI:  BUN/Cr improved today   -K+ 5.2: lasix and Kayexalate ordered.   -see above  -normalized 14. Urinary retention: counseled regarding importance of movement, preventing constipation. Start bethanacol '5mg'$  TID.  15. Left hand swelling: continue to elevate, asked RN to apply ice.  16. Postsurgical chest pain: change Tylenol to Tylenol #3 q8H prn- improved  LOS: 17 days A FACE TO FACE  EVALUATION WAS PERFORMED  Martha Clan P Arney Mayabb 08/28/2021, 10:51 AM

## 2021-08-28 NOTE — Progress Notes (Signed)
Patient received from recovery, awake, drowsy, oriented x 4, following commands.  Denies pain.  Sternal wound vac in place CDI.  Assisted in position of comfort, needs addressed.

## 2021-08-28 NOTE — Anesthesia Procedure Notes (Signed)
Procedure Name: LMA Insertion Date/Time: 08/28/2021 8:12 AM Performed by: Mariea Clonts, CRNA Pre-anesthesia Checklist: Patient identified, Emergency Drugs available, Suction available and Patient being monitored Patient Re-evaluated:Patient Re-evaluated prior to induction Oxygen Delivery Method: Circle System Utilized Preoxygenation: Pre-oxygenation with 100% oxygen Induction Type: IV induction Ventilation: Mask ventilation without difficulty LMA: LMA inserted LMA Size: 4.0 Number of attempts: 1 Airway Equipment and Method: Bite block Placement Confirmation: positive ETCO2 Tube secured with: Tape Dental Injury: Teeth and Oropharynx as per pre-operative assessment

## 2021-08-29 ENCOUNTER — Encounter (HOSPITAL_COMMUNITY): Payer: Self-pay | Admitting: Thoracic Surgery (Cardiothoracic Vascular Surgery)

## 2021-08-29 LAB — GLUCOSE, CAPILLARY
Glucose-Capillary: 131 mg/dL — ABNORMAL HIGH (ref 70–99)
Glucose-Capillary: 165 mg/dL — ABNORMAL HIGH (ref 70–99)
Glucose-Capillary: 170 mg/dL — ABNORMAL HIGH (ref 70–99)
Glucose-Capillary: 119 mg/dL — ABNORMAL HIGH (ref 70–99)

## 2021-08-29 LAB — AEROBIC/ANAEROBIC CULTURE W GRAM STAIN (SURGICAL/DEEP WOUND)

## 2021-08-29 MED ORDER — DOCUSATE SODIUM 50 MG PO CAPS
50.0000 mg | ORAL_CAPSULE | Freq: Two times a day (BID) | ORAL | Status: DC
  Administered 2021-08-29 – 2021-09-12 (×28): 50 mg via ORAL
  Filled 2021-08-29 (×29): qty 1

## 2021-08-29 MED ORDER — DOCUSATE SODIUM 50 MG/5ML PO LIQD: 50.0000 mg | Freq: Two times a day (BID) | ORAL | Status: DC

## 2021-08-29 MED ORDER — BLISTEX MEDICATED EX OINT
TOPICAL_OINTMENT | CUTANEOUS | Status: DC | PRN
  Filled 2021-08-29 (×2): qty 6.3

## 2021-08-29 NOTE — Progress Notes (Signed)
Physical Therapy Session Note  Patient Details  Name: [name] MRN: [id number] Date of Birth: 17-Apr-1956  Today's Date: 08/29/2021 PT Individual Time: [phone] and 1300-1357 PT Individual Time Calculation (min): 26 min and 57 min  Short Term Goals: Week 2:  PT Short Term Goal 1 (Week 2): STG=LTG due to LOS Week 3:  PT Short Term Goal 1 (Week 3): STG=LTG due to extended LOS due to medical complications  Skilled Therapeutic Interventions/Progress Updates:   Treatment Session 1 Received pt semi-reclined in bed, pt agreeable to PT treatment, and reported pain 5-6/10 in chest (premedicated). Repositioning and rest breaks done to reduce pain levels. Session with emphasis on functional mobility, generalized strengthening, dressing, and improved activity tolerance. Donned ted hose and socks in supine with total A and pants and brief with max A (+2 assist to roll completely on side). Pt able to bridge hips to assist in pulling pants up. Noted pt with increased wheezing with bed mobility (RN aware and present to assess pt). Pt transferred supine<>sitting EOB with HOB slightly elevated and mod A to maintain sternal precautions. Doffed gown and donned clean shirt with mod A while sitting EOB with cues for anterior weight shifting and to get feet flat on floor due to posterior lean. Sit<>supine with mod A and cues for technique and scooted to [facility] with max A with pt attempting to assist pushing through LEs. Concluded session with pt semi-reclined in bed, needs within reach, and bed alarm on.   Treatment Session 2  Received pt supine in bed on bedpan, pt agreeable to PT treatment, and reported her chest is much better now than this morning. Session with emphasis on functional mobility/transfers, generalized strengthening, dynamic standing balance/coordination, ambulation, and improved activity tolerance. Pt rolled R with mod A to get off bedpan, however bedpan leaked soiling brief, chuck pad, and sheets.  Removed bedpan and performed peri-care dependently and donned clean brief and placed new chuck pad. Pulled pants over hips with max A. Pt transferred supine<>sitting EOB with HOB slightly elevated and mod A with cues to maintain sternal precautions. Stand<>pivot bed<>TIS WC with RW and mod A. Pt demonstrated improvements in ability to step feet and advance LLE today. Pt performed x 4 additional sit<>stands with RW and min A throughout session - cues to scoot to edge of chair, max A to position LUE in RW hand orthosis, and cues for placement of LLE prior to standing. Upon standing pt reported feeling weak but very determined and agreed to try to walk. Pt ambulated 19f with RW and min A with +2 for close WC follow and to manage wound vac. Pt able to advance LLE very slowly and with decreased heel strike but demonstrated decreased cadence, generalized unsteadiness, flexed trunk/downward gaze, and slight "bouncing" in L knee but no true buckling noted. Pt very pleased with her progress but reported fatigue and requested to return to bed. With maximal encouragement pt agreed to remain sitting up in TIS WC since she has not been OOB in 2 days. Concluded session with pt semi-reclined in TIS WC, needs within reach, and seatbelt alarm on.   Therapy Documentation Precautions:  Precautions Precautions: Fall, Sternal Restrictions Weight Bearing Restrictions: No Other Position/Activity Restrictions: sternal precautions  Therapy/Group: Individual Therapy AAlfonse AlpersPT, DPT   08/29/2021, 7:25 AM

## 2021-08-29 NOTE — Progress Notes (Signed)
Patient ID: [name], female   DOB: 04/08/1956, 64 y.o.   MRN: [id number]  Met with pt to give her the number for her benefits to pay her insurance.She reports she got this fixed today and paid them what she owed. She has questions regarding her LTD and hospital indemnity insurance. Will see if can get phone number for her regarding this. 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 She voiced she is working in therapies. Currently she is much assist and not standing yet. She will work in her PT session this afternoon.  

## 2021-08-29 NOTE — Progress Notes (Signed)
PROGRESS NOTE   Subjective/Complaints: Chest feeling a little sore. Asks when wound vac to be removed and discussed that this will be determined by CT surgery C/ constipation  ROS: urinary retention resolved, +weak, chest pain improved, +constipation   Objective:   No results found. Recent Labs    08/27/21 0546  WBC 8.3  HGB 8.8*  HCT 27.6*  PLT 296    Recent Labs    08/27/21 0546  NA 135  K 3.5  CL 100  CO2 24  GLUCOSE 130*  BUN 9  CREATININE 0.63  CALCIUM 8.3*     Intake/Output Summary (Last 24 hours) at 08/29/2021 0931 Last data filed at 08/29/2021 0700 Gross per 24 hour  Intake 2824.94 ml  Output 450 ml  Net 2374.94 ml   Physical Exam: Vital Signs Blood pressure (!) 165/72, pulse 80, temperature 98.7 F (37.1 C), temperature source Oral, resp. rate 18, height '5\' 7"'$  (1.702 m), weight 95.8 kg, SpO2 96 %. Gen: no distress, normal appearing HEENT: oral mucosa pink and moist, NCAT Cardio: Reg rate Chest: normal effort, normal rate of breathing Abd: soft, non-distended  Ext: left hand swollen- nontender to palpation Psych: still a little lethargic but more appropriate and alert Skin: Wound vac in place on sternum- minimal drainage. C/D/I  Neurological:     Comments:makes eye contact. Follows simple commands. Still a little slow to process but demonstrates awareness of what's going on.  Residual left sided weakness from prior stroke, wrist drop. 5/5 throughout but 0/5 left wrist extension, 3/5 left wrist hand grip.  MSK: Able to tale 1 step  Assessment/Plan: 1. Functional deficits which require 3+ hours per day of interdisciplinary therapy in a comprehensive inpatient rehab setting. Physiatrist is providing close team supervision and 24 hour management of active medical problems listed below. Physiatrist and rehab team continue to assess barriers to discharge/monitor patient progress toward functional  and medical goals  Care Tool:  Bathing    Body parts bathed by patient: Left arm, Abdomen, Face, Chest   Body parts bathed by helper: Right arm, Right upper leg, Left upper leg, Right lower leg, Left lower leg     Bathing assist Assist Level: Maximal Assistance - Patient 24 - 49%     Upper Body Dressing/Undressing Upper body dressing   What is the patient wearing?: Button up shirt    Upper body assist Assist Level: Total Assistance - Patient < 25%    Lower Body Dressing/Undressing Lower body dressing      What is the patient wearing?: Pants     Lower body assist Assist for lower body dressing: Total Assistance - Patient < 25%     Toileting Toileting    Toileting assist Assist for toileting: Total Assistance - Patient < 25%     Transfers Chair/bed transfer  Transfers assist     Chair/bed transfer assist level: Moderate Assistance - Patient 50 - 74%     Locomotion Ambulation   Ambulation assist      Assist level: Moderate Assistance - Patient 50 - 74% Assistive device: Walker-rolling Max distance: 8f   Walk 10 feet activity   Assist     Assist level:  Moderate Assistance - Patient - 50 - 74% Assistive device: Walker-rolling   Walk 50 feet activity   Assist Walk 50 feet with 2 turns activity did not occur: Safety/medical concerns (fatigue, LE weakness, decreased balance/postural control, deconditioning)  Assist level: Moderate Assistance - Patient - 50 - 74%      Walk 150 feet activity   Assist Walk 150 feet activity did not occur: Safety/medical concerns (fatigue, LE weakness, decreased balance/postural control, deconditioning)         Walk 10 feet on uneven surface  activity   Assist Walk 10 feet on uneven surfaces activity did not occur: Safety/medical concerns (fatigue, LE weakness, decreased balance/postural control, deconditioning)         Wheelchair     Assist Is the patient using a wheelchair?: Yes Type of  Wheelchair: Manual Wheelchair activity did not occur: Safety/medical concerns (fatigue, LE weakness, decreased balance/postural control, deconditioning)         Wheelchair 50 feet with 2 turns activity    Assist    Wheelchair 50 feet with 2 turns activity did not occur: Safety/medical concerns (fatigue, LE weakness, decreased balance/postural control, deconditioning)       Wheelchair 150 feet activity     Assist  Wheelchair 150 feet activity did not occur: Safety/medical concerns (fatigue, LE weakness, decreased balance/postural control, deconditioning)       Blood pressure (!) 165/72, pulse 80, temperature 98.7 F (37.1 C), temperature source Oral, resp. rate 18, height '5\' 7"'$  (1.702 m), weight 95.8 kg, SpO2 96 %.  Medical Problem List and Plan: 1.  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  Status post median sternotomy resection of fibroelastoma from aortic valve/CABG x4 08/04/2021.  Sternal precautions             -ELOS/Goals: 15 days, d/c extended until Friday            Continue CIR- PT, OT   2.  Impaired mobility, ambulation decreased from 40 to 1 feet following sternal infection/associated fatigue, encouraged to resume ambulation tomorrow with therapy -DVT/anticoagulation:  Pharmaceutical: continue Lovenox             -antiplatelet therapy: Continue Aspirin 325 mg daily 3. Post-stroke left hand pain: Robaxin and tramadol as needed. Add B6 '50mg'$  daily to improve sensitivity to pain. Decrease Gabapentin to '100mg'$  BID and '600mg'$  HS. Continue current regimen which appears to be effective for her.  4. Depression/Anxiety Continue Zoloft 100 mg daily, decrease xanax to daily PRN             -antipsychotic agents: N/A 5. Neuropsych: This patient is capable of making decisions on her own behalf. 6. Chest incision: Healing well, continue Routine skin checks 7. Fluids/Electrolytes/Nutrition: Routine in and outs with follow-up chemistries 8.ABLA. Hgb  reviewed and stable, monitor weekly.  9.  Diabetes mellitus with peripheral neuropathy.  Hemoglobin A1c 7.8.  DiaBeta 5 mg daily, Glucophage 1000 mg twice daily. Discuss Qutenza 10  Hypertension.  increase Norvasc to '10mg'$ , continue Lasix 40 mg daily, hydralazine 10 mg nightly, Cozaar 100 mg daily, Lopressor 12.5 mg twice daily.  Monitor with increased mobility 9/11 HOLDING BP MEDS GIVEN HYPOTENSION. 11.  GERD.  Protonix 12.  Restless leg syndrome.  Requip nightly--HELD dt lethargy 13.  Hyperlipidemia.  Lipitor 14.  Obesity.  BMI 30.83.  Dietary follow-up 15.  History of right thalamic infarction 5/22.  Received CIR.  Continue aspirin. 16. Left wrist drop: splint ordered 17. Vitamin D deficiency: start ergocalciferol 50,000U once per  week for 7 weeks 18. Suboptimal magnesium: start '250mg'$  magnesium gluconate HS. Conitnue magnesium oxide '400mg'$  BID  19. Dry mouth: stop Cymbalta. Improved 20. Dizziness/weakness: UA normal, UC pending. CT ordered given significant decline and risk factors for stroke- negative.  21. Leukocytosis: WBC up again 9/13, appreciate CT surgery following, repeat CBC tomorrow.  -s/p vac placement for sternal infection.  -repeat cbc tomorrow -continue Cefepime 22. Hyponatremia- lasix stopped 9/9    Na+ up to 133  -may d/c fluids 23. AKI:  BUN/Cr improved today   -K+ 5.2: lasix and Kayexalate ordered.   -see above  -normalized 14. Urinary retention: counseled regarding importance of movement, preventing constipation. Start bethanacol '5mg'$  TID.  15. Left hand swelling: continue to elevate, asked RN to apply ice.  16. Postsurgical chest pain: change Tylenol to Tylenol #3 q8H prn- improved 17. Cracked lips: blistex PRN ordered.  18. Constipation: continue dulcolax, add colace '50mg'$  BID  LOS: 18 days A FACE TO FACE EVALUATION WAS PERFORMED  [name] 08/29/2021, 9:31 AM

## 2021-08-29 NOTE — Progress Notes (Signed)
Physical Therapy Session Note  Patient Details  Name: [name] MRN: [id number] Date of Birth: 07-22-1956  Today's Date: 08/29/2021 PT Individual Time: 1962-2297 PT Individual Time Calculation (min): 42 min   Short Term Goals: Week 1:  PT Short Term Goal 1 (Week 1): pt will perform bed mobility with min A consistantly PT Short Term Goal 1 - Progress (Week 1): Met PT Short Term Goal 2 (Week 1): pt will transfer bed<>chair with LRAD and min A PT Short Term Goal 2 - Progress (Week 1): Met PT Short Term Goal 3 (Week 1): pt will ambulate 35f with LRAD and min A PT Short Term Goal 3 - Progress (Week 1): Met Week 2:  PT Short Term Goal 1 (Week 2): STG=LTG due to LOS Week 3:  PT Short Term Goal 1 (Week 3): STG=LTG due to extended LOS due to medical complications  Skilled Therapeutic Interventions/Progress Updates:  Patient supine in bed on entrance to room. Patient alert but fatigued and agreeable to PT session.   Patient with pain at surgical incision site on movement of global movers. Addressed with education and participation in therex with focus on core stability prior to global mobility with reduction in pain noted by pt.   Therapeutic Activity: Pt declined any bed mobility or transfers d/t just completing bed mobility with NT for changing bed linens, brief and gown. Pt relating that she is very sore from mobility with NT and has only just taken pain medication 15 min prior to PT arrival. Education provided re: need for not only daily movement in/ out of bed but also continuous movement throughout each day in order to maintain ability to mobilize and progress toward PLOF.  Therapeutic Exercise: Patient performed the following supine exercises bilaterally with vc/ tc for proper technique and focus on deep core stabilization prior to BLE movements as well as slow eccentric return for max benefit. -SLR -Heel slides with resisted return into extension -Straight leg hip Abd/ Add with  light resistance added to pt's tolerance -Hooklying ER/ IR hip clamshells with resistance to pt's tolerance -Ankle pumps  Patient supine  in bed at end of session with brakes locked, bed alarm set, and all needs within reach.     Therapy Documentation Precautions:  Precautions Precautions: Fall, Sternal Restrictions Weight Bearing Restrictions: No Other Position/Activity Restrictions: sternal precautions General:   Vital Signs: Therapy Vitals Temp: 98.7 F (37.1 C) Temp Source: Oral Pulse Rate: 80 Resp: 18 BP: (!) 165/72 Patient Position (if appropriate): Lying Oxygen Therapy SpO2: 96 % O2 Device: Room Air Pain: No numerical score given, but Wong-Baker faces scale rated:   Therapy/Group: Individual Therapy  JAlger SimonsPT, DPT 08/29/2021, 9:20 AM

## 2021-08-29 NOTE — Progress Notes (Signed)
Occupational Therapy Session Note  Patient Details  Name: [name] MRN: [id number] Date of Birth: February 20, 1956  Today's Date: 08/29/2021 OT Individual Time: 1002-1013 OT Individual Time Calculation (min): 11 min  and Today's Date: 08/29/2021 OT Missed Time: 59 Minutes Missed Time Reason: Patient fatigue   Short Term Goals: Week 3:  OT Short Term Goal 1 (Week 3): STG = LTGs due to ELOS  Skilled Therapeutic Interventions/Progress Updates:    Treatment session limited secondary to fatigue.  Pt asleep upon arrival, opening eyes briefly but reporting pain meds "setting in" and reports unable to engage in any activity at this time as she is having difficulty remaining awake.  Discussed OR and wound vac still in place.  Therapist encouraged activity at EOB with pt still refusing.  Pt remained supine in bed with all needs in reach.  Pt missed 49 mins due to pain, fatigue, and refusal.  Therapy Documentation Precautions:  Precautions Precautions: Fall, Sternal Restrictions Weight Bearing Restrictions: No Other Position/Activity Restrictions: sternal precautions General: General OT Amount of Missed Time: 49 Minutes Pain: Pain Assessment Pain Score: 4    Therapy/Group: Individual Therapy  Simonne Come 08/29/2021, 12:19 PM

## 2021-08-30 DIAGNOSIS — I10 Essential (primary) hypertension: Secondary | ICD-10-CM

## 2021-08-30 DIAGNOSIS — E1142 Type 2 diabetes mellitus with diabetic polyneuropathy: Secondary | ICD-10-CM

## 2021-08-30 DIAGNOSIS — D62 Acute posthemorrhagic anemia: Secondary | ICD-10-CM

## 2021-08-30 LAB — GLUCOSE, CAPILLARY
Glucose-Capillary: 147 mg/dL — ABNORMAL HIGH (ref 70–99)
Glucose-Capillary: 138 mg/dL — ABNORMAL HIGH (ref 70–99)
Glucose-Capillary: 154 mg/dL — ABNORMAL HIGH (ref 70–99)
Glucose-Capillary: 169 mg/dL — ABNORMAL HIGH (ref 70–99)

## 2021-08-30 MED ORDER — POLYETHYLENE GLYCOL 3350 17 G PO PACK
17.0000 g | PACK | Freq: Two times a day (BID) | ORAL | Status: DC
  Administered 2021-08-30 – 2021-09-06 (×13): 17 g via ORAL
  Filled 2021-08-30 (×22): qty 1

## 2021-08-30 NOTE — Progress Notes (Signed)
Occupational Therapy Session Note  Patient Details  Name: [name] MRN: [id number] Date of Birth: Dec 11, 1956  Today's Date: 08/30/2021 OT Individual Time: 1015-1100 OT Individual Time Calculation (min): 45 min    Short Term Goals: Week 1:  OT Short Term Goal 1 (Week 1): Pt will complete sit > stand with CGA while maintaining sternal precautions OT Short Term Goal 1 - Progress (Week 1): Met OT Short Term Goal 2 (Week 1): Pt will complete LB dressing with min assist while adhering ot sternal precautions OT Short Term Goal 2 - Progress (Week 1): Met OT Short Term Goal 3 (Week 1): Pt will complete toileting (clothing management and hygiene) with CGA while adhering to sternal precautions OT Short Term Goal 3 - Progress (Week 1): Met  Skilled Therapeutic Interventions/Progress Updates:     Pt received in bed with no pain but reporting need to toilet out of 10 pain  ADL:  Pt completes bathing with set up for UB bathing, MAX A for LB bathing STS on BSC Pt completes UB dressing with VC for hemi doffing strategy and L attention as pt leaves shirt on L arm. No cuing needed for donning shirt Pt completes LB dressing with total A for time management and MOD A for Sit to stand rom BSC Pt completes footwear with total A for teds and non skid socks Pt completes toileting with MAX A via sit to stand for clothing management and MOD A for maintaining balance  Pt completes toileting transfer with MOD A for power up and VC for adherence to sternal precautions.   Pt left at end of session in bed with exit alarm on, call light in reach and all needs met   Therapy Documentation Precautions:  Precautions Precautions: Fall, Sternal Restrictions Weight Bearing Restrictions: No Other Position/Activity Restrictions: sternal precautions    Therapy/Group: Individual Therapy  [name] 08/30/2021, 6:58 AM

## 2021-08-30 NOTE — Progress Notes (Signed)
Physical Therapy Session Note  Patient Details  Name: [name] MRN: [id number] Date of Birth: [birth year]  Today's Date: 08/30/2021 PT Individual Time: 1102-1200 and 1259-1319 PT Individual Time Calculation (min): 58 min and 20 min  PT Missed Time: 10 minutes PT Missed Time Reason: Eating lunch  Short Term Goals: Week 2:  PT Short Term Goal 1 (Week 2): STG=LTG due to LOS Week 3:  PT Short Term Goal 1 (Week 3): STG=LTG due to extended LOS due to medical complications  Skilled Therapeutic Interventions/Progress Updates:   Treatment Session 1 Received pt sitting in TIS WC donning shirt with OT, pt agreeable to PT treatment, and denied any pain during session. Session with emphasis on functional mobility/transfers, generalized strengthening, standing tolerance, gait training, toileting, and improved endurance with activity. Pt transported to/from room on 5C in TIS WC total A for time management purposes. Sit<>stand with RW and min A from WC and ambulated 26f x 1 and 546fx 1 with RW and min A. Pt demonstrated significant trunk flexion, decreased weight shifting L>R, and decreased LLE foot clearance. Pt reported feeling like her L knee was going to buckle and required 2nd person to [facility]. Worked on [employer] elevated mat with CGA and therpaist guarding L knee with emphasis on quad strengthening - cues for weight shfiting to L and for upright posture as pt flexing significantly at trunk. Pt required multiple rest breaks due to fatigue and weakness and reported that her LLE felt like "jello" after squats. Stand<>pivot mat<>TIS WC to R with mod A as pt unable to weight shift to lift LLE back towards chair and pivoting to sit too early. Pt reported urge to void and transferred to/from bedside commode with RW and mod A with max cues for upright posture, weight shifting, and stepping sequence. Pt required total A for clothing management and able to void minimally and perform  peri-care with supervision. Concluded session with pt semi-reclined in TIS WC, needs within reach, and seatbelt alarm on. NT present at bedside checking blood glucose levels.   Treatment Session 2 Re[facility]ing for her son to bring her lunch, pt agreeable to PT treatment, and denied any pain during session. Session with emphasis on functional mobility/transfers, generalized strengthening, standing tolerance, ambulation, and improved endurance with activity. Therapist brushed pt's hair per pt request and pt transferred sit<>stand with RW and light min A and pt ambulated 1174fith RW and min A +2 for WC follow and wound vac management. Pt required cues for upright posture/gaze and manual facilitation to maintain LUE in RW orthosis. Pt requested to return to bed and transferred TIS WC<>bed with RW and min A but sat too early when pivoting due to LLE feeling "weak" -verbal cues pt to turn completely around and sit slowly for safety. Pt's son arrived and pt requested to sit EOB and eat lunch. Concluded session with pt siting EOB, needs within reach, and bed alarm on. Pt's son present at bedside. 10 minutes missed of skilled physical therapy due to eating lunch.   Therapy Documentation Precautions:  Precautions Precautions: Fall, Sternal Restrictions Weight Bearing Restrictions: No Other Position/Activity Restrictions: sternal precautions  Therapy/Group: Individual Therapy AnnAlfonse Alpers, DPT   08/30/2021, 7:28 AM

## 2021-08-30 NOTE — Progress Notes (Signed)
Occupational Therapy Session Note  Patient Details  Name: KATRYNA TSCHIRHART MRN: [id number] Date of Birth: 03/29/1956  Today's Date: 08/30/2021 OT Individual Time: 1415-1445 OT Individual Time Calculation (min): 30 min    Short Term Goals: Week 1:  OT Short Term Goal 1 (Week 1): Pt will complete sit > stand with CGA while maintaining sternal precautions OT Short Term Goal 1 - Progress (Week 1): Met OT Short Term Goal 2 (Week 1): Pt will complete LB dressing with min assist while adhering ot sternal precautions OT Short Term Goal 2 - Progress (Week 1): Met OT Short Term Goal 3 (Week 1): Pt will complete toileting (clothing management and hygiene) with CGA while adhering to sternal precautions OT Short Term Goal 3 - Progress (Week 1): Met  Skilled Therapeutic Interventions/Progress Updates:    Pt received in bed with son present. Pt with no pain throughout session. MOD A for bed mobility to elevate trunk. Pt completes MIN A stand pivot transfer with RW and significantly improved stepping to turn to w/c with VC for weight shift to step foot. Pt requires MAX A for hair washing with hair washing tray and MIN A for drying. Pt able to brush hair with set up. Exited session with pt seated in bed, exit alarm on and call light in reach   Therapy Documentation Precautions:  Precautions Precautions: Fall, Sternal Restrictions Weight Bearing Restrictions: No Other Position/Activity Restrictions: sternal precautions  Other Treatments:     Therapy/Group: Individual Therapy  [name] 08/30/2021, 2:50 PM

## 2021-08-30 NOTE — Progress Notes (Signed)
2 Days Post-Op Procedure(s) (LRB): STERNAL WOUND DEBRIDEMENT (N/A) WOUND VAC CHANGE (N/A) Subjective: No complaints  Objective: Vital signs in last 24 hours: Temp:  [98 F (36.7 C)-99.2 F (37.3 C)] 98 F (36.7 C) (09/17 0359) Pulse Rate:  [74-86] 79 (09/17 0359) Resp:  [17-19] 18 (09/17 0359) BP: (126-168)/(63-82) 168/82 (09/17 0359) SpO2:  [95 %-96 %] 96 % (09/17 0359) Weight:  [89.5 kg] 89.5 kg (09/17 0359)  Hemodynamic parameters for last 24 hours:    Intake/Output from previous day: 09/16 0701 - 09/17 0700 In: 41 [P.O.:720; IV Piggyback:100] Out: 110 [Drains:110] Intake/Output this shift: Total I/O In: 472 [P.O.:472] Out: -   General appearance: alert, cooperative, and no distress VAC in place  Lab Results: No results for input(s): WBC, HGB, HCT, PLT in the last 72 hours. BMET: No results for input(s): NA, K, CL, CO2, GLUCOSE, BUN, CREATININE, CALCIUM in the last 72 hours.  PT/INR: No results for input(s): LABPROT, INR in the last 72 hours. ABG    Component Value Date/Time   PHART 7.346 (L) 08/05/2021 0105   HCO3 26.2 08/05/2021 0105   TCO2 28 08/05/2021 0105   ACIDBASEDEF 1.0 08/04/2021 2140   O2SAT 98.0 08/05/2021 0105   CBG (last 3)  Recent Labs    08/29/21 1623 08/29/21 2053 08/30/21 0615  GLUCAP 170* 119* 147*    Assessment/Plan: S/P Procedure(s) (LRB): STERNAL WOUND DEBRIDEMENT (N/A) WOUND VAC CHANGE (N/A) -  Doing well with wound VAC in place Tissue was healthy and granulating with no evidence of extension to sternum or into mediastinum when dressing changed Will need twice weekly VAC sponge changes Remains on Ancef   LOS: 19 days    [name] 08/30/2021

## 2021-08-30 NOTE — Progress Notes (Signed)
PROGRESS NOTE   Subjective/Complaints: Patient seen laying in bed this morning.  She states she slept well overnight.  She denies complaints.  She remembers me from her previous admission.  She was seen by CTS this a.m., notes reviewed-continue antibiotics.  ROS: Denies CP, SOB, N/V/D  Objective:   No results found. No results for input(s): WBC, HGB, HCT, PLT in the last 72 hours.   No results for input(s): NA, K, CL, CO2, GLUCOSE, BUN, CREATININE, CALCIUM in the last 72 hours.    Intake/Output Summary (Last 24 hours) at 08/30/2021 1051 Last data filed at 08/30/2021 0730 Gross per 24 hour  Intake 1292 ml  Output 110 ml  Net 1182 ml    Physical Exam: Vital Signs Blood pressure (!) 168/82, pulse 79, temperature 98 F (36.7 C), temperature source Oral, resp. rate 18, height '5\' 7"'$  (1.702 m), weight 89.5 kg, SpO2 96 %. Constitutional: No distress . Vital signs reviewed. HENT: Normocephalic.  Atraumatic. Eyes: EOMI. No discharge. Cardiovascular: No JVD.  RRR. Respiratory: Normal effort.  No stridor.  Bilateral clear to auscultation. GI: Non-distended.  BS +. Skin: Warm and dry.  Intact.  + Wound VAC to chest. Psych: Normal mood.  Normal behavior. Musc: No edema in extremities.  No tenderness in extremities. Neuro: Alert and oriented Motor: Residual left sided weakness from prior stroke, wrist drop, unchanged. 5/5 throughout but 0/5 left wrist extension, 3/5 left wrist hand grip.   Assessment/Plan: 1. Functional deficits which require 3+ hours per day of interdisciplinary therapy in a comprehensive inpatient rehab setting. Physiatrist is providing close team supervision and 24 hour management of active medical problems listed below. Physiatrist and rehab team continue to assess barriers to discharge/monitor patient progress toward functional and medical goals  Care Tool:  Bathing    Body parts bathed by patient: Left  arm, Abdomen, Face, Chest   Body parts bathed by helper: Right arm, Right upper leg, Left upper leg, Right lower leg, Left lower leg     Bathing assist Assist Level: Maximal Assistance - Patient 24 - 49%     Upper Body Dressing/Undressing Upper body dressing   What is the patient wearing?: Button up shirt    Upper body assist Assist Level: Total Assistance - Patient < 25%    Lower Body Dressing/Undressing Lower body dressing      What is the patient wearing?: Pants     Lower body assist Assist for lower body dressing: Total Assistance - Patient < 25%     Toileting Toileting    Toileting assist Assist for toileting: Total Assistance - Patient < 25%     Transfers Chair/bed transfer  Transfers assist     Chair/bed transfer assist level: Moderate Assistance - Patient 50 - 74%     Locomotion Ambulation   Ambulation assist      Assist level: 2 helpers Assistive device: Walker-rolling Max distance: 43f   Walk 10 feet activity   Assist     Assist level: 2 helpers Assistive device: Walker-rolling   Walk 50 feet activity   Assist Walk 50 feet with 2 turns activity did not occur: Safety/medical concerns (fatigue, LE weakness, decreased balance/postural control, deconditioning)  Assist level: Moderate Assistance - Patient - 50 - 74%      Walk 150 feet activity   Assist Walk 150 feet activity did not occur: Safety/medical concerns (fatigue, LE weakness, decreased balance/postural control, deconditioning)         Walk 10 feet on uneven surface  activity   Assist Walk 10 feet on uneven surfaces activity did not occur: Safety/medical concerns (fatigue, LE weakness, decreased balance/postural control, deconditioning)         Wheelchair     Assist Is the patient using a wheelchair?: Yes Type of Wheelchair: Manual Wheelchair activity did not occur: Safety/medical concerns (fatigue, LE weakness, decreased balance/postural control,  deconditioning)         Wheelchair 50 feet with 2 turns activity    Assist    Wheelchair 50 feet with 2 turns activity did not occur: Safety/medical concerns (fatigue, LE weakness, decreased balance/postural control, deconditioning)       Wheelchair 150 feet activity     Assist  Wheelchair 150 feet activity did not occur: Safety/medical concerns (fatigue, LE weakness, decreased balance/postural control, deconditioning)       Blood pressure (!) 168/82, pulse 79, temperature 98 F (36.7 C), temperature source Oral, resp. rate 18, height '5\' 7"'$  (1.702 m), weight 89.5 kg, SpO2 96 %.  Medical Problem List and Plan: 1.  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  Status post median sternotomy resection of fibroelastoma from aortic valve/CABG x4 08/04/2021.  Sternal precautions  Continue CIR 2.  Impaired mobility, ambulation decreased from 40 to 1 feet following sternal infection/associated fatigue, encouraged to resume ambulation tomorrow with therapy -DVT/anticoagulation:  Pharmaceutical: continue Lovenox             -antiplatelet therapy: Continue Aspirin 325 mg daily 3. Post-stroke left hand pain: Robaxin and tramadol as needed. Added B6 '50mg'$  daily to improve sensitivity to pain. Decreased Gabapentin to '100mg'$  BID and '600mg'$  HS  Controlled on 9/17 4. Depression/Anxiety Continue Zoloft 100 mg daily, decrease xanax to daily PRN             -antipsychotic agents: N/A 5. Neuropsych: This patient is capable of making decisions on her own behalf. 6. Chest incision: Healing well, continue Routine skin checks 7. Fluids/Electrolytes/Nutrition: Routine in and outs 8.ABLA. monitor weekly.   Hemoglobin 8.8 on 9/14, continue to monitor 9.  Diabetes mellitus with peripheral neuropathy.  Hemoglobin A1c 7.8.  DiaBeta 5 mg daily, Glucophage 1000 mg twice daily.   Mildly elevated 9/17, continue to monitor 10  Hypertension.   Lasix 40 mg daily , hydralazine 10 mg  nightly, Cozaar 100 mg daily, Lopressor 12.5 mg twice daily DC'd Continue Norvasc 10 Blood pressure medications being held-labile on 9/17, monitor for trend 11.  GERD.  Protonix 12.  Restless leg syndrome.  Requip nightly--HELD dt lethargy 13.  Hyperlipidemia.  Lipitor 14.  Obesity.  BMI 30.83.  Dietary follow-up 15.  History of right thalamic infarction 5/22.  Received CIR.  Continue aspirin. 16. Left wrist drop: splint ordered 17. Vitamin D deficiency: start ergocalciferol 50,000U once per week  18. Suboptimal magnesium: started '250mg'$  magnesium gluconate HS. Conitnue magnesium oxide '400mg'$  BID  19. Dry mouth: stop Cymbalta. Improved 20. Dizziness/weakness: UA normal. CT ordered given significant decline and risk factors for stroke- negative.  21. Leukocytosis: WBC up again 9/13, appreciate CT surgery following,  -s/p vac placement for sternal infection.  -Hemoglobin 8.8 on 9/14 -continue cefepime 22. Hyponatremia- lasix stopped 9/9    Na+ 135 on  9/14  -may d/c fluids 23. AKI: Resolved   -K+ 3.5 on 9/14 after Kayexalate, labs ordered for Monday  -see above  -normalized 14. Urinary retention: counseled regarding importance of movement, preventing constipation.  Started bethanacol '5mg'$  TID.  15. Left hand swelling: continue to elevate, asked RN to apply ice.  16. Postsurgical chest pain: change Tylenol to Tylenol #3 q8H prn- improved 17. Cracked lips: blistex PRN ordered.  18. Constipation: continue dulcolax, added colace '50mg'$  BID  LOS: 19 days A FACE TO FACE EVALUATION WAS PERFORMED  Dareth Andrew Lorie Phenix 08/30/2021, 10:51 AM

## 2021-08-31 DIAGNOSIS — R229 Localized swelling, mass and lump, unspecified: Secondary | ICD-10-CM

## 2021-08-31 DIAGNOSIS — E875 Hyperkalemia: Secondary | ICD-10-CM

## 2021-08-31 LAB — GLUCOSE, CAPILLARY
Glucose-Capillary: 165 mg/dL — ABNORMAL HIGH (ref 70–99)
Glucose-Capillary: 162 mg/dL — ABNORMAL HIGH (ref 70–99)
Glucose-Capillary: 184 mg/dL — ABNORMAL HIGH (ref 70–99)
Glucose-Capillary: 188 mg/dL — ABNORMAL HIGH (ref 70–99)

## 2021-08-31 MED ORDER — LOSARTAN POTASSIUM 50 MG PO TABS
50.0000 mg | ORAL_TABLET | Freq: Every day | ORAL | Status: DC
  Administered 2021-08-31 – 2021-09-12 (×13): 50 mg via ORAL
  Filled 2021-08-31 (×13): qty 1

## 2021-08-31 MED ORDER — METFORMIN HCL 500 MG PO TABS
500.0000 mg | ORAL_TABLET | Freq: Two times a day (BID) | ORAL | Status: DC
  Administered 2021-08-31 – 2021-09-12 (×24): 500 mg via ORAL
  Filled 2021-08-31 (×24): qty 1

## 2021-08-31 NOTE — Progress Notes (Signed)
PROGRESS NOTE   Subjective/Complaints: Patient seen laying in bed this morning.  She states she slept well overnight.  She has questions regarding nodule on her right inner thigh.  ROS: Denies CP, SOB, N/V/D  Objective:   No results found. No results for input(s): WBC, HGB, HCT, PLT in the last 72 hours.   No results for input(s): NA, K, CL, CO2, GLUCOSE, BUN, CREATININE, CALCIUM in the last 72 hours.    Intake/Output Summary (Last 24 hours) at 08/31/2021 0814 Last data filed at 08/31/2021 0800 Gross per 24 hour  Intake 581.72 ml  Output --  Net 581.72 ml    Physical Exam: Vital Signs Blood pressure (!) 159/75, pulse 87, temperature 98.6 F (37 C), temperature source Oral, resp. rate 17, height '5\' 7"'$  (1.702 m), weight 93.3 kg, SpO2 93 %. Constitutional: No distress . Vital signs reviewed. HENT: Normocephalic.  Atraumatic. Eyes: EOMI. No discharge. Cardiovascular: No JVD.  RRR. Respiratory: Normal effort.  No stridor.  Bilateral clear to auscultation. GI: Non-distended.  BS +. Skin: Warm and dry.  + Wound VAC to chest. Psych: Normal mood.  Normal behavior. Musc: No edema in extremities.  No tenderness in extremities. Relatively large, slightly firm, nontender nodule on right inner thigh Neuro: Alert and oriented Motor: Residual left sided hemiparesis from prior stroke, wrist drop, unchanged. 5/5 throughout but 0/5 left wrist extension, 3/5 left wrist hand grip.   Assessment/Plan: 1. Functional deficits which require 3+ hours per day of interdisciplinary therapy in a comprehensive inpatient rehab setting. Physiatrist is providing close team supervision and 24 hour management of active medical problems listed below. Physiatrist and rehab team continue to assess barriers to discharge/monitor patient progress toward functional and medical goals  Care Tool:  Bathing    Body parts bathed by patient: Left arm,  Abdomen, Face, Chest   Body parts bathed by helper: Right arm, Right upper leg, Left upper leg, Right lower leg, Left lower leg     Bathing assist Assist Level: Maximal Assistance - Patient 24 - 49%     Upper Body Dressing/Undressing Upper body dressing   What is the patient wearing?: Button up shirt    Upper body assist Assist Level: Total Assistance - Patient < 25%    Lower Body Dressing/Undressing Lower body dressing      What is the patient wearing?: Pants     Lower body assist Assist for lower body dressing: Total Assistance - Patient < 25%     Toileting Toileting    Toileting assist Assist for toileting: Total Assistance - Patient < 25%     Transfers Chair/bed transfer  Transfers assist     Chair/bed transfer assist level: Moderate Assistance - Patient 50 - 74%     Locomotion Ambulation   Ambulation assist      Assist level: Minimal Assistance - Patient > 75% Assistive device: Walker-rolling Max distance: 36f   Walk 10 feet activity   Assist     Assist level: 2 helpers Assistive device: Walker-rolling   Walk 50 feet activity   Assist Walk 50 feet with 2 turns activity did not occur: Safety/medical concerns (fatigue, LE weakness, decreased balance/postural control, deconditioning)  Assist level: Moderate Assistance - Patient - 50 - 74%      Walk 150 feet activity   Assist Walk 150 feet activity did not occur: Safety/medical concerns (fatigue, LE weakness, decreased balance/postural control, deconditioning)         Walk 10 feet on uneven surface  activity   Assist Walk 10 feet on uneven surfaces activity did not occur: Safety/medical concerns (fatigue, LE weakness, decreased balance/postural control, deconditioning)         Wheelchair     Assist Is the patient using a wheelchair?: Yes Type of Wheelchair: Manual Wheelchair activity did not occur: Safety/medical concerns (fatigue, LE weakness, decreased balance/postural  control, deconditioning)         Wheelchair 50 feet with 2 turns activity    Assist    Wheelchair 50 feet with 2 turns activity did not occur: Safety/medical concerns (fatigue, LE weakness, decreased balance/postural control, deconditioning)       Wheelchair 150 feet activity     Assist  Wheelchair 150 feet activity did not occur: Safety/medical concerns (fatigue, LE weakness, decreased balance/postural control, deconditioning)       Blood pressure (!) 159/75, pulse 87, temperature 98.6 F (37 C), temperature source Oral, resp. rate 17, height '5\' 7"'$  (1.702 m), weight 93.3 kg, SpO2 93 %.  Medical Problem List and Plan: 1.  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  Status post median sternotomy resection of fibroelastoma from aortic valve/CABG x4 08/04/2021.  Sternal precautions  Continue CIR 2.  Impaired mobility, ambulation decreased from 40 to 1 feet following sternal infection/associated fatigue, encouraged to resume ambulation tomorrow with therapy -DVT/anticoagulation:  Pharmaceutical: continue Lovenox             -antiplatelet therapy: Continue Aspirin 325 mg daily 3. Post-stroke left hand pain: Robaxin and tramadol as needed. Added B6 '50mg'$  daily to improve sensitivity to pain. Decreased Gabapentin to '100mg'$  BID and '600mg'$  HS  Controlled on 9/18 4. Depression/Anxiety Continue Zoloft 100 mg daily, decrease xanax to daily PRN             -antipsychotic agents: N/A 5. Neuropsych: This patient is capable of making decisions on her own behalf. 6. Chest incision: Healing well, continue Routine skin checks 7. Fluids/Electrolytes/Nutrition: Routine in and outs 8.ABLA. monitor weekly.   Hemoglobin 8.8 on 9/14, continue to monitor 9.  Diabetes mellitus with peripheral neuropathy.  Hemoglobin A1c 7.8.  DiaBeta 5 mg daily, Glucophage 1000 mg twice daily PTA.   Glucophage 500 twice daily started on 9/18 10  Hypertension.   Lasix 40 mg daily ,  hydralazine 10 mg nightly, Cozaar 100 mg daily, Lopressor 12.5 mg twice daily DC'd Continue Norvasc 10 Cozaar 50 restarted on 9/18 11.  GERD.  Protonix 12.  Restless leg syndrome.  Requip nightly--HELD dt lethargy 13.  Hyperlipidemia.  Lipitor 14.  Obesity.  BMI 30.83.  Dietary follow-up 15.  History of right thalamic infarction 5/22.  Received CIR.  Continue aspirin. 16. Left wrist drop: splint ordered 17. Vitamin D deficiency: start ergocalciferol 50,000U once per week  18. Suboptimal magnesium: started '250mg'$  magnesium gluconate HS. Conitnue magnesium oxide '400mg'$  BID  19. Dry mouth: stop Cymbalta. Improved 20. Dizziness/weakness: UA normal. CT ordered given significant decline and risk factors for stroke- negative.  21. Leukocytosis: WBC up again 9/13, appreciate CT surgery following,  -s/p vac placement for sternal infection.  -Hemoglobin 8.8 on 9/14 -continue cefepime 22. Hyponatremia- lasix stopped 9/9    Na+ 135 on 9/14  -may  d/c fluids 23. AKI: Resolved   -K+ 3.5 on 9/14 after Kayexalate, labs ordered for tomorrow  -see above  -normalized 14. Urinary retention: counseled regarding importance of movement, preventing constipation.  Started bethanacol '5mg'$  TID.  15. Left hand swelling: continue to elevate, asked RN to apply ice.  16. Postsurgical chest pain: change Tylenol to Tylenol #3 q8H prn- improved 17. Cracked lips: blistex PRN ordered.  18. Constipation: continue dulcolax, added colace '50mg'$  BID 19. Right inner thigh subcutaneous nodule Patient states she noticed this a few days ago, nontender, not enlarging.  She also states she had vein graft around that area Try warm compress, will consider further work-up if necessary  LOS:  20 days A FACE TO FACE EVALUATION WAS PERFORMED  [name] Lorie Phenix 08/31/2021, 8:14 AM

## 2021-09-01 ENCOUNTER — Other Ambulatory Visit: Payer: Self-pay | Admitting: Thoracic Surgery (Cardiothoracic Vascular Surgery)

## 2021-09-01 DIAGNOSIS — K5901 Slow transit constipation: Secondary | ICD-10-CM

## 2021-09-01 DIAGNOSIS — Z951 Presence of aortocoronary bypass graft: Secondary | ICD-10-CM

## 2021-09-01 LAB — GLUCOSE, CAPILLARY
Glucose-Capillary: 152 mg/dL — ABNORMAL HIGH (ref 70–99)
Glucose-Capillary: 150 mg/dL — ABNORMAL HIGH (ref 70–99)
Glucose-Capillary: 153 mg/dL — ABNORMAL HIGH (ref 70–99)
Glucose-Capillary: 136 mg/dL — ABNORMAL HIGH (ref 70–99)

## 2021-09-01 LAB — BASIC METABOLIC PANEL
Anion gap: 12 (ref 5–15)
BUN: <5 mg/dL — ABNORMAL LOW (ref 8–23)
CO2: 28 mmol/L (ref 22–32)
Calcium: 9.1 mg/dL (ref 8.9–10.3)
Chloride: 95 mmol/L — ABNORMAL LOW (ref 98–111)
Creatinine, Ser: 0.87 mg/dL (ref 0.44–1.00)
GFR, Estimated: >60 mL/min (ref >60)
Glucose, Bld: 148 mg/dL — ABNORMAL HIGH (ref 70–99)
Potassium: 4 mmol/L (ref 3.5–5.1)
Sodium: 135 mmol/L (ref 135–145)

## 2021-09-01 MED ORDER — FENTANYL CITRATE PF 50 MCG/ML IJ SOSY
25.0000 ug | PREFILLED_SYRINGE | Freq: Once | INTRAMUSCULAR | Status: AC
  Administered 2021-09-01: 25 ug via INTRAVENOUS
  Filled 2021-09-01: qty 1

## 2021-09-01 MED ORDER — SORBITOL 70 % SOLN
60.0000 mL | Status: AC
  Administered 2021-09-01: 60 mL via ORAL
  Filled 2021-09-01: qty 60

## 2021-09-01 NOTE — Progress Notes (Addendum)
      [address]       [place] [address]             [phone]      4 Days Post-Op Procedure(s) (LRB): STERNAL WOUND DEBRIDEMENT (N/A) WOUND VAC CHANGE (N/A) Subjective: Observed VAC change  Objective: Vital signs in last 24 hours: Temp:  [98.4 F (36.9 C)-98.5 F (36.9 C)] 98.4 F (36.9 C) (09/18 1939) Pulse Rate:  [75-89] 75 (09/19 0614) Resp:  [16-18] 16 (09/19 0614) BP: (151-158)/(75-77) 151/77 (09/19 0614) SpO2:  [94 %-96 %] 96 % (09/19 0614) Weight:  [93.4 kg] 93.4 kg (09/19 0600)  Hemodynamic parameters for last 24 hours:    Intake/Output from previous day: 09/18 0701 - 09/19 0700 In: 472 [P.O.:472] Out: -  Intake/Output this shift: Total I/O In: 236 [P.O.:236] Out: -   Wound: excellent granulation tissue, healing well, deeper portion eill take longer to heal       Lab Results: No results for input(s): WBC, HGB, HCT, PLT in the last 72 hours. BMET: No results for input(s): NA, K, CL, CO2, GLUCOSE, BUN, CREATININE, CALCIUM in the last 72 hours.  PT/INR: No results for input(s): LABPROT, INR in the last 72 hours. ABG    Component Value Date/Time   PHART 7.346 (L) 08/05/2021 0105   HCO3 26.2 08/05/2021 0105   TCO2 28 08/05/2021 0105   ACIDBASEDEF 1.0 08/04/2021 2140   O2SAT 98.0 08/05/2021 0105   CBG (last 3)  Recent Labs    08/31/21 1621 08/31/21 2141 09/01/21 0617  GLUCAP 184* 188* 152*    Meds Scheduled Meds:  amLODipine  10 mg Oral Daily   aspirin EC  325 mg Oral Daily   atorvastatin  40 mg Oral Daily   bethanechol  5 mg Oral BID   bisacodyl  10 mg Oral Daily   Or   bisacodyl  10 mg Rectal Daily   docusate sodium  50 mg Oral BID   enoxaparin (LOVENOX) injection  40 mg Subcutaneous QHS   gabapentin  100 mg Oral BID   gabapentin  600 mg Oral QHS   insulin aspart  0-15 Units Subcutaneous TID WC   lidocaine  1 patch Transdermal Q24H   losartan  50 mg Oral Daily   magnesium oxide  400 mg Oral BID   metFORMIN   500 mg Oral BID WC   multivitamin with minerals  1 tablet Oral Daily   pantoprazole  40 mg Oral QHS   polyethylene glycol  17 g Oral BID   vitamin B-6  50 mg Oral Daily   sertraline  100 mg Oral Daily   Vitamin D (Ergocalciferol)  50,000 Units Oral Q7 days   Continuous Infusions:   ceFAZolin (ANCEF) IV 2 g (09/01/21 0613)   PRN Meds:.acetaminophen, acetaminophen-codeine, ALPRAZolam, lidocaine, lip balm, magnesium hydroxide, methocarbamol, ondansetron, traMADol  Xrays No results found.  Assessment/Plan: S/P Procedure(s) (LRB): STERNAL WOUND DEBRIDEMENT (N/A) WOUND VAC CHANGE (N/A)  1 afebrile 2 conts VAC 3 no new labs 4 remains on ancef for MSSA  LOS: 21 days    [name] 09/01/2021   Wound progressing well Continue with VAC  Remo Lipps C. Roxan Hockey, MD [facility] [phone]

## 2021-09-01 NOTE — Progress Notes (Signed)
Physical Therapy Session Note  Patient Details  Name: [name] MRN: [id number] Date of Birth: 08-17-1956  Today's Date: 09/01/2021 PT Individual Time: 0801-0859 PT Individual Time Calculation (min): 58 min   Short Term Goals: Week 1:  PT Short Term Goal 1 (Week 1): pt will perform bed mobility with min A consistantly PT Short Term Goal 1 - Progress (Week 1): Met PT Short Term Goal 2 (Week 1): pt will transfer bed<>chair with LRAD and min A PT Short Term Goal 2 - Progress (Week 1): Met PT Short Term Goal 3 (Week 1): pt will ambulate 22f with LRAD and min A PT Short Term Goal 3 - Progress (Week 1): Met Week 2:  PT Short Term Goal 1 (Week 2): STG=LTG due to LOS Week 3:  PT Short Term Goal 1 (Week 3): STG=LTG due to extended LOS due to medical complications  Skilled Therapeutic Interventions/Progress Updates:  Pt received supine in bed, L wrist splint donned, reported 3/10 pain in sternum and was premedicated. Offered positional changes for pain relief throughout session. Pt donned pants w/max A, mod A for rolling L & R to adhere to sternal precautions. Pt reported "squealing" noises from wound vac on sternum, nursing notified and present to examine pt, suspect wound vac is leaking, extra precautions taken during session. Supine <>sit EOB w/min A for trunk support and pt performed sit <>stand from elevated EOB to RW w/CGA. Stand pivot to TIS w/RW and uncontrolled stand <>sit w/ CGA. Educated pt on controlled lowering to sit, pt verbalized understanding.Transported pt to 5N hallway w/total A 2/2 sternal precautions. Sit <>stand from TIS x2 attempts w/min A for retropulsion correction. Pt ambulated 15' w/RW and CGA, noted decreased step length/clearance of LLE >RLE, kyphotic posture, narrow BOS and decreased cadence. Verbal cues for increased step length of BLEs, upright posture and maintaining of safe distance to RW. Stand <>sit w/CGA, noted improved controlled lower. Pt transported back to  room w/total A 2/2 sternal precautions and was left seated in TIS in room, Dr. SNaaman Plummerpresent for rounds and notified of wound vac, all needs in reach.   Therapy Documentation Precautions:  Precautions Precautions: Fall, Sternal Restrictions Weight Bearing Restrictions: No Other Position/Activity Restrictions: sternal precautions   Therapy/Group: Individual Therapy JCruzita LedererPlaster, PT, DPT  09/01/2021, 7:41 AM

## 2021-09-01 NOTE — Progress Notes (Signed)
Occupational Therapy Session Note  Patient Details  Name: JACOBY RITSEMA MRN: [id number] Date of Birth: 1956-03-16  Today's Date: 09/01/2021 OT Individual Time: 8676-7209 OT Individual Time Calculation (min): 78 min    Short Term Goals: Week 3:  OT Short Term Goal 1 (Week 3): STG = LTGs due to ELOS  Skilled Therapeutic Interventions/Progress Updates:    Treatment session with focus on self-care retraining, sit > stand, and functional transfers. Pt received upright in TIS w/c agreeable to therapy session.  Pt expressing desire to wash up and change clothes.  Therapist educated on hemi-dressing technique with pt able to doff shirt post cues and demonstrating improved functional use of LUE during bathing this session.  Pt required setup assist when donning shirt.  Pt completed sit > stand for LB bathing/dressing with min fading to mod assist with fatigue.  Pt requiring physical assistance with threading RLE and pulling pants over hips due to decreased reach and standing balance.  Pt completed oral care with setup for items, noted to drop items frequently when attempting to incorporate LUE into tasks.  Pt reports need to toilet.  Pt ambulated 8' to toilet with RW min fading to mod assist due to decreased stepping with LLE when turning to sit on toilet.  Pt required mod-max assist sit > stand from [facility] over toilet due to fatigue and lower seat surface.  Pt ambulated 5' back to w/c with mod fading to max assist again with increased difficulty advancing LLE and requiring physical assistance to ensure pt sitting to w/c as pt attempting to sit prematurely.  Pt remained upright in TIS w/c with all needs in reach and seat belt alarm on.  Therapy Documentation Precautions:  Precautions Precautions: Fall, Sternal Restrictions Weight Bearing Restrictions: No Other Position/Activity Restrictions: sternal precautions Pain: Pain Assessment Pain Scale: 0-10 Pain Score: 2 Pain Type: Surgical pain Pain  Location: Sternum Pain Orientation: Mid Pain Descriptors / Indicators: Aching Pain Frequency: Intermittent Pain Onset: With Activity Pain Intervention(s): Premedicated   Therapy/Group: Individual Therapy  Simonne Come 09/01/2021, 11:46 AM

## 2021-09-01 NOTE — Progress Notes (Signed)
PROGRESS NOTE   Subjective/Complaints: C/o constipation although had lumpy stool after I left her in am. Would like more to drink, feels that unit is "understocked"  ROS: Patient denies fever, rash, sore throat, blurred vision, nausea, vomiting, diarrhea, cough, shortness of breath or chest pain, joint or back pain, headache, or mood change.   Objective:   No results found. No results for input(s): WBC, HGB, HCT, PLT in the last 72 hours.   No results for input(s): NA, K, CL, CO2, GLUCOSE, BUN, CREATININE, CALCIUM in the last 72 hours.    Intake/Output Summary (Last 24 hours) at 09/01/2021 1205 Last data filed at 09/01/2021 0738 Gross per 24 hour  Intake 472 ml  Output --  Net 472 ml   Physical Exam: Vital Signs Blood pressure (!) 151/77, pulse 75, temperature 98.4 F (36.9 C), temperature source Oral, resp. rate 16, height '5\' 7"'$  (1.702 m), weight 93.4 kg, SpO2 96 %. Constitutional: No distress . Vital signs reviewed. HEENT: NCAT, EOMI, oral membranes moist Neck: supple Cardiovascular: RRR without murmur. No JVD    Respiratory/Chest: CTA Bilaterally without wheezes or rales. Normal effort    GI/Abdomen: BS +, non-tender, non-distended Ext: no clubbing, cyanosis, tr edema Psych: pleasant and cooperative  Skin: vac to chest with s/s drainage (scant) Musc: No edema in extremities.  No tenderness in extremities. Relatively large, slightly firm, nontender nodule on right inner thigh Neuro: Alert and oriented Motor: Residual left sided hemiparesis from prior stroke, wrist drop, no changes. 5/5 throughout but 0/5 left wrist extension, 3/5 left wrist hand grip.   Assessment/Plan: 1. Functional deficits which require 3+ hours per day of interdisciplinary therapy in a comprehensive inpatient rehab setting. Physiatrist is providing close team supervision and 24 hour management of active medical problems listed  below. Physiatrist and rehab team continue to assess barriers to discharge/monitor patient progress toward functional and medical goals  Care Tool:  Bathing    Body parts bathed by patient: Right arm, Left arm, Chest, Abdomen, Front perineal area, Buttocks, Right upper leg, Left upper leg, Face   Body parts bathed by helper: Right lower leg, Left lower leg     Bathing assist Assist Level: Moderate Assistance - Patient 50 - 74%     Upper Body Dressing/Undressing Upper body dressing   What is the patient wearing?: Button up shirt    Upper body assist Assist Level: Minimal Assistance - Patient > 75%    Lower Body Dressing/Undressing Lower body dressing      What is the patient wearing?: Pants     Lower body assist Assist for lower body dressing: Moderate Assistance - Patient 50 - 74%     Toileting Toileting    Toileting assist Assist for toileting: Total Assistance - Patient < 25%     Transfers Chair/bed transfer  Transfers assist     Chair/bed transfer assist level: Moderate Assistance - Patient 50 - 74%     Locomotion Ambulation   Ambulation assist      Assist level: Minimal Assistance - Patient > 75% Assistive device: Walker-rolling Max distance: 3f   Walk 10 feet activity   Assist     Assist level: 2  helpers Assistive device: Walker-rolling   Walk 50 feet activity   Assist Walk 50 feet with 2 turns activity did not occur: Safety/medical concerns (fatigue, LE weakness, decreased balance/postural control, deconditioning)  Assist level: Moderate Assistance - Patient - 50 - 74%      Walk 150 feet activity   Assist Walk 150 feet activity did not occur: Safety/medical concerns (fatigue, LE weakness, decreased balance/postural control, deconditioning)         Walk 10 feet on uneven surface  activity   Assist Walk 10 feet on uneven surfaces activity did not occur: Safety/medical concerns (fatigue, LE weakness, decreased  balance/postural control, deconditioning)         Wheelchair     Assist Is the patient using a wheelchair?: Yes Type of Wheelchair: Manual Wheelchair activity did not occur: Safety/medical concerns (fatigue, LE weakness, decreased balance/postural control, deconditioning)         Wheelchair 50 feet with 2 turns activity    Assist    Wheelchair 50 feet with 2 turns activity did not occur: Safety/medical concerns (fatigue, LE weakness, decreased balance/postural control, deconditioning)       Wheelchair 150 feet activity     Assist  Wheelchair 150 feet activity did not occur: Safety/medical concerns (fatigue, LE weakness, decreased balance/postural control, deconditioning)       Blood pressure (!) 151/77, pulse 75, temperature 98.4 F (36.9 C), temperature source Oral, resp. rate 16, height '5\' 7"'$  (1.702 m), weight 93.4 kg, SpO2 96 %.  Medical Problem List and Plan: 1.  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  Status post median sternotomy resection of fibroelastoma from aortic valve/CABG x4 08/04/2021.  Sternal precautions  -Continue CIR therapies including PT, OT  2.  Impaired mobility, ambulation decreased from 40 to 1 feet following sternal infection/associated fatigue, encouraged to resume ambulation tomorrow with therapy -DVT/anticoagulation:  Pharmaceutical: continue Lovenox             -antiplatelet therapy: Continue Aspirin 325 mg daily 3. Post-stroke left hand pain: Robaxin and tramadol as needed. Added B6 '50mg'$  daily to improve sensitivity to pain. Decreased Gabapentin to '100mg'$  BID and '600mg'$  HS  Controlled on 9/19 4. Depression/Anxiety Continue Zoloft 100 mg daily, decrease xanax to daily PRN             -antipsychotic agents: N/A 5. Neuropsych: This patient is capable of making decisions on her own behalf. 6. Chest incision: Healing well, continue Routine skin checks 7. Fluids/Electrolytes/Nutrition: Routine in and  outs 8.ABLA. monitor weekly.   Hemoglobin 8.8 on 9/14, follow up this week 9.  Diabetes mellitus with peripheral neuropathy.  Hemoglobin A1c 7.8.  DiaBeta 5 mg daily, Glucophage 1000 mg twice daily PTA.   Glucophage 500 twice daily started on 9/18--observe for effect 9/19 10  Hypertension.   Lasix 40 mg daily , hydralazine 10 mg nightly, Cozaar 100 mg daily, Lopressor 12.5 mg twice daily DC'd Continue Norvasc 10 Cozaar 50 restarted on 9/18--observe 9/19 11.  GERD.  Protonix 12.  Restless leg syndrome.  Requip nightly--HELD dt lethargy 13.  Hyperlipidemia.  Lipitor 14.  Obesity.  BMI 30.83.  Dietary follow-up 15.  History of right thalamic infarction 5/22.  Received CIR.  Continue aspirin. 16. Left wrist drop: splint ordered 17. Vitamin D deficiency: start ergocalciferol 50,000U once per week  18. Suboptimal magnesium: started '250mg'$  magnesium gluconate HS. Conitnue magnesium oxide '400mg'$  BID  19. Dry mouth: stop Cymbalta. Improved 20. Dizziness/weakness: UA normal. CT ordered given significant decline and risk  factors for stroke- negative.  21. Leukocytosis: WBC up again 9/13, appreciate CT surgery following,  -s/p vac placement for sternal infection.  -Hemoglobin 8.8 on 9/14 -continue cefepime 22. Hyponatremia- lasix stopped 9/9    Na+ 135 on 9/14  -off of IVF 23. AKI: Resolved   -K+ 3.5 on 9/14 after Kayexalate, labs ordered for tomorrow  -see above  -normalized 14. Urinary retention: counseled regarding importance of movement, preventing constipation.  Started bethanacol '5mg'$  TID.  15. Left hand swelling: continue to elevate, asked RN to apply ice.  16. Postsurgical chest pain: change Tylenol to Tylenol #3 q8H prn- improved 17. Cracked lips: blistex PRN ordered.  18. Constipation: continue dulcolax, added colace '50mg'$  BID 19. Right inner thigh subcutaneous nodules Patient states she noticed this a few days ago, nontender, not enlarging.  Suspect that these are small  hematomas -discussed warm compresses, observation 20. Slow transit constipation: small bm this morning  -sorbitol today  LOS:  21 days A FACE TO FACE EVALUATION WAS PERFORMED  Meredith Staggers 09/01/2021, 12:05 PM

## 2021-09-01 NOTE — Consult Note (Addendum)
[facility] Nurse Consult Note: Patient receiving care in [place], PA-C present for dressing change.  Reason for Consult:Wound vac dressing change Wound type: Surgical  Pressure Injury POA: NA Measurement: 10.5 cm x 3.7 cm x 1 cm with a tunneled wound in the inferior aspect measuring 2.3 cm in depth Wound bed: Beefy red, friable Drainage (amount, consistency, odor) Bloody in canister Periwound: pink border around the wound Dressing procedure/placement/frequency: Two pieces of black foam removed. Inferior piece of foam saturated with NS and tweezers used to slowly remove this piece of foam. One small piece of black foam used to place into the inferior part of the wound and one piece placed over the wound. Drape applied, immediate suction obtained at 125 mmHg. This dressing is to be changed twice weekly on M and Th. WOC will follow. Additional supplies at the bedside.  Will follow on M/Th for dressing changes. Please re-consult the [place] team if needed.  Cathlean Marseilles Tamala Julian, MSN, RN, [place], Lysle Pearl, [facility] Wound Treatment Associate Pager [phone]

## 2021-09-01 NOTE — Progress Notes (Signed)
Physical Therapy Session Note  Patient Details  Name: TAQUITA DEMBY MRN: [id number] Date of Birth: Feb 07, 1956  Today's Date: 09/01/2021 PT Individual Time: 6384-5364 PT Individual Time Calculation (min): 30 min   Short Term Goals: Week 3:  PT Short Term Goal 1 (Week 3): STG=LTG due to extended LOS due to medical complications  Skilled Therapeutic Interventions/Progress Updates:  Patient seated upright in TIS w/c on entrance to room. Patient awake, alert, and agreeable to PT session.   Patient with low level pain complaint throughout session. But is more concerned re: upcoming pain with wound dressing/ sponge change with wound care team coming to change wound vac dressing after this therapy session.   Therapeutic Activity/ Neuromuscular Re-ed: Transfers: Patient performed sit<>stand transfers throughout session with CGA/ supervision. Noted bias toward R side. NMR facilitated during session with focus on muscle activation of LLE, midline orientation/ proprioception. Pt guided in sit <> stand x5 with focus on improving lean toward L side in order to increase activation of L side and improving pt's confidence in LLE's strength and performance. Pt given guard to L knee throughout for potential buckle but no buckle noted. Pt requires vc for lean to L with each performance. Improved performance throughout with CGA. NMR performed for improvements in motor control and coordination, balance, sequencing, judgement, and self confidence/ efficacy in performing all aspects of mobility at highest level of independence.   Stand pivot using RW with CGA and vc for LLE step sequence to bed.   Patient supine  in bed at end of session with brakes locked, bed alarm set, and all needs within reach. Ready for wound care team arrival.      Therapy Documentation Precautions:  Precautions Precautions: Fall, Sternal Restrictions Weight Bearing Restrictions: No Other Position/Activity Restrictions: sternal  precautions General:   Vital Signs: Therapy Vitals Pulse Rate: 75 Resp: 16 BP: (!) 151/77 Patient Position (if appropriate): Lying Oxygen Therapy SpO2: 96 % O2 Device: Room Air Pain: Pain Assessment Pain Scale: 0-10 Pain Score: 4  Pain Type: Surgical pain Pain Location: Sternum Pain Orientation: Mid Pain Descriptors / Indicators: Aching Pain Frequency: Intermittent Pain Onset: With Activity Pain Intervention(s): Repositioning  Therapy/Group: Individual Therapy  [name] PT, DPT 09/01/2021, 8:28 AM

## 2021-09-01 NOTE — Progress Notes (Signed)
Occupational Therapy Session Note  Patient Details  Name: [name] MRN: [id number] Date of Birth: April 06, 1956  Today's Date: 09/01/2021 OT Individual Time: [id number] and [id number] OT Individual Time Calculation (min): 40 min and 18 min   Short Term Goals: Week 3:  OT Short Term Goal 1 (Week 3): STG = LTGs due to ELOS  Skilled Therapeutic Interventions/Progress Updates:    1) Treatment session with focus on functional mobility and sit > stand transfers.  Pt received supine in bed reporting fatigue from previous therapy sessions and strong pain meds in preparation for wound vac change.  Pt agreeable to attempt therapy in room due to fatigue.  Pt completed bed mobility to L side of bed with CGA and increased time and min cues for sternal precautions.  Engaged in sit > stand x4 from elevated EOB slowly decreasing height of bed.  Pt initially requiring CGA fading to min assist during sit > stand as bed height lowered.  Pt reports feeling "woozy", BP assessed see flowsheets.  Pt completed final sit > stand and completed side stepping prior to returning to supine.  Pt able to return to supine with increased time, but no physical assistance.  Pt requested therapist return later to finish final amount of scheduled session.    2) Treatment session with focus on bed mobility and stand pivot transfers.  Pt received supine in bed reporting need to toilet.  Pt completed bed mobility to R side of bed with CGA.  Pt completed sit > stand from elevated EOB with RW with CGA.  Pt completed stand pivot transfer to [facility] with RW with Min assist.  Pt required mod assist sit > stand from lower surface of BSC and required physical assistance for hygiene and clothing management.  Therapist increased height of BSC for future toileting needs.  Pt transferred back to bed stand pivot with RW with min assist and returned to supine CGA.  Pt remained semi-reclined in bed with all needs in reach.  Therapy  Documentation Precautions:  Precautions Precautions: Fall, Sternal Restrictions Weight Bearing Restrictions: No Other Position/Activity Restrictions: sternal precautions General:   Vital Signs: Therapy Vitals Temp: 98.1 F (36.7 C) Temp Source: Oral Pulse Rate: 84 Resp: 18 BP: 106/68 Oxygen Therapy SpO2: 100 % O2 Device: Room Air Pain:  Pt with no c/o pain   Therapy/Group: Individual Therapy  Simonne Come 09/01/2021, 3:16 PM

## 2021-09-02 ENCOUNTER — Ambulatory Visit: Payer: 59 | Admitting: Thoracic Surgery (Cardiothoracic Vascular Surgery)

## 2021-09-02 ENCOUNTER — Ambulatory Visit: Payer: 59

## 2021-09-02 LAB — GLUCOSE, CAPILLARY
Glucose-Capillary: 136 mg/dL — ABNORMAL HIGH (ref 70–99)
Glucose-Capillary: 139 mg/dL — ABNORMAL HIGH (ref 70–99)
Glucose-Capillary: 128 mg/dL — ABNORMAL HIGH (ref 70–99)
Glucose-Capillary: 155 mg/dL — ABNORMAL HIGH (ref 70–99)

## 2021-09-02 NOTE — Patient Care Conference (Signed)
Inpatient RehabilitationTeam Conference and Plan of Care Update Date: 09/02/2021   Time: 13:29 PM    Patient Name: [name]      Medical Record Number: [id number]  Date of Birth: 02-27-1956 Sex: Female         Room/Bed: 5C07C/5C07C-01 Payor Info: Payor: [employer] / Plan: [employer] / Product Type: *No Product type* /    Admit Date/Time:  08/11/2021  3:14 PM  Primary Diagnosis:  [facility] Problems: Principal Problem:   Debility Active Problems:   Acute blood loss anemia   Diabetic peripheral neuropathy ([place])   Subcutaneous nodule    Expected Discharge Date: Expected Discharge Date: 09/12/21  Team Members Present: Physician leading conference: Dr. Leeroy Cha Nurse Present: Dorien Chihuahua, RN PT Present: [name] Sax, PT OT Present: Simonne Come, OT PPS Coordinator present : Gunnar Fusi, SLP     Current Status/Progress Goal Weekly Team Focus  Bowel/Bladder   continent of bowel and bladder  maintain continuent  assess q 4 hours and prn   Swallow/Nutrition/ Hydration             ADL's   Min-max assist sit >stand depending on fatigue and height of surface, Min assist bathing, Mod assist dressing, Mod assist stand pivot transfers.  Supervision - may need to downgrade goals  ADL retraining, sternal precautions during self-care/functional tasks, transfers, activity tolerance/endurance, d/c planning   Mobility   Min/mod A for bed mobility due to sternal precautions, CGA-min A for transfers w/RW, gait up to 15' w/RW and CGA-Min A for retropulsion correction/decreased step clearance resulting in tripping. Wound vac making squealing noises throughout session  supervision, CGA for steps  Participation, functional mobility/transfers, dynamic standing balance/coordination, global conditioning, adherence to precautions   Communication             Safety/Cognition/ Behavioral Observations            Pain   pain 4 out of 10 on sternum  keep pain  manageable  assess q shift and prn   Skin   incision on chest wound vac 125, wound vac changed yesterday. Masd on bottom  maintain skin integrity  assess q shift and prn     Discharge Planning:  Pt plans to go back to daughter's but needs to be supervision levle where she was prior to medical set back. Deconditioned now and has endurance issues.   Team Discussion: Wound vac remains in place; MD to check with surgeon on plan for discontinuing. Constipation addressed. Left hand pain had improved however has restarted.  Patient on target to meet rehab goals: no,  and currently not progressing; appears to have plateaued.  Functional status fluctuates from [place] - Mod assist. Able to ambulate up to 10' w RW at [facility] however overall performance limited by poor endurance.  *See Care Plan and progress notes for long and short-term goals.   Revisions to Treatment Plan:  Work on steps, endurance   Teaching Needs: Safety, medications, wound care, secondary risk management, transfers, toileting, etc.  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, now voiding better  Barriers to Discharge: IV antibiotics;Medical stability  Barriers to Discharge Comments: constipation, wound vac for lower portion of sternal incision, 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, elevated systolic BP Possible Resolutions to [employer]: improved with sorbitol, continue  constipation regimen, continue wound vac, contine gabapentin for neuropathic pain   Continued Need for Acute Rehabilitation Level of Care: The patient requires daily medical management by a physician with specialized training in physical medicine and rehabilitation for the following reasons: Direction of a multidisciplinary physical rehabilitation program to maximize functional independence : Yes Medical management of patient stability for increased activity during participation in an intensive rehabilitation regime.: Yes Analysis of laboratory values and/or radiology reports with any subsequent need for medication adjustment and/or medical intervention. : Yes   I attest that I was present, lead the team conference, and concur with the assessment and plan of the team.   Dorien Chihuahua B 09/02/2021, 3:57 PM

## 2021-09-02 NOTE — Progress Notes (Signed)
Occupational Therapy Session Note  Patient Details  Name: [name] MRN: [id number] Date of Birth: [birth year]  Today's Date: 09/02/2021 OT Individual Time: 3557-3220 OT Individual Time Calculation (min): 75 min    Short Term Goals: Week 3:  OT Short Term Goal 1 (Week 3): STG = LTGs due to ELOS  Skilled Therapeutic Interventions/Progress Updates:  Pt greeted supine in bed bed  agreeable to OT intervention. Session focus on BADL reeducation. Pt completed bed mobility with CGA with increased time and effort and cues needed to recall sternal precautions functionally. Pt completed EOB bathing with MOD A needing most assist to wash back side in standing. Pt opted to leave gown on, MOD A for LB Dressing needing assist to thread LLE and pull pants up to waist line on L side. Total A to don socks and TEDS. Pt completed multiple sit<>stands during session from elevated EOB with CGA, but as bed height lowered pt required up to MOD A for sit<>stand.  pt request to rest after session d/t fatigue, increased time and effort to return to supine. Pt left supine in bed with bed alarm activated and all needs within reach.                        Therapy Documentation Precautions:  Precautions Precautions: Fall, Sternal Restrictions Weight Bearing Restrictions: No Other Position/Activity Restrictions: sternal precautions  Pain: 2/10 pain in L hand, provided rest breaks and compensatory methods during session, pt also c/o pain in R thigh,provided hot packs as pain mgmt strategy.     Therapy/Group: Individual Therapy  Precious Haws 09/02/2021, 12:58 PM

## 2021-09-02 NOTE — Progress Notes (Addendum)
Patient ID: [name], female   DOB: 1956-05-02, 65 y.o.   MRN: [id number] Met with pt to discuss her levels and her need to push herself to get back to the level she was prior to her medical complications and the concern insurance may not cover for much longer. She has no plans to go to a SNF and plans to go home. She states: " You tell them to pay for the infection I got here." She is making progress but not back to the level she was at prior to wound infection. She hopes to not go home with a wound vac either. Will update after team conference.  2:43 PM Met with pt to update her on team conference goals and progress. New target discharge date of 9/30. She hopes she can reach her goals by then. She plans to push herself and be hopeful she is going home from here and not to another facility, she says. Continue to work on discharge needs.

## 2021-09-02 NOTE — Progress Notes (Signed)
Physical Therapy Session Note  Patient Details  Name: [name] MRN: [id number] Date of Birth: 1956-07-05  Today's Date: 09/02/2021 PT Individual Time: 1030-1055 and 1400-1424  PT Individual Time Calculation (min): 25 min and 24 min  Short Term Goals: Week 2:  PT Short Term Goal 1 (Week 2): STG=LTG due to LOS Week 3:  PT Short Term Goal 1 (Week 3): STG=LTG due to extended LOS due to medical complications  Skilled Therapeutic Interventions/Progress Updates:   Treatment Session 1 Received pt supine in bed, pt agreeable to PT treatment, and reported increased pain in L hand and not sleeping well last night from having constant BM "blowouts". Session with emphasis on functional mobility/transfers, generalized strengthening, dynamic standing balance/coordination, and improved activity tolerance. CSW present during session to discuss long term disability. Pt transferred semi-reclined<>sitting EOB with CGA while adhering to sternal precautions. Sit<>stand from elevated bed with RW and min A and stand<>pivot bed<>TIS WC with RW and CGA/min A. Discussed potentially getting hospital bed as pt relies on sitting up with HOB elevated and standing from elevated EOB. Concluded session with pt semi-reclined in TIS WC, needs within reach, and seatbelt alarm on. Provided pt with fresh drink.   Treatment Session 2 Received pt sitting in TIS WC, pt agreeable to PT treatment, and denied any pain during session but reported fatigue from sitting up all morning. Session with emphasis on functional mobility/transfers, generalized strengthening, dynamic standing balance/coordination, gait training, and improved activity tolerance. Sit<>stand with RW and min A with LUE supported on hand orthosis. Pt ambulated 84ft with RW and min A with close WC follow. Pt demonstrated improvements in LLE foot clearance, step length, posture, and ability to maintain LUE on RW orthosis. Returned to room and transferred stand<>pivot TIS  WC<>bed with RW and min A and side stepped to [facility] with CGA. Worked on static standing balance without UE support and CGA for ~15-20 seconds x 3 trials with mild unsteadiness but pt able to self-correct LOB. Sit<>supine with min A for LLE management. CSW present during session to inform pt of new D/C date of 9/30; pt in agreement and motivated to work hard. Concluded session with pt semi-reclined in bed, needs within reach, and bed alarm on.   Therapy Documentation Precautions:  Precautions Precautions: Fall, Sternal Restrictions Weight Bearing Restrictions: No Other Position/Activity Restrictions: sternal precautions  Therapy/Group: Individual Therapy Alfonse Alpers PT, DPT   09/02/2021, 7:32 AM

## 2021-09-02 NOTE — Progress Notes (Signed)
Occupational Therapy Session Note  Patient Details  Name: [name] MRN: [id number] Date of Birth: 06-Dec-1956  Today's Date: 09/02/2021 OT Individual Time: 4332-9518 OT Individual Time Calculation (min): 58 min    Short Term Goals: Week 1:  OT Short Term Goal 1 (Week 1): Pt will complete sit > stand with CGA while maintaining sternal precautions OT Short Term Goal 1 - Progress (Week 1): Met OT Short Term Goal 2 (Week 1): Pt will complete LB dressing with min assist while adhering ot sternal precautions OT Short Term Goal 2 - Progress (Week 1): Met OT Short Term Goal 3 (Week 1): Pt will complete toileting (clothing management and hygiene) with CGA while adhering to sternal precautions OT Short Term Goal 3 - Progress (Week 1): Met Week 2:  OT Short Term Goal 1 (Week 2): STG = LTGs due to remaining LOS OT Short Term Goal 1 - Progress (Week 2): Other (comment) (pt with medical status change requiring extended LOS)  Skilled Therapeutic Interventions/Progress Updates:    Pt greeted at time of session semireclined in bed resting but easily woken and agreeable to OT session. No pain reported until end of session resting in bed, RN present for med pass for pain meds. Supine > sit Mod A for log roll and maintain sternal precautions. Stand pivot bed > BSC by bed side with CGA/Min with RW and Max A overall for clothing management, pt able to perform pericare in sitting. Sit > stand Min/Mod from lower [facility] and therapist switching out [facility] for TIS chair. Pt requesting transport > vending machine, visiting on 4th floor and [place] 5th floor to find food of choice. Pt problem solving cash amount to direct therapist to order snack of choice. Transported back to room, stand pivot wheelchair > bed Min A. Alarm on call bell in reach.   Therapy Documentation Precautions:  Precautions Precautions: Fall, Sternal Restrictions Weight Bearing Restrictions: No Other Position/Activity Restrictions: sternal  precautions      Therapy/Group: Individual Therapy  Viona Gilmore 09/02/2021, 4:35 PM

## 2021-09-02 NOTE — Progress Notes (Addendum)
PROGRESS NOTE   Subjective/Complaints: Finally had a good BM yesterday Left hand pain has returned- discussed referral to outpatient neuropathy clinic.   ROS: Patient denies fever, rash, sore throat, blurred vision, nausea, vomiting, diarrhea, cough, shortness of breath or chest pain, joint or back pain, headache, or mood change.   Objective:   No results found. No results for input(s): WBC, HGB, HCT, PLT in the last 72 hours.   Recent Labs    09/01/21 1204  NA 135  K 4.0  CL 95*  CO2 28  GLUCOSE 148*  BUN <5*  CREATININE 0.87  CALCIUM 9.1      Intake/Output Summary (Last 24 hours) at 09/02/2021 1009 Last data filed at 09/02/2021 0745 Gross per 24 hour  Intake 960 ml  Output --  Net 960 ml   Physical Exam: Vital Signs Blood pressure (!) 147/72, pulse 72, temperature 98.2 F (36.8 C), temperature source Oral, resp. rate 16, height 5\' 7"  (1.702 m), weight 93.3 kg, SpO2 98 %. Gen: no distress, normal appearing HEENT: oral mucosa pink and moist, NCAT Cardio: Reg rate Chest: normal effort, normal rate of breathing Abd: soft, non-distended Ext: no edema Psych: pleasant, normal affect  Skin: vac to chest with s/s drainage (scant) Musc: No edema in extremities.  No tenderness in extremities. Relatively large, slightly firm, nontender nodule on right inner thigh Neuro: Alert and oriented Motor: Residual left sided hemiparesis from prior stroke, wrist drop, no changes. 5/5 throughout but 0/5 left wrist extension, 3/5 left wrist hand grip.   Assessment/Plan: 1. Functional deficits which require 3+ hours per day of interdisciplinary therapy in a comprehensive inpatient rehab setting. Physiatrist is providing close team supervision and 24 hour management of active medical problems listed below. Physiatrist and rehab team continue to assess barriers to discharge/monitor patient progress toward functional and medical  goals  Care Tool:  Bathing    Body parts bathed by patient: Right arm, Left arm, Chest, Abdomen, Front perineal area, Buttocks, Right upper leg, Left upper leg, Face   Body parts bathed by helper: Right lower leg, Left lower leg     Bathing assist Assist Level: Moderate Assistance - Patient 50 - 74%     Upper Body Dressing/Undressing Upper body dressing   What is the patient wearing?: Button up shirt    Upper body assist Assist Level: Minimal Assistance - Patient > 75%    Lower Body Dressing/Undressing Lower body dressing      What is the patient wearing?: Pants     Lower body assist Assist for lower body dressing: Moderate Assistance - Patient 50 - 74%     Toileting Toileting    Toileting assist Assist for toileting: Total Assistance - Patient < 25%     Transfers Chair/bed transfer  Transfers assist     Chair/bed transfer assist level: Contact Guard/Touching assist     Locomotion Ambulation   Ambulation assist      Assist level: Contact Guard/Touching assist Assistive device: Walker-rolling Max distance: 15'   Walk 10 feet activity   Assist     Assist level: Contact Guard/Touching assist Assistive device: Walker-rolling   Walk 50 feet activity   Assist  Walk 50 feet with 2 turns activity did not occur: Safety/medical concerns (fatigue, LE weakness, decreased balance/postural control, deconditioning)  Assist level: Moderate Assistance - Patient - 50 - 74%      Walk 150 feet activity   Assist Walk 150 feet activity did not occur: Safety/medical concerns (fatigue, LE weakness, decreased balance/postural control, deconditioning)         Walk 10 feet on uneven surface  activity   Assist Walk 10 feet on uneven surfaces activity did not occur: Safety/medical concerns (fatigue, LE weakness, decreased balance/postural control, deconditioning)         Wheelchair     Assist Is the patient using a wheelchair?: Yes Type of  Wheelchair: Manual Wheelchair activity did not occur: Safety/medical concerns (fatigue, LE weakness, decreased balance/postural control, deconditioning)         Wheelchair 50 feet with 2 turns activity    Assist    Wheelchair 50 feet with 2 turns activity did not occur: Safety/medical concerns (fatigue, LE weakness, decreased balance/postural control, deconditioning)       Wheelchair 150 feet activity     Assist  Wheelchair 150 feet activity did not occur: Safety/medical concerns (fatigue, LE weakness, decreased balance/postural control, deconditioning)       Blood pressure (!) 147/72, pulse 72, temperature 98.2 F (36.8 C), temperature source Oral, resp. rate 16, height 5\' 7"  (1.702 m), weight 93.3 kg, SpO2 98 %.  Medical Problem List and Plan: 1.  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  Status post median sternotomy resection of fibroelastoma from aortic valve/CABG x4 08/04/2021.  Sternal precautions  -Continue CIR therapies including PT, OT  2.  Impaired mobility, ambulation decreased from 40 to 1 feet following sternal infection/associated fatigue, encouraged to resume ambulation tomorrow with therapy -DVT/anticoagulation:  Pharmaceutical: continue Lovenox             -antiplatelet therapy: Continue Aspirin 325 mg daily 3. Post-stroke left hand pain: Robaxin and tramadol as needed. Added B6 50mg  daily to improve sensitivity to pain. Decreased Gabapentin to 100mg  BID and 600mg  HS  Controlled on 9/19 4. Depression/Anxiety Continue Zoloft 100 mg daily, decrease xanax to daily PRN             -antipsychotic agents: N/A 5. Neuropsych: This patient is capable of making decisions on her own behalf. 6. Chest incision: Healing well, continue Routine skin checks 7. Fluids/Electrolytes/Nutrition: Routine in and outs 8.ABLA. monitor weekly.   Hemoglobin 8.8 on 9/14, follow up this week 9.  Diabetes mellitus with peripheral neuropathy.   Hemoglobin A1c 7.8.  DiaBeta 5 mg daily, Glucophage 1000 mg twice daily PTA.   Glucophage 500 twice daily started on 9/18--observe for effect 9/19 10  Hypertension.   Lasix 40 mg daily , hydralazine 10 mg nightly, Cozaar 100 mg daily, Lopressor 12.5 mg twice daily DC'd Continue Norvasc 10 Cozaar 50 restarted on 9/18--observe 9/19 11.  GERD.  Protonix 12.  Restless leg syndrome.  Requip nightly--HELD dt lethargy 13.  Hyperlipidemia.  Lipitor 14.  Obesity.  BMI 30.83.  Dietary follow-up 15.  History of right thalamic infarction 5/22.  Received CIR.  Continue aspirin. 16. Left wrist drop: splint ordered 17. Vitamin D deficiency: start ergocalciferol 50,000U once per week  18. Suboptimal magnesium: started 250mg  magnesium gluconate HS. Conitnue magnesium oxide 400mg  BID  19. Dry mouth: stop Cymbalta. Improved 20. Dizziness/weakness: UA normal. CT ordered given significant decline and risk factors for stroke- negative.  21. Leukocytosis: WBC up again 9/13, appreciate CT  surgery following,  -s/p vac placement for sternal infection.  -Hemoglobin 8.8 on 9/14 -continue cefepime 22. Hyponatremia- lasix stopped 9/9    Na+ 135 on 9/14 and 9/19, monitor weekly.   -off of IVF 23. AKI: Resolved. Cr 0.87 on 9/20- monitor weekly.  16. Urinary retention: counseled regarding importance of movement, preventing constipation.  Started bethanacol 5mg  TID.  15. Left hand swelling: continue to elevate, asked RN to apply ice.  16. Postsurgical chest pain: change Tylenol to Tylenol #3 q8H prn- improved 17. Cracked lips: blistex PRN ordered.  18. Constipation: continue dulcolax, added colace 50mg  BID. resolved 19. Right inner thigh subcutaneous nodules Patient states she noticed this a few days ago, nontender, not enlarging.  Suspect that these are small hematomas -discussed warm compresses, observation 20. Disposition: Goal MinA on Friday 9/30- she will only have intermittent support at home from her daughter.    LOS:  22 days A FACE TO FACE EVALUATION WAS PERFORMED  Erdine Hulen P Hailey Miles 09/02/2021, 10:09 AM

## 2021-09-03 LAB — GLUCOSE, CAPILLARY
Glucose-Capillary: 121 mg/dL — ABNORMAL HIGH (ref 70–99)
Glucose-Capillary: 176 mg/dL — ABNORMAL HIGH (ref 70–99)
Glucose-Capillary: 138 mg/dL — ABNORMAL HIGH (ref 70–99)
Glucose-Capillary: 104 mg/dL — ABNORMAL HIGH (ref 70–99)

## 2021-09-03 MED ORDER — BETHANECHOL CHLORIDE 10 MG PO TABS
5.0000 mg | ORAL_TABLET | Freq: Every day | ORAL | Status: DC
  Administered 2021-09-04 – 2021-09-05 (×2): 5 mg via ORAL
  Filled 2021-09-03 (×2): qty 1

## 2021-09-03 NOTE — Progress Notes (Signed)
Physical Therapy Session Note  Patient Details  Name: [name] MRN: [id number] Date of Birth: January 17, 1956  Today's Date: 09/03/2021 PT Individual Time: 1100-1155 and 1400-1426  PT Individual Time Calculation (min): 55 min and 26 min  Short Term Goals: Week 3:  PT Short Term Goal 1 (Week 3): STG=LTG due to extended LOS due to medical complications Week 4:  PT Short Term Goal 1 (Week 4): STG=LTG due to extended LOS due to medical complications  Skilled Therapeutic Interventions/Progress Updates:  Treatment Session 1 Received pt sitting in TIS WC with RN present at bedside, pt agreeable to PT treatment, and denied any pain during session. Session with emphasis on functional mobility/transfers, generalized strengthening, dynamic standing balance/coordination, gait training, and improved activity tolerance. Pt spent time petting pet therapy dog, Dixie to uplift mood/spirits. Pt transported to/from room on 5C in TIS WC total A for time management purposes. Sit<>stand with RW and CGA throughout session (with 1 instance of min A due to R lateral LOB) and ambulated 90ft x 1 and 9ft x 1 with RW and CGA/min A with total A for WC follow and equipment management. Pt demonstrates improvements in ability to advance/control LLE but increased LLE foot drag and mild increase in L knee flexion with fatigue. Pt required multiple rest breaks throughout due to fatigue but remained motivated to push herself throughout. Pt requested to return to bed to rest up for afternoon session. Stand<>pivot TIS WC<>bed with RW and CGA and sit<>supine with supervision with HOB slightly elevated while maintaining sternal precautions. Concluded session with pt semi-reclined in bed, needs within reach, and bed alarm on.   Treatment Session 2 Received pt semi-reclined in bed, pt agreeable to PT treatment, and denied any pain during session. Session with emphasis on functional mobility/transfers, generalized strengthening, dynamic  standing balance/coordination, gait training, and improved activity tolerance. Pt transferred semi-reclined<>sitting EOB with supervision with extra time while maintaining sternal precautions. Sit<>stand with RW and CGA from elevated bed and ambulated 65ft with RW and min A with total A for WC follow and wound vac management. Pt able to recognize fatigue onset (LLE begins to "bounce"/buckle) and requested to sit. Pt also demonstrates improvements with eccentric control when sitting and actively avoiding "plopping". Pt transported back to room and helped set pt up to eat lunch by preparing and cutting up burger and dressing salad for time management purposes. Provided pt with fresh drink and concluded session with pt sitting in TIS WC, needs within reach, and seatbelt alarm on awaiting OT session.  Therapy Documentation Precautions:  Precautions Precautions: Fall, Sternal Restrictions Weight Bearing Restrictions: No Other Position/Activity Restrictions: sternal precautions  Therapy/Group: Individual Therapy Alfonse Alpers PT, DPT   09/03/2021, 7:26 AM

## 2021-09-03 NOTE — Progress Notes (Signed)
PROGRESS NOTE   Subjective/Complaints: Very pleased with her progress yesterday- walked 18 steps! Complains that her food was brought at 6am and was cold- she does not usually eat until 8:30am so requested nursing to bring in her food closer to this time  ROS: Patient denies fever, rash, sore throat, blurred vision, nausea, vomiting, diarrhea, cough, shortness of breath or chest pain, joint or back pain, headache, or mood change. +neuropathic pain in left hand   Objective:   No results found. No results for input(s): WBC, HGB, HCT, PLT in the last 72 hours.   Recent Labs    09/01/21 1204  NA 135  K 4.0  CL 95*  CO2 28  GLUCOSE 148*  BUN <5*  CREATININE 0.87  CALCIUM 9.1      Intake/Output Summary (Last 24 hours) at 09/03/2021 1115 Last data filed at 09/02/2021 1250 Gross per 24 hour  Intake 240 ml  Output --  Net 240 ml   Physical Exam: Vital Signs Blood pressure 138/62, pulse 71, temperature 98.5 F (36.9 C), temperature source Oral, resp. rate 17, height 5\' 7"  (1.702 m), weight 93.5 kg, SpO2 95 %. Gen: no distress, normal appearing HEENT: oral mucosa pink and moist, NCAT Cardio: Reg rate Chest: normal effort, normal rate of breathing Abd: soft, non-distended Ext: no edema Psych: pleasant, normal affect  Skin: vac to chest with s/s drainage (scant) Musc: No edema in extremities.  No tenderness in extremities. Relatively large, slightly firm, nontender nodule on right inner thigh Neuro: Alert and oriented Motor: Residual left sided hemiparesis from prior stroke, wrist drop, no changes. 5/5 throughout but 0/5 left wrist extension, 3/5 left wrist hand grip.   Assessment/Plan: 1. Functional deficits which require 3+ hours per day of interdisciplinary therapy in a comprehensive inpatient rehab setting. Physiatrist is providing close team supervision and 24 hour management of active medical problems listed  below. Physiatrist and rehab team continue to assess barriers to discharge/monitor patient progress toward functional and medical goals  Care Tool:  Bathing    Body parts bathed by patient: Right arm, Left arm, Chest, Abdomen, Front perineal area   Body parts bathed by helper: Buttocks     Bathing assist Assist Level: Moderate Assistance - Patient 50 - 74%     Upper Body Dressing/Undressing Upper body dressing   What is the patient wearing?: Button up shirt    Upper body assist Assist Level: Minimal Assistance - Patient > 75%    Lower Body Dressing/Undressing Lower body dressing      What is the patient wearing?: Pants, Underwear/pull up     Lower body assist Assist for lower body dressing: Moderate Assistance - Patient 50 - 74%     Toileting Toileting    Toileting assist Assist for toileting: Maximal Assistance - Patient 25 - 49%     Transfers Chair/bed transfer  Transfers assist     Chair/bed transfer assist level: Minimal Assistance - Patient > 75%     Locomotion Ambulation   Ambulation assist      Assist level: Minimal Assistance - Patient > 75% Assistive device: Walker-rolling Max distance: 30ft   Walk 10 feet activity   Assist  Assist level: Minimal Assistance - Patient > 75% Assistive device: Walker-rolling   Walk 50 feet activity   Assist Walk 50 feet with 2 turns activity did not occur: Safety/medical concerns (fatigue, LE weakness, decreased balance/postural control, deconditioning)  Assist level: Moderate Assistance - Patient - 50 - 74%      Walk 150 feet activity   Assist Walk 150 feet activity did not occur: Safety/medical concerns (fatigue, LE weakness, decreased balance/postural control, deconditioning)         Walk 10 feet on uneven surface  activity   Assist Walk 10 feet on uneven surfaces activity did not occur: Safety/medical concerns (fatigue, LE weakness, decreased balance/postural control,  deconditioning)         Wheelchair     Assist Is the patient using a wheelchair?: Yes Type of Wheelchair: Manual Wheelchair activity did not occur: Safety/medical concerns (fatigue, LE weakness, decreased balance/postural control, deconditioning)         Wheelchair 50 feet with 2 turns activity    Assist    Wheelchair 50 feet with 2 turns activity did not occur: Safety/medical concerns (fatigue, LE weakness, decreased balance/postural control, deconditioning)       Wheelchair 150 feet activity     Assist  Wheelchair 150 feet activity did not occur: Safety/medical concerns (fatigue, LE weakness, decreased balance/postural control, deconditioning)       Blood pressure 138/62, pulse 71, temperature 98.5 F (36.9 C), temperature source Oral, resp. rate 17, height 5\' 7"  (1.702 m), weight 93.5 kg, SpO2 95 %.  Medical Problem List and Plan: 1.  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  Status post median sternotomy resection of fibroelastoma from aortic valve/CABG x4 08/04/2021.  Sternal precautions  -Continue CIR therapies including PT, OT  2.  Impaired mobility, ambulation decreased from 40 to 1 feet following sternal infection/associated fatigue, encouraged to resume ambulation tomorrow with therapy -DVT/anticoagulation:  Pharmaceutical: continue Lovenox             -antiplatelet therapy: Continue Aspirin 325 mg daily 3. Post-stroke left hand pain: Robaxin and tramadol as needed. Added B6 50mg  daily to improve sensitivity to pain. Decreased Gabapentin to 100mg  BID and 600mg  HS  Controlled on 9/21 4. Depression/Anxiety: Continue Zoloft 100 mg daily, decrease xanax to daily PRN             -antipsychotic agents: N/A 5. Neuropsych: This patient is capable of making decisions on her own behalf. 6. Chest incision: Healing well, continue Routine skin checks 7. Fluids/Electrolytes/Nutrition: Routine in and outs 8.ABLA. monitor weekly.    Hemoglobin 8.8 on 9/14, follow up this week 9.  Diabetes mellitus with peripheral neuropathy.  Hemoglobin A1c 7.8.  DiaBeta 5 mg daily, Glucophage 1000 mg twice daily PTA.   Glucophage 500 twice daily started on 9/18--observe for effect 9/19 10  Hypertension.   Lasix 40 mg daily , hydralazine 10 mg nightly, Cozaar 100 mg daily, Lopressor 12.5 mg twice daily DC'd Continue Norvasc 10 Cozaar 50 restarted on 9/18--observe 9/19 11.  GERD.  Continue Protonix 12.  Restless leg syndrome.  Requip nightly--HELD dt lethargy 13.  Hyperlipidemia.  Lipitor 14.  Obesity.  BMI 30.83.  Dietary follow-up 15.  History of right thalamic infarction 5/22.  Received CIR.  Continue aspirin. 16. Left wrist drop: splint ordered 17. Vitamin D deficiency: start ergocalciferol 50,000U once per week  18. Suboptimal magnesium: started 250mg  magnesium gluconate HS. Conitnue magnesium oxide 400mg  BID  19. Dry mouth: stop Cymbalta. Improved 20. Dizziness/weakness: UA normal.  CT ordered given significant decline and risk factors for stroke- negative.  21. Leukocytosis: WBC up again 9/13, appreciate CT surgery following,  -s/p vac placement for sternal infection.  -Hemoglobin 8.8 on 9/14 -continue cefepime 22. Hyponatremia- lasix stopped 9/9    Na+ 135 on 9/14 and 9/19, monitor weekly.   -off of IVF 23. AKI: Resolved. Cr 0.87 on 9/20- monitor weekly.  42. Urinary retention: counseled regarding importance of movement, preventing constipation.  Decrease Bethanacol to 5mg  daily 15. Left hand swelling: continue to elevate, asked RN to apply ice.  16. Postsurgical chest pain: change Tylenol to Tylenol #3 q8H prn- improved 17. Cracked lips: blistex PRN ordered. Asked nurse to provide her another tube.  18. Constipation: continue dulcolax, added colace 50mg  BID. Resolved  19. Right inner thigh subcutaneous nodules Patient states she noticed this a few days ago, nontender, not enlarging.  Suspect that these are small  hematomas -discussed warm compresses, observation 20. Disposition: Goal MinA on Friday 9/30- she will only have intermittent support at home from her daughter. Encouraged her about the 18 steps she took yesterday and discussed goal of >20 today!  LOS:  23 days A FACE TO FACE EVALUATION WAS PERFORMED  [name] 09/03/2021, 11:15 AM

## 2021-09-03 NOTE — Progress Notes (Signed)
Physical Therapy Weekly Progress Note  Patient Details  Name: [name] MRN: [id number] Date of Birth: 09-21-1956  Beginning of progress report period: August 12, 2021 End of progress report period: September 03, 2021  Patient has met 0 of 8 long term goals but is making gradual progress! Pt's LOS has been extended due to medical complications. On 9/11, pt underwent surgical I&D for removal of infection at sternal incision with wound vac still in place. Pt is currently able to perform bed mobility with HOB elevated with CGA/min A with cues to adhere to sternal precautions and transfer sit<>stand from elevated surfaces with CGA/light min A but can require up to heavy mod A to stand from lower surfaces. Pt is currently able to ambulate up to 2f with RW and min A. Pt remains motivated to work hard in therapy to discharge home but continues to be limited by occasional pain at sternal incision and in L hand, generalized weakness/deconditioning, L hemiparesis, and decreased balance/postural control.   Patient continues to demonstrate the following deficits muscle weakness, decreased cardiorespiratoy endurance, impaired timing and sequencing, abnormal tone, unbalanced muscle activation, decreased coordination, and decreased motor planning, and decreased standing balance, decreased postural control, hemiplegia, decreased balance strategies, and difficulty maintaining precautions and therefore will continue to benefit from skilled PT intervention to increase functional independence with mobility.  Patient progressing toward long term goals..  Continue plan of care.  PT Short Term Goals Week 3:  PT Short Term Goal 1 (Week 3): STG=LTG due to extended LOS due to medical complications Week 4:  PT Short Term Goal 1 (Week 4): STG=LTG due to extended LOS due to medical complications  Skilled Therapeutic Interventions/Progress Updates:  Ambulation/gait training;Discharge planning;Functional mobility  training;Psychosocial support;Therapeutic Activities;Balance/vestibular training;Disease management/prevention;Neuromuscular re-education;Skin care/wound management;Therapeutic Exercise;Wheelchair propulsion/positioning;Cognitive remediation/compensation;DME/adaptive equipment instruction;Pain management;Splinting/orthotics;UE/LE Strength taining/ROM;Community reintegration;Functional electrical stimulation;Patient/family education;Stair training;UE/LE Coordination activities   Therapy Documentation Precautions:  Precautions Precautions: Fall, Sternal Restrictions Weight Bearing Restrictions: No Other Position/Activity Restrictions: sternal precautions  Therapy/Group: Individual Therapy AAlfonse AlpersPT, DPT  09/03/2021, 7:25 AM

## 2021-09-03 NOTE — Progress Notes (Signed)
      [address]       [place] [address]             [phone]      Feels well this morning, says yesterday was a "great day"  Afebrile  Wound VAC in place- continue twice weekly dressing changes  Remo Lipps C. Roxan Hockey, MD [facility] [phone]

## 2021-09-03 NOTE — Progress Notes (Signed)
Occupational Therapy Weekly Progress Note  Patient Details  Name: [name] MRN: [id number] Date of Birth: 1956-07-20  Beginning of progress report period: August 27, 2021 End of progress report period: September 03, 2021  Today's Date: 09/03/2021 OT Individual Time: 2683-4196 OT Individual Time Calculation (min): 32 min    Short term goals not set due to estimated length of stay.  Pt is making slow progress towards goals post sternal infection.  Pt continues to be limited by pain, fatigue, and BLE weakness.  Pt is able to complete stand pivot transfers min-mod assist secondary to requiring physical assist for sit > stand due to sternal precautions or elevated surface.  Pt is ambulating ~10 feet to toilet with RW with mod-max assist due to BLE weakness and fatigue quickly impacting mobility.  Pt has decreased endurance and residual weakness from previous CVA impacting mobility.  Pt is able to complete dressing tasks with min-setup for UB and min-max depending on fatigue for LB dressing.  Pt will require physical assistance at home if she does not begin to improve back to the level he was before infection set in.  Pt plans to stay with daughter who has 4 children and is not available 24/7.  Patient continues to demonstrate the following deficits: muscle weakness, decreased cardiorespiratoy endurance, and decreased standing balance, decreased balance strategies, and difficulty maintaining precautions and therefore will continue to benefit from skilled OT intervention to enhance overall performance with BADL and Reduce care partner burden.  See Patient's Care Plan for progression toward long term goals.  Patient progressing toward long term goals..  Continue plan of care.  Skilled Therapeutic Interventions/Progress Updates:    Treatment session with focus on d/c planning and functional transfers.  Pt received upright in w/c reporting issues with daughter and possible need to change d/c  destination.  Pt reports son has an extra room that is full of a family member's stuff, that might be able to be cleaned out so she could stay with him.  Transported pt around rehab unit via w/c to allow time to discuss and process while in a different physical space.  Upon return to room, pt requesting to return to bed.  Pt completed sit > stand and stand pivot transfer with RW with CGA.  Pt able to lift BLE in to bed with increased time but no physical assistance.  Pt left semi-reclined in bed with all needs in reach.  Therapy Documentation Precautions:  Precautions Precautions: Fall, Sternal Restrictions Weight Bearing Restrictions: No Other Position/Activity Restrictions: sternal precautions General:   Vital Signs: Therapy Vitals Temp: 98.5 F (36.9 C) Temp Source: Oral Pulse Rate: 71 BP: 138/62 Oxygen Therapy SpO2: 95 % O2 Device: Room Air Pain:  Pt with no c/o pain    Therapy/Group: Individual Therapy  Simonne Come 09/03/2021, 8:53 AM

## 2021-09-03 NOTE — Progress Notes (Signed)
Occupational Therapy Session Note  Patient Details  Name: [name] MRN: [id number] Date of Birth: 01/05/1956  Today's Date: 09/03/2021 OT Individual Time: 0903-1005 OT Individual Time Calculation (min): 62 min    Short Term Goals: Week 3:  OT Short Term Goal 1 (Week 3): STG = LTGs due to ELOS OT Short Term Goal 1 - Progress (Week 3): Progressing toward goal  Skilled Therapeutic Interventions/Progress Updates:  Pt greeted supine in bed agreeable to OT intervention, session focus on BADL reeducation. Pt completed bed mobility with CGA with increased time and effort. Pt completed stand pivot transfer from EOB>BSC with RW and MIN A, MIN A for sit<>stand from EOB. MIN assist for 3/3 toileting tasks needing assist for clothing mgmt. Pt completed wash up EOB with MIN A for bathing.pt completed UB Dressing with set- up assist and LB dressing with MOD A, total A to don socks and TEDS. Pt completd seated oral care with set- up assist. Pt completed stand pivot transfer from EOB>TIS with CGA with Rw. Pt left seated in w/c with safety belt activated and all needs within reach.  Therapy Documentation Precautions:  Precautions Precautions: Fall, Sternal Restrictions Weight Bearing Restrictions: No Other Position/Activity Restrictions: sternal precautions  Pain: Pt reports mild pain in L hand, offered rest breaks and distraction as pain mgmt strategy.    Therapy/Group: Individual Therapy  Precious Haws 09/03/2021, 12:32 PM

## 2021-09-04 LAB — GLUCOSE, CAPILLARY
Glucose-Capillary: 122 mg/dL — ABNORMAL HIGH (ref 70–99)
Glucose-Capillary: 129 mg/dL — ABNORMAL HIGH (ref 70–99)
Glucose-Capillary: 119 mg/dL — ABNORMAL HIGH (ref 70–99)
Glucose-Capillary: 107 mg/dL — ABNORMAL HIGH (ref 70–99)

## 2021-09-04 MED ORDER — MORPHINE SULFATE (PF) 2 MG/ML IV SOLN
1.0000 mg | Freq: Every day | INTRAVENOUS | Status: DC | PRN
  Administered 2021-09-04: 1 mg via INTRAVENOUS
  Filled 2021-09-04: qty 1

## 2021-09-04 MED ORDER — FENTANYL CITRATE PF 50 MCG/ML IJ SOSY
12.5000 ug | PREFILLED_SYRINGE | Freq: Every day | INTRAMUSCULAR | Status: DC | PRN
  Administered 2021-09-08 – 2021-09-11 (×2): 12.5 ug via INTRAVENOUS
  Filled 2021-09-04 (×2): qty 1

## 2021-09-04 NOTE — Consult Note (Signed)
[place] Nurse wound follow up Patient receiving care in [place] Reason for Consult:Wound vac dressing change Wound type: Surgical  Pressure Injury POA: NA Measurement: 10 cm x 3.1 cm x 0.8 cm with a tunneled wound in the inferior aspect measuring 2 cm in depth Wound bed: Beefy red, friable Drainage (amount, consistency, odor) Serosanguinos in canister Periwound: pink border around the wound Dressing procedure/placement/frequency: Two pieces of black foam removed.. One small piece of white foam used to place into the inferior part of the wound and one piece black foam placed over the wound. Drape applied, immediate suction obtained at 125 mmHg. This dressing is to be changed twice weekly on M and Th. WOC will follow. Additional supplies at the bedside.  Cathlean Marseilles Tamala Julian, MSN, RN, [place], Lysle Pearl, [facility] Associate Pager [phone]

## 2021-09-04 NOTE — Progress Notes (Signed)
Physical Therapy Session Note  Patient Details  Name: [name] MRN: [id number] Date of Birth: [birth year]  Today's Date: 09/04/2021 PT Individual Time: 1100-1154 and 1430-1526  PT Individual Time Calculation (min): 54 min and 56 min  Short Term Goals: Week 3:  PT Short Term Goal 1 (Week 3): STG=LTG due to extended LOS due to medical complications Week 4:  PT Short Term Goal 1 (Week 4): STG=LTG due to extended LOS due to medical complications  Skilled Therapeutic Interventions/Progress Updates:   Treatment Session 1 Received pt sitting in TIS WC asleep, pt agreeable to PT treatment, and reported mild pain at sternal incision from getting dressing changed this morning (premedicated). Pt also reported feeling extremely fatigued and weak this morning. Session with emphasis on functional mobility/transfers, generalized strengthening, dynamic standing balance/coordination, gait training, simulated car transfers, and improved activity tolerance. Pt transported to/from room on 5C in TIS WC total A for time management purposes. Switched laundry from washer to dryer dependently for time management purposes. Pt then performed simulated car transfer via stand<>pivot with RW and CGA to enter car but mod A to stand from low car seat surface -cues for LLE positioning on floor prior to standing. Pt then ambulated 49ft x 1 and 17ft x 1 with RW and CGA, demonstrating improvements in LLE foot clearance, upright posture, and ability to maintain LUE on RW orthosis. Pt requested to return to bed at end of session to rest up for PT this afternoon. Stand<>pivot TIS WC<>bed with RW and CGA and sit<>supine with supervision with HOB elevated while maintaining sternal precautions. Concluded session with pt semi-reclined in bed, needs within reach, and bed alarm on. Provided pt with fresh drink and NT present checking blood glucose levels.   Treatment Session 2 Received pt semi-reclined in bed, pt agreeable to PT  treatment, and reported mild pain at sternal incision but declined any pain medication. RN present to disconnect IV. Session with emphasis on functional mobility/transfers, generalized strengthening, dynamic standing balance/coordination, stair navigation, and improved activity tolerance. Pt transferred semi-reclined<>sitting EOB with HOB elevated and supervision with increased time to adhere to sternal precautions. Stand<>pivot bed<>TIS WC with RW and CGA. Pt transported to/from room on 5C in [facility] total A for time management purposes. Gathered laundry from dryer dependently for time management purposes and pt navigated 4 steps with 2 rails and min A ascending and descending with a step to pattern. Pt required cues for "up with the good, down with the bad" technique. Pt then requested to return to room and fold laundry so her clothes to avoid getting wrinkled. Returned to room and therapist assisted pt in folding clothes from [facility] level with emphasis on functional use of LUE. Pt requested to return to bed and transferred TIS WC<>bed stand<>pivot with RW and CGA and sit<>semi-reclined with supervision. Concluded session with pt semi-reclined in bed, needs within reach, and bed alarm on.   Therapy Documentation Precautions:  Precautions Precautions: Fall, Sternal Restrictions Weight Bearing Restrictions: No Other Position/Activity Restrictions: sternal precautions  Therapy/Group: Individual Therapy Alfonse Alpers PT, DPT   09/04/2021, 7:28 AM

## 2021-09-04 NOTE — Progress Notes (Signed)
Physical Therapy Session Note  Patient Details  Name: [name] MRN: [id number] Date of Birth: 15-May-1956  Today's Date: 09/04/2021 PT Individual Time: 1637-1730 PT Individual Time Calculation (min): 53 min   Short Term Goals: Week 4:  PT Short Term Goal 1 (Week 4): STG=LTG due to extended LOS due to medical complications  Skilled Therapeutic Interventions/Progress Updates:   Pt received supine in bed and agreeable to therapy session. Supine>sitting L EOB, HOB partially elevated, with supervision and pt demonstrating decent compliance with sternal precautions with min cuing. R stand pivot EOB>w/c using RW with CGA for steadying - pt demos good adherence to L UE placement on RW orthotic but doesn't remember to unstrap it prior to sitting - demoed this throughout session.  Transported to/from gym in w/c for time management and energy conservation.   Gait training 163ft using RW with CGA progressing to min assist from 1 with +2 for line management - demos decreased L LE foot clearance and step length that worsens with fatigue requiring cuing for attention to it and improvement - demos trunk flexed towards crouched posturing with fatigue requiring cuing for increased trunk/hip/knee extension to maintain upright posture.   Pt reports fatigue from gait training requiring seated rest break.  Repeated sit<>stands x4 to/from w/c with B HHA to decrease UE support and maintain sternal precautions with min assist to lift to stand and cuing for increased hip/knee extension - pt demos R weight shift bias during these transfers.   Transported back to room. L stand pivot w/c>EOB using RW with CGA for steadying - again pt forgetting to remove L hand from RW orthotic prior to sitting. Sit>supine, HOB partially elevated, with supervision and pt requiring increased time to problem solve technique to maintain sternal precautions and decrease pain. Pt left supine in bed, HOB elevated, needs in reach, meal tray  set-up, bed alarm on, lines intact, and nurse present for medication administration.  Therapy Documentation Precautions:  Precautions Precautions: Fall, Sternal Restrictions Weight Bearing Restrictions: No Other Position/Activity Restrictions: sternal precautions   Pain: Pain at sternal incision site during sit>supine - cued pt for improved technique for pain management.   Therapy/Group: Individual Therapy  Tawana Scale , PT, DPT, NCS, CSRS 09/04/2021, 3:26 PM

## 2021-09-04 NOTE — Progress Notes (Signed)
PROGRESS NOTE   Subjective/Complaints: Felt pain with vac change this morning. Will change IV morphine to Fentanyl which provided her better relief last time She has no other complaints this morning.   ROS: Patient denies fever, rash, sore throat, blurred vision, nausea, vomiting, diarrhea, cough, shortness of breath or joint or back pain, headache, or mood change. +neuropathic pain in left hand, +chest pain after vac change  Objective:   No results found. No results for input(s): WBC, HGB, HCT, PLT in the last 72 hours.   Recent Labs    09/01/21 1204  NA 135  K 4.0  CL 95*  CO2 28  GLUCOSE 148*  BUN <5*  CREATININE 0.87  CALCIUM 9.1      Intake/Output Summary (Last 24 hours) at 09/04/2021 1059 Last data filed at 09/04/2021 0843 Gross per 24 hour  Intake 420 ml  Output 150 ml  Net 270 ml   Physical Exam: Vital Signs Blood pressure (!) 147/85, pulse 82, temperature 98.2 F (36.8 C), resp. rate 18, height 5\' 7"  (1.702 m), weight 93.6 kg, SpO2 98 %. Gen: no distress, normal appearing HEENT: oral mucosa pink and moist, NCAT Cardio: Reg rate Chest: normal effort, normal rate of breathing Abd: soft, non-distended Ext: no edema Psych: pleasant, normal affect Skin: vac to chest with s/s drainage (scant) Musc: No edema in extremities.  No tenderness in extremities. Relatively large, slightly firm, nontender nodule on right inner thigh Neuro: Alert and oriented Motor: Residual left sided hemiparesis from prior stroke, wrist drop, no changes. 5/5 throughout but 0/5 left wrist extension, 3/5 left wrist hand grip.   Assessment/Plan: 1. Functional deficits which require 3+ hours per day of interdisciplinary therapy in a comprehensive inpatient rehab setting. Physiatrist is providing close team supervision and 24 hour management of active medical problems listed below. Physiatrist and rehab team continue to assess  barriers to discharge/monitor patient progress toward functional and medical goals  Care Tool:  Bathing    Body parts bathed by patient: Right arm, Left arm, Chest, Abdomen, Front perineal area, Buttocks, Face, Left lower leg, Right lower leg, Left upper leg, Right upper leg   Body parts bathed by helper: Buttocks     Bathing assist Assist Level: Minimal Assistance - Patient > 75%     Upper Body Dressing/Undressing Upper body dressing   What is the patient wearing?: Pull over shirt    Upper body assist Assist Level: Set up assist    Lower Body Dressing/Undressing Lower body dressing      What is the patient wearing?: Pants     Lower body assist Assist for lower body dressing: Moderate Assistance - Patient 50 - 74%     Toileting Toileting    Toileting assist Assist for toileting: Minimal Assistance - Patient > 75%     Transfers Chair/bed transfer  Transfers assist     Chair/bed transfer assist level: Contact Guard/Touching assist     Locomotion Ambulation   Ambulation assist      Assist level: Minimal Assistance - Patient > 75% Assistive device: Walker-rolling Max distance: 41ft   Walk 10 feet activity   Assist     Assist level: Minimal  Assistance - Patient > 75% Assistive device: Walker-rolling   Walk 50 feet activity   Assist Walk 50 feet with 2 turns activity did not occur: Safety/medical concerns (fatigue, LE weakness, decreased balance/postural control, deconditioning)  Assist level: Moderate Assistance - Patient - 50 - 74%      Walk 150 feet activity   Assist Walk 150 feet activity did not occur: Safety/medical concerns (fatigue, LE weakness, decreased balance/postural control, deconditioning)         Walk 10 feet on uneven surface  activity   Assist Walk 10 feet on uneven surfaces activity did not occur: Safety/medical concerns (fatigue, LE weakness, decreased balance/postural control, deconditioning)          Wheelchair     Assist Is the patient using a wheelchair?: Yes Type of Wheelchair: Manual Wheelchair activity did not occur: Safety/medical concerns (fatigue, LE weakness, decreased balance/postural control, deconditioning)         Wheelchair 50 feet with 2 turns activity    Assist    Wheelchair 50 feet with 2 turns activity did not occur: Safety/medical concerns (fatigue, LE weakness, decreased balance/postural control, deconditioning)       Wheelchair 150 feet activity     Assist  Wheelchair 150 feet activity did not occur: Safety/medical concerns (fatigue, LE weakness, decreased balance/postural control, deconditioning)       Blood pressure (!) 147/85, pulse 82, temperature 98.2 F (36.8 C), resp. rate 18, height 5\' 7"  (1.702 m), weight 93.6 kg, SpO2 98 %.  Medical Problem List and Plan: 1.  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  Status post median sternotomy resection of fibroelastoma from aortic valve/CABG x4 08/04/2021.  Sternal precautions  -Continue CIR therapies including PT, OT  2.  Impaired mobility, ambulation decreased from 40 to 1 feet following sternal infection/associated fatigue, encouraged to resume ambulation tomorrow with therapy -DVT/anticoagulation:  Pharmaceutical: continue Lovenox             -antiplatelet therapy: Continue Aspirin 325 mg daily 3. Post-stroke left hand pain: Robaxin and tramadol as needed. Added B6 50mg  daily to improve sensitivity to pain. Decreased Gabapentin to 100mg  BID and 600mg  HS  Controlled on 9/21 4. Depression/Anxiety: Continue Zoloft 100 mg daily, decrease xanax to daily PRN             -antipsychotic agents: N/A 5. Neuropsych: This patient is capable of making decisions on her own behalf. 6. Chest incision: Healing well, continue Routine skin checks 7. Fluids/Electrolytes/Nutrition: Routine in and outs 8.ABLA. monitor weekly.   Hemoglobin 8.8 on 9/14, follow up this week 9.   Diabetes mellitus with peripheral neuropathy.  Hemoglobin A1c 7.8.  DiaBeta 5 mg daily, Glucophage 1000 mg twice daily PTA.   Glucophage 500 twice daily started on 9/18--observe for effect 9/19 10  Hypertension.   Lasix 40 mg daily , hydralazine 10 mg nightly, Cozaar 100 mg daily, Lopressor 12.5 mg twice daily DC'd Continue Norvasc 10 Cozaar 50 restarted on 9/18--observe 9/19 11.  GERD.  Continue Protonix 12.  Restless leg syndrome.  Requip nightly--HELD dt lethargy 13.  Hyperlipidemia.  Lipitor 14.  Obesity.  BMI 30.83.  Dietary follow-up 15.  History of right thalamic infarction 5/22.  Received CIR.  Continue aspirin. 16. Left wrist drop: splint ordered 17. Vitamin D deficiency: start ergocalciferol 50,000U once per week  18. Suboptimal magnesium: started 250mg  magnesium gluconate HS. Conitnue magnesium oxide 400mg  BID  19. Dry mouth: stop Cymbalta. Improved 20. Dizziness/weakness: UA normal. CT ordered given significant decline  and risk factors for stroke- negative.  21. Leukocytosis: WBC up again 9/13, appreciate CT surgery following,  -s/p vac placement for sternal infection.  -Hemoglobin 8.8 on 9/14 -continue cefepime 22. Hyponatremia- lasix stopped 9/9    Na+ 135 on 9/14 and 9/19, monitor weekly.   -off of IVF 23. AKI: Resolved. Cr 0.87 on 9/20- monitor weekly.  34. Urinary retention: counseled regarding importance of movement, preventing constipation.  Decrease Bethanacol to 5mg  daily 15. Left hand swelling: continue to elevate, asked RN to apply ice.  16. Postsurgical chest pain: change Tylenol to Tylenol #3 q8H prn- improved. IV Fentanyl ordered for vac dressing changes.  17. Cracked lips: blistex PRN ordered. Asked nurse to provide her another tube.  18. Constipation: continue dulcolax, added colace 50mg  BID. Resolved  19. Right inner thigh subcutaneous nodules Patient states she noticed this a few days ago, nontender, not enlarging.  Suspect that these are small  hematomas -discussed warm compresses, observation 20. Disposition: Goal MinA on Friday 9/30- she will only have intermittent support at home from her daughter. Encouraged her about the 18 steps she took yesterday and discussed goal of >20 today!  LOS:  24 days A FACE TO FACE EVALUATION WAS PERFORMED  Martha Clan P Aleksey Newbern 09/04/2021, 10:59 AM

## 2021-09-04 NOTE — Progress Notes (Signed)
Patient ID: [name], female   DOB: 01/27/1956, 65 y.o.   MRN: [id number] [facility] has approved pt for another seven days 9/28 with update due on this date. Will inform pt of this

## 2021-09-04 NOTE — Progress Notes (Signed)
Occupational Therapy Session Note  Patient Details  Name: [name] MRN: [id number] Date of Birth: 1956/09/12  Today's Date: 09/04/2021 OT Individual Time: [phone] OT Individual Time Calculation (min): 58 min    Short Term Goals: Week 4:  OT Short Term Goal 1 (Week 4): STG = LTGs due to remaining LOS  Skilled Therapeutic Interventions/Progress Updates:  Pt greeted supine in bed agreeable to OT intervention. Session focus on IADL participation via laundry task. Pt completed bed mobility with CGA with pt preferring to utilize bed features to transition from supine>sitting. CGA for sit<>stand from elevated EOB with RW, CGA for stand pivot tranfser from EOB>TIS. Pt transported to laundry room with total A for time mgmt . Pt completed functional mobility into luandry room with RW and MIN A,pt able to stand to place laundry into washer. Education provided on compensatory methods for completing task at home in order to maintain sternal precautions. Pt reprots at home she has top/bottom washer and plans to get reacher, pt also has detergent that is dispensed by pressing button. Pt required MIN A to step backwards out of washing room and requested w/c be pulled up closet d/t fatigue. Pt transported back to room with total A where pt completed UB dressing with set- up assist and MIN A for LB Dressing. pt left seated in w/c with all safety belt activated and all needs within reach.                        Therapy Documentation Precautions:  Precautions Precautions: Fall, Sternal Restrictions Weight Bearing Restrictions: No Other Position/Activity Restrictions: sternal precautions Pain:  Pt reports 5-6 pain in chest after wound vac change, RN enter to provide pain meds. Rest breaks provided during session.     Therapy/Group: Individual Therapy  Corinne Ports [facility] 09/04/2021, 12:05 PM

## 2021-09-05 ENCOUNTER — Other Ambulatory Visit: Payer: Self-pay

## 2021-09-05 LAB — GLUCOSE, CAPILLARY
Glucose-Capillary: 157 mg/dL — ABNORMAL HIGH (ref 70–99)
Glucose-Capillary: 103 mg/dL — ABNORMAL HIGH (ref 70–99)
Glucose-Capillary: 118 mg/dL — ABNORMAL HIGH (ref 70–99)
Glucose-Capillary: 124 mg/dL — ABNORMAL HIGH (ref 70–99)

## 2021-09-05 MED ORDER — CHLORHEXIDINE GLUCONATE 0.12 % MT SOLN
OROMUCOSAL | Status: AC
  Administered 2021-09-05: 15 mL
  Filled 2021-09-05: qty 15

## 2021-09-05 NOTE — Progress Notes (Signed)
Occupational Therapy Session Note  Patient Details  Name: [name] MRN: [id number] Date of Birth: 1956-11-09  Today's Date: 09/05/2021 OT Individual Time: 0732-0825 OT Individual Time Calculation (min): 53 min    Short Term Goals: Week 4:  OT Short Term Goal 1 (Week 4): STG = LTGs due to remaining LOS  Skilled Therapeutic Interventions/Progress Updates:    Treatment session with focus on self-care retraining and functional mobility.  Pt received supine in bed asleep requiring increased time to fully arouse.  Pt with c/o early therapy session but agreeable to completing bathing/dressing from EOB.  Discussed pt concerns and complaints with use of bed pan and improved mobility therefore plan to complete stand pivot transfers to [facility] with nursing staff for toileting needs.  Pt completed bed mobility with supervision and use of bed rails.  Pt completed UB bathing and dressing seated at EOB with setup assist.  Pt washed LB with increased time and required min assist to thread LLE as it got caught in opposite leg hole.  Pt able to complete sit > stand with CGA from elevated EOB.  Pt completed grooming tasks with setup.  Pt remained seated EOB while therapist brushed and braided hair.  Pt requesting to return to bed due to increased pain in chest with increased mobility.  Pt returned to supine supervision and left semi-reclined in bed with all needs in reach.  Therapy Documentation Precautions:  Precautions Precautions: Fall, Sternal Restrictions Weight Bearing Restrictions: No Other Position/Activity Restrictions: sternal precautions General:   Vital Signs: Therapy Vitals Temp: 98.6 F (37 C) Temp Source: Oral Pulse Rate: 77 Resp: 18 BP: 140/69 Patient Position (if appropriate): Lying Oxygen Therapy SpO2: 91 % O2 Device: Room Air Pain:  Pt with c/o pain in chest incision with mobility.  RN notified.   Therapy/Group: Individual Therapy  Simonne Come 09/05/2021, 8:47 AM

## 2021-09-05 NOTE — Progress Notes (Signed)
PROGRESS NOTE   Subjective/Complaints: Discussed changing her IV pain medicine to Fentanyl since Morphine did not provide enough relief for dressing change Walked 132 feet yesterday!! Commended on progress and she is very pleased!  ROS: Patient denies fever, rash, sore throat, blurred vision, nausea, vomiting, diarrhea, cough, shortness of breath or joint or back pain, headache, or mood change, constipation. +neuropathic pain in left hand, +chest pain after vac change  Objective:   No results found. No results for input(s): WBC, HGB, HCT, PLT in the last 72 hours.   No results for input(s): NA, K, CL, CO2, GLUCOSE, BUN, CREATININE, CALCIUM in the last 72 hours.     Intake/Output Summary (Last 24 hours) at 09/05/2021 1255 Last data filed at 09/05/2021 1253 Gross per 24 hour  Intake 360 ml  Output 475 ml  Net -115 ml   Physical Exam: Vital Signs Blood pressure 140/69, pulse 77, temperature 98.6 F (37 C), temperature source Oral, resp. rate 18, height 5\' 7"  (1.702 m), weight 85.5 kg, SpO2 91 %. Gen: no distress, normal appearing HEENT: oral mucosa pink and moist, NCAT Cardio: Reg rate Chest: normal effort, normal rate of breathing Abd: soft, non-distended Ext: no edema Psych: pleasant, normal affect  Skin: vac to chest with s/s drainage (scant) Musc: No edema in extremities.  No tenderness in extremities. Relatively large, slightly firm, nontender nodule on right inner thigh Neuro: Alert and oriented Motor: Residual left sided hemiparesis from prior stroke, wrist drop, no changes. 5/5 throughout but 0/5 left wrist extension, 3/5 left wrist hand grip.   Assessment/Plan: 1. Functional deficits which require 3+ hours per day of interdisciplinary therapy in a comprehensive inpatient rehab setting. Physiatrist is providing close team supervision and 24 hour management of active medical problems listed below. Physiatrist  and rehab team continue to assess barriers to discharge/monitor patient progress toward functional and medical goals  Care Tool:  Bathing    Body parts bathed by patient: Right arm, Left arm, Chest, Abdomen, Front perineal area, Face, Left lower leg, Right lower leg, Left upper leg, Right upper leg   Body parts bathed by helper: Buttocks     Bathing assist Assist Level: Minimal Assistance - Patient > 75%     Upper Body Dressing/Undressing Upper body dressing   What is the patient wearing?: Pull over shirt    Upper body assist Assist Level: Set up assist    Lower Body Dressing/Undressing Lower body dressing      What is the patient wearing?: Pants     Lower body assist Assist for lower body dressing: Minimal Assistance - Patient > 75%     Toileting Toileting    Toileting assist Assist for toileting: Minimal Assistance - Patient > 75%     Transfers Chair/bed transfer  Transfers assist     Chair/bed transfer assist level: Contact Guard/Touching assist Chair/bed transfer assistive device: Walker, Clinical biochemist   Ambulation assist      Assist level: Minimal Assistance - Patient > 75% Assistive device: Walker-rolling Max distance: 100   Walk 10 feet activity   Assist     Assist level: Minimal Assistance - Patient > 75% Assistive device:  Walker-rolling   Walk 50 feet activity   Assist Walk 50 feet with 2 turns activity did not occur: Safety/medical concerns (fatigue, LE weakness, decreased balance/postural control, deconditioning)  Assist level: Minimal Assistance - Patient > 75% Assistive device: Walker-rolling    Walk 150 feet activity   Assist Walk 150 feet activity did not occur: Safety/medical concerns (fatigue, LE weakness, decreased balance/postural control, deconditioning)         Walk 10 feet on uneven surface  activity   Assist Walk 10 feet on uneven surfaces activity did not occur: Safety/medical concerns  (fatigue, LE weakness, decreased balance/postural control, deconditioning)         Wheelchair     Assist Is the patient using a wheelchair?: Yes Type of Wheelchair: Manual Wheelchair activity did not occur: Safety/medical concerns (fatigue, LE weakness, decreased balance/postural control, deconditioning)         Wheelchair 50 feet with 2 turns activity    Assist    Wheelchair 50 feet with 2 turns activity did not occur: Safety/medical concerns (fatigue, LE weakness, decreased balance/postural control, deconditioning)       Wheelchair 150 feet activity     Assist  Wheelchair 150 feet activity did not occur: Safety/medical concerns (fatigue, LE weakness, decreased balance/postural control, deconditioning)       Blood pressure 140/69, pulse 77, temperature 98.6 F (37 C), temperature source Oral, resp. rate 18, height 5\' 7"  (1.702 m), weight 85.5 kg, SpO2 91 %.  Medical Problem List and Plan: 1.  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  Status post median sternotomy resection of fibroelastoma from aortic valve/CABG x4 08/04/2021.  Sternal precautions  Continue CIR therapies including PT, OT  2.  Impaired mobility, ambulation decreased from 40 to 1 feet following sternal infection/associated fatigue, encouraged to resume ambulation tomorrow with therapy -DVT/anticoagulation:  Pharmaceutical: continue Lovenox             -antiplatelet therapy: Continue Aspirin 325 mg daily 3. Post-stroke left hand pain: Continue Robaxin and tramadol as needed. Added B6 50mg  daily to improve sensitivity to pain. Decreased Gabapentin to 100mg  BID and 600mg  HS  Controlled on 9/23 4. Depression/Anxiety: Continue Zoloft 100 mg daily, decrease xanax to daily PRN             -antipsychotic agents: N/A 5. Neuropsych: This patient is capable of making decisions on her own behalf. 6. Chest incision: Healing well, continue Routine skin checks 7.  Fluids/Electrolytes/Nutrition: Routine in and outs 8.ABLA. monitor weekly.   Hemoglobin 8.8 on 9/14, repeat on Monday.  9.  Diabetes mellitus with peripheral neuropathy.  Hemoglobin A1c 7.8.  DiaBeta 5 mg daily, Glucophage 1000 mg twice daily PTA.   Glucophage 500 twice daily started on 9/18--observe for effect 9/19. Check Cr on Monday.  10  Hypertension.   Lasix 40 mg daily , hydralazine 10 mg nightly, Cozaar 100 mg daily, Lopressor 12.5 mg twice daily DC'd Continue Norvasc 10 Cozaar 50 restarted on 9/18--observe 9/19 11.  GERD.  Continue Protonix 12.  Restless leg syndrome.  Requip nightly--HELD dt lethargy 13.  Hyperlipidemia.  Lipitor 14.  Obesity.  BMI 30.83.  Dietary follow-up 15.  History of right thalamic infarction 5/22.  Received CIR.  Continue aspirin. 16. Left wrist drop: splint ordered 17. Vitamin D deficiency: start ergocalciferol 50,000U once per week  18. Suboptimal magnesium: started 250mg  magnesium gluconate HS. Conitnue magnesium oxide 400mg  BID  19. Dry mouth: stop Cymbalta. Improved 20. Dizziness/weakness: UA normal. CT ordered given significant decline  and risk factors for stroke- negative.  21. Leukocytosis: WBC up again 9/13, appreciate CT surgery following,  -s/p vac placement for sternal infection.  -Hemoglobin 8.8 on 9/14 -continue cefepime 22. Hyponatremia- lasix stopped 9/9    Na+ 135 on 9/14 and 9/19, monitor weekly.   -off of IVF 23. AKI: Resolved. Cr 0.87 on 9/20- monitor weekly.  64. Urinary retention: counseled regarding importance of movement, preventing constipation.  Resolved. D/c bethanacol 15. Left hand swelling: continue to elevate, asked RN to apply ice.  16. Postsurgical chest pain: change Tylenol to Tylenol #3 q8H prn- improved. IV Fentanyl ordered for vac dressing changes.  17. Cracked lips: blistex PRN ordered. Asked nurse to provide her another tube.  18. Constipation: continue dulcolax, added colace 50mg  BID. Resolved  19. Right inner  thigh subcutaneous nodules Patient states she noticed this a few days ago, nontender, not enlarging.  Suspect that these are small hematomas -discussed warm compresses, observation 20. Disposition: d/c home Fri 9/30  LOS:  25 days A FACE TO FACE EVALUATION WAS PERFORMED  [name] 09/05/2021, 12:55 PM

## 2021-09-05 NOTE — Progress Notes (Signed)
Physical Therapy Session Note  Patient Details  Name: [name] MRN: [id number] Date of Birth: 1955/12/21  Today's Date: 09/05/2021 PT Individual Time: 0858-1010 and 1500-1526 PT Individual Time Calculation (min): 72 min and 26 min  Short Term Goals: Week 3:  PT Short Term Goal 1 (Week 3): STG=LTG due to extended LOS due to medical complications Week 4:  PT Short Term Goal 1 (Week 4): STG=LTG due to extended LOS due to medical complications  Skilled Therapeutic Interventions/Progress Updates:   Treatment Session 1 Received pt semi-reclined in bed reporting urge to have BM, pt agreeable to PT treatment, and reported mild pain at sternal incision (premedicated). Session with emphasis on functional mobility/transfers, toileting, generalized strengthening, dynamic standing balance/coordination, gait training, and improved activity tolerance. Pt transferred semi-reclined<>sitting EOB with supervision while adhering to sternal precautions. Stand<>pivot to/from bed<>bedside commode with RW and CGA/min A from elevated EOB and required mod A for clothing management due to urgency. Pt continent of bowel (see flowsheets for details) and required total A for posterior peri-care to maintain sternal precautions; safety plan updated with toilet transfer status. MD present for morning rounds. Stand<>pivot bed<>TIS WC with RW and CGA from elevated EOB and pt transported to/from room on 5C in [facility] total A for time management purposes. Pt requested to go to [employer] to withdraw money and transported to ground floor in TIS WC total A. Pt able to independently withdraw money from [facility] level. Transported to 4W therapy gym and transferred sit<>stand with RW and CGA and ambulated 11ft with RW and CGA fading to min A with fatigue. Pt demonstrated increased crouched gait pattern with increased L knee flexion with fatigue. In dayroom, pt performed seated BLE strengthening on Kinetron at 20 cm/sec for 1 minute x 4 trials  with emphasis on quad/glute strengthening. Pt requested to get diet drink from vending machine and transported to 5N in TIS WC total A. Therapist assisted pt in getting drinks from vending machine for time management purposes. Pt requested to return to bed at end of session due to increased sternal pain. Stand<>pivot TIS WC <>bed with RW and CGA and sit<>semi-reclined with supervision. Concluded session with pt semi-reclined in bed, needs within reach, and bed alarm on.   Treatment Session 2 Received pt semi-reclined in bed asleep, upon wakening pt agreeable to PT treatment, and denied any pain during session but reported urge to void. Session with emphasis on functional mobility/transfers, toileting, generalized strengthening, dynamic standing balance/coordination, and improved activity tolerance. Pt transferred semi-reclined<>sitting EOB with supervision and requested to transfer to bedside commode rather than walk to bathroom to simulate set up with nursing.  Pt transferred to/from bedside commode with RW and CGA/min A from elevated bed with cues to fasten LUE into RW hand orthosis. Pt required min A for clothing management and able to void and perform peri-care with supervision. Sit<>stand from elevated EOB and side stepped L to [facility] with RW and CGA. Sit<>semi-reclined with supervision. Concluded session with pt semi-reclined in bed, needs within reach, and bed alarm on. Provided pt with fresh drink.   Therapy Documentation Precautions:  Precautions Precautions: Fall, Sternal Restrictions Weight Bearing Restrictions: No Other Position/Activity Restrictions: sternal precautions  Therapy/Group: Individual Therapy Alfonse Alpers PT, DPT   09/05/2021, 7:21 AM

## 2021-09-05 NOTE — Progress Notes (Signed)
Physical Therapy Session Note  Patient Details  Name: [name] MRN: [id number] Date of Birth: 1955/12/28  Today's Date: 09/05/2021 PT Individual Time: 1110-1205 PT Individual Time Calculation (min): 55 min   Short Term Goals: Week 3:  PT Short Term Goal 1 (Week 3): STG=LTG due to extended LOS due to medical complications  Skilled Therapeutic Interventions/Progress Updates: Pt presents supine in bed but agreeable to therapy.  Pt performed sup to sit w/ HOB elevated and cueing to perform log roll technique.  Pt transfers sit to stand w/ CGA.  Pt SPT to w/c w/ CGA and RW.  Pt wheeled to gym for time conservation.  Pt amb multiple trials w/ RW up to 110', 90', 60' and 40'  Pt required +2 to bring chair on 3rd trial 2/2 fatigue.  Pt required min A and verbal cue for upright posture to improve LLE swing through phase.  Pt required seated rest breaks 2/2 fatigue.  Pt amb x 15' w/ RW and min A into room and to bed .  Pt transferred sit to supine w/ supervision.  Bed alarm on and all needs in reach.     Therapy Documentation Precautions:  Precautions Precautions: Fall, Sternal Restrictions Weight Bearing Restrictions: No Other Position/Activity Restrictions: sternal precautions General:   Vital Signs:  Pain: incision sore 2/2 recent " tug" on wound vac tube.       Therapy/Group: Individual Therapy  Ladoris Gene 09/05/2021, 12:23 PM

## 2021-09-06 LAB — GLUCOSE, CAPILLARY
Glucose-Capillary: 118 mg/dL — ABNORMAL HIGH (ref 70–99)
Glucose-Capillary: 165 mg/dL — ABNORMAL HIGH (ref 70–99)
Glucose-Capillary: 122 mg/dL — ABNORMAL HIGH (ref 70–99)
Glucose-Capillary: 132 mg/dL — ABNORMAL HIGH (ref 70–99)

## 2021-09-07 LAB — GLUCOSE, CAPILLARY
Glucose-Capillary: 133 mg/dL — ABNORMAL HIGH (ref 70–99)
Glucose-Capillary: 158 mg/dL — ABNORMAL HIGH (ref 70–99)
Glucose-Capillary: 124 mg/dL — ABNORMAL HIGH (ref 70–99)
Glucose-Capillary: 108 mg/dL — ABNORMAL HIGH (ref 70–99)

## 2021-09-07 NOTE — Progress Notes (Signed)
Physical Therapy Session Note  Patient Details  Name: [name] MRN: [id number] Date of Birth: 04/12/1956  Today's Date: 09/07/2021 PT Individual Time: 1000-1100; 1445-1525 PT Individual Time Calculation (min): 60 min AND 40 min  Short Term Goals: Week 4:  PT Short Term Goal 1 (Week 4): STG=LTG due to extended LOS due to medical complications  Skilled Therapeutic Interventions/Progress Updates:    Session 1: Pt received supine in bed, agreeable to PT session. Pt reports some pain at her incision site in her chest, premedicated prior to start of therapy session. Supine to sit with CGA, HOB elevated and use of bedrail. Encouraged pt to perform bed mobility without use of hospital bed features as she will not have these at home, pt declines and reports she did not have any issue with bed mobility when she d/c home last visit. Sit to stand with CGA and RW throughout session, stand pivot transfer to Loop chair with RW and CGA. Ambulation x 70 ft, x 75 ft, x 70 ft, x 50 ft with RW and CGA for balance. Pt exhibits flexed trunk and neck during gait, able to correct with cueing. Pt also exhibits decreased L hip/knee flexion and decreased L heel strike that increases with fatigue. Pt requires extended seated rest break between each bout of ambulation to recover. Encouraged pt to remain seated in TIS chair at end of session, pt declines as she does not believe staff will transfer her back to bed. Reassured pt that she can easily transfer with RW but she declines to stay sitting up. Pt returned to bed with CGA. Pt left semi-reclined in bed with needs in reach, bed alarm in place.  Session 2: Pt received seated in bed, agreeable to PT session. Pt reports pain at sternal incision, 2/10. Nursing able to provide pain medication at beginning of session. Supine to/from sit on flat bed with no bedrails at Supervision level to simulate home environment, increased time needed due to pain. Sit to stand x 10 reps  from gradually decreased in height bed to RW, Supervision. Standing BLE strengthening therex: marches, squats x 10 reps each. Sidesteps L/R 4 x 10 ft with RW and CGA for balance. Pt returned to supine at end of session, left semi-reclined in bed with needs in reach, bed alarm in place, family present.  Therapy Documentation Precautions:  Precautions Precautions: Fall, Sternal Restrictions Weight Bearing Restrictions: No Other Position/Activity Restrictions: sternal precautions    Therapy/Group: Individual Therapy   Excell Seltzer, PT, DPT, CSRS  09/07/2021, 12:55 PM

## 2021-09-07 NOTE — Progress Notes (Signed)
PROGRESS NOTE   Subjective/Complaints: Pt seen in PT, no issues overnite   ROS: Patient denies CP, SOB, N/V/D  Objective:   No results found. No results for input(s): WBC, HGB, HCT, PLT in the last 72 hours.   No results for input(s): NA, K, CL, CO2, GLUCOSE, BUN, CREATININE, CALCIUM in the last 72 hours.     Intake/Output Summary (Last 24 hours) at 09/07/2021 1029 Last data filed at 09/07/2021 0855 Gross per 24 hour  Intake 960 ml  Output 0 ml  Net 960 ml    Physical Exam: Vital Signs Blood pressure (!) 148/66, pulse 72, temperature 98 F (36.7 C), temperature source Oral, resp. rate 16, height 5\' 7"  (1.702 m), weight 88.8 kg, SpO2 94 %.  General: No acute distress Mood and affect are appropriate Heart: Regular rate and rhythm no rubs murmurs or extra sounds Lungs: Clear to auscultation, breathing unlabored, no rales or wheezes Abdomen: Positive bowel sounds, soft nontender to palpation, nondistended Extremities: No clubbing, cyanosis, or edema   Skin: vac to chest with s/s drainage (scant) Musc: No edema in extremities.  No tenderness in extremities. Relatively large, slightly firm, nontender nodule on right inner thigh Neuro: Alert and oriented Motor: Residual left sided hemiparesis from prior stroke, wrist drop, no changes. 5/5 throughout but 0/5 left wrist extension, 3/5 left wrist hand grip.   Assessment/Plan: 1. Functional deficits which require 3+ hours per day of interdisciplinary therapy in a comprehensive inpatient rehab setting. Physiatrist is providing close team supervision and 24 hour management of active medical problems listed below. Physiatrist and rehab team continue to assess barriers to discharge/monitor patient progress toward functional and medical goals  Care Tool:  Bathing    Body parts bathed by patient: Right arm, Left arm, Chest, Abdomen, Front perineal area, Face, Left lower  leg, Right lower leg, Left upper leg, Right upper leg   Body parts bathed by helper: Buttocks     Bathing assist Assist Level: Minimal Assistance - Patient > 75%     Upper Body Dressing/Undressing Upper body dressing   What is the patient wearing?: Pull over shirt    Upper body assist Assist Level: Set up assist    Lower Body Dressing/Undressing Lower body dressing      What is the patient wearing?: Pants     Lower body assist Assist for lower body dressing: Minimal Assistance - Patient > 75%     Toileting Toileting    Toileting assist Assist for toileting: Minimal Assistance - Patient > 75%     Transfers Chair/bed transfer  Transfers assist     Chair/bed transfer assist level: Contact Guard/Touching assist Chair/bed transfer assistive device: Walker, Clinical biochemist   Ambulation assist      Assist level: Minimal Assistance - Patient > 75% Assistive device: Walker-rolling Max distance: 100   Walk 10 feet activity   Assist     Assist level: Minimal Assistance - Patient > 75% Assistive device: Walker-rolling   Walk 50 feet activity   Assist Walk 50 feet with 2 turns activity did not occur: Safety/medical concerns (fatigue, LE weakness, decreased balance/postural control, deconditioning)  Assist level: Minimal Assistance -  Patient > 75% Assistive device: Walker-rolling    Walk 150 feet activity   Assist Walk 150 feet activity did not occur: Safety/medical concerns (fatigue, LE weakness, decreased balance/postural control, deconditioning)         Walk 10 feet on uneven surface  activity   Assist Walk 10 feet on uneven surfaces activity did not occur: Safety/medical concerns (fatigue, LE weakness, decreased balance/postural control, deconditioning)         Wheelchair     Assist Is the patient using a wheelchair?: Yes Type of Wheelchair: Manual Wheelchair activity did not occur: Safety/medical concerns (fatigue,  LE weakness, decreased balance/postural control, deconditioning)         Wheelchair 50 feet with 2 turns activity    Assist    Wheelchair 50 feet with 2 turns activity did not occur: Safety/medical concerns (fatigue, LE weakness, decreased balance/postural control, deconditioning)       Wheelchair 150 feet activity     Assist  Wheelchair 150 feet activity did not occur: Safety/medical concerns (fatigue, LE weakness, decreased balance/postural control, deconditioning)       Blood pressure (!) 148/66, pulse 72, temperature 98 F (36.7 C), temperature source Oral, resp. rate 16, height 5\' 7"  (1.702 m), weight 88.8 kg, SpO2 94 %.  Medical Problem List and Plan: 1.  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  Status post median sternotomy resection of fibroelastoma from aortic valve/CABG x4 08/04/2021.  Sternal precautions  Continue CIR therapies including PT, OT  2.  Impaired mobility, ambulation decreased from 40 to 1 feet following sternal infection/associated fatigue, encouraged to resume ambulation tomorrow with therapy -DVT/anticoagulation:  Pharmaceutical: continue Lovenox             -antiplatelet therapy: Continue Aspirin 325 mg daily 3. Post-stroke left hand pain: Continue Robaxin and tramadol as needed. Added B6 50mg  daily to improve sensitivity to pain. Decreased Gabapentin to 100mg  BID and 600mg  HS  Controlled on 9/25 4. Depression/Anxiety: Continue Zoloft 100 mg daily, decrease xanax to daily PRN             -antipsychotic agents: N/A 5. Neuropsych: This patient is capable of making decisions on her own behalf. 6. Chest incision: Healing well, continue Routine skin checks 7. Fluids/Electrolytes/Nutrition: Routine in and outs 8.ABLA. monitor weekly.   Hemoglobin 8.8 on 9/14, repeat on Monday.  9.  Diabetes mellitus with peripheral neuropathy.  Hemoglobin A1c 7.8.  DiaBeta 5 mg daily, Glucophage 1000 mg twice daily PTA.   Glucophage  500 twice daily started on 9/18--observe for effect 9/19. Check Cr on Monday.  10  Hypertension.   Lasix 40 mg daily , hydralazine 10 mg nightly, Cozaar 100 mg daily, Lopressor 12.5 mg twice daily DC'd Continue Norvasc 10 Cozaar 50 restarted on 9/18--observe 9/19 Vitals:   09/06/21 1347 09/07/21 0602  BP: 133/65 (!) 148/66  Pulse: 86 72  Resp: 18 16  Temp: 98.6 F (37 C) 98 F (36.7 C)  SpO2: 92% 94%   11.  GERD.  Continue Protonix 12.  Restless leg syndrome.  Requip nightly--HELD dt lethargy 13.  Hyperlipidemia.  Lipitor 14.  Obesity.  BMI 30.83.  Dietary follow-up 15.  History of right thalamic infarction 5/22.  Received CIR.  Continue aspirin. 16. Left wrist drop: splint ordered 17. Vitamin D deficiency: start ergocalciferol 50,000U once per week  18. Suboptimal magnesium: started 250mg  magnesium gluconate HS. Conitnue magnesium oxide 400mg  BID  19. Dry mouth: stop Cymbalta. Improved 20. Dizziness/weakness: UA normal. CT ordered given  significant decline and risk factors for stroke- negative.  21. Leukocytosis: WBC up again 9/13, appreciate CT surgery following,  -s/p vac placement for sternal infection.  CBC Latest Ref Rng & Units 08/27/2021 08/26/2021 08/25/2021  WBC 4.0 - 10.5 K/uL 8.3 12.2(H) 9.8  Hemoglobin 12.0 - 15.0 g/dL 8.8(L) 9.0(L) 8.6(L)  Hematocrit 36.0 - 46.0 % 27.6(L) 28.0(L) 27.2(L)  Platelets 150 - 400 K/uL 296 330 365    -continue cefazolin 22. Hyponatremia- lasix stopped 9/9    Na+ 135 on 9/14 and 9/19, monitor weekly.   -off of IVF 23. AKI: Resolved. Cr 0.87 on 9/20- monitor weekly.  20. Urinary retention: counseled regarding importance of movement, preventing constipation.  Resolved. D/c bethanacol 15. Left hand swelling: continue to elevate, asked RN to apply ice.  16. Postsurgical chest pain: change Tylenol to Tylenol #3 q8H prn- improved. IV Fentanyl ordered for vac dressing changes.  17. Cracked lips: blistex PRN ordered. Asked nurse to provide her  another tube.  18. Constipation: continue dulcolax, added colace 50mg  BID. Resolved  19. Right inner thigh subcutaneous nodules Patient states she noticed this a few days ago, nontender, not enlarging.  Suspect that these are small hematomas -discussed warm compresses, observation 20. Disposition: d/c home Fri 9/30  LOS:  27 days A FACE TO FACE EVALUATION WAS PERFORMED  Charlett Blake 09/07/2021, 10:29 AM

## 2021-09-08 LAB — CBC WITH DIFFERENTIAL/PLATELET
Abs Immature Granulocytes: 0.02 10*3/uL (ref 0.00–0.07)
Basophils Absolute: 0.1 10*3/uL (ref 0.0–0.1)
Basophils Relative: 1 %
Eosinophils Absolute: 0.4 10*3/uL (ref 0.0–0.5)
Eosinophils Relative: 6 %
HCT: 33.5 % — ABNORMAL LOW (ref 36.0–46.0)
Hemoglobin: 10.3 g/dL — ABNORMAL LOW (ref 12.0–15.0)
Immature Granulocytes: 0 %
Lymphocytes Relative: 20 %
Lymphs Abs: 1.4 10*3/uL (ref 0.7–4.0)
MCH: 27.1 pg (ref 26.0–34.0)
MCHC: 30.7 g/dL (ref 30.0–36.0)
MCV: 88.2 fL (ref 80.0–100.0)
Monocytes Absolute: 0.5 10*3/uL (ref 0.1–1.0)
Monocytes Relative: 7 %
Neutro Abs: 4.7 10*3/uL (ref 1.7–7.7)
Neutrophils Relative %: 66 %
Platelets: 360 10*3/uL (ref 150–400)
RBC: 3.8 MIL/uL — ABNORMAL LOW (ref 3.87–5.11)
RDW: 15.6 % — ABNORMAL HIGH (ref 11.5–15.5)
WBC: 7.1 10*3/uL (ref 4.0–10.5)
nRBC: 0 % (ref 0.0–0.2)

## 2021-09-08 LAB — BASIC METABOLIC PANEL
Anion gap: 11 (ref 5–15)
BUN: 5 mg/dL — ABNORMAL LOW (ref 8–23)
CO2: 27 mmol/L (ref 22–32)
Calcium: 8.7 mg/dL — ABNORMAL LOW (ref 8.9–10.3)
Chloride: 97 mmol/L — ABNORMAL LOW (ref 98–111)
Creatinine, Ser: 0.76 mg/dL (ref 0.44–1.00)
GFR, Estimated: >60 mL/min (ref >60)
Glucose, Bld: 159 mg/dL — ABNORMAL HIGH (ref 70–99)
Potassium: 4.4 mmol/L (ref 3.5–5.1)
Sodium: 135 mmol/L (ref 135–145)

## 2021-09-08 LAB — GLUCOSE, CAPILLARY
Glucose-Capillary: 135 mg/dL — ABNORMAL HIGH (ref 70–99)
Glucose-Capillary: 116 mg/dL — ABNORMAL HIGH (ref 70–99)
Glucose-Capillary: 149 mg/dL — ABNORMAL HIGH (ref 70–99)
Glucose-Capillary: 127 mg/dL — ABNORMAL HIGH (ref 70–99)

## 2021-09-08 MED ORDER — TRAMADOL HCL 50 MG PO TABS: 50.0000 mg | ORAL_TABLET | Freq: Three times a day (TID) | ORAL | Status: DC | PRN

## 2021-09-08 NOTE — Progress Notes (Signed)
PROGRESS NOTE   Subjective/Complaints: Making incredible progress and wound healing well! Plan for wound vac d/c Thursday prior to patient d/c Friday She continues to have left hand pain and swelling.  +hiccups  ROS: Patient denies fever, rash, sore throat, blurred vision, nausea, vomiting, diarrhea, cough, shortness of breath or joint or back pain, headache, or mood change, constipation. +neuropathic pain in left hand, +chest pain after vac change, +hiccups  Objective:   No results found. Recent Labs    09/08/21 0705  WBC 7.1  HGB 10.3*  HCT 33.5*  PLT 360     Recent Labs    09/08/21 0705  NA 135  K 4.4  CL 97*  CO2 27  GLUCOSE 159*  BUN 5*  CREATININE 0.76  CALCIUM 8.7*       Intake/Output Summary (Last 24 hours) at 09/08/2021 1238 Last data filed at 09/08/2021 2505 Gross per 24 hour  Intake 1459.39 ml  Output 0 ml  Net 1459.39 ml   Physical Exam: Vital Signs Blood pressure 137/74, pulse 74, temperature 99.5 F (37.5 C), temperature source Oral, resp. rate 19, height 5\' 7"  (1.702 m), weight 85.1 kg, SpO2 94 %. Gen: no distress, normal appearing HEENT: oral mucosa pink and moist, NCAT Cardio: Reg rate Chest: normal effort, normal rate of breathing Abd: soft, non-distended Ext: no edema Psych: pleasant, normal affect Skin: vac to chest with s/s drainage (scant) Musc: No edema in extremities.  No tenderness in extremities. Relatively large, slightly firm, nontender nodule on right inner thigh Neuro: Alert and oriented Motor: Residual left sided hemiparesis from prior stroke, wrist drop, no changes. 5/5 throughout but 0/5 left wrist extension, 3/5 left wrist hand grip.   Assessment/Plan: 1. Functional deficits which require 3+ hours per day of interdisciplinary therapy in a comprehensive inpatient rehab setting. Physiatrist is providing close team supervision and 24 hour management of active  medical problems listed below. Physiatrist and rehab team continue to assess barriers to discharge/monitor patient progress toward functional and medical goals  Care Tool:  Bathing    Body parts bathed by patient: Right arm, Left arm, Chest, Abdomen, Front perineal area, Face, Left lower leg, Right lower leg, Left upper leg, Right upper leg, Buttocks   Body parts bathed by helper: Buttocks     Bathing assist Assist Level: Contact Guard/Touching assist     Upper Body Dressing/Undressing Upper body dressing   What is the patient wearing?: Pull over shirt    Upper body assist Assist Level: Set up assist    Lower Body Dressing/Undressing Lower body dressing      What is the patient wearing?: Pants     Lower body assist Assist for lower body dressing: Contact Guard/Touching assist     Toileting Toileting    Toileting assist Assist for toileting: Minimal Assistance - Patient > 75%     Transfers Chair/bed transfer  Transfers assist     Chair/bed transfer assist level: Contact Guard/Touching assist Chair/bed transfer assistive device: Walker, Clinical biochemist   Ambulation assist      Assist level: Minimal Assistance - Patient > 75% Assistive device: Walker-rolling Max distance: 100   Walk 10 feet  activity   Assist     Assist level: Minimal Assistance - Patient > 75% Assistive device: Walker-rolling   Walk 50 feet activity   Assist Walk 50 feet with 2 turns activity did not occur: Safety/medical concerns (fatigue, LE weakness, decreased balance/postural control, deconditioning)  Assist level: Minimal Assistance - Patient > 75% Assistive device: Walker-rolling    Walk 150 feet activity   Assist Walk 150 feet activity did not occur: Safety/medical concerns (fatigue, LE weakness, decreased balance/postural control, deconditioning)         Walk 10 feet on uneven surface  activity   Assist Walk 10 feet on uneven surfaces  activity did not occur: Safety/medical concerns (fatigue, LE weakness, decreased balance/postural control, deconditioning)         Wheelchair     Assist Is the patient using a wheelchair?: Yes Type of Wheelchair: Manual Wheelchair activity did not occur: Safety/medical concerns (fatigue, LE weakness, decreased balance/postural control, deconditioning)         Wheelchair 50 feet with 2 turns activity    Assist    Wheelchair 50 feet with 2 turns activity did not occur: Safety/medical concerns (fatigue, LE weakness, decreased balance/postural control, deconditioning)       Wheelchair 150 feet activity     Assist  Wheelchair 150 feet activity did not occur: Safety/medical concerns (fatigue, LE weakness, decreased balance/postural control, deconditioning)       Blood pressure 137/74, pulse 74, temperature 99.5 F (37.5 C), temperature source Oral, resp. rate 19, height 5\' 7"  (1.702 m), weight 85.1 kg, SpO2 94 %.  Medical Problem List and Plan: 1.  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  Status post median sternotomy resection of fibroelastoma from aortic valve/CABG x4 08/04/2021.  Sternal precautions  Continue CIR therapies including PT, OT  2.  Impaired mobility, ambulation decreased from 40 to 1 feet following sternal infection/associated fatigue, encouraged to resume ambulation tomorrow with therapy -DVT/anticoagulation:  Pharmaceutical: continue Lovenox             -antiplatelet therapy: Continue Aspirin 325 mg daily 3. Post-stroke left hand pain: Continue Robaxin and tramadol as needed. Added B6 50mg  daily to improve sensitivity to pain. Decreased Gabapentin to 100mg  BID and 600mg  HS  Controlled on 9/26. Decrease Tramadol to q8H PRN.  4. Depression/Anxiety: Continue Zoloft 100 mg daily, decrease xanax to daily PRN             -antipsychotic agents: N/A 5. Neuropsych: This patient is capable of making decisions on her own  behalf. 6. Chest incision: Healing well, continue Routine skin checks 7. Fluids/Electrolytes/Nutrition: Routine in and outs 8.ABLA. monitor weekly.   Hemoglobin stable 9.  Diabetes mellitus with peripheral neuropathy.  Hemoglobin A1c 7.8.  DiaBeta 5 mg daily, Glucophage 1000 mg twice daily PTA.   Glucophage 500 twice daily started on 9/18--observe for effect 9/19. Check Cr on Monday.  10  Hypertension.   Lasix 40 mg daily , hydralazine 10 mg nightly, Cozaar 100 mg daily, Lopressor 12.5 mg twice daily DC'd Continue Norvasc 10 Cozaar 50 restarted on 9/18--observe 9/19 11.  GERD.  Continue Protonix 12.  Restless leg syndrome.  Requip nightly--HELD dt lethargy 13.  Hyperlipidemia.  Lipitor 14.  Obesity.  BMI 30.83.  Dietary follow-up 15.  History of right thalamic infarction 5/22.  Received CIR.  Continue aspirin. 16. Left wrist drop: splint ordered 17. Vitamin D deficiency: start ergocalciferol 50,000U once per week  18. Suboptimal magnesium: started 250mg  magnesium gluconate HS. Conitnue magnesium oxide  400mg  BID  19. Dry mouth: stop Cymbalta. Improved 20. Dizziness/weakness: UA normal. CT ordered given significant decline and risk factors for stroke- negative.  21. Leukocytosis: WBC up again 9/13, appreciate CT surgery following,  -s/p vac placement for sternal infection.  -Hemoglobin stable -transitioned to Ancef 22. Hyponatremia- lasix stopped 9/9    Na+ 135 on 9/14 and 9/19, monitor weekly.   -off of IVF 23. AKI: Resolved.  57. Urinary retention: counseled regarding importance of movement, preventing constipation.  Resolved. D/c bethanacol 15. Left hand swelling: continue to elevate, asked RN to apply ice.  16. Postsurgical chest pain: change Tylenol to Tylenol #3 q8H prn- improved. IV Fentanyl ordered for vac dressing changes.  17. Cracked lips: blistex PRN ordered. Asked nurse to provide her another tube.  18. Constipation: continue dulcolax, added colace 50mg  BID. Resolved   19. Right inner thigh subcutaneous nodules Patient states she noticed this a few days ago, nontender, not enlarging.  Suspect that these are small hematomas -discussed warm compresses, observation 20. Disposition: d/c home Fri 9/30  LOS:  28 days A FACE TO FACE EVALUATION WAS PERFORMED  Clide Deutscher Latiqua Daloia 09/08/2021, 12:38 PM

## 2021-09-08 NOTE — Progress Notes (Signed)
Physical Therapy Session Note  Patient Details  Name: [name] MRN: [id number] Date of Birth: 1956/06/07  Today's Date: 09/08/2021 PT Individual Time: 1100-1157 and 1430-1524  PT Individual Time Calculation (min): 57 min and 54 min  Short Term Goals: Week 3:  PT Short Term Goal 1 (Week 3): STG=LTG due to extended LOS due to medical complications Week 4:  PT Short Term Goal 1 (Week 4): STG=LTG due to extended LOS due to medical complications  Skilled Therapeutic Interventions/Progress Updates:   Treatment Session 1 Received pt sitting in TIS WC, pt agreeable to PT treatment, and denied any pain during session but reported fatigue from previous therapy. Pt reported feeling "drugged up" from all the medications she took after getting her wound vac dressing changed earlier and not having any energy. Session with emphasis on functional mobility/transfers, generalized strengthening, dynamic standing balance/coordination, stair navigation, toileting, and improved activity tolerance. Pt transported to/from room on 5C in [facility] total A for time management purposes. Pt navigated 4 steps with 2 rails and CGA/min A ascending and descending with a step to pattern. Pt required cues for "up with the good, down with the bad" technique. Pt then reported urge to void and requested to return to room. Sit<>stand with RW and CGA/close supervision and ambulated 87ft x 2 trials with RW and CGA to/from bathroom. Pt able to manage clothing with CGA and void and perform hygiene management with supervision. Provided pt with washcloth to clean hands and pt transferred sit<>supine with supervision while adhering to sternal precautions. Took rest break and called son, then sat EOB with supervision and brushed teeth with set up assist. Therapist brushed/styled pt's hair dependently per pt request. Pt then performed 2x8 mini squats from EOB using RW and CGA/min A for balance with emphasis on quad strengthening. Assisted pt with  sending text message to son and pt returned to supine with supervision. Concluded session with pt semi-reclined in bed, needs within reach, and bed alarm on.   Treatment Session 2 Received pt semi-reclined in bed, pt agreeable to PT treatment, and reported mild "soreness" at sternal incision but no pain. Pt very excited that she is getting her wound vac out on Thursday and RN present to disconnect IV for session. Session with emphasis on functional mobility/transfers, generalized strengthening, dynamic standing balance/coordination, gait training, toileting, and improved activity tolerance. Pt transferred semi-reclined<>sitting EOB with HOB elevated and supervision and donned crocs with supervision. Requested to don glove to LUE to see if it would help with grip on RW orthosis. Sit<>stand from elevated EOB with RW and and CGA and ambulated 145ft x1 and 71ft x 1 RW and CGA with total A to manage wound vac. Pt demonstrates improvements in step length, weight shifting to L, and quad control in stance but requires cues to maintain LUE on RW orthosis and to decreased speed, especially with increased fatigue. Pt reported urge to void and ambulated 84ft x 2 trials with RW and CGA in/out of bathroom. Pt able to manage clothing with CGA and void and perform peri-care with supervision. Doffed shoes sitting EOB with supervision and transferred sit<>supine with supervision. Concluded session with pt semi-reclined in bed, needs within reach, and bed alarm on.   Therapy Documentation Precautions:  Precautions Precautions: Fall, Sternal Restrictions Weight Bearing Restrictions: No Other Position/Activity Restrictions: sternal precautions  Therapy/Group: Individual Therapy Alfonse Alpers PT, DPT   09/08/2021, 7:27 AM

## 2021-09-08 NOTE — Consult Note (Addendum)
[place] Nurse wound follow up Patient receiving care in [facility] 5C07 This wound is progressing and has very little depth. In my opinion, this vac can be discontinued before discharge and changed to a moistened saline gauze dressing to be changed twice daily.  Reason for Consult:Wound vac dressing change Wound type: Surgical  Pressure Injury POA: NA Measurement: 10 cm x 3 cm x 0.2 cm with a tunneled wound in the inferior aspect measuring 2 cm in depth Wound bed: Pink granulation tissue Drainage (amount, consistency, odor) Serosanguinos in canister Periwound: pink border around the wound Dressing procedure/placement/frequency: 1 pieces of black foam removed.. one piece of white foam removed. One small piece of white foam used to place into the inferior part of the wound and one piece black foam placed over the wound. Drape applied, immediate suction obtained at 125 mmHg. This dressing is to be changed twice weekly on M and Th. WOC will follow. Vac to be discontinued on Thursday 9/29 and changed to W/D dressing before discharge on 9/30. [place] to [place], [place] and he is in agreement.    Cathlean Marseilles Tamala Julian, MSN, RN, [place], Lysle Pearl, [facility] Wound Treatment Associate Pager [phone]

## 2021-09-08 NOTE — Progress Notes (Signed)
Occupational Therapy Session Note  Patient Details  Name: [name] MRN: [id number] Date of Birth: 1956-11-20  Today's Date: 09/08/2021 OT Individual Time: 8676-1950 and 1304-1330 OT Individual Time Calculation (min): 58 min and 26 min   Short Term Goals: Week 4:  OT Short Term Goal 1 (Week 4): STG = LTGs due to remaining LOS  Skilled Therapeutic Interventions/Progress Updates:    1) Treatment session with focus on self-care retraining, functional transfers, and increased activity tolerance.  Pt received supine in bed reporting "drugged" from medication in prep for wound vac change but willing to participate as able.  Pt completed bed mobility with close supervision.  Pt engaged in bathing and dressing from EOB with supervision/setup for UB bathing/dressing and CGA when standing to pull pants over hips.  Pt able to achieve figure 4 position to complete LB bathing and dressing.  Therapist assisted with donning TEDS but then pt able to don/doff hospital socks and shoes.  Pt completed stand pivot transfer with close supervision with RW with no physical assistance this session.  Pt positioned at sink to allow therapist to wash hair.  Pt then remained upright in TIS w/c with all needs in reach, combing hair.    2) Treatment session with focus on sit > stand, dynamic standing balance, and functional transfers.  Pt received supine in bed still reporting fatigued due to increased meds this AM.  Pt completed bed mobility with increased time and use of hospital bed functions.  Pt completed sit > stand x3 from elevated EOB with RW and close supervision.  Pt completed stand pivot transfer to [facility] with close supervision and pt able to complete clothing management and hygiene with close supervision with UE support on RW.  Pt required light tactile cue/CGA for sit > stand from [facility] due to marginally lower surface.  Pt returned to EOB stand pivot with supervision and transitioned back to semi-reclined without  assistance.  Pt remained semi-reclined in bed with all needs in reach.  Therapy Documentation Precautions:  Precautions Precautions: Fall, Sternal Restrictions Weight Bearing Restrictions: No Other Position/Activity Restrictions: sternal precautions General:   Vital Signs: Therapy Vitals Pulse Rate: 74 BP: 137/74 Pain: Pain Assessment Pain Scale: 0-10 Pain Score: 2  Pain Type: Surgical pain Pain Location: Sternum Pain Orientation: Mid Pain Descriptors / Indicators: Aching Pain Frequency: Intermittent Pain Onset: With Activity Pain Intervention(s): Medication (See eMAR)   Therapy/Group: Individual Therapy  Simonne Come 09/08/2021, 10:46 AM

## 2021-09-09 LAB — GLUCOSE, CAPILLARY
Glucose-Capillary: 129 mg/dL — ABNORMAL HIGH (ref 70–99)
Glucose-Capillary: 122 mg/dL — ABNORMAL HIGH (ref 70–99)
Glucose-Capillary: 123 mg/dL — ABNORMAL HIGH (ref 70–99)
Glucose-Capillary: 103 mg/dL — ABNORMAL HIGH (ref 70–99)

## 2021-09-09 MED ORDER — TRAMADOL HCL 50 MG PO TABS: 50.0000 mg | ORAL_TABLET | Freq: Two times a day (BID) | ORAL | Status: DC | PRN

## 2021-09-09 NOTE — Patient Care Conference (Signed)
Inpatient RehabilitationTeam Conference and Plan of Care Update Date: 09/09/2021   Time: 13:44 PM    Patient Name: [name]      Medical Record Number: [id number]  Date of Birth: 1956-04-28 Sex: Female         Room/Bed: 5C07C/5C07C-01 Payor Info: Payor: [employer] / Plan: [place] [employer] / Product Type: *No Product type* /    Admit Date/Time:  08/11/2021  3:14 PM  Primary Diagnosis:  [place] [facility] Problems: Principal Problem:   Debility Active Problems:   Acute blood loss anemia   Diabetic peripheral neuropathy ([place])   Subcutaneous nodule    Expected Discharge Date: Expected Discharge Date: 09/12/21  Team Members Present: Physician leading conference: Dr. Leeroy Cha Social Worker Present: Ovidio Kin, LCSW Nurse Present: Dorien Chihuahua, RN PT Present: [name] Sax, PT OT Present: Simonne Come, OT PPS Coordinator present : Gunnar Fusi, SLP     Current Status/Progress Goal Weekly Team Focus  Bowel/Bladder   continent of Bladder and Bowel. last BM-9/25  maintain continuent  Assess q shift and PRN   Swallow/Nutrition/ Hydration             ADL's   CGA - Close supervision bathing and dressing at sit > stand level, benefits from elevated surfaces for increased independence with sit > stand, CGA - Close supervision transfers  Supervision  ADL retraining, sternal precautions during self-care/functional tasks, ambulatory and stand pivot trasnfers, activity tolerance/endurance, d/c planning   Mobility   bed mobility supervision with HOB elevated, transfers with RW CGA, gait 148ft with RW min A, 4 steps 2 rails min A  supervision, CGA for steps  functional mobility/transfers, dynamic standing balance/coordination, global conditioning, adherence to precautions, D/C planning, endurance.   Communication             Safety/Cognition/ Behavioral Observations            Pain   pain 5 of 10 on srernum  pain<3  assess pain q shift and PRN   Skin   surgical  incision on chest- wound vac @125  , changed 9/26.  maintain skin integrity  assess skin q shift and PRN     Discharge Planning:  Pt doing much better in therapies and making good progress toward her goals. Wound vac to be DC Thursday prior to Dc home on Friday   Team Discussion: Anticipate wound vac to be discontinued 09/10/21; wound care education ongoing. Constipation addressed.   Patient on target to meet rehab goals: yes, goals for discharge set at supervision level with CGA for steps. Able to ambulate up to 100' with close supervision,manage 4 steps with bil rails.  *See Care Plan and progress notes for long and short-term goals.   Revisions to Treatment Plan:  Working on pain management in left hand and strategies to address positioning for relief of pain. Needs HOB elevated; or transfers better from an elevated position; reviewed strategies for transfer from EOB/elevated surface.  Teaching Needs: Skin care, wound care, medications, safety, etc  Current Barriers to Discharge: Decreased caregiver support, Home enviroment access/layout, and Wound care  Possible Resolutions to Barriers: Family education HH follow up recommended Has all recommended equipment    Medical Summary Current Status: wound vac in place, healing well, neuropathic pain in left hand, constipation and urinary retention resolved  Barriers to Discharge: Medical stability  Barriers to Discharge Comments: wound vac in place, neuropthic pain in left hand, HTN Possible Resolutions to Barriers/Weekly Focus: remove wound vac on  Thursday, wean Tramadol as tolerated, continue Gabapentin, continue anti-hypertensives   Continued Need for Acute Rehabilitation Level of Care: The patient requires daily medical management by a physician with specialized training in physical medicine and rehabilitation for the following reasons: Direction of a multidisciplinary physical rehabilitation program to maximize functional independence  : Yes Medical management of patient stability for increased activity during participation in an intensive rehabilitation regime.: Yes Analysis of laboratory values and/or radiology reports with any subsequent need for medication adjustment and/or medical intervention. : Yes   I attest that I was present, lead the team conference, and concur with the assessment and plan of the team.   Dorien Chihuahua B 09/09/2021, 1:51 PM

## 2021-09-09 NOTE — Progress Notes (Signed)
Occupational Therapy Session Note  Patient Details  Name: [name] MRN: [id number] Date of Birth: 1956-06-25  Today's Date: 09/09/2021 OT Individual Time: 1036-1130 and 1302-1329 OT Individual Time Calculation (min): 54 min and 27 min   Short Term Goals: Week 4:  OT Short Term Goal 1 (Week 4): STG = LTGs due to remaining LOS  Skilled Therapeutic Interventions/Progress Updates:    1) Treatment session with focus on self-care retraining, functional mobility, and endurance.  Pt received upright in w/c upon arrival.  Pt expressing desire to wash up and change clothes.  Pt completed UB bathing and dressing after initial setup of items.  Pt completed LB dressing with CGA and assistance to don TEDS and socks.  Pt is able to don socks from EOB in figure 4 position but was unable from TIS w/c.  Pt ambulated 136' with RW with CGA, reporting fatigue and returning to room.  Pt's brother present towards end of session. Discussed current progress towards goals and areas still remaining to address.  Pt remained upright in TIS w/c awaiting next therapy session.  2) Treatment session with focus on LUE functional grasp and reach.  Pt received semi-reclined in bed reporting fatigue but wanting to work on hand.  Therapist directed pt in grasp and reach with resistive clothespins to place on target, pt able to complete with yellow and red but not green.  Pt able to remove clothespins with improved gross grasp.  Pt picking up checker pieces and placing in cup.  Pt requiring increased time and effort but able to complete without additional assist.  Pt remained semi-reclined and left with all needs in reach.  Therapy Documentation Precautions:  Precautions Precautions: Fall, Sternal Restrictions Weight Bearing Restrictions: No Other Position/Activity Restrictions: sternal precautions  Pain:  Pt with no c/o pain    Therapy/Group: Individual Therapy  Simonne Come 09/09/2021, 12:18 PM

## 2021-09-09 NOTE — Progress Notes (Signed)
Physical Therapy Session Note  Patient Details  Name: [name] MRN: [id number] Date of Birth: [birth year]  Today's Date: 09/09/2021 PT Individual Time: 1505-1530 PT Individual Time Calculation (min): 25 min   Short Term Goals: Week 1:  PT Short Term Goal 1 (Week 1): pt will perform bed mobility with min A consistantly PT Short Term Goal 1 - Progress (Week 1): Met PT Short Term Goal 2 (Week 1): pt will transfer bed<>chair with LRAD and min A PT Short Term Goal 2 - Progress (Week 1): Met PT Short Term Goal 3 (Week 1): pt will ambulate 75f with LRAD and min A PT Short Term Goal 3 - Progress (Week 1): Met Week 2:  PT Short Term Goal 1 (Week 2): STG=LTG due to LOS Week 3:  PT Short Term Goal 1 (Week 3): STG=LTG due to extended LOS due to medical complications Week 4:  PT Short Term Goal 1 (Week 4): STG=LTG due to extended LOS due to medical complications  Skilled Therapeutic Interventions/Progress Updates:  Pt received supine in bed asleep, difficult to arouse. Pt reported 4/10 pain in sternum and bilateral LE adductors, offered stretching and positional changes for pain relief and pt was premedicated. Pt performed supine <>sit EOB slowly w/S* and requested to void. Sit <>stand pivot from [facility] and CGA, pt demonstrated improved adherence to sternal precautions during transfer. Pt voided continently and performed peri care w/set-up assist. Sit <>stand pivot from BSC to EOB w/RW and CGA. Sit <>stand practice from lowered EOB for improved adherence to precautions and LE strength. Pt performed 5 reps w/verbal cues for anterior weight shift and to reduce pushing through [place]. Sit <>supine w/S* and therapist performed adductor stretch on LLE for 5 mins for pain relief. Pt was left supine in bed, heat packs on BLE adductors, all needs in reach.   Therapy Documentation Precautions:  Precautions Precautions: Fall, Sternal Restrictions Weight Bearing Restrictions: No Other  Position/Activity Restrictions: sternal precautions   Therapy/Group: Individual Therapy JCruzita LedererPlaster, PT, DPT  09/09/2021, 4:06 PM

## 2021-09-09 NOTE — Progress Notes (Signed)
PROGRESS NOTE   Subjective/Complaints: [name] has no complaints this morning She is on the commode, just had a BM Feeling much better  Very excited for her wound vac to be removed on Thursday!  ROS: Patient denies fever, rash, sore throat, blurred vision, nausea, vomiting, diarrhea, cough, shortness of breath or joint or back pain, headache, or mood change, constipation. +neuropathic pain in left hand, +chest pain after vac change, +hiccups  Objective:   No results found. Recent Labs    09/08/21 0705  WBC 7.1  HGB 10.3*  HCT 33.5*  PLT 360     Recent Labs    09/08/21 0705  NA 135  K 4.4  CL 97*  CO2 27  GLUCOSE 159*  BUN 5*  CREATININE 0.76  CALCIUM 8.7*       Intake/Output Summary (Last 24 hours) at 09/09/2021 1228 Last data filed at 09/09/2021 0700 Gross per 24 hour  Intake 580 ml  Output 0 ml  Net 580 ml   Physical Exam: Vital Signs Blood pressure 124/66, pulse 75, temperature 98.7 F (37.1 C), temperature source Oral, resp. rate 17, height 5\' 7"  (1.702 m), weight 85 kg, SpO2 93 %. Gen: no distress, normal appearing HEENT: oral mucosa pink and moist, NCAT Cardio: Reg rate Chest: normal effort, normal rate of breathing Abd: soft, non-distended Ext: no edema Psych: pleasant, normal affect  Skin: vac to chest with s/s drainage (scant) Musc: No edema in extremities.  No tenderness in extremities. Relatively large, slightly firm, nontender nodule on right inner thigh Neuro: Alert and oriented Motor: Residual left sided hemiparesis from prior stroke, wrist drop, no changes. 5/5 throughout but 0/5 left wrist extension, 3/5 left wrist hand grip.   Assessment/Plan: 1. Functional deficits which require 3+ hours per day of interdisciplinary therapy in a comprehensive inpatient rehab setting. Physiatrist is providing close team supervision and 24 hour management of active medical problems listed  below. Physiatrist and rehab team continue to assess barriers to discharge/monitor patient progress toward functional and medical goals  Care Tool:  Bathing    Body parts bathed by patient: Right arm, Left arm, Chest, Abdomen, Front perineal area, Face, Left lower leg, Right lower leg, Left upper leg, Right upper leg, Buttocks   Body parts bathed by helper: Buttocks     Bathing assist Assist Level: Contact Guard/Touching assist     Upper Body Dressing/Undressing Upper body dressing   What is the patient wearing?: Pull over shirt    Upper body assist Assist Level: Set up assist    Lower Body Dressing/Undressing Lower body dressing      What is the patient wearing?: Pants     Lower body assist Assist for lower body dressing: Contact Guard/Touching assist     Toileting Toileting    Toileting assist Assist for toileting: Minimal Assistance - Patient > 75%     Transfers Chair/bed transfer  Transfers assist     Chair/bed transfer assist level: Supervision/Verbal cueing Chair/bed transfer assistive device: Walker, Clinical biochemist   Ambulation assist      Assist level: Supervision/Verbal cueing Assistive device: Walker-rolling Max distance: 4ft   Walk 10 feet activity  Assist     Assist level: Supervision/Verbal cueing Assistive device: Walker-rolling   Walk 50 feet activity   Assist Walk 50 feet with 2 turns activity did not occur: Safety/medical concerns (fatigue, LE weakness, decreased balance/postural control, deconditioning)  Assist level: Supervision/Verbal cueing Assistive device: Walker-rolling    Walk 150 feet activity   Assist Walk 150 feet activity did not occur: Safety/medical concerns (fatigue, LE weakness, decreased balance/postural control, deconditioning)         Walk 10 feet on uneven surface  activity   Assist Walk 10 feet on uneven surfaces activity did not occur: Safety/medical concerns (fatigue,  LE weakness, decreased balance/postural control, deconditioning)         Wheelchair     Assist Is the patient using a wheelchair?: Yes Type of Wheelchair: Manual Wheelchair activity did not occur: Safety/medical concerns (fatigue, LE weakness, decreased balance/postural control, deconditioning)         Wheelchair 50 feet with 2 turns activity    Assist    Wheelchair 50 feet with 2 turns activity did not occur: Safety/medical concerns (fatigue, LE weakness, decreased balance/postural control, deconditioning)       Wheelchair 150 feet activity     Assist  Wheelchair 150 feet activity did not occur: Safety/medical concerns (fatigue, LE weakness, decreased balance/postural control, deconditioning)       Blood pressure 124/66, pulse 75, temperature 98.7 F (37.1 C), temperature source Oral, resp. rate 17, height 5\' 7"  (1.702 m), weight 85 kg, SpO2 93 %.  Medical Problem List and Plan: 1.  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  Status post median sternotomy resection of fibroelastoma from aortic valve/CABG x4 08/04/2021.  Sternal precautions  Continue CIR therapies including PT, OT   -Interdisciplinary Team Conference today   2.  Impaired mobility, ambulation improved to 132 feet! -DVT/anticoagulation:  Pharmaceutical: continue Lovenox             -antiplatelet therapy: Continue Aspirin 325 mg daily 3. Post-stroke left hand pain: Continue Robaxin and tramadol as needed. Added B6 50mg  daily to improve sensitivity to pain. Decreased Gabapentin to 100mg  BID and 600mg  HS  Controlled on 9/27. Decrease Tramadol to 50 q12H PRN.  4. Depression/Anxiety: Continue Zoloft 100 mg daily, decrease xanax to daily PRN             -antipsychotic agents: N/A 5. Neuropsych: This patient is capable of making decisions on her own behalf. 6. Chest incision: Healing well, continue Routine skin checks 7. Fluids/Electrolytes/Nutrition: Routine in and  outs 8.ABLA. monitor weekly.   Hemoglobin stable 9.  Diabetes mellitus with peripheral neuropathy.  Hemoglobin A1c 7.8.  DiaBeta 5 mg daily, Glucophage 1000 mg twice daily PTA.   Glucophage 500 twice daily started on 9/18--observe for effect 9/19. Check Cr on Monday.  10  Hypertension.   Lasix 40 mg daily , hydralazine 10 mg nightly, Cozaar 100 mg daily, Lopressor 12.5 mg twice daily DC'd Continue Norvasc 10 Cozaar 50 restarted on 9/18--observe 9/19 11.  GERD.  Continue Protonix 12.  Restless leg syndrome.  Requip nightly--HELD dt lethargy 13.  Hyperlipidemia.  Lipitor 14.  Obesity.  BMI 30.83.  Dietary follow-up 15.  History of right thalamic infarction 5/22.  Received CIR.  Continue aspirin. 16. Left wrist drop: splint ordered 17. Vitamin D deficiency: start ergocalciferol 50,000U once per week  18. Suboptimal magnesium: started 250mg  magnesium gluconate HS. Conitnue magnesium oxide 400mg  BID  19. Dry mouth: stop Cymbalta. Improved 20. Dizziness/weakness: UA normal. CT ordered given  significant decline and risk factors for stroke- negative.  21. Leukocytosis: WBC up again 9/13, appreciate CT surgery following,  -s/p vac placement for sternal infection.  -Hemoglobin stable -transitioned to Ancef 22. Hyponatremia- lasix stopped 9/9    Na+ 135 on 9/14 and 9/19, monitor weekly.   -off of IVF 23. AKI: Resolved.  69. Urinary retention: counseled regarding importance of movement, preventing constipation.  Resolved. D/c bethanacol 15. Left hand swelling: continue to elevate, asked RN to apply ice.  16. Postsurgical chest pain: change Tylenol to Tylenol #3 q8H prn- improved. IV Fentanyl ordered for vac dressing changes.  17. Cracked lips: blistex PRN ordered. Asked nurse to provide her another tube.  18. Constipation: continue dulcolax, added colace 50mg  BID. Resolved  19. Right inner thigh subcutaneous nodules Patient states she noticed this a few days ago, nontender, not enlarging.   Suspect that these are small hematomas -discussed warm compresses, observation 20. Disposition: d/c home Fri 9/30  LOS:  29 days A FACE TO FACE EVALUATION WAS PERFORMED  Martha Clan P Giovanie Lefebre 09/09/2021, 12:28 PM

## 2021-09-09 NOTE — Progress Notes (Signed)
Physical Therapy Session Note  Patient Details  Name: [name] MRN: [id number] Date of Birth: 1956-11-02  Today's Date: 09/09/2021 PT Individual Time: [phone] and 1140-1154 PT Individual Time Calculation (min): 55 min and 14 min PT Missed Time: 16 minutes PT Missed Time Reason: toileting with previous patient  Short Term Goals: Week 3:  PT Short Term Goal 1 (Week 3): STG=LTG due to extended LOS due to medical complications Week 4:  PT Short Term Goal 1 (Week 4): STG=LTG due to extended LOS due to medical complications  Skilled Therapeutic Interventions/Progress Updates:   Treatment Session 1 Received pt supine in bed, pt agreeable to PT treatment, and denied any pain during session. Session with emphasis on functional mobility/transfers, generalized strengthening, dynamic standing balance/coordination, gait training, and improved activity tolerance. Donned non-skid socks with total A and pt transferred semi-reclined<>sitting EOB with supervision and took medications. Therapist styled pt's hair per pt request and pt transferred stand<>pivot bed<>TIS WC with RW and close supervision. Wound vac beeping with reports of leak. RN notified and reached out to wound care RN to come assess pt. Pt transported to/from room on 5C in TIS WC total A for time management purposes. Sit<>stands with RW and close supervision throughout session. Pt ambulated 29ft with RW and close supervision with total A to manage wound vac. Pt required min cues to ensure LUE was strapped into RW hand orthosis. Pt requested to go outside for fresh air and transported outside on entrance of [place] in TIS WC total A. Encouraged pt to walk outside, however pt politely declined due to foul smells from possible sewer leak. Transported around giftshop in [place] WC total A for change of scenery and encouraged functional use of LUE when reaching for objects - pt very pleased with functional gains in LUE strength. Pt required encouragement  to stay sitting in TIS [facility] for next OT session but finally agreed. Concluded session with pt sitting in TIS WC with all needs within reach.   Treatment Session 2 Received pt sitting in TIS [facility] with brother present at bedside, pt agreeable to PT treatment, and denied any pain during session but requested to return to bed. Session with emphasis on functional mobility/transfers and improved activity tolerance. Pt transferred TIS WC<>bed stand<>pivot with RW and close supervision. Again, pt forgetting to strap LUE to RW orthosis requiring min cues. Sit<>semi-reclined with supervision and provided pt with crackers per pt request. Concluded session with pt semi-reclined in bed, needs within reach, and bed alarm on. Pt's brother present at bedside. 16 minutes missed of skilled physical therapy due to toileting with previous patient.   Therapy Documentation Precautions:  Precautions Precautions: Fall, Sternal Restrictions Weight Bearing Restrictions: No Other Position/Activity Restrictions: sternal precautions  Therapy/Group: Individual Therapy Alfonse Alpers PT, DPT   09/09/2021, 7:22 AM

## 2021-09-10 LAB — GLUCOSE, CAPILLARY
Glucose-Capillary: 134 mg/dL — ABNORMAL HIGH (ref 70–99)
Glucose-Capillary: 121 mg/dL — ABNORMAL HIGH (ref 70–99)
Glucose-Capillary: 166 mg/dL — ABNORMAL HIGH (ref 70–99)
Glucose-Capillary: 116 mg/dL — ABNORMAL HIGH (ref 70–99)

## 2021-09-10 NOTE — Discharge Summary (Signed)
Physician Discharge Summary  Patient ID: [name] MRN: [id number] DOB/AGE: 67-Apr-1957 67 y.o.  Admit date: 08/11/2021 Discharge date: 09/12/2021  Discharge Diagnoses:  Principal Problem:   Debility Active Problems:   Acute blood loss anemia   Diabetic peripheral neuropathy (HCC)   Subcutaneous nodule DVT prophylaxis Mood stabilization Acute blood loss anemia Hypertension GERD Hyperlipidemia Obesity History of right thalamic infarction Urinary retention-resolved Superficial sternal wound infection  Discharged Condition: Stable  Significant Diagnostic Studies: DG Chest 2 View  Result Date: 08/22/2021 CLINICAL DATA:  Mid chest pain after PT. History of aortic valve tumor resection in a 65 year old female. EXAM: CHEST - 2 VIEW COMPARISON:  August 07, 2021. FINDINGS: Post median sternotomy for CABG. Heart size moderately enlarged and accentuated by AP technique. Linear airspace disease in the LEFT chest with improved aeration since previous imaging. No sign of lobar consolidation. No evidence of pleural effusion. No pneumothorax. On limited assessment there is no acute skeletal process. IMPRESSION: Moderate cardiomegaly. Linear airspace disease in the LEFT chest with improved aeration since previous imaging. Findings likely reflect atelectasis. Electronically Signed   By: Zetta Bills M.D.   On: 08/22/2021 15:56   CT HEAD WO CONTRAST (5MM)  Result Date: 08/22/2021 CLINICAL DATA:  Mental status changes. EXAM: CT HEAD WITHOUT CONTRAST TECHNIQUE: Contiguous axial images were obtained from the base of the skull through the vertex without intravenous contrast. COMPARISON:  04/18/2021 FINDINGS: Brain: There is no evidence for acute hemorrhage, hydrocephalus, mass lesion, or abnormal extra-axial fluid collection. No definite CT evidence for acute infarction. Vascular: No hyperdense vessel or unexpected calcification. Skull: No evidence for fracture. No worrisome lytic or sclerotic lesion.  Sinuses/Orbits: The visualized paranasal sinuses and mastoid air cells are clear. Visualized portions of the globes and intraorbital fat are unremarkable. Other: None. IMPRESSION: 1. Unremarkable study.  No acute intracranial abnormality. Electronically Signed   By: Misty Stanley M.D.   On: 08/22/2021 14:16   CT CHEST ABDOMEN PELVIS WO CONTRAST  Result Date: 08/23/2021 CLINICAL DATA:  Fever of unknown origin. Recent chest surgery, would beginning to open. EXAM: CT CHEST, ABDOMEN AND PELVIS WITHOUT CONTRAST TECHNIQUE: Multidetector CT imaging of the chest, abdomen and pelvis was performed following the standard protocol without IV contrast. COMPARISON:  None. FINDINGS: CT CHEST FINDINGS Cardiovascular: Changes from recent right breast surgery. Mildly enlarged heart. Small pericardial effusion measuring up to 7 mm in thickness. Calcific atherosclerotic disease of the coronary arteries. Mediastinum/Nodes: No enlarged mediastinal, hilar, or axillary lymph nodes. Thyroid gland, trachea, and esophagus demonstrate no significant findings. Lungs/Pleura: Hypoventilatory changes in bilateral lung bases. Musculoskeletal: Sequela of recent median sternotomy. No significant callus formation at the sternotomy line. Additionally, there is a soft tissue stranding anterior and posterior to the sternotomy with a small less than 2 cm phlegmon and gas collection in the anterior mediastinum. Small foci of gas are also seen anterior to the sternum within the areas of soft tissue stranding. CT ABDOMEN PELVIS FINDINGS Hepatobiliary: No focal liver abnormality is seen. No gallstones, gallbladder wall thickening, or biliary dilatation. Pancreas: Unremarkable. No pancreatic ductal dilatation or surrounding inflammatory changes. Spleen: Normal in size without focal abnormality. Adrenals/Urinary Tract: Adrenal glands are unremarkable. Kidneys are normal, without renal calculi, focal lesion, or hydronephrosis. Bladder is unremarkable.  Stomach/Bowel: Stomach is within normal limits. Appendix appears normal. No evidence of bowel wall thickening, distention, or inflammatory changes. Scattered colonic diverticulosis. Vascular/Lymphatic: Aortic atherosclerosis. No enlarged abdominal or pelvic lymph nodes. Reproductive: Uterus and bilateral adnexa are unremarkable. Other: No  abdominal wall hernia or abnormality. No abdominopelvic ascites. Musculoskeletal: Prior right hip arthroplasty. Spondylosis of the lumbosacral spine. IMPRESSION: 1. Sequela of recent median sternotomy. No significant callus formation at the sternotomy line. Additionally, there is a soft tissue stranding anterior and posterior to the sternotomy with a small less than 2 cm phlegmon and gas collection in the anterior mediastinum. Additional small foci of gas present anterior to the sternum within the areas of subcutaneous soft tissue stranding. These findings may represent postsurgical changes, however infection is also entirely possible. 2. Small pericardial effusion measuring up to 7 mm in thickness. 3. Calcific atherosclerotic disease of the coronary arteries. 4. No evidence of acute abnormalities within the abdomen or pelvis. 5. Scattered colonic diverticulosis without evidence of diverticulitis. 6. Aortic atherosclerosis. Aortic Atherosclerosis (ICD10-I70.0). Electronically Signed   By: Fidela Salisbury M.D.   On: 08/23/2021 14:57    Labs:  Basic Metabolic Panel: Recent Labs  Lab 09/08/21 0705  NA 135  K 4.4  CL 97*  CO2 27  GLUCOSE 159*  BUN 5*  CREATININE 0.76  CALCIUM 8.7*    CBC: Recent Labs  Lab 09/08/21 0705  WBC 7.1  NEUTROABS 4.7  HGB 10.3*  HCT 33.5*  MCV 88.2  PLT 360    CBG: Recent Labs  Lab 09/10/21 2105 09/11/21 0531 09/11/21 1201 09/11/21 1635 09/11/21 2116  GLUCAP 116* 130* 125* 128* 119*   Family history.  Brother with bone cancer mother with brain aneurysm Father with diabetes and CAD as well as heart failure and colon  cancer.  Paternal grandmother with dementia.  Negative for breast cancer esophageal cancer or rectal cancer  Brief HPI:   [name] is a 65 y.o. right-handed female with history of diabetes mellitus hypertension hyperlipidemia remote tobacco use obesity with BMI 30.83 anxiety depression as well as right thalamic infarction receiving CIR 04/29/2021 to 05/27/2021.  She was discharged home ambulating 200 feet contact-guard.  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  Per chart review lives with daughter 1 level home.  Presented 08/04/2021 with noted history findings of papillary fibroelastoma identified during work-up of CVA plan was for patient to complete inpatient rehab services of which she did.  She had a coronary CT which showed significant stenosis in all the coronaries with definite impairment of flow in the LAD and diagonal distribution and due to these findings patient underwent median sternotomy resection of fibroelastoma from aortic valve/CABG x4 08/04/2021 per Dr. Roxan Hockey.  Sternal precautions as indicated.  Hospital course acute blood loss anemia 7.7 transfused latest hemoglobin 10.4.  Maintained on aspirin as well for CABG.  Subcutaneous Lovenox for DVT prophylaxis.  Therapy evaluations completed due to patient decreased functional mobility was admitted for a comprehensive rehab program.   Hospital Course: [name] was admitted to rehab 08/11/2021 for inpatient therapies to consist of PT, ST and OT at least three hours five days a week. Past admission physiatrist, therapy team and rehab RN have worked together to provide customized collaborative inpatient rehab.  Pertain to patient's debility secondary to papillary fibroelastoma with significant stenosis status post median sternotomy resection of fibroelastoma from aortic valve/CABG x4 08/04/2021 with sternal precautions.  During course followed by CVTS noted  superficial sternal wound infection underwent incision debridement of sternal wound and application of wound VAC per Dr. Caffie Pinto.  Site continues to heal nicely wound VAC discontinued 09/11/2021 with wet-to-dry dressings as indicated.  She remained on  aspirin therapy as indicated.  Pain managed with use of scheduled Neurontin, scheduled lighted Derm, Robaxin and tramadol as needed.  Mood stabilization with Zoloft emotional support provided.  Acute blood loss anemia stable no bleeding episodes.  Diabetes mellitus hemoglobin A1c 7.8 maintained on Glucophage.  Blood pressure controlled on Norvasc as well as Cozaar.  Lipitor ongoing for hyperlipidemia.  Obesity BMI 30.83 dietary follow-up.  During her hospital stay she did have some dizziness weakness urinalysis negative CT follow-up of the head unremarkable.   Blood pressures were monitored on TID basis and soft and monitored  Diabetes has been monitored with ac/hs CBG checks and SSI was use prn for tighter BS control.    Rehab course: During patient's stay in rehab weekly team conferences were held to monitor patient's progress, set goals and discuss barriers to discharge. At admission, patient required minimal assist 15 feet rolling walker minimal assist sit to supine moderate assist sit to side-lying  Physical exam.  Blood pressure 109/62 pulse 82 temperature 99.5 respirations 18 oxygen saturation 94% room air Constitutional.  No acute distress HEENT Head.  Normocephalic and atraumatic Eyes.  Pupils round and reactive to light no discharge without nystagmus Neck.  Supple nontender no JVD without thyromegaly Cardiac regular rate rhythm late extra sounds Respiratory effort normal no respiratory distress without wheeze Abdomen.  Soft nontender positive bowel sounds without rebound Skin.  Midline chest incision with dressing intact Neurologic.  Alert oriented follows full commands  He/She  has had improvement in activity tolerance, balance, postural  control as well as ability to compensate for deficits. He/She has had improvement in functional use RUE/LUE  and RLE/LLE as well as improvement in awareness.  Sit to stand from elevated edge of bed rolling walker contact-guard assist.  Demonstrates improved adherence to sternal precautions.  Sessions focused on functional mobility generalized strengthening.  Ambulates with a rolling walker close supervision.  Patient completed lower body dressing with contact-guard assist and assistance to don support hose and socks.  Completed upper body bathing and dressing with set up.  Full family teaching completed plan discharged to home       Disposition: Discharged to home   Diet: Regular  Special Instructions: No driving smoking or alcohol  Moistened saline gauze covered with dry gauze and secured ABD pad and Tegaderm to allow patient to shower change twice daily  Medications at discharge. 1.  Tylenol as needed 2.  Xanax 0.5 mg p.o. daily as needed anxiety 3.  Norvasc 10 mg p.o. daily 4.  Aspirin 325 mg p.o. daily 5.  Lipitor 40 mg p.o. daily 6.  Neurontin 100 mg twice daily and 600 mg nightly 7.  Lidoderm patch as directed 8.  Cozaar 50 mg p.o. daily 9.  Magnesium oxide 400 mg p.o. twice daily 10.  Glucophage 500 mg p.o. twice daily 11.  Robaxin 500 mg twice daily as needed muscle spasms 12.  Multivitamin daily 13.  Protonix 40 mg p.o. daily 14.  MiraLAX twice daily hold for loose stools 15.  Vitamin B6 50 mg p.o. daily 16.  Zoloft 100 mg p.o. daily 17.  Tramadol 50 mg every 12 hours as needed moderate pain 18.  Vitamin D 50,000 units every 7 days 19.  Lasix 40 mg daily 20.  K dur20 mEq daily 21.  DiaBeta 5 mg daily    30-35 minutes were spent completing discharge summary and discharge planning  Discharge Instructions     Ambulatory referral to Physical Medicine Rehab   Complete  by: As directed    Moderate complexity follow-up 1 to 2 weeks debility/CABG        Follow-up  Information     Raulkar, Clide Deutscher, MD Follow up.   Specialty: Physical Medicine and Rehabilitation Why: 09/26/21 please arrive at 11:20pm for 11:40pm appointment, thank you! Contact information: [address] [place] [address] [phone]         [name], MD Follow up.   Specialty: Cardiothoracic Surgery Why: Call for appointment Contact information: [address] [place] [address] [phone]         Werner Lean, MD Follow up.   Specialty: Cardiology Why: Call for appointment Contact information: [place] [address] [phone]                 Signed: Cathlyn Parsons 09/12/2021, 5:12 AM

## 2021-09-10 NOTE — Progress Notes (Signed)
PROGRESS NOTE   Subjective/Complaints: Having panic attacks that wound vac will not be removed tomorrow.  +left hand neuropathic pain Did well with therapy this morning- worked on stairs  ROS: Patient denies fever, rash, sore throat, blurred vision, nausea, vomiting, diarrhea, cough, shortness of breath or joint or back pain, headache, or mood change, constipation. +neuropathic pain in left hand, +chest pain after vac change, +hiccups  Objective:   No results found. Recent Labs    09/08/21 0705  WBC 7.1  HGB 10.3*  HCT 33.5*  PLT 360     Recent Labs    09/08/21 0705  NA 135  K 4.4  CL 97*  CO2 27  GLUCOSE 159*  BUN 5*  CREATININE 0.76  CALCIUM 8.7*       Intake/Output Summary (Last 24 hours) at 09/10/2021 1001 Last data filed at 09/10/2021 0700 Gross per 24 hour  Intake 360 ml  Output 50 ml  Net 310 ml   Physical Exam: Vital Signs Blood pressure (!) 144/73, pulse 89, temperature 98.2 F (36.8 C), temperature source Oral, resp. rate 16, height 5\' 7"  (1.702 m), weight 89.3 kg, SpO2 94 %. Gen: no distress, normal appearing HEENT: oral mucosa pink and moist, NCAT Cardio: Reg rate Chest: normal effort, normal rate of breathing Abd: soft, non-distended Ext: no edema Psych: pleasant, normal affect  Skin: vac to chest with s/s drainage (scant) Musc: No edema in extremities.  No tenderness in extremities. Relatively large, slightly firm, nontender nodule on right inner thigh Neuro: Alert and oriented Motor: Residual left sided hemiparesis from prior stroke, wrist drop, no changes. 5/5 throughout but 0/5 left wrist extension, 3/5 left wrist hand grip.   Assessment/Plan: 1. Functional deficits which require 3+ hours per day of interdisciplinary therapy in a comprehensive inpatient rehab setting. Physiatrist is providing close team supervision and 24 hour management of active medical problems listed  below. Physiatrist and rehab team continue to assess barriers to discharge/monitor patient progress toward functional and medical goals  Care Tool:  Bathing    Body parts bathed by patient: Right arm, Left arm, Chest, Abdomen, Front perineal area, Face, Left lower leg, Right lower leg, Left upper leg, Right upper leg, Buttocks   Body parts bathed by helper: Buttocks     Bathing assist Assist Level: Contact Guard/Touching assist     Upper Body Dressing/Undressing Upper body dressing   What is the patient wearing?: Pull over shirt    Upper body assist Assist Level: Set up assist    Lower Body Dressing/Undressing Lower body dressing      What is the patient wearing?: Pants     Lower body assist Assist for lower body dressing: Contact Guard/Touching assist     Toileting Toileting    Toileting assist Assist for toileting: Minimal Assistance - Patient > 75%     Transfers Chair/bed transfer  Transfers assist     Chair/bed transfer assist level: Supervision/Verbal cueing Chair/bed transfer assistive device: Walker, Clinical biochemist   Ambulation assist      Assist level: Supervision/Verbal cueing Assistive device: Walker-rolling Max distance: 59ft   Walk 10 feet activity   Assist  Assist level: Supervision/Verbal cueing Assistive device: Walker-rolling   Walk 50 feet activity   Assist Walk 50 feet with 2 turns activity did not occur: Safety/medical concerns (fatigue, LE weakness, decreased balance/postural control, deconditioning)  Assist level: Supervision/Verbal cueing Assistive device: Walker-rolling    Walk 150 feet activity   Assist Walk 150 feet activity did not occur: Safety/medical concerns (fatigue, LE weakness, decreased balance/postural control, deconditioning)         Walk 10 feet on uneven surface  activity   Assist Walk 10 feet on uneven surfaces activity did not occur: Safety/medical concerns (fatigue,  LE weakness, decreased balance/postural control, deconditioning)         Wheelchair     Assist Is the patient using a wheelchair?: Yes Type of Wheelchair: Manual Wheelchair activity did not occur: Safety/medical concerns (fatigue, LE weakness, decreased balance/postural control, deconditioning)         Wheelchair 50 feet with 2 turns activity    Assist    Wheelchair 50 feet with 2 turns activity did not occur: Safety/medical concerns (fatigue, LE weakness, decreased balance/postural control, deconditioning)       Wheelchair 150 feet activity     Assist  Wheelchair 150 feet activity did not occur: Safety/medical concerns (fatigue, LE weakness, decreased balance/postural control, deconditioning)       Blood pressure (!) 144/73, pulse 89, temperature 98.2 F (36.8 C), temperature source Oral, resp. rate 16, height 5\' 7"  (1.702 m), weight 89.3 kg, SpO2 94 %.  Medical Problem List and Plan: 1.  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  Status post median sternotomy resection of fibroelastoma from aortic valve/CABG x4 08/04/2021.  Sternal precautions  Continue CIR therapies including PT, OT  2.  Impaired mobility, ambulation improved to 132 feet! -DVT/anticoagulation:  Pharmaceutical: continue Lovenox             -antiplatelet therapy: Continue Aspirin 325 mg daily 3. Post-stroke left hand pain: Continue Robaxin and tramadol as needed. Added B6 50mg  daily to improve sensitivity to pain. Decreased Gabapentin to 100mg  BID and 600mg  HS  Controlled on 9/27. Decrease Tramadol to 50 q12H PRN.   Discussed trialing CBD oil outpatient once she is off of opioids 4. Depression/Anxiety: Continue Zoloft 100 mg daily, decrease xanax to daily PRN             -antipsychotic agents: N/A 5. Neuropsych: This patient is capable of making decisions on her own behalf. 6. Chest incision: Healing well, continue Routine skin checks 7.  Fluids/Electrolytes/Nutrition: Routine in and outs 8.ABLA. monitor weekly.   Hemoglobin stable 9.  Diabetes mellitus with peripheral neuropathy.  Hemoglobin A1c 7.8.  DiaBeta 5 mg daily, Glucophage 1000 mg twice daily PTA.   Glucophage 500 twice daily started on 9/18--observe for effect 9/19. Check Cr on Monday.  10  Hypertension.   Lasix 40 mg daily , hydralazine 10 mg nightly, Cozaar 100 mg daily, Lopressor 12.5 mg twice daily DC'd Continue Norvasc 10 Cozaar 50 restarted on 9/18--observe 9/19 11.  GERD.  Continue Protonix 12.  Restless leg syndrome.  Requip nightly--HELD dt lethargy 13.  Hyperlipidemia.  Lipitor 14.  Obesity.  BMI 30.83.  Dietary follow-up 15.  History of right thalamic infarction 5/22.  Received CIR.  Continue aspirin. 16. Left wrist drop: splint ordered 17. Vitamin D deficiency: start ergocalciferol 50,000U once per week  18. Suboptimal magnesium: started 250mg  magnesium gluconate HS. Conitnue magnesium oxide 400mg  BID  19. Dry mouth: stop Cymbalta. Improved 20. Dizziness/weakness: UA normal. CT ordered  given significant decline and risk factors for stroke- negative.  21. Leukocytosis: WBC up again 9/13, appreciate CT surgery following,  -s/p vac placement for sternal infection.  -Hemoglobin stable -transitioned to Ancef 22. Hyponatremia- lasix stopped 9/9    Na+ 135 on 9/14 and 9/19, monitor weekly.   -off of IVF 23. AKI: Resolved.  25. Urinary retention: counseled regarding importance of movement, preventing constipation.  Resolved. D/c bethanacol 15. Left hand swelling: continue to elevate, asked RN to apply ice.  16. Postsurgical chest pain: change Tylenol to Tylenol #3 q8H prn- improved. IV Fentanyl ordered for vac dressing changes.  17. Cracked lips: blistex PRN ordered. Asked nurse to provide her another tube.  18. Constipation: continue dulcolax, added colace 50mg  BID. Resolved  19. Right inner thigh subcutaneous nodules Patient states she noticed this a  few days ago, nontender, not enlarging.  Suspect that these are small hematomas -discussed warm compresses, observation 20. Panic attacks regarding her vac not being removed tomorrow: assured her that it likely will be, and if it isn't we will try to extend her until vac is removed 20. Disposition: d/c home Fri 9/30  LOS:  30 days A FACE TO FACE EVALUATION WAS PERFORMED  Jaydon Soroka P Novali Vollman 09/10/2021, 10:01 AM

## 2021-09-10 NOTE — Progress Notes (Signed)
Patient ID: [name], female   DOB: 1956/04/29, 65 y.o.   MRN: [id number]  Met with pt to update regarding team conference progress this week toward goals of supervision. She will mer supervision goals by Friday and have wound vac removed Thursday prior to discharge. She feels good about going home and has no worries regarding her care. Have contacted [facility] to resume services and pt has all needed equipment from past admission

## 2021-09-10 NOTE — Progress Notes (Signed)
Occupational Therapy Discharge Summary  Patient Details  Name: [name] MRN: [id number] Date of Birth: [birth year]   Patient has met 9 of 9 long term goals due to improved activity tolerance, improved balance, postural control, ability to compensate for deficits, and improved awareness.  Patient to discharge at overall Supervision level.  Patient's care partner is independent to provide the necessary  supervision  assistance at discharge.  Patient to d/c home with daughter who will be able to provide 24/7 supervision initially.  Reasons goals not met: N/A  Recommendation:  Patient will benefit from ongoing skilled OT services in home health setting to continue to advance functional skills in the area of BADL, iADL, and Reduce care partner burden.  Equipment: No equipment provided  Reasons for discharge: treatment goals met and discharge from hospital  Patient/family agrees with progress made and goals achieved: Yes  OT Discharge Precautions/Restrictions  Precautions Precautions: Fall;Sternal Restrictions Other Position/Activity Restrictions: sternal precautions Pain Pain Assessment Pain Scale: 0-10 Pain Score: 2  Pain Type: Acute pain Pain Location: Chest ADL ADL Eating: Independent Grooming: Setup Where Assessed-Grooming: Sitting at sink Upper Body Bathing: Supervision/safety, Setup Where Assessed-Upper Body Bathing: Edge of bed Lower Body Bathing: Supervision/safety Where Assessed-Lower Body Bathing: Edge of bed Upper Body Dressing: Supervision/safety, Setup Where Assessed-Upper Body Dressing: Edge of bed Lower Body Dressing: Supervision/safety Where Assessed-Lower Body Dressing: Edge of bed Toileting: Supervision/safety Where Assessed-Toileting: Bedside Commode Toilet Transfer: Close supervision Toilet Transfer Method: Counselling psychologist: Geophysical data processor: Close supervision Social research officer, government Method:  Ambulating, Stand pivot Youth worker: Shower seat with back Vision Baseline Vision/History: 1 Wears glasses (reading) Patient Visual Report: No change from baseline Vision Assessment?: No apparent visual deficits Perception  Perception: Within Functional Limits Praxis Praxis: Intact Cognition Overall Cognitive Status: Within Functional Limits for tasks assessed Arousal/Alertness: Awake/alert Orientation Level: Oriented X4 Year: 2022 Month: September Day of Week: Correct Attention: Selective;Alternating Selective Attention: Appears intact Alternating Attention: Appears intact Memory: Appears intact Immediate Memory Recall: Sock;Blue;Bed Memory Recall Sock: Without Cue Memory Recall Blue: Without Cue Memory Recall Bed: Without Cue Awareness: Appears intact Problem Solving: Appears intact Safety/Judgment: Appears intact Comments: requires occasional cues to maintain sternal precautions Sensation Sensation Light Touch: Impaired by gross assessment Proprioception: Appears Intact Additional Comments: decreased sensation LLE>RLE and reported feeling "tingly" Coordination Gross Motor Movements are Fluid and Coordinated: No Fine Motor Movements are Fluid and Coordinated: No Coordination and Movement Description: mild uncoordination due to L hemiparesis from previous CVA, difficulty maintaining sternal precautions, fatigue, generalized weakness/deconditioning, intermittent pain at sternal incision, and decreased balance/postural control. Finger Nose Finger Test: WFL on RUE, mild dysmetria and tremors on LUE Heel Shin Test: [facility] bilaterally Motor  Motor Motor: Hemiplegia;Abnormal postural alignment and control Motor - Skilled Clinical Observations: mild uncoordination due to L hemiparesis from previous CVA, difficulty maintaining sternal precautions, fatigue, generalized weakness/deconditioning, intermittent pain at sternal incision, and decreased balance/postural  control. Mobility  Bed Mobility Bed Mobility: Rolling Right;Rolling Left;Sit to Supine;Supine to Sit Rolling Right: Supervision/verbal cueing Rolling Left: Supervision/Verbal cueing Supine to Sit: Supervision/Verbal cueing Sit to Supine: Supervision/Verbal cueing Transfers Sit to Stand: Supervision/Verbal cueing Stand to Sit: Supervision/Verbal cueing  Trunk/Postural Assessment  Cervical Assessment Cervical Assessment: Exceptions to [facility] (forward head) Thoracic Assessment Thoracic Assessment: Exceptions to [facility] (rounded shoulders) Lumbar Assessment Lumbar Assessment: Exceptions to [facility] (posterior pelvic tilt) Postural Control Postural Control: Deficits on evaluation  Balance Balance Balance Assessed: Yes Static Sitting Balance Static Sitting - Balance Support: Feet  supported;Bilateral upper extremity supported Static Sitting - Level of Assistance: 7: Independent Dynamic Sitting Balance Dynamic Sitting - Balance Support: Feet supported;No upper extremity supported Dynamic Sitting - Level of Assistance: 6: Modified independent (Device/Increase time) Static Standing Balance Static Standing - Balance Support: Bilateral upper extremity supported (RW) Static Standing - Level of Assistance: 5: Stand by assistance (supervision) Dynamic Standing Balance Dynamic Standing - Balance Support: Bilateral upper extremity supported (RW) Dynamic Standing - Level of Assistance: 5: Stand by assistance (supervision) Extremity/Trunk Assessment RUE Assessment RUE Assessment: Within Functional Limits LUE Assessment LUE Assessment: Exceptions to [facility] Active Range of Motion (AROM) Comments: WNL, requires increased time due to L hemiplegia but able to access full ROM, increased time and effort with LUE [facility] General Strength Comments: grossly 3/5   Julio Storr 09/12/2021, 9:11 AM

## 2021-09-10 NOTE — Progress Notes (Signed)
Physical Therapy Session Note  Patient Details  Name: [name] MRN: [id number] Date of Birth: June 01, 1956  Today's Date: 09/10/2021 PT Individual Time: 0900-0954 PT Individual Time Calculation (min): 54 min   Short Term Goals: Week 3:  PT Short Term Goal 1 (Week 3): STG=LTG due to extended LOS due to medical complications Week 4:  PT Short Term Goal 1 (Week 4): STG=LTG due to extended LOS due to medical complications  Skilled Therapeutic Interventions/Progress Updates:   Received pt supine in bed, pt agreeable to PT treatment, and denied any pain during session but reported "soreness" at sternal incision and mild nausea from taking medication on an empty stomach. RN notified and present to administer anti-nausea medication. Also provided pt with saltine crackers and ginger ale. Session with emphasis on functional mobility/transfers, toileting, generalized strengthening, dynamic standing balance/coordination, simulated car transfers, stair navigation, and improved activity tolerance. Pt transferred semi-reclined<>sitting EOB with HOB elevated and supervision. Pt reported urge to toilet and ambulated 38ft x 2 trials with RW and close supervision to/from bathroom with cues to fasten LUE to RW hand orthosis as pt frequently forgetting. Pt able to manage clothing, void, and perform peri-care with supervision. Pt transported to/from room on 5C in TIS WC total A for time management purposes. Pt performed simulated car transfer with RW and close supervision and ambulated 46ft on uneven surfaces (ramp) with RW and supervision with cues for eccentric control when descending. Pt then navigated 4 steps with 2 rails and CGA ascending and descending with a step to pattern. Pt able to recall "up with the good, down with the bad" technique without cues. Pt with one instance of LUE slipping off handrail but able to quickly self-correct. Pt reported feeling anxious about wound vac removal tomorrow; provided  emotional support and encouragement and therapeutic listening. Pt requested to return to bed and transferred TIS WC<>bed with RW and supervision and sit<>semi-reclined with supervision while adhering to sternal precautions. Concluded session with pt semi-reclined in bed, needs within reach, and bed alarm on. MD present at bedside for morning rounds.   Therapy Documentation Precautions:  Precautions Precautions: Fall, Sternal Restrictions Weight Bearing Restrictions: No Other Position/Activity Restrictions: sternal precautions  Therapy/Group: Individual Therapy Alfonse Alpers PT, DPT   09/10/2021, 7:14 AM

## 2021-09-10 NOTE — Consult Note (Signed)
   [facility] Inpatient Consult   09/10/2021  DAWNITA MOLNER [birth year] [phone]   [facility] [ACO] Patient:  [place] plan  Post inpatient rehab:  Follow up on disposition needs  Patient is currently assigned for a [place] for the [place] for focused to offer support for disease management and community resource support.     Plan: Patient will be followed by [place] Coordinator.   For additional questions or referrals please contact:   Natividad Brood, RN BSN [facility] Liaison  [phone] business mobile phone Toll free office [phone]  Fax number: [phone] [email or web address]

## 2021-09-10 NOTE — Progress Notes (Signed)
Occupational Therapy Session Note  Patient Details  Name: [name] MRN: [id number] Date of Birth: October 24, 1956  Today's Date: 09/10/2021 OT Individual Time: 1050-1200 and 1402-1502 OT Individual Time Calculation (min): 70 min and 60 min   Short Term Goals: Week 4:  OT Short Term Goal 1 (Week 4): STG = LTGs due to remaining LOS  Skilled Therapeutic Interventions/Progress Updates:    1) Treatment session with focus on pain management and functional use of LUE.  Pt received supine in bed reporting fatigued and feeling nauseous and refusing OOB activity.  Pt with continued c/o pain in L hand.  Therapist performed retrograde massage to digits and hand on LUE due to edema and "stiffness".  Therapist applied kinesiotape to L digits and hand to facilitate movement of edema.  Educated on positioning of hand for edema management.  Therapist directed pt in gross grasp activity with foam block for ROM and strengthening.  Pt reports need to toilet.  Pt completed bed mobility with supervision and ambulated to toilet with RW with CGA.  Pt completed toileting task with supervision and returned to bed supervision. Pt remained semi-reclined in bed with all needs in reach.  Therapist reached out to additional OT support in regards to immediate and future therapy supports and treatment ideas for continued pain in LUE.   2) Treatment session with focus on self-care retraining, functional transfers, and LUE edema management.  Pt received semi-reclined in bed reporting feeling better than this AM.  Pt completed bed mobility supervision and ambulated to toilet with RW with supervision.  Pt completed toileting tasks with supervision and returned to EOB.  Pt completed bathing and dressing from EOB with setup for items and then supervision during sit > stand.  Noted kinesiotape falling off of L hand, therefore therapist removed it and applied coban taping to digits and hand for edema management.  Pt returned to  semi-reclined and left with all needs in reach.  Therapy Documentation Precautions:  Precautions Precautions: Fall, Sternal Restrictions Weight Bearing Restrictions: No Other Position/Activity Restrictions: sternal precautions  Pain:  Pt with c/o pain in L hand, not rated. Premedicated.    Therapy/Group: Individual Therapy  Simonne Come 09/10/2021, 12:37 PM

## 2021-09-11 ENCOUNTER — Other Ambulatory Visit (HOSPITAL_COMMUNITY): Payer: Self-pay

## 2021-09-11 LAB — GLUCOSE, CAPILLARY
Glucose-Capillary: 130 mg/dL — ABNORMAL HIGH (ref 70–99)
Glucose-Capillary: 125 mg/dL — ABNORMAL HIGH (ref 70–99)
Glucose-Capillary: 128 mg/dL — ABNORMAL HIGH (ref 70–99)
Glucose-Capillary: 119 mg/dL — ABNORMAL HIGH (ref 70–99)

## 2021-09-11 MED ORDER — METFORMIN HCL 500 MG PO TABS
500.0000 mg | ORAL_TABLET | Freq: Two times a day (BID) | ORAL | 0 refills | Status: DC
  Filled 2021-09-11 – 2021-10-30 (×2): qty 60, 30d supply, fill #0

## 2021-09-11 MED ORDER — ALPRAZOLAM 0.5 MG PO TABS
0.5000 mg | ORAL_TABLET | Freq: Every day | ORAL | 0 refills | Status: DC | PRN
  Filled 2021-09-11: qty 30, 30d supply, fill #0

## 2021-09-11 MED ORDER — POLYETHYLENE GLYCOL 3350 17 G PO PACK: 17.0000 g | PACK | Freq: Two times a day (BID) | ORAL | 0 refills | Status: DC

## 2021-09-11 MED ORDER — POTASSIUM CHLORIDE CRYS ER 20 MEQ PO TBCR
20.0000 meq | EXTENDED_RELEASE_TABLET | Freq: Every day | ORAL | 0 refills | Status: DC
  Filled 2021-09-11: qty 30, 30d supply, fill #0

## 2021-09-11 MED ORDER — ROPINIROLE HCL 0.5 MG PO TABS
0.5000 mg | ORAL_TABLET | Freq: Every day | ORAL | 0 refills | Status: DC
  Filled 2021-09-11: qty 30, 30d supply, fill #0

## 2021-09-11 MED ORDER — GABAPENTIN 100 MG PO CAPS
100.0000 mg | ORAL_CAPSULE | Freq: Two times a day (BID) | ORAL | 0 refills | Status: DC
  Filled 2021-09-11: qty 60, 30d supply, fill #0

## 2021-09-11 MED ORDER — AMLODIPINE BESYLATE 10 MG PO TABS
10.0000 mg | ORAL_TABLET | Freq: Every day | ORAL | 0 refills | Status: DC
  Filled 2021-09-11 – 2021-10-30 (×2): qty 30, 30d supply, fill #0

## 2021-09-11 MED ORDER — VITAMIN D (ERGOCALCIFEROL) 1.25 MG (50000 UNIT) PO CAPS
50000.0000 [IU] | ORAL_CAPSULE | ORAL | 0 refills | Status: DC
  Filled 2021-09-11: qty 5, 35d supply, fill #0

## 2021-09-11 MED ORDER — FUROSEMIDE 40 MG PO TABS
40.0000 mg | ORAL_TABLET | Freq: Every day | ORAL | 0 refills | Status: DC
  Filled 2021-09-11: qty 30, 30d supply, fill #0

## 2021-09-11 MED ORDER — ACETAMINOPHEN-CODEINE #3 300-30 MG PO TABS
1.0000 | ORAL_TABLET | Freq: Two times a day (BID) | ORAL | Status: DC | PRN
Start: 2021-09-11 — End: 2021-09-12
  Administered 2021-09-11: 1 via ORAL
  Filled 2021-09-11 (×2): qty 1

## 2021-09-11 MED ORDER — PYRIDOXINE HCL 50 MG PO TABS
50.0000 mg | ORAL_TABLET | Freq: Every day | ORAL | 0 refills | Status: DC
  Filled 2021-09-11: qty 30, 30d supply, fill #0

## 2021-09-11 MED ORDER — METHOCARBAMOL 500 MG PO TABS
500.0000 mg | ORAL_TABLET | Freq: Two times a day (BID) | ORAL | 0 refills | Status: DC | PRN
  Filled 2021-09-11: qty 60, 30d supply, fill #0

## 2021-09-11 MED ORDER — GABAPENTIN 300 MG PO CAPS
600.0000 mg | ORAL_CAPSULE | Freq: Every day | ORAL | 0 refills | Status: DC
  Filled 2021-09-11: qty 60, 30d supply, fill #0

## 2021-09-11 MED ORDER — SERTRALINE HCL 100 MG PO TABS
100.0000 mg | ORAL_TABLET | Freq: Every day | ORAL | 0 refills | Status: DC
  Filled 2021-09-11: qty 30, 30d supply, fill #0

## 2021-09-11 MED ORDER — LIDOCAINE 5 % EX PTCH
1.0000 | MEDICATED_PATCH | CUTANEOUS | 0 refills | Status: DC
  Filled 2021-09-11: qty 30, 30d supply, fill #0

## 2021-09-11 MED ORDER — TRAMADOL HCL 50 MG PO TABS
50.0000 mg | ORAL_TABLET | Freq: Two times a day (BID) | ORAL | 0 refills | Status: DC | PRN
  Filled 2021-09-11: qty 30, 15d supply, fill #0

## 2021-09-11 MED ORDER — ATORVASTATIN CALCIUM 40 MG PO TABS
40.0000 mg | ORAL_TABLET | Freq: Every day | ORAL | 0 refills | Status: DC
  Filled 2021-09-11 – 2021-10-30 (×2): qty 30, 30d supply, fill #0

## 2021-09-11 MED ORDER — MAGNESIUM OXIDE 400 MG PO TABS
400.0000 mg | ORAL_TABLET | Freq: Two times a day (BID) | ORAL | 0 refills | Status: DC
  Filled 2021-09-11: qty 60, 30d supply, fill #0

## 2021-09-11 MED ORDER — PANTOPRAZOLE SODIUM 40 MG PO TBEC
40.0000 mg | DELAYED_RELEASE_TABLET | Freq: Every day | ORAL | 0 refills | Status: DC
  Filled 2021-09-11 – 2021-10-30 (×2): qty 30, 30d supply, fill #0

## 2021-09-11 MED ORDER — LOSARTAN POTASSIUM 50 MG PO TABS
50.0000 mg | ORAL_TABLET | Freq: Every day | ORAL | 0 refills | Status: DC
  Filled 2021-09-11: qty 30, 30d supply, fill #0

## 2021-09-11 MED ORDER — GLYBURIDE 5 MG PO TABS
5.0000 mg | ORAL_TABLET | Freq: Every day | ORAL | 0 refills | Status: DC
  Filled 2021-09-11 – 2021-10-30 (×2): qty 30, 30d supply, fill #0

## 2021-09-11 MED ORDER — DOCUSATE SODIUM 50 MG PO CAPS: 50.0000 mg | ORAL_CAPSULE | Freq: Two times a day (BID) | ORAL | 0 refills | Status: DC

## 2021-09-11 NOTE — Progress Notes (Signed)
Discharge-- Inpatient Rehabilitation Medication Review by a Pharmacist  A complete drug regimen review was completed for this patient to identify any potential clinically significant medication issues.  High Risk Drug Classes Is patient taking? Indication by Medication  Antipsychotic No   Anticoagulant No   Antibiotic No   Opioid Yes Tramadol: L handpain, surgical wound pain  Antiplatelet Yes CAD s/p cABG  Hypoglycemics/insulin Yes Glyburide, metformin, SEmglee for DM  Vasoactive Medication Yes Amlodipine, losartan for HTN, CAD  Chemotherapy No   Other No      Clinically significant medication issues were identified that warrant physician communication and completion of prescribed/recommended actions by midnight of the next day:  No  Time spent performing this drug regimen review (minutes):  20min  Kelaiah Escalona S. Alford Highland, PharmD, BCPS Clinical Staff Pharmacist [email or web address] [place] 09/11/2021 12:27 PM

## 2021-09-11 NOTE — Progress Notes (Signed)
Physical Therapy Session Note  Patient Details  Name: [name] MRN: [id number] Date of Birth: 1956-04-07  Today's Date: 09/11/2021 PT Individual Time: 7654-6503 and 1300-1402 PT Individual Time Calculation (min): 39 min and 62 min PT Missed Time: 13 minutes PT Missed Time Reason: Fatigue  Short Term Goals: Week 3:  PT Short Term Goal 1 (Week 3): STG=LTG due to extended LOS due to medical complications Week 4:  PT Short Term Goal 1 (Week 4): STG=LTG due to extended LOS due to medical complications  Skilled Therapeutic Interventions/Progress Updates:   Treatment Session 1 Received pt supine in bed asleep, pt easily aroused and agreeable to PT treatment, and denied any pain during session but reported soreness at sternal incision from wound vac removal. Session with emphasis on discharge planning, functional mobility/transfers, generalized strengthening, dynamic standing balance/coordination, gait training, and improved activity tolerance. Pt transferred semi-reclined<>sitting EOB with HOB elevated and use of bedrails with supervision with caution to adhere to sternal precautions. Brushed teeth sitting EOB with set up assist. Sit<>stand with RW and supervision and pt ambulated 153ft with RW and supervision. Pt demonstrates generalized unsteadiness, decreased bilateral foot clearance, and decreased cadence. Sit<>semi-reclined with supervision. Concluded session with pt semi-reclined in bed, needs within reach, and bed alarm on.   Treatment Session 2 Received pt supine in bed, pt agreeable to PT treatment, and did not state pain level during session. Session with emphasis on functional mobility/transfers, toileting, generalized strengthening, dynamic standing balance/coordination, ambulation, and improved activity tolerance. Pt transferred semi-reclined <>sitting EOB with HOB elevated and supervision with caution to maintain sternal precautions - pt continues to require min cues overall.  Sit<>stand with RW and supervision and ambulated 38ft x 2 trials with RW and supervision to/from bathroom. Pt able to manage clothing, void, and perform hygiene management with supervision. Pt requested to go outside and transported outside to entrance of [place] in TIS WC total A to uplift mood/spirits. Pt appreciative of time spent outside but politely declined ambulating due to fatigue. Pt reported feeling ready for D/C tomorrow and feels confident that her son/daughter can provide assist. Pt then transported around [employer] in TIS WC dependently and reported enjoying "just looking around". In dayroom, pt performed seated BLE strengthening on Kinetron at 20 cm/sec for 1 minute x 4 trials with emphasis on quad/glute strengthening. Provided pt with HEP and educated pt on frequency/duration/technique for the following exercises: -Supine Bridge - 1 x daily - 7 x weekly - 3 sets - 10 reps -Supine Active Straight Leg Raise - 1 x daily - 7 x weekly - 3 sets - 10 reps -Bent Knee Fallouts - 1 x daily - 7 x weekly - 3 sets - 10 reps -Seated March - 1 x daily - 7 x weekly - 3 sets - 10 reps -Seated Long Arc Quad - 1 x daily - 7 x weekly - 3 sets - 10 reps Pt transported back to room and requested to to return to bed. TIS WC<>bed stand<>pivot with RW and supervision and sit<>supine with supervision. Concluded session with pt supine in bed, needs within reach, and bed alarm on. 13 minutes missed of skilled physical therapy due to fatigue.   Therapy Documentation Precautions:  Precautions Precautions: Fall, Sternal Restrictions Weight Bearing Restrictions: No Other Position/Activity Restrictions: sternal precautions  Therapy/Group: Individual Therapy Alfonse Alpers PT, DPT   09/11/2021, 7:27 AM

## 2021-09-11 NOTE — Progress Notes (Addendum)
Occupational Therapy Session Note  Patient Details  Name: [name] MRN: [id number] Date of Birth: 1956-01-08  Today's Date: 09/11/2021 OT Individual Time: [id number] first session  OT Individual Time Calculation (min): 60 min  OT Individual Time: 1430-1505 second session  OT Individual Time Calculation (min): 35 min   Short Term Goals: Week 3:  OT Short Term Goal 1 (Week 3): STG = LTGs due to ELOS OT Short Term Goal 1 - Progress (Week 3): Progressing toward goal  Skilled Therapeutic Interventions/Progress Updates:  First session:Pt greeted supine in bed agreeable to OT intervention. Session focus on BADL reeducation and [facility] therex for LUE. Pt completed bed mobility with Supervision. Supervision for sit<>stand from EOB with Rw and CGA for ambulatory shower transfer to walkin shower with RW. MIN A to step over threshold of shower. Pt completed bathing with supervision from shower seat. Pt ambulated out of shower with more of MIN A for safety. Pt completed dressing from EOB with overall s/u assist. Pt returned to supine with supervision. Remainder of session to focus on implementing [facility] theresx for LUE. Pt completed therex as indicated below with pt completing x5 reps of each.  Thumb opposition Finger spreading MP flexion/extension Compostie flexion/extnsion  Digit lifts PIP flexion/extension  Pen hurdles DIP flexion extension Issued HEP to increase carryover. Pt left supine in bed with bed alarm activated and all needs within reach.   Second session: Pt greeted supine in bed with pt agreeable to OT intervention. Pt request to work on her L hand. Session focus on therapeutic activities focused on increased strength and AROM of L hand. Pt completed functional grasp and release task with weighted clothespins to facilitate improved strength and coordination. Pt required cues to utilize all digits as pt attempting to only utilize thumb and pointer finger. Noted increased edema in L hand as  well, therefore applied K-tape to dorsal aspect of pts hand as edema mgmt strategy. Pt left supine in bed with bed alarm bed alarm activated.   Therapy Documentation Precautions:  Precautions Precautions: Fall, Sternal Restrictions Weight Bearing Restrictions: No Other Position/Activity Restrictions: sternal precautions   Pain: Pt reports no pain during session   Therapy/Group: Individual Therapy  Precious Haws 09/11/2021, 12:57 PM

## 2021-09-11 NOTE — Progress Notes (Signed)
Physical Therapy Discharge Summary  Patient Details  Name: [name] MRN: [id number] Date of Birth: 03/06/1956  Patient has met 8 of 8 long term goals due to improved activity tolerance, improved balance, improved postural control, increased strength, decreased pain, ability to compensate for deficits, functional use of  left upper extremity and left lower extremity, improved awareness, and improved coordination. Patient to discharge at an ambulatory level Supervision. Pt's LOS was extended due to medical complications 2/2 infection at sternal incision and placement of wound vac, resulting in functional decline in mobility. Pt has made significant progress since then and has verbalized confidence for all tasks for discharge home. Pt plans to discharge to her daughter's home and is aware of recommendation for 24/7 supervision.  All goals met   Recommendation:  Patient will benefit from ongoing skilled PT services in home health setting to continue to advance safe functional mobility, address ongoing impairments in transfers, generalized strengthening, dynamic standing balance/coordination, gait training, NMR, endurance, and to minimize fall risk.  Equipment: No equipment provided; pt already has RW and WC  Reasons for discharge: treatment goals met and discharge from hospital  Patient/family agrees with progress made and goals achieved: Yes  PT Discharge Precautions/Restrictions Precautions Precautions: Fall;Sternal Restrictions Weight Bearing Restrictions: No Other Position/Activity Restrictions: sternal precautions Pain Interference Pain Interference Pain Effect on Sleep: 1. Rarely or not at all Pain Interference with Therapy Activities: 1. Rarely or not at all Pain Interference with Day-to-Day Activities: 1. Rarely or not at all Cognition Overall Cognitive Status: Within Functional Limits for tasks assessed Arousal/Alertness: Awake/alert Orientation Level: Oriented  X4 Memory: Appears intact Awareness: Appears intact Problem Solving: Appears intact Safety/Judgment: Appears intact Comments: requires occasional cues to maintain sternal precautions Sensation Sensation Light Touch: Impaired by gross assessment Proprioception: Appears Intact Additional Comments: decreased sensation LLE>RLE and reported feeling "tingly" Coordination Gross Motor Movements are Fluid and Coordinated: No Fine Motor Movements are Fluid and Coordinated: No Coordination and Movement Description: mild uncoordination due to L hemiparesis from previous CVA, difficulty maintaining sternal precautions, fatigue, generalized weakness/deconditioning, intermittent pain at sternal incision, and decreased balance/postural control. Finger Nose Finger Test: WFL on RUE, mild dysmetria and tremors on LUE Heel Shin Test: [facility] bilaterally Motor  Motor Motor: Hemiplegia;Abnormal postural alignment and control Motor - Skilled Clinical Observations: mild uncoordination due to L hemiparesis from previous CVA, difficulty maintaining sternal precautions, fatigue, generalized weakness/deconditioning, intermittent pain at sternal incision, and decreased balance/postural control.  Mobility Bed Mobility Bed Mobility: Rolling Right;Rolling Left;Sit to Supine;Supine to Sit Rolling Right: Supervision/verbal cueing Rolling Left: Supervision/Verbal cueing Supine to Sit: Supervision/Verbal cueing Sit to Supine: Supervision/Verbal cueing Transfers Transfers: Sit to Stand;Stand to Sit;Stand Pivot Transfers Sit to Stand: Supervision/Verbal cueing Stand to Sit: Supervision/Verbal cueing Stand Pivot Transfers: Supervision/Verbal cueing Stand Pivot Transfer Details: Verbal cues for technique;Verbal cues for precautions/safety;Verbal cues for safe use of DME/AE Stand Pivot Transfer Details (indicate cue type and reason): cues to fasten LUE to RW hand orthosis and occasional cues for sternal precautions Transfer  (Assistive device): Rolling walker Locomotion  Gait Ambulation: Yes Gait Assistance: Supervision/Verbal cueing Gait Distance (Feet): 150 Feet Assistive device: Rolling walker Gait Assistance Details: Verbal cues for precautions/safety;Verbal cues for safe use of DME/AE;Verbal cues for technique Gait Assistance Details: verbal cues to maintain LUE on RW orthosis and for improved foot clearance Gait Gait: Yes Gait Pattern: Impaired Gait Pattern: Decreased stride length;Decreased step length - right;Decreased step length - left;Poor foot clearance - left;Poor foot clearance - right;Step-through pattern Gait  velocity: decreased Stairs / Additional Locomotion Stairs: Yes Stairs Assistance: Contact Guard/Touching assist Stair Management Technique: Two rails Number of Stairs: 4 Height of Stairs: 6 Ramp: Supervision/Verbal cueing (RW) Pick up small object from the floor (from standing position) activity did not occur: Safety/medical concerns (decreased balance/postural control) Wheelchair Mobility Wheelchair Mobility: No  Trunk/Postural Assessment  Cervical Assessment Cervical Assessment: Exceptions to [facility] (forward head) Thoracic Assessment Thoracic Assessment: Exceptions to [facility] (rounded shoulders) Lumbar Assessment Lumbar Assessment: Exceptions to [facility] (posterior pelvic tilt) Postural Control Postural Control: Deficits on evaluation  Balance Balance Balance Assessed: Yes Static Sitting Balance Static Sitting - Balance Support: Feet supported;Bilateral upper extremity supported Static Sitting - Level of Assistance: 7: Independent Dynamic Sitting Balance Dynamic Sitting - Balance Support: Feet supported;No upper extremity supported Dynamic Sitting - Level of Assistance: 6: Modified independent (Device/Increase time) Static Standing Balance Static Standing - Balance Support: Bilateral upper extremity supported (RW) Static Standing - Level of Assistance: 5: Stand by assistance  (supervision) Dynamic Standing Balance Dynamic Standing - Balance Support: Bilateral upper extremity supported (RW) Dynamic Standing - Level of Assistance: 5: Stand by assistance (supervision) Dynamic Standing - Comments: with transfers and gait Extremity Assessment  RLE Assessment RLE Assessment: Exceptions to [facility] General Strength Comments: grossly generalized to 4+/5 LLE Assessment LLE Assessment: Exceptions to [facility] General Strength Comments: grossly generalized to 4/5  Alfonse Alpers PT, DPT  09/11/2021, 7:35 AM

## 2021-09-11 NOTE — Progress Notes (Signed)
PROGRESS NOTE   Subjective/Complaints: She is very happy that wound vac was removed this morning! She says she has been moving bowels regularly with colace  ROS: Patient denies fever, rash, sore throat, blurred vision, nausea, vomiting, diarrhea, cough, shortness of breath or joint or back pain, headache, or mood change, +neuropathic pain in left hand, +chest pain after vac change, +hiccups, +constipation  Objective:   No results found. No results for input(s): WBC, HGB, HCT, PLT in the last 72 hours.    No results for input(s): NA, K, CL, CO2, GLUCOSE, BUN, CREATININE, CALCIUM in the last 72 hours.      Intake/Output Summary (Last 24 hours) at 09/11/2021 1108 Last data filed at 09/11/2021 0755 Gross per 24 hour  Intake 1000 ml  Output --  Net 1000 ml   Physical Exam: Vital Signs Blood pressure (!) 142/94, pulse 89, temperature 98 F (36.7 C), temperature source Oral, resp. rate 14, height 5\' 7"  (1.702 m), weight 88.6 kg, SpO2 93 %. Gen: no distress, normal appearing HEENT: oral mucosa pink and moist, NCAT Cardio: Reg rate Chest: normal effort, normal rate of breathing Abd: soft, non-distended Ext: no edema Psych: pleasant, normal affect Skin: vac to chest with s/s drainage (scant) Musc: No edema in extremities.  No tenderness in extremities. Relatively large, slightly firm, nontender nodule on right inner thigh Neuro: Alert and oriented Motor: Residual left sided hemiparesis from prior stroke, wrist drop, no changes. 5/5 throughout but 0/5 left wrist extension, 3/5 left wrist hand grip.   Assessment/Plan: 1. Functional deficits which require 3+ hours per day of interdisciplinary therapy in a comprehensive inpatient rehab setting. Physiatrist is providing close team supervision and 24 hour management of active medical problems listed below. Physiatrist and rehab team continue to assess barriers to  discharge/monitor patient progress toward functional and medical goals  Care Tool:  Bathing    Body parts bathed by patient: Right arm, Left arm, Chest, Abdomen, Front perineal area, Face, Left lower leg, Right lower leg, Left upper leg, Right upper leg, Buttocks   Body parts bathed by helper: Buttocks     Bathing assist Assist Level: Supervision/Verbal cueing     Upper Body Dressing/Undressing Upper body dressing   What is the patient wearing?: Pull over shirt    Upper body assist Assist Level: Set up assist    Lower Body Dressing/Undressing Lower body dressing      What is the patient wearing?: Pants     Lower body assist Assist for lower body dressing: Supervision/Verbal cueing     Toileting Toileting    Toileting assist Assist for toileting: Supervision/Verbal cueing     Transfers Chair/bed transfer  Transfers assist     Chair/bed transfer assist level: Supervision/Verbal cueing Chair/bed transfer assistive device: Walker, Clinical biochemist   Ambulation assist      Assist level: Supervision/Verbal cueing Assistive device: Walker-rolling Max distance: 36ft   Walk 10 feet activity   Assist     Assist level: Supervision/Verbal cueing Assistive device: Walker-rolling   Walk 50 feet activity   Assist Walk 50 feet with 2 turns activity did not occur: Safety/medical concerns (fatigue, LE weakness, decreased balance/postural  control, deconditioning)  Assist level: Supervision/Verbal cueing Assistive device: Walker-rolling    Walk 150 feet activity   Assist Walk 150 feet activity did not occur: Safety/medical concerns (fatigue, LE weakness, decreased balance/postural control, deconditioning)         Walk 10 feet on uneven surface  activity   Assist Walk 10 feet on uneven surfaces activity did not occur: Safety/medical concerns (fatigue, LE weakness, decreased balance/postural control, deconditioning)          Wheelchair     Assist Is the patient using a wheelchair?: Yes Type of Wheelchair: Manual Wheelchair activity did not occur: Safety/medical concerns (fatigue, LE weakness, decreased balance/postural control, deconditioning)         Wheelchair 50 feet with 2 turns activity    Assist    Wheelchair 50 feet with 2 turns activity did not occur: Safety/medical concerns (fatigue, LE weakness, decreased balance/postural control, deconditioning)       Wheelchair 150 feet activity     Assist  Wheelchair 150 feet activity did not occur: Safety/medical concerns (fatigue, LE weakness, decreased balance/postural control, deconditioning)       Blood pressure (!) 142/94, pulse 89, temperature 98 F (36.7 C), temperature source Oral, resp. rate 14, height 5\' 7"  (1.702 m), weight 88.6 kg, SpO2 93 %.  Medical Problem List and Plan: 1.  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  Status post median sternotomy resection of fibroelastoma from aortic valve/CABG x4 08/04/2021.  Sternal precautions  Continue CIR therapies including PT, OT  2.  Impaired mobility, ambulation improved to 132 feet! -DVT/anticoagulation:  Pharmaceutical: continue Lovenox             -antiplatelet therapy: Continue Aspirin 325 mg daily 3. Post-stroke left hand pain: Continue Robaxin and tramadol as needed. Added B6 50mg  daily to improve sensitivity to pain. Decreased Gabapentin to 100mg  BID and 600mg  HS  Controlled on 9/27. Decrease Tramadol to 50 q12H PRN.   Discussed trialing CBD oil outpatient once she is off of opioids 4. Depression/Anxiety: Continue Zoloft 100 mg daily, decrease xanax to daily PRN             -antipsychotic agents: N/A 5. Neuropsych: This patient is capable of making decisions on her own behalf. 6. Chest incision: Healing well, continue Routine skin checks 7. Fluids/Electrolytes/Nutrition: Routine in and outs 8.ABLA. monitor weekly.   Hemoglobin stable 9.   Diabetes mellitus with peripheral neuropathy.  Hemoglobin A1c 7.8.  DiaBeta 5 mg daily, Glucophage 1000 mg twice daily PTA.   Glucophage 500 twice daily started on 9/18--observe for effect 9/19. Check Cr on Monday.  10  Hypertension.   Lasix 40 mg daily , hydralazine 10 mg nightly, Cozaar 100 mg daily, Lopressor 12.5 mg twice daily DC'd Continue Norvasc 10 Cozaar 50 restarted on 9/18--observe 9/19 11.  GERD.  Continue Protonix 12.  Restless leg syndrome.  Requip nightly--HELD dt lethargy 13.  Hyperlipidemia.  Lipitor 14.  Obesity.  BMI 30.83.  Dietary follow-up 15.  History of right thalamic infarction 5/22.  Received CIR.  Continue aspirin. 16. Left wrist drop: splint ordered 17. Vitamin D deficiency: start ergocalciferol 50,000U once per week  18. Suboptimal magnesium: started 250mg  magnesium gluconate HS. Conitnue magnesium oxide 400mg  BID  19. Dry mouth: stop Cymbalta. Improved 20. Dizziness/weakness: UA normal. CT ordered given significant decline and risk factors for stroke- negative.  21. Leukocytosis: WBC up again 9/13, appreciate CT surgery following,  -s/p vac placement for sternal infection.  -Hemoglobin stable -transitioned to Ancef  22. Hyponatremia- lasix stopped 9/9    Na+ 135 on 9/14 and 9/19, monitor weekly.   -off of IVF 23. AKI: Resolved.  5. Urinary retention: counseled regarding importance of movement, preventing constipation.  Resolved. D/c bethanacol 15. Left hand swelling: continue to elevate, asked RN to apply ice.  16. Postsurgical chest pain: decrease Tylenol #3 to q8H prn- improved. IV Fentanyl ordered for vac dressing changes.  17. Cracked lips: blistex PRN ordered. Asked nurse to provide her another tube.  18. Constipation: continue dulcolax, added colace 50mg  BID. Resolved. D/c Miralax 19. Right inner thigh subcutaneous nodules Patient states she noticed this a few days ago, nontender, not enlarging.  Suspect that these are small hematomas -discussed  warm compresses, observation 20. Panic attacks regarding her vac not being removed tomorrow: assured her that it likely will be, and if it isn't we will try to extend her until vac is removed 21. Disposition: d/c home Fri 9/30  LOS:  31 days A FACE TO FACE EVALUATION WAS PERFORMED  [name] P Nivek Powley 09/11/2021, 11:08 AM

## 2021-09-11 NOTE — Progress Notes (Signed)
Inpatient Rehabilitation Care Coordinator Discharge Note   Patient Details  Name: [name] MRN: [id number] Date of Birth: 1956/01/26   Discharge location: HOME TO [place] DAUGHTER'S BOYFRIEND ALSO THERE  Length of Stay: 45 DAYS   Discharge activity level: SUPERVISION LEVEL  Home/community participation: ACTIVE  Patient response VA:NVBTYO Literacy - How often do you need to have someone help you when you read instructions, pamphlets, or other written material from your doctor or pharmacy?: Never  Patient response MA:YOKHTX Isolation - How often do you feel lonely or isolated from those around you?: Never  Services provided included: MD, RD, PT, OT, RN, CM, Pharmacy, TR, SW  Financial Services:  Charity fundraiser Utilized: Electronics engineer  Choices offered to/list presented to: PT  Follow-up services arranged:  Home Health, Patient/Family request agency HH/DME [place]: [place] [facility]-PT, OT ,RN      HH/DME Requested Agency: [place]  Patient response to transportation need: Is the patient able to respond to transportation needs?: Yes In the past 12 months, has lack of transportation kept you from medical appointments or from getting medications?: No In the past 12 months, has lack of transportation kept you from meetings, work, or from getting things needed for daily living?: No    Comments (or additional information):PT DID WELL ONCE STERNAL INFECTION WAS TREATED. GOT BACK TO SUPERVISION LEVEL AND READY TO GO HOME. AT TIMES MAY BE BY HERSELF. HAS ALL DME FROM PREVIOUS ADMISSIONS  Patient/Family verbalized understanding of follow-up arrangements:  Yes  Individual responsible for coordination of the follow-up plan: [name] [phone]  Confirmed correct DME delivered: [name] 09/11/2021    [name], [name]

## 2021-09-11 NOTE — Final Consult Note (Signed)
[place] Nurse wound follow up Patient receiving care in [place] Reason for Consult:Wound vac dressing change Wound type: Surgical  Pressure Injury POA: NA Measurement: 10 cm x 2.8 cm x 0.1 cm with a tunneled wound in the inferior aspect measuring 2 cm in depth Wound bed: Pink granulation tissue Drainage (amount, consistency, odor) Serosanguinos in canister Periwound: intact Dressing procedure/placement/frequency: 1 pieces of black foam removed.. one piece of white foam removed. Discontinued the NPWT and changed to moistened saline gauze, covered with dry gauze and secured with ABD pad and Tegaderm to allow patient to take a shower with PT today. This dressing will need to be changed twice daily.    Cathlean Marseilles Tamala Julian, MSN, RN, [place], Lysle Pearl, [facility] Wound Treatment Associate Pager [phone]

## 2021-09-12 ENCOUNTER — Other Ambulatory Visit (HOSPITAL_COMMUNITY): Payer: Self-pay

## 2021-09-12 LAB — GLUCOSE, CAPILLARY: Glucose-Capillary: 134 mg/dL — ABNORMAL HIGH (ref 70–99)

## 2021-09-12 NOTE — Progress Notes (Signed)
Patient ID: [name], female   DOB: 09/29/1956, 65 y.o.   MRN: [id number] Discharged to home accompanied by son. Discharge instructions given to patient per [place] PAC. No questions noted. SW saw before discharge. Supplies given for BID dressing changes for up to two weeks. Taken down in wheelchair w belongings to car. Margarito Liner, RN

## 2021-09-12 NOTE — Progress Notes (Signed)
Educated pt on wound care and  BID dressing changes.

## 2021-09-12 NOTE — Progress Notes (Signed)
PROGRESS NOTE   Subjective/Complaints: [name] has no complaints this morning Ready to go home! Is asking whether she will get home [name] is trying to get for her.   ROS: Patient denies fever, rash, sore throat, blurred vision, nausea, vomiting, diarrhea, cough, shortness of breath or joint or back pain, headache, or mood change, +neuropathic pain in left hand, +chest pain after vac change, +hiccups, +constipation  Objective:   No results found. No results for input(s): WBC, HGB, HCT, PLT in the last 72 hours.    No results for input(s): NA, K, CL, CO2, GLUCOSE, BUN, CREATININE, CALCIUM in the last 72 hours.      Intake/Output Summary (Last 24 hours) at 09/12/2021 1041 Last data filed at 09/11/2021 1845 Gross per 24 hour  Intake 720 ml  Output --  Net 720 ml   Physical Exam: Vital Signs Blood pressure (!) 155/78, pulse 80, temperature 98 F (36.7 C), temperature source Oral, resp. rate 14, height 5\' 7"  (1.702 m), weight 84 kg, SpO2 92 %. Gen: no distress, normal appearing HEENT: oral mucosa pink and moist, NCAT Cardio: Reg rate Chest: normal effort, normal rate of breathing Abd: soft, non-distended Ext: no edema Psych: pleasant, normal affec Skin: vac to chest with s/s drainage (scant) Musc: No edema in extremities.  No tenderness in extremities. Relatively large, slightly firm, nontender nodule on right inner thigh Neuro: Alert and oriented Motor: Residual left sided hemiparesis from prior stroke, wrist drop, no changes. 5/5 throughout but 0/5 left wrist extension, 3/5 left wrist hand grip.   Assessment/Plan: 1. Functional deficits which require 3+ hours per day of interdisciplinary therapy in a comprehensive inpatient rehab setting. Physiatrist is providing close team supervision and 24 hour management of active medical problems listed below. Physiatrist and rehab team continue to assess  barriers to discharge/monitor patient progress toward functional and medical goals  Care Tool:  Bathing    Body parts bathed by patient: Right arm, Left arm, Chest, Abdomen, Front perineal area, Face, Left lower leg, Right lower leg, Left upper leg, Right upper leg, Buttocks   Body parts bathed by helper: Buttocks     Bathing assist Assist Level: Supervision/Verbal cueing     Upper Body Dressing/Undressing Upper body dressing   What is the patient wearing?: Pull over shirt    Upper body assist Assist Level: Set up assist    Lower Body Dressing/Undressing Lower body dressing      What is the patient wearing?: Pants     Lower body assist Assist for lower body dressing: Set up assist     Toileting Toileting    Toileting assist Assist for toileting: Supervision/Verbal cueing     Transfers Chair/bed transfer  Transfers assist     Chair/bed transfer assist level: Supervision/Verbal cueing Chair/bed transfer assistive device: Programmer, multimedia   Ambulation assist      Assist level: Supervision/Verbal cueing Assistive device: Walker-rolling Max distance: 135ft   Walk 10 feet activity   Assist     Assist level: Supervision/Verbal cueing Assistive device: Walker-rolling   Walk 50 feet activity   Assist Walk 50 feet with 2 turns activity did not occur:  Safety/medical concerns (fatigue, LE weakness, decreased balance/postural control, deconditioning)  Assist level: Supervision/Verbal cueing Assistive device: Walker-rolling    Walk 150 feet activity   Assist Walk 150 feet activity did not occur: Safety/medical concerns (fatigue, LE weakness, decreased balance/postural control, deconditioning)  Assist level: Supervision/Verbal cueing Assistive device: Walker-rolling    Walk 10 feet on uneven surface  activity   Assist Walk 10 feet on uneven surfaces activity did not occur: Safety/medical concerns (fatigue, LE weakness, decreased  balance/postural control, deconditioning)   Assist level: Supervision/Verbal cueing Assistive device: Walker-rolling   Wheelchair     Assist Is the patient using a wheelchair?: Yes Type of Wheelchair: Manual Wheelchair activity did not occur: Safety/medical concerns (fatigue, LE weakness, decreased balance/postural control, deconditioning)  Wheelchair assist level: Dependent - Patient 0%      Wheelchair 50 feet with 2 turns activity    Assist    Wheelchair 50 feet with 2 turns activity did not occur: Safety/medical concerns (fatigue, LE weakness, decreased balance/postural control, deconditioning)   Assist Level: Dependent - Patient 0%   Wheelchair 150 feet activity     Assist  Wheelchair 150 feet activity did not occur: Safety/medical concerns (fatigue, LE weakness, decreased balance/postural control, deconditioning)   Assist Level: Dependent - Patient 0%   Blood pressure (!) 155/78, pulse 80, temperature 98 F (36.7 C), temperature source Oral, resp. rate 14, height 5\' 7"  (1.702 m), weight 84 kg, SpO2 92 %.  Medical Problem List and Plan: 1.  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  Status post median sternotomy resection of fibroelastoma from aortic valve/CABG x4 08/04/2021.  Sternal precautions  D/c home today 2.  Impaired mobility, ambulation improved to 132 feet! -DVT/anticoagulation:  Pharmaceutical: continue Lovenox             -antiplatelet therapy: Continue Aspirin 325 mg daily 3. Post-stroke left hand pain: Continue Robaxin and tramadol as needed. Added B6 50mg  daily to improve sensitivity to pain. Decreased Gabapentin to 100mg  BID and 600mg  HS  Controlled on 9/27. Decrease Tramadol to 50 q12H PRN.   Discussed trialing CBD oil outpatient once she is off of opioids 4. Depression/Anxiety: Continue Zoloft 100 mg daily, decrease xanax to daily PRN             -antipsychotic agents: N/A 5. Neuropsych: This patient is capable  of making decisions on her own behalf. 6. Chest incision: Healing well, continue Routine skin checks 7. Fluids/Electrolytes/Nutrition: Routine in and outs 8.ABLA. monitor weekly.   Hemoglobin stable 9.  Diabetes mellitus with peripheral neuropathy.  Hemoglobin A1c 7.8.  DiaBeta 5 mg daily, Glucophage 1000 mg twice daily PTA.   Glucophage 500 twice daily started on 9/18--observe for effect 9/19. Monitor Cr outpatent 10  Hypertension.   Lasix 40 mg daily , hydralazine 10 mg nightly, Cozaar 100 mg daily, Lopressor 12.5 mg twice daily DC'd Continue Norvasc 10 Cozaar 50 restarted on 9/18--observe 9/19 11.  GERD.  Continue Protonix 12.  Restless leg syndrome.  Requip nightly--HELD dt lethargy 13.  Hyperlipidemia.  Lipitor 14.  Obesity.  BMI 30.83.  Dietary follow-up 15.  History of right thalamic infarction 5/22.  Received CIR.  Continue aspirin. 16. Left wrist drop: splint ordered 17. Vitamin D deficiency: start ergocalciferol 50,000U once per week  18. Suboptimal magnesium: started 250mg  magnesium gluconate HS. Conitnue magnesium oxide 400mg  BID  19. Dry mouth: stop Cymbalta. Improved 20. Dizziness/weakness: UA normal. CT ordered given significant decline and risk factors for stroke- negative.  21. Leukocytosis: WBC  up again 9/13, appreciate CT surgery following,  -s/p vac placement for sternal infection.  -Hemoglobin stable -transitioned to Ancef, may d/c  22. Hyponatremia- lasix stopped 9/9    Na+ 135 on 9/14 and 9/19, monitor weekly.   -off of IVF 23. AKI: Resolved.  7. Urinary retention: counseled regarding importance of movement, preventing constipation.  Resolved. D/c bethanacol 15. Left hand swelling: continue to elevate, asked RN to apply ice.  16. Postsurgical chest pain: decrease Tylenol #3 to q8H prn- improved. IV Fentanyl ordered for vac dressing changes.  17. Cracked lips: blistex PRN ordered. Asked nurse to provide her another tube.  18. Constipation: continue dulcolax,  added colace 50mg  BID. Resolved. D/c Miralax 19. Right inner thigh subcutaneous nodules Patient states she noticed this a few days ago, nontender, not enlarging.  Suspect that these are small hematomas -discussed warm compresses, observation 20. Panic attacks regarding her vac not being removed tomorrow: assured her that it likely will be, and if it isn't we will try to extend her until vac is removed 21. Disposition: d/c home Fri 9/30   >30 minutes spent in discharge of patient including review of medications and follow-up appointments, physical examination, and in answering all patient's questions   LOS:  32 days A FACE TO [place] 09/12/2021, 10:41 AM

## 2021-09-12 NOTE — Progress Notes (Signed)
      [address]       [place] [address]             [phone]      In good spirits  Wound is dressed  For DC today  Will f/u in office for wound management  Remo Lipps C. Roxan Hockey, MD [facility] [phone]

## 2021-09-15 ENCOUNTER — Other Ambulatory Visit: Payer: Self-pay | Admitting: *Deleted

## 2021-09-15 NOTE — Patient Outreach (Addendum)
[facility]) Care Management  09/15/2021  [name] 04/27/1956 [id number]   Transition of care call/case closure   Referral received:09/12/21 ( dischage from CIR)  Initial outreach:09/15/21 Insurance: [facility]    Initial unsuccessful telephone call to patient's preferred contact her daughter [name] in order to complete transition of care assessment;  per [place] she is at work now cannot talk states when she gets off work she will return call when  with patient verified that she has may return call back number   Objective: Per electronic medical record [name]  was hospitalized at [facility] 8/22-8/29/22 for  Resection of Aortic valve mass , She was discharge to Inpatient rehab 8/29-9/30/22  Debility, superficial sternal wound infection, incision debridement of sternal wound and wound Vac placement , VAC discontinued 09/11/21 with wet to dry dressing changes. Comorbidities include: Diabetes, hypertension, hyperlipidemia, obesity, Right thalamic infarction inpatient rehab, 5/17-6/14/22,  left side hemiparesis , papillary fibroelastoma of aortic valve, anxiety,  She was discharged to home on 09/12/21  with [facility] home health RN/PT/OT has equipment from previous admission. .  Plan: This RNCM will route unsuccessful outreach letter with [place] Management pamphlet and 24 hour Nurse Advice Line Magnet to [place] Management clinical pool to be mailed to patient's home address. This RNCM will attempt another outreach within 4 business days to patient .    Joylene Draft, RN, BSN  [place] Management Coordinator  [phone]- Mobile [phone]- Toll Free Main Office

## 2021-09-18 ENCOUNTER — Other Ambulatory Visit: Payer: Self-pay | Admitting: *Deleted

## 2021-09-18 ENCOUNTER — Other Ambulatory Visit: Payer: Self-pay

## 2021-09-18 ENCOUNTER — Encounter: Payer: Self-pay | Admitting: *Deleted

## 2021-09-18 MED ORDER — OMRON 3 SERIES BP MONITOR DEVI
0 refills | Status: DC
  Filled 2021-09-18: qty 1, 1d supply, fill #0

## 2021-09-18 NOTE — Patient Outreach (Addendum)
[facility]) Care Management  09/18/2021  [name] 1956/05/08 [id number]   Transition of care call   Referral received:0/30/22  Initial outreach:09/15/21 Insurance: [place] [facility]    Subjective: 2nd attempt  successful telephone call to patient's preferred number in order to complete transition of care assessment; 2 HIPAA identifiers verified. Explained purpose of call and completed transition of care assessment.  [name] states that she is doing okay. She dicussed having some difficulty with getting off of couch when sitting. She has made contact regarding getting a lift chair. She reports surgical incision area without increase in redness or drainage no fever or signs of infection, she reports her daughter assisting with dressing changes.  states surgical pain well managed with prescribed medications She reports  tolerating diet,but not much appetite,reinforced importance of protein in diet to aid in healing and strength, she reports her family has been able to assist with meals and encouragement . Discussed protein supplement such as glucerna. She  denies bowel or bladder problems. Patient is staying with her daughter , assistance from her son and daughters boyfriend. [name] reports tolerating mobility in the home with use of walker. She discussed possibly purchasing a 2nd BSC to have next to her bed current bsc is in bathroom over commode . are assisting with his/her recovery.  Patient reports not receiving phone call regarding home health services with [facility], agreeable to CM assisting with follow up.   Reviewed accessing the following [place] Benefits : She discussed health issues of diabetes and is enrolled in Active health management health coach.She says she  does the hospital indemnity She  uses a [facility] at [facility] , discussed with patient , benefit of blood pressure monitor at pharmacy at low cost. Placed call to [place]  explained cost of $10 and patient states will notify them when someone able to pick up.   Objective:   [name]  was hospitalized at [facility] 8/22-8/29/22 for  Resection of Aortic valve mass , She was discharge to Inpatient rehab 8/29-9/30/22  Debility, superficial sternal wound infection, incision debridement of sternal wound and wound Vac placement , VAC discontinued 09/11/21 with wet to dry dressing changes. Comorbidities include: Diabetes, hypertension, hyperlipidemia, obesity, Right thalamic infarction inpatient rehab, 5/17-6/14/22,  left side hemiparesis , papillary fibroelastoma of aortic valve, anxiety,  She was discharged to home on 09/12/21  with [facility] home health RN/PT/OT has equipment from previous admission. . Assessment:  Patient voices good understanding of all discharge instructions.  See transition of care flowsheet for assessment details.   Plan:  Reviewed hospital discharge diagnosis of Resection of Aortic valve mass  and discharge treatment plan using hospital discharge instructions, assessing medication adherence, reviewing problems requiring provider notification, and discussing the importance of follow up with surgeon, primary care provider and/or specialists as directed. Reinforced attending post discharge follow up recommendations, offered assistance with scheduling appointments with PCP, cardiology and surgeon , she declined stating she could do it she has mychart for contact numbers.   Reviewed [place] healthy lifestyle program information to receive discounted premium for  2023   Step 1: Get  your annual physical  Step 2: Complete your health assessment  Step 3:Identify your current health status and complete the corresponding action step between December 14, 2020 and August 14, 2021.    Using [place] website, verified that patient is an active participate in [facility]'s Active Health Management chronic disease  management program.    Plan  Patient agreeable to  call to  follow up /assist with care coordination needs will plan return call within the next  4 days.  Will route successful outreach letter with [place] Management pamphlet and 24 Hour Nurse Line Magnet to [place] Management clinical pool to be mailed to patient's home address.  Thanked patient for their services to [facility]. Addendum Outreach call to Dr. Suzzette [name] Rehab MD office to discuss call to [facility] home health and being informed that they did not receive referral orders. Able to leave a message at office with fax number to [place] to send [facility] referral orders.   Kimberly Draft, RN, BSN  [place] Management Coordinator  [phone]- Mobile [phone]- Toll Free Main Office

## 2021-09-19 ENCOUNTER — Other Ambulatory Visit: Payer: Self-pay

## 2021-09-22 ENCOUNTER — Other Ambulatory Visit: Payer: Self-pay | Admitting: *Deleted

## 2021-09-22 ENCOUNTER — Telehealth: Payer: Self-pay

## 2021-09-22 NOTE — Patient Outreach (Signed)
[facility]) Care Management  09/22/2021  OMEKA HOLBEN 08-19-1956 [id number]  Care Coordination    Outreach call to [facility] home health to follow up whether they have received home health orders from discharge from inpatient rehab. Spoke representative that verifies that they have not received new referral orders.   Placed call to Dr. Ranell Patrick office able to leave a message regarding patient need for home health referral order being sent to [facility] home health, able to leave a message with my contact number for follow up if needed.   Plan Will await collaboration follow up,if no return call will outreach office again today and patient planned return call on 10/11.   Joylene Draft, RN, BSN  [place] Management Coordinator  [phone]- Mobile [phone]- Toll Free Main Office

## 2021-09-22 NOTE — Telephone Encounter (Signed)
Per [name] with [facility]: [name] has /not received her out patient Home Health services. Patient old [name] orders were closed out on 08/12/2021.  A new referral will have to be place so [facility]  can start. Per discharge summary : Services provided included: MD, RD, PT, OT, RN, CM, Pharmacy, TR, SW. Please send a new referral.  Thank you.

## 2021-09-23 ENCOUNTER — Telehealth: Payer: Self-pay

## 2021-09-23 ENCOUNTER — Other Ambulatory Visit: Payer: Self-pay | Admitting: *Deleted

## 2021-09-23 NOTE — Telephone Encounter (Signed)
Patient contacted the office and left voicemail message stating that she has a small amount of green discharge coming from the sternal site that she is currently doing dressing changes on. She is s/p CABG with Dr. Roxan Hockey 08/28/21 with subsequent sternal wound debridements after. She stated that she was to have home health set up for her at home but she has not heard anything. [place] to re-establish care.  She is changing her dressings at home and states that when she does she has a "green" discharge from the site. States that it does not hurt, no redness, no odor, and afebrile. Advised to come in tomorrow for follow-up and evaluation of sternal wound with a PA. She stated that she would have to call back as she does not know if she has a ride.

## 2021-09-23 NOTE — Patient Outreach (Addendum)
[facility]) Care Management  09/23/2021  [name] 03/01/1956 [id number]   Transition of care follow up call  Referral received:0/30/22  Initial outreach:09/15/21 Insurance: [facility]    Subjective Successful follow up call to patient, she reports that she is getting along good.. She reports not hearing from home health agency yet. Explained to patient outreach call to Rehab MD office and they are following up on new referral orders for services.  Patient states she has been able to get a lift chair and that is very helpful.  Discussed with patient how her chest incision area was looking she reports that it looks alright, no redness, no fever they continue to change dressing twice daily, she reports noting light green colored drainage on dressing when removed . Discussed with patient importance of clean technique when changing dressing , good handwashing. Discussed importance of notifying surgeon office of concerns, signs of infection with provider. She states that she is not at home and will contact him when she returns home for an appointment and to notify of sternal wound concerns.Offered to assist patient with making contact with provider at this time but she declines stating she will do it.  Patient states that her son is working to provide transportation to her scheduled appointment on 10/14 with Dr.Raulkar. Discussed with patient to notify if Care Manager if transportation to appointment not available to access transportation resources  as it is important to attend appointments.  Plan Will follow up on home health services referral order and patient follow up appointments with surgeon,PCP and cardiology. Will plan return call to patient in the next 2 days as needed for care coordination needs.   Secure chat team message to Ovidio Kin, LCSW rehab regarding assistance with home health order services at discharge.  Returned call to patient assisted with  making contact with Dr. Roxan Hockey office regarding follow up appointment, able to leave a message on nurse line regarding concern related to patient complaint of noting light green drainage on dressing when changing dressing.    Joylene Draft, RN, BSN  [place] Management Coordinator  [phone]- Mobile [phone]- Toll Free Main Office

## 2021-09-25 ENCOUNTER — Other Ambulatory Visit: Payer: Self-pay | Admitting: *Deleted

## 2021-09-25 DIAGNOSIS — E1165 Type 2 diabetes mellitus with hyperglycemia: Secondary | ICD-10-CM | POA: Diagnosis not present

## 2021-09-25 DIAGNOSIS — I639 Cerebral infarction, unspecified: Secondary | ICD-10-CM | POA: Diagnosis not present

## 2021-09-25 DIAGNOSIS — I69354 Hemiplegia and hemiparesis following cerebral infarction affecting left non-dominant side: Secondary | ICD-10-CM | POA: Diagnosis not present

## 2021-09-25 NOTE — Patient Outreach (Signed)
[facility]) Care Management  09/25/2021  PINKI ROTTMAN 05-Jul-1956 [id number]  Care Coordination   Placed call to patient regarding whether she had transportation to appointment with Dr. Octavio Manns on tomorrow. Patient states that her daughter will be able to provide transportation to appointment.  Discussed  with follow up with surgeon patient discussed again being unsure of transportation, informed her assistance with [place] transportation may be available. Stressed importance of wound monitoring to identify additional care needs.  She agrees to scheduling appointment and notifying me if transportation concerns. She reports that her son is checking to see if he can provide transportation to appointment on 10/18 with Neurology.  Patient reports having a little accident report having a fall down the steps of her home, she states she didn't get hurt only a scrape on her leg. Reviewed safety precautions, encouraged to notify Dr. Ranell Patrick  of fall  at visit on tomorrow.   Patient return call to me states she has spoken with Caryl Pina at Dr. Roxan Hockey office regarding being able to arrange follow up appointment on 9/18 same day she has another appointment with in [place]. She states that her son is checking to be off on that day, encouraged patient if concern with transportation to appointments.    Monthly telephonic UMR case management review with UMR RNCMs and Dr Alonza Bogus. Update provided to include:  Medical Conditions Update: Daylen Hack  was hospitalized at [facility] 8/22-8/29/22 for  Resection of Aortic valve mass , She was discharge to Inpatient rehab 8/29-9/30/22  Debility, superficial sternal wound infection, incision debridement of sternal wound and wound Vac placement , VAC discontinued 09/11/21 with wet to dry dressing changes. Comorbidities include: Diabetes, hypertension, hyperlipidemia, obesity, Right thalamic infarction inpatient rehab,  5/17-6/14/22,  left side hemiparesis , papillary fibroelastoma of aortic valve, anxiety,  She was discharged to home on 09/12/21  with [facility] home health RN/PT/OT has equipment from previous admission. .    Follow up: Discussed dischage follow up plan  to include home health, Cardiology, PCP, Rehab, neuro , adressing barriers of transporation, home health referral orders being reestablished .      Plan Will plan follow up call to patient in the next 3 business days for care coordination.  Joylene Draft, RN, BSN  [place] Management Coordinator  [phone]- Mobile [phone]- Toll Free Main Office

## 2021-09-26 ENCOUNTER — Other Ambulatory Visit: Payer: Self-pay | Admitting: *Deleted

## 2021-09-26 ENCOUNTER — Encounter: Payer: 59 | Attending: Physical Medicine and Rehabilitation | Admitting: Physical Medicine and Rehabilitation

## 2021-09-26 ENCOUNTER — Other Ambulatory Visit: Payer: Self-pay

## 2021-09-26 VITALS — BP 133/79 | HR 92 | Temp 98.2°F | Ht 67.0 in | Wt 172.4 lb

## 2021-09-26 DIAGNOSIS — Z951 Presence of aortocoronary bypass graft: Secondary | ICD-10-CM | POA: Insufficient documentation

## 2021-09-26 DIAGNOSIS — G59 Mononeuropathy in diseases classified elsewhere: Secondary | ICD-10-CM | POA: Insufficient documentation

## 2021-09-26 DIAGNOSIS — E1142 Type 2 diabetes mellitus with diabetic polyneuropathy: Secondary | ICD-10-CM | POA: Insufficient documentation

## 2021-09-26 DIAGNOSIS — I10 Essential (primary) hypertension: Secondary | ICD-10-CM | POA: Diagnosis not present

## 2021-09-26 MED ORDER — ROPINIROLE HCL 0.25 MG PO TABS
0.5000 mg | ORAL_TABLET | Freq: Every evening | ORAL | 3 refills | Status: DC | PRN
Start: 2021-09-26 — End: 2021-12-04
  Filled 2021-09-26 – 2021-10-30 (×2): qty 60, 30d supply, fill #0

## 2021-09-26 MED ORDER — MAGNESIUM GLYCINATE 100 MG PO CAPS
1.0000 | ORAL_CAPSULE | Freq: Every day | ORAL | 3 refills | Status: DC
  Filled 2021-09-26: qty 30, fill #0

## 2021-09-26 MED ORDER — ASPIRIN 325 MG PO TBEC: 325.0000 mg | DELAYED_RELEASE_TABLET | Freq: Every day | ORAL | 0 refills | Status: DC

## 2021-09-26 NOTE — Patient Instructions (Addendum)
[facility] [place] location besides [employer]  Recommend topical CBD oil- discussed its benefits in reducing inflammation, pain, insomnia, and anxiety.  -Discussed that CBD oil differs from marijuana in that it does not contain THC- the substance that causes euphoria.  -Discussed that it is made from the hemp plant.  -It has been used for thousands of years -preliminary research suggests that if may be able to shrink cancerous tumors, stop plaque formation in Alzheimer's Disease, and slow the progress of brain disease from concussions.  -Additional benefits that have been demonstrated in studies include improved nausea, indigestion, and brain health, and reduced seizures.  -In a survey 92% of patients who tried medical cannabis felt it improved symptoms such as chronic pain, arthritis, migraines, and cancer.    -Discussed following foods that may reduce pain: 1) Ginger (especially studied for arthritis)- reduce leukotriene production to decrease inflammation 2) Blueberries- high in phytonutrients that decrease inflammation 3) Salmon- marine omega-3s reduce joint swelling and pain 4) Pumpkin seeds- reduce inflammation 5) dark chocolate- reduces inflammation 6) turmeric- reduces inflammation 7) tart cherries - reduce pain and stiffness 8) extra virgin olive oil - its compound olecanthal helps to block prostaglandins  9) chili peppers- can be eaten or applied topically via capsaicin 10) mint- helpful for headache, muscle aches, joint pain, and itching 11) garlic- reduces inflammation  Link to further information on diet for chronic pain: [email or web address]   HTN: -BP is 133/79 today.  -Advised checking BP daily at home and logging results to bring into follow-up appointment with her PCP and myself. -Reviewed BP meds today.  -Advised regarding healthy foods that can help lower blood pressure  and provided with a list: 1) citrus foods- high in vitamins and minerals 2) salmon and other fatty fish - reduces inflammation and oxylipins 3) swiss chard (leafy green)- high level of nitrates 4) pumpkin seeds- one of the best natural sources of magnesium 5) Beans and lentils- high in fiber, magnesium, and potassium 6) Berries- high in flavonoids 7) Amaranth (whole grain, can be cooked similarly to rice and oats)- high in magnesium and fiber 8) Pistachios- even more effective at reducing BP than other nuts 9) Carrots- high in phenolic compounds that relax blood vessels and reduce inflammation 10) Celery- contain phthalides that relax tissues of arterial walls 11) Tomatoes- can also improve cholesterol and reduce risk of heart disease 12) Broccoli- good source of magnesium, calcium, and potassium 13) Greek yogurt: high in potassium and calcium 14) Herbs and spices: Celery seed, cilantro, saffron, lemongrass, black cumin, ginseng, cinnamon, cardamom, sweet basil, and ginger 15) Chia and flax seeds- also help to lower cholesterol and blood sugar 16) Beets- high levels of nitrates that relax blood vessels  17) spinach and bananas- high in potassium  -Provided lise of supplements that can help with hypertension:  1) magnesium: one high quality brand is Bioptemizers since it contains all 7 types of magnesium, otherwise over the counter magnesium gluconate 400mg  is a good option 2) B vitamins 3) vitamin D 4) potassium 5) CoQ10 6) L-arginine 7) Vitamin C 8) Beetroot -Educated that goal BP is 120/80. -Made goal to incorporate some of the above foods into diet.

## 2021-09-26 NOTE — Patient Outreach (Signed)
Kimberly [facility]) Care Management  09/26/2021  [name] 1956/04/30 [id number]   Received voicemail message from patient, returned call to patient she states she was calling to inquire regarding directions to [place] office. She reports that he daughter was able locate office for visit .  Patient report Dr.Raulkar is going to follow up on referral for home health services. She states provider took picture of chest wound to send to surgeon.      Plan Patient agreeable to follow up in the next week.    Joylene Draft, RN, BSN  [place] Management Coordinator  [phone]- Mobile [phone]- Toll Free Main Office

## 2021-09-26 NOTE — Progress Notes (Signed)
Subjective:    Patient ID: [name], female    DOB: 03-Aug-1956, 65 y.o.   MRN: [id number]  HPI [name] is a 65 year old woman who presents for hospital up s/p CABG.  She has been doing well at home but has not yet received and PT, OT, or nursing services. She says that SW is aware and working on this.   She has been doing her dressing changes and inspecting her wound daily  She does not need any refills of her medications.  BP is currently well controlled  Has not been walking as much as when she was in the hospital.   Pain Inventory Average Pain 0 Pain Right Now 0 My pain is  no pain  In the last 24 hours, has pain interfered with the following? General activity 0 Relation with others 0 Enjoyment of life 0 What TIME of day is your pain at its worst? no pain Sleep (in general) Good  Pain is worse with: no pain Pain improves with: no pain Relief from Meds: no pain  walk with assistance use a walker  disabled: date disabled short term on leave from [place];  weakness trouble walking anxiety  Any changes since last visit?  no  Any changes since last visit?  no    Family History  Problem Relation Age of Onset   Bone cancer Brother    Aneurysm Mother        brain   Diabetes Father    CAD Father    Heart failure Father    Colon cancer Father    Cancer Father    Alzheimer's disease Paternal Grandmother    Dementia Paternal Grandmother    Mental illness Paternal Grandfather    Healthy Daughter    Healthy Son    Breast cancer Neg Hx    Social History   Socioeconomic History   Marital status: Widowed    Spouse name: Not on file   Number of children: 2   Years of education: Not on file   Highest education level: 12th grade  Occupational History    Employer: [employer]  Tobacco Use   Smoking status: Former    Packs/day: 1.50    Years: 25.00    Pack years: 37.50    Types: Cigarettes    Quit date: 03/24/2007    Years since  quitting: 14.5   Smokeless tobacco: Never  Vaping Use   Vaping Use: Never used  Substance and Sexual Activity   Alcohol use: Not Currently   Drug use: No   Sexual activity: Not Currently    Birth control/protection: Post-menopausal  Other Topics Concern   Not on file  Social History Narrative   Not on file   Social Determinants of Health   Financial Resource Strain: Not on file  Food Insecurity: Not on file  Transportation Needs: Not on file  Physical Activity: Not on file  Stress: Not on file  Social Connections: Not on file   Past Surgical History:  Procedure Laterality Date   AORTIC VALVE REPLACEMENT N/A 08/04/2021   Procedure: RESECTION AORTIC VALVE TUMOR;  Surgeon: [name], MD;  Location: [place];  Service: Open Heart Surgery;  Laterality: N/A;   APPLICATION OF WOUND VAC N/A 08/24/2021   Procedure: APPLICATION OF WOUND VAC;  Surgeon: Gaye Pollack, MD;  Location: [place];  Service: Thoracic;  Laterality: N/A;   APPLICATION OF WOUND VAC N/A 08/28/2021   Procedure: WOUND VAC CHANGE;  Surgeon: [name], MD;  Location: [place];  Service: Thoracic;  Laterality: N/A;   BREAST EXCISIONAL BIOPSY Right 1999   neg   BREAST LUMPECTOMY Right 2005   as reported by patient   BUBBLE STUDY  04/24/2021   Procedure: BUBBLE STUDY;  Surgeon: Sueanne Margarita, MD;  Location: [place];  Service: Cardiovascular;;   CATARACT EXTRACTION  12/2011   CATARACT EXTRACTION W/PHACO Left 12/02/2016   Procedure: CATARACT EXTRACTION PHACO AND INTRAOCULAR LENS PLACEMENT ([place]);  Surgeon: Estill Cotta, MD;  Location: [facility];  Service: Ophthalmology;  Laterality: Left;  [place] 2:14 AP% 28.4 CDE 59.73 Fluid pack lot # [id number]   CORONARY ARTERY BYPASS GRAFT N/A 08/04/2021   Procedure: CORONARY ARTERY BYPASS GRAFTING (CABG) X  4  USING LEFT INTERNAL MAMMARY ARTERY AND RIGHT GREATER SAPHENOUS VEIN ENDOSCOPIC CONDUITS;  Surgeon: [name], MD;  Location: [place];  Service:  Open Heart Surgery;  Laterality: N/A;   DIAGNOSTIC LAPAROSCOPY     EYE SURGERY     IR CT HEAD LTD  04/19/2021   IR PERCUTANEOUS ART THROMBECTOMY/INFUSION INTRACRANIAL INC DIAG ANGIO  04/19/2021       IR PERCUTANEOUS ART THROMBECTOMY/INFUSION INTRACRANIAL INC DIAG ANGIO  04/19/2021   IR US GUIDE VASC ACCESS LEFT  04/19/2021   RADIOLOGY WITH ANESTHESIA N/A 04/19/2021   Procedure: IR WITH ANESTHESIA;  Surgeon: Luanne Bras, MD;  Location: [place];  Service: Radiology;  Laterality: N/A;   STERNAL WOUND DEBRIDEMENT N/A 08/24/2021   Procedure: STERNAL WOUND DRAINAGE AND DEBRIDEMENT;  Surgeon: Gaye Pollack, MD;  Location: [place] [facility];  Service: Thoracic;  Laterality: N/A;   STERNAL WOUND DEBRIDEMENT N/A 08/28/2021   Procedure: STERNAL WOUND DEBRIDEMENT;  Surgeon: [name], MD;  Location: [place];  Service: Thoracic;  Laterality: N/A;   TEE WITHOUT CARDIOVERSION N/A 04/24/2021   Procedure: TRANSESOPHAGEAL ECHOCARDIOGRAM (TEE);  Surgeon: Sueanne Margarita, MD;  [facility];  Service: Cardiovascular;  Laterality: N/A;   TEE WITHOUT CARDIOVERSION N/A 08/04/2021   Procedure: TRANSESOPHAGEAL ECHOCARDIOGRAM (TEE);  Surgeon: [name], MD;  Location: [place];  Service: Open Heart Surgery;  Laterality: N/A;   TOTAL HIP ARTHROPLASTY Right 04/04/2018   Procedure: TOTAL HIP ARTHROPLASTY;  Surgeon: Dereck Leep, MD;  Location: [facility];  Service: Orthopedics;  Laterality: Right;   Past Medical History:  Diagnosis Date   Anxiety    Arthritis    Cataract    left   Depression    Diabetes mellitus without complication (HCC)    type 2   Environmental and seasonal allergies    GERD (gastroesophageal reflux disease)    Hyperlipidemia    Hypertension    Joint pain    as reported by patient   Right thalamic infarction ([place]) 04/29/2021   Stroke ([place]) 04/2021   admitted at [facility] - left sided weakness   Urinary incontinence    as stated by patient   BP 133/79   Pulse 92    Temp 98.2 F (36.8 C)   Ht 5\' 7"  (1.702 m)   Wt 172 lb 6.4 oz (78.2 kg)   SpO2 98%   BMI 27.00 kg/m   Opioid Risk Score:   Fall Risk Score:  `1  Depression screen PHQ 2/9  Depression screen [facility] 2/9 09/26/2021 03/20/2021 09/20/2020 08/24/2019 03/24/2018 07/15/2017 10/26/2016  Decreased Interest 1 1 0 1 1 1 3   Down, Depressed, Hopeless 1 1 1  0 1 1 3   PHQ - 2 Score 2  2 1 1 2 2 6   Altered sleeping 0 0 1 1 0 0 1  Tired, decreased energy 2 1 1 1 1 1  0  Change in appetite 3 1 0 0 0 1 0  Feeling bad or failure about yourself  1 0 0 0 1 0 0  Trouble concentrating 0 0 0 0 0 0 0  Moving slowly or fidgety/restless 0 1 0 0 0 0 0  Suicidal thoughts 0 0 1 0 0 0 0  PHQ-9 Score 8 5 4 3 4 4 7   Difficult doing work/chores - Not difficult at all Not difficult at all Not difficult at all Somewhat difficult Not difficult at all Not difficult at all  Some recent data might be hidden     Review of Systems  Constitutional:  Positive for unexpected weight change.       Wt loss  HENT: Negative.    Eyes: Negative.   Respiratory: Negative.    Cardiovascular: Negative.   Gastrointestinal: Negative.   Endocrine: Negative.   Genitourinary: Negative.   Musculoskeletal:  Positive for gait problem.  Skin: Negative.   Allergic/Immunologic: Negative.   Neurological:  Positive for weakness.  Hematological: Negative.   Psychiatric/Behavioral:  Positive for dysphoric mood. The patient is nervous/anxious.   All other systems reviewed and are negative.     Objective:   Physical Exam Gen: no distress, normal appearing HEENT: oral mucosa pink and moist, NCAT Cardio: Reg rate Chest: normal effort, normal rate of breathing Abd: soft, non-distended Ext: no edema Psych: pleasant, normal affect Skin:    Neuro: Alert and oriented Musculoskeletal: In wheelchair       Assessment & Plan:   1) s/p CBAG -provided with handicap placard -messaged SW regarding getting her outpatient therapies -would  examined- recommended continued daily wound inspection  2) Left sided neuropathic pain -Discussed benefits of exercise in reducing pain. Recommend topical CBD oil- discussed its benefits in reducing inflammation, pain, insomnia, and anxiety.  -Discussed that CBD oil differs from marijuana in that it does not contain THC- the substance that causes euphoria.  -Discussed that it is made from the hemp plant.  -It has been used for thousands of years -preliminary research suggests that if may be able to shrink cancerous tumors, stop plaque formation in Alzheimer's Disease, and slow the progress of brain disease from concussions.  -Additional benefits that have been demonstrated in studies include improved nausea, indigestion, and brain health, and reduced seizures.  -In a survey 92% of patients who tried medical cannabis felt it improved symptoms such as chronic pain, arthritis, migraines, and cancer.    -Discussed following foods that may reduce pain: 1) Ginger (especially studied for arthritis)- reduce leukotriene production to decrease inflammation 2) Blueberries- high in phytonutrients that decrease inflammation 3) Salmon- marine omega-3s reduce joint swelling and pain 4) Pumpkin seeds- reduce inflammation 5) dark chocolate- reduces inflammation 6) turmeric- reduces inflammation 7) tart cherries - reduce pain and stiffness 8) extra virgin olive oil - its compound olecanthal helps to block prostaglandins  9) chili peppers- can be eaten or applied topically via capsaicin 10) mint- helpful for headache, muscle aches, joint pain, and itching 11) garlic- reduces inflammation  Link to further information on diet for chronic pain: [email or web address]   3) HTN: -BP is 133/79 today.  -Advised checking BP daily at home and logging results to bring into follow-up appointment with her PCP and myself. -Reviewed BP meds today.   -Advised regarding healthy foods that can help  lower blood pressure and provided with a list: 1) citrus foods- high in vitamins and minerals 2) salmon and other fatty fish - reduces inflammation and oxylipins 3) swiss chard (leafy green)- high level of nitrates 4) pumpkin seeds- one of the best natural sources of magnesium 5) Beans and lentils- high in fiber, magnesium, and potassium 6) Berries- high in flavonoids 7) Amaranth (whole grain, can be cooked similarly to rice and oats)- high in magnesium and fiber 8) Pistachios- even more effective at reducing BP than other nuts 9) Carrots- high in phenolic compounds that relax blood vessels and reduce inflammation 10) Celery- contain phthalides that relax tissues of arterial walls 11) Tomatoes- can also improve cholesterol and reduce risk of heart disease 12) Broccoli- good source of magnesium, calcium, and potassium 13) Greek yogurt: high in potassium and calcium 14) Herbs and spices: Celery seed, cilantro, saffron, lemongrass, black cumin, ginseng, cinnamon, cardamom, sweet basil, and ginger 15) Chia and flax seeds- also help to lower cholesterol and blood sugar 16) Beets- high levels of nitrates that relax blood vessels  17) spinach and bananas- high in potassium  -Provided lise of supplements that can help with hypertension:  1) magnesium: one high quality brand is Bioptemizers since it contains all 7 types of magnesium, otherwise over the counter magnesium gluconate 400mg  is a good option 2) B vitamins 3) vitamin D 4) potassium 5) CoQ10 6) L-arginine 7) Vitamin C 8) Beetroot -Educated that goal BP is 120/80. -Made goal to incorporate some of the above foods into diet.     4) Diabetes: -check CBGs daily, log, and bring log to follow-up appointment -avoid sugar, bread, pasta, rice -avoid snacking -perform daily foot exam and at least annual eye exam -try to incorporate into your diet some of the following foods which are good  for diabetes: 1) cinnamon- imitates effects of insulin, increasing glucose transport into cells ([place] or [place] cinnamon is best, least processed) 2) nuts- can slow down the blood sugar response of carbohydrate rich foods 3) oatmeal- contains and anti-inflammatory compound avenanthramide 4) whole-milk yogurt (best types are no sugar, [place] yogurt, or goat/sheep yogurt) 5) beans- high in protein, fiber, and vitamins, low glycemic index 6) broccoli- great source of vitamin A and C 7) quinoa- higher in protein and fiber than other grains 8) spinach- high in vitamin A, fiber, and protein 9) olive oil- reduces glucose levels, LDL, and triglycerides 10) salmon- excellent amount of omega-3-fatty acids 11) walnuts- rich in antioxidants 12) apples- high in fiber and quercetin 13) carrots- highly nutritious with low impact on blood sugar 14) eggs- improve HDL (good cholesterol), high in protein, keep you satiated 15) turmeric: improves blood sugars, cardiovascular disease, and protects kidney health 16) garlic: improves blood sugar, blood pressure, pain 17) tomatoes: highly nutritious with low impact on blood sugar

## 2021-09-30 ENCOUNTER — Ambulatory Visit (INDEPENDENT_AMBULATORY_CARE_PROVIDER_SITE_OTHER): Payer: 59 | Admitting: Adult Health

## 2021-09-30 ENCOUNTER — Other Ambulatory Visit: Payer: Self-pay

## 2021-09-30 ENCOUNTER — Ambulatory Visit
Admission: RE | Admit: 2021-09-30 | Discharge: 2021-09-30 | Disposition: A | Discharge status: Home / Self Care | Payer: 59 | Source: Ambulatory Visit | Attending: Thoracic Surgery (Cardiothoracic Vascular Surgery) | Admitting: Thoracic Surgery (Cardiothoracic Vascular Surgery)

## 2021-09-30 ENCOUNTER — Encounter: Payer: Self-pay | Admitting: Adult Health

## 2021-09-30 ENCOUNTER — Ambulatory Visit: Payer: 59 | Admitting: *Deleted

## 2021-09-30 ENCOUNTER — Ambulatory Visit (INDEPENDENT_AMBULATORY_CARE_PROVIDER_SITE_OTHER): Payer: Self-pay | Admitting: Physician Assistant

## 2021-09-30 VITALS — BP 177/81 | HR 53 | Resp 20 | Ht 67.0 in

## 2021-09-30 VITALS — BP 132/84 | HR 86 | Ht 67.0 in | Wt 172.0 lb

## 2021-09-30 DIAGNOSIS — Z8673 Personal history of transient ischemic attack (TIA), and cerebral infarction without residual deficits: Secondary | ICD-10-CM

## 2021-09-30 DIAGNOSIS — I69354 Hemiplegia and hemiparesis following cerebral infarction affecting left non-dominant side: Secondary | ICD-10-CM

## 2021-09-30 DIAGNOSIS — I25810 Atherosclerosis of coronary artery bypass graft(s) without angina pectoris: Secondary | ICD-10-CM | POA: Diagnosis not present

## 2021-09-30 DIAGNOSIS — Z951 Presence of aortocoronary bypass graft: Secondary | ICD-10-CM

## 2021-09-30 DIAGNOSIS — D151 Benign neoplasm of heart: Secondary | ICD-10-CM | POA: Diagnosis not present

## 2021-09-30 NOTE — Patient Instructions (Signed)
Continue aspirin 81 mg daily  and atorvastatin  for secondary stroke prevention  Continue to follow up with PCP regarding cholesterol and blood pressure and diabetes management  Maintain strict control of hypertension with blood pressure goal below 130/90, diabetes with hemoglobin A1c goal below 7% and cholesterol with LDL cholesterol (bad cholesterol) goal below 70 mg/dL.       Followup in the future with me in 6 months or call earlier if needed       Thank you for coming to see [place] at [facility]. I hope we have been able to provide you high quality care today.  You may receive a patient satisfaction survey over the next few weeks. We would appreciate your feedback and comments so that we may continue to improve ourselves and the health of our patients.

## 2021-09-30 NOTE — Progress Notes (Signed)
[facility] [address] [place]. [place] [address] [phone]       HOSPITAL FOLLOW UP NOTE  [name] Date of Birth:  03/30/1956 Medical Record Number:  [id number]   Reason for Referral:  hospital stroke follow up    SUBJECTIVE:   CHIEF COMPLAINT:  Chief Complaint  Patient presents with   Follow-up    RM 3 with son. Here for hospital f/u, reports she has been doing ok. No particular concerns today.     HPI:   VERDELLE [name] is a 65 y.o. female who has a past medical history of poorly controlled diabetes and hypertension, hyperlipidemia, obesity, anxiety, depression, who presented on 04/18/2021 with acute dizziness left-sided weakness and left-sided numbness [facility].  Received tPA and transferred to [facility].  Stroke work-up revealed right thalamic, right occipital left-sided splenium corpus callosum and right cerebellum infarct in setting of right P2/PCA stenosis s/p thrombectomy with TICI 3 reperfusion, embolic secondary to unknown source.  CTA head/neck 50% stenosis proximal right ICA.  EF 70 to 75% without cardiac source of embolus identified.  TEE no evidence for cardiac source of embolism but did show well-circumscribed mass in the right RCC.  Eval by cardiology with plans on surgical procedure once discharged and recommended continue DAPT until cardiac procedure.  LDL 105 -initiate atorvastatin 40 mg daily.  A1c 9.3.  Therapy eval's recommended CIR with residual left hemiparesis with hemisensory impairment.  Today, 09/30/2021, [name] is being seen for hospital follow-up accompanied by her son. She was initially recovering very well from a stroke standpoint with mild LUE >LLE numbness and ambulating with a cane. 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  Therapy eval's  recommended CIR for decreased functional mobility - d/c to CIR on 8/29.  She required incision debridement of sternal wound and application of VAC eventually discontinued on 9/29.  She was discharged home on 9/30 (after a 32 day stay).  She does endorse minor setback since that time but left-sided weakness has been gradually improving.  She is currently ambulating with a rolling walker.  She has not yet started therapies - reports they just haven't showed up yet.  Cardiology and PMR further investigating.  She is currently residing with her daughter and is eager to return back home.  Currently maintaining ADLs independently.  Denies new stroke/TIA symptoms.  Compliant on aspirin and atorvastatin without side effects.  Blood pressure today 132/84.  Routinely monitors at home and typically stable. Recent A1c 7.8 down from 9.3.  She has follow-up with cardiology this afternoon.  No further concerns at this time      ROS:   14 system review of systems performed and negative with exception of those listed in HPI  PMH:  Past Medical History:  Diagnosis Date   Anxiety    Arthritis    Cataract    left   Depression    Diabetes mellitus without complication ([place])    type 2   Environmental and seasonal allergies    GERD (gastroesophageal reflux disease)    Hyperlipidemia    Hypertension    Joint pain    as reported by patient   Right thalamic infarction ([place]) 04/29/2021   Stroke ([place]) 04/2021   admitted at [facility] - left sided weakness   Urinary incontinence    as stated  by patient    PSH:  Past Surgical History:  Procedure Laterality Date   AORTIC VALVE REPLACEMENT N/A 08/04/2021   Procedure: RESECTION AORTIC VALVE TUMOR;  Surgeon: [name], MD;  Location: [place];  Service: Open Heart Surgery;  Laterality: N/A;   APPLICATION OF WOUND VAC N/A 08/24/2021   Procedure: APPLICATION OF WOUND VAC;  Surgeon: Gaye Pollack, MD;  Location: [place] [facility];  Service: Thoracic;  Laterality: N/A;    APPLICATION OF WOUND VAC N/A 08/28/2021   Procedure: WOUND VAC CHANGE;  Surgeon: [name], MD;  Location: [place];  Service: Thoracic;  Laterality: N/A;   BREAST EXCISIONAL BIOPSY Right 1999   neg   BREAST LUMPECTOMY Right 2005   as reported by patient   BUBBLE STUDY  04/24/2021   Procedure: BUBBLE STUDY;  Surgeon: Sueanne Margarita, MD;  Location: [place];  Service: Cardiovascular;;   CATARACT EXTRACTION  12/2011   CATARACT EXTRACTION W/PHACO Left 12/02/2016   Procedure: CATARACT EXTRACTION PHACO AND INTRAOCULAR LENS PLACEMENT ([place]);  Surgeon: Estill Cotta, MD;  Location: [facility];  Service: Ophthalmology;  Laterality: Left;  [place] 2:14 AP% 28.4 CDE 59.73 Fluid pack lot # [id number]   CORONARY ARTERY BYPASS GRAFT N/A 08/04/2021   Procedure: CORONARY ARTERY BYPASS GRAFTING (CABG) X  4  USING LEFT INTERNAL MAMMARY ARTERY AND RIGHT GREATER SAPHENOUS VEIN ENDOSCOPIC CONDUITS;  Surgeon: [name], MD;  Location: [place];  Service: Open Heart Surgery;  Laterality: N/A;   DIAGNOSTIC LAPAROSCOPY     EYE SURGERY     IR CT HEAD LTD  04/19/2021   IR PERCUTANEOUS ART THROMBECTOMY/INFUSION INTRACRANIAL INC DIAG ANGIO  04/19/2021       IR PERCUTANEOUS ART THROMBECTOMY/INFUSION INTRACRANIAL INC DIAG ANGIO  04/19/2021   IR US GUIDE VASC ACCESS LEFT  04/19/2021   RADIOLOGY WITH ANESTHESIA N/A 04/19/2021   Procedure: IR WITH ANESTHESIA;  Surgeon: Luanne Bras, MD;  Location: [place];  Service: Radiology;  Laterality: N/A;   STERNAL WOUND DEBRIDEMENT N/A 08/24/2021   Procedure: STERNAL WOUND DRAINAGE AND DEBRIDEMENT;  Surgeon: Gaye Pollack, MD;  Location: [place] [facility];  Service: Thoracic;  Laterality: N/A;   STERNAL WOUND DEBRIDEMENT N/A 08/28/2021   Procedure: STERNAL WOUND DEBRIDEMENT;  Surgeon: [name], MD;  Location: [place];  Service: Thoracic;  Laterality: N/A;   TEE WITHOUT CARDIOVERSION N/A 04/24/2021   Procedure: TRANSESOPHAGEAL ECHOCARDIOGRAM (TEE);  Surgeon:  Sueanne Margarita, MD;  Location: [facility];  Service: Cardiovascular;  Laterality: N/A;   TEE WITHOUT CARDIOVERSION N/A 08/04/2021   Procedure: TRANSESOPHAGEAL ECHOCARDIOGRAM (TEE);  Surgeon: [name], MD;  Location: [place];  Service: Open Heart Surgery;  Laterality: N/A;   TOTAL HIP ARTHROPLASTY Right 04/04/2018   Procedure: TOTAL HIP ARTHROPLASTY;  Surgeon: Dereck Leep, MD;  Location: [facility];  Service: Orthopedics;  Laterality: Right;    Social History:  Social History   Socioeconomic History   Marital status: Widowed    Spouse name: Not on file   Number of children: 2   Years of education: Not on file   Highest education level: 12th grade  Occupational History    Employer: [employer]  Tobacco Use   Smoking status: Former    Packs/day: 1.50    Years: 25.00    Pack years: 37.50    Types: Cigarettes    Quit date: 03/24/2007    Years since quitting: 14.5   Smokeless tobacco: Never  Vaping Use   Vaping Use:  Never used  Substance and Sexual Activity   Alcohol use: Not Currently   Drug use: No   Sexual activity: Not Currently    Birth control/protection: Post-menopausal  Other Topics Concern   Not on file  Social History Narrative   Not on file   Social Determinants of Health   Financial Resource Strain: Not on file  Food Insecurity: Not on file  Transportation Needs: Not on file  Physical Activity: Not on file  Stress: Not on file  Social Connections: Not on file  Intimate Partner Violence: Not on file    Family History:  Family History  Problem Relation Age of Onset   Bone cancer Brother    Aneurysm Mother        brain   Diabetes Father    CAD Father    Heart failure Father    Colon cancer Father    Cancer Father    Alzheimer's disease Paternal Grandmother    Dementia Paternal Grandmother    Mental illness Paternal Grandfather    Healthy Daughter    Healthy Son    Breast cancer Neg Hx     Medications:   Current Outpatient  Medications on File Prior to Visit  Medication Sig Dispense Refill   acetaminophen (TYLENOL) 325 MG tablet Take 2 tablets (650 mg total) by mouth every 4 (four) hours as needed for mild pain (or temp > 37.5 C (99.5 F)).     ALPRAZolam (XANAX) 0.5 MG tablet Take 1 tablet (0.5 mg total) by mouth daily as needed for anxiety. 30 tablet 0   amLODipine (NORVASC) 10 MG tablet Take 1 tablet (10 mg total) by mouth daily. 30 tablet 0   aspirin 325 MG EC tablet Take 1 tablet (325 mg total) by mouth daily. 30 tablet 0   atorvastatin (LIPITOR) 40 MG tablet Take 1 tablet (40 mg total) by mouth daily. 30 tablet 0   Blood Pressure Monitoring (OMRON 3 SERIES BP MONITOR) DEVI Use as directed to monitor blood pressure 1 each 0   docusate sodium (COLACE) 50 MG capsule Take 1 capsule (50 mg total) by mouth 2 (two) times daily. 10 capsule 0   furosemide (LASIX) 40 MG tablet Take 1 tablet (40 mg total) by mouth daily. 30 tablet 0   gabapentin (NEURONTIN) 100 MG capsule Take 1 capsule (100 mg total) by mouth 2 (two) times daily. 60 capsule 0   gabapentin (NEURONTIN) 300 MG capsule Take 2 capsules (600 mg total) by mouth at bedtime. 60 capsule 0   glyBURIDE (DIABETA) 5 MG tablet Take 1 tablet (5 mg total) by mouth daily with breakfast. 30 tablet 0   lidocaine (LIDODERM) 5 % Place 1 patch onto the skin daily. Remove & Discard patch within 12 hours or as directed by MD 30 patch 0   losartan (COZAAR) 50 MG tablet Take 1 tablet (50 mg total) by mouth daily. 30 tablet 0   Magnesium Glycinate 100 MG CAPS Take 1 tablet by mouth at bedtime. 30 capsule 3   metFORMIN (GLUCOPHAGE) 500 MG tablet Take 1 tablet (500 mg total) by mouth 2 (two) times daily with a meal. 60 tablet 0   methocarbamol (ROBAXIN) 500 MG tablet Take 1 tablet (500 mg total) by mouth 2 (two) times daily as needed for muscle spasms. 60 tablet 0   Multiple Vitamin (MULTIVITAMIN) tablet Take 1 tablet by mouth daily.     pantoprazole (PROTONIX) 40 MG tablet Take 1  tablet (40 mg total) by mouth at  bedtime. 30 tablet 0   potassium chloride SA (KLOR-CON) 20 MEQ tablet Take 1 tablet (20 mEq total) by mouth daily. 30 tablet 0   pyridOXINE (B-6) 50 MG tablet Take 1 tablet (50 mg total) by mouth daily. 30 tablet 0   rOPINIRole (REQUIP) 0.25 MG tablet Take 2 tablets (0.5 mg total) by mouth at bedtime as needed. 60 tablet 3   sertraline (ZOLOFT) 100 MG tablet Take 1 tablet (100 mg total) by mouth daily. 30 tablet 0   Vitamin D, Ergocalciferol, (DRISDOL) 1.25 MG (50000 UNIT) CAPS capsule Take 1 capsule (50,000 Units total) by mouth every 7 (seven) days. 5 capsule 0   No current facility-administered medications on file prior to visit.    Allergies:   Allergies  Allergen Reactions   Jardiance [Empagliflozin] Itching    Recurrent mycotic infections   Penicillins Rash and Other (See Comments)    Has patient had a PCN reaction causing immediate rash, facial/tongue/throat swelling, SOB or lightheadedness with hypotension: No Has patient had a PCN reaction causing severe rash involving mucus membranes or skin necrosis: No Has patient had a PCN reaction that required hospitalization No Has patient had a PCN reaction occurring within the last 10 years: No If all of the above answers are "NO", then may proceed with Cephalosporin use.       OBJECTIVE:  Physical Exam  Vitals:   09/30/21 1520  BP: 132/84  Pulse: 86  SpO2: 99%  Weight: 172 lb (78 kg)  Height: 5\' 7"  (1.702 m)   Body mass index is 26.94 kg/m. No results found.   General: well developed, well nourished, very pleasant middle-age Caucasian female, seated, in no evident distress Head: head normocephalic and atraumatic.   Neck: supple with no carotid or supraclavicular bruits Cardiovascular: regular rate and rhythm, no murmurs Musculoskeletal: no deformity Skin:  no rash/petichiae Vascular:  Normal pulses all extremities   Neurologic Exam Mental Status: Awake and fully alert.  Fluent  speech and language.  Oriented to place and time. Recent and remote memory intact. Attention span, concentration and fund of knowledge appropriate. Mood and affect appropriate.  Cranial Nerves: Fundoscopic exam reveals sharp disc margins. Pupils equal, briskly reactive to light. Extraocular movements full without nystagmus. Visual fields full to confrontation. Hearing intact. Facial sensation intact. Face, tongue, palate moves normally and symmetrically.  Motor: Normal bulk and tone. Normal strength in all tested extremity muscles except mild distal LUE weakness and proximal weakness Sensory.: intact to touch , pinprick , position and vibratory sensation except "odd sensation" with light touch left upper and lower extremity Coordination: Rapid alternating movements normal in all extremities. Finger-to-nose and heel-to-shin left-sided dysmetria. Gait and Station: Arises from chair without difficulty. Stance is normal. Gait demonstrates normal stride length and balance with use of RW. Tandem walk and heel toe not attempted.  Reflexes: 1+ and symmetric. Toes downgoing.       NIHSS  2 Modified Rankin  2-3      ASSESSMENT: [name] is a 65 y.o. 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 Vascular risk factors include CABG x4 08/04/2021, HTN, HLD, and DM.      PLAN:  Embolic stroke:  Residual deficit: Left-sided weakness with dysmetria, hemisensory impairment and gait  impairment.  In the process of getting set up with home health therapies.  Discussed use of RW at all times unless otherwise instructed Continue aspirin 325 mg daily  and atorvastatin 40 mg daily for secondary stroke prevention.   Discussed  secondary stroke prevention measures and importance of close PCP follow up for aggressive stroke risk factor management. I have gone over the pathophysiology of stroke, warning signs and symptoms, risk factors and their management in some detail with instructions to go to the closest emergency room for symptoms of concern. 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 Will defer need of further cardiac monitoring to assess for atrial fibrillation to cardiology team although at this point, can hold off. Has f/u with cardiothoracic today HTN: BP goal <130/90.  Stable on current regimen per PCP HLD: LDL goal <70.  Prior LDL 105.  Continue atorvastatin 40 mg daily per PCP. Has f/u with PCP in December - wishes to hold off on any lab draws (such as lipid panel) until that visit as she is a hard stick DMII: A1c goal<7.0. Recent A1c 7.8.  Routinely monitored by PCP    Follow up in 6 months or call earlier if needed   CC:  [place] provider: Dr. Leonie Man PCP: Birdie Sons, MD    I spent 56 minutes of face-to-face and non-face-to-face time with patient and son.  This included previsit chart review including review of recent hospitalization, lab review, study review, electronic health record documentation, patient education regarding prior stroke including potential etiology, secondary stroke prevention measures and importance of managing stroke risk factors, residual deficits and typical recovery time, s/p cardiac procedure with further review of hospitalization and discussion with patient and son and answered all other questions to patient and sons satisfaction   Frann Rider, AGNP-BC  [facility] [address] [place], [place] [address]  Phone [phone] Fax [phone] Note: This document was prepared with digital dictation and possible smart phrase technology. Any transcriptional errors that result from this process are unintentional.

## 2021-09-30 NOTE — Progress Notes (Signed)
[address]       [place] [address]             [phone]      [name] is a 65 y.o. 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 Roxan Hockey.  Her hospital stay was complicated by wound infection and she had a wound VAC placed on 08/24/2021.  This was removed before she left the hospital. She ended up going to CIR for further rehab and was discharged from there on 09/12/2021.  Today, she returns for her routine 1 month follow-up appointment.  Her sternal incision looks really good today.  She does have some leftover granulation tissue but this is dry and there is no depth to the incision at this point.  I have instructed her to just cover the incision with a sterile clean 4 x 4 dressing she does not need to pack it anymore at this point.  Overall, she has been doing okay since discharge.  She was supposed to have home physical therapy and home health come out to her house after inpatient rehab but they have not come out.  We have contacted the agency and it seems they are not excepting her as a patient.  No diagnosis found. Past Medical History:  Diagnosis Date   Anxiety    Arthritis    Cataract    left   Depression    Diabetes mellitus without complication (HCC)    type 2   Environmental and seasonal allergies    GERD (gastroesophageal reflux disease)    Hyperlipidemia    Hypertension    Joint pain    as reported by patient   Right thalamic infarction ([place]) 04/29/2021   Stroke ([place]) 04/2021   admitted at [facility] - left sided weakness   Urinary incontinence    as stated by patient   No past surgical history pertinent negatives on file. Scheduled Meds: Current Outpatient Medications on File Prior to Visit  Medication Sig Dispense Refill   acetaminophen (TYLENOL) 325 MG tablet Take 2 tablets (650 mg total) by mouth every 4 (four) hours as needed for mild pain (or temp > 37.5 C (99.5 F)).      ALPRAZolam (XANAX) 0.5 MG tablet Take 1 tablet (0.5 mg total) by mouth daily as needed for anxiety. 30 tablet 0   amLODipine (NORVASC) 10 MG tablet Take 1 tablet (10 mg total) by mouth daily. 30 tablet 0   aspirin 325 MG EC tablet Take 1 tablet (325 mg total) by mouth daily. 30 tablet 0   atorvastatin (LIPITOR) 40 MG tablet Take 1 tablet (40 mg total) by mouth daily. 30 tablet 0   Blood Pressure Monitoring (OMRON 3 SERIES BP MONITOR) DEVI Use as directed to monitor blood pressure 1 each 0   docusate sodium (COLACE) 50 MG capsule Take 1 capsule (50 mg total) by mouth 2 (two) times daily. 10 capsule 0   furosemide (LASIX) 40 MG tablet Take 1 tablet (40 mg total) by mouth daily. 30 tablet 0   gabapentin (NEURONTIN) 100 MG capsule Take 1 capsule (100 mg total) by mouth 2 (two) times daily. 60 capsule 0   gabapentin (NEURONTIN) 300 MG capsule Take 2 capsules (600 mg total) by mouth at bedtime. 60 capsule 0   glyBURIDE (DIABETA) 5 MG tablet Take 1 tablet (5 mg total) by mouth daily with breakfast. 30 tablet 0   lidocaine (LIDODERM) 5 %  Place 1 patch onto the skin daily. Remove & Discard patch within 12 hours or as directed by MD 30 patch 0   losartan (COZAAR) 50 MG tablet Take 1 tablet (50 mg total) by mouth daily. 30 tablet 0   Magnesium Glycinate 100 MG CAPS Take 1 tablet by mouth at bedtime. 30 capsule 3   metFORMIN (GLUCOPHAGE) 500 MG tablet Take 1 tablet (500 mg total) by mouth 2 (two) times daily with a meal. 60 tablet 0   methocarbamol (ROBAXIN) 500 MG tablet Take 1 tablet (500 mg total) by mouth 2 (two) times daily as needed for muscle spasms. 60 tablet 0   Multiple Vitamin (MULTIVITAMIN) tablet Take 1 tablet by mouth daily.     pantoprazole (PROTONIX) 40 MG tablet Take 1 tablet (40 mg total) by mouth at bedtime. 30 tablet 0   polyethylene glycol (MIRALAX / GLYCOLAX) 17 g packet Take 17 g by mouth 2 (two) times daily. 14 each 0   potassium chloride SA (KLOR-CON) 20 MEQ tablet Take 1 tablet (20  mEq total) by mouth daily. 30 tablet 0   pyridOXINE (B-6) 50 MG tablet Take 1 tablet (50 mg total) by mouth daily. 30 tablet 0   rOPINIRole (REQUIP) 0.25 MG tablet Take 2 tablets (0.5 mg total) by mouth at bedtime as needed. 60 tablet 3   sertraline (ZOLOFT) 100 MG tablet Take 1 tablet (100 mg total) by mouth daily. 30 tablet 0   Vitamin D, Ergocalciferol, (DRISDOL) 1.25 MG (50000 UNIT) CAPS capsule Take 1 capsule (50,000 Units total) by mouth every 7 (seven) days. 5 capsule 0   No current facility-administered medications on file prior to visit.     Allergies  Allergen Reactions   Jardiance [Empagliflozin] Itching    Recurrent mycotic infections   Penicillins Rash and Other (See Comments)    Has patient had a PCN reaction causing immediate rash, facial/tongue/throat swelling, SOB or lightheadedness with hypotension: No Has patient had a PCN reaction causing severe rash involving mucus membranes or skin necrosis: No Has patient had a PCN reaction that required hospitalization No Has patient had a PCN reaction occurring within the last 10 years: No If all of the above answers are "NO", then may proceed with Cephalosporin use.    Active Problems:   * No active hospital problems. *  There were no vitals taken for this visit. Vitals:    Vitals:   09/30/21 1614  BP: (!) 177/81  Pulse: (!) 53  Resp: 20  SpO2: 99%    Cor: RRR, no murmur Pulm: CTA bilaterally and in all fields Abd: no tenderness Wound:c/d/I no drainage. EVH site well healed Ext: no edema   CLINICAL DATA:  Status post CABG   EXAM: CHEST - 2 VIEW   COMPARISON:  08/22/2021   FINDINGS: Post sternotomy changes. No focal opacity or pleural effusion. Normal cardiomediastinal silhouette. No pneumothorax.   IMPRESSION: No active cardiopulmonary disease.     Electronically Signed   By: Donavan Foil M.D.   On: 09/30/2021 17:12       Assessment & Plan  Hypertension-she states that she has a way to take  her blood pressure at home and it usually is lower.  I asked her to please take her blood pressure at home daily and record the readings.  She needs to reach out to her cardiologist and schedule an appointment for sooner than the end of January to monitor her blood pressure more closely.  Sternal wound infection requiring a  wound VAC-wound VAC was removed prior to discharge for she was sent to inpatient rehab.  She has been doing wet-to-dry dressing changes daily and the wound looks really good today.  Picture is above.  She is encouraged to just keep the incision clean with a dry 4 x 4 sterile gauze dressing.  She was given supplies today.  Wheelchair-bound with history of stroke-she needs to resume her outpatient physical therapy and also would benefit from home health nurse however our nursing staff is reached out to the home health agency and they are refusing to treat her.  Chest x-ray was reviewed with the patient.  I had no concerns.  She has no pleural effusions or a pneumothorax.  She also has stable sternal wires.  As far as activity goes she is encouraged to partake in outpatient physical therapy.  She did ask about using light weight usually 2 to 3 pounds for her exercises.  I think that would be fine to use this time.  She is somewhat limited in movement but is encouraged to do as much as she is capable of.  Plan: I do not think she needs to return to our office unless she needs assistance with her incision.  Overall she is doing well postop and I would encourage her to reach out to her cardiologist for tighter blood pressure control.    Elgie Collard 09/30/2021

## 2021-10-01 ENCOUNTER — Other Ambulatory Visit: Payer: Self-pay | Admitting: *Deleted

## 2021-10-01 ENCOUNTER — Telehealth: Payer: Self-pay

## 2021-10-01 NOTE — Patient Outreach (Signed)
[facility]) Care Management  10/01/2021  [name] [birth year] [id number]   Transition of care follow up call    Referral received:0/30/22  Initial outreach:09/15/21 Insurance: [employer]    Subjective: Successful outreach call to patient, she reports doing well, she discussed recent follow up visits with neurology and cardiac surgeon.  Patient discussed sternal wound healing well and no scheduled follow up  She discussed concern regarding need for home health physiclal therapy, per surgeon office note follow up with [facility] and they are unable to accept patient for services.  Patient agreeable that I follow up with Rehab MD office regarding seeking a different agency to send [facility] orders for physical therapy.  Discussed with patient additional follow up appointments with providers, cardiology and PCP. Patient agrees to contact [facility] to reschedule appointment in January to sooner. Assisted patient with call to PCP office to schedule sooner visit with Dr. Caryn Section post hospital discharge, stressed importance of follow up.  Encouraged patient monitoring blood pressures daily and keeping a log, she had not checked on today., she discussed blood sugar readings in a good range in the 100's,   Plan  Placed call to Dr. Ranell Patrick office able to leave a message on nurse line regarding , [place] referral at discharge to [facility] agency unable to accept/ or staff patient service I requested referral for [facility] physical therapy be sent to a different agency patient states she does not have a preference.  Patient agreeable to care coordination follow up call in the next week.    Joylene Draft, RN, BSN  [place] Management Coordinator  [phone]- Mobile [phone]- Toll Free Main Office

## 2021-10-01 NOTE — Telephone Encounter (Signed)
Just an FYI. I received a message from Landis Martins from [employer] stating the patient needs a new referral to another home health agency. [facility] is unable to accept the patient. I have contacted Randel Books

## 2021-10-02 NOTE — Progress Notes (Signed)
I agree with the above plan 

## 2021-10-05 DIAGNOSIS — Z951 Presence of aortocoronary bypass graft: Secondary | ICD-10-CM | POA: Diagnosis not present

## 2021-10-05 DIAGNOSIS — D62 Acute posthemorrhagic anemia: Secondary | ICD-10-CM | POA: Diagnosis not present

## 2021-10-05 DIAGNOSIS — D151 Benign neoplasm of heart: Secondary | ICD-10-CM | POA: Diagnosis not present

## 2021-10-05 DIAGNOSIS — T8141XD Infection following a procedure, superficial incisional surgical site, subsequent encounter: Secondary | ICD-10-CM | POA: Diagnosis not present

## 2021-10-05 DIAGNOSIS — I69354 Hemiplegia and hemiparesis following cerebral infarction affecting left non-dominant side: Secondary | ICD-10-CM | POA: Diagnosis not present

## 2021-10-05 DIAGNOSIS — I2510 Atherosclerotic heart disease of native coronary artery without angina pectoris: Secondary | ICD-10-CM | POA: Diagnosis not present

## 2021-10-05 DIAGNOSIS — I69398 Other sequelae of cerebral infarction: Secondary | ICD-10-CM | POA: Diagnosis not present

## 2021-10-05 DIAGNOSIS — I10 Essential (primary) hypertension: Secondary | ICD-10-CM | POA: Diagnosis not present

## 2021-10-05 DIAGNOSIS — M21332 Wrist drop, left wrist: Secondary | ICD-10-CM | POA: Diagnosis not present

## 2021-10-06 ENCOUNTER — Other Ambulatory Visit: Payer: Self-pay | Admitting: *Deleted

## 2021-10-06 ENCOUNTER — Other Ambulatory Visit: Payer: Self-pay | Admitting: Family Medicine

## 2021-10-06 ENCOUNTER — Telehealth: Payer: Self-pay

## 2021-10-06 ENCOUNTER — Other Ambulatory Visit: Payer: Self-pay

## 2021-10-06 DIAGNOSIS — F419 Anxiety disorder, unspecified: Secondary | ICD-10-CM

## 2021-10-06 NOTE — Patient Outreach (Signed)
[facility]) Care Management  10/06/2021  [name] 1956/10/14 [id number]   Transition of care follow up call/ Case Closure    Referral received:0/30/22  Initial outreach:09/15/21 Insurance:  [employer]    Subjective: Successful follow up call to patient, she reports doing okay on today just resting.  [facility] and initial visit on yesterday. She discussed plans to home health RN follow up also to observe sternal incision area, patient reports she will feel more comfortable having this due to prior concerns post surgery .  Reinforced continued self care management of diabetes, monitoring blood sugar reports reading today 100's, she reports not checking blood pressure on today, counseling on importance and monitoring, keeping a record and taking to provider visit in the next week, she agrees. Patient discussed attempting to enroll in benefits  online using her phone for the next year, provided contact number to enroll by telephone for questions.      Objective:   [name]  was hospitalized at [facility] 8/22-8/29/22 for  Resection of Aortic valve mass , She was discharge to Inpatient rehab 8/29-9/30/22  Debility, superficial sternal wound infection, incision debridement of sternal wound and wound Vac placement , VAC discontinued 09/11/21 with wet to dry dressing changes. Comorbidities include: Diabetes, hypertension, hyperlipidemia, obesity, Right thalamic infarction inpatient rehab, 5/17-6/14/22,  left side hemiparesis , papillary fibroelastoma of aortic valve, anxiety,  She was discharged to home on 09/12/21  with [facility] home health RN/PT/OT has equipment from previous    Plan No ongoing care management need identified will close to [facility] care management. Patient will remain with [facility] health coach program provided contact number regarding next appointment .     Joylene Draft, RN, BSN  [place] Management Coordinator  [phone]- Mobile [phone]- Toll Free Main Office

## 2021-10-06 NOTE — Telephone Encounter (Signed)
LOV: 06/20/2021  NOV: 10/15/2021   Last Refill: 09/11/2021 #30 0 Refills   Thanks,   -Mickel Baas

## 2021-10-06 NOTE — Telephone Encounter (Signed)
Sharee Pimple from [facility] called and stated the patient is open to PT. Verbal orders given for her recommendation of 2x/week for 3 weeks and 1x/week for 6 weeks. Sharee Pimple also wanted to add med management because she stated patient was confused on which medicines to take and when

## 2021-10-06 NOTE — Telephone Encounter (Signed)
Requested medication (s) are due for refill today: Yes  Requested medication (s) are on the active medication list: Yes  Last refill:  09/11/21  Future visit scheduled: Yes  Notes to clinic:  Last filled by Lauraine Rinne, [place].    Requested Prescriptions  Pending Prescriptions Disp Refills   ALPRAZolam (XANAX) 0.5 MG tablet 30 tablet 5    Sig: TAKE 1 TABLET BY MOUTH 2 TIMES DAILY AS NEEDED FOR ANXIETY.     Not Delegated - Psychiatry:  Anxiolytics/Hypnotics Failed - 10/06/2021 10:19 AM      Failed - This refill cannot be delegated      Failed - Urine Drug Screen completed in last 360 days      Passed - Valid encounter within last 6 months    Recent Outpatient Visits           3 months ago Type 2 diabetes mellitus with stage 2 chronic kidney disease, without long-term current use of insulin ([place])   [facility] Birdie Sons, MD   6 months ago Type 2 diabetes mellitus with stage 2 chronic kidney disease, without long-term current use of insulin [facility])   [place], [place]   7 months ago Sinus congestion   [facility] [place], Wendee Beavers, [place]   1 year ago Annual physical exam   [facility] [place], Clearnce Sorrel, [place]   1 year ago Suspected COVID-19 virus infection   [place], PA-C       Future Appointments             In 1 week Fisher, Kirstie Peri, MD [facility], [place]   In 1 month Fisher, Kirstie Peri, MD [facility], [facility]   In 3 months Gasper Sells, Terisa Starr, MD [place], [facility]

## 2021-10-07 ENCOUNTER — Other Ambulatory Visit: Payer: Self-pay

## 2021-10-07 ENCOUNTER — Other Ambulatory Visit: Payer: Self-pay | Admitting: *Deleted

## 2021-10-07 DIAGNOSIS — Z951 Presence of aortocoronary bypass graft: Secondary | ICD-10-CM

## 2021-10-07 MED FILL — Alprazolam Tab 0.5 MG: ORAL | 15 days supply | Qty: 30 | Fill #0 | Status: AC

## 2021-10-08 ENCOUNTER — Ambulatory Visit: Payer: 59 | Admitting: *Deleted

## 2021-10-08 DIAGNOSIS — I2510 Atherosclerotic heart disease of native coronary artery without angina pectoris: Secondary | ICD-10-CM | POA: Diagnosis not present

## 2021-10-08 DIAGNOSIS — D62 Acute posthemorrhagic anemia: Secondary | ICD-10-CM | POA: Diagnosis not present

## 2021-10-08 DIAGNOSIS — D151 Benign neoplasm of heart: Secondary | ICD-10-CM | POA: Diagnosis not present

## 2021-10-08 DIAGNOSIS — I10 Essential (primary) hypertension: Secondary | ICD-10-CM | POA: Diagnosis not present

## 2021-10-08 DIAGNOSIS — I69354 Hemiplegia and hemiparesis following cerebral infarction affecting left non-dominant side: Secondary | ICD-10-CM | POA: Diagnosis not present

## 2021-10-08 DIAGNOSIS — Z951 Presence of aortocoronary bypass graft: Secondary | ICD-10-CM | POA: Diagnosis not present

## 2021-10-08 DIAGNOSIS — I69398 Other sequelae of cerebral infarction: Secondary | ICD-10-CM | POA: Diagnosis not present

## 2021-10-08 DIAGNOSIS — T8141XD Infection following a procedure, superficial incisional surgical site, subsequent encounter: Secondary | ICD-10-CM | POA: Diagnosis not present

## 2021-10-08 DIAGNOSIS — M21332 Wrist drop, left wrist: Secondary | ICD-10-CM | POA: Diagnosis not present

## 2021-10-09 ENCOUNTER — Ambulatory Visit: Payer: 59 | Admitting: Family Medicine

## 2021-10-10 ENCOUNTER — Other Ambulatory Visit: Payer: Self-pay

## 2021-10-10 DIAGNOSIS — T8141XD Infection following a procedure, superficial incisional surgical site, subsequent encounter: Secondary | ICD-10-CM | POA: Diagnosis not present

## 2021-10-10 DIAGNOSIS — D62 Acute posthemorrhagic anemia: Secondary | ICD-10-CM | POA: Diagnosis not present

## 2021-10-10 DIAGNOSIS — I69398 Other sequelae of cerebral infarction: Secondary | ICD-10-CM | POA: Diagnosis not present

## 2021-10-10 DIAGNOSIS — Z951 Presence of aortocoronary bypass graft: Secondary | ICD-10-CM | POA: Diagnosis not present

## 2021-10-10 DIAGNOSIS — I2510 Atherosclerotic heart disease of native coronary artery without angina pectoris: Secondary | ICD-10-CM | POA: Diagnosis not present

## 2021-10-10 DIAGNOSIS — I69354 Hemiplegia and hemiparesis following cerebral infarction affecting left non-dominant side: Secondary | ICD-10-CM | POA: Diagnosis not present

## 2021-10-10 DIAGNOSIS — M21332 Wrist drop, left wrist: Secondary | ICD-10-CM | POA: Diagnosis not present

## 2021-10-10 DIAGNOSIS — I10 Essential (primary) hypertension: Secondary | ICD-10-CM | POA: Diagnosis not present

## 2021-10-10 DIAGNOSIS — D151 Benign neoplasm of heart: Secondary | ICD-10-CM | POA: Diagnosis not present

## 2021-10-13 ENCOUNTER — Telehealth: Payer: Self-pay

## 2021-10-13 ENCOUNTER — Other Ambulatory Visit: Payer: Self-pay

## 2021-10-13 NOTE — Telephone Encounter (Signed)
Sherlynn Stalls, OT from [facility] called requesting HHOT 1wk4. Orders approved and given.

## 2021-10-14 DIAGNOSIS — M21332 Wrist drop, left wrist: Secondary | ICD-10-CM | POA: Diagnosis not present

## 2021-10-14 DIAGNOSIS — Z951 Presence of aortocoronary bypass graft: Secondary | ICD-10-CM | POA: Diagnosis not present

## 2021-10-14 DIAGNOSIS — I69354 Hemiplegia and hemiparesis following cerebral infarction affecting left non-dominant side: Secondary | ICD-10-CM | POA: Diagnosis not present

## 2021-10-14 DIAGNOSIS — D151 Benign neoplasm of heart: Secondary | ICD-10-CM | POA: Diagnosis not present

## 2021-10-14 DIAGNOSIS — T8141XD Infection following a procedure, superficial incisional surgical site, subsequent encounter: Secondary | ICD-10-CM | POA: Diagnosis not present

## 2021-10-14 DIAGNOSIS — I69398 Other sequelae of cerebral infarction: Secondary | ICD-10-CM | POA: Diagnosis not present

## 2021-10-14 DIAGNOSIS — I10 Essential (primary) hypertension: Secondary | ICD-10-CM | POA: Diagnosis not present

## 2021-10-14 DIAGNOSIS — I2510 Atherosclerotic heart disease of native coronary artery without angina pectoris: Secondary | ICD-10-CM | POA: Diagnosis not present

## 2021-10-14 DIAGNOSIS — D62 Acute posthemorrhagic anemia: Secondary | ICD-10-CM | POA: Diagnosis not present

## 2021-10-15 ENCOUNTER — Other Ambulatory Visit: Payer: Self-pay

## 2021-10-15 ENCOUNTER — Ambulatory Visit (INDEPENDENT_AMBULATORY_CARE_PROVIDER_SITE_OTHER): Payer: 59 | Admitting: Family Medicine

## 2021-10-15 DIAGNOSIS — Z91199 Patient's noncompliance with other medical treatment and regimen due to unspecified reason: Secondary | ICD-10-CM

## 2021-10-15 NOTE — Progress Notes (Signed)
Appointment canceled after visit documentation started.

## 2021-10-16 ENCOUNTER — Telehealth: Payer: Self-pay

## 2021-10-16 DIAGNOSIS — I69354 Hemiplegia and hemiparesis following cerebral infarction affecting left non-dominant side: Secondary | ICD-10-CM | POA: Diagnosis not present

## 2021-10-16 DIAGNOSIS — I2510 Atherosclerotic heart disease of native coronary artery without angina pectoris: Secondary | ICD-10-CM | POA: Diagnosis not present

## 2021-10-16 DIAGNOSIS — D151 Benign neoplasm of heart: Secondary | ICD-10-CM | POA: Diagnosis not present

## 2021-10-16 DIAGNOSIS — I10 Essential (primary) hypertension: Secondary | ICD-10-CM | POA: Diagnosis not present

## 2021-10-16 DIAGNOSIS — I69398 Other sequelae of cerebral infarction: Secondary | ICD-10-CM | POA: Diagnosis not present

## 2021-10-16 DIAGNOSIS — M21332 Wrist drop, left wrist: Secondary | ICD-10-CM | POA: Diagnosis not present

## 2021-10-16 DIAGNOSIS — T8141XD Infection following a procedure, superficial incisional surgical site, subsequent encounter: Secondary | ICD-10-CM | POA: Diagnosis not present

## 2021-10-16 DIAGNOSIS — D62 Acute posthemorrhagic anemia: Secondary | ICD-10-CM | POA: Diagnosis not present

## 2021-10-16 DIAGNOSIS — Z951 Presence of aortocoronary bypass graft: Secondary | ICD-10-CM | POA: Diagnosis not present

## 2021-10-16 NOTE — Telephone Encounter (Signed)
[name] from [facility] called to inform us that he could not treat the patient today due to her blood pressure being elevated. Her first one was 186/102. The second one was 176/100. He stated that those numbers are outside of the protocol for him to treat.

## 2021-10-17 ENCOUNTER — Other Ambulatory Visit: Payer: Self-pay | Admitting: Physical Medicine and Rehabilitation

## 2021-10-17 ENCOUNTER — Other Ambulatory Visit: Payer: Self-pay

## 2021-10-17 MED ORDER — OMRON 3 SERIES BP MONITOR DEVI
0 refills | Status: DC
  Filled 2021-10-17: qty 1, 1d supply, fill #0

## 2021-10-17 MED ORDER — BLOOD GLUCOSE MONITORING SUPPL DEVI
1.0000 | Freq: Every day | 0 refills | Status: AC
  Filled 2021-10-17: qty 1, 1d supply, fill #0

## 2021-10-17 MED ORDER — LOSARTAN POTASSIUM 25 MG PO TABS
75.0000 mg | ORAL_TABLET | Freq: Every day | ORAL | 11 refills | Status: DC
  Filled 2021-10-17: qty 30, 10d supply, fill #0
  Filled 2021-10-30: qty 30, 10d supply, fill #1

## 2021-10-17 NOTE — Telephone Encounter (Signed)
Attempted to call patient to inform her that Cozaar has been increased to 75 mg. Unable to leave voicemail

## 2021-10-21 ENCOUNTER — Other Ambulatory Visit: Payer: Self-pay

## 2021-10-21 ENCOUNTER — Encounter: Payer: Self-pay | Admitting: Family Medicine

## 2021-10-21 ENCOUNTER — Telehealth: Payer: Self-pay

## 2021-10-21 ENCOUNTER — Ambulatory Visit (INDEPENDENT_AMBULATORY_CARE_PROVIDER_SITE_OTHER): Payer: 59 | Admitting: Family Medicine

## 2021-10-21 VITALS — BP 107/72 | HR 96 | Temp 98.1°F | Wt 171.0 lb

## 2021-10-21 DIAGNOSIS — D62 Acute posthemorrhagic anemia: Secondary | ICD-10-CM | POA: Diagnosis not present

## 2021-10-21 DIAGNOSIS — E871 Hypo-osmolality and hyponatremia: Secondary | ICD-10-CM | POA: Diagnosis not present

## 2021-10-21 DIAGNOSIS — G2581 Restless legs syndrome: Secondary | ICD-10-CM | POA: Diagnosis not present

## 2021-10-21 DIAGNOSIS — I10 Essential (primary) hypertension: Secondary | ICD-10-CM | POA: Diagnosis not present

## 2021-10-21 DIAGNOSIS — G8929 Other chronic pain: Secondary | ICD-10-CM | POA: Diagnosis not present

## 2021-10-21 DIAGNOSIS — E1142 Type 2 diabetes mellitus with diabetic polyneuropathy: Secondary | ICD-10-CM

## 2021-10-21 DIAGNOSIS — E669 Obesity, unspecified: Secondary | ICD-10-CM | POA: Diagnosis not present

## 2021-10-21 DIAGNOSIS — M545 Low back pain, unspecified: Secondary | ICD-10-CM | POA: Diagnosis not present

## 2021-10-21 DIAGNOSIS — Z23 Encounter for immunization: Secondary | ICD-10-CM

## 2021-10-21 DIAGNOSIS — E559 Vitamin D deficiency, unspecified: Secondary | ICD-10-CM | POA: Diagnosis not present

## 2021-10-21 DIAGNOSIS — F3341 Major depressive disorder, recurrent, in partial remission: Secondary | ICD-10-CM | POA: Diagnosis not present

## 2021-10-21 DIAGNOSIS — E7800 Pure hypercholesterolemia, unspecified: Secondary | ICD-10-CM | POA: Diagnosis not present

## 2021-10-21 DIAGNOSIS — F419 Anxiety disorder, unspecified: Secondary | ICD-10-CM | POA: Diagnosis not present

## 2021-10-21 DIAGNOSIS — T8141XD Infection following a procedure, superficial incisional surgical site, subsequent encounter: Secondary | ICD-10-CM | POA: Diagnosis not present

## 2021-10-21 NOTE — Progress Notes (Signed)
Established patient visit   Patient: [name]   DOB: 1956/07/22   65 y.o. Female  MRN: [id number] Visit Date: 10/21/2021  Today's healthcare provider: Lelon Huh, MD   Chief Complaint  Patient presents with   Hospitalization Follow-up   Subjective    HPI  Follow up Hospitalization  Patient was admitted to [facility] on 08/11/2021 and discharged on 09/12/2021. She was treated for hyperkalemia, debility and altered mental status. Treatment for this included See hospital notes. Telephone follow up was done on 09/18/2021 She reports excellent compliance with treatment. She reports this condition is improved but not back to base line. Continue home OT and PT.  -----------------------------------------------------------------------------------------    Medications: Outpatient Medications Prior to Visit  Medication Sig   acetaminophen (TYLENOL) 325 MG tablet Take 2 tablets (650 mg total) by mouth every 4 (four) hours as needed for mild pain (or temp > 37.5 C (99.5 F)).   ALPRAZolam (XANAX) 0.5 MG tablet TAKE 1 TABLET BY MOUTH 2 TIMES DAILY AS NEEDED FOR ANXIETY.   amLODipine (NORVASC) 10 MG tablet Take 1 tablet (10 mg total) by mouth daily.   aspirin 325 MG EC tablet Take 1 tablet (325 mg total) by mouth daily.   atorvastatin (LIPITOR) 40 MG tablet Take 1 tablet (40 mg total) by mouth daily.   Blood Pressure Monitoring (OMRON 3 SERIES BP MONITOR) DEVI Use as directed to monitor blood pressure   docusate sodium (COLACE) 50 MG capsule Take 1 capsule (50 mg total) by mouth 2 (two) times daily.   furosemide (LASIX) 40 MG tablet Take 1 tablet (40 mg total) by mouth daily.   gabapentin (NEURONTIN) 100 MG capsule Take 1 capsule (100 mg total) by mouth 2 (two) times daily.   gabapentin (NEURONTIN) 300 MG capsule Take 2 capsules (600 mg total) by mouth at bedtime.   glyBURIDE (DIABETA) 5 MG tablet Take 1 tablet (5 mg total) by mouth daily with breakfast.   lidocaine (LIDODERM) 5  % Place 1 patch onto the skin daily. Remove & Discard patch within 12 hours or as directed by MD   losartan (COZAAR) 25 MG tablet Take 3 tablets (75 mg total) by mouth daily.   Magnesium Glycinate 100 MG CAPS Take 1 tablet by mouth at bedtime.   metFORMIN (GLUCOPHAGE) 500 MG tablet Take 1 tablet (500 mg total) by mouth 2 (two) times daily with a meal.   methocarbamol (ROBAXIN) 500 MG tablet Take 1 tablet (500 mg total) by mouth 2 (two) times daily as needed for muscle spasms.   Multiple Vitamin (MULTIVITAMIN) tablet Take 1 tablet by mouth daily.   pantoprazole (PROTONIX) 40 MG tablet Take 1 tablet (40 mg total) by mouth at bedtime.   potassium chloride SA (KLOR-CON) 20 MEQ tablet Take 1 tablet (20 mEq total) by mouth daily.   pyridOXINE (B-6) 50 MG tablet Take 1 tablet (50 mg total) by mouth daily.   rOPINIRole (REQUIP) 0.25 MG tablet Take 2 tablets (0.5 mg total) by mouth at bedtime as needed.   Vitamin D, Ergocalciferol, (DRISDOL) 1.25 MG (50000 UNIT) CAPS capsule Take 1 capsule (50,000 Units total) by mouth every 7 (seven) days.   sertraline (ZOLOFT) 100 MG tablet Take 1 tablet (100 mg total) by mouth daily.   No facility-administered medications prior to visit.    Review of Systems  Constitutional: Negative.   Respiratory: Negative.    Cardiovascular: Negative.   Gastrointestinal: Negative.   Neurological:  Negative for dizziness, light-headedness and  headaches.      Objective    BP 107/72 (BP Location: Right Arm, Patient Position: Sitting, Cuff Size: Large)   Pulse 96   Temp 98.1 F (36.7 C) (Oral)   Wt 171 lb (77.6 kg)   SpO2 100%   BMI 26.78 kg/m    Physical Exam   General: Appearance:     Overweight female in no acute distress. Sitting in   Eyes:    PERRL, conjunctiva/corneas clear, EOM's intact       Lungs:     Clear to auscultation bilaterally, respirations unlabored  Heart:    Normal heart rate. Normal rhythm. No murmurs, rubs, or gallops.    MS:   All  extremities are intact.    Neurologic:   Awake, alert, oriented x 3. No apparent focal neurological defect.         Assessment & Plan     1. Diabetic peripheral neuropathy (HCC)  - Hemoglobin A1c  2. Acute blood loss anemia  - CBC  3. Primary hypertension Well controlled.  Continue current medications.   - TSH  4. Hypercholesterolemia without hypertriglyceridemia She is tolerating atorvastatin well with no adverse effects.   - Lipid panel - Comprehensive metabolic panel - TSH  5. Need for influenza vaccination  - Flu Vaccine QUAD High Dose(Fluad)    Generalized weakness Continues to slowly regain strength since hospitalization. Continues to require      The entirety of the information documented in the History of Present Illness, Review of Systems and Physical Exam were personally obtained by me. Portions of this information were initially documented by the CMA and reviewed by me for thoroughness and accuracy.     Lelon Huh, MD  [facility] [phone] (phone) [phone] (fax)  [place]

## 2021-10-21 NOTE — Telephone Encounter (Signed)
Per Dwaine Deter, OT with [facility]: [name]. [name] missed a in-home physical therapy visit on 10/17/2021.

## 2021-10-22 LAB — CBC
Hematocrit: 43.1 % (ref 34.0–46.6)
Hemoglobin: 14.6 g/dL (ref 11.1–15.9)
MCH: 27 pg (ref 26.6–33.0)
MCHC: 33.9 g/dL (ref 31.5–35.7)
MCV: 80 fL (ref 79–97)
Platelets: 280 10*3/uL (ref 150–450)
RBC: 5.41 x10E6/uL — ABNORMAL HIGH (ref 3.77–5.28)
RDW: 13.8 % (ref 11.7–15.4)
WBC: 4.9 10*3/uL (ref 3.4–10.8)

## 2021-10-22 LAB — COMPREHENSIVE METABOLIC PANEL
ALT: 9 IU/L (ref 0–32)
AST: 27 IU/L (ref 0–40)
Albumin/Globulin Ratio: 1.4 (ref 1.2–2.2)
Albumin: 4.4 g/dL (ref 3.8–4.8)
Alkaline Phosphatase: 70 IU/L (ref 44–121)
BUN/Creatinine Ratio: 9 — ABNORMAL LOW (ref 12–28)
BUN: 9 mg/dL (ref 8–27)
Bilirubin Total: 0.5 mg/dL (ref 0.0–1.2)
CO2: 24 mmol/L (ref 20–29)
Calcium: 9.5 mg/dL (ref 8.7–10.3)
Chloride: 93 mmol/L — ABNORMAL LOW (ref 96–106)
Creatinine, Ser: 1.04 mg/dL — ABNORMAL HIGH (ref 0.57–1.00)
Globulin, Total: 3.2 g/dL (ref 1.5–4.5)
Glucose: 169 mg/dL — ABNORMAL HIGH (ref 70–99)
Potassium: 4.2 mmol/L (ref 3.5–5.2)
Sodium: 134 mmol/L (ref 134–144)
Total Protein: 7.6 g/dL (ref 6.0–8.5)
eGFR: 59 mL/min/{1.73_m2} — ABNORMAL LOW (ref >59)

## 2021-10-22 LAB — HEMOGLOBIN A1C
Est. average glucose Bld gHb Est-mCnc: 151 mg/dL
Hgb A1c MFr Bld: 6.9 % — ABNORMAL HIGH (ref 4.8–5.6)

## 2021-10-22 LAB — LIPID PANEL
Chol/HDL Ratio: 4.5 ratio — ABNORMAL HIGH (ref 0.0–4.4)
Cholesterol, Total: 206 mg/dL — ABNORMAL HIGH (ref 100–199)
HDL: 46 mg/dL (ref >39)
LDL Chol Calc (NIH): 133 mg/dL — ABNORMAL HIGH (ref 0–99)
Triglycerides: 149 mg/dL (ref 0–149)
VLDL Cholesterol Cal: 27 mg/dL (ref 5–40)

## 2021-10-22 LAB — TSH: TSH: 1.8 u[IU]/mL (ref 0.450–4.500)

## 2021-10-23 DIAGNOSIS — G2581 Restless legs syndrome: Secondary | ICD-10-CM | POA: Diagnosis not present

## 2021-10-23 DIAGNOSIS — F419 Anxiety disorder, unspecified: Secondary | ICD-10-CM | POA: Diagnosis not present

## 2021-10-23 DIAGNOSIS — E669 Obesity, unspecified: Secondary | ICD-10-CM | POA: Diagnosis not present

## 2021-10-23 DIAGNOSIS — E871 Hypo-osmolality and hyponatremia: Secondary | ICD-10-CM | POA: Diagnosis not present

## 2021-10-23 DIAGNOSIS — G8929 Other chronic pain: Secondary | ICD-10-CM | POA: Diagnosis not present

## 2021-10-23 DIAGNOSIS — E559 Vitamin D deficiency, unspecified: Secondary | ICD-10-CM | POA: Diagnosis not present

## 2021-10-23 DIAGNOSIS — M545 Low back pain, unspecified: Secondary | ICD-10-CM | POA: Diagnosis not present

## 2021-10-23 DIAGNOSIS — T8141XD Infection following a procedure, superficial incisional surgical site, subsequent encounter: Secondary | ICD-10-CM | POA: Diagnosis not present

## 2021-10-23 DIAGNOSIS — F3341 Major depressive disorder, recurrent, in partial remission: Secondary | ICD-10-CM | POA: Diagnosis not present

## 2021-10-26 DIAGNOSIS — E1165 Type 2 diabetes mellitus with hyperglycemia: Secondary | ICD-10-CM | POA: Diagnosis not present

## 2021-10-26 DIAGNOSIS — I69354 Hemiplegia and hemiparesis following cerebral infarction affecting left non-dominant side: Secondary | ICD-10-CM | POA: Diagnosis not present

## 2021-10-26 DIAGNOSIS — I639 Cerebral infarction, unspecified: Secondary | ICD-10-CM | POA: Diagnosis not present

## 2021-10-27 DIAGNOSIS — G8929 Other chronic pain: Secondary | ICD-10-CM | POA: Diagnosis not present

## 2021-10-27 DIAGNOSIS — E669 Obesity, unspecified: Secondary | ICD-10-CM | POA: Diagnosis not present

## 2021-10-27 DIAGNOSIS — E871 Hypo-osmolality and hyponatremia: Secondary | ICD-10-CM | POA: Diagnosis not present

## 2021-10-27 DIAGNOSIS — M545 Low back pain, unspecified: Secondary | ICD-10-CM | POA: Diagnosis not present

## 2021-10-27 DIAGNOSIS — F419 Anxiety disorder, unspecified: Secondary | ICD-10-CM | POA: Diagnosis not present

## 2021-10-27 DIAGNOSIS — E559 Vitamin D deficiency, unspecified: Secondary | ICD-10-CM | POA: Diagnosis not present

## 2021-10-27 DIAGNOSIS — F3341 Major depressive disorder, recurrent, in partial remission: Secondary | ICD-10-CM | POA: Diagnosis not present

## 2021-10-27 DIAGNOSIS — G2581 Restless legs syndrome: Secondary | ICD-10-CM | POA: Diagnosis not present

## 2021-10-27 DIAGNOSIS — T8141XD Infection following a procedure, superficial incisional surgical site, subsequent encounter: Secondary | ICD-10-CM | POA: Diagnosis not present

## 2021-10-30 ENCOUNTER — Other Ambulatory Visit: Payer: Self-pay

## 2021-10-30 ENCOUNTER — Other Ambulatory Visit: Payer: Self-pay | Admitting: Family Medicine

## 2021-10-30 ENCOUNTER — Ambulatory Visit: Payer: Self-pay | Admitting: *Deleted

## 2021-10-30 MED ORDER — SERTRALINE HCL 100 MG PO TABS
100.0000 mg | ORAL_TABLET | Freq: Every day | ORAL | 4 refills | Status: DC
  Filled 2021-10-30: qty 90, 90d supply, fill #0

## 2021-10-30 MED ORDER — VITAMIN D (ERGOCALCIFEROL) 1.25 MG (50000 UNIT) PO CAPS
50000.0000 [IU] | ORAL_CAPSULE | ORAL | 4 refills | Status: DC
  Filled 2021-10-30: qty 12, 84d supply, fill #0

## 2021-10-30 MED ORDER — METHOCARBAMOL 500 MG PO TABS
500.0000 mg | ORAL_TABLET | Freq: Two times a day (BID) | ORAL | 5 refills | Status: DC | PRN
  Filled 2021-10-30: qty 60, 30d supply, fill #0

## 2021-10-30 MED ORDER — GABAPENTIN 100 MG PO CAPS
100.0000 mg | ORAL_CAPSULE | Freq: Two times a day (BID) | ORAL | 5 refills | Status: DC
  Filled 2021-10-30: qty 60, 30d supply, fill #0

## 2021-10-30 MED ORDER — GABAPENTIN 300 MG PO CAPS
600.0000 mg | ORAL_CAPSULE | Freq: Every day | ORAL | 5 refills | Status: DC
  Filled 2021-10-30: qty 60, 30d supply, fill #0

## 2021-10-30 MED FILL — Alprazolam Tab 0.5 MG: ORAL | 15 days supply | Qty: 30 | Fill #1 | Status: AC

## 2021-10-30 NOTE — Telephone Encounter (Signed)
FYI

## 2021-10-30 NOTE — Telephone Encounter (Signed)
Pt reports productive cough x 2 weeks, worsening. States "Cough so hard I throw up, usually just liquid." States cough productive for yellow greenish phlegm. Denies fever, no wheezing. Did have rusty colored nasal drainage. Has not covid tested. Advised UC. States will follow disposition. Care advise given per protocol, verbalizes understanding.

## 2021-10-30 NOTE — Telephone Encounter (Signed)
Reason for Disposition  SEVERE coughing spells (e.g., whooping sound after coughing, vomiting after coughing)  Answer Assessment - Initial Assessment Questions 1. ONSET: "When did the cough begin?"      2 weeks 2. SEVERITY: "How bad is the cough today?"      "Throws up liquid cough so hard," 3. SPUTUM: "Describe the color of your sputum" (none, dry cough; clear, white, yellow, green)     Thick yellowish green 4. HEMOPTYSIS: "Are you coughing up any blood?" If so ask: "How much?" (flecks, streaks, tablespoons, etc.)     no 5. DIFFICULTY BREATHING: "Are you having difficulty breathing?" If Yes, ask: "How bad is it?" (e.g., mild, moderate, severe)    - MILD: No SOB at rest, mild SOB with walking, speaks normally in sentences, can lie down, no retractions, pulse < 100.    - MODERATE: SOB at rest, SOB with minimal exertion and prefers to sit, cannot lie down flat, speaks in phrases, mild retractions, audible wheezing, pulse 100-120.    - SEVERE: Very SOB at rest, speaks in single words, struggling to breathe, sitting hunched forward, retractions, pulse > 120      No 6. FEVER: "Do you have a fever?" If Yes, ask: "What is your temperature, how was it measured, and when did it start?"     no 7. CARDIAC HISTORY: "Do you have any history of heart disease?" (e.g., heart attack, congestive heart failure)      yes 8. LUNG HISTORY: "Do you have any history of lung disease?"  (e.g., pulmonary embolus, asthma, emphysema)     *No Answer* 9. PE RISK FACTORS: "Do you have a history of blood clots?" (or: recent major surgery, recent prolonged travel, bedridden)     *No Answer* 10. OTHER SYMPTOMS: "Do you have any other symptoms?" (e.g., runny nose, wheezing, chest pain)       no  Protocols used: Cough - Acute Productive-A-AH

## 2021-10-31 ENCOUNTER — Telehealth: Payer: Self-pay

## 2021-10-31 ENCOUNTER — Other Ambulatory Visit: Payer: Self-pay | Admitting: Physical Medicine and Rehabilitation

## 2021-10-31 DIAGNOSIS — M21332 Wrist drop, left wrist: Secondary | ICD-10-CM | POA: Diagnosis not present

## 2021-10-31 DIAGNOSIS — I2510 Atherosclerotic heart disease of native coronary artery without angina pectoris: Secondary | ICD-10-CM | POA: Diagnosis not present

## 2021-10-31 DIAGNOSIS — I69354 Hemiplegia and hemiparesis following cerebral infarction affecting left non-dominant side: Secondary | ICD-10-CM | POA: Diagnosis not present

## 2021-10-31 DIAGNOSIS — D62 Acute posthemorrhagic anemia: Secondary | ICD-10-CM | POA: Diagnosis not present

## 2021-10-31 DIAGNOSIS — Z951 Presence of aortocoronary bypass graft: Secondary | ICD-10-CM | POA: Diagnosis not present

## 2021-10-31 DIAGNOSIS — T8141XD Infection following a procedure, superficial incisional surgical site, subsequent encounter: Secondary | ICD-10-CM | POA: Diagnosis not present

## 2021-10-31 DIAGNOSIS — I10 Essential (primary) hypertension: Secondary | ICD-10-CM | POA: Diagnosis not present

## 2021-10-31 DIAGNOSIS — D151 Benign neoplasm of heart: Secondary | ICD-10-CM | POA: Diagnosis not present

## 2021-10-31 DIAGNOSIS — I69398 Other sequelae of cerebral infarction: Secondary | ICD-10-CM | POA: Diagnosis not present

## 2021-10-31 MED ORDER — GUAIFENESIN ER 600 MG PO TB12
600.0000 mg | ORAL_TABLET | Freq: Two times a day (BID) | ORAL | 2 refills | Status: DC
  Filled 2021-10-31: qty 60, 30d supply, fill #0

## 2021-10-31 NOTE — Telephone Encounter (Signed)
Sherlynn Stalls, OT from [place] called to get verbal orders for OT for patient. She wants it extended to 2x/week for 4 weeks. Verbal orders given.

## 2021-11-03 ENCOUNTER — Other Ambulatory Visit: Payer: Self-pay

## 2021-11-03 DIAGNOSIS — E871 Hypo-osmolality and hyponatremia: Secondary | ICD-10-CM | POA: Diagnosis not present

## 2021-11-03 DIAGNOSIS — E669 Obesity, unspecified: Secondary | ICD-10-CM | POA: Diagnosis not present

## 2021-11-03 DIAGNOSIS — F3341 Major depressive disorder, recurrent, in partial remission: Secondary | ICD-10-CM | POA: Diagnosis not present

## 2021-11-03 DIAGNOSIS — G8929 Other chronic pain: Secondary | ICD-10-CM | POA: Diagnosis not present

## 2021-11-03 DIAGNOSIS — F419 Anxiety disorder, unspecified: Secondary | ICD-10-CM | POA: Diagnosis not present

## 2021-11-03 DIAGNOSIS — G2581 Restless legs syndrome: Secondary | ICD-10-CM | POA: Diagnosis not present

## 2021-11-03 DIAGNOSIS — T8141XD Infection following a procedure, superficial incisional surgical site, subsequent encounter: Secondary | ICD-10-CM | POA: Diagnosis not present

## 2021-11-03 DIAGNOSIS — M545 Low back pain, unspecified: Secondary | ICD-10-CM | POA: Diagnosis not present

## 2021-11-03 DIAGNOSIS — E559 Vitamin D deficiency, unspecified: Secondary | ICD-10-CM | POA: Diagnosis not present

## 2021-11-05 ENCOUNTER — Telehealth: Payer: Self-pay

## 2021-11-05 ENCOUNTER — Encounter: Payer: Self-pay | Admitting: Emergency Medicine

## 2021-11-05 ENCOUNTER — Ambulatory Visit
Admission: EM | Admit: 2021-11-05 | Discharge: 2021-11-05 | Disposition: A | Discharge status: Home / Self Care | Payer: 59 | Attending: Internal Medicine | Admitting: Internal Medicine

## 2021-11-05 ENCOUNTER — Other Ambulatory Visit: Payer: Self-pay

## 2021-11-05 ENCOUNTER — Ambulatory Visit (INDEPENDENT_AMBULATORY_CARE_PROVIDER_SITE_OTHER): Payer: 59

## 2021-11-05 DIAGNOSIS — R059 Cough, unspecified: Secondary | ICD-10-CM | POA: Diagnosis not present

## 2021-11-05 DIAGNOSIS — J189 Pneumonia, unspecified organism: Secondary | ICD-10-CM

## 2021-11-05 MED ORDER — CEFDINIR 300 MG PO CAPS: 300.0000 mg | ORAL_CAPSULE | Freq: Two times a day (BID) | ORAL | 0 refills | Status: DC

## 2021-11-05 MED ORDER — ALBUTEROL SULFATE HFA 108 (90 BASE) MCG/ACT IN AERS: 2.0000 | INHALATION_SPRAY | RESPIRATORY_TRACT | 0 refills | Status: DC | PRN

## 2021-11-05 NOTE — Telephone Encounter (Signed)
Sherlynn Stalls called and stated patient declined OT today due to not feeling well

## 2021-11-05 NOTE — ED Triage Notes (Signed)
Pt here with productive cough x 2 weeks with congestion and sinus pressure for same length of time.

## 2021-11-05 NOTE — Discharge Instructions (Addendum)
Make sure to have your chest xray repeated in 6-8 weeks to make sure it has resolved

## 2021-11-05 NOTE — ED Provider Notes (Signed)
Roderic Palau    CSN: [id number] Arrival date & time: 11/05/21  1743      History   Chief Complaint Chief Complaint  Patient presents with   Cough   Nasal Congestion    HPI [name] is a 65 y.o. female who presents with unresolved cough x 2 weeks and sinus pressure. The cough is productive with green mucous. Has been blowing green mucous from her nose. Her daughter has pneumonia and was in the hospital last week. Thinks she had a fever    Past Medical History:  Diagnosis Date   Anxiety    Arthritis    Cataract    left   Depression    Diabetes mellitus without complication ([place])    type 2   Environmental and seasonal allergies    GERD (gastroesophageal reflux disease)    Hyperlipidemia    Hypertension    Joint pain    as reported by patient   Right thalamic infarction ([place]) 04/29/2021   Stroke ([place]) 04/2021   admitted at [facility] - left sided weakness   Urinary incontinence    as stated by patient    Patient Active Problem List   Diagnosis Date Noted   Subcutaneous nodule    Acute blood loss anemia    Diabetic peripheral neuropathy ([place])    Debility 08/11/2021   S/P CABG x 4 08/04/2021   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   Hyperkalemia    Chronic bilateral low back pain without sciatica    Leukocytosis    Hyponatremia    Essential hypertension    Slow transit constipation    Hemiparesis affecting left side as late effect of stroke ([place])    Aortic valve mass    History of CVA with residual deficit 04/19/2021   Dyslipidemia, goal LDL below 70 04/18/2021   Hypertensive urgency 04/18/2021   Recurrent major depressive disorder, in partial remission ([place]) 09/20/2020   Status post total replacement of hip 04/04/2018   Primary osteoarthritis of right hip 02/20/2018   Allergic rhinitis 10/28/2015   Acute stress disorder 08/01/2015   Anxiety 08/01/2015   Clinical depression 08/01/2015   Diabetes ([place]) 08/01/2015   Diverticulosis  of colon 08/01/2015   Generalized pruritus 08/01/2015   BP (high blood pressure) 08/01/2015   Cannot sleep 08/01/2015   Adiposity 08/01/2015   Detrusor muscle hypertonia 08/01/2015   Hypercholesterolemia without hypertriglyceridemia 08/01/2015   Female stress incontinence 08/01/2015   Avitaminosis D 08/01/2015    Past Surgical History:  Procedure Laterality Date   AORTIC VALVE REPLACEMENT N/A 08/04/2021   Procedure: RESECTION AORTIC VALVE TUMOR;  Surgeon: [name], MD;  Location: [facility];  Service: Open Heart Surgery;  Laterality: N/A;   APPLICATION OF WOUND VAC N/A 08/24/2021   Procedure: APPLICATION OF WOUND VAC;  Surgeon: Gaye Pollack, MD;  Location: [facility];  Service: Thoracic;  Laterality: N/A;   APPLICATION OF WOUND VAC N/A 08/28/2021   Procedure: WOUND VAC CHANGE;  Surgeon: [name], MD;  Location: [facility];  Service: Thoracic;  Laterality: N/A;   BREAST EXCISIONAL BIOPSY Right 1999   neg   BREAST LUMPECTOMY Right 2005   as reported by patient   BUBBLE STUDY  04/24/2021   Procedure: BUBBLE STUDY;  Surgeon: Sueanne Margarita, MD;  Location: [facility];  Service: Cardiovascular;;   CATARACT EXTRACTION  12/2011   CATARACT EXTRACTION W/PHACO Left 12/02/2016   Procedure: CATARACT EXTRACTION PHACO AND INTRAOCULAR LENS PLACEMENT ([place]);  Surgeon:  Estill Cotta, MD;  Location: [facility];  Service: Ophthalmology;  Laterality: Left;  [place] 2:14 AP% 28.4 CDE 59.73 Fluid pack lot # [id number]   CORONARY ARTERY BYPASS GRAFT N/A 08/04/2021   Procedure: CORONARY ARTERY BYPASS GRAFTING (CABG) X  4  USING LEFT INTERNAL MAMMARY ARTERY AND RIGHT GREATER SAPHENOUS VEIN ENDOSCOPIC CONDUITS;  Surgeon: [name], MD;  Location: [facility];  Service: Open Heart Surgery;  Laterality: N/A;   DIAGNOSTIC LAPAROSCOPY     EYE SURGERY     IR CT HEAD LTD  04/19/2021   IR PERCUTANEOUS ART THROMBECTOMY/INFUSION INTRACRANIAL INC DIAG ANGIO  04/19/2021       IR PERCUTANEOUS ART  THROMBECTOMY/INFUSION INTRACRANIAL INC DIAG ANGIO  04/19/2021   IR US GUIDE VASC ACCESS LEFT  04/19/2021   RADIOLOGY WITH ANESTHESIA N/A 04/19/2021   Procedure: IR WITH ANESTHESIA;  Surgeon: Luanne Bras, MD;  Location: [facility];  Service: Radiology;  Laterality: N/A;   STERNAL WOUND DEBRIDEMENT N/A 08/24/2021   Procedure: STERNAL WOUND DRAINAGE AND DEBRIDEMENT;  Surgeon: Gaye Pollack, MD;  Location: [facility];  Service: Thoracic;  Laterality: N/A;   STERNAL WOUND DEBRIDEMENT N/A 08/28/2021   Procedure: STERNAL WOUND DEBRIDEMENT;  Surgeon: [name], MD;  Location: [facility];  Service: Thoracic;  Laterality: N/A;   TEE WITHOUT CARDIOVERSION N/A 04/24/2021   Procedure: TRANSESOPHAGEAL ECHOCARDIOGRAM (TEE);  Surgeon: Sueanne Margarita, MD;  Location[facility];  Service: Cardiovascular;  Laterality: N/A;   TEE WITHOUT CARDIOVERSION N/A 08/04/2021   Procedure: TRANSESOPHAGEAL ECHOCARDIOGRAM (TEE);  Surgeon: [name], MD;  Location: [facility];  Service: Open Heart Surgery;  Laterality: N/A;   TOTAL HIP ARTHROPLASTY Right 04/04/2018   Procedure: TOTAL HIP ARTHROPLASTY;  Surgeon: Dereck Leep, MD;  Location: [facility];  Service: Orthopedics;  Laterality: Right;    OB History   No obstetric history on file.      Home Medications    Prior to Admission medications   Medication Sig Start Date End Date Taking? Authorizing Provider  albuterol (VENTOLIN HFA) 108 (90 Base) MCG/ACT inhaler Inhale 2 puffs into the lungs every 4 (four) hours as needed for wheezing or shortness of breath. 11/05/21  Yes Rodriguez-Southworth, Sunday Spillers, PA-C  cefdinir (OMNICEF) 300 MG capsule Take 1 capsule (300 mg total) by mouth 2 (two) times daily. 11/05/21  Yes Rodriguez-Southworth, Sunday Spillers, PA-C  acetaminophen (TYLENOL) 325 MG tablet Take 2 tablets (650 mg total) by mouth every 4 (four) hours as needed for mild pain (or temp > 37.5 C (99.5 F)). 04/29/21   Olivencia-Simmons, Prince Rome, NP  ALPRAZolam  (XANAX) 0.5 MG tablet TAKE 1 TABLET BY MOUTH 2 TIMES DAILY AS NEEDED FOR ANXIETY. 10/07/21   Birdie Sons, MD  amLODipine (NORVASC) 10 MG tablet Take 1 tablet (10 mg total) by mouth daily. 09/11/21   Angiulli, Lavon Paganini, PA-C  aspirin 325 MG EC tablet Take 1 tablet (325 mg total) by mouth daily. 09/26/21   Raulkar, Clide Deutscher, MD  atorvastatin (LIPITOR) 40 MG tablet Take 1 tablet (40 mg total) by mouth daily. 09/11/21   Angiulli, Lavon Paganini, PA-C  Blood Pressure Monitoring (OMRON 3 SERIES BP MONITOR) DEVI Use as directed to monitor blood pressure 10/17/21   Raulkar, Clide Deutscher, MD  docusate sodium (COLACE) 50 MG capsule Take 1 capsule (50 mg total) by mouth 2 (two) times daily. 09/11/21   Angiulli, Lavon Paganini, PA-C  furosemide (LASIX) 40 MG tablet Take 1 tablet (40 mg total) by mouth daily. 09/11/21  Angiulli, Lavon Paganini, PA-C  gabapentin (NEURONTIN) 100 MG capsule Take 1 capsule (100 mg total) by mouth 2 (two) times daily. 10/30/21   Birdie Sons, MD  gabapentin (NEURONTIN) 300 MG capsule Take 2 capsules (600 mg total) by mouth at bedtime. 10/30/21   Birdie Sons, MD  glyBURIDE (DIABETA) 5 MG tablet Take 1 tablet (5 mg total) by mouth daily with breakfast. 09/11/21   Angiulli, Lavon Paganini, PA-C  guaiFENesin (MUCINEX) 600 MG 12 hr tablet Take 1 tablet (600 mg total) by mouth 2 (two) times daily. 10/31/21 10/31/22  Raulkar, Clide Deutscher, MD  lidocaine (LIDODERM) 5 % Place 1 patch onto the skin daily. Remove & Discard patch within 12 hours or as directed by MD 09/11/21   Angiulli, Lavon Paganini, PA-C  losartan (COZAAR) 25 MG tablet Take 3 tablets (75 mg total) by mouth daily. 10/17/21 10/17/22  Izora Ribas, MD  Magnesium Glycinate 100 MG CAPS Take 1 tablet by mouth at bedtime. 09/26/21   Raulkar, Clide Deutscher, MD  metFORMIN (GLUCOPHAGE) 500 MG tablet Take 1 tablet (500 mg total) by mouth 2 (two) times daily with a meal. 09/11/21   Angiulli, Lavon Paganini, PA-C  methocarbamol (ROBAXIN) 500 MG tablet Take 1 tablet (500 mg  total) by mouth 2 (two) times daily as needed for muscle spasms. 10/30/21   Birdie Sons, MD  Multiple Vitamin (MULTIVITAMIN) tablet Take 1 tablet by mouth daily.    [provider]  pantoprazole (PROTONIX) 40 MG tablet Take 1 tablet (40 mg total) by mouth at bedtime. 09/11/21   Angiulli, Lavon Paganini, PA-C  potassium chloride SA (KLOR-CON) 20 MEQ tablet Take 1 tablet (20 mEq total) by mouth daily. 09/11/21   Angiulli, Lavon Paganini, PA-C  pyridOXINE (B-6) 50 MG tablet Take 1 tablet (50 mg total) by mouth daily. 09/11/21   Angiulli, Lavon Paganini, PA-C  rOPINIRole (REQUIP) 0.25 MG tablet Take 2 tablets (0.5 mg total) by mouth at bedtime as needed. 09/26/21 09/26/22  Izora Ribas, MD  sertraline (ZOLOFT) 100 MG tablet Take 1 tablet (100 mg total) by mouth daily. 10/30/21   Birdie Sons, MD  Vitamin D, Ergocalciferol, (DRISDOL) 1.25 MG (50000 UNIT) CAPS capsule Take 1 capsule (50,000 Units total) by mouth every 7 (seven) days. 10/30/21   Birdie Sons, MD    Family History Family History  Problem Relation Age of Onset   Bone cancer Brother    Aneurysm Mother        brain   Diabetes Father    CAD Father    Heart failure Father    Colon cancer Father    Cancer Father    Alzheimer's disease Paternal Grandmother    Dementia Paternal Grandmother    Mental illness Paternal Grandfather    Healthy Daughter    Healthy Son    Breast cancer Neg Hx     Social History Social History   Tobacco Use   Smoking status: Former    Packs/day: 1.50    Years: 25.00    Pack years: 37.50    Types: Cigarettes    Quit date: 03/24/2007    Years since quitting: 14.6   Smokeless tobacco: Never  Vaping Use   Vaping Use: Never used  Substance Use Topics   Alcohol use: Not Currently   Drug use: No     Allergies   Jardiance [empagliflozin] and Penicillins   Review of Systems Review of Systems  Constitutional:  Positive for appetite change, chills, diaphoresis,  fatigue and fever. Negative  for activity change.  HENT:  Positive for congestion, sinus pressure and sinus pain. Negative for ear discharge, ear pain, sore throat and trouble swallowing.   Eyes:  Negative for discharge.  Respiratory:  Positive for cough and shortness of breath.   Musculoskeletal:  Positive for gait problem. Negative for myalgias.  Skin:  Negative for rash.  Neurological:  Negative for headaches.  Hematological:  Negative for adenopathy.    Physical Exam Triage Vital Signs ED Triage Vitals  Enc Vitals Group     BP 11/05/21 1841 122/74     Pulse Rate 11/05/21 1841 65     Resp 11/05/21 1841 (!) 24     Temp 11/05/21 1841 97.8 F (36.6 C)     Temp Source 11/05/21 1841 Oral     SpO2 11/05/21 1841 95 %     Weight --      Height --      Head Circumference --      Peak Flow --      Pain Score 11/05/21 1843 2     Pain Loc --      Pain Edu? --      Excl. in [place]? --    No data found.  Updated Vital Signs BP 122/74   Pulse 65   Temp 97.8 F (36.6 C) (Oral)   Resp (!) 24   SpO2 95%   Visual Acuity Right Eye Distance:   Left Eye Distance:   Bilateral Distance:    Right Eye Near:   Left Eye Near:    Bilateral Near:      Physical Exam Vitals signs and nursing note reviewed.  Constitutional:      General: She is not in acute distress.    Appearance: Normal appearance. She is not ill-appearing, toxic-appearing or diaphoretic.  HENT:     Head: Normocephalic.     Right Ear: Tympanic membrane, ear canal and external ear normal.     Left Ear: Tympanic membrane, ear canal and external ear normal.     Nose: Nose normal.     Mouth/Throat:     Mouth: Mucous membranes are moist.  Eyes:     General: No scleral icterus.       Right eye: No discharge.        Left eye: No discharge.     Conjunctiva/sclera: Conjunctivae normal.  Neck:     Musculoskeletal: Neck supple. No neck rigidity.  Cardiovascular:     Rate and Rhythm: Normal rate and regular rhythm.     Heart sounds: No murmur.   Pulmonary:     Effort: Pulmonary effort is normal.     Breath sounds: Normal breath sounds.  Musculoskeletal: Normal range of motion.  Lymphadenopathy:     Cervical: No cervical adenopathy.  Skin:    General: Skin is warm and dry.     Coloration: Skin is not jaundiced.     Findings: No rash.  Neurological:     Mental Status: She is alert and oriented to person, place, and time.     Gait: Gait normal.  Psychiatric:        Mood and Affect: Mood normal.        Behavior: Behavior normal.        Thought Content: Thought content normal.        Judgment: Judgment normal.    UC Treatments / Results  Labs (all labs ordered are listed, but only abnormal results are displayed) Labs Reviewed -  No data to display  EKG   Radiology DG Chest 2 View  Result Date: 11/05/2021 CLINICAL DATA:  Cough for 2 weeks with congestion, initial encounter EXAM: CHEST - 2 VIEW COMPARISON:  09/30/2021 FINDINGS: Cardiac shadow is within normal limits. Postsurgical changes are noted. Patchy increased density is noted in the left lower lobe consistent with early infiltrate. No sizable effusion is noted. No bony abnormality is seen. IMPRESSION: Early left lower lobe infiltrate. Electronically Signed   By: Inez Catalina M.D.   On: 11/05/2021 19:08    Procedures Procedures (including critical care time)  Medications Ordered in UC Medications - No data to display  Initial Impression / Assessment and Plan / UC Course  I have reviewed the triage vital signs and the nursing notes.  Pertinent  imaging results that were available during my care of the patient were reviewed by me and considered in my medical decision making (see chart for details).  Has LLL pneumonia She was placed on Cefdinier and Albuterol inhaler as noted. Needs to have CXR repeated in 6-8 weeks with PCP.     Final Clinical Impressions(s) / UC Diagnoses   Final diagnoses:  Pneumonia of left lower lobe due to infectious organism      Discharge Instructions      Make sure to have your chest xray repeated in 6-8 weeks to make sure it has resolved      ED Prescriptions     Medication Sig Dispense Auth. Provider   cefdinir (OMNICEF) 300 MG capsule Take 1 capsule (300 mg total) by mouth 2 (two) times daily. 20 capsule Rodriguez-Southworth, Sunday Spillers, PA-C   albuterol (VENTOLIN HFA) 108 (90 Base) MCG/ACT inhaler Inhale 2 puffs into the lungs every 4 (four) hours as needed for wheezing or shortness of breath. 18 g Rodriguez-Southworth, Sunday Spillers, PA-C      PDMP not reviewed this encounter.   Shelby Mattocks, Hershal Coria 11/05/21 2034

## 2021-11-10 ENCOUNTER — Telehealth: Payer: Self-pay

## 2021-11-10 DIAGNOSIS — Z951 Presence of aortocoronary bypass graft: Secondary | ICD-10-CM | POA: Diagnosis not present

## 2021-11-10 DIAGNOSIS — D62 Acute posthemorrhagic anemia: Secondary | ICD-10-CM | POA: Diagnosis not present

## 2021-11-10 DIAGNOSIS — I10 Essential (primary) hypertension: Secondary | ICD-10-CM | POA: Diagnosis not present

## 2021-11-10 DIAGNOSIS — I69398 Other sequelae of cerebral infarction: Secondary | ICD-10-CM | POA: Diagnosis not present

## 2021-11-10 DIAGNOSIS — T8141XD Infection following a procedure, superficial incisional surgical site, subsequent encounter: Secondary | ICD-10-CM | POA: Diagnosis not present

## 2021-11-10 DIAGNOSIS — I69354 Hemiplegia and hemiparesis following cerebral infarction affecting left non-dominant side: Secondary | ICD-10-CM | POA: Diagnosis not present

## 2021-11-10 DIAGNOSIS — M21332 Wrist drop, left wrist: Secondary | ICD-10-CM | POA: Diagnosis not present

## 2021-11-10 DIAGNOSIS — D151 Benign neoplasm of heart: Secondary | ICD-10-CM | POA: Diagnosis not present

## 2021-11-10 DIAGNOSIS — I2510 Atherosclerotic heart disease of native coronary artery without angina pectoris: Secondary | ICD-10-CM | POA: Diagnosis not present

## 2021-11-10 NOTE — Telephone Encounter (Signed)
Per ER not was prescribed Cefdinir for pneumonia and recommended follow up chest XR in 6-8 weeks. Please schedule office visit in 4-6 weeks, can have sameday slot if nothing else is available.

## 2021-11-10 NOTE — Telephone Encounter (Signed)
Apt scheduled for 12/22/2021 at 10:40am   Thanks,   -Mickel Baas

## 2021-11-10 NOTE — Telephone Encounter (Signed)
Please review.  Pt was in the ER  11/05/2021   Thanks,   -Mickel Baas

## 2021-11-10 NOTE — Telephone Encounter (Signed)
Copied from [place] [id number]. Topic: Appointment Scheduling - Scheduling Inquiry for Clinic >> Nov 10, 2021 10:22 AM Valere Dross wrote: Reason for CRM: Pt called in stating she was advised to get some chest x-rays done by ED, and needed to reach out to PCP to get imaging orders. Please advise.

## 2021-11-13 DIAGNOSIS — D151 Benign neoplasm of heart: Secondary | ICD-10-CM | POA: Diagnosis not present

## 2021-11-13 DIAGNOSIS — I69354 Hemiplegia and hemiparesis following cerebral infarction affecting left non-dominant side: Secondary | ICD-10-CM | POA: Diagnosis not present

## 2021-11-13 DIAGNOSIS — I69398 Other sequelae of cerebral infarction: Secondary | ICD-10-CM | POA: Diagnosis not present

## 2021-11-13 DIAGNOSIS — Z951 Presence of aortocoronary bypass graft: Secondary | ICD-10-CM | POA: Diagnosis not present

## 2021-11-13 DIAGNOSIS — I10 Essential (primary) hypertension: Secondary | ICD-10-CM | POA: Diagnosis not present

## 2021-11-13 DIAGNOSIS — I2510 Atherosclerotic heart disease of native coronary artery without angina pectoris: Secondary | ICD-10-CM | POA: Diagnosis not present

## 2021-11-13 DIAGNOSIS — M21332 Wrist drop, left wrist: Secondary | ICD-10-CM | POA: Diagnosis not present

## 2021-11-13 DIAGNOSIS — T8141XD Infection following a procedure, superficial incisional surgical site, subsequent encounter: Secondary | ICD-10-CM | POA: Diagnosis not present

## 2021-11-13 DIAGNOSIS — D62 Acute posthemorrhagic anemia: Secondary | ICD-10-CM | POA: Diagnosis not present

## 2021-11-14 ENCOUNTER — Ambulatory Visit: Payer: 59 | Admitting: Family Medicine

## 2021-11-17 DIAGNOSIS — I69354 Hemiplegia and hemiparesis following cerebral infarction affecting left non-dominant side: Secondary | ICD-10-CM | POA: Diagnosis not present

## 2021-11-17 DIAGNOSIS — Z951 Presence of aortocoronary bypass graft: Secondary | ICD-10-CM | POA: Diagnosis not present

## 2021-11-17 DIAGNOSIS — I69398 Other sequelae of cerebral infarction: Secondary | ICD-10-CM | POA: Diagnosis not present

## 2021-11-17 DIAGNOSIS — T8141XD Infection following a procedure, superficial incisional surgical site, subsequent encounter: Secondary | ICD-10-CM | POA: Diagnosis not present

## 2021-11-17 DIAGNOSIS — I2510 Atherosclerotic heart disease of native coronary artery without angina pectoris: Secondary | ICD-10-CM | POA: Diagnosis not present

## 2021-11-17 DIAGNOSIS — D62 Acute posthemorrhagic anemia: Secondary | ICD-10-CM | POA: Diagnosis not present

## 2021-11-17 DIAGNOSIS — D151 Benign neoplasm of heart: Secondary | ICD-10-CM | POA: Diagnosis not present

## 2021-11-17 DIAGNOSIS — I10 Essential (primary) hypertension: Secondary | ICD-10-CM | POA: Diagnosis not present

## 2021-11-17 DIAGNOSIS — M21332 Wrist drop, left wrist: Secondary | ICD-10-CM | POA: Diagnosis not present

## 2021-11-19 DIAGNOSIS — D62 Acute posthemorrhagic anemia: Secondary | ICD-10-CM | POA: Diagnosis not present

## 2021-11-19 DIAGNOSIS — D151 Benign neoplasm of heart: Secondary | ICD-10-CM | POA: Diagnosis not present

## 2021-11-19 DIAGNOSIS — M21332 Wrist drop, left wrist: Secondary | ICD-10-CM | POA: Diagnosis not present

## 2021-11-19 DIAGNOSIS — T8141XD Infection following a procedure, superficial incisional surgical site, subsequent encounter: Secondary | ICD-10-CM | POA: Diagnosis not present

## 2021-11-19 DIAGNOSIS — I69398 Other sequelae of cerebral infarction: Secondary | ICD-10-CM | POA: Diagnosis not present

## 2021-11-19 DIAGNOSIS — I69354 Hemiplegia and hemiparesis following cerebral infarction affecting left non-dominant side: Secondary | ICD-10-CM | POA: Diagnosis not present

## 2021-11-19 DIAGNOSIS — Z951 Presence of aortocoronary bypass graft: Secondary | ICD-10-CM | POA: Diagnosis not present

## 2021-11-19 DIAGNOSIS — I10 Essential (primary) hypertension: Secondary | ICD-10-CM | POA: Diagnosis not present

## 2021-11-19 DIAGNOSIS — I2510 Atherosclerotic heart disease of native coronary artery without angina pectoris: Secondary | ICD-10-CM | POA: Diagnosis not present

## 2021-11-21 ENCOUNTER — Other Ambulatory Visit: Payer: Self-pay | Admitting: Family Medicine

## 2021-11-21 ENCOUNTER — Other Ambulatory Visit: Payer: Self-pay

## 2021-11-21 DIAGNOSIS — Z1231 Encounter for screening mammogram for malignant neoplasm of breast: Secondary | ICD-10-CM

## 2021-11-21 MED ORDER — FREESTYLE FREEDOM LITE W/DEVICE KIT
PACK | 0 refills | Status: DC
  Filled 2021-11-21: qty 1, fill #0
  Filled 2021-12-03: qty 1, 30d supply, fill #0

## 2021-11-23 ENCOUNTER — Encounter (HOSPITAL_BASED_OUTPATIENT_CLINIC_OR_DEPARTMENT_OTHER): Payer: Self-pay | Admitting: Nurse Practitioner

## 2021-11-23 NOTE — Progress Notes (Deleted)
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  Past Medical History    Past Medical History:  Diagnosis Date   Anxiety    Arthritis    CAD (coronary artery disease)    a. 3 vessel CAD noted on coronary CTA 04/2021, s/p CABG x4   Cataract    left   Depression    Diabetes mellitus without complication (HCC)    type 2   Environmental and seasonal allergies    GERD (gastroesophageal reflux disease)    Hyperlipidemia    Hypertension    Joint pain    as reported by patient   Papillary fibroelastoma of heart    of the aortic valve,  s/p AVR and tumor resection on 08/04/2021   Right thalamic infarction ([place]) 04/29/2021   S/P AVR (aortic valve replacement)    s/p AVR with resection of AV fibroelastoma on 08/04/2021   S/P CABG x 4    a. s/p CABG x4 LIMA to LAD, SVG to PDA, and Sequential SVG to OM and Diagonal on 8/   Stroke ([place]) 04/2021   admitted at [facility] - left sided weakness   Urinary incontinence    as stated by patient   Past Surgical History:  Procedure Laterality Date   AORTIC VALVE REPLACEMENT N/A 08/04/2021   Procedure: RESECTION AORTIC VALVE TUMOR;  Surgeon: [name], MD;  Location: [place];  Service: Open Heart Surgery;  Laterality: N/A;   APPLICATION OF WOUND VAC N/A 08/24/2021   Procedure: APPLICATION OF WOUND VAC;  Surgeon: Gaye Pollack, MD;  Location: [place] [facility];  Service: Thoracic;  Laterality: N/A;   APPLICATION OF WOUND VAC N/A 08/28/2021   Procedure: WOUND VAC CHANGE;  Surgeon: [name], MD;  Location: [place];  Service: Thoracic;  Laterality:  N/A;   BREAST EXCISIONAL BIOPSY Right 1999   neg   BREAST LUMPECTOMY Right 2005   as reported by patient   BUBBLE STUDY  04/24/2021   Procedure: BUBBLE STUDY;  Surgeon: Sueanne Margarita, MD;  Location: [place];  Service: Cardiovascular;;   CATARACT EXTRACTION  12/2011   CATARACT EXTRACTION W/PHACO Left 12/02/2016   Procedure: CATARACT EXTRACTION PHACO AND INTRAOCULAR LENS PLACEMENT ([place]);  Surgeon: Estill Cotta, MD;  Location: [facility];  Service: Ophthalmology;  Laterality: Left;  [place] 2:14 AP% 28.4 CDE 59.73 Fluid pack lot # [id number]   CORONARY ARTERY BYPASS GRAFT N/A 08/04/2021   Procedure: CORONARY ARTERY BYPASS GRAFTING (CABG) X  4  USING LEFT INTERNAL MAMMARY ARTERY AND RIGHT GREATER SAPHENOUS VEIN ENDOSCOPIC CONDUITS;  Surgeon: [name], MD;  Location: [place];  Service: Open Heart Surgery;  Laterality: N/A;   DIAGNOSTIC LAPAROSCOPY     EYE SURGERY     IR CT HEAD LTD  04/19/2021   IR PERCUTANEOUS ART THROMBECTOMY/INFUSION INTRACRANIAL INC DIAG ANGIO  04/19/2021       IR PERCUTANEOUS ART THROMBECTOMY/INFUSION INTRACRANIAL INC DIAG ANGIO  04/19/2021   IR US GUIDE VASC ACCESS LEFT  04/19/2021   RADIOLOGY WITH ANESTHESIA N/A 04/19/2021   Procedure: IR WITH ANESTHESIA;  Surgeon: Luanne Bras, MD;  Location: [place];  Service: Radiology;  Laterality: N/A;   STERNAL WOUND DEBRIDEMENT N/A 08/24/2021   Procedure: STERNAL WOUND DRAINAGE AND DEBRIDEMENT;  Surgeon: Gaye Pollack, MD;  Location: [place] [facility];  Service: Thoracic;  Laterality: N/A;   STERNAL WOUND DEBRIDEMENT N/A 08/28/2021   Procedure: STERNAL WOUND DEBRIDEMENT;  Surgeon: [name], MD;  Location: [place];  Service: Thoracic;  Laterality: N/A;   TEE WITHOUT CARDIOVERSION N/A 04/24/2021   Procedure: TRANSESOPHAGEAL ECHOCARDIOGRAM (TEE);  Surgeon: Sueanne Margarita, MD;  Location: [facility]COPY;  Service: Cardiovascular;  Laterality: N/A;   TEE WITHOUT CARDIOVERSION N/A 08/04/2021   Procedure: TRANSESOPHAGEAL  ECHOCARDIOGRAM (TEE);  Surgeon: [name], MD;  Location: [place];  Service: Open Heart Surgery;  Laterality: N/A;   TOTAL HIP ARTHROPLASTY Right 04/04/2018   Procedure: TOTAL HIP ARTHROPLASTY;  Surgeon: Dereck Leep, MD;  Location: [facility];  Service: Orthopedics;  Laterality: Right;    Allergies  Allergies  Allergen Reactions   Jardiance [Empagliflozin] Itching    Recurrent mycotic infections   Penicillins Rash and Other (See Comments)    Has patient had a PCN reaction causing immediate rash, facial/tongue/throat swelling, SOB or lightheadedness with hypotension: No Has patient had a PCN reaction causing severe rash involving mucus membranes or skin necrosis: No Has patient had a PCN reaction that required hospitalization No Has patient had a PCN reaction occurring within the last 10 years: No If all of the above answers are "NO", then may proceed with Cephalosporin use.     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  She was last seen in our office on 07/09/2021 following CVA due to embolization of papillary fibroelastoma of the aortic valve noted on prior echo/TEE and coronary CT. Coronary CT angiogram also showed three-vessel CAD. She was referred to cardiothoracic surgery and underwent AVR with resection of AV fibroelastoma and CABG x4 on 08/04/2021. EF 60 to 65%.  Her hospital stay was complicated by surgical wound infection that was treated with a wound VAC.  She saw cardiothoracic surgery in October 2022 for follow-up. Her surgical wound had healed nicely, however she was hypertensive at the time of visit; follow-up with cardiology was advised for blood pressure management.  Of note, she was recently treated for LLL pneumonia.   Presents today for follow-up.  Since her  last visit   CAD s/p CABGx4 3 vessel CAD per coronary CT angiogram 04/2021. S/p CABG x4 (LIMA to LAD, SVG to PDA, and Sequential SVG to OM and Diagonal).  Aspirin, Lipitor. H/O CVA/Papillary fibroelastoma of the aortic valve s/p tumor resection/AVR Residual left-sided hemiparesis and physical deconditioning prior stroke in the setting of papillary fibroblastoma of the aortic valve.  She is wheelchair-bound.  Outpatient physical therapy.  Home health nurse? ASA 325 mg daily, Lipitor 40 mg daily.  Hypertension Losartan 75 mg daily.  Lasix 40 mg daily.  Amlodipine 10 mg daily. Hyperlipidemia Lipitor 40 mg daily. Pneumonia PCP to follow-up chest x-ray for recent LLL pneumonia. She has an appointment scheduled for December 22, 2021. Disposition   Home Medications    Current Outpatient Medications  Medication Sig Dispense Refill   acetaminophen (TYLENOL) 325 MG tablet Take 2 tablets (650 mg total) by mouth every 4 (four) hours as needed for mild pain (or temp > 37.5 C (99.5 F)).  albuterol (VENTOLIN HFA) 108 (90 Base) MCG/ACT inhaler Inhale 2 puffs into the lungs every 4 (four) hours as needed for wheezing or shortness of breath. 18 g 0   ALPRAZolam (XANAX) 0.5 MG tablet TAKE 1 TABLET BY MOUTH 2 TIMES DAILY AS NEEDED FOR ANXIETY. 30 tablet 5   amLODipine (NORVASC) 10 MG tablet Take 1 tablet (10 mg total) by mouth daily. 30 tablet 0   aspirin 325 MG EC tablet Take 1 tablet (325 mg total) by mouth daily. 30 tablet 0   atorvastatin (LIPITOR) 40 MG tablet Take 1 tablet (40 mg total) by mouth daily. 30 tablet 0   Blood Glucose Monitoring Suppl (FREESTYLE FREEDOM LITE) w/Device KIT USE AS DIRECTED 1 kit 0   Blood Pressure Monitoring (OMRON 3 SERIES BP MONITOR) DEVI Use as directed to monitor blood pressure 1 each 0   cefdinir (OMNICEF) 300 MG capsule Take 1 capsule (300 mg total) by mouth 2 (two) times daily. 20 capsule 0   docusate sodium (COLACE) 50 MG capsule Take 1 capsule (50 mg total) by mouth  2 (two) times daily. 10 capsule 0   furosemide (LASIX) 40 MG tablet Take 1 tablet (40 mg total) by mouth daily. 30 tablet 0   gabapentin (NEURONTIN) 100 MG capsule Take 1 capsule (100 mg total) by mouth 2 (two) times daily. 60 capsule 5   gabapentin (NEURONTIN) 300 MG capsule Take 2 capsules (600 mg total) by mouth at bedtime. 60 capsule 5   glucose blood (FREESTYLE LITE) test strip Also needs Lancets. Use to check blood sugar up to four times a day for insulin dependant diabetes 100 each 12   glyBURIDE (DIABETA) 5 MG tablet Take 1 tablet (5 mg total) by mouth daily with breakfast. 30 tablet 0   guaiFENesin (MUCINEX) 600 MG 12 hr tablet Take 1 tablet (600 mg total) by mouth 2 (two) times daily. 60 tablet 2   lidocaine (LIDODERM) 5 % Place 1 patch onto the skin daily. Remove & Discard patch within 12 hours or as directed by MD 30 patch 0   losartan (COZAAR) 25 MG tablet Take 3 tablets (75 mg total) by mouth daily. 30 tablet 11   Magnesium Glycinate 100 MG CAPS Take 1 tablet by mouth at bedtime. 30 capsule 3   metFORMIN (GLUCOPHAGE) 500 MG tablet Take 1 tablet (500 mg total) by mouth 2 (two) times daily with a meal. 60 tablet 0   methocarbamol (ROBAXIN) 500 MG tablet Take 1 tablet (500 mg total) by mouth 2 (two) times daily as needed for muscle spasms. 60 tablet 5   Multiple Vitamin (MULTIVITAMIN) tablet Take 1 tablet by mouth daily.     pantoprazole (PROTONIX) 40 MG tablet Take 1 tablet (40 mg total) by mouth at bedtime. 30 tablet 0   potassium chloride SA (KLOR-CON) 20 MEQ tablet Take 1 tablet (20 mEq total) by mouth daily. 30 tablet 0   pyridOXINE (B-6) 50 MG tablet Take 1 tablet (50 mg total) by mouth daily. 30 tablet 0   rOPINIRole (REQUIP) 0.25 MG tablet Take 2 tablets (0.5 mg total) by mouth at bedtime as needed. 60 tablet 3   sertraline (ZOLOFT) 100 MG tablet Take 1 tablet (100 mg total) by mouth daily. 90 tablet 4   Vitamin D, Ergocalciferol, (DRISDOL) 1.25 MG (50000 UNIT) CAPS capsule Take  1 capsule (50,000 Units total) by mouth every 7 (seven) days. 12 capsule 4   No current facility-administered medications for this visit.     Review  of Systems    ***.  All other systems reviewed and are otherwise negative except as noted above. {The patient has an active order for outpatient cardiac rehabilitation.   Please indicate if the patient is ready to start. Do NOT delete this.  It will auto delete.  Refresh note, then sign.              Click here to document readiness and see contraindications.  :1}  Cardiac Rehabilitation Eligibility Assessment      Physical Exam    VS:  There were no vitals taken for this visit. , BMI There is no height or weight on file to calculate BMI.     GEN: Well nourished, well developed, in no acute distress. HEENT: normal. Neck: Supple, no JVD, carotid bruits, or masses. Cardiac: RRR, no murmurs, rubs, or gallops. No clubbing, cyanosis, edema.  Radials/DP/PT 2+ and equal bilaterally.  Respiratory:  Respirations regular and unlabored, clear to auscultation bilaterally. GI: Soft, nontender, nondistended, BS + x 4. MS: no deformity or atrophy. Skin: warm and dry, no rash. Neuro:  Strength and sensation are intact. Psych: Normal affect.  Accessory Clinical Findings    ECG personally reviewed by me today - *** - no acute changes.  Lab Results  Component Value Date   WBC 4.9 10/21/2021   HGB 14.6 10/21/2021   HCT 43.1 10/21/2021   MCV 80 10/21/2021   PLT 280 10/21/2021   Lab Results  Component Value Date   CREATININE 1.04 (H) 10/21/2021   BUN 9 10/21/2021   NA 134 10/21/2021   K 4.2 10/21/2021   CL 93 (L) 10/21/2021   CO2 24 10/21/2021   Lab Results  Component Value Date   ALT 9 10/21/2021   AST 27 10/21/2021   ALKPHOS 70 10/21/2021   BILITOT 0.5 10/21/2021   Lab Results  Component Value Date   CHOL 206 (H) 10/21/2021   HDL 46 10/21/2021   LDLCALC 133 (H) 10/21/2021   TRIG 149 10/21/2021   CHOLHDL 4.5 (H) 10/21/2021     Lab Results  Component Value Date   HGBA1C 6.9 (H) 10/21/2021    Assessment & Plan    1.  ***   Lenna Sciara, NP 11/24/2021, 5:42 AM

## 2021-11-24 ENCOUNTER — Ambulatory Visit (HOSPITAL_BASED_OUTPATIENT_CLINIC_OR_DEPARTMENT_OTHER): Payer: 59 | Admitting: Nurse Practitioner

## 2021-11-24 ENCOUNTER — Telehealth: Payer: Self-pay | Admitting: Physical Medicine and Rehabilitation

## 2021-11-24 NOTE — Telephone Encounter (Signed)
Ptn called and states her FMLA was cancelled - per Dr. Ranell Patrick - it was faxed in she filled it out.  We do not have copy - assumption is it has already went to scan.  I called [employer] anness[employer] scott to see if it was received left voicemail

## 2021-11-25 ENCOUNTER — Telehealth: Payer: Self-pay | Admitting: Physical Medicine and Rehabilitation

## 2021-11-25 NOTE — Telephone Encounter (Signed)
Received phone call from [employer] today and [name] told me the patient FMLA was denied because some other doctor 's office did not send some documentation.  [name]'s phone number is [phone].

## 2021-11-26 DIAGNOSIS — Z951 Presence of aortocoronary bypass graft: Secondary | ICD-10-CM | POA: Diagnosis not present

## 2021-11-26 DIAGNOSIS — I10 Essential (primary) hypertension: Secondary | ICD-10-CM | POA: Diagnosis not present

## 2021-11-26 DIAGNOSIS — T8141XD Infection following a procedure, superficial incisional surgical site, subsequent encounter: Secondary | ICD-10-CM | POA: Diagnosis not present

## 2021-11-26 DIAGNOSIS — I69398 Other sequelae of cerebral infarction: Secondary | ICD-10-CM | POA: Diagnosis not present

## 2021-11-26 DIAGNOSIS — D62 Acute posthemorrhagic anemia: Secondary | ICD-10-CM | POA: Diagnosis not present

## 2021-11-26 DIAGNOSIS — M21332 Wrist drop, left wrist: Secondary | ICD-10-CM | POA: Diagnosis not present

## 2021-11-26 DIAGNOSIS — D151 Benign neoplasm of heart: Secondary | ICD-10-CM | POA: Diagnosis not present

## 2021-11-26 DIAGNOSIS — I2510 Atherosclerotic heart disease of native coronary artery without angina pectoris: Secondary | ICD-10-CM | POA: Diagnosis not present

## 2021-11-26 DIAGNOSIS — I69354 Hemiplegia and hemiparesis following cerebral infarction affecting left non-dominant side: Secondary | ICD-10-CM | POA: Diagnosis not present

## 2021-11-28 DIAGNOSIS — I2510 Atherosclerotic heart disease of native coronary artery without angina pectoris: Secondary | ICD-10-CM | POA: Diagnosis not present

## 2021-11-28 DIAGNOSIS — Z951 Presence of aortocoronary bypass graft: Secondary | ICD-10-CM | POA: Diagnosis not present

## 2021-11-28 DIAGNOSIS — T8141XD Infection following a procedure, superficial incisional surgical site, subsequent encounter: Secondary | ICD-10-CM | POA: Diagnosis not present

## 2021-11-28 DIAGNOSIS — I10 Essential (primary) hypertension: Secondary | ICD-10-CM | POA: Diagnosis not present

## 2021-11-28 DIAGNOSIS — I69354 Hemiplegia and hemiparesis following cerebral infarction affecting left non-dominant side: Secondary | ICD-10-CM | POA: Diagnosis not present

## 2021-11-28 DIAGNOSIS — D151 Benign neoplasm of heart: Secondary | ICD-10-CM | POA: Diagnosis not present

## 2021-11-28 DIAGNOSIS — M21332 Wrist drop, left wrist: Secondary | ICD-10-CM | POA: Diagnosis not present

## 2021-11-28 DIAGNOSIS — I69398 Other sequelae of cerebral infarction: Secondary | ICD-10-CM | POA: Diagnosis not present

## 2021-11-28 DIAGNOSIS — D62 Acute posthemorrhagic anemia: Secondary | ICD-10-CM | POA: Diagnosis not present

## 2021-12-03 ENCOUNTER — Other Ambulatory Visit: Payer: Self-pay

## 2021-12-04 ENCOUNTER — Other Ambulatory Visit: Payer: Self-pay

## 2021-12-04 ENCOUNTER — Other Ambulatory Visit: Payer: Self-pay | Admitting: Family Medicine

## 2021-12-04 DIAGNOSIS — E785 Hyperlipidemia, unspecified: Secondary | ICD-10-CM

## 2021-12-04 DIAGNOSIS — F419 Anxiety disorder, unspecified: Secondary | ICD-10-CM

## 2021-12-04 NOTE — Telephone Encounter (Signed)
Requested medication (s) are due for refill today:   Provider will need to review  Requested medication (s) are on the active medication list:   Not sure  Future visit scheduled:   Yes in 2 months with Dr. Caryn Section   Last ordered: Multiple dates  Returned because this pt has called in 21 medications, several are non delegated, several are from other providers from where she was in the hospital, and she also says she can't afford to pay for 30 day supplies and needs them in 90 day supplies.   (See pt's attached note).     Requested Prescriptions  Pending Prescriptions Disp Refills   ALPRAZolam (XANAX) 0.5 MG tablet 30 tablet 5    Sig: TAKE 1 TABLET BY MOUTH 2 TIMES DAILY AS NEEDED FOR ANXIETY.     Not Delegated - Psychiatry:  Anxiolytics/Hypnotics Failed - 12/04/2021 11:47 AM      Failed - This refill cannot be delegated      Failed - Urine Drug Screen completed in last 360 days      Passed - Valid encounter within last 6 months    Recent Outpatient Visits           1 month ago Diabetic peripheral neuropathy [facility])   [facility] Birdie Sons, MD   1 month ago No-show for appointment   [facility] Birdie Sons, MD   5 months ago Type 2 diabetes mellitus with stage 2 chronic kidney disease, without long-term current use of insulin [facility])   [facility] Birdie Sons, MD   8 months ago Type 2 diabetes mellitus with stage 2 chronic kidney disease, without long-term current use of insulin [facility])   [place], [place], [place]   9 months ago Sinus congestion   [facility] [place], Fabio Bering M, [place]       Future Appointments             In 2 months Fisher, Kirstie Peri, MD [facility]             rOPINIRole (REQUIP) 0.25 MG tablet 60 tablet 3    Sig: Take 2 tablets (0.5 mg total) by mouth at bedtime as needed.     Neurology:  Parkinsonian Agents Passed - 12/04/2021 11:47  AM      Passed - Last BP in normal range    BP Readings from Last 1 Encounters:  11/05/21 122/74          Passed - Valid encounter within last 12 months    Recent Outpatient Visits           1 month ago Diabetic peripheral neuropathy [facility])   [facility] Birdie Sons, MD   1 month ago No-show for appointment   [facility] Birdie Sons, MD   5 months ago Type 2 diabetes mellitus with stage 2 chronic kidney disease, without long-term current use of insulin [facility])   [facility] Birdie Sons, MD   8 months ago Type 2 diabetes mellitus with stage 2 chronic kidney disease, without long-term current use of insulin [facility])   [place], [place]   9 months ago Sinus congestion   [facility] Trin[place] Post, [place]       Future Appointments             In 2 months Fisher, Kirstie Peri, MD [facility]  lidocaine (LIDODERM) 5 % 30 patch 0    Sig: Place 1 patch onto the skin daily. Remove & Discard patch within 12 hours or as directed by MD     Analgesics:  Topicals Passed - 12/04/2021 11:47 AM      Passed - Valid encounter within last 12 months    Recent Outpatient Visits           1 month ago Diabetic peripheral neuropathy [facility])   [facility]ie Sons, MD   1 month ago No-show for appointment   [facility] Birdie Sons, MD   5 months ago Type 2 diabetes mellitus with stage 2 chronic kidney disease, without long-term current use of insulin [facility])   [facility], MD   8 months ago Type 2 diabetes mellitus with stage 2 chronic kidney disease, without long-term current use of insulin [facility])   [place], [place]   9 months ago Sinus congestion   [facility] Trinna Post, [place]       Future Appointments             In 2  months Caryn Section, Kirstie Peri, MD [facility]             Multiple Vitamin (MULTIVITAMIN) tablet      Sig: Take 1 tablet by mouth daily.     There is no refill protocol information for this order     Magnesium Glycinate 100 MG CAPS 30 capsule 3    Sig: Take 1 tablet by mouth at bedtime.     There is no refill protocol information for this order     amLODipine (NORVASC) 10 MG tablet 30 tablet 0    Sig: Take 1 tablet (10 mg total) by mouth daily.     Cardiovascular:  Calcium Channel Blockers Passed - 12/04/2021 11:47 AM      Passed - Last BP in normal range    BP Readings from Last 1 Encounters:  11/05/21 122/74          Passed - Valid encounter within last 6 months    Recent Outpatient Visits           1 month ago Diabetic peripheral neuropathy [facility])   [facility] Birdie Sons, MD   1 month ago No-show for appointment   [facility] Birdie Sons, MD   5 months ago Type 2 diabetes mellitus with stage 2 chronic kidney disease, without long-term current use of insulin [facility])   [facility] Birdie Sons, MD   8 months ago Type 2 diabetes mellitus with stage 2 chronic kidney disease, without long-term current use of insulin [facility])   [place], [place], [place]   9 months ago Sinus congestion   [facility] [place], Wendee Beavers, [place]       Future Appointments             In 2 months Fisher, Kirstie Peri, MD [facility]             gabapentin (NEURONTIN) 100 MG capsule 60 capsule 5    Sig: Take 1 capsule (100 mg total) by mouth 2 (two) times daily.     Neurology: Anticonvulsants - gabapentin Passed - 12/04/2021 11:47 AM      Passed - Valid encounter within last 12 months    Recent Outpatient Visits  1 month ago Diabetic peripheral neuropathy [facility])   [facility] Birdie Sons, MD   1 month ago No-show for  appointment   [facility] Birdie Sons, MD   5 months ago Type 2 diabetes mellitus with stage 2 chronic kidney disease, without long-term current use of insulin [facility])   [facility] Birdie Sons, MD   8 months ago Type 2 diabetes mellitus with stage 2 chronic kidney disease, without long-term current use of insulin [facility])   [place], [place], [place]   9 months ago Sinus congestion   [facility] [place], Fabio Bering M, [place]       Future Appointments             In 2 months Fisher, Kirstie Peri, MD [facility]             potassium chloride SA (KLOR-CON M) 20 MEQ tablet 30 tablet 0    Sig: Take 1 tablet (20 mEq total) by mouth daily.     Endocrinology:  Minerals - Potassium Supplementation Failed - 12/04/2021 11:47 AM      Failed - Cr in normal range and within 360 days    Creatinine  Date Value Ref Range Status  09/06/2013 0.80 0.60 - 1.30 mg/dL Final   Creatinine, Ser  Date Value Ref Range Status  10/21/2021 1.04 (H) 0.57 - 1.00 mg/dL Final          Passed - K in normal range and within 360 days    Potassium  Date Value Ref Range Status  10/21/2021 4.2 3.5 - 5.2 mmol/L Final  09/06/2013 4.4 3.5 - 5.1 mmol/L Final          Passed - Valid encounter within last 12 months    Recent Outpatient Visits           1 month ago Diabetic peripheral neuropathy [facility])   [facility] Birdie Sons, MD   1 month ago No-show for appointment   [facility] Bird[facility] Sons, MD   5 months ago Type 2 diabetes mellitus with stage 2 chronic kidney disease, without long-term current use of insulin [facility])   [facility] Birdie Sons, MD   8 months ago Type 2 diabetes mellitus with stage 2 chronic kidney disease, without long-term current use of insulin [facility])   [place], [place], [place]   9 months ago Sinus congestion    [facility] [place], Fabio Bering M, [place]       Future Appointments             In 2 months Fisher, Kirstie Peri, MD [facility]             losartan (COZAAR) 25 MG tablet 30 tablet 11    Sig: Take 3 tablets (75 mg total) by mouth daily.     Cardiovascular:  Angiotensin Receptor Blockers Failed - 12/04/2021 11:47 AM      Failed - Cr in normal range and within 180 days    Creatinine  Date Value Ref Range Status  09/06/2013 0.80 0.60 - 1.30 mg/dL Final   Creatinine, Ser  Date Value Ref Range Status  10/21/2021 1.04 (H) 0.57 - 1.00 mg/dL Final          Passed - K in normal range and within 180 days    Potassium  Date Value Ref Range Status  10/21/2021 4.2 3.5 -  5.2 mmol/L Final  09/06/2013 4.4 3.5 - 5.1 mmol/L Final          Passed - Patient is not pregnant      Passed - Last BP in normal range    BP Readings from Last 1 Encounters:  11/05/21 122/74          Passed - Valid encounter within last 6 months    Recent Outpatient Visits           1 month ago Diabetic peripheral neuropathy [facility])   [facility] Birdie Sons, MD   1 month ago No-show for appointment   [facility] Bird[facility] Sons, MD   5 months ago Type 2 diabetes mellitus with stage 2 chronic kidney disease, without long-term current use of insulin [facility])   [facility] Birdie Sons, MD   8 months ago Type 2 diabetes mellitus with stage 2 chronic kidney disease, without long-term current use of insulin [facility])   [place], [place], [place]   9 months ago Sinus congestion   [facility] [place], Wendee Beavers, [place]       Future Appointments             In 2 months Fisher, Kirstie Peri, MD [facility]             acetaminophen (TYLENOL) 325 MG tablet      Sig: Take 2 tablets (650 mg total) by mouth every 4 (four) hours as needed for mild pain (or temp > 37.5 C (99.5  F)).     Over the Counter:  OTC Passed - 12/04/2021 11:47 AM      Passed - Valid encounter within last 12 months    Recent Outpatient Visits           1 month ago Diabetic peripheral neuropathy [facility])   [facility] Birdie Sons, MD   1 month ago No-show for appointment   [facility] Birdie Sons, MD   5 months ago Type 2 diabetes mellitus with stage 2 chronic kidney disease, without long-term current use of insulin [facility])   [facility] Birdie Sons, MD   8 months ago Type 2 diabetes mellitus with stage 2 chronic kidney disease, without long-term current use of insulin [facility])   [place], [place], [place]   9 months ago Sinus congestion   [facility] [place], Fabio Bering M, [place]       Future Appointments             In 2 months Fisher, Kirstie Peri, MD [facility]             gabapentin (NEURONTIN) 300 MG capsule 60 capsule 5    Sig: Take 2 capsules (600 mg total) by mouth at bedtime.     Neurology: Anticonvulsants - gabapentin Passed - 12/04/2021 11:47 AM      Passed - Valid encounter within last 12 months    Recent Outpatient Visits           1 month ago Diabetic peripheral neuropathy [facility])   [facility] Birdie Sons, MD   1 month ago No-show for appointment   [facility] Birdie Sons, MD   5 months ago Type 2 diabetes mellitus with stage 2 chronic kidney disease, without long-term current use of insulin [facility])   [facility] Birdie Sons, MD  8 months ago Type 2 diabetes mellitus with stage 2 chronic kidney disease, without long-term current use of insulin [facility])   [place], [place], [place]   9 months ago Sinus congestion   [facility] [place], Wendee Bea[place], [place]       Future Appointments             In 2 months Fisher, Kirstie Peri, MD [facility]             guaiFENesin (MUCINEX) 600 MG 12 hr tablet 60 tablet 2    Sig: Take 1 tablet (600 mg total) by mouth 2 (two) times daily.     Over the Counter:  OTC Passed - 12/04/2021 11:47 AM      Passed - Valid encounter within last 12 months    Recent Outpatient Visits           1 month ago Diabetic peripheral neuropathy [facility])   [facility] Birdie Sons, MD   1 month ago No-show for appointment   [facility] Birdie Sons, MD   5 months ago Type 2 diabetes mellitus with stage 2 chronic kidney disease, without long-term current use of insulin ([place])   [facility] Birdie Sons, MD   8 months ago Type 2 diabetes mellitus with stage 2 chronic kidney disease, without long-term current use of insulin [facility])   [facility] [place], [place], [place]   9 months ago Sinus congestion   [facility] [place], Wendee Bea[place], [place]       Future Appointments             In 2 months Fisher, Kirstie Peri, MD [facility]             metFORMIN (GLUCOPHAGE) 500 MG tablet 60 tablet 0    Sig: Take 1 tablet (500 mg total) by mouth 2 (two) times daily with a meal.     Endocrinology:  Diabetes - Biguanides Failed - 12/04/2021 11:47 AM      Failed - Cr in normal range and within 360 days    Creatinine  Date Value Ref Range Status  09/06/2013 0.80 0.60 - 1.30 mg/dL Final   Creatinine, Ser  Date Value Ref Range Status  10/21/2021 1.04 (H) 0.57 - 1.00 mg/dL Final          Failed - eGFR in normal range and within 360 days    EGFR (African American)  Date Value Ref Range Status  09/06/2013 >60  Final   GFR calc Af Amer  Date Value Ref Range Status  09/20/2020 81 >59 mL/min/1.73 Final    Comment:    **[facility] currently reports eGFR in compliance with the current**   recommendations of the [employer]. [facility] will   update reporting as new guidelines are published from the  NKF-ASN   Task force.    EGFR (Non-African Amer.)  Date Value Ref Range Status  09/06/2013 >60  Final    Comment:    eGFR values <87m/min/1.73 m2 may be an indication of chronic kidney disease (CKD). Calculated eGFR is useful in patients with stable renal function. The eGFR calculation will not be reliable in acutely ill patients when serum creatinine is changing rapidly. It is not useful in  patients on dialysis. The eGFR calculation may not be applicable to patients at the low and high extremes of body sizes, pregnant women, and vegetarians.  GFR, Estimated  Date Value Ref Range Status  09/08/2021 >60 >60 mL/min Final    Comment:    (NOTE) Calculated using the CKD-EPI Creatinine Equation (2021)    eGFR  Date Value Ref Range Status  10/21/2021 59 (L) >59 mL/min/1.73 Final          Passed - HBA1C is between 0 and 7.9 and within 180 days    Hemoglobin A1C  Date Value Ref Range Status  09/06/2013 6.7 (H) 4.2 - 6.3 % Final    Comment:    The American Diabetes Association recommends that a primary goal of therapy should be <7% and that physicians should reevaluate the treatment regimen in patients with HbA1c values consistently >8%.    Hgb A1c MFr Bld  Date Value Ref Range Status  10/21/2021 6.9 (H) 4.8 - 5.6 % Final    Comment:             Prediabetes: 5.7 - 6.4          Diabetes: >6.4          Glycemic control for adults with diabetes: <7.0           Passed - Valid encounter within last 6 months    Recent Outpatient Visits           1 month ago Diabetic peripheral neuropathy [facility])   [facility] Birdie Sons, MD   1 month ago No-show for appointment   [facility] Bird[facility], MD   5 months ago Type 2 diabetes mellitus with stage 2 chronic kidney disease, without long-term current use of insulin [facility])   [facility] Birdie Sons, MD   8 months ago Type 2 diabetes mellitus with stage 2 chronic kidney  disease, without long-term current use of insulin [facility])   [place], [place], [place]   9 months ago Sinus congestion   [facility] [place], Wendee Bea[place], [place]       Future Appointments             In 2 months Fisher, Kirstie Peri, MD [facility]             methocarbamol (ROBAXIN) 500 MG tablet 60 tablet 5    Sig: Take 1 tablet (500 mg total) by mouth 2 (two) times daily as needed for muscle spasms.     Not Delegated - Analgesics:  Muscle Relaxants Failed - 12/04/2021 11:47 AM      Failed - This refill cannot be delegated      Passed - Valid encounter within last 6 months    Recent Outpatient Visits           1 month ago Diabetic peripheral neuropathy [facility])   [facility] Birdie Sons, MD   1 month ago No-show for appointment   [facility] Birdie Sons, MD   5 months ago Type 2 diabetes mellitus with stage 2 chronic kidney disease, without long-term current use of insulin [facility])   [facility] Birdie Sons, MD   8 months ago Type 2 diabetes mellitus with stage 2 chronic kidney disease, without long-term current use of insulin [facility])   [place], [place]   9 months ago Sinus congestion   [facility] Trinna [place], [place]       Future Appointments             In 2  months Birdie Sons, MD [facility]             Vitamin D, Ergocalciferol, (DRISDOL) 1.25 MG (50000 UNIT) CAPS capsule 12 capsule 4    Sig: Take 1 capsule (50,000 Units total) by mouth every 7 (seven) days.     Endocrinology:  Vitamins - Vitamin D Supplementation Failed - 12/04/2021 11:47 AM      Failed - 50,000 IU strengths are not delegated      Failed - Phosphate in normal range and within 360 days    No results found for: PHOS        Failed - Vitamin D in normal range and within 360 days    Vit D, 25-Hydroxy   Date Value Ref Range Status  08/12/2021 26.76 (L) 30 - 100 ng/mL Final    Comment:    (NOTE) Vitamin D deficiency has been defined by the Institute of Medicine  and an Endocrine Society practice guideline as a level of serum 25-OH  vitamin D less than 20 ng/mL (1,2). The Endocrine Society went on to  further define vitamin D insufficiency as a level between 21 and 29  ng/mL (2).  1. IOM (Institute of Medicine). 2010. Dietary reference intakes for  calcium and D. [place]: The [employer]. 2. Holick MF, Binkley [place], Bischoff-Ferrari HA, et al. Evaluation,  treatment, and prevention of vitamin D deficiency: an Endocrine  Society clinical practice guideline, JCEM. 2011 Jul; 96(7): 1911-30.  Performed at [facility], [place] [address] [place], [place] [address]           Passed - Ca in normal range and within 360 days    Calcium  Date Value Ref Range Status  10/21/2021 9.5 8.7 - 10.3 mg/dL Final   Calcium, Total  Date Value Ref Range Status  09/06/2013 9.2 8.5 - 10.1 mg/dL Final   Calcium, Ion  Date Value Ref Range Status  08/05/2021 1.14 (L) 1.15 - 1.40 mmol/L Final          Passed - Valid encounter within last 12 months    Recent Outpatient Visits           1 month ago Diabetic peripheral neuropathy [facility])   [facility] Birdie Sons, MD   1 month ago No-show for appointment   [facility] Birdie Sons, MD   5 months ago Type 2 diabetes mellitus with stage 2 chronic kidney disease, without long-term current use of insulin [facility])   [facility] Birdie Sons, MD   8 months ago Type 2 diabetes mellitus with stage 2 chronic kidney disease, without long-term current use of insulin [facility])   [place], [place], [place]   9 months ago Sinus congestion   [facility] [place], Fabio Bering [place], [place]       Future Appointments             In 2 months Fisher, Kirstie Peri,  [facility]             pyridOXINE (B-6) 50 MG tablet 30 tablet 0    Sig: Take 1 tablet (50 mg total) by mouth daily.     Off-Protocol Failed - 12/04/2021 11:47 AM      Failed - Medication not assigned to a protocol, review manually.      Passed - Valid encounter within last 12 months    Recent Outpatient Visits  1 month ago Diabetic peripheral neuropathy [facility])   [facility] Birdie Sons, MD   1 month ago No-show for appointment   [facility] Birdie Sons, MD   5 months ago Type 2 diabetes mellitus with stage 2 chronic kidney disease, without long-term current use of insulin [facility])   [facility] Birdie Sons, MD   8 months ago Type 2 diabetes mellitus with stage 2 chronic kidney disease, without long-term current use of insulin [facility])   [place], [place], [place]   9 months ago Sinus congestion   [facility] [place], Wendee Bea[place], [place]       Future Appointments             In 2 months Fisher, Kirstie Peri, MD [facility]             docusate sodium (COLACE) 50 MG capsule 10 capsule 0    Sig: Take 1 capsule (50 mg total) by mouth 2 (two) times daily.     Over the Counter:  OTC Passed - 12/04/2021 11:47 AM      Passed - Valid encounter within last 12 months    Recent Outpatient Visits           1 month ago Diabetic peripheral neuropathy [facility])   [facility] Birdie Sons, MD   1 month ago No-show for appointment   [facility] Bird[facility] Sons, MD   5 months ago Type 2 diabetes mellitus with stage 2 chronic kidney disease, without long-term current use of insulin ([place])   [facility] Birdie Sons, MD   8 months ago Type 2 diabetes mellitus with stage 2 chronic kidney disease, without long-term current use of insulin [facility])   [facility] [place], [place],  [place]   9 months ago Sinus congestion   [facility] [place], Wendee Beavers, [place]       Future Appointments             In 2 months Fisher, Kirstie Peri, MD [facility]             glyBURIDE (DIABETA) 5 MG tablet 30 tablet 0    Sig: Take 1 tablet (5 mg total) by mouth daily with breakfast.     Endocrinology:  Diabetes - Sulfonylureas Passed - 12/04/2021 11:47 AM      Passed - HBA1C is between 0 and 7.9 and within 180 days    Hemoglobin A1C  Date Value Ref Range Status  09/06/2013 6.7 (H) 4.2 - 6.3 % Final    Comment:    The American Diabetes Association recommends that a primary goal of therapy should be <7% and that physicians should reevaluate the treatment regimen in patients with HbA1c values consistently >8%.    Hgb A1c MFr Bld  Date Value Ref Range Status  10/21/2021 6.9 (H) 4.8 - 5.6 % Final    Comment:             Prediabetes: 5.7 - 6.4          Diabetes: >6.4          Glycemic control for adults with diabetes: <7.0           Passed - Valid encounter within last 6 months    Recent Outpatient Visits           1 month ago Diabetic peripheral neuropathy ([place])   [facility] Fisher, Kirstie Peri, MD   1 month ago No-show for appointment   [facility] Birdie Sons, MD   5 months ago Type 2 diabetes mellitus with stage 2 chronic kidney disease, without long-term current use of insulin [facility])   [facility] Birdie Sons, MD   8 months ago Type 2 diabetes mellitus with stage 2 chronic kidney disease, without long-term current use of insulin [facility])   [place], [place], [place]   9 months ago Sinus congestion   [facility] [place], Wendee [place], [place]       Future Appointments             In 2 months Fisher, Kirstie Peri, MD [facility]             pantoprazole (PROTONIX) 40 MG tablet 30 tablet 0    Sig: Take 1 tablet (40 mg  total) by mouth at bedtime.     Gastroenterology: Proton Pump Inhibitors Passed - 12/04/2021 11:47 AM      Passed - Valid encounter within last 12 months    Recent Outpatient Visits           1 month ago Diabetic peripheral neuropathy [facility])   [facility] Birdie Sons, MD   1 month ago No-show for appointment   [facility] Birdie Sons, MD   5 months ago Type 2 diabetes mellitus with stage 2 chronic kidney disease, without long-term current use of insulin [facility])   [facility] Birdie Sons, MD   8 months ago Type 2 diabetes mellitus with stage 2 chronic kidney disease, without long-term current use of insulin [facility])   [place], [place], [place]   9 months ago Sinus congestion   [facility] [place], Fabio Bering M, [place]       Future Appointments             In 2 months Fisher, Kirstie Peri, MD [facility]             atorvastatin (LIPITOR) 40 MG tablet 30 tablet 0    Sig: Take 1 tablet (40 mg total) by mouth daily.     Cardiovascular:  Antilipid - Statins Failed - 12/04/2021 11:47 AM      Failed - Total Cholesterol in normal range and within 360 days    Cholesterol, Total  Date Value Ref Range Status  10/21/2021 206 (H) 100 - 199 mg/dL Final   Cholesterol  Date Value Ref Range Status  09/06/2013 205 (H) 0 - 200 mg/dL Final          Failed - LDL in normal range and within 360 days    Ldl Cholesterol, Calc  Date Value Ref Range Status  09/06/2013 117 (H) 0 - 100 mg/dL Final   LDL Chol Calc (NIH)  Date Value Ref Range Status  10/21/2021 133 (H) 0 - 99 mg/dL Final          Passed - HDL in normal range and within 360 days    HDL Cholesterol  Date Value Ref Range Status  09/06/2013 53 40 - 60 mg/dL Final   HDL  Date Value Ref Range Status  10/21/2021 46 >39 mg/dL Final          Passed - Triglycerides in normal range and within 360 days     Triglycerides  Date Value Ref Range Status  10/21/2021 149 0 -  149 mg/dL Final  09/06/2013 174 0 - 200 mg/dL Final          Passed - Patient is not pregnant      Passed - Valid encounter within last 12 months    Recent Outpatient Visits           1 month ago Diabetic peripheral neuropathy [facility])   [facility] Birdie Sons, MD   1 month ago No-show for appointment   [facility] Birdie Sons, MD   5 months ago Type 2 diabetes mellitus with stage 2 chronic kidney disease, without long-term current use of insulin [facility])   [facility] Birdie Sons, MD   8 months ago Type 2 diabetes mellitus with stage 2 chronic kidney disease, without long-term current use of insulin [facility])   [place], [place], [place]   9 months ago Sinus congestion   [facility] [place], Fabio Bering M, [place]       Future Appointments             In 2 months Fisher, Kirstie Peri, MD [facility]             furosemide (LASIX) 40 MG tablet 30 tablet 0    Sig: Take 1 tablet (40 mg total) by mouth daily.     Cardiovascular:  Diuretics - Loop Failed - 12/04/2021 11:47 AM      Failed - Cr in normal range and within 360 days    Creatinine  Date Value Ref Range Status  09/06/2013 0.80 0.60 - 1.30 mg/dL Final   Creatinine, Ser  Date Value Ref Range Status  10/21/2021 1.04 (H) 0.57 - 1.00 mg/dL Final          Passed - K in normal range and within 360 days    Potassium  Date Value Ref Range Status  10/21/2021 4.2 3.5 - 5.2 mmol/L Final  09/06/2013 4.4 3.5 - 5.1 mmol/L Final          Passed - Ca in normal range and within 360 days    Calcium  Date Value Ref Range Status  10/21/2021 9.5 8.7 - 10.3 mg/dL Final   Calcium, Total  Date Value Ref Range Status  09/06/2013 9.2 8.5 - 10.1 mg/dL Final   Calcium, Ion  Date Value Ref Range Status  08/05/2021 1.14 (L) 1.15 - 1.40 mmol/L Final           Passed - Na in normal range and within 360 days    Sodium  Date Value Ref Range Status  10/21/2021 134 134 - 144 mmol/L Final  09/06/2013 135 (L) 136 - 145 mmol/L Final          Passed - Last BP in normal range    BP Readings from Last 1 Encounters:  11/05/21 122/74          Passed - Valid encounter within last 6 months    Recent Outpatient Visits           1 month ago Diabetic peripheral neuropathy [facility])   [facility] Birdie Sons, MD   1 month ago No-show for appointment   [facility] Birdie Sons, MD   5 months ago Type 2 diabetes mellitus with stage 2 chronic kidney disease, without long-term current use of insulin [facility])   [facility] Birdie Sons, MD   8 months ago Type 2 diabetes mellitus with stage 2 chronic  kidney disease, without long-term current use of insulin [facility])   [place], [place]   9 months ago Sinus congestion   [facility] [place], [place]       Future Appointments             In 2 months Fisher, Kirstie Peri, MD [facility], [place]

## 2021-12-04 NOTE — Telephone Encounter (Signed)
Medication Refill - Medication: Methocarbamol,gabapentin 300mg  & 100mg , alprazolam, guaifenesin, Vitamin D, losartan, ropinirole, aspirin, magnesium glycinate, amlodipine, atorvastatin, metformin, pantoprazole, docusate sodium, lidocaine, Pyridoxine, furosemide, glyburide, potassium chloride, acetaminophen, Multi Vitamin   Has the patient contacted their pharmacy? Yes.   Pt called stating that she contacted the pharmacy and is not able to afford continuing to get these medications in 30 days supplies until her new insurance kicks in. She is requesting to have all of these medications sent in as 90 days supply. Pt also states that the medications that were not prescribed by PCP were given to her in the hospital. Please advise.  (Agent: If no, request that the patient contact the pharmacy for the refill. If patient does not wish to contact the pharmacy document the reason why and proceed with request.) (Agent: If yes, when and what did the pharmacy advise?)  Preferred Pharmacy (with phone number or street name):  [place]  [place] [address]  Phone: [phone] Fax: [phone]  Hours: M-F 7:30a-5:30p   Has the patient been seen for an appointment in the last year OR does the patient have an upcoming appointment? Yes.    Agent: Please be advised that RX refills may take up to 3 business days. We ask that you follow-up with your pharmacy.

## 2021-12-05 ENCOUNTER — Other Ambulatory Visit: Payer: Self-pay

## 2021-12-05 MED ORDER — VITAMIN D (ERGOCALCIFEROL) 1.25 MG (50000 UNIT) PO CAPS
50000.0000 [IU] | ORAL_CAPSULE | ORAL | 4 refills | Status: DC
  Filled 2021-12-05: qty 12, 84d supply, fill #0

## 2021-12-05 MED ORDER — ATORVASTATIN CALCIUM 40 MG PO TABS
40.0000 mg | ORAL_TABLET | Freq: Every day | ORAL | 4 refills | Status: DC
  Filled 2021-12-05: qty 90, 90d supply, fill #0

## 2021-12-05 MED ORDER — LOSARTAN POTASSIUM 25 MG PO TABS
75.0000 mg | ORAL_TABLET | Freq: Every day | ORAL | 4 refills | Status: DC
  Filled 2021-12-05: qty 90, 30d supply, fill #0

## 2021-12-05 MED ORDER — GABAPENTIN 300 MG PO CAPS
300.0000 mg | ORAL_CAPSULE | Freq: Every day | ORAL | 4 refills | Status: DC
  Filled 2021-12-05: qty 180, 90d supply, fill #0
  Filled 2022-02-27: qty 180, 90d supply, fill #1

## 2021-12-05 MED ORDER — METHOCARBAMOL 500 MG PO TABS
500.0000 mg | ORAL_TABLET | Freq: Two times a day (BID) | ORAL | 1 refills | Status: DC | PRN
  Filled 2021-12-05: qty 180, 90d supply, fill #0

## 2021-12-05 MED ORDER — ROPINIROLE HCL 0.5 MG PO TABS
0.5000 mg | ORAL_TABLET | Freq: Every evening | ORAL | 1 refills | Status: DC | PRN
  Filled 2021-12-05: qty 90, 90d supply, fill #0

## 2021-12-05 MED ORDER — GABAPENTIN 100 MG PO CAPS
100.0000 mg | ORAL_CAPSULE | Freq: Two times a day (BID) | ORAL | 4 refills | Status: DC
  Filled 2021-12-05: qty 180, 90d supply, fill #0

## 2021-12-05 MED ORDER — AMLODIPINE BESYLATE 10 MG PO TABS
10.0000 mg | ORAL_TABLET | Freq: Every day | ORAL | 4 refills | Status: DC
  Filled 2021-12-05: qty 90, 90d supply, fill #0

## 2021-12-05 MED ORDER — ALPRAZOLAM 0.5 MG PO TABS
ORAL_TABLET | ORAL | 2 refills | Status: DC
  Filled 2021-12-05 – 2022-02-18 (×2): qty 90, 45d supply, fill #0
  Filled 2022-03-30 – 2022-04-02 (×2): qty 90, 45d supply, fill #1

## 2021-12-05 MED ORDER — FUROSEMIDE 40 MG PO TABS
40.0000 mg | ORAL_TABLET | Freq: Every day | ORAL | 1 refills | Status: DC
  Filled 2021-12-05: qty 30, 30d supply, fill #0

## 2021-12-05 MED ORDER — METFORMIN HCL 500 MG PO TABS
500.0000 mg | ORAL_TABLET | Freq: Two times a day (BID) | ORAL | 4 refills | Status: DC
  Filled 2021-12-05: qty 180, 90d supply, fill #0
  Filled 2022-07-01: qty 180, 90d supply, fill #1

## 2021-12-05 MED ORDER — POTASSIUM CHLORIDE CRYS ER 20 MEQ PO TBCR
20.0000 meq | EXTENDED_RELEASE_TABLET | Freq: Every day | ORAL | 1 refills | Status: DC
  Filled 2021-12-05: qty 30, 30d supply, fill #0

## 2021-12-05 MED ORDER — GLYBURIDE 5 MG PO TABS
5.0000 mg | ORAL_TABLET | Freq: Every day | ORAL | 1 refills | Status: DC
  Filled 2021-12-05: qty 90, 90d supply, fill #0
  Filled 2022-07-01: qty 90, 90d supply, fill #1

## 2021-12-11 ENCOUNTER — Other Ambulatory Visit: Payer: Self-pay

## 2021-12-11 ENCOUNTER — Ambulatory Visit
Admission: RE | Admit: 2021-12-11 | Discharge: 2021-12-11 | Disposition: A | Discharge status: Home / Self Care | Payer: 59 | Source: Ambulatory Visit | Attending: Family Medicine | Admitting: Family Medicine

## 2021-12-11 DIAGNOSIS — Z1231 Encounter for screening mammogram for malignant neoplasm of breast: Secondary | ICD-10-CM | POA: Insufficient documentation

## 2021-12-12 NOTE — Telephone Encounter (Signed)
error 

## 2021-12-17 NOTE — Progress Notes (Addendum)
Subjective:    Patient ID: [name], female    DOB: Mar 14, 1956, 66 y.o.   MRN: [id number]  HPI An audio/video tele-health visit is felt to be the most appropriate encounter for this patient at this time.  This is a follow up tele-visit via phone. The patient is at home. MD is at office.    [name] is a 66 year old woman who presents for follow-up s/p CABG.  She has been doing well at home but has not yet received and PT, OT, or nursing services. She says that SW is aware and working on this.   She has been doing her dressing changes and inspecting her wound daily  She does not need any refills of her medications.  BP is currently well controlled  Has not been walking as much as when she was in the hospital.  She has struggled a lot with her disability insurance and this has impacted her financially. She shares some of these concerns with me today    Pain Inventory Average Pain 0 Pain Right Now 0 My pain is  no pain  In the last 24 hours, has pain interfered with the following? General activity 0 Relation with others 0 Enjoyment of life 0 What TIME of day is your pain at its worst? no pain Sleep (in general) Good  Pain is worse with: no pain Pain improves with: no pain Relief from Meds: no pain  walk with assistance use a walker  disabled: date disabled short term on leave from [place];  weakness trouble walking anxiety  Any changes since last visit?  no  Any changes since last visit?  no    Family History  Problem Relation Age of Onset   Bone cancer Brother    Aneurysm Mother        brain   Diabetes Father    CAD Father    Heart failure Father    Colon cancer Father    Cancer Father    Alzheimer's disease Paternal Grandmother    Dementia Paternal Grandmother    Mental illness Paternal Grandfather    Healthy Daughter    Healthy Son    Breast cancer Neg Hx    Social History   Socioeconomic History   Marital status: Widowed     Spouse name: Not on file   Number of children: 2   Years of education: Not on file   Highest education level: 12th grade  Occupational History    Employer: [employer]  Tobacco Use   Smoking status: Former    Packs/day: 1.50    Years: 25.00    Pack years: 37.50    Types: Cigarettes    Quit date: 03/24/2007    Years since quitting: 14.7   Smokeless tobacco: Never  Vaping Use   Vaping Use: Never used  Substance and Sexual Activity   Alcohol use: Not Currently   Drug use: No   Sexual activity: Not Currently    Birth control/protection: Post-menopausal  Other Topics Concern   Not on file  Social History Narrative   Not on file   Social Determinants of Health   Financial Resource Strain: Not on file  Food Insecurity: Not on file  Transportation Needs: Not on file  Physical Activity: Not on file  Stress: Not on file  Social Connections: Not on file   Past Surgical History:  Procedure Laterality Date   AORTIC VALVE REPLACEMENT N/A 08/04/2021   Procedure: RESECTION AORTIC VALVE  TUMOR;  Surgeon: [name], MD;  Location: [place];  Service: Open Heart Surgery;  Laterality: N/A;   APPLICATION OF WOUND VAC N/A 08/24/2021   Procedure: APPLICATION OF WOUND VAC;  Surgeon: Gaye Pollack, MD;  Location: [place] [facility];  Service: Thoracic;  Laterality: N/A;   APPLICATION OF WOUND VAC N/A 08/28/2021   Procedure: WOUND VAC CHANGE;  Surgeon: [name], MD;  Location: [place];  Service: Thoracic;  Laterality: N/A;   BREAST EXCISIONAL BIOPSY Right 1999   neg   BUBBLE STUDY  04/24/2021   Procedure: BUBBLE STUDY;  Surgeon: Sueanne Margarita, MD;  Location: [place];  Service: Cardiovascular;;   CATARACT EXTRACTION  12/2011   CATARACT EXTRACTION W/PHACO Left 12/02/2016   Procedure: CATARACT EXTRACTION PHACO AND INTRAOCULAR LENS PLACEMENT ([place]);  Surgeon: Estill Cotta, MD;  Location: [facility];  Service: Ophthalmology;  Laterality: Left;  [place] 2:14 AP% 28.4 CDE  59.73 Fluid pack lot # [id number]   CORONARY ARTERY BYPASS GRAFT N/A 08/04/2021   Procedure: CORONARY ARTERY BYPASS GRAFTING (CABG) X  4  USING LEFT INTERNAL MAMMARY ARTERY AND RIGHT GREATER SAPHENOUS VEIN ENDOSCOPIC CONDUITS;  Surgeon: [name], MD;  Location: [place];  Service: Open Heart Surgery;  Laterality: N/A;   DIAGNOSTIC LAPAROSCOPY     EYE SURGERY     IR CT HEAD LTD  04/19/2021   IR PERCUTANEOUS ART THROMBECTOMY/INFUSION INTRACRANIAL INC DIAG ANGIO  04/19/2021       IR PERCUTANEOUS ART THROMBECTOMY/INFUSION INTRACRANIAL INC DIAG ANGIO  04/19/2021   IR US GUIDE VASC ACCESS LEFT  04/19/2021   RADIOLOGY WITH ANESTHESIA N/A 04/19/2021   Procedure: IR WITH ANESTHESIA;  Surgeon: Luanne Bras, MD;  Location: [place];  Service: Radiology;  Laterality: N/A;   STERNAL WOUND DEBRIDEMENT N/A 08/24/2021   Procedure: STERNAL WOUND DRAINAGE AND DEBRIDEMENT;  Surgeon: Gaye Pollack, MD;  Location: [place] [facility];  Service: Thoracic;  Laterality: N/A;   STERNAL WOUND DEBRIDEMENT N/A 08/28/2021   Procedure: STERNAL WOUND DEBRIDEMENT;  Surgeon: [name], MD;  Location: [place];  Service: Thoracic;  Laterality: N/A;   TEE WITHOUT CARDIOVERSION N/A 04/24/2021   Procedure: TRANSESOPHAGEAL ECHOCARDIOGRAM (TEE);  Surgeon: Sueanne Margarita, MD;  [facility];  Service: Cardiovascular;  Laterality: N/A;   TEE WITHOUT CARDIOVERSION N/A 08/04/2021   Procedure: TRANSESOPHAGEAL ECHOCARDIOGRAM (TEE);  Surgeon: [name], MD;  Location: [place];  Service: Open Heart Surgery;  Laterality: N/A;   TOTAL HIP ARTHROPLASTY Right 04/04/2018   Procedure: TOTAL HIP ARTHROPLASTY;  Surgeon: Dereck Leep, MD;  Location: [facility];  Service: Orthopedics;  Laterality: Right;   Past Medical History:  Diagnosis Date   Anxiety    Arthritis    CAD (coronary artery disease)    a. 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 s/p CABG x4 LIMA to LAD, SVG to PDA, and Sequential SVG to OM and Diagonal on 8/   Stroke [facility]) 04/2021   admitted at [facility] - left sided weakness   Urinary incontinence    as stated by patient   There were no vitals taken for this visit.  Opioid Risk Score:   Fall Risk Score:  `1  Depression screen PHQ 2/9  Depression screen [facility] 2/9 09/26/2021 03/20/2021 09/20/2020 08/24/2019 03/24/2018 07/15/2017 10/26/2016  Decreased Interest 1 1 0 1 1 1 3   Down, Depressed, Hopeless 1 1 1  0 1 1 3   PHQ - 2 Score 2 2 1 1 2 2 6   Altered sleeping 0 0 1 1 0 0 1  Tired, decreased energy 2 1 1 1 1 1  0  Change in appetite 3 1 0 0 0 1 0  Feeling bad or failure about yourself  1 0 0 0 1 0 0  Trouble concentrating 0 0 0 0 0 0 0  Moving slowly or fidgety/restless 0 1 0 0 0 0 0  Suicidal thoughts 0 0 1 0 0 0 0  PHQ-9 Score 8 5 4 3 4 4 7   Difficult doing work/chores - Not difficult at all Not difficult at all Not difficult at all Somewhat difficult Not difficult at all Not difficult at all  Some recent data might be hidden     Review of Systems  Constitutional:  Positive for unexpected weight change.       Wt loss  HENT: Negative.    Eyes: Negative.   Respiratory: Negative.    Cardiovascular: Negative.   Gastrointestinal: Negative.   Endocrine: Negative.   Genitourinary: Negative.   Musculoskeletal:  Positive for gait problem.  Skin: Negative.   Allergic/Immunologic: Negative.   Neurological:  Positive for weakness.  Hematological: Negative.    Psychiatric/Behavioral:  Positive for dysphoric mood. The patient is nervous/anxious.   All other systems reviewed and are negative.     Objective:   Physical Exam Not performed as patient was seen via phone       Assessment & Plan:   1) s/p CABG -provided with handicap placard -messaged SW regarding getting her outpatient therapies -would examined- recommended continued daily wound inspection -she has struggled with getting disability payments and this has impacted her financially, discussed this with her today, provided a note that reflects all the conditions for which she was treated while she was she was inpatient rehab as she is having to argue to get coverage for inpatient rehab as well  2) Left sided neuropathic pain -Discussed benefits of exercise in reducing pain. Recommend topical CBD oil- discussed its benefits in reducing inflammation, pain, insomnia, and anxiety.  -Discussed that CBD oil differs from marijuana in that it does not contain THC- the substance that causes euphoria.  -Discussed that it is made from the hemp plant.  -It has been used for thousands of years -preliminary research suggests that if may be able to shrink cancerous tumors, stop plaque formation in Alzheimer's Disease, and slow the progress of brain disease from concussions.  -Additional benefits that have been demonstrated in studies include improved nausea, indigestion, and brain health, and reduced seizures.  -In a survey 92% of patients who tried medical cannabis felt it improved symptoms such as chronic pain, arthritis, migraines, and cancer.    -Discussed following foods that may reduce pain: 1) Ginger (especially studied for arthritis)- reduce leukotriene production to decrease inflammation 2) Blueberries- high in  phytonutrients that decrease inflammation 3) Salmon- marine omega-3s reduce joint swelling and pain 4) Pumpkin seeds- reduce inflammation 5) dark chocolate- reduces  inflammation 6) turmeric- reduces inflammation 7) tart cherries - reduce pain and stiffness 8) extra virgin olive oil - its compound olecanthal helps to block prostaglandins  9) chili peppers- can be eaten or applied topically via capsaicin 10) mint- helpful for headache, muscle aches, joint pain, and itching 11) garlic- reduces inflammation  Link to further information on diet for chronic pain: [email or web address]   3) HTN: -BP is 133/79 last visit -Advised checking BP daily at home and logging results to bring into follow-up appointment with her PCP and myself. -Reviewed BP meds today.  -Advised regarding healthy foods that can help lower blood pressure and provided with a list: 1) citrus foods- high in vitamins and minerals 2) salmon and other fatty fish - reduces inflammation and oxylipins 3) swiss chard (leafy green)- high level of nitrates 4) pumpkin seeds- one of the best natural sources of magnesium 5) Beans and lentils- high in fiber, magnesium, and potassium 6) Berries- high in flavonoids 7) Amaranth (whole grain, can be cooked similarly to rice and oats)- high in magnesium and fiber 8) Pistachios- even more effective at reducing BP than other nuts 9) Carrots- high in phenolic compounds that relax blood vessels and reduce inflammation 10) Celery- contain phthalides that relax tissues of arterial walls 11) Tomatoes- can also improve cholesterol and reduce risk of heart disease 12) Broccoli- good source of magnesium, calcium, and potassium 13) Greek yogurt: high in potassium and calcium 14) Herbs and spices: Celery seed, cilantro, saffron, lemongrass, black cumin, ginseng, cinnamon, cardamom, sweet basil, and ginger 15) Chia and flax seeds- also help to lower cholesterol and blood sugar 16) Beets- high levels of nitrates that relax blood vessels  17) spinach and bananas- high in potassium  -Provided lise  of supplements that can help with hypertension:  1) magnesium: one high quality brand is Bioptemizers since it contains all 7 types of magnesium, otherwise over the counter magnesium gluconate 400mg  is a good option 2) B vitamins 3) vitamin D 4) potassium 5) CoQ10 6) L-arginine 7) Vitamin C 8) Beetroot -Educated that goal BP is 120/80. -Made goal to incorporate some of the above foods into diet.     4) Diabetes: -check CBGs daily, log, and bring log to follow-up appointment -avoid sugar, bread, pasta, rice -avoid snacking -perform daily foot exam and at least annual eye exam -try to incorporate into your diet some of the following foods which are good for diabetes: 1) cinnamon- imitates effects of insulin, increasing glucose transport into cells ([place] or [place] cinnamon is best, least processed) 2) nuts- can slow down the blood sugar response of carbohydrate rich foods 3) oatmeal- contains and anti-inflammatory compound avenanthramide 4) whole-milk yogurt (best types are no sugar, [place] yogurt, or goat/sheep yogurt) 5) beans- high in protein, fiber, and vitamins, low glycemic index 6) broccoli- great source of vitamin A and C 7) quinoa- higher in protein and fiber than other grains 8) spinach- high in vitamin A, fiber, and protein 9) olive oil- reduces glucose levels, LDL, and triglycerides 10) salmon- excellent amount of omega-3-fatty acids 11) walnuts- rich in antioxidants 12) apples- high in fiber and quercetin 13) carrots- highly nutritious with low impact on blood sugar 14) eggs- improve HDL (good cholesterol), high in protein, keep you satiated 15) turmeric: improves blood sugars, cardiovascular disease, and protects kidney health 16) garlic: improves blood sugar, blood pressure, pain  17) tomatoes: highly nutritious with low impact on blood sugar   10 minutes spent in discussing her difficulties with getting disability payments, her receiving refusal of coverage for  inpatient rehab stay, offering to write a note explaining all the conditions for which she was treated in patient rehab, offering that she can give my cell phone number to any insurance providers she is working with in case I can be of help to her, additional time spent writing note for her

## 2021-12-18 ENCOUNTER — Encounter: Payer: Self-pay | Attending: Physical Medicine and Rehabilitation | Admitting: Physical Medicine and Rehabilitation

## 2021-12-18 ENCOUNTER — Other Ambulatory Visit: Payer: Self-pay

## 2021-12-18 DIAGNOSIS — Z951 Presence of aortocoronary bypass graft: Secondary | ICD-10-CM | POA: Insufficient documentation

## 2021-12-22 ENCOUNTER — Ambulatory Visit: Payer: 59 | Admitting: Family Medicine

## 2022-01-13 ENCOUNTER — Ambulatory Visit: Payer: 59 | Admitting: Internal Medicine

## 2022-01-15 ENCOUNTER — Encounter: Payer: Self-pay | Admitting: Physical Medicine and Rehabilitation

## 2022-02-18 ENCOUNTER — Other Ambulatory Visit (HOSPITAL_COMMUNITY): Payer: Self-pay

## 2022-02-24 ENCOUNTER — Other Ambulatory Visit (HOSPITAL_COMMUNITY): Payer: Self-pay

## 2022-02-24 DIAGNOSIS — H40153 Residual stage of open-angle glaucoma, bilateral: Secondary | ICD-10-CM | POA: Diagnosis not present

## 2022-02-27 ENCOUNTER — Other Ambulatory Visit (HOSPITAL_COMMUNITY): Payer: Self-pay

## 2022-03-02 ENCOUNTER — Other Ambulatory Visit (HOSPITAL_COMMUNITY): Payer: Self-pay

## 2022-03-03 ENCOUNTER — Ambulatory Visit: Payer: 59 | Admitting: Family Medicine

## 2022-03-06 ENCOUNTER — Other Ambulatory Visit (HOSPITAL_COMMUNITY): Payer: Self-pay

## 2022-03-09 ENCOUNTER — Other Ambulatory Visit: Payer: Self-pay

## 2022-03-09 ENCOUNTER — Ambulatory Visit (INDEPENDENT_AMBULATORY_CARE_PROVIDER_SITE_OTHER): Payer: PPO | Admitting: Family Medicine

## 2022-03-09 VITALS — BP 141/87 | HR 88 | Temp 98.3°F | Wt 164.0 lb

## 2022-03-09 DIAGNOSIS — E2839 Other primary ovarian failure: Secondary | ICD-10-CM | POA: Diagnosis not present

## 2022-03-09 DIAGNOSIS — F3342 Major depressive disorder, recurrent, in full remission: Secondary | ICD-10-CM

## 2022-03-09 DIAGNOSIS — Z1211 Encounter for screening for malignant neoplasm of colon: Secondary | ICD-10-CM | POA: Diagnosis not present

## 2022-03-09 DIAGNOSIS — E1142 Type 2 diabetes mellitus with diabetic polyneuropathy: Secondary | ICD-10-CM

## 2022-03-09 DIAGNOSIS — E559 Vitamin D deficiency, unspecified: Secondary | ICD-10-CM | POA: Diagnosis not present

## 2022-03-09 DIAGNOSIS — Z23 Encounter for immunization: Secondary | ICD-10-CM

## 2022-03-09 DIAGNOSIS — G2581 Restless legs syndrome: Secondary | ICD-10-CM | POA: Diagnosis not present

## 2022-03-09 DIAGNOSIS — Z800 Family history of malignant neoplasm of digestive organs: Secondary | ICD-10-CM | POA: Diagnosis not present

## 2022-03-09 MED ORDER — ASPIRIN 81 MG PO TBEC: 81.0000 mg | DELAYED_RELEASE_TABLET | Freq: Every day | ORAL | Status: DC

## 2022-03-09 NOTE — Patient Instructions (Signed)
Please review the attached list of medications and notify my office if there are any errors.  ? ?Start taking over the counter '81mg'$  aspirin once a day ?

## 2022-03-09 NOTE — Progress Notes (Signed)
?  ? ? ?Established patient visit ? ? ?Patient: [name]   DOB: 12-13-1956   66 y.o. Female  MRN: [id number] ?Visit Date: 03/09/2022 ? ?Today's healthcare provider: Lelon Huh, MD  ? ?Chief Complaint  ?Patient presents with  ? Diabetes  ? ?Subjective  ?  ?HPI  ?Diabetes Mellitus Type II, follow-up ? ?Lab Results  ?Component Value Date  ? HGBA1C 6.9 (H) 10/21/2021  ? HGBA1C 7.8 (H) 07/31/2021  ? HGBA1C 7.8 (H) 07/15/2021  ? ?Last seen for diabetes 4 months ago.  ?Management since then includes continuing the same treatment. ?She reports fair compliance with treatment.  Patient states she does not take it all the time because she can not read the bottles. ?She is not having side effects.  ? ?Home blood sugar records: not being checked ? ?Episodes of hypoglycemia? No  ?  ?Current insulin regiment: none ?Most Recent Eye Exam: 2 weeks ago ? ?--------------------------------------------------------------------------------------------------- ?Hypertension, follow-up ? ?BP Readings from Last 3 Encounters:  ?03/09/22 (!) 141/87  ?11/05/21 122/74  ?10/21/21 107/72  ? Wt Readings from Last 3 Encounters:  ?03/09/22 164 lb (74.4 kg)  ?10/21/21 171 lb (77.6 kg)  ?09/30/21 172 lb (78 kg)  ?  ? ?She was last seen for hypertension 4 months ago.  ?BP at that visit was 122/74. ?Management since that visit includes continuing same treatment. ?She reports fair compliance with treatment. ?She is not having side effects.  ?She is not exercising. ?She is not adherent to low salt diet.   ?Outside blood pressures are not checked. ? ?She does not smoke. ? ?Use of agents associated with hypertension: none.  ? ?--------------------------------------------------------------------------------------------------- ?Lipid/Cholesterol, follow-up ? ?Last Lipid Panel: ?Lab Results  ?Component Value Date  ? CHOL 206 (H) 10/21/2021  ? LDLCALC 133 (H) 10/21/2021  ? HDL 46 10/21/2021  ? TRIG 149 10/21/2021  ? ? ?She was last seen for this 4 months  ago.  ?Management since that visit includes continuing same treatment. ? ?She reports good compliance with treatment. ?She is not having side effects.  ? ?Symptoms: ?No appetite changes No foot ulcerations  ?No chest pain No chest pressure/discomfort  ?No dyspnea No orthopnea  ?No fatigue No lower extremity edema  ?No palpitations No paroxysmal nocturnal dyspnea  ?No nausea Yes numbness or tingling of extremity  ?No polydipsia No polyuria  ?No speech difficulty No syncope  ? ?Patient still has some numbness in the left hand from her stroke ? ? ?Last metabolic panel ?Lab Results  ?Component Value Date  ? GLUCOSE 169 (H) 10/21/2021  ? NA 134 10/21/2021  ? K 4.2 10/21/2021  ? BUN 9 10/21/2021  ? CREATININE 1.04 (H) 10/21/2021  ? EGFR 59 (L) 10/21/2021  ? GFRNONAA >60 09/08/2021  ? CALCIUM 9.5 10/21/2021  ? AST 27 10/21/2021  ? ALT 9 10/21/2021  ? ?The ASCVD Risk score (Arnett DK, et al., 2019) failed to calculate for the following reasons: ?  The patient has a prior MI or stroke diagnosis ? ?---------------------------------------------------------------------------------------------------  ? ?Medications: ?Outpatient Medications Prior to Visit  ?Medication Sig Note  ? acetaminophen (TYLENOL) 325 MG tablet Take 2 tablets (650 mg total) by mouth every 4 (four) hours as needed for mild pain (or temp > 37.5 C (99.5 F)).   ? albuterol (VENTOLIN HFA) 108 (90 Base) MCG/ACT inhaler Inhale 2 puffs into the lungs every 4 (four) hours as needed for wheezing or shortness of breath.   ? ALPRAZolam (XANAX) 0.5 MG tablet TAKE 1 TABLET  BY MOUTH 2 TIMES DAILY AS NEEDED FOR ANXIETY.   ? amLODipine (NORVASC) 10 MG tablet Take 1 tablet (10 mg total) by mouth daily.   ? atorvastatin (LIPITOR) 40 MG tablet Take 1 tablet (40 mg total) by mouth daily.   ? Blood Glucose Monitoring Suppl (FREESTYLE FREEDOM LITE) w/Device KIT USE AS DIRECTED   ? Blood Pressure Monitoring (OMRON 3 SERIES BP MONITOR) DEVI Use as directed to monitor blood pressure    ? docusate sodium (COLACE) 50 MG capsule Take 1 capsule (50 mg total) by mouth 2 (two) times daily.   ? gabapentin (NEURONTIN) 300 MG capsule Take 1-2 capsules (300-600 mg total) by mouth at bedtime.   ? glucose blood (FREESTYLE LITE) test strip Also needs Lancets. Use to check blood sugar up to four times a day for insulin dependant diabetes   ? glyBURIDE (DIABETA) 5 MG tablet Take 1 tablet (5 mg total) by mouth daily with breakfast.   ? guaiFENesin (MUCINEX) 600 MG 12 hr tablet Take 1 tablet (600 mg total) by mouth 2 (two) times daily.   ? lidocaine (LIDODERM) 5 % Place 1 patch onto the skin daily. Remove & Discard patch within 12 hours or as directed by MD   ? losartan (COZAAR) 25 MG tablet Take 3 tablets (75 mg total) by mouth daily.   ? Magnesium Glycinate 100 MG CAPS Take 1 tablet by mouth at bedtime.   ? metFORMIN (GLUCOPHAGE) 500 MG tablet Take 1 tablet (500 mg total) by mouth 2 (two) times daily with a meal.   ? methocarbamol (ROBAXIN) 500 MG tablet Take 1 tablet (500 mg total) by mouth 2 (two) times daily as needed for muscle spasms.   ? Multiple Vitamin (MULTIVITAMIN) tablet Take 1 tablet by mouth daily.   ? sertraline (ZOLOFT) 100 MG tablet Take 1 tablet (100 mg total) by mouth daily.   ? Vitamin D, Ergocalciferol, (DRISDOL) 1.25 MG (50000 UNIT) CAPS capsule Take 1 capsule (50,000 Units total) by mouth every 7 (seven) days.   ? ?No facility-administered medications prior to visit.  ? ? ?Review of Systems ? ? ?  Objective  ?  ?BP (!) 141/87 (BP Location: Right Arm, Patient Position: Sitting, Cuff Size: Normal)   Pulse 88   Temp 98.3 ?F (36.8 ?C) (Oral)   Wt 164 lb (74.4 kg)   SpO2 96%   BMI 25.69 kg/m?  ? ? ?Physical Exam  ? ?General: Appearance:     ?Well developed, well nourished female in no acute distress  ?Eyes:    PERRL, conjunctiva/corneas clear, EOM's intact       ?Lungs:     Clear to auscultation bilaterally, respirations unlabored  ?Heart:    Normal heart rate. Normal rhythm. No murmurs,  rubs, or gallops.    ?MS:   All extremities are intact.    ?Neurologic:   Awake, alert, oriented x 3. No apparent focal neurological defect.   ?   ?  ? Assessment & Plan  ?  ? ?1. Diabetic peripheral neuropathy ([place]) ? ?- CBC ?- Hemoglobin A1c ?- Comprehensive metabolic panel ?- Magnesium ? ?2. RLS ?Continue dose of gabapentin ? ?3. Recurrent major depressive disorder, in full remission ([place]) ?She is tolerating atorvastatin well with no adverse effects.   ? ?4. Estrogen deficiency ? ?- DG Bone density [place]; Future ? ?5. Colon cancer screening ? ?- Ambulatory referral to gastroenterology for colonoscopy ? ? ?7. Family history of colon cancer ? ?- Ambulatory referral to gastroenterology for  colonoscopy ? ?7. Restless leg syndrome ? ?- Ferritin ? ?8. Avitaminosis D ? ?- VITAMIN D 25 Hydroxy (Vit-D Deficiency, Fractures) ? ?  ?   ? ?The entirety of the information documented in the History of Present Illness, Review of Systems and Physical Exam were personally obtained by me. Portions of this information were initially documented by the CMA and reviewed by me for thoroughness and accuracy.   ? ? ?Lelon Huh, MD  ?[facility] ?[phone] (phone) ?[phone] (fax) ? ?[facility]  ?

## 2022-03-10 ENCOUNTER — Other Ambulatory Visit: Payer: Self-pay | Admitting: Family Medicine

## 2022-03-10 DIAGNOSIS — E1142 Type 2 diabetes mellitus with diabetic polyneuropathy: Secondary | ICD-10-CM | POA: Diagnosis not present

## 2022-03-10 DIAGNOSIS — E559 Vitamin D deficiency, unspecified: Secondary | ICD-10-CM | POA: Diagnosis not present

## 2022-03-10 DIAGNOSIS — G2581 Restless legs syndrome: Secondary | ICD-10-CM | POA: Diagnosis not present

## 2022-03-11 ENCOUNTER — Other Ambulatory Visit (HOSPITAL_COMMUNITY): Payer: Self-pay

## 2022-03-11 ENCOUNTER — Other Ambulatory Visit: Payer: Self-pay

## 2022-03-11 DIAGNOSIS — Z86010 Personal history of colonic polyps: Secondary | ICD-10-CM

## 2022-03-11 MED ORDER — NA SULFATE-K SULFATE-MG SULF 17.5-3.13-1.6 GM/177ML PO SOLN
1.0000 | Freq: Once | ORAL | 0 refills | Status: AC
  Filled 2022-03-11: qty 354, 1d supply, fill #0
  Filled 2022-03-11: qty 354, 2d supply, fill #0
  Filled 2022-03-11: qty 354, 1d supply, fill #0

## 2022-03-11 MED ORDER — ONDANSETRON HCL 4 MG PO TABS
4.0000 mg | ORAL_TABLET | Freq: Three times a day (TID) | ORAL | 1 refills | Status: DC | PRN
  Filled 2022-03-11: qty 30, 10d supply, fill #0

## 2022-03-11 NOTE — Progress Notes (Signed)
Gastroenterology Pre-Procedure Review ? ?Request Date: 04/13/2022 ?Requesting Physician: Dr. Marius Ditch ? ?PATIENT REVIEW QUESTIONS: The patient responded to the following health history questions as indicated:   ? ?1. Are you having any GI issues? no ?2. Do you have a personal history of Polyps? yes (LAST COLONOSCOPY ) ?3. Do you have a family history of Colon Cancer or Polyps? yes (COLON CANCER) ?4. Diabetes Mellitus? yes (TYPE 2) ?5. Joint replacements in the past 12 months?no ?6. Major health problems in the past 3 months?no ?7. Any artificial heart valves, MVP, or defibrillator?no ?   ?MEDICATIONS & ALLERGIES:    ?Patient reports the following regarding taking any anticoagulation/antiplatelet therapy:   ?Plavix, Coumadin, Eliquis, Xarelto, Lovenox, Pradaxa, Brilinta, or Effient? no ?Aspirin? yes (81 MG) ? ?Patient confirms/reports the following medications:  ?Current Outpatient Medications  ?Medication Sig Dispense Refill  ? acetaminophen (TYLENOL) 325 MG tablet Take 2 tablets (650 mg total) by mouth every 4 (four) hours as needed for mild pain (or temp > 37.5 C (99.5 F)).    ? albuterol (VENTOLIN HFA) 108 (90 Base) MCG/ACT inhaler Inhale 2 puffs into the lungs every 4 (four) hours as needed for wheezing or shortness of breath. 18 g 0  ? ALPRAZolam (XANAX) 0.5 MG tablet TAKE 1 TABLET BY MOUTH 2 TIMES DAILY AS NEEDED FOR ANXIETY. 90 tablet 2  ? amLODipine (NORVASC) 10 MG tablet Take 1 tablet (10 mg total) by mouth daily. 90 tablet 4  ? aspirin 81 MG EC tablet Take 1 tablet (81 mg total) by mouth daily. Swallow whole.    ? atorvastatin (LIPITOR) 40 MG tablet Take 1 tablet (40 mg total) by mouth daily. 90 tablet 4  ? Blood Glucose Monitoring Suppl (FREESTYLE FREEDOM LITE) w/Device KIT USE AS DIRECTED 1 kit 0  ? Blood Pressure Monitoring (OMRON 3 SERIES BP MONITOR) DEVI Use as directed to monitor blood pressure 1 each 0  ? docusate sodium (COLACE) 50 MG capsule Take 1 capsule (50 mg total) by mouth 2 (two) times daily.  10 capsule 0  ? gabapentin (NEURONTIN) 300 MG capsule Take 1-2 capsules (300-600 mg total) by mouth at bedtime. 180 capsule 4  ? glucose blood (FREESTYLE LITE) test strip Also needs Lancets. Use to check blood sugar up to four times a day for insulin dependant diabetes 100 each 12  ? glyBURIDE (DIABETA) 5 MG tablet Take 1 tablet (5 mg total) by mouth daily with breakfast. 90 tablet 1  ? guaiFENesin (MUCINEX) 600 MG 12 hr tablet Take 1 tablet (600 mg total) by mouth 2 (two) times daily. 60 tablet 2  ? lidocaine (LIDODERM) 5 % Place 1 patch onto the skin daily. Remove & Discard patch within 12 hours or as directed by MD 30 patch 0  ? losartan (COZAAR) 25 MG tablet Take 3 tablets (75 mg total) by mouth daily. 90 tablet 4  ? Magnesium Glycinate 100 MG CAPS Take 1 tablet by mouth at bedtime. 30 capsule 3  ? metFORMIN (GLUCOPHAGE) 500 MG tablet Take 1 tablet (500 mg total) by mouth 2 (two) times daily with a meal. 180 tablet 4  ? methocarbamol (ROBAXIN) 500 MG tablet Take 1 tablet (500 mg total) by mouth 2 (two) times daily as needed for muscle spasms. 180 tablet 1  ? Multiple Vitamin (MULTIVITAMIN) tablet Take 1 tablet by mouth daily.    ? sertraline (ZOLOFT) 100 MG tablet Take 1 tablet (100 mg total) by mouth daily. 90 tablet 4  ? Vitamin D, Ergocalciferol, (DRISDOL)  1.25 MG (50000 UNIT) CAPS capsule Take 1 capsule (50,000 Units total) by mouth every 7 (seven) days. 12 capsule 4  ? ?No current facility-administered medications for this visit.  ? ? ?Patient confirms/reports the following allergies:  ?Allergies  ?Allergen Reactions  ? Jardiance [Empagliflozin] Itching  ?  Recurrent mycotic infections  ? Penicillins Rash and Other (See Comments)  ?  Has patient had a PCN reaction causing immediate rash, facial/tongue/throat swelling, SOB or lightheadedness with hypotension: No ?Has patient had a PCN reaction causing severe rash involving mucus membranes or skin necrosis: No ?Has patient had a PCN reaction that required  hospitalization No ?Has patient had a PCN reaction occurring within the last 10 years: No ?If all of the above answers are "NO", then may proceed with Cephalosporin use. ?  ? ? ?No orders of the defined types were placed in this encounter. ? ? ?AUTHORIZATION INFORMATION ?Primary Insurance: ?1D#: ?Group #: ? ?Secondary Insurance: ?1D#: ?Group #: ? ?SCHEDULE INFORMATION: ?Date:04/13/2022  ?Time: ?[place] ? ?

## 2022-03-16 ENCOUNTER — Telehealth: Payer: Self-pay

## 2022-03-16 LAB — CBC
Hematocrit: 47.1 % — ABNORMAL HIGH (ref 34.0–46.6)
Hemoglobin: 15.5 g/dL (ref 11.1–15.9)
MCH: 27.4 pg (ref 26.6–33.0)
MCHC: 32.9 g/dL (ref 31.5–35.7)
MCV: 83 fL (ref 79–97)
Platelets: 253 10*3/uL (ref 150–450)
RBC: 5.66 x10E6/uL — ABNORMAL HIGH (ref 3.77–5.28)
RDW: 13 % (ref 11.7–15.4)
WBC: 7.5 10*3/uL (ref 3.4–10.8)

## 2022-03-16 LAB — COMPREHENSIVE METABOLIC PANEL
ALT: 11 IU/L (ref 0–32)
AST: 14 IU/L (ref 0–40)
Albumin/Globulin Ratio: 1.4 (ref 1.2–2.2)
Albumin: 4.4 g/dL (ref 3.8–4.8)
Alkaline Phosphatase: 70 IU/L (ref 44–121)
BUN/Creatinine Ratio: 19 (ref 12–28)
BUN: 17 mg/dL (ref 8–27)
Bilirubin Total: 0.3 mg/dL (ref 0.0–1.2)
CO2: 23 mmol/L (ref 20–29)
Calcium: 9.4 mg/dL (ref 8.7–10.3)
Chloride: 100 mmol/L (ref 96–106)
Creatinine, Ser: 0.88 mg/dL (ref 0.57–1.00)
Globulin, Total: 3.2 g/dL (ref 1.5–4.5)
Glucose: 213 mg/dL — ABNORMAL HIGH (ref 70–99)
Potassium: 4.6 mmol/L (ref 3.5–5.2)
Sodium: 141 mmol/L (ref 134–144)
Total Protein: 7.6 g/dL (ref 6.0–8.5)
eGFR: 72 mL/min/{1.73_m2} (ref >59)

## 2022-03-16 LAB — HEMOGLOBIN A1C
Est. average glucose Bld gHb Est-mCnc: 186 mg/dL
Hgb A1c MFr Bld: 8.1 % — ABNORMAL HIGH (ref 4.8–5.6)

## 2022-03-16 LAB — MAGNESIUM: Magnesium: 1.6 mg/dL (ref 1.6–2.3)

## 2022-03-16 LAB — VITAMIN D 25 HYDROXY (VIT D DEFICIENCY, FRACTURES): Vit D, 25-Hydroxy: 21.5 ng/mL — ABNORMAL LOW (ref 30.0–100.0)

## 2022-03-16 LAB — FERRITIN: Ferritin: 56 ng/mL (ref 15–150)

## 2022-03-16 NOTE — Telephone Encounter (Signed)
Copied from [place] [id number]. Topic: General - Other ?>> Mar 16, 2022 12:17 PM Leward Quan A wrote: ?Reason for CRM: Patient called in to inquire of Dr Caryn Section about the lab results from labs done on 03/10/22 patient stated that she is concerned that it is taking this long for the results. Please call patient and advise    Ph# [phone] ?

## 2022-03-16 NOTE — Telephone Encounter (Signed)
Patient states she had her blood drawn at the lab here in our office on 03/10/2022. I called [facility] customer service to inquire about patients lab results. Lab results are in and will be faxed to our office. The [facility] agent I spoke with was unable to tell me why the results were not in patient's chart. [facility] agent says that she is going to fax a copy of the results to our office.  ?

## 2022-03-17 NOTE — Telephone Encounter (Signed)
Patient called in again state that she need a call back this morning with her lab results say that she have been waiting to hear from someone. Please advise  ?

## 2022-03-18 ENCOUNTER — Encounter: Payer: Self-pay | Admitting: Family Medicine

## 2022-03-18 NOTE — Telephone Encounter (Signed)
Pt called for lab results / she stated she spoke with someone yesterday that stated [facility] should be faxing her labs to the office / please advise asap and update about lab status ?

## 2022-03-18 NOTE — Telephone Encounter (Signed)
A1c Is running high at 8.1, it should be under 7. need to be more strict with your diet and keep taking metformin twice a day. ?vitamin D levels are very low.  Please verify whether she has been taking the weekly vitamin D supplements. If so then we need to increase the dose.  ?

## 2022-03-19 NOTE — Addendum Note (Signed)
Addended by: Lelon Huh E on: 03/19/2022 10:59 AM ? ? Modules accepted: Orders ? ?

## 2022-03-19 NOTE — Telephone Encounter (Signed)
Patient advised and agrees with recommendations. Patient reports that she is not  currently taking any vitamin D supplements. Please advise on the dosage of Vitamin D supplement you recommend she start on.  ?

## 2022-03-19 NOTE — Telephone Encounter (Signed)
Pt advised and agreeable

## 2022-03-19 NOTE — Telephone Encounter (Signed)
Start taking OTC vitamin D3 2,000 units once every day.  ?

## 2022-03-30 ENCOUNTER — Other Ambulatory Visit (HOSPITAL_COMMUNITY): Payer: Self-pay

## 2022-04-01 ENCOUNTER — Ambulatory Visit: Payer: PPO | Admitting: Adult Health

## 2022-04-01 ENCOUNTER — Encounter: Payer: Self-pay | Admitting: Adult Health

## 2022-04-01 ENCOUNTER — Telehealth: Payer: Self-pay | Admitting: Adult Health

## 2022-04-01 VITALS — BP 159/82 | HR 74 | Ht 67.0 in | Wt 167.0 lb

## 2022-04-01 DIAGNOSIS — D151 Benign neoplasm of heart: Secondary | ICD-10-CM

## 2022-04-01 DIAGNOSIS — Z8673 Personal history of transient ischemic attack (TIA), and cerebral infarction without residual deficits: Secondary | ICD-10-CM

## 2022-04-01 DIAGNOSIS — I69398 Other sequelae of cerebral infarction: Secondary | ICD-10-CM

## 2022-04-01 DIAGNOSIS — R278 Other lack of coordination: Secondary | ICD-10-CM

## 2022-04-01 DIAGNOSIS — R269 Unspecified abnormalities of gait and mobility: Secondary | ICD-10-CM | POA: Diagnosis not present

## 2022-04-01 DIAGNOSIS — I69354 Hemiplegia and hemiparesis following cerebral infarction affecting left non-dominant side: Secondary | ICD-10-CM | POA: Diagnosis not present

## 2022-04-01 NOTE — Patient Instructions (Addendum)
Referral placed for outpatient physical and occupational therapy - you will be called to schedule  ? ?Orders placed for walker and commode  ? ?You can try to use a compression glove to see if this helps with swelling and stiffness of your left hand - you can find these online such as amazon or in medical supply stores  ? ?Continue aspirin 81 mg daily  and atorvastatin '40mg'$  daily  for secondary stroke prevention ? ?Continue to follow up with PCP regarding cholesterol, diabetes and blood pressure management  ?Maintain strict control of hypertension with blood pressure goal below 130/90, diabetes with hemoglobin A1c goal below 7.0 % and cholesterol with LDL cholesterol (bad cholesterol) goal below 70 mg/dL.  ? ?Signs of a Stroke? Follow the BEFAST method:  ?Balance Watch for a sudden loss of balance, trouble with coordination or vertigo ?Eyes Is there a sudden loss of vision in one or both eyes? Or double vision?  ?Face: Ask the person to smile. Does one side of the face droop or is it numb?  ?Arms: Ask the person to raise both arms. Does one arm drift downward? Is there weakness or numbness of a leg? ?Speech: Ask the person to repeat a simple phrase. Does the speech sound slurred/strange? Is the person confused ? ?Time: If you observe any of these signs, call 911. ? ? ? ? ? ?Followup in the future with me in 6 months or call earlier if needed ? ? ? ? ? ?Thank you for coming to see [place] at [facility]. I hope we have been able to provide you high quality care today. ? ?You may receive a patient satisfaction survey over the next few weeks. We would appreciate your feedback and comments so that we may continue to improve ourselves and the health of our patients. ? ?

## 2022-04-01 NOTE — Telephone Encounter (Signed)
Referral for Occupational Therapy sent to [place] [phone]. ?

## 2022-04-01 NOTE — Progress Notes (Signed)
?[facility] ?[address] ?[place]. [place] [address] ?[phone] ? ?     STROKE FOLLOW UP NOTE ? ?[name] ?Date of Birth:  June 18, 1956 ?Medical Record Number:  [id number]  ? ?Reason for Referral: stroke follow up ? ? ? ?SUBJECTIVE: ? ? ?CHIEF COMPLAINT:  ?Chief Complaint  ?Patient presents with  ? Follow-up  ?  Rm 2 with grandkids  ?Pt is well, has been improving some but having stiffness in L hand but overall stable   ? ? ?HPI:  ? ?Update 04/01/2022 JM: Patient returns for 87-monthstroke follow-up accompanied by her grandchildren.  Overall stable from stroke standpoint without new stroke/TIA symptoms.  Does report continued left-sided weakness and left hand stiffness and pain.. she is concerned that she may have arthritis in her hand and is asking for medication to help with the pain.  She completed home health therapy but did not see much benefit.  She is interested in pursuing outpatient therapy.  She ambulates with walker, will use RW inside home at times, will use at all times outdoors. No recent falls. She is requesting a new order for RW and bedside commode.  Compliant on aspirin and atorvastatin, denies side effects.  Blood pressure today 159/82.  Routinely followed by PCP and PMR.  No further concerns at this time. ? ? ? ? ?History provided for reference purposes only ?Initial visit 09/30/2021 JM: [name] being seen for hospital follow-up accompanied by her son. She was initially recovering very well from a stroke standpoint with mild LUE >LLE numbness and ambulating with a cane. 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  Therapy eval's recommended CIR for decreased functional mobility - d/c to CIR on 8/29.  She required incision debridement of sternal wound and application of VAC eventually discontinued on  9/29.  She was discharged home on 9/30 (after a 32 day stay).  She does endorse minor setback since that time but left-sided weakness has been gradually improving.  She is currently ambulating with a rolling walker.  She has not yet started therapies - reports they just haven't showed up yet.  Cardiology and PMR further investigating.  She is currently residing with her daughter and is eager to return back home.  Currently maintaining ADLs independently.  Denies new stroke/TIA symptoms.  Compliant on aspirin and atorvastatin without side effects.  Blood pressure today 132/84.  Routinely monitors at home and typically stable. Recent A1c 7.8 down from 9.3.  She has follow-up with cardiology this afternoon.  No further concerns at this time ? ?Stroke admission 04/18/2021 ?TLURENA NAEVEis a 66y.o. female who has a past medical history of poorly controlled diabetes and hypertension, hyperlipidemia, obesity, anxiety, depression, who presented on 04/18/2021 with acute dizziness left-sided weakness and left-sided numbness [facility].  [facility]  Stroke work-up revealed right thalamic, right occipital left-sided splenium corpus callosum and right cerebellum infarct in setting of right P2/PCA stenosis s/p thrombectomy with TICI 3 reperfusion, embolic secondary to unknown source.  CTA head/neck 50% stenosis proximal right ICA.  EF 70 to 75% without cardiac source of embolus identified.  TEE no evidence for cardiac source of embolism but did show well-circumscribed mass in the right RCC.  Eval by cardiology with plans on surgical procedure once discharged and recommended continue DAPT until  cardiac procedure.  LDL 105 -initiate atorvastatin 40 mg daily.  A1c 9.3.  Therapy eval's recommended CIR with residual left hemiparesis with hemisensory impairment. ? ? ? ? ? ? ?ROS:   ?14 system review of systems performed and negative with exception of those listed in HPI ? ?PMH:  ?Past Medical  History:  ?Diagnosis Date  ? Anxiety   ? Arthritis   ? CAD (coronary artery disease)   ? a. 3 vessel CAD noted on coronary CTA 04/2021, s/p CABG x4  ? Cataract   ? left  ? Depression   ? Diabetes mellitus without complication ([place])   ? type 2  ? Environmental and seasonal allergies   ? GERD (gastroesophageal reflux disease)   ? Hyperlipidemia   ? Hypertension   ? Joint pain   ? as reported by patient  ? Papillary fibroelastoma of heart   ? of the aortic valve,  s/p AVR and tumor resection on 08/04/2021  ? Right thalamic infarction ([name]) 04/29/2021  ? S/P AVR (aortic valve replacement)   ? s/p AVR with resection of AV fibroelastoma on 08/04/2021  ? S/P CABG x 4   ? a. s/p CABG x4 LIMA to LAD, SVG to PDA, and Sequential SVG to OM and Diagonal on 8/  ? Stroke [facility]) 04/2021  ? admitted at [facility] - left sided weakness  ? Urinary incontinence   ? as stated by patient  ? ? ?PSH:  ?Past Surgical History:  ?Procedure Laterality Date  ? AORTIC VALVE REPLACEMENT N/A 08/04/2021  ? Procedure: RESECTION AORTIC VALVE TUMOR;  Surgeon: [name], MD;  Location: [place];  Service: Open Heart Surgery;  Laterality: N/A;  ? APPLICATION OF WOUND VAC N/A 08/24/2021  ? Procedure: APPLICATION OF WOUND VAC;  Surgeon: Gaye Pollack, MD;  Location: [place];  Service: Thoracic;  Laterality: N/A;  ? APPLICATION OF WOUND VAC N/A 08/28/2021  ? Procedure: WOUND VAC CHANGE;  Surgeon: [name], MD;  Location: [place];  Service: Thoracic;  Laterality: N/A;  ? BREAST EXCISIONAL BIOPSY Right 1999  ? neg  ? BUBBLE STUDY  04/24/2021  ? Procedure: BUBBLE STUDY;  Surgeon: Sueanne Margarita, MD;  Location: [place];  Service: Cardiovascular;;  ? CATARACT EXTRACTION  12/2011  ? CATARACT EXTRACTION W/PHACO Left 12/02/2016  ? Procedure: CATARACT EXTRACTION PHACO AND INTRAOCULAR LENS PLACEMENT (IOC);  Surgeon: Estill Cotta, MD;  Location: [facility];  Service: Ophthalmology;  Laterality: Left;  [place] 2:14 ?AP% 28.4 ?CDE 59.73 ?Fluid pack  lot # [id number]  ? CORONARY ARTERY BYPASS GRAFT N/A 08/04/2021  ? Procedure: CORONARY ARTERY BYPASS GRAFTING (CABG) X  4  USING LEFT INTERNAL MAMMARY ARTERY AND RIGHT GREATER SAPHENOUS VEIN ENDOSCOPIC CONDUITS;  Surgeon: [name], MD;  Location: [place];  Service: Open Heart Surgery;  Laterality: N/A;  ? DIAGNOSTIC LAPAROSCOPY    ? EYE SURGERY    ? IR CT HEAD LTD  04/19/2021  ? IR PERCUTANEOUS ART THROMBECTOMY/INFUSION INTRACRANIAL INC DIAG ANGIO  04/19/2021  ?    ? IR PERCUTANEOUS ART THROMBECTOMY/INFUSION INTRACRANIAL INC DIAG ANGIO  04/19/2021  ? IR US GUIDE VASC ACCESS LEFT  04/19/2021  ? RADIOLOGY WITH ANESTHESIA N/A 04/19/2021  ? Procedure: IR WITH ANESTHESIA;  Surgeon: Luanne Bras, MD;  Location: [place];  Service: Radiology;  Laterality: N/A;  ? STERNAL WOUND DEBRIDEMENT N/A 08/24/2021  ? Procedure: STERNAL WOUND DRAINAGE AND DEBRIDEMENT;  Surgeon: Gaye Pollack, MD;  Location: [place];  Service: Thoracic;  Laterality: N/A;  ? STERNAL WOUND DEBRIDEMENT N/A 08/28/2021  ? Procedure: STERNAL WOUND DEBRIDEMENT;  Surgeon: [name], MD;  Location: [place];  Service: Thoracic;  Laterality: N/A;  ? TEE WITHOUT CARDIOVERSION N/A 04/24/2021  ? Procedure: TRANSESOPHAGEAL ECHOCARDIOGRAM (TEE);  Surgeon: Sueanne Margarita, MD;  Location: [facility];  Service: Cardiovascular;  Laterality: N/A;  ? TEE WITHOUT CARDIOVERSION N/A 08/04/2021  ? Procedure: TRANSESOPHAGEAL ECHOCARDIOGRAM (TEE);  Surgeon: [name], MD;  Location: [place];  Service: Open Heart Surgery;  Laterality: N/A;  ? TOTAL HIP ARTHROPLASTY Right 04/04/2018  ? Procedure: TOTAL HIP ARTHROPLASTY;  Surgeon: Dereck Leep, MD;  Location: [facility];  Service: Orthopedics;  Laterality: Right;  ? ? ?Social History:  ?Social History  ? ?Socioeconomic History  ? Marital status: Widowed  ?  Spouse name: Not on file  ? Number of children: 2  ? Years of education: Not on file  ? Highest education level: 12th grade  ?Occupational  History  ?  Employer: [employer]  ?Tobacco Use  ? Smoking status: Former  ?  Packs/day: 1.50  ?  Years: 25.00  ?  Pack years: 37.50  ?  Types: Cigarettes  ?  Quit date: 03/24/2007  ?  Years since quitting: 1

## 2022-04-01 NOTE — Telephone Encounter (Signed)
Referral for Physical Therapy sent to [place] [facility] [phone]. ?

## 2022-04-02 ENCOUNTER — Other Ambulatory Visit (HOSPITAL_COMMUNITY): Payer: Self-pay

## 2022-04-03 ENCOUNTER — Other Ambulatory Visit (HOSPITAL_COMMUNITY): Payer: Self-pay

## 2022-04-13 ENCOUNTER — Ambulatory Visit: Payer: PPO | Admitting: Anesthesiology

## 2022-04-13 ENCOUNTER — Encounter: Payer: Self-pay | Admitting: Physical Medicine and Rehabilitation

## 2022-04-13 ENCOUNTER — Encounter
Admission: RE | Disposition: A | Discharge status: Home / Self Care | Payer: Self-pay | Source: Home / Self Care | Attending: Gastroenterology

## 2022-04-13 ENCOUNTER — Ambulatory Visit
Admission: RE | Admit: 2022-04-13 | Discharge: 2022-04-13 | Disposition: A | Discharge status: Home / Self Care | Payer: PPO | Attending: Gastroenterology | Admitting: Gastroenterology

## 2022-04-13 DIAGNOSIS — Z1211 Encounter for screening for malignant neoplasm of colon: Secondary | ICD-10-CM | POA: Diagnosis not present

## 2022-04-13 DIAGNOSIS — F32A Depression, unspecified: Secondary | ICD-10-CM | POA: Diagnosis not present

## 2022-04-13 DIAGNOSIS — I10 Essential (primary) hypertension: Secondary | ICD-10-CM | POA: Diagnosis not present

## 2022-04-13 DIAGNOSIS — Z86010 Personal history of colonic polyps: Secondary | ICD-10-CM

## 2022-04-13 DIAGNOSIS — K5730 Diverticulosis of large intestine without perforation or abscess without bleeding: Secondary | ICD-10-CM | POA: Diagnosis not present

## 2022-04-13 DIAGNOSIS — E785 Hyperlipidemia, unspecified: Secondary | ICD-10-CM | POA: Insufficient documentation

## 2022-04-13 DIAGNOSIS — Z952 Presence of prosthetic heart valve: Secondary | ICD-10-CM | POA: Insufficient documentation

## 2022-04-13 DIAGNOSIS — Z87891 Personal history of nicotine dependence: Secondary | ICD-10-CM | POA: Insufficient documentation

## 2022-04-13 DIAGNOSIS — I2510 Atherosclerotic heart disease of native coronary artery without angina pectoris: Secondary | ICD-10-CM | POA: Diagnosis not present

## 2022-04-13 DIAGNOSIS — F419 Anxiety disorder, unspecified: Secondary | ICD-10-CM | POA: Diagnosis not present

## 2022-04-13 DIAGNOSIS — K219 Gastro-esophageal reflux disease without esophagitis: Secondary | ICD-10-CM | POA: Insufficient documentation

## 2022-04-13 DIAGNOSIS — G8194 Hemiplegia, unspecified affecting left nondominant side: Secondary | ICD-10-CM | POA: Insufficient documentation

## 2022-04-13 DIAGNOSIS — M1990 Unspecified osteoarthritis, unspecified site: Secondary | ICD-10-CM | POA: Diagnosis not present

## 2022-04-13 DIAGNOSIS — E119 Type 2 diabetes mellitus without complications: Secondary | ICD-10-CM | POA: Insufficient documentation

## 2022-04-13 DIAGNOSIS — Z951 Presence of aortocoronary bypass graft: Secondary | ICD-10-CM | POA: Insufficient documentation

## 2022-04-13 DIAGNOSIS — N182 Chronic kidney disease, stage 2 (mild): Secondary | ICD-10-CM

## 2022-04-13 HISTORY — PX: COLONOSCOPY WITH PROPOFOL: SHX5780

## 2022-04-13 LAB — GLUCOSE, CAPILLARY: Glucose-Capillary: 166 mg/dL — ABNORMAL HIGH (ref 70–99)

## 2022-04-13 SURGERY — COLONOSCOPY WITH PROPOFOL
Anesthesia: General

## 2022-04-13 MED ORDER — LIDOCAINE HCL (CARDIAC) PF 100 MG/5ML IV SOSY
PREFILLED_SYRINGE | INTRAVENOUS | Status: DC | PRN
  Administered 2022-04-13: 60 mg via INTRAVENOUS

## 2022-04-13 MED ORDER — PROPOFOL 10 MG/ML IV BOLUS
INTRAVENOUS | Status: DC | PRN
  Administered 2022-04-13: 70 mg via INTRAVENOUS

## 2022-04-13 MED ORDER — PROPOFOL 500 MG/50ML IV EMUL
INTRAVENOUS | Status: DC | PRN
  Administered 2022-04-13: 140 ug/kg/min via INTRAVENOUS

## 2022-04-13 MED ORDER — SODIUM CHLORIDE 0.9 % IV SOLN: INTRAVENOUS | Status: DC

## 2022-04-13 NOTE — H&P (Signed)
?Cephas Darby, MD ?[address]  ?[address]  ?[place], [place] [address]  ?Main: [phone]  ?Fax: [phone] ?Pager: [phone] ? ?Primary Care Physician:  Birdie Sons, MD ?Primary Gastroenterologist:  Dr. Cephas Darby ? ?Pre-Procedure History & Physical: ?HPI:  [name] is a 66 y.o. female is here for an colonoscopy. ?  ?Past Medical History:  ?Diagnosis Date  ? Anxiety   ? Arthritis   ? CAD (coronary artery disease)   ? a. 3 vessel CAD noted on coronary CTA 04/2021, s/p CABG x4  ? Cataract   ? left  ? Depression   ? Diabetes mellitus without complication ([place])   ? type 2  ? Environmental and seasonal allergies   ? GERD (gastroesophageal reflux disease)   ? Hyperlipidemia   ? Hypertension   ? Joint pain   ? as reported by patient  ? Papillary fibroelastoma of heart   ? of the aortic valve,  s/p AVR and tumor resection on 08/04/2021  ? Right thalamic infarction ([place]) 04/29/2021  ? S/P AVR (aortic valve replacement)   ? s/p AVR with resection of AV fibroelastoma on 08/04/2021  ? S/P CABG x 4   ? a. s/p CABG x4 LIMA to LAD, SVG to PDA, and Sequential SVG to OM and Diagonal on 8/  ? Stroke [facility]) 04/2021  ? admitted at [facility] - left sided weakness  ? Urinary incontinence   ? as stated by patient  ? ? ?Past Surgical History:  ?Procedure Laterality Date  ? AORTIC VALVE REPLACEMENT N/A 08/04/2021  ? Procedure: RESECTION AORTIC VALVE TUMOR;  Surgeon: [name], MD;  Location: [place];  Service: Open Heart Surgery;  Laterality: N/A;  ? APPLICATION OF WOUND VAC N/A 08/24/2021  ? Procedure: APPLICATION OF WOUND VAC;  Surgeon: Gaye Pollack, MD;  Location: [place];  Service: Thoracic;  Laterality: N/A;  ? APPLICATION OF WOUND VAC N/A 08/28/2021  ? Procedure: WOUND VAC CHANGE;  Surgeon: [name], MD;  Location: [place];  Service: Thoracic;  Laterality: N/A;  ? BREAST EXCISIONAL BIOPSY Right 1999  ? neg  ? BUBBLE STUDY  04/24/2021  ? Procedure: BUBBLE STUDY;  Surgeon: Sueanne Margarita, MD;  Location: [place];  Service: Cardiovascular;;  ? CATARACT EXTRACTION  12/2011  ? CATARACT EXTRACTION W/PHACO Left 12/02/2016  ? Procedure: CATARACT EXTRACTION PHACO AND INTRAOCULAR LENS PLACEMENT (IOC);  Surgeon: Estill Cotta, MD;  Location: [facility];  Service: Ophthalmology;  Laterality: Left;  [place] 2:14 ?AP% 28.4 ?CDE 59.73 ?Fluid pack lot # [id number]  ? CORONARY ARTERY BYPASS GRAFT N/A 08/04/2021  ? Procedure: CORONARY ARTERY BYPASS GRAFTING (CABG) X  4  USING LEFT INTERNAL MAMMARY ARTERY AND RIGHT GREATER SAPHENOUS VEIN ENDOSCOPIC CONDUITS;  Surgeon: [name], MD;  Location: [place];  Service: Open Heart Surgery;  Laterality: N/A;  ? DIAGNOSTIC LAPAROSCOPY    ? EYE SURGERY    ? IR CT HEAD LTD  04/19/2021  ? IR PERCUTANEOUS ART THROMBECTOMY/INFUSION INTRACRANIAL INC DIAG ANGIO  04/19/2021  ?    ? IR PERCUTANEOUS ART THROMBECTOMY/INFUSION INTRACRANIAL INC DIAG ANGIO  04/19/2021  ? IR US GUIDE VASC ACCESS LEFT  04/19/2021  ? RADIOLOGY WITH ANESTHESIA N/A 04/19/2021  ? Procedure: IR WITH ANESTHESIA;  Surgeon: Luanne Bras, MD;  Location: [place];  Service: Radiology;  Laterality: N/A;  ? STERNAL WOUND DEBRIDEMENT N/A 08/24/2021  ? Procedure: STERNAL WOUND DRAINAGE AND DEBRIDEMENT;  Surgeon: Gaye Pollack, MD;  Location: [place] [facility];  Service: Thoracic;  Laterality: N/A;  ? STERNAL WOUND DEBRIDEMENT N/A 08/28/2021  ? Procedure: STERNAL WOUND DEBRIDEMENT;  Surgeon: [name], MD;  Location: [place];  Service: Thoracic;  Laterality: N/A;  ? TEE WITHOUT CARDIOVERSION N/A 04/24/2021  ? Procedure: TRANSESOPHAGEAL ECHOCARDIOGRAM (TEE);  Surgeon: Sueanne Margarita, MD;  Location: [facility];  Service: Cardiovascular;  Laterality: N/A;  ? TEE WITHOUT CARDIOVERSION N/A 08/04/2021  ? Procedure: TRANSESOPHAGEAL ECHOCARDIOGRAM (TEE);  Surgeon: [name], MD;  Location: [place];  Service: Open Heart Surgery;  Laterality: N/A;  ? TOTAL HIP ARTHROPLASTY Right 04/04/2018  ?  Procedure: TOTAL HIP ARTHROPLASTY;  Surgeon: Dereck Leep, MD;  Location: [facility];  Service: Orthopedics;  Laterality: Right;  ? ? ?Prior to Admission medications   ?Medication Sig Start Date End Date Taking? Authorizing Provider  ?ALPRAZolam (XANAX) 0.5 MG tablet TAKE 1 TABLET BY MOUTH 2 TIMES DAILY AS NEEDED FOR ANXIETY. 12/05/21  Yes Birdie Sons, MD  ?amLODipine (NORVASC) 10 MG tablet Take 1 tablet (10 mg total) by mouth daily. 12/05/21  Yes Birdie Sons, MD  ?aspirin 81 MG EC tablet Take 1 tablet (81 mg total) by mouth daily. Swallow whole. 03/09/22  Yes Birdie Sons, MD  ?atorvastatin (LIPITOR) 40 MG tablet Take 1 tablet (40 mg total) by mouth daily. 12/05/21  Yes Birdie Sons, MD  ?docusate sodium (COLACE) 50 MG capsule Take 1 capsule (50 mg total) by mouth 2 (two) times daily. 09/11/21  Yes Angiulli, Lavon Paganini, PA-C  ?gabapentin (NEURONTIN) 300 MG capsule Take 1-2 capsules (300-600 mg total) by mouth at bedtime. 12/05/21  Yes Birdie Sons, MD  ?glyBURIDE (DIABETA) 5 MG tablet Take 1 tablet (5 mg total) by mouth daily with breakfast. 12/05/21  Yes Birdie Sons, MD  ?losartan (COZAAR) 25 MG tablet Take 3 tablets (75 mg total) by mouth daily. 12/05/21  Yes Birdie Sons, MD  ?metFORMIN (GLUCOPHAGE) 500 MG tablet Take 1 tablet (500 mg total) by mouth 2 (two) times daily with a meal. 12/05/21  Yes Birdie Sons, MD  ?methocarbamol (ROBAXIN) 500 MG tablet Take 1 tablet (500 mg total) by mouth 2 (two) times daily as needed for muscle spasms. 12/05/21  Yes Birdie Sons, MD  ?Multiple Vitamin (MULTIVITAMIN) tablet Take 1 tablet by mouth daily.   Yes [provider]  ?sertraline (ZOLOFT) 100 MG tablet Take 1 tablet (100 mg total) by mouth daily. 10/30/21  Yes Birdie Sons, MD  ?acetaminophen (TYLENOL) 325 MG tablet Take 2 tablets (650 mg total) by mouth every 4 (four) hours as needed for mild pain (or temp > 37.5 C (99.5 F)). 04/29/21   Olivencia-Simmons, Prince Rome, NP   ?albuterol (VENTOLIN HFA) 108 (90 Base) MCG/ACT inhaler Inhale 2 puffs into the lungs every 4 (four) hours as needed for wheezing or shortness of breath. ?Patient not taking: Reported on 04/13/2022 11/05/21   Rodriguez-Southworth, Sunday Spillers, PA-C  ?Blood Glucose Monitoring Suppl (FREESTYLE FREEDOM LITE) w/Device KIT USE AS DIRECTED 11/21/21   Birdie Sons, MD  ?Blood Pressure Monitoring (OMRON 3 SERIES BP MONITOR) DEVI Use as directed to monitor blood pressure 10/17/21   Raulkar, Clide Deutscher, MD  ?glucose blood (FREESTYLE LITE) test strip Also needs Lancets. Use to check blood sugar up to four times a day for insulin dependant diabetes 06/20/21   Birdie Sons, MD  ?guaiFENesin (MUCINEX) 600 MG 12 hr tablet Take 1 tablet (600 mg total) by mouth 2 (  two) times daily. 10/31/21 10/31/22  Izora Ribas, MD  ?latanoprost (XALATAN) 0.005 % ophthalmic solution Place 1 drop into the left eye at bedtime. 02/24/22   [provider]  ?lidocaine (LIDODERM) 5 % Place 1 patch onto the skin daily. Remove & Discard patch within 12 hours or as directed by MD 09/11/21   Cathlyn Parsons, PA-C  ?Magnesium Glycinate 100 MG CAPS Take 1 tablet by mouth at bedtime. 09/26/21   Raulkar, Clide Deutscher, MD  ?ondansetron (ZOFRAN) 4 MG tablet Take 1 tablet (4 mg total) by mouth every 8 (eight) hours as needed for nausea or vomiting. 03/11/22   Lin Landsman, MD  ? ? ?Allergies as of 03/11/2022 - Review Complete 03/11/2022  ?Allergen Reaction Noted  ? Jardiance [empagliflozin] Itching 07/07/2016  ? Penicillins Rash and Other (See Comments) 03/11/2015  ? ? ?Family History  ?Problem Relation Age of Onset  ? Bone cancer Brother   ? Aneurysm Mother   ?     brain  ? Diabetes Father   ? CAD Father   ? Heart failure Father   ? Colon cancer Father   ? Cancer Father   ? Alzheimer's disease Paternal Grandmother   ? Dementia Paternal Grandmother   ? Mental illness Paternal Grandfather   ? Healthy Daughter   ? Healthy Son   ? Breast cancer Neg Hx    ? ? ?Social History  ? ?Socioeconomic History  ? Marital status: Widowed  ?  Spouse name: Not on file  ? Number of children: 2  ? Years of education: Not on file  ? Highest education level: 12th grade  ?[place]

## 2022-04-13 NOTE — Anesthesia Preprocedure Evaluation (Signed)
Anesthesia Evaluation  ?Patient identified by MRN, date of birth, ID band ?Patient awake ? ? ? ?Reviewed: ?Allergy & Precautions, NPO status , Patient's Chart, lab work & pertinent test results ? ?History of Anesthesia Complications ?Negative for: history of anesthetic complications ? ?Airway ?Mallampati: III ? ?TM Distance: >3 FB ?Neck ROM: Full ? ? ? Dental ? ?(+) Partial Lower, Poor Dentition ?  ?Pulmonary ?neg pulmonary ROS, neg sleep apnea, neg COPD, Patient abstained from smoking.Not current smoker, former smoker,  ?  ?Pulmonary exam normal ?breath sounds clear to auscultation ? ? ? ? ? ? Cardiovascular ?Exercise Tolerance: Poor ?METS: < 3 Mets hypertension, + CAD and + CABG  ?(-) Past MI (-) dysrhythmias  ?Rhythm:Regular Rate:Normal ?- Systolic murmurs ?S/p CABG and aortic valve repair. ?Doing well since; she is limited in her exercise tolerance b y residual left sided weakness from stroke ?  ?Neuro/Psych ?PSYCHIATRIC DISORDERS Anxiety Depression Residual left sided symptoms ?CVA, Residual Symptoms   ? GI/Hepatic ?GERD  ,(+)  ?  ? (-) substance abuse ? ,   ?Endo/Other  ?diabetes ? Renal/GU ?negative Renal ROS  ? ?  ?Musculoskeletal ? ?(+) Arthritis ,  ? Abdominal ?  ?Peds ? Hematology ?  ?Anesthesia Other Findings ?Past Medical History: ?No date: Anxiety ?No date: Arthritis ?No date: CAD (coronary artery disease) ?    Comment:  a. 3 vessel CAD noted on coronary CTA 04/2021, s/p CABG  ?             x4 ?No date: Cataract ?    Comment:  left ?No date: Depression ?No date: Diabetes mellitus without complication ([place]) ?    Comment:  type 2 ?No date: Environmental and seasonal allergies ?No date: GERD (gastroesophageal reflux disease) ?No date: Hyperlipidemia ?No date: Hypertension ?No date: Joint pain ?    Comment:  as reported by patient ?No date: Papillary fibroelastoma of heart ?    Comment:  of the aortic valve,  s/p AVR and tumor resection on  ?              08/04/2021 ?04/29/2021: Right thalamic infarction [facility]) ?No date: S/P AVR (aortic valve replacement) ?    Comment:  s/p AVR with resection of AV fibroelastoma on 08/04/2021 ?No date: S/P CABG x 4 ?    Comment:  a. s/p CABG x4 LIMA to LAD, SVG to PDA, and Sequential  ?             SVG to OM and Diagonal on 8/ ?04/2021: Stroke [facility]) ?    Comment:  admitted at [facility] - left sided weakness ?No date: Urinary incontinence ?    Comment:  as stated by patient ? Reproductive/Obstetrics ? ?  ? ? ? ? ? ? ? ? ? ? ? ? ? ?  ?  ? ? ? ? ? ? ? ? ?Anesthesia Physical ?Anesthesia Plan ? ?ASA: 3 ? ?Anesthesia Plan: General  ? ?Post-op Pain Management: Minimal or no pain anticipated  ? ?Induction: Intravenous ? ?PONV Risk Score and Plan: 3 and Propofol infusion, TIVA and Ondansetron ? ?Airway Management Planned: Nasal Cannula ? ?Additional Equipment: None ? ?Intra-op Plan:  ? ?Post-operative Plan:  ? ?Informed Consent: I have reviewed the patients History and Physical, chart, labs and discussed the procedure including the risks, benefits and alternatives for the proposed anesthesia with the patient or authorized representative who has indicated his/her understanding and acceptance.  ? ? ? ?Dental advisory given ? ?Plan Discussed with: CRNA  and Surgeon ? ?Anesthesia Plan Comments: (Discussed risks of anesthesia with patient, including possibility of difficulty with spontaneous ventilation under anesthesia necessitating airway intervention, PONV, and rare risks such as cardiac or respiratory or neurological events, and allergic reactions. Discussed the role of CRNA in patient's perioperative care. Patient understands.)  ? ? ? ? ? ? ?Anesthesia Quick Evaluation ? ?

## 2022-04-13 NOTE — Transfer of Care (Signed)
Immediate Anesthesia Transfer of Care Note ? ?Patient: [name] ? ?Procedure(s) Performed: COLONOSCOPY WITH PROPOFOL ? ?Patient Location: PACU ? ?Anesthesia Type:General ? ?Level of Consciousness: awake, alert  and oriented ? ?Airway & Oxygen Therapy: Patient Spontanous Breathing ? ?Post-op Assessment: Report given to RN and Post -op Vital signs reviewed and stable ? ?Post vital signs: Reviewed and stable ? ?Last Vitals:  ?Vitals Value Taken Time  ?BP 112/74 04/13/22 0915  ?Temp 36.1 ?C 04/13/22 0915  ?Pulse 86 04/13/22 0915  ?Resp 17 04/13/22 0915  ?SpO2 96 % 04/13/22 0915  ?Vitals shown include unvalidated device data. ? ?Last Pain:  ?Vitals:  ? 04/13/22 0915  ?TempSrc: Tympanic  ?PainSc: Asleep  ?   ? ?  ? ?Complications: No notable events documented. ?

## 2022-04-13 NOTE — Anesthesia Postprocedure Evaluation (Signed)
Anesthesia Post Note ? ?Patient: [name] ? ?Procedure(s) Performed: COLONOSCOPY WITH PROPOFOL ? ?Patient location during evaluation: Endoscopy ?Anesthesia Type: General ?Level of consciousness: awake and alert ?Pain management: pain level controlled ?Vital Signs Assessment: post-procedure vital signs reviewed and stable ?Respiratory status: spontaneous breathing, nonlabored ventilation, respiratory function stable and patient connected to nasal cannula oxygen ?Cardiovascular status: blood pressure returned to baseline and stable ?Postop Assessment: no apparent nausea or vomiting ?Anesthetic complications: no ? ? ?No notable events documented. ? ? ?Last Vitals:  ?Vitals:  ? 04/13/22 0925 04/13/22 0935  ?BP: 119/73 (!) 143/87  ?Pulse: 81 79  ?Resp: 18 17  ?Temp:    ?SpO2: 97% 98%  ?  ?Last Pain:  ?Vitals:  ? 04/13/22 0935  ?TempSrc:   ?PainSc: 0-No pain  ? ? ?  ?  ?  ?  ?  ?  ? ?Arita Miss ? ? ? ? ?

## 2022-04-13 NOTE — Op Note (Signed)
[facility] ?Gastroenterology ?Patient Name: [name] ?Procedure Date: 04/13/2022 8:36 AM ?MRN: [id number] ?Account #: [id number] ?Date of Birth: 01-Dec-1956 ?Admit Type: Outpatient ?Age: 66 ?Room: [facility] ENDO ROOM 1 ?Gender: Female ?Note Status: Finalized ?Instrument Name: Colonoscope [id number] ?Procedure:             Colonoscopy ?Indications:           Screening for colorectal malignant neoplasm, Last  ?                       colonoscopy: October 2008, Last colonoscopy 10 years  ?                       ago ?Providers:             Lin Landsman MD, MD ?Referring MD:          Kirstie Peri. Caryn Section, MD (Referring MD) ?Medicines:             General Anesthesia ?Complications:         No immediate complications. Estimated blood loss: None. ?Procedure:             Pre-Anesthesia Assessment: ?                       - Prior to the procedure, a History and Physical was  ?                       performed, and patient medications and allergies were  ?                       reviewed. The patient is competent. The risks and  ?                       benefits of the procedure and the sedation options and  ?                       risks were discussed with the patient. All questions  ?                       were answered and informed consent was obtained.  ?                       Patient identification and proposed procedure were  ?                       verified by the physician, the nurse, the  ?                       anesthesiologist, the anesthetist and the technician  ?                       in the pre-procedure area in the procedure room in the  ?                       endoscopy suite. Mental Status Examination: alert and  ?                       oriented. Airway Examination: normal oropharyngeal  ?  airway and neck mobility. Respiratory Examination:  ?                       clear to auscultation. CV Examination: normal.  ?                       Prophylactic Antibiotics: The patient does  not require  ?                       prophylactic antibiotics. Prior Anticoagulants: The  ?                       patient has taken no previous anticoagulant or  ?                       antiplatelet agents. ASA Grade Assessment: III - A  ?                       patient with severe systemic disease. After reviewing  ?                       the risks and benefits, the patient was deemed in  ?                       satisfactory condition to undergo the procedure. The  ?                       anesthesia plan was to use general anesthesia.  ?                       Immediately prior to administration of medications,  ?                       the patient was re-assessed for adequacy to receive  ?                       sedatives. The heart rate, respiratory rate, oxygen  ?                       saturations, blood pressure, adequacy of pulmonary  ?                       ventilation, and response to care were monitored  ?                       throughout the procedure. The physical status of the  ?                       patient was re-assessed after the procedure. ?                       After obtaining informed consent, the colonoscope was  ?                       passed under direct vision. Throughout the procedure,  ?                       the patient's blood pressure, pulse, and oxygen  ?  saturations were monitored continuously. The  ?                       Colonoscope was introduced through the anus and  ?                       advanced to the the cecum, identified by appendiceal  ?                       orifice and ileocecal valve. The colonoscopy was  ?                       performed without difficulty. The patient tolerated  ?                       the procedure well. The quality of the bowel  ?                       preparation was evaluated using the BBPS [facility] Bowel  ?                       Preparation Scale) with scores of: Right Colon = 3,  ?                       Transverse Colon = 3 and  Left Colon = 3 (entire mucosa  ?                       seen well with no residual staining, small fragments  ?                       of stool or opaque liquid). The total BBPS score  ?                       equals 9. ?Findings: ?     The perianal and digital rectal examinations were normal. Pertinent  ?     negatives include normal sphincter tone and no palpable rectal lesions. ?     Multiple diverticula were found in the recto-sigmoid colon, sigmoid  ?     colon, descending colon and ascending colon. ?     The retroflexed view of the distal rectum and anal verge was normal and  ?     showed no anal or rectal abnormalities. ?Impression:            - Diverticulosis in the recto-sigmoid colon, in the  ?                       sigmoid colon, in the descending colon and in the  ?                       ascending colon. ?                       - The distal rectum and anal verge are normal on  ?                       retroflexion view. ?                       -  No specimens collected. ?Recommendation:        - Discharge patient to home (with escort). ?                       - Resume previous diet today. ?                       - Continue present medications. ?                       - Repeat colonoscopy in 10 years for screening  ?                       purposes. ?Procedure Code(s):     --- Professional --- ?                       W5462, Colorectal cancer screening; colonoscopy on  ?                       individual not meeting criteria for high risk ?Diagnosis Code(s):     --- Professional --- ?                       Z12.11, Encounter for screening for malignant neoplasm  ?                       of colon ?                       K57.30, Diverticulosis of large intestine without  ?                       perforation or abscess without bleeding ?CPT copyright 2019 American Medical Association. All rights reserved. ?The codes documented in this report are preliminary and upon coder review may  ?be revised to meet current  compliance requirements. ?Dr. Ulyess Mort ?Emilliano Dilworth Raeanne Gathers MD, MD ?04/13/2022 9:12:44 AM ?This report has been signed electronically. ?Number of Addenda: 0 ?Note Initiated On: 04/13/2022 8:36 AM ?Scope Withdrawal Time: 0 hours 6 minutes 37 seconds  ?Total Procedure Duration: 0 hours 12 minutes 14 seconds  ?Estimated Blood Loss:  Estimated blood loss: none. ?     [facility] ?

## 2022-04-14 ENCOUNTER — Encounter: Payer: Self-pay | Admitting: Gastroenterology

## 2022-04-27 ENCOUNTER — Telehealth: Payer: Self-pay

## 2022-04-27 NOTE — Telephone Encounter (Signed)
Copied from [place] [id number]. Topic: General - Call Back - No Documentation ?>> Apr 27, 2022  4:03 PM Pawlus, Brayton Layman A wrote: ?Reason for CRM: Pt stated she missed a call from [facility] and wanted to know what it was regarding, please asdvise. ?

## 2022-04-27 NOTE — Telephone Encounter (Signed)
Im not sure who called patient. I returned patient's call and advised her this. ?

## 2022-05-01 ENCOUNTER — Ambulatory Visit: Payer: Self-pay

## 2022-05-01 NOTE — Telephone Encounter (Signed)
  Chief Complaint: Concerned about side effects from gabapentin. Symptoms: none Frequency:  Pertinent Negatives: Patient denies any side effects Disposition: '[]'$ ED /'[]'$ Urgent Care (no appt availability in office) / '[]'$ Appointment(In office/virtual)/ '[]'$  [facility]/ '[]'$ Home Care/ '[]'$ Refused Recommended Disposition /'[]'$  Mobile Bus/ '[x]'$  Follow-up with PCP Additional Notes: Pt was prescribed gabapentin in hospital for leg pain. Patient saw something on TV that listed memory loss as a side effect. Pt does not want memory loss. She would like different medication for her leg pain. She was n a muscle relaxant which she said worked well for her. Pt would like to review medications with Dr. Caryn Section.    Summary: rx concern / rx request   The patient has seen information on tv that has them concerned with their medication Rx #: [id number]  gabapentin (NEURONTIN) 300 MG capsule [[id number]]   The patient learned that this medication may cause memory loss   The patient would like to be prescribed an alternative when possible   The patient has had a stroke and open heart surgery within this past year and is concerned with the potential side effects of this medication   Please contact further when possible      Reason for Disposition  [1] Caller has NON-URGENT medicine question about med that PCP prescribed AND [2] triager unable to answer question  Answer Assessment - Initial Assessment Questions 1. NAME of MEDICATION: "What medicine are you calling about?"     Gabapentin 2. QUESTION: "What is your question?" (e.g., double dose of medicine, side effect)     Worried about memory loss 3. PRESCRIBING HCP: "Who prescribed it?" Reason: if prescribed by specialist, call should be referred to that group.     Hospital at discharge 4. SYMPTOMS: "Do you have any symptoms?"     no 5. SEVERITY: If symptoms are present, ask "Are they mild, moderate or severe?"     na 6. PREGNANCY:  "Is there  any chance that you are pregnant?" "When was your last menstrual period?"     na  Protocols used: Medication Question Call-A-AH

## 2022-05-05 NOTE — Telephone Encounter (Signed)
Attempted to call patient to get more clarification. She did not answer and had no vm setup.

## 2022-05-05 NOTE — Telephone Encounter (Signed)
Is she having side effects? Those side effects usually only occur at much higher doses and are just as likely with any other medications for neuropathy.  I'm not sure what muscle relaxer she is talking about, but they only help with muscle pains and injuries, not with neuropathy.

## 2022-05-06 ENCOUNTER — Ambulatory Visit
Admission: RE | Admit: 2022-05-06 | Discharge: 2022-05-06 | Disposition: A | Discharge status: Home / Self Care | Payer: PPO | Source: Ambulatory Visit | Attending: Family Medicine | Admitting: Family Medicine

## 2022-05-06 DIAGNOSIS — E2839 Other primary ovarian failure: Secondary | ICD-10-CM | POA: Diagnosis not present

## 2022-05-06 DIAGNOSIS — Z780 Asymptomatic menopausal state: Secondary | ICD-10-CM | POA: Diagnosis not present

## 2022-05-07 ENCOUNTER — Other Ambulatory Visit (HOSPITAL_COMMUNITY): Payer: Self-pay

## 2022-05-07 ENCOUNTER — Other Ambulatory Visit: Payer: Self-pay | Admitting: Family Medicine

## 2022-05-07 ENCOUNTER — Telehealth: Payer: Self-pay

## 2022-05-07 DIAGNOSIS — F419 Anxiety disorder, unspecified: Secondary | ICD-10-CM

## 2022-05-07 MED ORDER — ALPRAZOLAM 0.5 MG PO TABS
ORAL_TABLET | ORAL | 2 refills | Status: DC
  Filled 2022-05-07 – 2022-05-15 (×2): qty 90, 45d supply, fill #0
  Filled 2022-07-01: qty 90, 45d supply, fill #1

## 2022-05-07 NOTE — Telephone Encounter (Signed)
Copied from [place]. Topic: General - Other >> May 07, 2022 10:56 AM Antonieta Iba C wrote: Reason for CRM: pt called in to update provider pt says that her pharmacy will be sending over a medication refill request. Pt would like to have a 90 day supply on her medications. Pt says that her insurance advised that it would be better financially.

## 2022-05-08 NOTE — Telephone Encounter (Signed)
Left second message. No VM

## 2022-05-13 NOTE — Telephone Encounter (Signed)
3rd attempt to reach pt.  Phone just rings. No VM

## 2022-05-15 ENCOUNTER — Other Ambulatory Visit (HOSPITAL_COMMUNITY): Payer: Self-pay

## 2022-05-19 ENCOUNTER — Ambulatory Visit: Payer: PPO | Attending: Family Medicine

## 2022-05-19 DIAGNOSIS — R278 Other lack of coordination: Secondary | ICD-10-CM | POA: Diagnosis not present

## 2022-05-19 DIAGNOSIS — M6281 Muscle weakness (generalized): Secondary | ICD-10-CM | POA: Insufficient documentation

## 2022-05-19 DIAGNOSIS — I6930 Unspecified sequelae of cerebral infarction: Secondary | ICD-10-CM | POA: Insufficient documentation

## 2022-05-19 DIAGNOSIS — R482 Apraxia: Secondary | ICD-10-CM | POA: Insufficient documentation

## 2022-05-19 DIAGNOSIS — I6381 Other cerebral infarction due to occlusion or stenosis of small artery: Secondary | ICD-10-CM | POA: Diagnosis not present

## 2022-05-20 NOTE — Therapy (Signed)
[place] MAIN [facility] SERVICES [address] [place], [place], [address] Phone: [phone]   Fax:  [phone]  Occupational Therapy Evaluation  Patient Details  Name: [name] MRN: [id number] Date of Birth: 06-03-1956 Referring Provider (OT): Frann Rider, NP   Encounter Date: 05/19/2022   OT End of Session - 05/20/22 1125     Visit Number 1    Number of Visits 24    Date for OT Re-Evaluation 08/10/22    OT Start Time 0915    OT Stop Time 1015    OT Time Calculation (min) 60 min    Equipment Utilized During Treatment transport cane, SBQC    Activity Tolerance Patient tolerated treatment well    Behavior During Therapy [facility] for tasks assessed/performed             Past Medical History:  Diagnosis Date   Anxiety    Arthritis    CAD (coronary artery disease)    a. 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 s/p CABG x4 LIMA to LAD, SVG to PDA, and Sequential SVG to OM and Diagonal on 8/   Stroke ([place]) 04/2021   admitted at [facility] - left sided weakness   Urinary incontinence    as stated by patient    Past Surgical History:  Procedure Laterality Date   AORTIC VALVE REPLACEMENT N/A 08/04/2021   Procedure: RESECTION AORTIC VALVE TUMOR;  Surgeon: [name], MD;  Location: [place];  Service: Open Heart Surgery;  Laterality: N/A;   APPLICATION OF WOUND VAC N/A 08/24/2021   Procedure:  APPLICATION OF WOUND VAC;  Surgeon: Gaye Pollack, MD;  Location: [place] [facility];  Service: Thoracic;  Laterality: N/A;   APPLICATION OF WOUND VAC N/A 08/28/2021   Procedure: WOUND VAC CHANGE;  Surgeon: [name], MD;  Location: [employer];  Service: Thoracic;  Laterality: N/A;   BREAST EXCISIONAL BIOPSY Right 1999   neg   BUBBLE STUDY  04/24/2021   Procedure: BUBBLE STUDY;  Surgeon: Sueanne Margarita, MD;  Location: [place];  Service: Cardiovascular;;   CATARACT EXTRACTION  12/2011   CATARACT EXTRACTION W/PHACO Left 12/02/2016   Procedure: CATARACT EXTRACTION PHACO AND INTRAOCULAR LENS PLACEMENT ([place]);  Surgeon: Estill Cotta, MD;  Location: [facility];  Service: Ophthalmology;  Laterality: Left;  [place] 2:14 AP% 28.4 CDE 59.73 Fluid pack lot # [id number]   COLONOSCOPY WITH PROPOFOL N/A 04/13/2022   Procedure: COLONOSCOPY WITH PROPOFOL;  Surgeon: Lin Landsman, MD;  Location: [facility] ENDOSCOPY;  Service: Gastroenterology;  Laterality: N/A;   CORONARY ARTERY BYPASS GRAFT N/A 08/04/2021   Procedure: CORONARY ARTERY BYPASS GRAFTING (CABG) X  4  USING LEFT INTERNAL MAMMARY ARTERY AND RIGHT GREATER SAPHENOUS VEIN ENDOSCOPIC CONDUITS;  Surgeon: [name], MD;  Location: [place];  Service: Open Heart Surgery;  Laterality: N/A;   DIAGNOSTIC LAPAROSCOPY     EYE SURGERY     IR CT HEAD LTD  04/19/2021   IR PERCUTANEOUS ART THROMBECTOMY/INFUSION INTRACRANIAL INC DIAG ANGIO  04/19/2021       IR PERCUTANEOUS ART THROMBECTOMY/INFUSION INTRACRANIAL INC DIAG ANGIO  04/19/2021   IR US GUIDE VASC ACCESS LEFT  04/19/2021   RADIOLOGY WITH ANESTHESIA N/A 04/19/2021   Procedure: IR WITH ANESTHESIA;  Surgeon: Luanne Bras, MD;  Location: [place];  Service: Radiology;  Laterality: N/A;   STERNAL WOUND DEBRIDEMENT N/A 08/24/2021   Procedure: STERNAL WOUND DRAINAGE AND DEBRIDEMENT;  Surgeon: Gaye Pollack, MD;  Location: [place] [facility];  Service: Thoracic;  Laterality: N/A;   STERNAL WOUND DEBRIDEMENT N/A  08/28/2021   Procedure: STERNAL WOUND DEBRIDEMENT;  Surgeon: [name], MD;  Location: [place];  Service: Thoracic;  Laterality: N/A;   TEE WITHOUT CARDIOVERSION N/A 04/24/2021   Procedure: TRANSESOPHAGEAL ECHOCARDIOGRAM (TEE);  Surgeon: Sueanne Margarita, MD;  Location: [facility] ENDOSCOPY;  Service: Cardiovascular;  Laterality: N/A;   TEE WITHOUT CARDIOVERSION N/A 08/04/2021   Procedure: TRANSESOPHAGEAL ECHOCARDIOGRAM (TEE);  Surgeon: [name], MD;  Location: [place];  Service: Open Heart Surgery;  Laterality: N/A;   TOTAL HIP ARTHROPLASTY Right 04/04/2018   Procedure: TOTAL HIP ARTHROPLASTY;  Surgeon: Dereck Leep, MD;  Location: [facility];  Service: Orthopedics;  Laterality: Right;    There were no vitals filed for this visit.   Subjective Assessment - 05/20/22 1109     Subjective  Pt states she wants to get her arm working better and get back to working.    Pertinent History Pt s/p R CVA on 04/18/21.  Pt was initially recovering very well from a stroke standpoint with mild LUE >LLE numbness and ambulating with a cane. 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  Therapy eval's recommended CIR for decreased functional mobility - d/c to CIR on 8/29.  She required incision debridement of sternal wound and application of VAC eventually discontinued on 9/29.  She was discharged home on 9/30 (after a 32 day stay).    Limitations Lack of coordination throughout LUE    Patient Stated Goals Pt states she wants to get her arm working better and get back to working.    Currently in Pain? Yes    Pain Score 5     Pain Location Finger (Comment which one)    Pain Orientation Left    Pain Descriptors / Indicators Aching;Tightness;Sharp    Pain Type Chronic pain    Pain Radiating Towards 3rd digit PIP triggering    Pain Onset More  than a month ago    Pain Frequency Intermittent    Aggravating Factors  repetitive flexion of L hand digits    Pain Relieving Factors rest    Effect of Pain on Daily Activities 3rd digit PIP triggering with grasping activities    Multiple Pain Sites No               [facility] OT Assessment - 05/20/22 0001       Assessment   Medical Diagnosis CVA    Referring Provider (OT) Frann Rider, NP    Onset Date/Surgical Date 04/18/21    Hand Dominance Right    Next MD Visit next month with neurologist    Prior Therapy  CIR, home health      Balance Screen   Has the patient fallen in the past 6 months No    Has the patient had a decrease in activity level because of a fear of falling?  No    Is the patient reluctant to leave their home because of a fear of falling?  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  Functional transfers in the home with modified indep.      IADL   Meal Prep Plans, prepares and serves adequate meals independently   light meals like frying an egg on the stove or  microwaveable meals   Medication Management Is responsible for taking medication in correct dosages at correct time      Written Expression   Dominant Hand Right      Vision - History   Baseline Vision Wears glasses only for reading    Additional Comments no change from baseline following CVA      Cognition   Overall Cognitive Status Within Functional Limits for tasks assessed      Observation/Other Assessments   Focus on Therapeutic Outcomes (FOTO)  60      Sensation   Light Touch Impaired by gross assessment   reports a numbness/cold feeling when touched starting at L upper arm down to hand     Coordination   Gross Motor Movements are Fluid and Coordinated No    Fine Motor Movements are Fluid and Coordinated No    Finger Nose Finger Test ataxic    Right 9 Hole Peg Test 28 sec    Left 9 Hole Peg Test 5 min 38 sec      AROM   Overall AROM Comments BUEs WNL      Strength   Overall Strength Comments BUEs grossly 4+/5      Hand Function   Right Hand Grip (lbs) 35    Right Hand Lateral Pinch 10 lbs    Right Hand 3 Point Pinch 7 lbs    Left Hand Grip (lbs) 17    Left Hand Lateral Pinch 6 lbs    Left 3 point pinch 7 lbs            Occupational Therapy Evaluation: Pt is a 65 y/o female s/p CVA on 5/6/222.  Pt presents with late effects CVA, with moderate-severe  ataxia throughout LUE.  Pt also presents with triggering of L 3rd digit PIP joint with repetitive flexion, reporting pain in this joint at 5/10.  Made recommendation for trigger finger splint and options to obtain, to which pt was receptive.  Lack of coordination throughout LUE significantly limiting engagement of LUE use in daily tasks.  Pt will benefit from skilled OT to address above noted deficits, working towards decrease in L hand pain, increased coordination throughout LUE, increased strength in L hand to increase use of LUE for daily tasks.  Pt in agreement with OT poc.     OT Education - 05/20/22 1125      Education Details Role of OT, goals, poc, options to obtain trigger finger splint    Person(s) Educated Patient    Methods Explanation;Verbal cues    Comprehension Verbalized understanding;Verbal cues required;Need further instruction              OT Short Term Goals - 05/20/22 1129       OT SHORT TERM GOAL #1   Title Pt will be indep to perform LUE HEP for coordination and strengthening.    Baseline Eval: not yet initiated    Time 6    Period Weeks    Status New    Target Date 06/29/22      OT SHORT TERM GOAL #2   Title Pt be indep with joint protection and pain management strategies to limit triggering of L hand 3rd digit PIP.    Baseline Eval: Trigger finger splint recommended, initiated joint protection educ    Time 6    Period Weeks    Status New    Target Date 06/29/22               OT Long Term Goals - 05/20/22 1131       OT LONG TERM GOAL #1   Title Pt will increase FOTO by 1-2 points to indicate improvement in perceived functional indep with daily tasks.    Baseline Eval: FOTO 60    Time 12    Period Weeks    Status New    Target Date 08/10/22      OT LONG TERM GOAL #2   Title Pt will demonstrate increased L FMC/GMC to enable indep with putting hair in ponytail.    Baseline Eval: unable    Time 12    Period Weeks    Status New    Target Date 08/10/22      OT LONG TERM GOAL #3   Title Pt will increase L FMC to enable indep with tying shoe laces.    Baseline Eval: unable to tie laces; wears slip on shoes    Time 12    Period Weeks    Status New    Target Date 08/10/22      OT LONG TERM GOAL #4   Title Pt will increase L hand dexterity skills to enable pt to pick up coins from table with L hand.    Baseline Eval: unable    Time 12    Period Weeks    Status New    Target Date 08/10/22      OT LONG TERM GOAL #5   Title Pt will increase L grip strength by 5 or more lbs to improve ability to hold and carry ADL supplies in L hand.    Baseline  Eval: L grip 17# (R 35#)    Time 12    Period Weeks  Status New    Target Date 08/10/22               Plan - 05/20/22 1126     Clinical Impression Statement Pt is a 66 y/o female s/p CVA on 5/6/222.  Pt presents with late effects CVA, with moderate-severe ataxia throughout LUE.  Pt also presents with triggering of L 3rd digit PIP joint with repetitive flexion, reporting pain in this joint at 5/10.  Made recommendation for trigger finger splint and options to obtain, to which pt was receptive.  Lack of coordination throughout LUE significantly limiting engagement of LUE use in daily tasks.  Pt will benefit from skilled OT to address above noted deficits, working towards decrease in L hand pain, increased coordination throughout LUE, increased strength in L hand to increase use of LUE for daily tasks.  Pt in agreement with OT poc.    OT Occupational Profile and History Problem Focused Assessment - Including review of records relating to presenting problem    Occupational performance deficits (Please refer to evaluation for details): ADL's;IADL's;Work    Body Structure / Function / Physical Skills ADL;GMC;UE functional use;FMC;Decreased knowledge of use of DME;Pain;Strength;Coordination;Dexterity;IADL;Sensation;Balance    Rehab Potential Good    Clinical Decision Making Limited treatment options, no task modification necessary    Comorbidities Affecting Occupational Performance: May have comorbidities impacting occupational performance    Modification or Assistance to Complete Evaluation  No modification of tasks or assist necessary to complete eval    OT Frequency 2x / week    OT Duration 12 weeks    OT Treatment/Interventions Self-care/ADL training;Therapeutic exercise;Therapeutic activities;Cryotherapy;Moist Heat;Neuromuscular education;DME and/or AE instruction;Manual Therapy;Splinting;Patient/family education    Plan OT focus on LUE coordination/strength training and education/splinting  for L hand 3rd digit PIP triggering    OT Home Exercise Plan Not yet initiated    Consulted and Agree with Plan of Care Patient             Patient will benefit from skilled therapeutic intervention in order to improve the following deficits and impairments:   Body Structure / Function / Physical Skills: ADL, GMC, UE functional use, FMC, Decreased knowledge of use of DME, Pain, Strength, Coordination, Dexterity, IADL, Sensation, Balance       Visit Diagnosis: Muscle weakness (generalized)  Other lack of coordination  Right thalamic infarction [facility])    Problem List Patient Active Problem List   Diagnosis Date Noted   Colon cancer screening    Subcutaneous nodule    Acute blood loss anemia    Diabetic peripheral neuropathy ([place])    Debility 08/11/2021   S/P CABG x 4 08/04/2021   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   Hyperkalemia    Chronic bilateral low back pain without sciatica    Leukocytosis    Hyponatremia    Essential hypertension    Slow transit constipation    Hemiparesis affecting left side as late effect of stroke ([facility])    Aortic valve mass    History of CVA with residual deficit 04/19/2021   Dyslipidemia, goal LDL below 70 04/18/2021   Hypertensive urgency 04/18/2021   Recurrent major depressive disorder, in partial remission ([place]) 09/20/2020   Status post total replacement of hip 04/04/2018   Primary osteoarthritis of right hip 02/20/2018   Allergic rhinitis 10/28/2015   Acute stress disorder 08/01/2015   Anxiety 08/01/2015   Clinical depression 08/01/2015   Diabetes ([place]) 08/01/2015   Diverticulosis of colon 08/01/2015   Generalized pruritus 08/01/2015  BP (high blood pressure) 08/01/2015   Cannot sleep 08/01/2015   Adiposity 08/01/2015   Detrusor muscle hypertonia 08/01/2015   Hypercholesterolemia without hypertriglyceridemia 08/01/2015   Female stress incontinence 08/01/2015   Avitaminosis D 08/01/2015   Leta Speller, MS,  OTR/L  Darleene Cleaver, OT 05/20/2022, 11:55 AM  [place] MAIN [facility] SERVICES [address] [place], [place], [address] Phone: [phone]   Fax:  [phone]  Name: BETTYJANE SHENOY MRN: [id number] Date of Birth: November 18, 1956

## 2022-05-20 NOTE — Addendum Note (Signed)
Addended by: Darleene Cleaver on: 05/20/2022 11:57 AM   Modules accepted: Orders

## 2022-05-26 ENCOUNTER — Ambulatory Visit: Payer: PPO

## 2022-05-26 DIAGNOSIS — M6281 Muscle weakness (generalized): Secondary | ICD-10-CM | POA: Diagnosis not present

## 2022-05-26 DIAGNOSIS — R278 Other lack of coordination: Secondary | ICD-10-CM

## 2022-05-26 DIAGNOSIS — I6381 Other cerebral infarction due to occlusion or stenosis of small artery: Secondary | ICD-10-CM

## 2022-05-27 NOTE — Therapy (Signed)
[place] MAIN [facility] SERVICES [address] [place], [place], [address] Phone: [phone]   Fax:  [phone]  Occupational Therapy Treatment  Patient Details  Name: [name] MRN: [id number] Date of Birth: January 13, 1956 Referring Provider (OT): Frann Rider, NP   Encounter Date: 05/26/2022   OT End of Session - 05/26/22 1119     Visit Number 2    Number of Visits 24    Date for OT Re-Evaluation 08/10/22    OT Start Time 13    OT Stop Time 1145    OT Time Calculation (min) 45 min    Equipment Utilized During Treatment transport cane, SBQC    Activity Tolerance Patient tolerated treatment well    Behavior During Therapy [facility] for tasks assessed/performed             Past Medical History:  Diagnosis Date   Anxiety    Arthritis    CAD (coronary artery disease)    a. 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 s/p CABG x4 LIMA to LAD, SVG to PDA, and Sequential SVG to OM and Diagonal on 8/   Stroke ([place]) 04/2021   admitted at [facility] - left sided weakness   Urinary incontinence    as stated by patient    Past Surgical History:  Procedure Laterality Date   AORTIC VALVE REPLACEMENT N/A 08/04/2021   Procedure: RESECTION AORTIC VALVE TUMOR;  Surgeon: [name], MD;  Location: [place];  Service: Open Heart Surgery;  Laterality: N/A;   APPLICATION OF WOUND VAC N/A 08/24/2021   Procedure:  APPLICATION OF WOUND VAC;  Surgeon: Gaye Pollack, MD;  Location: [place] OR;  Service: Thoracic;  Laterality: N/A;   APPLICATION OF WOUND VAC N/A 08/28/2021   Procedure: WOUND VAC CHANGE;  Surgeon: [name], MD;  Location: [place];  Service: Thoracic;  Laterality: N/A;   BREAST EXCISIONAL BIOPSY Right 1999   neg   BUBBLE STUDY  04/24/2021   Procedure: BUBBLE STUDY;  Surgeon: Sueanne Margarita, MD;  Location: [place];  Service: Cardiovascular;;   CATARACT EXTRACTION  12/2011   CATARACT EXTRACTION W/PHACO Left 12/02/2016   Procedure: CATARACT EXTRACTION PHACO AND INTRAOCULAR LENS PLACEMENT ([place]);  Surgeon: Estill Cotta, MD;  Location: [facility];  Service: Ophthalmology;  Laterality: Left;  [place] 2:14 AP% 28.4 CDE 59.73 Fluid pack lot # [id number]   COLONOSCOPY WITH PROPOFOL N/A 04/13/2022   Procedure: COLONOSCOPY WITH PROPOFOL;  Surgeon: Lin Landsman, MD;  Location: [facility] ENDOSCOPY;  Service: Gastroenterology;  Laterality: N/A;   CORONARY ARTERY BYPASS GRAFT N/A 08/04/2021   Procedure: CORONARY ARTERY BYPASS GRAFTING (CABG) X  4  USING LEFT INTERNAL MAMMARY ARTERY AND RIGHT GREATER SAPHENOUS VEIN ENDOSCOPIC CONDUITS;  Surgeon: [name], MD;  Location: [place];  Service: Open Heart Surgery;  Laterality: N/A;   DIAGNOSTIC LAPAROSCOPY     EYE SURGERY     IR CT HEAD LTD  04/19/2021   IR PERCUTANEOUS ART THROMBECTOMY/INFUSION INTRACRANIAL INC DIAG ANGIO  04/19/2021       IR PERCUTANEOUS ART THROMBECTOMY/INFUSION INTRACRANIAL INC DIAG ANGIO  04/19/2021   IR US GUIDE VASC ACCESS LEFT  04/19/2021   RADIOLOGY WITH ANESTHESIA N/A 04/19/2021   Procedure: IR WITH ANESTHESIA;  Surgeon: Luanne Bras, MD;  Location: [place];  Service: Radiology;  Laterality: N/A;   STERNAL WOUND DEBRIDEMENT N/A 08/24/2021   Procedure: STERNAL WOUND DRAINAGE AND DEBRIDEMENT;  Surgeon: Gaye Pollack, MD;  Location: [place] [facility];  Service: Thoracic;  Laterality: N/A;   STERNAL WOUND DEBRIDEMENT N/A  08/28/2021   Procedure: STERNAL WOUND DEBRIDEMENT;  Surgeon: [name], MD;  Location: [place];  Service: Thoracic;  Laterality: N/A;   TEE WITHOUT CARDIOVERSION N/A 04/24/2021   Procedure: TRANSESOPHAGEAL ECHOCARDIOGRAM (TEE);  Surgeon: Sueanne Margarita, MD;  Location: [facility] ENDOSCOPY;  Service: Cardiovascular;  Laterality: N/A;   TEE WITHOUT CARDIOVERSION N/A 08/04/2021   Procedure: TRANSESOPHAGEAL ECHOCARDIOGRAM (TEE);  Surgeon: [name], MD;  Location: [place];  Service: Open Heart Surgery;  Laterality: N/A;   TOTAL HIP ARTHROPLASTY Right 04/04/2018   Procedure: TOTAL HIP ARTHROPLASTY;  Surgeon: Dereck Leep, MD;  Location: [facility];  Service: Orthopedics;  Laterality: Right;    There were no vitals filed for this visit.   Subjective Assessment - 05/26/22 1110     Subjective  I have to get this arm and hand working.    Pertinent History Pt s/p R CVA on 04/18/21.  Pt was initially recovering very well from a stroke standpoint with mild LUE >LLE numbness and ambulating with a cane. 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  Therapy eval's recommended CIR for decreased functional mobility - d/c to CIR on 8/29.  She required incision debridement of sternal wound and application of VAC eventually discontinued on 9/29.  She was discharged home on 9/30 (after a 32 day stay).    Limitations Lack of coordination throughout LUE    Patient Stated Goals Pt states she wants to get her arm working better and get back to working.    Currently in Pain? Yes    Pain Score 5     Pain Location Finger (Comment which one)    Pain Orientation Left   L 3rd digit PIP   Pain Descriptors / Indicators Aching;Tightness;Sharp    Pain Type Chronic pain    Pain Radiating Towards 3rd digit PIP triggering    Pain Onset More than a month ago     Aggravating Factors  repetitive flexion of L hand digits    Pain Relieving Factors rest    Effect of Pain on Daily Activities 3rd digit PIP triggering with grasping activities    Multiple Pain Sites No            Occupational Therapy Treatment: Paraffin: 10 min To L hand for pain management/muscle relaxation in prep for neuro re-ed.  Neuro re-ed: Facilitated GMC/FMC for LUE working to pick up washers from magnetic dish and place over vertical dowels.  OT adjusted placement of dowels to promote multiplane reaching.  OT provided cues for lateral and 2 point/3 point pinch patterns.  Attempted pick up of  washers from table but pt required non-skid surface and towel was placed for easier pick up.  Practiced picking up washers, storing multiple in hand, and discarding 1 at a time.  Encouraged pt complete this activity with coins at home.  In standing, practiced picking up small magnets and placing them on a target circle on white board.    Response to Treatment: Pt with good tolerance to paraffin for L hand pain and reported good improvement in pain following tx.  OT reinforced joint protection strategies to minimize triggering in L 3rd digit PIP.  Pt states that her son is going to order her a trigger finger splint as recommended by OT.  Pt tolerated neuro re-ed activities well.  Pt has difficulty picking up washers from table top without non-skid surface, or will otherwise slide them to the edge of the table.  Pt frequently drops washers, requiring multiple attempts at pick up.  When reaching toward a target, pt tends to stabilize hand on the target (white board or dowel) to minimize ataxia and improve accuracy with her reaching.  Pt will continue to benefit from skilled OT to address L hand pain and lack of coordination throughout LUE in order to maximize efficient use of LUE for daily tasks.      OT Education - 05/26/22 1118     Education Details LUE GMC/FMC activities for HEP    Person(s)  Educated Patient    Methods Explanation;Verbal cues    Comprehension Verbalized understanding;Verbal cues required;Need further instruction              OT Short Term Goals - 05/20/22 1129       OT SHORT TERM GOAL #1   Title Pt will be indep to perform LUE HEP for coordination and strengthening.    Baseline Eval: not yet initiated    Time 6    Period Weeks    Status New    Target Date 06/29/22      OT SHORT TERM GOAL #2   Title Pt be indep with joint protection and pain management strategies to limit triggering of L hand 3rd digit PIP.    Baseline Eval: Trigger finger splint recommended, initiated joint protection educ    Time 6    Period Weeks    Status New    Target Date 06/29/22               OT Long Term Goals - 05/20/22 1131       OT LONG TERM GOAL #1   Title Pt will increase FOTO by 1-2 points to indicate improvement in perceived functional indep with daily tasks.    Baseline Eval: FOTO 60    Time 12    Period Weeks    Status New    Target Date 08/10/22      OT LONG TERM GOAL #2   Title Pt will demonstrate increased L FMC/GMC to enable indep with putting hair in ponytail.    Baseline Eval: unable    Time 12    Period Weeks    Status New    Target Date 08/10/22      OT LONG TERM GOAL #3   Title Pt will increase L FMC to enable indep with tying shoe laces.    Baseline Eval: unable to tie laces; wears slip on shoes    Time 12    Period Weeks    Status New    Target Date 08/10/22      OT  LONG TERM GOAL #4   Title Pt will increase L hand dexterity skills to enable pt to pick up coins from table with L hand.    Baseline Eval: unable    Time 12    Period Weeks    Status New    Target Date 08/10/22      OT LONG TERM GOAL #5   Title Pt will increase L grip strength by 5 or more lbs to improve ability to hold and carry ADL supplies in L hand.    Baseline Eval: L grip 17# (R 35#)    Time 12    Period Weeks    Status New    Target Date 08/10/22              Plan - 05/26/22 0857     Clinical Impression Statement Pt with good tolerance to paraffin for L hand pain and reported good improvement in pain following tx.  OT reinforced joint protection strategies to minimize triggering in L 3rd digit PIP.  Pt states that her son is going to order her a trigger finger splint as recommended by OT.  Pt tolerated neuro re-ed activities well.  Pt has difficulty picking up washers from table top without non-skid surface, or will otherwise slide them to the edge of the table.  Pt frequently drops washers, requiring multiple attempts at pick up.  When reaching toward a target, pt tends to stabilize hand on the target (white board or dowel) to minimize ataxia and improve accuracy with her reaching.  Pt will continue to benefit from skilled OT to address L hand pain and lack of coordination throughout LUE in order to maximize efficient use of LUE for daily tasks.    OT Occupational Profile and History Problem Focused Assessment - Including review of records relating to presenting problem    Occupational performance deficits (Please refer to evaluation for details): ADL's;IADL's;Work    Body Structure / Function / Physical Skills ADL;GMC;UE functional use;FMC;Decreased knowledge of use of DME;Pain;Strength;Coordination;Dexterity;IADL;Sensation;Balance    Rehab Potential Good    Clinical Decision Making Limited treatment options, no task modification necessary    Comorbidities Affecting Occupational Performance: May have comorbidities impacting occupational performance    Modification or Assistance to Complete Evaluation  No modification of tasks or assist necessary to complete eval    OT Frequency 2x / week    OT Duration 12 weeks    OT Treatment/Interventions Self-care/ADL training;Therapeutic exercise;Therapeutic activities;Cryotherapy;Moist Heat;Neuromuscular education;DME and/or AE instruction;Manual Therapy;Splinting;Patient/family education    Plan OT  focus on LUE coordination/strength training and education/splinting for L hand 3rd digit PIP triggering    OT Home Exercise Plan Not yet initiated    Consulted and Agree with Plan of Care Patient             Patient will benefit from skilled therapeutic intervention in order to improve the following deficits and impairments:   Body Structure / Function / Physical Skills: ADL, GMC, UE functional use, FMC, Decreased knowledge of use of DME, Pain, Strength, Coordination, Dexterity, IADL, Sensation, Balance       Visit Diagnosis: Muscle weakness (generalized)  Other lack of coordination  Right thalamic infarction [facility])    Problem List Patient Active Problem List   Diagnosis Date Noted   Colon cancer screening    Subcutaneous nodule    Acute blood loss anemia    Diabetic peripheral neuropathy ([place])    Debility 08/11/2021   S/P CABG x 4 08/04/2021   Papillary  fibroelastoma of heart 07/09/2021   Hyperkalemia    Chronic bilateral low back pain without sciatica    Leukocytosis    Hyponatremia    Essential hypertension    Slow transit constipation    Hemiparesis affecting left side as late effect of stroke ([place])    Aortic valve mass    History of CVA with residual deficit 04/19/2021   Dyslipidemia, goal LDL below 70 04/18/2021   Hypertensive urgency 04/18/2021   Recurrent major depressive disorder, in partial remission ([place]) 09/20/2020   Status post total replacement of hip 04/04/2018   Primary osteoarthritis of right hip 02/20/2018   Allergic rhinitis 10/28/2015   Acute stress disorder 08/01/2015   Anxiety 08/01/2015   Clinical depression 08/01/2015   Diabetes ([place]) 08/01/2015   Diverticulosis of colon 08/01/2015   Generalized pruritus 08/01/2015   BP (high blood pressure) 08/01/2015   Cannot sleep 08/01/2015   Adiposity 08/01/2015   Detrusor muscle hypertonia 08/01/2015   Hypercholesterolemia without hypertriglyceridemia 08/01/2015   Female stress incontinence  08/01/2015   Avitaminosis D 08/01/2015   Leta Speller, MS, OTR/L  Darleene Cleaver, OT 05/27/2022, 8:58 AM  [place] MAIN [facility] SERVICES [address] [place], [place], [address] Phone: [phone]   Fax:  [phone]  Name: ANSHI JALLOH MRN: [id number] Date of Birth: [birth year]

## 2022-05-28 ENCOUNTER — Ambulatory Visit: Payer: PPO

## 2022-05-28 DIAGNOSIS — R278 Other lack of coordination: Secondary | ICD-10-CM

## 2022-05-28 DIAGNOSIS — M6281 Muscle weakness (generalized): Secondary | ICD-10-CM

## 2022-05-28 DIAGNOSIS — I6381 Other cerebral infarction due to occlusion or stenosis of small artery: Secondary | ICD-10-CM

## 2022-05-29 NOTE — Therapy (Signed)
[place] MAIN [facility] SERVICES [address] [place], [place], [address] Phone: [phone]   Fax:  [phone]  Occupational Therapy Treatment  Patient Details  Name: [name] MRN: [id number] Date of Birth: 05/14/1956 Referring Provider (OT): Frann Rider, NP   Encounter Date: 05/28/2022   OT End of Session - 05/29/22 1311     Visit Number 3    Number of Visits 24    Date for OT Re-Evaluation 08/10/22    OT Start Time 73    OT Stop Time 1145    OT Time Calculation (min) 45 min    Equipment Utilized During Treatment transport cane, SBQC    Activity Tolerance Patient tolerated treatment well    Behavior During Therapy [facility] for tasks assessed/performed             Past Medical History:  Diagnosis Date   Anxiety    Arthritis    CAD (coronary artery disease)    a. 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 s/p CABG x4 LIMA to LAD, SVG to PDA, and Sequential SVG to OM and Diagonal on 8/   Stroke ([place]) 04/2021   admitted at [facility] - left sided weakness   Urinary incontinence    as stated by patient    Past Surgical History:  Procedure Laterality Date   AORTIC VALVE REPLACEMENT N/A 08/04/2021   Procedure: RESECTION AORTIC VALVE TUMOR;  Surgeon: [name], MD;  Location: [place];  Service: Open Heart Surgery;  Laterality: N/A;   APPLICATION OF WOUND VAC N/A 08/24/2021   Procedure:  APPLICATION OF WOUND VAC;  Surgeon: Gaye Pollack, MD;  Location: [place] OR;  Service: Thoracic;  Laterality: N/A;   APPLICATION OF WOUND VAC N/A 08/28/2021   Procedure: WOUND VAC CHANGE;  Surgeon: [name], MD;  Location: [place];  Service: Thoracic;  Laterality: N/A;   BREAST EXCISIONAL BIOPSY Right 1999   neg   BUBBLE STUDY  04/24/2021   Procedure: BUBBLE STUDY;  Surgeon: Sueanne Margarita, MD;  Location: [place];  Service: Cardiovascular;;   CATARACT EXTRACTION  12/2011   CATARACT EXTRACTION W/PHACO Left 12/02/2016   Procedure: CATARACT EXTRACTION PHACO AND INTRAOCULAR LENS PLACEMENT ([place]);  Surgeon: Estill Cotta, MD;  Location: [facility];  Service: Ophthalmology;  Laterality: Left;  [place] 2:14 AP% 28.4 CDE 59.73 Fluid pack lot # [id number]   COLONOSCOPY WITH PROPOFOL N/A 04/13/2022   Procedure: COLONOSCOPY WITH PROPOFOL;  Surgeon: Lin Landsman, MD;  Location: [facility] ENDOSCOPY;  Service: Gastroenterology;  Laterality: N/A;   CORONARY ARTERY BYPASS GRAFT N/A 08/04/2021   Procedure: CORONARY ARTERY BYPASS GRAFTING (CABG) X  4  USING LEFT INTERNAL MAMMARY ARTERY AND RIGHT GREATER SAPHENOUS VEIN ENDOSCOPIC CONDUITS;  Surgeon: [name], MD;  Location: [place];  Service: Open Heart Surgery;  Laterality: N/A;   DIAGNOSTIC LAPAROSCOPY     EYE SURGERY     IR CT HEAD LTD  04/19/2021   IR PERCUTANEOUS ART THROMBECTOMY/INFUSION INTRACRANIAL INC DIAG ANGIO  04/19/2021       IR PERCUTANEOUS ART THROMBECTOMY/INFUSION INTRACRANIAL INC DIAG ANGIO  04/19/2021   IR US GUIDE VASC ACCESS LEFT  04/19/2021   RADIOLOGY WITH ANESTHESIA N/A 04/19/2021   Procedure: IR WITH ANESTHESIA;  Surgeon: Luanne Bras, MD;  Location: [place];  Service: Radiology;  Laterality: N/A;   STERNAL WOUND DEBRIDEMENT N/A 08/24/2021   Procedure: STERNAL WOUND DRAINAGE AND DEBRIDEMENT;  Surgeon: Gaye Pollack, MD;  Location: [place] OR;  Service: Thoracic;  Laterality: N/A;   STERNAL WOUND DEBRIDEMENT N/A  08/28/2021   Procedure: STERNAL WOUND DEBRIDEMENT;  Surgeon: [name], MD;  Location: [place];  Service: Thoracic;  Laterality: N/A;   TEE WITHOUT CARDIOVERSION N/A 04/24/2021   Procedure: TRANSESOPHAGEAL ECHOCARDIOGRAM (TEE);  Surgeon: Sueanne Margarita, MD;  Location: [facility] ENDOSCOPY;  Service: Cardiovascular;  Laterality: N/A;   TEE WITHOUT CARDIOVERSION N/A 08/04/2021   Procedure: TRANSESOPHAGEAL ECHOCARDIOGRAM (TEE);  Surgeon: [name], MD;  Location: [place];  Service: Open Heart Surgery;  Laterality: N/A;   TOTAL HIP ARTHROPLASTY Right 04/04/2018   Procedure: TOTAL HIP ARTHROPLASTY;  Surgeon: Dereck Leep, MD;  Location: [facility];  Service: Orthopedics;  Laterality: Right;    There were no vitals filed for this visit.   Subjective Assessment - 05/28/22 1310     Subjective  Pt reports she's been working hard on her exercises at home.    Pertinent History Pt s/p R CVA on 04/18/21.  Pt was initially recovering very well from a stroke standpoint with mild LUE >LLE numbness and ambulating with a cane. 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  Therapy eval's recommended CIR for decreased functional mobility - d/c to CIR on 8/29.  She required incision debridement of sternal wound and application of VAC eventually discontinued on 9/29.  She was discharged home on 9/30 (after a 32 day stay).    Limitations Lack of coordination throughout LUE    Patient Stated Goals Pt states she wants to get her arm working better and get back to working.    Currently in Pain? Yes    Pain Score 4     Pain Location Finger (Comment which one)    Pain Orientation Left    Pain Descriptors / Indicators Aching;Sharp;Tightness    Pain Type Chronic pain    Pain Radiating Towards 3rd digit PIP triggering    Pain Onset More than a month ago     Pain Frequency Intermittent    Aggravating Factors  repetitive flexion of L hand digits    Pain Relieving Factors rest    Effect of Pain on Daily Activities triggering in this digit with grasping activities    Multiple Pain Sites No            Occupational Therapy Treatment: Paraffin: 10 min To L hand for pain management/muscle relaxation in prep for neuro re-ed.   Neuro re-ed: Facilitated GMC/FMC for LUE working to pick up short jumbo pegs and place them into peg board set on an incline at table top level.  Pt was cued to limit use of table or pegboard to reposition peg within  hand and encouraged taking increased time to reposition within fingertips.  Pt practiced picking up, storing, and discarding small stones in L hand.  Practiced pick up from table top without non-skid surface.   Response to Treatment: Pt with good tolerance to paraffin for L hand pain and reported good improvement in pain following tx.  Pt plans to have son order her a paraffin wax home unit as she feels good pain relief from this tx.  Pt was able to obtain trigger finger splint for L hand 3rd digit and states she's been wearing it at home.  Pt continues to develop LUE FMC/GMC skills.  Pt did well taking extra time to reposition small items within fingertips rather than compensating by placing an item back on the table to pick it up at a different angle.  Pt had many dropped pegs, but when cued to slow pace and think about sequencing a slower grasp/reach/release sequence she performed with fewer dropped pegs.  Moderate/severe ataxia noted when reaching toward a target with an outstretched arm.  Pt will continue to benefit from skilled OT to address L hand pain and lack of coordination throughout LUE in order to maximize efficient use of LUE for daily tasks.     OT Education - 05/28/22 1311     Education Details HEP    Person(s) Educated Patient    Methods Explanation;Verbal cues    Comprehension Verbalized  understanding;Verbal cues required;Need further instruction              OT Short Term Goals - 05/20/22 1129       OT SHORT TERM GOAL #1   Title Pt will be indep to perform LUE HEP for coordination and strengthening.    Baseline Eval: not yet initiated    Time 6    Period Weeks    Status New    Target Date 06/29/22      OT SHORT TERM GOAL #2   Title Pt be indep with joint protection and pain management strategies to limit triggering of L hand 3rd digit PIP.    Baseline Eval: Trigger finger splint recommended, initiated joint protection educ    Time 6    Period Weeks    Status New    Target Date 06/29/22               OT Long Term Goals - 05/20/22 1131       OT LONG TERM GOAL #1   Title Pt will increase FOTO by 1-2 points to indicate improvement in perceived functional indep with daily tasks.    Baseline Eval: FOTO 60    Time 12    Period Weeks    Status New    Target Date 08/10/22      OT LONG TERM GOAL #2   Title Pt will demonstrate increased L FMC/GMC to enable indep with putting hair in ponytail.    Baseline Eval: unable    Time 12    Period Weeks    Status New    Target Date 08/10/22      OT LONG TERM GOAL #3   Title Pt will increase L FMC to enable indep with tying shoe laces.    Baseline Eval: unable to tie laces; wears slip on shoes    Time 12    Period Weeks    Status New    Target Date 08/10/22      OT LONG TERM GOAL #4   Title Pt will increase L  hand dexterity skills to enable pt to pick up coins from table with L hand.    Baseline Eval: unable    Time 12    Period Weeks    Status New    Target Date 08/10/22      OT LONG TERM GOAL #5   Title Pt will increase L grip strength by 5 or more lbs to improve ability to hold and carry ADL supplies in L hand.    Baseline Eval: L grip 17# (R 35#)    Time 12    Period Weeks    Status New    Target Date 08/10/22                   Plan - 05/28/22 1321     Clinical Impression  Statement Pt with good tolerance to paraffin for L hand pain and reported good improvement in pain following tx.  Pt plans to have son order her a paraffin wax home unit as she feels good pain relief from this tx.  Pt was able to obtain trigger finger splint for L hand 3rd digit and states she's been wearing it at home.  Pt continues to develop LUE FMC/GMC skills.  Pt did well taking extra time to reposition small items within fingertips rather than compensating by placing an item back on the table to pick it up at a different angle.  Pt had many dropped pegs, but when cued to slow pace and think about sequencing a slower grasp/reach/release sequence she performed with fewer dropped pegs.  Moderate/severe ataxia noted when reaching toward a target with an outstretched arm.  Pt will continue to benefit from skilled OT to address L hand pain and lack of coordination throughout LUE in order to maximize efficient use of LUE for daily tasks.    OT Occupational Profile and History Problem Focused Assessment - Including review of records relating to presenting problem    Occupational performance deficits (Please refer to evaluation for details): ADL's;IADL's;Work    Body Structure / Function / Physical Skills ADL;GMC;UE functional use;FMC;Decreased knowledge of use of DME;Pain;Strength;Coordination;Dexterity;IADL;Sensation;Balance    Rehab Potential Good    Clinical Decision Making Limited treatment options, no task modification necessary    Comorbidities Affecting Occupational Performance: May have comorbidities impacting occupational performance    Modification or Assistance to Complete Evaluation  No modification of tasks or assist necessary to complete eval    OT Frequency 2x / week    OT Duration 12 weeks    OT Treatment/Interventions Self-care/ADL training;Therapeutic exercise;Therapeutic activities;Cryotherapy;Moist Heat;Neuromuscular education;DME and/or AE instruction;Manual  Therapy;Splinting;Patient/family education    Plan OT focus on LUE coordination/strength training and education/splinting for L hand 3rd digit PIP triggering    OT Home Exercise Plan Not yet initiated    Consulted and Agree with Plan of Care Patient             Patient will benefit from skilled therapeutic intervention in order to improve the following deficits and impairments:   Body Structure / Function / Physical Skills: ADL, GMC, UE functional use, FMC, Decreased knowledge of use of DME, Pain, Strength, Coordination, Dexterity, IADL, Sensation, Balance       Visit Diagnosis: Muscle weakness (generalized)  Other lack of coordination  Right thalamic infarction [facility])    Problem List Patient Active Problem List   Diagnosis Date Noted   Colon cancer screening    Subcutaneous nodule    Acute blood loss anemia    Diabetic peripheral neuropathy (  [place])    Debility 08/11/2021   S/P CABG x 4 08/04/2021   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   Hyperkalemia    Chronic bilateral low back pain without sciatica    Leukocytosis    Hyponatremia    Essential hypertension    Slow transit constipation    Hemiparesis affecting left side as late effect of stroke ([place])    Aortic valve mass    History of CVA with residual deficit 04/19/2021   Dyslipidemia, goal LDL below 70 04/18/2021   Hypertensive urgency 04/18/2021   Recurrent major depressive disorder, in partial remission ([place]) 09/20/2020   Status post total replacement of hip 04/04/2018   Primary osteoarthritis of right hip 02/20/2018   Allergic rhinitis 10/28/2015   Acute stress disorder 08/01/2015   Anxiety 08/01/2015   Clinical depression 08/01/2015   Diabetes ([place]) 08/01/2015   Diverticulosis of colon 08/01/2015   Generalized pruritus 08/01/2015   BP (high blood pressure) 08/01/2015   Cannot sleep 08/01/2015   Adiposity 08/01/2015   [place]or muscle hypertonia 08/01/2015   Hypercholesterolemia without  hypertriglyceridemia 08/01/2015   Female stress incontinence 08/01/2015   Avitaminosis D 08/01/2015   Leta Speller, MS, OTR/L  Darleene Cleaver, OT 05/29/2022, 1:21 PM  [place] MAIN [facility] SERVICES [address] [place], [place], [address] Phone: [phone]   Fax:  [phone]  Name: [name] MRN: [id number] Date of Birth: 1956/05/16

## 2022-06-02 ENCOUNTER — Ambulatory Visit: Payer: PPO

## 2022-06-02 DIAGNOSIS — M6281 Muscle weakness (generalized): Secondary | ICD-10-CM | POA: Diagnosis not present

## 2022-06-02 DIAGNOSIS — R278 Other lack of coordination: Secondary | ICD-10-CM

## 2022-06-03 NOTE — Therapy (Signed)
OUTPATIENT OCCUPATIONAL THERAPY TREATMENT NOTE   Patient Name: [name] MRN: [id number] DOB:[birth year], 66 y.o., female Today's Date: 06/03/2022  PCP: Dr. Birdie Sons REFERRING PROVIDER: Frann Rider, NP   OT End of Session - 06/02/22 1138     Visit Number 4    Number of Visits 24    Date for OT Re-Evaluation 08/10/22    OT Start Time 5    OT Stop Time 1145    OT Time Calculation (min) 70 min    Equipment Utilized During Treatment transport chair, SBQC    Activity Tolerance Patient tolerated treatment well    Behavior During Therapy WFL for tasks assessed/performed             Past Medical History:  Diagnosis Date   Anxiety    Arthritis    CAD (coronary artery disease)    a. 3 vessel CAD noted on coronary CTA 04/2021, s/p CABG x4   Cataract    left   Depression    Diabetes mellitus without complication ([place])    type 2   Environmental and seasonal allergies    GERD (gastroesophageal reflux disease)    Hyperlipidemia    Hypertension    Joint pain    as reported by patient   Papillary fibroelastoma of heart    of the aortic valve,  s/p AVR and tumor resection on 08/04/2021   Right thalamic infarction ([place]) 04/29/2021   S/P AVR (aortic valve replacement)    s/p AVR with resection of AV fibroelastoma on 08/04/2021   S/P CABG x 4    a. s/p CABG x4 LIMA to LAD, SVG to PDA, and Sequential SVG to OM and Diagonal on 8/   Stroke ([place]) 04/2021   admitted at [facility] - left sided weakness   Urinary incontinence    as stated by patient   Past Surgical History:  Procedure Laterality Date   AORTIC VALVE REPLACEMENT N/A 08/04/2021   Procedure: RESECTION AORTIC VALVE TUMOR;  Surgeon: [name], MD;  Location: [place];  Service: Open Heart Surgery;  Laterality: N/A;   APPLICATION OF WOUND VAC N/A 08/24/2021   Procedure: APPLICATION OF WOUND VAC;  Surgeon: Gaye Pollack, MD;  Location: [place] OR;  Service: Thoracic;  Laterality: N/A;   APPLICATION OF  WOUND VAC N/A 08/28/2021   Procedure: WOUND VAC CHANGE;  Surgeon: [name], MD;  Location: [place];  Service: Thoracic;  Laterality: N/A;   BREAST EXCISIONAL BIOPSY Right 1999   neg   BUBBLE STUDY  04/24/2021   Procedure: BUBBLE STUDY;  Surgeon: Sueanne Margarita, MD;  Location: [place];  Service: Cardiovascular;;   CATARACT EXTRACTION  12/2011   CATARACT EXTRACTION W/PHACO Left 12/02/2016   Procedure: CATARACT EXTRACTION PHACO AND INTRAOCULAR LENS PLACEMENT ([place]);  Surgeon: Estill Cotta, MD;  Location: [facility] ORS;  Service: Ophthalmology;  Laterality: Left;  [place] 2:14 AP% 28.4 CDE 59.73 Fluid pack lot # [id number]   COLONOSCOPY WITH PROPOFOL N/A 04/13/2022   Procedure: COLONOSCOPY WITH PROPOFOL;  Surgeon: Lin Landsman, MD;  Location: [facility] ENDOSCOPY;  Service: Gastroenterology;  Laterality: N/A;   CORONARY ARTERY BYPASS GRAFT N/A 08/04/2021   Procedure: CORONARY ARTERY BYPASS GRAFTING (CABG) X  4  USING LEFT INTERNAL MAMMARY ARTERY AND RIGHT GREATER SAPHENOUS VEIN ENDOSCOPIC CONDUITS;  Surgeon: [name], MD;  Location: [place];  Service: Open Heart Surgery;  Laterality: N/A;   DIAGNOSTIC LAPAROSCOPY     EYE SURGERY  IR CT HEAD LTD  04/19/2021   IR PERCUTANEOUS ART THROMBECTOMY/INFUSION INTRACRANIAL INC DIAG ANGIO  04/19/2021       IR PERCUTANEOUS ART THROMBECTOMY/INFUSION INTRACRANIAL INC DIAG ANGIO  04/19/2021   IR US GUIDE VASC ACCESS LEFT  04/19/2021   RADIOLOGY WITH ANESTHESIA N/A 04/19/2021   Procedure: IR WITH ANESTHESIA;  Surgeon: Luanne Bras, MD;  Location: [place];  Service: Radiology;  Laterality: N/A;   STERNAL WOUND DEBRIDEMENT N/A 08/24/2021   Procedure: STERNAL WOUND DRAINAGE AND DEBRIDEMENT;  Surgeon: Gaye Pollack, MD;  Location: [place] OR;  Service: Thoracic;  Laterality: N/A;   STERNAL WOUND DEBRIDEMENT N/A 08/28/2021   Procedure: STERNAL WOUND DEBRIDEMENT;  Surgeon: [name], MD;  Location: [place];  Service: Thoracic;   Laterality: N/A;   TEE WITHOUT CARDIOVERSION N/A 04/24/2021   Procedure: TRANSESOPHAGEAL ECHOCARDIOGRAM (TEE);  Surgeon: Sueanne Margarita, MD;  Location: [facility] ENDOSCOPY;  Service: Cardiovascular;  Laterality: N/A;   TEE WITHOUT CARDIOVERSION N/A 08/04/2021   Procedure: TRANSESOPHAGEAL ECHOCARDIOGRAM (TEE);  Surgeon: [name], MD;  Location: [place];  Service: Open Heart Surgery;  Laterality: N/A;   TOTAL HIP ARTHROPLASTY Right 04/04/2018   Procedure: TOTAL HIP ARTHROPLASTY;  Surgeon: Dereck Leep, MD;  Location: [facility] ORS;  Service: Orthopedics;  Laterality: Right;   Patient Active Problem List   Diagnosis Date Noted   Colon cancer screening    Subcutaneous nodule    Acute blood loss anemia    Diabetic peripheral neuropathy ([place])    Debility 08/11/2021   S/P CABG x 4 08/04/2021   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   Hyperkalemia    Chronic bilateral low back pain without sciatica    Leukocytosis    Hyponatremia    Essential hypertension    Slow transit constipation    Hemiparesis affecting left side as late effect of stroke ([place])    Aortic valve mass    History of CVA with residual deficit 04/19/2021   Dyslipidemia, goal LDL below 70 04/18/2021   Hypertensive urgency 04/18/2021   Recurrent major depressive disorder, in partial remission ([place]) 09/20/2020   Status post total replacement of hip 04/04/2018   Primary osteoarthritis of right hip 02/20/2018   Allergic rhinitis 10/28/2015   Acute stress disorder 08/01/2015   Anxiety 08/01/2015   Clinical depression 08/01/2015   Diabetes ([place]) 08/01/2015   Diverticulosis of colon 08/01/2015   Generalized pruritus 08/01/2015   BP (high blood pressure) 08/01/2015   Cannot sleep 08/01/2015   Adiposity 08/01/2015   Detrusor muscle hypertonia 08/01/2015   Hypercholesterolemia without hypertriglyceridemia 08/01/2015   Female stress incontinence 08/01/2015   Avitaminosis D 08/01/2015    ONSET DATE:  04/18/2021  REFERRING DIAG: hemiparesis affecting L side as late effect of CVA; dsymetria  THERAPY DIAG:  Muscle weakness (generalized)  Other lack of coordination  Rationale for Evaluation and Treatment Rehabilitation  PERTINENT HISTORY: Pt s/p R CVA on 04/18/21. Pt was initially recovering very well from a stroke standpoint with mild LUE >LLE numbness and ambulating with a cane. 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 Therapy eval's recommended CIR for decreased functional mobility - d/c to CIR on 8/29. She required incision debridement of sternal wound and application of VAC eventually discontinued on 9/29. She was discharged home on 9/30 (after a 32 day stay).  PRECAUTIONS: Fall  SUBJECTIVE: Pt continues to  verbalize needing to get her arm working better.   PAIN:  Are you having pain? Yes: NPRS scale: 4/10 Pain location: L hand 3rd digit PIP Pain description: aching/sharp; PIP triggering Aggravating factors: repetitive flexion of L hand 3rd digit PIP Relieving factors: Rest, avoidance of repetitive flexion, wearing trigger finger splint     OBJECTIVE:   TODAY'S TREATMENT:  Neuro re-ed: Pt participated in FMC/GMC activity working to place ball pegs into pegboard.  Pt required cues for isolating hand with WB to elbow and forearm on table top when placing pegs. With removal of pegs, OT held pegboard off table top to facilitate forward lateral and ipsilateral reaching at shoulder height.  Participated in coin manipulation activities with coin pick up from table (coins placed on washcloth for non-skid surface), storage of coins within palm of hand, transferring coins from palm of hand to fingertips to enable discarding coins one at a time without dropping. Practiced discarding coins into slotted bank with L hand, R hand  stabilizing bank.  Pt worked with grooved pegs, placing into pegboard.  Pt required extra time and multiple trials for peg pick up.  Pt not yet able to reposition peg to fit into the groove with peg between fingertips.  Instead pt repositioned pegboard or used larger arm movements to turn peg for placement into board.    PATIENT EDUCATION:  Education details: HEP progression Person educated: Patient Education method: Explanation and Verbal cues Education comprehension: verbalized understanding, returned demonstration, and needs further education   HOME EXERCISE PROGRAM Coin manipulation activities with L hand    OT Short Term Goals -        OT SHORT TERM GOAL #1   Title Pt will be indep to perform LUE HEP for coordination and strengthening.    Baseline Eval: not yet initiated    Time 6    Period Weeks    Status New    Target Date 06/29/22      OT SHORT TERM GOAL #2   Title Pt be indep with joint protection and pain management strategies to limit triggering of L hand 3rd digit PIP.    Baseline Eval: Trigger finger splint recommended, initiated joint protection educ    Time 6    Period Weeks    Status New    Target Date 06/29/22              OT Long Term Goals -       OT LONG TERM GOAL #1   Title Pt will increase FOTO by 1-2 points to indicate improvement in perceived functional indep with daily tasks.    Baseline Eval: FOTO 60    Time 12    Period Weeks    Status New    Target Date 08/10/22      OT LONG TERM GOAL #2   Title Pt will demonstrate increased L FMC/GMC to enable indep with putting hair in ponytail.    Baseline Eval: unable    Time 12    Period Weeks    Status New    Target Date 08/10/22      OT LONG TERM GOAL #3   Title Pt will increase L FMC to enable indep with tying shoe laces.    Baseline Eval: unable to tie laces; wears slip on shoes    Time 12    Period Weeks    Status New    Target Date 08/10/22      OT LONG TERM GOAL #4  Title Pt  will increase L hand dexterity skills to enable pt to pick up coins from table with L hand.    Baseline Eval: unable    Time 12    Period Weeks    Status New    Target Date 08/10/22      OT LONG TERM GOAL #5   Title Pt will increase L grip strength by 5 or more lbs to improve ability to hold and carry ADL supplies in L hand.    Baseline Eval: L grip 17# (R 35#)    Time 12    Period Weeks    Status New    Target Date 08/10/22             Plan - 06/02/22 1036     Clinical Impression Statement Pt reports she is consistently trying to reach for items in her refrigerator with her L hand.  Pt required non-skid surface for picking up coins from the table.  Pt often used palm to push a larger coin into slot, but was able to use fingertips to push a penny and dime through.  Pt not yet able to reposition peg to fit into the groove with peg between fingertips.  Instead pt repositioned pegboard or used larger arm movements to turn peg for placement into board.  Pt continues to report pain in L 3rd digit PIP and was encouraged to continue wearing trigger finger splint and limit repetitive gripping activities.  Pt will continue to benefit from skilled OT for increasing LUE FMC/GMC skills for more efficient use of LUE during daily tasks.    OT Occupational Profile and History Problem Focused Assessment - Including review of records relating to presenting problem    Occupational performance deficits (Please refer to evaluation for details): ADL's;IADL's;Work    Body Structure / Function / Physical Skills ADL;GMC;UE functional use;FMC;Decreased knowledge of use of DME;Pain;Strength;Coordination;Dexterity;IADL;Sensation;Balance    Rehab Potential Good    Clinical Decision Making Limited treatment options, no task modification necessary    Comorbidities Affecting Occupational Performance: May have comorbidities impacting occupational performance    Modification or Assistance to Complete Evaluation  No  modification of tasks or assist necessary to complete eval    OT Frequency 2x / week    OT Duration 12 weeks    OT Treatment/Interventions Self-care/ADL training;Therapeutic exercise;Therapeutic activities;Cryotherapy;Moist Heat;Neuromuscular education;DME and/or AE instruction;Manual Therapy;Splinting;Patient/family education    Plan OT focus on LUE coordination/strength training and education/splinting for L hand 3rd digit PIP triggering    OT Home Exercise Plan Not yet initiated    Consulted and Agree with Plan of Care Patient               Leta Speller, MS, OTR/L  Darleene Cleaver, OT 06/03/2022, 10:32 AM

## 2022-06-05 ENCOUNTER — Ambulatory Visit: Payer: PPO

## 2022-06-05 DIAGNOSIS — I6381 Other cerebral infarction due to occlusion or stenosis of small artery: Secondary | ICD-10-CM

## 2022-06-05 DIAGNOSIS — R278 Other lack of coordination: Secondary | ICD-10-CM

## 2022-06-05 DIAGNOSIS — M6281 Muscle weakness (generalized): Secondary | ICD-10-CM

## 2022-06-06 NOTE — Therapy (Signed)
OUTPATIENT OCCUPATIONAL THERAPY TREATMENT NOTE   Patient Name: [name] MRN: [id number] DOB:[birth year], 66 y.o., female Today's Date: 06/06/2022  PCP: Dr. Malva Limes REFERRING PROVIDER: Ihor Austin, NP   OT End of Session - 06/06/22 1934     Visit Number 5    Number of Visits 24    Date for OT Re-Evaluation 08/10/22    OT Start Time 1045    OT Stop Time 1130    OT Time Calculation (min) 45 min    Equipment Utilized During Treatment transport chair, SBQC    Activity Tolerance Patient tolerated treatment well    Behavior During Therapy WFL for tasks assessed/performed             Past Medical History:  Diagnosis Date   Anxiety    Arthritis    CAD (coronary artery disease)    a. 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 s/p CABG x4 LIMA to LAD, SVG to PDA, and Sequential SVG to OM and Diagonal on 8/   Stroke ([facility]) 04/2021   admitted at [facility] - left sided weakness   Urinary incontinence    as stated by patient   Past Surgical History:  Procedure Laterality Date   AORTIC VALVE REPLACEMENT N/A 08/04/2021   Procedure: RESECTION AORTIC VALVE TUMOR;  Surgeon: Loreli Slot, MD;  Location: [facility] OR;  Service: Open Heart Surgery;  Laterality: N/A;   APPLICATION OF WOUND VAC N/A 08/24/2021   Procedure: APPLICATION OF WOUND VAC;  Surgeon: Alleen Borne, MD;  [facility];  Service: Thoracic;  Laterality: N/A;   APPLICATION OF  WOUND VAC N/A 08/28/2021   Procedure: WOUND VAC CHANGE;  Surgeon: Loreli Slot, MD;  [facility];  Service: Thoracic;  Laterality: N/A;   BREAST EXCISIONAL BIOPSY Right 1999   neg   BUBBLE STUDY  04/24/2021   Procedure: BUBBLE STUDY;  Surgeon: Quintella Reichert, MD;  Location: [facility] ENDOSCOPY;  Service: Cardiovascular;;   CATARACT EXTRACTION  12/2011   CATARACT EXTRACTION W/PHACO Left 12/02/2016   Procedure: CATARACT EXTRACTION PHACO AND INTRAOCULAR LENS PLACEMENT ([facility]);  Surgeon: Sallee Lange, MD;  Location: [facility];  Service: Ophthalmology;  Laterality: Left;  [place] 2:14 AP% 28.4 CDE 59.73 Fluid pack lot # [id number]   COLONOSCOPY WITH PROPOFOL N/A 04/13/2022   Procedure: COLONOSCOPY WITH PROPOFOL;  Surgeon: Toney Reil, MD;  Location: [facility] ENDOSCOPY;  Service: Gastroenterology;  Laterality: N/A;   CORONARY ARTERY BYPASS GRAFT N/A 08/04/2021   Procedure: CORONARY ARTERY BYPASS GRAFTING (CABG) X  4  USING LEFT INTERNAL MAMMARY ARTERY AND RIGHT GREATER SAPHENOUS VEIN ENDOSCOPIC CONDUITS;  Surgeon: Loreli Slot, MD;  [facility];  Service: Open Heart Surgery;  Laterality: N/A;   DIAGNOSTIC LAPAROSCOPY     EYE SURGERY  IR CT HEAD LTD  04/19/2021   IR PERCUTANEOUS ART THROMBECTOMY/INFUSION INTRACRANIAL INC DIAG ANGIO  04/19/2021       IR PERCUTANEOUS ART THROMBECTOMY/INFUSION INTRACRANIAL INC DIAG ANGIO  04/19/2021   IR US GUIDE VASC ACCESS LEFT  04/19/2021   RADIOLOGY WITH ANESTHESIA N/A 04/19/2021   Procedure: IR WITH ANESTHESIA;  Surgeon: Julieanne Cotton, MD;  [facility];  Service: Radiology;  Laterality: N/A;   STERNAL WOUND DEBRIDEMENT N/A 08/24/2021   Procedure: STERNAL WOUND DRAINAGE AND DEBRIDEMENT;  Surgeon: Alleen Borne, MD;  [facility];  Service: Thoracic;  Laterality: N/A;   STERNAL WOUND DEBRIDEMENT N/A 08/28/2021   Procedure: STERNAL WOUND DEBRIDEMENT;  Surgeon: Loreli Slot, MD;  Location: [facility] OR;  Service: Thoracic;   Laterality: N/A;   TEE WITHOUT CARDIOVERSION N/A 04/24/2021   Procedure: TRANSESOPHAGEAL ECHOCARDIOGRAM (TEE);  Surgeon: Quintella Reichert, MD;  Location: [facility] ENDOSCOPY;  Service: Cardiovascular;  Laterality: N/A;   TEE WITHOUT CARDIOVERSION N/A 08/04/2021   Procedure: TRANSESOPHAGEAL ECHOCARDIOGRAM (TEE);  Surgeon: Loreli Slot, MD;  Location: [facility] OR;  Service: Open Heart Surgery;  Laterality: N/A;   TOTAL HIP ARTHROPLASTY Right 04/04/2018   Procedure: TOTAL HIP ARTHROPLASTY;  Surgeon: Donato Heinz, MD;  Location: [facility] ORS;  Service: Orthopedics;  Laterality: Right;   Patient Active Problem List   Diagnosis Date Noted   Colon cancer screening    Subcutaneous nodule    Acute blood loss anemia    Diabetic peripheral neuropathy (HCC)    Debility 08/11/2021   S/P CABG x 4 08/04/2021   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   Hyperkalemia    Chronic bilateral low back pain without sciatica    Leukocytosis    Hyponatremia    Essential hypertension    Slow transit constipation    Hemiparesis affecting left side as late effect of stroke (HCC)    Aortic valve mass    History of CVA with residual deficit 04/19/2021   Dyslipidemia, goal LDL below 70 04/18/2021   Hypertensive urgency 04/18/2021   Recurrent major depressive disorder, in partial remission (HCC) 09/20/2020   Status post total replacement of hip 04/04/2018   Primary osteoarthritis of right hip 02/20/2018   Allergic rhinitis 10/28/2015   Acute stress disorder 08/01/2015   Anxiety 08/01/2015   Clinical depression 08/01/2015   Diabetes (HCC) 08/01/2015   Diverticulosis of colon 08/01/2015   Generalized pruritus 08/01/2015   BP (high blood pressure) 08/01/2015   Cannot sleep 08/01/2015   Adiposity 08/01/2015   Detrusor muscle hypertonia 08/01/2015   Hypercholesterolemia without hypertriglyceridemia 08/01/2015   Female stress incontinence 08/01/2015   Avitaminosis D 08/01/2015    ONSET DATE:  04/18/2021  REFERRING DIAG: hemiparesis affecting L side as late effect of CVA; dsymetria  THERAPY DIAG:  Muscle weakness (generalized)  Other lack of coordination  Right thalamic infarction [facility])  Rationale for Evaluation and Treatment Rehabilitation  PERTINENT HISTORY: Pt s/p R CVA on 04/18/21. Pt was initially recovering very well from a stroke standpoint with mild LUE >LLE numbness and ambulating with a cane. 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 Therapy eval's recommended CIR for decreased functional mobility - d/c to CIR on 8/29. She required incision debridement of sternal wound and application of VAC eventually discontinued on 9/29. She was discharged home on 9/30 (after a 32 day stay).  PRECAUTIONS: Fall  SUBJECTIVE: Pt pleased with her improvement this session.  See note for details.    PAIN:  Are you having pain? Yes: NPRS scale: 4/10 Pain location: L hand 3rd digit PIP Pain description: aching/sharp; PIP triggering Aggravating factors: repetitive flexion of L hand 3rd digit PIP Relieving factors: Rest, avoidance of repetitive flexion, wearing trigger finger splint    OBJECTIVE:   TODAY'S TREATMENT:  Paraffin: 10 min To L hand for pain management/muscle relaxation in prep for neuro re-ed.   Neuro re-ed: Participation in [facility] activity, picking up glass stones from table top using L hand.  Did not require non-skid surface today.   Focused on reaching toward a target placing stones on top of short jumbo pegs without knocking down pegs.  Practiced sliding stones around pegs on table top without knocking down the pegs.     PATIENT EDUCATION:  Education details: HEP progression Person educated: Patient Education method: Explanation and Verbal cues Education comprehension: verbalized understanding, returned  demonstration, and needs further education   HOME EXERCISE PROGRAM Coin manipulation activities with L hand    OT Short Term Goals -        OT SHORT TERM GOAL #1   Title Pt will be indep to perform LUE HEP for coordination and strengthening.    Baseline Eval: not yet initiated    Time 6    Period Weeks    Status New    Target Date 06/29/22      OT SHORT TERM GOAL #2   Title Pt be indep with joint protection and pain management strategies to limit triggering of L hand 3rd digit PIP.    Baseline Eval: Trigger finger splint recommended, initiated joint protection educ    Time 6    Period Weeks    Status New    Target Date 06/29/22              OT Long Term Goals -       OT LONG TERM GOAL #1   Title Pt will increase FOTO by 1-2 points to indicate improvement in perceived functional indep with daily tasks.    Baseline Eval: FOTO 60    Time 12    Period Weeks    Status New    Target Date 08/10/22      OT LONG TERM GOAL #2   Title Pt will demonstrate increased L FMC/GMC to enable indep with putting hair in ponytail.    Baseline Eval: unable    Time 12    Period Weeks    Status New    Target Date 08/10/22      OT LONG TERM GOAL #3   Title Pt will increase L FMC to enable indep with tying shoe laces.    Baseline Eval: unable to tie laces; wears slip on shoes    Time 12    Period Weeks    Status New    Target Date 08/10/22      OT LONG TERM GOAL #4   Title Pt will increase L hand dexterity skills to enable pt to pick up coins from table with L hand.    Baseline Eval: unable    Time 12    Period Weeks    Status New    Target Date 08/10/22      OT LONG TERM GOAL #5   Title Pt will increase L grip strength by 5 or more lbs to improve ability to hold and carry ADL supplies in L hand.  Baseline Eval: L grip 17# (R 35#)    Time 12    Period Weeks    Status New    Target Date 08/10/22             Plan - 06/02/22 1036     Clinical Impression Statement  Pt with good tolerance to paraffin today and continued to focus on therapeutic exercises that did not require repetitive gripping to prevent triggering of L 3rd digit PIP.  Pt continues to wear trigger finger splint at home, which she feels is helpful.  Pt did not require non-skid surface for picking up glass stones from table top today.  Pt did frequently knock down pegs standing up on table during peg and stone activity; pt continues to limited by moderate ataxia throughout LUE.  Pt will continue to benefit from skilled OT for increasing LUE FMC/GMC skills for more efficient use of LUE during daily tasks.    OT Occupational Profile and History Problem Focused Assessment - Including review of records relating to presenting problem    Occupational performance deficits (Please refer to evaluation for details): ADL's;IADL's;Work    Body Structure / Function / Physical Skills ADL;GMC;UE functional use;FMC;Decreased knowledge of use of DME;Pain;Strength;Coordination;Dexterity;IADL;Sensation;Balance    Rehab Potential Good    Clinical Decision Making Limited treatment options, no task modification necessary    Comorbidities Affecting Occupational Performance: May have comorbidities impacting occupational performance    Modification or Assistance to Complete Evaluation  No modification of tasks or assist necessary to complete eval    OT Frequency 2x / week    OT Duration 12 weeks    OT Treatment/Interventions Self-care/ADL training;Therapeutic exercise;Therapeutic activities;Cryotherapy;Moist Heat;Neuromuscular education;DME and/or AE instruction;Manual Therapy;Splinting;Patient/family education    Plan OT focus on LUE coordination/strength training and education/splinting for L hand 3rd digit PIP triggering    OT Home Exercise Plan Not yet initiated    Consulted and Agree with Plan of Care Patient               [name], MS, OTR/L  Otis Dials, OT 06/06/2022, 7:40 PM

## 2022-06-08 ENCOUNTER — Ambulatory Visit: Payer: PPO

## 2022-06-08 DIAGNOSIS — M6281 Muscle weakness (generalized): Secondary | ICD-10-CM | POA: Diagnosis not present

## 2022-06-08 DIAGNOSIS — I6930 Unspecified sequelae of cerebral infarction: Secondary | ICD-10-CM

## 2022-06-12 ENCOUNTER — Ambulatory Visit: Payer: PPO

## 2022-06-12 DIAGNOSIS — M6281 Muscle weakness (generalized): Secondary | ICD-10-CM | POA: Diagnosis not present

## 2022-06-12 DIAGNOSIS — R482 Apraxia: Secondary | ICD-10-CM

## 2022-06-12 DIAGNOSIS — R278 Other lack of coordination: Secondary | ICD-10-CM

## 2022-06-12 NOTE — Therapy (Signed)
OUTPATIENT OCCUPATIONAL THERAPY TREATMENT NOTE   Patient Name: [name] MRN: [id number] DOB:[birth year], 66 y.o., female Today's Date: 06/12/2022  PCP: Dr. Birdie Sons REFERRING PROVIDER: Frann Rider, NP   OT End of Session - 06/12/22 2207     Visit Number 7    Number of Visits 24    Date for OT Re-Evaluation 08/10/22    OT Start Time 19    OT Stop Time 1145    OT Time Calculation (min) 29 min    Equipment Utilized During Treatment transport chair, SBQC    Activity Tolerance Patient tolerated treatment well    Behavior During Therapy WFL for tasks assessed/performed             Past Medical History:  Diagnosis Date   Anxiety    Arthritis    CAD (coronary artery disease)    a. 3 vessel CAD noted on coronary CTA 04/2021, s/p CABG x4   Cataract    left   Depression    Diabetes mellitus without complication ([place])    type 2   Environmental and seasonal allergies    GERD (gastroesophageal reflux disease)    Hyperlipidemia    Hypertension    Joint pain    as reported by patient   Papillary fibroelastoma of heart    of the aortic valve,  s/p AVR and tumor resection on 08/04/2021   Right thalamic infarction ([place]) 04/29/2021   S/P AVR (aortic valve replacement)    s/p AVR with resection of AV fibroelastoma on 08/04/2021   S/P CABG x 4    a. s/p CABG x4 LIMA to LAD, SVG to PDA, and Sequential SVG to OM and Diagonal on 8/   Stroke ([place]) 04/2021   admitted at [facility] - left sided weakness   Urinary incontinence    as stated by patient   Past Surgical History:  Procedure Laterality Date   AORTIC VALVE REPLACEMENT N/A 08/04/2021   Procedure: RESECTION AORTIC VALVE TUMOR;  Surgeon: [name], MD;  Location: [place];  Service: Open Heart Surgery;  Laterality: N/A;   APPLICATION OF WOUND VAC N/A 08/24/2021   Procedure: APPLICATION OF WOUND VAC;  Surgeon: Gaye Pollack, MD;  Location: [place] [facility];  Service: Thoracic;  Laterality: N/A;   APPLICATION OF  WOUND VAC N/A 08/28/2021   Procedure: WOUND VAC CHANGE;  Surgeon: [name], MD;  Location: [place];  Service: Thoracic;  Laterality: N/A;   BREAST EXCISIONAL BIOPSY Right 1999   neg   BUBBLE STUDY  04/24/2021   Procedure: BUBBLE STUDY;  Surgeon: Sueanne Margarita, MD;  Location: [place];  Service: Cardiovascular;;   CATARACT EXTRACTION  12/2011   CATARACT EXTRACTION W/PHACO Left 12/02/2016   Procedure: CATARACT EXTRACTION PHACO AND INTRAOCULAR LENS PLACEMENT ([place]);  Surgeon: Estill Cotta, MD;  Location: [facility];  Service: Ophthalmology;  Laterality: Left;  [place] 2:14 AP% 28.4 CDE 59.73 Fluid pack lot # [id number]   COLONOSCOPY WITH PROPOFOL N/A 04/13/2022   Procedure: COLONOSCOPY WITH PROPOFOL;  Surgeon: Lin Landsman, MD;  Location: [facility] ENDOSCOPY;  Service: Gastroenterology;  Laterality: N/A;   CORONARY ARTERY BYPASS GRAFT N/A 08/04/2021   Procedure: CORONARY ARTERY BYPASS GRAFTING (CABG) X  4  USING LEFT INTERNAL MAMMARY ARTERY AND RIGHT GREATER SAPHENOUS VEIN ENDOSCOPIC CONDUITS;  Surgeon: [name], MD;  Location: [place];  Service: Open Heart Surgery;  Laterality: N/A;   DIAGNOSTIC LAPAROSCOPY     EYE SURGERY  IR CT HEAD LTD  04/19/2021   IR PERCUTANEOUS ART THROMBECTOMY/INFUSION INTRACRANIAL INC DIAG ANGIO  04/19/2021       IR PERCUTANEOUS ART THROMBECTOMY/INFUSION INTRACRANIAL INC DIAG ANGIO  04/19/2021   IR US GUIDE VASC ACCESS LEFT  04/19/2021   RADIOLOGY WITH ANESTHESIA N/A 04/19/2021   Procedure: IR WITH ANESTHESIA;  Surgeon: Luanne Bras, MD;  Location: [place];  Service: Radiology;  Laterality: N/A;   STERNAL WOUND DEBRIDEMENT N/A 08/24/2021   Procedure: STERNAL WOUND DRAINAGE AND DEBRIDEMENT;  Surgeon: Gaye Pollack, MD;  Location: [place] [facility];  Service: Thoracic;  Laterality: N/A;   STERNAL WOUND DEBRIDEMENT N/A 08/28/2021   Procedure: STERNAL WOUND DEBRIDEMENT;  Surgeon: [name], MD;  Location: [place];  Service: Thoracic;   Laterality: N/A;   TEE WITHOUT CARDIOVERSION N/A 04/24/2021   Procedure: TRANSESOPHAGEAL ECHOCARDIOGRAM (TEE);  Surgeon: Sueanne Margarita, MD;  Location: [facility]OSCOPY;  Service: Cardiovascular;  Laterality: N/A;   TEE WITHOUT CARDIOVERSION N/A 08/04/2021   Procedure: TRANSESOPHAGEAL ECHOCARDIOGRAM (TEE);  Surgeon: [name], MD;  Location: [place];  Service: Open Heart Surgery;  Laterality: N/A;   TOTAL HIP ARTHROPLASTY Right 04/04/2018   Procedure: TOTAL HIP ARTHROPLASTY;  Surgeon: Dereck Leep, MD;  Location: [facility];  Service: Orthopedics;  Laterality: Right;   Patient Active Problem List   Diagnosis Date Noted   Colon cancer screening    Subcutaneous nodule    Acute blood loss anemia    Diabetic peripheral neuropathy ([place])    Debility 08/11/2021   S/P CABG x 4 08/04/2021   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   Hyperkalemia    Chronic bilateral low back pain without sciatica    Leukocytosis    Hyponatremia    Essential hypertension    Slow transit constipation    Hemiparesis affecting left side as late effect of stroke ([place])    Aortic valve mass    History of CVA with residual deficit 04/19/2021   Dyslipidemia, goal LDL below 70 04/18/2021   Hypertensive urgency 04/18/2021   Recurrent major depressive disorder, in partial remission ([place]) 09/20/2020   Status post total replacement of hip 04/04/2018   Primary osteoarthritis of right hip 02/20/2018   Allergic rhinitis 10/28/2015   Acute stress disorder 08/01/2015   Anxiety 08/01/2015   Clinical depression 08/01/2015   Diabetes ([place]) 08/01/2015   Diverticulosis of colon 08/01/2015   Generalized pruritus 08/01/2015   BP (high blood pressure) 08/01/2015   Cannot sleep 08/01/2015   Adiposity 08/01/2015   Detrusor muscle hypertonia 08/01/2015   Hypercholesterolemia without hypertriglyceridemia 08/01/2015   Female stress incontinence 08/01/2015   Avitaminosis D 08/01/2015    ONSET DATE:  04/18/2021  REFERRING DIAG: hemiparesis affecting L side as late effect of CVA; dsymetria  THERAPY DIAG:  Apraxia  Muscle weakness (generalized)  Other lack of coordination  Rationale for Evaluation and Treatment Rehabilitation  PERTINENT HISTORY: Pt s/p R CVA on 04/18/21. Pt was initially recovering very well from a stroke standpoint with mild LUE >LLE numbness and ambulating with a cane. 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 Therapy eval's recommended CIR for decreased functional mobility - d/c to CIR on 8/29. She required incision debridement of sternal wound and application of VAC eventually discontinued on 9/29. She was discharged home on 9/30 (after a 32 day stay).  PRECAUTIONS: Fall  SUBJECTIVE: Pt  reports still having some having some pain in her L 3rd digit, but continues to wear her trigger finger splint when using her hand.   PAIN:  Are you having pain? Yes: NPRS scale: 4/10 Pain location: L hand 3rd digit PIP Pain description: aching/sharp; PIP triggering Aggravating factors: repetitive flexion of L hand 3rd digit PIP Relieving factors: Rest, avoidance of repetitive flexion, wearing trigger finger splint    OBJECTIVE:   TODAY'S TREATMENT:  Paraffin: 10 min To L hand for pain management/muscle relaxation in prep for neuro re-ed.   Neuro re-ed: Participation in [facility] picking up small pegs and placing into pegboard with L hand, pegboard at table top level.  Small pegs placed on washcloth for easier pick up, and vc for 2 point and 3 point pinch patterns.  Facilitated reaching toward a target with reach at shoulder level to remove pegs from board.   PATIENT EDUCATION:  Education details: HEP progression Person educated: Patient Education method: Explanation and Verbal cues Education comprehension: verbalized  understanding, returned demonstration, and needs further education   HOME EXERCISE PROGRAM Coin manipulation activities with L hand    OT Short Term Goals -        OT SHORT TERM GOAL #1   Title Pt will be indep to perform LUE HEP for coordination and strengthening.    Baseline Eval: not yet initiated    Time 6    Period Weeks    Status New    Target Date 06/29/22      OT SHORT TERM GOAL #2   Title Pt be indep with joint protection and pain management strategies to limit triggering of L hand 3rd digit PIP.    Baseline Eval: Trigger finger splint recommended, initiated joint protection educ    Time 6    Period Weeks    Status New    Target Date 06/29/22              OT Long Term Goals -       OT LONG TERM GOAL #1   Title Pt will increase FOTO by 1-2 points to indicate improvement in perceived functional indep with daily tasks.    Baseline Eval: FOTO 60    Time 12    Period Weeks    Status New    Target Date 08/10/22      OT LONG TERM GOAL #2   Title Pt will demonstrate increased L FMC/GMC to enable indep with putting hair in ponytail.    Baseline Eval: unable    Time 12    Period Weeks    Status New    Target Date 08/10/22      OT LONG TERM GOAL #3   Title Pt will increase L FMC to enable indep with tying shoe laces.    Baseline Eval: unable to tie laces; wears slip on shoes    Time 12    Period Weeks    Status New    Target Date 08/10/22      OT LONG TERM GOAL #4   Title Pt will increase L hand dexterity skills to enable pt to pick up coins from table with L hand.    Baseline Eval: unable    Time 12    Period Weeks    Status New    Target Date 08/10/22      OT LONG TERM GOAL #5   Title Pt will increase L grip strength by 5 or more lbs to improve ability to hold  and carry ADL supplies in L hand.    Baseline Eval: L grip 17# (R 35#)    Time 12    Period Weeks    Status New    Target Date 08/10/22             Plan - 06/02/22 1036      Clinical Impression Statement Pt with good tolerance to paraffin today and continued to focus on therapeutic exercises that did not require repetitive gripping to prevent triggering of L 3rd digit PIP.  Pt continues to wear trigger finger splint at home, which she feels is helpful.  Pt worked on smaller item pick up today with small pegs and placing into pegboard.  Pt requires extra time and cues for 2 point and 3 point pinch patterns.  Pt will continue to benefit from skilled OT for increasing LUE FMC/GMC skills for more efficient use of LUE during daily tasks.    OT Occupational Profile and History Problem Focused Assessment - Including review of records relating to presenting problem    Occupational performance deficits (Please refer to evaluation for details): ADL's;IADL's;Work    Body Structure / Function / Physical Skills ADL;GMC;UE functional use;FMC;Decreased knowledge of use of DME;Pain;Strength;Coordination;Dexterity;IADL;Sensation;Balance    Rehab Potential Good    Clinical Decision Making Limited treatment options, no task modification necessary    Comorbidities Affecting Occupational Performance: May have comorbidities impacting occupational performance    Modification or Assistance to Complete Evaluation  No modification of tasks or assist necessary to complete eval    OT Frequency 2x / week    OT Duration 12 weeks    OT Treatment/Interventions Self-care/ADL training;Therapeutic exercise;Therapeutic activities;Cryotherapy;Moist Heat;Neuromuscular education;DME and/or AE instruction;Manual Therapy;Splinting;Patient/family education    Plan OT focus on LUE coordination/strength training and education/splinting for L hand 3rd digit PIP triggering    OT Home Exercise Plan Not yet initiated    Consulted and Agree with Plan of Care Patient               Leta Speller, MS, OTR/L  Darleene Cleaver, OT 06/12/2022, 10:08 PM

## 2022-06-17 ENCOUNTER — Ambulatory Visit: Payer: PPO | Attending: Adult Health

## 2022-06-17 DIAGNOSIS — M6281 Muscle weakness (generalized): Secondary | ICD-10-CM | POA: Insufficient documentation

## 2022-06-17 DIAGNOSIS — I6381 Other cerebral infarction due to occlusion or stenosis of small artery: Secondary | ICD-10-CM | POA: Diagnosis not present

## 2022-06-17 DIAGNOSIS — I6930 Unspecified sequelae of cerebral infarction: Secondary | ICD-10-CM | POA: Insufficient documentation

## 2022-06-17 DIAGNOSIS — R278 Other lack of coordination: Secondary | ICD-10-CM | POA: Insufficient documentation

## 2022-06-18 NOTE — Therapy (Signed)
OUTPATIENT OCCUPATIONAL THERAPY TREATMENT NOTE   Patient Name: [name] MRN: [id number] DOB:11/18/1956, 66 y.o., female Today's Date: 06/18/2022  PCP: Dr. Birdie Sons REFERRING PROVIDER: Frann Rider, NP   OT End of Session - 06/18/22 2013     Visit Number 8    Number of Visits 24    Date for OT Re-Evaluation 08/10/22    OT Start Time 1152    OT Stop Time 1237    OT Time Calculation (min) 69 min    Equipment Utilized During Treatment transport chair, SBQC    Activity Tolerance Patient tolerated treatment well    Behavior During Therapy WFL for tasks assessed/performed             Past Medical History:  Diagnosis Date   Anxiety    Arthritis    CAD (coronary artery disease)    a. 3 vessel CAD noted on coronary CTA 04/2021, s/p CABG x4   Cataract    left   Depression    Diabetes mellitus without complication ([place])    type 2   Environmental and seasonal allergies    GERD (gastroesophageal reflux disease)    Hyperlipidemia    Hypertension    Joint pain    as reported by patient   Papillary fibroelastoma of heart    of the aortic valve,  s/p AVR and tumor resection on 08/04/2021   Right thalamic infarction ([place]) 04/29/2021   S/P AVR (aortic valve replacement)    s/p AVR with resection of AV fibroelastoma on 08/04/2021   S/P CABG x 4    a. s/p CABG x4 LIMA to LAD, SVG to PDA, and Sequential SVG to OM and Diagonal on 8/   Stroke ([place]) 04/2021   admitted at [facility] - left sided weakness   Urinary incontinence    as stated by patient   Past Surgical History:  Procedure Laterality Date   AORTIC VALVE REPLACEMENT N/A 08/04/2021   Procedure: RESECTION AORTIC VALVE TUMOR;  Surgeon: [name], MD;  Location: [place];  Service: Open Heart Surgery;  Laterality: N/A;   APPLICATION OF WOUND VAC N/A 08/24/2021   Procedure: APPLICATION OF WOUND VAC;  Surgeon: Gaye Pollack, MD;  Location: [place] [facility];  Service: Thoracic;  Laterality: N/A;   APPLICATION OF  WOUND VAC N/A 08/28/2021   Procedure: WOUND VAC CHANGE;  Surgeon: [name], MD;  Location: [place];  Service: Thoracic;  Laterality: N/A;   BREAST EXCISIONAL BIOPSY Right 1999   neg   BUBBLE STUDY  04/24/2021   Procedure: BUBBLE STUDY;  Surgeon: Sueanne Margarita, MD;  Location: [place];  Service: Cardiovascular;;   CATARACT EXTRACTION  12/2011   CATARACT EXTRACTION W/PHACO Left 12/02/2016   Procedure: CATARACT EXTRACTION PHACO AND INTRAOCULAR LENS PLACEMENT ([place]);  Surgeon: Estill Cotta, MD;  Location: [facility];  Service: Ophthalmology;  Laterality: Left;  [place] 2:14 AP% 28.4 CDE 59.73 Fluid pack lot # [id number]   COLONOSCOPY WITH PROPOFOL N/A 04/13/2022   Procedure: COLONOSCOPY WITH PROPOFOL;  Surgeon: Lin Landsman, MD;  Location: [facility] ENDOSCOPY;  Service: Gastroenterology;  Laterality: N/A;   CORONARY ARTERY BYPASS GRAFT N/A 08/04/2021   Procedure: CORONARY ARTERY BYPASS GRAFTING (CABG) X  4  USING LEFT INTERNAL MAMMARY ARTERY AND RIGHT GREATER SAPHENOUS VEIN ENDOSCOPIC CONDUITS;  Surgeon: [name], MD;  Location: [place];  Service: Open Heart Surgery;  Laterality: N/A;   DIAGNOSTIC LAPAROSCOPY     EYE SURGERY  IR CT HEAD LTD  04/19/2021   IR PERCUTANEOUS ART THROMBECTOMY/INFUSION INTRACRANIAL INC DIAG ANGIO  04/19/2021       IR PERCUTANEOUS ART THROMBECTOMY/INFUSION INTRACRANIAL INC DIAG ANGIO  04/19/2021   IR US GUIDE VASC ACCESS LEFT  04/19/2021   RADIOLOGY WITH ANESTHESIA N/A 04/19/2021   Procedure: IR WITH ANESTHESIA;  Surgeon: Luanne Bras, MD;  Location: [place];  Service: Radiology;  Laterality: N/A;   STERNAL WOUND DEBRIDEMENT N/A 08/24/2021   Procedure: STERNAL WOUND DRAINAGE AND DEBRIDEMENT;  Surgeon: Gaye Pollack, MD;  Location: [place] [facility];  Service: Thoracic;  Laterality: N/A;   STERNAL WOUND DEBRIDEMENT N/A 08/28/2021   Procedure: STERNAL WOUND DEBRIDEMENT;  Surgeon: [name], MD;  Location: [place];  Service: Thoracic;   Laterality: N/A;   TEE WITHOUT CARDIOVERSION N/A 04/24/2021   Procedure: TRANSESOPHAGEAL ECHOCARDIOGRAM (TEE);  Surgeon: Sueanne Margarita, MD;  Location: [facility] ENDOSCOPY;  Service: Cardiovascular;  Laterality: N/A;   TEE WITHOUT CARDIOVERSION N/A 08/04/2021   Procedure: TRANSESOPHAGEAL ECHOCARDIOGRAM (TEE);  Surgeon: [name], MD;  Location: [place];  Service: Open Heart Surgery;  Laterality: N/A;   TOTAL HIP ARTHROPLASTY Right 04/04/2018   Procedure: TOTAL HIP ARTHROPLASTY;  Surgeon: Dereck Leep, MD;  Location: [facility];  Service: Orthopedics;  Laterality: Right;   Patient Active Problem List   Diagnosis Date Noted   Colon cancer screening    Subcutaneous nodule    Acute blood loss anemia    Diabetic peripheral neuropathy ([place])    Debility 08/11/2021   S/P CABG x 4 08/04/2021   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   Hyperkalemia    Chronic bilateral low back pain without sciatica    Leukocytosis    Hyponatremia    Essential hypertension    Slow transit constipation    Hemiparesis affecting left side as late effect of stroke ([place])    Aortic valve mass    History of CVA with residual deficit 04/19/2021   Dyslipidemia, goal LDL below 70 04/18/2021   Hypertensive urgency 04/18/2021   Recurrent major depressive disorder, in partial remission ([place]) 09/20/2020   Status post total replacement of hip 04/04/2018   Primary osteoarthritis of right hip 02/20/2018   Allergic rhinitis 10/28/2015   Acute stress disorder 08/01/2015   Anxiety 08/01/2015   Clinical depression 08/01/2015   Diabetes ([place]) 08/01/2015   Diverticulosis of colon 08/01/2015   Generalized pruritus 08/01/2015   BP (high blood pressure) 08/01/2015   Cannot sleep 08/01/2015   Adiposity 08/01/2015   Detrusor muscle hypertonia 08/01/2015   Hypercholesterolemia without hypertriglyceridemia 08/01/2015   Female stress incontinence 08/01/2015   Avitaminosis D 08/01/2015    ONSET DATE:  04/18/2021  REFERRING DIAG: hemiparesis affecting L side as late effect of CVA; dsymetria  THERAPY DIAG:  Muscle weakness (generalized)  Other lack of coordination  Rationale for Evaluation and Treatment Rehabilitation  PERTINENT HISTORY: Pt s/p R CVA on 04/18/21. Pt was initially recovering very well from a stroke standpoint with mild LUE >LLE numbness and ambulating with a cane. 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 Therapy eval's recommended CIR for decreased functional mobility - d/c to CIR on 8/29. She required incision debridement of sternal wound and application of VAC eventually discontinued on 9/29. She was discharged home on 9/30 (after a 32 day stay).  PRECAUTIONS: Fall  SUBJECTIVE: Pt continues to  report pain in L 3rd digit with frequent triggering of the PIP if not wearing her digit blocking splint.   PAIN:  Are you having pain? Yes: NPRS scale: 4/10 Pain location: L hand 3rd digit PIP Pain description: aching/sharp; PIP triggering Aggravating factors: repetitive flexion of L hand 3rd digit PIP Relieving factors: Rest, avoidance of repetitive flexion, wearing trigger finger splint    OBJECTIVE:   TODAY'S TREATMENT:  Paraffin: 10 min To L hand for pain management/muscle relaxation in prep for neuro re-ed.   Neuro re-ed: Participation in [facility] with [employer]ar pegs and pegboard.  Used washcloth as non-skid surface for easier pick up of pegs using L hand and then worked to move pegs from horizontal to vertical position in pegboard.  Pt required intermittent vc for technique and normalized movement patterns, as pt tends to lift elbow in the air when performing a [place] task at table top level.  Pt able to correct and rest elbow and forearm on table top to isolate distal movements, but required intermittent cues throughout  task.  Practiced use of L hand for object manipulation at table top; pt stood up jumbo pegs vertically on table top and practiced controlled reaching placing small glass stones on top of pegs without knocking over both.     PATIENT EDUCATION:  Education details: Continue to limit repetitive flexion of L 3rd digit and wear splint with activity to prevent triggering.  Person educated: Patient Education method: Explanation and Verbal cues Education comprehension: verbalized understanding, returned demonstration, and needs further education   HOME EXERCISE PROGRAM FMC/GMC activities for LUE    OT Short Term Goals -        OT SHORT TERM GOAL #1   Title Pt will be indep to perform LUE HEP for coordination and strengthening.    Baseline Eval: not yet initiated    Time 6    Period Weeks    Status New    Target Date 06/29/22      OT SHORT TERM GOAL #2   Title Pt be indep with joint protection and pain management strategies to limit triggering of L hand 3rd digit PIP.    Baseline Eval: Trigger finger splint recommended, initiated joint protection educ    Time 6    Period Weeks    Status New    Target Date 06/29/22              OT Long Term Goals -       OT LONG TERM GOAL #1   Title Pt will increase FOTO by 1-2 points to indicate improvement in perceived functional indep with daily tasks.    Baseline Eval: FOTO 60    Time 12    Period Weeks    Status New    Target Date 08/10/22      OT LONG TERM GOAL #2   Title Pt will demonstrate increased L FMC/GMC to enable indep with putting hair in ponytail.    Baseline Eval: unable    Time 12    Period Weeks    Status New    Target Date 08/10/22      OT LONG TERM GOAL #3   Title Pt will increase L FMC to enable indep with tying shoe laces.    Baseline Eval: unable to tie laces; wears slip on shoes    Time 12    Period Weeks    Status New    Target Date 08/10/22      OT LONG  TERM GOAL #4   Title Pt will increase L hand  dexterity skills to enable pt to pick up coins from table with L hand.    Baseline Eval: unable    Time 12    Period Weeks    Status New    Target Date 08/10/22      OT LONG TERM GOAL #5   Title Pt will increase L grip strength by 5 or more lbs to improve ability to hold and carry ADL supplies in L hand.    Baseline Eval: L grip 17# (R 35#)    Time 12    Period Weeks    Status New    Target Date 08/10/22             Plan - 06/02/22 1036     Clinical Impression Statement Pt making steady progress with small item pick up, working with Jamar pegs and pegboard.  Pt repeatedly puts pegs back down on table to re-grip within tips of fingers d/t still having difficulty with rotating peg within fingertips in prep for placement into board.  Pt required intermittent vc for technique and normalized movement patterns, as pt tends to lift elbow in the air when performing a [place] task at table top level.  Pt able to correct and rest elbow and forearm on table top to isolate distal movements, but required intermittent cues throughout task for carry over.  Pt will continue to benefit from skilled OT for increasing LUE FMC/GMC skills for more efficient use of LUE during daily tasks.    OT Occupational Profile and History Problem Focused Assessment - Including review of records relating to presenting problem    Occupational performance deficits (Please refer to evaluation for details): ADL's;IADL's;Work    Body Structure / Function / Physical Skills ADL;GMC;UE functional use;FMC;Decreased knowledge of use of DME;Pain;Strength;Coordination;Dexterity;IADL;Sensation;Balance    Rehab Potential Good    Clinical Decision Making Limited treatment options, no task modification necessary    Comorbidities Affecting Occupational Performance: May have comorbidities impacting occupational performance    Modification or Assistance to Complete Evaluation  No modification of tasks or assist necessary to complete eval    OT  Frequency 2x / week    OT Duration 12 weeks    OT Treatment/Interventions Self-care/ADL training;Therapeutic exercise;Therapeutic activities;Cryotherapy;Moist Heat;Neuromuscular education;DME and/or AE instruction;Manual Therapy;Splinting;Patient/family education    Plan OT focus on LUE coordination/strength training and education/splinting for L hand 3rd digit PIP triggering    OT Home Exercise Plan Not yet initiated    Consulted and Agree with Plan of Care Patient               Leta Speller, MS, OTR/L  Darleene Cleaver, OT 06/18/2022, 8:15 PM

## 2022-06-19 ENCOUNTER — Ambulatory Visit: Payer: PPO

## 2022-06-22 ENCOUNTER — Ambulatory Visit: Payer: PPO

## 2022-06-22 DIAGNOSIS — M6281 Muscle weakness (generalized): Secondary | ICD-10-CM

## 2022-06-22 DIAGNOSIS — R278 Other lack of coordination: Secondary | ICD-10-CM

## 2022-06-22 DIAGNOSIS — I6930 Unspecified sequelae of cerebral infarction: Secondary | ICD-10-CM

## 2022-06-22 NOTE — Therapy (Signed)
OUTPATIENT OCCUPATIONAL THERAPY TREATMENT NOTE   Patient Name: [name] MRN: [id number] DOB:02-14-1956, 66 y.o., female Today's Date: 06/22/2022  PCP: Dr. Birdie Sons REFERRING PROVIDER: Frann Rider, NP   OT End of Session - 06/22/22 1105     Visit Number 9    Number of Visits 24    Date for OT Re-Evaluation 08/10/22    OT Start Time 40    OT Stop Time 1145    OT Time Calculation (min) 61 min    Equipment Utilized During Treatment transport chair, SBQC    Activity Tolerance Patient tolerated treatment well    Behavior During Therapy WFL for tasks assessed/performed              Past Medical History:  Diagnosis Date   Anxiety    Arthritis    CAD (coronary artery disease)    a. 3 vessel CAD noted on coronary CTA 04/2021, s/p CABG x4   Cataract    left   Depression    Diabetes mellitus without complication ([place])    type 2   Environmental and seasonal allergies    GERD (gastroesophageal reflux disease)    Hyperlipidemia    Hypertension    Joint pain    as reported by patient   Papillary fibroelastoma of heart    of the aortic valve,  s/p AVR and tumor resection on 08/04/2021   Right thalamic infarction ([place]) 04/29/2021   S/P AVR (aortic valve replacement)    s/p AVR with resection of AV fibroelastoma on 08/04/2021   S/P CABG x 4    a. s/p CABG x4 LIMA to LAD, SVG to PDA, and Sequential SVG to OM and Diagonal on 8/   Stroke ([place]) 04/2021   admitted at [facility] - left sided weakness   Urinary incontinence    as stated by patient   Past Surgical History:  Procedure Laterality Date   AORTIC VALVE REPLACEMENT N/A 08/04/2021   Procedure: RESECTION AORTIC VALVE TUMOR;  Surgeon: [name], MD;  Location: [place];  Service: Open Heart Surgery;  Laterality: N/A;   APPLICATION OF WOUND VAC N/A 08/24/2021   Procedure: APPLICATION OF WOUND VAC;  Surgeon: Gaye Pollack, MD;  Location: [place] [facility];  Service: Thoracic;  Laterality: N/A;   APPLICATION  OF WOUND VAC N/A 08/28/2021   Procedure: WOUND VAC CHANGE;  Surgeon: [name], MD;  Location: [place];  Service: Thoracic;  Laterality: N/A;   BREAST EXCISIONAL BIOPSY Right 1999   neg   BUBBLE STUDY  04/24/2021   Procedure: BUBBLE STUDY;  Surgeon: Sueanne Margarita, MD;  Location: [place];  Service: Cardiovascular;;   CATARACT EXTRACTION  12/2011   CATARACT EXTRACTION W/PHACO Left 12/02/2016   Procedure: CATARACT EXTRACTION PHACO AND INTRAOCULAR LENS PLACEMENT ([place]);  Surgeon: Estill Cotta, MD;  Location: [facility];  Service: Ophthalmology;  Laterality: Left;  [place] 2:14 AP% 28.4 CDE 59.73 Fluid pack lot # [id number]   COLONOSCOPY WITH PROPOFOL N/A 04/13/2022   Procedure: COLONOSCOPY WITH PROPOFOL;  Surgeon: Lin Landsman, MD;  Location: [facility] ENDOSCOPY;  Service: Gastroenterology;  Laterality: N/A;   CORONARY ARTERY BYPASS GRAFT N/A 08/04/2021   Procedure: CORONARY ARTERY BYPASS GRAFTING (CABG) X  4  USING LEFT INTERNAL MAMMARY ARTERY AND RIGHT GREATER SAPHENOUS VEIN ENDOSCOPIC CONDUITS;  Surgeon: [name], MD;  Location: [place];  Service: Open Heart Surgery;  Laterality: N/A;   DIAGNOSTIC LAPAROSCOPY     EYE SURGERY  IR CT HEAD LTD  04/19/2021   IR PERCUTANEOUS ART THROMBECTOMY/INFUSION INTRACRANIAL INC DIAG ANGIO  04/19/2021       IR PERCUTANEOUS ART THROMBECTOMY/INFUSION INTRACRANIAL INC DIAG ANGIO  04/19/2021   IR US GUIDE VASC ACCESS LEFT  04/19/2021   RADIOLOGY WITH ANESTHESIA N/A 04/19/2021   Procedure: IR WITH ANESTHESIA;  Surgeon: Luanne Bras, MD;  Location: [place];  Service: Radiology;  Laterality: N/A;   STERNAL WOUND DEBRIDEMENT N/A 08/24/2021   Procedure: STERNAL WOUND DRAINAGE AND DEBRIDEMENT;  Surgeon: Gaye Pollack, MD;  Location: [place] [facility];  Service: Thoracic;  Laterality: N/A;   STERNAL WOUND DEBRIDEMENT N/A 08/28/2021   Procedure: STERNAL WOUND DEBRIDEMENT;  Surgeon: [name], MD;  Location: [place];  Service: Thoracic;   Laterality: N/A;   TEE WITHOUT CARDIOVERSION N/A 04/24/2021   Procedure: TRANSESOPHAGEAL ECHOCARDIOGRAM (TEE);  Surgeon: Sueanne Margarita, MD;  Location: [facility] ENDOSCOPY;  Service: Cardiovascular;  Laterality: N/A;   TEE WITHOUT CARDIOVERSION N/A 08/04/2021   Procedure: TRANSESOPHAGEAL ECHOCARDIOGRAM (TEE);  Surgeon: [name], MD;  Location: [place];  Service: Open Heart Surgery;  Laterality: N/A;   TOTAL HIP ARTHROPLASTY Right 04/04/2018   Procedure: TOTAL HIP ARTHROPLASTY;  Surgeon: Dereck Leep, MD;  Location: [facility];  Service: Orthopedics;  Laterality: Right;   Patient Active Problem List   Diagnosis Date Noted   Colon cancer screening    Subcutaneous nodule    Acute blood loss anemia    Diabetic peripheral neuropathy ([place])    Debility 08/11/2021   S/P CABG x 4 08/04/2021   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   Hyperkalemia    Chronic bilateral low back pain without sciatica    Leukocytosis    Hyponatremia    Essential hypertension    Slow transit constipation    Hemiparesis affecting left side as late effect of stroke ([place])    Aortic valve mass    History of CVA with residual deficit 04/19/2021   Dyslipidemia, goal LDL below 70 04/18/2021   Hypertensive urgency 04/18/2021   Recurrent major depressive disorder, in partial remission ([place]) 09/20/2020   Status post total replacement of hip 04/04/2018   Primary osteoarthritis of right hip 02/20/2018   Allergic rhinitis 10/28/2015   Acute stress disorder 08/01/2015   Anxiety 08/01/2015   Clinical depression 08/01/2015   Diabetes ([place]) 08/01/2015   Diverticulosis of colon 08/01/2015   Generalized pruritus 08/01/2015   BP (high blood pressure) 08/01/2015   Cannot sleep 08/01/2015   Adiposity 08/01/2015   Detrusor muscle hypertonia 08/01/2015   Hypercholesterolemia without hypertriglyceridemia 08/01/2015   Female stress incontinence 08/01/2015   Avitaminosis D 08/01/2015    ONSET DATE:  04/18/2021  REFERRING DIAG: hemiparesis affecting L side as late effect of CVA; dsymetria  THERAPY DIAG:  Other lack of coordination  Muscle weakness (generalized)  History of CVA with residual deficit  Rationale for Evaluation and Treatment Rehabilitation  PERTINENT HISTORY: Pt s/p R CVA on 04/18/21. Pt was initially recovering very well from a stroke standpoint with mild LUE >LLE numbness and ambulating with a cane. 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 Therapy eval's recommended CIR for decreased functional mobility - d/c to CIR on 8/29. She required incision debridement of sternal wound and application of VAC eventually discontinued on 9/29. She was discharged home on 9/30 (after a 32 day stay).  PRECAUTIONS: Fall  SUBJECTIVE: Pt reports she is still waiting for her son to set up her home paraffin bath. Pt wants to return to work - needs to be able to make change using L hand.  PAIN:  Are you having pain? No    OBJECTIVE:   TODAY'S TREATMENT:  06/22/2022 [place]: 10 min To L hand for pain management/muscle relaxation in prep for neuro re-ed.  Neuro re-ed: 35 min Participation in [facility] using the Grooved pegboard. Pt worked on grasping the grooved pegs from rounded tray and worked on Clinical cytogeneticist, moving the pegs to a vertical position in the hand to prepare for placing them in the grooved slot. Good recall to place elbow on table to minimize shoulder hiking - reports cold table is painful, improved with towel. Continues to require cues for proper technique. Increased difficulty picking up pegs while wearing trigger finger splint - removed for tasks. Multiple rest breaks to complete 19 pegs. Pt manipulated nuts and bolts positioned vertically. Used L hand to stabilize and R hand to unscrew however was unable to remove  the nuts without dropping them.   06/17/2022 [place]affin: 10 min To L hand for pain management/muscle relaxation in prep for neuro re-ed.   Neuro re-ed: Participation in [facility] with Jamar pegs and pegboard.  Used washcloth as non-skid surface for easier pick up of pegs using L hand and then worked to move pegs from horizontal to vertical position in pegboard.  Pt required intermittent vc for technique and normalized movement patterns, as pt tends to lift elbow in the air when performing a [place] task at table top level.  Pt able to correct and rest elbow and forearm on table top to isolate distal movements, but required intermittent cues throughout task.  Practiced use of L hand for object manipulation at table top; pt stood up jumbo pegs vertically on table top and practiced controlled reaching placing small glass stones on top of pegs without knocking over both.     PATIENT EDUCATION:  Education details: Continue to limit repetitive flexion of L 3rd digit and wear splint with activity to prevent triggering.  Person educated: Patient Education method: Explanation and Verbal cues Education comprehension: verbalized understanding, returned demonstration, and needs further education   HOME EXERCISE PROGRAM FMC/GMC activities for LUE    OT Short Term Goals -        OT SHORT TERM GOAL #1   Title Pt will be indep to perform LUE HEP for coordination and strengthening.    Baseline Eval: not yet initiated    Time 6    Period Weeks    Status New    Target Date 06/29/22      OT SHORT TERM GOAL #2   Title Pt be indep with joint protection and pain management strategies to limit triggering of L hand 3rd digit PIP.    Baseline Eval: Trigger finger splint recommended, initiated joint protection educ    Time 6    Period Weeks    Status New    Target Date 06/29/22              OT Long Term Goals -       OT LONG TERM GOAL #1   Title Pt will increase FOTO by 1-2 points to indicate improvement in  perceived functional indep with daily tasks.    Baseline Eval: FOTO 60    Time 12    Period Weeks    Status New    Target  Date 08/10/22      OT LONG TERM GOAL #2   Title Pt will demonstrate increased L FMC/GMC to enable indep with putting hair in ponytail.    Baseline Eval: unable    Time 12    Period Weeks    Status New    Target Date 08/10/22      OT LONG TERM GOAL #3   Title Pt will increase L FMC to enable indep with tying shoe laces.    Baseline Eval: unable to tie laces; wears slip on shoes    Time 12    Period Weeks    Status New    Target Date 08/10/22      OT LONG TERM GOAL #4   Title Pt will increase L hand dexterity skills to enable pt to pick up coins from table with L hand.    Baseline Eval: unable    Time 12    Period Weeks    Status New    Target Date 08/10/22      OT LONG TERM GOAL #5   Title Pt will increase L grip strength by 5 or more lbs to improve ability to hold and carry ADL supplies in L hand.    Baseline Eval: L grip 17# (R 35#)    Time 12    Period Weeks    Status New    Target Date 08/10/22             Plan - 06/02/22 1036     Clinical Impression Statement Pt improving with elbow positioning during fine motor tasks. Continues to have difficulty with rotating peg within fingertips in prep for placement into board. Repeated dropping of large nuts/bolts, improved with rest breaks. Pt will continue to benefit from skilled OT for increasing LUE FMC/GMC skills for more efficient use of LUE during daily tasks.    OT Occupational Profile and History Problem Focused Assessment - Including review of records relating to presenting problem    Occupational performance deficits (Please refer to evaluation for details): ADL's;IADL's;Work    Body Structure / Function / Physical Skills ADL;GMC;UE functional use;FMC;Decreased knowledge of use of DME;Pain;Strength;Coordination;Dexterity;IADL;Sensation;Balance    Rehab Potential Good    Clinical Decision  Making Limited treatment options, no task modification necessary    Comorbidities Affecting Occupational Performance: May have comorbidities impacting occupational performance    Modification or Assistance to Complete Evaluation  No modification of tasks or assist necessary to complete eval    OT Frequency 2x / week    OT Duration 12 weeks    OT Treatment/Interventions Self-care/ADL training;Therapeutic exercise;Therapeutic activities;Cryotherapy;Moist Heat;Neuromuscular education;DME and/or AE instruction;Manual Therapy;Splinting;Patient/family education    Plan OT focus on LUE coordination/strength training and education/splinting for L hand 3rd digit PIP triggering    OT Home Exercise Plan Not yet initiated    Consulted and Agree with Plan of Care Patient             Dessie Coma, M.S. OTR/L  06/22/22, 11:31 AM  ascom [phone]    Leonides Cave, OT 06/22/2022, 11:31 AM

## 2022-06-25 ENCOUNTER — Encounter: Payer: Self-pay | Admitting: Occupational Therapy

## 2022-06-25 ENCOUNTER — Ambulatory Visit: Payer: PPO | Admitting: Occupational Therapy

## 2022-06-25 NOTE — Therapy (Signed)
[place] MAIN [facility] SERVICES [address] [place], [place], [address] Phone: [phone]   Fax:  [phone]  Patient Details  Name: [name] MRN: [id number] Date of Birth: [birth year] Referring Provider:  No ref. provider found  Encounter Date: 06/25/2022  Pt. Did not arrive for her OT appointment this morning. An attempt was made to reach out to the pt. via telephone, however the pt.'s voicemail was full.    Harrel Carina, MS, OTR/L 06/25/2022, 12:19 PM  [place] MAIN [facility] SERVICES [address] [place], [place], [address] Phone: [phone]   Fax:  [phone]

## 2022-06-25 NOTE — Therapy (Unsigned)
[place] MAIN [facility] SERVICES [address] [place], [place], [address] Phone: [phone]   Fax:  [phone]  Patient Details  Name: [name] MRN: [id number] Date of Birth: [birth year] Referring Provider:  No ref. provider found  Encounter Date: 06/25/2022  Pt. Returned a call to the OT gym. Pt. Reports that she thought her appointment was tomorrow, and that she has a hard time seeing the appointment times. The next  appointment time was reviewed, and clarified with the pt.   Harrel Carina, MS, OTR/L   Harrel Carina, OT 06/25/2022, 12:55 PM  [place] MAIN [facility] SERVICES [address] [place], [place], [address] Phone: [phone]   Fax:  [phone]

## 2022-06-30 ENCOUNTER — Ambulatory Visit: Payer: PPO

## 2022-06-30 DIAGNOSIS — M6281 Muscle weakness (generalized): Secondary | ICD-10-CM

## 2022-06-30 DIAGNOSIS — R278 Other lack of coordination: Secondary | ICD-10-CM

## 2022-06-30 DIAGNOSIS — I6381 Other cerebral infarction due to occlusion or stenosis of small artery: Secondary | ICD-10-CM

## 2022-07-01 ENCOUNTER — Other Ambulatory Visit (HOSPITAL_COMMUNITY): Payer: Self-pay

## 2022-07-01 NOTE — Therapy (Signed)
OUTPATIENT OCCUPATIONAL THERAPY TREATMENT/PROGRESS NOTE Reporting period 05/19/22-06/30/22   Patient Name: [name] MRN: [id number] DOB:24-Sep-1956, 66 y.o., female Today's Date: 07/01/2022  PCP: Dr. Birdie Sons REFERRING PROVIDER: Frann Rider, NP   OT End of Session - 07/01/22 0817     Visit Number 10    Number of Visits 24    Date for OT Re-Evaluation 08/10/22    Authorization Time Period Reporting period beginning 05/19/22-06/30/22    OT Start Time 61    OT Stop Time 1145    OT Time Calculation (min) 47 min    Equipment Utilized During Treatment transport chair, SBQC    Activity Tolerance Patient tolerated treatment well    Behavior During Therapy WFL for tasks assessed/performed              Past Medical History:  Diagnosis Date   Anxiety    Arthritis    CAD (coronary artery disease)    a. 3 vessel CAD noted on coronary CTA 04/2021, s/p CABG x4   Cataract    left   Depression    Diabetes mellitus without complication ([place])    type 2   Environmental and seasonal allergies    GERD (gastroesophageal reflux disease)    Hyperlipidemia    Hypertension    Joint pain    as reported by patient   Papillary fibroelastoma of heart    of the aortic valve,  s/p AVR and tumor resection on 08/04/2021   Right thalamic infarction ([place]) 04/29/2021   S/P AVR (aortic valve replacement)    s/p AVR with resection of AV fibroelastoma on 08/04/2021   S/P CABG x 4    a. s/p CABG x4 LIMA to LAD, SVG to PDA, and Sequential SVG to OM and Diagonal on 8/   Stroke ([place]) 04/2021   admitted at [facility] - left sided weakness   Urinary incontinence    as stated by patient   Past Surgical History:  Procedure Laterality Date   AORTIC VALVE REPLACEMENT N/A 08/04/2021   Procedure: RESECTION AORTIC VALVE TUMOR;  Surgeon: [name], MD;  Location: [place];  Service: Open Heart Surgery;  Laterality: N/A;   APPLICATION OF WOUND VAC N/A 08/24/2021   Procedure: APPLICATION OF  WOUND VAC;  Surgeon: Gaye Pollack, MD;  Location: [place] [facility];  Service: Thoracic;  Laterality: N/A;   APPLICATION OF WOUND VAC N/A 08/28/2021   Procedure: WOUND VAC CHANGE;  Surgeon: [name], MD;  Location: [place];  Service: Thoracic;  Laterality: N/A;   BREAST EXCISIONAL BIOPSY Right 1999   neg   BUBBLE STUDY  04/24/2021   Procedure: BUBBLE STUDY;  Surgeon: Sueanne Margarita, MD;  Location: [place];  Service: Cardiovascular;;   CATARACT EXTRACTION  12/2011   CATARACT EXTRACTION W/PHACO Left 12/02/2016   Procedure: CATARACT EXTRACTION PHACO AND INTRAOCULAR LENS PLACEMENT ([place]);  Surgeon: Estill Cotta, MD;  Location: [facility];  Service: Ophthalmology;  Laterality: Left;  [place] 2:14 AP% 28.4 CDE 59.73 Fluid pack lot # [id number]   COLONOSCOPY WITH PROPOFOL N/A 04/13/2022   Procedure: COLONOSCOPY WITH PROPOFOL;  Surgeon: Lin Landsman, MD;  Location: [facility];  Service: Gastroenterology;  Laterality: N/A;   CORONARY ARTERY BYPASS GRAFT N/A 08/04/2021   Procedure: CORONARY ARTERY BYPASS GRAFTING (CABG) X  4  USING LEFT INTERNAL MAMMARY ARTERY AND RIGHT GREATER SAPHENOUS VEIN ENDOSCOPIC CONDUITS;  Surgeon: [name], MD;  Location: [place];  Service: Open Heart Surgery;  Laterality: N/A;  DIAGNOSTIC LAPAROSCOPY     EYE SURGERY     IR CT HEAD LTD  04/19/2021   IR PERCUTANEOUS ART THROMBECTOMY/INFUSION INTRACRANIAL INC DIAG ANGIO  04/19/2021       IR PERCUTANEOUS ART THROMBECTOMY/INFUSION INTRACRANIAL INC DIAG ANGIO  04/19/2021   IR US GUIDE VASC ACCESS LEFT  04/19/2021   RADIOLOGY WITH ANESTHESIA N/A 04/19/2021   Procedure: IR WITH ANESTHESIA;  Surgeon: Luanne Bras, MD;  Location: [place];  Service: Radiology;  Laterality: N/A;   STERNAL WOUND DEBRIDEMENT N/A 08/24/2021   Procedure: STERNAL WOUND DRAINAGE AND DEBRIDEMENT;  Surgeon: Gaye Pollack, MD;  Location: [place] [facility];  Service: Thoracic;  Laterality: N/A;   STERNAL WOUND DEBRIDEMENT N/A 08/28/2021    Procedure: STERNAL WOUND DEBRIDEMENT;  Surgeon: [name], MD;  Location: [place];  Service: Thoracic;  Laterality: N/A;   TEE WITHOUT CARDIOVERSION N/A 04/24/2021   Procedure: TRANSESOPHAGEAL ECHOCARDIOGRAM (TEE);  Surgeon: Sueanne Margarita, MD;  Location: [facility];  Service: Cardiovascular;  Laterality: N/A;   TEE WITHOUT CARDIOVERSION N/A 08/04/2021   Procedure: TRANSESOPHAGEAL ECHOCARDIOGRAM (TEE);  Surgeon: [name], MD;  Location: [place];  Service: Open Heart Surgery;  Laterality: N/A;   TOTAL HIP ARTHROPLASTY Right 04/04/2018   Procedure: TOTAL HIP ARTHROPLASTY;  Surgeon: Dereck Leep, MD;  Location: [facility];  Service: Orthopedics;  Laterality: Right;   Patient Active Problem List   Diagnosis Date Noted   Colon cancer screening    Subcutaneous nodule    Acute blood loss anemia    Diabetic peripheral neuropathy ([place])    Debility 08/11/2021   S/P CABG x 4 08/04/2021   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   Hyperkalemia    Chronic bilateral low back pain without sciatica    Leukocytosis    Hyponatremia    Essential hypertension    Slow transit constipation    Hemiparesis affecting left side as late effect of stroke ([place])    Aortic valve mass    History of CVA with residual deficit 04/19/2021   Dyslipidemia, goal LDL below 70 04/18/2021   Hypertensive urgency 04/18/2021   Recurrent major depressive disorder, in partial remission ([place]) 09/20/2020   Status post total replacement of hip 04/04/2018   Primary osteoarthritis of right hip 02/20/2018   Allergic rhinitis 10/28/2015   Acute stress disorder 08/01/2015   Anxiety 08/01/2015   Clinical depression 08/01/2015   Diabetes ([place]) 08/01/2015   Diverticulosis of colon 08/01/2015   Generalized pruritus 08/01/2015   BP (high blood pressure) 08/01/2015   Cannot sleep 08/01/2015   Adiposity 08/01/2015   Detrusor muscle hypertonia 08/01/2015   Hypercholesterolemia without hypertriglyceridemia  08/01/2015   Female stress incontinence 08/01/2015   Avitaminosis D 08/01/2015    ONSET DATE: 04/18/2021  REFERRING DIAG: hemiparesis affecting L side as late effect of CVA; dsymetria  THERAPY DIAG:  Muscle weakness (generalized)  Other lack of coordination  Right thalamic infarction [facility])  Rationale for Evaluation and Treatment Rehabilitation  PERTINENT HISTORY: Pt s/p R CVA on 04/18/21. Pt was initially recovering very well from a stroke standpoint with mild LUE >LLE numbness and ambulating with a cane. 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 Therapy eval's recommended CIR for decreased functional mobility - d/c to CIR on 8/29. She required incision debridement of sternal wound and application of VAC eventually discontinued on 9/29. She was  discharged home on 9/30 (after a 32 day stay).  PRECAUTIONS: Fall  SUBJECTIVE: Pt states her L LF is still quite painful, but that she continues to wear her splint at home with activity.  PAIN:  Are you having pain? Yes; L LF, 5/10 pain with gripping activities.  Heat, rest, and splinting help to decrease pain.    OBJECTIVE:   [facility] OT Assessment - 07/01/22 0001       Observation/Other Assessments   Focus on Therapeutic Outcomes (FOTO)  61      Coordination   Gross Motor Movements are Fluid and Coordinated No    Fine Motor Movements are Fluid and Coordinated No    Right 9 Hole Peg Test 31 sec    Left 9 Hole Peg Test 3 min 44 sec   improved from 5 min 38 sec     Hand Function   Right Hand Grip (lbs) 35    Right Hand Lateral Pinch 10 lbs    Right Hand 3 Point Pinch 7 lbs    Left Hand Grip (lbs) 10   decline (L LF pain from triggering limiting grip strength)   Left Hand Lateral Pinch 6 lbs    Left 3 point pinch 5 lbs   finger slipping            TODAY'S TREATMENT:   06/30/2022 [place]: 10 min To L hand for pain management/muscle relaxation in prep for therapeutic exercises.  Therapeutic Exercise: Objective measures taken and goals updated.  Issued soft blue theraputty and instructed in pinching, intrinsic strengthening, and digging coins out of putty.  Written HEP issued for putty exercises and instructed pt to avoid any exercises that caused pain to L LF and avoid repetitive gripping on the handout at this time.  Also placed slit in putty top to mimic bank slot and practiced picking coins up from table with L hand and placing into slot.     PATIENT EDUCATION:  Education details: Continue to limit repetitive flexion of L 3rd digit and wear splint with activity to prevent triggering.  Person educated: Patient Education method: Explanation and Verbal cues Education comprehension: verbalized understanding, returned demonstration, and needs further education   HOME EXERCISE PROGRAM FMC/GMC activities for LUE    OT Short Term Goals -        OT SHORT TERM GOAL #1   Title Pt will be indep to perform LUE HEP for coordination and strengthening.    Baseline Eval: not yet initiated; 06/30/22: pt remains consistent with HEP and avoiding repetitive gripping that causes pain in L LF.   Time 6    Period Weeks    Status ongoing   Target Date 06/29/22      OT SHORT TERM GOAL #2   Title Pt to be indep with joint protection and pain management strategies to limit triggering of L hand 3rd digit PIP.    Baseline Eval: Trigger finger splint recommended, initiated joint protection educ; 06/30/22: pt is consistent to wear her trigger finger splint and avoids repetitive gripping which causes pain to L LF PIP joint, though pt is still experiencing moderate pain in this joint with occasional triggering.  Pt plans to follow up with MD next week.   Time 6    Period Weeks    Status ongoing   Target Date 06/29/22              OT Long Term Goals -       OT LONG  TERM  GOAL #1   Title Pt will increase FOTO by 1-2 points to indicate improvement in perceived functional indep with daily tasks.    Baseline Eval: FOTO 60; 06/30/22: 61   Time 12    Period Weeks    Status ongoing   Target Date 08/10/22      OT LONG TERM GOAL #2   Title Pt will demonstrate increased L FMC/GMC to enable indep with putting hair in ponytail.    Baseline Eval: unable; 06/30/22: pt can hold her hair in a ponytail with BUEs but can not yet manage the rubber band.   Time 12    Period Weeks    Status ongoing   Target Date 08/10/22      OT LONG TERM GOAL #3   Title Pt will increase L FMC to enable indep with tying shoe laces.    Baseline Eval: unable to tie laces; wears slip on shoes; 06/30/22: pt still wearing slip on shoes   Time 12    Period Weeks    Status ongoing   Target Date 08/10/22      OT LONG TERM GOAL #4   Title Pt will increase L hand dexterity skills to enable pt to pick up coins from table with L hand.    Baseline Eval: unable; 06/30/22: pt can pick up coins from table with extra time.  Dexterity skills improving as noted by improved 9 hole peg test from 5 min 38 sec to 3 min 44 sec on the L.   Time 12    Period Weeks    Status ongoing   Target Date 08/10/22      OT LONG TERM GOAL #5   Title Pt will increase L grip strength by 5 or more lbs to improve ability to hold and carry ADL supplies in L hand.    Baseline Eval: L grip 17# (R 35#); 06/30/22: L grip 10# (pain in L LF significantly limiting grip as finger continues to trigger.   Time 12    Period Weeks    Status ongoing   Target Date 08/10/22             Plan - 06/02/22 1036     Clinical Impression Statement Pt making steady gains with LUE gross and [facility] skills and pt continues to report that she is using her LUE more with daily tasks to reach for, hold, and stabilize ADL supplies.  Pt states that she is now folding towels, which she was unable to manage before the start of therapy.  Pt's 9 hole peg  test showed significant improved from 5 min 38 sec to 3 min 44 sec on the L hand.  L grip continues to be limited d/t having to avoid repetitive gripping to avoid triggering and pain of the LF PIP in this hand.  Pt is consistent with her joint protection strategies and wearing her trigger finger splint with activity.  Though improving, moderate-severe dysmetria in LUE continues to limit efficient use of LUE for self care tasks.  Pt will continue to benefit from skilled OT for increasing LUE FMC/GMC skills for more efficient use of LUE during daily tasks.    OT Occupational Profile and History Problem Focused Assessment - Including review of records relating to presenting problem    Occupational performance deficits (Please refer to evaluation for details): ADL's;IADL's;Work    Body Structure / Function / Physical Skills ADL;GMC;UE functional use;FMC;Decreased knowledge of use of DME;Pain;Strength;Coordination;Dexterity;IADL;Sensation;Balance    Rehab Potential Good  Clinical Decision Making Limited treatment options, no task modification necessary    Comorbidities Affecting Occupational Performance: May have comorbidities impacting occupational performance    Modification or Assistance to Complete Evaluation  No modification of tasks or assist necessary to complete eval    OT Frequency 2x / week    OT Duration 12 weeks    OT Treatment/Interventions Self-care/ADL training;Therapeutic exercise;Therapeutic activities;Cryotherapy;Moist Heat;Neuromuscular education;DME and/or AE instruction;Manual Therapy;Splinting;Patient/family education    Plan OT focus on LUE coordination/strength training and education/splinting for L hand 3rd digit PIP triggering    OT Home Exercise Plan Not yet initiated    Consulted and Agree with Plan of Care Patient            Leta Speller, MS, OTR/L   Darleene Cleaver, OT 07/01/2022, 8:33 AM

## 2022-07-01 NOTE — Progress Notes (Signed)
Subjective:    Patient ID: [name], female    DOB: 1956-08-17, 66 y.o.   MRN: [id number]  HPI  [name] is a 66 year old woman who presents for follow-up s/p CABG and stroke  1) Trigger finger -she had an injection in the past and this really helped -she would like a repeat injection  2) CVA -She has been doing well at home but has not yet received and PT, OT, or nursing services. She says that SW is aware and working on this.  -she needs assistance with disability paperwork.   3) s/p CABG -incision wound healed  4) Anxiety -she takes Xanax.  -it is only written for 45 days.  -she was started on this when she was still working.     She has been doing her dressing changes and inspecting her wound daily  She does not need any refills of her medications.  BP is currently well controlled  Has not been walking as much as when she was in the hospital.  She has struggled a lot with her disability insurance and this has impacted her financially. She shares some of these concerns with me today    Pain Inventory Average Pain 0 Pain Right Now 0 My pain is  no pain  In the last 24 hours, has pain interfered with the following? General activity 0 Relation with others 0 Enjoyment of life 0 What TIME of day is your pain at its worst? no pain Sleep (in general) Good  Pain is worse with: no pain Pain improves with: no pain Relief from Meds: no pain  walk with assistance use a walker  disabled: date disabled short term on leave from [employer];  weakness trouble walking anxiety  Any changes since last visit?  no  Any changes since last visit?  no    Family History  Problem Relation Age of Onset   Bone cancer Brother    Aneurysm Mother        brain   Diabetes Father    CAD Father    Heart failure Father    Colon cancer Father    Cancer Father    Alzheimer's disease Paternal Grandmother    Dementia Paternal Grandmother    Mental illness  Paternal Grandfather    Healthy Daughter    Healthy Son    Breast cancer Neg Hx    Social History   Socioeconomic History   Marital status: Widowed    Spouse name: Not on file   Number of children: 2   Years of education: Not on file   Highest education level: 12th grade  Occupational History    Employer: [employer]  Tobacco Use   Smoking status: Former    Packs/day: 1.50    Years: 25.00    Total pack years: 37.50    Types: Cigarettes    Quit date: 03/24/2007    Years since quitting: 15.2   Smokeless tobacco: Never  Vaping Use   Vaping Use: Never used  Substance and Sexual Activity   Alcohol use: Not Currently   Drug use: No   Sexual activity: Not Currently    Birth control/protection: Post-menopausal  Other Topics Concern   Not on file  Social History Narrative   Not on file   Social Determinants of Health   Financial Resource Strain: Not on file  Food Insecurity: Not on file  Transportation Needs: Not on file  Physical Activity: Not on file  Stress: Not  on file  Social Connections: Not on file   Past Surgical History:  Procedure Laterality Date   AORTIC VALVE REPLACEMENT N/A 08/04/2021   Procedure: RESECTION AORTIC VALVE TUMOR;  Surgeon: [name], MD;  Location: [place];  Service: Open Heart Surgery;  Laterality: N/A;   APPLICATION OF WOUND VAC N/A 08/24/2021   Procedure: APPLICATION OF WOUND VAC;  Surgeon: Gaye Pollack, MD;  Location: [facility];  Service: Thoracic;  Laterality: N/A;   APPLICATION OF WOUND VAC N/A 08/28/2021   Procedure: WOUND VAC CHANGE;  Surgeon: [name], MD;  Location: [facility];  Service: Thoracic;  Laterality: N/A;   BREAST EXCISIONAL BIOPSY Right 1999   neg   BUBBLE STUDY  04/24/2021   Procedure: BUBBLE STUDY;  Surgeon: Sueanne Margarita, MD;  Location: [facility];  Service: Cardiovascular;;   CATARACT EXTRACTION  12/2011   CATARACT EXTRACTION W/PHACO Left 12/02/2016   Procedure: CATARACT EXTRACTION PHACO AND  INTRAOCULAR LENS PLACEMENT ([place]);  Surgeon: Estill Cotta, MD;  Location: [facility];  Service: Ophthalmology;  Laterality: Left;  [place] 2:14 AP% 28.4 CDE 59.73 Fluid pack lot # [id number]   COLONOSCOPY WITH PROPOFOL N/A 04/13/2022   Procedure: COLONOSCOPY WITH PROPOFOL;  Surgeon: Lin Landsman, MD;  [facility];  Service: Gastroenterology;  Laterality: N/A;   CORONARY ARTERY BYPASS GRAFT N/A 08/04/2021   Procedure: CORONARY ARTERY BYPASS GRAFTING (CABG) X  4  USING LEFT INTERNAL MAMMARY ARTERY AND RIGHT GREATER SAPHENOUS VEIN ENDOSCOPIC CONDUITS;  Surgeon: [name], MD;  Location: [place];  Service: Open Heart Surgery;  Laterality: N/A;   DIAGNOSTIC LAPAROSCOPY     EYE SURGERY     IR CT HEAD LTD  04/19/2021   IR PERCUTANEOUS ART THROMBECTOMY/INFUSION INTRACRANIAL INC DIAG ANGIO  04/19/2021       IR PERCUTANEOUS ART THROMBECTOMY/INFUSION INTRACRANIAL INC DIAG ANGIO  04/19/2021   IR US GUIDE VASC ACCESS LEFT  04/19/2021   RADIOLOGY WITH ANESTHESIA N/A 04/19/2021   Procedure: IR WITH ANESTHESIA;  Surgeon: Luanne Bras, MD;  Location: [place];  Service: Radiology;  Laterality: N/A;   STERNAL WOUND DEBRIDEMENT N/A 08/24/2021   Procedure: STERNAL WOUND DRAINAGE AND DEBRIDEMENT;  Surgeon: Gaye Pollack, MD;  Location: [place] [facility];  Service: Thoracic;  Laterality: N/A;   STERNAL WOUND DEBRIDEMENT N/A 08/28/2021   Procedure: STERNAL WOUND DEBRIDEMENT;  Surgeon: [name], MD;  Location: [place];  Service: Thoracic;  Laterality: N/A;   TEE WITHOUT CARDIOVERSION N/A 04/24/2021   Procedure: TRANSESOPHAGEAL ECHOCARDIOGRAM (TEE);  Surgeon: Sueanne Margarita, MD;  Location: [facility];  Service: Cardiovascular;  Laterality: N/A;   TEE WITHOUT CARDIOVERSION N/A 08/04/2021   Procedure: TRANSESOPHAGEAL ECHOCARDIOGRAM (TEE);  Surgeon: [name], MD;  Location: [place];  Service: Open Heart Surgery;  Laterality: N/A;   TOTAL HIP ARTHROPLASTY Right 04/04/2018    Procedure: TOTAL HIP ARTHROPLASTY;  Surgeon: Dereck Leep, MD;  Location: [facility];  Service: Orthopedics;  Laterality: Right;   Past Medical History:  Diagnosis Date   Anxiety    Arthritis    CAD (coronary artery disease)    a. 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 s/p CABG x4 LIMA to LAD, SVG to PDA, and Sequential SVG to OM and Diagonal on 8/   Stroke ([place]) 04/2021   admitted at [facility] - left sided weakness   Urinary incontinence    as stated by patient   There were no vitals taken for this visit.  Opioid Risk Score:   Fall Risk Score:  `1  Depression screen PHQ 2/9     03/09/2022    1:27 PM 09/26/2021   12:10 PM 03/20/2021   10:30 AM 09/20/2020   10:22 AM 08/24/2019    2:10 PM 03/24/2018    2:57 PM 07/15/2017    1:21 PM  Depression screen PHQ 2/9  Decreased Interest '1 1 1 '$ 0 '1 1 1  '$ Down, Depressed, Hopeless '1 1 1 1 '$ 0 1 1  PHQ - 2 Score '2 2 2 1 1 2 2  '$ Altered sleeping 2 0 0 1 1 0 0  Tired, decreased energy '2 2 1 1 1 1 1  '$ Change in appetite '1 3 1 '$ 0 0 0 1  Feeling bad or failure about yourself  2 1 0 0 0 1 0  Trouble concentrating 0 0 0 0 0 0 0  Moving slowly or fidgety/restless 0 0 1 0 0 0 0  Suicidal thoughts 1 0 0 1 0 0 0  PHQ-9 Score '10 8 5 4 3 4 4  '$ Difficult doing work/chores Somewhat difficult  Not difficult at all Not difficult at all Not difficult at all Somewhat difficult Not difficult at all     Review of Systems  Constitutional:   Positive for unexpected weight change.       Wt loss  HENT: Negative.    Eyes: Negative.   Respiratory: Negative.    Cardiovascular: Negative.   Gastrointestinal: Negative.   Endocrine: Negative.   Genitourinary: Negative.   Musculoskeletal:  Positive for gait problem.  Skin: Negative.   Allergic/Immunologic: Negative.   Neurological:  Positive for weakness.  Hematological: Negative.   Psychiatric/Behavioral:  Positive for dysphoric mood. The patient is nervous/anxious.   All other systems reviewed and are negative.      Objective:   Physical Exam Gen: no distress, normal appearing HEENT: oral mucosa pink and moist, NCAT Cardio: Reg rate Chest: normal effort, normal rate of breathing Abd: soft, non-distended Ext: no edema Psych: pleasant, normal affect Skin: intact Neuro: Alert and oriented Musculoskeletal: left sided weakness, trigger finger left third digit       Assessment & Plan:   1) s/p CABG -provided with handicap placard -referred to cardiology -messaged SW regarding getting her outpatient therapies -would examined- recommended continued daily wound inspection -she has struggled with getting disability payments and this has impacted her financially, discussed this with her today, provided a note that reflects all the conditions for which she was treated while she was she was inpatient rehab as she is having to argue to get coverage for inpatient rehab as well  2) Left sided neuropathic pain -Discussed benefits of exercise in reducing pain. Recommend topical CBD oil- discussed its benefits in reducing inflammation, pain, insomnia, and anxiety.  -Discussed that CBD  oil differs from marijuana in that it does not contain THC- the substance that causes euphoria.  -Commended on stopping Gabapentin -Discussed that it is made from the hemp plant.  -It has been used for thousands of years -preliminary research suggests that if may be able to shrink cancerous tumors,  stop plaque formation in Alzheimer's Disease, and slow the progress of brain disease from concussions.  -Additional benefits that have been demonstrated in studies include improved nausea, indigestion, and brain health, and reduced seizures.  -In a survey 92% of patients who tried medical cannabis felt it improved symptoms such as chronic pain, arthritis, migraines, and cancer.    -Discussed following foods that may reduce pain: 1) Ginger (especially studied for arthritis)- reduce leukotriene production to decrease inflammation 2) Blueberries- high in phytonutrients that decrease inflammation 3) Salmon- marine omega-3s reduce joint swelling and pain 4) Pumpkin seeds- reduce inflammation 5) dark chocolate- reduces inflammation 6) turmeric- reduces inflammation 7) tart cherries - reduce pain and stiffness 8) extra virgin olive oil - its compound olecanthal helps to block prostaglandins  9) chili peppers- can be eaten or applied topically via capsaicin 10) mint- helpful for headache, muscle aches, joint pain, and itching 11) garlic- reduces inflammation  Link to further information on diet for chronic pain: [email or web address]   3) HTN: -BP is well controlled -Advised checking BP daily at home and logging results to bring into follow-up appointment with her PCP and myself. -Reviewed BP meds today.  -Advised regarding healthy foods that can help lower blood pressure and provided with a list: 1) citrus foods- high in vitamins and minerals 2) salmon and other fatty fish - reduces inflammation and oxylipins 3) swiss chard (leafy green)- high level of nitrates 4) pumpkin seeds- one of the best natural sources of magnesium 5) Beans and lentils- high in fiber, magnesium, and potassium 6) Berries- high in flavonoids 7) Amaranth (whole grain, can be cooked similarly to rice and oats)- high in magnesium and fiber 8) Pistachios-  even more effective at reducing BP than other nuts 9) Carrots- high in phenolic compounds that relax blood vessels and reduce inflammation 10) Celery- contain phthalides that relax tissues of arterial walls 11) Tomatoes- can also improve cholesterol and reduce risk of heart disease 12) Broccoli- good source of magnesium, calcium, and potassium 13) Greek yogurt: high in potassium and calcium 14) Herbs and spices: Celery seed, cilantro, saffron, lemongrass, black cumin, ginseng, cinnamon, cardamom, sweet basil, and ginger 15) Chia and flax seeds- also help to lower cholesterol and blood sugar 16) Beets- high levels of nitrates that relax blood vessels  17) spinach and bananas- high in potassium  -Provided lise of supplements that can help with hypertension:  1) magnesium: one high quality brand is Bioptemizers since it contains all 7 types of magnesium, otherwise over the counter magnesium gluconate '400mg'$  is a good option 2) B vitamins 3) vitamin D 4) potassium 5) CoQ10 6) L-arginine 7) Vitamin C 8) Beetroot -Educated that goal BP is 120/80. -Made goal to incorporate some of the above foods into diet.     4) Diabetes: -check CBGs daily, log, and bring log to follow-up appointment -avoid sugar, bread, pasta, rice -avoid snacking -perform daily foot exam and at least annual eye exam -try to incorporate into your diet some of the following foods which are good for diabetes: 1) cinnamon- imitates effects of insulin, increasing glucose transport into cells ([place] or [place] cinnamon is best, least processed) 2) nuts- can slow down the blood sugar  response of carbohydrate rich foods 3) oatmeal- contains and anti-inflammatory compound avenanthramide 4) whole-milk yogurt (best types are no sugar, [place] yogurt, or goat/sheep yogurt) 5) beans- high in protein, fiber, and vitamins, low glycemic index 6) broccoli- great source of vitamin A and C 7) quinoa- higher in protein and fiber than  other grains 8) spinach- high in vitamin A, fiber, and protein 9) olive oil- reduces glucose levels, LDL, and triglycerides 10) salmon- excellent amount of omega-3-fatty acids 11) walnuts- rich in antioxidants 12) apples- high in fiber and quercetin 13) carrots- highly nutritious with low impact on blood sugar 14) eggs- improve HDL (good cholesterol), high in protein, keep you satiated 15) turmeric: improves blood sugars, cardiovascular disease, and protects kidney health 16) garlic: improves blood sugar, blood pressure, pain 17) tomatoes: highly nutritious with low impact on blood sugar   5) Trigger finger -referred to sports medicine  6) Left hand weakness: -referred for Vivistim  7) Anxiety: -Renewed Xanax 0.'5mg'$  BID -Discussed exercise and meditation as tools to decrease anxiety. -Recommended Down Dog Yoga app -Discussed spending time outdoors. -Discussed positive re-framing of anxiety.  -Discussed the following foods that have been show to reduce anxiety: 1) [place] nuts, mushrooms, soy beans due to their high selenium content. Upper limit of toxicity of selenium is 42mg/day so no more than 3-4 bBolivianuts per day.  2) Fatty fish such as salmon, mackerel, sardines, trout, and herring- high in omega-3 fatty acids 3) Eggs- increases serotonin and dopamine 4) Pumpkin seeds- high in omega-3 fatty acids 5) dark chocolate- high in flavanols that increase blood flow to brain 6) turmeric- take with black pepper to increase absorption 7) chamomile tea- antioxidant and anti-inflammatory properties 8) yogurt without sugar- supports gut-brain axis 9) green tea- contains L- theanine 10) blueberries- high in vitamin C and antioxidants 11) t[place] high in tryptophan which gets converted to serotonin 12) bell peppers- rich in vitamin C and antioxidants 13) citrus fruits- rich in vitamin C and antioxidants 14) almonds- high in vitamin E and healthy fats 15) chia seeds- high in omega-3  fatty acids

## 2022-07-02 ENCOUNTER — Other Ambulatory Visit (HOSPITAL_COMMUNITY): Payer: Self-pay

## 2022-07-02 DIAGNOSIS — H40153 Residual stage of open-angle glaucoma, bilateral: Secondary | ICD-10-CM | POA: Diagnosis not present

## 2022-07-03 ENCOUNTER — Other Ambulatory Visit (HOSPITAL_COMMUNITY): Payer: Self-pay

## 2022-07-03 ENCOUNTER — Ambulatory Visit: Payer: PPO

## 2022-07-03 DIAGNOSIS — I6381 Other cerebral infarction due to occlusion or stenosis of small artery: Secondary | ICD-10-CM

## 2022-07-03 DIAGNOSIS — M6281 Muscle weakness (generalized): Secondary | ICD-10-CM

## 2022-07-03 DIAGNOSIS — R278 Other lack of coordination: Secondary | ICD-10-CM

## 2022-07-06 NOTE — Therapy (Signed)
OUTPATIENT OCCUPATIONAL THERAPY TREATMENT NOTE    Patient Name: [name] MRN: [id number] DOB:April 05, 1956, 66 y.o., female Today's Date: 07/06/2022  PCP: Dr. Birdie Sons REFERRING PROVIDER: Frann Rider, NP   OT End of Session - 07/06/22 0921     Visit Number 11    Number of Visits 24    Date for OT Re-Evaluation 08/10/22    Authorization Time Period Reporting period beginning 06/30/22    OT Start Time 60    OT Stop Time 1145    OT Time Calculation (min) 47 min    Equipment Utilized During Treatment transport chair, SBQC    Activity Tolerance Patient tolerated treatment well    Behavior During Therapy WFL for tasks assessed/performed              Past Medical History:  Diagnosis Date   Anxiety    Arthritis    CAD (coronary artery disease)    a. 3 vessel CAD noted on coronary CTA 04/2021, s/p CABG x4   Cataract    left   Depression    Diabetes mellitus without complication ([place])    type 2   Environmental and seasonal allergies    GERD (gastroesophageal reflux disease)    Hyperlipidemia    Hypertension    Joint pain    as reported by patient   Papillary fibroelastoma of heart    of the aortic valve,  s/p AVR and tumor resection on 08/04/2021   Right thalamic infarction ([place]) 04/29/2021   S/P AVR (aortic valve replacement)    s/p AVR with resection of AV fibroelastoma on 08/04/2021   S/P CABG x 4    a. s/p CABG x4 LIMA to LAD, SVG to PDA, and Sequential SVG to OM and Diagonal on 8/   Stroke ([place]) 04/2021   admitted at [facility] - left sided weakness   Urinary incontinence    as stated by patient   Past Surgical History:  Procedure Laterality Date   AORTIC VALVE REPLACEMENT N/A 08/04/2021   Procedure: RESECTION AORTIC VALVE TUMOR;  Surgeon: [name], MD;  Location: [place];  Service: Open Heart Surgery;  Laterality: N/A;   APPLICATION OF WOUND VAC N/A 08/24/2021   Procedure: APPLICATION OF WOUND VAC;  Surgeon: Gaye Pollack, MD;   Location: [place] [facility];  Service: Thoracic;  Laterality: N/A;   APPLICATION OF WOUND VAC N/A 08/28/2021   Procedure: WOUND VAC CHANGE;  Surgeon: [name], MD;  Location: [place];  Service: Thoracic;  Laterality: N/A;   BREAST EXCISIONAL BIOPSY Right 1999   neg   BUBBLE STUDY  04/24/2021   Procedure: BUBBLE STUDY;  Surgeon: Sueanne Margarita, MD;  Location: [place];  Service: Cardiovascular;;   CATARACT EXTRACTION  12/2011   CATARACT EXTRACTION W/PHACO Left 12/02/2016   Procedure: CATARACT EXTRACTION PHACO AND INTRAOCULAR LENS PLACEMENT ([place]);  Surgeon: Estill Cotta, MD;  Location: [facility];  Service: Ophthalmology;  Laterality: Left;  [place] 2:14 AP% 28.4 CDE 59.73 Fluid pack lot # [id number]   COLONOSCOPY WITH PROPOFOL N/A 04/13/2022   Procedure: COLONOSCOPY WITH PROPOFOL;  Surgeon: Lin Landsman, MD;  Location: [facility] ENDOSCOPY;  Service: Gastroenterology;  Laterality: N/A;   CORONARY ARTERY BYPASS GRAFT N/A 08/04/2021   Procedure: CORONARY ARTERY BYPASS GRAFTING (CABG) X  4  USING LEFT INTERNAL MAMMARY ARTERY AND RIGHT GREATER SAPHENOUS VEIN ENDOSCOPIC CONDUITS;  Surgeon: [name], MD;  Location: [place];  Service: Open Heart Surgery;  Laterality: N/A;  DIAGNOSTIC LAPAROSCOPY     EYE SURGERY     IR CT HEAD LTD  04/19/2021   IR PERCUTANEOUS ART THROMBECTOMY/INFUSION INTRACRANIAL INC DIAG ANGIO  04/19/2021       IR PERCUTANEOUS ART THROMBECTOMY/INFUSION INTRACRANIAL INC DIAG ANGIO  04/19/2021   IR US GUIDE VASC ACCESS LEFT  04/19/2021   RADIOLOGY WITH ANESTHESIA N/A 04/19/2021   Procedure: IR WITH ANESTHESIA;  Surgeon: Luanne Bras, MD;  Location: [place];  Service: Radiology;  Laterality: N/A;   STERNAL WOUND DEBRIDEMENT N/A 08/24/2021   Procedure: STERNAL WOUND DRAINAGE AND DEBRIDEMENT;  Surgeon: Gaye Pollack, MD;  Location: [place] [facility];  Service: Thoracic;  Laterality: N/A;   STERNAL WOUND DEBRIDEMENT N/A 08/28/2021   Procedure: STERNAL WOUND DEBRIDEMENT;   Surgeon: [name], MD;  Location: [place];  Service: Thoracic;  Laterality: N/A;   TEE WITHOUT CARDIOVERSION N/A 04/24/2021   Procedure: TRANSESOPHAGEAL ECHOCARDIOGRAM (TEE);  Surgeon: Sueanne Margarita, MD;  Location: [facility] ENDOSCOPY;  Service: Cardiovascular;  Laterality: N/A;   TEE WITHOUT CARDIOVERSION N/A 08/04/2021   Procedure: TRANSESOPHAGEAL ECHOCARDIOGRAM (TEE);  Surgeon: [name], MD;  Location: [place];  Service: Open Heart Surgery;  Laterality: N/A;   TOTAL HIP ARTHROPLASTY Right 04/04/2018   Procedure: TOTAL HIP ARTHROPLASTY;  Surgeon: Dereck Leep, MD;  Location: [facility];  Service: Orthopedics;  Laterality: Right;   Patient Active Problem List   Diagnosis Date Noted   Colon cancer screening    Subcutaneous nodule    Acute blood loss anemia    Diabetic peripheral neuropathy ([place])    Debility 08/11/2021   S/P CABG x 4 08/04/2021   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   Hyperkalemia    Chronic bilateral low back pain without sciatica    Leukocytosis    Hyponatremia    Essential hypertension    Slow transit constipation    Hemiparesis affecting left side as late effect of stroke ([place])    Aortic valve mass    History of CVA with residual deficit 04/19/2021   Dyslipidemia, goal LDL below 70 04/18/2021   Hypertensive urgency 04/18/2021   Recurrent major depressive disorder, in partial remission ([place]) 09/20/2020   Status post total replacement of hip 04/04/2018   Primary osteoarthritis of right hip 02/20/2018   Allergic rhinitis 10/28/2015   Acute stress disorder 08/01/2015   Anxiety 08/01/2015   Clinical depression 08/01/2015   Diabetes ([place]) 08/01/2015   Diverticulosis of colon 08/01/2015   Generalized pruritus 08/01/2015   BP (high blood pressure) 08/01/2015   Cannot sleep 08/01/2015   Adiposity 08/01/2015   Detrusor muscle hypertonia 08/01/2015   Hypercholesterolemia without hypertriglyceridemia 08/01/2015   Female stress incontinence  08/01/2015   Avitaminosis D 08/01/2015    ONSET DATE: 04/18/2021  REFERRING DIAG: hemiparesis affecting L side as late effect of CVA; dsymetria  THERAPY DIAG:  Muscle weakness (generalized)  Other lack of coordination  Right thalamic infarction [facility])  Rationale for Evaluation and Treatment Rehabilitation  PERTINENT HISTORY: Pt s/p R CVA on 04/18/21. Pt was initially recovering very well from a stroke standpoint with mild LUE >LLE numbness and ambulating with a cane. 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 Therapy eval's recommended CIR for decreased functional mobility - d/c to CIR on 8/29. She required incision debridement of sternal wound and application of VAC eventually discontinued on 9/29. She was  discharged home on 9/30 (after a 32 day stay).  PRECAUTIONS: Fall  SUBJECTIVE: Pt continues to report her L LF is still quite painful, but that she continues to wear her splint at home with activity.  Pt plans to address this pain with MD at follow up appointment next week.    PAIN:  Are you having pain? Yes; L LF, 5/10 pain with gripping activities.  Heat, rest, and splinting help to decrease pain.    OBJECTIVE:   TODAY'S TREATMENT:  07/03/2022 [place]: 10 min To L hand for pain management/muscle relaxation in prep for therapeutic exercises.  Neuro re-ed: Facilitated in hand manipulation skills and reaching toward a target with pt working to pick up small magnets from table top with L hand and placing magnets on a target on whiteboard placed at table top level.  Pt intermittently used R hand to position smaller magnets within fingertips in prep for placement on board, but pt was more easily able to use L hand only when handling larger magnets.    Manual Therapy: Performed edema massage to L hand digits and passive  stretching at the MCP, PIP, and DIP joints of the L hand IF and MF, working to increase composite fist in the L hand.   PATIENT EDUCATION:  Education details: Continue to limit repetitive flexion of L 3rd digit and wear splint with activity to prevent triggering.  Person educated: Patient Education method: Explanation and Verbal cues Education comprehension: verbalized understanding, returned demonstration, and needs further education   HOME EXERCISE PROGRAM FMC/GMC activities for LUE    OT Short Term Goals -        OT SHORT TERM GOAL #1   Title Pt will be indep to perform LUE HEP for coordination and strengthening.    Baseline Eval: not yet initiated; 06/30/22: pt remains consistent with HEP and avoiding repetitive gripping that causes pain in L LF.   Time 6    Period Weeks    Status ongoing   Target Date 06/29/22      OT SHORT TERM GOAL #2   Title Pt to be indep with joint protection and pain management strategies to limit triggering of L hand 3rd digit PIP.    Baseline Eval: Trigger finger splint recommended, initiated joint protection educ; 06/30/22: pt is consistent to wear her trigger finger splint and avoids repetitive gripping which causes pain to L LF PIP joint, though pt is still experiencing moderate pain in this joint with occasional triggering.  Pt plans to follow up with MD next week.   Time 6    Period Weeks    Status ongoing   Target Date 06/29/22              OT Long Term Goals -       OT LONG TERM GOAL #1   Title Pt will increase FOTO by 1-2 points to indicate improvement in perceived functional indep with daily tasks.    Baseline Eval: FOTO 60; 06/30/22: 61   Time 12    Period Weeks    Status ongoing   Target Date 08/10/22      OT LONG TERM GOAL #2   Title Pt will demonstrate increased L FMC/GMC to enable indep with putting hair in ponytail.    Baseline Eval: unable; 06/30/22: pt can hold her hair in a ponytail with BUEs but can not yet manage the  rubber band.   Time 12    Period Weeks    Status ongoing  Target Date 08/10/22      OT LONG TERM GOAL #3   Title Pt will increase L FMC to enable indep with tying shoe laces.    Baseline Eval: unable to tie laces; wears slip on shoes; 06/30/22: pt still wearing slip on shoes   Time 12    Period Weeks    Status ongoing   Target Date 08/10/22      OT LONG TERM GOAL #4   Title Pt will increase L hand dexterity skills to enable pt to pick up coins from table with L hand.    Baseline Eval: unable; 06/30/22: pt can pick up coins from table with extra time.  Dexterity skills improving as noted by improved 9 hole peg test from 5 min 38 sec to 3 min 44 sec on the L.   Time 12    Period Weeks    Status ongoing   Target Date 08/10/22      OT LONG TERM GOAL #5   Title Pt will increase L grip strength by 5 or more lbs to improve ability to hold and carry ADL supplies in L hand.    Baseline Eval: L grip 17# (R 35#); 06/30/22: L grip 10# (pain in L LF significantly limiting grip as finger continues to trigger.   Time 12    Period Weeks    Status ongoing   Target Date 08/10/22             Plan - 06/02/22 1036     Clinical Impression Statement Pt making steady gains with LUE gross and [facility] skills and pt continues to report that she is using her LUE more with daily tasks to reach for, hold, and stabilize ADL supplies.  Pt is consistent with her joint protection strategies and wearing her trigger finger splint with activity, but L hand remains stiff and painful at L LF PIP joint and pt is limited with her ability to close her hand to make a fist d/t joint pain and swelling in all L hand digits, but primarily L LF.  Moderate-severe dysmetria in LUE continues to limit efficient use of LUE for self care tasks.  Pt will continue to benefit from skilled OT for increasing LUE FMC/GMC skills for more efficient use of LUE during daily tasks.    OT Occupational Profile and History Problem Focused Assessment -  Including review of records relating to presenting problem    Occupational performance deficits (Please refer to evaluation for details): ADL's;IADL's;Work    Body Structure / Function / Physical Skills ADL;GMC;UE functional use;FMC;Decreased knowledge of use of DME;Pain;Strength;Coordination;Dexterity;IADL;Sensation;Balance    Rehab Potential Good    Clinical Decision Making Limited treatment options, no task modification necessary    Comorbidities Affecting Occupational Performance: May have comorbidities impacting occupational performance    Modification or Assistance to Complete Evaluation  No modification of tasks or assist necessary to complete eval    OT Frequency 2x / week    OT Duration 12 weeks    OT Treatment/Interventions Self-care/ADL training;Therapeutic exercise;Therapeutic activities;Cryotherapy;Moist Heat;Neuromuscular education;DME and/or AE instruction;Manual Therapy;Splinting;Patient/family education    Plan OT focus on LUE coordination/strength training and education/splinting for L hand 3rd digit PIP triggering    OT Home Exercise Plan Not yet initiated    Consulted and Agree with Plan of Care Patient            Leta Speller, MS, OTR/L   Darleene Cleaver, OT 07/06/2022, 9:24 AM

## 2022-07-07 ENCOUNTER — Ambulatory Visit: Payer: PPO

## 2022-07-07 ENCOUNTER — Encounter: Payer: PPO | Attending: Physical Medicine and Rehabilitation | Admitting: Physical Medicine and Rehabilitation

## 2022-07-07 ENCOUNTER — Other Ambulatory Visit (HOSPITAL_COMMUNITY): Payer: Self-pay

## 2022-07-07 DIAGNOSIS — F419 Anxiety disorder, unspecified: Secondary | ICD-10-CM | POA: Insufficient documentation

## 2022-07-07 DIAGNOSIS — M6281 Muscle weakness (generalized): Secondary | ICD-10-CM | POA: Diagnosis not present

## 2022-07-07 DIAGNOSIS — R29898 Other symptoms and signs involving the musculoskeletal system: Secondary | ICD-10-CM | POA: Diagnosis not present

## 2022-07-07 DIAGNOSIS — M6530 Trigger finger, unspecified finger: Secondary | ICD-10-CM | POA: Insufficient documentation

## 2022-07-07 DIAGNOSIS — Z951 Presence of aortocoronary bypass graft: Secondary | ICD-10-CM | POA: Insufficient documentation

## 2022-07-07 DIAGNOSIS — G59 Mononeuropathy in diseases classified elsewhere: Secondary | ICD-10-CM | POA: Diagnosis not present

## 2022-07-07 DIAGNOSIS — R278 Other lack of coordination: Secondary | ICD-10-CM

## 2022-07-07 DIAGNOSIS — I6381 Other cerebral infarction due to occlusion or stenosis of small artery: Secondary | ICD-10-CM

## 2022-07-07 MED ORDER — ALPRAZOLAM 0.5 MG PO TABS
0.5000 mg | ORAL_TABLET | Freq: Two times a day (BID) | ORAL | 3 refills | Status: DC | PRN
  Filled 2022-07-07: qty 180, fill #0
  Filled 2022-08-18: qty 180, 90d supply, fill #0

## 2022-07-07 MED ORDER — SERTRALINE HCL 25 MG PO TABS
25.0000 mg | ORAL_TABLET | Freq: Every day | ORAL | 3 refills | Status: AC
  Filled 2022-07-07 (×2): qty 90, 90d supply, fill #0

## 2022-07-07 NOTE — Patient Instructions (Addendum)
Every 2nd Thursday [facility] group 4-5  Anxiety: -Discussed exercise and meditation as tools to decrease anxiety. -Recommended Down Dog Yoga app -Discussed spending time outdoors. -Discussed positive re-framing of anxiety.  -Discussed the following foods that have been show to reduce anxiety: 1) [place] nuts, mushrooms, soy beans due to their high selenium content. Upper limit of toxicity of selenium is 462mg/day so no more than 3-4 bBolivianuts per day.  2) Fatty fish such as salmon, mackerel, sardines, trout, and herring- high in omega-3 fatty acids 3) Eggs- increases serotonin and dopamine 4) Pumpkin seeds- high in omega-3 fatty acids 5) dark chocolate- high in flavanols that increase blood flow to brain 6) turmeric- take with black pepper to increase absorption 7) chamomile tea- antioxidant and anti-inflammatory properties 8) yogurt without sugar- supports gut-brain axis 9) green tea- contains L- theanine 10) blueberries- high in vitamin C and antioxidants 11) [place] high in tryptophan which gets converted to serotonin 12) bell peppers- rich in vitamin C and antioxidants 13) citrus fruits- rich in vitamin C and antioxidants 14) almonds- high in vitamin E and healthy fats 15) chia seeds- high in omega-3 fatty acids

## 2022-07-07 NOTE — Therapy (Signed)
OUTPATIENT OCCUPATIONAL THERAPY TREATMENT NOTE    Patient Name: [name] MRN: [id number] DOB:Feb 05, 1956, 66 y.o., female Today's Date: 07/07/2022  PCP: Dr. Birdie Sons REFERRING PROVIDER: Frann Rider, NP   OT End of Session - 07/07/22 1326     Visit Number 12    Number of Visits 24    Date for OT Re-Evaluation 08/10/22    Authorization Time Period Reporting period beginning 06/30/22    OT Start Time 1102    OT Stop Time 1147    OT Time Calculation (min) 46 min    Equipment Utilized During Treatment transport chair, SBQC    Activity Tolerance Patient tolerated treatment well    Behavior During Therapy WFL for tasks assessed/performed              Past Medical History:  Diagnosis Date   Anxiety    Arthritis    CAD (coronary artery disease)    a. 3 vessel CAD noted on coronary CTA 04/2021, s/p CABG x4   Cataract    left   Depression    Diabetes mellitus without complication ([place])    type 2   Environmental and seasonal allergies    GERD (gastroesophageal reflux disease)    Hyperlipidemia    Hypertension    Joint pain    as reported by patient   Papillary fibroelastoma of heart    of the aortic valve,  s/p AVR and tumor resection on 08/04/2021   Right thalamic infarction ([place]) 04/29/2021   S/P AVR (aortic valve replacement)    s/p AVR with resection of AV fibroelastoma on 08/04/2021   S/P CABG x 4    a. s/p CABG x4 LIMA to LAD, SVG to PDA, and Sequential SVG to OM and Diagonal on 8/   Stroke ([place]) 04/2021   admitted at [facility] - left sided weakness   Urinary incontinence    as stated by patient   Past Surgical History:  Procedure Laterality Date   AORTIC VALVE REPLACEMENT N/A 08/04/2021   Procedure: RESECTION AORTIC VALVE TUMOR;  Surgeon: [name], MD;  Location: [place];  Service: Open Heart Surgery;  Laterality: N/A;   APPLICATION OF WOUND VAC N/A 08/24/2021   Procedure: APPLICATION OF WOUND VAC;  Surgeon: Gaye Pollack, MD;   Location: [place] [facility];  Service: Thoracic;  Laterality: N/A;   APPLICATION OF WOUND VAC N/A 08/28/2021   Procedure: WOUND VAC CHANGE;  Surgeon: [name], MD;  Location: [place];  Service: Thoracic;  Laterality: N/A;   BREAST EXCISIONAL BIOPSY Right 1999   neg   BUBBLE STUDY  04/24/2021   Procedure: BUBBLE STUDY;  Surgeon: Sueanne Margarita, MD;  Location: [place];  Service: Cardiovascular;;   CATARACT EXTRACTION  12/2011   CATARACT EXTRACTION W/PHACO Left 12/02/2016   Procedure: CATARACT EXTRACTION PHACO AND INTRAOCULAR LENS PLACEMENT ([place]);  Surgeon: Estill Cotta, MD;  Location: [facility];  Service: Ophthalmology;  Laterality: Left;  [place] 2:14 AP% 28.4 CDE 59.73 Fluid pack lot # [id number]   COLONOSCOPY WITH PROPOFOL N/A 04/13/2022   Procedure: COLONOSCOPY WITH PROPOFOL;  Surgeon: Lin Landsman, MD;  Location: [facility] ENDOSCOPY;  Service: Gastroenterology;  Laterality: N/A;   CORONARY ARTERY BYPASS GRAFT N/A 08/04/2021   Procedure: CORONARY ARTERY BYPASS GRAFTING (CABG) X  4  USING LEFT INTERNAL MAMMARY ARTERY AND RIGHT GREATER SAPHENOUS VEIN ENDOSCOPIC CONDUITS;  Surgeon: [name], MD;  Location: [place];  Service: Open Heart Surgery;  Laterality: N/A;  DIAGNOSTIC LAPAROSCOPY     EYE SURGERY     IR CT HEAD LTD  04/19/2021   IR PERCUTANEOUS ART THROMBECTOMY/INFUSION INTRACRANIAL INC DIAG ANGIO  04/19/2021       IR PERCUTANEOUS ART THROMBECTOMY/INFUSION INTRACRANIAL INC DIAG ANGIO  04/19/2021   IR US GUIDE VASC ACCESS LEFT  04/19/2021   RADIOLOGY WITH ANESTHESIA N/A 04/19/2021   Procedure: IR WITH ANESTHESIA;  Surgeon: Luanne Bras, MD;  Location: [place];  Service: Radiology;  Laterality: N/A;   STERNAL WOUND DEBRIDEMENT N/A 08/24/2021   Procedure: STERNAL WOUND DRAINAGE AND DEBRIDEMENT;  Surgeon: Gaye Pollack, MD;  Location: [place] [facility];  Service: Thoracic;  Laterality: N/A;   STERNAL WOUND DEBRIDEMENT N/A 08/28/2021   Procedure: STERNAL WOUND DEBRIDEMENT;   Surgeon: [name], MD;  Location: [place];  Service: Thoracic;  Laterality: N/A;   TEE WITHOUT CARDIOVERSION N/A 04/24/2021   Procedure: TRANSESOPHAGEAL ECHOCARDIOGRAM (TEE);  Surgeon: Sueanne Margarita, MD;  Location: [facility] ENDOSCOPY;  Service: Cardiovascular;  Laterality: N/A;   TEE WITHOUT CARDIOVERSION N/A 08/04/2021   Procedure: TRANSESOPHAGEAL ECHOCARDIOGRAM (TEE);  Surgeon: [name], MD;  Location: [place];  Service: Open Heart Surgery;  Laterality: N/A;   TOTAL HIP ARTHROPLASTY Right 04/04/2018   Procedure: TOTAL HIP ARTHROPLASTY;  Surgeon: Dereck Leep, MD;  Location: [facility];  Service: Orthopedics;  Laterality: Right;   Patient Active Problem List   Diagnosis Date Noted   Colon cancer screening    Subcutaneous nodule    Acute blood loss anemia    Diabetic peripheral neuropathy ([place])    Debility 08/11/2021   S/P CABG x 4 08/04/2021   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   Hyperkalemia    Chronic bilateral low back pain without sciatica    Leukocytosis    Hyponatremia    Essential hypertension    Slow transit constipation    Hemiparesis affecting left side as late effect of stroke ([place])    Aortic valve mass    History of CVA with residual deficit 04/19/2021   Dyslipidemia, goal LDL below 70 04/18/2021   Hypertensive urgency 04/18/2021   Recurrent major depressive disorder, in partial remission ([place]) 09/20/2020   Status post total replacement of hip 04/04/2018   Primary osteoarthritis of right hip 02/20/2018   Allergic rhinitis 10/28/2015   Acute stress disorder 08/01/2015   Anxiety 08/01/2015   Clinical depression 08/01/2015   Diabetes ([place]) 08/01/2015   Diverticulosis of colon 08/01/2015   Generalized pruritus 08/01/2015   BP (high blood pressure) 08/01/2015   Cannot sleep 08/01/2015   Adiposity 08/01/2015   Detrusor muscle hypertonia 08/01/2015   Hypercholesterolemia without hypertriglyceridemia 08/01/2015   Female stress incontinence  08/01/2015   Avitaminosis D 08/01/2015    ONSET DATE: 04/18/2021  REFERRING DIAG: hemiparesis affecting L side as late effect of CVA; dsymetria  THERAPY DIAG:  Muscle weakness (generalized)  Other lack of coordination  Right thalamic infarction [facility])  Rationale for Evaluation and Treatment Rehabilitation  PERTINENT HISTORY: Pt s/p R CVA on 04/18/21. Pt was initially recovering very well from a stroke standpoint with mild LUE >LLE numbness and ambulating with a cane. 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 Therapy eval's recommended CIR for decreased functional mobility - d/c to CIR on 8/29. She required incision debridement of sternal wound and application of VAC eventually discontinued on 9/29. She was  discharged home on 9/30 (after a 32 day stay).  PRECAUTIONS: Fall  SUBJECTIVE: Pt states that she will see Dr. Ranell Patrick after her OT visit this date.   PAIN:  Are you having pain? Yes; L LF, 5/10 pain with gripping activities.  Heat, rest, and splinting help to decrease pain.   OBJECTIVE:   TODAY'S TREATMENT:  07/07/2022 Paraffin: 10 min To L hand for pain management/muscle relaxation in prep for therapeutic exercises.  Neuro re-ed: Facilitated in hand manipulation skills and reaching toward a target with pt working to pick up thin, 2" sticks and small glass stones from non-skid surface (towel) and directed them through a hole/target placed above table top level.  Practiced picking up washers from same surface and threading them over vertical dowel for the same goal noted above.  Pt was cued for 2 and 3 point pinch patterns in L hand and vc to reduce compensatory strategies when reaching toward target.   Manual Therapy: Performed edema massage to L hand digits and passive stretching at the MCP, PIP, and DIP joints of the L  hand IF and MF, working to increase composite fist in the L hand.   PATIENT EDUCATION:  Education details: Continue to limit repetitive flexion of L 3rd digit and wear splint with activity to prevent triggering.  Person educated: Patient Education method: Explanation and Verbal cues Education comprehension: verbalized understanding, returned demonstration, and needs further education   HOME EXERCISE PROGRAM FMC/GMC activities for LUE    OT Short Term Goals -        OT SHORT TERM GOAL #1   Title Pt will be indep to perform LUE HEP for coordination and strengthening.    Baseline Eval: not yet initiated; 06/30/22: pt remains consistent with HEP and avoiding repetitive gripping that causes pain in L LF.   Time 6    Period Weeks    Status ongoing   Target Date 06/29/22      OT SHORT TERM GOAL #2   Title Pt to be indep with joint protection and pain management strategies to limit triggering of L hand 3rd digit PIP.    Baseline Eval: Trigger finger splint recommended, initiated joint protection educ; 06/30/22: pt is consistent to wear her trigger finger splint and avoids repetitive gripping which causes pain to L LF PIP joint, though pt is still experiencing moderate pain in this joint with occasional triggering.  Pt plans to follow up with MD next week.   Time 6    Period Weeks    Status ongoing   Target Date 06/29/22              OT Long Term Goals -       OT LONG TERM GOAL #1   Title Pt will increase FOTO by 1-2 points to indicate improvement in perceived functional indep with daily tasks.    Baseline Eval: FOTO 60; 06/30/22: 61   Time 12    Period Weeks    Status ongoing   Target Date 08/10/22      OT LONG TERM GOAL #2   Title Pt will demonstrate increased L FMC/GMC to enable indep with putting hair in ponytail.    Baseline Eval: unable; 06/30/22: pt can hold her hair in a ponytail with BUEs but can not yet manage the rubber band.   Time 12    Period Weeks    Status  ongoing   Target Date 08/10/22      OT LONG TERM GOAL #3  Title Pt will increase L FMC to enable indep with tying shoe laces.    Baseline Eval: unable to tie laces; wears slip on shoes; 06/30/22: pt still wearing slip on shoes   Time 12    Period Weeks    Status ongoing   Target Date 08/10/22      OT LONG TERM GOAL #4   Title Pt will increase L hand dexterity skills to enable pt to pick up coins from table with L hand.    Baseline Eval: unable; 06/30/22: pt can pick up coins from table with extra time.  Dexterity skills improving as noted by improved 9 hole peg test from 5 min 38 sec to 3 min 44 sec on the L.   Time 12    Period Weeks    Status ongoing   Target Date 08/10/22      OT LONG TERM GOAL #5   Title Pt will increase L grip strength by 5 or more lbs to improve ability to hold and carry ADL supplies in L hand.    Baseline Eval: L grip 17# (R 35#); 06/30/22: L grip 10# (pain in L LF significantly limiting grip as finger continues to trigger.   Time 12    Period Weeks    Status ongoing   Target Date 08/10/22             Plan - 06/02/22 1036     Clinical Impression Statement Pt frustrated with the pain and swelling in her L hand digits, primarily L IF and LF.  Pt will see Dr. Ranell Patrick today for follow up and will inquire about options to manage her finger pain.  Paraffin, edema massage, and passive stretching all help to manage pain in L hand digits but relief is temporary.  Pt remains consistent with her joint protection strategies and wearing her trigger finger splint with activity.  Moderate-severe dysmetria in LUE continues to limit efficient use of LUE for self care tasks.  Pt will continue to benefit from skilled OT for increasing LUE FMC/GMC skills for more efficient use of LUE during daily tasks.    OT Occupational Profile and History Problem Focused Assessment - Including review of records relating to presenting problem    Occupational performance deficits (Please refer  to evaluation for details): ADL's;IADL's;Work    Body Structure / Function / Physical Skills ADL;GMC;UE functional use;FMC;Decreased knowledge of use of DME;Pain;Strength;Coordination;Dexterity;IADL;Sensation;Balance    Rehab Potential Good    Clinical Decision Making Limited treatment options, no task modification necessary    Comorbidities Affecting Occupational Performance: May have comorbidities impacting occupational performance    Modification or Assistance to Complete Evaluation  No modification of tasks or assist necessary to complete eval    OT Frequency 2x / week    OT Duration 12 weeks    OT Treatment/Interventions Self-care/ADL training;Therapeutic exercise;Therapeutic activities;Cryotherapy;Moist Heat;Neuromuscular education;DME and/or AE instruction;Manual Therapy;Splinting;Patient/family education    Plan OT focus on LUE coordination/strength training and education/splinting for L hand 3rd digit PIP triggering    OT Home Exercise Plan Not yet initiated    Consulted and Agree with Plan of Care Patient            Leta Speller, MS, OTR/L   Darleene Cleaver, OT 07/07/2022, 1:28 PM

## 2022-07-09 ENCOUNTER — Other Ambulatory Visit (HOSPITAL_COMMUNITY): Payer: Self-pay

## 2022-07-09 ENCOUNTER — Ambulatory Visit: Payer: PPO

## 2022-07-09 DIAGNOSIS — R278 Other lack of coordination: Secondary | ICD-10-CM

## 2022-07-09 DIAGNOSIS — M6281 Muscle weakness (generalized): Secondary | ICD-10-CM | POA: Diagnosis not present

## 2022-07-09 DIAGNOSIS — I6381 Other cerebral infarction due to occlusion or stenosis of small artery: Secondary | ICD-10-CM

## 2022-07-09 NOTE — Therapy (Signed)
OUTPATIENT OCCUPATIONAL THERAPY TREATMENT NOTE    Patient Name: [name] MRN: [id number] DOB:01-29-1956, 66 y.o., female Today's Date: 07/09/2022  PCP: Dr. Birdie Sons REFERRING PROVIDER: Frann Rider, NP   OT End of Session - 07/09/22 1536     Visit Number 13    Number of Visits 24    Date for OT Re-Evaluation 08/10/22    Authorization Time Period Reporting period beginning 06/30/22    OT Start Time 31    OT Stop Time 1145    OT Time Calculation (min) 71 min    Equipment Utilized During Treatment transport chair, SBQC    Activity Tolerance Patient tolerated treatment well    Behavior During Therapy WFL for tasks assessed/performed              Past Medical History:  Diagnosis Date   Anxiety    Arthritis    CAD (coronary artery disease)    a. 3 vessel CAD noted on coronary CTA 04/2021, s/p CABG x4   Cataract    left   Depression    Diabetes mellitus without complication ([place])    type 2   Environmental and seasonal allergies    GERD (gastroesophageal reflux disease)    Hyperlipidemia    Hypertension    Joint pain    as reported by patient   Papillary fibroelastoma of heart    of the aortic valve,  s/p AVR and tumor resection on 08/04/2021   Right thalamic infarction ([place]) 04/29/2021   S/P AVR (aortic valve replacement)    s/p AVR with resection of AV fibroelastoma on 08/04/2021   S/P CABG x 4    a. s/p CABG x4 LIMA to LAD, SVG to PDA, and Sequential SVG to OM and Diagonal on 8/   Stroke ([place]) 04/2021   admitted at [facility] - left sided weakness   Urinary incontinence    as stated by patient   Past Surgical History:  Procedure Laterality Date   AORTIC VALVE REPLACEMENT N/A 08/04/2021   Procedure: RESECTION AORTIC VALVE TUMOR;  Surgeon: [name], MD;  Location: [place];  Service: Open Heart Surgery;  Laterality: N/A;   APPLICATION OF WOUND VAC N/A 08/24/2021   Procedure: APPLICATION OF WOUND VAC;  Surgeon: Gaye Pollack, MD;   Location: [place] OR;  Service: Thoracic;  Laterality: N/A;   APPLICATION OF WOUND VAC N/A 08/28/2021   Procedure: WOUND VAC CHANGE;  Surgeon: [name], MD;  Location: [place];  Service: Thoracic;  Laterality: N/A;   BREAST EXCISIONAL BIOPSY Right 1999   neg   BUBBLE STUDY  04/24/2021   Procedure: BUBBLE STUDY;  Surgeon: Sueanne Margarita, MD;  Location: [place];  Service: Cardiovascular;;   CATARACT EXTRACTION  12/2011   CATARACT EXTRACTION W/PHACO Left 12/02/2016   Procedure: CATARACT EXTRACTION PHACO AND INTRAOCULAR LENS PLACEMENT ([place]);  Surgeon: Estill Cotta, MD;  Location: [facility];  Service: Ophthalmology;  Laterality: Left;  [place] 2:14 AP% 28.4 CDE 59.73 Fluid pack lot # [id number]   COLONOSCOPY WITH PROPOFOL N/A 04/13/2022   Procedure: COLONOSCOPY WITH PROPOFOL;  Surgeon: Lin Landsman, MD;  Location: [facility] ENDOSCOPY;  Service: Gastroenterology;  Laterality: N/A;   CORONARY ARTERY BYPASS GRAFT N/A 08/04/2021   Procedure: CORONARY ARTERY BYPASS GRAFTING (CABG) X  4  USING LEFT INTERNAL MAMMARY ARTERY AND RIGHT GREATER SAPHENOUS VEIN ENDOSCOPIC CONDUITS;  Surgeon: [name], MD;  Location: [place];  Service: Open Heart Surgery;  Laterality: N/A;  DIAGNOSTIC LAPAROSCOPY     EYE SURGERY     IR CT HEAD LTD  04/19/2021   IR PERCUTANEOUS ART THROMBECTOMY/INFUSION INTRACRANIAL INC DIAG ANGIO  04/19/2021       IR PERCUTANEOUS ART THROMBECTOMY/INFUSION INTRACRANIAL INC DIAG ANGIO  04/19/2021   IR US GUIDE VASC ACCESS LEFT  04/19/2021   RADIOLOGY WITH ANESTHESIA N/A 04/19/2021   Procedure: IR WITH ANESTHESIA;  Surgeon: Luanne Bras, MD;  Location: [place];  Service: Radiology;  Laterality: N/A;   STERNAL WOUND DEBRIDEMENT N/A 08/24/2021   Procedure: STERNAL WOUND DRAINAGE AND DEBRIDEMENT;  Surgeon: Gaye Pollack, MD;  Location: [place] OR;  Service: Thoracic;  Laterality: N/A;   STERNAL WOUND DEBRIDEMENT N/A 08/28/2021   Procedure: STERNAL WOUND DEBRIDEMENT;   Surgeon: [name], MD;  Location: [place];  Service: Thoracic;  Laterality: N/A;   TEE WITHOUT CARDIOVERSION N/A 04/24/2021   Procedure: TRANSESOPHAGEAL ECHOCARDIOGRAM (TEE);  Surgeon: Sueanne Margarita, MD;  Location: [facility] ENDOSCOPY;  Service: Cardiovascular;  Laterality: N/A;   TEE WITHOUT CARDIOVERSION N/A 08/04/2021   Procedure: TRANSESOPHAGEAL ECHOCARDIOGRAM (TEE);  Surgeon: [name], MD;  Location: [place];  Service: Open Heart Surgery;  Laterality: N/A;   TOTAL HIP ARTHROPLASTY Right 04/04/2018   Procedure: TOTAL HIP ARTHROPLASTY;  Surgeon: Dereck Leep, MD;  Location: [facility];  Service: Orthopedics;  Laterality: Right;   Patient Active Problem List   Diagnosis Date Noted   Colon cancer screening    Subcutaneous nodule    Acute blood loss anemia    Diabetic peripheral neuropathy ([place])    Debility 08/11/2021   S/P CABG x 4 08/04/2021   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   Hyperkalemia    Chronic bilateral low back pain without sciatica    Leukocytosis    Hyponatremia    Essential hypertension    Slow transit constipation    Hemiparesis affecting left side as late effect of stroke ([place])    Aortic valve mass    History of CVA with residual deficit 04/19/2021   Dyslipidemia, goal LDL below 70 04/18/2021   Hypertensive urgency 04/18/2021   Recurrent major depressive disorder, in partial remission ([place]) 09/20/2020   Status post total replacement of hip 04/04/2018   Primary osteoarthritis of right hip 02/20/2018   Allergic rhinitis 10/28/2015   Acute stress disorder 08/01/2015   Anxiety 08/01/2015   Clinical depression 08/01/2015   Diabetes ([place]) 08/01/2015   Diverticulosis of colon 08/01/2015   Generalized pruritus 08/01/2015   BP (high blood pressure) 08/01/2015   Cannot sleep 08/01/2015   Adiposity 08/01/2015   Detrusor muscle hypertonia 08/01/2015   Hypercholesterolemia without hypertriglyceridemia 08/01/2015   Female stress incontinence  08/01/2015   Avitaminosis D 08/01/2015    ONSET DATE: 04/18/2021  REFERRING DIAG: hemiparesis affecting L side as late effect of CVA; dsymetria  THERAPY DIAG:  Muscle weakness (generalized)  Other lack of coordination  Right thalamic infarction [facility])  Rationale for Evaluation and Treatment Rehabilitation  PERTINENT HISTORY: Pt s/p R CVA on 04/18/21. Pt was initially recovering very well from a stroke standpoint with mild LUE >LLE numbness and ambulating with a cane. 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 Therapy eval's recommended CIR for decreased functional mobility - d/c to CIR on 8/29. She required incision debridement of sternal wound and application of VAC eventually discontinued on 9/29. She was  discharged home on 9/30 (after a 32 day stay).  PRECAUTIONS: Fall  SUBJECTIVE: Pt states that she saw Dr. Ranell Patrick and she plans to do a cortisone injection in her L LF for the pain related to her trigger finger.  Dr. Ranell Patrick also recommended pt for a Vivistim eval, which pt plans to pursue.  PAIN:  Are you having pain? Yes; L LF, 3/10 pain with gripping activities.  Heat, rest, and splinting help to decrease pain.   OBJECTIVE:   TODAY'S TREATMENT:  07/07/2022 [place]: 10 min To L hand for pain management/muscle relaxation in prep for therapeutic exercises.  Neuro re-ed: Facilitated in hand manipulation skills and reaching toward a target with pt working to pick up short jumbo pegs from table top (pegs placed on towel for non-skid surface) and placed into pegboard placed on an incline.  Pt had multiple dropped pegs but improved with cues for slowing pace with sequencing.  Pt placed 1 row of ten at 10 of board, then completed 5 rows of rotating pegs in hand 180 degrees to place in the next row.  Pt also had multiple dropped  pegs when rotating but improved with same cues noted above.  OT instructed pt to avoid stabilizing any part of her hand on the pegboard to challenge her control with reaching toward her targets.    Manual Therapy: Performed edema massage to L hand digits and passive stretching at the MCP, PIP, and DIP joints of the L hand IF and MF, working to increase composite fist in the L hand.   PATIENT EDUCATION:  Education details: Continue to limit repetitive flexion of L 3rd digit and wear splint with activity to prevent triggering.  Person educated: Patient Education method: Explanation and Verbal cues Education comprehension: verbalized understanding, returned demonstration, and needs further education   HOME EXERCISE PROGRAM FMC/GMC activities for LUE    OT Short Term Goals -        OT SHORT TERM GOAL #1   Title Pt will be indep to perform LUE HEP for coordination and strengthening.    Baseline Eval: not yet initiated; 06/30/22: pt remains consistent with HEP and avoiding repetitive gripping that causes pain in L LF.   Time 6    Period Weeks    Status ongoing   Target Date 06/29/22      OT SHORT TERM GOAL #2   Title Pt to be indep with joint protection and pain management strategies to limit triggering of L hand 3rd digit PIP.    Baseline Eval: Trigger finger splint recommended, initiated joint protection educ; 06/30/22: pt is consistent to wear her trigger finger splint and avoids repetitive gripping which causes pain to L LF PIP joint, though pt is still experiencing moderate pain in this joint with occasional triggering.  Pt plans to follow up with MD next week.   Time 6    Period Weeks    Status ongoing   Target Date 06/29/22              OT Long Term Goals -       OT LONG TERM GOAL #1   Title Pt will increase FOTO by 1-2 points to indicate improvement in perceived functional indep with daily tasks.    Baseline Eval: FOTO 60; 06/30/22: 61   Time 12    Period Weeks     Status ongoing   Target Date 08/10/22      OT LONG TERM GOAL #2   Title Pt will  demonstrate increased L FMC/GMC to enable indep with putting hair in ponytail.    Baseline Eval: unable; 06/30/22: pt can hold her hair in a ponytail with BUEs but can not yet manage the rubber band.   Time 12    Period Weeks    Status ongoing   Target Date 08/10/22      OT LONG TERM GOAL #3   Title Pt will increase L FMC to enable indep with tying shoe laces.    Baseline Eval: unable to tie laces; wears slip on shoes; 06/30/22: pt still wearing slip on shoes   Time 12    Period Weeks    Status ongoing   Target Date 08/10/22      OT LONG TERM GOAL #4   Title Pt will increase L hand dexterity skills to enable pt to pick up coins from table with L hand.    Baseline Eval: unable; 06/30/22: pt can pick up coins from table with extra time.  Dexterity skills improving as noted by improved 9 hole peg test from 5 min 38 sec to 3 min 44 sec on the L.   Time 12    Period Weeks    Status ongoing   Target Date 08/10/22      OT LONG TERM GOAL #5   Title Pt will increase L grip strength by 5 or more lbs to improve ability to hold and carry ADL supplies in L hand.    Baseline Eval: L grip 17# (R 35#); 06/30/22: L grip 10# (pain in L LF significantly limiting grip as finger continues to trigger.   Time 12    Period Weeks    Status ongoing   Target Date 08/10/22             Plan - 06/02/22 1036     Clinical Impression Statement Pt reports some increase in use of her L hand since OT started manual therapy (see note for details), allowing her to close her hand more.  Pt has not yet obtained a compression glove, but was strongly advised to obtain one when able to further reduce swelling in L hand digits which should contribute to more functional use.  Pt has cortisone injection scheduled for Aug 29 for L hand LF pain.  OT issued Vivistim handout as Dr. Ranell Patrick has referred pt for Vivistim eval.  Pt very receptive and  plans to pursue this eval.  Explained that if pt qualifies for this procedure that therapy at [facility] would discontinue so that pt can transition to [facility] to work with therapist trained in Vivistim.  Pt verbalized understanding and feels eager to pursue this.  Pt tolerated all therapeutic exercises well this date.  Moderate-severe dysmetria in LUE continues to limit efficient use of LUE for self care tasks.  Pt will continue to benefit from skilled OT for increasing LUE FMC/GMC skills for more efficient use of LUE during daily tasks.    OT Occupational Profile and History Problem Focused Assessment - Including review of records relating to presenting problem    Occupational performance deficits (Please refer to evaluation for details): ADL's;IADL's;Work    Body Structure / Function / Physical Skills ADL;GMC;UE functional use;FMC;Decreased knowledge of use of DME;Pain;Strength;Coordination;Dexterity;IADL;Sensation;Balance    Rehab Potential Good    Clinical Decision Making Limited treatment options, no task modification necessary    Comorbidities Affecting Occupational Performance: May have comorbidities impacting occupational performance    Modification or Assistance to Complete Evaluation  No modification of tasks or  assist necessary to complete eval    OT Frequency 2x / week    OT Duration 12 weeks    OT Treatment/Interventions Self-care/ADL training;Therapeutic exercise;Therapeutic activities;Cryotherapy;Moist Heat;Neuromuscular education;DME and/or AE instruction;Manual Therapy;Splinting;Patient/family education    Plan OT focus on LUE coordination/strength training and education/splinting for L hand 3rd digit PIP triggering    OT Home Exercise Plan Not yet initiated    Consulted and Agree with Plan of Care Patient            Leta Speller, MS, OTR/L   Darleene Cleaver, OT 07/09/2022, 3:37 PM

## 2022-07-14 ENCOUNTER — Ambulatory Visit: Payer: PPO | Attending: Adult Health

## 2022-07-14 DIAGNOSIS — R278 Other lack of coordination: Secondary | ICD-10-CM | POA: Insufficient documentation

## 2022-07-14 DIAGNOSIS — M6281 Muscle weakness (generalized): Secondary | ICD-10-CM | POA: Diagnosis not present

## 2022-07-14 DIAGNOSIS — I6930 Unspecified sequelae of cerebral infarction: Secondary | ICD-10-CM | POA: Insufficient documentation

## 2022-07-14 NOTE — Therapy (Signed)
OUTPATIENT OCCUPATIONAL THERAPY TREATMENT NOTE    Patient Name: [name] MRN: [id number] DOB:[birth year], 66 y.o., female Today's Date: 07/14/2022  PCP: Dr. Birdie Sons REFERRING PROVIDER: Frann Rider, NP   OT End of Session - 07/14/22 1620     Visit Number 14    Number of Visits 24    Date for OT Re-Evaluation 08/10/22    Authorization Time Period Reporting period beginning 06/30/22    OT Start Time 38    OT Stop Time 1145    OT Time Calculation (min) 42 min    Equipment Utilized During Treatment transport chair, SBQC    Activity Tolerance Patient tolerated treatment well    Behavior During Therapy WFL for tasks assessed/performed              Past Medical History:  Diagnosis Date   Anxiety    Arthritis    CAD (coronary artery disease)    a. 3 vessel CAD noted on coronary CTA 04/2021, s/p CABG x4   Cataract    left   Depression    Diabetes mellitus without complication ([place])    type 2   Environmental and seasonal allergies    GERD (gastroesophageal reflux disease)    Hyperlipidemia    Hypertension    Joint pain    as reported by patient   Papillary fibroelastoma of heart    of the aortic valve,  s/p AVR and tumor resection on 08/04/2021   Right thalamic infarction ([place]) 04/29/2021   S/P AVR (aortic valve replacement)    s/p AVR with resection of AV fibroelastoma on 08/04/2021   S/P CABG x 4    a. s/p CABG x4 LIMA to LAD, SVG to PDA, and Sequential SVG to OM and Diagonal on 8/   Stroke ([place]) 04/2021   admitted at [facility] - left sided weakness   Urinary incontinence    as stated by patient   Past Surgical History:  Procedure Laterality Date   AORTIC VALVE REPLACEMENT N/A 08/04/2021   Procedure: RESECTION AORTIC VALVE TUMOR;  Surgeon: [name], MD;  Location: [place];  Service: Open Heart Surgery;  Laterality: N/A;   APPLICATION OF WOUND VAC N/A 08/24/2021   Procedure: APPLICATION OF WOUND VAC;  Surgeon: Gaye Pollack, MD;   Location: [place] [facility];  Service: Thoracic;  Laterality: N/A;   APPLICATION OF WOUND VAC N/A 08/28/2021   Procedure: WOUND VAC CHANGE;  Surgeon: [name], MD;  Location: [place];  Service: Thoracic;  Laterality: N/A;   BREAST EXCISIONAL BIOPSY Right 1999   neg   BUBBLE STUDY  04/24/2021   Procedure: BUBBLE STUDY;  Surgeon: Sueanne Margarita, MD;  Location: [place];  Service: Cardiovascular;;   CATARACT EXTRACTION  12/2011   CATARACT EXTRACTION W/PHACO Left 12/02/2016   Procedure: CATARACT EXTRACTION PHACO AND INTRAOCULAR LENS PLACEMENT ([place]);  Surgeon: Estill Cotta, MD;  Location: [facility];  Service: Ophthalmology;  Laterality: Left;  [place] 2:14 AP% 28.4 CDE 59.73 Fluid pack lot # [id number]   COLONOSCOPY WITH PROPOFOL N/A 04/13/2022   Procedure: COLONOSCOPY WITH PROPOFOL;  Surgeon: Lin Landsman, MD;  Location: [facility];  Service: Gastroenterology;  Laterality: N/A;   CORONARY ARTERY BYPASS GRAFT N/A 08/04/2021   Procedure: CORONARY ARTERY BYPASS GRAFTING (CABG) X  4  USING LEFT INTERNAL MAMMARY ARTERY AND RIGHT GREATER SAPHENOUS VEIN ENDOSCOPIC CONDUITS;  Surgeon: [name], MD;  Location: [place];  Service: Open Heart Surgery;  Laterality: N/A;  DIAGNOSTIC LAPAROSCOPY     EYE SURGERY     IR CT HEAD LTD  04/19/2021   IR PERCUTANEOUS ART THROMBECTOMY/INFUSION INTRACRANIAL INC DIAG ANGIO  04/19/2021       IR PERCUTANEOUS ART THROMBECTOMY/INFUSION INTRACRANIAL INC DIAG ANGIO  04/19/2021   IR US GUIDE VASC ACCESS LEFT  04/19/2021   RADIOLOGY WITH ANESTHESIA N/A 04/19/2021   Procedure: IR WITH ANESTHESIA;  Surgeon: Luanne Bras, MD;  Location: [place];  Service: Radiology;  Laterality: N/A;   STERNAL WOUND DEBRIDEMENT N/A 08/24/2021   Procedure: STERNAL WOUND DRAINAGE AND DEBRIDEMENT;  Surgeon: Gaye Pollack, MD;  Location: [place] [facility];  Service: Thoracic;  Laterality: N/A;   STERNAL WOUND DEBRIDEMENT N/A 08/28/2021   Procedure: STERNAL WOUND DEBRIDEMENT;   Surgeon: [name], MD;  Location: [place];  Service: Thoracic;  Laterality: N/A;   TEE WITHOUT CARDIOVERSION N/A 04/24/2021   Procedure: TRANSESOPHAGEAL ECHOCARDIOGRAM (TEE);  Surgeon: Sueanne Margarita, MD;  Location: [facility] END[facility]COPY;  Service: Cardiovascular;  Laterality: N/A;   TEE WITHOUT CARDIOVERSION N/A 08/04/2021   Procedure: TRANSESOPHAGEAL ECHOCARDIOGRAM (TEE);  Surgeon: [name], MD;  Location: [place];  Service: Open Heart Surgery;  Laterality: N/A;   TOTAL HIP ARTHROPLASTY Right 04/04/2018   Procedure: TOTAL HIP ARTHROPLASTY;  Surgeon: Dereck Leep, MD;  Location: [facility];  Service: Orthopedics;  Laterality: Right;   Patient Active Problem List   Diagnosis Date Noted   Colon cancer screening    Subcutaneous nodule    Acute blood loss anemia    Diabetic peripheral neuropathy ([place])    Debility 08/11/2021   S/P CABG x 4 08/04/2021   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   Hyperkalemia    Chronic bilateral low back pain without sciatica    Leukocytosis    Hyponatremia    Essential hypertension    Slow transit constipation    Hemiparesis affecting left side as late effect of stroke ([place])    Aortic valve mass    History of CVA with residual deficit 04/19/2021   Dyslipidemia, goal LDL below 70 04/18/2021   Hypertensive urgency 04/18/2021   Recurrent major depressive disorder, in partial remission ([place]) 09/20/2020   Status post total replacement of hip 04/04/2018   Primary osteoarthritis of right hip 02/20/2018   Allergic rhinitis 10/28/2015   Acute stress disorder 08/01/2015   Anxiety 08/01/2015   Clinical depression 08/01/2015   Diabetes ([place]) 08/01/2015   Diverticulosis of colon 08/01/2015   Generalized pruritus 08/01/2015   BP (high blood pressure) 08/01/2015   Cannot sleep 08/01/2015   Adiposity 08/01/2015   Detrusor muscle hypertonia 08/01/2015   Hypercholesterolemia without hypertriglyceridemia 08/01/2015   Female stress incontinence  08/01/2015   Avitaminosis D 08/01/2015    ONSET DATE: 04/18/2021  REFERRING DIAG: hemiparesis affecting L side as late effect of CVA; dsymetria  THERAPY DIAG:  Muscle weakness (generalized)  Other lack of coordination  History of CVA with residual deficit  Rationale for Evaluation and Treatment Rehabilitation  PERTINENT HISTORY: Pt s/p R CVA on 04/18/21. Pt was initially recovering very well from a stroke standpoint with mild LUE >LLE numbness and ambulating with a cane. 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 Therapy eval's recommended CIR for decreased functional mobility - d/c to CIR on 8/29. She required incision debridement of sternal wound and application of VAC eventually discontinued on 9/29.  She was discharged home on 9/30 (after a 32 day stay).  PRECAUTIONS: Fall  SUBJECTIVE: Pt states that her compression glove hasn't arrived yet but it is supposed to arrive between today and Friday.  PAIN:  Are you having pain? Yes; L LF, 8/10 pain with gripping activities, 0/10 at rest.  Heat, rest, and splinting help to decrease pain.   OBJECTIVE:   TODAY'S TREATMENT:  07/14/22 [place]: 10 min To L hand for pain management/muscle relaxation in prep for therapeutic exercises.  Neuro re-ed: Facilitated in hand manipulation skills working to pick up small pegs from table top and place into pegboard with L hand digits.  Pegs placed on washcloth for non-skid surface.  OT provided verbal and visual cues to isolate hand from upper arm movements, with pt able to replicate these normalized movement patterns when resting elbow and forearm on table top and pt practiced using neutral wrist with slight wrist extension to pick up pegs.  Pt was cued to turn pegs within fingertips to achieve correct vertical placement into pegboard, requiring  more dexterity as compared to setting the peg back down to pick it up from a different angle.  Manual Therapy: Performed edema massage to L hand digits and passive stretching at the MCP, PIP, and DIP joints of the L hand IF and MF, working to increase composite fist in the L hand.   PATIENT EDUCATION:  Education details: Continue to limit repetitive flexion of L 3rd digit and wear splint with activity to prevent triggering.  Person educated: Patient Education method: Explanation and Verbal cues Education comprehension: verbalized understanding, returned demonstration, and needs further education   HOME EXERCISE PROGRAM FMC/GMC activities for LUE    OT Short Term Goals -        OT SHORT TERM GOAL #1   Title Pt will be indep to perform LUE HEP for coordination and strengthening.    Baseline Eval: not yet initiated; 06/30/22: pt remains consistent with HEP and avoiding repetitive gripping that causes pain in L LF.   Time 6    Period Weeks    Status ongoing   Target Date 06/29/22      OT SHORT TERM GOAL #2   Title Pt to be indep with joint protection and pain management strategies to limit triggering of L hand 3rd digit PIP.    Baseline Eval: Trigger finger splint recommended, initiated joint protection educ; 06/30/22: pt is consistent to wear her trigger finger splint and avoids repetitive gripping which causes pain to L LF PIP joint, though pt is still experiencing moderate pain in this joint with occasional triggering.  Pt plans to follow up with MD next week.   Time 6    Period Weeks    Status ongoing   Target Date 06/29/22              OT Long Term Goals -       OT LONG TERM GOAL #1   Title Pt will increase FOTO by 1-2 points to indicate improvement in perceived functional indep with daily tasks.    Baseline Eval: FOTO 60; 06/30/22: 61   Time 12    Period Weeks    Status ongoing   Target Date 08/10/22      OT LONG TERM GOAL #2   Title Pt will demonstrate increased L  FMC/GMC to enable indep with putting hair in ponytail.    Baseline Eval: unable; 06/30/22: pt can hold her hair in a ponytail with BUEs but  can not yet manage the rubber band.   Time 12    Period Weeks    Status ongoing   Target Date 08/10/22      OT LONG TERM GOAL #3   Title Pt will increase L FMC to enable indep with tying shoe laces.    Baseline Eval: unable to tie laces; wears slip on shoes; 06/30/22: pt still wearing slip on shoes   Time 12    Period Weeks    Status ongoing   Target Date 08/10/22      OT LONG TERM GOAL #4   Title Pt will increase L hand dexterity skills to enable pt to pick up coins from table with L hand.    Baseline Eval: unable; 06/30/22: pt can pick up coins from table with extra time.  Dexterity skills improving as noted by improved 9 hole peg test from 5 min 38 sec to 3 min 44 sec on the L.   Time 12    Period Weeks    Status ongoing   Target Date 08/10/22      OT LONG TERM GOAL #5   Title Pt will increase L grip strength by 5 or more lbs to improve ability to hold and carry ADL supplies in L hand.    Baseline Eval: L grip 17# (R 35#); 06/30/22: L grip 10# (pain in L LF significantly limiting grip as finger continues to trigger.   Time 12    Period Weeks    Status ongoing   Target Date 08/10/22             Plan - 06/02/22 1036     Clinical Impression Statement Pt continues to report pain in L hand digits and increased swelling, but states that her compression glove is scheduled to arrive this week.  Pt responded well to paraffin tx and manual therapy to L hand for pain reduction and increasing ROM.  OT continues to reinforce importance of limiting repetitive gripping activities, with which pt shows good carry over.  Pt tolerated all therapeutic exercises well this date.  Pt responds well to verbal and visual cues for increasing more normal movement patterns throughout LUE, specifically isolating distal and proximal movements during table top grasp/release  activities.  Pt is improving with manipulation of small items within fingertips, but still having to avoid grip strengthening activities d/t trigger finger in the L LF PIP joint.  Moderate-severe dysmetria in LUE continues to limit efficient use of LUE for self care tasks.  Pt will continue to benefit from skilled OT for increasing LUE FMC/GMC skills for more efficient use of LUE during daily tasks.    OT Occupational Profile and History Problem Focused Assessment - Including review of records relating to presenting problem    Occupational performance deficits (Please refer to evaluation for details): ADL's;IADL's;Work    Body Structure / Function / Physical Skills ADL;GMC;UE functional use;FMC;Decreased knowledge of use of DME;Pain;Strength;Coordination;Dexterity;IADL;Sensation;Balance    Rehab Potential Good    Clinical Decision Making Limited treatment options, no task modification necessary    Comorbidities Affecting Occupational Performance: May have comorbidities impacting occupational performance    Modification or Assistance to Complete Evaluation  No modification of tasks or assist necessary to complete eval    OT Frequency 2x / week    OT Duration 12 weeks    OT Treatment/Interventions Self-care/ADL training;Therapeutic exercise;Therapeutic activities;Cryotherapy;Moist Heat;Neuromuscular education;DME and/or AE instruction;Manual Therapy;Splinting;Patient/family education    Plan OT focus on LUE coordination/strength training and education/splinting for  L hand 3rd digit PIP triggering    OT Home Exercise Plan Not yet initiated    Consulted and Agree with Plan of Care Patient            Leta Speller, MS, OTR/L   Darleene Cleaver, OT 07/14/2022, 4:24 PM

## 2022-07-16 ENCOUNTER — Ambulatory Visit: Payer: PPO

## 2022-07-17 ENCOUNTER — Ambulatory Visit: Payer: PPO

## 2022-07-21 ENCOUNTER — Ambulatory Visit: Payer: PPO

## 2022-07-23 ENCOUNTER — Ambulatory Visit: Payer: PPO | Admitting: Occupational Therapy

## 2022-07-23 ENCOUNTER — Encounter: Payer: Self-pay | Admitting: Occupational Therapy

## 2022-07-23 DIAGNOSIS — I6930 Unspecified sequelae of cerebral infarction: Secondary | ICD-10-CM

## 2022-07-23 DIAGNOSIS — R278 Other lack of coordination: Secondary | ICD-10-CM

## 2022-07-23 DIAGNOSIS — M6281 Muscle weakness (generalized): Secondary | ICD-10-CM

## 2022-07-23 NOTE — Therapy (Signed)
OUTPATIENT OCCUPATIONAL THERAPY TREATMENT NOTE    Patient Name: [name] MRN: [id number] DOB:28-Nov-1956, 66 y.o., female Today's Date: 07/24/2022  PCP: Dr. Birdie Sons REFERRING PROVIDER: Frann Rider, NP   OT End of Session - 07/23/22 2146     Visit Number 15    Number of Visits 24    Date for OT Re-Evaluation 08/10/22    Authorization Time Period Reporting period beginning 06/30/22    OT Start Time 1145    OT Stop Time 1231    OT Time Calculation (min) 39 min    Equipment Utilized During Treatment transport chair, SBQC    Activity Tolerance Patient tolerated treatment well    Behavior During Therapy WFL for tasks assessed/performed              Past Medical History:  Diagnosis Date   Anxiety    Arthritis    CAD (coronary artery disease)    a. 3 vessel CAD noted on coronary CTA 04/2021, s/p CABG x4   Cataract    left   Depression    Diabetes mellitus without complication ([place])    type 2   Environmental and seasonal allergies    GERD (gastroesophageal reflux disease)    Hyperlipidemia    Hypertension    Joint pain    as reported by patient   Papillary fibroelastoma of heart    of the aortic valve,  s/p AVR and tumor resection on 08/04/2021   Right thalamic infarction ([place]) 04/29/2021   S/P AVR (aortic valve replacement)    s/p AVR with resection of AV fibroelastoma on 08/04/2021   S/P CABG x 4    a. s/p CABG x4 LIMA to LAD, SVG to PDA, and Sequential SVG to OM and Diagonal on 8/   Stroke ([place]) 04/2021   admitted at [facility] - left sided weakness   Urinary incontinence    as stated by patient   Past Surgical History:  Procedure Laterality Date   AORTIC VALVE REPLACEMENT N/A 08/04/2021   Procedure: RESECTION AORTIC VALVE TUMOR;  Surgeon: [name], MD;  Location: [place];  Service: Open Heart Surgery;  Laterality: N/A;   APPLICATION OF WOUND VAC N/A 08/24/2021   Procedure: APPLICATION OF WOUND VAC;  Surgeon: Gaye Pollack, MD;   Location: [place] [facility];  Service: Thoracic;  Laterality: N/A;   APPLICATION OF WOUND VAC N/A 08/28/2021   Procedure: WOUND VAC CHANGE;  Surgeon: [name], MD;  Location: [place];  Service: Thoracic;  Laterality: N/A;   BREAST EXCISIONAL BIOPSY Right 1999   neg   BUBBLE STUDY  04/24/2021   Procedure: BUBBLE STUDY;  Surgeon: Sueanne Margarita, MD;  Location: [place];  Service: Cardiovascular;;   CATARACT EXTRACTION  12/2011   CATARACT EXTRACTION W/PHACO Left 12/02/2016   Procedure: CATARACT EXTRACTION PHACO AND INTRAOCULAR LENS PLACEMENT ([place]);  Surgeon: Estill Cotta, MD;  Location: [facility];  Service: Ophthalmology;  Laterality: Left;  [place] 2:14 AP% 28.4 CDE 59.73 Fluid pack lot # [id number]   COLONOSCOPY WITH PROPOFOL N/A 04/13/2022   Procedure: COLONOSCOPY WITH PROPOFOL;  Surgeon: Lin Landsman, MD;  Location[facility];  Service: Gastroenterology;  Laterality: N/A;   CORONARY ARTERY BYPASS GRAFT N/A 08/04/2021   Procedure: CORONARY ARTERY BYPASS GRAFTING (CABG) X  4  USING LEFT INTERNAL MAMMARY ARTERY AND RIGHT GREATER SAPHENOUS VEIN ENDOSCOPIC CONDUITS;  Surgeon: [name], MD;  Location: [place];  Service: Open Heart Surgery;  Laterality: N/A;  DIAGNOSTIC LAPAROSCOPY     EYE SURGERY     IR CT HEAD LTD  04/19/2021   IR PERCUTANEOUS ART THROMBECTOMY/INFUSION INTRACRANIAL INC DIAG ANGIO  04/19/2021       IR PERCUTANEOUS ART THROMBECTOMY/INFUSION INTRACRANIAL INC DIAG ANGIO  04/19/2021   IR US GUIDE VASC ACCESS LEFT  04/19/2021   RADIOLOGY WITH ANESTHESIA N/A 04/19/2021   Procedure: IR WITH ANESTHESIA;  Surgeon: Luanne Bras, MD;  Location: [place];  Service: Radiology;  Laterality: N/A;   STERNAL WOUND DEBRIDEMENT N/A 08/24/2021   Procedure: STERNAL WOUND DRAINAGE AND DEBRIDEMENT;  Surgeon: Gaye Pollack, MD;  Location: [place] [facility];  Service: Thoracic;  Laterality: N/A;   STERNAL WOUND DEBRIDEMENT N/A 08/28/2021   Procedure: STERNAL WOUND DEBRIDEMENT;   Surgeon: [name], MD;  Location: [place];  Service: Thoracic;  Laterality: N/A;   TEE WITHOUT CARDIOVERSION N/A 04/24/2021   Procedure: TRANSESOPHAGEAL ECHOCARDIOGRAM (TEE);  Surgeon: Sueanne Margarita, MD;  Location: [facility]OSCOPY;  Service: Cardiovascular;  Laterality: N/A;   TEE WITHOUT CARDIOVERSION N/A 08/04/2021   Procedure: TRANSESOPHAGEAL ECHOCARDIOGRAM (TEE);  Surgeon: [name], MD;  Location: [place];  Service: Open Heart Surgery;  Laterality: N/A;   TOTAL HIP ARTHROPLASTY Right 04/04/2018   Procedure: TOTAL HIP ARTHROPLASTY;  Surgeon: Dereck Leep, MD;  Location: [facility];  Service: Orthopedics;  Laterality: Right;   Patient Active Problem List   Diagnosis Date Noted   Colon cancer screening    Subcutaneous nodule    Acute blood loss anemia    Diabetic peripheral neuropathy ([place])    Debility 08/11/2021   S/P CABG x 4 08/04/2021   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   Hyperkalemia    Chronic bilateral low back pain without sciatica    Leukocytosis    Hyponatremia    Essential hypertension    Slow transit constipation    Hemiparesis affecting left side as late effect of stroke ([place])    Aortic valve mass    History of CVA with residual deficit 04/19/2021   Dyslipidemia, goal LDL below 70 04/18/2021   Hypertensive urgency 04/18/2021   Recurrent major depressive disorder, in partial remission ([place]) 09/20/2020   Status post total replacement of hip 04/04/2018   Primary osteoarthritis of right hip 02/20/2018   Allergic rhinitis 10/28/2015   Acute stress disorder 08/01/2015   Anxiety 08/01/2015   Clinical depression 08/01/2015   Diabetes ([place]) 08/01/2015   Diverticulosis of colon 08/01/2015   Generalized pruritus 08/01/2015   BP (high blood pressure) 08/01/2015   Cannot sleep 08/01/2015   Adiposity 08/01/2015   Detrusor muscle hypertonia 08/01/2015   Hypercholesterolemia without hypertriglyceridemia 08/01/2015   Female stress incontinence  08/01/2015   Avitaminosis D 08/01/2015    ONSET DATE: 04/18/2021  REFERRING DIAG: hemiparesis affecting L side as late effect of CVA; dsymetria  THERAPY DIAG:  Muscle weakness (generalized)  Other lack of coordination  History of CVA with residual deficit  Rationale for Evaluation and Treatment Rehabilitation  PERTINENT HISTORY: Pt s/p R CVA on 04/18/21. Pt was initially recovering very well from a stroke standpoint with mild LUE >LLE numbness and ambulating with a cane. 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 Therapy eval's recommended CIR for decreased functional mobility - d/c to CIR on 8/29. She required incision debridement of sternal wound and application of VAC eventually discontinued on 9/29.  She was discharged home on 9/30 (after a 32 day stay).  PRECAUTIONS: Fall  SUBJECTIVE: Tightness in left hand, edema, triggering in middle finger at times, has to pull finger out when it happens.  She had it in the past, and had injections which helped.  She thinks it was more than 10 years ago and not sure which finger.  PAIN:  Are you having pain? Yes; L LF, 8/10 pain with gripping activities, 0/10 at rest.  Heat, rest, and splinting help to decrease pain.   OBJECTIVE:   TODAY'S TREATMENT:   Paraffin: 8 min To left hand and wrist for pain management/muscle relaxation in prep for therapeutic exercises.  Neuro re-ed:  Pt seen for manipulation skills with use of [place] discs to pick up and place into grid, turning and flipping of discs with focus on isolated finger movements, pt with difficulty at times to isolate movements of index.  Able to stack up to 3 discs at a time, moving stack with picking up utilizing a lumbrical grasping pattern.   Manipulation of small 1/4 inch pegs with demonstrating difficulty with tip to tip  pinch prehension, tends to use more of a lateral pinch but responds well to cues.  Difficulty picking up from container, therapist handing pt item with cues to reinforce tip to tip pattern.  After 10 repetitions, additional pegs placed into board by therapist and pt removing with cues for tip to tip.  Increased ability to perform after repetitions and cues.   Manual Therapy: Following paraffin, therapist performed edema massage to L hand digits and passive stretching at the MCP, PIP, and DIP joints of the L hand IF and MF, working to increase composite fist in the L hand. Soft tissue mobilization and carpal spreads to encourage lymphatic flow and decrease edema.  Pt reports some triggering of 3rd digit at times, no tenderness noted this date at A1 pulley.     PATIENT EDUCATION:  Education details: Continue to limit repetitive flexion of L 3rd digit and wear splint with activity to prevent triggering.  Person educated: Patient Education method: Explanation and Verbal cues Education comprehension: verbalized understanding, returned demonstration, and needs further education   HOME EXERCISE PROGRAM FMC/GMC activities for LUE    OT Short Term Goals -        OT SHORT TERM GOAL #1   Title Pt will be indep to perform LUE HEP for coordination and strengthening.    Baseline Eval: not yet initiated; 06/30/22: pt remains consistent with HEP and avoiding repetitive gripping that causes pain in L LF.   Time 6    Period Weeks    Status ongoing   Target Date 06/29/22      OT SHORT TERM GOAL #2   Title Pt to be indep with joint protection and pain management strategies to limit triggering of L hand 3rd digit PIP.    Baseline Eval: Trigger finger splint recommended, initiated joint protection educ; 06/30/22: pt is consistent to wear her trigger finger splint and avoids repetitive gripping which causes pain to L LF PIP joint, though pt is still experiencing moderate pain in this joint with occasional  triggering.  Pt plans to follow up with MD next week.   Time 6    Period Weeks    Status ongoing   Target Date 06/29/22              OT Long Term Goals -       OT LONG TERM GOAL #1  Title Pt will increase FOTO by 1-2 points to indicate improvement in perceived functional indep with daily tasks.    Baseline Eval: FOTO 60; 06/30/22: 61   Time 12    Period Weeks    Status ongoing   Target Date 08/10/22      OT LONG TERM GOAL #2   Title Pt will demonstrate increased L FMC/GMC to enable indep with putting hair in ponytail.    Baseline Eval: unable; 06/30/22: pt can hold her hair in a ponytail with BUEs but can not yet manage the rubber band.   Time 12    Period Weeks    Status ongoing   Target Date 08/10/22      OT LONG TERM GOAL #3   Title Pt will increase L FMC to enable indep with tying shoe laces.    Baseline Eval: unable to tie laces; wears slip on shoes; 06/30/22: pt still wearing slip on shoes   Time 12    Period Weeks    Status ongoing   Target Date 08/10/22      OT LONG TERM GOAL #4   Title Pt will increase L hand dexterity skills to enable pt to pick up coins from table with L hand.    Baseline Eval: unable; 06/30/22: pt can pick up coins from table with extra time.  Dexterity skills improving as noted by improved 9 hole peg test from 5 min 38 sec to 3 min 44 sec on the L.   Time 12    Period Weeks    Status ongoing   Target Date 08/10/22      OT LONG TERM GOAL #5   Title Pt will increase L grip strength by 5 or more lbs to improve ability to hold and carry ADL supplies in L hand.    Baseline Eval: L grip 17# (R 35#); 06/30/22: L grip 10# (pain in L LF significantly limiting grip as finger continues to trigger.   Time 12    Period Weeks    Status ongoing   Target Date 08/10/22             Plan - 06/02/22 1036     Clinical Impression Statement Pt continues to report triggering at times with 3rd digit, discussed avoiding any tight gripping patterns and to  utilized more built up handles with tools/utensils, etc.  Pt continues to demonstrate difficulty with manipulation skills of the hand, decreased prehension patterns especially for tip to tip patterns which limit her ability with hand function during daily tasks.  Pt will continue to benefit from skilled OT for increasing LUE FMC/GMC skills for more efficient use of LUE during daily tasks.    OT Occupational Profile and History Problem Focused Assessment - Including review of records relating to presenting problem    Occupational performance deficits (Please refer to evaluation for details): ADL's;IADL's;Work    Body Structure / Function / Physical Skills ADL;GMC;UE functional use;FMC;Decreased knowledge of use of DME;Pain;Strength;Coordination;Dexterity;IADL;Sensation;Balance    Rehab Potential Good    Clinical Decision Making Limited treatment options, no task modification necessary    Comorbidities Affecting Occupational Performance: May have comorbidities impacting occupational performance    Modification or Assistance to Complete Evaluation  No modification of tasks or assist necessary to complete eval    OT Frequency 2x / week    OT Duration 12 weeks    OT Treatment/Interventions Self-care/ADL training;Therapeutic exercise;Therapeutic activities;Cryotherapy;Moist Heat;Neuromuscular education;DME and/or AE instruction;Manual Therapy;Splinting;Patient/family education    Plan OT focus on  LUE coordination/strength training and education/splinting for L hand 3rd digit PIP triggering    OT Home Exercise Plan Not yet initiated    Consulted and Agree with Plan of Care Patient             Safwan Tomei Colette Ribas, OT 07/24/2022, 4:26 PM

## 2022-07-27 ENCOUNTER — Ambulatory Visit: Payer: Self-pay | Admitting: Physical Medicine and Rehabilitation

## 2022-07-28 ENCOUNTER — Ambulatory Visit: Payer: PPO

## 2022-07-28 DIAGNOSIS — M6281 Muscle weakness (generalized): Secondary | ICD-10-CM

## 2022-07-28 DIAGNOSIS — R278 Other lack of coordination: Secondary | ICD-10-CM

## 2022-07-28 DIAGNOSIS — I6930 Unspecified sequelae of cerebral infarction: Secondary | ICD-10-CM

## 2022-07-29 NOTE — Therapy (Signed)
OUTPATIENT OCCUPATIONAL THERAPY TREATMENT NOTE    Patient Name: [name] MRN: [id number] DOB:08/24/1956, 66 y.o., female Today's Date: 07/29/2022  PCP: Dr. Birdie Sons REFERRING PROVIDER: Frann Rider, NP   OT End of Session - 07/29/22 0913     Visit Number 16    Number of Visits 24    Date for OT Re-Evaluation 08/10/22    Authorization Time Period Reporting period beginning 06/30/22    OT Start Time 44    OT Stop Time 1145    OT Time Calculation (min) 74 min    Equipment Utilized During Treatment transport chair, SBQC    Activity Tolerance Patient tolerated treatment well    Behavior During Therapy WFL for tasks assessed/performed              Past Medical History:  Diagnosis Date   Anxiety    Arthritis    CAD (coronary artery disease)    a. 3 vessel CAD noted on coronary CTA 04/2021, s/p CABG x4   Cataract    left   Depression    Diabetes mellitus without complication ([place])    type 2   Environmental and seasonal allergies    GERD (gastroesophageal reflux disease)    Hyperlipidemia    Hypertension    Joint pain    as reported by patient   Papillary fibroelastoma of heart    of the aortic valve,  s/p AVR and tumor resection on 08/04/2021   Right thalamic infarction ([place]) 04/29/2021   S/P AVR (aortic valve replacement)    s/p AVR with resection of AV fibroelastoma on 08/04/2021   S/P CABG x 4    a. s/p CABG x4 LIMA to LAD, SVG to PDA, and Sequential SVG to OM and Diagonal on 8/   Stroke ([place]) 04/2021   admitted at [facility] - left sided weakness   Urinary incontinence    as stated by patient   Past Surgical History:  Procedure Laterality Date   AORTIC VALVE REPLACEMENT N/A 08/04/2021   Procedure: RESECTION AORTIC VALVE TUMOR;  Surgeon: [name], MD;  Location: [place];  Service: Open Heart Surgery;  Laterality: N/A;   APPLICATION OF WOUND VAC N/A 08/24/2021   Procedure: APPLICATION OF WOUND VAC;  Surgeon: Gaye Pollack, MD;   Location: [place] [facility];  Service: Thoracic;  Laterality: N/A;   APPLICATION OF WOUND VAC N/A 08/28/2021   Procedure: WOUND VAC CHANGE;  Surgeon: [name], MD;  Location: [place];  Service: Thoracic;  Laterality: N/A;   BREAST EXCISIONAL BIOPSY Right 1999   neg   BUBBLE STUDY  04/24/2021   Procedure: BUBBLE STUDY;  Surgeon: Sueanne Margarita, MD;  Location: [place];  Service: Cardiovascular;;   CATARACT EXTRACTION  12/2011   CATARACT EXTRACTION W/PHACO Left 12/02/2016   Procedure: CATARACT EXTRACTION PHACO AND INTRAOCULAR LENS PLACEMENT ([place]);  Surgeon: Estill Cotta, MD;  Location: [facility];  Service: Ophthalmology;  Laterality: Left;  [place] 2:14 AP% 28.4 CDE 59.73 Fluid pack lot # [id number]   COLONOSCOPY WITH PROPOFOL N/A 04/13/2022   Procedure: COLONOSCOPY WITH PROPOFOL;  Surgeon: Lin Landsman, MD;  Location: [facility];  Service: Gastroenterology;  Laterality: N/A;   CORONARY ARTERY BYPASS GRAFT N/A 08/04/2021   Procedure: CORONARY ARTERY BYPASS GRAFTING (CABG) X  4  USING LEFT INTERNAL MAMMARY ARTERY AND RIGHT GREATER SAPHENOUS VEIN ENDOSCOPIC CONDUITS;  Surgeon: [name], MD;  Location: [place];  Service: Open Heart Surgery;  Laterality: N/A;  DIAGNOSTIC LAPAROSCOPY     EYE SURGERY     IR CT HEAD LTD  04/19/2021   IR PERCUTANEOUS ART THROMBECTOMY/INFUSION INTRACRANIAL INC DIAG ANGIO  04/19/2021       IR PERCUTANEOUS ART THROMBECTOMY/INFUSION INTRACRANIAL INC DIAG ANGIO  04/19/2021   IR US GUIDE VASC ACCESS LEFT  04/19/2021   RADIOLOGY WITH ANESTHESIA N/A 04/19/2021   Procedure: IR WITH ANESTHESIA;  Surgeon: Luanne Bras, MD;  Location: [place];  Service: Radiology;  Laterality: N/A;   STERNAL WOUND DEBRIDEMENT N/A 08/24/2021   Procedure: STERNAL WOUND DRAINAGE AND DEBRIDEMENT;  Surgeon: Gaye Pollack, MD;  Location: [place] [facility];  Service: Thoracic;  Laterality: N/A;   STERNAL WOUND DEBRIDEMENT N/A 08/28/2021   Procedure: STERNAL WOUND DEBRIDEMENT;   Surgeon: [name], MD;  Location: [place];  Service: Thoracic;  Laterality: N/A;   TEE WITHOUT CARDIOVERSION N/A 04/24/2021   Procedure: TRANSESOPHAGEAL ECHOCARDIOGRAM (TEE);  Surgeon: Sueanne Margarita, MD;  Location: [facility];  Service: Cardiovascular;  Laterality: N/A;   TEE WITHOUT CARDIOVERSION N/A 08/04/2021   Procedure: TRANSESOPHAGEAL ECHOCARDIOGRAM (TEE);  Surgeon: [name], MD;  Location: [place];  Service: Open Heart Surgery;  Laterality: N/A;   TOTAL HIP ARTHROPLASTY Right 04/04/2018   Procedure: TOTAL HIP ARTHROPLASTY;  Surgeon: Dereck Leep, MD;  Location: [facility];  Service: Orthopedics;  Laterality: Right;   Patient Active Problem List   Diagnosis Date Noted   Colon cancer screening    Subcutaneous nodule    Acute blood loss anemia    Diabetic peripheral neuropathy ([place])    Debility 08/11/2021   S/P CABG x 4 08/04/2021   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   Hyperkalemia    Chronic bilateral low back pain without sciatica    Leukocytosis    Hyponatremia    Essential hypertension    Slow transit constipation    Hemiparesis affecting left side as late effect of stroke ([place])    Aortic valve mass    History of CVA with residual deficit 04/19/2021   Dyslipidemia, goal LDL below 70 04/18/2021   Hypertensive urgency 04/18/2021   Recurrent major depressive disorder, in partial remission ([place]) 09/20/2020   Status post total replacement of hip 04/04/2018   Primary osteoarthritis of right hip 02/20/2018   Allergic rhinitis 10/28/2015   Acute stress disorder 08/01/2015   Anxiety 08/01/2015   Clinical depression 08/01/2015   Diabetes ([place]) 08/01/2015   Diverticulosis of colon 08/01/2015   Generalized pruritus 08/01/2015   BP (high blood pressure) 08/01/2015   Cannot sleep 08/01/2015   Adiposity 08/01/2015   Detrusor muscle hypertonia 08/01/2015   Hypercholesterolemia without hypertriglyceridemia 08/01/2015   Female stress incontinence  08/01/2015   Avitaminosis D 08/01/2015    ONSET DATE: 04/18/2021  REFERRING DIAG: hemiparesis affecting L side as late effect of CVA; dsymetria  THERAPY DIAG:  Muscle weakness (generalized)  Other lack of coordination  History of CVA with residual deficit  Rationale for Evaluation and Treatment Rehabilitation  PERTINENT HISTORY: Pt s/p R CVA on 04/18/21. Pt was initially recovering very well from a stroke standpoint with mild LUE >LLE numbness and ambulating with a cane. 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 Therapy eval's recommended CIR for decreased functional mobility - d/c to CIR on 8/29. She required incision debridement of sternal wound and application of VAC eventually discontinued on 9/29.  She was discharged home on 9/30 (after a 32 day stay).  PRECAUTIONS: Fall  SUBJECTIVE: Pt states that her compression glove seems to be helping.    PAIN:  Are you having pain? Yes; L LF, 8/10 pain with gripping activities, 0/10 at rest.  Heat, rest, and splinting help to decrease pain.   OBJECTIVE:   TODAY'S TREATMENT:  07/14/22 [place]: 10 min To L hand for pain management/muscle relaxation in prep for therapeutic exercises.  Therapeutic Exercise: Performed passive stretching at the MCP, PIP, and DIP joints of the L hand IF and MF, working to increase composite fist in the L hand.  Neuro re-ed: Pt seen for manipulation skills with use of [place] discs to pick up and place into grid, turning and flipping of discs with focus on isolated finger movements, pt with difficulty at times to isolate movements of index.  Pt placed 20 discs into grid at table top level, then progressed to placing discs onto grid on an incline to further challenge GMC/FMC.  Increased dropping of discs on an incline but able to complete all 20.     PATIENT EDUCATION:  Education details: Continue to limit repetitive flexion of L 3rd digit and wear splint with activity to prevent triggering.  Person educated: Patient Education method: Explanation and Verbal cues Education comprehension: verbalized understanding, returned demonstration, and needs further education   HOME EXERCISE PROGRAM FMC/GMC activities for LUE    OT Short Term Goals -        OT SHORT TERM GOAL #1   Title Pt will be indep to perform LUE HEP for coordination and strengthening.    Baseline Eval: not yet initiated; 06/30/22: pt remains consistent with HEP and avoiding repetitive gripping that causes pain in L LF.   Time 6    Period Weeks    Status ongoing   Target Date 06/29/22      OT SHORT TERM GOAL #2   Title Pt to be indep with joint protection and pain management strategies to limit triggering of L hand 3rd digit PIP.    Baseline Eval: Trigger finger splint recommended, initiated joint protection educ; 06/30/22: pt is consistent to wear her trigger finger splint and avoids repetitive gripping which causes pain to L LF PIP joint, though pt is still experiencing moderate pain in this joint with occasional triggering.  Pt plans to follow up with MD next week.   Time 6    Period Weeks    Status ongoing   Target Date 06/29/22              OT Long Term Goals -       OT LONG TERM GOAL #1   Title Pt will increase FOTO by 1-2 points to indicate improvement in perceived functional indep with daily tasks.    Baseline Eval: FOTO 60; 06/30/22: 61   Time 12    Period Weeks    Status ongoing   Target Date 08/10/22      OT LONG TERM GOAL #2   Title Pt will demonstrate increased L FMC/GMC to enable indep with putting hair in ponytail.    Baseline Eval: unable; 06/30/22: pt can hold her hair in a ponytail with BUEs but can not yet manage the rubber band.   Time 12    Period Weeks    Status ongoing   Target Date 08/10/22      OT LONG TERM GOAL #3   Title Pt  will increase L FMC to enable  indep with tying shoe laces.    Baseline Eval: unable to tie laces; wears slip on shoes; 06/30/22: pt still wearing slip on shoes   Time 12    Period Weeks    Status ongoing   Target Date 08/10/22      OT LONG TERM GOAL #4   Title Pt will increase L hand dexterity skills to enable pt to pick up coins from table with L hand.    Baseline Eval: unable; 06/30/22: pt can pick up coins from table with extra time.  Dexterity skills improving as noted by improved 9 hole peg test from 5 min 38 sec to 3 min 44 sec on the L.   Time 12    Period Weeks    Status ongoing   Target Date 08/10/22      OT LONG TERM GOAL #5   Title Pt will increase L grip strength by 5 or more lbs to improve ability to hold and carry ADL supplies in L hand.    Baseline Eval: L grip 17# (R 35#); 06/30/22: L grip 10# (pain in L LF significantly limiting grip as finger continues to trigger.   Time 12    Period Weeks    Status ongoing   Target Date 08/10/22             Plan - 06/02/22 1036     Clinical Impression Statement Pt arrived with compression glove this date and stated that she feels like it has helped her fingers.  Pt responded well to paraffin tx and passive stretching to L hand for increasing ROM, specifically in the L hand IF and LF (all joints).  Pt is improving with manipulation of small items within fingertips, but still having to avoid grip strengthening activities d/t trigger finger in the L LF PIP joint.  Pt reports that she is using her LUE more at home with daily tasks.  OT encouraged pt follow up with MD office regarding next steps for her Vivistim eval.  Pt stated that she would call MD office today.  Moderate-severe dysmetria in LUE continues to limit efficient use of LUE for self care tasks.  Pt will continue to benefit from skilled OT for increasing LUE FMC/GMC skills for more efficient use of LUE during daily tasks.    OT Occupational Profile and History Problem Focused  Assessment - Including review of records relating to presenting problem    Occupational performance deficits (Please refer to evaluation for details): ADL's;IADL's;Work    Body Structure / Function / Physical Skills ADL;GMC;UE functional use;FMC;Decreased knowledge of use of DME;Pain;Strength;Coordination;Dexterity;IADL;Sensation;Balance    Rehab Potential Good    Clinical Decision Making Limited treatment options, no task modification necessary    Comorbidities Affecting Occupational Performance: May have comorbidities impacting occupational performance    Modification or Assistance to Complete Evaluation  No modification of tasks or assist necessary to complete eval    OT Frequency 2x / week    OT Duration 12 weeks    OT Treatment/Interventions Self-care/ADL training;Therapeutic exercise;Therapeutic activities;Cryotherapy;Moist Heat;Neuromuscular education;DME and/or AE instruction;Manual Therapy;Splinting;Patient/family education    Plan OT focus on LUE coordination/strength training and education/splinting for L hand 3rd digit PIP triggering    OT Home Exercise Plan Not yet initiated    Consulted and Agree with Plan of Care Patient            Leta Speller, MS, OTR/L   Darleene Cleaver, OT 07/29/2022, 9:15 AM

## 2022-07-30 ENCOUNTER — Telehealth: Payer: Self-pay

## 2022-07-30 ENCOUNTER — Ambulatory Visit: Payer: PPO

## 2022-07-30 NOTE — Telephone Encounter (Signed)
Patient called stating she needed her disability papers filled out to extend it. She also wanted to know her status on the Vivistim. Spoke with Dr. Ranell Patrick and she stated the referral was sent to OT and if she is a good candidate, they will contact her. Called patient and informed her of the information and that she needs to get the paperwork sent to our clinic. She stated [facility] was faxing over the paperwork.

## 2022-08-04 ENCOUNTER — Ambulatory Visit: Payer: PPO

## 2022-08-04 ENCOUNTER — Other Ambulatory Visit (HOSPITAL_COMMUNITY): Payer: Self-pay

## 2022-08-04 DIAGNOSIS — M6281 Muscle weakness (generalized): Secondary | ICD-10-CM

## 2022-08-04 DIAGNOSIS — I6930 Unspecified sequelae of cerebral infarction: Secondary | ICD-10-CM

## 2022-08-04 DIAGNOSIS — R278 Other lack of coordination: Secondary | ICD-10-CM

## 2022-08-05 NOTE — Therapy (Signed)
OUTPATIENT OCCUPATIONAL THERAPY TREATMENT NOTE    Patient Name: [name] MRN: [id number] DOB:March 11, 1956, 66 y.o., female Today's Date: 08/05/2022  PCP: Dr. Birdie Sons REFERRING PROVIDER: Frann Rider, NP   OT End of Session - 08/04/22 1305     Visit Number 17    Number of Visits 24    Date for OT Re-Evaluation 08/10/22    Authorization Time Period Reporting period beginning 06/30/22    OT Start Time 45    OT Stop Time 1345    OT Time Calculation (min) 13 min    Equipment Utilized During Treatment transport chair, SBQC    Activity Tolerance Patient tolerated treatment well    Behavior During Therapy WFL for tasks assessed/performed              Past Medical History:  Diagnosis Date   Anxiety    Arthritis    CAD (coronary artery disease)    a. 3 vessel CAD noted on coronary CTA 04/2021, s/p CABG x4   Cataract    left   Depression    Diabetes mellitus without complication ([place])    type 2   Environmental and seasonal allergies    GERD (gastroesophageal reflux disease)    Hyperlipidemia    Hypertension    Joint pain    as reported by patient   Papillary fibroelastoma of heart    of the aortic valve,  s/p AVR and tumor resection on 08/04/2021   Right thalamic infarction ([place]) 04/29/2021   S/P AVR (aortic valve replacement)    s/p AVR with resection of AV fibroelastoma on 08/04/2021   S/P CABG x 4    a. s/p CABG x4 LIMA to LAD, SVG to PDA, and Sequential SVG to OM and Diagonal on 8/   Stroke ([place]) 04/2021   admitted at [facility] - left sided weakness   Urinary incontinence    as stated by patient   Past Surgical History:  Procedure Laterality Date   AORTIC VALVE REPLACEMENT N/A 08/04/2021   Procedure: RESECTION AORTIC VALVE TUMOR;  Surgeon: [name], MD;  Location: [place];  Service: Open Heart Surgery;  Laterality: N/A;   APPLICATION OF WOUND VAC N/A 08/24/2021   Procedure: APPLICATION OF WOUND VAC;  Surgeon: Gaye Pollack, MD;   Location: [place] [facility];  Service: Thoracic;  Laterality: N/A;   APPLICATION OF WOUND VAC N/A 08/28/2021   Procedure: WOUND VAC CHANGE;  Surgeon: [name], MD;  Location: [place];  Service: Thoracic;  Laterality: N/A;   BREAST EXCISIONAL BIOPSY Right 1999   neg   BUBBLE STUDY  04/24/2021   Procedure: BUBBLE STUDY;  Surgeon: Sueanne Margarita, MD;  Location: [place];  Service: Cardiovascular;;   CATARACT EXTRACTION  12/2011   CATARACT EXTRACTION W/PHACO Left 12/02/2016   Procedure: CATARACT EXTRACTION PHACO AND INTRAOCULAR LENS PLACEMENT ([place]);  Surgeon: Estill Cotta, MD;  Location: [facility];  Service: Ophthalmology;  Laterality: Left;  [place] 2:14 AP% 28.4 CDE 59.73 Fluid pack lot # [id number]   COLONOSCOPY WITH PROPOFOL N/A 04/13/2022   Procedure: COLONOSCOPY WITH PROPOFOL;  Surgeon: Lin Landsman, MD;  Location: [facility];  Service: Gastroenterology;  Laterality: N/A;   CORONARY ARTERY BYPASS GRAFT N/A 08/04/2021   Procedure: CORONARY ARTERY BYPASS GRAFTING (CABG) X  4  USING LEFT INTERNAL MAMMARY ARTERY AND RIGHT GREATER SAPHENOUS VEIN ENDOSCOPIC CONDUITS;  Surgeon: [name], MD;  Location: [place];  Service: Open Heart Surgery;  Laterality: N/A;  DIAGNOSTIC LAPAROSCOPY     EYE SURGERY     IR CT HEAD LTD  04/19/2021   IR PERCUTANEOUS ART THROMBECTOMY/INFUSION INTRACRANIAL INC DIAG ANGIO  04/19/2021       IR PERCUTANEOUS ART THROMBECTOMY/INFUSION INTRACRANIAL INC DIAG ANGIO  04/19/2021   IR US GUIDE VASC ACCESS LEFT  04/19/2021   RADIOLOGY WITH ANESTHESIA N/A 04/19/2021   Procedure: IR WITH ANESTHESIA;  Surgeon: Luanne Bras, MD;  Location: [place];  Service: Radiology;  Laterality: N/A;   STERNAL WOUND DEBRIDEMENT N/A 08/24/2021   Procedure: STERNAL WOUND DRAINAGE AND DEBRIDEMENT;  Surgeon: Gaye Pollack, MD;  Location: [place] [facility];  Service: Thoracic;  Laterality: N/A;   STERNAL WOUND DEBRIDEMENT N/A 08/28/2021   Procedure: STERNAL WOUND DEBRIDEMENT;   Surgeon: [name], MD;  Location: [place];  Service: Thoracic;  Laterality: N/A;   TEE WITHOUT CARDIOVERSION N/A 04/24/2021   Procedure: TRANSESOPHAGEAL ECHOCARDIOGRAM (TEE);  Surgeon: Sueanne Margarita, MD;  Location: [facility];  Service: Cardiovascular;  Laterality: N/A;   TEE WITHOUT CARDIOVERSION N/A 08/04/2021   Procedure: TRANSESOPHAGEAL ECHOCARDIOGRAM (TEE);  Surgeon: [name], MD;  Location: [place];  Service: Open Heart Surgery;  Laterality: N/A;   TOTAL HIP ARTHROPLASTY Right 04/04/2018   Procedure: TOTAL HIP ARTHROPLASTY;  Surgeon: Dereck Leep, MD;  Location: [facility];  Service: Orthopedics;  Laterality: Right;   Patient Active Problem List   Diagnosis Date Noted   Colon cancer screening    Subcutaneous nodule    Acute blood loss anemia    Diabetic peripheral neuropathy ([place])    Debility 08/11/2021   S/P CABG x 4 08/04/2021   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   Hyperkalemia    Chronic bilateral low back pain without sciatica    Leukocytosis    Hyponatremia    Essential hypertension    Slow transit constipation    Hemiparesis affecting left side as late effect of stroke ([place])    Aortic valve mass    History of CVA with residual deficit 04/19/2021   Dyslipidemia, goal LDL below 70 04/18/2021   Hypertensive urgency 04/18/2021   Recurrent major depressive disorder, in partial remission ([place]) 09/20/2020   Status post total replacement of hip 04/04/2018   Primary osteoarthritis of right hip 02/20/2018   Allergic rhinitis 10/28/2015   Acute stress disorder 08/01/2015   Anxiety 08/01/2015   Clinical depression 08/01/2015   Diabetes ([place]) 08/01/2015   Diverticulosis of colon 08/01/2015   Generalized pruritus 08/01/2015   BP (high blood pressure) 08/01/2015   Cannot sleep 08/01/2015   Adiposity 08/01/2015   Detrusor muscle hypertonia 08/01/2015   Hypercholesterolemia without hypertriglyceridemia 08/01/2015   Female stress incontinence  08/01/2015   Avitaminosis D 08/01/2015    ONSET DATE: 04/18/2021  REFERRING DIAG: hemiparesis affecting L side as late effect of CVA; dsymetria  THERAPY DIAG:  Muscle weakness (generalized)  Other lack of coordination  History of CVA with residual deficit  Rationale for Evaluation and Treatment Rehabilitation  PERTINENT HISTORY: Pt s/p R CVA on 04/18/21. Pt was initially recovering very well from a stroke standpoint with mild LUE >LLE numbness and ambulating with a cane. 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 Therapy eval's recommended CIR for decreased functional mobility - d/c to CIR on 8/29. She required incision debridement of sternal wound and application of VAC eventually discontinued on 9/29.  She was discharged home on 9/30 (after a 32 day stay).  PRECAUTIONS: Fall  SUBJECTIVE: Pt arrived and became slightly tearful as she has some anxiety about her grandson's upcoming heart sx.    PAIN:  Are you having pain? Yes; L LF, 8/10 pain with gripping activities, 0/10 at rest.  Heat, rest, and splinting help to decrease pain.   OBJECTIVE:   TODAY'S TREATMENT:  07/14/22 [place]: 10 min To L hand for pain management/muscle relaxation in prep for therapeutic exercises.  Therapeutic Exercise: Performed passive stretching at the MCP, PIP, and DIP joints of the L hand IF and MF, working to increase composite fist in the L hand.  Neuro re-ed: Facilitated L [place] working to pick up ball pegs with L hand and place them in pegboard.  Incorporated Cable having pt reach at shoulder level to grap a peg from OT and used 2 and 3 point pinching grasp patterns.  Practiced smaller item manipulation skills working to pick up washers from dish and place over vertical dowels.  OT challenged forward and lateral reaching with LUE.     PATIENT  EDUCATION:  Education details: Continue to limit repetitive flexion of L 3rd digit and wear splint with activity to prevent triggering.  Person educated: Patient Education method: Explanation and Verbal cues Education comprehension: verbalized understanding, returned demonstration, and needs further education   HOME EXERCISE PROGRAM FMC/GMC activities for LUE    OT Short Term Goals -        OT SHORT TERM GOAL #1   Title Pt will be indep to perform LUE HEP for coordination and strengthening.    Baseline Eval: not yet initiated; 06/30/22: pt remains consistent with HEP and avoiding repetitive gripping that causes pain in L LF.   Time 6    Period Weeks    Status ongoing   Target Date 06/29/22      OT SHORT TERM GOAL #2   Title Pt to be indep with joint protection and pain management strategies to limit triggering of L hand 3rd digit PIP.    Baseline Eval: Trigger finger splint recommended, initiated joint protection educ; 06/30/22: pt is consistent to wear her trigger finger splint and avoids repetitive gripping which causes pain to L LF PIP joint, though pt is still experiencing moderate pain in this joint with occasional triggering.  Pt plans to follow up with MD next week.   Time 6    Period Weeks    Status ongoing   Target Date 06/29/22              OT Long Term Goals -       OT LONG TERM GOAL #1   Title Pt will increase FOTO by 1-2 points to indicate improvement in perceived functional indep with daily tasks.    Baseline Eval: FOTO 60; 06/30/22: 61   Time 12    Period Weeks    Status ongoing   Target Date 08/10/22      OT LONG TERM GOAL #2   Title Pt will demonstrate increased L FMC/GMC to enable indep with putting hair in ponytail.    Baseline Eval: unable; 06/30/22: pt can hold her hair in a ponytail with BUEs but can not yet manage the rubber band.   Time 12    Period Weeks    Status ongoing   Target Date 08/10/22      OT LONG TERM GOAL #3   Title Pt will  increase L FMC to enable indep  with tying shoe laces.    Baseline Eval: unable to tie laces; wears slip on shoes; 06/30/22: pt still wearing slip on shoes   Time 12    Period Weeks    Status ongoing   Target Date 08/10/22      OT LONG TERM GOAL #4   Title Pt will increase L hand dexterity skills to enable pt to pick up coins from table with L hand.    Baseline Eval: unable; 06/30/22: pt can pick up coins from table with extra time.  Dexterity skills improving as noted by improved 9 hole peg test from 5 min 38 sec to 3 min 44 sec on the L.   Time 12    Period Weeks    Status ongoing   Target Date 08/10/22      OT LONG TERM GOAL #5   Title Pt will increase L grip strength by 5 or more lbs to improve ability to hold and carry ADL supplies in L hand.    Baseline Eval: L grip 17# (R 35#); 06/30/22: L grip 10# (pain in L LF significantly limiting grip as finger continues to trigger.   Time 12    Period Weeks    Status ongoing   Target Date 08/10/22             Plan - 06/02/22 1036     Clinical Impression Statement Pt has cortisone injection for the L 3rd digit scheduled on 8/29.  Pt continues to limit repetitive gripping d/t pain and triggering in the PIP joint of this finger.  Pt responded well to paraffin tx and passive stretching to L hand for increasing ROM, specifically in the L hand IF and LF (all joints).  Pt is improving with translatory movements with the L hand, and is improving with accuracy when reaching toward a target.  Pt continues to report that she is using her LUE more at home with daily tasks.  Pt followed up with her PCP office re: Vivistim eval and pt states that they are working on approval with insurance to move forward.   Moderate dysmetria in LUE continues to limit efficient use of LUE for self care tasks.  Pt will continue to benefit from skilled OT for increasing LUE FMC/GMC skills for more efficient use of LUE during daily tasks.    OT Occupational Profile and  History Problem Focused Assessment - Including review of records relating to presenting problem    Occupational performance deficits (Please refer to evaluation for details): ADL's;IADL's;Work    Body Structure / Function / Physical Skills ADL;GMC;UE functional use;FMC;Decreased knowledge of use of DME;Pain;Strength;Coordination;Dexterity;IADL;Sensation;Balance    Rehab Potential Good    Clinical Decision Making Limited treatment options, no task modification necessary    Comorbidities Affecting Occupational Performance: May have comorbidities impacting occupational performance    Modification or Assistance to Complete Evaluation  No modification of tasks or assist necessary to complete eval    OT Frequency 2x / week    OT Duration 12 weeks    OT Treatment/Interventions Self-care/ADL training;Therapeutic exercise;Therapeutic activities;Cryotherapy;Moist Heat;Neuromuscular education;DME and/or AE instruction;Manual Therapy;Splinting;Patient/family education    Plan OT focus on LUE coordination/strength training and education/splinting for L hand 3rd digit PIP triggering    OT Home Exercise Plan Not yet initiated    Consulted and Agree with Plan of Care Patient            Leta Speller, MS, OTR/L   Darleene Cleaver, OT 08/05/2022, 1:49 PM

## 2022-08-06 ENCOUNTER — Ambulatory Visit: Payer: PPO

## 2022-08-11 ENCOUNTER — Ambulatory Visit: Payer: PPO

## 2022-08-11 ENCOUNTER — Encounter: Payer: Self-pay | Admitting: Physical Medicine and Rehabilitation

## 2022-08-11 ENCOUNTER — Encounter: Payer: PPO | Attending: Physical Medicine and Rehabilitation | Admitting: Physical Medicine and Rehabilitation

## 2022-08-11 DIAGNOSIS — M6530 Trigger finger, unspecified finger: Secondary | ICD-10-CM | POA: Insufficient documentation

## 2022-08-11 MED ORDER — BETAMETHASONE SOD PHOS & ACET 6 (3-3) MG/ML IJ SUSP
3.0000 mg | Freq: Once | INTRAMUSCULAR | Status: AC
  Administered 2022-08-11: 3 mg via INTRAMUSCULAR

## 2022-08-11 MED ORDER — LIDOCAINE HCL 1 % IJ SOLN
0.5000 mL | Freq: Once | INTRAMUSCULAR | Status: AC
  Administered 2022-08-11: 0.5 mL

## 2022-08-11 NOTE — Addendum Note (Signed)
Addended by: Casilda Carls on: 08/11/2022 12:41 PM   Modules accepted: Orders

## 2022-08-11 NOTE — Progress Notes (Signed)
After informed consent and preparation of the skin with betadine and isopropyl alcohol, I injected '3mg'$  (0.5cc) of celestone and 0.5cc of 1% lidocaine around the finger flexor tendon at the A1 pulley via anterior approach. Additionally, aspiration was performed prior to injection. The patient tolerated well, and no complications were encountered. Afterward the area was cleaned and dressed. Post- injection instructions were provided.

## 2022-08-13 ENCOUNTER — Ambulatory Visit: Payer: PPO

## 2022-08-18 ENCOUNTER — Other Ambulatory Visit: Payer: Self-pay

## 2022-08-18 ENCOUNTER — Other Ambulatory Visit (HOSPITAL_COMMUNITY): Payer: Self-pay

## 2022-08-18 ENCOUNTER — Telehealth: Payer: Self-pay

## 2022-08-18 ENCOUNTER — Ambulatory Visit: Payer: PPO | Attending: Adult Health

## 2022-08-18 DIAGNOSIS — R278 Other lack of coordination: Secondary | ICD-10-CM | POA: Insufficient documentation

## 2022-08-18 DIAGNOSIS — I6381 Other cerebral infarction due to occlusion or stenosis of small artery: Secondary | ICD-10-CM | POA: Insufficient documentation

## 2022-08-18 DIAGNOSIS — I6930 Unspecified sequelae of cerebral infarction: Secondary | ICD-10-CM | POA: Diagnosis not present

## 2022-08-18 DIAGNOSIS — M6281 Muscle weakness (generalized): Secondary | ICD-10-CM | POA: Diagnosis not present

## 2022-08-18 NOTE — Therapy (Signed)
OUTPATIENT OCCUPATIONAL THERAPY RECERTIFICATION NOTE    Patient Name: [name] MRN: [id number] DOB:[birth year], 66 y.o., female Today's Date: 08/18/2022  PCP: Dr. Birdie Sons REFERRING PROVIDER: Frann Rider, NP      Past Medical History:  Diagnosis Date   Anxiety    Arthritis    CAD (coronary artery disease)    a. 3 vessel CAD noted on coronary CTA 04/2021, s/p CABG x4   Cataract    left   Depression    Diabetes mellitus without complication ([place])    type 2   Environmental and seasonal allergies    GERD (gastroesophageal reflux disease)    Hyperlipidemia    Hypertension    Joint pain    as reported by patient   Papillary fibroelastoma of heart    of the aortic valve,  s/p AVR and tumor resection on 08/04/2021   Right thalamic infarction ([place]) 04/29/2021   S/P AVR (aortic valve replacement)    s/p AVR with resection of AV fibroelastoma on 08/04/2021   S/P CABG x 4    a. s/p CABG x4 LIMA to LAD, SVG to PDA, and Sequential SVG to OM and Diagonal on 8/   Stroke ([place]) 04/2021   admitted at [facility] - left sided weakness   Urinary incontinence    as stated by patient   Past Surgical History:  Procedure Laterality Date   AORTIC VALVE REPLACEMENT N/A 08/04/2021   Procedure: RESECTION AORTIC VALVE TUMOR;  Surgeon: [name], MD;  Location: [place];  Service: Open Heart Surgery;  Laterality: N/A;   APPLICATION OF WOUND VAC N/A 08/24/2021   Procedure: APPLICATION OF WOUND VAC;  Surgeon: Gaye Pollack, MD;  Location: [place] OR;  Service: Thoracic;  Laterality: N/A;   APPLICATION OF WOUND VAC N/A 08/28/2021   Procedure: WOUND VAC CHANGE;  Surgeon: [name], MD;  Location: [place];  Service: Thoracic;  Laterality: N/A;   BREAST EXCISIONAL BIOPSY Right 1999   neg   BUBBLE STUDY  04/24/2021   Procedure: BUBBLE STUDY;  Surgeon: Sueanne Margarita, MD;  Location: [place];  Service: Cardiovascular;;   CATARACT EXTRACTION  12/2011   CATARACT  EXTRACTION W/PHACO Left 12/02/2016   Procedure: CATARACT EXTRACTION PHACO AND INTRAOCULAR LENS PLACEMENT ([place]);  Surgeon: Estill Cotta, MD;  Location: [facility]S;  Service: Ophthalmology;  Laterality: Left;  [place] 2:14 AP% 28.4 CDE 59.73 Fluid pack lot # [id number]   COLONOSCOPY WITH PROPOFOL N/A 04/13/2022   Procedure: COLONOSCOPY WITH PROPOFOL;  Surgeon: Lin Landsman, MD;  Location: [facility] ENDOSCOPY;  Service: Gastroenterology;  Laterality: N/A;   CORONARY ARTERY BYPASS GRAFT N/A 08/04/2021   Procedure: CORONARY ARTERY BYPASS GRAFTING (CABG) X  4  USING LEFT INTERNAL MAMMARY ARTERY AND RIGHT GREATER SAPHENOUS VEIN ENDOSCOPIC CONDUITS;  Surgeon: [name], MD;  Location: [place];  Service: Open Heart Surgery;  Laterality: N/A;   DIAGNOSTIC LAPAROSCOPY     EYE SURGERY     IR CT HEAD LTD  04/19/2021   IR PERCUTANEOUS ART THROMBECTOMY/INFUSION INTRACRANIAL INC DIAG ANGIO  04/19/2021       IR PERCUTANEOUS ART THROMBECTOMY/INFUSION INTRACRANIAL INC DIAG ANGIO  04/19/2021   IR US GUIDE VASC ACCESS LEFT  04/19/2021   RADIOLOGY WITH ANESTHESIA N/A 04/19/2021   Procedure: IR WITH ANESTHESIA;  Surgeon: Luanne Bras, MD;  Location: [place];  Service: Radiology;  Laterality: N/A;   STERNAL WOUND DEBRIDEMENT N/A 08/24/2021   Procedure: STERNAL WOUND DRAINAGE AND DEBRIDEMENT;  Surgeon:  Gaye Pollack, MD;  Location: [facility] OR;  Service: Thoracic;  Laterality: N/A;   STERNAL WOUND DEBRIDEMENT N/A 08/28/2021   Procedure: STERNAL WOUND DEBRIDEMENT;  Surgeon: [name], MD;  Location: [place];  Service: Thoracic;  Laterality: N/A;   TEE WITHOUT CARDIOVERSION N/A 04/24/2021   Procedure: TRANSESOPHAGEAL ECHOCARDIOGRAM (TEE);  Surgeon: Sueanne Margarita, MD;  Location: [facility] ENDOSCOPY;  Service: Cardiovascular;  Laterality: N/A;   TEE WITHOUT CARDIOVERSION N/A 08/04/2021   Procedure: TRANSESOPHAGEAL ECHOCARDIOGRAM (TEE);  Surgeon: [name], MD;  Location: [place];  Service: Open  Heart Surgery;  Laterality: N/A;   TOTAL HIP ARTHROPLASTY Right 04/04/2018   Procedure: TOTAL HIP ARTHROPLASTY;  Surgeon: Dereck Leep, MD;  Location: [facility]S;  Service: Orthopedics;  Laterality: Right;   Patient Active Problem List   Diagnosis Date Noted   Colon cancer screening    Subcutaneous nodule    Acute blood loss anemia    Diabetic peripheral neuropathy ([place])    Debility 08/11/2021   S/P CABG x 4 08/04/2021   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   Hyperkalemia    Chronic bilateral low back pain without sciatica    Leukocytosis    Hyponatremia    Essential hypertension    Slow transit constipation    Hemiparesis affecting left side as late effect of stroke ([place])    Aortic valve mass    History of CVA with residual deficit 04/19/2021   Dyslipidemia, goal LDL below 70 04/18/2021   Hypertensive urgency 04/18/2021   Recurrent major depressive disorder, in partial remission ([place]) 09/20/2020   Status post total replacement of hip 04/04/2018   Primary osteoarthritis of right hip 02/20/2018   Allergic rhinitis 10/28/2015   Acute stress disorder 08/01/2015   Anxiety 08/01/2015   Clinical depression 08/01/2015   Diabetes ([place]) 08/01/2015   Diverticulosis of colon 08/01/2015   Generalized pruritus 08/01/2015   BP (high blood pressure) 08/01/2015   Cannot sleep 08/01/2015   Adiposity 08/01/2015   Detrusor muscle hypertonia 08/01/2015   Hypercholesterolemia without hypertriglyceridemia 08/01/2015   Female stress incontinence 08/01/2015   Avitaminosis D 08/01/2015    ONSET DATE: 04/18/2021  REFERRING DIAG: hemiparesis affecting L side as late effect of CVA; dsymetria  THERAPY DIAG:  No diagnosis found.  Rationale for Evaluation and Treatment Rehabilitation  PERTINENT HISTORY: Pt s/p R CVA on 04/18/21. Pt was initially recovering very well from a stroke standpoint with mild LUE >LLE numbness and ambulating with a cane. As recovering well, on 08/04/2021 underwent  CABG x4 and underwent resection of aortic valve mass most likely papillary fibroblastoma as well as endoscopic harvest of greater saphenous vein from right leg. Therapy eval's recommended CIR for decreased functional mobility - d/c to CIR on 8/29. She required incision debridement of sternal wound and application of VAC eventually discontinued on 9/29. She was discharged home on 9/30 (after a 32 day stay).  PRECAUTIONS: Fall  SUBJECTIVE: Pt states that her pain is less since her cortisone injection in her hand, but she states her finger seems to be triggering more.  Pt states she has a follow up in 3 weeks for her finger.  PAIN:  Are you having pain? Yes; L LF, 3/10 pain with gripping activities, 0/10 at rest.  Heat, rest, and splinting help to decrease pain.    OBJECTIVE:    [facility] OT Assessment - 07/01/22 0001       07/01/22 08/18/22          Observation/Other Assessments  Focus on Therapeutic Outcomes (FOTO)  61  59          Coordination     Gross Motor Movements are Fluid and Coordinated No  No (improving)    Fine Motor Movements are Fluid and Coordinated No  No (improving)    Right 9 Hole Peg Test 31 sec  NT    Left 9 Hole Peg Test 3 min 44 sec   improved from 5 min 38 sec 2 min 50 sec (improved from 3 min 44 sec)          Hand Function     Right Hand Grip (lbs) 35  NT    Right Hand Lateral Pinch 10 lbs  NT    Right Hand 3 Point Pinch 7 lbs  NT    Left Hand Grip (lbs) 10   decline (L LF pain from triggering limiting grip strength) 23#    Left Hand Lateral Pinch 6 lbs  8#    Left 3 point pinch 5 lbs   finger slipping 8#      TODAY'S TREATMENT:  07/14/22 Par[place]: 10 min To L hand for pain management/muscle relaxation in prep for therapeutic exercises.  Therapeutic Exercise: Objective measures taken and goals updated for recert.  Performed passive stretching at the MCP, PIP, and DIP joints of the L hand IF and MF, working to increase composite fist in the L hand.  Neuro  re-ed: Facilitated L hand dexterity skills working with circular wooden blocks for flipping, and Van working to [employer].  Pt required cues for isolated finger movements to minimize compensatory forearm rotation when working to flip blocks on table top.  Pt required cues to stabilize LUE with elbow on table top for better distal control to enable stacking blocks.    PATIENT EDUCATION:  Education details: Continue to limit repetitive flexion of L 3rd digit and wear splint with activity to prevent triggering.  Person educated: Patient Education method: Explanation and Verbal cues Education comprehension: verbalized understanding, returned demonstration, and needs further education   HOME EXERCISE PROGRAM FMC/GMC activities for LUE    OT Short Term Goals -        OT SHORT TERM GOAL #1   Title Pt will be indep to perform LUE HEP for coordination and strengthening.    Baseline Eval: not yet initiated; 06/30/22: pt remains consistent with HEP and avoiding repetitive gripping that causes pain in L LF; 08/18/22: pt remains consistent to engage LUE; HEP ongoing as function improves   Time 6    Period Weeks    Status ongoing   Target Date 09/29/22     OT SHORT TERM GOAL #2   Title Pt to be indep with joint protection and pain management strategies to limit triggering of L hand 3rd digit PIP.    Baseline Eval: Trigger finger splint recommended, initiated joint protection educ; 06/30/22: pt is consistent to wear her trigger finger splint and avoids repetitive gripping which causes pain to L LF PIP joint, though pt is still experiencing moderate pain in this joint with occasional triggering.  Pt plans to follow up with MD next week; 08/18/22: pt is indep with joint protection, though she states triggering has worsened since her cortisone injection, yet her pain is better.  Possibility that pt is using her hand more d/t less pain and OT reminded pt to limit repetition with gripping.   Time 6    Period  Weeks    Status ongoing   Target  Date 09/29/22              OT Long Term Goals -       OT LONG TERM GOAL #1   Title Pt will increase FOTO by 1-2 points to indicate improvement in perceived functional indep with daily tasks.    Baseline Eval: FOTO 60; 06/30/22: 61; 08/18/22: 59   Time 12    Period Weeks    Status ongoing   Target Date 11/10/22     OT LONG TERM GOAL #2   Title Pt will demonstrate increased L FMC/GMC to enable indep with putting hair in ponytail.    Baseline Eval: unable; 06/30/22: pt can hold her hair in a ponytail with BUEs but can not yet manage the rubber band; 08/18/22: pt can tie hair in ponytail   Time 12    Period Weeks    Status Achieved    Target Date 08/10/22      OT LONG TERM GOAL #3   Title Pt will increase L FMC to enable indep with tying shoe laces.    Baseline Eval: unable to tie laces; wears slip on shoes; 06/30/22: pt still wearing slip on shoes; 08/18/22: pt stated she tied her laces the other day, but it took her 4 trials   Time 12    Period Weeks    Status ongoing   Target Date 11/10/22     OT LONG TERM GOAL #4   Title Pt will increase L hand dexterity skills to enable pt to pick up coins from table with L hand.    Baseline Eval: unable; 06/30/22: pt can pick up coins from table with extra time.  Dexterity skills improving as noted by improved 9 hole peg test from 5 min 38 sec to 3 min 44 sec on the L; 08/18/22: Pt picks up coins from table with mild-moderate difficulty, L 9 hole 2 min 50 sec.   Time 12    Period Weeks    Status ongoing   Target Date 11/10/22     OT LONG TERM GOAL #5   Title Pt will increase L grip strength by 5 or more lbs to improve ability to hold and carry ADL supplies in L hand.    Baseline Eval: L grip 17# (R 35#); 06/30/22: L grip 10# (pain in L LF significantly limiting grip as finger continues to trigger; 08/18/22: L grip 23#   Time 12    Period Weeks    Status ongoing   Target Date 11/10/22            Plan -  06/02/22 1036     Clinical Impression Statement Pt had her cortisone injection in the L hand for the L LF triggering.  Pt states her pain is better, but the triggering seems worse since the injection.   Possibility that pt is using her hand more d/t less pain and OT reminded pt to limit repetition with gripping.  Pt also has new medium sized compression glove which she states feels better than the L.  Pt has made improvements with all objective measures, including grip, pinch, and dexterity with 9 hole peg test (see note for details).  Pt states that she is engaging the LUE more with all self care tasks, but the trigger finger and her ataxia throughout the LUE continue to limit efficiency with use of this arm, though she does acknowledge improvement.  Pt will continue to benefit from skilled OT for increasing LUE FMC/GMC skills for  more efficient use of LUE during daily tasks, while working to increase L hand strength as able, considering the L LF triggering.  Pt is also eager to return to working, but wants to continue to improve her mobility and use of LUE.  Plan to request PT eval and tx for gait/balance training.   OT Occupational Profile and History Problem Focused Assessment - Including review of records relating to presenting problem    Occupational performance deficits (Please refer to evaluation for details): ADL's;IADL's;Work    Body Structure / Function / Physical Skills ADL;GMC;UE functional use;FMC;Decreased knowledge of use of DME;Pain;Strength;Coordination;Dexterity;IADL;Sensation;Balance    Rehab Potential Good    Clinical Decision Making Limited treatment options, no task modification necessary    Comorbidities Affecting Occupational Performance: May have comorbidities impacting occupational performance    Modification or Assistance to Complete Evaluation  No modification of tasks or assist necessary to complete eval    OT Frequency 2x / week    OT Duration 12 weeks    OT  Treatment/Interventions Self-care/ADL training;Therapeutic exercise;Therapeutic activities;Cryotherapy;Moist Heat;Neuromuscular education;DME and/or AE instruction;Manual Therapy;Splinting;Patient/family education    Plan OT focus on LUE coordination/strength training and education/splinting for L hand 3rd digit PIP triggering    OT Home Exercise Plan L GMC/FMC tasks, strengthening as tolerated   Consulted and Agree with Plan of Care Patient            Leta Speller, MS, OTR/L   Darleene Cleaver, OT 08/18/2022, 11:14 AM

## 2022-08-18 NOTE — Telephone Encounter (Signed)
Patient is calling to get Alprazolam changed to [facility]. Cancelled at [facility]

## 2022-08-19 ENCOUNTER — Other Ambulatory Visit: Payer: Self-pay

## 2022-08-19 ENCOUNTER — Other Ambulatory Visit: Payer: Self-pay | Admitting: Physical Medicine and Rehabilitation

## 2022-08-19 DIAGNOSIS — F419 Anxiety disorder, unspecified: Secondary | ICD-10-CM

## 2022-08-19 MED ORDER — ALPRAZOLAM 0.5 MG PO TABS
0.5000 mg | ORAL_TABLET | Freq: Two times a day (BID) | ORAL | 3 refills | Status: DC | PRN
  Filled 2022-08-19: qty 180, 90d supply, fill #0
  Filled 2022-11-16: qty 180, 90d supply, fill #1

## 2022-08-19 NOTE — Telephone Encounter (Signed)
Patient informed. 

## 2022-08-20 ENCOUNTER — Telehealth: Payer: Self-pay | Admitting: *Deleted

## 2022-08-20 NOTE — Telephone Encounter (Signed)
Outpatient PT orders signed, faxed to [facility], received confirmation.

## 2022-08-20 NOTE — Telephone Encounter (Signed)
Received fax from [facility] re: patient would benefit from PT. Orders to be signed if NP agrees. Orders placed on NP's desk.

## 2022-08-20 NOTE — Telephone Encounter (Signed)
Order signed and placed in outbox.  Thank you.

## 2022-08-21 ENCOUNTER — Ambulatory Visit: Payer: PPO

## 2022-08-21 DIAGNOSIS — M6281 Muscle weakness (generalized): Secondary | ICD-10-CM

## 2022-08-21 DIAGNOSIS — I6930 Unspecified sequelae of cerebral infarction: Secondary | ICD-10-CM

## 2022-08-21 DIAGNOSIS — R278 Other lack of coordination: Secondary | ICD-10-CM

## 2022-08-21 NOTE — Therapy (Signed)
OUTPATIENT OCCUPATIONAL THERAPY NEURO TREATMENT NOTE    Patient Name: [name] MRN: [id number] DOB:Sep 20, 1956, 66 y.o., female Today's Date: 08/21/2022  PCP: Dr. Birdie Sons REFERRING PROVIDER: Frann Rider, NP   OT End of Session - 08/21/22 2111     Visit Number 19    Number of Visits 41    Date for OT Re-Evaluation 11/10/22    Authorization Time Period Reporting period beginning 06/30/22    OT Start Time 1104    OT Stop Time 1149    OT Time Calculation (min) 44 min    Equipment Utilized During Treatment transport chair, SBQC    Activity Tolerance Patient tolerated treatment well    Behavior During Therapy WFL for tasks assessed/performed               Past Medical History:  Diagnosis Date   Anxiety    Arthritis    CAD (coronary artery disease)    a. 3 vessel CAD noted on coronary CTA 04/2021, s/p CABG x4   Cataract    left   Depression    Diabetes mellitus without complication ([place])    type 2   Environmental and seasonal allergies    GERD (gastroesophageal reflux disease)    Hyperlipidemia    Hypertension    Joint pain    as reported by patient   Papillary fibroelastoma of heart    of the aortic valve,  s/p AVR and tumor resection on 08/04/2021   Right thalamic infarction ([place]) 04/29/2021   S/P AVR (aortic valve replacement)    s/p AVR with resection of AV fibroelastoma on 08/04/2021   S/P CABG x 4    a. s/p CABG x4 LIMA to LAD, SVG to PDA, and Sequential SVG to OM and Diagonal on 8/   Stroke ([place]) 04/2021   admitted at [facility] - left sided weakness   Urinary incontinence    as stated by patient   Past Surgical History:  Procedure Laterality Date   AORTIC VALVE REPLACEMENT N/A 08/04/2021   Procedure: RESECTION AORTIC VALVE TUMOR;  Surgeon: [name], MD;  Location: [place];  Service: Open Heart Surgery;  Laterality: N/A;   APPLICATION OF WOUND VAC N/A 08/24/2021   Procedure: APPLICATION OF WOUND VAC;  Surgeon: Gaye Pollack,  MD;  Location: [place] [facility];  Service: Thoracic;  Laterality: N/A;   APPLICATION OF WOUND VAC N/A 08/28/2021   Procedure: WOUND VAC CHANGE;  Surgeon: [name], MD;  Location: [place];  Service: Thoracic;  Laterality: N/A;   BREAST EXCISIONAL BIOPSY Right 1999   neg   BUBBLE STUDY  04/24/2021   Procedure: BUBBLE STUDY;  Surgeon: Sueanne Margarita, MD;  Location: [place];  Service: Cardiovascular;;   CATARACT EXTRACTION  12/2011   CATARACT EXTRACTION W/PHACO Left 12/02/2016   Procedure: CATARACT EXTRACTION PHACO AND INTRAOCULAR LENS PLACEMENT ([place]);  Surgeon: Estill Cotta, MD;  Location: [facility];  Service: Ophthalmology;  Laterality: Left;  [place] 2:14 AP% 28.4 CDE 59.73 Fluid pack lot # [id number]   COLONOSCOPY WITH PROPOFOL N/A 04/13/2022   Procedure: COLONOSCOPY WITH PROPOFOL;  Surgeon: Lin Landsman, MD;  Location: [facility] ENDOSCOPY;  Service: Gastroenterology;  Laterality: N/A;   CORONARY ARTERY BYPASS GRAFT N/A 08/04/2021   Procedure: CORONARY ARTERY BYPASS GRAFTING (CABG) X  4  USING LEFT INTERNAL MAMMARY ARTERY AND RIGHT GREATER SAPHENOUS VEIN ENDOSCOPIC CONDUITS;  Surgeon: [name], MD;  Location: [place];  Service: Open Heart Surgery;  Laterality: N/A;  DIAGNOSTIC LAPAROSCOPY     EYE SURGERY     IR CT HEAD LTD  04/19/2021   IR PERCUTANEOUS ART THROMBECTOMY/INFUSION INTRACRANIAL INC DIAG ANGIO  04/19/2021       IR PERCUTANEOUS ART THROMBECTOMY/INFUSION INTRACRANIAL INC DIAG ANGIO  04/19/2021   IR US GUIDE VASC ACCESS LEFT  04/19/2021   RADIOLOGY WITH ANESTHESIA N/A 04/19/2021   Procedure: IR WITH ANESTHESIA;  Surgeon: Luanne Bras, MD;  Location: [place];  Service: Radiology;  Laterality: N/A;   STERNAL WOUND DEBRIDEMENT N/A 08/24/2021   Procedure: STERNAL WOUND DRAINAGE AND DEBRIDEMENT;  Surgeon: Gaye Pollack, MD;  Location: [place] [facility];  Service: Thoracic;  Laterality: N/A;   STERNAL WOUND DEBRIDEMENT N/A 08/28/2021   Procedure: STERNAL WOUND DEBRIDEMENT;   Surgeon: [name], MD;  Location: [place];  Service: Thoracic;  Laterality: N/A;   TEE WITHOUT CARDIOVERSION N/A 04/24/2021   Procedure: TRANSESOPHAGEAL ECHOCARDIOGRAM (TEE);  Surgeon: Sueanne Margarita, MD;  Location: [facility] ENDOSCOPY;  Service: Cardiovascular;  Laterality: N/A;   TEE WITHOUT CARDIOVERSION N/A 08/04/2021   Procedure: TRANSESOPHAGEAL ECHOCARDIOGRAM (TEE);  Surgeon: [name], MD;  Location: [place];  Service: Open Heart Surgery;  Laterality: N/A;   TOTAL HIP ARTHROPLASTY Right 04/04/2018   Procedure: TOTAL HIP ARTHROPLASTY;  Surgeon: Dereck Leep, MD;  Location: [facility];  Service: Orthopedics;  Laterality: Right;   Patient Active Problem List   Diagnosis Date Noted   Colon cancer screening    Subcutaneous nodule    Acute blood loss anemia    Diabetic peripheral neuropathy ([place])    Debility 08/11/2021   S/P CABG x 4 08/04/2021   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   Hyperkalemia    Chronic bilateral low back pain without sciatica    Leukocytosis    Hyponatremia    Essential hypertension    Slow transit constipation    Hemiparesis affecting left side as late effect of stroke ([place])    Aortic valve mass    History of CVA with residual deficit 04/19/2021   Dyslipidemia, goal LDL below 70 04/18/2021   Hypertensive urgency 04/18/2021   Recurrent major depressive disorder, in partial remission ([place]) 09/20/2020   Status post total replacement of hip 04/04/2018   Primary osteoarthritis of right hip 02/20/2018   Allergic rhinitis 10/28/2015   Acute stress disorder 08/01/2015   Anxiety 08/01/2015   Clinical depression 08/01/2015   Diabetes ([place]) 08/01/2015   Diverticulosis of colon 08/01/2015   Generalized pruritus 08/01/2015   BP (high blood pressure) 08/01/2015   Cannot sleep 08/01/2015   Adiposity 08/01/2015   Detrusor muscle hypertonia 08/01/2015   Hypercholesterolemia without hypertriglyceridemia 08/01/2015   Female stress incontinence  08/01/2015   Avitaminosis D 08/01/2015    ONSET DATE: 04/18/2021  REFERRING DIAG: hemiparesis affecting L side as late effect of CVA; dsymetria  THERAPY DIAG:  Muscle weakness (generalized)  Other lack of coordination  History of CVA with residual deficit  Rationale for Evaluation and Treatment Rehabilitation  PERTINENT HISTORY: Pt s/p R CVA on 04/18/21. Pt was initially recovering very well from a stroke standpoint with mild LUE >LLE numbness and ambulating with a cane. 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 Therapy eval's recommended CIR for decreased functional mobility - d/c to CIR on 8/29. She required incision debridement of sternal wound and application of VAC eventually discontinued on 9/29.  She was discharged home on 9/30 (after a 32 day stay).  PRECAUTIONS: Fall  SUBJECTIVE: Pt reported trigger finger continues to worsen, but that her new compression gloves are helpful and pt will keep her follow up appointment with Dr. Ranell Patrick to check her finger in a couple of weeks.  OT taped finger during session to limit flexion of the L LF PIP joint.  PAIN:  Are you having pain? Yes; L LF, 5/10 pain with gripping activities, 0/10 at rest.  Heat, rest, and splinting help to decrease pain.    OBJECTIVE:    [facility] OT Assessment - 07/01/22 0001       07/01/22 08/18/22          Observation/Other Assessments     Focus on Therapeutic Outcomes (FOTO)  61  59          Coordination     Gross Motor Movements are Fluid and Coordinated No  No (improving)    Fine Motor Movements are Fluid and Coordinated No  No (improving)    Right 9 Hole Peg Test 31 sec  NT    Left 9 Hole Peg Test 3 min 44 sec   improved from 5 min 38 sec 2 min 50 sec (improved from 3 min 44 sec)          Hand Function     Right Hand Grip (lbs) 35  NT    Right Hand  Lateral Pinch 10 lbs  NT    Right Hand 3 Point Pinch 7 lbs  NT    Left Hand Grip (lbs) 10   decline (L LF pain from triggering limiting grip strength) 23#    Left Hand Lateral Pinch 6 lbs  8#    Left 3 point pinch 5 lbs   finger slipping 8#      TODAY'S TREATMENT:  08/21/22 [place]: 10 min To L hand for pain management/muscle relaxation in prep for therapeutic exercises.  Manual Therapy: Soft tissue massage for increasing circulation, reducing pain, and decreasing edema to L LF PIP joint.  Taped same area to reduce triggering during neuro re-ed activities d/t ongoing triggering.   Neuro re-ed: Facilitated L hand dexterity skills working with [place]; pt worked to pick up pegs from non-skid surface on table top and place into peg board which was positioned on an incline to further challenge reaching and coordination with the LUE.  Pt had several dropped pegs.  Provided cues for 2 and 3 point pinch patterns and stabilizing wrist and hand to reduce compensatory forearm rotation when placing pegs.   PATIENT EDUCATION:  Education details: Continue to limit repetitive flexion of L 3rd digit and wear splint with activity to prevent triggering.  Person educated: Patient Education method: Explanation and Verbal cues Education comprehension: verbalized understanding, returned demonstration, and needs further education   HOME EXERCISE PROGRAM FMC/GMC activities for LUE    OT Short Term Goals -        OT SHORT TERM GOAL #1   Title Pt will be indep to perform LUE HEP for coordination and strengthening.    Baseline Eval: not yet initiated; 06/30/22: pt remains consistent with HEP and avoiding repetitive gripping that causes pain in L LF; 08/18/22: pt remains consistent to engage LUE; HEP ongoing as function improves   Time 6    Period Weeks    Status ongoing   Target Date 09/29/22     OT SHORT TERM GOAL #2   Title Pt to be  indep with joint protection and pain management strategies to  limit triggering of L hand 3rd digit PIP.    Baseline Eval: Trigger finger splint recommended, initiated joint protection educ; 06/30/22: pt is consistent to wear her trigger finger splint and avoids repetitive gripping which causes pain to L LF PIP joint, though pt is still experiencing moderate pain in this joint with occasional triggering.  Pt plans to follow up with MD next week; 08/18/22: pt is indep with joint protection, though she states triggering has worsened since her cortisone injection, yet her pain is better.  Possibility that pt is using her hand more d/t less pain and OT reminded pt to limit repetition with gripping.   Time 6    Period Weeks    Status ongoing   Target Date 09/29/22              OT Long Term Goals -       OT LONG TERM GOAL #1   Title Pt will increase FOTO by 1-2 points to indicate improvement in perceived functional indep with daily tasks.    Baseline Eval: FOTO 60; 06/30/22: 61; 08/18/22: 59   Time 12    Period Weeks    Status ongoing   Target Date 11/10/22     OT LONG TERM GOAL #2   Title Pt will demonstrate increased L FMC/GMC to enable indep with putting hair in ponytail.    Baseline Eval: unable; 06/30/22: pt can hold her hair in a ponytail with BUEs but can not yet manage the rubber band; 08/18/22: pt can tie hair in ponytail   Time 12    Period Weeks    Status Achieved    Target Date 08/10/22      OT LONG TERM GOAL #3   Title Pt will increase L FMC to enable indep with tying shoe laces.    Baseline Eval: unable to tie laces; wears slip on shoes; 06/30/22: pt still wearing slip on shoes; 08/18/22: pt stated she tied her laces the other day, but it took her 4 trials   Time 12    Period Weeks    Status ongoing   Target Date 11/10/22     OT LONG TERM GOAL #4   Title Pt will increase L hand dexterity skills to enable pt to pick up coins from table with L hand.    Baseline Eval: unable; 06/30/22: pt can pick up coins from table with extra time.  Dexterity  skills improving as noted by improved 9 hole peg test from 5 min 38 sec to 3 min 44 sec on the L; 08/18/22: Pt picks up coins from table with mild-moderate difficulty, L 9 hole 2 min 50 sec.   Time 12    Period Weeks    Status ongoing   Target Date 11/10/22     OT LONG TERM GOAL #5   Title Pt will increase L grip strength by 5 or more lbs to improve ability to hold and carry ADL supplies in L hand.    Baseline Eval: L grip 17# (R 35#); 06/30/22: L grip 10# (pain in L LF significantly limiting grip as finger continues to trigger; 08/18/22: L grip 23#   Time 12    Period Weeks    Status ongoing   Target Date 11/10/22            Plan - 06/02/22 1036     Clinical Impression Statement Pt reported trigger finger continues to worsen, but that  her new compression gloves are helpful and pt will keep her follow up appointment with Dr. Ranell Patrick to check her finger in a couple of weeks.  OT taped finger during session to limit flexion of the L LF PIP joint.  Pt with good tolerance to paraffin, soft tissue massage, and finger taping this session to limit triggering.  Pt had several dropped pegs when working with [employer] pegs with board on an incline.  Provided cues for 2 and 3 point pinch patterns and stabilizing wrist and hand to reduce compensatory forearm rotation when placing pegs.  PT order approved and scheduled for gait training/balance.  Pt will continue to benefit from skilled OT for pain and edema management in L LF, and increasing LUE strength and coordination skills in order to increase efficiency when engaging LUE into daily tasks.      OT Occupational Profile and History Problem Focused Assessment - Including review of records relating to presenting problem    Occupational performance deficits (Please refer to evaluation for details): ADL's;IADL's;Work    Body Structure / Function / Physical Skills ADL;GMC;UE functional use;FMC;Decreased knowledge of use of  DME;Pain;Strength;Coordination;Dexterity;IADL;Sensation;Balance    Rehab Potential Good    Clinical Decision Making Limited treatment options, no task modification necessary    Comorbidities Affecting Occupational Performance: May have comorbidities impacting occupational performance    Modification or Assistance to Complete Evaluation  No modification of tasks or assist necessary to complete eval    OT Frequency 2x / week    OT Duration 12 weeks    OT Treatment/Interventions Self-care/ADL training;Therapeutic exercise;Therapeutic activities;Cryotherapy;Moist Heat;Neuromuscular education;DME and/or AE instruction;Manual Therapy;Splinting;Patient/family education    Plan OT focus on LUE coordination/strength training and education/splinting for L hand 3rd digit PIP triggering    OT Home Exercise Plan L GMC/FMC tasks, strengthening as tolerated   Consulted and Agree with Plan of Care Patient            Leta Speller, MS, OTR/L   Darleene Cleaver, OT 08/21/2022, 9:12 PM

## 2022-08-24 ENCOUNTER — Ambulatory Visit: Payer: PPO

## 2022-08-24 NOTE — Progress Notes (Unsigned)
Cardiology Office Note:    Date:  08/25/2022  ID:  [name], DOB 07/05/1956, MRN [id number]  PCP:  Birdie Sons, MD   [facility] Providers Cardiologist:  Werner Lean, MD     CC:  CAD f/u  History of Present Illness:    [name] is a 66 y.o. female with a hx of recent stroke due to a embolization of a PFE, obstructive CAD but CT-FFR who presents after recovering from the stroke.  Seen virtually 06/30/21. Had 08/04/21 resection, s/p CABG x4 LIMA to LAD, SVG to PDA, and Sequential SVG to OM and Diagonal; then had wound vac.  Did well.  Has been lost to follow up. Sees Rehab and Dr. Caryn Section.   Patient notes that she is doing pretty good .   Still have left arm shakes and is still going to physical therapy.  Was in a wheelchair- > walker with wheels-> cane-> cane only for long distances.  Continues to improve Her goal was to go back to work.    There are no interval hospital/ED visit.    No chest pain or pressure unless some hits her on her scan.  No SOB/DOE and no PND/Orthopnea.  No weight gain or leg swelling.  No palpitations or syncope .  Notes decreased appetite  Ambulatory blood pressure 120/60 in August.   Past Medical History:  Diagnosis Date   Anxiety    Arthritis    CAD (coronary artery disease)    a. 3 vessel CAD noted on coronary CTA 04/2021, s/p CABG x4   Cataract    left   Depression    Diabetes mellitus without complication (HCC)    type 2   Environmental and seasonal allergies    GERD (gastroesophageal reflux disease)    Hyperlipidemia    Hypertension    Joint pain    as reported by patient   Papillary fibroelastoma of heart    of the aortic valve,  s/p AVR and tumor resection on 08/04/2021   Right thalamic infarction ([place]) 04/29/2021   S/P AVR (aortic valve replacement)    s/p AVR with resection of AV fibroelastoma on 08/04/2021   S/P CABG x 4    a. s/p CABG x4 LIMA to LAD, SVG to PDA, and Sequential SVG to OM and Diagonal on 8/    Stroke ([place]) 04/2021   admitted at [facility] - left sided weakness   Urinary incontinence    as stated by patient    Past Surgical History:  Procedure Laterality Date   AORTIC VALVE REPLACEMENT N/A 08/04/2021   Procedure: RESECTION AORTIC VALVE TUMOR;  Surgeon: [name], MD;  Location: [place];  Service: Open Heart Surgery;  Laterality: N/A;   APPLICATION OF WOUND VAC N/A 08/24/2021   Procedure: APPLICATION OF WOUND VAC;  Surgeon: Gaye Pollack, MD;  Location: [place] OR;  Service: Thoracic;  Laterality: N/A;   APPLICATION OF WOUND VAC N/A 08/28/2021   Procedure: WOUND VAC CHANGE;  Surgeon: [name], MD;  Location: [place];  Service: Thoracic;  Laterality: N/A;   BREAST EXCISIONAL BIOPSY Right 1999   neg   BUBBLE STUDY  04/24/2021   Procedure: BUBBLE STUDY;  Surgeon: Sueanne Margarita, MD;  Location: [place];  Service: Cardiovascular;;   CATARACT EXTRACTION  12/2011   CATARACT EXTRACTION W/PHACO Left 12/02/2016   Procedure: CATARACT EXTRACTION PHACO AND INTRAOCULAR LENS PLACEMENT ([place]);  Surgeon: Estill Cotta, MD;  Location: [facility];  Service:  Ophthalmology;  Laterality: Left;  [place] 2:14 AP% 28.4 CDE 59.73 Fluid pack lot # [id number]   COLONOSCOPY WITH PROPOFOL N/A 04/13/2022   Procedure: COLONOSCOPY WITH PROPOFOL;  Surgeon: Lin Landsman, MD;  Location: [facility] ENDOSCOPY;  Service: Gastroenterology;  Laterality: N/A;   CORONARY ARTERY BYPASS GRAFT N/A 08/04/2021   Procedure: CORONARY ARTERY BYPASS GRAFTING (CABG) X  4  USING LEFT INTERNAL MAMMARY ARTERY AND RIGHT GREATER SAPHENOUS VEIN ENDOSCOPIC CONDUITS;  Surgeon: [name], MD;  Location: [place];  Service: Open Heart Surgery;  Laterality: N/A;   DIAGNOSTIC LAPAROSCOPY     EYE SURGERY     IR CT HEAD LTD  04/19/2021   IR PERCUTANEOUS ART THROMBECTOMY/INFUSION INTRACRANIAL INC DIAG ANGIO  04/19/2021       IR PERCUTANEOUS ART THROMBECTOMY/INFUSION INTRACRANIAL INC DIAG ANGIO  04/19/2021   IR  US GUIDE VASC ACCESS LEFT  04/19/2021   RADIOLOGY WITH ANESTHESIA N/A 04/19/2021   Procedure: IR WITH ANESTHESIA;  Surgeon: Luanne Bras, MD;  Location: [place];  Service: Radiology;  Laterality: N/A;   STERNAL WOUND DEBRIDEMENT N/A 08/24/2021   Procedure: STERNAL WOUND DRAINAGE AND DEBRIDEMENT;  Surgeon: Gaye Pollack, MD;  Location: [place] [facility];  Service: Thoracic;  Laterality: N/A;   STERNAL WOUND DEBRIDEMENT N/A 08/28/2021   Procedure: STERNAL WOUND DEBRIDEMENT;  Surgeon: [name], MD;  Location: [place];  Service: Thoracic;  Laterality: N/A;   TEE WITHOUT CARDIOVERSION N/A 04/24/2021   Procedure: TRANSESOPHAGEAL ECHOCARDIOGRAM (TEE);  Surgeon: Sueanne Margarita, MD;  Location[facility];  Service: Cardiovascular;  Laterality: N/A;   TEE WITHOUT CARDIOVERSION N/A 08/04/2021   Procedure: TRANSESOPHAGEAL ECHOCARDIOGRAM (TEE);  Surgeon: [name], MD;  Location: [place];  Service: Open Heart Surgery;  Laterality: N/A;   TOTAL HIP ARTHROPLASTY Right 04/04/2018   Procedure: TOTAL HIP ARTHROPLASTY;  Surgeon: Dereck Leep, MD;  Location: [facility] OR[facility];  Service: Orthopedics;  Laterality: Right;    Current Medications: Current Meds  Medication Sig   acetaminophen (TYLENOL) 325 MG tablet Take 2 tablets (650 mg total) by mouth every 4 (four) hours as needed for mild pain (or temp > 37.5 C (99.5 F)).   ALPRAZolam (XANAX) 0.5 MG tablet Take 1 tablet (0.5 mg total) by mouth 2 (two) times daily as needed for anxiety   aspirin 81 MG EC tablet Take 1 tablet (81 mg total) by mouth daily. Swallow whole.   atorvastatin (LIPITOR) 40 MG tablet Take 1 tablet (40 mg total) by mouth daily.   Blood Glucose Monitoring Suppl (FREESTYLE FREEDOM LITE) w/Device KIT USE AS DIRECTED   gabapentin (NEURONTIN) 300 MG capsule Take 1-2 capsules (300-600 mg total) by mouth at bedtime.   glucose blood (FREESTYLE LITE) test strip Also needs Lancets. Use to check blood sugar up to four times a day for insulin  dependant diabetes   glyBURIDE (DIABETA) 5 MG tablet Take 1 tablet (5 mg total) by mouth daily with breakfast.   latanoprost (XALATAN) 0.005 % ophthalmic solution Place 1 drop into the left eye at bedtime.   losartan (COZAAR) 100 MG tablet Take 1 tablet (100 mg total) by mouth daily.   metFORMIN (GLUCOPHAGE) 500 MG tablet Take 1 tablet (500 mg total) by mouth 2 (two) times daily with a meal.   Multiple Vitamin (MULTIVITAMIN) tablet Take 1 tablet by mouth daily.   sertraline (ZOLOFT) 25 MG tablet Take 1 tablet by mouth at bedtime.   [DISCONTINUED] losartan (COZAAR) 25 MG tablet Take 3 tablets (75 mg total)  by mouth daily.     Allergies:   Jardiance [empagliflozin] and Penicillins   Social History   Socioeconomic History   Marital status: Widowed    Spouse name: Not on file   Number of children: 2   Years of education: Not on file   Highest education level: 12th grade  Occupational History    Employer: [employer]  Tobacco Use   Smoking status: Former    Packs/day: 1.50    Years: 25.00    Total pack years: 37.50    Types: Cigarettes    Quit date: 03/24/2007    Years since quitting: 15.4   Smokeless tobacco: Never  Vaping Use   Vaping Use: Never used  Substance and Sexual Activity   Alcohol use: Not Currently   Drug use: No   Sexual activity: Not Currently    Birth control/protection: Post-menopausal  Other Topics Concern   Not on file  Social History Narrative   Not on file   Social Determinants of Health   Financial Resource Strain: Not on file  Food Insecurity: Not on file  Transportation Needs: Not on file  Physical Activity: Not on file  Stress: Not on file  Social Connections: Not on file    Social: has son and daughter; daugther was her ride to visits  Family History: The patient's family history includes Alzheimer's disease in her paternal grandmother; Aneurysm in her mother; Bone cancer in her brother; CAD in her father; Cancer in her father; Colon cancer  in her father; Dementia in her paternal grandmother; Diabetes in her father; Healthy in her daughter and son; Heart failure in her father; Mental illness in her paternal grandfather. There is no history of Breast cancer.  ROS:   Please see the history of present illness.     EKGs/Labs/Other Studies Reviewed:    EKG 08/25/22: SR with LVH 07/09/21: SR rate 86 Baseline Artifact  Transthoracic Echocardiogram: Date:04/19/21 Results:  1. Left ventricular ejection fraction, by estimation, is 70 to 75%. The  left ventricle has hyperdynamic function. The left ventricle has no  regional wall motion abnormalities. Left ventricular diastolic parameters  are consistent with Grade I diastolic  dysfunction (impaired relaxation). Elevated left atrial pressure.   2. Right ventricular systolic function is normal. The right ventricular  size is normal. Tricuspid regurgitation signal is inadequate for assessing  PA pressure.   3. Left atrial size was mildly dilated.   4. The mitral valve is normal in structure. Mild mitral valve  regurgitation. No evidence of mitral stenosis.   5. The aortic valve is normal in structure. Aortic valve regurgitation is  not visualized. No aortic stenosis is present.   6. The inferior vena cava is normal in size with greater than 50%  respiratory variability, suggesting right atrial pressure of 3 mmHg.   Transesophageal Echocardiogram: Date:04/24/21 Results:  1. Left ventricular ejection fraction, by estimation, is 60 to 65%. The  left ventricle has normal function. The left ventricle has no regional  wall motion abnormalities.   2. Prominent papillary muscle is noted. Right ventricular systolic  function is normal. The right ventricular size is normal.   3. Left atrial size was mildly dilated. No left atrial/left atrial  appendage thrombus was detected.   4. The mitral valve is normal in structure. No evidence of mitral valve  regurgitation. No evidence of mitral  stenosis.   5. The aortic valve is abnormal. Trileaflet aortic valve with large well  defined shimmering highly mobile mass on  the aortic side of the valve. 3D  images obtained to try to delineate the origin of the mass as difficult to  discern whether this is  originating from the valve leaflet or the aortic wall. 3D images  suggestive of origin of mass off the aortic valve cusp and most consistent  with papillary fibroelastoma. There is no significant AI. Aortic valve  regurgitation is not visualized. No aortic  stenosis is present.   6. The inferior vena cava is normal in size with greater than 50%  respiratory variability, suggesting right atrial pressure of 3 mmHg.   7. Agitated saline contrast bubble study was negative, with no evidence  of any interatrial shunt.   Cardiac CT : Date: 04/25/21 Results:   IMPRESSION: 1. Coronary calcium score of 1904. This was 99th percentile for age, sex, and race matched control.   2. Normal coronary origin with right dominance.   3. CAD-RADS 4 Severe stenosis. (70-99% or > 50% left main). CT FFR is recommended and will be sent. Consider symptom-guided anti-ischemic pharmacotherapy as well as risk factor modification per guideline directed care.   4.  Aortic atherosclerosis noted.   5. 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   6. Mild to moderate mitral annular calcification noted.    Recent Labs: 10/21/2021: TSH 1.800 03/10/2022: ALT 11; BUN 17; Creatinine, Ser 0.88; Hemoglobin 15.5; Magnesium 1.6; Platelets 253; Potassium 4.6; Sodium 141  Recent Lipid Panel    Component Value Date/Time   CHOL 206 (H) 10/21/2021 1118   CHOL 205 (H) 09/06/2013 1518   TRIG 149 10/21/2021 1118   TRIG 174 09/06/2013 1518   HDL 46 10/21/2021 1118   HDL 53 09/06/2013 1518   CHOLHDL 4.5 (H) 10/21/2021 1118   CHOLHDL 4.2 04/19/2021 0427   VLDL 44 (H) 04/19/2021 0427    VLDL 35 09/06/2013 1518   LDLCALC 133 (H) 10/21/2021 1118   LDLCALC 117 (H) 09/06/2013 1518    Physical Exam:    VS:  BP (!) 162/90   Pulse 98   Ht 5' 7"  (1.702 m)   Wt 171 lb (77.6 kg)   SpO2 98%   BMI 26.78 kg/m     Wt Readings from Last 3 Encounters:  08/25/22 171 lb (77.6 kg)  04/13/22 161 lb (73 kg)  04/01/22 167 lb (75.8 kg)    Gen: No distress   Neck: No JVD Cardiac: No Rubs or Gallops, No murmur, RRR +2 radial pulses Respiratory: Clear to auscultation bilaterally, normal effort, normal  respiratory rate GI: Soft, nontender, non-distended  MS: No  edema Integument: Skin feels warm Neuro:  At time of evaluation, alert and oriented to person/place/time/situation  Psych: Normal affect, patient feels warm   ASSESSMENT:    1. Papillary fibroelastoma of heart   2. Essential hypertension   3. Diabetes mellitus with coincident hypertension ([place])   4. Coronary artery disease involving native coronary artery of native heart without angina pectoris   5. S/P CABG x 4     PLAN:    Obstructive CAD s/p 4V CABG Aortic atherosclerosis PFE with embolic stroke s/p resection HTN with DM - continue ASA 81 mg - LDL goal < 55; she at this AM, we will check fasting Lipids and ALT in a week - her grandchildren broke her blood pressure cuff: if we have a BP cuff we can offer we will give one to her, if not BP check in  [place] in 1 weeks - will increase her losartan to 100 mg PO daily, BMP in one week - if BP still elevated add norvasc 5 mg - No cardiac reason to keep her from work; given her needing a can will defer full eval to PCP and rehab medicine - discussed dietary interventions: we will attempt to come of diet Mountain Dew and Peach Tea to [employer]  Three to four months with me    Medication Adjustments/Labs and Tests Ordered: Current medicines are reviewed at length with the patient today.  Concerns regarding medicines are outlined above.  Orders Placed This  Encounter  Procedures   Basic metabolic panel   Lipid panel   EKG 12-Lead    Meds ordered this encounter  Medications   losartan (COZAAR) 100 MG tablet    Sig: Take 1 tablet (100 mg total) by mouth daily.    Dispense:  90 tablet    Refill:  3     Patient Instructions  Medication Instructions:  Your physician has recommended you make the following change in your medication:  INCREASE: Losartan to 100 mg by mouth once daily  *If you need a refill on your cardiac medications before your next appointment, please call your pharmacy*   Lab Work: [place] at [employer]: Fasting lipid panel and BMP (nothing to eat or drink 8-12 hours before except water/ black coffee)  If you have labs (blood work) drawn today and your tests are completely normal, you will receive your results only by: [place] (if you have MyChart) OR A paper copy in the mail If you have any lab test that is abnormal or we need to change your treatment, we will call you to review the results.   Testing/Procedures: We have provided you with a BP cuff please monitor your BP and keep a log of readings for Dr. Gasper Sells to review.    Follow-Up: At [facility], you and your health needs are our priority.  As part of our continuing mission to provide you with exceptional heart care, we have created designated Provider Care Teams.  These Care Teams include your primary Cardiologist (physician) and Advanced Practice Providers (APPs -  Physician Assistants and Nurse Practitioners) who all work together to provide you with the care you need, when you need it.   Your next appointment:   3-4 month(s)  The format for your next appointment:   In Person  Provider:   Werner Lean, MD     Important Information About Sugar         Signed, Werner Lean, MD  08/25/2022 10:16 AM    [place]

## 2022-08-25 ENCOUNTER — Ambulatory Visit: Payer: PPO

## 2022-08-25 ENCOUNTER — Encounter: Payer: Self-pay | Admitting: Internal Medicine

## 2022-08-25 ENCOUNTER — Other Ambulatory Visit: Payer: Self-pay

## 2022-08-25 ENCOUNTER — Ambulatory Visit: Payer: PPO | Attending: Internal Medicine | Admitting: Internal Medicine

## 2022-08-25 VITALS — BP 162/90 | HR 98 | Ht 67.0 in | Wt 171.0 lb

## 2022-08-25 DIAGNOSIS — I2510 Atherosclerotic heart disease of native coronary artery without angina pectoris: Secondary | ICD-10-CM

## 2022-08-25 DIAGNOSIS — E785 Hyperlipidemia, unspecified: Secondary | ICD-10-CM | POA: Diagnosis not present

## 2022-08-25 DIAGNOSIS — E119 Type 2 diabetes mellitus without complications: Secondary | ICD-10-CM

## 2022-08-25 DIAGNOSIS — D151 Benign neoplasm of heart: Secondary | ICD-10-CM

## 2022-08-25 DIAGNOSIS — Z951 Presence of aortocoronary bypass graft: Secondary | ICD-10-CM | POA: Diagnosis not present

## 2022-08-25 DIAGNOSIS — I10 Essential (primary) hypertension: Secondary | ICD-10-CM

## 2022-08-25 MED ORDER — LOSARTAN POTASSIUM 100 MG PO TABS
100.0000 mg | ORAL_TABLET | Freq: Every day | ORAL | 3 refills | Status: DC
Start: 2022-08-25 — End: 2023-06-11
  Filled 2022-08-25: qty 90, 90d supply, fill #0

## 2022-08-25 NOTE — Patient Instructions (Signed)
Medication Instructions:  Your physician has recommended you make the following change in your medication:  INCREASE: Losartan to 100 mg by mouth once daily  *If you need a refill on your cardiac medications before your next appointment, please call your pharmacy*   Lab Work: [place] at [employer]: Fasting lipid panel and BMP (nothing to eat or drink 8-12 hours before except water/ black coffee)  If you have labs (blood work) drawn today and your tests are completely normal, you will receive your results only by: [place] (if you have MyChart) OR A paper copy in the mail If you have any lab test that is abnormal or we need to change your treatment, we will call you to review the results.   Testing/Procedures: We have provided you with a BP cuff please monitor your BP and keep a log of readings for Dr. Gasper Sells to review.    Follow-Up: At [facility], you and your health needs are our priority.  As part of our continuing mission to provide you with exceptional heart care, we have created designated Provider Care Teams.  These Care Teams include your primary Cardiologist (physician) and Advanced Practice Providers (APPs -  Physician Assistants and Nurse Practitioners) who all work together to provide you with the care you need, when you need it.   Your next appointment:   3-4 month(s)  The format for your next appointment:   In Person  Provider:   Werner Lean, MD     Important Information About Sugar

## 2022-08-26 ENCOUNTER — Other Ambulatory Visit: Payer: Self-pay

## 2022-08-27 ENCOUNTER — Ambulatory Visit: Payer: PPO

## 2022-08-27 DIAGNOSIS — I6381 Other cerebral infarction due to occlusion or stenosis of small artery: Secondary | ICD-10-CM

## 2022-08-27 DIAGNOSIS — R278 Other lack of coordination: Secondary | ICD-10-CM

## 2022-08-27 DIAGNOSIS — M6281 Muscle weakness (generalized): Secondary | ICD-10-CM

## 2022-08-27 NOTE — Therapy (Signed)
OUTPATIENT OCCUPATIONAL THERAPY NEURO PROGRESS/TREATMENT NOTE Reporting period beginning 06/30/22-08/27/22   Patient Name: [name] MRN: [id number] DOB:11/23/1956, 66 y.o., female Today's Date: 08/27/2022  PCP: Dr. Birdie Sons REFERRING PROVIDER: Frann Rider, NP   OT End of Session - 08/27/22 1106     Visit Number 20    Number of Visits 45    Date for OT Re-Evaluation 11/10/22    Authorization Time Period Reporting period beginning 06/30/22-08/27/22    OT Start Time 42    OT Stop Time 1145    OT Time Calculation (min) 45 min    Equipment Utilized During Treatment [facility]    Activity Tolerance Patient tolerated treatment well    Behavior During Therapy [facility] for tasks assessed/performed               Past Medical History:  Diagnosis Date   Anxiety    Arthritis    CAD (coronary artery disease)    a. 3 vessel CAD noted on coronary CTA 04/2021, s/p CABG x4   Cataract    left   Depression    Diabetes mellitus without complication ([place])    type 2   Environmental and seasonal allergies    GERD (gastroesophageal reflux disease)    Hyperlipidemia    Hypertension    Joint pain    as reported by patient   Papillary fibroelastoma of heart    of the aortic valve,  s/p AVR and tumor resection on 08/04/2021   Right thalamic infarction ([place]) 04/29/2021   S/P AVR (aortic valve replacement)    s/p AVR with resection of AV fibroelastoma on 08/04/2021   S/P CABG x 4    a. s/p CABG x4 LIMA to LAD, SVG to PDA, and Sequential SVG to OM and Diagonal on 8/   Stroke ([place]) 04/2021   admitted at [facility] - left sided weakness   Urinary incontinence    as stated by patient   Past Surgical History:  Procedure Laterality Date   AORTIC VALVE REPLACEMENT N/A 08/04/2021   Procedure: RESECTION AORTIC VALVE TUMOR;  Surgeon: [name], MD;  Location: [place];  Service: Open Heart Surgery;  Laterality: N/A;   APPLICATION OF WOUND VAC N/A 08/24/2021   Procedure: APPLICATION  OF WOUND VAC;  Surgeon: Gaye Pollack, MD;  Location: [place] OR;  Service: Thoracic;  Laterality: N/A;   APPLICATION OF WOUND VAC N/A 08/28/2021   Procedure: WOUND VAC CHANGE;  Surgeon: [name], MD;  Location: [place];  Service: Thoracic;  Laterality: N/A;   BREAST EXCISIONAL BIOPSY Right 1999   neg   BUBBLE STUDY  04/24/2021   Procedure: BUBBLE STUDY;  Surgeon: Sueanne Margarita, MD;  Location: [place];  Service: Cardiovascular;;   CATARACT EXTRACTION  12/2011   CATARACT EXTRACTION W/PHACO Left 12/02/2016   Procedure: CATARACT EXTRACTION PHACO AND INTRAOCULAR LENS PLACEMENT ([place]);  Surgeon: Estill Cotta, MD;  Location: [facility] ORS;  Service: Ophthalmology;  Laterality: Left;  [place] 2:14 AP% 28.4 CDE 59.73 Fluid pack lot # [id number]   COLONOSCOPY WITH PROPOFOL N/A 04/13/2022   Procedure: COLONOSCOPY WITH PROPOFOL;  Surgeon: Lin Landsman, MD;  Location: [facility] ENDOSCOPY;  Service: Gastroenterology;  Laterality: N/A;   CORONARY ARTERY BYPASS GRAFT N/A 08/04/2021   Procedure: CORONARY ARTERY BYPASS GRAFTING (CABG) X  4  USING LEFT INTERNAL MAMMARY ARTERY AND RIGHT GREATER SAPHENOUS VEIN ENDOSCOPIC CONDUITS;  Surgeon: [name], MD;  Location: [place];  Service: Open Heart Surgery;  Laterality:  N/A;   DIAGNOSTIC LAPAROSCOPY     EYE SURGERY     IR CT HEAD LTD  04/19/2021   IR PERCUTANEOUS ART THROMBECTOMY/INFUSION INTRACRANIAL INC DIAG ANGIO  04/19/2021       IR PERCUTANEOUS ART THROMBECTOMY/INFUSION INTRACRANIAL INC DIAG ANGIO  04/19/2021   IR US GUIDE VASC ACCESS LEFT  04/19/2021   RADIOLOGY WITH ANESTHESIA N/A 04/19/2021   Procedure: IR WITH ANESTHESIA;  Surgeon: Luanne Bras, MD;  Location: [place];  Service: Radiology;  Laterality: N/A;   STERNAL WOUND DEBRIDEMENT N/A 08/24/2021   Procedure: STERNAL WOUND DRAINAGE AND DEBRIDEMENT;  Surgeon: Gaye Pollack, MD;  Location: [place] OR;  Service: Thoracic;  Laterality: N/A;   STERNAL WOUND DEBRIDEMENT N/A 08/28/2021    Procedure: STERNAL WOUND DEBRIDEMENT;  Surgeon: [name], MD;  Location: [place];  Service: Thoracic;  Laterality: N/A;   TEE WITHOUT CARDIOVERSION N/A 04/24/2021   Procedure: TRANSESOPHAGEAL ECHOCARDIOGRAM (TEE);  Surgeon: Sueanne Margarita, MD;  Location: [facility] ENDOSCOPY;  Service: Cardiovascular;  Laterality: N/A;   TEE WITHOUT CARDIOVERSION N/A 08/04/2021   Procedure: TRANSESOPHAGEAL ECHOCARDIOGRAM (TEE);  Surgeon: [name], MD;  Location: [place];  Service: Open Heart Surgery;  Laterality: N/A;   TOTAL HIP ARTHROPLASTY Right 04/04/2018   Procedure: TOTAL HIP ARTHROPLASTY;  Surgeon: Dereck Leep, MD;  Location: [facility] ORS;  Service: Orthopedics;  Laterality: Right;   Patient Active Problem List   Diagnosis Date Noted   Colon cancer screening    Subcutaneous nodule    Diabetic peripheral neuropathy ([place])    Debility 08/11/2021   S/P CABG x 4 08/04/2021   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   Hyperkalemia    Chronic bilateral low back pain without sciatica    Leukocytosis    Hyponatremia    Essential hypertension    Slow transit constipation    Hemiparesis affecting left side as late effect of stroke ([place])    History of CVA with residual deficit 04/19/2021   Dyslipidemia, goal LDL below 70 04/18/2021   Recurrent major depressive disorder, in partial remission ([place]) 09/20/2020   Status post total replacement of hip 04/04/2018   Primary osteoarthritis of right hip 02/20/2018   Allergic rhinitis 10/28/2015   Acute stress disorder 08/01/2015   Anxiety 08/01/2015   Clinical depression 08/01/2015   Diabetes mellitus with coincident hypertension ([place]) 08/01/2015   Diverticulosis of colon 08/01/2015   Generalized pruritus 08/01/2015   BP (high blood pressure) 08/01/2015   Cannot sleep 08/01/2015   Adiposity 08/01/2015   Detrusor muscle hypertonia 08/01/2015   Hypercholesterolemia without hypertriglyceridemia 08/01/2015   Female stress incontinence  08/01/2015   Avitaminosis D 08/01/2015    ONSET DATE: 04/18/2021  REFERRING DIAG: hemiparesis affecting L side as late effect of CVA; dsymetria  THERAPY DIAG:  Muscle weakness (generalized)  Other lack of coordination  Right thalamic infarction [facility])  Rationale for Evaluation and Treatment Rehabilitation  PERTINENT HISTORY: Pt s/p R CVA on 04/18/21. Pt was initially recovering very well from a stroke standpoint with mild LUE >LLE numbness and ambulating with a cane. 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 Therapy eval's recommended CIR for decreased functional mobility - d/c to CIR on 8/29. She required incision debridement of sternal wound and application of VAC eventually discontinued on 9/29. She was discharged home on 9/30 (after a 32 day stay).  PRECAUTIONS:  Fall  SUBJECTIVE: Pt arrived today without use of transport chair and had walked down with her SBQC from the lobby.  Pt acknowledged being very proud of herself.  PAIN:  Are you having pain? Yes; L LF, 4/10 pain with gripping activities, 0/10 at rest.  Heat, rest, compression glove, and splinting help to decrease pain.    OBJECTIVE:    [facility] OT Assessment - 07/01/22 0001       07/01/22 08/18/22 08/27/22           Observation/Other Assessments      Focus on Therapeutic Outcomes (FOTO)  61  59 NT           Coordination      Gross Motor Movements are Fluid and Coordinated No  No (improving) No (improving)    Fine Motor Movements are Fluid and Coordinated No  No (improving) No (improving)    Right 9 Hole Peg Test 31 sec  NT NT    Left 9 Hole Peg Test 3 min 44 sec   improved from 5 min 38 sec 2 min 50 sec (improved from 3 min 44 sec) 2 min 9 sec            Hand Function      Right Hand Grip (lbs) 35  NT NT    Right Hand Lateral Pinch 10 lbs  NT NT    Right Hand 3  Point Pinch 7 lbs  NT NT    Left Hand Grip (lbs) 10   decline (L LF pain from triggering limiting grip strength) 23# 25#    Left Hand Lateral Pinch 6 lbs  8# 9#    Left 3 point pinch 5 lbs   finger slipping 8# 6# (finger slipping)      TODAY'S TREATMENT:  08/21/22 [place]: 10 min To L hand for pain management/muscle relaxation in prep for therapeutic exercises.  Therapeutic Exercise: Objective measures taken and goals updated for progress note.    Neuro re-ed: Facilitated L hand dexterity skills working with ball pegs; pt worked to pick up pegs from non-skid surface (towel on table top) and place into peg board which was positioned on an incline to further challenge reaching and coordination with the LUE.  Pt practiced 2 and 3 point pinch patterns to place pegs.  Pt had several dropped pegs but improved with repetition.    PATIENT EDUCATION:  Education details: Continue to limit repetitive flexion of L 3rd digit and wear splint with activity to prevent triggering.  Person educated: Patient Education method: Explanation and Verbal cues Education comprehension: verbalized understanding, returned demonstration, and needs further education   HOME EXERCISE PROGRAM FMC/GMC activities for LUE    OT Short Term Goals -        OT SHORT TERM GOAL #1   Title Pt will be indep to perform LUE HEP for coordination and strengthening.    Baseline Eval: not yet initiated; 06/30/22: pt remains consistent with HEP and avoiding repetitive gripping that causes pain in L LF; 08/18/22: pt remains consistent to engage LUE; HEP ongoing as function improves; 08/27/22: same as 08/18/22   Time 6    Period Weeks    Status ongoing   Target Date 09/29/22     OT SHORT TERM GOAL #2   Title Pt to be indep with joint protection and pain management strategies to limit triggering of L hand 3rd digit PIP.    Baseline Eval: Trigger finger splint recommended, initiated joint protection educ; 06/30/22:  pt is consistent to wear  her trigger finger splint and avoids repetitive gripping which causes pain to L LF PIP joint, though pt is still experiencing moderate pain in this joint with occasional triggering.  Pt plans to follow up with MD next week; 08/18/22: pt is indep with joint protection, though she states triggering has worsened since her cortisone injection, yet her pain is better.  Possibility that pt is using her hand more d/t less pain and OT reminded pt to limit repetition with gripping; 08/27/22: same as 08/18/22   Time 6    Period Weeks    Status ongoing   Target Date 09/29/22              OT Long Term Goals -       OT LONG TERM GOAL #1   Title Pt will increase FOTO by 1-2 points to indicate improvement in perceived functional indep with daily tasks.    Baseline Eval: FOTO 60; 06/30/22: 61; 08/18/22: 59; 08/27/22: NT this date   Time 12    Period Weeks    Status ongoing   Target Date 11/10/22     OT LONG TERM GOAL #2   Title Pt will demonstrate increased L FMC/GMC to enable indep with putting hair in ponytail.    Baseline Eval: unable; 06/30/22: pt can hold her hair in a ponytail with BUEs but can not yet manage the rubber band; 08/18/22: pt can tie hair in ponytail   Time 12    Period Weeks    Status Achieved    Target Date 08/10/22      OT LONG TERM GOAL #3   Title Pt will increase L FMC to enable indep with tying shoe laces.    Baseline Eval: unable to tie laces; wears slip on shoes; 06/30/22: pt still wearing slip on shoes; 08/18/22: pt stated she tied her laces the other day, but it took her 4 trials; 08/27/22: same as 08/18/22   Time 12    Period Weeks    Status ongoing   Target Date 11/10/22     OT LONG TERM GOAL #4   Title Pt will increase L hand dexterity skills to enable pt to pick up coins from table with L hand.    Baseline Eval: unable; 06/30/22: pt can pick up coins from table with extra time.  Dexterity skills improving as noted by improved 9 hole peg test from 5 min 38 sec to 3 min 44 sec on  the L; 08/18/22: Pt picks up coins from table with mild-moderate difficulty, L 9 hole 2 min 50 sec; 08/27/22: L 9 hole 2 min 9 secs   Time 12    Period Weeks    Status ongoing   Target Date 11/10/22     OT LONG TERM GOAL #5   Title Pt will increase L grip strength by 5 or more lbs to improve ability to hold and carry ADL supplies in L hand.    Baseline Eval: L grip 17# (R 35#); 06/30/22: L grip 10# (pain in L LF significantly limiting grip as finger continues to trigger; 08/18/22: L grip 23#; 08/27/22: L grip 25#   Time 12    Period Weeks    Status ongoing   Target Date 11/10/22            Plan - 06/02/22 1036     Clinical Impression Statement Pt continues to make steady gains with L hand grip and dexterity skills, see note for  details.   Pt practiced 2 and 3 point pinch patterns to place ball pegs into pegboard on an inclined wedge.  Pt had several dropped pegs but improved with repetition.  L LF tends to slip with a 3 point pinch pattern but this also improved with repetition.  Pt will continue to benefit from skilled OT for pain and edema management in L LF, and increasing LUE strength and coordination skills in order to increase efficiency when engaging LUE into daily tasks.      OT Occupational Profile and History Problem Focused Assessment - Including review of records relating to presenting problem    Occupational performance deficits (Please refer to evaluation for details): ADL's;IADL's;Work    Body Structure / Function / Physical Skills ADL;GMC;UE functional use;FMC;Decreased knowledge of use of DME;Pain;Strength;Coordination;Dexterity;IADL;Sensation;Balance    Rehab Potential Good    Clinical Decision Making Limited treatment options, no task modification necessary    Comorbidities Affecting Occupational Performance: May have comorbidities impacting occupational performance    Modification or Assistance to Complete Evaluation  No modification of tasks or assist necessary to complete  eval    OT Frequency 2x / week    OT Duration 12 weeks    OT Treatment/Interventions Self-care/ADL training;Therapeutic exercise;Therapeutic activities;Cryotherapy;Moist Heat;Neuromuscular education;DME and/or AE instruction;Manual Therapy;Splinting;Patient/family education    Plan OT focus on LUE coordination/strength training and education/splinting for L hand 3rd digit PIP triggering    OT Home Exercise Plan L GMC/FMC tasks, strengthening as tolerated   Consulted and Agree with Plan of Care Patient            Leta Speller, MS, OTR/L   Darleene Cleaver, OT 08/27/2022, 12:58 PM

## 2022-09-01 ENCOUNTER — Ambulatory Visit: Payer: PPO

## 2022-09-01 DIAGNOSIS — M6281 Muscle weakness (generalized): Secondary | ICD-10-CM

## 2022-09-01 DIAGNOSIS — I6381 Other cerebral infarction due to occlusion or stenosis of small artery: Secondary | ICD-10-CM

## 2022-09-01 DIAGNOSIS — R278 Other lack of coordination: Secondary | ICD-10-CM

## 2022-09-02 NOTE — Therapy (Signed)
OUTPATIENT OCCUPATIONAL THERAPY TREATMENT NOTE    Patient Name: [name] MRN: [id number] DOB:10/29/1956, 66 y.o., female Today's Date: 09/02/2022  PCP: Dr. Birdie Sons REFERRING PROVIDER: Frann Rider, NP   OT End of Session - 09/02/22 2108     Visit Number 21    Number of Visits 41    Date for OT Re-Evaluation 11/10/22    Authorization Time Period Reporting period beginning 08/27/22    OT Start Time 1105    OT Stop Time 1145    OT Time Calculation (min) 40 min    Equipment Utilized During Treatment SBQC, transport chair    Activity Tolerance Patient tolerated treatment well    Behavior During Therapy WFL for tasks assessed/performed               Past Medical History:  Diagnosis Date   Anxiety    Arthritis    CAD (coronary artery disease)    a. 3 vessel CAD noted on coronary CTA 04/2021, s/p CABG x4   Cataract    left   Depression    Diabetes mellitus without complication ([place])    type 2   Environmental and seasonal allergies    GERD (gastroesophageal reflux disease)    Hyperlipidemia    Hypertension    Joint pain    as reported by patient   Papillary fibroelastoma of heart    of the aortic valve,  s/p AVR and tumor resection on 08/04/2021   Right thalamic infarction ([place]) 04/29/2021   S/P AVR (aortic valve replacement)    s/p AVR with resection of AV fibroelastoma on 08/04/2021   S/P CABG x 4    a. s/p CABG x4 LIMA to LAD, SVG to PDA, and Sequential SVG to OM and Diagonal on 8/   Stroke ([place]) 04/2021   admitted at [facility] - left sided weakness   Urinary incontinence    as stated by patient   Past Surgical History:  Procedure Laterality Date   AORTIC VALVE REPLACEMENT N/A 08/04/2021   Procedure: RESECTION AORTIC VALVE TUMOR;  Surgeon: [name], MD;  Location: [place];  Service: Open Heart Surgery;  Laterality: N/A;   APPLICATION OF WOUND VAC N/A 08/24/2021   Procedure: APPLICATION OF WOUND VAC;  Surgeon: Gaye Pollack, MD;   Location: [place] OR;  Service: Thoracic;  Laterality: N/A;   APPLICATION OF WOUND VAC N/A 08/28/2021   Procedure: WOUND VAC CHANGE;  Surgeon: [name], MD;  Location: [place];  Service: Thoracic;  Laterality: N/A;   BREAST EXCISIONAL BIOPSY Right 1999   neg   BUBBLE STUDY  04/24/2021   Procedure: BUBBLE STUDY;  Surgeon: Sueanne Margarita, MD;  Location: [place];  Service: Cardiovascular;;   CATARACT EXTRACTION  12/2011   CATARACT EXTRACTION W/PHACO Left 12/02/2016   Procedure: CATARACT EXTRACTION PHACO AND INTRAOCULAR LENS PLACEMENT ([place]);  Surgeon: Estill Cotta, MD;  Location: [facility];  Service: Ophthalmology;  Laterality: Left;  [place] 2:14 AP% 28.4 CDE 59.73 Fluid pack lot # [id number]   COLONOSCOPY WITH PROPOFOL N/A 04/13/2022   Procedure: COLONOSCOPY WITH PROPOFOL;  Surgeon: Lin Landsman, MD;  Location: [facility] ENDOSCOPY;  Service: Gastroenterology;  Laterality: N/A;   CORONARY ARTERY BYPASS GRAFT N/A 08/04/2021   Procedure: CORONARY ARTERY BYPASS GRAFTING (CABG) X  4  USING LEFT INTERNAL MAMMARY ARTERY AND RIGHT GREATER SAPHENOUS VEIN ENDOSCOPIC CONDUITS;  Surgeon: [name], MD;  Location: [place];  Service: Open Heart Surgery;  Laterality: N/A;  DIAGNOSTIC LAPAROSCOPY     EYE SURGERY     IR CT HEAD LTD  04/19/2021   IR PERCUTANEOUS ART THROMBECTOMY/INFUSION INTRACRANIAL INC DIAG ANGIO  04/19/2021       IR PERCUTANEOUS ART THROMBECTOMY/INFUSION INTRACRANIAL INC DIAG ANGIO  04/19/2021   IR US GUIDE VASC ACCESS LEFT  04/19/2021   RADIOLOGY WITH ANESTHESIA N/A 04/19/2021   Procedure: IR WITH ANESTHESIA;  Surgeon: Luanne Bras, MD;  Location: [place];  Service: Radiology;  Laterality: N/A;   STERNAL WOUND DEBRIDEMENT N/A 08/24/2021   Procedure: STERNAL WOUND DRAINAGE AND DEBRIDEMENT;  Surgeon: Gaye Pollack, MD;  Location: [place] OR;  Service: Thoracic;  Laterality: N/A;   STERNAL WOUND DEBRIDEMENT N/A 08/28/2021   Procedure: STERNAL WOUND DEBRIDEMENT;   Surgeon: [name], MD;  Location: [place];  Service: Thoracic;  Laterality: N/A;   TEE WITHOUT CARDIOVERSION N/A 04/24/2021   Procedure: TRANSESOPHAGEAL ECHOCARDIOGRAM (TEE);  Surgeon: Sueanne Margarita, MD;  Location: [facility] ENDOSCOPY;  Service: Cardiovascular;  Laterality: N/A;   TEE WITHOUT CARDIOVERSION N/A 08/04/2021   Procedure: TRANSESOPHAGEAL ECHOCARDIOGRAM (TEE);  Surgeon: [name], MD;  Location: [place];  Service: Open Heart Surgery;  Laterality: N/A;   TOTAL HIP ARTHROPLASTY Right 04/04/2018   Procedure: TOTAL HIP ARTHROPLASTY;  Surgeon: Dereck Leep, MD;  Location: [facility];  Service: Orthopedics;  Laterality: Right;   Patient Active Problem List   Diagnosis Date Noted   Colon cancer screening    Subcutaneous nodule    Diabetic peripheral neuropathy ([place])    Debility 08/11/2021   S/P CABG x 4 08/04/2021   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   Hyperkalemia    Chronic bilateral low back pain without sciatica    Leukocytosis    Hyponatremia    Essential hypertension    Slow transit constipation    Hemiparesis affecting left side as late effect of stroke ([place])    History of CVA with residual deficit 04/19/2021   Dyslipidemia, goal LDL below 70 04/18/2021   Recurrent major depressive disorder, in partial remission ([place]) 09/20/2020   Status post total replacement of hip 04/04/2018   Primary osteoarthritis of right hip 02/20/2018   Allergic rhinitis 10/28/2015   Acute stress disorder 08/01/2015   Anxiety 08/01/2015   Clinical depression 08/01/2015   Diabetes mellitus with coincident hypertension ([place]) 08/01/2015   Diverticulosis of colon 08/01/2015   Generalized pruritus 08/01/2015   BP (high blood pressure) 08/01/2015   Cannot sleep 08/01/2015   Adiposity 08/01/2015   Detrusor muscle hypertonia 08/01/2015   Hypercholesterolemia without hypertriglyceridemia 08/01/2015   Female stress incontinence 08/01/2015   Avitaminosis D 08/01/2015     ONSET DATE: 04/18/2021  REFERRING DIAG: hemiparesis affecting L side as late effect of CVA; dsymetria  THERAPY DIAG:  Muscle weakness (generalized)  Other lack of coordination  Right thalamic infarction [facility])  Rationale for Evaluation and Treatment Rehabilitation  PERTINENT HISTORY: Pt s/p R CVA on 04/18/21. Pt was initially recovering very well from a stroke standpoint with mild LUE >LLE numbness and ambulating with a cane. 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 Therapy eval's recommended CIR for decreased functional mobility - d/c to CIR on 8/29. She required incision debridement of sternal wound and application of VAC eventually discontinued on 9/29. She was discharged home on 9/30 (after a 32 day stay).  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  PAIN:  Are you having pain? Yes; L LF, 4/10 pain with gripping activities, 0/10 at rest.  Heat, rest, compression glove, and splinting help to decrease pain.    OBJECTIVE:    [facility] OT Assessment - 07/01/22 0001       07/01/22 08/18/22 08/27/22           Observation/Other Assessments      Focus on Therapeutic Outcomes (FOTO)  61  59 NT           Coordination      Gross Motor Movements are Fluid and Coordinated No  No (improving) No (improving)    Fine Motor Movements are Fluid and Coordinated No  No (improving) No (improving)    Right 9 Hole Peg Test 31 sec  NT NT    Left 9 Hole Peg Test 3 min 44 sec   improved from 5 min 38 sec 2 min 50 sec (improved from 3 min 44 sec) 2 min 9 sec            Hand Function      Right Hand Grip (lbs) 35  NT NT    Right Hand Lateral Pinch 10 lbs  NT NT    Right Hand 3 Point Pinch 7 lbs  NT  NT    Left Hand Grip (lbs) 10   decline (L LF pain from triggering limiting grip strength) 23# 25#    Left Hand Lateral Pinch 6 lbs  8# 9#    Left 3 point pinch 5 lbs   finger slipping 8# 6# (finger slipping)      TODAY'S TREATMENT:  Paraffin: 10 min To L hand for pain management/muscle relaxation in prep for therapeutic exercises.  Manual Therapy: Soft tissue massage for increasing circulation, reducing pain, and decreasing edema to L LF PIP joint.  Encouraged pt to wear her compression glove as able during the day for reducing swelling in fingers.  Pt agreed.    Neuro re-ed: Facilitated L hand dexterity skills working with [employer] and board on an incline.  Pt practiced 2 and 3 point pinch patterns moving pegs from table to board, to neighboring rows on pegboard.  Cued pt to practice in hand manipulation skills and limiting use of R hand to reposition peg in L hand in prep for placement into board; extra time to perform and multiple dropped pegs.  PATIENT EDUCATION:  Education details: Continue to limit repetitive flexion of L 3rd digit and wear splint with activity to prevent triggering.  Person educated: Patient Education method: Explanation and Verbal cues Education comprehension: verbalized understanding, returned demonstration, and needs further education   HOME EXERCISE PROGRAM FMC/GMC activities for LUE    OT Short Term Goals -        OT SHORT TERM GOAL #1   Title Pt will be indep to perform LUE HEP for coordination and strengthening.    Baseline Eval: not yet initiated; 06/30/22: pt remains consistent with HEP and avoiding repetitive gripping that causes pain in L LF; 08/18/22: pt remains consistent to engage LUE; HEP ongoing as function improves; 08/27/22: same as 08/18/22   Time 6    Period Weeks    Status ongoing   Target Date 09/29/22     OT SHORT TERM GOAL #2   Title Pt to be indep with joint  protection and pain management strategies to limit triggering of L hand 3rd  digit PIP.    Baseline Eval: Trigger finger splint recommended, initiated joint protection educ; 06/30/22: pt is consistent to wear her trigger finger splint and avoids repetitive gripping which causes pain to L LF PIP joint, though pt is still experiencing moderate pain in this joint with occasional triggering.  Pt plans to follow up with MD next week; 08/18/22: pt is indep with joint protection, though she states triggering has worsened since her cortisone injection, yet her pain is better.  Possibility that pt is using her hand more d/t less pain and OT reminded pt to limit repetition with gripping; 08/27/22: same as 08/18/22   Time 6    Period Weeks    Status ongoing   Target Date 09/29/22              OT Long Term Goals -       OT LONG TERM GOAL #1   Title Pt will increase FOTO by 1-2 points to indicate improvement in perceived functional indep with daily tasks.    Baseline Eval: FOTO 60; 06/30/22: 61; 08/18/22: 59; 08/27/22: NT this date   Time 12    Period Weeks    Status ongoing   Target Date 11/10/22     OT LONG TERM GOAL #2   Title Pt will demonstrate increased L FMC/GMC to enable indep with putting hair in ponytail.    Baseline Eval: unable; 06/30/22: pt can hold her hair in a ponytail with BUEs but can not yet manage the rubber band; 08/18/22: pt can tie hair in ponytail   Time 12    Period Weeks    Status Achieved    Target Date 08/10/22      OT LONG TERM GOAL #3   Title Pt will increase L FMC to enable indep with tying shoe laces.    Baseline Eval: unable to tie laces; wears slip on shoes; 06/30/22: pt still wearing slip on shoes; 08/18/22: pt stated she tied her laces the other day, but it took her 4 trials; 08/27/22: same as 08/18/22   Time 12    Period Weeks    Status ongoing   Target Date 11/10/22     OT LONG TERM GOAL #4   Title Pt will increase L hand dexterity skills to enable pt to pick up coins from table with L hand.    Baseline Eval: unable; 06/30/22: pt can pick up  coins from table with extra time.  Dexterity skills improving as noted by improved 9 hole peg test from 5 min 38 sec to 3 min 44 sec on the L; 08/18/22: Pt picks up coins from table with mild-moderate difficulty, L 9 hole 2 min 50 sec; 08/27/22: L 9 hole 2 min 9 secs   Time 12    Period Weeks    Status ongoing   Target Date 11/10/22     OT LONG TERM GOAL #5   Title Pt will increase L grip strength by 5 or more lbs to improve ability to hold and carry ADL supplies in L hand.    Baseline Eval: L grip 17# (R 35#); 06/30/22: L grip 10# (pain in L LF significantly limiting grip as finger continues to trigger; 08/18/22: L grip 23#; 08/27/22: L grip 25#   Time 12    Period Weeks    Status ongoing   Target Date 11/10/22  Plan - 06/02/22 1036     Clinical Impression Statement Pt states L LF continues to trigger.  Encouraged pt to wear her compression glove as able during the day for reducing swelling in fingers as she feels this helps with her pain and swelling.  Pt agreed.  Pt continues to make steady gains with L hand dexterity skills.  Pt practiced 2 and 3 point pinch patterns moving pegs from table to board, to neighboring rows on pegboard; pegboard placed on an incline to challenge gross motor skills.  Cued pt to practice in hand manipulation skills and limiting use of R hand to reposition peg in L hand in prep for placement into board; extra time to perform and multiple dropped pegs.  Pt will continue to benefit from skilled OT for pain and edema management in L LF, and increasing LUE strength and coordination skills in order to increase efficiency when engaging LUE into daily tasks.      OT Occupational Profile and History Problem Focused Assessment - Including review of records relating to presenting problem    Occupational performance deficits (Please refer to evaluation for details): ADL's;IADL's;Work    Body Structure / Function / Physical Skills ADL;GMC;UE functional use;FMC;Decreased  knowledge of use of DME;Pain;Strength;Coordination;Dexterity;IADL;Sensation;Balance    Rehab Potential Good    Clinical Decision Making Limited treatment options, no task modification necessary    Comorbidities Affecting Occupational Performance: May have comorbidities impacting occupational performance    Modification or Assistance to Complete Evaluation  No modification of tasks or assist necessary to complete eval    OT Frequency 2x / week    OT Duration 12 weeks    OT Treatment/Interventions Self-care/ADL training;Therapeutic exercise;Therapeutic activities;Cryotherapy;Moist Heat;Neuromuscular education;DME and/or AE instruction;Manual Therapy;Splinting;Patient/family education    Plan OT focus on LUE coordination/strength training and education/splinting for L hand 3rd digit PIP triggering    OT Home Exercise Plan L GMC/FMC tasks, strengthening as tolerated   Consulted and Agree with Plan of Care Patient            Leta Speller, MS, OTR/L   Darleene Cleaver, OT 09/02/2022, 9:10 PM

## 2022-09-03 ENCOUNTER — Ambulatory Visit: Payer: PPO

## 2022-09-03 DIAGNOSIS — M6281 Muscle weakness (generalized): Secondary | ICD-10-CM | POA: Diagnosis not present

## 2022-09-03 DIAGNOSIS — I6381 Other cerebral infarction due to occlusion or stenosis of small artery: Secondary | ICD-10-CM

## 2022-09-03 DIAGNOSIS — R278 Other lack of coordination: Secondary | ICD-10-CM

## 2022-09-06 NOTE — Therapy (Signed)
OUTPATIENT OCCUPATIONAL THERAPY TREATMENT NOTE    Patient Name: [name] MRN: [id number] DOB:1956/04/19, 66 y.o., female Today's Date: 09/06/2022  PCP: Dr. Birdie Sons REFERRING PROVIDER: Frann Rider, NP   OT End of Session - 09/06/22 1635     Visit Number 22    Number of Visits 23    Date for OT Re-Evaluation 11/10/22    Authorization Time Period Reporting period beginning 08/27/22    OT Start Time 15    OT Stop Time 1345    OT Time Calculation (min) 45 min    Equipment Utilized During Treatment SBQC, transport chair    Activity Tolerance Patient tolerated treatment well    Behavior During Therapy WFL for tasks assessed/performed               Past Medical History:  Diagnosis Date   Anxiety    Arthritis    CAD (coronary artery disease)    a. 3 vessel CAD noted on coronary CTA 04/2021, s/p CABG x4   Cataract    left   Depression    Diabetes mellitus without complication ([place])    type 2   Environmental and seasonal allergies    GERD (gastroesophageal reflux disease)    Hyperlipidemia    Hypertension    Joint pain    as reported by patient   Papillary fibroelastoma of heart    of the aortic valve,  s/p AVR and tumor resection on 08/04/2021   Right thalamic infarction ([place]) 04/29/2021   S/P AVR (aortic valve replacement)    s/p AVR with resection of AV fibroelastoma on 08/04/2021   S/P CABG x 4    a. s/p CABG x4 LIMA to LAD, SVG to PDA, and Sequential SVG to OM and Diagonal on 8/   Stroke ([place]) 04/2021   admitted at [facility] - left sided weakness   Urinary incontinence    as stated by patient   Past Surgical History:  Procedure Laterality Date   AORTIC VALVE REPLACEMENT N/A 08/04/2021   Procedure: RESECTION AORTIC VALVE TUMOR;  Surgeon: [name], MD;  Location: [place];  Service: Open Heart Surgery;  Laterality: N/A;   APPLICATION OF WOUND VAC N/A 08/24/2021   Procedure: APPLICATION OF WOUND VAC;  Surgeon: Gaye Pollack, MD;   Location: [place] OR;  Service: Thoracic;  Laterality: N/A;   APPLICATION OF WOUND VAC N/A 08/28/2021   Procedure: WOUND VAC CHANGE;  Surgeon: [name], MD;  Location: [place];  Service: Thoracic;  Laterality: N/A;   BREAST EXCISIONAL BIOPSY Right 1999   neg   BUBBLE STUDY  04/24/2021   Procedure: BUBBLE STUDY;  Surgeon: Sueanne Margarita, MD;  Location: [place];  Service: Cardiovascular;;   CATARACT EXTRACTION  12/2011   CATARACT EXTRACTION W/PHACO Left 12/02/2016   Procedure: CATARACT EXTRACTION PHACO AND INTRAOCULAR LENS PLACEMENT ([place]);  Surgeon: Estill Cotta, MD;  Location: [facility]S;  Service: Ophthalmology;  Laterality: Left;  [place] 2:14 AP% 28.4 CDE 59.73 Fluid pack lot # [id number]   COLONOSCOPY WITH PROPOFOL N/A 04/13/2022   Procedure: COLONOSCOPY WITH PROPOFOL;  Surgeon: Lin Landsman, MD;  Location: [facility] ENDOSCOPY;  Service: Gastroenterology;  Laterality: N/A;   CORONARY ARTERY BYPASS GRAFT N/A 08/04/2021   Procedure: CORONARY ARTERY BYPASS GRAFTING (CABG) X  4  USING LEFT INTERNAL MAMMARY ARTERY AND RIGHT GREATER SAPHENOUS VEIN ENDOSCOPIC CONDUITS;  Surgeon: [name], MD;  Location: [place];  Service: Open Heart Surgery;  Laterality: N/A;  DIAGNOSTIC LAPAROSCOPY     EYE SURGERY     IR CT HEAD LTD  04/19/2021   IR PERCUTANEOUS ART THROMBECTOMY/INFUSION INTRACRANIAL INC DIAG ANGIO  04/19/2021       IR PERCUTANEOUS ART THROMBECTOMY/INFUSION INTRACRANIAL INC DIAG ANGIO  04/19/2021   IR US GUIDE VASC ACCESS LEFT  04/19/2021   RADIOLOGY WITH ANESTHESIA N/A 04/19/2021   Procedure: IR WITH ANESTHESIA;  Surgeon: Luanne Bras, MD;  Location: [place];  Service: Radiology;  Laterality: N/A;   STERNAL WOUND DEBRIDEMENT N/A 08/24/2021   Procedure: STERNAL WOUND DRAINAGE AND DEBRIDEMENT;  Surgeon: Gaye Pollack, MD;  Location: [place] OR;  Service: Thoracic;  Laterality: N/A;   STERNAL WOUND DEBRIDEMENT N/A 08/28/2021   Procedure: STERNAL WOUND DEBRIDEMENT;   Surgeon: [name], MD;  Location: [place];  Service: Thoracic;  Laterality: N/A;   TEE WITHOUT CARDIOVERSION N/A 04/24/2021   Procedure: TRANSESOPHAGEAL ECHOCARDIOGRAM (TEE);  Surgeon: Sueanne Margarita, MD;  Location: [facility] ENDOSCOPY;  Service: Cardiovascular;  Laterality: N/A;   TEE WITHOUT CARDIOVERSION N/A 08/04/2021   Procedure: TRANSESOPHAGEAL ECHOCARDIOGRAM (TEE);  Surgeon: [name], MD;  Location: [place];  Service: Open Heart Surgery;  Laterality: N/A;   TOTAL HIP ARTHROPLASTY Right 04/04/2018   Procedure: TOTAL HIP ARTHROPLASTY;  Surgeon: Dereck Leep, MD;  Location: [facility];  Service: Orthopedics;  Laterality: Right;   Patient Active Problem List   Diagnosis Date Noted   Colon cancer screening    Subcutaneous nodule    Diabetic peripheral neuropathy ([employer])    Debility 08/11/2021   S/P CABG x 4 08/04/2021   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   Hyperkalemia    Chronic bilateral low back pain without sciatica    Leukocytosis    Hyponatremia    Essential hypertension    Slow transit constipation    Hemiparesis affecting left side as late effect of stroke ([place])    History of CVA with residual deficit 04/19/2021   Dyslipidemia, goal LDL below 70 04/18/2021   Recurrent major depressive disorder, in partial remission ([place]) 09/20/2020   Status post total replacement of hip 04/04/2018   Primary osteoarthritis of right hip 02/20/2018   Allergic rhinitis 10/28/2015   Acute stress disorder 08/01/2015   Anxiety 08/01/2015   Clinical depression 08/01/2015   Diabetes mellitus with coincident hypertension ([place]) 08/01/2015   Diverticulosis of colon 08/01/2015   Generalized pruritus 08/01/2015   BP (high blood pressure) 08/01/2015   Cannot sleep 08/01/2015   Adiposity 08/01/2015   Detrusor muscle hypertonia 08/01/2015   Hypercholesterolemia without hypertriglyceridemia 08/01/2015   Female stress incontinence 08/01/2015   Avitaminosis D 08/01/2015     ONSET DATE: 04/18/2021  REFERRING DIAG: hemiparesis affecting L side as late effect of CVA; dsymetria  THERAPY DIAG:  Muscle weakness (generalized)  Other lack of coordination  Right thalamic infarction [facility])  Rationale for Evaluation and Treatment Rehabilitation  PERTINENT HISTORY: Pt s/p R CVA on 04/18/21. Pt was initially recovering very well from a stroke standpoint with mild LUE >LLE numbness and ambulating with a cane. 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 Therapy eval's recommended CIR for decreased functional mobility - d/c to CIR on 8/29. She required incision debridement of sternal wound and application of VAC eventually discontinued on 9/29. She was discharged home on 9/30 (after a 32 day stay).  PRECAUTIONS: Fall  SUBJECTIVE:  Pt reports that her grandson's sx should be on Monday.  Pt is planning to be able to make it to all of her therapy appointments next week.  PAIN:  Are you having pain? Yes; L LF, 4/10 pain with gripping activities, 0/10 at rest.  Heat, rest, compression glove, and splinting help to decrease pain.    OBJECTIVE:    [facility] OT Assessment - 07/01/22 0001       07/01/22 08/18/22 08/27/22           Observation/Other Assessments      Focus on Therapeutic Outcomes (FOTO)  61  59 NT           Coordination      Gross Motor Movements are Fluid and Coordinated No  No (improving) No (improving)    Fine Motor Movements are Fluid and Coordinated No  No (improving) No (improving)    Right 9 Hole Peg Test 31 sec  NT NT    Left 9 Hole Peg Test 3 min 44 sec   improved from 5 min 38 sec 2 min 50 sec (improved from 3 min 44 sec) 2 min 9 sec            Hand Function      Right Hand Grip (lbs) 35  NT NT    Right Hand Lateral Pinch 10 lbs  NT NT    Right Hand 3 Point Pinch 7 lbs  NT NT    Left Hand Grip  (lbs) 10   decline (L LF pain from triggering limiting grip strength) 23# 25#    Left Hand Lateral Pinch 6 lbs  8# 9#    Left 3 point pinch 5 lbs   finger slipping 8# 6# (finger slipping)      TODAY'S TREATMENT:  [place]: 10 min To L hand for pain management/muscle relaxation in prep for therapeutic exercises.  Manual Therapy: Soft tissue massage for increasing circulation, reducing pain, and decreasing edema to L LF PIP joint.  Continued to encourage pt to wear her compression glove as able during the day for reducing swelling in fingers.  Pt agreed.    Neuro re-ed: Facilitated L hand dexterity skills working with [employer] and board on an incline.  Pt practiced 2 and 3 point pinch patterns moving pegs from table to board, to neighboring rows on pegboard.  Cued pt to practice in hand manipulation skills and limiting use of R hand to reposition peg in L hand in prep for placement into board; extra time to perform and multiple dropped pegs.  Further challenged [place] adding a speed component with placing and removing pegs from board.    PATIENT EDUCATION:  Education details: Continue to limit repetitive flexion of L 3rd digit and wear splint with activity to prevent triggering.  Person educated: Patient Education method: Explanation and Verbal cues Education comprehension: verbalized understanding, returned demonstration, and needs further education   HOME EXERCISE PROGRAM FMC/GMC activities for LUE    OT Short Term Goals -        OT SHORT TERM GOAL #1   Title Pt will be indep to perform LUE HEP for coordination and strengthening.    Baseline Eval: not yet initiated; 06/30/22: pt remains consistent with HEP and avoiding repetitive gripping that causes pain in L LF; 08/18/22: pt remains consistent to engage LUE; HEP ongoing as function improves; 08/27/22: same as 08/18/22   Time 6    Period Weeks    Status ongoing   Target  Date 09/29/22     OT SHORT TERM GOAL #2   Title Pt to be indep with  joint protection and pain management strategies to limit triggering of L hand 3rd digit PIP.    Baseline Eval: Trigger finger splint recommended, initiated joint protection educ; 06/30/22: pt is consistent to wear her trigger finger splint and avoids repetitive gripping which causes pain to L LF PIP joint, though pt is still experiencing moderate pain in this joint with occasional triggering.  Pt plans to follow up with MD next week; 08/18/22: pt is indep with joint protection, though she states triggering has worsened since her cortisone injection, yet her pain is better.  Possibility that pt is using her hand more d/t less pain and OT reminded pt to limit repetition with gripping; 08/27/22: same as 08/18/22   Time 6    Period Weeks    Status ongoing   Target Date 09/29/22              OT Long Term Goals -       OT LONG TERM GOAL #1   Title Pt will increase FOTO by 1-2 points to indicate improvement in perceived functional indep with daily tasks.    Baseline Eval: FOTO 60; 06/30/22: 61; 08/18/22: 59; 08/27/22: NT this date   Time 12    Period Weeks    Status ongoing   Target Date 11/10/22     OT LONG TERM GOAL #2   Title Pt will demonstrate increased L FMC/GMC to enable indep with putting hair in ponytail.    Baseline Eval: unable; 06/30/22: pt can hold her hair in a ponytail with BUEs but can not yet manage the rubber band; 08/18/22: pt can tie hair in ponytail   Time 12    Period Weeks    Status Achieved    Target Date 08/10/22      OT LONG TERM GOAL #3   Title Pt will increase L FMC to enable indep with tying shoe laces.    Baseline Eval: unable to tie laces; wears slip on shoes; 06/30/22: pt still wearing slip on shoes; 08/18/22: pt stated she tied her laces the other day, but it took her 4 trials; 08/27/22: same as 08/18/22   Time 12    Period Weeks    Status ongoing   Target Date 11/10/22     OT LONG TERM GOAL #4   Title Pt will increase L hand dexterity skills to enable pt to pick up  coins from table with L hand.    Baseline Eval: unable; 06/30/22: pt can pick up coins from table with extra time.  Dexterity skills improving as noted by improved 9 hole peg test from 5 min 38 sec to 3 min 44 sec on the L; 08/18/22: Pt picks up coins from table with mild-moderate difficulty, L 9 hole 2 min 50 sec; 08/27/22: L 9 hole 2 min 9 secs   Time 12    Period Weeks    Status ongoing   Target Date 11/10/22     OT LONG TERM GOAL #5   Title Pt will increase L grip strength by 5 or more lbs to improve ability to hold and carry ADL supplies in L hand.    Baseline Eval: L grip 17# (R 35#); 06/30/22: L grip 10# (pain in L LF significantly limiting grip as finger continues to trigger; 08/18/22: L grip 23#; 08/27/22: L grip 25#   Time 12    Period Weeks  Status ongoing   Target Date 11/10/22            Plan - 06/02/22 1036     Clinical Impression Statement Pt states L LF continues to trigger; continued to limit any repetitive PIP flexion of L LF during OT session. Pt continues to make steady gains with L hand dexterity skills.  Pt practiced 2 and 3 point pinch patterns moving pegs from table to board, to neighboring rows on pegboard; pegboard placed on an incline to challenge gross motor skills, and further challenged [place] adding a speed component when placing and removing pegs.  Increasing speed causes worsening ataxia, but pt was still able to complete the tasks, but with a few more dropped pegs.  Pt will continue to benefit from skilled OT for pain and edema management in L LF, and increasing LUE strength and coordination skills in order to increase efficiency when engaging LUE into daily tasks.      OT Occupational Profile and History Problem Focused Assessment - Including review of records relating to presenting problem    Occupational performance deficits (Please refer to evaluation for details): ADL's;IADL's;Work    Body Structure / Function / Physical Skills ADL;GMC;UE functional  use;FMC;Decreased knowledge of use of DME;Pain;Strength;Coordination;Dexterity;IADL;Sensation;Balance    Rehab Potential Good    Clinical Decision Making Limited treatment options, no task modification necessary    Comorbidities Affecting Occupational Performance: May have comorbidities impacting occupational performance    Modification or Assistance to Complete Evaluation  No modification of tasks or assist necessary to complete eval    OT Frequency 2x / week    OT Duration 12 weeks    OT Treatment/Interventions Self-care/ADL training;Therapeutic exercise;Therapeutic activities;Cryotherapy;Moist Heat;Neuromuscular education;DME and/or AE instruction;Manual Therapy;Splinting;Patient/family education    Plan OT focus on LUE coordination/strength training and education/splinting for L hand 3rd digit PIP triggering    OT Home Exercise Plan L GMC/FMC tasks, strengthening as tolerated   Consulted and Agree with Plan of Care Patient            Leta Speller, MS, OTR/L   Darleene Cleaver, OT 09/06/2022, 4:39 PM

## 2022-09-08 ENCOUNTER — Ambulatory Visit: Payer: PPO

## 2022-09-08 ENCOUNTER — Ambulatory Visit: Payer: PPO | Admitting: Physical Therapy

## 2022-09-10 ENCOUNTER — Ambulatory Visit: Payer: PPO

## 2022-09-15 ENCOUNTER — Ambulatory Visit: Payer: PPO

## 2022-09-17 ENCOUNTER — Ambulatory Visit: Payer: PPO

## 2022-09-22 ENCOUNTER — Ambulatory Visit: Payer: PPO | Attending: Adult Health

## 2022-09-22 DIAGNOSIS — R2681 Unsteadiness on feet: Secondary | ICD-10-CM | POA: Diagnosis not present

## 2022-09-22 DIAGNOSIS — R262 Difficulty in walking, not elsewhere classified: Secondary | ICD-10-CM | POA: Diagnosis not present

## 2022-09-22 DIAGNOSIS — M6281 Muscle weakness (generalized): Secondary | ICD-10-CM | POA: Diagnosis not present

## 2022-09-22 DIAGNOSIS — R269 Unspecified abnormalities of gait and mobility: Secondary | ICD-10-CM | POA: Diagnosis not present

## 2022-09-22 DIAGNOSIS — I6381 Other cerebral infarction due to occlusion or stenosis of small artery: Secondary | ICD-10-CM | POA: Diagnosis not present

## 2022-09-22 DIAGNOSIS — R278 Other lack of coordination: Secondary | ICD-10-CM | POA: Diagnosis not present

## 2022-09-22 DIAGNOSIS — R2689 Other abnormalities of gait and mobility: Secondary | ICD-10-CM | POA: Diagnosis not present

## 2022-09-23 NOTE — Therapy (Signed)
OUTPATIENT OCCUPATIONAL THERAPY TREATMENT NOTE    Patient Name: [name] MRN: [id number] DOB:1956/02/16, 66 y.o., female Today's Date: 09/23/2022  PCP: Dr. Birdie Sons REFERRING PROVIDER: Frann Rider, NP   OT End of Session - 09/23/22 0801     Visit Number 23    Number of Visits 38    Date for OT Re-Evaluation 11/10/22    Authorization Time Period Reporting period beginning 08/27/22    OT Start Time 56    OT Stop Time 1145    OT Time Calculation (min) 45 min    Equipment Utilized During Treatment [facility]    Activity Tolerance Patient tolerated treatment well    Behavior During Therapy [facility] for tasks assessed/performed               Past Medical History:  Diagnosis Date   Anxiety    Arthritis    CAD (coronary artery disease)    a. 3 vessel CAD noted on coronary CTA 04/2021, s/p CABG x4   Cataract    left   Depression    Diabetes mellitus without complication ([place])    type 2   Environmental and seasonal allergies    GERD (gastroesophageal reflux disease)    Hyperlipidemia    Hypertension    Joint pain    as reported by patient   Papillary fibroelastoma of heart    of the aortic valve,  s/p AVR and tumor resection on 08/04/2021   Right thalamic infarction ([place]) 04/29/2021   S/P AVR (aortic valve replacement)    s/p AVR with resection of AV fibroelastoma on 08/04/2021   S/P CABG x 4    a. s/p CABG x4 LIMA to LAD, SVG to PDA, and Sequential SVG to OM and Diagonal on 8/   Stroke ([place]) 04/2021   admitted at [facility] - left sided weakness   Urinary incontinence    as stated by patient   Past Surgical History:  Procedure Laterality Date   AORTIC VALVE REPLACEMENT N/A 08/04/2021   Procedure: RESECTION AORTIC VALVE TUMOR;  Surgeon: [name], MD;  Location: [place];  Service: Open Heart Surgery;  Laterality: N/A;   APPLICATION OF WOUND VAC N/A 08/24/2021   Procedure: APPLICATION OF WOUND VAC;  Surgeon: Gaye Pollack, MD;  Location: [place] OR;   Service: Thoracic;  Laterality: N/A;   APPLICATION OF WOUND VAC N/A 08/28/2021   Procedure: WOUND VAC CHANGE;  Surgeon: [name], MD;  Location: [place];  Service: Thoracic;  Laterality: N/A;   BREAST EXCISIONAL BIOPSY Right 1999   neg   BUBBLE STUDY  04/24/2021   Procedure: BUBBLE STUDY;  Surgeon: Sueanne Margarita, MD;  Location: [place];  Service: Cardiovascular;;   CATARACT EXTRACTION  12/2011   CATARACT EXTRACTION W/PHACO Left 12/02/2016   Procedure: CATARACT EXTRACTION PHACO AND INTRAOCULAR LENS PLACEMENT ([place]);  Surgeon: Estill Cotta, MD;  Location: [facility]S;  Service: Ophthalmology;  Laterality: Left;  [place] 2:14 AP% 28.4 CDE 59.73 Fluid pack lot # [id number]   COLONOSCOPY WITH PROPOFOL N/A 04/13/2022   Procedure: COLONOSCOPY WITH PROPOFOL;  Surgeon: Lin Landsman, MD;  Location: [facility] ENDOSCOPY;  Service: Gastroenterology;  Laterality: N/A;   CORONARY ARTERY BYPASS GRAFT N/A 08/04/2021   Procedure: CORONARY ARTERY BYPASS GRAFTING (CABG) X  4  USING LEFT INTERNAL MAMMARY ARTERY AND RIGHT GREATER SAPHENOUS VEIN ENDOSCOPIC CONDUITS;  Surgeon: [name], MD;  Location: [place];  Service: Open Heart Surgery;  Laterality: N/A;   DIAGNOSTIC  LAPAROSCOPY     EYE SURGERY     IR CT HEAD LTD  04/19/2021   IR PERCUTANEOUS ART THROMBECTOMY/INFUSION INTRACRANIAL INC DIAG ANGIO  04/19/2021       IR PERCUTANEOUS ART THROMBECTOMY/INFUSION INTRACRANIAL INC DIAG ANGIO  04/19/2021   IR US GUIDE VASC ACCESS LEFT  04/19/2021   RADIOLOGY WITH ANESTHESIA N/A 04/19/2021   Procedure: IR WITH ANESTHESIA;  Surgeon: Luanne Bras, MD;  Location: [place];  Service: Radiology;  Laterality: N/A;   STERNAL WOUND DEBRIDEMENT N/A 08/24/2021   Procedure: STERNAL WOUND DRAINAGE AND DEBRIDEMENT;  Surgeon: Gaye Pollack, MD;  Location: [place] OR;  Service: Thoracic;  Laterality: N/A;   STERNAL WOUND DEBRIDEMENT N/A 08/28/2021   Procedure: STERNAL WOUND DEBRIDEMENT;  Surgeon: [name], MD;  Location: [place];  Service: Thoracic;  Laterality: N/A;   TEE WITHOUT CARDIOVERSION N/A 04/24/2021   Procedure: TRANSESOPHAGEAL ECHOCARDIOGRAM (TEE);  Surgeon: Sueanne Margarita, MD;  Location: [facility] ENDOSCOPY;  Service: Cardiovascular;  Laterality: N/A;   TEE WITHOUT CARDIOVERSION N/A 08/04/2021   Procedure: TRANSESOPHAGEAL ECHOCARDIOGRAM (TEE);  Surgeon: [name], MD;  Location: [place];  Service: Open Heart Surgery;  Laterality: N/A;   TOTAL HIP ARTHROPLASTY Right 04/04/2018   Procedure: TOTAL HIP ARTHROPLASTY;  Surgeon: Dereck Leep, MD;  Location: [facility];  Service: Orthopedics;  Laterality: Right;   Patient Active Problem List   Diagnosis Date Noted   Colon cancer screening    Subcutaneous nodule    Diabetic peripheral neuropathy ([place])    Debility 08/11/2021   S/P CABG x 4 08/04/2021   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   Hyperkalemia    Chronic bilateral low back pain without sciatica    Leukocytosis    Hyponatremia    Essential hypertension    Slow transit constipation    Hemiparesis affecting left side as late effect of stroke ([place])    History of CVA with residual deficit 04/19/2021   Dyslipidemia, goal LDL below 70 04/18/2021   Recurrent major depressive disorder, in partial remission ([place]) 09/20/2020   Status post total replacement of hip 04/04/2018   Primary osteoarthritis of right hip 02/20/2018   Allergic rhinitis 10/28/2015   Acute stress disorder 08/01/2015   Anxiety 08/01/2015   Clinical depression 08/01/2015   Diabetes mellitus with coincident hypertension ([place]) 08/01/2015   Diverticulosis of colon 08/01/2015   Generalized pruritus 08/01/2015   BP (high blood pressure) 08/01/2015   Cannot sleep 08/01/2015   Adiposity 08/01/2015   Detrusor muscle hypertonia 08/01/2015   Hypercholesterolemia without hypertriglyceridemia 08/01/2015   Female stress incontinence 08/01/2015   Avitaminosis D 08/01/2015    ONSET DATE:  04/18/2021  REFERRING DIAG: hemiparesis affecting L side as late effect of CVA; dsymetria  THERAPY DIAG:  Muscle weakness (generalized)  Other lack of coordination  Right thalamic infarction [facility])  Rationale for Evaluation and Treatment Rehabilitation  PERTINENT HISTORY: Pt s/p R CVA on 04/18/21. Pt was initially recovering very well from a stroke standpoint with mild LUE >LLE numbness and ambulating with a cane. 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 Therapy eval's recommended CIR for decreased functional mobility - d/c to CIR on 8/29. She required incision debridement of sternal wound and application of VAC eventually discontinued on 9/29. She was discharged home on 9/30 (after a 32 day stay).  PRECAUTIONS: Fall  SUBJECTIVE: Pt  back today after spending 12 days visiting grandson in the hospital after his surgery.   PAIN:  Are you having pain? no    OBJECTIVE:    [facility] OT Assessment - 07/01/22 0001       07/01/22 08/18/22 08/27/22           Observation/Other Assessments      Focus on Therapeutic Outcomes (FOTO)  61  59 NT           Coordination      Gross Motor Movements are Fluid and Coordinated No  No (improving) No (improving)    Fine Motor Movements are Fluid and Coordinated No  No (improving) No (improving)    Right 9 Hole Peg Test 31 sec  NT NT    Left 9 Hole Peg Test 3 min 44 sec   improved from 5 min 38 sec 2 min 50 sec (improved from 3 min 44 sec) 2 min 9 sec            Hand Function      Right Hand Grip (lbs) 35  NT NT    Right Hand Lateral Pinch 10 lbs  NT NT    Right Hand 3 Point Pinch 7 lbs  NT NT    Left Hand Grip (lbs) 10   decline (L LF pain from triggering limiting grip strength) 23# 25#    Left Hand Lateral Pinch 6 lbs  8# 9#    Left 3 point pinch 5 lbs   finger slipping 8# 6# (finger slipping)       TODAY'S TREATMENT:  Paraffin: 10 min To L hand for muscle relaxation in prep for therapeutic exercises.    Neuro re-ed: Facilitated L hand dexterity skills and [place] working to pick up magnets from table top and placing them at a target on the white board.  Practiced forward and diagonal reaching patterns moving magnets to various targets along the white board.  Pt practiced removing magnets from board and was able to store up to 3 in hand before discarding.  Several dropped magnets with attempts at storage and translatory movements, moving magnets from palm to fingertips, but improved with repetition.  Pt worked with [employer] and board flat on table top (no wedge).  Pt practiced 2 and 3 point pinch patterns moving pegs from table to board "dot up," then rotating pegs upside down to challenge in hand manipulation skills in prep for placement in the row below.  PATIENT EDUCATION:  Education details: Continue to limit repetitive flexion of L 3rd digit and wear splint with activity to prevent triggering.  Person educated: Patient Education method: Explanation and Verbal cues Education comprehension: verbalized understanding, returned demonstration, and needs further education   HOME EXERCISE PROGRAM FMC/GMC activities for LUE    OT Short Term Goals -        OT SHORT TERM GOAL #1   Title Pt will be indep to perform LUE HEP for coordination and strengthening.    Baseline Eval: not yet initiated; 06/30/22: pt remains consistent with HEP and avoiding repetitive gripping that causes pain in L LF; 08/18/22: pt remains consistent to engage LUE; HEP ongoing as function improves; 08/27/22: same as 08/18/22   Time 6    Period Weeks    Status ongoing   Target Date 09/29/22     OT SHORT TERM GOAL #2   Title Pt to be indep with joint protection and pain management strategies to limit triggering of L hand 3rd  digit PIP.    Baseline Eval: Trigger finger splint recommended, initiated joint protection educ;  06/30/22: pt is consistent to wear her trigger finger splint and avoids repetitive gripping which causes pain to L LF PIP joint, though pt is still experiencing moderate pain in this joint with occasional triggering.  Pt plans to follow up with MD next week; 08/18/22: pt is indep with joint protection, though she states triggering has worsened since her cortisone injection, yet her pain is better.  Possibility that pt is using her hand more d/t less pain and OT reminded pt to limit repetition with gripping; 08/27/22: same as 08/18/22   Time 6    Period Weeks    Status ongoing   Target Date 09/29/22              OT Long Term Goals -       OT LONG TERM GOAL #1   Title Pt will increase FOTO by 1-2 points to indicate improvement in perceived functional indep with daily tasks.    Baseline Eval: FOTO 60; 06/30/22: 61; 08/18/22: 59; 08/27/22: NT this date   Time 12    Period Weeks    Status ongoing   Target Date 11/10/22     OT LONG TERM GOAL #2   Title Pt will demonstrate increased L FMC/GMC to enable indep with putting hair in ponytail.    Baseline Eval: unable; 06/30/22: pt can hold her hair in a ponytail with BUEs but can not yet manage the rubber band; 08/18/22: pt can tie hair in ponytail   Time 12    Period Weeks    Status Achieved    Target Date 08/10/22      OT LONG TERM GOAL #3   Title Pt will increase L FMC to enable indep with tying shoe laces.    Baseline Eval: unable to tie laces; wears slip on shoes; 06/30/22: pt still wearing slip on shoes; 08/18/22: pt stated she tied her laces the other day, but it took her 4 trials; 08/27/22: same as 08/18/22   Time 12    Period Weeks    Status ongoing   Target Date 11/10/22     OT LONG TERM GOAL #4   Title Pt will increase L hand dexterity skills to enable pt to pick up coins from table with L hand.    Baseline Eval: unable; 06/30/22: pt can pick up coins from table with extra time.  Dexterity skills improving as noted by improved 9 hole peg test  from 5 min 38 sec to 3 min 44 sec on the L; 08/18/22: Pt picks up coins from table with mild-moderate difficulty, L 9 hole 2 min 50 sec; 08/27/22: L 9 hole 2 min 9 secs   Time 12    Period Weeks    Status ongoing   Target Date 11/10/22     OT LONG TERM GOAL #5   Title Pt will increase L grip strength by 5 or more lbs to improve ability to hold and carry ADL supplies in L hand.    Baseline Eval: L grip 17# (R 35#); 06/30/22: L grip 10# (pain in L LF significantly limiting grip as finger continues to trigger; 08/18/22: L grip 23#; 08/27/22: L grip 25#   Time 12    Period Weeks    Status ongoing   Target Date 11/10/22            Plan - 06/02/22 1036     Clinical Impression Statement  Pt seen today for OT tx for GMC/FMC skills in the LUE.  Pt reports that her L LF has not been triggering lately, likely d/t pt has been resting her hand while visiting her grandson in the hospital over the last 12 days following his surgery.  Pt reported no pain today.  Pt had fewer dropped pegs today when working with [employer] board and pegs, and fewer dropped magnets when moving them from table top to white board.  Pt will continue to benefit from skilled OT for pain and edema management in L LF, and increasing LUE strength and coordination skills in order to increase efficiency when engaging LUE into daily tasks.      OT Occupational Profile and History Problem Focused Assessment - Including review of records relating to presenting problem    Occupational performance deficits (Please refer to evaluation for details): ADL's;IADL's;Work    Body Structure / Function / Physical Skills ADL;GMC;UE functional use;FMC;Decreased knowledge of use of DME;Pain;Strength;Coordination;Dexterity;IADL;Sensation;Balance    Rehab Potential Good    Clinical Decision Making Limited treatment options, no task modification necessary    Comorbidities Affecting Occupational Performance: May have comorbidities impacting occupational performance     Modification or Assistance to Complete Evaluation  No modification of tasks or assist necessary to complete eval    OT Frequency 2x / week    OT Duration 12 weeks    OT Treatment/Interventions Self-care/ADL training;Therapeutic exercise;Therapeutic activities;Cryotherapy;Moist Heat;Neuromuscular education;DME and/or AE instruction;Manual Therapy;Splinting;Patient/family education    Plan OT focus on LUE coordination/strength training and education/splinting for L hand 3rd digit PIP triggering    OT Home Exercise Plan L GMC/FMC tasks, strengthening as tolerated   Consulted and Agree with Plan of Care Patient            Leta Speller, MS, OTR/L   Darleene Cleaver, OT 09/23/2022, 8:03 AM

## 2022-09-24 ENCOUNTER — Ambulatory Visit: Payer: PPO

## 2022-09-29 ENCOUNTER — Ambulatory Visit: Payer: PPO | Admitting: Physical Therapy

## 2022-09-29 ENCOUNTER — Encounter: Payer: Self-pay | Admitting: Physical Therapy

## 2022-09-29 ENCOUNTER — Ambulatory Visit: Payer: PPO

## 2022-09-29 DIAGNOSIS — M6281 Muscle weakness (generalized): Secondary | ICD-10-CM | POA: Diagnosis not present

## 2022-09-29 DIAGNOSIS — R262 Difficulty in walking, not elsewhere classified: Secondary | ICD-10-CM

## 2022-09-29 DIAGNOSIS — R269 Unspecified abnormalities of gait and mobility: Secondary | ICD-10-CM

## 2022-09-29 DIAGNOSIS — I6381 Other cerebral infarction due to occlusion or stenosis of small artery: Secondary | ICD-10-CM

## 2022-09-29 DIAGNOSIS — R2689 Other abnormalities of gait and mobility: Secondary | ICD-10-CM

## 2022-09-29 DIAGNOSIS — R278 Other lack of coordination: Secondary | ICD-10-CM

## 2022-09-29 NOTE — Therapy (Signed)
OUTPATIENT PHYSICAL THERAPY NEURO EVALUATION   Patient Name: [name] MRN: [id number] DOB:June 19, 1956, 66 y.o., female Today's Date: 09/29/2022   PCP: Birdie Sons, MD REFERRING PROVIDER: Frann Rider, NP   PT End of Session - 09/29/22 1321     Visit Number 1    Number of Visits 16    Date for PT Re-Evaluation 11/24/22    Progress Note Due on Visit 10    PT Start Time 1345    PT Stop Time 1430    PT Time Calculation (min) 45 min    Equipment Utilized During Treatment Gait belt    Activity Tolerance Patient tolerated treatment well    Behavior During Therapy WFL for tasks assessed/performed             Past Medical History:  Diagnosis Date   Anxiety    Arthritis    CAD (coronary artery disease)    a. 3 vessel CAD noted on coronary CTA 04/2021, s/p CABG x4   Cataract    left   Depression    Diabetes mellitus without complication ([place])    type 2   Environmental and seasonal allergies    GERD (gastroesophageal reflux disease)    Hyperlipidemia    Hypertension    Joint pain    as reported by patient   Papillary fibroelastoma of heart    of the aortic valve,  s/p AVR and tumor resection on 08/04/2021   Right thalamic infarction ([place]) 04/29/2021   S/P AVR (aortic valve replacement)    s/p AVR with resection of AV fibroelastoma on 08/04/2021   S/P CABG x 4    a. s/p CABG x4 LIMA to LAD, SVG to PDA, and Sequential SVG to OM and Diagonal on 8/   Stroke ([place]) 04/2021   admitted at [facility] - left sided weakness   Urinary incontinence    as stated by patient   Past Surgical History:  Procedure Laterality Date   AORTIC VALVE REPLACEMENT N/A 08/04/2021   Procedure: RESECTION AORTIC VALVE TUMOR;  Surgeon: [name], MD;  Location: [place];  Service: Open Heart Surgery;  Laterality: N/A;   APPLICATION OF WOUND VAC N/A 08/24/2021   Procedure: APPLICATION OF WOUND VAC;  Surgeon: Gaye Pollack, MD;  Location: [place] [facility];  Service: Thoracic;   Laterality: N/A;   APPLICATION OF WOUND VAC N/A 08/28/2021   Procedure: WOUND VAC CHANGE;  Surgeon: [name], MD;  Location: [place];  Service: Thoracic;  Laterality: N/A;   BREAST EXCISIONAL BIOPSY Right 1999   neg   BUBBLE STUDY  04/24/2021   Procedure: BUBBLE STUDY;  Surgeon: Sueanne Margarita, MD;  Location: [place];  Service: Cardiovascular;;   CATARACT EXTRACTION  12/2011   CATARACT EXTRACTION W/PHACO Left 12/02/2016   Procedure: CATARACT EXTRACTION PHACO AND INTRAOCULAR LENS PLACEMENT ([place]);  Surgeon: Estill Cotta, MD;  Location: [facility];  Service: Ophthalmology;  Laterality: Left;  [place] 2:14 AP% 28.4 CDE 59.73 Fluid pack lot # [id number]   COLONOSCOPY WITH PROPOFOL N/A 04/13/2022   Procedure: COLONOSCOPY WITH PROPOFOL;  Surgeon: Lin Landsman, MD;  Location: [facility] ENDOSCOPY;  Service: Gastroenterology;  Laterality: N/A;   CORONARY ARTERY BYPASS GRAFT N/A 08/04/2021   Procedure: CORONARY ARTERY BYPASS GRAFTING (CABG) X  4  USING LEFT INTERNAL MAMMARY ARTERY AND RIGHT GREATER SAPHENOUS VEIN ENDOSCOPIC CONDUITS;  Surgeon: [name], MD;  Location: [place];  Service: Open Heart Surgery;  Laterality: N/A;   DIAGNOSTIC LAPAROSCOPY  EYE SURGERY     IR CT HEAD LTD  04/19/2021   IR PERCUTANEOUS ART THROMBECTOMY/INFUSION INTRACRANIAL INC DIAG ANGIO  04/19/2021       IR PERCUTANEOUS ART THROMBECTOMY/INFUSION INTRACRANIAL INC DIAG ANGIO  04/19/2021   IR US GUIDE VASC ACCESS LEFT  04/19/2021   RADIOLOGY WITH ANESTHESIA N/A 04/19/2021   Procedure: IR WITH ANESTHESIA;  Surgeon: Luanne Bras, MD;  Location: [place];  Service: Radiology;  Laterality: N/A;   STERNAL WOUND DEBRIDEMENT N/A 08/24/2021   Procedure: STERNAL WOUND DRAINAGE AND DEBRIDEMENT;  Surgeon: Gaye Pollack, MD;  Location: [place] [facility];  Service: Thoracic;  Laterality: N/A;   STERNAL WOUND DEBRIDEMENT N/A 08/28/2021   Procedure: STERNAL WOUND DEBRIDEMENT;  Surgeon: [name], MD;   Location: [place];  Service: Thoracic;  Laterality: N/A;   TEE WITHOUT CARDIOVERSION N/A 04/24/2021   Procedure: TRANSESOPHAGEAL ECHOCARDIOGRAM (TEE);  Surgeon: Sueanne Margarita, MD;  Location: [facility] ENDOSCOPY;  Service: Cardiovascular;  Laterality: N/A;   TEE WITHOUT CARDIOVERSION N/A 08/04/2021   Procedure: TRANSESOPHAGEAL ECHOCARDIOGRAM (TEE);  Surgeon: [name], MD;  Location: [place];  Service: Open Heart Surgery;  Laterality: N/A;   TOTAL HIP ARTHROPLASTY Right 04/04/2018   Procedure: TOTAL HIP ARTHROPLASTY;  Surgeon: Dereck Leep, MD;  Location: [facility];  Service: Orthopedics;  Laterality: Right;   Patient Active Problem List   Diagnosis Date Noted   Colon cancer screening    Subcutaneous nodule    Diabetic peripheral neuropathy ([place])    Debility 08/11/2021   S/P CABG x 4 08/04/2021   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   Hyperkalemia    Chronic bilateral low back pain without sciatica    Leukocytosis    Hyponatremia    Essential hypertension    Slow transit constipation    Hemiparesis affecting left side as late effect of stroke ([place])    History of CVA with residual deficit 04/19/2021   Dyslipidemia, goal LDL below 70 04/18/2021   Recurrent major depressive disorder, in partial remission ([place]) 09/20/2020   Status post total replacement of hip 04/04/2018   Primary osteoarthritis of right hip 02/20/2018   Allergic rhinitis 10/28/2015   Acute stress disorder 08/01/2015   Anxiety 08/01/2015   Clinical depression 08/01/2015   Diabetes mellitus with coincident hypertension ([place]) 08/01/2015   Diverticulosis of colon 08/01/2015   Generalized pruritus 08/01/2015   BP (high blood pressure) 08/01/2015   Cannot sleep 08/01/2015   Adiposity 08/01/2015   Detrusor muscle hypertonia 08/01/2015   Hypercholesterolemia without hypertriglyceridemia 08/01/2015   Female stress incontinence 08/01/2015   Avitaminosis D 08/01/2015    ONSET DATE: 04/18/22  REFERRING DIAG:  I63.9 (ICD-10-CM) - CVA (cerebral vascular accident) ([place])  THERAPY DIAG:  Abnormality of gait and mobility  Difficulty in walking, not elsewhere classified  Muscle weakness (generalized)  Other abnormalities of gait and mobility  Rationale for Evaluation and Treatment Rehabilitation  SUBJECTIVE:  SUBJECTIVE STATEMENT: Pt reports she was working at [employer] one day and was having a stroke. Pt was in rehab in [place] for 2 months. Pt has had no falls in the last 6 months. Pt has difficulty with walking in the store and requires a riding cart in the large grocery stores. Pt reports she would like to eventually return to working again.   Pt accompanied by: self  PERTINENT HISTORY: Pt s/p R CVA on 04/18/21.  Pt was initially recovering very well from a stroke standpoint with mild LUE >LLE numbness and ambulating with a cane. 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  Therapy eval's recommended CIR for decreased functional mobility - d/c to CIR on 8/29.  She required incision debridement of sternal wound and application of VAC eventually discontinued on 9/29.  She was discharged home on 9/30 (after a 32 day stay).    PAIN:  Are you having pain? No  PRECAUTIONS: None  WEIGHT BEARING RESTRICTIONS No  FALLS: Has patient fallen in last 6 months? No  LIVING ENVIRONMENT: Lives with: lives alone Lives in: House/apartment Stairs: Yes: External: 4 steps; on right going up Has following equipment at home: Quad cane small base, Walker - 4 wheeled, and shower chair  PLOF: Independent and Independent with gait  PATIENT GOALS Pt wants to be able to walk  better, walk with less support when she goes out, and pt eventual goal of being able to work.   OBJECTIVE: (objective measures completed at initial evaluation unless otherwise dated)   COGNITION: Overall cognitive status: Within functional limits for tasks assessed   SENSATION: Light touch: WFL Proprioception: Impaired  at the R great toe   COORDINATION: WNL LE   EDEMA:  None presents in LE at this time         POSTURE: No Significant postural limitations  LOWER EXTREMITY ROM:   WNL LOWER EXTREMITY MMT:    MMT Right Eval Left Eval  Hip flexion 4+ 4  Hip extension    Hip abduction 5* 5*  Hip adduction 5* 5*  Hip internal rotation    Hip external rotation    Knee flexion 5 4+  Knee extension 5 4+  Ankle dorsiflexion 5 5  Ankle plantarflexion 5* 5*  Ankle inversion    Ankle eversion    (Blank rows = not tested) *= not tested in official/ gravity dependent position   BED MOBILITY:  Not tested but [facility] per repoty   TRANSFERS: Assistive device utilized: Single point cane  Sit to stand: Modified independence Stand to sit: Complete Independence Chair to chair: Modified independence Floor:  Not tested      GAIT: Gait pattern: step through pattern, decreased arm swing- Left, decreased step length- Right, and decreased stride length Distance walked: 660 ft Assistive device utilized: Single point cane Level of assistance: Modified independence Comments: Pt has ataxia with gait, pt with fatigue shows inattention to envoronment hitting cane against wall on multiple occassions. No specific LOB occurred during ambulation.   FUNCTIONAL TESTs:  5 times sit to stand: 20.11 sec 6 minute walk test: 660 feet 10 meter walk test: .61 m/s Berg Balance Scale: Test visit 2  PATIENT SURVEYS:  FOTO 62  TODAY'S TREATMENT:  Eval Only    PATIENT EDUCATION: Education details: POC Person educated: Patient Education method: Explanation Education comprehension:  verbalized understanding  HOME EXERCISE PROGRAM: To establish next visit     GOALS: Goals reviewed with patient? Yes  SHORT TERM GOALS: Target date: 10/27/2022     Patient will be independent in home exercise program to improve strength/mobility for better functional independence with ADLs. Baseline: No HEP currently  Goal status: INITIAL  LONG TERM GOALS: Target date: 12/22/2022  1.  Patient (> 76 years old) will complete five times sit to stand test in < 15 seconds indicating an increased LE strength and improved balance. Baseline: 20.11 sec Goal status: INITIAL  2.  Patient will increase FOTO to 70 to demonstrate statistically significant improvement in mobility and quality of life.  Baseline: 62 Goal status: INITIAL   3.  Patient will increase Berg Balance score by > 6 points to demonstrate decreased fall risk during functional activities. Baseline: Test visit 2  Goal status: INITIAL    4.   Patient will increase 10 meter walk test to >1.60ms as to improve gait speed for better community ambulation and to reduce fall risk. Baseline:.620m Goal status: INITIAL  5.   Patient will increase six minute walk test distance to >1000 for progression to community ambulator and improve gait ability Baseline:  660 feet in 5:25 Goal status: INITIAL    ASSESSMENT:  CLINICAL IMPRESSION: Patient is a [age]ear old female who presents to physical therapy for evaluation following a stroke affecting her left upper and lower extremities primarily.  Patient is receiving occupational therapy services in this clinic and was recently referred for physical therapy.  Patient presents with impairments in lower extremity functional strength as evidenced by 5 times sit to stand test, patient also presents with impairments and ambulatory speed and endurance evidenced by 10 m walk test and 6-minute walk test.  These limitations are impairing patient's ability to return to many of her premorbid  activities including work and efficient yard and housework activities.  Patient will benefit from skilled physical therapy interventions in order to improve her lower extremity strength, balance, mobility, and quality of life.  OBJECTIVE IMPAIRMENTS Abnormal gait, decreased activity tolerance, decreased balance, decreased endurance, decreased mobility, difficulty walking, and impaired perceived functional ability.   ACTIVITY LIMITATIONS lifting, standing, squatting, stairs, and locomotion level  PARTICIPATION LIMITATIONS: cleaning, laundry, and yard work  PERSONAL FACTORS Age and 3+ comorbidities: CAD, Arhritis, HLD, HTN  are also affecting patient's functional outcome.   REHAB POTENTIAL: Good  CLINICAL DECISION MAKING: Stable/uncomplicated  EVALUATION COMPLEXITY: Low  PLAN: PT FREQUENCY: 2x/week  PT DURATION: 12 weeks  PLANNED INTERVENTIONS: Therapeutic exercises, Therapeutic activity, Neuromuscular re-education, Balance training, Gait training, Patient/Family education, Self Care, Joint mobilization, and Stair training  PLAN FOR NEXT SESSION: Berg balance test, home exercise program based on Berg balance results as well as including sit to stand for lower extremity strength   ChParticia LatherPT 09/29/2022, 1:22 PM

## 2022-09-30 NOTE — Therapy (Signed)
OUTPATIENT OCCUPATIONAL THERAPY TREATMENT NOTE    Patient Name: [name] MRN: [id number] DOB:08-24-1956, 66 y.o., female Today's Date: 09/30/2022  PCP: Dr. Birdie Sons REFERRING PROVIDER: Frann Rider, NP   OT End of Session - 09/29/22 1308     Visit Number 24    Number of Visits 70    Date for OT Re-Evaluation 11/10/22    Authorization Time Period Reporting period beginning 08/27/22    OT Start Time 1304    OT Stop Time 1345    OT Time Calculation (min) 41 min               Past Medical History:  Diagnosis Date   Anxiety    Arthritis    CAD (coronary artery disease)    a. 3 vessel CAD noted on coronary CTA 04/2021, s/p CABG x4   Cataract    left   Depression    Diabetes mellitus without complication ([place])    type 2   Environmental and seasonal allergies    GERD (gastroesophageal reflux disease)    Hyperlipidemia    Hypertension    Joint pain    as reported by patient   Papillary fibroelastoma of heart    of the aortic valve,  s/p AVR and tumor resection on 08/04/2021   Right thalamic infarction ([place]) 04/29/2021   S/P AVR (aortic valve replacement)    s/p AVR with resection of AV fibroelastoma on 08/04/2021   S/P CABG x 4    a. s/p CABG x4 LIMA to LAD, SVG to PDA, and Sequential SVG to OM and Diagonal on 8/   Stroke ([place]) 04/2021   admitted at [facility] - left sided weakness   Urinary incontinence    as stated by patient   Past Surgical History:  Procedure Laterality Date   AORTIC VALVE REPLACEMENT N/A 08/04/2021   Procedure: RESECTION AORTIC VALVE TUMOR;  Surgeon: [name], MD;  Location: [place];  Service: Open Heart Surgery;  Laterality: N/A;   APPLICATION OF WOUND VAC N/A 08/24/2021   Procedure: APPLICATION OF WOUND VAC;  Surgeon: Gaye Pollack, MD;  Location: [place] [facility];  Service: Thoracic;  Laterality: N/A;   APPLICATION OF WOUND VAC N/A 08/28/2021   Procedure: WOUND VAC CHANGE;  Surgeon: [name], MD;  Location:  [place];  Service: Thoracic;  Laterality: N/A;   BREAST EXCISIONAL BIOPSY Right 1999   neg   BUBBLE STUDY  04/24/2021   Procedure: BUBBLE STUDY;  Surgeon: Sueanne Margarita, MD;  Location: [place];  Service: Cardiovascular;;   CATARACT EXTRACTION  12/2011   CATARACT EXTRACTION W/PHACO Left 12/02/2016   Procedure: CATARACT EXTRACTION PHACO AND INTRAOCULAR LENS PLACEMENT ([place]);  Surgeon: Estill Cotta, MD;  Location: [facility];  Service: Ophthalmology;  Laterality: Left;  [place] 2:14 AP% 28.4 CDE 59.73 Fluid pack lot # [id number]   COLONOSCOPY WITH PROPOFOL N/A 04/13/2022   Procedure: COLONOSCOPY WITH PROPOFOL;  Surgeon: Lin Landsman, MD;  [facility];  Service: Gastroenterology;  Laterality: N/A;   CORONARY ARTERY BYPASS GRAFT N/A 08/04/2021   Procedure: CORONARY ARTERY BYPASS GRAFTING (CABG) X  4  USING LEFT INTERNAL MAMMARY ARTERY AND RIGHT GREATER SAPHENOUS VEIN ENDOSCOPIC CONDUITS;  Surgeon: [name], MD;  Location: [place];  Service: Open Heart Surgery;  Laterality: N/A;   DIAGNOSTIC LAPAROSCOPY     EYE SURGERY     IR CT HEAD LTD  04/19/2021   IR PERCUTANEOUS ART THROMBECTOMY/INFUSION INTRACRANIAL INC DIAG ANGIO  04/19/2021       IR PERCUTANEOUS ART THROMBECTOMY/INFUSION INTRACRANIAL INC DIAG ANGIO  04/19/2021   IR US GUIDE VASC ACCESS LEFT  04/19/2021   RADIOLOGY WITH ANESTHESIA N/A 04/19/2021   Procedure: IR WITH ANESTHESIA;  Surgeon: Luanne Bras, MD;  Location: [place];  Service: Radiology;  Laterality: N/A;   STERNAL WOUND DEBRIDEMENT N/A 08/24/2021   Procedure: STERNAL WOUND DRAINAGE AND DEBRIDEMENT;  Surgeon: Gaye Pollack, MD;  Location: [place] [facility];  Service: Thoracic;  Laterality: N/A;   STERNAL WOUND DEBRIDEMENT N/A 08/28/2021   Procedure: STERNAL WOUND DEBRIDEMENT;  Surgeon: [name], MD;  Location: [place];  Service: Thoracic;  Laterality: N/A;   TEE WITHOUT CARDIOVERSION N/A 04/24/2021   Procedure: TRANSESOPHAGEAL ECHOCARDIOGRAM  (TEE);  Surgeon: Sueanne Margarita, MD;  Location: [facility] ENDOSCOPY;  Service: Cardiovascular;  Laterality: N/A;   TEE WITHOUT CARDIOVERSION N/A 08/04/2021   Procedure: TRANSESOPHAGEAL ECHOCARDIOGRAM (TEE);  Surgeon: [name], MD;  Location: [place];  Service: Open Heart Surgery;  Laterality: N/A;   TOTAL HIP ARTHROPLASTY Right 04/04/2018   Procedure: TOTAL HIP ARTHROPLASTY;  Surgeon: Dereck Leep, MD;  Location: [facility];  Service: Orthopedics;  Laterality: Right;   Patient Active Problem List   Diagnosis Date Noted   Colon cancer screening    Subcutaneous nodule    Diabetic peripheral neuropathy ([place])    Debility 08/11/2021   S/P CABG x 4 08/04/2021   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   Hyperkalemia    Chronic bilateral low back pain without sciatica    Leukocytosis    Hyponatremia    Essential hypertension    Slow transit constipation    Hemiparesis affecting left side as late effect of stroke ([place])    History of CVA with residual deficit 04/19/2021   Dyslipidemia, goal LDL below 70 04/18/2021   Recurrent major depressive disorder, in partial remission ([place]) 09/20/2020   Status post total replacement of hip 04/04/2018   Primary osteoarthritis of right hip 02/20/2018   Allergic rhinitis 10/28/2015   Acute stress disorder 08/01/2015   Anxiety 08/01/2015   Clinical depression 08/01/2015   Diabetes mellitus with coincident hypertension ([place]) 08/01/2015   Diverticulosis of colon 08/01/2015   Generalized pruritus 08/01/2015   BP (high blood pressure) 08/01/2015   Cannot sleep 08/01/2015   Adiposity 08/01/2015   Detrusor muscle hypertonia 08/01/2015   Hypercholesterolemia without hypertriglyceridemia 08/01/2015   Female stress incontinence 08/01/2015   Avitaminosis D 08/01/2015    ONSET DATE: 04/18/2021  REFERRING DIAG: hemiparesis affecting L side as late effect of CVA; dsymetria  THERAPY DIAG:  Muscle weakness (generalized)  Other lack of  coordination  Right thalamic infarction [facility])  Rationale for Evaluation and Treatment Rehabilitation  PERTINENT HISTORY: Pt s/p R CVA on 04/18/21. Pt was initially recovering very well from a stroke standpoint with mild LUE >LLE numbness and ambulating with a cane. 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 Therapy eval's recommended CIR for decreased functional mobility - d/c to CIR on 8/29. She required incision debridement of sternal wound and application of VAC eventually discontinued on 9/29. She was discharged home on 9/30 (after a 32 day stay).  PRECAUTIONS: Fall  SUBJECTIVE: Pt reports L hand LF has been trigger more since she's been back home using her hand.  PAIN:  Are you having pain? no    OBJECTIVE:  [facility] OT Assessment - 07/01/22 0001       07/01/22 08/18/22 08/27/22           Observation/Other Assessments      Focus on Therapeutic Outcomes (FOTO)  61  59 NT           Coordination      Gross Motor Movements are Fluid and Coordinated No  No (improving) No (improving)    Fine Motor Movements are Fluid and Coordinated No  No (improving) No (improving)    Right 9 Hole Peg Test 31 sec  NT NT    Left 9 Hole Peg Test 3 min 44 sec   improved from 5 min 38 sec 2 min 50 sec (improved from 3 min 44 sec) 2 min 9 sec            Hand Function      Right Hand Grip (lbs) 35  NT NT    Right Hand Lateral Pinch 10 lbs  NT NT    Right Hand 3 Point Pinch 7 lbs  NT NT    Left Hand Grip (lbs) 10   decline (L LF pain from triggering limiting grip strength) 23# 25#    Left Hand Lateral Pinch 6 lbs  8# 9#    Left 3 point pinch 5 lbs   finger slipping 8# 6# (finger slipping)      TODAY'S TREATMENT:  Paraffin: 10 min To L hand for muscle relaxation in prep for therapeutic exercises.  Manual Therapy: Soft tissue massage for increasing  circulation, reducing pain, and decreasing edema to L LF PIP joint.     Issued size 10 Oval 8 splint for L LF PIP.  Educated pt on removing if L finger begins to swell, and check skin after each time splint is removed.  Pt verbalized understanding.  Encouraged pt to wear during the day when she is active in using her hand to prevent triggering.    Neuro re-ed: Facilitated L hand dexterity skills and [place] working to pick up ball pegs from non-skid surface and placing into pegboard at table top level, then progressed to forward reach at shoulder level to place and remove pegs from pegboard.  Practiced picking up pegs, storing multiple in hand, and removing selected colors from palm out of a group of colors to work on Clinical cytogeneticist.     PATIENT EDUCATION:  Education details: Continue to limit repetitive flexion of L 3rd digit and wear splint with activity to prevent triggering.  Person educated: Patient Education method: Explanation and Verbal cues Education comprehension: verbalized understanding, returned demonstration, and needs further education   HOME EXERCISE PROGRAM FMC/GMC activities for LUE    OT Short Term Goals -        OT SHORT TERM GOAL #1   Title Pt will be indep to perform LUE HEP for coordination and strengthening.    Baseline Eval: not yet initiated; 06/30/22: pt remains consistent with HEP and avoiding repetitive gripping that causes pain in L LF; 08/18/22: pt remains consistent to engage LUE; HEP ongoing as function improves; 08/27/22: same as 08/18/22   Time 6    Period Weeks    Status ongoing   Target Date 09/29/22     OT SHORT TERM GOAL #2   Title Pt to be indep with joint protection and pain management strategies to limit triggering of L hand 3rd digit PIP.    Baseline Eval: Trigger finger splint recommended, initiated joint protection educ; 06/30/22: pt  is consistent to wear her trigger finger splint and avoids repetitive gripping which causes pain to L LF PIP joint,  though pt is still experiencing moderate pain in this joint with occasional triggering.  Pt plans to follow up with MD next week; 08/18/22: pt is indep with joint protection, though she states triggering has worsened since her cortisone injection, yet her pain is better.  Possibility that pt is using her hand more d/t less pain and OT reminded pt to limit repetition with gripping; 08/27/22: same as 08/18/22   Time 6    Period Weeks    Status ongoing   Target Date 09/29/22              OT Long Term Goals -       OT LONG TERM GOAL #1   Title Pt will increase FOTO by 1-2 points to indicate improvement in perceived functional indep with daily tasks.    Baseline Eval: FOTO 60; 06/30/22: 61; 08/18/22: 59; 08/27/22: NT this date   Time 12    Period Weeks    Status ongoing   Target Date 11/10/22     OT LONG TERM GOAL #2   Title Pt will demonstrate increased L FMC/GMC to enable indep with putting hair in ponytail.    Baseline Eval: unable; 06/30/22: pt can hold her hair in a ponytail with BUEs but can not yet manage the rubber band; 08/18/22: pt can tie hair in ponytail   Time 12    Period Weeks    Status Achieved    Target Date 08/10/22      OT LONG TERM GOAL #3   Title Pt will increase L FMC to enable indep with tying shoe laces.    Baseline Eval: unable to tie laces; wears slip on shoes; 06/30/22: pt still wearing slip on shoes; 08/18/22: pt stated she tied her laces the other day, but it took her 4 trials; 08/27/22: same as 08/18/22   Time 12    Period Weeks    Status ongoing   Target Date 11/10/22     OT LONG TERM GOAL #4   Title Pt will increase L hand dexterity skills to enable pt to pick up coins from table with L hand.    Baseline Eval: unable; 06/30/22: pt can pick up coins from table with extra time.  Dexterity skills improving as noted by improved 9 hole peg test from 5 min 38 sec to 3 min 44 sec on the L; 08/18/22: Pt picks up coins from table with mild-moderate difficulty, L 9 hole 2 min 50  sec; 08/27/22: L 9 hole 2 min 9 secs   Time 12    Period Weeks    Status ongoing   Target Date 11/10/22     OT LONG TERM GOAL #5   Title Pt will increase L grip strength by 5 or more lbs to improve ability to hold and carry ADL supplies in L hand.    Baseline Eval: L grip 17# (R 35#); 06/30/22: L grip 10# (pain in L LF significantly limiting grip as finger continues to trigger; 08/18/22: L grip 23#; 08/27/22: L grip 25#   Time 12    Period Weeks    Status ongoing   Target Date 11/10/22            Plan - 06/02/22 1036     Clinical Impression Statement Pt seen today for OT tx for GMC/FMC skills in the LUE.  Pt reports that her  L LF has started to trigger again, likely d/t increased use of hand now that pt is back at home and using her hand more (was not triggering when pt stayed all day in the hospital visiting grandson as she was able to rest her hand).  Issued size 10 Oval 8 splint for L LF PIP.  Educated pt on removing if L finger begins to swell, and check skin after each time splint is removed.  Pt verbalized understanding.  Encouraged pt to wear during the day when she is active in using her hand to prevent triggering.  Pt continues to present with moderate ataxia in the LUE, though improved.  Pt will continue to benefit from skilled OT for pain and edema management in L LF, and increasing LUE strength and coordination skills in order to increase efficiency when engaging LUE into daily tasks.      OT Occupational Profile and History Problem Focused Assessment - Including review of records relating to presenting problem    Occupational performance deficits (Please refer to evaluation for details): ADL's;IADL's;Work    Body Structure / Function / Physical Skills ADL;GMC;UE functional use;FMC;Decreased knowledge of use of DME;Pain;Strength;Coordination;Dexterity;IADL;Sensation;Balance    Rehab Potential Good    Clinical Decision Making Limited treatment options, no task modification necessary     Comorbidities Affecting Occupational Performance: May have comorbidities impacting occupational performance    Modification or Assistance to Complete Evaluation  No modification of tasks or assist necessary to complete eval    OT Frequency 2x / week    OT Duration 12 weeks    OT Treatment/Interventions Self-care/ADL training;Therapeutic exercise;Therapeutic activities;Cryotherapy;Moist Heat;Neuromuscular education;DME and/or AE instruction;Manual Therapy;Splinting;Patient/family education    Plan OT focus on LUE coordination/strength training and education/splinting for L hand 3rd digit PIP triggering    OT Home Exercise Plan L GMC/FMC tasks, strengthening as tolerated   Consulted and Agree with Plan of Care Patient            Leta Speller, MS, OTR/L   Darleene Cleaver, OT 09/30/2022, 2:04 PM

## 2022-10-01 ENCOUNTER — Ambulatory Visit: Payer: PPO | Admitting: Adult Health

## 2022-10-01 ENCOUNTER — Ambulatory Visit: Payer: PPO

## 2022-10-02 ENCOUNTER — Ambulatory Visit: Payer: PPO

## 2022-10-02 DIAGNOSIS — R278 Other lack of coordination: Secondary | ICD-10-CM

## 2022-10-02 DIAGNOSIS — M6281 Muscle weakness (generalized): Secondary | ICD-10-CM | POA: Diagnosis not present

## 2022-10-02 DIAGNOSIS — I6381 Other cerebral infarction due to occlusion or stenosis of small artery: Secondary | ICD-10-CM

## 2022-10-02 DIAGNOSIS — R2681 Unsteadiness on feet: Secondary | ICD-10-CM

## 2022-10-02 NOTE — Therapy (Signed)
OUTPATIENT PHYSICAL THERAPY NEURO TREATMENT NOTE   Patient Name: [name] MRN: [id number] DOB:23-Feb-1956, 66 y.o., female Today's Date: 10/02/2022   PCP: Birdie Sons, MD REFERRING PROVIDER: Frann Rider, NP   PT End of Session - 10/02/22 1128     Visit Number 2    Number of Visits 24    Date for PT Re-Evaluation 11/24/22    Progress Note Due on Visit 10    PT Start Time 0956    PT Stop Time 1030    PT Time Calculation (min) 34 min    Equipment Utilized During Treatment Gait belt    Activity Tolerance Patient tolerated treatment well;Patient limited by fatigue    Behavior During Therapy [facility] for tasks assessed/performed              Past Medical History:  Diagnosis Date   Anxiety    Arthritis    CAD (coronary artery disease)    a. 3 vessel CAD noted on coronary CTA 04/2021, s/p CABG x4   Cataract    left   Depression    Diabetes mellitus without complication ([place])    type 2   Environmental and seasonal allergies    GERD (gastroesophageal reflux disease)    Hyperlipidemia    Hypertension    Joint pain    as reported by patient   Papillary fibroelastoma of heart    of the aortic valve,  s/p AVR and tumor resection on 08/04/2021   Right thalamic infarction ([place]) 04/29/2021   S/P AVR (aortic valve replacement)    s/p AVR with resection of AV fibroelastoma on 08/04/2021   S/P CABG x 4    a. s/p CABG x4 LIMA to LAD, SVG to PDA, and Sequential SVG to OM and Diagonal on 8/   Stroke ([place]) 04/2021   admitted at [facility] - left sided weakness   Urinary incontinence    as stated by patient   Past Surgical History:  Procedure Laterality Date   AORTIC VALVE REPLACEMENT N/A 08/04/2021   Procedure: RESECTION AORTIC VALVE TUMOR;  Surgeon: [name], MD;  Location: [place];  Service: Open Heart Surgery;  Laterality: N/A;   APPLICATION OF WOUND VAC N/A 08/24/2021   Procedure: APPLICATION OF WOUND VAC;  Surgeon: Gaye Pollack, MD;  Location: [place]  OR;  Service: Thoracic;  Laterality: N/A;   APPLICATION OF WOUND VAC N/A 08/28/2021   Procedure: WOUND VAC CHANGE;  Surgeon: [name], MD;  Location: [place];  Service: Thoracic;  Laterality: N/A;   BREAST EXCISIONAL BIOPSY Right 1999   neg   BUBBLE STUDY  04/24/2021   Procedure: BUBBLE STUDY;  Surgeon: Sueanne Margarita, MD;  Location: [place];  Service: Cardiovascular;;   CATARACT EXTRACTION  12/2011   CATARACT EXTRACTION W/PHACO Left 12/02/2016   Procedure: CATARACT EXTRACTION PHACO AND INTRAOCULAR LENS PLACEMENT ([place]);  Surgeon: Estill Cotta, MD;  Location: [facility] ORS;  Service: Ophthalmology;  Laterality: Left;  [place] 2:14 AP% 28.4 CDE 59.73 Fluid pack lot # [id number]   COLONOSCOPY WITH PROPOFOL N/A 04/13/2022   Procedure: COLONOSCOPY WITH PROPOFOL;  Surgeon: Lin Landsman, MD;  Location[facility] END[facility];  Service: Gastroenterology;  Laterality: N/A;   CORONARY ARTERY BYPASS GRAFT N/A 08/04/2021   Procedure: CORONARY ARTERY BYPASS GRAFTING (CABG) X  4  USING LEFT INTERNAL MAMMARY ARTERY AND RIGHT GREATER SAPHENOUS VEIN ENDOSCOPIC CONDUITS;  Surgeon: [name], MD;  Location: [place];  Service: Open Heart Surgery;  Laterality: N/A;  DIAGNOSTIC LAPAROSCOPY     EYE SURGERY     IR CT HEAD LTD  04/19/2021   IR PERCUTANEOUS ART THROMBECTOMY/INFUSION INTRACRANIAL INC DIAG ANGIO  04/19/2021       IR PERCUTANEOUS ART THROMBECTOMY/INFUSION INTRACRANIAL INC DIAG ANGIO  04/19/2021   IR US GUIDE VASC ACCESS LEFT  04/19/2021   RADIOLOGY WITH ANESTHESIA N/A 04/19/2021   Procedure: IR WITH ANESTHESIA;  Surgeon: Luanne Bras, MD;  Location: [place];  Service: Radiology;  Laterality: N/A;   STERNAL WOUND DEBRIDEMENT N/A 08/24/2021   Procedure: STERNAL WOUND DRAINAGE AND DEBRIDEMENT;  Surgeon: Gaye Pollack, MD;  Location: [place] OR;  Service: Thoracic;  Laterality: N/A;   STERNAL WOUND DEBRIDEMENT N/A 08/28/2021   Procedure: STERNAL WOUND DEBRIDEMENT;  Surgeon:  [name], MD;  Location: [place];  Service: Thoracic;  Laterality: N/A;   TEE WITHOUT CARDIOVERSION N/A 04/24/2021   Procedure: TRANSESOPHAGEAL ECHOCARDIOGRAM (TEE);  Surgeon: Sueanne Margarita, MD;  Location: [facility] ENDOSCOPY;  Service: Cardiovascular;  Laterality: N/A;   TEE WITHOUT CARDIOVERSION N/A 08/04/2021   Procedure: TRANSESOPHAGEAL ECHOCARDIOGRAM (TEE);  Surgeon: [name], MD;  Location: [place];  Service: Open Heart Surgery;  Laterality: N/A;   TOTAL HIP ARTHROPLASTY Right 04/04/2018   Procedure: TOTAL HIP ARTHROPLASTY;  Surgeon: Dereck Leep, MD;  Location: [facility] ORS;  Service: Orthopedics;  Laterality: Right;   Patient Active Problem List   Diagnosis Date Noted   Colon cancer screening    Subcutaneous nodule    Diabetic peripheral neuropathy ([place])    Debility 08/11/2021   S/P CABG x 4 08/04/2021   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   Hyperkalemia    Chronic bilateral low back pain without sciatica    Leukocytosis    Hyponatremia    Essential hypertension    Slow transit constipation    Hemiparesis affecting left side as late effect of stroke ([place])    History of CVA with residual deficit 04/19/2021   Dyslipidemia, goal LDL below 70 04/18/2021   Recurrent major depressive disorder, in partial remission ([place]) 09/20/2020   Status post total replacement of hip 04/04/2018   Primary osteoarthritis of right hip 02/20/2018   Allergic rhinitis 10/28/2015   Acute stress disorder 08/01/2015   Anxiety 08/01/2015   Clinical depression 08/01/2015   Diabetes mellitus with coincident hypertension ([place]) 08/01/2015   Diverticulosis of colon 08/01/2015   Generalized pruritus 08/01/2015   BP (high blood pressure) 08/01/2015   Cannot sleep 08/01/2015   Adiposity 08/01/2015   Detrusor muscle hypertonia 08/01/2015   Hypercholesterolemia without hypertriglyceridemia 08/01/2015   Female stress incontinence 08/01/2015   Avitaminosis D 08/01/2015    ONSET DATE:  04/18/22  REFERRING DIAG: I63.9 (ICD-10-CM) - CVA (cerebral vascular accident) ([place])  THERAPY DIAG:  Muscle weakness (generalized)  Unsteadiness on feet  Other lack of coordination  Rationale for Evaluation and Treatment Rehabilitation  SUBJECTIVE:  SUBJECTIVE STATEMENT: Pt reports she had some R shoulder blade pain last night. It kept her up and she couldn't sleep, she has taken pain meds for this. Pt reports no stumbles/falls. Pt reports she has been walking 30 minutes or less a day, limited due to fatigue. She reports no other concerns or updates.  Pt accompanied by: self  PERTINENT HISTORY: Pt s/p R CVA on 04/18/21.  Pt was initially recovering very well from a stroke standpoint with mild LUE >LLE numbness and ambulating with a cane. 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  Therapy eval's recommended CIR for decreased functional mobility - d/c to CIR on 8/29.  She required incision debridement of sternal wound and application of VAC eventually discontinued on 9/29.  She was discharged home on 9/30 (after a 32 day stay).  PMH per chart significant for some of the following: anxiety, depression, CAD, DM with coincident HTN, female stress incontinence, s/p hip replacement (right, 2019), chronic B LBP without sciatica, diabetic peripheral  neuropathy.  PAIN:  Are you having pain? No  PRECAUTIONS: None  WEIGHT BEARING RESTRICTIONS No  FALLS: Has patient fallen in last 6 months? No  LIVING ENVIRONMENT: Lives with: lives alone Lives in: House/apartment Stairs: Yes: External: 4 steps; on right going up Has following equipment at home: Quad  cane small base, Walker - 4 wheeled, and shower chair  PLOF: Independent and Independent with gait  PATIENT GOALS Pt wants to be able to walk better, walk with less support when she goes out, and pt eventual goal of being able to work.   OBJECTIVE: (objective measures completed at initial evaluation unless otherwise dated)   COGNITION: Overall cognitive status: Within functional limits for tasks assessed   SENSATION: Light touch: WFL Proprioception: Impaired  at the R great toe   COORDINATION: WNL LE   EDEMA:  None presents in LE at this time         POSTURE: No Significant postural limitations  LOWER EXTREMITY ROM:   WNL LOWER EXTREMITY MMT:    MMT Right Eval Left Eval  Hip flexion 4+ 4  Hip extension    Hip abduction 5* 5*  Hip adduction 5* 5*  Hip internal rotation    Hip external rotation    Knee flexion 5 4+  Knee extension 5 4+  Ankle dorsiflexion 5 5  Ankle plantarflexion 5* 5*  Ankle inversion    Ankle eversion    (Blank rows = not tested) *= not tested in official/ gravity dependent position   BED MOBILITY:  Not tested but [facility] per repoty   TRANSFERS: Assistive device utilized: Single point cane  Sit to stand: Modified independence Stand to sit: Complete Independence Chair to chair: Modified independence Floor:  Not tested      GAIT: Gait pattern: step through pattern, decreased arm swing- Left, decreased step length- Right, and decreased stride length Distance walked: 660 ft Assistive device utilized: Single point cane Level of assistance: Modified independence Comments: Pt has ataxia with gait, pt with fatigue shows inattention to envoronment hitting cane against wall on multiple occassions. No specific LOB occurred during ambulation.   FUNCTIONAL TESTs:  5 times sit to stand: 20.11 sec 6 minute walk test: 660 feet 10 meter walk test: .61 m/s Berg Balance Scale: Test visit 2  PATIENT SURVEYS:  FOTO 62  TODAY'S TREATMENT:  NMR- BERG: 40/56   SLB 2x30 sec each LE with UUE support    TherEx-  STS 3x5. Fatigues quickly, rates medium, requires recovery intervals  LAQ with 1.5# AW donned 2x10 each LE   Seated march with 1.5# AW 1x10 each LE  Issued HEP and instructed pt in HEP   PATIENT EDUCATION: Education details: further assessment indications, HEP, exercise technique Person educated: Patient Education method: Explanation, Demonstration, Tactile cues, Verbal cues, and Handouts Education comprehension: verbalized understanding   HOME EXERCISE PROGRAM: Access Code: [email or web address] URL: [email or web address] Date: 10/02/2022 Prepared by: Ricard Dillon  Exercises - Sit to Stand with Counter Support  - 1 x daily - 5 x weekly - 3 sets - 10 reps - Standing March with Counter Support  - 1 x daily - 7 x weekly - 2 sets - 2 reps - 30 hold    GOALS: Goals reviewed with patient? Yes  SHORT TERM GOALS: Target date: 10/27/2022     Patient will be independent in home exercise program to improve strength/mobility for better functional independence with ADLs. Baseline: No HEP currently  Goal status: INITIAL  LONG TERM GOALS: Target date: 12/25/2022  1.  Patient (> 53 years old) will complete five times sit to stand test in < 15 seconds indicating an increased LE strength and improved balance. Baseline: 20.11 sec Goal status: INITIAL  2.  Patient will increase FOTO to 70 to demonstrate statistically significant improvement in mobility and quality of life.  Baseline: 62 Goal status: INITIAL   3.  Patient will increase Berg Balance score by > 6 points to demonstrate decreased fall risk during functional activities. Baseline: 40/56 Goal status: INITIAL    4.   Patient will increase 10 meter walk test to >1.28ms as to improve gait speed for better community ambulation and to reduce fall risk. Baseline:.652m Goal status: INITIAL  5.   Patient will increase six minute walk test  distance to >1000 for progression to community ambulator and improve gait ability Baseline:  660 feet in 5:25 Goal status: INITIAL    ASSESSMENT:  CLINICAL IMPRESSION:  Further assessment completed on this date. Pt's BERG score indicates impaired balance and increased fall risk. PT issued HEP to address most significant balance deficit and LE strength deficit. Pt generally limited by fatigue throughout appointment. The patient will benefit from skilled physical therapy interventions in order to improve her lower extremity strength, balance, mobility, and quality of life.  OBJECTIVE IMPAIRMENTS Abnormal gait, decreased activity tolerance, decreased balance, decreased endurance, decreased mobility, difficulty walking, and impaired perceived functional ability.   ACTIVITY LIMITATIONS lifting, standing, squatting, stairs, and locomotion level  PARTICIPATION LIMITATIONS: cleaning, laundry, and yard work  PERSONAL FACTORS Age and 3+ comorbidities: CAD, Arhritis, HLD, HTN  are also affecting patient's functional outcome.   REHAB POTENTIAL: Good  CLINICAL DECISION MAKING: Stable/uncomplicated  EVALUATION COMPLEXITY: Low  PLAN: PT FREQUENCY: 2x/week  PT DURATION: 12 weeks  PLANNED INTERVENTIONS: Therapeutic exercises, Therapeutic activity, Neuromuscular re-education, Balance training, Gait training, Patient/Family education, Self Care, Joint mobilization, and Stair training  PLAN FOR NEXT SESSION: balance, endurance, strength and gait   HaZollie PeePT 10/02/2022, 11:36 AM

## 2022-10-04 NOTE — Therapy (Signed)
OUTPATIENT OCCUPATIONAL THERAPY TREATMENT NOTE    Patient Name: [name] MRN: [id number] DOB:[birth year], 66 y.o., female Today's Date: 10/04/2022  PCP: Dr. Birdie Sons REFERRING PROVIDER: Frann Rider, NP   OT End of Session - 10/04/22 1545     Visit Number 25    Number of Visits 61    Date for OT Re-Evaluation 11/10/22    Authorization Time Period Reporting period beginning 08/27/22    OT Start Time 1032    OT Stop Time 1115    OT Time Calculation (min) 43 min    Equipment Utilized During Treatment [facility]    Activity Tolerance Patient tolerated treatment well    Behavior During Therapy [facility] for tasks assessed/performed               Past Medical History:  Diagnosis Date   Anxiety    Arthritis    CAD (coronary artery disease)    a. 3 vessel CAD noted on coronary CTA 04/2021, s/p CABG x4   Cataract    left   Depression    Diabetes mellitus without complication ([place])    type 2   Environmental and seasonal allergies    GERD (gastroesophageal reflux disease)    Hyperlipidemia    Hypertension    Joint pain    as reported by patient   Papillary fibroelastoma of heart    of the aortic valve,  s/p AVR and tumor resection on 08/04/2021   Right thalamic infarction ([place]) 04/29/2021   S/P AVR (aortic valve replacement)    s/p AVR with resection of AV fibroelastoma on 08/04/2021   S/P CABG x 4    a. s/p CABG x4 LIMA to LAD, SVG to PDA, and Sequential SVG to OM and Diagonal on 8/   Stroke ([place]) 04/2021   admitted at [facility] - left sided weakness   Urinary incontinence    as stated by patient   Past Surgical History:  Procedure Laterality Date   AORTIC VALVE REPLACEMENT N/A 08/04/2021   Procedure: RESECTION AORTIC VALVE TUMOR;  Surgeon: [name], MD;  Location: [place];  Service: Open Heart Surgery;  Laterality: N/A;   APPLICATION OF WOUND VAC N/A 08/24/2021   Procedure: APPLICATION OF WOUND VAC;  Surgeon: Gaye Pollack, MD;  Location: [place] OR;   Service: Thoracic;  Laterality: N/A;   APPLICATION OF WOUND VAC N/A 08/28/2021   Procedure: WOUND VAC CHANGE;  Surgeon: [name], MD;  Location: [place];  Service: Thoracic;  Laterality: N/A;   BREAST EXCISIONAL BIOPSY Right 1999   neg   BUBBLE STUDY  04/24/2021   Procedure: BUBBLE STUDY;  Surgeon: Sueanne Margarita, MD;  Location: [place];  Service: Cardiovascular;;   CATARACT EXTRACTION  12/2011   CATARACT EXTRACTION W/PHACO Left 12/02/2016   Procedure: CATARACT EXTRACTION PHACO AND INTRAOCULAR LENS PLACEMENT ([place]);  Surgeon: Estill Cotta, MD;  Location: [facility];  Service: Ophthalmology;  Laterality: Left;  [place] 2:14 AP% 28.4 CDE 59.73 Fluid pack lot # [id number]   COLONOSCOPY WITH PROPOFOL N/A 04/13/2022   Procedure: COLONOSCOPY WITH PROPOFOL;  Surgeon: Lin Landsman, MD;  Location: [facility] ENDOSCOPY;  Service: Gastroenterology;  Laterality: N/A;   CORONARY ARTERY BYPASS GRAFT N/A 08/04/2021   Procedure: CORONARY ARTERY BYPASS GRAFTING (CABG) X  4  USING LEFT INTERNAL MAMMARY ARTERY AND RIGHT GREATER SAPHENOUS VEIN ENDOSCOPIC CONDUITS;  Surgeon: [name], MD;  Location: [place];  Service: Open Heart Surgery;  Laterality: N/A;   DIAGNOSTIC  LAPAROSCOPY     EYE SURGERY     IR CT HEAD LTD  04/19/2021   IR PERCUTANEOUS ART THROMBECTOMY/INFUSION INTRACRANIAL INC DIAG ANGIO  04/19/2021       IR PERCUTANEOUS ART THROMBECTOMY/INFUSION INTRACRANIAL INC DIAG ANGIO  04/19/2021   IR US GUIDE VASC ACCESS LEFT  04/19/2021   RADIOLOGY WITH ANESTHESIA N/A 04/19/2021   Procedure: IR WITH ANESTHESIA;  Surgeon: Luanne Bras, MD;  Location: [place];  Service: Radiology;  Laterality: N/A;   STERNAL WOUND DEBRIDEMENT N/A 08/24/2021   Procedure: STERNAL WOUND DRAINAGE AND DEBRIDEMENT;  Surgeon: Gaye Pollack, MD;  Location: [place] [facility];  Service: Thoracic;  Laterality: N/A;   STERNAL WOUND DEBRIDEMENT N/A 08/28/2021   Procedure: STERNAL WOUND DEBRIDEMENT;  Surgeon: [name], MD;  Location: [place];  Service: Thoracic;  Laterality: N/A;   TEE WITHOUT CARDIOVERSION N/A 04/24/2021   Procedure: TRANSESOPHAGEAL ECHOCARDIOGRAM (TEE);  Surgeon: Sueanne Margarita, MD;  Location: [facility] ENDOSCOPY;  Service: Cardiovascular;  Laterality: N/A;   TEE WITHOUT CARDIOVERSION N/A 08/04/2021   Procedure: TRANSESOPHAGEAL ECHOCARDIOGRAM (TEE);  Surgeon: [name], MD;  Location: [place];  Service: Open Heart Surgery;  Laterality: N/A;   TOTAL HIP ARTHROPLASTY Right 04/04/2018   Procedure: TOTAL HIP ARTHROPLASTY;  Surgeon: Dereck Leep, MD;  Location: [facility];  Service: Orthopedics;  Laterality: Right;   Patient Active Problem List   Diagnosis Date Noted   Colon cancer screening    Subcutaneous nodule    Diabetic peripheral neuropathy ([place])    Debility 08/11/2021   S/P CABG x 4 08/04/2021   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   Hyperkalemia    Chronic bilateral low back pain without sciatica    Leukocytosis    Hyponatremia    Essential hypertension    Slow transit constipation    Hemiparesis affecting left side as late effect of stroke ([place])    History of CVA with residual deficit 04/19/2021   Dyslipidemia, goal LDL below 70 04/18/2021   Recurrent major depressive disorder, in partial remission ([place]) 09/20/2020   Status post total replacement of hip 04/04/2018   Primary osteoarthritis of right hip 02/20/2018   Allergic rhinitis 10/28/2015   Acute stress disorder 08/01/2015   Anxiety 08/01/2015   Clinical depression 08/01/2015   Diabetes mellitus with coincident hypertension ([place]) 08/01/2015   Diverticulosis of colon 08/01/2015   Generalized pruritus 08/01/2015   BP (high blood pressure) 08/01/2015   Cannot sleep 08/01/2015   Adiposity 08/01/2015   Detrusor muscle hypertonia 08/01/2015   Hypercholesterolemia without hypertriglyceridemia 08/01/2015   Female stress incontinence 08/01/2015   Avitaminosis D 08/01/2015    ONSET DATE:  04/18/2021  REFERRING DIAG: hemiparesis affecting L side as late effect of CVA; dsymetria  THERAPY DIAG:  Muscle weakness (generalized)  Other lack of coordination  Right thalamic infarction [facility])  Rationale for Evaluation and Treatment Rehabilitation  PERTINENT HISTORY: Pt s/p R CVA on 04/18/21. Pt was initially recovering very well from a stroke standpoint with mild LUE >LLE numbness and ambulating with a cane. 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 Therapy eval's recommended CIR for decreased functional mobility - d/c to CIR on 8/29. She required incision debridement of sternal wound and application of VAC eventually discontinued on 9/29. She was discharged home on 9/30 (after a 32 day stay).  PRECAUTIONS: Fall  SUBJECTIVE: Pt  reports oval 8 splint worked very well and ended up as a good, comfortable fit.  PAIN:  Are you having pain? Yes, 1-2/10 pain in L LF.  Heat and massage effective to reduce pain.    OBJECTIVE:    [facility] OT Assessment - 07/01/22 0001       07/01/22 08/18/22 08/27/22           Observation/Other Assessments      Focus on Therapeutic Outcomes (FOTO)  61  59 NT           Coordination      Gross Motor Movements are Fluid and Coordinated No  No (improving) No (improving)    Fine Motor Movements are Fluid and Coordinated No  No (improving) No (improving)    Right 9 Hole Peg Test 31 sec  NT NT    Left 9 Hole Peg Test 3 min 44 sec   improved from 5 min 38 sec 2 min 50 sec (improved from 3 min 44 sec) 2 min 9 sec            Hand Function      Right Hand Grip (lbs) 35  NT NT    Right Hand Lateral Pinch 10 lbs  NT NT    Right Hand 3 Point Pinch 7 lbs  NT NT    Left Hand Grip (lbs) 10   decline (L LF pain from triggering limiting grip strength) 23# 25#    Left Hand Lateral Pinch 6 lbs  8# 9#    Left 3 point  pinch 5 lbs   finger slipping 8# 6# (finger slipping)      TODAY'S TREATMENT:  Paraffin: 10 min To L hand for muscle relaxation/pain management in prep for neuro re-ed activities.  Manual Therapy: Soft tissue massage for increasing circulation, reducing pain, and decreasing edema to L LF PIP joint.       Neuro re-ed: Facilitated L hand dexterity skills and [place] working to pick up washers and hang onto hooks on table top white board.  Pt practiced picking up magnets from table top (non-skid surface) and placing magnets at a circular target on whiteboard, OT providing min vc for resting forearm on table top when picking up items from table, working to use more normalized movement patterns. Practiced storing multiple magnets in hand and discarding 1 at a time, requiring cues for technique.  PATIENT EDUCATION:  Education details: Continue to limit repetitive flexion of L 3rd digit and wear splint with activity to prevent triggering.  Person educated: Patient Education method: Explanation and Verbal cues Education comprehension: verbalized understanding, returned demonstration, and needs further education   HOME EXERCISE PROGRAM FMC/GMC activities for LUE    OT Short Term Goals -        OT SHORT TERM GOAL #1   Title Pt will be indep to perform LUE HEP for coordination and strengthening.    Baseline Eval: not yet initiated; 06/30/22: pt remains consistent with HEP and avoiding repetitive gripping that causes pain in L LF; 08/18/22: pt remains consistent to engage LUE; HEP ongoing as function improves; 08/27/22: same as 08/18/22   Time 6    Period Weeks    Status ongoing   Target Date 09/29/22     OT SHORT TERM GOAL #2   Title Pt to be indep with joint protection and pain management strategies to limit triggering of L hand 3rd digit PIP.    Baseline Eval: Trigger finger splint recommended, initiated joint protection  educ; 06/30/22: pt is consistent to wear her trigger finger splint and avoids  repetitive gripping which causes pain to L LF PIP joint, though pt is still experiencing moderate pain in this joint with occasional triggering.  Pt plans to follow up with MD next week; 08/18/22: pt is indep with joint protection, though she states triggering has worsened since her cortisone injection, yet her pain is better.  Possibility that pt is using her hand more d/t less pain and OT reminded pt to limit repetition with gripping; 08/27/22: same as 08/18/22   Time 6    Period Weeks    Status ongoing   Target Date 09/29/22              OT Long Term Goals -       OT LONG TERM GOAL #1   Title Pt will increase FOTO by 1-2 points to indicate improvement in perceived functional indep with daily tasks.    Baseline Eval: FOTO 60; 06/30/22: 61; 08/18/22: 59; 08/27/22: NT this date   Time 12    Period Weeks    Status ongoing   Target Date 11/10/22     OT LONG TERM GOAL #2   Title Pt will demonstrate increased L FMC/GMC to enable indep with putting hair in ponytail.    Baseline Eval: unable; 06/30/22: pt can hold her hair in a ponytail with BUEs but can not yet manage the rubber band; 08/18/22: pt can tie hair in ponytail   Time 12    Period Weeks    Status Achieved    Target Date 08/10/22      OT LONG TERM GOAL #3   Title Pt will increase L FMC to enable indep with tying shoe laces.    Baseline Eval: unable to tie laces; wears slip on shoes; 06/30/22: pt still wearing slip on shoes; 08/18/22: pt stated she tied her laces the other day, but it took her 4 trials; 08/27/22: same as 08/18/22   Time 12    Period Weeks    Status ongoing   Target Date 11/10/22     OT LONG TERM GOAL #4   Title Pt will increase L hand dexterity skills to enable pt to pick up coins from table with L hand.    Baseline Eval: unable; 06/30/22: pt can pick up coins from table with extra time.  Dexterity skills improving as noted by improved 9 hole peg test from 5 min 38 sec to 3 min 44 sec on the L; 08/18/22: Pt picks up coins  from table with mild-moderate difficulty, L 9 hole 2 min 50 sec; 08/27/22: L 9 hole 2 min 9 secs   Time 12    Period Weeks    Status ongoing   Target Date 11/10/22     OT LONG TERM GOAL #5   Title Pt will increase L grip strength by 5 or more lbs to improve ability to hold and carry ADL supplies in L hand.    Baseline Eval: L grip 17# (R 35#); 06/30/22: L grip 10# (pain in L LF significantly limiting grip as finger continues to trigger; 08/18/22: L grip 23#; 08/27/22: L grip 25#   Time 12    Period Weeks    Status ongoing   Target Date 11/10/22            Plan - 06/02/22 1036     Clinical Impression Statement Pt seen today for OT tx for GMC/FMC skills in the LUE.  Pt  tends to struggle most when moving these items from palm to fingertips in prep for placement at a target, but did have fewer dropped items today.  Pt reports oval 8 splint for L LF worked well and seemed to be a good, comfortable fit.  Pt continues to present with moderate ataxia in the LUE, though improving.  Pt will continue to benefit from skilled OT for pain and edema management in L LF, and increasing LUE strength and coordination skills in order to increase efficiency when engaging LUE into daily tasks.      OT Occupational Profile and History Problem Focused Assessment - Including review of records relating to presenting problem    Occupational performance deficits (Please refer to evaluation for details): ADL's;IADL's;Work    Body Structure / Function / Physical Skills ADL;GMC;UE functional use;FMC;Decreased knowledge of use of DME;Pain;Strength;Coordination;Dexterity;IADL;Sensation;Balance    Rehab Potential Good    Clinical Decision Making Limited treatment options, no task modification necessary    Comorbidities Affecting Occupational Performance: May have comorbidities impacting occupational performance    Modification or Assistance to Complete Evaluation  No modification of tasks or assist necessary to complete eval     OT Frequency 2x / week    OT Duration 12 weeks    OT Treatment/Interventions Self-care/ADL training;Therapeutic exercise;Therapeutic activities;Cryotherapy;Moist Heat;Neuromuscular education;DME and/or AE instruction;Manual Therapy;Splinting;Patient/family education    Plan OT focus on LUE coordination/strength training and education/splinting for L hand 3rd digit PIP triggering    OT Home Exercise Plan L GMC/FMC tasks, strengthening as tolerated   Consulted and Agree with Plan of Care Patient            Leta Speller, MS, OTR/L   Darleene Cleaver, OT 10/04/2022, 3:47 PM

## 2022-10-05 ENCOUNTER — Encounter: Payer: PPO | Admitting: Physical Medicine and Rehabilitation

## 2022-10-06 ENCOUNTER — Ambulatory Visit: Payer: PPO

## 2022-10-08 ENCOUNTER — Ambulatory Visit: Payer: PPO

## 2022-10-13 ENCOUNTER — Ambulatory Visit: Payer: PPO

## 2022-10-13 ENCOUNTER — Ambulatory Visit: Payer: PPO | Admitting: Physical Medicine and Rehabilitation

## 2022-10-13 DIAGNOSIS — R262 Difficulty in walking, not elsewhere classified: Secondary | ICD-10-CM

## 2022-10-13 DIAGNOSIS — R278 Other lack of coordination: Secondary | ICD-10-CM

## 2022-10-13 DIAGNOSIS — M6281 Muscle weakness (generalized): Secondary | ICD-10-CM

## 2022-10-13 DIAGNOSIS — R2681 Unsteadiness on feet: Secondary | ICD-10-CM

## 2022-10-13 DIAGNOSIS — I6381 Other cerebral infarction due to occlusion or stenosis of small artery: Secondary | ICD-10-CM

## 2022-10-13 DIAGNOSIS — R269 Unspecified abnormalities of gait and mobility: Secondary | ICD-10-CM

## 2022-10-13 DIAGNOSIS — R2689 Other abnormalities of gait and mobility: Secondary | ICD-10-CM

## 2022-10-13 NOTE — Therapy (Signed)
OUTPATIENT PHYSICAL THERAPY NEURO TREATMENT NOTE   Patient Name: [name] MRN: [id number] DOB:09-12-1956, 66 y.o., female Today's Date: 10/13/2022   PCP: Birdie Sons, MD REFERRING PROVIDER: Frann Rider, NP   PT End of Session - 10/13/22 1149     Visit Number 3    Number of Visits 24    Date for PT Re-Evaluation 11/24/22    Progress Note Due on Visit 10    PT Start Time 1100    PT Stop Time 1142    PT Time Calculation (min) 42 min    Equipment Utilized During Treatment Gait belt    Activity Tolerance Patient tolerated treatment well;Patient limited by fatigue    Behavior During Therapy WFL for tasks assessed/performed               Past Medical History:  Diagnosis Date   Anxiety    Arthritis    CAD (coronary artery disease)    a. 3 vessel CAD noted on coronary CTA 04/2021, s/p CABG x4   Cataract    left   Depression    Diabetes mellitus without complication ([place])    type 2   Environmental and seasonal allergies    GERD (gastroesophageal reflux disease)    Hyperlipidemia    Hypertension    Joint pain    as reported by patient   Papillary fibroelastoma of heart    of the aortic valve,  s/p AVR and tumor resection on 08/04/2021   Right thalamic infarction ([place]) 04/29/2021   S/P AVR (aortic valve replacement)    s/p AVR with resection of AV fibroelastoma on 08/04/2021   S/P CABG x 4    a. s/p CABG x4 LIMA to LAD, SVG to PDA, and Sequential SVG to OM and Diagonal on 8/   Stroke ([place]) 04/2021   admitted at [facility] - left sided weakness   Urinary incontinence    as stated by patient   Past Surgical History:  Procedure Laterality Date   AORTIC VALVE REPLACEMENT N/A 08/04/2021   Procedure: RESECTION AORTIC VALVE TUMOR;  Surgeon: [name], MD;  Location: [place];  Service: Open Heart Surgery;  Laterality: N/A;   APPLICATION OF WOUND VAC N/A 08/24/2021   Procedure: APPLICATION OF WOUND VAC;  Surgeon: Gaye Pollack, MD;  Location: [place]  OR;  Service: Thoracic;  Laterality: N/A;   APPLICATION OF WOUND VAC N/A 08/28/2021   Procedure: WOUND VAC CHANGE;  Surgeon: [name], MD;  Location: [place];  Service: Thoracic;  Laterality: N/A;   BREAST EXCISIONAL BIOPSY Right 1999   neg   BUBBLE STUDY  04/24/2021   Procedure: BUBBLE STUDY;  Surgeon: Sueanne Margarita, MD;  Location: [place];  Service: Cardiovascular;;   CATARACT EXTRACTION  12/2011   CATARACT EXTRACTION W/PHACO Left 12/02/2016   Procedure: CATARACT EXTRACTION PHACO AND INTRAOCULAR LENS PLACEMENT ([place]);  Surgeon: Estill Cotta, MD;  Location: [facility];  Service: Ophthalmology;  Laterality: Left;  [place] 2:14 AP% 28.4 CDE 59.73 Fluid pack lot # [id number]   COLONOSCOPY WITH PROPOFOL N/A 04/13/2022   Procedure: COLONOSCOPY WITH PROPOFOL;  Surgeon: Lin Landsman, MD;  Location: [facility] ENDOS[facility];  Service: Gastroenterology;  Laterality: N/A;   CORONARY ARTERY BYPASS GRAFT N/A 08/04/2021   Procedure: CORONARY ARTERY BYPASS GRAFTING (CABG) X  4  USING LEFT INTERNAL MAMMARY ARTERY AND RIGHT GREATER SAPHENOUS VEIN ENDOSCOPIC CONDUITS;  Surgeon: [name], MD;  Location: [place];  Service: Open Heart Surgery;  Laterality:  N/A;   DIAGNOSTIC LAPAROSCOPY     EYE SURGERY     IR CT HEAD LTD  04/19/2021   IR PERCUTANEOUS ART THROMBECTOMY/INFUSION INTRACRANIAL INC DIAG ANGIO  04/19/2021       IR PERCUTANEOUS ART THROMBECTOMY/INFUSION INTRACRANIAL INC DIAG ANGIO  04/19/2021   IR US GUIDE VASC ACCESS LEFT  04/19/2021   RADIOLOGY WITH ANESTHESIA N/A 04/19/2021   Procedure: IR WITH ANESTHESIA;  Surgeon: Luanne Bras, MD;  Location: [place];  Service: Radiology;  Laterality: N/A;   STERNAL WOUND DEBRIDEMENT N/A 08/24/2021   Procedure: STERNAL WOUND DRAINAGE AND DEBRIDEMENT;  Surgeon: Gaye Pollack, MD;  Location: [place] [facility];  Service: Thoracic;  Laterality: N/A;   STERNAL WOUND DEBRIDEMENT N/A 08/28/2021   Procedure: STERNAL WOUND DEBRIDEMENT;  Surgeon:  [name], MD;  Location: [place];  Service: Thoracic;  Laterality: N/A;   TEE WITHOUT CARDIOVERSION N/A 04/24/2021   Procedure: TRANSESOPHAGEAL ECHOCARDIOGRAM (TEE);  Surgeon: Sueanne Margarita, MD;  Location: [facility];  Service: Cardiovascular;  Laterality: N/A;   TEE WITHOUT CARDIOVERSION N/A 08/04/2021   Procedure: TRANSESOPHAGEAL ECHOCARDIOGRAM (TEE);  Surgeon: [name], MD;  Location: [place];  Service: Open Heart Surgery;  Laterality: N/A;   TOTAL HIP ARTHROPLASTY Right 04/04/2018   Procedure: TOTAL HIP ARTHROPLASTY;  Surgeon: Dereck Leep, MD;  Location: [facility];  Service: Orthopedics;  Laterality: Right;   Patient Active Problem List   Diagnosis Date Noted   Colon cancer screening    Subcutaneous nodule    Diabetic peripheral neuropathy ([place])    Debility 08/11/2021   S/P CABG x 4 08/04/2021   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   Hyperkalemia    Chronic bilateral low back pain without sciatica    Leukocytosis    Hyponatremia    Essential hypertension    Slow transit constipation    Hemiparesis affecting left side as late effect of stroke ([place])    History of CVA with residual deficit 04/19/2021   Dyslipidemia, goal LDL below 70 04/18/2021   Recurrent major depressive disorder, in partial remission ([place]) 09/20/2020   Status post total replacement of hip 04/04/2018   Primary osteoarthritis of right hip 02/20/2018   Allergic rhinitis 10/28/2015   Acute stress disorder 08/01/2015   Anxiety 08/01/2015   Clinical depression 08/01/2015   Diabetes mellitus with coincident hypertension ([place]) 08/01/2015   Diverticulosis of colon 08/01/2015   Generalized pruritus 08/01/2015   BP (high blood pressure) 08/01/2015   Cannot sleep 08/01/2015   Adiposity 08/01/2015   Detrusor muscle hypertonia 08/01/2015   Hypercholesterolemia without hypertriglyceridemia 08/01/2015   Female stress incontinence 08/01/2015   Avitaminosis D 08/01/2015    ONSET DATE:  04/18/22  REFERRING DIAG: I63.9 (ICD-10-CM) - CVA (cerebral vascular accident) ([place])  THERAPY DIAG:  Muscle weakness (generalized)  Other lack of coordination  Right thalamic infarction ([facility])  Unsteadiness on feet  Abnormality of gait and mobility  Difficulty in walking, not elsewhere classified  Other abnormalities of gait and mobility  Rationale for Evaluation and Treatment Rehabilitation  SUBJECTIVE:  SUBJECTIVE STATEMENT: Patient reports doing pretty well- Denies any pain today or any falls since last visit. Patient states mostly only using cane for community outings.   Pt accompanied by: self  PERTINENT HISTORY: Pt s/p R CVA on 04/18/21.  Pt was initially recovering very well from a stroke standpoint with mild LUE >LLE numbness and ambulating with a cane. 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  Therapy eval's recommended CIR for decreased functional mobility - d/c to CIR on 8/29.  She required incision debridement of sternal wound and application of VAC eventually discontinued on 9/29.  She was discharged home on 9/30 (after a 32 day stay).  PMH per chart significant for some of the following: anxiety, depression, CAD, DM with coincident HTN, female stress incontinence, s/p hip replacement (right, 2019), chronic B LBP without sciatica, diabetic peripheral  neuropathy.  PAIN:  Are you having pain? No  PRECAUTIONS: None  WEIGHT BEARING RESTRICTIONS No  FALLS: Has patient fallen in last 6 months? No  LIVING ENVIRONMENT: Lives with: lives alone Lives in: House/apartment Stairs: Yes: External: 4 steps; on right going up Has  following equipment at home: Quad cane small base, Walker - 4 wheeled, and shower chair  PLOF: Independent and Independent with gait  PATIENT GOALS Pt wants to be able to walk better, walk with less support when she goes out, and pt eventual goal of being able to work.   OBJECTIVE: (objective measures completed at initial evaluation unless otherwise dated)   COGNITION: Overall cognitive status: Within functional limits for tasks assessed   SENSATION: Light touch: WFL Proprioception: Impaired  at the R great toe   COORDINATION: WNL LE   EDEMA:  None presents in LE at this time         POSTURE: No Significant postural limitations  LOWER EXTREMITY ROM:   WNL LOWER EXTREMITY MMT:    MMT Right Eval Left Eval  Hip flexion 4+ 4  Hip extension    Hip abduction 5* 5*  Hip adduction 5* 5*  Hip internal rotation    Hip external rotation    Knee flexion 5 4+  Knee extension 5 4+  Ankle dorsiflexion 5 5  Ankle plantarflexion 5* 5*  Ankle inversion    Ankle eversion    (Blank rows = not tested) *= not tested in official/ gravity dependent position   BED MOBILITY:  Not tested but [facility] per repoty   TRANSFERS: Assistive device utilized: Single point cane  Sit to stand: Modified independence Stand to sit: Complete Independence Chair to chair: Modified independence Floor:  Not tested      GAIT: Gait pattern: step through pattern, decreased arm swing- Left, decreased step length- Right, and decreased stride length Distance walked: 660 ft Assistive device utilized: Single point cane Level of assistance: Modified independence Comments: Pt has ataxia with gait, pt with fatigue shows inattention to envoronment hitting cane against wall on multiple occassions. No specific LOB occurred during ambulation.   FUNCTIONAL TESTs:  5 times sit to stand: 20.11 sec 6 minute walk test: 660 feet 10 meter walk test: .61 m/s Berg Balance Scale: Test visit 2  PATIENT SURVEYS:   FOTO 62  TODAY'S TREATMENT:   NMR:   Tandem standing- multiple attempts each LE - Up to 12-15 sec (unsteadiness more with left LE in back)   SLS-Multiple attempts each  LE (ranging from 2 sec to 6 sec each LE) without UE support (increased difficulty and unsteadiness overall)      TherEx:     Review of HEP from previous visit - Sit to stand with VC and standing hip flex- 10 reps of each - patient performed better after VC and visual demonstration    Standing calf raises 2.5 AW 2 sets x 10 reps each LE    Standing toe raises 2.5 AW 2 sets x 10 reps each LE  LAQ with 2.5 # AW donned 2x10 each LE   Seated march with 1.5# AW 1x10 each LE  Seated hip flex/abd/Add up and over cone with 2.5 AW x 10 reps each LE  Ambulation with SBQC and 2.5# AW x 160 feet - Good reciprocal steps and patient demo fatigue as limiting factor.    PATIENT EDUCATION: Education details: further assessment indications, HEP, exercise technique Person educated: Patient Education method: Explanation, Demonstration, Tactile cues, Verbal cues, and Handouts Education comprehension: verbalized understanding   HOME EXERCISE PROGRAM: Access Code: [email or web address] URL: [email or web address] Date: 10/02/2022 Prepared by: Ricard Dillon  Exercises - Sit to Stand with Counter Support  - 1 x daily - 5 x weekly - 3 sets - 10 reps - Standing March with Counter Support  - 1 x daily - 7 x weekly - 2 sets - 2 reps - 30 hold    GOALS: Goals reviewed with patient? Yes  SHORT TERM GOALS: Target date: 10/27/2022     Patient will be independent in home exercise program to improve strength/mobility for better functional independence with ADLs. Baseline: No HEP currently  Goal status: INITIAL  LONG TERM GOALS: Target date: 01/05/2023  1.  Patient (> 72 years old) will complete five times sit to stand test in < 15 seconds indicating an increased LE strength and improved balance. Baseline: 20.11 sec Goal  status: INITIAL  2.  Patient will increase FOTO to 70 to demonstrate statistically significant improvement in mobility and quality of life.  Baseline: 62 Goal status: INITIAL   3.  Patient will increase Berg Balance score by > 6 points to demonstrate decreased fall risk during functional activities. Baseline: 40/56 Goal status: INITIAL    4.   Patient will increase 10 meter walk test to >1.29ms as to improve gait speed for better community ambulation and to reduce fall risk. Baseline:.662m Goal status: INITIAL  5.   Patient will increase six minute walk test distance to >1000 for progression to community ambulator and improve gait ability Baseline:  660 feet in 5:25 Goal status: INITIAL    ASSESSMENT:  CLINICAL IMPRESSION:  Patient presented with good motivation- Receptive to all interventions- able to follow commands and recommendations. She continues to endorse fatigue as limiting factor and will benefit from review of today's strengthening.   The patient will benefit from skilled physical therapy interventions in order to improve her lower extremity strength, balance, mobility, and quality of life.  OBJECTIVE IMPAIRMENTS Abnormal gait, decreased activity tolerance, decreased balance, decreased endurance, decreased mobility, difficulty walking, and impaired perceived functional ability.   ACTIVITY LIMITATIONS lifting, standing, squatting, stairs, and locomotion level  PARTICIPATION LIMITATIONS: cleaning, laundry, and yard work  PERSONAL FACTORS Age and 3+ comorbidities: CAD, Arhritis, HLD, HTN  are also affecting patient's functional outcome.   REHAB POTENTIAL: Good  CLINICAL DECISION MAKING: Stable/uncomplicated  EVALUATION COMPLEXITY: Low  PLAN: PT FREQUENCY: 2x/week  PT DURATION: 12 weeks  PLANNED INTERVENTIONS: Therapeutic exercises, Therapeutic activity, Neuromuscular re-education, Balance training,  Gait training, Patient/Family education, Self Care, Joint  mobilization, and Stair training  PLAN FOR NEXT SESSION: balance, endurance, strength and gait   Lewis Moccasin, PT 10/13/2022, 11:57 AM

## 2022-10-13 NOTE — Therapy (Signed)
OUTPATIENT OCCUPATIONAL THERAPY TREATMENT NOTE    Patient Name: [name] MRN: [id number] DOB:[birth year], 66 y.o., female Today's Date: 10/13/2022  PCP: Dr. Birdie Sons REFERRING PROVIDER: Frann Rider, NP   OT End of Session - 10/13/22 1153     Visit Number 26    Number of Visits 18    Date for OT Re-Evaluation 11/10/22    Authorization Time Period Reporting period beginning 08/27/22    OT Start Time 1145    OT Stop Time 37    OT Time Calculation (min) 45 min    Equipment Utilized During Treatment [facility]    Activity Tolerance Patient tolerated treatment well    Behavior During Therapy [facility] for tasks assessed/performed               Past Medical History:  Diagnosis Date   Anxiety    Arthritis    CAD (coronary artery disease)    a. 3 vessel CAD noted on coronary CTA 04/2021, s/p CABG x4   Cataract    left   Depression    Diabetes mellitus without complication ([place])    type 2   Environmental and seasonal allergies    GERD (gastroesophageal reflux disease)    Hyperlipidemia    Hypertension    Joint pain    as reported by patient   Papillary fibroelastoma of heart    of the aortic valve,  s/p AVR and tumor resection on 08/04/2021   Right thalamic infarction ([place]) 04/29/2021   S/P AVR (aortic valve replacement)    s/p AVR with resection of AV fibroelastoma on 08/04/2021   S/P CABG x 4    a. s/p CABG x4 LIMA to LAD, SVG to PDA, and Sequential SVG to OM and Diagonal on 8/   Stroke ([place]) 04/2021   admitted at [facility] - left sided weakness   Urinary incontinence    as stated by patient   Past Surgical History:  Procedure Laterality Date   AORTIC VALVE REPLACEMENT N/A 08/04/2021   Procedure: RESECTION AORTIC VALVE TUMOR;  Surgeon: [name], MD;  Location: [place];  Service: Open Heart Surgery;  Laterality: N/A;   APPLICATION OF WOUND VAC N/A 08/24/2021   Procedure: APPLICATION OF WOUND VAC;  Surgeon: Gaye Pollack, MD;  Location: [place] OR;   Service: Thoracic;  Laterality: N/A;   APPLICATION OF WOUND VAC N/A 08/28/2021   Procedure: WOUND VAC CHANGE;  Surgeon: [name], MD;  Location: [place];  Service: Thoracic;  Laterality: N/A;   BREAST EXCISIONAL BIOPSY Right 1999   neg   BUBBLE STUDY  04/24/2021   Procedure: BUBBLE STUDY;  Surgeon: Sueanne Margarita, MD;  Location: [place];  Service: Cardiovascular;;   CATARACT EXTRACTION  12/2011   CATARACT EXTRACTION W/PHACO Left 12/02/2016   Procedure: CATARACT EXTRACTION PHACO AND INTRAOCULAR LENS PLACEMENT ([place]);  Surgeon: Estill Cotta, MD;  Location: [facility] ORS;  Service: Ophthalmology;  Laterality: Left;  [place] 2:14 AP% 28.4 CDE 59.73 Fluid pack lot # [id number]   COLONOSCOPY WITH PROPOFOL N/A 04/13/2022   Procedure: COLONOSCOPY WITH PROPOFOL;  Surgeon: Lin Landsman, MD;  Location: [facility] ENDOSCOPY;  Service: Gastroenterology;  Laterality: N/A;   CORONARY ARTERY BYPASS GRAFT N/A 08/04/2021   Procedure: CORONARY ARTERY BYPASS GRAFTING (CABG) X  4  USING LEFT INTERNAL MAMMARY ARTERY AND RIGHT GREATER SAPHENOUS VEIN ENDOSCOPIC CONDUITS;  Surgeon: [name], MD;  Location: [place];  Service: Open Heart Surgery;  Laterality: N/A;   DIAGNOSTIC  LAPAROSCOPY     EYE SURGERY     IR CT HEAD LTD  04/19/2021   IR PERCUTANEOUS ART THROMBECTOMY/INFUSION INTRACRANIAL INC DIAG ANGIO  04/19/2021       IR PERCUTANEOUS ART THROMBECTOMY/INFUSION INTRACRANIAL INC DIAG ANGIO  04/19/2021   IR US GUIDE VASC ACCESS LEFT  04/19/2021   RADIOLOGY WITH ANESTHESIA N/A 04/19/2021   Procedure: IR WITH ANESTHESIA;  Surgeon: Luanne Bras, MD;  Location: [place];  Service: Radiology;  Laterality: N/A;   STERNAL WOUND DEBRIDEMENT N/A 08/24/2021   Procedure: STERNAL WOUND DRAINAGE AND DEBRIDEMENT;  Surgeon: Gaye Pollack, MD;  Location: [place] OR;  Service: Thoracic;  Laterality: N/A;   STERNAL WOUND DEBRIDEMENT N/A 08/28/2021   Procedure: STERNAL WOUND DEBRIDEMENT;  Surgeon: [name], MD;  Location: [place];  Service: Thoracic;  Laterality: N/A;   TEE WITHOUT CARDIOVERSION N/A 04/24/2021   Procedure: TRANSESOPHAGEAL ECHOCARDIOGRAM (TEE);  Surgeon: Sueanne Margarita, MD;  Location: [facility] ENDOSCOPY;  Service: Cardiovascular;  Laterality: N/A;   TEE WITHOUT CARDIOVERSION N/A 08/04/2021   Procedure: TRANSESOPHAGEAL ECHOCARDIOGRAM (TEE);  Surgeon: [name], MD;  Location: [place];  Service: Open Heart Surgery;  Laterality: N/A;   TOTAL HIP ARTHROPLASTY Right 04/04/2018   Procedure: TOTAL HIP ARTHROPLASTY;  Surgeon: Dereck Leep, MD;  Location: [facility] ORS;  Service: Orthopedics;  Laterality: Right;   Patient Active Problem List   Diagnosis Date Noted   Colon cancer screening    Subcutaneous nodule    Diabetic peripheral neuropathy ([place])    Debility 08/11/2021   S/P CABG x 4 08/04/2021   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   Hyperkalemia    Chronic bilateral low back pain without sciatica    Leukocytosis    Hyponatremia    Essential hypertension    Slow transit constipation    Hemiparesis affecting left side as late effect of stroke ([place])    History of CVA with residual deficit 04/19/2021   Dyslipidemia, goal LDL below 70 04/18/2021   Recurrent major depressive disorder, in partial remission ([place]) 09/20/2020   Status post total replacement of hip 04/04/2018   Primary osteoarthritis of right hip 02/20/2018   Allergic rhinitis 10/28/2015   Acute stress disorder 08/01/2015   Anxiety 08/01/2015   Clinical depression 08/01/2015   Diabetes mellitus with coincident hypertension ([place]) 08/01/2015   Diverticulosis of colon 08/01/2015   Generalized pruritus 08/01/2015   BP (high blood pressure) 08/01/2015   Cannot sleep 08/01/2015   Adiposity 08/01/2015   Detrusor muscle hypertonia 08/01/2015   Hypercholesterolemia without hypertriglyceridemia 08/01/2015   Female stress incontinence 08/01/2015   Avitaminosis D 08/01/2015    ONSET DATE:  04/18/2021  REFERRING DIAG: hemiparesis affecting L side as late effect of CVA; dsymetria  THERAPY DIAG:  Muscle weakness (generalized)  Other lack of coordination  Right thalamic infarction [facility])  Rationale for Evaluation and Treatment Rehabilitation  PERTINENT HISTORY: Pt s/p R CVA on 04/18/21. Pt was initially recovering very well from a stroke standpoint with mild LUE >LLE numbness and ambulating with a cane. 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 Therapy eval's recommended CIR for decreased functional mobility - d/c to CIR on 8/29. She required incision debridement of sternal wound and application of VAC eventually discontinued on 9/29. She was discharged home on 9/30 (after a 32 day stay).  PRECAUTIONS: Fall  SUBJECTIVE: Pt  reports some preoccupation and frustration with her grandkids and school, but she states things will be worked out soon.    PAIN:  Are you having pain? Yes, 1-2/10 pain in L LF.  Heat and massage effective to reduce pain.    OBJECTIVE:    [facility] OT Assessment - 07/01/22 0001       07/01/22 08/18/22 08/27/22           Observation/Other Assessments      Focus on Therapeutic Outcomes (FOTO)  61  59 NT           Coordination      Gross Motor Movements are Fluid and Coordinated No  No (improving) No (improving)    Fine Motor Movements are Fluid and Coordinated No  No (improving) No (improving)    Right 9 Hole Peg Test 31 sec  NT NT    Left 9 Hole Peg Test 3 min 44 sec   improved from 5 min 38 sec 2 min 50 sec (improved from 3 min 44 sec) 2 min 9 sec            Hand Function      Right Hand Grip (lbs) 35  NT NT    Right Hand Lateral Pinch 10 lbs  NT NT    Right Hand 3 Point Pinch 7 lbs  NT NT    Left Hand Grip (lbs) 10   decline (L LF pain from triggering limiting grip strength) 23# 25#    Left Hand  Lateral Pinch 6 lbs  8# 9#    Left 3 point pinch 5 lbs   finger slipping 8# 6# (finger slipping)      TODAY'S TREATMENT:  Paraffin: 10 min To L hand for muscle relaxation/pain management in prep for neuro re-ed activities.  Manual Therapy: Soft tissue massage for increasing circulation, reducing pain, and decreasing edema to L LF PIP joint.       Neuro re-ed: Facilitated L hand dexterity and [facility] skills working to pick up grooved pegs from dish and place them into grooved pegboard.  Pt had many dropped pegs, stating they felt slippery in her hand.  Mod vc for technique to reduce compensatory proximal movements and to facilitate isolated distal coordination.  Further practiced dexterity skills working with 9 piece cube puzzle on table top.  Pt required intermittent min A to line up cubes to form a straight line as ataxic movements tended to slide cubes out of position on table.  PATIENT EDUCATION:  Education details: Continue to limit repetitive flexion of L 3rd digit and wear splint with activity to prevent triggering.  Person educated: Patient Education method: Explanation and Verbal cues Education comprehension: verbalized understanding, returned demonstration, and needs further education   HOME EXERCISE PROGRAM FMC/GMC activities for LUE    OT Short Term Goals -        OT SHORT TERM GOAL #1   Title Pt will be indep to perform LUE HEP for coordination and strengthening.    Baseline Eval: not yet initiated; 06/30/22: pt remains consistent with HEP and avoiding repetitive gripping that causes pain in L LF; 08/18/22: pt remains consistent to engage LUE; HEP ongoing as function improves; 08/27/22: same as 08/18/22   Time 6    Period Weeks    Status ongoing   Target Date 09/29/22     OT SHORT TERM GOAL #2   Title Pt to be indep with joint protection and pain management strategies to limit triggering of  L hand 3rd digit PIP.    Baseline Eval: Trigger finger splint recommended, initiated  joint protection educ; 06/30/22: pt is consistent to wear her trigger finger splint and avoids repetitive gripping which causes pain to L LF PIP joint, though pt is still experiencing moderate pain in this joint with occasional triggering.  Pt plans to follow up with MD next week; 08/18/22: pt is indep with joint protection, though she states triggering has worsened since her cortisone injection, yet her pain is better.  Possibility that pt is using her hand more d/t less pain and OT reminded pt to limit repetition with gripping; 08/27/22: same as 08/18/22   Time 6    Period Weeks    Status ongoing   Target Date 09/29/22              OT Long Term Goals -       OT LONG TERM GOAL #1   Title Pt will increase FOTO by 1-2 points to indicate improvement in perceived functional indep with daily tasks.    Baseline Eval: FOTO 60; 06/30/22: 61; 08/18/22: 59; 08/27/22: NT this date   Time 12    Period Weeks    Status ongoing   Target Date 11/10/22     OT LONG TERM GOAL #2   Title Pt will demonstrate increased L FMC/GMC to enable indep with putting hair in ponytail.    Baseline Eval: unable; 06/30/22: pt can hold her hair in a ponytail with BUEs but can not yet manage the rubber band; 08/18/22: pt can tie hair in ponytail   Time 12    Period Weeks    Status Achieved    Target Date 08/10/22      OT LONG TERM GOAL #3   Title Pt will increase L FMC to enable indep with tying shoe laces.    Baseline Eval: unable to tie laces; wears slip on shoes; 06/30/22: pt still wearing slip on shoes; 08/18/22: pt stated she tied her laces the other day, but it took her 4 trials; 08/27/22: same as 08/18/22   Time 12    Period Weeks    Status ongoing   Target Date 11/10/22     OT LONG TERM GOAL #4   Title Pt will increase L hand dexterity skills to enable pt to pick up coins from table with L hand.    Baseline Eval: unable; 06/30/22: pt can pick up coins from table with extra time.  Dexterity skills improving as noted by  improved 9 hole peg test from 5 min 38 sec to 3 min 44 sec on the L; 08/18/22: Pt picks up coins from table with mild-moderate difficulty, L 9 hole 2 min 50 sec; 08/27/22: L 9 hole 2 min 9 secs   Time 12    Period Weeks    Status ongoing   Target Date 11/10/22     OT LONG TERM GOAL #5   Title Pt will increase L grip strength by 5 or more lbs to improve ability to hold and carry ADL supplies in L hand.    Baseline Eval: L grip 17# (R 35#); 06/30/22: L grip 10# (pain in L LF significantly limiting grip as finger continues to trigger; 08/18/22: L grip 23#; 08/27/22: L grip 25#   Time 12    Period Weeks    Status ongoing   Target Date 11/10/22            Plan - 06/02/22 1036  Clinical Impression Statement Pt reports oval 8 splint has been effective to prevent triggering of L LF PIP joint.  Pt required frequent rest breaks today between tasks d/t increased fatigue following PT session.  Pt continues to present with moderate ataxia in LUE, contributing to difficulty with picking up small items in L hand, and moving them toward a target with good accuracy.  Pt struggles with coordinating a 3 point pinch, as LF tends to slip.  Pt will continue to benefit from skilled OT for pain and edema management in L LF, and increasing LUE strength and coordination skills in order to increase efficiency when engaging LUE into daily tasks.      OT Occupational Profile and History Problem Focused Assessment - Including review of records relating to presenting problem    Occupational performance deficits (Please refer to evaluation for details): ADL's;IADL's;Work    Body Structure / Function / Physical Skills ADL;GMC;UE functional use;FMC;Decreased knowledge of use of DME;Pain;Strength;Coordination;Dexterity;IADL;Sensation;Balance    Rehab Potential Good    Clinical Decision Making Limited treatment options, no task modification necessary    Comorbidities Affecting Occupational Performance: May have comorbidities  impacting occupational performance    Modification or Assistance to Complete Evaluation  No modification of tasks or assist necessary to complete eval    OT Frequency 2x / week    OT Duration 12 weeks    OT Treatment/Interventions Self-care/ADL training;Therapeutic exercise;Therapeutic activities;Cryotherapy;Moist Heat;Neuromuscular education;DME and/or AE instruction;Manual Therapy;Splinting;Patient/family education    Plan OT focus on LUE coordination/strength training and education/splinting for L hand 3rd digit PIP triggering    OT Home Exercise Plan L GMC/FMC tasks, strengthening as tolerated   Consulted and Agree with Plan of Care Patient            Leta Speller, MS, OTR/L   Darleene Cleaver, OT 10/13/2022, 2:56 PM

## 2022-10-15 ENCOUNTER — Ambulatory Visit: Payer: PPO

## 2022-10-16 ENCOUNTER — Ambulatory Visit: Payer: PPO | Admitting: Physician Assistant

## 2022-10-16 ENCOUNTER — Other Ambulatory Visit: Payer: Self-pay

## 2022-10-16 ENCOUNTER — Ambulatory Visit (INDEPENDENT_AMBULATORY_CARE_PROVIDER_SITE_OTHER): Payer: PPO | Admitting: Family Medicine

## 2022-10-16 ENCOUNTER — Encounter: Payer: Self-pay | Admitting: Family Medicine

## 2022-10-16 VITALS — BP 121/78 | HR 76 | Temp 98.5°F | Resp 16 | Wt 178.5 lb

## 2022-10-16 DIAGNOSIS — E785 Hyperlipidemia, unspecified: Secondary | ICD-10-CM | POA: Diagnosis not present

## 2022-10-16 DIAGNOSIS — H6123 Impacted cerumen, bilateral: Secondary | ICD-10-CM

## 2022-10-16 DIAGNOSIS — D582 Other hemoglobinopathies: Secondary | ICD-10-CM

## 2022-10-16 DIAGNOSIS — I152 Hypertension secondary to endocrine disorders: Secondary | ICD-10-CM | POA: Diagnosis not present

## 2022-10-16 DIAGNOSIS — N182 Chronic kidney disease, stage 2 (mild): Secondary | ICD-10-CM

## 2022-10-16 DIAGNOSIS — E1122 Type 2 diabetes mellitus with diabetic chronic kidney disease: Secondary | ICD-10-CM | POA: Diagnosis not present

## 2022-10-16 DIAGNOSIS — E1169 Type 2 diabetes mellitus with other specified complication: Secondary | ICD-10-CM

## 2022-10-16 DIAGNOSIS — E559 Vitamin D deficiency, unspecified: Secondary | ICD-10-CM

## 2022-10-16 DIAGNOSIS — E1159 Type 2 diabetes mellitus with other circulatory complications: Secondary | ICD-10-CM | POA: Diagnosis not present

## 2022-10-16 DIAGNOSIS — J302 Other seasonal allergic rhinitis: Secondary | ICD-10-CM

## 2022-10-16 DIAGNOSIS — I6930 Unspecified sequelae of cerebral infarction: Secondary | ICD-10-CM | POA: Diagnosis not present

## 2022-10-16 MED ORDER — GUAIFENESIN-DM 100-10 MG/5ML PO SYRP
5.0000 mL | ORAL_SOLUTION | ORAL | 0 refills | Status: DC | PRN
  Filled 2022-10-16: qty 118, 4d supply, fill #0

## 2022-10-16 MED ORDER — ALBUTEROL SULFATE HFA 108 (90 BASE) MCG/ACT IN AERS
2.0000 | INHALATION_SPRAY | Freq: Four times a day (QID) | RESPIRATORY_TRACT | 2 refills | Status: DC | PRN
  Filled 2022-10-16: qty 8, fill #0

## 2022-10-16 MED ORDER — MONTELUKAST SODIUM 10 MG PO TABS
10.0000 mg | ORAL_TABLET | Freq: Every day | ORAL | 3 refills | Status: DC
  Filled 2022-10-16: qty 30, 30d supply, fill #0

## 2022-10-16 MED ORDER — CETIRIZINE HCL 10 MG PO TABS
10.0000 mg | ORAL_TABLET | Freq: Every day | ORAL | 11 refills | Status: DC
  Filled 2022-10-16: qty 30, 30d supply, fill #0

## 2022-10-16 MED ORDER — ALBUTEROL SULFATE HFA 108 (90 BASE) MCG/ACT IN AERS
2.0000 | INHALATION_SPRAY | Freq: Four times a day (QID) | RESPIRATORY_TRACT | 2 refills | Status: DC | PRN
  Filled 2022-10-16: qty 8.5, 25d supply, fill #0

## 2022-10-16 NOTE — Assessment & Plan Note (Signed)
Irrigation provided; pt tolerated poorly with complaints of dizziness Stopped irrigation

## 2022-10-16 NOTE — Assessment & Plan Note (Signed)
Acute exacerbation in s/o excess cerumen Recommend start of zyrtec, PRN guiaf-DM cough syrup, singulair and PRN albuterol to assist RTC if symptoms continue >7 days

## 2022-10-16 NOTE — Assessment & Plan Note (Signed)
>>  ASSESSMENT AND PLAN FOR HYPERCHOLESTEROLEMIA WITHOUT HYPERTRIGLYCERIDEMIA WRITTEN ON 10/13/2023 12:16 PM BY Candas Deemer, Demetrios Isaacs, MD  >>ASSESSMENT AND PLAN FOR HYPERLIPIDEMIA ASSOCIATED WITH TYPE 2 DIABETES MELLITUS (HCC) WRITTEN ON 10/16/2022  1:42 PM BY PAYNE, ELISE T, FNP  Chronic, previously elevated Repeat LP Last taking lipitor 40 mg LDL goal of <55 given hx of CVA

## 2022-10-16 NOTE — Assessment & Plan Note (Signed)
>>  ASSESSMENT AND PLAN FOR HYPERLIPIDEMIA ASSOCIATED WITH TYPE 2 DIABETES MELLITUS (HCC) WRITTEN ON 10/16/2022  1:42 PM BY PAYNE, ELISE T, FNP  Chronic, previously elevated Repeat LP Last taking lipitor 40 mg LDL goal of <55 given hx of CVA

## 2022-10-16 NOTE — Assessment & Plan Note (Signed)
Chronic, stable Has graduated from walker to cane Altered gait remains Pt denies sick contacts as rarely out of home

## 2022-10-16 NOTE — Assessment & Plan Note (Signed)
Chronic, previously elevated >8% Repeat A1c Due for urine micro albumin Currently taking glyburide 5 mg and metformin 500 mg BID On statin; not on ace

## 2022-10-16 NOTE — Progress Notes (Signed)
I,Sulibeya S Dimas,acting as a Education administrator for Gwyneth Sprout, FNP.,have documented all relevant documentation on the behalf of Gwyneth Sprout, FNP,as directed by  Gwyneth Sprout, FNP while in the presence of Gwyneth Sprout, FNP.   Established patient visit  Patient: [name]   DOB: July 21, 1956   66 y.o. Female  MRN: [id number] Visit Date: 10/16/2022  Today's healthcare provider: Gwyneth Sprout, FNP  Patient presents for new patient visit to establish care.  Introduced to Designer, jewellery role and practice setting.  All questions answered.  Discussed provider/patient relationship and expectations.  Chief Complaint  Patient presents with   Sore Throat   Subjective    Sore Throat  This is a new problem. The current episode started yesterday. The problem has been unchanged. There has been no fever. The pain is mild. Associated symptoms include congestion, coughing, ear pain, a hoarse voice and trouble swallowing. Pertinent negatives include no shortness of breath or swollen glands. She has had no exposure to strep. She has tried nothing for the symptoms.    Medications: Outpatient Medications Prior to Visit  Medication Sig   acetaminophen (TYLENOL) 325 MG tablet Take 2 tablets (650 mg total) by mouth every 4 (four) hours as needed for mild pain (or temp > 37.5 C (99.5 F)).   ALPRAZolam (XANAX) 0.5 MG tablet Take 1 tablet (0.5 mg total) by mouth 2 (two) times daily as needed for anxiety   amLODipine (NORVASC) 10 MG tablet Take 1 tablet (10 mg total) by mouth daily.   aspirin 81 MG EC tablet Take 1 tablet (81 mg total) by mouth daily. Swallow whole.   atorvastatin (LIPITOR) 40 MG tablet Take 1 tablet (40 mg total) by mouth daily.   Blood Glucose Monitoring Suppl (FREESTYLE FREEDOM LITE) w/Device KIT USE AS DIRECTED   gabapentin (NEURONTIN) 300 MG capsule Take 1-2 capsules (300-600 mg total) by mouth at bedtime.   glucose blood (FREESTYLE LITE) test strip Also needs Lancets. Use to check  blood sugar up to four times a day for insulin dependant diabetes   glyBURIDE (DIABETA) 5 MG tablet Take 1 tablet (5 mg total) by mouth daily with breakfast.   latanoprost (XALATAN) 0.005 % ophthalmic solution Place 1 drop into the left eye at bedtime.   losartan (COZAAR) 100 MG tablet Take 1 tablet (100 mg total) by mouth daily.   metFORMIN (GLUCOPHAGE) 500 MG tablet Take 1 tablet (500 mg total) by mouth 2 (two) times daily with a meal.   Multiple Vitamin (MULTIVITAMIN) tablet Take 1 tablet by mouth daily.   sertraline (ZOLOFT) 25 MG tablet Take 1 tablet by mouth at bedtime.   No facility-administered medications prior to visit.    Review of Systems  Constitutional:  Negative for chills and fever.  HENT:  Positive for congestion, ear pain, hoarse voice and trouble swallowing.   Respiratory:  Positive for cough and wheezing. Negative for shortness of breath.      Objective    BP 121/78 (BP Location: Left Arm, Patient Position: Sitting, Cuff Size: Large)   Pulse 76   Temp 98.5 F (36.9 C) (Oral)   Resp 16   Wt 178 lb 8 oz (81 kg)   SpO2 98%   BMI 27.96 kg/m  BP Readings from Last 3 Encounters:  10/16/22 121/78  08/25/22 (!) 162/90  04/13/22 (!) 143/87   Wt Readings from Last 3 Encounters:  10/16/22 178 lb 8 oz (81 kg)  08/25/22 171 lb (77.6 kg)  04/13/22 161 lb (73 kg)    Physical Exam Vitals and nursing note reviewed.  Constitutional:      General: She is not in acute distress.    Appearance: Normal appearance. She is overweight. She is not ill-appearing, toxic-appearing or diaphoretic.  HENT:     Head: Normocephalic and atraumatic.     Right Ear: Tympanic membrane, ear canal and external ear normal.     Left Ear: Tympanic membrane, ear canal and external ear normal.     Nose: Congestion present.     Mouth/Throat:     Mouth: Mucous membranes are moist.     Pharynx: Oropharynx is clear. No oropharyngeal exudate or posterior oropharyngeal erythema.  Cardiovascular:      Rate and Rhythm: Normal rate and regular rhythm.     Pulses: Normal pulses.     Heart sounds: Normal heart sounds. No murmur heard.    No friction rub. No gallop.  Pulmonary:     Effort: Pulmonary effort is normal. No respiratory distress.     Breath sounds: Normal breath sounds. No stridor. No wheezing, rhonchi or rales.  Chest:     Chest wall: No tenderness.  Abdominal:     General: Bowel sounds are normal.     Palpations: Abdomen is soft.  Musculoskeletal:        General: No swelling, tenderness, deformity or signs of injury. Normal range of motion.     Right lower leg: No edema.     Left lower leg: No edema.     Comments: Impaired gait with use of cane s/p CVA  Skin:    General: Skin is warm and dry.     Capillary Refill: Capillary refill takes less than 2 seconds.     Coloration: Skin is not jaundiced or pale.     Findings: No bruising, erythema, lesion or rash.  Neurological:     General: No focal deficit present.     Mental Status: She is alert and oriented to person, place, and time. Mental status is at baseline.     Cranial Nerves: No cranial nerve deficit.     Sensory: No sensory deficit.     Motor: No weakness.     Coordination: Coordination normal.  Psychiatric:        Mood and Affect: Mood normal.        Behavior: Behavior normal.        Thought Content: Thought content normal.        Judgment: Judgment normal.     No results found for any visits on 10/16/22.  Assessment & Plan     Problem List Items Addressed This Visit       Cardiovascular and Mediastinum   Hypertension associated with diabetes ([place])    Chronic, stable At goal Goal of <130/<80 Continue Norvasc 10 mg, Losartan 100 mg      Relevant Orders   Comprehensive Metabolic Panel (CMET)     Respiratory   Allergic rhinitis - Primary    Acute exacerbation in s/o excess cerumen Recommend start of zyrtec, PRN guiaf-DM cough syrup, singulair and PRN albuterol to assist RTC if symptoms  continue >7 days       Relevant Medications   albuterol (VENTOLIN HFA) 108 (90 Base) MCG/ACT inhaler   cetirizine (ZYRTEC) 10 MG tablet   guaiFENesin-dextromethorphan (ROBITUSSIN DM) 100-10 MG/5ML syrup   montelukast (SINGULAIR) 10 MG tablet     Endocrine   Hyperlipidemia associated with type 2 diabetes mellitus ([place])  Chronic, previously elevated Repeat LP Last taking lipitor 40 mg LDL goal of <55 given hx of CVA      Relevant Orders   Lipid panel   Type 2 diabetes mellitus with stage 2 chronic kidney disease, without long-term current use of insulin (HCC)    Chronic, previously elevated >8% Repeat A1c Due for urine micro albumin Currently taking glyburide 5 mg and metformin 500 mg BID On statin; not on ace      Relevant Orders   Hemoglobin A1c   Microalbumin / creatinine urine ratio     Nervous and Auditory   Excessive cerumen in both ear canals    Irrigation provided; pt tolerated poorly with complaints of dizziness Stopped irrigation         Other   Avitaminosis D    Chronic, previously controlled with diet and exercise as well as use of MVI Repeat labs; not currently on Vit D3 supplementation       Relevant Orders   Vitamin D (25 hydroxy)   Elevated hemoglobin (HCC)    Chronic, repeat CBC in setting of previous hx of elevated hgb      Relevant Orders   CBC with Differential/Platelet   History of CVA with residual deficit    Chronic, stable Has graduated from walker to cane Altered gait remains Pt denies sick contacts as rarely out of home       Return in about 3 months (around 01/16/2023) for chonic disease management.      Vonna Kotyk, FNP, have reviewed all documentation for this visit. The documentation on 10/16/22 for the exam, diagnosis, procedures, and orders are all accurate and complete.  Gwyneth Sprout, [place] [phone] (phone) [phone] (fax)  [place]

## 2022-10-16 NOTE — Assessment & Plan Note (Signed)
Chronic, repeat CBC in setting of previous hx of elevated hgb

## 2022-10-16 NOTE — Assessment & Plan Note (Signed)
Chronic, stable At goal Goal of <130/<80 Continue Norvasc 10 mg, Losartan 100 mg

## 2022-10-16 NOTE — Assessment & Plan Note (Signed)
Chronic, previously controlled with diet and exercise as well as use of MVI Repeat labs; not currently on Vit D3 supplementation

## 2022-10-16 NOTE — Assessment & Plan Note (Signed)
Chronic, previously elevated Repeat LP Last taking lipitor 40 mg LDL goal of <55 given hx of CVA

## 2022-10-17 ENCOUNTER — Other Ambulatory Visit: Payer: Self-pay | Admitting: Family Medicine

## 2022-10-17 DIAGNOSIS — E1122 Type 2 diabetes mellitus with diabetic chronic kidney disease: Secondary | ICD-10-CM

## 2022-10-17 DIAGNOSIS — E1169 Type 2 diabetes mellitus with other specified complication: Secondary | ICD-10-CM

## 2022-10-17 DIAGNOSIS — E559 Vitamin D deficiency, unspecified: Secondary | ICD-10-CM

## 2022-10-17 LAB — COMPREHENSIVE METABOLIC PANEL
ALT: 8 IU/L (ref 0–32)
AST: 13 IU/L (ref 0–40)
Albumin/Globulin Ratio: 1.4 (ref 1.2–2.2)
Albumin: 4.4 g/dL (ref 3.9–4.9)
Alkaline Phosphatase: 84 IU/L (ref 44–121)
BUN/Creatinine Ratio: 15 (ref 12–28)
BUN: 16 mg/dL (ref 8–27)
Bilirubin Total: 0.4 mg/dL (ref 0.0–1.2)
CO2: 24 mmol/L (ref 20–29)
Calcium: 9.3 mg/dL (ref 8.7–10.3)
Chloride: 96 mmol/L (ref 96–106)
Creatinine, Ser: 1.06 mg/dL — ABNORMAL HIGH (ref 0.57–1.00)
Globulin, Total: 3.1 g/dL (ref 1.5–4.5)
Glucose: 300 mg/dL — ABNORMAL HIGH (ref 70–99)
Potassium: 4.5 mmol/L (ref 3.5–5.2)
Sodium: 136 mmol/L (ref 134–144)
Total Protein: 7.5 g/dL (ref 6.0–8.5)
eGFR: 58 mL/min/{1.73_m2} — ABNORMAL LOW (ref >59)

## 2022-10-17 LAB — CBC WITH DIFFERENTIAL/PLATELET
Basophils Absolute: 0.1 10*3/uL (ref 0.0–0.2)
Basos: 0 %
EOS (ABSOLUTE): 0.2 10*3/uL (ref 0.0–0.4)
Eos: 2 %
Hematocrit: 44.3 % (ref 34.0–46.6)
Hemoglobin: 14.7 g/dL (ref 11.1–15.9)
Immature Grans (Abs): 0 10*3/uL (ref 0.0–0.1)
Immature Granulocytes: 0 %
Lymphocytes Absolute: 2.1 10*3/uL (ref 0.7–3.1)
Lymphs: 18 %
MCH: 28.9 pg (ref 26.6–33.0)
MCHC: 33.2 g/dL (ref 31.5–35.7)
MCV: 87 fL (ref 79–97)
Monocytes Absolute: 0.6 10*3/uL (ref 0.1–0.9)
Monocytes: 5 %
Neutrophils Absolute: 8.7 10*3/uL — ABNORMAL HIGH (ref 1.4–7.0)
Neutrophils: 75 %
Platelets: 239 10*3/uL (ref 150–450)
RBC: 5.08 x10E6/uL (ref 3.77–5.28)
RDW: 12.1 % (ref 11.7–15.4)
WBC: 11.6 10*3/uL — ABNORMAL HIGH (ref 3.4–10.8)

## 2022-10-17 LAB — LIPID PANEL
Chol/HDL Ratio: 4.4 ratio (ref 0.0–4.4)
Cholesterol, Total: 202 mg/dL — ABNORMAL HIGH (ref 100–199)
HDL: 46 mg/dL (ref >39)
LDL Chol Calc (NIH): 124 mg/dL — ABNORMAL HIGH (ref 0–99)
Triglycerides: 181 mg/dL — ABNORMAL HIGH (ref 0–149)
VLDL Cholesterol Cal: 32 mg/dL (ref 5–40)

## 2022-10-17 LAB — HEMOGLOBIN A1C
Est. average glucose Bld gHb Est-mCnc: 252 mg/dL
Hgb A1c MFr Bld: 10.4 % — ABNORMAL HIGH (ref 4.8–5.6)

## 2022-10-17 LAB — MICROALBUMIN / CREATININE URINE RATIO
Creatinine, Urine: 96.7 mg/dL
Microalb/Creat Ratio: 28 mg/g creat (ref 0–29)
Microalbumin, Urine: 26.8 ug/mL

## 2022-10-17 LAB — VITAMIN D 25 HYDROXY (VIT D DEFICIENCY, FRACTURES): Vit D, 25-Hydroxy: 20.5 ng/mL — ABNORMAL LOW (ref 30.0–100.0)

## 2022-10-17 MED ORDER — VITAMIN D (ERGOCALCIFEROL) 1.25 MG (50000 UNIT) PO CAPS
50000.0000 [IU] | ORAL_CAPSULE | ORAL | 0 refills | Status: DC
  Filled 2022-10-17: qty 13, 90d supply, fill #0

## 2022-10-17 MED ORDER — METFORMIN HCL ER 500 MG PO TB24
1000.0000 mg | ORAL_TABLET | Freq: Two times a day (BID) | ORAL | 1 refills | Status: DC
  Filled 2022-10-17: qty 360, 90d supply, fill #0

## 2022-10-17 MED ORDER — EMPAGLIFLOZIN 10 MG PO TABS
10.0000 mg | ORAL_TABLET | Freq: Every day | ORAL | 1 refills | Status: DC
  Filled 2022-10-17: qty 90, 90d supply, fill #0

## 2022-10-17 MED ORDER — GLYBURIDE 5 MG PO TABS
10.0000 mg | ORAL_TABLET | Freq: Two times a day (BID) | ORAL | 1 refills | Status: DC
  Filled 2022-10-17: qty 360, 90d supply, fill #0

## 2022-10-17 MED ORDER — ROSUVASTATIN CALCIUM 40 MG PO TABS
40.0000 mg | ORAL_TABLET | Freq: Every day | ORAL | 1 refills | Status: DC
  Filled 2022-10-17: qty 90, 90d supply, fill #0

## 2022-10-17 NOTE — Progress Notes (Signed)
Urine micro is stable; repeat in 1 year.  Vit D is low; recommend 6 months of high dose supplements to assist. Repeat labs in 6 months; following completion of Rx.  Cholesterol remains elevated; recommend change in statin for best protection against future heart attacks/stroke. LDL goal of 55. Stop Lipitor 40. Change to Crestor 40.   Diabetes has worsened and recommend medication changes to assist. Recommend new start once/day long standing insulin for best treatment, if agreeable to injections. Can also increase glyburide to 10 mg with two meals as well as increase metformin to 1000 mg with two meals. Recommend addition of jardiance to assist.  Cell count is improved; however, slight infection noted. Continue to monitor with symptom resolution.  Kidney function has decreased from 7 months ago. Ensure adequate hydration with water for best assistance, goal of 48-64 oz/day. Recommend follow up in 3 months with repeat labs for kidney function, cell count, and blood sugar average.

## 2022-10-18 ENCOUNTER — Other Ambulatory Visit: Payer: Self-pay

## 2022-10-19 ENCOUNTER — Other Ambulatory Visit: Payer: Self-pay

## 2022-10-20 ENCOUNTER — Ambulatory Visit: Payer: PPO

## 2022-10-20 ENCOUNTER — Other Ambulatory Visit: Payer: Self-pay

## 2022-10-21 ENCOUNTER — Other Ambulatory Visit: Payer: Self-pay | Admitting: Family Medicine

## 2022-10-21 DIAGNOSIS — N182 Chronic kidney disease, stage 2 (mild): Secondary | ICD-10-CM

## 2022-10-22 ENCOUNTER — Ambulatory Visit: Payer: PPO

## 2022-10-27 ENCOUNTER — Ambulatory Visit: Payer: PPO | Attending: Adult Health

## 2022-10-27 DIAGNOSIS — M6281 Muscle weakness (generalized): Secondary | ICD-10-CM | POA: Insufficient documentation

## 2022-10-27 DIAGNOSIS — I6381 Other cerebral infarction due to occlusion or stenosis of small artery: Secondary | ICD-10-CM | POA: Insufficient documentation

## 2022-10-27 DIAGNOSIS — R278 Other lack of coordination: Secondary | ICD-10-CM | POA: Diagnosis not present

## 2022-10-27 NOTE — Therapy (Signed)
OUTPATIENT OCCUPATIONAL THERAPY TREATMENT NOTE    Patient Name: [name] MRN: [id number] DOB:08-26-1956, 66 y.o., female Today's Date: 10/27/2022  PCP: Dr. Birdie Sons REFERRING PROVIDER: Frann Rider, NP   OT End of Session - 10/27/22 1005     Visit Number 27    Number of Visits 70    Date for OT Re-Evaluation 11/10/22    Authorization Time Period Reporting period beginning 08/27/22    OT Start Time 1000    OT Stop Time 1045    OT Time Calculation (min) 45 min    Equipment Utilized During Treatment [facility]    Activity Tolerance Patient tolerated treatment well    Behavior During Therapy [facility] for tasks assessed/performed               Past Medical History:  Diagnosis Date   Anxiety    Arthritis    CAD (coronary artery disease)    a. 3 vessel CAD noted on coronary CTA 04/2021, s/p CABG x4   Cataract    left   Depression    Diabetes mellitus without complication ([place])    type 2   Environmental and seasonal allergies    GERD (gastroesophageal reflux disease)    Hyperlipidemia    Hypertension    Joint pain    as reported by patient   Papillary fibroelastoma of heart    of the aortic valve,  s/p AVR and tumor resection on 08/04/2021   Right thalamic infarction ([place]) 04/29/2021   S/P AVR (aortic valve replacement)    s/p AVR with resection of AV fibroelastoma on 08/04/2021   S/P CABG x 4    a. s/p CABG x4 LIMA to LAD, SVG to PDA, and Sequential SVG to OM and Diagonal on 8/   Stroke ([place]) 04/2021   admitted at [facility] - left sided weakness   Urinary incontinence    as stated by patient   Past Surgical History:  Procedure Laterality Date   AORTIC VALVE REPLACEMENT N/A 08/04/2021   Procedure: RESECTION AORTIC VALVE TUMOR;  Surgeon: [name], MD;  Location: [place];  Service: Open Heart Surgery;  Laterality: N/A;   APPLICATION OF WOUND VAC N/A 08/24/2021   Procedure: APPLICATION OF WOUND VAC;  Surgeon: Gaye Pollack, MD;  Location: [place] OR;   Service: Thoracic;  Laterality: N/A;   APPLICATION OF WOUND VAC N/A 08/28/2021   Procedure: WOUND VAC CHANGE;  Surgeon: [name], MD;  Location: [place];  Service: Thoracic;  Laterality: N/A;   BREAST EXCISIONAL BIOPSY Right 1999   neg   BUBBLE STUDY  04/24/2021   Procedure: BUBBLE STUDY;  Surgeon: Sueanne Margarita, MD;  Location: [place];  Service: Cardiovascular;;   CATARACT EXTRACTION  12/2011   CATARACT EXTRACTION W/PHACO Left 12/02/2016   Procedure: CATARACT EXTRACTION PHACO AND INTRAOCULAR LENS PLACEMENT ([place]);  Surgeon: Estill Cotta, MD;  Location: [facility] ORS;  Service: Ophthalmology;  Laterality: Left;  [place] 2:14 AP% 28.4 CDE 59.73 Fluid pack lot # [id number]   COLONOSCOPY WITH PROPOFOL N/A 04/13/2022   Procedure: COLONOSCOPY WITH PROPOFOL;  Surgeon: Lin Landsman, MD;  Location: [facility] ENDOSCOPY;  Service: Gastroenterology;  Laterality: N/A;   CORONARY ARTERY BYPASS GRAFT N/A 08/04/2021   Procedure: CORONARY ARTERY BYPASS GRAFTING (CABG) X  4  USING LEFT INTERNAL MAMMARY ARTERY AND RIGHT GREATER SAPHENOUS VEIN ENDOSCOPIC CONDUITS;  Surgeon: [name], MD;  Location: [place];  Service: Open Heart Surgery;  Laterality: N/A;   DIAGNOSTIC  LAPAROSCOPY     EYE SURGERY     IR CT HEAD LTD  04/19/2021   IR PERCUTANEOUS ART THROMBECTOMY/INFUSION INTRACRANIAL INC DIAG ANGIO  04/19/2021       IR PERCUTANEOUS ART THROMBECTOMY/INFUSION INTRACRANIAL INC DIAG ANGIO  04/19/2021   IR US GUIDE VASC ACCESS LEFT  04/19/2021   RADIOLOGY WITH ANESTHESIA N/A 04/19/2021   Procedure: IR WITH ANESTHESIA;  Surgeon: Luanne Bras, MD;  Location: [place];  Service: Radiology;  Laterality: N/A;   STERNAL WOUND DEBRIDEMENT N/A 08/24/2021   Procedure: STERNAL WOUND DRAINAGE AND DEBRIDEMENT;  Surgeon: Gaye Pollack, MD;  Location: [place] OR;  Service: Thoracic;  Laterality: N/A;   STERNAL WOUND DEBRIDEMENT N/A 08/28/2021   Procedure: STERNAL WOUND DEBRIDEMENT;  Surgeon: [name], MD;  Location: [place];  Service: Thoracic;  Laterality: N/A;   TEE WITHOUT CARDIOVERSION N/A 04/24/2021   Procedure: TRANSESOPHAGEAL ECHOCARDIOGRAM (TEE);  Surgeon: Sueanne Margarita, MD;  Location: [facility] ENDOSCOPY;  Service: Cardiovascular;  Laterality: N/A;   TEE WITHOUT CARDIOVERSION N/A 08/04/2021   Procedure: TRANSESOPHAGEAL ECHOCARDIOGRAM (TEE);  Surgeon: [name], MD;  Location: [place];  Service: Open Heart Surgery;  Laterality: N/A;   TOTAL HIP ARTHROPLASTY Right 04/04/2018   Procedure: TOTAL HIP ARTHROPLASTY;  Surgeon: Dereck Leep, MD;  Location: [facility] ORS;  Service: Orthopedics;  Laterality: Right;   Patient Active Problem List   Diagnosis Date Noted   Excessive cerumen in both ear canals 10/16/2022   Elevated hemoglobin ([place]) 10/16/2022   Hyperlipidemia associated with type 2 diabetes mellitus ([place]) 10/16/2022   Hypertension associated with diabetes ([place]) 10/16/2022   Type 2 diabetes mellitus with stage 2 chronic kidney disease, without long-term current use of insulin ([place]) 10/16/2022   Colon cancer screening    Subcutaneous nodule    Diabetic peripheral neuropathy ([place])    Debility 08/11/2021   S/P CABG x 4 08/04/2021   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   Hyperkalemia    Chronic bilateral low back pain without sciatica    Leukocytosis    Hyponatremia    Slow transit constipation    Hemiparesis affecting left side as late effect of stroke ([place])    History of CVA with residual deficit 04/19/2021   Dyslipidemia, goal LDL below 70 04/18/2021   Recurrent major depressive disorder, in partial remission ([place]) 09/20/2020   Status post total replacement of hip 04/04/2018   Primary osteoarthritis of right hip 02/20/2018   Allergic rhinitis 10/28/2015   Acute stress disorder 08/01/2015   Anxiety 08/01/2015   Clinical depression 08/01/2015   Diabetes mellitus with coincident hypertension ([place]) 08/01/2015   Diverticulosis of colon 08/01/2015    Generalized pruritus 08/01/2015   Cannot sleep 08/01/2015   Adiposity 08/01/2015   Detrusor muscle hypertonia 08/01/2015   Hypercholesterolemia without hypertriglyceridemia 08/01/2015   Female stress incontinence 08/01/2015   Avitaminosis D 08/01/2015    ONSET DATE: 04/18/2021  REFERRING DIAG: hemiparesis affecting L side as late effect of CVA; dsymetria  THERAPY DIAG:  Muscle weakness (generalized)  Other lack of coordination  Right thalamic infarction [facility])  Rationale for Evaluation and Treatment Rehabilitation  PERTINENT HISTORY: Pt s/p R CVA on 04/18/21. Pt was initially recovering very well from a stroke standpoint with mild LUE >LLE numbness and ambulating with a cane. 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 Therapy eval's recommended CIR  for decreased functional mobility - d/c to CIR on 8/29. She required incision debridement of sternal wound and application of VAC eventually discontinued on 9/29. She was discharged home on 9/30 (after a 32 day stay).  PRECAUTIONS: Fall  SUBJECTIVE: Pt reports some illness with allergies last week, but feeling better today.  PAIN:  Are you having pain? 0 pain   OBJECTIVE:    [facility] OT Assessment - 07/01/22 0001       07/01/22 08/18/22 08/27/22           Observation/Other Assessments      Focus on Therapeutic Outcomes (FOTO)  61  59 NT           Coordination      Gross Motor Movements are Fluid and Coordinated No  No (improving) No (improving)    Fine Motor Movements are Fluid and Coordinated No  No (improving) No (improving)    Right 9 Hole Peg Test 31 sec  NT NT    Left 9 Hole Peg Test 3 min 44 sec   improved from 5 min 38 sec 2 min 50 sec (improved from 3 min 44 sec) 2 min 9 sec            Hand Function      Right Hand Grip (lbs) 35  NT NT    Right Hand Lateral Pinch 10 lbs   NT NT    Right Hand 3 Point Pinch 7 lbs  NT NT    Left Hand Grip (lbs) 10   decline (L LF pain from triggering limiting grip strength) 23# 25#    Left Hand Lateral Pinch 6 lbs  8# 9#    Left 3 point pinch 5 lbs   finger slipping 8# 6# (finger slipping)      TODAY'S TREATMENT:   Moist heat applied to L hand x 5 min for muscle relaxation in prep for neuro re-ed activities.    Neuro re-ed: Facilitated [facility] and dexterity skills working to pick up small pegs from non-skid surface and placed onto pegboard.  Frequent dropping of pegs but improving with pt attempting more normal movement patterns as pt initiated resting forearm on table top during [facility] activities, avoiding a shoulder rise. Connect 4 game played.  Pt used L hand to sort red and black game pieces.  Able to pick up pieces from table top without non-skid surface, extra time, and place into vertical slots of game board.  Pt challenged to place pieces without resting other fingers on top of game board for stability.  Pt continues to present with moderate ataxia.   PATIENT EDUCATION:  Education details: Continue to limit repetitive flexion of L 3rd digit and wear splint with activity to prevent triggering.  Person educated: Patient Education method: Explanation and Verbal cues Education comprehension: verbalized understanding, returned demonstration, and needs further education   HOME EXERCISE PROGRAM FMC/GMC activities for LUE    OT Short Term Goals -        OT SHORT TERM GOAL #1   Title Pt will be indep to perform LUE HEP for coordination and strengthening.    Baseline Eval: not yet initiated; 06/30/22: pt remains consistent with HEP and avoiding repetitive gripping that causes pain in L LF; 08/18/22: pt remains consistent to engage LUE; HEP ongoing as function improves; 08/27/22: same as 08/18/22   Time 6    Period Weeks    Status ongoing   Target Date 09/29/22     OT SHORT TERM  GOAL #2   Title Pt to be indep with joint protection and  pain management strategies to limit triggering of L hand 3rd digit PIP.    Baseline Eval: Trigger finger splint recommended, initiated joint protection educ; 06/30/22: pt is consistent to wear her trigger finger splint and avoids repetitive gripping which causes pain to L LF PIP joint, though pt is still experiencing moderate pain in this joint with occasional triggering.  Pt plans to follow up with MD next week; 08/18/22: pt is indep with joint protection, though she states triggering has worsened since her cortisone injection, yet her pain is better.  Possibility that pt is using her hand more d/t less pain and OT reminded pt to limit repetition with gripping; 08/27/22: same as 08/18/22   Time 6    Period Weeks    Status ongoing   Target Date 09/29/22              OT Long Term Goals -       OT LONG TERM GOAL #1   Title Pt will increase FOTO by 1-2 points to indicate improvement in perceived functional indep with daily tasks.    Baseline Eval: FOTO 60; 06/30/22: 61; 08/18/22: 59; 08/27/22: NT this date   Time 12    Period Weeks    Status ongoing   Target Date 11/10/22     OT LONG TERM GOAL #2   Title Pt will demonstrate increased L FMC/GMC to enable indep with putting hair in ponytail.    Baseline Eval: unable; 06/30/22: pt can hold her hair in a ponytail with BUEs but can not yet manage the rubber band; 08/18/22: pt can tie hair in ponytail   Time 12    Period Weeks    Status Achieved    Target Date 08/10/22      OT LONG TERM GOAL #3   Title Pt will increase L FMC to enable indep with tying shoe laces.    Baseline Eval: unable to tie laces; wears slip on shoes; 06/30/22: pt still wearing slip on shoes; 08/18/22: pt stated she tied her laces the other day, but it took her 4 trials; 08/27/22: same as 08/18/22   Time 12    Period Weeks    Status ongoing   Target Date 11/10/22     OT LONG TERM GOAL #4   Title Pt will increase L hand dexterity skills to enable pt to pick up coins from table with L  hand.    Baseline Eval: unable; 06/30/22: pt can pick up coins from table with extra time.  Dexterity skills improving as noted by improved 9 hole peg test from 5 min 38 sec to 3 min 44 sec on the L; 08/18/22: Pt picks up coins from table with mild-moderate difficulty, L 9 hole 2 min 50 sec; 08/27/22: L 9 hole 2 min 9 secs   Time 12    Period Weeks    Status ongoing   Target Date 11/10/22     OT LONG TERM GOAL #5   Title Pt will increase L grip strength by 5 or more lbs to improve ability to hold and carry ADL supplies in L hand.    Baseline Eval: L grip 17# (R 35#); 06/30/22: L grip 10# (pain in L LF significantly limiting grip as finger continues to trigger; 08/18/22: L grip 23#; 08/27/22: L grip 25#   Time 12    Period Weeks    Status ongoing   Target Date  11/10/22            Plan - 06/02/22 1036     Clinical Impression Statement Moist heat effective for muscle relaxation in prep for neuro re-ed activities.  Pt reports she still can't find her oval 8 splint but that she will look for it, and L LF has not been triggering.  Noting increased isolation of distal and proximal movements today, with pt resting forearm on table top to perform [facility] activities.  Pt continues to present with moderate ataxia in LUE, contributing to difficulty with picking up small items in L hand, and moving them toward a target with good accuracy.  Pt struggles with coordinating a 3 point pinch, as LF tends to slip.  Pt will continue to benefit from skilled OT for pain and edema management in L LF, and increasing LUE strength and coordination skills in order to increase efficiency when engaging LUE into daily tasks.      OT Occupational Profile and History Problem Focused Assessment - Including review of records relating to presenting problem    Occupational performance deficits (Please refer to evaluation for details): ADL's;IADL's;Work    Body Structure / Function / Physical Skills ADL;GMC;UE functional use;FMC;Decreased  knowledge of use of DME;Pain;Strength;Coordination;Dexterity;IADL;Sensation;Balance    Rehab Potential Good    Clinical Decision Making Limited treatment options, no task modification necessary    Comorbidities Affecting Occupational Performance: May have comorbidities impacting occupational performance    Modification or Assistance to Complete Evaluation  No modification of tasks or assist necessary to complete eval    OT Frequency 2x / week    OT Duration 12 weeks    OT Treatment/Interventions Self-care/ADL training;Therapeutic exercise;Therapeutic activities;Cryotherapy;Moist Heat;Neuromuscular education;DME and/or AE instruction;Manual Therapy;Splinting;Patient/family education    Plan OT focus on LUE coordination/strength training and education/splinting for L hand 3rd digit PIP triggering    OT Home Exercise Plan L GMC/FMC tasks, strengthening as tolerated   Consulted and Agree with Plan of Care Patient            Leta Speller, MS, OTR/L   Darleene Cleaver, OT 10/27/2022, 11:23 PM

## 2022-10-29 ENCOUNTER — Ambulatory Visit: Payer: PPO

## 2022-10-29 DIAGNOSIS — R278 Other lack of coordination: Secondary | ICD-10-CM

## 2022-10-29 DIAGNOSIS — I6381 Other cerebral infarction due to occlusion or stenosis of small artery: Secondary | ICD-10-CM

## 2022-10-29 DIAGNOSIS — M6281 Muscle weakness (generalized): Secondary | ICD-10-CM

## 2022-10-31 NOTE — Therapy (Signed)
OUTPATIENT OCCUPATIONAL THERAPY TREATMENT NOTE    Patient Name: [name] MRN: [id number] DOB:[birth year], 66 y.o., female Today's Date: 10/31/2022  PCP: Dr. Birdie Sons REFERRING PROVIDER: Frann Rider, NP   OT End of Session - 10/31/22 1912     Visit Number 28    Number of Visits 73    Date for OT Re-Evaluation 11/10/22    Authorization Time Period Reporting period beginning 08/27/22    OT Start Time 1015    OT Stop Time 1100    OT Time Calculation (min) 45 min    Equipment Utilized During Treatment [facility]    Activity Tolerance Patient tolerated treatment well    Behavior During Therapy [facility] for tasks assessed/performed               Past Medical History:  Diagnosis Date   Anxiety    Arthritis    CAD (coronary artery disease)    a. 3 vessel CAD noted on coronary CTA 04/2021, s/p CABG x4   Cataract    left   Depression    Diabetes mellitus without complication ([place])    type 2   Environmental and seasonal allergies    GERD (gastroesophageal reflux disease)    Hyperlipidemia    Hypertension    Joint pain    as reported by patient   Papillary fibroelastoma of heart    of the aortic valve,  s/p AVR and tumor resection on 08/04/2021   Right thalamic infarction ([place]) 04/29/2021   S/P AVR (aortic valve replacement)    s/p AVR with resection of AV fibroelastoma on 08/04/2021   S/P CABG x 4    a. s/p CABG x4 LIMA to LAD, SVG to PDA, and Sequential SVG to OM and Diagonal on 8/   Stroke ([place]) 04/2021   admitted at [facility] - left sided weakness   Urinary incontinence    as stated by patient   Past Surgical History:  Procedure Laterality Date   AORTIC VALVE REPLACEMENT N/A 08/04/2021   Procedure: RESECTION AORTIC VALVE TUMOR;  Surgeon: [name], MD;  Location: [place];  Service: Open Heart Surgery;  Laterality: N/A;   APPLICATION OF WOUND VAC N/A 08/24/2021   Procedure: APPLICATION OF WOUND VAC;  Surgeon: Gaye Pollack, MD;  Location: [place] OR;   Service: Thoracic;  Laterality: N/A;   APPLICATION OF WOUND VAC N/A 08/28/2021   Procedure: WOUND VAC CHANGE;  Surgeon: [name], MD;  Location: [place];  Service: Thoracic;  Laterality: N/A;   BREAST EXCISIONAL BIOPSY Right 1999   neg   BUBBLE STUDY  04/24/2021   Procedure: BUBBLE STUDY;  Surgeon: Sueanne Margarita, MD;  Location: [place];  Service: Cardiovascular;;   CATARACT EXTRACTION  12/2011   CATARACT EXTRACTION W/PHACO Left 12/02/2016   Procedure: CATARACT EXTRACTION PHACO AND INTRAOCULAR LENS PLACEMENT ([place]);  Surgeon: Estill Cotta, MD;  Location: [facility] ORS;  Service: Ophthalmology;  Laterality: Left;  [place] 2:14 AP% 28.4 CDE 59.73 Fluid pack lot # [id number]   COLONOSCOPY WITH PROPOFOL N/A 04/13/2022   Procedure: COLONOSCOPY WITH PROPOFOL;  Surgeon: Lin Landsman, MD;  Location: [facility] ENDOSCOPY;  Service: Gastroenterology;  Laterality: N/A;   CORONARY ARTERY BYPASS GRAFT N/A 08/04/2021   Procedure: CORONARY ARTERY BYPASS GRAFTING (CABG) X  4  USING LEFT INTERNAL MAMMARY ARTERY AND RIGHT GREATER SAPHENOUS VEIN ENDOSCOPIC CONDUITS;  Surgeon: [name], MD;  Location: [place];  Service: Open Heart Surgery;  Laterality: N/A;   DIAGNOSTIC  LAPAROSCOPY     EYE SURGERY     IR CT HEAD LTD  04/19/2021   IR PERCUTANEOUS ART THROMBECTOMY/INFUSION INTRACRANIAL INC DIAG ANGIO  04/19/2021       IR PERCUTANEOUS ART THROMBECTOMY/INFUSION INTRACRANIAL INC DIAG ANGIO  04/19/2021   IR US GUIDE VASC ACCESS LEFT  04/19/2021   RADIOLOGY WITH ANESTHESIA N/A 04/19/2021   Procedure: IR WITH ANESTHESIA;  Surgeon: Luanne Bras, MD;  Location: [place];  Service: Radiology;  Laterality: N/A;   STERNAL WOUND DEBRIDEMENT N/A 08/24/2021   Procedure: STERNAL WOUND DRAINAGE AND DEBRIDEMENT;  Surgeon: Gaye Pollack, MD;  Location: [place] OR;  Service: Thoracic;  Laterality: N/A;   STERNAL WOUND DEBRIDEMENT N/A 08/28/2021   Procedure: STERNAL WOUND DEBRIDEMENT;  Surgeon: [name], MD;  Location: [place];  Service: Thoracic;  Laterality: N/A;   TEE WITHOUT CARDIOVERSION N/A 04/24/2021   Procedure: TRANSESOPHAGEAL ECHOCARDIOGRAM (TEE);  Surgeon: Sueanne Margarita, MD;  Location: [facility] ENDOSCOPY;  Service: Cardiovascular;  Laterality: N/A;   TEE WITHOUT CARDIOVERSION N/A 08/04/2021   Procedure: TRANSESOPHAGEAL ECHOCARDIOGRAM (TEE);  Surgeon: [name], MD;  Location: [place];  Service: Open Heart Surgery;  Laterality: N/A;   TOTAL HIP ARTHROPLASTY Right 04/04/2018   Procedure: TOTAL HIP ARTHROPLASTY;  Surgeon: Dereck Leep, MD;  Location: [facility] ORS;  Service: Orthopedics;  Laterality: Right;   Patient Active Problem List   Diagnosis Date Noted   Excessive cerumen in both ear canals 10/16/2022   Elevated hemoglobin ([place]) 10/16/2022   Hyperlipidemia associated with type 2 diabetes mellitus ([place]) 10/16/2022   Hypertension associated with diabetes ([place]) 10/16/2022   Type 2 diabetes mellitus with stage 2 chronic kidney disease, without long-term current use of insulin ([place]) 10/16/2022   Colon cancer screening    Subcutaneous nodule    Diabetic peripheral neuropathy ([place])    Debility 08/11/2021   S/P CABG x 4 08/04/2021   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   Hyperkalemia    Chronic bilateral low back pain without sciatica    Leukocytosis    Hyponatremia    Slow transit constipation    Hemiparesis affecting left side as late effect of stroke ([place])    History of CVA with residual deficit 04/19/2021   Dyslipidemia, goal LDL below 70 04/18/2021   Recurrent major depressive disorder, in partial remission ([place]) 09/20/2020   Status post total replacement of hip 04/04/2018   Primary osteoarthritis of right hip 02/20/2018   Allergic rhinitis 10/28/2015   Acute stress disorder 08/01/2015   Anxiety 08/01/2015   Clinical depression 08/01/2015   Diabetes mellitus with coincident hypertension ([place]) 08/01/2015   Diverticulosis of colon 08/01/2015    Generalized pruritus 08/01/2015   Cannot sleep 08/01/2015   Adiposity 08/01/2015   Detrusor muscle hypertonia 08/01/2015   Hypercholesterolemia without hypertriglyceridemia 08/01/2015   Female stress incontinence 08/01/2015   Avitaminosis D 08/01/2015    ONSET DATE: 04/18/2021  REFERRING DIAG: hemiparesis affecting L side as late effect of CVA; dsymetria  THERAPY DIAG:  Muscle weakness (generalized)  Other lack of coordination  Right thalamic infarction [facility])  Rationale for Evaluation and Treatment Rehabilitation  PERTINENT HISTORY: Pt s/p R CVA on 04/18/21. Pt was initially recovering very well from a stroke standpoint with mild LUE >LLE numbness and ambulating with a cane. 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 Therapy eval's recommended CIR  for decreased functional mobility - d/c to CIR on 8/29. She required incision debridement of sternal wound and application of VAC eventually discontinued on 9/29. She was discharged home on 9/30 (after a 32 day stay).  PRECAUTIONS: Fall  SUBJECTIVE: Pt arrived with grandson today.    PAIN:  Are you having pain? 0 pain   OBJECTIVE:    [facility] OT Assessment - 07/01/22 0001       07/01/22 08/18/22 08/27/22           Observation/Other Assessments      Focus on Therapeutic Outcomes (FOTO)  61  59 NT           Coordination      Gross Motor Movements are Fluid and Coordinated No  No (improving) No (improving)    Fine Motor Movements are Fluid and Coordinated No  No (improving) No (improving)    Right 9 Hole Peg Test 31 sec  NT NT    Left 9 Hole Peg Test 3 min 44 sec   improved from 5 min 38 sec 2 min 50 sec (improved from 3 min 44 sec) 2 min 9 sec            Hand Function      Right Hand Grip (lbs) 35  NT NT    Right Hand Lateral Pinch 10 lbs  NT NT    Right Hand 3 Point Pinch 7 lbs   NT NT    Left Hand Grip (lbs) 10   decline (L LF pain from triggering limiting grip strength) 23# 25#    Left Hand Lateral Pinch 6 lbs  8# 9#    Left 3 point pinch 5 lbs   finger slipping 8# 6# (finger slipping)      TODAY'S TREATMENT:   Moist heat applied to L hand x 5 min for muscle relaxation in prep for therapeutic exercises and neuro re-ed activities.    Therapeutic Exercise: Facilitated L hand pinching strengthening with light-moderate resistance clips (yellow, red, green) to target lateral and 3 point pinching patterns on the L hand.  Pt clipped pins onto vertical dowel, then practiced quick, alternating movements (pron/supv) to place and remove clips from a horizontal dowel.    Neuro re-ed: Facilitated L [facility] and dexterity skills working with [place] discs.  Practiced timed trials to place 20 discs into grid, fastest speed being 1 min 11 sec.  Practiced removing discs, flipping, twisting, and then added GMC component with stacking discs (up to 3).    PATIENT EDUCATION:  Education details: Continue to limit repetitive flexion of L 3rd digit and wear splint with activity to prevent triggering.  Person educated: Patient Education method: Explanation and Verbal cues Education comprehension: verbalized understanding, returned demonstration, and needs further education   HOME EXERCISE PROGRAM FMC/GMC activities for LUE    OT Short Term Goals -        OT SHORT TERM GOAL #1   Title Pt will be indep to perform LUE HEP for coordination and strengthening.    Baseline Eval: not yet initiated; 06/30/22: pt remains consistent with HEP and avoiding repetitive gripping that causes pain in L LF; 08/18/22: pt remains consistent to engage LUE; HEP ongoing as function improves; 08/27/22: same as 08/18/22   Time 6    Period Weeks    Status ongoing   Target Date 09/29/22     OT SHORT TERM GOAL #2   Title Pt to be indep with joint protection and pain management strategies  to limit triggering of L  hand 3rd digit PIP.    Baseline Eval: Trigger finger splint recommended, initiated joint protection educ; 06/30/22: pt is consistent to wear her trigger finger splint and avoids repetitive gripping which causes pain to L LF PIP joint, though pt is still experiencing moderate pain in this joint with occasional triggering.  Pt plans to follow up with MD next week; 08/18/22: pt is indep with joint protection, though she states triggering has worsened since her cortisone injection, yet her pain is better.  Possibility that pt is using her hand more d/t less pain and OT reminded pt to limit repetition with gripping; 08/27/22: same as 08/18/22   Time 6    Period Weeks    Status ongoing   Target Date 09/29/22              OT Long Term Goals -       OT LONG TERM GOAL #1   Title Pt will increase FOTO by 1-2 points to indicate improvement in perceived functional indep with daily tasks.    Baseline Eval: FOTO 60; 06/30/22: 61; 08/18/22: 59; 08/27/22: NT this date   Time 12    Period Weeks    Status ongoing   Target Date 11/10/22     OT LONG TERM GOAL #2   Title Pt will demonstrate increased L FMC/GMC to enable indep with putting hair in ponytail.    Baseline Eval: unable; 06/30/22: pt can hold her hair in a ponytail with BUEs but can not yet manage the rubber band; 08/18/22: pt can tie hair in ponytail   Time 12    Period Weeks    Status Achieved    Target Date 08/10/22      OT LONG TERM GOAL #3   Title Pt will increase L FMC to enable indep with tying shoe laces.    Baseline Eval: unable to tie laces; wears slip on shoes; 06/30/22: pt still wearing slip on shoes; 08/18/22: pt stated she tied her laces the other day, but it took her 4 trials; 08/27/22: same as 08/18/22   Time 12    Period Weeks    Status ongoing   Target Date 11/10/22     OT LONG TERM GOAL #4   Title Pt will increase L hand dexterity skills to enable pt to pick up coins from table with L hand.    Baseline Eval: unable; 06/30/22: pt can  pick up coins from table with extra time.  Dexterity skills improving as noted by improved 9 hole peg test from 5 min 38 sec to 3 min 44 sec on the L; 08/18/22: Pt picks up coins from table with mild-moderate difficulty, L 9 hole 2 min 50 sec; 08/27/22: L 9 hole 2 min 9 secs   Time 12    Period Weeks    Status ongoing   Target Date 11/10/22     OT LONG TERM GOAL #5   Title Pt will increase L grip strength by 5 or more lbs to improve ability to hold and carry ADL supplies in L hand.    Baseline Eval: L grip 17# (R 35#); 06/30/22: L grip 10# (pain in L LF significantly limiting grip as finger continues to trigger; 08/18/22: L grip 23#; 08/27/22: L grip 25#   Time 12    Period Weeks    Status ongoing   Target Date 11/10/22            Plan - 06/02/22 1036  Clinical Impression Statement Moist heat effective for muscle relaxation in prep for neuro re-ed activities.  Pt continues to increase isolation of distal and proximal movements today, with pt resting forearm on table top to perform [facility] activities.  Pt continues to present with moderate ataxia in LUE, contributing to difficulty with picking up small items in L hand, and moving them toward a target with good accuracy.  Pt struggles with coordinating a 3 point pinch, as LF tends to slip.  Pt will continue to benefit from skilled OT for pain and edema management in L LF, and increasing LUE strength and coordination skills in order to increase efficiency when engaging LUE into daily tasks.      OT Occupational Profile and History Problem Focused Assessment - Including review of records relating to presenting problem    Occupational performance deficits (Please refer to evaluation for details): ADL's;IADL's;Work    Body Structure / Function / Physical Skills ADL;GMC;UE functional use;FMC;Decreased knowledge of use of DME;Pain;Strength;Coordination;Dexterity;IADL;Sensation;Balance    Rehab Potential Good    Clinical Decision Making Limited treatment  options, no task modification necessary    Comorbidities Affecting Occupational Performance: May have comorbidities impacting occupational performance    Modification or Assistance to Complete Evaluation  No modification of tasks or assist necessary to complete eval    OT Frequency 2x / week    OT Duration 12 weeks    OT Treatment/Interventions Self-care/ADL training;Therapeutic exercise;Therapeutic activities;Cryotherapy;Moist Heat;Neuromuscular education;DME and/or AE instruction;Manual Therapy;Splinting;Patient/family education    Plan OT focus on LUE coordination/strength training and education/splinting for L hand 3rd digit PIP triggering    OT Home Exercise Plan L GMC/FMC tasks, strengthening as tolerated   Consulted and Agree with Plan of Care Patient            Leta Speller, MS, OTR/L   Darleene Cleaver, OT 10/31/2022, 7:13 PM

## 2022-11-03 ENCOUNTER — Telehealth: Payer: Self-pay | Admitting: Family Medicine

## 2022-11-03 ENCOUNTER — Ambulatory Visit: Payer: PPO

## 2022-11-03 DIAGNOSIS — M6281 Muscle weakness (generalized): Secondary | ICD-10-CM | POA: Diagnosis not present

## 2022-11-03 DIAGNOSIS — I6381 Other cerebral infarction due to occlusion or stenosis of small artery: Secondary | ICD-10-CM

## 2022-11-03 DIAGNOSIS — R278 Other lack of coordination: Secondary | ICD-10-CM

## 2022-11-03 NOTE — Telephone Encounter (Signed)
I called and spoke with patient. She states she did not get the Jardiance prescription filled because it was over $100 and she can't afford it. She says she called back up to the office to let us know that, and the medication was changed to glyburide. Patient has been taking Glyburide and tolerating it well. Would you still like patient to  schedule a follow up in 3-4 weeks?

## 2022-11-03 NOTE — Telephone Encounter (Signed)
Patient was prescribed Jardiance earlier this month by Daneil Dan. Please see if she has been able to get this filled and if any problems with the medications. If she is tolerating the medication then she needs to schedule a follow up in 3-4 weeks to check on sugar and electrolytes.

## 2022-11-04 NOTE — Telephone Encounter (Signed)
Called patient to let her know to schedule f/u in January to check A1C. Unable to leave VM Okay for PEC to advise.

## 2022-11-04 NOTE — Telephone Encounter (Signed)
Ok, in that case she should schedule follow up in [facility] January to check the A1c.

## 2022-11-06 NOTE — Therapy (Signed)
OUTPATIENT OCCUPATIONAL THERAPY TREATMENT NOTE    Patient Name: [name] MRN: [id number] DOB:1956-04-10, 66 y.o., female Today's Date: 11/06/2022  PCP: Dr. Birdie Sons REFERRING PROVIDER: Frann Rider, NP   OT End of Session - 11/06/22 1211     Visit Number 29    Number of Visits 42    Date for OT Re-Evaluation 11/10/22    Authorization Time Period Reporting period beginning 08/27/22    OT Start Time 1000    OT Stop Time 1045    OT Time Calculation (min) 45 min    Equipment Utilized During Treatment [facility]    Activity Tolerance Patient tolerated treatment well    Behavior During Therapy [facility] for tasks assessed/performed               Past Medical History:  Diagnosis Date   Anxiety    Arthritis    CAD (coronary artery disease)    a. 3 vessel CAD noted on coronary CTA 04/2021, s/p CABG x4   Cataract    left   Depression    Diabetes mellitus without complication ([place])    type 2   Environmental and seasonal allergies    GERD (gastroesophageal reflux disease)    Hyperlipidemia    Hypertension    Joint pain    as reported by patient   Papillary fibroelastoma of heart    of the aortic valve,  s/p AVR and tumor resection on 08/04/2021   Right thalamic infarction ([place]) 04/29/2021   S/P AVR (aortic valve replacement)    s/p AVR with resection of AV fibroelastoma on 08/04/2021   S/P CABG x 4    a. s/p CABG x4 LIMA to LAD, SVG to PDA, and Sequential SVG to OM and Diagonal on 8/   Stroke ([place]) 04/2021   admitted at [facility] - left sided weakness   Urinary incontinence    as stated by patient   Past Surgical History:  Procedure Laterality Date   AORTIC VALVE REPLACEMENT N/A 08/04/2021   Procedure: RESECTION AORTIC VALVE TUMOR;  Surgeon: [name], MD;  Location: [place];  Service: Open Heart Surgery;  Laterality: N/A;   APPLICATION OF WOUND VAC N/A 08/24/2021   Procedure: APPLICATION OF WOUND VAC;  Surgeon: Gaye Pollack, MD;  Location: [place] OR;   Service: Thoracic;  Laterality: N/A;   APPLICATION OF WOUND VAC N/A 08/28/2021   Procedure: WOUND VAC CHANGE;  Surgeon: [name], MD;  Location: [place];  Service: Thoracic;  Laterality: N/A;   BREAST EXCISIONAL BIOPSY Right 1999   neg   BUBBLE STUDY  04/24/2021   Procedure: BUBBLE STUDY;  Surgeon: Sueanne Margarita, MD;  Location: [place];  Service: Cardiovascular;;   CATARACT EXTRACTION  12/2011   CATARACT EXTRACTION W/PHACO Left 12/02/2016   Procedure: CATARACT EXTRACTION PHACO AND INTRAOCULAR LENS PLACEMENT ([place]);  Surgeon: Estill Cotta, MD;  Location: [facility]S;  Service: Ophthalmology;  Laterality: Left;  [place] 2:14 AP% 28.4 CDE 59.73 Fluid pack lot # [id number]   COLONOSCOPY WITH PROPOFOL N/A 04/13/2022   Procedure: COLONOSCOPY WITH PROPOFOL;  Surgeon: Lin Landsman, MD;  Location: [facility] ENDOSCOPY;  Service: Gastroenterology;  Laterality: N/A;   CORONARY ARTERY BYPASS GRAFT N/A 08/04/2021   Procedure: CORONARY ARTERY BYPASS GRAFTING (CABG) X  4  USING LEFT INTERNAL MAMMARY ARTERY AND RIGHT GREATER SAPHENOUS VEIN ENDOSCOPIC CONDUITS;  Surgeon: [name], MD;  Location: [place];  Service: Open Heart Surgery;  Laterality: N/A;   DIAGNOSTIC  LAPAROSCOPY     EYE SURGERY     IR CT HEAD LTD  04/19/2021   IR PERCUTANEOUS ART THROMBECTOMY/INFUSION INTRACRANIAL INC DIAG ANGIO  04/19/2021       IR PERCUTANEOUS ART THROMBECTOMY/INFUSION INTRACRANIAL INC DIAG ANGIO  04/19/2021   IR US GUIDE VASC ACCESS LEFT  04/19/2021   RADIOLOGY WITH ANESTHESIA N/A 04/19/2021   Procedure: IR WITH ANESTHESIA;  Surgeon: Luanne Bras, MD;  Location: [place];  Service: Radiology;  Laterality: N/A;   STERNAL WOUND DEBRIDEMENT N/A 08/24/2021   Procedure: STERNAL WOUND DRAINAGE AND DEBRIDEMENT;  Surgeon: Gaye Pollack, MD;  Location: [place] OR;  Service: Thoracic;  Laterality: N/A;   STERNAL WOUND DEBRIDEMENT N/A 08/28/2021   Procedure: STERNAL WOUND DEBRIDEMENT;  Surgeon: [name], MD;  Location: [place];  Service: Thoracic;  Laterality: N/A;   TEE WITHOUT CARDIOVERSION N/A 04/24/2021   Procedure: TRANSESOPHAGEAL ECHOCARDIOGRAM (TEE);  Surgeon: Sueanne Margarita, MD;  Location: [facility] ENDOSCOPY;  Service: Cardiovascular;  Laterality: N/A;   TEE WITHOUT CARDIOVERSION N/A 08/04/2021   Procedure: TRANSESOPHAGEAL ECHOCARDIOGRAM (TEE);  Surgeon: [name], MD;  Location: [place];  Service: Open Heart Surgery;  Laterality: N/A;   TOTAL HIP ARTHROPLASTY Right 04/04/2018   Procedure: TOTAL HIP ARTHROPLASTY;  Surgeon: Dereck Leep, MD;  Location: [facility] ORS;  Service: Orthopedics;  Laterality: Right;   Patient Active Problem List   Diagnosis Date Noted   Excessive cerumen in both ear canals 10/16/2022   Elevated hemoglobin ([place]) 10/16/2022   Hyperlipidemia associated with type 2 diabetes mellitus ([place]) 10/16/2022   Hypertension associated with diabetes ([place]) 10/16/2022   Type 2 diabetes mellitus with stage 2 chronic kidney disease, without long-term current use of insulin ([place]) 10/16/2022   Colon cancer screening    Subcutaneous nodule    Diabetic peripheral neuropathy ([place])    Debility 08/11/2021   S/P CABG x 4 08/04/2021   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   Hyperkalemia    Chronic bilateral low back pain without sciatica    Leukocytosis    Hyponatremia    Slow transit constipation    Hemiparesis affecting left side as late effect of stroke ([place])    History of CVA with residual deficit 04/19/2021   Dyslipidemia, goal LDL below 70 04/18/2021   Recurrent major depressive disorder, in partial remission ([place]) 09/20/2020   Status post total replacement of hip 04/04/2018   Primary osteoarthritis of right hip 02/20/2018   Allergic rhinitis 10/28/2015   Acute stress disorder 08/01/2015   Anxiety 08/01/2015   Clinical depression 08/01/2015   Diabetes mellitus with coincident hypertension ([place]) 08/01/2015   Diverticulosis of colon 08/01/2015    Generalized pruritus 08/01/2015   Cannot sleep 08/01/2015   Adiposity 08/01/2015   Detrusor muscle hypertonia 08/01/2015   Hypercholesterolemia without hypertriglyceridemia 08/01/2015   Female stress incontinence 08/01/2015   Avitaminosis D 08/01/2015    ONSET DATE: 04/18/2021  REFERRING DIAG: hemiparesis affecting L side as late effect of CVA; dsymetria  THERAPY DIAG:  Muscle weakness (generalized)  Other lack of coordination  Right thalamic infarction [facility])  Rationale for Evaluation and Treatment Rehabilitation  PERTINENT HISTORY: Pt s/p R CVA on 04/18/21. Pt was initially recovering very well from a stroke standpoint with mild LUE >LLE numbness and ambulating with a cane. 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 Therapy eval's recommended CIR  for decreased functional mobility - d/c to CIR on 8/29. She required incision debridement of sternal wound and application of VAC eventually discontinued on 9/29. She was discharged home on 9/30 (after a 32 day stay).  PRECAUTIONS: Fall  SUBJECTIVE: Pt reports doing well today.  PAIN:  Are you having pain? 0 pain   OBJECTIVE:    [facility] OT Assessment - 07/01/22 0001       07/01/22 08/18/22 08/27/22           Observation/Other Assessments      Focus on Therapeutic Outcomes (FOTO)  61  59 NT           Coordination      Gross Motor Movements are Fluid and Coordinated No  No (improving) No (improving)    Fine Motor Movements are Fluid and Coordinated No  No (improving) No (improving)    Right 9 Hole Peg Test 31 sec  NT NT    Left 9 Hole Peg Test 3 min 44 sec   improved from 5 min 38 sec 2 min 50 sec (improved from 3 min 44 sec) 2 min 9 sec            Hand Function      Right Hand Grip (lbs) 35  NT NT    Right Hand Lateral Pinch 10 lbs  NT NT    Right Hand 3 Point Pinch 7 lbs  NT NT     Left Hand Grip (lbs) 10   decline (L LF pain from triggering limiting grip strength) 23# 25#    Left Hand Lateral Pinch 6 lbs  8# 9#    Left 3 point pinch 5 lbs   finger slipping 8# 6# (finger slipping)      TODAY'S TREATMENT:   Moist heat applied to L hand for muscle relaxation intermittently applied during rest breaks from therapeutic exercises and neuro re-ed activities.    Therapeutic Exercise: Facilitated L hand pinching strengthening with light-moderate resistance clips (yellow, red, green) to target lateral and 3 point pinching patterns on the L hand.  Pt clipped pins onto vertical dowel, then practiced quick, alternating movements (pron/supv) to place and remove clips from a horizontal dowel.    Neuro re-ed: Facilitated L FMC and dexterity skills working to pick up washers from resistive/magnetic dish, storing in palm, and translatory skills to move washers from palm to fingertips in prep for discarding.  Facilitated LUE [place] skills and [facility], working to pick up Connect 4 chips and placing them into game board.    PATIENT EDUCATION:  Education details: Continue to limit repetitive flexion of L 3rd digit and wear splint with activity to prevent triggering.  Person educated: Patient Education method: Explanation and Verbal cues Education comprehension: verbalized understanding, returned demonstration, and needs further education   HOME EXERCISE PROGRAM FMC/GMC activities for LUE    OT Short Term Goals -        OT SHORT TERM GOAL #1   Title Pt will be indep to perform LUE HEP for coordination and strengthening.    Baseline Eval: not yet initiated; 06/30/22: pt remains consistent with HEP and avoiding repetitive gripping that causes pain in L LF; 08/18/22: pt remains consistent to engage LUE; HEP ongoing as function improves; 08/27/22: same as 08/18/22   Time 6    Period Weeks    Status ongoing   Target Date 09/29/22     OT SHORT TERM GOAL #2   Title Pt to be indep with joint  protection and pain management strategies to limit triggering of L hand 3rd digit PIP.    Baseline Eval: Trigger finger splint recommended, initiated joint protection educ; 06/30/22: pt is consistent to wear her trigger finger splint and avoids repetitive gripping which causes pain to L LF PIP joint, though pt is still experiencing moderate pain in this joint with occasional triggering.  Pt plans to follow up with MD next week; 08/18/22: pt is indep with joint protection, though she states triggering has worsened since her cortisone injection, yet her pain is better.  Possibility that pt is using her hand more d/t less pain and OT reminded pt to limit repetition with gripping; 08/27/22: same as 08/18/22   Time 6    Period Weeks    Status ongoing   Target Date 09/29/22              OT Long Term Goals -       OT LONG TERM GOAL #1   Title Pt will increase FOTO by 1-2 points to indicate improvement in perceived functional indep with daily tasks.    Baseline Eval: FOTO 60; 06/30/22: 61; 08/18/22: 59; 08/27/22: NT this date   Time 12    Period Weeks    Status ongoing   Target Date 11/10/22     OT LONG TERM GOAL #2   Title Pt will demonstrate increased L FMC/GMC to enable indep with putting hair in ponytail.    Baseline Eval: unable; 06/30/22: pt can hold her hair in a ponytail with BUEs but can not yet manage the rubber band; 08/18/22: pt can tie hair in ponytail   Time 12    Period Weeks    Status Achieved    Target Date 08/10/22      OT LONG TERM GOAL #3   Title Pt will increase L FMC to enable indep with tying shoe laces.    Baseline Eval: unable to tie laces; wears slip on shoes; 06/30/22: pt still wearing slip on shoes; 08/18/22: pt stated she tied her laces the other day, but it took her 4 trials; 08/27/22: same as 08/18/22   Time 12    Period Weeks    Status ongoing   Target Date 11/10/22     OT LONG TERM GOAL #4   Title Pt will increase L hand dexterity skills to enable pt to pick up coins  from table with L hand.    Baseline Eval: unable; 06/30/22: pt can pick up coins from table with extra time.  Dexterity skills improving as noted by improved 9 hole peg test from 5 min 38 sec to 3 min 44 sec on the L; 08/18/22: Pt picks up coins from table with mild-moderate difficulty, L 9 hole 2 min 50 sec; 08/27/22: L 9 hole 2 min 9 secs   Time 12    Period Weeks    Status ongoing   Target Date 11/10/22     OT LONG TERM GOAL #5   Title Pt will increase L grip strength by 5 or more lbs to improve ability to hold and carry ADL supplies in L hand.    Baseline Eval: L grip 17# (R 35#); 06/30/22: L grip 10# (pain in L LF significantly limiting grip as finger continues to trigger; 08/18/22: L grip 23#; 08/27/22: L grip 25#   Time 12    Period Weeks    Status ongoing   Target Date 11/10/22  Plan - 06/02/22 1036     Clinical Impression Statement Moist heat effective for muscle relaxation in prep for neuro re-ed activities.  Pt continues to increase isolation of distal and proximal movements today, with pt resting forearm on table top to perform [facility] activities.  Pt continues to present with moderate ataxia in LUE, contributing to difficulty with picking up small items in L hand, and moving them toward a target with good accuracy.  Pt tolerated all activities well this date without L LF triggering.  Pt will continue to benefit from skilled OT for pain and edema management in L LF, and increasing LUE strength and coordination skills in order to increase efficiency when engaging LUE into daily tasks.      OT Occupational Profile and History Problem Focused Assessment - Including review of records relating to presenting problem    Occupational performance deficits (Please refer to evaluation for details): ADL's;IADL's;Work    Body Structure / Function / Physical Skills ADL;GMC;UE functional use;FMC;Decreased knowledge of use of DME;Pain;Strength;Coordination;Dexterity;IADL;Sensation;Balance     Rehab Potential Good    Clinical Decision Making Limited treatment options, no task modification necessary    Comorbidities Affecting Occupational Performance: May have comorbidities impacting occupational performance    Modification or Assistance to Complete Evaluation  No modification of tasks or assist necessary to complete eval    OT Frequency 2x / week    OT Duration 12 weeks    OT Treatment/Interventions Self-care/ADL training;Therapeutic exercise;Therapeutic activities;Cryotherapy;Moist Heat;Neuromuscular education;DME and/or AE instruction;Manual Therapy;Splinting;Patient/family education    Plan OT focus on LUE coordination/strength training and education/splinting for L hand 3rd digit PIP triggering    OT Home Exercise Plan L GMC/FMC tasks, strengthening as tolerated   Consulted and Agree with Plan of Care Patient            Leta Speller, MS, OTR/L   Darleene Cleaver, OT 11/06/2022, 12:12 PM

## 2022-11-10 ENCOUNTER — Ambulatory Visit: Payer: PPO

## 2022-11-10 ENCOUNTER — Other Ambulatory Visit: Payer: Self-pay

## 2022-11-11 ENCOUNTER — Other Ambulatory Visit: Payer: Self-pay

## 2022-11-12 ENCOUNTER — Ambulatory Visit: Payer: PPO

## 2022-11-12 DIAGNOSIS — I6381 Other cerebral infarction due to occlusion or stenosis of small artery: Secondary | ICD-10-CM

## 2022-11-12 DIAGNOSIS — R278 Other lack of coordination: Secondary | ICD-10-CM

## 2022-11-12 DIAGNOSIS — M6281 Muscle weakness (generalized): Secondary | ICD-10-CM

## 2022-11-12 DIAGNOSIS — H40153 Residual stage of open-angle glaucoma, bilateral: Secondary | ICD-10-CM | POA: Diagnosis not present

## 2022-11-12 NOTE — Therapy (Addendum)
OUTPATIENT OCCUPATIONAL THERAPY PROGRESS/RECERTIFICATION AND TREATMENT NOTE Reporting period beginning 08/27/22-11/12/22    Patient Name: [name] MRN: [id number] DOB:11-Feb-1956, 66 y.o., female Today's Date: 11/12/2022  PCP: Dr. Birdie Sons REFERRING PROVIDER: Frann Rider, NP   OT End of Session - 11/12/22 2153     Visit Number 30    Number of Visits 65   Date for OT Re-Evaluation 02/04/23    Authorization Time Period Reporting period beginning 08/27/22-11/12/22    OT Start Time 1145    OT Stop Time 81    OT Time Calculation (min) 45 min    Equipment Utilized During Treatment [facility]    Activity Tolerance Patient tolerated treatment well    Behavior During Therapy [facility] for tasks assessed/performed             Past Medical History:  Diagnosis Date   Anxiety    Arthritis    CAD (coronary artery disease)    a. 3 vessel CAD noted on coronary CTA 04/2021, s/p CABG x4   Cataract    left   Depression    Diabetes mellitus without complication ([place])    type 2   Environmental and seasonal allergies    GERD (gastroesophageal reflux disease)    Hyperlipidemia    Hypertension    Joint pain    as reported by patient   Papillary fibroelastoma of heart    of the aortic valve,  s/p AVR and tumor resection on 08/04/2021   Right thalamic infarction ([place]) 04/29/2021   S/P AVR (aortic valve replacement)    s/p AVR with resection of AV fibroelastoma on 08/04/2021   S/P CABG x 4    a. s/p CABG x4 LIMA to LAD, SVG to PDA, and Sequential SVG to OM and Diagonal on 8/   Stroke ([place]) 04/2021   admitted at [facility] - left sided weakness   Urinary incontinence    as stated by patient   Past Surgical History:  Procedure Laterality Date   AORTIC VALVE REPLACEMENT N/A 08/04/2021   Procedure: RESECTION AORTIC VALVE TUMOR;  Surgeon: [name], MD;  Location: [place];  Service: Open Heart Surgery;  Laterality: N/A;   APPLICATION OF WOUND VAC N/A 08/24/2021    Procedure: APPLICATION OF WOUND VAC;  Surgeon: Gaye Pollack, MD;  Location: [place] OR;  Service: Thoracic;  Laterality: N/A;   APPLICATION OF WOUND VAC N/A 08/28/2021   Procedure: WOUND VAC CHANGE;  Surgeon: [name], MD;  Location: [place];  Service: Thoracic;  Laterality: N/A;   BREAST EXCISIONAL BIOPSY Right 1999   neg   BUBBLE STUDY  04/24/2021   Procedure: BUBBLE STUDY;  Surgeon: Sueanne Margarita, MD;  Location: [place];  Service: Cardiovascular;;   CATARACT EXTRACTION  12/2011   CATARACT EXTRACTION W/PHACO Left 12/02/2016   Procedure: CATARACT EXTRACTION PHACO AND INTRAOCULAR LENS PLACEMENT ([place]);  Surgeon: Estill Cotta, MD;  Location: [facility]S;  Service: Ophthalmology;  Laterality: Left;  [place] 2:14 AP% 28.4 CDE 59.73 Fluid pack lot # [id number]   COLONOSCOPY WITH PROPOFOL N/A 04/13/2022   Procedure: COLONOSCOPY WITH PROPOFOL;  Surgeon: Lin Landsman, MD;  Location: [facility] ENDOSCOPY;  Service: Gastroenterology;  Laterality: N/A;   CORONARY ARTERY BYPASS GRAFT N/A 08/04/2021   Procedure: CORONARY ARTERY BYPASS GRAFTING (CABG) X  4  USING LEFT INTERNAL MAMMARY ARTERY AND RIGHT GREATER SAPHENOUS VEIN ENDOSCOPIC CONDUITS;  Surgeon: [name], MD;  Location: [place];  Service: Open Heart Surgery;  Laterality: N/A;  DIAGNOSTIC LAPAROSCOPY     EYE SURGERY     IR CT HEAD LTD  04/19/2021   IR PERCUTANEOUS ART THROMBECTOMY/INFUSION INTRACRANIAL INC DIAG ANGIO  04/19/2021       IR PERCUTANEOUS ART THROMBECTOMY/INFUSION INTRACRANIAL INC DIAG ANGIO  04/19/2021   IR US GUIDE VASC ACCESS LEFT  04/19/2021   RADIOLOGY WITH ANESTHESIA N/A 04/19/2021   Procedure: IR WITH ANESTHESIA;  Surgeon: Luanne Bras, MD;  Location: [place];  Service: Radiology;  Laterality: N/A;   STERNAL WOUND DEBRIDEMENT N/A 08/24/2021   Procedure: STERNAL WOUND DRAINAGE AND DEBRIDEMENT;  Surgeon: Gaye Pollack, MD;  Location: [place] OR;  Service: Thoracic;  Laterality: N/A;   STERNAL WOUND  DEBRIDEMENT N/A 08/28/2021   Procedure: STERNAL WOUND DEBRIDEMENT;  Surgeon: [name], MD;  Location: [place];  Service: Thoracic;  Laterality: N/A;   TEE WITHOUT CARDIOVERSION N/A 04/24/2021   Procedure: TRANSESOPHAGEAL ECHOCARDIOGRAM (TEE);  Surgeon: Sueanne Margarita, MD;  Location: [facility] ENDOSCOPY;  Service: Cardiovascular;  Laterality: N/A;   TEE WITHOUT CARDIOVERSION N/A 08/04/2021   Procedure: TRANSESOPHAGEAL ECHOCARDIOGRAM (TEE);  Surgeon: [name], MD;  Location: [place];  Service: Open Heart Surgery;  Laterality: N/A;   TOTAL HIP ARTHROPLASTY Right 04/04/2018   Procedure: TOTAL HIP ARTHROPLASTY;  Surgeon: Dereck Leep, MD;  Location: [facility] ORS;  Service: Orthopedics;  Laterality: Right;   Patient Active Problem List   Diagnosis Date Noted   Excessive cerumen in both ear canals 10/16/2022   Elevated hemoglobin ([place]) 10/16/2022   Hyperlipidemia associated with type 2 diabetes mellitus ([place]) 10/16/2022   Hypertension associated with diabetes ([place]) 10/16/2022   Type 2 diabetes mellitus with stage 2 chronic kidney disease, without long-term current use of insulin ([place]) 10/16/2022   Colon cancer screening    Subcutaneous nodule    Diabetic peripheral neuropathy ([place])    Debility 08/11/2021   S/P CABG x 4 08/04/2021   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   Hyperkalemia    Chronic bilateral low back pain without sciatica    Leukocytosis    Hyponatremia    Slow transit constipation    Hemiparesis affecting left side as late effect of stroke ([place])    History of CVA with residual deficit 04/19/2021   Dyslipidemia, goal LDL below 70 04/18/2021   Recurrent major depressive disorder, in partial remission ([place]) 09/20/2020   Status post total replacement of hip 04/04/2018   Primary osteoarthritis of right hip 02/20/2018   Allergic rhinitis 10/28/2015   Acute stress disorder 08/01/2015   Anxiety 08/01/2015   Clinical depression 08/01/2015   Diabetes mellitus  with coincident hypertension ([place]) 08/01/2015   Diverticulosis of colon 08/01/2015   Generalized pruritus 08/01/2015   Cannot sleep 08/01/2015   Adiposity 08/01/2015   Detrusor muscle hypertonia 08/01/2015   Hypercholesterolemia without hypertriglyceridemia 08/01/2015   Female stress incontinence 08/01/2015   Avitaminosis D 08/01/2015    ONSET DATE: 04/18/2021  REFERRING DIAG: hemiparesis affecting L side as late effect of CVA; dsymetria  THERAPY DIAG:  Muscle weakness (generalized)  Other lack of coordination  Right thalamic infarction [facility])  Rationale for Evaluation and Treatment Rehabilitation  PERTINENT HISTORY: Pt s/p R CVA on 04/18/21. Pt was initially recovering very well from a stroke standpoint with mild LUE >LLE numbness and ambulating with a cane. 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 Therapy eval's recommended  CIR for decreased functional mobility - d/c to CIR on 8/29. She required incision debridement of sternal wound and application of VAC eventually discontinued on 9/29. She was discharged home on 9/30 (after a 32 day stay).  PRECAUTIONS: Fall  SUBJECTIVE: Pt reports her grandson is doing well.  Pt had to miss a visit earlier in the week d/t grandson being ill.    PAIN:  Are you having pain? 0 pain   OBJECTIVE:    [facility] OT Assessment - 07/01/22 0001       07/01/22 08/18/22 08/27/22 11/12/22            Observation/Other Assessments       Focus on Therapeutic Outcomes (FOTO)  61  59 NT 54 (score did not convey improvement, but pt acknowledges using LUE better and more consistently with ADLs)            Coordination       Gross Motor Movements are Fluid and Coordinated No  No (improving) No (improving) No (improving)    Fine Motor Movements are Fluid and Coordinated No  No (improving) No (improving) No (not  improved from last assessment); pt has had several missed appointments since 08/27/22 d/t grandson being in the hospital.    Right 9 Hole Peg Test 31 sec  NT NT NT    Left 9 Hole Peg Test 3 min 44 sec   improved from 5 min 38 sec 2 min 50 sec (improved from 3 min 44 sec) 2 min 9 sec  2 min 16 sec            Hand Function       Right Hand Grip (lbs) 35  NT NT     Right Hand Lateral Pinch 10 lbs  NT NT     Right Hand 3 Point Pinch 7 lbs  NT NT     Left Hand Grip (lbs) 10   decline (L LF pain from triggering limiting grip strength) 23# 25# 29#    Left Hand Lateral Pinch 6 lbs  8# 9# 10#    Left 3 point pinch 5 lbs   finger slipping 8# 6# (finger slipping) 8# (finger slipping)        TODAY'S TREATMENT:   Moist heat applied to L hand for muscle relaxation intermittently applied during rest breaks from therapeutic exercises and neuro re-ed activities.    Therapeutic Exercise: Objective measures taken and goals updated for progress note/recertification.     Neuro re-ed: Facilitated L FMC, 3 point pinch, and [employer] skills working to pick up ball pegs and place pegs into pegboard placed on an incline.  Practiced maintaining 3 point pinch turning ball pegs within pegboard to simulate turning a knob.  Pt required min vc for 3 point pinch pattern with frequent slipping of middle finger from position.    PATIENT EDUCATION:  Education details: Continue to limit repetitive flexion of L 3rd digit and wear splint with activity to prevent triggering.  Person educated: Patient Education method: Explanation and Verbal cues Education comprehension: verbalized understanding, returned demonstration, and needs further education   HOME EXERCISE PROGRAM FMC/GMC activities for LUE    OT Short Term Goals -        OT SHORT TERM GOAL #1   Title Pt will be indep to perform LUE HEP for coordination and strengthening.    Baseline Eval: not yet initiated; 06/30/22: pt remains consistent with HEP and avoiding repetitive  gripping that causes pain in L LF;  08/18/22: pt remains consistent to engage LUE; HEP ongoing as function improves; 08/27/22: same as 08/18/22; 11/12/22: indep; pt regularly engages L hand into daily tasks and knows to maintain low reps with putty to prevent triggering of L LF PIP joint.    Time 6    Period Weeks    Status achieved   Target Date 09/29/22     OT SHORT TERM GOAL #2   Title Pt to be indep with joint protection and pain management strategies to limit triggering of L hand 3rd digit PIP.    Baseline Eval: Trigger finger splint recommended, initiated joint protection educ; 06/30/22: pt is consistent to wear her trigger finger splint and avoids repetitive gripping which causes pain to L LF PIP joint, though pt is still experiencing moderate pain in this joint with occasional triggering.  Pt plans to follow up with MD next week; 08/18/22: pt is indep with joint protection, though she states triggering has worsened since her cortisone injection, yet her pain is better.  Possibility that pt is using her hand more d/t less pain and OT reminded pt to limit repetition with gripping; 08/27/22: same as 08/18/22; 11/12/22: pt is indep with joint protection and states L LF has not triggered lately   Time 6    Period Weeks    Status achieved   Target Date 09/29/22              OT Long Term Goals -  02/04/23      OT LONG TERM GOAL #1   Title Pt will increase FOTO by 1-2 points to indicate improvement in perceived functional indep with daily tasks.    Baseline Eval: FOTO 60; 06/30/22: 61; 08/18/22: 59; 08/27/22: NT this date; 11/12/22: 54   Time 12    Period Weeks    Status ongoing   Target Date 02/04/23     OT LONG TERM GOAL #2   Title Pt will demonstrate increased L FMC/GMC to enable indep with putting hair in ponytail.    Baseline Eval: unable; 06/30/22: pt can hold her hair in a ponytail with BUEs but can not yet manage the rubber band; 08/18/22: pt can tie hair in ponytail   Time 12    Period Weeks     Status Achieved    Target Date 08/10/22      OT LONG TERM GOAL #3   Title Pt will increase L FMC to enable indep with tying shoe laces.    Baseline Eval: unable to tie laces; wears slip on shoes; 06/30/22: pt still wearing slip on shoes; 08/18/22: pt stated she tied her laces the other day, but it took her 4 trials; 08/27/22: same as 08/18/22; 11/12/22: requires multiple trials    Time 12    Period Weeks    Status ongoing   Target Date 02/04/23     OT LONG TERM GOAL #4   Title Pt will increase L hand dexterity skills to enable pt to pick up coins from table with L hand.    Baseline Eval: unable; 06/30/22: pt can pick up coins from table with extra time.  Dexterity skills improving as noted by improved 9 hole peg test from 5 min 38 sec to 3 min 44 sec on the L; 08/18/22: Pt picks up coins from table with mild-moderate difficulty, L 9 hole 2 min 50 sec; 08/27/22: L 9 hole 2 min 9 secs; 11/12/22: 2 min 16 sec (mild-mod difficulty with picking up coins from a table)  Time 12    Period Weeks    Status ongoing   Target Date 02/04/23     OT LONG TERM GOAL #5   Title Pt will increase L grip strength by 5 or more lbs to improve ability to hold and carry ADL supplies in L hand.    Baseline Eval: L grip 17# (R 35#); 06/30/22: L grip 10# (pain in L LF significantly limiting grip as finger continues to trigger; 08/18/22: L grip 23#; 08/27/22: L grip 25#; 02/04/23: L grip 29#   Time 12    Period Weeks    Status ongoing   Target Date 02/04/23            Plan - 06/02/22 1036     Clinical Impression Statement Pt seen for recertification and progress update this date.  Minimal gains this period, though pt acknowledges increased use of LUE with daily tasks, despite decline in FOTO score.  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  L grip strength has improved and pt reports that her L LF PIP has not been triggering.  Pt has oval 8  splint for this finger, though she has worn it inconsistently d/t frequently misplacing it when she sleeps.  Pt continues to present with moderate ataxia with gross and fine motor activities.  Pt struggles with L LF slipping when attempting to maintain a 3 point pinch pattern, which makes it challenging to use L hand to twist open a small cap.  Pt continues to work towards improving ability to pick up small objects from the table and reaching to accurately place those objects toward a target.  Pt frequently drops items when using translatory movements to reposition objects within her hand.  Pt requests to decrease OT visits to 1x per week in the new cert period, beginning in 2 weeks d/t pt needing to be available for her grandkids.  Pt will continue to benefit from skilled OT for increasing LUE strength and maximizing coordination skills in order to increase efficiency when engaging LUE into daily tasks.      OT Occupational Profile and History Problem Focused Assessment - Including review of records relating to presenting problem    Occupational performance deficits (Please refer to evaluation for details): ADL's;IADL's;Work    Body Structure / Function / Physical Skills ADL;GMC;UE functional use;FMC;Decreased knowledge of use of DME;Pain;Strength;Coordination;Dexterity;IADL;Sensation;Balance    Rehab Potential Good    Clinical Decision Making Limited treatment options, no task modification necessary    Comorbidities Affecting Occupational Performance: May have comorbidities impacting occupational performance    Modification or Assistance to Complete Evaluation  No modification of tasks or assist necessary to complete eval    OT Frequency 1-2x / week    OT Duration 12 weeks    OT Treatment/Interventions Self-care/ADL training;Therapeutic exercise;Therapeutic activities;Cryotherapy;Moist Heat;Neuromuscular education;DME and/or AE instruction;Manual Therapy;Splinting;Patient/family education    Plan OT  focus on LUE coordination/strength training and education/splinting for L hand 3rd digit PIP triggering    OT Home Exercise Plan L GMC/FMC tasks, strengthening as tolerated   Consulted and Agree with Plan of Care Patient            Leta Speller, MS, OTR/L   Darleene Cleaver, OT 11/12/2022, 9:55 PM

## 2022-11-14 NOTE — Addendum Note (Signed)
Addended by: Darleene Cleaver on: 11/14/2022 06:34 PM   Modules accepted: Orders

## 2022-11-16 ENCOUNTER — Other Ambulatory Visit: Payer: Self-pay

## 2022-11-17 ENCOUNTER — Ambulatory Visit: Payer: PPO | Attending: Adult Health

## 2022-11-17 ENCOUNTER — Ambulatory Visit: Payer: PPO | Admitting: Physical Therapy

## 2022-11-17 DIAGNOSIS — M6281 Muscle weakness (generalized): Secondary | ICD-10-CM | POA: Diagnosis not present

## 2022-11-17 DIAGNOSIS — R278 Other lack of coordination: Secondary | ICD-10-CM | POA: Diagnosis not present

## 2022-11-17 DIAGNOSIS — I6381 Other cerebral infarction due to occlusion or stenosis of small artery: Secondary | ICD-10-CM | POA: Diagnosis not present

## 2022-11-18 NOTE — Therapy (Signed)
OUTPATIENT OCCUPATIONAL THERAPY NEURO TREATMENT NOTE    Patient Name: [name] MRN: [id number] DOB:1956/03/10, 66 y.o., female Today's Date: 11/18/2022  PCP: Dr. Birdie Sons REFERRING PROVIDER: Frann Rider, NP   OT End of Session - 11/18/22 0818     Visit Number 31    Number of Visits 49    Date for OT Re-Evaluation 02/04/23    Authorization Time Period Reporting period beginning 11/12/22    OT Start Time 1645    OT Stop Time 1730    OT Time Calculation (min) 45 min    Equipment Utilized During Treatment [facility]    Activity Tolerance Patient tolerated treatment well    Behavior During Therapy [facility] for tasks assessed/performed               Past Medical History:  Diagnosis Date   Anxiety    Arthritis    CAD (coronary artery disease)    a. 3 vessel CAD noted on coronary CTA 04/2021, s/p CABG x4   Cataract    left   Depression    Diabetes mellitus without complication ([place])    type 2   Environmental and seasonal allergies    GERD (gastroesophageal reflux disease)    Hyperlipidemia    Hypertension    Joint pain    as reported by patient   Papillary fibroelastoma of heart    of the aortic valve,  s/p AVR and tumor resection on 08/04/2021   Right thalamic infarction ([place]) 04/29/2021   S/P AVR (aortic valve replacement)    s/p AVR with resection of AV fibroelastoma on 08/04/2021   S/P CABG x 4    a. s/p CABG x4 LIMA to LAD, SVG to PDA, and Sequential SVG to OM and Diagonal on 8/   Stroke ([place]) 04/2021   admitted at [facility] - left sided weakness   Urinary incontinence    as stated by patient   Past Surgical History:  Procedure Laterality Date   AORTIC VALVE REPLACEMENT N/A 08/04/2021   Procedure: RESECTION AORTIC VALVE TUMOR;  Surgeon: [name], MD;  Location: [place];  Service: Open Heart Surgery;  Laterality: N/A;   APPLICATION OF WOUND VAC N/A 08/24/2021   Procedure: APPLICATION OF WOUND VAC;  Surgeon: Gaye Pollack, MD;  Location: [place]  OR;  Service: Thoracic;  Laterality: N/A;   APPLICATION OF WOUND VAC N/A 08/28/2021   Procedure: WOUND VAC CHANGE;  Surgeon: [name], MD;  Location: [place];  Service: Thoracic;  Laterality: N/A;   BREAST EXCISIONAL BIOPSY Right 1999   neg   BUBBLE STUDY  04/24/2021   Procedure: BUBBLE STUDY;  Surgeon: Sueanne Margarita, MD;  Location: [place];  Service: Cardiovascular;;   CATARACT EXTRACTION  12/2011   CATARACT EXTRACTION W/PHACO Left 12/02/2016   Procedure: CATARACT EXTRACTION PHACO AND INTRAOCULAR LENS PLACEMENT ([place]);  Surgeon: Estill Cotta, MD;  Location: [facility] ORS;  Service: Ophthalmology;  Laterality: Left;  [place] 2:14 AP% 28.4 CDE 59.73 Fluid pack lot # [id number]   COLONOSCOPY WITH PROPOFOL N/A 04/13/2022   Procedure: COLONOSCOPY WITH PROPOFOL;  Surgeon: Lin Landsman, MD;  Location: [facility] ENDOSCOPY;  Service: Gastroenterology;  Laterality: N/A;   CORONARY ARTERY BYPASS GRAFT N/A 08/04/2021   Procedure: CORONARY ARTERY BYPASS GRAFTING (CABG) X  4  USING LEFT INTERNAL MAMMARY ARTERY AND RIGHT GREATER SAPHENOUS VEIN ENDOSCOPIC CONDUITS;  Surgeon: [name], MD;  Location: [place];  Service: Open Heart Surgery;  Laterality: N/A;  DIAGNOSTIC LAPAROSCOPY     EYE SURGERY     IR CT HEAD LTD  04/19/2021   IR PERCUTANEOUS ART THROMBECTOMY/INFUSION INTRACRANIAL INC DIAG ANGIO  04/19/2021       IR PERCUTANEOUS ART THROMBECTOMY/INFUSION INTRACRANIAL INC DIAG ANGIO  04/19/2021   IR US GUIDE VASC ACCESS LEFT  04/19/2021   RADIOLOGY WITH ANESTHESIA N/A 04/19/2021   Procedure: IR WITH ANESTHESIA;  Surgeon: Luanne Bras, MD;  Location: [place];  Service: Radiology;  Laterality: N/A;   STERNAL WOUND DEBRIDEMENT N/A 08/24/2021   Procedure: STERNAL WOUND DRAINAGE AND DEBRIDEMENT;  Surgeon: Gaye Pollack, MD;  Location: [place] OR;  Service: Thoracic;  Laterality: N/A;   STERNAL WOUND DEBRIDEMENT N/A 08/28/2021   Procedure: STERNAL WOUND DEBRIDEMENT;  Surgeon:  [name], MD;  Location: [place];  Service: Thoracic;  Laterality: N/A;   TEE WITHOUT CARDIOVERSION N/A 04/24/2021   Procedure: TRANSESOPHAGEAL ECHOCARDIOGRAM (TEE);  Surgeon: Sueanne Margarita, MD;  Location: [facility] ENDOSCOPY;  Service: Cardiovascular;  Laterality: N/A;   TEE WITHOUT CARDIOVERSION N/A 08/04/2021   Procedure: TRANSESOPHAGEAL ECHOCARDIOGRAM (TEE);  Surgeon: [name], MD;  Location: [place];  Service: Open Heart Surgery;  Laterality: N/A;   TOTAL HIP ARTHROPLASTY Right 04/04/2018   Procedure: TOTAL HIP ARTHROPLASTY;  Surgeon: Dereck Leep, MD;  Location: [facility] ORS;  Service: Orthopedics;  Laterality: Right;   Patient Active Problem List   Diagnosis Date Noted   Excessive cerumen in both ear canals 10/16/2022   Elevated hemoglobin ([place]) 10/16/2022   Hyperlipidemia associated with type 2 diabetes mellitus ([place]) 10/16/2022   Hypertension associated with diabetes ([place]) 10/16/2022   Type 2 diabetes mellitus with stage 2 chronic kidney disease, without long-term current use of insulin ([place]) 10/16/2022   Colon cancer screening    Subcutaneous nodule    Diabetic peripheral neuropathy ([place])    Debility 08/11/2021   S/P CABG x 4 08/04/2021   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   Hyperkalemia    Chronic bilateral low back pain without sciatica    Leukocytosis    Hyponatremia    Slow transit constipation    Hemiparesis affecting left side as late effect of stroke ([place])    History of CVA with residual deficit 04/19/2021   Dyslipidemia, goal LDL below 70 04/18/2021   Recurrent major depressive disorder, in partial remission ([place]) 09/20/2020   Status post total replacement of hip 04/04/2018   Primary osteoarthritis of right hip 02/20/2018   Allergic rhinitis 10/28/2015   Acute stress disorder 08/01/2015   Anxiety 08/01/2015   Clinical depression 08/01/2015   Diabetes mellitus with coincident hypertension ([place]) 08/01/2015   Diverticulosis of colon  08/01/2015   Generalized pruritus 08/01/2015   Cannot sleep 08/01/2015   Adiposity 08/01/2015   Detrusor muscle hypertonia 08/01/2015   Hypercholesterolemia without hypertriglyceridemia 08/01/2015   Female stress incontinence 08/01/2015   Avitaminosis D 08/01/2015    ONSET DATE: 04/18/2021  REFERRING DIAG: hemiparesis affecting L side as late effect of CVA; dsymetria  THERAPY DIAG:  Muscle weakness (generalized)  Other lack of coordination  Right thalamic infarction [facility])  Rationale for Evaluation and Treatment Rehabilitation  PERTINENT HISTORY: Pt s/p R CVA on 04/18/21. Pt was initially recovering very well from a stroke standpoint with mild LUE >LLE numbness and ambulating with a cane. 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 Therapy eval's recommended  CIR for decreased functional mobility - d/c to CIR on 8/29. She required incision debridement of sternal wound and application of VAC eventually discontinued on 9/29. She was discharged home on 9/30 (after a 32 day stay).  PRECAUTIONS: Fall  SUBJECTIVE: Pt reports doing well today.  PAIN:  Are you having pain? 0 pain   OBJECTIVE:    [facility] OT Assessment - 07/01/22 0001       07/01/22 08/18/22 08/27/22 11/12/22            Observation/Other Assessments       Focus on Therapeutic Outcomes (FOTO)  61  59 NT 54 (score did not convey improvement, but pt acknowledges using LUE better and more consistently with ADLs)            Coordination       Gross Motor Movements are Fluid and Coordinated No  No (improving) No (improving) No (improving)    Fine Motor Movements are Fluid and Coordinated No  No (improving) No (improving) No (not improved from last assessment); pt has had several missed appointments since 08/27/22 d/t grandson being in the hospital.    Right 9 Hole Peg Test 31  sec  NT NT NT    Left 9 Hole Peg Test 3 min 44 sec   improved from 5 min 38 sec 2 min 50 sec (improved from 3 min 44 sec) 2 min 9 sec  2 min 16 sec            Hand Function       Right Hand Grip (lbs) 35  NT NT     Right Hand Lateral Pinch 10 lbs  NT NT     Right Hand 3 Point Pinch 7 lbs  NT NT     Left Hand Grip (lbs) 10   decline (L LF pain from triggering limiting grip strength) 23# 25# 29#    Left Hand Lateral Pinch 6 lbs  8# 9# 10#    Left 3 point pinch 5 lbs   finger slipping 8# 6# (finger slipping) 8# (finger slipping)        TODAY'S TREATMENT:   Moist heat applied to L hand for muscle relaxation intermittently applied during rest breaks from neuro re-ed activities.       Neuro re-ed: Facilitated L FMC and [place] activities working to pick up disc shaped magnets from table top and place at a target on table top white board (board stood vertical on table top).  Pt required cues to avoid stabilizing elbow on table top to further challenge [place] when reaching.  Further facilitated same skills with Connect Four game.  Pt used L hand to sort red and black chips, with practice picking up chips 1 at a time with L hand, storing up to 2 in hand, and discarding into slot on game board which required reaching at shoulder level.  Pt required vc to avoid resting fingers on top of game board when discarding chip into slot (a means to stabilize) in order to further challenge pt's accuracy with reaching toward a target.  PATIENT EDUCATION:  Education details: Continue to limit repetitive flexion of L 3rd digit and wear splint with activity to prevent triggering.  Person educated: Patient Education method: Explanation and Verbal cues Education comprehension: verbalized understanding, returned demonstration, and needs further education   HOME EXERCISE PROGRAM FMC/GMC activities for LUE    OT Short Term Goals -        OT SHORT TERM GOAL #1  Title Pt will be indep to perform LUE HEP for coordination  and strengthening.    Baseline Eval: not yet initiated; 06/30/22: pt remains consistent with HEP and avoiding repetitive gripping that causes pain in L LF; 08/18/22: pt remains consistent to engage LUE; HEP ongoing as function improves; 08/27/22: same as 08/18/22; 11/12/22: indep; pt regularly engages L hand into daily tasks and knows to maintain low reps with putty to prevent triggering of L LF PIP joint.    Time 6    Period Weeks    Status achieved   Target Date 09/29/22     OT SHORT TERM GOAL #2   Title Pt to be indep with joint protection and pain management strategies to limit triggering of L hand 3rd digit PIP.    Baseline Eval: Trigger finger splint recommended, initiated joint protection educ; 06/30/22: pt is consistent to wear her trigger finger splint and avoids repetitive gripping which causes pain to L LF PIP joint, though pt is still experiencing moderate pain in this joint with occasional triggering.  Pt plans to follow up with MD next week; 08/18/22: pt is indep with joint protection, though she states triggering has worsened since her cortisone injection, yet her pain is better.  Possibility that pt is using her hand more d/t less pain and OT reminded pt to limit repetition with gripping; 08/27/22: same as 08/18/22; 11/12/22: pt is indep with joint protection and states L LF has not triggered lately   Time 6    Period Weeks    Status achieved   Target Date 09/29/22              OT Long Term Goals -  02/04/23      OT LONG TERM GOAL #1   Title Pt will increase FOTO by 1-2 points to indicate improvement in perceived functional indep with daily tasks.    Baseline Eval: FOTO 60; 06/30/22: 61; 08/18/22: 59; 08/27/22: NT this date; 11/12/22: 54   Time 12    Period Weeks    Status ongoing   Target Date 02/04/23     OT LONG TERM GOAL #2   Title Pt will demonstrate increased L FMC/GMC to enable indep with putting hair in ponytail.    Baseline Eval: unable; 06/30/22: pt can hold her hair in a  ponytail with BUEs but can not yet manage the rubber band; 08/18/22: pt can tie hair in ponytail   Time 12    Period Weeks    Status Achieved    Target Date 08/10/22      OT LONG TERM GOAL #3   Title Pt will increase L FMC to enable indep with tying shoe laces.    Baseline Eval: unable to tie laces; wears slip on shoes; 06/30/22: pt still wearing slip on shoes; 08/18/22: pt stated she tied her laces the other day, but it took her 4 trials; 08/27/22: same as 08/18/22; 11/12/22: requires multiple trials    Time 12    Period Weeks    Status ongoing   Target Date 02/04/23     OT LONG TERM GOAL #4   Title Pt will increase L hand dexterity skills to enable pt to pick up coins from table with L hand.    Baseline Eval: unable; 06/30/22: pt can pick up coins from table with extra time.  Dexterity skills improving as noted by improved 9 hole peg test from 5 min 38 sec to 3 min 44 sec on the L; 08/18/22: Pt  picks up coins from table with mild-moderate difficulty, L 9 hole 2 min 50 sec; 08/27/22: L 9 hole 2 min 9 secs; 11/12/22: 2 min 16 sec (mild-mod difficulty with picking up coins from a table)   Time 12    Period Weeks    Status ongoing   Target Date 02/04/23     OT LONG TERM GOAL #5   Title Pt will increase L grip strength by 5 or more lbs to improve ability to hold and carry ADL supplies in L hand.    Baseline Eval: L grip 17# (R 35#); 06/30/22: L grip 10# (pain in L LF significantly limiting grip as finger continues to trigger; 08/18/22: L grip 23#; 08/27/22: L grip 25#; 02/04/23: L grip 29#   Time 12    Period Weeks    Status ongoing   Target Date 02/04/23            Plan - 06/02/22 1036     Clinical Impression Statement Pt continues to work towards improving ability to pick up small objects from the table and reaching to accurately place those objects toward a target.  Pt is improving with ability to pick up flat objects from table top (ie Connect 4 chips and disc shaped magnets), but pieces still tend  to slide around, requiring multiple attempts at successful pick up.  Pt practiced storing multiple magnets in hand and translatory skills to move pieces from palm to fingertips with only occasional dropping.  Pt consistently is able to place pieces at a target with fair accuracy, but ataxia is more pronounced when elbow is fully extended as pt reaches further away from body.  Pt will continue to benefit from skilled OT for increasing LUE strength and maximizing coordination skills in order to increase efficiency when engaging LUE into daily tasks.      OT Occupational Profile and History Problem Focused Assessment - Including review of records relating to presenting problem    Occupational performance deficits (Please refer to evaluation for details): ADL's;IADL's;Work    Body Structure / Function / Physical Skills ADL;GMC;UE functional use;FMC;Decreased knowledge of use of DME;Pain;Strength;Coordination;Dexterity;IADL;Sensation;Balance    Rehab Potential Good    Clinical Decision Making Limited treatment options, no task modification necessary    Comorbidities Affecting Occupational Performance: May have comorbidities impacting occupational performance    Modification or Assistance to Complete Evaluation  No modification of tasks or assist necessary to complete eval    OT Frequency 1-2x / week    OT Duration 12 weeks    OT Treatment/Interventions Self-care/ADL training;Therapeutic exercise;Therapeutic activities;Cryotherapy;Moist Heat;Neuromuscular education;DME and/or AE instruction;Manual Therapy;Splinting;Patient/family education    Plan OT focus on LUE coordination/strength training and education/splinting for L hand 3rd digit PIP triggering    OT Home Exercise Plan L GMC/FMC tasks, strengthening as tolerated   Consulted and Agree with Plan of Care Patient            Leta Speller, MS, OTR/L   Darleene Cleaver, OT 11/18/2022, 8:21 AM

## 2022-11-19 ENCOUNTER — Ambulatory Visit: Payer: PPO

## 2022-11-19 DIAGNOSIS — R278 Other lack of coordination: Secondary | ICD-10-CM

## 2022-11-19 DIAGNOSIS — M6281 Muscle weakness (generalized): Secondary | ICD-10-CM | POA: Diagnosis not present

## 2022-11-19 DIAGNOSIS — I6381 Other cerebral infarction due to occlusion or stenosis of small artery: Secondary | ICD-10-CM

## 2022-11-22 NOTE — Therapy (Signed)
OUTPATIENT OCCUPATIONAL THERAPY NEURO TREATMENT NOTE    Patient Name: [name] MRN: [id number] DOB:1956/02/02, 66 y.o., female Today's Date: 11/22/2022  PCP: Dr. Birdie Sons REFERRING PROVIDER: Frann Rider, NP   OT End of Session - 11/22/22 1904     Visit Number 32    Number of Visits 69    Date for OT Re-Evaluation 02/04/23    Authorization Time Period Reporting period beginning 11/12/22    OT Start Time 1645    OT Stop Time 1730    OT Time Calculation (min) 45 min    Equipment Utilized During Treatment [facility]    Activity Tolerance Patient tolerated treatment well    Behavior During Therapy [facility] for tasks assessed/performed               Past Medical History:  Diagnosis Date   Anxiety    Arthritis    CAD (coronary artery disease)    a. 3 vessel CAD noted on coronary CTA 04/2021, s/p CABG x4   Cataract    left   Depression    Diabetes mellitus without complication ([place])    type 2   Environmental and seasonal allergies    GERD (gastroesophageal reflux disease)    Hyperlipidemia    Hypertension    Joint pain    as reported by patient   Papillary fibroelastoma of heart    of the aortic valve,  s/p AVR and tumor resection on 08/04/2021   Right thalamic infarction ([place]) 04/29/2021   S/P AVR (aortic valve replacement)    s/p AVR with resection of AV fibroelastoma on 08/04/2021   S/P CABG x 4    a. s/p CABG x4 LIMA to LAD, SVG to PDA, and Sequential SVG to OM and Diagonal on 8/   Stroke ([place]) 04/2021   admitted at [facility] - left sided weakness   Urinary incontinence    as stated by patient   Past Surgical History:  Procedure Laterality Date   AORTIC VALVE REPLACEMENT N/A 08/04/2021   Procedure: RESECTION AORTIC VALVE TUMOR;  Surgeon: [name], MD;  Location: [place];  Service: Open Heart Surgery;  Laterality: N/A;   APPLICATION OF WOUND VAC N/A 08/24/2021   Procedure: APPLICATION OF WOUND VAC;  Surgeon: Gaye Pollack, MD;  Location:  [place] OR;  Service: Thoracic;  Laterality: N/A;   APPLICATION OF WOUND VAC N/A 08/28/2021   Procedure: WOUND VAC CHANGE;  Surgeon: [name], MD;  Location: [place];  Service: Thoracic;  Laterality: N/A;   BREAST EXCISIONAL BIOPSY Right 1999   neg   BUBBLE STUDY  04/24/2021   Procedure: BUBBLE STUDY;  Surgeon: Sueanne Margarita, MD;  Location: [place];  Service: Cardiovascular;;   CATARACT EXTRACTION  12/2011   CATARACT EXTRACTION W/PHACO Left 12/02/2016   Procedure: CATARACT EXTRACTION PHACO AND INTRAOCULAR LENS PLACEMENT ([place]);  Surgeon: Estill Cotta, MD;  Location: [facility]S;  Service: Ophthalmology;  Laterality: Left;  [place] 2:14 AP% 28.4 CDE 59.73 Fluid pack lot # [id number]   COLONOSCOPY WITH PROPOFOL N/A 04/13/2022   Procedure: COLONOSCOPY WITH PROPOFOL;  Surgeon: Lin Landsman, MD;  Location: [facility]OSCOPY;  Service: Gastroenterology;  Laterality: N/A;   CORONARY ARTERY BYPASS GRAFT N/A 08/04/2021   Procedure: CORONARY ARTERY BYPASS GRAFTING (CABG) X  4  USING LEFT INTERNAL MAMMARY ARTERY AND RIGHT GREATER SAPHENOUS VEIN ENDOSCOPIC CONDUITS;  Surgeon: [name], MD;  Location: [place];  Service: Open Heart Surgery;  Laterality: N/A;  DIAGNOSTIC LAPAROSCOPY     EYE SURGERY     IR CT HEAD LTD  04/19/2021   IR PERCUTANEOUS ART THROMBECTOMY/INFUSION INTRACRANIAL INC DIAG ANGIO  04/19/2021       IR PERCUTANEOUS ART THROMBECTOMY/INFUSION INTRACRANIAL INC DIAG ANGIO  04/19/2021   IR US GUIDE VASC ACCESS LEFT  04/19/2021   RADIOLOGY WITH ANESTHESIA N/A 04/19/2021   Procedure: IR WITH ANESTHESIA;  Surgeon: Luanne Bras, MD;  Location: [place];  Service: Radiology;  Laterality: N/A;   STERNAL WOUND DEBRIDEMENT N/A 08/24/2021   Procedure: STERNAL WOUND DRAINAGE AND DEBRIDEMENT;  Surgeon: Gaye Pollack, MD;  Location: [place] OR;  Service: Thoracic;  Laterality: N/A;   STERNAL WOUND DEBRIDEMENT N/A 08/28/2021   Procedure: STERNAL WOUND DEBRIDEMENT;  Surgeon:  [name], MD;  Location: [place];  Service: Thoracic;  Laterality: N/A;   TEE WITHOUT CARDIOVERSION N/A 04/24/2021   Procedure: TRANSESOPHAGEAL ECHOCARDIOGRAM (TEE);  Surgeon: Sueanne Margarita, MD;  Location: [facility] ENDOSCOPY;  Service: Cardiovascular;  Laterality: N/A;   TEE WITHOUT CARDIOVERSION N/A 08/04/2021   Procedure: TRANSESOPHAGEAL ECHOCARDIOGRAM (TEE);  Surgeon: [name], MD;  Location: [place];  Service: Open Heart Surgery;  Laterality: N/A;   TOTAL HIP ARTHROPLASTY Right 04/04/2018   Procedure: TOTAL HIP ARTHROPLASTY;  Surgeon: Dereck Leep, MD;  Location: [facility] ORS;  Service: Orthopedics;  Laterality: Right;   Patient Active Problem List   Diagnosis Date Noted   Excessive cerumen in both ear canals 10/16/2022   Elevated hemoglobin ([place]) 10/16/2022   Hyperlipidemia associated with type 2 diabetes mellitus ([place]) 10/16/2022   Hypertension associated with diabetes ([place]) 10/16/2022   Type 2 diabetes mellitus with stage 2 chronic kidney disease, without long-term current use of insulin ([place]) 10/16/2022   Colon cancer screening    Subcutaneous nodule    Diabetic peripheral neuropathy ([place])    Debility 08/11/2021   S/P CABG x 4 08/04/2021   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   Hyperkalemia    Chronic bilateral low back pain without sciatica    Leukocytosis    Hyponatremia    Slow transit constipation    Hemiparesis affecting left side as late effect of stroke ([place])    History of CVA with residual deficit 04/19/2021   Dyslipidemia, goal LDL below 70 04/18/2021   Recurrent major depressive disorder, in partial remission ([place]) 09/20/2020   Status post total replacement of hip 04/04/2018   Primary osteoarthritis of right hip 02/20/2018   Allergic rhinitis 10/28/2015   Acute stress disorder 08/01/2015   Anxiety 08/01/2015   Clinical depression 08/01/2015   Diabetes mellitus with coincident hypertension ([place]) 08/01/2015   Diverticulosis of colon  08/01/2015   Generalized pruritus 08/01/2015   Cannot sleep 08/01/2015   Adiposity 08/01/2015   Detrusor muscle hypertonia 08/01/2015   Hypercholesterolemia without hypertriglyceridemia 08/01/2015   Female stress incontinence 08/01/2015   Avitaminosis D 08/01/2015    ONSET DATE: 04/18/2021  REFERRING DIAG: hemiparesis affecting L side as late effect of CVA; dsymetria  THERAPY DIAG:  Muscle weakness (generalized)  Other lack of coordination  Right thalamic infarction [facility])  Rationale for Evaluation and Treatment Rehabilitation  PERTINENT HISTORY: Pt s/p R CVA on 04/18/21. Pt was initially recovering very well from a stroke standpoint with mild LUE >LLE numbness and ambulating with a cane. 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 Therapy eval's recommended  CIR for decreased functional mobility - d/c to CIR on 8/29. She required incision debridement of sternal wound and application of VAC eventually discontinued on 9/29. She was discharged home on 9/30 (after a 32 day stay).  PRECAUTIONS: Fall  SUBJECTIVE: Pt reports feeling like her walking continues to improve.  Pt frustrated with the decreased coordination in her arm but acknowledges improvement.   PAIN:  Are you having pain? 0 pain   OBJECTIVE:    [facility] OT Assessment - 07/01/22 0001       07/01/22 08/18/22 08/27/22 11/12/22            Observation/Other Assessments       Focus on Therapeutic Outcomes (FOTO)  61  59 NT 54 (score did not convey improvement, but pt acknowledges using LUE better and more consistently with ADLs)            Coordination       Gross Motor Movements are Fluid and Coordinated No  No (improving) No (improving) No (improving)    Fine Motor Movements are Fluid and Coordinated No  No (improving) No (improving) No (not improved from last assessment); pt  has had several missed appointments since 08/27/22 d/t grandson being in the hospital.    Right 9 Hole Peg Test 31 sec  NT NT NT    Left 9 Hole Peg Test 3 min 44 sec   improved from 5 min 38 sec 2 min 50 sec (improved from 3 min 44 sec) 2 min 9 sec  2 min 16 sec            Hand Function       Right Hand Grip (lbs) 35  NT NT     Right Hand Lateral Pinch 10 lbs  NT NT     Right Hand 3 Point Pinch 7 lbs  NT NT     Left Hand Grip (lbs) 10   decline (L LF pain from triggering limiting grip strength) 23# 25# 29#    Left Hand Lateral Pinch 6 lbs  8# 9# 10#    Left 3 point pinch 5 lbs   finger slipping 8# 6# (finger slipping) 8# (finger slipping)        TODAY'S TREATMENT:   Moist heat applied to L hand for muscle relaxation intermittently applied during rest breaks from neuro re-ed activities.       Neuro re-ed: Pt practiced L hand [place] and dexterity skills, working to pick up small glass stones from table top 1 at a time, store multiple in hand, and worked Clinical cytogeneticist working to move 1 stone from palm to fingertips without dropping the others; min vc for technique.  Facilitated L [place] and [place] skills with Connect 4 game.  Pt used L hand to sort red and black chips, with practice picking up chips 1 at a time with L hand, storing up to 2 in hand, and discarding into slot on game board which required reaching at shoulder level.  Pt required vc to avoid resting fingers on top of game board when discarding chip into slot (a means to stabilize) in order to further challenge pt's accuracy with reaching toward a target.  PATIENT EDUCATION:  Education details: Continue to limit repetitive flexion of L 3rd digit and wear splint with activity to prevent triggering.  Person educated: Patient Education method: Explanation and Verbal cues Education comprehension: verbalized understanding, returned demonstration, and needs further education   HOME EXERCISE PROGRAM FMC/GMC activities for LUE  OT Short  Term Goals -        OT SHORT TERM GOAL #1   Title Pt will be indep to perform LUE HEP for coordination and strengthening.    Baseline Eval: not yet initiated; 06/30/22: pt remains consistent with HEP and avoiding repetitive gripping that causes pain in L LF; 08/18/22: pt remains consistent to engage LUE; HEP ongoing as function improves; 08/27/22: same as 08/18/22; 11/12/22: indep; pt regularly engages L hand into daily tasks and knows to maintain low reps with putty to prevent triggering of L LF PIP joint.    Time 6    Period Weeks    Status achieved   Target Date 09/29/22     OT SHORT TERM GOAL #2   Title Pt to be indep with joint protection and pain management strategies to limit triggering of L hand 3rd digit PIP.    Baseline Eval: Trigger finger splint recommended, initiated joint protection educ; 06/30/22: pt is consistent to wear her trigger finger splint and avoids repetitive gripping which causes pain to L LF PIP joint, though pt is still experiencing moderate pain in this joint with occasional triggering.  Pt plans to follow up with MD next week; 08/18/22: pt is indep with joint protection, though she states triggering has worsened since her cortisone injection, yet her pain is better.  Possibility that pt is using her hand more d/t less pain and OT reminded pt to limit repetition with gripping; 08/27/22: same as 08/18/22; 11/12/22: pt is indep with joint protection and states L LF has not triggered lately   Time 6    Period Weeks    Status achieved   Target Date 09/29/22              OT Long Term Goals -  02/04/23      OT LONG TERM GOAL #1   Title Pt will increase FOTO by 1-2 points to indicate improvement in perceived functional indep with daily tasks.    Baseline Eval: FOTO 60; 06/30/22: 61; 08/18/22: 59; 08/27/22: NT this date; 11/12/22: 54   Time 12    Period Weeks    Status ongoing   Target Date 02/04/23     OT LONG TERM GOAL #2   Title Pt will demonstrate increased L FMC/GMC to  enable indep with putting hair in ponytail.    Baseline Eval: unable; 06/30/22: pt can hold her hair in a ponytail with BUEs but can not yet manage the rubber band; 08/18/22: pt can tie hair in ponytail   Time 12    Period Weeks    Status Achieved    Target Date 08/10/22      OT LONG TERM GOAL #3   Title Pt will increase L FMC to enable indep with tying shoe laces.    Baseline Eval: unable to tie laces; wears slip on shoes; 06/30/22: pt still wearing slip on shoes; 08/18/22: pt stated she tied her laces the other day, but it took her 4 trials; 08/27/22: same as 08/18/22; 11/12/22: requires multiple trials    Time 12    Period Weeks    Status ongoing   Target Date 02/04/23     OT LONG TERM GOAL #4   Title Pt will increase L hand dexterity skills to enable pt to pick up coins from table with L hand.    Baseline Eval: unable; 06/30/22: pt can pick up coins from table with extra time.  Dexterity skills improving as noted by improved  9 hole peg test from 5 min 38 sec to 3 min 44 sec on the L; 08/18/22: Pt picks up coins from table with mild-moderate difficulty, L 9 hole 2 min 50 sec; 08/27/22: L 9 hole 2 min 9 secs; 11/12/22: 2 min 16 sec (mild-mod difficulty with picking up coins from a table)   Time 12    Period Weeks    Status ongoing   Target Date 02/04/23     OT LONG TERM GOAL #5   Title Pt will increase L grip strength by 5 or more lbs to improve ability to hold and carry ADL supplies in L hand.    Baseline Eval: L grip 17# (R 35#); 06/30/22: L grip 10# (pain in L LF significantly limiting grip as finger continues to trigger; 08/18/22: L grip 23#; 08/27/22: L grip 25#; 02/04/23: L grip 29#   Time 12    Period Weeks    Status ongoing   Target Date 02/04/23            Plan - 06/02/22 1036     Clinical Impression Statement Pt continues to work towards improving ability to pick up small objects from the table and reaching to accurately place those objects toward a target.  Pt is improving with ability  to pick up flat objects from table top (ie Connect 4 chips and disc shaped magnets), but pieces still tend to slide around, requiring multiple attempts at successful pick up.  Pt practiced storing multiple small stones in hand and translatory skills to move stones from palm to fingertips with only occasional dropping.  Pt will continue to benefit from skilled OT for increasing LUE strength and maximizing coordination skills in order to increase efficiency when engaging LUE into daily tasks.      OT Occupational Profile and History Problem Focused Assessment - Including review of records relating to presenting problem    Occupational performance deficits (Please refer to evaluation for details): ADL's;IADL's;Work    Body Structure / Function / Physical Skills ADL;GMC;UE functional use;FMC;Decreased knowledge of use of DME;Pain;Strength;Coordination;Dexterity;IADL;Sensation;Balance    Rehab Potential Good    Clinical Decision Making Limited treatment options, no task modification necessary    Comorbidities Affecting Occupational Performance: May have comorbidities impacting occupational performance    Modification or Assistance to Complete Evaluation  No modification of tasks or assist necessary to complete eval    OT Frequency 1-2x / week    OT Duration 12 weeks    OT Treatment/Interventions Self-care/ADL training;Therapeutic exercise;Therapeutic activities;Cryotherapy;Moist Heat;Neuromuscular education;DME and/or AE instruction;Manual Therapy;Splinting;Patient/family education    Plan OT focus on LUE coordination/strength training and education/splinting for L hand 3rd digit PIP triggering    OT Home Exercise Plan L GMC/FMC tasks, strengthening as tolerated   Consulted and Agree with Plan of Care Patient            Leta Speller, MS, OTR/L   Darleene Cleaver, OT 11/22/2022, 7:05 PM

## 2022-11-24 ENCOUNTER — Ambulatory Visit: Payer: PPO

## 2022-11-24 DIAGNOSIS — R278 Other lack of coordination: Secondary | ICD-10-CM

## 2022-11-24 DIAGNOSIS — M6281 Muscle weakness (generalized): Secondary | ICD-10-CM

## 2022-11-24 DIAGNOSIS — I6381 Other cerebral infarction due to occlusion or stenosis of small artery: Secondary | ICD-10-CM

## 2022-11-25 NOTE — Therapy (Signed)
OUTPATIENT OCCUPATIONAL THERAPY NEURO TREATMENT NOTE    Patient Name: [name] MRN: [id number] DOB:[birth year], 66 y.o., female Today's Date: 11/25/2022  PCP: Dr. Birdie Sons REFERRING PROVIDER: Frann Rider, NP   OT End of Session - 11/25/22 0907     Visit Number 33    Number of Visits 78    Date for OT Re-Evaluation 02/04/23    Authorization Time Period Reporting period beginning 11/12/22    OT Start Time 1645    OT Stop Time 1730    OT Time Calculation (min) 45 min    Equipment Utilized During Treatment [facility]    Activity Tolerance Patient tolerated treatment well    Behavior During Therapy [facility] for tasks assessed/performed               Past Medical History:  Diagnosis Date   Anxiety    Arthritis    CAD (coronary artery disease)    a. 3 vessel CAD noted on coronary CTA 04/2021, s/p CABG x4   Cataract    left   Depression    Diabetes mellitus without complication ([place])    type 2   Environmental and seasonal allergies    GERD (gastroesophageal reflux disease)    Hyperlipidemia    Hypertension    Joint pain    as reported by patient   Papillary fibroelastoma of heart    of the aortic valve,  s/p AVR and tumor resection on 08/04/2021   Right thalamic infarction ([place]) 04/29/2021   S/P AVR (aortic valve replacement)    s/p AVR with resection of AV fibroelastoma on 08/04/2021   S/P CABG x 4    a. s/p CABG x4 LIMA to LAD, SVG to PDA, and Sequential SVG to OM and Diagonal on 8/   Stroke ([place]) 04/2021   admitted at [facility] - left sided weakness   Urinary incontinence    as stated by patient   Past Surgical History:  Procedure Laterality Date   AORTIC VALVE REPLACEMENT N/A 08/04/2021   Procedure: RESECTION AORTIC VALVE TUMOR;  Surgeon: [name], MD;  Location: [place];  Service: Open Heart Surgery;  Laterality: N/A;   APPLICATION OF WOUND VAC N/A 08/24/2021   Procedure: APPLICATION OF WOUND VAC;  Surgeon: Gaye Pollack, MD;  Location:  [place] OR;  Service: Thoracic;  Laterality: N/A;   APPLICATION OF WOUND VAC N/A 08/28/2021   Procedure: WOUND VAC CHANGE;  Surgeon: [name], MD;  Location: [place];  Service: Thoracic;  Laterality: N/A;   BREAST EXCISIONAL BIOPSY Right 1999   neg   BUBBLE STUDY  04/24/2021   Procedure: BUBBLE STUDY;  Surgeon: Sueanne Margarita, MD;  Location: [place];  Service: Cardiovascular;;   CATARACT EXTRACTION  12/2011   CATARACT EXTRACTION W/PHACO Left 12/02/2016   Procedure: CATARACT EXTRACTION PHACO AND INTRAOCULAR LENS PLACEMENT ([place]);  Surgeon: Estill Cotta, MD;  Location: [facility];  Service: Ophthalmology;  Laterality: Left;  [place] 2:14 AP% 28.4 CDE 59.73 Fluid pack lot # [id number]   COLONOSCOPY WITH PROPOFOL N/A 04/13/2022   Procedure: COLONOSCOPY WITH PROPOFOL;  Surgeon: Lin Landsman, MD;  Location: [facility];  Service: Gastroenterology;  Laterality: N/A;   CORONARY ARTERY BYPASS GRAFT N/A 08/04/2021   Procedure: CORONARY ARTERY BYPASS GRAFTING (CABG) X  4  USING LEFT INTERNAL MAMMARY ARTERY AND RIGHT GREATER SAPHENOUS VEIN ENDOSCOPIC CONDUITS;  Surgeon: [name], MD;  Location: [place];  Service: Open Heart Surgery;  Laterality: N/A;  DIAGNOSTIC LAPAROSCOPY     EYE SURGERY     IR CT HEAD LTD  04/19/2021   IR PERCUTANEOUS ART THROMBECTOMY/INFUSION INTRACRANIAL INC DIAG ANGIO  04/19/2021       IR PERCUTANEOUS ART THROMBECTOMY/INFUSION INTRACRANIAL INC DIAG ANGIO  04/19/2021   IR US GUIDE VASC ACCESS LEFT  04/19/2021   RADIOLOGY WITH ANESTHESIA N/A 04/19/2021   Procedure: IR WITH ANESTHESIA;  Surgeon: Luanne Bras, MD;  Location: [place];  Service: Radiology;  Laterality: N/A;   STERNAL WOUND DEBRIDEMENT N/A 08/24/2021   Procedure: STERNAL WOUND DRAINAGE AND DEBRIDEMENT;  Surgeon: Gaye Pollack, MD;  Location: [place] OR;  Service: Thoracic;  Laterality: N/A;   STERNAL WOUND DEBRIDEMENT N/A 08/28/2021   Procedure: STERNAL WOUND DEBRIDEMENT;  Surgeon:  [name], MD;  Location: [place];  Service: Thoracic;  Laterality: N/A;   TEE WITHOUT CARDIOVERSION N/A 04/24/2021   Procedure: TRANSESOPHAGEAL ECHOCARDIOGRAM (TEE);  Surgeon: Sueanne Margarita, MD;  Location: [place] [facility];  Service: Cardiovascular;  Laterality: N/A;   TEE WITHOUT CARDIOVERSION N/A 08/04/2021   Procedure: TRANSESOPHAGEAL ECHOCARDIOGRAM (TEE);  Surgeon: [name], MD;  Location: [place];  Service: Open Heart Surgery;  Laterality: N/A;   TOTAL HIP ARTHROPLASTY Right 04/04/2018   Procedure: TOTAL HIP ARTHROPLASTY;  Surgeon: Dereck Leep, MD;  Location: [facility];  Service: Orthopedics;  Laterality: Right;   Patient Active Problem List   Diagnosis Date Noted   Excessive cerumen in both ear canals 10/16/2022   Elevated hemoglobin ([place]) 10/16/2022   Hyperlipidemia associated with type 2 diabetes mellitus ([place]) 10/16/2022   Hypertension associated with diabetes ([place]) 10/16/2022   Type 2 diabetes mellitus with stage 2 chronic kidney disease, without long-term current use of insulin ([place]) 10/16/2022   Colon cancer screening    Subcutaneous nodule    Diabetic peripheral neuropathy ([place])    Debility 08/11/2021   S/P CABG x 4 08/04/2021   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   Hyperkalemia    Chronic bilateral low back pain without sciatica    Leukocytosis    Hyponatremia    Slow transit constipation    Hemiparesis affecting left side as late effect of stroke ([place])    History of CVA with residual deficit 04/19/2021   Dyslipidemia, goal LDL below 70 04/18/2021   Recurrent major depressive disorder, in partial remission ([place]) 09/20/2020   Status post total replacement of hip 04/04/2018   Primary osteoarthritis of right hip 02/20/2018   Allergic rhinitis 10/28/2015   Acute stress disorder 08/01/2015   Anxiety 08/01/2015   Clinical depression 08/01/2015   Diabetes mellitus with coincident hypertension ([place]) 08/01/2015   Diverticulosis of colon  08/01/2015   Generalized pruritus 08/01/2015   Cannot sleep 08/01/2015   Adiposity 08/01/2015   Detrusor muscle hypertonia 08/01/2015   Hypercholesterolemia without hypertriglyceridemia 08/01/2015   Female stress incontinence 08/01/2015   Avitaminosis D 08/01/2015    ONSET DATE: 04/18/2021  REFERRING DIAG: hemiparesis affecting L side as late effect of CVA; dsymetria  THERAPY DIAG:  Muscle weakness (generalized)  Other lack of coordination  Right thalamic infarction [facility])  Rationale for Evaluation and Treatment Rehabilitation  PERTINENT HISTORY: Pt s/p R CVA on 04/18/21. Pt was initially recovering very well from a stroke standpoint with mild LUE >LLE numbness and ambulating with a cane. 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 Therapy eval's recommended  CIR for decreased functional mobility - d/c to CIR on 8/29. She required incision debridement of sternal wound and application of VAC eventually discontinued on 9/29. She was discharged home on 9/30 (after a 32 day stay).  PRECAUTIONS: Fall  SUBJECTIVE: Pt reports feeling tired today.  Pt states she prefers morning appointments, but now that her older grandkids are back in school, pt needs to watch her younger grandson in the mornings, so afternoon appointments are necessary right now.     PAIN:  Are you having pain? 0 pain   OBJECTIVE:    [facility] OT Assessment - 07/01/22 0001       07/01/22 08/18/22 08/27/22 11/12/22            Observation/Other Assessments       Focus on Therapeutic Outcomes (FOTO)  61  59 NT 54 (score did not convey improvement, but pt acknowledges using LUE better and more consistently with ADLs)            Coordination       Gross Motor Movements are Fluid and Coordinated No  No (improving) No (improving) No (improving)    Fine Motor Movements are Fluid  and Coordinated No  No (improving) No (improving) No (not improved from last assessment); pt has had several missed appointments since 08/27/22 d/t grandson being in the hospital.    Right 9 Hole Peg Test 31 sec  NT NT NT    Left 9 Hole Peg Test 3 min 44 sec   improved from 5 min 38 sec 2 min 50 sec (improved from 3 min 44 sec) 2 min 9 sec  2 min 16 sec            Hand Function       Right Hand Grip (lbs) 35  NT NT     Right Hand Lateral Pinch 10 lbs  NT NT     Right Hand 3 Point Pinch 7 lbs  NT NT     Left Hand Grip (lbs) 10   decline (L LF pain from triggering limiting grip strength) 23# 25# 29#    Left Hand Lateral Pinch 6 lbs  8# 9# 10#    Left 3 point pinch 5 lbs   finger slipping 8# 6# (finger slipping) 8# (finger slipping)        TODAY'S TREATMENT:   Moist heat not available today d/t equipment malfunction.      Neuro re-ed: Pt practiced L hand [place], dexterity skills, and [place] skills working with [employer] and pegs.  Pt used LUE to place pegs into pegboard dot side down; pegboard placed on an incline to further challenge LUE GMC engaging the L shoulder into forward reaching patterns.  2 of 36 pegs dropped to floor during placement into pegboard, several more dropped but caught with L or R hand.  Pt placed pegs to form a 36 peg design copied from a design card.  Once complete, pt used the LUE to rotate pegs in hand to place all pegs dot side up.  Pt dropped ~50% of the 36 pegs when working to flip them within her fingertips to place dot side up.  Min vc for technique.  Pt then practiced removing pegs 1 at a time, storing 2-3 in palm, then discarding onto tabletop 1 at a time from hand with minimal dropped pegs.    Self Care: Participated in L hand coin manipulation activities with coin pick up from table, storage of coins within palm of  hand, transferring coins from palm of hand to fingertips to enable discarding coins one at a time without dropping.  Facilitated L 3 point pinch prehension  working to pick up coins from table and sustain 3 point pinch while pushing coins through resistive slotted bank.  Pt required min vc for pinch prehension pattern as L LF tends to slip for position.  PATIENT EDUCATION:  Education details: Continue to limit repetitive flexion of L 3rd digit and wear splint with activity to prevent triggering.  Person educated: Patient Education method: Explanation and Verbal cues Education comprehension: verbalized understanding, returned demonstration, and needs further education   HOME EXERCISE PROGRAM FMC/GMC activities for LUE    OT Short Term Goals -        OT SHORT TERM GOAL #1   Title Pt will be indep to perform LUE HEP for coordination and strengthening.    Baseline Eval: not yet initiated; 06/30/22: pt remains consistent with HEP and avoiding repetitive gripping that causes pain in L LF; 08/18/22: pt remains consistent to engage LUE; HEP ongoing as function improves; 08/27/22: same as 08/18/22; 11/12/22: indep; pt regularly engages L hand into daily tasks and knows to maintain low reps with putty to prevent triggering of L LF PIP joint.    Time 6    Period Weeks    Status achieved   Target Date 09/29/22     OT SHORT TERM GOAL #2   Title Pt to be indep with joint protection and pain management strategies to limit triggering of L hand 3rd digit PIP.    Baseline Eval: Trigger finger splint recommended, initiated joint protection educ; 06/30/22: pt is consistent to wear her trigger finger splint and avoids repetitive gripping which causes pain to L LF PIP joint, though pt is still experiencing moderate pain in this joint with occasional triggering.  Pt plans to follow up with MD next week; 08/18/22: pt is indep with joint protection, though she states triggering has worsened since her cortisone injection, yet her pain is better.  Possibility that pt is using her hand more d/t less pain and OT reminded pt to limit repetition with gripping; 08/27/22: same as 08/18/22;  11/12/22: pt is indep with joint protection and states L LF has not triggered lately   Time 6    Period Weeks    Status achieved   Target Date 09/29/22              OT Long Term Goals -  02/04/23      OT LONG TERM GOAL #1   Title Pt will increase FOTO by 1-2 points to indicate improvement in perceived functional indep with daily tasks.    Baseline Eval: FOTO 60; 06/30/22: 61; 08/18/22: 59; 08/27/22: NT this date; 11/12/22: 54   Time 12    Period Weeks    Status ongoing   Target Date 02/04/23     OT LONG TERM GOAL #2   Title Pt will demonstrate increased L FMC/GMC to enable indep with putting hair in ponytail.    Baseline Eval: unable; 06/30/22: pt can hold her hair in a ponytail with BUEs but can not yet manage the rubber band; 08/18/22: pt can tie hair in ponytail   Time 12    Period Weeks    Status Achieved    Target Date 08/10/22      OT LONG TERM GOAL #3   Title Pt will increase L FMC to enable indep with tying shoe laces.  Baseline Eval: unable to tie laces; wears slip on shoes; 06/30/22: pt still wearing slip on shoes; 08/18/22: pt stated she tied her laces the other day, but it took her 4 trials; 08/27/22: same as 08/18/22; 11/12/22: requires multiple trials    Time 12    Period Weeks    Status ongoing   Target Date 02/04/23     OT LONG TERM GOAL #4   Title Pt will increase L hand dexterity skills to enable pt to pick up coins from table with L hand.    Baseline Eval: unable; 06/30/22: pt can pick up coins from table with extra time.  Dexterity skills improving as noted by improved 9 hole peg test from 5 min 38 sec to 3 min 44 sec on the L; 08/18/22: Pt picks up coins from table with mild-moderate difficulty, L 9 hole 2 min 50 sec; 08/27/22: L 9 hole 2 min 9 secs; 11/12/22: 2 min 16 sec (mild-mod difficulty with picking up coins from a table)   Time 12    Period Weeks    Status ongoing   Target Date 02/04/23     OT LONG TERM GOAL #5   Title Pt will increase L grip strength by 5 or  more lbs to improve ability to hold and carry ADL supplies in L hand.    Baseline Eval: L grip 17# (R 35#); 06/30/22: L grip 10# (pain in L LF significantly limiting grip as finger continues to trigger; 08/18/22: L grip 23#; 08/27/22: L grip 25#; 02/04/23: L grip 29#   Time 12    Period Weeks    Status ongoing   Target Date 02/04/23            Plan - 06/02/22 1036     Clinical Impression Statement Pt continues to progress with [facility] skills noting fairly good stability of LUE while reaching to place pegs into [place] board placed on an incline at table top level.  Pt tends to drop pegs when working to reposition them within fingertips, ie flipping pegs 180 degrees from dot side down to dot side up.  Pt continues to work on 3 point pinch prehension patterns as L LF tends to slip from this position when challenged with resistance, ie using 3 point pinch to push a coin through resistive bank slot.  Pt will continue to benefit from skilled OT for increasing LUE strength and maximizing coordination skills in order to increase efficiency when engaging LUE into daily tasks.      OT Occupational Profile and History Problem Focused Assessment - Including review of records relating to presenting problem    Occupational performance deficits (Please refer to evaluation for details): ADL's;IADL's;Work    Body Structure / Function / Physical Skills ADL;GMC;UE functional use;FMC;Decreased knowledge of use of DME;Pain;Strength;Coordination;Dexterity;IADL;Sensation;Balance    Rehab Potential Good    Clinical Decision Making Limited treatment options, no task modification necessary    Comorbidities Affecting Occupational Performance: May have comorbidities impacting occupational performance    Modification or Assistance to Complete Evaluation  No modification of tasks or assist necessary to complete eval    OT Frequency 1-2x / week    OT Duration 12 weeks    OT Treatment/Interventions Self-care/ADL training;Therapeutic  exercise;Therapeutic activities;Cryotherapy;Moist Heat;Neuromuscular education;DME and/or AE instruction;Manual Therapy;Splinting;Patient/family education    Plan OT focus on LUE coordination/strength training and education/splinting for L hand 3rd digit PIP triggering    OT Home Exercise Plan L GMC/FMC tasks, strengthening as tolerated   Consulted and  Agree with Plan of Care Patient            Leta Speller, MS, OTR/L   Darleene Cleaver, OT 11/25/2022, 9:09 AM

## 2022-11-26 ENCOUNTER — Ambulatory Visit: Payer: PPO

## 2022-11-26 DIAGNOSIS — M6281 Muscle weakness (generalized): Secondary | ICD-10-CM | POA: Diagnosis not present

## 2022-11-26 DIAGNOSIS — R278 Other lack of coordination: Secondary | ICD-10-CM

## 2022-11-26 DIAGNOSIS — I6381 Other cerebral infarction due to occlusion or stenosis of small artery: Secondary | ICD-10-CM

## 2022-11-27 ENCOUNTER — Encounter: Payer: PPO | Attending: Physical Medicine and Rehabilitation | Admitting: Physical Medicine and Rehabilitation

## 2022-11-27 ENCOUNTER — Other Ambulatory Visit: Payer: Self-pay

## 2022-11-27 VITALS — BP 199/108 | HR 80 | Ht 67.0 in | Wt 177.0 lb

## 2022-11-27 DIAGNOSIS — R531 Weakness: Secondary | ICD-10-CM | POA: Diagnosis not present

## 2022-11-27 DIAGNOSIS — R32 Unspecified urinary incontinence: Secondary | ICD-10-CM | POA: Diagnosis not present

## 2022-11-27 DIAGNOSIS — M6530 Trigger finger, unspecified finger: Secondary | ICD-10-CM | POA: Insufficient documentation

## 2022-11-27 DIAGNOSIS — F419 Anxiety disorder, unspecified: Secondary | ICD-10-CM | POA: Diagnosis not present

## 2022-11-27 MED ORDER — MIRABEGRON ER 25 MG PO TB24
25.0000 mg | ORAL_TABLET | Freq: Every day | ORAL | 3 refills | Status: DC
  Filled 2022-11-27: qty 90, 90d supply, fill #0
  Filled 2022-11-27: qty 90, 30d supply, fill #0
  Filled 2022-12-08: qty 30, 30d supply, fill #0

## 2022-11-27 MED ORDER — ALPRAZOLAM 0.5 MG PO TABS
0.5000 mg | ORAL_TABLET | Freq: Two times a day (BID) | ORAL | 3 refills | Status: DC | PRN
  Filled 2022-11-27 – 2023-02-17 (×2): qty 180, 90d supply, fill #0
  Filled 2023-05-20: qty 180, 90d supply, fill #1

## 2022-11-27 NOTE — Therapy (Signed)
OUTPATIENT OCCUPATIONAL THERAPY NEURO TREATMENT NOTE    Patient Name: [name] MRN: [id number] DOB:1956-01-23, 66 y.o., female Today's Date: 11/27/2022  PCP: Dr. Birdie Sons REFERRING PROVIDER: Frann Rider, NP   OT End of Session - 11/27/22 0905     Visit Number 34    Number of Visits 63    Date for OT Re-Evaluation 02/04/23    Authorization Time Period Reporting period beginning 11/12/22    OT Start Time 1640    OT Stop Time 56    OT Time Calculation (min) 45 min    Equipment Utilized During Treatment [facility]    Activity Tolerance Patient tolerated treatment well    Behavior During Therapy [facility] for tasks assessed/performed               Past Medical History:  Diagnosis Date   Anxiety    Arthritis    CAD (coronary artery disease)    a. 3 vessel CAD noted on coronary CTA 04/2021, s/p CABG x4   Cataract    left   Depression    Diabetes mellitus without complication ([place])    type 2   Environmental and seasonal allergies    GERD (gastroesophageal reflux disease)    Hyperlipidemia    Hypertension    Joint pain    as reported by patient   Papillary fibroelastoma of heart    of the aortic valve,  s/p AVR and tumor resection on 08/04/2021   Right thalamic infarction ([place]) 04/29/2021   S/P AVR (aortic valve replacement)    s/p AVR with resection of AV fibroelastoma on 08/04/2021   S/P CABG x 4    a. s/p CABG x4 LIMA to LAD, SVG to PDA, and Sequential SVG to OM and Diagonal on 8/   Stroke ([place]) 04/2021   admitted at [facility] - left sided weakness   Urinary incontinence    as stated by patient   Past Surgical History:  Procedure Laterality Date   AORTIC VALVE REPLACEMENT N/A 08/04/2021   Procedure: RESECTION AORTIC VALVE TUMOR;  Surgeon: [name], MD;  Location: [place];  Service: Open Heart Surgery;  Laterality: N/A;   APPLICATION OF WOUND VAC N/A 08/24/2021   Procedure: APPLICATION OF WOUND VAC;  Surgeon: Gaye Pollack, MD;  Location:  [place] OR;  Service: Thoracic;  Laterality: N/A;   APPLICATION OF WOUND VAC N/A 08/28/2021   Procedure: WOUND VAC CHANGE;  Surgeon: [name], MD;  Location: [place];  Service: Thoracic;  Laterality: N/A;   BREAST EXCISIONAL BIOPSY Right 1999   neg   BUBBLE STUDY  04/24/2021   Procedure: BUBBLE STUDY;  Surgeon: Sueanne Margarita, MD;  Location: [place];  Service: Cardiovascular;;   CATARACT EXTRACTION  12/2011   CATARACT EXTRACTION W/PHACO Left 12/02/2016   Procedure: CATARACT EXTRACTION PHACO AND INTRAOCULAR LENS PLACEMENT ([place]);  Surgeon: Estill Cotta, MD;  Location: [facility]S;  Service: Ophthalmology;  Laterality: Left;  [place] 2:14 AP% 28.4 CDE 59.73 Fluid pack lot # [id number]   COLONOSCOPY WITH PROPOFOL N/A 04/13/2022   Procedure: COLONOSCOPY WITH PROPOFOL;  Surgeon: Lin Landsman, MD;  Location: [facility] ENDOSCOPY;  Service: Gastroenterology;  Laterality: N/A;   CORONARY ARTERY BYPASS GRAFT N/A 08/04/2021   Procedure: CORONARY ARTERY BYPASS GRAFTING (CABG) X  4  USING LEFT INTERNAL MAMMARY ARTERY AND RIGHT GREATER SAPHENOUS VEIN ENDOSCOPIC CONDUITS;  Surgeon: [name], MD;  Location: [place];  Service: Open Heart Surgery;  Laterality: N/A;  DIAGNOSTIC LAPAROSCOPY     EYE SURGERY     IR CT HEAD LTD  04/19/2021   IR PERCUTANEOUS ART THROMBECTOMY/INFUSION INTRACRANIAL INC DIAG ANGIO  04/19/2021       IR PERCUTANEOUS ART THROMBECTOMY/INFUSION INTRACRANIAL INC DIAG ANGIO  04/19/2021   IR US GUIDE VASC ACCESS LEFT  04/19/2021   RADIOLOGY WITH ANESTHESIA N/A 04/19/2021   Procedure: IR WITH ANESTHESIA;  Surgeon: Luanne Bras, MD;  Location: [place];  Service: Radiology;  Laterality: N/A;   STERNAL WOUND DEBRIDEMENT N/A 08/24/2021   Procedure: STERNAL WOUND DRAINAGE AND DEBRIDEMENT;  Surgeon: Gaye Pollack, MD;  Location: [place] OR;  Service: Thoracic;  Laterality: N/A;   STERNAL WOUND DEBRIDEMENT N/A 08/28/2021   Procedure: STERNAL WOUND DEBRIDEMENT;  Surgeon:  [name], MD;  Location: [place];  Service: Thoracic;  Laterality: N/A;   TEE WITHOUT CARDIOVERSION N/A 04/24/2021   Procedure: TRANSESOPHAGEAL ECHOCARDIOGRAM (TEE);  Surgeon: Sueanne Margarita, MD;  Location: [facility] ENDOSCOPY;  Service: Cardiovascular;  Laterality: N/A;   TEE WITHOUT CARDIOVERSION N/A 08/04/2021   Procedure: TRANSESOPHAGEAL ECHOCARDIOGRAM (TEE);  Surgeon: [name], MD;  Location: [place];  Service: Open Heart Surgery;  Laterality: N/A;   TOTAL HIP ARTHROPLASTY Right 04/04/2018   Procedure: TOTAL HIP ARTHROPLASTY;  Surgeon: Dereck Leep, MD;  Location: [facility] ORS;  Service: Orthopedics;  Laterality: Right;   Patient Active Problem List   Diagnosis Date Noted   Excessive cerumen in both ear canals 10/16/2022   Elevated hemoglobin ([place]) 10/16/2022   Hyperlipidemia associated with type 2 diabetes mellitus ([place]) 10/16/2022   Hypertension associated with diabetes ([place]) 10/16/2022   Type 2 diabetes mellitus with stage 2 chronic kidney disease, without long-term current use of insulin ([place]) 10/16/2022   Colon cancer screening    Subcutaneous nodule    Diabetic peripheral neuropathy ([place])    Debility 08/11/2021   S/P CABG x 4 08/04/2021   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   Hyperkalemia    Chronic bilateral low back pain without sciatica    Leukocytosis    Hyponatremia    Slow transit constipation    Hemiparesis affecting left side as late effect of stroke ([place])    History of CVA with residual deficit 04/19/2021   Dyslipidemia, goal LDL below 70 04/18/2021   Recurrent major depressive disorder, in partial remission ([place]) 09/20/2020   Status post total replacement of hip 04/04/2018   Primary osteoarthritis of right hip 02/20/2018   Allergic rhinitis 10/28/2015   Acute stress disorder 08/01/2015   Anxiety 08/01/2015   Clinical depression 08/01/2015   Diabetes mellitus with coincident hypertension ([place]) 08/01/2015   Diverticulosis of colon  08/01/2015   Generalized pruritus 08/01/2015   Cannot sleep 08/01/2015   Adiposity 08/01/2015   Detrusor muscle hypertonia 08/01/2015   Hypercholesterolemia without hypertriglyceridemia 08/01/2015   Female stress incontinence 08/01/2015   Avitaminosis D 08/01/2015    ONSET DATE: 04/18/2021  REFERRING DIAG: hemiparesis affecting L side as late effect of CVA; dsymetria  THERAPY DIAG:  Muscle weakness (generalized)  Other lack of coordination  Right thalamic infarction [facility]  Rationale for Evaluation and Treatment Rehabilitation  PERTINENT HISTORY: Pt s/p R CVA on 04/18/21. Pt was initially recovering very well from a stroke standpoint with mild LUE >LLE numbness and ambulating with a cane. 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 Therapy eval's recommended  CIR for decreased functional mobility - d/c to CIR on 8/29. She required incision debridement of sternal wound and application of VAC eventually discontinued on 9/29. She was discharged home on 9/30 (after a 32 day stay).  PRECAUTIONS: Fall  SUBJECTIVE: Pt reports doing well today.  PAIN:  Are you having pain? 0 pain   OBJECTIVE:    [facility] OT Assessment - 07/01/22 0001       07/01/22 08/18/22 08/27/22 11/12/22            Observation/Other Assessments       Focus on Therapeutic Outcomes (FOTO)  61  59 NT 54 (score did not convey improvement, but pt acknowledges using LUE better and more consistently with ADLs)            Coordination       Gross Motor Movements are Fluid and Coordinated No  No (improving) No (improving) No (improving)    Fine Motor Movements are Fluid and Coordinated No  No (improving) No (improving) No (not improved from last assessment); pt has had several missed appointments since 08/27/22 d/t grandson being in the hospital.    Right 9 Hole Peg Test 31  sec  NT NT NT    Left 9 Hole Peg Test 3 min 44 sec   improved from 5 min 38 sec 2 min 50 sec (improved from 3 min 44 sec) 2 min 9 sec  2 min 16 sec            Hand Function       Right Hand Grip (lbs) 35  NT NT     Right Hand Lateral Pinch 10 lbs  NT NT     Right Hand 3 Point Pinch 7 lbs  NT NT     Left Hand Grip (lbs) 10   decline (L LF pain from triggering limiting grip strength) 23# 25# 29#    Left Hand Lateral Pinch 6 lbs  8# 9# 10#    Left 3 point pinch 5 lbs   finger slipping 8# 6# (finger slipping) 8# (finger slipping)        TODAY'S TREATMENT:   Moist heat not available today d/t equipment malfunction.      Neuro re-ed: Pt practiced L hand lateral pinching while reaching toward a target clipping yellow and red clothespins on even numbers of yard stick.  Yardstick placed horizontally off the table top to facilitate forward and lateral reaching patterns with the LUE.  Pt practiced maintaining a 3 point pinch pattern on the L hand working to pick up ball pegs and place onto pegboard.  Pt required intermittent min vc for positioning of LF on peg, working to keep LF from sliding.  Practiced placing pegs into board with board flat on table top, them removing pegs with pegboard placed on an angle from table top to further challenge forward reaching, and pt able to maintain the 3 point pinch pattern at least 75% of the time with min vc.  Pt worked with [facility] discs placing into grid and flipping from black side down to up, min vc for technique to reduce dropped discs.  PATIENT EDUCATION:  Education details: Continue to limit repetitive flexion of L 3rd digit and wear splint with activity to prevent triggering.  Person educated: Patient Education method: Explanation and Verbal cues Education comprehension: verbalized understanding, returned demonstration, and needs further education   HOME EXERCISE PROGRAM FMC/GMC activities for LUE    OT Short Term Goals -  OT SHORT TERM GOAL  #1   Title Pt will be indep to perform LUE HEP for coordination and strengthening.    Baseline Eval: not yet initiated; 06/30/22: pt remains consistent with HEP and avoiding repetitive gripping that causes pain in L LF; 08/18/22: pt remains consistent to engage LUE; HEP ongoing as function improves; 08/27/22: same as 08/18/22; 11/12/22: indep; pt regularly engages L hand into daily tasks and knows to maintain low reps with putty to prevent triggering of L LF PIP joint.    Time 6    Period Weeks    Status achieved   Target Date 09/29/22     OT SHORT TERM GOAL #2   Title Pt to be indep with joint protection and pain management strategies to limit triggering of L hand 3rd digit PIP.    Baseline Eval: Trigger finger splint recommended, initiated joint protection educ; 06/30/22: pt is consistent to wear her trigger finger splint and avoids repetitive gripping which causes pain to L LF PIP joint, though pt is still experiencing moderate pain in this joint with occasional triggering.  Pt plans to follow up with MD next week; 08/18/22: pt is indep with joint protection, though she states triggering has worsened since her cortisone injection, yet her pain is better.  Possibility that pt is using her hand more d/t less pain and OT reminded pt to limit repetition with gripping; 08/27/22: same as 08/18/22; 11/12/22: pt is indep with joint protection and states L LF has not triggered lately   Time 6    Period Weeks    Status achieved   Target Date 09/29/22              OT Long Term Goals -  02/04/23      OT LONG TERM GOAL #1   Title Pt will increase FOTO by 1-2 points to indicate improvement in perceived functional indep with daily tasks.    Baseline Eval: FOTO 60; 06/30/22: 61; 08/18/22: 59; 08/27/22: NT this date; 11/12/22: 54   Time 12    Period Weeks    Status ongoing   Target Date 02/04/23     OT LONG TERM GOAL #2   Title Pt will demonstrate increased L FMC/GMC to enable indep with putting hair in ponytail.     Baseline Eval: unable; 06/30/22: pt can hold her hair in a ponytail with BUEs but can not yet manage the rubber band; 08/18/22: pt can tie hair in ponytail   Time 12    Period Weeks    Status Achieved    Target Date 08/10/22      OT LONG TERM GOAL #3   Title Pt will increase L FMC to enable indep with tying shoe laces.    Baseline Eval: unable to tie laces; wears slip on shoes; 06/30/22: pt still wearing slip on shoes; 08/18/22: pt stated she tied her laces the other day, but it took her 4 trials; 08/27/22: same as 08/18/22; 11/12/22: requires multiple trials    Time 12    Period Weeks    Status ongoing   Target Date 02/04/23     OT LONG TERM GOAL #4   Title Pt will increase L hand dexterity skills to enable pt to pick up coins from table with L hand.    Baseline Eval: unable; 06/30/22: pt can pick up coins from table with extra time.  Dexterity skills improving as noted by improved 9 hole peg test from 5 min 38 sec to 3 min  44 sec on the L; 08/18/22: Pt picks up coins from table with mild-moderate difficulty, L 9 hole 2 min 50 sec; 08/27/22: L 9 hole 2 min 9 secs; 11/12/22: 2 min 16 sec (mild-mod difficulty with picking up coins from a table)   Time 12    Period Weeks    Status ongoing   Target Date 02/04/23     OT LONG TERM GOAL #5   Title Pt will increase L grip strength by 5 or more lbs to improve ability to hold and carry ADL supplies in L hand.    Baseline Eval: L grip 17# (R 35#); 06/30/22: L grip 10# (pain in L LF significantly limiting grip as finger continues to trigger; 08/18/22: L grip 23#; 08/27/22: L grip 25#; 02/04/23: L grip 29#   Time 12    Period Weeks    Status ongoing   Target Date 02/04/23            Plan - 06/02/22 1036     Clinical Impression Statement Pt continues to progress with [facility] skills with focus today on forward and lateral reaching to place clips onto yardstick.  Used light resistant yellow and red clips only as green clips irritated L LF.  Focused also on 3 point  pinch pattern grasping and placing ball pegs into pegboard with L hand, requiring min vc for prehension pattern to keep L LF from slipping from it's position.  Pt will continue to benefit from skilled OT for increasing LUE strength and maximizing coordination skills in order to increase efficiency when engaging LUE into daily tasks.      OT Occupational Profile and History Problem Focused Assessment - Including review of records relating to presenting problem    Occupational performance deficits (Please refer to evaluation for details): ADL's;IADL's;Work    Body Structure / Function / Physical Skills ADL;GMC;UE functional use;FMC;Decreased knowledge of use of DME;Pain;Strength;Coordination;Dexterity;IADL;Sensation;Balance    Rehab Potential Good    Clinical Decision Making Limited treatment options, no task modification necessary    Comorbidities Affecting Occupational Performance: May have comorbidities impacting occupational performance    Modification or Assistance to Complete Evaluation  No modification of tasks or assist necessary to complete eval    OT Frequency 1-2x / week    OT Duration 12 weeks    OT Treatment/Interventions Self-care/ADL training;Therapeutic exercise;Therapeutic activities;Cryotherapy;Moist Heat;Neuromuscular education;DME and/or AE instruction;Manual Therapy;Splinting;Patient/family education    Plan OT focus on LUE coordination/strength training and education/splinting for L hand 3rd digit PIP triggering    OT Home Exercise Plan L GMC/FMC tasks, strengthening as tolerated   Consulted and Agree with Plan of Care Patient            Leta Speller, MS, OTR/L   Darleene Cleaver, OT 11/27/2022, 9:07 AM

## 2022-11-27 NOTE — Progress Notes (Signed)
Subjective:    Patient ID: [name], female    DOB: 04/13/1956, 66 y.o.   MRN: [id number]  HPI  [name] is a 66 year old woman who presents for follow-up s/p CABG and stroke  1) Trigger finger -she had an injection in the past and this really helped -she would like a repeat injection -initially felt worse after injection, and then after a week it stopped at all.   2) CVA -She has been doing well at home but has not yet received and PT, OT, or nursing services. She says that SW is aware and working on this.  -she gets long term disability -she would like to return to work -she is interested in [place] trial  3) s/p CABG -incision wound healed  4) Anxiety -she takes Xanax.  -it is only written for 45 days.  -she was started on this when she was still working.     She has been doing her dressing changes and inspecting her wound daily  She does not need any refills of her medications.  BP is currently well controlled  Has not been walking as much as when she was in the hospital.  She has struggled a lot with her disability insurance and this has impacted her financially. She shares some of these concerns with me today    Pain Inventory Average Pain 0 Pain Right Now 0 My pain is  no pain  In the last 24 hours, has pain interfered with the following? General activity 0 Relation with others 0 Enjoyment of life 0 What TIME of day is your pain at its worst? no pain Sleep (in general) Good  Pain is worse with: no pain Pain improves with: no pain Relief from Meds: no pain  walk with assistance use a walker  disabled: date disabled short term on leave from [employer];  weakness trouble walking anxiety  Any changes since last visit?  no  Any changes since last visit?  no    Family History  Problem Relation Age of Onset   Bone cancer Brother    Aneurysm Mother        brain   Diabetes Father    CAD Father    Heart failure Father     Colon cancer Father    Cancer Father    Alzheimer's disease Paternal Grandmother    Dementia Paternal Grandmother    Mental illness Paternal Grandfather    Healthy Daughter    Healthy Son    Breast cancer Neg Hx    Social History   Socioeconomic History   Marital status: Widowed    Spouse name: Not on file   Number of children: 2   Years of education: Not on file   Highest education level: 12th grade  Occupational History    Employer: [employer]  Tobacco Use   Smoking status: Former    Packs/day: 1.50    Years: 25.00    Total pack years: 37.50    Types: Cigarettes    Quit date: 03/24/2007    Years since quitting: 15.6   Smokeless tobacco: Never  Vaping Use   Vaping Use: Never used  Substance and Sexual Activity   Alcohol use: Not Currently   Drug use: No   Sexual activity: Not Currently    Birth control/protection: Post-menopausal  Other Topics Concern   Not on file  Social History Narrative   Not on file   Social Determinants of Health   Financial  Resource Strain: Not on file  Food Insecurity: Not on file  Transportation Needs: Not on file  Physical Activity: Not on file  Stress: Not on file  Social Connections: Not on file   Past Surgical History:  Procedure Laterality Date   AORTIC VALVE REPLACEMENT N/A 08/04/2021   Procedure: RESECTION AORTIC VALVE TUMOR;  Surgeon: [name], MD;  Location: [place];  Service: Open Heart Surgery;  Laterality: N/A;   APPLICATION OF WOUND VAC N/A 08/24/2021   Procedure: APPLICATION OF WOUND VAC;  Surgeon: Gaye Pollack, MD;  Location: [place] [facility];  Service: Thoracic;  Laterality: N/A;   APPLICATION OF WOUND VAC N/A 08/28/2021   Procedure: WOUND VAC CHANGE;  Surgeon: [name], MD;  Location: [place];  Service: Thoracic;  Laterality: N/A;   BREAST EXCISIONAL BIOPSY Right 1999   neg   BUBBLE STUDY  04/24/2021   Procedure: BUBBLE STUDY;  Surgeon: Sueanne Margarita, MD;  Location: [place];  Service:  Cardiovascular;;   CATARACT EXTRACTION  12/2011   CATARACT EXTRACTION W/PHACO Left 12/02/2016   Procedure: CATARACT EXTRACTION PHACO AND INTRAOCULAR LENS PLACEMENT ([place]);  Surgeon: Estill Cotta, MD;  Location: [facility];  Service: Ophthalmology;  Laterality: Left;  [place] 2:14 AP% 28.4 CDE 59.73 Fluid pack lot # [id number]   COLONOSCOPY WITH PROPOFOL N/A 04/13/2022   Procedure: COLONOSCOPY WITH PROPOFOL;  Surgeon: Lin Landsman, MD;  Location: [facility]COPY;  Service: Gastroenterology;  Laterality: N/A;   CORONARY ARTERY BYPASS GRAFT N/A 08/04/2021   Procedure: CORONARY ARTERY BYPASS GRAFTING (CABG) X  4  USING LEFT INTERNAL MAMMARY ARTERY AND RIGHT GREATER SAPHENOUS VEIN ENDOSCOPIC CONDUITS;  Surgeon: [name], MD;  Location: [place];  Service: Open Heart Surgery;  Laterality: N/A;   DIAGNOSTIC LAPAROSCOPY     EYE SURGERY     IR CT HEAD LTD  04/19/2021   IR PERCUTANEOUS ART THROMBECTOMY/INFUSION INTRACRANIAL INC DIAG ANGIO  04/19/2021       IR PERCUTANEOUS ART THROMBECTOMY/INFUSION INTRACRANIAL INC DIAG ANGIO  04/19/2021   IR US GUIDE VASC ACCESS LEFT  04/19/2021   RADIOLOGY WITH ANESTHESIA N/A 04/19/2021   Procedure: IR WITH ANESTHESIA;  Surgeon: Luanne Bras, MD;  Location: [place];  Service: Radiology;  Laterality: N/A;   STERNAL WOUND DEBRIDEMENT N/A 08/24/2021   Procedure: STERNAL WOUND DRAINAGE AND DEBRIDEMENT;  Surgeon: Gaye Pollack, MD;  Location: [place] [facility];  Service: Thoracic;  Laterality: N/A;   STERNAL WOUND DEBRIDEMENT N/A 08/28/2021   Procedure: STERNAL WOUND DEBRIDEMENT;  Surgeon: [name], MD;  Location: [place];  Service: Thoracic;  Laterality: N/A;   TEE WITHOUT CARDIOVERSION N/A 04/24/2021   Procedure: TRANSESOPHAGEAL ECHOCARDIOGRAM (TEE);  Surgeon: Sueanne Margarita, MD;  Location: [facility] END[facility]COPY;  Service: Cardiovascular;  Laterality: N/A;   TEE WITHOUT CARDIOVERSION N/A 08/04/2021   Procedure: TRANSESOPHAGEAL ECHOCARDIOGRAM (TEE);  Surgeon:  [name], MD;  Location: [place];  Service: Open Heart Surgery;  Laterality: N/A;   TOTAL HIP ARTHROPLASTY Right 04/04/2018   Procedure: TOTAL HIP ARTHROPLASTY;  Surgeon: Dereck Leep, MD;  Location: [facility];  Service: Orthopedics;  Laterality: Right;   Past Medical History:  Diagnosis Date   Anxiety    Arthritis    CAD (coronary artery disease)    a. 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 s/p CABG x4 LIMA to LAD, SVG to PDA, and Sequential SVG to OM and Diagonal on 8/   Stroke ([place]) 04/2021   admitted at [facility] - left sided weakness   Urinary incontinence    as stated by patient   BP (!) 199/108   Pulse 80   Ht '5\' 7"'$  (1.702 m)   Wt 177 lb (80.3 kg)   SpO2 96%   BMI 27.72 kg/m   Opioid Risk Score:   Fall Risk Score:  `1  Depression screen PHQ 2/9     10/16/2022    1:18 PM 03/09/2022    1:27 PM 09/26/2021   12:10 PM 03/20/2021   10:30 AM 09/20/2020   10:22 AM 08/24/2019    2:10 PM 03/24/2018    2:57 PM  Depression screen PHQ 2/9  Decreased Interest '1 1 1 1 '$ 0 1 1  Down, Depressed, Hopeless '1 1 1 1 1 '$ 0 1  PHQ - 2 Score '2 2 2 2 1 1 2  '$ Altered sleeping 1 2 0 0 1 1 0  Tired, decreased energy '1 2 2 1 1 1 1  '$ Change in appetite '1 1 3 1 '$ 0 0 0  Feeling bad or failure about yourself  '1 2 1 '$ 0 0 0 1  Trouble concentrating 0 0 0 0 0 0 0  Moving slowly or fidgety/restless 0 0 0 1 0 0 0  Suicidal thoughts 0 1 0 0 1 0 0  PHQ-9 Score '6 10 8 5 4  3 4  '$ Difficult doing work/chores Somewhat difficult Somewhat difficult  Not difficult at all Not difficult at all Not difficult at all Somewhat difficult     Review of Systems  Constitutional:  Positive for unexpected weight change.       Wt loss  HENT: Negative.    Eyes: Negative.   Respiratory: Negative.    Cardiovascular: Negative.   Gastrointestinal: Negative.   Endocrine: Negative.   Genitourinary: Negative.   Musculoskeletal:  Positive for gait problem.  Skin: Negative.   Allergic/Immunologic: Negative.   Neurological:  Positive for weakness.  Hematological: Negative.   Psychiatric/Behavioral:  Positive for dysphoric mood. The patient is nervous/anxious.   All other systems reviewed and are negative.      Objective:   Physical Exam Gen: no distress, normal appearing, BMI 27.72, BP 199/108 HEENT: oral mucosa pink and moist, NCAT Cardio: Reg rate Chest: normal effort, normal rate of breathing Abd: soft, non-distended Ext: no edema Psych: pleasant, normal affect Skin: intact Neuro: Alert and oriented Musculoskeletal: left sided weakness, trigger finger left third digit, left hand numbness       Assessment & Plan:   1) s/p CABG -provided with handicap placard -referred to cardiology -messaged SW regarding getting her outpatient therapies -would examined- recommended continued daily wound inspection -she has struggled with getting disability payments and this has impacted her financially, discussed this with her today, provided a note that reflects all the conditions for which she  was treated while she was she was inpatient rehab as she is having to argue to get coverage for inpatient rehab as well  2) Left sided neuropathic pain -Discussed benefits of exercise in reducing pain. Recommend topical CBD oil- discussed its benefits in reducing inflammation, pain, insomnia, and anxiety.  -Discussed that CBD oil differs from marijuana in that it does not contain THC-  the substance that causes euphoria.  -Commended on stopping Gabapentin -Discussed that it is made from the hemp plant.  -It has been used for thousands of years -preliminary research suggests that if may be able to shrink cancerous tumors, stop plaque formation in Alzheimer's Disease, and slow the progress of brain disease from concussions.  -Additional benefits that have been demonstrated in studies include improved nausea, indigestion, and brain health, and reduced seizures.  -In a survey 92% of patients who tried medical cannabis felt it improved symptoms such as chronic pain, arthritis, migraines, and cancer.    -Discussed following foods that may reduce pain: 1) Ginger (especially studied for arthritis)- reduce leukotriene production to decrease inflammation 2) Blueberries- high in phytonutrients that decrease inflammation 3) Salmon- marine omega-3s reduce joint swelling and pain 4) Pumpkin seeds- reduce inflammation 5) dark chocolate- reduces inflammation 6) turmeric- reduces inflammation 7) tart cherries - reduce pain and stiffness 8) extra virgin olive oil - its compound olecanthal helps to block prostaglandins  9) chili peppers- can be eaten or applied topically via capsaicin 10) mint- helpful for headache, muscle aches, joint pain, and itching 11) garlic- reduces inflammation  Link to further information on diet for chronic pain: [email or web address]   3) HTN: -BP is very elevated today at 199/108, discussed that she did not take her medicine this morning and had anxiety coming here -Advised checking BP daily at home and logging results to bring into follow-up appointment with her PCP and myself. -Reviewed BP meds today.  -Advised regarding healthy foods that can help lower blood pressure and provided with a list: 1) citrus foods- high in vitamins and minerals 2) salmon and other fatty fish - reduces  inflammation and oxylipins 3) swiss chard (leafy green)- high level of nitrates 4) pumpkin seeds- one of the best natural sources of magnesium 5) Beans and lentils- high in fiber, magnesium, and potassium 6) Berries- high in flavonoids 7) Amaranth (whole grain, can be cooked similarly to rice and oats)- high in magnesium and fiber 8) Pistachios- even more effective at reducing BP than other nuts 9) Carrots- high in phenolic compounds that relax blood vessels and reduce inflammation 10) Celery- contain phthalides that relax tissues of arterial walls 11) Tomatoes- can also improve cholesterol and reduce risk of heart disease 12) Broccoli- good source of magnesium, calcium, and potassium 13) Greek yogurt: high in potassium and calcium 14) Herbs and spices: Celery seed, cilantro, saffron, lemongrass, black cumin, ginseng, cinnamon, cardamom, sweet basil, and ginger 15) Chia and flax seeds- also help to lower cholesterol and blood sugar 16) Beets- high levels of nitrates that relax blood vessels  17) spinach and bananas- high in potassium  -Provided lise of supplements that can help with hypertension:  1) magnesium: one high quality brand is Bioptemizers since it contains all 7 types of magnesium, otherwise over the counter magnesium gluconate '400mg'$  is a good option 2) B vitamins 3) vitamin D 4) potassium 5) CoQ10 6) L-arginine 7) Vitamin C 8) Beetroot -Educated that goal BP is 120/80. -Made goal to incorporate some of the above foods into diet.  4) Diabetes: -check CBGs daily, log, and bring log to follow-up appointment -avoid sugar, bread, pasta, rice -avoid snacking -perform daily foot exam and at least annual eye exam -try to incorporate into your diet some of the following foods which are good for diabetes: 1) cinnamon- imitates effects of insulin, increasing glucose transport into cells ([place] or [place] cinnamon is best, least processed) 2) nuts- can slow down the blood  sugar response of carbohydrate rich foods 3) oatmeal- contains and anti-inflammatory compound avenanthramide 4) whole-milk yogurt (best types are no sugar, [place] yogurt, or goat/sheep yogurt) 5) beans- high in protein, fiber, and vitamins, low glycemic index 6) broccoli- great source of vitamin A and C 7) quinoa- higher in protein and fiber than other grains 8) spinach- high in vitamin A, fiber, and protein 9) olive oil- reduces glucose levels, LDL, and triglycerides 10) salmon- excellent amount of omega-3-fatty acids 11) walnuts- rich in antioxidants 12) apples- high in fiber and quercetin 13) carrots- highly nutritious with low impact on blood sugar 14) eggs- improve HDL (good cholesterol), high in protein, keep you satiated 15) turmeric: improves blood sugars, cardiovascular disease, and protects kidney health 16) garlic: improves blood sugar, blood pressure, pain 17) tomatoes: highly nutritious with low impact on blood sugar   5) Trigger finger -referred to sports medicine  6) Left hand weakness: -referred for Vivistim  7) Anxiety: -Renewed Xanax 0.'5mg'$  BID -Discussed exercise and meditation as tools to decrease anxiety. -Recommended Down Dog Yoga app -Discussed spending time outdoors. -Discussed positive re-framing of anxiety.  -Discussed the following foods that have been show to reduce anxiety: 1) [place] nuts, mushrooms, soy beans due to their high selenium content. Upper limit of toxicity of selenium is 426mg/day so no more than 3-4 bBolivianuts per day.  2) Fatty fish such as salmon, mackerel, sardines, trout, and herring- high in omega-3 fatty acids 3) Eggs- increases serotonin and dopamine 4) Pumpkin seeds- high in omega-3 fatty acids 5) dark chocolate- high in flavanols that increase blood flow to brain 6) turmeric- take with black pepper to increase absorption 7) chamomile tea- antioxidant and anti-inflammatory properties 8) yogurt without sugar- supports gut-brain  axis 9) green tea- contains L- theanine 10) blueberries- high in vitamin C and antioxidants 11) [place] high in tryptophan which gets converted to serotonin 12) bell peppers- rich in vitamin C and antioxidants 13) citrus fruits- rich in vitamin C and antioxidants 14) almonds- high in vitamin E and healthy fats 15) chia seeds- high in omega-3 fatty acids  5) post- stroke numbness -discussed continued numbness in her face and hand  6) Urinary incontinence -prescribed myrbetriq -referred to urology

## 2022-12-01 ENCOUNTER — Ambulatory Visit: Payer: PPO

## 2022-12-03 ENCOUNTER — Ambulatory Visit: Payer: PPO

## 2022-12-03 ENCOUNTER — Ambulatory Visit: Payer: Self-pay

## 2022-12-03 NOTE — Telephone Encounter (Signed)
Summary: cough and congestion   Patient called in with cough and congestion, no appt until Jan 24. She is asking for assistance and medicine if possible       Called pt - [email or web address].

## 2022-12-03 NOTE — Telephone Encounter (Signed)
  Chief Complaint: Cold s/s Symptoms: Cough congestion, sinus drainage Frequency: today Pertinent Negatives: Patient denies fever, difficulty breathing Disposition: '[]'$ ED /'[]'$ Urgent Care (no appt availability in office) / '[]'$ Appointment(In office/virtual)/ '[]'$  [facility] [place] [facility]/ '[]'$ Home Care/ '[x]'$ Refused Recommended Disposition /'[]'$ [place] [facility]/ '[]'$  Follow-up with PCP Additional Notes: Pt would like to be seen in office for cold s/s.'    Summary: cough and congestion   Patient called in with cough and congestion, no appt until Jan 24. She is asking for assistance and medicine if possible     Reason for Disposition  Common cold with no complications  Answer Assessment - Initial Assessment Questions 1. ONSET: "When did the nasal discharge start?"      Today 2. AMOUNT: "How much discharge is there?"      Picking up 3. COUGH: "Do you have a cough?" If Yes, ask: "Describe the color of your sputum" (clear, white, yellow, green)     Cough - feels like some congestion 4. RESPIRATORY DISTRESS: "Describe your breathing."      A little 5. FEVER: "Do you have a fever?" If Yes, ask: "What is your temperature, how was it measured, and when did it start?"     no 6. SEVERITY: "Overall, how bad are you feeling right now?" (e.g., doesn't interfere with normal activities, staying home from school/work, staying in bed)      Worse  7. OTHER SYMPTOMS: "Do you have any other symptoms?" (e.g., sore throat, earache, wheezing, vomiting)     no 8. PREGNANCY: "Is there any chance you are pregnant?" "When was your last menstrual period?"  Protocols used: Common Cold-A-AH

## 2022-12-04 ENCOUNTER — Other Ambulatory Visit: Payer: Self-pay

## 2022-12-04 ENCOUNTER — Ambulatory Visit (INDEPENDENT_AMBULATORY_CARE_PROVIDER_SITE_OTHER): Payer: PPO | Admitting: Family Medicine

## 2022-12-04 ENCOUNTER — Encounter: Payer: Self-pay | Admitting: Family Medicine

## 2022-12-04 VITALS — BP 139/83 | HR 95 | Temp 98.3°F | Wt 176.8 lb

## 2022-12-04 DIAGNOSIS — U071 COVID-19: Secondary | ICD-10-CM | POA: Insufficient documentation

## 2022-12-04 DIAGNOSIS — R0981 Nasal congestion: Secondary | ICD-10-CM | POA: Diagnosis not present

## 2022-12-04 HISTORY — DX: COVID-19: U07.1

## 2022-12-04 LAB — POCT INFLUENZA A/B
Influenza A, POC: NEGATIVE
Influenza B, POC: NEGATIVE

## 2022-12-04 LAB — POC COVID19 BINAXNOW: SARS Coronavirus 2 Ag: POSITIVE — AB

## 2022-12-04 MED ORDER — NIRMATRELVIR/RITONAVIR (PAXLOVID) TABLET (RENAL DOSING)
2.0000 | ORAL_TABLET | Freq: Two times a day (BID) | ORAL | 0 refills | Status: AC
  Filled 2022-12-04: qty 20, 5d supply, fill #0

## 2022-12-04 NOTE — Assessment & Plan Note (Signed)
Acute, x1 day No known sick contacts Endorses cough, sputum, sneezing

## 2022-12-04 NOTE — Assessment & Plan Note (Signed)
Covid + with 1 day of symptoms; recommend 4 additional days of quarantine and 5 additional days of masking Renal dose antiviral to assist RTC if needed Continue supportive care measures with OTC medications, PO intake and bland diet

## 2022-12-04 NOTE — Progress Notes (Signed)
I,Connie R Striblin,acting as a Education administrator for Gwyneth Sprout, FNP.,have documented all relevant documentation on the behalf of Gwyneth Sprout, FNP,as directed by  Gwyneth Sprout, FNP while in the presence of Gwyneth Sprout, FNP.   Established patient visit   Patient: [name]   DOB: 08/25/1956   66 y.o. Female  MRN: [id number] Visit Date: 12/04/2022  Today's healthcare provider: Gwyneth Sprout, FNP  Re Introduced to nurse practitioner role and practice setting.  All questions answered.  Discussed provider/patient relationship and expectations.  Subjective    HPI  Cough: Patient complains of cough. Symptoms began 1 day ago. Cough described as productive of green/yellow sputum, worsening over time. Patient denies fever. Associated symptoms include nasal congestion and sneezing.  Patient denies have tobacco smoke exposure.  Pt has no known exposure to Covid or the Flu  Medications: Outpatient Medications Prior to Visit  Medication Sig   acetaminophen (TYLENOL) 325 MG tablet Take 2 tablets (650 mg total) by mouth every 4 (four) hours as needed for mild pain (or temp > 37.5 C (99.5 F)).   albuterol (VENTOLIN HFA) 108 (90 Base) MCG/ACT inhaler Inhale 2 puffs into the lungs every 6 (six) hours as needed for wheezing or shortness of breath.   ALPRAZolam (XANAX) 0.5 MG tablet Take 1 tablet (0.5 mg total) by mouth 2 (two) times daily as needed for anxiety   amLODipine (NORVASC) 10 MG tablet Take 1 tablet (10 mg total) by mouth daily.   aspirin 81 MG EC tablet Take 1 tablet (81 mg total) by mouth daily. Swallow whole.   Blood Glucose Monitoring Suppl (FREESTYLE FREEDOM LITE) w/Device KIT USE AS DIRECTED   cetirizine (ZYRTEC) 10 MG tablet Take 1 tablet (10 mg total) by mouth daily.   empagliflozin (JARDIANCE) 10 MG TABS tablet Take 1 tablet (10 mg total) by mouth daily before breakfast.   gabapentin (NEURONTIN) 300 MG capsule Take 1-2 capsules (300-600 mg total) by mouth at bedtime.   glucose  blood (FREESTYLE LITE) test strip Also needs Lancets. Use to check blood sugar up to four times a day for insulin dependant diabetes   glyBURIDE (DIABETA) 5 MG tablet Take 2 tablets (10 mg total) by mouth 2 (two) times daily with a meal.   guaiFENesin-dextromethorphan (ROBITUSSIN DM) 100-10 MG/5ML syrup Take 5 mLs by mouth every 4 (four) hours as needed for cough.   latanoprost (XALATAN) 0.005 % ophthalmic solution Place 1 drop into the left eye at bedtime.   losartan (COZAAR) 100 MG tablet Take 1 tablet (100 mg total) by mouth daily.   metFORMIN (GLUCOPHAGE-XR) 500 MG 24 hr tablet Take 2 tablets (1,000 mg total) by mouth 2 (two) times daily with a meal.   mirabegron ER (MYRBETRIQ) 25 MG TB24 tablet Take 1 tablet (25 mg total) by mouth daily.   montelukast (SINGULAIR) 10 MG tablet Take 1 tablet (10 mg total) by mouth at bedtime.   Multiple Vitamin (MULTIVITAMIN) tablet Take 1 tablet by mouth daily.   rosuvastatin (CRESTOR) 40 MG tablet Take 1 tablet (40 mg total) by mouth daily.   sertraline (ZOLOFT) 25 MG tablet Take 1 tablet by mouth at bedtime.   Vitamin D, Ergocalciferol, (DRISDOL) 1.25 MG (50000 UNIT) CAPS capsule Take 1 capsule (50,000 Units total) by mouth every 7 (seven) days.   No facility-administered medications prior to visit.    Review of Systems    Objective    BP 139/83 (BP Location: Left Arm, Patient Position:  Sitting, Cuff Size: Normal)   Pulse 95   Temp 98.3 F (36.8 C) (Oral)   Wt 176 lb 12.8 oz (80.2 kg)   SpO2 97%   BMI 27.69 kg/m   Physical Exam Vitals and nursing note reviewed.  Constitutional:      General: She is not in acute distress.    Appearance: Normal appearance. She is overweight. She is ill-appearing. She is not toxic-appearing or diaphoretic.  HENT:     Head: Normocephalic and atraumatic.     Right Ear: Tympanic membrane, ear canal and external ear normal.     Left Ear: Tympanic membrane, ear canal and external ear normal.     Nose: Congestion  present.     Mouth/Throat:     Mouth: Mucous membranes are moist.     Pharynx: Oropharynx is clear. No oropharyngeal exudate or posterior oropharyngeal erythema.  Eyes:     Extraocular Movements: Extraocular movements intact.     Conjunctiva/sclera: Conjunctivae normal.     Pupils: Pupils are equal, round, and reactive to light.  Cardiovascular:     Rate and Rhythm: Normal rate and regular rhythm.     Pulses: Normal pulses.     Heart sounds: Normal heart sounds. No murmur heard.    No friction rub. No gallop.  Pulmonary:     Effort: Pulmonary effort is normal. No respiratory distress.     Breath sounds: Normal breath sounds. No stridor. No wheezing, rhonchi or rales.  Chest:     Chest wall: No tenderness.  Musculoskeletal:        General: No swelling, tenderness, deformity or signs of injury. Normal range of motion.     Right lower leg: No edema.     Left lower leg: No edema.  Skin:    General: Skin is warm and dry.     Capillary Refill: Capillary refill takes less than 2 seconds.     Coloration: Skin is not jaundiced or pale.     Findings: No bruising, erythema, lesion or rash.  Neurological:     General: No focal deficit present.     Mental Status: She is alert and oriented to person, place, and time. Mental status is at baseline.     Cranial Nerves: No cranial nerve deficit.     Sensory: No sensory deficit.     Motor: No weakness.     Coordination: Coordination normal.     Gait: Gait abnormal.     Comments: Use of cane; improving with PT  Psychiatric:        Mood and Affect: Mood normal.        Behavior: Behavior normal.        Thought Content: Thought content normal.        Judgment: Judgment normal.     Results for orders placed or performed in visit on 12/04/22  POCT Influenza A/B  Result Value Ref Range   Influenza A, POC Negative Negative   Influenza B, POC Negative Negative  POC COVID-19  Result Value Ref Range   SARS Coronavirus 2 Ag Positive (A) Negative     Assessment & Plan     Problem List Items Addressed This Visit       Respiratory   Congestion of nasal sinus - Primary    Acute, x1 day No known sick contacts Endorses cough, sputum, sneezing      Relevant Orders   POCT Influenza A/B (Completed)   POC COVID-19 (Completed)     Other  COVID    Covid + with 1 day of symptoms; recommend 4 additional days of quarantine and 5 additional days of masking Renal dose antiviral to assist RTC if needed Continue supportive care measures with OTC medications, PO intake and bland diet      Relevant Medications   nirmatrelvir/ritonavir, renal dosing, (PAXLOVID) 10 x 150 MG & 10 x 100MG TABS   Return if symptoms worsen or fail to improve.     Vonna Kotyk, FNP, have reviewed all documentation for this visit. The documentation on 12/04/22 for the exam, diagnosis, procedures, and orders are all accurate and complete.  Gwyneth Sprout, [place] [phone] (phone) [phone] (fax)  [place]

## 2022-12-08 ENCOUNTER — Ambulatory Visit: Payer: PPO

## 2022-12-08 ENCOUNTER — Other Ambulatory Visit: Payer: Self-pay

## 2022-12-10 ENCOUNTER — Ambulatory Visit: Payer: PPO

## 2022-12-10 DIAGNOSIS — M6281 Muscle weakness (generalized): Secondary | ICD-10-CM | POA: Diagnosis not present

## 2022-12-10 DIAGNOSIS — R278 Other lack of coordination: Secondary | ICD-10-CM

## 2022-12-10 DIAGNOSIS — I6381 Other cerebral infarction due to occlusion or stenosis of small artery: Secondary | ICD-10-CM

## 2022-12-11 NOTE — Therapy (Signed)
OUTPATIENT OCCUPATIONAL THERAPY NEURO TREATMENT NOTE    Patient Name: [name] MRN: [id number] DOB:[birth year], 66 y.o., female Today's Date: 12/11/2022  PCP: Dr. Birdie Sons REFERRING PROVIDER: Frann Rider, NP   OT End of Session - 12/11/22 1218     Visit Number 35    Number of Visits 21    Date for OT Re-Evaluation 02/04/23    Authorization Time Period Reporting period beginning 11/12/22    OT Start Time 1107    OT Stop Time 1145    OT Time Calculation (min) 38 min    Equipment Utilized During Treatment [facility]    Activity Tolerance Patient tolerated treatment well    Behavior During Therapy [facility] for tasks assessed/performed               Past Medical History:  Diagnosis Date   Anxiety    Arthritis    CAD (coronary artery disease)    a. 3 vessel CAD noted on coronary CTA 04/2021, s/p CABG x4   Cataract    left   Depression    Diabetes mellitus without complication ([place])    type 2   Environmental and seasonal allergies    GERD (gastroesophageal reflux disease)    Hyperlipidemia    Hypertension    Joint pain    as reported by patient   Papillary fibroelastoma of heart    of the aortic valve,  s/p AVR and tumor resection on 08/04/2021   Right thalamic infarction ([place]) 04/29/2021   S/P AVR (aortic valve replacement)    s/p AVR with resection of AV fibroelastoma on 08/04/2021   S/P CABG x 4    a. s/p CABG x4 LIMA to LAD, SVG to PDA, and Sequential SVG to OM and Diagonal on 8/   Stroke ([place]) 04/2021   admitted at [facility] - left sided weakness   Urinary incontinence    as stated by patient   Past Surgical History:  Procedure Laterality Date   AORTIC VALVE REPLACEMENT N/A 08/04/2021   Procedure: RESECTION AORTIC VALVE TUMOR;  Surgeon: [name], MD;  Location: [place];  Service: Open Heart Surgery;  Laterality: N/A;   APPLICATION OF WOUND VAC N/A 08/24/2021   Procedure: APPLICATION OF WOUND VAC;  Surgeon: Gaye Pollack, MD;  Location:  [place] OR;  Service: Thoracic;  Laterality: N/A;   APPLICATION OF WOUND VAC N/A 08/28/2021   Procedure: WOUND VAC CHANGE;  Surgeon: [name], MD;  Location: [place];  Service: Thoracic;  Laterality: N/A;   BREAST EXCISIONAL BIOPSY Right 1999   neg   BUBBLE STUDY  04/24/2021   Procedure: BUBBLE STUDY;  Surgeon: Sueanne Margarita, MD;  Location: [place];  Service: Cardiovascular;;   CATARACT EXTRACTION  12/2011   CATARACT EXTRACTION W/PHACO Left 12/02/2016   Procedure: CATARACT EXTRACTION PHACO AND INTRAOCULAR LENS PLACEMENT ([place]);  Surgeon: Estill Cotta, MD;  Location: [facility] ORS;  Service: Ophthalmology;  Laterality: Left;  [place] 2:14 AP% 28.4 CDE 59.73 Fluid pack lot # [id number]   COLONOSCOPY WITH PROPOFOL N/A 04/13/2022   Procedure: COLONOSCOPY WITH PROPOFOL;  Surgeon: Lin Landsman, MD;  Location: [facility] ENDOSCOPY;  Service: Gastroenterology;  Laterality: N/A;   CORONARY ARTERY BYPASS GRAFT N/A 08/04/2021   Procedure: CORONARY ARTERY BYPASS GRAFTING (CABG) X  4  USING LEFT INTERNAL MAMMARY ARTERY AND RIGHT GREATER SAPHENOUS VEIN ENDOSCOPIC CONDUITS;  Surgeon: [name], MD;  Location: [place];  Service: Open Heart Surgery;  Laterality: N/A;  DIAGNOSTIC LAPAROSCOPY     EYE SURGERY     IR CT HEAD LTD  04/19/2021   IR PERCUTANEOUS ART THROMBECTOMY/INFUSION INTRACRANIAL INC DIAG ANGIO  04/19/2021       IR PERCUTANEOUS ART THROMBECTOMY/INFUSION INTRACRANIAL INC DIAG ANGIO  04/19/2021   IR US GUIDE VASC ACCESS LEFT  04/19/2021   RADIOLOGY WITH ANESTHESIA N/A 04/19/2021   Procedure: IR WITH ANESTHESIA;  Surgeon: Luanne Bras, MD;  Location: [place];  Service: Radiology;  Laterality: N/A;   STERNAL WOUND DEBRIDEMENT N/A 08/24/2021   Procedure: STERNAL WOUND DRAINAGE AND DEBRIDEMENT;  Surgeon: Gaye Pollack, MD;  Location: [place] [facility];  Service: Thoracic;  Laterality: N/A;   STERNAL WOUND DEBRIDEMENT N/A 08/28/2021   Procedure: STERNAL WOUND DEBRIDEMENT;  Surgeon:  [name], MD;  Location: [place];  Service: Thoracic;  Laterality: N/A;   TEE WITHOUT CARDIOVERSION N/A 04/24/2021   Procedure: TRANSESOPHAGEAL ECHOCARDIOGRAM (TEE);  Surgeon: Sueanne Margarita, MD;  Location: [facility] ENDOSCOPY;  Service: Cardiovascular;  Laterality: N/A;   TEE WITHOUT CARDIOVERSION N/A 08/04/2021   Procedure: TRANSESOPHAGEAL ECHOCARDIOGRAM (TEE);  Surgeon: [name], MD;  Location: [place];  Service: Open Heart Surgery;  Laterality: N/A;   TOTAL HIP ARTHROPLASTY Right 04/04/2018   Procedure: TOTAL HIP ARTHROPLASTY;  Surgeon: Dereck Leep, MD;  Location: [facility] ORS;  Service: Orthopedics;  Laterality: Right;   Patient Active Problem List   Diagnosis Date Noted   Congestion of nasal sinus 12/04/2022   COVID 12/04/2022   Excessive cerumen in both ear canals 10/16/2022   Hyperlipidemia associated with type 2 diabetes mellitus ([place]) 10/16/2022   Hypertension associated with diabetes ([place]) 10/16/2022   Type 2 diabetes mellitus with stage 2 chronic kidney disease, without long-term current use of insulin ([place]) 10/16/2022   Colon cancer screening    Subcutaneous nodule    Diabetic peripheral neuropathy ([place])    Debility 08/11/2021   S/P CABG x 4 08/04/2021   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   Hyperkalemia    Chronic bilateral low back pain without sciatica    Leukocytosis    Hyponatremia    Slow transit constipation    Hemiparesis affecting left side as late effect of stroke ([place])    History of CVA with residual deficit 04/19/2021   Dyslipidemia, goal LDL below 70 04/18/2021   Recurrent major depressive disorder, in partial remission ([place]) 09/20/2020   Status post total replacement of hip 04/04/2018   Primary osteoarthritis of right hip 02/20/2018   Allergic rhinitis 10/28/2015   Acute stress disorder 08/01/2015   Anxiety 08/01/2015   Clinical depression 08/01/2015   Diabetes mellitus with coincident hypertension ([place]) 08/01/2015    Diverticulosis of colon 08/01/2015   Generalized pruritus 08/01/2015   Cannot sleep 08/01/2015   Adiposity 08/01/2015   Detrusor muscle hypertonia 08/01/2015   Hypercholesterolemia without hypertriglyceridemia 08/01/2015   Female stress incontinence 08/01/2015   Avitaminosis D 08/01/2015    ONSET DATE: 04/18/2021  REFERRING DIAG: hemiparesis affecting L side as late effect of CVA; dsymetria  THERAPY DIAG:  Muscle weakness (generalized)  Other lack of coordination  Right thalamic infarction [facility])  Rationale for Evaluation and Treatment Rehabilitation  PERTINENT HISTORY: Pt s/p R CVA on 04/18/21. Pt was initially recovering very well from a stroke standpoint with mild LUE >LLE numbness and ambulating with a cane. As recovering well, on 08/04/2021 underwent CABG x4 and underwent resection of aortic valve mass most likely papillary fibroblastoma as well as endoscopic harvest of greater saphenous vein from  right leg. Therapy eval's recommended CIR for decreased functional mobility - d/c to CIR on 8/29. She required incision debridement of sternal wound and application of VAC eventually discontinued on 9/29. She was discharged home on 9/30 (after a 32 day stay).  PRECAUTIONS: Fall  SUBJECTIVE: Pt back today after being out for a respiratory illness last week.  Pt reports she's feeling better but still feels quite weak.   PAIN:  Are you having pain? 0 pain   OBJECTIVE:    [facility] OT Assessment - 07/01/22 0001       07/01/22 08/18/22 08/27/22 11/12/22            Observation/Other Assessments       Focus on Therapeutic Outcomes (FOTO)  61  59 NT 54 (score did not convey improvement, but pt acknowledges using LUE better and more consistently with ADLs)            Coordination       Gross Motor Movements are Fluid and Coordinated No  No (improving) No (improving) No (improving)    Fine Motor Movements are Fluid and Coordinated No  No (improving) No (improving) No (not improved from last  assessment); pt has had several missed appointments since 08/27/22 d/t grandson being in the hospital.    Right 9 Hole Peg Test 31 sec  NT NT NT    Left 9 Hole Peg Test 3 min 44 sec   improved from 5 min 38 sec 2 min 50 sec (improved from 3 min 44 sec) 2 min 9 sec  2 min 16 sec            Hand Function       Right Hand Grip (lbs) 35  NT NT     Right Hand Lateral Pinch 10 lbs  NT NT     Right Hand 3 Point Pinch 7 lbs  NT NT     Left Hand Grip (lbs) 10   decline (L LF pain from triggering limiting grip strength) 23# 25# 29#    Left Hand Lateral Pinch 6 lbs  8# 9# 10#    Left 3 point pinch 5 lbs   finger slipping 8# 6# (finger slipping) 8# (finger slipping)        TODAY'S TREATMENT:   Moist heat not available today d/t equipment malfunction.      Neuro re-ed: Facilitated L GMC/FMC skills working to place and [employer] discs into grid at table top level using digits 1-3.  Pt then practiced pushing discs through the grid holes with grid positioned upright on table top to facilitate a forward reach, then a lateral reach placed off center to pt's L, still table top level.  Practiced smaller item pick up and placement of ball pegs into pegboard using L hand.  Practiced gathering up to 3 in hand and discarding selected colors moving pegs from palm to fingertips without dropping the others.   PATIENT EDUCATION:  Education details: Continue to limit repetitive flexion of L 3rd digit and wear splint with activity to prevent triggering.  Person educated: Patient Education method: Explanation and Verbal cues Education comprehension: verbalized understanding, returned demonstration, and needs further education   HOME EXERCISE PROGRAM FMC/GMC activities for LUE    OT Short Term Goals -        OT SHORT TERM GOAL #1   Title Pt will be indep to perform LUE HEP for coordination and strengthening.    Baseline Eval: not yet initiated; 06/30/22: pt remains  consistent with HEP and avoiding repetitive  gripping that causes pain in L LF; 08/18/22: pt remains consistent to engage LUE; HEP ongoing as function improves; 08/27/22: same as 08/18/22; 11/12/22: indep; pt regularly engages L hand into daily tasks and knows to maintain low reps with putty to prevent triggering of L LF PIP joint.    Time 6    Period Weeks    Status achieved   Target Date 09/29/22     OT SHORT TERM GOAL #2   Title Pt to be indep with joint protection and pain management strategies to limit triggering of L hand 3rd digit PIP.    Baseline Eval: Trigger finger splint recommended, initiated joint protection educ; 06/30/22: pt is consistent to wear her trigger finger splint and avoids repetitive gripping which causes pain to L LF PIP joint, though pt is still experiencing moderate pain in this joint with occasional triggering.  Pt plans to follow up with MD next week; 08/18/22: pt is indep with joint protection, though she states triggering has worsened since her cortisone injection, yet her pain is better.  Possibility that pt is using her hand more d/t less pain and OT reminded pt to limit repetition with gripping; 08/27/22: same as 08/18/22; 11/12/22: pt is indep with joint protection and states L LF has not triggered lately   Time 6    Period Weeks    Status achieved   Target Date 09/29/22              OT Long Term Goals -  02/04/23      OT LONG TERM GOAL #1   Title Pt will increase FOTO by 1-2 points to indicate improvement in perceived functional indep with daily tasks.    Baseline Eval: FOTO 60; 06/30/22: 61; 08/18/22: 59; 08/27/22: NT this date; 11/12/22: 54   Time 12    Period Weeks    Status ongoing   Target Date 02/04/23     OT LONG TERM GOAL #2   Title Pt will demonstrate increased L FMC/GMC to enable indep with putting hair in ponytail.    Baseline Eval: unable; 06/30/22: pt can hold her hair in a ponytail with BUEs but can not yet manage the rubber band; 08/18/22: pt can tie hair in ponytail   Time 12    Period Weeks     Status Achieved    Target Date 08/10/22      OT LONG TERM GOAL #3   Title Pt will increase L FMC to enable indep with tying shoe laces.    Baseline Eval: unable to tie laces; wears slip on shoes; 06/30/22: pt still wearing slip on shoes; 08/18/22: pt stated she tied her laces the other day, but it took her 4 trials; 08/27/22: same as 08/18/22; 11/12/22: requires multiple trials    Time 12    Period Weeks    Status ongoing   Target Date 02/04/23     OT LONG TERM GOAL #4   Title Pt will increase L hand dexterity skills to enable pt to pick up coins from table with L hand.    Baseline Eval: unable; 06/30/22: pt can pick up coins from table with extra time.  Dexterity skills improving as noted by improved 9 hole peg test from 5 min 38 sec to 3 min 44 sec on the L; 08/18/22: Pt picks up coins from table with mild-moderate difficulty, L 9 hole 2 min 50 sec; 08/27/22: L 9 hole 2 min 9 secs; 11/12/22:  2 min 16 sec (mild-mod difficulty with picking up coins from a table)   Time 12    Period Weeks    Status ongoing   Target Date 02/04/23     OT LONG TERM GOAL #5   Title Pt will increase L grip strength by 5 or more lbs to improve ability to hold and carry ADL supplies in L hand.    Baseline Eval: L grip 17# (R 35#); 06/30/22: L grip 10# (pain in L LF significantly limiting grip as finger continues to trigger; 08/18/22: L grip 23#; 08/27/22: L grip 25#; 02/04/23: L grip 29#   Time 12    Period Weeks    Status ongoing   Target Date 02/04/23            Plan - 06/02/22 1036     Clinical Impression Statement Pt continues to progress with [facility] skills with focus today on forward and lateral reaching to place [place] discs through grid holes.  Pt is improving with maintaining L LF in a 3 point pinch patterns when picking up and placing ball pegs into pegboard.  Pt was also able to pick up pegs from table top without use of non-skid surface this date.  Pt is improving with translatory skills, noting few dropped  ball pegs when moving pegs from palm to fingertips.  Pt will continue to benefit from skilled OT for increasing LUE strength and maximizing coordination skills in order to increase efficiency when engaging LUE into daily tasks.      OT Occupational Profile and History Problem Focused Assessment - Including review of records relating to presenting problem    Occupational performance deficits (Please refer to evaluation for details): ADL's;IADL's;Work    Body Structure / Function / Physical Skills ADL;GMC;UE functional use;FMC;Decreased knowledge of use of DME;Pain;Strength;Coordination;Dexterity;IADL;Sensation;Balance    Rehab Potential Good    Clinical Decision Making Limited treatment options, no task modification necessary    Comorbidities Affecting Occupational Performance: May have comorbidities impacting occupational performance    Modification or Assistance to Complete Evaluation  No modification of tasks or assist necessary to complete eval    OT Frequency 1-2x / week    OT Duration 12 weeks    OT Treatment/Interventions Self-care/ADL training;Therapeutic exercise;Therapeutic activities;Cryotherapy;Moist Heat;Neuromuscular education;DME and/or AE instruction;Manual Therapy;Splinting;Patient/family education    Plan OT focus on LUE coordination/strength training and education/splinting for L hand 3rd digit PIP triggering    OT Home Exercise Plan L GMC/FMC tasks, strengthening as tolerated   Consulted and Agree with Plan of Care Patient            Leta Speller, MS, OTR/L   Darleene Cleaver, OT 12/11/2022, 12:20 PM

## 2022-12-14 ENCOUNTER — Telehealth: Payer: Self-pay | Admitting: Family Medicine

## 2022-12-14 NOTE — Telephone Encounter (Signed)
Please advise patient is due for follow up office visit since change in diabetes and cholesterol medication in November. Need to schedule appointment sometime in next 1-2 weeks. Can use same day slot if that's all that is available.

## 2022-12-15 ENCOUNTER — Ambulatory Visit: Payer: PPO | Attending: Adult Health

## 2022-12-15 ENCOUNTER — Ambulatory Visit: Payer: Self-pay

## 2022-12-15 DIAGNOSIS — M6281 Muscle weakness (generalized): Secondary | ICD-10-CM | POA: Insufficient documentation

## 2022-12-15 DIAGNOSIS — I6381 Other cerebral infarction due to occlusion or stenosis of small artery: Secondary | ICD-10-CM | POA: Diagnosis not present

## 2022-12-15 DIAGNOSIS — R278 Other lack of coordination: Secondary | ICD-10-CM | POA: Insufficient documentation

## 2022-12-15 NOTE — Telephone Encounter (Signed)
MyChart message sent. CRM created if patient contacts office

## 2022-12-16 ENCOUNTER — Ambulatory Visit: Payer: Self-pay | Admitting: Internal Medicine

## 2022-12-16 NOTE — Telephone Encounter (Signed)
Appt 12/25/22.

## 2022-12-17 ENCOUNTER — Ambulatory Visit: Payer: PPO

## 2022-12-17 ENCOUNTER — Ambulatory Visit: Payer: Self-pay

## 2022-12-17 NOTE — Therapy (Signed)
OUTPATIENT OCCUPATIONAL THERAPY NEURO TREATMENT NOTE    Patient Name: [name] MRN: [id number] DOB:Sep 22, 1956, 67 y.o., female Today's Date: 12/17/2022  PCP: Dr. Birdie Sons REFERRING PROVIDER: Frann Rider, NP   OT End of Session - 12/17/22 0837     Visit Number 36    Number of Visits 45    Date for OT Re-Evaluation 02/04/23    Authorization Time Period Reporting period beginning 11/12/22    OT Start Time 1648    OT Stop Time 1730    OT Time Calculation (min) 42 min    Equipment Utilized During Treatment [facility]    Activity Tolerance Patient tolerated treatment well    Behavior During Therapy [facility] for tasks assessed/performed               Past Medical History:  Diagnosis Date   Anxiety    Arthritis    CAD (coronary artery disease)    a. 3 vessel CAD noted on coronary CTA 04/2021, s/p CABG x4   Cataract    left   Depression    Diabetes mellitus without complication ([place])    type 2   Environmental and seasonal allergies    GERD (gastroesophageal reflux disease)    Hyperlipidemia    Hypertension    Joint pain    as reported by patient   Papillary fibroelastoma of heart    of the aortic valve,  s/p AVR and tumor resection on 08/04/2021   Right thalamic infarction ([place]) 04/29/2021   S/P AVR (aortic valve replacement)    s/p AVR with resection of AV fibroelastoma on 08/04/2021   S/P CABG x 4    a. s/p CABG x4 LIMA to LAD, SVG to PDA, and Sequential SVG to OM and Diagonal on 8/   Stroke ([place]) 04/2021   admitted at [facility] - left sided weakness   Urinary incontinence    as stated by patient   Past Surgical History:  Procedure Laterality Date   AORTIC VALVE REPLACEMENT N/A 08/04/2021   Procedure: RESECTION AORTIC VALVE TUMOR;  Surgeon: [name], MD;  Location: [place];  Service: Open Heart Surgery;  Laterality: N/A;   APPLICATION OF WOUND VAC N/A 08/24/2021   Procedure: APPLICATION OF WOUND VAC;  Surgeon: Gaye Pollack, MD;  Location: [place]  OR;  Service: Thoracic;  Laterality: N/A;   APPLICATION OF WOUND VAC N/A 08/28/2021   Procedure: WOUND VAC CHANGE;  Surgeon: [name], MD;  Location: [place];  Service: Thoracic;  Laterality: N/A;   BREAST EXCISIONAL BIOPSY Right 1999   neg   BUBBLE STUDY  04/24/2021   Procedure: BUBBLE STUDY;  Surgeon: Sueanne Margarita, MD;  Location: [place];  Service: Cardiovascular;;   CATARACT EXTRACTION  12/2011   CATARACT EXTRACTION W/PHACO Left 12/02/2016   Procedure: CATARACT EXTRACTION PHACO AND INTRAOCULAR LENS PLACEMENT ([place]);  Surgeon: Estill Cotta, MD;  Location: [facility] ORS;  Service: Ophthalmology;  Laterality: Left;  [place] 2:14 AP% 28.4 CDE 59.73 Fluid pack lot # [id number]   COLONOSCOPY WITH PROPOFOL N/A 04/13/2022   Procedure: COLONOSCOPY WITH PROPOFOL;  Surgeon: Lin Landsman, MD;  Location: [facility] ENDOSCOPY;  Service: Gastroenterology;  Laterality: N/A;   CORONARY ARTERY BYPASS GRAFT N/A 08/04/2021   Procedure: CORONARY ARTERY BYPASS GRAFTING (CABG) X  4  USING LEFT INTERNAL MAMMARY ARTERY AND RIGHT GREATER SAPHENOUS VEIN ENDOSCOPIC CONDUITS;  Surgeon: [name], MD;  Location: [place];  Service: Open Heart Surgery;  Laterality: N/A;  DIAGNOSTIC LAPAROSCOPY     EYE SURGERY     IR CT HEAD LTD  04/19/2021   IR PERCUTANEOUS ART THROMBECTOMY/INFUSION INTRACRANIAL INC DIAG ANGIO  04/19/2021       IR PERCUTANEOUS ART THROMBECTOMY/INFUSION INTRACRANIAL INC DIAG ANGIO  04/19/2021   IR US GUIDE VASC ACCESS LEFT  04/19/2021   RADIOLOGY WITH ANESTHESIA N/A 04/19/2021   Procedure: IR WITH ANESTHESIA;  Surgeon: Luanne Bras, MD;  Location: [place];  Service: Radiology;  Laterality: N/A;   STERNAL WOUND DEBRIDEMENT N/A 08/24/2021   Procedure: STERNAL WOUND DRAINAGE AND DEBRIDEMENT;  Surgeon: Gaye Pollack, MD;  Location: [place] OR;  Service: Thoracic;  Laterality: N/A;   STERNAL WOUND DEBRIDEMENT N/A 08/28/2021   Procedure: STERNAL WOUND DEBRIDEMENT;  Surgeon:  [name], MD;  Location: [place];  Service: Thoracic;  Laterality: N/A;   TEE WITHOUT CARDIOVERSION N/A 04/24/2021   Procedure: TRANSESOPHAGEAL ECHOCARDIOGRAM (TEE);  Surgeon: Sueanne Margarita, MD;  Location: [facility] ENDOSCOPY;  Service: Cardiovascular;  Laterality: N/A;   TEE WITHOUT CARDIOVERSION N/A 08/04/2021   Procedure: TRANSESOPHAGEAL ECHOCARDIOGRAM (TEE);  Surgeon: [name], MD;  Location: [place];  Service: Open Heart Surgery;  Laterality: N/A;   TOTAL HIP ARTHROPLASTY Right 04/04/2018   Procedure: TOTAL HIP ARTHROPLASTY;  Surgeon: Dereck Leep, MD;  Location: [facility] ORS;  Service: Orthopedics;  Laterality: Right;   Patient Active Problem List   Diagnosis Date Noted   Congestion of nasal sinus 12/04/2022   COVID 12/04/2022   Excessive cerumen in both ear canals 10/16/2022   Hyperlipidemia associated with type 2 diabetes mellitus ([place]) 10/16/2022   Hypertension associated with diabetes ([place]) 10/16/2022   Type 2 diabetes mellitus with stage 2 chronic kidney disease, without long-term current use of insulin ([place]) 10/16/2022   Colon cancer screening    Subcutaneous nodule    Diabetic peripheral neuropathy ([place])    Debility 08/11/2021   S/P CABG x 4 08/04/2021   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   Hyperkalemia    Chronic bilateral low back pain without sciatica    Leukocytosis    Hyponatremia    Slow transit constipation    Hemiparesis affecting left side as late effect of stroke ([place])    History of CVA with residual deficit 04/19/2021   Dyslipidemia, goal LDL below 70 04/18/2021   Recurrent major depressive disorder, in partial remission ([place]) 09/20/2020   Status post total replacement of hip 04/04/2018   Primary osteoarthritis of right hip 02/20/2018   Allergic rhinitis 10/28/2015   Acute stress disorder 08/01/2015   Anxiety 08/01/2015   Clinical depression 08/01/2015   Diabetes mellitus with coincident hypertension ([place]) 08/01/2015    Diverticulosis of colon 08/01/2015   Generalized pruritus 08/01/2015   Cannot sleep 08/01/2015   Adiposity 08/01/2015   Detrusor muscle hypertonia 08/01/2015   Hypercholesterolemia without hypertriglyceridemia 08/01/2015   Female stress incontinence 08/01/2015   Avitaminosis D 08/01/2015    ONSET DATE: 04/18/2021  REFERRING DIAG: hemiparesis affecting L side as late effect of CVA; dsymetria  THERAPY DIAG:  Muscle weakness (generalized)  Other lack of coordination  Right thalamic infarction [facility])  Rationale for Evaluation and Treatment Rehabilitation  PERTINENT HISTORY: Pt s/p R CVA on 04/18/21. Pt was initially recovering very well from a stroke standpoint with mild LUE >LLE numbness and ambulating with a cane. As recovering well, on 08/04/2021 underwent CABG x4 and underwent resection of aortic valve mass most likely papillary fibroblastoma as well as endoscopic harvest of greater saphenous vein from  right leg. Therapy eval's recommended CIR for decreased functional mobility - d/c to CIR on 8/29. She required incision debridement of sternal wound and application of VAC eventually discontinued on 9/29. She was discharged home on 9/30 (after a 32 day stay).  PRECAUTIONS: Fall  SUBJECTIVE: Pt reports doing well today.  Pt reports she's still feeling weak but gradually feeling better.  PAIN:  Are you having pain? 0 pain   OBJECTIVE:    [facility] OT Assessment - 07/01/22 0001       07/01/22 08/18/22 08/27/22 11/12/22            Observation/Other Assessments       Focus on Therapeutic Outcomes (FOTO)  61  59 NT 54 (score did not convey improvement, but pt acknowledges using LUE better and more consistently with ADLs)            Coordination       Gross Motor Movements are Fluid and Coordinated No  No (improving) No (improving) No (improving)    Fine Motor Movements are Fluid and Coordinated No  No (improving) No (improving) No (not improved from last assessment); pt has had several missed  appointments since 08/27/22 d/t grandson being in the hospital.    Right 9 Hole Peg Test 31 sec  NT NT NT    Left 9 Hole Peg Test 3 min 44 sec   improved from 5 min 38 sec 2 min 50 sec (improved from 3 min 44 sec) 2 min 9 sec  2 min 16 sec            Hand Function       Right Hand Grip (lbs) 35  NT NT     Right Hand Lateral Pinch 10 lbs  NT NT     Right Hand 3 Point Pinch 7 lbs  NT NT     Left Hand Grip (lbs) 10   decline (L LF pain from triggering limiting grip strength) 23# 25# 29#    Left Hand Lateral Pinch 6 lbs  8# 9# 10#    Left 3 point pinch 5 lbs   finger slipping 8# 6# (finger slipping) 8# (finger slipping)        TODAY'S TREATMENT:   Moist heat not available today d/t equipment malfunction.    Therapeutic Exercise: Facilitated pinch strengthening with use of therapy resistant clothespins to target 3 point pinch of L hand.  Used yellow and red clips only to maintain low resistance to avoid pain in L LF PIP.  Practiced picking up clips from table top and clipping onto vertical dowel, then practiced quick/alternating movements clipping and unclipping pins from horizontal dowel using forearm pron/sup with a 3 point pinch.      Neuro re-ed: Facilitated L GMC/FMC skills working to pick up small glass stones from table top flat side down, and place on top of vertical jumbo pegs.  Pt had to stabilize upright pegs with R hand as she was unable to place stones without knocking down pegs.  Pt practiced removing stones from pegs without stabilizing peg with R hand, then practiced reaching over and around pegs to slide stones from behind without knocking down pegs.  Pt practiced picking up 1 stone at a time, storing up to 6 in palm, and discarding stones 1 at a time from fingertips.    PATIENT EDUCATION:  Education details: Continue to limit repetitive flexion of L 3rd digit and wear splint with activity to prevent triggering.  Person educated: Patient  Education method: Explanation and Verbal  cues Education comprehension: verbalized understanding, returned demonstration, and needs further education   HOME EXERCISE PROGRAM FMC/GMC activities for LUE    OT Short Term Goals -        OT SHORT TERM GOAL #1   Title Pt will be indep to perform LUE HEP for coordination and strengthening.    Baseline Eval: not yet initiated; 06/30/22: pt remains consistent with HEP and avoiding repetitive gripping that causes pain in L LF; 08/18/22: pt remains consistent to engage LUE; HEP ongoing as function improves; 08/27/22: same as 08/18/22; 11/12/22: indep; pt regularly engages L hand into daily tasks and knows to maintain low reps with putty to prevent triggering of L LF PIP joint.    Time 6    Period Weeks    Status achieved   Target Date 09/29/22     OT SHORT TERM GOAL #2   Title Pt to be indep with joint protection and pain management strategies to limit triggering of L hand 3rd digit PIP.    Baseline Eval: Trigger finger splint recommended, initiated joint protection educ; 06/30/22: pt is consistent to wear her trigger finger splint and avoids repetitive gripping which causes pain to L LF PIP joint, though pt is still experiencing moderate pain in this joint with occasional triggering.  Pt plans to follow up with MD next week; 08/18/22: pt is indep with joint protection, though she states triggering has worsened since her cortisone injection, yet her pain is better.  Possibility that pt is using her hand more d/t less pain and OT reminded pt to limit repetition with gripping; 08/27/22: same as 08/18/22; 11/12/22: pt is indep with joint protection and states L LF has not triggered lately   Time 6    Period Weeks    Status achieved   Target Date 09/29/22              OT Long Term Goals -  02/04/23      OT LONG TERM GOAL #1   Title Pt will increase FOTO by 1-2 points to indicate improvement in perceived functional indep with daily tasks.    Baseline Eval: FOTO 60; 06/30/22: 61; 08/18/22: 59; 08/27/22:  NT this date; 11/12/22: 54   Time 12    Period Weeks    Status ongoing   Target Date 02/04/23     OT LONG TERM GOAL #2   Title Pt will demonstrate increased L FMC/GMC to enable indep with putting hair in ponytail.    Baseline Eval: unable; 06/30/22: pt can hold her hair in a ponytail with BUEs but can not yet manage the rubber band; 08/18/22: pt can tie hair in ponytail   Time 12    Period Weeks    Status Achieved    Target Date 08/10/22      OT LONG TERM GOAL #3   Title Pt will increase L FMC to enable indep with tying shoe laces.    Baseline Eval: unable to tie laces; wears slip on shoes; 06/30/22: pt still wearing slip on shoes; 08/18/22: pt stated she tied her laces the other day, but it took her 4 trials; 08/27/22: same as 08/18/22; 11/12/22: requires multiple trials    Time 12    Period Weeks    Status ongoing   Target Date 02/04/23     OT LONG TERM GOAL #4   Title Pt will increase L hand dexterity skills to enable pt to pick up coins from table  with L hand.    Baseline Eval: unable; 06/30/22: pt can pick up coins from table with extra time.  Dexterity skills improving as noted by improved 9 hole peg test from 5 min 38 sec to 3 min 44 sec on the L; 08/18/22: Pt picks up coins from table with mild-moderate difficulty, L 9 hole 2 min 50 sec; 08/27/22: L 9 hole 2 min 9 secs; 11/12/22: 2 min 16 sec (mild-mod difficulty with picking up coins from a table)   Time 12    Period Weeks    Status ongoing   Target Date 02/04/23     OT LONG TERM GOAL #5   Title Pt will increase L grip strength by 5 or more lbs to improve ability to hold and carry ADL supplies in L hand.    Baseline Eval: L grip 17# (R 35#); 06/30/22: L grip 10# (pain in L LF significantly limiting grip as finger continues to trigger; 08/18/22: L grip 23#; 08/27/22: L grip 25#; 02/04/23: L grip 29#   Time 12    Period Weeks    Status ongoing   Target Date 02/04/23            Plan - 06/02/22 1036     Clinical Impression Statement Pt  continues to progress with [place] [facility] skills.  Pt had to stabilize upright pegs with R hand as she was unable to place stones without knocking down pegs.  Pt practiced removing stones from pegs without stabilizing peg with R hand with fairly good accuracy, then practiced reaching over and around pegs to slide stones from behind without knocking down pegs, though several pegs were knocked down.  Noted improvement with picking up stones flat side down on table top.  Pt stored up to 6 in hand and had some dropped stones when working to move them from palm to fingertips without dropping the others.  Pt will continue to benefit from skilled OT for increasing LUE strength and maximizing coordination skills in order to increase efficiency when engaging LUE into daily tasks.      OT Occupational Profile and History Problem Focused Assessment - Including review of records relating to presenting problem    Occupational performance deficits (Please refer to evaluation for details): ADL's;IADL's;Work    Body Structure / Function / Physical Skills ADL;GMC;UE functional use;FMC;Decreased knowledge of use of DME;Pain;Strength;Coordination;Dexterity;IADL;Sensation;Balance    Rehab Potential Good    Clinical Decision Making Limited treatment options, no task modification necessary    Comorbidities Affecting Occupational Performance: May have comorbidities impacting occupational performance    Modification or Assistance to Complete Evaluation  No modification of tasks or assist necessary to complete eval    OT Frequency 1-2x / week    OT Duration 12 weeks    OT Treatment/Interventions Self-care/ADL training;Therapeutic exercise;Therapeutic activities;Cryotherapy;Moist Heat;Neuromuscular education;DME and/or AE instruction;Manual Therapy;Splinting;Patient/family education    Plan OT focus on LUE coordination/strength training and education/splinting for L hand 3rd digit PIP triggering    OT Home Exercise Plan L GMC/FMC  tasks, strengthening as tolerated   Consulted and Agree with Plan of Care Patient            Leta Speller, MS, OTR/L   Darleene Cleaver, OT 12/17/2022, 8:39 AM

## 2022-12-22 ENCOUNTER — Ambulatory Visit: Payer: PPO

## 2022-12-22 ENCOUNTER — Ambulatory Visit: Payer: Self-pay

## 2022-12-23 ENCOUNTER — Ambulatory Visit: Payer: PPO | Admitting: Urology

## 2022-12-23 ENCOUNTER — Other Ambulatory Visit (HOSPITAL_COMMUNITY): Payer: Self-pay

## 2022-12-23 ENCOUNTER — Other Ambulatory Visit: Payer: Self-pay

## 2022-12-23 VITALS — BP 176/93 | HR 96 | Ht 67.0 in

## 2022-12-23 DIAGNOSIS — N39498 Other specified urinary incontinence: Secondary | ICD-10-CM

## 2022-12-23 DIAGNOSIS — N3000 Acute cystitis without hematuria: Secondary | ICD-10-CM

## 2022-12-23 DIAGNOSIS — N3941 Urge incontinence: Secondary | ICD-10-CM | POA: Diagnosis not present

## 2022-12-23 LAB — URINALYSIS, COMPLETE
Bilirubin, UA: NEGATIVE
Glucose, UA: NEGATIVE
Ketones, UA: NEGATIVE
Nitrite, UA: POSITIVE — AB
Specific Gravity, UA: 1.02 (ref 1.005–1.030)
Urobilinogen, Ur: 0.2 mg/dL (ref 0.2–1.0)
pH, UA: 5 (ref 5.0–7.5)

## 2022-12-23 LAB — MICROSCOPIC EXAMINATION: WBC, UA: >30 /hpf — AB (ref 0–5)

## 2022-12-23 LAB — BLADDER SCAN AMB NON-IMAGING: Scan Result: 52

## 2022-12-23 MED ORDER — SULFAMETHOXAZOLE-TRIMETHOPRIM 800-160 MG PO TABS
1.0000 | ORAL_TABLET | Freq: Two times a day (BID) | ORAL | 0 refills | Status: DC
  Filled 2022-12-23: qty 10, 5d supply, fill #0

## 2022-12-23 MED ORDER — OXYBUTYNIN CHLORIDE ER 10 MG PO TB24
10.0000 mg | ORAL_TABLET | Freq: Every day | ORAL | 11 refills | Status: DC
  Filled 2022-12-23: qty 30, 30d supply, fill #0

## 2022-12-23 MED ORDER — OXYBUTYNIN CHLORIDE ER 10 MG PO TB24
10.0000 mg | ORAL_TABLET | Freq: Every day | ORAL | 11 refills | Status: DC
  Filled 2022-12-23 – 2022-12-29 (×2): qty 30, 30d supply, fill #0
  Filled 2023-02-01: qty 30, 30d supply, fill #1

## 2022-12-23 NOTE — Progress Notes (Signed)
I, Jeanmarie Hubert Maxie,acting as a scribe for Hollice Espy, MD.,have documented all relevant documentation on the behalf of Hollice Espy, MD,as directed by  Hollice Espy, MD while in the presence of Hollice Espy, MD.   12/23/22 11:14 AM   [name] [birth year] [id number]  Referring provider: Izora Ribas, MD [address] [place],  [place] [address]  Chief Complaint  Patient presents with   Establish Care   Urinary Incontinence    HPI: 67 year-old female who presents today for further evaluation of urinary incontinence.   She has a history of diabetes which is managed by Jardiance, Glyburide, and Metformin. Most recent hemoglobin A1c was 10.4.   Today's urinalysis shows greater than 30 WBC's, 3-10 RBC's, many bacteria, nitrate positive.   Today she mainly complains of urge incontinence. She notes she had stress incontinence with cough, laughing, sneezing when she was younger but she feels this has improved. She notes leakage when she stands "gushes out" which she is having to wear diapers. She denies vaginal bulging. She notes occasional constipation but not normal for her. She reports she had gross hematuria in the past but workup was unremarkable. She denies UTI symptoms currently. She drinks a lot of peach tea. She never started Myrbetriq due to cost.   She has a history of stroke 04/19/2021.   Results for orders placed or performed in visit on 12/23/22  Bladder Scan (Post Void Residual) in office  Result Value Ref Range   Scan Result 52 ml     PMH: Past Medical History:  Diagnosis Date   Anxiety    Arthritis    CAD (coronary artery disease)    a. 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 s/p CABG x4 LIMA to LAD, SVG to PDA, and Sequential SVG to OM and Diagonal on 8/   Stroke ([place]) 04/2021   admitted at [facility] - left sided weakness   Urinary incontinence    as stated by patient    Surgical History: Past Surgical History:  Procedure Laterality Date   AORTIC VALVE REPLACEMENT N/A 08/04/2021   Procedure: RESECTION AORTIC VALVE TUMOR;  Surgeon: [name], MD;  Location: [place];  Service: Open Heart Surgery;  Laterality: N/A;   APPLICATION OF WOUND VAC N/A 08/24/2021   Procedure: APPLICATION OF WOUND VAC;  Surgeon: Gaye Pollack, MD;  Location: [place] [facility];  Service: Thoracic;  Laterality: N/A;   APPLICATION OF WOUND VAC N/A 08/28/2021   Procedure: WOUND VAC CHANGE;  Surgeon: [name], MD;  Location: [place];  Service: Thoracic;  Laterality: N/A;   BREAST EXCISIONAL BIOPSY Right 1999   neg   BUBBLE STUDY  04/24/2021   Procedure: BUBBLE STUDY;  Surgeon: Sueanne Margarita, MD;  Location: [place];  Service: Cardiovascular;;  CATARACT EXTRACTION  12/2011   CATARACT EXTRACTION W/PHACO Left 12/02/2016   Procedure: CATARACT EXTRACTION PHACO AND INTRAOCULAR LENS PLACEMENT (IOC);  Surgeon: Estill Cotta, MD;  Location: [facility];  Service: Ophthalmology;  Laterality: Left;  [place] 2:14 AP% 28.4 CDE 59.73 Fluid pack lot # [id number]   COLONOSCOPY WITH PROPOFOL N/A 04/13/2022   Procedure: COLONOSCOPY WITH PROPOFOL;  Surgeon: Lin Landsman, MD;  Location: [facility];  Service: Gastroenterology;  Laterality: N/A;   CORONARY ARTERY BYPASS GRAFT N/A 08/04/2021   Procedure: CORONARY ARTERY BYPASS GRAFTING (CABG) X  4  USING LEFT INTERNAL MAMMARY ARTERY AND RIGHT GREATER SAPHENOUS VEIN  ENDOSCOPIC CONDUITS;  Surgeon: [name], MD;  Location: [place];  Service: Open Heart Surgery;  Laterality: N/A;   DIAGNOSTIC LAPAROSCOPY     EYE SURGERY     IR CT HEAD LTD  04/19/2021   IR PERCUTANEOUS ART THROMBECTOMY/INFUSION INTRACRANIAL INC DIAG ANGIO  04/19/2021       IR PERCUTANEOUS ART THROMBECTOMY/INFUSION INTRACRANIAL INC DIAG ANGIO  04/19/2021   IR US GUIDE VASC ACCESS LEFT  04/19/2021   RADIOLOGY WITH ANESTHESIA N/A 04/19/2021   Procedure: IR WITH ANESTHESIA;  Surgeon: Luanne Bras, MD;  Location: [place];  Service: Radiology;  Laterality: N/A;   STERNAL WOUND DEBRIDEMENT N/A 08/24/2021   Procedure: STERNAL WOUND DRAINAGE AND DEBRIDEMENT;  Surgeon: Gaye Pollack, MD;  Location: [place] [facility];  Service: Thoracic;  Laterality: N/A;   STERNAL WOUND DEBRIDEMENT N/A 08/28/2021   Procedure: STERNAL WOUND DEBRIDEMENT;  Surgeon: [name], MD;  Location: [place];  Service: Thoracic;  Laterality: N/A;   TEE WITHOUT CARDIOVERSION N/A 04/24/2021   Procedure: TRANSESOPHAGEAL ECHOCARDIOGRAM (TEE);  Surgeon: Sueanne Margarita, MD;  [facility];  Service: Cardiovascular;  Laterality: N/A;   TEE WITHOUT CARDIOVERSION N/A 08/04/2021   Procedure: TRANSESOPHAGEAL ECHOCARDIOGRAM (TEE);  Surgeon: [name], MD;  Location: [place];  Service: Open Heart Surgery;  Laterality: N/A;   TOTAL HIP ARTHROPLASTY Right 04/04/2018   Procedure: TOTAL HIP ARTHROPLASTY;  Surgeon: Dereck Leep, MD;  Location: [facility];  Service: Orthopedics;  Laterality: Right;    Home Medications:  Allergies as of 12/23/2022       Reactions   Jardiance [empagliflozin] Itching   Recurrent mycotic infections   Penicillins Rash, Other (See Comments)   Has patient had a PCN reaction causing immediate rash, facial/tongue/throat swelling, SOB or lightheadedness with hypotension: No Has patient had a PCN reaction causing severe rash involving mucus membranes or skin necrosis: No Has patient  had a PCN reaction that required hospitalization No Has patient had a PCN reaction occurring within the last 10 years: No If all of the above answers are "NO", then may proceed with Cephalosporin use.        Medication List        Accurate as of December 23, 2022 11:14 AM. If you have any questions, ask your nurse or doctor.          acetaminophen 325 MG tablet Commonly known as: TYLENOL Take 2 tablets (650 mg total) by mouth every 4 (four) hours as needed for mild pain (or temp > 37.5 C (99.5 F)).   albuterol 108 (90 Base) MCG/ACT inhaler Commonly known as: VENTOLIN HFA Inhale 2 puffs into the lungs every 6 (six) hours as needed for wheezing or shortness of breath.   ALPRAZolam 0.5 MG tablet Commonly known as: XANAX Take 1 tablet (0.5 mg total) by mouth 2 (two) times daily as  needed for anxiety   amLODipine 10 MG tablet Commonly known as: NORVASC Take 1 tablet (10 mg total) by mouth daily.   aspirin EC 81 MG tablet Take 1 tablet (81 mg total) by mouth daily. Swallow whole.   cetirizine 10 MG tablet Commonly known as: ZYRTEC Take 1 tablet (10 mg total) by mouth daily.   empagliflozin 10 MG Tabs tablet Commonly known as: Jardiance Take 1 tablet (10 mg total) by mouth daily before breakfast.   FreeStyle Freedom Lite w/Device Kit USE AS DIRECTED   FREESTYLE LITE test strip Generic drug: glucose blood Also needs Lancets. Use to check blood sugar up to four times a day for insulin dependant diabetes   gabapentin 300 MG capsule Commonly known as: NEURONTIN Take 1-2 capsules (300-600 mg total) by mouth at bedtime.   glyBURIDE 5 MG tablet Commonly known as: DIABETA Take 2 tablets (10 mg total) by mouth 2 (two) times daily with a meal.   guaiFENesin-dextromethorphan 100-10 MG/5ML syrup Commonly known as: ROBITUSSIN DM Take 5 mLs by mouth every 4 (four) hours as needed for cough.   latanoprost 0.005 % ophthalmic solution Commonly known as: XALATAN Place 1 drop  into the left eye at bedtime.   losartan 100 MG tablet Commonly known as: COZAAR Take 1 tablet (100 mg total) by mouth daily.   metFORMIN 500 MG 24 hr tablet Commonly known as: GLUCOPHAGE-XR Take 2 tablets (1,000 mg total) by mouth 2 (two) times daily with a meal.   mirabegron ER 25 MG Tb24 tablet Commonly known as: Myrbetriq Take 1 tablet (25 mg total) by mouth daily.   montelukast 10 MG tablet Commonly known as: SINGULAIR Take 1 tablet (10 mg total) by mouth at bedtime.   multivitamin tablet Take 1 tablet by mouth daily.   oxybutynin 10 MG 24 hr tablet Commonly known as: DITROPAN-XL Take 1 tablet (10 mg total) by mouth daily. Started by: Hollice Espy, MD   rosuvastatin 40 MG tablet Commonly known as: CRESTOR Take 1 tablet (40 mg total) by mouth daily.   sertraline 25 MG tablet Commonly known as: Zoloft Take 1 tablet by mouth at bedtime.   sulfamethoxazole-trimethoprim 800-160 MG tablet Commonly known as: BACTRIM DS Take 1 tablet by mouth every 12 (twelve) hours. Started by: Hollice Espy, MD   Vitamin D (Ergocalciferol) 1.25 MG (50000 UNIT) Caps capsule Commonly known as: DRISDOL Take 1 capsule (50,000 Units total) by mouth every 7 (seven) days.        Allergies:  Allergies  Allergen Reactions   Jardiance [Empagliflozin] Itching    Recurrent mycotic infections   Penicillins Rash and Other (See Comments)    Has patient had a PCN reaction causing immediate rash, facial/tongue/throat swelling, SOB or lightheadedness with hypotension: No Has patient had a PCN reaction causing severe rash involving mucus membranes or skin necrosis: No Has patient had a PCN reaction that required hospitalization No Has patient had a PCN reaction occurring within the last 10 years: No If all of the above answers are "NO", then may proceed with Cephalosporin use.     Family History: Family History  Problem Relation Age of Onset   Bone cancer Brother    Aneurysm Mother         brain   Diabetes Father    CAD Father    Heart failure Father    Colon cancer Father    Cancer Father    Alzheimer's disease Paternal Grandmother    Dementia Paternal Grandmother  Mental illness Paternal Grandfather    Healthy Daughter    Healthy Son    Breast cancer Neg Hx     Social History:  reports that she quit smoking about 15 years ago. Her smoking use included cigarettes. She has a 37.50 pack-year smoking history. She has never used smokeless tobacco. She reports that she does not currently use alcohol. She reports that she does not use drugs.   Physical Exam: BP (!) 176/93   Pulse 96   Ht '5\' 7"'$  (1.702 m)   BMI 27.69 kg/m   Constitutional:  Alert and oriented, No acute distress. HEENT: [place] AT, moist mucus membranes.  Trachea midline, no masses. Neurologic: Grossly intact, no focal deficits, moving all 4 extremities. Psychiatric: Normal mood and affect.  Assessment & Plan:    Acute cystitis - Her urine today is frankly positive this may be a contributing factor. We'll go ahead and treat it and see if her urinary symptoms improve.  Urge incontinence - Multifactorial, including history of stroke, poorly controlled diabetes and Behavior. Discussed behavioral modification today. She's emptying reasonably well. She never started Mybetriq due to cost. Although this could be an option if it's effective.  - We'll start her with Oxybutynin 10 mg XL daily. But don't start it for a few weeks, depending on how she does with the antibiotics.  Return in about 3 months (around 03/24/2023) for [place] or [place] for UA/ PVR/ symptom recheck.   [place] [address], [place], [place] [address] [phone]

## 2022-12-24 ENCOUNTER — Ambulatory Visit: Payer: PPO

## 2022-12-24 ENCOUNTER — Ambulatory Visit: Payer: Self-pay

## 2022-12-25 ENCOUNTER — Ambulatory Visit: Payer: Self-pay | Admitting: Family Medicine

## 2022-12-27 LAB — CULTURE, URINE COMPREHENSIVE

## 2022-12-29 ENCOUNTER — Ambulatory Visit: Payer: Self-pay

## 2022-12-29 ENCOUNTER — Other Ambulatory Visit: Payer: Self-pay

## 2022-12-29 ENCOUNTER — Ambulatory Visit: Payer: PPO

## 2022-12-29 DIAGNOSIS — I6381 Other cerebral infarction due to occlusion or stenosis of small artery: Secondary | ICD-10-CM

## 2022-12-29 DIAGNOSIS — R278 Other lack of coordination: Secondary | ICD-10-CM

## 2022-12-29 DIAGNOSIS — M6281 Muscle weakness (generalized): Secondary | ICD-10-CM | POA: Diagnosis not present

## 2022-12-30 NOTE — Therapy (Signed)
OUTPATIENT OCCUPATIONAL THERAPY NEURO TREATMENT NOTE    Patient Name: [name] MRN: [id number] DOB:[birth year], 67 y.o., female Today's Date: 12/30/2022  PCP: Dr. Birdie Sons REFERRING PROVIDER: Frann Rider, NP   OT End of Session - 12/29/22 1607     Visit Number 37    Number of Visits 68    Date for OT Re-Evaluation 02/04/23    Authorization Time Period Reporting period beginning 11/12/22    OT Start Time 1607               Past Medical History:  Diagnosis Date   Anxiety    Arthritis    CAD (coronary artery disease)    a. 3 vessel CAD noted on coronary CTA 04/2021, s/p CABG x4   Cataract    left   Depression    Diabetes mellitus without complication ([place])    type 2   Environmental and seasonal allergies    GERD (gastroesophageal reflux disease)    Hyperlipidemia    Hypertension    Joint pain    as reported by patient   Papillary fibroelastoma of heart    of the aortic valve,  s/p AVR and tumor resection on 08/04/2021   Right thalamic infarction ([place]) 04/29/2021   S/P AVR (aortic valve replacement)    s/p AVR with resection of AV fibroelastoma on 08/04/2021   S/P CABG x 4    a. s/p CABG x4 LIMA to LAD, SVG to PDA, and Sequential SVG to OM and Diagonal on 8/   Stroke ([place]) 04/2021   ad[facility] at [facility] - left sided weakness   Urinary incontinence    as stated by patient   Past Surgical History:  Procedure Laterality Date   AORTIC VALVE REPLACEMENT N/A 08/04/2021   Procedure: RESECTION AORTIC VALVE TUMOR;  Surgeon: [name], MD;  Location: [place];  Service: Open Heart Surgery;  Laterality: N/A;   APPLICATION OF WOUND VAC N/A 08/24/2021   Procedure: APPLICATION OF WOUND VAC;  Surgeon: Gaye Pollack, MD;  Location: [place] [facility];  Service: Thoracic;  Laterality: N/A;   APPLICATION OF WOUND VAC N/A 08/28/2021   Procedure: WOUND VAC CHANGE;  Surgeon: [name], MD;  Location: [place];  Service: Thoracic;  Laterality: N/A;   BREAST  EXCISIONAL BIOPSY Right 1999   neg   BUBBLE STUDY  04/24/2021   Procedure: BUBBLE STUDY;  Surgeon: Sueanne Margarita, MD;  Location: [place];  Service: Cardiovascular;;   CATARACT EXTRACTION  12/2011   CATARACT EXTRACTION W/PHACO Left 12/02/2016   Procedure: CATARACT EXTRACTION PHACO AND INTRAOCULAR LENS PLACEMENT ([place]);  Surgeon: Estill Cotta, MD;  Location: [facility];  Service: Ophthalmology;  Laterality: Left;  [place] 2:14 AP% 28.4 CDE 59.73 Fluid pack lot # [id number]   COLONOSCOPY WITH PROPOFOL N/A 04/13/2022   Procedure: COLONOSCOPY WITH PROPOFOL;  Surgeon: Lin Landsman, MD;  Location: [facility] ENDOSCOPY;  Service: Gastroenterology;  Laterality: N/A;   CORONARY ARTERY BYPASS GRAFT N/A 08/04/2021   Procedure: CORONARY ARTERY BYPASS GRAFTING (CABG) X  4  USING LEFT INTERNAL MAMMARY ARTERY AND RIGHT GREATER SAPHENOUS VEIN ENDOSCOPIC CONDUITS;  Surgeon: [name], MD;  Location: [place];  Service: Open Heart Surgery;  Laterality: N/A;   DIAGNOSTIC LAPAROSCOPY     EYE SURGERY     IR CT HEAD LTD  04/19/2021   IR PERCUTANEOUS ART THROMBECTOMY/INFUSION INTRACRANIAL INC DIAG ANGIO  04/19/2021       IR PERCUTANEOUS ART THROMBECTOMY/INFUSION INTRACRANIAL INC DIAG  ANGIO  04/19/2021   IR US GUIDE VASC ACCESS LEFT  04/19/2021   RADIOLOGY WITH ANESTHESIA N/A 04/19/2021   Procedure: IR WITH ANESTHESIA;  Surgeon: Luanne Bras, MD;  Location: [place];  Service: Radiology;  Laterality: N/A;   STERNAL WOUND DEBRIDEMENT N/A 08/24/2021   Procedure: STERNAL WOUND DRAINAGE AND DEBRIDEMENT;  Surgeon: Gaye Pollack, MD;  Location: [place] [facility];  Service: Thoracic;  Laterality: N/A;   STERNAL WOUND DEBRIDEMENT N/A 08/28/2021   Procedure: STERNAL WOUND DEBRIDEMENT;  Surgeon: [name], MD;  Location: [place];  Service: Thoracic;  Laterality: N/A;   TEE WITHOUT CARDIOVERSION N/A 04/24/2021   Procedure: TRANSESOPHAGEAL ECHOCARDIOGRAM (TEE);  Surgeon: Sueanne Margarita, MD;  Location: [facility]  ENDOSCOPY;  Service: Cardiovascular;  Laterality: N/A;   TEE WITHOUT CARDIOVERSION N/A 08/04/2021   Procedure: TRANSESOPHAGEAL ECHOCARDIOGRAM (TEE);  Surgeon: [name], MD;  Location: [place];  Service: Open Heart Surgery;  Laterality: N/A;   TOTAL HIP ARTHROPLASTY Right 04/04/2018   Procedure: TOTAL HIP ARTHROPLASTY;  Surgeon: Dereck Leep, MD;  Location: [facility];  Service: Orthopedics;  Laterality: Right;   Patient Active Problem List   Diagnosis Date Noted   Congestion of nasal sinus 12/04/2022   COVID 12/04/2022   Excessive cerumen in both ear canals 10/16/2022   Hyperlipidemia associated with type 2 diabetes mellitus ([place]) 10/16/2022   Hypertension associated with diabetes ([place]) 10/16/2022   Type 2 diabetes mellitus with stage 2 chronic kidney disease, without long-term current use of insulin ([place]) 10/16/2022   Colon cancer screening    Subcutaneous nodule    Diabetic peripheral neuropathy ([place])    Debility 08/11/2021   S/P CABG x 4 08/04/2021   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   Hyperkalemia    Chronic bilateral low back pain without sciatica    Leukocytosis    Hyponatremia    Slow transit constipation    Hemiparesis affecting left side as late effect of stroke ([place])    History of CVA with residual deficit 04/19/2021   Dyslipidemia, goal LDL below 70 04/18/2021   Recurrent major depressive disorder, in partial remission ([place]) 09/20/2020   Status post total replacement of hip 04/04/2018   Primary osteoarthritis of right hip 02/20/2018   Allergic rhinitis 10/28/2015   Acute stress disorder 08/01/2015   Anxiety 08/01/2015   Clinical depression 08/01/2015   Diabetes mellitus with coincident hypertension ([place]) 08/01/2015   Diverticulosis of colon 08/01/2015   Generalized pruritus 08/01/2015   Cannot sleep 08/01/2015   Adiposity 08/01/2015   Detrusor muscle hypertonia 08/01/2015   Hypercholesterolemia without hypertriglyceridemia 08/01/2015   Female  stress incontinence 08/01/2015   Avitaminosis D 08/01/2015    ONSET DATE: 04/18/2021  REFERRING DIAG: hemiparesis affecting L side as late effect of CVA; dsymetria  THERAPY DIAG:  Muscle weakness (generalized)  Other lack of coordination  Right thalamic infarction [facility])  Rationale for Evaluation and Treatment Rehabilitation  PERTINENT HISTORY: Pt s/p R CVA on 04/18/21. Pt was initially recovering very well from a stroke standpoint with mild LUE >LLE numbness and ambulating with a cane. 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 Therapy eval's recommended CIR for decreased functional mobility - d/c to CIR on 8/29. She required incision debridement of sternal wound and application of VAC eventually discontinued on 9/29. She was discharged home on 9/30 (after a 32 day stay).  PRECAUTIONS: Fall  SUBJECTIVE: Pt reports her L arm is feeling fine after several days of pain last week when she slipped on a toy and fell at home.  PAIN:  Are you having pain? 0 pain   OBJECTIVE:    [facility] OT Assessment - 07/01/22 0001       07/01/22 08/18/22 08/27/22 11/12/22            Observation/Other Assessments       Focus on Therapeutic Outcomes (FOTO)  61  59 NT 54 (score did not convey improvement, but pt acknowledges using LUE better and more consistently with ADLs)            Coordination       Gross Motor Movements are Fluid and Coordinated No  No (improving) No (improving) No (improving)    Fine Motor Movements are Fluid and Coordinated No  No (improving) No (improving) No (not improved from last assessment); pt has had several missed appointments since 08/27/22 d/t grandson being in the hospital.    Right 9 Hole Peg Test 31 sec  NT NT NT    Left 9 Hole Peg Test 3 min 44 sec   improved from 5 min 38 sec 2 min 50 sec (improved from 3 min 44 sec) 2  min 9 sec  2 min 16 sec            Hand Function       Right Hand Grip (lbs) 35  NT NT     Right Hand Lateral Pinch 10 lbs  NT NT     Right Hand 3 Point Pinch 7 lbs  NT NT     Left Hand Grip (lbs) 10   decline (L LF pain from triggering limiting grip strength) 23# 25# 29#    Left Hand Lateral Pinch 6 lbs  8# 9# 10#    Left 3 point pinch 5 lbs   finger slipping 8# 6# (finger slipping) 8# (finger slipping)        TODAY'S TREATMENT:    Neuro re-ed: Facilitated L GMC/FMC skills working with Bethena Roys board pegs and pegboard.  Pegboard placed on an incline and pegs were placed on non-skid surface on table top, then pt worked to rotate pegs within fingertips to place pegs in board dot side up.  Pt then practiced removing pegs and rotating them dot side down back into pegboard, requiring demo and vc for techniques to reduce dropped pegs.  Practiced tracing, marking a target, and connecting lines using L hand with thick dry erase marker to mark on table top whiteboard (vertically placed).  Pt required mod vc and demo to perform without stabilizing L arm on table top to further challenge [place].   PATIENT EDUCATION:  Education details: L FMC/GMC activities  Person educated: Patient Education method: Merchandiser, retail cues Education comprehension: verbalized understanding, returned demonstration, and needs further education   HOME EXERCISE PROGRAM FMC/GMC activities for LUE    OT Short Term Goals -        OT SHORT TERM GOAL #1   Title Pt will be indep to perform LUE HEP for coordination and strengthening.    Baseline Eval: not yet initiated; 06/30/22: pt remains consistent with HEP and avoiding repetitive gripping that causes pain in L LF; 08/18/22: pt remains consistent to engage LUE; HEP ongoing as function improves; 08/27/22: same as 08/18/22; 11/12/22: indep; pt regularly engages L hand into daily tasks and knows to maintain low reps with putty to prevent  triggering of L LF PIP joint.    Time 6     Period Weeks    Status achieved   Target Date 09/29/22     OT SHORT TERM GOAL #2   Title Pt to be indep with joint protection and pain management strategies to limit triggering of L hand 3rd digit PIP.    Baseline Eval: Trigger finger splint recommended, initiated joint protection educ; 06/30/22: pt is consistent to wear her trigger finger splint and avoids repetitive gripping which causes pain to L LF PIP joint, though pt is still experiencing moderate pain in this joint with occasional triggering.  Pt plans to follow up with MD next week; 08/18/22: pt is indep with joint protection, though she states triggering has worsened since her cortisone injection, yet her pain is better.  Possibility that pt is using her hand more d/t less pain and OT reminded pt to limit repetition with gripping; 08/27/22: same as 08/18/22; 11/12/22: pt is indep with joint protection and states L LF has not triggered lately   Time 6    Period Weeks    Status achieved   Target Date 09/29/22              OT Long Term Goals -  02/04/23      OT LONG TERM GOAL #1   Title Pt will increase FOTO by 1-2 points to indicate improvement in perceived functional indep with daily tasks.    Baseline Eval: FOTO 60; 06/30/22: 61; 08/18/22: 59; 08/27/22: NT this date; 11/12/22: 54   Time 12    Period Weeks    Status ongoing   Target Date 02/04/23     OT LONG TERM GOAL #2   Title Pt will demonstrate increased L FMC/GMC to enable indep with putting hair in ponytail.    Baseline Eval: unable; 06/30/22: pt can hold her hair in a ponytail with BUEs but can not yet manage the rubber band; 08/18/22: pt can tie hair in ponytail   Time 12    Period Weeks    Status Achieved    Target Date 08/10/22      OT LONG TERM GOAL #3   Title Pt will increase L FMC to enable indep with tying shoe laces.    Baseline Eval: unable to tie laces; wears slip on shoes; 06/30/22: pt still wearing slip on shoes; 08/18/22: pt stated she tied her laces the other day,  but it took her 4 trials; 08/27/22: same as 08/18/22; 11/12/22: requires multiple trials    Time 12    Period Weeks    Status ongoing   Target Date 02/04/23     OT LONG TERM GOAL #4   Title Pt will increase L hand dexterity skills to enable pt to pick up coins from table with L hand.    Baseline Eval: unable; 06/30/22: pt can pick up coins from table with extra time.  Dexterity skills improving as noted by improved 9 hole peg test from 5 min 38 sec to 3 min 44 sec on the L; 08/18/22: Pt picks up coins from table with mild-moderate difficulty, L 9 hole 2 min 50 sec; 08/27/22: L 9 hole 2 min 9 secs; 11/12/22: 2 min 16 sec (mild-mod difficulty with picking up coins from a table)   Time 12    Period Weeks    Status ongoing   Target Date 02/04/23     OT LONG TERM GOAL #5   Title Pt will increase L grip  strength by 5 or more lbs to improve ability to hold and carry ADL supplies in L hand.    Baseline Eval: L grip 17# (R 35#); 06/30/22: L grip 10# (pain in L LF significantly limiting grip as finger continues to trigger; 08/18/22: L grip 23#; 08/27/22: L grip 25#; 02/04/23: L grip 29#   Time 12    Period Weeks    Status ongoing   Target Date 02/04/23            Plan - 06/02/22 1036     Clinical Impression Statement Pt continues to progress with [place] and [facility] skills.  Pt is improving with accuracy when reaching toward a target, but continues to drop items frequently when working to move small items from palm to fingertips or when repositioning an item within fingertips.  Pt will continue to benefit from skilled OT for increasing LUE strength and maximizing coordination skills in order to increase efficiency when engaging LUE into daily tasks.      OT Occupational Profile and History Problem Focused Assessment - Including review of records relating to presenting problem    Occupational performance deficits (Please refer to evaluation for details): ADL's;IADL's;Work    Body Structure / Function / Physical  Skills ADL;GMC;UE functional use;FMC;Decreased knowledge of use of DME;Pain;Strength;Coordination;Dexterity;IADL;Sensation;Balance    Rehab Potential Good    Clinical Decision Making Limited treatment options, no task modification necessary    Comorbidities Affecting Occupational Performance: May have comorbidities impacting occupational performance    Modification or Assistance to Complete Evaluation  No modification of tasks or assist necessary to complete eval    OT Frequency 1-2x / week    OT Duration 12 weeks    OT Treatment/Interventions Self-care/ADL training;Therapeutic exercise;Therapeutic activities;Cryotherapy;Moist Heat;Neuromuscular education;DME and/or AE instruction;Manual Therapy;Splinting;Patient/family education    Plan OT focus on LUE coordination/strength training and education/splinting for L hand 3rd digit PIP triggering    OT Home Exercise Plan L GMC/FMC tasks, strengthening as tolerated   Consulted and Agree with Plan of Care Patient            Leta Speller, MS, OTR/L   Darleene Cleaver, OT 12/30/2022, 2:39 PM

## 2022-12-31 ENCOUNTER — Ambulatory Visit: Payer: PPO

## 2022-12-31 ENCOUNTER — Ambulatory Visit: Payer: Self-pay

## 2023-01-05 ENCOUNTER — Ambulatory Visit: Payer: PPO

## 2023-01-05 ENCOUNTER — Ambulatory Visit: Payer: Self-pay

## 2023-01-06 ENCOUNTER — Ambulatory Visit: Payer: PPO

## 2023-01-06 ENCOUNTER — Other Ambulatory Visit: Payer: Self-pay

## 2023-01-06 DIAGNOSIS — I6381 Other cerebral infarction due to occlusion or stenosis of small artery: Secondary | ICD-10-CM

## 2023-01-06 DIAGNOSIS — M6281 Muscle weakness (generalized): Secondary | ICD-10-CM

## 2023-01-06 DIAGNOSIS — R278 Other lack of coordination: Secondary | ICD-10-CM

## 2023-01-07 ENCOUNTER — Ambulatory Visit: Payer: Self-pay

## 2023-01-07 ENCOUNTER — Ambulatory Visit: Payer: PPO

## 2023-01-07 NOTE — Therapy (Signed)
OUTPATIENT OCCUPATIONAL THERAPY NEURO TREATMENT NOTE    Patient Name: [name] MRN: [id number] DOB:Nov 09, 1956, 67 y.o., female Today's Date: 01/07/2023  PCP: Dr. Birdie Sons REFERRING PROVIDER: Frann Rider, NP   OT End of Session - 01/07/23 1448     Visit Number 38    Number of Visits 54    Date for OT Re-Evaluation 02/04/23    Authorization Time Period Reporting period beginning 11/12/22    OT Start Time 39    OT Stop Time 1645    OT Time Calculation (min) 45 min    Equipment Utilized During Treatment [facility]    Activity Tolerance Patient tolerated treatment well    Behavior During Therapy [facility] for tasks assessed/performed               Past Medical History:  Diagnosis Date   Anxiety    Arthritis    CAD (coronary artery disease)    a. 3 vessel CAD noted on coronary CTA 04/2021, s/p CABG x4   Cataract    left   Depression    Diabetes mellitus without complication ([place])    type 2   Environmental and seasonal allergies    GERD (gastroesophageal reflux disease)    Hyperlipidemia    Hypertension    Joint pain    as reported by patient   Papillary fibroelastoma of heart    of the aortic valve,  s/p AVR and tumor resection on 08/04/2021   Right thalamic infarction ([place]) 04/29/2021   S/P AVR (aortic valve replacement)    s/p AVR with resection of AV fibroelastoma on 08/04/2021   S/P CABG x 4    a. s/p CABG x4 LIMA to LAD, SVG to PDA, and Sequential SVG to OM and Diagonal on 8/   Stroke ([place]) 04/2021   admitted at [facility] - left sided weakness   Urinary incontinence    as stated by patient   Past Surgical History:  Procedure Laterality Date   AORTIC VALVE REPLACEMENT N/A 08/04/2021   Procedure: RESECTION AORTIC VALVE TUMOR;  Surgeon: [name], MD;  Location: [place];  Service: Open Heart Surgery;  Laterality: N/A;   APPLICATION OF WOUND VAC N/A 08/24/2021   Procedure: APPLICATION OF WOUND VAC;  Surgeon: Gaye Pollack, MD;  Location: [place]  OR;  Service: Thoracic;  Laterality: N/A;   APPLICATION OF WOUND VAC N/A 08/28/2021   Procedure: WOUND VAC CHANGE;  Surgeon: [name], MD;  Location: [place];  Service: Thoracic;  Laterality: N/A;   BREAST EXCISIONAL BIOPSY Right 1999   neg   BUBBLE STUDY  04/24/2021   Procedure: BUBBLE STUDY;  Surgeon: Sueanne Margarita, MD;  Location: [place];  Service: Cardiovascular;;   CATARACT EXTRACTION  12/2011   CATARACT EXTRACTION W/PHACO Left 12/02/2016   Procedure: CATARACT EXTRACTION PHACO AND INTRAOCULAR LENS PLACEMENT ([place]);  Surgeon: Estill Cotta, MD;  Location: [facility] ORS;  Service: Ophthalmology;  Laterality: Left;  [place] 2:14 AP% 28.4 CDE 59.73 Fluid pack lot # [id number]   COLONOSCOPY WITH PROPOFOL N/A 04/13/2022   Procedure: COLONOSCOPY WITH PROPOFOL;  Surgeon: Lin Landsman, MD;  Location: [facility] ENDOSCOPY;  Service: Gastroenterology;  Laterality: N/A;   CORONARY ARTERY BYPASS GRAFT N/A 08/04/2021   Procedure: CORONARY ARTERY BYPASS GRAFTING (CABG) X  4  USING LEFT INTERNAL MAMMARY ARTERY AND RIGHT GREATER SAPHENOUS VEIN ENDOSCOPIC CONDUITS;  Surgeon: [name], MD;  Location: [place];  Service: Open Heart Surgery;  Laterality: N/A;  DIAGNOSTIC LAPAROSCOPY     EYE SURGERY     IR CT HEAD LTD  04/19/2021   IR PERCUTANEOUS ART THROMBECTOMY/INFUSION INTRACRANIAL INC DIAG ANGIO  04/19/2021       IR PERCUTANEOUS ART THROMBECTOMY/INFUSION INTRACRANIAL INC DIAG ANGIO  04/19/2021   IR US GUIDE VASC ACCESS LEFT  04/19/2021   RADIOLOGY WITH ANESTHESIA N/A 04/19/2021   Procedure: IR WITH ANESTHESIA;  Surgeon: Luanne Bras, MD;  Location: [place];  Service: Radiology;  Laterality: N/A;   STERNAL WOUND DEBRIDEMENT N/A 08/24/2021   Procedure: STERNAL WOUND DRAINAGE AND DEBRIDEMENT;  Surgeon: Gaye Pollack, MD;  Location: [place] OR;  Service: Thoracic;  Laterality: N/A;   STERNAL WOUND DEBRIDEMENT N/A 08/28/2021   Procedure: STERNAL WOUND DEBRIDEMENT;  Surgeon:  [name], MD;  Location: [place];  Service: Thoracic;  Laterality: N/A;   TEE WITHOUT CARDIOVERSION N/A 04/24/2021   Procedure: TRANSESOPHAGEAL ECHOCARDIOGRAM (TEE);  Surgeon: Sueanne Margarita, MD;  Location: [facility] ENDOSCOPY;  Service: Cardiovascular;  Laterality: N/A;   TEE WITHOUT CARDIOVERSION N/A 08/04/2021   Procedure: TRANSESOPHAGEAL ECHOCARDIOGRAM (TEE);  Surgeon: [name], MD;  Location: [place];  Service: Open Heart Surgery;  Laterality: N/A;   TOTAL HIP ARTHROPLASTY Right 04/04/2018   Procedure: TOTAL HIP ARTHROPLASTY;  Surgeon: Dereck Leep, MD;  Location: [facility] ORS;  Service: Orthopedics;  Laterality: Right;   Patient Active Problem List   Diagnosis Date Noted   Congestion of nasal sinus 12/04/2022   COVID 12/04/2022   Excessive cerumen in both ear canals 10/16/2022   Hyperlipidemia associated with type 2 diabetes mellitus ([place]) 10/16/2022   Hypertension associated with diabetes ([place]) 10/16/2022   Type 2 diabetes mellitus with stage 2 chronic kidney disease, without long-term current use of insulin ([place]) 10/16/2022   Colon cancer screening    Subcutaneous nodule    Diabetic peripheral neuropathy ([place])    Debility 08/11/2021   S/P CABG x 4 08/04/2021   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   Hyperkalemia    Chronic bilateral low back pain without sciatica    Leukocytosis    Hyponatremia    Slow transit constipation    Hemiparesis affecting left side as late effect of stroke ([place])    History of CVA with residual deficit 04/19/2021   Dyslipidemia, goal LDL below 70 04/18/2021   Recurrent major depressive disorder, in partial remission ([place]) 09/20/2020   Status post total replacement of hip 04/04/2018   Primary osteoarthritis of right hip 02/20/2018   Allergic rhinitis 10/28/2015   Acute stress disorder 08/01/2015   Anxiety 08/01/2015   Clinical depression 08/01/2015   Diabetes mellitus with coincident hypertension ([place]) 08/01/2015    Diverticulosis of colon 08/01/2015   Generalized pruritus 08/01/2015   Cannot sleep 08/01/2015   Adiposity 08/01/2015   Detrusor muscle hypertonia 08/01/2015   Hypercholesterolemia without hypertriglyceridemia 08/01/2015   Female stress incontinence 08/01/2015   Avitaminosis D 08/01/2015    ONSET DATE: 04/18/2021  REFERRING DIAG: hemiparesis affecting L side as late effect of CVA; dsymetria  THERAPY DIAG:  Muscle weakness (generalized)  Other lack of coordination  Right thalamic infarction [facility])  Rationale for Evaluation and Treatment Rehabilitation  PERTINENT HISTORY: Pt s/p R CVA on 04/18/21. Pt was initially recovering very well from a stroke standpoint with mild LUE >LLE numbness and ambulating with a cane. As recovering well, on 08/04/2021 underwent CABG x4 and underwent resection of aortic valve mass most likely papillary fibroblastoma as well as endoscopic harvest of greater saphenous vein from  right leg. Therapy eval's recommended CIR for decreased functional mobility - d/c to CIR on 8/29. She required incision debridement of sternal wound and application of VAC eventually discontinued on 9/29. She was discharged home on 9/30 (after a 32 day stay).  PRECAUTIONS: Fall  SUBJECTIVE: Pt reports that her copay is becoming a challenge for therapy, so she'd like to take 6 weeks off and return in March.  Schedule updated per pt request.    PAIN:  Are you having pain? 0 pain   OBJECTIVE:    [facility] OT Assessment - 07/01/22 0001       07/01/22 08/18/22 08/27/22 11/12/22            Observation/Other Assessments       Focus on Therapeutic Outcomes (FOTO)  61  59 NT 54 (score did not convey improvement, but pt acknowledges using LUE better and more consistently with ADLs)            Coordination       Gross Motor Movements are Fluid and Coordinated No  No (improving) No (improving) No (improving)    Fine Motor Movements are Fluid and Coordinated No  No (improving) No (improving) No (not  improved from last assessment); pt has had several missed appointments since 08/27/22 d/t grandson being in the hospital.    Right 9 Hole Peg Test 31 sec  NT NT NT    Left 9 Hole Peg Test 3 min 44 sec   improved from 5 min 38 sec 2 min 50 sec (improved from 3 min 44 sec) 2 min 9 sec  2 min 16 sec            Hand Function       Right Hand Grip (lbs) 35  NT NT     Right Hand Lateral Pinch 10 lbs  NT NT     Right Hand 3 Point Pinch 7 lbs  NT NT     Left Hand Grip (lbs) 10   decline (L LF pain from triggering limiting grip strength) 23# 25# 29#    Left Hand Lateral Pinch 6 lbs  8# 9# 10#    Left 3 point pinch 5 lbs   finger slipping 8# 6# (finger slipping) 8# (finger slipping)        TODAY'S TREATMENT:    Neuro re-ed: Facilitated L GMC/FMC skills working with to place ball pegs on pegboard which was positioned on an incline.  Pt practiced sustaining a 3 point pinch pattern with each peg placement.  Facilitated diagonal reaching patterns when removing pegs from board with reposition of pegboard on table top.  Pt required intermittent rest breaks, specifically when reaching to top of pegboard with a more extended reach, and required non-skid surface for peg pick up from table top.  Further facilitated FMC/GMC skills with white board propped vertically/angled on table top; pt practiced marking a target, connecting dots, and forming "x's" and circles with thick white board marker without arm resting on table top.    PATIENT EDUCATION:  Education details: L FMC/GMC activities  Person educated: Patient Education method: Merchandiser, retail cues Education comprehension: verbalized understanding, returned demonstration, and needs further education   HOME EXERCISE PROGRAM FMC/GMC activities for LUE    OT Short Term Goals -        OT SHORT TERM GOAL #1   Title Pt will be indep to perform LUE HEP for coordination and strengthening.    Baseline Eval: not yet initiated; 06/30/22: pt  remains  consistent with HEP and avoiding repetitive gripping that causes pain in L LF; 08/18/22: pt remains consistent to engage LUE; HEP ongoing as function improves; 08/27/22: same as 08/18/22; 11/12/22: indep; pt regularly engages L hand into daily tasks and knows to maintain low reps with putty to prevent triggering of L LF PIP joint.    Time 6    Period Weeks    Status achieved   Target Date 09/29/22     OT SHORT TERM GOAL #2   Title Pt to be indep with joint protection and pain management strategies to limit triggering of L hand 3rd digit PIP.    Baseline Eval: Trigger finger splint recommended, initiated joint protection educ; 06/30/22: pt is consistent to wear her trigger finger splint and avoids repetitive gripping which causes pain to L LF PIP joint, though pt is still experiencing moderate pain in this joint with occasional triggering.  Pt plans to follow up with MD next week; 08/18/22: pt is indep with joint protection, though she states triggering has worsened since her cortisone injection, yet her pain is better.  Possibility that pt is using her hand more d/t less pain and OT reminded pt to limit repetition with gripping; 08/27/22: same as 08/18/22; 11/12/22: pt is indep with joint protection and states L LF has not triggered lately   Time 6    Period Weeks    Status achieved   Target Date 09/29/22              OT Long Term Goals -  02/04/23      OT LONG TERM GOAL #1   Title Pt will increase FOTO by 1-2 points to indicate improvement in perceived functional indep with daily tasks.    Baseline Eval: FOTO 60; 06/30/22: 61; 08/18/22: 59; 08/27/22: NT this date; 11/12/22: 54   Time 12    Period Weeks    Status ongoing   Target Date 02/04/23     OT LONG TERM GOAL #2   Title Pt will demonstrate increased L FMC/GMC to enable indep with putting hair in ponytail.    Baseline Eval: unable; 06/30/22: pt can hold her hair in a ponytail with BUEs but can not yet manage the rubber band; 08/18/22: pt can tie  hair in ponytail   Time 12    Period Weeks    Status Achieved    Target Date 08/10/22      OT LONG TERM GOAL #3   Title Pt will increase L FMC to enable indep with tying shoe laces.    Baseline Eval: unable to tie laces; wears slip on shoes; 06/30/22: pt still wearing slip on shoes; 08/18/22: pt stated she tied her laces the other day, but it took her 4 trials; 08/27/22: same as 08/18/22; 11/12/22: requires multiple trials    Time 12    Period Weeks    Status ongoing   Target Date 02/04/23     OT LONG TERM GOAL #4   Title Pt will increase L hand dexterity skills to enable pt to pick up coins from table with L hand.    Baseline Eval: unable; 06/30/22: pt can pick up coins from table with extra time.  Dexterity skills improving as noted by improved 9 hole peg test from 5 min 38 sec to 3 min 44 sec on the L; 08/18/22: Pt picks up coins from table with mild-moderate difficulty, L 9 hole 2 min 50 sec; 08/27/22: L 9 hole 2 min 9 secs;  11/12/22: 2 min 16 sec (mild-mod difficulty with picking up coins from a table)   Time 12    Period Weeks    Status ongoing   Target Date 02/04/23     OT LONG TERM GOAL #5   Title Pt will increase L grip strength by 5 or more lbs to improve ability to hold and carry ADL supplies in L hand.    Baseline Eval: L grip 17# (R 35#); 06/30/22: L grip 10# (pain in L LF significantly limiting grip as finger continues to trigger; 08/18/22: L grip 23#; 08/27/22: L grip 25#; 02/04/23: L grip 29#   Time 12    Period Weeks    Status ongoing   Target Date 02/04/23            Plan - 06/02/22 1036     Clinical Impression Statement Pt able to complete ball peg activity this date with fewer dropped pegs and improved ability to maintain a 3 point pinch pattern when grasping the pegs.  Pt reports that she continues to try and engage the LUE at home as often as she can, and feels like she's using the arm a little better.  Pt reports that her copay is becoming a challenge for therapy, so she'd  like to take 6 weeks off and return in March for a re-eval.  Schedule updated per pt request.  Pt continues to present with moderate ataxia throughout the LUE.  Pt will continue to benefit from skilled OT for increasing LUE strength and maximizing coordination skills in order to increase efficiency when engaging LUE into daily tasks.      OT Occupational Profile and History Problem Focused Assessment - Including review of records relating to presenting problem    Occupational performance deficits (Please refer to evaluation for details): ADL's;IADL's;Work    Body Structure / Function / Physical Skills ADL;GMC;UE functional use;FMC;Decreased knowledge of use of DME;Pain;Strength;Coordination;Dexterity;IADL;Sensation;Balance    Rehab Potential Good    Clinical Decision Making Limited treatment options, no task modification necessary    Comorbidities Affecting Occupational Performance: May have comorbidities impacting occupational performance    Modification or Assistance to Complete Evaluation  No modification of tasks or assist necessary to complete eval    OT Frequency 1-2x / week    OT Duration 12 weeks    OT Treatment/Interventions Self-care/ADL training;Therapeutic exercise;Therapeutic activities;Cryotherapy;Moist Heat;Neuromuscular education;DME and/or AE instruction;Manual Therapy;Splinting;Patient/family education    Plan OT focus on LUE coordination/strength training and education/splinting for L hand 3rd digit PIP triggering    OT Home Exercise Plan L GMC/FMC tasks, strengthening as tolerated   Consulted and Agree with Plan of Care Patient            Leta Speller, MS, OTR/L   Darleene Cleaver, OT 01/07/2023, 2:50 PM

## 2023-01-12 ENCOUNTER — Ambulatory Visit: Payer: PPO

## 2023-01-12 ENCOUNTER — Ambulatory Visit: Payer: Self-pay

## 2023-01-14 ENCOUNTER — Ambulatory Visit: Payer: PPO

## 2023-01-14 ENCOUNTER — Ambulatory Visit: Payer: Self-pay

## 2023-01-19 ENCOUNTER — Ambulatory Visit: Payer: Self-pay

## 2023-01-19 ENCOUNTER — Ambulatory Visit: Payer: PPO

## 2023-01-21 ENCOUNTER — Ambulatory Visit: Payer: PPO

## 2023-01-21 ENCOUNTER — Ambulatory Visit: Payer: Self-pay

## 2023-01-26 ENCOUNTER — Ambulatory Visit: Payer: PPO

## 2023-01-26 ENCOUNTER — Ambulatory Visit: Payer: Self-pay

## 2023-01-28 ENCOUNTER — Ambulatory Visit: Payer: PPO

## 2023-01-28 ENCOUNTER — Ambulatory Visit: Payer: Self-pay

## 2023-02-01 ENCOUNTER — Other Ambulatory Visit: Payer: Self-pay

## 2023-02-01 ENCOUNTER — Other Ambulatory Visit: Payer: Self-pay | Admitting: Urology

## 2023-02-01 DIAGNOSIS — N39498 Other specified urinary incontinence: Secondary | ICD-10-CM

## 2023-02-01 DIAGNOSIS — N3941 Urge incontinence: Secondary | ICD-10-CM

## 2023-02-01 DIAGNOSIS — N3000 Acute cystitis without hematuria: Secondary | ICD-10-CM

## 2023-02-01 MED ORDER — OXYBUTYNIN CHLORIDE ER 10 MG PO TB24
10.0000 mg | ORAL_TABLET | Freq: Every day | ORAL | 2 refills | Status: DC
  Filled 2023-02-01: qty 90, 90d supply, fill #0
  Filled 2023-08-18 – 2023-10-07 (×2): qty 90, 90d supply, fill #1

## 2023-02-02 ENCOUNTER — Ambulatory Visit: Payer: PPO

## 2023-02-04 ENCOUNTER — Ambulatory Visit: Payer: PPO

## 2023-02-04 ENCOUNTER — Ambulatory Visit: Payer: Self-pay

## 2023-02-09 ENCOUNTER — Ambulatory Visit: Payer: PPO

## 2023-02-09 ENCOUNTER — Ambulatory Visit: Payer: Self-pay

## 2023-02-10 ENCOUNTER — Ambulatory Visit (INDEPENDENT_AMBULATORY_CARE_PROVIDER_SITE_OTHER): Payer: PPO | Admitting: Family Medicine

## 2023-02-10 ENCOUNTER — Encounter: Payer: Self-pay | Admitting: Family Medicine

## 2023-02-10 ENCOUNTER — Telehealth: Payer: Self-pay | Admitting: Family Medicine

## 2023-02-10 ENCOUNTER — Other Ambulatory Visit: Payer: Self-pay

## 2023-02-10 VITALS — BP 164/81 | HR 72 | Temp 97.6°F | Resp 20 | Wt 180.0 lb

## 2023-02-10 DIAGNOSIS — H1032 Unspecified acute conjunctivitis, left eye: Secondary | ICD-10-CM

## 2023-02-10 MED ORDER — CIPROFLOXACIN HCL 0.3 % OP SOLN
OPHTHALMIC | 0 refills | Status: DC
  Filled 2023-02-10: qty 2.5, 7d supply, fill #0
  Filled 2023-02-10: qty 10, 7d supply, fill #0
  Filled 2023-02-10: qty 10, 10d supply, fill #0
  Filled 2023-03-15: qty 2.5, 7d supply, fill #1

## 2023-02-10 NOTE — Telephone Encounter (Signed)
This is one of my patients on Lindsay's schedule at 10am this morning. She can have one of my blocked slots if she likes. Please see if she wants my 9:20 or 10:20 slots instead.

## 2023-02-10 NOTE — Progress Notes (Signed)
I,Sulibeya S Dimas,acting as a scribe for Lelon Huh, MD.,have documented all relevant documentation on the behalf of Lelon Huh, MD,as directed by  Lelon Huh, MD while in the presence of Lelon Huh, MD.     Established patient visit   Patient: [name]   DOB: 1956-06-14   67 y.o. Female  MRN: [id number] Visit Date: 02/10/2023  Today's healthcare provider: Lelon Huh, MD   Chief Complaint  Patient presents with   Conjunctivitis   Subjective    HPI  [name] is a 67 y.o. female who presents for evaluation of discharge, erythema, itching, and pain in the left eye. She has noticed the above symptoms for several days. Onset was acute. Patient denies pain. Grandchildren have recently been dx with pink eye with similar symptoms.   Medications: Outpatient Medications Prior to Visit  Medication Sig   acetaminophen (TYLENOL) 325 MG tablet Take 2 tablets (650 mg total) by mouth every 4 (four) hours as needed for mild pain (or temp > 37.5 C (99.5 F)).   albuterol (VENTOLIN HFA) 108 (90 Base) MCG/ACT inhaler Inhale 2 puffs into the lungs every 6 (six) hours as needed for wheezing or shortness of breath.   ALPRAZolam (XANAX) 0.5 MG tablet Take 1 tablet (0.5 mg total) by mouth 2 (two) times daily as needed for anxiety   amLODipine (NORVASC) 10 MG tablet Take 1 tablet (10 mg total) by mouth daily.   aspirin 81 MG EC tablet Take 1 tablet (81 mg total) by mouth daily. Swallow whole.   Blood Glucose Monitoring Suppl (FREESTYLE FREEDOM LITE) w/Device KIT USE AS DIRECTED   cetirizine (ZYRTEC) 10 MG tablet Take 1 tablet (10 mg total) by mouth daily.   empagliflozin (JARDIANCE) 10 MG TABS tablet Take 1 tablet (10 mg total) by mouth daily before breakfast.   gabapentin (NEURONTIN) 300 MG capsule Take 1-2 capsules (300-600 mg total) by mouth at bedtime.   glucose blood (FREESTYLE LITE) test strip Also needs Lancets. Use to check blood sugar up to four times a day for insulin  dependant diabetes   glyBURIDE (DIABETA) 5 MG tablet Take 2 tablets (10 mg total) by mouth 2 (two) times daily with a meal.   guaiFENesin-dextromethorphan (ROBITUSSIN DM) 100-10 MG/5ML syrup Take 5 mLs by mouth every 4 (four) hours as needed for cough.   latanoprost (XALATAN) 0.005 % ophthalmic solution Place 1 drop into the left eye at bedtime.   losartan (COZAAR) 100 MG tablet Take 1 tablet (100 mg total) by mouth daily.   metFORMIN (GLUCOPHAGE-XR) 500 MG 24 hr tablet Take 2 tablets (1,000 mg total) by mouth 2 (two) times daily with a meal.   montelukast (SINGULAIR) 10 MG tablet Take 1 tablet (10 mg total) by mouth at bedtime.   Multiple Vitamin (MULTIVITAMIN) tablet Take 1 tablet by mouth daily.   oxybutynin (DITROPAN-XL) 10 MG 24 hr tablet Take 1 tablet (10 mg total) by mouth daily.   rosuvastatin (CRESTOR) 40 MG tablet Take 1 tablet (40 mg total) by mouth daily.   sertraline (ZOLOFT) 25 MG tablet Take 1 tablet by mouth at bedtime.   sulfamethoxazole-trimethoprim (BACTRIM DS) 800-160 MG tablet Take 1 tablet by mouth every 12 (twelve) hours.   Vitamin D, Ergocalciferol, (DRISDOL) 1.25 MG (50000 UNIT) CAPS capsule Take 1 capsule (50,000 Units total) by mouth every 7 (seven) days.   No facility-administered medications prior to visit.    Review of Systems  Constitutional:  Negative for chills and fatigue.  Eyes:  Positive for discharge, redness and itching.  Respiratory:  Negative for cough and shortness of breath.        Objective    BP (!) 164/81 (BP Location: Left Arm, Patient Position: Sitting, Cuff Size: Large)   Pulse 72   Temp 97.6 F (36.4 C) (Temporal)   Resp 20   Wt 180 lb (81.6 kg)   SpO2 100%   BMI 28.19 kg/m    Physical Exam  ENT: Conjunctiva left eye injected with clear discharge.     Assessment & Plan     1. Acute conjunctivitis of left eye, unspecified acute conjunctivitis type  - ciprofloxacin (CILOXAN) 0.3 % ophthalmic solution; Place 1 drop in  affected eye every 4 hours while awake  Dispense: 10 mL; Refill: 0   Call if symptoms change or if not rapidly improving.        The entirety of the information documented in the History of Present Illness, Review of Systems and Physical Exam were personally obtained by me. Portions of this information were initially documented by the CMA and reviewed by me for thoroughness and accuracy.     Lelon Huh, MD  [place] [phone] (phone) [phone] (fax)  [place]

## 2023-02-11 ENCOUNTER — Ambulatory Visit: Payer: Self-pay

## 2023-02-11 ENCOUNTER — Other Ambulatory Visit: Payer: Self-pay

## 2023-02-11 ENCOUNTER — Ambulatory Visit: Payer: PPO

## 2023-02-12 ENCOUNTER — Other Ambulatory Visit: Payer: Self-pay

## 2023-02-15 ENCOUNTER — Other Ambulatory Visit: Payer: Self-pay

## 2023-02-16 ENCOUNTER — Ambulatory Visit: Payer: PPO

## 2023-02-16 ENCOUNTER — Ambulatory Visit: Payer: Self-pay

## 2023-02-17 ENCOUNTER — Other Ambulatory Visit: Payer: Self-pay

## 2023-02-18 ENCOUNTER — Ambulatory Visit: Payer: Self-pay

## 2023-02-18 ENCOUNTER — Ambulatory Visit: Payer: PPO

## 2023-02-23 ENCOUNTER — Encounter: Payer: Self-pay | Admitting: Physical Medicine and Rehabilitation

## 2023-02-23 ENCOUNTER — Encounter: Payer: PPO | Attending: Physical Medicine and Rehabilitation | Admitting: Physical Medicine and Rehabilitation

## 2023-02-23 ENCOUNTER — Ambulatory Visit: Payer: PPO

## 2023-02-23 ENCOUNTER — Ambulatory Visit: Payer: Self-pay

## 2023-02-23 VITALS — BP 173/92 | HR 81 | Ht 67.0 in | Wt 178.0 lb

## 2023-02-23 DIAGNOSIS — I69354 Hemiplegia and hemiparesis following cerebral infarction affecting left non-dominant side: Secondary | ICD-10-CM | POA: Insufficient documentation

## 2023-02-23 DIAGNOSIS — F419 Anxiety disorder, unspecified: Secondary | ICD-10-CM | POA: Diagnosis not present

## 2023-02-23 DIAGNOSIS — Z951 Presence of aortocoronary bypass graft: Secondary | ICD-10-CM | POA: Insufficient documentation

## 2023-02-23 DIAGNOSIS — M6530 Trigger finger, unspecified finger: Secondary | ICD-10-CM | POA: Diagnosis not present

## 2023-02-23 DIAGNOSIS — R296 Repeated falls: Secondary | ICD-10-CM | POA: Insufficient documentation

## 2023-02-23 NOTE — Progress Notes (Signed)
Subjective:    Patient ID: [name], female    DOB: October 19, 1956, 67 y.o.   MRN: [id number]  HPI  [name] is a 67 year old woman who presents for follow-up s/p CABG and stroke and falls.   1) Trigger finger -she had an injection in the past and this really helped -our repeat injection helped a lot  2) CVA -She has been doing well at home but has not yet received and PT, OT, or nursing services. She says that SW is aware and working on this.  -she gets long term disability -she would like to return to work -[employer] said she was too high level  3) s/p CABG -incision wound healed -still hurts when she uses a seat belt  4) Anxiety -improved -she takes Xanax.  -it is only written for 45 days.  -she was started on this when she was still working.   5) Multiple falls: -fell in the lobby of [employer]  6) depression: improved   She has been doing her dressing changes and inspecting her wound daily  She does not need any refills of her medications.  BP is currently well controlled  Has not been walking as much as when she was in the hospital.  She has struggled a lot with her disability insurance and this has impacted her financially. She shares some of these concerns with me today    Pain Inventory Average Pain 0 Pain Right Now 0 My pain is  no pain  In the last 24 hours, has pain interfered with the following? General activity 0 Relation with others 0 Enjoyment of life 0 What TIME of day is your pain at its worst? no pain Sleep (in general) Good  Pain is worse with: no pain Pain improves with: no pain Relief from Meds: no pain  walk with assistance use a walker  disabled: date disabled short term on leave from [place];  weakness trouble walking anxiety  Any changes since last visit?  no  Any changes since last visit?  no    Family History  Problem Relation Age of Onset   Bone cancer Brother    Aneurysm Mother        brain    Diabetes Father    CAD Father    Heart failure Father    Colon cancer Father    Cancer Father    Alzheimer's disease Paternal Grandmother    Dementia Paternal Grandmother    Mental illness Paternal Grandfather    Healthy Daughter    Healthy Son    Breast cancer Neg Hx    Social History   Socioeconomic History   Marital status: Widowed    Spouse name: Not on file   Number of children: 2   Years of education: Not on file   Highest education level: 12th grade  Occupational History    Employer: [employer]  Tobacco Use   Smoking status: Former    Packs/day: 1.50    Years: 25.00    Total pack years: 37.50    Types: Cigarettes    Quit date: 03/24/2007    Years since quitting: 15.9   Smokeless tobacco: Never  Vaping Use   Vaping Use: Never used  Substance and Sexual Activity   Alcohol use: Not Currently   Drug use: No   Sexual activity: Not Currently    Birth control/protection: Post-menopausal  Other Topics Concern   Not on file  Social History Narrative   Not  on file   Social Determinants of Health   Financial Resource Strain: Not on file  Food Insecurity: Not on file  Transportation Needs: Not on file  Physical Activity: Not on file  Stress: Not on file  Social Connections: Not on file   Past Surgical History:  Procedure Laterality Date   AORTIC VALVE REPLACEMENT N/A 08/04/2021   Procedure: RESECTION AORTIC VALVE TUMOR;  Surgeon: [name], MD;  Location: [place];  Service: Open Heart Surgery;  Laterality: N/A;   APPLICATION OF WOUND VAC N/A 08/24/2021   Procedure: APPLICATION OF WOUND VAC;  Surgeon: Gaye Pollack, MD;  Location: [place] [facility];  Service: Thoracic;  Laterality: N/A;   APPLICATION OF WOUND VAC N/A 08/28/2021   Procedure: WOUND VAC CHANGE;  Surgeon: [name], MD;  Location: [place];  Service: Thoracic;  Laterality: N/A;   BREAST EXCISIONAL BIOPSY Right 1999   neg   BUBBLE STUDY  04/24/2021   Procedure: BUBBLE STUDY;  Surgeon:  Sueanne Margarita, MD;  Location: [place];  Service: Cardiovascular;;   CATARACT EXTRACTION  12/2011   CATARACT EXTRACTION W/PHACO Left 12/02/2016   Procedure: CATARACT EXTRACTION PHACO AND INTRAOCULAR LENS PLACEMENT ([place]);  Surgeon: Estill Cotta, MD;  Location: [facility];  Service: Ophthalmology;  Laterality: Left;  [place] 2:14 AP% 28.4 CDE 59.73 Fluid pack lot # [id number]   COLONOSCOPY WITH PROPOFOL N/A 04/13/2022   Procedure: COLONOSCOPY WITH PROPOFOL;  Surgeon: Lin Landsman, MD;  Location: [facility] ENDOSCOPY;  Service: Gastroenterology;  Laterality: N/A;   CORONARY ARTERY BYPASS GRAFT N/A 08/04/2021   Procedure: CORONARY ARTERY BYPASS GRAFTING (CABG) X  4  USING LEFT INTERNAL MAMMARY ARTERY AND RIGHT GREATER SAPHENOUS VEIN ENDOSCOPIC CONDUITS;  Surgeon: [name], MD;  Location: [place];  Service: Open Heart Surgery;  Laterality: N/A;   DIAGNOSTIC LAPAROSCOPY     EYE SURGERY     IR CT HEAD LTD  04/19/2021   IR PERCUTANEOUS ART THROMBECTOMY/INFUSION INTRACRANIAL INC DIAG ANGIO  04/19/2021       IR PERCUTANEOUS ART THROMBECTOMY/INFUSION INTRACRANIAL INC DIAG ANGIO  04/19/2021   IR US GUIDE VASC ACCESS LEFT  04/19/2021   RADIOLOGY WITH ANESTHESIA N/A 04/19/2021   Procedure: IR WITH ANESTHESIA;  Surgeon: Luanne Bras, MD;  Location: [place];  Service: Radiology;  Laterality: N/A;   STERNAL WOUND DEBRIDEMENT N/A 08/24/2021   Procedure: STERNAL WOUND DRAINAGE AND DEBRIDEMENT;  Surgeon: Gaye Pollack, MD;  Location: [place] [facility];  Service: Thoracic;  Laterality: N/A;   STERNAL WOUND DEBRIDEMENT N/A 08/28/2021   Procedure: STERNAL WOUND DEBRIDEMENT;  Surgeon: [name], MD;  Location: [place];  Service: Thoracic;  Laterality: N/A;   TEE WITHOUT CARDIOVERSION N/A 04/24/2021   Procedure: TRANSESOPHAGEAL ECHOCARDIOGRAM (TEE);  Surgeon: Sueanne Margarita, MD;  Location: [facility] ENDOSCOPY;  Service: Cardiovascular;  Laterality: N/A;   TEE WITHOUT CARDIOVERSION N/A 08/04/2021    Procedure: TRANSESOPHAGEAL ECHOCARDIOGRAM (TEE);  Surgeon: [name], MD;  Location: [place];  Service: Open Heart Surgery;  Laterality: N/A;   TOTAL HIP ARTHROPLASTY Right 04/04/2018   Procedure: TOTAL HIP ARTHROPLASTY;  Surgeon: Dereck Leep, MD;  Location: [facility];  Service: Orthopedics;  Laterality: Right;   Past Medical History:  Diagnosis Date   Anxiety    Arthritis    CAD (coronary artery disease)    a. 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 s/p CABG x4 LIMA to LAD, SVG to PDA, and Sequential SVG to OM and Diagonal on 8/   Stroke ([place]) 04/2021   admitted at [facility] - left sided weakness   Urinary incontinence    as stated by patient   There were no vitals taken for this visit.  Opioid Risk Score:   Fall Risk Score:  `1  Depression screen [facility] 2/9     02/10/2023   10:39 AM 10/16/2022    1:18 PM 03/09/2022    1:27 PM 09/26/2021   12:10 PM 03/20/2021   10:30 AM 09/20/2020   10:22 AM 08/24/2019    2:10 PM  Depression screen PHQ 2/9  Decreased Interest '3 1 1 1 1 '$ 0 1  Down, Depressed, Hopeless '3 1 1 1 1 1 '$ 0  PHQ - 2 Score '6 2 2 2 2 1 1  '$ Altered sleeping '3 1 2 '$ 0 0 1 1  Tired, decreased energy '1 1 2 2 1 1 1  '$ Change in appetite '3 1 1 3 1 '$ 0 0  Feeling bad or failure about yourself  0 '1 2 1 '$ 0 0 0  Trouble concentrating 0 0 0 0 0 0 0  Moving slowly or fidgety/restless 0 0 0 0 1 0 0  Suicidal thoughts 1 0 1 0 0 1 0  PHQ-9 Score '14 6 10 8 5  4 3  '$ Difficult doing work/chores Not difficult at all Somewhat difficult Somewhat difficult  Not difficult at all Not difficult at all Not difficult at all     Review of Systems  Constitutional:  Positive for unexpected weight change.       Wt loss  HENT: Negative.    Eyes: Negative.   Respiratory: Negative.    Cardiovascular: Negative.   Gastrointestinal: Negative.   Endocrine: Negative.   Genitourinary: Negative.   Musculoskeletal:  Positive for gait problem.  Skin: Negative.   Allergic/Immunologic: Negative.   Neurological:  Positive for weakness.  Hematological: Negative.   Psychiatric/Behavioral:  Positive for dysphoric mood. The patient is nervous/anxious.   All other systems reviewed and are negative.      Objective:   Physical Exam Gen: no distress, normal appearing, BMI 27.88, BP 173/92 HEENT: oral mucosa pink and moist, NCAT Cardio: Reg rate Chest: normal effort, normal rate of breathing Abd: soft, non-distended Ext: no edema Psych: pleasant, normal affect Skin: intact Neuro: Alert and oriented Musculoskeletal: left sided weakness, trigger finger left third digit, left hand numbness, ambulating with cane       Assessment & Plan:   1) s/p CABG -provided with handicap placard -provided with note saying that she cannot wear her seatbelt at all times because of irritation of the skin incision -referred to cardiology -messaged SW regarding getting her outpatient therapies -would examined- recommended continued daily wound inspection -she has struggled with getting disability payments and this has impacted her financially, discussed this with her  today, provided a note that reflects all the conditions for which she was treated while she was she was inpatient rehab as she is having to argue to get coverage for inpatient rehab as well  2) Left sided neuropathic pain -much improved -Discussed benefits of exercise in reducing pain. Recommend topical CBD oil-  discussed its benefits in reducing inflammation, pain, insomnia, and anxiety.  -Discussed that CBD oil differs from marijuana in that it does not contain THC- the substance that causes euphoria.  -Commended on stopping Gabapentin -Discussed that it is made from the hemp plant.  -It has been used for thousands of years -preliminary research suggests that if may be able to shrink cancerous tumors, stop plaque formation in Alzheimer's Disease, and slow the progress of brain disease from concussions.  -Additional benefits that have been demonstrated in studies include improved nausea, indigestion, and brain health, and reduced seizures.  -In a survey 92% of patients who tried medical cannabis felt it improved symptoms such as chronic pain, arthritis, migraines, and cancer.    -Discussed following foods that may reduce pain: 1) Ginger (especially studied for arthritis)- reduce leukotriene production to decrease inflammation 2) Blueberries- high in phytonutrients that decrease inflammation 3) Salmon- marine omega-3s reduce joint swelling and pain 4) Pumpkin seeds- reduce inflammation 5) dark chocolate- reduces inflammation 6) turmeric- reduces inflammation 7) tart cherries - reduce pain and stiffness 8) extra virgin olive oil - its compound olecanthal helps to block prostaglandins  9) chili peppers- can be eaten or applied topically via capsaicin 10) mint- helpful for headache, muscle aches, joint pain, and itching 11) garlic- reduces inflammation  Link to further information on diet for chronic pain: [email or web address]   3) HTN: -discussed improvement in blood pressure -continue to follow-up with PCP -Advised checking BP daily at home and logging results to bring into follow-up appointment with her PCP and myself. -Reviewed BP meds today.  -Advised regarding healthy foods that can help lower blood pressure and provided  with a list: 1) citrus foods- high in vitamins and minerals 2) salmon and other fatty fish - reduces inflammation and oxylipins 3) swiss chard (leafy green)- high level of nitrates 4) pumpkin seeds- one of the best natural sources of magnesium 5) Beans and lentils- high in fiber, magnesium, and potassium 6) Berries- high in flavonoids 7) Amaranth (whole grain, can be cooked similarly to rice and oats)- high in magnesium and fiber 8) Pistachios- even more effective at reducing BP than other nuts 9) Carrots- high in phenolic compounds that relax blood vessels and reduce inflammation 10) Celery- contain phthalides that relax tissues of arterial walls 11) Tomatoes- can also improve cholesterol and reduce risk of heart disease 12) Broccoli- good source of magnesium, calcium, and potassium 13) Greek yogurt: high in potassium and calcium 14) Herbs and spices: Celery seed, cilantro, saffron, lemongrass, black cumin, ginseng, cinnamon, cardamom, sweet basil, and ginger 15) Chia and flax seeds- also help to lower cholesterol and blood sugar 16) Beets- high levels of nitrates that relax blood vessels  17) spinach and bananas- high in potassium  -Provided lise of supplements that can help with hypertension:  1) magnesium: one high quality brand is Bioptemizers since it contains all 7 types of magnesium, otherwise over the counter magnesium gluconate '400mg'$  is a good option 2) B vitamins 3) vitamin D 4) potassium 5) CoQ10 6) L-arginine 7) Vitamin C 8) Beetroot -Educated that goal BP is 120/80. -Made goal to incorporate some of the above foods into diet.  4) Diabetes: -check CBGs daily, log, and bring log to follow-up appointment -avoid sugar, bread, pasta, rice -avoid snacking -perform daily foot exam and at least annual eye exam -try to incorporate into your diet some of the following foods which are good for diabetes: 1) cinnamon- imitates effects of insulin, increasing glucose  transport into cells ([place] or [place] cinnamon is best, least processed) 2) nuts- can slow down the blood sugar response of carbohydrate rich foods 3) oatmeal- contains and anti-inflammatory compound avenanthramide 4) whole-milk yogurt (best types are no sugar, [place] yogurt, or goat/sheep yogurt) 5) beans- high in protein, fiber, and vitamins, low glycemic index 6) broccoli- great source of vitamin A and C 7) quinoa- higher in protein and fiber than other grains 8) spinach- high in vitamin A, fiber, and protein 9) olive oil- reduces glucose levels, LDL, and triglycerides 10) salmon- excellent amount of omega-3-fatty acids 11) walnuts- rich in antioxidants 12) apples- high in fiber and quercetin 13) carrots- highly nutritious with low impact on blood sugar 14) eggs- improve HDL (good cholesterol), high in protein, keep you satiated 15) turmeric: improves blood sugars, cardiovascular disease, and protects kidney health 16) garlic: improves blood sugar, blood pressure, pain 17) tomatoes: highly nutritious with low impact on blood sugar   5) Trigger finger -referred to sports medicine  6) Left hand weakness: -Prescribed Zynex Nexwave  7) Anxiety: -Renewed Xanax 0.'5mg'$  BID -Discussed exercise and meditation as tools to decrease anxiety. -Recommended Down Dog Yoga app -Discussed spending time outdoors. -Discussed positive re-framing of anxiety.  -Discussed the following foods that have been show to reduce anxiety: 1) [place] nuts, mushrooms, soy beans due to their high selenium content. Upper limit of toxicity of selenium is 472mg/day so no more than 3-4 bBolivianuts per day.  2) Fatty fish such as salmon, mackerel, sardines, trout, and herring- high in omega-3 fatty acids 3) Eggs- increases serotonin and dopamine 4) Pumpkin seeds- high in omega-3 fatty acids 5) dark chocolate- high in flavanols that increase blood flow to brain 6) turmeric- take with black pepper to increase  absorption 7) chamomile tea- antioxidant and anti-inflammatory properties 8) yogurt without sugar- supports gut-brain axis 9) green tea- contains L- theanine 10) blueberries- high in vitamin C and antioxidants 11) t[place] high in tryptophan which gets converted to serotonin 12) bell peppers- rich in vitamin C and antioxidants 13) citrus fruits- rich in vitamin C and antioxidants 14) almonds- high in vitamin E and healthy fats 15) chia seeds- high in omega-3 fatty acids  5) post- stroke numbness -discussed continued numbness in her face and hand  6) Urinary incontinence -prescribed myrbetriq -referred to urology

## 2023-02-23 NOTE — Progress Notes (Signed)
Subjective:    Patient ID: [name], female    DOB: 09/17/1956, 67 y.o.   MRN: [id number]  HPI  [name] is a 67 year old woman who presents for follow-up s/p CABG and stroke  1) Trigger finger -she had an injection in the past and this really helped -she would like a repeat injection -initially felt worse after injection, and then after a week it stopped at all.   2) CVA -She has been doing well at home but has not yet received and PT, OT, or nursing services. She says that SW is aware and working on this.  -she gets long term disability -she would like to return to work -she is interested in [employer] trial  3) s/p CABG -incision wound healed  4) Anxiety -she takes Xanax.  -it is only written for 45 days.  -she was started on this when she was still working.     She has been doing her dressing changes and inspecting her wound daily  She does not need any refills of her medications.  BP is currently well controlled  Has not been walking as much as when she was in the hospital.  She has struggled a lot with her disability insurance and this has impacted her financially. She shares some of these concerns with me today    Pain Inventory Average Pain 0 Pain Right Now 0 My pain is  no pain  In the last 24 hours, has pain interfered with the following? General activity 0 Relation with others 0 Enjoyment of life 0 What TIME of day is your pain at its worst? no pain Sleep (in general) Good  Pain is worse with: no pain Pain improves with: no pain Relief from Meds: no pain  walk with assistance use a walker  disabled: date disabled short term on leave from [employer];  weakness trouble walking anxiety  Any changes since last visit?  no  Any changes since last visit?  no   Bowel: 7 bowel movements per day.  Bladder: Wear Depends  Mobility: Walks with a cane, Climbs steps, Currently Drives  Neuro / Psych Bladder Control, Numbness,  Weakness, Trouble Walking, Depression, Anxiety      Family History  Problem Relation Age of Onset   Bone cancer Brother    Aneurysm Mother        brain   Diabetes Father    CAD Father    Heart failure Father    Colon cancer Father    Cancer Father    Alzheimer's disease Paternal Grandmother    Dementia Paternal Grandmother    Mental illness Paternal Grandfather    Healthy Daughter    Healthy Son    Breast cancer Neg Hx    Social History   Socioeconomic History   Marital status: Widowed    Spouse name: Not on file   Number of children: 2   Years of education: Not on file   Highest education level: 12th grade  Occupational History    Employer: [employer]  Tobacco Use   Smoking status: Former    Packs/day: 1.50    Years: 25.00    Total pack years: 37.50    Types: Cigarettes    Quit date: 03/24/2007    Years since quitting: 15.9   Smokeless tobacco: Never  Vaping Use   Vaping Use: Never used  Substance and Sexual Activity   Alcohol use: Not Currently   Drug use: No   Sexual  activity: Not Currently    Birth control/protection: Post-menopausal  Other Topics Concern   Not on file  Social History Narrative   Not on file   Social Determinants of Health   Financial Resource Strain: Not on file  Food Insecurity: Not on file  Transportation Needs: Not on file  Physical Activity: Not on file  Stress: Not on file  Social Connections: Not on file   Past Surgical History:  Procedure Laterality Date   AORTIC VALVE REPLACEMENT N/A 08/04/2021   Procedure: RESECTION AORTIC VALVE TUMOR;  Surgeon: [name], MD;  Location: [place];  Service: Open Heart Surgery;  Laterality: N/A;   APPLICATION OF WOUND VAC N/A 08/24/2021   Procedure: APPLICATION OF WOUND VAC;  Surgeon: Gaye Pollack, MD;  Location: [place] [facility];  Service: Thoracic;  Laterality: N/A;   APPLICATION OF WOUND VAC N/A 08/28/2021   Procedure: WOUND VAC CHANGE;  Surgeon: [name], MD;   Location: [place];  Service: Thoracic;  Laterality: N/A;   BREAST EXCISIONAL BIOPSY Right 1999   neg   BUBBLE STUDY  04/24/2021   Procedure: BUBBLE STUDY;  Surgeon: Sueanne Margarita, MD;  Location: [place];  Service: Cardiovascular;;   CATARACT EXTRACTION  12/2011   CATARACT EXTRACTION W/PHACO Left 12/02/2016   Procedure: CATARACT EXTRACTION PHACO AND INTRAOCULAR LENS PLACEMENT ([place]);  Surgeon: Estill Cotta, MD;  Location: [facility];  Service: Ophthalmology;  Laterality: Left;  [place] 2:14 AP% 28.4 CDE 59.73 Fluid pack lot # [id number]   COLONOSCOPY WITH PROPOFOL N/A 04/13/2022   Procedure: COLONOSCOPY WITH PROPOFOL;  Surgeon: Lin Landsman, MD;  Location: [facility];  Service: Gastroenterology;  Laterality: N/A;   CORONARY ARTERY BYPASS GRAFT N/A 08/04/2021   Procedure: CORONARY ARTERY BYPASS GRAFTING (CABG) X  4  USING LEFT INTERNAL MAMMARY ARTERY AND RIGHT GREATER SAPHENOUS VEIN ENDOSCOPIC CONDUITS;  Surgeon: [name], MD;  Location: [place];  Service: Open Heart Surgery;  Laterality: N/A;   DIAGNOSTIC LAPAROSCOPY     EYE SURGERY     IR CT HEAD LTD  04/19/2021   IR PERCUTANEOUS ART THROMBECTOMY/INFUSION INTRACRANIAL INC DIAG ANGIO  04/19/2021       IR PERCUTANEOUS ART THROMBECTOMY/INFUSION INTRACRANIAL INC DIAG ANGIO  04/19/2021   IR US GUIDE VASC ACCESS LEFT  04/19/2021   RADIOLOGY WITH ANESTHESIA N/A 04/19/2021   Procedure: IR WITH ANESTHESIA;  Surgeon: Luanne Bras, MD;  Location: [place];  Service: Radiology;  Laterality: N/A;   STERNAL WOUND DEBRIDEMENT N/A 08/24/2021   Procedure: STERNAL WOUND DRAINAGE AND DEBRIDEMENT;  Surgeon: Gaye Pollack, MD;  Location: [place] [facility];  Service: Thoracic;  Laterality: N/A;   STERNAL WOUND DEBRIDEMENT N/A 08/28/2021   Procedure: STERNAL WOUND DEBRIDEMENT;  Surgeon: [name], MD;  Location: [place];  Service: Thoracic;  Laterality: N/A;   TEE WITHOUT CARDIOVERSION N/A 04/24/2021   Procedure: TRANSESOPHAGEAL  ECHOCARDIOGRAM (TEE);  Surgeon: Sueanne Margarita, MD;  Location: [facility];  Service: Cardiovascular;  Laterality: N/A;   TEE WITHOUT CARDIOVERSION N/A 08/04/2021   Procedure: TRANSESOPHAGEAL ECHOCARDIOGRAM (TEE);  Surgeon: [name], MD;  Location: [place];  Service: Open Heart Surgery;  Laterality: N/A;   TOTAL HIP ARTHROPLASTY Right 04/04/2018   Procedure: TOTAL HIP ARTHROPLASTY;  Surgeon: Dereck Leep, MD;  Location: [facility];  Service: Orthopedics;  Laterality: Right;   Past Medical History:  Diagnosis Date   Anxiety    Arthritis    CAD (coronary artery disease)    a. 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 s/p CABG x4 LIMA to LAD, SVG to PDA, and Sequential SVG to OM and Diagonal on 8/   Stroke ([place]) 04/2021   admitted at [facility] - left sided weakness   Urinary incontinence    as stated by patient   Ht '5\' 7"'$  (1.702 m)   Wt 178 lb (80.7 kg)   BMI 27.88 kg/m   Opioid Risk Score:   Fall Risk Score:  `1  Depression screen [facility] 2/9     02/10/2023   10:39 AM 10/16/2022    1:18 PM 03/09/2022    1:27 PM 09/26/2021   12:10 PM 03/20/2021   10:30 AM 09/20/2020   10:22 AM 08/24/2019    2:10 PM  Depression screen PHQ 2/9  Decreased Interest '3 1 1 1 1 '$ 0 1  Down, Depressed, Hopeless '3 1 1 1 1 1 '$ 0  PHQ - 2 Score '6 2 2 2 2 1 1  '$ Altered sleeping '3 1 2 '$ 0 0 1 1  Tired, decreased energy '1 1 2 2 1 1 1  '$ Change in appetite '3 1 1 3 1 '$ 0 0  Feeling bad  or failure about yourself  0 '1 2 1 '$ 0 0 0  Trouble concentrating 0 0 0 0 0 0 0  Moving slowly or fidgety/restless 0 0 0 0 1 0 0  Suicidal thoughts 1 0 1 0 0 1 0  PHQ-9 Score '14 6 10 8 5 4 3  '$ Difficult doing work/chores Not difficult at all Somewhat difficult Somewhat difficult  Not difficult at all Not difficult at all Not difficult at all     Review of Systems  Constitutional:  Positive for unexpected weight change.       Wt loss  HENT: Negative.    Eyes: Negative.   Respiratory: Negative.    Cardiovascular: Negative.   Gastrointestinal: Negative.   Endocrine: Negative.   Genitourinary: Negative.        OAB  Musculoskeletal:  Positive for gait problem.  Skin: Negative.   Allergic/Immunologic: Negative.   Neurological:  Positive for weakness and numbness.       Tingling  Hematological: Negative.   Psychiatric/Behavioral:  Positive for dysphoric mood. The patient is nervous/anxious.        Depression  All other systems reviewed and are negative.      Objective:   Physical Exam Gen: no distress, normal appearing, BMI 27.72, BP 199/108 HEENT: oral mucosa pink and moist, NCAT Cardio: Reg rate Chest: normal effort, normal rate of breathing Abd: soft, non-distended Ext: no edema Psych: pleasant, normal affect Skin: intact Neuro: Alert and oriented Musculoskeletal: left sided weakness, trigger finger left third digit, left hand numbness       Assessment & Plan:   1) s/p CABG -provided with handicap  placard -referred to cardiology -messaged SW regarding getting her outpatient therapies -would examined- recommended continued daily wound inspection -she has struggled with getting disability payments and this has impacted her financially, discussed this with her today, provided a note that reflects all the conditions for which she was treated while she was she was inpatient rehab as she is having to argue to get coverage for inpatient rehab as well  2) Left sided  neuropathic pain -Discussed benefits of exercise in reducing pain. Recommend topical CBD oil- discussed its benefits in reducing inflammation, pain, insomnia, and anxiety.  -Discussed that CBD oil differs from marijuana in that it does not contain THC- the substance that causes euphoria.  -Commended on stopping Gabapentin -Discussed that it is made from the hemp plant.  -It has been used for thousands of years -preliminary research suggests that if may be able to shrink cancerous tumors, stop plaque formation in Alzheimer's Disease, and slow the progress of brain disease from concussions.  -Additional benefits that have been demonstrated in studies include improved nausea, indigestion, and brain health, and reduced seizures.  -In a survey 92% of patients who tried medical cannabis felt it improved symptoms such as chronic pain, arthritis, migraines, and cancer.    -Discussed following foods that may reduce pain: 1) Ginger (especially studied for arthritis)- reduce leukotriene production to decrease inflammation 2) Blueberries- high in phytonutrients that decrease inflammation 3) Salmon- marine omega-3s reduce joint swelling and pain 4) Pumpkin seeds- reduce inflammation 5) dark chocolate- reduces inflammation 6) turmeric- reduces inflammation 7) tart cherries - reduce pain and stiffness 8) extra virgin olive oil - its compound olecanthal helps to block prostaglandins  9) chili peppers- can be eaten or applied topically via capsaicin 10) mint- helpful for headache, muscle aches, joint pain, and itching 11) garlic- reduces inflammation  Link to further information on diet for chronic pain: [email or web address]   3) HTN: -BP is very elevated today at 199/108, discussed that she did not take her medicine this morning and had anxiety coming here -Advised checking BP daily at home and logging results to bring into follow-up  appointment with her PCP and myself. -Reviewed BP meds today.  -Advised regarding healthy foods that can help lower blood pressure and provided with a list: 1) citrus foods- high in vitamins and minerals 2) salmon and other fatty fish - reduces inflammation and oxylipins 3) swiss chard (leafy green)- high level of nitrates 4) pumpkin seeds- one of the best natural sources of magnesium 5) Beans and lentils- high in fiber, magnesium, and potassium 6) Berries- high in flavonoids 7) Amaranth (whole grain, can be cooked similarly to rice and oats)- high in magnesium and fiber 8) Pistachios- even more effective at reducing BP than other nuts 9) Carrots- high in phenolic compounds that relax blood vessels and reduce inflammation 10) Celery- contain phthalides that relax tissues of arterial walls 11) Tomatoes- can also improve cholesterol and reduce risk of heart disease 12) Broccoli- good source of magnesium, calcium, and potassium 13) Greek yogurt: high in potassium and calcium 14) Herbs and spices: Celery seed, cilantro, saffron, lemongrass, black cumin, ginseng, cinnamon, cardamom, sweet basil, and ginger 15) Chia and flax seeds- also help to lower cholesterol and blood sugar 16) Beets- high levels of nitrates that relax blood vessels  17) spinach and bananas- high in potassium  -Provided lise of supplements that can help with hypertension:  1) magnesium: one high quality brand is Bioptemizers since it contains all 7 types of magnesium, otherwise over  the counter magnesium gluconate '400mg'$  is a good option 2) B vitamins 3) vitamin D 4) potassium 5) CoQ10 6) L-arginine 7) Vitamin C 8) Beetroot -Educated that goal BP is 120/80. -Made goal to incorporate some of the above foods into diet.      4) Diabetes: -check CBGs daily, log, and bring log to follow-up appointment -avoid sugar, bread, pasta, rice -avoid snacking -perform daily foot exam and at least annual eye exam -try to  incorporate into your diet some of the following foods which are good for diabetes: 1) cinnamon- imitates effects of insulin, increasing glucose transport into cells ([place] or [place] cinnamon is best, least processed) 2) nuts- can slow down the blood sugar response of carbohydrate rich foods 3) oatmeal- contains and anti-inflammatory compound avenanthramide 4) whole-milk yogurt (best types are no sugar, [place] yogurt, or goat/sheep yogurt) 5) beans- high in protein, fiber, and vitamins, low glycemic index 6) broccoli- great source of vitamin A and C 7) quinoa- higher in protein and fiber than other grains 8) spinach- high in vitamin A, fiber, and protein 9) olive oil- reduces glucose levels, LDL, and triglycerides 10) salmon- excellent amount of omega-3-fatty acids 11) walnuts- rich in antioxidants 12) apples- high in fiber and quercetin 13) carrots- highly nutritious with low impact on blood sugar 14) eggs- improve HDL (good cholesterol), high in protein, keep you satiated 15) turmeric: improves blood sugars, cardiovascular disease, and protects kidney health 16) garlic: improves blood sugar, blood pressure, pain 17) tomatoes: highly nutritious with low impact on blood sugar   5) Trigger finger -referred to sports medicine  6) Left hand weakness: -referred for Vivistim  7) Anxiety: -Renewed Xanax 0.'5mg'$  BID -Discussed exercise and meditation as tools to decrease anxiety. -Recommended Down Dog Yoga app -Discussed spending time outdoors. -Discussed positive re-framing of anxiety.  -Discussed the following foods that have been show to reduce anxiety: 1) [place] nuts, mushrooms, soy beans due to their high selenium content. Upper limit of toxicity of selenium is 456mg/day so no more than 3-4 bBolivianuts per day.  2) Fatty fish such as salmon, mackerel, sardines, trout, and herring- high in omega-3 fatty acids 3) Eggs- increases serotonin and dopamine 4) Pumpkin seeds- high in  omega-3 fatty acids 5) dark chocolate- high in flavanols that increase blood flow to brain 6) turmeric- take with black pepper to increase absorption 7) chamomile tea- antioxidant and anti-inflammatory properties 8) yogurt without sugar- supports gut-brain axis 9) green tea- contains L- theanine 10) blueberries- high in vitamin C and antioxidants 11) [place] high in tryptophan which gets converted to serotonin 12) bell peppers- rich in vitamin C and antioxidants 13) citrus fruits- rich in vitamin C and antioxidants 14) almonds- high in vitamin E and healthy fats 15) chia seeds- high in omega-3 fatty acids  5) post- stroke numbness -discussed continued numbness in her face and hand  6) Urinary incontinence -prescribed myrbetriq -referred to urology

## 2023-02-25 ENCOUNTER — Ambulatory Visit: Payer: PPO

## 2023-02-25 ENCOUNTER — Ambulatory Visit: Payer: Self-pay

## 2023-03-02 ENCOUNTER — Ambulatory Visit: Payer: PPO

## 2023-03-02 ENCOUNTER — Ambulatory Visit: Payer: Self-pay

## 2023-03-03 ENCOUNTER — Ambulatory Visit: Payer: PPO

## 2023-03-04 ENCOUNTER — Ambulatory Visit: Payer: PPO

## 2023-03-04 ENCOUNTER — Ambulatory Visit: Payer: Self-pay

## 2023-03-05 ENCOUNTER — Ambulatory Visit: Payer: PPO | Attending: Adult Health

## 2023-03-05 DIAGNOSIS — I6381 Other cerebral infarction due to occlusion or stenosis of small artery: Secondary | ICD-10-CM | POA: Insufficient documentation

## 2023-03-05 DIAGNOSIS — M6281 Muscle weakness (generalized): Secondary | ICD-10-CM | POA: Diagnosis not present

## 2023-03-05 DIAGNOSIS — R278 Other lack of coordination: Secondary | ICD-10-CM | POA: Diagnosis not present

## 2023-03-05 NOTE — Therapy (Signed)
OUTPATIENT OCCUPATIONAL THERAPY NEURO TREATMENT NOTE    Patient Name: [name] MRN: [id number] DOB:1956/03/17, 67 y.o., female Today's Date: 03/05/2023  PCP: Dr. Birdie Sons REFERRING PROVIDER: Frann Rider, NP       Past Medical History:  Diagnosis Date   Anxiety    Arthritis    CAD (coronary artery disease)    a. 3 vessel CAD noted on coronary CTA 04/2021, s/p CABG x4   Cataract    left   Depression    Diabetes mellitus without complication ([place])    type 2   Environmental and seasonal allergies    GERD (gastroesophageal reflux disease)    Hyperlipidemia    Hypertension    Joint pain    as reported by patient   Papillary fibroelastoma of heart    of the aortic valve,  s/p AVR and tumor resection on 08/04/2021   Right thalamic infarction ([place]) 04/29/2021   S/P AVR (aortic valve replacement)    s/p AVR with resection of AV fibroelastoma on 08/04/2021   S/P CABG x 4    a. s/p CABG x4 LIMA to LAD, SVG to PDA, and Sequential SVG to OM and Diagonal on 8/   Stroke ([place]) 04/2021   admitted at [facility] - left sided weakness   Urinary incontinence    as stated by patient   Past Surgical History:  Procedure Laterality Date   AORTIC VALVE REPLACEMENT N/A 08/04/2021   Procedure: RESECTION AORTIC VALVE TUMOR;  Surgeon: [name], MD;  Location: [place];  Service: Open Heart Surgery;  Laterality: N/A;   APPLICATION OF WOUND VAC N/A 08/24/2021   Procedure: APPLICATION OF WOUND VAC;  Surgeon: Gaye Pollack, MD;  Location: [place] OR;  Service: Thoracic;  Laterality: N/A;   APPLICATION OF WOUND VAC N/A 08/28/2021   Procedure: WOUND VAC CHANGE;  Surgeon: [name], MD;  Location: [place];  Service: Thoracic;  Laterality: N/A;   BREAST EXCISIONAL BIOPSY Right 1999   neg   BUBBLE STUDY  04/24/2021   Procedure: BUBBLE STUDY;  Surgeon: Sueanne Margarita, MD;  Location: [place];  Service: Cardiovascular;;   CATARACT EXTRACTION  12/2011   CATARACT  EXTRACTION W/PHACO Left 12/02/2016   Procedure: CATARACT EXTRACTION PHACO AND INTRAOCULAR LENS PLACEMENT ([place]);  Surgeon: Estill Cotta, MD;  Location: [facility];  Service: Ophthalmology;  Laterality: Left;  [place] 2:14 AP% 28.4 CDE 59.73 Fluid pack lot # [id number]   COLONOSCOPY WITH PROPOFOL N/A 04/13/2022   Procedure: COLONOSCOPY WITH PROPOFOL;  Surgeon: Lin Landsman, MD;  Location: [facility]COPY;  Service: Gastroenterology;  Laterality: N/A;   CORONARY ARTERY BYPASS GRAFT N/A 08/04/2021   Procedure: CORONARY ARTERY BYPASS GRAFTING (CABG) X  4  USING LEFT INTERNAL MAMMARY ARTERY AND RIGHT GREATER SAPHENOUS VEIN ENDOSCOPIC CONDUITS;  Surgeon: [name], MD;  Location: [place];  Service: Open Heart Surgery;  Laterality: N/A;   DIAGNOSTIC LAPAROSCOPY     EYE SURGERY     IR CT HEAD LTD  04/19/2021   IR PERCUTANEOUS ART THROMBECTOMY/INFUSION INTRACRANIAL INC DIAG ANGIO  04/19/2021       IR PERCUTANEOUS ART THROMBECTOMY/INFUSION INTRACRANIAL INC DIAG ANGIO  04/19/2021   IR US GUIDE VASC ACCESS LEFT  04/19/2021   RADIOLOGY WITH ANESTHESIA N/A 04/19/2021   Procedure: IR WITH ANESTHESIA;  Surgeon: Luanne Bras, MD;  Location: [place];  Service: Radiology;  Laterality: N/A;   STERNAL WOUND DEBRIDEMENT N/A 08/24/2021   Procedure: STERNAL WOUND DRAINAGE AND DEBRIDEMENT;  Surgeon: Gaye Pollack, MD;  Location: [facility] OR;  Service: Thoracic;  Laterality: N/A;   STERNAL WOUND DEBRIDEMENT N/A 08/28/2021   Procedure: STERNAL WOUND DEBRIDEMENT;  Surgeon: [name], MD;  Location: [place];  Service: Thoracic;  Laterality: N/A;   TEE WITHOUT CARDIOVERSION N/A 04/24/2021   Procedure: TRANSESOPHAGEAL ECHOCARDIOGRAM (TEE);  Surgeon: Sueanne Margarita, MD;  Location: [facility] ENDOSCOPY;  Service: Cardiovascular;  Laterality: N/A;   TEE WITHOUT CARDIOVERSION N/A 08/04/2021   Procedure: TRANSESOPHAGEAL ECHOCARDIOGRAM (TEE);  Surgeon: [name], MD;  Location: [place];  Service: Open  Heart Surgery;  Laterality: N/A;   TOTAL HIP ARTHROPLASTY Right 04/04/2018   Procedure: TOTAL HIP ARTHROPLASTY;  Surgeon: Dereck Leep, MD;  Location: [facility];  Service: Orthopedics;  Laterality: Right;   Patient Active Problem List   Diagnosis Date Noted   Congestion of nasal sinus 12/04/2022   COVID 12/04/2022   Excessive cerumen in both ear canals 10/16/2022   Hyperlipidemia associated with type 2 diabetes mellitus ([place]) 10/16/2022   Hypertension associated with diabetes ([place]) 10/16/2022   Type 2 diabetes mellitus with stage 2 chronic kidney disease, without long-term current use of insulin ([place]) 10/16/2022   Colon cancer screening    Subcutaneous nodule    Diabetic peripheral neuropathy ([place])    Debility 08/11/2021   S/P CABG x 4 08/04/2021   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   Hyperkalemia    Chronic bilateral low back pain without sciatica    Leukocytosis    Hyponatremia    Slow transit constipation    Hemiparesis affecting left side as late effect of stroke ([place])    History of CVA with residual deficit 04/19/2021   Dyslipidemia, goal LDL below 70 04/18/2021   Recurrent major depressive disorder, in partial remission ([place]) 09/20/2020   Status post total replacement of hip 04/04/2018   Primary osteoarthritis of right hip 02/20/2018   Allergic rhinitis 10/28/2015   Acute stress disorder 08/01/2015   Anxiety 08/01/2015   Clinical depression 08/01/2015   Diabetes mellitus with coincident hypertension ([place]) 08/01/2015   Diverticulosis of colon 08/01/2015   Generalized pruritus 08/01/2015   Cannot sleep 08/01/2015   Adiposity 08/01/2015   Detrusor muscle hypertonia 08/01/2015   Hypercholesterolemia without hypertriglyceridemia 08/01/2015   Female stress incontinence 08/01/2015   Avitaminosis D 08/01/2015    ONSET DATE: 04/18/2021  REFERRING DIAG: hemiparesis affecting L side as late effect of CVA; dsymetria  THERAPY DIAG:  No diagnosis found.  Rationale  for Evaluation and Treatment Rehabilitation  PERTINENT HISTORY: Pt s/p R CVA on 04/18/21. Pt was initially recovering very well from a stroke standpoint with mild LUE >LLE numbness and ambulating with a cane. 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 Therapy eval's recommended CIR for decreased functional mobility - d/c to CIR on 8/29. She required incision debridement of sternal wound and application of VAC eventually discontinued on 9/29. She was discharged home on 9/30 (after a 32 day stay).  PRECAUTIONS: Fall  SUBJECTIVE: Pt reports that her copay is becoming a challenge for therapy, so she'd like to take 6 weeks off and return in March.  Schedule updated per pt request.    PAIN:  Are you having pain? 0 pain; 0 pain in the hand, 0 triggering   OBJECTIVE:    [facility] OT Assessment - 07/01/22 0001  07/01/22 08/18/22 08/27/22 11/12/22              Observation/Other Assessments        Focus on Therapeutic Outcomes (FOTO)  61  59 NT 54 (score did not convey improvement, but pt acknowledges using LUE better and more consistently with ADLs) 62             Coordination        Gross Motor Movements are Fluid and Coordinated No  No (improving) No (improving) No (improving)     Fine Motor Movements are Fluid and Coordinated No  No (improving) No (improving) No (not improved from last assessment); pt has had several missed appointments since 08/27/22 d/t grandson being in the hospital.     Right 9 Hole Peg Test 31 sec  NT NT NT     Left 9 Hole Peg Test 3 min 44 sec   improved from 5 min 38 sec 2 min 50 sec (improved from 3 min 44 sec) 2 min 9 sec  2 min 16 sec              Hand Function        Right Hand Grip (lbs) 35  NT NT      Right Hand Lateral Pinch 10 lbs  NT NT      Right Hand 3 Point Pinch 7 lbs  NT NT      Left Hand Grip (lbs) 10    decline (L LF pain from triggering limiting grip strength) 23# 25# 29#     Left Hand Lateral Pinch 6 lbs  8# 9# 10#     Left 3 point pinch 5 lbs   finger slipping 8# 6# (finger slipping) 8# (finger slipping)          TODAY'S TREATMENT:    Neuro re-ed: Facilitated L GMC/FMC skills working with to place ball pegs on pegboard which was positioned on an incline.  Pt practiced sustaining a 3 point pinch pattern with each peg placement.  Facilitated diagonal reaching patterns when removing pegs from board with reposition of pegboard on table top.  Pt required intermittent rest breaks, specifically when reaching to top of pegboard with a more extended reach, and required non-skid surface for peg pick up from table top.  Further facilitated FMC/GMC skills with white board propped vertically/angled on table top; pt practiced marking a target, connecting dots, and forming "x's" and circles with thick white board marker without arm resting on table top.    PATIENT EDUCATION:  Education details: L FMC/GMC activities  Person educated: Patient Education method: Merchandiser, retail cues Education comprehension: verbalized understanding, returned demonstration, and needs further education   HOME EXERCISE PROGRAM FMC/GMC activities for LUE    OT Short Term Goals -        OT SHORT TERM GOAL #1   Title Pt will be indep to perform LUE HEP for coordination and strengthening.    Baseline Eval: not yet initiated; 06/30/22: pt remains consistent with HEP and avoiding repetitive gripping that causes pain in L LF; 08/18/22: pt remains consistent to engage LUE; HEP ongoing as function improves; 08/27/22: same as 08/18/22; 11/12/22: indep; pt regularly engages L hand into daily tasks and knows to maintain low reps with putty to prevent triggering of L LF PIP joint.    Time 6    Period Weeks    Status achieved   Target Date 09/29/22     OT SHORT TERM GOAL #2  Title Pt to be indep with joint protection and pain  management strategies to limit triggering of L hand 3rd digit PIP.    Baseline Eval: Trigger finger splint recommended, initiated joint protection educ; 06/30/22: pt is consistent to wear her trigger finger splint and avoids repetitive gripping which causes pain to L LF PIP joint, though pt is still experiencing moderate pain in this joint with occasional triggering.  Pt plans to follow up with MD next week; 08/18/22: pt is indep with joint protection, though she states triggering has worsened since her cortisone injection, yet her pain is better.  Possibility that pt is using her hand more d/t less pain and OT reminded pt to limit repetition with gripping; 08/27/22: same as 08/18/22; 11/12/22: pt is indep with joint protection and states L LF has not triggered lately   Time 6    Period Weeks    Status achieved   Target Date 09/29/22              OT Long Term Goals -  02/04/23      OT LONG TERM GOAL #1   Title Pt will increase FOTO by 1-2 points to indicate improvement in perceived functional indep with daily tasks.    Baseline Eval: FOTO 60; 06/30/22: 61; 08/18/22: 59; 08/27/22: NT this date; 11/12/22: 54   Time 12    Period Weeks    Status ongoing   Target Date 02/04/23     OT LONG TERM GOAL #2   Title Pt will demonstrate increased L FMC/GMC to enable indep with putting hair in ponytail.    Baseline Eval: unable; 06/30/22: pt can hold her hair in a ponytail with BUEs but can not yet manage the rubber band; 08/18/22: pt can tie hair in ponytail   Time 12    Period Weeks    Status Achieved    Target Date 08/10/22      OT LONG TERM GOAL #3   Title Pt will increase L FMC to enable indep with tying shoe laces.    Baseline Eval: unable to tie laces; wears slip on shoes; 06/30/22: pt still wearing slip on shoes; 08/18/22: pt stated she tied her laces the other day, but it took her 4 trials; 08/27/22: same as 08/18/22; 11/12/22: requires multiple trials    Time 12    Period Weeks    Status ongoing    Target Date 02/04/23     OT LONG TERM GOAL #4   Title Pt will increase L hand dexterity skills to enable pt to pick up coins from table with L hand.    Baseline Eval: unable; 06/30/22: pt can pick up coins from table with extra time.  Dexterity skills improving as noted by improved 9 hole peg test from 5 min 38 sec to 3 min 44 sec on the L; 08/18/22: Pt picks up coins from table with mild-moderate difficulty, L 9 hole 2 min 50 sec; 08/27/22: L 9 hole 2 min 9 secs; 11/12/22: 2 min 16 sec (mild-mod difficulty with picking up coins from a table)   Time 12    Period Weeks    Status ongoing   Target Date 02/04/23     OT LONG TERM GOAL #5   Title Pt will increase L grip strength by 5 or more lbs to improve ability to hold and carry ADL supplies in L hand.    Baseline Eval: L grip 17# (R 35#); 06/30/22: L grip 10# (pain in L LF significantly  limiting grip as finger continues to trigger; 08/18/22: L grip 23#; 08/27/22: L grip 25#; 02/04/23: L grip 29#   Time 12    Period Weeks    Status ongoing   Target Date 02/04/23            Plan - 06/02/22 1036     Clinical Impression Statement Pt able to complete ball peg activity this date with fewer dropped pegs and improved ability to maintain a 3 point pinch pattern when grasping the pegs.  Pt reports that she continues to try and engage the LUE at home as often as she can, and feels like she's using the arm a little better.  Pt reports that her copay is becoming a challenge for therapy, so she'd like to take 6 weeks off and return in March for a re-eval.  Schedule updated per pt request.  Pt continues to present with moderate ataxia throughout the LUE.  Pt will continue to benefit from skilled OT for increasing LUE strength and maximizing coordination skills in order to increase efficiency when engaging LUE into daily tasks.      OT Occupational Profile and History Problem Focused Assessment - Including review of records relating to presenting problem     Occupational performance deficits (Please refer to evaluation for details): ADL's;IADL's;Work    Body Structure / Function / Physical Skills ADL;GMC;UE functional use;FMC;Decreased knowledge of use of DME;Pain;Strength;Coordination;Dexterity;IADL;Sensation;Balance    Rehab Potential Good    Clinical Decision Making Limited treatment options, no task modification necessary    Comorbidities Affecting Occupational Performance: May have comorbidities impacting occupational performance    Modification or Assistance to Complete Evaluation  No modification of tasks or assist necessary to complete eval    OT Frequency 1-2x / week    OT Duration 12 weeks    OT Treatment/Interventions Self-care/ADL training;Therapeutic exercise;Therapeutic activities;Cryotherapy;Moist Heat;Neuromuscular education;DME and/or AE instruction;Manual Therapy;Splinting;Patient/family education    Plan OT focus on LUE coordination/strength training and education/splinting for L hand 3rd digit PIP triggering    OT Home Exercise Plan L GMC/FMC tasks, strengthening as tolerated   Consulted and Agree with Plan of Care Patient            Leta Speller, MS, OTR/L   Darleene Cleaver, OT 03/05/2023, 10:12 AM

## 2023-03-09 ENCOUNTER — Ambulatory Visit: Payer: Self-pay

## 2023-03-09 ENCOUNTER — Ambulatory Visit: Payer: PPO

## 2023-03-11 ENCOUNTER — Ambulatory Visit: Payer: Self-pay

## 2023-03-11 ENCOUNTER — Other Ambulatory Visit: Payer: Self-pay

## 2023-03-11 ENCOUNTER — Ambulatory Visit: Payer: PPO

## 2023-03-11 DIAGNOSIS — H40153 Residual stage of open-angle glaucoma, bilateral: Secondary | ICD-10-CM | POA: Diagnosis not present

## 2023-03-11 MED ORDER — LATANOPROST 0.005 % OP SOLN
1.0000 [drp] | Freq: Every evening | OPHTHALMIC | 6 refills | Status: DC
  Filled 2023-03-11: qty 2.5, 50d supply, fill #0
  Filled 2023-08-25: qty 2.5, 50d supply, fill #1
  Filled 2024-03-06: qty 2.5, 50d supply, fill #2

## 2023-03-12 ENCOUNTER — Ambulatory Visit: Payer: PPO

## 2023-03-12 ENCOUNTER — Other Ambulatory Visit: Payer: Self-pay

## 2023-03-15 ENCOUNTER — Other Ambulatory Visit: Payer: Self-pay

## 2023-03-16 ENCOUNTER — Ambulatory Visit: Payer: Self-pay

## 2023-03-16 ENCOUNTER — Ambulatory Visit: Payer: PPO

## 2023-03-16 ENCOUNTER — Other Ambulatory Visit: Payer: Self-pay

## 2023-03-17 ENCOUNTER — Telehealth: Payer: Self-pay | Admitting: Family Medicine

## 2023-03-17 NOTE — Telephone Encounter (Signed)
Contacted [name] to schedule their annual wellness visit. Patient declined to schedule AWV at this time. Will call back to schedule AWV-I with [facility].   [place] Direct Dial: [phone]

## 2023-03-18 ENCOUNTER — Ambulatory Visit: Payer: Self-pay

## 2023-03-18 ENCOUNTER — Ambulatory Visit: Payer: PPO

## 2023-03-19 ENCOUNTER — Ambulatory Visit: Payer: PPO | Attending: Adult Health

## 2023-03-19 DIAGNOSIS — I6381 Other cerebral infarction due to occlusion or stenosis of small artery: Secondary | ICD-10-CM | POA: Insufficient documentation

## 2023-03-19 DIAGNOSIS — R278 Other lack of coordination: Secondary | ICD-10-CM | POA: Insufficient documentation

## 2023-03-19 DIAGNOSIS — M6281 Muscle weakness (generalized): Secondary | ICD-10-CM | POA: Diagnosis not present

## 2023-03-21 NOTE — Therapy (Signed)
OUTPATIENT OCCUPATIONAL THERAPY NEURO TREATMENT NOTE    Patient Name: EZME GLANCY MRN: [id number] DOB:1956/10/02, 67 y.o., female Today's Date: 03/21/2023  PCP: Dr. Malva Limes REFERRING PROVIDER: Ihor Austin, NP   OT End of Session - 03/21/23 1616     Visit Number 2    Number of Visits 12    Date for OT Re-Evaluation 05/28/23    Authorization Time Period Reporting period beginning 03/05/23    Progress Note Due on Visit 10    OT Start Time 1015    OT Stop Time 1100    OT Time Calculation (min) 45 min    Equipment Utilized During Treatment transport chair    Activity Tolerance Patient tolerated treatment well    Behavior During Therapy [facility] for tasks assessed/performed               Past Medical History:  Diagnosis Date   Anxiety    Arthritis    CAD (coronary artery disease)    a. 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 s/p CABG x4 LIMA to LAD, SVG to PDA, and Sequential SVG to OM and Diagonal on 8/   Stroke (HCC) 04/2021   admitted at [facility] - left sided weakness   Urinary incontinence    as stated by patient   Past Surgical History:  Procedure Laterality Date   AORTIC VALVE REPLACEMENT N/A 08/04/2021   Procedure: RESECTION AORTIC VALVE TUMOR;  Surgeon: Loreli Slot, MD;  Location: [facility] OR;  Service: Open Heart Surgery;  Laterality: N/A;   APPLICATION OF WOUND VAC N/A 08/24/2021   Procedure: APPLICATION OF WOUND VAC;   Surgeon: Alleen Borne, MD;  Location: [facility] OR;  Service: Thoracic;  Laterality: N/A;   APPLICATION OF WOUND VAC N/A 08/28/2021   Procedure: WOUND VAC CHANGE;  Surgeon: Loreli Slot, MD;  Location: [facility] OR;  Service: Thoracic;  Laterality: N/A;   BREAST EXCISIONAL BIOPSY Right 1999   neg   BUBBLE STUDY  04/24/2021   Procedure: BUBBLE STUDY;  Surgeon: Quintella Reichert, MD;  Location: [facility] ENDOSCOPY;  Service: Cardiovascular;;   CATARACT EXTRACTION  12/2011   CATARACT EXTRACTION W/PHACO Left 12/02/2016   Procedure: CATARACT EXTRACTION PHACO AND INTRAOCULAR LENS PLACEMENT (IOC);  Surgeon: Sallee Lange, MD;  Location: [facility] ORS;  Service: Ophthalmology;  Laterality: Left;  [place] 2:14 AP% 28.4 CDE 59.73 Fluid pack lot # [id number]   COLONOSCOPY WITH PROPOFOL N/A 04/13/2022   Procedure: COLONOSCOPY WITH PROPOFOL;  Surgeon: Toney Reil, MD;  Location: [facility] ENDOSCOPY;  Service: Gastroenterology;  Laterality: N/A;   CORONARY ARTERY BYPASS GRAFT N/A 08/04/2021   Procedure: CORONARY ARTERY BYPASS GRAFTING (CABG) X  4  USING LEFT INTERNAL MAMMARY ARTERY AND RIGHT GREATER SAPHENOUS VEIN ENDOSCOPIC CONDUITS;  Surgeon: Loreli Slot, MD;  Location: [facility] OR;  Service: Open Heart Surgery;  Laterality: N/A;   DIAGNOSTIC LAPAROSCOPY     EYE SURGERY     IR CT HEAD LTD  04/19/2021   IR PERCUTANEOUS ART THROMBECTOMY/INFUSION INTRACRANIAL INC DIAG ANGIO  04/19/2021       IR PERCUTANEOUS ART THROMBECTOMY/INFUSION INTRACRANIAL INC DIAG ANGIO  04/19/2021   IR US GUIDE VASC ACCESS LEFT  04/19/2021   RADIOLOGY WITH ANESTHESIA N/A 04/19/2021   Procedure: IR WITH ANESTHESIA;  Surgeon: Julieanne Cotton, MD;  Location: [facility] OR;  Service: Radiology;  Laterality: N/A;   STERNAL WOUND DEBRIDEMENT N/A 08/24/2021   Procedure: STERNAL WOUND DRAINAGE AND DEBRIDEMENT;  Surgeon: Alleen Borne, MD;  Location: [facility] OR;  Service: Thoracic;  Laterality: N/A;   STERNAL WOUND DEBRIDEMENT N/A 08/28/2021   Procedure:  STERNAL WOUND DEBRIDEMENT;  Surgeon: Loreli Slot, MD;  Location: [facility] OR;  Service: Thoracic;  Laterality: N/A;   TEE WITHOUT CARDIOVERSION N/A 04/24/2021   Procedure: TRANSESOPHAGEAL ECHOCARDIOGRAM (TEE);  Surgeon: Quintella Reichert, MD;  Location: [facility] ENDOSCOPY;  Service: Cardiovascular;  Laterality: N/A;   TEE WITHOUT CARDIOVERSION N/A 08/04/2021   Procedure: TRANSESOPHAGEAL ECHOCARDIOGRAM (TEE);  Surgeon: Loreli Slot, MD;  Location: [facility] OR;  Service: Open Heart Surgery;  Laterality: N/A;   TOTAL HIP ARTHROPLASTY Right 04/04/2018   Procedure: TOTAL HIP ARTHROPLASTY;  Surgeon: Donato Heinz, MD;  Location: [facility] ORS;  Service: Orthopedics;  Laterality: Right;   Patient Active Problem List   Diagnosis Date Noted   Congestion of nasal sinus 12/04/2022   COVID 12/04/2022   Excessive cerumen in both ear canals 10/16/2022   Hyperlipidemia associated with type 2 diabetes mellitus 10/16/2022   Hypertension associated with diabetes 10/16/2022   Type 2 diabetes mellitus with stage 2 chronic kidney disease, without long-term current use of insulin 10/16/2022   Colon cancer screening    Subcutaneous nodule    Diabetic peripheral neuropathy    Debility 08/11/2021   S/P CABG x 4 08/04/2021   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   Hyperkalemia    Chronic bilateral low back pain without sciatica    Leukocytosis    Hyponatremia    Slow transit constipation    Hemiparesis affecting left side as late effect of stroke    History of CVA with residual deficit 04/19/2021   Dyslipidemia, goal LDL below 70 04/18/2021   Recurrent major depressive disorder, in partial remission 09/20/2020   Status post total replacement of hip 04/04/2018   Primary osteoarthritis of right hip 02/20/2018   Allergic rhinitis 10/28/2015   Acute stress disorder 08/01/2015   Anxiety 08/01/2015   Clinical depression 08/01/2015   Diabetes mellitus with coincident hypertension 08/01/2015   Diverticulosis  of colon 08/01/2015   Generalized pruritus 08/01/2015   Cannot sleep 08/01/2015   Adiposity 08/01/2015   Detrusor muscle hypertonia 08/01/2015   Hypercholesterolemia without hypertriglyceridemia 08/01/2015   Female stress incontinence 08/01/2015   Avitaminosis D 08/01/2015   ONSET DATE: 04/18/2021  REFERRING DIAG: hemiparesis affecting L non-dominant side as late effect of CVA; dsymetria  THERAPY DIAG:  Muscle weakness (generalized)  Other lack of coordination  Right thalamic infarction  Rationale for Evaluation and Treatment Rehabilitation  PERTINENT HISTORY: Pt presents today for re-evaluation after pt requested therapy to hold after her OT visit on 01/06/23 d/t financial hardship with her copay.  Pt has now started working part-time and wishes to continue to work towards increasing use and efficiency of the LUE with ADL and work tasks.  Pt s/p R CVA  on 04/18/21. Pt was initially recovering very well from a stroke standpoint with mild LUE >LLE numbness and ambulating with a cane. 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 Therapy eval's recommended CIR for decreased functional mobility - d/c to CIR on 8/29. She required incision debridement of sternal wound and application of VAC eventually discontinued on 9/29. She was discharged home on 9/30 (after a 32 day stay).  PRECAUTIONS: Fall  SUBJECTIVE: Pt reports doing well today and continues to want to practice handling money with both hands to reduce dropping money at work, as working the [employer] is her main job function.  PAIN:  Are you having pain? 0 pain; 0 pain in the hand, 0 triggering   OBJECTIVE:    [facility] OT Assessment - 07/01/22 0001       07/01/22 08/18/22 08/27/22 11/12/22 Re-eval 03/05/23             Observation/Other Assessments        Focus on Therapeutic  Outcomes (FOTO)  61  59 NT 54 (score did not convey improvement, but pt acknowledges using LUE better and more consistently with ADLs) 62             Coordination        Gross Motor Movements are Fluid and Coordinated No  No (improving) No (improving) No (improving) LUE Moderately- severely ataxic    Fine Motor Movements are Fluid and Coordinated No  No (improving) No (improving) No (not improved from last assessment); pt has had several missed appointments since 08/27/22 d/t grandson being in the hospital. LUE Moderately- severely ataxic    Right 9 Hole Peg Test 31 sec  NT NT NT 34 sec (WNL)    Left 9 Hole Peg Test 3 min 44 sec   improved from 5 min 38 sec 2 min 50 sec (improved from 3 min 44 sec) 2 min 9 sec  2 min 16 sec 2 min 2 sec             Hand Function        Right Hand Grip (lbs) 35  NT NT  40#    Right Hand Lateral Pinch 10 lbs  NT NT  9#    Right Hand 3 Point Pinch 7 lbs  NT NT  7#    Left Hand Grip (lbs) 10   decline (L LF pain from triggering limiting grip strength) 23# 25# 29# 28#    Left Hand Lateral Pinch 6 lbs  8# 9# 10# 5#  (Saehan gauge)    Left 3 point pinch 5 lbs   finger slipping 8# 6# (finger slipping) 8# (finger slipping) 5# (Saehan gauge)         TODAY'S TREATMENT:  Self Care: Participated in L hand and bilat hand coin and bill manipulation activities with coin pick up from table, storage of coins within palm of hand, transferring coins from palm of hand to fingertips to enable discarding coins one at a time without dropping into resistive slotted bank.  Included 2 handed tasks sorting bills into piles, reaching L hand to receive and hand out bills and coins to a "customer" to simulate work related tasks; practiced with and without L forearm supported on table top.  Practiced moving bills and coins between L and R hands without dropping.    PATIENT EDUCATION:  Education  details: coin/bill manipulation skills Person educated: Patient Education method: Explanation  and Verbal cues Education comprehension: verbalized understanding  HOME EXERCISE PROGRAM FMC/GMC activities for LUE    OT Short Term Goals -  04/16/23 (6 weeks)      OT SHORT TERM GOAL #1   Title Pt will be indep to perform LUE HEP for coordination and strengthening.    Baseline Eval: not yet initiated; 06/30/22: pt remains consistent with HEP and avoiding repetitive gripping that causes pain in L LF; 08/18/22: pt remains consistent to engage LUE; HEP ongoing as function improves; 08/27/22: same as 08/18/22; 11/12/22: indep; pt regularly engages L hand into daily tasks and knows to maintain low reps with putty to prevent triggering of L LF PIP joint.   Re-eval 03/05/23: no current consistent HEP   Time 6    Period Weeks    Status Previously achieved; currently ongoing   Target Date 04/16/23      OT SHORT TERM GOAL #2   Title Pt to be indep with joint protection and pain management strategies to limit triggering of L hand 3rd digit PIP.    Baseline Eval: Trigger finger splint recommended, initiated joint protection educ; 06/30/22: pt is consistent to wear her trigger finger splint and avoids repetitive gripping which causes pain to L LF PIP joint, though pt is still experiencing moderate pain in this joint with occasional triggering.  Pt plans to follow up with MD next week; 08/18/22: pt is indep with joint protection, though she states triggering has worsened since her cortisone injection, yet her pain is better.  Possibility that pt is using her hand more d/t less pain and OT reminded pt to limit repetition with gripping; 08/27/22: same as 08/18/22; 11/12/22: pt is indep with joint protection and states L LF has not triggered lately  Re-eval 03/05/23: Pt reports L LF no longer triggers   Time 6    Period Weeks    Status achieved   Target Date 09/29/22              OT Long Term Goals -  05/28/23 (12 weeks)      OT LONG TERM GOAL #1   Title Pt will increase FOTO by 1-2 points to indicate  improvement in perceived functional indep with daily tasks.    Baseline Eval: FOTO 60; 06/30/22: 61; 08/18/22: 59; 08/27/22: NT this date; 11/12/22: 54  Re-eval 03/05/23: 62   Time 12    Period Weeks    Status ongoing   Target Date 05/28/23     OT LONG TERM GOAL #2   Title Pt will demonstrate increased L FMC/GMC to enable indep with putting hair in ponytail.   Baseline Eval: unable; 06/30/22: pt can hold her hair in a ponytail with BUEs but can not yet manage the rubber band; 08/18/22: pt can tie hair in ponytail   Time 12    Period Weeks    Status Achieved    Target Date 08/10/22      OT LONG TERM GOAL #3   Title Pt will increase L FMC to enable indep with tying shoe laces.    Baseline Eval: unable to tie laces; wears slip on shoes; 06/30/22: pt still wearing slip on shoes; 08/18/22: pt stated she tied her laces the other day, but it took her 4 trials; 08/27/22: same as 08/18/22; 11/12/22: requires multiple trials   Re-eval 03/05/23: d/c goal as pt prefers slip on shoes   Time 12    Period  Weeks    Status D/c   Target Date 03/05/23     OT LONG TERM GOAL #4   Title Pt will increase L hand dexterity skills to enable pt to pick up coins from table with L hand.  Revised at re-eval on 03/05/23: Pt will increase L hand GMC and dexterity skills to receive and issue change in bills/coins and manipulate money between R/L hands for sorting/counting without dropping money 75% of the time to improve efficiency at work.   Baseline Eval: unable; 06/30/22: pt can pick up coins from table with extra time.  Dexterity skills improving as noted by improved 9 hole peg test from 5 min 38 sec to 3 min 44 sec on the L; 08/18/22: Pt picks up coins from table with mild-moderate difficulty, L 9 hole 2 min 50 sec; 08/27/22: L 9 hole 2 min 9 secs; 11/12/22: 2 min 16 sec (mild-mod difficulty with picking up coins from a table)  Re-eval 03/05/23: Pt sorts bills/coins with bilat hands with extra time and frequent dropping of coins.  Pt  receives and issues change to customers using R hand.    Time 12    Period Weeks    Status ongoing   Target Date 05/28/23     OT LONG TERM GOAL #5   Title Pt will increase L grip strength by 5 or more lbs to improve ability to hold and carry ADL supplies in L hand.    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  Pt continues to present with LUE weakness and moderate to severe dysmetria.  Pt states that she constantly moves money from L hand to the R to prepare for handing back and receiving money from customers as she is not confident using the L hand without dropping bills and coins.  Pt continues to struggle with goal directed reaching in the LUE and translatory skills in the L hand.  Pt will continue to benefit from skilled OT for increasing LUE strength and maximizing coordination skills in order to increase efficiency for ADL and work tasks.    OT Occupational Profile and History Problem Focused Assessment - Including review of records relating to presenting problem    Occupational performance deficits (Please refer to evaluation for details): ADL's;IADL's;Work    Body Structure / Function / Physical Skills ADL;GMC;UE functional use;FMC;Decreased knowledge of use of DME;Pain;Strength;Coordination;Dexterity;IADL;Sensation;Balance    Rehab Potential Good    Clinical Decision Making Limited treatment options, no task modification necessary     Comorbidities Affecting Occupational Performance: May have comorbidities impacting occupational performance    Modification or Assistance to Complete Evaluation  No modification of tasks or assist necessary to complete eval    OT Frequency 1x/ week    OT Duration 12 weeks    OT Treatment/Interventions Self-care/ADL training;Therapeutic exercise;Therapeutic  activities;Cryotherapy;Moist Heat;Neuromuscular education;DME and/or AE instruction;Manual Therapy;Splinting;Patient/family education    Plan OT focus on LUE coordination/strength training and education/splinting for L hand 3rd digit PIP triggering    OT Home Exercise Plan L GMC/FMC tasks, strengthening as tolerated   Consulted and Agree with Plan of Care Patient            Danelle EarthlyNancy Oletha Tolson, MS, OTR/L   Otis DialsNancy K Kayn Haymore, OT 03/21/2023, 4:17 PM

## 2023-03-22 ENCOUNTER — Telehealth: Payer: Self-pay | Admitting: Family Medicine

## 2023-03-22 ENCOUNTER — Other Ambulatory Visit: Payer: Self-pay

## 2023-03-22 MED ORDER — ONETOUCH DELICA PLUS LANCET33G MISC
1.0000 | Freq: Three times a day (TID) | 0 refills | Status: AC
  Filled 2023-03-22: qty 100, 34d supply, fill #0
  Filled 2023-03-26: qty 100, 30d supply, fill #0

## 2023-03-22 MED ORDER — BLOOD GLUCOSE MONITOR SYSTEM W/DEVICE KIT
1.0000 | PACK | Freq: Three times a day (TID) | 0 refills | Status: DC
  Filled 2023-03-22: qty 1, 1d supply, fill #0

## 2023-03-22 MED ORDER — ONETOUCH ULTRA VI STRP
1.0000 | ORAL_STRIP | Freq: Three times a day (TID) | 0 refills | Status: AC
  Filled 2023-03-22 – 2023-03-26 (×2): qty 100, 34d supply, fill #0

## 2023-03-22 MED ORDER — LANCET DEVICE MISC
1.0000 | Freq: Three times a day (TID) | 0 refills | Status: AC
  Filled 2023-03-22 – 2024-03-06 (×2): qty 1, 30d supply, fill #0

## 2023-03-22 NOTE — Telephone Encounter (Signed)
Contacted Orlando Penner to schedule their annual wellness visit. Appointment made for 04/25/2023.  [facility] Care Guide [facility] AWV TEAM Direct Dial: [phone]

## 2023-03-22 NOTE — Telephone Encounter (Signed)
Pt is calling to report that she is in need of a glucose meter. Pharmacy is requesting the nurse to contact them to find out what is inexpensive at no cost. Pharmacy- [facility]. CB- Pt [phone]

## 2023-03-23 ENCOUNTER — Other Ambulatory Visit: Payer: Self-pay

## 2023-03-23 ENCOUNTER — Ambulatory Visit: Payer: PPO

## 2023-03-23 ENCOUNTER — Ambulatory Visit: Payer: Self-pay

## 2023-03-24 ENCOUNTER — Other Ambulatory Visit: Payer: Self-pay

## 2023-03-24 ENCOUNTER — Ambulatory Visit: Payer: PPO | Admitting: Urology

## 2023-03-24 DIAGNOSIS — N3000 Acute cystitis without hematuria: Secondary | ICD-10-CM

## 2023-03-24 DIAGNOSIS — R3129 Other microscopic hematuria: Secondary | ICD-10-CM

## 2023-03-24 DIAGNOSIS — N3941 Urge incontinence: Secondary | ICD-10-CM

## 2023-03-25 ENCOUNTER — Ambulatory Visit: Payer: PPO

## 2023-03-25 ENCOUNTER — Ambulatory Visit: Payer: Self-pay

## 2023-03-26 ENCOUNTER — Other Ambulatory Visit (HOSPITAL_COMMUNITY): Payer: Self-pay

## 2023-03-26 ENCOUNTER — Other Ambulatory Visit: Payer: Self-pay

## 2023-03-26 ENCOUNTER — Ambulatory Visit: Payer: PPO

## 2023-03-30 ENCOUNTER — Ambulatory Visit: Payer: PPO

## 2023-03-30 ENCOUNTER — Ambulatory Visit: Payer: Self-pay

## 2023-04-01 ENCOUNTER — Ambulatory Visit: Payer: PPO

## 2023-04-01 ENCOUNTER — Ambulatory Visit: Payer: Self-pay

## 2023-04-06 ENCOUNTER — Ambulatory Visit: Payer: Self-pay

## 2023-04-06 ENCOUNTER — Ambulatory Visit: Payer: PPO

## 2023-04-08 ENCOUNTER — Ambulatory Visit: Payer: PPO

## 2023-04-08 ENCOUNTER — Ambulatory Visit: Payer: Self-pay

## 2023-04-16 ENCOUNTER — Ambulatory Visit: Payer: PPO

## 2023-04-23 ENCOUNTER — Ambulatory Visit: Payer: PPO

## 2023-04-27 ENCOUNTER — Telehealth: Payer: Self-pay

## 2023-04-27 NOTE — Telephone Encounter (Signed)
Left message to notify pt that therapy visits have been cancelled as she has had multiple cancellations making her no longer in compliance with our attendance policy. Notified pt that a new MD order for tx will be needed should pt want to resume services.   [name], MS, OTR/L

## 2023-04-28 ENCOUNTER — Ambulatory Visit (INDEPENDENT_AMBULATORY_CARE_PROVIDER_SITE_OTHER): Payer: PPO

## 2023-04-28 VITALS — Ht 67.0 in | Wt 178.0 lb

## 2023-04-28 DIAGNOSIS — Z0000 Encounter for general adult medical examination without abnormal findings: Secondary | ICD-10-CM | POA: Diagnosis not present

## 2023-04-28 DIAGNOSIS — Z1231 Encounter for screening mammogram for malignant neoplasm of breast: Secondary | ICD-10-CM

## 2023-04-28 DIAGNOSIS — K089 Disorder of teeth and supporting structures, unspecified: Secondary | ICD-10-CM

## 2023-04-28 DIAGNOSIS — Z9189 Other specified personal risk factors, not elsewhere classified: Secondary | ICD-10-CM

## 2023-04-28 NOTE — Progress Notes (Signed)
I connected with  Orlando Penner on 04/28/23 by a audio enabled telemedicine application and verified that I am speaking with the correct person using two identifiers.  Patient Location: Home  Provider Location: Office/Clinic  I discussed the limitations of evaluation and management by telemedicine. The patient expressed understanding and agreed to proceed.  Subjective:   [name] is a 67 y.o. female who presents for an Initial Medicare Annual Wellness Visit.  Review of Systems     Cardiac Risk Factors include: advanced age (>44men, >21 women);diabetes mellitus;dyslipidemia;hypertension;sedentary lifestyle     Objective:    Today's Vitals   04/28/23 0816  Weight: 178 lb (80.7 kg)  Height: 5\' 7"  (1.702 m)   Body mass index is 27.88 kg/m.     04/28/2023    8:29 AM 05/19/2022    9:24 AM 04/13/2022    8:20 AM 08/24/2021   11:38 AM 08/08/2021    8:24 PM 07/31/2021    2:27 PM 07/15/2021    2:41 PM  Advanced Directives  Does Patient Have a Medical Advance Directive? No No No No No No No  Would patient like information on creating a medical advance directive?  No - Patient declined No - Patient declined No - Patient declined No - Patient declined  No - Patient declined    Current Medications (verified) Outpatient Encounter Medications as of 04/28/2023  Medication Sig   Blood Glucose Monitoring Suppl (BLOOD GLUCOSE MONITOR SYSTEM) w/Device KIT 1 each by Does not apply route in the morning, at noon, and at bedtime.   glucose blood (ONETOUCH ULTRA) test strip Use to check blood sugar in the morning, at noon, and at bedtime.   acetaminophen (TYLENOL) 325 MG tablet Take 2 tablets (650 mg total) by mouth every 4 (four) hours as needed for mild pain (or temp > 37.5 C (99.5 F)).   ALPRAZolam (XANAX) 0.5 MG tablet Take 1 tablet (0.5 mg total) by mouth 2 (two) times daily as needed for anxiety   amLODipine (NORVASC) 10 MG tablet Take 1 tablet (10 mg total) by mouth daily.   aspirin 81 MG  EC tablet Take 1 tablet (81 mg total) by mouth daily. Swallow whole.   Blood Glucose Monitoring Suppl (FREESTYLE FREEDOM LITE) w/Device KIT USE AS DIRECTED   ciprofloxacin (CILOXAN) 0.3 % ophthalmic solution Place 1 drop in affected eye every 4 hours while awake   empagliflozin (JARDIANCE) 10 MG TABS tablet Take 1 tablet (10 mg total) by mouth daily before breakfast. (Patient not taking: Reported on 02/23/2023)   glucose blood (FREESTYLE LITE) test strip Also needs Lancets. Use to check blood sugar up to four times a day for insulin dependant diabetes   glyBURIDE (DIABETA) 5 MG tablet Take 2 tablets (10 mg total) by mouth 2 (two) times daily with a meal.   latanoprost (XALATAN) 0.005 % ophthalmic solution Place 1 drop into the left eye at bedtime.   latanoprost (XALATAN) 0.005 % ophthalmic solution Place 1 drop into both eyes Nightly.   losartan (COZAAR) 100 MG tablet Take 1 tablet (100 mg total) by mouth daily.   metFORMIN (GLUCOPHAGE-XR) 500 MG 24 hr tablet Take 2 tablets (1,000 mg total) by mouth 2 (two) times daily with a meal.   montelukast (SINGULAIR) 10 MG tablet Take 1 tablet (10 mg total) by mouth at bedtime.   Multiple Vitamin (MULTIVITAMIN) tablet Take 1 tablet by mouth daily.   oxybutynin (DITROPAN-XL) 10 MG 24 hr tablet Take 1 tablet (10 mg total) by  mouth daily.   rosuvastatin (CRESTOR) 40 MG tablet Take 1 tablet (40 mg total) by mouth daily.   sertraline (ZOLOFT) 25 MG tablet Take 1 tablet by mouth at bedtime.   Vitamin D, Ergocalciferol, (DRISDOL) 1.25 MG (50000 UNIT) CAPS capsule Take 1 capsule (50,000 Units total) by mouth every 7 (seven) days.   No facility-administered encounter medications on file as of 04/28/2023.    Allergies (verified) Jardiance [empagliflozin] and Penicillins   History: Past Medical History:  Diagnosis Date   Anxiety    Arthritis    CAD (coronary artery disease)    a. 3 vessel CAD noted on coronary CTA 04/2021, s/p CABG x4   Cataract    left    Depression    Diabetes mellitus without complication (HCC)    type 2   Environmental and seasonal allergies    GERD (gastroesophageal reflux disease)    Hyperlipidemia    Hypertension    Joint pain    as reported by patient   Papillary fibroelastoma of heart    of the aortic valve,  s/p AVR and tumor resection on 08/04/2021   Right thalamic infarction (HCC) 04/29/2021   S/P AVR (aortic valve replacement)    s/p AVR with resection of AV fibroelastoma on 08/04/2021   S/P CABG x 4    a. s/p CABG x4 LIMA to LAD, SVG to PDA, and Sequential SVG to OM and Diagonal on 8/   Stroke (HCC) 04/2021   admitted at [facility] - left sided weakness   Urinary incontinence    as stated by patient   Past Surgical History:  Procedure Laterality Date   AORTIC VALVE REPLACEMENT N/A 08/04/2021   Procedure: RESECTION AORTIC VALVE TUMOR;  Surgeon: Loreli Slot, MD;  Location: [facility] OR;  Service: Open Heart Surgery;  Laterality: N/A;   APPLICATION OF WOUND VAC N/A 08/24/2021   Procedure: APPLICATION OF WOUND VAC;  Surgeon: Alleen Borne, MD;  Location: [facility] OR;  Service: Thoracic;  Laterality: N/A;   APPLICATION OF WOUND VAC N/A 08/28/2021   Procedure: WOUND VAC CHANGE;  Surgeon: Loreli Slot, MD;  Location: [facility] OR;  Service: Thoracic;  Laterality: N/A;   BREAST EXCISIONAL BIOPSY Right 1999   neg   BUBBLE STUDY  04/24/2021   Procedure: BUBBLE STUDY;  Surgeon: Quintella Reichert, MD;  Location[facility] ENDOSCOPY;  Service: Cardiovascular;;   CATARACT EXTRACTION  12/2011   CATARACT EXTRACTION W/PHACO Left 12/02/2016   Procedure: CATARACT EXTRACTION PHACO AND INTRAOCULAR LENS PLACEMENT (IOC);  Surgeon: Sallee Lange, MD;  Location: [facility] ORS;  Service: Ophthalmology;  Laterality: Left;  [place] 2:14 AP% 28.4 CDE 59.73 Fluid pack lot # [id number]   COLONOSCOPY WITH PROPOFOL N/A 04/13/2022   Procedure: COLONOSCOPY WITH PROPOFOL;  Surgeon: Toney Reil, MD;  Location: [facility] ENDOSCOPY;  Service:  Gastroenterology;  Laterality: N/A;   CORONARY ARTERY BYPASS GRAFT N/A 08/04/2021   Procedure: CORONARY ARTERY BYPASS GRAFTING (CABG) X  4  USING LEFT INTERNAL MAMMARY ARTERY AND RIGHT GREATER SAPHENOUS VEIN ENDOSCOPIC CONDUITS;  Surgeon: Loreli Slot, MD;  Location: [facility] OR;  Service: Open Heart Surgery;  Laterality: N/A;   DIAGNOSTIC LAPAROSCOPY     EYE SURGERY     IR CT HEAD LTD  04/19/2021   IR PERCUTANEOUS ART THROMBECTOMY/INFUSION INTRACRANIAL INC DIAG ANGIO  04/19/2021       IR PERCUTANEOUS ART THROMBECTOMY/INFUSION INTRACRANIAL INC DIAG ANGIO  04/19/2021   IR US GUIDE VASC ACCESS LEFT  04/19/2021   RADIOLOGY  WITH ANESTHESIA N/A 04/19/2021   Procedure: IR WITH ANESTHESIA;  Surgeon: Julieanne Cotton, MD;  Location: [facility] OR;  Service: Radiology;  Laterality: N/A;   STERNAL WOUND DEBRIDEMENT N/A 08/24/2021   Procedure: STERNAL WOUND DRAINAGE AND DEBRIDEMENT;  Surgeon: Alleen Borne, MD;  Location: [facility] OR;  Service: Thoracic;  Laterality: N/A;   STERNAL WOUND DEBRIDEMENT N/A 08/28/2021   Procedure: STERNAL WOUND DEBRIDEMENT;  Surgeon: Loreli Slot, MD;  Location: [facility] OR;  Service: Thoracic;  Laterality: N/A;   TEE WITHOUT CARDIOVERSION N/A 04/24/2021   Procedure: TRANSESOPHAGEAL ECHOCARDIOGRAM (TEE);  Surgeon: Quintella Reichert, MD;  Location: [facility] ENDOSCOPY;  Service: Cardiovascular;  Laterality: N/A;   TEE WITHOUT CARDIOVERSION N/A 08/04/2021   Procedure: TRANSESOPHAGEAL ECHOCARDIOGRAM (TEE);  Surgeon: Loreli Slot, MD;  Location: [facility] OR;  Service: Open Heart Surgery;  Laterality: N/A;   TOTAL HIP ARTHROPLASTY Right 04/04/2018   Procedure: TOTAL HIP ARTHROPLASTY;  Surgeon: Donato Heinz, MD;  Location: [facility] ORS;  Service: Orthopedics;  Laterality: Right;   Family History  Problem Relation Age of Onset   Bone cancer Brother    Aneurysm Mother        brain   Diabetes Father    CAD Father    Heart failure Father    Colon cancer Father    Cancer Father     Alzheimer's disease Paternal Grandmother    Dementia Paternal Grandmother    Mental illness Paternal Grandfather    Healthy Daughter    Healthy Son    Breast cancer Neg Hx    Social History   Socioeconomic History   Marital status: Widowed    Spouse name: Not on file   Number of children: 2   Years of education: Not on file   Highest education level: 12th grade  Occupational History    Employer: [employer]  Tobacco Use   Smoking status: Former    Packs/day: 1.50    Years: 25.00    Additional pack years: 0.00    Total pack years: 37.50    Types: Cigarettes    Quit date: 03/24/2007    Years since quitting: 16.1   Smokeless tobacco: Never  Vaping Use   Vaping Use: Never used  Substance and Sexual Activity   Alcohol use: Not Currently   Drug use: No   Sexual activity: Not Currently    Birth control/protection: Post-menopausal  Other Topics Concern   Not on file  Social History Narrative   Not on file   Social Determinants of Health   Financial Resource Strain: Low Risk  (04/28/2023)   Overall Financial Resource Strain (CARDIA)    Difficulty of Paying Living Expenses: Not hard at all  Food Insecurity: No Food Insecurity (04/28/2023)   Hunger Vital Sign    Worried About Running Out of Food in the Last Year: Never true    Ran Out of Food in the Last Year: Never true  Transportation Needs: No Transportation Needs (04/28/2023)   PRAPARE - Administrator, Civil Service (Medical): No    Lack of Transportation (Non-Medical): No  Physical Activity: Inactive (04/28/2023)   Exercise Vital Sign    Days of Exercise per Week: 0 days    Minutes of Exercise per Session: 0 min  Stress: Stress Concern Present (04/28/2023)   [employer] of Occupational Health - Occupational Stress Questionnaire    Feeling of Stress : To some extent  Social Connections: Socially Isolated (04/28/2023)   Social Connection and Isolation Panel [  NHANES]    Frequency of Communication with  Friends and Family: More than three times a week    Frequency of Social Gatherings with Friends and Family: Once a week    Attends Religious Services: Never    Database administrator or Organizations: No    Attends Banker Meetings: Never    Marital Status: Widowed    Tobacco Counseling Counseling given: Not Answered   Clinical Intake:  Pre-visit preparation completed: Yes  Pain : No/denies pain     BMI - recorded: 27.88 Nutritional Status: BMI 25 -29 Overweight Nutritional Risks: None Diabetes: Yes CBG done?: No Did pt. bring in CBG monitor from home?: No  How often do you need to have someone help you when you read instructions, pamphlets, or other written materials from your doctor or pharmacy?: 1 - Never  Diabetic?yes  Interpreter Needed?: No  Comments: lives alone Information entered by :: B.Tayden Nichelson,LPN   Activities of Daily Living    04/28/2023    8:29 AM 02/10/2023   10:39 AM  In your present state of health, do you have any difficulty performing the following activities:  Hearing? 0 0  Vision? 0 0  Difficulty concentrating or making decisions? 0 1  Walking or climbing stairs? 0 1  Dressing or bathing? 0 0  Doing errands, shopping? 0 0  Preparing Food and eating ? N   Using the Toilet? N   In the past six months, have you accidently leaked urine? Y   Do you have problems with loss of bowel control? N   Managing your Medications? N   Managing your Finances? N   Housekeeping or managing your Housekeeping? Y     Patient Care Team: Malva Limes, MD as PCP - General (Family Medicine) Christell Constant, MD as PCP - Cardiology (Cardiology)  Indicate any recent Medical Services you may have received from other than [facility] providers in the past year (date may be approximate).     Assessment:   This is a routine wellness examination for Shanessa.  Hearing/Vision screen Hearing Screening - Comments:: Adequate hearing Vision Screening  - Comments:: Adequate vision w/glasses Dr Hulen Luster  Dietary issues and exercise activities discussed: Current Exercise Habits: The patient does not participate in regular exercise at present, Exercise limited by: neurologic condition(s);psychological condition(s)   Goals Addressed   None    Depression Screen    04/28/2023    8:23 AM 02/23/2023   11:20 AM 02/10/2023   10:39 AM 10/16/2022    1:18 PM 03/09/2022    1:27 PM 09/26/2021   12:10 PM 03/20/2021   10:30 AM  PHQ 2/9 Scores  PHQ - 2 Score 2 2 6 2 2 2 2   PHQ- 9 Score 3  14 6 10 8 5     Fall Risk    04/28/2023    8:19 AM 02/23/2023   11:20 AM 02/10/2023   10:38 AM 10/16/2022    1:18 PM 03/09/2022    1:27 PM  Fall Risk   Falls in the past year? 1 1 1  0 1  Comment  Last fall in March 2024. No injury.     Number falls in past yr: 0 1 0 0 0  Injury with Fall? 0 0 0 0 0  Risk for fall due to : No Fall Risks  No Fall Risks No Fall Risks   Follow up Education provided;Falls prevention discussed  Falls evaluation completed Falls evaluation completed  FALL RISK PREVENTION PERTAINING TO THE HOME:  Any stairs in or around the home? Yes  If so, are there any without handrails? Yes  Home free of loose throw rugs in walkways, pet beds, electrical cords, etc? Yes  Adequate lighting in your home to reduce risk of falls? Yes   ASSISTIVE DEVICES UTILIZED TO PREVENT FALLS:  Life alert? No  Use of a cane, walker or w/c? Yes  Grab bars in the bathroom? Yes  Shower chair or bench in shower? No  Elevated toilet seat or a handicapped toilet? No   Cognitive Function:        04/28/2023    8:40 AM  6CIT Screen  What Year? 0 points  What month? 0 points  What time? 0 points  Count back from 20 0 points  Months in reverse 0 points  Repeat phrase 0 points  Total Score 0 points   Immunizations Immunization History  Administered Date(s) Administered   Fluad Quad(high Dose 65+) 10/21/2021   Influenza,inj,Quad PF,6+ Mos 08/29/2015    PFIZER(Purple Top)SARS-COV-2 Vaccination 12/26/2019, 01/16/2020, 01/10/2021   Pneumococcal Polysaccharide-23 11/22/2012   Zoster Recombinat (Shingrix) 07/19/2020, 10/25/2020    TDAP status: Due, Education has been provided regarding the importance of this vaccine. Advised may receive this vaccine at local pharmacy or Health Dept. Aware to provide a copy of the vaccination record if obtained from local pharmacy or Health Dept. Verbalized acceptance and understanding.  Flu Vaccine status: Declined, Education has been provided regarding the importance of this vaccine but patient still declined. Advised may receive this vaccine at local pharmacy or Health Dept. Aware to provide a copy of the vaccination record if obtained from local pharmacy or Health Dept. Verbalized acceptance and understanding.  Pneumococcal vaccine status: Up to date  Covid-19 vaccine status: Completed vaccines  Qualifies for Shingles Vaccine? Yes   Zostavax completed Yes   Shingrix Completed?: Yes  Screening Tests Health Maintenance  Topic Date Due   DTaP/Tdap/Td (1 - Tdap) Never done   OPHTHALMOLOGY EXAM  09/17/2017   Pneumonia Vaccine 35+ Years old (2 of 2 - PCV) 12/01/2019   COVID-19 Vaccine (4 - 2023-24 season) 08/14/2022   HEMOGLOBIN A1C  04/16/2023   INFLUENZA VACCINE  07/15/2023   Diabetic kidney evaluation - eGFR measurement  10/17/2023   Diabetic kidney evaluation - Urine ACR  10/17/2023   MAMMOGRAM  12/12/2023   Medicare Annual Wellness (AWV)  04/27/2024   COLONOSCOPY (Pts 45-22yrs Insurance coverage will need to be confirmed)  04/14/2027   DEXA SCAN  05/07/2027   Hepatitis C Screening  Completed   Zoster Vaccines- Shingrix  Completed   HPV VACCINES  Aged Out    Health Maintenance  Health Maintenance Due  Topic Date Due   DTaP/Tdap/Td (1 - Tdap) Never done   OPHTHALMOLOGY EXAM  09/17/2017   Pneumonia Vaccine 67+ Years old (2 of 2 - PCV) 12/01/2019   COVID-19 Vaccine (4 - 2023-24 season)  08/14/2022   HEMOGLOBIN A1C  04/16/2023    Colorectal cancer screening: Type of screening: Colonoscopy. Completed yes. Repeat every 5 years  Mammogram status: Ordered yes. Pt provided with contact info and advised to call to schedule appt.   Bone Density status: Completed 5. Results reflect: Bone density results: NORMAL. Repeat every 5 years.  Lung Cancer Screening: (Low Dose CT Chest recommended if Age 47-80 years, 30 pack-year currently smoking OR have quit w/in 15years.) does not qualify.   Lung Cancer Screening Referral: no  Additional Screening:  Hepatitis  C Screening: does not qualify; Completed yes  Vision Screening: Recommended annual ophthalmology exams for early detection of glaucoma and other disorders of the eye. Is the patient up to date with their annual eye exam?  Yes  Who is the provider or what is the name of the office in which the patient attends annual eye exams? Dr Alvester Morin If pt is not established with a provider, would they like to be referred to a provider to establish care? No .   Dental Screening: Recommended annual dental exams for proper oral hygiene  Community Resource Referral / Chronic Care Management: CRR required this visit?  No   CCM required this visit?  No      Plan:     I have personally reviewed and noted the following in the patient's chart:   Medical and social history Use of alcohol, tobacco or illicit drugs  Current medications and supplements including opioid prescriptions. Patient is not currently taking opioid prescriptions. Functional ability and status Nutritional status Physical activity Advanced directives List of other physicians Hospitalizations, surgeries, and ER visits in previous 12 months Vitals Screenings to include cognitive, depression, and falls Referrals and appointments  In addition, I have reviewed and discussed with patient certain preventive protocols, quality metrics, and best practice recommendations. A  written personalized care plan for preventive services as well as general preventive health recommendations were provided to patient.     Sue Lush, LPN   [id number]/82/[id number]   Nurse Notes: pt reports doing alright but sounds very down and says feels hopeless sometimes. She responds "she will be dead in 5 years".   *referral made for assessment ACO *Orders for MMG *Referral to Dentist

## 2023-04-28 NOTE — Patient Instructions (Signed)
Ms. Kocourek , Thank you for taking time to come for your Medicare Wellness Visit. I appreciate your ongoing commitment to your health goals. Please review the following plan we discussed and let me know if I can assist you in the future.   These are the goals we discussed:  Goals   None     This is a list of the screening recommended for you and due dates:  Health Maintenance  Topic Date Due   DTaP/Tdap/Td vaccine (1 - Tdap) Never done   Eye exam for diabetics  09/17/2017   Pneumonia Vaccine (2 of 2 - PCV) 12/01/2019   COVID-19 Vaccine (4 - 2023-24 season) 08/14/2022   Hemoglobin A1C  04/16/2023   Flu Shot  07/15/2023   Yearly kidney function blood test for diabetes  10/17/2023   Yearly kidney health urinalysis for diabetes  10/17/2023   Mammogram  12/12/2023   Medicare Annual Wellness Visit  04/27/2024   Colon Cancer Screening  04/14/2027   DEXA scan (bone density measurement)  05/07/2027   Hepatitis C Screening: USPSTF Recommendation to screen - Ages 78-79 yo.  Completed   Zoster (Shingles) Vaccine  Completed   HPV Vaccine  Aged Out    Advanced directives: no  Conditions/risks identified: low falls risk  Next appointment: Follow up in one year for your annual wellness visit 05/02/2024 @8 :15am telephone   Preventive Care 65 Years and Older, Female Preventive care refers to lifestyle choices and visits with your health care provider that can promote health and wellness. What does preventive care include? A yearly physical exam. This is also called an annual well check. Dental exams once or twice a year. Routine eye exams. Ask your health care provider how often you should have your eyes checked. Personal lifestyle choices, including: Daily care of your teeth and gums. Regular physical activity. Eating a healthy diet. Avoiding tobacco and drug use. Limiting alcohol use. Practicing safe sex. Taking low-dose aspirin every day. Taking vitamin and mineral supplements as  recommended by your health care provider. What happens during an annual well check? The services and screenings done by your health care provider during your annual well check will depend on your age, overall health, lifestyle risk factors, and family history of disease. Counseling  Your health care provider may ask you questions about your: Alcohol use. Tobacco use. Drug use. Emotional well-being. Home and relationship well-being. Sexual activity. Eating habits. History of falls. Memory and ability to understand (cognition). Work and work Astronomer. Reproductive health. Screening  You may have the following tests or measurements: Height, weight, and BMI. Blood pressure. Lipid and cholesterol levels. These may be checked every 5 years, or more frequently if you are over 41 years old. Skin check. Lung cancer screening. You may have this screening every year starting at age 72 if you have a 30-pack-year history of smoking and currently smoke or have quit within the past 15 years. Fecal occult blood test (FOBT) of the stool. You may have this test every year starting at age 38. Flexible sigmoidoscopy or colonoscopy. You may have a sigmoidoscopy every 5 years or a colonoscopy every 10 years starting at age 71. Hepatitis C blood test. Hepatitis B blood test. Sexually transmitted disease (STD) testing. Diabetes screening. This is done by checking your blood sugar (glucose) after you have not eaten for a while (fasting). You may have this done every 1-3 years. Bone density scan. This is done to screen for osteoporosis. You may have this done starting  at age 14. Mammogram. This may be done every 1-2 years. Talk to your health care provider about how often you should have regular mammograms. Talk with your health care provider about your test results, treatment options, and if necessary, the need for more tests. Vaccines  Your health care provider may recommend certain vaccines, such  as: Influenza vaccine. This is recommended every year. Tetanus, diphtheria, and acellular pertussis (Tdap, Td) vaccine. You may need a Td booster every 10 years. Zoster vaccine. You may need this after age 30. Pneumococcal 13-valent conjugate (PCV13) vaccine. One dose is recommended after age 45. Pneumococcal polysaccharide (PPSV23) vaccine. One dose is recommended after age 83. Talk to your health care provider about which screenings and vaccines you need and how often you need them. This information is not intended to replace advice given to you by your health care provider. Make sure you discuss any questions you have with your health care provider. Document Released: 12/27/2015 Document Revised: 08/19/2016 Document Reviewed: 10/01/2015 Elsevier Interactive Patient Education  2017 [employer].  Fall Prevention in the Home Falls can cause injuries. They can happen to people of all ages. There are many things you can do to make your home safe and to help prevent falls. What can I do on the outside of my home? Regularly fix the edges of walkways and driveways and fix any cracks. Remove anything that might make you trip as you walk through a door, such as a raised step or threshold. Trim any bushes or trees on the path to your home. Use bright outdoor lighting. Clear any walking paths of anything that might make someone trip, such as rocks or tools. Regularly check to see if handrails are loose or broken. Make sure that both sides of any steps have handrails. Any raised decks and porches should have guardrails on the edges. Have any leaves, snow, or ice cleared regularly. Use sand or salt on walking paths during winter. Clean up any spills in your garage right away. This includes oil or grease spills. What can I do in the bathroom? Use night lights. Install grab bars by the toilet and in the tub and shower. Do not use towel bars as grab bars. Use non-skid mats or decals in the tub or  shower. If you need to sit down in the shower, use a plastic, non-slip stool. Keep the floor dry. Clean up any water that spills on the floor as soon as it happens. Remove soap buildup in the tub or shower regularly. Attach bath mats securely with double-sided non-slip rug tape. Do not have throw rugs and other things on the floor that can make you trip. What can I do in the bedroom? Use night lights. Make sure that you have a light by your bed that is easy to reach. Do not use any sheets or blankets that are too big for your bed. They should not hang down onto the floor. Have a firm chair that has side arms. You can use this for support while you get dressed. Do not have throw rugs and other things on the floor that can make you trip. What can I do in the kitchen? Clean up any spills right away. Avoid walking on wet floors. Keep items that you use a lot in easy-to-reach places. If you need to reach something above you, use a strong step stool that has a grab bar. Keep electrical cords out of the way. Do not use floor polish or wax that makes  floors slippery. If you must use wax, use non-skid floor wax. Do not have throw rugs and other things on the floor that can make you trip. What can I do with my stairs? Do not leave any items on the stairs. Make sure that there are handrails on both sides of the stairs and use them. Fix handrails that are broken or loose. Make sure that handrails are as long as the stairways. Check any carpeting to make sure that it is firmly attached to the stairs. Fix any carpet that is loose or worn. Avoid having throw rugs at the top or bottom of the stairs. If you do have throw rugs, attach them to the floor with carpet tape. Make sure that you have a light switch at the top of the stairs and the bottom of the stairs. If you do not have them, ask someone to add them for you. What else can I do to help prevent falls? Wear shoes that: Do not have high heels. Have  rubber bottoms. Are comfortable and fit you well. Are closed at the toe. Do not wear sandals. If you use a stepladder: Make sure that it is fully opened. Do not climb a closed stepladder. Make sure that both sides of the stepladder are locked into place. Ask someone to hold it for you, if possible. Clearly mark and make sure that you can see: Any grab bars or handrails. First and last steps. Where the edge of each step is. Use tools that help you move around (mobility aids) if they are needed. These include: Canes. Walkers. Scooters. Crutches. Turn on the lights when you go into a dark area. Replace any light bulbs as soon as they burn out. Set up your furniture so you have a clear path. Avoid moving your furniture around. If any of your floors are uneven, fix them. If there are any pets around you, be aware of where they are. Review your medicines with your doctor. Some medicines can make you feel dizzy. This can increase your chance of falling. Ask your doctor what other things that you can do to help prevent falls. This information is not intended to replace advice given to you by your health care provider. Make sure you discuss any questions you have with your health care provider. Document Released: 09/26/2009 Document Revised: 05/07/2016 Document Reviewed: 01/04/2015 Elsevier Interactive Patient Education  2017 [employer].

## 2023-04-30 ENCOUNTER — Ambulatory Visit: Payer: PPO

## 2023-04-30 ENCOUNTER — Telehealth: Payer: Self-pay | Admitting: *Deleted

## 2023-04-30 NOTE — Progress Notes (Unsigned)
  Care Coordination  Outreach Note  04/30/2023 Name: ENSLEIGH CAPILLA MRN: [id number] DOB: [birth year]   Care Coordination Outreach Attempts: An unsuccessful telephone outreach was attempted today to offer the patient information about available care coordination services.  Follow Up Plan:  Additional outreach attempts will be made to offer the patient care coordination information and services.   Encounter Outcome:  No Answer  Burman Nieves, CCMA Care Coordination Care Guide Direct Dial: [phone]

## 2023-05-04 NOTE — Progress Notes (Unsigned)
  Care Coordination  Outreach Note  05/04/2023 Name: [name] MRN: [id number] DOB: 1956/05/13   Care Coordination Outreach Attempts: A second unsuccessful outreach was attempted today to offer the patient with information about available care coordination services.  Follow Up Plan:  Additional outreach attempts will be made to offer the patient care coordination information and services.   Encounter Outcome:  No Answer  Burman Nieves, CCMA Care Coordination Care Guide Direct Dial: [phone]

## 2023-05-05 NOTE — Progress Notes (Signed)
  Care Coordination   Note   05/05/2023 Name: [name] MRN: [id number] DOB: 1956-10-18  [name] is a 67 y.o. year old female who sees Fisher, Demetrios Isaacs, MD for primary care. I reached out to Orlando Penner by phone today to offer care coordination services.  [name] was given information about Care Coordination services today including:   The Care Coordination services include support from the care team which includes your Nurse Coordinator, Clinical Social Worker, or Pharmacist.  The Care Coordination team is here to help remove barriers to the health concerns and goals most important to you. Care Coordination services are voluntary, and the patient may decline or stop services at any time by request to their care team member.   Care Coordination Consent Status: Patient did not agree to participate in care coordination services at this time.  Follow up plan:  pt declined need for social work referral at this time   Encounter Outcome:  Pt. Refused  Burman Nieves, CCMA Care Coordination Care Guide Direct Dial: [phone]

## 2023-05-12 ENCOUNTER — Encounter: Payer: PPO | Admitting: Occupational Therapy

## 2023-05-19 ENCOUNTER — Encounter: Payer: PPO | Admitting: Occupational Therapy

## 2023-05-20 ENCOUNTER — Other Ambulatory Visit: Payer: Self-pay

## 2023-05-21 ENCOUNTER — Other Ambulatory Visit: Payer: Self-pay

## 2023-05-31 ENCOUNTER — Emergency Department: Payer: PPO

## 2023-05-31 ENCOUNTER — Encounter: Payer: Self-pay | Admitting: Radiology

## 2023-05-31 ENCOUNTER — Inpatient Hospital Stay: Payer: PPO

## 2023-05-31 ENCOUNTER — Inpatient Hospital Stay
Admission: EM | Admit: 2023-05-31 | Discharge: 2023-06-11 | DRG: 521 | Disposition: A | Discharge status: Skilled Nursing Facility | Payer: PPO | Attending: Internal Medicine | Admitting: Internal Medicine

## 2023-05-31 ENCOUNTER — Other Ambulatory Visit: Payer: Self-pay

## 2023-05-31 DIAGNOSIS — M1990 Unspecified osteoarthritis, unspecified site: Secondary | ICD-10-CM | POA: Diagnosis present

## 2023-05-31 DIAGNOSIS — F419 Anxiety disorder, unspecified: Secondary | ICD-10-CM | POA: Diagnosis present

## 2023-05-31 DIAGNOSIS — E222 Syndrome of inappropriate secretion of antidiuretic hormone: Secondary | ICD-10-CM | POA: Diagnosis present

## 2023-05-31 DIAGNOSIS — E162 Hypoglycemia, unspecified: Secondary | ICD-10-CM | POA: Diagnosis not present

## 2023-05-31 DIAGNOSIS — E878 Other disorders of electrolyte and fluid balance, not elsewhere classified: Secondary | ICD-10-CM | POA: Diagnosis present

## 2023-05-31 DIAGNOSIS — I10 Essential (primary) hypertension: Secondary | ICD-10-CM | POA: Diagnosis not present

## 2023-05-31 DIAGNOSIS — Z87891 Personal history of nicotine dependence: Secondary | ICD-10-CM | POA: Diagnosis not present

## 2023-05-31 DIAGNOSIS — F32A Depression, unspecified: Secondary | ICD-10-CM | POA: Diagnosis not present

## 2023-05-31 DIAGNOSIS — E1136 Type 2 diabetes mellitus with diabetic cataract: Secondary | ICD-10-CM | POA: Diagnosis present

## 2023-05-31 DIAGNOSIS — Z471 Aftercare following joint replacement surgery: Secondary | ICD-10-CM | POA: Diagnosis not present

## 2023-05-31 DIAGNOSIS — Z96642 Presence of left artificial hip joint: Secondary | ICD-10-CM | POA: Diagnosis not present

## 2023-05-31 DIAGNOSIS — Z96641 Presence of right artificial hip joint: Secondary | ICD-10-CM | POA: Diagnosis present

## 2023-05-31 DIAGNOSIS — B961 Klebsiella pneumoniae [K. pneumoniae] as the cause of diseases classified elsewhere: Secondary | ICD-10-CM | POA: Diagnosis present

## 2023-05-31 DIAGNOSIS — R278 Other lack of coordination: Secondary | ICD-10-CM | POA: Diagnosis not present

## 2023-05-31 DIAGNOSIS — Z7982 Long term (current) use of aspirin: Secondary | ICD-10-CM

## 2023-05-31 DIAGNOSIS — R21 Rash and other nonspecific skin eruption: Secondary | ICD-10-CM | POA: Diagnosis not present

## 2023-05-31 DIAGNOSIS — E785 Hyperlipidemia, unspecified: Secondary | ICD-10-CM | POA: Diagnosis present

## 2023-05-31 DIAGNOSIS — G928 Other toxic encephalopathy: Secondary | ICD-10-CM | POA: Diagnosis not present

## 2023-05-31 DIAGNOSIS — Z736 Limitation of activities due to disability: Secondary | ICD-10-CM | POA: Diagnosis not present

## 2023-05-31 DIAGNOSIS — Z952 Presence of prosthetic heart valve: Secondary | ICD-10-CM

## 2023-05-31 DIAGNOSIS — T40425A Adverse effect of tramadol, initial encounter: Secondary | ICD-10-CM | POA: Diagnosis not present

## 2023-05-31 DIAGNOSIS — K219 Gastro-esophageal reflux disease without esophagitis: Secondary | ICD-10-CM | POA: Diagnosis present

## 2023-05-31 DIAGNOSIS — M810 Age-related osteoporosis without current pathological fracture: Secondary | ICD-10-CM | POA: Diagnosis not present

## 2023-05-31 DIAGNOSIS — M80052D Age-related osteoporosis with current pathological fracture, left femur, subsequent encounter for fracture with routine healing: Secondary | ICD-10-CM | POA: Diagnosis not present

## 2023-05-31 DIAGNOSIS — M6259 Muscle wasting and atrophy, not elsewhere classified, multiple sites: Secondary | ICD-10-CM | POA: Diagnosis not present

## 2023-05-31 DIAGNOSIS — Z951 Presence of aortocoronary bypass graft: Secondary | ICD-10-CM

## 2023-05-31 DIAGNOSIS — E871 Hypo-osmolality and hyponatremia: Secondary | ICD-10-CM | POA: Diagnosis present

## 2023-05-31 DIAGNOSIS — Y990 Civilian activity done for income or pay: Secondary | ICD-10-CM

## 2023-05-31 DIAGNOSIS — I69354 Hemiplegia and hemiparesis following cerebral infarction affecting left non-dominant side: Secondary | ICD-10-CM | POA: Diagnosis not present

## 2023-05-31 DIAGNOSIS — Z8249 Family history of ischemic heart disease and other diseases of the circulatory system: Secondary | ICD-10-CM

## 2023-05-31 DIAGNOSIS — Z7401 Bed confinement status: Secondary | ICD-10-CM | POA: Diagnosis not present

## 2023-05-31 DIAGNOSIS — G8194 Hemiplegia, unspecified affecting left nondominant side: Secondary | ICD-10-CM | POA: Diagnosis not present

## 2023-05-31 DIAGNOSIS — R0902 Hypoxemia: Secondary | ICD-10-CM | POA: Diagnosis not present

## 2023-05-31 DIAGNOSIS — E860 Dehydration: Secondary | ICD-10-CM | POA: Diagnosis present

## 2023-05-31 DIAGNOSIS — Z7984 Long term (current) use of oral hypoglycemic drugs: Secondary | ICD-10-CM

## 2023-05-31 DIAGNOSIS — Z8673 Personal history of transient ischemic attack (TIA), and cerebral infarction without residual deficits: Secondary | ICD-10-CM | POA: Diagnosis not present

## 2023-05-31 DIAGNOSIS — I160 Hypertensive urgency: Secondary | ICD-10-CM | POA: Diagnosis not present

## 2023-05-31 DIAGNOSIS — Z888 Allergy status to other drugs, medicaments and biological substances status: Secondary | ICD-10-CM

## 2023-05-31 DIAGNOSIS — I6930 Unspecified sequelae of cerebral infarction: Secondary | ICD-10-CM

## 2023-05-31 DIAGNOSIS — Z79899 Other long term (current) drug therapy: Secondary | ICD-10-CM

## 2023-05-31 DIAGNOSIS — S72032A Displaced midcervical fracture of left femur, initial encounter for closed fracture: Secondary | ICD-10-CM | POA: Diagnosis not present

## 2023-05-31 DIAGNOSIS — Z800 Family history of malignant neoplasm of digestive organs: Secondary | ICD-10-CM

## 2023-05-31 DIAGNOSIS — E1165 Type 2 diabetes mellitus with hyperglycemia: Secondary | ICD-10-CM | POA: Diagnosis present

## 2023-05-31 DIAGNOSIS — Y929 Unspecified place or not applicable: Secondary | ICD-10-CM

## 2023-05-31 DIAGNOSIS — M25552 Pain in left hip: Secondary | ICD-10-CM | POA: Diagnosis not present

## 2023-05-31 DIAGNOSIS — N390 Urinary tract infection, site not specified: Secondary | ICD-10-CM | POA: Diagnosis not present

## 2023-05-31 DIAGNOSIS — Z880 Allergy status to penicillin: Secondary | ICD-10-CM

## 2023-05-31 DIAGNOSIS — Z91199 Patient's noncompliance with other medical treatment and regimen due to unspecified reason: Secondary | ICD-10-CM

## 2023-05-31 DIAGNOSIS — Z01818 Encounter for other preprocedural examination: Secondary | ICD-10-CM | POA: Diagnosis not present

## 2023-05-31 DIAGNOSIS — W010XXA Fall on same level from slipping, tripping and stumbling without subsequent striking against object, initial encounter: Secondary | ICD-10-CM | POA: Diagnosis present

## 2023-05-31 DIAGNOSIS — S72002A Fracture of unspecified part of neck of left femur, initial encounter for closed fracture: Secondary | ICD-10-CM | POA: Diagnosis not present

## 2023-05-31 DIAGNOSIS — I2510 Atherosclerotic heart disease of native coronary artery without angina pectoris: Secondary | ICD-10-CM | POA: Diagnosis present

## 2023-05-31 DIAGNOSIS — W19XXXA Unspecified fall, initial encounter: Secondary | ICD-10-CM | POA: Diagnosis not present

## 2023-05-31 DIAGNOSIS — E119 Type 2 diabetes mellitus without complications: Secondary | ICD-10-CM | POA: Diagnosis not present

## 2023-05-31 DIAGNOSIS — S72002D Fracture of unspecified part of neck of left femur, subsequent encounter for closed fracture with routine healing: Secondary | ICD-10-CM | POA: Diagnosis not present

## 2023-05-31 DIAGNOSIS — G479 Sleep disorder, unspecified: Secondary | ICD-10-CM | POA: Diagnosis not present

## 2023-05-31 DIAGNOSIS — Z820 Family history of epilepsy and other diseases of the nervous system: Secondary | ICD-10-CM

## 2023-05-31 DIAGNOSIS — N3001 Acute cystitis with hematuria: Secondary | ICD-10-CM | POA: Diagnosis not present

## 2023-05-31 DIAGNOSIS — Z833 Family history of diabetes mellitus: Secondary | ICD-10-CM

## 2023-05-31 DIAGNOSIS — F3289 Other specified depressive episodes: Secondary | ICD-10-CM | POA: Diagnosis not present

## 2023-05-31 LAB — CBC WITH DIFFERENTIAL/PLATELET
Abs Immature Granulocytes: 0.11 10*3/uL — ABNORMAL HIGH (ref 0.00–0.07)
Basophils Absolute: 0 10*3/uL (ref 0.0–0.1)
Basophils Relative: 0 %
Eosinophils Absolute: 0.1 10*3/uL (ref 0.0–0.5)
Eosinophils Relative: 2 %
HCT: 43.7 % (ref 36.0–46.0)
Hemoglobin: 15.2 g/dL — ABNORMAL HIGH (ref 12.0–15.0)
Immature Granulocytes: 1 %
Lymphocytes Relative: 11 %
Lymphs Abs: 1 10*3/uL (ref 0.7–4.0)
MCH: 28.6 pg (ref 26.0–34.0)
MCHC: 34.8 g/dL (ref 30.0–36.0)
MCV: 82.1 fL (ref 80.0–100.0)
Monocytes Absolute: 0.6 10*3/uL (ref 0.1–1.0)
Monocytes Relative: 6 %
Neutro Abs: 7.7 10*3/uL (ref 1.7–7.7)
Neutrophils Relative %: 80 %
Platelets: 260 10*3/uL (ref 150–400)
RBC: 5.32 MIL/uL — ABNORMAL HIGH (ref 3.87–5.11)
RDW: 12.1 % (ref 11.5–15.5)
WBC: 9.6 10*3/uL (ref 4.0–10.5)
nRBC: 0 % (ref 0.0–0.2)

## 2023-05-31 LAB — COMPREHENSIVE METABOLIC PANEL
ALT: 12 U/L (ref 0–44)
AST: 19 U/L (ref 15–41)
Albumin: 4.4 g/dL (ref 3.5–5.0)
Alkaline Phosphatase: 64 U/L (ref 38–126)
Anion gap: 13 (ref 5–15)
BUN: 21 mg/dL (ref 8–23)
CO2: 23 mmol/L (ref 22–32)
Calcium: 9 mg/dL (ref 8.9–10.3)
Chloride: 91 mmol/L — ABNORMAL LOW (ref 98–111)
Creatinine, Ser: 1.02 mg/dL — ABNORMAL HIGH (ref 0.44–1.00)
GFR, Estimated: 60 mL/min — ABNORMAL LOW (ref >60)
Glucose, Bld: 466 mg/dL — ABNORMAL HIGH (ref 70–99)
Potassium: 3.9 mmol/L (ref 3.5–5.1)
Sodium: 127 mmol/L — ABNORMAL LOW (ref 135–145)
Total Bilirubin: 0.7 mg/dL (ref 0.3–1.2)
Total Protein: 8.9 g/dL — ABNORMAL HIGH (ref 6.5–8.1)

## 2023-05-31 MED ORDER — ADULT MULTIVITAMIN W/MINERALS CH
1.0000 | ORAL_TABLET | Freq: Every day | ORAL | Status: DC
  Administered 2023-06-01 – 2023-06-11 (×10): 1 via ORAL
  Filled 2023-05-31 (×10): qty 1

## 2023-05-31 MED ORDER — ONDANSETRON HCL 4 MG/2ML IJ SOLN: 4.0000 mg | INTRAMUSCULAR | Status: DC | PRN

## 2023-05-31 MED ORDER — LATANOPROST 0.005 % OP SOLN
1.0000 [drp] | Freq: Every day | OPHTHALMIC | Status: DC
  Administered 2023-06-01 – 2023-06-10 (×9): 1 [drp] via OPHTHALMIC
  Filled 2023-05-31 (×2): qty 2.5

## 2023-05-31 MED ORDER — MORPHINE SULFATE (PF) 2 MG/ML IV SOLN
1.0000 mg | Freq: Once | INTRAVENOUS | Status: DC
  Filled 2023-05-31: qty 1

## 2023-05-31 MED ORDER — AMLODIPINE BESYLATE 10 MG PO TABS
10.0000 mg | ORAL_TABLET | Freq: Every day | ORAL | Status: DC
  Administered 2023-06-01 – 2023-06-06 (×6): 10 mg via ORAL
  Filled 2023-05-31 (×4): qty 1
  Filled 2023-05-31 (×2): qty 2
  Filled 2023-05-31: qty 1

## 2023-05-31 MED ORDER — MORPHINE SULFATE (PF) 2 MG/ML IV SOLN
0.5000 mg | INTRAVENOUS | Status: DC | PRN
  Administered 2023-05-31 – 2023-06-02 (×7): 0.5 mg via INTRAVENOUS
  Filled 2023-05-31 (×7): qty 1

## 2023-05-31 MED ORDER — HYDROCODONE-ACETAMINOPHEN 5-325 MG PO TABS
1.0000 | ORAL_TABLET | Freq: Four times a day (QID) | ORAL | Status: DC | PRN
  Administered 2023-06-01 (×2): 1 via ORAL
  Administered 2023-06-01 (×2): 2 via ORAL
  Filled 2023-05-31 (×2): qty 1
  Filled 2023-05-31 (×2): qty 2

## 2023-05-31 MED ORDER — ROSUVASTATIN CALCIUM 10 MG PO TABS
40.0000 mg | ORAL_TABLET | Freq: Every day | ORAL | Status: DC
  Administered 2023-06-01 – 2023-06-11 (×10): 40 mg via ORAL
  Filled 2023-05-31: qty 2
  Filled 2023-05-31 (×9): qty 4

## 2023-05-31 MED ORDER — MAGNESIUM HYDROXIDE 400 MG/5ML PO SUSP
30.0000 mL | Freq: Every day | ORAL | Status: DC | PRN
  Administered 2023-06-03: 30 mL via ORAL
  Filled 2023-05-31: qty 30

## 2023-05-31 MED ORDER — LOSARTAN POTASSIUM 50 MG PO TABS
100.0000 mg | ORAL_TABLET | Freq: Every day | ORAL | Status: DC
  Administered 2023-06-01 – 2023-06-06 (×5): 100 mg via ORAL
  Filled 2023-05-31 (×6): qty 2

## 2023-05-31 MED ORDER — LATANOPROST 0.005 % OP SOLN: 1.0000 [drp] | Freq: Every evening | OPHTHALMIC | Status: DC

## 2023-05-31 MED ORDER — SODIUM CHLORIDE 0.9 % IV SOLN: INTRAVENOUS | Status: DC

## 2023-05-31 MED ORDER — INSULIN ASPART 100 UNIT/ML IJ SOLN
0.0000 [IU] | Freq: Four times a day (QID) | INTRAMUSCULAR | Status: DC
  Administered 2023-06-01 (×2): 2 [IU] via SUBCUTANEOUS
  Administered 2023-06-01: 3 [IU] via SUBCUTANEOUS
  Administered 2023-06-01: 6 [IU] via SUBCUTANEOUS
  Administered 2023-06-01: 9 [IU] via SUBCUTANEOUS
  Administered 2023-06-02: 5 [IU] via SUBCUTANEOUS
  Administered 2023-06-02: 3 [IU] via SUBCUTANEOUS
  Administered 2023-06-03: 5 [IU] via SUBCUTANEOUS
  Administered 2023-06-03 (×2): 2 [IU] via SUBCUTANEOUS
  Administered 2023-06-03: 7 [IU] via SUBCUTANEOUS
  Filled 2023-05-31 (×12): qty 1

## 2023-05-31 MED ORDER — MORPHINE SULFATE (PF) 2 MG/ML IV SOLN
0.5000 mg | Freq: Once | INTRAVENOUS | Status: AC
  Administered 2023-05-31: 0.5 mg via INTRAVENOUS

## 2023-05-31 MED ORDER — ALPRAZOLAM 0.5 MG PO TABS
0.5000 mg | ORAL_TABLET | Freq: Two times a day (BID) | ORAL | Status: DC | PRN
  Administered 2023-06-01 – 2023-06-10 (×2): 0.5 mg via ORAL
  Filled 2023-05-31 (×2): qty 1

## 2023-05-31 MED ORDER — TRAZODONE HCL 50 MG PO TABS
25.0000 mg | ORAL_TABLET | Freq: Every evening | ORAL | Status: DC | PRN
  Administered 2023-06-01 – 2023-06-10 (×4): 25 mg via ORAL
  Filled 2023-05-31 (×4): qty 1

## 2023-05-31 MED ORDER — ACETAMINOPHEN 325 MG PO TABS
650.0000 mg | ORAL_TABLET | ORAL | Status: DC | PRN
  Administered 2023-06-06 – 2023-06-10 (×2): 650 mg via ORAL
  Filled 2023-05-31 (×2): qty 2

## 2023-05-31 MED ORDER — SERTRALINE HCL 50 MG PO TABS
25.0000 mg | ORAL_TABLET | Freq: Every day | ORAL | Status: DC
  Administered 2023-06-01 – 2023-06-10 (×11): 25 mg via ORAL
  Filled 2023-05-31 (×11): qty 1

## 2023-05-31 MED ORDER — MONTELUKAST SODIUM 10 MG PO TABS
10.0000 mg | ORAL_TABLET | Freq: Every day | ORAL | Status: DC
  Administered 2023-06-01 – 2023-06-10 (×11): 10 mg via ORAL
  Filled 2023-05-31 (×11): qty 1

## 2023-05-31 MED ORDER — EMPAGLIFLOZIN 10 MG PO TABS
10.0000 mg | ORAL_TABLET | Freq: Every day | ORAL | Status: DC
  Administered 2023-06-01 – 2023-06-07 (×7): 10 mg via ORAL
  Filled 2023-05-31 (×8): qty 1

## 2023-05-31 MED ORDER — OXYBUTYNIN CHLORIDE ER 5 MG PO TB24
10.0000 mg | ORAL_TABLET | Freq: Every day | ORAL | Status: DC
  Administered 2023-06-01 – 2023-06-11 (×10): 10 mg via ORAL
  Filled 2023-05-31 (×3): qty 2
  Filled 2023-05-31: qty 1
  Filled 2023-05-31 (×6): qty 2

## 2023-05-31 MED ORDER — GLYBURIDE 5 MG PO TABS
10.0000 mg | ORAL_TABLET | Freq: Two times a day (BID) | ORAL | Status: DC
  Filled 2023-05-31 (×2): qty 2

## 2023-05-31 NOTE — Assessment & Plan Note (Signed)
-   The patient will be placed on supplement coverage with NovoLog. - We will hold off metformin. - We will continue glyburide and Jardiance.

## 2023-05-31 NOTE — H&P (Incomplete)
[place]   PATIENT NAME: [name]    MR#:  [id number]  DATE OF BIRTH:  [employer] Veasey 07, 1957  DATE OF ADMISSION:  05/31/2023  PRIMARY CARE PHYSICIAN: Malva Limes, MD   Patient is coming from: Home  REQUESTING/REFERRING PHYSICIAN: Kern Reap A, PA-C    CHIEF COMPLAINT:   Chief Complaint  Patient presents with   Fall    HISTORY OF PRESENT ILLNESS:  [name] is a 67 y.o. female with medical history significant for disease status post four-vessel CABG, type 2 diabetes mellitus, hypertension, CVA, dyslipidemia, GERD, depression and anxiety, who presented to the ER, who presented to the emergency room with acute onset of accidental mechanical fall with subsequent left hip pain and inability to bear weight on it.  She was going to the bathroom at work and apparently slipped and fell.  The patient denies any paresthesias or focal muscle weakness.  No presyncope or syncope.  No chest pain or palpitations.  No cough or wheezing.  No nausea or vomiting or abdominal pain.  No headache or dizziness or blurred vision.  She has not been taking several of her medications for diabetes mellitus and hypertension as well as dyslipidemia.  She stated that because of her cataracts she is having hard time reading bubbles and because of her work schedule she eats 1 meal per day.  She was not advised medically to hold any of her medications.  ED Course: When the patient came to the ER, BP was 221/83 with otherwise normal vital signs.  Labs revealed mild hyponatremia of 127 and hypochloremia of 91, hyperglycemia of 466 with total protein of 8.9 and otherwise unremarkable CMP. EKG as reviewed by me : EKG showed normal sinus rhythm with a rate of 85 with first-degree AV block, left anterior fascicular block and minimal voltage criteria for LVH with Q waves septally and inferiorly Imaging: Hip x-ray showed acute left femoral neck fracture.  The patient was given 0.5 mg of IV morphine sulfate.   She will be admitted to a medical bed for further evaluation and management. PAST MEDICAL HISTORY:   Past Medical History:  Diagnosis Date   Anxiety    Arthritis    CAD (coronary artery disease)    a. 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 s/p CABG x4 LIMA to LAD, SVG to PDA, and Sequential SVG to OM and Diagonal on 8/   Stroke (HCC) 04/2021   admitted at [facility] - left sided weakness   Urinary incontinence    as stated by patient    PAST SURGICAL HISTORY:   Past Surgical History:  Procedure Laterality Date   AORTIC VALVE REPLACEMENT N/A 08/04/2021   Procedure: RESECTION AORTIC VALVE TUMOR;  Surgeon: Loreli Slot, MD;  Location: [facility] OR;  Service: Open  Heart Surgery;  Laterality: N/A;   APPLICATION OF WOUND VAC N/A 08/24/2021   Procedure: APPLICATION OF WOUND VAC;  Surgeon: Alleen Borne, MD;  Location: [facility] OR;  Service: Thoracic;  Laterality: N/A;   APPLICATION OF WOUND VAC N/A 08/28/2021   Procedure: WOUND VAC CHANGE;  Surgeon: Loreli Slot, MD;  Location: [facility] OR;  Service: Thoracic;  Laterality: N/A;   BREAST EXCISIONAL BIOPSY Right 1999   neg   BUBBLE STUDY  04/24/2021   Procedure: BUBBLE STUDY;  Surgeon: Quintella Reichert, MD;  Location: [facility] ENDOSCOPY;  Service: Cardiovascular;;   CATARACT EXTRACTION  12/2011   CATARACT EXTRACTION W/PHACO Left 12/02/2016   Procedure: CATARACT EXTRACTION PHACO AND INTRAOCULAR LENS PLACEMENT (IOC);   Surgeon: Sallee Lange, MD;  Location: [facility] ORS;  Service: Ophthalmology;  Laterality: Left;  [place] 2:14 AP% 28.4 CDE 59.73 Fluid pack lot # [id number]   COLONOSCOPY WITH PROPOFOL N/A 04/13/2022   Procedure: COLONOSCOPY WITH PROPOFOL;  Surgeon: Toney Reil, MD;  Location: [facility] ENDOSCOPY;  Service: Gastroenterology;  Laterality: N/A;   CORONARY ARTERY BYPASS GRAFT N/A 08/04/2021   Procedure: CORONARY ARTERY BYPASS GRAFTING (CABG) X  4  USING LEFT INTERNAL MAMMARY ARTERY AND RIGHT GREATER SAPHENOUS VEIN ENDOSCOPIC CONDUITS;  Surgeon: Loreli Slot, MD;  Location: [facility] OR;  Service: Open Heart Surgery;  Laterality: N/A;   DIAGNOSTIC LAPAROSCOPY     EYE SURGERY     IR CT HEAD LTD  04/19/2021   IR PERCUTANEOUS ART THROMBECTOMY/INFUSION INTRACRANIAL INC DIAG ANGIO  04/19/2021       IR PERCUTANEOUS ART THROMBECTOMY/INFUSION INTRACRANIAL INC DIAG ANGIO  04/19/2021   IR US GUIDE VASC ACCESS LEFT  04/19/2021   RADIOLOGY WITH ANESTHESIA N/A 04/19/2021   Procedure: IR WITH ANESTHESIA;  Surgeon: Julieanne Cotton, MD;  Location: [facility] OR;  Service: Radiology;  Laterality: N/A;   STERNAL WOUND DEBRIDEMENT N/A 08/24/2021   Procedure: STERNAL WOUND DRAINAGE AND DEBRIDEMENT;  Surgeon: Alleen Borne, MD;  Location: [facility] OR;  Service: Thoracic;  Laterality: N/A;   STERNAL WOUND DEBRIDEMENT N/A 08/28/2021   Procedure: STERNAL WOUND DEBRIDEMENT;  Surgeon: Loreli Slot, MD;  Location: [facility] OR;  Service: Thoracic;  Laterality: N/A;   TEE WITHOUT CARDIOVERSION N/A 04/24/2021   Procedure: TRANSESOPHAGEAL ECHOCARDIOGRAM (TEE);  Surgeon: Quintella Reichert, MD;  Location: [facility] ENDOSCOPY;  Service: Cardiovascular;  Laterality: N/A;   TEE WITHOUT CARDIOVERSION N/A 08/04/2021   Procedure: TRANSESOPHAGEAL ECHOCARDIOGRAM (TEE);  Surgeon: Loreli Slot, MD;  Location: [facility] OR;  Service: Open Heart Surgery;  Laterality: N/A;   TOTAL HIP ARTHROPLASTY Right 04/04/2018   Procedure: TOTAL HIP ARTHROPLASTY;   Surgeon: Donato Heinz, MD;  Location: [facility] ORS;  Service: Orthopedics;  Laterality: Right;    SOCIAL HISTORY:   Social History   Tobacco Use   Smoking status: Former    Packs/day: 1.50    Years: 25.00    Additional pack years: 0.00    Total pack years: 37.50    Types: Cigarettes    Quit date: 03/24/2007    Years since quitting: 16.2   Smokeless tobacco: Never  Substance Use Topics   Alcohol use: Not Currently    FAMILY HISTORY:   Family History  Problem Relation Age of Onset   Bone cancer Brother    Aneurysm Mother        brain   Diabetes Father    CAD Father    Heart failure Father    Colon cancer Father    Cancer  Father    Alzheimer's disease Paternal Grandmother    Dementia Paternal Grandmother    Mental illness Paternal Grandfather    Healthy Daughter    Healthy Son    Breast cancer Neg Hx     DRUG ALLERGIES:   Allergies  Allergen Reactions   Jardiance [Empagliflozin] Itching    Recurrent mycotic infections   Penicillins Rash and Other (See Comments)    Has patient had a PCN reaction causing immediate rash, facial/tongue/throat swelling, SOB or lightheadedness with hypotension: No Has patient had a PCN reaction causing severe rash involving mucus membranes or skin necrosis: No Has patient had a PCN reaction that required hospitalization No Has patient had a PCN reaction occurring within the last 10 years: No If all of the above answers are "NO", then may proceed with Cephalosporin use.     REVIEW OF SYSTEMS:   ROS As per history of present illness. All pertinent systems were reviewed above. Constitutional, HEENT, cardiovascular, respiratory, GI, GU, musculoskeletal, neuro, psychiatric, endocrine, integumentary and hematologic systems were reviewed and are otherwise negative/unremarkable except for positive findings mentioned above in the HPI.   MEDICATIONS AT HOME:   Prior to Admission medications   Medication Sig Start Date End Date Taking?  Authorizing Provider  acetaminophen (TYLENOL) 325 MG tablet Take 2 tablets (650 mg total) by mouth every 4 (four) hours as needed for mild pain (or temp > 37.5 C (99.5 F)). 04/29/21  Yes Olivencia-Simmons, Deforest Hoyles, NP  ALPRAZolam (XANAX) 0.5 MG tablet Take 1 tablet (0.5 mg total) by mouth 2 (two) times daily as needed for anxiety 11/27/22  Yes Raulkar, Drema Pry, MD  Blood Glucose Monitoring Suppl (BLOOD GLUCOSE MONITOR SYSTEM) w/Device KIT 1 each by Does not apply route in the morning, at noon, and at bedtime. 03/22/23   Malva Limes, MD  amLODipine (NORVASC) 10 MG tablet Take 1 tablet (10 mg total) by mouth daily. 12/05/21   Malva Limes, MD  aspirin 81 MG EC tablet Take 1 tablet (81 mg total) by mouth daily. Swallow whole. 03/09/22   Malva Limes, MD  Blood Glucose Monitoring Suppl (FREESTYLE FREEDOM LITE) w/Device KIT USE AS DIRECTED 11/21/21   Malva Limes, MD  ciprofloxacin (CILOXAN) 0.3 % ophthalmic solution Place 1 drop in affected eye every 4 hours while awake Patient not taking: Reported on 05/31/2023 02/10/23   Malva Limes, MD  empagliflozin (JARDIANCE) 10 MG TABS tablet Take 1 tablet (10 mg total) by mouth daily before breakfast. Patient not taking: Reported on 02/23/2023 10/17/22   Merita Norton T, FNP  glucose blood (FREESTYLE LITE) test strip Also needs Lancets. Use to check blood sugar up to four times a day for insulin dependant diabetes 06/20/21   Malva Limes, MD  glyBURIDE (DIABETA) 5 MG tablet Take 2 tablets (10 mg total) by mouth 2 (two) times daily with a meal. Patient not taking: Reported on 05/31/2023 10/17/22   Jacky Kindle, FNP  latanoprost (XALATAN) 0.005 % ophthalmic solution Place 1 drop into the left eye at bedtime. 02/24/22   [provider]  latanoprost (XALATAN) 0.005 % ophthalmic solution Place 1 drop into both eyes Nightly. 03/11/23     losartan (COZAAR) 100 MG tablet Take 1 tablet (100 mg total) by mouth daily. 08/25/22   Christell Constant, MD  metFORMIN (GLUCOPHAGE-XR) 500 MG 24 hr tablet Take 2 tablets (1,000 mg total) by mouth 2 (two) times daily with a meal. 10/17/22   Jacky Kindle,  FNP  montelukast (SINGULAIR) 10 MG tablet Take 1 tablet (10 mg total) by mouth at bedtime. 10/16/22   Jacky Kindle, FNP  Multiple Vitamin (MULTIVITAMIN) tablet Take 1 tablet by mouth daily.    [provider]  oxybutynin (DITROPAN-XL) 10 MG 24 hr tablet Take 1 tablet (10 mg total) by mouth daily. 02/01/23   Vanna Scotland, MD  rosuvastatin (CRESTOR) 40 MG tablet Take 1 tablet (40 mg total) by mouth daily. 10/17/22   Jacky Kindle, FNP  sertraline (ZOLOFT) 25 MG tablet Take 1 tablet by mouth at bedtime. 07/07/22   Raulkar, Drema Pry, MD  Vitamin D, Ergocalciferol, (DRISDOL) 1.25 MG (50000 UNIT) CAPS capsule Take 1 capsule (50,000 Units total) by mouth every 7 (seven) days. 10/17/22   Jacky Kindle, FNP      VITAL SIGNS:  Blood pressure (!) 182/87, pulse 87, temperature 98.3 F (36.8 C), temperature source Oral, resp. rate 16, height 5\' 7"  (1.702 m), weight 79.4 kg, SpO2 93 %.  PHYSICAL EXAMINATION:  Physical Exam  GENERAL:  67 y.o.-year-old Caucasian female patient lying in the bed with no acute distress.  EYES: Pupils equal, round, reactive to light and accommodation. No scleral icterus. Extraocular muscles intact.  HEENT: Head atraumatic, normocephalic. Oropharynx and nasopharynx clear.  NECK:  Supple, no jugular venous distention. No thyroid enlargement, no tenderness.  LUNGS: Normal breath sounds bilaterally, no wheezing, rales,rhonchi or crepitation. No use of accessory muscles of respiration.  CARDIOVASCULAR: Regular rate and rhythm, S1, S2 normal. No murmurs, rubs, or gallops.  ABDOMEN: Soft, nondistended, nontender. Bowel sounds present. No organomegaly or mass.  EXTREMITIES: No pedal edema, cyanosis, or clubbing. Musculoskeletal: Left medial and lateral hip tenderness. NEUROLOGIC: Cranial nerves II through XII are intact.  Muscle strength 5/5 in all extremities. Sensation intact. Gait not checked.  PSYCHIATRIC: The patient is alert and oriented x 3.  Normal affect and good eye contact. SKIN: No obvious rash, lesion, or ulcer.   LABORATORY PANEL:   CBC Recent Labs  Lab 05/31/23 2104  WBC 9.6  HGB 15.2*  HCT 43.7  PLT 260   ------------------------------------------------------------------------------------------------------------------  Chemistries  Recent Labs  Lab 05/31/23 2104  NA 127*  K 3.9  CL 91*  CO2 23  GLUCOSE 466*  BUN 21  CREATININE 1.02*  CALCIUM 9.0  AST 19  ALT 12  ALKPHOS 64  BILITOT 0.7   ------------------------------------------------------------------------------------------------------------------  Cardiac Enzymes No results for input(s): "TROPONINI" in the last 168 hours. ------------------------------------------------------------------------------------------------------------------  RADIOLOGY:  Chest Portable 1 View  Result Date: 05/31/2023 CLINICAL DATA:  Preop chest radiograph. EXAM: PORTABLE CHEST 1 VIEW COMPARISON:  Chest radiograph dated 11/05/2021. FINDINGS: The lungs are clear. There is no pleural effusion or pneumothorax. The cardiac silhouette is within normal limits. Median sternotomy wires and CABG vascular clips. No acute osseous pathology. IMPRESSION: No active disease. Electronically Signed   By: Elgie Collard M.D.   On: 05/31/2023 23:43   DG Hip Unilat W or Wo Pelvis 2-3 Views Left  Result Date: 05/31/2023 CLINICAL DATA:  Fall EXAM: DG HIP (WITH OR WITHOUT PELVIS) 2-3V LEFT COMPARISON:  CT 08/23/2021 FINDINGS: SI joints are non widened. Pubic symphysis and rami appear intact. Right hip replacement with intact hardware and normal alignment. Possible dystrophic calcification inferior to the right ischium. Acute left femoral neck fracture with apex anterior and lateral angulation. No femoral head dislocation IMPRESSION: Acute left femoral neck  fracture. Electronically Signed   By: Jasmine Pang M.D.   On: 05/31/2023 21:00  IMPRESSION AND PLAN:  Assessment and Plan: * Closed left hip fracture [facility]) - The patient will be admitted to a medical-surgical bed. - Pain management will be provided. - Orthopedic consult will be obtained. - Dr. Martha Clan was notified about the patient. - The patient has a history of CVA and diabetes mellitus but not on insulin with no history of CHF, coronary artery disease or renal failure with a creatinine more than 2.  She is considered above average risk for her age for perioperative cardiovascular events, per the revised cardiac risk index.  She has no current pulmonary issues.  Uncontrolled type 2 diabetes mellitus with hyperglycemia, without long-term current use of insulin (HCC) - The patient will be placed on supplement coverage with NovoLog.- - Her hyperglycemia is likely secondary to noncompliance. - We will hold off metformin. - We will continue glyburide and Jardiance.  Hypertensive urgency - This could be related to her pain and partly noncompliance. - We will continue her antihypertensives. - We will place on as needed IV labetalol.  Hyponatremia - This is likely pseudohyponatremia due to hyperglycemia. - We will monitor sodium level with hydration.  Dyslipidemia - We will continue statin therapy and should provide perioperative cardiovascular risk reduction.  Anxiety and depression - We will continue Xanax and Zoloft.   DVT prophylaxis: Lovenox.  Advanced Care Planning:  Code Status: full code.  Family Communication:  The plan of care was discussed in details with the patient (and family). I answered all questions. The patient agreed to proceed with the above mentioned plan. Further management will depend upon hospital course. Disposition Plan: Back to previous home environment Consults called: none.  All the records are reviewed and case discussed with ED  provider.  Status is: Inpatient.  At the time of the admission, it appears that the appropriate admission status for this patient is inpatient.  This is judged to be reasonable and necessary in order to provide the required intensity of service to ensure the patient's safety given the presenting symptoms, physical exam findings and initial radiographic and laboratory data in the context of comorbid conditions.  The patient requires inpatient status due to high intensity of service, high risk of further deterioration and high frequency of surveillance required.  I certify that at the time of admission, it is my clinical judgment that the patient will require inpatient hospital care extending more than 2 midnights.                            Dispo: The patient is from: Home              Anticipated d/c is to: Home              Patient currently is not medically stable to d/c.              Difficult to place patient: No  Hannah Beat M.D on 06/01/2023 at 1:22 AM  [facility]   From 7 PM-7 AM, contact night-coverage [email or web address]  CC: Primary care physician; Malva Limes, MD

## 2023-05-31 NOTE — ED Triage Notes (Signed)
Pt arrives via EMS w/c/o of left hip pain after a pts foot got caught at on a rug. Pt denies any blood thinners, or LOC

## 2023-05-31 NOTE — Assessment & Plan Note (Signed)
-   continue statin therapy. 

## 2023-05-31 NOTE — Assessment & Plan Note (Signed)
-   The patient will be admitted to a medical-surgical bed. - Pain management will be provided. - Orthopedic consult will be obtained. - Dr. Martha Clan was notified about the patient.

## 2023-05-31 NOTE — Assessment & Plan Note (Addendum)
-   This could be related to her pain and partly noncompliance. - Continue amlodipine and Cozaar, as needed IV labetalol for better blood pressure control.

## 2023-05-31 NOTE — ED Provider Notes (Signed)
[facility] Emergency Department Provider Note     Event Date/Time   First MD Initiated Contact with Patient 05/31/23 1955     (approximate)   History   Fall   HPI  [name] is a 67 y.o. female with a history of hypertension, diabetes, and stroke with left-sided weakness presents to the ED via EMS with complaint of left hip pain following a mechanical fall at work.  Patient reports she caught her foot on a rug and fell onto wooden floor making direct contact onto left hip.  Patient has a history of CVA that left patient with left-sided deficits.  She reports numbness but nothing out of her baseline.  Patient localized her pain to her left hip with radiation into left groin.   Patient denies head injury, LOC, headache, or being on blood thinners.    Physical Exam   Triage Vital Signs: ED Triage Vitals  Enc Vitals Group     BP 05/31/23 1952 (!) 221/83     Pulse Rate 05/31/23 1952 81     Resp 05/31/23 1952 18     Temp 05/31/23 1952 98.3 F (36.8 C)     Temp Source 05/31/23 1952 Oral     SpO2 05/31/23 1952 95 %     Weight 05/31/23 1955 175 lb (79.4 kg)     Height 05/31/23 1955 5\' 7"  (1.702 m)     Head Circumference --      Peak Flow --      Pain Score --      Pain Loc --      Pain Edu? --      Excl. in GC? --     Most recent vital signs: Vitals:   05/31/23 2200 05/31/23 2330  BP: (!) 190/91 (!) 182/87  Pulse: 90 87  Resp: 16   Temp:    SpO2: 96% 93%    General Awake, no distress.  HEENT NCAT. PERRL. EOMI. No rhinorrhea. Mucous membranes are moist. **} CV:  Good peripheral perfusion.  RESP:  Normal effort. RRR ABD:  No distention. LCTAB. Other:  Left hip reveals no ecchymosis, edema.  Moderate tenderness to palpation.  Patient is unable to bear weight.  Limited active and passive range of motion of left lower extremity due to pain.  Neurovascular status intact.   ED Results / Procedures / Treatments   Labs (all labs ordered  are listed, but only abnormal results are displayed) Labs Reviewed  COMPREHENSIVE METABOLIC PANEL - Abnormal; Notable for the following components:      Result Value   Sodium 127 (*)    Chloride 91 (*)    Glucose, Bld 466 (*)    Creatinine, Ser 1.02 (*)    Total Protein 8.9 (*)    GFR, Estimated 60 (*)    All other components within normal limits  CBC WITH DIFFERENTIAL/PLATELET - Abnormal; Notable for the following components:   RBC 5.32 (*)    Hemoglobin 15.2 (*)    Abs Immature Granulocytes 0.11 (*)    All other components within normal limits  HEMOGLOBIN A1C  HIV ANTIBODY (ROUTINE TESTING W REFLEX)   RADIOLOGY  I personally viewed and evaluated these images as part of my medical decision making, as well as reviewing the written report by the radiologist.  ED Provider Interpretation: Imaging reveals acute bony abnormality to left femoral neck.  Chest Portable 1 View  Result Date: 05/31/2023 CLINICAL DATA:  Preop chest radiograph. EXAM: PORTABLE  CHEST 1 VIEW COMPARISON:  Chest radiograph dated 11/05/2021. FINDINGS: The lungs are clear. There is no pleural effusion or pneumothorax. The cardiac silhouette is within normal limits. Median sternotomy wires and CABG vascular clips. No acute osseous pathology. IMPRESSION: No active disease. Electronically Signed   By: Elgie Collard M.D.   On: 05/31/2023 23:43   DG Hip Unilat W or Wo Pelvis 2-3 Views Left  Result Date: 05/31/2023 CLINICAL DATA:  Fall EXAM: DG HIP (WITH OR WITHOUT PELVIS) 2-3V LEFT COMPARISON:  CT 08/23/2021 FINDINGS: SI joints are non widened. Pubic symphysis and rami appear intact. Right hip replacement with intact hardware and normal alignment. Possible dystrophic calcification inferior to the right ischium. Acute left femoral neck fracture with apex anterior and lateral angulation. No femoral head dislocation IMPRESSION: Acute left femoral neck fracture. Electronically Signed   By: Jasmine Pang M.D.   On: 05/31/2023  21:00    PROCEDURES:  Critical Care performed: No  Procedures  MEDICATIONS ORDERED IN ED: Medications  amLODipine (NORVASC) tablet 10 mg (10 mg Oral Given 06/01/23 0020)  losartan (COZAAR) tablet 100 mg (100 mg Oral Given 06/01/23 0020)  rosuvastatin (CRESTOR) tablet 40 mg (has no administration in time range)  ALPRAZolam (XANAX) tablet 0.5 mg (0.5 mg Oral Given 06/01/23 0138)  sertraline (ZOLOFT) tablet 25 mg (has no administration in time range)  empagliflozin (JARDIANCE) tablet 10 mg (has no administration in time range)  glyBURIDE (DIABETA) tablet 10 mg (has no administration in time range)  oxybutynin (DITROPAN-XL) 24 hr tablet 10 mg (has no administration in time range)  multivitamin with minerals tablet 1 tablet (has no administration in time range)  montelukast (SINGULAIR) tablet 10 mg (has no administration in time range)  latanoprost (XALATAN) 0.005 % ophthalmic solution 1 drop (has no administration in time range)  HYDROcodone-acetaminophen (NORCO/VICODIN) 5-325 MG per tablet 1-2 tablet (1 tablet Oral Given 06/01/23 0023)  morphine (PF) 2 MG/ML injection 0.5 mg (0.5 mg Intravenous Given 06/01/23 0138)  0.9 %  sodium chloride infusion ( Intravenous New Bag/Given 06/01/23 0139)  magnesium hydroxide (MILK OF MAGNESIA) suspension 30 mL (has no administration in time range)  acetaminophen (TYLENOL) tablet 650 mg (has no administration in time range)  ondansetron (ZOFRAN) injection 4 mg (has no administration in time range)  traZODone (DESYREL) tablet 25 mg (25 mg Oral Given 06/01/23 0020)  insulin aspart (novoLOG) injection 0-9 Units (has no administration in time range)  morphine (PF) 2 MG/ML injection 0.5 mg (0.5 mg Intravenous Given 05/31/23 2101)    IMPRESSION / MDM / ASSESSMENT AND PLAN / ED COURSE  I reviewed the triage vital signs and the nursing notes.                             Clinical Course as of 06/01/23 0226  Mon May 31, 2023  2209 Orthopedic consult obtained.   Recommended admission and follow-up. [MH]  2243 I spoke with hospitalist Dr. Mertie Moores.  Patient will be admitted [MH]    Clinical Course User Index [MH] Kern Reap A, PA-C   67 y.o. female presents to the emergency department for evaluation and treatment of acute left hip pain following a mechanical fall. See HPI for further details.   Differential diagnosis includes, but is not limited to hip fracture, dislocation, fracture.  Given mechanism of injury a x-ray of the left hip was obtained revealing acute fracture of the femoral neck. The patient was administered morphine injection pain.  Further workup initiated for admission given patient is unable to bear weight on affected limb. Orthopedic consult obtained by Dr. Martha Clan recommending admission and follow-up. I spoke with Dr. Arville Care hospitalist who will be assuming care of patient.    Patient's presentation is most consistent with acute complicated illness / injury requiring diagnostic workup.  FINAL CLINICAL IMPRESSION(S) / ED DIAGNOSES   Final diagnoses:  Closed fracture of left hip, initial encounter [facility])     Rx / DC Orders   ED Discharge Orders     None        Note:  This document was prepared using Dragon voice recognition software and may include unintentional dictation errors.    Romeo Apple, Abbegayle Denault A, PA-C 06/01/23 0226    Georga Hacking, MD 06/01/23 [phone]

## 2023-05-31 NOTE — Assessment & Plan Note (Signed)
-   We will continue Xanax and Zoloft. 

## 2023-05-31 NOTE — ED Notes (Signed)
Pt taken off the bed pan after pt voided. Pt has call bell within reach. No other needs at this time.

## 2023-05-31 NOTE — Assessment & Plan Note (Signed)
>>  ASSESSMENT AND PLAN FOR DYSLIPIDEMIA WRITTEN ON 06/01/2023  4:02 PM BY SHAH, VIPUL, MD  - continue statin therapy

## 2023-06-01 ENCOUNTER — Encounter: Payer: Self-pay | Admitting: Family Medicine

## 2023-06-01 DIAGNOSIS — S72002A Fracture of unspecified part of neck of left femur, initial encounter for closed fracture: Secondary | ICD-10-CM | POA: Diagnosis not present

## 2023-06-01 DIAGNOSIS — E871 Hypo-osmolality and hyponatremia: Secondary | ICD-10-CM

## 2023-06-01 DIAGNOSIS — I160 Hypertensive urgency: Secondary | ICD-10-CM | POA: Diagnosis not present

## 2023-06-01 DIAGNOSIS — E1165 Type 2 diabetes mellitus with hyperglycemia: Secondary | ICD-10-CM | POA: Diagnosis not present

## 2023-06-01 LAB — TYPE AND SCREEN
ABO/RH(D): O POS
Antibody Screen: NEGATIVE

## 2023-06-01 LAB — BASIC METABOLIC PANEL
Anion gap: 10 (ref 5–15)
BUN: 20 mg/dL (ref 8–23)
CO2: 26 mmol/L (ref 22–32)
Calcium: 8.7 mg/dL — ABNORMAL LOW (ref 8.9–10.3)
Chloride: 98 mmol/L (ref 98–111)
Creatinine, Ser: 0.86 mg/dL (ref 0.44–1.00)
GFR, Estimated: >60 mL/min (ref >60)
Glucose, Bld: 201 mg/dL — ABNORMAL HIGH (ref 70–99)
Potassium: 3.7 mmol/L (ref 3.5–5.1)
Sodium: 133 mmol/L — ABNORMAL LOW (ref 135–145)

## 2023-06-01 LAB — HIV ANTIBODY (ROUTINE TESTING W REFLEX): HIV Screen 4th Generation wRfx: NONREACTIVE

## 2023-06-01 LAB — CBG MONITORING, ED
Glucose-Capillary: 377 mg/dL — ABNORMAL HIGH (ref 70–99)
Glucose-Capillary: 395 mg/dL — ABNORMAL HIGH (ref 70–99)
Glucose-Capillary: 226 mg/dL — ABNORMAL HIGH (ref 70–99)

## 2023-06-01 LAB — PROTIME-INR
INR: 1.1 (ref 0.8–1.2)
Prothrombin Time: 14.3 seconds (ref 11.4–15.2)

## 2023-06-01 LAB — HEMOGLOBIN A1C
Hgb A1c MFr Bld: 11 % — ABNORMAL HIGH (ref 4.8–5.6)
Mean Plasma Glucose: 269 mg/dL

## 2023-06-01 LAB — GLUCOSE, CAPILLARY
Glucose-Capillary: 204 mg/dL — ABNORMAL HIGH (ref 70–99)
Glucose-Capillary: 178 mg/dL — ABNORMAL HIGH (ref 70–99)
Glucose-Capillary: 168 mg/dL — ABNORMAL HIGH (ref 70–99)

## 2023-06-01 LAB — SURGICAL PCR SCREEN
MRSA, PCR: NEGATIVE
Staphylococcus aureus: NEGATIVE

## 2023-06-01 LAB — APTT: aPTT: 28 seconds (ref 24–36)

## 2023-06-01 MED ORDER — GLUCERNA SHAKE PO LIQD
237.0000 mL | Freq: Two times a day (BID) | ORAL | Status: DC
  Administered 2023-06-03 – 2023-06-11 (×12): 237 mL via ORAL

## 2023-06-01 MED ORDER — ENOXAPARIN SODIUM 40 MG/0.4ML IJ SOSY
40.0000 mg | PREFILLED_SYRINGE | Freq: Once | INTRAMUSCULAR | Status: AC
  Administered 2023-06-01: 40 mg via SUBCUTANEOUS
  Filled 2023-06-01: qty 0.4

## 2023-06-01 MED ORDER — INSULIN ASPART 100 UNIT/ML IJ SOLN: 3.0000 [IU] | Freq: Three times a day (TID) | INTRAMUSCULAR | Status: DC

## 2023-06-01 MED ORDER — CEFAZOLIN SODIUM-DEXTROSE 2-4 GM/100ML-% IV SOLN
2.0000 g | INTRAVENOUS | Status: AC
  Administered 2023-06-02: 2 g via INTRAVENOUS

## 2023-06-01 MED ORDER — LABETALOL HCL 5 MG/ML IV SOLN: 5.0000 mg | INTRAVENOUS | Status: DC | PRN

## 2023-06-01 MED ORDER — INSULIN GLARGINE-YFGN 100 UNIT/ML ~~LOC~~ SOLN
15.0000 [IU] | Freq: Every day | SUBCUTANEOUS | Status: DC
  Administered 2023-06-01 – 2023-06-06 (×5): 15 [IU] via SUBCUTANEOUS
  Filled 2023-06-01 (×6): qty 0.15

## 2023-06-01 NOTE — Progress Notes (Signed)
  Progress Note   Patient: [name] UJW:[id number] DOB: 23-Apr-1956 DOA: 05/31/2023     1 DOS: the patient was seen and examined on 06/01/2023   Brief hospital course: 67 y.o. female with medical history significant for disease status post four-vessel CABG, type 2 diabetes mellitus, hypertension, CVA, dyslipidemia, GERD, depression and anxiety admitted for left femoral neck fracture status post fall  6/18: Ortho consult, plan for surgery tomorrow  Assessment and Plan: * Closed left hip fracture (HCC) - Left hip hemiarthroplasty planned for tomorrow around noon per orthopedic Dr. Martha Clan - Pain management as ordered - She is considered above average risk for her age for perioperative cardiovascular events, per the revised cardiac risk index.  She has no current pulmonary issues.  Uncontrolled type 2 diabetes mellitus with hyperglycemia, without long-term current use of insulin (HCC) - Diabetic nurse consult - Continue sliding scale and insulin Semglee for better blood sugar control - hold off metformin and glyburide - Continue Jardiance. Hemoglobin A1c 11 suggestive of poor blood sugar control  Hypertensive urgency - This could be related to her pain and partly noncompliance. - Continue amlodipine and Cozaar, as needed IV labetalol for better blood pressure control.  Hyponatremia - likely pseudohyponatremia due to hyperglycemia. - monitor sodium level with hydration.  Dyslipidemia - continue statin therapy   Anxiety and depression - continue Xanax and Zoloft.  As needed trazodone for sleep        Subjective: Seen in the ED.  Sleepy and reports pain at the fracture site  Physical Exam: Vitals:   06/01/23 1130 06/01/23 1300 06/01/23 1338 06/01/23 1503  BP: 112/63 137/66  (!) 157/76  Pulse: 66 76  71  Resp:   20 17  Temp:    98.2 F (36.8 C)  TempSrc:    Oral  SpO2: 98% 95%  93%  Weight:      Height:       67 year old female lying in the bed comfortably  without any acute distress Lungs clear to auscultation bilaterally Heart regular rate and rhythm Abdomen soft, benign Neuro alert and awake, nonfocal Extremities: Left lower extremity shortened and externally rotated Psych normal affect Data Reviewed:  There are no new results to review at this time.  Family Communication: None at bedside  Disposition: Status is: Inpatient Remains inpatient appropriate because: Management of left hip/femoral neck fracture  Planned Discharge Destination: Skilled nursing facility   DVT prophylaxis-Lovenox (1 dose given today.  Hold after that for planned surgery tomorrow) Time spent: 35 minutes  Author: Delfino Lovett, MD 06/01/2023 4:07 PM  For on call review [email or web address].

## 2023-06-01 NOTE — Progress Notes (Signed)
PREOPERATIVE H&P  Chief Complaint: Left hip fracture  HPI: [name] is a 67 y.o. female who presents to the ER after a fall.  Patient is reported to have slipped while going to the bathroom at work and fell sustaining an injury to the left hip.  She has a history of a stroke which has left her with left sided weakness. She does ambulate at baseline without assist device.  Patient has a history of a right total hip arthroplasty.  She also has a history of a four-vessel CABG, type 2 diabetes with a hemoglobin A1c of 11 and a blood sugar of 466 in the ER.  Patient is admitted to the hospital service and orthopedics is consulted for management of her left femoral neck hip fracture confirmed on x-ray upon arrival to the ER.  Patient seen in the ER this morning and denies significant pain in the left hip.  She states she recently received medication for her pain.  Past Medical History:  Diagnosis Date   Anxiety    Arthritis    CAD (coronary artery disease)    a. 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 s/p CABG x4 LIMA to LAD, SVG to PDA, and Sequential SVG to OM and Diagonal on 8/   Stroke (HCC) 04/2021   admitted at [facility] - left sided weakness   Urinary incontinence    as stated by patient   Past Surgical History:  Procedure Laterality Date   AORTIC VALVE REPLACEMENT N/A 08/04/2021   Procedure: RESECTION AORTIC VALVE TUMOR;  Surgeon: Loreli Slot,  MD;  Location: [facility] OR;  Service: Open Heart Surgery;  Laterality: N/A;   APPLICATION OF WOUND VAC N/A 08/24/2021   Procedure: APPLICATION OF WOUND VAC;  Surgeon: Alleen Borne, MD;  Location: [facility] OR;  Service: Thoracic;  Laterality: N/A;   APPLICATION OF WOUND VAC N/A 08/28/2021   Procedure: WOUND VAC CHANGE;  Surgeon: Loreli Slot, MD;  Location: [facility] OR;  Service: Thoracic;  Laterality: N/A;   BREAST EXCISIONAL BIOPSY Right 1999   neg   BUBBLE STUDY  04/24/2021   Procedure: BUBBLE STUDY;  Surgeon: Quintella Reichert, MD;  Location: [facility] ENDOSCOPY;  Service: Cardiovascular;;   CATARACT EXTRACTION  12/2011   CATARACT EXTRACTION W/PHACO Left 12/02/2016   Procedure: CATARACT EXTRACTION PHACO AND INTRAOCULAR LENS PLACEMENT (IOC);  Surgeon: Sallee Lange, MD;  Location: [facility] ORS;  Service: Ophthalmology;  Laterality: Left;  [place] 2:14 AP% 28.4 CDE 59.73 Fluid pack lot # [id number]   COLONOSCOPY WITH PROPOFOL N/A 04/13/2022   Procedure: COLONOSCOPY WITH PROPOFOL;  Surgeon: Toney Reil, MD;  Location: [facility] ENDOSCOPY;  Service: Gastroenterology;  Laterality: N/A;   CORONARY ARTERY BYPASS GRAFT N/A 08/04/2021   Procedure: CORONARY ARTERY BYPASS GRAFTING (CABG) X  4  USING LEFT INTERNAL  MAMMARY ARTERY AND RIGHT GREATER SAPHENOUS VEIN ENDOSCOPIC CONDUITS;  Surgeon: Loreli Slot, MD;  Location: [facility] OR;  Service: Open Heart Surgery;  Laterality: N/A;   DIAGNOSTIC LAPAROSCOPY     EYE SURGERY     IR CT HEAD LTD  04/19/2021   IR PERCUTANEOUS ART THROMBECTOMY/INFUSION INTRACRANIAL INC DIAG ANGIO  04/19/2021       IR PERCUTANEOUS ART THROMBECTOMY/INFUSION INTRACRANIAL INC DIAG ANGIO  04/19/2021   IR US GUIDE VASC ACCESS LEFT  04/19/2021   RADIOLOGY WITH ANESTHESIA N/A 04/19/2021   Procedure: IR WITH ANESTHESIA;  Surgeon: Julieanne Cotton, MD;  Location: [facility] OR;  Service: Radiology;  Laterality: N/A;   STERNAL WOUND DEBRIDEMENT N/A 08/24/2021   Procedure: STERNAL WOUND DRAINAGE AND  DEBRIDEMENT;  Surgeon: Alleen Borne, MD;  Location: [facility] OR;  Service: Thoracic;  Laterality: N/A;   STERNAL WOUND DEBRIDEMENT N/A 08/28/2021   Procedure: STERNAL WOUND DEBRIDEMENT;  Surgeon: Loreli Slot, MD;  Location: [facility] OR;  Service: Thoracic;  Laterality: N/A;   TEE WITHOUT CARDIOVERSION N/A 04/24/2021   Procedure: TRANSESOPHAGEAL ECHOCARDIOGRAM (TEE);  Surgeon: Quintella Reichert, MD;  Location: [facility] ENDOSCOPY;  Service: Cardiovascular;  Laterality: N/A;   TEE WITHOUT CARDIOVERSION N/A 08/04/2021   Procedure: TRANSESOPHAGEAL ECHOCARDIOGRAM (TEE);  Surgeon: Loreli Slot, MD;  Location: [facility] OR;  Service: Open Heart Surgery;  Laterality: N/A;   TOTAL HIP ARTHROPLASTY Right 04/04/2018   Procedure: TOTAL HIP ARTHROPLASTY;  Surgeon: Donato Heinz, MD;  Location: [facility] ORS;  Service: Orthopedics;  Laterality: Right;   Social History   Socioeconomic History   Marital status: Widowed    Spouse name: Not on file   Number of children: 2   Years of education: Not on file   Highest education level: 12th grade  Occupational History    Employer: [employer]  Tobacco Use   Smoking status: Former    Packs/day: 1.50    Years: 25.00    Additional pack years: 0.00    Total pack years: 37.50    Types: Cigarettes    Quit date: 03/24/2007    Years since quitting: 16.2   Smokeless tobacco: Never  Vaping Use   Vaping Use: Never used  Substance and Sexual Activity   Alcohol use: Not Currently   Drug use: No   Sexual activity: Not Currently    Birth control/protection: Post-menopausal  Other Topics Concern   Not on file  Social History Narrative   Not on file   Social Determinants of Health   Financial Resource Strain: Low Risk  (04/28/2023)   Overall Financial Resource Strain (CARDIA)    Difficulty of Paying Living Expenses: Not hard at all  Food Insecurity: No Food Insecurity (04/28/2023)   Hunger Vital Sign    Worried About Running Out of Food in the Last Year: Never true     Ran Out of Food in the Last Year: Never true  Transportation Needs: No Transportation Needs (04/28/2023)   PRAPARE - Administrator, Civil Service (Medical): No    Lack of Transportation (Non-Medical): No  Physical Activity: Inactive (04/28/2023)   Exercise Vital Sign    Days of Exercise per Week: 0 days    Minutes of Exercise per Session: 0 min  Stress: Stress Concern Present (04/28/2023)   [employer] of Occupational Health - Occupational Stress Questionnaire    Feeling of Stress : To some extent  Social Connections: Socially Isolated (04/28/2023)   Social Connection and Isolation Panel [NHANES]  Frequency of Communication with Friends and Family: More than three times a week    Frequency of Social Gatherings with Friends and Family: Once a week    Attends Religious Services: Never    Database administrator or Organizations: No    Attends Banker Meetings: Never    Marital Status: Widowed   Family History  Problem Relation Age of Onset   Bone cancer Brother    Aneurysm Mother        brain   Diabetes Father    CAD Father    Heart failure Father    Colon cancer Father    Cancer Father    Alzheimer's disease Paternal Grandmother    Dementia Paternal Grandmother    Mental illness Paternal Grandfather    Healthy Daughter    Healthy Son    Breast cancer Neg Hx    Allergies  Allergen Reactions   Jardiance [Empagliflozin] Itching    Recurrent mycotic infections   Penicillins Rash and Other (See Comments)    Has patient had a PCN reaction causing immediate rash, facial/tongue/throat swelling, SOB or lightheadedness with hypotension: No Has patient had a PCN reaction causing severe rash involving mucus membranes or skin necrosis: No Has patient had a PCN reaction that required hospitalization No Has patient had a PCN reaction occurring within the last 10 years: No If all of the above answers are "NO", then may proceed with Cephalosporin use.     Prior to Admission medications   Medication Sig Start Date End Date Taking? Authorizing Provider  acetaminophen (TYLENOL) 325 MG tablet Take 2 tablets (650 mg total) by mouth every 4 (four) hours as needed for mild pain (or temp > 37.5 C (99.5 F)). 04/29/21  Yes Olivencia-Simmons, Deforest Hoyles, NP  ALPRAZolam (XANAX) 0.5 MG tablet Take 1 tablet (0.5 mg total) by mouth 2 (two) times daily as needed for anxiety 11/27/22  Yes Raulkar, Drema Pry, MD  Blood Glucose Monitoring Suppl (BLOOD GLUCOSE MONITOR SYSTEM) w/Device KIT 1 each by Does not apply route in the morning, at noon, and at bedtime. 03/22/23  Yes Malva Limes, MD  Blood Glucose Monitoring Suppl (FREESTYLE FREEDOM LITE) w/Device KIT USE AS DIRECTED 11/21/21  Yes Malva Limes, MD  glucose blood (FREESTYLE LITE) test strip Also needs Lancets. Use to check blood sugar up to four times a day for insulin dependant diabetes 06/20/21  Yes Fisher, Demetrios Isaacs, MD  latanoprost (XALATAN) 0.005 % ophthalmic solution Place 1 drop into both eyes Nightly. 03/11/23  Yes   Multiple Vitamin (MULTIVITAMIN) tablet Take 1 tablet by mouth daily.   Yes [provider]  oxybutynin (DITROPAN-XL) 10 MG 24 hr tablet Take 1 tablet (10 mg total) by mouth daily. 02/01/23  Yes Vanna Scotland, MD  amLODipine (NORVASC) 10 MG tablet Take 1 tablet (10 mg total) by mouth daily. Patient not taking: Reported on 05/31/2023 12/05/21   Malva Limes, MD  aspirin 81 MG EC tablet Take 1 tablet (81 mg total) by mouth daily. Swallow whole. Patient not taking: Reported on 05/31/2023 03/09/22   Malva Limes, MD  ciprofloxacin (CILOXAN) 0.3 % ophthalmic solution Place 1 drop in affected eye every 4 hours while awake Patient not taking: Reported on 05/31/2023 02/10/23   Malva Limes, MD  empagliflozin (JARDIANCE) 10 MG TABS tablet Take 1 tablet (10 mg total) by mouth daily before breakfast. Patient not taking: Reported on 02/23/2023 10/17/22   Jacky Kindle, FNP  glyBURIDE  (DIABETA)  5 MG tablet Take 2 tablets (10 mg total) by mouth 2 (two) times daily with a meal. Patient not taking: Reported on 05/31/2023 10/17/22   Jacky Kindle, FNP  losartan (COZAAR) 100 MG tablet Take 1 tablet (100 mg total) by mouth daily. Patient not taking: Reported on 05/31/2023 08/25/22   Christell Constant, MD  metFORMIN (GLUCOPHAGE-XR) 500 MG 24 hr tablet Take 2 tablets (1,000 mg total) by mouth 2 (two) times daily with a meal. Patient not taking: Reported on 05/31/2023 10/17/22   Jacky Kindle, FNP  montelukast (SINGULAIR) 10 MG tablet Take 1 tablet (10 mg total) by mouth at bedtime. Patient not taking: Reported on 05/31/2023 10/16/22   Jacky Kindle, FNP  rosuvastatin (CRESTOR) 40 MG tablet Take 1 tablet (40 mg total) by mouth daily. Patient not taking: Reported on 05/31/2023 10/17/22   Merita Norton T, FNP  sertraline (ZOLOFT) 25 MG tablet Take 1 tablet by mouth at bedtime. Patient not taking: Reported on 05/31/2023 07/07/22   Horton Chin, MD  Vitamin D, Ergocalciferol, (DRISDOL) 1.25 MG (50000 UNIT) CAPS capsule Take 1 capsule (50,000 Units total) by mouth every 7 (seven) days. Patient not taking: Reported on 05/31/2023 10/17/22   Merita Norton T, FNP     Positive ROS: All other systems have been reviewed and were otherwise negative with the exception of those mentioned in the HPI and as above.  Physical Exam: General: Alert, no acute distress  MUSCULOSKELETAL: Left lower extremity: Patient's skin is intact.  There is no erythema ecchymosis or significant swelling.  Her thigh and leg compartments are soft compressible.  She has shortening and external rotation of the left lower extremity.  She has palpable pedal pulses, intact sensation light touch and can flex and extend her toes.  Assessment: Left closed, displaced femoral neck hip fracture  Plan: Plan for Procedure(s): Left hip hemiarthoplasty   I have personally reviewed the patient's x-rays showing a displaced and  angulated fracture of the left femoral neck.  There is no dislocation to the hip joint. I explained to the patient today in the ER the nature of her fracture.  I am recommending a left hip hemiarthroplasty for her fracture.  Patient will have her surgery tomorrow around 12:30 PM per the OR.  Patient will be n.p.o. after midnight tonight.  Patient may have a dose of Lovenox this morning but should avoid further anticoagulation.  I discussed the risks and benefits of surgery. The risks include but are not limited to infection, bleeding requiring blood transfusion, nerve or blood vessel injury, joint stiffness or loss of motion, persistent pain, weakness or instability, fracture, dislocation and the need for further surgery, including conversion to a left total hip arthroplasty. Patient understood these risks and wished to proceed.     Juanell Fairly, MD   06/01/2023 8:13 AM

## 2023-06-01 NOTE — Assessment & Plan Note (Addendum)
-   likely pseudohyponatremia due to hyperglycemia. - monitor sodium level with hydration.

## 2023-06-01 NOTE — Hospital Course (Signed)
67 y.o. female with medical history significant for disease status post four-vessel CABG, type 2 diabetes mellitus, hypertension, CVA, dyslipidemia, GERD, depression and anxiety admitted for left femoral neck fracture status post fall  6/18: Ortho consult, plan for surgery tomorrow

## 2023-06-01 NOTE — Inpatient Diabetes Management (Signed)
Inpatient Diabetes Program Recommendations  AACE/ADA: New Consensus Statement on Inpatient Glycemic Control (2015)  Target Ranges:  Prepandial:   less than 140 mg/dL      Peak postprandial:   less than 180 mg/dL (1-2 hours)      Critically ill patients:  140 - 180 mg/dL   Lab Results  Component Value Date   GLUCAP 395 (H) 06/01/2023   HGBA1C 11.0 (H) 05/31/2023    Latest Reference Range & Units 03/10/22 00:00 10/16/22 13:50 05/31/23 21:04  Hemoglobin A1C 4.8 - 5.6 % 8.1 (H) 10.4 (H) 11.0 (H)  (H): Data is abnormally high  Latest Reference Range & Units 06/01/23 06:49 06/01/23 08:46  Glucose-Capillary 70 - 99 mg/dL 161 (H) 096 (H) Novolog 10 units  (H): Data is abnormally high  Diabetes history: DM2 Outpatient Diabetes medications: Glyburide 10 mg bid, Metformin 1 gm bid, Jardiance 10 mg qd Current orders for Inpatient glycemic control: Glyburide 10 mg bid, Novolog 3 units tid meal coverage, Jardiance 10 mg, Novolog 0-9 units qid correction  Inpatient Diabetes Program Recommendations:   Please consider: -Add Semglee 15 units (0.2 units/kg x 79.4 kg = 15.88 units) now and every day while oral meds on hold -D/C Glyburide -D/C Novolog meal coverage this pm due to NPO tonight for surgery tomorrow -Add Carb mod to diet order  Thank you, Darel Hong E. Baruc Tugwell, RN, MSN, CDE  Diabetes Coordinator Inpatient Glycemic Control Team Team Pager [phone] (8am-5pm) 06/01/2023 9:15 AM

## 2023-06-01 NOTE — Progress Notes (Signed)
Initial Nutrition Assessment  DOCUMENTATION CODES:   Not applicable  INTERVENTION:   -Carb modified diet -MVI with minerals daily -Glucerna Shake po BID, each supplement provides 220 kcal and 10 grams of protein   NUTRITION DIAGNOSIS:   Increased nutrient needs related to post-op healing as evidenced by estimated needs.  GOAL:   Patient will meet greater than or equal to 90% of their needs  MONITOR:   PO intake, Supplement acceptance  REASON FOR ASSESSMENT:   Consult Assessment of nutrition requirement/status, Hip fracture protocol  ASSESSMENT:   Pt with medical history significant for disease status post four-vessel CABG, type 2 diabetes mellitus, hypertension, CVA, dyslipidemia, GERD, depression and anxiety, who presented with acute onset of accidental mechanical fall with subsequent left hip pain and inability to bear weight on it.  Pt admitted with closed lt hip fracture.   Pt unavailable at time of visit. Pt in with other providers at time of attempted visit. RD unable to obtain further nutrition-related history or complete nutrition-focused physical exam at this time.    Pt currently on a regular diet. RD will modify to carb modified secondary to history of uncontrolled DM (likely related to medication noncompliance).   Per orthopedics notes, plan for lt hip hemiarthroplasty tomorrow (06/02/23). Plan to be NPO after midnight.   Reviewed wt hx; pt has experienced a 1.1% wt loss over the past 6 months, which is not significant for time frame.   Medications reviewed and include jardiance and 0.9% sodium chloride infusion @ 100 ml/hr.   Lab Results  Component Value Date   HGBA1C 11.0 (H) 05/31/2023   PTA DM medications are 10 mg jardiance daily and 1000 gm metformin BID.   Labs reviewed: Na: 127, CBGS: 204 (inpatient orders for glycemic control are 0-9 units insulin aspart 4 times dauly and 15 units insulin glargine-yfgn daily).    Diet Order:   Diet Order              Diet NPO time specified Except for: Sips with Meds  Diet effective midnight           Diet regular Fluid consistency: Thin  Diet effective now                   EDUCATION NEEDS:   No education needs have been identified at this time  Skin:  Skin Assessment: Reviewed RN Assessment  Last BM:  Unknown  Height:   Ht Readings from Last 1 Encounters:  05/31/23 5\' 7"  (1.702 m)    Weight:   Wt Readings from Last 1 Encounters:  05/31/23 79.4 kg    Ideal Body Weight:  61.4 kg  BMI:  Body mass index is 27.41 kg/m.  Estimated Nutritional Needs:   Kcal:  1700-1900  Protein:  90-105 grams  Fluid:  > 1.7 L    Levada Schilling, RD, LDN, CDCES Registered Dietitian II Certified Diabetes Care and Education Specialist Please refer to [facility] for RD and/or RD on-call/weekend/after hours pager

## 2023-06-01 NOTE — Plan of Care (Signed)

## 2023-06-02 ENCOUNTER — Encounter
Admission: EM | Disposition: A | Discharge status: Skilled Nursing Facility | Payer: Self-pay | Source: Home / Self Care | Attending: Obstetrics and Gynecology

## 2023-06-02 ENCOUNTER — Other Ambulatory Visit: Payer: Self-pay

## 2023-06-02 ENCOUNTER — Inpatient Hospital Stay: Payer: PPO | Admitting: General Practice

## 2023-06-02 ENCOUNTER — Encounter: Payer: Self-pay | Admitting: Family Medicine

## 2023-06-02 ENCOUNTER — Inpatient Hospital Stay: Payer: PPO

## 2023-06-02 DIAGNOSIS — S72002A Fracture of unspecified part of neck of left femur, initial encounter for closed fracture: Secondary | ICD-10-CM | POA: Diagnosis not present

## 2023-06-02 DIAGNOSIS — S72032A Displaced midcervical fracture of left femur, initial encounter for closed fracture: Secondary | ICD-10-CM | POA: Diagnosis not present

## 2023-06-02 HISTORY — PX: HIP ARTHROPLASTY: SHX981

## 2023-06-02 LAB — CBC
HCT: 40.3 % (ref 36.0–46.0)
Hemoglobin: 13.4 g/dL (ref 12.0–15.0)
MCH: 28.1 pg (ref 26.0–34.0)
MCHC: 33.3 g/dL (ref 30.0–36.0)
MCV: 84.5 fL (ref 80.0–100.0)
Platelets: 257 10*3/uL (ref 150–400)
RBC: 4.77 MIL/uL (ref 3.87–5.11)
RDW: 12.5 % (ref 11.5–15.5)
WBC: 9 10*3/uL (ref 4.0–10.5)
nRBC: 0 % (ref 0.0–0.2)

## 2023-06-02 LAB — GLUCOSE, CAPILLARY
Glucose-Capillary: 201 mg/dL — ABNORMAL HIGH (ref 70–99)
Glucose-Capillary: 212 mg/dL — ABNORMAL HIGH (ref 70–99)
Glucose-Capillary: 150 mg/dL — ABNORMAL HIGH (ref 70–99)
Glucose-Capillary: 116 mg/dL — ABNORMAL HIGH (ref 70–99)
Glucose-Capillary: 132 mg/dL — ABNORMAL HIGH (ref 70–99)
Glucose-Capillary: 282 mg/dL — ABNORMAL HIGH (ref 70–99)

## 2023-06-02 SURGERY — HEMIARTHROPLASTY, HIP, DIRECT ANTERIOR APPROACH, FOR FRACTURE
Anesthesia: General | Site: Hip | Laterality: Left

## 2023-06-02 MED ORDER — FENTANYL CITRATE (PF) 100 MCG/2ML IJ SOLN
25.0000 ug | INTRAMUSCULAR | Status: DC | PRN
  Administered 2023-06-02 (×2): 25 ug via INTRAVENOUS

## 2023-06-02 MED ORDER — CHLORHEXIDINE GLUCONATE 0.12 % MT SOLN
OROMUCOSAL | Status: AC
  Filled 2023-06-02: qty 15

## 2023-06-02 MED ORDER — CEFAZOLIN SODIUM-DEXTROSE 2-4 GM/100ML-% IV SOLN
2.0000 g | Freq: Four times a day (QID) | INTRAVENOUS | Status: AC
  Administered 2023-06-02 – 2023-06-03 (×2): 2 g via INTRAVENOUS
  Filled 2023-06-02 (×2): qty 100

## 2023-06-02 MED ORDER — DROPERIDOL 2.5 MG/ML IJ SOLN: 0.6250 mg | Freq: Once | INTRAMUSCULAR | Status: DC | PRN

## 2023-06-02 MED ORDER — METHOCARBAMOL 1000 MG/10ML IJ SOLN
500.0000 mg | Freq: Four times a day (QID) | INTRAVENOUS | Status: DC | PRN
  Filled 2023-06-02: qty 5

## 2023-06-02 MED ORDER — ACETAMINOPHEN 10 MG/ML IV SOLN
INTRAVENOUS | Status: DC | PRN
  Administered 2023-06-02: 1000 mg via INTRAVENOUS

## 2023-06-02 MED ORDER — ACETAMINOPHEN 500 MG PO TABS
1000.0000 mg | ORAL_TABLET | Freq: Four times a day (QID) | ORAL | Status: AC
  Administered 2023-06-02 – 2023-06-03 (×3): 1000 mg via ORAL
  Filled 2023-06-02 (×4): qty 2

## 2023-06-02 MED ORDER — SUGAMMADEX SODIUM 200 MG/2ML IV SOLN
INTRAVENOUS | Status: DC | PRN
  Administered 2023-06-02: 200 mg via INTRAVENOUS

## 2023-06-02 MED ORDER — DEXAMETHASONE SODIUM PHOSPHATE 10 MG/ML IJ SOLN
INTRAMUSCULAR | Status: DC | PRN
  Administered 2023-06-02: 5 mg via INTRAVENOUS

## 2023-06-02 MED ORDER — EPHEDRINE SULFATE (PRESSORS) 50 MG/ML IJ SOLN
INTRAMUSCULAR | Status: DC | PRN
  Administered 2023-06-02: 5 mg via INTRAVENOUS

## 2023-06-02 MED ORDER — MIDAZOLAM HCL 2 MG/2ML IJ SOLN
INTRAMUSCULAR | Status: AC
  Filled 2023-06-02: qty 2

## 2023-06-02 MED ORDER — PROPOFOL 10 MG/ML IV BOLUS
INTRAVENOUS | Status: DC | PRN
  Administered 2023-06-02: 20 mg via INTRAVENOUS
  Administered 2023-06-02: 100 mg via INTRAVENOUS

## 2023-06-02 MED ORDER — SODIUM CHLORIDE 0.9 % IR SOLN
Status: DC | PRN
  Administered 2023-06-02: 3000 mL

## 2023-06-02 MED ORDER — ONDANSETRON HCL 4 MG PO TABS: 4.0000 mg | ORAL_TABLET | Freq: Four times a day (QID) | ORAL | Status: DC | PRN

## 2023-06-02 MED ORDER — FENTANYL CITRATE (PF) 100 MCG/2ML IJ SOLN
INTRAMUSCULAR | Status: DC | PRN
  Administered 2023-06-02 (×2): 50 ug via INTRAVENOUS

## 2023-06-02 MED ORDER — PHENYLEPHRINE 80 MCG/ML (10ML) SYRINGE FOR IV PUSH (FOR BLOOD PRESSURE SUPPORT)
PREFILLED_SYRINGE | INTRAVENOUS | Status: DC | PRN
  Administered 2023-06-02: 80 ug via INTRAVENOUS

## 2023-06-02 MED ORDER — POLYETHYLENE GLYCOL 3350 17 G PO PACK: 17.0000 g | PACK | Freq: Every day | ORAL | Status: DC | PRN

## 2023-06-02 MED ORDER — BISACODYL 10 MG RE SUPP: 10.0000 mg | Freq: Every day | RECTAL | Status: DC | PRN

## 2023-06-02 MED ORDER — ONDANSETRON HCL 4 MG/2ML IJ SOLN: 4.0000 mg | Freq: Four times a day (QID) | INTRAMUSCULAR | Status: DC | PRN

## 2023-06-02 MED ORDER — OXYCODONE HCL 5 MG PO TABS
5.0000 mg | ORAL_TABLET | ORAL | Status: DC | PRN
  Administered 2023-06-02: 5 mg via ORAL
  Administered 2023-06-03 (×3): 10 mg via ORAL
  Administered 2023-06-05 – 2023-06-06 (×3): 5 mg via ORAL
  Administered 2023-06-06: 10 mg via ORAL
  Administered 2023-06-06: 5 mg via ORAL
  Administered 2023-06-07 – 2023-06-11 (×5): 10 mg via ORAL
  Filled 2023-06-02: qty 2
  Filled 2023-06-02: qty 1
  Filled 2023-06-02: qty 2
  Filled 2023-06-02: qty 1
  Filled 2023-06-02 (×8): qty 2
  Filled 2023-06-02: qty 1
  Filled 2023-06-02 (×5): qty 2
  Filled 2023-06-02 (×2): qty 1

## 2023-06-02 MED ORDER — SENNA 8.6 MG PO TABS
1.0000 | ORAL_TABLET | Freq: Two times a day (BID) | ORAL | Status: DC
  Administered 2023-06-02 – 2023-06-11 (×17): 8.6 mg via ORAL
  Filled 2023-06-02 (×18): qty 1

## 2023-06-02 MED ORDER — METHOCARBAMOL 500 MG PO TABS
500.0000 mg | ORAL_TABLET | Freq: Four times a day (QID) | ORAL | Status: DC | PRN
  Administered 2023-06-03 – 2023-06-10 (×5): 500 mg via ORAL
  Filled 2023-06-02 (×6): qty 1

## 2023-06-02 MED ORDER — HYDROMORPHONE HCL 1 MG/ML IJ SOLN: 0.5000 mg | INTRAMUSCULAR | Status: DC | PRN

## 2023-06-02 MED ORDER — DEXAMETHASONE SODIUM PHOSPHATE 10 MG/ML IJ SOLN
INTRAMUSCULAR | Status: AC
  Filled 2023-06-02: qty 1

## 2023-06-02 MED ORDER — ACETAMINOPHEN 10 MG/ML IV SOLN
INTRAVENOUS | Status: AC
  Filled 2023-06-02: qty 100

## 2023-06-02 MED ORDER — 0.9 % SODIUM CHLORIDE (POUR BTL) OPTIME
TOPICAL | Status: DC | PRN
  Administered 2023-06-02: 500 mL

## 2023-06-02 MED ORDER — OXYCODONE HCL 5 MG PO TABS
10.0000 mg | ORAL_TABLET | ORAL | Status: DC | PRN
  Administered 2023-06-07: 10 mg via ORAL
  Administered 2023-06-07: 15 mg via ORAL
  Administered 2023-06-08: 10 mg via ORAL
  Administered 2023-06-08: 15 mg via ORAL
  Administered 2023-06-09 – 2023-06-11 (×7): 10 mg via ORAL
  Filled 2023-06-02: qty 2
  Filled 2023-06-02 (×2): qty 3
  Filled 2023-06-02 (×2): qty 2

## 2023-06-02 MED ORDER — ROCURONIUM BROMIDE 100 MG/10ML IV SOLN
INTRAVENOUS | Status: DC | PRN
  Administered 2023-06-02: 50 mg via INTRAVENOUS
  Administered 2023-06-02: 30 mg via INTRAVENOUS

## 2023-06-02 MED ORDER — ENOXAPARIN SODIUM 40 MG/0.4ML IJ SOSY
40.0000 mg | PREFILLED_SYRINGE | INTRAMUSCULAR | Status: DC
  Administered 2023-06-03 – 2023-06-11 (×9): 40 mg via SUBCUTANEOUS
  Filled 2023-06-02 (×9): qty 0.4

## 2023-06-02 MED ORDER — ONDANSETRON HCL 4 MG/2ML IJ SOLN
INTRAMUSCULAR | Status: AC
  Filled 2023-06-02: qty 2

## 2023-06-02 MED ORDER — PHENOL 1.4 % MT LIQD: 1.0000 | OROMUCOSAL | Status: DC | PRN

## 2023-06-02 MED ORDER — HYDROMORPHONE HCL 1 MG/ML IJ SOLN
1.0000 mg | Freq: Once | INTRAMUSCULAR | Status: AC
  Administered 2023-06-02: 1 mg via INTRAVENOUS
  Filled 2023-06-02: qty 1

## 2023-06-02 MED ORDER — FENTANYL CITRATE (PF) 100 MCG/2ML IJ SOLN
INTRAMUSCULAR | Status: AC
  Filled 2023-06-02: qty 2

## 2023-06-02 MED ORDER — MIDAZOLAM HCL 2 MG/2ML IJ SOLN
INTRAMUSCULAR | Status: DC | PRN
  Administered 2023-06-02: 2 mg via INTRAVENOUS

## 2023-06-02 MED ORDER — ONDANSETRON HCL 4 MG/2ML IJ SOLN
INTRAMUSCULAR | Status: DC | PRN
  Administered 2023-06-02: 4 mg via INTRAVENOUS

## 2023-06-02 MED ORDER — PHENYLEPHRINE HCL-NACL 20-0.9 MG/250ML-% IV SOLN
INTRAVENOUS | Status: DC | PRN
  Administered 2023-06-02: 80 ug/min via INTRAVENOUS
  Administered 2023-06-02: 40 ug/min via INTRAVENOUS

## 2023-06-02 MED ORDER — MENTHOL 3 MG MT LOZG: 1.0000 | LOZENGE | OROMUCOSAL | Status: DC | PRN

## 2023-06-02 MED ORDER — TRAMADOL HCL 50 MG PO TABS
50.0000 mg | ORAL_TABLET | Freq: Four times a day (QID) | ORAL | Status: DC
  Administered 2023-06-02 – 2023-06-04 (×7): 50 mg via ORAL
  Filled 2023-06-02 (×7): qty 1

## 2023-06-02 MED ORDER — DOCUSATE SODIUM 100 MG PO CAPS
100.0000 mg | ORAL_CAPSULE | Freq: Two times a day (BID) | ORAL | Status: DC
  Administered 2023-06-02 – 2023-06-11 (×17): 100 mg via ORAL
  Filled 2023-06-02 (×18): qty 1

## 2023-06-02 SURGICAL SUPPLY — 59 items
BLADE SAGITTAL WIDE XTHICK NO (BLADE) ×1 IMPLANT
BLADE SURG SZ10 CARB STEEL (BLADE) ×1 IMPLANT
BNDG CMPR 5X4 CHSV STRCH STRL (GAUZE/BANDAGES/DRESSINGS) ×1
BNDG COHESIVE 4X5 TAN STRL LF (GAUZE/BANDAGES/DRESSINGS) ×1 IMPLANT
COVER BACK TABLE REUSABLE LG (DRAPES) ×1 IMPLANT
DRAPE 3/4 80X56 (DRAPES) ×2 IMPLANT
DRAPE INCISE IOBAN 66X60 STRL (DRAPES) ×1 IMPLANT
DRAPE ORTHO SPLIT 77X108 STRL (DRAPES) ×2
DRAPE SHEET LG 3/4 BI-LAMINATE (DRAPES) IMPLANT
DRAPE SURG 17X11 SM STRL (DRAPES) ×1 IMPLANT
DRAPE SURG ORHT 6 SPLT 77X108 (DRAPES) ×2 IMPLANT
DRAPE U-SHAPE 47X51 STRL (DRAPES) ×1 IMPLANT
DRSG AQUACEL AG ADV 3.5X10 (GAUZE/BANDAGES/DRESSINGS) ×1 IMPLANT
DURAPREP 26ML APPLICATOR (WOUND CARE) ×4 IMPLANT
ELECT CAUTERY BLADE 6.4 (BLADE) ×1 IMPLANT
ELECT REM PT RETURN 9FT ADLT (ELECTROSURGICAL) ×1
ELECTRODE REM PT RTRN 9FT ADLT (ELECTROSURGICAL) ×1 IMPLANT
GAUZE 4X4 16PLY ~~LOC~~+RFID DBL (SPONGE) ×1 IMPLANT
GAUZE XEROFORM 1X8 LF (GAUZE/BANDAGES/DRESSINGS) ×2 IMPLANT
GLOVE BIOGEL PI IND STRL 9 (GLOVE) ×1 IMPLANT
GLOVE BIOGEL PI ORTHO SZ9 (GLOVE) ×4 IMPLANT
GOWN STRL REUS TWL 2XL XL LVL4 (GOWN DISPOSABLE) ×1 IMPLANT
GOWN STRL REUS W/ TWL LRG LVL3 (GOWN DISPOSABLE) ×1 IMPLANT
GOWN STRL REUS W/TWL LRG LVL3 (GOWN DISPOSABLE) ×1
HANDLE YANKAUER SUCT OPEN TIP (MISCELLANEOUS) IMPLANT
HEAD MODULAR ENDO (Orthopedic Implant) ×1 IMPLANT
HEAD UNPLR 47XMDLR STRL HIP (Orthopedic Implant) IMPLANT
HOLDER FOLEY CATH W/STRAP (MISCELLANEOUS) ×1 IMPLANT
HOLSTER ELECTROSUGICAL PENCIL (MISCELLANEOUS) ×1 IMPLANT
IV NS IRRIG 3000ML ARTHROMATIC (IV SOLUTION) ×1 IMPLANT
KIT TURNOVER KIT A (KITS) ×1 IMPLANT
MANIFOLD NEPTUNE II (INSTRUMENTS) ×1 IMPLANT
NDL FILTER BLUNT 18X1 1/2 (NEEDLE) ×1 IMPLANT
NDL MAYO CATGUT SZ4 TPR NDL (NEEDLE) ×1 IMPLANT
NDL SAFETY ECLIP 18X1.5 (MISCELLANEOUS) ×1 IMPLANT
NEEDLE FILTER BLUNT 18X1 1/2 (NEEDLE) ×1 IMPLANT
NEEDLE MAYO CATGUT SZ4 (NEEDLE) ×1 IMPLANT
NS IRRIG 1000ML POUR BTL (IV SOLUTION) ×1 IMPLANT
PACK HIP PROSTHESIS (MISCELLANEOUS) ×1 IMPLANT
PENCIL SMOKE EVACUATOR COATED (MISCELLANEOUS) IMPLANT
PILLOW ABDUCTION FOAM SM (MISCELLANEOUS) ×1 IMPLANT
PULSAVAC PLUS IRRIG FAN TIP (DISPOSABLE) ×1
RETRIEVER SUT HEWSON (MISCELLANEOUS) IMPLANT
SLEEVE UNITRAX V40 (Orthopedic Implant) ×1 IMPLANT
SLEEVE UNITRAX V40 +12 (Orthopedic Implant) IMPLANT
SPONGE T-LAP 18X18 ~~LOC~~+RFID (SPONGE) ×1 IMPLANT
STAPLER SKIN PROX 35W (STAPLE) ×1 IMPLANT
STEM ACCOLADE II SZ4 (Stem) IMPLANT
SUT TICRON 2-0 30IN 311381 (SUTURE) ×4 IMPLANT
SUT VIC AB 0 CT1 36 (SUTURE) ×1 IMPLANT
SUT VIC AB 2-0 CT2 27 (SUTURE) ×2 IMPLANT
SYR 10ML LL (SYRINGE) ×1 IMPLANT
TIP BRUSH PULSAVAC PLUS 24.33 (MISCELLANEOUS) ×1 IMPLANT
TIP FAN IRRIG PULSAVAC PLUS (DISPOSABLE) ×1 IMPLANT
TRAP FLUID SMOKE EVACUATOR (MISCELLANEOUS) ×1 IMPLANT
TRAY FOLEY SLVR 16FR LF STAT (SET/KITS/TRAYS/PACK) ×1 IMPLANT
TUBE SUCT KAM VAC (TUBING) IMPLANT
TUBING CONNECTING 10 (TUBING) IMPLANT
WATER STERILE IRR 500ML POUR (IV SOLUTION) ×1 IMPLANT

## 2023-06-02 NOTE — Anesthesia Preprocedure Evaluation (Addendum)
Anesthesia Evaluation  Patient identified by MRN, date of birth, ID band Patient awake    Reviewed: Allergy & Precautions, NPO status , Patient's Chart, lab work & pertinent test results  History of Anesthesia Complications Negative for: history of anesthetic complications  Airway Mallampati: III  TM Distance: >3 FB Neck ROM: Full    Dental  (+) Partial Lower, Poor Dentition, Dental Advidsory Given   Pulmonary neg shortness of breath, neg sleep apnea, neg COPD, neg recent URI, Patient abstained from smoking.Not current smoker, former smoker   Pulmonary exam normal breath sounds clear to auscultation       Cardiovascular Exercise Tolerance: Poor METS: < 3 Mets hypertension, (-) angina + CAD and + CABG  (-) Past MI (-) dysrhythmias  Rhythm:Regular Rate:Normal - Systolic murmurs S/p CABG and aortic valve repair. Doing well since; she is limited in her exercise tolerance b y residual left sided weakness from stroke   Neuro/Psych  PSYCHIATRIC DISORDERS Anxiety Depression    Residual left sided symptoms CVA, Residual Symptoms    GI/Hepatic ,GERD  ,,(+)     (-) substance abuse    Endo/Other  diabetes    Renal/GU negative Renal ROS     Musculoskeletal  (+) Arthritis ,    Abdominal   Peds  Hematology   Anesthesia Other Findings Past Medical History: No date: Anxiety No date: Arthritis No date: CAD (coronary artery disease)     Comment:  a. 3 vessel CAD noted on coronary CTA 04/2021, s/p CABG               x4 No date: Cataract     Comment:  left No date: Depression No date: Diabetes mellitus without complication (HCC)     Comment:  type 2 No date: Environmental and seasonal allergies No date: GERD (gastroesophageal reflux disease) No date: Hyperlipidemia No date: Hypertension No date: Joint pain     Comment:  as reported by patient No date: Papillary fibroelastoma of heart     Comment:  of the aortic valve,  s/p  AVR and tumor resection on               08/04/2021 04/29/2021: Right thalamic infarction [facility]) No date: S/P AVR (aortic valve replacement)     Comment:  s/p AVR with resection of AV fibroelastoma on 08/04/2021 No date: S/P CABG x 4     Comment:  a. s/p CABG x4 LIMA to LAD, SVG to PDA, and Sequential               SVG to OM and Diagonal on 8/ 04/2021: Stroke [facility])     Comment:  admitted at [facility] - left sided weakness No date: Urinary incontinence     Comment:  as stated by patient  Reproductive/Obstetrics                             Anesthesia Physical Anesthesia Plan  ASA: 3  Anesthesia Plan: General   Post-op Pain Management: Minimal or no pain anticipated   Induction: Intravenous  PONV Risk Score and Plan: 3 and Ondansetron, Dexamethasone and Treatment may vary due to age or medical condition  Airway Management Planned: Oral ETT  Additional Equipment: None  Intra-op Plan:   Post-operative Plan: Extubation in OR  Informed Consent: I have reviewed the patients History and Physical, chart, labs and discussed the procedure including the risks, benefits and alternatives for the proposed anesthesia with the  patient or authorized representative who has indicated his/her understanding and acceptance.     Dental advisory given  Plan Discussed with: CRNA and Surgeon  Anesthesia Plan Comments:         Anesthesia Quick Evaluation

## 2023-06-02 NOTE — Inpatient Diabetes Management (Signed)
Inpatient Diabetes Program Recommendations  AACE/ADA: New Consensus Statement on Inpatient Glycemic Control (2015)  Target Ranges:  Prepandial:   less than 140 mg/dL      Peak postprandial:   less than 180 mg/dL (1-2 hours)      Critically ill patients:  140 - 180 mg/dL   Lab Results  Component Value Date   GLUCAP 150 (H) 06/02/2023   HGBA1C 11.0 (H) 05/31/2023    Spoke with patient regarding A1c of 11.0. (Average blood glucose 269 over the past 2-3 months) Patient states she doesn't see well and unable to see prescription bottle labels to tell what she is to take. Patient plans to discuss with her daughter regarding a pill box and have her daughter fill the box with her medications.  Patient to periop to prepare for hip surgery today.  Thank you, Billy Fischer. Marcellius Montagna, RN, MSN, CDE  Diabetes Coordinator Inpatient Glycemic Control Team Team Pager [phone] (8am-5pm) 06/02/2023 1:07 PM

## 2023-06-02 NOTE — Progress Notes (Signed)
  Progress Note   Patient: [name] ZOX:[id number] DOB: [birth year] DOA: 05/31/2023     2 DOS: the patient was seen and examined on 06/02/2023   Brief hospital course: 67 y.o. female with medical history significant for disease status post four-vessel CABG, type 2 diabetes mellitus, hypertension, CVA, dyslipidemia, GERD, depression and anxiety admitted for left femoral neck fracture status post fall  6/18: Ortho consult, plan for surgery tomorrow  Assessment and Plan: * Closed left hip fracture (HCC) - Left hip hemiarthroplasty today, successful - Pain management as ordered, bowel regimen - f/u cbc tomorrow - pt/ot tomorrow - dvt ppx per ortho  Uncontrolled type 2 diabetes mellitus with hyperglycemia, without long-term current use of insulin (HCC) - Diabetic nurse consulted - Continue sliding scale and insulin Semglee for better blood sugar control - hold off metformin and glyburide - Continue Jardiance. Hemoglobin A1c 11 suggestive of poor blood sugar control  Hypertensive urgency - This could be related to her pain and partly noncompliance. -- much improved - Continue amlodipine and Cozaar, as needed IV labetalol for better blood pressure control.  Dyslipidemia - continue statin therapy   Anxiety and depression - continue Xanax and Zoloft.  As needed trazodone for sleep        Subjective: seen after surgery, complaining of pain left hip  Physical Exam: Vitals:   06/02/23 1236 06/02/23 1555 06/02/23 1600 06/02/23 1615  BP: (!) 160/92 (!) 162/74 (!) 158/74 (!) 154/68  Pulse: 79 84 81 81  Resp: 16 15 18 15   Temp:  98.2 F (36.8 C)    TempSrc:      SpO2: 94% 97% 97% 99%  Weight: 79.4 kg     Height: 5\' 7"  (1.9 m)      67 year old female lying in the bed comfortably without any acute distress Lungs clear to auscultation bilaterally Heart regular rate and rhythm Abdomen soft, benign Neuro alert and awake, nonfocal Extremities: Left lower extremity distal  sensation intact, bandage over lateral side Data Reviewed:  There are no new results to review at this time.  Family Communication: None at bedside  Disposition: Status is: Inpatient Remains inpatient appropriate because: Management of left hip/femoral neck fracture  Planned Discharge Destination: Skilled nursing facility, likely   DVT prophylaxis-Lovenox (1 dose given today.  Hold after that for planned surgery tomorrow) Time spent: 35 minutes  Author: Silvano Bilis, MD 06/02/2023 4:25 PM  For on call review [email or web address].

## 2023-06-02 NOTE — Progress Notes (Addendum)
Subjective:  Patient complains of continued left hip pain particularly with movement.  Objective:   VITALS:   Vitals:   06/01/23 2353 06/02/23 0000 06/02/23 0847 06/02/23 1236  BP: (!) 139/107  (!) 148/81 (!) 160/92  Pulse: 98  87 79  Resp: 18  16 16   Temp: (!) 100.4 F (38 C) 99.2 F (37.3 C) 97.8 F (36.6 C)   TempSrc:  Oral    SpO2: (!) 88% 95% 95% 94%  Weight:    79.4 kg  Height:    5\' 7"  (1.702 m)    PHYSICAL EXAM: Left lower extremity: Left LE shortened and externally rotated. Neurovascular intact Sensation intact distally Intact pulses distally Dorsiflexion/Plantar flexion intact No cellulitis present Compartment soft  LABS  Results for orders placed or performed during the hospital encounter of 05/31/23 (from the past 24 hour(s))  Glucose, capillary     Status: Abnormal   Collection Time: 06/01/23  3:10 PM  Result Value Ref Range   Glucose-Capillary 204 (H) 70 - 99 mg/dL  Type and screen     Status: None   Collection Time: 06/01/23  3:23 PM  Result Value Ref Range   ABO/RH(D) O POS    Antibody Screen NEG    Sample Expiration      06/04/2023,2359 Performed at [facility], [address] [place], [place] [address]   Basic metabolic panel     Status: Abnormal   Collection Time: 06/01/23  4:57 PM  Result Value Ref Range   Sodium 133 (L) 135 - 145 mmol/L   Potassium 3.7 3.5 - 5.1 mmol/L   Chloride 98 98 - 111 mmol/L   CO2 26 22 - 32 mmol/L   Glucose, Bld 201 (H) 70 - 99 mg/dL   BUN 20 8 - 23 mg/dL   Creatinine, Ser 6.04 0.44 - 1.00 mg/dL   Calcium 8.7 (L) 8.9 - 10.3 mg/dL   GFR, Estimated >54 >09 mL/min   Anion gap 10 5 - 15  Surgical pcr screen     Status: None   Collection Time: 06/01/23  5:11 PM   Specimen: Nasal Mucosa; Nasal Swab  Result Value Ref Range   MRSA, PCR NEGATIVE NEGATIVE   Staphylococcus aureus NEGATIVE NEGATIVE  Glucose, capillary     Status: Abnormal   Collection Time: 06/01/23  6:01 PM  Result Value Ref Range    Glucose-Capillary 178 (H) 70 - 99 mg/dL  Glucose, capillary     Status: Abnormal   Collection Time: 06/01/23  9:16 PM  Result Value Ref Range   Glucose-Capillary 168 (H) 70 - 99 mg/dL  CBC     Status: None   Collection Time: 06/02/23  4:45 AM  Result Value Ref Range   WBC 9.0 4.0 - 10.5 K/uL   RBC 4.77 3.87 - 5.11 MIL/uL   Hemoglobin 13.4 12.0 - 15.0 g/dL   HCT 81.1 91.4 - 78.2 %   MCV 84.5 80.0 - 100.0 fL   MCH 28.1 26.0 - 34.0 pg   MCHC 33.3 30.0 - 36.0 g/dL   RDW 95.6 21.3 - 08.6 %   Platelets 257 150 - 400 K/uL   nRBC 0.0 0.0 - 0.2 %  Glucose, capillary     Status: Abnormal   Collection Time: 06/02/23  6:43 AM  Result Value Ref Range   Glucose-Capillary 201 (H) 70 - 99 mg/dL  Glucose, capillary     Status: Abnormal   Collection Time: 06/02/23  9:44 AM  Result Value Ref  Range   Glucose-Capillary 212 (H) 70 - 99 mg/dL  Glucose, capillary     Status: Abnormal   Collection Time: 06/02/23 12:00 PM  Result Value Ref Range   Glucose-Capillary 150 (H) 70 - 99 mg/dL    Chest Portable 1 View  Result Date: 05/31/2023 CLINICAL DATA:  Preop chest radiograph. EXAM: PORTABLE CHEST 1 VIEW COMPARISON:  Chest radiograph dated 11/05/2021. FINDINGS: The lungs are clear. There is no pleural effusion or pneumothorax. The cardiac silhouette is within normal limits. Median sternotomy wires and CABG vascular clips. No acute osseous pathology. IMPRESSION: No active disease. Electronically Signed   By: Elgie Collard M.D.   On: 05/31/2023 23:43   DG Hip Unilat W or Wo Pelvis 2-3 Views Left  Result Date: 05/31/2023 CLINICAL DATA:  Fall EXAM: DG HIP (WITH OR WITHOUT PELVIS) 2-3V LEFT COMPARISON:  CT 08/23/2021 FINDINGS: SI joints are non widened. Pubic symphysis and rami appear intact. Right hip replacement with intact hardware and normal alignment. Possible dystrophic calcification inferior to the right ischium. Acute left femoral neck fracture with apex anterior and lateral angulation. No femoral  head dislocation IMPRESSION: Acute left femoral neck fracture. Electronically Signed   By: Jasmine Pang M.D.   On: 05/31/2023 21:00    Assessment/Plan: Day of Surgery   Principal Problem:   Closed left hip fracture (HCC) Active Problems:   Anxiety and depression   Hypertensive urgency   Hyponatremia   Uncontrolled type 2 diabetes mellitus with hyperglycemia, without long-term current use of insulin (HCC)   Dyslipidemia  I discussed the details of the operation and post-op course with the patient.  I answered all her questions.  The left hip is marked.  I discussed the risks and benefits of surgery. The risks include but are not limited to infection, bleeding requiring blood transfusion, nerve or blood vessel injury, joint stiffness or loss of motion, persistent pain, weakness or instability, fracture, dislocation and the need for further surgery. Patient understood these risks and wished to proceed.     Juanell Fairly , MD 06/02/2023, 1:00 PM

## 2023-06-02 NOTE — Progress Notes (Signed)
Subjective:  POST OP CHECK IN PACU s/p left hip hemiarthroplasty.   Patient reports left hip pain as mild.    Objective:   VITALS:   Vitals:   06/02/23 1236 06/02/23 1555 06/02/23 1600 06/02/23 1615  BP: (!) 160/92 (!) 162/74 (!) 158/74 (!) 154/68  Pulse: 79 84 81 81  Resp: 16 15 18 15   Temp:  98.2 F (36.8 C)    TempSrc:      SpO2: 94% 97% 97% 99%  Weight: 79.4 kg     Height: 5\' 7"  (1.702 m)       PHYSICAL EXAM: Left lower extremity Neurovascular intact Sensation intact distally Intact pulses distally Dorsiflexion/Plantar flexion intact Incision: dressing C/D/I No cellulitis present Compartment soft  LABS  Results for orders placed or performed during the hospital encounter of 05/31/23 (from the past 24 hour(s))  Basic metabolic panel     Status: Abnormal   Collection Time: 06/01/23  4:57 PM  Result Value Ref Range   Sodium 133 (L) 135 - 145 mmol/L   Potassium 3.7 3.5 - 5.1 mmol/L   Chloride 98 98 - 111 mmol/L   CO2 26 22 - 32 mmol/L   Glucose, Bld 201 (H) 70 - 99 mg/dL   BUN 20 8 - 23 mg/dL   Creatinine, Ser 4.54 0.44 - 1.00 mg/dL   Calcium 8.7 (L) 8.9 - 10.3 mg/dL   GFR, Estimated >09 >81 mL/min   Anion gap 10 5 - 15  Surgical pcr screen     Status: None   Collection Time: 06/01/23  5:11 PM   Specimen: Nasal Mucosa; Nasal Swab  Result Value Ref Range   MRSA, PCR NEGATIVE NEGATIVE   Staphylococcus aureus NEGATIVE NEGATIVE  Glucose, capillary     Status: Abnormal   Collection Time: 06/01/23  6:01 PM  Result Value Ref Range   Glucose-Capillary 178 (H) 70 - 99 mg/dL  Glucose, capillary     Status: Abnormal   Collection Time: 06/01/23  9:16 PM  Result Value Ref Range   Glucose-Capillary 168 (H) 70 - 99 mg/dL  CBC     Status: None   Collection Time: 06/02/23  4:45 AM  Result Value Ref Range   WBC 9.0 4.0 - 10.5 K/uL   RBC 4.77 3.87 - 5.11 MIL/uL   Hemoglobin 13.4 12.0 - 15.0 g/dL   HCT 19.1 47.8 - 29.5 %   MCV 84.5 80.0 - 100.0 fL   MCH 28.1 26.0 -  34.0 pg   MCHC 33.3 30.0 - 36.0 g/dL   RDW 62.1 30.8 - 65.7 %   Platelets 257 150 - 400 K/uL   nRBC 0.0 0.0 - 0.2 %  Glucose, capillary     Status: Abnormal   Collection Time: 06/02/23  6:43 AM  Result Value Ref Range   Glucose-Capillary 201 (H) 70 - 99 mg/dL  Glucose, capillary     Status: Abnormal   Collection Time: 06/02/23  9:44 AM  Result Value Ref Range   Glucose-Capillary 212 (H) 70 - 99 mg/dL  Glucose, capillary     Status: Abnormal   Collection Time: 06/02/23 12:00 PM  Result Value Ref Range   Glucose-Capillary 150 (H) 70 - 99 mg/dL  Glucose, capillary     Status: Abnormal   Collection Time: 06/02/23  4:16 PM  Result Value Ref Range   Glucose-Capillary 116 (H) 70 - 99 mg/dL    Chest Portable 1 View  Result Date: 05/31/2023 CLINICAL DATA:  Preop chest radiograph. EXAM:  PORTABLE CHEST 1 VIEW COMPARISON:  Chest radiograph dated 11/05/2021. FINDINGS: The lungs are clear. There is no pleural effusion or pneumothorax. The cardiac silhouette is within normal limits. Median sternotomy wires and CABG vascular clips. No acute osseous pathology. IMPRESSION: No active disease. Electronically Signed   By: Elgie Collard M.D.   On: 05/31/2023 23:43   DG Hip Unilat W or Wo Pelvis 2-3 Views Left  Result Date: 05/31/2023 CLINICAL DATA:  Fall EXAM: DG HIP (WITH OR WITHOUT PELVIS) 2-3V LEFT COMPARISON:  CT 08/23/2021 FINDINGS: SI joints are non widened. Pubic symphysis and rami appear intact. Right hip replacement with intact hardware and normal alignment. Possible dystrophic calcification inferior to the right ischium. Acute left femoral neck fracture with apex anterior and lateral angulation. No femoral head dislocation IMPRESSION: Acute left femoral neck fracture. Electronically Signed   By: Jasmine Pang M.D.   On: 05/31/2023 21:00    Assessment/Plan: Day of Surgery   Principal Problem:   Closed left hip fracture (HCC) Active Problems:   Anxiety and depression   Hypertensive  urgency   History of CVA with residual deficit   Hyponatremia   S/P CABG x 4   Uncontrolled type 2 diabetes mellitus with hyperglycemia, without long-term current use of insulin (HCC)   Dyslipidemia  Patient is stable postop.  She is awaiting postoperative x-rays in the PACU.  Patient has a Foley catheter in place she will be left in overnight.  Patient will have labs rechecked in the morning.  She will begin physical therapy tomorrow.  Patient is weightbearing as tolerated left lower extremity with posterior hip precautions.  Patient has had a previous stroke causing left-sided weakness.  Patient will require skilled nursing facility upon discharge.    [name] , MD 06/02/2023, 4:33 PM

## 2023-06-02 NOTE — Op Note (Signed)
06/02/2023  4:01 PM  PATIENT:  [name]   MRN: [id number]  PRE-OPERATIVE DIAGNOSIS:  Left femoral neck hip fracture  POST-OPERATIVE DIAGNOSIS:  same  PROCEDURE:  Procedure(s): Left hip hemiarthroplasty  PREOPERATIVE INDICATIONS:    [name] is an 67 y.o. female who was admitted with a diagnosis of a displaced left femoral neck hip fracture.  I have recommended surgical fixation with hemiarthroplasty for this injury. I have explained the surgery and the postoperative course to the patient who has agreed with surgical management of this fracture.    The risks benefits and alternatives were discussed with the patient and their family including but not limited to the risks of  infection requiring removal of the prosthesis, bleeding requiring blood transfusion, nerve injury especially to the sciatic nerve leading to foot drop or lower extremity numbness, periprosthetic fracture, dislocation leg length discrepancy, change in lower extremity rotation persistent hip pain, loosening or failure of the components and the need for revision surgery. Medical risks include but are not limited to DVT and pulmonary embolism, myocardial infarction, stroke, pneumonia, respiratory failure and death.  OPERATIVE REPORT     SURGEON:  Juanell Fairly, MD    ASSISTANT: Surgical tech    ANESTHESIA: General    COMPLICATIONS:  None.   SPECIMEN: Femoral head to pathology    COMPONENTS:  [employer] Accolade 2 132 degree neck angle femoral component size 4 with a 47 mm +12 neck adjustment sleeve.    PROCEDURE IN DETAIL:   The patient was met in the holding area and  identified.  The appropriate hip was identified and marked at the operative site after verbally confirming with the patient that this was the correct site of surgery.  The patient was then transported to the OR  and  underwent general anesthesia.  The patient was then placed in the lateral decubitus position with the operative side up and  secured on the operating room table with a pegboard and all bony prominences were adequately padded. This included an axillary roll and additional padding around the nonoperative leg to prevent compression to the common peroneal nerve.    The operative lower extremity was prepped and draped in a sterile fashion.  A time out was performed prior to incision to verify patient's name, date of birth, medical record number, correct site of surgery correct procedure to be performed. The timeout was also used to verify the patient received antibiotics now appropriate instruments, implants and radiographic studies were available in the room. Once all in attendance were in agreement case began.    A posterolateral approach was utilized via sharp dissection  carried down to the subcutaneous tissue.  Bleeding vessels were coagulated using electrocautery.  The fascia lata was identified and incised along the length of the skin incision.  The gluteus maximus muscle was then split in line with its fibers. Self-retaining retractors were  inserted.  With the hip internally rotated, the short external rotators  were identified and removed from the posterior attachment from the greater trochanter. The piriformis was tagged for later repair. The capsule was identified and a T-shaped capsulotomy was performed. The capsule was tagged with #2 Tycron for later repair.  The femoral neck fracture was exposed, and the femoral head was removed using a corkscrew device. This was measured to be 47 mm in diameter. The attention was then turned to proximal femur preparation.  An oscillating saw was used to perform a proximal femoral osteotomy 1 fingerbreadth above the lesser  trochanter. The trial 47 mm femoral head was placed into the acetabulum and had an excellent suction fit. The attention was then turned back to femoral preparation.    A femoral skid and Cobra retractor were placed under the femoral neck to allow for adequate  visualization. A box osteotome was used to make the initial entry into the proximal femur. A single hand reamer was used to prepare the femoral canal. A T-shaped femoral canal sounder was then used to ensure no penetration femoral cortex had occurred during reaming. The proximal femur was then sequentially broached by hand. A size 4 femoral trial broach was found to have best medial to lateral canal fit. Once adequate mediolateral canal fill was achieved the trial femoral broach, neck, and head was assembled and the hip was reduced. It was found to have excellent stability, equivalent leg lengths with functional range of motion. The trial components were then removed.  I copiously irrigated the femoral canal and then impacted the real femoral prosthesis into place into the appropriate version, slightly anteverted to the normal anatomy, and I impacted the actual 47 mm Unitrax femoral component with a +12 neck adjustment sleeve into place. The hip was then reduced and taken through functional range of motion and found to have excellent stability. Leg lengths were restored. The hip joint was copiously irrigated.   A soft tissue repair of the capsule and external rotators was performed using #2 Tycron Excellent posterior capsular repair was achieved. The fascia lata was then closed with interrupted 0 Vicryl suture. The subcutaneous tissues were closed with 2-0 Vicryl and the skin approximated with staples.   The patient was then placed supine on the operative table. Leg lengths were checked clinically and found to be equivalent. An abduction pillow was placed between the lower extremities. The patient was then transferred to a hospital bed and brought to the PACU in stable condition. I was scrubbed and present the entire case and all sharp and instrument counts were correct at the conclusion of the case. I spoke with the patient's daughter by phone post-operatively to let her know the case was completed without  complication patient was stable in recovery room.   Kathreen Devoid, MD Orthopedic Surgeon

## 2023-06-02 NOTE — Plan of Care (Signed)

## 2023-06-02 NOTE — Anesthesia Postprocedure Evaluation (Signed)
Anesthesia Post Note  Patient: [name]  Procedure(s) Performed: ARTHROPLASTY BIPOLAR HIP (HEMIARTHROPLASTY) (Left: Hip)  Patient location during evaluation: PACU Anesthesia Type: General Level of consciousness: awake and alert Pain management: pain level controlled Vital Signs Assessment: post-procedure vital signs reviewed and stable Respiratory status: spontaneous breathing, nonlabored ventilation, respiratory function stable and patient connected to nasal cannula oxygen Cardiovascular status: blood pressure returned to baseline and stable Postop Assessment: no apparent nausea or vomiting Anesthetic complications: no   No notable events documented.   Last Vitals:  Vitals:   06/02/23 1640 06/02/23 1645  BP:  (!) 119/54  Pulse: 72 70  Resp: 14 13  Temp:  (!) 36.3 C  SpO2: 93% 92%    Last Pain:  Vitals:   06/02/23 1645  TempSrc:   PainSc: Asleep                 Cleda Mccreedy Neelah Mannings

## 2023-06-02 NOTE — Progress Notes (Signed)
Pt transferred off floor to surgery

## 2023-06-02 NOTE — Transfer of Care (Signed)
Immediate Anesthesia Transfer of Care Note  Patient: [name]  Procedure(s) Performed: ARTHROPLASTY BIPOLAR HIP (HEMIARTHROPLASTY) (Left: Hip)  Patient Location: PACU  Anesthesia Type:General  Level of Consciousness: awake and drowsy  Airway & Oxygen Therapy: Patient Spontanous Breathing and Patient connected to face mask oxygen  Post-op Assessment: Report given to RN and Post -op Vital signs reviewed and stable  Post vital signs: Reviewed and stable  Last Vitals:  Vitals Value Taken Time  BP 162/74 06/02/23 1555  Temp 36.1 1555  Pulse 81 06/02/23 1559  Resp 18 06/02/23 1559  SpO2 97 % 06/02/23 1559  Vitals shown include unvalidated device data.  Last Pain:  Vitals:   06/02/23 1236  TempSrc:   PainSc: 4          Complications: No notable events documented.

## 2023-06-02 NOTE — Anesthesia Procedure Notes (Signed)
Procedure Name: Intubation Date/Time: 06/02/2023 1:38 AM  Performed by: Edmund Hilda, CRNAPre-anesthesia Checklist: Patient identified, Patient being monitored, Timeout performed, Emergency Drugs available and Suction available Patient Re-evaluated:Patient Re-evaluated prior to induction Oxygen Delivery Method: Circle system utilized Preoxygenation: Pre-oxygenation with 100% oxygen Induction Type: IV induction Ventilation: Mask ventilation without difficulty Laryngoscope Size: Mac and 3 Grade View: Grade I Tube type: Oral Tube size: 7.0 mm Number of attempts: 1 Airway Equipment and Method: Stylet Placement Confirmation: ETT inserted through vocal cords under direct vision, positive ETCO2 and breath sounds checked- equal and bilateral Secured at: 21 cm Tube secured with: Tape Dental Injury: Teeth and Oropharynx as per pre-operative assessment

## 2023-06-03 ENCOUNTER — Encounter: Payer: Self-pay | Admitting: Orthopedic Surgery

## 2023-06-03 LAB — CBC
HCT: 35.8 % — ABNORMAL LOW (ref 36.0–46.0)
Hemoglobin: 11.8 g/dL — ABNORMAL LOW (ref 12.0–15.0)
MCH: 28.3 pg (ref 26.0–34.0)
MCHC: 33 g/dL (ref 30.0–36.0)
MCV: 85.9 fL (ref 80.0–100.0)
Platelets: 227 10*3/uL (ref 150–400)
RBC: 4.17 MIL/uL (ref 3.87–5.11)
RDW: 12.4 % (ref 11.5–15.5)
WBC: 11.2 10*3/uL — ABNORMAL HIGH (ref 4.0–10.5)
nRBC: 0 % (ref 0.0–0.2)

## 2023-06-03 LAB — BASIC METABOLIC PANEL
Anion gap: 9 (ref 5–15)
BUN: 30 mg/dL — ABNORMAL HIGH (ref 8–23)
CO2: 23 mmol/L (ref 22–32)
Calcium: 7.8 mg/dL — ABNORMAL LOW (ref 8.9–10.3)
Chloride: 102 mmol/L (ref 98–111)
Creatinine, Ser: 0.99 mg/dL (ref 0.44–1.00)
GFR, Estimated: >60 mL/min (ref >60)
Glucose, Bld: 322 mg/dL — ABNORMAL HIGH (ref 70–99)
Potassium: 4.6 mmol/L (ref 3.5–5.1)
Sodium: 134 mmol/L — ABNORMAL LOW (ref 135–145)

## 2023-06-03 LAB — GLUCOSE, CAPILLARY
Glucose-Capillary: 315 mg/dL — ABNORMAL HIGH (ref 70–99)
Glucose-Capillary: 231 mg/dL — ABNORMAL HIGH (ref 70–99)
Glucose-Capillary: 243 mg/dL — ABNORMAL HIGH (ref 70–99)
Glucose-Capillary: 283 mg/dL — ABNORMAL HIGH (ref 70–99)
Glucose-Capillary: 191 mg/dL — ABNORMAL HIGH (ref 70–99)
Glucose-Capillary: 160 mg/dL — ABNORMAL HIGH (ref 70–99)

## 2023-06-03 MED ORDER — INSULIN ASPART 100 UNIT/ML IJ SOLN
3.0000 [IU] | Freq: Three times a day (TID) | INTRAMUSCULAR | Status: DC
  Administered 2023-06-03 – 2023-06-07 (×8): 3 [IU] via SUBCUTANEOUS
  Filled 2023-06-03 (×8): qty 1

## 2023-06-03 MED ORDER — POLYETHYLENE GLYCOL 3350 17 G PO PACK
17.0000 g | PACK | Freq: Every day | ORAL | Status: DC
  Administered 2023-06-03 – 2023-06-04 (×2): 17 g via ORAL
  Filled 2023-06-03 (×2): qty 1

## 2023-06-03 MED ORDER — ASPIRIN 81 MG PO TBEC
81.0000 mg | DELAYED_RELEASE_TABLET | Freq: Every day | ORAL | Status: DC
  Administered 2023-06-03 – 2023-06-11 (×9): 81 mg via ORAL
  Filled 2023-06-03 (×9): qty 1

## 2023-06-03 MED ORDER — MORPHINE SULFATE (PF) 2 MG/ML IV SOLN: 2.0000 mg | INTRAVENOUS | Status: DC | PRN

## 2023-06-03 NOTE — Plan of Care (Signed)
Problem: Education: Goal: Ability to describe self-care measures that may prevent or decrease complications (Diabetes Survival Skills Education) will improve 06/03/2023 1720 by Leonie Douglas, RN Outcome: Progressing 06/03/2023 1719 by Leonie Douglas, RN Outcome: Progressing Goal: Individualized Educational Video(s) 06/03/2023 1720 by Leonie Douglas, RN Outcome: Progressing 06/03/2023 1719 by Leonie Douglas, RN Outcome: Progressing   Problem: Coping: Goal: Ability to adjust to condition or change in health will improve 06/03/2023 1720 by Leonie Douglas, RN Outcome: Progressing 06/03/2023 1719 by Leonie Douglas, RN Outcome: Progressing   Problem: Fluid Volume: Goal: Ability to maintain a balanced intake and output will improve 06/03/2023 1720 by Leonie Douglas, RN Outcome: Progressing 06/03/2023 1719 by Leonie Douglas, RN Outcome: Progressing   Problem: Health Behavior/Discharge Planning: Goal: Ability to identify and utilize available resources and services will improve 06/03/2023 1720 by Leonie Douglas, RN Outcome: Progressing 06/03/2023 1719 by Leonie Douglas, RN Outcome: Progressing Goal: Ability to manage health-related needs will improve 06/03/2023 1720 by Leonie Douglas, RN Outcome: Progressing 06/03/2023 1719 by Leonie Douglas, RN Outcome: Progressing   Problem: Metabolic: Goal: Ability to maintain appropriate glucose levels will improve 06/03/2023 1720 by Leonie Douglas, RN Outcome: Progressing 06/03/2023 1719 by Leonie Douglas, RN Outcome: Progressing   Problem: Nutritional: Goal: Maintenance of adequate nutrition will improve 06/03/2023 1720 by Leonie Douglas, RN Outcome: Progressing 06/03/2023 1719 by Leonie Douglas, RN Outcome: Progressing Goal: Progress toward achieving an optimal weight will improve 06/03/2023 1720 by Leonie Douglas, RN Outcome: Progressing 06/03/2023 1719 by Leonie Douglas,  RN Outcome: Progressing   Problem: Skin Integrity: Goal: Risk for impaired skin integrity will decrease 06/03/2023 1720 by Leonie Douglas, RN Outcome: Progressing 06/03/2023 1719 by Leonie Douglas, RN Outcome: Progressing   Problem: Tissue Perfusion: Goal: Adequacy of tissue perfusion will improve 06/03/2023 1720 by Leonie Douglas, RN Outcome: Progressing 06/03/2023 1719 by Leonie Douglas, RN Outcome: Progressing   Problem: Education: Goal: Knowledge of General Education information will improve Description: Including pain rating scale, medication(s)/side effects and non-pharmacologic comfort measures 06/03/2023 1720 by Leonie Douglas, RN Outcome: Progressing 06/03/2023 1719 by Leonie Douglas, RN Outcome: Progressing   Problem: Health Behavior/Discharge Planning: Goal: Ability to manage health-related needs will improve 06/03/2023 1720 by Leonie Douglas, RN Outcome: Progressing 06/03/2023 1719 by Leonie Douglas, RN Outcome: Progressing   Problem: Clinical Measurements: Goal: Ability to maintain clinical measurements within normal limits will improve 06/03/2023 1720 by Leonie Douglas, RN Outcome: Progressing 06/03/2023 1719 by Leonie Douglas, RN Outcome: Progressing Goal: Will remain free from infection 06/03/2023 1720 by Leonie Douglas, RN Outcome: Progressing 06/03/2023 1719 by Leonie Douglas, RN Outcome: Progressing Goal: Diagnostic test results will improve 06/03/2023 1720 by Leonie Douglas, RN Outcome: Progressing 06/03/2023 1719 by Leonie Douglas, RN Outcome: Progressing Goal: Respiratory complications will improve 06/03/2023 1720 by Leonie Douglas, RN Outcome: Progressing 06/03/2023 1719 by Leonie Douglas, RN Outcome: Progressing Goal: Cardiovascular complication will be avoided 06/03/2023 1720 by Leonie Douglas, RN Outcome: Progressing 06/03/2023 1719 by Leonie Douglas, RN Outcome: Progressing    Problem: Activity: Goal: Risk for activity intolerance will decrease 06/03/2023 1720 by Leonie Douglas, RN Outcome: Progressing 06/03/2023 1719 by Leonie Douglas, RN Outcome: Progressing   Problem: Nutrition: Goal: Adequate nutrition will be maintained 06/03/2023 1720 by Leonie Douglas, RN Outcome: Progressing 06/03/2023 1719 by Leonie Douglas, RN Outcome: Progressing  Problem: Coping: Goal: Level of anxiety will decrease 06/03/2023 1720 by Leonie Douglas, RN Outcome: Progressing 06/03/2023 1719 by Leonie Douglas, RN Outcome: Progressing   Problem: Elimination: Goal: Will not experience complications related to bowel motility 06/03/2023 1720 by Leonie Douglas, RN Outcome: Progressing 06/03/2023 1719 by Leonie Douglas, RN Outcome: Progressing Goal: Will not experience complications related to urinary retention 06/03/2023 1720 by Leonie Douglas, RN Outcome: Progressing 06/03/2023 1719 by Leonie Douglas, RN Outcome: Progressing   Problem: Pain Managment: Goal: General experience of comfort will improve 06/03/2023 1720 by Leonie Douglas, RN Outcome: Progressing 06/03/2023 1719 by Leonie Douglas, RN Outcome: Progressing   Problem: Safety: Goal: Ability to remain free from injury will improve 06/03/2023 1720 by Leonie Douglas, RN Outcome: Progressing 06/03/2023 1719 by Leonie Douglas, RN Outcome: Progressing   Problem: Skin Integrity: Goal: Risk for impaired skin integrity will decrease 06/03/2023 1720 by Leonie Douglas, RN Outcome: Progressing 06/03/2023 1719 by Leonie Douglas, RN Outcome: Progressing   Problem: Education: Goal: Verbalization of understanding the information provided (i.e., activity precautions, restrictions, etc) will improve 06/03/2023 1720 by Leonie Douglas, RN Outcome: Progressing 06/03/2023 1719 by Leonie Douglas, RN Outcome: Progressing Goal: Individualized Educational  Video(s) 06/03/2023 1720 by Leonie Douglas, RN Outcome: Progressing 06/03/2023 1719 by Leonie Douglas, RN Outcome: Progressing   Problem: Activity: Goal: Ability to ambulate and perform ADLs will improve 06/03/2023 1720 by Leonie Douglas, RN Outcome: Progressing 06/03/2023 1719 by Leonie Douglas, RN Outcome: Progressing   Problem: Clinical Measurements: Goal: Postoperative complications will be avoided or minimized 06/03/2023 1720 by Leonie Douglas, RN Outcome: Progressing 06/03/2023 1719 by Leonie Douglas, RN Outcome: Progressing   Problem: Self-Concept: Goal: Ability to maintain and perform role responsibilities to the fullest extent possible will improve 06/03/2023 1720 by Leonie Douglas, RN Outcome: Progressing 06/03/2023 1719 by Leonie Douglas, RN Outcome: Progressing   Problem: Pain Management: Goal: Pain level will decrease 06/03/2023 1720 by Leonie Douglas, RN Outcome: Progressing 06/03/2023 1719 by Leonie Douglas, RN Outcome: Progressing

## 2023-06-03 NOTE — Progress Notes (Signed)
Subjective:  POD #1 s/p left hip hemiarthroplasty.   Patient reports that she has no pain at rest in the bed but has increased pain when she moves.  She states she spent approximately 2 and half hours in a chair today.  She was able to take a few steps with physical therapy..    Objective:   VITALS:   Vitals:   06/03/23 0409 06/03/23 0741 06/03/23 1214 06/03/23 1602  BP: 138/72 (!) 144/68 (!) 141/62 130/73  Pulse: 65 74 84 79  Resp: 20 16 18 13   Temp: (!) 97.5 F (36.4 C) 98.3 F (36.8 C) 97.8 F (36.6 C) 98.1 F (36.7 C)  TempSrc:  Oral Oral Oral  SpO2: 93% 92% 91% 93%  Weight:      Height:        PHYSICAL EXAM: Left lower extremity Neurovascular intact Sensation intact distally Intact pulses distally Dorsiflexion/Plantar flexion intact Incision: dressing C/D/I No cellulitis present Compartment soft  LABS  Results for orders placed or performed during the hospital encounter of 05/31/23 (from the past 24 hour(s))  Glucose, capillary     Status: Abnormal   Collection Time: 06/02/23  9:38 PM  Result Value Ref Range   Glucose-Capillary 282 (H) 70 - 99 mg/dL   Comment 1 Notify RN   CBC     Status: Abnormal   Collection Time: 06/03/23  4:07 AM  Result Value Ref Range   WBC 11.2 (H) 4.0 - 10.5 K/uL   RBC 4.17 3.87 - 5.11 MIL/uL   Hemoglobin 11.8 (L) 12.0 - 15.0 g/dL   HCT 16.1 (L) 09.6 - 04.5 %   MCV 85.9 80.0 - 100.0 fL   MCH 28.3 26.0 - 34.0 pg   MCHC 33.0 30.0 - 36.0 g/dL   RDW 40.9 81.1 - 91.4 %   Platelets 227 150 - 400 K/uL   nRBC 0.0 0.0 - 0.2 %  Basic metabolic panel     Status: Abnormal   Collection Time: 06/03/23  4:07 AM  Result Value Ref Range   Sodium 134 (L) 135 - 145 mmol/L   Potassium 4.6 3.5 - 5.1 mmol/L   Chloride 102 98 - 111 mmol/L   CO2 23 22 - 32 mmol/L   Glucose, Bld 322 (H) 70 - 99 mg/dL   BUN 30 (H) 8 - 23 mg/dL   Creatinine, Ser 7.82 0.44 - 1.00 mg/dL   Calcium 7.8 (L) 8.9 - 10.3 mg/dL   GFR, Estimated >95 >62 mL/min   Anion gap  9 5 - 15  Glucose, capillary     Status: Abnormal   Collection Time: 06/03/23  7:39 AM  Result Value Ref Range   Glucose-Capillary 315 (H) 70 - 99 mg/dL  Glucose, capillary     Status: Abnormal   Collection Time: 06/03/23 11:44 AM  Result Value Ref Range   Glucose-Capillary 243 (H) 70 - 99 mg/dL  Glucose, capillary     Status: Abnormal   Collection Time: 06/03/23 12:21 PM  Result Value Ref Range   Glucose-Capillary 231 (H) 70 - 99 mg/dL  Glucose, capillary     Status: Abnormal   Collection Time: 06/03/23  2:07 PM  Result Value Ref Range   Glucose-Capillary 283 (H) 70 - 99 mg/dL  Glucose, capillary     Status: Abnormal   Collection Time: 06/03/23  5:33 PM  Result Value Ref Range   Glucose-Capillary 191 (H) 70 - 99 mg/dL    DG Hip Port Unilat With Pelvis 1V Left  Result Date: 06/02/2023 CLINICAL DATA:  Hip fracture. Postoperative for left hip arthroplasty. EXAM: DG HIP (WITH OR WITHOUT PELVIS) 1V PORT LEFT COMPARISON:  05/31/2023 FINDINGS: Interval left hip hemiarthroplasty noted. No periprosthetic fracture or acute complicating feature. There a right-sided (contralateral) total hip prosthesis. IMPRESSION: 1. Interval left hip hemiarthroplasty without complicating feature. Electronically Signed   By: Gaylyn Rong M.D.   On: 06/02/2023 16:44    Assessment/Plan: 1 Day Post-Op   Principal Problem:   Closed left hip fracture (HCC) Active Problems:   Anxiety and depression   Hypertensive urgency   History of CVA with residual deficit   Hyponatremia   S/P CABG x 4   Uncontrolled type 2 diabetes mellitus with hyperglycemia, without long-term current use of insulin (HCC)   Dyslipidemia  I reviewed the patient's postoperative x-rays which demonstrate the hemiarthroplasty prosthesis is well-positioned and there is no evidence of postop complication.  Hemoglobin and hematocrit remain stable.  Blood sugar remains elevated.  Continue with diabetes management as ordered.  Continue with  physical therapy.  Foley catheter has been removed.  Patient will continue Lovenox for DVT prophylaxis.  Patient requested morphine as needed for physical therapy only.  This has been ordered and Dilaudid was discontinued.  Patient will need a skilled nursing facility upon discharge.    Juanell Fairly , MD 06/03/2023, 6:53 PM

## 2023-06-03 NOTE — Progress Notes (Signed)
Nutrition Follow-up  DOCUMENTATION CODES:   Not applicable  INTERVENTION:   -Carb modified diet -Continue MVI with minerals daily -Glucerna Shake po BID, each supplement provides 220 kcal and 10 grams of protein   NUTRITION DIAGNOSIS:   Increased nutrient needs related to post-op healing as evidenced by estimated needs.  Ongoing  GOAL:   Patient will meet greater than or equal to 90% of their needs  Progressing   MONITOR:   PO intake, Supplement acceptance  REASON FOR ASSESSMENT:   Consult Assessment of nutrition requirement/status, Hip fracture protocol  ASSESSMENT:   Pt with medical history significant for disease status post four-vessel CABG, type 2 diabetes mellitus, hypertension, CVA, dyslipidemia, GERD, depression and anxiety, who presented with acute onset of accidental mechanical fall with subsequent left hip pain and inability to bear weight on it.  6/19- s/p Left hip hemiarthroplasty   Reviewed I/O's: -1 L x 24 hours and +475 ml since admission  UOP: 2.9 L x 24 hours   Pt in with therapy at time of visit.   Pt currently on a regular diet. No meal completion data available to assess at this time. Pt is dri king Glucerna supplements.   Per TOC notes, pt awaiting therapy evaluations for discharge disposition.   Medications reviewed and include colace, senokot, and miralax.   Labs reviewed: CBGS: 231-283 (inpatient orders for glycemic control are 10 mg empagliflozin daily, 0-9 units insulin aspart 4 times daily, 3 units insulin aspart TID with meals, and 15 units insulin glargine-yfgn daily).    NUTRITION - FOCUSED PHYSICAL EXAM:  Flowsheet Row Most Recent Value  Orbital Region No depletion  Upper Arm Region No depletion  Thoracic and Lumbar Region No depletion  Buccal Region No depletion  Temple Region No depletion  Clavicle Bone Region No depletion  Clavicle and Acromion Bone Region No depletion  Scapular Bone Region No depletion  Dorsal Hand No  depletion  Patellar Region No depletion  Anterior Thigh Region No depletion  Posterior Calf Region No depletion  Edema (RD Assessment) None  Hair Reviewed  Eyes Reviewed  Mouth Reviewed  Skin Reviewed  Nails Reviewed       Diet Order:   Diet Order             Diet regular Room service appropriate? Yes; Fluid consistency: Thin  Diet effective now                   EDUCATION NEEDS:   No education needs have been identified at this time  Skin:  Skin Assessment: Reviewed RN Assessment  Last BM:  Unknown  Height:   Ht Readings from Last 1 Encounters:  06/02/23 5\' 7"  (1.702 m)    Weight:   Wt Readings from Last 1 Encounters:  06/02/23 79.4 kg    Ideal Body Weight:  61.4 kg  BMI:  Body mass index is 27.42 kg/m.  Estimated Nutritional Needs:   Kcal:  1700-1900  Protein:  90-105 grams  Fluid:  > 1.7 L    Levada Schilling, RD, LDN, CDCES Registered Dietitian II Certified Diabetes Care and Education Specialist Please refer to [facility] for RD and/or RD on-call/weekend/after hours pager

## 2023-06-03 NOTE — Evaluation (Addendum)
Physical Therapy Evaluation Patient Details Name: [name] MRN: [id number] DOB: [birth year] Today's Date: 06/03/2023  History of Present Illness  Pt is a 67 y.o. female with PMH consisting of HTN, CAD, CABG, CVA (L sided symptoms), type 2 DM. Pt admitted to ED (6/17) with left hip pain following a mechanical fall at work. MD assessment includes: closed left femoral neck fx. Pt is now s/p left hip hemiarthroplasty (6/19).  Clinical Impression  Pt pleasant and motivated for therapy. Pt was educated at start of session on posterior hip precautions, HEP frequency. Pt demonstrated hesitancy with any movement of LLE secondary to pain. With VC's and min A for trunk control/LLE placement pt able to sit EOB, although she required a couple "rest" breaks while perform supine < sit for pain control. Once sitting pt able to perform STS with RW and min A for elevation, as well as verbal cues for proper UE/LE placement and hip precaution compliance. She failed the first two attempts to stand but was able to complete successfully from elevated bed height on the third attempt. Pt able to take a couple small, shuffling steps at EOB to get to recliner. Pt's vitals remained WNL throughout the session, with O2 starting at 89% in supine but increasing to 94% on room air once moving around. HR remained within mid 80's to 100 with ambulation. Pt will benefit from continued PT services upon discharge to safely address deficits listed in patient problem list for decreased caregiver assistance and eventual return to PLOF.      Recommendations for follow up therapy are one component of a multi-disciplinary discharge planning process, led by the attending physician.  Recommendations may be updated based on patient status, additional functional criteria and insurance authorization.  Follow Up Recommendations Can patient physically be transported by private vehicle: No     Assistance Recommended at Discharge Intermittent  Supervision/Assistance  Patient can return home with the following  A little help with walking and/or transfers;Help with stairs or ramp for entrance;Assist for transportation;A little help with bathing/dressing/bathroom;Assistance with cooking/housework    Equipment Recommendations BSC/3in1  Recommendations for Other Services       Functional Status Assessment Patient has had a recent decline in their functional status and demonstrates the ability to make significant improvements in function in a reasonable and predictable amount of time.     Precautions / Restrictions Precautions Precautions: Posterior Hip;Fall Precaution Booklet Issued: Yes (comment) Required Braces or Orthoses: Other Brace Other Brace: Hip abduction brace Restrictions Weight Bearing Restrictions: Yes LLE Weight Bearing: Weight bearing as tolerated      Mobility  Bed Mobility Overal bed mobility: Needs Assistance Bed Mobility: Supine to Sit     Supine to sit: Min assist     General bed mobility comments: Min A for trunk control and LLE placement. Increased time/effort to come to sitting EOB, pt had to take a couple breaks secondary to pain.    Transfers Overall transfer level: Needs assistance Equipment used: Rolling walker (2 wheels) Transfers: Sit to/from Stand Sit to Stand: Min assist, From elevated surface           General transfer comment: Min A for elevation. Verbal cues for correct UE/LE placement and hip precautions. Pt failed first couple attempts to STS but able to complete third attempt with elevated bed surface. She struggled with eccentric control upon sitting and demonstrated higher anxiety when going to sit.    Ambulation/Gait Ambulation/Gait assistance: Min Chemical engineer (Feet): 2 Feet Assistive  device: Rolling walker (2 wheels) Gait Pattern/deviations: Step-to pattern, Antalgic, Decreased stance time - left, Decreased weight shift to left, Shuffle Gait velocity:  decreased     General Gait Details: Pt able to take 2-3 small steps at EOB to get to recliner, min A for stability. She demonstrated hesitancy to WB on L side.  Stairs            Wheelchair Mobility    Modified Rankin (Stroke Patients Only)       Balance Overall balance assessment: Needs assistance Sitting-balance support: Feet supported, Single extremity supported Sitting balance-Leahy Scale: Fair     Standing balance support: Bilateral upper extremity supported, During functional activity, Reliant on assistive device for balance Standing balance-Leahy Scale: Fair Standing balance comment: Pt able to stand EOB with BUE on RW with no LOB                             Pertinent Vitals/Pain Pain Assessment Pain Assessment: 0-10 Pain Score: 5  Pain Location: L hip Pain Intervention(s): Monitored during session, Limited activity within patient's tolerance, Premedicated before session, Repositioned    Home Living Family/patient expects to be discharged to:: Private residence Living Arrangements: Alone Available Help at Discharge: Family;Available PRN/intermittently (Daughter) Type of Home: House Home Access: Stairs to enter Entrance Stairs-Rails: Right Entrance Stairs-Number of Steps: 3   Home Layout: One level Home Equipment: Agricultural consultant (2 wheels);Cane - quad;Shower seat      Prior Function Prior Level of Function : Needs assist       Physical Assist : ADLs (physical)   ADLs (physical): Bathing Mobility Comments: Daughter assists with showering. Used quad cane sparingly, limited community ambulator. Only 1 fall in last 6 months which was the one resulting in her L hip fx, reported tripping at work. ADLs Comments: Independent with most ADL's except showering/bathing. Independent with IADL's, transportation.     Hand Dominance   Dominant Hand: Right    Extremity/Trunk Assessment   Upper Extremity Assessment Upper Extremity Assessment:  Overall WFL for tasks assessed    Lower Extremity Assessment Lower Extremity Assessment: Generalized weakness       Communication   Communication: No difficulties  Cognition Arousal/Alertness: Awake/alert Behavior During Therapy: WFL for tasks assessed/performed Overall Cognitive Status: Within Functional Limits for tasks assessed                                          General Comments General comments (skin integrity, edema, etc.): Pt still has some general weakness, numbness on L side secondary to past CVA.    Exercises Total Joint Exercises Ankle Circles/Pumps: AROM, Both, 10 reps, Supine Quad Sets: Strengthening, Left, 10 reps, Supine Gluteal Sets: Strengthening, Both, 10 reps, Supine Heel Slides: AROM, Both, 10 reps, Supine Hip ABduction/ADduction: Strengthening, 10 reps, Both, Supine (Only hip ADD isometrics with pillow b/t knees) Other Exercises Other Exercises: Posterior hip precaution education   Assessment/Plan    PT Assessment Patient needs continued PT services  PT Problem List Decreased strength;Pain;Decreased activity tolerance;Decreased balance;Decreased mobility       PT Treatment Interventions DME instruction;Therapeutic exercise;Gait training;Stair training;Therapeutic activities;Patient/family education    PT Goals (Current goals can be found in the Care Plan section)  Acute Rehab PT Goals Patient Stated Goal: Walk better to go back to work PT Goal Formulation: With patient Time For  Goal Achievement: 06/16/23 Potential to Achieve Goals: Fair    Frequency BID     Co-evaluation               AM-PAC PT "6 Clicks" Mobility  Outcome Measure Help needed turning from your back to your side while in a flat bed without using bedrails?: A Little Help needed moving from lying on your back to sitting on the side of a flat bed without using bedrails?: A Little Help needed moving to and from a bed to a chair (including a  wheelchair)?: A Lot Help needed standing up from a chair using your arms (e.g., wheelchair or bedside chair)?: A Little Help needed to walk in hospital room?: A Lot Help needed climbing 3-5 steps with a railing? : Total 6 Click Score: 14    End of Session Equipment Utilized During Treatment: Gait belt Activity Tolerance: Patient limited by pain Patient left: in chair;with call bell/phone within reach;with chair alarm set Nurse Communication: Mobility status;Precautions;Weight bearing status PT Visit Diagnosis: Other abnormalities of gait and mobility (R26.89);Muscle weakness (generalized) (M62.81);Pain;Unsteadiness on feet (R26.81) Pain - Right/Left: Left Pain - part of body: Hip    Time: 1057-1207 PT Time Calculation (min) (ACUTE ONLY): 70 min   Charges:               Cena Benton, SPT 06/03/23, 1:24 PM This entire session was performed under direct supervision and direction of a licensed therapist/therapist assistant. I have personally read, edited and approve of the note as written.  Loran Senters, DPT

## 2023-06-03 NOTE — Progress Notes (Signed)
  Progress Note   Patient: [name] ZOX:[id number] DOB: 13-Apr-1956 DOA: 05/31/2023     3 DOS: the patient was seen and examined on 06/03/2023   Brief hospital course: 67 y.o. female with medical history significant for disease status post four-vessel CABG, type 2 diabetes mellitus, hypertension, CVA, dyslipidemia, GERD, depression and anxiety admitted for left femoral neck fracture status post fall  6/18: Ortho consult, plan for surgery tomorrow  Assessment and Plan: * Closed left hip fracture (HCC) - Left hip hemiarthroplasty 6/19, successful - hgb 11.8 post-op - Pain management   bowel regimen - foley has been discontinued - pt/ot today - lovenox for dvt ppx  Uncontrolled type 2 diabetes mellitus with hyperglycemia, without long-term current use of insulin (HCC) - Diabetic nurse consulted - Continue sliding scale and insulin Semglee for better blood sugar control. Didn't receive yesterday, will add meal-time - hold off metformin and glyburide - Continue Jardiance. Hemoglobin A1c 11 suggestive of poor blood sugar control  Hypertension controlled - Continue amlodipine and Cozaar, as needed IV labetalol for better blood pressure control.  Dyslipidemia - continue statin therapy   Hx CVA - cont aspirin, rosuvastatin  Anxiety and depression - continue Xanax and Zoloft.  As needed trazodone for sleep        Subjective: seen after surgery, complaining of pain left hip  Physical Exam: Vitals:   06/02/23 2002 06/03/23 0012 06/03/23 0409 06/03/23 0741  BP: 125/78 129/66 138/72 (!) 144/68  Pulse: 82 70 65 74  Resp: 20 20 20 16   Temp: 97.7 F (36.5 C) 97.9 F (36.6 C) (!) 97.5 F (36.4 C) 98.3 F (36.8 C)  TempSrc:    Oral  SpO2: 97% 95% 93% 92%  Weight:      Height:       67 year old female lying in the bed comfortably without any acute distress Lungs clear to auscultation bilaterally Heart regular rate and rhythm Abdomen soft, benign Neuro alert and  awake, nonfocal Extremities: Left lower extremity distal sensation intact, bandage over lateral side c/d/i Data Reviewed:  There are no new results to review at this time.  Family Communication: None at bedside  Disposition: Status is: Inpatient Remains inpatient appropriate because: Management of left hip/femoral neck fracture  Planned Discharge Destination: Skilled nursing facility, likely   DVT prophylaxis-Lovenox (1 dose given today.  Hold after that for planned surgery tomorrow) Time spent: 35 minutes  Author: Silvano Bilis, MD 06/03/2023 10:17 AM  For on call review [email or web address].

## 2023-06-03 NOTE — Progress Notes (Signed)
Occupational Therapy Evaluation Patient Details Name: [name] MRN: [id number] DOB: [birth year] Today's Date: 06/03/2023   History of Present Illness Pt is a 67 y.o. female with PMH consisting of HTN, CAD, CABG, CVA (L sided symptoms), type 2 DM. Pt admitted to ED (6/17) with left hip pain following a mechanical fall at work. MD assessment includes: closed left femoral neck fx. Pt is now s/p left hip hemiarthroplasty (6/19).   Clinical Impression   Pt received upright in recliner, agreeable to OT evaluation. Education provided on posterior hip precautions start of session, provided with handout of precautions + recommended AE for ADLs. Pt with increased hesitancy and anxiety with any movement of LLE due to pain concerns. Patient requires assistance with bathing but otherwise IND with ADL performance PTA. Recent mechanical fall at work leading to hospitalization and surgery 6/16. Patient currently functioning below baseline (see problems list for further details). Pt refusing transfer back to bed at this time, verbalizes that she would prefer +2 assist due to her decreased mobility, prior CVA deficits of residual weakness L side, and levels of pain. Transfer deferred at this time. Pt provided education on dressing strategies for compensation while recovering from hemiarthroplasty with pt return demonstrating use of sock aid with [facility] after instruction. Pt is anticipated to require setup for UB dressing, minA for bathing and mod-maxA for LB ADLs. Patient will benefit from acute OT to increase overall independence in the areas of ADLs, functional mobility, decrease caregiver burden and minimize the risk of falls in order to safely discharge.       Recommendations for follow up therapy are one component of a multi-disciplinary discharge planning process, led by the attending physician.  Recommendations may be updated based on patient status, additional functional criteria and insurance authorization.    Assistance Recommended at Discharge Intermittent Supervision/Assistance  Patient can return home with the following A lot of help with walking and/or transfers;A lot of help with bathing/dressing/bathroom;Assistance with cooking/housework;Direct supervision/assist for medications management;Direct supervision/assist for financial management;Help with stairs or ramp for entrance    Functional Status Assessment  Patient has had a recent decline in their functional status and demonstrates the ability to make significant improvements in function in a reasonable and predictable amount of time.  Equipment Recommendations  BSC/3in1       Precautions / Restrictions Precautions Precautions: Posterior Hip;Fall Precaution Booklet Issued: Yes (comment) Required Braces or Orthoses: Other Brace Other Brace: Hip abduction brace Restrictions Weight Bearing Restrictions: Yes LLE Weight Bearing: Weight bearing as tolerated      Mobility Bed Mobility                    Transfers                          Balance Overall balance assessment: Needs assistance Sitting-balance support: Feet supported, Single extremity supported Sitting balance-Leahy Scale: Fair                                     ADL either performed or assessed with clinical judgement   ADL Overall ADL's : Needs assistance/impaired Eating/Feeding: Set up   Grooming: Set up;Brushing hair               Lower Body Dressing: Cueing for safety;Cueing for sequencing;Cueing for compensatory techniques;Adhering to hip precautions;Minimal assistance Lower Body Dressing Details (indicate cue type  and reason): Edu on AE (sock aide, reacher, LH sponge)             Functional mobility during ADLs:  (Pt declining functional transfer or further mobility at this time; pt apprehensive with movement d/t pain levels, verbalizes that she would like +2 to assist for transfers. Offered sliding board  transfer with pt declining.) General ADL Comments: Pt able to perform RLE sock donning with sock aid after instruction seated in recliner with MIN A. Anticipate pt would require setup for UB dressing,  minA for bathing, modA to maxA for LB ADLs d/t pain and mobility limitations.      Pertinent Vitals/Pain Pain Assessment Pain Assessment: Faces Pain Score: 7  Faces Pain Scale: Hurts even more Pain Location: L hip Pain Descriptors / Indicators: Aching, Grimacing, Guarding Pain Intervention(s): Limited activity within patient's tolerance     Hand Dominance Right   Extremity/Trunk Assessment Upper Extremity Assessment Upper Extremity Assessment: Overall WFL for tasks assessed   Lower Extremity Assessment Lower Extremity Assessment: LLE deficits/detail;Generalized weakness (LLE WBAT)       Communication Communication Communication: No difficulties   Cognition Arousal/Alertness: Awake/alert Behavior During Therapy: WFL for tasks assessed/performed Overall Cognitive Status: Within Functional Limits for tasks assessed                                       General Comments  Pt has decreased awareness L side due to prior CVA, reports decreased sensation and proprioception which further contributes to her anxiety for mobility    Exercises Other Exercises Other Exercises: Posterior hip precaution education (Pt verbalizing with 1 vc)        Home Living Family/patient expects to be discharged to:: Private residence Living Arrangements: Alone Available Help at Discharge: Family;Available PRN/intermittently Type of Home: House Home Access: Stairs to enter [employer] of Steps: 3 Entrance Stairs-Rails: Right Home Layout: One level     Bathroom Shower/Tub: Chief Strategy Officer: Standard Bathroom Accessibility: Yes   Home Equipment: Agricultural consultant (2 wheels);Cane - quad;Shower seat          Prior Functioning/Environment Prior Level  of Function : Needs assist       Physical Assist : ADLs (physical)   ADLs (physical): Bathing Mobility Comments: Daughter assists with showering. Used quad cane sparingly, limited community ambulator. Only 1 fall in last 6 months which was the one resulting in her L hip fx, reported tripping at work. ADLs Comments: Independent with most ADL's except showering/bathing. Independent with IADL's, transportation.        OT Problem List: Decreased strength;Decreased range of motion;Decreased activity tolerance;Impaired balance (sitting and/or standing);Decreased safety awareness;Decreased knowledge of use of DME or AE;Decreased knowledge of precautions;Pain      OT Treatment/Interventions: Self-care/ADL training;Therapeutic exercise;DME and/or AE instruction;Therapeutic activities;Patient/family education;Balance training    OT Goals(Current goals can be found in the care plan section) Acute Rehab OT Goals Patient Stated Goal: To get stronger OT Goal Formulation: With patient Time For Goal Achievement: 06/17/23 Potential to Achieve Goals: Good  OT Frequency: Min 1X/week       AM-PAC OT "6 Clicks" Daily Activity     Outcome Measure Help from another person eating meals?: None Help from another person taking care of personal grooming?: None Help from another person toileting, which includes using toliet, bedpan, or urinal?: A Lot Help from another person bathing (including washing, rinsing, drying)?:  A Lot Help from another person to put on and taking off regular upper body clothing?: A Little Help from another person to put on and taking off regular lower body clothing?: A Lot 6 Click Score: 17   End of Session Equipment Utilized During Treatment: Other (comment) (AE (sock aide, handout on precautions and AE s/p surgery))  Activity Tolerance: Patient limited by pain;Patient limited by fatigue Patient left: in chair;with call bell/phone within reach;with chair alarm set  OT Visit  Diagnosis: Muscle weakness (generalized) (M62.81);Other abnormalities of gait and mobility (R26.89);Unsteadiness on feet (R26.81)                Time: 4098-1191 OT Time Calculation (min): 30 min Charges:  OT General Charges $OT Visit: 1 Visit OT Evaluation $OT Eval Moderate Complexity: 1 Mod OT Treatments $Self Care/Home Management : 8-22 mins   Sevag Shearn L. Hayzen Lorenson, OTR/L  06/03/23, 2:53 PM

## 2023-06-03 NOTE — Inpatient Diabetes Management (Signed)
Inpatient Diabetes Program Recommendations  AACE/ADA: New Consensus Statement on Inpatient Glycemic Control   Target Ranges:  Prepandial:   less than 140 mg/dL      Peak postprandial:   less than 180 mg/dL (1-2 hours)      Critically ill patients:  140 - 180 mg/dL    Latest Reference Range & Units 06/03/23 07:39  Glucose-Capillary 70 - 99 mg/dL 161 (H)    Latest Reference Range & Units 06/02/23 06:43 06/02/23 09:44 06/02/23 12:00 06/02/23 16:16 06/02/23 17:29 06/02/23 21:38  Glucose-Capillary 70 - 99 mg/dL 096 (H) 045 (H) 409 (H) 116 (H) 132 (H) 282 (H)   Review of Glycemic Control  Diabetes history: DM2 Outpatient Diabetes medications: Glipizide 10 mg BID, Metformin 1000 mg BID, Jardiance 10 mg daily Current orders for Inpatient glycemic control: Semglee 15 units daily, Novolog 0-9 units QID, Jardiance 10 mg daily  Inpatient Diabetes Program Recommendations:    Insulin: Per MAR, Semglee was NOT GIVEN on 06/02/23. Patient received Decadron 5 mg at 15:29 on 06/02/23. CBG 315 mg/dl this morning due to not getting any basal insulin and due to Decadron given on 06/02/23. Recommend to continue Semglee 15 units daily and consider adding Novolog 3 units TID with meals for meal coverage if patient eats at least 50% of meals.   Thanks, Orlando Penner, RN, MSN, CDCES Diabetes Coordinator Inpatient Diabetes Program [phone] (Team Pager from 8am to 5pm)

## 2023-06-03 NOTE — TOC Progression Note (Signed)
Transition of Care [facility]) - CM/SW Discharge Note   Patient Details  Name: [name] MRN: [id number] Date of Birth: [birth year]  [facility] CM/SW Contact:  Marlowe Sax, RN Phone Number: 06/03/2023, 9:17 AM   Clinical Narrative:    Patient comes from home, Had surgery for Hip fracture yesterday, PT and OT to evaluate and make recommendation TPC to assist with DC plan and need  If needs to go to SNF Ins approval for SNF and EMS will need to be obtained   Awaiting evaluation from therapy  Final next level of care:  (TBD) Barriers to Discharge: Continued Medical Work up   Patient Goals and CMS Choice      Discharge Placement                         Discharge Plan and Services Additional resources added to the After Visit Summary for     Discharge Planning Services: CM Consult                                 Social Determinants of Health (SDOH) Interventions SDOH Screenings   Food Insecurity: No Food Insecurity (06/01/2023)  Housing: Low Risk  (06/01/2023)  Transportation Needs: No Transportation Needs (06/01/2023)  Utilities: Not At Risk (06/01/2023)  Alcohol Screen: Low Risk  (04/28/2023)  Depression (PHQ2-9): Low Risk  (04/28/2023)  Recent Concern: Depression (PHQ2-9) - High Risk (02/10/2023)  Financial Resource Strain: Low Risk  (04/28/2023)  Physical Activity: Inactive (04/28/2023)  Social Connections: Socially Isolated (04/28/2023)  Stress: Stress Concern Present (04/28/2023)  Tobacco Use: Medium Risk (06/02/2023)     Readmission Risk Interventions    08/11/2021    1:30 PM  Readmission Risk Prevention Plan  Transportation Screening Complete  HRI or Home Care Consult Complete  Social Work Consult for Recovery Care Planning/Counseling Complete  Palliative Care Screening Not Applicable  Medication Review Oceanographer) Complete

## 2023-06-03 NOTE — Plan of Care (Signed)

## 2023-06-03 NOTE — Progress Notes (Addendum)
Physical Therapy Treatment Patient Details Name: [name] MRN: [id number] DOB: 1956-10-12 Today's Date: 06/03/2023   History of Present Illness Pt is a 67 y.o. female with PMH consisting of HTN, CAD, CABG, CVA (L sided symptoms), type 2 DM. Pt admitted to ED (6/17) with left hip pain following a mechanical fall at work. MD assessment includes: closed left femoral neck fx. Pt is now s/p left hip hemiarthroplasty (6/19).    PT Comments    Pt pleasant and motivated for therapy, sitting in recliner upon arrival. Pt reported feeling dizzy at start of session but vitals were WNL and remained stable throughout the session, with her BP in sitting at 146/68, O2 at 94%, and HR at 78. No major changes in her mobility status since this morning other than needing some extra assistance with STS, secondary to standing from recliner instead of elevated bed height. She continues to demonstrate hesitancy to fully WB on the LLE with ambulation. She was able to walk ~5 feet in the room with small, shuffling steps and heavy use of UE's on RW, VC's for hip precaution compliance. Pt able to remember posterior hip precautions. Pt will benefit from continued PT services upon discharge to safely address deficits listed in patient problem list for decreased caregiver assistance and eventual return to PLOF.   Recommendations for follow up therapy are one component of a multi-disciplinary discharge planning process, led by the attending physician.  Recommendations may be updated based on patient status, additional functional criteria and insurance authorization.  Follow Up Recommendations  Can patient physically be transported by private vehicle: No    Assistance Recommended at Discharge Intermittent Supervision/Assistance  Patient can return home with the following A little help with walking and/or transfers;Help with stairs or ramp for entrance;Assist for transportation;A little help with  bathing/dressing/bathroom;Assistance with cooking/housework   Equipment Recommendations  BSC/3in1    Recommendations for Other Services       Precautions / Restrictions Precautions Precautions: Posterior Hip;Fall Precaution Booklet Issued: Yes (comment) Required Braces or Orthoses: Other Brace Other Brace: Hip abduction brace Restrictions Weight Bearing Restrictions: Yes LLE Weight Bearing: Weight bearing as tolerated     Mobility  Bed Mobility Overal bed mobility: Needs Assistance Bed Mobility: Sit to Supine     Supine to sit: Min assist Sit to supine: Min assist   General bed mobility comments: Min A for trunk control and LLE placement. Increased time/effort to complete sit > supine, pt had to take a couple breaks secondary to pain.    Transfers Overall transfer level: Needs assistance Equipment used: Rolling walker (2 wheels) Transfers: Sit to/from Stand Sit to Stand: Mod assist, +2 physical assistance           General transfer comment: Mod A + 2 for elevation from recliner. VC's for correct UE/LE placement and hip precaution compliance. Pt continued to struggle with eccentric control upon sitting with high anxiety when sitting down. Pt's left hand slipped off arm rest during initial STS and she requested a glove to prevent slipping.    Ambulation/Gait Ambulation/Gait assistance: Min assist Gait Distance (Feet): 5 Feet Assistive device: Rolling walker (2 wheels) Gait Pattern/deviations: Step-to pattern, Antalgic, Decreased stance time - left, Decreased weight shift to left, Shuffle Gait velocity: decreased     General Gait Details: Pt able to ambulate ~5 feet in room with RW and min A for stability. Continued difficulty putting weight through LLE. Significant use of UE's on RW. Pt opted to sit after ~5 feet  even after encouragement/cueing from therapist, likely secondary to pain and anxiety with movement.   Stairs             Wheelchair Mobility     Modified Rankin (Stroke Patients Only)       Balance Overall balance assessment: Needs assistance Sitting-balance support: Feet supported, Single extremity supported Sitting balance-Leahy Scale: Fair     Standing balance support: Bilateral upper extremity supported, During functional activity, Reliant on assistive device for balance Standing balance-Leahy Scale: Fair Standing balance comment: Pt able to stand EOB with BUE on RW with no LOB                            Cognition Arousal/Alertness: Awake/alert Behavior During Therapy: WFL for tasks assessed/performed Overall Cognitive Status: Within Functional Limits for tasks assessed                                          Exercises Total Joint Exercises Quad Sets: Strengthening, Left, 10 reps, Supine Heel Slides: AROM, 10 reps, Supine, Left Other Exercises: Posterior hip precaution education    General Comments General comments (skin integrity, edema, etc.): Pt has decreased awareness L side due to prior CVA, reports decreased sensation and proprioception which further contributes to her anxiety for mobility      Pertinent Vitals/Pain Pain Assessment Pain Assessment: 0-10 Pain Score: 6  Pain Location: L hip Pain Intervention(s): RN gave pain meds during session, Monitored during session, Repositioned    Home Living Family/patient expects to be discharged to:: Private residence Living Arrangements: Alone Available Help at Discharge: Family;Available PRN/intermittently Type of Home: House Home Access: Stairs to enter Entrance Stairs-Rails: Right Entrance Stairs-Number of Steps: 3   Home Layout: One level Home Equipment: Agricultural consultant (2 wheels);Cane - quad;Shower seat      Prior Function            PT Goals (current goals can now be found in the care plan section) Acute Rehab PT Goals Patient Stated Goal: Walk better to go back to work PT Goal Formulation: With patient Time  For Goal Achievement: 06/16/23 Potential to Achieve Goals: Fair Progress towards PT goals: Progressing toward goals    Frequency    BID      PT Plan Current plan remains appropriate    Co-evaluation              AM-PAC PT "6 Clicks" Mobility   Outcome Measure  Help needed turning from your back to your side while in a flat bed without using bedrails?: A Little Help needed moving from lying on your back to sitting on the side of a flat bed without using bedrails?: A Little Help needed moving to and from a bed to a chair (including a wheelchair)?: A Lot Help needed standing up from a chair using your arms (e.g., wheelchair or bedside chair)?: A Lot Help needed to walk in hospital room?: A Lot Help needed climbing 3-5 steps with a railing? : Total 6 Click Score: 13    End of Session Equipment Utilized During Treatment: Gait belt Activity Tolerance: Patient limited by pain Patient left: in bed;with bed alarm set;with call bell/phone within reach Nurse Communication: Mobility status;Precautions;Weight bearing status;Other (comment) (Pt needed new purewick and wanted more water, nursing notified) PT Visit Diagnosis: Other abnormalities of gait and mobility (R26.89);Muscle weakness (  generalized) (M62.81);Pain;Unsteadiness on feet (R26.81) Pain - Right/Left: Left Pain - part of body: Hip     Time: 4540-9811 PT Time Calculation (min) (ACUTE ONLY): 41 min  Charges:  $Gait Training: 8-22 mins $Therapeutic Exercise: 8-22 mins $Therapeutic Activity: 23-37 mins                     Timothy Crutchfield, SPT 06/03/23, 4:39 PM This entire session was performed under direct supervision and direction of a licensed therapist/therapist assistant. I have personally read, edited and approve of the note as written. Loran Senters, DPT

## 2023-06-03 NOTE — Care Management Important Message (Signed)
Important Message  Patient Details  Name: [name] MRN: [id number] Date of Birth: 1956/03/17   Medicare Important Message Given:  N/A - LOS <3 / Initial given by admissions     Olegario Messier A Kanda Deluna 06/03/2023, 8:38 AM

## 2023-06-04 ENCOUNTER — Inpatient Hospital Stay: Payer: PPO

## 2023-06-04 LAB — GLUCOSE, CAPILLARY
Glucose-Capillary: 135 mg/dL — ABNORMAL HIGH (ref 70–99)
Glucose-Capillary: 158 mg/dL — ABNORMAL HIGH (ref 70–99)
Glucose-Capillary: 175 mg/dL — ABNORMAL HIGH (ref 70–99)
Glucose-Capillary: 179 mg/dL — ABNORMAL HIGH (ref 70–99)
Glucose-Capillary: 195 mg/dL — ABNORMAL HIGH (ref 70–99)
Glucose-Capillary: 235 mg/dL — ABNORMAL HIGH (ref 70–99)

## 2023-06-04 LAB — CBC
HCT: 32.9 % — ABNORMAL LOW (ref 36.0–46.0)
Hemoglobin: 10.8 g/dL — ABNORMAL LOW (ref 12.0–15.0)
MCH: 28.3 pg (ref 26.0–34.0)
MCHC: 32.8 g/dL (ref 30.0–36.0)
MCV: 86.4 fL (ref 80.0–100.0)
Platelets: 218 10*3/uL (ref 150–400)
RBC: 3.81 MIL/uL — ABNORMAL LOW (ref 3.87–5.11)
RDW: 12.6 % (ref 11.5–15.5)
WBC: 9.7 10*3/uL (ref 4.0–10.5)
nRBC: 0 % (ref 0.0–0.2)

## 2023-06-04 LAB — BASIC METABOLIC PANEL
Anion gap: 5 (ref 5–15)
BUN: 32 mg/dL — ABNORMAL HIGH (ref 8–23)
CO2: 29 mmol/L (ref 22–32)
Calcium: 8.1 mg/dL — ABNORMAL LOW (ref 8.9–10.3)
Chloride: 98 mmol/L (ref 98–111)
Creatinine, Ser: 0.96 mg/dL (ref 0.44–1.00)
GFR, Estimated: >60 mL/min (ref >60)
Glucose, Bld: 208 mg/dL — ABNORMAL HIGH (ref 70–99)
Potassium: 4.2 mmol/L (ref 3.5–5.1)
Sodium: 132 mmol/L — ABNORMAL LOW (ref 135–145)

## 2023-06-04 LAB — SURGICAL PATHOLOGY

## 2023-06-04 MED ORDER — INSULIN ASPART 100 UNIT/ML IJ SOLN
0.0000 [IU] | Freq: Three times a day (TID) | INTRAMUSCULAR | Status: DC
  Administered 2023-06-04: 1 [IU] via SUBCUTANEOUS
  Administered 2023-06-04: 2 [IU] via SUBCUTANEOUS
  Administered 2023-06-05: 3 [IU] via SUBCUTANEOUS
  Administered 2023-06-05: 1 [IU] via SUBCUTANEOUS
  Administered 2023-06-05 – 2023-06-07 (×5): 2 [IU] via SUBCUTANEOUS
  Administered 2023-06-07: 3 [IU] via SUBCUTANEOUS
  Administered 2023-06-08 (×2): 2 [IU] via SUBCUTANEOUS
  Administered 2023-06-09: 1 [IU] via SUBCUTANEOUS
  Administered 2023-06-10: 2 [IU] via SUBCUTANEOUS
  Administered 2023-06-10: 1 [IU] via SUBCUTANEOUS
  Administered 2023-06-10 – 2023-06-11 (×3): 2 [IU] via SUBCUTANEOUS
  Filled 2023-06-04 (×18): qty 1

## 2023-06-04 MED ORDER — POLYETHYLENE GLYCOL 3350 17 G PO PACK
17.0000 g | PACK | Freq: Once | ORAL | Status: AC
  Administered 2023-06-04: 17 g via ORAL
  Filled 2023-06-04: qty 1

## 2023-06-04 MED ORDER — POLYETHYLENE GLYCOL 3350 17 G PO PACK
34.0000 g | PACK | Freq: Every day | ORAL | Status: DC
  Administered 2023-06-05 – 2023-06-11 (×6): 34 g via ORAL
  Filled 2023-06-04 (×7): qty 2

## 2023-06-04 NOTE — Progress Notes (Addendum)
Subjective:  POD #3 s/p left hip hemiarthroplasty.   Patient reports left hip pain as mild at rest.  Patient states that she was lethargic today and not able to do much physical therapy.  PT note states that the patient was desaturating without supplemental O2.  She is alert and talkative this afternoon.  Objective:   VITALS:   Vitals:   06/03/23 1602 06/04/23 0050 06/04/23 0802 06/04/23 1537  BP: 130/73 113/84 135/64 (!) 141/60  Pulse: 79 73 81 88  Resp: 13 17 16 16   Temp: 98.1 F (36.7 C) 98.3 F (36.8 C) 99.1 F (37.3 C) 98.9 F (37.2 C)  TempSrc: Oral     SpO2: 93% 90% 95% (!) 89%  Weight:      Height:        PHYSICAL EXAM: Left lower extremity Neurovascular intact Sensation intact distally Intact pulses distally Dorsiflexion/Plantar flexion intact Incision: dressing C/D/I No cellulitis present Compartment soft  LABS  Results for orders placed or performed during the hospital encounter of 05/31/23 (from the past 24 hour(s))  Glucose, capillary     Status: Abnormal   Collection Time: 06/03/23  5:33 PM  Result Value Ref Range   Glucose-Capillary 191 (H) 70 - 99 mg/dL  Glucose, capillary     Status: Abnormal   Collection Time: 06/03/23 10:04 PM  Result Value Ref Range   Glucose-Capillary 160 (H) 70 - 99 mg/dL  Glucose, capillary     Status: Abnormal   Collection Time: 06/04/23 12:32 AM  Result Value Ref Range   Glucose-Capillary 175 (H) 70 - 99 mg/dL  CBC     Status: Abnormal   Collection Time: 06/04/23  4:33 AM  Result Value Ref Range   WBC 9.7 4.0 - 10.5 K/uL   RBC 3.81 (L) 3.87 - 5.11 MIL/uL   Hemoglobin 10.8 (L) 12.0 - 15.0 g/dL   HCT 16.1 (L) 09.6 - 04.5 %   MCV 86.4 80.0 - 100.0 fL   MCH 28.3 26.0 - 34.0 pg   MCHC 32.8 30.0 - 36.0 g/dL   RDW 40.9 81.1 - 91.4 %   Platelets 218 150 - 400 K/uL   nRBC 0.0 0.0 - 0.2 %  Basic metabolic panel     Status: Abnormal   Collection Time: 06/04/23  4:33 AM  Result Value Ref Range   Sodium 132 (L) 135 - 145  mmol/L   Potassium 4.2 3.5 - 5.1 mmol/L   Chloride 98 98 - 111 mmol/L   CO2 29 22 - 32 mmol/L   Glucose, Bld 208 (H) 70 - 99 mg/dL   BUN 32 (H) 8 - 23 mg/dL   Creatinine, Ser 7.82 0.44 - 1.00 mg/dL   Calcium 8.1 (L) 8.9 - 10.3 mg/dL   GFR, Estimated >95 >62 mL/min   Anion gap 5 5 - 15  Glucose, capillary     Status: Abnormal   Collection Time: 06/04/23  5:44 AM  Result Value Ref Range   Glucose-Capillary 195 (H) 70 - 99 mg/dL  Glucose, capillary     Status: Abnormal   Collection Time: 06/04/23  8:06 AM  Result Value Ref Range   Glucose-Capillary 235 (H) 70 - 99 mg/dL  Glucose, capillary     Status: Abnormal   Collection Time: 06/04/23 11:51 AM  Result Value Ref Range   Glucose-Capillary 179 (H) 70 - 99 mg/dL    DG Chest Port 1 View  Result Date: 06/04/2023 CLINICAL DATA:  Hypoxia, recent hip arthroplasty EXAM: PORTABLE CHEST  1 VIEW COMPARISON:  05/31/2023 FINDINGS: Single frontal view of the chest demonstrates stable postsurgical changes from CABG. Cardiac silhouette is unremarkable. No airspace disease, effusion, or pneumothorax. No acute bony abnormality. IMPRESSION: 1. No acute intrathoracic process. Electronically Signed   By: Sharlet Salina M.D.   On: 06/04/2023 14:02   DG Hip Port Unilat With Pelvis 1V Left  Result Date: 06/02/2023 CLINICAL DATA:  Hip fracture. Postoperative for left hip arthroplasty. EXAM: DG HIP (WITH OR WITHOUT PELVIS) 1V PORT LEFT COMPARISON:  05/31/2023 FINDINGS: Interval left hip hemiarthroplasty noted. No periprosthetic fracture or acute complicating feature. There a right-sided (contralateral) total hip prosthesis. IMPRESSION: 1. Interval left hip hemiarthroplasty without complicating feature. Electronically Signed   By: Gaylyn Rong M.D.   On: 06/02/2023 16:44    Assessment/Plan: 2 Days Post-Op   Principal Problem:   Closed left hip fracture (HCC) Active Problems:   Anxiety and depression   Hypertensive urgency   History of CVA with  residual deficit   Hyponatremia   S/P CABG x 4   Uncontrolled type 2 diabetes mellitus with hyperglycemia, without long-term current use of insulin (HCC)   Dyslipidemia  Patient is stable postop.  Hemoglobin remains within acceptable range.  Blood sugar this morning was 208.  Patient will continue with physical therapy for gait training and lower extremity strengthening.  She is on posterior hip precautions.  Patient will need a skilled nursing facility upon discharge.  Continue diabetes management as ordered.  Patient may follow-up in the [facility] office in 10 to 14 days after discharge.  Patient should continue Lovenox 40 mg daily until follow-up.    Juanell Fairly , MD 06/04/2023, 3:50 PM

## 2023-06-04 NOTE — TOC Progression Note (Signed)
Transition of Care [facility]) - Progression Note    Patient Details  Name: [name] MRN: [id number] Date of Birth: 1956/06/17  Transition of [facility]) CM/SW Contact  Marlowe Sax, RN Phone Number: 06/04/2023, 9:16 AM  Clinical Narrative:     PASSR obtained [id number] A Bedsearch sent, Fl2 completed Patient agreeable to go to [facility]  Expected Discharge Plan:  (TBD) Barriers to Discharge: Continued Medical Work up  Expected Discharge Plan and Services   Discharge Planning Services: CM Consult   Living arrangements for the past 2 months: Single Family Home                                       Social Determinants of Health (SDOH) Interventions SDOH Screenings   Food Insecurity: No Food Insecurity (06/01/2023)  Housing: Low Risk  (06/01/2023)  Transportation Needs: No Transportation Needs (06/01/2023)  Utilities: Not At Risk (06/01/2023)  Alcohol Screen: Low Risk  (04/28/2023)  Depression (PHQ2-9): Low Risk  (04/28/2023)  Recent Concern: Depression (PHQ2-9) - High Risk (02/10/2023)  Financial Resource Strain: Low Risk  (04/28/2023)  Physical Activity: Inactive (04/28/2023)  Social Connections: Socially Isolated (04/28/2023)  Stress: Stress Concern Present (04/28/2023)  Tobacco Use: Medium Risk (06/03/2023)    Readmission Risk Interventions    08/11/2021    1:30 PM  Readmission Risk Prevention Plan  Transportation Screening Complete  HRI or Home Care Consult Complete  Social Work Consult for Recovery Care Planning/Counseling Complete  Palliative Care Screening Not Applicable  Medication Review Oceanographer) Complete

## 2023-06-04 NOTE — NC FL2 (Signed)
[place] MEDICAID FL2 LEVEL OF CARE FORM     IDENTIFICATION  Patient Name: [name] Birthdate: 1956-09-04 Sex: female Admission Date (Current Location): 05/31/2023  [facility] and [place] Number:  Chiropodist and Address:  [facility], [address] [place], [place] [address]      Provider Number: [id number]  Attending Physician Name and Address:  Kathrynn Running, MD  Relative Name and Phone Number:  Belenda Cruise Daughter [phone]    Current Level of Care: Hospital Recommended Level of Care: Skilled Nursing Facility Prior Approval Number:    Date Approved/Denied:   PASRR Number: [id number]  Discharge Plan: SNF    Current Diagnoses: Patient Active Problem List   Diagnosis Date Noted   Closed left hip fracture (HCC) 05/31/2023   Uncontrolled type 2 diabetes mellitus with hyperglycemia, without long-term current use of insulin (HCC) 05/31/2023   Dyslipidemia 05/31/2023   Congestion of nasal sinus 12/04/2022   COVID 12/04/2022   Excessive cerumen in both ear canals 10/16/2022   Hyperlipidemia associated with type 2 diabetes mellitus (HCC) 10/16/2022   Hypertension associated with diabetes (HCC) 10/16/2022   Colon cancer screening    Subcutaneous nodule    Diabetic peripheral neuropathy (HCC)    Debility 08/11/2021   S/P CABG x 4 08/04/2021   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   Hyperkalemia    Chronic bilateral low back pain without sciatica    Leukocytosis    Hyponatremia    Slow transit constipation    Hemiparesis affecting left side as late effect of stroke (HCC)    History of CVA with residual deficit 04/19/2021   Dyslipidemia, goal LDL below 70 04/18/2021   Hypertensive urgency 04/18/2021   Recurrent major depressive disorder, in partial remission (HCC) 09/20/2020   Status post total replacement of hip 04/04/2018   Primary osteoarthritis of right hip 02/20/2018   Allergic rhinitis 10/28/2015   Acute  stress disorder 08/01/2015   Anxiety 08/01/2015   Anxiety and depression 08/01/2015   Diabetes mellitus with coincident hypertension (HCC) 08/01/2015   Diverticulosis of colon 08/01/2015   Generalized pruritus 08/01/2015   Cannot sleep 08/01/2015   Adiposity 08/01/2015   Detrusor muscle hypertonia 08/01/2015   Hypercholesterolemia without hypertriglyceridemia 08/01/2015   Female stress incontinence 08/01/2015   Avitaminosis D 08/01/2015    Orientation RESPIRATION BLADDER Height & Weight     Self, Time, Situation, Place  Normal Continent, External catheter Weight: 79.4 kg Height:  5\' 7"  (170.2 cm)  BEHAVIORAL SYMPTOMS/MOOD NEUROLOGICAL BOWEL NUTRITION STATUS      Continent Diet (See DC summary)  AMBULATORY STATUS COMMUNICATION OF NEEDS Skin   Extensive Assist Verbally Normal, Surgical wounds                       Personal Care Assistance Level of Assistance  Bathing, Feeding, Dressing Bathing Assistance: Limited assistance Feeding assistance: Independent Dressing Assistance: Limited assistance     Functional Limitations Info  Sight, Hearing, Speech Sight Info: Adequate Hearing Info: Adequate Speech Info: Adequate    SPECIAL CARE FACTORS FREQUENCY  PT (By licensed PT), OT (By licensed OT)     PT Frequency: 5 times per week OT Frequency: 5 times per week            Contractures Contractures Info: Not present    Additional Factors Info  Code Status, Allergies Code Status Info: full code Allergies Info: Jardiance, Pencillin           Current  Medications (06/04/2023):  This is the current hospital active medication list Current Facility-Administered Medications  Medication Dose Route Frequency Provider Last Rate Last Admin   acetaminophen (TYLENOL) tablet 650 mg  650 mg Oral Q4H PRN Juanell Fairly, MD       ALPRAZolam Prudy Feeler) tablet 0.5 mg  0.5 mg Oral BID PRN Juanell Fairly, MD   0.5 mg at 06/01/23 0138   amLODipine (NORVASC) tablet 10 mg  10 mg  Oral Daily Juanell Fairly, MD   10 mg at 06/04/23 2956   aspirin EC tablet 81 mg  81 mg Oral Daily Kathrynn Running, MD   81 mg at 06/04/23 2130   bisacodyl (DULCOLAX) suppository 10 mg  10 mg Rectal Daily PRN Juanell Fairly, MD       docusate sodium (COLACE) capsule 100 mg  100 mg Oral BID Juanell Fairly, MD   100 mg at 06/04/23 8657   empagliflozin (JARDIANCE) tablet 10 mg  10 mg Oral QAC breakfast Juanell Fairly, MD   10 mg at 06/04/23 0823   enoxaparin (LOVENOX) injection 40 mg  40 mg Subcutaneous Q24H Juanell Fairly, MD   40 mg at 06/04/23 8469   feeding supplement (GLUCERNA SHAKE) (GLUCERNA SHAKE) liquid 237 mL  237 mL Oral BID BM Juanell Fairly, MD   237 mL at 06/03/23 1734   insulin aspart (novoLOG) injection 0-9 Units  0-9 Units Subcutaneous TID WC Wouk, Wilfred Curtis, MD       insulin aspart (novoLOG) injection 3 Units  3 Units Subcutaneous TID WC Kathrynn Running, MD   3 Units at 06/04/23 0823   insulin glargine-yfgn (SEMGLEE) injection 15 Units  15 Units Subcutaneous Daily Juanell Fairly, MD   15 Units at 06/03/23 1017   labetalol (NORMODYNE) injection 5 mg  5 mg Intravenous Q2H PRN Juanell Fairly, MD       latanoprost (XALATAN) 0.005 % ophthalmic solution 1 drop  1 drop Left Eye Gwynneth Aliment, MD   1 drop at 06/03/23 2222   losartan (COZAAR) tablet 100 mg  100 mg Oral Daily Juanell Fairly, MD   100 mg at 06/04/23 6295   magnesium hydroxide (MILK OF MAGNESIA) suspension 30 mL  30 mL Oral Daily PRN Juanell Fairly, MD   30 mL at 06/03/23 1013   menthol-cetylpyridinium (CEPACOL) lozenge 3 mg  1 lozenge Oral PRN Juanell Fairly, MD       Or   phenol (CHLORASEPTIC) mouth spray 1 spray  1 spray Mouth/Throat PRN Juanell Fairly, MD       methocarbamol (ROBAXIN) tablet 500 mg  500 mg Oral Q6H PRN Juanell Fairly, MD   500 mg at 06/03/23 1734   Or   methocarbamol (ROBAXIN) 500 mg in dextrose 5 % 50 mL IVPB  500 mg Intravenous Q6H PRN Juanell Fairly, MD        montelukast (SINGULAIR) tablet 10 mg  10 mg Oral QHS Juanell Fairly, MD   10 mg at 06/03/23 2215   morphine (PF) 2 MG/ML injection 2 mg  2 mg Intravenous Q2H PRN Juanell Fairly, MD       multivitamin with minerals tablet 1 tablet  1 tablet Oral Daily Juanell Fairly, MD   1 tablet at 06/04/23 0823   ondansetron (ZOFRAN) tablet 4 mg  4 mg Oral Q6H PRN Juanell Fairly, MD       Or   ondansetron [facility]) injection 4 mg  4 mg Intravenous Q6H PRN Juanell Fairly, MD       oxybutynin (  DITROPAN-XL) 24 hr tablet 10 mg  10 mg Oral Daily Juanell Fairly, MD   10 mg at 06/04/23 1610   oxyCODONE (Oxy IR/ROXICODONE) immediate release tablet 10-15 mg  10-15 mg Oral Q4H PRN Juanell Fairly, MD       oxyCODONE (Oxy IR/ROXICODONE) immediate release tablet 5-10 mg  5-10 mg Oral Q4H PRN Juanell Fairly, MD   10 mg at 06/03/23 2215   polyethylene glycol (MIRALAX / GLYCOLAX) packet 17 g  17 g Oral Daily PRN Juanell Fairly, MD       polyethylene glycol (MIRALAX / GLYCOLAX) packet 17 g  17 g Oral Daily Kathrynn Running, MD   17 g at 06/04/23 9604   rosuvastatin (CRESTOR) tablet 40 mg  40 mg Oral Daily Juanell Fairly, MD   40 mg at 06/04/23 5409   senna (SENOKOT) tablet 8.6 mg  1 tablet Oral BID Juanell Fairly, MD   8.6 mg at 06/04/23 8119   sertraline (ZOLOFT) tablet 25 mg  25 mg Oral QHS Juanell Fairly, MD   25 mg at 06/03/23 2215   traMADol (ULTRAM) tablet 50 mg  50 mg Oral Q6H Juanell Fairly, MD   50 mg at 06/04/23 0546   traZODone (DESYREL) tablet 25 mg  25 mg Oral QHS PRN Juanell Fairly, MD   25 mg at 06/03/23 2222     Discharge Medications: Please see discharge summary for a list of discharge medications.  Relevant Imaging Results:  Relevant Lab Results:   Additional Information SSN [id number]  Marlowe Sax, RN

## 2023-06-04 NOTE — Plan of Care (Signed)
  Problem: Education: Goal: Knowledge of General Education information will improve Description: Including pain rating scale, medication(s)/side effects and non-pharmacologic comfort measures Outcome: Progressing   Problem: Health Behavior/Discharge Planning: Goal: Ability to manage health-related needs will improve Outcome: Progressing   Problem: Clinical Measurements: Goal: Diagnostic test results will improve Outcome: Progressing Goal: Cardiovascular complication will be avoided Outcome: Progressing   Problem: Activity: Goal: Risk for activity intolerance will decrease Outcome: Progressing   Problem: Nutrition: Goal: Adequate nutrition will be maintained Outcome: Progressing   Problem: Coping: Goal: Level of anxiety will decrease Outcome: Progressing   Problem: Pain Managment: Goal: General experience of comfort will improve Outcome: Progressing   

## 2023-06-04 NOTE — Progress Notes (Signed)
Physical Therapy Treatment Patient Details Name: [name] MRN: [id number] DOB: 1956-08-11 Today's Date: 06/04/2023   History of Present Illness Pt is a 67 y.o. female with PMH consisting of HTN, CAD, CABG, CVA (L sided symptoms), type 2 DM. Pt admitted to ED (6/17) with left hip pain following a mechanical fall at work. MD assessment includes: closed left femoral neck fx. Pt is now s/p left hip hemiarthroplasty (6/19).    PT Comments    Pt lying in bed upon arrival. She continues to require min A for supine > sit mainly for trunk control and LLE management. Pt required max A + 2 for sit > supine at end secondary to pain limitations and significantly slowed mobility. Pt needed extensive seated rest breaks throughout the session for pain control. Pt able to stand from elevated bed height with mod A for elevation and VC's for posterior hip precaution compliance. Upon standing pt demonstrated posterior + R lateral lean secondary to not wanting to put weight through LLE. Pt unable to take any steps at EOB even with heavy VC's. SpO2 88-91% throughout most of session, dropped to 83-84% at the very end, nursing notified. Pt will benefit from continued PT services upon discharge to safely address deficits listed in patient problem list for decreased caregiver assistance and eventual return to PLOF.   Recommendations for follow up therapy are one component of a multi-disciplinary discharge planning process, led by the attending physician.  Recommendations may be updated based on patient status, additional functional criteria and insurance authorization.  Follow Up Recommendations  Can patient physically be transported by private vehicle: No    Assistance Recommended at Discharge    Patient can return home with the following A little help with walking and/or transfers;Help with stairs or ramp for entrance;Assist for transportation;A little help with bathing/dressing/bathroom;Assistance with  cooking/housework   Equipment Recommendations  BSC/3in1    Recommendations for Other Services       Precautions / Restrictions Precautions Precautions: Posterior Hip;Fall Restrictions Weight Bearing Restrictions: Yes LLE Weight Bearing: Weight bearing as tolerated     Mobility  Bed Mobility Overal bed mobility: Needs Assistance Bed Mobility: Supine to Sit, Sit to Supine     Supine to sit: Min assist, HOB elevated Sit to supine: Max assist, +2 for physical assistance   General bed mobility comments: Supine < sit min A for trunk control and LLE placement. Increased time/effort to complete, pt requested multiple breaks secondary to pain. Max A + 2 sit > supine at end of session for trunk/LE placement.    Transfers Overall transfer level: Needs assistance Equipment used: Rolling walker (2 wheels) Transfers: Sit to/from Stand Sit to Stand: Mod assist, From elevated surface           General transfer comment: Mod A + 2 STS for elevation. VC's for proper LE/UE placement for hip precaution compliance. Continued high anxiety with movement. Posterior/R lateral lean upon standing requiring verbal cues to correct secondary to pt not wanting to put weight through LLE.    Ambulation/Gait               General Gait Details: Not performed, pt unable to take step even with heavy VC's.   Stairs             Wheelchair Mobility    Modified Rankin (Stroke Patients Only)       Balance Overall balance assessment: Needs assistance Sitting-balance support: Single extremity supported, Feet supported Sitting balance-Leahy Scale: Fair  Standing balance support: Bilateral upper extremity supported, During functional activity, Reliant on assistive device for balance Standing balance-Leahy Scale: Poor Standing balance comment: Verbal cues needed for pt to stand fully upright. Posterior lean upon standing.                            Cognition  Arousal/Alertness: Awake/alert Behavior During Therapy: WFL for tasks assessed/performed Overall Cognitive Status: Within Functional Limits for tasks assessed                                          Exercises Other Exercises Other Exercises: Posterior hip precaution education    General Comments General comments (skin integrity, edema, etc.): Pt able to remember 2/3 posterior hip precautions.      Pertinent Vitals/Pain Pain Assessment Faces Pain Scale: Hurts whole lot Pain Location: L hip Pain Descriptors / Indicators: Aching, Grimacing, Guarding Pain Intervention(s): Monitored during session, Limited activity within patient's tolerance, Premedicated before session    Home Living                          Prior Function            PT Goals (current goals can now be found in the care plan section) Progress towards PT goals: Progressing toward goals    Frequency    BID      PT Plan Current plan remains appropriate    Co-evaluation              AM-PAC PT "6 Clicks" Mobility   Outcome Measure  Help needed turning from your back to your side while in a flat bed without using bedrails?: A Little Help needed moving from lying on your back to sitting on the side of a flat bed without using bedrails?: A Little Help needed moving to and from a bed to a chair (including a wheelchair)?: A Lot Help needed standing up from a chair using your arms (e.g., wheelchair or bedside chair)?: A Lot Help needed to walk in hospital room?: A Lot Help needed climbing 3-5 steps with a railing? : Total 6 Click Score: 13    End of Session Equipment Utilized During Treatment: Gait belt Activity Tolerance: Patient limited by pain Patient left: in bed;with bed alarm set;with call bell/phone within reach Nurse Communication: Mobility status;Precautions;Weight bearing status;Other (comment) (Pt's O2 was low at start/end of session (mid to high 80's), nursing  notified.) PT Visit Diagnosis: Other abnormalities of gait and mobility (R26.89);Muscle weakness (generalized) (M62.81);Pain;Unsteadiness on feet (R26.81) Pain - Right/Left: Left Pain - part of body: Hip     Time: 9629-5284 PT Time Calculation (min) (ACUTE ONLY): 32 min  Charges:                       Vicente Weidler, SPT 06/04/23, 2:01 PM

## 2023-06-04 NOTE — Progress Notes (Signed)
  Progress Note   Patient: [name] UJW:[id number] DOB: 02-17-1956 DOA: 05/31/2023     4 DOS: the patient was seen and examined on 06/04/2023   Brief hospital course: 67 y.o. female with medical history significant for disease status post four-vessel CABG, type 2 diabetes mellitus, hypertension, CVA, dyslipidemia, GERD, depression and anxiety admitted for left femoral neck fracture status post fall  6/18: Ortho consult, plan for surgery tomorrow  Assessment and Plan: Closed left hip fracture (HCC) - Left hip hemiarthroplasty 6/19, successful - hgb 10.8 today - Pain management   bowel regimen, will increase miralax - foley has been discontinued - pt/ot advising snf, toc working on that - lovenox for dvt ppx  Acute toxic metabolic encephalopathy This morning confused, per rn dyspneic. Confusion improving. Probably 2/2 standing tramodol - f/u cxr - monitor  Uncontrolled type 2 diabetes mellitus with hyperglycemia, without long-term current use of insulin (HCC) Hemoglobin A1c 11 suggestive of poor blood sugar control - Diabetic nurse consulted -  continue basal/bolus regimen,  - hold off metformin and glyburide - Continue Jardiance.  Hypertension controlled - Continue amlodipine and Cozaar, as needed IV labetalol for better blood pressure control.  Dyslipidemia - continue statin therapy   Hx CVA - cont aspirin, rosuvastatin  Anxiety and depression - continue Xanax and Zoloft.  As needed trazodone for sleep        Subjective: hip pain controlled, confused this morning now much improved, no bm  Physical Exam: Vitals:   06/03/23 1214 06/03/23 1602 06/04/23 0050 06/04/23 0802  BP: (!) 141/62 130/73 113/84 135/64  Pulse: 84 79 73 81  Resp: 18 13 17 16   Temp: 97.8 F (36.6 C) 98.1 F (36.7 C) 98.3 F (36.8 C) 99.1 F (37.3 C)  TempSrc: Oral Oral    SpO2: 91% 93% 90% 95%  Weight:      Height:       67 year old female lying in the bed comfortably without  any acute distress Lungs clear to auscultation bilaterally Heart regular rate and rhythm Abdomen soft, benign Neuro alert and awake, nonfocal Extremities: Left lower extremity distal sensation intact, bandage over lateral side c/d/i Data Reviewed:  There are no new results to review at this time.  Family Communication: None at bedside  Disposition: Status is: Inpatient Remains inpatient appropriate because: Management of left hip/femoral neck fracture  Planned Discharge Destination: Skilled nursing facility,     DVT prophylaxis-Lovenox (1 dose given today.  Hold after that for planned surgery tomorrow) Time spent: 25 minutes  Author: Silvano Bilis, MD 06/04/2023 1:08 PM  For on call review [email or web address].

## 2023-06-04 NOTE — Progress Notes (Signed)
Physical Therapy Treatment Patient Details Name: [name] MRN: [id number] DOB: November 06, 1956 Today's Date: 06/04/2023   History of Present Illness Pt is a 67 y.o. female with PMH consisting of HTN, CAD, CABG, CVA (L sided symptoms), type 2 DM. Pt admitted to ED (6/17) with left hip pain following a mechanical fall at work. MD assessment includes: closed left femoral neck fx. Pt is now s/p left hip hemiarthroplasty (6/19).    PT Comments    Pt was long sitting in bed upon arrival on 2 L o2. Sao2 91%. She was removed from o2 taht was placed earlier this date, however did desaturates to low 80s. Author reapplied 2 L o2 and RN made aware. Pt has increased lethargy this afternoon. Pain meds were held due to lethargy/change in status. Pt struggles throughout this limited session to stay awake. She performed several exercises that are listed below but session cut short due to falling asleep.  Acute PT will return tomorrow and continue to follow per current POC.   Recommendations for follow up therapy are one component of a multi-disciplinary discharge planning process, led by the attending physician.  Recommendations may be updated based on patient status, additional functional criteria and insurance authorization.  Follow Up Recommendations  Can patient physically be transported by private vehicle: No    Assistance Recommended at Discharge Intermittent Supervision/Assistance  Patient can return home with the following A little help with walking and/or transfers;Help with stairs or ramp for entrance;Assist for transportation;A little help with bathing/dressing/bathroom;Assistance with cooking/housework   Equipment Recommendations  BSC/3in1       Precautions / Restrictions Precautions Precautions: Posterior Hip;Fall Precaution Booklet Issued: Yes (comment) Restrictions Weight Bearing Restrictions: Yes LLE Weight Bearing: Weight bearing as tolerated     Mobility  Bed Mobility     General bed mobility comments: Pt too lethargic to perrform OOB activity. She did participate in minimal activity (HEP handout exercises) but unable to perform all due to falling asleep several times during performance    Transfers  General transfer comment: Not formally tested this         Cognition Arousal/Alertness: Lethargic Behavior During Therapy:  (sleepy/lethargic) Overall Cognitive Status: Difficult to assess (level of alertness)      General Comments: Pt extremely lethargic this afternoon. RN held pains meds due to lethargy. pt was able to follow commands but required to be constantly aroused/ engaged to maintain being awake. MD (hospitalist) arrived during session.        Exercises Total Joint Exercises Ankle Circles/Pumps: AROM, Both, 10 reps, Supine Quad Sets: Strengthening, Left, 10 reps, Supine Gluteal Sets: Strengthening, Both, 10 reps, Supine Hip ABduction/ADduction: AAROM, 10 reps Other Exercises Other Exercises: Posterior hip precaution education. She was able to recall 2/3 hip precautions    General Comments General comments (skin integrity, edema, etc.): Pt able to remember 2/3 posterior hip precautions.      Pertinent Vitals/Pain Pain Assessment Pain Assessment: No/denies pain Pain Score: 0-No pain Pain Intervention(s): Monitored during session     PT Goals (current goals can now be found in the care plan section) Acute Rehab PT Goals Patient Stated Goal: get better so I can go home Progress towards PT goals: Not progressing toward goals - comment (pt unable to stay awake this session)    Frequency    BID      PT Plan Current plan remains appropriate       AM-PAC PT "6 Clicks" Mobility   Outcome Measure  Help needed  turning from your back to your side while in a flat bed without using bedrails?: A Little Help needed moving from lying on your back to sitting on the side of a flat bed without using bedrails?: A Little Help needed moving  to and from a bed to a chair (including a wheelchair)?: A Lot Help needed standing up from a chair using your arms (e.g., wheelchair or bedside chair)?: A Lot Help needed to walk in hospital room?: A Lot Help needed climbing 3-5 steps with a railing? : Total 6 Click Score: 13    End of Session Equipment Utilized During Treatment: Oxygen Activity Tolerance: Patient limited by lethargy Patient left: in bed;with bed alarm set;with call bell/phone within reach Nurse Communication: Mobility status;Precautions;Weight bearing status;Other (comment) PT Visit Diagnosis: Other abnormalities of gait and mobility (R26.89);Muscle weakness (generalized) (M62.81);Pain;Unsteadiness on feet (R26.81) Pain - Right/Left: Left Pain - part of body: Hip     Time: 1250-1306 PT Time Calculation (min) (ACUTE ONLY): 16 min  Charges:  $Therapeutic Exercise: 8-22 mins $Therapeutic Activity: 23-37 mins                     Jetta Lout PTA 06/04/23, 3:18 PM

## 2023-06-05 DIAGNOSIS — M810 Age-related osteoporosis without current pathological fracture: Secondary | ICD-10-CM | POA: Diagnosis present

## 2023-06-05 LAB — BASIC METABOLIC PANEL
Anion gap: 12 (ref 5–15)
BUN: 22 mg/dL (ref 8–23)
CO2: 24 mmol/L (ref 22–32)
Calcium: 8.6 mg/dL — ABNORMAL LOW (ref 8.9–10.3)
Chloride: 93 mmol/L — ABNORMAL LOW (ref 98–111)
Creatinine, Ser: 0.74 mg/dL (ref 0.44–1.00)
GFR, Estimated: >60 mL/min (ref >60)
Glucose, Bld: 151 mg/dL — ABNORMAL HIGH (ref 70–99)
Potassium: 4.4 mmol/L (ref 3.5–5.1)
Sodium: 129 mmol/L — ABNORMAL LOW (ref 135–145)

## 2023-06-05 LAB — CBC
HCT: 37.3 % (ref 36.0–46.0)
Hemoglobin: 12 g/dL (ref 12.0–15.0)
MCH: 28 pg (ref 26.0–34.0)
MCHC: 32.2 g/dL (ref 30.0–36.0)
MCV: 86.9 fL (ref 80.0–100.0)
Platelets: 249 10*3/uL (ref 150–400)
RBC: 4.29 MIL/uL (ref 3.87–5.11)
RDW: 12.3 % (ref 11.5–15.5)
WBC: 10.4 10*3/uL (ref 4.0–10.5)
nRBC: 0 % (ref 0.0–0.2)

## 2023-06-05 LAB — GLUCOSE, CAPILLARY
Glucose-Capillary: 137 mg/dL — ABNORMAL HIGH (ref 70–99)
Glucose-Capillary: 211 mg/dL — ABNORMAL HIGH (ref 70–99)
Glucose-Capillary: 166 mg/dL — ABNORMAL HIGH (ref 70–99)
Glucose-Capillary: 155 mg/dL — ABNORMAL HIGH (ref 70–99)

## 2023-06-05 MED ORDER — LACTULOSE 10 GM/15ML PO SOLN
20.0000 g | Freq: Every day | ORAL | Status: DC
  Administered 2023-06-05 – 2023-06-11 (×7): 20 g via ORAL
  Filled 2023-06-05 (×7): qty 30

## 2023-06-05 NOTE — Progress Notes (Signed)
Physical Therapy Treatment (Co-treat with OT) Patient Details Name: [name] MRN: [id number] DOB: 06/14/1956 Today's Date: 06/05/2023   History of Present Illness Pt is a 67 y.o. female with PMH consisting of HTN, CAD, CABG, CVA (L sided symptoms), type 2 DM. Pt admitted to ED (6/17) with left hip pain following a mechanical fall at work. MD assessment includes: closed left femoral neck fx. Pt is now s/p left hip hemiarthroplasty (6/19).    PT Comments    Patient resting in bed at start of treatment session and lethargic. PT instructed patient in ROM exercise. Patient required physical assistance for [facility] and extra time due to weakness and pain. She had a difficult time initiating exercise in LLE. Patient reports increased pain in LLE especially with movement. Spo2 levels when supine were 90-91% while on room air. Spoke with RN; RN reported that pain meds have been held as patient has been lethargic. Patient required mod A +2 for transition supine to sitting edge of bed. Upon sitting edge of bed, Spo2 levels improved to 95-96% while on room air. Instructed patient in sit<>stand transfer, she required mod A +2 for equipment/patient management. PT educated patient to advance LLE and keep RLE back for increased weight bearing in RLE. Patient is also limited with LUE weakness from previous stroke. Upon standing she reports severe pain in LLE requiring short rest break. PT educated patient how her Spo2 levels were better sitting up and that she was more alert and needed to move around for healing. Patient agreed to transfer to bedside chair. She required mod A +2 for sit to stand and then stand pivot transfer to bedside chair with cues for positioning and technique. RN came in room towards end of session, informed RN how Spo2 levels were better and patient more alert when sitting up. PT/OT suggested ice for pain control if pain meds are not appropriate. RN agreed. PT recommended patient sit up for at  least 1.5 hours for improved circulation and mobility. Patient agreeable. Patient would benefit from additional skilled PT intervention to improve strength, balance and gait safety;   Recommendations for follow up therapy are one component of a multi-disciplinary discharge planning process, led by the attending physician.  Recommendations may be updated based on patient status, additional functional criteria and insurance authorization.  Follow Up Recommendations  Can patient physically be transported by private vehicle: No    Assistance Recommended at Discharge Intermittent Supervision/Assistance  Patient can return home with the following A little help with walking and/or transfers;Help with stairs or ramp for entrance;Assist for transportation;A little help with bathing/dressing/bathroom;Assistance with cooking/housework   Equipment Recommendations  BSC/3in1    Recommendations for Other Services       Precautions / Restrictions Precautions Precautions: Posterior Hip;Fall Precaution Booklet Issued: Yes (comment) Restrictions Weight Bearing Restrictions: Yes LLE Weight Bearing: Weight bearing as tolerated     Mobility  Bed Mobility Overal bed mobility: Needs Assistance Bed Mobility: Supine to Sit     Supine to sit: Mod assist, +2 for physical assistance     General bed mobility comments: Patient initially very lethargic as well as decreased following directions and slow to response needs assistance with left lower extremity that she also had a stroke in the past.    Transfers Overall transfer level: Needs assistance Equipment used: Rolling walker (2 wheels) Transfers: Bed to chair/wheelchair/BSC Sit to Stand: Mod assist, +2 physical assistance Stand pivot transfers: Mod assist, +2 physical assistance  General transfer comment: Pivot transfer using rolling walker was able to take 1 step weight shifting with max verbal cueing using a walker-pain increases 7/10 in  left hip    Ambulation/Gait               General Gait Details: Not performed, pt unable to take step even with heavy VC's.   Stairs             Wheelchair Mobility    Modified Rankin (Stroke Patients Only)       Balance Overall balance assessment: Needs assistance   Sitting balance-Leahy Scale: Fair     Standing balance support: Bilateral upper extremity supported, During functional activity, Reliant on assistive device for balance Standing balance-Leahy Scale: Poor Standing balance comment: Verbal cues needed for pt to stand fully upright. Posterior lean upon standing.                            Cognition Arousal/Alertness: Lethargic Behavior During Therapy: Flat affect Overall Cognitive Status: Difficult to assess                                 General Comments: Lethargic -slow to response to patient.  Upon sitting up and drinking some water appear more awake.  Nursing held pain medicine today because of lethargic.  O2 sats also better sitting up.        Exercises Total Joint Exercises Short Arc Quad: AROM, Left, 5 reps Hip ABduction/ADduction: AAROM, Left, 5 reps Other Exercises Other Exercises: Pt re-educated in posterior hip precautions. she was able to recall 2/3 precautions but required prompting to avoid excessive hip flexion. throughout session she required verbal cues for proper positioning including to avoid hip IR during stand pivot transfer. Other Exercises: Instructed patient in supine LE exercise. patient very lethargic and hesitant to move LLE requiring AAROM and extra time to initiate movement in lower extremity. Patient complains of increased LLE pain. spoke with RN, they have been holding pain meds as patient has been more lethargic. While supine SPO2 assessed, averaged 90%, upon sitting up, Spo2 levels remained 95-96% while on room air. Educated patient on benefits of sitting up and moving.    General Comments  General comments (skin integrity, edema, etc.): Pt able to remember 2/3 posterior hip precautions (avoid IR/crossing leg), she requires prompts to avoid flexing at hip;      Pertinent Vitals/Pain Pain Assessment Pain Assessment: 0-10 Pain Score: 5  Pain Location: left hip Pain Descriptors / Indicators: Aching, Sore, Grimacing Pain Intervention(s): Limited activity within patient's tolerance, Monitored during session, Repositioned, Patient requesting pain meds-RN notified    Home Living Family/patient expects to be discharged to:: Private residence Living Arrangements: Alone Available Help at Discharge: Family;Available PRN/intermittently Type of Home: House Home Access: Stairs to enter         Home Equipment: Agricultural consultant (2 wheels);Cane - quad;Shower seat      Prior Function            PT Goals (current goals can now be found in the care plan section) Acute Rehab PT Goals Patient Stated Goal: Walk better to go back to work PT Goal Formulation: With patient Time For Goal Achievement: 06/16/23 Potential to Achieve Goals: Fair Progress towards PT goals: Progressing toward goals    Frequency    BID      PT Plan Current plan remains  appropriate    Co-evaluation              AM-PAC PT "6 Clicks" Mobility   Outcome Measure  Help needed turning from your back to your side while in a flat bed without using bedrails?: A Lot Help needed moving from lying on your back to sitting on the side of a flat bed without using bedrails?: A Lot Help needed moving to and from a bed to a chair (including a wheelchair)?: A Lot Help needed standing up from a chair using your arms (e.g., wheelchair or bedside chair)?: A Lot Help needed to walk in hospital room?: Total Help needed climbing 3-5 steps with a railing? : Total 6 Click Score: 10    End of Session Equipment Utilized During Treatment: Gait belt Activity Tolerance: Patient limited by lethargy;Patient limited by  pain Patient left: in chair;with call bell/phone within reach;with chair alarm set Nurse Communication: Mobility status PT Visit Diagnosis: Other abnormalities of gait and mobility (R26.89);Muscle weakness (generalized) (M62.81);Pain;Unsteadiness on feet (R26.81) Pain - Right/Left: Left Pain - part of body: Hip     Time: 1610-9604 PT Time Calculation (min) (ACUTE ONLY): 39 min  Charges:  $Therapeutic Exercise: 8-22 mins $Therapeutic Activity: 8-22 mins                       Kynsleigh Westendorf PT, DPT 06/05/2023, 3:51 PM

## 2023-06-05 NOTE — Progress Notes (Signed)
Occupational Therapy Treatment Patient Details Name: [name] MRN: [id number] DOB: 1956/03/16 Today's Date: 06/05/2023   History of present illness Pt is a 67 y.o. female with PMH consisting of HTN, CAD, CABG, CVA (L sided symptoms), type 2 DM. Pt admitted to ED (6/17) with left hip pain following a mechanical fall at work. MD assessment includes: closed left femoral neck fx. Pt is now s/p left hip hemiarthroplasty (6/19).   OT comments  Pt in bed upon entering room.  Patient very lethargic and slow to respond to questions.  Able to verbalize 2 out of 3.  Hip precautions .  Patient verbalized 5/10 pain in the left hip.  Per RN holding pain medicine this date because of increased lethargic.  O2 sats low 90s.  Education provided on posterior hip precautions.  Supine to sit patient mod assist x 2.  Pain increased to a 7/10 in left hip.  Patient more alert as well as O2 stats raising to high 90s.  Patient was educated on adaptive equipment that she did verbalize using in the past with her other hip fracture.  Was able to use a reacher better than sock aid.  She had in the past stroke that affected the left upper extremity making it difficult for her to pull and don sock on sock aid.  Patient mod assist x 2 for sit and stand and using walker for appropriate transfer to chair.  Patient was able with max verbal cueing to take 1 step pushing through upper extremity.  Patient left in chair more alert and nursing reported we will provide her with some pain medicine possibly.  Because of being more alert.  Pt is anticipated to require setup for UB dressing, minA for bathing and mod-maxA for LB ADLs. Patient will benefit from acute OT to increase overall independence in the areas of ADLs, functional mobility, decrease caregiver burden and minimize the risk of falls in order to safely discharge.     Recommendations for follow up therapy are one component of a multi-disciplinary discharge planning process, led  by the attending physician.  Recommendations may be updated based on patient status, additional functional criteria and insurance authorization.    Assistance Recommended at Discharge  SNF  Patient can return home with the following      Equipment Recommendations       Recommendations for Other Services      Precautions / Restrictions Precautions Precautions: Posterior Hip;Fall Precaution Booklet Issued: Yes (comment) Restrictions Weight Bearing Restrictions: Yes LLE Weight Bearing: Weight bearing as tolerated       Mobility Bed Mobility Overal bed mobility: Needs Assistance Bed Mobility: Supine to Sit       Sit to supine: Mod assist, +2 for physical assistance   General bed mobility comments: Patient initially very lethargic as well as decreased following directions and slow to response needs assistance with left lower extremity that she also had a stroke in the past.    Transfers Overall transfer level: Needs assistance Equipment used: Rolling walker (2 wheels) Transfers: Bed to chair/wheelchair/BSC Sit to Stand: Mod assist, +2 physical assistance           General transfer comment: Pivot transfer using rolling walker was able to take 1 step weight shifting with max verbal cueing using a walker-pain increases 7/10 in left hip     Balance Overall balance assessment: Needs assistance   Sitting balance-Leahy Scale: Fair     Standing balance support: Bilateral upper extremity supported, During functional  activity, Reliant on assistive device for balance Standing balance-Leahy Scale: Poor Standing balance comment: Verbal cues needed for pt to stand fully upright. Posterior lean upon standing.                           ADL either performed or assessed with clinical judgement   ADL                         Lower Body Dressing Details (indicate cue type and reason): Patient report with previous hip surgery she did use adaptive equipment but do  not have it anymore.  Patient needed min assist using reacher to doff socks-and mod to max assist using sock aid with donning sock over  sock aid as well as gripping with the left hand pulling  and gripping               General ADL Comments: Pain in the left hip increases/10.  Limiting her grass and participate in ADL's as well as functional mobility    Extremity/Trunk Assessment Upper Extremity Assessment Upper Extremity Assessment:  (Decrease gripping with left upper extremity using adaptive equipment.  Patient report had a stroke in the past.)            Vision Baseline Vision/History: 1 Wears glasses Patient Visual Report: No change from baseline     Perception     Praxis      Cognition Arousal/Alertness: Lethargic Behavior During Therapy: Flat affect Overall Cognitive Status: Difficult to assess                                 General Comments: Lethargic -slow to response to patient.  Upon sitting up and drinking some water appear more awake.  Nursing held pain medicine today because of lethargic.  O2 sats also better sitting up.        Exercises      Shoulder Instructions       General Comments      Pertinent Vitals/ Pain       Pain Assessment Pain Score: 5  Pain Location: L hip Pain Descriptors / Indicators: Aching, Grimacing, Guarding Pain Intervention(s): Limited activity within patient's tolerance, Patient requesting pain meds-RN notified  Home Living Family/patient expects to be discharged to:: Private residence Living Arrangements: Alone Available Help at Discharge: Family;Available PRN/intermittently Type of Home: House Home Access: Stairs to enter           Bathroom Shower/Tub: Chief Strategy Officer: Standard Bathroom Accessibility: Yes   Home Equipment: Agricultural consultant (2 wheels);Cane - quad;Shower seat          Prior Functioning/Environment              Frequency  Min 1X/week         Progress Toward Goals  OT Goals(current goals can now be found in the care plan section)     Acute Rehab OT Goals Patient Stated Goal: To get stronger again OT Goal Formulation: With patient Time For Goal Achievement: 06/17/23 Potential to Achieve Goals: Good  Plan    PROGRESSING TOWARDS GOALS_   Co-evaluation                 AM-PAC OT "6 Clicks" Daily Activity     Outcome Measure  End of Session Equipment Utilized During Treatment: Gait belt  OT Visit Diagnosis: Muscle weakness (generalized) (M62.81);Other abnormalities of gait and mobility (R26.89);Unsteadiness on feet (R26.81)   Activity Tolerance Patient limited by pain   Patient Left with call bell/phone within reach;with nursing/sitter in room   Nurse Communication          Time: 1610-9604 OT Time Calculation (min): 39 min  Charges: OT General Charges $OT Visit: 1 Visit OT Treatments $Self Care/Home Management : 8-22 mins $Therapeutic Activity: 8-22 mins   Genever Hentges OTR/L,CLT 06/05/2023, 3:29 PM

## 2023-06-05 NOTE — Progress Notes (Signed)
Subjective: 3 Days Post-Op Procedure(s) (LRB): ARTHROPLASTY BIPOLAR HIP (HEMIARTHROPLASTY) (Left)   Patient is awake and alert and in mild mild to moderate pain.  She been out of bed in the chair.  Hemoglobin was 12.0 yesterday.  Dressing is dry.  She will need skilled nursing placement. Patient reports pain as moderate.  Objective:   VITALS:   Vitals:   06/05/23 0008 06/05/23 0823  BP: (!) 153/74 133/81  Pulse: 96 99  Resp: 18 17  Temp: 99.5 F (37.5 C)   SpO2: 94% 96%    Neurologically intact Incision: scant drainage  LABS Recent Labs    06/03/23 0407 06/04/23 0433 06/05/23 0440  HGB 11.8* 10.8* 12.0  HCT 35.8* 32.9* 37.3  WBC 11.2* 9.7 10.4  PLT 227 218 249    Recent Labs    06/03/23 0407 06/04/23 0433 06/05/23 0440  NA 134* 132* 129*  K 4.6 4.2 4.4  BUN 30* 32* 22  CREATININE 0.99 0.96 0.74  GLUCOSE 322* 208* 151*    No results for input(s): "LABPT", "INR" in the last 72 hours.   Assessment/Plan: 3 Days Post-Op Procedure(s) (LRB): ARTHROPLASTY BIPOLAR HIP (HEMIARTHROPLASTY) (Left)   Advance diet Up with therapy Discharge to SNF

## 2023-06-05 NOTE — Progress Notes (Signed)
Physical Therapy Treatment Patient Details Name: [name] MRN: [id number] DOB: [birth year] Today's Date: 06/05/2023   History of Present Illness Pt is a 67 y.o. female with PMH consisting of HTN, CAD, CABG, CVA (L sided symptoms), type 2 DM. Pt admitted to ED (6/17) with left hip pain following a mechanical fall at work. MD assessment includes: closed left femoral neck fx. Pt is now s/p left hip hemiarthroplasty (6/19).    PT Comments    Patient continues to be lethargic this afternoon. However she was motivated to transfer from bedside chair to bed. Spo2 levels monitored, she continues to trend in 95-96% on room air when sitting or when transferring. Patient instructed in seated LE exercise. She is hesitant to initiate ROM in LLE requiring AAROM due to weakness and pain. She was able to scoot forward in chair easier this afternoon compared to earlier in day.  Patient required mod A +2 for sit to stand transfer and stand pivot transfer to bed. Once in bed, she required mod A +1 to transition to supine. Throughout session she requires cues for proper positioning to help alleviate stress to LLE and to maintain precautions. Patient often internally rotates LLE during pivot or when supine, requiring assistance to achieve neutral position. RN reports he did give her pain meds this afternoon. Patient verbalized understanding to increase mobility but reports she is in so much pain and is really tired. MD came to check on patient during therapy session. She was left in bed with bed alarm on. Patient would benefit from additional skilled PT intervention to improve strength, balance and gait safety;   Recommendations for follow up therapy are one component of a multi-disciplinary discharge planning process, led by the attending physician.  Recommendations may be updated based on patient status, additional functional criteria and insurance authorization.  Follow Up Recommendations  Can patient physically  be transported by private vehicle: No    Assistance Recommended at Discharge Intermittent Supervision/Assistance  Patient can return home with the following A little help with walking and/or transfers;Help with stairs or ramp for entrance;Assist for transportation;A little help with bathing/dressing/bathroom;Assistance with cooking/housework   Equipment Recommendations  BSC/3in1    Recommendations for Other Services       Precautions / Restrictions Precautions Precautions: Posterior Hip;Fall Precaution Booklet Issued: Yes (comment) Restrictions Weight Bearing Restrictions: Yes LLE Weight Bearing: Weight bearing as tolerated     Mobility  Bed Mobility Overal bed mobility: Needs Assistance Bed Mobility: Sit to Supine     Supine to sit: Mod assist, +2 for physical assistance Sit to supine: Mod assist, HOB elevated   General bed mobility comments: Patient required assistance for lifting LE into bed, extra time/effort required.    Transfers Overall transfer level: Needs assistance Equipment used: Rolling walker (2 wheels) Transfers: Bed to chair/wheelchair/BSC Sit to Stand: Mod assist, +2 physical assistance Stand pivot transfers: Mod assist, +2 physical assistance         General transfer comment: Pivot transfer using rolling walker    Ambulation/Gait               General Gait Details: Not performed, pt unable to take step even with heavy VC's.   Stairs             Wheelchair Mobility    Modified Rankin (Stroke Patients Only)       Balance Overall balance assessment: Needs assistance   Sitting balance-Leahy Scale: Fair     Standing balance support: Bilateral upper extremity  supported, During functional activity, Reliant on assistive device for balance Standing balance-Leahy Scale: Poor Standing balance comment: Verbal cues needed for pt to stand fully upright. Posterior lean upon standing.                            Cognition  Arousal/Alertness: Lethargic Behavior During Therapy: Flat affect Overall Cognitive Status: Difficult to assess                                 General Comments: Patient resting in chair, but when therapy entered she woke up in order to get into bed. More alert when sitting upright.        Exercises Total Joint Exercises Short Arc Quad: AROM, Left, 5 reps Hip ABduction/ADduction: AAROM, Left, 5 reps Long Arc Quad: AROM, Both, 5 reps Other Exercises Other Exercises: Instructed patient in seated LAQ prior to transfer for increased ROM/strengthening. Patient is still hesitant to move LLE due to pain and weakness. Other Exercises: Instructed patient in supine LE exercise. patient very lethargic and hesitant to move LLE requiring AAROM and extra time to initiate movement in lower extremity. Patient complains of increased LLE pain. spoke with RN, they have been holding pain meds as patient has been more lethargic. While supine SPO2 assessed, averaged 90%, upon sitting up, Spo2 levels remained 95-96% while on room air. Educated patient on benefits of sitting up and moving.    General Comments General comments (skin integrity, edema, etc.): Pt able to remember 2/3 posterior hip precautions (avoid IR/crossing leg), she requires prompts to avoid flexing at hip;      Pertinent Vitals/Pain Pain Assessment Pain Assessment: 0-10 Pain Score: 7  Pain Location: left hip Pain Descriptors / Indicators: Aching, Sore, Grimacing Pain Intervention(s): Limited activity within patient's tolerance, Monitored during session, Repositioned    Home Living Family/patient expects to be discharged to:: Private residence Living Arrangements: Alone Available Help at Discharge: Family;Available PRN/intermittently Type of Home: House Home Access: Stairs to enter         Home Equipment: Agricultural consultant (2 wheels);Cane - quad;Shower seat      Prior Function            PT Goals (current goals  can now be found in the care plan section) Acute Rehab PT Goals Patient Stated Goal: Walk better to go back to work PT Goal Formulation: With patient Time For Goal Achievement: 06/16/23 Potential to Achieve Goals: Fair Progress towards PT goals: Progressing toward goals    Frequency    BID      PT Plan Current plan remains appropriate    Co-evaluation              AM-PAC PT "6 Clicks" Mobility   Outcome Measure  Help needed turning from your back to your side while in a flat bed without using bedrails?: A Lot Help needed moving from lying on your back to sitting on the side of a flat bed without using bedrails?: A Lot Help needed moving to and from a bed to a chair (including a wheelchair)?: A Lot Help needed standing up from a chair using your arms (e.g., wheelchair or bedside chair)?: A Lot Help needed to walk in hospital room?: Total Help needed climbing 3-5 steps with a railing? : Total 6 Click Score: 10    End of Session Equipment Utilized During Treatment: Gait belt Activity Tolerance:  Patient limited by lethargy;Patient limited by pain Patient left: with call bell/phone within reach;in bed;with bed alarm set Nurse Communication: Mobility status PT Visit Diagnosis: Other abnormalities of gait and mobility (R26.89);Muscle weakness (generalized) (M62.81);Pain;Unsteadiness on feet (R26.81) Pain - Right/Left: Left Pain - part of body: Hip     Time: 1330-1350 PT Time Calculation (min) (ACUTE ONLY): 20 min  Charges: $Therapeutic Activity: 8-22 mins                     Myiah Petkus PT, DPT 06/05/2023, 4:02 PM

## 2023-06-05 NOTE — Progress Notes (Signed)
  Progress Note   Patient: [name] GNF:[id number] DOB: 1956-07-04 DOA: 05/31/2023     5 DOS: the patient was seen and examined on 06/05/2023   Brief hospital course: 67 y.o. female with medical history significant for disease status post four-vessel CABG, type 2 diabetes mellitus, hypertension, CVA, dyslipidemia, GERD, depression and anxiety admitted for left femoral neck fracture status post fall  6/18: Ortho consult, plan for surgery tomorrow  Assessment and Plan: Closed left hip fracture (HCC) - Left hip hemiarthroplasty 6/19, successful - hgb 12 today - Pain management   bowel regimen, will add lactulose as still no bm after surgery - pt/ot advising snf, toc working on that - lovenox for dvt ppx - outpt pcp f/u to address osteoarthritis  Uncontrolled type 2 diabetes mellitus with hyperglycemia, without long-term current use of insulin (HCC) Hemoglobin A1c 11 suggestive of poor blood sugar control -  continue basal/bolus regimen,  - hold off metformin and glyburide - Continue Jardiance.  Hyponatremia Mild, has baseline low sodium, likely siadh 2/2 pain - monitor for now, further w/u if declines  Hypertension controlled - Continue amlodipine and Cozaar, as needed IV labetalol for better blood pressure control.  Dyslipidemia - continue statin therapy   Hx CVA - cont aspirin, rosuvastatin  Anxiety and depression - continue Xanax and Zoloft.  As needed trazodone for sleep        Subjective: hip pain controlled, tolerating diet  Physical Exam: Vitals:   06/04/23 1537 06/04/23 1612 06/05/23 0008 06/05/23 0823  BP: (!) 141/60  (!) 153/74 133/81  Pulse: 88  96 99  Resp: 16  18 17   Temp: 98.9 F (37.2 C) 98.8 F (37.1 C) 99.5 F (37.5 C)   TempSrc:      SpO2: (!) 89%  94% 96%  Weight:      Height:       67 year old female lying in the bed comfortably without any acute distress Lungs clear to auscultation bilaterally Heart regular rate and  rhythm Abdomen soft, benign Neuro alert and awake, nonfocal Extremities: Left lower extremity distal sensation intact, bandage over lateral side c/d/i Data Reviewed:  There are no new results to review at this time.  Family Communication: None at bedside  Disposition: Status is: Inpatient Remains inpatient appropriate because: Management of left hip/femoral neck fracture  Planned Discharge Destination: Skilled nursing facility,     DVT prophylaxis-Lovenox (1 dose given today.  Hold after that for planned surgery tomorrow) Time spent: 25 minutes  Author: Silvano Bilis, MD 06/05/2023 1:40 PM  For on call review [email or web address].

## 2023-06-05 NOTE — TOC Progression Note (Addendum)
Transition of Care [facility]) - Progression Note    Patient Details  Name: [name] MRN: [id number] Date of Birth: 23-Sep-1956  [facility] CM/SW Contact  Bing Quarry, RN Phone Number: 06/05/2023, 1:02 PM  Clinical Narrative:  6/22: Attempted to reach patient to discuss bed choices and left VM to return call to RN CM today. Called room phone as well. RN reported patient sleeping. Will try again later. EDD 2 days. Gabriel Cirri RN CM    Update: Spoke with patient and patient does not remember or says she was not given a list of Medicare.gov STR/SNF choices and did not know anything about them. She was very lethargic yesterday. Medicare.gov list provided to patient to assist in making informed choice for STR/SNF per Medicare requirements. Encouraged to choose at least 3-4 preferences and RN CM will follow up with patient later today or in the am. Gabriel Cirri RN CM   Expected Discharge Plan: Skilled Nursing Facility Barriers to Discharge: SNF Pending bed offer, Insurance Authorization  Expected Discharge Plan and Services   Discharge Planning Services: CM Consult   Living arrangements for the past 2 months: Single Family Home                                       Social Determinants of Health (SDOH) Interventions SDOH Screenings   Food Insecurity: No Food Insecurity (06/01/2023)  Housing: Low Risk  (06/01/2023)  Transportation Needs: No Transportation Needs (06/01/2023)  Utilities: Not At Risk (06/01/2023)  Alcohol Screen: Low Risk  (04/28/2023)  Depression (PHQ2-9): Low Risk  (04/28/2023)  Recent Concern: Depression (PHQ2-9) - High Risk (02/10/2023)  Financial Resource Strain: Low Risk  (04/28/2023)  Physical Activity: Inactive (04/28/2023)  Social Connections: Socially Isolated (04/28/2023)  Stress: Stress Concern Present (04/28/2023)  Tobacco Use: Medium Risk (06/03/2023)    Readmission Risk Interventions    08/11/2021    1:30 PM  Readmission Risk Prevention  Plan  Transportation Screening Complete  HRI or Home Care Consult Complete  Social Work Consult for Recovery Care Planning/Counseling Complete  Palliative Care Screening Not Applicable  Medication Review Oceanographer) Complete

## 2023-06-05 NOTE — Plan of Care (Signed)
  Problem: Education: Goal: Knowledge of General Education information will improve Description: Including pain rating scale, medication(s)/side effects and non-pharmacologic comfort measures Outcome: Progressing   Problem: Health Behavior/Discharge Planning: Goal: Ability to manage health-related needs will improve Outcome: Progressing   Problem: Clinical Measurements: Goal: Diagnostic test results will improve Outcome: Progressing   Problem: Activity: Goal: Risk for activity intolerance will decrease Outcome: Progressing   Problem: Nutrition: Goal: Adequate nutrition will be maintained Outcome: Progressing   Problem: Coping: Goal: Level of anxiety will decrease Outcome: Progressing   Problem: Pain Managment: Goal: General experience of comfort will improve Outcome: Progressing   

## 2023-06-06 LAB — BASIC METABOLIC PANEL
Anion gap: 10 (ref 5–15)
BUN: 29 mg/dL — ABNORMAL HIGH (ref 8–23)
Calcium: 8.2 mg/dL — ABNORMAL LOW (ref 8.9–10.3)
Creatinine, Ser: 0.85 mg/dL (ref 0.44–1.00)
GFR, Estimated: >60 mL/min (ref >60)

## 2023-06-06 LAB — GLUCOSE, CAPILLARY
Glucose-Capillary: 162 mg/dL — ABNORMAL HIGH (ref 70–99)
Glucose-Capillary: 186 mg/dL — ABNORMAL HIGH (ref 70–99)
Glucose-Capillary: 179 mg/dL — ABNORMAL HIGH (ref 70–99)

## 2023-06-06 LAB — TSH: TSH: 1.995 u[IU]/mL (ref 0.350–4.500)

## 2023-06-06 LAB — OSMOLALITY, URINE: Osmolality, Ur: 531 mOsm/kg (ref 300–900)

## 2023-06-06 LAB — OSMOLALITY: Osmolality: 286 mOsm/kg (ref 275–295)

## 2023-06-06 LAB — SODIUM, URINE, RANDOM: Sodium, Ur: 20 mmol/L

## 2023-06-06 MED ORDER — INSULIN GLARGINE-YFGN 100 UNIT/ML ~~LOC~~ SOLN
18.0000 [IU] | Freq: Every day | SUBCUTANEOUS | Status: DC
  Administered 2023-06-07: 18 [IU] via SUBCUTANEOUS
  Filled 2023-06-06: qty 0.18

## 2023-06-06 NOTE — Progress Notes (Signed)
Subjective: 4 Days Post-Op Procedure(s) (LRB): ARTHROPLASTY BIPOLAR HIP (HEMIARTHROPLASTY) (Left) Patient is doing well today.  She is having less pain in a couple days ago.  Hemoglobin stable.  She walked a little more today.  Dressing is dry.  Plans on going to rehab.  Patient reports pain as mild.  Objective:   VITALS:   Vitals:   06/06/23 0740 06/06/23 1520  BP: (!) 156/74 128/77  Pulse: 90 75  Resp: 16 14  Temp: 98.6 F (37 C) 97.7 F (36.5 C)  SpO2: 95% 95%    Neurologically intact Incision: dressing C/D/I  LABS Recent Labs    06/04/23 0433 06/05/23 0440  HGB 10.8* 12.0  HCT 32.9* 37.3  WBC 9.7 10.4  PLT 218 249    Recent Labs    06/04/23 0433 06/05/23 0440 06/06/23 0443  NA 132* 129* 125*  K 4.2 4.4 4.2  BUN 32* 22 29*  CREATININE 0.96 0.74 0.85  GLUCOSE 208* 151* 162*    No results for input(s): "LABPT", "INR" in the last 72 hours.   Assessment/Plan: 4 Days Post-Op Procedure(s) (LRB): ARTHROPLASTY BIPOLAR HIP (HEMIARTHROPLASTY) (Left)   Advance diet Up with therapy Discharge to SNF

## 2023-06-06 NOTE — TOC Progression Note (Signed)
Transition of Care [facility]) - Progression Note    Patient Details  Name: [name] MRN: [id number] Date of Birth: September 22, 1956  [facility] of Care [facility]) CM/SW Contact  Bing Quarry, RN Phone Number: 06/06/2023, 2:51 PM  Clinical Narrative:  6/23: Patient received Medicare.gov list on 6/22 as she could not remember while she was lethargic whether or what choice she might have made. Checked in with her today and she is waiting on daughter to arrive today to discuss with her.   Gabriel Cirri RN CM Transitions of Care Department Weekends Only [phone]      Expected Discharge Plan: Skilled Nursing Facility Barriers to Discharge: SNF Pending bed offer, Insurance Authorization  Expected Discharge Plan and Services   Discharge Planning Services: CM Consult   Living arrangements for the past 2 months: Single Family Home                                       Social Determinants of Health (SDOH) Interventions SDOH Screenings   Food Insecurity: No Food Insecurity (06/01/2023)  Housing: Low Risk  (06/01/2023)  Transportation Needs: No Transportation Needs (06/01/2023)  Utilities: Not At Risk (06/01/2023)  Alcohol Screen: Low Risk  (04/28/2023)  Depression (PHQ2-9): Low Risk  (04/28/2023)  Recent Concern: Depression (PHQ2-9) - High Risk (02/10/2023)  Financial Resource Strain: Low Risk  (04/28/2023)  Physical Activity: Inactive (04/28/2023)  Social Connections: Socially Isolated (04/28/2023)  Stress: Stress Concern Present (04/28/2023)  Tobacco Use: Medium Risk (06/03/2023)    Readmission Risk Interventions    08/11/2021    1:30 PM  Readmission Risk Prevention Plan  Transportation Screening Complete  HRI or Home Care Consult Complete  Social Work Consult for Recovery Care Planning/Counseling Complete  Palliative Care Screening Not Applicable  Medication Review Oceanographer) Complete

## 2023-06-06 NOTE — Progress Notes (Signed)
Physical Therapy Treatment Patient Details Name: [name] MRN: [id number] DOB: 04/30/1956 Today's Date: 06/06/2023   History of Present Illness Pt is a 67 y.o. female with PMH consisting of HTN, CAD, CABG, CVA (L sided symptoms), type 2 DM. Pt admitted to ED (6/17) with left hip pain following a mechanical fall at work. MD assessment includes: closed left femoral neck fx. Pt is now s/p left hip hemiarthroplasty (6/19).    PT Comments    Pre-medicated for session.  Pt requesting bedpan but does hesitantly agree to [facility] with encouragement and education.  She is able to get to EOB with min a x 2 and increased time.  Steady in sitting.  She is able to stand with min a x 2 from elevated surface and take several small shuffling steps to [facility].  She is given time but unable to have a BM at this time. Stood from lower surface with mod a x 2 and again shuffle steps slowly to recliner.  Remained up in chair after session.  She was hesitant to move due to pain yesterday and does admit it is a bit more comfortable today.   Recommendations for follow up therapy are one component of a multi-disciplinary discharge planning process, led by the attending physician.  Recommendations may be updated based on patient status, additional functional criteria and insurance authorization.  Follow Up Recommendations  Can patient physically be transported by private vehicle: No    Assistance Recommended at Discharge Intermittent Supervision/Assistance  Patient can return home with the following Help with stairs or ramp for entrance;Assist for transportation;Assistance with cooking/housework;Two people to help with walking and/or transfers;Two people to help with bathing/dressing/bathroom   Equipment Recommendations  BSC/3in1    Recommendations for Other Services       Precautions / Restrictions Precautions Precautions: Posterior Hip;Fall Restrictions Weight Bearing Restrictions: Yes LLE Weight Bearing:  Weight bearing as tolerated     Mobility  Bed Mobility Overal bed mobility: Needs Assistance Bed Mobility: Sit to Supine     Supine to sit: Min assist, +2 for physical assistance          Transfers Overall transfer level: Needs assistance Equipment used: Rolling walker (2 wheels) Transfers: Bed to chair/wheelchair/BSC Sit to Stand: Mod assist, +2 physical assistance                Ambulation/Gait Ambulation/Gait assistance: Min assist, +2 physical assistance Gait Distance (Feet): 5 Feet Assistive device: Rolling walker (2 wheels) Gait Pattern/deviations: Step-to pattern, Antalgic, Decreased stance time - left, Decreased weight shift to left, Shuffle Gait velocity: decreased     General Gait Details: struggles to step to [facility] then to recliner but is improved over yesterday.   Stairs             Wheelchair Mobility    Modified Rankin (Stroke Patients Only)       Balance Overall balance assessment: Needs assistance Sitting-balance support: Single extremity supported, Feet supported Sitting balance-Leahy Scale: Fair     Standing balance support: Bilateral upper extremity supported, During functional activity, Reliant on assistive device for balance Standing balance-Leahy Scale: Poor Standing balance comment: +2 for safety                            Cognition Arousal/Alertness: Awake/alert Behavior During Therapy: WFL for tasks assessed/performed Overall Cognitive Status: Within Functional Limits for tasks assessed  General Comments: a bit irritated at request for mobility and to use BSC over bedpan        Exercises Other Exercises Other Exercises: to BSc to void but no BM despite increased time.    General Comments        Pertinent Vitals/Pain Pain Assessment Pain Assessment: Faces Faces Pain Scale: Hurts even more Pain Location: left hip - with mobility Pain Descriptors /  Indicators: Aching, Sore, Grimacing Pain Intervention(s): Limited activity within patient's tolerance, Monitored during session, Premedicated before session, Repositioned    Home Living                          Prior Function            PT Goals (current goals can now be found in the care plan section) Progress towards PT goals: Progressing toward goals    Frequency    BID      PT Plan Current plan remains appropriate    Co-evaluation              AM-PAC PT "6 Clicks" Mobility   Outcome Measure  Help needed turning from your back to your side while in a flat bed without using bedrails?: A Lot Help needed moving from lying on your back to sitting on the side of a flat bed without using bedrails?: A Lot Help needed moving to and from a bed to a chair (including a wheelchair)?: A Lot Help needed standing up from a chair using your arms (e.g., wheelchair or bedside chair)?: A Lot Help needed to walk in hospital room?: Total Help needed climbing 3-5 steps with a railing? : Total 6 Click Score: 10    End of Session Equipment Utilized During Treatment: Gait belt Activity Tolerance: Patient limited by lethargy;Patient limited by pain Patient left: with call bell/phone within reach;in bed;with bed alarm set Nurse Communication: Mobility status PT Visit Diagnosis: Other abnormalities of gait and mobility (R26.89);Muscle weakness (generalized) (M62.81);Pain;Unsteadiness on feet (R26.81) Pain - Right/Left: Left Pain - part of body: Hip     Time: 0927-1001 PT Time Calculation (min) (ACUTE ONLY): 34 min  Charges:  $Gait Training: 8-22 mins $Therapeutic Activity: 8-22 mins                   Danielle Dess, PTA 06/06/23, 10:22 AM

## 2023-06-06 NOTE — Progress Notes (Signed)
  Progress Note   Patient: [name] XBM:[id number] DOB: 04-11-1956 DOA: 05/31/2023     6 DOS: the patient was seen and examined on 06/06/2023   Brief hospital course: 67 y.o. female with medical history significant for disease status post four-vessel CABG, type 2 diabetes mellitus, hypertension, CVA, dyslipidemia, GERD, depression and anxiety admitted for left femoral neck fracture status post fall  6/18: Ortho consult, plan for surgery tomorrow  Assessment and Plan: Closed left hip fracture (HCC) - Left hip hemiarthroplasty 6/19, successful - hgb 12 post-op - Pain management   bowel regimen, will continue lactulose as still no bm after surgery - pt/ot advising snf, toc working on that - lovenox for dvt ppx - outpt pcp f/u to address osteoarthritis  Uncontrolled type 2 diabetes mellitus with hyperglycemia, without long-term current use of insulin (HCC) Hemoglobin A1c 11 suggestive of poor blood sugar control -  continue basal/bolus regimen, will increase today semglee 15>18 - hold off metformin and glyburide - Continue Jardiance.  Hyponatremia Baseline is around 135 today has down trended to 125 likely siadh - will w/u with tsh, am cortisol, serum and urine osm, urine sodium  Hypertension Mild elevation - Continue amlodipine and Cozaar, as needed IV labetalol for better blood pressure control.  Dyslipidemia - continue statin therapy   Hx CVA - cont aspirin, rosuvastatin  Anxiety and depression - continue Xanax and Zoloft.  As needed trazodone for sleep        Subjective: hip pain improving, tolerating diet, still no stool  Physical Exam: Vitals:   06/05/23 1540 06/05/23 1601 06/06/23 0015 06/06/23 0740  BP:  136/75 (!) 149/75 (!) 156/74  Pulse: (!) 105 91 94 90  Resp:  17 18 16   Temp:  98.5 F (36.9 C) 99.3 F (37.4 C) 98.6 F (37 C)  TempSrc:      SpO2: 96% 93% 92% 95%  Weight:      Height:       67 year old female lying in the bed comfortably  without any acute distress Lungs clear to auscultation bilaterally Heart regular rate and rhythm Abdomen soft, benign Neuro alert and awake, nonfocal Extremities: Left lower extremity distal sensation intact, bandage over lateral side c/d/i Data Reviewed:  There are no new results to review at this time.  Family Communication: None at bedside  Disposition: Status is: Inpatient Remains inpatient appropriate because: Management of left hip/femoral neck fracture  Planned Discharge Destination: Skilled nursing facility,     DVT prophylaxis-Lovenox (1 dose given today.  Hold after that for planned surgery tomorrow) Time spent: 25 minutes  Author: Silvano Bilis, MD 06/06/2023 12:16 PM  For on call review [email or web address].

## 2023-06-06 NOTE — Plan of Care (Signed)
  Problem: Health Behavior/Discharge Planning: Goal: Ability to manage health-related needs will improve Outcome: Progressing   Problem: Clinical Measurements: Goal: Will remain free from infection Outcome: Progressing Goal: Diagnostic test results will improve Outcome: Progressing   Problem: Activity: Goal: Risk for activity intolerance will decrease Outcome: Progressing   Problem: Nutrition: Goal: Adequate nutrition will be maintained Outcome: Progressing   Problem: Coping: Goal: Level of anxiety will decrease Outcome: Progressing   

## 2023-06-07 LAB — LIPID PANEL
Cholesterol: 123 mg/dL (ref 0–200)
HDL: 29 mg/dL — ABNORMAL LOW (ref >40)
LDL Cholesterol: 65 mg/dL (ref 0–99)
Total CHOL/HDL Ratio: 4.2 RATIO
Triglycerides: 145 mg/dL (ref <150)
VLDL: 29 mg/dL (ref 0–40)

## 2023-06-07 LAB — BASIC METABOLIC PANEL
Anion gap: 11 (ref 5–15)
BUN: 41 mg/dL — ABNORMAL HIGH (ref 8–23)
CO2: 27 mmol/L (ref 22–32)
Calcium: 8.4 mg/dL — ABNORMAL LOW (ref 8.9–10.3)
Chloride: 93 mmol/L — ABNORMAL LOW (ref 98–111)
Creatinine, Ser: 1.09 mg/dL — ABNORMAL HIGH (ref 0.44–1.00)
GFR, Estimated: 55 mL/min — ABNORMAL LOW (ref >60)
Glucose, Bld: 175 mg/dL — ABNORMAL HIGH (ref 70–99)
Potassium: 4.2 mmol/L (ref 3.5–5.1)
Sodium: 131 mmol/L — ABNORMAL LOW (ref 135–145)

## 2023-06-07 LAB — GLUCOSE, CAPILLARY
Glucose-Capillary: 179 mg/dL — ABNORMAL HIGH (ref 70–99)
Glucose-Capillary: 214 mg/dL — ABNORMAL HIGH (ref 70–99)
Glucose-Capillary: 114 mg/dL — ABNORMAL HIGH (ref 70–99)
Glucose-Capillary: 61 mg/dL — ABNORMAL LOW (ref 70–99)

## 2023-06-07 LAB — CORTISOL-AM, BLOOD: Cortisol - AM: 18.8 ug/dL (ref 6.7–22.6)

## 2023-06-07 MED ORDER — INSULIN ASPART 100 UNIT/ML IJ SOLN
5.0000 [IU] | Freq: Three times a day (TID) | INTRAMUSCULAR | Status: DC
  Administered 2023-06-07 – 2023-06-08 (×3): 5 [IU] via SUBCUTANEOUS
  Filled 2023-06-07 (×2): qty 1

## 2023-06-07 MED ORDER — SODIUM CHLORIDE 0.9 % IV BOLUS
500.0000 mL | Freq: Once | INTRAVENOUS | Status: AC
  Administered 2023-06-07: 500 mL via INTRAVENOUS

## 2023-06-07 MED ORDER — INSULIN GLARGINE-YFGN 100 UNIT/ML ~~LOC~~ SOLN
20.0000 [IU] | Freq: Every day | SUBCUTANEOUS | Status: DC
  Administered 2023-06-08: 20 [IU] via SUBCUTANEOUS
  Filled 2023-06-07: qty 0.2

## 2023-06-07 MED ORDER — GLYCERIN (LAXATIVE) 2 G RE SUPP
1.0000 | Freq: Once | RECTAL | Status: AC
  Administered 2023-06-07: 1 via RECTAL
  Filled 2023-06-07: qty 1

## 2023-06-07 NOTE — Progress Notes (Signed)
  Progress Note   Patient: [name]:[id number] DOB: 1956-11-21 DOA: 05/31/2023     7 DOS: the patient was seen and examined on 06/07/2023   Brief hospital course: 67 y.o. female with medical history significant for disease status post four-vessel CABG, type 2 diabetes mellitus, hypertension, CVA, dyslipidemia, GERD, depression and anxiety admitted for left femoral neck fracture status post fall  6/18: Ortho consult, plan for surgery tomorrow  Assessment and Plan: Closed left hip fracture (HCC) - Left hip hemiarthroplasty 6/19, successful - hgb 12 post-op - Pain management   bowel regimen, will continue lactulose as still no bm after surgery. Add glycerine suppository today, enema tomorrow if still no bm - pt/ot advising snf, toc working on that - lovenox for dvt ppx - outpt pcp f/u to address osteoarthritis  Uncontrolled type 2 diabetes mellitus with hyperglycemia, without long-term current use of insulin (HCC) Hemoglobin A1c 11 suggestive of poor blood sugar control -  continue basal/bolus regimen will increase mealtime, will increase today semglee 18>20 - hold off metformin and glyburide - hold jardiance given dehydration  Hyponatremia Baseline 135, with low sodium and elevated urine osm and up-trending cr and bun appears dehydration playing some role. Tsh and am cortisol wnl - 500 cc ns bolus today, push fluids  Hypertension wnl - home bp meds on hold.  Dyslipidemia - continue statin therapy   Hx CVA - cont aspirin, rosuvastatin  Anxiety and depression - continue Xanax and Zoloft.  As needed trazodone for sleep        Subjective: hip pain improving, tolerating diet, still no stool  Physical Exam: Vitals:   06/06/23 0740 06/06/23 1520 06/06/23 2353 06/07/23 0700  BP: (!) 156/74 128/77 101/68 (!) 129/59  Pulse: 90 75 83 69  Resp: 16 14 16 16   Temp: 98.6 F (37 C) 97.7 F (36.5 C) 98.3 F (36.8 C) 98 F (36.7 C)  TempSrc:      SpO2: 95% 95% 93%  93%  Weight:      Height:       67 year old female lying in the bed comfortably without any acute distress Lungs clear to auscultation bilaterally Heart regular rate and rhythm Abdomen soft, benign Neuro alert and awake, nonfocal Extremities: Left lower extremity distal sensation intact, bandage over lateral side c/d/i Data Reviewed:  There are no new results to review at this time.  Family Communication: None at bedside  Disposition: Status is: Inpatient Remains inpatient appropriate because: Management of left hip/femoral neck fracture  Planned Discharge Destination: Skilled nursing facility,     DVT prophylaxis-Lovenox (1 dose given today.  Hold after that for planned surgery tomorrow) Time spent: 25 minutes  Author: Silvano Bilis, MD 06/07/2023 1:22 PM  For on call review [email or web address].

## 2023-06-07 NOTE — Plan of Care (Signed)

## 2023-06-07 NOTE — Progress Notes (Signed)
Physical Therapy Treatment Patient Details Name: [name] MRN: [id number] DOB: 1956/04/13 Today's Date: 06/07/2023   History of Present Illness Pt is a 67 y.o. female with PMH consisting of HTN, CAD, CABG, CVA (L sided symptoms), type 2 DM. Pt admitted to ED (6/17) with left hip pain following a mechanical fall at work. MD assessment includes: closed left femoral neck fx. Pt is now s/p left hip hemiarthroplasty (6/19).    PT Comments    Pt pleasant and motivated for therapy. She was able to perform 95% of supine > sit under her own power but needed min A to get to fully sitting EOB. Pt requires frequent, long therapeutic rest breaks for pain control throughout bed mobility tasks and demonstrates extremely slow movement. Session today also included transfer training to/from different surfaces. Pt able to perform STS from elevated bed height with min A  + 2 for elevation and safety, with VC's for hip precaution compliance. Pt needed similar level of assist to perform STS from [facility]. Pt able to perform 2 bouts of taking 4-5 very small, shuffle steps in order for therapist to bring [facility] or recliner closer to her. Pt's ambulation is very slow and guarded, she continues to be limited by pain. Pt will benefit from continued PT services upon discharge to safely address deficits listed in patient problem list for decreased caregiver assistance and eventual return to PLOF.   Recommendations for follow up therapy are one component of a multi-disciplinary discharge planning process, led by the attending physician.  Recommendations may be updated based on patient status, additional functional criteria and insurance authorization.  Follow Up Recommendations  Can patient physically be transported by private vehicle: No    Assistance Recommended at Discharge Intermittent Supervision/Assistance  Patient can return home with the following Help with stairs or ramp for entrance;Assist for  transportation;Assistance with cooking/housework;Two people to help with walking and/or transfers;Two people to help with bathing/dressing/bathroom   Equipment Recommendations  BSC/3in1    Recommendations for Other Services       Precautions / Restrictions Precautions Precautions: Posterior Hip;Fall Restrictions Weight Bearing Restrictions: Yes LLE Weight Bearing: Weight bearing as tolerated Other Position/Activity Restrictions: Pt anxious     Mobility  Bed Mobility Overal bed mobility: Needs Assistance Bed Mobility: Supine to Sit     Supine to sit: Min assist     General bed mobility comments: Pt mod I 95% of supine>sit, needed min A to fully scoot to EOB and for LLE management. Increased time and effort with frequent rest breaks for pain.    Transfers Overall transfer level: Needs assistance Equipment used: Rolling walker (2 wheels) Transfers: Sit to/from Stand Sit to Stand: +2 physical assistance, Min assist           General transfer comment: Min A + 2 for elevation to upright standing with RW. VC's for proper UE/LE placement for hip precaution compliance.    Ambulation/Gait Ambulation/Gait assistance: Min assist, +2 physical assistance Gait Distance (Feet): 2 x 2 feet Assistive device: Rolling walker (2 wheels) Gait Pattern/deviations: Step-to pattern, Antalgic, Decreased stance time - left, Decreased weight shift to left, Shuffle Gait velocity: decreased     General Gait Details: Min A for stability with RW. Very small, shuffling steps. Ambulation limited by pain.   Stairs             Wheelchair Mobility    Modified Rankin (Stroke Patients Only)       Balance Overall balance assessment: Needs assistance Sitting-balance  support: Single extremity supported, Feet supported Sitting balance-Leahy Scale: Fair     Standing balance support: Bilateral upper extremity supported, During functional activity, Reliant on assistive device for  balance Standing balance-Leahy Scale: Poor                              Cognition Arousal/Alertness: Awake/alert Behavior During Therapy: WFL for tasks assessed/performed Overall Cognitive Status: Within Functional Limits for tasks assessed                                          Exercises Total Joint Exercises Quad Sets: Strengthening, Left, 10 reps, Supine Gluteal Sets: Strengthening, Both, 10 reps, Supine Heel Slides: AROM, Both, 5 reps, Supine Other Exercises Other Exercises: Education on posterior hip precautions    General Comments General comments (skin integrity, edema, etc.): Pt able to remember almost all posterior hip precautions (correction for hip flexion past 90 degrees)      Pertinent Vitals/Pain Pain Assessment Faces Pain Scale: Hurts even more Pain Descriptors / Indicators: Moaning, Grimacing, Guarding Pain Intervention(s): Repositioned, RN gave pain meds during session, Monitored during session    Home Living                          Prior Function            PT Goals (current goals can now be found in the care plan section) Progress towards PT goals: Progressing toward goals    Frequency    BID      PT Plan Current plan remains appropriate    Co-evaluation              AM-PAC PT "6 Clicks" Mobility   Outcome Measure  Help needed turning from your back to your side while in a flat bed without using bedrails?: A Lot Help needed moving from lying on your back to sitting on the side of a flat bed without using bedrails?: A Lot Help needed moving to and from a bed to a chair (including a wheelchair)?: A Lot Help needed standing up from a chair using your arms (e.g., wheelchair or bedside chair)?: A Little Help needed to walk in hospital room?: A Lot Help needed climbing 3-5 steps with a railing? : Total 6 Click Score: 12    End of Session Equipment Utilized During Treatment: Gait belt Activity  Tolerance: Patient limited by pain Patient left: in chair;with SCD's reapplied;with chair alarm set;with nursing/sitter in room;with call bell/phone within reach Nurse Communication: Mobility status;Weight bearing status PT Visit Diagnosis: Other abnormalities of gait and mobility (R26.89);Muscle weakness (generalized) (M62.81);Pain;Unsteadiness on feet (R26.81) Pain - Right/Left: Left Pain - part of body: Hip     Time: 9604-5409 PT Time Calculation (min) (ACUTE ONLY): 37 min  Charges:                        Breyah Akhter, SPT 06/07/23, 1:47 PM

## 2023-06-07 NOTE — Progress Notes (Addendum)
Subjective:  POD #5 s/p left hip hemiarthroplasty.   Patient reports left hip pain as mild to moderate.  Patient has minimal pain at rest but has increased pain with movement of the left hip.  Objective:   VITALS:   Vitals:   06/06/23 0740 06/06/23 1520 06/06/23 2353 06/07/23 0700  BP: (!) 156/74 128/77 101/68 (!) 129/59  Pulse: 90 75 83 69  Resp: 16 14 16 16   Temp: 98.6 F (37 C) 97.7 F (36.5 C) 98.3 F (36.8 C) 98 F (36.7 C)  TempSrc:      SpO2: 95% 95% 93% 93%  Weight:      Height:        PHYSICAL EXAM: Left lower extremity Neurovascular intact Sensation intact distally Intact pulses distally Dorsiflexion/Plantar flexion intact Incision: dressing C/D/I No cellulitis present Compartment soft Patient has had a previous stroke which is affected her left side including some sensation distally left lower extremity.  LABS  Results for orders placed or performed during the hospital encounter of 05/31/23 (from the past 24 hour(s))  Glucose, capillary     Status: Abnormal   Collection Time: 06/06/23  4:19 PM  Result Value Ref Range   Glucose-Capillary 179 (H) 70 - 99 mg/dL  Cortisol-am, blood     Status: None   Collection Time: 06/07/23  8:17 AM  Result Value Ref Range   Cortisol - AM 18.8 6.7 - 22.6 ug/dL  Basic metabolic panel     Status: Abnormal   Collection Time: 06/07/23  8:17 AM  Result Value Ref Range   Sodium 131 (L) 135 - 145 mmol/L   Potassium 4.2 3.5 - 5.1 mmol/L   Chloride 93 (L) 98 - 111 mmol/L   CO2 27 22 - 32 mmol/L   Glucose, Bld 175 (H) 70 - 99 mg/dL   BUN 41 (H) 8 - 23 mg/dL   Creatinine, Ser 5.36 (H) 0.44 - 1.00 mg/dL   Calcium 8.4 (L) 8.9 - 10.3 mg/dL   GFR, Estimated 55 (L) >60 mL/min   Anion gap 11 5 - 15  Lipid panel     Status: Abnormal   Collection Time: 06/07/23  8:17 AM  Result Value Ref Range   Cholesterol 123 0 - 200 mg/dL   Triglycerides 644 <034 mg/dL   HDL 29 (L) >74 mg/dL   Total CHOL/HDL Ratio 4.2 RATIO   VLDL 29 0 - 40  mg/dL   LDL Cholesterol 65 0 - 99 mg/dL  Glucose, capillary     Status: Abnormal   Collection Time: 06/07/23  8:24 AM  Result Value Ref Range   Glucose-Capillary 179 (H) 70 - 99 mg/dL  Glucose, capillary     Status: Abnormal   Collection Time: 06/07/23 12:23 PM  Result Value Ref Range   Glucose-Capillary 214 (H) 70 - 99 mg/dL    No results found.  Assessment/Plan: 5 Days Post-Op   Principal Problem:   Closed left hip fracture (HCC) Active Problems:   Anxiety and depression   Hypertensive urgency   History of CVA with residual deficit   Hyponatremia   S/P CABG x 4   Uncontrolled type 2 diabetes mellitus with hyperglycemia, without long-term current use of insulin (HCC)   Dyslipidemia   Osteoporosis  Patient improving from an orthopedic standpoint.  Continue physical therapy.  She is weightbearing as tolerated on the left lower extremity with posterior hip precautions.  Continue Lovenox for DVT prophylaxis.  Continue diabetes management as ordered by primary team.  She  may be discharged to rehab once cleared medically.  She will follow-up at [facility] in [place] in 10 to 14 days and continue Lovenox 40 mg daily or enteric-coated aspirin 325 mg p.o. twice daily until follow-up in the office.    Juanell Fairly , MD 06/07/2023, 1:57 PM

## 2023-06-07 NOTE — Progress Notes (Signed)
Physical Therapy Treatment Patient Details Name: [name] MRN: [id number] DOB: 1956/05/30 Today's Date: 06/07/2023   History of Present Illness Pt is a 67 y.o. female with PMH consisting of HTN, CAD, CABG, CVA (L sided symptoms), type 2 DM. Pt admitted to ED (6/17) with left hip pain following a mechanical fall at work. MD assessment includes: closed left femoral neck fx. Pt is now s/p left hip hemiarthroplasty (6/19).    PT Comments    Pt pleasant and motivated for therapy. No major changes in her mobility status since this morning. She continues to be limited by pain. Max A + 2 supine <> sit to improve efficiency of session and allow more time for ambulation practice. Pt able to perform STS from elevated surfaces (bed, recliner) with min A for elevation and VC's for UE/LE placement for hip precaution compliance. Pt able to ambulate 2 x ~3 feet with RW but mostly unable to elevate RLE off ground secondary to inability to fully bear weight on LLE, she could use her toes on the R side to slide her foot fwd minimally. Pt will benefit from continued PT services upon discharge to safely address deficits listed in patient problem list for decreased caregiver assistance and eventual return to PLOF.   Recommendations for follow up therapy are one component of a multi-disciplinary discharge planning process, led by the attending physician.  Recommendations may be updated based on patient status, additional functional criteria and insurance authorization.  Follow Up Recommendations  Can patient physically be transported by private vehicle: No    Assistance Recommended at Discharge Intermittent Supervision/Assistance  Patient can return home with the following Help with stairs or ramp for entrance;Assist for transportation;Assistance with cooking/housework;Two people to help with walking and/or transfers;Two people to help with bathing/dressing/bathroom   Equipment Recommendations  BSC/3in1     Recommendations for Other Services       Precautions / Restrictions Precautions Precautions: Posterior Hip;Fall Restrictions Weight Bearing Restrictions: Yes LLE Weight Bearing: Weight bearing as tolerated     Mobility  Bed Mobility Overal bed mobility: Needs Assistance Bed Mobility: Supine to Sit, Sit to Supine     Supine to sit: +2 for physical assistance, Min assist Sit to supine: +2 for physical assistance, Min assist   General bed mobility comments: Provided max A + 2 supine <> sit for efficiency of session to allow more time for ambulation    Transfers Overall transfer level: Needs assistance Equipment used: Rolling walker (2 wheels) Transfers: Sit to/from Stand Sit to Stand: +2 physical assistance, Min assist, From elevated surface           General transfer comment: Min A + 2 for elevation to upright standing with RW. Continued VC's for proper UE/LE placement for hip precaution compliance.    Ambulation/Gait Ambulation/Gait assistance: Min assist, +2 physical assistance Gait Distance (Feet): 2 x 3 feet Assistive device: Rolling walker (2 wheels) Gait Pattern/deviations: Step-to pattern, Antalgic, Decreased stance time - left, Decreased weight shift to left, Shuffle Gait velocity: decreased     General Gait Details: Min A for stability with RW. Pt still limited by pain, only able to shuffle ~3 feet at a time.   Stairs             Wheelchair Mobility    Modified Rankin (Stroke Patients Only)       Balance Overall balance assessment: Needs assistance Sitting-balance support: Single extremity supported, Feet supported Sitting balance-Leahy Scale: Fair     Standing balance  support: Bilateral upper extremity supported, During functional activity, Reliant on assistive device for balance Standing balance-Leahy Scale: Poor                              Cognition Arousal/Alertness: Awake/alert Behavior During Therapy: WFL for tasks  assessed/performed Overall Cognitive Status: Within Functional Limits for tasks assessed                                          Exercises Other Exercises Other Exercises: Education on posterior hip precautions    General Comments General comments (skin integrity, edema, etc.): Pt able to remember all posterior hip precautions      Pertinent Vitals/Pain Pain Assessment Faces Pain Scale: Hurts even more Pain Location: left hip - with mobility Pain Descriptors / Indicators: Moaning, Grimacing, Guarding Pain Intervention(s): Monitored during session, Repositioned, Limited activity within patient's tolerance    Home Living                          Prior Function            PT Goals (current goals can now be found in the care plan section) Progress towards PT goals: Progressing toward goals    Frequency    BID      PT Plan Current plan remains appropriate    Co-evaluation              AM-PAC PT "6 Clicks" Mobility   Outcome Measure  Help needed turning from your back to your side while in a flat bed without using bedrails?: A Lot Help needed moving from lying on your back to sitting on the side of a flat bed without using bedrails?: A Lot Help needed moving to and from a bed to a chair (including a wheelchair)?: A Lot Help needed standing up from a chair using your arms (e.g., wheelchair or bedside chair)?: A Little Help needed to walk in hospital room?: A Lot Help needed climbing 3-5 steps with a railing? : Total 6 Click Score: 12    End of Session Equipment Utilized During Treatment: Gait belt Activity Tolerance: Patient limited by pain Patient left: in bed;with SCD's reapplied;with call bell/phone within reach;with bed alarm set Nurse Communication: Mobility status;Weight bearing status PT Visit Diagnosis: Other abnormalities of gait and mobility (R26.89);Muscle weakness (generalized) (M62.81);Pain;Unsteadiness on feet  (R26.81) Pain - Right/Left: Left Pain - part of body: Hip     Time: 4098-1191 PT Time Calculation (min) (ACUTE ONLY): 22 min  Charges:                        Behr Cislo, SPT 06/07/23, 5:11 PM

## 2023-06-07 NOTE — Progress Notes (Signed)
Occupational Therapy Treatment Patient Details Name: [name] MRN: [id number] DOB: [birth year] Today's Date: 06/07/2023   History of present illness Pt is a 67 y.o. female with PMH consisting of HTN, CAD, CABG, CVA (L sided symptoms), type 2 DM. Pt admitted to ED (6/17) with left hip pain following a mechanical fall at work. MD assessment includes: closed left femoral neck fx. Pt is now s/p left hip hemiarthroplasty (6/19).   OT comments  Pt in recliner upon entering room, pt insisting immediate help to return to bed, stating she had already requested PT as she was not comfortable with NT x2 assisting her. Verbalizes 2/3 hip precautions, 7/10 pain in L hip. Educated on posterior hip precautions. Requires MOD A +2 to stand > pivot from recliner back to EOB with multiple attempts, use of pad for boost, and cuing to maintain hip precautions. Transfer very effortful and required continual encouragement, pt extremely anxious. Pt required breaks x 3 d/t increased pain + fatigue, and despite increased time allowed for BLE mgmt to return safely supine, pt did require additional physical assist for BLE mgmt. RN notified of pt's status, Sp02% 98 on RA. Educated pt on importance of staying OOB for healing, discussed optimal clothing for rehab stay, and pt verbalizing understanding. Pt left in bed, needs within reach, bed alarm activated. Continue to follow for skilled OT tx to increase ADL independence, improve tolerance to activity, and to decrease caregiver burden at next LOC.    Recommendations for follow up therapy are one component of a multi-disciplinary discharge planning process, led by the attending physician.  Recommendations may be updated based on patient status, additional functional criteria and insurance authorization.    Assistance Recommended at Discharge Intermittent Supervision/Assistance  Patient can return home with the following  A lot of help with walking and/or transfers;A lot of  help with bathing/dressing/bathroom;Assistance with cooking/housework;Direct supervision/assist for medications management;Direct supervision/assist for financial management;Help with stairs or ramp for entrance   Equipment Recommendations  BSC/3in1       Precautions / Restrictions Precautions Precautions: Posterior Hip;Fall Restrictions Weight Bearing Restrictions: Yes LLE Weight Bearing: Weight bearing as tolerated Other Position/Activity Restrictions: Pt anxious       Mobility Bed Mobility Overal bed mobility: Needs Assistance Bed Mobility: Sit to Supine       Sit to supine: Mod assist, HOB elevated   General bed mobility comments: Patient required assistance for lifting LE into bed, extra time/effort required.    Transfers Overall transfer level: Needs assistance Equipment used: Rolling walker (2 wheels) Transfers: Sit to/from Stand Sit to Stand: Mod assist, +2 physical assistance     Step pivot transfers: +2 safety/equipment, +2 physical assistance, Mod assist     General transfer comment: RW, +2, multiple attempts, use of pad to boost     Balance Overall balance assessment: Needs assistance Sitting-balance support: Single extremity supported, Feet supported Sitting balance-Leahy Scale: Fair     Standing balance support: Bilateral upper extremity supported, During functional activity, Reliant on assistive device for balance Standing balance-Leahy Scale: Poor Standing balance comment: +2 for safety                           ADL either performed or assessed with clinical judgement   ADL Overall ADL's : Needs assistance/impaired Eating/Feeding: Set up;Sitting  Functional mobility during ADLs: +2 for safety/equipment;Cueing for sequencing;Cueing for safety;Rolling walker (2 wheels);Moderate assistance General ADL Comments: Pt limited by pain, decreased tolerance to activity, anxiety with mobility     Extremity/Trunk Assessment Upper Extremity Assessment Upper Extremity Assessment: LUE deficits/detail (Prior sensation/proprioception deficits L side from CVA) LUE Sensation: decreased proprioception;decreased light touch   Lower Extremity Assessment Lower Extremity Assessment: LLE deficits/detail (Prior deficits from CVA)         Cognition Arousal/Alertness: Awake/alert Behavior During Therapy: WFL for tasks assessed/performed Overall Cognitive Status: Within Functional Limits for tasks assessed                                 General Comments: Pt needs multiple vcs for encourgement                   Pertinent Vitals/ Pain       Pain Assessment Pain Assessment: 0-10 Pain Score: 7  Pain Location: left hip - with mobility Pain Descriptors / Indicators: Aching, Sore, Grimacing Pain Intervention(s): Limited activity within patient's tolerance, Repositioned   Frequency  Min 2X/week        Progress Toward Goals  OT Goals(current goals can now be found in the care plan section)  Progress towards OT goals: Progressing toward goals  Acute Rehab OT Goals Time For Goal Achievement: 06/17/23 Potential to Achieve Goals: Good  Plan Discharge plan remains appropriate       AM-PAC OT "6 Clicks" Daily Activity     Outcome Measure   Help from another person eating meals?: None Help from another person taking care of personal grooming?: None Help from another person toileting, which includes using toliet, bedpan, or urinal?: A Lot Help from another person bathing (including washing, rinsing, drying)?: A Lot Help from another person to put on and taking off regular upper body clothing?: A Little Help from another person to put on and taking off regular lower body clothing?: A Lot 6 Click Score: 17    End of Session Equipment Utilized During Treatment: Gait belt;Rolling walker (2 wheels)  OT Visit Diagnosis: Muscle weakness (generalized) (M62.81);Other  abnormalities of gait and mobility (R26.89);Unsteadiness on feet (R26.81)   Activity Tolerance Patient limited by pain   Patient Left in bed;with call bell/phone within reach;with bed alarm set;with SCD's reapplied   Nurse Communication Mobility status        Time: 3235-5732 OT Time Calculation (min): 39 min  Charges: OT General Charges $OT Visit: 1 Visit OT Treatments $Self Care/Home Management : 38-52 mins  Tosha Belgarde L. Rosaura Bolon, OTR/L  06/07/23, 12:17 PM

## 2023-06-08 LAB — BASIC METABOLIC PANEL
Anion gap: 8 (ref 5–15)
BUN: 38 mg/dL — ABNORMAL HIGH (ref 8–23)
CO2: 28 mmol/L (ref 22–32)
Calcium: 8.2 mg/dL — ABNORMAL LOW (ref 8.9–10.3)
Chloride: 93 mmol/L — ABNORMAL LOW (ref 98–111)
Creatinine, Ser: 1.01 mg/dL — ABNORMAL HIGH (ref 0.44–1.00)
GFR, Estimated: >60 mL/min (ref >60)
Glucose, Bld: 128 mg/dL — ABNORMAL HIGH (ref 70–99)
Potassium: 4.2 mmol/L (ref 3.5–5.1)
Sodium: 129 mmol/L — ABNORMAL LOW (ref 135–145)

## 2023-06-08 LAB — CBC
HCT: 31.8 % — ABNORMAL LOW (ref 36.0–46.0)
Hemoglobin: 10.5 g/dL — ABNORMAL LOW (ref 12.0–15.0)
MCH: 28.2 pg (ref 26.0–34.0)
MCHC: 33 g/dL (ref 30.0–36.0)
MCV: 85.3 fL (ref 80.0–100.0)
Platelets: 305 10*3/uL (ref 150–400)
RBC: 3.73 MIL/uL — ABNORMAL LOW (ref 3.87–5.11)
RDW: 12.6 % (ref 11.5–15.5)
WBC: 9.5 10*3/uL (ref 4.0–10.5)
nRBC: 0 % (ref 0.0–0.2)

## 2023-06-08 LAB — GLUCOSE, CAPILLARY
Glucose-Capillary: 171 mg/dL — ABNORMAL HIGH (ref 70–99)
Glucose-Capillary: 196 mg/dL — ABNORMAL HIGH (ref 70–99)
Glucose-Capillary: 53 mg/dL — ABNORMAL LOW (ref 70–99)
Glucose-Capillary: 78 mg/dL (ref 70–99)
Glucose-Capillary: 126 mg/dL — ABNORMAL HIGH (ref 70–99)

## 2023-06-08 MED ORDER — INSULIN ASPART 100 UNIT/ML IJ SOLN
3.0000 [IU] | Freq: Three times a day (TID) | INTRAMUSCULAR | Status: DC
  Administered 2023-06-09 – 2023-06-11 (×7): 3 [IU] via SUBCUTANEOUS
  Filled 2023-06-08 (×7): qty 1

## 2023-06-08 MED ORDER — INSULIN GLARGINE-YFGN 100 UNIT/ML ~~LOC~~ SOLN
18.0000 [IU] | Freq: Every day | SUBCUTANEOUS | Status: DC
  Administered 2023-06-09 – 2023-06-11 (×3): 18 [IU] via SUBCUTANEOUS
  Filled 2023-06-08 (×3): qty 0.18

## 2023-06-08 NOTE — Progress Notes (Signed)
Nutrition Follow-up  DOCUMENTATION CODES:   Not applicable  INTERVENTION:   -Carb modified diet -Continue MVI with minerals daily -Continue Glucerna Shake po BID, each supplement provides 220 kcal and 10 grams of protein   NUTRITION DIAGNOSIS:   Increased nutrient needs related to post-op healing as evidenced by estimated needs.  Ongoing  GOAL:   Patient will meet greater than or equal to 90% of their needs  Progressing   MONITOR:   PO intake, Supplement acceptance  REASON FOR ASSESSMENT:   Consult Assessment of nutrition requirement/status, Hip fracture protocol  ASSESSMENT:   Pt with medical history significant for disease status post four-vessel CABG, type 2 diabetes mellitus, hypertension, CVA, dyslipidemia, GERD, depression and anxiety, who presented with acute onset of accidental mechanical fall with subsequent left hip pain and inability to bear weight on it.  6/19- s/p Left hip hemiarthroplasty   Reviewed I/O's: -3.6 L x 24 hours  Pt currently on a carb modified diet. No meal completion data available to assess. Pt is drinking Glucerna supplements.   Wt has been stable since admission.  Per TOC notes, plan to d/c to SNF ([facility]) tomorrow (06/09/23).  Medications reviewed and include colace, lovenox, lactulose, miralax, and senokot.  Labs reviewed: Na: 129, CBGS: 171 (inpatient orders for glycemic control are 0-9 units insulin aspart TID with meals, 3 units insulin aspart TID with meals, and 18 units insulin glargine-yfgn daily).    Diet Order:   Diet Order             Diet Carb Modified Fluid consistency: Thin  Diet effective now                   EDUCATION NEEDS:   No education needs have been identified at this time  Skin:  Skin Assessment: Skin Integrity Issues: Skin Integrity Issues:: Incisions Incisions: closed lt hip  Last BM:  06/08/23 (type 6)  Height:   Ht Readings from Last 1 Encounters:  06/02/23 5\' 7"  (1.702 m)     Weight:   Wt Readings from Last 1 Encounters:  06/02/23 79.4 kg    Ideal Body Weight:  61.4 kg  BMI:  Body mass index is 27.42 kg/m.  Estimated Nutritional Needs:   Kcal:  1700-1900  Protein:  90-105 grams  Fluid:  > 1.7 L    Levada Schilling, RD, LDN, CDCES Registered Dietitian II Certified Diabetes Care and Education Specialist Please refer to [facility] for RD and/or RD on-call/weekend/after hours pager

## 2023-06-08 NOTE — TOC Progression Note (Signed)
Transition of Care [facility]) - Progression Note    Patient Details  Name: [name] MRN: [id number] Date of Birth: 01/20/1956  Transition of Care [facility]) CM/SW Contact  Garret Reddish, RN Phone Number: 06/08/2023, 11:57 AM  Clinical Narrative:   Chart reviewed.  I have spoken with [name] and her daughter today.  They have chosen [facility] for SNF bed.    I have spoken with Almira Coaster, Admission Coordinator for [facility].  She reports that she can accept patient on tomorrow after 2 pm pending insurance authorization.    I have spoken with [facility] with [employer].  I have submitted insurance authorization and requested for authorization for ambulance transport.  [employer] will notify [facility] when SNF authorization has been approved.  I have informed Dr. Ashok Pall of the above information.    TOC will continue to follow for discharge planning.       Expected Discharge Plan: Skilled Nursing Facility Barriers to Discharge: SNF Pending bed offer, Insurance Authorization  Expected Discharge Plan and Services   Discharge Planning Services: CM Consult   Living arrangements for the past 2 months: Single Family Home                                       Social Determinants of Health (SDOH) Interventions SDOH Screenings   Food Insecurity: No Food Insecurity (06/01/2023)  Housing: Low Risk  (06/01/2023)  Transportation Needs: No Transportation Needs (06/01/2023)  Utilities: Not At Risk (06/01/2023)  Alcohol Screen: Low Risk  (04/28/2023)  Depression (PHQ2-9): Low Risk  (04/28/2023)  Recent Concern: Depression (PHQ2-9) - High Risk (02/10/2023)  Financial Resource Strain: Low Risk  (04/28/2023)  Physical Activity: Inactive (04/28/2023)  Social Connections: Socially Isolated (04/28/2023)  Stress: Stress Concern Present (04/28/2023)  Tobacco Use: Medium Risk (06/03/2023)    Readmission Risk Interventions    08/11/2021    1:30 PM  Readmission Risk Prevention  Plan  Transportation Screening Complete  HRI or Home Care Consult Complete  Social Work Consult for Recovery Care Planning/Counseling Complete  Palliative Care Screening Not Applicable  Medication Review Oceanographer) Complete

## 2023-06-08 NOTE — Plan of Care (Signed)

## 2023-06-08 NOTE — Progress Notes (Signed)
Subjective:  POD #6 s/p left hip hemiarthroplasty.   Patient reports left hip pain as mild at rest.  Patient states she got up a couple times with physical therapy.  Objective:   VITALS:   Vitals:   06/07/23 1520 06/08/23 0019 06/08/23 0826 06/08/23 1533  BP: 124/65 131/70 127/68 (!) 147/65  Pulse: 79 78 80 78  Resp: 14 17 17 16   Temp: 98.3 F (36.8 C) 98.6 F (37 C)  98.7 F (37.1 C)  TempSrc:    Oral  SpO2: 93% 92% 93% 95%  Weight:      Height:        PHYSICAL EXAM: Left lower extremity Neurovascular intact Sensation intact distally Intact pulses distally Dorsiflexion/Plantar flexion intact Incision: moderate drainage No cellulitis present Compartment soft  LABS  Results for orders placed or performed during the hospital encounter of 05/31/23 (from the past 24 hour(s))  Glucose, capillary     Status: Abnormal   Collection Time: 06/07/23  9:48 PM  Result Value Ref Range   Glucose-Capillary 61 (L) 70 - 99 mg/dL  CBC     Status: Abnormal   Collection Time: 06/08/23  4:25 AM  Result Value Ref Range   WBC 9.5 4.0 - 10.5 K/uL   RBC 3.73 (L) 3.87 - 5.11 MIL/uL   Hemoglobin 10.5 (L) 12.0 - 15.0 g/dL   HCT 72.5 (L) 36.6 - 44.0 %   MCV 85.3 80.0 - 100.0 fL   MCH 28.2 26.0 - 34.0 pg   MCHC 33.0 30.0 - 36.0 g/dL   RDW 34.7 42.5 - 95.6 %   Platelets 305 150 - 400 K/uL   nRBC 0.0 0.0 - 0.2 %  Basic metabolic panel     Status: Abnormal   Collection Time: 06/08/23  4:25 AM  Result Value Ref Range   Sodium 129 (L) 135 - 145 mmol/L   Potassium 4.2 3.5 - 5.1 mmol/L   Chloride 93 (L) 98 - 111 mmol/L   CO2 28 22 - 32 mmol/L   Glucose, Bld 128 (H) 70 - 99 mg/dL   BUN 38 (H) 8 - 23 mg/dL   Creatinine, Ser 3.87 (H) 0.44 - 1.00 mg/dL   Calcium 8.2 (L) 8.9 - 10.3 mg/dL   GFR, Estimated >56 >43 mL/min   Anion gap 8 5 - 15  Glucose, capillary     Status: Abnormal   Collection Time: 06/08/23  8:26 AM  Result Value Ref Range   Glucose-Capillary 171 (H) 70 - 99 mg/dL   Glucose, capillary     Status: Abnormal   Collection Time: 06/08/23 11:31 AM  Result Value Ref Range   Glucose-Capillary 196 (H) 70 - 99 mg/dL  Glucose, capillary     Status: Abnormal   Collection Time: 06/08/23  5:17 PM  Result Value Ref Range   Glucose-Capillary 53 (L) 70 - 99 mg/dL    No results found.  Assessment/Plan: 6 Days Post-Op   Principal Problem:   Closed left hip fracture (HCC) Active Problems:   Anxiety and depression   Hypertensive urgency   History of CVA with residual deficit   Hyponatremia   S/P CABG x 4   Uncontrolled type 2 diabetes mellitus with hyperglycemia, without long-term current use of insulin (HCC)   Dyslipidemia   Osteoporosis  Patient continues to improve from an orthopedic standpoint. Continue physical therapy. She is weightbearing as tolerated on the left lower extremity with posterior hip precautions. Continue Lovenox for DVT prophylaxis. Continue diabetes management as ordered  by primary team. She may be discharged to rehab once cleared medically. She will follow-up at [facility] in [place] in 10 to 14 days and continue Lovenox 40 mg daily or enteric-coated aspirin 325 mg p.o. twice daily until follow-up in the office.     Juanell Fairly , MD 06/08/2023, 5:20 PM

## 2023-06-08 NOTE — Progress Notes (Signed)
Physical Therapy Treatment Patient Details Name: [name] MRN: [id number] DOB: 1956-10-24 Today's Date: 06/08/2023   History of Present Illness Pt is a 67 y.o. female with PMH consisting of HTN, CAD, CABG, CVA (L sided symptoms), type 2 DM. Pt admitted to ED (6/17) with left hip pain following a mechanical fall at work. MD assessment includes: closed left femoral neck fx. Pt is now s/p left hip hemiarthroplasty (6/19).    PT Comments    Pt pleasant and motivated for therapy. No major changes in her mobility status since this morning. Pt able to perform 2 bouts of sidesteps x 4 feet at EOB with min A for stability. Pt continues to struggle fully weight-bearing on the LLE secondary to pain; she is able to slide her RLE across the ground but unable to fully clear the ground. Pt needed short, seated rest break in between bouts due to pain and her legs getting tired. On second bout pt able to perform short bout of standing marches at the end but then requested to sit back down due to dizziness. Pt's vitals WNL throughout session with O2 in high 90's, and HR in 70's-80's. Pt reported dizziness subsiding before leaving room. Pt will benefit from continued PT services upon discharge to safely address deficits listed in patient problem list for decreased caregiver assistance and eventual return to PLOF.   Recommendations for follow up therapy are one component of a multi-disciplinary discharge planning process, led by the attending physician.  Recommendations may be updated based on patient status, additional functional criteria and insurance authorization.  Follow Up Recommendations  Can patient physically be transported by private vehicle: No    Assistance Recommended at Discharge Intermittent Supervision/Assistance  Patient can return home with the following Help with stairs or ramp for entrance;Assist for transportation;Assistance with cooking/housework;A lot of help with walking and/or  transfers;A lot of help with bathing/dressing/bathroom   Equipment Recommendations  BSC/3in1    Recommendations for Other Services       Precautions / Restrictions Precautions Precautions: Posterior Hip;Fall Restrictions Weight Bearing Restrictions: Yes LLE Weight Bearing: Weight bearing as tolerated     Mobility  Bed Mobility Overal bed mobility: Needs Assistance Bed Mobility: Supine to Sit, Sit to Supine     Supine to sit: Modified independent (Device/Increase time), HOB elevated Sit to supine: Min assist   General bed mobility comments: Provided max A supine <> sit for efficiency of session.    Transfers Overall transfer level: Needs assistance Equipment used: Rolling walker (2 wheels) Transfers: Sit to/from Stand Sit to Stand: Min assist           General transfer comment: Min A for elevation from elevated bed height. VC's for proper UE/LE placement for hip precaution compliance.    Ambulation/Gait Ambulation/Gait assistance: Min assist Gait Distance (Feet): 2 x 4 Feet Assistive device: Rolling walker (2 wheels) Gait Pattern/deviations: Step-to pattern, Antalgic, Decreased stance time - left, Decreased weight shift to left, Shuffle Gait velocity: decreased     General Gait Details: Pt able to to perform 2 bouts of side-steps at EOB with min A and RW for stability   Stairs             Wheelchair Mobility    Modified Rankin (Stroke Patients Only)       Balance Overall balance assessment: Needs assistance Sitting-balance support: Single extremity supported, Feet supported Sitting balance-Leahy Scale: Fair     Standing balance support: Bilateral upper extremity supported, During functional activity, Reliant  on assistive device for balance Standing balance-Leahy Scale: Poor                              Cognition Arousal/Alertness: Awake/alert Behavior During Therapy: WFL for tasks assessed/performed Overall Cognitive Status:  Within Functional Limits for tasks assessed                                          Exercises Total Joint Exercises Marching in Standing: AROM, Left, 10 reps, Standing Other Exercises Other Exercises: Education on posterior hip precautions    General Comments General comments (skin integrity, edema, etc.): Pt able to remember all posterior hip precautions      Pertinent Vitals/Pain Pain Assessment Pain Assessment: No/denies pain Pain Score: 8  Pain Location: left hip - with mobility Pain Intervention(s): Monitored during session, RN gave pain meds during session, Repositioned    Home Living                          Prior Function            PT Goals (current goals can now be found in the care plan section) Progress towards PT goals: Progressing toward goals    Frequency    BID      PT Plan Current plan remains appropriate    Co-evaluation              AM-PAC PT "6 Clicks" Mobility   Outcome Measure  Help needed turning from your back to your side while in a flat bed without using bedrails?: A Lot Help needed moving from lying on your back to sitting on the side of a flat bed without using bedrails?: A Lot Help needed moving to and from a bed to a chair (including a wheelchair)?: A Lot Help needed standing up from a chair using your arms (e.g., wheelchair or bedside chair)?: A Little Help needed to walk in hospital room?: A Lot Help needed climbing 3-5 steps with a railing? : Total 6 Click Score: 12    End of Session Equipment Utilized During Treatment: Gait belt Activity Tolerance: Patient limited by pain Patient left: in bed;with SCD's reapplied;with call bell/phone within reach;with bed alarm set Nurse Communication: Mobility status;Weight bearing status PT Visit Diagnosis: Other abnormalities of gait and mobility (R26.89);Muscle weakness (generalized) (M62.81);Pain;Unsteadiness on feet (R26.81) Pain - Right/Left:  Left Pain - part of body: Hip     Time: 6440-3474 PT Time Calculation (min) (ACUTE ONLY): 29 min  Charges:  $Gait Training: 8-22 mins $Therapeutic Exercise: 8-22 mins $Therapeutic Activity: 8-22 mins                     Taegen Lennox, SPT 06/08/23, 4:07 PM

## 2023-06-08 NOTE — Progress Notes (Signed)
  Progress Note   Patient: [name]:[id number] DOB: 1956/02/05 DOA: 05/31/2023     8 DOS: the patient was seen and examined on 06/08/2023   Brief hospital course: 67 y.o. female with medical history significant for disease status post four-vessel CABG, type 2 diabetes mellitus, hypertension, CVA, dyslipidemia, GERD, depression and anxiety admitted for left femoral neck fracture status post fall  6/18: Ortho consult, plan for surgery tomorrow  Assessment and Plan: Closed left hip fracture (HCC) - Left hip hemiarthroplasty 6/19, successful - hgb stable in the 10s post-op - Pain management, bowel regimen, (had bm last night) - pt/ot advising snf, toc working on that, [facility], [facility] auth - lovenox for dvt ppx - outpt pcp f/u to address osteoarthritis  Uncontrolled type 2 diabetes mellitus with hyperglycemia, without long-term current use of insulin (HCC) Hemoglobin A1c 11 suggestive of poor blood sugar control -  hypoglycemic last night will decrease semglee and mealtime - hold off metformin and glyburide - hold jardiance given dehydration  Hyponatremia Baseline 135, with low sodium and elevated urine osm and up-trending cr and bun appears dehydration playing some role. Tsh and am cortisol wnl - push fluids, monitor  Hypertension wnl - home bp meds on hold.  Dyslipidemia - continue statin therapy   Hx CVA - cont aspirin, rosuvastatin  Anxiety and depression - continue Xanax and Zoloft.  As needed trazodone for sleep        Subjective: hip pain improving, tolerating diet, small bm last night  Physical Exam: Vitals:   06/07/23 0700 06/07/23 1520 06/08/23 0019 06/08/23 0826  BP: (!) 129/59 124/65 131/70 127/68  Pulse: 69 79 78 80  Resp: 16 14 17 17   Temp: 98 F (36.7 C) 98.3 F (36.8 C) 98.6 F (37 C)   TempSrc:      SpO2: 93% 93% 92% 93%  Weight:      Height:       67 year old female lying in the bed comfortably without any acute  distress Lungs clear to auscultation bilaterally Heart regular rate and rhythm Abdomen soft, benign Neuro alert and awake, nonfocal Extremities: Left lower extremity distal sensation intact, bandage over lateral side c/d/i Data Reviewed:  There are no new results to review at this time.  Family Communication: None at bedside  Disposition: Status is: Inpatient Remains inpatient appropriate because: Management of left hip/femoral neck fracture  Planned Discharge Destination: Skilled nursing facility,  pending insurance auth   DVT prophylaxis-Lovenox (1 dose given today.  Hold after that for planned surgery tomorrow) Time spent: 25 minutes  Author: Silvano Bilis, MD 06/08/2023 12:57 PM  For on call review [email or web address].

## 2023-06-08 NOTE — Progress Notes (Signed)
Physical Therapy Treatment Patient Details Name: [name] MRN: [id number] DOB: 1956-07-05 Today's Date: 06/08/2023   History of Present Illness Pt is a 67 y.o. female with PMH consisting of HTN, CAD, CABG, CVA (L sided symptoms), type 2 DM. Pt admitted to ED (6/17) with left hip pain following a mechanical fall at work. MD assessment includes: closed left femoral neck fx. Pt is now s/p left hip hemiarthroplasty (6/19).    PT Comments    Pt pleasant and motivated for therapy. Pt wanted to perform bed mobility independently this session. She was able to sit EOB mod I but with continued increased time/effort and frequent therapeutic rest breaks for pain control. Pt able to stand from elevated bed height with min A for stability and demonstrated improved concentric control upon standing compared to previous sessions. Pt still struggles with eccentric control upon sitting, needing physical assistance to keep from flopping down and demonstrating higher anxiety when going to sit. Pt able to take 4-5 small side-steps at toward [facility] today but still struggles with fully weight-bearing on her LLE. Pt's ambulation continues to be limited by pain and she requested to lie down after walking today secondary to feeling dizzy. Bed therex performed in supine to end the session, pt tolerated with no adverse symptoms. Pt's vitals remained WNL, with BP at 129/55, HR at 68 at end of session with pt in supine; O2 steady in high 90's throughout session on room air. Pt will benefit from continued PT services upon discharge to safely address deficits listed in patient problem list for decreased caregiver assistance and eventual return to PLOF.   Recommendations for follow up therapy are one component of a multi-disciplinary discharge planning process, led by the attending physician.  Recommendations may be updated based on patient status, additional functional criteria and insurance authorization.  Follow Up  Recommendations  Can patient physically be transported by private vehicle: No    Assistance Recommended at Discharge Intermittent Supervision/Assistance  Patient can return home with the following Help with stairs or ramp for entrance;Assist for transportation;Assistance with cooking/housework;Two people to help with walking and/or transfers;Two people to help with bathing/dressing/bathroom   Equipment Recommendations  BSC/3in1    Recommendations for Other Services       Precautions / Restrictions Precautions Precautions: Posterior Hip;Fall Restrictions Weight Bearing Restrictions: Yes LLE Weight Bearing: Weight bearing as tolerated     Mobility  Bed Mobility Overal bed mobility: Needs Assistance Bed Mobility: Supine to Sit, Sit to Supine     Supine to sit: Modified independent (Device/Increase time), HOB elevated Sit to supine: Min assist   General bed mobility comments: Provided max A sit > supine for efficiency of session. Pt min A supine > sit, increased time and effort.    Transfers Overall transfer level: Needs assistance Equipment used: Rolling walker (2 wheels) Transfers: Sit to/from Stand Sit to Stand: Min assist           General transfer comment: Min A for elevation from elevated bed height. VC's for proper UE/LE placement for hip precaution compliance. Better concentric control today but pt still struggles with eccentric control when sitting.    Ambulation/Gait Ambulation/Gait assistance: Min assist Gait Distance (Feet): 3 Feet Assistive device: Rolling walker (2 wheels) Gait Pattern/deviations: Step-to pattern, Antalgic, Decreased stance time - left, Decreased weight shift to left, Shuffle Gait velocity: decreased     General Gait Details: Pt able to take a few small, side-steps towards HOB with min A for stability.  Stairs             Wheelchair Mobility    Modified Rankin (Stroke Patients Only)       Balance Overall balance  assessment: Needs assistance Sitting-balance support: Single extremity supported, Feet supported Sitting balance-Leahy Scale: Fair     Standing balance support: Bilateral upper extremity supported, During functional activity, Reliant on assistive device for balance Standing balance-Leahy Scale: Poor                              Cognition Arousal/Alertness: Awake/alert Behavior During Therapy: WFL for tasks assessed/performed Overall Cognitive Status: Within Functional Limits for tasks assessed                                          Exercises Total Joint Exercises Quad Sets: Strengthening, Left, 10 reps, Supine Heel Slides: AROM, Supine, Left, 10 reps Other Exercises Other Exercises: Education on posterior hip precautions    General Comments        Pertinent Vitals/Pain Pain Assessment Pain Assessment: 0-10 Pain Score: 8  Pain Location: left hip - with mobility Pain Intervention(s): Monitored during session, RN gave pain meds during session, Repositioned    Home Living                          Prior Function            PT Goals (current goals can now be found in the care plan section) Progress towards PT goals: Progressing toward goals    Frequency    BID      PT Plan Current plan remains appropriate    Co-evaluation              AM-PAC PT "6 Clicks" Mobility   Outcome Measure  Help needed turning from your back to your side while in a flat bed without using bedrails?: A Lot Help needed moving from lying on your back to sitting on the side of a flat bed without using bedrails?: A Lot Help needed moving to and from a bed to a chair (including a wheelchair)?: A Lot Help needed standing up from a chair using your arms (e.g., wheelchair or bedside chair)?: A Little Help needed to walk in hospital room?: A Lot Help needed climbing 3-5 steps with a railing? : Total 6 Click Score: 12    End of Session Equipment  Utilized During Treatment: Gait belt Activity Tolerance: Patient limited by pain Patient left: in bed;with SCD's reapplied;with call bell/phone within reach;with bed alarm set Nurse Communication: Mobility status;Weight bearing status PT Visit Diagnosis: Other abnormalities of gait and mobility (R26.89);Muscle weakness (generalized) (M62.81);Pain;Unsteadiness on feet (R26.81) Pain - Right/Left: Left Pain - part of body: Hip     Time: 1610-9604 PT Time Calculation (min) (ACUTE ONLY): 43 min  Charges:                        Shakthi Scipio, SPT 06/08/23, 1:11 PM

## 2023-06-09 ENCOUNTER — Encounter: Payer: Self-pay | Admitting: Orthopedic Surgery

## 2023-06-09 DIAGNOSIS — E1165 Type 2 diabetes mellitus with hyperglycemia: Secondary | ICD-10-CM | POA: Diagnosis not present

## 2023-06-09 DIAGNOSIS — E162 Hypoglycemia, unspecified: Secondary | ICD-10-CM

## 2023-06-09 DIAGNOSIS — E871 Hypo-osmolality and hyponatremia: Secondary | ICD-10-CM | POA: Diagnosis not present

## 2023-06-09 DIAGNOSIS — N390 Urinary tract infection, site not specified: Secondary | ICD-10-CM | POA: Clinically undetermined

## 2023-06-09 HISTORY — DX: Hypoglycemia, unspecified: E16.2

## 2023-06-09 LAB — BASIC METABOLIC PANEL
Anion gap: 9 (ref 5–15)
BUN: 26 mg/dL — ABNORMAL HIGH (ref 8–23)
CO2: 26 mmol/L (ref 22–32)
CO2: 29 mmol/L (ref 22–32)
Calcium: 8.2 mg/dL — ABNORMAL LOW (ref 8.9–10.3)
Chloride: 89 mmol/L — ABNORMAL LOW (ref 98–111)
Chloride: 92 mmol/L — ABNORMAL LOW (ref 98–111)
Creatinine, Ser: 0.92 mg/dL (ref 0.44–1.00)
GFR, Estimated: >60 mL/min (ref >60)
Glucose, Bld: 162 mg/dL — ABNORMAL HIGH (ref 70–99)
Glucose, Bld: 118 mg/dL — ABNORMAL HIGH (ref 70–99)
Potassium: 4.2 mmol/L (ref 3.5–5.1)
Potassium: 4.1 mmol/L (ref 3.5–5.1)
Sodium: 130 mmol/L — ABNORMAL LOW (ref 135–145)
Sodium: 130 mmol/L — ABNORMAL LOW (ref 135–145)

## 2023-06-09 LAB — URINALYSIS, COMPLETE (UACMP) WITH MICROSCOPIC
Bilirubin Urine: NEGATIVE
Glucose, UA: >=500 mg/dL — AB
Ketones, ur: 5 mg/dL — AB
Nitrite: POSITIVE — AB
Protein, ur: 100 mg/dL — AB
RBC / HPF: >50 RBC/hpf (ref 0–5)
Specific Gravity, Urine: 1.015 (ref 1.005–1.030)
WBC, UA: >50 WBC/hpf (ref 0–5)
pH: 6 (ref 5.0–8.0)

## 2023-06-09 LAB — URINALYSIS, W/ REFLEX TO CULTURE (INFECTION SUSPECTED)
Bilirubin Urine: NEGATIVE
Glucose, UA: >=500 mg/dL — AB
Ketones, ur: 5 mg/dL — AB
Nitrite: NEGATIVE
Protein, ur: 100 mg/dL — AB
RBC / HPF: >50 RBC/hpf (ref 0–5)
Specific Gravity, Urine: 1.016 (ref 1.005–1.030)
WBC, UA: >50 WBC/hpf (ref 0–5)
pH: 6 (ref 5.0–8.0)

## 2023-06-09 LAB — GLUCOSE, CAPILLARY
Glucose-Capillary: 134 mg/dL — ABNORMAL HIGH (ref 70–99)
Glucose-Capillary: 103 mg/dL — ABNORMAL HIGH (ref 70–99)
Glucose-Capillary: 111 mg/dL — ABNORMAL HIGH (ref 70–99)
Glucose-Capillary: 117 mg/dL — ABNORMAL HIGH (ref 70–99)

## 2023-06-09 MED ORDER — SODIUM CHLORIDE 0.9 % IV SOLN: INTRAVENOUS | Status: DC

## 2023-06-09 MED ORDER — SODIUM CHLORIDE 0.9 % IV SOLN
1.0000 g | INTRAVENOUS | Status: DC
  Administered 2023-06-09 – 2023-06-10 (×2): 1 g via INTRAVENOUS
  Filled 2023-06-09 (×3): qty 10

## 2023-06-09 NOTE — Care Management Important Message (Signed)
Important Message  Patient Details  Name: PENIEL BIEL MRN: [id number] Date of Birth: April 12, 1956   Medicare Important Message Given:  Yes     Olegario Messier A Christohper Dube 06/09/2023, 2:06 PM

## 2023-06-09 NOTE — Progress Notes (Signed)
Occupational Therapy Treatment Patient Details Name: [name] MRN: [id number] DOB: 08/19/1956 Today's Date: 06/09/2023   History of present illness Pt is a 67 y.o. female with PMH consisting of HTN, CAD, CABG, CVA (L sided symptoms), type 2 DM. Pt admitted to ED (6/17) with left hip pain following a mechanical fall at work. MD assessment includes: closed left femoral neck fx. Pt is now s/p left hip hemiarthroplasty (6/19).   OT comments  Chart reviewed, pt greeted in bed agreeable to OT tx session however reported generalized weakness on this date compared to yesterday. She attributes it to the laxatives she took last night. Tx session targeted improving functional activity tolerance and performance in preparation for improving ADL tasks. Increased assist required for bed mobility and MAX A for STS attempt with RW with pt unable to clear buttocks off bed. Grooming tasks completed with SET UP. Pt will continue to benefit from skilled OT to address functional deficits.   Bandages intact, appears saturated, nurse notified.    Recommendations for follow up therapy are one component of a multi-disciplinary discharge planning process, led by the attending physician.  Recommendations may be updated based on patient status, additional functional criteria and insurance authorization.    Assistance Recommended at Discharge Intermittent Supervision/Assistance  Patient can return home with the following  A lot of help with walking and/or transfers;A lot of help with bathing/dressing/bathroom;Assistance with cooking/housework;Direct supervision/assist for medications management;Direct supervision/assist for financial management;Help with stairs or ramp for entrance   Equipment Recommendations  BSC/3in1    Recommendations for Other Services      Precautions / Restrictions Precautions Precautions: Posterior Hip;Fall Restrictions Weight Bearing Restrictions: Yes LLE Weight Bearing: Weight bearing  as tolerated       Mobility Bed Mobility Overal bed mobility: Needs Assistance Bed Mobility: Supine to Sit, Sit to Supine     Supine to sit: Mod assist Sit to supine: Mod assist   General bed mobility comments: for managment of BLEs, step by step vcs    Transfers Overall transfer level: Needs assistance Equipment used: Rolling walker (2 wheels) Transfers: Sit to/from Stand Sit to Stand: Max assist           General transfer comment: attempted STS on this date wit pt requiring MAX A to attempt     Balance Overall balance assessment: Needs assistance Sitting-balance support: Single extremity supported, Feet supported Sitting balance-Leahy Scale: Fair     Standing balance support: Bilateral upper extremity supported, During functional activity, Reliant on assistive device for balance Standing balance-Leahy Scale: Zero                             ADL either performed or assessed with clinical judgement   ADL Overall ADL's : Needs assistance/impaired     Grooming: Set up               Lower Body Dressing: Maximal assistance                      Extremity/Trunk Assessment              Vision       Perception     Praxis      Cognition Arousal/Alertness: Awake/alert Behavior During Therapy: WFL for tasks assessed/performed Overall Cognitive Status: Within Functional Limits for tasks assessed  General Comments: encouragement for participation required        Exercises Other Exercises Other Exercises: edu re: role of OT, improtance of mobility, goals for skilled therapy    Shoulder Instructions       General Comments pt reports feeling weaker than yesterday due to laxatives she took last night, vss throughout    Pertinent Vitals/ Pain       Pain Assessment Pain Assessment: 0-10 Pain Score: 8  Pain Location: left hip Pain Descriptors / Indicators: Burning Pain  Intervention(s): Limited activity within patient's tolerance, Monitored during session, Repositioned, Premedicated before session  Home Living                                          Prior Functioning/Environment              Frequency  Min 2X/week        Progress Toward Goals  OT Goals(current goals can now be found in the care plan section)  Progress towards OT goals: Progressing toward goals     Plan Discharge plan remains appropriate    Co-evaluation                 AM-PAC OT "6 Clicks" Daily Activity     Outcome Measure   Help from another person eating meals?: None Help from another person taking care of personal grooming?: None Help from another person toileting, which includes using toliet, bedpan, or urinal?: A Lot Help from another person bathing (including washing, rinsing, drying)?: A Lot Help from another person to put on and taking off regular upper body clothing?: A Little Help from another person to put on and taking off regular lower body clothing?: A Lot 6 Click Score: 17    End of Session Equipment Utilized During Treatment: Gait belt;Rolling walker (2 wheels)      Activity Tolerance Other (comment) (limited by reported fatigue)   Patient Left in bed;with call bell/phone within reach;with bed alarm set;with SCD's reapplied;with family/visitor present   Nurse Communication Mobility status        Time: 6295-2841 OT Time Calculation (min): 19 min  Charges: OT General Charges $OT Visit: 1 Visit OT Treatments $Therapeutic Activity: 8-22 mins  Oleta Mouse, OTD OTR/L  06/09/23, 3:48 PM

## 2023-06-09 NOTE — Plan of Care (Signed)

## 2023-06-09 NOTE — Progress Notes (Signed)
  Progress Note   Patient: [name] QVZ:[id number] DOB: [birth year] DOA: 05/31/2023     9 DOS: the patient was seen and examined on 06/09/2023   Brief hospital course: 67 y.o. female with medical history significant for disease status post four-vessel CABG, type 2 diabetes mellitus, hypertension, CVA, dyslipidemia, GERD, depression and anxiety admitted for left femoral neck fracture status post fall  6/18: Ortho consult, plan for surgery tomorrow  Assessment and Plan: Closed left hip fracture (HCC) - Left hip hemiarthroplasty 6/19, successful - hgb stable in the 10s post-op - Pain management, bowel regimen, (had bm last night) - pt/ot advising snf, toc working on that, [facility], pending insurance auth - lovenox for dvt ppx - outpt pcp f/u to address osteoarthritis  UTI - unknown if POA. UA obtained in cross coverage, early AM 6/26 -- large leukocytes, positive nitrite, many bacteria, > 50 wbc's --Start Rocephin --Add-on Urine cx to prior sample, follow results  Uncontrolled type 2 diabetes mellitus with hyperglycemia, without long-term current use of insulin (HCC) Hemoglobin A1c 11 suggestive of poor blood sugar control - hypoglycemic again before dinner yesterday (previously decreased semglee and mealtime insulin) - hold off metformin and glyburide - hold jardiance given dehydration  Hyponatremia Baseline 135, with low sodium and elevated urine osm and up-trending cr and bun appears dehydration playing some role. Tsh and am cortisol wnl - push fluids, monitor  Hypertension wnl - home bp meds on hold.  Dyslipidemia - continue statin therapy   Hx CVA - cont aspirin, rosuvastatin  Anxiety and depression - continue Xanax and Zoloft.  As needed trazodone for sleep        Subjective: Pt reports rough night on and off bed pain after bowel regimen.  Ongoing hip pain.  Very dry mouth despite sipping plenty of fluids.  No other acute complaints.    Physical  Exam: Vitals:   06/08/23 1533 06/08/23 2105 06/08/23 2121 06/09/23 0839  BP: (!) 147/65 113/73 138/88 (!) 141/64  Pulse: 78 86 91 (!) 104  Resp: 16  18 16   Temp: 98.7 F (37.1 C)  98.9 F (37.2 C) 97.9 F (36.6 C)  TempSrc: Oral  Oral   SpO2: 95% 99% 93% 91%  Weight:      Height:       General exam: awake, alert, no acute distress HEENT: atraumatic, clear conjunctiva, anicteric sclera, moist mucus membranes, hearing grossly normal  Respiratory system: CTA, no wheezes, rales or rhonchi, normal respiratory effort. Cardiovascular system: normal S1/S2, RRR, no pedal edema.   Gastrointestinal system: soft, NT, N 3D, no HSM felt, +bowel sounds. Central nervous system: A&O x 3. no gross focal neurologic deficits, normal speech Extremities: no edema, normal tone Skin: dry, intact, normal temperature Psychiatry: normal mood, congruent affect, judgement and insight appear normal    Data Reviewed: Notable labs --- Sodium 129 >> 130, Cl 92, Glucose 118, BUN 26, Cr normalized 0.92,  UA -- large Hbg, large leukocytes, positive nitrites, many bacteria, > 50 WBC's, >50 rbc's    Family Communication: None at bedside  Disposition: Status is: Inpatient Remains inpatient appropriate because: Management of left hip/femoral neck fracture  Planned Discharge Destination: Skilled nursing facility,  pending insurance auth       Time spent: 35 minutes  Author: Pennie Banter, DO 06/09/2023 12:50 PM  For on call review [email or web address].

## 2023-06-09 NOTE — Progress Notes (Signed)
Subjective:  POD #7 s/p left hip hemiarthroplasty.   Patient reports left hip pain as mild.  Patient's brother is visiting in the room.  Patient had difficult night from being on and off bedpan due to bowel regimen.  Pain the patient is feeling better.  She is awaiting clearance to go to [facility].  Objective:   VITALS:   Vitals:   06/08/23 1533 06/08/23 2105 06/08/23 2121 06/09/23 0839  BP: (!) 147/65 113/73 138/88 (!) 141/64  Pulse: 78 86 91 (!) 104  Resp: 16  18 16   Temp: 98.7 F (37.1 C)  98.9 F (37.2 C) 97.9 F (36.6 C)  TempSrc: Oral  Oral   SpO2: 95% 99% 93% 91%  Weight:      Height:        PHYSICAL EXAM: Left lower extremity She is wearing her SCDs.Thigh is soft and proximal.  Compartments are soft.  Distally patient is neurovascular intact.   Neurovascular intact Sensation intact distally Intact pulses distally Dorsiflexion/Plantar flexion intact Incision: moderate drainage No cellulitis present Compartment soft  LABS  Results for orders placed or performed during the hospital encounter of 05/31/23 (from the past 24 hour(s))  Glucose, capillary     Status: Abnormal   Collection Time: 06/08/23  5:17 PM  Result Value Ref Range   Glucose-Capillary 53 (L) 70 - 99 mg/dL  Glucose, capillary     Status: None   Collection Time: 06/08/23  6:16 PM  Result Value Ref Range   Glucose-Capillary 78 70 - 99 mg/dL  Glucose, capillary     Status: Abnormal   Collection Time: 06/08/23 10:36 PM  Result Value Ref Range   Glucose-Capillary 126 (H) 70 - 99 mg/dL  Basic metabolic panel     Status: Abnormal   Collection Time: 06/09/23  4:05 AM  Result Value Ref Range   Sodium 130 (L) 135 - 145 mmol/L   Potassium 4.1 3.5 - 5.1 mmol/L   Chloride 92 (L) 98 - 111 mmol/L   CO2 29 22 - 32 mmol/L   Glucose, Bld 118 (H) 70 - 99 mg/dL   BUN 26 (H) 8 - 23 mg/dL   Creatinine, Ser 7.25 0.44 - 1.00 mg/dL   Calcium 8.2 (L) 8.9 - 10.3 mg/dL   GFR, Estimated >36 >64 mL/min   Anion  gap 9 5 - 15  Glucose, capillary     Status: Abnormal   Collection Time: 06/09/23  8:40 AM  Result Value Ref Range   Glucose-Capillary 134 (H) 70 - 99 mg/dL  Urinalysis, Complete w Microscopic -Urine, Clean Catch     Status: Abnormal   Collection Time: 06/09/23  8:50 AM  Result Value Ref Range   Color, Urine YELLOW (A) YELLOW   APPearance CLOUDY (A) CLEAR   Specific Gravity, Urine 1.015 1.005 - 1.030   pH 6.0 5.0 - 8.0   Glucose, UA >=500 (A) NEGATIVE mg/dL   Hgb urine dipstick LARGE (A) NEGATIVE   Bilirubin Urine NEGATIVE NEGATIVE   Ketones, ur 5 (A) NEGATIVE mg/dL   Protein, ur 403 (A) NEGATIVE mg/dL   Nitrite POSITIVE (A) NEGATIVE   Leukocytes,Ua LARGE (A) NEGATIVE   RBC / HPF >50 0 - 5 RBC/hpf   WBC, UA >50 0 - 5 WBC/hpf   Bacteria, UA MANY (A) NONE SEEN   Squamous Epithelial / HPF 6-10 0 - 5 /HPF   WBC Clumps PRESENT   Urinalysis, w/ Reflex to Culture (Infection Suspected) -Urine, Clean Catch  Status: Abnormal   Collection Time: 06/09/23  8:50 AM  Result Value Ref Range   Specimen Source URINE, RANDOM    Color, Urine YELLOW (A) YELLOW   APPearance CLOUDY (A) CLEAR   Specific Gravity, Urine 1.016 1.005 - 1.030   pH 6.0 5.0 - 8.0   Glucose, UA >=500 (A) NEGATIVE mg/dL   Hgb urine dipstick LARGE (A) NEGATIVE   Bilirubin Urine NEGATIVE NEGATIVE   Ketones, ur 5 (A) NEGATIVE mg/dL   Protein, ur 147 (A) NEGATIVE mg/dL   Nitrite NEGATIVE NEGATIVE   Leukocytes,Ua LARGE (A) NEGATIVE   Squamous Epithelial / HPF 0-5 0 - 5 /HPF   WBC, UA >50 0 - 5 WBC/hpf   RBC / HPF >50 0 - 5 RBC/hpf   Bacteria, UA MANY (A) NONE SEEN  Glucose, capillary     Status: Abnormal   Collection Time: 06/09/23 11:49 AM  Result Value Ref Range   Glucose-Capillary 103 (H) 70 - 99 mg/dL    No results found.  Assessment/Plan: 7 Days Post-Op   Principal Problem:   Closed left hip fracture (HCC) Active Problems:   Anxiety and depression   Hypertensive urgency   History of CVA with residual  deficit   Hyponatremia   S/P CABG x 4   Uncontrolled type 2 diabetes mellitus with hyperglycemia, without long-term current use of insulin (HCC)   Dyslipidemia   Osteoporosis   Hypoglycemia   UTI (urinary tract infection)  Patient will have her left Aquacel dressing changed due to sanguinous drainage.  Patient progressing from orthopedic standpoint.  She may be discharged to SNF once cleared medically. Continue physical therapy. She is weightbearing as tolerated on the left lower extremity with posterior hip precautions. Continue Lovenox for DVT prophylaxis. Continue diabetes management as ordered by primary team. She may be discharged to rehab once cleared medically. She will follow-up at [facility] in [place] in 10 to 14 days and continue Lovenox 40 mg daily or enteric-coated aspirin 325 mg p.o. twice daily until follow-up in the office.    Juanell Fairly , MD 06/09/2023, 1:40 PM

## 2023-06-09 NOTE — Progress Notes (Signed)
Physical Therapy Treatment Patient Details Name: [name] MRN: [id number] DOB: Apr 13, 1956 Today's Date: 06/09/2023   History of Present Illness Pt is a 67 y.o. female with PMH consisting of HTN, CAD, CABG, CVA (L sided symptoms), type 2 DM. Pt admitted to ED (6/17) with left hip pain following a mechanical fall at work. MD assessment includes: closed left femoral neck fx. Pt is now s/p left hip hemiarthroplasty (6/19).    PT Comments    Pt lying in bed upon arrival. Pt reported still feeling tired from laxatives last night but agreeable to doing some more supine exercises as well as some exercises sitting EOB. No adverse responses to supine/sitting therex however pt continued to need frequent, long rest breaks in between sets for pain control. Vitals remained WNL throughout session. Pt urged to try STS/ambulation but refused. Pt then educated on importance and efficacy of getting up and walking vs. bed therex. Pt agreeable to trying to stand/walk tomorrow. Pt will benefit from continued PT services upon discharge to safely address deficits listed in patient problem list for decreased caregiver assistance and eventual return to PLOF.   Recommendations for follow up therapy are one component of a multi-disciplinary discharge planning process, led by the attending physician.  Recommendations may be updated based on patient status, additional functional criteria and insurance authorization.  Follow Up Recommendations  Can patient physically be transported by private vehicle: No    Assistance Recommended at Discharge Intermittent Supervision/Assistance  Patient can return home with the following Help with stairs or ramp for entrance;Assist for transportation;Assistance with cooking/housework;A lot of help with walking and/or transfers;A lot of help with bathing/dressing/bathroom   Equipment Recommendations       Recommendations for Other Services       Precautions / Restrictions  Precautions Precautions: Posterior Hip;Fall Restrictions Weight Bearing Restrictions: Yes LLE Weight Bearing: Weight bearing as tolerated     Mobility  Bed Mobility Overal bed mobility: Needs Assistance Bed Mobility: Supine to Sit, Sit to Supine     Supine to sit: Min assist Sit to supine: Mod assist   General bed mobility comments: Min A supine > sit for LLE management, Mod A sit > supine for bilateral LE management    Transfers                   General transfer comment: NT, pt refused transfers/ambulation secondary to still feeling weak from laxatives last night    Ambulation/Gait                   Stairs             Wheelchair Mobility    Modified Rankin (Stroke Patients Only)       Balance Overall balance assessment: Needs assistance Sitting-balance support: Single extremity supported, Feet supported Sitting balance-Leahy Scale: Fair                                      Cognition Arousal/Alertness: Awake/alert Behavior During Therapy: WFL for tasks assessed/performed Overall Cognitive Status: Within Functional Limits for tasks assessed                                          Exercises Total Joint Exercises Ankle Circles/Pumps: AROM, Both, 1 x 10 reps, Supine (With manual resistance)  Quad Sets: Strengthening, Left, 1 x 10 reps, Supine Gluteal Sets: Strengthening, Both, 1 x 10 reps, Supine Heel Slides: AROM, Left, 1 x 10 reps, Supine (With manual resistance) Long Arc Quad: AROM, Strengthening, Both, 2 x 10 reps (With manual resistance) Knee Flexion: AROM, Strengthening, Both, 2 x 10 reps (With manual resistance) Other Exercises Other Exercises: Posterior hip precaution education    General Comments General comments (skin integrity, edema, etc.): Pt able to remember 3/3 posterior hip precautions.      Pertinent Vitals/Pain Pain Assessment Pain Assessment: 0-10 Pain Score: 4  Pain Location:  left hip Pain Intervention(s): Monitored during session, Limited activity within patient's tolerance, RN gave pain meds during session, Repositioned    Home Living                          Prior Function            PT Goals (current goals can now be found in the care plan section) Progress towards PT goals: Progressing toward goals    Frequency    BID      PT Plan Current plan remains appropriate    Co-evaluation              AM-PAC PT "6 Clicks" Mobility   Outcome Measure  Help needed turning from your back to your side while in a flat bed without using bedrails?: A Lot Help needed moving from lying on your back to sitting on the side of a flat bed without using bedrails?: A Lot Help needed moving to and from a bed to a chair (including a wheelchair)?: A Lot Help needed standing up from a chair using your arms (e.g., wheelchair or bedside chair)?: A Little Help needed to walk in hospital room?: A Lot Help needed climbing 3-5 steps with a railing? : Total 6 Click Score: 12    End of Session   Activity Tolerance: Patient limited by fatigue Patient left: in bed;with SCD's reapplied;with call bell/phone within reach;with bed alarm set;with nursing/sitter in room;with family/visitor present Nurse Communication: Mobility status;Weight bearing status PT Visit Diagnosis: Other abnormalities of gait and mobility (R26.89);Muscle weakness (generalized) (M62.81);Pain;Unsteadiness on feet (R26.81) Pain - Right/Left: Left Pain - part of body: Hip     Time: 4098-1191 PT Time Calculation (min) (ACUTE ONLY): 46 min  Charges:                        Cena Benton, SPT 06/09/23, 5:04 PM

## 2023-06-09 NOTE — TOC Progression Note (Signed)
Transition of Care [facility]) - Progression Note    Patient Details  Name: [name] MRN: [id number] Date of Birth: August 08, 1956  Transition of Care [facility]) CM/SW Contact  Garret Reddish, RN Phone Number: 06/09/2023, 4:45 PM  Clinical Narrative:    Patient has been approved for SNF from [employer].  SNF approval number is [id number]. Ambulance approval number is [phone]. I have informed [facility] of this information.  [facility].    I have informed patient of the above information.     Expected Discharge Plan: Skilled Nursing Facility Barriers to Discharge: SNF Pending bed offer, Insurance Authorization  Expected Discharge Plan and Services   Discharge Planning Services: CM Consult   Living arrangements for the past 2 months: Single Family Home                                       Social Determinants of Health (SDOH) Interventions SDOH Screenings   Food Insecurity: No Food Insecurity (06/01/2023)  Housing: Low Risk  (06/01/2023)  Transportation Needs: No Transportation Needs (06/01/2023)  Utilities: Not At Risk (06/01/2023)  Alcohol Screen: Low Risk  (04/28/2023)  Depression (PHQ2-9): Low Risk  (04/28/2023)  Recent Concern: Depression (PHQ2-9) - High Risk (02/10/2023)  Financial Resource Strain: Low Risk  (04/28/2023)  Physical Activity: Inactive (04/28/2023)  Social Connections: Socially Isolated (04/28/2023)  Stress: Stress Concern Present (04/28/2023)  Tobacco Use: Medium Risk (06/09/2023)    Readmission Risk Interventions    08/11/2021    1:30 PM  Readmission Risk Prevention Plan  Transportation Screening Complete  HRI or Home Care Consult Complete  Social Work Consult for Recovery Care Planning/Counseling Complete  Palliative Care Screening Not Applicable  Medication Review [employer]) Complete

## 2023-06-09 NOTE — Progress Notes (Signed)
Physical Therapy Treatment Patient Details Name: [name] MRN: [id number] DOB: [birth year] Today's Date: 06/09/2023   History of Present Illness Pt is a 67 y.o. female with PMH consisting of HTN, CAD, CABG, CVA (L sided symptoms), type 2 DM. Pt admitted to ED (6/17) with left hip pain following a mechanical fall at work. MD assessment includes: closed left femoral neck fx. Pt is now s/p left hip hemiarthroplasty (6/19).    PT Comments    Pt lying in bed upon arrival. Pt refused mobility secondary to feeling weak/fatigued from taking laxatives last night. Pt agreeable to performing some supine therex and put forth good effort throughout. She needed frequent therapeutic rest breaks after each set due to increased pain in her LLE. Vitals remained WNL throughout session. Pt will benefit from continued PT services upon discharge to safely address deficits listed in patient problem list for decreased caregiver assistance and eventual return to PLOF.   Recommendations for follow up therapy are one component of a multi-disciplinary discharge planning process, led by the attending physician.  Recommendations may be updated based on patient status, additional functional criteria and insurance authorization.  Follow Up Recommendations  Can patient physically be transported by private vehicle: No    Assistance Recommended at Discharge Intermittent Supervision/Assistance  Patient can return home with the following Help with stairs or ramp for entrance;Assist for transportation;Assistance with cooking/housework;A lot of help with walking and/or transfers;A lot of help with bathing/dressing/bathroom   Equipment Recommendations  BSC/3in1    Recommendations for Other Services       Precautions / Restrictions Precautions Precautions: Posterior Hip;Fall Restrictions Weight Bearing Restrictions: Yes LLE Weight Bearing: Weight bearing as tolerated     Mobility  Bed Mobility                General bed mobility comments: NT, pt declined mobility secondary feeling weak from taking laxatives the night before.    Transfers                        Ambulation/Gait                   Stairs             Wheelchair Mobility    Modified Rankin (Stroke Patients Only)       Balance                                            Cognition Arousal/Alertness: Awake/alert Behavior During Therapy: WFL for tasks assessed/performed Overall Cognitive Status: Within Functional Limits for tasks assessed                                          Exercises Total Joint Exercises Ankle Circles/Pumps: AROM, Both, 10 reps, Supine (With manual resistance) Quad Sets: Strengthening, Left, 2 x 10 reps, Supine Gluteal Sets: Strengthening, Both, 2 x 10 reps, Supine Short Arc Quad: AROM, Left, 2 x 10 reps, Supine, Strengthening Heel Slides: AROM, Left, 1 x 10 reps, Supine (With manual resistance) Hip ABduction/ADduction: AROM, Right, 10 reps, Supine Other Exercises Other Exercises: Education on posterior hip precautions    General Comments General comments (skin integrity, edema, etc.): Pt more fatigued, less energy today. She reports taking laxatives last  night which "zapped" her strength this morning.      Pertinent Vitals/Pain Pain Assessment Pain Assessment: 0-10 Pain Score: 7  Pain Location: left hip - with mobility Pain Intervention(s): Limited activity within patient's tolerance, Monitored during session, Premedicated before session    Home Living                          Prior Function            PT Goals (current goals can now be found in the care plan section) Progress towards PT goals: Progressing toward goals    Frequency    BID      PT Plan Current plan remains appropriate    Co-evaluation              AM-PAC PT "6 Clicks" Mobility   Outcome Measure  Help needed turning from your  back to your side while in a flat bed without using bedrails?: A Lot Help needed moving from lying on your back to sitting on the side of a flat bed without using bedrails?: A Lot   Help needed standing up from a chair using your arms (e.g., wheelchair or bedside chair)?: A Little Help needed to walk in hospital room?: A Lot Help needed climbing 3-5 steps with a railing? : Total 6 Click Score: 10    End of Session   Activity Tolerance: Patient limited by fatigue Patient left: in bed;with SCD's reapplied;with call bell/phone within reach;with bed alarm set Nurse Communication: Mobility status;Weight bearing status PT Visit Diagnosis: Other abnormalities of gait and mobility (R26.89);Muscle weakness (generalized) (M62.81);Pain;Unsteadiness on feet (R26.81) Pain - Right/Left: Left Pain - part of body: Hip     Time: 1324-4010 PT Time Calculation (min) (ACUTE ONLY): 29 min  Charges:                        Cena Benton, SPT 06/09/23, 2:41 PM

## 2023-06-10 DIAGNOSIS — F419 Anxiety disorder, unspecified: Secondary | ICD-10-CM | POA: Diagnosis not present

## 2023-06-10 DIAGNOSIS — E871 Hypo-osmolality and hyponatremia: Secondary | ICD-10-CM | POA: Diagnosis not present

## 2023-06-10 DIAGNOSIS — E1165 Type 2 diabetes mellitus with hyperglycemia: Secondary | ICD-10-CM | POA: Diagnosis not present

## 2023-06-10 DIAGNOSIS — N3001 Acute cystitis with hematuria: Secondary | ICD-10-CM | POA: Diagnosis not present

## 2023-06-10 LAB — BASIC METABOLIC PANEL
Anion gap: 9 (ref 5–15)
BUN: 19 mg/dL (ref 8–23)
CO2: 26 mmol/L (ref 22–32)
Calcium: 7.9 mg/dL — ABNORMAL LOW (ref 8.9–10.3)
Chloride: 96 mmol/L — ABNORMAL LOW (ref 98–111)
Creatinine, Ser: 0.82 mg/dL (ref 0.44–1.00)
GFR, Estimated: >60 mL/min (ref >60)
Glucose, Bld: 123 mg/dL — ABNORMAL HIGH (ref 70–99)
Potassium: 4.1 mmol/L (ref 3.5–5.1)
Sodium: 131 mmol/L — ABNORMAL LOW (ref 135–145)

## 2023-06-10 LAB — URINE CULTURE

## 2023-06-10 LAB — GLUCOSE, CAPILLARY
Glucose-Capillary: 121 mg/dL — ABNORMAL HIGH (ref 70–99)
Glucose-Capillary: 164 mg/dL — ABNORMAL HIGH (ref 70–99)
Glucose-Capillary: 158 mg/dL — ABNORMAL HIGH (ref 70–99)
Glucose-Capillary: 158 mg/dL — ABNORMAL HIGH (ref 70–99)

## 2023-06-10 NOTE — Progress Notes (Signed)
  Subjective:  POD #8 s/p left hip hemiarthroplasty.   Patient states she had a good day today made good progress with physical therapy.  She states that she was able to walk to use the bedside commode and sit in a chair today out pain.  Dressing over the left hip was changed yesterday by the nursing staff.  Objective:   VITALS:   Vitals:   06/09/23 1454 06/09/23 2127 06/10/23 0746 06/10/23 1532  BP: 133/83 (!) 125/56 (!) 133/59 134/76  Pulse: 75 77 66 63  Resp: 15 20 16 17   Temp: 98.7 F (37.1 C) 99.5 F (37.5 C) 98.3 F (36.8 C) 98.5 F (36.9 C)  TempSrc: Oral     SpO2: 96% 93% 93% 93%  Weight:      Height:        PHYSICAL EXAM: Right lower extremity Neurovascular intact Sensation intact distally Intact pulses distally Dorsiflexion/Plantar flexion intact Aquacel dressing C/D/I No cellulitis present Compartment soft  LABS  Results for orders placed or performed during the hospital encounter of 05/31/23 (from the past 24 hour(s))  Glucose, capillary     Status: Abnormal   Collection Time: 06/09/23  5:45 PM  Result Value Ref Range   Glucose-Capillary 111 (H) 70 - 99 mg/dL  Glucose, capillary     Status: Abnormal   Collection Time: 06/09/23  9:23 PM  Result Value Ref Range   Glucose-Capillary 117 (H) 70 - 99 mg/dL   Comment 1 Notify RN   Basic metabolic panel     Status: Abnormal   Collection Time: 06/10/23  5:44 AM  Result Value Ref Range   Sodium 131 (L) 135 - 145 mmol/L   Potassium 4.1 3.5 - 5.1 mmol/L   Chloride 96 (L) 98 - 111 mmol/L   CO2 26 22 - 32 mmol/L   Glucose, Bld 123 (H) 70 - 99 mg/dL   BUN 19 8 - 23 mg/dL   Creatinine, Ser 3.66 0.44 - 1.00 mg/dL   Calcium 7.9 (L) 8.9 - 10.3 mg/dL   GFR, Estimated >44 >03 mL/min   Anion gap 9 5 - 15  Glucose, capillary     Status: Abnormal   Collection Time: 06/10/23  8:07 AM  Result Value Ref Range   Glucose-Capillary 121 (H) 70 - 99 mg/dL  Glucose, capillary     Status: Abnormal   Collection Time: 06/10/23  11:54 AM  Result Value Ref Range   Glucose-Capillary 164 (H) 70 - 99 mg/dL    No results found.  Assessment/Plan: 8 Days Post-Op   Principal Problem:   Closed left hip fracture (HCC) Active Problems:   Anxiety and depression   Hypertensive urgency   History of CVA with residual deficit   Hyponatremia   S/P CABG x 4   Uncontrolled type 2 diabetes mellitus with hyperglycemia, without long-term current use of insulin (HCC)   Dyslipidemia   Osteoporosis   Hypoglycemia   UTI (urinary tract infection)  Patient is making good progress with physical therapy.  She is doing well from orthopedic standpoint.  Dr. Denton Lank states the patient will be discharged to rehab tomorrow.  She is currently being treated for a UTI.  The patient will continue Lovenox for DVT prophylaxis until follow-up at [facility] in [place] in 10 to 14 days.  She is weightbearing as tolerated on left lower extremity with posterior hip precautions.    [name] , MD 06/10/2023, 3:43 PM

## 2023-06-10 NOTE — Progress Notes (Signed)
Progress Note   Patient: [name] ZOX:[id number] DOB: [birth year] DOA: 05/31/2023     10 DOS: the patient was seen and examined on 06/10/2023   Brief hospital course: 67 y.o. female with medical history significant for disease status post four-vessel CABG, type 2 diabetes mellitus, hypertension, CVA, dyslipidemia, GERD, depression and anxiety admitted for left femoral neck fracture status post fall  6/18: Ortho consult, plan for surgery tomorrow  Assessment and Plan: Closed left hip fracture (HCC) - Left hip hemiarthroplasty 6/19, successful - hgb stable in the 10s post-op - Pain management, bowel regimen, (had bm last night) - pt/ot advising snf, toc working on that, [facility], pending insurance auth - lovenox for dvt ppx - outpt pcp f/u to address osteoarthritis  UTI - unknown if POA. UA obtained in cross coverage, early AM 6/26 -- large leukocytes, positive nitrite, many bacteria, > 50 wbc's Urine culture +GNR's, growing Klebsiella pneumoniae -  --Continue Rocephin --Follow up urine culture susceptibilities  Uncontrolled type 2 diabetes mellitus with hyperglycemia, without long-term current use of insulin (HCC) Hemoglobin A1c 11 suggestive of poor blood sugar control - hypoglycemic again before dinner yesterday (previously decreased semglee and mealtime insulin) - hold off metformin and glyburide - hold jardiance given dehydration  Hyponatremia Baseline 135, with low sodium and elevated urine osm and up-trending cr and bun appears dehydration playing some role. Tsh and am cortisol wnl - push fluids, monitor  Hypertension wnl - home bp meds on hold.  Dyslipidemia - continue statin therapy   Hx CVA - cont aspirin, rosuvastatin  Anxiety and depression - continue Xanax and Zoloft.  As needed trazodone for sleep        Subjective: Pt states "something isn't right", expresses frustration at being slow to start improving with her mobility compared to her  first hip replacement years ago.  PT in room to begin working with patient.  She reports the hip continues to hurt, pain medications make her sleepy.  No other acute complaints.  Denies dysuria or fever/chills.   Physical Exam: Vitals:   06/09/23 0839 06/09/23 1454 06/09/23 2127 06/10/23 0746  BP: (!) 141/64 133/83 (!) 125/56 (!) 133/59  Pulse: (!) 104 75 77 66  Resp: 16 15 20 16   Temp: 97.9 F (36.6 C) 98.7 F (37.1 C) 99.5 F (37.5 C) 98.3 F (36.8 C)  TempSrc:  Oral    SpO2: 91% 96% 93% 93%  Weight:      Height:       General exam: awake, alert, no acute distress HEENT: moist mucus membranes, hearing grossly normal  Respiratory system: CTAB, no wheezes, rales or rhonchi, normal respiratory effort. Cardiovascular system: normal S1/S2, RRR, no pedal edema.   Gastrointestinal system: soft, NT, ND. Central nervous system: A&O x 3. no gross focal neurologic deficits, normal speech Extremities: no edema, normal tone Skin: dry, intact, normal temperature Psychiatry: anxious mood, congruent affect, judgement and insight appear normal    Data Reviewed: Notable labs --- Sodium 129 >> 130 >> 131, Cl 92 >> 96, Glucose 123,   UA 6/26 -- large Hbg, large leukocytes, positive nitrites, many bacteria, > 50 WBC's, >50 rbc's  Micro - Urine culture 6/26 -- gram stain GNR's   Family Communication: None at bedside  Disposition: Status is: Inpatient Remains inpatient appropriate because: Management of left hip/femoral neck fracture  Planned Discharge Destination: Skilled nursing facility,  pending insurance auth       Time spent: 35 minutes  Author: Alphonsus Sias  Denton Lank, DO 06/10/2023 1:27 PM  For on call review [email or web address].

## 2023-06-10 NOTE — TOC Progression Note (Signed)
Transition of Care [facility]) - Progression Note    Patient Details  Name: [name] MRN: [id number] Date of Birth: 1956-02-11  Transition of Care [facility]) CM/SW Contact  Garret Reddish, RN Phone Number: 06/10/2023, 4:20 PM  Clinical Narrative:   Chart reviewed.  Noted that patient has UTI and started on IV Rocephin.  Awaiting urine culture.    Discharge cancelled for today and will be a tentative discharge for tommrow.  I have informed Gina with [facility].   TOC will continue to follow for discharge planning.      Expected Discharge Plan: Skilled Nursing Facility Barriers to Discharge: SNF Pending bed offer, Insurance Authorization  Expected Discharge Plan and Services   Discharge Planning Services: CM Consult   Living arrangements for the past 2 months: Single Family Home                                       Social Determinants of Health (SDOH) Interventions SDOH Screenings   Food Insecurity: No Food Insecurity (06/01/2023)  Housing: Low Risk  (06/01/2023)  Transportation Needs: No Transportation Needs (06/01/2023)  Utilities: Not At Risk (06/01/2023)  Alcohol Screen: Low Risk  (04/28/2023)  Depression (PHQ2-9): Low Risk  (04/28/2023)  Recent Concern: Depression (PHQ2-9) - High Risk (02/10/2023)  Financial Resource Strain: Low Risk  (04/28/2023)  Physical Activity: Inactive (04/28/2023)  Social Connections: Socially Isolated (04/28/2023)  Stress: Stress Concern Present (04/28/2023)  Tobacco Use: Medium Risk (06/09/2023)    Readmission Risk Interventions    08/11/2021    1:30 PM  Readmission Risk Prevention Plan  Transportation Screening Complete  HRI or Home Care Consult Complete  Social Work Consult for Recovery Care Planning/Counseling Complete  Palliative Care Screening Not Applicable  Medication Review [employer]) Complete

## 2023-06-10 NOTE — Progress Notes (Signed)
Physical Therapy Treatment Patient Details Name: [name] MRN: [id number] DOB: May 26, 1956 Today's Date: 06/10/2023   History of Present Illness Pt is a 67 y.o. female with PMH consisting of HTN, CAD, CABG, CVA (L sided symptoms), type 2 DM. Pt admitted to ED (6/17) with left hip pain following a mechanical fall at work. MD assessment includes: closed left femoral neck fx. Pt is now s/p left hip hemiarthroplasty (6/19).    PT Comments    Afternoon session with hopes to progress gait distance limited due to c/o dizziness after utilizing BSC. Will progress in am after pt pre-medicated. Despite limited gait progression, pt continues to improve with functional transfers with less c/o pain and increased tolerance for active ROM of L LE. Will continue to focus on progressing mobility tomorrow. Awaiting transition to short term rehab prior to returning home.   Recommendations for follow up therapy are one component of a multi-disciplinary discharge planning process, led by the attending physician.  Recommendations may be updated based on patient status, additional functional criteria and insurance authorization.  Follow Up Recommendations  Can patient physically be transported by private vehicle: No    Assistance Recommended at Discharge Intermittent Supervision/Assistance  Patient can return home with the following Help with stairs or ramp for entrance;Assist for transportation;Assistance with cooking/housework;A little help with walking and/or transfers;A little help with bathing/dressing/bathroom   Equipment Recommendations  BSC/3in1    Recommendations for Other Services       Precautions / Restrictions Precautions Precautions: Posterior Hip;Fall Precaution Booklet Issued: Yes (comment) Precaution Comments: Pt able to recall precautions Required Braces or Orthoses: Other Brace Other Brace: Hip abduction brace Restrictions Weight Bearing Restrictions: Yes LLE Weight Bearing:  Weight bearing as tolerated Other Position/Activity Restrictions: Pt has been anxious due to fear of increased pain with mobility     Mobility  Bed Mobility Overal bed mobility: Needs Assistance Bed Mobility: Sit to Supine     Supine to sit: Min assist, HOB elevated Sit to supine: Mod assist, Min assist (for B LE's)   General bed mobility comments:  (Assisted with LE's to keep anxiety and pain minimal)    Transfers Overall transfer level: Needs assistance Equipment used: Rolling walker (2 wheels) Transfers: Sit to/from Stand, Bed to chair/wheelchair/BSC Sit to Stand: Mod assist   Step pivot transfers: Min assist       General transfer comment:  (Progressing with cues for hand placement and precautions)    Ambulation/Gait Ambulation/Gait assistance: Min guard Gait Distance (Feet): 5 Feet Assistive device: Rolling walker (2 wheels) Gait Pattern/deviations: Step-to pattern, Antalgic, Decreased stance time - left, Decreased weight shift to left, Shuffle Gait velocity: decreased     General Gait Details:  (Pt limited gait distance this pm despite plan to progress, due to c/o lightheadedness.)   Stairs             Wheelchair Mobility    Modified Rankin (Stroke Patients Only)       Balance Overall balance assessment: Needs assistance Sitting-balance support: Single extremity supported, Feet supported Sitting balance-Leahy Scale: Fair     Standing balance support: Bilateral upper extremity supported, During functional activity, Reliant on assistive device for balance Standing balance-Leahy Scale: Fair Standing balance comment:  (No LOB with short distance gait in room with RW)                            Cognition Arousal/Alertness: Awake/alert Behavior During Therapy: [facility] for tasks  assessed/performed Overall Cognitive Status: Within Functional Limits for tasks assessed                                 General Comments:   (Encouragement and rest breaks needed as confidence improves)        Exercises Total Joint Exercises Ankle Circles/Pumps: AROM, Both, 10 reps, Supine Hip ABduction/ADduction: AROM, Right, 10 reps, Supine Long Arc Quad: AROM, Strengthening, Both, 10 reps    General Comments General comments (skin integrity, edema, etc.): Continued education provided on POC, role of PT, and current goals      Pertinent Vitals/Pain Pain Assessment Pain Assessment: 0-10 Pain Score: 5  Pain Location: left hip Pain Descriptors / Indicators: Aching, Burning, Discomfort Pain Intervention(s): Repositioned, Patient requesting pain meds-RN notified, Ice applied    Home Living                          Prior Function            PT Goals (current goals can now be found in the care plan section) Acute Rehab PT Goals Patient Stated Goal: Walk better to go back to work Progress towards PT goals: Progressing toward goals    Frequency    BID      PT Plan Current plan remains appropriate    Co-evaluation              AM-PAC PT "6 Clicks" Mobility   Outcome Measure  Help needed turning from your back to your side while in a flat bed without using bedrails?: A Lot Help needed moving from lying on your back to sitting on the side of a flat bed without using bedrails?: A Lot Help needed moving to and from a bed to a chair (including a wheelchair)?: A Little Help needed standing up from a chair using your arms (e.g., wheelchair or bedside chair)?: A Little Help needed to walk in hospital room?: A Little Help needed climbing 3-5 steps with a railing? : A Lot 6 Click Score: 15    End of Session Equipment Utilized During Treatment: Gait belt Activity Tolerance: Patient limited by pain (and dizziness) Patient left: in bed;with call bell/phone within reach;with bed alarm set Nurse Communication: Mobility status;Patient requests pain meds PT Visit Diagnosis: Other abnormalities of gait  and mobility (R26.89);Muscle weakness (generalized) (M62.81);Pain;Unsteadiness on feet (R26.81) Pain - Right/Left: Left Pain - part of body: Hip     Time: 1330-1411 PT Time Calculation (min) (ACUTE ONLY): 41 min  Charges:  $Gait Training: 8-22 mins $Therapeutic Exercise: 8-22 mins $Therapeutic Activity: 8-22 mins                    Zadie Cleverly, PTA  Jannet Askew 06/10/2023, 3:46 PM

## 2023-06-10 NOTE — Plan of Care (Signed)
  Problem: Education: Goal: Ability to describe self-care measures that may prevent or decrease complications (Diabetes Survival Skills Education) will improve Outcome: Progressing   Problem: Coping: Goal: Ability to adjust to condition or change in health will improve Outcome: Progressing   Problem: Education: Goal: Knowledge of General Education information will improve Description: Including pain rating scale, medication(s)/side effects and non-pharmacologic comfort measures Outcome: Progressing   Problem: Health Behavior/Discharge Planning: Goal: Ability to manage health-related needs will improve Outcome: Progressing   

## 2023-06-10 NOTE — Progress Notes (Signed)
Physical Therapy Treatment Patient Details Name: [name] MRN: [id number] DOB: September 30, 1956 Today's Date: 06/10/2023   History of Present Illness Pt is a 67 y.o. female with PMH consisting of HTN, CAD, CABG, CVA (L sided symptoms), type 2 DM. Pt admitted to ED (6/17) with left hip pain following a mechanical fall at work. MD assessment includes: closed left femoral neck fx. Pt is now s/p left hip hemiarthroplasty (6/19).    PT Comments    Pt received in bed earlier today after receiving 10mg  of Oxy ~ 2 hours prior. Pt agreed to session. Education provided at length regarding importance of progression, hip precautions, pain level, and working through her anxiety. Overall, pt made excellent progress requiring MinA for bed mobility with HOB raised, transfers with ModA from various surfaces, and gait training in room with RW ~5' with CGA. Pt positioned in chair post session. Plan to increase gait distance in pm.   Recommendations for follow up therapy are one component of a multi-disciplinary discharge planning process, led by the attending physician.  Recommendations may be updated based on patient status, additional functional criteria and insurance authorization.  Follow Up Recommendations  Can patient physically be transported by private vehicle: No    Assistance Recommended at Discharge Intermittent Supervision/Assistance  Patient can return home with the following Help with stairs or ramp for entrance;Assist for transportation;Assistance with cooking/housework;A little help with walking and/or transfers;A little help with bathing/dressing/bathroom   Equipment Recommendations  BSC/3in1    Recommendations for Other Services       Precautions / Restrictions Precautions Precautions: Posterior Hip;Fall Precaution Booklet Issued: Yes (comment) Precaution Comments: Pt able to recall precautions Other Brace: Hip abduction brace Restrictions Weight Bearing Restrictions: Yes LLE  Weight Bearing: Weight bearing as tolerated Other Position/Activity Restrictions: Pt has been anxious due to fear of increased pain with mobility     Mobility  Bed Mobility Overal bed mobility: Needs Assistance Bed Mobility: Supine to Sit     Supine to sit: Min assist, HOB elevated          Transfers Overall transfer level: Needs assistance Equipment used: Rolling walker (2 wheels) Transfers: Sit to/from Stand, Bed to chair/wheelchair/BSC Sit to Stand: Mod assist   Step pivot transfers: Min assist       General transfer comment:  (Improved transfers this date, able to transfer to [facility] and bed<>chair)    Ambulation/Gait Ambulation/Gait assistance: Min guard Gait Distance (Feet): 5 Feet Assistive device: Rolling walker (2 wheels) Gait Pattern/deviations: Step-to pattern, Antalgic, Decreased stance time - left, Decreased weight shift to left, Shuffle Gait velocity: decreased     General Gait Details:  (Gait training in room after use of BSC, no significant c/o pain or increased anxiety)   Stairs             Wheelchair Mobility    Modified Rankin (Stroke Patients Only)       Balance Overall balance assessment: Needs assistance Sitting-balance support: Single extremity supported, Feet supported Sitting balance-Leahy Scale: Fair     Standing balance support: Bilateral upper extremity supported, During functional activity, Reliant on assistive device for balance Standing balance-Leahy Scale: Fair Standing balance comment:  (No LOB with gait training in room)                            Cognition Arousal/Alertness: Awake/alert Behavior During Therapy: WFL for tasks assessed/performed Overall Cognitive Status: Within Functional Limits for tasks assessed  General Comments:  (Encouragement and rest breaks needed as confidence improves)        Exercises Total Joint Exercises Ankle Circles/Pumps:  AROM, Both, 10 reps, Supine Hip ABduction/ADduction: AROM, Right, 10 reps, Supine Long Arc Quad: AROM, Strengthening, Both, 10 reps    General Comments General comments (skin integrity, edema, etc.):  (Education provided regarding hip precautions and general progression post surgery. Pt able to progress with cues for decreased anxiety.)      Pertinent Vitals/Pain Pain Assessment Pain Assessment: 0-10 Pain Score: 2  Pain Location: left hip Pain Descriptors / Indicators: Aching, Burning, Discomfort Pain Intervention(s): Premedicated before session, Ice applied    Home Living                          Prior Function            PT Goals (current goals can now be found in the care plan section) Acute Rehab PT Goals Patient Stated Goal: Walk better to go back to work Progress towards PT goals: Progressing toward goals    Frequency    BID      PT Plan Current plan remains appropriate    Co-evaluation              AM-PAC PT "6 Clicks" Mobility   Outcome Measure  Help needed turning from your back to your side while in a flat bed without using bedrails?: A Lot Help needed moving from lying on your back to sitting on the side of a flat bed without using bedrails?: A Lot Help needed moving to and from a bed to a chair (including a wheelchair)?: A Little Help needed standing up from a chair using your arms (e.g., wheelchair or bedside chair)?: A Little Help needed to walk in hospital room?: A Little Help needed climbing 3-5 steps with a railing? : A Lot 6 Click Score: 15    End of Session Equipment Utilized During Treatment: Gait belt Activity Tolerance: Patient limited by fatigue Patient left: in chair;with call bell/phone within reach;with chair alarm set Nurse Communication: Mobility status PT Visit Diagnosis: Other abnormalities of gait and mobility (R26.89);Muscle weakness (generalized) (M62.81);Pain;Unsteadiness on feet (R26.81) Pain - Right/Left:  Left Pain - part of body: Hip     Time: 1020-1104 PT Time Calculation (min) (ACUTE ONLY): 44 min  Charges:  $Gait Training: 8-22 mins $Therapeutic Exercise: 8-22 mins $Therapeutic Activity: 8-22 mins                    Zadie Cleverly, PTA  Jannet Askew 06/10/2023, 12:41 PM

## 2023-06-11 ENCOUNTER — Other Ambulatory Visit: Payer: Self-pay

## 2023-06-11 ENCOUNTER — Encounter: Payer: Self-pay | Admitting: Family Medicine

## 2023-06-11 DIAGNOSIS — D649 Anemia, unspecified: Secondary | ICD-10-CM | POA: Diagnosis not present

## 2023-06-11 DIAGNOSIS — R82998 Other abnormal findings in urine: Secondary | ICD-10-CM | POA: Diagnosis not present

## 2023-06-11 DIAGNOSIS — Z7401 Bed confinement status: Secondary | ICD-10-CM | POA: Diagnosis not present

## 2023-06-11 DIAGNOSIS — L89312 Pressure ulcer of right buttock, stage 2: Secondary | ICD-10-CM | POA: Diagnosis not present

## 2023-06-11 DIAGNOSIS — F3289 Other specified depressive episodes: Secondary | ICD-10-CM | POA: Diagnosis not present

## 2023-06-11 DIAGNOSIS — R278 Other lack of coordination: Secondary | ICD-10-CM | POA: Diagnosis not present

## 2023-06-11 DIAGNOSIS — Z79899 Other long term (current) drug therapy: Secondary | ICD-10-CM | POA: Diagnosis not present

## 2023-06-11 DIAGNOSIS — Z8673 Personal history of transient ischemic attack (TIA), and cerebral infarction without residual deficits: Secondary | ICD-10-CM | POA: Diagnosis not present

## 2023-06-11 DIAGNOSIS — E871 Hypo-osmolality and hyponatremia: Secondary | ICD-10-CM | POA: Diagnosis not present

## 2023-06-11 DIAGNOSIS — E440 Moderate protein-calorie malnutrition: Secondary | ICD-10-CM | POA: Diagnosis not present

## 2023-06-11 DIAGNOSIS — Z4889 Encounter for other specified surgical aftercare: Secondary | ICD-10-CM | POA: Diagnosis not present

## 2023-06-11 DIAGNOSIS — N3000 Acute cystitis without hematuria: Secondary | ICD-10-CM | POA: Diagnosis not present

## 2023-06-11 DIAGNOSIS — Z736 Limitation of activities due to disability: Secondary | ICD-10-CM | POA: Diagnosis not present

## 2023-06-11 DIAGNOSIS — M25552 Pain in left hip: Secondary | ICD-10-CM | POA: Diagnosis not present

## 2023-06-11 DIAGNOSIS — N178 Other acute kidney failure: Secondary | ICD-10-CM | POA: Diagnosis not present

## 2023-06-11 DIAGNOSIS — M80052D Age-related osteoporosis with current pathological fracture, left femur, subsequent encounter for fracture with routine healing: Secondary | ICD-10-CM | POA: Diagnosis not present

## 2023-06-11 DIAGNOSIS — R21 Rash and other nonspecific skin eruption: Secondary | ICD-10-CM | POA: Diagnosis not present

## 2023-06-11 DIAGNOSIS — E119 Type 2 diabetes mellitus without complications: Secondary | ICD-10-CM | POA: Diagnosis not present

## 2023-06-11 DIAGNOSIS — K219 Gastro-esophageal reflux disease without esophagitis: Secondary | ICD-10-CM | POA: Diagnosis not present

## 2023-06-11 DIAGNOSIS — S72002D Fracture of unspecified part of neck of left femur, subsequent encounter for closed fracture with routine healing: Secondary | ICD-10-CM | POA: Diagnosis not present

## 2023-06-11 DIAGNOSIS — E11649 Type 2 diabetes mellitus with hypoglycemia without coma: Secondary | ICD-10-CM | POA: Diagnosis not present

## 2023-06-11 DIAGNOSIS — M6259 Muscle wasting and atrophy, not elsewhere classified, multiple sites: Secondary | ICD-10-CM | POA: Diagnosis not present

## 2023-06-11 DIAGNOSIS — M84755A Complete transverse atypical femoral fracture, left leg, initial encounter for fracture: Secondary | ICD-10-CM | POA: Diagnosis not present

## 2023-06-11 DIAGNOSIS — F419 Anxiety disorder, unspecified: Secondary | ICD-10-CM | POA: Diagnosis not present

## 2023-06-11 DIAGNOSIS — G479 Sleep disorder, unspecified: Secondary | ICD-10-CM | POA: Diagnosis not present

## 2023-06-11 DIAGNOSIS — I10 Essential (primary) hypertension: Secondary | ICD-10-CM | POA: Diagnosis not present

## 2023-06-11 DIAGNOSIS — N3001 Acute cystitis with hematuria: Secondary | ICD-10-CM | POA: Diagnosis not present

## 2023-06-11 DIAGNOSIS — E1165 Type 2 diabetes mellitus with hyperglycemia: Secondary | ICD-10-CM | POA: Diagnosis not present

## 2023-06-11 DIAGNOSIS — E785 Hyperlipidemia, unspecified: Secondary | ICD-10-CM | POA: Diagnosis not present

## 2023-06-11 DIAGNOSIS — N390 Urinary tract infection, site not specified: Secondary | ICD-10-CM | POA: Diagnosis not present

## 2023-06-11 DIAGNOSIS — B379 Candidiasis, unspecified: Secondary | ICD-10-CM | POA: Diagnosis not present

## 2023-06-11 LAB — BASIC METABOLIC PANEL
Anion gap: 9 (ref 5–15)
BUN: 16 mg/dL (ref 8–23)
CO2: 27 mmol/L (ref 22–32)
Calcium: 8.2 mg/dL — ABNORMAL LOW (ref 8.9–10.3)
Chloride: 98 mmol/L (ref 98–111)
Creatinine, Ser: 0.71 mg/dL (ref 0.44–1.00)
GFR, Estimated: >60 mL/min (ref >60)
Glucose, Bld: 162 mg/dL — ABNORMAL HIGH (ref 70–99)
Potassium: 4.4 mmol/L (ref 3.5–5.1)
Sodium: 134 mmol/L — ABNORMAL LOW (ref 135–145)

## 2023-06-11 LAB — URINE CULTURE: Culture: >=100000 — AB

## 2023-06-11 LAB — GLUCOSE, CAPILLARY
Glucose-Capillary: 161 mg/dL — ABNORMAL HIGH (ref 70–99)
Glucose-Capillary: 197 mg/dL — ABNORMAL HIGH (ref 70–99)
Glucose-Capillary: 222 mg/dL — ABNORMAL HIGH (ref 70–99)

## 2023-06-11 MED ORDER — LACTULOSE 10 GM/15ML PO SOLN: 20.0000 g | Freq: Every day | ORAL | 0 refills | Status: DC

## 2023-06-11 MED ORDER — INSULIN ASPART 100 UNIT/ML IJ SOLN: 0.0000 [IU] | Freq: Three times a day (TID) | INTRAMUSCULAR | 11 refills | Status: DC

## 2023-06-11 MED ORDER — OXYCODONE HCL 5 MG PO TABS: 5.0000 mg | ORAL_TABLET | ORAL | 0 refills | Status: DC | PRN

## 2023-06-11 MED ORDER — INSULIN ASPART 100 UNIT/ML IJ SOLN: 3.0000 [IU] | Freq: Three times a day (TID) | INTRAMUSCULAR | 11 refills | Status: DC

## 2023-06-11 MED ORDER — ALPRAZOLAM 0.5 MG PO TABS
0.5000 mg | ORAL_TABLET | Freq: Two times a day (BID) | ORAL | 3 refills | Status: DC | PRN
Start: 2023-06-11 — End: 2023-10-13

## 2023-06-11 MED ORDER — INSULIN GLARGINE-YFGN 100 UNIT/ML ~~LOC~~ SOLN: 8.0000 [IU] | Freq: Every day | SUBCUTANEOUS | 11 refills | Status: DC

## 2023-06-11 MED ORDER — BISACODYL 10 MG RE SUPP: 10.0000 mg | Freq: Every day | RECTAL | 0 refills | Status: DC | PRN

## 2023-06-11 MED ORDER — POLYETHYLENE GLYCOL 3350 17 GM/SCOOP PO POWD
34.0000 g | Freq: Every day | ORAL | 0 refills | Status: DC
  Filled 2023-06-11: qty 238, 7d supply, fill #0

## 2023-06-11 MED ORDER — DOCUSATE SODIUM 100 MG PO CAPS: 100.0000 mg | ORAL_CAPSULE | Freq: Two times a day (BID) | ORAL | 0 refills | Status: DC

## 2023-06-11 MED ORDER — TRAZODONE HCL 50 MG PO TABS: 25.0000 mg | ORAL_TABLET | Freq: Every evening | ORAL | Status: DC | PRN

## 2023-06-11 MED ORDER — METHOCARBAMOL 500 MG PO TABS: 500.0000 mg | ORAL_TABLET | Freq: Four times a day (QID) | ORAL | Status: DC | PRN

## 2023-06-11 MED ORDER — ACETAMINOPHEN 500 MG PO TABS: 1000.0000 mg | ORAL_TABLET | Freq: Three times a day (TID) | ORAL | Status: AC

## 2023-06-11 MED ORDER — ENOXAPARIN SODIUM 40 MG/0.4ML IJ SOSY: 40.0000 mg | PREFILLED_SYRINGE | INTRAMUSCULAR | Status: DC

## 2023-06-11 MED ORDER — CEFDINIR 300 MG PO CAPS
300.0000 mg | ORAL_CAPSULE | Freq: Two times a day (BID) | ORAL | 0 refills | Status: AC
  Filled 2023-06-11: qty 6, 3d supply, fill #0

## 2023-06-11 MED ORDER — ALPRAZOLAM 0.5 MG PO TABS
0.5000 mg | ORAL_TABLET | Freq: Two times a day (BID) | ORAL | 3 refills | Status: DC | PRN
Start: 2023-06-11 — End: 2023-06-11

## 2023-06-11 MED ORDER — SENNA 8.6 MG PO TABS: 1.0000 | ORAL_TABLET | Freq: Two times a day (BID) | ORAL | 0 refills | Status: DC

## 2023-06-11 MED ORDER — INSULIN GLARGINE-YFGN 100 UNIT/ML ~~LOC~~ SOLN: 18.0000 [IU] | Freq: Every day | SUBCUTANEOUS | 11 refills | Status: DC

## 2023-06-11 MED ORDER — GLUCERNA SHAKE PO LIQD: 237.0000 mL | Freq: Two times a day (BID) | ORAL | 0 refills | Status: DC

## 2023-06-11 MED ORDER — ONDANSETRON HCL 4 MG PO TABS: 4.0000 mg | ORAL_TABLET | Freq: Four times a day (QID) | ORAL | 0 refills | Status: DC | PRN

## 2023-06-11 NOTE — Discharge Summary (Addendum)
Physician Discharge Summary   Patient: [name] MRN: [id number] DOB: 09/30/1956  Admit date:     05/31/2023  Discharge date: 06/11/2023  Discharge Physician: Pennie Banter   PCP: Malva Limes, MD   Recommendations at discharge:    Follow up with [facility] in 10-14 days Continue Lovenox injections for DVT prophylaxis for 14 more days Follow up with Primary Care in 1-2 weeks Repeat CBC, BMP, Mg in 1-2 weeks Monitor glucose control and adjust regimen as needed  Discharge Diagnoses: Principal Problem:   Closed left hip fracture (HCC) Active Problems:   Uncontrolled type 2 diabetes mellitus with hyperglycemia, without long-term current use of insulin (HCC)   Hyponatremia   Anxiety and depression   Dyslipidemia   History of CVA with residual deficit   S/P CABG x 4   Osteoporosis   UTI (urinary tract infection)  Resolved Problems:   Hypertensive urgency   Hypoglycemia  Hospital Course: 67 y.o. female with medical history significant for disease status post four-vessel CABG, type 2 diabetes mellitus, hypertension, CVA, dyslipidemia, GERD, depression and anxiety admitted for left femoral neck fracture status post fall  67: Ortho consult, plan for surgery tomorrow  Further hospital course and management as outlined below.   06/11/23:  pt doing well, progressing with therapy.  Reports pain control is improved.  No longer having dysuria.  No fever/chills. Medically stable for d/c to rehab today.    Assessment and Plan:  Closed left hip fracture (HCC) - Left hip hemiarthroplasty 6/19 - Pain management --Bowel regimen - Lovenox for dvt ppx - Follow up [facility] in 10-14 days - Weight-bearing as tolerated on left leg - Posterior hip precautions - outpt pcp f/u to address osteoarthritis   UTI - unknown if POA. UA obtained in cross coverage, early AM 6/26 -- large leukocytes, positive nitrite, many bacteria, > 50 wbc's Urine culture +GNR's, growing  Klebsiella pneumoniae -  --Improved with empiric Rocephin --Discharge w 3 more days Omnicef --Follow pending urine culture susceptibilities   Uncontrolled type 2 diabetes mellitus with hyperglycemia, without long-term current use of insulin (HCC) Hemoglobin A1c 11 suggestive of poor blood sugar control Home oral meds were held, resume at discharge: --Metformin, glyburide and Jardiance  --Also on Semglee basal insulin and sliding scale Novolog --Closely monitor blood glucose --PCP follow up in 1-2 weeks   Hyponatremia Baseline 135, trended down due to poor PO intake. Improved with IV fluids - encourage PO hydration - repeat BMP at follow up   Hypertension --home amlodipine and losartan have been on hold.   BP has been controlled Hold meds at d/c and follow up with PCP   Dyslipidemia - continue statin therapy    Hx CVA - cont aspirin, rosuvastatin   Anxiety and depression - continue home Xanax and Zoloft.  -- As needed trazodone for sleep        Consultants: Orthopedic surgery Procedures performed: Left hip hemiarthroplasty   Disposition: Skilled nursing facility  Diet recommendation:  Discharge Diet Orders (From admission, onward)     Start     Ordered   06/11/23 0000  Diet - low sodium& carb modified      06/11/23 1050            DISCHARGE MEDICATION: Allergies as of 06/11/2023       Reactions   Jardiance [empagliflozin] Itching   Recurrent mycotic infections   Penicillins Rash, Other (See Comments)   Has patient had a PCN reaction causing immediate  rash, facial/tongue/throat swelling, SOB or lightheadedness with hypotension: No Has patient had a PCN reaction causing severe rash involving mucus membranes or skin necrosis: No Has patient had a PCN reaction that required hospitalization No Has patient had a PCN reaction occurring within the last 10 years: No If all of the above answers are "NO", then may proceed with Cephalosporin use.         Medication List     STOP taking these medications    amLODipine 10 MG tablet Commonly known as: NORVASC   ciprofloxacin 0.3 % ophthalmic solution Commonly known as: Ciloxan   losartan 100 MG tablet Commonly known as: COZAAR   montelukast 10 MG tablet Commonly known as: SINGULAIR   Vitamin D (Ergocalciferol) 1.25 MG (50000 UNIT) Caps capsule Commonly known as: DRISDOL       TAKE these medications    acetaminophen 500 MG tablet Commonly known as: TYLENOL Take 2 tablets (1,000 mg total) by mouth every 8 (eight) hours. What changed:  medication strength how much to take when to take this reasons to take this   ALPRAZolam 0.5 MG tablet Commonly known as: XANAX Take 1 tablet (0.5 mg total) by mouth 2 (two) times daily as needed for anxiety   aspirin EC 81 MG tablet Take 1 tablet (81 mg total) by mouth daily. Swallow whole.   bisacodyl 10 MG suppository Commonly known as: DULCOLAX Place 1 suppository (10 mg total) rectally daily as needed for moderate constipation.   cefdinir 300 MG capsule Commonly known as: OMNICEF Take 1 capsule (300 mg total) by mouth 2 (two) times daily for 3 days.   docusate sodium 100 MG capsule Commonly known as: COLACE Take 1 capsule (100 mg total) by mouth 2 (two) times daily. Hold if having loose or frequent BM's   empagliflozin 10 MG Tabs tablet Commonly known as: Jardiance Take 1 tablet (10 mg total) by mouth daily before breakfast.   enoxaparin 40 MG/0.4ML injection Commonly known as: LOVENOX Inject 0.4 mLs (40 mg total) into the skin daily for 14 days. Start taking on: June 12, 2023   feeding supplement (GLUCERNA SHAKE) Liqd Take 237 mLs by mouth 2 (two) times daily between meals.   FreeStyle Freedom Lite w/Device Kit USE AS DIRECTED   OneTouch Verio Reflect w/Device Kit 1 each by Does not apply route in the morning, at noon, and at bedtime.   FREESTYLE LITE test strip Generic drug: glucose blood Also needs Lancets.  Use to check blood sugar up to four times a day for insulin dependant diabetes   glyBURIDE 5 MG tablet Commonly known as: DIABETA Take 2 tablets (10 mg total) by mouth 2 (two) times daily with a meal.   insulin aspart 100 UNIT/ML injection Commonly known as: novoLOG Inject 0-9 Units into the skin 3 (three) times daily with meals. Take 0-9 units 3 times daily with meals as follows: If CBG 70-120: 0 units If CBG 121-150: 1 unit If CBG 151-200: 2 units If CBG 201-250: 3 units If CBG 251-300: 5 units If CBG 301-350: 7 units If CBG 351-400: 9 units If CBG over 400: 12 units and call MD   insulin glargine-yfgn 100 UNIT/ML injection Commonly known as: SEMGLEE Inject 0.08 mLs (8 Units total) into the skin daily.   lactulose 10 GM/15ML solution Commonly known as: CHRONULAC Take 30 mLs (20 g total) by mouth daily. Hold if having loose or frequent stools Start taking on: June 12, 2023   latanoprost 0.005 % ophthalmic solution  Commonly known as: XALATAN Place 1 drop into both eyes Nightly.   metFORMIN 500 MG 24 hr tablet Commonly known as: GLUCOPHAGE-XR Take 2 tablets (1,000 mg total) by mouth 2 (two) times daily with a meal.   methocarbamol 500 MG tablet Commonly known as: ROBAXIN Take 1 tablet (500 mg total) by mouth every 6 (six) hours as needed for muscle spasms.   multivitamin tablet Take 1 tablet by mouth daily.   ondansetron 4 MG tablet Commonly known as: ZOFRAN Take 1 tablet (4 mg total) by mouth every 6 (six) hours as needed for nausea.   oxybutynin 10 MG 24 hr tablet Commonly known as: DITROPAN-XL Take 1 tablet (10 mg total) by mouth daily.   oxyCODONE 5 MG immediate release tablet Commonly known as: Oxy IR/ROXICODONE Take 1-2 tablets (5-10 mg total) by mouth every 4 (four) hours as needed for moderate pain or severe pain.   polyethylene glycol powder 17 GM/SCOOP powder Commonly known as: GLYCOLAX/MIRALAX Take 34 gc (2 scoops) by mouth daily. Mix as directed.  Hold if having loose or frequent BM's. Start taking on: June 12, 2023   rosuvastatin 40 MG tablet Commonly known as: CRESTOR Take 1 tablet (40 mg total) by mouth daily.   senna 8.6 MG Tabs tablet Commonly known as: SENOKOT Take 1 tablet (8.6 mg total) by mouth 2 (two) times daily. Hold if having loose or frequent BM's   sertraline 25 MG tablet Commonly known as: Zoloft Take 1 tablet by mouth at bedtime.   traZODone 50 MG tablet Commonly known as: DESYREL Take 0.5 tablets (25 mg total) by mouth at bedtime as needed for sleep.        Discharge Exam: Filed Weights   05/31/23 1955 06/02/23 1236  Weight: 79.4 kg 79.4 kg   General exam: awake, alert, no acute distress HEENT: atraumatic, clear conjunctiva, anicteric sclera, moist mucus membranes, hearing grossly normal  Respiratory system: CTAB, no wheezes, rales or rhonchi, normal respiratory effort. Cardiovascular system: normal S1/S2, RRR, no pedal edema.   Gastrointestinal system: soft, NT, ND Central nervous system: A&O x 3. no gross focal neurologic deficits, normal speech Extremities: moves all, no edema, normal tone Skin: dry, intact, normal temperature Psychiatry: normal mood, congruent affect, judgement and insight appear normal    Condition at discharge: stable  The results of significant diagnostics from this hospitalization (including imaging, microbiology, ancillary and laboratory) are listed below for reference.   Imaging Studies: DG Chest Port 1 View  Result Date: 06/04/2023 CLINICAL DATA:  Hypoxia, recent hip arthroplasty EXAM: PORTABLE CHEST 1 VIEW COMPARISON:  05/31/2023 FINDINGS: Single frontal view of the chest demonstrates stable postsurgical changes from CABG. Cardiac silhouette is unremarkable. No airspace disease, effusion, or pneumothorax. No acute bony abnormality. IMPRESSION: 1. No acute intrathoracic process. Electronically Signed   By: Sharlet Salina M.D.   On: 06/04/2023 14:02   DG Hip Port  Unilat With Pelvis 1V Left  Result Date: 06/02/2023 CLINICAL DATA:  Hip fracture. Postoperative for left hip arthroplasty. EXAM: DG HIP (WITH OR WITHOUT PELVIS) 1V PORT LEFT COMPARISON:  05/31/2023 FINDINGS: Interval left hip hemiarthroplasty noted. No periprosthetic fracture or acute complicating feature. There a right-sided (contralateral) total hip prosthesis. IMPRESSION: 1. Interval left hip hemiarthroplasty without complicating feature. Electronically Signed   By: Gaylyn Rong M.D.   On: 06/02/2023 16:44   Chest Portable 1 View  Result Date: 05/31/2023 CLINICAL DATA:  Preop chest radiograph. EXAM: PORTABLE CHEST 1 VIEW COMPARISON:  Chest radiograph dated 11/05/2021. FINDINGS: The  lungs are clear. There is no pleural effusion or pneumothorax. The cardiac silhouette is within normal limits. Median sternotomy wires and CABG vascular clips. No acute osseous pathology. IMPRESSION: No active disease. Electronically Signed   By: Elgie Collard M.D.   On: 05/31/2023 23:43   DG Hip Unilat W or Wo Pelvis 2-3 Views Left  Result Date: 05/31/2023 CLINICAL DATA:  Fall EXAM: DG HIP (WITH OR WITHOUT PELVIS) 2-3V LEFT COMPARISON:  CT 08/23/2021 FINDINGS: SI joints are non widened. Pubic symphysis and rami appear intact. Right hip replacement with intact hardware and normal alignment. Possible dystrophic calcification inferior to the right ischium. Acute left femoral neck fracture with apex anterior and lateral angulation. No femoral head dislocation IMPRESSION: Acute left femoral neck fracture. Electronically Signed   By: Jasmine Pang M.D.   On: 05/31/2023 21:00    Microbiology: Results for orders placed or performed during the hospital encounter of 05/31/23  Surgical pcr screen     Status: None   Collection Time: 06/01/23  5:11 PM   Specimen: Nasal Mucosa; Nasal Swab  Result Value Ref Range Status   MRSA, PCR NEGATIVE NEGATIVE Final   Staphylococcus aureus NEGATIVE NEGATIVE Final    Comment:  (NOTE) The Xpert SA Assay (FDA approved for NASAL specimens in patients 81 years of age and older), is one component of a comprehensive surveillance program. It is not intended to diagnose infection nor to guide or monitor treatment. Performed at [facility], [address] [place], [place] [address]   Urine Culture     Status: Abnormal   Collection Time: 06/09/23  8:50 AM   Specimen: Urine, Random  Result Value Ref Range Status   Specimen Description   Final    URINE, RANDOM Performed at [facility], [address] [place], [place] [address]    Special Requests   Final    NONE Reflexed from [id number] Performed at [facility], [address] [place], [place] [address]    Culture >=100,000 COLONIES/mL KLEBSIELLA PNEUMONIAE (A)  Final   Report Status 06/11/2023 FINAL  Final   Organism ID, Bacteria KLEBSIELLA PNEUMONIAE (A)  Final      Susceptibility   Klebsiella pneumoniae - MIC*    AMPICILLIN RESISTANT Resistant     CEFAZOLIN <=4 SENSITIVE Sensitive     CEFEPIME <=0.12 SENSITIVE Sensitive     CEFTRIAXONE <=0.25 SENSITIVE Sensitive     CIPROFLOXACIN <=0.25 SENSITIVE Sensitive     GENTAMICIN <=1 SENSITIVE Sensitive     IMIPENEM <=0.25 SENSITIVE Sensitive     NITROFURANTOIN <=16 SENSITIVE Sensitive     TRIMETH/SULFA <=20 SENSITIVE Sensitive     AMPICILLIN/SULBACTAM 4 SENSITIVE Sensitive     PIP/TAZO <=4 SENSITIVE Sensitive     * >=100,000 COLONIES/mL KLEBSIELLA PNEUMONIAE    Labs: CBC: Recent Labs  Lab 06/05/23 0440 06/08/23 0425  WBC 10.4 9.5  HGB 12.0 10.5*  HCT 37.3 31.8*  MCV 86.9 85.3  PLT 249 305   Basic Metabolic Panel: Recent Labs  Lab 06/07/23 0817 06/08/23 0425 06/09/23 0405 06/10/23 0544 06/11/23 0526  NA 131* 129* 130* 131* 134*  K 4.2 4.2 4.1 4.1 4.4  CL 93* 93* 92* 96* 98  CO2 27 28 29 26 27   GLUCOSE 175* 128* 118* 123* 162*  BUN 41* 38* 26* 19 16  CREATININE 1.09* 1.01* 0.92 0.82 0.71  CALCIUM 8.4* 8.2* 8.2*  7.9* 8.2*   Liver Function Tests: No results for input(s): "AST", "ALT", "ALKPHOS", "BILITOT", "PROT", "ALBUMIN"  in the last 168 hours. CBG: Recent Labs  Lab 06/10/23 1626 06/10/23 2121 06/11/23 0738 06/11/23 1151 06/11/23 1211  GLUCAP 158* 158* 161* 197* 222*    Discharge time spent: greater than 30 minutes.  Signed: Pennie Banter, DO [facility] 06/11/2023

## 2023-06-11 NOTE — TOC Transition Note (Addendum)
Transition of Care [facility]) - CM/SW Discharge Note   Patient Details  Name: [name] MRN: [id number] Date of Birth: [birth year]  Transition of Care [facility]) CM/SW Contact:  Liliana Cline, LCSW Phone Number: 06/11/2023, 10:40 AM   Clinical Narrative:    Discharge to [facility] in [place] today. Room 704.  Confirmed with Admissions Worker Almira Coaster.  Updated MD, RN, and patient. Asked RN to call report. EMS paperwork completed. Will call for EMS when patient is ready for transport.   1:12- [facility] arranged for transport to [facility]. Patient is 4th on the list. RN notified.     Final next level of care: Skilled Nursing Facility Barriers to Discharge: Barriers Resolved   Patient Goals and CMS Choice CMS Medicare.gov Compare Post Acute Care list provided to:: Patient Choice offered to / list presented to : Patient  Discharge Placement                Patient chooses bed at: [facility] [place] Patient to be transferred to facility by: [facility] Name of family member notified: patient Patient and family notified of of transfer: 06/11/23  Discharge Plan and Services Additional resources added to the After Visit Summary for     Discharge Planning Services: CM Consult                                 Social Determinants of Health (SDOH) Interventions SDOH Screenings   Food Insecurity: No Food Insecurity (06/01/2023)  Housing: Low Risk  (06/01/2023)  Transportation Needs: No Transportation Needs (06/01/2023)  Utilities: Not At Risk (06/01/2023)  Alcohol Screen: Low Risk  (04/28/2023)  Depression (PHQ2-9): Low Risk  (04/28/2023)  Recent Concern: Depression (PHQ2-9) - High Risk (02/10/2023)  Financial Resource Strain: Low Risk  (04/28/2023)  Physical Activity: Inactive (04/28/2023)  Social Connections: Socially Isolated (04/28/2023)  Stress: Stress Concern Present (04/28/2023)  Tobacco Use: Medium Risk (06/09/2023)     Readmission Risk Interventions    08/11/2021    1:30  PM  Readmission Risk Prevention Plan  Transportation Screening Complete  HRI or Home Care Consult Complete  Social Work Consult for Recovery Care Planning/Counseling Complete  Palliative Care Screening Not Applicable  Medication Review Oceanographer) Complete

## 2023-06-11 NOTE — Progress Notes (Signed)
  Subjective:  POD #9 s/p left hip hemiarthroplasty.   Patient reports left hip pain as mild to moderate.  Patient sitting up in a chair.  Her daughter is at the bedside with her grandchildren.  Patient is being treated for UTI.  She is making progress with physical therapy.  Objective:   VITALS:   Vitals:   06/10/23 0746 06/10/23 1532 06/10/23 2122 06/11/23 0919  BP: (!) 133/59 134/76 (!) 129/91 (!) 142/65  Pulse: 66 63 70 76  Resp: 16 17 18 15   Temp: 98.3 F (36.8 C) 98.5 F (36.9 C) 98.2 F (36.8 C) 98.2 F (36.8 C)  TempSrc:   Oral   SpO2: 93% 93% 98% 91%  Weight:      Height:        PHYSICAL EXAM: Left lower extremity Neurovascular intact Sensation intact distally Intact pulses distally Dorsiflexion/Plantar flexion intact Aquacel dressing: scant drainage No cellulitis present Compartment soft  LABS  Results for orders placed or performed during the hospital encounter of 05/31/23 (from the past 24 hour(s))  Glucose, capillary     Status: Abnormal   Collection Time: 06/10/23  4:26 PM  Result Value Ref Range   Glucose-Capillary 158 (H) 70 - 99 mg/dL  Glucose, capillary     Status: Abnormal   Collection Time: 06/10/23  9:21 PM  Result Value Ref Range   Glucose-Capillary 158 (H) 70 - 99 mg/dL  Basic metabolic panel     Status: Abnormal   Collection Time: 06/11/23  5:26 AM  Result Value Ref Range   Sodium 134 (L) 135 - 145 mmol/L   Potassium 4.4 3.5 - 5.1 mmol/L   Chloride 98 98 - 111 mmol/L   CO2 27 22 - 32 mmol/L   Glucose, Bld 162 (H) 70 - 99 mg/dL   BUN 16 8 - 23 mg/dL   Creatinine, Ser 6.04 0.44 - 1.00 mg/dL   Calcium 8.2 (L) 8.9 - 10.3 mg/dL   GFR, Estimated >54 >09 mL/min   Anion gap 9 5 - 15  Glucose, capillary     Status: Abnormal   Collection Time: 06/11/23  7:38 AM  Result Value Ref Range   Glucose-Capillary 161 (H) 70 - 99 mg/dL  Glucose, capillary     Status: Abnormal   Collection Time: 06/11/23 11:51 AM  Result Value Ref Range    Glucose-Capillary 197 (H) 70 - 99 mg/dL  Glucose, capillary     Status: Abnormal   Collection Time: 06/11/23 12:11 PM  Result Value Ref Range   Glucose-Capillary 222 (H) 70 - 99 mg/dL    No results found.  Assessment/Plan: 9 Days Post-Op   Principal Problem:   Closed left hip fracture (HCC) Active Problems:   Anxiety and depression   History of CVA with residual deficit   Hyponatremia   S/P CABG x 4   Uncontrolled type 2 diabetes mellitus with hyperglycemia, without long-term current use of insulin (HCC)   Dyslipidemia   Osteoporosis   UTI (urinary tract infection)  Patient is making good progress with physical therapy.  She is doing well from orthopedic standpoint.  The patient will continue Lovenox for DVT prophylaxis until follow-up at [facility] in [place] in 10 to 14 days.  She is weightbearing as tolerated on left lower extremity with posterior hip precautions and needs to continue PT at SNF.    Juanell Fairly , MD 06/11/2023, 12:49 PM

## 2023-06-11 NOTE — TOC Progression Note (Signed)
Transition of Care [facility]) - Progression Note    Patient Details  Name: [name] MRN: [id number] Date of Birth: 02-19-1956  [facility]) CM/SW Contact  Liliana Cline, LCSW Phone Number: 06/11/2023, 10:02 AM  Clinical Narrative:    CSW notified by Almira Coaster at [facility] that patient can come today - room 704. Asked DO if patient is medically ready. DO is checking.   Expected Discharge Plan: Skilled Nursing Facility Barriers to Discharge: SNF Pending bed offer, Insurance Authorization  Expected Discharge Plan and Services   Discharge Planning Services: CM Consult   Living arrangements for the past 2 months: Single Family Home                                       Social Determinants of Health (SDOH) Interventions SDOH Screenings   Food Insecurity: No Food Insecurity (06/01/2023)  Housing: Low Risk  (06/01/2023)  Transportation Needs: No Transportation Needs (06/01/2023)  Utilities: Not At Risk (06/01/2023)  Alcohol Screen: Low Risk  (04/28/2023)  Depression (PHQ2-9): Low Risk  (04/28/2023)  Recent Concern: Depression (PHQ2-9) - High Risk (02/10/2023)  Financial Resource Strain: Low Risk  (04/28/2023)  Physical Activity: Inactive (04/28/2023)  Social Connections: Socially Isolated (04/28/2023)  Stress: Stress Concern Present (04/28/2023)  Tobacco Use: Medium Risk (06/09/2023)    Readmission Risk Interventions    08/11/2021    1:30 PM  Readmission Risk Prevention Plan  Transportation Screening Complete  HRI or Home Care Consult Complete  Social Work Consult for Recovery Care Planning/Counseling Complete  Palliative Care Screening Not Applicable  Medication Review Oceanographer) Complete

## 2023-06-11 NOTE — Progress Notes (Signed)
Physical Therapy Treatment Patient Details Name: [name] MRN: [id number] DOB: 1956/04/18 Today's Date: 06/11/2023   History of Present Illness Pt is a 67 y.o. female with PMH consisting of HTN, CAD, CABG, CVA (L sided symptoms), type 2 DM. Pt admitted to ED (6/17) with left hip pain following a mechanical fall at work. MD assessment includes: closed left femoral neck fx. Pt is now s/p left hip hemiarthroplasty (6/19).    PT Comments    Pt continues to make excellent progress with skilled PT. Transfers have improved to MinA with gait distance increased to 58ft with RW in room with CGA and vc's for sequencing. Pt will continue to progress at skilled rehab with increased time for rest breaks, encouragement, and cues to reduce anxiety/fear of falling. She remains very motivated with supportive family. Will continue per POC.   Recommendations for follow up therapy are one component of a multi-disciplinary discharge planning process, led by the attending physician.  Recommendations may be updated based on patient status, additional functional criteria and insurance authorization.  Follow Up Recommendations  Can patient physically be transported by private vehicle: No    Assistance Recommended at Discharge Intermittent Supervision/Assistance  Patient can return home with the following Help with stairs or ramp for entrance;Assist for transportation;Assistance with cooking/housework;A little help with walking and/or transfers;A little help with bathing/dressing/bathroom   Equipment Recommendations  BSC/3in1    Recommendations for Other Services       Precautions / Restrictions Precautions Precautions: Posterior Hip;Fall Precaution Booklet Issued: Yes (comment) Precaution Comments: Pt able to recall precautions Required Braces or Orthoses: Other Brace Other Brace: Hip abduction brace Restrictions Weight Bearing Restrictions: Yes LLE Weight Bearing: Weight bearing as  tolerated Other Position/Activity Restrictions: Pt has been anxious due to fear of increased pain with mobility     Mobility  Bed Mobility Overal bed mobility: Needs Assistance Bed Mobility: Sit to Supine     Supine to sit: Min assist, HOB elevated     General bed mobility comments: Min A supine > sit for LLE management    Transfers Overall transfer level: Needs assistance Equipment used: Rolling walker (2 wheels) Transfers: Sit to/from Stand, Bed to chair/wheelchair/BSC Sit to Stand: Min assist, Mod assist   Step pivot transfers: Min assist            Ambulation/Gait Ambulation/Gait assistance: Min guard Gait Distance (Feet):  (16) Assistive device: Rolling walker (2 wheels) Gait Pattern/deviations: Step-to pattern, Antalgic, Decreased stance time - left, Decreased weight shift to left, Shuffle Gait velocity: decreased     General Gait Details:  (Progressed gait in room this session)   Stairs             Wheelchair Mobility    Modified Rankin (Stroke Patients Only)       Balance Overall balance assessment: Needs assistance Sitting-balance support: Single extremity supported, Feet supported Sitting balance-Leahy Scale: Fair     Standing balance support: Bilateral upper extremity supported, During functional activity, Reliant on assistive device for balance Standing balance-Leahy Scale: Fair                              Cognition Arousal/Alertness: Awake/alert Behavior During Therapy: WFL for tasks assessed/performed Overall Cognitive Status: Within Functional Limits for tasks assessed  General Comments:  (Pt requires frequent rest breaks and tends to "over think" tasks)        Exercises Total Joint Exercises Ankle Circles/Pumps: AROM, Both, 10 reps, Supine Long Arc Quad: AROM, Strengthening, Both, 10 reps    General Comments General comments (skin integrity, edema, etc.): Pt  educated on current LOF, precautions, safe transfers, remaining goals and preparing for transition to Rehab      Pertinent Vitals/Pain Pain Assessment Pain Assessment: 0-10 Pain Score: 3  Pain Location: left hip Pain Descriptors / Indicators: Aching, Discomfort Pain Intervention(s): Premedicated before session    Home Living                          Prior Function            PT Goals (current goals can now be found in the care plan section) Acute Rehab PT Goals Patient Stated Goal: Walk better to go back to work Progress towards PT goals: Progressing toward goals    Frequency    BID      PT Plan Current plan remains appropriate    Co-evaluation              AM-PAC PT "6 Clicks" Mobility   Outcome Measure  Help needed turning from your back to your side while in a flat bed without using bedrails?: A Lot Help needed moving from lying on your back to sitting on the side of a flat bed without using bedrails?: A Lot Help needed moving to and from a bed to a chair (including a wheelchair)?: A Little Help needed standing up from a chair using your arms (e.g., wheelchair or bedside chair)?: A Little Help needed to walk in hospital room?: A Little Help needed climbing 3-5 steps with a railing? : A Lot 6 Click Score: 15    End of Session Equipment Utilized During Treatment: Gait belt Activity Tolerance: Patient tolerated treatment well Patient left: in chair;with call bell/phone within reach;with chair alarm set Nurse Communication: Mobility status PT Visit Diagnosis: Other abnormalities of gait and mobility (R26.89);Muscle weakness (generalized) (M62.81);Pain;Unsteadiness on feet (R26.81) Pain - Right/Left: Left Pain - part of body: Hip     Time: 1101-1140 PT Time Calculation (min) (ACUTE ONLY): 39 min  Charges:  $Gait Training: 8-22 mins $Therapeutic Exercise: 8-22 mins $Therapeutic Activity: 8-22 mins                    Zadie Cleverly,  PTA  Jannet Askew 06/11/2023, 11:46 AM

## 2023-06-11 NOTE — Progress Notes (Signed)
Discharged. Report call to [facility]. AVS reviewed , all questions answered.

## 2023-06-11 NOTE — Progress Notes (Signed)
Physical Therapy Treatment Patient Details Name: [name] MRN: [id number] DOB: Apr 30, 1956 Today's Date: 06/11/2023   History of Present Illness Pt is a 67 y.o. female with PMH consisting of HTN, CAD, CABG, CVA (L sided symptoms), type 2 DM. Pt admitted to ED (6/17) with left hip pain following a mechanical fall at work. MD assessment includes: closed left femoral neck fx. Pt is now s/p left hip hemiarthroplasty (6/19).    PT Comments    Pt tolerated 2 hours up in chair, now with c/o fatigue requesting return to bed. Pt continues to transfer sit<>stand with Min/ModA, CGA for short distance gait in room this pm due to 6/10 L hip pain with mobility. ModA for sit to supine to help lift LE's up onto bed. Pt positioned to comfort, all needs met. Nursing contacted to assist pt with d/c prep.    Recommendations for follow up therapy are one component of a multi-disciplinary discharge planning process, led by the attending physician.  Recommendations may be updated based on patient status, additional functional criteria and insurance authorization.  Follow Up Recommendations  Can patient physically be transported by private vehicle: No    Assistance Recommended at Discharge Intermittent Supervision/Assistance  Patient can return home with the following Help with stairs or ramp for entrance;Assist for transportation;Assistance with cooking/housework;A little help with walking and/or transfers;A little help with bathing/dressing/bathroom   Equipment Recommendations  BSC/3in1    Recommendations for Other Services       Precautions / Restrictions Precautions Precautions: Posterior Hip;Fall Precaution Booklet Issued: Yes (comment) Precaution Comments: Pt able to recall precautions Required Braces or Orthoses: Other Brace Other Brace: Hip abduction brace Restrictions Weight Bearing Restrictions: Yes LLE Weight Bearing: Weight bearing as tolerated Other Position/Activity Restrictions: Pt  has been anxious due to fear of increased pain with mobility     Mobility  Bed Mobility Overal bed mobility: Needs Assistance Bed Mobility: Supine to Sit     Supine to sit: Min assist, HOB elevated Sit to supine: Mod assist, Min assist   General bed mobility comments: Min A supine > sit for LLE management    Transfers Overall transfer level: Needs assistance Equipment used: Rolling walker (2 wheels) Transfers: Sit to/from Stand, Bed to chair/wheelchair/BSC Sit to Stand: Min assist, Mod assist   Step pivot transfers: Min assist            Ambulation/Gait Ambulation/Gait assistance: Min guard Gait Distance (Feet):  (5) Assistive device: Rolling walker (2 wheels) Gait Pattern/deviations: Step-to pattern, Antalgic, Decreased stance time - left, Decreased weight shift to left, Shuffle Gait velocity: decreased     General Gait Details:  (Short distance gait with RW back to bed. Limited due to pain)   Stairs             Wheelchair Mobility    Modified Rankin (Stroke Patients Only)       Balance Overall balance assessment: Needs assistance Sitting-balance support: Single extremity supported, Feet supported Sitting balance-Leahy Scale: Good     Standing balance support: Bilateral upper extremity supported, During functional activity, Reliant on assistive device for balance Standing balance-Leahy Scale: Fair                              Cognition Arousal/Alertness: Awake/alert Behavior During Therapy: WFL for tasks assessed/performed Overall Cognitive Status: Within Functional Limits for tasks assessed  General Comments:  (cues to reduce anxiety)        Exercises Total Joint Exercises Ankle Circles/Pumps: AROM, Both, 10 reps, Supine Long Arc Quad: AROM, Strengthening, Both, 10 reps    General Comments General comments (skin integrity, edema, etc.):  (Educated on safe transfers, mobility, and  sequencing during gait training with fair carry over)      Pertinent Vitals/Pain Pain Assessment Pain Assessment: 0-10 Pain Score: 6  (with mobility) Pain Location: left hip Pain Descriptors / Indicators: Aching, Discomfort Pain Intervention(s): Patient requesting pain meds-RN notified, Repositioned    Home Living                          Prior Function            PT Goals (current goals can now be found in the care plan section) Acute Rehab PT Goals Patient Stated Goal: Walk better to go back to work Progress towards PT goals: Progressing toward goals    Frequency    BID      PT Plan Current plan remains appropriate    Co-evaluation              AM-PAC PT "6 Clicks" Mobility   Outcome Measure  Help needed turning from your back to your side while in a flat bed without using bedrails?: A Lot Help needed moving from lying on your back to sitting on the side of a flat bed without using bedrails?: A Lot Help needed moving to and from a bed to a chair (including a wheelchair)?: A Little Help needed standing up from a chair using your arms (e.g., wheelchair or bedside chair)?: A Little Help needed to walk in hospital room?: A Little Help needed climbing 3-5 steps with a railing? : A Lot 6 Click Score: 15    End of Session Equipment Utilized During Treatment: Gait belt Activity Tolerance: Patient tolerated treatment well Patient left: in bed;with call bell/phone within reach;with bed alarm set Nurse Communication: Mobility status PT Visit Diagnosis: Other abnormalities of gait and mobility (R26.89);Muscle weakness (generalized) (M62.81);Pain;Unsteadiness on feet (R26.81) Pain - Right/Left: Left Pain - part of body: Hip     Time: 1191-4782 PT Time Calculation (min) (ACUTE ONLY): 26 min  Charges:  $Gait Training: 8-22 mins $Therapeutic Exercise: 8-22 mins $Therapeutic Activity: 8-22 mins                    Zadie Cleverly, PTA  Jannet Askew 06/11/2023, 2:40 PM

## 2023-06-14 ENCOUNTER — Other Ambulatory Visit: Payer: Self-pay

## 2023-06-14 DIAGNOSIS — M84755A Complete transverse atypical femoral fracture, left leg, initial encounter for fracture: Secondary | ICD-10-CM | POA: Diagnosis not present

## 2023-06-14 DIAGNOSIS — N3000 Acute cystitis without hematuria: Secondary | ICD-10-CM | POA: Diagnosis not present

## 2023-06-14 DIAGNOSIS — B379 Candidiasis, unspecified: Secondary | ICD-10-CM | POA: Diagnosis not present

## 2023-06-15 DIAGNOSIS — Z79899 Other long term (current) drug therapy: Secondary | ICD-10-CM | POA: Diagnosis not present

## 2023-06-18 DIAGNOSIS — E440 Moderate protein-calorie malnutrition: Secondary | ICD-10-CM | POA: Diagnosis not present

## 2023-06-18 DIAGNOSIS — E871 Hypo-osmolality and hyponatremia: Secondary | ICD-10-CM | POA: Diagnosis not present

## 2023-06-18 DIAGNOSIS — M84755A Complete transverse atypical femoral fracture, left leg, initial encounter for fracture: Secondary | ICD-10-CM | POA: Diagnosis not present

## 2023-06-18 DIAGNOSIS — N178 Other acute kidney failure: Secondary | ICD-10-CM | POA: Diagnosis not present

## 2023-06-21 DIAGNOSIS — R82998 Other abnormal findings in urine: Secondary | ICD-10-CM | POA: Diagnosis not present

## 2023-06-21 DIAGNOSIS — M84755A Complete transverse atypical femoral fracture, left leg, initial encounter for fracture: Secondary | ICD-10-CM | POA: Diagnosis not present

## 2023-06-22 DIAGNOSIS — Z4889 Encounter for other specified surgical aftercare: Secondary | ICD-10-CM | POA: Diagnosis not present

## 2023-06-22 DIAGNOSIS — M25552 Pain in left hip: Secondary | ICD-10-CM | POA: Diagnosis not present

## 2023-06-24 DIAGNOSIS — L89312 Pressure ulcer of right buttock, stage 2: Secondary | ICD-10-CM | POA: Diagnosis not present

## 2023-06-24 DIAGNOSIS — N178 Other acute kidney failure: Secondary | ICD-10-CM | POA: Diagnosis not present

## 2023-06-24 DIAGNOSIS — E11649 Type 2 diabetes mellitus with hypoglycemia without coma: Secondary | ICD-10-CM | POA: Diagnosis not present

## 2023-06-24 DIAGNOSIS — E871 Hypo-osmolality and hyponatremia: Secondary | ICD-10-CM | POA: Diagnosis not present

## 2023-06-28 ENCOUNTER — Other Ambulatory Visit: Payer: Self-pay

## 2023-06-28 DIAGNOSIS — E11649 Type 2 diabetes mellitus with hypoglycemia without coma: Secondary | ICD-10-CM | POA: Diagnosis not present

## 2023-06-28 DIAGNOSIS — M84755A Complete transverse atypical femoral fracture, left leg, initial encounter for fracture: Secondary | ICD-10-CM | POA: Diagnosis not present

## 2023-06-28 DIAGNOSIS — N178 Other acute kidney failure: Secondary | ICD-10-CM | POA: Diagnosis not present

## 2023-06-30 ENCOUNTER — Telehealth: Payer: Self-pay | Admitting: Family Medicine

## 2023-06-30 DIAGNOSIS — E871 Hypo-osmolality and hyponatremia: Secondary | ICD-10-CM | POA: Diagnosis not present

## 2023-06-30 DIAGNOSIS — E11649 Type 2 diabetes mellitus with hypoglycemia without coma: Secondary | ICD-10-CM | POA: Diagnosis not present

## 2023-06-30 DIAGNOSIS — M84755A Complete transverse atypical femoral fracture, left leg, initial encounter for fracture: Secondary | ICD-10-CM | POA: Diagnosis not present

## 2023-06-30 DIAGNOSIS — N178 Other acute kidney failure: Secondary | ICD-10-CM | POA: Diagnosis not present

## 2023-06-30 NOTE — Telephone Encounter (Signed)
Home Health Verbal Orders - Caller/Agency: [facility] Callback Number: [phone]   [name] from [facility] Asking if Dr. Sherrie Mustache will follow on home health orders. Pt is discharging from a skilled nursing facility tomorrow. Please advise.

## 2023-07-01 NOTE — Telephone Encounter (Signed)
That's fine, but needs to be seen in office within 30 days.

## 2023-07-01 NOTE — Telephone Encounter (Signed)
[name] advised

## 2023-07-14 ENCOUNTER — Inpatient Hospital Stay: Payer: PPO | Admitting: Family Medicine

## 2023-07-14 ENCOUNTER — Telehealth: Payer: Self-pay

## 2023-07-14 NOTE — Telephone Encounter (Signed)
Copied from CRM [id number]. Topic: General - Other >> Jul 14, 2023  3:03 PM Turkey B wrote:  Reason for CRM: Roe Coombs from workers [employer], ([employer]) asking info bout pat of when and where pt had imaging done. Please call back if can give this info

## 2023-07-15 ENCOUNTER — Other Ambulatory Visit: Payer: Self-pay

## 2023-07-16 NOTE — Telephone Encounter (Signed)
NA. LMTCB 

## 2023-07-20 ENCOUNTER — Ambulatory Visit (INDEPENDENT_AMBULATORY_CARE_PROVIDER_SITE_OTHER): Payer: PPO | Admitting: Family Medicine

## 2023-07-20 ENCOUNTER — Encounter: Payer: Self-pay | Admitting: Family Medicine

## 2023-07-20 VITALS — BP 163/81 | HR 94 | Temp 97.6°F | Resp 16 | Ht 67.0 in | Wt 170.0 lb

## 2023-07-20 DIAGNOSIS — M25562 Pain in left knee: Secondary | ICD-10-CM

## 2023-07-20 DIAGNOSIS — L89620 Pressure ulcer of left heel, unstageable: Secondary | ICD-10-CM

## 2023-07-20 NOTE — Assessment & Plan Note (Signed)
New, constant since fall  XR knee t

## 2023-07-20 NOTE — Assessment & Plan Note (Addendum)
New  Ambulatory referral to wound care clinic  Encourage redistribution of pressure while at rest

## 2023-07-20 NOTE — Progress Notes (Signed)
Established Patient Office Visit  Subjective   Patient ID: [name], female    DOB: [birth year]  Age: 67 y.o. MRN: [id number]  No chief complaint on file.   [name] is here today for a lesion of her L heel. She noticed this lesion yesterday and it made her worried as her dad almost lost his foot. She broke her L hip in early June, had surgery, and was discharged to an SNF. The SNF did not take care of her health needs. She had blood sugar problems and had trouble with mobility. They let her keep her walker and use the bedside toilet. She layed around a lot and tried to move her feet to the best of her ability. She is still having L knee pain and swelling that has been there since her hospitalization. Her hip is healing nicely and she is following up with her orthopedic surgeon. She currently lives independently and is visited by her daughter who lives close by.     Review of Systems  Musculoskeletal:  Positive for joint pain and myalgias. Negative for falls.      Objective:     BP (!) 163/81 (BP Location: Left Arm, Patient Position: Sitting, Cuff Size: Normal)   Pulse 94   Temp 97.6 F (36.4 C) (Temporal)   Resp 16   Ht 5\' 7"  (1.702 m)   Wt 170 lb (77.1 kg)   SpO2 99%   BMI 26.63 kg/m  BP Readings from Last 3 Encounters:  07/20/23 (!) 163/81  06/11/23 (!) 142/65  02/23/23 (!) 173/92   Wt Readings from Last 3 Encounters:  07/20/23 170 lb (77.1 kg)  06/02/23 175 lb 0.7 oz (79.4 kg)  04/28/23 178 lb (80.7 kg)      Physical Exam Constitutional:      Appearance: Normal appearance.  Musculoskeletal:     Right knee: Normal.     Left knee: Swelling present.  Feet:     Right foot:     Skin integrity: Dry skin present.     Left foot:     Skin integrity: Dry skin present. No ulcer (Unclassified pressure ulcer).  Skin:    Coloration: Skin is ashen.  Neurological:     Mental Status: She is alert.  Eschar over pressure ulcer   No results found for any visits on  07/20/23.  The ASCVD Risk score (Arnett DK, et al., 2019) failed to calculate for the following reasons:   The patient has a prior MI or stroke diagnosis    Assessment & Plan:   Problem List Items Addressed This Visit       Other   Acute pain of left knee - Primary    New, constant since fall  XR knee t      Relevant Orders   DG Knee Complete 4 Views Left   Pressure injury of left heel, unstageable (HCC)    New  Ambulatory referral to wound care clinic  Encourage redistribution of pressure while at rest      Relevant Orders   Ambulatory referral to Wound Clinic    Return if symptoms worsen or fail to improve.    Rometta Emery, Medical Student  Patient seen along with MS3 student Jodi Marble. I personally evaluated this patient along with the student, and verified all aspects of the history, physical exam, and medical decision making as documented by the student. I agree with the student's documentation and have made all necessary edits.  Shirlee Latch  M, MD, MPH [facility]

## 2023-07-21 ENCOUNTER — Telehealth: Payer: Self-pay | Admitting: Family Medicine

## 2023-07-21 ENCOUNTER — Other Ambulatory Visit: Payer: Self-pay

## 2023-07-21 MED ORDER — MELOXICAM 7.5 MG PO TABS
7.5000 mg | ORAL_TABLET | Freq: Two times a day (BID) | ORAL | 1 refills | Status: DC
  Filled 2023-07-21: qty 60, 30d supply, fill #0

## 2023-07-21 NOTE — Telephone Encounter (Signed)
Patient called and stated she seen Dr B yesterday, 07/20/2023 and patient said Dr B was going to ask Dr Sherrie Mustache about pain medication she needed to be on and patient stated she has not heard anything back, please advise.  Patients callback # [phone]

## 2023-07-22 ENCOUNTER — Ambulatory Visit
Admission: RE | Admit: 2023-07-22 | Discharge: 2023-07-22 | Disposition: A | Discharge status: Home / Self Care | Payer: PPO | Source: Ambulatory Visit | Attending: Family Medicine | Admitting: Family Medicine

## 2023-07-22 ENCOUNTER — Ambulatory Visit
Admission: RE | Admit: 2023-07-22 | Discharge: 2023-07-22 | Disposition: A | Discharge status: Home / Self Care | Payer: PPO | Source: Home / Self Care | Attending: Family Medicine | Admitting: Family Medicine

## 2023-07-22 DIAGNOSIS — M25562 Pain in left knee: Secondary | ICD-10-CM | POA: Insufficient documentation

## 2023-07-22 DIAGNOSIS — M1712 Unilateral primary osteoarthritis, left knee: Secondary | ICD-10-CM | POA: Diagnosis not present

## 2023-07-26 NOTE — Telephone Encounter (Signed)
I haven't seen her since February. Oxycodone is not recommended for chronic pain. She can take OTC tylenol.

## 2023-07-26 NOTE — Telephone Encounter (Signed)
Needs office visit.

## 2023-07-28 NOTE — Telephone Encounter (Signed)
LMTCB-Ok for [employer] Nurse to give patient providers message

## 2023-07-29 DIAGNOSIS — M1712 Unilateral primary osteoarthritis, left knee: Secondary | ICD-10-CM | POA: Diagnosis not present

## 2023-07-29 DIAGNOSIS — M25552 Pain in left hip: Secondary | ICD-10-CM | POA: Diagnosis not present

## 2023-07-30 ENCOUNTER — Other Ambulatory Visit: Payer: Self-pay

## 2023-08-02 DIAGNOSIS — M25562 Pain in left knee: Secondary | ICD-10-CM | POA: Diagnosis not present

## 2023-08-02 DIAGNOSIS — G8929 Other chronic pain: Secondary | ICD-10-CM | POA: Diagnosis not present

## 2023-08-02 DIAGNOSIS — M1712 Unilateral primary osteoarthritis, left knee: Secondary | ICD-10-CM | POA: Diagnosis not present

## 2023-08-02 DIAGNOSIS — L89620 Pressure ulcer of left heel, unstageable: Secondary | ICD-10-CM | POA: Diagnosis not present

## 2023-08-03 ENCOUNTER — Other Ambulatory Visit: Payer: Self-pay

## 2023-08-03 ENCOUNTER — Other Ambulatory Visit: Payer: Self-pay | Admitting: Family Medicine

## 2023-08-03 DIAGNOSIS — E1122 Type 2 diabetes mellitus with diabetic chronic kidney disease: Secondary | ICD-10-CM

## 2023-08-03 MED ORDER — ONETOUCH VERIO REFLECT W/DEVICE KIT
1.0000 | PACK | 0 refills | Status: DC
  Filled 2023-08-03: qty 1, 30d supply, fill #0

## 2023-08-03 MED ORDER — FREESTYLE LITE TEST VI STRP
1.0000 | ORAL_STRIP | Freq: Four times a day (QID) | 12 refills | Status: AC
Start: 2023-08-03 — End: ?
  Filled 2023-08-03: qty 100, 25d supply, fill #0
  Filled 2023-08-25: qty 100, 25d supply, fill #1
  Filled 2024-03-06: qty 100, 25d supply, fill #2

## 2023-08-04 ENCOUNTER — Other Ambulatory Visit: Payer: Self-pay

## 2023-08-05 ENCOUNTER — Emergency Department: Payer: PPO

## 2023-08-05 ENCOUNTER — Other Ambulatory Visit: Payer: Self-pay

## 2023-08-05 ENCOUNTER — Observation Stay
Admission: EM | Admit: 2023-08-05 | Discharge: 2023-08-06 | Disposition: A | Discharge status: Home / Self Care | Payer: PPO | Attending: Student | Admitting: Student

## 2023-08-05 ENCOUNTER — Other Ambulatory Visit (HOSPITAL_COMMUNITY): Payer: Self-pay

## 2023-08-05 ENCOUNTER — Encounter: Payer: Self-pay | Admitting: Internal Medicine

## 2023-08-05 ENCOUNTER — Encounter: Payer: PPO | Attending: Physician Assistant | Admitting: Physician Assistant

## 2023-08-05 ENCOUNTER — Observation Stay: Payer: PPO

## 2023-08-05 DIAGNOSIS — R29818 Other symptoms and signs involving the nervous system: Principal | ICD-10-CM | POA: Insufficient documentation

## 2023-08-05 DIAGNOSIS — Z79899 Other long term (current) drug therapy: Secondary | ICD-10-CM | POA: Diagnosis not present

## 2023-08-05 DIAGNOSIS — I69354 Hemiplegia and hemiparesis following cerebral infarction affecting left non-dominant side: Secondary | ICD-10-CM | POA: Insufficient documentation

## 2023-08-05 DIAGNOSIS — R4701 Aphasia: Secondary | ICD-10-CM | POA: Diagnosis not present

## 2023-08-05 DIAGNOSIS — E871 Hypo-osmolality and hyponatremia: Secondary | ICD-10-CM | POA: Diagnosis present

## 2023-08-05 DIAGNOSIS — Z952 Presence of prosthetic heart valve: Secondary | ICD-10-CM | POA: Insufficient documentation

## 2023-08-05 DIAGNOSIS — D151 Benign neoplasm of heart: Secondary | ICD-10-CM | POA: Diagnosis present

## 2023-08-05 DIAGNOSIS — I6521 Occlusion and stenosis of right carotid artery: Secondary | ICD-10-CM | POA: Diagnosis not present

## 2023-08-05 DIAGNOSIS — Z471 Aftercare following joint replacement surgery: Secondary | ICD-10-CM | POA: Diagnosis not present

## 2023-08-05 DIAGNOSIS — R531 Weakness: Secondary | ICD-10-CM | POA: Diagnosis not present

## 2023-08-05 DIAGNOSIS — E11621 Type 2 diabetes mellitus with foot ulcer: Secondary | ICD-10-CM | POA: Diagnosis not present

## 2023-08-05 DIAGNOSIS — E11649 Type 2 diabetes mellitus with hypoglycemia without coma: Secondary | ICD-10-CM | POA: Diagnosis not present

## 2023-08-05 DIAGNOSIS — G459 Transient cerebral ischemic attack, unspecified: Principal | ICD-10-CM

## 2023-08-05 DIAGNOSIS — R2990 Unspecified symptoms and signs involving the nervous system: Secondary | ICD-10-CM | POA: Diagnosis present

## 2023-08-05 DIAGNOSIS — I10 Essential (primary) hypertension: Secondary | ICD-10-CM | POA: Diagnosis not present

## 2023-08-05 DIAGNOSIS — M069 Rheumatoid arthritis, unspecified: Secondary | ICD-10-CM | POA: Insufficient documentation

## 2023-08-05 DIAGNOSIS — I2510 Atherosclerotic heart disease of native coronary artery without angina pectoris: Secondary | ICD-10-CM | POA: Insufficient documentation

## 2023-08-05 DIAGNOSIS — Z8673 Personal history of transient ischemic attack (TIA), and cerebral infarction without residual deficits: Secondary | ICD-10-CM | POA: Insufficient documentation

## 2023-08-05 DIAGNOSIS — Z7982 Long term (current) use of aspirin: Secondary | ICD-10-CM | POA: Insufficient documentation

## 2023-08-05 DIAGNOSIS — E1169 Type 2 diabetes mellitus with other specified complication: Secondary | ICD-10-CM | POA: Diagnosis present

## 2023-08-05 DIAGNOSIS — E1140 Type 2 diabetes mellitus with diabetic neuropathy, unspecified: Secondary | ICD-10-CM | POA: Insufficient documentation

## 2023-08-05 DIAGNOSIS — I6782 Cerebral ischemia: Secondary | ICD-10-CM | POA: Diagnosis not present

## 2023-08-05 DIAGNOSIS — E785 Hyperlipidemia, unspecified: Secondary | ICD-10-CM | POA: Diagnosis present

## 2023-08-05 DIAGNOSIS — F419 Anxiety disorder, unspecified: Secondary | ICD-10-CM | POA: Diagnosis present

## 2023-08-05 DIAGNOSIS — Z87891 Personal history of nicotine dependence: Secondary | ICD-10-CM | POA: Insufficient documentation

## 2023-08-05 DIAGNOSIS — Z951 Presence of aortocoronary bypass graft: Secondary | ICD-10-CM

## 2023-08-05 DIAGNOSIS — F32A Depression, unspecified: Secondary | ICD-10-CM | POA: Diagnosis present

## 2023-08-05 DIAGNOSIS — L89623 Pressure ulcer of left heel, stage 3: Secondary | ICD-10-CM | POA: Diagnosis not present

## 2023-08-05 DIAGNOSIS — E1142 Type 2 diabetes mellitus with diabetic polyneuropathy: Secondary | ICD-10-CM | POA: Diagnosis present

## 2023-08-05 DIAGNOSIS — I6930 Unspecified sequelae of cerebral infarction: Secondary | ICD-10-CM

## 2023-08-05 LAB — APTT: aPTT: 29 seconds (ref 24–36)

## 2023-08-05 LAB — CBC WITH DIFFERENTIAL/PLATELET
Abs Immature Granulocytes: 0.03 10*3/uL (ref 0.00–0.07)
Basophils Absolute: 0.1 10*3/uL (ref 0.0–0.1)
Basophils Relative: 1 %
Eosinophils Absolute: 0.1 10*3/uL (ref 0.0–0.5)
Eosinophils Relative: 1 %
HCT: 45.8 % (ref 36.0–46.0)
Hemoglobin: 15.2 g/dL — ABNORMAL HIGH (ref 12.0–15.0)
Immature Granulocytes: 0 %
Lymphocytes Relative: 18 %
Lymphs Abs: 1.4 10*3/uL (ref 0.7–4.0)
MCH: 27.5 pg (ref 26.0–34.0)
MCHC: 33.2 g/dL (ref 30.0–36.0)
MCV: 83 fL (ref 80.0–100.0)
Monocytes Absolute: 0.4 10*3/uL (ref 0.1–1.0)
Monocytes Relative: 5 %
Neutro Abs: 5.9 10*3/uL (ref 1.7–7.7)
Neutrophils Relative %: 75 %
Platelets: 274 10*3/uL (ref 150–400)
RBC: 5.52 MIL/uL — ABNORMAL HIGH (ref 3.87–5.11)
RDW: 13.2 % (ref 11.5–15.5)
WBC: 7.8 10*3/uL (ref 4.0–10.5)
nRBC: 0 % (ref 0.0–0.2)

## 2023-08-05 LAB — URINALYSIS, COMPLETE (UACMP) WITH MICROSCOPIC
Bilirubin Urine: NEGATIVE
Glucose, UA: NEGATIVE mg/dL
Ketones, ur: NEGATIVE mg/dL
Nitrite: NEGATIVE
Protein, ur: NEGATIVE mg/dL
Specific Gravity, Urine: 1.004 — ABNORMAL LOW (ref 1.005–1.030)
pH: 6 (ref 5.0–8.0)

## 2023-08-05 LAB — URINE DRUG SCREEN, QUALITATIVE (ARMC ONLY)
Amphetamines, Ur Screen: NOT DETECTED
Barbiturates, Ur Screen: NOT DETECTED
Benzodiazepine, Ur Scrn: POSITIVE — AB
Cannabinoid 50 Ng, Ur ~~LOC~~: NOT DETECTED
Cocaine Metabolite,Ur ~~LOC~~: NOT DETECTED
MDMA (Ecstasy)Ur Screen: NOT DETECTED
Methadone Scn, Ur: NOT DETECTED
Opiate, Ur Screen: NOT DETECTED
Phencyclidine (PCP) Ur S: NOT DETECTED
Tricyclic, Ur Screen: NOT DETECTED

## 2023-08-05 LAB — COMPREHENSIVE METABOLIC PANEL
ALT: 6 U/L (ref 0–44)
AST: 12 U/L — ABNORMAL LOW (ref 15–41)
Albumin: 3.9 g/dL (ref 3.5–5.0)
Alkaline Phosphatase: 50 U/L (ref 38–126)
Anion gap: 8 (ref 5–15)
BUN: 14 mg/dL (ref 8–23)
CO2: 26 mmol/L (ref 22–32)
Calcium: 9.3 mg/dL (ref 8.9–10.3)
Chloride: 99 mmol/L (ref 98–111)
Creatinine, Ser: 0.9 mg/dL (ref 0.44–1.00)
GFR, Estimated: >60 mL/min (ref >60)
Glucose, Bld: 274 mg/dL — ABNORMAL HIGH (ref 70–99)
Potassium: 3.6 mmol/L (ref 3.5–5.1)
Sodium: 133 mmol/L — ABNORMAL LOW (ref 135–145)
Total Bilirubin: 0.6 mg/dL (ref 0.3–1.2)
Total Protein: 8.4 g/dL — ABNORMAL HIGH (ref 6.5–8.1)

## 2023-08-05 LAB — CBG MONITORING, ED
Glucose-Capillary: 252 mg/dL — ABNORMAL HIGH (ref 70–99)
Glucose-Capillary: 182 mg/dL — ABNORMAL HIGH (ref 70–99)

## 2023-08-05 LAB — PROTIME-INR
INR: 1 (ref 0.8–1.2)
Prothrombin Time: 13.5 seconds (ref 11.4–15.2)

## 2023-08-05 LAB — ETHANOL: Alcohol, Ethyl (B): <10 mg/dL (ref <10)

## 2023-08-05 MED ORDER — ACETAMINOPHEN 325 MG RE SUPP: 650.0000 mg | RECTAL | Status: DC | PRN

## 2023-08-05 MED ORDER — OXYBUTYNIN CHLORIDE ER 10 MG PO TB24
10.0000 mg | ORAL_TABLET | Freq: Every day | ORAL | Status: DC
  Administered 2023-08-06: 10 mg via ORAL
  Filled 2023-08-05: qty 1

## 2023-08-05 MED ORDER — HYDRALAZINE HCL 20 MG/ML IJ SOLN
5.0000 mg | Freq: Four times a day (QID) | INTRAMUSCULAR | Status: DC | PRN
  Administered 2023-08-05: 5 mg via INTRAVENOUS
  Filled 2023-08-05: qty 1

## 2023-08-05 MED ORDER — STROKE: EARLY STAGES OF RECOVERY BOOK: Freq: Once | Status: DC

## 2023-08-05 MED ORDER — ALPRAZOLAM 0.5 MG PO TABS: 0.5000 mg | ORAL_TABLET | Freq: Two times a day (BID) | ORAL | Status: DC | PRN

## 2023-08-05 MED ORDER — ASPIRIN 81 MG PO TBEC
81.0000 mg | DELAYED_RELEASE_TABLET | Freq: Every day | ORAL | Status: DC
  Administered 2023-08-06: 81 mg via ORAL
  Filled 2023-08-05: qty 1

## 2023-08-05 MED ORDER — ROSUVASTATIN CALCIUM 20 MG PO TABS
40.0000 mg | ORAL_TABLET | Freq: Every day | ORAL | Status: DC
  Administered 2023-08-06: 40 mg via ORAL
  Filled 2023-08-05: qty 2

## 2023-08-05 MED ORDER — ACETAMINOPHEN 325 MG PO TABS: 650.0000 mg | ORAL_TABLET | ORAL | Status: DC | PRN

## 2023-08-05 MED ORDER — SERTRALINE HCL 50 MG PO TABS
25.0000 mg | ORAL_TABLET | Freq: Every day | ORAL | Status: DC
  Administered 2023-08-05: 25 mg via ORAL
  Filled 2023-08-05: qty 1

## 2023-08-05 MED ORDER — INSULIN ASPART 100 UNIT/ML IJ SOLN
0.0000 [IU] | Freq: Three times a day (TID) | INTRAMUSCULAR | Status: DC
  Administered 2023-08-06 (×2): 3 [IU] via SUBCUTANEOUS
  Administered 2023-08-06: 2 [IU] via SUBCUTANEOUS
  Filled 2023-08-05 (×3): qty 1

## 2023-08-05 MED ORDER — ACETAMINOPHEN 160 MG/5ML PO SOLN: 650.0000 mg | ORAL | Status: DC | PRN

## 2023-08-05 MED ORDER — SENNOSIDES-DOCUSATE SODIUM 8.6-50 MG PO TABS: 1.0000 | ORAL_TABLET | Freq: Every evening | ORAL | Status: DC | PRN

## 2023-08-05 MED ORDER — INSULIN ASPART 100 UNIT/ML IJ SOLN: 0.0000 [IU] | Freq: Every day | INTRAMUSCULAR | Status: DC

## 2023-08-05 NOTE — Assessment & Plan Note (Signed)
>>  ASSESSMENT AND PLAN FOR HYPERCHOLESTEROLEMIA WITHOUT HYPERTRIGLYCERIDEMIA WRITTEN ON 10/13/2023 12:16 PM BY Malva Limes, MD  >>ASSESSMENT AND PLAN FOR HYPERLIPIDEMIA ASSOCIATED WITH TYPE 2 DIABETES MELLITUS (HCC) WRITTEN ON 08/05/2023  6:35 PM BY COX, AMY N, DO  Rosuvastatin 40 mg daily resumed

## 2023-08-05 NOTE — Assessment & Plan Note (Signed)
-   Rosuvastatin 40 mg daily resumed 

## 2023-08-05 NOTE — ED Provider Notes (Signed)
[facility] Provider Note    Event Date/Time   First MD Initiated Contact with Patient 08/05/23 1359     (approximate)   History   Chief Complaint Stroke Symptoms (At Wound clinic appt this morning, noted BP 200/100s, after appt daughter states she was having difficulty getting words out and slurring starting at 10am. Hx of CVA [id number]. Does has residual weakness on L side from CVA. Denies CP or shortness of breath)   HPI  [name] is a 67 y.o. female with a past medical history of hypertension, diabetes, hyperlipidemia, CAD status post CABG, and stroke with left-sided deficits who presents to the ED complaining of aphasia.  Patient reports that she went to her regular appointment with wound care and was found to have elevated blood pressure while there.  She states she was feeling fine at the time, but on the way home from her appointment began to have difficulty speaking.  She states that she "could not get words out" and had a hard time ordering when she attempted to use the drive in at [facility].  Symptoms lasted for at least an hour before patient decided to seek care in the ED.  She now states that her speech seems back to normal and she denies any associated numbness or weakness in her extremities with this.  She does report chronic left arm and leg weakness from prior stroke that was unchanged with the episode today.     Physical Exam   Triage Vital Signs: ED Triage Vitals  Encounter Vitals Group     BP 08/05/23 1357 (!) 174/110     Systolic BP Percentile --      Diastolic BP Percentile --      Pulse Rate 08/05/23 1357 91     Resp 08/05/23 1357 18     Temp 08/05/23 1357 97.9 F (36.6 C)     Temp Source 08/05/23 1357 Oral     SpO2 08/05/23 1357 96 %     Weight --      Height --      Head Circumference --      [place] Flow --      Pain Score 08/05/23 1351 3     Pain Loc --      Pain Education --      Exclude from Growth Chart --     Most  recent vital signs: Vitals:   08/05/23 1357 08/05/23 1415  BP: (!) 174/110 (!) 199/99  Pulse: 91 86  Resp: 18   Temp: 97.9 F (36.6 C)   SpO2: 96% 100%    Constitutional: Alert and oriented. Eyes: Conjunctivae are normal. Head: Atraumatic. Nose: No congestion/rhinnorhea. Mouth/Throat: Mucous membranes are moist.  Cardiovascular: Normal rate, regular rhythm. Grossly normal heart sounds.  2+ radial pulses bilaterally. Respiratory: Normal respiratory effort.  No retractions. Lungs CTAB. Gastrointestinal: Soft and nontender. No distention. Musculoskeletal: No lower extremity tenderness nor edema.  Neurologic:  Normal speech and language.  4+ out of 5 strength in the left upper and lower extremities, chronic per patient.  5 out of 5 strength in right upper and lower extremities.    ED Results / Procedures / Treatments   Labs (all labs ordered are listed, but only abnormal results are displayed) Labs Reviewed  CBC WITH DIFFERENTIAL/PLATELET - Abnormal; Notable for the following components:      Result Value   RBC 5.52 (*)    Hemoglobin 15.2 (*)    All other  components within normal limits  COMPREHENSIVE METABOLIC PANEL - Abnormal; Notable for the following components:   Sodium 133 (*)    Glucose, Bld 274 (*)    Total Protein 8.4 (*)    AST 12 (*)    All other components within normal limits  CBG MONITORING, ED - Abnormal; Notable for the following components:   Glucose-Capillary 252 (*)    All other components within normal limits  ETHANOL  PROTIME-INR  APTT  URINALYSIS, COMPLETE (UACMP) WITH MICROSCOPIC  URINE DRUG SCREEN, QUALITATIVE ([facility] ONLY)     EKG  ED ECG REPORT I, Chesley Noon, the attending physician, personally viewed and interpreted this ECG.   Date: 08/05/2023  EKG Time: 14:10  Rate: 93  Rhythm: normal sinus rhythm  Axis: LAD  Intervals:left anterior fascicular block  ST&T Change: None  RADIOLOGY CT head reviewed and interpreted by me with no  hemorrhage or midline shift.  PROCEDURES:  Critical Care performed: No  Procedures   MEDICATIONS ORDERED IN ED: Medications - No data to display   IMPRESSION / MDM / ASSESSMENT AND PLAN / ED COURSE  I reviewed the triage vital signs and the nursing notes.                              67 y.o. female with past medical history of hypertension, hyperlipidemia, diabetes, CAD status post CABG, and stroke with left-sided deficits who presents to the ED with difficulty speaking and getting words out starting around 10:00 this morning, since resolved.  Patient's presentation is most consistent with acute presentation with potential threat to life or bodily function.  Differential diagnosis includes, but is not limited to, stroke, TIA, anemia, electrolyte abnormality, AKI, UTI.  Patient nontoxic-appearing and in no acute distress, vital signs remarkable for elevated blood pressure but otherwise reassuring.  Patient has no focal neurologic deficits on exam, speech appears normal at this time and patient states she feels back to normal.  With resolving symptoms, I am concerned for TIA and we will further assess with CT head and labs.  Labs without significant anemia, leukocytosis, tract abnormality, or AKI.  LFTs and coags are also unremarkable.  Patient turned over to oncoming divider pending CT head results, anticipate admission for further TIA workup.      FINAL CLINICAL IMPRESSION(S) / ED DIAGNOSES   Final diagnoses:  TIA (transient ischemic attack)     Rx / DC Orders   ED Discharge Orders     None        Note:  This document was prepared using Dragon voice recognition software and may include unintentional dictation errors.   Chesley Noon, MD 08/05/23 [phone]

## 2023-08-05 NOTE — Assessment & Plan Note (Signed)
Alprazolam 0.5 mg p.o. twice daily as needed for anxiety Sertraline 25 mg nightly resumed

## 2023-08-05 NOTE — Assessment & Plan Note (Signed)
>>  ASSESSMENT AND PLAN FOR HYPERLIPIDEMIA ASSOCIATED WITH TYPE 2 DIABETES MELLITUS (HCC) WRITTEN ON 08/05/2023  6:35 PM BY COX, AMY N, DO  Rosuvastatin 40 mg daily resumed

## 2023-08-05 NOTE — Assessment & Plan Note (Signed)
Stroke-like symptoms Neurology has been consulted via staff message for 8/23 and we appreciate further recommendations Complete echo MRI of the brain Fasting lipid  Frequent neuro vascular checks Heart healthy/carb modified PT, OT, SLP Fall precaution

## 2023-08-05 NOTE — Assessment & Plan Note (Signed)
Mild and within baseline range for patient

## 2023-08-05 NOTE — Assessment & Plan Note (Signed)
Aspirin 81 mg daily, rosuvastatin 40 mg daily resumed on admission

## 2023-08-05 NOTE — H&P (Addendum)
History and Physical   [name] HYQ:[id number] DOB: 1956-05-03 DOA: 08/05/2023  PCP: Kimberly Limes, MD  Outpatient Specialists: Dr. Martha [name], orthopedic surgeon Patient coming from: Home  I have personally briefly reviewed patient's old medical records in [facility] EMR.  Chief Concern: Difficulty speaking  HPI: [name] is a 67 year old female with prior history of a stroke, with left-sided deficit, hyperlipidemia, hypertension, non-insulin-dependent diabetes mellitus, CAD status post CABG, presents to the emergency department for chief concerns of stroke-like symptoms.  Vitals in the ED showed temperature of 97.9, respiration rate of 18, heart rate of 91, blood pressure 174/110, SpO2 of 96% on room air.  Serum sodium is 133, potassium 3.6, chloride 99, bicarb 26, BUN of 48, serum creatinine 0.90, EGFR greater than 60, nonfasting blood glucose 274, WBC 7.8, hemoglobin 15.2, platelets of 274.  CT of the head without contrast: Read as negative for intracranial hemorrhage or mass.  Age-indeterminate hypodensity/positive infarct within the right white matter.  MRI could be obtained for further evaluation.  Chronic thalamic infarct and minimal chronic small vessel ischemic changes of the white matter.  ED treatment: None -------------------------------- At bedside patient was able to tell me her name, age, location, current calendar year.  She reports that she was at the doctor's office today and had elevated blood pressure over 200.  She reports that she felt fine at the clinic.  On her way home, her daughter drove her to [name] to get drive-through, while at [facility], she had difficulty getting the words out.  She knew what she was thinking about however she could not seem to form the words.  She denies vision changes.  They drove home and she ambulated with her walker like normal up the steps to her home, and laid down on the couch.  The symptoms lasted about an  hour and then the patient decided to come to the emergency department for further evaluation.  She denies trauma to her person, chest pain, shortness of breath, abdominal pain, dysuria, hematuria, diarrhea.  She reports this feels similar to an episode that happened last year.  Reports that she has a history of embolic stroke in 2022 please secondary to blood clot and a cancer in her heart that required open heart surgery and resection of the mass.  She endorses infrequent use of aspirin daily.  Social history: Patient lives at home.  She denies tobacco, EtOH, recreational drug use.  ROS: Constitutional: no weight change, no fever ENT/Mouth: no sore throat, no rhinorrhea Eyes: no eye pain, no vision changes Cardiovascular: no chest pain, no dyspnea,  no edema, no palpitations Respiratory: no cough, no sputum, no wheezing Gastrointestinal: no nausea, no vomiting, no diarrhea, no constipation Genitourinary: no urinary incontinence, no dysuria, no hematuria Musculoskeletal: no arthralgias, no myalgias Skin: no skin lesions, no pruritus, Neuro: + weakness, no loss of consciousness, no syncope, + difficulty speaking Psych: no anxiety, no depression, no decrease appetite Heme/Lymph: no bruising, no bleeding  ED Course: Discussed with emergency medicine provider, patient requiring hospitalization for chief concerns of strokelike symptoms.  Assessment/Plan  Principal Problem:   Stroke-like symptom Active Problems:   Hyponatremia   Anxiety and depression   History of CVA with residual deficit   Papillary fibroelastoma of heart   S/P CABG x 4   Diabetic peripheral neuropathy (HCC)   Hyperlipidemia associated with type 2 diabetes mellitus (HCC)   Assessment and Plan:  * Stroke-like symptom Stroke-like symptoms Neurology has been consulted via  staff message for 8/23 and we appreciate further recommendations Complete echo MRI of the brain Fasting lipid  Frequent neuro vascular  checks Heart healthy/carb modified PT, OT, SLP Fall precaution  Hyponatremia Mild and within baseline range for patient  Anxiety and depression Alprazolam 0.5 mg p.o. twice daily as needed for anxiety Sertraline 25 mg nightly resumed  Hyperlipidemia associated with type 2 diabetes mellitus (HCC) Rosuvastatin 40 mg daily resumed  S/P CABG x 4 Aspirin 81 mg daily, rosuvastatin 40 mg daily resumed on admission  Chart reviewed.   Patient was admitted from 08/04/2021 to 08/11/2021: For papillary fibroelastoma of the aortic valve and coronary artery disease.  Patient is status post median sternotomy, extracorporeal circulation, resection of fibroelastoma from aortic valve, endoscopic vein harvest of the right leg, coronary artery disease by past grafting x 4 (left internal mammary artery to LAD, sequential saphenous vein graft to diagonal 1, obtuse marginal and saphenous vein graft to posterior descending)  May 2022: She was found to have a small right thalamic infarct and occlusion of the segmental branch of the right posterior cerebral artery.  Patient is status post retrieval of the embolus by IR.  A TEE was done that showed normal left ventricular function with large mobile mass on the aortic valve consistent with papillary fibroblastoma.  There is no aortic stenosis or insufficiency.  DVT prophylaxis: TED hose a.m. team to initiate pharmacologic DVT prophylaxis when the benefits outweigh the risk Code Status: Full code Diet: Heart healthy/carb modified Family Communication: Updated daughter [name] on speaker with patient's phone Disposition Plan: Pending clinical course Consults called: Neurology Admission status: Telemetry medical, observation  Past Medical History:  Diagnosis Date   Anxiety    Arthritis    CAD (coronary artery disease)    a. 3 vessel CAD noted on coronary CTA 04/2021, s/p CABG x4   Cataract    left   Depression    Diabetes mellitus without complication (HCC)     type 2   Environmental and seasonal allergies    GERD (gastroesophageal reflux disease)    Hyperlipidemia    Hypertension    Hypertensive urgency 04/18/2021   Hypoglycemia 06/09/2023   Joint pain    as reported by patient   Papillary fibroelastoma of heart    of the aortic valve,  s/p AVR and tumor resection on 08/04/2021   Right thalamic infarction (HCC) 04/29/2021   S/P AVR (aortic valve replacement)    s/p AVR with resection of AV fibroelastoma on 08/04/2021   S/P CABG x 4    a. s/p CABG x4 LIMA to LAD, SVG to PDA, and Sequential SVG to OM and Diagonal on 8/   Stroke (HCC) 04/2021   admitted at [facility] - left sided weakness   Urinary incontinence    as stated by patient   Past Surgical History:  Procedure Laterality Date   AORTIC VALVE REPLACEMENT N/A 08/04/2021   Procedure: RESECTION AORTIC VALVE TUMOR;  Surgeon: Loreli Slot, MD;  Location: [facility] OR;  Service: Open Heart Surgery;  Laterality: N/A;   APPLICATION OF WOUND VAC N/A 08/24/2021   Procedure: APPLICATION OF WOUND VAC;  Surgeon: Alleen Borne, MD;  Location: [facility] OR;  Service: Thoracic;  Laterality: N/A;   APPLICATION OF WOUND VAC N/A 08/28/2021   Procedure: WOUND VAC CHANGE;  Surgeon: Loreli Slot, MD;  Location: [facility] OR;  Service: Thoracic;  Laterality: N/A;   BREAST EXCISIONAL BIOPSY Right 1999   neg   BUBBLE STUDY  04/24/2021   Procedure: BUBBLE STUDY;  Surgeon: Quintella Reichert, MD;  Location: [facility] ENDOSCOPY;  Service: Cardiovascular;;   CATARACT EXTRACTION  12/2011   CATARACT EXTRACTION W/PHACO Left 12/02/2016   Procedure: CATARACT EXTRACTION PHACO AND INTRAOCULAR LENS PLACEMENT (IOC);  Surgeon: Sallee Lange, MD;  Location: [facility] ORS;  Service: Ophthalmology;  Laterality: Left;  [place] 2:14 AP% 28.4 CDE 59.73 Fluid pack lot # [id number]   COLONOSCOPY WITH PROPOFOL N/A 04/13/2022   Procedure: COLONOSCOPY WITH PROPOFOL;  Surgeon: Toney Reil, MD;  Location: [facility] ENDOSCOPY;  Service:  Gastroenterology;  Laterality: N/A;   CORONARY ARTERY BYPASS GRAFT N/A 08/04/2021   Procedure: CORONARY ARTERY BYPASS GRAFTING (CABG) X  4  USING LEFT INTERNAL MAMMARY ARTERY AND RIGHT GREATER SAPHENOUS VEIN ENDOSCOPIC CONDUITS;  Surgeon: Loreli Slot, MD;  Location: [facility] OR;  Service: Open Heart Surgery;  Laterality: N/A;   DIAGNOSTIC LAPAROSCOPY     EYE SURGERY     HIP ARTHROPLASTY Left 06/02/2023   Procedure: ARTHROPLASTY BIPOLAR HIP (HEMIARTHROPLASTY);  Surgeon: Juanell Fairly, MD;  Location: [facility] ORS;  Service: Orthopedics;  Laterality: Left;   IR CT HEAD LTD  04/19/2021   IR PERCUTANEOUS ART THROMBECTOMY/INFUSION INTRACRANIAL INC DIAG ANGIO  04/19/2021       IR PERCUTANEOUS ART THROMBECTOMY/INFUSION INTRACRANIAL INC DIAG ANGIO  04/19/2021   IR US GUIDE VASC ACCESS LEFT  04/19/2021   RADIOLOGY WITH ANESTHESIA N/A 04/19/2021   Procedure: IR WITH ANESTHESIA;  Surgeon: Julieanne Cotton, MD;  Location: [facility] OR;  Service: Radiology;  Laterality: N/A;   STERNAL WOUND DEBRIDEMENT N/A 08/24/2021   Procedure: STERNAL WOUND DRAINAGE AND DEBRIDEMENT;  Surgeon: Alleen Borne, MD;  Location: [facility] OR;  Service: Thoracic;  Laterality: N/A;   STERNAL WOUND DEBRIDEMENT N/A 08/28/2021   Procedure: STERNAL WOUND DEBRIDEMENT;  Surgeon: Loreli Slot, MD;  Location: [facility] OR;  Service: Thoracic;  Laterality: N/A;   TEE WITHOUT CARDIOVERSION N/A 04/24/2021   Procedure: TRANSESOPHAGEAL ECHOCARDIOGRAM (TEE);  Surgeon: Quintella Reichert, MD;  Location: [facility] ENDOSCOPY;  Service: Cardiovascular;  Laterality: N/A;   TEE WITHOUT CARDIOVERSION N/A 08/04/2021   Procedure: TRANSESOPHAGEAL ECHOCARDIOGRAM (TEE);  Surgeon: Loreli Slot, MD;  Location: [facility] OR;  Service: Open Heart Surgery;  Laterality: N/A;   TOTAL HIP ARTHROPLASTY Right 04/04/2018   Procedure: TOTAL HIP ARTHROPLASTY;  Surgeon: Donato Heinz, MD;  Location: [facility] ORS;  Service: Orthopedics;  Laterality: Right;   Social History:  reports  that she quit smoking about 16 years ago. Her smoking use included cigarettes. She started smoking about 41 years ago. She has a 37.5 pack-year smoking history. She has never used smokeless tobacco. She reports that she does not currently use alcohol. She reports that she does not use drugs.  Allergies  Allergen Reactions   Jardiance [Empagliflozin] Itching    Recurrent mycotic infections   Penicillins Rash and Other (See Comments)    Has patient had a PCN reaction causing immediate rash, facial/tongue/throat swelling, SOB or lightheadedness with hypotension: No Has patient had a PCN reaction causing severe rash involving mucus membranes or skin necrosis: No Has patient had a PCN reaction that required hospitalization No Has patient had a PCN reaction occurring within the last 10 years: No If all of the above answers are "NO", then may proceed with Cephalosporin use.    Family History  Problem Relation Age of Onset   Bone cancer Brother    Aneurysm Mother        brain   Diabetes Father  CAD Father    Heart failure Father    Colon cancer Father    Cancer Father    Alzheimer's disease Paternal Grandmother    Dementia Paternal Grandmother    Mental illness Paternal Grandfather    Healthy Daughter    Healthy Son    Breast cancer Neg Hx    Family history: Family history reviewed and not pertinent  Prior to Admission medications   Medication Sig Start Date End Date Taking? Authorizing Provider  acetaminophen (TYLENOL) 500 MG tablet Take 2 tablets (1,000 mg total) by mouth every 8 (eight) hours. 06/11/23   Pennie Banter, DO  ALPRAZolam Prudy Feeler) 0.5 MG tablet Take 1 tablet (0.5 mg total) by mouth 2 (two) times daily as needed for anxiety 06/11/23   Esaw Grandchild A, DO  aspirin 81 MG EC tablet Take 1 tablet (81 mg total) by mouth daily. Swallow whole. 03/09/22   Kimberly Limes, MD  Blood Glucose Monitoring Suppl (BLOOD GLUCOSE MONITOR SYSTEM) w/Device KIT 1 each by Does not apply  route in the morning, at noon, and at bedtime. 03/22/23   Kimberly Limes, MD  Blood Glucose Monitoring Suppl [facility] VERIO REFLECT) w/Device KIT Use as directed. 08/03/23   Kimberly Limes, MD  empagliflozin (JARDIANCE) 10 MG TABS tablet Take 1 tablet (10 mg total) by mouth daily before breakfast. 10/17/22   Merita Norton T, FNP  glucose blood (FREESTYLE LITE) test strip Use to check blood sugar up to 4 (four) times daily as needed for diabetes. 08/03/23   Kimberly Limes, MD  glyBURIDE (DIABETA) 5 MG tablet Take 2 tablets (10 mg total) by mouth 2 (two) times daily with a meal. 10/17/22   Jacky Kindle, FNP  latanoprost (XALATAN) 0.005 % ophthalmic solution Place 1 drop into both eyes Nightly. 03/11/23     meloxicam (MOBIC) 7.5 MG tablet Take 1 tablet (7.5 mg total) by mouth 2 (two) times daily. 07/21/23     metFORMIN (GLUCOPHAGE-XR) 500 MG 24 hr tablet Take 2 tablets (1,000 mg total) by mouth 2 (two) times daily with a meal. 10/17/22   Jacky Kindle, FNP  Multiple Vitamin (MULTIVITAMIN) tablet Take 1 tablet by mouth daily.    [provider]  oxybutynin (DITROPAN-XL) 10 MG 24 hr tablet Take 1 tablet (10 mg total) by mouth daily. 02/01/23   Vanna Scotland, MD  rosuvastatin (CRESTOR) 40 MG tablet Take 1 tablet (40 mg total) by mouth daily. 10/17/22   Jacky Kindle, FNP  sertraline (ZOLOFT) 25 MG tablet Take 1 tablet by mouth at bedtime. 07/07/22   Horton Chin, MD   Physical Exam: Vitals:   08/05/23 1744 08/05/23 1800 08/05/23 1830 08/05/23 1900  BP: (!) 189/86 (!) 132/110 (!) 145/100 (!) 182/104  Pulse:  91 94 96  Resp:    18  Temp:      TempSrc:      SpO2:  99% 99% 100%   Constitutional: appears age-appropriate, frail, NAD, calm Eyes: PERRL, lids and conjunctivae normal ENMT: Mucous membranes are moist. Posterior pharynx clear of any exudate or lesions. Age-appropriate dentition. Hearing appropriate/ Neck: normal, supple, no masses, no thyromegaly Respiratory: clear to  auscultation bilaterally, no wheezing, no crackles. Normal respiratory effort. No accessory muscle use.  Cardiovascular: Regular rate and rhythm, no murmurs / rubs / gallops. No extremity edema. 2+ pedal pulses. No carotid bruits.  Abdomen: no tenderness, no masses palpated, no hepatosplenomegaly. Bowel sounds positive.  Musculoskeletal: no clubbing / cyanosis. No joint  deformity upper and lower extremities.  Left upper and lower extremity weakness Skin: no rashes, lesions, ulcers. No induration Neurologic: Sensation intact. Strength is on the left side Psychiatric: Normal judgment and insight. Alert and oriented x 3. Normal mood.   EKG: independently reviewed, showing sinus rhythm with rate of 93, QTc 483  Chest x-ray on Admission: I personally reviewed and I agree with radiologist reading as below.  CT HEAD WO CONTRAST  Result Date: 08/05/2023 CLINICAL DATA:  History of stroke, aphasia chronic left arm and leg weakness from prior stroke according to epic notes EXAM: CT HEAD WITHOUT CONTRAST TECHNIQUE: Contiguous axial images were obtained from the base of the skull through the vertex without intravenous contrast. RADIATION DOSE REDUCTION: This exam was performed according to the departmental dose-optimization program which includes automated exposure control, adjustment of the mA and/or kV according to patient size and/or use of iterative reconstruction technique. COMPARISON:  CT brain 08/22/2021, MRI 04/19/2021 FINDINGS: Brain: No acute territorial infarction, hemorrhage or intracranial mass. Chronic appearing thalamic infarcts. Age indeterminate hypodensity within the right white matter on series 2, image 16 through 18. No midline shift. No mass effect. Stable ventricle size. Minimal chronic small vessel ischemic changes of the white matter. Vascular: No hyperdense vessels.  Carotid vascular calcification. Skull: Normal. Negative for fracture or focal lesion. Sinuses/Orbits: No acute finding.  Other: None IMPRESSION: 1. Negative for intracranial hemorrhage or mass. 2. Age indeterminate hypodensity/possible infarct within the right white matter, MRI could be obtained for further evaluation. 3. Chronic thalamic infarcts and minimal chronic small vessel ischemic changes of the white matter. Electronically Signed   By: Jasmine Pang M.D.   On: 08/05/2023 15:25    Labs on Admission: I have personally reviewed following labs  CBC: Recent Labs  Lab 08/05/23 1401  WBC 7.8  NEUTROABS 5.9  HGB 15.2*  HCT 45.8  MCV 83.0  PLT 274   Basic Metabolic Panel: Recent Labs  Lab 08/05/23 1401  NA 133*  K 3.6  CL 99  CO2 26  GLUCOSE 274*  BUN 14  CREATININE 0.90  CALCIUM 9.3   GFR: CrCl cannot be calculated (Unknown ideal weight.).  Liver Function Tests: Recent Labs  Lab 08/05/23 1401  AST 12*  ALT 6  ALKPHOS 50  BILITOT 0.6  PROT 8.4*  ALBUMIN 3.9   Coagulation Profile: Recent Labs  Lab 08/05/23 1431  INR 1.0   CBG: Recent Labs  Lab 08/05/23 1405  GLUCAP 252*   Urine analysis:    Component Value Date/Time   COLORURINE STRAW (A) 08/05/2023 1740   APPEARANCEUR CLEAR (A) 08/05/2023 1740   APPEARANCEUR Cloudy (A) 12/23/2022 0938   LABSPEC 1.004 (L) 08/05/2023 1740   PHURINE 6.0 08/05/2023 1740   GLUCOSEU NEGATIVE 08/05/2023 1740   HGBUR SMALL (A) 08/05/2023 1740   BILIRUBINUR NEGATIVE 08/05/2023 1740   BILIRUBINUR Negative 12/23/2022 0938   KETONESUR NEGATIVE 08/05/2023 1740   PROTEINUR NEGATIVE 08/05/2023 1740   UROBILINOGEN 1.0 11/16/2019 1516   NITRITE NEGATIVE 08/05/2023 1740   LEUKOCYTESUR SMALL (A) 08/05/2023 1740   This document was prepared using Dragon Voice Recognition software and may include unintentional dictation errors.  Dr. Sedalia Muta [facility]  If 7PM-7AM, please contact overnight-coverage provider If 7AM-7PM, please contact day attending provider [email or web address]  08/05/2023, 7:52 PM

## 2023-08-05 NOTE — Hospital Course (Addendum)
[name] is a 67 year old female with prior history of a stroke, with left-sided deficit, hyperlipidemia, hypertension, non-insulin-dependent diabetes mellitus, CAD status post CABG, presents to the emergency department for chief concerns of stroke-like symptoms.  Vitals in the ED showed temperature of 97.9, respiration rate of 18, heart rate of 91, blood pressure 174/110, SpO2 of 96% on room air.  Serum sodium is 133, potassium 3.6, chloride 99, bicarb 26, BUN of 48, serum creatinine 0.90, EGFR greater than 60, nonfasting blood glucose 274, WBC 7.8, hemoglobin 15.2, platelets of 274.  CT of the head without contrast: Read as negative for intracranial hemorrhage or mass.  Age-indeterminate hypodensity/positive infarct within the right white matter.  MRI could be obtained for further evaluation.  Chronic thalamic infarct and minimal chronic small vessel ischemic changes of the white matter.  ED treatment: None

## 2023-08-06 ENCOUNTER — Encounter: Payer: Self-pay | Admitting: Internal Medicine

## 2023-08-06 ENCOUNTER — Other Ambulatory Visit: Payer: Self-pay

## 2023-08-06 ENCOUNTER — Observation Stay: Payer: PPO

## 2023-08-06 ENCOUNTER — Observation Stay (HOSPITAL_BASED_OUTPATIENT_CLINIC_OR_DEPARTMENT_OTHER)
Admit: 2023-08-06 | Discharge: 2023-08-06 | Disposition: A | Discharge status: Home / Self Care | Payer: PPO | Attending: Internal Medicine | Admitting: Internal Medicine

## 2023-08-06 DIAGNOSIS — G459 Transient cerebral ischemic attack, unspecified: Secondary | ICD-10-CM

## 2023-08-06 DIAGNOSIS — I6523 Occlusion and stenosis of bilateral carotid arteries: Secondary | ICD-10-CM | POA: Diagnosis not present

## 2023-08-06 DIAGNOSIS — I708 Atherosclerosis of other arteries: Secondary | ICD-10-CM | POA: Diagnosis not present

## 2023-08-06 DIAGNOSIS — R2990 Unspecified symptoms and signs involving the nervous system: Secondary | ICD-10-CM | POA: Diagnosis not present

## 2023-08-06 DIAGNOSIS — I6623 Occlusion and stenosis of bilateral posterior cerebral arteries: Secondary | ICD-10-CM | POA: Diagnosis not present

## 2023-08-06 DIAGNOSIS — I672 Cerebral atherosclerosis: Secondary | ICD-10-CM | POA: Diagnosis not present

## 2023-08-06 LAB — CBG MONITORING, ED
Glucose-Capillary: 147 mg/dL — ABNORMAL HIGH (ref 70–99)
Glucose-Capillary: 166 mg/dL — ABNORMAL HIGH (ref 70–99)
Glucose-Capillary: 174 mg/dL — ABNORMAL HIGH (ref 70–99)

## 2023-08-06 LAB — CBC
HCT: 41.9 % (ref 36.0–46.0)
Hemoglobin: 14.1 g/dL (ref 12.0–15.0)
MCH: 27.8 pg (ref 26.0–34.0)
MCHC: 33.7 g/dL (ref 30.0–36.0)
MCV: 82.5 fL (ref 80.0–100.0)
Platelets: 280 10*3/uL (ref 150–400)
RBC: 5.08 MIL/uL (ref 3.87–5.11)
RDW: 13.3 % (ref 11.5–15.5)
WBC: 8.5 10*3/uL (ref 4.0–10.5)
nRBC: 0 % (ref 0.0–0.2)

## 2023-08-06 LAB — LIPID PANEL
Cholesterol: 248 mg/dL — ABNORMAL HIGH (ref 0–200)
HDL: 40 mg/dL — ABNORMAL LOW (ref >40)
LDL Cholesterol: 184 mg/dL — ABNORMAL HIGH (ref 0–99)
Total CHOL/HDL Ratio: 6.2 RATIO
Triglycerides: 121 mg/dL (ref <150)
VLDL: 24 mg/dL (ref 0–40)

## 2023-08-06 LAB — PHOSPHORUS: Phosphorus: 4.1 mg/dL (ref 2.5–4.6)

## 2023-08-06 LAB — BASIC METABOLIC PANEL
Anion gap: 10 (ref 5–15)
BUN: 17 mg/dL (ref 8–23)
CO2: 25 mmol/L (ref 22–32)
Calcium: 9.5 mg/dL (ref 8.9–10.3)
Chloride: 98 mmol/L (ref 98–111)
Creatinine, Ser: 0.81 mg/dL (ref 0.44–1.00)
GFR, Estimated: >60 mL/min (ref >60)
Glucose, Bld: 179 mg/dL — ABNORMAL HIGH (ref 70–99)
Potassium: 4 mmol/L (ref 3.5–5.1)
Sodium: 133 mmol/L — ABNORMAL LOW (ref 135–145)

## 2023-08-06 LAB — ECHOCARDIOGRAM COMPLETE
AR max vel: 1.83 cm2
AV Area VTI: 2.12 cm2
AV Area mean vel: 1.73 cm2
AV Mean grad: 3.3 mmHg
AV Peak grad: 5.7 mmHg
Ao pk vel: 1.19 m/s
Area-P 1/2: 4.96 cm2
Height: 67 in
MV VTI: 1.74 cm2
S' Lateral: 2.8 cm

## 2023-08-06 LAB — MAGNESIUM: Magnesium: 1.8 mg/dL (ref 1.7–2.4)

## 2023-08-06 MED ORDER — CLOPIDOGREL BISULFATE 75 MG PO TABS
75.0000 mg | ORAL_TABLET | Freq: Every day | ORAL | 0 refills | Status: DC
  Filled 2023-08-06: qty 21, 21d supply, fill #0

## 2023-08-06 MED ORDER — CLOPIDOGREL BISULFATE 75 MG PO TABS
75.0000 mg | ORAL_TABLET | Freq: Every day | ORAL | 0 refills | Status: AC
  Filled 2023-08-06 – 2023-08-07 (×3): qty 21, 21d supply, fill #0

## 2023-08-06 MED ORDER — CLOPIDOGREL BISULFATE 75 MG PO TABS
300.0000 mg | ORAL_TABLET | Freq: Once | ORAL | Status: AC
  Administered 2023-08-06: 300 mg via ORAL
  Filled 2023-08-06: qty 4

## 2023-08-06 MED ORDER — HYDRALAZINE HCL 50 MG PO TABS
50.0000 mg | ORAL_TABLET | Freq: Three times a day (TID) | ORAL | 2 refills | Status: DC | PRN
  Filled 2023-08-06 – 2023-08-07 (×2): qty 90, 30d supply, fill #0

## 2023-08-06 MED ORDER — CALCIUM CARBONATE ANTACID 500 MG PO CHEW
1.0000 | CHEWABLE_TABLET | Freq: Three times a day (TID) | ORAL | Status: DC | PRN
  Administered 2023-08-06: 200 mg via ORAL
  Filled 2023-08-06: qty 1

## 2023-08-06 MED ORDER — IOHEXOL 350 MG/ML SOLN: 75.0000 mL | Freq: Once | INTRAVENOUS | Status: DC | PRN

## 2023-08-06 MED ORDER — METOPROLOL TARTRATE 50 MG PO TABS
50.0000 mg | ORAL_TABLET | Freq: Two times a day (BID) | ORAL | Status: DC
  Administered 2023-08-06: 50 mg via ORAL
  Filled 2023-08-06: qty 1

## 2023-08-06 MED ORDER — ROSUVASTATIN CALCIUM 40 MG PO TABS
40.0000 mg | ORAL_TABLET | Freq: Every day | ORAL | 3 refills | Status: DC
Start: 2023-08-06 — End: 2023-10-13
  Filled 2023-08-06 – 2023-08-07 (×3): qty 90, 90d supply, fill #0

## 2023-08-06 MED ORDER — ASPIRIN 81 MG PO TBEC
81.0000 mg | DELAYED_RELEASE_TABLET | Freq: Every day | ORAL | 3 refills | Status: AC
Start: 2023-08-06 — End: 2024-08-05
  Filled 2023-08-06 – 2023-08-07 (×3): qty 90, 90d supply, fill #0
  Filled 2024-03-06: qty 90, 90d supply, fill #1

## 2023-08-06 MED ORDER — METOPROLOL TARTRATE 50 MG PO TABS
50.0000 mg | ORAL_TABLET | Freq: Two times a day (BID) | ORAL | 3 refills | Status: DC
  Filled 2023-08-06 – 2023-08-07 (×2): qty 90, 45d supply, fill #0

## 2023-08-06 MED ORDER — ASPIRIN 81 MG PO TBEC
81.0000 mg | DELAYED_RELEASE_TABLET | Freq: Every day | ORAL | 3 refills | Status: DC
  Filled 2023-08-06: qty 90, 90d supply, fill #0

## 2023-08-06 MED ORDER — CLOPIDOGREL BISULFATE 75 MG PO TABS: 75.0000 mg | ORAL_TABLET | Freq: Every day | ORAL | Status: DC

## 2023-08-06 MED ORDER — IOHEXOL 350 MG/ML SOLN
75.0000 mL | Freq: Once | INTRAVENOUS | Status: AC | PRN
  Administered 2023-08-06: 75 mL via INTRAVENOUS

## 2023-08-06 MED ORDER — HYDRALAZINE HCL 50 MG PO TABS: 50.0000 mg | ORAL_TABLET | Freq: Four times a day (QID) | ORAL | Status: DC | PRN

## 2023-08-06 NOTE — Consult Note (Signed)
Neurology Consultation Reason for Consult: TIA Requesting Physician: [name]  CC: Transient speech difficulty  History is obtained from: patient and chart review   HPI: [name] is a 67 y.o. 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  She had some difficulty with her speech and was dropped at home by her daughter but continued to have difficulty using her phone.  Therefore she called her daughter and was able to communicate that she will need to be brought to the hospital for further evaluation.  By the time of her arrival her symptoms had resolved and she estimates they lasted for about 45 minutes.  She notes that she still has a lot of pain from her recent left hip fracture and that she developed a left heel pressure ulcer while in rehab.  However she is not have any fevers or chills, rashes or skin changes other focal neurological deficits or other concerns  She notes that she has not been taking her medications as she should and has not been checking her sugar as many of her supplies were misplaced during her recent move.  However she notes she has found her blood sugar and blood pressure monitoring devices and is motivated to take medications as prescribed going forward.  Regarding her stroke history she had P1 occlusion on 04/19/2021 for which she received tPA as well as thrombectomy.  She has residual mild left hemiparesis and left  upper extremity ataxia as well as sensory loss in the left face/arm and leg  LKW: 8/22 afternoon Thrombolytic given?: No, back to baseline by the time of arrival IA performed?: No, exam not consistent with LVO Premorbid modified rankin scale:      2 - Slight disability. Able to look after own affairs without assistance, but unable to carry out all previous activities.   ROS: All other review of systems was negative except as noted in the HPI.   Past Medical History:  Diagnosis Date   Anxiety    Arthritis    CAD (coronary artery disease)    a. 3 vessel CAD noted on coronary CTA 04/2021, s/p CABG x4   Cataract    left   Depression    Diabetes mellitus without complication (HCC)    type 2   Environmental and seasonal allergies    GERD (gastroesophageal reflux disease)    Hyperlipidemia    Hypertension    Hypertensive urgency 04/18/2021   Hypoglycemia 06/09/2023   Joint pain    as reported by patient   Papillary fibroelastoma of heart    of the aortic valve,  s/p AVR and tumor resection on 08/04/2021   Right thalamic infarction (HCC) 04/29/2021   S/P AVR (aortic valve replacement)    s/p AVR with resection of AV fibroelastoma on 08/04/2021   S/P CABG x 4    a. s/p CABG x4 LIMA to LAD, SVG to PDA, and Sequential SVG to OM and Diagonal on 8/   Stroke (HCC) 04/2021   admitted at [facility] - left sided weakness  Urinary incontinence    as stated by patient   Past Surgical History:  Procedure Laterality Date   AORTIC VALVE REPLACEMENT N/A 08/04/2021   Procedure: RESECTION AORTIC VALVE TUMOR;  Surgeon: Loreli Slot, MD;  Location: [facility] OR;  Service: Open Heart Surgery;  Laterality: N/A;   APPLICATION OF WOUND VAC N/A 08/24/2021   Procedure: APPLICATION OF WOUND VAC;  Surgeon: Alleen Borne, MD;  Location: [facility] OR;  Service: Thoracic;  Laterality: N/A;   APPLICATION OF WOUND VAC N/A 08/28/2021   Procedure: WOUND VAC CHANGE;  Surgeon: Loreli Slot, MD;  Location:  [facility] OR;  Service: Thoracic;  Laterality: N/A;   BREAST EXCISIONAL BIOPSY Right 1999   neg   BUBBLE STUDY  04/24/2021   Procedure: BUBBLE STUDY;  Surgeon: Quintella Reichert, MD;  Location: [facility] ENDOSCOPY;  Service: Cardiovascular;;   CATARACT EXTRACTION  12/2011   CATARACT EXTRACTION W/PHACO Left 12/02/2016   Procedure: CATARACT EXTRACTION PHACO AND INTRAOCULAR LENS PLACEMENT (IOC);  Surgeon: Sallee Lange, MD;  Location: [facility] ORS;  Service: Ophthalmology;  Laterality: Left;  [place] 2:14 AP% 28.4 CDE 59.73 Fluid pack lot # [id number]   COLONOSCOPY WITH PROPOFOL N/A 04/13/2022   Procedure: COLONOSCOPY WITH PROPOFOL;  Surgeon: Toney Reil, MD;  Location: [facility] ENDOSCOPY;  Service: Gastroenterology;  Laterality: N/A;   CORONARY ARTERY BYPASS GRAFT N/A 08/04/2021   Procedure: CORONARY ARTERY BYPASS GRAFTING (CABG) X  4  USING LEFT INTERNAL MAMMARY ARTERY AND RIGHT GREATER SAPHENOUS VEIN ENDOSCOPIC CONDUITS;  Surgeon: Loreli Slot, MD;  Location: [facility] OR;  Service: Open Heart Surgery;  Laterality: N/A;   DIAGNOSTIC LAPAROSCOPY     EYE SURGERY     HIP ARTHROPLASTY Left 06/02/2023   Procedure: ARTHROPLASTY BIPOLAR HIP (HEMIARTHROPLASTY);  Surgeon: Juanell Fairly, MD;  Location: [facility] ORS;  Service: Orthopedics;  Laterality: Left;   IR CT HEAD LTD  04/19/2021   IR PERCUTANEOUS ART THROMBECTOMY/INFUSION INTRACRANIAL INC DIAG ANGIO  04/19/2021       IR PERCUTANEOUS ART THROMBECTOMY/INFUSION INTRACRANIAL INC DIAG ANGIO  04/19/2021   IR US GUIDE VASC ACCESS LEFT  04/19/2021   RADIOLOGY WITH ANESTHESIA N/A 04/19/2021   Procedure: IR WITH ANESTHESIA;  Surgeon: Julieanne Cotton, MD;  Location: [facility] OR;  Service: Radiology;  Laterality: N/A;   STERNAL WOUND DEBRIDEMENT N/A 08/24/2021   Procedure: STERNAL WOUND DRAINAGE AND DEBRIDEMENT;  Surgeon: Alleen Borne, MD;  Location: [facility] OR;  Service: Thoracic;  Laterality: N/A;   STERNAL WOUND DEBRIDEMENT N/A 08/28/2021   Procedure: STERNAL WOUND  DEBRIDEMENT;  Surgeon: Loreli Slot, MD;  Location: [facility] OR;  Service: Thoracic;  Laterality: N/A;   TEE WITHOUT CARDIOVERSION N/A 04/24/2021   Procedure: TRANSESOPHAGEAL ECHOCARDIOGRAM (TEE);  Surgeon: Quintella Reichert, MD;  Location: [facility] ENDOSCOPY;  Service: Cardiovascular;  Laterality: N/A;   TEE WITHOUT CARDIOVERSION N/A 08/04/2021   Procedure: TRANSESOPHAGEAL ECHOCARDIOGRAM (TEE);  Surgeon: Loreli Slot, MD;  Location: [facility] OR;  Service: Open Heart Surgery;  Laterality: N/A;   TOTAL HIP ARTHROPLASTY Right 04/04/2018   Procedure: TOTAL HIP ARTHROPLASTY;  Surgeon: Donato Heinz, MD;  Location: [facility] ORS;  Service: Orthopedics;  Laterality: Right;   Current Outpatient Medications  Medication Instructions   acetaminophen (TYLENOL) 1,000 mg, Oral, Every 8 hours   ALPRAZolam (XANAX) 0.5 MG tablet Take 1 tablet (0.5 mg total) by mouth 2 (two) times daily as needed for anxiety   aspirin EC 81 mg, Oral, Daily, Swallow whole.   Blood Glucose Monitoring Suppl (BLOOD GLUCOSE MONITOR  SYSTEM) w/Device KIT 1 each, Does not apply, 3 times daily   Blood Glucose Monitoring Suppl (ONETOUCH VERIO REFLECT) w/Device KIT Use as directed.   empagliflozin (JARDIANCE) 10 mg, Oral, Daily before breakfast   glucose blood (FREESTYLE LITE) test strip Use to check blood sugar up to 4 (four) times daily as needed for diabetes.   glyBURIDE (DIABETA) 10 mg, Oral, 2 times daily with meals   latanoprost (XALATAN) 0.005 % ophthalmic solution 1 drop, Both Eyes, (Dosepack) Nightly - one time   meloxicam (MOBIC) 7.5 mg, Oral, 2 times daily   metFORMIN (GLUCOPHAGE-XR) 1,000 mg, Oral, 2 times daily with meals   Multiple Vitamin (MULTIVITAMIN) tablet 1 tablet, Oral, Daily   oxybutynin (DITROPAN-XL) 10 mg, Oral, Daily   rosuvastatin (CRESTOR) 40 mg, Oral, Daily   sertraline (ZOLOFT) 25 MG tablet Take 1 tablet by mouth at bedtime.     Family History  Problem Relation Age of Onset   Bone cancer Brother     Aneurysm Mother        brain   Diabetes Father    CAD Father    Heart failure Father    Colon cancer Father    Cancer Father    Alzheimer's disease Paternal Grandmother    Dementia Paternal Grandmother    Mental illness Paternal Grandfather    Healthy Daughter    Healthy Son    Breast cancer Neg Hx     Social History:  reports that she quit smoking about 16 years ago. Her smoking use included cigarettes. She started smoking about 41 years ago. She has a 37.5 pack-year smoking history. She has never used smokeless tobacco. She reports that she does not currently use alcohol. She reports that she does not use drugs.   Exam: Current vital signs: BP (!) 149/84   Pulse (!) 57   Temp 98.1 F (36.7 C)   Resp 18   Ht 5\' 7"  (1.702 m)   SpO2 96%   BMI 26.63 kg/m  Vital signs in last 24 hours: Temp:  [97.6 F (36.4 C)-98.1 F (36.7 C)] 98.1 F (36.7 C) (08/23 1200) Pulse Rate:  [57-98] 57 (08/23 1200) Resp:  [16-18] 18 (08/23 1200) BP: (132-189)/(62-110) 149/84 (08/23 1200) SpO2:  [95 %-100 %] 96 % (08/23 1200)   Physical Exam  Constitutional: Appears well-developed and well-nourished.  Psych: Affect appropriate to situation, pleasant and cooperative Eyes: No scleral injection HENT: No oropharyngeal obstruction.  MSK: no joint deformities.  Cardiovascular: Perfusing extremities well Respiratory: Effort normal, non-labored breathing GI: Soft.  No distension. There is no tenderness.  Skin: Warm dry and intact visible skin  Neuro: Mental Status: Patient is awake, alert, oriented to person, place, month, year, and situation. Patient is able to give a clear and coherent history. No signs of aphasia or neglect Cranial Nerves: II: Visual Fields are full.  Left pupil is ovoid but reactive (postsurgical), right pupil is 4 mm to 2 mm III,IV, VI: EOMI without ptosis or diploplia.  V: Facial sensation is reduced on the left VII: Facial movement is symmetric.  VIII: hearing is  intact to voice X: Uvula elevates symmetrically XI: Shoulder shrug is symmetric. XII: tongue is midline without atrophy or fasciculations.  Motor: Tone is normal. Bulk is normal. 5/5 strength on the right side except for hip flexion which is 4/5.  Mild hemiparesis in an upper motor neuron on the left side 4/5, with lower extremity testing also limited by hip pain from her recent hip surgery Sensory:  Sensation is reduced in the left face arm and leg Deep Tendon Reflexes: Mildly increased on the left compared the right  Cerebellar: Marked ataxia of the left upper extremity.  Normal right upper extremity finger-nose.  Left lower extremity pain limited heel-to-shin.  Right lower extremity normal heel-to-shin Gait:  Deferred   NIHSS total 2 Score breakdown: 1 point for left upper extremity ataxia, 1 point for sensory loss in the left face arm and leg (noting left lower extremity ataxia is limited in testing due to pain)    I have reviewed labs in epic and the results pertinent to this consultation are:  Basic Metabolic Panel: Recent Labs  Lab 08/05/23 1401 08/06/23 1124  NA 133* 133*  K 3.6 4.0  CL 99 98  CO2 26 25  GLUCOSE 274* 179*  BUN 14 17  CREATININE 0.90 0.81  CALCIUM 9.3 9.5  MG  --  1.8  PHOS  --  4.1    CBC: Recent Labs  Lab 08/05/23 1401 08/06/23 1124  WBC 7.8 8.5  NEUTROABS 5.9  --   HGB 15.2* 14.1  HCT 45.8 41.9  MCV 83.0 82.5  PLT 274 280    Coagulation Studies: Recent Labs    08/05/23 1431  LABPROT 13.5  INR 1.0    Lab Results  Component Value Date   HGBA1C 11.0 (H) 05/31/2023   Lab Results  Component Value Date   CHOL 248 (H) 08/06/2023   HDL 40 (L) 08/06/2023   LDLCALC 184 (H) 08/06/2023   TRIG 121 08/06/2023   CHOLHDL 6.2 08/06/2023     I have reviewed the images obtained:  ECHO  1. Left ventricular ejection fraction, by estimation, is 55 to 60%. The  left ventricle has normal function. The left ventricle has no regional  wall  motion abnormalities. There is mild left ventricular hypertrophy.  Left ventricular diastolic parameters  are consistent with Grade I diastolic dysfunction (impaired relaxation).   2. Right ventricular systolic function is normal. The right ventricular  size is normal.   3. The mitral valve is normal in structure. Mild mitral valve  regurgitation.   4. The aortic valve is tricuspid. Aortic valve regurgitation is mild to  moderate.   5. Agitated saline contrast bubble study was negative, with no evidence  of any interatrial shunt.  [Normal biatrial sizes]  CT head and neck radiology report pending.  On my review no hemodynamically significant stenosis affecting the CNS blood supply  Impression: TIA in the setting of uncontrolled risk factors, based on symptoms reported left MCA territory  Recommendations: -Dual antiplatelet therapy for 21 days followed by Plavix monotherapy -300 mg Plavix x 1 dose -Resume home Crestor 40 mg nightly.  If LDL remains uncontrolled despite medication adherence consider addition of PSK 9 inhibitor -Continued close outpatient follow-up for optimization of risk factors -Inpatient neurology will sign off at this time, please do not hesitate to reach out with additional questions or concerns.  Discussed with primary team via secure chat and by phone  Brooke Dare MD-PhD [facility] [phone] [facility] coverage for [facility] is from 8 AM to 4 AM in-house and 4 PM to 8 PM by telephone/video. 8 PM to 8 AM emergent questions or overnight urgent questions should be addressed to Teleneurology On-call or [facility] neurohospitalist; contact information can be found on AMION

## 2023-08-06 NOTE — ED Notes (Signed)
Patient ambulated to restroom using walker.

## 2023-08-06 NOTE — Evaluation (Signed)
Occupational Therapy Evaluation Patient Details Name: [name] MRN: [id number] DOB: 1956-03-14 Today's Date: 08/06/2023   History of Present Illness Pt is a 67 year old female presenting to the ED with stroke like symptoms; MRI of the brain showing   No acute intracranial process. No evidence of acute or subacute  infarct.      PMH significant for  stroke, with left-sided deficit, hyperlipidemia, hypertension, non-insulin-dependent diabetes mellitus, CAD status post CABG   Clinical Impression   Chart reviewed to date, pt greeted in room, alert and oriented x4, agreeable to OT evaluation. Pt reports symptoms that brought her to the ED she feels have resolved. PTA pt is amb with a RW due to L hip surgery approx 2 months ago, was in rehab and then discharged home with no [facility] services per pt report. Pt requires assist for LB dressing when not using AD, daughter assists with IADLs at this time. Pt reports she feels she is performing ADL/IADL and functional mobility close to current baseline, is considering outpatient PT for hip issues. Amb in room with RW completed with MOD I, toileting/toilet transfer completed with MOD I with RW. Grooming at sink level completed with MOD I. No vision/sensory/perception deficits noted. No further OT needs identified at this time, pt requests no therapy follow up in hospital. Please re consult if there has been a change in functional status.       If plan is discharge home, recommend the following: A little help with bathing/dressing/bathroom;Help with stairs or ramp for entrance;Assist for transportation    Functional Status Assessment  Patient has had a recent decline in their functional status and demonstrates the ability to make significant improvements in function in a reasonable and predictable amount of time.  Equipment Recommendations  None recommended by OT    Recommendations for Other Services       Precautions / Restrictions  Precautions Precautions: None Precaution Comments: Previous posterior hip precautions in late June 2024 Restrictions Weight Bearing Restrictions: No      Mobility Bed Mobility Overal bed mobility: Needs Assistance Bed Mobility: Supine to Sit, Sit to Supine     Supine to sit: Modified independent (Device/Increase time) Sit to supine: Modified independent (Device/Increase time)        Transfers Overall transfer level: Needs assistance Equipment used: Rolling walker (2 wheels) Transfers: Sit to/from Stand Sit to Stand: Modified independent (Device/Increase time)                  Balance Overall balance assessment: Needs assistance Sitting-balance support: Bilateral upper extremity supported, Feet supported Sitting balance-Leahy Scale: Good     Standing balance support: Bilateral upper extremity supported, During functional activity, Reliant on assistive device for balance Standing balance-Leahy Scale: Fair                             ADL either performed or assessed with clinical judgement   ADL Overall ADL's : Needs assistance/impaired Eating/Feeding: Set up;Sitting   Grooming: Wash/dry hands;Standing;Modified independent               Lower Body Dressing: Maximal assistance Lower Body Dressing Details (indicate cue type and reason): no AD- pt reports use of AD at home or daugther helps with socks; slip on shoes Toilet Transfer: Modified Independent;Rolling walker (2 wheels);BSC/3in1   Toileting- Clothing Manipulation and Hygiene: Modified independent;Sitting/lateral lean Toileting - Clothing Manipulation Details (indicate cue type and reason): continent urination on  toilet     Functional mobility during ADLs: Modified independent;Supervision/safety;Rollator (4 wheels) (house hold distances in room, superviison, then progess to MOD I) General ADL Comments: pt reports she feels she is performing ADLs at current baseline after dc from rehab, has  help where she needs it     Vision Patient Visual Report: No change from baseline       Perception Perception: Within Functional Limits       Praxis Praxis: [facility]       Pertinent Vitals/Pain Pain Assessment Pain Assessment: No/denies pain     Extremity/Trunk Assessment Upper Extremity Assessment Upper Extremity Assessment: Overall WFL for tasks assessed   Lower Extremity Assessment Lower Extremity Assessment: Generalized weakness;Defer to PT evaluation LLE Deficits / Details: L hip internally rotated with gait. PT reports due to pain form L heel pressure ulcer. Able to correct upon cuing. LLE Sensation: WNL   Cervical / Trunk Assessment Cervical / Trunk Assessment: Normal   Communication Communication Communication: No apparent difficulties   Cognition Arousal: Alert Behavior During Therapy: WFL for tasks assessed/performed Overall Cognitive Status: Within Functional Limits for tasks assessed                                       General Comments  vss throughout, bandaid intact on L heel where she reports break down    Exercises Other Exercises Other Exercises: edu reL role of OT, role of rehab, discharge recommendations, home safety/ safe ADL completion;   Shoulder Instructions      Home Living Family/patient expects to be discharged to:: Private residence Living Arrangements: Alone Available Help at Discharge: Family;Available PRN/intermittently (daugther checks on patient multiple times a day, lives on the next street over per pt report) Type of Home: House Home Access: Stairs to enter [employer] of Steps: 3 Entrance Stairs-Rails: Right Home Layout: One level     Bathroom Shower/Tub: Chief Strategy Officer: Standard Bathroom Accessibility: Yes   Home Equipment: Agricultural consultant (2 wheels);Cane - quad;Shower seat;BSC/3in1;Adaptive equipment Adaptive Equipment: Reacher        Prior Functioning/Environment Prior  Level of Function : Needs assist       Physical Assist : ADLs (physical)   ADLs (physical): IADLs Mobility Comments: amb with RW since discharge from rehab ADLs Comments: MOD I with dressing, grooming, feeding; assist for IADLs from daugther and prn assist for bathing        OT Problem List:        OT Treatment/Interventions:      OT Goals(Current goals can be found in the care plan section) Acute Rehab OT Goals Patient Stated Goal: go home OT Goal Formulation: With patient Time For Goal Achievement: 08/20/23 Potential to Achieve Goals: Good  OT Frequency:      Co-evaluation              AM-PAC OT "6 Clicks" Daily Activity     Outcome Measure Help from another person eating meals?: None Help from another person taking care of personal grooming?: None Help from another person toileting, which includes using toliet, bedpan, or urinal?: None Help from another person bathing (including washing, rinsing, drying)?: A Little Help from another person to put on and taking off regular upper body clothing?: None Help from another person to put on and taking off regular lower body clothing?: A Lot 6 Click Score: 21   End of Session  Equipment Utilized During Treatment: Rolling walker (2 wheels) Nurse Communication:  (nurse briefly in room during sessoin, aware OT in room with pt)  Activity Tolerance: Patient tolerated treatment well Patient left: in bed;with call bell/phone within reach  OT Visit Diagnosis: Other abnormalities of gait and mobility (R26.89)                Time: 1610-9604 OT Time Calculation (min): 13 min Charges:  OT General Charges $OT Visit: 1 Visit OT Evaluation $OT Eval Low Complexity: 1 Low Oleta Mouse, OTD OTR/L  08/06/23, 4:04 PM

## 2023-08-06 NOTE — Discharge Summary (Incomplete)
[facility] Discharge Summary   Patient: [name] OZH:[id number]  PCP: Malva Limes, MD  Date of admission: 08/05/2023   Date of discharge:  08/06/2023     Discharge Diagnoses:  Principal Problem:   Stroke-like symptom Active Problems:   Hyponatremia   Anxiety and depression   History of CVA with residual deficit   Papillary fibroelastoma of heart   S/P CABG x 4   Diabetic peripheral neuropathy (HCC)   Hyperlipidemia associated with type 2 diabetes mellitus (HCC)   Admitted From: Home Disposition:  Home   Recommendations for Outpatient Follow-up:  Follow-up with PCP in 1 week, monitor BP and follow-up with PCP to titrate medications accordingly. Started Plavix for 3 weeks, continue baby aspirin and statin. Started metoprolol 50 mg p.o. twice daily, hydralazine 50 mg p.o. 3 times daily as needed systolic BP greater than 130 mmHg. Follow-up with neurology in 1 to 2 weeks. Follow up LABS/TEST:    Discussed with patient's daughter and patient's son to pick up medications from [facility] [place] [facility] which is open on Saturday from 8 AM to 4:30 PM  Diet recommendation: Cardiac and Carb modified diet  Activity: The patient is advised to gradually reintroduce usual activities, as tolerated  Discharge Condition: stable  Code Status: Full code   History of present illness: As per the H and P dictated on admission Hospital Course:  [name] is a 67 year old female with prior history of a stroke, with left-sided deficit, hyperlipidemia, hypertension, non-insulin-dependent diabetes mellitus, CAD status post CABG, presents to the emergency department for chief concerns of stroke-like symptoms.  Patient was at doctor's office and found to have SBP over 200.  Patient was with her daughter who drove her to [employer] drive-through and at that time she was unable to get her words out.  She knew what she was thinking but could not say our get her words  out.  No any other neurological changes.  Patient went home she walks with a walker and laid her on couch, symptoms lasted for 1 hour so she was brought into the [facility] ED.  When she arrived in the ED all the symptoms resolved.  Vitals in the ED showed temperature of 97.9, respiration rate of 18, heart rate of 91, blood pressure 174/110, SpO2 of 96% on room air.   Na 133, K 3.6, cl 99, bicarb 26, BUN of 48, sCr 0.90, EGFR> 60, nonfasting blood glucose 274, WBC 7.8, Hb 15.2, plts of 274.   CT of the head without contrast: Read as negative for intracranial hemorrhage or mass.  Age-indeterminate hypodensity/positive infarct within the right white matter.  MRI could be obtained for further evaluation.  Chronic thalamic infarct and minimal chronic small vessel ischemic changes of the white matter.  Assessment and Plan:  # TIA/Stroke-like symptom C/o aphasia lasted for 1 hour, all symptoms resolved on arrival to the ED.  MRI brain negative for any acute findings.  LDL 184 above goal 70, hemoglobin A1c on 05/31/2023 was 11 elevated.  TTE shows LVEF 55 to 60%, negative PFO, no any other acute findings.  Seen by PT and OT and SLP, no needs.  CTA head and neck negative for large vessel occlusion, mild to moderate atherosclerosis.  Seen by neurologist, recommended DAPT aspirin and Plavix for 21 days followed by aspirin 81 mg p.o. daily.  Continue Crestor.  Follow-up with PCP in 1 week, follow-up with neurology in 1 to 2 weeks.  Repeat lipid profile  after 3 to 6 months. # Hypertension, patient is not on any medication at home.  Started metoprolol 50 mg p.o. twice daily and hydralazine 50 mg p.o. 3 times daily as needed if systolic BP greater than 150 mmHg.  Patient was advised to monitor BP at home and follow with PCP to titrate medication accordingly. # Hyponatremia: Mild, could be nutritional deficiency.  Follow-up with PCP, may repeat BMP after few months # Anxiety and depression: Continued alprazolam 0.5 mg p.o.  twice daily as needed for anxiety and Sertraline 25 mg nightly resumed # Hyperlipidemia associated with type 2 diabetes mellitus Rosuvastatin 40 mg daily resumed.  Resumed home regimen for diabetes.  Monitor CBG, continue diabetic diet.  Follow-up with PCP to repeat A1c after 1 month # S/P CABG x 4: Patient was on aspirin 81 mg daily, rosuvastatin 40 mg daily resumed on admission.  LDL was high so I am not sure whether she was compliant with Crestor.  Body mass index is 26.63 kg/m.  Nutrition Interventions:  - Patient was instructed, not to drive, operate heavy machinery, perform activities at heights, swimming or participation in water activities or provide baby sitting services while on Pain, Sleep and Anxiety Medications; until her outpatient Physician has advised to do so again.  - Also recommended to not to take more than prescribed Pain, Sleep and Anxiety Medications.  Patient was seen by physical therapy, who recommended no therapy needed on discharge,  On the day of the discharge the patient's vitals were stable, and no other acute medical condition were reported by patient. the patient was felt safe to be discharge at Home.  Consultants: Neurologist Procedures: None  Discharge Exam: General: Appear in no distress, no Rash; Oral Mucosa Clear, moist. Cardiovascular: S1 and S2 Present, no Murmur, Respiratory: normal respiratory effort, Bilateral Air entry present and no Crackles, no wheezes Abdomen: Bowel Sound present, Soft and no tenderness, no hernia Extremities: no Pedal edema, no calf tenderness Neurology: alert and oriented to time, place, and person affect appropriate.  There were no vitals filed for this visit. Vitals:   08/06/23 1200 08/06/23 1811  BP: (!) 149/84 (!) 145/71  Pulse: (!) 57 75  Resp: 18 18  Temp: 98.1 F (36.7 C) 97.8 F (36.6 C)  SpO2: 96% 98%    DISCHARGE MEDICATION: Allergies as of 08/06/2023       Reactions   Jardiance [empagliflozin]  Itching   Recurrent mycotic infections   Penicillins Rash, Other (See Comments)   Has patient had a PCN reaction causing immediate rash, facial/tongue/throat swelling, SOB or lightheadedness with hypotension: No Has patient had a PCN reaction causing severe rash involving mucus membranes or skin necrosis: No Has patient had a PCN reaction that required hospitalization No Has patient had a PCN reaction occurring within the last 10 years: No If all of the above answers are "NO", then may proceed with Cephalosporin use.        Medication List     STOP taking these medications    meloxicam 7.5 MG tablet Commonly known as: MOBIC       TAKE these medications    acetaminophen 500 MG tablet Commonly known as: TYLENOL Take 2 tablets (1,000 mg total) by mouth every 8 (eight) hours.   ALPRAZolam 0.5 MG tablet Commonly known as: XANAX Take 1 tablet (0.5 mg total) by mouth 2 (two) times daily as needed for anxiety   aspirin EC 81 MG tablet Take 1 tablet (81 mg total) by mouth daily.  Swallow whole.   clopidogrel 75 MG tablet Commonly known as: PLAVIX Take 1 tablet (75 mg total) by mouth daily for 21 days. Start taking on: August 07, 2023   empagliflozin 10 MG Tabs tablet Commonly known as: Jardiance Take 1 tablet (10 mg total) by mouth daily before breakfast.   glyBURIDE 5 MG tablet Commonly known as: DIABETA Take 2 tablets (10 mg total) by mouth 2 (two) times daily with a meal.   hydrALAZINE 50 MG tablet Commonly known as: APRESOLINE Take 1 tablet (50 mg total) by mouth 3 (three) times daily as needed (SBP >150).   latanoprost 0.005 % ophthalmic solution Commonly known as: XALATAN Place 1 drop into both eyes Nightly.   metFORMIN 500 MG 24 hr tablet Commonly known as: GLUCOPHAGE-XR Take 2 tablets (1,000 mg total) by mouth 2 (two) times daily with a meal.   metoprolol tartrate 50 MG tablet Commonly known as: LOPRESSOR Take 1 tablet (50 mg total) by mouth 2 (two) times  daily.   multivitamin tablet Take 1 tablet by mouth daily.   OneTouch Verio Reflect w/Device Kit 1 each by Does not apply route in the morning, at noon, and at bedtime.   OneTouch Verio Reflect w/Device Kit Use as directed.   OneTouch Verio test strip Generic drug: glucose blood Use to check blood sugar up to 4 (four) times daily as needed for diabetes.   oxybutynin 10 MG 24 hr tablet Commonly known as: DITROPAN-XL Take 1 tablet (10 mg total) by mouth daily.   rosuvastatin 40 MG tablet Commonly known as: CRESTOR Take 1 tablet (40 mg total) by mouth daily.   sertraline 25 MG tablet Commonly known as: Zoloft Take 1 tablet by mouth at bedtime.       Allergies  Allergen Reactions   Jardiance [Empagliflozin] Itching    Recurrent mycotic infections   Penicillins Rash and Other (See Comments)    Has patient had a PCN reaction causing immediate rash, facial/tongue/throat swelling, SOB or lightheadedness with hypotension: No Has patient had a PCN reaction causing severe rash involving mucus membranes or skin necrosis: No Has patient had a PCN reaction that required hospitalization No Has patient had a PCN reaction occurring within the last 10 years: No If all of the above answers are "NO", then may proceed with Cephalosporin use.    Discharge Instructions     Call MD for:   Complete by: As directed    Any new neurological symptoms, weakness, numbness or aphasia.   Call MD for:  extreme fatigue   Complete by: As directed    Call MD for:  persistant dizziness or light-headedness   Complete by: As directed    Call MD for:  severe uncontrolled pain   Complete by: As directed    Diet - low sodium heart healthy   Complete by: As directed    Discharge instructions   Complete by: As directed    Follow-up with PCP in 1 week, monitor BP and follow-up with PCP to titrate medications accordingly. Started Plavix for 3 weeks, continue baby aspirin and statin. Started metoprolol 50  mg p.o. twice daily, hydralazine 50 mg p.o. 3 times daily as needed systolic BP greater than 130 mmHg. Follow-up with neurology in 1 to 2 weeks.   Increase activity slowly   Complete by: As directed        The results of significant diagnostics from this hospitalization (including imaging, microbiology, ancillary and laboratory) are listed below for reference.  Significant Diagnostic Studies: ECHOCARDIOGRAM COMPLETE  Result Date: 08/06/2023    ECHOCARDIOGRAM REPORT   Patient Name:   [name] Date of Exam: 08/06/2023 Medical Rec #:  [id number]       Height:       67.0 in Accession #:    [id number]      Weight:       170.0 lb Date of Birth:  December 17, 1955      BSA:          1.887 m Patient Age:    85 years        BP:           147/81 mmHg Patient Gender: F               HR:           86 bpm. Exam Location:  [facility] Procedure: 2D Echo, Cardiac Doppler, Color Doppler and Saline Contrast Bubble            Study Indications:     TIA G45.9                  Stroke I63.9  History:         Patient has prior history of Echocardiogram examinations, most                  recent 08/04/2021. Prior CABG, Stroke; Risk                  Factors:Hypertension.  Sonographer:     Cristela Blue Referring Phys:  [id number] AMY N COX Diagnosing Phys: Debbe Odea MD  Sonographer Comments: Suboptimal parasternal window. IMPRESSIONS  1. Left ventricular ejection fraction, by estimation, is 55 to 60%. The left ventricle has normal function. The left ventricle has no regional wall motion abnormalities. There is mild left ventricular hypertrophy. Left ventricular diastolic parameters are consistent with Grade I diastolic dysfunction (impaired relaxation).  2. Right ventricular systolic function is normal. The right ventricular size is normal.  3. The mitral valve is normal in structure. Mild mitral valve regurgitation.  4. The aortic valve is tricuspid. Aortic valve regurgitation is mild to moderate.  5. Agitated saline contrast  bubble study was negative, with no evidence of any interatrial shunt. FINDINGS  Left Ventricle: Left ventricular ejection fraction, by estimation, is 55 to 60%. The left ventricle has normal function. The left ventricle has no regional wall motion abnormalities. The left ventricular internal cavity size was normal in size. There is  mild left ventricular hypertrophy. Left ventricular diastolic parameters are consistent with Grade I diastolic dysfunction (impaired relaxation). Right Ventricle: The right ventricular size is normal. No increase in right ventricular wall thickness. Right ventricular systolic function is normal. Left Atrium: Left atrial size was normal in size. Right Atrium: Right atrial size was normal in size. Pericardium: There is no evidence of pericardial effusion. Mitral Valve: The mitral valve is normal in structure. Mild to moderate mitral annular calcification. Mild mitral valve regurgitation. MV peak gradient, 6.9 mmHg. The mean mitral valve gradient is 3.0 mmHg. Tricuspid Valve: The tricuspid valve is not well visualized. Tricuspid valve regurgitation is not demonstrated. Aortic Valve: The aortic valve is tricuspid. Aortic valve regurgitation is mild to moderate. Aortic valve mean gradient measures 3.3 mmHg. Aortic valve peak gradient measures 5.7 mmHg. Aortic valve area, by VTI measures 2.12 cm. Pulmonic Valve: The pulmonic valve was not well visualized. Pulmonic valve regurgitation is not visualized. Aorta: The aortic root is normal in  size and structure. Venous: The inferior vena cava was not well visualized. IAS/Shunts: No atrial level shunt detected by color flow Doppler. Agitated saline contrast was given intravenously to evaluate for intracardiac shunting. Agitated saline contrast bubble study was negative, with no evidence of any interatrial shunt.  LEFT VENTRICLE PLAX 2D LVIDd:         3.90 cm   Diastology LVIDs:         2.80 cm   LV e' medial:    4.68 cm/s LV PW:         1.20 cm    LV E/e' medial:  15.8 LV IVS:        1.40 cm   LV e' lateral:   6.20 cm/s LVOT diam:     2.00 cm   LV E/e' lateral: 11.9 LV SV:         42 LV SV Index:   22 LVOT Area:     3.14 cm  RIGHT VENTRICLE RV Basal diam:  2.70 cm RV Mid diam:    2.00 cm RV S prime:     6.96 cm/s TAPSE (M-mode): 2.1 cm LEFT ATRIUM           Index       RIGHT ATRIUM          Index LA diam:      2.50 cm 1.32 cm/m  RA Area:     6.73 cm LA Vol (A4C): 15.6 ml 8.27 ml/m  RA Volume:   10.50 ml 5.56 ml/m  AORTIC VALVE AV Area (Vmax):    1.83 cm AV Area (Vmean):   1.73 cm AV Area (VTI):     2.12 cm AV Vmax:           119.00 cm/s AV Vmean:          83.800 cm/s AV VTI:            0.200 m AV Peak Grad:      5.7 mmHg AV Mean Grad:      3.3 mmHg LVOT Vmax:         69.50 cm/s LVOT Vmean:        46.100 cm/s LVOT VTI:          0.135 m LVOT/AV VTI ratio: 0.67  AORTA Ao Root diam: 2.70 cm MITRAL VALVE                TRICUSPID VALVE MV Area (PHT): 4.96 cm     TR Peak grad:   12.2 mmHg MV Area VTI:   1.74 cm     TR Vmax:        175.00 cm/s MV Peak grad:  6.9 mmHg MV Mean grad:  3.0 mmHg     SHUNTS MV Vmax:       1.31 m/s     Systemic VTI:  0.14 m MV Vmean:      76.3 cm/s    Systemic Diam: 2.00 cm MV Decel Time: 153 msec MV E velocity: 73.90 cm/s MV A velocity: 140.00 cm/s MV E/A ratio:  0.53 Debbe Odea MD Electronically signed by Debbe Odea MD Signature Date/Time: 08/06/2023/12:40:04 PM    Final    MR BRAIN WO CONTRAST  Result Date: 08/05/2023 CLINICAL DATA:  Aphasia, stroke suspected EXAM: MRI HEAD WITHOUT CONTRAST TECHNIQUE: Multiplanar, multiecho pulse sequences of the brain and surrounding structures were obtained without intravenous contrast. COMPARISON:  04/19/2021 MRI head, correlation is also made with 08/05/2023 CT head FINDINGS: Brain: No restricted  diffusion to suggest acute or subacute infarct. No acute hemorrhage, mass, mass effect, or midline shift. No hydrocephalus or extra-axial collection. Normal pituitary and  craniocervical junction. Hemosiderin deposition in the right thalamus, associated with remote lacunar infarcts. Additional remote lacunar infarcts in the right basal ganglia and left thalamus. Cerebral volume is within normal limits for age Vascular: Normal arterial flow voids. Skull and upper cervical spine: Normal marrow signal. Sinuses/Orbits: Clear paranasal sinuses. No acute finding in the orbits. Status post bilateral lens replacements. Other: The mastoid air cells are well aerated. IMPRESSION: No acute intracranial process. No evidence of acute or subacute infarct. Electronically Signed   By: Wiliam Ke M.D.   On: 08/05/2023 20:50   CT HEAD WO CONTRAST  Result Date: 08/05/2023 CLINICAL DATA:  History of stroke, aphasia chronic left arm and leg weakness from prior stroke according to epic notes EXAM: CT HEAD WITHOUT CONTRAST TECHNIQUE: Contiguous axial images were obtained from the base of the skull through the vertex without intravenous contrast. RADIATION DOSE REDUCTION: This exam was performed according to the departmental dose-optimization program which includes automated exposure control, adjustment of the mA and/or kV according to patient size and/or use of iterative reconstruction technique. COMPARISON:  CT brain 08/22/2021, MRI 04/19/2021 FINDINGS: Brain: No acute territorial infarction, hemorrhage or intracranial mass. Chronic appearing thalamic infarcts. Age indeterminate hypodensity within the right white matter on series 2, image 16 through 18. No midline shift. No mass effect. Stable ventricle size. Minimal chronic small vessel ischemic changes of the white matter. Vascular: No hyperdense vessels.  Carotid vascular calcification. Skull: Normal. Negative for fracture or focal lesion. Sinuses/Orbits: No acute finding. Other: None IMPRESSION: 1. Negative for intracranial hemorrhage or mass. 2. Age indeterminate hypodensity/possible infarct within the right white matter, MRI could be obtained  for further evaluation. 3. Chronic thalamic infarcts and minimal chronic small vessel ischemic changes of the white matter. Electronically Signed   By: Jasmine Pang M.D.   On: 08/05/2023 15:25   DG Knee Complete 4 Views Left  Result Date: 07/23/2023 CLINICAL DATA:  L knee pain, fall EXAM: LEFT KNEE - COMPLETE 4 VIEW COMPARISON:  None Available. FINDINGS: No acute fracture, dislocation or subluxation. No osteolytic or osteoblastic changes. Narrowing and osteophyte formation identified medial and lateral compartments. No knee joint effusion identified. IMPRESSION: Degenerative joint disease.  No acute osseous abnormalities. Electronically Signed   By: Layla Maw M.D.   On: 07/23/2023 22:36    Microbiology: No results found for this or any previous visit (from the past 240 hour(s)).   Labs: CBC: Recent Labs  Lab 08/05/23 1401 08/06/23 1124  WBC 7.8 8.5  NEUTROABS 5.9  --   HGB 15.2* 14.1  HCT 45.8 41.9  MCV 83.0 82.5  PLT 274 280   Basic Metabolic Panel: Recent Labs  Lab 08/05/23 1401 08/06/23 1124  NA 133* 133*  K 3.6 4.0  CL 99 98  CO2 26 25  GLUCOSE 274* 179*  BUN 14 17  CREATININE 0.90 0.81  CALCIUM 9.3 9.5  MG  --  1.8  PHOS  --  4.1   Liver Function Tests: Recent Labs  Lab 08/05/23 1401  AST 12*  ALT 6  ALKPHOS 50  BILITOT 0.6  PROT 8.4*  ALBUMIN 3.9   No results for input(s): "LIPASE", "AMYLASE" in the last 168 hours. No results for input(s): "AMMONIA" in the last 168 hours. Cardiac Enzymes: No results for input(s): "CKTOTAL", "CKMB", "CKMBINDEX", "TROPONINI" in the last 168 hours. BNP (last  3 results) No results for input(s): "BNP" in the last 8760 hours. CBG: Recent Labs  Lab 08/05/23 1405 08/05/23 2233 08/06/23 0730 08/06/23 1121 08/06/23 1729  GLUCAP 252* 182* 147* 166* 174*    Time spent: 35 minutes  Signed:  [name]  [facility] 08/06/2023 6:32 PM

## 2023-08-06 NOTE — ED Notes (Signed)
Phlebotomy called for 0500 lab draw.

## 2023-08-06 NOTE — Evaluation (Signed)
Physical Therapy Evaluation Patient Details Name: [name] MRN: [id number] DOB: [birth year] Today's Date: 08/06/2023  History of Present Illness  [name] is a 67 year old female with prior history of a stroke, with left-sided deficit, hyperlipidemia, hypertension, non-insulin-dependent diabetes mellitus, CAD status post CABG, presents to the emergency department for chief concerns of stroke-like symptoms.   Clinical Impression  Pt admitted with above diagnosis. Pt currently with functional limitations due to the deficits listed below (see PT Problem List). Pt received supine in bed agreeable to PT eval. Pt reports PTA being mod-I with RW in household and short community distances. Relies on daughter for household IADL's and transportation. Denies current CVA like symptoms reporting back at baseline.   To date pt is supervision with bed mobility but relies on bed elevated and bed rails to transfer to EOB (pt reports sleeping in recliner/couch at baseline). Pt is able to STS to RW and perform toileting at supervision but independent with pericare and hand hygiene. Noted some LE muscular weakness to stand from lowered toilet requiring x2 efforts for success. Mod-I with ambulation with RW 40' in room with min VC"s for L foot and hip positioning to reduce falls risk (notable hip IR) with good carryover after cuing. Pt independent to return to supine with review of LE therex for hip strengthening. No acute PT needs or DME identified. PT to sign off. All needs in reach upon exit.       If plan is discharge home, recommend the following: Assistance with cooking/housework;Assist for transportation;A little help with bathing/dressing/bathroom   Can travel by private vehicle        Equipment Recommendations None recommended by PT  Recommendations for Other Services       Functional Status Assessment Patient has not had a recent decline in their functional status     Precautions /  Restrictions Precautions Precautions: None Precaution Comments: Previous posterior hip precautions in late June 2024 Restrictions Weight Bearing Restrictions: No      Mobility  Bed Mobility Overal bed mobility: Needs Assistance Bed Mobility: Supine to Sit     Supine to sit: Supervision, Used rails     General bed mobility comments: HOB elevated. Pt reports sleeping in recliner/couch at baseline. Patient Response: Cooperative  Transfers Overall transfer level: Needs assistance Equipment used: Rolling walker (2 wheels) Transfers: Sit to/from Stand Sit to Stand: Modified independent (Device/Increase time)                Ambulation/Gait Ambulation/Gait assistance: Modified independent (Device/Increase time) Gait Distance (Feet): 40 Feet Assistive device: Rolling walker (2 wheels) Gait Pattern/deviations: Step-through pattern, Decreased step length - left, Decreased stance time - left, Decreased weight shift to left       General Gait Details: Reports walking at her baseline. Required VC's for neutral hip alignment to place foot in neutral position to reduce falls risk and hip contracture.  Stairs            Wheelchair Mobility     Tilt Bed Tilt Bed Patient Response: Cooperative  Modified Rankin (Stroke Patients Only)       Balance Overall balance assessment: Needs assistance Sitting-balance support: Bilateral upper extremity supported, Feet supported Sitting balance-Leahy Scale: Fair     Standing balance support: Bilateral upper extremity supported, During functional activity, Reliant on assistive device for balance Standing balance-Leahy Scale: Fair Standing balance comment: baseline use of RW PTA.  Pertinent Vitals/Pain Pain Assessment Pain Assessment: Faces Faces Pain Scale: Hurts a little bit Pain Location: L hip Pain Descriptors / Indicators: Aching, Discomfort Pain Intervention(s): Limited activity  within patient's tolerance    Home Living Family/patient expects to be discharged to:: Private residence Living Arrangements: Alone Available Help at Discharge: Family;Available PRN/intermittently Type of Home: House Home Access: Stairs to enter Entrance Stairs-Rails: Right Entrance Stairs-Number of Steps: 3   Home Layout: One level Home Equipment: Agricultural consultant (2 wheels);Cane - quad;Shower seat;BSC/3in1      Prior Function Prior Level of Function : Needs assist       Physical Assist : ADLs (physical)   ADLs (physical): Bathing;IADLs Mobility Comments: Daughter helps with bathing and household tasks like meals and cleaning. Also helps with transportation.       Extremity/Trunk Assessment   Upper Extremity Assessment Upper Extremity Assessment: Defer to OT evaluation;Overall WFL for tasks assessed    Lower Extremity Assessment Lower Extremity Assessment: Generalized weakness;LLE deficits/detail LLE Deficits / Details: L hip internally rotated with gait. PT reports due to pain form L heel pressure ulcer. Able to correct upon cuing. LLE Sensation: WNL    Cervical / Trunk Assessment Cervical / Trunk Assessment: Normal  Communication   Communication Communication: No apparent difficulties  Cognition Arousal: Alert Behavior During Therapy: WFL for tasks assessed/performed Overall Cognitive Status: Within Functional Limits for tasks assessed                                          General Comments General comments (skin integrity, edema, etc.): VSS    Exercises Other Exercises Other Exercises: Reviewed L hip exercises as pt has questions as she is ~2 months s/p L hip hemi arthroplasty. Provided HEP.   Assessment/Plan    PT Assessment Patient does not need any further PT services  PT Problem List Decreased strength;Decreased mobility       PT Treatment Interventions DME instruction;Gait training;Stair training;Patient/family  education;Therapeutic activities;Therapeutic exercise;Balance training;Functional mobility training    PT Goals (Current goals can be found in the Care Plan section)  Acute Rehab PT Goals Patient Stated Goal: to return home PT Goal Formulation: With patient Time For Goal Achievement: 08/20/23 Potential to Achieve Goals: Good    Frequency       Co-evaluation               AM-PAC PT "6 Clicks" Mobility  Outcome Measure Help needed turning from your back to your side while in a flat bed without using bedrails?: A Lot Help needed moving from lying on your back to sitting on the side of a flat bed without using bedrails?: A Lot Help needed moving to and from a bed to a chair (including a wheelchair)?: A Little Help needed standing up from a chair using your arms (e.g., wheelchair or bedside chair)?: A Little Help needed to walk in hospital room?: A Little Help needed climbing 3-5 steps with a railing? : A Little 6 Click Score: 16    End of Session   Activity Tolerance: Patient tolerated treatment well Patient left: in bed;with call bell/phone within reach Nurse Communication: Mobility status PT Visit Diagnosis: Other abnormalities of gait and mobility (R26.89)    Time: 0272-5366 PT Time Calculation (min) (ACUTE ONLY): 14 min   Charges:   PT Evaluation $PT Eval Low Complexity: 1 Low   PT General Charges $$ ACUTE  PT VISIT: 1 Visit         Delphia Grates. Fairly IV, PT, DPT Physical Therapist- [place]  [facility]  08/06/2023, 1:14 PM

## 2023-08-06 NOTE — ED Notes (Signed)
Pt assisted to and from toilet in room. Pt independent in this task but RN remained as emotional support per pt request.   Patient now resting in bed free from sign of distress. Breathing unlabored speaking in full sentences with symmetric chest rise and fall. Assisted pt with positioning for comfort. Bed low and locked with side rails raised x2. Call bell in reach and monitor in place.

## 2023-08-06 NOTE — Progress Notes (Signed)
SLP Cancellation Note  Patient Details Name: [name] MRN: [id number] DOB: 1956/12/10   Cancelled treatment:       Reason Eval/Treat Not Completed: SLP screened, no needs identified, will sign off (chart reviewed; consulted NSG then met w/ pt in room.)  Pt was on her phone upon entering room. She denied any difficulty swallowing and is currently on a regular diet; tolerates swallowing pills w/ water per NSG. Half+ of the Lunch meal had been eaten.  Pt conversed in conversation w/out expressive/receptive deficits noted; pt denied any current speech-language deficits. Speech intelligible, clear. She clearly described the events leading to her admit. She also stated she wanted to go home "to pick up her blood pressure machine at the Pharmacy".   No further skilled ST services indicated as pt appears at her communication baseline. Pt agreed. NSG to reconsult if any change in status while admitted.     [name], MS, CCC-SLP Speech Language Pathologist Rehab Services; [facility] [phone] (ascom) Esa Raden 08/06/2023, 2:55 PM

## 2023-08-06 NOTE — Progress Notes (Signed)
*  PRELIMINARY RESULTS* Echocardiogram 2D Echocardiogram has been performed.  Cristela Blue 08/06/2023, 8:51 AM

## 2023-08-07 ENCOUNTER — Other Ambulatory Visit (HOSPITAL_COMMUNITY): Payer: Self-pay

## 2023-08-07 ENCOUNTER — Other Ambulatory Visit: Payer: Self-pay

## 2023-08-10 ENCOUNTER — Telehealth: Payer: Self-pay

## 2023-08-10 ENCOUNTER — Other Ambulatory Visit: Payer: Self-pay

## 2023-08-10 NOTE — Telephone Encounter (Signed)
Copied from CRM [id number]. Topic: General - Other >> Aug 10, 2023  1:32 PM Phill Myron wrote: Ms. Mleczko would like if Dr Sherrie Mustache could prescribe her a new Blood Pressure Monitoring (OMRON 3 SERIES BP MONITOR) DEVICE. PLease advise

## 2023-08-11 ENCOUNTER — Other Ambulatory Visit: Payer: Self-pay | Admitting: Physical Medicine and Rehabilitation

## 2023-08-11 ENCOUNTER — Other Ambulatory Visit: Payer: Self-pay

## 2023-08-11 DIAGNOSIS — F419 Anxiety disorder, unspecified: Secondary | ICD-10-CM

## 2023-08-12 ENCOUNTER — Other Ambulatory Visit: Payer: Self-pay

## 2023-08-12 MED ORDER — ALPRAZOLAM 0.5 MG PO TABS
0.5000 mg | ORAL_TABLET | Freq: Two times a day (BID) | ORAL | 3 refills | Status: DC | PRN
Start: 2023-08-12 — End: 2024-02-12
  Filled 2023-08-12 – 2023-08-13 (×2): qty 180, 90d supply, fill #0

## 2023-08-13 ENCOUNTER — Other Ambulatory Visit: Payer: Self-pay

## 2023-08-13 NOTE — Progress Notes (Signed)
[name], [name] ([id number]) [phone] [email or web address] Page 1 of 5 Visit Report for 08/05/2023 Abuse Risk Screen Details Patient Name: Date of Service: [name], [name] 08/05/2023 9:30 [name] Medical Record Number: [id number] Patient Account Number: [id number] Date of Birth/Sex: Treating RN: [birth year] (67 y.o. [name] Primary Care [name]: [name] Other Clinician: Referring [name]: Treating [name]/Extender: [name] in Treatment: 0 Abuse Risk Screen Items Answer ABUSE RISK SCREEN: Has anyone close to you tried to hurt or harm you recentlyo No Do you feel uncomfortable with anyone in your familyo No Has anyone forced you do things that you didnt want to doo No Electronic Signature(s) Signed: 08/13/2023 11:59:03 AM By: Kimberly Pax RN Entered By: [name] on 08/05/2023 06:58:39 -------------------------------------------------------------------------------- Activities of Daily Living Details Patient Name: Date of Service: [name], [name] 08/05/2023 9:30 [name] Medical Record Number: [id number] Patient Account Number: [id number] Date of Birth/Sex: Treating RN: Nov 15, 1956 (67 y.o. [name] Primary Care [name]: [name] Other Clinician: Referring Emanuelle Bastos: Treating [name]/Extender: [name] in Treatment: 0 Activities of Daily Living Items Answer Activities of Daily Living (Please select one for each item) Drive Automobile Not Able T Medications ake Completely Able Use T elephone Completely Able Care for Appearance Completely Able Use T oilet Need Assistance Bath / Shower Need Assistance Dress Self Need Assistance Feed Self Completely Able Walk Need Assistance Get In / Out Bed Need Assistance Housework Not ESTI, VUNCANNON [name] ([id number]) [phone] [email or web address] Page 2 of 5 Prepare Meals Not Able Handle Money Need Assistance Shop for Self  Not Able Electronic Signature(s) Signed: 08/13/2023 11:59:03 AM By: Kimberly Pax RN Entered By: [name] on 08/05/2023 06:59:17 -------------------------------------------------------------------------------- Education Screening Details Patient Name: Date of Service: [name]. 08/05/2023 9:30 [name] Medical Record Number: [id number] Patient Account Number: [id number] Date of Birth/Sex: Treating RN: Apr 22, 1956 (67 y.o. [name] Primary Care Arlenis Blaydes: [name] Other Clinician: Referring [name]: Treating [name]/Extender: [name] in Treatment: 0 Primary Learner Assessed: Patient Learning Preferences/Education Level/Primary Language Learning Preference: Explanation Highest Education Level: High School Preferred Language: English Cognitive Barrier Language Barrier: No Translator Needed: No Memory Deficit: No Emotional Barrier: No Cultural/Religious Beliefs Affecting Medical Care: No Physical Barrier Impaired Vision: No Impaired Hearing: No Decreased Hand dexterity: No Knowledge/Comprehension Knowledge Level: Medium Comprehension Level: Medium Ability to understand written instructions: Medium Ability to understand verbal instructions: Medium Motivation Anxiety Level: Anxious Cooperation: Cooperative Education Importance: Acknowledges Need Interest in Health Problems: Asks Questions Perception: Coherent Willingness to Engage in Self-Management High Activities: Readiness to Engage in Self-Management High Activities: Electronic Signature(s) Signed: 08/13/2023 11:59:03 AM By: Kimberly Pax RN Entered By: [name] on 08/05/2023 06:59:54 [name] ([id number]) [id number] Page 3 of 5 -------------------------------------------------------------------------------- Fall Risk Assessment Details Patient Name: Date of Service: [name], [name] 08/05/2023 9:30 [name] Medical Record Number:  [id number] Patient Account Number: [id number] Date of Birth/Sex: Treating RN: 04-15-1956 (67 y.o. [name] Primary Care [name]: [name] Other Clinician: Referring [name]: Treating [name]/Extender: [name] in Treatment: 0 Fall Risk Assessment Items Have you had 2 or more falls in the last 12 monthso 0 Yes Have you had any fall that resulted in injury in the last 12 monthso 0 Yes FALLS RISK SCREEN History of falling - immediate or within 3 months 25 Yes Secondary diagnosis (Do you have 2 or more medical diagnoseso) 15 Yes Ambulatory aid None/bed rest/wheelchair/nurse 0  Yes Crutches/cane/walker 0 No Furniture 0 No Intravenous therapy Access/Saline/Heparin Lock 0 No Gait/Transferring Normal/ bed rest/ wheelchair 0 No Weak (short steps with or without shuffle, stooped but able to lift head while walking, may seek 10 Yes support from furniture) Impaired (short steps with shuffle, may have difficulty arising from chair, head down, impaired 0 No balance) Mental Status Oriented to own ability 0 Yes Electronic Signature(s) Signed: 08/13/2023 11:59:03 AM By: Kimberly Pax RN Entered By: [name] on 08/05/2023 07:00:37 -------------------------------------------------------------------------------- Foot Assessment Details Patient Name: Date of Service: [name]. 08/05/2023 9:30 [name] Medical Record Number: [id number] Patient Account Number: [id number] Date of Birth/Sex: Treating RN: [birth year] (67 y.o. [name] Primary Care [name]: [name] Other Clinician: Referring [name]: Treating [name]/Extender: [name] in Treatment: 0 Foot Assessment Items Site Locations [address], [place] [name] ([id number]) [id number] Page 4 of 5 + = Sensation present, - = Sensation absent, C = Callus, U = Ulcer R = Redness, W = Warmth, M = Maceration, PU = Pre-ulcerative lesion F  = Fissure, S = Swelling, D = Dryness Assessment Right: Left: Other Deformity: No No Prior Foot Ulcer: No No Prior Amputation: No No Charcot Joint: No No Ambulatory Status: Ambulatory With Help Assistance Device: Stretcher Gait: Surveyor, mining) Signed: 08/13/2023 11:59:03 AM By: Kimberly Pax RN Entered By: [name] on 08/05/2023 07:08:38 -------------------------------------------------------------------------------- Nutrition Risk Screening Details Patient Name: Date of Service: [name], [name] 08/05/2023 9:30 [name] Medical Record Number: [id number] Patient Account Number: [id number] Date of Birth/Sex: Treating RN: [birth year] (67 y.o. [name] Primary Care Jasreet Dickie: [name] Other Clinician: Referring Zedric Deroy: Treating Shandreka Dante/Extender: [name] in Treatment: 0 Height (in): 67 Weight (lbs): 162 Body Mass Index (BMI): 25.4 Nutrition Risk Screening Items Score Screening NUTRITION RISK SCREEN: I have an illness or condition that made me change the kind and/or amount of food I eat 0 No I eat fewer than two meals per day 0 No I eat few fruits and vegetables, or milk products 0 No I have three or more drinks of beer, liquor or wine almost every day 0 No I have tooth or mouth problems that make it hard for me to eat 0 No LANET, VANDEHEY [name] ([id number]) [phone] [email or web address] Page 5 of 5 I don't always have enough money to buy the food I need 0 No I eat alone most of the time 0 No I take three or more different prescribed or over-the-counter drugs [name] day 1 Yes Without wanting to, I have lost or gained 10 pounds in the last six months 0 No I am not always physically able to shop, cook and/or feed myself 2 Yes Nutrition Protocols Good Risk Protocol Moderate Risk Protocol 0 Provide education on nutrition High Risk Proctocol Risk Level: Moderate Risk Score: 3 Electronic Signature(s) Signed: 08/13/2023  11:59:03 AM By: Kimberly Pax RN Entered By: [name] on 08/05/2023 07:00:53

## 2023-08-13 NOTE — Progress Notes (Signed)
[name] ([id number]) [id number] Page 1 of 10 Visit Report for 08/05/2023 Allergy List Details Patient Name: Date of Service: [name], [name] 08/05/2023 9:30 [name] Medical Record Number: [id number] Patient Account Number: [id number] Date of Birth/Sex: Treating RN: [birth year] (67 y.o. Freddy Finner Primary Care Marketa Midkiff: Mila Merry Other Clinician: Referring Alleyah Twombly: Treating Betti Goodenow/Extender: Roney Mans Weeks in Treatment: 0 Allergies Active Allergies penicillin Allergy Notes Electronic Signature(s) Signed: 08/13/2023 11:59:03 AM By: Yevonne Pax RN Entered By: Yevonne Pax on 08/05/2023 06:56:15 -------------------------------------------------------------------------------- Arrival Information Details Patient Name: Date of Service: Kimberly Brooke [name]. 08/05/2023 9:30 [name] Medical Record Number: [id number] Patient Account Number: [id number] Date of Birth/Sex: Treating RN: 03-Jan-1956 (67 y.o. Freddy Finner Primary Care Analena Gama: Mila Merry Other Clinician: Referring Norbert Malkin: Treating Essie Lagunes/Extender: Carlynn Spry in Treatment: 0 Visit Information Patient Arrived: Wheel Chair Arrival Time: 09:52 Accompanied By: daughter Transfer Assistance: None Patient Identification Verified: Yes Secondary Verification Process Completed: Yes Patient Requires Transmission-Based Precautions: No Patient Has Alerts: Yes Patient Alerts: DIABETIC Electronic Signature(s) Signed: 08/13/2023 11:59:03 AM By: Yevonne Pax RN Entered By: Yevonne Pax on 08/05/2023 06:53:58 [name] ([id number]) [id number] Page 2 of 10 -------------------------------------------------------------------------------- Clinic Level of Care Assessment Details Patient Name: Date of Service: [name] 08/05/2023 9:30 [name] Medical Record Number: [id number] Patient Account Number:  [id number] Date of Birth/Sex: Treating RN: 06/28/1956 (67 y.o. Freddy Finner Primary Care Jaclyn Carew: Mila Merry Other Clinician: Referring Shavawn Stobaugh: Treating Zahmir Lalla/Extender: Carlynn Spry in Treatment: 0 Clinic Level of Care Assessment Items TOOL 2 Quantity Score X- 1 0 Use when only an EandM is performed on the INITIAL visit ASSESSMENTS - Nursing Assessment / Reassessment X- 1 20 General Physical Exam (combine w/ comprehensive assessment (listed just below) when performed on new pt. evals) X- 1 25 Comprehensive Assessment (HX, ROS, Risk Assessments, Wounds Hx, etc.) ASSESSMENTS - Wound and Skin [name] ssessment / Reassessment X - Simple Wound Assessment / Reassessment - one wound 1 5 []  - 0 Complex Wound Assessment / Reassessment - multiple wounds []  - 0 Dermatologic / Skin Assessment (not related to wound area) ASSESSMENTS - Ostomy and/or Continence Assessment and Care []  - 0 Incontinence Assessment and Management []  - 0 Ostomy Care Assessment and Management (repouching, etc.) PROCESS - Coordination of Care X - Simple Patient / Family Education for ongoing care 1 15 []  - 0 Complex (extensive) Patient / Family Education for ongoing care []  - 0 Staff obtains Chiropractor, Records, T Results / Process Orders est []  - 0 Staff telephones HHA, Nursing Homes / Clarify orders / etc []  - 0 Routine Transfer to another Facility (non-emergent condition) []  - 0 Routine Hospital Admission (non-emergent condition) []  - 0 New Admissions / Manufacturing engineer / Ordering NPWT Apligraf, etc. , []  - 0 Emergency Hospital Admission (emergent condition) X- 1 10 Simple Discharge Coordination []  - 0 Complex (extensive) Discharge Coordination PROCESS - Special Needs []  - 0 Pediatric / Minor Patient Management []  - 0 Isolation Patient Management []  - 0 Hearing / Language / Visual special needs []  - 0 Assessment of Community assistance (transportation, D/C  planning, etc.) []  - 0 Additional assistance / Altered mentation []  - 0 Support Surface(s) Assessment (bed, cushion, seat, etc.) INTERVENTIONS - Wound Cleansing / Measurement YURANI, GERMAIN [name] ([id number]) [id number] Page 3 of 10 X- 1 5 Wound Imaging (photographs - any number of wounds) []  - 0 Wound Tracing (instead of photographs) X- 1 5 Simple  Wound Measurement - one wound []  - 0 Complex Wound Measurement - multiple wounds X- 1 5 Simple Wound Cleansing - one wound []  - 0 Complex Wound Cleansing - multiple wounds INTERVENTIONS - Wound Dressings X - Small Wound Dressing one or multiple wounds 1 10 []  - 0 Medium Wound Dressing one or multiple wounds []  - 0 Large Wound Dressing one or multiple wounds []  - 0 Application of Medications - injection INTERVENTIONS - Miscellaneous []  - 0 External ear exam []  - 0 Specimen Collection (cultures, biopsies, blood, body fluids, etc.) []  - 0 Specimen(s) / Culture(s) sent or taken to Lab for analysis []  - 0 Patient Transfer (multiple staff / Nurse, adult / Similar devices) []  - 0 Simple Staple / Suture removal (25 or less) []  - 0 Complex Staple / Suture removal (26 or more) []  - 0 Hypo / Hyperglycemic Management (close monitor of Blood Glucose) X- 1 15 Ankle / Brachial Index (ABI) - do not check if billed separately Has the patient been seen at the hospital within the last three years: Yes Total Score: 115 Level Of Care: New/Established - Level 3 Electronic Signature(s) Signed: 08/13/2023 11:59:03 AM By: Yevonne Pax RN Entered By: Yevonne Pax on 08/05/2023 07:46:19 -------------------------------------------------------------------------------- Encounter Discharge Information Details Patient Name: Date of Service: Kimberly Brooke [name]. 08/05/2023 9:30 [name] Medical Record Number: [id number] Patient Account Number: [id number] Date of Birth/Sex: Treating RN: Jul 19, 1956 (67 y.o. Freddy Finner Primary Care  Lener Ventresca: Mila Merry Other Clinician: Referring Shawne Bulow: Treating Orton Capell/Extender: Carlynn Spry in Treatment: 0 Encounter Discharge Information Items Post Procedure Vitals Discharge Condition: Stable Temperature (F): 97.8 Ambulatory Status: Wheelchair Pulse (bpm): 81 Discharge Destination: Home Respiratory Rate (breaths/min): 18 Transportation: Private Auto Blood Pressure (mmHg): 195/100 Accompanied By: daughter Schedule Follow-up Appointment: Yes Clinical Summary of Care: OLANNA, SCHWERDT ([id number]) [id number] Page 4 of 10 Electronic Signature(s) Signed: 08/13/2023 11:59:03 AM By: Yevonne Pax RN Entered By: Yevonne Pax on 08/05/2023 07:47:19 -------------------------------------------------------------------------------- Lower Extremity Assessment Details Patient Name: Date of Service: TATUM, NOVOSEL 08/05/2023 9:30 [name] Medical Record Number: [id number] Patient Account Number: [id number] Date of Birth/Sex: Treating RN: 08/02/1956 (67 y.o. Freddy Finner Primary Care Babetta Paterson: Mila Merry Other Clinician: Referring Arlander Gillen: Treating Jessejames Steelman/Extender: Roney Mans Weeks in Treatment: 0 Edema Assessment Assessed: [Left: No] [Right: No] Edema: [Left: N] [Right: o] Calf Left: Right: Point of Measurement: 35 cm From Medial Instep 31 cm Ankle Left: Right: Point of Measurement: 10 cm From Medial Instep 18 cm Knee To Floor Left: Right: From Medial Instep 45 cm Vascular Assessment Pulses: Dorsalis Pedis Palpable: [Left:Yes] Doppler Audible: [Left:Yes] Extremity colors, hair growth, and conditions: Extremity Color: [Left:Normal] Hair Growth on Extremity: [Left:No] Temperature of Extremity: [Left:Warm] Capillary Refill: [Left:< 3 seconds] Dependent Rubor: [Left:No] Blanched when Elevated: [Left:No] Lipodermatosclerosis: [Left:No] Blood Pressure: Brachial:  [Left:195] Ankle: [Left:Dorsalis Pedis: 110 0.56] Toe Nail Assessment Left: Right: Thick: No Discolored: Yes Deformed: No Improper Length and Hygiene: No Notes patient very anxious , recheck next visit TESSALYN, DEST [name] ([id number]) [id number] Page 5 of 10 Electronic Signature(s) Signed: 08/13/2023 11:59:03 AM By: Yevonne Pax RN Entered By: Yevonne Pax on 08/05/2023 07:44:28 -------------------------------------------------------------------------------- Multi Wound Chart Details Patient Name: Date of Service: Kimberly Brooke [name]. 08/05/2023 9:30 [name] Medical Record Number: [id number] Patient Account Number: [id number] Date of Birth/Sex: Treating RN: September 24, 1956 (67 y.o. Freddy Finner Primary Care Rashad Auld: Mila Merry Other Clinician: Referring Arionna Hoggard: Treating Geovonni Meyerhoff/Extender: Roney Mans Weeks in Treatment: 0  Vital Signs Height(in): 67 Pulse(bpm): 81 Weight(lbs): 162 Blood Pressure(mmHg): 195/100 Body Mass Index(BMI): 25.4 Temperature(F): 97.8 Respiratory Rate(breaths/min): 18 [1:Photos:] [N/[name]:N/[name]] Left Calcaneus N/[name] N/[name] Wound Location: Gradually Appeared N/[name] N/[name] Wounding Event: Pressure Ulcer N/[name] N/[name] Primary Etiology: Coronary Artery Disease, N/[name] N/[name] Comorbid History: Hypertension, Type II Diabetes, Rheumatoid Arthritis 07/15/2023 N/[name] N/[name] Date Acquired: 0 N/[name] N/[name] Weeks of Treatment: Open N/[name] N/[name] Wound Status: No N/[name] N/[name] Wound Recurrence: 0.7x0.5x0.3 N/[name] N/[name] Measurements L x W x D (cm) 0.275 N/[name] N/[name] [name] (cm) : rea 0.082 N/[name] N/[name] Volume (cm) : Category/Stage III N/[name] N/[name] Classification: Medium N/[name] N/[name] Exudate [name] mount: Serosanguineous N/[name] N/[name] Exudate Type: red, brown N/[name] N/[name] Exudate Color: Large (67-100%) N/[name] N/[name] Granulation [name] mount: Pink N/[name] N/[name] Granulation Quality: Small (1-33%) N/[name] N/[name] Necrotic [name] mount: Eschar, Adherent Slough N/[name] N/[name] Necrotic Tissue: Fat Layer (Subcutaneous Tissue): Yes N/[name]  N/[name] Exposed Structures: Fascia: No Tendon: No Muscle: No Joint: No Bone: No None N/[name] N/[name] Epithelialization: Treatment Notes ALEXZA, KINA ([id number]) [id number] Page 6 of 10 Electronic Signature(s) Signed: 08/13/2023 11:59:03 AM By: Yevonne Pax RN Entered By: Yevonne Pax on 08/05/2023 07:20:36 -------------------------------------------------------------------------------- Multi-Disciplinary Care Plan Details Patient Name: Date of Service: Kimberly Brooke [name]. 08/05/2023 9:30 [name] Medical Record Number: [id number] Patient Account Number: [id number] Date of Birth/Sex: Treating RN: June 17, 1956 (67 y.o. Freddy Finner Primary Care Alicya Bena: Mila Merry Other Clinician: Referring Loany Neuroth: Treating Rowan Blaker/Extender: Carlynn Spry in Treatment: 0 Active Inactive Necrotic Tissue Nursing Diagnoses: Knowledge deficit related to management of necrotic/devitalized tissue Goals: Necrotic/devitalized tissue will be minimized in the wound bed Date Initiated: 08/05/2023 Target Resolution Date: 09/04/2023 Goal Status: Active Interventions: Assess patient pain level pre-, during and post procedure and prior to discharge Notes: Wound/Skin Impairment Nursing Diagnoses: Knowledge deficit related to ulceration/compromised skin integrity Goals: Patient/caregiver will verbalize understanding of skin care regimen Date Initiated: 08/05/2023 Target Resolution Date: 09/05/2023 Goal Status: Active Ulcer/skin breakdown will have [name] volume reduction of 30% by week 4 Date Initiated: 08/05/2023 Target Resolution Date: 09/05/2023 Goal Status: Active Ulcer/skin breakdown will have [name] volume reduction of 50% by week 8 Date Initiated: 08/05/2023 Target Resolution Date: 10/05/2023 Goal Status: Active Ulcer/skin breakdown will have [name] volume reduction of 80% by week 12 Date Initiated: 08/05/2023 Target Resolution Date: 11/05/2023 Goal Status:  Active Ulcer/skin breakdown will heal within 14 weeks Date Initiated: 08/05/2023 Target Resolution Date: 12/05/2023 Goal Status: Active Interventions: Assess patient/caregiver ability to obtain necessary supplies Assess patient/caregiver ability to perform ulcer/skin care regimen upon admission and as needed Assess ulceration(s) every visit Notes: Electronic Signature(s) SHONNON, DYBDAHL [name] ([id number]) [id number] Page 7 of 10 Signed: 08/13/2023 11:59:03 AM By: Yevonne Pax RN Entered By: Yevonne Pax on 08/05/2023 07:21:52 -------------------------------------------------------------------------------- Pain Assessment Details Patient Name: Date of Service: CELESTER, ARMENTEROS 08/05/2023 9:30 [name] Medical Record Number: [id number] Patient Account Number: [id number] Date of Birth/Sex: Treating RN: February 17, 1956 (67 y.o. Freddy Finner Primary Care Keiley Levey: Mila Merry Other Clinician: Referring Declan Adamson: Treating Minor Iden/Extender: Carlynn Spry in Treatment: 0 Active Problems Location of Pain Severity and Description of Pain Patient Has Paino Yes Site Locations With Dressing Change: Yes Duration of the Pain. Constant / Intermittento Intermittent How Long Does it Lasto Hours: Minutes: 10 Rate the pain. Current Pain Level: 0 Worst Pain Level: 2 Least Pain Level: 0 Tolerable Pain Level: 5 Character of Pain Describe the Pain: Aching Pain Management and Medication Current Pain Management: Medication: Yes Cold Application: No Rest: Yes Massage:  No Activity: No T.E.N.S.: No Heat Application: No Leg drop or elevation: No Is the Current Pain Management Adequate: Inadequate How does your wound impact your activities of daily livingo Sleep: No Bathing: No Appetite: No Relationship With Others: No Bladder Continence: No Emotions: No Bowel Continence: No Work: No Toileting: No Drive: No Dressing: No Hobbies: No Electronic  Signature(s) Signed: 08/13/2023 11:59:03 AM By: Yevonne Pax RN Entered By: Yevonne Pax on 08/05/2023 06:55:05 [name] ([id number]) [id number] Page 8 of 10 -------------------------------------------------------------------------------- Patient/Caregiver Education Details Patient Name: Date of Service: LARONNA, REDINGER 8/22/2024andnbsp9:30 [name] Medical Record Number: [id number] Patient Account Number: [id number] Date of Birth/Gender: Treating RN: Dec 05, 1956 (67 y.o. Freddy Finner Primary Care Physician: Mila Merry Other Clinician: Referring Physician: Treating Physician/Extender: Carlynn Spry in Treatment: 0 Education Assessment Education Provided To: Patient Education Topics Provided Pressure: Handouts: Pressure Injury: Prevention and Offloading Methods: Explain/Verbal Responses: State content correctly Welcome [facility]-New Patient Packet: o Handouts: Welcome [facility] o Methods: Explain/Verbal Responses: State content correctly Wound/Skin Impairment: Handouts: Caring for Your Ulcer Methods: Explain/Verbal Responses: State content correctly Electronic Signature(s) Signed: 08/13/2023 11:59:03 AM By: Yevonne Pax RN Entered By: Yevonne Pax on 08/05/2023 07:22:31 -------------------------------------------------------------------------------- Wound Assessment Details Patient Name: Date of Service: Kimberly Brooke [name]. 08/05/2023 9:30 [name] Medical Record Number: [id number] Patient Account Number: [id number] Date of Birth/Sex: Treating RN: May 08, 1956 (67 y.o. Freddy Finner Primary Care Dezmon Conover: Mila Merry Other Clinician: Referring Shonia Skilling: Treating Laaibah Wartman/Extender: Roney Mans Weeks in Treatment: 0 Wound Status Wound Number: 1 Primary Pressure Ulcer Etiology: Wound Location: Left Calcaneus Wound Open Wounding Event: Gradually JANEESE, KINKADE [name] ([id number]) [phone].pdf Page 9 of 10 Wounding Event: Gradually Appeared Status: Date Acquired: 07/15/2023 Comorbid Coronary Artery Disease, Hypertension, Type II Diabetes, Weeks Of Treatment: 0 History: Rheumatoid Arthritis Clustered Wound: No Photos Wound Measurements Length: (cm) 0.7 Width: (cm) 0.5 Depth: (cm) 0.3 Area: (cm) 0.275 Volume: (cm) 0.082 % Reduction in Area: % Reduction in Volume: Epithelialization: None Tunneling: No Undermining: No Wound Description Classification: Category/Stage III Exudate Amount: Medium Exudate Type: Serosanguineous Exudate Color: red, brown Foul Odor After Cleansing: No Slough/Fibrino Yes Wound Bed Granulation Amount: Large (67-100%) Exposed Structure Granulation Quality: Pink Fascia Exposed: No Necrotic Amount: Small (1-33%) Fat Layer (Subcutaneous Tissue) Exposed: Yes Necrotic Quality: Eschar, Adherent Slough Tendon Exposed: No Muscle Exposed: No Joint Exposed: No Bone Exposed: No Treatment Notes Wound #1 (Calcaneus) Wound Laterality: Left Cleanser Soap and Water Discharge Instruction: Gently cleanse wound with antibacterial soap, rinse and pat dry prior to dressing wounds Peri-Wound Care Topical Primary Dressing Xeroform-HBD 2x2 (in/in) Discharge Instruction: Apply Xeroform-HBD 2x2 (in/in) as directed Secondary Dressing Coverlet Latex-Free Fabric Adhesive Dressings Discharge Instruction: 1.5 x 2 Secured With Compression Wrap Compression Stockings Add-Ons Electronic Signature(s) Signed: 08/13/2023 11:59:03 AM By: Yevonne Pax RN Entered By: Yevonne Pax on 08/05/2023 07:18:16 [name] ([id number]) [id number] Page 10 of 10 -------------------------------------------------------------------------------- Vitals Details Patient Name: Date of Service: AALEYAH, LUNDIE 08/05/2023 9:30 [name] Medical Record Number: [id number] Patient Account Number:  [id number] Date of Birth/Sex: Treating RN: April 05, 1956 (67 y.o. Freddy Finner Primary Care Generoso Cropper: Mila Merry Other Clinician: Referring Haskell Rihn: Treating Janaya Broy/Extender: Carlynn Spry in Treatment: 0 Vital Signs Time Taken: 09:55 Temperature (F): 97.8 Height (in): 67 Pulse (bpm): 81 Source: Stated Respiratory Rate (breaths/min): 18 Weight (lbs): 162 Blood Pressure (mmHg): 195/100 Source: Stated Reference Range: 80 - 120 mg / dl  Body Mass Index (BMI): 25.4 Electronic Signature(s) Signed: 08/13/2023 11:59:03 AM By: Yevonne Pax RN Entered By: Yevonne Pax on 08/05/2023 06:55:38

## 2023-08-18 ENCOUNTER — Other Ambulatory Visit: Payer: Self-pay

## 2023-08-19 ENCOUNTER — Ambulatory Visit: Payer: PPO | Admitting: Physician Assistant

## 2023-08-24 ENCOUNTER — Ambulatory Visit: Payer: PPO | Admitting: Physician Assistant

## 2023-08-25 ENCOUNTER — Other Ambulatory Visit: Payer: Self-pay

## 2023-08-25 ENCOUNTER — Encounter: Payer: PPO | Attending: Physician Assistant | Admitting: Internal Medicine

## 2023-08-25 DIAGNOSIS — E1151 Type 2 diabetes mellitus with diabetic peripheral angiopathy without gangrene: Secondary | ICD-10-CM | POA: Insufficient documentation

## 2023-08-25 DIAGNOSIS — I69354 Hemiplegia and hemiparesis following cerebral infarction affecting left non-dominant side: Secondary | ICD-10-CM | POA: Insufficient documentation

## 2023-08-25 DIAGNOSIS — E11621 Type 2 diabetes mellitus with foot ulcer: Secondary | ICD-10-CM | POA: Insufficient documentation

## 2023-08-25 DIAGNOSIS — L89623 Pressure ulcer of left heel, stage 3: Secondary | ICD-10-CM | POA: Insufficient documentation

## 2023-08-25 DIAGNOSIS — I10 Essential (primary) hypertension: Secondary | ICD-10-CM | POA: Insufficient documentation

## 2023-08-25 DIAGNOSIS — M069 Rheumatoid arthritis, unspecified: Secondary | ICD-10-CM | POA: Diagnosis not present

## 2023-08-25 DIAGNOSIS — I2510 Atherosclerotic heart disease of native coronary artery without angina pectoris: Secondary | ICD-10-CM | POA: Diagnosis not present

## 2023-08-25 DIAGNOSIS — I69854 Hemiplegia and hemiparesis following other cerebrovascular disease affecting left non-dominant side: Secondary | ICD-10-CM | POA: Diagnosis not present

## 2023-08-26 ENCOUNTER — Other Ambulatory Visit: Payer: Self-pay

## 2023-08-27 NOTE — Progress Notes (Signed)
Wound Cleansing - one wound 1 5 []  - 0 Complex Wound Cleansing - multiple wounds X- 1 5 Wound Imaging (photographs - any number of wounds) []  - 0 Wound Tracing (instead of photographs) X- 1 5 Simple Wound Measurement - one wound []  - 0 Complex Wound Measurement - multiple wounds INTERVENTIONS - Wound Dressings X - Small Wound Dressing one or multiple wounds 1 10 []  - 0 Medium Wound Dressing one or multiple wounds []  - 0 Large Wound Dressing one or multiple wounds []  - 0 Application of Medications - topical []  - 0 Application of Medications - injection INTERVENTIONS - Miscellaneous []  - 0 External ear exam [name], [name] ([id number]) [id number] Page 3 of 9 []  - 0 Specimen Collection (cultures, biopsies, blood, body fluids, etc.) []  - 0 Specimen(s) / Culture(s) sent or taken to Lab for analysis []  - 0 Patient Transfer (multiple staff / Michiel Sites Lift / Similar devices) []  - 0 Simple Staple / Suture removal (25 or less) []  - 0 Complex Staple / Suture removal (26 or more) []  - 0 Hypo / Hyperglycemic Management (close monitor of Blood Glucose) []  - 0 Ankle / Brachial Index (ABI) - do not check if billed separately X- 1 5 Vital Signs Has the patient been seen at the hospital within the last three years: Yes Total Score: 75 Level Of Care: New/Established - Level 2 Electronic Signature(s) Signed: 08/27/2023 1:15:19 PM By: Yevonne Pax RN Entered By: Yevonne Pax on 08/25/2023 16:26:51 -------------------------------------------------------------------------------- Encounter Discharge Information Details Patient Name: Date of Service: Kimberly Brooke [name]. 08/25/2023 3:45 PM Medical Record Number: [id number] Patient Account  Number: [id number] Date of Birth/Sex: Treating RN: 1956-03-09 (67 y.o. Freddy Finner Primary Care Carie Kapuscinski: Mila Merry Other Clinician: Referring Noa Constante: Treating Delora Gravatt/Extender: Blanch Media in Treatment: 2 Encounter Discharge Information Items Discharge Condition: Stable Ambulatory Status: Ambulatory Discharge Destination: Home Transportation: Private Auto Accompanied By: self Schedule Follow-up Appointment: Yes Clinical Summary of Care: Electronic Signature(s) Signed: 08/27/2023 1:15:19 PM By: Yevonne Pax RN Entered By: Yevonne Pax on 08/25/2023 16:27:30 -------------------------------------------------------------------------------- Lower Extremity Assessment Details Patient Name: Date of Service: [name], [name] 08/25/2023 3:45 PM [name] ([id number]) [id number] Page 4 of 9 Medical Record Number: [id number] Patient Account Number: [id number] Date of Birth/Sex: Treating RN: [birth year] (67 y.o. Sharyne Peach, Carrie Primary Care Leevon Upperman: Mila Merry Other Clinician: Referring Sharlyn Odonnel: Treating Deneshia Zucker/Extender: Blanch Media in Treatment: 2 Edema Assessment Assessed: Kyra Searles: No] Franne Forts: No] Edema: [Left: Ye] [Right: s] Calf Left: Right: Point of Measurement: 35 cm From Medial Instep 30 cm Ankle Left: Right: Point of Measurement: 10 cm From Medial Instep 18 cm Knee To Floor Left: Right: From Medial Instep 46 cm Vascular Assessment Pulses: Dorsalis Pedis Palpable: [Left:Yes] Extremity colors, hair growth, and conditions: Extremity Color: [Left:Normal] Hair Growth on Extremity: [Left:No] Temperature of Extremity: [Left:Warm] Capillary Refill: [Left:< 3 seconds] Dependent Rubor: [Left:No] Blanched when Elevated: [Left:No] Lipodermatosclerosis: [Left:No] Blood Pressure: Brachial: [Left:183] Ankle: [Left:Dorsalis Pedis: 130 0.71] Toe Nail Assessment Left:  Right: Thick: No Discolored: Yes Deformed: Yes Improper Length and Hygiene: Yes Electronic Signature(s) Signed: 08/27/2023 1:15:19 PM By: Yevonne Pax RN Entered By: Yevonne Pax on 08/25/2023 16:17:43 -------------------------------------------------------------------------------- Multi Wound Chart Details Patient Name: Date of Service: Kimberly Brooke [name]. 08/25/2023 3:45 PM Medical Record Number: [id number] Patient Account Number: [id number] Date of Birth/Sex: Treating RN: 13-Nov-1956 (67 y.o. Freddy Finner Primary Care Shauna Bodkins: Mila Merry Other Clinician: Referring Mamta Rimmer: Treating Ryn Peine/Extender: Danise Mina,  Wound Cleansing - one wound 1 5 []  - 0 Complex Wound Cleansing - multiple wounds X- 1 5 Wound Imaging (photographs - any number of wounds) []  - 0 Wound Tracing (instead of photographs) X- 1 5 Simple Wound Measurement - one wound []  - 0 Complex Wound Measurement - multiple wounds INTERVENTIONS - Wound Dressings X - Small Wound Dressing one or multiple wounds 1 10 []  - 0 Medium Wound Dressing one or multiple wounds []  - 0 Large Wound Dressing one or multiple wounds []  - 0 Application of Medications - topical []  - 0 Application of Medications - injection INTERVENTIONS - Miscellaneous []  - 0 External ear exam [name], [name] ([id number]) [id number] Page 3 of 9 []  - 0 Specimen Collection (cultures, biopsies, blood, body fluids, etc.) []  - 0 Specimen(s) / Culture(s) sent or taken to Lab for analysis []  - 0 Patient Transfer (multiple staff / Michiel Sites Lift / Similar devices) []  - 0 Simple Staple / Suture removal (25 or less) []  - 0 Complex Staple / Suture removal (26 or more) []  - 0 Hypo / Hyperglycemic Management (close monitor of Blood Glucose) []  - 0 Ankle / Brachial Index (ABI) - do not check if billed separately X- 1 5 Vital Signs Has the patient been seen at the hospital within the last three years: Yes Total Score: 75 Level Of Care: New/Established - Level 2 Electronic Signature(s) Signed: 08/27/2023 1:15:19 PM By: Yevonne Pax RN Entered By: Yevonne Pax on 08/25/2023 16:26:51 -------------------------------------------------------------------------------- Encounter Discharge Information Details Patient Name: Date of Service: Kimberly Brooke [name]. 08/25/2023 3:45 PM Medical Record Number: [id number] Patient Account  Number: [id number] Date of Birth/Sex: Treating RN: 1956-03-09 (67 y.o. Freddy Finner Primary Care Carie Kapuscinski: Mila Merry Other Clinician: Referring Noa Constante: Treating Delora Gravatt/Extender: Blanch Media in Treatment: 2 Encounter Discharge Information Items Discharge Condition: Stable Ambulatory Status: Ambulatory Discharge Destination: Home Transportation: Private Auto Accompanied By: self Schedule Follow-up Appointment: Yes Clinical Summary of Care: Electronic Signature(s) Signed: 08/27/2023 1:15:19 PM By: Yevonne Pax RN Entered By: Yevonne Pax on 08/25/2023 16:27:30 -------------------------------------------------------------------------------- Lower Extremity Assessment Details Patient Name: Date of Service: [name], [name] 08/25/2023 3:45 PM [name] ([id number]) [id number] Page 4 of 9 Medical Record Number: [id number] Patient Account Number: [id number] Date of Birth/Sex: Treating RN: [birth year] (67 y.o. Sharyne Peach, Carrie Primary Care Leevon Upperman: Mila Merry Other Clinician: Referring Sharlyn Odonnel: Treating Deneshia Zucker/Extender: Blanch Media in Treatment: 2 Edema Assessment Assessed: Kyra Searles: No] Franne Forts: No] Edema: [Left: Ye] [Right: s] Calf Left: Right: Point of Measurement: 35 cm From Medial Instep 30 cm Ankle Left: Right: Point of Measurement: 10 cm From Medial Instep 18 cm Knee To Floor Left: Right: From Medial Instep 46 cm Vascular Assessment Pulses: Dorsalis Pedis Palpable: [Left:Yes] Extremity colors, hair growth, and conditions: Extremity Color: [Left:Normal] Hair Growth on Extremity: [Left:No] Temperature of Extremity: [Left:Warm] Capillary Refill: [Left:< 3 seconds] Dependent Rubor: [Left:No] Blanched when Elevated: [Left:No] Lipodermatosclerosis: [Left:No] Blood Pressure: Brachial: [Left:183] Ankle: [Left:Dorsalis Pedis: 130 0.71] Toe Nail Assessment Left:  Right: Thick: No Discolored: Yes Deformed: Yes Improper Length and Hygiene: Yes Electronic Signature(s) Signed: 08/27/2023 1:15:19 PM By: Yevonne Pax RN Entered By: Yevonne Pax on 08/25/2023 16:17:43 -------------------------------------------------------------------------------- Multi Wound Chart Details Patient Name: Date of Service: Kimberly Brooke [name]. 08/25/2023 3:45 PM Medical Record Number: [id number] Patient Account Number: [id number] Date of Birth/Sex: Treating RN: 13-Nov-1956 (67 y.o. Freddy Finner Primary Care Shauna Bodkins: Mila Merry Other Clinician: Referring Mamta Rimmer: Treating Ryn Peine/Extender: Danise Mina,  [name] ([id number]) [id number] Page 5 of 9 Weeks in Treatment: 2 Vital Signs Height(in): 67 Pulse(bpm): 84 Weight(lbs): 162 Blood Pressure(mmHg): 183/83 Body Mass Index(BMI): 25.4 Temperature(F): 98.1 Respiratory Rate(breaths/min): 18 [1:Photos:] [N/[name]:N/[name]] Left Calcaneus N/[name] N/[name] Wound Location: Gradually Appeared N/[name] N/[name] Wounding Event: Pressure Ulcer N/[name] N/[name] Primary Etiology: Coronary Artery Disease, N/[name] N/[name] Comorbid History: Hypertension, Type II Diabetes, Rheumatoid Arthritis 07/15/2023 N/[name] N/[name] Date Acquired: 2 N/[name] N/[name] Weeks of Treatment: Open N/[name] N/[name] Wound Status: No N/[name] N/[name] Wound Recurrence: 0.6x0.4x0.3 N/[name] N/[name] Measurements L x W x D (cm) 0.188 N/[name] N/[name] [name] (cm) : rea 0.057 N/[name] N/[name] Volume (cm) : 31.60% N/[name] N/[name] % Reduction in [name] rea: 30.50% N/[name] N/[name] % Reduction in Volume: Category/Stage III N/[name] N/[name] Classification: Medium N/[name] N/[name] Exudate [name] mount: Serosanguineous N/[name] N/[name] Exudate Type: red, brown N/[name] N/[name] Exudate Color: Large (67-100%) N/[name] N/[name] Granulation [name] mount: Pink N/[name] N/[name] Granulation Quality: Small (1-33%) N/[name] N/[name] Necrotic [name] mount: Eschar, Adherent Slough N/[name] N/[name] Necrotic Tissue: Fat Layer (Subcutaneous Tissue): Yes N/[name] N/[name] Exposed Structures: Fascia: No Tendon:  No Muscle: No Joint: No Bone: No None N/[name] N/[name] Epithelialization: Treatment Notes Electronic Signature(s) Signed: 08/27/2023 1:15:19 PM By: Yevonne Pax RN Entered By: Yevonne Pax on 08/25/2023 16:10:27 -------------------------------------------------------------------------------- Multi-Disciplinary Care Plan Details Patient Name: Date of Service: Kimberly Brooke [name]. 08/25/2023 3:45 PM Medical Record Number: [id number] Patient Account Number: [id number] Date of Birth/Sex: Treating RN: [birth year] (67 y.o. Freddy Finner Primary Care Magda Muise: Mila Merry Other Clinician: Orlando Penner ([id number]) [id number] Page 6 of 9 Referring Marcques Wrightsman: Treating Yale Golla/Extender: Blanch Media in Treatment: 2 Active Inactive Necrotic Tissue Nursing Diagnoses: Knowledge deficit related to management of necrotic/devitalized tissue Goals: Necrotic/devitalized tissue will be minimized in the wound bed Date Initiated: 08/05/2023 Target Resolution Date: 09/04/2023 Goal Status: Active Interventions: Assess patient pain level pre-, during and post procedure and prior to discharge Notes: Wound/Skin Impairment Nursing Diagnoses: Knowledge deficit related to ulceration/compromised skin integrity Goals: Patient/caregiver will verbalize understanding of skin care regimen Date Initiated: 08/05/2023 Target Resolution Date: 09/05/2023 Goal Status: Active Ulcer/skin breakdown will have [name] volume reduction of 30% by week 4 Date Initiated: 08/05/2023 Target Resolution Date: 09/05/2023 Goal Status: Active Ulcer/skin breakdown will have [name] volume reduction of 50% by week 8 Date Initiated: 08/05/2023 Target Resolution Date: 10/05/2023 Goal Status: Active Ulcer/skin breakdown will have [name] volume reduction of 80% by week 12 Date Initiated: 08/05/2023 Target Resolution Date: 11/05/2023 Goal Status: Active Ulcer/skin breakdown will heal within 14 weeks Date  Initiated: 08/05/2023 Target Resolution Date: 12/05/2023 Goal Status: Active Interventions: Assess patient/caregiver ability to obtain necessary supplies Assess patient/caregiver ability to perform ulcer/skin care regimen upon admission and as needed Assess ulceration(s) every visit Notes: Electronic Signature(s) Signed: 08/27/2023 1:15:19 PM By: Yevonne Pax RN Entered By: Yevonne Pax on 08/25/2023 16:10:34 -------------------------------------------------------------------------------- Pain Assessment Details Patient Name: Date of Service: [name], [name] 08/25/2023 3:45 PM Medical Record Number: [id number] Patient Account Number: [id number] Date of Birth/Sex: Treating RN: [birth year] (67 y.o. Freddy Finner Primary Care Tomeshia Pizzi: Mila Merry Other Clinician: Referring Jakel Alphin: Treating Samaya Boardley/Extender: Danise Mina, Paul Half ([id number]) [id number] Page 7 of 9 Weeks in Treatment: 2 Active Problems Location of Pain Severity and Description of Pain Patient Has Paino No Site Locations Pain Management and Medication Current Pain Management: Electronic Signature(s) Signed: 08/27/2023 1:15:19 PM By: Yevonne Pax RN Entered By: Yevonne Pax on 08/25/2023 16:05:40 -------------------------------------------------------------------------------- Patient/Caregiver Education Details Patient Name: Date of Service: Kimberly Brooke  [name] ([id number]) [id number] Page 5 of 9 Weeks in Treatment: 2 Vital Signs Height(in): 67 Pulse(bpm): 84 Weight(lbs): 162 Blood Pressure(mmHg): 183/83 Body Mass Index(BMI): 25.4 Temperature(F): 98.1 Respiratory Rate(breaths/min): 18 [1:Photos:] [N/[name]:N/[name]] Left Calcaneus N/[name] N/[name] Wound Location: Gradually Appeared N/[name] N/[name] Wounding Event: Pressure Ulcer N/[name] N/[name] Primary Etiology: Coronary Artery Disease, N/[name] N/[name] Comorbid History: Hypertension, Type II Diabetes, Rheumatoid Arthritis 07/15/2023 N/[name] N/[name] Date Acquired: 2 N/[name] N/[name] Weeks of Treatment: Open N/[name] N/[name] Wound Status: No N/[name] N/[name] Wound Recurrence: 0.6x0.4x0.3 N/[name] N/[name] Measurements L x W x D (cm) 0.188 N/[name] N/[name] [name] (cm) : rea 0.057 N/[name] N/[name] Volume (cm) : 31.60% N/[name] N/[name] % Reduction in [name] rea: 30.50% N/[name] N/[name] % Reduction in Volume: Category/Stage III N/[name] N/[name] Classification: Medium N/[name] N/[name] Exudate [name] mount: Serosanguineous N/[name] N/[name] Exudate Type: red, brown N/[name] N/[name] Exudate Color: Large (67-100%) N/[name] N/[name] Granulation [name] mount: Pink N/[name] N/[name] Granulation Quality: Small (1-33%) N/[name] N/[name] Necrotic [name] mount: Eschar, Adherent Slough N/[name] N/[name] Necrotic Tissue: Fat Layer (Subcutaneous Tissue): Yes N/[name] N/[name] Exposed Structures: Fascia: No Tendon:  No Muscle: No Joint: No Bone: No None N/[name] N/[name] Epithelialization: Treatment Notes Electronic Signature(s) Signed: 08/27/2023 1:15:19 PM By: Yevonne Pax RN Entered By: Yevonne Pax on 08/25/2023 16:10:27 -------------------------------------------------------------------------------- Multi-Disciplinary Care Plan Details Patient Name: Date of Service: Kimberly Brooke [name]. 08/25/2023 3:45 PM Medical Record Number: [id number] Patient Account Number: [id number] Date of Birth/Sex: Treating RN: [birth year] (67 y.o. Freddy Finner Primary Care Magda Muise: Mila Merry Other Clinician: Orlando Penner ([id number]) [id number] Page 6 of 9 Referring Marcques Wrightsman: Treating Yale Golla/Extender: Blanch Media in Treatment: 2 Active Inactive Necrotic Tissue Nursing Diagnoses: Knowledge deficit related to management of necrotic/devitalized tissue Goals: Necrotic/devitalized tissue will be minimized in the wound bed Date Initiated: 08/05/2023 Target Resolution Date: 09/04/2023 Goal Status: Active Interventions: Assess patient pain level pre-, during and post procedure and prior to discharge Notes: Wound/Skin Impairment Nursing Diagnoses: Knowledge deficit related to ulceration/compromised skin integrity Goals: Patient/caregiver will verbalize understanding of skin care regimen Date Initiated: 08/05/2023 Target Resolution Date: 09/05/2023 Goal Status: Active Ulcer/skin breakdown will have [name] volume reduction of 30% by week 4 Date Initiated: 08/05/2023 Target Resolution Date: 09/05/2023 Goal Status: Active Ulcer/skin breakdown will have [name] volume reduction of 50% by week 8 Date Initiated: 08/05/2023 Target Resolution Date: 10/05/2023 Goal Status: Active Ulcer/skin breakdown will have [name] volume reduction of 80% by week 12 Date Initiated: 08/05/2023 Target Resolution Date: 11/05/2023 Goal Status: Active Ulcer/skin breakdown will heal within 14 weeks Date  Initiated: 08/05/2023 Target Resolution Date: 12/05/2023 Goal Status: Active Interventions: Assess patient/caregiver ability to obtain necessary supplies Assess patient/caregiver ability to perform ulcer/skin care regimen upon admission and as needed Assess ulceration(s) every visit Notes: Electronic Signature(s) Signed: 08/27/2023 1:15:19 PM By: Yevonne Pax RN Entered By: Yevonne Pax on 08/25/2023 16:10:34 -------------------------------------------------------------------------------- Pain Assessment Details Patient Name: Date of Service: [name], [name] 08/25/2023 3:45 PM Medical Record Number: [id number] Patient Account Number: [id number] Date of Birth/Sex: Treating RN: [birth year] (67 y.o. Freddy Finner Primary Care Tomeshia Pizzi: Mila Merry Other Clinician: Referring Jakel Alphin: Treating Samaya Boardley/Extender: Danise Mina, Paul Half ([id number]) [id number] Page 7 of 9 Weeks in Treatment: 2 Active Problems Location of Pain Severity and Description of Pain Patient Has Paino No Site Locations Pain Management and Medication Current Pain Management: Electronic Signature(s) Signed: 08/27/2023 1:15:19 PM By: Yevonne Pax RN Entered By: Yevonne Pax on 08/25/2023 16:05:40 -------------------------------------------------------------------------------- Patient/Caregiver Education Details Patient Name: Date of Service: Kimberly Brooke

## 2023-08-30 ENCOUNTER — Other Ambulatory Visit: Payer: Self-pay

## 2023-09-02 ENCOUNTER — Ambulatory Visit: Payer: PPO | Admitting: Physician Assistant

## 2023-09-09 ENCOUNTER — Encounter: Payer: PPO | Admitting: Physician Assistant

## 2023-09-09 DIAGNOSIS — L89623 Pressure ulcer of left heel, stage 3: Secondary | ICD-10-CM | POA: Diagnosis not present

## 2023-09-09 DIAGNOSIS — E11621 Type 2 diabetes mellitus with foot ulcer: Secondary | ICD-10-CM | POA: Diagnosis not present

## 2023-09-09 NOTE — Progress Notes (Addendum)
[name] ([id number]) [id number] Page 1 of 7 Visit Report for 09/09/2023 Chief Complaint Document Details Patient Name: Date of Service: [name], [name] 09/09/2023 12:15 PM Medical Record Number: [id number] Patient Account Number: [id number] Date of Birth/Sex: Treating RN: 05-08-1956 (67 y.o. [name] Primary Care Provider: Mila Merry Other Clinician: Referring Provider: Treating Provider/Extender: Reinaldo Raddle in Treatment: 5 Information Obtained from: Patient Chief Complaint Stage 3 left heel ulcer Electronic Signature(s) Signed: 09/09/2023 12:43:26 PM By: Allen Derry PA-C Entered By: Allen Derry on 09/09/2023 09:43:26 -------------------------------------------------------------------------------- Debridement Details Patient Name: Date of Service: Kimberly Brooke [name]. 09/09/2023 12:15 PM Medical Record Number: [id number] Patient Account Number: [id number] Date of Birth/Sex: Treating RN: 1956/02/26 (66 y.o. [name] Primary Care Provider: Mila Merry Other Clinician: Referring Provider: Treating Provider/Extender: Reinaldo Raddle in Treatment: 5 Debridement Performed for Assessment: Wound #1 Left Calcaneus Performed By: Physician Allen Derry, PA-C Debridement Type: Debridement Level of Consciousness (Pre-procedure): Awake and Alert Pre-procedure Verification/Time Out Yes - 12:51 Taken: Start Time: 12:51 Percent of Wound Bed Debrided: 100% T Area Debrided (cm): otal 0.07 Tissue and other material debrided: Viable, Non-Viable, Callus, Slough, Subcutaneous, Slough Level: Skin/Subcutaneous Tissue Debridement Description: Excisional Instrument: Curette Bleeding: Minimum Hemostasis Achieved: Pressure Procedural Pain: 0 Post Procedural Pain: 0 Response to Treatment: Procedure was tolerated well Level of Consciousness [name] ([id number])  [id number] Page 2 of 7 Level of Consciousness (Post- Awake and Alert procedure): Post Debridement Measurements of Total Wound Length: (cm) 0.3 Stage: Category/Stage III Width: (cm) 0.3 Depth: (cm) 0.2 Volume: (cm) 0.014 Character of Wound/Ulcer Post Debridement: Stable Post Procedure Diagnosis Same as Pre-procedure Electronic Signature(s) Signed: 09/10/2023 12:30:42 PM By: Midge Aver MSN RN CNS WTA Signed: 09/10/2023 2:21:22 PM By: Allen Derry PA-C Entered By: Midge Aver on 09/09/2023 09:52:53 -------------------------------------------------------------------------------- HPI Details Patient Name: Date of Service: Kimberly Brooke [name]. 09/09/2023 12:15 PM Medical Record Number: [id number] Patient Account Number: [id number] Date of Birth/Sex: Treating RN: Feb 02, 1956 (67 y.o. [name] Primary Care Provider: Mila Merry Other Clinician: Referring Provider: Treating Provider/Extender: Reinaldo Raddle in Treatment: 5 History of Present Illness HPI Description: [phone] Upon evaluation today. Patient presents for initial inspection here in our clinic concerning [name] wound that began around August 1. This appears to be [name] pressure injury and the patient does have [name] history of stroke with left sided weakness. This wound is on the left side heel posteriorly. With that being said, she tell me that she's been trying to keep pressure off of the back of the heel at this point. with that being said there is somewhat painful and is not draining [name] whole lot but does have [name] lot of callous buildup and eschar. Patient has [name] history of diabetes, type 2, hypertension, having had [name] stroke with left sided residual weakness, and coronary artery disease. 9/11; patient presents for follow-up. She was admitted at last clinic visit and was started on Xeroform to the left heel wound. She tries to offload the heel by floating this with [name] pillow when sitting and in  the bed. She is using backless shoes. She currently denies signs of infection. 09-09-2023 upon evaluation today patient appears to be doing well currently in regard to her heel which is actually showing signs of improvement. Fortunately I do not see any evidence of worsening overall in fact it appeared to be somewhat dry but once removed this looks much better. Electronic Signature(s) Signed: 09/09/2023 12:58:35  PM By: Allen Derry PA-C Entered By: Allen Derry on 09/09/2023 09:58:35 [name] ([id number]) [id number] Page 3 of 7 -------------------------------------------------------------------------------- Physical Exam Details Patient Name: Date of Service: [name] 09/09/2023 12:15 PM Medical Record Number: [id number] Patient Account Number: [id number] Date of Birth/Sex: Treating RN: [birth year] (67 y.o. [name] Primary Care Provider: Mila Merry Other Clinician: Referring Provider: Treating Provider/Extender: Reinaldo Raddle in Treatment: 5 Constitutional Well-nourished and well-hydrated in no acute distress. Respiratory normal breathing without difficulty. Psychiatric this patient is able to make decisions and demonstrates good insight into disease process. Alert and Oriented x 3. pleasant and cooperative. Notes Upon inspection patient's wound bed actually showed signs of good granulation and epithelization at this point. Fortunately I do not see any signs of worsening overall and I believe that the patient is making good headway towards closure. Removing the necrotic eschar actually popped off fairly easily and underneath this appears to be healing quite nicely. Electronic Signature(s) Signed: 09/09/2023 12:58:56 PM By: Allen Derry PA-C Entered By: Allen Derry on 09/09/2023 09:58:56 -------------------------------------------------------------------------------- Physician Orders Details Patient Name: Date of  Service: Kimberly Brooke [name]. 09/09/2023 12:15 PM Medical Record Number: [id number] Patient Account Number: [id number] Date of Birth/Sex: Treating RN: [birth year] (67 y.o. 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 Bathing/ Shower/ Hygiene May shower; gently cleanse wound with antibacterial soap, rinse and pat dry prior to dressing wounds Edema Control - Lymphedema / Segmental Compressive Device / Other Elevate, Exercise Daily and [name] void Standing for Long Periods of Time. Elevate legs to the level of the heart and pump ankles as often as possible Elevate leg(s) parallel to the floor when sitting. Wound Treatment DEMESHIA, YOKE [name] ([id number]) [id number] Page 4 of 7 Wound #1 - Calcaneus Wound Laterality: Left Cleanser: Soap and Water Every Other Day/30 Days Discharge Instructions: Gently cleanse wound with antibacterial soap, rinse and pat dry prior to dressing wounds Prim Dressing: Honey: Activon Honey Gel, 25 (g) Tube ary Every Other Day/30 Days Secondary Dressing: Coverlet Latex-Free Fabric Adhesive Dressings Every Other Day/30 Days Discharge Instructions: 1.5 x 2 Electronic Signature(s) Signed: 09/10/2023 12:30:42 PM By: Midge Aver MSN RN CNS WTA Signed: 09/10/2023 2:21:22 PM By: Allen Derry PA-C Entered By: Midge Aver on 09/09/2023  09:54:58 -------------------------------------------------------------------------------- Problem List Details Patient Name: Date of Service: Kimberly Brooke [name]. 09/09/2023 12:15 PM Medical Record Number: [id number] Patient Account Number: [id number] Date of Birth/Sex: Treating RN: 1956/12/09 (67 y.o. [name] Primary Care Provider: Mila Merry Other Clinician: Referring Provider: Treating Provider/Extender: Reinaldo Raddle in Treatment: 5 Active Problems ICD-10 Encounter Code Description Active Date MDM Diagnosis [phone] Pressure ulcer of left heel, stage 3 08/05/2023 No Yes E11.621 Type 2 diabetes mellitus with foot ulcer 08/05/2023 No Yes I10 Essential (primary) hypertension 08/05/2023 No Yes I25.10 Atherosclerotic heart disease of native coronary artery without angina pectoris 08/05/2023 No Yes I69.854 Hemiplegia and hemiparesis following other cerebrovascular disease affecting 08/05/2023 No Yes left non-dominant side Inactive Problems Resolved Problems Electronic Signature(s) Signed: 09/09/2023 12:43:20 PM By: Larina Bras,  Saban Heinlen PA-C Entered By: Allen Derry on 09/09/2023 09:43:20 [name] ([id number]) [id number] Page 5 of 7 -------------------------------------------------------------------------------- Progress Note Details Patient Name: Date of Service: JACKQUELIN, SHANOR 09/09/2023 12:15 PM Medical Record Number: [id number] Patient Account Number: [id number] Date of Birth/Sex: Treating RN: Apr 18, 1956 (67 y.o. [name] Primary Care Provider: Mila Merry Other Clinician: Referring Provider: Treating Provider/Extender: Reinaldo Raddle in Treatment: 5 Subjective Chief Complaint Information obtained from Patient Stage 3 left heel ulcer History of Present Illness (HPI) 24401 Upon evaluation today. Patient presents for initial inspection here in our clinic concerning [name] wound that began around  August 1. This appears to be [name] pressure injury and the patient does have [name] history of stroke with left sided weakness. This wound is on the left side heel posteriorly. With that being said, she tell me that she's been trying to keep pressure off of the back of the heel at this point. with that being said there is somewhat painful and is not draining [name] whole lot but does have [name] lot of callous buildup and eschar. Patient has [name] history of diabetes, type 2, hypertension, having had [name] stroke with left sided residual weakness, and coronary artery disease. 9/11; patient presents for follow-up. She was admitted at last clinic visit and was started on Xeroform to the left heel wound. She tries to offload the heel by floating this with [name] pillow when sitting and in the bed. She is using backless shoes. She currently denies signs of infection. 09-09-2023 upon evaluation today patient appears to be doing well currently in regard to her heel which is actually showing signs of improvement. Fortunately I do not see any evidence of worsening overall in fact it appeared to be somewhat dry but once removed this looks much better. Objective Constitutional Well-nourished and well-hydrated in no acute distress. Vitals Time Taken: 12:26 PM, Height: 67 in, Weight: 162 lbs, BMI: 25.4, Temperature: 98.3 F, Pulse: 99 bpm, Respiratory Rate: 18 breaths/min, Blood Pressure: 196/100 mmHg. Respiratory normal breathing without difficulty. Psychiatric this patient is able to make decisions and demonstrates good insight into disease process. Alert and Oriented x 3. pleasant and cooperative. General Notes: Upon inspection patient's wound bed actually showed signs of good granulation and epithelization at this point. Fortunately I do not see any signs of worsening overall and I believe that the patient is making good headway towards closure. Removing the necrotic eschar actually popped off fairly easily and underneath this appears to  be healing quite nicely. Integumentary (Hair, Skin) Wound #1 status is Open. Original cause of wound was Gradually Appeared. The date acquired was: 07/15/2023. The wound has been in treatment 5 weeks. The wound is located on the Left Calcaneus. The wound measures 0.6cm length x 0.4cm width x 0.1cm depth; 0.188cm^2 area and 0.019cm^3 volume. There is Fat Layer (Subcutaneous Tissue) exposed. There is [name] medium amount of serosanguineous drainage noted. There is large (67-100%) pink granulation within the wound bed. There is [name] small (1-33%) amount of necrotic tissue within the wound bed including Eschar and Adherent Slough. Assessment Active Problems CARMIA, SKOK ([id number]) [id number] Page 6 of 7 ICD-10 Pressure ulcer of left heel, stage 3 Type 2 diabetes mellitus with foot ulcer Essential (primary) hypertension Atherosclerotic heart disease of native coronary artery without angina pectoris Hemiplegia and hemiparesis following other cerebrovascular disease affecting left non-dominant side Procedures Wound #1 Pre-procedure diagnosis of Wound #1 is [name] Pressure Ulcer located on the Left Calcaneus . There was  [name] Excisional Skin/Subcutaneous Tissue Debridement with [name] total area of 0.07 sq cm performed by Allen Derry, PA-C. With the following instrument(s): Curette to remove Viable and Non-Viable tissue/material. Material removed includes Callus, Subcutaneous Tissue, and Slough. No specimens were taken. [name] time out was conducted at 12:51, prior to the start of the procedure. [name] Minimum amount of bleeding was controlled with Pressure. The procedure was tolerated well with [name] pain level of 0 throughout and [name] pain level of 0 following the procedure. Post Debridement Measurements: 0.3cm length x 0.3cm width x 0.2cm depth; 0.014cm^3 volume. Post debridement Stage noted as Category/Stage III. Character of Wound/Ulcer Post Debridement is stable. Post procedure Diagnosis Wound #1:  Same as Pre-Procedure Plan Follow-up Appointments: Return Appointment in 1 week. Bathing/ Shower/ Hygiene: May shower; gently cleanse wound with antibacterial soap, rinse and pat dry prior to dressing wounds Edema Control - Lymphedema / Segmental Compressive Device / Other: Elevate, Exercise Daily and Avoid Standing for Long Periods of Time. Elevate legs to the level of the heart and pump ankles as often as possible Elevate leg(s) parallel to the floor when sitting. WOUND #1: - Calcaneus Wound Laterality: Left Cleanser: Soap and Water Every Other Day/30 Days Discharge Instructions: Gently cleanse wound with antibacterial soap, rinse and pat dry prior to dressing wounds Prim Dressing: Honey: Activon Honey Gel, 25 (g) Tube Every Other Day/30 Days ary Secondary Dressing: Coverlet Latex-Free Fabric Adhesive Dressings Every Other Day/30 Days Discharge Instructions: 1.5 x 2 1. I would recommend that we have the patient continue to monitor for any signs of infection or worsening. Based on what I am seeing I do believe she is really doing quite well. 2. I am going to recommend as well that the patient should continue with the Medihoney which I think is doing really well at this point. 3. She does have an appointment coming up for vascular I think is still good to get this checked but really I feel like she is making headway towards closure. We will see patient back for reevaluation in 1 week here in the clinic. If anything worsens or changes patient will contact our office for additional recommendations. Electronic Signature(s) Signed: 09/09/2023 12:59:59 PM By: Allen Derry PA-C Entered By: Allen Derry on 09/09/2023 09:59:59 -------------------------------------------------------------------------------- SuperBill Details Patient Name: Date of Service: [name] [place] 09/09/2023 Medical Record Number: [id number] Patient Account Number: [id number] Date of Birth/Sex: Treating RN: 1956-08-14 (67  y.o. [name] Primary Care Provider: Mila Merry Other Clinician: Referring Provider: Treating Provider/Extender: Reinaldo Raddle in Treatment: 5 SAVANNHA, AHONEN [name] ([id number]) [id number] Page 7 of 7 Diagnosis Coding ICD-10 Codes Code Description [phone] Pressure ulcer of left heel, stage 3 E11.621 Type 2 diabetes mellitus with foot ulcer I10 Essential (primary) hypertension I25.10 Atherosclerotic heart disease of native coronary artery without angina pectoris I69.854 Hemiplegia and hemiparesis following other cerebrovascular disease affecting left non-dominant side Facility Procedures : CPT4 Code: [id number] Description: 11042 - DEB SUBQ TISSUE 20 SQ CM/< ICD-10 Diagnosis Description L89.623 Pressure ulcer of left heel, stage 3 Modifier: Quantity: 1 Physician Procedures : CPT4 Code Description Modifier [id number] 11042 - WC PHYS SUBQ TISS 20 SQ CM ICD-10 Diagnosis Description L89.623 Pressure ulcer of left heel, stage 3 Quantity: 1 Electronic Signature(s) Signed: 09/09/2023 1:00:08 PM By: Allen Derry PA-C Entered By: Allen Derry on 09/09/2023 10:00:08

## 2023-09-10 NOTE — Progress Notes (Signed)
[name]. 09/09/2023 12:15 PM Medical Record Number: [id number] Patient Account Number: [id number] Date of Birth/Sex: Treating RN: [birth year] (67 y.o. Kimberly [name] Primary Care Kimberly [name]: Kimberly [name] Other Clinician: Referring Kimberly [name]: Treating Kimberly [name]/Extender: Kimberly [name] in Treatment: 5 Electronic Signature(s) Signed: 09/10/2023 12:30:42 PM By: Kimberly Aver MSN RN CNS Kimberly [name] [name] ([id number]) PM By: Kimberly Aver MSN RN CNS Kimberly [name] [phone].pdf Page 3 of 6 Signed: 09/10/2023 12:30:42 Entered By: Kimberly [name] on 09/09/2023 09:44:29 -------------------------------------------------------------------------------- Multi Wound Chart Details Patient Name: Date of Service: Kimberly [name], Kimberly [name] 09/09/2023 12:15 PM Medical Record Number: [id number] Patient Account Number: [id number] Date of Birth/Sex: Treating RN: 1956-10-31 (67 y.o. Kimberly [name] Primary Care Kimberly [name]: Kimberly [name] Other Clinician: Referring Kimberly [name]: Treating Kimberly [name]/Extender: Kimberly [name] in Treatment: 5 Vital Signs Height(in): 67 Pulse(bpm): 99 Weight(lbs): 162 Blood Pressure(mmHg): 196/100 Body Mass Index(BMI): 25.4 Temperature(F): 98.3 Respiratory Rate(breaths/min): 18 [1:Photos:] [N/[name]:N/[name]] Left Calcaneus N/[name] N/[name] Wound Location: Gradually Appeared N/[name] N/[name] Wounding Event: Pressure Ulcer N/[name] N/[name] Primary Etiology: Coronary Artery Disease, N/[name] N/[name] Comorbid History: Hypertension, Type II Diabetes, Rheumatoid Arthritis 07/15/2023 N/[name] N/[name] Date Acquired: 5 N/[name] N/[name] Weeks of Treatment: Open N/[name] N/[name] Wound Status: No N/[name] N/[name] Wound Recurrence: 0.6x0.4x0.1 N/[name] N/[name] Measurements L x W x D (cm) 0.188 N/[name] N/[name] [name] (cm)  : rea 0.019 N/[name] N/[name] Volume (cm) : 31.60% N/[name] N/[name] % Reduction in [name] rea: 76.80% N/[name] N/[name] % Reduction in Volume: Category/Stage III N/[name] N/[name] Classification: Medium N/[name] N/[name] Exudate [name] mount: Serosanguineous N/[name] N/[name] Exudate Type: red, brown N/[name] N/[name] Exudate Color: Large (67-100%) N/[name] N/[name] Granulation [name] mount: Pink N/[name] N/[name] Granulation Quality: Small (1-33%) N/[name] N/[name] Necrotic [name] mount: Eschar, Adherent Slough N/[name] N/[name] Necrotic Tissue: Fat Layer (Subcutaneous Tissue): Yes N/[name] N/[name] Exposed Structures: Fascia: No Tendon: No Muscle: No Joint: No Bone: No None N/[name] N/[name] Epithelialization: Treatment Notes Electronic Signature(s) Kimberly [name] [name] ([id number]) [id number] Page 4 of 6 Signed: 09/10/2023 12:30:42 PM By: Kimberly Aver MSN RN CNS WTA Entered By: Kimberly [name] on 09/09/2023 09:44:57 -------------------------------------------------------------------------------- Pain Assessment Details Patient Name: Date of Service: Kimberly Brooke [name]. 09/09/2023 12:15 PM Medical Record Number: [id number] Patient Account Number: [id number] Date of Birth/Sex: Treating RN: Apr 24, 1956 (67 y.o. Kimberly [name] Primary Care Detavious Rinn: Kimberly [name] Other Clinician: Referring Jalene Demo: Treating Jann Milkovich/Extender: Kimberly [name] in Treatment: 5 Active Problems Location of Pain Severity and Description of Pain Patient Has Paino No Site Locations Pain Management and Medication Current Pain Management: Electronic Signature(s) Signed: 09/10/2023 12:30:42 PM By: Kimberly Aver MSN RN CNS WTA Entered By: Kimberly [name] on 09/09/2023 09:30:28 -------------------------------------------------------------------------------- Wound Assessment Details Patient Name: Date of Service: Kimberly Brooke [name]. 09/09/2023 12:15 PM Medical Record Number: [id number] Patient Account Number: [id number] Kimberly [name], Kimberly [name] ([id number]) [id number] Page 5 of 6 Date  of Birth/Sex: Treating RN: 1955-12-15 (68 y.o. Kimberly [name] Primary Care Demario Faniel: Other Clinician: Mila [name] Referring Harleigh Civello: Treating Kimberly [name]/Extender: Kimberly [name] in Treatment: 5 Wound Status Wound Number: 1 Primary Pressure Ulcer Etiology: Wound Location: Left Calcaneus Wound Open Wounding Event: Gradually Appeared Status: Date Acquired: 07/15/2023 Comorbid Coronary Artery Disease, Hypertension, Type II Diabetes, Weeks Of Treatment: 5 History: Rheumatoid Arthritis Clustered Wound: No Photos Wound Measurements Length: (cm) 0.6 Width: (cm) 0.4 Depth: (cm) 0.1 Area: (cm) 0.188 Volume: (cm) 0.019 % Reduction in Area: 31.6% % Reduction in Volume: 76.8% Epithelialization: None Wound Description Classification: Category/Stage III Exudate Amount: Medium Exudate Type: Serosanguineous Exudate Color: red, brown Foul  [name]. 09/09/2023 12:15 PM Medical Record Number: [id number] Patient Account Number: [id number] Date of Birth/Sex: Treating RN: [birth year] (67 y.o. Kimberly [name] Primary Care Kimberly [name]: Kimberly [name] Other Clinician: Referring Kimberly [name]: Treating Kimberly [name]/Extender: Kimberly [name] in Treatment: 5 Electronic Signature(s) Signed: 09/10/2023 12:30:42 PM By: Kimberly Aver MSN RN CNS Kimberly [name] [name] ([id number]) PM By: Kimberly Aver MSN RN CNS Kimberly [name] [phone].pdf Page 3 of 6 Signed: 09/10/2023 12:30:42 Entered By: Kimberly [name] on 09/09/2023 09:44:29 -------------------------------------------------------------------------------- Multi Wound Chart Details Patient Name: Date of Service: Kimberly [name], Kimberly [name] 09/09/2023 12:15 PM Medical Record Number: [id number] Patient Account Number: [id number] Date of Birth/Sex: Treating RN: 1956-10-31 (67 y.o. Kimberly [name] Primary Care Kimberly [name]: Kimberly [name] Other Clinician: Referring Kimberly [name]: Treating Kimberly [name]/Extender: Kimberly [name] in Treatment: 5 Vital Signs Height(in): 67 Pulse(bpm): 99 Weight(lbs): 162 Blood Pressure(mmHg): 196/100 Body Mass Index(BMI): 25.4 Temperature(F): 98.3 Respiratory Rate(breaths/min): 18 [1:Photos:] [N/[name]:N/[name]] Left Calcaneus N/[name] N/[name] Wound Location: Gradually Appeared N/[name] N/[name] Wounding Event: Pressure Ulcer N/[name] N/[name] Primary Etiology: Coronary Artery Disease, N/[name] N/[name] Comorbid History: Hypertension, Type II Diabetes, Rheumatoid Arthritis 07/15/2023 N/[name] N/[name] Date Acquired: 5 N/[name] N/[name] Weeks of Treatment: Open N/[name] N/[name] Wound Status: No N/[name] N/[name] Wound Recurrence: 0.6x0.4x0.1 N/[name] N/[name] Measurements L x W x D (cm) 0.188 N/[name] N/[name] [name] (cm)  : rea 0.019 N/[name] N/[name] Volume (cm) : 31.60% N/[name] N/[name] % Reduction in [name] rea: 76.80% N/[name] N/[name] % Reduction in Volume: Category/Stage III N/[name] N/[name] Classification: Medium N/[name] N/[name] Exudate [name] mount: Serosanguineous N/[name] N/[name] Exudate Type: red, brown N/[name] N/[name] Exudate Color: Large (67-100%) N/[name] N/[name] Granulation [name] mount: Pink N/[name] N/[name] Granulation Quality: Small (1-33%) N/[name] N/[name] Necrotic [name] mount: Eschar, Adherent Slough N/[name] N/[name] Necrotic Tissue: Fat Layer (Subcutaneous Tissue): Yes N/[name] N/[name] Exposed Structures: Fascia: No Tendon: No Muscle: No Joint: No Bone: No None N/[name] N/[name] Epithelialization: Treatment Notes Electronic Signature(s) Kimberly [name] [name] ([id number]) [id number] Page 4 of 6 Signed: 09/10/2023 12:30:42 PM By: Kimberly Aver MSN RN CNS WTA Entered By: Kimberly [name] on 09/09/2023 09:44:57 -------------------------------------------------------------------------------- Pain Assessment Details Patient Name: Date of Service: Kimberly Brooke [name]. 09/09/2023 12:15 PM Medical Record Number: [id number] Patient Account Number: [id number] Date of Birth/Sex: Treating RN: Apr 24, 1956 (67 y.o. Kimberly [name] Primary Care Detavious Rinn: Kimberly [name] Other Clinician: Referring Jalene Demo: Treating Jann Milkovich/Extender: Kimberly [name] in Treatment: 5 Active Problems Location of Pain Severity and Description of Pain Patient Has Paino No Site Locations Pain Management and Medication Current Pain Management: Electronic Signature(s) Signed: 09/10/2023 12:30:42 PM By: Kimberly Aver MSN RN CNS WTA Entered By: Kimberly [name] on 09/09/2023 09:30:28 -------------------------------------------------------------------------------- Wound Assessment Details Patient Name: Date of Service: Kimberly Brooke [name]. 09/09/2023 12:15 PM Medical Record Number: [id number] Patient Account Number: [id number] Kimberly [name], Kimberly [name] ([id number]) [id number] Page 5 of 6 Date  of Birth/Sex: Treating RN: 1955-12-15 (68 y.o. Kimberly [name] Primary Care Demario Faniel: Other Clinician: Mila [name] Referring Harleigh Civello: Treating Kimberly [name]/Extender: Kimberly [name] in Treatment: 5 Wound Status Wound Number: 1 Primary Pressure Ulcer Etiology: Wound Location: Left Calcaneus Wound Open Wounding Event: Gradually Appeared Status: Date Acquired: 07/15/2023 Comorbid Coronary Artery Disease, Hypertension, Type II Diabetes, Weeks Of Treatment: 5 History: Rheumatoid Arthritis Clustered Wound: No Photos Wound Measurements Length: (cm) 0.6 Width: (cm) 0.4 Depth: (cm) 0.1 Area: (cm) 0.188 Volume: (cm) 0.019 % Reduction in Area: 31.6% % Reduction in Volume: 76.8% Epithelialization: None Wound Description Classification: Category/Stage III Exudate Amount: Medium Exudate Type: Serosanguineous Exudate Color: red, brown Foul  Kimberly [name], Kimberly [name] ([id number]) [id number] Page 1 of 6 Visit Report for 09/09/2023 Arrival Information Details Patient Name: Date of Service: Kimberly [name], Kimberly [name] 09/09/2023 12:15 PM Medical Record Number: [id number] Patient Account Number: [id number] Date of Birth/Sex: Treating RN: 12-26-1955 (66 y.o. Kimberly [name] Primary Care Aarish Rockers: Kimberly [name] Other Clinician: Referring Elkin Belfield: Treating Alexzandrea Normington/Extender: Kimberly [name] in Treatment: 5 Visit Information History Since Last Visit Added or deleted any medications: No Patient Arrived: Wheel Chair Any new allergies or adverse reactions: No Arrival Time: 12:25 Has Dressing in Place as Prescribed: Yes Accompanied By: self Pain Present Now: No Transfer Assistance: EasyPivot Patient Lift Patient Identification Verified: Yes Secondary Verification Process Completed: Yes Patient Requires Transmission-Based Precautions: No Patient Has Alerts: Yes Patient Alerts: DIABETIC Electronic Signature(s) Signed: 09/10/2023 12:30:42 PM By: Kimberly Aver MSN RN CNS WTA Entered By: Kimberly [name] on 09/09/2023 09:26:02 -------------------------------------------------------------------------------- Clinic Level of Care Assessment Details Patient Name: Date of Service: Kimberly Brooke [name]. 09/09/2023 12:15 PM Medical Record Number: [id number] Patient Account Number: [id number] Date of Birth/Sex: Treating RN: November 11, 1956 (67 y.o. Kimberly [name] Primary Care Naziyah Tieszen: Kimberly [name] Other Clinician: Referring Otto Caraway: Treating Carmilla Granville/Extender: Kimberly [name] in Treatment: 5 Clinic Level of Care Assessment Items TOOL 1 Quantity Score []  - 0 Use when EandM and Procedure is performed on INITIAL visit ASSESSMENTS - Nursing Assessment / Reassessment []  - 0 General Physical Exam (combine w/ comprehensive assessment (listed just below) when performed on new pt. evals) []  - 0 Comprehensive  Assessment (HX, ROS, Risk Assessments, Wounds Hx, etc.) ASSESSMENTS - Wound and Skin Assessment / Reassessment []  - 0 Dermatologic / Skin Assessment (not related to wound area) ASSESSMENTS - Ostomy and/or Continence Assessment and Care Kimberly [name], Kimberly [name] ([id number]) [id number] Page 2 of 6 []  - 0 Incontinence Assessment and Management []  - 0 Ostomy Care Assessment and Management (repouching, etc.) PROCESS - Coordination of Care []  - 0 Simple Patient / Family Education for ongoing care []  - 0 Complex (extensive) Patient / Family Education for ongoing care []  - 0 Staff obtains Chiropractor, Records, T Results / Process Orders est []  - 0 Staff telephones HHA, Nursing Homes / Clarify orders / etc []  - 0 Routine Transfer to another Facility (non-emergent condition) []  - 0 Routine Hospital Admission (non-emergent condition) []  - 0 New Admissions / Manufacturing engineer / Ordering NPWT Apligraf, etc. , []  - 0 Emergency Hospital Admission (emergent condition) PROCESS - Special Needs []  - 0 Pediatric / Minor Patient Management []  - 0 Isolation Patient Management []  - 0 Hearing / Language / Visual special needs []  - 0 Assessment of Community assistance (transportation, D/C planning, etc.) []  - 0 Additional assistance / Altered mentation []  - 0 Support Surface(s) Assessment (bed, cushion, seat, etc.) INTERVENTIONS - Miscellaneous []  - 0 External ear exam []  - 0 Patient Transfer (multiple staff / Nurse, adult / Similar devices) []  - 0 Simple Staple / Suture removal (25 or less) []  - 0 Complex Staple / Suture removal (26 or more) []  - 0 Hypo/Hyperglycemic Management (do not check if billed separately) []  - 0 Ankle / Brachial Index (ABI) - do not check if billed separately Has the patient been seen at the hospital within the last three years: Yes Total Score: 0 Level Of Care: ____ Electronic Signature(s) Signed: 09/10/2023 12:30:42 PM By: Kimberly Aver  MSN RN CNS WTA Entered By: Kimberly [name] on 09/09/2023 09:55:11 -------------------------------------------------------------------------------- Lower Extremity Assessment Details Patient Name: Date of Service: Kimberly Brooke

## 2023-09-14 ENCOUNTER — Encounter: Payer: PPO | Admitting: Physical Medicine and Rehabilitation

## 2023-09-16 ENCOUNTER — Ambulatory Visit: Payer: PPO | Admitting: Physician Assistant

## 2023-09-17 ENCOUNTER — Other Ambulatory Visit: Payer: Self-pay

## 2023-09-17 DIAGNOSIS — M5416 Radiculopathy, lumbar region: Secondary | ICD-10-CM | POA: Diagnosis not present

## 2023-09-17 MED ORDER — METHYLPREDNISOLONE 4 MG PO TBPK
ORAL_TABLET | ORAL | 0 refills | Status: DC
  Filled 2023-09-17: qty 21, 6d supply, fill #0

## 2023-09-23 ENCOUNTER — Encounter: Payer: PPO | Attending: Physician Assistant | Admitting: Physician Assistant

## 2023-09-23 DIAGNOSIS — E11621 Type 2 diabetes mellitus with foot ulcer: Secondary | ICD-10-CM | POA: Diagnosis not present

## 2023-09-23 DIAGNOSIS — M069 Rheumatoid arthritis, unspecified: Secondary | ICD-10-CM | POA: Diagnosis not present

## 2023-09-23 DIAGNOSIS — I10 Essential (primary) hypertension: Secondary | ICD-10-CM | POA: Insufficient documentation

## 2023-09-23 DIAGNOSIS — L89623 Pressure ulcer of left heel, stage 3: Secondary | ICD-10-CM | POA: Diagnosis not present

## 2023-09-23 DIAGNOSIS — I69354 Hemiplegia and hemiparesis following cerebral infarction affecting left non-dominant side: Secondary | ICD-10-CM | POA: Diagnosis not present

## 2023-09-23 DIAGNOSIS — I2510 Atherosclerotic heart disease of native coronary artery without angina pectoris: Secondary | ICD-10-CM | POA: Diagnosis not present

## 2023-09-23 DIAGNOSIS — E119 Type 2 diabetes mellitus without complications: Secondary | ICD-10-CM | POA: Diagnosis not present

## 2023-09-23 NOTE — Progress Notes (Addendum)
[name] ([id number]) [id number] Page 1 of 6 Visit Report for 09/23/2023 Chief Complaint Document Details Patient Name: Date of Service: [name], [name] 09/23/2023 12:00 PM Medical Record Number: [id number] Patient Account Number: [id number] Date of Birth/Sex: Treating RN: 1956/08/18 (67 y.o. [name] Primary Care Provider: Mila [name] Other Clinician: Referring Provider: Treating Provider/Extender: Reinaldo Raddle in Treatment: 7 Information Obtained from: Patient Chief Complaint Stage 3 left heel ulcer Electronic Signature(s) Signed: 09/23/2023 12:30:56 PM By: Allen Derry PA-C Entered By: Allen Derry on 09/23/2023 12:30:56 -------------------------------------------------------------------------------- HPI Details Patient Name: Date of Service: [name]. 09/23/2023 12:00 PM Medical Record Number: [id number] Patient Account Number: [id number] Date of Birth/Sex: Treating RN: [birth year] (67 y.o. [name] Primary Care Provider: Mila [name] Other Clinician: Referring Provider: Treating Provider/Extender: Reinaldo Raddle in Treatment: 7 History of Present Illness HPI Description: [phone] Upon evaluation today. Patient presents for initial inspection here in our clinic concerning [name] wound that began around August 1. This appears to be [name] pressure injury and the patient does have [name] history of stroke with left sided weakness. This wound is on the left side heel posteriorly. With that being said, she tell me that she's been trying to keep pressure off of the back of the heel at this point. with that being said there is somewhat painful and is not draining [name] whole lot but does have [name] lot of callous buildup and eschar. Patient has [name] history of diabetes, type 2, hypertension, having had [name] stroke with left sided residual weakness, and coronary artery disease. 9/11; patient presents for  follow-up. She was admitted at last clinic visit and was started on Xeroform to the left heel wound. She tries to offload the heel by floating this with [name] pillow when sitting and in the bed. She is using backless shoes. She currently denies signs of infection. 09-09-2023 upon evaluation today patient appears to be doing well currently in regard to her heel which is actually showing signs of improvement. Fortunately I do not see any evidence of worsening overall in fact it appeared to be somewhat dry but once removed this looks much better. 09-23-2023 upon evaluation today patient appears to be doing well currently in regard to her wound. She has been tolerating the dressing changes without complication. Fortunately there does not appear to be any signs of infection in fact upon evaluation it appears that the patient is actually completely healed. I am extremely pleased this was actually [name] surprise as I thought there was still [name] small opening when looking at the picture. [name], [name] ([id number]) [id number] Page 2 of 6 Electronic Signature(s) Signed: 09/23/2023 12:37:04 PM By: Allen Derry PA-C Entered By: Allen Derry on 09/23/2023 12:37:04 -------------------------------------------------------------------------------- Physical Exam Details Patient Name: Date of Service: [name], [name] 09/23/2023 12:00 PM Medical Record Number: [id number] Patient Account Number: [id number] Date of Birth/Sex: Treating RN: [birth year] (67 y.o. [name] Primary Care Provider: Mila [name] Other Clinician: Referring Provider: Treating Provider/Extender: Reinaldo Raddle in Treatment: 7 Constitutional Well-nourished and well-hydrated in no acute distress. Respiratory normal breathing without difficulty. Psychiatric this patient is able to make decisions and demonstrates good insight into disease process. Alert and Oriented x 3. pleasant and  cooperative. Notes Upon inspection patient's wound bed actually showed signs of good granulation epithelization at this point. Fortunately I do not see any evidence of worsening overall I do believe that the patient is making headway towards  [name] ([id number]) [id number] Page 1 of 6 Visit Report for 09/23/2023 Chief Complaint Document Details Patient Name: Date of Service: [name], [name] 09/23/2023 12:00 PM Medical Record Number: [id number] Patient Account Number: [id number] Date of Birth/Sex: Treating RN: 1956/08/18 (67 y.o. [name] Primary Care Provider: Mila [name] Other Clinician: Referring Provider: Treating Provider/Extender: Reinaldo Raddle in Treatment: 7 Information Obtained from: Patient Chief Complaint Stage 3 left heel ulcer Electronic Signature(s) Signed: 09/23/2023 12:30:56 PM By: Allen Derry PA-C Entered By: Allen Derry on 09/23/2023 12:30:56 -------------------------------------------------------------------------------- HPI Details Patient Name: Date of Service: [name]. 09/23/2023 12:00 PM Medical Record Number: [id number] Patient Account Number: [id number] Date of Birth/Sex: Treating RN: [birth year] (67 y.o. [name] Primary Care Provider: Mila [name] Other Clinician: Referring Provider: Treating Provider/Extender: Reinaldo Raddle in Treatment: 7 History of Present Illness HPI Description: [phone] Upon evaluation today. Patient presents for initial inspection here in our clinic concerning [name] wound that began around August 1. This appears to be [name] pressure injury and the patient does have [name] history of stroke with left sided weakness. This wound is on the left side heel posteriorly. With that being said, she tell me that she's been trying to keep pressure off of the back of the heel at this point. with that being said there is somewhat painful and is not draining [name] whole lot but does have [name] lot of callous buildup and eschar. Patient has [name] history of diabetes, type 2, hypertension, having had [name] stroke with left sided residual weakness, and coronary artery disease. 9/11; patient presents for  follow-up. She was admitted at last clinic visit and was started on Xeroform to the left heel wound. She tries to offload the heel by floating this with [name] pillow when sitting and in the bed. She is using backless shoes. She currently denies signs of infection. 09-09-2023 upon evaluation today patient appears to be doing well currently in regard to her heel which is actually showing signs of improvement. Fortunately I do not see any evidence of worsening overall in fact it appeared to be somewhat dry but once removed this looks much better. 09-23-2023 upon evaluation today patient appears to be doing well currently in regard to her wound. She has been tolerating the dressing changes without complication. Fortunately there does not appear to be any signs of infection in fact upon evaluation it appears that the patient is actually completely healed. I am extremely pleased this was actually [name] surprise as I thought there was still [name] small opening when looking at the picture. [name], [name] ([id number]) [id number] Page 2 of 6 Electronic Signature(s) Signed: 09/23/2023 12:37:04 PM By: Allen Derry PA-C Entered By: Allen Derry on 09/23/2023 12:37:04 -------------------------------------------------------------------------------- Physical Exam Details Patient Name: Date of Service: [name], [name] 09/23/2023 12:00 PM Medical Record Number: [id number] Patient Account Number: [id number] Date of Birth/Sex: Treating RN: [birth year] (67 y.o. [name] Primary Care Provider: Mila [name] Other Clinician: Referring Provider: Treating Provider/Extender: Reinaldo Raddle in Treatment: 7 Constitutional Well-nourished and well-hydrated in no acute distress. Respiratory normal breathing without difficulty. Psychiatric this patient is able to make decisions and demonstrates good insight into disease process. Alert and Oriented x 3. pleasant and  cooperative. Notes Upon inspection patient's wound bed actually showed signs of good granulation epithelization at this point. Fortunately I do not see any evidence of worsening overall I do believe that the patient is making headway towards  complete closure which is great news and in general I do believe that we are doing very well overall. Electronic Signature(s) Signed: 09/23/2023 12:37:22 PM By: Allen Derry PA-C Entered By: Allen Derry on 09/23/2023 12:37:22 -------------------------------------------------------------------------------- Physician Orders Details Patient Name: Date of Service: [name]. 09/23/2023 12:00 PM Medical Record Number: [id number] Patient Account Number: [id number] Date of Birth/Sex: Treating RN: 1956/09/01 (67 y.o. 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 Call us with any issues to healed area. Electronic Signature(s) Signed: 09/23/2023 4:17:03 PM By: Allen Derry PA-C Signed: 09/23/2023 4:28:09 PM By: Angelina Pih Entered By: Angelina Pih on 09/23/2023 12:35:14 -------------------------------------------------------------------------------- Problem List  Details Patient Name: Date of Service: [name]. 09/23/2023 12:00 PM Medical Record Number: [id number] Patient Account Number: [id number] Date of Birth/Sex: Treating RN: Nov 19, 1956 (67 y.o. [name] Primary Care Provider: Mila [name] Other Clinician: Referring Provider: Treating Provider/Extender: Reinaldo Raddle in Treatment: 7 Active Problems ICD-10 Encounter Code Description Active Date MDM Diagnosis [phone] Pressure ulcer of left heel, stage 3 08/05/2023 No Yes E11.621 Type 2 diabetes mellitus with foot ulcer 08/05/2023 No Yes I10 Essential (primary) hypertension 08/05/2023 No Yes I25.10 Atherosclerotic heart disease of native coronary artery without angina pectoris 08/05/2023 No Yes I69.854 Hemiplegia and hemiparesis following other cerebrovascular disease affecting 08/05/2023 No Yes left non-dominant side Inactive Problems Resolved Problems Electronic Signature(s) Signed: 09/23/2023 12:30:52 PM By: Allen Derry PA-C Entered By: Allen Derry on 09/23/2023 12:30:52 [name] ([id number]) [id number] Page 4 of 6 -------------------------------------------------------------------------------- Progress Note Details Patient Name: Date of Service: [name], [name] 09/23/2023 12:00 PM Medical Record Number: [id number] Patient Account Number: [id number] Date of Birth/Sex: Treating RN: 29-Aug-1956 (67 y.o. [name] Primary Care Provider: Mila [name] Other Clinician: Referring Provider: Treating Provider/Extender: Reinaldo Raddle in Treatment: 7 Subjective Chief Complaint Information obtained from Patient Stage 3 left heel ulcer History of Present Illness (HPI) 64403 Upon evaluation today. Patient presents for initial inspection here in our clinic concerning [name] wound that began around August 1. This appears to be [name] pressure injury and the patient does have [name] history of stroke with  left sided weakness. This wound is on the left side heel posteriorly. With that being said, she tell me that she's been trying to keep pressure off of the back of the heel at this point. with that being said there is somewhat painful and is not draining [name] whole lot but does have [name] lot of callous buildup and eschar. Patient has [name] history of diabetes, type 2, hypertension, having had [name] stroke with left sided residual weakness, and coronary artery disease. 9/11; patient presents for follow-up. She was admitted at last clinic visit and was started on Xeroform to the left heel wound. She tries to offload the heel by floating this with [name] pillow when sitting and in the bed.  Marcea [name]. 09/23/2023 Medical Record Number: [id number] Patient Account Number: [id number] Date of Birth/Sex: Treating RN: 1956/11/08 (67 y.o. [name] Primary Care Provider: Mila [name] Other Clinician: Referring Provider: Treating Provider/Extender: Reinaldo Raddle in Treatment: 7 Diagnosis Coding ICD-10 Codes Code Description [phone] Pressure ulcer of left heel, stage 3 E11.621 Type 2 diabetes mellitus with foot ulcer I10 Essential (primary) hypertension I25.10 Atherosclerotic heart disease of native coronary artery without angina pectoris I69.854 Hemiplegia and hemiparesis following other cerebrovascular disease affecting left non-dominant side Facility Procedures Physician Procedures : CPT4 Code Description Modifier [id number] - WC PHYS LEVEL 3 - EST PT ICD-10 Diagnosis Description L89.623 Pressure ulcer of left heel, stage 3 E11.621 Type 2 diabetes mellitus with foot ulcer I10 Essential (primary) hypertension I25.10  Atherosclerotic heart disease of native coronary artery without angina pectoris Quantity: 1 Electronic Signature(s) Signed: 09/23/2023 12:47:43 PM By: Angelina Pih Signed: 09/23/2023 4:17:03 PM By: Allen Derry PA-C Previous Signature: 09/23/2023 12:38:22 PM Version By: Allen Derry PA-C Entered By: Angelina Pih  on 09/23/2023 12:47:43

## 2023-09-23 NOTE — Progress Notes (Signed)
0 Wound Tracing (instead of photographs) X- 1 5 Simple Wound Measurement - one wound []  - 0 Complex Wound Measurement - multiple wounds INTERVENTIONS - Wound Dressings []  - 0 Small Wound Dressing one or multiple wounds []  - 0 Medium Wound Dressing one or multiple wounds []  - 0 Large Wound Dressing one or multiple wounds X- 1 5 Application of Medications - topical []  - 0 Application of Medications - injection INTERVENTIONS - Miscellaneous []  - 0 External ear exam []  - 0 Specimen Collection (cultures, biopsies, blood, body fluids, etc.) []  - 0 Specimen(s) / Culture(s) sent or taken to Lab for analysis [name], [name] ([id number]) [phone].pdf Page 3 of 8 []  - 0 Patient Transfer (multiple staff / Nurse, adult / Similar devices) []  - 0 Simple Staple / Suture removal (25 or less) []  - 0 Complex Staple / Suture removal (26 or more) []  - 0 Hypo / Hyperglycemic Management (close monitor of Blood Glucose) []  - 0 Ankle / Brachial Index (ABI) - do not check if billed separately X- 1 5 Vital Signs Has the patient been seen at the hospital within the last three years: Yes Total Score: 80 Level Of Care: New/Established - Level 3 Electronic Signature(s) Signed: 09/23/2023 4:28:09 PM By: [name] Entered By: [name] on 09/23/2023 12:47:32 -------------------------------------------------------------------------------- Encounter Discharge Information Details Patient Name: Date of Service: [name]. 09/23/2023 12:00 PM Medical Record Number: [id number] Patient Account Number: [id number] Date of Birth/Sex: Treating RN: 01/17/1956 (67 y.o. [name] Primary Care [name]: [name] Other Clinician: Referring  [name]: Treating [name]/Extender: [name] in Treatment: 7 Encounter Discharge Information Items Discharge Condition: Stable Ambulatory Status: Wheelchair Discharge Destination: Home Transportation: Private Auto Accompanied By: daughter Schedule Follow-up Appointment: No Clinical Summary of Care: Electronic Signature(s) Signed: 09/23/2023 12:49:01 PM By: [name] Entered By: [name] on 09/23/2023 12:49:01 -------------------------------------------------------------------------------- Lower Extremity Assessment Details Patient Name: Date of Service: [name], [name] 09/23/2023 12:00 PM Medical Record Number: [id number] Patient Account Number: [id number] Date of Birth/Sex: Treating RN: 05-Apr-1956 (67 y.o. [name] Primary Care [name]: [name] Other Clinician: TAEGAN, [name] ([id number]) [id number] Page 4 of 8 Referring [name]: Treating [name]/Extender: [name] in Treatment: 7 Edema Assessment Assessed: [Left: No] [Right: No] Edema: [Left: N] [Right: o] Vascular Assessment Pulses: Dorsalis Pedis Palpable: [Left:Yes] Extremity colors, hair growth, and conditions: Extremity Color: [Left:Normal] Hair Growth on Extremity: [Left:Yes] Temperature of Extremity: [Left:Cool < 3 seconds] Toe Nail Assessment Left: Right: Thick: No Discolored: No Deformed: No Improper Length and Hygiene: No Electronic Signature(s) Signed: 09/23/2023 12:19:02 PM By: [name] Entered By: [name] on 09/23/2023 12:19:02 -------------------------------------------------------------------------------- Multi Wound Chart Details Patient Name: Date of Service: [name]. 09/23/2023 12:00 PM Medical Record Number: [id number] Patient Account Number: [id number] Date of Birth/Sex: Treating RN: 1956/05/19 (67 y.o. [name] Primary Care [name]: [name] Other Clinician: Referring [name]: Treating [name]/Extender: [name] in Treatment: 7 Vital Signs Height(in): 67 Pulse(bpm): 98 Weight(lbs): 162 Blood Pressure(mmHg): 157/93 Body Mass Index(BMI): 25.4 Temperature(F): 97.9 Respiratory Rate(breaths/min): 18 [1:Photos:] [N/[name]:N/[name]] Left Calcaneus N/[name] N/[name] Wound Location: Gradually Appeared N/[name] N/[name] Wounding Event: [name], [name] ([id number]) [id number] Page 5 of 8 Pressure Ulcer N/[name] N/[name] Primary Etiology: Coronary Artery Disease, N/[name] N/[name] Comorbid History: Hypertension, Type II Diabetes, Rheumatoid Arthritis 07/15/2023 N/[name] N/[name] Date Acquired: 7 N/[name] N/[name] Weeks of Treatment: Open N/[name] N/[name] Wound Status: No N/[name] N/[name] Wound Recurrence: 0.2x0.2x0.1 N/[name] N/[name]  Exposed: No Treatment Notes Wound #1 (Calcaneus) Wound Laterality: Left Cleanser Peri-Wound Care Topical Primary Dressing Secondary Dressing Secured With Compression Wrap Compression Stockings Add-Ons Electronic Signature(s) Signed: 09/23/2023 4:28:09 PM By: [name] Entered By: [name] on 09/23/2023 12:34:09 -------------------------------------------------------------------------------- Vitals Details Patient Name: Date of Service: [name]. 09/23/2023 12:00 PM Medical Record Number: [id number] Patient Account Number: [id number] Date of Birth/Sex: Treating RN: Dec 28, 1955 (67 y.o. [name] Primary Care [name]: [name] Other Clinician: Referring [name]: Treating [name]/Extender: [name] in Treatment: 7 Vital Signs Time Taken: 12:15 Temperature (F): 97.9 Height (in): 67 Pulse (bpm): 98 Weight (lbs): 162 Respiratory Rate (breaths/min): 18 Body Mass Index (BMI): 25.4 Blood Pressure (mmHg): 157/93 Reference Range: 80 - 120 mg / dl Electronic Signature(s) Signed: 09/23/2023 12:18:27 PM By: [name] Entered By: [name] on 09/23/2023 12:18:27  0 Wound Tracing (instead of photographs) X- 1 5 Simple Wound Measurement - one wound []  - 0 Complex Wound Measurement - multiple wounds INTERVENTIONS - Wound Dressings []  - 0 Small Wound Dressing one or multiple wounds []  - 0 Medium Wound Dressing one or multiple wounds []  - 0 Large Wound Dressing one or multiple wounds X- 1 5 Application of Medications - topical []  - 0 Application of Medications - injection INTERVENTIONS - Miscellaneous []  - 0 External ear exam []  - 0 Specimen Collection (cultures, biopsies, blood, body fluids, etc.) []  - 0 Specimen(s) / Culture(s) sent or taken to Lab for analysis [name], [name] ([id number]) [phone].pdf Page 3 of 8 []  - 0 Patient Transfer (multiple staff / Nurse, adult / Similar devices) []  - 0 Simple Staple / Suture removal (25 or less) []  - 0 Complex Staple / Suture removal (26 or more) []  - 0 Hypo / Hyperglycemic Management (close monitor of Blood Glucose) []  - 0 Ankle / Brachial Index (ABI) - do not check if billed separately X- 1 5 Vital Signs Has the patient been seen at the hospital within the last three years: Yes Total Score: 80 Level Of Care: New/Established - Level 3 Electronic Signature(s) Signed: 09/23/2023 4:28:09 PM By: [name] Entered By: [name] on 09/23/2023 12:47:32 -------------------------------------------------------------------------------- Encounter Discharge Information Details Patient Name: Date of Service: [name]. 09/23/2023 12:00 PM Medical Record Number: [id number] Patient Account Number: [id number] Date of Birth/Sex: Treating RN: 01/17/1956 (67 y.o. [name] Primary Care [name]: [name] Other Clinician: Referring  [name]: Treating [name]/Extender: [name] in Treatment: 7 Encounter Discharge Information Items Discharge Condition: Stable Ambulatory Status: Wheelchair Discharge Destination: Home Transportation: Private Auto Accompanied By: daughter Schedule Follow-up Appointment: No Clinical Summary of Care: Electronic Signature(s) Signed: 09/23/2023 12:49:01 PM By: [name] Entered By: [name] on 09/23/2023 12:49:01 -------------------------------------------------------------------------------- Lower Extremity Assessment Details Patient Name: Date of Service: [name], [name] 09/23/2023 12:00 PM Medical Record Number: [id number] Patient Account Number: [id number] Date of Birth/Sex: Treating RN: 05-Apr-1956 (67 y.o. [name] Primary Care [name]: [name] Other Clinician: TAEGAN, [name] ([id number]) [id number] Page 4 of 8 Referring [name]: Treating [name]/Extender: [name] in Treatment: 7 Edema Assessment Assessed: [Left: No] [Right: No] Edema: [Left: N] [Right: o] Vascular Assessment Pulses: Dorsalis Pedis Palpable: [Left:Yes] Extremity colors, hair growth, and conditions: Extremity Color: [Left:Normal] Hair Growth on Extremity: [Left:Yes] Temperature of Extremity: [Left:Cool < 3 seconds] Toe Nail Assessment Left: Right: Thick: No Discolored: No Deformed: No Improper Length and Hygiene: No Electronic Signature(s) Signed: 09/23/2023 12:19:02 PM By: [name] Entered By: [name] on 09/23/2023 12:19:02 -------------------------------------------------------------------------------- Multi Wound Chart Details Patient Name: Date of Service: [name]. 09/23/2023 12:00 PM Medical Record Number: [id number] Patient Account Number: [id number] Date of Birth/Sex: Treating RN: 1956/05/19 (67 y.o. [name] Primary Care [name]: [name] Other Clinician: Referring [name]: Treating [name]/Extender: [name] in Treatment: 7 Vital Signs Height(in): 67 Pulse(bpm): 98 Weight(lbs): 162 Blood Pressure(mmHg): 157/93 Body Mass Index(BMI): 25.4 Temperature(F): 97.9 Respiratory Rate(breaths/min): 18 [1:Photos:] [N/[name]:N/[name]] Left Calcaneus N/[name] N/[name] Wound Location: Gradually Appeared N/[name] N/[name] Wounding Event: [name], [name] ([id number]) [id number] Page 5 of 8 Pressure Ulcer N/[name] N/[name] Primary Etiology: Coronary Artery Disease, N/[name] N/[name] Comorbid History: Hypertension, Type II Diabetes, Rheumatoid Arthritis 07/15/2023 N/[name] N/[name] Date Acquired: 7 N/[name] N/[name] Weeks of Treatment: Open N/[name] N/[name] Wound Status: No N/[name] N/[name] Wound Recurrence: 0.2x0.2x0.1 N/[name] N/[name]  Exposed: No Treatment Notes Wound #1 (Calcaneus) Wound Laterality: Left Cleanser Peri-Wound Care Topical Primary Dressing Secondary Dressing Secured With Compression Wrap Compression Stockings Add-Ons Electronic Signature(s) Signed: 09/23/2023 4:28:09 PM By: [name] Entered By: [name] on 09/23/2023 12:34:09 -------------------------------------------------------------------------------- Vitals Details Patient Name: Date of Service: [name]. 09/23/2023 12:00 PM Medical Record Number: [id number] Patient Account Number: [id number] Date of Birth/Sex: Treating RN: Dec 28, 1955 (67 y.o. [name] Primary Care [name]: [name] Other Clinician: Referring [name]: Treating [name]/Extender: [name] in Treatment: 7 Vital Signs Time Taken: 12:15 Temperature (F): 97.9 Height (in): 67 Pulse (bpm): 98 Weight (lbs): 162 Respiratory Rate (breaths/min): 18 Body Mass Index (BMI): 25.4 Blood Pressure (mmHg): 157/93 Reference Range: 80 - 120 mg / dl Electronic Signature(s) Signed: 09/23/2023 12:18:27 PM By: [name] Entered By: [name] on 09/23/2023 12:18:27

## 2023-10-01 DIAGNOSIS — M5416 Radiculopathy, lumbar region: Secondary | ICD-10-CM | POA: Diagnosis not present

## 2023-10-07 ENCOUNTER — Other Ambulatory Visit (INDEPENDENT_AMBULATORY_CARE_PROVIDER_SITE_OTHER): Payer: Self-pay | Admitting: Nurse Practitioner

## 2023-10-07 ENCOUNTER — Other Ambulatory Visit: Payer: Self-pay

## 2023-10-07 DIAGNOSIS — L89623 Pressure ulcer of left heel, stage 3: Secondary | ICD-10-CM

## 2023-10-08 ENCOUNTER — Encounter (INDEPENDENT_AMBULATORY_CARE_PROVIDER_SITE_OTHER): Payer: Self-pay | Admitting: Vascular Surgery

## 2023-10-08 ENCOUNTER — Ambulatory Visit (INDEPENDENT_AMBULATORY_CARE_PROVIDER_SITE_OTHER): Payer: PPO

## 2023-10-08 ENCOUNTER — Ambulatory Visit (INDEPENDENT_AMBULATORY_CARE_PROVIDER_SITE_OTHER): Payer: PPO | Admitting: Vascular Surgery

## 2023-10-08 VITALS — BP 178/92 | HR 75 | Resp 16

## 2023-10-08 DIAGNOSIS — I6930 Unspecified sequelae of cerebral infarction: Secondary | ICD-10-CM

## 2023-10-08 DIAGNOSIS — L97421 Non-pressure chronic ulcer of left heel and midfoot limited to breakdown of skin: Secondary | ICD-10-CM | POA: Diagnosis not present

## 2023-10-08 DIAGNOSIS — E1165 Type 2 diabetes mellitus with hyperglycemia: Secondary | ICD-10-CM | POA: Diagnosis not present

## 2023-10-08 DIAGNOSIS — L89623 Pressure ulcer of left heel, stage 3: Secondary | ICD-10-CM | POA: Diagnosis not present

## 2023-10-08 DIAGNOSIS — I152 Hypertension secondary to endocrine disorders: Secondary | ICD-10-CM | POA: Diagnosis not present

## 2023-10-08 DIAGNOSIS — E1159 Type 2 diabetes mellitus with other circulatory complications: Secondary | ICD-10-CM

## 2023-10-08 NOTE — Assessment & Plan Note (Signed)
Noninvasive studies were performed today to evaluate her arterial perfusion for wound healing.  Her right ABI is 1.23 with triphasic waveforms and her left ABI is 1.07 with biphasic waveforms.  Digital pressures are greater than 100 bilaterally.  Given these findings, she should have adequate flow for wound healing.  No further vascular workup or intervention is planned at this time.  Follow-up with [place] as needed.

## 2023-10-08 NOTE — Assessment & Plan Note (Signed)
blood pressure control important in reducing the progression of atherosclerotic disease. On appropriate oral medications.  

## 2023-10-08 NOTE — Assessment & Plan Note (Signed)
Left sided weakness.

## 2023-10-08 NOTE — Assessment & Plan Note (Signed)
blood glucose control important in reducing the progression of atherosclerotic disease. Also, involved in wound healing. On appropriate medications.  

## 2023-10-08 NOTE — Progress Notes (Signed)
Patient ID: [name], female   DOB: January 15, 1956, 67 y.o.   MRN: [id number]  Chief Complaint  Patient presents with   New Patient (Initial Visit)    Ref Mikey Bussing consult pressure ulcer of left heel stage 3    HPI [name] is a 67 y.o. female.  I am asked to see the patient by Dr. Mikey Bussing for evaluation of poorly healing ulceration of the left heel.  The [facility] after a hip injury and developed a left heel ulceration.  This was slow to heal.  The wound center did an excellent job of taking care of this and it has now scabbed over.  It is not particularly painful to her.  She has previously had a stroke and her mobility is poor.  Noninvasive studies were performed today to evaluate her arterial perfusion for wound healing.  Her right ABI is 1.23 with triphasic waveforms and her left ABI is 1.07 with biphasic waveforms.  Digital pressures are greater than 100 bilaterally.     Past Medical History:  Diagnosis Date   Anxiety    Arthritis    CAD (coronary artery disease)    a. 3 vessel CAD noted on coronary CTA 04/2021, s/p CABG x4   Cataract    left   Depression    Diabetes mellitus without complication (HCC)    type 2   Environmental and seasonal allergies    GERD (gastroesophageal reflux disease)    Hyperlipidemia    Hypertension    Hypertensive urgency 04/18/2021   Hypoglycemia 06/09/2023   Joint pain    as reported by patient   Papillary fibroelastoma of heart    of the aortic valve,  s/p AVR and tumor resection on 08/04/2021   Right thalamic infarction (HCC) 04/29/2021   S/P AVR (aortic valve replacement)    s/p AVR with resection of AV fibroelastoma on 08/04/2021   S/P CABG x 4    a. s/p CABG x4 LIMA to LAD, SVG to PDA, and Sequential SVG to OM and Diagonal on 8/   Stroke (HCC) 04/2021   admitted at [facility] - left sided weakness   Urinary incontinence    as stated by patient    Past Surgical History:  Procedure  Laterality Date   AORTIC VALVE REPLACEMENT N/A 08/04/2021   Procedure: RESECTION AORTIC VALVE TUMOR;  Surgeon: Loreli Slot, MD;  Location: [facility] OR;  Service: Open Heart Surgery;  Laterality: N/A;   APPLICATION OF WOUND VAC N/A 08/24/2021   Procedure: APPLICATION OF WOUND VAC;  Surgeon: Alleen Borne, MD;  [facility] OR;  Service: Thoracic;  Laterality: N/A;   APPLICATION OF WOUND VAC N/A 08/28/2021   Procedure: WOUND VAC CHANGE;  Surgeon: Loreli Slot, MD;  Location: [facility] OR;  Service: Thoracic;  Laterality: N/A;   BREAST EXCISIONAL BIOPSY Right 1999   neg   BUBBLE STUDY  04/24/2021   Procedure: BUBBLE STUDY;  Surgeon: Quintella Reichert, MD;  Location: [facility] ENDOSCOPY;  Service: Cardiovascular;;   CATARACT EXTRACTION  12/2011   CATARACT EXTRACTION W/PHACO Left 12/02/2016   Procedure: CATARACT EXTRACTION PHACO AND INTRAOCULAR LENS PLACEMENT (IOC);  Surgeon: Sallee Lange, MD;  Location: [facility] ORS;  Service: Ophthalmology;  Laterality: Left;  [place] 2:14 AP% 28.4 CDE 59.73 Fluid pack lot # [id number]   COLONOSCOPY WITH PROPOFOL N/A 04/13/2022   Procedure: COLONOSCOPY WITH PROPOFOL;  Surgeon: Toney Reil, MD;  Location: [facility] ENDOSCOPY;  Service:  Gastroenterology;  Laterality: N/A;   CORONARY ARTERY BYPASS GRAFT N/A 08/04/2021   Procedure: CORONARY ARTERY BYPASS GRAFTING (CABG) X  4  USING LEFT INTERNAL MAMMARY ARTERY AND RIGHT GREATER SAPHENOUS VEIN ENDOSCOPIC CONDUITS;  Surgeon: Loreli Slot, MD;  Location[facility] OR;  Service: Open Heart Surgery;  Laterality: N/A;   DIAGNOSTIC LAPAROSCOPY     EYE SURGERY     HIP ARTHROPLASTY Left 06/02/2023   Procedure: ARTHROPLASTY BIPOLAR HIP (HEMIARTHROPLASTY);  Surgeon: Juanell Fairly, MD;  Location: [facility] ORS;  Service: Orthopedics;  Laterality: Left;   IR CT HEAD LTD  04/19/2021   IR PERCUTANEOUS ART THROMBECTOMY/INFUSION INTRACRANIAL INC DIAG ANGIO  04/19/2021       IR PERCUTANEOUS ART THROMBECTOMY/INFUSION INTRACRANIAL INC  DIAG ANGIO  04/19/2021   IR US GUIDE VASC ACCESS LEFT  04/19/2021   RADIOLOGY WITH ANESTHESIA N/A 04/19/2021   Procedure: IR WITH ANESTHESIA;  Surgeon: Julieanne Cotton, MD;  Location: [facility] OR;  Service: Radiology;  Laterality: N/A;   STERNAL WOUND DEBRIDEMENT N/A 08/24/2021   Procedure: STERNAL WOUND DRAINAGE AND DEBRIDEMENT;  Surgeon: Alleen Borne, MD;  Location[facility] OR;  Service: Thoracic;  Laterality: N/A;   STERNAL WOUND DEBRIDEMENT N/A 08/28/2021   Procedure: STERNAL WOUND DEBRIDEMENT;  Surgeon: Loreli Slot, MD;  Location: [facility] OR;  Service: Thoracic;  Laterality: N/A;   TEE WITHOUT CARDIOVERSION N/A 04/24/2021   Procedure: TRANSESOPHAGEAL ECHOCARDIOGRAM (TEE);  Surgeon: Quintella Reichert, MD;  Location: [facility] ENDOSCOPY;  Service: Cardiovascular;  Laterality: N/A;   TEE WITHOUT CARDIOVERSION N/A 08/04/2021   Procedure: TRANSESOPHAGEAL ECHOCARDIOGRAM (TEE);  Surgeon: Loreli Slot, MD;  Location: [facility] OR;  Service: Open Heart Surgery;  Laterality: N/A;   TOTAL HIP ARTHROPLASTY Right 04/04/2018   Procedure: TOTAL HIP ARTHROPLASTY;  Surgeon: Donato Heinz, MD;  Location: [facility] ORS;  Service: Orthopedics;  Laterality: Right;     Family History  Problem Relation Age of Onset   Bone cancer Brother    Aneurysm Mother        brain   Diabetes Father    CAD Father    Heart failure Father    Colon cancer Father    Cancer Father    Alzheimer's disease Paternal Grandmother    Dementia Paternal Grandmother    Mental illness Paternal Grandfather    Healthy Daughter    Healthy Son    Breast cancer Neg Hx       Social History   Tobacco Use   Smoking status: Former    Current packs/day: 0.00    Average packs/day: 1.5 packs/day for 25.0 years (37.5 ttl pk-yrs)    Types: Cigarettes    Start date: 03/23/1982    Quit date: 03/24/2007    Years since quitting: 16.5   Smokeless tobacco: Never  Vaping Use   Vaping status: Never Used  Substance Use Topics   Alcohol use: Not  Currently   Drug use: No     Allergies  Allergen Reactions   Jardiance [Empagliflozin] Itching    Recurrent mycotic infections   Penicillins Rash and Other (See Comments)    Has patient had a PCN reaction causing immediate rash, facial/tongue/throat swelling, SOB or lightheadedness with hypotension: No Has patient had a PCN reaction causing severe rash involving mucus membranes or skin necrosis: No Has patient had a PCN reaction that required hospitalization No Has patient had a PCN reaction occurring within the last 10 years: No If all of the above answers are "NO", then may proceed with Cephalosporin  use.     Current Outpatient Medications  Medication Sig Dispense Refill   acetaminophen (TYLENOL) 500 MG tablet Take 2 tablets (1,000 mg total) by mouth every 8 (eight) hours.     ALPRAZolam (XANAX) 0.5 MG tablet Take 1 tablet (0.5 mg total) by mouth 2 (two) times daily as needed for anxiety 180 tablet 3   ALPRAZolam (XANAX) 0.5 MG tablet Take 1 tablet (0.5 mg total) by mouth 2 (two) times daily as needed for anxiety 180 tablet 3   aspirin EC 81 MG tablet Take 1 tablet (81 mg total) by mouth daily. Swallow whole. 90 tablet 3   Blood Glucose Monitoring Suppl (BLOOD GLUCOSE MONITOR SYSTEM) w/Device KIT 1 each by Does not apply route in the morning, at noon, and at bedtime. 1 kit 0   Blood Glucose Monitoring Suppl (ONETOUCH VERIO REFLECT) w/Device KIT Use as directed. 1 kit 0   glucose blood (FREESTYLE LITE) test strip Use to check blood sugar up to 4 (four) times daily as needed for diabetes. 100 each 12   glyBURIDE (DIABETA) 5 MG tablet Take 2 tablets (10 mg total) by mouth 2 (two) times daily with a meal. 360 tablet 1   hydrALAZINE (APRESOLINE) 50 MG tablet Take 1 tablet (50 mg total) by mouth 3 (three) times daily as needed (SBP >150). 90 tablet 2   latanoprost (XALATAN) 0.005 % ophthalmic solution Place 1 drop into both eyes Nightly. 2.5 mL 6   metFORMIN (GLUCOPHAGE-XR) 500 MG 24 hr  tablet Take 2 tablets (1,000 mg total) by mouth 2 (two) times daily with a meal. 360 tablet 1   metoprolol tartrate (LOPRESSOR) 50 MG tablet Take 1 tablet (50 mg total) by mouth 2 (two) times daily. 90 tablet 3   Multiple Vitamin (MULTIVITAMIN) tablet Take 1 tablet by mouth daily.     oxybutynin (DITROPAN-XL) 10 MG 24 hr tablet Take 1 tablet (10 mg total) by mouth daily. 90 tablet 2   rosuvastatin (CRESTOR) 40 MG tablet Take 1 tablet (40 mg total) by mouth daily. 90 tablet 3   sertraline (ZOLOFT) 25 MG tablet Take 1 tablet by mouth at bedtime. 90 tablet 3   empagliflozin (JARDIANCE) 10 MG TABS tablet Take 1 tablet (10 mg total) by mouth daily before breakfast. (Patient not taking: Reported on 10/08/2023) 90 tablet 1   methylPREDNISolone (MEDROL) 4 MG TBPK tablet Take 6 tablets by mouth once daily on Day 1. Then decrease by 1 tablet daily until gone. (Patient not taking: Reported on 10/08/2023) 21 tablet 0   No current facility-administered medications for this visit.      REVIEW OF SYSTEMS (Negative unless checked)  Constitutional: [] Weight loss  [] Fever  [] Chills Cardiac: [] Chest pain   [] Chest pressure   [] Palpitations   [] Shortness of breath when laying flat   [] Shortness of breath at rest   [] Shortness of breath with exertion. Vascular:  [] Pain in legs with walking   [] Pain in legs at rest   [] Pain in legs when laying flat   [] Claudication   [] Pain in feet when walking  [] Pain in feet at rest  [] Pain in feet when laying flat   [] History of DVT   [] Phlebitis   [] Swelling in legs   [] Varicose veins   [x] Non-healing ulcers Pulmonary:   [] Uses home oxygen   [] Productive cough   [] Hemoptysis   [] Wheeze  [] COPD   [] Asthma Neurologic:  [] Dizziness  [] Blackouts   [] Seizures   [x] History of stroke   [] History of TIA  []   Aphasia   [] Temporary blindness   [] Dysphagia   [x] Weakness or numbness in arms   [x] Weakness or numbness in legs Musculoskeletal:  [] Arthritis   [] Joint swelling   [] Joint pain    [] Low back pain Hematologic:  [] Easy bruising  [] Easy bleeding   [] Hypercoagulable state   [] Anemic  [] Hepatitis Gastrointestinal:  [] Blood in stool   [] Vomiting blood  [x] Gastroesophageal reflux/heartburn   [] Abdominal pain Genitourinary:  [] Chronic kidney disease   [] Difficult urination  [] Frequent urination  [] Burning with urination   [] Hematuria Skin:  [] Rashes   [x] Ulcers   [x] Wounds Psychological:  [x] History of anxiety   [x]  History of major depression.    Physical Exam BP (!) 178/92 (BP Location: Right Arm)   Pulse 75   Resp 16  Gen:  WD/WN, NAD Head: [place]/AT, No temporalis wasting.  Ear/Nose/Throat: Hearing grossly intact, nares w/o erythema or drainage, oropharynx w/o Erythema/Exudate Eyes: Conjunctiva clear, sclera non-icteric  Neck: trachea midline.  No JVD.  Pulmonary:  Good air movement, respirations not labored, no use of accessory muscles  Cardiac: RRR, no JVD Vascular:  Vessel Right Left  Radial Palpable Palpable                          DP 2+ palpable 1+ palpable  PT 2+ palpable 2+ palpable   Gastrointestinal:. No masses, surgical incisions, or scars. Musculoskeletal: Left-sided weakness is present.  Extremities without ischemic changes.  No deformity or atrophy.  Left heel ulceration now essentially healed with a dry dressing in place.  No significant lower extremity edema. Neurologic: Sensation grossly intact in extremities.  Left-sided weakness is present.  Speech is fluent.  Psychiatric: Judgment intact, Mood & affect appropriate for pt's clinical situation. Dermatologic: No rashes or ulcers noted.  No cellulitis or open wounds.    Radiology No results found.  Labs Recent Results (from the past 2160 hour(s))  CBC with Differential     Status: Abnormal   Collection Time: 08/05/23  2:01 PM  Result Value Ref Range   WBC 7.8 4.0 - 10.5 K/uL   RBC 5.52 (H) 3.87 - 5.11 MIL/uL   Hemoglobin 15.2 (H) 12.0 - 15.0 g/dL   HCT 47.8 29.5 - 62.1 %   MCV 83.0  80.0 - 100.0 fL   MCH 27.5 26.0 - 34.0 pg   MCHC 33.2 30.0 - 36.0 g/dL   RDW 30.8 65.7 - 84.6 %   Platelets 274 150 - 400 K/uL   nRBC 0.0 0.0 - 0.2 %   Neutrophils Relative % 75 %   Neutro Abs 5.9 1.7 - 7.7 K/uL   Lymphocytes Relative 18 %   Lymphs Abs 1.4 0.7 - 4.0 K/uL   Monocytes Relative 5 %   Monocytes Absolute 0.4 0.1 - 1.0 K/uL   Eosinophils Relative 1 %   Eosinophils Absolute 0.1 0.0 - 0.5 K/uL   Basophils Relative 1 %   Basophils Absolute 0.1 0.0 - 0.1 K/uL   Immature Granulocytes 0 %   Abs Immature Granulocytes 0.03 0.00 - 0.07 K/uL    Comment: Performed at [facility], [address] [place], [place] [address]  Comprehensive metabolic panel     Status: Abnormal   Collection Time: 08/05/23  2:01 PM  Result Value Ref Range   Sodium 133 (L) 135 - 145 mmol/L   Potassium 3.6 3.5 - 5.1 mmol/L   Chloride 99 98 - 111 mmol/L   CO2 26 22 - 32 mmol/L  Glucose, Bld 274 (H) 70 - 99 mg/dL    Comment: Glucose reference range applies only to samples taken after fasting for at least 8 hours.   BUN 14 8 - 23 mg/dL   Creatinine, Ser 7.82 0.44 - 1.00 mg/dL   Calcium 9.3 8.9 - 95.6 mg/dL   Total Protein 8.4 (H) 6.5 - 8.1 g/dL   Albumin 3.9 3.5 - 5.0 g/dL   AST 12 (L) 15 - 41 U/L   ALT 6 0 - 44 U/L   Alkaline Phosphatase 50 38 - 126 U/L   Total Bilirubin 0.6 0.3 - 1.2 mg/dL   GFR, Estimated >21 >30 mL/min    Comment: (NOTE) Calculated using the CKD-EPI Creatinine Equation (2021)    Anion gap 8 5 - 15    Comment: Performed at [facility], [address] [place], [place] [address]  CBG monitoring, ED     Status: Abnormal   Collection Time: 08/05/23  2:05 PM  Result Value Ref Range   Glucose-Capillary 252 (H) 70 - 99 mg/dL    Comment: Glucose reference range applies only to samples taken after fasting for at least 8 hours.  Ethanol     Status: None   Collection Time: 08/05/23  2:31 PM  Result Value Ref Range   Alcohol, Ethyl (B) <10 <10 mg/dL    Comment:  (NOTE) Lowest detectable limit for serum alcohol is 10 mg/dL.  For medical purposes only. Performed at [facility], [address] [place], [place] [address]   Protime-INR     Status: None   Collection Time: 08/05/23  2:31 PM  Result Value Ref Range   Prothrombin Time 13.5 11.4 - 15.2 seconds   INR 1.0 0.8 - 1.2    Comment: (NOTE) INR goal varies based on device and disease states. Performed at [facility], [address] [place], [place] [address]   APTT     Status: None   Collection Time: 08/05/23  2:31 PM  Result Value Ref Range   aPTT 29 24 - 36 seconds    Comment: Performed at [facility], [address] [place], [place] [address]  Urinalysis, Complete w Microscopic -Urine, Clean Catch     Status: Abnormal   Collection Time: 08/05/23  5:40 PM  Result Value Ref Range   Color, Urine STRAW (A) YELLOW   APPearance CLEAR (A) CLEAR   Specific Gravity, Urine 1.004 (L) 1.005 - 1.030   pH 6.0 5.0 - 8.0   Glucose, UA NEGATIVE NEGATIVE mg/dL   Hgb urine dipstick SMALL (A) NEGATIVE   Bilirubin Urine NEGATIVE NEGATIVE   Ketones, ur NEGATIVE NEGATIVE mg/dL   Protein, ur NEGATIVE NEGATIVE mg/dL   Nitrite NEGATIVE NEGATIVE   Leukocytes,Ua SMALL (A) NEGATIVE   RBC / HPF 0-5 0 - 5 RBC/hpf   WBC, UA 11-20 0 - 5 WBC/hpf   Bacteria, UA MANY (A) NONE SEEN   Squamous Epithelial / HPF 0-5 0 - 5 /HPF   Mucus PRESENT     Comment: Performed at [facility], [address], [place], [place] [address]  Urine Drug Screen, Qualitative     Status: Abnormal   Collection Time: 08/05/23  5:40 PM  Result Value Ref Range   Tricyclic, Ur Screen NONE DETECTED NONE DETECTED   Amphetamines, Ur Screen NONE DETECTED NONE DETECTED   MDMA (Ecstasy)Ur Screen NONE DETECTED NONE DETECTED   Cocaine Metabolite,Ur [place] NONE DETECTED NONE DETECTED   Opiate, Ur Screen NONE DETECTED  NONE DETECTED   Phencyclidine (PCP) Ur S NONE DETECTED NONE DETECTED   Cannabinoid 50 Ng,  Ur [place] NONE DETECTED NONE DETECTED   Barbiturates, Ur Screen NONE DETECTED NONE DETECTED   Benzodiazepine, Ur Scrn POSITIVE (A) NONE DETECTED   Methadone Scn, Ur NONE DETECTED NONE DETECTED    Comment: (NOTE) Tricyclics + metabolites, urine    Cutoff 1000 ng/mL Amphetamines + metabolites, urine  Cutoff 1000 ng/mL MDMA (Ecstasy), urine              Cutoff 500 ng/mL Cocaine Metabolite, urine          Cutoff 300 ng/mL Opiate + metabolites, urine        Cutoff 300 ng/mL Phencyclidine (PCP), urine         Cutoff 25 ng/mL Cannabinoid, urine                 Cutoff 50 ng/mL Barbiturates + metabolites, urine  Cutoff 200 ng/mL Benzodiazepine, urine              Cutoff 200 ng/mL Methadone, urine                   Cutoff 300 ng/mL  The urine drug screen provides only a preliminary, unconfirmed analytical test result and should not be used for non-medical purposes. Clinical consideration and professional judgment should be applied to any positive drug screen result due to possible interfering substances. A more specific alternate chemical method must be used in order to obtain a confirmed analytical result. Gas chromatography / mass spectrometry (GC/MS) is the preferred confirm atory method. Performed at [facility], [address] [place], [place] [address]   CBG monitoring, ED     Status: Abnormal   Collection Time: 08/05/23 10:33 PM  Result Value Ref Range   Glucose-Capillary 182 (H) 70 - 99 mg/dL    Comment: Glucose reference range applies only to samples taken after fasting for at least 8 hours.  CBG monitoring, ED     Status: Abnormal   Collection Time: 08/06/23  7:30 AM  Result Value Ref Range   Glucose-Capillary 147 (H) 70 - 99 mg/dL    Comment: Glucose reference range applies only to samples taken after fasting for at least 8 hours.  ECHOCARDIOGRAM COMPLETE     Status: None   Collection Time: 08/06/23  8:51 AM  Result Value Ref Range   Height 67 in   BP 147/81 mmHg    Ao pk vel 1.19 m/s   AV Area VTI 2.12 cm2   AR max vel 1.83 cm2   AV Mean grad 3.3 mmHg   AV Peak grad 5.7 mmHg   S' Lateral 2.80 cm   AV Area mean vel 1.73 cm2   Area-P 1/2 4.96 cm2   MV VTI 1.74 cm2   Est EF 55 - 60%   CBG monitoring, ED     Status: Abnormal   Collection Time: 08/06/23 11:21 AM  Result Value Ref Range   Glucose-Capillary 166 (H) 70 - 99 mg/dL    Comment: Glucose reference range applies only to samples taken after fasting for at least 8 hours.  Lipid panel     Status: Abnormal   Collection Time: 08/06/23 11:24 AM  Result Value Ref Range   Cholesterol 248 (H) 0 - 200 mg/dL   Triglycerides 604 <540 mg/dL   HDL 40 (L) >98 mg/dL   Total CHOL/HDL Ratio 6.2 RATIO   VLDL 24 0 - 40 mg/dL  LDL Cholesterol 184 (H) 0 - 99 mg/dL    Comment:        Total Cholesterol/HDL:CHD Risk Coronary Heart Disease Risk Table                     Men   Women  1/2 Average Risk   3.4   3.3  Average Risk       5.0   4.4  2 X Average Risk   9.6   7.1  3 X Average Risk  23.4   11.0        Use the calculated Patient Ratio above and the CHD Risk Table to determine the patient's CHD Risk.        ATP III CLASSIFICATION (LDL):  <100     mg/dL   Optimal  161-096  mg/dL   Near or Above                    Optimal  130-159  mg/dL   Borderline  045-409  mg/dL   High  >811     mg/dL   Very High Performed at [facility], [address] [place], [place] [address]   Basic metabolic panel     Status: Abnormal   Collection Time: 08/06/23 11:24 AM  Result Value Ref Range   Sodium 133 (L) 135 - 145 mmol/L   Potassium 4.0 3.5 - 5.1 mmol/L   Chloride 98 98 - 111 mmol/L   CO2 25 22 - 32 mmol/L   Glucose, Bld 179 (H) 70 - 99 mg/dL    Comment: Glucose reference range applies only to samples taken after fasting for at least 8 hours.   BUN 17 8 - 23 mg/dL   Creatinine, Ser 2.95 0.44 - 1.00 mg/dL   Calcium 9.5 8.9 - 62.1 mg/dL   GFR, Estimated >30 >86 mL/min    Comment:  (NOTE) Calculated using the CKD-EPI Creatinine Equation (2021)    Anion gap 10 5 - 15    Comment: Performed at [facility], [address] [place], [place] [address]  CBC     Status: None   Collection Time: 08/06/23 11:24 AM  Result Value Ref Range   WBC 8.5 4.0 - 10.5 K/uL   RBC 5.08 3.87 - 5.11 MIL/uL   Hemoglobin 14.1 12.0 - 15.0 g/dL   HCT 96.2 95.2 - 84.1 %   MCV 82.5 80.0 - 100.0 fL   MCH 27.8 26.0 - 34.0 pg   MCHC 33.7 30.0 - 36.0 g/dL   RDW 32.4 40.1 - 02.7 %   Platelets 280 150 - 400 K/uL   nRBC 0.0 0.0 - 0.2 %    Comment: Performed at [facility], [address] [place], [place] [address]  Magnesium     Status: None   Collection Time: 08/06/23 11:24 AM  Result Value Ref Range   Magnesium 1.8 1.7 - 2.4 mg/dL    Comment: Performed at [facility], [address] [place], [place] [address]  Phosphorus     Status: None   Collection Time: 08/06/23 11:24 AM  Result Value Ref Range   Phosphorus 4.1 2.5 - 4.6 mg/dL    Comment: Performed at [facility], [address] [place], [place] [address]  CBG monitoring, ED     Status: Abnormal   Collection Time: 08/06/23  5:29 PM  Result Value Ref Range   Glucose-Capillary 174 (H) 70 - 99 mg/dL  Comment: Glucose reference range applies only to samples taken after fasting for at least 8 hours.    Assessment/Plan:  Heel ulcer, left, limited to breakdown of skin (HCC) Noninvasive studies were performed today to evaluate her arterial perfusion for wound healing.  Her right ABI is 1.23 with triphasic waveforms and her left ABI is 1.07 with biphasic waveforms.  Digital pressures are greater than 100 bilaterally.  Given these findings, she should have adequate flow for wound healing.  No further vascular workup or intervention is planned at this time.  Follow-up with [place] as needed.  Uncontrolled type 2 diabetes mellitus with hyperglycemia, without long-term current use of insulin (HCC) blood  glucose control important in reducing the progression of atherosclerotic disease. Also, involved in wound healing. On appropriate medications.   Hypertension associated with diabetes (HCC) blood pressure control important in reducing the progression of atherosclerotic disease. On appropriate oral medications.   History of CVA with residual deficit Left-sided weakness.      Festus Barren 10/08/2023, 12:43 PM   This note was created with Dragon medical transcription system.  Any errors from dictation are unintentional.

## 2023-10-12 LAB — VAS US ABI WITH/WO TBI
Left ABI: 1.07
Right ABI: 1.23

## 2023-10-13 ENCOUNTER — Other Ambulatory Visit: Payer: Self-pay

## 2023-10-13 ENCOUNTER — Other Ambulatory Visit (HOSPITAL_BASED_OUTPATIENT_CLINIC_OR_DEPARTMENT_OTHER): Payer: Self-pay

## 2023-10-13 ENCOUNTER — Other Ambulatory Visit: Payer: Self-pay | Admitting: Urology

## 2023-10-13 ENCOUNTER — Encounter: Payer: Self-pay | Admitting: Family Medicine

## 2023-10-13 ENCOUNTER — Ambulatory Visit: Payer: PPO | Admitting: Family Medicine

## 2023-10-13 ENCOUNTER — Telehealth: Payer: Self-pay | Admitting: Family Medicine

## 2023-10-13 VITALS — BP 171/102 | HR 80 | Ht 67.0 in | Wt 161.0 lb

## 2023-10-13 DIAGNOSIS — N3941 Urge incontinence: Secondary | ICD-10-CM

## 2023-10-13 DIAGNOSIS — E1122 Type 2 diabetes mellitus with diabetic chronic kidney disease: Secondary | ICD-10-CM

## 2023-10-13 DIAGNOSIS — Z79899 Other long term (current) drug therapy: Secondary | ICD-10-CM

## 2023-10-13 DIAGNOSIS — B351 Tinea unguium: Secondary | ICD-10-CM | POA: Diagnosis not present

## 2023-10-13 DIAGNOSIS — N39498 Other specified urinary incontinence: Secondary | ICD-10-CM

## 2023-10-13 DIAGNOSIS — I10 Essential (primary) hypertension: Secondary | ICD-10-CM

## 2023-10-13 DIAGNOSIS — I6930 Unspecified sequelae of cerebral infarction: Secondary | ICD-10-CM | POA: Diagnosis not present

## 2023-10-13 DIAGNOSIS — S72002A Fracture of unspecified part of neck of left femur, initial encounter for closed fracture: Secondary | ICD-10-CM

## 2023-10-13 DIAGNOSIS — E785 Hyperlipidemia, unspecified: Secondary | ICD-10-CM

## 2023-10-13 DIAGNOSIS — N182 Chronic kidney disease, stage 2 (mild): Secondary | ICD-10-CM | POA: Diagnosis not present

## 2023-10-13 DIAGNOSIS — S72002D Fracture of unspecified part of neck of left femur, subsequent encounter for closed fracture with routine healing: Secondary | ICD-10-CM | POA: Diagnosis not present

## 2023-10-13 DIAGNOSIS — E1165 Type 2 diabetes mellitus with hyperglycemia: Secondary | ICD-10-CM

## 2023-10-13 DIAGNOSIS — N3281 Overactive bladder: Secondary | ICD-10-CM

## 2023-10-13 DIAGNOSIS — I69354 Hemiplegia and hemiparesis following cerebral infarction affecting left non-dominant side: Secondary | ICD-10-CM | POA: Diagnosis not present

## 2023-10-13 DIAGNOSIS — E1169 Type 2 diabetes mellitus with other specified complication: Secondary | ICD-10-CM

## 2023-10-13 DIAGNOSIS — F419 Anxiety disorder, unspecified: Secondary | ICD-10-CM | POA: Diagnosis not present

## 2023-10-13 DIAGNOSIS — N3000 Acute cystitis without hematuria: Secondary | ICD-10-CM

## 2023-10-13 MED ORDER — METOPROLOL TARTRATE 50 MG PO TABS
50.0000 mg | ORAL_TABLET | Freq: Two times a day (BID) | ORAL | 0 refills | Status: AC
  Filled 2023-10-13: qty 180, 90d supply, fill #0

## 2023-10-13 MED ORDER — ROSUVASTATIN CALCIUM 40 MG PO TABS
40.0000 mg | ORAL_TABLET | Freq: Every day | ORAL | 3 refills | Status: DC
  Filled 2023-10-13 – 2024-03-06 (×3): qty 90, 90d supply, fill #0

## 2023-10-13 MED ORDER — LOSARTAN POTASSIUM 100 MG PO TABS
100.0000 mg | ORAL_TABLET | Freq: Every day | ORAL | 1 refills | Status: DC
Start: 2023-10-13 — End: 2024-10-03
  Filled 2023-10-13: qty 90, 90d supply, fill #0
  Filled 2024-03-06: qty 90, 90d supply, fill #1

## 2023-10-13 MED ORDER — OXYBUTYNIN CHLORIDE ER 10 MG PO TB24
10.0000 mg | ORAL_TABLET | Freq: Every day | ORAL | 2 refills | Status: AC
Start: 2023-10-13 — End: ?
  Filled 2023-10-13: qty 90, 90d supply, fill #0
  Filled 2024-03-06: qty 90, 90d supply, fill #1

## 2023-10-13 MED ORDER — EMPAGLIFLOZIN 10 MG PO TABS
10.0000 mg | ORAL_TABLET | Freq: Every day | ORAL | 1 refills | Status: DC
Start: 2023-10-13 — End: 2023-10-13
  Filled 2023-10-13 (×2): qty 30, 30d supply, fill #0

## 2023-10-13 MED ORDER — OXYBUTYNIN CHLORIDE ER 10 MG PO TB24
10.0000 mg | ORAL_TABLET | Freq: Every day | ORAL | Status: DC
Start: 2023-10-13 — End: 2023-10-13

## 2023-10-13 MED ORDER — METFORMIN HCL ER 500 MG PO TB24
1000.0000 mg | ORAL_TABLET | Freq: Two times a day (BID) | ORAL | Status: DC
Start: 2023-10-13 — End: 2024-10-03

## 2023-10-13 MED ORDER — TERBINAFINE HCL 250 MG PO TABS
250.0000 mg | ORAL_TABLET | Freq: Every day | ORAL | 0 refills | Status: AC
Start: 2023-10-13 — End: ?
  Filled 2023-10-13: qty 30, 30d supply, fill #0

## 2023-10-13 MED ORDER — METFORMIN HCL ER 500 MG PO TB24
1000.0000 mg | ORAL_TABLET | Freq: Every day | ORAL | 1 refills | Status: DC
  Filled 2023-10-13: qty 180, 90d supply, fill #0

## 2023-10-13 NOTE — Telephone Encounter (Signed)
Please advise patient that she is also supposed to be starting back on losartan 100mg  once a day. It's not on the list I gave her at her appointment because I hadn't sent the prescription to the pharmacy yet.

## 2023-10-13 NOTE — Telephone Encounter (Signed)
Patient reports that she is not going to take Jardiance because is $40 out of pocket and wants something cheaper. Reports that you cancelled the Oxybutynin she is asking why?

## 2023-10-13 NOTE — Telephone Encounter (Signed)
I though she said she already had some oxybutynin. Have sent prescription to her pharmacy.   If she can't take Jardiance then she needs to take 2 metformin twice a day instead of just once a day. She should have enough on her prescription to take two twice a day for a few months.

## 2023-10-13 NOTE — Telephone Encounter (Signed)
Patient advised.

## 2023-10-13 NOTE — Patient Instructions (Signed)
.   Please review the attached list of medications and notify my office if there are any errors.   . Please bring all of your medications to every appointment so we can make sure that our medication list is the same as yours.   

## 2023-10-14 ENCOUNTER — Other Ambulatory Visit (HOSPITAL_COMMUNITY): Payer: Self-pay

## 2023-10-14 ENCOUNTER — Other Ambulatory Visit: Payer: Self-pay

## 2023-10-14 ENCOUNTER — Other Ambulatory Visit: Payer: Self-pay | Admitting: Family Medicine

## 2023-10-14 DIAGNOSIS — E1122 Type 2 diabetes mellitus with diabetic chronic kidney disease: Secondary | ICD-10-CM

## 2023-10-14 DIAGNOSIS — N3941 Urge incontinence: Secondary | ICD-10-CM

## 2023-10-14 DIAGNOSIS — M5416 Radiculopathy, lumbar region: Secondary | ICD-10-CM | POA: Diagnosis not present

## 2023-10-14 DIAGNOSIS — E1169 Type 2 diabetes mellitus with other specified complication: Secondary | ICD-10-CM

## 2023-10-14 NOTE — Telephone Encounter (Signed)
Medication Refill - Medication:  rosuvastatin (CRESTOR) 40 MG tablet  metFORMIN (GLUCOPHAGE-XR) 500 MG 24 hr tablet oxybutynin (DITROPAN-XL) 10 MG 24 hr tablet  Has the patient contacted their pharmacy? Yes Never got the script  Preferred Pharmacy (with phone number or street name): [facility]  Phone: [phone] Fax: [phone]  Has the patient been seen for an appointment in the last year OR does the patient have an upcoming appointment? Yes.    Agent: Please be advised that RX refills may take up to 3 business days. We ask that you follow-up with your pharmacy.

## 2023-10-14 NOTE — Telephone Encounter (Signed)
Duplicate request, medications refilled 10/13/23.  Requested Prescriptions  Pending Prescriptions Disp Refills   rosuvastatin (CRESTOR) 40 MG tablet 90 tablet 3    Sig: Take 1 tablet (40 mg total) by mouth daily.     Cardiovascular:  Antilipid - Statins 2 Failed - 10/14/2023  3:40 PM      Failed - Lipid Panel in normal range within the last 12 months    Cholesterol, Total  Date Value Ref Range Status  10/16/2022 202 (H) 100 - 199 mg/dL Final   Cholesterol  Date Value Ref Range Status  08/06/2023 248 (H) 0 - 200 mg/dL Final  16/09/9603 540 (H) 0 - 200 mg/dL Final   Ldl Cholesterol, Calc  Date Value Ref Range Status  09/06/2013 117 (H) 0 - 100 mg/dL Final   LDL Chol Calc (NIH)  Date Value Ref Range Status  10/16/2022 124 (H) 0 - 99 mg/dL Final   LDL Cholesterol  Date Value Ref Range Status  08/06/2023 184 (H) 0 - 99 mg/dL Final    Comment:           Total Cholesterol/HDL:CHD Risk Coronary Heart Disease Risk Table                     Men   Women  1/2 Average Risk   3.4   3.3  Average Risk       5.0   4.4  2 X Average Risk   9.6   7.1  3 X Average Risk  23.4   11.0        Use the calculated Patient Ratio above and the CHD Risk Table to determine the patient's CHD Risk.        ATP III CLASSIFICATION (LDL):  <100     mg/dL   Optimal  981-191  mg/dL   Near or Above                    Optimal  130-159  mg/dL   Borderline  478-295  mg/dL   High  >621     mg/dL   Very High Performed at [facility], [address] [place], [place] [address]    HDL Cholesterol  Date Value Ref Range Status  09/06/2013 53 40 - 60 mg/dL Final   HDL  Date Value Ref Range Status  08/06/2023 40 (L) >40 mg/dL Final  78/46/9629 46 >52 mg/dL Final   Triglycerides  Date Value Ref Range Status  08/06/2023 121 <150 mg/dL Final  84/13/2440 102 0 - 200 mg/dL Final         Passed - Cr in normal range and within 360 days    Creatinine  Date Value Ref Range Status  09/06/2013  0.80 0.60 - 1.30 mg/dL Final   Creatinine, Ser  Date Value Ref Range Status  08/06/2023 0.81 0.44 - 1.00 mg/dL Final         Passed - Patient is not pregnant      Passed - Valid encounter within last 12 months    Recent Outpatient Visits           Yesterday Polypharmacy   [facility] Malva Limes, MD   2 months ago Acute pain of left knee   [facility] Erasmo Downer, MD   8 months ago Acute conjunctivitis of left eye, unspecified acute conjunctivitis type   [facility] Malva Limes, MD  10 months ago Congestion of nasal sinus   [facility] Merita Norton T, FNP   12 months ago Seasonal allergic rhinitis, unspecified trigger   [place] [facility] Merita Norton T, FNP       Future Appointments             In 3 weeks Sherrie Mustache, Demetrios Isaacs, MD [facility]             metFORMIN (GLUCOPHAGE-XR) 500 MG 24 hr tablet      Sig: Take 2 tablets (1,000 mg total) by mouth 2 (two) times daily with a meal.     Endocrinology:  Diabetes - Biguanides Failed - 10/14/2023  3:40 PM      Failed - HBA1C is between 0 and 7.9 and within 180 days    Hemoglobin A1C  Date Value Ref Range Status  09/06/2013 6.7 (H) 4.2 - 6.3 % Final    Comment:    The American Diabetes Association recommends that a primary goal of therapy should be <7% and that physicians should reevaluate the treatment regimen in patients with HbA1c values consistently >8%.    Hgb A1c MFr Bld  Date Value Ref Range Status  05/31/2023 11.0 (H) 4.8 - 5.6 % Final    Comment:    (NOTE) Pre diabetes:          5.7%-6.4%  Diabetes:              >6.4%  Glycemic control for   <7.0% adults with diabetes          Failed - B12 Level in normal range and within 720 days    No results found for: "VITAMINB12"       Passed - Cr in normal range and within 360 days     Creatinine  Date Value Ref Range Status  09/06/2013 0.80 0.60 - 1.30 mg/dL Final   Creatinine, Ser  Date Value Ref Range Status  08/06/2023 0.81 0.44 - 1.00 mg/dL Final         Passed - eGFR in normal range and within 360 days    EGFR (African American)  Date Value Ref Range Status  09/06/2013 >60  Final   GFR calc Af Amer  Date Value Ref Range Status  09/20/2020 81 >59 mL/min/1.73 Final    Comment:    **[facility] currently reports eGFR in compliance with the current**   recommendations of the [employer]. [facility] will   update reporting as new guidelines are published from the NKF-ASN   Task force.    EGFR (Non-African Amer.)  Date Value Ref Range Status  09/06/2013 >60  Final    Comment:    eGFR values <66mL/min/1.73 m2 may be an indication of chronic kidney disease (CKD). Calculated eGFR is useful in patients with stable renal function. The eGFR calculation will not be reliable in acutely ill patients when serum creatinine is changing rapidly. It is not useful in  patients on dialysis. The eGFR calculation may not be applicable to patients at the low and high extremes of body sizes, pregnant women, and vegetarians.    GFR, Estimated  Date Value Ref Range Status  08/06/2023 >60 >60 mL/min Final    Comment:    (NOTE) Calculated using the CKD-EPI Creatinine Equation (2021)    eGFR  Date Value Ref Range Status  10/16/2022 58 (L) >59 mL/min/1.73 Final         Passed - Valid encounter  within last 6 months    Recent Outpatient Visits           Yesterday Polypharmacy   [facility] Malva Limes, MD   2 months ago Acute pain of left knee   [facility] [place], Marzella Schlein, MD   8 months ago Acute conjunctivitis of left eye, unspecified acute conjunctivitis type   [facility] Malva Limes, MD   10 months ago Congestion of nasal sinus   [facility] Merita Norton T, FNP   12 months ago Seasonal allergic rhinitis, unspecified trigger   [facility] Jacky Kindle, FNP       Future Appointments             In 3 weeks Sherrie Mustache, Demetrios Isaacs, MD [facility]            Passed - CBC within normal limits and completed in the last 12 months    WBC  Date Value Ref Range Status  08/06/2023 8.5 4.0 - 10.5 K/uL Final   RBC  Date Value Ref Range Status  08/06/2023 5.08 3.87 - 5.11 MIL/uL Final   Hemoglobin  Date Value Ref Range Status  08/06/2023 14.1 12.0 - 15.0 g/dL Final  78/29/5621 30.8 11.1 - 15.9 g/dL Final   HCT  Date Value Ref Range Status  08/06/2023 41.9 36.0 - 46.0 % Final   Hematocrit  Date Value Ref Range Status  10/16/2022 44.3 34.0 - 46.6 % Final   MCHC  Date Value Ref Range Status  08/06/2023 33.7 30.0 - 36.0 g/dL Final   [facility]  Date Value Ref Range Status  08/06/2023 27.8 26.0 - 34.0 pg Final   MCV  Date Value Ref Range Status  08/06/2023 82.5 80.0 - 100.0 fL Final  10/16/2022 87 79 - 97 fL Final   No results found for: "PLTCOUNTKUC", "LABPLAT", "POCPLA" RDW  Date Value Ref Range Status  08/06/2023 13.3 11.5 - 15.5 % Final  10/16/2022 12.1 11.7 - 15.4 % Final          oxybutynin (DITROPAN-XL) 10 MG 24 hr tablet 90 tablet 2    Sig: Take 1 tablet (10 mg total) by mouth daily.     Urology:  Bladder Agents Passed - 10/14/2023  3:40 PM      Passed - Valid encounter within last 12 months    Recent Outpatient Visits           Yesterday Polypharmacy   [facility] Malva Limes, MD   2 months ago Acute pain of left knee   [facility] Erasmo Downer, MD   8 months ago Acute conjunctivitis of left eye, unspecified acute conjunctivitis type   [facility] Malva Limes, MD   10 months ago Congestion of nasal sinus   [facility] Merita Norton T, FNP   12 months ago Seasonal allergic rhinitis, unspecified trigger   [place] [facility] Jacky Kindle, FNP       Future Appointments             In 3 weeks Fisher, Demetrios Isaacs, MD [facility], [facility]

## 2023-10-15 ENCOUNTER — Other Ambulatory Visit: Payer: Self-pay

## 2023-10-18 ENCOUNTER — Other Ambulatory Visit: Payer: Self-pay

## 2023-10-18 ENCOUNTER — Encounter: Payer: PPO | Admitting: Physical Medicine and Rehabilitation

## 2023-10-18 MED ORDER — METHYLPREDNISOLONE 4 MG PO TBPK
ORAL_TABLET | ORAL | 0 refills | Status: DC
  Filled 2023-10-18 – 2023-11-04 (×2): qty 21, 6d supply, fill #0

## 2023-10-28 ENCOUNTER — Other Ambulatory Visit: Payer: Self-pay

## 2023-10-28 NOTE — Progress Notes (Signed)
[name] ([id number]) [id number] Page 1 of 9 Visit Report for 08/05/2023 Chief Complaint Document Details Patient Name: Date of Service: [name] 08/05/2023 9:30 [name] Medical Record Number: [id number] Patient Account Number: [id number] Date of Birth/Sex: Treating RN: 06-Sep-1956 (67 y.o. Freddy Finner Primary Care Provider: Mila Merry Other Clinician: Referring Provider: Treating Provider/Extender: Reinaldo Raddle in Treatment: 0 Information Obtained from: Patient Chief Complaint Stage 3 left heel ulcer Electronic Signature(s) Signed: 08/05/2023 10:30:43 AM By: Allen Derry PA-C Entered By: Allen Derry on 08/05/2023 10:30:43 -------------------------------------------------------------------------------- Debridement Details Patient Name: Date of Service: [name]. 08/05/2023 9:30 [name] Medical Record Number: [id number] Patient Account Number: [id number] Date of Birth/Sex: Treating RN: 30-Jan-1956 (67 y.o. Freddy Finner Primary Care Provider: Mila Merry Other Clinician: Referring Provider: Treating Provider/Extender: Reinaldo Raddle in Treatment: 0 Debridement Performed for Assessment: Wound #1 Left Calcaneus Performed By: Physician Allen Derry, PA-C Debridement Type: Debridement Level of Consciousness (Pre-procedure): Awake and Alert Pre-procedure Verification/Time Out Yes - 10:35 Taken: Start Time: 10:35 Percent of Wound Bed Debrided: 100% T Area Debrided (cm): otal 0.27 Tissue and other material debrided: Viable, Non-Viable, Callus, Slough, Subcutaneous, Slough Level: Skin/Subcutaneous Tissue Debridement Description: Excisional Instrument: Curette Bleeding: Minimum Hemostasis Achieved: Pressure End Time: 10:39 Procedural Pain: 0 Post Procedural Pain: 0 Response to Treatment: Procedure was tolerated well [name], [name] ([id number]) [id number] Page  2 of 9 Level of Consciousness (Post- Awake and Alert procedure): Post Debridement Measurements of Total Wound Length: (cm) 0.7 Stage: Category/Stage III Width: (cm) 0.5 Depth: (cm) 0.3 Volume: (cm) 0.082 Character of Wound/Ulcer Post Debridement: Improved Post Procedure Diagnosis Same as Pre-procedure Electronic Signature(s) Signed: 08/06/2023 1:57:59 PM By: Allen Derry PA-C Signed: 08/13/2023 11:59:03 AM By: Yevonne Pax RN Entered By: Yevonne Pax on 08/05/2023 10:40:35 -------------------------------------------------------------------------------- HPI Details Patient Name: Date of Service: [name]. 08/05/2023 9:30 [name] Medical Record Number: [id number] Patient Account Number: [id number] Date of Birth/Sex: Treating RN: [birth year] (67 y.o. Freddy Finner Primary Care Provider: Mila Merry Other Clinician: Referring Provider: Treating Provider/Extender: Reinaldo Raddle in Treatment: 0 History of Present Illness HPI Description: [phone] Upon evaluation today. Patient presents for initial inspection here in our clinic concerning [name] wound that began around August 1. This appears to be [name] pressure injury and the patient does have [name] history of stroke with left sided weakness. This wound is on the left side heel posteriorly. With that being said, she tell me that she's been trying to keep pressure off of the back of the heel at this point. with that being said there is somewhat painful and is not draining [name] whole lot but does have [name] lot of callous buildup and eschar. Patient has [name] history of diabetes, type 2, hypertension, having had [name] stroke with left sided residual weakness, and coronary artery disease. Electronic Signature(s) Signed: 10/28/2023 7:59:03 AM By: Allen Derry PA-C Entered By: Allen Derry on 08/12/2023 10:34:49 -------------------------------------------------------------------------------- Physical Exam Details Patient Name: Date of  Service: [name], [name] 08/05/2023 9:30 [name] Medical Record Number: [id number] Patient Account Number: [id number] Date of Birth/Sex: Treating RN: Oct 18, 1956 (67 y.o. Freddy Finner Primary Care Provider: Mila Merry Other Clinician: [name], [name] ([id number]) [id number] Page 3 of 9 Referring Provider: Treating Provider/Extender: Reinaldo Raddle in Treatment: 0 Constitutional patient is hypertensive.. pulse regular and within target range for patient.Marland Kitchen respirations regular, non-labored and within target range for patient.Marland Kitchen temperature within target  range for patient.. Well-nourished and well-hydrated in no acute distress. Eyes conjunctiva clear no eyelid edema noted. pupils equal round and reactive to light and accommodation. Ears, Nose, Mouth, and Throat no gross abnormality of ear auricles or external auditory canals. normal hearing noted during conversation. mucus membranes moist. Respiratory normal breathing without difficulty. Cardiovascular 2+ dorsalis pedis/posterior tibialis pulses. no clubbing, cyanosis, significant edema, <3 sec cap refill. Musculoskeletal Patient unable to walk without assistance. Psychiatric this patient is able to make decisions and demonstrates good insight into disease process. Alert and Oriented x 3. pleasant and cooperative. Notes Upon evaluation patients, ABI was low today at 0.56. With that being said her blood pressure was elevated and I am going to have this rechecked next time she comes in. If this continues to be on the low side, we will send her for vascular evaluation formally. In the meantime, I did perform [name] sharp debridement, though I did this lightly to remove callus and necrotic debris as best I could. We will use [name] xeroform gauze dressing. Physically, the patient did not appear to have any signs of vascular compromise in her toes with good capillary refill and good pulses. Electronic  Signature(s) Signed: 10/28/2023 7:59:03 AM By: Allen Derry PA-C Entered By: Allen Derry on 08/12/2023 10:39:20 -------------------------------------------------------------------------------- Physician Orders Details Patient Name: Date of Service: [name]. 08/05/2023 9:30 [name] Medical Record Number: [id number] Patient Account Number: [id number] Date of Birth/Sex: Treating RN: 1956-08-10 (67 y.o. Freddy Finner Primary Care Provider: Mila Merry Other Clinician: Referring Provider: Treating Provider/Extender: Reinaldo Raddle in Treatment: 0 Verbal / Phone Orders: No Diagnosis Coding ICD-10 Coding Code Description [phone] Pressure ulcer of left heel, stage 3 E11.621 Type 2 diabetes mellitus with foot ulcer I10 Essential (primary) hypertension I25.10 Atherosclerotic heart disease of native coronary artery without angina pectoris I69.854 Hemiplegia and hemiparesis following other cerebrovascular disease affecting left non-dominant side Follow-up Appointments Return Appointment in 2 weeks. Bathing/ Shower/ Hygiene May shower; gently cleanse wound with antibacterial soap, rinse and pat dry prior to dressing wounds [name], [name] ([id number]) [id number] Page 4 of 9 Edema Control - Lymphedema / Segmental Compressive Device / Other Elevate, Exercise Daily and [name] void Standing for Long Periods of Time. Elevate legs to the level of the heart and pump ankles as often as possible Elevate leg(s) parallel to the floor when sitting. Wound Treatment Wound #1 - Calcaneus Wound Laterality: Left Cleanser: Soap and Water Every Other Day Discharge Instructions: Gently cleanse wound with antibacterial soap, rinse and pat dry prior to dressing wounds Prim Dressing: Xeroform-HBD 2x2 (in/in) Every Other Day ary Discharge Instructions: Apply Xeroform-HBD 2x2 (in/in) as directed Secondary Dressing: Coverlet Latex-Free Fabric Adhesive Dressings  Every Other Day Discharge Instructions: 1.5 x 2 Electronic Signature(s) Signed: 08/06/2023 1:57:59 PM By: Allen Derry PA-C Signed: 08/13/2023 11:59:03 AM By: Yevonne Pax RN Entered By: Yevonne Pax on 08/05/2023 10:45:20 -------------------------------------------------------------------------------- Problem List Details Patient Name: Date of Service: [name]. 08/05/2023 9:30 [name] Medical Record Number: [id number] Patient Account Number: [id number] Date of Birth/Sex: Treating RN: May 26, 1956 (67 y.o. Freddy Finner Primary Care Provider: Mila Merry Other Clinician: Referring Provider: Treating Provider/Extender: Reinaldo Raddle in Treatment: 0 Active Problems ICD-10 Encounter Code Description Active Date MDM Diagnosis [phone] Pressure ulcer of left heel, stage 3 08/05/2023 No Yes E11.621 Type 2 diabetes mellitus with foot ulcer 08/05/2023 No Yes I10 Essential (primary) hypertension 08/05/2023 No Yes I25.10 Atherosclerotic heart disease of native coronary  artery without angina pectoris 08/05/2023 No Yes I69.854 Hemiplegia and hemiparesis following other cerebrovascular disease affecting 08/05/2023 No Yes left non-dominant side Inactive Problems [name], [name] ([id number]) [id number] Page 5 of 9 Resolved Problems Electronic Signature(s) Signed: 08/05/2023 10:30:25 AM By: Allen Derry PA-C Entered By: Allen Derry on 08/05/2023 10:30:24 -------------------------------------------------------------------------------- Progress Note Details Patient Name: Date of Service: [name]. 08/05/2023 9:30 [name] Medical Record Number: [id number] Patient Account Number: [id number] Date of Birth/Sex: Treating RN: January 13, 1956 (67 y.o. Freddy Finner Primary Care Provider: Mila Merry Other Clinician: Referring Provider: Treating Provider/Extender: Reinaldo Raddle in Treatment: 0 Subjective Chief  Complaint Information obtained from Patient Stage 3 left heel ulcer History of Present Illness (HPI) 44010 Upon evaluation today. Patient presents for initial inspection here in our clinic concerning [name] wound that began around August 1. This appears to be [name] pressure injury and the patient does have [name] history of stroke with left sided weakness. This wound is on the left side heel posteriorly. With that being said, she tell me that she's been trying to keep pressure off of the back of the heel at this point. with that being said there is somewhat painful and is not draining [name] whole lot but does have [name] lot of callous buildup and eschar. Patient has [name] history of diabetes, type 2, hypertension, having had [name] stroke with left sided residual weakness, and coronary artery disease. Patient History Allergies penicillin Social History Former smoker, Marital Status - Widowed, Alcohol Use - Never, Drug Use - No History, Caffeine Use - Daily. Medical History Cardiovascular Patient has history of Coronary Artery Disease, Hypertension Endocrine Patient has history of Type II Diabetes Musculoskeletal Patient has history of Rheumatoid Arthritis Patient is treated with Oral Agents. Review of Systems (ROS) Eyes Complains or has symptoms of Glasses / Contacts. Integumentary (Skin) Complains or has symptoms of Wounds. Musculoskeletal Complains or has symptoms of Muscle Weakness - stroke. Objective Constitutional [name], [name] ([id number]) [id number] Page 6 of 9 patient is hypertensive.. pulse regular and within target range for patient.Marland Kitchen respirations regular, non-labored and within target range for patient.Marland Kitchen temperature within target range for patient.. Well-nourished and well-hydrated in no acute distress. Vitals Time Taken: 9:55 AM, Height: 67 in, Source: Stated, Weight: 162 lbs, Source: Stated, BMI: 25.4, Temperature: 97.8 F, Pulse: 81 bpm, Respiratory Rate: 18  breaths/min, Blood Pressure: 195/100 mmHg. Eyes conjunctiva clear no eyelid edema noted. pupils equal round and reactive to light and accommodation. Ears, Nose, Mouth, and Throat no gross abnormality of ear auricles or external auditory canals. normal hearing noted during conversation. mucus membranes moist. Respiratory normal breathing without difficulty. Cardiovascular 2+ dorsalis pedis/posterior tibialis pulses. no clubbing, cyanosis, significant edema, Musculoskeletal Patient unable to walk without assistance. Psychiatric this patient is able to make decisions and demonstrates good insight into disease process. Alert and Oriented x 3. pleasant and cooperative. General Notes: Upon evaluation patients, ABI was low today at 0.56. With that being said her blood pressure was elevated and I am going to have this rechecked next time she comes in. If this continues to be on the low side, we will send her for vascular evaluation formally. In the meantime, I did perform [name] sharp debridement, though I did this lightly to remove callus and necrotic debris as best I could. We will use [name] xeroform gauze dressing. Physically, the patient did not appear to have any signs of vascular compromise in her toes with good capillary refill and good pulses. Integumentary (  Hair, Skin) Wound #1 status is Open. Original cause of wound was Gradually Appeared. The date acquired was: 07/15/2023. The wound is located on the Left Calcaneus. The wound measures 0.7cm length x 0.5cm width x 0.3cm depth; 0.275cm^2 area and 0.082cm^3 volume. There is Fat Layer (Subcutaneous Tissue) exposed. There is no tunneling or undermining noted. There is [name] medium amount of serosanguineous drainage noted. There is large (67-100%) pink granulation within the wound bed. There is [name] small (1-33%) amount of necrotic tissue within the wound bed including Eschar and Adherent Slough. Assessment Active Problems ICD-10 Pressure ulcer of left heel,  stage 3 Type 2 diabetes mellitus with foot ulcer Essential (primary) hypertension Atherosclerotic heart disease of native coronary artery without angina pectoris Hemiplegia and hemiparesis following other cerebrovascular disease affecting left non-dominant side Procedures Wound #1 Pre-procedure diagnosis of Wound #1 is [name] Pressure Ulcer located on the Left Calcaneus . There was [name] Excisional Skin/Subcutaneous Tissue Debridement with [name] total area of 0.27 sq cm performed by Allen Derry, PA-C. With the following instrument(s): Curette to remove Viable and Non-Viable tissue/material. Material removed includes Callus, Subcutaneous Tissue, and Slough. No specimens were taken. [name] time out was conducted at 10:35, prior to the start of the procedure. [name] Minimum amount of bleeding was controlled with Pressure. The procedure was tolerated well with [name] pain level of 0 throughout and [name] pain level of 0 following the procedure. Post Debridement Measurements: 0.7cm length x 0.5cm width x 0.3cm depth; 0.082cm^3 volume. Post debridement Stage noted as Category/Stage III. Character of Wound/Ulcer Post Debridement is improved. Post procedure Diagnosis Wound #1: Same as Pre-Procedure Plan Follow-up Appointments: Return Appointment in 2 weeks. Bathing/ Shower/ Hygiene: May shower; gently cleanse wound with antibacterial soap, rinse and pat dry prior to dressing wounds Edema Control - Lymphedema / Segmental Compressive Device / Other: Elevate, Exercise Daily and Avoid Standing for Long Periods of Time. Elevate legs to the level of the heart and pump ankles as often as possible Elevate leg(s) parallel to the floor when sitting. WOUND #1: - Calcaneus Wound Laterality: Left Cleanser: Soap and Water Every Other Day/ Discharge Instructions: Gently cleanse wound with antibacterial soap, rinse and pat dry prior to dressing wounds Prim Dressing: Xeroform-HBD 2x2 (in/in) Every Other Day/ ary Discharge Instructions: Apply  Xeroform-HBD 2x2 (in/in) as directed Secondary Dressing: Coverlet Latex-Free Fabric Adhesive Dressings Every Other Day/ [name], [name] ([id number]) [id number] Page 7 of 9 Discharge Instructions: 1.5 x 2 1. With regard to the patient's low ABI today, we are going to recheck that at the next visit. If it indeed remains low, then we will send her for formal vascular evaluation. 2. I'm gonna recommend initiation of treatment with [name] xeroform gauze dressing. 3. I'm also gonna recommend the patient keep pressure off of the back of the heel. Specifically, she is going to need to put [name] pillow under her calf region to float the heel. We will see her back for reevaluation in one week. If anything worsens or changes in the meantime, she should contact the office and let us know or else go to the ER for further evaluation. Unfortunately, the patient was not able to come back in one weeks time therefore I will be seeing her in roughly 2 weeks. Electronic Signature(s) Signed: 10/28/2023 7:59:03 AM By: Allen Derry PA-C Entered By: Allen Derry on 08/12/2023 10:41:48 -------------------------------------------------------------------------------- ROS/PFSH Details Patient Name: Date of Service: [name]. 08/05/2023 9:30 [name] Medical Record Number: [id number] Patient Account Number: [id number] Date  of Birth/Sex: Treating RN: 09-19-1956 (67 y.o. Freddy Finner Primary Care Provider: Mila Merry Other Clinician: Referring Provider: Treating Provider/Extender: Reinaldo Raddle in Treatment: 0 Eyes Complaints and Symptoms: Positive for: Glasses / Contacts Integumentary (Skin) Complaints and Symptoms: Positive for: Wounds Musculoskeletal Complaints and Symptoms: Positive for: Muscle Weakness - stroke Medical History: Positive for: Rheumatoid Arthritis Cardiovascular Medical History: Positive for: Coronary Artery Disease;  Hypertension Endocrine Medical History: Positive for: Type II Diabetes Time with diabetes: 15 years Treated with: Oral agents Immunizations Pneumococcal Vaccine: Received Pneumococcal Vaccination: No [name], [name] ([id number]) [id number] Page 8 of 9 Implantable Devices None Family and Social History Former smoker; Marital Status - Widowed; Alcohol Use: Never; Drug Use: No History; Caffeine Use: Daily Electronic Signature(s) Signed: 08/06/2023 1:57:59 PM By: Allen Derry PA-C Signed: 08/13/2023 11:59:03 AM By: Yevonne Pax RN Entered By: Yevonne Pax on 08/05/2023 09:58:33 -------------------------------------------------------------------------------- SuperBill Details Patient Name: Date of Service: [name] 08/05/2023 Medical Record Number: [id number] Patient Account Number: [id number] Date of Birth/Sex: Treating RN: 1956-08-24 (67 y.o. Freddy Finner Primary Care Provider: Mila Merry Other Clinician: Referring Provider: Treating Provider/Extender: Reinaldo Raddle in Treatment: 0 Diagnosis Coding ICD-10 Codes Code Description [phone] Pressure ulcer of left heel, stage 3 E11.621 Type 2 diabetes mellitus with foot ulcer I10 Essential (primary) hypertension I25.10 Atherosclerotic heart disease of native coronary artery without angina pectoris I69.854 Hemiplegia and hemiparesis following other cerebrovascular disease affecting left non-dominant side Facility Procedures : CPT4 Code: [id number] Description: 99213 - WOUND CARE VISIT-LEV 3 EST PT Modifier: Quantity: 1 : CPT4 Code: [id number] Description: 11042 - DEB SUBQ TISSUE 20 SQ CM/< ICD-10 Diagnosis Description L89.623 Pressure ulcer of left heel, stage 3 Modifier: Quantity: 1 Physician Procedures : CPT4 Code Description Modifier [id number] WC PHYS LEVEL 3 NEW PT 25 ICD-10 Diagnosis Description L89.623 Pressure ulcer of left heel, stage 3 E11.621 Type 2 diabetes  mellitus with foot ulcer I10 Essential (primary) hypertension I25.10 Atherosclerotic  heart disease of native coronary artery without angina pectoris Quantity: 1 : [id number] 11042 - WC PHYS SUBQ TISS 20 SQ CM ICD-10 Diagnosis Description L89.623 Pressure ulcer of left heel, stage 3 Quantity: 1 Electronic Signature(s) [name], [name] ([id number]) [id number] Page 9 of 9 Signed: 08/05/2023 4:51:45 PM By: Allen Derry PA-C Entered By: Allen Derry on 08/05/2023 16:51:45

## 2023-11-04 ENCOUNTER — Other Ambulatory Visit: Payer: Self-pay

## 2023-11-10 ENCOUNTER — Ambulatory Visit: Payer: PPO | Admitting: Family Medicine

## 2023-12-28 DIAGNOSIS — M5442 Lumbago with sciatica, left side: Secondary | ICD-10-CM | POA: Diagnosis not present

## 2023-12-28 DIAGNOSIS — M4316 Spondylolisthesis, lumbar region: Secondary | ICD-10-CM | POA: Diagnosis not present

## 2023-12-28 DIAGNOSIS — G8929 Other chronic pain: Secondary | ICD-10-CM | POA: Diagnosis not present

## 2023-12-28 DIAGNOSIS — R29898 Other symptoms and signs involving the musculoskeletal system: Secondary | ICD-10-CM | POA: Diagnosis not present

## 2023-12-28 NOTE — Progress Notes (Signed)
 Chief Complaint: Chief Complaint  Patient presents with  . Lower Back - Pain      HPI: Patient is a pleasant 68 y.o. female seen as a self-referral for evaluation of back pain and left leg weakness.  She states that right now she is not having any pain.  Pain is intermittent, sharp.  She does note numbness and weakness in the left leg which she states was present after a stroke in 2022.  She states that this did worsen after her fall in June 2024 where she fractured her left hip.  She denies any tingling or loss of control of bowel or bladder.  She does feel like the pain is improving but she is concerned with the numbness and weakness in the left leg.  Her symptoms are worse with standing and walking and better with resting.  She states that after her hospital stay in June 2024 she did go to a skilled nursing facility where she had inpatient and outpatient physical therapy for 3 months.    Review of Systems: A 10 point review of systems is negative, except for the pertinent positives and negatives detailed in the HPI.  PMH: Past Medical History:  Diagnosis Date  . Anxiety disorder   . Arthritis   . Cataract    LEFT EYE  . Chicken pox   . Depression   . Diabetes mellitus type 2, uncomplicated (CMS/HHS-HCC)   . Diverticulosis of colon   . Hyperlipidemia   . Hypertension   . Urinary incontinence      PSH: Past Surgical History:  Procedure Laterality Date  . BREAST EXCISIONAL BIOPSY Right 1999  . BREAST LUMPECTOMY Right 2005  . CATARACT EXTRACTION Right 12/2011  . CATARACT EXTRACTION WITH PHACO Left 12/02/2016  . Right total hip arthroplasty  04/04/2018   Dr Mardee     Family History: Family History  Problem Relation Name Age of Onset  . Aneurysm Mother    . Heart disease Father    . Colon cancer Father    . Diabetes type II Father    . No Known Problems Brother    . No Known Problems Daughter    . No Known Problems Son    . Alzheimer's disease Paternal Grandmother    .  Dementia Paternal Grandmother    . Mental illness Paternal Grandfather       Social History: Social History   Socioeconomic History  . Marital status: Widowed  . Number of children: 2  . Years of education: 14  Occupational History  . Occupation: Full-time- Designer, Industrial/product  Tobacco Use  . Smoking status: Former    Current packs/day: 0.00    Average packs/day: 1 pack/day for 20.0 years (20.0 ttl pk-yrs)    Types: Cigarettes    Start date: 57    Quit date: 2009    Years since quitting: 16.0  . Smokeless tobacco: Never  Substance and Sexual Activity  . Alcohol use: Yes  . Drug use: Never  . Sexual activity: Defer    Partners: Male   Social Drivers of Health   Financial Resource Strain: Low Risk  (12/28/2023)   Overall Financial Resource Strain (CARDIA)   . Difficulty of Paying Living Expenses: Not hard at all  Food Insecurity: No Food Insecurity (12/28/2023)   Hunger Vital Sign   . Worried About Programme Researcher, Broadcasting/film/video in the Last Year: Never true   . Ran Out of Food in the Last Year: Never true  Transportation Needs:  No Transportation Needs (12/28/2023)   PRAPARE - Transportation   . Lack of Transportation (Medical): No   . Lack of Transportation (Non-Medical): No     Allergies: Allergies  Allergen Reactions  . Penicillin Shortness Of Breath, Swelling and Rash  . Jardiance  [Empagliflozin ] Itching and Rash  . Penicillins Other (See Comments) and Rash    NDC Rniz:[id number]  NDC Rniz:[id number]  NDC Rniz:[id number]  Has patient had a PCN reaction causing immediate rash, facial/tongue/throat swelling, SOB or lightheadedness with hypotension: No  Has patient had a PCN reaction causing severe rash involving mucus membranes or skin necrosis: No  Has patient had a PCN reaction that required hospitalization No  Has patient had a PCN reaction occurring within the last 10 years: No  If all of the above answers are NO, then may proceed with Cephalosporin use.      Medications:  Current Outpatient Medications:  .  ACCU-CHEK GUIDE test strip, 1 each 3 (three) times daily  , Disp: , Rfl: 12 .  acetaminophen  (TYLENOL ) 500 MG tablet, Take 1,000 mg by mouth every 8 (eight) hours, Disp: , Rfl:  .  ALPRAZolam  (XANAX ) 0.5 MG tablet, Take 0.5 mg by mouth 2 (two) times daily as needed, Disp: , Rfl:  .  aspirin  81 MG EC tablet, Take 81 mg by mouth once daily, Disp: , Rfl:  .  aspirin -calcium  carbonate 81 mg-300 mg calcium (777 mg) Tab, Take 1 tablet by mouth once daily  , Disp: , Rfl:  .  empagliflozin  (JARDIANCE ) 10 mg tablet, Take 10 mg by mouth every morning before breakfast, Disp: , Rfl:  .  lancets, 1 each 3 (three) times daily  , Disp: , Rfl: 12 .  losartan  (COZAAR ) 25 MG tablet, 25 mg 2 (two) times daily  , Disp: , Rfl: 1 .  meloxicam  (MOBIC ) 7.5 MG tablet, Take 7.5 mg by mouth 2 (two) times daily, Disp: , Rfl:  .  metFORMIN  (GLUCOPHAGE ) 1000 MG tablet, 1,000 mg 2 (two) times daily with meals  , Disp: , Rfl: 1 .  multivitamin (MULTIVITAMIN) tablet, Take 1 tablet by mouth once daily  , Disp: , Rfl:  .  naproxen sodium (ALEVE, ANAPROX) 220 MG tablet, Take 220 mg by mouth 2 (two) times daily with meals  , Disp: , Rfl:  .  oxyBUTYnin  (DITROPAN -XL) 10 MG XL tablet, Take 1 tablet by mouth once daily, Disp: , Rfl:  .  rosuvastatin  (CRESTOR ) 40 MG tablet, Take 40 mg by mouth once daily, Disp: , Rfl:  .  sertraline  (ZOLOFT ) 25 MG tablet, Take 1 tablet by mouth at bedtime, Disp: , Rfl:  .  sitaGLIPtin  (JANUVIA ) 100 MG tablet, Take 100 mg by mouth once daily  , Disp: , Rfl:   Vitals: Vitals:   12/28/23 1038  BP: (!) 199/99  Pulse: 88  Temp: 36.1 C (97 F)  TempSrc: Oral  Weight: 71.7 kg (158 lb)  Height: 167.6 cm (5' 6)  PainSc:   4  PainLoc: Back     Physical Exam: Constitutional: vital signs reviewed, healthy appearing  Psych: normal affect and mood, pain behaviors consistent throughout exam, appropriate judgement and insight  Skin: no rashes or  lesions, on palpation there are no palpable nodules or areas of induration  Resp: even and unlabored, no use of accessory muscles, no tactile fremitis  Cardiac: no clubbing, cyanosis, edema  GI:  nondistended, nontender  MSK: normal inspection lumbar spine, no TTP, did not range lumbar spine due to falls risk, seated  slump on the left causes left buttocks pain Neuro: sensation to soft touch decreased throughout left lower extremity compared to right, motor exam 5/5 both legs except 4/5 left hip flexion, reflexes 1+ BL patella   Imaging: MRI lumbar spine done at [facility] October 2024: L2-3 left paracentral disc protrusion with mild left NFS; L3-4 mild bilateral NFS, facet arthropathy; L4-5 anterior listhesis with facet arthropathy, severe left and moderate right NFS, moderate CCS; L5-S1 left paracentral disc protrusion with mild left NFS, facet arthropathy  GFR greater than 60 drawn on 08/06/2023 Platelet count 280 drawn on 08/06/2023  Assessment: Chronic low back pain with left lumbar radiculitis History of diabetes with hemoglobin A1c 11 drawn on 05/31/2023 History of hypertension Status post left hip fracture with surgical correction done by [facility] per patient.  I do not have records to review of this History of CABG History of CVA in 2022  Plan: I did review the family physician's note on 10/13/2023.  Also reviewed the MRI of the lumbar spine from [facility] in October 2024.  Please see above.  I also reviewed the CBC and CMP from 08/06/2023 as well as a hemoglobin A1c from 05/31/2023.  Please see pertinent values above.  I did discuss with the patient MRI findings.  I would like to get flexion-extension x-rays of the lumbar spine due to listhesis.  She is in agreement.  X-rays ordered.  She is complaining of worsening weakness in the left leg since the fall in June 2024.  She states that her left leg was weak from the CVA in 2022 but it worsened after the fall.  I did discuss that she  does have severe foraminal narrowing at L4-5 which may be contributing to some of the weakness.  I would like to get an EMG of the left lower extremity to further evaluate.  She is in agreement with this.  EMG ordered.  The patient did go to a skilled nursing facility after her left hip surgery in June 2024.  She is continue with a home exercise program.  I did offer her another course of PT but she declined.  She did ask me to fill out a handicap placard for her which I did do for 6 months.  I did tell her this is only temporary and if she needs a permanent 1 to go through her PCPs office.  She verbalized understanding.  Follow-up as needed.  We will call her with the results of the EMG.  Since patient is not having any pain I do not recommend an epidural injection.  Would consider surgical referral depending on EMG results.  Patient agrees with above plan.  Answered all questions.

## 2024-01-26 ENCOUNTER — Other Ambulatory Visit: Payer: Self-pay

## 2024-01-27 ENCOUNTER — Telehealth: Payer: Self-pay

## 2024-01-27 NOTE — Telephone Encounter (Signed)
Patient was identified as falling into the [place] Measure - Diabetes.   Patient was: Patient refuses intervention.   Patient's 70 year old grandson just passed away and patient said she is not up to coming in for visit.  Consoled patient over her loss and educated her on how this type of stress can effect her body. Advised to try and keep her glucose checked and to let us know as soon as she feels she can come in and be seen.

## 2024-02-10 ENCOUNTER — Other Ambulatory Visit (HOSPITAL_COMMUNITY): Payer: Self-pay

## 2024-02-10 ENCOUNTER — Other Ambulatory Visit: Payer: Self-pay

## 2024-02-10 ENCOUNTER — Other Ambulatory Visit: Payer: Self-pay | Admitting: Family Medicine

## 2024-02-10 ENCOUNTER — Other Ambulatory Visit: Payer: Self-pay | Admitting: Physical Medicine and Rehabilitation

## 2024-02-10 DIAGNOSIS — F419 Anxiety disorder, unspecified: Secondary | ICD-10-CM

## 2024-02-11 ENCOUNTER — Other Ambulatory Visit: Payer: Self-pay

## 2024-02-11 NOTE — Telephone Encounter (Signed)
 Requested medication (s) are due for refill today: na   Requested medication (s) are on the active medication list: yes  Last refill:  08/12/23 #180 3 refills  Future visit scheduled: no  Notes to clinic:  not delegated per protocol. Last ordered by Alease Frame, MD. Do you want to refill Rx?     Requested Prescriptions  Pending Prescriptions Disp Refills   ALPRAZolam (XANAX) 0.5 MG tablet 180 tablet 3    Sig: Take 1 tablet (0.5 mg total) by mouth 2 (two) times daily as needed for anxiety     Not Delegated - Psychiatry: Anxiolytics/Hypnotics 2 Failed - 02/11/2024  2:54 PM      Failed - This refill cannot be delegated      Passed - Urine Drug Screen completed in last 360 days      Passed - Patient is not pregnant      Passed - Valid encounter within last 6 months    Recent Outpatient Visits           4 months ago Polypharmacy   [facility] Malva Limes, MD   6 months ago Acute pain of left knee    [facility] [place], Marzella Schlein, MD   1 year ago Acute conjunctivitis of left eye, unspecified acute conjunctivitis type   [facility] Malva Limes, MD   1 year ago Congestion of nasal sinus   [facility] Merita Norton T, FNP   1 year ago Seasonal allergic rhinitis, unspecified trigger   [facility] Jacky Kindle, FNP

## 2024-02-12 MED FILL — Alprazolam Tab 0.5 MG: ORAL | 90 days supply | Qty: 180 | Fill #0 | Status: CN

## 2024-02-13 ENCOUNTER — Other Ambulatory Visit: Payer: Self-pay

## 2024-02-14 ENCOUNTER — Other Ambulatory Visit: Payer: Self-pay

## 2024-02-25 ENCOUNTER — Other Ambulatory Visit: Payer: Self-pay

## 2024-03-03 ENCOUNTER — Ambulatory Visit: Payer: PPO | Admitting: Family Medicine

## 2024-03-06 ENCOUNTER — Other Ambulatory Visit: Payer: Self-pay

## 2024-03-06 ENCOUNTER — Other Ambulatory Visit: Payer: Self-pay | Admitting: Family Medicine

## 2024-03-06 MED ORDER — ONETOUCH VERIO REFLECT W/DEVICE KIT
1.0000 | PACK | 0 refills | Status: AC
Start: 2024-03-06 — End: ?
  Filled 2024-03-06 – 2024-03-08 (×3): qty 1, 30d supply, fill #0

## 2024-03-06 MED FILL — Alprazolam Tab 0.5 MG: ORAL | 90 days supply | Qty: 180 | Fill #0 | Status: AC

## 2024-03-07 ENCOUNTER — Other Ambulatory Visit: Payer: Self-pay

## 2024-03-08 ENCOUNTER — Other Ambulatory Visit: Payer: Self-pay

## 2024-03-20 ENCOUNTER — Other Ambulatory Visit: Payer: Self-pay

## 2024-03-28 ENCOUNTER — Other Ambulatory Visit: Payer: Self-pay

## 2024-03-30 ENCOUNTER — Other Ambulatory Visit: Payer: Self-pay

## 2024-03-30 DIAGNOSIS — H40153 Residual stage of open-angle glaucoma, bilateral: Secondary | ICD-10-CM | POA: Diagnosis not present

## 2024-03-30 MED ORDER — LATANOPROST 0.005 % OP SOLN
1.0000 [drp] | Freq: Every day | OPHTHALMIC | 6 refills | Status: AC
  Filled 2024-04-26 – 2025-01-11 (×2): qty 2.5, 25d supply, fill #0

## 2024-03-30 MED ORDER — SIMBRINZA 1-0.2 % OP SUSP
1.0000 [drp] | Freq: Two times a day (BID) | OPHTHALMIC | 6 refills | Status: AC
  Filled 2024-03-30 – 2025-01-11 (×3): qty 8, 80d supply, fill #0

## 2024-03-31 ENCOUNTER — Other Ambulatory Visit: Payer: Self-pay

## 2024-04-11 ENCOUNTER — Other Ambulatory Visit: Payer: Self-pay

## 2024-04-26 ENCOUNTER — Other Ambulatory Visit: Payer: Self-pay

## 2024-05-01 DIAGNOSIS — H401134 Primary open-angle glaucoma, bilateral, indeterminate stage: Secondary | ICD-10-CM | POA: Diagnosis not present

## 2024-05-01 DIAGNOSIS — E113212 Type 2 diabetes mellitus with mild nonproliferative diabetic retinopathy with macular edema, left eye: Secondary | ICD-10-CM | POA: Diagnosis not present

## 2024-05-01 DIAGNOSIS — H43813 Vitreous degeneration, bilateral: Secondary | ICD-10-CM | POA: Diagnosis not present

## 2024-05-01 DIAGNOSIS — H34811 Central retinal vein occlusion, right eye, with macular edema: Secondary | ICD-10-CM | POA: Diagnosis not present

## 2024-05-01 LAB — HM DIABETES EYE EXAM

## 2024-05-02 ENCOUNTER — Ambulatory Visit: Payer: Self-pay

## 2024-05-02 ENCOUNTER — Encounter: Payer: Self-pay | Admitting: Family Medicine

## 2024-05-02 ENCOUNTER — Encounter (INDEPENDENT_AMBULATORY_CARE_PROVIDER_SITE_OTHER): Payer: Self-pay

## 2024-05-02 DIAGNOSIS — Z0000 Encounter for general adult medical examination without abnormal findings: Secondary | ICD-10-CM

## 2024-05-02 DIAGNOSIS — E11319 Type 2 diabetes mellitus with unspecified diabetic retinopathy without macular edema: Secondary | ICD-10-CM | POA: Insufficient documentation

## 2024-05-02 DIAGNOSIS — Z1231 Encounter for screening mammogram for malignant neoplasm of breast: Secondary | ICD-10-CM

## 2024-05-02 NOTE — Progress Notes (Signed)
 Subjective:   [name] is a 68 y.o. who presents for a Medicare Wellness preventive visit.  As a reminder, Annual Wellness Visits don't include a physical exam, and some assessments may be limited, especially if this visit is performed virtually. We may recommend an in-person follow-up visit with your provider if needed.  Visit Complete: Virtual I connected with  Simonne Dubonnet on 05/02/24 by a audio enabled telemedicine application and verified that I am speaking with the correct person using two identifiers.  Patient Location: Home  Provider Location: Office/Clinic  I discussed the limitations of evaluation and management by telemedicine. The patient expressed understanding and agreed to proceed.  Vital Signs: Because this visit was a virtual/telehealth visit, some criteria may be missing or patient reported. Any vitals not documented were not able to be obtained and vitals that have been documented are patient reported.  VideoDeclined- This patient declined Librarian, academic. Therefore the visit was completed with audio only.  Persons Participating in Visit: Patient.  AWV Questionnaire: No: Patient Medicare AWV questionnaire was not completed prior to this visit.  Cardiac Risk Factors include: advanced age (>94men, >63 women);dyslipidemia;hypertension;diabetes mellitus;sedentary lifestyle     Objective:     There were no vitals filed for this visit. There is no height or weight on file to calculate BMI.     05/02/2024    8:14 AM 08/06/2023    6:00 AM 08/06/2023   12:14 AM 06/01/2023    9:00 AM 04/28/2023    8:29 AM 05/19/2022    9:24 AM 04/13/2022    8:20 AM  Advanced Directives  Does Patient Have a Medical Advance Directive? No No No No No No No  Would patient like information on creating a medical advance directive? No - Patient declined No - Patient declined  No - Patient declined  No - Patient declined No - Patient declined    Current  Medications (verified) Outpatient Encounter Medications as of 05/02/2024  Medication Sig   acetaminophen  (TYLENOL ) 500 MG tablet Take 2 tablets (1,000 mg total) by mouth every 8 (eight) hours.   ALPRAZolam  (XANAX ) 0.5 MG tablet Take 1 tablet (0.5 mg total) by mouth 2 (two) times daily as needed for anxiety   aspirin  EC 81 MG tablet Take 1 tablet (81 mg total) by mouth daily. Swallow whole.   Blood Glucose Monitoring Suppl (BLOOD GLUCOSE MONITOR SYSTEM) w/Device KIT 1 each by Does not apply route in the morning, at noon, and at bedtime.   Blood Glucose Monitoring Suppl (ONETOUCH VERIO REFLECT) w/Device KIT Use as directed.   Brinzolamide -Brimonidine  (SIMBRINZA ) 1-0.2 % SUSP Place 1 drop into the left eye 2 (two) times daily.   glucose blood (FREESTYLE LITE) test strip Use to check blood sugar up to 4 (four) times daily as needed for diabetes.   latanoprost  (XALATAN ) 0.005 % ophthalmic solution Place 1 drop into both eyes at bedtime.   losartan  (COZAAR ) 100 MG tablet Take 1 tablet (100 mg total) by mouth daily.   metFORMIN  (GLUCOPHAGE -XR) 500 MG 24 hr tablet Take 2 tablets (1,000 mg total) by mouth 2 (two) times daily with a meal.   metoprolol  tartrate (LOPRESSOR ) 50 MG tablet Take 1 tablet (50 mg total) by mouth 2 (two) times daily.   Multiple Vitamin (MULTIVITAMIN) tablet Take 1 tablet by mouth daily.   oxybutynin  (DITROPAN -XL) 10 MG 24 hr tablet Take 1 tablet (10 mg total) by mouth daily.   rosuvastatin  (CRESTOR ) 40 MG tablet Take 1  tablet (40 mg total) by mouth daily.   sertraline  (ZOLOFT ) 25 MG tablet Take 1 tablet by mouth at bedtime.   terbinafine  (LAMISIL ) 250 MG tablet Take 1 tablet (250 mg total) by mouth daily. For fingernail infection   [DISCONTINUED] methylPREDNISolone  (MEDROL ) 4 MG TBPK tablet Take 6 tablets by mouth once daily on Day 1. Then decrease by one tablet daily until complete. (6, 5, 4, 3, 2, 1, then stop). (Patient not taking: Reported on 05/02/2024)   No  facility-administered encounter medications on file as of 05/02/2024.    Allergies (verified) Jardiance  [empagliflozin ] and Penicillins   History: Past Medical History:  Diagnosis Date   Anxiety    Arthritis    CAD (coronary artery disease)    a. 3 vessel CAD noted on coronary CTA 04/2021, s/p CABG x4   Cataract    left   COVID 12/04/2022   Depression    Diabetes mellitus without complication (HCC)    type 2   Environmental and seasonal allergies    GERD (gastroesophageal reflux disease)    Hyperlipidemia    Hypertension    Hypertensive urgency 04/18/2021   Hypoglycemia 06/09/2023   Joint pain    as reported by patient   Papillary fibroelastoma of heart    of the aortic valve,  s/p AVR and tumor resection on 08/04/2021   Right thalamic infarction (HCC) 04/29/2021   S/P AVR (aortic valve replacement)    s/p AVR with resection of AV fibroelastoma on 08/04/2021   S/P CABG x 4    a. s/p CABG x4 LIMA to LAD, SVG to PDA, and Sequential SVG to OM and Diagonal on 8/   Stroke (HCC) 04/2021   ad[facility] - left sided weakness   Urinary incontinence    as stated by patient   Past Surgical History:  Procedure Laterality Date   AORTIC VALVE REPLACEMENT N/A 08/04/2021   Procedure: RESECTION AORTIC VALVE TUMOR;  Surgeon: Zelphia Higashi, MD;  Location: [facility] OR;  Service: Open Heart Surgery;  Laterality: N/A;   APPLICATION OF WOUND VAC N/A 08/24/2021   Procedure: APPLICATION OF WOUND VAC;  Surgeon: Bartley Lightning, MD;  Location: [facility] OR;  Service: Thoracic;  Laterality: N/A;   APPLICATION OF WOUND VAC N/A 08/28/2021   Procedure: WOUND VAC CHANGE;  Surgeon: Zelphia Higashi, MD;  Location: [facility] OR;  Service: Thoracic;  Laterality: N/A;   BREAST EXCISIONAL BIOPSY Right 1999   neg   BUBBLE STUDY  04/24/2021   Procedure: BUBBLE STUDY;  Surgeon: Jacqueline Matsu, MD;  Location: [facility] ENDOSCOPY;  Service: Cardiovascular;;   CATARACT EXTRACTION  12/2011   CATARACT EXTRACTION  W/PHACO Left 12/02/2016   Procedure: CATARACT EXTRACTION PHACO AND INTRAOCULAR LENS PLACEMENT (IOC);  Surgeon: Steven Dingeldein, MD;  Location: [facility] ORS;  Service: Ophthalmology;  Laterality: Left;  US  2:14 AP% 28.4 CDE 59.73 Fluid pack lot # [id number]   COLONOSCOPY WITH PROPOFOL  N/A 04/13/2022   Procedure: COLONOSCOPY WITH PROPOFOL ;  Surgeon: Selena Daily, MD;  Location: [facility] ENDOSCOPY;  Service: Gastroenterology;  Laterality: N/A;   CORONARY ARTERY BYPASS GRAFT N/A 08/04/2021   Procedure: CORONARY ARTERY BYPASS GRAFTING (CABG) X  4  USING LEFT INTERNAL MAMMARY ARTERY AND RIGHT GREATER SAPHENOUS VEIN ENDOSCOPIC CONDUITS;  Surgeon: Zelphia Higashi, MD;  Location: [facility] OR;  Service: Open Heart Surgery;  Laterality: N/A;   DIAGNOSTIC LAPAROSCOPY     EYE SURGERY     HIP ARTHROPLASTY Left 06/02/2023   Procedure: ARTHROPLASTY BIPOLAR  HIP (HEMIARTHROPLASTY);  Surgeon: Rande Bushy, MD;  Location: [facility] ORS;  Service: Orthopedics;  Laterality: Left;   IR CT HEAD LTD  04/19/2021   IR PERCUTANEOUS ART THROMBECTOMY/INFUSION INTRACRANIAL INC DIAG ANGIO  04/19/2021       IR PERCUTANEOUS ART THROMBECTOMY/INFUSION INTRACRANIAL INC DIAG ANGIO  04/19/2021   IR US  GUIDE VASC ACCESS LEFT  04/19/2021   RADIOLOGY WITH ANESTHESIA N/A 04/19/2021   Procedure: IR WITH ANESTHESIA;  Surgeon: Luellen Sages, MD;  Location: [facility] OR;  Service: Radiology;  Laterality: N/A;   STERNAL WOUND DEBRIDEMENT N/A 08/24/2021   Procedure: STERNAL WOUND DRAINAGE AND DEBRIDEMENT;  Surgeon: Bartley Lightning, MD;  Location: [facility] OR;  Service: Thoracic;  Laterality: N/A;   STERNAL WOUND DEBRIDEMENT N/A 08/28/2021   Procedure: STERNAL WOUND DEBRIDEMENT;  Surgeon: Zelphia Higashi, MD;  Location: [facility] OR;  Service: Thoracic;  Laterality: N/A;   TEE WITHOUT CARDIOVERSION N/A 04/24/2021   Procedure: TRANSESOPHAGEAL ECHOCARDIOGRAM (TEE);  Surgeon: Jacqueline Matsu, MD;  Location: [facility] ENDOSCOPY;  Service: Cardiovascular;   Laterality: N/A;   TEE WITHOUT CARDIOVERSION N/A 08/04/2021   Procedure: TRANSESOPHAGEAL ECHOCARDIOGRAM (TEE);  Surgeon: Zelphia Higashi, MD;  Location: [facility] OR;  Service: Open Heart Surgery;  Laterality: N/A;   TOTAL HIP ARTHROPLASTY Right 04/04/2018   Procedure: TOTAL HIP ARTHROPLASTY;  Surgeon: Arlyne Lame, MD;  Location: [facility] ORS;  Service: Orthopedics;  Laterality: Right;   Family History  Problem Relation Age of Onset   Bone cancer Brother    Aneurysm Mother        brain   Diabetes Father    CAD Father    Heart failure Father    Colon cancer Father    Cancer Father    Alzheimer's disease Paternal Grandmother    Dementia Paternal Grandmother    Mental illness Paternal Grandfather    Healthy Daughter    Healthy Son    Breast cancer Neg Hx    Social History   Socioeconomic History   Marital status: Widowed    Spouse name: Not on file   Number of children: 2   Years of education: Not on file   Highest education level: 12th grade  Occupational History    Employer: [employer]  Tobacco Use   Smoking status: Former    Current packs/day: 0.00    Average packs/day: 1.5 packs/day for 25.0 years (37.5 ttl pk-yrs)    Types: Cigarettes    Start date: 03/23/1982    Quit date: 03/24/2007    Years since quitting: 17.1   Smokeless tobacco: Never  Vaping Use   Vaping status: Never Used  Substance and Sexual Activity   Alcohol use: Not Currently   Drug use: No   Sexual activity: Not Currently    Birth control/protection: Post-menopausal  Other Topics Concern   Not on file  Social History Narrative   Not on file   Social Drivers of Health   Financial Resource Strain: Low Risk  (05/02/2024)   Overall Financial Resource Strain (CARDIA)    Difficulty of Paying Living Expenses: Not very hard  Food Insecurity: No Food Insecurity (05/02/2024)   Hunger Vital Sign    Worried About Running Out of Food in the Last Year: Never true    Ran Out of Food in the Last Year: Never  true  Transportation Needs: No Transportation Needs (05/02/2024)   PRAPARE - Administrator, Civil Service (Medical): No    Lack of Transportation (Non-Medical): No  Physical Activity:  Inactive (05/02/2024)   Exercise Vital Sign    Days of Exercise per Week: 0 days    Minutes of Exercise per Session: 0 min  Stress: No Stress Concern Present (05/02/2024)   [employer] of Occupational Health - Occupational Stress Questionnaire    Feeling of Stress : Only a little  Social Connections: Socially Isolated (05/02/2024)   Social Connection and Isolation Panel [NHANES]    Frequency of Communication with Friends and Family: More than three times a week    Frequency of Social Gatherings with Friends and Family: More than three times a week    Attends Religious Services: Never    Database administrator or Organizations: No    Attends Banker Meetings: Never    Marital Status: Widowed    Tobacco Counseling Counseling given: Not Answered    Clinical Intake:  Pre-visit preparation completed: Yes  Pain : No/denies pain     BMI - recorded: 25.2 Nutritional Status: BMI 25 -29 Overweight Nutritional Risks: None Diabetes: Yes CBG done?: No Did pt. bring in CBG monitor from home?: No  Lab Results  Component Value Date   HGBA1C 11.0 (H) 05/31/2023   HGBA1C 10.4 (H) 10/16/2022   HGBA1C 8.1 (H) 03/10/2022     How often do you need to have someone help you when you read instructions, pamphlets, or other written materials from your doctor or pharmacy?: 1 - Never  Interpreter Needed?: No  Information entered by :: Dellie Fergusson, LPN   Activities of Daily Living    05/02/2024    8:16 AM 08/06/2023    6:00 AM  In your present state of health, do you have any difficulty performing the following activities:  Hearing? 0 0  Vision? 1 0  Difficulty concentrating or making decisions? 1 0  Comment FORGETFUL   Walking or climbing stairs? 1 1  Comment NEEDS  HANDRAIL   Dressing or bathing? 0 0  Doing errands, shopping? 0 0  Preparing Food and eating ? N   Using the Toilet? N   In the past six months, have you accidently leaked urine? N   Do you have problems with loss of bowel control? N   Managing your Medications? Y   Managing your Finances? Y   Housekeeping or managing your Housekeeping? Y     Patient Care Team: Lamon Pillow, MD as PCP - General (Family Medicine) Jann Melody, MD as PCP - Cardiology (Cardiology) Bary Boss., MD (Ophthalmology)  Indicate any recent Medical Services you may have received from other than [facility] providers in the past year (date may be approximate).     Assessment:    This is a routine wellness examination for Laurinda.  Hearing/Vision screen Hearing Screening - Comments:: NO AIDS Vision Screening - Comments:: READING, POOR SIGHT- DR.BELL   Goals Addressed             This Visit's Progress    DIET - EAT MORE FRUITS AND VEGETABLES         Depression Screen     05/02/2024    8:12 AM 10/13/2023   10:47 AM 07/20/2023    1:58 PM 04/28/2023    8:23 AM 02/23/2023   11:20 AM 02/10/2023   10:39 AM 10/16/2022    1:18 PM  PHQ 2/9 Scores  PHQ - 2 Score 2 4 6 2 2 6 2   PHQ- 9 Score 3 14 23 3  14 6     Fall Risk  05/02/2024    8:15 AM 07/20/2023    1:58 PM 04/28/2023    8:19 AM 02/23/2023   11:20 AM 02/10/2023   10:38 AM  Fall Risk   Falls in the past year? 1 1 1 1 1   Comment    Last fall in March 2024. No injury.   Number falls in past yr: 0 0 0 1 0  Injury with Fall? 1 1 0 0 0  Risk for fall due to : Impaired vision History of fall(s) No Fall Risks  No Fall Risks  Follow up Falls evaluation completed;Falls prevention discussed Falls evaluation completed Education provided;Falls prevention discussed  Falls evaluation completed    MEDICARE RISK AT HOME:  Medicare Risk at Home Any stairs in or around the home?: Yes If so, are there any without handrails?: No Home free of loose  throw rugs in walkways, pet beds, electrical cords, etc?: Yes Adequate lighting in your home to reduce risk of falls?: Yes Life alert?: No Use of a cane, walker or w/c?: Yes (USING WALKER NOW S/P BROKEN HIP) Grab bars in the bathroom?: No Shower chair or bench in shower?: Yes Elevated toilet seat or a handicapped toilet?: Yes  TIMED UP AND GO:  Was the test performed?  No  Cognitive Function: 6CIT completed        05/02/2024    8:19 AM 04/28/2023    8:40 AM  6CIT Screen  What Year? 0 points 0 points  What month? 0 points 0 points  What time? 0 points 0 points  Count back from 20 0 points 0 points  Months in reverse 0 points 0 points  Repeat phrase 2 points 0 points  Total Score 2 points 0 points    Immunizations Immunization History  Administered Date(s) Administered   Fluad Quad(high Dose 65+) 10/21/2021   Influenza,inj,Quad PF,6+ Mos 08/29/2015   PFIZER(Purple Top)SARS-COV-2 Vaccination 12/26/2019, 01/16/2020, 01/10/2021   Pneumococcal Polysaccharide-23 11/22/2012   Zoster Recombinant(Shingrix) 07/19/2020, 10/25/2020    Screening Tests Health Maintenance  Topic Date Due   DTaP/Tdap/Td (1 - Tdap) Never done   Pneumonia Vaccine 67+ Years old (2 of 2 - PCV) 11/22/2013   OPHTHALMOLOGY EXAM  09/17/2017   COVID-19 Vaccine (4 - 2024-25 season) 08/15/2023   Diabetic kidney evaluation - Urine ACR  10/17/2023   HEMOGLOBIN A1C  11/30/2023   MAMMOGRAM  12/12/2023   INFLUENZA VACCINE  07/14/2024   Diabetic kidney evaluation - eGFR measurement  08/05/2024   Medicare Annual Wellness (AWV)  05/02/2025   Colonoscopy  04/14/2027   DEXA SCAN  05/07/2027   Hepatitis C Screening  Completed   Zoster Vaccines- Shingrix  Completed   HPV VACCINES  Aged Out   Meningococcal B Vaccine  Aged Out    Health Maintenance  Health Maintenance Due  Topic Date Due   DTaP/Tdap/Td (1 - Tdap) Never done   Pneumonia Vaccine 69+ Years old (2 of 2 - PCV) 11/22/2013   OPHTHALMOLOGY EXAM   09/17/2017   COVID-19 Vaccine (4 - 2024-25 season) 08/15/2023   Diabetic kidney evaluation - Urine ACR  10/17/2023   HEMOGLOBIN A1C  11/30/2023   MAMMOGRAM  12/12/2023   Health Maintenance Items Addressed: Mammogram ordered; UP TO DATE ON COLONOSCOPY & BONE DENSITY; NEEDS PNA & TDAP; WANTS NO MORE COVID SHOTS  Additional Screening:  Vision Screening: Recommended annual ophthalmology exams for early detection of glaucoma and other disorders of the eye.  Dental Screening: Recommended annual dental exams for proper oral hygiene  Community Resource Referral / Chronic Care Management: CRR required this visit?  No   CCM required this visit?  No   Plan:    I have personally reviewed and noted the following in the patient's chart:   Medical and social history Use of alcohol, tobacco or illicit drugs  Current medications and supplements including opioid prescriptions. Patient is not currently taking opioid prescriptions. Functional ability and status Nutritional status Physical activity Advanced directives List of other physicians Hospitalizations, surgeries, and ER visits in previous 12 months Vitals Screenings to include cognitive, depression, and falls Referrals and appointments  In addition, I have reviewed and discussed with patient certain preventive protocols, quality metrics, and best practice recommendations. A written personalized care plan for preventive services as well as general preventive health recommendations were provided to patient.   Pinky Bright, LPN   1/61/0960   After Visit Summary: (MyChart) Due to this being a telephonic visit, the after visit summary with patients personalized plan was offered to patient via MyChart   Notes: MAMMOGRAM ORDERED

## 2024-05-02 NOTE — Patient Instructions (Signed)
 [name] , Thank you for taking time out of your busy schedule to complete your Annual Wellness Visit with me. I enjoyed our conversation and look forward to speaking with you again next year. I, as well as your care team,  appreciate your ongoing commitment to your health goals. Please review the following plan we discussed and let me know if I can assist you in the future.  Referrals: If you haven't heard from the office you've been referred to, please reach out to them at the phone provided.  [phone] Follow up Visits: Next Medicare AWV with our clinical staff: 05/09/25 @ 9:30 AM BY PHONE   Have you seen your provider in the last 6 months (3 months if uncontrolled diabetes)? Yes   Clinician Recommendations:  Aim for 30 minutes of exercise or brisk walking, 6-8 glasses of water, and 5 servings of fruits and vegetables each day. TAKE CARE!      This is a list of the screening recommended for you and due dates:  Health Maintenance  Topic Date Due   DTaP/Tdap/Td vaccine (1 - Tdap) Never done   Pneumonia Vaccine (2 of 2 - PCV) 11/22/2013   Eye exam for diabetics  09/17/2017   COVID-19 Vaccine (4 - 2024-25 season) 08/15/2023   Yearly kidney health urinalysis for diabetes  10/17/2023   Hemoglobin A1C  11/30/2023   Mammogram  12/12/2023   Flu Shot  07/14/2024   Yearly kidney function blood test for diabetes  08/05/2024   Medicare Annual Wellness Visit  05/02/2025   Colon Cancer Screening  04/14/2027   DEXA scan (bone density measurement)  05/07/2027   Hepatitis C Screening  Completed   Zoster (Shingles) Vaccine  Completed   HPV Vaccine  Aged Out   Meningitis B Vaccine  Aged Out    Advanced directives: (ACP Link)Information on Advanced Care Planning can be found at [place]  [employer] Advance Health Care Directives Advance Health Care Directives. [email or web address]  Advance Care Planning is important because it:  [x]  Makes sure you receive the medical care that is consistent  with your values, goals, and preferences  [x]  It provides guidance to your family and loved ones and reduces their decisional burden about whether or not they are making the right decisions based on your wishes.  Follow the link provided in your after visit summary or read over the paperwork we have mailed to you to help you started getting your Advance Directives in place. If you need assistance in completing these, please reach out to us  so that we can help you!

## 2024-05-10 ENCOUNTER — Other Ambulatory Visit: Payer: Self-pay

## 2024-06-10 ENCOUNTER — Encounter (HOSPITAL_COMMUNITY): Payer: Self-pay | Admitting: Interventional Radiology

## 2024-07-24 ENCOUNTER — Telehealth: Payer: Self-pay

## 2024-07-24 NOTE — Progress Notes (Signed)
 Pharmacy Quality Measure Review  This patient is appearing on a report for being at risk of failing the adherence measure for cholesterol (statin) and hypertension (ACEi/ARB) medications this calendar year.   Medication: losartan  100 mg  Medication: rosuvastatin  40 mg   Patient reports a recent hip fracture and that she has not been on her medications since returning home. She would like to be seen in clinic again by her provider to obtain refills and verify which medications are still needed.    Phone number for clinic provider. Patient states she will have her daughter call back to reschedule, given she is her transportation for appointments.  Angelyne Terwilliger E. Marsh, PharmD Clinical Pharmacist [facility] [phone]

## 2024-08-02 ENCOUNTER — Other Ambulatory Visit: Payer: Self-pay

## 2024-08-08 ENCOUNTER — Other Ambulatory Visit: Payer: Self-pay

## 2024-09-08 NOTE — Progress Notes (Signed)
 Patient was identified as falling into the [place] Measure - Diabetes.   Patient was last seen in clinic on 10/13/2023. Patient is due for follow up with PCP and updated A1c and UACR.   Will forward to scheduling team.  Chananya Canizalez E. Marsh, PharmD Clinical Pharmacist [facility] [phone]

## 2024-09-15 ENCOUNTER — Ambulatory Visit: Admitting: Family Medicine

## 2024-10-02 ENCOUNTER — Ambulatory Visit: Payer: Self-pay

## 2024-10-02 NOTE — Telephone Encounter (Signed)
 Copied from CRM #[id number]. Topic: Clinical - Red Word Triage >> Oct 02, 2024  4:51 PM Turkey B wrote: Patient hasn't been taking diabetes patient and year and isnt seeing well  [phone] Lauren niece Answer Assessment - Initial Assessment Questions Patient niece reports: Haven't had dm medications for over year and vision is getting worse. Depressed about her finances, water is off. Patient uses walker for ambulation. Blurry vision and has no motivation to care for herself.  Pt's niece Denies confusion and patient is alert and oriented.   1. DESCRIPTION: How has your vision changed? (e.g., complete vision loss, blurred vision, double vision, floaters, etc.)      2. LOCATION: One or both eyes? If one, ask: Which eye?      3. SEVERITY: Can you see anything? If Yes, ask: What can you see? (e.g., fine print)      4. ONSET: When did this begin? Did it start suddenly or has this been gradual?      5. PATTERN: Does this come and go, or has it been constant since it started?      6. PAIN: Is there any pain in your eye(s)?  (Scale 1-10; or mild, moderate, severe)      7. CONTACTS-GLASSES: Do you wear contacts or glasses?      8. CAUSE: What do you think is causing this visual problem?      9. OTHER SYMPTOMS: Do you have any other symptoms? (e.g., confusion, headache, arm or leg weakness, speech problems)  Answer Assessment - Initial Assessment Questions 1. REASON FOR CALL: What is the main reason for your call? or How can I best help you? Patient niece reports: patient Haven't had DM medications for over year and vision is getting worse. Depressed about her finances, water is off. Patient uses walker for ambulation. Blurry vision and has no motivation to care for herself.  Pt's niece Denies pt confusion and patient is alert and oriented.  Patient is not with niece, niece called to schedule appt and will take to appt.  Nurse attempted to contact patient, no  answer.  Scheduled appt 10/03/24.  Advised UC/ED if symptoms.  Protocols used: Vision Loss or Change-A-AH, Information Only Call - No Triage-A-AH

## 2024-10-02 NOTE — Telephone Encounter (Signed)
 Nurse attempted to call patient, patient currently is not with niece. Left vm asking patient to call clinic.

## 2024-10-02 NOTE — Progress Notes (Unsigned)
      Acute visit   Patient: [name]   DOB: 09/08/1956   68 y.o. Female  MRN: [id number] PCP: Gasper Nancyann BRAVO, MD   No chief complaint on file.  Subjective    Discussed the use of AI scribe software for clinical note transcription with the patient, who gave verbal consent to proceed.  History of Present Illness     Review of systems as noted in HPI.   Objective    There were no vitals taken for this visit. Physical Exam    No results found for any visits on 10/03/24.  Assessment & Plan     Problem List Items Addressed This Visit   None   Assessment and Plan Assessment & Plan      No orders of the defined types were placed in this encounter.    No follow-ups on file.      Isaiah DELENA Pepper, MD  [facility] [phone] (phone) [phone] (fax)

## 2024-10-03 ENCOUNTER — Other Ambulatory Visit: Payer: Self-pay

## 2024-10-03 ENCOUNTER — Ambulatory Visit (INDEPENDENT_AMBULATORY_CARE_PROVIDER_SITE_OTHER)

## 2024-10-03 ENCOUNTER — Other Ambulatory Visit (HOSPITAL_COMMUNITY): Payer: Self-pay

## 2024-10-03 VITALS — BP 161/75 | HR 92 | Resp 16 | Ht 67.0 in | Wt 154.0 lb

## 2024-10-03 DIAGNOSIS — E11319 Type 2 diabetes mellitus with unspecified diabetic retinopathy without macular edema: Secondary | ICD-10-CM

## 2024-10-03 DIAGNOSIS — E1165 Type 2 diabetes mellitus with hyperglycemia: Secondary | ICD-10-CM | POA: Diagnosis not present

## 2024-10-03 DIAGNOSIS — Z7984 Long term (current) use of oral hypoglycemic drugs: Secondary | ICD-10-CM | POA: Diagnosis not present

## 2024-10-03 DIAGNOSIS — I10 Essential (primary) hypertension: Secondary | ICD-10-CM

## 2024-10-03 DIAGNOSIS — E785 Hyperlipidemia, unspecified: Secondary | ICD-10-CM | POA: Diagnosis not present

## 2024-10-03 DIAGNOSIS — E1169 Type 2 diabetes mellitus with other specified complication: Secondary | ICD-10-CM | POA: Diagnosis not present

## 2024-10-03 LAB — POCT GLYCOSYLATED HEMOGLOBIN (HGB A1C): Hemoglobin A1C: 10.9 % — AB (ref 4.0–5.6)

## 2024-10-03 MED ORDER — METFORMIN HCL ER 500 MG PO TB24
1000.0000 mg | ORAL_TABLET | Freq: Every day | ORAL | 3 refills | Status: AC
  Filled 2024-10-03: qty 180, 90d supply, fill #0

## 2024-10-03 MED ORDER — BLOOD GLUCOSE MONITOR SYSTEM W/DEVICE KIT
1.0000 | PACK | Freq: Three times a day (TID) | 0 refills | Status: AC
  Filled 2024-10-03: qty 1, 1d supply, fill #0

## 2024-10-03 MED ORDER — LOSARTAN POTASSIUM 100 MG PO TABS
100.0000 mg | ORAL_TABLET | Freq: Every day | ORAL | 3 refills | Status: AC
  Filled 2024-10-03: qty 90, 90d supply, fill #0

## 2024-10-03 MED ORDER — ROSUVASTATIN CALCIUM 40 MG PO TABS
40.0000 mg | ORAL_TABLET | Freq: Every day | ORAL | 3 refills | Status: AC
  Filled 2024-10-03: qty 90, 90d supply, fill #0

## 2024-10-03 NOTE — Telephone Encounter (Signed)
FYI    Patient scheduled to see you today

## 2024-10-03 NOTE — Patient Instructions (Signed)
 [facility] Information  [address] [place], [place]  [address] Phone: [phone] FAX:  [phone]  [facility] accepts and participates in most Eye Care Plans and Health Care Plans. To verify that your Care Provider is included in their list, please call [phone]  [facility] welcomes all new patients! To schedule an appointment, please call [phone] or Toll Free [phone] between 8am and 5pm OR E-mail [email or web address].  After scheduling your first appointment, you will receive you a New Patient Packet in the mail. Please read through all the information in the packet carefully and fill out all the necessary forms.  Tips To [employer] [facility] [employer] First Visit  Come a little early to give time for the Registration process Remember to bring the documents from your New Patient Packet You will need your Insurance Card Bring your photo ID Prepare transportation for after your visit- your eyes will most likely be dilated

## 2024-10-06 ENCOUNTER — Other Ambulatory Visit (HOSPITAL_COMMUNITY): Payer: Self-pay

## 2024-10-11 ENCOUNTER — Ambulatory Visit: Admitting: Family Medicine

## 2024-10-12 NOTE — Progress Notes (Signed)
 [name]                                          MRN: [id number]   10/12/2024   The [facility] Quality Team Specialist reviewed this patient medical record for the purposes of chart review for care gap closure. The following were reviewed: chart review for care gap closure-controlling blood pressure and glycemic status assessment.    [facility] Quality Team

## 2024-10-23 ENCOUNTER — Other Ambulatory Visit (HOSPITAL_BASED_OUTPATIENT_CLINIC_OR_DEPARTMENT_OTHER): Payer: Self-pay

## 2024-12-21 NOTE — Progress Notes (Signed)
 AZA DANTES                                          MRN: [id number]   12/21/2024   The [facility] Quality Team Specialist reviewed this patient medical record for the purposes of chart review for care gap closure. The following were reviewed: chart review for care gap closure-controlling blood pressure and glycemic status assessment.    [facility] Quality Team

## 2025-01-11 ENCOUNTER — Other Ambulatory Visit: Payer: Self-pay

## 2025-05-09 ENCOUNTER — Ambulatory Visit
